# Patient Record
Sex: Male | Born: 1940
Health system: Western US, Academic
[De-identification: ages and names within clinical notes are randomized; demographics above are authoritative.]

## PROBLEM LIST (undated history)

## (undated) DIAGNOSIS — Z972 Presence of dental prosthetic device (complete) (partial): Secondary | ICD-10-CM

## (undated) DIAGNOSIS — R911 Solitary pulmonary nodule: Secondary | ICD-10-CM

## (undated) DIAGNOSIS — K7460 Unspecified cirrhosis of liver: Secondary | ICD-10-CM

## (undated) DIAGNOSIS — J449 Chronic obstructive pulmonary disease, unspecified: Secondary | ICD-10-CM

## (undated) DIAGNOSIS — F419 Anxiety disorder, unspecified: Secondary | ICD-10-CM

## (undated) DIAGNOSIS — F418 Other specified anxiety disorders: Secondary | ICD-10-CM

## (undated) DIAGNOSIS — M1990 Unspecified osteoarthritis, unspecified site: Secondary | ICD-10-CM

## (undated) DIAGNOSIS — C801 Malignant (primary) neoplasm, unspecified: Secondary | ICD-10-CM

## (undated) DIAGNOSIS — E785 Hyperlipidemia, unspecified: Secondary | ICD-10-CM

## (undated) DIAGNOSIS — Z973 Presence of spectacles and contact lenses: Secondary | ICD-10-CM

## (undated) DIAGNOSIS — F1010 Alcohol abuse, uncomplicated: Secondary | ICD-10-CM

## (undated) DIAGNOSIS — I10 Essential (primary) hypertension: Secondary | ICD-10-CM

## (undated) HISTORY — DX: Other specified anxiety disorders: F41.8

## (undated) HISTORY — PX: OTHER SURGICAL HISTORY: SHX169

## (undated) HISTORY — DX: Hyperlipidemia, unspecified: E78.5

## (undated) HISTORY — PX: KNEE SURGERY: SHX244

## (undated) HISTORY — DX: Unspecified cirrhosis of liver: K74.60

## (undated) HISTORY — PX: SKULL FRACTURE ELEVATION: SHX781

## (undated) HISTORY — PX: INGUINAL HERNIA REPAIR: SUR1180

## (undated) HISTORY — DX: Alcohol abuse, uncomplicated: F10.10

## (undated) HISTORY — PX: WISDOM TOOTH EXTRACTION: SHX21

## (undated) HISTORY — PX: EXTERNAL EAR SURGERY: SHX627

## (undated) HISTORY — DX: Chronic obstructive pulmonary disease, unspecified: J44.9

---

## 1994-02-27 HISTORY — PX: COLONOSCOPY: SHX174

## 2003-10-01 ENCOUNTER — Ambulatory Visit (HOSPITAL_COMMUNITY): Admission: RE | Admit: 2003-10-01 | Discharge: 2003-10-01 | Payer: Self-pay | Admitting: Family Medicine

## 2004-07-18 ENCOUNTER — Ambulatory Visit (HOSPITAL_COMMUNITY): Admission: RE | Admit: 2004-07-18 | Discharge: 2004-07-18 | Payer: Self-pay | Admitting: Family Medicine

## 2004-07-22 ENCOUNTER — Ambulatory Visit (HOSPITAL_COMMUNITY): Admission: RE | Admit: 2004-07-22 | Discharge: 2004-07-22 | Payer: Self-pay | Admitting: Family Medicine

## 2004-09-26 ENCOUNTER — Ambulatory Visit (HOSPITAL_COMMUNITY): Admission: RE | Admit: 2004-09-26 | Discharge: 2004-09-26 | Payer: Self-pay | Admitting: Family Medicine

## 2006-04-29 ENCOUNTER — Emergency Department (HOSPITAL_COMMUNITY): Admission: EM | Admit: 2006-04-29 | Discharge: 2006-04-29 | Payer: Self-pay | Admitting: Emergency Medicine

## 2006-05-02 ENCOUNTER — Ambulatory Visit: Payer: Self-pay | Admitting: Orthopedic Surgery

## 2006-05-04 ENCOUNTER — Ambulatory Visit (HOSPITAL_COMMUNITY): Admission: RE | Admit: 2006-05-04 | Discharge: 2006-05-04 | Payer: Self-pay | Admitting: Orthopedic Surgery

## 2006-05-04 ENCOUNTER — Ambulatory Visit: Payer: Self-pay | Admitting: Orthopedic Surgery

## 2006-05-08 ENCOUNTER — Ambulatory Visit: Payer: Self-pay | Admitting: Orthopedic Surgery

## 2006-05-29 ENCOUNTER — Ambulatory Visit: Payer: Self-pay | Admitting: Orthopedic Surgery

## 2006-06-19 ENCOUNTER — Ambulatory Visit: Payer: Self-pay | Admitting: Orthopedic Surgery

## 2006-07-02 ENCOUNTER — Ambulatory Visit: Payer: Self-pay | Admitting: Orthopedic Surgery

## 2006-08-02 ENCOUNTER — Ambulatory Visit: Payer: Self-pay | Admitting: Orthopedic Surgery

## 2006-09-28 ENCOUNTER — Ambulatory Visit (HOSPITAL_COMMUNITY): Admission: RE | Admit: 2006-09-28 | Discharge: 2006-09-28 | Payer: Self-pay | Admitting: Family Medicine

## 2006-11-23 ENCOUNTER — Ambulatory Visit (HOSPITAL_COMMUNITY): Admission: RE | Admit: 2006-11-23 | Discharge: 2006-11-23 | Payer: Self-pay | Admitting: Otolaryngology

## 2008-01-27 ENCOUNTER — Ambulatory Visit (HOSPITAL_COMMUNITY): Admission: RE | Admit: 2008-01-27 | Discharge: 2008-01-27 | Payer: Self-pay | Admitting: Family Medicine

## 2008-01-27 ENCOUNTER — Encounter: Payer: Self-pay | Admitting: Orthopedic Surgery

## 2008-02-10 ENCOUNTER — Ambulatory Visit: Payer: Self-pay | Admitting: Orthopedic Surgery

## 2008-02-10 DIAGNOSIS — M25469 Effusion, unspecified knee: Secondary | ICD-10-CM | POA: Insufficient documentation

## 2008-02-10 DIAGNOSIS — M25569 Pain in unspecified knee: Secondary | ICD-10-CM | POA: Insufficient documentation

## 2008-03-02 ENCOUNTER — Ambulatory Visit: Payer: Self-pay | Admitting: Orthopedic Surgery

## 2008-03-03 ENCOUNTER — Encounter (HOSPITAL_COMMUNITY): Admission: RE | Admit: 2008-03-03 | Discharge: 2008-03-13 | Payer: Self-pay | Admitting: Orthopedic Surgery

## 2008-03-03 ENCOUNTER — Encounter: Payer: Self-pay | Admitting: Orthopedic Surgery

## 2008-03-13 ENCOUNTER — Encounter: Payer: Self-pay | Admitting: Orthopedic Surgery

## 2008-03-18 ENCOUNTER — Ambulatory Visit: Payer: Self-pay | Admitting: Orthopedic Surgery

## 2009-02-22 ENCOUNTER — Ambulatory Visit (HOSPITAL_COMMUNITY): Admission: RE | Admit: 2009-02-22 | Discharge: 2009-02-22 | Payer: Self-pay | Admitting: Family Medicine

## 2009-03-04 ENCOUNTER — Ambulatory Visit (HOSPITAL_COMMUNITY): Admission: RE | Admit: 2009-03-04 | Discharge: 2009-03-04 | Payer: Self-pay | Admitting: Family Medicine

## 2009-03-09 ENCOUNTER — Ambulatory Visit (HOSPITAL_COMMUNITY): Admission: RE | Admit: 2009-03-09 | Discharge: 2009-03-09 | Payer: Self-pay | Admitting: Family Medicine

## 2009-03-12 ENCOUNTER — Ambulatory Visit (HOSPITAL_COMMUNITY): Admission: RE | Admit: 2009-03-12 | Discharge: 2009-03-12 | Payer: Self-pay | Admitting: Family Medicine

## 2009-03-24 ENCOUNTER — Observation Stay (HOSPITAL_COMMUNITY): Admission: RE | Admit: 2009-03-24 | Discharge: 2009-03-25 | Payer: Self-pay | Admitting: General Surgery

## 2009-03-24 ENCOUNTER — Encounter (INDEPENDENT_AMBULATORY_CARE_PROVIDER_SITE_OTHER): Payer: Self-pay | Admitting: General Surgery

## 2010-03-24 ENCOUNTER — Emergency Department (HOSPITAL_COMMUNITY)
Admission: EM | Admit: 2010-03-24 | Discharge: 2010-03-24 | Payer: Self-pay | Source: Home / Self Care | Admitting: Emergency Medicine

## 2010-03-30 NOTE — Miscellaneous (Signed)
Summary: Rehab Report  Rehab Report   Imported By: Elvera Maria 03/20/2008 12:17:00  _____________________________________________________________________  External Attachment:    Type:   Image     Comment:   progress

## 2010-03-30 NOTE — Letter (Signed)
Summary: Out of Work  [facility]  [address]  [place], [place] [address]   Phone: [phone]  Fax: [phone]    February 10, 2008   Employee:  DIRCK BUTCH    To Whom It May Concern:   For Medical reasons, please excuse the above named employee from work for the following dates:  Start:   01/24/2008  End:    03/03/2008  If you need additional information, please feel free to contact our office.         Sincerely,    Terrance Mass, MD

## 2010-03-30 NOTE — Op Note (Signed)
Summary: Op Rpt LTdistal radius fx,Closed reduction, perc pinning, app of  Op Rpt LTdistal radius fx,Closed reduction, perc pinning, app of short-arm splint   Imported By: Cammie Sickle 02/08/2008 21:21:18  _____________________________________________________________________  External Attachment:    Type:   Image     Comment:   External Document

## 2010-03-30 NOTE — Letter (Signed)
Summary: Out of Work  [facility]  [address]  [place], [place] [address]   Phone: [phone]  Fax: [phone]    March 18, 2008   Employee:  JESTON JUNKINS    To Whom It May Concern:   The above named patient may return to work - full duty 03/30/08  If you need further information,please feel free to call our office.      Sincerely,        Dr. [name]

## 2010-03-30 NOTE — Letter (Signed)
Summary: Out of Work  [facility]  [address]  [place], [place] [address]   Phone: [phone]  Fax: [phone]    March 02, 2008   Employee:  Ian Aguilar    To Whom It May Concern:   For Medical reasons, please excuse the above named employee from work for the following dates:  Start:   March 02, 2008   End:   March 18, 2008   Next Scheduled appointment:   March 18, 2008  If you need additional information, please feel free to contact our office.         Sincerely,    Terrance Mass, MD

## 2010-03-30 NOTE — Assessment & Plan Note (Signed)
Summary: 2 WK RECK RT KNEE FOL'G PT RE:RET WORK/MEDICARE,MAILH/CAF    History of Present Illness: I saw Ian Aguilar in the office today for a 2 week  followup visit.  He is a 70 years old man with the complaint of:  right knee.  Patient states his knee is much better.  History:  I saw Ian Aguilar in the office today for a followup visit.  He is a 70 years old man with the complaint of:  return to recheck on the right knee after injection and bracing, we took out of work until 03/03/08.  Fall on 01/24/08.  Contusion of patella.  Still has some tenderness, swelling has went down.  Does not feel like he is ready to return to work, has to work on concrete 10 hrs a day in the cold.  Xrays 01/27/08 of the rt knee [facility].  He apparently fell on  Thanksgiving night when he fell onto his RIGHT knee. He thinks he may have been sleepwalking. He initially had some swelling which has since subsided. He has medial knee pain with associated popping. He has no locking but has pain ascending and descending stairs. The pain is nonradiating and is relieved with some ibuprofen.  OOW since 11/27th        Current Allergies: No known allergies       Physical Exam   The general apperance was normal. The pulses and perfusion were normal with normal color, temperature  and no swelling The coordination and sensation were normal  The reflexes were normal  The knee looks great. Full extension excellent straight leg raise mild quad atrophy. Knee stable.    Impression & Recommendations:  Problem # 1:  JOINT EFFUSION, KNEE (ICD-719.06) Assessment: Improved recommend 2 weeks of rehabilitation and then return to work February 1 follow up as needed after that Orders: Est. Patient Level III ([address])   Problem # 2:  KNEE PAIN (UEA-540.98) Assessment: Improved  Orders: Est. Patient Level III (11914)    Patient Instructions: 1)  Go back to work 03/30/08. 2)  Come back as needed.

## 2010-03-30 NOTE — Progress Notes (Signed)
Summary: Initial Eval  Initial Eval   Imported By: Cammie Sickle 02/08/2008 21:19:38  _____________________________________________________________________  External Attachment:    Type:   Image     Comment:   External Document

## 2010-03-30 NOTE — Assessment & Plan Note (Signed)
Summary: rt knee pain/xrays [email or web address]    History of Present Illness: I saw Ian Aguilar in the office today for a followup visit.  He has a new problem.  He is a 70 years old man with the complaint of:  new problem rt knee pain, consult [facility].  Xrays 01/27/08 of the rt knee [facility].  He apparently fell on  Thanksgiving night when he fell onto his RIGHT knee. He thinks he may have been sleepwalking. He initially had some swelling which has since subsided. He has medial knee pain with associated popping. He has no locking but has pain ascending and descending stairs. The pain is nonradiating and is relieved with some ibuprofen.  OOW since 11/27th        Current Allergies: No known allergies   Past Medical History:    Reviewed history from 02/07/2008 and no changes required:       Unsteady walking       Vertigo       Seasonal allergies       Cough  Past Surgical History:    Reviewed history from 02/07/2008 and no changes required:       Broken cheek 1967       Ear surgery 1976       LEFT Wrist surgery: closed reduction percutaneous pinning   Social History:    JOB: prefinish hardwood flooring     Review of Systems  MS      fracture LEFT wrist treated with internal fixation K wires and closed reduction; doing well    Knee Exam  General:    Well-developed, well-nourished, normal body habitus; no deformities, normal grooming.  Gait:    Normal heel-toe gait pattern bilaterally.    Skin:    Intact, no scars, lesions, rashes, cafe au lait spots, or bruising.    Inspection:    swelling:RIGHT knee  Palpation:    tenderness over the medial joint line and the medial parapatellar region  Vascular:    There was no swelling or varicose veins. The pulses and temperature are normal. There was no edema or tenderness.  Sensory:    Gross coordination and sensation were normal.    Motor:    Motor strength 5/5 bilaterally for quadriceps,  hamstrings, ankle dorsiflexion, and ankle plantar flexion.    Reflexes:    Normal and symmetric patellar and Achilles reflexes bilaterally.    Knee Exam:    Right:    Inspection:  Abnormal    Palpation:  Abnormal    Stability:  stable     His range of motion was 5 - 125; the meniscal sign was equivocal    Impression & Recommendations:  Problem # 1:  KNEE PAIN (ICD-719.46) The x-rays were done at [facility]. The report and the films have been reviewed. there is a joint effusion there is mild degenerative arthritis especially on the medial side  Impression osteoarthritis with joint effusion   The RIGHT knee Verbal consent was obtained. The knee was prepped with alcohol and ethyl chloride. 1 cc of depomedrol 40mg /cc and 4 cc of lidocaine 1% was injected. there were no complications. aspiration of 20 cc of blood-tinged fluid prior to injection  I want him to apply a RIGHT knee sleeve for compression.  This was most likely a patellar contusion Orders: Est. Patient Level IV ([address]) Depo- Medrol 40mg  (J1030) Joint Aspirate / Injection, Large (20610)   Problem # 2:  JOINT EFFUSION, KNEE (ICD-719.06)  Orders: Est. Patient Level IV (60454) Depo-  Medrol 40mg  (J1030) Joint Aspirate / Injection, Large (20610)    Patient Instructions: 1)  You have received an injection of cortisone today. You may experience increased pain at the injection site. Apply ice pack to the area for 20 minutes every 2 hours and take 2 xtra strength tylenol every 8 hours. This increased pain will usually resolve in 24 hours. The injection will take effect in 3-10 days.  2)  Wear the knee brace for daily activity x 4 weeks  3)  DX: Knee Effusion and Patella contusion 4)  OOW 01/24/08 to Mar 02 2008 5)  RTW Jan 5 th 2010   ]

## 2010-03-30 NOTE — Assessment & Plan Note (Signed)
Summary: RE-CK RT KNEE RE: RET TO WORK/MEDICARE,MAILH/CAF    History of Present Illness: I saw Ian Aguilar in the office today for a followup visit.  He is a 70 years old man with the complaint of:  return to recheck on the right knee after injection and bracing, we took out of work until 03/03/08.  Fall on 01/24/08.  Contusion of patella.  Still has some tenderness, swelling has went down.  Does not feel like he is ready to return to work, has to work on concrete 10 hrs a day in the cold.  Xrays 01/27/08 of the rt knee [facility].  He apparently fell on  Thanksgiving night when he fell onto his RIGHT knee. He thinks he may have been sleepwalking. He initially had some swelling which has since subsided. He has medial knee pain with associated popping. He has no locking but has pain ascending and descending stairs. The pain is nonradiating and is relieved with some ibuprofen.  OOW since 11/27th        Current Allergies: No known allergies       Physical Exam  Msk:     normal  Pulses:     pulses normal in all 4 extremities Extremities:     the RIGHT knee is weak compared to the LEFT there is poor quadriceps extension and poor straight leg raise, stability normal, range of motion normal. No joint effusion. Neurologic:     normal sensation    Impression & Recommendations:  Problem # 1:  JOINT EFFUSION, KNEE (ICD-719.06) Assessment: Unchanged  Orders: Physical Therapy Referral (PT) Est. Patient Level III (57846)   Problem # 2:  KNEE PAIN (NGE-952.84) Assessment: Unchanged  Orders: Physical Therapy Referral (PT) Est. Patient Level III (13244)  she should have some physical therapy to strengthen his knee 4 days a week for 2 weeks and return to stay out of work for now.   Patient Instructions: 1)  OOW for 2 more weeks 2)  PT 4 times a week for 2 weeks. 3)  Come back in 2 weeks for recheck of the right knee.   ]

## 2010-03-30 NOTE — Miscellaneous (Signed)
Summary: PT Initial eval  PT Initial eval   Imported By: Cammie Sickle 03/13/2008 19:01:20  _____________________________________________________________________  External Attachment:    Type:   Image     Comment:   External Document

## 2010-04-01 NOTE — Progress Notes (Signed)
Summary: Office Visit 07/02/06  Office Visit 07/02/06   Imported By: Cammie Sickle 02/08/2008 21:21:59  _____________________________________________________________________  External Attachment:    Type:   Image     Comment:   External Document

## 2010-05-15 LAB — BASIC METABOLIC PANEL
BUN: 16 mg/dL (ref 6–23)
CO2: 30 mEq/L (ref 19–32)
Calcium: 8.8 mg/dL (ref 8.4–10.5)
Chloride: 98 mEq/L (ref 96–112)
Creatinine, Ser: 0.59 mg/dL (ref 0.4–1.5)
GFR calc Af Amer: >60 mL/min (ref >60)
GFR calc non Af Amer: >60 mL/min (ref >60)
Glucose, Bld: 98 mg/dL (ref 70–99)
Potassium: 4.1 mEq/L (ref 3.5–5.1)
Sodium: 131 mEq/L — ABNORMAL LOW (ref 135–145)

## 2010-05-15 LAB — HEPATIC FUNCTION PANEL
ALT: 60 U/L — ABNORMAL HIGH (ref 0–53)
AST: 81 U/L — ABNORMAL HIGH (ref 0–37)
Albumin: 3.1 g/dL — ABNORMAL LOW (ref 3.5–5.2)
Alkaline Phosphatase: 118 U/L — ABNORMAL HIGH (ref 39–117)
Bilirubin, Direct: 0.3 mg/dL (ref 0.0–0.3)
Indirect Bilirubin: 0.7 mg/dL (ref 0.3–0.9)
Total Bilirubin: 1 mg/dL (ref 0.3–1.2)
Total Protein: 5.9 g/dL — ABNORMAL LOW (ref 6.0–8.3)

## 2010-05-15 LAB — CBC
HCT: 40 % (ref 39.0–52.0)
Hemoglobin: 13.9 g/dL (ref 13.0–17.0)
MCHC: 34.7 g/dL (ref 30.0–36.0)
MCV: 102 fL — ABNORMAL HIGH (ref 78.0–100.0)
Platelets: 176 10*3/uL (ref 150–400)
RBC: 3.93 MIL/uL — ABNORMAL LOW (ref 4.22–5.81)
RDW: 15.1 % (ref 11.5–15.5)
WBC: 5.9 10*3/uL (ref 4.0–10.5)

## 2010-05-15 LAB — DIFFERENTIAL
Basophils Absolute: 0 10*3/uL (ref 0.0–0.1)
Basophils Relative: 0 % (ref 0–1)
Eosinophils Absolute: 0 10*3/uL (ref 0.0–0.7)
Eosinophils Relative: 1 % (ref 0–5)
Lymphocytes Relative: 22 % (ref 12–46)
Lymphs Abs: 1.3 10*3/uL (ref 0.7–4.0)
Monocytes Absolute: 0.4 10*3/uL (ref 0.1–1.0)
Monocytes Relative: 7 % (ref 3–12)
Neutro Abs: 4.1 10*3/uL (ref 1.7–7.7)
Neutrophils Relative %: 70 % (ref 43–77)

## 2010-05-15 LAB — APTT: aPTT: 28 seconds (ref 24–37)

## 2010-05-15 LAB — PROTIME-INR
INR: 1.07 (ref 0.00–1.49)
Prothrombin Time: 13.8 seconds (ref 11.6–15.2)

## 2010-05-15 LAB — BLEEDING TIME: Bleeding Time: 5 minutes (ref 2.5–9.5)

## 2010-07-15 NOTE — Op Note (Signed)
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  Radiographs revealed a distal radius fracture with dorsal angulation.  It was recommended that he undergo closed reduction percutaneous  pinning.  Informed consent process was completed.   PREOPERATIVE DIAGNOSIS:  Closed left distal radius fracture.   POSTOPERATIVE DIAGNOSIS:  Closed left distal radius fracture.   PROCEDURE:  Closed reduction, percutaneous pinning, application of short-  arm splint.   SURGEON:  Dr. Romeo Apple, no assistants.   ANESTHETIC:  General.   No blood loss or specimen.   COMPLICATIONS:  None.   Counts were correct.  The patient to PACU in good condition.   PROCEDURE DETAILS:  The patient was identified as [name].  Left  wrist was marked for surgery, countersigned by the surgeon.  History and  physical was updated.  The patient was taken to the operating room for  general anesthesia.  Antibiotics were started.  Left upper extremity was  prepped and draped using sterile technique.   Time-out procedure was completed.  Limb site was confirmed.  Antibiotics  confirmed to be within an hour of skin incision.   Closed manipulation was attempted unsuccessfully.  The patient was  placed in traction and then second manipulation reduced the fracture.  Four K-wires were placed, two 4.5 and two 6.2.  These were buried under  the skin to be removed at later time.  Radiographs confirmed an  excellent reduction.  The patient was placed in a volar splint,  extubated and taken to recovery  room in stable condition.      Vickki Hearing, M.D.  Electronically Signed     [facility]/MEDQ  D:  05/04/2006  T:  05/04/2006  Job:  [id number]

## 2011-06-07 ENCOUNTER — Telehealth: Payer: Self-pay

## 2011-06-07 ENCOUNTER — Other Ambulatory Visit: Payer: Self-pay

## 2011-06-07 DIAGNOSIS — Z139 Encounter for screening, unspecified: Secondary | ICD-10-CM

## 2011-06-07 NOTE — Telephone Encounter (Signed)
Needs ov to determine means of sedation.

## 2011-06-07 NOTE — Telephone Encounter (Signed)
Previous old report where Dr. Karilyn Cota did his colonoscopy 10/17/94 indicated he drank alcohol. i called pt and he said he does drink 6-8 beers daily.

## 2011-06-07 NOTE — Telephone Encounter (Signed)
LM for pt to returncall

## 2011-06-07 NOTE — Telephone Encounter (Signed)
Gastroenterology Pre-Procedure Form   Request Date: 06/07/2011      Requesting Physician: Dr. Phillips Odor     PATIENT INFORMATION:  Ian Aguilar is a 71 y.o., male (DOB=1940/07/08).  PROCEDURE: Procedure(s) requested: colonoscopy Procedure Reason: screening for colon cancer  PATIENT REVIEW QUESTIONS: The patient reports the following:   1. Diabetes Melitis: no 2. Joint replacements in the past 12 months: no 3. Major health problems in the past 3 months: no 4. Has an artificial valve or MVP:no 5. Has been advised in past to take antibiotics in advance of a procedure like teeth cleaning: no}    MEDICATIONS & ALLERGIES:    Patient reports the following regarding taking any blood thinners:   Plavix? no Aspirin?no Coumadin?  no  Patient confirms/reports the following medications:  Current Outpatient Prescriptions  Medication Sig Dispense Refill  . ALPRAZolam (XANAX) 0.5 MG tablet Take 0.5 mg by mouth at bedtime as needed. Pt takes 1/2 tablet most nights      . buPROPion (WELLBUTRIN XL) 150 MG 24 hr tablet Take 150 mg by mouth 1 day or 1 dose.      . ibuprofen (ADVIL,MOTRIN) 200 MG tablet Take 200 mg by mouth 1 day or 1 dose.      . tiotropium (SPIRIVA) 18 MCG inhalation capsule Place 18 mcg into inhaler and inhale daily.        Patient confirms/reports the following allergies:  No Known Allergies  Patient is appropriate to schedule for requested procedure(s): yes  AUTHORIZATION INFORMATION Primary Insurance:   ID #:  Group #:  Pre-Cert / Auth required:  Pre-Cert / Auth #:   Secondary Insurance:   ID #:   Group #:  Pre-Cert / Auth required: Pre-Cert / Auth #:   No orders of the defined types were placed in this encounter.    SCHEDULE INFORMATION: Procedure has been scheduled as follows:  Date: 06/28/2011        Time: 10:00 AM Location: [facility] Short Stay  This Gastroenterology Pre-Precedure Form is being routed to the following provider(s) for review: R.  Roetta Sessions, MD

## 2011-06-08 NOTE — Telephone Encounter (Signed)
LMOM for Ian Aguilar. Cancel appt on 06/28/2011 for the colonoscopy.

## 2011-06-08 NOTE — Telephone Encounter (Signed)
Called and spoke with wife. OV scheduled for 06/12/2011 @ 2:00 PM with Gerrit Halls, NP.

## 2011-06-12 ENCOUNTER — Ambulatory Visit (INDEPENDENT_AMBULATORY_CARE_PROVIDER_SITE_OTHER): Payer: Medicare Other | Admitting: Gastroenterology

## 2011-06-12 ENCOUNTER — Encounter: Payer: Self-pay | Admitting: Gastroenterology

## 2011-06-12 DIAGNOSIS — R160 Hepatomegaly, not elsewhere classified: Secondary | ICD-10-CM

## 2011-06-12 DIAGNOSIS — Z1211 Encounter for screening for malignant neoplasm of colon: Secondary | ICD-10-CM

## 2011-06-12 MED ORDER — PEG-KCL-NACL-NASULF-NA ASC-C 100 G PO SOLR: 1.0000 | Freq: Once | ORAL | Status: DC

## 2011-06-12 NOTE — Progress Notes (Signed)
 Primary Care Physician:  GOLDING,JOHN CABOT, MD, MD Primary Gastroenterologist:  Dr. Rourk   Chief Complaint  Patient presents with  . Colonoscopy    HPI:   71-year-old male who presents today at the request of Dr. Golding for a screening TCS. Apparently last one performed by Dr. Rehman in 1996, do not have reports at time of visit. Denies any abdominal pain, N/V, no upper GI symptoms. No blood in stool. No constipation, diarrhea. No wt loss or lack of appetite.   Upon review of office note from Dr. Golding, appears patient may have hx of cirrhosis. However, he states he is unaware of this. States was told his "liver was big".   Dec 2010 US with hepatomegaly.  Aug 2008 CT: enlarged lobular liver, nonspecific, can be seen with cirrhosis and other etiologies, no focal mass lesion identified. Appeared relatively stable from prior CT of 2005.  I do not have current labs at time of visit. Appears Dr. Golding ordered routine labs to include CBC, CMP. Will need to request these.   Last HFP on file from 2011 with AST 81, ALT 60, AP 118.    Past Medical History  Diagnosis Date  . COPD (chronic obstructive pulmonary disease)   . ETOH abuse   . Cirrhosis     pt unsure  . Hyperlipidemia   . Depression with anxiety     Past Surgical History  Procedure Date  . Knee surgery     left  . Inguinal hernia repair   . Skull fracture elevation   . Broken arm   . External ear surgery   . Wisdom tooth extraction   . Bilateral cataract surgery     Current Outpatient Prescriptions  Medication Sig Dispense Refill  . ALPRAZolam (XANAX) 0.5 MG tablet Take 0.5 mg by mouth at bedtime as needed. Pt takes 1/2 tablet most nights      . buPROPion (WELLBUTRIN XL) 150 MG 24 hr tablet Take 150 mg by mouth 1 day or 1 dose.      . ibuprofen (ADVIL,MOTRIN) 200 MG tablet Take 200 mg by mouth 1 day or 1 dose.      . tiotropium (SPIRIVA) 18 MCG inhalation capsule Place 18 mcg into inhaler and inhale daily.       . peg 3350 powder (MOVIPREP) SOLR Take 1 kit (100 g total) by mouth once. As directed Please purchase 1 Fleets enema to use with the prep  1 kit  0    Allergies as of 06/12/2011  . (No Known Allergies)    Family History  Problem Relation Age of Onset  . Colon cancer Neg Hx     History   Social History  . Marital Status: Married    Spouse Name: N/A    Number of Children: N/A  . Years of Education: N/A   Occupational History  . retired    Social History Main Topics  . Smoking status: Current Everyday Smoker -- 0.5 packs/day    Types: Cigarettes  . Smokeless tobacco: Not on file   Comment: 4-10 a day  . Alcohol Use: Yes      beer only. 6-8 bottles per day.   . Drug Use: No  . Sexually Active: Not on file   Other Topics Concern  . Not on file   Social History Narrative  . No narrative on file    Review of Systems: Gen: Denies any fever, chills, fatigue, weight loss, lack of appetite.  CV: Denies chest   pain, heart palpitations, peripheral edema, syncope.  Resp: Denies shortness of breath at rest or with exertion. Denies wheezing or cough.  GI: Denies dysphagia or odynophagia. Denies jaundice, hematemesis, fecal incontinence. GU : Denies urinary burning, urinary frequency, urinary hesitancy MS: Denies joint pain, muscle weakness, cramps, or limitation of movement.  Derm: Denies rash, itching, dry skin Psych: Denies depression, anxiety, memory loss, and confusion Heme: Denies bruising, bleeding, and enlarged lymph nodes.  Physical Exam: BP 164/77  Pulse 81  Temp(Src) 98.3 F (36.8 C) (Temporal)  Ht 5' 11" (1.803 m)  Wt 162 lb (73.483 kg)  BMI 22.59 kg/m2 General:   Alert and oriented. Pleasant and cooperative. Well-nourished and well-developed.  Head:  Normocephalic and atraumatic. Eyes:  Without icterus, sclera clear and conjunctiva pink.  Ears:  Normal auditory acuity. Nose:  No deformity, discharge,  or lesions. Mouth:  No deformity or lesions, oral  mucosa pink.  Neck:  Supple, without mass or thyromegaly. Lungs:  Clear to auscultation bilaterally. No wheezes, rales, or rhonchi. No distress.  Heart:  S1, S2 present without murmurs appreciated.  Abdomen:  +BS, soft, non-tender and non-distended. Liver border easily palpable. No guarding or rebound. No masses appreciated.  Rectal:  Deferred  Msk:  Symmetrical without gross deformities. Normal posture. Extremities:  Without clubbing or edema. Neurologic:  Alert and  oriented x4;  grossly normal neurologically. Skin:  Intact without significant lesions or rashes. Cervical Nodes:  No significant cervical adenopathy. Psych:  Alert and cooperative. Normal mood and affect.    

## 2011-06-12 NOTE — Patient Instructions (Signed)
We have set you up for an ultrasound of your belly due to your enlarged liver. It is very important that you cut back on drinking, but it is even better if you quit altogether :)  We will be calling you once we review the labs from Dr. Phillips Odor. We most likely will need more blood work.   We have set you up for a colonoscopy and possibly an upper endoscopy with Dr. Jena Gauss in the very near future.  Please call us if you have any questions in the meantime.

## 2011-06-13 ENCOUNTER — Ambulatory Visit (HOSPITAL_COMMUNITY)
Admission: RE | Admit: 2011-06-13 | Discharge: 2011-06-13 | Disposition: A | Discharge status: Home / Self Care | Payer: Medicare Other | Source: Ambulatory Visit | Attending: Gastroenterology | Admitting: Gastroenterology

## 2011-06-13 DIAGNOSIS — R160 Hepatomegaly, not elsewhere classified: Secondary | ICD-10-CM | POA: Insufficient documentation

## 2011-06-14 ENCOUNTER — Encounter: Payer: Self-pay | Admitting: Gastroenterology

## 2011-06-14 DIAGNOSIS — R160 Hepatomegaly, not elsewhere classified: Secondary | ICD-10-CM | POA: Insufficient documentation

## 2011-06-14 NOTE — Assessment & Plan Note (Addendum)
Palpable liver on exam. Pt with unclear history of cirrhosis, yet referring notes document this in office visit. Review of radiology procedures in past with [place] of abd 2010 with hepatomegaly, CT 2008 with enlarged lobular liver, question of cirrhosis. Last LFTs on file through epic in 2011 as mentioned above, AST, ALT, AP mildly elevated. Question evolving/early cirrhosis secondary to ETOH. Unable to exclude other factors at this time. Need labs from Dr. Phillips Odor, updated [place] of abdomen, consider EGD at time of TCS if findings of cirrhosis on [place]. I briefly discussed in detail with patient the need for further investigation. Highly anticipate further work-up in near future after review of blood work and [place]. Discussed possibly adding EGD at time of TCS to evaluate for esophageal varices. Pt states understanding.   ADDENDUM 4/30: CIRRHOSIS NOTED ON CT, UNCLEAR ETIOLOGY. ALSO NOTED 2 AREAS IN PANCREAS THAT NEED FURTHER EVALUATION WITH MRI. PT WILL BE SCHEDULED TO FOLLOW-UP WITH [place] NEXT WEEK TO FURTHER WORK-UP CIRRHOSIS, DECIDE MODALITY WITH WHICH TO EVALUATE PANCREAS.   ~Proceed with upper endoscopy at time of TCS with Dr. Jena Gauss to assess for esophageal varices. The risks, benefits, and alternatives have been discussed in detail with patient. They have stated understanding and desire to proceed.  Phenergan 12.5 mg on call.

## 2011-06-14 NOTE — Assessment & Plan Note (Addendum)
71 year old male with need for updated TCS. Last performed in the 1990s with Dr. Karilyn Cota, need op notes. No lower GI symptoms. No rectal bleeding or abdominal pain. Drinks 6-8 beers per day; therefore, we will facilitate sedation with modified dose of Phenergan 12.5 mg due to age at time of procedure.   Proceed with TCS with Dr. Jena Gauss in near future: the risks, benefits, and alternatives have been discussed with the patient in detail. The patient states understanding and desires to proceed. Phenergan 12.5 mg ON CALL Retrieve prior TCS for records (Received op notes from 1996, Dr. Karilyn Cota, external hemorrhoids otherwise normal. )

## 2011-06-15 NOTE — Progress Notes (Signed)
Faxed to PCP

## 2011-06-19 ENCOUNTER — Telehealth: Payer: Self-pay | Admitting: Gastroenterology

## 2011-06-19 ENCOUNTER — Encounter: Payer: Self-pay | Admitting: Gastroenterology

## 2011-06-19 ENCOUNTER — Other Ambulatory Visit: Payer: Self-pay | Admitting: Gastroenterology

## 2011-06-19 MED ORDER — PEG 3350-KCL-NA BICARB-NACL 420 G PO SOLR: ORAL | Status: AC

## 2011-06-19 NOTE — Telephone Encounter (Signed)
Received fax from [facility] requesting a less expensive prep for the pt- Prep changed to [facility]- Rx abd instructions faxed to [facility] [place]

## 2011-06-20 ENCOUNTER — Encounter: Payer: Self-pay | Admitting: Gastroenterology

## 2011-06-20 NOTE — Progress Notes (Signed)
Received op notes from 1996, Dr. Karilyn Cota, external hemorrhoids otherwise normal.

## 2011-06-23 NOTE — Progress Notes (Signed)
Quick Note:  Question chronic liver disease.  Let's get CT abd/pelvis Needs Creatinine prior. Let's try to get this several days prior to TCS (May 1st) if possible. May need EGD to assess for varices at time of TCS if +cirrhosis. ______

## 2011-06-26 ENCOUNTER — Other Ambulatory Visit: Payer: Self-pay | Admitting: Gastroenterology

## 2011-06-26 ENCOUNTER — Telehealth: Payer: Self-pay | Admitting: Gastroenterology

## 2011-06-26 ENCOUNTER — Encounter: Payer: Self-pay | Admitting: Internal Medicine

## 2011-06-26 DIAGNOSIS — K769 Liver disease, unspecified: Secondary | ICD-10-CM

## 2011-06-26 NOTE — Telephone Encounter (Signed)
Pt called back he is aware of the CT - to get blood work and pick up contrast this afternoon

## 2011-06-27 ENCOUNTER — Ambulatory Visit (HOSPITAL_COMMUNITY)
Admission: RE | Admit: 2011-06-27 | Discharge: 2011-06-27 | Disposition: A | Discharge status: Home / Self Care | Payer: Medicare Other | Source: Ambulatory Visit | Attending: Gastroenterology | Admitting: Gastroenterology

## 2011-06-27 ENCOUNTER — Encounter (HOSPITAL_COMMUNITY): Payer: Self-pay | Admitting: Pharmacy Technician

## 2011-06-27 DIAGNOSIS — R935 Abnormal findings on diagnostic imaging of other abdominal regions, including retroperitoneum: Secondary | ICD-10-CM | POA: Insufficient documentation

## 2011-06-27 DIAGNOSIS — K769 Liver disease, unspecified: Secondary | ICD-10-CM

## 2011-06-27 LAB — CREATININE, SERUM: Creat: 0.89 mg/dL (ref 0.50–1.35)

## 2011-06-27 MED ORDER — IOHEXOL 300 MG/ML  SOLN
100.0000 mL | Freq: Once | INTRAMUSCULAR | Status: AC | PRN
  Administered 2011-06-27: 100 mL via INTRAVENOUS

## 2011-06-27 NOTE — Progress Notes (Signed)
Quick Note:  He does have cirrhosis.  Unknown etiology at this point. No recent HFP to review.  Proceed with adding a definite EGD on to TCS on 5/1. (this was "possible" before, now definite).   Follow-up next week in our office to review labs needed and further evaluate cirrhosis.  Also, concern for 2 low density structures in head of pancreas. Will need to set up MRI likely. This will be discussed at his OV next week. Important he follows up for further evaluation. ______

## 2011-06-27 NOTE — Progress Notes (Signed)
Still awaiting recent labs from Dr. Phillips Odor. This will not interfere with his procedure 5/1.   However, we need to get these on file so we know what is left to order. Undergoing CT 4/30 to assess for cirrhosis. Notes from PCP elude to this. Will ultimately need to get on the 6 mos path for AFP, [place] if +cirrhosis. Needs HFP, CBC, BMP at minimum from Dr. Phillips Odor. If these are not available, will need to draw ourselves.

## 2011-06-27 NOTE — Progress Notes (Signed)
Faxed to PCP

## 2011-06-28 ENCOUNTER — Encounter (HOSPITAL_COMMUNITY): Payer: Self-pay | Admitting: *Deleted

## 2011-06-28 ENCOUNTER — Ambulatory Visit (HOSPITAL_COMMUNITY)
Admission: RE | Admit: 2011-06-28 | Discharge: 2011-06-28 | Disposition: A | Discharge status: Home / Self Care | Payer: Medicare Other | Source: Ambulatory Visit | Attending: Internal Medicine | Admitting: Internal Medicine

## 2011-06-28 ENCOUNTER — Encounter (HOSPITAL_COMMUNITY)
Admission: RE | Disposition: A | Discharge status: Home / Self Care | Payer: Self-pay | Source: Ambulatory Visit | Attending: Internal Medicine

## 2011-06-28 ENCOUNTER — Ambulatory Visit: Admit: 2011-06-28 | Payer: Self-pay | Admitting: Internal Medicine

## 2011-06-28 DIAGNOSIS — R160 Hepatomegaly, not elsewhere classified: Secondary | ICD-10-CM

## 2011-06-28 DIAGNOSIS — Z1211 Encounter for screening for malignant neoplasm of colon: Secondary | ICD-10-CM

## 2011-06-28 DIAGNOSIS — K2289 Other specified disease of esophagus: Secondary | ICD-10-CM

## 2011-06-28 DIAGNOSIS — D128 Benign neoplasm of rectum: Secondary | ICD-10-CM | POA: Insufficient documentation

## 2011-06-28 DIAGNOSIS — K228 Other specified diseases of esophagus: Secondary | ICD-10-CM

## 2011-06-28 DIAGNOSIS — J449 Chronic obstructive pulmonary disease, unspecified: Secondary | ICD-10-CM | POA: Insufficient documentation

## 2011-06-28 DIAGNOSIS — K5730 Diverticulosis of large intestine without perforation or abscess without bleeding: Secondary | ICD-10-CM

## 2011-06-28 DIAGNOSIS — E785 Hyperlipidemia, unspecified: Secondary | ICD-10-CM | POA: Insufficient documentation

## 2011-06-28 DIAGNOSIS — K449 Diaphragmatic hernia without obstruction or gangrene: Secondary | ICD-10-CM | POA: Insufficient documentation

## 2011-06-28 DIAGNOSIS — K769 Liver disease, unspecified: Secondary | ICD-10-CM | POA: Insufficient documentation

## 2011-06-28 DIAGNOSIS — D126 Benign neoplasm of colon, unspecified: Secondary | ICD-10-CM

## 2011-06-28 DIAGNOSIS — D129 Benign neoplasm of anus and anal canal: Secondary | ICD-10-CM

## 2011-06-28 DIAGNOSIS — K2940 Chronic atrophic gastritis without bleeding: Secondary | ICD-10-CM | POA: Insufficient documentation

## 2011-06-28 DIAGNOSIS — J4489 Other specified chronic obstructive pulmonary disease: Secondary | ICD-10-CM | POA: Insufficient documentation

## 2011-06-28 DIAGNOSIS — K2960 Other gastritis without bleeding: Secondary | ICD-10-CM

## 2011-06-28 HISTORY — PX: COLONOSCOPY: SHX5424

## 2011-06-28 HISTORY — PX: ESOPHAGOGASTRODUODENOSCOPY: SHX5428

## 2011-06-28 SURGERY — COLONOSCOPY
Anesthesia: Moderate Sedation

## 2011-06-28 MED ORDER — STERILE WATER FOR IRRIGATION IR SOLN
Status: DC | PRN
  Administered 2011-06-28: 13:00:00

## 2011-06-28 MED ORDER — MIDAZOLAM HCL 5 MG/5ML IJ SOLN
INTRAMUSCULAR | Status: DC | PRN
  Administered 2011-06-28: 2 mg via INTRAVENOUS
  Administered 2011-06-28: 1 mg via INTRAVENOUS

## 2011-06-28 MED ORDER — BUTAMBEN-TETRACAINE-BENZOCAINE 2-2-14 % EX AERO
INHALATION_SPRAY | CUTANEOUS | Status: DC | PRN
  Administered 2011-06-28: 1 via TOPICAL

## 2011-06-28 MED ORDER — MIDAZOLAM HCL 5 MG/5ML IJ SOLN
INTRAMUSCULAR | Status: AC
  Filled 2011-06-28: qty 10

## 2011-06-28 MED ORDER — PROMETHAZINE HCL 25 MG/ML IJ SOLN
12.5000 mg | Freq: Once | INTRAMUSCULAR | Status: AC
  Administered 2011-06-28: 12.5 mg via INTRAVENOUS

## 2011-06-28 MED ORDER — PROMETHAZINE HCL 25 MG/ML IJ SOLN
INTRAMUSCULAR | Status: AC
  Filled 2011-06-28: qty 1

## 2011-06-28 MED ORDER — SODIUM CHLORIDE 0.45 % IV SOLN
Freq: Once | INTRAVENOUS | Status: AC
  Administered 2011-06-28: 1000 mL via INTRAVENOUS

## 2011-06-28 MED ORDER — MEPERIDINE HCL 100 MG/ML IJ SOLN
INTRAMUSCULAR | Status: DC | PRN
  Administered 2011-06-28: 25 mg via INTRAVENOUS
  Administered 2011-06-28: 50 mg via INTRAVENOUS

## 2011-06-28 MED ORDER — MEPERIDINE HCL 100 MG/ML IJ SOLN
INTRAMUSCULAR | Status: AC
  Filled 2011-06-28: qty 2

## 2011-06-28 NOTE — H&P (View-Only) (Signed)
Primary Care Physician:  Colette Ribas, MD, MD Primary Gastroenterologist:  Dr. Jena Gauss   Chief Complaint  Patient presents with  . Colonoscopy    HPI:   71 year old male who presents today at the request of Dr. Phillips Odor for a screening TCS. Apparently last one performed by Dr. Karilyn Cota in 1996, do not have reports at time of visit. Denies any abdominal pain, N/V, no upper GI symptoms. No blood in stool. No constipation, diarrhea. No wt loss or lack of appetite.   Upon review of office note from Dr. Phillips Odor, appears patient may have hx of cirrhosis. However, he states he is unaware of this. States was told his "liver was big".   Dec 2010 [place] with hepatomegaly.  Aug 2008 CT: enlarged lobular liver, nonspecific, can be seen with cirrhosis and other etiologies, no focal mass lesion identified. Appeared relatively stable from prior CT of 2005.  I do not have current labs at time of visit. Appears Dr. Phillips Odor ordered routine labs to include CBC, CMP. Will need to request these.   Last HFP on file from 2011 with AST 81, ALT 60, AP 118.    Past Medical History  Diagnosis Date  . COPD (chronic obstructive pulmonary disease)   . ETOH abuse   . Cirrhosis     pt unsure  . Hyperlipidemia   . Depression with anxiety     Past Surgical History  Procedure Date  . Knee surgery     left  . Inguinal hernia repair   . Skull fracture elevation   . Broken arm   . External ear surgery   . Wisdom tooth extraction   . Bilateral cataract surgery     Current Outpatient Prescriptions  Medication Sig Dispense Refill  . ALPRAZolam (XANAX) 0.5 MG tablet Take 0.5 mg by mouth at bedtime as needed. Pt takes 1/2 tablet most nights      . buPROPion (WELLBUTRIN XL) 150 MG 24 hr tablet Take 150 mg by mouth 1 day or 1 dose.      . ibuprofen (ADVIL,MOTRIN) 200 MG tablet Take 200 mg by mouth 1 day or 1 dose.      . tiotropium (SPIRIVA) 18 MCG inhalation capsule Place 18 mcg into inhaler and inhale daily.       . peg 3350 powder (MOVIPREP) SOLR Take 1 kit (100 g total) by mouth once. As directed Please purchase 1 Fleets enema to use with the prep  1 kit  0    Allergies as of 06/12/2011  . (No Known Allergies)    Family History  Problem Relation Age of Onset  . Colon cancer Neg Hx     History   Social History  . Marital Status: Married    Spouse Name: N/A    Number of Children: N/A  . Years of Education: N/A   Occupational History  . retired    Social History Main Topics  . Smoking status: Current Everyday Smoker -- 0.5 packs/day    Types: Cigarettes  . Smokeless tobacco: Not on file   Comment: 4-10 a day  . Alcohol Use: Yes      beer only. 6-8 bottles per day.   . Drug Use: No  . Sexually Active: Not on file   Other Topics Concern  . Not on file   Social History Narrative  . No narrative on file    Review of Systems: Gen: Denies any fever, chills, fatigue, weight loss, lack of appetite.  CV: Denies chest  pain, heart palpitations, peripheral edema, syncope.  Resp: Denies shortness of breath at rest or with exertion. Denies wheezing or cough.  GI: Denies dysphagia or odynophagia. Denies jaundice, hematemesis, fecal incontinence. GU : Denies urinary burning, urinary frequency, urinary hesitancy MS: Denies joint pain, muscle weakness, cramps, or limitation of movement.  Derm: Denies rash, itching, dry skin Psych: Denies depression, anxiety, memory loss, and confusion Heme: Denies bruising, bleeding, and enlarged lymph nodes.  Physical Exam: BP 164/77  Pulse 81  Temp(Src) 98.3 F (36.8 C) (Temporal)  Ht 5\' 11"  (1.803 m)  Wt 162 lb (73.483 kg)  BMI 22.59 kg/m2 General:   Alert and oriented. Pleasant and cooperative. Well-nourished and well-developed.  Head:  Normocephalic and atraumatic. Eyes:  Without icterus, sclera clear and conjunctiva pink.  Ears:  Normal auditory acuity. Nose:  No deformity, discharge,  or lesions. Mouth:  No deformity or lesions, oral  mucosa pink.  Neck:  Supple, without mass or thyromegaly. Lungs:  Clear to auscultation bilaterally. No wheezes, rales, or rhonchi. No distress.  Heart:  S1, S2 present without murmurs appreciated.  Abdomen:  +BS, soft, non-tender and non-distended. Liver border easily palpable. No guarding or rebound. No masses appreciated.  Rectal:  Deferred  Msk:  Symmetrical without gross deformities. Normal posture. Extremities:  Without clubbing or edema. Neurologic:  Alert and  oriented x4;  grossly normal neurologically. Skin:  Intact without significant lesions or rashes. Cervical Nodes:  No significant cervical adenopathy. Psych:  Alert and cooperative. Normal mood and affect.

## 2011-06-28 NOTE — Op Note (Signed)
Ian Aguilar, Ian Aguilar               ACCOUNT NO.:  [id number]  MEDICAL RECORD NO.:  [id number]  LOCATION:  [facility]                          FACILITY:  [facility]  PHYSICIAN:  R. Roetta Sessions, MD FACP FACGDATE OF BIRTH:  Jan 03, 1941  DATE OF PROCEDURE:  06/28/2011 DATE OF DISCHARGE:                              OPERATIVE REPORT   PROCEDURE:  Colonoscopy with biopsy.  INDICATIONS FOR PROCEDURES:  This 71 year old gentleman comes for screening colonoscopy.  Last screening colonoscopy was 1996.  No family history of colon cancer, colon polyps.  Colonoscopy is now being done. Risks, benefits, limitations, alternatives, imponderables have been discussed, questions answered.  Please see the documentation in medical record.  PROCEDURE NOTE:  O2 saturation, blood pressure, pulses, and respirations monitored throughout the entire procedure.  CONSCIOUS SEDATION:  Versed 3 mg IV, Demerol 75 mg IV in divided doses. Phenergan 12.5 mg IV to augment conscious sedation.  INSTRUMENT:  Pentax video chip system.  FINDINGS:  Digital rectal exam revealed no abnormalities aside from external hemorrhoids.  Prep was adequate.  Examination of the colonic mucosa was undertaken from the rectosigmoid junction.  Scope was easily advanced through the left transverse right colon to the appendiceal orifice, ileocecal valve/cecum.  These structures were well seen, photographed for the record.  From this level, scope was slowly and cautiously withdrawn.  All previously mucosal surfaces were again seen. The patient had sigmoid diverticula.  The patient had single diminutive polyp in the base of the cecum.  The remainder of colonic mucosa appeared normal.  The scope was slowly and cautiously withdrawn.  All previous mucosal surfaces were again seen.  At the rectosigmoid junction, there was a second diminutive polyp.  The remainder of the more distal rectum was normal.  A retroflexion exam was performed.  On the way out,  the cecal and rectosigmoid polyps were cold biopsied/removed.  The patient tolerated the procedure well.  Cecal withdrawal time, 9 minutes.  IMPRESSION: 1. Rectosigmoid cecal polyp - removed as described above. 2. Sigmoid diverticulosis.  RECOMMENDATIONS:  Follow up on pathology.  See EGD report.     Jonathon Bellows, MD Caleen Essex     RMR/MEDQ  D:  06/28/2011  T:  06/28/2011  Job:  [id number]  cc:   Corrie Mckusick, M.D. Fax: [phone]

## 2011-06-28 NOTE — Discharge Instructions (Addendum)
EGD Discharge instructions Please read the instructions outlined below and refer to this sheet in the next few weeks. These discharge instructions provide you with general information on caring for yourself after you leave the hospital. Your doctor may also give you specific instructions. While your treatment has been planned according to the most current medical practices available, unavoidable complications occasionally occur. If you have any problems or questions after discharge, please call your doctor. ACTIVITY  You may resume your regular activity but move at a slower pace for the next 24 hours.   Take frequent rest periods for the next 24 hours.   Walking will help expel (get rid of) the air and reduce the bloated feeling in your abdomen.   No driving for 24 hours (because of the anesthesia (medicine) used during the test).   You may shower.   Do not sign any important legal documents or operate any machinery for 24 hours (because of the anesthesia used during the test).  NUTRITION  Drink plenty of fluids.   You may resume your normal diet.   Begin with a light meal and progress to your normal diet.   Avoid alcoholic beverages for 24 hours or as instructed by your caregiver.  MEDICATIONS  You may resume your normal medications unless your caregiver tells you otherwise.  WHAT YOU CAN EXPECT TODAY  You may experience abdominal discomfort such as a feeling of fullness or "gas" pains.  FOLLOW-UP  Your doctor will discuss the results of your test with you.  SEEK IMMEDIATE MEDICAL ATTENTION IF ANY OF THE FOLLOWING OCCUR:  Excessive nausea (feeling sick to your stomach) and/or vomiting.   Severe abdominal pain and distention (swelling).   Trouble swallowing.   Temperature over 101 F (37.8 C).   Rectal bleeding or vomiting of blood.   Colonoscopy Discharge Instructions  Read the instructions outlined below and refer to this sheet in the next few weeks. These  discharge instructions provide you with general information on caring for yourself after you leave the hospital. Your doctor may also give you specific instructions. While your treatment has been planned according to the most current medical practices available, unavoidable complications occasionally occur. If you have any problems or questions after discharge, call Dr. Gala Romney at [phone]. ACTIVITY  You may resume your regular activity, but move at a slower pace for the next 24 hours.   Take frequent rest periods for the next 24 hours.   Walking will help get rid of the air and reduce the bloated feeling in your belly (abdomen).   No driving for 24 hours (because of the medicine (anesthesia) used during the test).    Do not sign any important legal documents or operate any machinery for 24 hours (because of the anesthesia used during the test).  NUTRITION  Drink plenty of fluids.   You may resume your normal diet as instructed by your doctor.   Begin with a light meal and progress to your normal diet. Heavy or fried foods are harder to digest and may make you feel sick to your stomach (nauseated).   Avoid alcoholic beverages for 24 hours or as instructed.  MEDICATIONS  You may resume your normal medications unless your doctor tells you otherwise.  WHAT YOU CAN EXPECT TODAY  Some feelings of bloating in the abdomen.   Passage of more gas than usual.   Spotting of blood in your stool or on the toilet paper.  IF YOU HAD POLYPS REMOVED DURING THE COLONOSCOPY:  No aspirin products for 7 days or as instructed.   No alcohol for 7 days or as instructed.   Eat a soft diet for the next 24 hours.  FINDING OUT THE RESULTS OF YOUR TEST Not all test results are available during your visit. If your test results are not back during the visit, make an appointment with your caregiver to find out the results. Do not assume everything is normal if you have not heard from your caregiver or the  medical facility. It is important for you to follow up on all of your test results.  SEEK IMMEDIATE MEDICAL ATTENTION IF:  You have more than a spotting of blood in your stool.   Your belly is swollen (abdominal distention).   You are nauseated or vomiting.   You have a temperature over 101.   You have abdominal pain or discomfort that is severe or gets worse throughout the day.    Polyp and diverticulosis information provided.  GERD information provided.  Begin Protonix 40 mg orally daily for acid reflux.  Schedule MRI of the pancreas to further evaluate abnormal pancreas findings on recent CT scan  Chrystal at Dr Jeanella Flattery office will call with date and time of MRI         Gastroesophageal Reflux Disease, Adult Gastroesophageal reflux disease (GERD) happens when acid from your stomach flows up into the esophagus. When acid comes in contact with the esophagus, the acid causes soreness (inflammation) in the esophagus. Over time, GERD may create small holes (ulcers) in the lining of the esophagus. CAUSES   Increased body weight. This puts pressure on the stomach, making acid rise from the stomach into the esophagus.   Smoking. This increases acid production in the stomach.   Drinking alcohol. This causes decreased pressure in the lower esophageal sphincter (valve or ring of muscle between the esophagus and stomach), allowing acid from the stomach into the esophagus.   Late evening meals and a full stomach. This increases pressure and acid production in the stomach.   A malformed lower esophageal sphincter.  Sometimes, no cause is found. SYMPTOMS   Burning pain in the lower part of the mid-chest behind the breastbone and in the mid-stomach area. This may occur twice a week or more often.   Trouble swallowing.   Sore throat.   Dry cough.   Asthma-like symptoms including chest tightness, shortness of breath, or wheezing.  DIAGNOSIS  Your caregiver may be able to  diagnose GERD based on your symptoms. In some cases, X-rays and other tests may be done to check for complications or to check the condition of your stomach and esophagus. TREATMENT  Your caregiver may recommend over-the-counter or prescription medicines to help decrease acid production. Ask your caregiver before starting or adding any new medicines.  HOME CARE INSTRUCTIONS   Change the factors that you can control. Ask your caregiver for guidance concerning weight loss, quitting smoking, and alcohol consumption.   Avoid foods and drinks that make your symptoms worse, such as:   Caffeine or alcoholic drinks.   Chocolate.   Peppermint or mint flavorings.   Garlic and onions.   Spicy foods.   Citrus fruits, such as oranges, lemons, or limes.   Tomato-based foods such as sauce, chili, salsa, and pizza.   Fried and fatty foods.   Avoid lying down for the 3 hours prior to your bedtime or prior to taking a nap.   Eat small, frequent meals instead of large meals.  Wear loose-fitting clothing. Do not wear anything tight around your waist that causes pressure on your stomach.   Raise the head of your bed 6 to 8 inches with wood blocks to help you sleep. Extra pillows will not help.   Only take over-the-counter or prescription medicines for pain, discomfort, or fever as directed by your caregiver.   Do not take aspirin, ibuprofen, or other nonsteroidal anti-inflammatory drugs (NSAIDs).  SEEK IMMEDIATE MEDICAL CARE IF:   You have pain in your arms, neck, jaw, teeth, or back.   Your pain increases or changes in intensity or duration.   You develop nausea, vomiting, or sweating (diaphoresis).   You develop shortness of breath, or you faint.   Your vomit is green, yellow, black, or looks like coffee grounds or blood.   Your stool is red, bloody, or black.  These symptoms could be signs of other problems, such as heart disease, gastric bleeding, or esophageal bleeding. MAKE SURE  YOU:   Understand these instructions.   Will watch your condition.   Will get help right away if you are not doing well or get worse.  Document Released: 11/23/2004 Document Revised: 02/02/2011 Document Reviewed: 09/02/2010 [facility] Patient Information 2012 [place], [place].Colon Polyps A polyp is extra tissue that grows inside your body. Colon polyps grow in the large intestine. The large intestine, also called the colon, is part of your digestive system. It is a long, hollow tube at the end of your digestive tract where your body makes and stores stool. Most polyps are not dangerous. They are benign. This means they are not cancerous. But over time, some types of polyps can turn into cancer. Polyps that are smaller than a pea are usually not harmful. But larger polyps could someday become or may already be cancerous. To be safe, doctors remove all polyps and test them.  WHO GETS POLYPS? Anyone can get polyps, but certain people are more likely than others. You may have a greater chance of getting polyps if:  You are over 50.   You have had polyps before.   Someone in your family has had polyps.   Someone in your family has had cancer of the large intestine.   Find out if someone in your family has had polyps. You may also be more likely to get polyps if you:   Eat a lot of fatty foods.   Smoke.   Drink alcohol.   Do not exercise.   Eat too much.  SYMPTOMS  Most small polyps do not cause symptoms. People often do not know they have one until their caregiver finds it during a regular checkup or while testing them for something else. Some people do have symptoms like these:  Bleeding from the anus. You might notice blood on your underwear or on toilet paper after you have had a bowel movement.   Constipation or diarrhea that lasts more than a week.   Blood in the stool. Blood can make stool look black or it can show up as red streaks in the stool.  If you have any of these  symptoms, see your caregiver. HOW DOES THE DOCTOR TEST FOR POLYPS? The doctor can use four tests to check for polyps:  Digital rectal exam. The caregiver wears gloves and checks your rectum (the last part of the large intestine) to see if it feels normal. This test would find polyps only in the rectum. Your caregiver may need to do one of the other tests listed below  to find polyps higher up in the intestine.   Barium enema. The caregiver puts a liquid called barium into your rectum before taking x-rays of your large intestine. Barium makes your intestine look white in the pictures. Polyps are dark, so they are easy to see.   Sigmoidoscopy. With this test, the caregiver can see inside your large intestine. A thin flexible tube is placed into your rectum. The device is called a sigmoidoscope, which has a light and a tiny video camera in it. The caregiver uses the sigmoidoscope to look at the last third of your large intestine.   Colonoscopy. This test is like sigmoidoscopy, but the caregiver looks at all of the large intestine. It usually requires sedation. This is the most common method for finding and removing polyps.  TREATMENT   The caregiver will remove the polyp during sigmoidoscopy or colonoscopy. The polyp is then tested for cancer.   If you have had polyps, your caregiver may want you to get tested regularly in the future.  PREVENTION  There is not one sure way to prevent polyps. You might be able to lower your risk of getting them if you:  Eat more fruits and vegetables and less fatty food.   Do not smoke.   Avoid alcohol.   Exercise every day.   Lose weight if you are overweight.   Eating more calcium and folate can also lower your risk of getting polyps. Some foods that are rich in calcium are milk, cheese, and broccoli. Some foods that are rich in folate are chickpeas, kidney beans, and spinach.   Aspirin might help prevent polyps. Studies are under way.  Document  Released: 11/10/2003 Document Revised: 02/02/2011 Document Reviewed: 04/17/2007 [facility] Patient Information 2012 [place], [place].

## 2011-06-28 NOTE — Op Note (Signed)
NAMEANTHONYJAMES, BARGAR               ACCOUNT NO.:  [id number]  MEDICAL RECORD NO.:  [id number]  LOCATION:  [facility]                          FACILITY:  [facility]  PHYSICIAN:  R. Roetta Sessions, MD FACP FACGDATE OF BIRTH:  23-Sep-1940  DATE OF PROCEDURE: DATE OF DISCHARGE:                              OPERATIVE REPORT   PROCEDURE:  EGD with gastric biopsy.  INDICATIONS FOR PROCEDURE:  A 71 year old gentleman found to have an abnormal-appearing liver consistent with cirrhosis on the recent CT scan, also 2 hypodense lesions with head of pancreas were not present on prior imaging.  He is here today to undergo a screening colonoscopy (just performed, see separate report).  While he is here, we have discussed the approach of performing EGD screen for varices.  Risks, benefits, limitations, alternatives, and imponderables have been reviewed previously and again stated and he is agreeable.  Please see the documentation medical record.  PROCEDURE NOTE:  O2 saturation, blood pressure, pulse, respirations monitored throughout the entirety of the procedure.  CONSCIOUS SEDATION:  Versed 3 mg IV, Demerol 75 mg IV in divided doses, Phenergan 12.5 mg IV prior to the procedure to augment conscious sedation.  Cetacaine spray for topical pharyngeal anesthesia.  FINDINGS:  Examination of the tubular esophagus revealed mucosal breaks with erosions and 3 mm of the GE junction consistent with erosive reflux esophagitis.  There were no esophageal varices.  No Barrett esophagus. Tubular esophagus is widely patent.  EG junction is easily traversed.  Stomach:  Gastric cavity was emptied, insufflated well with air. Thorough examination of the gastric mucosa including retroflexion of proximal stomach, esophagogastric junction demonstrated a small hiatal hernia and scattered gastric erosions.  No evidence for gastropathy.  No ulcer or infiltrating process seen.  The pylorus was patent and easily traversed.  Examination  of bulb and second portion revealed no abnormalities.  Therapeutic/diagnostic maneuvers performed:  Biopsies of the gastric antrum and body were taken for histologic study.  The patient tolerated the procedure well, was reactive to endoscopy.  IMPRESSION: 1. 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 2. Hiatal hernia. 3. Gastric erosions of uncertain significance -- status post biopsy.  RECOMMENDATIONS: 1. Begin Protonix 40 mg orally daily. 2. GERD information provided. 3. See colonoscopy report. 4. Proceed with an MRI of the pancreas to further evaluate     abnormalities suggestive on recent CT scanning. 5. Further recommendations to follow.     Jonathon Bellows, MD FACP [facility]     RMR/MEDQ  D:  06/28/2011  T:  06/28/2011  Job:  [id number]  cc:   Dr. Assunta Found

## 2011-06-28 NOTE — Interval H&P Note (Signed)
History and Physical Interval Note:  06/28/2011 1:24 PM  [name]  has presented today for surgery, with the diagnosis of SCREENING TCS, cirrhosis  The various methods of treatment have been discussed with the patient and family. After consideration of risks, benefits and other options for treatment, the patient has consented to  Procedure(s) (LRB): COLONOSCOPY (N/A) ESOPHAGOGASTRODUODENOSCOPY (EGD) (N/A) as a surgical intervention .  The patients' history has been reviewed, patient examined, no change in status, stable for surgery.  I have reviewed the patients' chart and labs.  Questions were answered to the patient's satisfaction.     [name]

## 2011-07-03 ENCOUNTER — Encounter: Payer: Self-pay | Admitting: Internal Medicine

## 2011-07-03 ENCOUNTER — Encounter (HOSPITAL_COMMUNITY): Payer: Self-pay | Admitting: Internal Medicine

## 2011-07-04 ENCOUNTER — Encounter: Payer: Self-pay | Admitting: Gastroenterology

## 2011-07-04 ENCOUNTER — Other Ambulatory Visit: Payer: Self-pay | Admitting: Gastroenterology

## 2011-07-04 ENCOUNTER — Telehealth: Payer: Self-pay | Admitting: Gastroenterology

## 2011-07-04 DIAGNOSIS — R932 Abnormal findings on diagnostic imaging of liver and biliary tract: Secondary | ICD-10-CM

## 2011-07-04 NOTE — Telephone Encounter (Signed)
Pt is scheduled for MRI abdomen on 05/13- left message for pt to call me back- also mailed letter

## 2011-07-05 NOTE — Telephone Encounter (Signed)
Pt called back--he is aware 

## 2011-07-10 ENCOUNTER — Other Ambulatory Visit: Payer: Self-pay | Admitting: Internal Medicine

## 2011-07-10 ENCOUNTER — Ambulatory Visit (HOSPITAL_COMMUNITY)
Admission: RE | Admit: 2011-07-10 | Discharge: 2011-07-10 | Disposition: A | Discharge status: Home / Self Care | Payer: Medicare Other | Source: Ambulatory Visit | Attending: Internal Medicine | Admitting: Internal Medicine

## 2011-07-10 ENCOUNTER — Encounter: Payer: Self-pay | Admitting: Gastroenterology

## 2011-07-10 DIAGNOSIS — R932 Abnormal findings on diagnostic imaging of liver and biliary tract: Secondary | ICD-10-CM

## 2011-07-10 DIAGNOSIS — Z01818 Encounter for other preprocedural examination: Secondary | ICD-10-CM

## 2011-07-10 DIAGNOSIS — K869 Disease of pancreas, unspecified: Secondary | ICD-10-CM | POA: Insufficient documentation

## 2011-07-10 MED ORDER — GADOBENATE DIMEGLUMINE 529 MG/ML IV SOLN
15.0000 mL | Freq: Once | INTRAVENOUS | Status: AC | PRN
  Administered 2011-07-10: 15 mL via INTRAVENOUS

## 2011-07-10 NOTE — Progress Notes (Unsigned)
Pt needs f/u in our office for cirrhosis (found via CT, pt was unsure about hx but now this is documented that he does have cirrhosis). At visit: labs such as AFP, discuss vaccinations, etc.

## 2011-07-18 ENCOUNTER — Telehealth: Payer: Self-pay | Admitting: Gastroenterology

## 2011-07-18 NOTE — Telephone Encounter (Signed)
Pt aware of results 

## 2011-07-18 NOTE — Progress Notes (Signed)
Quick Note:  Tried to call pt- LM with wife to return call. ______ 

## 2011-07-18 NOTE — Progress Notes (Signed)
Pt is aware of OV on 7/12 @ 10 w/RMR

## 2011-07-18 NOTE — Telephone Encounter (Signed)
Pt returned call- please call back at [phone]

## 2011-07-20 NOTE — Progress Notes (Signed)
Pt is aware of OV on 7/12 at 10 with RMR

## 2011-08-14 ENCOUNTER — Other Ambulatory Visit (HOSPITAL_COMMUNITY): Payer: Self-pay | Admitting: Family Medicine

## 2011-08-14 ENCOUNTER — Ambulatory Visit (HOSPITAL_COMMUNITY)
Admission: RE | Admit: 2011-08-14 | Discharge: 2011-08-14 | Disposition: A | Discharge status: Home / Self Care | Payer: Medicare Other | Source: Ambulatory Visit | Attending: Family Medicine | Admitting: Family Medicine

## 2011-08-14 DIAGNOSIS — M25559 Pain in unspecified hip: Secondary | ICD-10-CM

## 2011-08-30 ENCOUNTER — Ambulatory Visit: Payer: Medicare Other | Admitting: Orthopedic Surgery

## 2011-08-30 ENCOUNTER — Ambulatory Visit (INDEPENDENT_AMBULATORY_CARE_PROVIDER_SITE_OTHER): Payer: Medicare Other | Admitting: Orthopedic Surgery

## 2011-08-30 ENCOUNTER — Encounter: Payer: Self-pay | Admitting: Orthopedic Surgery

## 2011-08-30 VITALS — BP 136/70 | Ht 71.0 in | Wt 162.0 lb

## 2011-08-30 DIAGNOSIS — M169 Osteoarthritis of hip, unspecified: Secondary | ICD-10-CM

## 2011-08-30 MED ORDER — HYDROCODONE-ACETAMINOPHEN 5-325 MG PO TABS: 1.0000 | ORAL_TABLET | Freq: Four times a day (QID) | ORAL | Status: AC | PRN

## 2011-08-30 NOTE — Patient Instructions (Addendum)
Neurology consult Balance problem   Total Hip Replacement In total hip replacement, the damaged hip is replaced with an artificial hip joint (prosthesis). The purpose of this surgery is to reduce pain and improve your range of motion. It is one of the most successful joint replacement surgeries. LET YOUR CAREGIVER KNOW ABOUT:    Allergies.   Medicines taken, including herbs, eyedrops, over-the-counter medicines, and creams.   Use of steroids (by mouth or creams).   Previous problems with anesthetics.   Family history of anesthetic problems.   Possibility of pregnancy, if this applies.   History of blood clots (thrombophlebitis).   History of bleeding or blood problems.   Previous surgery.   Other health problems.  BEFORE THE PROCEDURE    Do not eat or drink anything for as long as directed by your caregiver before surgery.   You should be present 60 minutes before your procedure or as directed by your caregiver.  PROCEDURE An intravenous (IV) line for giving fluids will be started. You will be given medicines and gas to make you sleep (anesthetic), or you will be given medicines through a needle in your back to make you numb from the waist down. Your surgeon will take out any damaged cartilage and bone. He or she will then put in new metal, plastic, or ceramic joint surfaces to restore alignment and function to your hip. AFTER THE PROCEDURE   After the procedure, you will be taken to the recovery area where a nurse will watch and check your progress. You may have a long, narrow tube (catheter) in your bladder after surgery. The catheter helps you empty your bladder (pass your urine). Once you are awake, stable, and taking fluids well, you will be returned to your room. You will receive physical therapy until you are doing well and your caregiver feels it is safe for you to go home. If you do not have help at home, you may need to go to an extended care facility for 5 to 14 days after  the procedure. Document Released: 05/22/2000 Document Revised: 02/02/2011 Document Reviewed: 12/16/2008 [facility] Patient Information 2012 [place], [place].

## 2011-09-01 ENCOUNTER — Encounter: Payer: Self-pay | Admitting: Orthopedic Surgery

## 2011-09-01 DIAGNOSIS — M169 Osteoarthritis of hip, unspecified: Secondary | ICD-10-CM | POA: Insufficient documentation

## 2011-09-01 NOTE — Progress Notes (Signed)
  Subjective:    Ian Aguilar is a 71 y.o. male who presents with left hip pain. Onset of the symptoms was several years ago. Inciting event: none. The patient reports the hip pain is worse with weight bearing, is aggravated by walking and is worse with trying to bend over, getting up from a seated position . Aggravating symptoms include: any weight bearing, going up and down stairs, pivoting, rising after sitting, standing and walking. Patient has had no prior hip problems. Previous visits for this problem: none. Evaluation to date: plain films, which were abnormal  suggesting OA poss AVN , will need new films for templating . Treatment to date: none.  The following portions of the patient's history were reviewed and updated as appropriate: allergies, current medications, past family history, past medical history, past social history, past surgical history and problem list.   Review of Systems Pertinent items are noted in HPI. and in patients self reported ROS    Objective:    BP 136/70  Ht 5\' 11"  (1.803 m)  Wt 162 lb (73.483 kg)  BMI 22.59 kg/m2 Vital signs are stable as recorded  General appearance is normal, very thin   The patient is alert and oriented x3  The patient's mood and affect are normal  Gait assessment: slightly altered The cardiovascular exam reveals normal pulses and temperature without edema swelling.  The lymphatic system is negative for palpable lymph nodes  The sensory exam is normal.  There are no pathologic reflexes.  Balance is normal.??  Exam of the left hip  Inspection leg lengths are equal  Range of motion abnormal flexion and IR  Stability normal  Strength normal muscle tone  Skin evidence of easy bruising   Right hip: normal ROM, Stability and muscle tone, no deformity   Left hip: positives: decreased abduction, decreased flexion, decreased ROM, pain with flexion, pain with movement of hip and pain with rotation and negatives: no pain with  heel impact pulses full   Imaging: X-ray the left hip: shows DJD changes, likely chronic and ? 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 Questions answered. Transport planner distributed. Neurology work up to assess reasons for falling to see if things can be corrected because the falling which may be related to the alcoholism will put him at risk for periprosthetic fracture, he is also at risk for excessive bleeding (seeing Dr Kendell Bane for Cirrhosis work up)     This has been fully explained to the patient, who indicates understanding.

## 2011-09-08 ENCOUNTER — Ambulatory Visit (INDEPENDENT_AMBULATORY_CARE_PROVIDER_SITE_OTHER): Payer: Medicare Other | Admitting: Internal Medicine

## 2011-09-08 ENCOUNTER — Encounter: Payer: Self-pay | Admitting: Internal Medicine

## 2011-09-08 VITALS — BP 145/80 | HR 80 | Temp 98.8°F | Ht 71.0 in | Wt 161.2 lb

## 2011-09-08 DIAGNOSIS — K7460 Unspecified cirrhosis of liver: Secondary | ICD-10-CM

## 2011-09-08 DIAGNOSIS — K429 Umbilical hernia without obstruction or gangrene: Secondary | ICD-10-CM

## 2011-09-08 LAB — FERRITIN: Ferritin: 38 ng/mL (ref 22–322)

## 2011-09-08 LAB — IRON AND TIBC
%SAT: 26 % (ref 20–55)
Iron: 111 ug/dL (ref 42–165)
TIBC: 426 ug/dL (ref 215–435)
UIBC: 315 ug/dL (ref 125–400)

## 2011-09-08 NOTE — Progress Notes (Signed)
Primary Care Physician:  Colette Ribas, MD Primary Gastroenterologist:  Dr. Jena Gauss  Pre-Procedure History & Physical: HPI:  Ian Aguilar is a 71 y.o. male here for followup. Long-standing alcohol abuse. Cirrhosis on recent abdominal MRI. MRI demonstrated 2 small likely cysts in the pancreas. 1 year followup recommended. No varices on recent EGD. Patient has cut down to 3 beers daily but continues to drink alcohol. He is seeing Dr. Romeo Apple because of a bad hip. Apparently, hip replacement is slated in the near future.  Recent colonoscopy demonstrated tubular adenoma. He'll be due for surveillance colonoscopy in 5 years.  Past Medical History  Diagnosis Date  . COPD (chronic obstructive pulmonary disease)   . ETOH abuse   . Cirrhosis     confirmed by MRI on 07/04/11.  no immunizations  . Hyperlipidemia   . Depression with anxiety     Past Surgical History  Procedure Date  . Knee surgery     left  . Inguinal hernia repair   . Skull fracture elevation   . Broken arm   . External ear surgery   . Wisdom tooth extraction   . Bilateral cataract surgery   . Colonoscopy 1996    Rehman: external hemorrhoids, no polyps  . Colonoscopy 06/28/2011    Procedure: COLONOSCOPY;  Surgeon: Corbin Ade, MD;  Location: [facility];  Service: Endoscopy;  Laterality: N/A;  12:30PM  . Esophagogastroduodenoscopy 06/28/2011    Procedure: ESOPHAGOGASTRODUODENOSCOPY (EGD);  Surgeon: Corbin Ade, MD;  Location: [facility];  Service: Endoscopy;  Laterality: N/A;    Prior to Admission medications   Medication Sig Start Date End Date Taking? Authorizing Provider  ALPRAZolam Prudy Feeler) 0.5 MG tablet Take 0.25-0.5 mg by mouth every 6 (six) hours as needed. For anxiety  Pt takes 1/2 tablet most nights   Yes Historical Provider, MD  buPROPion (WELLBUTRIN XL) 150 MG 24 hr tablet Take 150 mg by mouth every morning.    Yes Historical Provider, MD  HYDROcodone-acetaminophen (NORCO) 5-325 MG per tablet Take 1  tablet by mouth every 6 (six) hours as needed for pain. 08/30/11 09/09/11 Yes Vickki Hearing, MD  meloxicam (MOBIC) 15 MG tablet Take 15 mg by mouth daily.   Yes Historical Provider, MD  tiotropium (SPIRIVA) 18 MCG inhalation capsule Place 18 mcg into inhaler and inhale daily.   Yes Historical Provider, MD    Allergies as of 09/08/2011  . (No Known Allergies)    Family History  Problem Relation Age of Onset  . Colon cancer Neg Hx     History   Social History  . Marital Status: Married    Spouse Name: N/A    Number of Children: N/A  . Years of Education: N/A   Occupational History  . retired    Social History Main Topics  . Smoking status: Current Everyday Smoker -- 0.5 packs/day    Types: Cigarettes  . Smokeless tobacco: Not on file   Comment: 4-10 a day  . Alcohol Use: Yes      beer only. 6-8 bottles per day.   . Drug Use: No  . Sexually Active: Not on file   Other Topics Concern  . Not on file   Social History Narrative  . No narrative on file    Review of Systems: See HPI, otherwise negative ROS  Physical Exam: BP 145/80  Pulse 80  Temp 98.8 F (37.1 C) (Temporal)  Ht 5\' 11"  (1.803 m)  Wt 161 lb 3.2 oz (  73.12 kg)  BMI 22.48 kg/m2 General:  Thin somewhat chronically ill pleasant and cooperative in NAD Skin:  Intact without significant lesions or rashes. Eyes:  Sclera clear, no icterus.   Conjunctiva pink.  Facial telangiectasias Ears:  Normal auditory acuity. Nose:  No deformity, discharge,  or lesions. Mouth:  No deformity or lesions. Neck:  Supple; no masses or thyromegaly. No significant cervical adenopathy. Lungs:  Clear throughout to auscultation.   No wheezes, crackles, or rhonchi. No acute distress. Heart:  Regular rate and rhythm; no murmurs, clicks, rubs,  or gallops. Abdomen: Non-distended, normal bowel sounds.  Soft and nontender; left lobe of the liver easily palpable 5 fingerbreadths below the right costal margin. Small easily reducible  umbilical hernia. Do not appreciate a spleen. No obvious ascites. Pulses:  Normal pulses noted. Extremities:  Without clubbing or edema.  Impression/Plan:   Pleasant 71 year old gentleman with cirrhosis likely related to long-standing alcohol abuse. He has fairly well-preserved hepatic synthetic function. No varices on recent EGD. Small pancreatic cysts whichwarrant MRI follow up in one year. I recommended third-party help for complete alcohol cessation. He needs a limited workup for other potential causes of liver disease however, most likely his cirrhosis is secondary to alcohol.  Recommendations: Hepatitis B surface antigen and hepatitis C antibody. Iron studies. He'll likely need vaccination against hepatitis A and B. in the near future. Every six-month AFP's and ultrasounds. Consider repeat EGD to screen for varices in 2-3 years. MRI in one year. Surveillance colonoscopy in 5 years. Office visit here in 6 months

## 2011-09-08 NOTE — Patient Instructions (Signed)
afp and US liver every 6 mos  F/u mri pancreas 1 year from last to check on cysts  Hep b Surface antigen and heb c ab  If hepb ag negative , will need twin RX  To vaccinate against A and B  Serum iron , ibc and ferritin  Recommend 3rd party help for complet alcohol cessation  Office visit here in 6 months

## 2011-09-09 LAB — HEPATITIS B SURFACE ANTIGEN: Hepatitis B Surface Ag: NEGATIVE

## 2011-09-09 LAB — AFP TUMOR MARKER: AFP-Tumor Marker: 3.5 ng/mL (ref 0.0–8.0)

## 2011-09-09 LAB — HEPATITIS C ANTIBODY: HCV Ab: NEGATIVE

## 2011-09-10 ENCOUNTER — Encounter: Payer: Self-pay | Admitting: Internal Medicine

## 2011-09-11 ENCOUNTER — Other Ambulatory Visit: Payer: Self-pay | Admitting: Internal Medicine

## 2011-09-11 DIAGNOSIS — K7460 Unspecified cirrhosis of liver: Secondary | ICD-10-CM

## 2011-09-13 NOTE — Progress Notes (Signed)
Reminder in epic to follow up in 6 months with extender °

## 2011-10-02 ENCOUNTER — Other Ambulatory Visit: Payer: Self-pay | Admitting: Neurology

## 2011-10-02 DIAGNOSIS — R2681 Unsteadiness on feet: Secondary | ICD-10-CM

## 2011-10-13 ENCOUNTER — Ambulatory Visit (HOSPITAL_COMMUNITY)
Admission: RE | Admit: 2011-10-13 | Discharge: 2011-10-13 | Disposition: A | Discharge status: Home / Self Care | Payer: Medicare Other | Source: Ambulatory Visit | Attending: Neurology | Admitting: Neurology

## 2011-10-13 DIAGNOSIS — R269 Unspecified abnormalities of gait and mobility: Secondary | ICD-10-CM | POA: Insufficient documentation

## 2011-10-13 DIAGNOSIS — R42 Dizziness and giddiness: Secondary | ICD-10-CM | POA: Insufficient documentation

## 2011-10-13 DIAGNOSIS — R2681 Unsteadiness on feet: Secondary | ICD-10-CM

## 2011-12-04 ENCOUNTER — Other Ambulatory Visit: Payer: Self-pay | Admitting: Orthopedic Surgery

## 2011-12-04 DIAGNOSIS — M25559 Pain in unspecified hip: Secondary | ICD-10-CM

## 2011-12-04 MED ORDER — HYDROCODONE-ACETAMINOPHEN 5-325 MG PO TABS: 1.0000 | ORAL_TABLET | ORAL | Status: DC | PRN

## 2011-12-06 ENCOUNTER — Ambulatory Visit (INDEPENDENT_AMBULATORY_CARE_PROVIDER_SITE_OTHER): Payer: Medicare Other | Admitting: Orthopedic Surgery

## 2011-12-06 ENCOUNTER — Encounter (HOSPITAL_COMMUNITY): Payer: Self-pay

## 2011-12-06 ENCOUNTER — Encounter: Payer: Self-pay | Admitting: Orthopedic Surgery

## 2011-12-06 VITALS — BP 160/80 | Ht 71.0 in | Wt 161.0 lb

## 2011-12-06 DIAGNOSIS — M169 Osteoarthritis of hip, unspecified: Secondary | ICD-10-CM

## 2011-12-06 DIAGNOSIS — M25559 Pain in unspecified hip: Secondary | ICD-10-CM

## 2011-12-06 MED ORDER — HYDROCODONE-ACETAMINOPHEN 5-325 MG PO TABS: 1.0000 | ORAL_TABLET | ORAL | Status: DC | PRN

## 2011-12-06 NOTE — H&P (Signed)
TOTAL HIP ADMISSION H&P   Patient is admitted for left total hip arthroplasty.   Subjective:   Chief Complaint: left hip pain   HPI: Ian Aguilar, 71 y.o. male, has a history of pain and functional disability in the left hip(s) due to arthritis and patient has failed non-surgical conservative treatments for greater than 12 weeks to include NSAID's and/or analgesics, use of assistive devices and activity modification.  Onset of symptoms was gradual starting 5 years ago with gradually worsening course since that time.The patient noted no past surgery on the left hip(s).  Patient currently rates pain in the left hip at 8 out of 10 with activity. Patient has night pain, worsening of pain with activity and weight bearing, pain that interfers with activities of daily living, pain with passive range of motion and crepitus. Patient has evidence of subchondral cysts, subchondral sclerosis and joint space narrowing by imaging studies. This condition presents safety issues increasing the risk of falls. This patient has had no other treatment .  There is no current active infection.    Patient Active Problem List     Diagnosis  Date Noted   .  Cirrhosis  09/08/2011   .  Umbilical hernia  09/08/2011   .  OA (osteoarthritis) of hip  09/01/2011   .  Hepatomegaly  06/14/2011   .  Encounter for screening colonoscopy  06/14/2011   .  JOINT EFFUSION, KNEE  02/10/2008   .  KNEE PAIN  02/10/2008       Past Medical History   Diagnosis  Date   .  COPD (chronic obstructive pulmonary disease)     .  ETOH abuse     .  Cirrhosis         confirmed by MRI on 07/04/11.  no immunizations   .  Hyperlipidemia     .  Depression with anxiety         Past Surgical History   Procedure  Date   .  Knee surgery         left   .  Inguinal hernia repair     .  Skull fracture elevation     .  Broken arm     .  External ear surgery     .  Wisdom tooth extraction     .  Bilateral cataract surgery     .   Colonoscopy  1996       Rehman: external hemorrhoids, no polyps   .  Colonoscopy  06/28/2011       Dr. Rourk-sigmoid diverticulosis,tubular adenoma, hyperplastic polyp   .  Esophagogastroduodenoscopy  06/28/2011       Dr. Rourk-mild erosive reflux esophagitis, hiatal hernia-gastritis         (Not in a hospital admission) No Known Allergies   History   Substance Use Topics   .  Smoking status:  Current Every Day Smoker -- 0.5 packs/day       Types:  Cigarettes   .  Smokeless tobacco:  Not on file     Comment: 4-10 a day   .  Alcohol Use:  Yes          beer only. 6-8 bottles per day.        Family History   Problem  Relation  Age of Onset   .  Colon cancer  Neg Hx          Current outpatient prescriptions:ALPRAZolam (XANAX) 0.5 MG tablet, Take 0.25-0.5 mg   by mouth every 6 (six) hours as needed. For anxiety  Pt takes 1/2 tablet most nights, Disp: , Rfl: ;  buPROPion (WELLBUTRIN XL) 150 MG 24 hr tablet, Take 150 mg by mouth every morning. , Disp: , Rfl: ;  HYDROcodone-acetaminophen (NORCO) 5-325 MG per tablet, Take 1 tablet by mouth every 4 (four) hours as needed for pain., Disp: 60 tablet, Rfl: 1 meloxicam (MOBIC) 15 MG tablet, Take 15 mg by mouth daily., Disp: , Rfl: ;  tiotropium (SPIRIVA) 18 MCG inhalation capsule, Place 18 mcg into inhaler and inhale daily., Disp: , Rfl:    Review of Systems  Constitutional: Negative.   HENT: Negative.   Eyes: Negative.   Respiratory: Positive for cough.   Cardiovascular: Negative.   Gastrointestinal: Negative.   Genitourinary: Negative.   Musculoskeletal: Positive for myalgias, back pain, joint pain and falls.  Skin: Negative.   Neurological: Positive for dizziness.        Neurology evaluation has been completed prior to surgery and there is no contraindication to surgical treatment. The patient indicates that his dizziness and falls have resolved  Endo/Heme/Allergies: Bruises/bleeds easily.      Objective:   Physical Exam  Nursing  note and vitals reviewed. Constitutional: He is oriented to person, place, and time. He appears well-developed and well-nourished.         The patient has a thin ectomorphic body habitus  HENT:   Head: Normocephalic and atraumatic.  Eyes: Conjunctivae normal and EOM are normal. Right eye exhibits no discharge. Left eye exhibits no discharge. No scleral icterus.  Neck: Normal range of motion. No JVD present. No tracheal deviation present. No thyromegaly present.  Cardiovascular: Normal rate and intact distal pulses.   Respiratory: Effort normal and breath sounds normal. No stridor.  GI: Soft. Bowel sounds are normal. He exhibits no distension.  Lymphadenopathy:    He has no cervical adenopathy.  Neurological: He is alert and oriented to person, place, and time. He has normal reflexes. He displays normal reflexes. No cranial nerve deficit. He exhibits normal muscle tone. Coordination normal.  Skin: Skin is warm and dry. No rash noted. No erythema. No pallor.  Psychiatric: He has a normal mood and affect. His behavior is normal. Judgment and thought content normal.  Right Hip Exam    Tenderness  The patient is experiencing no tenderness.        Range of Motion  Extension: abnormal   Flexion: abnormal   Internal Rotation: abnormal   External Rotation: abnormal   Abduction: abnormal   Adduction: abnormal    Muscle Strength  Abduction: 5/5   Adduction: 5/5   Flexion: 5/5    Tests  FABER: negative   Other  Erythema: absent Scars: absent Sensation: normal Pulse: present     Left Hip Exam    Tenderness  The patient is experiencing no tenderness.        Range of Motion  Extension: abnormal   Flexion: abnormal   Internal Rotation: abnormal   External Rotation: abnormal   Abduction: abnormal   Adduction: abnormal    Muscle Strength  Abduction: 5/5   Adduction: 5/5   Flexion: 5/5    Tests  FABER: negative   Other  Erythema: absent Sensation:  normal Pulse: present   Comments:  The patient exhibited several tractors at finding of osteoarthritis of the hip with painful internal rotation and groin pain with hip flexion and internal rotation. The LEFT leg is slightly shorter than the RIGHT, but not   enough to warrant any frank leg lengthening.         Extremities are normal.   Vital signs in last 24 hours: @VSRANGES@   Labs:     Estimated Body mass index is 22.45 kg/(m^2) as calculated from the following:   Height as of this encounter: 5' 11"(1.803 m).   Weight as of this encounter: 161 lb(73.029 kg).     Imaging Review Plain radiographs demonstrate moderate degenerative joint disease of the left hip(s). The bone quality appears to be good for age and reported activity level.   Assessment/Plan:   End stage arthritis, left hip(s)   The patient history, physical examination, clinical judgement of the provider and imaging studies are consistent with end stage degenerative joint disease of the left hip(s) and left total hip arthroplasty is deemed medically necessary. The treatment options including medical management, injection therapy, arthroscopy and arthroplasty were discussed at length. The risks and benefits of total hip arthroplasty were presented and reviewed. The risks due to aseptic loosening, infection, stiffness, dislocation/subluxation,  thromboembolic complications and other imponderables were discussed.  The patient acknowledged the explanation, agreed to proceed with the plan and consent was signed. Patient is being admitted for inpatient treatment for surgery, pain control, PT, OT, prophylactic antibiotics, VTE prophylaxis, progressive ambulation and ADL's and discharge planning.The patient is planning to be discharged home with home health services      

## 2011-12-06 NOTE — Patient Instructions (Addendum)
You have been scheduled for left  total hip arthroplasty please stop all blood thinners one week prior to surgery  The risks and benefits of the procedure include but are not limited to wound infection, postoperative bleeding, operative the vein thrombosis or blood clot. Pulmonary embolus which can be fatal. Dislocation of the hip. Pain in the hip. Early hip failure due to excessive wear. The average time for recovery is 3-5 months.   You are also at risk for alcohol withdrawal syndrome   Plan for home with family support after hospitalization  Total Hip Replacement Total hip replacement is the replacement of your damaged hip with an artificial hip joint (prosthetic hip joint). The purpose of this surgery is to reduce pain and improve your hip function. LET YOUR CAREGIVER KNOW ABOUT:    Any allergies you have.   Any medicines you are taking, including vitamins, herbs, eyedrops, over-the-counter medicines, and creams.   Any problems you have had with the use of anesthetics.   Family history of problems with the use of anesthetics.   Any blood disorders you have, including bleeding problems or clotting problems.   Previous surgeries you have had.  RISKS AND COMPLICATIONS Generally, total hip replacement is a safe procedure. However, as with any surgical procedure, complications can occur. Complications associated with total hip replacement both during and after the procedure include:  Infection.   Dislocation (the ball of the hip-joint prosthesis comes out of contact with the socket).   Loosening of the stem connected to the ball or socket.   Fracture of the bone while inserting the prosthesis.   Formation of blood clots, which can break loose and travel to and injure your lungs (pulmonary embolus).  BEFORE THE PROCEDURE    Your caregiver will instruct you when you need to stop eating and drinking.   Ask your caregiver if you need to change or stop any regular medicines.    PROCEDURE Just before the procedure, you will receive medicine that will make you drowsy (sedative) or medicine to make you fall asleep (general anesthetic). This will be given through a tube that is inserted into one of your veins (intravenous [IV] tube). Then you will receive medicine to block pain from the waist down through your legs (spinal block). An incision is made in your hip. Your surgeon will take out any damaged cartilage and bone. Next, your surgeon will insert a prosthetic socket into your pelvic bone. This is usually secured with screws. Then, your surgeon will cut off the ball of your thigh bone (femur) and attach a prosthetic ball on a stem to your femur. The surgeon then places the ball into the socket and checks the range of motion of your new hip. AFTER THE PROCEDURE   You will be taken to the recovery area where a nurse will watch and check your progress. Once you are awake and stable, you will be taken to a hospital room. You will receive physical therapy until you are doing well and your caregiver feels it is safe for you to go home. Typically, you will stay in the hospital 1 4 days after your procedure. Document Released: 05/22/2000 Document Revised: 08/15/2011 Document Reviewed: 04/02/2011 [facility] Patient Information 2013 [place], [place].   Total Hip Replacement Care After Refer to this sheet in the next few weeks. These instructions provide you with information on caring for yourself after your procedure. Your caregiver also may give you specific instructions. Your treatment has been planned according to the  most current medical practices, but problems sometimes occur. Call your caregiver if you have any problems or questions after your procedure. HOME CARE INSTRUCTIONS   Your caregiver will give you specific precautions for certain types of movement. Additional instructions include:  Take over-the-counter or prescription medicines for pain, discomfort, or fever only as  directed by your caregiver.   Take quick showers (3 5 min) rather than bathe until your caregiver tells you that you can take baths again.   Avoid lifting until your caregiver instructs you otherwise.   Use a raised toilet seat and avoid sitting in low chairs as instructed by your caregiver.   Use crutches or a walker as instructed by your caregiver.  SEEK MEDICAL CARE IF:  You have difficulty breathing.   Your wound is red, swollen, or has become increasingly painful.   You have pus draining from your wound.   You have a bad smell coming from your wound.   You have persistent bleeding from your wound.   Your wound breaks open after sutures (stitches) or staples have been removed.  SEEK IMMEDIATE MEDICAL CARE IF:    You have a fever.   You have a rash.   You have pain or swelling in your calf or thigh.   You have shortness of breath or chest pain.  MAKE SURE YOU:  Understand these instructions.   Will watch your condition.   Will get help right away if you are not doing well or get worse.  Document Released: 09/02/2004 Document Revised: 08/15/2011 Document Reviewed: 04/02/2011 [facility] Patient Information 2013 [place], [place].

## 2011-12-06 NOTE — Progress Notes (Signed)
Patient ID: Ian Aguilar, male   DOB: April 09, 1940, 71 y.o.   MRN: [id number] Chief Complaint  Patient presents with  . Follow-up    recheck left hip  preop evaluation    The patient has been seen by Dr. Jena Gauss, and no further interventions are needed. He also saw Dr. Phillips Odor.  He has decreased his drinking and smoking.  He is scheduled for LEFT total hip arthroplasty. Details of the history and physical to be included by referencing done at a later date.

## 2011-12-07 ENCOUNTER — Telehealth: Payer: Self-pay | Admitting: Orthopedic Surgery

## 2011-12-07 NOTE — Telephone Encounter (Signed)
Contacted insurer, [employer], AV#[phone], re:  In-patient surgery scheduled at [facility] 12/18/11, CPT 27130, ICD9 code 715.15, 715.96.  Received voice message, with option to fax clinicals re: pre-authorization. Sent to fax# [phone], attention nurse reviewer. Fol/up to verify received, and to obtain further pre-cert.

## 2011-12-08 NOTE — Telephone Encounter (Signed)
Confirmed with [employer], that fax has been received, states is awaiting review, and we will be contacted.

## 2011-12-11 ENCOUNTER — Telehealth: Payer: Self-pay | Admitting: *Deleted

## 2011-12-11 NOTE — Telephone Encounter (Signed)
Referral sent 

## 2011-12-12 NOTE — Telephone Encounter (Addendum)
12/11/11 Received calll back from [facility], nurse reviewer Lynden Oxford.  Approved surgery for 12/18/11, CPT 27130, with the following: Authorization # [id number]; states medical certification only.  # of days to be determined upon medical necessity; hospital to notify accordingly.

## 2011-12-12 NOTE — Patient Instructions (Addendum)
21 Ian Aguilar  12/12/2011   Your procedure is scheduled on:  12/18/2011   Report to [facility] at  900  AM.  Call this number if you have problems the morning of surgery: [phone]   Remember:   Do not eat food:After Midnight.  May have clear liquids:until Midnight .    Take these medicines the morning of surgery with A SIP OF WATER:  Xanax,norco. Take spiriva before you come.   Do not wear jewelry, make-up or nail polish.  Do not wear lotions, powders, or perfumes. You may wear deodorant.  Do not shave 48 hours prior to surgery. Men may shave face and neck.  Do not bring valuables to the hospital.  Contacts, dentures or bridgework may not be worn into surgery.  Leave suitcase in the car. After surgery it may be brought to your room.  For patients admitted to the hospital, checkout time is 11:00 AM the day of discharge.   Patients discharged the day of surgery will not be allowed to drive home.  Name and phone number of your driver: family  Special Instructions: Shower using CHG 2 nights before surgery and the night before surgery.  If you shower the day of surgery use CHG.  Use special wash - you have one bottle of CHG for all showers.  You should use approximately 1/3 of the bottle for each shower.   Please read over the following fact sheets that you were given: Pain Booklet, Coughing and Deep Breathing, Blood Transfusion Information, Total Joint Packet, MRSA Information, Surgical Site Infection Prevention, Anesthesia Post-op Instructions and Care and Recovery After Surgery Total Hip Replacement Total hip replacement is the replacement of your damaged hip with an artificial hip joint (prosthetic hip joint). The purpose of this surgery is to reduce pain and improve your hip function. LET YOUR CAREGIVER KNOW ABOUT:   Any allergies you have.  Any medicines you are taking, including vitamins, herbs, eyedrops, over-the-counter medicines, and creams.  Any problems you have  had with the use of anesthetics.  Family history of problems with the use of anesthetics.  Any blood disorders you have, including bleeding problems or clotting problems.  Previous surgeries you have had. RISKS AND COMPLICATIONS Generally, total hip replacement is a safe procedure. However, as with any surgical procedure, complications can occur. Complications associated with total hip replacement both during and after the procedure include:  Infection.  Dislocation (the ball of the hip-joint prosthesis comes out of contact with the socket).  Loosening of the stem connected to the ball or socket.  Fracture of the bone while inserting the prosthesis.  Formation of blood clots, which can break loose and travel to and injure your lungs (pulmonary embolus). BEFORE THE PROCEDURE   Your caregiver will instruct you when you need to stop eating and drinking.  Ask your caregiver if you need to change or stop any regular medicines. PROCEDURE Just before the procedure, you will receive medicine that will make you drowsy (sedative) or medicine to make you fall asleep (general anesthetic). This will be given through a tube that is inserted into one of your veins (intravenous [IV] tube). Then you will receive medicine to block pain from the waist down through your legs (spinal block). An incision is made in your hip. Your surgeon will take out any damaged cartilage and bone. Next, your surgeon will insert a prosthetic socket into your pelvic bone. This is usually secured with screws. Then, your surgeon  will cut off the ball of your thigh bone (femur) and attach a prosthetic ball on a stem to your femur. The surgeon then places the ball into the socket and checks the range of motion of your new hip. AFTER THE PROCEDURE  You will be taken to the recovery area where a nurse will watch and check your progress. Once you are awake and stable, you will be taken to a hospital room. You will receive physical  therapy until you are doing well and your caregiver feels it is safe for you to go home. Typically, you will stay in the hospital 1 4 days after your procedure. Document Released: 05/22/2000 Document Revised: 08/15/2011 Document Reviewed: 04/02/2011 [facility] Patient Information 2013 [place], [place]. PATIENT INSTRUCTIONS POST-ANESTHESIA  IMMEDIATELY FOLLOWING SURGERY:  Do not drive or operate machinery for the first twenty four hours after surgery.  Do not make any important decisions for twenty four hours after surgery or while taking narcotic pain medications or sedatives.  If you develop intractable nausea and vomiting or a severe headache please notify your doctor immediately.  FOLLOW-UP:  Please make an appointment with your surgeon as instructed. You do not need to follow up with anesthesia unless specifically instructed to do so.  WOUND CARE INSTRUCTIONS (if applicable):  Keep a dry clean dressing on the anesthesia/puncture wound site if there is drainage.  Once the wound has quit draining you may leave it open to air.  Generally you should leave the bandage intact for twenty four hours unless there is drainage.  If the epidural site drains for more than 36-48 hours please call the anesthesia department.  QUESTIONS?:  Please feel free to call your physician or the hospital operator if you have any questions, and they will be happy to assist you.

## 2011-12-13 ENCOUNTER — Encounter (HOSPITAL_COMMUNITY)
Admission: RE | Admit: 2011-12-13 | Discharge: 2011-12-13 | Disposition: A | Discharge status: Home / Self Care | Payer: Medicare Other | Source: Ambulatory Visit | Attending: Orthopedic Surgery | Admitting: Orthopedic Surgery

## 2011-12-13 ENCOUNTER — Other Ambulatory Visit: Payer: Self-pay

## 2011-12-13 ENCOUNTER — Encounter (HOSPITAL_COMMUNITY): Payer: Self-pay

## 2011-12-13 HISTORY — DX: Anxiety disorder, unspecified: F41.9

## 2011-12-13 HISTORY — DX: Unspecified osteoarthritis, unspecified site: M19.90

## 2011-12-13 LAB — BASIC METABOLIC PANEL
BUN: 11 mg/dL (ref 6–23)
CO2: 29 mEq/L (ref 19–32)
Calcium: 10.4 mg/dL (ref 8.4–10.5)
Chloride: 90 mEq/L — ABNORMAL LOW (ref 96–112)
Creatinine, Ser: 1.4 mg/dL — ABNORMAL HIGH (ref 0.50–1.35)
GFR calc Af Amer: 57 mL/min — ABNORMAL LOW (ref >90)
GFR calc non Af Amer: 49 mL/min — ABNORMAL LOW (ref >90)
Glucose, Bld: 107 mg/dL — ABNORMAL HIGH (ref 70–99)
Potassium: 5.5 mEq/L — ABNORMAL HIGH (ref 3.5–5.1)
Sodium: 128 mEq/L — ABNORMAL LOW (ref 135–145)

## 2011-12-13 LAB — CBC WITH DIFFERENTIAL/PLATELET
Basophils Absolute: 0 10*3/uL (ref 0.0–0.1)
Basophils Relative: 0 % (ref 0–1)
Eosinophils Absolute: 0.1 10*3/uL (ref 0.0–0.7)
Eosinophils Relative: 3 % (ref 0–5)
HCT: 40.3 % (ref 39.0–52.0)
Hemoglobin: 14.1 g/dL (ref 13.0–17.0)
Lymphocytes Relative: 32 % (ref 12–46)
Lymphs Abs: 1.4 10*3/uL (ref 0.7–4.0)
MCH: 33.7 pg (ref 26.0–34.0)
MCHC: 35 g/dL (ref 30.0–36.0)
MCV: 96.2 fL (ref 78.0–100.0)
Monocytes Absolute: 0.4 10*3/uL (ref 0.1–1.0)
Monocytes Relative: 10 % (ref 3–12)
Neutro Abs: 2.5 10*3/uL (ref 1.7–7.7)
Neutrophils Relative %: 55 % (ref 43–77)
Platelets: 234 10*3/uL (ref 150–400)
RBC: 4.19 MIL/uL — ABNORMAL LOW (ref 4.22–5.81)
RDW: 15.9 % — ABNORMAL HIGH (ref 11.5–15.5)
WBC: 4.5 10*3/uL (ref 4.0–10.5)

## 2011-12-13 LAB — SURGICAL PCR SCREEN
MRSA, PCR: NEGATIVE
Staphylococcus aureus: NEGATIVE

## 2011-12-13 LAB — APTT: aPTT: 33 seconds (ref 24–37)

## 2011-12-13 LAB — PROTIME-INR
INR: 0.99 (ref 0.00–1.49)
Prothrombin Time: 13 seconds (ref 11.6–15.2)

## 2011-12-13 LAB — PREPARE RBC (CROSSMATCH)

## 2011-12-14 LAB — BASIC METABOLIC PANEL
BUN: 12 mg/dL (ref 6–23)
CO2: 28 mEq/L (ref 19–32)
Calcium: 10.1 mg/dL (ref 8.4–10.5)
Chloride: 91 mEq/L — ABNORMAL LOW (ref 96–112)
Creatinine, Ser: 1.23 mg/dL (ref 0.50–1.35)
GFR calc Af Amer: 67 mL/min — ABNORMAL LOW (ref >90)
GFR calc non Af Amer: 58 mL/min — ABNORMAL LOW (ref >90)
Glucose, Bld: 104 mg/dL — ABNORMAL HIGH (ref 70–99)
Potassium: 4.8 mEq/L (ref 3.5–5.1)
Sodium: 129 mEq/L — ABNORMAL LOW (ref 135–145)

## 2011-12-14 NOTE — Pre-Procedure Instructions (Signed)
Dr Jayme Cloud aware of bmet. Wants this repeated prior to surgery. Patient called so results can be repeated.

## 2011-12-14 NOTE — Pre-Procedure Instructions (Addendum)
Patient coming at 1400 on 12/14/2011 for redraw of Bmet. Dr Mort Sawyers office notified of bmet abnormality and that patient coming back for redraw of blood.Spoke with Haroldine Laws at Dr Harrison's office.

## 2011-12-15 NOTE — OR Nursing (Signed)
Dr. Romeo Apple notified of Na+ , No further orders.

## 2011-12-15 NOTE — OR Nursing (Signed)
Dr. Romeo Apple Beeped in regards to labs,  Na+ now 129

## 2011-12-15 NOTE — Pre-Procedure Instructions (Signed)
Repeat Bmet shown to Dr Reita Chard procede  with surgery. Spoke with Asher Muir at Dr Harrsion's office to let her know.

## 2011-12-17 LAB — ABO/RH: ABO/RH(D): A NEG

## 2011-12-18 ENCOUNTER — Inpatient Hospital Stay (HOSPITAL_COMMUNITY): Payer: Medicare Other | Admitting: Anesthesiology

## 2011-12-18 ENCOUNTER — Encounter (HOSPITAL_COMMUNITY): Payer: Self-pay | Admitting: *Deleted

## 2011-12-18 ENCOUNTER — Inpatient Hospital Stay (HOSPITAL_COMMUNITY): Payer: Medicare Other

## 2011-12-18 ENCOUNTER — Encounter (HOSPITAL_COMMUNITY): Payer: Self-pay | Admitting: Anesthesiology

## 2011-12-18 ENCOUNTER — Encounter (HOSPITAL_COMMUNITY): Payer: Self-pay | Admitting: General Practice

## 2011-12-18 ENCOUNTER — Inpatient Hospital Stay (HOSPITAL_COMMUNITY)
Admission: RE | Admit: 2011-12-18 | Discharge: 2011-12-21 | DRG: 470 | Disposition: A | Discharge status: Home / Self Care | Payer: Medicare Other | Source: Ambulatory Visit | Attending: Orthopedic Surgery | Admitting: Orthopedic Surgery

## 2011-12-18 ENCOUNTER — Encounter (HOSPITAL_COMMUNITY)
Admission: RE | Disposition: A | Discharge status: Home / Self Care | Payer: Self-pay | Source: Ambulatory Visit | Attending: Orthopedic Surgery

## 2011-12-18 DIAGNOSIS — F17200 Nicotine dependence, unspecified, uncomplicated: Secondary | ICD-10-CM | POA: Diagnosis present

## 2011-12-18 DIAGNOSIS — F1010 Alcohol abuse, uncomplicated: Secondary | ICD-10-CM | POA: Diagnosis present

## 2011-12-18 DIAGNOSIS — Z23 Encounter for immunization: Secondary | ICD-10-CM

## 2011-12-18 DIAGNOSIS — M169 Osteoarthritis of hip, unspecified: Secondary | ICD-10-CM

## 2011-12-18 DIAGNOSIS — E785 Hyperlipidemia, unspecified: Secondary | ICD-10-CM | POA: Diagnosis present

## 2011-12-18 DIAGNOSIS — K7030 Alcoholic cirrhosis of liver without ascites: Secondary | ICD-10-CM | POA: Diagnosis present

## 2011-12-18 DIAGNOSIS — F341 Dysthymic disorder: Secondary | ICD-10-CM | POA: Diagnosis present

## 2011-12-18 DIAGNOSIS — J449 Chronic obstructive pulmonary disease, unspecified: Secondary | ICD-10-CM | POA: Diagnosis present

## 2011-12-18 DIAGNOSIS — J4489 Other specified chronic obstructive pulmonary disease: Secondary | ICD-10-CM | POA: Diagnosis present

## 2011-12-18 DIAGNOSIS — D62 Acute posthemorrhagic anemia: Secondary | ICD-10-CM | POA: Diagnosis not present

## 2011-12-18 DIAGNOSIS — M1610 Unilateral primary osteoarthritis, unspecified hip: Principal | ICD-10-CM | POA: Diagnosis present

## 2011-12-18 DIAGNOSIS — Z79899 Other long term (current) drug therapy: Secondary | ICD-10-CM

## 2011-12-18 HISTORY — PX: TOTAL HIP ARTHROPLASTY: SHX124

## 2011-12-18 SURGERY — ARTHROPLASTY, HIP, TOTAL,POSTERIOR APPROACH
Anesthesia: Spinal | Site: Hip | Laterality: Left | Wound class: Clean

## 2011-12-18 MED ORDER — CEFAZOLIN SODIUM-DEXTROSE 2-3 GM-% IV SOLR: 2.0000 g | INTRAVENOUS | Status: DC

## 2011-12-18 MED ORDER — FENTANYL CITRATE 0.05 MG/ML IJ SOLN
INTRAMUSCULAR | Status: AC
  Filled 2011-12-18: qty 2

## 2011-12-18 MED ORDER — BISACODYL 10 MG RE SUPP: 10.0000 mg | Freq: Every day | RECTAL | Status: DC | PRN

## 2011-12-18 MED ORDER — FENTANYL CITRATE 0.05 MG/ML IJ SOLN
INTRAMUSCULAR | Status: DC | PRN
  Administered 2011-12-18: 50 ug via INTRAVENOUS
  Administered 2011-12-18: 30 ug via INTRAVENOUS
  Administered 2011-12-18: 20 ug via INTRATHECAL

## 2011-12-18 MED ORDER — ENOXAPARIN SODIUM 40 MG/0.4ML ~~LOC~~ SOLN: 40.0000 mg | SUBCUTANEOUS | Status: DC

## 2011-12-18 MED ORDER — CEFAZOLIN SODIUM-DEXTROSE 2-3 GM-% IV SOLR
INTRAVENOUS | Status: DC | PRN
  Administered 2011-12-18: 2 g via INTRAVENOUS

## 2011-12-18 MED ORDER — CEFAZOLIN SODIUM 1-5 GM-% IV SOLN
1.0000 g | Freq: Four times a day (QID) | INTRAVENOUS | Status: AC
  Administered 2011-12-18 (×2): 1 g via INTRAVENOUS
  Filled 2011-12-18 (×2): qty 50

## 2011-12-18 MED ORDER — PHENYLEPHRINE HCL 10 MG/ML IJ SOLN
INTRAMUSCULAR | Status: AC
  Filled 2011-12-18: qty 1

## 2011-12-18 MED ORDER — PROPOFOL 10 MG/ML IV EMUL
INTRAVENOUS | Status: AC
  Filled 2011-12-18: qty 20

## 2011-12-18 MED ORDER — INFLUENZA VIRUS VACC SPLIT PF IM SUSP
0.5000 mL | INTRAMUSCULAR | Status: AC
  Administered 2011-12-20: 0.5 mL via INTRAMUSCULAR
  Filled 2011-12-18: qty 0.5

## 2011-12-18 MED ORDER — MIDAZOLAM HCL 2 MG/2ML IJ SOLN
1.0000 mg | INTRAMUSCULAR | Status: DC | PRN
  Administered 2011-12-18: 2 mg via INTRAVENOUS

## 2011-12-18 MED ORDER — PREGABALIN 50 MG PO CAPS
50.0000 mg | ORAL_CAPSULE | Freq: Once | ORAL | Status: AC
  Administered 2011-12-18: 50 mg via ORAL

## 2011-12-18 MED ORDER — PHENOL 1.4 % MT LIQD: 1.0000 | OROMUCOSAL | Status: DC | PRN

## 2011-12-18 MED ORDER — VITAMIN B-1 100 MG PO TABS
100.0000 mg | ORAL_TABLET | Freq: Every day | ORAL | Status: DC
  Administered 2011-12-18 – 2011-12-21 (×4): 100 mg via ORAL
  Filled 2011-12-18 (×4): qty 1

## 2011-12-18 MED ORDER — ONDANSETRON HCL 4 MG/2ML IJ SOLN
4.0000 mg | Freq: Once | INTRAMUSCULAR | Status: AC
  Administered 2011-12-18: 4 mg via INTRAVENOUS

## 2011-12-18 MED ORDER — CHLORHEXIDINE GLUCONATE 4 % EX LIQD: 60.0000 mL | Freq: Once | CUTANEOUS | Status: DC

## 2011-12-18 MED ORDER — DOCUSATE SODIUM 100 MG PO CAPS
100.0000 mg | ORAL_CAPSULE | Freq: Two times a day (BID) | ORAL | Status: DC
  Administered 2011-12-18 – 2011-12-21 (×7): 100 mg via ORAL
  Filled 2011-12-18 (×7): qty 1

## 2011-12-18 MED ORDER — B COMPLEX-C PO TABS
1.0000 | ORAL_TABLET | Freq: Every day | ORAL | Status: DC
  Administered 2011-12-18 – 2011-12-20 (×3): 1 via ORAL
  Filled 2011-12-18 (×6): qty 1

## 2011-12-18 MED ORDER — SENNA 8.6 MG PO TABS
1.0000 | ORAL_TABLET | Freq: Two times a day (BID) | ORAL | Status: DC
  Administered 2011-12-18 – 2011-12-21 (×7): 8.6 mg via ORAL
  Filled 2011-12-18 (×7): qty 1

## 2011-12-18 MED ORDER — MIDAZOLAM HCL 5 MG/5ML IJ SOLN
INTRAMUSCULAR | Status: DC | PRN
  Administered 2011-12-18 (×2): 1 mg via INTRAVENOUS

## 2011-12-18 MED ORDER — MAGNESIUM CITRATE PO SOLN: 1.0000 | Freq: Once | ORAL | Status: AC | PRN

## 2011-12-18 MED ORDER — BUPIVACAINE-EPINEPHRINE PF 0.5-1:200000 % IJ SOLN
INTRAMUSCULAR | Status: DC | PRN
  Administered 2011-12-18: 90 mL

## 2011-12-18 MED ORDER — ACETAMINOPHEN 10 MG/ML IV SOLN
INTRAVENOUS | Status: AC
  Filled 2011-12-18: qty 100

## 2011-12-18 MED ORDER — ACETAMINOPHEN 10 MG/ML IV SOLN
1000.0000 mg | Freq: Once | INTRAVENOUS | Status: AC
  Administered 2011-12-18: 1000 mg via INTRAVENOUS

## 2011-12-18 MED ORDER — METOCLOPRAMIDE HCL 5 MG/ML IJ SOLN: 5.0000 mg | Freq: Three times a day (TID) | INTRAMUSCULAR | Status: DC | PRN

## 2011-12-18 MED ORDER — SENNOSIDES-DOCUSATE SODIUM 8.6-50 MG PO TABS: 1.0000 | ORAL_TABLET | Freq: Every evening | ORAL | Status: DC | PRN

## 2011-12-18 MED ORDER — METHOCARBAMOL 500 MG PO TABS: 500.0000 mg | ORAL_TABLET | Freq: Four times a day (QID) | ORAL | Status: DC | PRN

## 2011-12-18 MED ORDER — ACETAMINOPHEN 10 MG/ML IV SOLN
1000.0000 mg | Freq: Four times a day (QID) | INTRAVENOUS | Status: DC
  Administered 2011-12-18 – 2011-12-19 (×3): 1000 mg via INTRAVENOUS
  Filled 2011-12-18 (×4): qty 100

## 2011-12-18 MED ORDER — METHOCARBAMOL 100 MG/ML IJ SOLN
500.0000 mg | Freq: Once | INTRAVENOUS | Status: AC
  Administered 2011-12-18: 500 mg via INTRAVENOUS
  Filled 2011-12-18: qty 5

## 2011-12-18 MED ORDER — ALPRAZOLAM 0.5 MG PO TABS: 0.5000 mg | ORAL_TABLET | Freq: Four times a day (QID) | ORAL | Status: DC | PRN

## 2011-12-18 MED ORDER — METOCLOPRAMIDE HCL 10 MG PO TABS: 5.0000 mg | ORAL_TABLET | Freq: Three times a day (TID) | ORAL | Status: DC | PRN

## 2011-12-18 MED ORDER — EPHEDRINE SULFATE 50 MG/ML IJ SOLN
INTRAMUSCULAR | Status: DC | PRN
  Administered 2011-12-18 (×5): 10 mg via INTRAVENOUS

## 2011-12-18 MED ORDER — CELECOXIB 100 MG PO CAPS
ORAL_CAPSULE | ORAL | Status: AC
  Filled 2011-12-18: qty 4

## 2011-12-18 MED ORDER — OXYCODONE HCL 5 MG PO TABS
5.0000 mg | ORAL_TABLET | Freq: Once | ORAL | Status: AC
  Administered 2011-12-18: 5 mg via ORAL

## 2011-12-18 MED ORDER — DIPHENHYDRAMINE HCL 12.5 MG/5ML PO ELIX: 12.5000 mg | ORAL_SOLUTION | ORAL | Status: DC | PRN

## 2011-12-18 MED ORDER — PREGABALIN 50 MG PO CAPS
ORAL_CAPSULE | ORAL | Status: AC
  Filled 2011-12-18: qty 1

## 2011-12-18 MED ORDER — CEFAZOLIN SODIUM-DEXTROSE 2-3 GM-% IV SOLR
INTRAVENOUS | Status: AC
  Filled 2011-12-18: qty 50

## 2011-12-18 MED ORDER — MIDAZOLAM HCL 2 MG/2ML IJ SOLN
INTRAMUSCULAR | Status: AC
  Filled 2011-12-18: qty 2

## 2011-12-18 MED ORDER — METHOCARBAMOL 100 MG/ML IJ SOLN
500.0000 mg | Freq: Four times a day (QID) | INTRAVENOUS | Status: DC | PRN
  Filled 2011-12-18: qty 5

## 2011-12-18 MED ORDER — HYDROMORPHONE HCL PF 1 MG/ML IJ SOLN
0.5000 mg | INTRAMUSCULAR | Status: DC | PRN
  Administered 2011-12-19 – 2011-12-20 (×3): 0.5 mg via INTRAVENOUS
  Filled 2011-12-18 (×3): qty 1

## 2011-12-18 MED ORDER — FENTANYL CITRATE 0.05 MG/ML IJ SOLN: 25.0000 ug | INTRAMUSCULAR | Status: DC | PRN

## 2011-12-18 MED ORDER — SODIUM CHLORIDE 0.9 % IV SOLN
INTRAVENOUS | Status: DC
  Administered 2011-12-18 – 2011-12-19 (×2): via INTRAVENOUS

## 2011-12-18 MED ORDER — MENTHOL 3 MG MT LOZG: 1.0000 | LOZENGE | OROMUCOSAL | Status: DC | PRN

## 2011-12-18 MED ORDER — ACETAMINOPHEN 325 MG PO TABS: 650.0000 mg | ORAL_TABLET | Freq: Four times a day (QID) | ORAL | Status: DC | PRN

## 2011-12-18 MED ORDER — TIOTROPIUM BROMIDE MONOHYDRATE 18 MCG IN CAPS
18.0000 ug | ORAL_CAPSULE | Freq: Every day | RESPIRATORY_TRACT | Status: DC
  Administered 2011-12-20 – 2011-12-21 (×2): 18 ug via RESPIRATORY_TRACT
  Filled 2011-12-18 (×2): qty 5

## 2011-12-18 MED ORDER — OXYCODONE HCL 5 MG PO TABS
ORAL_TABLET | ORAL | Status: AC
  Filled 2011-12-18: qty 1

## 2011-12-18 MED ORDER — BUPIVACAINE IN DEXTROSE 0.75-8.25 % IT SOLN
INTRATHECAL | Status: AC
  Filled 2011-12-18: qty 2

## 2011-12-18 MED ORDER — ONDANSETRON HCL 4 MG PO TABS: 4.0000 mg | ORAL_TABLET | Freq: Four times a day (QID) | ORAL | Status: DC | PRN

## 2011-12-18 MED ORDER — PHENYLEPHRINE HCL 10 MG/ML IJ SOLN
INTRAMUSCULAR | Status: DC | PRN
  Administered 2011-12-18 (×2): 100 ug via INTRAVENOUS

## 2011-12-18 MED ORDER — EPHEDRINE SULFATE 50 MG/ML IJ SOLN
INTRAMUSCULAR | Status: AC
  Filled 2011-12-18: qty 1

## 2011-12-18 MED ORDER — LIDOCAINE HCL (CARDIAC) 10 MG/ML IV SOLN
INTRAVENOUS | Status: DC | PRN
  Administered 2011-12-18: 50 mg via INTRAVENOUS

## 2011-12-18 MED ORDER — ALUM & MAG HYDROXIDE-SIMETH 200-200-20 MG/5ML PO SUSP: 30.0000 mL | ORAL | Status: DC | PRN

## 2011-12-18 MED ORDER — PROPOFOL INFUSION 10 MG/ML OPTIME
INTRAVENOUS | Status: DC | PRN
  Administered 2011-12-18: 75 ug/kg/min via INTRAVENOUS

## 2011-12-18 MED ORDER — ONDANSETRON HCL 4 MG/2ML IJ SOLN: 4.0000 mg | Freq: Once | INTRAMUSCULAR | Status: DC | PRN

## 2011-12-18 MED ORDER — CELECOXIB 100 MG PO CAPS
200.0000 mg | ORAL_CAPSULE | Freq: Two times a day (BID) | ORAL | Status: DC
  Administered 2011-12-18 – 2011-12-21 (×7): 200 mg via ORAL
  Filled 2011-12-18 (×3): qty 2
  Filled 2011-12-18: qty 1
  Filled 2011-12-18 (×3): qty 2

## 2011-12-18 MED ORDER — ACETAMINOPHEN 650 MG RE SUPP: 650.0000 mg | Freq: Four times a day (QID) | RECTAL | Status: DC | PRN

## 2011-12-18 MED ORDER — CELECOXIB 100 MG PO CAPS
400.0000 mg | ORAL_CAPSULE | Freq: Once | ORAL | Status: AC
  Administered 2011-12-18: 400 mg via ORAL

## 2011-12-18 MED ORDER — OXYCODONE HCL 5 MG PO TABS
5.0000 mg | ORAL_TABLET | ORAL | Status: DC
  Administered 2011-12-18 – 2011-12-19 (×4): 5 mg via ORAL
  Filled 2011-12-18 (×4): qty 1

## 2011-12-18 MED ORDER — LACTATED RINGERS IV SOLN
INTRAVENOUS | Status: DC | PRN
  Administered 2011-12-18 (×2): via INTRAVENOUS

## 2011-12-18 MED ORDER — LIDOCAINE HCL (PF) 1 % IJ SOLN
INTRAMUSCULAR | Status: AC
  Filled 2011-12-18: qty 5

## 2011-12-18 MED ORDER — SODIUM CHLORIDE 0.9 % IR SOLN
Status: DC | PRN
  Administered 2011-12-18: 3000 mL
  Administered 2011-12-18: 1000 mL

## 2011-12-18 MED ORDER — LACTATED RINGERS IV SOLN
INTRAVENOUS | Status: DC
  Administered 2011-12-18: 10:00:00 via INTRAVENOUS

## 2011-12-18 MED ORDER — ONDANSETRON HCL 4 MG/2ML IJ SOLN
INTRAMUSCULAR | Status: AC
  Filled 2011-12-18: qty 2

## 2011-12-18 MED ORDER — ONDANSETRON HCL 4 MG/2ML IJ SOLN: 4.0000 mg | Freq: Four times a day (QID) | INTRAMUSCULAR | Status: DC | PRN

## 2011-12-18 SURGICAL SUPPLY — 56 items
BIT DRILL 2.8X128 (BIT) ×2 IMPLANT
BLADE HEX COATED 2.75 (ELECTRODE) ×2 IMPLANT
BLADE SAGITTAL 25.0X1.27X90 (BLADE) ×2 IMPLANT
BRUSH FEMORAL CANAL (MISCELLANEOUS) IMPLANT
CATH KIT ON Q 2.5IN SLV (PAIN MANAGEMENT) IMPLANT
CHLORAPREP W/TINT 26ML (MISCELLANEOUS) ×4 IMPLANT
CLOTH BEACON ORANGE TIMEOUT ST (SAFETY) ×2 IMPLANT
COVER LIGHT HANDLE STERIS (MISCELLANEOUS) ×4 IMPLANT
COVER PROBE W GEL 5X96 (DRAPES) ×2 IMPLANT
DECANTER SPIKE VIAL GLASS SM (MISCELLANEOUS) ×4 IMPLANT
DRAPE BACK TABLE (DRAPES) ×2 IMPLANT
DRAPE HIP W/POCKET STRL (DRAPE) ×2 IMPLANT
DRAPE INCISE IOBAN 44X35 STRL (DRAPES) ×2 IMPLANT
DRAPE U-SHAPE 47X51 STRL (DRAPES) ×2 IMPLANT
DRSG MEPILEX BORDER 4X12 (GAUZE/BANDAGES/DRESSINGS) ×2 IMPLANT
ELECT REM PT RETURN 9FT ADLT (ELECTROSURGICAL) ×2
ELECTRODE REM PT RTRN 9FT ADLT (ELECTROSURGICAL) ×1 IMPLANT
FACESHIELD OPICON STD (MASK) ×2 IMPLANT
GLOVE OPTIFIT SS 8.0 STRL (GLOVE) ×2 IMPLANT
GLOVE SKINSENSE NS SZ8.0 LF (GLOVE) ×2
GLOVE SKINSENSE STRL SZ8.0 LF (GLOVE) ×2 IMPLANT
GLOVE SS N UNI LF 8.5 STRL (GLOVE) ×2 IMPLANT
GOWN STRL REIN XL XLG (GOWN DISPOSABLE) ×6 IMPLANT
HANDPIECE INTERPULSE COAX TIP (DISPOSABLE) ×1
HOOD W/PEELAWAY (MISCELLANEOUS) ×8 IMPLANT
INST SET MAJOR BONE (KITS) ×2 IMPLANT
IV NS IRRIG 3000ML ARTHROMATIC (IV SOLUTION) ×2 IMPLANT
KIT BLADEGUARD II DBL (SET/KITS/TRAYS/PACK) ×2 IMPLANT
KIT ROOM TURNOVER APOR (KITS) ×2 IMPLANT
MANIFOLD NEPTUNE II (INSTRUMENTS) ×2 IMPLANT
MARKER SKIN DUAL TIP RULER LAB (MISCELLANEOUS) ×2 IMPLANT
NEEDLE HYPO 21X1.5 SAFETY (NEEDLE) ×2 IMPLANT
NS IRRIG 1000ML POUR BTL (IV SOLUTION) ×2 IMPLANT
PACK TOTAL JOINT (CUSTOM PROCEDURE TRAY) ×2 IMPLANT
PAD ARMBOARD 7.5X6 YLW CONV (MISCELLANEOUS) ×2 IMPLANT
PASSER SUT SWANSON 36MM LOOP (INSTRUMENTS) ×2 IMPLANT
PILLOW HIP ABDUCTION LRG (ORTHOPEDIC SUPPLIES) IMPLANT
PILLOW HIP ABDUCTION MED (ORTHOPEDIC SUPPLIES) ×2 IMPLANT
PIN STMN 9X.142 IN (PIN) ×4 IMPLANT
SET BASIN LINEN APH (SET/KITS/TRAYS/PACK) ×2 IMPLANT
SET HNDPC FAN SPRY TIP SCT (DISPOSABLE) ×1 IMPLANT
SPONGE LAP 18X18 X RAY DECT (DISPOSABLE) ×2 IMPLANT
STAPLER VISISTAT 35W (STAPLE) ×2 IMPLANT
SUT BRALON NAB BRD #1 30IN (SUTURE) ×4 IMPLANT
SUT ETHIBOND 5 LR DA (SUTURE) ×4 IMPLANT
SUT MNCRL 0 VIOLET CTX 36 (SUTURE) ×1 IMPLANT
SUT MON AB 2-0 CT1 36 (SUTURE) ×2 IMPLANT
SUT MONOCRYL 0 CTX 36 (SUTURE) ×1
SUT VIC AB 1 CT1 27 (SUTURE) ×2
SUT VIC AB 1 CT1 27XBRD ANTBC (SUTURE) ×2 IMPLANT
SYR 30ML LL (SYRINGE) ×2 IMPLANT
SYR BULB IRRIGATION 50ML (SYRINGE) ×2 IMPLANT
TOWEL OR 17X26 4PK STRL BLUE (TOWEL DISPOSABLE) ×2 IMPLANT
TOWER CARTRIDGE SMART MIX (DISPOSABLE) IMPLANT
TRAY FOLEY CATH 14FR (SET/KITS/TRAYS/PACK) ×2 IMPLANT
YANKAUER SUCT 12FT TUBE ARGYLE (SUCTIONS) ×2 IMPLANT

## 2011-12-18 NOTE — H&P (Signed)
TOTAL HIP ADMISSION H&P   Patient is admitted for left total hip arthroplasty.   Subjective:   Chief Complaint: left hip pain   HPI: Ian Aguilar, 71 y.o. male, has a history of pain and functional disability in the left hip(s) due to arthritis and patient has failed non-surgical conservative treatments for greater than 12 weeks to include NSAID's and/or analgesics, use of assistive devices and activity modification.  Onset of symptoms was gradual starting 5 years ago with gradually worsening course since that time.The patient noted no past surgery on the left hip(s).  Patient currently rates pain in the left hip at 8 out of 10 with activity. Patient has night pain, worsening of pain with activity and weight bearing, pain that interfers with activities of daily living, pain with passive range of motion and crepitus. Patient has evidence of subchondral cysts, subchondral sclerosis and joint space narrowing by imaging studies. This condition presents safety issues increasing the risk of falls. This patient has had no other treatment .  There is no current active infection.    Patient Active Problem List     Diagnosis  Date Noted   .  Cirrhosis  09/08/2011   .  Umbilical hernia  09/08/2011   .  OA (osteoarthritis) of hip  09/01/2011   .  Hepatomegaly  06/14/2011   .  Encounter for screening colonoscopy  06/14/2011   .  JOINT EFFUSION, KNEE  02/10/2008   .  KNEE PAIN  02/10/2008       Past Medical History   Diagnosis  Date   .  COPD (chronic obstructive pulmonary disease)     .  ETOH abuse     .  Cirrhosis         confirmed by MRI on 07/04/11.  no immunizations   .  Hyperlipidemia     .  Depression with anxiety         Past Surgical History   Procedure  Date   .  Knee surgery         left   .  Inguinal hernia repair     .  Skull fracture elevation     .  Broken arm     .  External ear surgery     .  Wisdom tooth extraction     .  Bilateral cataract surgery     .   Colonoscopy  1996       Rehman: external hemorrhoids, no polyps   .  Colonoscopy  06/28/2011       Dr. Claudius Sis diverticulosis,tubular adenoma, hyperplastic polyp   .  Esophagogastroduodenoscopy  06/28/2011       Dr. Dellie Catholic erosive reflux esophagitis, hiatal hernia-gastritis         (Not in a hospital admission) No Known Allergies   History   Substance Use Topics   .  Smoking status:  Current Every Day Smoker -- 0.5 packs/day       Types:  Cigarettes   .  Smokeless tobacco:  Not on file     Comment: 4-10 a day   .  Alcohol Use:  Yes          beer only. 6-8 bottles per day.        Family History   Problem  Relation  Age of Onset   .  Colon cancer  Neg Hx          Current outpatient prescriptions:ALPRAZolam (XANAX) 0.5 MG tablet, Take 0.25-0.5 mg  by mouth every 6 (six) hours as needed. For anxiety  Pt takes 1/2 tablet most nights, Disp: , Rfl: ;  buPROPion (WELLBUTRIN XL) 150 MG 24 hr tablet, Take 150 mg by mouth every morning. , Disp: , Rfl: ;  HYDROcodone-acetaminophen (NORCO) 5-325 MG per tablet, Take 1 tablet by mouth every 4 (four) hours as needed for pain., Disp: 60 tablet, Rfl: 1 meloxicam (MOBIC) 15 MG tablet, Take 15 mg by mouth daily., Disp: , Rfl: ;  tiotropium (SPIRIVA) 18 MCG inhalation capsule, Place 18 mcg into inhaler and inhale daily., Disp: , Rfl:    Review of Systems  Constitutional: Negative.   HENT: Negative.   Eyes: Negative.   Respiratory: Positive for cough.   Cardiovascular: Negative.   Gastrointestinal: Negative.   Genitourinary: Negative.   Musculoskeletal: Positive for myalgias, back pain, joint pain and falls.  Skin: Negative.   Neurological: Positive for dizziness.        Neurology evaluation has been completed prior to surgery and there is no contraindication to surgical treatment. The patient indicates that his dizziness and falls have resolved  Endo/Heme/Allergies: Bruises/bleeds easily.      Objective:   Physical Exam  Nursing  note and vitals reviewed. Constitutional: He is oriented to person, place, and time. He appears well-developed and well-nourished.         The patient has a thin ectomorphic body habitus  HENT:   Head: Normocephalic and atraumatic.  Eyes: Conjunctivae normal and EOM are normal. Right eye exhibits no discharge. Left eye exhibits no discharge. No scleral icterus.  Neck: Normal range of motion. No JVD present. No tracheal deviation present. No thyromegaly present.  Cardiovascular: Normal rate and intact distal pulses.   Respiratory: Effort normal and breath sounds normal. No stridor.  GI: Soft. Bowel sounds are normal. He exhibits no distension.  Lymphadenopathy:    He has no cervical adenopathy.  Neurological: He is alert and oriented to person, place, and time. He has normal reflexes. He displays normal reflexes. No cranial nerve deficit. He exhibits normal muscle tone. Coordination normal.  Skin: Skin is warm and dry. No rash noted. No erythema. No pallor.  Psychiatric: He has a normal mood and affect. His behavior is normal. Judgment and thought content normal.  Right Hip Exam    Tenderness  The patient is experiencing no tenderness.        Range of Motion  Extension: abnormal   Flexion: abnormal   Internal Rotation: abnormal   External Rotation: abnormal   Abduction: abnormal   Adduction: abnormal    Muscle Strength  Abduction: 5/5   Adduction: 5/5   Flexion: 5/5    Tests  FABER: negative   Other  Erythema: absent Scars: absent Sensation: normal Pulse: present     Left Hip Exam    Tenderness  The patient is experiencing no tenderness.        Range of Motion  Extension: abnormal   Flexion: abnormal   Internal Rotation: abnormal   External Rotation: abnormal   Abduction: abnormal   Adduction: abnormal    Muscle Strength  Abduction: 5/5   Adduction: 5/5   Flexion: 5/5    Tests  FABER: negative   Other  Erythema: absent Sensation:  normal Pulse: present   Comments:  The patient exhibited several tractors at finding of osteoarthritis of the hip with painful internal rotation and groin pain with hip flexion and internal rotation. The LEFT leg is slightly shorter than the RIGHT, but not  enough to warrant any frank leg lengthening.         Extremities are normal.   Vital signs in last 24 hours: @VSRANGES @   Labs:     Estimated Body mass index is 22.45 kg/(m^2) as calculated from the following:   Height as of this encounter: 5\' 11" (1.803 m).   Weight as of this encounter: 161 lb(73.029 kg).     Imaging Review Plain radiographs demonstrate moderate degenerative joint disease of the left hip(s). The bone quality appears to be good for age and reported activity level.   Assessment/Plan:   End stage arthritis, left hip(s)   The patient history, physical examination, clinical judgement of the provider and imaging studies are consistent with end stage degenerative joint disease of the left hip(s) and left total hip arthroplasty is deemed medically necessary. The treatment options including medical management, injection therapy, arthroscopy and arthroplasty were discussed at length. The risks and benefits of total hip arthroplasty were presented and reviewed. The risks due to aseptic loosening, infection, stiffness, dislocation/subluxation,  thromboembolic complications and other imponderables were discussed.  The patient acknowledged the explanation, agreed to proceed with the plan and consent was signed. Patient is being admitted for inpatient treatment for surgery, pain control, PT, OT, prophylactic antibiotics, VTE prophylaxis, progressive ambulation and ADL's and discharge planning.The patient is planning to be discharged home with home health services

## 2011-12-18 NOTE — Preoperative (Signed)
Beta Blockers   Reason not to administer Beta Blockers:Not Applicable 

## 2011-12-18 NOTE — Brief Op Note (Signed)
12/18/2011  1:32 PM  PATIENT:  Nigel Mormon  71 y.o. male  PRE-OPERATIVE DIAGNOSIS:  left hip osteoarthritis  POST-OPERATIVE DIAGNOSIS:  left hip osteoarthritis  PROCEDURE:  Procedure(s) (LRB) with comments: TOTAL HIP ARTHROPLASTY (Left)  DEPUY PINNACLE GRIPTION CUP 56, 36/1.5 HEAD AND 36 XL POLY AND SIZE 8 TRILOCK STEM   FINDINGS: SEVERE OA WITH LOOSE BODIES SEVERE HEAD DEFORMITY AND ACETABULAR CARTILAGE LOSS  SURGEON:  Surgeon(s) and Role:    * Vickki Hearing, MD - Primary  PHYSICIAN ASSISTANT:   ASSISTANTS: debbie dallas and betty ashley    ANESTHESIA:   spinal  EBL:  Total I/O In: 1000 [I.V.:1000] Out: 750 [Urine:550; Blood:200]  BLOOD ADMINISTERED:none  DRAINS: none   LOCAL MEDICATIONS USED:  MARCAINE   , Amount: 90 ml and OTHER epi  SPECIMEN:  No Specimen  DISPOSITION OF SPECIMEN:  N/A  COUNTS:  YES  TOURNIQUET:  * No tourniquets in log *  DICTATION: .Dragon Dictation  PLAN OF CARE: Admit to inpatient   PATIENT DISPOSITION:  PACU - hemodynamically stable.   Delay start of Pharmacological VTE agent (>24hrs) due to surgical blood loss or risk of bleeding: yes

## 2011-12-18 NOTE — Anesthesia Postprocedure Evaluation (Signed)
  Anesthesia Post-op Note  Patient: Ian Aguilar  Procedure(s) Performed: Procedure(s) (LRB) with comments: TOTAL HIP ARTHROPLASTY (Left)  Patient Location: PACU  Anesthesia Type: spinal  Level of Consciousness: awake, alert , oriented and patient cooperative  Airway and Oxygen Therapy: Patient Spontanous Breathing and Patient connected to face mask oxygen  Post-op Pain: mild  Post-op Assessment: Post-op Vital signs reviewed, Patient's Cardiovascular Status Stable, Respiratory Function Stable, Patent Airway and No signs of Nausea or vomiting  Post-op Vital Signs: Reviewed and stable  Complications: No apparent anesthesia complications

## 2011-12-18 NOTE — Transfer of Care (Signed)
  Anesthesia Post-op Note  Patient: Ian Aguilar  Procedure(s) Performed: Procedure(s) (LRB) with comments: TOTAL HIP ARTHROPLASTY (Left)  Patient Location: PACU  Anesthesia Type: spinal  Level of Consciousness: awake, alert , oriented and patient cooperative  Airway and Oxygen Therapy: Patient Spontanous Breathing and Patient connected to face mask oxygen  Post-op Pain: mild  Post-op Assessment: Post-op Vital signs reviewed, Patient's Cardiovascular Status Stable, Respiratory Function Stable, Patent Airway and No signs of Nausea or vomiting  Post-op Vital Signs: Reviewed and stable  Complications: No apparent anesthesia complications  

## 2011-12-18 NOTE — Interval H&P Note (Signed)
History and Physical Interval Note:  12/18/2011 10:18 AM  Ian Aguilar  has presented today for surgery, with the diagnosis of left hip osteoarthritis  The various methods of treatment have been discussed with the patient and family. After consideration of risks, benefits and other options for treatment, the patient has consented to  Procedure(s) (LRB) with comments: TOTAL HIP ARTHROPLASTY (Left) as a surgical intervention .  The patient's history has been reviewed, patient examined, no change in status, stable for surgery.  I have reviewed the patient's chart and labs.  Questions were answered to the patient's satisfaction.    LEFT [name]

## 2011-12-18 NOTE — Anesthesia Procedure Notes (Signed)
Spinal  Patient location during procedure: OR Start time: 12/18/2011 11:08 AM Staffing CRNA/Resident: ANDRAZA, Sinthia Karabin L Preanesthetic Checklist Completed: patient identified, site marked, surgical consent, pre-op evaluation, timeout performed, IV checked, risks and benefits discussed and monitors and equipment checked Spinal Block Patient position: left lateral decubitus Prep: Betadine Patient monitoring: heart rate, cardiac monitor, continuous pulse ox and blood pressure Approach: left paramedian Location: L3-4 Injection technique: single-shot Needle Needle type: Spinocan  Needle gauge: 22 G Needle length: 9 cm Assessment Sensory level: T8 Additional Notes  ATTEMPTS:1 TRAY ZO:[id number] TRAY EXPIRATION DATE:08/2012  Marcaine15 mg fentanyl 20 mcg epi.1 injected intrathecally at 1108

## 2011-12-18 NOTE — Anesthesia Preprocedure Evaluation (Signed)
Anesthesia Evaluation  Patient identified by MRN, date of birth, ID band Patient awake    Reviewed: Allergy & Precautions, H&P , NPO status , Patient's Chart, lab work & pertinent test results  Airway Mallampati: II      Dental  (+) Poor Dentition, Edentulous Upper and Partial Lower   Pulmonary COPD (hx benign pulm nodule)Current Smoker,  breath sounds clear to auscultation        Cardiovascular negative cardio ROS  Rhythm:Regular     Neuro/Psych PSYCHIATRIC DISORDERS Anxiety    GI/Hepatic (+) Cirrhosis -    substance abuse  alcohol use,   Endo/Other    Renal/GU      Musculoskeletal   Abdominal   Peds  Hematology   Anesthesia Other Findings   Reproductive/Obstetrics                           Anesthesia Physical Anesthesia Plan  ASA: III  Anesthesia Plan: Spinal   Post-op Pain Management:    Induction:   Airway Management Planned: Nasal Cannula  Additional Equipment:   Intra-op Plan:   Post-operative Plan:   Informed Consent: I have reviewed the patients History and Physical, chart, labs and discussed the procedure including the risks, benefits and alternatives for the proposed anesthesia with the patient or authorized representative who has indicated his/her understanding and acceptance.     Plan Discussed with:   Anesthesia Plan Comments:         Anesthesia Quick Evaluation

## 2011-12-18 NOTE — Op Note (Signed)
Preop diagnosis osteoarthritis LEFT  hip Postop diagnosis same Procedure LEFT  total hip arthroplasty Surgeon Romeo Apple Assisted by Valetta Close AND BETTY ASHLEY  Spinal anesthetic Implants: DEPUY SIZE 8 TRILOCK HIGH OFFSET STEM, 56 CUP AND 36/1.5 HEAD AND 36 XL LINER  Femur:8 ACETABULUM:56 POLY36 XL HOLE ELIMINATOR: YES HEAD: 26/1.5  Indications disabling pain LEFT  hip secondary to osteoarthritis.  The patient was identified in the preop holding area and the surgical site was marked and countersigned by the surgeon, the chart was updated. The consent was signed.  The patient was taken to the operating room for spinal anesthetic and then placed in the lateral decubitus position with appropriate padding and axillary roll. 2g of ANCEF were given because of the patient's weight of <80KG After sterile prep and drape the timeout was executed  A lateral incision was made centered over the LEFT greater trochanter to perform a direct lateral approach to the hip. After dividing the subcutaneous tissue down to the fascia, the fascia was split in line with the skin incision. Electrocautery was used to obtain hemostasis.   After deep retractors were placed, the gluteus medius was defined and the anterior half was peeled from the group trochanter in continuity with the vastus lateralis; the anterior branch to the femoral circumflex artery was cauterized. Subperiosteal dissection continued until the gluteus minimus along with the gluteus medius was retracted proximally.  2 Steinmann pins were placed in the pelvis to retract the Glutei. The hip was dislocated anteriorly, a provisional femoral neck cut was made using the cutting guide. The lesser trochanter was then identified with further soft tissue dissection and a second femoral neck cut was made using the same guide.   A box osteotome was used to lateralize entry point into the femoral canal, this was followed by a femoral canal finder and subsequent  broaching up to a size 8 femur.  The acetabulum was then cleared of all soft tissue and anterior and posterior retractors were placed. Direct medial reaming was performed with a 43, and 44 mm reamer. Once the medial wall was identified, positional reaming continued up to a size 55mm, with a goal of 40 degrees abduction and anatomic anteversion. A trial 54 mm SHELL was placed to confirm adequate depth. Once this was accomplished a 56 mm cup was placed in press-fit fashion and confirmed to be stable. (screws NO)  Trial reductions were then performed starting with 1.5. Leg length and stability were restored with 1.5, 36 HEAD . We confirmed knee flexion past 90. ROM: 5 hyperextension with 50 of external rotation. We had adequate shuck test. Sleeping position was stable. At 90 flexion the hip internally rotated 45 without dislocation.  The trial components were then removed, the polyethylene size 36 was secured; drill holes were placed in the trochanter and  #5 sutures were passed through the drill holes; the stem was placed followed by the femoral head.  The hip was reduced. The ROM tests were repeated and were satisfactory.The # 5 sutures were used to repair the abductors with the leg slightly internally rotated.  The wound was irrigated and 30 cc of Marcaine with epinephrine was injected into the sub-gluteus medius area. Fascia was closed with the leg abducted with #1 Bralon sutures; followed by subfascial injection of 30 cc of Marcaine with epinephrine.  Subcutaneous tissue was closed with 0  Monocryl  Skin staples were used to reapproximate the skin edges and a sterile dressing was applied.   The patient was taken  to the recovery room in stable condition.  Standard postop total hip arthroplasty protocol for direct lateral approach.

## 2011-12-19 LAB — BASIC METABOLIC PANEL
BUN: 13 mg/dL (ref 6–23)
CO2: 26 mEq/L (ref 19–32)
Calcium: 8.6 mg/dL (ref 8.4–10.5)
Chloride: 95 mEq/L — ABNORMAL LOW (ref 96–112)
Creatinine, Ser: 1.22 mg/dL (ref 0.50–1.35)
GFR calc Af Amer: 68 mL/min — ABNORMAL LOW (ref >90)
GFR calc non Af Amer: 58 mL/min — ABNORMAL LOW (ref >90)
Glucose, Bld: 109 mg/dL — ABNORMAL HIGH (ref 70–99)
Potassium: 5 mEq/L (ref 3.5–5.1)
Sodium: 129 mEq/L — ABNORMAL LOW (ref 135–145)

## 2011-12-19 LAB — CBC
HCT: 22.4 % — ABNORMAL LOW (ref 39.0–52.0)
Hemoglobin: 7.7 g/dL — ABNORMAL LOW (ref 13.0–17.0)
MCH: 33.5 pg (ref 26.0–34.0)
MCHC: 34.4 g/dL (ref 30.0–36.0)
MCV: 97.4 fL (ref 78.0–100.0)
Platelets: 138 10*3/uL — ABNORMAL LOW (ref 150–400)
RBC: 2.3 MIL/uL — ABNORMAL LOW (ref 4.22–5.81)
RDW: 16.4 % — ABNORMAL HIGH (ref 11.5–15.5)
WBC: 3 10*3/uL — ABNORMAL LOW (ref 4.0–10.5)

## 2011-12-19 MED ORDER — OXYCODONE-ACETAMINOPHEN 5-325 MG PO TABS
1.0000 | ORAL_TABLET | ORAL | Status: DC | PRN
  Administered 2011-12-19: 1 via ORAL
  Filled 2011-12-19 (×2): qty 1

## 2011-12-19 NOTE — Progress Notes (Signed)
UR chart review completed.  

## 2011-12-19 NOTE — Progress Notes (Deleted)
Pt's daughter provided with discharge instructions.  Pt's daughter verbalized understanding of discharge instructions and prescription.  Home Health to follow patient at home.

## 2011-12-19 NOTE — Progress Notes (Addendum)
Subjective: 1 Day Post-Op Procedure(s) (LRB): TOTAL HIP ARTHROPLASTY (Left) Patient reports pain as mild.  As long as medicated on oral medication   Objective: Vital signs in last 24 hours: Temp:  [96.8 F (36 C)-97.9 F (36.6 C)] 97.5 F (36.4 C) (10/22 0701) Pulse Rate:  [74-99] 74  (10/22 0701) Resp:  [15-38] 17  (10/22 0701) BP: (98-176)/(55-100) 111/65 mmHg (10/22 0701) SpO2:  [92 %-100 %] 97 % (10/22 0701) Weight:  [162 lb (73.483 kg)] 162 lb (73.483 kg) (10/21 1649)  Intake/Output from previous day: 10/21 0701 - 10/22 0700 In: 3350 [I.V.:2950; IV Piggyback:400] Out: 1450 [Urine:1250; Blood:200] Intake/Output this shift:     Basename 12/19/11 0609  HGB 7.7*    Basename 12/19/11 0609  WBC 3.0*  RBC 2.30*  HCT 22.4*  PLT 138*    Basename 12/19/11 0609  NA 129*  K 5.0  CL 95*  CO2 26  BUN 13  CREATININE 1.22  GLUCOSE 109*  CALCIUM 8.6   No results found for this basename: LABPT:2,INR:2 in the last 72 hours  Neurologically intact Neurovascular intact Sensation intact distally Intact pulses distally Dorsiflexion/Plantar flexion intact  Assessment/Plan: 1 Day Post-Op Procedure(s) (LRB): TOTAL HIP ARTHROPLASTY (Left) Advance diet Up with therapy D/C IV fluids Hg 7.7 seems very low  Blood loss was not that bad  Assess after PT  Hold lovenox  Place foot pumps   [name] 12/19/2011, 7:32 AM

## 2011-12-19 NOTE — Progress Notes (Signed)
Pt has received 4 g of Acetaminophen within the past 24 hours and that his PRN pain medication would put pt over this limit.  Dr. Romeo Apple notified.  Informed Dr. Romeo Apple that pt was due to receive Ofirmiv at 1200.  Doctor asked if patient had received 3 doses of the Ofirmiv.  Pt has received 3 doses already.  Order received to discontinue 1200 dose of Ofirmiv and proceed with PRN pain tablet.  Orders followed.

## 2011-12-19 NOTE — Clinical Social Work Note (Signed)
CSW received referral for SNF. Pt evaluated by PT and recommendation is for home health PT. CSW signing off but can be reconsulted if needed.  Derenda Fennel, [place] [phone]

## 2011-12-19 NOTE — Addendum Note (Signed)
Addendum  created 12/19/11 0754 by Marolyn Hammock, CRNA   Modules edited:Notes Section

## 2011-12-19 NOTE — Anesthesia Postprocedure Evaluation (Signed)
  Anesthesia Post-op Note  Patient: Ian Aguilar  Procedure(s) Performed: Procedure(s) (LRB) with comments: TOTAL HIP ARTHROPLASTY (Left)  Patient Location: Room 312  Anesthesia Type: spinal  Level of Consciousness: awake, alert , oriented and patient cooperative  Airway and Oxygen Therapy: Patient Spontanous Breathing and Patient connected to Nasal cannula  Post-op Pain: mild  Post-op Assessment: Post-op Vital signs reviewed, Patient's Cardiovascular Status Stable, Respiratory Function Stable, Patent Airway and No signs of Nausea or vomiting  Post-op Vital Signs: Reviewed and stable  Complications: No apparent anesthesia complications

## 2011-12-19 NOTE — Progress Notes (Signed)
Physical Therapy Treatment Patient Details Name: Ian Aguilar MRN: [id number] DOB: 15-Sep-1940 Today's Date: 12/19/2011 Time: 4098-1191 PT Time Calculation (min): 23 min  PT Assessment / Plan / Recommendation Comments on Treatment Session  Pt continues to do extremely well.  Has only mild soreness L hip from gait this AM.  He was again instructed in ant hip precautions.  Will attempt to instruct in steps tomorrow.    Follow Up Recommendations        Does the patient have the potential to tolerate intense rehabilitation     Barriers to Discharge        Equipment Recommendations       Recommendations for Other Services    Frequency     Plan Discharge plan remains appropriate;Frequency remains appropriate    Precautions / Restrictions     Pertinent Vitals/Pain     Mobility  Bed Mobility Supine to Sit: 5: Supervision;HOB flat Sit to Supine: 5: Supervision;HOB flat Transfers Sit to Stand: 5: Supervision;With upper extremity assist Stand to Sit: 5: Supervision;To bed Ambulation/Gait Ambulation Distance (Feet): 15 Feet Assistive device: Rolling walker Gait Pattern: Antalgic General Gait Details: gait now antalgic L Stairs: No Wheelchair Mobility Wheelchair Mobility: No    Exercises Total Joint Exercises Ankle Circles/Pumps: AROM;Both;10 reps;Supine Quad Sets: AROM;Both;10 reps;Supine Gluteal Sets: AROM;Both;10 reps;Supine Short Arc Quad: AROM;Left;10 reps;Supine Heel Slides: AROM;Left;10 reps;Supine Hip ABduction/ADduction:  (instructed to maintain neutral rotation)   PT Diagnosis:    PT Problem List:   PT Treatment Interventions:     PT Goals    Visit Information  Last PT Received On: 12/19/11    Subjective Data  Subjective: My hip is just a little sore   Cognition       Balance     End of Session PT - End of Session Equipment Utilized During Treatment: Gait belt Activity Tolerance: Patient tolerated treatment well;Patient limited by  fatigue Patient left: in bed;with call bell/phone within reach;with bed alarm set   GP     Konrad Penta 12/19/2011, 2:58 PM

## 2011-12-19 NOTE — Progress Notes (Deleted)
Pt's daughter requesting that foley catheter remain until patient gets home.  Nurse spoke with case management.  Case manager, Babette Relic, stated that home health nurse would be out to see patient tomorrow or Thursday.  Dr. Juanetta Gosling notified of pt's daughter request.  Dr. Juanetta Gosling stated that patient could return home with foley catheter and that home health nurse could remove catheter on Wednesday or Thursday.  Case manager and family aware.

## 2011-12-19 NOTE — Evaluation (Signed)
Physical Therapy Evaluation Patient Details Name: Ian Aguilar MRN: [id number] DOB: [birth year] Today's Date: 12/19/2011 Time: 8295-6213 PT Time Calculation (min): 40 min  PT Assessment / Plan / Recommendation Clinical Impression  Pt was seen for initial eval/tx.  He reports no hip pain currently.  His functional ability is amazingly good today. Almost independent in and out of bed, ambulated 9' with a near normal gait pattern (PWB L).  His biggest challenge  is with steps at 2 areas in his home : he has steps to bedroom and kitchen areas, rails available.  Based on his performance today, he will probably not have any difficulty with these.  He was instructed in anterior hip precautions.    PT Assessment  Patient needs continued PT services    Follow Up Recommendations  Home health PT    Does the patient have the potential to tolerate intense rehabilitation      Barriers to Discharge Inaccessible home environment split level home    Equipment Recommendations  Rolling walker with 5" wheels;3 in 1 bedside comode    Recommendations for Other Services OT consult   Frequency 7X/week    Precautions / Restrictions Precautions Precautions: Anterior Hip Precaution Booklet Issued: Yes (comment) Restrictions Weight Bearing Restrictions: Yes LLE Weight Bearing: Weight bearing as tolerated   Pertinent Vitals/Pain       Mobility  Bed Mobility Bed Mobility: Supine to Sit;Sit to Supine Supine to Sit: 4: Min guard;HOB elevated Sit to Supine: 4: Min guard;HOB flat Details for Bed Mobility Assistance: pt instructed in correct technique to protect L hip Transfers Transfers: Sit to Stand;Stand to Sit Sit to Stand: 6: Modified independent (Device/Increase time) Stand to Sit: 6: Modified independent (Device/Increase time) Details for Transfer Assistance: no antalgia with transfers Ambulation/Gait Ambulation Distance (Feet): 80 Feet Assistive device: Rolling walker Gait Pattern:  Within Functional Limits;Step-through pattern Gait velocity: WNL General Gait Details: pt's gait pattern is nearly normal! Stairs: No Wheelchair Mobility Wheelchair Mobility: No    Shoulder Instructions     Exercises Total Joint Exercises Ankle Circles/Pumps: AROM;Both;10 reps;Supine Quad Sets: AROM;Both;10 reps;Supine Gluteal Sets: AROM;Both;10 reps;Supine Short Arc Quad: AROM;Left;10 reps;Supine Heel Slides: AAROM;Left;10 reps;Supine   PT Diagnosis: Difficulty walking;Acute pain  PT Problem List: Decreased strength;Decreased range of motion;Decreased activity tolerance;Decreased mobility;Decreased knowledge of use of DME;Decreased safety awareness;Decreased knowledge of precautions;Pain PT Treatment Interventions: DME instruction;Gait training;Stair training;Functional mobility training;Therapeutic exercise;Therapeutic activities;Patient/family education   PT Goals Acute Rehab PT Goals PT Goal Formulation: With patient Time For Goal Achievement: 12/26/11 Potential to Achieve Goals: Good Pt will go Supine/Side to Sit: with modified independence;with HOB 0 degrees PT Goal: Supine/Side to Sit - Progress: Goal set today Pt will go Sit to Supine/Side: with modified independence;with HOB 0 degrees PT Goal: Sit to Supine/Side - Progress: Goal set today Pt will Ambulate: 51 - 150 feet;with modified independence;with rolling walker PT Goal: Ambulate - Progress: Goal set today Pt will Go Up / Down Stairs: 6-9 stairs;with supervision;with rail(s);with cane PT Goal: Up/Down Stairs - Progress: Goal set today Additional Goals Additional Goal #1: full understanding of anterior hip precautions PT Goal: Additional Goal #1 - Progress: Goal set today  Visit Information  Last PT Received On: 12/19/11    Subjective Data  Subjective: I have no pain right now Patient Stated Goal: return home to independence   Prior Functioning  Home Living Lives With: Spouse Available Help at Discharge:  Family;Available 24 hours/day Type of Home: House Home Access: Level entry Home Layout: Multi-level  Alternate Level Stairs-Number of Steps: 8 steps to bedroom with 2 rails available, 5 steps at kitchen with 1 rail Bathroom Shower/Tub: Engineer, manufacturing systems: Standard Home Adaptive Equipment: Wheelchair - manual Prior Function Level of Independence: Independent Able to Take Stairs?: Yes Driving: Yes Vocation: Retired Musician: No difficulties    Cognition  Overall Cognitive Status: Appears within functional limits for tasks assessed/performed Arousal/Alertness: Awake/alert Orientation Level: Appears intact for tasks assessed Behavior During Session: [facility] for tasks performed    Extremity/Trunk Assessment Right Lower Extremity Assessment RLE ROM/Strength/Tone: Within functional levels RLE Sensation: WFL - Light Touch RLE Coordination: WFL - gross motor Left Lower Extremity Assessment LLE ROM/Strength/Tone: Deficits LLE ROM/Strength/Tone Deficits: hip flex to 60 deg supine, strength at hip 3-/5 LLE Sensation: WFL - Light Touch   Balance Balance Balance Assessed: No  End of Session PT - End of Session Equipment Utilized During Treatment: Gait belt Activity Tolerance: Patient tolerated treatment well Patient left: in bed;with call bell/phone within reach;with nursing in room (chair not available..have requested nursing get one!) Nurse Communication: Mobility status  GP     Konrad Penta 12/19/2011, 9:52 AM

## 2011-12-20 ENCOUNTER — Encounter (HOSPITAL_COMMUNITY): Payer: Self-pay | Admitting: Orthopedic Surgery

## 2011-12-20 LAB — CBC
HCT: 19.6 % — ABNORMAL LOW (ref 39.0–52.0)
Hemoglobin: 6.8 g/dL — CL (ref 13.0–17.0)
MCH: 33.8 pg (ref 26.0–34.0)
MCHC: 34.7 g/dL (ref 30.0–36.0)
MCV: 97.5 fL (ref 78.0–100.0)
Platelets: 134 10*3/uL — ABNORMAL LOW (ref 150–400)
RBC: 2.01 MIL/uL — ABNORMAL LOW (ref 4.22–5.81)
RDW: 16.6 % — ABNORMAL HIGH (ref 11.5–15.5)
WBC: 3.5 10*3/uL — ABNORMAL LOW (ref 4.0–10.5)

## 2011-12-20 MED ORDER — OXYCODONE-ACETAMINOPHEN 5-325 MG PO TABS
1.0000 | ORAL_TABLET | ORAL | Status: DC
  Administered 2011-12-20 – 2011-12-21 (×6): 1 via ORAL
  Filled 2011-12-20 (×6): qty 1

## 2011-12-20 NOTE — Care Management Note (Signed)
    Page 1 of 2   12/21/2011     2:45:37 PM   CARE MANAGEMENT NOTE 12/21/2011  Patient:  KIRBY, CORTESE   Account Number:  [id number]  Date Initiated:  12/20/2011  Documentation initiated by:  Sharrie Rothman  Subjective/Objective Assessment:   Pt admitted from home s/p left hip surgery. Pt lives with his wife and will return home at discharge. Pt already has [facility] prearranged per Dr. Diamantina Providence office pre surgery.     Action/Plan:   Alroy Bailiff of [facility] is aware and will collect the pts information from the chart. Pt will need 3 N 1 and possibly rolling walker if unable to obtain one from family. CM will confirm arrangements prior to D/C.   Anticipated DC Date:  12/21/2011   Anticipated DC Plan:  HOME W HOME HEALTH SERVICES      DC Planning Services  CM consult      [facility] Choice  HOME HEALTH  DURABLE MEDICAL EQUIPMENT   Choice offered to / List presented to:  C-1 Patient   DME arranged  3-N-1      DME agency  [facility]     [facility] arranged  HH-1 RN  HH-2 PT  HH-3 OT  HH-4 NURSE'S AIDE      HH agency  [facility]   Status of service:  Completed, signed off Medicare Important Message given?  YES (If response is "NO", the following Medicare IM given date fields will be blank) Date Medicare IM given:  12/21/2011 Date Additional Medicare IM given:    Discharge Disposition:  HOME W HOME HEALTH SERVICES  Per UR Regulation:    If discussed at Long Length of Stay Meetings, dates discussed:    Comments:  12/21/11 1443 Arlyss Queen, RN BSN CM Pt discharged home today with [facility]. Alroy Bailiff of [facility] is aware and will collect the pts information from the chart. Pt will go by [facility] store to pick up 3 N 1. Pt was able to secure a rolling walker from a family member. HH services will start within 24 hours. No other HH or CM needs noted. Pt and pts nurse aware of discharge arrangements.  12/20/11 1420 Arlyss Queen, RN BSN CM

## 2011-12-20 NOTE — Progress Notes (Addendum)
Physical Therapy Treatment Patient Details Name: Ian Aguilar MRN: [id number] DOB: 23-Apr-1940 Today's Date: 12/20/2011 Time: 0920-0953 PT Time Calculation (min): 33 min  PT Assessment / Plan / Recommendation Comments on Treatment Session  Pt tolerated total treatment well with increased distance with gait training and improved gait mechanics following vc-ing for heel to toe, equal stride length and posture.  Began stair training with min assistance required and vc-ing for correct sequencing with LE with SPC and one hand rail.  Pt was reviewed and able to verbalize hip precautions for anterior hip .  Continue with stair training for proper technique prior d/c to home.    Follow Up Recommendations        Does the patient have the potential to tolerate intense rehabilitation     Barriers to Discharge        Equipment Recommendations       Recommendations for Other Services    Frequency     Plan      Precautions / Restrictions Precautions Precautions: Anterior Hip Restrictions Weight Bearing Restrictions: Yes LLE Weight Bearing: Weight bearing as tolerated       Mobility  Transfers Transfers: Sit to Stand;Stand to Sit Sit to Stand: 5: Supervision;With upper extremity assist Stand to Sit: 5: Supervision;With upper extremity assist Ambulation/Gait Ambulation/Gait Assistance: 4: Min guard Ambulation Distance (Feet): 180 Feet Assistive device: Rolling walker Gait Pattern: Ataxic Gait velocity: WNL General Gait Details: antalgic L LE Stairs: Yes Stairs Assistance: 4: Min assist Stairs Assistance Details (indicate cue type and reason): cueing for proper ascending/descending "up with strong leg, down with weak leg" Stair Management Technique: One rail Left;With cane Number of Stairs: 6  Wheelchair Mobility Wheelchair Mobility: No    Exercises     PT Diagnosis:    PT Problem List:   PT Treatment Interventions:     PT Goals Acute Rehab PT Goals PT Goal: Supine/Side  to Sit - Progress: Met PT Goal: Ambulate - Progress: Progressing toward goal (antalgic gait ) PT Goal: Up/Down Stairs - Progress: Progressing toward goal Additional Goals PT Goal: Additional Goal #1 - Progress: Progressing toward goal  Visit Information  Last PT Received On: 12/20/11    Subjective Data  Subjective: Pt reported pain increase this morning around 5, took pain meds and pain reduced to 3-4/10   Cognition  Overall Cognitive Status: Appears within functional limits for tasks assessed/performed Arousal/Alertness: Awake/alert Orientation Level: Appears intact for tasks assessed Behavior During Session: [facility] for tasks performed    Balance     End of Session PT - End of Session Equipment Utilized During Treatment: Gait belt Activity Tolerance: Patient tolerated treatment well;Patient limited by fatigue Patient left: in chair;with call bell/phone within reach Nurse Communication: Mobility status   GP     Juel Burrow 12/20/2011, 9:59 AM

## 2011-12-20 NOTE — Addendum Note (Signed)
Addendum  created 12/20/11 0950 by Franco Nones, CRNA   Modules edited:Charges VN

## 2011-12-20 NOTE — Evaluation (Signed)
Occupational Therapy Evaluation Patient Details Name: Ian Aguilar MRN: [id number] DOB: [birth year] Today's Date: 12/20/2011 Time: 7829-5621 OT Time Calculation (min): 49 min  OT Assessment / Plan / Recommendation Clinical Impression  Patient is a 71 y/o male s/p L total hip presenting to acute OT with all education complete. Patient practiced transfer into tub/shower combo with tub transfer bench at supervision level with rolling walker. Recommend pt purchase tub bench. Pt stated that his brother-in-law had one when he was living and he's going to see if the family still has it. Provided pt with resources for purchase. Recommend HH OT at D/C to assess safety in home and during ADL performance since wife is unable to help due to health issues.    OT Assessment  All further OT needs can be met in the next venue of care    Follow Up Recommendations  Home health OT       Equipment Recommendations  Tub/shower bench          Precautions / Restrictions Precautions Precautions: Anterior Hip Restrictions Weight Bearing Restrictions: Yes LLE Weight Bearing: Weight bearing as tolerated   Pertinent Vitals/Pain 5/10 pain level in left hip. Ice pack applied.    ADL  Grooming: Performed;Wash/dry hands;Supervision/safety Where Assessed - Grooming: Unsupported standing Toilet Transfer: Research scientist (life sciences) Method: Surveyor, minerals: Materials engineer and Hygiene: Performed;Supervision/safety Where Assessed - Engineer, mining and Hygiene: Standing Tub/Shower Transfer: Engineer, manufacturing Method: Passenger transport manager: Transfer tub bench;Grab bars Equipment Used: Wheelchair;Rolling walker;Gait belt Transfers/Ambulation Related to ADLs: patient transfers at Supervision level with RW. ADL Comments: Pt practiced tub/shower combo transfer with transfer  tub bench. Recommend patient purchase bench for home use.       Visit Information  Last OT Received On: 12/20/11 Assistance Needed: +1    Subjective Data  Subjective: "Lavenia Atlas been told that I'm doing very well." Patient Stated Goal: To go home.   Prior Functioning     Home Living Lives With: Spouse Available Help at Discharge: Other (Comment) (pt states that wife if in poor health. Not able to help much) Type of Home: House Home Access: Level entry Home Layout: Multi-level Alternate Level Stairs-Number of Steps: 8 steps to bedroom with 2 rails available, 5 steps at kitchen with 1 rail  Bathroom Shower/Tub: Tub/shower unit (patient states he has two tub/showers) Firefighter: Standard Bathroom Accessibility: Yes How Accessible: Accessible via walker Home Adaptive Equipment: Wheelchair - manual;Hand-held shower hose Prior Function Level of Independence: Independent Able to Take Stairs?: Yes Driving: Yes Vocation: Retired Musician: No difficulties Dominant Hand: Right            Cognition  Overall Cognitive Status: Appears within functional limits for tasks assessed/performed Arousal/Alertness: Awake/alert Orientation Level: Appears intact for tasks assessed Behavior During Session: [facility] for tasks performed    Extremity/Trunk Assessment Right Upper Extremity Assessment RUE ROM/Strength/Tone: Within functional levels (MMT: 5/5) RUE Sensation: WFL - Light Touch;WFL - Proprioception RUE Coordination: WFL - gross/fine motor Left Upper Extremity Assessment LUE ROM/Strength/Tone: Within functional levels (MMT: 5/5) LUE Sensation: WFL - Light Touch;WFL - Proprioception LUE Coordination: WFL - gross/fine motor     Mobility Transfers Transfers: Sit to Stand;Stand to Sit Sit to Stand: 5: Supervision;From chair/3-in-1;With armrests Stand to Sit: 5: Supervision;To chair/3-in-1;With upper extremity assist              End of Session OT - End of  Session Equipment Utilized During Treatment:  Gait belt;Other (comment) (rolling walker; tub transfer bench) Activity Tolerance: Patient tolerated treatment well Patient left: in bed;with call bell/phone within reach    [facility], OTR/L 12/20/2011, 3:24 PM

## 2011-12-20 NOTE — Progress Notes (Signed)
CRITICAL VALUE ALERT  Critical value received:  HGB 6.8  Date of notification:  12/20/11  Time of notification:  0637  Critical value read back: YES  Nurse who received alert:  Avel Sensor RN   MD notified (1st page):  Romeo Apple   Time of first page:  [phone]  MD notified (2nd page):  Time of second page:  Responding MD:  Romeo Apple   Time MD responded:  [phone]

## 2011-12-20 NOTE — Progress Notes (Addendum)
Physical Therapy Treatment Patient Details Name: Ian Aguilar MRN: [id number] DOB: 01-19-1941 Today's Date: 12/20/2011 Time: 4098-1191 PT Time Calculation (min): 25 min  PT Assessment / Plan / Recommendation Comments on Treatment Session  Pt with improved gait mechanics, less cueing required ambulating with RW this session.  Continued using SPC with step training requiring min assistance and vc-ing for proper foot to ascend and descend steps.  Pt request gait training with SPC going back to room, pt with multiple LOB able to regain balance with min assistance before advancing other LE.  Recommend continuing gait training with RW until stronger.    Follow Up Recommendations        Does the patient have the potential to tolerate intense rehabilitation     Barriers to Discharge        Equipment Recommendations  Tub/shower bench    Recommendations for Other Services    Frequency     Plan      Precautions / Restrictions Precautions Precautions: Anterior Hip Restrictions Weight Bearing Restrictions: Yes LLE Weight Bearing: Weight bearing as tolerated    Mobility  Transfers Transfers: Sit to Stand;Stand to Sit Sit to Stand: 5: Supervision;From chair/3-in-1;With armrests Stand to Sit: 5: Supervision;To chair/3-in-1;With upper extremity assist Ambulation/Gait Ambulation/Gait Assistance: 4: Min guard;4: Min assist Ambulation Distance (Feet): 180 Feet (90' RW min guard, 90' SPC with min assistance for LOB) Assistive device: Rolling walker;Straight cane Gait Pattern: Ataxic Gait velocity: WNL General Gait Details: antalgic L LE increase LOB with SPC Stairs: Yes Stairs Assistance: 4: Min assist Stairs Assistance Details (indicate cue type and reason): cueing for proper ascending/descending "up with strong leg, down with weak leg Stair Management Technique: One rail Left;With cane    Exercises General Exercises - Lower Extremity Ankle Circles/Pumps: AROM;Strengthening;Both;10  reps;Seated Long Arc Quad: AROM;Strengthening;Both;10 reps;Seated Hip ABduction/ADduction: AROM;Strengthening;Both;10 reps;Seated;Other (comment) (isometrics)   PT Diagnosis:    PT Problem List:   PT Treatment Interventions:     PT Goals Acute Rehab PT Goals PT Goal: Supine/Side to Sit - Progress: Met PT Goal: Ambulate - Progress: Progressing toward goal (antalgic gait, recommend continuing gait with RW) PT Goal: Up/Down Stairs - Progress: Progressing toward goal Additional Goals PT Goal: Additional Goal #1 - Progress: Progressing toward goal  Visit Information  Last PT Received On: 12/20/11 Assistance Needed: +1    Subjective Data  Subjective: Pt reported pain free while sitting, pt excited but a little nervous to return home.    Cognition  Overall Cognitive Status: Appears within functional limits for tasks assessed/performed Arousal/Alertness: Awake/alert Orientation Level: Appears intact for tasks assessed Behavior During Session: [facility] for tasks performed    Balance     End of Session PT - End of Session Equipment Utilized During Treatment: Gait belt Activity Tolerance: Patient tolerated treatment well;Patient limited by fatigue Patient left: in chair;with call bell/phone within reach Nurse Communication: Mobility status   GP     Juel Burrow 12/20/2011, 4:45 PM

## 2011-12-21 DIAGNOSIS — D62 Acute posthemorrhagic anemia: Secondary | ICD-10-CM | POA: Diagnosis not present

## 2011-12-21 LAB — CBC
HCT: 24.9 % — ABNORMAL LOW (ref 39.0–52.0)
Hemoglobin: 8.7 g/dL — ABNORMAL LOW (ref 13.0–17.0)
MCH: 32.6 pg (ref 26.0–34.0)
MCHC: 34.9 g/dL (ref 30.0–36.0)
MCV: 93.3 fL (ref 78.0–100.0)
Platelets: 146 10*3/uL — ABNORMAL LOW (ref 150–400)
RBC: 2.67 MIL/uL — ABNORMAL LOW (ref 4.22–5.81)
RDW: 18.3 % — ABNORMAL HIGH (ref 11.5–15.5)
WBC: 4.1 10*3/uL (ref 4.0–10.5)

## 2011-12-21 LAB — TYPE AND SCREEN
ABO/RH(D): A NEG
Antibody Screen: NEGATIVE
Unit division: 0
Unit division: 0

## 2011-12-21 MED ORDER — OXYCODONE-ACETAMINOPHEN 5-325 MG PO TABS: 1.0000 | ORAL_TABLET | ORAL | Status: DC

## 2011-12-21 MED ORDER — ASPIRIN 325 MG PO TBEC: 325.0000 mg | DELAYED_RELEASE_TABLET | Freq: Two times a day (BID) | ORAL | Status: DC

## 2011-12-21 NOTE — Discharge Summary (Signed)
Physician Discharge Summary  Patient ID: Ian Aguilar MRN: [id number] DOB/AGE: 70-Aug-1942 70 y.o.  Admit date: 12/18/2011 Discharge date: 12/21/2011  Admission Diagnoses:  OA LEFT HIP   Discharge Diagnoses: OA LEFT HIP  Active Problems:  Acute blood loss anemia   Discharged Condition: good  Hospital Course: ADMITTED FOR THA LEFT HIP TOLERATED WELL WITH EBL 200CC. DID VERY WELL IN PT , WALKED 180 FEET. HGB 6.8 POST OP NO OBVIOUS BLEEDING. TRANSFUSED 2 UNITS AND D/C WITH HG OF 8.7. NO SYMPTOMS  LOVENOX HELD DUE TO LOW HGB AND  LIVER DISEASE Consults: None  Significant Diagnostic Studies: labs:  CBC    Component Value Date/Time   WBC 4.1 12/21/2011 0451   RBC 2.67* 12/21/2011 0451   HGB 8.7* 12/21/2011 0451   HCT 24.9* 12/21/2011 0451   PLT 146* 12/21/2011 0451   MCV 93.3 12/21/2011 0451   MCH 32.6 12/21/2011 0451   MCHC 34.9 12/21/2011 0451   RDW 18.3* 12/21/2011 0451   LYMPHSABS 1.4 12/13/2011 1420   MONOABS 0.4 12/13/2011 1420   EOSABS 0.1 12/13/2011 1420   BASOSABS 0.0 12/13/2011 1420      Treatments: surgery: LEFT THA DEPUY TRILOCK AND PF CUP  and TRANSFUSION FOR ACUTE BLOOD LOSS ANEMIA   Discharge Exam: Blood pressure 119/63, pulse 77, temperature 98 F (36.7 C), temperature source Oral, resp. rate 18, height 5\' 11"  (1.803 m), weight 162 lb (73.483 kg), SpO2 94.00%. General appearance: alert, cooperative and appears stated age Neurologic: Grossly normal Incision/Wound:CLEAN   Disposition: 01-Home or Self Care  Discharge Orders    Future Appointments: Provider: Department: Dept Phone: Center:   01/01/2012 1:30 PM Vickki Hearing, MD [facility] [phone] [facility]     Future Orders Please Complete By Expires   Diet - low sodium heart healthy      Call MD / Call 911      Comments:   If you experience chest pain or shortness of breath, CALL 911 and be transported to the hospital emergency room.  If you develope a fever above 101 F, pus (white  drainage) or increased drainage or redness at the wound, or calf pain, call your surgeon's office.   Constipation Prevention      Comments:   Drink plenty of fluids.  Prune juice may be helpful.  You may use a stool softener, such as Colace (over the counter) 100 mg twice a day.  Use MiraLax (over the counter) for constipation as needed.   Increase activity slowly as tolerated      TED hose      Comments:   Use stockings (TED hose) for 6 weeks on both leg(s).  You may remove them at night for sleeping.   Change dressing      Comments:   You may change your dressing on friday, then change the dressing daily with sterile 4 x 4 inch gauze dressing and paper tape.  You may clean the incision with alcohol prior to redressing   Follow the hip precautions as taught in Physical Therapy      Driving restrictions      Comments:   No driving for 2 weeks   Lifting restrictions      Comments:   No lifting for 12 weeks       Medication List     As of 12/21/2011  7:45 AM    STOP taking these medications         HYDROcodone-acetaminophen 5-325 MG per tablet  Commonly known as: NORCO/VICODIN      TAKE these medications         acetaminophen 500 MG tablet   Commonly known as: TYLENOL   Take 500-1,000 mg by mouth every 6 (six) hours as needed. For pain      ALPRAZolam 0.5 MG tablet   Commonly known as: XANAX   Take by mouth every 6 (six) hours as needed. For anxiety      aspirin 325 MG EC tablet   Take 1 tablet (325 mg total) by mouth 2 (two) times daily.      B-2 PO   Place 1 tablet under the tongue daily.      oxyCODONE-acetaminophen 5-325 MG per tablet   Commonly known as: PERCOCET/ROXICET   Take 1 tablet by mouth every 4 (four) hours.      thiamine 100 MG tablet   Take 100 mg by mouth daily.      tiotropium 18 MCG inhalation capsule   Commonly known as: SPIRIVA   Place 18 mcg into inhaler and inhale daily.           Follow-up Information    Follow up with [name], MD. Schedule an appointment as soon as possible for a visit on 01/01/2012.   Contact information:   [address], [place] [address] [phone]          Signed: [name] 12/21/2011, 7:45 AM

## 2011-12-21 NOTE — Progress Notes (Signed)
Patient given discharge instructions with no questions unanswered. Prescriptions given. Patient understood cleaning incision, hip precautions, s/s of infection and complications related to surgery, and when to call the doctor. Patient left with family in stable condition. Taken out of facility by staff via wheelchair.

## 2011-12-21 NOTE — Progress Notes (Signed)
Physical Therapy Treatment Patient Details Name: JALONI DAVOLI MRN: [id number] DOB: 1940/05/31 Today's Date: 12/21/2011 Time: 1478-2956 PT Time Calculation (min): 27 min 2 gt  PT Assessment / Plan / Recommendation Comments on Treatment Session  Patient did very well with RW. No LOB and completed 10 steps for stair training using RW to the side with one rail as well as backwards;Min A;no LOB    Follow Up Recommendations        Does the patient have the potential to tolerate intense rehabilitation     Barriers to Discharge        Equipment Recommendations       Recommendations for Other Services    Frequency     Plan      Precautions / Restrictions Restrictions Weight Bearing Restrictions: No LLE Weight Bearing: Weight bearing as tolerated   Pertinent Vitals/Pain     Mobility  Bed Mobility Bed Mobility: Not assessed Transfers Sit to Stand: 6: Modified independent (Device/Increase time) Stand to Sit: 4: Min guard Details for Transfer Assistance: Patient needing verbal cueing to back completely t surface before sitting Ambulation/Gait Ambulation/Gait Assistance: 5: Supervision Ambulation Distance (Feet): 195 Feet (with stair training as well) Gait Pattern: Antalgic Gait velocity: WNL General Gait Details: no LOB with RW Stairs Assistance: 4: Min assist Stairs Assistance Details (indicate cue type and reason): instructed patient with use of RW to the side and backwards with one rail  Stair Management Technique: One rail Left;Other (comment) (walker) Number of Stairs: 10     Exercises Other Exercises Other Exercises: 6' of dynamic standing balance activities   PT Diagnosis:    PT Problem List:   PT Treatment Interventions:     PT Goals    Visit Information  Last PT Received On: 12/21/11    Subjective Data      Cognition       Balance     End of Session PT - End of Session Equipment Utilized During Treatment: Gait belt Activity Tolerance: Patient  tolerated treatment well Patient left: in chair;with call bell/phone within reach;with chair alarm set;with nursing in room (nursing student present)   GP     Lula Olszewski, Adea Geisel ATKINSO 12/21/2011, 9:57 AM

## 2011-12-26 ENCOUNTER — Telehealth: Payer: Self-pay | Admitting: Orthopedic Surgery

## 2011-12-26 DIAGNOSIS — Z96649 Presence of unspecified artificial hip joint: Secondary | ICD-10-CM

## 2011-12-26 NOTE — Telephone Encounter (Signed)
Order has been sent to [facility] nurse, no option to leave message Called patient, left message

## 2011-12-26 NOTE — Telephone Encounter (Signed)
Call received from [facility] nurse who is with patient now; states "hospital sent him home without Ted hose"; states has swelling; patient had total hip replacement surgery 12/18/11. Her direct ph# is [phone], and main office local ph# is [phone].  Please advise.

## 2011-12-26 NOTE — Telephone Encounter (Signed)
Yes

## 2011-12-26 NOTE — Telephone Encounter (Signed)
i do see an order for TED HOSE but patient was sent home without them. Do I need to send order to [facility]?

## 2012-01-01 ENCOUNTER — Ambulatory Visit (INDEPENDENT_AMBULATORY_CARE_PROVIDER_SITE_OTHER): Payer: Medicare Other | Admitting: Orthopedic Surgery

## 2012-01-01 VITALS — Ht 71.0 in | Wt 161.0 lb

## 2012-01-01 DIAGNOSIS — Z96649 Presence of unspecified artificial hip joint: Secondary | ICD-10-CM

## 2012-01-01 MED ORDER — OXYCODONE-ACETAMINOPHEN 5-325 MG PO TABS: 1.0000 | ORAL_TABLET | ORAL | Status: DC

## 2012-01-01 NOTE — Progress Notes (Signed)
Patient ID: Ian Aguilar, male   DOB: [birth year], 71 y.o.   MRN: [id number] Chief Complaint  Patient presents with  . Follow-up    Post op #1, left THA. DOS 12-18-11.    Status post LEFT total hip arthroplasty 2 weeks ago. Staples were removed today. He does have what appears to be a seroma in the proximal aspect of the LEFT hip, but no drainage.  Recommend observation. Return in 2 weeks for recheck.  Start outpatient physical therapy. Continue weightbearing as tolerated. Okay to drive

## 2012-01-04 ENCOUNTER — Telehealth: Payer: Self-pay | Admitting: Orthopedic Surgery

## 2012-01-04 NOTE — Telephone Encounter (Signed)
Patient called regarding physical therapy, asking if he can "go back to having home therapy" Vs. Out-patient therapy at [facility], due to insurance co-pay?  He said his local insurance agent mentioned that the home therapy "was much less".   He was discharged from home visits yesterday 01/03/12, per [facility], and per office visit here.  I spoke with Victorino Dike, nurse at [facility], who states that his insurance allows for 5 home visits only.  Patient's home ph# is [phone] (Home).  Continue to follow up with out-patient therapy?

## 2012-01-05 NOTE — Telephone Encounter (Signed)
If insurance says he can have 5 visits he can have 5 visits, and of discussion nothing I can do thanks

## 2012-01-05 NOTE — Telephone Encounter (Signed)
Called back to patient, relayed. 

## 2012-01-09 NOTE — Telephone Encounter (Signed)
Ok enough of this:) :( If therapy released him to outpatient he has to have outpatient Ps: i dont need or want another note i am happy to answer in person

## 2012-01-10 ENCOUNTER — Ambulatory Visit (HOSPITAL_COMMUNITY)
Admission: RE | Admit: 2012-01-10 | Discharge: 2012-01-10 | Disposition: A | Discharge status: Home / Self Care | Payer: Medicare Other | Source: Ambulatory Visit | Attending: Orthopedic Surgery | Admitting: Orthopedic Surgery

## 2012-01-10 DIAGNOSIS — IMO0001 Reserved for inherently not codable concepts without codable children: Secondary | ICD-10-CM | POA: Insufficient documentation

## 2012-01-10 DIAGNOSIS — M25552 Pain in left hip: Secondary | ICD-10-CM | POA: Insufficient documentation

## 2012-01-10 DIAGNOSIS — R29898 Other symptoms and signs involving the musculoskeletal system: Secondary | ICD-10-CM | POA: Insufficient documentation

## 2012-01-10 DIAGNOSIS — R262 Difficulty in walking, not elsewhere classified: Secondary | ICD-10-CM | POA: Insufficient documentation

## 2012-01-10 DIAGNOSIS — Z96649 Presence of unspecified artificial hip joint: Secondary | ICD-10-CM | POA: Insufficient documentation

## 2012-01-10 NOTE — Evaluation (Signed)
Physical Therapy Evaluation  Patient Details  Name: Ian Aguilar MRN: [id number] Date of Birth: 1940-10-07  Today's Date: 01/10/2012 Time: 1304-1400 PT Time Calculation (min): 56 min  Visit#: 1  of 12   Re-eval: 02/09/12 Assessment Diagnosis: L THR Surgical Date: 12/18/11 Next MD Visit: 01/15/2012 Prior Therapy: HH  Authorization: blue medicare   Past Medical History:  Past Medical History  Diagnosis Date  . COPD (chronic obstructive pulmonary disease)   . ETOH abuse   . Cirrhosis     confirmed by MRI on 07/04/11.  no immunizations  . Hyperlipidemia   . Depression with anxiety   . Anxiety   . Arthritis    Past Surgical History:  Past Surgical History  Procedure Date  . Knee surgery     left-arthroscopy-[place]  . Skull fracture elevation     fractured cheeck bone repaired  . Broken arm 6 yrs ago    left otif of wrist-[place]  . External ear surgery     right ear-cleaned out ear and created new eardrum  . Wisdom tooth extraction   . Bilateral cataract surgery     [place]  . Colonoscopy 1996    [facility]: external hemorrhoids, no polyps  . Colonoscopy 06/28/2011    Dr. Claudius Sis diverticulosis,tubular adenoma, hyperplastic polyp  . Esophagogastroduodenoscopy 06/28/2011    Dr. Dellie Catholic erosive reflux esophagitis, hiatal hernia-gastritis  . Inguinal hernia repair 2-3 yrs ago    left-[facility]  . 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 states that he had a L THR on 12/18/2011.  He was discharged to home with home health on 12/21/11.   The patient has now been referred to outpatient physcial therapy to maximize his functional level.  [name] states that prior to the operation he was not using any assistive device.  The patient currently is walking with a walker for  safety.  He states that prior to the operation he was falling.  How long can you sit comfortably?: The patient states he is able to sit for an hour without getting up.  How long can you stand comfortably?: The patient is able to stand for thirty minutes. How long can you walk comfortably?: The longest the patient has walked has been about 15 minutes. Patient Stated Goals: Walking without an assistive device.  To be off pain medication.   Pain Assessment Currently in Pain?: Yes Pain Score:   1 Pain Location: Hip Pain Orientation: Left Pain Type: Surgical pain   Prior Functioning  Home Living Type of Home: House Home Access: Stairs to enter Entrance Stairs-Number of Steps: 2 Entrance Stairs-Rails: Right Home Layout: Multi-level Alternate Level Stairs-Number of Steps:  (7) Alternate Level Stairs-Rails: Right Bathroom Shower/Tub: Tub/shower unit (Pt has not stepped into tub at this point. Pt is sponge bath) Prior Function Driving: Yes Vocation: Retired Leisure: Hobbies-no  Cognition/Observation Observation/Other Assessments Observations: Pt has noted atrophy of L quadricep.  Sensation/Coordination/Flexibility/Functional Tests Functional Tests Functional Tests: ABC 82%  Assessment LLE Strength Left Hip Flexion: 5/5 Left Hip Extension: 3/5 Left Knee Flexion: 5/5 Left Knee Extension: 4/5 Left Ankle Dorsiflexion: 4/5 Left Ankle Plantar Flexion: 4/5  Exercise/Treatments Mobility/Balance  Static Standing Balance Single Leg Stance - Right Leg: 0  Single Leg Stance - Left Leg: 2    Standing Gait Training: with cane/steps Seated Long Arc Quad: Left;5 reps  Supine Quad Sets: 10 reps Bridges: 10 reps Other Supine Knee Exercises: DF/PF x 10    Physical Therapy Assessment and Plan PT Assessment and Plan Clinical Impression Statement: Pt with decreased balance, decreased strength and pain after L THR on 12/18/2011.  No active abduction for three more weeks.  Pt with benefit  from skilled  PT to improve pt functioning level and safety. Pt will benefit from skilled therapeutic intervention in order to improve on the following deficits: Decreased activity tolerance;Decreased knowledge of use of DME;Decreased strength;Difficulty walking;Pain Rehab Potential: Excellent PT Frequency: Min 3X/week PT Duration: 4 weeks PT Treatment/Interventions: Gait training;Stair training;Therapeutic exercise;Balance training PT Plan: Pt to begin gt training with treadmill for proper stride length; gt training with cane for safety including stairs as pt  has a three level house.; heel raise, minisquats, lateral step up, SLS, progress to rockerboard R/L; terminal extension, wall squats.    Goals Home Exercise Program Pt will Perform Home Exercise Program: Independently PT Short Term Goals Time to Complete Short Term Goals: 2 weeks PT Short Term Goal 1: Pt to be using cane in and outside of house PT Short Term Goal 2: Pt SLS to improve to at lease 6 PT Short Term Goal 3: mm strength to be improved 1/2 grade. PT Short Term Goal 4: No falls PT Long Term Goals Time to Complete Long Term Goals: 4 weeks PT Long Term Goal 1: Pt to have no pain PT Long Term Goal 2: Pt to be comfortable ambulating without a cane inside the house and for short distances outside the house; ie to mailbox Long Term Goal 3: I in [facility] increasing LE strength one grade Long Term Goal 4: normalized gt PT Long Term Goal 5: SLS to be to 10 seconds to improve balance- Pt to have had no falls  Problem List Patient Active Problem List  Diagnosis  . JOINT EFFUSION, KNEE  . KNEE PAIN  . Hepatomegaly  . Encounter for screening colonoscopy  . OA (osteoarthritis) of hip  . Cirrhosis  . Umbilical hernia  . Acute blood loss anemia  . Weakness of left leg  . Difficulty in walking  . Pain in left hip    PT - End of Session Equipment Utilized During Treatment: Gait belt Activity Tolerance: Patient tolerated  treatment well General Behavior During Session: [facility] for tasks performed Cognition: [facility] for tasks performed PT Plan of Care PT Home Exercise Plan: given Consulted and Agree with Plan of Care: Patient  GP Functional Assessment Tool Used: ABC and clinical judgement Functional Limitation: Mobility: Walking and moving around Mobility: Walking and Moving Around Current Status ([id number]): At least 20 percent but less than 40 percent impaired, limited or restricted Mobility: Walking and Moving Around Goal Status [phone]): At least 1 percent but less than 20 percent impaired, limited or restricted  Gregoire Bennis,CINDY 01/10/2012, 2:19 PM  Physician Documentation Your signature is required to indicate approval of the treatment plan as stated above.  Please sign and either send electronically or make a copy of this report for your files and return this physician signed original.   Please mark one 1.__approve of plan  2. ___approve of plan with the following conditions.   ______________________________  _____________________ Physician Signature                                                                                                             Date

## 2012-01-10 NOTE — Telephone Encounter (Signed)
This was done and handled, unsure of why [facility] re-routed the note. Done, patient aware, on 01/05/12 as noted.

## 2012-01-12 ENCOUNTER — Ambulatory Visit (HOSPITAL_COMMUNITY)
Admission: RE | Admit: 2012-01-12 | Discharge: 2012-01-12 | Disposition: A | Discharge status: Home / Self Care | Payer: Medicare Other | Source: Ambulatory Visit | Attending: Orthopedic Surgery | Admitting: Orthopedic Surgery

## 2012-01-12 DIAGNOSIS — R29898 Other symptoms and signs involving the musculoskeletal system: Secondary | ICD-10-CM

## 2012-01-12 DIAGNOSIS — R262 Difficulty in walking, not elsewhere classified: Secondary | ICD-10-CM

## 2012-01-12 DIAGNOSIS — M25552 Pain in left hip: Secondary | ICD-10-CM

## 2012-01-12 NOTE — Progress Notes (Signed)
Physical Therapy Treatment Patient Details  Name: Ian Aguilar MRN: [id number] Date of Birth: 05-06-1940  Today's Date: 01/12/2012 Time: 1191-4782 PT Time Calculation (min): 45 min  Visit#: 2  of 12   Re-eval: 02/09/12  Charge: gait 23', therex 22'  Authorization: [employer]   Subjective: Symptoms/Limitations Symptoms: Pt stated L hip min pain maybe 2 and 1/2 out of 10.  Pt ambulated with RW into dept, brought his personal [facility] with him for session.   Pain Assessment Currently in Pain?: Yes Pain Score:   3 Pain Location: Hip Pain Orientation: Left  Objective:   Exercise/Treatments Standing Heel Raises: 15 reps;Limitations Heel Raises Limitations: toe raises 15x Lateral Step Up: Left;10 reps;Hand Hold: 1;Step Height: 4" Forward Step Up: Left;10 reps;Hand Hold: 1 Functional Squat: 15 reps;3 seconds;Limitations Functional Squat Limitations: minisquats Rocker Board: 2 minutes (R/L) SLS: 2x 30" with finger assistance; L 4" max of 3 Gait Training: gait training with SPC for sequencing in hospital hallway to main lobby; meet to pattern stair training with SPC and 1HR/ 1RT; 23 min total    Physical Therapy Assessment and Plan PT Assessment and Plan Clinical Impression Statement: Began PT treatment for L hip gait and strengthening.  Pt required mod cueing for proper sequencing with SPC initially but able to demonstration correct technique and self cueing with improved gait mechanics at end of session.  Began stair training with noted visible quad fatigue.  Pt able to perform proper form with therex following demonstration and verbal cueing but limited by fatigue with new activities. PT Plan: Next session begin gait training on TM with focus on proper stride length.  Add terminal extension and progress to wall squats when ready.    Goals    Problem List Patient Active Problem List  Diagnosis  . JOINT EFFUSION, KNEE  . KNEE PAIN  . Hepatomegaly  . Encounter for screening  colonoscopy  . OA (osteoarthritis) of hip  . Cirrhosis  . Umbilical hernia  . Acute blood loss anemia  . Weakness of left leg  . Difficulty in walking  . Pain in left hip    PT - End of Session Equipment Utilized During Treatment: Gait belt Activity Tolerance: Patient tolerated treatment well General Behavior During Session: [facility] for tasks performed Cognition: [facility] for tasks performed  GP    Juel Burrow 01/12/2012, 5:01 PM

## 2012-01-15 ENCOUNTER — Encounter: Payer: Self-pay | Admitting: Orthopedic Surgery

## 2012-01-15 ENCOUNTER — Ambulatory Visit (INDEPENDENT_AMBULATORY_CARE_PROVIDER_SITE_OTHER): Payer: Medicare Other | Admitting: Orthopedic Surgery

## 2012-01-15 ENCOUNTER — Ambulatory Visit (HOSPITAL_COMMUNITY)
Admission: RE | Admit: 2012-01-15 | Discharge: 2012-01-15 | Disposition: A | Discharge status: Home / Self Care | Payer: Medicare Other | Source: Ambulatory Visit | Attending: Orthopedic Surgery | Admitting: Orthopedic Surgery

## 2012-01-15 VITALS — Ht 71.0 in | Wt 161.0 lb

## 2012-01-15 DIAGNOSIS — M25552 Pain in left hip: Secondary | ICD-10-CM

## 2012-01-15 DIAGNOSIS — R29898 Other symptoms and signs involving the musculoskeletal system: Secondary | ICD-10-CM

## 2012-01-15 DIAGNOSIS — Z96649 Presence of unspecified artificial hip joint: Secondary | ICD-10-CM

## 2012-01-15 DIAGNOSIS — R262 Difficulty in walking, not elsewhere classified: Secondary | ICD-10-CM

## 2012-01-15 MED ORDER — OXYCODONE-ACETAMINOPHEN 5-325 MG PO TABS: 1.0000 | ORAL_TABLET | ORAL | Status: DC

## 2012-01-15 NOTE — Patient Instructions (Signed)
2 more weeks of therapy

## 2012-01-15 NOTE — Progress Notes (Signed)
Patient ID: Ian Aguilar, male   DOB: 23-Sep-1940, 71 y.o.   MRN: [id number] Chief Complaint  Patient presents with  . Follow-up    2 week recheck on left hip wound.; 10.21.13, LEFT total hip arthroplasty    4 week followup visit in 2 week followup visit from last visit to recheck the wound.  His wound has healed nicely. There is no surrounding, hematoma or fluid collection.  He like a refill on his pain medication, which is Percocet.  He will continue 2 weeks in therapy. Exam Ambulation with a cane. His gait pattern is normalizing.  He did stop his aspirin secondary to nosebleeding  Return in 2 weeks for his 6 week postop visit status post LEFT total hip arthroplasty

## 2012-01-15 NOTE — Progress Notes (Signed)
Physical Therapy Treatment Patient Details  Name: Ian Aguilar MRN: [id number] Date of Birth: 04/08/1940  Today's Date: 01/15/2012 Time: 1430-1516 PT Time Calculation (min): 46 min  Visit#: 3  of 12   Re-eval: 02/09/12  Charge: gait 8', therex 38'  Authorization: [employer]  Authorization Time Period:    Authorization Visit#: 3  of 10    Subjective: Symptoms/Limitations Symptoms: Pt stated he was sore following last session, min pain maybe 1& 02/29/08 today.  Pt expressed more confidence ambulating with SPC, stated he walked into dept, he went shopping and into dr office with [facility]. Pain Assessment Currently in Pain?: Yes Pain Score:   2 Pain Location: Hip Pain Orientation: Left  Objective:   Exercise/Treatments Aerobic Tread Mill: Gait training .50 cyc/sec 8' Standing Heel Raises: 20 reps;Limitations Heel Raises Limitations: toe raises 20x Terminal Knee Extension: Left;10 reps;Theraband Theraband Level (Terminal Knee Extension): Level 4 (Blue) Lateral Step Up: Left;15 reps;Hand Hold: 2;Step Height: 4" Forward Step Up: Left;15 reps;Hand Hold: 1;Step Height: 4" Functional Squat: 15 reps;3 seconds;Limitations Functional Squat Limitations: minisquats Rocker Board: 2 minutes SLS: L 9", R 15" Other Standing Knee Exercises: 5 STS with L back no HHA  Physical Therapy Assessment and Plan PT Assessment and Plan Clinical Impression Statement: Began gait training on TM for improved gait mechanics with cueing for posture and equal stride length.  Added step down for eccentric quad strengthening with noted visible fatigue.  Pt limited by fatigue with total session, no compliants of increased pain just decreased activity tolerance. PT Plan: Continue with current POC for L LE strengthening, balance and improving gait mechanics.    Goals    Problem List Patient Active Problem List  Diagnosis  . JOINT EFFUSION, KNEE  . KNEE PAIN  . Hepatomegaly  . Encounter for screening  colonoscopy  . OA (osteoarthritis) of hip  . Cirrhosis  . Umbilical hernia  . Acute blood loss anemia  . Weakness of left leg  . Difficulty in walking  . Pain in left hip  . S/P hip replacement    PT - End of Session Equipment Utilized During Treatment: Gait belt Activity Tolerance: Patient tolerated treatment well General Behavior During Session: [facility] for tasks performed Cognition: [facility] for tasks performed  GP    Juel Burrow 01/15/2012, 4:22 PM

## 2012-01-17 ENCOUNTER — Ambulatory Visit (HOSPITAL_COMMUNITY)
Admission: RE | Admit: 2012-01-17 | Discharge: 2012-01-17 | Disposition: A | Discharge status: Home / Self Care | Payer: Medicare Other | Source: Ambulatory Visit | Attending: Orthopedic Surgery | Admitting: Orthopedic Surgery

## 2012-01-17 DIAGNOSIS — M25552 Pain in left hip: Secondary | ICD-10-CM

## 2012-01-17 DIAGNOSIS — R262 Difficulty in walking, not elsewhere classified: Secondary | ICD-10-CM

## 2012-01-17 DIAGNOSIS — R29898 Other symptoms and signs involving the musculoskeletal system: Secondary | ICD-10-CM

## 2012-01-17 NOTE — Progress Notes (Signed)
Physical Therapy Treatment Patient Details  Name: Ian Aguilar MRN: [id number] Date of Birth: April 05, 1940  Today's Date: 01/17/2012 Time: 1300-1345 PT Time Calculation (min): 45 min  Visit#: 4  of 12   Re-eval: 02/09/12 Assessment Diagnosis: L THR Surgical Date: 12/18/11 Charge: Gait 12', therex 32'  Authorization: [employer]  Authorization Time Period:    Authorization Visit#: 4  of 10    Subjective: Symptoms/Limitations Symptoms: Pt stated increased soreness L hip today pain scale 2/10.  Pt reported improved confidence while ambulatng with SPC and stated he was able to SLS 14" at home yesterday. Pain Assessment Currently in Pain?: Yes Pain Score:   2 Pain Location: Hip Pain Orientation: Left  Objective:   Exercise/Treatments Aerobic Tread Mill: 1.5 mph x 8' Standing Lateral Step Up: Left;15 reps;Hand Hold: 2;Step Height: 4" Forward Step Up: Left;15 reps;Hand Hold: 1;Step Height: 4" Functional Squat: 15 reps;3 seconds;Limitations Functional Squat Limitations: minisquats Rocker Board: 2 minutes SLS: L 15", R 20" max of 3 Gait Training: gait training with SPC for sequencing to improve confidence 4 RT prior TM Other Standing Knee Exercises: 5 STS with L back no HHA Other Standing Knee Exercises: tandem stance 2x 30" each foot forward Seated Other Seated Knee Exercises: heel roll out 10x 10"  Physical Therapy Assessment and Plan PT Assessment and Plan Clinical Impression Statement: Added heel roll out to improve hip mobility with improved gait mechanics following. Pt demonstrated improved gait mechanics, able to increase speed on TM with good posture and equal stride lengths and stance phases. Improved technique with stair training though continues to be limited by fatigue PT Plan: Continue with current POC for L LE strengthening, balance and improving gait mechanics.    Goals    Problem List Patient Active Problem List  Diagnosis  . JOINT EFFUSION, KNEE  .  KNEE PAIN  . Hepatomegaly  . Encounter for screening colonoscopy  . OA (osteoarthritis) of hip  . Cirrhosis  . Umbilical hernia  . Acute blood loss anemia  . Weakness of left leg  . Difficulty in walking  . Pain in left hip  . S/P hip replacement    PT - End of Session Equipment Utilized During Treatment: Gait belt Activity Tolerance: Patient tolerated treatment well General Behavior During Session: [facility] for tasks performed Cognition: [facility] for tasks performed  GP    Ian Aguilar 01/17/2012, 6:30 PM

## 2012-01-19 ENCOUNTER — Ambulatory Visit (HOSPITAL_COMMUNITY)
Admission: RE | Admit: 2012-01-19 | Discharge: 2012-01-19 | Disposition: A | Discharge status: Home / Self Care | Payer: Medicare Other | Source: Ambulatory Visit | Attending: Orthopedic Surgery | Admitting: Orthopedic Surgery

## 2012-01-19 DIAGNOSIS — M25552 Pain in left hip: Secondary | ICD-10-CM

## 2012-01-19 DIAGNOSIS — R262 Difficulty in walking, not elsewhere classified: Secondary | ICD-10-CM

## 2012-01-19 DIAGNOSIS — R29898 Other symptoms and signs involving the musculoskeletal system: Secondary | ICD-10-CM

## 2012-01-19 NOTE — Progress Notes (Signed)
Physical Therapy Treatment Patient Details  Name: JIOVANNI SILMON MRN: [id number] Date of Birth: [birth year]  Today's Date: 01/19/2012 Time: 1300-1344 PT Time Calculation (min): 44 min  Visit#: 5  of 12   Re-eval: 02/09/12    Authorization: [employer]  Authorization Time Period:    Authorization Visit#: 5  of 10    Subjective: Symptoms/Limitations Symptoms: Pt states that he is doing his exercises at home. States stair climbing is still difficult for him   Pain Assessment Currently in Pain?: No/denies      Exercise/Treatments        Aerobic Tread Mill: 1.5 mph x 8'   Standing Heel Raises: 10 reps;Limitations Heel Raises Limitations: to squat  Lateral Step Up: Left;10 reps;Step Height: 6" Forward Step Up: Left;10 reps;Hand Hold: 1;Step Height: 6" Functional Squat: 15 reps;3 seconds;Limitations Functional Squat Limitations: minisquats Rocker Board: 2 minutes SLS: L 8", R 8" max of 3 Gait Training: steps  1 RT Other Standing Knee Exercises: 5 STS with L back no HHA (5 x 2 STS) Other Standing Knee Exercises: tandem stance 2x1' each foot forward Seated Other Seated Knee Exercises: heel roll out 10x 10"    Physical Therapy Assessment and Plan PT Assessment and Plan Clinical Impression Statement: Pt continues to have balance issues; Strength and gait are returning to normal PT Frequency: Min 3X/week PT Duration: 4 weeks PT Plan: begin wall squats     Goals Home Exercise Program PT Goal: Perform Home Exercise Program - Progress: Met PT Short Term Goals PT Short Term Goal 1 - Progress: Progressing toward goal (using cane inside 20% no AD 80%; outside using walker more) PT Short Term Goal 2 - Progress: Met PT Short Term Goal 3 - Progress: Progressing toward goal PT Short Term Goal 4 - Progress: Met PT Long Term Goals PT Long Term Goal 1 - Progress: Progressing toward goal PT Long Term Goal 2 - Progress: Progressing toward goal Long Term Goal 3 Progress:  Met Long Term Goal 4 Progress: Progressing toward goal Long Term Goal 5 Progress: Progressing toward goal  Problem List Patient Active Problem List  Diagnosis  . JOINT EFFUSION, KNEE  . KNEE PAIN  . Hepatomegaly  . Encounter for screening colonoscopy  . OA (osteoarthritis) of hip  . Cirrhosis  . Umbilical hernia  . Acute blood loss anemia  . Weakness of left leg  . Difficulty in walking  . Pain in left hip  . S/P hip replacement    PT - End of Session Equipment Utilized During Treatment: Gait belt Activity Tolerance: Patient tolerated treatment well General Behavior During Session: [facility] for tasks performed Cognition: [facility] for tasks performed PT Plan of Care PT Home Exercise Plan: given  GP    Theora Vankirk,CINDY 01/19/2012, 1:41 PM

## 2012-01-22 ENCOUNTER — Ambulatory Visit (HOSPITAL_COMMUNITY)
Admission: RE | Admit: 2012-01-22 | Discharge: 2012-01-22 | Disposition: A | Discharge status: Home / Self Care | Payer: Medicare Other | Source: Ambulatory Visit | Attending: Orthopedic Surgery | Admitting: Orthopedic Surgery

## 2012-01-22 NOTE — Progress Notes (Signed)
Physical Therapy Treatment Patient Details  Name: Ian Aguilar MRN: [id number] Date of Birth: 1941-02-20  Today's Date: 01/22/2012 Time: 4098-1191 PT Time Calculation (min): 41 min  Visit#: 6  of 12   Re-eval: 02/09/12 Charges: Gait x 8' NMR x 10' Therex x 20'  Authorization: [employer]  Authorization Visit#: 6  of 10    Subjective: Symptoms/Limitations Symptoms: Pt states that he is doing well. He has been practicing walking outside without an AD. Pain Assessment Currently in Pain?: Yes Pain Score:   1 Pain Location: Hip Pain Orientation: Left   Exercise/Treatments Aerobic Tread Mill: 1.5-1.8 mph x 8' w/ cueing for heel/toe and posture Standing Heel Raises: 10 reps;Limitations Heel Raises Limitations: to squat  Lateral Step Up: 15 reps;Step Height: 6";Left;Hand Hold: 2 Forward Step Up: 15 reps;Left;Hand Hold: 1;Step Height: 6" Wall Squat: 10 reps;5 seconds Rocker Board: 2 minutes SLS: L 9", R 5" max of 3 Gait Training: Tandem gait 1/2 RT Other Standing Knee Exercises: 5 STS with L back no HHA Other Standing Knee Exercises: tandem stance 1' each foot forward  Physical Therapy Assessment and Plan PT Assessment and Plan Clinical Impression Statement: Pt continues to have most difficulty with balance activities. Pt is unable to complete tandem gait with true tandem stand. Pt has multiple LOB requiring moderate assistance to recover. Pt reports increased hip mm soreness at end of session (2/10). PT Plan: Continue to progress strength and stability per PT POC.     Problem List Patient Active Problem List  Diagnosis  . JOINT EFFUSION, KNEE  . KNEE PAIN  . Hepatomegaly  . Encounter for screening colonoscopy  . OA (osteoarthritis) of hip  . Cirrhosis  . Umbilical hernia  . Acute blood loss anemia  . Weakness of left leg  . Difficulty in walking  . Pain in left hip  . S/P hip replacement    PT - End of Session Equipment Utilized During Treatment: Gait  belt Activity Tolerance: Patient tolerated treatment well General Behavior During Session: [facility] for tasks performed Cognition: [facility] for tasks performed  Seth Bake, PTA 01/22/2012, 2:11 PM

## 2012-01-24 ENCOUNTER — Ambulatory Visit (HOSPITAL_COMMUNITY): Payer: Medicare Other | Admitting: *Deleted

## 2012-01-30 ENCOUNTER — Encounter: Payer: Self-pay | Admitting: Orthopedic Surgery

## 2012-01-30 ENCOUNTER — Ambulatory Visit (INDEPENDENT_AMBULATORY_CARE_PROVIDER_SITE_OTHER): Payer: Medicare Other | Admitting: Orthopedic Surgery

## 2012-01-30 ENCOUNTER — Other Ambulatory Visit: Payer: Self-pay

## 2012-01-30 VITALS — Ht 71.0 in | Wt 161.0 lb

## 2012-01-30 DIAGNOSIS — K7460 Unspecified cirrhosis of liver: Secondary | ICD-10-CM

## 2012-01-30 DIAGNOSIS — Z96649 Presence of unspecified artificial hip joint: Secondary | ICD-10-CM

## 2012-01-30 MED ORDER — OXYCODONE-ACETAMINOPHEN 5-325 MG PO TABS: 1.0000 | ORAL_TABLET | ORAL | Status: DC

## 2012-01-30 NOTE — Progress Notes (Signed)
Patient ID: Ian Aguilar, male   DOB: [birth year], 71 y.o.   MRN: [id number] Chief Complaint  Patient presents with  . Follow-up    2 week recheck on left hip. DOS 12-18-11.    Current Outpatient Prescriptions on File Prior to Visit  Medication Sig Dispense Refill  . acetaminophen (TYLENOL) 500 MG tablet Take 500-1,000 mg by mouth every 6 (six) hours as needed. For pain      . ALPRAZolam (XANAX) 0.5 MG tablet Take by mouth every 6 (six) hours as needed. For anxiety      . aspirin 325 MG EC tablet Take 1 tablet (325 mg total) by mouth 2 (two) times daily.  90 tablet  0  . oxyCODONE-acetaminophen (PERCOCET/ROXICET) 5-325 MG per tablet Take 1 tablet by mouth every 4 (four) hours.  84 tablet  0  . Riboflavin (B-2 PO) Place 1 tablet under the tongue daily.      Marland Kitchen thiamine 100 MG tablet Take 100 mg by mouth daily.      Marland Kitchen tiotropium (SPIRIVA) 18 MCG inhalation capsule Place 18 mcg into inhaler and inhale daily.      Marland Kitchen HYDROcodone-acetaminophen (NORCO/VICODIN) 5-325 MG per tablet         Left total hip arthroplasty six-week followup visit patient reportedly doing well no issues at this time. He has fairly normal leg lengths measured in the supine position  He should continue his therapy exercises and followup with [place] for his 12 week visit in 6 weeks

## 2012-01-30 NOTE — Patient Instructions (Addendum)
Exercises as in therapy

## 2012-02-02 ENCOUNTER — Ambulatory Visit (HOSPITAL_COMMUNITY)
Admission: RE | Admit: 2012-02-02 | Discharge: 2012-02-02 | Disposition: A | Discharge status: Home / Self Care | Payer: Medicare Other | Source: Ambulatory Visit | Attending: Orthopedic Surgery | Admitting: Orthopedic Surgery

## 2012-02-02 DIAGNOSIS — M25552 Pain in left hip: Secondary | ICD-10-CM

## 2012-02-02 DIAGNOSIS — IMO0001 Reserved for inherently not codable concepts without codable children: Secondary | ICD-10-CM | POA: Insufficient documentation

## 2012-02-02 DIAGNOSIS — R262 Difficulty in walking, not elsewhere classified: Secondary | ICD-10-CM | POA: Insufficient documentation

## 2012-02-02 DIAGNOSIS — Z96649 Presence of unspecified artificial hip joint: Secondary | ICD-10-CM | POA: Insufficient documentation

## 2012-02-02 DIAGNOSIS — R29898 Other symptoms and signs involving the musculoskeletal system: Secondary | ICD-10-CM

## 2012-02-02 NOTE — Progress Notes (Signed)
Physical Therapy Treatment Patient Details  Name: Ian Aguilar MRN: [id number] Date of Birth: 1940/11/11  Today's Date: 02/02/2012 Time: 1306-1350 PT Time Calculation (min): 44 min  Visit#: 7  of 12   Re-eval: 02/09/12    Authorization: [employer]      Authorization Visit#: 7  of 10    Subjective: Symptoms/Limitations Symptoms: Pt states he feels therapy has helped immensley.  Pt    Exercise/Treatments   Aerobic Tread Mill: gt trainer x 8:00'   Standing Heel Raises: 10 reps;Limitations Heel Raises Limitations: L only Lateral Step Up: Left;15 reps;Hand Hold: 0;Step Height: 6" Forward Step Up: Left;15 reps;Hand Hold: 0;Step Height: 6" Wall Squat: 10 reps;5 seconds Rocker Board: 2 minutes SLS: L 20",  Gait Training: Tandem gait 1/2 RT Other Standing Knee Exercises: 10 STS with L back no HHA Other Standing Knee Exercises: tandem stance 1' each foot forward    Physical Therapy Assessment and Plan PT Assessment and Plan Clinical Impression Statement: Pt states with difficulty with balance as well as sit to stand but overall continues to improve.  Attempted tandem gt on balance beam but pt was unable to complete this task. PT Plan: continue with balance and strengthening. Work on heel toe gt in dept without AD    Goals Home Exercise Program PT Goal: Perform Home Exercise Program - Progress: Met PT Short Term Goals PT Short Term Goal 1 - Progress: Met PT Short Term Goal 2 - Progress: Met PT Short Term Goal 3 - Progress: Met PT Short Term Goal 4 - Progress: Met PT Long Term Goals PT Long Term Goal 1 - Progress: Progressing toward goal PT Long Term Goal 2 - Progress: Met Long Term Goal 3 Progress: Met Long Term Goal 4 Progress: Progressing toward goal Long Term Goal 5 Progress: Met  Problem List Patient Active Problem List  Diagnosis  . JOINT EFFUSION, KNEE  . KNEE PAIN  . Hepatomegaly  . Encounter for screening colonoscopy  . OA (osteoarthritis) of hip  .  Cirrhosis  . Umbilical hernia  . Acute blood loss anemia  . Weakness of left leg  . Difficulty in walking  . Pain in left hip  . S/P hip replacement       GP    Laurette Villescas,CINDY 02/02/2012, 2:01 PM

## 2012-02-07 ENCOUNTER — Ambulatory Visit (HOSPITAL_COMMUNITY)
Admission: RE | Admit: 2012-02-07 | Discharge: 2012-02-07 | Disposition: A | Discharge status: Home / Self Care | Payer: Medicare Other | Source: Ambulatory Visit | Attending: Orthopedic Surgery | Admitting: Orthopedic Surgery

## 2012-02-07 DIAGNOSIS — R29898 Other symptoms and signs involving the musculoskeletal system: Secondary | ICD-10-CM

## 2012-02-07 DIAGNOSIS — R262 Difficulty in walking, not elsewhere classified: Secondary | ICD-10-CM

## 2012-02-07 DIAGNOSIS — M25552 Pain in left hip: Secondary | ICD-10-CM

## 2012-02-07 NOTE — Progress Notes (Signed)
Physical Therapy Treatment Patient Details  Name: NOLTON DENIS MRN: [id number] Date of Birth: [birth year]  Today's Date: 02/07/2012 Time: 1300-1345 PT Time Calculation (min): 45 min  Visit#: 8  of 12   Re-eval: 02/09/12  Charge: Therex 30', Gait 15'  Authorization: [employer]  Authorization Time Period:    Authorization Visit#: 8  of 10    Subjective: Symptoms/Limitations Symptoms: Pt states he feels PT is helping alot, pain free with improved functional tasks.  Pt stated most difficult task to complete at home is sit to stand from desk chair. Pain Assessment Currently in Pain?: No/denies  Objective:   Exercise/Treatments Standing Lateral Step Up: Left;15 reps;Hand Hold: 0;Step Height: 6" Forward Step Up: Left;15 reps;Hand Hold: 0;Step Height: 6" Wall Squat: 10 reps;10 seconds Rocker Board: 3 minutes;Limitations Rocker Board Limitations: R/L no HHA SLS: L 25", R 35" max of 3 Gait Training: Gait training through hospital no AD x 1500'/ reciprocal stair training 2 RT with 1 HR assistance Other Standing Knee Exercises: 10 STS with L back no HHA Other Standing Knee Exercises: tandem stance 1' each foot forward 3 reps; tandem gait 1/2 RT Seated Other Seated Knee Exercises: heel roll out 10x 10"    Physical Therapy Assessment and Plan PT Assessment and Plan Clinical Impression Statement: Resumed heel out seated exercise for hip IR strengthening for improved gait mechanics.  Pt balance improving but still required assistance with SLS and tandem stance with cueing to improve weight distribution PT Plan: continue with balance and strengthening. Work on heel toe gt in dept without AD    Goals    Problem List Patient Active Problem List  Diagnosis  . JOINT EFFUSION, KNEE  . KNEE PAIN  . Hepatomegaly  . Encounter for screening colonoscopy  . OA (osteoarthritis) of hip  . Cirrhosis  . Umbilical hernia  . Acute blood loss anemia  . Weakness of left leg  . Difficulty  in walking  . Pain in left hip  . S/P hip replacement    PT - End of Session Equipment Utilized During Treatment: Gait belt Activity Tolerance: Patient tolerated treatment well General Behavior During Session: [facility] for tasks performed Cognition: [facility] for tasks performed  GP    Juel Burrow 02/07/2012, 2:30 PM

## 2012-02-09 ENCOUNTER — Ambulatory Visit (HOSPITAL_COMMUNITY)
Admission: RE | Admit: 2012-02-09 | Discharge: 2012-02-09 | Disposition: A | Discharge status: Home / Self Care | Payer: Medicare Other | Source: Ambulatory Visit | Attending: Orthopedic Surgery | Admitting: Orthopedic Surgery

## 2012-02-09 ENCOUNTER — Encounter: Payer: Self-pay | Admitting: *Deleted

## 2012-02-09 DIAGNOSIS — R262 Difficulty in walking, not elsewhere classified: Secondary | ICD-10-CM

## 2012-02-09 DIAGNOSIS — R29898 Other symptoms and signs involving the musculoskeletal system: Secondary | ICD-10-CM

## 2012-02-09 DIAGNOSIS — M25552 Pain in left hip: Secondary | ICD-10-CM

## 2012-02-09 NOTE — Progress Notes (Signed)
Physical Therapy Treatment Patient Details  Name: Ian Aguilar MRN: [id number] Date of Birth: March 22, 1940  Today's Date: 02/09/2012 Time: 6440-3474 PT Time Calculation (min): 45 min  Visit#: 9  of 12   Re-eval: 02/09/12  Charge: Gait 15', NMR 30'  Authorization: [employer]  Authorization Time Period:    Authorization Visit#: 9  of 10    Subjective: Symptoms/Limitations Symptoms: Pt stated he is really sore today.  Reported completeing more tasks around the house with increased ease.  Hardest task continues to be sit to stand and balance activities.  Pain scale minor 1/10. Pain Assessment Currently in Pain?: Yes Pain Score:   1 Pain Location: Hip Pain Orientation: Left  Objective:   Exercise/Treatments Standing Rocker Board: 3 minutes;Limitations Rocker Board Limitations: R/L no HHA Gait Training: Gait training through hospital no AD x 1500'/ reciprocal stair training 3 RT with 1 HR assistance x 15 min Other Standing Knee Exercises: 10 STS with L back no HHA Other Standing Knee Exercises: tandem stance 30" holds 1RT    Physical Therapy Assessment and Plan PT Assessment and Plan Clinical Impression Statement: Pt improving gait mechanics but does continue to require cueing to equalize stance phase for more normalized gait.  Session focus on functional strengthening and balance.  Pt with most difficulty with static tandem stance with weight on L LE, requires min assistance for LOB episodes. PT Plan: Continue with gait training, functional strengthening and balance.    Goals    Problem List Patient Active Problem List  Diagnosis  . JOINT EFFUSION, KNEE  . KNEE PAIN  . Hepatomegaly  . Encounter for screening colonoscopy  . OA (osteoarthritis) of hip  . Cirrhosis  . Umbilical hernia  . Acute blood loss anemia  . Weakness of left leg  . Difficulty in walking  . Pain in left hip  . S/P hip replacement    PT - End of Session Equipment Utilized During  Treatment: Gait belt Activity Tolerance: Patient tolerated treatment well General Behavior During Session: [facility] for tasks performed Cognition: [facility] for tasks performed  GP    Juel Burrow 02/09/2012, 4:03 PM

## 2012-02-12 ENCOUNTER — Telehealth: Payer: Self-pay | Admitting: Internal Medicine

## 2012-02-12 ENCOUNTER — Ambulatory Visit (HOSPITAL_COMMUNITY)
Admission: RE | Admit: 2012-02-12 | Discharge: 2012-02-12 | Disposition: A | Discharge status: Home / Self Care | Payer: Medicare Other | Source: Ambulatory Visit | Attending: Orthopedic Surgery | Admitting: Orthopedic Surgery

## 2012-02-12 ENCOUNTER — Encounter: Payer: Self-pay | Admitting: Internal Medicine

## 2012-02-12 DIAGNOSIS — M25552 Pain in left hip: Secondary | ICD-10-CM

## 2012-02-12 DIAGNOSIS — R262 Difficulty in walking, not elsewhere classified: Secondary | ICD-10-CM

## 2012-02-12 DIAGNOSIS — R29898 Other symptoms and signs involving the musculoskeletal system: Secondary | ICD-10-CM

## 2012-02-12 NOTE — Telephone Encounter (Signed)
Message copied by Glendora Score on Mon Feb 12, 2012  9:01 AM ------      Message from: Gwen Her      Created: Fri Feb 09, 2012  8:47 AM       This is on the Jan recall, thanks.            AFP AND US LIVER U/S EVERY 6 MONTHS/OV IN 6 MONTHS/with extender

## 2012-02-12 NOTE — Telephone Encounter (Signed)
I mailed patient a letter to contact me to schedule  

## 2012-02-12 NOTE — Evaluation (Signed)
Physical Therapy Reassessment Patient Details  Name: Ian Aguilar MRN: [id number] Date of Birth: September 16, 1940  Today's Date: 02/12/2012 Time: 4098-1191 PT Time Calculation (min): 43 min  Visit#: 10  of 12   Re-eval:   Assessment Diagnosis: L THR Surgical Date: 12/18/11  Authorization: [facility] Medicare  Authorization Time Period:    Authorization Visit#: 10  of 12    Past Medical History:  Past Medical History  Diagnosis Date  . COPD (chronic obstructive pulmonary disease)   . ETOH abuse   . Cirrhosis     confirmed by MRI on 07/04/11.  no immunizations  . Hyperlipidemia   . Depression with anxiety   . Anxiety   . Arthritis    Past Surgical History:  Past Surgical History  Procedure Date  . Knee surgery     left-arthroscopy-[place]  . Skull fracture elevation     fractured cheeck bone repaired  . Broken arm 6 yrs ago    left otif of wrist-[place]  . External ear surgery     right ear-cleaned out ear and created new eardrum  . Wisdom tooth extraction   . Bilateral cataract surgery     [place]  . Colonoscopy 1996    [place]: external hemorrhoids, no polyps  . Colonoscopy 06/28/2011    Dr. Claudius Sis diverticulosis,tubular adenoma, hyperplastic polyp  . Esophagogastroduodenoscopy 06/28/2011    Dr. Dellie Catholic erosive reflux esophagitis, hiatal hernia-gastritis  . Inguinal hernia repair 2-3 yrs ago    left-[place]-[facility]_  . 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 How long can you sit comfortably?: Able to sit for two hours now was one. How long can you stand comfortably?: Able to stand for 30 minutes. How long can you walk comfortably?: The patient has not walked for long period of time but this is due to not completing the task not the  inability Patient Stated Goals: Pt states that he has been able to do more now than he has been able to in 2-3 years. Pain Assessment Currently in Pain?: Yes Pain Score: 0-No pain (1-2 at the most a nagging dull ache) Pain Location: Hip Pain Orientation: Left Pain Type: Chronic pain  Sensation/Coordination/Flexibility/Functional Tests Functional Tests Functional Tests: ABC  Assessment LLE Strength Left Hip Flexion: 5/5 Left Hip Extension: 3+/5 (was 3/5) Left Hip ABduction: 3+/5 (was not tested at eval secondary to not being 6 weeks) Left Knee Flexion: 5/5 Left Knee Extension: 5/5 (was 4/5) Left Ankle Dorsiflexion: 4/5 (was 4/5 but Right is 4/5 as well) Left Ankle Plantar Flexion: 5/5 (was 4/5)  Exercise/Treatments Mobility/Balance  Static Standing Balance Single Leg Stance - Right Leg: 7  Single Leg Stance - Left Leg: 10      Standing Heel Raises: 10 reps Heel Raises Limitations: L only SLS: R 10; L 10 Other Standing Knee Exercises: tandem stance Physical Therapy Assessment and Plan PT Assessment and Plan Clinical Impression Statement: Pt continues to need strengthening for hip abduction and extension.  PT also has limitations in balance but feels after this week he will be able to do advance HEP at home and will eventually overcome these deficets. PT Plan: see two more treatments then D/C.  Work on hip extension/ abduction strength and balance.  Encourage pt to complete a walking program at home.  Goals Home Exercise  Program PT Goal: Perform Home Exercise Program - Progress: Met PT Short Term Goals PT Short Term Goal 1 - Progress: Met PT Short Term Goal 2 - Progress: Met PT Short Term Goal 3 - Progress: Met PT Short Term Goal 4 - Progress: Met PT Long Term Goals PT Long Term Goal 1 - Progress: Progressing toward goal PT Long Term Goal 2 - Progress: Met Long Term Goal 3 Progress: Met Long Term Goal 4 Progress: Met Long Term Goal 5 Progress: Met  Problem  List Patient Active Problem List  Diagnosis  . JOINT EFFUSION, KNEE  . KNEE PAIN  . Hepatomegaly  . Encounter for screening colonoscopy  . OA (osteoarthritis) of hip  . Cirrhosis  . Umbilical hernia  . Acute blood loss anemia  . Weakness of left leg  . Difficulty in walking  . Pain in left hip  . S/P hip replacement       GP Functional Assessment Tool Used: ABC and judgement Functional Limitation: Mobility: Walking and moving around Mobility: Walking and Moving Around Current Status ([id number]): At least 1 percent but less than 20 percent impaired, limited or restricted Mobility: Walking and Moving Around Goal Status [phone]): At least 1 percent but less than 20 percent impaired, limited or restricted  [name],CINDY 02/12/2012, 3:26 PM  Physician Documentation Your signature is required to indicate approval of the treatment plan as stated above.  Please sign and either send electronically or make a copy of this report for your files and return this physician signed original.   Please mark one 1.__approve of plan  2. ___approve of plan with the following conditions.   ______________________________                                                          _____________________ Physician Signature                                                                                                             Date

## 2012-02-14 ENCOUNTER — Ambulatory Visit (HOSPITAL_COMMUNITY)
Admission: RE | Admit: 2012-02-14 | Discharge: 2012-02-14 | Disposition: A | Discharge status: Home / Self Care | Payer: Medicare Other | Source: Ambulatory Visit | Attending: Orthopedic Surgery | Admitting: Orthopedic Surgery

## 2012-02-14 NOTE — Progress Notes (Signed)
Physical Therapy Treatment Patient Details  Name: Ian Aguilar MRN: [id number] Date of Birth: 1940/08/08  Today's Date: 02/14/2012 Time: 6962-9528 PT Time Calculation (min): 41 min  Visit#: 11  of 12   Charges: NMR x 25' Therex x 15'  Authorization: [employer]  Authorization Visit#: 11  of 12    Subjective: Symptoms/Limitations Symptoms:  Pt believes that he strained his L hip doing SL abduction last night.  Pain Assessment Currently in Pain?: Yes Pain Score:   2 Pain Location: Hip Pain Orientation: Left   Exercise/Treatments Standing Heel Raises: 10 reps Heel Raises Limitations: L only Rocker Board: 2 minutes Rocker Board Limitations: R/L no HHA SLS: R 13; L 16 Other Standing Knee Exercises: Side step 1 RT Other Standing Knee Exercises: Tandem gait x 1TR Supine Bridges: 15 reps Sidelying Hip ABduction: 10 reps Prone  Hip Extension: 10 reps  Physical Therapy Assessment and Plan PT Assessment and Plan Clinical Impression Statement: Pt continues to have most difficulty with balance activities. Pt displays improve control with rocker board but require moderate assistance to maintain balance with tandem gait. Pt educated on proper technique with SL abduction. Pt was initially attempted to L led too high and externally rotation. Pt reports decreased soreness at end of session. PT Plan: Continue x 1 more visist then D/C per PT.     Problem List Patient Active Problem List  Diagnosis  . JOINT EFFUSION, KNEE  . KNEE PAIN  . Hepatomegaly  . Encounter for screening colonoscopy  . OA (osteoarthritis) of hip  . Cirrhosis  . Umbilical hernia  . Acute blood loss anemia  . Weakness of left leg  . Difficulty in walking  . Pain in left hip  . S/P hip replacement    PT - End of Session Activity Tolerance: Patient tolerated treatment well General Behavior During Session: [facility] for tasks performed Cognition: [facility] for tasks performed  GP Functional Assessment Tool  Used: ABC and judgement  Seth Bake, PTA 02/14/2012, 1:44 PM

## 2012-02-16 ENCOUNTER — Ambulatory Visit (HOSPITAL_COMMUNITY)
Admission: RE | Admit: 2012-02-16 | Discharge: 2012-02-16 | Disposition: A | Discharge status: Home / Self Care | Payer: Medicare Other | Source: Ambulatory Visit | Attending: Orthopedic Surgery | Admitting: Orthopedic Surgery

## 2012-02-16 DIAGNOSIS — R262 Difficulty in walking, not elsewhere classified: Secondary | ICD-10-CM

## 2012-02-16 DIAGNOSIS — M25552 Pain in left hip: Secondary | ICD-10-CM

## 2012-02-16 DIAGNOSIS — R29898 Other symptoms and signs involving the musculoskeletal system: Secondary | ICD-10-CM

## 2012-02-16 NOTE — Progress Notes (Signed)
Physical Therapy Treatment Patient Details  Name: Ian Aguilar MRN: [id number] Date of Birth: September 22, 1940  Today's Date: 02/16/2012 Time: 1300-1349 PT Time Calculation (min): 49 min  Visit#: 12  of 12   Re-eval: 02/16/12    Authorization:    Authorization Time Period:    Authorization Visit#: 12  of 12    Subjective: Symptoms/Limitations Symptoms: Able to do all activities at home.  Most the time he is painfree.   Exercise/Treatments  Balance Exercises Standing SLS with Vectors: Solid surface;3 reps;10 secs Balance Beam: tandem gt x 2 RT  Retro Gait: 1 rep Sidestepping: 2 reps;Theraband Numbers 1-15: Foam;1 rep Other Standing Exercises: tandem stance x 30sec x 3  Other Standing Exercises: gt training x 8 min     Seated Other Seated Exercises: sit to stand x 10      Physical Therapy Assessment and Plan PT Assessment and Plan Clinical Impression Statement: Todays treatment focused on balance.  Pt encouraged to begin walking at least 5 times a week 20-30 minutes. PT Plan: Discharge as all goals are met    Goals Home Exercise Program PT Goal: Perform Home Exercise Program - Progress: Met PT Short Term Goals PT Short Term Goal 1 - Progress: Met PT Short Term Goal 2 - Progress: Met PT Short Term Goal 3 - Progress: Met PT Short Term Goal 4 - Progress: Met PT Long Term Goals PT Long Term Goal 1 - Progress: Partly met PT Long Term Goal 2 - Progress: Met Long Term Goal 3 Progress: Met Long Term Goal 4 Progress: Met Long Term Goal 5 Progress: Met  Problem List Patient Active Problem List  Diagnosis  . JOINT EFFUSION, KNEE  . KNEE PAIN  . Hepatomegaly  . Encounter for screening colonoscopy  . OA (osteoarthritis) of hip  . Cirrhosis  . Umbilical hernia  . Acute blood loss anemia  . Weakness of left leg  . Difficulty in walking  . Pain in left hip  . S/P hip replacement    PT - End of Session Equipment Utilized During Treatment: Gait belt Activity  Tolerance: Patient tolerated treatment well General Behavior During Session: [facility] for tasks performed Cognition: [facility] for tasks performed  GP Functional Limitation: Mobility: Walking and moving around Mobility: Walking and Moving Around Goal Status [phone]): At least 1 percent but less than 20 percent impaired, limited or restricted Mobility: Walking and Moving Around Discharge Status [phone]): At least 1 percent but less than 20 percent impaired, limited or restricted  Claus Silvestro,CINDY 02/16/2012, 1:46 PM

## 2012-03-12 ENCOUNTER — Ambulatory Visit (INDEPENDENT_AMBULATORY_CARE_PROVIDER_SITE_OTHER): Payer: Medicare Other | Admitting: Orthopedic Surgery

## 2012-03-12 ENCOUNTER — Encounter: Payer: Self-pay | Admitting: Orthopedic Surgery

## 2012-03-12 VITALS — BP 150/60 | Ht 71.0 in | Wt 161.0 lb

## 2012-03-12 DIAGNOSIS — Z96649 Presence of unspecified artificial hip joint: Secondary | ICD-10-CM

## 2012-03-12 NOTE — Progress Notes (Signed)
Patient ID: Ian Aguilar, male   DOB: 1940/07/14, 72 y.o.   MRN: [id number] Chief Complaint  Patient presents with  . Follow-up    12 weeek post op left THA     The patient has a left total hip is doing well he has a slight limp. Incision looks clean hip flexion is normal leg lengths are equal. Is not walking with any assistive devices  Advised to continue hip exercises followup in 3 months to check hip strength

## 2012-03-12 NOTE — Patient Instructions (Addendum)
Activities as tolerated Continue hip exercises

## 2012-06-18 ENCOUNTER — Encounter: Payer: Self-pay | Admitting: Orthopedic Surgery

## 2012-06-18 ENCOUNTER — Ambulatory Visit (INDEPENDENT_AMBULATORY_CARE_PROVIDER_SITE_OTHER): Payer: Medicare Other | Admitting: Orthopedic Surgery

## 2012-06-18 VITALS — BP 150/60 | Ht 71.0 in | Wt 161.0 lb

## 2012-06-18 DIAGNOSIS — Z96649 Presence of unspecified artificial hip joint: Secondary | ICD-10-CM

## 2012-06-18 NOTE — Patient Instructions (Signed)
activities as tolerated 

## 2012-06-18 NOTE — Progress Notes (Signed)
Patient ID: Ian Aguilar, male   DOB: 1940/04/13, 72 y.o.   MRN: [id number] Chief Complaint  Patient presents with  . Follow-up    3 month recheck left hip abduction/12/18/2011 THA     BP 150/60  Ht 5\' 11"  (1.803 m)  Wt 161 lb (73.029 kg)  BMI 22.46 kg/m2  Left total hip routine followup patient says he is not have any issues other than some soreness which goes away once he starts moving around  He is ambulatory with no assistive devices only requiring one ibuprofen in the morning  Review of systems negative  Examination.vs General appearance is normal, the patient is alert and oriented x3 with normal mood and affect. Ambulation is without assistive device no detectable limp  Leg lengths are equal. Excellent range of motion hip is stable motor exam is normal distal pulses are intact  Status post left total hip doing well  X-ray left hip 6 months

## 2012-07-23 ENCOUNTER — Telehealth: Payer: Self-pay | Admitting: General Practice

## 2012-07-23 ENCOUNTER — Encounter: Payer: Self-pay | Admitting: Internal Medicine

## 2012-07-23 NOTE — Telephone Encounter (Signed)
Letter has been mailed.

## 2012-07-23 NOTE — Telephone Encounter (Signed)
ONE YEAR REPEAT MRI

## 2012-10-02 ENCOUNTER — Other Ambulatory Visit: Payer: Self-pay

## 2012-12-19 ENCOUNTER — Ambulatory Visit (INDEPENDENT_AMBULATORY_CARE_PROVIDER_SITE_OTHER): Payer: Medicare Other | Admitting: Orthopedic Surgery

## 2012-12-19 ENCOUNTER — Ambulatory Visit (INDEPENDENT_AMBULATORY_CARE_PROVIDER_SITE_OTHER): Payer: Medicare Other

## 2012-12-19 ENCOUNTER — Encounter: Payer: Self-pay | Admitting: Orthopedic Surgery

## 2012-12-19 VITALS — BP 151/87 | Ht 71.0 in | Wt 158.0 lb

## 2012-12-19 DIAGNOSIS — Z96649 Presence of unspecified artificial hip joint: Secondary | ICD-10-CM

## 2012-12-19 DIAGNOSIS — Z96642 Presence of left artificial hip joint: Secondary | ICD-10-CM

## 2012-12-19 NOTE — Progress Notes (Signed)
Patient ID: Ian Aguilar, male   DOB: 1940-11-17, 72 y.o.   MRN: [id number]  Chief Complaint  Patient presents with  . Follow-up    6 month recheck on left hip replacement. DOS 12-18-11.    History the patient had a hip replacement is doing well he has no complaints  His review of systems reveals that he has balance issues unrelated to his hip replacement  Is functioning well otherwise and is happy with his hip  He had a [place] hip wedge stem press-fit cup  Blood pressure 151/87, height 5\' 11"  (1.803 m), weight 158 lb (71.668 kg). General appearance is normal, the patient is alert and oriented x3 with normal mood and affect.  Ambulation without limp range of motion is excellent as well hip is stable  X-ray show stable prosthesis without loosening  Impression status post left total hip plan return in a year for repeat x-ray

## 2013-01-02 ENCOUNTER — Other Ambulatory Visit: Payer: Self-pay

## 2013-02-11 ENCOUNTER — Ambulatory Visit (HOSPITAL_COMMUNITY)
Admission: RE | Admit: 2013-02-11 | Discharge: 2013-02-11 | Disposition: A | Discharge status: Home / Self Care | Payer: Medicare Other | Source: Ambulatory Visit | Attending: Otolaryngology | Admitting: Otolaryngology

## 2013-02-11 DIAGNOSIS — M6281 Muscle weakness (generalized): Secondary | ICD-10-CM | POA: Insufficient documentation

## 2013-02-11 DIAGNOSIS — R262 Difficulty in walking, not elsewhere classified: Secondary | ICD-10-CM | POA: Insufficient documentation

## 2013-02-11 DIAGNOSIS — IMO0001 Reserved for inherently not codable concepts without codable children: Secondary | ICD-10-CM | POA: Insufficient documentation

## 2013-02-11 DIAGNOSIS — M25569 Pain in unspecified knee: Secondary | ICD-10-CM | POA: Insufficient documentation

## 2013-02-11 DIAGNOSIS — M25559 Pain in unspecified hip: Secondary | ICD-10-CM | POA: Insufficient documentation

## 2013-02-11 NOTE — Evaluation (Signed)
Physical Therapy Evaluation  Patient Details  Name: Ian Aguilar MRN: [id number] Date of Birth: 17-Apr-1940  Today's Date: 02/11/2013 Time: 6213-0865 PT Time Calculation (min): 43 min Charges:  1 evaluation             Visit#: 1 of 10  Re-eval: 03/13/13 Assessment Diagnosis: abnormal gait Next MD Visit: Dr. Andrey Campanile - unscheduled Prior Therapy: hip replacement - last year  Past Medical History:  Past Medical History  Diagnosis Date  . COPD (chronic obstructive pulmonary disease)   . ETOH abuse   . Cirrhosis     confirmed by MRI on 07/04/11.  no immunizations  . Hyperlipidemia   . Depression with anxiety   . Anxiety   . Arthritis    Past Surgical History:  Past Surgical History  Procedure Laterality Date  . Knee surgery      left-arthroscopy-[place]  . Skull fracture elevation      fractured cheeck bone repaired  . Broken arm  6 yrs ago    left otif of wrist-[place]  . External ear surgery      right ear-cleaned out ear and created new eardrum  . Wisdom tooth extraction    . Bilateral cataract surgery      [place]  . Colonoscopy  1996    [place]: external hemorrhoids, no polyps  . Colonoscopy  06/28/2011    Dr. Claudius Sis diverticulosis,tubular adenoma, hyperplastic polyp  . Esophagogastroduodenoscopy  06/28/2011    Dr. Dellie Catholic erosive reflux esophagitis, hiatal hernia-gastritis  . Inguinal hernia repair  2-3 yrs ago    left-[place]-[facility]_  . Total hip arthroplasty  12/18/2011    Procedure: TOTAL HIP ARTHROPLASTY;  Surgeon: Vickki Hearing, MD;  Location: [facility];  Service: Orthopedics;  Laterality: Left;    Subjective Symptoms/Limitations Symptoms: Pt is a 72 year old male referred to PT for balance disorder which he reports that happened on Oct 28th when he fell down a slope in his yard while taking the dog out.  he states that his hands went numb immediatly, he crawled to the house (due to fear of not having the balance).  he reports that his right eye  has been going blurry for a few weeks before that.  He had had perfect vision after cataract surgery.  He plans on getting glasses to help with his vision.   Patient Stated Goals: walking independently Pain Assessment Currently in Pain?: No/denies  Balance Screening Balance Screen Has the patient fallen in the past 6 months: Yes How many times?: 3 Has the patient had a decrease in activity level because of a fear of falling? : Yes Is the patient reluctant to leave their home because of a fear of falling? 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   Sensation/Coordination/Flexibility/Functional Tests Functional Tests Functional Tests: FOTO: 66/34  RLE Strength RLE Overall Strength Comments: taken in seated position Right Hip Flexion: 5/5 Right Hip Extension: 4/5 Right Hip ABduction: 4/5 Right Hip ADduction: 3+/5 Right Knee Flexion: 5/5 Right Knee Extension: 5/5 Right Ankle Dorsiflexion: 3+/5 Right Ankle Plantar Flexion: 4/5 Right Ankle Inversion: 3+/5 Right Ankle Eversion: 3+/5 LLE Strength LLE Overall Strength Comments: taken in seated position Left Hip Flexion: 4/5 Left Hip Extension: 3+/5 Left Hip ABduction: 4/5 Left Hip ADduction: 3+/5 Left Knee Flexion: 5/5 Left  Knee Extension: 5/5 Left Ankle Dorsiflexion: 3+/5 Left Ankle Plantar Flexion: 4/5 Left Ankle Inversion: 3+/5 Left Ankle Eversion: 3+/5  Mobility/Balance  Ambulation/Gait Ambulation/Gait: Yes Assistive device: Straight cane Gait Pattern: Ataxic Gait velocity: 4.48 ft/sec Static Standing Balance Single Leg Stance - Right Leg: 0 Single Leg Stance - Left Leg: 0 Tandem Stance - Right Leg: 0 Tandem Stance - Left Leg: 0 Rhomberg - Eyes Opened: 30 Rhomberg - Eyes Closed:  30 Berg Balance Test Sit to Stand: Able to stand  independently using hands Standing Unsupported: Able to stand 2 minutes with supervision Sitting with Back Unsupported but Feet Supported on Floor or Stool: Able to sit safely and securely 2 minutes Stand to Sit: Controls descent by using hands Transfers: Able to transfer safely, definite need of hands Standing Unsupported with Eyes Closed: Able to stand 10 seconds with supervision Standing Ubsupported with Feet Together: Able to place feet together independently and stand for 1 minute with supervision From Standing, Reach Forward with Outstretched Arm: Can reach forward >5 cm safely (2") From Standing Position, Pick up Object from Floor: Unable to pick up shoe, but reaches 2-5 cm (1-2") from shoe and balances independently From Standing Position, Turn to Look Behind Over each Shoulder: Turn sideways only but maintains balance Turn 360 Degrees: Needs close supervision or verbal cueing Standing Unsupported, Alternately Place Feet on Step/Stool: Able to complete >2 steps/needs minimal assist Standing Unsupported, One Foot in Front: Needs help to step but can hold 15 seconds Standing on One Leg: Unable to try or needs assist to prevent fall Total Score: 31 Timed Up and Go Test TUG: Normal TUG Normal TUG (seconds): 14 (w/SPC)   Physical Therapy Assessment and Plan PT Assessment and Plan Clinical Impression Statement: Pt is a 72 year old male referred to PT for abnormal gait with impairments listed below.  At this time pt demonstrates central nervous system impairments (ataxic gait and movements) more than periphreal balance impairments. Explained difference between central vs. periphreal nervous system and balance disorders.  Pt will benefit from skilled therapeutic intervention in order to improve on the following deficits: Abnormal gait;Decreased coordination;Difficulty walking;Decreased strength Rehab Potential: Fair PT Frequency: Min  3X/week PT Duration: 8 weeks PT Treatment/Interventions: Gait training;Stair training;DME instruction;Functional mobility training;Therapeutic activities;Therapeutic exercise;Balance training;Neuromuscular re-education;Patient/family education PT Plan: [place] Balance program.  COntinue to progress low level balance activities with narrow BOS, on foam, with marching on foam, cone rotatition. gait exercises.     Goals Home Exercise Program Pt/caregiver will Perform Home Exercise Program: For improved balance PT Goal: Perform Home Exercise Program - Progress: Goal set today PT Short Term Goals Time to Complete Short Term Goals: 4 weeks PT Short Term Goal 1: Pt will improve his BLE ankle strength by 1 muscle grade for improve hip and ankle strategy to assist with dynamic balance.  PT Short Term Goal 2: Pt will improve proprioceptive awareness and demonstrate standing balane with wide BOS w/eyes closed x30 seconds with supervision.  PT Short Term Goal 3: Pt will improve his berg balance score to greater than 40 to decrease risk of falls.  PT Long Term Goals Time to Complete Long Term Goals: 8 weeks PT Long Term Goal 1: Pt will improve his BLE strength to [facility] in order to tolerate standing and walking outdoors with supervision.  PT Long Term Goal 2: Pt will improve his dynamic balance in order to ambulate independently indoors and outdoors with supervison.  Long Term Goal 3: Pt will improve his berg to  50/56 to decrease risk of falls. Long Term Goal 4: Pt will improve his TUG to 13 seconds without AD to improve gait speed in the community.  PT Long Term Goal 5: Pt will improve his FOTO to limitation less than 24% for improved QOL.   Problem List Patient Active Problem List   Diagnosis Date Noted  . 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 Weakness of left leg 01/10/2012  . Difficulty in walking(719.7) 01/10/2012  . Pain in left hip 01/10/2012  . Acute blood loss anemia 12/21/2011  . Cirrhosis  09/08/2011  . Umbilical hernia 09/08/2011  . 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  . Hepatomegaly 06/14/2011  . Encounter for screening colonoscopy 06/14/2011  . JOINT EFFUSION, KNEE 02/10/2008  . KNEE PAIN 02/10/2008    PT - End of Session Equipment Utilized During Treatment: Gait belt Activity Tolerance: Patient tolerated treatment well PT Plan of Care PT Home Exercise Plan: given PT Patient Instructions: central vs. periphreal balance disorders Consulted and Agree with Plan of Care: Patient  GP Functional Assessment Tool Used: FOTO: 66/34 Functional Limitation: Mobility: Walking and moving around Mobility: Walking and Moving Around Current Status ([id number]): At least 20 percent but less than 40 percent impaired, limited or restricted Mobility: Walking and Moving Around Goal Status [phone]): At least 20 percent but less than 40 percent impaired, limited or restricted  Vanesa Renier, MPT, ATC 02/11/2013, 4:06 PM  Physician Documentation Your signature is required to indicate approval of the treatment plan as stated above.  Please sign and either send electronically or make a copy of this report for your files and return this physician signed original.   Please mark one 1.__approve of plan  2. ___approve of plan with the following conditions.   ______________________________                                                          _____________________ Physician Signature                                                                                                             Date

## 2013-02-13 ENCOUNTER — Ambulatory Visit (HOSPITAL_COMMUNITY)
Admission: RE | Admit: 2013-02-13 | Discharge: 2013-02-13 | Disposition: A | Discharge status: Home / Self Care | Payer: Medicare Other | Source: Ambulatory Visit | Attending: Family Medicine | Admitting: Family Medicine

## 2013-02-13 NOTE — Progress Notes (Signed)
Physical Therapy Treatment Patient Details  Name: Ian Aguilar MRN: [id number] Date of Birth: [birth year]  Today's Date: 02/13/2013 Time: 1304-1350 PT Time Calculation (min): 46 min Charges: NMR: 7829-5621 TE: 1315-1350 Visit#: 2 of 10  Re-eval: 03/13/13 Assessment Diagnosis: abnormal gait Next MD Visit: Dr. Andrey Campanile - unscheduled  Authorization: Medicare  Authorization Time Period:    Authorization Visit#: 2 of 10   Subjective: Symptoms/Limitations Symptoms: Pt reports that he has tried some of the exercises at home, except for the step exercise.  States he is feeling a little ill today. Plans to pick up his new glasses today.  Pain Assessment Currently in Pain?: No/denies  Precautions/Restrictions     Exercise/Treatments Standing Heel Raises: 15 reps;Limitations Heel Raises Limitations: w/o HHA Knee Flexion: Both;15 reps Functional Squat: 15 reps;3 seconds (PT faiclation) Ankle Exercises - Seated Towel Crunch: 1 rep;Limitations Towel Crunch Limitations: BLE, 6 minutes Marble Pickup: 1 rep 10 marbles BLE (LLE: 5 minutes, RLE: ) Toe Raise: 15 reps;5 seconds Ankle Exercises - Supine T-Band: Red BLE x10 reps each  Balance Exercises Standing Standing Eyes Opened: Narrow base of support (BOS);Solid surface;1 rep;20 secs;Foam;30 secs Tandem Stance: 3 reps;30 secs;Eyes open;Limitations Tandem Stance Limitations: partial BLE w/min guard Standing, One Foot on a Step: 4 inch;1 rep;30 secs;Limitations Standing, One Foot on a Step Limitations: BLE w/min guard   Physical Therapy Assessment and Plan PT Assessment and Plan Clinical Impression Statement: Reviewed HEP to include LE strenghtening and balance activities.  Demonstrates most difficulty with coordinated movements and has difficulty with proprioception w/RLE with toe movements  and placing marble into the box.   Rehab Potential: Fair PT Frequency: Min 3X/week PT Duration: 8 weeks PT Treatment/Interventions:  Gait training;Stair training;DME instruction;Functional mobility training;Therapeutic activities;Therapeutic exercise;Balance training;Neuromuscular re-education;Patient/family education PT Plan: narrow BOS on foam, with head turns, cone rotation.  Advance HEP for ankle exercises to include therband and towel inversion and eversion    Goals    Problem List Patient Active Problem List   Diagnosis Date Noted  . 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 Weakness of left leg 01/10/2012  . Difficulty in walking(719.7) 01/10/2012  . Pain in left hip 01/10/2012  . Acute blood loss anemia 12/21/2011  . Cirrhosis 09/08/2011  . Umbilical hernia 09/08/2011  . 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  . Hepatomegaly 06/14/2011  . Encounter for screening colonoscopy 06/14/2011  . JOINT EFFUSION, KNEE 02/10/2008  . KNEE PAIN 02/10/2008    PT - End of Session Equipment Utilized During Treatment: Gait belt Activity Tolerance: Patient tolerated treatment well PT Plan of Care PT Home Exercise Plan: given PT Patient Instructions: central vs. periphreal balance disorders Consulted and Agree with Plan of Care: Patient  GP Functional Assessment Tool Used: FOTO: 66/34  Angelea Penny, MPT, ATC 02/13/2013, 1:52 PM

## 2013-02-18 ENCOUNTER — Ambulatory Visit (HOSPITAL_COMMUNITY)
Admission: RE | Admit: 2013-02-18 | Discharge: 2013-02-18 | Disposition: A | Discharge status: Home / Self Care | Payer: Medicare Other | Source: Ambulatory Visit | Attending: Family Medicine | Admitting: Family Medicine

## 2013-02-18 NOTE — Progress Notes (Signed)
Physical Therapy Treatment Patient Details  Name: Ian Aguilar MRN: [id number] Date of Birth: [birth year]  Today's Date: 02/18/2013 Time: 8295-6213 PT Time Calculation (min): 48 min Charge: TE 1340-1403, NMR 0865-7846  Visit#: 3 of 10  Re-eval: 03/13/13 Assessment Diagnosis: abnormal gait Next MD Visit: Dr. Andrey Campanile - unscheduled Prior Therapy: hip replacement - last year  Authorization: Medicare  Authorization Time Period:    Authorization Visit#: 3 of 10   Subjective: Symptoms/Limitations Symptoms: Pt reports he is feeling good today, no pain. Has been doing a lot of toe exercises Pain Assessment Currently in Pain?: No/denies  Objective:   Exercise/Treatments Standing Heel Raises: 15 reps;Limitations Heel Raises Limitations: w/o HHA Knee Flexion: Both;15 reps Functional Squat: 15 reps (therapist facilitation)  Ankle Exercises - Seated Towel Crunch: 1 rep;Limitations Towel Crunch Limitations: BLE, 6 minutes Towel Inversion/Eversion: 1 rep;Limitations Towel Inversion/Eversion Limitations: BLE Marble Pickup: 1 rep 10 marbles BLE Toe Raise: 15 reps;5 seconds Balance Exercises Standing Standing Eyes Opened: Narrow base of support (BOS);Foam;Limitations Standing Eyes Opened Limitations: Standing 60" woith NBOS on foam, 20 head rotations on foam with NBOS Standing, One Foot on a Step: 4 inch;2 reps;Limitations Standing, One Foot on a Step Limitations: BLE w/min guard 60 second holes no HHA tandem stance Cone Rotation: Foam;R/L;Limitations Cone Rotation Limitations: NBOS   Physical Therapy Assessment and Plan PT Assessment and Plan Clinical Impression Statement: Progressed to dynamic surface and NBOS to improve balance, min assistance and vc-ing to improve spatial awareness.  Added inversion/eversion for ankle strengthening, pt continues to demonstrate most difficulty with coordinatied movements and  difficulty with proprioception with toe movements and  foot placement.   No reports of pain through session, limited by fatigue.   PT Plan: Continue with current POC Advance HEP for ankle exercises to include therband.    Goals Home Exercise Program Pt/caregiver will Perform Home Exercise Program: For improved balance PT Short Term Goals Time to Complete Short Term Goals: 4 weeks PT Short Term Goal 1: Pt will improve his BLE ankle strength by 1 muscle grade for improve hip and ankle strategy to assist with dynamic balance.  PT Short Term Goal 1 - Progress: Progressing toward goal PT Short Term Goal 2: Pt will improve proprioceptive awareness and demonstrate standing balane with wide BOS w/eyes closed x30 seconds with supervision.  PT Short Term Goal 2 - Progress: Progressing toward goal PT Short Term Goal 3: Pt will improve his berg balance score to greater than 40 to decrease risk of falls.  PT Long Term Goals Time to Complete Long Term Goals: 8 weeks PT Long Term Goal 1: Pt will improve his BLE strength to [facility] in order to tolerate standing and walking outdoors with supervision.  PT Long Term Goal 2: Pt will improve his dynamic balance in order to ambulate independently indoors and outdoors with supervison.  Long Term Goal 3: Pt will improve his berg to 50/56 to decrease risk of falls. Long Term Goal 4: Pt will improve his TUG to 13 seconds without AD to improve gait speed in the community.  PT Long Term Goal 5: Pt will improve his FOTO to limitation less than 24% for improved QOL.   Problem List Patient Active Problem List   Diagnosis Date Noted  . 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 Weakness of left leg 01/10/2012  . Difficulty in walking(719.7) 01/10/2012  . Pain in left hip 01/10/2012  . Acute blood loss anemia 12/21/2011  . Cirrhosis 09/08/2011  . Umbilical hernia 09/08/2011  .  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  . Hepatomegaly 06/14/2011  . Encounter for screening colonoscopy 06/14/2011  . JOINT EFFUSION, KNEE 02/10/2008  . KNEE PAIN 02/10/2008     PT - End of Session Equipment Utilized During Treatment: Gait belt Activity Tolerance: Patient limited by fatigue;Patient tolerated treatment well General Behavior During Therapy: [facility] for tasks assessed/performed  GP    Juel Burrow 02/18/2013, 2:32 PM

## 2013-02-24 ENCOUNTER — Ambulatory Visit (HOSPITAL_COMMUNITY)
Admission: RE | Admit: 2013-02-24 | Discharge: 2013-02-24 | Disposition: A | Discharge status: Home / Self Care | Payer: Medicare Other | Source: Ambulatory Visit | Attending: Otolaryngology | Admitting: Otolaryngology

## 2013-02-24 NOTE — Progress Notes (Signed)
Physical Therapy Treatment Patient Details  Name: Ian Aguilar MRN: [id number] Date of Birth: [birth year]  Today's Date: 02/24/2013 Time: 4098-1191 PT Time Calculation (min): 40 min Charges: NMR x 38'  Visit#: 4 of 10  Re-eval: 03/13/13  Authorization: Medicare  Authorization Visit#: 4 of 10   Subjective: Symptoms/Limitations Symptoms: Pt reports continued HEP compliance. Pain Assessment Currently in Pain?: No/denies   Exercise/Treatments Ankle Exercises - Seated Towel Crunch Limitations: BLE 2' Towel Inversion/Eversion: 5 reps Towel Inversion/Eversion Limitations: BLE Marble Pickup: 1 rep 10 marbles BLE Balance Exercises Standing Standing Eyes Opened: Narrow base of support (BOS);Foam;Head turns;5 reps Numbers 1-15: 1 rep;Foam Heel Raises: 10 reps Toe Raise: 10 reps   Physical Therapy Assessment and Plan PT Assessment and Plan Clinical Impression Statement: Therapist facilitated activities to improve proprioceptive control and coordination. Pt displays improved coordination with marble pick up. Pt displays improve proprioceptive control on dynamic surfaces. Pt appears to be progression well toward all goals. PT Plan: Continue with current POC Advance HEP for ankle exercises to include therband.     Problem List Patient Active Problem List   Diagnosis Date Noted  . 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 Weakness of left leg 01/10/2012  . Difficulty in walking(719.7) 01/10/2012  . Pain in left hip 01/10/2012  . Acute blood loss anemia 12/21/2011  . Cirrhosis 09/08/2011  . Umbilical hernia 09/08/2011  . 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  . Hepatomegaly 06/14/2011  . Encounter for screening colonoscopy 06/14/2011  . JOINT EFFUSION, KNEE 02/10/2008  . KNEE PAIN 02/10/2008    PT - End of Session Equipment Utilized During Treatment: Gait belt Activity Tolerance: Patient limited by fatigue;Patient tolerated treatment well General Behavior During Therapy: [facility]  for tasks assessed/performed  Seth Bake, PTA  02/24/2013, 5:29 PM

## 2013-02-28 ENCOUNTER — Ambulatory Visit (HOSPITAL_COMMUNITY): Payer: Medicare Other | Admitting: Physical Therapy

## 2013-02-28 ENCOUNTER — Telehealth (HOSPITAL_COMMUNITY): Payer: Self-pay

## 2013-03-03 ENCOUNTER — Ambulatory Visit (HOSPITAL_COMMUNITY)
Admission: RE | Admit: 2013-03-03 | Discharge: 2013-03-03 | Disposition: A | Discharge status: Home / Self Care | Payer: Medicare Other | Source: Ambulatory Visit | Attending: Family Medicine | Admitting: Family Medicine

## 2013-03-03 DIAGNOSIS — M6281 Muscle weakness (generalized): Secondary | ICD-10-CM | POA: Insufficient documentation

## 2013-03-03 DIAGNOSIS — M25569 Pain in unspecified knee: Secondary | ICD-10-CM | POA: Insufficient documentation

## 2013-03-03 DIAGNOSIS — M25559 Pain in unspecified hip: Secondary | ICD-10-CM | POA: Insufficient documentation

## 2013-03-03 DIAGNOSIS — IMO0001 Reserved for inherently not codable concepts without codable children: Secondary | ICD-10-CM | POA: Insufficient documentation

## 2013-03-03 DIAGNOSIS — R262 Difficulty in walking, not elsewhere classified: Secondary | ICD-10-CM | POA: Insufficient documentation

## 2013-03-03 NOTE — Evaluation (Signed)
Physical Therapy Discharge Summary  Patient Details  Name: Ian Aguilar MRN: [id number] Date of Birth: 16-Nov-1940  Today's Date: 03/03/2013 Time: 3154-0086 PT Time Calculation (min): 44 min Charges: MMT x 1 [phone]) PPT x 15' [phone]) Self care x 12' (1332-1344)              Visit#: 5 of 10  Re-eval: 03/13/13 Assessment Diagnosis: abnormal gait Next MD Visit: Dr. Redmond Pulling - unscheduled Prior Therapy: hip replacement - last year  Authorization: Medicare    Authorization Visit#: 5 of 10   Past Medical History:  Past Medical History  Diagnosis Date  . COPD (chronic obstructive pulmonary disease)   . ETOH abuse   . Cirrhosis     confirmed by MRI on 07/04/11.  no immunizations  . Hyperlipidemia   . Depression with anxiety   . Anxiety   . Arthritis    Past Surgical History:  Past Surgical History  Procedure Laterality Date  . Knee surgery      left-arthroscopy-[facility]  . Skull fracture elevation      fractured cheeck bone repaired  . Broken arm  6 yrs ago    left otif of wrist-[facility]  . External ear surgery      right ear-cleaned out ear and created new eardrum  . Wisdom tooth extraction    . Bilateral cataract surgery      [place]  . Colonoscopy  1996    [facility]: external hemorrhoids, no polyps  . Colonoscopy  06/28/2011    Dr. Ala Bent diverticulosis,tubular adenoma, hyperplastic polyp  . Esophagogastroduodenoscopy  06/28/2011    Dr. Chelsea Aus erosive reflux esophagitis, hiatal hernia-gastritis  . Inguinal hernia repair  2-3 yrs ago    left-[facility]_  . Total hip arthroplasty  12/18/2011    Procedure: TOTAL HIP ARTHROPLASTY;  Surgeon: Carole Civil, MD;  Location: [facility];  Service: Orthopedics;  Laterality: Left;    Subjective Symptoms/Limitations Symptoms: Pt states that his wife is having heart surgery this week and he is planning on having eye surgery in the near future. He feels that he might need to hold therapy for now. Pain  Assessment Currently in Pain?: No/denies   Assessment RLE Strength RLE Overall Strength Comments: taken in seated position Right Hip Flexion: 5/5 (was 5/5 (02/11/13)) Right Hip Extension: 5/5 (was 4/5 (02/11/13)) Right Hip ABduction: 5/5 (was 4/5 (02/11/13)) Right Hip ADduction: 5/5 (was 3+/5 (02/11/13)) Right Knee Flexion: 5/5 (was 5/5 (02/11/13)) Right Knee Extension: 5/5 Right Ankle Dorsiflexion: 4/5 (was (02/11/13)) Right Ankle Plantar Flexion: 5/5 (was 4/5 (02/11/13)) Right Ankle Inversion:  (4+/5 was 3+/5 (02/11/13)) Right Ankle Eversion:  (4+/5 was 3+/5 (02/11/13)) LLE Strength LLE Overall Strength Comments: taken in seated position Left Hip Flexion: 5/5 (was 4/5 (02/11/13)) Left Hip Extension:  (4+/5 was 3+/5 (02/11/13)) Left Hip ABduction: 5/5 (was 4/5 (02/11/13)) Left Hip ADduction: 5/5 (was 3+/5 (02/11/13)) Left Knee Flexion: 5/5 (was 5/5 (02/11/13)) Left Knee Extension: 5/5 (was 5/5 (02/11/13)) Left Ankle Dorsiflexion: 4/5 (was 3+/5 (02/11/13)) Left Ankle Plantar Flexion: 5/5 (was 4/5 (02/11/13)) Left Ankle Inversion:  (4+/5 was 3+/5 (02/11/13)) Left Ankle Eversion:  (4+/5 was 3+/5 (02/11/13))  Exercise/Treatments Mobility/Balance  Static Standing Balance Single Leg Stance - Right Leg: 5 (was 0 (02/11/13)) Single Leg Stance - Left Leg: 5 (was 0 (02/11/13)) Berg Balance Test Sit to Stand: Able to stand without using hands and stabilize independently Standing Unsupported: Able to stand safely 2 minutes Sitting with Back Unsupported but Feet Supported on Floor or Stool: Able to  sit safely and securely 2 minutes Stand to Sit: Sits safely with minimal use of hands Transfers: Able to transfer safely, minor use of hands Standing Unsupported with Eyes Closed: Able to stand 10 seconds with supervision Standing Ubsupported with Feet Together: Able to place feet together independently and stand for 1 minute with supervision From Standing, Reach Forward with Outstretched  Arm: Can reach forward >12 cm safely (5") From Standing Position, Pick up Object from Floor: Able to pick up shoe, needs supervision From Standing Position, Turn to Look Behind Over each Shoulder: Looks behind one side only/other side shows less weight shift Turn 360 Degrees: Able to turn 360 degrees safely one side only in 4 seconds or less Standing Unsupported, Alternately Place Feet on Step/Stool: Able to stand independently and complete 8 steps >20 seconds Standing Unsupported, One Foot in Front: Able to take small step independently and hold 30 seconds Standing on One Leg: Able to lift leg independently and hold equal to or more than 3 seconds Total Score: 45 Timed Up and Go Test Normal TUG (seconds): 11 (w/ SPC (was 14 on 02/11/13))    Physical Therapy Assessment and Plan PT Assessment and Plan Clinical Impression Statement: Pt has progressed well with therapy. Strength and balance have significantly improved. FOTO has improved 9%. Pt is independent with HEP. Pt requests to D/C to HEP secondary multiple personal conflicts. PT Plan: Recommend D/C to HEP per pt request.    Goals Home Exercise Program Pt/caregiver will Perform Home Exercise Program: For improved balance PT Short Term Goals Time to Complete Short Term Goals: 4 weeks PT Short Term Goal 1: Pt will improve his BLE ankle strength by 1 muscle grade for improve hip and ankle strategy to assist with dynamic balance.  PT Short Term Goal 1 - Progress: Met PT Short Term Goal 2: Pt will improve proprioceptive awareness and demonstrate standing balane with wide BOS w/eyes closed x30 seconds with supervision.  PT Short Term Goal 2 - Progress: Met PT Short Term Goal 3: Pt will improve his berg balance score to greater than 40 to decrease risk of falls.  PT Short Term Goal 3 - Progress: Met PT Long Term Goals Time to Complete Long Term Goals: 8 weeks PT Long Term Goal 1: Pt will improve his BLE strength to [facility] in order to tolerate  standing and walking outdoors with supervision.  PT Long Term Goal 1 - Progress: Progressing toward goal PT Long Term Goal 2: Pt will improve his dynamic balance in order to ambulate independently indoors and outdoors with supervison.  PT Long Term Goal 2 - Progress: Progressing toward goal Long Term Goal 3: Pt will improve his berg to 50/56 to decrease risk of falls. Long Term Goal 3 Progress: Progressing toward goal Long Term Goal 4: Pt will improve his TUG to 13 seconds without AD to improve gait speed in the community.  Long Term Goal 4 Progress: Progressing toward goal PT Long Term Goal 5: Pt will improve his FOTO to limitation less than 24% for improved QOL.  (Currently 25%) Long Term Goal 5 Progress: Met  Problem List Patient Active Problem List   Diagnosis Date Noted  . 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 Weakness of left leg 01/10/2012  . Difficulty in walking(719.7) 01/10/2012  . Pain in left hip 01/10/2012  . Acute blood loss anemia 12/21/2011  . Cirrhosis 09/08/2011  . Umbilical hernia 48/02/6551  . 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  . Hepatomegaly 06/14/2011  . Encounter  for screening colonoscopy 06/14/2011  . JOINT EFFUSION, KNEE 02/10/2008  . KNEE PAIN 02/10/2008    PT - End of Session Equipment Utilized During Treatment: Gait belt Activity Tolerance: Patient limited by fatigue;Patient tolerated treatment well General Behavior During Therapy: [facility] for tasks assessed/performed  Rachelle Hora, PTA  03/03/2013, 4:59 PM  Physician Documentation Your signature is required to indicate approval of the treatment plan as stated above.  Please sign and either send electronically or make a copy of this report for your files and return this physician signed original.   Please mark one 1.__approve of plan  2. ___approve of plan with the following conditions.   ______________________________                                                           _____________________ Physician Signature                                                                                                             Date

## 2013-03-05 ENCOUNTER — Ambulatory Visit (HOSPITAL_COMMUNITY): Payer: Medicare Other

## 2013-03-07 ENCOUNTER — Ambulatory Visit (HOSPITAL_COMMUNITY): Payer: Medicare Other

## 2013-04-24 ENCOUNTER — Encounter (INDEPENDENT_AMBULATORY_CARE_PROVIDER_SITE_OTHER): Payer: Self-pay | Admitting: Ophthalmology

## 2013-04-25 ENCOUNTER — Encounter (INDEPENDENT_AMBULATORY_CARE_PROVIDER_SITE_OTHER): Payer: Medicare Other | Admitting: Ophthalmology

## 2013-04-25 DIAGNOSIS — H43819 Vitreous degeneration, unspecified eye: Secondary | ICD-10-CM

## 2013-04-25 DIAGNOSIS — H35379 Puckering of macula, unspecified eye: Secondary | ICD-10-CM

## 2013-04-25 DIAGNOSIS — H3530 Unspecified macular degeneration: Secondary | ICD-10-CM

## 2013-04-25 DIAGNOSIS — H26499 Other secondary cataract, unspecified eye: Secondary | ICD-10-CM

## 2013-05-01 ENCOUNTER — Other Ambulatory Visit (INDEPENDENT_AMBULATORY_CARE_PROVIDER_SITE_OTHER): Payer: Medicare Other | Admitting: Ophthalmology

## 2013-05-01 DIAGNOSIS — H26499 Other secondary cataract, unspecified eye: Secondary | ICD-10-CM

## 2013-05-22 ENCOUNTER — Ambulatory Visit (INDEPENDENT_AMBULATORY_CARE_PROVIDER_SITE_OTHER): Payer: Medicare Other | Admitting: Ophthalmology

## 2013-05-22 DIAGNOSIS — H2700 Aphakia, unspecified eye: Secondary | ICD-10-CM

## 2013-12-23 ENCOUNTER — Ambulatory Visit (INDEPENDENT_AMBULATORY_CARE_PROVIDER_SITE_OTHER): Payer: Medicare Other | Admitting: Orthopedic Surgery

## 2013-12-23 ENCOUNTER — Encounter: Payer: Self-pay | Admitting: Orthopedic Surgery

## 2013-12-23 ENCOUNTER — Ambulatory Visit (INDEPENDENT_AMBULATORY_CARE_PROVIDER_SITE_OTHER): Payer: Medicare Other

## 2013-12-23 VITALS — BP 170/83 | Ht 71.0 in | Wt 154.0 lb

## 2013-12-23 DIAGNOSIS — M1990 Unspecified osteoarthritis, unspecified site: Secondary | ICD-10-CM

## 2013-12-23 DIAGNOSIS — Z9660 Presence of unspecified orthopedic joint implant: Secondary | ICD-10-CM

## 2013-12-23 DIAGNOSIS — Z96649 Presence of unspecified artificial hip joint: Secondary | ICD-10-CM

## 2013-12-23 DIAGNOSIS — M1610 Unilateral primary osteoarthritis, unspecified hip: Secondary | ICD-10-CM

## 2013-12-23 DIAGNOSIS — Z96642 Presence of left artificial hip joint: Secondary | ICD-10-CM

## 2013-12-23 NOTE — Progress Notes (Signed)
Patient ID: Ian Aguilar, male   DOB: Jan 08, 1941, 73 y.o.   MRN: [id number] Chief Complaint  Patient presents with  . Follow-up    yearly recheck + xrays Left THA, DOS 12/18/11    The patient had a left hip replacement 2 years ago. His only complaint is that he has discomfort when he rolls onto his left hip. Review of systems he still having issues with balance but otherwise is happy with his hip  He had a [place] press fit wedge stem and a press-fit cup.  BP 170/83  Ht 5\' 11"  (1.803 m)  Wt 154 lb (69.854 kg)  BMI 21.49 kg/m2 The range of motion in both hips is nearly equivalent. His leg lengths are equal in his hip is stable. He has normal muscle tone in both lower extremities. His distal pulses are intact and sensation is normal.  My interpretation of the x-ray that was ordered today is that he has a stable hip prosthesis without complication  Follow-up one year.

## 2014-12-24 ENCOUNTER — Encounter: Payer: Self-pay | Admitting: Orthopedic Surgery

## 2014-12-24 ENCOUNTER — Ambulatory Visit (INDEPENDENT_AMBULATORY_CARE_PROVIDER_SITE_OTHER): Payer: Medicare Other

## 2014-12-24 ENCOUNTER — Ambulatory Visit (INDEPENDENT_AMBULATORY_CARE_PROVIDER_SITE_OTHER): Payer: Medicare Other | Admitting: Orthopedic Surgery

## 2014-12-24 VITALS — BP 142/79 | Ht 71.0 in | Wt 154.0 lb

## 2014-12-24 DIAGNOSIS — Z96642 Presence of left artificial hip joint: Secondary | ICD-10-CM

## 2014-12-24 NOTE — Progress Notes (Signed)
Patient ID: Ian Aguilar, male   DOB: 1941/01/16, 75 y.o.   MRN: [id number]  Chief Complaint  Patient presents with  . Follow-up    yearly follow up + xray left THA, DOS 12/18/11    HPI Ian Aguilar is a 74 y.o. male.   Status post left total hip arthroplasty. The patient is doing well the hip implant is functioning well.  Review of Systems Review of Systems  He does not complain of walking abnormalities or pain along his hip  Past Medical History  Diagnosis Date  . COPD (chronic obstructive pulmonary disease)   . ETOH abuse   . Cirrhosis     confirmed by MRI on 07/04/11.  no immunizations  . Hyperlipidemia   . Depression with anxiety   . Anxiety   . Arthritis     Past Surgical History  Procedure Laterality Date  . Knee surgery      left-arthroscopy-[place]  . Skull fracture elevation      fractured cheeck bone repaired  . Broken arm  6 yrs ago    left otif of wrist-[facility]  . External ear surgery      right ear-cleaned out ear and created new eardrum  . Wisdom tooth extraction    . Bilateral cataract surgery      [place]  . Colonoscopy  1996    [facility]: external hemorrhoids, no polyps  . Colonoscopy  06/28/2011    Dr. Ala Bent diverticulosis,tubular adenoma, hyperplastic polyp  . Esophagogastroduodenoscopy  06/28/2011    Dr. Chelsea Aus erosive reflux esophagitis, hiatal hernia-gastritis  . Inguinal hernia repair  2-3 yrs ago    left-[facility]  . Total hip arthroplasty  12/18/2011    Procedure: TOTAL HIP ARTHROPLASTY;  Surgeon: Carole Civil, MD;  Location: [facility];  Service: Orthopedics;  Laterality: Left;     No Known Allergies  Current Outpatient Prescriptions  Medication Sig Dispense Refill  . ALPRAZolam (XANAX) 0.5 MG tablet Take by mouth every 6 (six) hours as needed. For anxiety    . tiotropium (SPIRIVA) 18 MCG inhalation capsule Place 18 mcg into inhaler and inhale daily.     No current facility-administered medications for this  visit.      Physical Exam Blood pressure 142/79, height '5\' 11"'$  (1.803 m), weight 154 lb (69.854 kg).  Overall appearance normal grooming and hygiene. The patient is awake alert and oriented 3 with a pleasant mood and affect. The patient is ambulatory without assistive device and without limping.  Skin incision is clean dry and intact. Muscle tone is normal and the hip is stable. Flexion is 120   Data Reviewed Today's imaging shows stable total hip implant without loosening or complication  Assessment    Encounter Diagnosis  Name Primary?  . History of hip replacement, total, left Yes        Plan    1 yr xrays left hip         Arther Abbott 12/24/2014, 12:08 PM

## 2015-03-03 DIAGNOSIS — G44219 Episodic tension-type headache, not intractable: Secondary | ICD-10-CM | POA: Diagnosis not present

## 2015-05-28 DIAGNOSIS — J449 Chronic obstructive pulmonary disease, unspecified: Secondary | ICD-10-CM | POA: Diagnosis not present

## 2015-05-28 DIAGNOSIS — Z6822 Body mass index (BMI) 22.0-22.9, adult: Secondary | ICD-10-CM | POA: Diagnosis not present

## 2015-05-28 DIAGNOSIS — Z1389 Encounter for screening for other disorder: Secondary | ICD-10-CM | POA: Diagnosis not present

## 2015-05-28 DIAGNOSIS — Z719 Counseling, unspecified: Secondary | ICD-10-CM | POA: Diagnosis not present

## 2015-05-28 DIAGNOSIS — F419 Anxiety disorder, unspecified: Secondary | ICD-10-CM | POA: Diagnosis not present

## 2015-05-28 DIAGNOSIS — L570 Actinic keratosis: Secondary | ICD-10-CM | POA: Diagnosis not present

## 2015-06-11 ENCOUNTER — Emergency Department (HOSPITAL_COMMUNITY): Payer: PPO

## 2015-06-11 ENCOUNTER — Emergency Department (HOSPITAL_COMMUNITY)
Admission: EM | Admit: 2015-06-11 | Discharge: 2015-06-11 | Disposition: A | Discharge status: Home / Self Care | Payer: PPO | Attending: Emergency Medicine | Admitting: Emergency Medicine

## 2015-06-11 ENCOUNTER — Encounter (HOSPITAL_COMMUNITY): Payer: Self-pay | Admitting: *Deleted

## 2015-06-11 DIAGNOSIS — Y939 Activity, unspecified: Secondary | ICD-10-CM | POA: Diagnosis not present

## 2015-06-11 DIAGNOSIS — S72115A Nondisplaced fracture of greater trochanter of left femur, initial encounter for closed fracture: Secondary | ICD-10-CM | POA: Diagnosis not present

## 2015-06-11 DIAGNOSIS — E785 Hyperlipidemia, unspecified: Secondary | ICD-10-CM | POA: Insufficient documentation

## 2015-06-11 DIAGNOSIS — Y929 Unspecified place or not applicable: Secondary | ICD-10-CM | POA: Diagnosis not present

## 2015-06-11 DIAGNOSIS — F329 Major depressive disorder, single episode, unspecified: Secondary | ICD-10-CM | POA: Insufficient documentation

## 2015-06-11 DIAGNOSIS — S72112A Displaced fracture of greater trochanter of left femur, initial encounter for closed fracture: Secondary | ICD-10-CM | POA: Diagnosis not present

## 2015-06-11 DIAGNOSIS — W010XXA Fall on same level from slipping, tripping and stumbling without subsequent striking against object, initial encounter: Secondary | ICD-10-CM | POA: Diagnosis not present

## 2015-06-11 DIAGNOSIS — S79912A Unspecified injury of left hip, initial encounter: Secondary | ICD-10-CM | POA: Diagnosis not present

## 2015-06-11 DIAGNOSIS — J449 Chronic obstructive pulmonary disease, unspecified: Secondary | ICD-10-CM | POA: Diagnosis not present

## 2015-06-11 DIAGNOSIS — Y999 Unspecified external cause status: Secondary | ICD-10-CM | POA: Diagnosis not present

## 2015-06-11 DIAGNOSIS — F17210 Nicotine dependence, cigarettes, uncomplicated: Secondary | ICD-10-CM | POA: Insufficient documentation

## 2015-06-11 DIAGNOSIS — Z79899 Other long term (current) drug therapy: Secondary | ICD-10-CM | POA: Diagnosis not present

## 2015-06-11 MED ORDER — OXYCODONE-ACETAMINOPHEN 5-325 MG PO TABS: 2.0000 | ORAL_TABLET | ORAL | Status: DC | PRN

## 2015-06-11 MED ORDER — OXYCODONE-ACETAMINOPHEN 5-325 MG PO TABS
1.0000 | ORAL_TABLET | ORAL | Status: DC | PRN
Start: 2015-06-11 — End: 2015-07-08

## 2015-06-11 NOTE — ED Notes (Signed)
Pt states he was going to bed last night and his hands slipped off the mattress. Pt states he fell on his left hip. Pt c/o pain to left and not being able to walk with out holding on to something. Pt has had previous left hip surgery.

## 2015-06-11 NOTE — ED Provider Notes (Signed)
CSN: [id number]     Arrival date & time 06/11/15  1910 History   First MD Initiated Contact with Patient 06/11/15 1958     Chief Complaint  Patient presents with  . Fall     HPI Patient reports she was going to bed last night and his hand slipped off the mattress and fell and injured his left hip.  He underwent hip arthroplasty of his left hip 3 years ago by Dr. Aline Brochure.  He reports pain to the left hip and reports not being on a walk without holding on to something.  He has been able to bear some weight on the left leg.  His pain is moderate in severity and worse with ambulation.  No other injury from the fall.  He's tried ibuprofen without improvement in his symptoms. he is brought to the emergency department by son   Past Medical History  Diagnosis Date  . COPD (chronic obstructive pulmonary disease) ([place])   . ETOH abuse   . Cirrhosis ([place])     confirmed by MRI on 07/04/11.  no immunizations  . Hyperlipidemia   . Depression with anxiety   . Anxiety   . Arthritis    Past Surgical History  Procedure Laterality Date  . Knee surgery      left-arthroscopy-[facility]  . Skull fracture elevation      fractured cheeck bone repaired  . Broken arm  6 yrs ago    left otif of wrist-[facility]  . External ear surgery      right ear-cleaned out ear and created new eardrum  . Wisdom tooth extraction    . Bilateral cataract surgery      [place]  . Colonoscopy  1996    [facility]: external hemorrhoids, no polyps  . Colonoscopy  06/28/2011    Dr. Ala Bent diverticulosis,tubular adenoma, hyperplastic polyp  . Esophagogastroduodenoscopy  06/28/2011    Dr. Chelsea Aus erosive reflux esophagitis, hiatal hernia-gastritis  . Inguinal hernia repair  2-3 yrs ago    left-[facility]  . Total hip arthroplasty  12/18/2011    Procedure: TOTAL HIP ARTHROPLASTY;  Surgeon: Carole Civil, MD;  Location: [facility];  Service: Orthopedics;  Laterality: Left;   Family History  Problem Relation Age of Onset   . Colon cancer Neg Hx    Social History  Substance Use Topics  . Smoking status: Current Every Day Smoker -- 0.25 packs/day for 60 years    Types: Cigarettes  . Smokeless tobacco: Never Used     Comment: 4-10 a day  . Alcohol Use: 1.2 oz/week    2 Cans of beer per week     Comment:  beer only. 6-8 bottles per day.    Review of Systems  All other systems reviewed and are negative.     Allergies  Review of patient's allergies indicates no known allergies.  Home Medications   Prior to Admission medications   Medication Sig Start Date End Date Taking? Authorizing Provider  ALPRAZolam Duanne Moron) 0.5 MG tablet Take by mouth every 6 (six) hours as needed. For anxiety    Historical Provider, MD  tiotropium (SPIRIVA) 18 MCG inhalation capsule Place 18 mcg into inhaler and inhale daily.    Historical Provider, MD   BP 177/60 mmHg  Pulse 66  Temp(Src) 97.6 F (36.4 C) (Oral)  Resp 18  Ht '5\' 10"'$  (1.778 m)  Wt 155 lb (70.308 kg)  BMI 22.24 kg/m2  SpO2 96% Physical Exam  Constitutional: He is oriented to person, place, and  time. He appears well-developed and well-nourished.  HENT:  Head: Normocephalic.  Eyes: EOM are normal.  Neck: Normal range of motion.  Pulmonary/Chest: Effort normal.  Abdominal: He exhibits no distension.  Musculoskeletal: Normal range of motion.  Normal leg length of the left lower extremity.  Able to fully flex and extend at the left hip and no significant pain with internal or external rotation of left hip.  Normal pulses left foot.  Compartments of the left lower sugar soft.  Some tenderness to palpation of the left lateral hip and some to the left pubic rami  Neurological: He is alert and oriented to person, place, and time.  Psychiatric: He has a normal mood and affect.  Nursing note and vitals reviewed.   ED Course  Procedures (including critical care time) Labs Review Labs Reviewed - No data to display  Imaging Review Dg Hip Unilat With Pelvis  2-3 Views Left  06/11/2015  CLINICAL DATA:  Left hip pain after a fall. EXAM: DG HIP (WITH OR WITHOUT PELVIS) 2-3V LEFT COMPARISON:  12/24/2014. FINDINGS: Left hip arthroplasty. There is minimal displacement of the left greater trochanter, new. No additional evidence of an acute fracture. Superior joint space narrowing in the right hip. Osteopenia. IMPRESSION: 1. Nondisplaced fracture of the left greater trochanter, adjacent to a left total hip arthroplasty. 2. Right hip osteoarthritis. Electronically Signed   By: Lorin Picket M.D.   On: 06/11/2015 19:46   I have personally reviewed and evaluated these images and lab results as part of my medical decision-making.   EKG Interpretation None      MDM   Final diagnoses:  None    I personally reviewed the patient's x-rays.  There is a nondisplaced fracture of the left greater trochanter at the midportion of the femoral stem component of the left hip arthroplasty.  I spoke with Dr. Luna Glasgow, orthopedics, who states given this fracture the patient can be toe touch weightbearing without full weightbearing. This was explained to the pt and the pts son.  Patient will need orthopedic follow-up this coming week.  I was able to speak with the case manager at [facility] who will arrange to have a rolling walker delivered to the patient's house first thing in the morning with home health.  The patient's son will stay with him.  He's been given a urinal to help him urinate.  He understands return to the emergency department for new or worsening symptoms    Jola Schmidt, MD 06/11/15 2330

## 2015-06-11 NOTE — Care Management (Signed)
CM contacted by Dr. Venora Maples  EDP at [facility] regarding DME. A rolling walker was recommended for assistance with ambulation. CM spoke with patient via phone to discuss the recommendation patient is agreeable. Choices are limited due Medical Supplies being closed on the holiday weekend. [facility] is available for delivery to patient's home tomorrow 4/15, patient is agreeable. Verified patient contact information, Referral faxed to [facility] [phone], fax confirmation received. Updated patient teach back done, verbalized understanding. No further questions or concerns verbalized.

## 2015-06-15 MED FILL — Oxycodone w/ Acetaminophen Tab 5-325 MG: ORAL | Qty: 6 | Status: AC

## 2015-06-17 ENCOUNTER — Ambulatory Visit (INDEPENDENT_AMBULATORY_CARE_PROVIDER_SITE_OTHER): Payer: PPO | Admitting: Orthopedic Surgery

## 2015-06-17 VITALS — BP 187/93 | HR 78 | Ht 70.0 in | Wt 151.0 lb

## 2015-06-17 DIAGNOSIS — S72115A Nondisplaced fracture of greater trochanter of left femur, initial encounter for closed fracture: Secondary | ICD-10-CM

## 2015-06-17 DIAGNOSIS — M9702XA Periprosthetic fracture around internal prosthetic left hip joint, initial encounter: Secondary | ICD-10-CM | POA: Diagnosis not present

## 2015-06-17 MED ORDER — OXYCODONE-ACETAMINOPHEN 5-325 MG PO TABS: 1.0000 | ORAL_TABLET | ORAL | Status: DC | PRN

## 2015-06-17 NOTE — Progress Notes (Signed)
Patient ID: Ian Aguilar, male   DOB: Mar 13, 1940, 75 y.o.   MRN: [id number]  Chief Complaint  Patient presents with  . Follow-up    ER follow up left hip fracture s/p THA 12/18/11    HPI Ian Aguilar is a 75 y.o. male.  This 75 year old male presents with a history of a left total hip in 2013 did well as of his last visit in October but fell a week ago on April 11 and fracture the greater trochanter of his left total hip prosthesis. He complains of a catching sensation with sharp constant pain 9 out of 10 relieved by oxycodone and weightbearing with a walker.    Review of Systems Review of Systems He denies locking stiffness giving way burning stabbing or aching or radiating pain. Review of systems allergies seasonal allergies  Joint pain  Denies fever or chills or recent weight loss.  Past Medical History  Diagnosis Date  . COPD (chronic obstructive pulmonary disease) ([place])   . ETOH abuse   . Cirrhosis ([place])     confirmed by MRI on 07/04/11.  no immunizations  . Hyperlipidemia   . Depression with anxiety   . Anxiety   . Arthritis     Past Surgical History  Procedure Laterality Date  . Knee surgery      left-arthroscopy-[place]  . Skull fracture elevation      fractured cheeck bone repaired  . Broken arm  6 yrs ago    left otif of wrist-Ian Aguilar  . External ear surgery      right ear-cleaned out ear and created new eardrum  . Wisdom tooth extraction    . Bilateral cataract surgery      [place]  . Colonoscopy  1996    [facility]: external hemorrhoids, no polyps  . Colonoscopy  06/28/2011    Dr. Ala Bent diverticulosis,tubular adenoma, hyperplastic polyp  . Esophagogastroduodenoscopy  06/28/2011    Dr. Chelsea Aus erosive reflux esophagitis, hiatal hernia-gastritis  . Inguinal hernia repair  2-3 yrs ago    left-[facility]  . Total hip arthroplasty  12/18/2011    Procedure: TOTAL HIP ARTHROPLASTY;  Surgeon: Carole Civil, MD;  Location: [facility];  Service:  Orthopedics;  Laterality: Left;    Family History  Problem Relation Age of Onset  . Colon cancer Neg Hx     Social History Social History  Substance Use Topics  . Smoking status: Current Every Day Smoker -- 0.25 packs/day for 60 years    Types: Cigarettes  . Smokeless tobacco: Never Used     Comment: 4-10 a day  . Alcohol Use: 1.2 oz/week    2 Cans of beer per week     Comment:  beer only. 6-8 bottles per day.    No Known Allergies  Current Outpatient Prescriptions  Medication Sig Dispense Refill  . oxyCODONE-acetaminophen (PERCOCET/ROXICET) 5-325 MG tablet Take 1 tablet by mouth every 4 (four) hours as needed for severe pain. 25 tablet 0  . tiotropium (SPIRIVA) 18 MCG inhalation capsule Place 18 mcg into inhaler and inhale daily.    Marland Kitchen ALPRAZolam (XANAX) 0.5 MG tablet Take by mouth every 6 (six) hours as needed. Reported on 06/17/2015    . oxyCODONE-acetaminophen (PERCOCET/ROXICET) 5-325 MG tablet Take 1 tablet by mouth every 4 (four) hours as needed for severe pain. 84 tablet 0   No current facility-administered medications for this visit.       Physical Exam  BP 187/93 mmHg  Pulse 78  Ht '5\' 10"'$  (  1.778 m)  Wt 151 lb (68.493 kg)  BMI 21.67 kg/m2  Physical Exam This gentleman is awake alert and oriented 3 has a very thin body frame but grooming hygiene are normal mood is pleasant he's walking with a walker no limp  Tenderness left greater trochanter leg lengths are equal normal range of motion in the hip no pain in the groin hip is stable to push pull muscle tone normal skin normal except for his arm show a lot of ecchymosis from subcutaneous bleeding which is obviously chronic left leg pulse normal temperature normal no edema normal sensation Ortho Exam  Data Reviewed My interpretation of his x-ray is greater trochanteric fracture a type a periprosthetic fracture with a stable implant  Assessment  Greater trochanteric periprosthetic fracture left hip initial  encounter closed injury   Plan  Recommend weight-bear as tolerated x-ray in 3 weeks and 6 weeks continue walker until fracture healing  Continue Percocet

## 2015-07-01 DIAGNOSIS — X32XXXD Exposure to sunlight, subsequent encounter: Secondary | ICD-10-CM | POA: Diagnosis not present

## 2015-07-01 DIAGNOSIS — C44311 Basal cell carcinoma of skin of nose: Secondary | ICD-10-CM | POA: Diagnosis not present

## 2015-07-01 DIAGNOSIS — L821 Other seborrheic keratosis: Secondary | ICD-10-CM | POA: Diagnosis not present

## 2015-07-01 DIAGNOSIS — D225 Melanocytic nevi of trunk: Secondary | ICD-10-CM | POA: Diagnosis not present

## 2015-07-01 DIAGNOSIS — D485 Neoplasm of uncertain behavior of skin: Secondary | ICD-10-CM | POA: Diagnosis not present

## 2015-07-01 DIAGNOSIS — L570 Actinic keratosis: Secondary | ICD-10-CM | POA: Diagnosis not present

## 2015-07-08 ENCOUNTER — Ambulatory Visit (INDEPENDENT_AMBULATORY_CARE_PROVIDER_SITE_OTHER): Payer: PPO

## 2015-07-08 ENCOUNTER — Ambulatory Visit (INDEPENDENT_AMBULATORY_CARE_PROVIDER_SITE_OTHER): Payer: PPO | Admitting: Orthopedic Surgery

## 2015-07-08 VITALS — BP 204/81 | HR 61 | Ht 70.0 in | Wt 151.0 lb

## 2015-07-08 DIAGNOSIS — S72142D Displaced intertrochanteric fracture of left femur, subsequent encounter for closed fracture with routine healing: Secondary | ICD-10-CM | POA: Diagnosis not present

## 2015-07-08 DIAGNOSIS — Z96649 Presence of unspecified artificial hip joint: Secondary | ICD-10-CM

## 2015-07-08 DIAGNOSIS — Z9660 Presence of unspecified orthopedic joint implant: Secondary | ICD-10-CM

## 2015-07-08 NOTE — Progress Notes (Signed)
Chief Complaint  Patient presents with  . Follow-up    Left hip     Status post total hip in 2013 sustained periprosthetic fracture April 11 greater trochanter fracture thought to have stable prosthesis treated with protected weightbearing here for follow-up x-ray no complaints  Exam no tenderness or motion deficit  X-ray fracture healing without prosthetic loosening  Recommend x-ray in one month

## 2015-08-05 ENCOUNTER — Ambulatory Visit (INDEPENDENT_AMBULATORY_CARE_PROVIDER_SITE_OTHER): Payer: PPO | Admitting: Orthopedic Surgery

## 2015-08-05 ENCOUNTER — Ambulatory Visit (INDEPENDENT_AMBULATORY_CARE_PROVIDER_SITE_OTHER): Payer: PPO

## 2015-08-05 VITALS — BP 173/74 | HR 67 | Ht 70.0 in | Wt 149.8 lb

## 2015-08-05 DIAGNOSIS — Z85828 Personal history of other malignant neoplasm of skin: Secondary | ICD-10-CM | POA: Diagnosis not present

## 2015-08-05 DIAGNOSIS — S72142D Displaced intertrochanteric fracture of left femur, subsequent encounter for closed fracture with routine healing: Secondary | ICD-10-CM

## 2015-08-05 DIAGNOSIS — X32XXXD Exposure to sunlight, subsequent encounter: Secondary | ICD-10-CM | POA: Diagnosis not present

## 2015-08-05 DIAGNOSIS — Z08 Encounter for follow-up examination after completed treatment for malignant neoplasm: Secondary | ICD-10-CM | POA: Diagnosis not present

## 2015-08-05 DIAGNOSIS — L570 Actinic keratosis: Secondary | ICD-10-CM | POA: Diagnosis not present

## 2015-08-05 DIAGNOSIS — M9702XD Periprosthetic fracture around internal prosthetic left hip joint, subsequent encounter: Secondary | ICD-10-CM

## 2015-08-05 NOTE — Patient Instructions (Signed)
FOLLOW IN OCT

## 2015-08-05 NOTE — Progress Notes (Signed)
Chief Complaint  Patient presents with  . Follow-up    left hip fracture care   06-08-2015 FRACTURE  Encounter Diagnosis  Name Primary?  . Periprosthetic fracture around internal prosthetic left hip joint, subsequent encounter Yes    Mr. Signorelli has no complaints  Exam of the trachea with a cane  His hip flexion is 125 his x-ray shows a stable periprosthetic fracture  He'll follow-up in October for routine films

## 2015-09-01 ENCOUNTER — Encounter (HOSPITAL_COMMUNITY): Payer: Self-pay | Admitting: Emergency Medicine

## 2015-09-01 ENCOUNTER — Emergency Department (HOSPITAL_COMMUNITY): Payer: PPO

## 2015-09-01 ENCOUNTER — Inpatient Hospital Stay (HOSPITAL_COMMUNITY)
Admission: EM | Admit: 2015-09-01 | Discharge: 2015-09-06 | DRG: 481 | Disposition: A | Discharge status: Skilled Nursing Facility | Payer: PPO | Attending: Internal Medicine | Admitting: Internal Medicine

## 2015-09-01 DIAGNOSIS — K5900 Constipation, unspecified: Secondary | ICD-10-CM | POA: Diagnosis not present

## 2015-09-01 DIAGNOSIS — R55 Syncope and collapse: Secondary | ICD-10-CM | POA: Insufficient documentation

## 2015-09-01 DIAGNOSIS — D62 Acute posthemorrhagic anemia: Secondary | ICD-10-CM | POA: Diagnosis not present

## 2015-09-01 DIAGNOSIS — J438 Other emphysema: Secondary | ICD-10-CM | POA: Diagnosis not present

## 2015-09-01 DIAGNOSIS — S52501A Unspecified fracture of the lower end of right radius, initial encounter for closed fracture: Secondary | ICD-10-CM

## 2015-09-01 DIAGNOSIS — Z419 Encounter for procedure for purposes other than remedying health state, unspecified: Secondary | ICD-10-CM | POA: Insufficient documentation

## 2015-09-01 DIAGNOSIS — Z96642 Presence of left artificial hip joint: Secondary | ICD-10-CM | POA: Diagnosis not present

## 2015-09-01 DIAGNOSIS — S72001A Fracture of unspecified part of neck of right femur, initial encounter for closed fracture: Secondary | ICD-10-CM | POA: Diagnosis not present

## 2015-09-01 DIAGNOSIS — F418 Other specified anxiety disorders: Secondary | ICD-10-CM | POA: Diagnosis present

## 2015-09-01 DIAGNOSIS — K7460 Unspecified cirrhosis of liver: Secondary | ICD-10-CM | POA: Diagnosis present

## 2015-09-01 DIAGNOSIS — R404 Transient alteration of awareness: Secondary | ICD-10-CM | POA: Diagnosis not present

## 2015-09-01 DIAGNOSIS — I10 Essential (primary) hypertension: Secondary | ICD-10-CM | POA: Insufficient documentation

## 2015-09-01 DIAGNOSIS — F17210 Nicotine dependence, cigarettes, uncomplicated: Secondary | ICD-10-CM | POA: Diagnosis not present

## 2015-09-01 DIAGNOSIS — Z01818 Encounter for other preprocedural examination: Secondary | ICD-10-CM | POA: Diagnosis not present

## 2015-09-01 DIAGNOSIS — S52613A Displaced fracture of unspecified ulna styloid process, initial encounter for closed fracture: Secondary | ICD-10-CM | POA: Insufficient documentation

## 2015-09-01 DIAGNOSIS — E871 Hypo-osmolality and hyponatremia: Secondary | ICD-10-CM

## 2015-09-01 DIAGNOSIS — E869 Volume depletion, unspecified: Secondary | ICD-10-CM | POA: Diagnosis not present

## 2015-09-01 DIAGNOSIS — F1010 Alcohol abuse, uncomplicated: Secondary | ICD-10-CM | POA: Diagnosis present

## 2015-09-01 DIAGNOSIS — S52501G Unspecified fracture of the lower end of right radius, subsequent encounter for closed fracture with delayed healing: Secondary | ICD-10-CM | POA: Diagnosis not present

## 2015-09-01 DIAGNOSIS — M25551 Pain in right hip: Secondary | ICD-10-CM | POA: Diagnosis not present

## 2015-09-01 DIAGNOSIS — S52591A Other fractures of lower end of right radius, initial encounter for closed fracture: Secondary | ICD-10-CM | POA: Diagnosis not present

## 2015-09-01 DIAGNOSIS — J449 Chronic obstructive pulmonary disease, unspecified: Secondary | ICD-10-CM | POA: Diagnosis present

## 2015-09-01 DIAGNOSIS — Z96649 Presence of unspecified artificial hip joint: Secondary | ICD-10-CM

## 2015-09-01 DIAGNOSIS — W19XXXA Unspecified fall, initial encounter: Secondary | ICD-10-CM | POA: Diagnosis present

## 2015-09-01 DIAGNOSIS — S72141A Displaced intertrochanteric fracture of right femur, initial encounter for closed fracture: Secondary | ICD-10-CM | POA: Insufficient documentation

## 2015-09-01 DIAGNOSIS — R001 Bradycardia, unspecified: Secondary | ICD-10-CM | POA: Diagnosis not present

## 2015-09-01 DIAGNOSIS — E785 Hyperlipidemia, unspecified: Secondary | ICD-10-CM | POA: Diagnosis not present

## 2015-09-01 DIAGNOSIS — D696 Thrombocytopenia, unspecified: Secondary | ICD-10-CM | POA: Diagnosis not present

## 2015-09-01 DIAGNOSIS — F419 Anxiety disorder, unspecified: Secondary | ICD-10-CM | POA: Diagnosis not present

## 2015-09-01 DIAGNOSIS — K7030 Alcoholic cirrhosis of liver without ascites: Secondary | ICD-10-CM | POA: Diagnosis present

## 2015-09-01 DIAGNOSIS — Z720 Tobacco use: Secondary | ICD-10-CM | POA: Diagnosis not present

## 2015-09-01 DIAGNOSIS — S52611A Displaced fracture of right ulna styloid process, initial encounter for closed fracture: Secondary | ICD-10-CM

## 2015-09-01 DIAGNOSIS — S52614A Nondisplaced fracture of right ulna styloid process, initial encounter for closed fracture: Secondary | ICD-10-CM | POA: Diagnosis not present

## 2015-09-01 DIAGNOSIS — G8918 Other acute postprocedural pain: Secondary | ICD-10-CM | POA: Insufficient documentation

## 2015-09-01 DIAGNOSIS — M25531 Pain in right wrist: Secondary | ICD-10-CM | POA: Diagnosis not present

## 2015-09-01 DIAGNOSIS — R296 Repeated falls: Secondary | ICD-10-CM | POA: Diagnosis present

## 2015-09-01 DIAGNOSIS — S52501D Unspecified fracture of the lower end of right radius, subsequent encounter for closed fracture with routine healing: Secondary | ICD-10-CM | POA: Diagnosis not present

## 2015-09-01 DIAGNOSIS — S72001D Fracture of unspecified part of neck of right femur, subsequent encounter for closed fracture with routine healing: Secondary | ICD-10-CM | POA: Diagnosis not present

## 2015-09-01 DIAGNOSIS — S72141G Displaced intertrochanteric fracture of right femur, subsequent encounter for closed fracture with delayed healing: Secondary | ICD-10-CM | POA: Diagnosis not present

## 2015-09-01 DIAGNOSIS — R0600 Dyspnea, unspecified: Secondary | ICD-10-CM | POA: Diagnosis not present

## 2015-09-01 LAB — CBC WITH DIFFERENTIAL/PLATELET
Basophils Absolute: 0 10*3/uL (ref 0.0–0.1)
Basophils Relative: 0 %
Eosinophils Absolute: 0.1 10*3/uL (ref 0.0–0.7)
Eosinophils Relative: 1 %
HCT: 38.1 % — ABNORMAL LOW (ref 39.0–52.0)
Hemoglobin: 13.2 g/dL (ref 13.0–17.0)
Lymphocytes Relative: 13 %
Lymphs Abs: 0.9 10*3/uL (ref 0.7–4.0)
MCH: 33.8 pg (ref 26.0–34.0)
MCHC: 34.6 g/dL (ref 30.0–36.0)
MCV: 97.4 fL (ref 78.0–100.0)
Monocytes Absolute: 0.4 10*3/uL (ref 0.1–1.0)
Monocytes Relative: 6 %
Neutro Abs: 5.2 10*3/uL (ref 1.7–7.7)
Neutrophils Relative %: 80 %
Platelets: 154 10*3/uL (ref 150–400)
RBC: 3.91 MIL/uL — ABNORMAL LOW (ref 4.22–5.81)
RDW: 14.5 % (ref 11.5–15.5)
WBC: 6.6 10*3/uL (ref 4.0–10.5)

## 2015-09-01 LAB — BASIC METABOLIC PANEL
Anion gap: 7 (ref 5–15)
BUN: 11 mg/dL (ref 6–20)
CO2: 30 mmol/L (ref 22–32)
Calcium: 8.9 mg/dL (ref 8.9–10.3)
Chloride: 95 mmol/L — ABNORMAL LOW (ref 101–111)
Creatinine, Ser: 1.02 mg/dL (ref 0.61–1.24)
GFR calc Af Amer: >60 mL/min (ref >60)
GFR calc non Af Amer: >60 mL/min (ref >60)
Glucose, Bld: 101 mg/dL — ABNORMAL HIGH (ref 65–99)
Potassium: 4.2 mmol/L (ref 3.5–5.1)
Sodium: 132 mmol/L — ABNORMAL LOW (ref 135–145)

## 2015-09-01 LAB — HEPATIC FUNCTION PANEL
ALT: 22 U/L (ref 17–63)
AST: 31 U/L (ref 15–41)
Albumin: 4.1 g/dL (ref 3.5–5.0)
Alkaline Phosphatase: 68 U/L (ref 38–126)
Bilirubin, Direct: 0.3 mg/dL (ref 0.1–0.5)
Indirect Bilirubin: 1.2 mg/dL — ABNORMAL HIGH (ref 0.3–0.9)
Total Bilirubin: 1.5 mg/dL — ABNORMAL HIGH (ref 0.3–1.2)
Total Protein: 7.2 g/dL (ref 6.5–8.1)

## 2015-09-01 LAB — TYPE AND SCREEN
ABO/RH(D): A NEG
Antibody Screen: NEGATIVE

## 2015-09-01 LAB — PROTIME-INR
INR: 1.07 (ref 0.00–1.49)
Prothrombin Time: 14.1 seconds (ref 11.6–15.2)

## 2015-09-01 MED ORDER — OXYCODONE-ACETAMINOPHEN 5-325 MG PO TABS: 1.0000 | ORAL_TABLET | ORAL | Status: DC | PRN

## 2015-09-01 MED ORDER — HYDROCODONE-ACETAMINOPHEN 5-325 MG PO TABS
1.0000 | ORAL_TABLET | Freq: Four times a day (QID) | ORAL | Status: DC | PRN
  Administered 2015-09-02 – 2015-09-06 (×10): 2 via ORAL
  Filled 2015-09-01 (×10): qty 2

## 2015-09-01 MED ORDER — DOCUSATE SODIUM 100 MG PO CAPS
100.0000 mg | ORAL_CAPSULE | Freq: Two times a day (BID) | ORAL | Status: DC
  Administered 2015-09-02 – 2015-09-06 (×8): 100 mg via ORAL
  Filled 2015-09-01 (×8): qty 1

## 2015-09-01 MED ORDER — ADULT MULTIVITAMIN W/MINERALS CH
1.0000 | ORAL_TABLET | Freq: Every day | ORAL | Status: DC
  Administered 2015-09-03 – 2015-09-06 (×4): 1 via ORAL
  Filled 2015-09-01 (×4): qty 1

## 2015-09-01 MED ORDER — SODIUM CHLORIDE 0.9 % IV SOLN
INTRAVENOUS | Status: DC
  Administered 2015-09-01: 21:00:00 via INTRAVENOUS

## 2015-09-01 MED ORDER — MORPHINE SULFATE (PF) 2 MG/ML IV SOLN: 0.5000 mg | INTRAVENOUS | Status: DC | PRN

## 2015-09-01 MED ORDER — SODIUM CHLORIDE 0.45 % IV SOLN
INTRAVENOUS | Status: DC
  Administered 2015-09-02: 01:00:00 via INTRAVENOUS

## 2015-09-01 MED ORDER — FENTANYL CITRATE (PF) 100 MCG/2ML IJ SOLN
50.0000 ug | INTRAMUSCULAR | Status: AC | PRN
  Administered 2015-09-01 (×2): 50 ug via INTRAVENOUS
  Filled 2015-09-01 (×2): qty 2

## 2015-09-01 MED ORDER — FOLIC ACID 1 MG PO TABS
1.0000 mg | ORAL_TABLET | Freq: Every day | ORAL | Status: DC
  Administered 2015-09-03 – 2015-09-06 (×4): 1 mg via ORAL
  Filled 2015-09-01 (×4): qty 1

## 2015-09-01 MED ORDER — LORAZEPAM 2 MG/ML IJ SOLN: 0.0000 mg | Freq: Four times a day (QID) | INTRAMUSCULAR | Status: AC

## 2015-09-01 MED ORDER — TIOTROPIUM BROMIDE MONOHYDRATE 18 MCG IN CAPS
18.0000 ug | ORAL_CAPSULE | Freq: Every day | RESPIRATORY_TRACT | Status: DC
Start: 2015-09-02 — End: 2015-09-06
  Administered 2015-09-03 – 2015-09-06 (×4): 18 ug via RESPIRATORY_TRACT
  Filled 2015-09-01: qty 5

## 2015-09-01 MED ORDER — VITAMIN B-1 100 MG PO TABS
100.0000 mg | ORAL_TABLET | Freq: Every day | ORAL | Status: DC
  Administered 2015-09-03 – 2015-09-06 (×4): 100 mg via ORAL
  Filled 2015-09-01 (×4): qty 1

## 2015-09-01 MED ORDER — LORAZEPAM 2 MG/ML IJ SOLN: 0.0000 mg | Freq: Two times a day (BID) | INTRAMUSCULAR | Status: AC

## 2015-09-01 MED ORDER — THIAMINE HCL 100 MG/ML IJ SOLN: 100.0000 mg | Freq: Every day | INTRAMUSCULAR | Status: DC

## 2015-09-01 MED ORDER — LORAZEPAM 2 MG/ML IJ SOLN: 1.0000 mg | Freq: Four times a day (QID) | INTRAMUSCULAR | Status: AC | PRN

## 2015-09-01 MED ORDER — LORAZEPAM 1 MG PO TABS: 1.0000 mg | ORAL_TABLET | Freq: Four times a day (QID) | ORAL | Status: AC | PRN

## 2015-09-01 MED ORDER — POLYETHYLENE GLYCOL 3350 17 G PO PACK: 17.0000 g | PACK | Freq: Every day | ORAL | Status: DC | PRN

## 2015-09-01 NOTE — ED Notes (Signed)
Report to [place]- they will be here in 20-25 minutes

## 2015-09-01 NOTE — ED Notes (Signed)
Call to [facility] - unable to give report to RN as she is unavailable

## 2015-09-01 NOTE — ED Notes (Signed)
Per pt and spouse, he is to be transferred to [facility] per physician

## 2015-09-01 NOTE — ED Notes (Signed)
Per EMS, pt had a syncopal episode at home. States that he stood up to put something away, when he began "blacking out." Pt fell on floor on his RT side. Pt reports pain to RT wrist and RT hip. Deformity and edema noted to wrist. No deformity or rotation noted to RLE. Pt Aox4.

## 2015-09-01 NOTE — ED Provider Notes (Signed)
CSN: [id number]     Arrival date & time 09/01/15  0762 History   First MD Initiated Contact with Patient 09/01/15 1849     Chief Complaint  Patient presents with  . Fall  . Loss of Consciousness     Patient is a 75 y.o. male presenting with fall and syncope. The history is provided by the patient.  Fall This is a new problem. Pertinent negatives include no chest pain, no abdominal pain, no headaches and no shortness of breath.  Loss of Consciousness Associated symptoms: no chest pain, no headaches, no nausea, no shortness of breath, no vomiting and no weakness   Patient presents after a near syncopal episode. States he was sitting at the counter in his kitchen doing a crossword puzzle watching the news. States he stood up to get some food and became lightheaded. States he fell down. States he landed on his right wrist and right hip. States he turned so he wouldn't plan on his left hip which is had a replacement in the past. There is a deformity of the right wrist. States he gets pain in the right hip. States he did not hit his head. States he   did not even have his glasses fall off. No chest pain. Trouble breathing. His been doing well the last couple days. Good appetite. Past Medical History  Diagnosis Date  . COPD (chronic obstructive pulmonary disease) ([place])   . ETOH abuse   . Cirrhosis ([place])     confirmed by MRI on 07/04/11.  no immunizations  . Hyperlipidemia   . Depression with anxiety   . Anxiety   . Arthritis    Past Surgical History  Procedure Laterality Date  . Knee surgery      left-arthroscopy-[facility]  . Skull fracture elevation      fractured cheeck bone repaired  . Broken arm  6 yrs ago    left otif of wrist-[facility]  . External ear surgery      right ear-cleaned out ear and created new eardrum  . Wisdom tooth extraction    . Bilateral cataract surgery      [place]  . Colonoscopy  1996    [facility]: external hemorrhoids, no polyps  . Colonoscopy  06/28/2011    Dr.  Ala Bent diverticulosis,tubular adenoma, hyperplastic polyp  . Esophagogastroduodenoscopy  06/28/2011    Dr. Chelsea Aus erosive reflux esophagitis, hiatal hernia-gastritis  . Inguinal hernia repair  2-3 yrs ago    left-[facility]  . 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 Colon cancer Neg Hx    Social History  Substance Use Topics  . Smoking status: Current Every Day Smoker -- 0.50 packs/day for 60 years    Types: Cigarettes  . Smokeless tobacco: Never Used     Comment: 4-10 a day  . Alcohol Use: Yes     Comment:  3-4 beer/day    Review of Systems  Constitutional: Negative for activity change and appetite change.  Eyes: Negative for pain.  Respiratory: Negative for chest tightness and shortness of breath.   Cardiovascular: Positive for syncope. Negative for chest pain and leg swelling.  Gastrointestinal: Negative for nausea, vomiting, abdominal pain and diarrhea.  Genitourinary: Negative for flank pain.  Musculoskeletal: Positive for gait problem. Negative for back pain and neck stiffness.  Right hip pain and right wrist.   Skin: Negative for rash.  Neurological: Negative for weakness, numbness and headaches.  Psychiatric/Behavioral: Negative for behavioral problems.      Allergies  Review of patient's allergies indicates no known allergies.  Home Medications   Prior to Admission medications   Medication Sig Start Date End Date Taking? Authorizing Provider  ALPRAZolam Duanne Moron) 0.5 MG tablet Take 0.25 mg by mouth every 6 (six) hours as needed for anxiety or sleep. Reported on 08/05/2015   Yes Historical Provider, MD  Cholecalciferol (VITAMIN D PO) Take 1 capsule by mouth every morning.    Yes Historical Provider, MD  ibuprofen (ADVIL,MOTRIN) 200 MG tablet Take 200 mg by mouth every 6  (six) hours as needed for moderate pain.    Yes Historical Provider, MD  tiotropium (SPIRIVA) 18 MCG inhalation capsule Place 18 mcg into inhaler and inhale every morning.    Yes Historical Provider, MD  oxyCODONE-acetaminophen (PERCOCET/ROXICET) 5-325 MG tablet Take 1 tablet by mouth every 4 (four) hours as needed for severe pain. Patient not taking: Reported on 08/05/2015 06/17/15   Carole Civil, MD   BP 157/70 mmHg  Pulse 73  Temp(Src) 98.1 F (36.7 C) (Oral)  Resp 15  Ht '5\' 11"'$  (1.803 m)  Wt 153 lb (69.4 kg)  BMI 21.35 kg/m2  SpO2 93% Physical Exam  Constitutional: He appears well-developed.  HENT:  Head: Atraumatic.  Eyes: EOM are normal.  Neck: Neck supple.  Cardiovascular: Normal rate.   Pulmonary/Chest: Effort normal.  Abdominal: Soft.  Musculoskeletal: He exhibits tenderness.  Tenderness to right hip laterally. Some pain with rotation of the lower extremity. Neurovascular intact in the feet. Deformity to right forearm just proximal to the wrist. There is a skin tear on the dorsum of the wrist but does not appear to go all the way through the skin. Does not appear to be an open fracture. Neurovascular intact in hand.  Neurological: He is alert.  Skin: Skin is warm.  Nursing note and vitals reviewed.   ED Course  Procedures (including critical care time) Labs Review Labs Reviewed  BASIC METABOLIC PANEL - Abnormal; Notable for the following:    Sodium 132 (*)    Chloride 95 (*)    Glucose, Bld 101 (*)    All other components within normal limits  CBC WITH DIFFERENTIAL/PLATELET - Abnormal; Notable for the following:    RBC 3.91 (*)    HCT 38.1 (*)    All other components within normal limits  PROTIME-INR  TYPE AND SCREEN    Imaging Review Dg Wrist Complete Right  09/01/2015  CLINICAL DATA:  Fall in his kitchen after becoming dizzy after standing up. Right wrist pain and deformity. EXAM: RIGHT WRIST - COMPLETE 3+ VIEW COMPARISON:  None. FINDINGS: Impacted mildly  comminuted and angulated distal radius fracture with mild apex volar angulation. Fracture extends to the distal radial ulnar joint. There is likely radiocarpal extension. Nondisplaced ulna styloid fracture. Associated soft tissue edema about the wrist IMPRESSION: Comminuted angulated impacted distal radius fracture extending into the distal radial ulnar and radiocarpal joints. Nondisplaced ulna styloid fracture. Electronically Signed   By: Jeb Levering M.D.   On: 09/01/2015 19:41   Dg Chest Port 1 View  09/01/2015  CLINICAL DATA:  Right hip fracture.  Preoperative examination. EXAM: PORTABLE CHEST 1 VIEW COMPARISON:  PA and lateral chest 02/22/2009. FINDINGS: The chest is somewhat hyperexpanded but the lungs are clear. Heart size is normal. No pneumothorax or pleural  effusion. IMPRESSION: Pulmonary hyperexpansion suggestive of emphysema.  No acute disease. Electronically Signed   By: Inge Rise M.D.   On: 09/01/2015 20:27   Dg Hip Unilat With Pelvis 2-3 Views Right  09/01/2015  CLINICAL DATA:  Right hip pain after fall today. Fall after becoming dizzy and his kitchen. EXAM: DG HIP (WITH OR WITHOUT PELVIS) 2-3V RIGHT COMPARISON:  Left hip radiographs 06/11/2015 FINDINGS: Mildly displaced minimally comminuted right hip intertrochanteric fracture. Femoral head remains seated in the acetabulum. 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 Pubic symphysis and sacroiliac joints remain congruent. Left hip prosthesis partially included. Atherosclerotic calcifications are seen. IMPRESSION: Mildly displaced right hip intertrochanteric fracture. Electronically Signed   By: Jeb Levering M.D.   On: 09/01/2015 19:44   I have personally reviewed and evaluated these images and lab results as part of my medical decision-making.   EKG Interpretation   Date/Time:  Wednesday September 01 2015 19:42:09 EDT Ventricular Rate:  66 PR Interval:    QRS Duration: 121 QT Interval:  441 QTC Calculation:  463 R Axis:   90 Text Interpretation:  Sinus rhythm RBBB and LPFB Confirmed by Alvino Chapel   MD, Ovid Curd [phone]) on 09/01/2015 8:44:34 PM      MDM   Final diagnoses:  Fall, initial encounter  Distal radius fracture, right, closed, initial encounter  Fracture of ulnar styloid, right, closed, initial encounter  Closed displaced intertrochanteric fracture of right femur, initial encounter [facility])    Patient with lightheadedness and fall. Has distal radius fracture on the shower fracture and right hip fracture. Discussed with Dr. Percell Miller, will likely operate tomorrow in [place]. We'll transfer to [facility]. Discussed with Dr. Jonnie Finner for [facility] for admission.    Davonna Belling, MD 09/01/15 2138

## 2015-09-01 NOTE — ED Notes (Signed)
Call for report, RN will call right back

## 2015-09-01 NOTE — H&P (Signed)
[facility] History and Physical  NIKASH MORTENSEN IOM:[id number] DOB: 1940/03/20 DOA: 09/01/2015  Referring physician: Dr Alvino Chapel , ED PCP: Purvis Kilts, MD   Chief Complaint: Fall/ syncope, R hip and wrist fracture  HPI: Ian Aguilar is a 75 y.o. male with history of DJD and L THA in 2013, hx etoh and tobacco use daily for many years, hx cirrhosis dx'd in 2013 during w/u for enlarged liver.  Patient was standing up from bar at dinnertime today, got lightheaded and fell to the ground.  Tried to fall on his R side as he recently fell on the L side in April and fractured his left femur (periprosthetic).  Came to ED where xrays showed "Mildly displaced right hip intertrochanteric fracture." Asked to see for hosp admission.    Patient heavy etoh history, no hx of etoh withdrawal in the past.  Drinks about 4 beers per day.  No hx heart disease/ stents/ CHF or pacemaker.  No recent CP.  Had L THA in 2013.  In April this year fell and broke the L femur, healed up well w/ o surgery.    Patient denies any recent CP, SOB, cough.  He takes Spiriva for COPD and that has helped him stay away from "pneumonia".  No GI or GU complaints.       Past admit: 2011- L inguinal hernia repair April 2013 - liver enlarged, OP w/u showed +cirrhosis.   May '13 - EGD r/o varices > no varices seen, mild reflux esophagitis, gastric erosions ?sig. MRI of pancrease no mass, 2 small cysts.  No varices by EGD.  +ETOH active, cut back to 3 beers daily but hadn't stopped at this time Aug '13 - Dr Merlene Laughter ordered brain MRI for difficulty w balance, MRI showed chron ischemic changes, no CVA Oct '13 - left total hip replacement surgery April '17 - fell onto L hip, sustained L hip periprosthetic fracture; treated with WB as tolerated, f/u w ortho in 3 and 6 wks May '17 - f/u xray of fracture showed stability Jun '17 - f/u xray of fracture showed stability   ROS  denies CP  no joint pain   no HA  no blurry  vision  no rash  no diarrhea  no nausea/ vomiting  no dysuria  no difficulty voiding  no change in urine color    Past Medical History  Past Medical History  Diagnosis Date  . COPD (chronic obstructive pulmonary disease) ([place])   . ETOH abuse   . Cirrhosis ([place])     confirmed by MRI on 07/04/11.  no immunizations  . Hyperlipidemia   . Depression with anxiety   . Anxiety   . Arthritis    Past Surgical History  Past Surgical History  Procedure Laterality Date  . Knee surgery      left-arthroscopy-[place]  . Skull fracture elevation      fractured cheeck bone repaired  . Broken arm  6 yrs ago    left otif of wrist-[facility]  . External ear surgery      right ear-cleaned out ear and created new eardrum  . Wisdom tooth extraction    . Bilateral cataract surgery      [place]  . Colonoscopy  1996    [facility]: external hemorrhoids, no polyps  . Colonoscopy  06/28/2011    Dr. Ala Bent diverticulosis,tubular adenoma, hyperplastic polyp  . Esophagogastroduodenoscopy  06/28/2011    Dr. Chelsea Aus erosive reflux esophagitis, hiatal hernia-gastritis  . Inguinal hernia repair  2-3 yrs ago    left-[facility]  . 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 Colon cancer Neg Hx    Social History  reports that he has been smoking Cigarettes.  He has a 30 pack-year smoking history. He has never used smokeless tobacco. He reports that he drinks alcohol. He reports that he does not use illicit drugs. Allergies No Known Allergies Home medications Prior to Admission medications   Medication Sig Start Date End Date Taking? Authorizing Provider  ALPRAZolam Duanne Moron) 0.5 MG tablet Take 0.25 mg by mouth every 6 (six) hours as needed for anxiety or sleep. Reported on 08/05/2015   Yes Historical Provider, MD   Cholecalciferol (VITAMIN D PO) Take 1 capsule by mouth every morning.    Yes Historical Provider, MD  ibuprofen (ADVIL,MOTRIN) 200 MG tablet Take 200 mg by mouth every 6 (six) hours as needed for moderate pain.    Yes Historical Provider, MD  tiotropium (SPIRIVA) 18 MCG inhalation capsule Place 18 mcg into inhaler and inhale every morning.    Yes Historical Provider, MD  oxyCODONE-acetaminophen (PERCOCET/ROXICET) 5-325 MG tablet Take 1 tablet by mouth every 4 (four) hours as needed for severe pain. Patient not taking: Reported on 08/05/2015 06/17/15   Carole Civil, MD   Liver Function Tests No results for input(s): AST, ALT, ALKPHOS, BILITOT, PROT, ALBUMIN in the last 168 hours. No results for input(s): LIPASE, AMYLASE in the last 168 hours. CBC  Recent Labs Lab 09/01/15 2020  WBC 6.6  NEUTROABS 5.2  HGB 13.2  HCT 38.1*  MCV 97.4  PLT 856   Basic Metabolic Panel  Recent Labs Lab 09/01/15 2020  NA 132*  K 4.2  CL 95*  CO2 30  GLUCOSE 101*  BUN 11  CREATININE 1.02  CALCIUM 8.9     Filed Vitals:   09/01/15 1828 09/01/15 1830 09/01/15 1900 09/01/15 2100  BP: 148/70 156/66 162/69 157/70  Pulse: 57 56 59 73  Temp: 98.1 F (36.7 C)     TempSrc: Oral     Resp: 16   15  Height: '5\' 11"'$  (1.803 m)     Weight: 69.4 kg (153 lb)     SpO2: 92% 94% 92% 93%   Exam: Gen a bit frail and chronically ill-appearing, alert and Ox 3 No rash, cyanosis or gangrene Sclera anicteric, throat clear  No jvd or bruits Chest clear bilat, slight dec'd BS throughout, no wheezing RRR no MRG, distant HS Abd soft ntnd no mass or ascites +bs, +hepatomegaly liver down 8 cm GU normal male MS R wrist is swollen and tender w skin abrasions R arm Ext no LE edema / no LE wounds or ulcers/ dec'd ROM RLE due to hip pain Neuro is alert, Ox 3 , nf  Na 132  K 4.2  Creat 1.02  WBC 6k  Hb 13   EKG (independ reviewed) > NSR 66 bpm, RBBB + LPFB CXR (independ reviewed) > no acute  changes   Assessment: 1.  Fall/ R intertroch hip fracture/ wrist fracture - for hip repair tomorrow am per ortho at [facility]. Discussed with pt and family (son, wife), questions answered.  Not sure what plan will be for wrist fracture, defer to ortho at [facility] 2.  Etoh abuse/ cirrhosis - no hx of  cirrhosis complications (no ascites, plts normal).  Had EGD in 2013 , no varices noted 3.  COPD - takes Spiriva only, stable 4.  Anxiety on xanax prn 5.  Hx L THA, recent periprosthetic fracture 6.  Frequent falls -prob deconditioning/ etoh abuse issue   Plan - admit at [facility], watch for signs of etoh withdrawal postop. Check LFT's.  Low risk for cardiac complications, cleared for surgery medically.  CIWA protocol ordered. NPO after MN.  Start IVF's.      Sol Blazing [facility] Pager [phone]  Cell [phone]  If 7PM-7AM, please contact night-coverage [email or web address] Password [facility] 09/01/2015, 9:41 PM

## 2015-09-01 NOTE — ED Notes (Signed)
To X ray via stretcher

## 2015-09-02 ENCOUNTER — Inpatient Hospital Stay (HOSPITAL_COMMUNITY): Payer: PPO

## 2015-09-02 ENCOUNTER — Inpatient Hospital Stay (HOSPITAL_COMMUNITY): Payer: PPO | Admitting: Certified Registered"

## 2015-09-02 ENCOUNTER — Encounter (HOSPITAL_COMMUNITY)
Admission: EM | Disposition: A | Discharge status: Skilled Nursing Facility | Payer: Self-pay | Source: Home / Self Care | Attending: Internal Medicine

## 2015-09-02 DIAGNOSIS — K7030 Alcoholic cirrhosis of liver without ascites: Secondary | ICD-10-CM | POA: Diagnosis not present

## 2015-09-02 DIAGNOSIS — S52591A Other fractures of lower end of right radius, initial encounter for closed fracture: Secondary | ICD-10-CM | POA: Diagnosis not present

## 2015-09-02 DIAGNOSIS — D62 Acute posthemorrhagic anemia: Secondary | ICD-10-CM | POA: Diagnosis not present

## 2015-09-02 DIAGNOSIS — E871 Hypo-osmolality and hyponatremia: Secondary | ICD-10-CM | POA: Diagnosis not present

## 2015-09-02 DIAGNOSIS — S72001A Fracture of unspecified part of neck of right femur, initial encounter for closed fracture: Secondary | ICD-10-CM | POA: Diagnosis not present

## 2015-09-02 DIAGNOSIS — S72141A Displaced intertrochanteric fracture of right femur, initial encounter for closed fracture: Secondary | ICD-10-CM | POA: Diagnosis not present

## 2015-09-02 DIAGNOSIS — E869 Volume depletion, unspecified: Secondary | ICD-10-CM | POA: Diagnosis not present

## 2015-09-02 DIAGNOSIS — D696 Thrombocytopenia, unspecified: Secondary | ICD-10-CM | POA: Diagnosis not present

## 2015-09-02 DIAGNOSIS — F1010 Alcohol abuse, uncomplicated: Secondary | ICD-10-CM | POA: Diagnosis not present

## 2015-09-02 DIAGNOSIS — S52501A Unspecified fracture of the lower end of right radius, initial encounter for closed fracture: Secondary | ICD-10-CM

## 2015-09-02 DIAGNOSIS — S52613A Displaced fracture of unspecified ulna styloid process, initial encounter for closed fracture: Secondary | ICD-10-CM | POA: Diagnosis not present

## 2015-09-02 DIAGNOSIS — F419 Anxiety disorder, unspecified: Secondary | ICD-10-CM | POA: Diagnosis not present

## 2015-09-02 DIAGNOSIS — W19XXXA Unspecified fall, initial encounter: Secondary | ICD-10-CM | POA: Diagnosis not present

## 2015-09-02 DIAGNOSIS — F1020 Alcohol dependence, uncomplicated: Secondary | ICD-10-CM | POA: Diagnosis not present

## 2015-09-02 DIAGNOSIS — J449 Chronic obstructive pulmonary disease, unspecified: Secondary | ICD-10-CM | POA: Diagnosis not present

## 2015-09-02 HISTORY — PX: CLOSED REDUCTION WRIST FRACTURE: SHX1091

## 2015-09-02 HISTORY — PX: INTRAMEDULLARY (IM) NAIL INTERTROCHANTERIC: SHX5875

## 2015-09-02 LAB — CBC
HCT: 28.7 % — ABNORMAL LOW (ref 39.0–52.0)
Hemoglobin: 9.9 g/dL — ABNORMAL LOW (ref 13.0–17.0)
MCH: 33.9 pg (ref 26.0–34.0)
MCHC: 34.5 g/dL (ref 30.0–36.0)
MCV: 98.3 fL (ref 78.0–100.0)
Platelets: 104 10*3/uL — ABNORMAL LOW (ref 150–400)
RBC: 2.92 MIL/uL — ABNORMAL LOW (ref 4.22–5.81)
RDW: 14.5 % (ref 11.5–15.5)
WBC: 4.8 10*3/uL (ref 4.0–10.5)

## 2015-09-02 LAB — URINALYSIS, ROUTINE W REFLEX MICROSCOPIC
Bilirubin Urine: NEGATIVE
Glucose, UA: NEGATIVE mg/dL
Hgb urine dipstick: NEGATIVE
Ketones, ur: NEGATIVE mg/dL
Leukocytes, UA: NEGATIVE
Nitrite: NEGATIVE
Protein, ur: NEGATIVE mg/dL
Specific Gravity, Urine: 1.016 (ref 1.005–1.030)
pH: 6 (ref 5.0–8.0)

## 2015-09-02 LAB — CREATININE, SERUM
Creatinine, Ser: 0.83 mg/dL (ref 0.61–1.24)
GFR calc Af Amer: >60 mL/min (ref >60)
GFR calc non Af Amer: >60 mL/min (ref >60)

## 2015-09-02 LAB — MRSA PCR SCREENING: MRSA by PCR: NEGATIVE

## 2015-09-02 SURGERY — FIXATION, FRACTURE, INTERTROCHANTERIC, WITH INTRAMEDULLARY ROD
Anesthesia: Monitor Anesthesia Care | Site: Wrist | Laterality: Right

## 2015-09-02 MED ORDER — HYDROMORPHONE HCL 1 MG/ML IJ SOLN: 0.2500 mg | INTRAMUSCULAR | Status: DC | PRN

## 2015-09-02 MED ORDER — ROCURONIUM BROMIDE 50 MG/5ML IV SOLN
INTRAVENOUS | Status: AC
  Filled 2015-09-02: qty 1

## 2015-09-02 MED ORDER — HYDROMORPHONE HCL 1 MG/ML IJ SOLN
0.2500 mg | INTRAMUSCULAR | Status: DC | PRN
  Administered 2015-09-02: 0.5 mg via INTRAVENOUS

## 2015-09-02 MED ORDER — METHOCARBAMOL 500 MG PO TABS
500.0000 mg | ORAL_TABLET | Freq: Four times a day (QID) | ORAL | Status: DC | PRN
  Administered 2015-09-02 – 2015-09-06 (×5): 500 mg via ORAL
  Filled 2015-09-02 (×4): qty 1

## 2015-09-02 MED ORDER — MIDAZOLAM HCL 2 MG/2ML IJ SOLN
INTRAMUSCULAR | Status: AC
  Filled 2015-09-02: qty 2

## 2015-09-02 MED ORDER — MIDAZOLAM HCL 5 MG/5ML IJ SOLN
INTRAMUSCULAR | Status: DC | PRN
  Administered 2015-09-02: 2 mg via INTRAVENOUS

## 2015-09-02 MED ORDER — ENOXAPARIN SODIUM 40 MG/0.4ML ~~LOC~~ SOLN
40.0000 mg | SUBCUTANEOUS | Status: DC
  Administered 2015-09-03 – 2015-09-06 (×4): 40 mg via SUBCUTANEOUS
  Filled 2015-09-02 (×4): qty 0.4

## 2015-09-02 MED ORDER — OXYCODONE HCL 5 MG/5ML PO SOLN: 5.0000 mg | Freq: Once | ORAL | Status: DC | PRN

## 2015-09-02 MED ORDER — METOCLOPRAMIDE HCL 5 MG PO TABS: 5.0000 mg | ORAL_TABLET | Freq: Three times a day (TID) | ORAL | Status: DC | PRN

## 2015-09-02 MED ORDER — 0.9 % SODIUM CHLORIDE (POUR BTL) OPTIME
TOPICAL | Status: DC | PRN
  Administered 2015-09-02: 1000 mL

## 2015-09-02 MED ORDER — CEFAZOLIN IN D5W 1 GM/50ML IV SOLN
1.0000 g | Freq: Four times a day (QID) | INTRAVENOUS | Status: AC
  Administered 2015-09-02 – 2015-09-03 (×3): 1 g via INTRAVENOUS
  Filled 2015-09-02 (×3): qty 50

## 2015-09-02 MED ORDER — DEXTROSE-NACL 5-0.45 % IV SOLN: 100.0000 mL/h | INTRAVENOUS | Status: DC

## 2015-09-02 MED ORDER — FENTANYL CITRATE (PF) 250 MCG/5ML IJ SOLN
INTRAMUSCULAR | Status: AC
  Filled 2015-09-02: qty 5

## 2015-09-02 MED ORDER — ONDANSETRON HCL 4 MG PO TABS: 4.0000 mg | ORAL_TABLET | Freq: Four times a day (QID) | ORAL | Status: DC | PRN

## 2015-09-02 MED ORDER — METHOCARBAMOL 1000 MG/10ML IJ SOLN
500.0000 mg | Freq: Four times a day (QID) | INTRAVENOUS | Status: DC | PRN
  Filled 2015-09-02: qty 5

## 2015-09-02 MED ORDER — EPHEDRINE SULFATE-NACL 50-0.9 MG/10ML-% IV SOSY
PREFILLED_SYRINGE | INTRAVENOUS | Status: DC | PRN
  Administered 2015-09-02: 5 mg via INTRAVENOUS

## 2015-09-02 MED ORDER — PHENYLEPHRINE 40 MCG/ML (10ML) SYRINGE FOR IV PUSH (FOR BLOOD PRESSURE SUPPORT)
PREFILLED_SYRINGE | INTRAVENOUS | Status: AC
  Filled 2015-09-02: qty 10

## 2015-09-02 MED ORDER — CHLORHEXIDINE GLUCONATE 4 % EX LIQD: 60.0000 mL | Freq: Once | CUTANEOUS | Status: DC

## 2015-09-02 MED ORDER — ACETAMINOPHEN 325 MG PO TABS: 325.0000 mg | ORAL_TABLET | ORAL | Status: DC | PRN

## 2015-09-02 MED ORDER — PHENYLEPHRINE HCL 10 MG/ML IJ SOLN
10.0000 mg | INTRAVENOUS | Status: DC | PRN
  Administered 2015-09-02: 25 ug/min via INTRAVENOUS

## 2015-09-02 MED ORDER — BUPIVACAINE HCL (PF) 0.5 % IJ SOLN
INTRAMUSCULAR | Status: DC | PRN
  Administered 2015-09-02: 3 mL via INTRATHECAL

## 2015-09-02 MED ORDER — LACTATED RINGERS IV SOLN
INTRAVENOUS | Status: DC | PRN
  Administered 2015-09-02: 07:00:00 via INTRAVENOUS

## 2015-09-02 MED ORDER — ONDANSETRON HCL 4 MG/2ML IJ SOLN: 4.0000 mg | Freq: Four times a day (QID) | INTRAMUSCULAR | Status: DC | PRN

## 2015-09-02 MED ORDER — LIDOCAINE HCL (CARDIAC) 20 MG/ML IV SOLN
INTRAVENOUS | Status: DC | PRN
  Administered 2015-09-02: 50 mg via INTRAVENOUS

## 2015-09-02 MED ORDER — HYDROMORPHONE HCL 1 MG/ML IJ SOLN
INTRAMUSCULAR | Status: AC
  Filled 2015-09-02: qty 1

## 2015-09-02 MED ORDER — FENTANYL CITRATE (PF) 100 MCG/2ML IJ SOLN
INTRAMUSCULAR | Status: DC | PRN
  Administered 2015-09-02 (×2): 25 ug via INTRAVENOUS
  Administered 2015-09-02: 50 ug via INTRAVENOUS

## 2015-09-02 MED ORDER — ACETAMINOPHEN 160 MG/5ML PO SOLN
325.0000 mg | ORAL | Status: DC | PRN
  Filled 2015-09-02: qty 20.3

## 2015-09-02 MED ORDER — ASPIRIN EC 325 MG PO TBEC: 325.0000 mg | DELAYED_RELEASE_TABLET | Freq: Every day | ORAL | Status: DC

## 2015-09-02 MED ORDER — PROPOFOL 500 MG/50ML IV EMUL
INTRAVENOUS | Status: DC | PRN
  Administered 2015-09-02: 50 ug/kg/min via INTRAVENOUS

## 2015-09-02 MED ORDER — CEFAZOLIN SODIUM-DEXTROSE 2-4 GM/100ML-% IV SOLN
2.0000 g | INTRAVENOUS | Status: AC
  Administered 2015-09-02: 2 g via INTRAVENOUS
  Filled 2015-09-02: qty 100

## 2015-09-02 MED ORDER — PROPOFOL 10 MG/ML IV BOLUS
INTRAVENOUS | Status: DC | PRN
  Administered 2015-09-02: 50 mg via INTRAVENOUS
  Administered 2015-09-02: 30 mg via INTRAVENOUS
  Administered 2015-09-02: 20 mg via INTRAVENOUS

## 2015-09-02 MED ORDER — ACETAMINOPHEN 500 MG PO TABS: 1000.0000 mg | ORAL_TABLET | Freq: Once | ORAL | Status: DC

## 2015-09-02 MED ORDER — ACETAMINOPHEN 650 MG RE SUPP: 650.0000 mg | Freq: Four times a day (QID) | RECTAL | Status: DC | PRN

## 2015-09-02 MED ORDER — ACETAMINOPHEN 325 MG PO TABS
650.0000 mg | ORAL_TABLET | Freq: Four times a day (QID) | ORAL | Status: DC | PRN
  Administered 2015-09-03: 650 mg via ORAL
  Filled 2015-09-02: qty 2

## 2015-09-02 MED ORDER — METHOCARBAMOL 500 MG PO TABS
ORAL_TABLET | ORAL | Status: AC
  Filled 2015-09-02: qty 1

## 2015-09-02 MED ORDER — PROPOFOL 10 MG/ML IV BOLUS
INTRAVENOUS | Status: AC
  Filled 2015-09-02: qty 20

## 2015-09-02 MED ORDER — DIPHENHYDRAMINE HCL 12.5 MG/5ML PO ELIX: 12.5000 mg | ORAL_SOLUTION | ORAL | Status: DC | PRN

## 2015-09-02 MED ORDER — OXYCODONE HCL 5 MG PO TABS: 5.0000 mg | ORAL_TABLET | Freq: Once | ORAL | Status: DC | PRN

## 2015-09-02 MED ORDER — ONDANSETRON HCL 4 MG PO TABS: 4.0000 mg | ORAL_TABLET | Freq: Three times a day (TID) | ORAL | Status: DC | PRN

## 2015-09-02 MED ORDER — HYDROCODONE-ACETAMINOPHEN 5-325 MG PO TABS: 1.0000 | ORAL_TABLET | ORAL | Status: DC | PRN

## 2015-09-02 MED ORDER — METOCLOPRAMIDE HCL 5 MG/ML IJ SOLN: 5.0000 mg | Freq: Three times a day (TID) | INTRAMUSCULAR | Status: DC | PRN

## 2015-09-02 MED ORDER — SODIUM CHLORIDE 0.9 % IV SOLN
INTRAVENOUS | Status: DC
  Administered 2015-09-02 – 2015-09-06 (×5): via INTRAVENOUS

## 2015-09-02 MED ORDER — POVIDONE-IODINE 10 % EX SWAB: 2.0000 "application " | Freq: Once | CUTANEOUS | Status: DC

## 2015-09-02 SURGICAL SUPPLY — 50 items
BANDAGE ELASTIC 4 VELCRO ST LF (GAUZE/BANDAGES/DRESSINGS) ×6 IMPLANT
BNDG COHESIVE 4X5 TAN STRL (GAUZE/BANDAGES/DRESSINGS) ×3 IMPLANT
BNDG GAUZE ELAST 4 BULKY (GAUZE/BANDAGES/DRESSINGS) ×3 IMPLANT
BNDG PLASTER X FAST 4X5 WHT LF (CAST SUPPLIES) ×9 IMPLANT
CLSR STERI-STRIP ANTIMIC 1/2X4 (GAUZE/BANDAGES/DRESSINGS) ×3 IMPLANT
COVER PERINEAL POST (MISCELLANEOUS) ×3 IMPLANT
COVER SURGICAL LIGHT HANDLE (MISCELLANEOUS) ×3 IMPLANT
DRAPE STERI IOBAN 125X83 (DRAPES) ×3 IMPLANT
DRSG EMULSION OIL 3X3 NADH (GAUZE/BANDAGES/DRESSINGS) ×3 IMPLANT
DRSG MEPILEX BORDER 4X4 (GAUZE/BANDAGES/DRESSINGS) ×6 IMPLANT
DURAPREP 26ML APPLICATOR (WOUND CARE) ×3 IMPLANT
ELECT REM PT RETURN 9FT ADLT (ELECTROSURGICAL) ×3
ELECTRODE REM PT RTRN 9FT ADLT (ELECTROSURGICAL) ×2 IMPLANT
GLOVE BIO SURGEON STRL SZ7 (GLOVE) IMPLANT
GLOVE BIO SURGEON STRL SZ7.5 (GLOVE) ×6 IMPLANT
GLOVE BIOGEL PI IND STRL 7.0 (GLOVE) ×2 IMPLANT
GLOVE BIOGEL PI IND STRL 7.5 (GLOVE) ×2 IMPLANT
GLOVE BIOGEL PI IND STRL 8 (GLOVE) ×4 IMPLANT
GLOVE BIOGEL PI INDICATOR 7.0 (GLOVE) ×1
GLOVE BIOGEL PI INDICATOR 7.5 (GLOVE) ×1
GLOVE BIOGEL PI INDICATOR 8 (GLOVE) ×2
GLOVE SURG SS PI 6.5 STRL IVOR (GLOVE) ×3 IMPLANT
GLOVE SURG SS PI 7.5 STRL IVOR (GLOVE) ×3 IMPLANT
GOWN STRL REUS W/ TWL LRG LVL3 (GOWN DISPOSABLE) ×4 IMPLANT
GOWN STRL REUS W/ TWL XL LVL3 (GOWN DISPOSABLE) ×4 IMPLANT
GOWN STRL REUS W/TWL LRG LVL3 (GOWN DISPOSABLE) ×2
GOWN STRL REUS W/TWL XL LVL3 (GOWN DISPOSABLE) ×2
GUIDEROD T2 3X1000 (ROD) ×3 IMPLANT
K-WIRE  3.2X450M STR (WIRE) ×1
K-WIRE 3.2X450M STR (WIRE) ×2
KIT ROOM TURNOVER OR (KITS) ×3 IMPLANT
KWIRE 3.2X450M STR (WIRE) ×2 IMPLANT
MANIFOLD NEPTUNE II (INSTRUMENTS) ×3 IMPLANT
NAIL LONG GAMMA3 10X420X125 (Nail) ×3 IMPLANT
NS IRRIG 1000ML POUR BTL (IV SOLUTION) ×3 IMPLANT
PACK GENERAL/GYN (CUSTOM PROCEDURE TRAY) ×3 IMPLANT
PAD ARMBOARD 7.5X6 YLW CONV (MISCELLANEOUS) ×3 IMPLANT
PAD CAST 4YDX4 CTTN HI CHSV (CAST SUPPLIES) ×2 IMPLANT
PADDING CAST ABS 4INX4YD NS (CAST SUPPLIES) ×3
PADDING CAST ABS COTTON 4X4 ST (CAST SUPPLIES) ×6 IMPLANT
PADDING CAST COTTON 4X4 STRL (CAST SUPPLIES) ×1
SCREW LAG GAMMA 3 TI 10.5X100M (Screw) ×3 IMPLANT
SPONGE GAUZE 4X4 12PLY STER LF (GAUZE/BANDAGES/DRESSINGS) ×3 IMPLANT
SUT MNCRL AB 4-0 PS2 18 (SUTURE) ×3 IMPLANT
SUT MON AB 2-0 CT1 27 (SUTURE) ×3 IMPLANT
SUT VIC AB 0 CT1 27 (SUTURE) ×1
SUT VIC AB 0 CT1 27XBRD ANBCTR (SUTURE) ×2 IMPLANT
SYR BULB IRRIGATION 50ML (SYRINGE) ×3 IMPLANT
TOWEL OR 17X24 6PK STRL BLUE (TOWEL DISPOSABLE) ×3 IMPLANT
TOWEL OR 17X26 10 PK STRL BLUE (TOWEL DISPOSABLE) ×3 IMPLANT

## 2015-09-02 NOTE — Anesthesia Preprocedure Evaluation (Addendum)
Anesthesia Evaluation  Patient identified by MRN, date of birth, ID band Patient awake    Reviewed: Allergy & Precautions, NPO status , Patient's Chart, lab work & pertinent test results  History of Anesthesia Complications Negative for: history of anesthetic complications  Airway Mallampati: II  TM Distance: >3 FB Neck ROM: Full    Dental  (+) Dental Advisory Given   Pulmonary COPD,  COPD inhaler, Current Smoker,    breath sounds clear to auscultation       Cardiovascular negative cardio ROS   Rhythm:Regular     Neuro/Psych PSYCHIATRIC DISORDERS Anxiety    GI/Hepatic negative GI ROS, (+)     substance abuse  alcohol use,   Endo/Other  negative endocrine ROS  Renal/GU negative Renal ROS     Musculoskeletal  (+) Arthritis ,   Abdominal   Peds  Hematology negative hematology ROS (+) anemia ,   Anesthesia Other Findings   Reproductive/Obstetrics negative OB ROS                            Anesthesia Physical Anesthesia Plan  ASA: III  Anesthesia Plan: Spinal and MAC   Post-op Pain Management:    Induction:   Airway Management Planned: Natural Airway, Nasal Cannula and Simple Face Mask  Additional Equipment: None  Intra-op Plan:   Post-operative Plan:   Informed Consent: I have reviewed the patients History and Physical, chart, labs and discussed the procedure including the risks, benefits and alternatives for the proposed anesthesia with the patient or authorized representative who has indicated his/her understanding and acceptance.   Dental advisory given  Plan Discussed with: CRNA and Surgeon  Anesthesia Plan Comments:         Anesthesia Quick Evaluation

## 2015-09-02 NOTE — Transfer of Care (Signed)
Immediate Anesthesia Transfer of Care Note  Patient: Ian Aguilar  Procedure(s) Performed: Procedure(s) with comments: RIGHT  INTERTROCHANTRIC HIP (Right) - With MAC RIGHT WRIST REDUCTION (Right) - with MAC  Patient Location: PACU  Anesthesia Type:MAC  Level of Consciousness: sedated  Airway & Oxygen Therapy: Patient connected to face mask oxygen  Post-op Assessment: Report given to RN  Post vital signs: stable  Last Vitals:  Filed Vitals:   09/02/15 0005 09/02/15 0506  BP: 167/64 151/60  Pulse: 81 79  Temp: 36.8 C 37.4 C  Resp: 17 17    Last Pain:  Filed Vitals:   09/02/15 0925  PainSc: 2          Complications: No apparent anesthesia complications

## 2015-09-02 NOTE — Interval H&P Note (Signed)
History and Physical Interval Note:  09/02/2015 7:21 AM  Ian Aguilar  has presented today for surgery, with the diagnosis of right hip fracture  The various methods of treatment have been discussed with the patient and family. After consideration of risks, benefits and other options for treatment, the patient has consented to  Procedure(s): RIGHT  INTERTROCHANTRIC HIP (Right) as a surgical intervention .  The patient's history has been reviewed, patient examined, no change in status, stable for surgery.  I have reviewed the patient's chart and labs.  Questions were answered to the patient's satisfaction.     Clem Wisenbaker D

## 2015-09-02 NOTE — Progress Notes (Signed)
Care of pt assumed by MA Glora Hulgan RN 

## 2015-09-02 NOTE — Consult Note (Signed)
ORTHOPAEDIC CONSULTATION  REQUESTING PHYSICIAN: Roney Jaffe, MD  Chief Complaint: Right hip pain/PE fall   Assessment: Principal Problem:   Fracture of right hip requiring operative repair [facility]) Active Problems:   Cirrhosis- imaging diagnosis 2013   S/P hip replacement   Alcohol abuse   Tobacco abuse   COPD (chronic obstructive pulmonary disease)   Distal radius fracture, right  Mildly displaced right hip intertrochanteric fracture. Comminuted impacted right distal radius fracture.  Plan: Right intertrochanteric IM nail by Dr. [name] today.  The risks benefits and alternatives were discussed with the patient including but not limited to the risks of nonoperative treatment, versus surgical intervention including infection, bleeding, nerve injury, periprosthetic fracture, the need for revision surgery, dislocation, leg length discrepancy, blood clots, cardiopulmonary complications, morbidity, mortality, among others, and they were willing to proceed.   Weight Bearing Status: NWB, plan for WBAT post op. VTE px: per primary.  HPI: Ian Aguilar is a 75 y.o. male with a history of DJD and left THA in 2013. Subsequent fall and periprosthetic fracture in April managed nonoperatively. He also has history of COPD, tobacco abuse, and cirrhosis secondary to alcohol abuse.  He presents with right hip and wrist pain after fall onto right side while at home. He was standing at his bar and felt lightheaded. Denies hitting head. No loss of consciousness. No current chest pain or shortness of breath.  He had been eating, drinking, and voiding normally. Last meal lunchtime yesterday.  Past Medical History  Diagnosis Date  . COPD (chronic obstructive pulmonary disease) ([place])   . ETOH abuse   . Cirrhosis ([place])     confirmed by MRI on 07/04/11.  no immunizations  . Hyperlipidemia   . Depression with anxiety   . Anxiety   . Arthritis    Past Surgical History  Procedure  Laterality Date  . Knee surgery      left-arthroscopy-[place]  . Skull fracture elevation      fractured cheeck bone repaired  . Broken arm  6 yrs ago    left otif of wrist-[place]  . External ear surgery      right ear-cleaned out ear and created new eardrum  . Wisdom tooth extraction    . Bilateral cataract surgery      [place]  . Colonoscopy  1996    [place]: external hemorrhoids, no polyps  . Colonoscopy  06/28/2011    Dr. Ala Bent diverticulosis,tubular adenoma, hyperplastic polyp  . Esophagogastroduodenoscopy  06/28/2011    Dr. Chelsea Aus erosive reflux esophagitis, hiatal hernia-gastritis  . Inguinal hernia repair  2-3 yrs ago    left-[place]_  . Total hip arthroplasty  12/18/2011    Procedure: TOTAL HIP ARTHROPLASTY;  Surgeon: Carole Civil, MD;  Location: [facility];  Service: Orthopedics;  Laterality: Left;   Social History   Social History  . Marital Status: Married    Spouse Name: N/A  . Number of Children: N/A  . Years of Education: N/A   Occupational History  . retired    Social History Main Topics  . Smoking status: Current Every Day Smoker -- 0.50 packs/day for 60 years    Types: Cigarettes  . Smokeless tobacco: Never Used     Comment: 4-10 a day  . Alcohol Use: Yes     Comment:  3-4 beer/day  . Drug Use: No  . Sexual Activity: Yes    Birth Control/ Protection: None   Other Topics Concern  . None  Social History Narrative   Family History  Problem Relation Age of Onset  . Colon cancer Neg Hx    No Known Allergies Prior to Admission medications   Medication Sig Start Date End Date Taking? Authorizing Provider  ALPRAZolam Duanne Moron) 0.5 MG tablet Take 0.25 mg by mouth every 6 (six) hours as needed for anxiety or sleep. Reported on 08/05/2015   Yes Historical Provider, MD  Cholecalciferol (VITAMIN D PO) Take 1 capsule by mouth every morning.    Yes Historical Provider, MD  ibuprofen (ADVIL,MOTRIN) 200 MG tablet Take 200 mg by mouth every 6  (six) hours as needed for moderate pain.    Yes Historical Provider, MD  tiotropium (SPIRIVA) 18 MCG inhalation capsule Place 18 mcg into inhaler and inhale every morning.    Yes Historical Provider, MD  oxyCODONE-acetaminophen (PERCOCET/ROXICET) 5-325 MG tablet Take 1 tablet by mouth every 4 (four) hours as needed for severe pain. Patient not taking: Reported on 08/05/2015 06/17/15   Carole Civil, MD   Dg Wrist Complete Right  09/01/2015  CLINICAL DATA:  Fall in his kitchen after becoming dizzy after standing up. Right wrist pain and deformity. EXAM: RIGHT WRIST - COMPLETE 3+ VIEW COMPARISON:  None. FINDINGS: Impacted mildly comminuted and angulated distal radius fracture with mild apex volar angulation. Fracture extends to the distal radial ulnar joint. There is likely radiocarpal extension. Nondisplaced ulna styloid fracture. Associated soft tissue edema about the wrist IMPRESSION: Comminuted angulated impacted distal radius fracture extending into the distal radial ulnar and radiocarpal joints. Nondisplaced ulna styloid fracture. Electronically Signed   By: Jeb Levering M.D.   On: 09/01/2015 19:41   Dg Chest Port 1 View  09/01/2015  CLINICAL DATA:  Right hip fracture.  Preoperative examination. EXAM: PORTABLE CHEST 1 VIEW COMPARISON:  PA and lateral chest 02/22/2009. FINDINGS: The chest is somewhat hyperexpanded but the lungs are clear. Heart size is normal. No pneumothorax or pleural effusion. IMPRESSION: Pulmonary hyperexpansion suggestive of emphysema.  No acute disease. Electronically Signed   By: Inge Rise M.D.   On: 09/01/2015 20:27   Dg Hip Unilat With Pelvis 2-3 Views Right  09/01/2015  CLINICAL DATA:  Right hip pain after fall today. Fall after becoming dizzy and his kitchen. EXAM: DG HIP (WITH OR WITHOUT PELVIS) 2-3V RIGHT COMPARISON:  Left hip radiographs 06/11/2015 FINDINGS: Mildly displaced minimally comminuted right hip intertrochanteric fracture. Femoral head remains seated  in the acetabulum. 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 Pubic symphysis and sacroiliac joints remain congruent. Left hip prosthesis partially included. Atherosclerotic calcifications are seen. IMPRESSION: Mildly displaced right hip intertrochanteric fracture. Electronically Signed   By: Jeb Levering M.D.   On: 09/01/2015 19:44    ROS: All other systems have been reviewed and were otherwise negative with the exception of those mentioned in the HPI and as above.  Objective: Labs cbc  Recent Labs  09/01/15 2020  WBC 6.6  HGB 13.2  HCT 38.1*  PLT 154    Recent Labs  09/01/15 2020  INR 1.07     Recent Labs  09/01/15 2020  NA 132*  K 4.2  CL 95*  CO2 30  GLUCOSE 101*  BUN 11  CREATININE 1.02  CALCIUM 8.9    Physical Exam: Filed Vitals:   09/02/15 0005 09/02/15 0506  BP: 167/64 151/60  Pulse: 81 79  Temp: 98.3 F (36.8 C) 99.3 F (37.4 C)  Resp: 17 17   General: Alert, no acute distress. Supine in  bed on arrival. Cardiovascular: RRR. No pedal edema Respiratory: CTA without crackles or wheeze. No cyanosis, no use of accessory musculature GI: No organomegaly, abdomen is soft and non-tender Skin: No lesions over her hip. Open skin tear distal dorsal aspect of right arm. Tegaderm in place. Neurologic: Sensation intact distally. Psychiatric: Patient is competent for consent with normal mood and affect  MUSCULOSKELETAL:  Right lateral hip mildly tender to palpation. Pain with movement. Externally rotated. Neurovascularly intact. Distal sensation intact. Right wrist with skin abrasion noted above. Marked ecchymosis and moderate swelling of the right wrist. Strength and movement of an limited by pain. Distal sensation intact. Neurovascularly intact. Other extremities are atraumatic with painless ROM and NVI.  Prudencio Burly III PA-C 09/02/2015 6:03 AM

## 2015-09-02 NOTE — Evaluation (Signed)
Physical Therapy Evaluation Patient Details Name: Ian Aguilar MRN: [id number] DOB: February 15, 1941 Today's Date: 09/02/2015   History of Present Illness  75 y.o. male with a fall and subsequent Rt intertrochanteric hip fracture and Rt wrist fx which occured following a fall at home. Pt is now s/p RIGHT INTERTROCHANTRIC HIP, RIGHT WRIST REDUCTION. The pt had a previous fall with Lt periprosthetic fracture in April managed nonoperatively PMH: left THA 2013 , COPD, tobacco abuse, and cirrhosis secondary to alcohol abuse, anxiety, depression.   Clinical Impression  Pt able to sit EOB at this time, declining attempts to stand. Mod assist needed for bed mobility during initial session. Currently the patient states that he is unsure of where he is going following his stay in the hospital. He does have assistance at home (spouse) per his report but he has a 3 level home with multiple stairs. PT to progress mobility and independence in anticipation of D/C to home. Recommendations for D/C may need to be modified depending on how the patient progresses with ambulation/stairs.   Follow Up Recommendations Home health PT;Supervision for mobility/OOB    Equipment Recommendations  3in1 (PT)    Recommendations for Other Services OT consult     Precautions / Restrictions Precautions Precautions: Fall Restrictions Weight Bearing Restrictions: Yes RLE Weight Bearing: Weight bearing as tolerated      Mobility  Bed Mobility Overal bed mobility: Needs Assistance Bed Mobility: Supine to Sit;Sit to Supine     Supine to sit: Mod assist;HOB elevated (assist with LEs and trunk) Sit to supine: Mod assist (assist with trunk and Rt LE)   General bed mobility comments: Pt not using Rt UE for mobility. Pt sitting EOB approx. 5 minutes before requesting return to supine.  Transfers                    Ambulation/Gait                Stairs            Wheelchair Mobility    Modified  Rankin (Stroke Patients Only)       Balance Overall balance assessment: Needs assistance Sitting-balance support: No upper extremity supported Sitting balance-Leahy Scale: Fair Sitting balance - Comments: mild instability in sitting but no loss of balance.                                      Pertinent Vitals/Pain Pain Assessment: Faces Faces Pain Scale: Hurts even more Pain Location: Rt hip Pain Descriptors / Indicators: Guarding;Grimacing Pain Intervention(s): Limited activity within patient's tolerance;Monitored during session;Repositioned;Premedicated before session    Home Living Family/patient expects to be discharged to:: Unsure Living Arrangements: Spouse/significant other Available Help at Discharge: Available 24 hours/day;Family Type of Home: House Home Access: Stairs to enter Entrance Stairs-Rails: Left Entrance Stairs-Number of Steps: 2 Home Layout: Multi-level Home Equipment: Walker - 2 wheels;Wheelchair - manual;Cane - single point Additional Comments: Pt states that the is not sure yet if he will be able to go home of if he needs to go somewhere else for more therapy before going home.     Prior Function Level of Independence: Independent with assistive device(s)         Comments: used SPC with community ambulation     Hand Dominance        Extremity/Trunk Assessment   Upper Extremity Assessment: RUE deficits/detail RUE Deficits /  Details: in splint but pt able to raise arm without assist           RLE Deficits / Details: needs assist to move Rt LE for bed mobility       Communication   Communication: No difficulties  Cognition Arousal/Alertness: Lethargic Behavior During Therapy: WFL for tasks assessed/performed Overall Cognitive Status: Within Functional Limits for tasks assessed                      General Comments      Exercises        Assessment/Plan    PT Assessment Patient needs continued PT  services  PT Diagnosis Difficulty walking;Acute pain   PT Problem List Decreased strength;Decreased range of motion;Decreased activity tolerance;Decreased balance;Decreased mobility  PT Treatment Interventions DME instruction;Gait training;Stair training;Functional mobility training;Therapeutic activities;Therapeutic exercise;Patient/family education   PT Goals (Current goals can be found in the Care Plan section) Acute Rehab PT Goals Patient Stated Goal: move without pain PT Goal Formulation: With patient Time For Goal Achievement: 09/16/15 Potential to Achieve Goals: Good    Frequency Min 5X/week   Barriers to discharge        Co-evaluation               End of Session   Activity Tolerance: Patient tolerated treatment well Patient left: in bed;with call bell/phone within reach;with bed alarm set;with SCD's reapplied (pt asleep after session) Nurse Communication: Mobility status;Weight bearing status         Time: 5009-3818 PT Time Calculation (min) (ACUTE ONLY): 20 min   Charges:   PT Evaluation $PT Eval Moderate Complexity: 1 Procedure     PT G Codes:        Cassell Clement, PT, CSCS Pager [phone] Office [phone]  09/02/2015, 4:02 PM

## 2015-09-02 NOTE — Progress Notes (Signed)
PROGRESS NOTE                                                                                                                                                                                                             Patient Demographics:    Ian Aguilar, is a 75 y.o. male, DOB - 11-05-1940, HYQ:[id number]  Admit date - 09/01/2015   Admitting Physician Roney Jaffe, MD  Outpatient Primary MD for the patient is Purvis Kilts, MD  LOS - 1  Chief Complaint  Patient presents with  . Fall  . Loss of Consciousness       Brief Narrative   76 y.o. male with history of DJD and L THA in 2013, hx etoh and tobacco use daily for many years, hx cirrhosis dx'd in 2013 during w/u for enlarged liver, Presents with fall secondary to syncope, sustained right hip and wrist fracture, status post surgical repair of right hip fracture by Dr. Percell Miller on 7/6   Subjective:    Corrie Dandy today has, No headache, No chest pain, No abdominal pain .   Assessment  & Plan :    Principal Problem:   Fracture of right hip requiring operative repair [facility]) Active Problems:   Cirrhosis- imaging diagnosis 2013   S/P hip replacement   Alcohol abuse   Tobacco abuse   COPD (chronic obstructive pulmonary disease)   Distal radius fracture, right  Right hip/right wrist fracture - Management per orthopedic, status post RIGHT INTERTROCHANTRIC HIP, RIGHT WRIST REDUCTION by Dr. Percell Miller on 7/6 - On when necessary pain and nausea medication - PT to see in a.m. - DVT prophylaxis per orthopedic  Syncope - Agent reports lightheadedness upon sudden standing caused him to lose consciousness - Continue with IV fluids, continue to monitor on telemetry, will check urinalysis  Alcohol abuse - Cont with CIWA protocol  COPD - No active wheezing, continue with Spiriva, continue with when necessary nebs      Code Status : Full  Family Communication  : None at  bedside  Disposition Plan  : Pending PT evaluation  Consults  :  Ortho  Procedures  : RIGHT INTERTROCHANTRIC HIP, RIGHT WRIST REDUCTION by Dr. Percell Miller on 7/6  DVT Prophylaxis  : lovenox- SCDs   Lab Results  Component Value Date   PLT 154 09/01/2015  Antibiotics  :    Anti-infectives    Start     Dose/Rate Route Frequency Ordered Stop   09/02/15 1400  ceFAZolin (ANCEF) IVPB 1 g/50 mL premix     1 g 100 mL/hr over 30 Minutes Intravenous Every 6 hours 09/02/15 1244 09/03/15 0759   09/02/15 0630  ceFAZolin (ANCEF) IVPB 2g/100 mL premix     2 g 200 mL/hr over 30 Minutes Intravenous On call to O.R. 09/02/15 0625 09/02/15 0822        Objective:   Filed Vitals:   09/02/15 1137 09/02/15 1145 09/02/15 1207 09/02/15 1211  BP: 126/56  128/57   Pulse:  77  73  Temp:    98.1 F (36.7 C)  TempSrc:      Resp:  18  20  Height:      Weight:      SpO2:  98%  99%    Wt Readings from Last 3 Encounters:  09/01/15 69.4 kg (153 lb)  08/05/15 67.949 kg (149 lb 12.8 oz)  07/08/15 68.493 kg (151 lb)     Intake/Output Summary (Last 24 hours) at 09/02/15 1259 Last data filed at 09/02/15 1232  Gross per 24 hour  Intake    310 ml  Output    200 ml  Net    110 ml     Physical Exam  Awake Alert, Oriented X 3, [place].AT,PERRAL Supple Neck,No JVD, No cervical lymphadenopathy appriciated.  Symmetrical Chest wall movement, Good air movement bilaterally, CTAB RRR,No Gallops,Rubs or new Murmurs, No Parasternal Heave +ve B.Sounds, Abd Soft, No tenderness, No organomegaly appriciated, No rebound - guarding or rigidity. No Cyanosis, Clubbing or edema,Right arm bandaged    Data Review:    CBC  Recent Labs Lab 09/01/15 2020  WBC 6.6  HGB 13.2  HCT 38.1*  PLT 154  MCV 97.4  MCH 33.8  MCHC 34.6  RDW 14.5  LYMPHSABS 0.9  MONOABS 0.4  EOSABS 0.1  BASOSABS 0.0    Chemistries   Recent Labs Lab 09/01/15 2020  NA 132*  K 4.2  CL 95*  CO2 30  GLUCOSE 101*  BUN 11   CREATININE 1.02  CALCIUM 8.9  AST 31  ALT 22  ALKPHOS 68  BILITOT 1.5*   ------------------------------------------------------------------------------------------------------------------ No results for input(s): CHOL, HDL, LDLCALC, TRIG, CHOLHDL, LDLDIRECT in the last 72 hours.  No results found for: HGBA1C ------------------------------------------------------------------------------------------------------------------ No results for input(s): TSH, T4TOTAL, T3FREE, THYROIDAB in the last 72 hours.  Invalid input(s): FREET3 ------------------------------------------------------------------------------------------------------------------ No results for input(s): VITAMINB12, FOLATE, FERRITIN, TIBC, IRON, RETICCTPCT in the last 72 hours.  Coagulation profile  Recent Labs Lab 09/01/15 2020  INR 1.07    No results for input(s): DDIMER in the last 72 hours.  Cardiac Enzymes No results for input(s): CKMB, TROPONINI, MYOGLOBIN in the last 168 hours.  Invalid input(s): CK ------------------------------------------------------------------------------------------------------------------ No results found for: BNP  Inpatient Medications  Scheduled Meds: .  ceFAZolin (ANCEF) IV  1 g Intravenous Q6H  . docusate sodium  100 mg Oral BID  . [START ON 09/03/2015] enoxaparin (LOVENOX) injection  40 mg Subcutaneous Q24H  . folic acid  1 mg Oral Daily  . HYDROmorphone      . LORazepam  0-4 mg Intravenous Q6H   Followed by  . [START ON 09/04/2015] LORazepam  0-4 mg Intravenous Q12H  . methocarbamol      . multivitamin with minerals  1 tablet Oral Daily  . thiamine  100 mg Oral Daily   Or  .  thiamine  100 mg Intravenous Daily  . tiotropium  18 mcg Inhalation Daily   Continuous Infusions: . sodium chloride 75 mL/hr at 09/02/15 0044   PRN Meds:.acetaminophen **OR** acetaminophen, diphenhydrAMINE, HYDROcodone-acetaminophen, LORazepam **OR** LORazepam, methocarbamol **OR** methocarbamol  (ROBAXIN)  IV, metoCLOPramide **OR** metoCLOPramide (REGLAN) injection, morphine injection, ondansetron **OR** ondansetron (ZOFRAN) IV, oxyCODONE-acetaminophen, polyethylene glycol  Micro Results Recent Results (from the past 240 hour(s))  MRSA PCR Screening     Status: None   Collection Time: 09/02/15 12:49 AM  Result Value Ref Range Status   MRSA by PCR NEGATIVE NEGATIVE Final    Comment:        The GeneXpert MRSA Assay (FDA approved for NASAL specimens only), is one component of a comprehensive MRSA colonization surveillance program. It is not intended to diagnose MRSA infection nor to guide or monitor treatment for MRSA infections.     Radiology Reports Wrist 2 Views Right  09/02/2015  CLINICAL DATA:  Distal radial fracture, in plaster views EXAM: RIGHT WRIST - 2 VIEW COMPARISON:  Pre reduction images dated September 01, 2015 FINDINGS: The patient has undergone close reduction of the distal radial metaphyseal fracture. There remains mild angulation and impaction. The fracture line reaches the articular surface of the distal radius. The adjacent ulna is intact. IMPRESSION: Stable positioning of the comminuted angulated impacted fracture of the distal right radial metaphysis. The radiocarpal joint is involved. Electronically Signed   By: David  Martinique M.D.   On: 09/02/2015 10:52   Dg Wrist Complete Right  09/01/2015  CLINICAL DATA:  Fall in his kitchen after becoming dizzy after standing up. Right wrist pain and deformity. EXAM: RIGHT WRIST - COMPLETE 3+ VIEW COMPARISON:  None. FINDINGS: Impacted mildly comminuted and angulated distal radius fracture with mild apex volar angulation. Fracture extends to the distal radial ulnar joint. There is likely radiocarpal extension. Nondisplaced ulna styloid fracture. Associated soft tissue edema about the wrist IMPRESSION: Comminuted angulated impacted distal radius fracture extending into the distal radial ulnar and radiocarpal joints. Nondisplaced ulna  styloid fracture. Electronically Signed   By: Jeb Levering M.D.   On: 09/01/2015 19:41   Dg Chest Port 1 View  09/01/2015  CLINICAL DATA:  Right hip fracture.  Preoperative examination. EXAM: PORTABLE CHEST 1 VIEW COMPARISON:  PA and lateral chest 02/22/2009. FINDINGS: The chest is somewhat hyperexpanded but the lungs are clear. Heart size is normal. No pneumothorax or pleural effusion. IMPRESSION: Pulmonary hyperexpansion suggestive of emphysema.  No acute disease. Electronically Signed   By: Inge Rise M.D.   On: 09/01/2015 20:27   Dg Hip Operative Unilat W Or W/o Pelvis Right  09/02/2015  CLINICAL DATA:  ORIF. EXAM: OPERATIVE RIGHT HIP (WITH PELVIS IF PERFORMED) 3 VIEWS TECHNIQUE: Fluoroscopic spot image(s) were submitted for interpretation post-operatively. COMPARISON:  09/01/2015. FINDINGS: ORIF right intertrochanteric hip fracture. Good anatomic alignment. Hardware intact. Peripheral vascular calcification. IMPRESSION: 1.  ORIF right hip fracture good anatomic alignment. 2. Peripheral vascular disease . Electronically Signed   By: Marcello Moores  Register   On: 09/02/2015 08:51   Dg Hip Unilat With Pelvis 2-3 Views Left  08/05/2015  Radiology report Chief complaint x-ray evaluation of total hip prosthesis Postoperative x-rays status post LEFT  Total hip arthroplasty Findings:  A total hip prosthesis is noted. The femoral prosthesis does not show any loosening and is in acceptable alignment. The acetabular cup has appropriate abduction angle. There is no radiolucencies around the cup. There is no soft tissue calcification in the area of the abductor  musculature Impression: Normal appearance of this total hip prosthesis   Dg Hip Unilat With Pelvis 2-3 Views Right  09/01/2015  CLINICAL DATA:  Right hip pain after fall today. Fall after becoming dizzy and his kitchen. EXAM: DG HIP (WITH OR WITHOUT PELVIS) 2-3V RIGHT COMPARISON:  Left hip radiographs 06/11/2015 FINDINGS: Mildly displaced minimally  comminuted right hip intertrochanteric fracture. Femoral head remains seated in the acetabulum. 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 Pubic symphysis and sacroiliac joints remain congruent. Left hip prosthesis partially included. Atherosclerotic calcifications are seen. IMPRESSION: Mildly displaced right hip intertrochanteric fracture. Electronically Signed   By: Jeb Levering M.D.   On: 09/01/2015 19:44    Time Spent in minutes  25 minutes   Jailey Booton M.D on 09/02/2015 at 12:59 PM  Between 7am to 7pm - Pager - [phone]  After 7pm go to [email or web address] - password [facility]  [facility] -  Office  [phone]

## 2015-09-02 NOTE — Progress Notes (Signed)
Report given to maryann rn as caregiver 

## 2015-09-02 NOTE — Anesthesia Procedure Notes (Signed)
Spinal Patient location during procedure: OR Staffing Anesthesiologist: Madelon Welsch Preanesthetic Checklist Completed: patient identified, surgical consent, pre-op evaluation, timeout performed, IV checked, risks and benefits discussed and monitors and equipment checked Spinal Block Patient position: right lateral decubitus Prep: site prepped and draped and DuraPrep Patient monitoring: heart rate, cardiac monitor, continuous pulse ox and blood pressure Approach: midline Location: L3-4 Injection technique: single-shot Needle Needle type: Pencan  Needle gauge: 24 G Needle length: 10 cm Assessment Sensory level: T8

## 2015-09-02 NOTE — Progress Notes (Signed)
PT Cancellation Note  Patient Details Name: Ian Aguilar MRN: [id number] DOB: 11-08-1940   Cancelled Treatment:    Reason Eval/Treat Not Completed: Patient at surgery, will continue to follow as appropriate.    Cassell Clement, PT, CSCS Pager [phone] Office [phone]  09/02/2015, 11:35 AM

## 2015-09-02 NOTE — Progress Notes (Signed)
I spoke with Ian Aguilar wife over the phone and had a long talk with him about our plan.  He is undergoing spinal anesthesia for a right hip IM nail.  I discussed his wrist alignment currently it is borderline acceptable alignment. Given that he is safer with spinal anesthesia I would recommend a closed reduction and splinting of his wrist today as I feel we can get acceptable alignment. Both he and his wife think this is the best option as well. We will watch it closely on x-ray.    Ian Aguilar D

## 2015-09-02 NOTE — H&P (View-Only) (Signed)
ORTHOPAEDIC CONSULTATION  REQUESTING PHYSICIAN: Roney Jaffe, MD  Chief Complaint: Right hip pain/PE fall   Assessment: Principal Problem:   Fracture of right hip requiring operative repair [facility]) Active Problems:   Cirrhosis- imaging diagnosis 2013   S/P hip replacement   Alcohol abuse   Tobacco abuse   COPD (chronic obstructive pulmonary disease)   Distal radius fracture, right  Mildly displaced right hip intertrochanteric fracture. Comminuted impacted right distal radius fracture.  Plan: Right intertrochanteric IM nail by Dr. [name] today.  The risks benefits and alternatives were discussed with the patient including but not limited to the risks of nonoperative treatment, versus surgical intervention including infection, bleeding, nerve injury, periprosthetic fracture, the need for revision surgery, dislocation, leg length discrepancy, blood clots, cardiopulmonary complications, morbidity, mortality, among others, and they were willing to proceed.   Weight Bearing Status: NWB, plan for WBAT post op. VTE px: per primary.  HPI: Ian Aguilar is a 75 y.o. male with a history of DJD and left THA in 2013. Subsequent fall and periprosthetic fracture in April managed nonoperatively. He also has history of COPD, tobacco abuse, and cirrhosis secondary to alcohol abuse.  He presents with right hip and wrist pain after fall onto right side while at home. He was standing at his bar and felt lightheaded. Denies hitting head. No loss of consciousness. No current chest pain or shortness of breath.  He had been eating, drinking, and voiding normally. Last meal lunchtime yesterday.  Past Medical History  Diagnosis Date  . COPD (chronic obstructive pulmonary disease) ([place])   . ETOH abuse   . Cirrhosis ([place])     confirmed by MRI on 07/04/11.  no immunizations  . Hyperlipidemia   . Depression with anxiety   . Anxiety   . Arthritis    Past Surgical History  Procedure  Laterality Date  . Knee surgery      left-arthroscopy-[place]  . Skull fracture elevation      fractured cheeck bone repaired  . Broken arm  6 yrs ago    left otif of wrist-[place]  . External ear surgery      right ear-cleaned out ear and created new eardrum  . Wisdom tooth extraction    . Bilateral cataract surgery      [place]  . Colonoscopy  1996    [place]: external hemorrhoids, no polyps  . Colonoscopy  06/28/2011    Dr. Ala Bent diverticulosis,tubular adenoma, hyperplastic polyp  . Esophagogastroduodenoscopy  06/28/2011    Dr. Chelsea Aus erosive reflux esophagitis, hiatal hernia-gastritis  . Inguinal hernia repair  2-3 yrs ago    left-[place]_  . Total hip arthroplasty  12/18/2011    Procedure: TOTAL HIP ARTHROPLASTY;  Surgeon: Carole Civil, MD;  Location: [facility];  Service: Orthopedics;  Laterality: Left;   Social History   Social History  . Marital Status: Married    Spouse Name: N/A  . Number of Children: N/A  . Years of Education: N/A   Occupational History  . retired    Social History Main Topics  . Smoking status: Current Every Day Smoker -- 0.50 packs/day for 60 years    Types: Cigarettes  . Smokeless tobacco: Never Used     Comment: 4-10 a day  . Alcohol Use: Yes     Comment:  3-4 beer/day  . Drug Use: No  . Sexual Activity: Yes    Birth Control/ Protection: None   Other Topics Concern  . None  Social History Narrative   Family History  Problem Relation Age of Onset  . Colon cancer Neg Hx    No Known Allergies Prior to Admission medications   Medication Sig Start Date End Date Taking? Authorizing Provider  ALPRAZolam Duanne Moron) 0.5 MG tablet Take 0.25 mg by mouth every 6 (six) hours as needed for anxiety or sleep. Reported on 08/05/2015   Yes Historical Provider, MD  Cholecalciferol (VITAMIN D PO) Take 1 capsule by mouth every morning.    Yes Historical Provider, MD  ibuprofen (ADVIL,MOTRIN) 200 MG tablet Take 200 mg by mouth every 6  (six) hours as needed for moderate pain.    Yes Historical Provider, MD  tiotropium (SPIRIVA) 18 MCG inhalation capsule Place 18 mcg into inhaler and inhale every morning.    Yes Historical Provider, MD  oxyCODONE-acetaminophen (PERCOCET/ROXICET) 5-325 MG tablet Take 1 tablet by mouth every 4 (four) hours as needed for severe pain. Patient not taking: Reported on 08/05/2015 06/17/15   Carole Civil, MD   Dg Wrist Complete Right  09/01/2015  CLINICAL DATA:  Fall in his kitchen after becoming dizzy after standing up. Right wrist pain and deformity. EXAM: RIGHT WRIST - COMPLETE 3+ VIEW COMPARISON:  None. FINDINGS: Impacted mildly comminuted and angulated distal radius fracture with mild apex volar angulation. Fracture extends to the distal radial ulnar joint. There is likely radiocarpal extension. Nondisplaced ulna styloid fracture. Associated soft tissue edema about the wrist IMPRESSION: Comminuted angulated impacted distal radius fracture extending into the distal radial ulnar and radiocarpal joints. Nondisplaced ulna styloid fracture. Electronically Signed   By: Jeb Levering M.D.   On: 09/01/2015 19:41   Dg Chest Port 1 View  09/01/2015  CLINICAL DATA:  Right hip fracture.  Preoperative examination. EXAM: PORTABLE CHEST 1 VIEW COMPARISON:  PA and lateral chest 02/22/2009. FINDINGS: The chest is somewhat hyperexpanded but the lungs are clear. Heart size is normal. No pneumothorax or pleural effusion. IMPRESSION: Pulmonary hyperexpansion suggestive of emphysema.  No acute disease. Electronically Signed   By: Inge Rise M.D.   On: 09/01/2015 20:27   Dg Hip Unilat With Pelvis 2-3 Views Right  09/01/2015  CLINICAL DATA:  Right hip pain after fall today. Fall after becoming dizzy and his kitchen. EXAM: DG HIP (WITH OR WITHOUT PELVIS) 2-3V RIGHT COMPARISON:  Left hip radiographs 06/11/2015 FINDINGS: Mildly displaced minimally comminuted right hip intertrochanteric fracture. Femoral head remains seated  in the acetabulum. 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 Pubic symphysis and sacroiliac joints remain congruent. Left hip prosthesis partially included. Atherosclerotic calcifications are seen. IMPRESSION: Mildly displaced right hip intertrochanteric fracture. Electronically Signed   By: Jeb Levering M.D.   On: 09/01/2015 19:44    ROS: All other systems have been reviewed and were otherwise negative with the exception of those mentioned in the HPI and as above.  Objective: Labs cbc  Recent Labs  09/01/15 2020  WBC 6.6  HGB 13.2  HCT 38.1*  PLT 154    Recent Labs  09/01/15 2020  INR 1.07     Recent Labs  09/01/15 2020  NA 132*  K 4.2  CL 95*  CO2 30  GLUCOSE 101*  BUN 11  CREATININE 1.02  CALCIUM 8.9    Physical Exam: Filed Vitals:   09/02/15 0005 09/02/15 0506  BP: 167/64 151/60  Pulse: 81 79  Temp: 98.3 F (36.8 C) 99.3 F (37.4 C)  Resp: 17 17   General: Alert, no acute distress. Supine in  bed on arrival. Cardiovascular: RRR. No pedal edema Respiratory: CTA without crackles or wheeze. No cyanosis, no use of accessory musculature GI: No organomegaly, abdomen is soft and non-tender Skin: No lesions over her hip. Open skin tear distal dorsal aspect of right arm. Tegaderm in place. Neurologic: Sensation intact distally. Psychiatric: Patient is competent for consent with normal mood and affect  MUSCULOSKELETAL:  Right lateral hip mildly tender to palpation. Pain with movement. Externally rotated. Neurovascularly intact. Distal sensation intact. Right wrist with skin abrasion noted above. Marked ecchymosis and moderate swelling of the right wrist. Strength and movement of an limited by pain. Distal sensation intact. Neurovascularly intact. Other extremities are atraumatic with painless ROM and NVI.  Prudencio Burly III PA-C 09/02/2015 6:03 AM

## 2015-09-02 NOTE — Op Note (Signed)
DATE OF SURGERY:  09/02/2015  TIME: 8:39 AM  PATIENT NAME:  Ian Aguilar  AGE: 75 y.o.  PRE-OPERATIVE DIAGNOSIS:  right hip fracture  POST-OPERATIVE DIAGNOSIS:  SAME  PROCEDURE:  RIGHT  INTERTROCHANTRIC HIP, RIGHT WRIST REDUCTION  SURGEON:  Lyrik Dockstader D  ASSISTANT:  Roxan Hockey, PA-C, She was present and scrubbed throughout the case, critical for completion in a timely fashion, and for retraction, instrumentation, and closure.   OPERATIVE IMPLANTS: Stryker Gamma Nail  PREOPERATIVE INDICATIONS:  Ian Aguilar is a 75 y.o. year old who fell and suffered a hip fracture. He was brought into the ER and then admitted and optimized and then elected for surgical intervention.    The risks benefits and alternatives were discussed with the patient including but not limited to the risks of nonoperative treatment, versus surgical intervention including infection, bleeding, nerve injury, malunion, nonunion, hardware prominence, hardware failure, need for hardware removal, blood clots, cardiopulmonary complications, morbidity, mortality, among others, and they were willing to proceed.    OPERATIVE PROCEDURE:  The patient was brought to the operating room and placed in the supine position. General anesthesia was administered. He was placed on the fracture table.  Closed reduction was performed under C-arm guidance. Time out was then performed after sterile prep and drape. He received preoperative antibiotics.  Incision was made proximal to the greater trochanter. A guidewire was placed in the appropriate position. Confirmation was made on AP and lateral views. The above-named nail was opened. I opened the proximal femur with a reamer. I then placed the nail by hand easily down. I did not need to ream the femur.  Once the nail was completely seated, I placed a guidepin into the femoral head into the center center position. I measured the length, and then reamed the lateral cortex and up  into the head. I then placed the lag screw. Slight compression was applied. Anatomic fixation achieved. Bone quality was mediocre.  I then secured the proximal interlocking bolt, and took off a half a turn, and then removed the instruments, and took final C-arm pictures AP and lateral the entire length of the leg.   Anatomic reconstruction was achieved, and the wounds were irrigated copiously and closed with Vicryl followed by staples and sterile gauze for the skin. The patient was awakened and returned to PACU in stable and satisfactory condition. There no complications and the patient tolerated the procedure well.  He will be weightbearing as tolerated, and will be on chemical px  for a period of four weeks after discharge.   [name], M.D.    This note was generated using a template and dragon dictation system. In light of that, I have reviewed the note and all aspects of it are applicable to this case. Any dictation errors are due to the computerized dictation system.

## 2015-09-03 ENCOUNTER — Encounter (HOSPITAL_COMMUNITY): Payer: Self-pay | Admitting: Orthopedic Surgery

## 2015-09-03 DIAGNOSIS — Z720 Tobacco use: Secondary | ICD-10-CM | POA: Diagnosis not present

## 2015-09-03 DIAGNOSIS — S52501G Unspecified fracture of the lower end of right radius, subsequent encounter for closed fracture with delayed healing: Secondary | ICD-10-CM

## 2015-09-03 DIAGNOSIS — D696 Thrombocytopenia, unspecified: Secondary | ICD-10-CM

## 2015-09-03 DIAGNOSIS — J438 Other emphysema: Secondary | ICD-10-CM | POA: Diagnosis not present

## 2015-09-03 DIAGNOSIS — S72141A Displaced intertrochanteric fracture of right femur, initial encounter for closed fracture: Secondary | ICD-10-CM | POA: Insufficient documentation

## 2015-09-03 DIAGNOSIS — E871 Hypo-osmolality and hyponatremia: Secondary | ICD-10-CM

## 2015-09-03 DIAGNOSIS — S72141G Displaced intertrochanteric fracture of right femur, subsequent encounter for closed fracture with delayed healing: Secondary | ICD-10-CM | POA: Diagnosis not present

## 2015-09-03 DIAGNOSIS — K7460 Unspecified cirrhosis of liver: Secondary | ICD-10-CM | POA: Diagnosis not present

## 2015-09-03 DIAGNOSIS — F1010 Alcohol abuse, uncomplicated: Secondary | ICD-10-CM | POA: Diagnosis not present

## 2015-09-03 DIAGNOSIS — S52501D Unspecified fracture of the lower end of right radius, subsequent encounter for closed fracture with routine healing: Secondary | ICD-10-CM | POA: Diagnosis not present

## 2015-09-03 DIAGNOSIS — I10 Essential (primary) hypertension: Secondary | ICD-10-CM | POA: Diagnosis not present

## 2015-09-03 LAB — CBC
HCT: 28.6 % — ABNORMAL LOW (ref 39.0–52.0)
Hemoglobin: 9.8 g/dL — ABNORMAL LOW (ref 13.0–17.0)
MCH: 33.6 pg (ref 26.0–34.0)
MCHC: 34.3 g/dL (ref 30.0–36.0)
MCV: 97.9 fL (ref 78.0–100.0)
Platelets: 94 10*3/uL — ABNORMAL LOW (ref 150–400)
RBC: 2.92 MIL/uL — ABNORMAL LOW (ref 4.22–5.81)
RDW: 14.4 % (ref 11.5–15.5)
WBC: 4.6 10*3/uL (ref 4.0–10.5)

## 2015-09-03 LAB — BASIC METABOLIC PANEL
Anion gap: 6 (ref 5–15)
BUN: 10 mg/dL (ref 6–20)
CO2: 25 mmol/L (ref 22–32)
Calcium: 8.4 mg/dL — ABNORMAL LOW (ref 8.9–10.3)
Chloride: 100 mmol/L — ABNORMAL LOW (ref 101–111)
Creatinine, Ser: 0.72 mg/dL (ref 0.61–1.24)
GFR calc Af Amer: >60 mL/min (ref >60)
GFR calc non Af Amer: >60 mL/min (ref >60)
Glucose, Bld: 93 mg/dL (ref 65–99)
Potassium: 3.9 mmol/L (ref 3.5–5.1)
Sodium: 131 mmol/L — ABNORMAL LOW (ref 135–145)

## 2015-09-03 LAB — TYPE AND SCREEN
ABO/RH(D): A NEG
Antibody Screen: NEGATIVE

## 2015-09-03 LAB — ABO/RH: ABO/RH(D): A NEG

## 2015-09-03 LAB — PHOSPHORUS: Phosphorus: 2.9 mg/dL (ref 2.5–4.6)

## 2015-09-03 LAB — MAGNESIUM: Magnesium: 1.6 mg/dL — ABNORMAL LOW (ref 1.7–2.4)

## 2015-09-03 LAB — VITAMIN D 25 HYDROXY (VIT D DEFICIENCY, FRACTURES): Vit D, 25-Hydroxy: 42.9 ng/mL (ref 30.0–100.0)

## 2015-09-03 NOTE — Progress Notes (Signed)
Physical Therapy Treatment Patient Details Name: Ian Aguilar MRN: [id number] DOB: Aug 07, 1940 Today's Date: 09/03/2015    History of Present Illness 75 y.o. male with a fall and subsequent Rt intertrochanteric hip fracture and Rt wrist fx. Pt is now s/p RIGHT INTERTROCHANTRIC HIP nail, RIGHT WRIST REDUCTION. The pt had a previous fall with Lt periprosthetic fracture in April managed nonoperatively PMH: left THA 2013 , COPD, tobacco abuse, and cirrhosis secondary to alcohol abuse, anxiety, depression.     PT Comments    Pt mobilizing slowly, able to perform sit/stand transfers with +2 assist but pt unwilling to attempt steps due to pain. Pt had just recently performed transfer from bed with OT. After talking with patient about current mobility level, he reports that he feels like he will need to go somewhere for more rehabilitation following his hospital stay.   Follow Up Recommendations  Supervision for mobility/OOB;CIR     Equipment Recommendations  None recommended by PT (to be addressed at next venue)    Recommendations for Other Services Rehab consult     Precautions / Restrictions Precautions Precautions: Fall Restrictions Weight Bearing Restrictions: Yes RLE Weight Bearing: Weight bearing as tolerated    Mobility  Bed Mobility               General bed mobility comments: up in chair upon arrival   Transfers Overall transfer level: Needs assistance Equipment used: Right platform walker Transfers: Sit to/from Stand Sit to Stand: +2 physical assistance;Mod assist (from chair)         General transfer comment: cues for hand position from chair and with rw.   Ambulation/Gait             General Gait Details: pt refusing to attempt ambulation due to pain.    Stairs            Wheelchair Mobility    Modified Rankin (Stroke Patients Only)       Balance Overall balance assessment: Needs assistance Sitting-balance support: No upper extremity  supported Sitting balance-Leahy Scale: Fair     Standing balance support: Bilateral upper extremity supported Standing balance-Leahy Scale: Poor Standing balance comment: using rw and +2 min assist                    Cognition Arousal/Alertness: Awake/alert Behavior During Therapy: WFL for tasks assessed/performed Overall Cognitive Status: Within Functional Limits for tasks assessed                      Exercises      General Comments        Pertinent Vitals/Pain Pain Assessment: Faces Faces Pain Scale: Hurts even more Pain Location: Rt hip Pain Descriptors / Indicators: Grimacing;Guarding Pain Intervention(s): Limited activity within patient's tolerance;Monitored during session    Home Living                      Prior Function            PT Goals (current goals can now be found in the care plan section) Acute Rehab PT Goals Patient Stated Goal: move without pain PT Goal Formulation: With patient Time For Goal Achievement: 09/16/15 Potential to Achieve Goals: Good Progress towards PT goals: Progressing toward goals    Frequency  Min 5X/week    PT Plan Discharge plan needs to be updated    Co-evaluation             End of  Session Equipment Utilized During Treatment: Gait belt Activity Tolerance: Patient limited by pain;Patient limited by fatigue Patient left: in chair;with call bell/phone within reach     Time: 1139-1158 PT Time Calculation (min) (ACUTE ONLY): 19 min  Charges:  $Therapeutic Activity: 8-22 mins                    G Codes:      Cassell Clement, PT, CSCS Pager [phone] Office [phone]  09/03/2015, 12:13 PM

## 2015-09-03 NOTE — Evaluation (Signed)
Occupational Therapy Evaluation Patient Details Name: Ian Aguilar MRN: [id number] DOB: 03-Dec-1940 Today's Date: 09/03/2015    History of Present Illness 75 y.o. male with a fall and subsequent Rt intertrochanteric hip fracture and Rt wrist fx. Pt is now s/p RIGHT INTERTROCHANTRIC HIP nail, RIGHT WRIST REDUCTION. The pt had a previous fall with Lt periprosthetic fracture in April managed nonoperatively PMH: left THA 2013 , COPD, tobacco abuse, and cirrhosis secondary to alcohol abuse, anxiety, depression.    Clinical Impression   Pt with decline in function and safety with ADLs and ADL mobility with decreased strength, balance and endurance. Pt would benefit from acute OT services to address impairments to increase level of function and safety    Follow Up Recommendations  CIR;Supervision/Assistance - 24 hour    Equipment Recommendations  Other (comment) (TBD at next level of care)    Recommendations for Other Services       Precautions / Restrictions Precautions Precautions: Fall Restrictions Weight Bearing Restrictions: Yes RLE Weight Bearing: Weight bearing as tolerated      Mobility Bed Mobility Overal bed mobility: Needs Assistance Bed Mobility: Supine to Sit     Supine to sit: Mod assist;HOB elevated;+2 for physical assistance     General bed mobility comments: up in chair upon arrival   Transfers Overall transfer level: Needs assistance Equipment used: Right platform walker Transfers: Sit to/from Stand;Stand Pivot Transfers Sit to Stand: +2 physical assistance;Mod assist Stand pivot transfers: Max assist;+2 physical assistance       General transfer comment: cues for hand position from chair and with rw.     Balance Overall balance assessment: Needs assistance Sitting-balance support: No upper extremity supported;Feet supported Sitting balance-Leahy Scale: Fair     Standing balance support: Single extremity supported;During functional  activity Standing balance-Leahy Scale: Poor Standing balance comment: using rw and +2 min assist                            ADL Overall ADL's : Needs assistance/impaired     Grooming: Wash/dry hands;Wash/dry face;Sitting;Minimal assistance;Min guard   Upper Body Bathing: Minimal assitance;Sitting   Lower Body Bathing: Maximal assistance   Upper Body Dressing : Minimal assistance;Sitting   Lower Body Dressing: Total assistance   Toilet Transfer: BSC;+2 for physical assistance;Stand-pivot;Maximal assistance (simulated)   Toileting- Clothing Manipulation and Hygiene: Total assistance Toileting - Clothing Manipulation Details (indicate cue type and reason): pt able to use urinal seated EOB     Functional mobility during ADLs: Moderate assistance;Maximal assistance;+2 for physical assistance (platform walker)       Vision  wears reading glasses, no change from baseline              Pertinent Vitals/Pain Pain Assessment: Faces Pain Score: 6  Faces Pain Scale: Hurts even more Pain Location: R hip Pain Descriptors / Indicators: Aching;Sore;Grimacing;Guarding Pain Intervention(s): Limited activity within patient's tolerance;Monitored during session;Repositioned     Hand Dominance Right   Extremity/Trunk Assessment Upper Extremity Assessment Upper Extremity Assessment: LUE deficits/detail;Generalized weakness RUE Deficits / Details: in splint but pt able to raise arm without assist   Lower Extremity Assessment Lower Extremity Assessment: Defer to PT evaluation       Communication Communication Communication: No difficulties   Cognition Arousal/Alertness: Awake/alert Behavior During Therapy: WFL for tasks assessed/performed Overall Cognitive Status: Within Functional Limits for tasks assessed  General Comments   pt pleasant and cooperative                 Home Living Family/patient expects to be discharged to::  Inpatient rehab Living Arrangements: Spouse/significant other Available Help at Discharge: Available 24 hours/day;Family Type of Home: House Home Access: Stairs to enter [employer] of Steps: 2 Entrance Stairs-Rails: Left Home Layout: Multi-level Alternate Level Stairs-Number of Steps: 2-7 steps depending on the level   Bathroom Shower/Tub: Tub/shower unit   Bathroom Toilet: Handicapped height     Home Equipment: Environmental consultant - 2 wheels;Wheelchair - manual;Cane - single point   Additional Comments: Pt states that he feels he may need to go somewhere for rehab after acute stay      Prior Functioning/Environment Level of Independence: Independent with assistive device(s)        Comments: used SPC with community ambulation    OT Diagnosis: Generalized weakness;Acute pain   OT Problem List: Impaired UE functional use;Pain;Impaired balance (sitting and/or standing);Decreased activity tolerance;Decreased coordination;Decreased knowledge of use of DME or AE;Decreased strength   OT Treatment/Interventions: Self-care/ADL training;Patient/family education;Therapeutic activities;DME and/or AE instruction    OT Goals(Current goals can be found in the care plan section) Acute Rehab OT Goals Patient Stated Goal: move without pain OT Goal Formulation: With patient Time For Goal Achievement: 09/10/15 Potential to Achieve Goals: Good ADL Goals Pt Will Perform Grooming: with min guard assist;with supervision;with set-up;sitting Pt Will Perform Upper Body Bathing: with min guard assist;with supervision;with set-up;sitting Pt Will Perform Lower Body Bathing: with mod assist;sitting/lateral leans Pt Will Perform Upper Body Dressing: with min guard assist;with supervision;with set-up;sitting Pt Will Transfer to Toilet: with mod assist;bedside commode;with min assist  OT Frequency: Min 2X/week   Barriers to D/C: Decreased caregiver support                        End of  Session Equipment Utilized During Treatment: Gait belt;Rolling walker (platform walker) Nurse Communication: Mobility status  Activity Tolerance: Patient limited by pain Patient left: in chair;with call bell/phone within reach   Time: 1572-6203 OT Time Calculation (min): 31 min Charges:  OT General Charges $OT Visit: 1 Procedure OT Evaluation $OT Eval Moderate Complexity: 1 Procedure OT Treatments $Therapeutic Activity: 8-22 mins G-Codes:    Britt Bottom 09/03/2015, 12:30 PM

## 2015-09-03 NOTE — Progress Notes (Signed)
   Assessment: 1 Day Post-Op  S/P Procedure(s) (LRB): RIGHT  INTERTROCHANTRIC HIP (Right) RIGHT WRIST REDUCTION (Right) by Dr. Ernesta Amble. Percell Miller on 09/02/15  Principal Problem:   Fracture of right hip requiring operative repair [facility]) Active Problems:   Cirrhosis- imaging diagnosis 2013   S/P hip replacement   Alcohol abuse   Tobacco abuse   COPD (chronic obstructive pulmonary disease)   Distal radius fracture, right  ADDITIONAL DIAGNOSIS:  Expected Acute Blood Loss Anemia, likely w/ dilutional component.  Plan: Advance diet Up with therapy  Weight Bearing: Weight Bearing as Tolerated (WBAT) right leg.  NWB Right arm. Dressings: PRN VTE prophylaxis: Lovenox .  Likely ASA '325mg'$  for 30 days after d/c. Dispo: per primary.  PT eval pending.  Subjective: Patient reports pain as moderate.  Taking PO meds.  No flatus.  Not oob yet.  Tolerating liquids and some solids.  Objective:   VITALS:   Filed Vitals:   09/02/15 1511 09/02/15 1612 09/02/15 2038 09/03/15 0542  BP: 131/78 129/81 123/49 110/50  Pulse: 86 84 69 72  Temp: 98.6 F (37 C) 98.4 F (36.9 C) 98.4 F (36.9 C) 98 F (36.7 C)  TempSrc: Oral Oral Oral Oral  Resp: '16 16 16 16  '$ Height:      Weight:      SpO2: 98% 99% 94% 96%    Lab Results  Component Value Date   WBC 4.8 09/02/2015   HGB 9.9* 09/02/2015   HCT 28.7* 09/02/2015   MCV 98.3 09/02/2015   PLT 104* 09/02/2015   BMET    Component Value Date/Time   NA 132* 09/01/2015 2020   K 4.2 09/01/2015 2020   CL 95* 09/01/2015 2020   CO2 30 09/01/2015 2020   GLUCOSE 101* 09/01/2015 2020   BUN 11 09/01/2015 2020   CREATININE 0.83 09/02/2015 1620   CREATININE 0.89 06/26/2011 1506   CALCIUM 8.9 09/01/2015 2020   GFRNONAA >60 09/02/2015 1620   GFRAA >60 09/02/2015 1620     Physical Exam General: NAD.  Asleep on arrival.  Neurologically intact ABD soft Neurovascular intact Sensation intact distally Dorsiflexion/Plantar flexion intact Incision:  dressing C/D/I and scant drainage   Prudencio Burly III 09/03/2015, 6:54 AM

## 2015-09-03 NOTE — Care Management Important Message (Signed)
Important Message  Patient Details  Name: Ian Aguilar MRN: [id number] Date of Birth: Oct 25, 1940   Medicare Important Message Given:  Yes    Loann Quill 09/03/2015, 10:35 AM

## 2015-09-03 NOTE — Progress Notes (Addendum)
PROGRESS NOTE  Ian Aguilar OFB:[id number] DOB: [birth year] DOA: 09/01/2015 PCP: Purvis Kilts, MD  Brief History:  75 year old male with a history of liver cirrhosis, alcohol and tobacco dependence, COPD, hyperlipidemia presented with syncope resulting in a fall. Unfortunately, the fall resulted in a right hip intertrochanteric fracture as well as a comminuted impacted distal radial fracture extending into the distal radial ulnar and radial carpal joints on the right. There was also a nondisplaced styloid fracture. The patient was at home drinking beer when he felt dizzy after standing up resulting in syncopal episode. The patient states that he drinks 4-6 beers per day. He continues to smoke one half pack per day x 60 years.  Ortho was consulted and Dr. [name] for the patient to the operating room on 09/02/2015 for right intertrochanteric hip pain as well as right wrist reduction.  Assessment/Plan: Right intertrochanteric hip fracture/right wrist fracture -09/02/15--Status post IM nail and right wrist reduction -PT/OT-->CIR -DVT prophylaxis per orthopedics  -Pain control  Syncope -Likely vasovagal in the setting of volume depletion -Urinalysis negative for pyuria -Check orthostatics -Echocardiogram -Telemetry without concerning dysrhythmias  Alcohol abuse -continue CIWA -May '13 - EGD r/o varices > no varices seen, mild reflux esophagitis, gastric erosions   Thrombocytopenia -Secondary to liver cirrhosis/alcoholic liver disease -Check serum B12 -am CBC  COPD -Continue Spiriva -09/01/2015 chest x-ray negative for infiltrates -Ambulatory pulse oximetry on room air  Tobacco abuse -Tobacco cessation discussed -NicoDerm patch deferred   Hyponatremia -Secondary to liver cirrhosis -Stable -am BMP  Disposition Plan:   CIR or SNF when cleared by ortho Family Communication:   Wife updated at bedside 7/7--total time 35 min; >50% spent face to face  counseling and coordinating care  Consultants:  Nickolas Madrid  Code Status:  FULL  DVT Prophylaxis:  Central Lovenox   Procedures: As Listed in Progress Note Above  Antibiotics: None    Subjective:  patient complains of pain in his right hip and right wrist that is exacerbated by movement. Denies any fevers, chills, chest pain, shortness breath, nausea, vomiting, diarrhea, abdominal pain. No dysuria or hematuria. No rashes.  Objective: Filed Vitals:   09/02/15 2038 09/03/15 0542 09/03/15 0811 09/03/15 1500  BP: 123/49 110/50  117/51  Pulse: 69 72  68  Temp: 98.4 F (36.9 C) 98 F (36.7 C)  97.8 F (36.6 C)  TempSrc: Oral Oral  Oral  Resp: 16 16    Height:      Weight:      SpO2: 94% 96% 92% 94%    Intake/Output Summary (Last 24 hours) at 09/03/15 1726 Last data filed at 09/03/15 1500  Gross per 24 hour  Intake    480 ml  Output    550 ml  Net    -70 ml   Weight change:  Exam:   General:  Pt is alert, follows commands appropriately, not in acute distress  HEENT: No icterus, No thrush, No neck mass, [place]/AT  Cardiovascular: RRR, S1/S2, no rubs, no gallops  Respiratory: Bibasilar rales without wheezing. Good air movement.   Abdomen: Soft/+BS, non tender, non distended, no guarding  Extremities: No edema, No lymphangitis, No petechiae, No rashes, no synovitis   Data Reviewed: I have personally reviewed following labs and imaging studies Basic Metabolic Panel:  Recent Labs Lab 09/01/15 2020 09/02/15 1620 09/03/15 0621  NA 132*  --  131*  K 4.2  --  3.9  CL 95*  --  100*  CO2 30  --  25  GLUCOSE 101*  --  93  BUN 11  --  10  CREATININE 1.02 0.83 0.72  CALCIUM 8.9  --  8.4*  MG  --   --  1.6*  PHOS  --   --  2.9   Liver Function Tests:  Recent Labs Lab 09/01/15 2020  AST 31  ALT 22  ALKPHOS 68  BILITOT 1.5*  PROT 7.2  ALBUMIN 4.1   No results for input(s): LIPASE, AMYLASE in the last 168 hours. No results for input(s): AMMONIA in  the last 168 hours. Coagulation Profile:  Recent Labs Lab 09/01/15 2020  INR 1.07   CBC:  Recent Labs Lab 09/01/15 2020 09/02/15 1620 09/03/15 0621  WBC 6.6 4.8 4.6  NEUTROABS 5.2  --   --   HGB 13.2 9.9* 9.8*  HCT 38.1* 28.7* 28.6*  MCV 97.4 98.3 97.9  PLT 154 104* 94*   Cardiac Enzymes: No results for input(s): CKTOTAL, CKMB, CKMBINDEX, TROPONINI in the last 168 hours. BNP: Invalid input(s): POCBNP CBG: No results for input(s): GLUCAP in the last 168 hours. HbA1C: No results for input(s): HGBA1C in the last 72 hours. Urine analysis:    Component Value Date/Time   COLORURINE AMBER* 09/02/2015 2135   APPEARANCEUR CLEAR 09/02/2015 2135   LABSPEC 1.016 09/02/2015 2135   PHURINE 6.0 09/02/2015 2135   GLUCOSEU NEGATIVE 09/02/2015 2135   HGBUR NEGATIVE 09/02/2015 2135   BILIRUBINUR NEGATIVE 09/02/2015 2135   KETONESUR NEGATIVE 09/02/2015 2135   PROTEINUR NEGATIVE 09/02/2015 2135   NITRITE NEGATIVE 09/02/2015 2135   LEUKOCYTESUR NEGATIVE 09/02/2015 2135   Sepsis Labs: '@LABRCNTIP'$ (procalcitonin:4,lacticidven:4) ) Recent Results (from the past 240 hour(s))  MRSA PCR Screening     Status: None   Collection Time: 09/02/15 12:49 AM  Result Value Ref Range Status   MRSA by PCR NEGATIVE NEGATIVE Final    Comment:        The GeneXpert MRSA Assay (FDA approved for NASAL specimens only), is one component of a comprehensive MRSA colonization surveillance program. It is not intended to diagnose MRSA infection nor to guide or monitor treatment for MRSA infections.      Scheduled Meds: . docusate sodium  100 mg Oral BID  . enoxaparin (LOVENOX) injection  40 mg Subcutaneous Q24H  . folic acid  1 mg Oral Daily  . LORazepam  0-4 mg Intravenous Q6H   Followed by  . [START ON 09/04/2015] LORazepam  0-4 mg Intravenous Q12H  . multivitamin with minerals  1 tablet Oral Daily  . thiamine  100 mg Oral Daily  . tiotropium  18 mcg Inhalation Daily   Continuous Infusions: .  sodium chloride 75 mL/hr at 09/02/15 1320    Procedures/Studies: Wrist 2 Views Right  09/02/2015  CLINICAL DATA:  Distal radial fracture, in plaster views EXAM: RIGHT WRIST - 2 VIEW COMPARISON:  Pre reduction images dated September 01, 2015 FINDINGS: The patient has undergone close reduction of the distal radial metaphyseal fracture. There remains mild angulation and impaction. The fracture line reaches the articular surface of the distal radius. The adjacent ulna is intact. IMPRESSION: Stable positioning of the comminuted angulated impacted fracture of the distal right radial metaphysis. The radiocarpal joint is involved. Electronically Signed   By: Ilayda Toda  [place] M.D.   On: 09/02/2015 10:52   Dg Wrist Complete Right  09/01/2015  CLINICAL DATA:  Fall in his kitchen after becoming dizzy after standing up. Right wrist pain and deformity. EXAM:  RIGHT WRIST - COMPLETE 3+ VIEW COMPARISON:  None. FINDINGS: Impacted mildly comminuted and angulated distal radius fracture with mild apex volar angulation. Fracture extends to the distal radial ulnar joint. There is likely radiocarpal extension. Nondisplaced ulna styloid fracture. Associated soft tissue edema about the wrist IMPRESSION: Comminuted angulated impacted distal radius fracture extending into the distal radial ulnar and radiocarpal joints. Nondisplaced ulna styloid fracture. Electronically Signed   By: Jeb Levering M.D.   On: 09/01/2015 19:41   Dg Chest Port 1 View  09/01/2015  CLINICAL DATA:  Right hip fracture.  Preoperative examination. EXAM: PORTABLE CHEST 1 VIEW COMPARISON:  PA and lateral chest 02/22/2009. FINDINGS: The chest is somewhat hyperexpanded but the lungs are clear. Heart size is normal. No pneumothorax or pleural effusion. IMPRESSION: Pulmonary hyperexpansion suggestive of emphysema.  No acute disease. Electronically Signed   By: Inge Rise M.D.   On: 09/01/2015 20:27   Dg Hip Operative Unilat W Or W/o Pelvis Right  09/02/2015  CLINICAL  DATA:  ORIF. EXAM: OPERATIVE RIGHT HIP (WITH PELVIS IF PERFORMED) 3 VIEWS TECHNIQUE: Fluoroscopic spot image(s) were submitted for interpretation post-operatively. COMPARISON:  09/01/2015. FINDINGS: ORIF right intertrochanteric hip fracture. Good anatomic alignment. Hardware intact. Peripheral vascular calcification. IMPRESSION: 1.  ORIF right hip fracture good anatomic alignment. 2. Peripheral vascular disease . Electronically Signed   By: Marcello Moores  Register   On: 09/02/2015 08:51   Dg Hip Unilat With Pelvis 2-3 Views Left  08/05/2015  Radiology report Chief complaint x-ray evaluation of total hip prosthesis Postoperative x-rays status post LEFT  Total hip arthroplasty Findings:  A total hip prosthesis is noted. The femoral prosthesis does not show any loosening and is in acceptable alignment. The acetabular cup has appropriate abduction angle. There is no radiolucencies around the cup. There is no soft tissue calcification in the area of the abductor musculature Impression: Normal appearance of this total hip prosthesis   Dg Hip Unilat With Pelvis 2-3 Views Right  09/01/2015  CLINICAL DATA:  Right hip pain after fall today. Fall after becoming dizzy and his kitchen. EXAM: DG HIP (WITH OR WITHOUT PELVIS) 2-3V RIGHT COMPARISON:  Left hip radiographs 06/11/2015 FINDINGS: Mildly displaced minimally comminuted right hip intertrochanteric fracture. Femoral head remains seated in the acetabulum. 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 Pubic symphysis and sacroiliac joints remain congruent. Left hip prosthesis partially included. Atherosclerotic calcifications are seen. IMPRESSION: Mildly displaced right hip intertrochanteric fracture. Electronically Signed   By: Jeb Levering M.D.   On: 09/01/2015 19:44    Aviv Rota, DO  [facility] Pager [phone]  If 7PM-7AM, please contact night-coverage [email or web address] Password [email or web address] 09/03/2015, 5:26 PM   LOS: 2 days

## 2015-09-04 ENCOUNTER — Encounter (HOSPITAL_COMMUNITY): Payer: Self-pay | Admitting: Orthopedic Surgery

## 2015-09-04 DIAGNOSIS — Z720 Tobacco use: Secondary | ICD-10-CM | POA: Diagnosis not present

## 2015-09-04 DIAGNOSIS — S72141G Displaced intertrochanteric fracture of right femur, subsequent encounter for closed fracture with delayed healing: Secondary | ICD-10-CM | POA: Diagnosis not present

## 2015-09-04 DIAGNOSIS — K7460 Unspecified cirrhosis of liver: Secondary | ICD-10-CM | POA: Diagnosis not present

## 2015-09-04 DIAGNOSIS — S72141A Displaced intertrochanteric fracture of right femur, initial encounter for closed fracture: Secondary | ICD-10-CM | POA: Diagnosis not present

## 2015-09-04 DIAGNOSIS — F1010 Alcohol abuse, uncomplicated: Secondary | ICD-10-CM | POA: Diagnosis not present

## 2015-09-04 DIAGNOSIS — S52501G Unspecified fracture of the lower end of right radius, subsequent encounter for closed fracture with delayed healing: Secondary | ICD-10-CM | POA: Diagnosis not present

## 2015-09-04 DIAGNOSIS — J438 Other emphysema: Secondary | ICD-10-CM | POA: Diagnosis not present

## 2015-09-04 DIAGNOSIS — E871 Hypo-osmolality and hyponatremia: Secondary | ICD-10-CM | POA: Diagnosis not present

## 2015-09-04 DIAGNOSIS — S52501D Unspecified fracture of the lower end of right radius, subsequent encounter for closed fracture with routine healing: Secondary | ICD-10-CM | POA: Diagnosis not present

## 2015-09-04 DIAGNOSIS — I10 Essential (primary) hypertension: Secondary | ICD-10-CM | POA: Diagnosis not present

## 2015-09-04 LAB — VITAMIN B12: Vitamin B-12: 287 pg/mL (ref 180–914)

## 2015-09-04 MED ORDER — MAGNESIUM SULFATE 2 GM/50ML IV SOLN
2.0000 g | Freq: Once | INTRAVENOUS | Status: AC
  Administered 2015-09-04: 2 g via INTRAVENOUS
  Filled 2015-09-04: qty 50

## 2015-09-04 MED ORDER — VITAMIN B-12 1000 MCG PO TABS
500.0000 ug | ORAL_TABLET | Freq: Every day | ORAL | Status: DC
  Administered 2015-09-04 – 2015-09-06 (×3): 500 ug via ORAL
  Filled 2015-09-04 (×3): qty 1

## 2015-09-04 MED ORDER — SENNA 8.6 MG PO TABS
2.0000 | ORAL_TABLET | Freq: Every day | ORAL | Status: DC
  Administered 2015-09-04 – 2015-09-06 (×3): 17.2 mg via ORAL
  Filled 2015-09-04 (×3): qty 2

## 2015-09-04 NOTE — Progress Notes (Signed)
Subjective: 2 Days Post-Op Procedure(s) (LRB): RIGHT  INTERTROCHANTRIC HIP (Right) RIGHT WRIST REDUCTION (Right) Patient reports pain as mild.    Objective: Vital signs in last 24 hours: Temp:  [97.8 F (36.6 C)-98.7 F (37.1 C)] 98.7 F (37.1 C) (07/08 0605) Pulse Rate:  [68-77] 77 (07/08 0605) Resp:  [15] 15 (07/08 0605) BP: (117-140)/(51-58) 133/58 mmHg (07/08 0605) SpO2:  [92 %-98 %] 94 % (07/08 0818)  Intake/Output from previous day: 07/07 0701 - 07/08 0700 In: 480 [P.O.:480] Out: 625 [Urine:625] Intake/Output this shift:     Recent Labs  09/01/15 2020 09/02/15 1620 09/03/15 0621  HGB 13.2 9.9* 9.8*    Recent Labs  09/02/15 1620 09/03/15 0621  WBC 4.8 4.6  RBC 2.92* 2.92*  HCT 28.7* 28.6*  PLT 104* 94*    Recent Labs  09/01/15 2020 09/02/15 1620 09/03/15 0621  NA 132*  --  131*  K 4.2  --  3.9  CL 95*  --  100*  CO2 30  --  25  BUN 11  --  10  CREATININE 1.02 0.83 0.72  GLUCOSE 101*  --  93  CALCIUM 8.9  --  8.4*    Recent Labs  09/01/15 2020  INR 1.07    Neurologically intact Neurovascular intact Sensation intact distally Intact pulses distally Dorsiflexion/Plantar flexion intact Compartment soft  Assessment/Plan: 2 Days Post-Op Procedure(s) (LRB): RIGHT  INTERTROCHANTRIC HIP (Right) RIGHT WRIST REDUCTION (Right) Advance diet Up with therapy  WBAT RLE NWB RUE Dry dressing change prn   M. Tawanna Cooler, PA-C [facility] 09/04/2015, 10:31 AM    Jerilynn Mages Tawanna Cooler 09/04/2015, 10:30 AM

## 2015-09-04 NOTE — Progress Notes (Signed)
Physical Therapy Treatment Patient Details Name: Ian Aguilar MRN: [id number] DOB: May 25, 1940 Today's Date: 09/04/2015    History of Present Illness 75 y.o. male with a fall and subsequent Rt intertrochanteric hip fracture and Rt wrist fx. Pt is now s/p RIGHT INTERTROCHANTRIC HIP nail, RIGHT WRIST REDUCTION. The pt had a previous fall with Lt periprosthetic fracture in April managed nonoperatively PMH: left THA 2013 , COPD, tobacco abuse, and cirrhosis secondary to alcohol abuse, anxiety, depression.     PT Comments    Patient continues to be limited by pain. Worked on OOB transfers. Still unable to ambulate due to pain. Current plan remains appropriate.   Follow Up Recommendations  Supervision for mobility/OOB;CIR     Equipment Recommendations  None recommended by PT (to be addressed at next venue)    Recommendations for Other Services Rehab consult     Precautions / Restrictions Precautions Precautions: Fall Restrictions Weight Bearing Restrictions: Yes RLE Weight Bearing: Weight bearing as tolerated    Mobility  Bed Mobility Overal bed mobility: Needs Assistance Bed Mobility: Supine to Sit     Supine to sit: Mod assist;HOB elevated;+2 for physical assistance     General bed mobility comments: assist to bring R LE to EOB and elevate trunk into stiitin; cues for sequencing and technique with pt able to roll onto L shoulder and assist with pushing through UE  Transfers Overall transfer level: Needs assistance Equipment used: Right platform walker Transfers: Sit to/from Stand;Stand Pivot Transfers Sit to Stand: +2 physical assistance;Mod assist (from chair) Stand pivot transfers: Min assist;+2 physical assistance       General transfer comment: cues for hand placement and assist to power up into standing from EOB X1 and recliner X2; min A to pivot with assist to manage platform walker and weight shift to L   Ambulation/Gait                 Stairs            Wheelchair Mobility    Modified Rankin (Stroke Patients Only)       Balance     Sitting balance-Leahy Scale: Good       Standing balance-Leahy Scale: Poor                      Cognition Arousal/Alertness: Awake/alert Behavior During Therapy: WFL for tasks assessed/performed Overall Cognitive Status: Within Functional Limits for tasks assessed                      Exercises      General Comments        Pertinent Vitals/Pain Pain Assessment: 0-10 Pain Score: 6  Pain Location: R hip and R UE (hip > than UE) Pain Descriptors / Indicators: Guarding;Sore;Aching Pain Intervention(s): Limited activity within patient's tolerance;Monitored during session;Premedicated before session;Repositioned    Home Living                      Prior Function            PT Goals (current goals can now be found in the care plan section) Acute Rehab PT Goals Patient Stated Goal: move without pain PT Goal Formulation: With patient Time For Goal Achievement: 09/16/15 Potential to Achieve Goals: Good Progress towards PT goals: Progressing toward goals    Frequency  Min 5X/week    PT Plan Discharge plan needs to be updated    Co-evaluation  End of Session Equipment Utilized During Treatment: Gait belt Activity Tolerance: Patient limited by pain Patient left: in chair;with call bell/phone within reach     Time: 1143-1211 PT Time Calculation (min) (ACUTE ONLY): 28 min  Charges:  $Therapeutic Activity: 23-37 mins                    G Codes:      Salina April, PTA Pager: [phone]   09/04/2015, 1:07 PM

## 2015-09-04 NOTE — Anesthesia Postprocedure Evaluation (Signed)
Anesthesia Post Note  Patient: Ian Aguilar  Procedure(s) Performed: Procedure(s) (LRB): RIGHT  INTERTROCHANTRIC HIP (Right) RIGHT WRIST REDUCTION (Right)  Patient location during evaluation: PACU Anesthesia Type: Spinal Level of consciousness: awake Pain management: pain level controlled Vital Signs Assessment: post-procedure vital signs reviewed and stable Respiratory status: spontaneous breathing Cardiovascular status: stable Postop Assessment: patient able to bend at knees, no signs of nausea or vomiting and spinal receding Anesthetic complications: no     Last Vitals:  Filed Vitals:   09/04/15 1143 09/04/15 1536  BP: 139/63 121/43  Pulse: 84 78  Temp: 37.2 C 36.7 C  Resp:      Last Pain:  Filed Vitals:   09/04/15 1537  PainSc: Asleep   Pain Goal: Patients Stated Pain Goal: 3 (09/03/15 1230)               [name]

## 2015-09-04 NOTE — Progress Notes (Addendum)
PROGRESS NOTE  Ian Aguilar BUL:[id number] DOB: 02/11/1941 DOA: 09/01/2015 PCP: Purvis Kilts, MD  Brief History:  75 year old male with a history of liver cirrhosis, alcohol and tobacco dependence, COPD, hyperlipidemia presented with syncope resulting in a fall. Unfortunately, the fall resulted in a right hip intertrochanteric fracture as well as a comminuted impacted distal radial fracture extending into the distal radial ulnar and radial carpal joints on the right. There was also a nondisplaced styloid fracture. The patient was at home drinking beer when he felt dizzy after standing up resulting in syncopal episode. The patient states that he drinks 4-6 beers per day. He continues to smoke one half pack per day x 60 years. Ortho was consulted and Dr. [name] for the patient to the operating room on 09/02/2015 for right intertrochanteric hip pain as well as right wrist reduction.  Assessment/Plan: Right intertrochanteric hip fracture/right wrist fracture -09/02/15--Status post IM nail and right wrist reduction -PT/OT-->CIR -DVT prophylaxis per orthopedics  -Pain control  Syncope -Likely vasovagal in the setting of volume depletion -Urinalysis negative for pyuria -Check orthostatics--neg -Echocardiogram--pending -Telemetry without concerning dysrhythmias  Alcohol abuse -continue CIWA -May '13 - EGD r/o varices > no varices seen, mild reflux esophagitis, gastric erosions   Thrombocytopenia -Secondary to liver cirrhosis/alcoholic liver disease -Check serum B12--287--start supplementation due to low normal -am CBC  COPD -Continue Spiriva -09/01/2015 chest x-ray negative for infiltrates -Ambulatory pulse oximetry on room air -oxygen sat 88-90% on RA  Tobacco abuse -Tobacco cessation discussed -NicoDerm patch deferred   Hyponatremia -Secondary to liver cirrhosis -Stable -am BMP  Hypomagnesemia -replete -recheck in am  Constipation -Continue  Colace -Add Senokot  Disposition Plan: CIR or SNF when cleared by ortho Family Communication: Wife updated at bedside 7/7  Consultants: Nickolas Madrid  Code Status: FULL  DVT Prophylaxis: [place] Lovenox   Procedures: As Listed in Progress Note Above  Antibiotics: None    Subjective: Patient complains of pain in the right wrist and right hip with activity. States that the pain is sharp in nature but is improved with opioids. No fevers, chills, chest pain, shortness breath, nausea, vomiting, diarrhea, abdominal pain. Complains of constipation.  Objective: Filed Vitals:   09/03/15 2021 09/04/15 0605 09/04/15 0818 09/04/15 1143  BP: 140/55 133/58  139/63  Pulse: 71 77  84  Temp: 98.3 F (36.8 C) 98.7 F (37.1 C)  99 F (37.2 C)  TempSrc:    Oral  Resp:  15    Height:      Weight:      SpO2: 92% 98% 94% 95%    Intake/Output Summary (Last 24 hours) at 09/04/15 1516 Last data filed at 09/04/15 1100  Gross per 24 hour  Intake    480 ml  Output    475 ml  Net      5 ml   Weight change:  Exam:   General:  Pt is alert, follows commands appropriately, not in acute distress  HEENT: No icterus, No thrush, No neck mass, [place]/AT  Cardiovascular: RRR, S1/S2, no rubs, no gallops  Respiratory: Bibasilar rales without wheezing. Good air movement.  Abdomen: Soft/+BS, non tender, non distended, no guarding  Extremities: No edema, No lymphangitis, No petechiae, No rashes, no synovitis   Data Reviewed: I have personally reviewed following labs and imaging studies Basic Metabolic Panel:  Recent Labs Lab 09/01/15 2020 09/02/15 1620 09/03/15 0621  NA 132*  --  131*  K 4.2  --  3.9  CL 95*  --  100*  CO2 30  --  25  GLUCOSE 101*  --  93  BUN 11  --  10  CREATININE 1.02 0.83 0.72  CALCIUM 8.9  --  8.4*  MG  --   --  1.6*  PHOS  --   --  2.9   Liver Function Tests:  Recent Labs Lab 09/01/15 2020  AST 31  ALT 22  ALKPHOS 68  BILITOT 1.5*  PROT 7.2   ALBUMIN 4.1   No results for input(s): LIPASE, AMYLASE in the last 168 hours. No results for input(s): AMMONIA in the last 168 hours. Coagulation Profile:  Recent Labs Lab 09/01/15 2020  INR 1.07   CBC:  Recent Labs Lab 09/01/15 2020 09/02/15 1620 09/03/15 0621  WBC 6.6 4.8 4.6  NEUTROABS 5.2  --   --   HGB 13.2 9.9* 9.8*  HCT 38.1* 28.7* 28.6*  MCV 97.4 98.3 97.9  PLT 154 104* 94*   Cardiac Enzymes: No results for input(s): CKTOTAL, CKMB, CKMBINDEX, TROPONINI in the last 168 hours. BNP: Invalid input(s): POCBNP CBG: No results for input(s): GLUCAP in the last 168 hours. HbA1C: No results for input(s): HGBA1C in the last 72 hours. Urine analysis:    Component Value Date/Time   COLORURINE AMBER* 09/02/2015 2135   APPEARANCEUR CLEAR 09/02/2015 2135   LABSPEC 1.016 09/02/2015 2135   PHURINE 6.0 09/02/2015 2135   GLUCOSEU NEGATIVE 09/02/2015 2135   HGBUR NEGATIVE 09/02/2015 2135   BILIRUBINUR NEGATIVE 09/02/2015 2135   KETONESUR NEGATIVE 09/02/2015 2135   PROTEINUR NEGATIVE 09/02/2015 2135   NITRITE NEGATIVE 09/02/2015 2135   LEUKOCYTESUR NEGATIVE 09/02/2015 2135   Sepsis Labs: '@LABRCNTIP'$ (procalcitonin:4,lacticidven:4) ) Recent Results (from the past 240 hour(s))  MRSA PCR Screening     Status: None   Collection Time: 09/02/15 12:49 AM  Result Value Ref Range Status   MRSA by PCR NEGATIVE NEGATIVE Final    Comment:        The GeneXpert MRSA Assay (FDA approved for NASAL specimens only), is one component of a comprehensive MRSA colonization surveillance program. It is not intended to diagnose MRSA infection nor to guide or monitor treatment for MRSA infections.      Scheduled Meds: . docusate sodium  100 mg Oral BID  . enoxaparin (LOVENOX) injection  40 mg Subcutaneous Q24H  . folic acid  1 mg Oral Daily  . LORazepam  0-4 mg Intravenous Q12H  . multivitamin with minerals  1 tablet Oral Daily  . thiamine  100 mg Oral Daily  . tiotropium  18 mcg  Inhalation Daily   Continuous Infusions: . sodium chloride 75 mL/hr at 09/04/15 0900    Procedures/Studies: Wrist 2 Views Right  09/02/2015  CLINICAL DATA:  Distal radial fracture, in plaster views EXAM: RIGHT WRIST - 2 VIEW COMPARISON:  Pre reduction images dated September 01, 2015 FINDINGS: The patient has undergone close reduction of the distal radial metaphyseal fracture. There remains mild angulation and impaction. The fracture line reaches the articular surface of the distal radius. The adjacent ulna is intact. IMPRESSION: Stable positioning of the comminuted angulated impacted fracture of the distal right radial metaphysis. The radiocarpal joint is involved. Electronically Signed   By: Phila Shoaf  [place] M.D.   On: 09/02/2015 10:52   Dg Wrist Complete Right  09/01/2015  CLINICAL DATA:  Fall in his kitchen after becoming dizzy after standing up. Right wrist pain and deformity. EXAM: RIGHT WRIST - COMPLETE 3+ VIEW COMPARISON:  None. FINDINGS: Impacted mildly comminuted and angulated distal radius fracture with mild apex volar angulation. Fracture extends to the distal radial ulnar joint. There is likely radiocarpal extension. Nondisplaced ulna styloid fracture. Associated soft tissue edema about the wrist IMPRESSION: Comminuted angulated impacted distal radius fracture extending into the distal radial ulnar and radiocarpal joints. Nondisplaced ulna styloid fracture. Electronically Signed   By: Jeb Levering M.D.   On: 09/01/2015 19:41   Dg Chest Port 1 View  09/01/2015  CLINICAL DATA:  Right hip fracture.  Preoperative examination. EXAM: PORTABLE CHEST 1 VIEW COMPARISON:  PA and lateral chest 02/22/2009. FINDINGS: The chest is somewhat hyperexpanded but the lungs are clear. Heart size is normal. No pneumothorax or pleural effusion. IMPRESSION: Pulmonary hyperexpansion suggestive of emphysema.  No acute disease. Electronically Signed   By: Inge Rise M.D.   On: 09/01/2015 20:27   Dg Hip Operative  Unilat W Or W/o Pelvis Right  09/02/2015  CLINICAL DATA:  ORIF. EXAM: OPERATIVE RIGHT HIP (WITH PELVIS IF PERFORMED) 3 VIEWS TECHNIQUE: Fluoroscopic spot image(s) were submitted for interpretation post-operatively. COMPARISON:  09/01/2015. FINDINGS: ORIF right intertrochanteric hip fracture. Good anatomic alignment. Hardware intact. Peripheral vascular calcification. IMPRESSION: 1.  ORIF right hip fracture good anatomic alignment. 2. Peripheral vascular disease . Electronically Signed   By: Marcello Moores  Register   On: 09/02/2015 08:51   Dg Hip Unilat With Pelvis 2-3 Views Left  08/05/2015  Radiology report Chief complaint x-ray evaluation of total hip prosthesis Postoperative x-rays status post LEFT  Total hip arthroplasty Findings:  A total hip prosthesis is noted. The femoral prosthesis does not show any loosening and is in acceptable alignment. The acetabular cup has appropriate abduction angle. There is no radiolucencies around the cup. There is no soft tissue calcification in the area of the abductor musculature Impression: Normal appearance of this total hip prosthesis   Dg Hip Unilat With Pelvis 2-3 Views Right  09/01/2015  CLINICAL DATA:  Right hip pain after fall today. Fall after becoming dizzy and his kitchen. EXAM: DG HIP (WITH OR WITHOUT PELVIS) 2-3V RIGHT COMPARISON:  Left hip radiographs 06/11/2015 FINDINGS: Mildly displaced minimally comminuted right hip intertrochanteric fracture. Femoral head remains seated in the acetabulum. 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 Pubic symphysis and sacroiliac joints remain congruent. Left hip prosthesis partially included. Atherosclerotic calcifications are seen. IMPRESSION: Mildly displaced right hip intertrochanteric fracture. Electronically Signed   By: Jeb Levering M.D.   On: 09/01/2015 19:44    Tannisha Kennington, DO  [facility] Pager [phone]  If 7PM-7AM, please contact night-coverage [email or web address] Password [email or web address] 09/04/2015,  3:16 PM   LOS: 3 days

## 2015-09-05 ENCOUNTER — Other Ambulatory Visit (HOSPITAL_COMMUNITY): Payer: PPO

## 2015-09-05 DIAGNOSIS — I10 Essential (primary) hypertension: Secondary | ICD-10-CM | POA: Diagnosis not present

## 2015-09-05 DIAGNOSIS — F1010 Alcohol abuse, uncomplicated: Secondary | ICD-10-CM | POA: Diagnosis not present

## 2015-09-05 DIAGNOSIS — S72141G Displaced intertrochanteric fracture of right femur, subsequent encounter for closed fracture with delayed healing: Secondary | ICD-10-CM | POA: Diagnosis not present

## 2015-09-05 DIAGNOSIS — Z720 Tobacco use: Secondary | ICD-10-CM | POA: Diagnosis not present

## 2015-09-05 DIAGNOSIS — S52501G Unspecified fracture of the lower end of right radius, subsequent encounter for closed fracture with delayed healing: Secondary | ICD-10-CM | POA: Diagnosis not present

## 2015-09-05 DIAGNOSIS — S72141A Displaced intertrochanteric fracture of right femur, initial encounter for closed fracture: Secondary | ICD-10-CM | POA: Diagnosis not present

## 2015-09-05 DIAGNOSIS — E871 Hypo-osmolality and hyponatremia: Secondary | ICD-10-CM | POA: Diagnosis not present

## 2015-09-05 DIAGNOSIS — K7460 Unspecified cirrhosis of liver: Secondary | ICD-10-CM | POA: Diagnosis not present

## 2015-09-05 DIAGNOSIS — S52501D Unspecified fracture of the lower end of right radius, subsequent encounter for closed fracture with routine healing: Secondary | ICD-10-CM | POA: Diagnosis not present

## 2015-09-05 DIAGNOSIS — J438 Other emphysema: Secondary | ICD-10-CM | POA: Diagnosis not present

## 2015-09-05 LAB — CBC
HCT: 24.8 % — ABNORMAL LOW (ref 39.0–52.0)
Hemoglobin: 8.5 g/dL — ABNORMAL LOW (ref 13.0–17.0)
MCH: 33.6 pg (ref 26.0–34.0)
MCHC: 34.3 g/dL (ref 30.0–36.0)
MCV: 98 fL (ref 78.0–100.0)
Platelets: 121 10*3/uL — ABNORMAL LOW (ref 150–400)
RBC: 2.53 MIL/uL — ABNORMAL LOW (ref 4.22–5.81)
RDW: 14.3 % (ref 11.5–15.5)
WBC: 3.6 10*3/uL — ABNORMAL LOW (ref 4.0–10.5)

## 2015-09-05 LAB — BASIC METABOLIC PANEL
Anion gap: 7 (ref 5–15)
BUN: 10 mg/dL (ref 6–20)
CO2: 26 mmol/L (ref 22–32)
Calcium: 8.2 mg/dL — ABNORMAL LOW (ref 8.9–10.3)
Chloride: 100 mmol/L — ABNORMAL LOW (ref 101–111)
Creatinine, Ser: 0.71 mg/dL (ref 0.61–1.24)
GFR calc Af Amer: >60 mL/min (ref >60)
GFR calc non Af Amer: >60 mL/min (ref >60)
Glucose, Bld: 108 mg/dL — ABNORMAL HIGH (ref 65–99)
Potassium: 3.5 mmol/L (ref 3.5–5.1)
Sodium: 133 mmol/L — ABNORMAL LOW (ref 135–145)

## 2015-09-05 MED ORDER — DOCUSATE SODIUM 100 MG PO CAPS: 100.0000 mg | ORAL_CAPSULE | Freq: Two times a day (BID) | ORAL | Status: DC

## 2015-09-05 MED ORDER — CYANOCOBALAMIN 500 MCG PO TABS: 500.0000 ug | ORAL_TABLET | Freq: Every day | ORAL | Status: DC

## 2015-09-05 MED ORDER — SENNA 8.6 MG PO TABS: 2.0000 | ORAL_TABLET | Freq: Every day | ORAL | Status: DC

## 2015-09-05 MED ORDER — ALPRAZOLAM 0.5 MG PO TABS: 0.2500 mg | ORAL_TABLET | Freq: Four times a day (QID) | ORAL | Status: DC | PRN

## 2015-09-05 MED ORDER — BISACODYL 10 MG RE SUPP: 10.0000 mg | Freq: Once | RECTAL | Status: DC

## 2015-09-05 NOTE — Progress Notes (Signed)
Subjective: 3 Days Post-Op Procedure(s) (LRB): RIGHT  INTERTROCHANTRIC HIP (Right) RIGHT WRIST REDUCTION (Right) Patient reports pain as moderate, but overall doing much better today  Objective: Vital signs in last 24 hours: Temp:  [98 F (36.7 C)-99 F (37.2 C)] 98.7 F (37.1 C) (07/09 0527) Pulse Rate:  [76-91] 91 (07/09 0527) Resp:  [16] 16 (07/09 0527) BP: (121-157)/(43-65) 157/65 mmHg (07/09 0527) SpO2:  [90 %-100 %] 95 % (07/09 0527)  Intake/Output from previous day: 07/08 0701 - 07/09 0700 In: 4450 [I.V.:4400; IV Piggyback:50] Out: 800 [Urine:800] Intake/Output this shift: Total I/O In: 240 [P.O.:240] Out: -    Recent Labs  09/02/15 1620 09/03/15 0621 09/05/15 0602  HGB 9.9* 9.8* 8.5*    Recent Labs  09/03/15 0621 09/05/15 0602  WBC 4.6 3.6*  RBC 2.92* 2.53*  HCT 28.6* 24.8*  PLT 94* 121*    Recent Labs  09/03/15 0621 09/05/15 0602  NA 131* 133*  K 3.9 3.5  CL 100* 100*  CO2 25 26  BUN 10 10  CREATININE 0.72 0.71  GLUCOSE 93 108*  CALCIUM 8.4* 8.2*   No results for input(s): LABPT, INR in the last 72 hours.  Neurologically intact Neurovascular intact Sensation intact distally Intact pulses distally Dorsiflexion/Plantar flexion intact Incision: dressing C/D/I No cellulitis present Compartment soft  Right hand/fingers swollen RUE splint in place and well positioned  Assessment/Plan: 3 Days Post-Op Procedure(s) (LRB): RIGHT  INTERTROCHANTRIC HIP (Right) RIGHT WRIST REDUCTION (Right) Advance diet Up with therapy  WBAT RLE NWB RUE-ice and elevate ABLA-mild and stable Dry dressing change prn Continue plan per medicine  Ian Aguilar 09/05/2015, 10:45 AM

## 2015-09-05 NOTE — Progress Notes (Signed)
PROGRESS NOTE  Ian Aguilar VZC:[id number] DOB: 11-21-1940 DOA: 09/01/2015 PCP: Purvis Kilts, MD  Brief History:  75 year old male with a history of liver cirrhosis, alcohol and tobacco dependence, COPD, hyperlipidemia presented with syncope resulting in a fall. Unfortunately, the fall resulted in a right hip intertrochanteric fracture as well as a comminuted impacted distal radial fracture extending into the distal radial ulnar and radial carpal joints on the right. There was also a nondisplaced styloid fracture. The patient was at home drinking beer when he felt dizzy after standing up resulting in syncopal episode. The patient states that he drinks 4-6 beers per day. He continues to smoke one half pack per day x 60 years. Ortho was consulted and Dr. [name] for the patient to the operating room on 09/02/2015 for right intertrochanteric hip pain as well as right wrist reduction.  Assessment/Plan: Right intertrochanteric hip fracture/right wrist fracture -09/02/15--Status post IM nail and right wrist reduction -PT/OT-->CIR -DVT prophylaxis per orthopedics  -Pain control -WBAT RLE -NWB RUE-ice and elevate  Syncope -Likely vasovagal in the setting of volume depletion -Urinalysis negative for pyuria -Check orthostatics--neg -Echocardiogram--pending -Telemetry without concerning dysrhythmias  Alcohol abuse -continue CIWA--no withdrawl -May '13 - EGD r/o varices > no varices seen, mild reflux esophagitis, gastric erosions   Thrombocytopenia -Secondary to liver cirrhosis/alcoholic liver disease -Check serum B12--287--start supplementation due to low normal -am CBC  COPD -Continue Spiriva -09/01/2015 chest x-ray negative for infiltrates -Ambulatory pulse oximetry on room air -oxygen sat 88-90% on RA  Tobacco abuse -Tobacco cessation discussed -NicoDerm patch deferred   Hyponatremia -Secondary to liver  cirrhosis -Stable  Hypomagnesemia -repleted   Constipation -Continue Colace -Add Senokot  Disposition Plan: CIR or SNF  09/06/15 Family Communication: Wife updated at bedside 7/9  Consultants: Nickolas Madrid  Code Status: FULL  DVT Prophylaxis:  Lovenox   Procedures: As Listed in Progress Note Above  Antibiotics: None   Subjective: Patient had a bowel movement this afternoon. He states that his pain is a little bit better but still has some pain with any kind of activity. Denies fevers, chills, chest pain, shortness breath, nausea, vomiting, diarrhea. No abdominal pain.  Objective: Filed Vitals:   09/04/15 1536 09/04/15 2111 09/05/15 0527 09/05/15 1303  BP: 121/43 128/53 157/65 100/57  Pulse: 78 76 91 73  Temp: 98 F (36.7 C) 98.6 F (37 C) 98.7 F (37.1 C) 97.8 F (36.6 C)  TempSrc: Oral Oral Oral Oral  Resp:  '16 16 18  '$ Height:      Weight:      SpO2: 90% 100% 95% 97%    Intake/Output Summary (Last 24 hours) at 09/05/15 1723 Last data filed at 09/05/15 1219  Gross per 24 hour  Intake   1260 ml  Output    450 ml  Net    810 ml   Weight change:  Exam:   General:  Pt is alert, follows commands appropriately, not in acute distress  HEENT: No icterus, No thrush, No neck mass, [place]/AT  Cardiovascular: RRR, S1/S2, no rubs, no gallops  Respiratory: Diminished breath sounds at the bases but clear to auscultation.  Abdomen: Soft/+BS, non tender, non distended, no guarding  Extremities: trace LE edema, No lymphangitis, No petechiae, No rashes, no synovitis   Data Reviewed: I have personally reviewed following labs and imaging studies Basic Metabolic Panel:  Recent Labs Lab 09/01/15 2020 09/02/15 1620 09/03/15 0621 09/05/15 0602  NA 132*  --  131* 133*  K 4.2  --  3.9 3.5  CL 95*  --  100* 100*  CO2 30  --  25 26  GLUCOSE 101*  --  93 108*  BUN 11  --  10 10  CREATININE 1.02 0.83 0.72 0.71  CALCIUM 8.9  --  8.4* 8.2*  MG  --    --  1.6*  --   PHOS  --   --  2.9  --    Liver Function Tests:  Recent Labs Lab 09/01/15 2020  AST 31  ALT 22  ALKPHOS 68  BILITOT 1.5*  PROT 7.2  ALBUMIN 4.1   No results for input(s): LIPASE, AMYLASE in the last 168 hours. No results for input(s): AMMONIA in the last 168 hours. Coagulation Profile:  Recent Labs Lab 09/01/15 2020  INR 1.07   CBC:  Recent Labs Lab 09/01/15 2020 09/02/15 1620 09/03/15 0621 09/05/15 0602  WBC 6.6 4.8 4.6 3.6*  NEUTROABS 5.2  --   --   --   HGB 13.2 9.9* 9.8* 8.5*  HCT 38.1* 28.7* 28.6* 24.8*  MCV 97.4 98.3 97.9 98.0  PLT 154 104* 94* 121*   Cardiac Enzymes: No results for input(s): CKTOTAL, CKMB, CKMBINDEX, TROPONINI in the last 168 hours. BNP: Invalid input(s): POCBNP CBG: No results for input(s): GLUCAP in the last 168 hours. HbA1C: No results for input(s): HGBA1C in the last 72 hours. Urine analysis:    Component Value Date/Time   COLORURINE AMBER* 09/02/2015 2135   APPEARANCEUR CLEAR 09/02/2015 2135   LABSPEC 1.016 09/02/2015 2135   PHURINE 6.0 09/02/2015 2135   GLUCOSEU NEGATIVE 09/02/2015 2135   HGBUR NEGATIVE 09/02/2015 2135   BILIRUBINUR NEGATIVE 09/02/2015 2135   KETONESUR NEGATIVE 09/02/2015 2135   PROTEINUR NEGATIVE 09/02/2015 2135   NITRITE NEGATIVE 09/02/2015 2135   LEUKOCYTESUR NEGATIVE 09/02/2015 2135   Sepsis Labs: '@LABRCNTIP'$ (procalcitonin:4,lacticidven:4) ) Recent Results (from the past 240 hour(s))  MRSA PCR Screening     Status: None   Collection Time: 09/02/15 12:49 AM  Result Value Ref Range Status   MRSA by PCR NEGATIVE NEGATIVE Final    Comment:        The GeneXpert MRSA Assay (FDA approved for NASAL specimens only), is one component of a comprehensive MRSA colonization surveillance program. It is not intended to diagnose MRSA infection nor to guide or monitor treatment for MRSA infections.      Scheduled Meds: . bisacodyl  10 mg Rectal Once  . docusate sodium  100 mg Oral BID   . enoxaparin (LOVENOX) injection  40 mg Subcutaneous Q24H  . folic acid  1 mg Oral Daily  . LORazepam  0-4 mg Intravenous Q12H  . multivitamin with minerals  1 tablet Oral Daily  . senna  2 tablet Oral Daily  . thiamine  100 mg Oral Daily  . tiotropium  18 mcg Inhalation Daily  . vitamin B-12  500 mcg Oral Daily   Continuous Infusions: . sodium chloride 75 mL/hr at 09/05/15 1221    Procedures/Studies: Wrist 2 Views Right  09/02/2015  CLINICAL DATA:  Distal radial fracture, in plaster views EXAM: RIGHT WRIST - 2 VIEW COMPARISON:  Pre reduction images dated September 01, 2015 FINDINGS: The patient has undergone close reduction of the distal radial metaphyseal fracture. There remains mild angulation and impaction. The fracture line reaches the articular surface of the distal radius. The adjacent ulna is intact. IMPRESSION: Stable positioning of the comminuted angulated impacted fracture of the distal right radial  metaphysis. The radiocarpal joint is involved. Electronically Signed   By: Jerral Mccauley  [place] M.D.   On: 09/02/2015 10:52   Dg Wrist Complete Right  09/01/2015  CLINICAL DATA:  Fall in his kitchen after becoming dizzy after standing up. Right wrist pain and deformity. EXAM: RIGHT WRIST - COMPLETE 3+ VIEW COMPARISON:  None. FINDINGS: Impacted mildly comminuted and angulated distal radius fracture with mild apex volar angulation. Fracture extends to the distal radial ulnar joint. There is likely radiocarpal extension. Nondisplaced ulna styloid fracture. Associated soft tissue edema about the wrist IMPRESSION: Comminuted angulated impacted distal radius fracture extending into the distal radial ulnar and radiocarpal joints. Nondisplaced ulna styloid fracture. Electronically Signed   By: Jeb Levering M.D.   On: 09/01/2015 19:41   Dg Chest Port 1 View  09/01/2015  CLINICAL DATA:  Right hip fracture.  Preoperative examination. EXAM: PORTABLE CHEST 1 VIEW COMPARISON:  PA and lateral chest 02/22/2009.  FINDINGS: The chest is somewhat hyperexpanded but the lungs are clear. Heart size is normal. No pneumothorax or pleural effusion. IMPRESSION: Pulmonary hyperexpansion suggestive of emphysema.  No acute disease. Electronically Signed   By: Inge Rise M.D.   On: 09/01/2015 20:27   Dg Hip Operative Unilat W Or W/o Pelvis Right  09/02/2015  CLINICAL DATA:  ORIF. EXAM: OPERATIVE RIGHT HIP (WITH PELVIS IF PERFORMED) 3 VIEWS TECHNIQUE: Fluoroscopic spot image(s) were submitted for interpretation post-operatively. COMPARISON:  09/01/2015. FINDINGS: ORIF right intertrochanteric hip fracture. Good anatomic alignment. Hardware intact. Peripheral vascular calcification. IMPRESSION: 1.  ORIF right hip fracture good anatomic alignment. 2. Peripheral vascular disease . Electronically Signed   By: Marcello Moores  Register   On: 09/02/2015 08:51   Dg Hip Unilat With Pelvis 2-3 Views Right  09/01/2015  CLINICAL DATA:  Right hip pain after fall today. Fall after becoming dizzy and his kitchen. EXAM: DG HIP (WITH OR WITHOUT PELVIS) 2-3V RIGHT COMPARISON:  Left hip radiographs 06/11/2015 FINDINGS: Mildly displaced minimally comminuted right hip intertrochanteric fracture. Femoral head remains seated in the acetabulum. 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 Pubic symphysis and sacroiliac joints remain congruent. Left hip prosthesis partially included. Atherosclerotic calcifications are seen. IMPRESSION: Mildly displaced right hip intertrochanteric fracture. Electronically Signed   By: Jeb Levering M.D.   On: 09/01/2015 19:44    Fatin Bachicha, DO  [facility] Pager [phone]  If 7PM-7AM, please contact night-coverage [email or web address] Password [email or web address] 09/05/2015, 5:23 PM   LOS: 4 days

## 2015-09-05 NOTE — Discharge Summary (Addendum)
Physician Discharge Summary  Ian Aguilar LFY:[id number] DOB: 08-Jan-1941 DOA: 09/01/2015  PCP: Purvis Kilts, MD  Admit date: 09/01/2015 Discharge date: 09/06/15  Admitted From: Home Disposition:  SNF  Recommendations for Outpatient Follow-up:  1. Follow up with PCP in 1-2 weeks 2. Please obtain BMP/CBC in one week 3. Please maintain patient on 2L [place] and wean for oxygen saturation >92%    Discharge Condition: stable CODE STATUS: FULL Diet recommendation: Heart Healthy  Brief/Interim Summary: 75 year old male with a history of liver cirrhosis, alcohol and tobacco dependence, COPD, hyperlipidemia presented with syncope resulting in a fall. Unfortunately, the fall resulted in a right hip intertrochanteric fracture as well as a comminuted impacted distal radial fracture extending into the distal radial ulnar and radial carpal joints on the right. There was also a nondisplaced styloid fracture. The patient was at home drinking beer when he felt dizzy after standing up resulting in syncopal episode. The patient states that he drinks 4-6 beers per day. He continues to smoke one half pack per day x 60 years. Ortho was consulted and Dr. [name] for the patient to the operating room on 09/02/2015 for right intertrochanteric hip pain as well as right wrist reduction.  Discharge Diagnoses:  Right intertrochanteric hip fracture/right wrist fracture -09/02/15--Status post IM nail and right wrist reduction -PT/OT-->CIR -DVT prophylaxis per orthopedics-->ASA 325 mg daily but pt did not want to take ASA so switched to lovenox 40 mg [place] daily -will need to monitor platelets on lovenox -check cbc in one week -Pain control -WBAT RLE -NWB RUE-ice and elevate  Syncope -Likely vasovagal in the setting of volume depletion -Urinalysis negative for pyuria -Check orthostatics--neg -Echocardiogram--EF 60-65%, grade 2 DD, no WMA, no major valvular abnormalities -Telemetry without concerning  dysrhythmias  Alcohol abuse -continue CIWA--no withdrawl -May '13 - EGD r/o varices > no varices seen, mild reflux esophagitis, gastric erosions   Thrombocytopenia -Secondary to liver cirrhosis/alcoholic liver disease -Check serum B12--287--start supplementation due to low normal -remained stable  COPD -Continue Spiriva -09/01/2015 chest x-ray negative for infiltrates -Ambulatory pulse oximetry on room air -oxygen sat 88-90% on RA  Tobacco abuse -Tobacco cessation discussed -NicoDerm patch deferred   Hyponatremia -Secondary to liver cirrhosis -Stable  Hypomagnesemia -repleted  Constipation -Continue Colace -Add Senokot-->+BM   Discharge Instructions     Medication List    STOP taking these medications        ibuprofen 200 MG tablet  Commonly known as:  ADVIL,MOTRIN     oxyCODONE-acetaminophen 5-325 MG tablet  Commonly known as:  PERCOCET/ROXICET      TAKE these medications        ALPRAZolam 0.5 MG tablet  Commonly known as:  XANAX  Take 0.5 tablets (0.25 mg total) by mouth every 6 (six) hours as needed for anxiety or sleep. Reported on 08/05/2015     cyanocobalamin 500 MCG tablet  Take 1 tablet (500 mcg total) by mouth daily.     docusate sodium 100 MG capsule  Commonly known as:  COLACE  Take 1 capsule (100 mg total) by mouth 2 (two) times daily.     enoxaparin 40 MG/0.4ML injection  Commonly known as:  LOVENOX  Inject 0.4 mLs (40 mg total) into the skin daily.     HYDROcodone-acetaminophen 5-325 MG tablet  Commonly known as:  NORCO  Take 1-2 tablets by mouth every 4 (four) hours as needed for moderate pain.     ondansetron 4 MG tablet  Commonly known as:  ZOFRAN  Take 1 tablet (4 mg total) by mouth every 8 (eight) hours as needed for nausea or vomiting.     senna 8.6 MG Tabs tablet  Commonly known as:  SENOKOT  Take 2 tablets (17.2 mg total) by mouth daily.     tiotropium 18 MCG inhalation capsule  Commonly known as:  SPIRIVA  Place 18  mcg into inhaler and inhale every morning.     VITAMIN D PO  Take 1 capsule by mouth every morning.       Follow-up Information    Follow up with MURPHY, TIMOTHY D, MD In 10 days.   Specialty:  Orthopedic Surgery   Contact information:   [address] [place] [id number] [phone]      No Known Allergies  Consultations:  Ortho   Procedures/Studies: Wrist 2 Views Right  09/02/2015  CLINICAL DATA:  Distal radial fracture, in plaster views EXAM: RIGHT WRIST - 2 VIEW COMPARISON:  Pre reduction images dated September 01, 2015 FINDINGS: The patient has undergone close reduction of the distal radial metaphyseal fracture. There remains mild angulation and impaction. The fracture line reaches the articular surface of the distal radius. The adjacent ulna is intact. IMPRESSION: Stable positioning of the comminuted angulated impacted fracture of the distal right radial metaphysis. The radiocarpal joint is involved. Electronically Signed   By: Sobia Karger  [place] M.D.   On: 09/02/2015 10:52   Dg Wrist Complete Right  09/01/2015  CLINICAL DATA:  Fall in his kitchen after becoming dizzy after standing up. Right wrist pain and deformity. EXAM: RIGHT WRIST - COMPLETE 3+ VIEW COMPARISON:  None. FINDINGS: Impacted mildly comminuted and angulated distal radius fracture with mild apex volar angulation. Fracture extends to the distal radial ulnar joint. There is likely radiocarpal extension. Nondisplaced ulna styloid fracture. Associated soft tissue edema about the wrist IMPRESSION: Comminuted angulated impacted distal radius fracture extending into the distal radial ulnar and radiocarpal joints. Nondisplaced ulna styloid fracture. Electronically Signed   By: Jeb Levering M.D.   On: 09/01/2015 19:41   Dg Chest Port 1 View  09/01/2015  CLINICAL DATA:  Right hip fracture.  Preoperative examination. EXAM: PORTABLE CHEST 1 VIEW COMPARISON:  PA and lateral chest 02/22/2009. FINDINGS: The chest is  somewhat hyperexpanded but the lungs are clear. Heart size is normal. No pneumothorax or pleural effusion. IMPRESSION: Pulmonary hyperexpansion suggestive of emphysema.  No acute disease. Electronically Signed   By: Inge Rise M.D.   On: 09/01/2015 20:27   Dg Hip Operative Unilat W Or W/o Pelvis Right  09/02/2015  CLINICAL DATA:  ORIF. EXAM: OPERATIVE RIGHT HIP (WITH PELVIS IF PERFORMED) 3 VIEWS TECHNIQUE: Fluoroscopic spot image(s) were submitted for interpretation post-operatively. COMPARISON:  09/01/2015. FINDINGS: ORIF right intertrochanteric hip fracture. Good anatomic alignment. Hardware intact. Peripheral vascular calcification. IMPRESSION: 1.  ORIF right hip fracture good anatomic alignment. 2. Peripheral vascular disease . Electronically Signed   By: Marcello Moores  Register   On: 09/02/2015 08:51   Dg Hip Unilat With Pelvis 2-3 Views Right  09/01/2015  CLINICAL DATA:  Right hip pain after fall today. Fall after becoming dizzy and his kitchen. EXAM: DG HIP (WITH OR WITHOUT PELVIS) 2-3V RIGHT COMPARISON:  Left hip radiographs 06/11/2015 FINDINGS: Mildly displaced minimally comminuted right hip intertrochanteric fracture. Femoral head remains seated in the acetabulum. 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 Pubic symphysis and sacroiliac joints remain congruent. Left hip prosthesis partially included. Atherosclerotic calcifications are seen. IMPRESSION: Mildly displaced right hip intertrochanteric  fracture. Electronically Signed   By: Jeb Levering M.D.   On: 09/01/2015 19:44        Discharge Exam: Filed Vitals:   09/05/15 2105 09/06/15 0636  BP: 130/49 168/73  Pulse: 72 100  Temp: 98.5 F (36.9 C) 99.9 F (37.7 C)  Resp: 16 16   Filed Vitals:   09/05/15 1303 09/05/15 2105 09/06/15 0636 09/06/15 0933  BP: 100/57 130/49 168/73   Pulse: 73 72 100   Temp: 97.8 F (36.6 C) 98.5 F (36.9 C) 99.9 F (37.7 C)   TempSrc: Oral Oral Oral   Resp: '18 16 16   '$ Height:        Weight:      SpO2: 97% 99% 94% 95%    General: Pt is alert, awake, not in acute distress Cardiovascular: RRR, S1/S2 +, no rubs, no gallops Respiratory:diminished breath sounds at bases without wheeze. Abdominal: Soft, NT, ND, bowel sounds + Extremities: trace LE edema, no cyanosis   The results of significant diagnostics from this hospitalization (including imaging, microbiology, ancillary and laboratory) are listed below for reference.    Significant Diagnostic Studies: Wrist 2 Views Right  09/02/2015  CLINICAL DATA:  Distal radial fracture, in plaster views EXAM: RIGHT WRIST - 2 VIEW COMPARISON:  Pre reduction images dated September 01, 2015 FINDINGS: The patient has undergone close reduction of the distal radial metaphyseal fracture. There remains mild angulation and impaction. The fracture line reaches the articular surface of the distal radius. The adjacent ulna is intact. IMPRESSION: Stable positioning of the comminuted angulated impacted fracture of the distal right radial metaphysis. The radiocarpal joint is involved. Electronically Signed   By: Aliany Fiorenza  [place] M.D.   On: 09/02/2015 10:52   Dg Wrist Complete Right  09/01/2015  CLINICAL DATA:  Fall in his kitchen after becoming dizzy after standing up. Right wrist pain and deformity. EXAM: RIGHT WRIST - COMPLETE 3+ VIEW COMPARISON:  None. FINDINGS: Impacted mildly comminuted and angulated distal radius fracture with mild apex volar angulation. Fracture extends to the distal radial ulnar joint. There is likely radiocarpal extension. Nondisplaced ulna styloid fracture. Associated soft tissue edema about the wrist IMPRESSION: Comminuted angulated impacted distal radius fracture extending into the distal radial ulnar and radiocarpal joints. Nondisplaced ulna styloid fracture. Electronically Signed   By: Jeb Levering M.D.   On: 09/01/2015 19:41   Dg Chest Port 1 View  09/01/2015  CLINICAL DATA:  Right hip fracture.  Preoperative examination. EXAM:  PORTABLE CHEST 1 VIEW COMPARISON:  PA and lateral chest 02/22/2009. FINDINGS: The chest is somewhat hyperexpanded but the lungs are clear. Heart size is normal. No pneumothorax or pleural effusion. IMPRESSION: Pulmonary hyperexpansion suggestive of emphysema.  No acute disease. Electronically Signed   By: Inge Rise M.D.   On: 09/01/2015 20:27   Dg Hip Operative Unilat W Or W/o Pelvis Right  09/02/2015  CLINICAL DATA:  ORIF. EXAM: OPERATIVE RIGHT HIP (WITH PELVIS IF PERFORMED) 3 VIEWS TECHNIQUE: Fluoroscopic spot image(s) were submitted for interpretation post-operatively. COMPARISON:  09/01/2015. FINDINGS: ORIF right intertrochanteric hip fracture. Good anatomic alignment. Hardware intact. Peripheral vascular calcification. IMPRESSION: 1.  ORIF right hip fracture good anatomic alignment. 2. Peripheral vascular disease . Electronically Signed   By: Marcello Moores  Register   On: 09/02/2015 08:51   Dg Hip Unilat With Pelvis 2-3 Views Right  09/01/2015  CLINICAL DATA:  Right hip pain after fall today. Fall after becoming dizzy and his kitchen. EXAM: DG HIP (WITH OR WITHOUT PELVIS) 2-3V RIGHT COMPARISON:  Left hip radiographs 06/11/2015 FINDINGS: Mildly displaced minimally comminuted right hip intertrochanteric fracture. Femoral head remains seated in the acetabulum. 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 Pubic symphysis and sacroiliac joints remain congruent. Left hip prosthesis partially included. Atherosclerotic calcifications are seen. IMPRESSION: Mildly displaced right hip intertrochanteric fracture. Electronically Signed   By: Jeb Levering M.D.   On: 09/01/2015 19:44     Microbiology: Recent Results (from the past 240 hour(s))  MRSA PCR Screening     Status: None   Collection Time: 09/02/15 12:49 AM  Result Value Ref Range Status   MRSA by PCR NEGATIVE NEGATIVE Final    Comment:        The GeneXpert MRSA Assay (FDA approved for NASAL specimens only), is one component of  a comprehensive MRSA colonization surveillance program. It is not intended to diagnose MRSA infection nor to guide or monitor treatment for MRSA infections.      Labs: Basic Metabolic Panel:  Recent Labs Lab 09/01/15 2020 09/02/15 1620 09/03/15 0621 09/05/15 0602  NA 132*  --  131* 133*  K 4.2  --  3.9 3.5  CL 95*  --  100* 100*  CO2 30  --  25 26  GLUCOSE 101*  --  93 108*  BUN 11  --  10 10  CREATININE 1.02 0.83 0.72 0.71  CALCIUM 8.9  --  8.4* 8.2*  MG  --   --  1.6*  --   PHOS  --   --  2.9  --    Liver Function Tests:  Recent Labs Lab 09/01/15 2020  AST 31  ALT 22  ALKPHOS 68  BILITOT 1.5*  PROT 7.2  ALBUMIN 4.1   No results for input(s): LIPASE, AMYLASE in the last 168 hours. No results for input(s): AMMONIA in the last 168 hours. CBC:  Recent Labs Lab 09/01/15 2020 09/02/15 1620 09/03/15 0621 09/05/15 0602 09/06/15 0344  WBC 6.6 4.8 4.6 3.6* 4.8  NEUTROABS 5.2  --   --   --   --   HGB 13.2 9.9* 9.8* 8.5* 9.8*  HCT 38.1* 28.7* 28.6* 24.8* 28.7*  MCV 97.4 98.3 97.9 98.0 97.0  PLT 154 104* 94* 121* 89*   Cardiac Enzymes: No results for input(s): CKTOTAL, CKMB, CKMBINDEX, TROPONINI in the last 168 hours. BNP: Invalid input(s): POCBNP CBG: No results for input(s): GLUCAP in the last 168 hours.  Time coordinating discharge:  Greater than 30 minutes  Signed:  Jonalyn Sedlak, DO [facility] Pager: [phone] 09/06/2015, 3:04 PM

## 2015-09-05 NOTE — Progress Notes (Signed)
Occupational Therapy Treatment Patient Details Name: Ian Aguilar MRN: [id number] DOB: 1941-02-23 Today's Date: 09/05/2015    History of present illness 75 y.o. male with a fall and subsequent Rt intertrochanteric hip fracture and Rt wrist fx. Pt is now s/p RIGHT INTERTROCHANTRIC HIP nail, RIGHT WRIST REDUCTION. The pt had a previous fall with Lt periprosthetic fracture in April managed nonoperatively PMH: left THA 2013 , COPD, tobacco abuse, and cirrhosis secondary to alcohol abuse, anxiety, depression.    OT comments  Pt progressing towards acute OT goals. Focus of session was bed mobility and functional transfers completed as SPT +2 min-mod A. Pt tolerated session well. D/c plan remains appropriate.    Follow Up Recommendations  CIR;Supervision/Assistance - 24 hour    Equipment Recommendations  Other (comment) (TBD next venue)    Recommendations for Other Services      Precautions / Restrictions Precautions Precautions: Fall Restrictions Weight Bearing Restrictions: Yes RLE Weight Bearing: Weight bearing as tolerated Other Position/Activity Restrictions: recent ortho note states NWB RUE, not in order set       Mobility Bed Mobility Overal bed mobility: Needs Assistance Bed Mobility: Supine to Sit     Supine to sit: Mod assist;+2 for safety/equipment     General bed mobility comments: Assist to advance RLE, powerup trunkand to advance hips to EOB position. Cued to use LUE on bed rail and to not use RUE as support/assist during bed mobility.   Transfers Overall transfer level: Needs assistance Equipment used: Right platform walker Transfers: Stand Pivot Transfers;Sit to/from Stand Sit to Stand: +2 physical assistance;Mod assist Stand pivot transfers: Min assist;+2 physical assistance       General transfer comment: cues for hand placement and assist for powerup, EOB>recliner    Balance Overall balance assessment: Needs assistance Sitting-balance support: No  upper extremity supported;Feet supported Sitting balance-Leahy Scale: Good     Standing balance support: Bilateral upper extremity supported Standing balance-Leahy Scale: Poor                     ADL                                         General ADL Comments: Pt completed bed mobility and SPT to recliner, Cues needed to adhere to NWB RUE throughout session.      Vision                     Perception     Praxis      Cognition   Behavior During Therapy: WFL for tasks assessed/performed Overall Cognitive Status: Within Functional Limits for tasks assessed                       Extremity/Trunk Assessment               Exercises     Shoulder Instructions       General Comments      Pertinent Vitals/ Pain       Pain Assessment: Faces Faces Pain Scale: Hurts little more Pain Location: R hip>RUE Pain Descriptors / Indicators: Grimacing Pain Intervention(s): Limited activity within patient's tolerance;Monitored during session;Repositioned  Home Living  Prior Functioning/Environment              Frequency Min 2X/week     Progress Toward Goals  OT Goals(current goals can now be found in the care plan section)  Progress towards OT goals: Progressing toward goals  Acute Rehab OT Goals Patient Stated Goal: move without pain OT Goal Formulation: With patient Time For Goal Achievement: 09/10/15 Potential to Achieve Goals: Good ADL Goals Pt Will Perform Grooming: with min guard assist;with supervision;with set-up;sitting Pt Will Perform Upper Body Bathing: with min guard assist;with supervision;with set-up;sitting Pt Will Perform Lower Body Bathing: with mod assist;sitting/lateral leans Pt Will Perform Upper Body Dressing: with min guard assist;with supervision;with set-up;sitting Pt Will Transfer to Toilet: with mod assist;bedside commode;with min assist   Plan Discharge plan remains appropriate    Co-evaluation                 End of Session Equipment Utilized During Treatment: Gait belt;Other (comment) (platform rw )   Activity Tolerance Patient tolerated treatment well   Patient Left in chair;with call bell/phone within reach   Nurse Communication          Time: 4163-8453 OT Time Calculation (min): 21 min  Charges: OT General Charges $OT Visit: 1 Procedure OT Treatments $Self Care/Home Management : 8-22 mins  Hortencia Pilar 09/05/2015, 10:39 AM

## 2015-09-06 ENCOUNTER — Inpatient Hospital Stay
Admission: RE | Admit: 2015-09-06 | Discharge: 2015-10-19 | Disposition: A | Discharge status: Home / Self Care | Payer: PPO | Source: Ambulatory Visit | Attending: Internal Medicine | Admitting: Internal Medicine

## 2015-09-06 ENCOUNTER — Inpatient Hospital Stay (HOSPITAL_COMMUNITY): Payer: PPO

## 2015-09-06 DIAGNOSIS — E871 Hypo-osmolality and hyponatremia: Secondary | ICD-10-CM | POA: Diagnosis not present

## 2015-09-06 DIAGNOSIS — G8918 Other acute postprocedural pain: Secondary | ICD-10-CM | POA: Insufficient documentation

## 2015-09-06 DIAGNOSIS — R293 Abnormal posture: Secondary | ICD-10-CM | POA: Diagnosis not present

## 2015-09-06 DIAGNOSIS — M6281 Muscle weakness (generalized): Secondary | ICD-10-CM | POA: Diagnosis not present

## 2015-09-06 DIAGNOSIS — R0600 Dyspnea, unspecified: Secondary | ICD-10-CM

## 2015-09-06 DIAGNOSIS — S52501D Unspecified fracture of the lower end of right radius, subsequent encounter for closed fracture with routine healing: Secondary | ICD-10-CM | POA: Diagnosis not present

## 2015-09-06 DIAGNOSIS — J438 Other emphysema: Secondary | ICD-10-CM

## 2015-09-06 DIAGNOSIS — S72141G Displaced intertrochanteric fracture of right femur, subsequent encounter for closed fracture with delayed healing: Secondary | ICD-10-CM | POA: Diagnosis not present

## 2015-09-06 DIAGNOSIS — D62 Acute posthemorrhagic anemia: Secondary | ICD-10-CM

## 2015-09-06 DIAGNOSIS — K7030 Alcoholic cirrhosis of liver without ascites: Secondary | ICD-10-CM

## 2015-09-06 DIAGNOSIS — Z720 Tobacco use: Secondary | ICD-10-CM | POA: Diagnosis not present

## 2015-09-06 DIAGNOSIS — D696 Thrombocytopenia, unspecified: Secondary | ICD-10-CM

## 2015-09-06 DIAGNOSIS — R55 Syncope and collapse: Secondary | ICD-10-CM | POA: Insufficient documentation

## 2015-09-06 DIAGNOSIS — S52611D Displaced fracture of right ulna styloid process, subsequent encounter for closed fracture with routine healing: Secondary | ICD-10-CM | POA: Diagnosis not present

## 2015-09-06 DIAGNOSIS — S72141A Displaced intertrochanteric fracture of right femur, initial encounter for closed fracture: Secondary | ICD-10-CM | POA: Diagnosis not present

## 2015-09-06 DIAGNOSIS — F1010 Alcohol abuse, uncomplicated: Secondary | ICD-10-CM

## 2015-09-06 DIAGNOSIS — S5290XA Unspecified fracture of unspecified forearm, initial encounter for closed fracture: Secondary | ICD-10-CM | POA: Diagnosis not present

## 2015-09-06 DIAGNOSIS — S52501G Unspecified fracture of the lower end of right radius, subsequent encounter for closed fracture with delayed healing: Secondary | ICD-10-CM | POA: Diagnosis not present

## 2015-09-06 DIAGNOSIS — Z419 Encounter for procedure for purposes other than remedying health state, unspecified: Secondary | ICD-10-CM | POA: Insufficient documentation

## 2015-09-06 DIAGNOSIS — S52611A Displaced fracture of right ulna styloid process, initial encounter for closed fracture: Secondary | ICD-10-CM

## 2015-09-06 DIAGNOSIS — F10920 Alcohol use, unspecified with intoxication, uncomplicated: Secondary | ICD-10-CM | POA: Diagnosis not present

## 2015-09-06 DIAGNOSIS — I10 Essential (primary) hypertension: Secondary | ICD-10-CM | POA: Insufficient documentation

## 2015-09-06 DIAGNOSIS — J449 Chronic obstructive pulmonary disease, unspecified: Secondary | ICD-10-CM | POA: Diagnosis not present

## 2015-09-06 DIAGNOSIS — S72141D Displaced intertrochanteric fracture of right femur, subsequent encounter for closed fracture with routine healing: Secondary | ICD-10-CM | POA: Diagnosis not present

## 2015-09-06 DIAGNOSIS — Z9181 History of falling: Secondary | ICD-10-CM | POA: Diagnosis not present

## 2015-09-06 DIAGNOSIS — D500 Iron deficiency anemia secondary to blood loss (chronic): Secondary | ICD-10-CM | POA: Diagnosis not present

## 2015-09-06 DIAGNOSIS — S72009A Fracture of unspecified part of neck of unspecified femur, initial encounter for closed fracture: Secondary | ICD-10-CM | POA: Diagnosis not present

## 2015-09-06 DIAGNOSIS — Z9660 Presence of unspecified orthopedic joint implant: Secondary | ICD-10-CM

## 2015-09-06 DIAGNOSIS — S52613A Displaced fracture of unspecified ulna styloid process, initial encounter for closed fracture: Secondary | ICD-10-CM | POA: Insufficient documentation

## 2015-09-06 DIAGNOSIS — S72001D Fracture of unspecified part of neck of right femur, subsequent encounter for closed fracture with routine healing: Secondary | ICD-10-CM | POA: Diagnosis not present

## 2015-09-06 DIAGNOSIS — Z7901 Long term (current) use of anticoagulants: Secondary | ICD-10-CM | POA: Diagnosis not present

## 2015-09-06 DIAGNOSIS — Z471 Aftercare following joint replacement surgery: Secondary | ICD-10-CM | POA: Diagnosis not present

## 2015-09-06 DIAGNOSIS — K7460 Unspecified cirrhosis of liver: Secondary | ICD-10-CM | POA: Diagnosis not present

## 2015-09-06 LAB — CBC
HCT: 28.7 % — ABNORMAL LOW (ref 39.0–52.0)
Hemoglobin: 9.8 g/dL — ABNORMAL LOW (ref 13.0–17.0)
MCH: 33.1 pg (ref 26.0–34.0)
MCHC: 34.1 g/dL (ref 30.0–36.0)
MCV: 97 fL (ref 78.0–100.0)
Platelets: 89 10*3/uL — ABNORMAL LOW (ref 150–400)
RBC: 2.96 MIL/uL — ABNORMAL LOW (ref 4.22–5.81)
RDW: 14.5 % (ref 11.5–15.5)
WBC: 4.8 10*3/uL (ref 4.0–10.5)

## 2015-09-06 LAB — ECHOCARDIOGRAM COMPLETE
Height: 71 in
Weight: 2448 oz

## 2015-09-06 MED ORDER — ENOXAPARIN SODIUM 40 MG/0.4ML ~~LOC~~ SOLN: 40.0000 mg | SUBCUTANEOUS | Status: DC

## 2015-09-06 NOTE — Clinical Social Work Note (Signed)
Clinical Social Work Assessment  Patient Details  Name: Ian Aguilar MRN: [id number] Date of Birth: May 03, 1940  Date of referral:  09/06/15               Reason for consult:  Facility Placement, Discharge Planning                Permission sought to share information with:  Facility Sport and exercise psychologist, Family Supports Permission granted to share information::  Yes, Verbal Permission Granted  Name::      (wife, Ian Aguilar [phone])  Agency::   [facility] )  Relationship::     Contact Information:     Housing/Transportation Living arrangements for the past 2 months:  [place] of Information:  Patient, Spouse Patient Interpreter Needed:  None Criminal Activity/Legal Involvement Pertinent to Current Situation/Hospitalization:  No - Comment as needed Significant Relationships:  Spouse Lives with:  Spouse Do you feel safe going back to the place where you live?    Need for family participation in patient care:     Care giving concerns:  none   Social Worker assessment / plan:  CSW spoke with patient's wife regarding possible discharge today to SNF.  Patient and wife are familiar with [facility] and wishes for CSW to send referral.  Patient will be transferred by [facility].  Employment status:  Retired Nurse, adult PT Recommendations:  [place] / Referral to community resources:  [place]  Patient/Family's Response to care:  Patient and wife are both agreeable to SNF at this time.  Patient/Family's Understanding of and Emotional Response to Diagnosis, Current Treatment, and Prognosis:  Patient and wife were disappointed that insurance would not approve CIR in a timely manner for patient to be discharged to CIR, but grateful for a bed at [facility].  Emotional Assessment Appearance:  Appears stated age Attitude/Demeanor/Rapport:    Affect (typically observed):  Accepting Orientation:   Oriented to Self, Oriented to Place, Oriented to  Time, Oriented to Situation Alcohol / Substance use:  Not Applicable Psych involvement (Current and /or in the community):  No (Comment)  Discharge Needs  Concerns to be addressed:  No discharge needs identified Readmission within the last 30 days:  No Current discharge risk:  None Barriers to Discharge:  No Barriers Identified   Ian Fanny, LCSW 09/06/2015, 2:54 PM

## 2015-09-06 NOTE — Consult Note (Signed)
Physical Medicine and Rehabilitation Consult Reason for Consult: Right intertrochanteric hip fracture, right wrist reduction Referring Physician: [facility]   HPI: Ian Aguilar is a 75 y.o. right handed male with history of COPD/tobacco abuse, alcohol abuse with cirrhosis and left THA 2013. Lives with spouse. Independent with assistive device prior to admission. Multilevel home. Presented 09/01/2015 after a fall when he became lightheaded. No loss of consciousness. Sustained a right intertrochanteric hip fracture as well as comminuted angulated impacted distal radius fracture extending into the distal radial ulnar and radial carpal joints. Nondisplaced ulna styloid fracture. Underwent closed reduction of right intratrochanteric hip fracture and right wrist reduction 09/02/2015 per Dr. Percell Miller. Weightbearing as tolerated right lower extremity nonweightbearing right upper extremity hospital course pain management. Workup for possible syncope leading to fall with echocardiogram pending. Subcutaneous Lovenox for DVT prophylaxis. Acute blood loss anemia 9.8 and monitored. Physical and occupational therapy evaluations completed. M.D. has requested physical medicine rehabilitation consult   Review of Systems  Constitutional: Negative for fever and chills.  HENT: Negative for hearing loss.   Eyes: Negative for blurred vision and double vision.  Respiratory: Positive for cough.        Shortness of breath with exertion  Cardiovascular: Negative for chest pain.  Gastrointestinal: Positive for constipation. Negative for nausea and vomiting.  Genitourinary: Positive for urgency. Negative for dysuria and hematuria.  Musculoskeletal: Positive for myalgias, joint pain and falls.  Skin: Negative for rash.  Neurological: Positive for dizziness. Negative for seizures and headaches.  Psychiatric/Behavioral: Positive for depression.       Anxiety  All other systems reviewed and are negative.  Past Medical  History  Diagnosis Date  . COPD (chronic obstructive pulmonary disease) ([place])   . ETOH abuse   . Cirrhosis ([place])     confirmed by MRI on 07/04/11.  no immunizations  . Hyperlipidemia   . Depression with anxiety   . Anxiety   . Arthritis    Past Surgical History  Procedure Laterality Date  . Knee surgery      left-arthroscopy-[facility]  . Skull fracture elevation      fractured cheeck bone repaired  . Broken arm  6 yrs ago    left otif of wrist-[facility]  . External ear surgery      right ear-cleaned out ear and created new eardrum  . Wisdom tooth extraction    . Bilateral cataract surgery      [place]  . Colonoscopy  1996    [facility]: external hemorrhoids, no polyps  . Colonoscopy  06/28/2011    Dr. Ala Bent diverticulosis,tubular adenoma, hyperplastic polyp  . Esophagogastroduodenoscopy  06/28/2011    Dr. Chelsea Aus erosive reflux esophagitis, hiatal hernia-gastritis  . Inguinal hernia repair  2-3 yrs ago    left-[facility]_  . 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 Intramedullary (im) nail intertrochanteric Right 09/02/2015    Procedure: RIGHT  INTERTROCHANTRIC HIP;  Surgeon: Renette Butters, MD;  Location: [facility];  Service: Orthopedics;  Laterality: Right;  With MAC  . Closed reduction wrist fracture Right 09/02/2015    Procedure: RIGHT WRIST REDUCTION;  Surgeon: Renette Butters, MD;  Location: [facility];  Service: Orthopedics;  Laterality: Right;  with MAC   Family History  Problem Relation Age of Onset  . Colon cancer Neg Hx    Social History:  reports that  he has been smoking Cigarettes.  He has a 30 pack-year smoking history. He has never used smokeless tobacco. He reports that he drinks alcohol. He reports that he does not use illicit drugs. Allergies: No Known Allergies Medications Prior to Admission  Medication Sig Dispense Refill  .  Cholecalciferol (VITAMIN D PO) Take 1 capsule by mouth every morning.     Marland Kitchen ibuprofen (ADVIL,MOTRIN) 200 MG tablet Take 200 mg by mouth every 6 (six) hours as needed for moderate pain.     Marland Kitchen tiotropium (SPIRIVA) 18 MCG inhalation capsule Place 18 mcg into inhaler and inhale every morning.     . [DISCONTINUED] ALPRAZolam (XANAX) 0.5 MG tablet Take 0.25 mg by mouth every 6 (six) hours as needed for anxiety or sleep. Reported on 08/05/2015    . oxyCODONE-acetaminophen (PERCOCET/ROXICET) 5-325 MG tablet Take 1 tablet by mouth every 4 (four) hours as needed for severe pain. (Patient not taking: Reported on 08/05/2015) 84 tablet 0    Home: Home Living Family/patient expects to be discharged to:: Inpatient rehab Living Arrangements: Spouse/significant other Available Help at Discharge: Available 24 hours/day, Family Type of Home: House Home Access: Stairs to enter Technical brewer of Steps: 2 Entrance Stairs-Rails: Left Home Layout: Multi-level Alternate Level Stairs-Number of Steps: 2-7 steps depending on the level Alternate Level Stairs-Rails:  (has rails but side varies with different stairs) Bathroom Shower/Tub: Chiropodist: Handicapped height Home Equipment: Environmental consultant - 2 wheels, Wheelchair - manual, [employer] - single point Additional Comments: Pt states that he feels he may need to go somewhere for rehab after acute stay  Functional History: Prior Function Level of Independence: Independent with assistive device(s) Comments: used SPC with community ambulation Functional Status:  Mobility: Bed Mobility Overal bed mobility: Needs Assistance Bed Mobility: Supine to Sit Supine to sit: Mod assist, +2 for safety/equipment Sit to supine: Mod assist (assist with trunk and Rt LE) General bed mobility comments: Assist to advance RLE, powerup trunkand to advance hips to EOB position. Cued to use LUE on bed rail and to not use RUE as support/assist during bed mobility.   Transfers Overall transfer level: Needs assistance Equipment used: Right platform walker Transfers: Stand Pivot Transfers, Sit to/from Stand Sit to Stand: +2 physical assistance, Mod assist Stand pivot transfers: Min assist, +2 physical assistance General transfer comment: cues for hand placement and assist for powerup, EOB>recliner Ambulation/Gait General Gait Details: pt refusing to attempt ambulation due to pain.     ADL: ADL Overall ADL's : Needs assistance/impaired Grooming: Wash/dry hands, Wash/dry face, Sitting, Minimal assistance, Min guard Upper Body Bathing: Minimal assitance, Sitting Lower Body Bathing: Maximal assistance Upper Body Dressing : Minimal assistance, Sitting Lower Body Dressing: Total assistance Toilet Transfer: BSC, +2 for physical assistance, Stand-pivot, Maximal assistance (simulated) Toileting- Clothing Manipulation and Hygiene: Total assistance Toileting - Clothing Manipulation Details (indicate cue type and reason): pt able to use urinal seated EOB Functional mobility during ADLs: Moderate assistance, Maximal assistance, +2 for physical assistance (platform walker) General ADL Comments: Pt completed bed mobility and SPT to recliner, Cues needed to adhere to NWB RUE throughout session.  Cognition: Cognition Overall Cognitive Status: Within Functional Limits for tasks assessed Orientation Level: Oriented X4 Cognition Arousal/Alertness: Awake/alert Behavior During Therapy: WFL for tasks assessed/performed Overall Cognitive Status: Within Functional Limits for tasks assessed  Blood pressure 130/49, pulse 72, temperature 98.5 F (36.9 C), temperature source Oral, resp. rate 16, height '5\' 11"'$  (1.803 m), weight 69.4 kg (153 lb), SpO2 99 %. Physical Exam  Vitals reviewed. Constitutional: He is oriented to person, place, and time. He appears well-developed and well-nourished.  HENT:  Head: Normocephalic.  Right Ear: External ear normal.  Left Ear:  External ear normal.  Eyes: Conjunctivae and EOM are normal.  Neck: Normal range of motion. Neck supple. No thyromegaly present.  Cardiovascular: Normal rate and regular rhythm.   Respiratory: Effort normal and breath sounds normal. No respiratory distress.  +  GI: Soft. Bowel sounds are normal. He exhibits no distension.  Musculoskeletal: He exhibits edema and tenderness.  Neurological: He is alert and oriented to person, place, and time.  Motor: RUE: Wiggles fingers RLE: hip flexion, knee extension 3/5, ankle dorsi/plantar flexion 5/5 LUE/LLE: 5/5 proximal to distal Sensation intact to light touch  Skin: Skin is warm and dry.  Right upper extremity with soft splint in place.  Right hip dressing c/d/i  Psychiatric: He has a normal mood and affect. His behavior is normal.    Results for orders placed or performed during the hospital encounter of 09/01/15 (from the past 24 hour(s))  Basic metabolic panel     Status: Abnormal   Collection Time: 09/05/15  6:02 AM  Result Value Ref Range   Sodium 133 (L) 135 - 145 mmol/L   Potassium 3.5 3.5 - 5.1 mmol/L   Chloride 100 (L) 101 - 111 mmol/L   CO2 26 22 - 32 mmol/L   Glucose, Bld 108 (H) 65 - 99 mg/dL   BUN 10 6 - 20 mg/dL   Creatinine, Ser 0.71 0.61 - 1.24 mg/dL   Calcium 8.2 (L) 8.9 - 10.3 mg/dL   GFR calc non Af Amer >60 >60 mL/min   GFR calc Af Amer >60 >60 mL/min   Anion gap 7 5 - 15  CBC     Status: Abnormal   Collection Time: 09/05/15  6:02 AM  Result Value Ref Range   WBC 3.6 (L) 4.0 - 10.5 K/uL   RBC 2.53 (L) 4.22 - 5.81 MIL/uL   Hemoglobin 8.5 (L) 13.0 - 17.0 g/dL   HCT 24.8 (L) 39.0 - 52.0 %   MCV 98.0 78.0 - 100.0 fL   MCH 33.6 26.0 - 34.0 pg   MCHC 34.3 30.0 - 36.0 g/dL   RDW 14.3 11.5 - 15.5 %   Platelets 121 (L) 150 - 400 K/uL  CBC     Status: Abnormal   Collection Time: 09/06/15  3:44 AM  Result Value Ref Range   WBC 4.8 4.0 - 10.5 K/uL   RBC 2.96 (L) 4.22 - 5.81 MIL/uL   Hemoglobin 9.8 (L) 13.0 - 17.0  g/dL   HCT 28.7 (L) 39.0 - 52.0 %   MCV 97.0 78.0 - 100.0 fL   MCH 33.1 26.0 - 34.0 pg   MCHC 34.1 30.0 - 36.0 g/dL   RDW 14.5 11.5 - 15.5 %   Platelets 89 (L) 150 - 400 K/uL   No results found.  Assessment/Plan: Diagnosis: Right intertrochanteric hip fracture, right wrist reduction Labs and images independently reviewed.  Records reviewed and summated above.  1. Does the need for close, 24 hr/day medical supervision in concert with the patient's rehab needs make it unreasonable for this patient to be served in a less intensive setting? Yes  2. Co-Morbidities requiring supervision/potential complications: COPD/tobacco abuse (monitor RR with increased physical activity, counsel), alcohol abuse with cirrhosis (counsel, avoid hepatotoxic meds), left THA 2013, post-op pain management (Biofeedback training with therapies to help reduce reliance on opiate pain medications, monitor pain  control during therapies, and sedation at rest and titrate to maximum efficacy to ensure participation and gains in therapies), Acute blood loss anemia (transfuse if necessary to ensure appropriate perfusion for increased activity tolerance), HTN (monitor and provide prns in accordance with increased physical exertion and pain), Thrombocytopenia (< 60,000/mm3 no resistive exercise), hyponatremia (cont to monitor, treat if necessary), syncope (workup on going) 3. Due to safety, skin/wound care, disease management, pain management and patient education, does the patient require 24 hr/day rehab nursing? Yes 4. Does the patient require coordinated care of a physician, rehab nurse, PT (1-2 hrs/day, 5 days/week) and OT (1-2 hrs/day, 5 days/week) to address physical and functional deficits in the context of the above medical diagnosis(es)? Yes Addressing deficits in the following areas: balance, endurance, locomotion, strength, transferring, bathing, dressing, toileting and psychosocial support 5. Can the patient actively  participate in an intensive therapy program of at least 3 hrs of therapy per day at least 5 days per week? Yes 6. The potential for patient to make measurable gains while on inpatient rehab is excellent 7. Anticipated functional outcomes upon discharge from inpatient rehab are supervision and min assist  with PT, supervision and min assist with OT, n/a with SLP. 8. Estimated rehab length of stay to reach the above functional goals is: 15-19 days. 9. Does the patient have adequate social supports and living environment to accommodate these discharge functional goals? Potentially 10. Anticipated D/C setting: Home 11. Anticipated post D/C treatments: HH therapy and Home excercise program 12. Overall Rehab/Functional Prognosis: excellent  RECOMMENDATIONS: This patient's condition is appropriate for continued rehabilitative care in the following setting: CIR after completion of medical workup Patient has agreed to participate in recommended program. Yes Note that insurance prior authorization may be required for reimbursement for recommended care.  Comment: Rehab Admissions Coordinator to follow up.  Delice Lesch, MD 09/06/2015

## 2015-09-06 NOTE — Progress Notes (Signed)
   Assessment: 4 Days Post-Op  S/P Procedure(s) (LRB): RIGHT  INTERTROCHANTRIC HIP (Right) RIGHT WRIST REDUCTION (Right) by Dr. Ernesta Amble. Percell Miller on 09/02/15  Principal Problem:   Fracture of right hip requiring operative repair [facility]) Active Problems:   Cirrhosis- imaging diagnosis 2013   S/P hip replacement   Alcohol abuse   Tobacco abuse   COPD (chronic obstructive pulmonary disease)   Distal radius fracture, right   Thrombocytopenia ([facility])   Hyponatremia   Closed displaced intertrochanteric fracture of right femur ([facility])  ADDITIONAL DIAGNOSIS:  Expected Acute Blood Loss Anemia, likely w/ dilutional component.  H/H up today.  Plan: Up with therapy  Weight Bearing: Weight Bearing as Tolerated (WBAT) right leg.  NWB Right arm. Dressings: PRN VTE prophylaxis: Lovenox .  Lovenox for 30 days after d/c. Dispo: per primary.  To SNF or CIR Today.  Subjective: Patient reports pain as mild / controlled.  Taking PO meds.  +bm/flatus.  Has been up on feet, but not ambulating.  Tolerating diet.  Objective:   VITALS:   Filed Vitals:   09/05/15 0527 09/05/15 1303 09/05/15 2105 09/06/15 0636  BP: 157/65 100/57 130/49 168/73  Pulse: 91 73 72 100  Temp: 98.7 F (37.1 C) 97.8 F (36.6 C) 98.5 F (36.9 C) 99.9 F (37.7 C)  TempSrc: Oral Oral Oral Oral  Resp: '16 18 16 16  '$ Height:      Weight:      SpO2: 95% 97% 99% 94%    Lab Results  Component Value Date   WBC 4.8 09/06/2015   HGB 9.8* 09/06/2015   HCT 28.7* 09/06/2015   MCV 97.0 09/06/2015   PLT 89* 09/06/2015   BMET    Component Value Date/Time   NA 133* 09/05/2015 0602   K 3.5 09/05/2015 0602   CL 100* 09/05/2015 0602   CO2 26 09/05/2015 0602   GLUCOSE 108* 09/05/2015 0602   BUN 10 09/05/2015 0602   CREATININE 0.71 09/05/2015 0602   CREATININE 0.89 06/26/2011 1506   CALCIUM 8.2* 09/05/2015 0602   GFRNONAA >60 09/05/2015 0602   GFRAA >60 09/05/2015 0602     Physical Exam General: NAD.   Neurologically  intact ABD soft Neurovascular intact Sensation intact distally Dorsiflexion/Plantar flexion intact Incision: dressing C/D/I    Prudencio Burly III 09/06/2015, 6:56 AM

## 2015-09-06 NOTE — Progress Notes (Signed)
Physical Therapy Treatment Patient Details Name: Ian Aguilar MRN: [id number] DOB: [birth year] Today's Date: 09/06/2015    History of Present Illness 75 y.o. male with a fall and subsequent Rt intertrochanteric hip fracture and Rt wrist fx. Pt is now s/p RIGHT INTERTROCHANTRIC HIP nail, RIGHT WRIST REDUCTION. The pt had a previous fall with Lt periprosthetic fracture in April managed nonoperatively PMH: left THA 2013 , COPD, tobacco abuse, and cirrhosis secondary to alcohol abuse, anxiety, depression.     PT Comments    Pt able to ambulate to day with assistance, 7 ft with rw and mod assist. Pt with improved weightbearing tolerance through the Rt LE. Cues provided throughout session to not use Rt UE with mobility. Recommending CIR for further rehabilitation following acute stay. PT to continue to follow.   Follow Up Recommendations  Supervision for mobility/OOB;CIR     Equipment Recommendations  Other (comment) (to be assessed at next venue)    Recommendations for Other Services       Precautions / Restrictions Precautions Precautions: Fall Restrictions Weight Bearing Restrictions: Yes RUE Weight Bearing: Non weight bearing RLE Weight Bearing: Weight bearing as tolerated    Mobility  Bed Mobility Overal bed mobility: Needs Assistance Bed Mobility: Supine to Sit     Supine to sit: +2 for physical assistance;Mod assist;Min assist     General bed mobility comments: assist needed at trunk and at pelvis to turn to sitting EOB. Frequent reminders to not use Rt UE.   Transfers Overall transfer level: Needs assistance Equipment used: Right platform walker Transfers: Sit to/from Stand;Stand Pivot Transfers Sit to Stand: +2 physical assistance;Mod assist Stand pivot transfers: +2 physical assistance;Mod assist;Min assist       General transfer comment: transfers performed from bed>chair>BSC>chair. Cues provided as needed to avoid use of Rt UE.    Ambulation/Gait Ambulation/Gait assistance: Mod assist Ambulation Distance (Feet): 7 Feet Assistive device: Rolling walker (2 wheeled);Right platform walker Gait Pattern/deviations: Step-through pattern;Decreased stance time - right;Decreased weight shift to right Gait velocity: slow pattern   General Gait Details: assist provided for balance and weightshift as well as rw placement.   Stairs            Wheelchair Mobility    Modified Rankin (Stroke Patients Only)       Balance Overall balance assessment: Needs assistance Sitting-balance support: No upper extremity supported Sitting balance-Leahy Scale: Good     Standing balance support: Bilateral upper extremity supported Standing balance-Leahy Scale: Poor Standing balance comment: using rw for support                    Cognition Arousal/Alertness: Awake/alert Behavior During Therapy: WFL for tasks assessed/performed Overall Cognitive Status: Within Functional Limits for tasks assessed                      Exercises      General Comments        Pertinent Vitals/Pain Pain Assessment: Faces Faces Pain Scale: Hurts a little bit Pain Location: Rt hip Pain Descriptors / Indicators: Grimacing Pain Intervention(s): Limited activity within patient's tolerance;Monitored during session    Home Living                      Prior Function            PT Goals (current goals can now be found in the care plan section) Acute Rehab PT Goals Patient Stated Goal: do a little more  PT Goal Formulation: With patient Time For Goal Achievement: 09/16/15 Potential to Achieve Goals: Good Progress towards PT goals: Progressing toward goals    Frequency  Min 5X/week    PT Plan Current plan remains appropriate    Co-evaluation             End of Session Equipment Utilized During Treatment: Gait belt Activity Tolerance: Patient tolerated treatment well Patient left: in chair;with call  bell/phone within reach;with chair alarm set     Time: 1610-9604 PT Time Calculation (min) (ACUTE ONLY): 24 min  Charges:  $Gait Training: 8-22 mins $Therapeutic Activity: 8-22 mins                    G Codes:      Cassell Clement, PT, CSCS Pager [phone] Office [phone]  09/06/2015, 10:33 AM

## 2015-09-06 NOTE — Progress Notes (Signed)
Report was called to [place] at [facility].

## 2015-09-06 NOTE — NC FL2 (Signed)
[place] LEVEL OF CARE SCREENING TOOL     IDENTIFICATION  Patient Name: Ian Aguilar Birthdate: 12/14/1940 Sex: male Admission Date (Current Location): 09/01/2015  [facility] and [place] Number:  [employer] and Address:  [employer]. [facility], [place] [address] [place], [place] [address]      Provider Number: [id number]  Attending Physician Name and Address:  Orson Eva, MD  Relative Name and Phone Number:       Current Level of Care: Hospital Recommended Level of Care: [place] Prior Approval Number:    Date Approved/Denied:   PASRR Number:    Discharge Plan: SNF    Current Diagnoses: Patient Active Problem List   Diagnosis Date Noted  . Fracture of ulnar styloid   . Surgery, elective   . Post-operative pain   . Benign essential HTN   . Faintness   . Thrombocytopenia ([place]) 09/03/2015  . Hyponatremia 09/03/2015  . Closed displaced intertrochanteric fracture of right femur ([place])   . Distal radius fracture, right 09/02/2015  . Fracture of right hip requiring operative repair ([place]) 09/01/2015  . Alcohol abuse 09/01/2015  . Tobacco abuse 09/01/2015  . COPD (chronic obstructive pulmonary disease) 09/01/2015  . Hip arthritis 12/23/2013  . 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 Weakness of left leg 01/10/2012  . Difficulty in walking(719.7) 01/10/2012  . Pain in left hip 01/10/2012  . Acute blood loss anemia 12/21/2011  . Cirrhosis- imaging diagnosis 2013 09/08/2011  . Umbilical hernia 41/32/4401  . 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  . Hepatomegaly 06/14/2011  . Encounter for screening colonoscopy 06/14/2011  . JOINT EFFUSION, KNEE 02/10/2008  . KNEE PAIN 02/10/2008    Orientation RESPIRATION BLADDER Height & Weight     Self, Time, Situation, Place  O2 (2L) Continent Weight: 153 lb (69.4 kg) Height:  '5\' 11"'$  (180.3 cm)  BEHAVIORAL SYMPTOMS/MOOD NEUROLOGICAL BOWEL NUTRITION STATUS      Continent Diet  (regular)  AMBULATORY STATUS COMMUNICATION OF NEEDS Skin   Extensive Assist (2+ assist) Verbally Surgical wounds                       Personal Care Assistance Level of Assistance  Dressing, Bathing Bathing Assistance: Limited assistance   Dressing Assistance: Limited assistance     Functional Limitations Info             SPECIAL CARE FACTORS FREQUENCY  PT (By licensed PT), OT (By licensed OT)     PT Frequency: daily OT Frequency: daily            Contractures Contractures Info: Not present    Additional Factors Info                  Current Medications (09/06/2015):  This is the current hospital active medication list Current Facility-Administered Medications  Medication Dose Route Frequency Provider Last Rate Last Dose  . acetaminophen (TYLENOL) tablet 650 mg  650 mg Oral Q6H PRN Albertine Patricia, MD   650 mg at 09/03/15 0947   Or  . acetaminophen (TYLENOL) suppository 650 mg  650 mg Rectal Q6H PRN Albertine Patricia, MD      . bisacodyl (DULCOLAX) suppository 10 mg  10 mg Rectal Once Orson Eva, MD   10 mg at 09/05/15 1615  . diphenhydrAMINE (BENADRYL) 12.5 MG/5ML elixir 12.5-25 mg  12.5-25 mg Oral Q4H PRN Albertine Patricia, MD      . docusate sodium (COLACE)  capsule 100 mg  100 mg Oral BID Roney Jaffe, MD   100 mg at 09/06/15 1017  . enoxaparin (LOVENOX) injection 40 mg  40 mg Subcutaneous Q24H Albertine Patricia, MD   40 mg at 09/06/15 1017  . folic acid (FOLVITE) tablet 1 mg  1 mg Oral Daily Roney Jaffe, MD   1 mg at 09/06/15 1016  . HYDROcodone-acetaminophen (NORCO/VICODIN) 5-325 MG per tablet 1-2 tablet  1-2 tablet Oral Q6H PRN Roney Jaffe, MD   2 tablet at 09/06/15 1631  . methocarbamol (ROBAXIN) tablet 500 mg  500 mg Oral Q6H PRN Albertine Patricia, MD   500 mg at 09/06/15 1633   Or  . methocarbamol (ROBAXIN) 500 mg in dextrose 5 % 50 mL IVPB  500 mg Intravenous Q6H PRN Albertine Patricia, MD      . metoCLOPramide (REGLAN) tablet 5-10 mg   5-10 mg Oral Q8H PRN Albertine Patricia, MD       Or  . metoCLOPramide (REGLAN) injection 5-10 mg  5-10 mg Intravenous Q8H PRN Silver Huguenin Elgergawy, MD      . morphine 2 MG/ML injection 0.5-1 mg  0.5-1 mg Intravenous Q2H PRN Roney Jaffe, MD      . multivitamin with minerals tablet 1 tablet  1 tablet Oral Daily Roney Jaffe, MD   1 tablet at 09/06/15 1016  . ondansetron (ZOFRAN) tablet 4 mg  4 mg Oral Q6H PRN Albertine Patricia, MD       Or  . ondansetron (ZOFRAN) injection 4 mg  4 mg Intravenous Q6H PRN Albertine Patricia, MD      . oxyCODONE-acetaminophen (PERCOCET/ROXICET) 5-325 MG per tablet 1 tablet  1 tablet Oral Q4H PRN Roney Jaffe, MD      . polyethylene glycol (MIRALAX / GLYCOLAX) packet 17 g  17 g Oral Daily PRN Roney Jaffe, MD      . senna (SENOKOT) tablet 17.2 mg  2 tablet Oral Daily Shanon Brow Tat, MD   17.2 mg at 09/06/15 1016  . thiamine (VITAMIN B-1) tablet 100 mg  100 mg Oral Daily Roney Jaffe, MD   100 mg at 09/06/15 1017  . tiotropium (SPIRIVA) inhalation capsule 18 mcg  18 mcg Inhalation Daily Roney Jaffe, MD   18 mcg at 09/06/15 0932  . vitamin B-12 (CYANOCOBALAMIN) tablet 500 mcg  500 mcg Oral Daily Orson Eva, MD   500 mcg at 09/06/15 1017     Discharge Medications: Please see discharge summary for a list of discharge medications.  Relevant Imaging Results:  Relevant Lab Results:   Additional Information SSN [id number]  Roanna Raider, LCSW

## 2015-09-06 NOTE — Progress Notes (Signed)
I discussed case with Daphene Calamity, Director of SW and SW, [place]. I contacted Randi at [facility] who states that inpt rehab admission decision will not be made today by 5 pm. Pt is medically ready for d/c today therefore I have withdrawn my request for an inpt rehab admission. SW, Barnett Applebaum has been made aware and I contacted pt's wife and she is aware that SNF rehab is being pursued at this time. [phone]

## 2015-09-06 NOTE — Care Management Important Message (Signed)
Important Message  Patient Details  Name: Ian Aguilar MRN: [id number] Date of Birth: 1941/01/28   Medicare Important Message Given:  Yes    Loann Quill 09/06/2015, 9:43 AM

## 2015-09-06 NOTE — Clinical Social Work Note (Signed)
SNF has been initiated for a simultaneous plan for patient's discharge.  Patient and wife prefer CIR.  SNF offers available if this no longer becomes an option.  Nonnie Done, LCSW [phone]  [address] [place] Licensed Clinical Social Worker

## 2015-09-06 NOTE — Clinical Social Work Note (Addendum)
CIR states patient and wife request CIR and authorization will not be pulled until today at [facility] contacted [employer] to inquire if authorization could be received after 5pm. [employer] states there are no guarantees and requests that CIR pull authorization prior to 5pm in order to facilitate SNF authorization.  CIR notified.     PASARR received: [id number] Beverly Milch, LCSW [phone]  [address] [facility] [place] Licensed Clinical Social Worker

## 2015-09-06 NOTE — Clinical Social Work Placement (Signed)
   CLINICAL SOCIAL WORK PLACEMENT  NOTE  Date:  09/06/2015  Patient Details  Name: Ian Aguilar MRN: [id number] Date of Birth: Oct 28, 1940  Clinical Social Work is seeking post-discharge placement for this patient at the [place] level of care (*CSW will initial, date and re-position this form in  chart as items are completed):  Yes   Patient/family provided with [facility] Work Department's list of facilities offering this level of care within the geographic area requested by the patient (or if unable, by the patient's family).  Yes   Patient/family informed of their freedom to choose among providers that offer the needed level of care, that participate in Medicare, Medicaid or managed care program needed by the patient, have an available bed and are willing to accept the patient.  Yes   Patient/family informed of [facility]'s ownership interest in [facility] and [facility], as well as of the fact that they are under no obligation to receive care at these facilities.  PASRR submitted to EDS on 09/06/15     PASRR number received on 09/06/15     Existing PASRR number confirmed on       FL2 transmitted to all facilities in geographic area requested by pt/family on 09/06/15     FL2 transmitted to all facilities within larger geographic area on       Patient informed that his/her managed care company has contracts with or will negotiate with certain facilities, including the following:        Yes   Patient/family informed of bed offers received.  Patient chooses bed at [facility]     Physician recommends and patient chooses bed at      Patient to be transferred to [facility] on 09/06/15.  Patient to be transferred to facility by p[facility]AR     Patient family notified on 09/06/15 of transfer.  Name of family member notified:  wife, [name] at bedside     PHYSICIAN Please sign FL2     Additional Comment:     _______________________________________________ [name], LCSW 09/06/2015, 2:56 PM

## 2015-09-06 NOTE — Progress Notes (Signed)
*  PRELIMINARY RESULTS* Echocardiogram 2D Echocardiogram has been performed.  Ian Aguilar 09/06/2015, 4:22 PM

## 2015-09-06 NOTE — Progress Notes (Addendum)
I met with pt at bedside to discuss a possible inpt rehab admission pending his insurance approval and his wife's agreement. I have placed call to his wife to call me and I will initiaet insurance approval process. [phone] I spoke with his wife by phone and she prefers inpt rehab at [facility]. I will begin approval process.

## 2015-09-07 ENCOUNTER — Encounter: Payer: Self-pay | Admitting: Internal Medicine

## 2015-09-07 ENCOUNTER — Non-Acute Institutional Stay (SKILLED_NURSING_FACILITY): Payer: PPO | Admitting: Internal Medicine

## 2015-09-07 DIAGNOSIS — S72001D Fracture of unspecified part of neck of right femur, subsequent encounter for closed fracture with routine healing: Secondary | ICD-10-CM | POA: Diagnosis not present

## 2015-09-07 DIAGNOSIS — D62 Acute posthemorrhagic anemia: Secondary | ICD-10-CM | POA: Diagnosis not present

## 2015-09-07 DIAGNOSIS — S52501D Unspecified fracture of the lower end of right radius, subsequent encounter for closed fracture with routine healing: Secondary | ICD-10-CM

## 2015-09-07 DIAGNOSIS — F1010 Alcohol abuse, uncomplicated: Secondary | ICD-10-CM | POA: Diagnosis not present

## 2015-09-07 DIAGNOSIS — D696 Thrombocytopenia, unspecified: Secondary | ICD-10-CM | POA: Diagnosis not present

## 2015-09-07 DIAGNOSIS — Z8489 Family history of other specified conditions: Secondary | ICD-10-CM

## 2015-09-07 DIAGNOSIS — Z87898 Personal history of other specified conditions: Secondary | ICD-10-CM | POA: Insufficient documentation

## 2015-09-07 NOTE — Assessment & Plan Note (Signed)
Increased risk of falls discussed especially if alcohol and Xanax taken concurrently

## 2015-09-07 NOTE — Progress Notes (Signed)
[place] Room Number: 160   This is a comprehensive admission note to [facility] personally performed by Unice Cobble MD on this date less than 30 days from date of admission. Included are preadmission medical/surgical history;reconciled medication list; family history; social history and comprehensive review of systems.  Corrections and additions to the records were documented . Comprehensive physical exam was also performed. Additionally a clinical summary was entered for each active diagnosis pertinent to this admission in the Problem List to enhance continuity of care.  ZWC:HENIDPO , Betsy Coder, MD [id number] [place] [address]   HPI: The patient was hospitalized 7/5-7/10/17 for IM nailing of R hip and closed right wrist reduction 09/02/15 for fractures sustained in a fall. He experienced dizziness upon standing with possible near syncope. He does drink at least 4 beers per day; but he stated that that day he drank "a thimble full". There was no cardiac or neurologic prodrome prior to the fall. He states he fractured his left hip 3 months ago when he became dizzy getting out of bed. There was no neurologic or cardiac prodrome prior to that event either. He has Xanax every 6 hours as needed listed on his med list. He states he does take it as needed. He also continues to smoke one half pack per day as he has done for > 6 decades   Past medical and surgical history: Issues include hepatic cirrhosis, alcohol & tobacco dependence as noted, COPD, and dyslipidemia.  Social history: As noted above  Family history: Updated  Comprehensive review of systems is positive for pain in the right forearm and hip mainly with mobilization or range of motion.  He states he has recently been constipated. He states his wife describes significant snoring &  intermittent apnea. This has never been evaluated. He describes anxiety intermittently as  noted. He also has easy bruising. He has no other bleeding dyscrasias. Constitutional: No fever,significant weight change, fatigue  Eyes: No redness, discharge, pain, vision change ENT/mouth: No nasal congestion,  purulent discharge, earache,change in hearing ,sore throat  Cardiovascular: No chest pain, palpitations,paroxysmal nocturnal dyspnea, claudication, edema  Respiratory: No active cough, sputum production,hemoptysis,  Gastrointestinal: No heartburn,dysphagia,abdominal pain, nausea / vomiting,rectal bleeding, melena Genitourinary: No dysuria,hematuria, pyuria,  incontinence, nocturia Dermatologic: No rash, pruritus, change in appearance of skin Neurologic: No headache, seizures, numbness , tingling Psychiatric: No significant anxiety , depression, insomnia, anorexia Endocrine: No change in hair/skin/ nails, excessive thirst, excessive hunger, excessive urination  Hematologic/lymphatic: No significant lymphadenopathy,abnormal bleeding Allergy/immunology: No itchy/ watery eyes, significant sneezing, urticaria, angioedema  Physical exam:  Pertinent or positive findings: Pattern alopecia is present. He has a beard and mustache. He has an upper plate and lower partial. Breath sounds are decreased and distant as are heart sounds. He has fusiform changes of the knees He has bruising over the extremities especially the dorsum of the left hand. The right upper extremity is heavily wrapped in bandages. He has skin avulsion on the left forearm which is Steri-Stripped. He has well-healed scars over the shins bilaterally.  General appearance:Adequately nourished; no acute distress , increased work of breathing is present.   Lymphatic: No lymphadenopathy about the head, neck, axilla . Eyes: No conjunctival inflammation or lid edema is present. There is no scleral icterus. Ears:  External ear exam shows no significant lesions or deformities.   Nose:  External nasal examination shows no deformity  or inflammation. Nasal mucosa are pink and moist without lesions ,  exudates Oral exam: lips and gums are healthy appearing.There is no oropharyngeal erythema or exudate . Neck:  No thyromegaly, masses, tenderness noted.    Heart:  Normal rate and regular rhythm. S1 and S2 normal without gallop, murmur, click, rub .  Lungs:Chest clear to auscultation without wheezes, rhonchi,rales , rubs. Abdomen:Bowel sounds are normal. Abdomen is soft and nontender with no organomegaly, hernias,masses. GU: deferred . Extremities:  No cyanosis, clubbing,edema  Neurologic exam : Balance,Rhomberg,finger to nose testing could not be completed due to clinical state Skin: Warm & dry w/o tenting. No significant lesions or rash.  See clinical summary under each active problem in the Problem List with associated updated therapeutic plan

## 2015-09-07 NOTE — Assessment & Plan Note (Signed)
Sleep apnea evaluation as per Dr. Hilma Favors recommended following recovery from the hip and radial fractures

## 2015-09-07 NOTE — Assessment & Plan Note (Signed)
Because of low platelet count and Lovenox therapy, avoid all aspirin , ibuprofen, naproxen etc .Repeat platelet count if  any abnormal bruising or bleeding occur.

## 2015-09-07 NOTE — Assessment & Plan Note (Signed)
7/10  Hgb 9.8/hematocrit 28.7 Monitor for any active bleeding of Lovenox therapy and documented thrombocytopenia

## 2015-09-07 NOTE — Assessment & Plan Note (Signed)
As per Dr Aline Brochure

## 2015-09-07 NOTE — Assessment & Plan Note (Signed)
Isometric exercises 4- 5 times prior to standing if supine or seated for a period of time  discussed. Risk of rales with Xanax especially in context of alcohol intake discussed

## 2015-09-07 NOTE — Patient Instructions (Addendum)
Because of his history of falls with recurrent hip fractures , Xanax should be avoided as much as possible , especially with alcohol. Isometric exercises prior to standing are indicated. Sleep apnea workup is indicated after he recovers from his hip and wrist fractures and this was discussed with him

## 2015-09-08 ENCOUNTER — Other Ambulatory Visit (HOSPITAL_COMMUNITY)
Admission: RE | Admit: 2015-09-08 | Discharge: 2015-09-08 | Disposition: A | Discharge status: Home / Self Care | Payer: PPO | Source: Skilled Nursing Facility | Attending: Internal Medicine | Admitting: Internal Medicine

## 2015-09-08 ENCOUNTER — Other Ambulatory Visit: Payer: Self-pay

## 2015-09-08 DIAGNOSIS — D500 Iron deficiency anemia secondary to blood loss (chronic): Secondary | ICD-10-CM | POA: Diagnosis not present

## 2015-09-08 LAB — C DIFFICILE QUICK SCREEN W PCR REFLEX
C Diff antigen: NEGATIVE
C Diff interpretation: NOT DETECTED
C Diff toxin: NEGATIVE

## 2015-09-08 MED ORDER — ALPRAZOLAM 0.25 MG PO TABS: ORAL_TABLET | ORAL | Status: DC

## 2015-09-08 NOTE — Telephone Encounter (Signed)
Refill request from [facility]

## 2015-09-09 ENCOUNTER — Encounter (HOSPITAL_COMMUNITY)
Admission: RE | Admit: 2015-09-09 | Discharge: 2015-09-09 | Disposition: A | Discharge status: Home / Self Care | Payer: PPO | Source: Skilled Nursing Facility | Attending: Internal Medicine | Admitting: Internal Medicine

## 2015-09-09 DIAGNOSIS — Z471 Aftercare following joint replacement surgery: Secondary | ICD-10-CM | POA: Insufficient documentation

## 2015-09-09 DIAGNOSIS — R279 Unspecified lack of coordination: Secondary | ICD-10-CM | POA: Insufficient documentation

## 2015-09-09 DIAGNOSIS — Z7901 Long term (current) use of anticoagulants: Secondary | ICD-10-CM | POA: Insufficient documentation

## 2015-09-09 DIAGNOSIS — D500 Iron deficiency anemia secondary to blood loss (chronic): Secondary | ICD-10-CM | POA: Insufficient documentation

## 2015-09-09 DIAGNOSIS — Z720 Tobacco use: Secondary | ICD-10-CM | POA: Insufficient documentation

## 2015-09-09 LAB — CBC
HCT: 24.2 % — ABNORMAL LOW (ref 39.0–52.0)
Hemoglobin: 8.4 g/dL — ABNORMAL LOW (ref 13.0–17.0)
MCH: 34 pg (ref 26.0–34.0)
MCHC: 34.7 g/dL (ref 30.0–36.0)
MCV: 98 fL (ref 78.0–100.0)
Platelets: 200 10*3/uL (ref 150–400)
RBC: 2.47 MIL/uL — ABNORMAL LOW (ref 4.22–5.81)
RDW: 15 % (ref 11.5–15.5)
WBC: 3.3 10*3/uL — ABNORMAL LOW (ref 4.0–10.5)

## 2015-09-15 ENCOUNTER — Non-Acute Institutional Stay (SKILLED_NURSING_FACILITY): Payer: PPO | Admitting: Internal Medicine

## 2015-09-15 ENCOUNTER — Encounter: Payer: Self-pay | Admitting: Internal Medicine

## 2015-09-15 DIAGNOSIS — Z9660 Presence of unspecified orthopedic joint implant: Secondary | ICD-10-CM

## 2015-09-15 DIAGNOSIS — D509 Iron deficiency anemia, unspecified: Secondary | ICD-10-CM

## 2015-09-15 DIAGNOSIS — M6249 Contracture of muscle, multiple sites: Secondary | ICD-10-CM | POA: Diagnosis not present

## 2015-09-15 DIAGNOSIS — M62838 Other muscle spasm: Secondary | ICD-10-CM

## 2015-09-15 DIAGNOSIS — Z7901 Long term (current) use of anticoagulants: Secondary | ICD-10-CM | POA: Diagnosis not present

## 2015-09-15 DIAGNOSIS — Z96649 Presence of unspecified artificial hip joint: Secondary | ICD-10-CM

## 2015-09-15 NOTE — Progress Notes (Signed)
Location:   [facility] Room Number: 160/P Place of Service:  SNF (31) Provider:  Leana Roe, MD  Patient Care Team: Sharilyn Sites, MD as PCP - General (Family Medicine) Daneil Dolin, MD (Gastroenterology)  Extended Emergency Contact Information Primary Emergency Contact: Mcray,Carol Address: [address]          [place], [place] [address] Johnnette Litter of [place] Phone: [phone] Mobile Phone: [phone] Relation: Spouse  Code Status:  Full Code Goals of care: Advanced Directive information Advanced Directives 09/15/2015  Does patient have an advance directive? Yes  Type of Advance Directive (No Data)  Does patient want to make changes to advanced directive? No - Patient declined  Copy of advanced directive(s) in chart? Yes  Would patient like information on creating an advanced directive? -     Chief Complaint  Patient presents with  . Acute Visit    Anticoagulation, Muscle Spasms    HPI:  Pt is a 75 y.o. male seen today for an acute visit for Implants of muscle spasms also questions of anticoagulation management-patient is currently on Lovenox apparently preferred this to aspirin in the hospital for DVT prophylaxis with his history of recent right hip fracture and closed right wrist reduction and a fracture was surgically corrected with an IM nailing.  However patient tells me today he would prefer the aspirin and new will switch him to this.  Is also complaining of muscle spasms at times at gripping cramping type pain-apparently he is not asking for his Robaxin he has this ordered for muscle spasms and I have encouraged him to take this.  Other than that he has no complaints he is looking forward to going home and is motivated to try to get home.  He is not complaining of any shortness of breath numbness tingling he says the hip pain itself as well as his right wrist is controlled but he does feel he is having again  spasm type pain   Past Medical History  Diagnosis Date  . COPD (chronic obstructive pulmonary disease) ([facility])   . ETOH abuse   . Cirrhosis ([facility])     confirmed by MRI on 07/04/11.  no immunizations  . Hyperlipidemia   . Depression with anxiety   . Anxiety   . Arthritis    Past Surgical History  Procedure Laterality Date  . Knee surgery      left-arthroscopy-[facility]  . Skull fracture elevation      fractured cheeck bone repaired  . Broken arm  6 yrs ago    left otif of wrist-[facility]  . External ear surgery      right ear-cleaned out ear and created new eardrum  . Wisdom tooth extraction    . Bilateral cataract surgery      [facility]  . Colonoscopy  1996    [facility]: external hemorrhoids, no polyps  . Colonoscopy  06/28/2011    Dr. Ala Bent diverticulosis,tubular adenoma, hyperplastic polyp  . Esophagogastroduodenoscopy  06/28/2011    Dr. Chelsea Aus erosive reflux esophagitis, hiatal hernia-gastritis  . Inguinal hernia repair  2-3 yrs ago    left-[facility]_  . 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 Intramedullary (im) nail intertrochanteric Right 09/02/2015    Procedure: RIGHT  INTERTROCHANTRIC HIP;  Surgeon: Renette Butters, MD;  Location: [facility];  Service: Orthopedics;  Laterality:  Right;  With MAC  . Closed reduction wrist fracture Right 09/02/2015    Procedure: RIGHT WRIST REDUCTION;  Surgeon: Renette Butters, MD;  Location: [facility];  Service: Orthopedics;  Laterality: Right;  with MAC    No Known Allergies  Outpatient Encounter Prescriptions as of 09/15/2015  Medication Sig  . ALPRAZolam (XANAX) 0.25 MG tablet Take one tablet every 12 hours as needed for increased anxiety/nervousness, tearfulness.  . Cholecalciferol (VITAMIN D PO) Take 1 capsule by mouth every morning. 1000 units  . docusate sodium (COLACE) 100 MG capsule Take 1 capsule (100  mg total) by mouth 2 (two) times daily.  Marland Kitchen enoxaparin (LOVENOX) 40 MG/0.4ML injection Inject 0.4 mLs (40 mg total) into the skin daily.  . ferrous sulfate (QC FERROUS SULFATE) 325 (65 FE) MG tablet Take 325 mg by mouth 2 (two) times daily with a meal.  . HYDROcodone-acetaminophen (NORCO) 5-325 MG tablet Take 1-2 tablets by mouth every 4 (four) hours as needed for moderate pain.  . methocarbamol (ROBAXIN) 500 MG tablet Take 500 mg by mouth every 12 (twelve) hours as needed for muscle spasms. Do not take with Xanax  . ondansetron (ZOFRAN) 4 MG tablet Take 1 tablet (4 mg total) by mouth every 8 (eight) hours as needed for nausea or vomiting.  . OXYGEN Oxygen @@ 2 L/ mn via [place] and wean for oxygen saturation > 92 %  . senna (SENOKOT) 8.6 MG TABS tablet Take 2 tablets (17.2 mg total) by mouth daily.  Marland Kitchen tiotropium (SPIRIVA) 18 MCG inhalation capsule Place 18 mcg into inhaler and inhale every morning.   . vitamin B-12 500 MCG tablet Take 1 tablet (500 mcg total) by mouth daily.   No facility-administered encounter medications on file as of 09/15/2015.      Review of Systems   Gen. is no complaints of fever chills.  Skin is not complaining of rashes or itching has covering of his right hip surgical site.  Head ears eyes nose mouth and throat does not complain of visual changes or sore throat.  Respiration a complaint of shortness breath or cough.  Cardiac no chest pain has trace lower extremity edema.  GI does not complain of nausea vomiting diarrhea or constipation.  GU does not complain of dysuria.  Muscle skeletal is complaining of muscle spasm type pain lower right extremity but is not complaining of acute joint pain or hip pain at this time.  Neurologic is not complaining of dizziness headache or syncope or numbness.  Psych is not complaining of depression or overt anxiety does have a history of anxiety at times.   Immunization History  Administered Date(s) Administered  .  Influenza Split 12/20/2011   Pertinent  Health Maintenance Due  Topic Date Due  . PNA vac Low Risk Adult (1 of 2 - PCV13) 05/18/2006  . INFLUENZA VACCINE  09/28/2015  . COLONOSCOPY  06/27/2021   No flowsheet data found. Functional Status Survey:    Filed Vitals:   09/15/15 1326  BP: 159/71  Pulse: 69  Temp: 97.6 F (36.4 C)  TempSrc: Oral  Resp: 20   There is no weight on file to calculate BMI. Physical Exam   In general this is a pleasant elderly male in no distress sitting comfortably in his wheelchair.  His skin is warm and dry he does have some bruising of his upper extremities most no smoke this appears to be somewhat chronic and not acutely new.  Eyes pupils appear reactive light sclera and  conjunctiva are clear he has prescription lenses.  Oropharynx clear mucous membranes moist E has dentures.  Chest is clear to auscultation there is no labored breathing.  Heart is regular rate and rhythm-she has mild lower extremity edema more so the right foot pedal pulses intact mild edema is not erythematous or tender.  Abdomen is soft nontender positive bowel sounds.  Musculoskeletal he has bandaging over the right hip surgical site limited range of motion of his right leg secondary to the recent surgery otherwise strength appears intact other 3 extremities with arthritic changes-she does have a soft cast over his right lower arm area capillary refill is intact he is able to move his fingers they are warm.  Neurologic is grossly intact no lateralizing findings speech is clear.  Psyche is oriented to self is pleasant and appropriate was able to tell me Fleeta Emmer was president and the year was 2017  Labs reviewed:  Recent Labs  09/01/15 2020 09/02/15 1620 09/03/15 0621 09/05/15 0602  NA 132*  --  131* 133*  K 4.2  --  3.9 3.5  CL 95*  --  100* 100*  CO2 30  --  25 26  GLUCOSE 101*  --  93 108*  BUN 11  --  10 10  CREATININE 1.02 0.83 0.72 0.71  CALCIUM 8.9  --  8.4*  8.2*  MG  --   --  1.6*  --   PHOS  --   --  2.9  --     Recent Labs  09/01/15 2020  AST 31  ALT 22  ALKPHOS 68  BILITOT 1.5*  PROT 7.2  ALBUMIN 4.1    Recent Labs  09/01/15 2020  09/05/15 0602 09/06/15 0344 09/09/15 0745  WBC 6.6  < > 3.6* 4.8 3.3*  NEUTROABS 5.2  --   --   --   --   HGB 13.2  < > 8.5* 9.8* 8.4*  HCT 38.1*  < > 24.8* 28.7* 24.2*  MCV 97.4  < > 98.0 97.0 98.0  PLT 154  < > 121* 89* 200  < > = values in this interval not displayed. No results found for: TSH No results found for: HGBA1C No results found for: CHOL, HDL, LDLCALC, LDLDIRECT, TRIG, CHOLHDL  Significant Diagnostic Results in last 30 days:  Wrist 2 Views Right  09/02/2015  CLINICAL DATA:  Distal radial fracture, in plaster views EXAM: RIGHT WRIST - 2 VIEW COMPARISON:  Pre reduction images dated September 01, 2015 FINDINGS: The patient has undergone close reduction of the distal radial metaphyseal fracture. There remains mild angulation and impaction. The fracture line reaches the articular surface of the distal radius. The adjacent ulna is intact. IMPRESSION: Stable positioning of the comminuted angulated impacted fracture of the distal right radial metaphysis. The radiocarpal joint is involved. Electronically Signed   By: David  Martinique M.D.   On: 09/02/2015 10:52   Dg Wrist Complete Right  09/01/2015  CLINICAL DATA:  Fall in his kitchen after becoming dizzy after standing up. Right wrist pain and deformity. EXAM: RIGHT WRIST - COMPLETE 3+ VIEW COMPARISON:  None. FINDINGS: Impacted mildly comminuted and angulated distal radius fracture with mild apex volar angulation. Fracture extends to the distal radial ulnar joint. There is likely radiocarpal extension. Nondisplaced ulna styloid fracture. Associated soft tissue edema about the wrist IMPRESSION: Comminuted angulated impacted distal radius fracture extending into the distal radial ulnar and radiocarpal joints. Nondisplaced ulna styloid fracture. Electronically  Signed   By: Threasa Beards  Ehinger M.D.   On: 09/01/2015 19:41   Dg Chest Port 1 View  09/01/2015  CLINICAL DATA:  Right hip fracture.  Preoperative examination. EXAM: PORTABLE CHEST 1 VIEW COMPARISON:  PA and lateral chest 02/22/2009. FINDINGS: The chest is somewhat hyperexpanded but the lungs are clear. Heart size is normal. No pneumothorax or pleural effusion. IMPRESSION: Pulmonary hyperexpansion suggestive of emphysema.  No acute disease. Electronically Signed   By: Inge Rise M.D.   On: 09/01/2015 20:27   Dg Hip Operative Unilat W Or W/o Pelvis Right  09/02/2015  CLINICAL DATA:  ORIF. EXAM: OPERATIVE RIGHT HIP (WITH PELVIS IF PERFORMED) 3 VIEWS TECHNIQUE: Fluoroscopic spot image(s) were submitted for interpretation post-operatively. COMPARISON:  09/01/2015. FINDINGS: ORIF right intertrochanteric hip fracture. Good anatomic alignment. Hardware intact. Peripheral vascular calcification. IMPRESSION: 1.  ORIF right hip fracture good anatomic alignment. 2. Peripheral vascular disease . Electronically Signed   By: Marcello Moores  Register   On: 09/02/2015 08:51   Dg Hip Unilat With Pelvis 2-3 Views Right  09/01/2015  CLINICAL DATA:  Right hip pain after fall today. Fall after becoming dizzy and his kitchen. EXAM: DG HIP (WITH OR WITHOUT PELVIS) 2-3V RIGHT COMPARISON:  Left hip radiographs 06/11/2015 FINDINGS: Mildly displaced minimally comminuted right hip intertrochanteric fracture. Femoral head remains seated in the acetabulum. 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 Pubic symphysis and sacroiliac joints remain congruent. Left hip prosthesis partially included. Atherosclerotic calcifications are seen. IMPRESSION: Mildly displaced right hip intertrochanteric fracture. Electronically Signed   By: Jeb Levering M.D.   On: 09/01/2015 19:44    Assessment/Plan  #1 muscle spasms-she does have an order for Robaxin every 12 hours when necessary have encouraged him to ask for this-also will check his  electrolytes to make sure they are stable-he continues on Vicodin every 4 hours as needed for pain as well.  #2 anemia most recent hemoglobin of 8.4 appears to be at the lower end of his recent baseline which is ranged up to 9.8-he is now on iron-we will update this as well.  #3 history of DVT prophylaxis anticoagulation he is on Lovenox he would prefer to be on aspirin at this point and will start him on the aspirin 325 mg daily.  He does have some history thrombocytopenia but this appears to have responded nicely is at 200,000 at this point again will update this lab as well I do not see any increased bruising or bleeding that appears new.  [place], [place], [place]

## 2015-09-16 ENCOUNTER — Encounter (HOSPITAL_COMMUNITY)
Admission: RE | Admit: 2015-09-16 | Discharge: 2015-09-16 | Disposition: A | Discharge status: Home / Self Care | Payer: PPO | Source: Skilled Nursing Facility | Attending: Internal Medicine | Admitting: Internal Medicine

## 2015-09-16 LAB — CBC WITH DIFFERENTIAL/PLATELET
Basophils Absolute: 0 10*3/uL (ref 0.0–0.1)
Basophils Relative: 0 %
Eosinophils Absolute: 0.1 10*3/uL (ref 0.0–0.7)
Eosinophils Relative: 4 %
HCT: 29.3 % — ABNORMAL LOW (ref 39.0–52.0)
Hemoglobin: 9.7 g/dL — ABNORMAL LOW (ref 13.0–17.0)
Lymphocytes Relative: 21 %
Lymphs Abs: 0.7 10*3/uL (ref 0.7–4.0)
MCH: 33.2 pg (ref 26.0–34.0)
MCHC: 33.1 g/dL (ref 30.0–36.0)
MCV: 100.3 fL — ABNORMAL HIGH (ref 78.0–100.0)
Monocytes Absolute: 0.5 10*3/uL (ref 0.1–1.0)
Monocytes Relative: 14 %
Neutro Abs: 1.9 10*3/uL (ref 1.7–7.7)
Neutrophils Relative %: 61 %
Platelets: 253 10*3/uL (ref 150–400)
RBC: 2.92 MIL/uL — ABNORMAL LOW (ref 4.22–5.81)
RDW: 16.1 % — ABNORMAL HIGH (ref 11.5–15.5)
WBC: 3.2 10*3/uL — ABNORMAL LOW (ref 4.0–10.5)

## 2015-09-16 LAB — BASIC METABOLIC PANEL
Anion gap: 7 (ref 5–15)
BUN: 11 mg/dL (ref 6–20)
CO2: 32 mmol/L (ref 22–32)
Calcium: 8.9 mg/dL (ref 8.9–10.3)
Chloride: 96 mmol/L — ABNORMAL LOW (ref 101–111)
Creatinine, Ser: 0.67 mg/dL (ref 0.61–1.24)
GFR calc Af Amer: >60 mL/min (ref >60)
GFR calc non Af Amer: >60 mL/min (ref >60)
Glucose, Bld: 93 mg/dL (ref 65–99)
Potassium: 3.8 mmol/L (ref 3.5–5.1)
Sodium: 135 mmol/L (ref 135–145)

## 2015-09-17 ENCOUNTER — Other Ambulatory Visit: Payer: Self-pay | Admitting: *Deleted

## 2015-09-17 MED ORDER — HYDROCODONE-ACETAMINOPHEN 5-325 MG PO TABS: 1.0000 | ORAL_TABLET | ORAL | Status: DC | PRN

## 2015-09-17 NOTE — Telephone Encounter (Signed)
[facility] 

## 2015-09-28 DIAGNOSIS — J449 Chronic obstructive pulmonary disease, unspecified: Secondary | ICD-10-CM | POA: Diagnosis not present

## 2015-09-28 DIAGNOSIS — S52501D Unspecified fracture of the lower end of right radius, subsequent encounter for closed fracture with routine healing: Secondary | ICD-10-CM | POA: Diagnosis not present

## 2015-09-28 DIAGNOSIS — D696 Thrombocytopenia, unspecified: Secondary | ICD-10-CM | POA: Diagnosis not present

## 2015-09-28 DIAGNOSIS — Z7901 Long term (current) use of anticoagulants: Secondary | ICD-10-CM | POA: Diagnosis not present

## 2015-09-28 DIAGNOSIS — Z720 Tobacco use: Secondary | ICD-10-CM | POA: Diagnosis not present

## 2015-09-28 DIAGNOSIS — Z471 Aftercare following joint replacement surgery: Secondary | ICD-10-CM | POA: Diagnosis not present

## 2015-09-28 DIAGNOSIS — S72141D Displaced intertrochanteric fracture of right femur, subsequent encounter for closed fracture with routine healing: Secondary | ICD-10-CM | POA: Diagnosis not present

## 2015-09-28 DIAGNOSIS — K7030 Alcoholic cirrhosis of liver without ascites: Secondary | ICD-10-CM | POA: Diagnosis not present

## 2015-09-28 DIAGNOSIS — Z9181 History of falling: Secondary | ICD-10-CM | POA: Diagnosis not present

## 2015-09-28 DIAGNOSIS — I10 Essential (primary) hypertension: Secondary | ICD-10-CM | POA: Diagnosis not present

## 2015-09-28 DIAGNOSIS — D500 Iron deficiency anemia secondary to blood loss (chronic): Secondary | ICD-10-CM | POA: Diagnosis not present

## 2015-09-28 DIAGNOSIS — F10920 Alcohol use, unspecified with intoxication, uncomplicated: Secondary | ICD-10-CM | POA: Diagnosis not present

## 2015-09-28 DIAGNOSIS — R293 Abnormal posture: Secondary | ICD-10-CM | POA: Diagnosis not present

## 2015-09-28 DIAGNOSIS — M6281 Muscle weakness (generalized): Secondary | ICD-10-CM | POA: Diagnosis not present

## 2015-09-28 DIAGNOSIS — S52611D Displaced fracture of right ulna styloid process, subsequent encounter for closed fracture with routine healing: Secondary | ICD-10-CM | POA: Diagnosis not present

## 2015-09-30 DIAGNOSIS — S72141D Displaced intertrochanteric fracture of right femur, subsequent encounter for closed fracture with routine healing: Secondary | ICD-10-CM | POA: Diagnosis not present

## 2015-10-01 DIAGNOSIS — S72141D Displaced intertrochanteric fracture of right femur, subsequent encounter for closed fracture with routine healing: Secondary | ICD-10-CM | POA: Diagnosis not present

## 2015-10-01 DIAGNOSIS — M25531 Pain in right wrist: Secondary | ICD-10-CM | POA: Diagnosis not present

## 2015-10-04 ENCOUNTER — Telehealth: Payer: Self-pay | Admitting: Orthopedic Surgery

## 2015-10-04 NOTE — Telephone Encounter (Signed)
Dr. Aline Brochure, I was contacted by [facility] this morning for this patient. They were wanting to setup a two week followup appointment with you, I asked them to send [place] their notes and I have placed them in your box for review.  Dr. [name] did right hip surgery on [name] on 09/02/15 and he did right wrist reduction. [name] is at the [facility] and they have called to setup a two week followup for him.  Please review the notes and advise.

## 2015-10-05 NOTE — Telephone Encounter (Signed)
CALLED DR [facility]'S OFFICE   HE IS TO CALL ME WITH DIRECTIONS FOR THE VISIT

## 2015-10-06 ENCOUNTER — Other Ambulatory Visit: Payer: Self-pay | Admitting: *Deleted

## 2015-10-06 MED ORDER — HYDROCODONE-ACETAMINOPHEN 5-325 MG PO TABS: 1.0000 | ORAL_TABLET | ORAL | 0 refills | Status: DC | PRN

## 2015-10-06 NOTE — Telephone Encounter (Signed)
[facility] #[phone] Fax: [phone]   

## 2015-10-11 ENCOUNTER — Encounter: Payer: Self-pay | Admitting: Internal Medicine

## 2015-10-11 ENCOUNTER — Non-Acute Institutional Stay (SKILLED_NURSING_FACILITY): Payer: PPO | Admitting: Internal Medicine

## 2015-10-11 DIAGNOSIS — Z96649 Presence of unspecified artificial hip joint: Secondary | ICD-10-CM

## 2015-10-11 DIAGNOSIS — Z9660 Presence of unspecified orthopedic joint implant: Secondary | ICD-10-CM

## 2015-10-11 DIAGNOSIS — D696 Thrombocytopenia, unspecified: Secondary | ICD-10-CM | POA: Diagnosis not present

## 2015-10-11 DIAGNOSIS — D62 Acute posthemorrhagic anemia: Secondary | ICD-10-CM

## 2015-10-11 DIAGNOSIS — S52501D Unspecified fracture of the lower end of right radius, subsequent encounter for closed fracture with routine healing: Secondary | ICD-10-CM

## 2015-10-11 NOTE — Progress Notes (Signed)
Location:   [facility] Room Number: 160/P Place of Service:  SNF (31)  Provider: Granville Lewis  PCP: Purvis Kilts, MD Patient Care Team: Sharilyn Sites, MD as PCP - General (Family Medicine) Daneil Dolin, MD (Gastroenterology)  Extended Emergency Contact Information Primary Emergency Contact: Hornbaker,Carol Address: [address]          [place], [place] [address] Johnnette Litter of [place] Phone: [phone] Mobile Phone: [phone] Relation: Spouse  Code Status: Full Code Goals of care:  Advanced Directive information Advanced Directives 10/11/2015  Does patient have an advance directive? Yes  Type of Advance Directive (No Data)  Does patient want to make changes to advanced directive? No - Patient declined  Copy of advanced directive(s) in chart? Yes  Would patient like information on creating an advanced directive? -  Pre-existing out of facility DNR order (yellow form or pink MOST form) -     No Known Allergies  Chief Complaint  Patient presents with  . Discharge Note    HPI:  75 y.o. male  is being seen for possible discharge later this week-she is been here for rehabilitation after sustaining a right hip fracture after a fall- He sustained also a closed right wrist fracture as well in had this reduced as well in early July.  He was here for rehabilitation and has done well with this he is ambulating at times with a walker-she is slated to have a home health evaluation later this week NF this is satisfactory he will be discharged apparently.  He does not report any issues today---he continues on aspirin for DVT prophylaxis he had been on Lovenox at one point preferred this but decided he would prefer aspirin shortly after he arrived here and we did switch this.  He is receiving Vicodin as needed for pain this appears stable.  He did appear to have some postop anemia but this appears improved with a hemoglobin of 9.7 on lab done on  09/16/2015.  He is also on B12 supplementation.  He will be going home he does live with his wife and has a son who lives nearby as well as he will need continued PT and OT for strengthening as well as home health support.  Vital signs appear to be stable recent blood pressures 100/57-131/75-116/64. He did have a history of apparently transitory thrombocytopenia but on most recent lab his platelets 153,000 on 09/16/2015. Per their is been no increased bruising or bleeding noted.         Past Medical History:  Diagnosis Date  . Anxiety   . Arthritis   . Cirrhosis ([place])    confirmed by MRI on 07/04/11.  no immunizations  . COPD (chronic obstructive pulmonary disease) ([place])   . Depression with anxiety   . ETOH abuse   . Hyperlipidemia     Past Surgical History:  Procedure Laterality Date  . bilateral cataract surgery     [facility]  . broken arm  6 yrs ago   left otif of wrist-[facility]  . CLOSED REDUCTION WRIST FRACTURE Right 09/02/2015   Procedure: RIGHT WRIST REDUCTION;  Surgeon: Renette Butters, MD;  Location: [facility];  Service: Orthopedics;  Laterality: Right;  with MAC  . COLONOSCOPY  1996   [facility]: external hemorrhoids, no polyps  . COLONOSCOPY  06/28/2011   Dr. Ala Bent diverticulosis,tubular adenoma, hyperplastic polyp  . ESOPHAGOGASTRODUODENOSCOPY  06/28/2011   Dr. Chelsea Aus erosive reflux esophagitis, hiatal hernia-gastritis  . EXTERNAL EAR SURGERY  right ear-cleaned out ear and created new eardrum  . INGUINAL HERNIA REPAIR  2-3 yrs ago   left-[facility]_  . INTRAMEDULLARY (IM) NAIL INTERTROCHANTERIC Right 09/02/2015   Procedure: RIGHT  INTERTROCHANTRIC HIP;  Surgeon: Renette Butters, MD;  Location: [facility];  Service: Orthopedics;  Laterality: Right;  With MAC  . KNEE SURGERY     left-arthroscopy-[facility]  . SKULL FRACTURE ELEVATION     fractured cheeck bone repaired  . TOTAL HIP ARTHROPLASTY  12/18/2011   Procedure: TOTAL HIP ARTHROPLASTY;  Surgeon: Carole Civil, MD;  Location: [facility];  Service: Orthopedics;  Laterality: Left;  . WISDOM TOOTH EXTRACTION        reports that he has been smoking Cigarettes.  He has a 30.00 pack-year smoking history. He has never used smokeless tobacco. He reports that he drinks alcohol. He reports that he does not use drugs. Social History   Social History  . Marital status: Married    Spouse name: N/A  . Number of children: N/A  . Years of education: N/A   Occupational History  . retired Engineer, maintenance   Social History Main Topics  . Smoking status: Current Every Day Smoker    Packs/day: 0.50    Years: 60.00    Types: Cigarettes  . Smokeless tobacco: Never Used     Comment: 1/2 ppd > 60 years  . Alcohol use Yes     Comment:  4 beer/day  . Drug use: No  . Sexual activity: Yes    Birth control/ protection: None   Other Topics Concern  . Not on file   Social History Narrative  . No narrative on file   Functional Status Survey:    No Known Allergies  Pertinent  Health Maintenance Due  Topic Date Due  . PNA vac Low Risk Adult (1 of 2 - PCV13) 05/18/2006  . INFLUENZA VACCINE  09/28/2015  . COLONOSCOPY  06/27/2021    Medications: Current Outpatient Prescriptions on File Prior to Visit  Medication Sig Dispense Refill  . ALPRAZolam (XANAX) 0.25 MG tablet Take one tablet every 12 hours as needed for increased anxiety/nervousness, tearfulness. 60 tablet 0  . Cholecalciferol (VITAMIN D PO) Take 1 capsule by mouth every morning. 1000 units    . docusate sodium (COLACE) 100 MG capsule Take 1 capsule (100 mg total) by mouth 2 (two) times daily. 60 capsule 0  . ferrous sulfate (QC FERROUS SULFATE) 325 (65 FE) MG tablet Take 325 mg by mouth 2 (two) times daily with a meal.    . HYDROcodone-acetaminophen (NORCO) 5-325 MG tablet Take 1-2 tablets by mouth every 4 (four) hours as needed for moderate pain. Max APAP 3gm/24hours from all sources 180 tablet 0  . methocarbamol (ROBAXIN) 500 MG  tablet Take 500 mg by mouth every 12 (twelve) hours as needed for muscle spasms. Do not take with Xanax    . ondansetron (ZOFRAN) 4 MG tablet Take 1 tablet (4 mg total) by mouth every 8 (eight) hours as needed for nausea or vomiting. 40 tablet 0  . OXYGEN Oxygen @@ 2 L/ mn via [place] and wean for oxygen saturation > 92 %    . senna (SENOKOT) 8.6 MG TABS tablet Take 2 tablets (17.2 mg total) by mouth daily. 120 each 0  . tiotropium (SPIRIVA) 18 MCG inhalation capsule Place 18 mcg into inhaler and inhale every morning.     . vitamin B-12 500 MCG tablet Take 1 tablet (500 mcg total) by  mouth daily. 30 tablet 0   No current facility-administered medications on file prior to visit.   Of note is also on aspirin 325 mg enteric-coated daily   Review of Systems   In general no complaints of fever or chills says he feels well is looking forward to go home.  Skin does not complain of increased bruising from baseline or any rashes.  Head ears eyes nose mouth and throat does not complaining of sore throat or visual changes.  Respiratory history of COPD but does not complaining of shortness of breath or cough.  Cardiac does not complain of chest pain palpitations or edema.  GI is not complaining of abdominal discomfort nausea vomiting diarrhea or constipation.  GU does not complaining of dysuria.  Muscle skeletal has orthopedic issues as noted above her pain appears to be controlled with Vicodin.  He also has a muscle relaxer as needed for plates a muscle spasm discomfort in the past.  Neurologic is not complaining of dizziness headache or numbness in.  Psych does have a history of anxiety has when necessary Xanax although its use is trying to be minimized with his history of falls and apparently also history of alcohol use.      Vitals:   10/11/15 0959  BP: (!) 100/57  Pulse: 64  Resp: 19  Temp: 97.6 F (36.4 C)  TempSrc: Oral  Weight: 146 lb (66.2 kg)   Body mass index is 20.36  kg/m. Physical Exam  In general this is a pleasant elderly male in no distress sitting V in his wheelchair.  His skin is warm and dry surgical site per nursing is well-healed on the right hip this was not assessed secondary to patient positioning today but apparently has healed unremarkably also has a well-healed scar on the other shin with the previous history of left hip repair.  Eyes pupils appear reactive light sclera and conjunctiva are clear visual acuity appears grossly intact.  Oropharynx is clear mucous membranes moist he has dentures.  Chest is clear to auscultation with somewhat shallow air entry no labored breathing.  Heart is distant heart sounds regular rate and rhythm I could not appreciate a murmur L a rub he does not have significant lower extremity edema-pedal pulses intact.  Abdomen is soft nontender with positive bowel sounds.  Musculoskeletal is able to move all extremities 4 again he does have bandaging of his right lower arm fingers are exposed capillary re fill is intact on the right he is able to move his fingers they are warm to touch.  Does have arthritic changes of his knees bilaterally.  Left upper extremity strength appears to be preserved he does not appear to have significant pain when ambulating in the wheelchair  Neurologic is grossly intact his speech is clear.  Psych he is alert and oriented very pleasant and appropriate   Labs reviewed: Basic Metabolic Panel:  Recent Labs  09/03/15 0621 09/05/15 0602 09/16/15 0800  NA 131* 133* 135  K 3.9 3.5 3.8  CL 100* 100* 96*  CO2 25 26 32  GLUCOSE 93 108* 93  BUN '10 10 11  '$ CREATININE 0.72 0.71 0.67  CALCIUM 8.4* 8.2* 8.9  MG 1.6*  --   --   PHOS 2.9  --   --    Liver Function Tests:  Recent Labs  09/01/15 2020  AST 31  ALT 22  ALKPHOS 68  BILITOT 1.5*  PROT 7.2  ALBUMIN 4.1   No results for input(s): LIPASE,  AMYLASE in the last 8760 hours. No results for input(s): AMMONIA in the  last 8760 hours. CBC:  Recent Labs  09/01/15 2020  09/06/15 0344 09/09/15 0745 09/16/15 0800  WBC 6.6  < > 4.8 3.3* 3.2*  NEUTROABS 5.2  --   --   --  1.9  HGB 13.2  < > 9.8* 8.4* 9.7*  HCT 38.1*  < > 28.7* 24.2* 29.3*  MCV 97.4  < > 97.0 98.0 100.3*  PLT 154  < > 89* 200 253  < > = values in this interval not displayed. Cardiac Enzymes: No results for input(s): CKTOTAL, CKMB, CKMBINDEX, TROPONINI in the last 8760 hours. BNP: Invalid input(s): POCBNP CBG: No results for input(s): GLUCAP in the last 8760 hours.  Procedures and Imaging Studies During Stay: No results found.  Assessment/Plan:    History of right hip fracture and right wrist fracture-she appears to be recovering fairly well from this he is followed by orthopedics will need follow-up as indicated-will need PT and OT at home as well-again they are doing a home health evaluation later this week before discharge-pain at this point appears controlled with the Vicodin. Continues on aspirin for anticoagulation  #2 history of anemia thought to have an element of postoperative did dip down into the eights but now is up to 9.7 most recent lab will update this.  History of thrombocytopenia this appears to have been transitory as noted above will update this as well.  #4-history of anxiety continues on when necessary Xanax we'll try to minimize the use secondary to his history of falls as well as alcohol use in the past-I suspect this will continued be somewhat of a challenge e at home but clinically is been stable during his stay here.  #5-history of snoring-word I suspect benefit from a sleep apnea evaluation will defer to primary care provider since she most likely is about to be discharged.  Again patient will need PT and OT as well as nursing support-will need a wheelchair as well as rolling walker secondary to fall risk with history of recent fractures.  Also will need followed by orthopedics.  [id number] note  greater than 30 minutes spent on this discharge summary-including evaluating patient-discussing his status with nursing staff-reviewing his chart-his labs-and coordinating plan of care-of note greater than 50% of time spent coordinating plan of care     Future labs/tests needed:

## 2015-10-12 ENCOUNTER — Inpatient Hospital Stay (INDEPENDENT_AMBULATORY_CARE_PROVIDER_SITE_OTHER): Payer: PPO

## 2015-10-12 ENCOUNTER — Other Ambulatory Visit (HOSPITAL_COMMUNITY)
Admission: RE | Admit: 2015-10-12 | Discharge: 2015-10-12 | Disposition: A | Discharge status: Home / Self Care | Payer: PPO | Source: Skilled Nursing Facility | Attending: Internal Medicine | Admitting: Internal Medicine

## 2015-10-12 ENCOUNTER — Ambulatory Visit (INDEPENDENT_AMBULATORY_CARE_PROVIDER_SITE_OTHER): Payer: PPO | Admitting: Orthopedic Surgery

## 2015-10-12 ENCOUNTER — Encounter: Payer: Self-pay | Admitting: Orthopedic Surgery

## 2015-10-12 VITALS — BP 150/68 | HR 67 | Ht 70.0 in

## 2015-10-12 DIAGNOSIS — S52501D Unspecified fracture of the lower end of right radius, subsequent encounter for closed fracture with routine healing: Secondary | ICD-10-CM | POA: Diagnosis not present

## 2015-10-12 DIAGNOSIS — S72141D Displaced intertrochanteric fracture of right femur, subsequent encounter for closed fracture with routine healing: Secondary | ICD-10-CM

## 2015-10-12 DIAGNOSIS — Z4789 Encounter for other orthopedic aftercare: Secondary | ICD-10-CM

## 2015-10-12 LAB — BASIC METABOLIC PANEL
Anion gap: 9 (ref 5–15)
BUN: 28 mg/dL — ABNORMAL HIGH (ref 6–20)
CO2: 29 mmol/L (ref 22–32)
Calcium: 9.4 mg/dL (ref 8.9–10.3)
Chloride: 99 mmol/L — ABNORMAL LOW (ref 101–111)
Creatinine, Ser: 0.74 mg/dL (ref 0.61–1.24)
GFR calc Af Amer: >60 mL/min (ref >60)
GFR calc non Af Amer: >60 mL/min (ref >60)
Glucose, Bld: 89 mg/dL (ref 65–99)
Potassium: 4.2 mmol/L (ref 3.5–5.1)
Sodium: 137 mmol/L (ref 135–145)

## 2015-10-12 LAB — CBC
HCT: 34.2 % — ABNORMAL LOW (ref 39.0–52.0)
Hemoglobin: 11.4 g/dL — ABNORMAL LOW (ref 13.0–17.0)
MCH: 33.1 pg (ref 26.0–34.0)
MCHC: 33.3 g/dL (ref 30.0–36.0)
MCV: 99.4 fL (ref 78.0–100.0)
Platelets: 233 10*3/uL (ref 150–400)
RBC: 3.44 MIL/uL — ABNORMAL LOW (ref 4.22–5.81)
RDW: 14.9 % (ref 11.5–15.5)
WBC: 4.5 10*3/uL (ref 4.0–10.5)

## 2015-10-12 NOTE — Progress Notes (Signed)
Right hip surgery done in [facility] by Dr. Percell Miller  Also had closed reduction of distal radius fracture  Patient is at a local nursing home  Dr. Percell Miller has asked that we obtain x-rays and alert him of any problems related to the surgery or close reduction.  Right hip flexion is normal no pain. His limb alignment is normal leg lengths are equal.  His right wrist shows mild deformity but his hand flexion and finger motion is normal  He is placed in a removable wrist splint on the right  He can weight-bear as tolerated as well as on the hip and x-ray will be done of his hip and femur in 5 weeks  Fax forwarded to Dr. Percell Miller

## 2015-10-21 DIAGNOSIS — J449 Chronic obstructive pulmonary disease, unspecified: Secondary | ICD-10-CM | POA: Diagnosis not present

## 2015-10-21 DIAGNOSIS — Z6821 Body mass index (BMI) 21.0-21.9, adult: Secondary | ICD-10-CM | POA: Diagnosis not present

## 2015-10-21 DIAGNOSIS — Z1389 Encounter for screening for other disorder: Secondary | ICD-10-CM | POA: Diagnosis not present

## 2015-10-21 DIAGNOSIS — F1020 Alcohol dependence, uncomplicated: Secondary | ICD-10-CM | POA: Diagnosis not present

## 2015-10-21 DIAGNOSIS — S72001S Fracture of unspecified part of neck of right femur, sequela: Secondary | ICD-10-CM | POA: Diagnosis not present

## 2015-10-26 DIAGNOSIS — Z9181 History of falling: Secondary | ICD-10-CM | POA: Diagnosis not present

## 2015-10-26 DIAGNOSIS — D500 Iron deficiency anemia secondary to blood loss (chronic): Secondary | ICD-10-CM | POA: Diagnosis not present

## 2015-10-26 DIAGNOSIS — Z96642 Presence of left artificial hip joint: Secondary | ICD-10-CM | POA: Diagnosis not present

## 2015-10-26 DIAGNOSIS — S52501D Unspecified fracture of the lower end of right radius, subsequent encounter for closed fracture with routine healing: Secondary | ICD-10-CM | POA: Diagnosis not present

## 2015-10-26 DIAGNOSIS — F1010 Alcohol abuse, uncomplicated: Secondary | ICD-10-CM | POA: Diagnosis not present

## 2015-10-26 DIAGNOSIS — J449 Chronic obstructive pulmonary disease, unspecified: Secondary | ICD-10-CM | POA: Diagnosis not present

## 2015-10-26 DIAGNOSIS — S52611D Displaced fracture of right ulna styloid process, subsequent encounter for closed fracture with routine healing: Secondary | ICD-10-CM | POA: Diagnosis not present

## 2015-10-26 DIAGNOSIS — K7030 Alcoholic cirrhosis of liver without ascites: Secondary | ICD-10-CM | POA: Diagnosis not present

## 2015-10-26 DIAGNOSIS — Z87891 Personal history of nicotine dependence: Secondary | ICD-10-CM | POA: Diagnosis not present

## 2015-10-26 DIAGNOSIS — M1991 Primary osteoarthritis, unspecified site: Secondary | ICD-10-CM | POA: Diagnosis not present

## 2015-10-26 DIAGNOSIS — F418 Other specified anxiety disorders: Secondary | ICD-10-CM | POA: Diagnosis not present

## 2015-10-26 DIAGNOSIS — S72141D Displaced intertrochanteric fracture of right femur, subsequent encounter for closed fracture with routine healing: Secondary | ICD-10-CM | POA: Diagnosis not present

## 2015-10-27 ENCOUNTER — Telehealth: Payer: Self-pay | Admitting: *Deleted

## 2015-10-27 NOTE — Telephone Encounter (Signed)
OT WITH [facility] CALLED TO ASK ABOUT ANY PRECAUTIONS REGARDING THE WRIST AND HIP THERAPY

## 2015-10-27 NOTE — Telephone Encounter (Signed)
NONE  FULL WEIGHT BEARING FULL RANGE OF MOTION

## 2015-10-28 NOTE — Telephone Encounter (Signed)
Returned call, no answer, left detailed VM

## 2015-11-02 DIAGNOSIS — F418 Other specified anxiety disorders: Secondary | ICD-10-CM | POA: Diagnosis not present

## 2015-11-02 DIAGNOSIS — J449 Chronic obstructive pulmonary disease, unspecified: Secondary | ICD-10-CM | POA: Diagnosis not present

## 2015-11-02 DIAGNOSIS — F1010 Alcohol abuse, uncomplicated: Secondary | ICD-10-CM | POA: Diagnosis not present

## 2015-11-02 DIAGNOSIS — K7030 Alcoholic cirrhosis of liver without ascites: Secondary | ICD-10-CM | POA: Diagnosis not present

## 2015-11-02 DIAGNOSIS — S72141D Displaced intertrochanteric fracture of right femur, subsequent encounter for closed fracture with routine healing: Secondary | ICD-10-CM | POA: Diagnosis not present

## 2015-11-02 DIAGNOSIS — Z87891 Personal history of nicotine dependence: Secondary | ICD-10-CM | POA: Diagnosis not present

## 2015-11-02 DIAGNOSIS — D500 Iron deficiency anemia secondary to blood loss (chronic): Secondary | ICD-10-CM | POA: Diagnosis not present

## 2015-11-02 DIAGNOSIS — S52611D Displaced fracture of right ulna styloid process, subsequent encounter for closed fracture with routine healing: Secondary | ICD-10-CM | POA: Diagnosis not present

## 2015-11-02 DIAGNOSIS — Z9181 History of falling: Secondary | ICD-10-CM | POA: Diagnosis not present

## 2015-11-02 DIAGNOSIS — S52501D Unspecified fracture of the lower end of right radius, subsequent encounter for closed fracture with routine healing: Secondary | ICD-10-CM | POA: Diagnosis not present

## 2015-11-02 DIAGNOSIS — Z96642 Presence of left artificial hip joint: Secondary | ICD-10-CM | POA: Diagnosis not present

## 2015-11-02 DIAGNOSIS — M1991 Primary osteoarthritis, unspecified site: Secondary | ICD-10-CM | POA: Diagnosis not present

## 2015-11-09 DIAGNOSIS — S52611D Displaced fracture of right ulna styloid process, subsequent encounter for closed fracture with routine healing: Secondary | ICD-10-CM | POA: Diagnosis not present

## 2015-11-09 DIAGNOSIS — K7030 Alcoholic cirrhosis of liver without ascites: Secondary | ICD-10-CM | POA: Diagnosis not present

## 2015-11-09 DIAGNOSIS — J449 Chronic obstructive pulmonary disease, unspecified: Secondary | ICD-10-CM | POA: Diagnosis not present

## 2015-11-09 DIAGNOSIS — F1010 Alcohol abuse, uncomplicated: Secondary | ICD-10-CM | POA: Diagnosis not present

## 2015-11-09 DIAGNOSIS — D500 Iron deficiency anemia secondary to blood loss (chronic): Secondary | ICD-10-CM | POA: Diagnosis not present

## 2015-11-09 DIAGNOSIS — Z87891 Personal history of nicotine dependence: Secondary | ICD-10-CM | POA: Diagnosis not present

## 2015-11-09 DIAGNOSIS — S52501D Unspecified fracture of the lower end of right radius, subsequent encounter for closed fracture with routine healing: Secondary | ICD-10-CM | POA: Diagnosis not present

## 2015-11-09 DIAGNOSIS — Z96642 Presence of left artificial hip joint: Secondary | ICD-10-CM | POA: Diagnosis not present

## 2015-11-09 DIAGNOSIS — Z9181 History of falling: Secondary | ICD-10-CM | POA: Diagnosis not present

## 2015-11-09 DIAGNOSIS — M1991 Primary osteoarthritis, unspecified site: Secondary | ICD-10-CM | POA: Diagnosis not present

## 2015-11-09 DIAGNOSIS — S72141D Displaced intertrochanteric fracture of right femur, subsequent encounter for closed fracture with routine healing: Secondary | ICD-10-CM | POA: Diagnosis not present

## 2015-11-09 DIAGNOSIS — F418 Other specified anxiety disorders: Secondary | ICD-10-CM | POA: Diagnosis not present

## 2015-11-16 ENCOUNTER — Ambulatory Visit (INDEPENDENT_AMBULATORY_CARE_PROVIDER_SITE_OTHER): Payer: PPO | Admitting: Orthopedic Surgery

## 2015-11-16 ENCOUNTER — Other Ambulatory Visit: Payer: Self-pay | Admitting: Orthopedic Surgery

## 2015-11-16 ENCOUNTER — Ambulatory Visit (INDEPENDENT_AMBULATORY_CARE_PROVIDER_SITE_OTHER): Payer: PPO

## 2015-11-16 ENCOUNTER — Encounter: Payer: Self-pay | Admitting: Orthopedic Surgery

## 2015-11-16 VITALS — BP 143/77 | HR 59 | Ht 70.0 in

## 2015-11-16 DIAGNOSIS — S72142D Displaced intertrochanteric fracture of left femur, subsequent encounter for closed fracture with routine healing: Secondary | ICD-10-CM

## 2015-11-16 DIAGNOSIS — Z4789 Encounter for other orthopedic aftercare: Secondary | ICD-10-CM

## 2015-11-16 DIAGNOSIS — S7292XD Unspecified fracture of left femur, subsequent encounter for closed fracture with routine healing: Secondary | ICD-10-CM | POA: Diagnosis not present

## 2015-11-16 NOTE — Progress Notes (Signed)
Post op follow up   Chief Complaint  Patient presents with  . Follow-up    RIGHT FEMUR AND HIP FRACTURE CARE, DOS 09/02/15   BP (!) 143/77   Pulse (!) 59   Ht '5\' 10"'$  (1.778 m)   Right peritrochanteric femur fracture treated by Dr. Percell Miller. The patient wanted to stand [place] for follow-up to avoid the travel  He is at home now. He uses a cane in the house and a walker out of the house. He complains of pain in his right hip when he sitting for long periods of time but he says he can walk it off he is wondering about some pain medication for that  He still on aspirin for DVT prophylaxis  His x-ray today shows his fracture is stable he fixed with a full-length rod without a distal locking screw per protocol  Recommend some Tylenol  come back in a month  Also obtain total hip yearly film. (I did his total hip years ago) and then we will get the x-ray of his right femur as recorded today

## 2015-11-17 DIAGNOSIS — K7030 Alcoholic cirrhosis of liver without ascites: Secondary | ICD-10-CM | POA: Diagnosis not present

## 2015-11-17 DIAGNOSIS — F418 Other specified anxiety disorders: Secondary | ICD-10-CM | POA: Diagnosis not present

## 2015-11-17 DIAGNOSIS — S52611D Displaced fracture of right ulna styloid process, subsequent encounter for closed fracture with routine healing: Secondary | ICD-10-CM | POA: Diagnosis not present

## 2015-11-17 DIAGNOSIS — F1010 Alcohol abuse, uncomplicated: Secondary | ICD-10-CM | POA: Diagnosis not present

## 2015-11-17 DIAGNOSIS — S52501D Unspecified fracture of the lower end of right radius, subsequent encounter for closed fracture with routine healing: Secondary | ICD-10-CM | POA: Diagnosis not present

## 2015-11-17 DIAGNOSIS — Z9181 History of falling: Secondary | ICD-10-CM | POA: Diagnosis not present

## 2015-11-17 DIAGNOSIS — Z96642 Presence of left artificial hip joint: Secondary | ICD-10-CM | POA: Diagnosis not present

## 2015-11-17 DIAGNOSIS — M1991 Primary osteoarthritis, unspecified site: Secondary | ICD-10-CM | POA: Diagnosis not present

## 2015-11-17 DIAGNOSIS — D500 Iron deficiency anemia secondary to blood loss (chronic): Secondary | ICD-10-CM | POA: Diagnosis not present

## 2015-11-17 DIAGNOSIS — S72141D Displaced intertrochanteric fracture of right femur, subsequent encounter for closed fracture with routine healing: Secondary | ICD-10-CM | POA: Diagnosis not present

## 2015-11-17 DIAGNOSIS — Z87891 Personal history of nicotine dependence: Secondary | ICD-10-CM | POA: Diagnosis not present

## 2015-11-17 DIAGNOSIS — J449 Chronic obstructive pulmonary disease, unspecified: Secondary | ICD-10-CM | POA: Diagnosis not present

## 2015-11-30 ENCOUNTER — Ambulatory Visit: Payer: Self-pay | Admitting: Orthopedic Surgery

## 2015-12-14 ENCOUNTER — Ambulatory Visit (INDEPENDENT_AMBULATORY_CARE_PROVIDER_SITE_OTHER): Payer: PPO | Admitting: Orthopedic Surgery

## 2015-12-14 ENCOUNTER — Ambulatory Visit (INDEPENDENT_AMBULATORY_CARE_PROVIDER_SITE_OTHER): Payer: PPO

## 2015-12-14 ENCOUNTER — Ambulatory Visit: Payer: PPO

## 2015-12-14 ENCOUNTER — Encounter: Payer: Self-pay | Admitting: Orthopedic Surgery

## 2015-12-14 DIAGNOSIS — S52591D Other fractures of lower end of right radius, subsequent encounter for closed fracture with routine healing: Secondary | ICD-10-CM

## 2015-12-14 DIAGNOSIS — S72144D Nondisplaced intertrochanteric fracture of right femur, subsequent encounter for closed fracture with routine healing: Secondary | ICD-10-CM | POA: Diagnosis not present

## 2015-12-14 DIAGNOSIS — Z96642 Presence of left artificial hip joint: Secondary | ICD-10-CM

## 2015-12-14 NOTE — Progress Notes (Signed)
Patient ID: Ian Aguilar, male   DOB: 1940/07/01, 75 y.o.   MRN: [id number]  Chief Complaint  Patient presents with  . Follow-up    right femur fx, yearly xray left tka    HPI Ian Aguilar is a 75 y.o. male.   HPI History of left total hip 2013 annual x-ray. No problems with his hip Long right gamma nail right peritrochanteric fracture followed to alleviate patient's travel. Surgery was done in [place]. Review of Systems Review of Systems Normal neuro  Denies fever  Examination There were no vitals taken for this visit.  Gen. appearance the patient's appearance is normal with normal grooming and  hygiene The patient is oriented to person place and time Mood and affect are normal   Ortho Exam Gait is remarkable for use of a cane Left Hip flexion 120  Stability tests are normal  Motor exam 5/5 manual muscle testing , no atrophy  Skin is normal (no rash or erythema)  Right HIP restoration of hip flexion. Greater trochanteric bursa mild tenderness over the trochanter  Leg lengths restored with good rotation and alignment Right hip is stable motor exam is normal skin no rashes lesions or ulceration hip flexion normal strength. No instability in the right hip.  Distally no major peripheral edema right or left leg Medical decision-making Diagnosis, Data, Plan (risk)  Plain films hips stable  Annual film one year  Arther Abbott, MD 12/14/2015 2:24 PM

## 2015-12-31 DIAGNOSIS — Z0000 Encounter for general adult medical examination without abnormal findings: Secondary | ICD-10-CM | POA: Diagnosis not present

## 2015-12-31 DIAGNOSIS — J449 Chronic obstructive pulmonary disease, unspecified: Secondary | ICD-10-CM | POA: Diagnosis not present

## 2015-12-31 DIAGNOSIS — M169 Osteoarthritis of hip, unspecified: Secondary | ICD-10-CM | POA: Diagnosis not present

## 2015-12-31 DIAGNOSIS — Z6823 Body mass index (BMI) 23.0-23.9, adult: Secondary | ICD-10-CM | POA: Diagnosis not present

## 2015-12-31 DIAGNOSIS — Z23 Encounter for immunization: Secondary | ICD-10-CM | POA: Diagnosis not present

## 2016-01-03 DIAGNOSIS — Z1389 Encounter for screening for other disorder: Secondary | ICD-10-CM | POA: Diagnosis not present

## 2016-01-03 DIAGNOSIS — Z23 Encounter for immunization: Secondary | ICD-10-CM | POA: Diagnosis not present

## 2016-01-03 DIAGNOSIS — Z6823 Body mass index (BMI) 23.0-23.9, adult: Secondary | ICD-10-CM | POA: Diagnosis not present

## 2016-01-03 DIAGNOSIS — Z0000 Encounter for general adult medical examination without abnormal findings: Secondary | ICD-10-CM | POA: Diagnosis not present

## 2016-03-01 DIAGNOSIS — H40033 Anatomical narrow angle, bilateral: Secondary | ICD-10-CM | POA: Diagnosis not present

## 2016-03-01 DIAGNOSIS — G44219 Episodic tension-type headache, not intractable: Secondary | ICD-10-CM | POA: Diagnosis not present

## 2016-03-14 ENCOUNTER — Other Ambulatory Visit: Payer: Self-pay | Admitting: Orthopedic Surgery

## 2016-03-14 ENCOUNTER — Ambulatory Visit (INDEPENDENT_AMBULATORY_CARE_PROVIDER_SITE_OTHER): Payer: PPO

## 2016-03-14 ENCOUNTER — Ambulatory Visit (INDEPENDENT_AMBULATORY_CARE_PROVIDER_SITE_OTHER): Payer: PPO | Admitting: Orthopedic Surgery

## 2016-03-14 ENCOUNTER — Ambulatory Visit: Payer: PPO

## 2016-03-14 DIAGNOSIS — M545 Low back pain, unspecified: Secondary | ICD-10-CM

## 2016-03-14 DIAGNOSIS — T8484XA Pain due to internal orthopedic prosthetic devices, implants and grafts, initial encounter: Secondary | ICD-10-CM | POA: Diagnosis not present

## 2016-03-14 MED ORDER — METHOCARBAMOL 500 MG PO TABS: 500.0000 mg | ORAL_TABLET | Freq: Three times a day (TID) | ORAL | 2 refills | Status: DC

## 2016-03-14 MED ORDER — IBUPROFEN 800 MG PO TABS: 800.0000 mg | ORAL_TABLET | Freq: Three times a day (TID) | ORAL | 5 refills | Status: DC | PRN

## 2016-03-14 NOTE — Patient Instructions (Signed)
HEAT 3 X A DAY   IBUPROFEN PRESCRIPTION   MILD MUSCLE RELAXER

## 2016-03-14 NOTE — Progress Notes (Signed)
Patient ID: Ian Aguilar, male   DOB: 02/05/1941, 76 y.o.   MRN: [id number]  Follow up visit/  NEW PROBLEM   Chief Complaint  Patient presents with  . Follow-up    Right hip fracture, DOS 09/02/15    Status post long gamma nail for peritrochanteric fracture right hip done in [facility] patient comes in complaining of "a screw sticking out"  76 year old male 6 months status post gamma nail fixation for hip fracture. Was doing well then started noticing lateral leg pain when he was going up and down the steps. He had gotten to the point where he says he didn't even know he had had surgery. He noticed a mass over the side of his right leg near the not over the incision for the lag screw  He does complain of a history of prior back pain but does not report back pain at this time  Says his leg is not numb or tingling and he has no weakness in the right lower extremity  Review of Systems  Constitutional: Negative for chills and fever.  Gastrointestinal: Negative.   Genitourinary: Negative.     Physical Exam  Constitutional: He is oriented to person, place, and time. He appears well-developed and well-nourished. No distress.  Cardiovascular: Intact distal pulses.   Neurological: He is alert and oriented to person, place, and time.  Skin: Skin is warm and dry. He is not diaphoretic. No erythema.    He does have a small sebaceous cyst like structure anterior to his lag screw incision which is nontender both incisions healed well  Has tenderness in his lower back with normal strength in the muscle surrounding his right hip and lower leg No atrophy is noted in the area of the hip is stable as is the knee and ankle Strength in the right lower extremity remains normal No evidence of dislocation subluxation Normal range of motion of the right hip  Left lower extremity normal range of motion stability strength alignment skin is normal neurovascular exam remains intact    Encounter Diagnoses   Name Primary?  . Painful orthopaedic hardware ([place])   . Acute midline low back pain without sciatica Yes    X-raysPlain films of his femoral nail show no abnormalities in the healed fracture with no evidence of backing out of screw or nail  Impression back pain  Recommend use of a heating pad 3 times a day along with the following medications  Meds ordered this encounter  Medications  . ibuprofen (ADVIL,MOTRIN) 800 MG tablet    Sig: Take 1 tablet (800 mg total) by mouth every 8 (eight) hours as needed.    Dispense:  90 tablet    Refill:  5  . methocarbamol (ROBAXIN) 500 MG tablet    Sig: Take 1 tablet (500 mg total) by mouth 3 (three) times daily. Do not take with Xanax    Dispense:  60 tablet    Refill:  2     2:21 PM Arther Abbott, MD 03/14/2016

## 2016-03-23 ENCOUNTER — Telehealth: Payer: Self-pay | Admitting: Orthopedic Surgery

## 2016-03-23 NOTE — Telephone Encounter (Signed)
Patient left message on voicemail saying that [facility] is waiting on authorization for insurance to pay for the prescription for Methocarbamol. The patient went ahead and paid out of pocket this time, but would like for his insurance to pay for it when it is due again.  Please call and advise

## 2016-06-01 ENCOUNTER — Encounter: Payer: Self-pay | Admitting: Internal Medicine

## 2017-01-30 DIAGNOSIS — Z6826 Body mass index (BMI) 26.0-26.9, adult: Secondary | ICD-10-CM | POA: Diagnosis not present

## 2017-01-30 DIAGNOSIS — E663 Overweight: Secondary | ICD-10-CM | POA: Diagnosis not present

## 2017-01-30 DIAGNOSIS — Z1389 Encounter for screening for other disorder: Secondary | ICD-10-CM | POA: Diagnosis not present

## 2017-01-30 DIAGNOSIS — L570 Actinic keratosis: Secondary | ICD-10-CM | POA: Diagnosis not present

## 2017-01-30 DIAGNOSIS — S6291XD Unspecified fracture of right wrist and hand, subsequent encounter for fracture with routine healing: Secondary | ICD-10-CM | POA: Diagnosis not present

## 2017-01-30 DIAGNOSIS — F329 Major depressive disorder, single episode, unspecified: Secondary | ICD-10-CM | POA: Diagnosis not present

## 2017-01-30 DIAGNOSIS — S72001S Fracture of unspecified part of neck of right femur, sequela: Secondary | ICD-10-CM | POA: Diagnosis not present

## 2017-01-30 DIAGNOSIS — Z658 Other specified problems related to psychosocial circumstances: Secondary | ICD-10-CM | POA: Diagnosis not present

## 2017-01-30 DIAGNOSIS — Z23 Encounter for immunization: Secondary | ICD-10-CM | POA: Diagnosis not present

## 2017-01-30 DIAGNOSIS — F419 Anxiety disorder, unspecified: Secondary | ICD-10-CM | POA: Diagnosis not present

## 2017-01-30 DIAGNOSIS — F17200 Nicotine dependence, unspecified, uncomplicated: Secondary | ICD-10-CM | POA: Diagnosis not present

## 2017-01-30 DIAGNOSIS — Z0001 Encounter for general adult medical examination with abnormal findings: Secondary | ICD-10-CM | POA: Diagnosis not present

## 2017-02-01 ENCOUNTER — Ambulatory Visit: Payer: PRIVATE HEALTH INSURANCE

## 2017-02-01 ENCOUNTER — Inpatient Hospital Stay
Admit: 2017-02-01 | Discharge: 2017-02-22 | Disposition: A | Discharge status: Home / Self Care | Payer: Commercial Managed Care - HMO | Source: Home / Self Care | Attending: Emergency Medical Services

## 2017-02-01 DIAGNOSIS — J441 Chronic obstructive pulmonary disease with (acute) exacerbation: Secondary | ICD-10-CM

## 2017-02-01 DIAGNOSIS — M170 Bilateral primary osteoarthritis of knee: Secondary | ICD-10-CM

## 2017-02-01 DIAGNOSIS — I739 Peripheral vascular disease, unspecified: Secondary | ICD-10-CM

## 2017-02-01 DIAGNOSIS — I10 Essential (primary) hypertension: Secondary | ICD-10-CM

## 2017-02-01 DIAGNOSIS — Z79891 Long term (current) use of opiate analgesic: Secondary | ICD-10-CM

## 2017-02-01 DIAGNOSIS — K219 Gastro-esophageal reflux disease without esophagitis: Secondary | ICD-10-CM

## 2017-02-01 DIAGNOSIS — E7800 Pure hypercholesterolemia, unspecified: Secondary | ICD-10-CM

## 2017-02-01 DIAGNOSIS — M1712 Unilateral primary osteoarthritis, left knee: Secondary | ICD-10-CM

## 2017-02-01 DIAGNOSIS — E1142 Type 2 diabetes mellitus with diabetic polyneuropathy: Secondary | ICD-10-CM

## 2017-02-01 LAB — Glucose, Whole Blood: GLUCOSE, WHOLE BLOOD: 210 mg/dL — ABNORMAL HIGH (ref 65–99)

## 2017-02-01 LAB — CREATININE: CREATININE: 0.86 mg/dL (ref 0.60–1.30)

## 2017-02-01 LAB — Extra Light Blue Top

## 2017-02-01 LAB — Troponin I: TROPONIN I: <0.04 ng/mL (ref <0.1)

## 2017-02-01 LAB — CBC: NUCLEATED RBC%, AUTOMATED: 0 (ref 26.4–33.4)

## 2017-02-01 LAB — Electrolyte Panel: ANION GAP: 15 mmol/L (ref 8–19)

## 2017-02-01 LAB — Glucose,POC: GLUCOSE,POC: 125 mg/dL — ABNORMAL HIGH (ref 65–99)

## 2017-02-01 LAB — B-Type Natriuretic Peptide: BNP: 225 pg/mL — ABNORMAL HIGH (ref <100)

## 2017-02-01 LAB — Extra Red Top (Plastic)

## 2017-02-01 LAB — Urea Nitrogen: UREA NITROGEN: 22 mg/dL (ref 7–22)

## 2017-02-01 MED ADMIN — IPRATROPIUM BROMIDE 0.02 % IN SOLN: 500 ug | RESPIRATORY_TRACT | @ 18:00:00 | Stop: 2017-02-01 | NDC 00487980101

## 2017-02-01 MED ADMIN — LEVOFLOXACIN 750 MG PO TABS: 750 mg | ORAL | Stop: 2017-02-07 | NDC 68084048301

## 2017-02-01 MED ADMIN — CLOPIDOGREL BISULFATE 75 MG PO TABS: 75 mg | ORAL | Stop: 2017-02-13 | NDC 00904629461

## 2017-02-01 MED ADMIN — CEFTRIAXONE 1 GM/50 ML RTU: 1 g | INTRAVENOUS | @ 22:00:00 | Stop: 2017-02-01

## 2017-02-01 MED ADMIN — BUSPIRONE HCL 5 MG PO TABS: 15 mg | ORAL | Stop: 2017-02-02 | NDC 16729020001

## 2017-02-01 MED ADMIN — GABAPENTIN 400 MG PO CAPS: 800 mg | ORAL | Stop: 2017-02-22 | NDC 68084077401

## 2017-02-01 MED ADMIN — ALBUTEROL SULFATE (5 MG/ML) 0.5% IN NEBU: 15 mg | RESPIRATORY_TRACT | @ 18:00:00 | Stop: 2017-02-01 | NDC 50383074120

## 2017-02-01 MED ADMIN — APIXABAN 5 MG PO TABS: 5 mg | ORAL | Stop: 2017-02-08 | NDC 00003089431

## 2017-02-01 MED ADMIN — AMIODARONE HCL 200 MG PO TABS: 200 mg | ORAL | Stop: 2017-02-03 | NDC 00904655661

## 2017-02-01 MED ADMIN — AZITHROMYCIN 250 MG PO TABS: 500 mg | ORAL | @ 21:00:00 | Stop: 2017-02-01 | NDC 50268009815

## 2017-02-01 MED ADMIN — CARVEDILOL 25 MG PO TABS: 25 mg | ORAL | Stop: 2017-02-09 | NDC 00904630361

## 2017-02-01 MED ADMIN — PREDNISONE 20 MG PO TABS: 60 mg | ORAL | @ 21:00:00 | Stop: 2017-02-01 | NDC 00054001820

## 2017-02-01 MED ADMIN — DULOXETINE HCL 60 MG PO CPEP: 60 mg | ORAL | Stop: 2017-02-22 | NDC 68084067521

## 2017-02-01 NOTE — Consults
Discussed during IDR, new admit, pt with established home oxygen, pending work up.    Leslee Home CM, RN BSN  Medicine Team D  P: [phone]

## 2017-02-01 NOTE — H&P
Internal Medicine History and Physical      Patient: Ryan Shepard  MRN: [id number]  Date of Service: 02/01/2017  Primary Care Provider: Wilburn Mylar, MD  Attending Physician: Vertis Kelch., MD    Chief Complaint     Chief Complaint   Patient presents with   ??? Shortness of Breath     dx with pneumonia at OSH on antibiotics with clear withe phelgm Denies fever.        History of Present Illness   Ryan Shepard is a 76 y.o. male with COPD, hx of tobacco use disorder, PAD s/p femoral graft, HTN, DM2, HLD, A.fib, GERD, depression, anxiety, chronic pain, who presents with intermittent SOB for the past week. Patient states he noticed ''something felt funny'' and became concerned so he called the paramedics. He has been on home supplemental oxygen for the past 3 years (3L NC) and noticed his SpO2 was 80% yesterday. He states he has been sleeping sitting up for several years and endorses difficulty laying flat. Patient and wife also report increased swelling in his legs for the past 2 weeks in which he states diuretics as needed. He was recently admitted to an outside hospital in [place], [place] for what he presumes was pneumonia. Per patient, he was admitted to the ICU for a SpO2 of 67% but did not require intubation. Patient is a poor historian and unsure of many of the medications he is taking. Wife was consulted over the phone for collateral. Pharmacy was consulted for medication rec. Of note patient's 19 yo grandson recently had a URI.     Patient denies fever, chills, night sweats, NVCD, abdominal pain, or dysuria/hematuria.     Emergency Department Course     ED Triage Vitals [02/01/17 0842]   Temp Temp Source BP Heart Rate Resp SpO2 O2 Device Pain Score Weight   36.6 ???C (97.8 ???F) Oral 112/59 77 22 (!) 80 % None (Room air) Zero (!) 103.4 kg (228 lb)         Past Medical History     Past Medical History:   Diagnosis Date   ??? Cancer (HCC/RAF)    ??? Diabetes (HCC/RAF)    ??? GERD (gastroesophageal reflux disease) ??? Hypercholesteremia    ??? Hypertension    ??? 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 Prostate disease    ??? Scoliosis     adolesent   ??? Vascular disease        Past Surgical History     Past Surgical History:   Procedure Laterality Date   ??? BACK SURGERY     ??? BILATERAL FEMORAL ARTERY EXPLORATION  2009   ??? BLADDER SURGERY  1999   ??? PROSTATE SURGERY  2000        Allergies   No Known Allergies    Home Medications     Medications that the patient states to be currently taking   Medication Sig   ??? albuterol 90 mcg/act inhaler Inhale 2 puffs every four (4) hours as needed.   ??? allopurinol 100 mg tablet Take 100 mg by mouth daily.   ??? ALPRAZolam 2 mg tablet Take 2 mg by mouth three (3) times daily.   ??? amiodarone 200 mg tablet Take 200 mg by mouth daily.   ??? amoxicillin-clavulanate 875-125 mg tablet Take 1 tablet by mouth two (2) times daily For 10 days.   ??? apixaban 5 mg tablet Take by mouth  two (2) times daily.   ??? busPIRone 15 mg tablet Take 15 mg by mouth two (2) times daily .   ??? carvedilol (COREG) 25 mg tablet Take 25 mg by mouth two (2) times daily   ??? clopidogrel (PLAVIX) 75 mg tablet Take 75 mg by mouth daily   ??? digoxin 0.25 mg tablet Take 0.25 mcg by mouth daily.   ??? duloxetine 60 mg DR capsule Take 1 capsule (60 mg total) by mouth daily.   ??? hydrocodone-acetaminophen (NORCO) 10-325 mg tablet Take 1 tablet by mouth every four (4) to six (6) hours .   ??? linagliptin-metFORMIN (JENTADUETO) 2.06-998 MG TABS Take 1 tablet by mouth two (2) times daily .   ??? LORazepam 2 mg tablet Take 2 mg by mouth every four (4) hours as needed.   ??? losartan 100 mg tablet Take 100 mg by mouth daily.   ??? mometasone-formoterol (DULERA) 200-5 mcg/dose inhalation Inhale 2 puffs two (2) times daily.   ??? pantoprazole 40 mg DR tablet Take 40 mg by mouth at bedtime.   ??? pravastatin (PRAVACHOL) 40 mg tablet Take 40 mg by mouth at bedtime   ??? torsemide (DEMADEX) 20 mg tablet Take 20 mg by mouth daily . ??? [DISCONTINUED] ibuprofen 800 mg tablet TK 1 T PO TID PC   ??? [DISCONTINUED] metoprolol tartrate 50 mg tablet Take 50 mg by mouth two (2) times daily.   ??? [DISCONTINUED] torsemide (DEMADEX) 100 mg tablet    ??? [DISCONTINUED] TRUETEST test strip         Social History     Social History     Social History   ??? Marital status: Married     Spouse name: N/A   ??? Number of children: N/A   ??? Years of education: N/A     Social History Main Topics   ??? Smoking status: Never Smoker   ??? Smokeless tobacco: Never Used   ??? Alcohol use 0.0 oz/week   ??? Drug use: No   ??? Sexual activity: Not Asked     Other Topics Concern   ??? None     Social History Narrative   ??? None       Family History     Family History   Problem Relation Age of Onset   ??? Cancer Mother    ??? Cancer Father    ??? Malignant hyperthermia Neg Hx        Physical Exam   Temp:  [36.5 ???C (97.7 ???F)-36.6 ???C (97.8 ???F)] 36.5 ???C (97.7 ???F)  Heart Rate:  [74-83] 83  Resp:  [14-22] 22  BP: (112-170)/(51-69) 170/69  NBP Mean:  [75-94] 94  SpO2:  [80 %-95 %] 90 %   UOP: Urine:  [600 mL] 600 mL I/Os:     Intake/Output Summary (Last 24 hours) at 02/01/17 1942  Last data filed at 02/01/17 1937   Gross per 24 hour   Intake              500 ml   Output              600 ml   Net             -100 ml     Gen: 76 AA well-developed AA  male in no acute distress  Eyes: PERRL, EOMI  ENT: OP clear, MMM, NC in place, unable to appreciate JVD  CV: RRR, normal S1S2, no m/r/g/thrills  Pulm:  Diffuse crackles bilaterally, pursed breathing, mild  retractions  Abd: NABS, soft, flat, NT, no masses or organomegaly  Skin: Warm and dry, no rashes  Ext: 3+ pitting edema bilaterally, normal bulk/tone  Neuro: A&Ox3, CNII-XII intact, motor 5/5 throughout, sensation grossly intact  Psych: linear thought process, poor insight  Bedside U/S revealed 2.27 cm IVC, non-collapsable, RIJ around 6-7 cm    Laboratory Data   CBC:  Recent Labs      02/01/17   0925   WBC  10.26*   HGB  15.5   HCT  46.3   MCV  88.7   PLT  204 BMP:  Recent Labs      02/01/17   0925   NA  140   K  4.0   CL  100   CO2  25   BUN  22   CREAT  0.86   GLUCOSE  210*     VBG   PH 7.40, pCO2 51, pO2 24    GLC 146    Microbiology   Blood cx 02/01/17: pending  MRSA nares 02/01/17: pending  Sputum 02/01/17: pending     Imaging   XR CHEST AP 1V PORTABLE  02/01/2017 9:10 AM  Increasing diffuse confluent airspace opacities within the left suprahilar region and perihilar region with interstitial prominence throughout the left lung and to lesser extent right lung concerning for multifocal aspiration/pneumonia.  Aortic calcification tortuosity with cardiomegaly.  No large effusions or significant pneumothorax.  Osteopenia and multilevel spondylotic maturation changes.    Assessment/Plan   Ryan Shepard is a 76 y.o. male with COPD, hx of tobacco use disorder, PAD s/p femoral graft, HTN, DM2, HLD, A.fib, GERD, depression, anxiety, chronic pain, who presents with intermittent SOB and LE edema for the past week.    # acute on chronic respiratory failure   DDx: COPD exacerbation, community vs hospital acquired pneumonia, pulmonary congestion 2/2 CHF. Patient has an extensive tobacco use history with dx COPD and was recently admitted to OSH in [place] for PNA (undertreated augmentin not great for atypicals)  - CXR 02/01/17 revealed increasing diffuse confluent airspace opacities within the left suprahilar region and perihilar region with interstitial prominence throughout the left lung and to lesser extent right lung concerning for multifocal aspiration/pneumonia.  Aortic calcification tortuosity with cardiomegaly.  - PE: crackles bilaterally, 3+ LE edema, Bedside U/S revealed IVC of 2.7 cm, non-collapsable and RIJ ~ 6-7 cm, non-collapsable   - follow TTE to evaluate for undiagnosed CHF  - strict ins/outs   - daily weights  - give 1 dose of IV Lasix 20 mg   - goal net > -500 cc   - follow  procalcitonin   - daily CBC  - start PO levofloxacin 750 mg daily   - follow sputum cultures - follow MRSA nares  - follow urine legionella   - continue NC; SpO2 > 88 on NC   - scheduled duoneb q6h   - start Prednisone 40 mg PO daily x 5 days   - patient on home torsemide 20 mg , PO Lasix 40 mg (unclear if taking PRN or daily)     #hx of Afib. Chronic/stable  - continuous cardiac monitoring   - continue home amiodarone 200 mg daily  - continue home carvedilol 25 mg daily     #PAD: chronic/stable  Per pt hx of PAD s/p femoral graft  - continue home apixaban 5 mg BID  - continue clopidogrel 75 mg daily     #HTN: chronic/stable  - on home  losartan-HCTZ (non-formulary)  - restart losartan 100 mg daily in AM    #HLD: chronic/stable  - continue home pravastatin 40 mg nightly     #GERD: chronic/stable  - start pantoprazole 40 mg daily     #DM2: chronic/stable  - holding home metformin  - accuchecks   - start sliding scale  - continue home gabapentin 800 mg  TID     #depression   #anxiety  - continue buspirone 15 mg BID  - continue home duloxetine DR 60   - continue home lorazepam 2 mg PRN BID    #chornic pain  Per patient ongoing lower back and lower extremity pain for several years  - continue home Norco 5-325 mg q6h PRN   - obtain collateral from PCP/CURES    Inpatient Checklist:  FEN/GI:   Diet:       Diet carbohydrate controlled 2gm NA  Analgesia: Norco  Sedation: N/A  DVT Prophylaxis: on home apixaban and clopidogrel   GI Prophylaxis: on PPI  Glycemic control: insulin aspart algorithm #2  Bowel regimen: NA    #Disposition: inpatient for COPD exacerbation     Code Status: Full Code  Contact:  Primary Emergency Contact: Reggio,Johnnie, Home Phone: [phone]    Patient has been seen and discussed with attending, Dr. Foster Simpson     Author:  Bascom Levels, MD  [phone]  02/01/2017 7:42 PM     I saw and evaluated Ryan Shepard on 02/02/2017 (date of service).  I have reviewed the patient's labs, radiology, and notes. I discussed the case with the resident and agree with the findings and plan of care as documented in the resident's note with the following additions/modifications. PE notable for pursed lip breathing, mild tachypnea & retractions, NC in place, 2+ LE edema, scars in LE from prior vascular bypass procedures. Overall impression is that pt has acute on chronic hypoxemic resp failure 2/2 PNA + COPD exacerbation. He also had new leg swelling without h/o CHF which could be due to COPD exac, venous stasis, or undiagnosed CHF. Difficult as pt is a poor historian. JVP & IVC ultrasound discordant but overall impression is that pt is mildly intravascularly hypervolemic. Will work on treating all potential etiologies. Pt also with polypharmacy and multiple prescribers - will need collateral information from PCP and simplification of regimen.     Current Hospital Problems           POA    Chronic prescription opiate use Not Applicable    COPD (chronic obstructive pulmonary disease) (HCC/RAF) Yes    COPD exacerbation (HCC/RAF) Yes    Diabetes (HCC/RAF) Yes    GERD (gastroesophageal reflux disease) Yes    Hypercholesteremia Yes    Hypertension Yes    Lower back pain Yes    Lower extremity edema Unknown    Memory loss Unknown    OA (osteoarthritis) of knee Yes    PAD (peripheral artery disease) (HCC/RAF) Yes    Pneumonia Unknown    Primary osteoarthritis of knees, bilateral Yes          Signed: Thaddius Manes C. Foster Simpson, MD

## 2017-02-01 NOTE — Progress Notes
Pharmaceutical Services ??? Medication Reconciliation Note    Patient Name: Ryan Shepard  Medical Record Number: [id number]  Admit date: 02/01/2017 10:36 AM    Age: 76 y.o.  Sex: male  Allergies: Patient has no known allergies.  Height:   Most recent documented height   04/16/14 1.854 m (6' 1'')     Actual Weight:   Most recent documented weight   02/01/17 (!) 103.4 kg (228 lb)   04/16/14 (!) 109.8 kg (242 lb)       The patient is currently admitted with the following concerns/issues:   Patient Active Problem List    Diagnosis Date Noted   ??? COPD exacerbation (HCC/RAF) 02/01/2017   ??? COPD (chronic obstructive pulmonary disease) (HCC/RAF) 02/01/2017   ??? Primary osteoarthritis of knees, bilateral 04/30/2014   ??? Left shoulder pain 07/02/2013   ??? Left knee pain 06/04/2013   ??? OA (osteoarthritis) of knee 06/11/2012   ??? 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 Lower back pain 03/17/2012     The Pharmacy Medication Reconciliation Technician spoke with family pt's wife Ryan Shepard to reconcile the home medications for this hospital admission: spoke to pts wife over the phone and she read pts home medlist. She also mentioned she may also be taking additional meds but is unsure. Pt may have some meds at beside but declined to provide....    Prior to Admission medications as of 02/01/17 1547   Medication Sig Notes Last Dose   albuterol 90 mcg/act inhaler 2 puffs, Inhalation, Every 4 hours PRN   02/01/2017   allopurinol 100 mg tablet 100 mg, Oral, Daily   02/01/2017   ALPRAZolam 2 mg tablet 2 mg, Oral, 3 times daily   02/01/2017   amiodarone 200 mg tablet 200 mg, Oral, Daily   02/01/2017 at Unknown time   amoxicillin-clavulanate 875-125 mg tablet 1 tablet, Oral, 2 times daily   02/01/2017 at Unknown time   apixaban 5 mg tablet Oral, 2 times daily   02/01/2017 at Unknown time   busPIRone 15 mg tablet 15 mg, Oral, 2 times daily   01/31/2017 at Unknown time clopidogrel (PLAVIX) 75 mg tablet 75 mg, Daily   01/31/2017 at Unknown time   digoxin 0.25 mg tablet 0.25 mcg, Oral, Daily   02/01/2017   duloxetine 60 mg DR capsule 60 mg, Oral, Daily   01/31/2017 at Unknown time   hydrocodone-acetaminophen (NORCO) 10-325 mg tablet 1 tablet, Oral, Every 4 to 6 hours   01/31/2017 at Unknown time   linagliptin-metFORMIN (JENTADUETO) 2.06-998 MG TABS Oral   01/31/2017 at Unknown time   metoprolol tartrate 50 mg tablet 50 mg, Oral, 2 times daily   02/01/2017 at Unknown time   mometasone-formoterol (DULERA) 200-5 mcg/dose inhalation 2 puffs, Inhalation, 2 times daily   02/01/2017   pantoprazole 40 mg DR tablet 40 mg, Oral, Every night at bedtime   01/31/2017   pravastatin (PRAVACHOL) 40 mg tablet 40 mg, Every night at bedtime   01/31/2017 at Unknown time   torsemide (DEMADEX) 20 mg tablet 20 mg, Oral, Daily   01/31/2017 at Unknown time   TRUETEST test strip No dose, route, or frequency recorded.   Past Week at Unknown time   carisoprodol (SOMA) 350 mg tablet 350 mg, Oral, 2 times daily at 10 AM & 10 PM   Unknown at Unknown time   carvedilol (COREG) 25 mg tablet 25 mg, 2 times daily   Unknown at Unknown time   cilostazol (  PLETAL) 100 mg tablet 100 mg, 2 times daily   Unknown at Unknown time   furosemide (LASIX) 40 mg tablet No dose, route, or frequency recorded.   Unknown   gabapentin (NEURONTIN) 800 mg tablet 800 mg, 3 times daily   Unknown at Unknown time   lansoprazole (PREVACID) 30 mg capsule No dose, route, or frequency recorded.      LORazepam 2 mg tablet 2 mg, Oral, Every 4 hours PRN   Unknown   losartan-hydrochlorothiazide (HYZAAR) 100-25 mg tablet 1 tablet, Daily   Unknown at Unknown time   metformin (GLUCOPHAGE) 1000 mg tablet No dose, route, or frequency recorded.   Unknown at Unknown time   naproxen (NAPROSYN) 500 mg tablet No dose, route, or frequency recorded.   Unknown at Unknown time   omeprazole (PRILOSEC) 20 mg capsule 40 mg, Daily   Unknown at Unknown time sitagliptan-metformin (JANUMET) 50-1000 mg tablet 1 tablet, 2 times daily with meals      triamcinolone (KENALOG) 0.025% cream No dose, route, or frequency recorded.   Unknown   vitamin B-6 (PYRIDOXINE) 100 mg tablet 100 mg, Daily   Unknown         The patient's medications have been reviewed and reconciled. The reconciliation is complete.  There are no issues requiring follow-up at this time.Edward Qualia, 02/01/2017, 3:48 PM

## 2017-02-01 NOTE — ED Provider Notes
Ryan Shepard [facility]  Emergency Department Service Report    Triage     Ryan Shepard, a 76 y.o. male, presents with Shortness of Breath (dx with pneumonia at OSH on antibiotics with clear withe phelgm Denies fever. )    Arrived on 02/01/2017 at 8:38 AM   Arrived by Wheelchair [4]    ED Triage Vitals [02/01/17 0842]   Temp Temp Source BP Heart Rate Resp SpO2 O2 Device Pain Score Weight   36.6 ???C (97.8 ???F) Oral 112/59 77 22 (!) 80 % None (Room air) Zero (!) 103.4 kg (228 lb)       No Known Allergies     Initial Physician Contact       Comprehensive Exam Initiated  Contact Date: 02/01/17  Contact Time: 1610    History   The history is provided by the patient.     Ryan Shepard is a 76 y.o. male with history of diabetes mellitus, hypertension, hypercholesteremia, COPD, who presents with progressive shortness of breath for the past two weeks. Patient states that traveled to [facility] after thanksgiving (~11/23), when he started having worsening shortness of breath. Patient was seen at an outside hospital in [facility], when he was admitted for 4 days and discharged a week ago. Patient states that he was diagnosed with pneumonia and started on Augmentin treatment that he has been on for 10 days. Has not been prescribed home nebulizer. Patient on 3 lt O2 at baseline at home for COPD. States that his SpO2 is usually in the 80's at home. Denies fevers, chills, or any other symptoms. Denies history of blood clots. No other symptoms or complaints.     Past Medical History:   Diagnosis Date   ??? Cancer (HCC/RAF)    ??? Diabetes (HCC/RAF)    ??? GERD (gastroesophageal reflux disease)    ??? Hypercholesteremia    ??? Hypertension    ??? 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 Prostate disease    ??? Scoliosis     adolesent   ??? Vascular disease         Past Surgical History:   Procedure Laterality Date   ??? BACK SURGERY     ??? BILATERAL FEMORAL ARTERY EXPLORATION  2009   ??? BLADDER SURGERY  1999 ??? PROSTATE SURGERY  2000        Past Family History   family history includes Cancer in his father and mother.     Past Social History   he reports that he has never smoked. He has never used smokeless tobacco. He reports that he drinks alcohol. He reports that he does not use drugs. His sexual activity history is not on file.     Review of Systems   Constitutional: Negative for chills and fever.   Respiratory: Positive for shortness of breath.    Cardiovascular: Negative for chest pain.   All other systems reviewed and are negative.    Physical Exam   Physical Exam   Constitutional: He is oriented to person, place, and time. He appears well-developed and well-nourished. No distress.   Speaking in full sentences    HENT:   Head: Normocephalic and atraumatic.   Mouth/Throat: Oropharynx is clear and moist.   Eyes: Conjunctivae are normal.   Neck: Normal range of motion.   Cardiovascular: Normal rate, regular rhythm and normal heart sounds.    Pulmonary/Chest: Effort normal. No respiratory distress.   (+)  rhoncourous breath sounds throughout lung fields  (+) wheezes throughout   Abdominal: Soft. There is no tenderness.   Musculoskeletal: Normal range of motion.   2+pitting edema bilaterally   Neurological: He is alert and oriented to person, place, and time.   Skin: Skin is warm and dry.   Psychiatric: He has a normal mood and affect.   Nursing note and vitals reviewed.     Medical Decision Making   [name] is a 76 y.o. male with history of diabetes mellitus, hypertension, hypercholesteremia, COPD, who presents with progressive shortness of breath for the past two weeks. Concern for COPD exacerbation vs pneumonia vs CHF. Will obtain cardiac workup, give nebulizer treatment, and closely monitor.     Plan: basic labs, troponin, BNP, chest xray, nebulizer treatment, monitor, admit.     Chart Review   Previous medical records requested.  Pertinent items reviewed:     ED Course      Laboratory Results Labs Reviewed   CBC - Abnormal; Notable for the following:        Result Value    White Blood Cell Count 10.26 (*)     All other components within normal limits   GLUCOSE - Abnormal; Notable for the following:     Glucose 210 (*)     All other components within normal limits   ELECTROLYTE PANEL - Normal   UREA NITROGEN - Normal   CREATININE,WHOLE BLOOD - Normal   TROPONIN I   BNP   RAINBOW DRAW TO LABORATORY    Narrative:     The following orders were created for panel order [facility] Draw- Defaults.  Procedure                               Abnormality         Status                     ---------                               -----------         ------                     Extra Red Top (Plastic)[[id number]]                          In process                 Extra Light Blue [id number]                             In process                   Please view results for these tests on the individual orders.   EXTRA RED TOP (PLASTIC)   EXTRA LIGHT BLUE TOP       Imaging Results     XR chest ap portable (1 view)   Final Result by Liliane Shi., MD (12/06 1009)   IMPRESSION:   Increasing diffuse confluent airspace opacities within the left suprahilar region and perihilar region with interstitial prominence throughout the left lung and to lesser extent right lung concerning for multifocal aspiration/pneumonia.   Aortic calcification tortuosity with cardiomegaly.   No  large effusions or significant pneumothorax.   Osteopenia and multilevel spondylotic maturation changes.      Signed by: Aura Dials   02/01/2017 10:09 AM          Consults     Progress Notes / Reassessments          Time               Notes    9:17 AM:    CXR cw PNA. Abx differed to inpt team as pt already on abx x ~01 days. Bed request to medicine.      10:10 AM: Patient signed out to medicine team.     ED Procedure Notes   None  Any ED procedures performed are documented on separate ED procedure notes.    Clinical Impression 1. COPD exacerbation (HCC/RAF)        Disposition and Follow-up   Disposition: Admit    Orthoptist   I, [employer], have acted as a Stage manager for [employer] on behalf of Dr. Trudi Ida on 02/01/2017 at 8:55 AM.    Physician Signature     I have reviewed this note as recorded by SB, who acted as medical scribe, and I attest that it is an accurate representation of my H&P and other events of the ED visit except as otherwise noted.           Rosemarie Ax., MD  Resident  02/01/17 1558    Attestation     ATTENDING NOTE    I have performed a history and physical exam of this patient and discussed the management with the resident. I have reviewed the resident note and agree with the documented findings and plan of care.               Rory Percy., MD  02/02/17 [phone]

## 2017-02-01 NOTE — ED Notes
X-ray at bedside

## 2017-02-01 NOTE — ED Notes
EDIE?NOTIFICATION?02/01/2017 08:38?KEANAN, MELANDER M?MRN: [id number]    This patient has registered at the [facility] Emergency Department   For more information visit: [email or web address]   Security Events  No recent Security Events currently on file      ED Care Guidelines  There are currently no ED Care Guidelines in [place] for this patient. Please check your facility's medical records system.  [facility]'s patient encounter information:   QGB:[id number]  Account [id number]  Billing Account [id number]      Recent Emergency Department Visit Summary  Admit Date [place] Type Major Type Diagnoses or Chief Complaint   Feb 01, 2017 [place] [facility]  Emergency     Jan 13, 2017 [place]  Emergency         Recent Inpatient Visit Summary  No recorded inpatient visits.     E.D. Visit Count (12 mo.)  Facility Visits   [facility] Heft. [facility] 1   [address] 1   Total 2   Note: Visits indicate total known visits.      The above information is provided for the sole purpose of patient treatment. Use of this information beyond the terms of Data Sharing Memorandum of Understanding and License Agreement is prohibited. In certain cases not all visits may be represented. Consult the aforementioned facilities for additional information.   ? 2018 [place], [place] - [email or web address]

## 2017-02-02 LAB — Magnesium: MAGNESIUM: 1.6 meq/L (ref 1.4–1.9)

## 2017-02-02 LAB — Blood Gases,venous
PH,VENOUS: 7.4 mmHg
PO2,VENOUS: 41 mmHg

## 2017-02-02 LAB — Basic Metabolic Panel
ANION GAP: 11 mg/dL (ref 8–19)
CHLORIDE: 100 mmol/L (ref 96–106)

## 2017-02-02 LAB — Glucose,POC
GLUCOSE,POC: 146 mg/dL — ABNORMAL HIGH (ref 65–99)
GLUCOSE,POC: 188 mg/dL — ABNORMAL HIGH (ref 65–99)
GLUCOSE,POC: 139 mg/dL — ABNORMAL HIGH (ref 65–99)
GLUCOSE,POC: 240 mg/dL — ABNORMAL HIGH (ref 65–99)

## 2017-02-02 LAB — Procalcitonin: PROCALCITONIN: <0.1 ug/L (ref <0.10)

## 2017-02-02 LAB — Bacterial Culture Respiratory

## 2017-02-02 LAB — Differential Automated: ABSOLUTE IMMATURE GRAN COUNT: 0.05 10*3/uL — ABNORMAL HIGH (ref 0.00–0.04)

## 2017-02-02 LAB — Digoxin: DIGOXIN: 1 ng/mL (ref 0.5–2.2)

## 2017-02-02 LAB — Sputum Screen: SPUTUM SCREEN: <10

## 2017-02-02 LAB — CBC: MEAN PLATELET VOLUME: 9.7 fL (ref 9.3–13.0)

## 2017-02-02 MED ADMIN — AMIODARONE HCL 200 MG PO TABS: 200 mg | ORAL | @ 16:00:00 | Stop: 2017-02-03 | NDC 00904655661

## 2017-02-02 MED ADMIN — GABAPENTIN 400 MG PO CAPS: 800 mg | ORAL | @ 21:00:00 | Stop: 2017-02-22 | NDC 68084077401

## 2017-02-02 MED ADMIN — BUSPIRONE HCL 5 MG PO TABS: 15 mg | ORAL | @ 16:00:00 | Stop: 2017-02-21 | NDC 16729020001

## 2017-02-02 MED ADMIN — IPRATROPIUM BROMIDE 0.02 % IN SOLN: 500 ug | RESPIRATORY_TRACT | @ 01:00:00 | Stop: 2017-02-03 | NDC 00487980101

## 2017-02-02 MED ADMIN — GABAPENTIN 400 MG PO CAPS: 800 mg | ORAL | @ 06:00:00 | Stop: 2017-02-22 | NDC 68084077401

## 2017-02-02 MED ADMIN — HYDROCHLOROTHIAZIDE 25 MG PO TABS: 25 mg | ORAL | @ 20:00:00 | Stop: 2017-02-03 | NDC 63739012810

## 2017-02-02 MED ADMIN — NON FORMULARY: ORAL | @ 03:00:00 | Stop: 2017-02-03

## 2017-02-02 MED ADMIN — LOSARTAN POTASSIUM 50 MG PO TABS: 100 mg | ORAL | @ 20:00:00 | Stop: 2017-02-09 | NDC 68084034701

## 2017-02-02 MED ADMIN — APIXABAN 5 MG PO TABS: 5 mg | ORAL | @ 16:00:00 | Stop: 2017-02-08 | NDC 00003089431

## 2017-02-02 MED ADMIN — ALBUTEROL SULFATE (2.5 MG/3ML) 0.083% IN NEBU: 2.5 mg | RESPIRATORY_TRACT | @ 14:00:00 | Stop: 2017-02-03 | NDC 00487950101

## 2017-02-02 MED ADMIN — CLOPIDOGREL BISULFATE 75 MG PO TABS: 75 mg | ORAL | @ 16:00:00 | Stop: 2017-02-13 | NDC 00904629461

## 2017-02-02 MED ADMIN — HYDROCODONE-ACETAMINOPHEN 5-325 MG PO TABS: 2 | ORAL | @ 18:00:00 | Stop: 2017-02-22 | NDC 00406012362

## 2017-02-02 MED ADMIN — GABAPENTIN 400 MG PO CAPS: 800 mg | ORAL | @ 16:00:00 | Stop: 2017-02-22 | NDC 68084077401

## 2017-02-02 MED ADMIN — NON FORMULARY: ORAL | @ 16:00:00 | Stop: 2017-02-03

## 2017-02-02 MED ADMIN — CARVEDILOL 25 MG PO TABS: 25 mg | ORAL | @ 07:00:00 | Stop: 2017-02-09 | NDC 00904630361

## 2017-02-02 MED ADMIN — LORAZEPAM 1 MG PO TABS: 2 mg | ORAL | @ 06:00:00 | Stop: 2017-02-10 | NDC 69315090505

## 2017-02-02 MED ADMIN — FUROSEMIDE 40 MG PO TABS: 40 mg | ORAL | @ 20:00:00 | Stop: 2017-02-09 | NDC 51079007320

## 2017-02-02 MED ADMIN — BUSPIRONE HCL 5 MG PO TABS: 15 mg | ORAL | @ 05:00:00 | Stop: 2017-02-02

## 2017-02-02 MED ADMIN — LEVOFLOXACIN 750 MG PO TABS: 750 mg | ORAL | @ 16:00:00 | Stop: 2017-02-07 | NDC 68084048301

## 2017-02-02 MED ADMIN — ALBUTEROL SULFATE (2.5 MG/3ML) 0.083% IN NEBU: 2.5 mg | RESPIRATORY_TRACT | @ 01:00:00 | Stop: 2017-02-03

## 2017-02-02 MED ADMIN — ALBUTEROL SULFATE (2.5 MG/3ML) 0.083% IN NEBU: 2.5 mg | RESPIRATORY_TRACT | @ 20:00:00 | Stop: 2017-02-03 | NDC 00487950101

## 2017-02-02 MED ADMIN — IPRATROPIUM BROMIDE 0.02 % IN SOLN: 500 ug | RESPIRATORY_TRACT | @ 01:00:00 | Stop: 2017-02-03

## 2017-02-02 MED ADMIN — ALBUTEROL SULFATE (2.5 MG/3ML) 0.083% IN NEBU: 2.5 mg | RESPIRATORY_TRACT | @ 01:00:00 | Stop: 2017-02-03 | NDC 00487950101

## 2017-02-02 MED ADMIN — CARVEDILOL 25 MG PO TABS: 25 mg | ORAL | @ 16:00:00 | Stop: 2017-02-09 | NDC 00904630361

## 2017-02-02 MED ADMIN — APIXABAN 5 MG PO TABS: 5 mg | ORAL | @ 07:00:00 | Stop: 2017-02-08 | NDC 00003089431

## 2017-02-02 MED ADMIN — DULOXETINE HCL 60 MG PO CPEP: 60 mg | ORAL | @ 16:00:00 | Stop: 2017-02-22 | NDC 68084067521

## 2017-02-02 MED ADMIN — INSULIN ASPART 100 UNIT/ML SC SOLN: 10 mL | SUBCUTANEOUS | @ 06:00:00 | Stop: 2017-02-04 | NDC 00169750111

## 2017-02-02 MED ADMIN — IPRATROPIUM BROMIDE 0.02 % IN SOLN: 500 ug | RESPIRATORY_TRACT | @ 09:00:00 | Stop: 2017-02-03 | NDC 00487980101

## 2017-02-02 MED ADMIN — INSULIN ASPART 100 UNIT/ML SC SOLN: 10 mL | SUBCUTANEOUS | @ 03:00:00 | Stop: 2017-02-04 | NDC 00169750111

## 2017-02-02 MED ADMIN — IPRATROPIUM BROMIDE 0.02 % IN SOLN: 500 ug | RESPIRATORY_TRACT | @ 20:00:00 | Stop: 2017-02-03 | NDC 00487980101

## 2017-02-02 MED ADMIN — PREDNISONE 20 MG PO TABS: 40 mg | ORAL | @ 16:00:00 | Stop: 2017-02-06 | NDC 00054001820

## 2017-02-02 MED ADMIN — PANTOPRAZOLE SODIUM 40 MG PO TBEC: 40 mg | ORAL | @ 16:00:00 | Stop: 2017-02-22 | NDC 68084081309

## 2017-02-02 MED ADMIN — PYRIDOXINE HCL 50 MG PO TABS: 100 mg | ORAL | @ 16:00:00 | Stop: 2017-02-22 | NDC 00536440801

## 2017-02-02 MED ADMIN — INSULIN ASPART 100 UNIT/ML SC SOLN: 10 mL | SUBCUTANEOUS | @ 22:00:00 | Stop: 2017-02-04 | NDC 00169750111

## 2017-02-02 MED ADMIN — PRAVASTATIN SODIUM 40 MG PO TABS: 40 mg | ORAL | @ 06:00:00 | Stop: 2017-02-22 | NDC 51079078220

## 2017-02-02 MED ADMIN — HYDROCODONE-ACETAMINOPHEN 5-325 MG PO TABS: 2 | ORAL | @ 04:00:00 | Stop: 2017-02-02 | NDC 00406012362

## 2017-02-02 MED ADMIN — ALBUTEROL SULFATE (2.5 MG/3ML) 0.083% IN NEBU: 2.5 mg | RESPIRATORY_TRACT | @ 09:00:00 | Stop: 2017-02-03 | NDC 00487950101

## 2017-02-02 MED ADMIN — PYRIDOXINE HCL 50 MG PO TABS: 100 mg | ORAL | @ 03:00:00 | Stop: 2017-02-22 | NDC 00536440801

## 2017-02-02 MED ADMIN — IPRATROPIUM BROMIDE 0.02 % IN SOLN: 500 ug | RESPIRATORY_TRACT | @ 14:00:00 | Stop: 2017-02-03 | NDC 00487980101

## 2017-02-02 NOTE — Other
Patients Clinical Goal:   Clinical Goal(s) for the Shift: Stable VS, SPO2 >88%, No changes in neuro status, Blood sugar WNL, Maintain safety, Pain management  Identify possible barriers to advancing the care plan: None  Stability of the patient: Moderately Unstable - medium risk of patient condition declining or worsening    End of Shift Summary: Pt able to sleep well through the night.      Plan of Care   Monitor respiratory status, SPO2 >88%, Nebs q6hrs, Gentle diuresis, Control blood sugar, Pain management, Maintain safety, PO Antibiotic, Anxiety control    Notable/significant events and interventions  0115-Pt having apneic breathing per RT. BIPAP ordered. VBG drawn and sent.     Additional Notes   Neuro: Alert & oriented X 4, no changes in neuro status  Resp: Crackles auscultated, SPO2 82% on RA, SPO2 >92% on 3LNC.   Cardiac: NSR on cardiac monitor, HR 74-84, B/P 134/52-170/69. Edema +3 in BLE.   GI: Good appetite. Carb control diet, 2 gm Na. Accu-check ac & hs, last blood sugar 188.  GU: Voiding in urinal.   SKin: Intact.   IV: 20 gauge in LAC  Pain: Generalized pain relieved with Norco prn.       Review of Discharge Planning  Plan/goals for discharge: Discharge home once stable with home health  Barriers:None     Handoff Checklist - Completed at every change of shift  Central Line  no  Foley Catheter  no  High-risk drugs independently verified no  Sepsis screen reviewed and completed with 2nd RN?  yes, 2nd RN name: Herbie Saxon  Pressure injury  no  RN/MD Rounding? no     [name]

## 2017-02-02 NOTE — ED Notes
Patient alert and oriented x4, VSS, on 4L NC, did not void today but will attempt after dinner. Managing blood sugar with novolog. Pain med ordered at change of shift; to be given by claudina, rn. Report given.

## 2017-02-02 NOTE — Nursing Note
1:13 AM covering nurse for break relief, notified by RT who was giving a breathing treatment that patient looks to be apneic breathing, RT paged MD for possible order for BIPAP.

## 2017-02-02 NOTE — Nursing Note
1933-Received pt Alert & oriented X 4, VS taken, noted SPO2 82% on RA. Fixed nasal cannula and placed back in nose, currently on 4 LNC, SPO2 increased to 90%. Pt stating having 10/10 generalized pain.    1942-Norco 2 tabs PO q6hrs administered.    2042-Generalized pain decreased to 6/10.    2149-Current BS 188, pt wife arrived by bedside.    2455-SPO2 82% noted, nasal cannula out side of nose, placed back on, SPO2 increasing to 88%. RT by bedside to administer breathing treatment.     0205-Back from break, updated by covering RN. Pt resting SPO2 92%, wife by bedside.

## 2017-02-02 NOTE — Consults
CM attempted to complete CM assessment but pt noted drowsy during interview. CM will continue to follow and assist - per IDR discussion, plan for PT eval, no anticipated d/c over the weekend.     For any weekend CM needs please page weekend CM p [phone].      Leslee Home CM, RN BSN  Medicine Team D  P: [phone]

## 2017-02-02 NOTE — Progress Notes
Internal Medicine Progress Note      PMD: Wilburn Mylar, MD  DATE OF SERVICE: 02/02/2017  HOSPITAL DAY: 1  CHIEF COMPLAINT:   Chief Complaint   Patient presents with   ??? Shortness of Breath     dx with pneumonia at OSH on antibiotics with clear withe phelgm Denies fever.        Last 24 Hour/Overnight Events   - per NF, RT was concerned for ''abdominal breathing'' and had rec'd BIPAP, NF intern went to assess patient and was saturating well on 3L NC, patient and wife at bedside stated not on BIPAP at home   - PRN norco 20 overnight for chronic back + lower extremity pain   - patient continues to report SOB and LE edema  Bilaterally     Subjective/Review of Systems   14-point ROS negative except as outlined above.    Medications     Scheduled:    albuterol 2.5 mg Nebulization Q6H   amiodarone 200 mg Oral Daily   apixaban 5 mg Oral BID   busPIRone 15 mg Oral BID   carvedilol 25 mg Oral BID   clopidogrel 75 mg Oral Daily   digoxin 250 mcg Oral Daily   DULoxetine 60 mg Oral Daily   furosemide 40 mg Oral Daily   gabapentin 800 mg Oral TID   hydroCHLOROthiazide 25 mg Oral Daily   ipratropium 0.5 mg Nebulization Q6H   levoFLOXacin 750 mg Oral Daily   losartan 100 mg Oral Daily   non formulary  Oral Daily   pantoprazole 40 mg Oral Daily   pravastatin 40 mg Oral QHS   predniSONE 40 mg Oral Daily   pyridoxine 100 mg Oral Daily      Continuous:     PRN:  insulin aspart **AND** dextrose, HYDROcodone-acetaminophen, LORazepam      Physical Exam   Temp:  [36.4 ???C (97.5 ???F)-36.7 ???C (98.1 ???F)] 36.4 ???C (97.6 ???F)  Heart Rate:  [66-83] 66  Resp:  [16-22] 19  BP: (134-174)/(52-74) 156/65  NBP Mean:  [75-100] 89  SpO2:  [82 %-95 %] 92 %   UOP: Urine:  [600 mL-900 mL] 900 mL I/Os:     Intake/Output Summary (Last 24 hours) at 02/02/17 1514  Last data filed at 02/02/17 1500   Gross per 24 hour   Intake             1488 ml   Output             1500 ml   Net              -12 ml     Gen: 76 AA well-developed AA  male in no acute distress, sleepy but arousable   Eyes: PERRL, EOMI  HEENT: OP clear, MMM, NC in place, unable to appreciate JVD  CV: RRR, normal S1S2, no m/r/g  Pulm:  Diffuse crackles bilaterally, wheezing to anterior L chest   Abd: NABS, soft, flat, NT, no masses or organomegaly  Skin: Warm and dry, no rashes  Ext: 3+ pitting edema bilaterally   Neuro: A&Ox3, CNII-XII intact, motor 5/5 throughout, sensation grossly intact    Laboratory Data   CBC:  Recent Labs      02/02/17   0637  02/01/17   0925   WBC  8.78  10.26*   NEUTABS  6.77   --    HGB  13.8  15.5   HCT  40.1  46.3   MCV  86.1  88.7   PLT  152  204     BMP:  Recent Labs      02/02/17   0637  02/01/17   0925   NA  140  140   K  4.4  4.0   CL  100  100   CO2  29  25   BUN  19  22   CREAT  0.97  0.86   GLUCOSE  153*  210*   CALCIUM  9.0   --    MG  1.6   --      VBG pH 7.29, pCO2 41, pO2 41, HCO3 24.2   procalcitonin < 0.1    Microbiology   MRSA nares 02/01/17: pending  Sputum screen 02/01/17: <10 Epithelial cells per low power field   Resp bacterial Cx 02/01/17: in process   Legionella urine 02/01/17: in process     Imaging   TTE 02/01/17   1. Technically difficult study due to poor acoustic windows.   2. Small LV size, moderate concentric left ventricular hypertrophy, hyperdynamic systolic function, mild LV diastolic dysfunction (grade I, impaired relaxation).   3. Left ventricular ejection fraction is approximately 70-75%.   4. Normal right ventricular size and normal systolic function.   5. There are no prior echocardiograms available for comparison.    Assessment/Plan   Ryan Shepard is a 76 y.o. male Ryan Shepard is a 76 y.o. male with COPD, hx of tobacco use disorder, PAD s/p femoral graft, HTN,   ???  # acute on chronic respiratory failure   #grade 1 diastolic dysfunction   DDx: COPD exacerbation, community vs hospital acquired pneumonia, less likely pulmonary congestion 2/2 CHF. Patient has an extensive tobacco use history with dx COPD and was recently admitted to OSH in [place] for PNA (undertreated augmentin not great for atypicals)  CXR 02/01/17 revealed increasing diffuse confluent airspace opacities within the left suprahilar region and perihilar region with interstitial prominence throughout the left lung and to lesser extent right lung concerning for multifocal aspiration/pneumonia. Aortic calcification tortuosity with cardiomegaly.  procalcitonin <0.1. Weight downtrending.   PE: crackles bilaterally, 3+ LE edema, Bedside U/S revealed IVC of 2.7 cm, non-collapsable and RIJ ~ 6-7 cm, non-collapsable   - TTE 02/01/17 revealed grade 1 diastolic dysfunction, LVEF 70-75%  - strict ins/outs   - daily weights (103.4 kg on admission)  - start PO Lasix 40 mg daily   - goal net > -500 cc, ok to spot dose PO lasix 20 mg   - daily CBC  - continue PO levofloxacin 750 mg daily   - follow sputum cultures   - follow MRSA nares  - follow urine legionella   - keep SpO2 > 88 (on NC --> okay to use face mask)  - scheduled duoneb q6h   - continue Prednisone 40 mg PO daily x 5 days (12/6-)  ???  #hx of Afib  - continuous cardiac monitoring   - continue home amiodarone 200 mg daily  - digoxin level 1.0 (theraputic for hx of HF) --> start digoxin 250 mcg daily   - continue home carvedilol 25 mg daily  BID   ???  #PAD  PAD s/p femoral-femoral graft (2009)  - continue home apixaban 5 mg BID  - continue clopidogrel 75 mg daily   ???  #HTN  SBP 130s-170s, on home losartan-HCTZ (non-formulary), Cr stable  - start losartan 100 mg daily   - start HCTZ 25 mg daily   ???  #  HLD  - continue home pravastatin 40 mg nightly   ???  #GERD  - continue pantoprazole 40 mg daily   ???  #DM2  - holding home metformin  - accuchecks (GLC 125-240), patient on prednisone  - insulin sliding scale algorithm #2 --> re-evaluate in AM if need to increase to #3  - continue home gabapentin 800 mg  TID   ???  #depression   #anxiety  - continue buspirone 15 mg BID  - continue home duloxetine DR 60   - continue home lorazepam 2 mg PRN BID  ???  #chornic pain Per patient ongoing lower back and lower extremity pain for several years  - continue home Norco 10-325 mg q6h PRN   - obtain collateral from PCP/CURES  ???  Inpatient Checklist:  FEN/GI:   Diet:       Diet carbohydrate controlled 2gm NA  Analgesia: Norco  Sedation: N/A  DVT Prophylaxis: on home apixaban and clopidogrel   GI Prophylaxis: on PPI  Glycemic control: insulin aspart algorithm #2  Bowel regimen: NA  ???  #Disposition: inpatient for COPD exacerbation   ???  Code Status: Full Code  Contact:  Primary Emergency Contact: Proehl,Johnnie, Home Phone: [phone]  ???  DWA DR MILIN  ???  Author:  Bascom Levels, MD  [phone]  02/02/2017 3:14 PM

## 2017-02-03 LAB — Legionella Urinary Antigen: LEGIONELLA URINARY ANTIGEN: NEGATIVE

## 2017-02-03 LAB — Differential Automated: ABSOLUTE IMMATURE GRAN COUNT: 0.08 10*3/uL — ABNORMAL HIGH (ref 0.00–0.04)

## 2017-02-03 LAB — CBC: NEUTROPHILS ABS (PRELIM): 7.78 10*3/uL (ref 13.5–17.1)

## 2017-02-03 LAB — Glucose,POC
GLUCOSE,POC: 202 mg/dL — ABNORMAL HIGH (ref 65–99)
GLUCOSE,POC: 213 mg/dL — ABNORMAL HIGH (ref 65–99)
GLUCOSE,POC: 129 mg/dL — ABNORMAL HIGH (ref 65–99)

## 2017-02-03 LAB — Basic Metabolic Panel
POTASSIUM: 4.2 mmol/L (ref 3.6–5.3)
TOTAL CO2: 30 mmol/L (ref 20–30)

## 2017-02-03 LAB — MRSA Surveillance

## 2017-02-03 LAB — Bacterial Culture Respiratory

## 2017-02-03 LAB — Magnesium: MAGNESIUM: 1.5 meq/L (ref 1.4–1.9)

## 2017-02-03 MED ADMIN — APIXABAN 5 MG PO TABS: 5 mg | ORAL | @ 05:00:00 | Stop: 2017-02-08 | NDC 00003089431

## 2017-02-03 MED ADMIN — GABAPENTIN 400 MG PO CAPS: 800 mg | ORAL | @ 20:00:00 | Stop: 2017-02-22 | NDC 68084077401

## 2017-02-03 MED ADMIN — DIGOXIN 250 MCG PO TABS: 250 ug | ORAL | @ 01:00:00 | Stop: 2017-02-05 | NDC 00904592261

## 2017-02-03 MED ADMIN — ALBUTEROL SULFATE (2.5 MG/3ML) 0.083% IN NEBU: 2.5 mg | RESPIRATORY_TRACT | @ 13:00:00 | Stop: 2017-02-03 | NDC 00487950101

## 2017-02-03 MED ADMIN — FUROSEMIDE 40 MG PO TABS: 40 mg | ORAL | @ 16:00:00 | Stop: 2017-02-09 | NDC 51079007320

## 2017-02-03 MED ADMIN — DULOXETINE HCL 60 MG PO CPEP: 60 mg | ORAL | @ 16:00:00 | Stop: 2017-02-22 | NDC 68084067521

## 2017-02-03 MED ADMIN — BUSPIRONE HCL 5 MG PO TABS: 15 mg | ORAL | @ 16:00:00 | Stop: 2017-02-21 | NDC 16729020001

## 2017-02-03 MED ADMIN — CLOPIDOGREL BISULFATE 75 MG PO TABS: 75 mg | ORAL | @ 16:00:00 | Stop: 2017-02-13 | NDC 00904629461

## 2017-02-03 MED ADMIN — GABAPENTIN 400 MG PO CAPS: 800 mg | ORAL | @ 05:00:00 | Stop: 2017-02-22 | NDC 68084077401

## 2017-02-03 MED ADMIN — BUSPIRONE HCL 5 MG PO TABS: 15 mg | ORAL | @ 05:00:00 | Stop: 2017-02-21 | NDC 16729020001

## 2017-02-03 MED ADMIN — PREDNISONE 20 MG PO TABS: 40 mg | ORAL | @ 16:00:00 | Stop: 2017-02-06 | NDC 00054001820

## 2017-02-03 MED ADMIN — IPRATROPIUM BROMIDE 0.02 % IN SOLN: 500 ug | RESPIRATORY_TRACT | @ 07:00:00 | Stop: 2017-02-03 | NDC 00487980101

## 2017-02-03 MED ADMIN — PYRIDOXINE HCL 50 MG PO TABS: 100 mg | ORAL | @ 16:00:00 | Stop: 2017-02-22 | NDC 00536440801

## 2017-02-03 MED ADMIN — ALBUTEROL SULFATE (2.5 MG/3ML) 0.083% IN NEBU: 2.5 mg | RESPIRATORY_TRACT | @ 07:00:00 | Stop: 2017-02-03 | NDC 00487950101

## 2017-02-03 MED ADMIN — HYDROCODONE-ACETAMINOPHEN 5-325 MG PO TABS: 2 | ORAL | @ 08:00:00 | Stop: 2017-02-22 | NDC 00406012362

## 2017-02-03 MED ADMIN — CARVEDILOL 25 MG PO TABS: 25 mg | ORAL | @ 05:00:00 | Stop: 2017-02-09 | NDC 00904630361

## 2017-02-03 MED ADMIN — HYDROCODONE-ACETAMINOPHEN 5-325 MG PO TABS: 2 | ORAL | @ 01:00:00 | Stop: 2017-02-22 | NDC 00406012362

## 2017-02-03 MED ADMIN — HYDROCODONE-ACETAMINOPHEN 5-325 MG PO TABS: 2 | ORAL | @ 15:00:00 | Stop: 2017-02-22 | NDC 00406012362

## 2017-02-03 MED ADMIN — PRAVASTATIN SODIUM 40 MG PO TABS: 40 mg | ORAL | @ 05:00:00 | Stop: 2017-02-22 | NDC 51079078220

## 2017-02-03 MED ADMIN — INSULIN ASPART 100 UNIT/ML SC SOLN: 10 mL | SUBCUTANEOUS | @ 08:00:00 | Stop: 2017-02-04 | NDC 00169750111

## 2017-02-03 MED ADMIN — ALBUTEROL SULFATE (2.5 MG/3ML) 0.083% IN NEBU: 2.5 mg | RESPIRATORY_TRACT | @ 01:00:00 | Stop: 2017-02-03 | NDC 00487950101

## 2017-02-03 MED ADMIN — INSULIN ASPART 100 UNIT/ML SC SOLN: 10 mL | SUBCUTANEOUS | @ 02:00:00 | Stop: 2017-02-04 | NDC 00169750111

## 2017-02-03 MED ADMIN — IPRATROPIUM BROMIDE 0.02 % IN SOLN: 500 ug | RESPIRATORY_TRACT | @ 01:00:00 | Stop: 2017-02-03 | NDC 00487980101

## 2017-02-03 MED ADMIN — APIXABAN 5 MG PO TABS: 5 mg | ORAL | @ 16:00:00 | Stop: 2017-02-08 | NDC 00003089431

## 2017-02-03 MED ADMIN — CARVEDILOL 25 MG PO TABS: 25 mg | ORAL | @ 16:00:00 | Stop: 2017-02-09 | NDC 00904630361

## 2017-02-03 MED ADMIN — PANTOPRAZOLE SODIUM 40 MG PO TBEC: 40 mg | ORAL | @ 16:00:00 | Stop: 2017-02-22 | NDC 68084081309

## 2017-02-03 MED ADMIN — IPRATROPIUM BROMIDE 0.02 % IN SOLN: 500 ug | RESPIRATORY_TRACT | @ 13:00:00 | Stop: 2017-02-03 | NDC 00487980101

## 2017-02-03 MED ADMIN — INSULIN ASPART 100 UNIT/ML SC SOLN: 10 mL | SUBCUTANEOUS | @ 20:00:00 | Stop: 2017-02-04 | NDC 00169750111

## 2017-02-03 MED ADMIN — MAGNESIUM SULFATE 2 GM/50ML IV SOLN: 2 g | INTRAVENOUS | @ 20:00:00 | Stop: 2017-02-03 | NDC 44567042024

## 2017-02-03 MED ADMIN — DIGOXIN 250 MCG PO TABS: 250 ug | ORAL | @ 16:00:00 | Stop: 2017-02-05 | NDC 00904592261

## 2017-02-03 MED ADMIN — GABAPENTIN 400 MG PO CAPS: 800 mg | ORAL | @ 14:00:00 | Stop: 2017-02-22 | NDC 68084077401

## 2017-02-03 MED ADMIN — LEVOFLOXACIN 750 MG PO TABS: 750 mg | ORAL | @ 16:00:00 | Stop: 2017-02-07 | NDC 68084048301

## 2017-02-03 MED ADMIN — LOSARTAN POTASSIUM 50 MG PO TABS: 100 mg | ORAL | @ 16:00:00 | Stop: 2017-02-09 | NDC 68084034701

## 2017-02-03 NOTE — ED Notes
Walked patient around unit, about 250 feet.  Became slightly out of breath with exertion but otherwise very able to ambulate on own.

## 2017-02-03 NOTE — Other
Patients Clinical Goal:   Clinical Goal(s) for the Shift: Stable VS, safety, comfort  Identify possible barriers to advancing the care plan: none  Stability of the patient: Moderately Unstable - medium risk of patient condition declining or worsening    End of Shift Summary:    Patient's alert and oriented x4   NSR on the monitor, no complaints of chest pain   Noted dsypnea on exertion, +cough. No complaints of SOB noted at this time   VS taken and recorded   Needs met, will continue to monitor patient

## 2017-02-03 NOTE — Progress Notes
Report given to Andrew RN

## 2017-02-03 NOTE — ED Notes
Report given to Charya, RN

## 2017-02-03 NOTE — Progress Notes
Internal Medicine Progress Note      PMD: Wilburn Mylar, MD  DATE OF SERVICE: 02/03/2017  HOSPITAL DAY: 2  CHIEF COMPLAINT:   Chief Complaint   Patient presents with   ??? Shortness of Breath     dx with pneumonia at OSH on antibiotics with clear withe phelgm Denies fever.        Last 24 Hour/Overnight Events   - no acute events overnight, vss  - did not require spot dose lasix, net neg 250 cc overnight   - oxygen requirements down to 2-3 L 93%    Subjective/Review of Systems   Patient states SOB improving. Denies CP.    14-point ROS negative except as outlined above.    Medications     Scheduled:    apixaban 5 mg Oral BID   budesonide-formoterol 2 puff Inhalation BID   busPIRone 15 mg Oral BID   carvedilol 25 mg Oral BID   clopidogrel 75 mg Oral Daily   digoxin 250 mcg Oral Daily   DULoxetine 60 mg Oral Daily   furosemide 40 mg Oral Daily   gabapentin 800 mg Oral TID   levoFLOXacin 750 mg Oral Daily   losartan 100 mg Oral Daily   pantoprazole 40 mg Oral Daily   pravastatin 40 mg Oral QHS   predniSONE 40 mg Oral Daily   pyridoxine 100 mg Oral Daily      Continuous:     PRN:  insulin aspart **AND** dextrose, HYDROcodone-acetaminophen, LORazepam      Physical Exam   Temp:  [36.2 ???C (97.2 ???F)-36.8 ???C (98.2 ???F)] 36.4 ???C (97.6 ???F)  Heart Rate:  [60-79] 64  Resp:  [12-20] 14  BP: (134-175)/(52-74) 140/62  NBP Mean:  [78-101] 85  SpO2:  [88 %-99 %] 93 %   UOP: Urine:  [600 mL-900 mL] 900 mL I/Os:     Intake/Output Summary (Last 24 hours) at 02/03/17 1559  Last data filed at 02/03/17 1223   Gross per 24 hour   Intake             1036 ml   Output             1500 ml   Net             -464 ml     General: 76 AA well-developed???male???in no acute distress, alert and resting comfortably   Eyes: PERRL, EOMI  HEENT: OP clear, MMM, NC in place (3L), unable to appreciate JVD  CV: RRR, normal S1S2, no m/r/g  Pulm: mild crackles bilaterally, wheezing to anterior L chest, non-labored breathing Abd: NABS, soft, flat, NT, no masses or organomegaly  Skin: Warm and dry, no rashes  Ext: 3+ pitting edema bilaterally   Neuro: A&Ox3, CNII-XII intact, motor 5/5 throughout, sensation grossly intact    Laboratory Data   CBC:  Recent Labs      02/03/17   0634  02/02/17   0637  02/01/17   0925   WBC  10.43*  8.78  10.26*   NEUTABS  7.78*  6.77   --    HGB  13.7  13.8  15.5   HCT  43.3  40.1  46.3   MCV  90.6  86.1  88.7   PLT  144  152  204     BMP:  Recent Labs      02/03/17   0634  02/02/17   0637  02/01/17   0925   NA  140  140  140   K  4.2  4.4  4.0   CL  99  100  100   CO2  30  29  25    BUN  17  19  22    CREAT  1.00  0.97  0.86   GLUCOSE  120*  153*  210*   CALCIUM  9.4  9.0   --    MG  1.5  1.6   --        Microbiology   MRSA nares 02/01/17: No Methicillin-resistant Staphylococcus aureus isolated.   Sputum screen 02/01/17: <10 Epithelial cells per low power field   Resp bacterial Cx 02/01/17: <10 Epithelial cells per low power field   Legionella urine 02/01/17: negative    Imaging   No new imaging    Assessment/Plan   Ryan Shepard is a 76 y.o. male Ryan Shepard is a 76 y.o. male Ryan Shepard???is a 76 y.o.???male???with COPD, hx of tobacco use disorder, PAD s/p femoral graft, HTN,   ???  # acute on chronic respiratory failure   #grade 1 diastolic dysfunction   DDx:???COPD exacerbation, community vs hospital acquired pneumonia, less likely pulmonary congestion 2/2 CHF. Patient has an extensive tobacco use history with dx COPD and was recently admitted to OSH in [place] for PNA (undertreated augmentin not great for atypicals)  CXR 02/01/17 revealed increasing diffuse confluent airspace opacities within the left suprahilar region and perihilar region with interstitial prominence throughout the left lung and to lesser extent right lung concerning for multifocal aspiration/pneumonia. Aortic calcification tortuosity with cardiomegaly.  procalcitonin <0.1. Weight downtrending. PE: crackles bilaterally, 3+ LE edema, Bedside U/S revealed IVC of 2.7 cm, non-collapsable and RIJ ~ 6-7 cm, non-collapsable   - TTE 02/01/17 revealed grade 1 diastolic dysfunction, LVEF 70-75%  - strict ins/outs   - daily weights (103.4 kg on admission)  - start PO Lasix 40 mg daily   - goal net >???-500 cc, ok to spot dose PO lasix 20 mg   - daily CBC  - continue PO levofloxacin 750 mg daily   - keep SpO2 >???88 (on NC --> okay to use face mask)  - d/c duoneb q6h   - start symbicort 2 puffs BID with teaching   - continue Prednisone 40 mg PO daily x 5 days (12/6-)  - touch base with wife and case management regarding home oxygen (states he has 2 tanks and 1 portable, each tank lasts him about 3 days)   ???  #hx of Afib  - continuous cardiac monitoring   - continue home amiodarone 200 mg daily  - digoxin level 1.0 (theraputic for hx of HF) --> start digoxin 250 mcg daily   - continue home carvedilol 25 mg daily  BID   ???  #PAD: chronic/stable  PAD s/p femoral-femoral graft (2009)  - continue home apixaban 5 mg BID  - continue clopidogrel 75 mg daily   ???  #HTN chronic/stable  SBP 130s-170s, on home losartan-HCTZ (non-formulary), Cr stable  - start losartan 100 mg daily   - start HCTZ 25 mg daily   ???  #HLD chronic/stable  - continue home pravastatin 40 mg nightly   ???  #GERD  - continue pantoprazole 40 mg daily   ???  #DM2 chronic/stable  - holding home metformin  - accuchecks (GLC 125-240), patient on prednisone  - insulin sliding scale algorithm #2 --> re-evaluate in AM if need to increase to #3  - continue home gabapentin 800 mg ???TID   ???  #  depression   #anxiety  - continue buspirone 15 mg BID  - continue home duloxetine DR 60   - continue home lorazepam 2 mg PRN BID  ???  #chornic pain  Per patient ongoing lower back and lower extremity pain for several years  - continue home Norco 10-325 mg q6h PRN   - obtain collateral from PCP/CURES  ???CURES performed and notable for frequent norco 10/325mg  and xanax prescriptions from a total of 3 providers over the last year. Further collateral obtained from PMD with very different medication list than pt had.     Inpatient Checklist:  FEN/GI:???  Diet:   ????????????Diet carbohydrate controlled 2gm NA  Analgesia:???Norco  Sedation:???N/A  DVT Prophylaxis: on home apixaban and clopidogrel   GI Prophylaxis: on PPI  Glycemic control:???insulin aspart algorithm #2  Bowel regimen:???NA  ???  #Disposition: inpatient for COPD exacerbation, discharge tonight or tomorrow once home oxygen is confirmed, schedule PCP appt with [place] (patient would like to switch providers), schedule outpatient pain management appt  ???  Code Status: Full Code  Contact: ???Primary Emergency Contact: Abdulmohsen, Siefken (wife), Home Phone: [phone]  ???  Author:  Bascom Levels, MD  [phone]  02/03/17

## 2017-02-03 NOTE — Consults
.  Weekend Case Management Coverage referral received via page from [place], [place] [id number] [phone], anticipated dc 12/8, has home O2 established, need more.      1126  Returned call, informed to have patient/family member contact their DME provider.  If a new order is needed, MD to complete, provide to CM, CM to contact DME provider to extend home O2 services.    Rolanda Lundborg Raquel James, RN  Inpatient Clinical Case Manager (Weekend Coverage)  Pager [phone]

## 2017-02-03 NOTE — Progress Notes
02/03/17 1410   Time Calculation   Start Time 1402   Stop Time 1407   Doc Time (min) 5 min   Time Calculation (min) 5 min   Patient not seen due to Other (Comment)  (Pt ambulating with nursing, no PT needs)       Order received for PT evaluation. Chart review performed. Spoke with bedside RN. Patient is currently ambulating around unit with supervision without use of AD. Patient does not appear to have impaired mobility or skilled PT needs, therefore, will d/c PT evaluation at this time. Please re-order PT if pt experiences a change in functional status and PT will follow up accordingly. Thank you.     Bailey Mech, PT, DPT 02/03/2017  [phone]

## 2017-02-03 NOTE — Other
Patients Clinical Goal:   Clinical Goal(s) for the Shift: VSS  Identify possible barriers to advancing the care plan: none  Stability of the patient: Moderately Unstable - medium risk of patient condition declining or worsening    End of Shift Summary: Patient a/a/o x4, VSS on 4 L NC, sat at 93%. Echo done. Pain well control with PO meds. Pt was drowsy this am, more alert when family is here. Currently eating dinner, denies any distress. Continue to monitor

## 2017-02-04 ENCOUNTER — Ambulatory Visit: Payer: Commercial Managed Care - Pharmacy Benefit Manager

## 2017-02-04 LAB — Differential Automated: EOSINOPHIL PERCENT, AUTO: 0.6 (ref 0.20–0.80)

## 2017-02-04 LAB — Glucose,POC
GLUCOSE,POC: 225 mg/dL — ABNORMAL HIGH (ref 65–99)
GLUCOSE,POC: 243 mg/dL — ABNORMAL HIGH (ref 65–99)
GLUCOSE,POC: 331 mg/dL — ABNORMAL HIGH (ref 65–99)
GLUCOSE,POC: 177 mg/dL — ABNORMAL HIGH (ref 65–99)
GLUCOSE,POC: 157 mg/dL — ABNORMAL HIGH (ref 65–99)

## 2017-02-04 LAB — Basic Metabolic Panel: GLUCOSE: 216 mg/dL — ABNORMAL HIGH (ref 65–99)

## 2017-02-04 LAB — UA,Microscopic: RBCS: 0 {cells}/uL (ref 0–11)

## 2017-02-04 LAB — UA,Dipstick: KETONES: NEGATIVE (ref 1.005–1.030)

## 2017-02-04 LAB — Magnesium: MAGNESIUM: 1.6 meq/L (ref 1.4–1.9)

## 2017-02-04 LAB — CBC: PLATELET COUNT, AUTO: 141 10*3/uL — ABNORMAL LOW (ref 143–398)

## 2017-02-04 MED ORDER — GABAPENTIN 400 MG PO CAPS
800 mg | Freq: Three times a day (TID) | ORAL | Status: AC
Start: 2017-02-04 — End: 2017-02-22

## 2017-02-04 MED ORDER — BUDESONIDE-FORMOTEROL FUMARATE 160-4.5 MCG/ACT IN AERO
2 | Freq: Two times a day (BID) | RESPIRATORY_TRACT | 0 refills | 25.00 days | Status: AC
Start: 2017-02-04 — End: 2017-02-22
  Filled 2017-02-21: qty 10.2, 25d supply, fill #0

## 2017-02-04 MED ORDER — TIOTROPIUM BROMIDE MONOHYDRATE 18 MCG IN CAPS
18 ug | ORAL_CAPSULE | Freq: Every day | RESPIRATORY_TRACT | 0 refills | 30.00000 days | Status: AC
Start: 2017-02-04 — End: 2017-02-22
  Filled 2017-02-21: qty 30, 30d supply, fill #0

## 2017-02-04 MED ORDER — PREDNISONE 20 MG PO TABS
40 mg | ORAL_TABLET | Freq: Every day | ORAL | 0 refills | 1.00 days | Status: AC
Start: 2017-02-04 — End: 2017-02-06
  Filled 2017-02-21: qty 2, 1d supply, fill #0

## 2017-02-04 MED ORDER — FUROSEMIDE 40 MG PO TABS
40 mg | ORAL_TABLET | Freq: Every day | ORAL | 0 refills | 30.00 days | Status: AC
Start: 2017-02-04 — End: 2017-02-22
  Filled 2017-02-21: qty 30, 30d supply, fill #0

## 2017-02-04 MED ORDER — SENNOSIDES 8.6 MG PO TABS
1 | ORAL_TABLET | Freq: Every evening | ORAL | 0 refills | 30.00 days | Status: AC | PRN
Start: 2017-02-04 — End: ?
  Filled 2017-02-21: qty 30, 30d supply, fill #0

## 2017-02-04 MED ORDER — LEVOFLOXACIN 750 MG PO TABS
750 mg | ORAL_TABLET | Freq: Every day | ORAL | 0 refills | 4.00000 days | Status: AC
Start: 2017-02-04 — End: 2017-02-06
  Filled 2017-02-21: qty 4, 4d supply, fill #0

## 2017-02-04 MED ADMIN — INSULIN ASPART 100 UNIT/ML SC SOLN: 10 mL | SUBCUTANEOUS | @ 21:00:00 | Stop: 2017-02-05 | NDC 00169750111

## 2017-02-04 MED ADMIN — INSULIN ASPART 100 UNIT/ML SC SOLN: 10 mL | SUBCUTANEOUS | @ 06:00:00 | Stop: 2017-02-05 | NDC 00169750111

## 2017-02-04 MED ADMIN — APIXABAN 5 MG PO TABS: 5 mg | ORAL | @ 17:00:00 | Stop: 2017-02-08 | NDC 00003089431

## 2017-02-04 MED ADMIN — GABAPENTIN 400 MG PO CAPS: 800 mg | ORAL | Stop: 2017-02-22 | NDC 68084077401

## 2017-02-04 MED ADMIN — BUSPIRONE HCL 5 MG PO TABS: 15 mg | ORAL | @ 21:00:00 | Stop: 2017-02-21 | NDC 16729020001

## 2017-02-04 MED ADMIN — PREDNISONE 20 MG PO TABS: 40 mg | ORAL | @ 17:00:00 | Stop: 2017-02-06 | NDC 00054001820

## 2017-02-04 MED ADMIN — MAGNESIUM SULFATE 4 GM/100ML IV SOLN: 4 g | INTRAVENOUS | @ 17:00:00 | Stop: 2017-02-04 | NDC 63323010601

## 2017-02-04 MED ADMIN — INSULIN ASPART 100 UNIT/ML SC SOLN: 10 mL | SUBCUTANEOUS | @ 02:00:00 | Stop: 2017-02-04 | NDC 00169750111

## 2017-02-04 MED ADMIN — BUDESONIDE-FORMOTEROL FUMARATE 160-4.5 MCG/ACT IN AERO: 2 | RESPIRATORY_TRACT | @ 05:00:00 | Stop: 2017-02-22 | NDC 00186037028

## 2017-02-04 MED ADMIN — PYRIDOXINE HCL 50 MG PO TABS: 100 mg | ORAL | @ 18:00:00 | Stop: 2017-02-22 | NDC 00536440801

## 2017-02-04 MED ADMIN — LEVOFLOXACIN 750 MG PO TABS: 750 mg | ORAL | @ 17:00:00 | Stop: 2017-02-07 | NDC 68084048301

## 2017-02-04 MED ADMIN — BUDESONIDE-FORMOTEROL FUMARATE 160-4.5 MCG/ACT IN AERO: 2 | RESPIRATORY_TRACT | Stop: 2017-02-22 | NDC 00186037028

## 2017-02-04 MED ADMIN — INSULIN ASPART 100 UNIT/ML SC SOLN: 10 mL | SUBCUTANEOUS | @ 18:00:00 | Stop: 2017-02-05 | NDC 00169750111

## 2017-02-04 MED ADMIN — TIOTROPIUM BROMIDE MONOHYDRATE 18 MCG IN CAPS: 18 ug | RESPIRATORY_TRACT | @ 15:00:00 | Stop: 2017-02-09 | NDC 00597007575

## 2017-02-04 MED ADMIN — GABAPENTIN 400 MG PO CAPS: 800 mg | ORAL | @ 18:00:00 | Stop: 2017-02-22 | NDC 68084077401

## 2017-02-04 MED ADMIN — GABAPENTIN 400 MG PO CAPS: 800 mg | ORAL | @ 05:00:00 | Stop: 2017-02-22 | NDC 68084077401

## 2017-02-04 MED ADMIN — ALBUTEROL SULFATE (2.5 MG/3ML) 0.083% IN NEBU: 2.5 mg | RESPIRATORY_TRACT | @ 21:00:00 | Stop: 2017-02-06 | NDC 00487950101

## 2017-02-04 MED ADMIN — PRAVASTATIN SODIUM 40 MG PO TABS: 40 mg | ORAL | @ 05:00:00 | Stop: 2017-02-22 | NDC 51079078220

## 2017-02-04 MED ADMIN — DIGOXIN 250 MCG PO TABS: 250 ug | ORAL | @ 17:00:00 | Stop: 2017-02-05 | NDC 00904592261

## 2017-02-04 MED ADMIN — HYDROCODONE-ACETAMINOPHEN 5-325 MG PO TABS: 2 | ORAL | @ 01:00:00 | Stop: 2017-02-22 | NDC 00406012362

## 2017-02-04 MED ADMIN — CLOPIDOGREL BISULFATE 75 MG PO TABS: 75 mg | ORAL | @ 17:00:00 | Stop: 2017-02-13 | NDC 00904629461

## 2017-02-04 MED ADMIN — FUROSEMIDE 40 MG PO TABS: 40 mg | ORAL | @ 15:00:00 | Stop: 2017-02-09 | NDC 51079007320

## 2017-02-04 MED ADMIN — BUDESONIDE-FORMOTEROL FUMARATE 160-4.5 MCG/ACT IN AERO: 2 | RESPIRATORY_TRACT | @ 15:00:00 | Stop: 2017-02-22 | NDC 00186037028

## 2017-02-04 MED ADMIN — PANTOPRAZOLE SODIUM 40 MG PO TBEC: 40 mg | ORAL | @ 17:00:00 | Stop: 2017-02-22 | NDC 68084081309

## 2017-02-04 MED ADMIN — HYDROCODONE-ACETAMINOPHEN 5-325 MG PO TABS: 2 | ORAL | @ 17:00:00 | Stop: 2017-02-22 | NDC 00406012362

## 2017-02-04 MED ADMIN — CARVEDILOL 25 MG PO TABS: 25 mg | ORAL | @ 05:00:00 | Stop: 2017-02-09 | NDC 00904630361

## 2017-02-04 MED ADMIN — DULOXETINE HCL 60 MG PO CPEP: 60 mg | ORAL | @ 17:00:00 | Stop: 2017-02-22 | NDC 68084067521

## 2017-02-04 MED ADMIN — CARVEDILOL 25 MG PO TABS: 25 mg | ORAL | @ 17:00:00 | Stop: 2017-02-09 | NDC 00904630361

## 2017-02-04 MED ADMIN — BUSPIRONE HCL 5 MG PO TABS: 15 mg | ORAL | @ 05:00:00 | Stop: 2017-02-21 | NDC 16729020001

## 2017-02-04 MED ADMIN — HYDROCODONE-ACETAMINOPHEN 5-325 MG PO TABS: 2 | ORAL | Stop: 2017-02-22 | NDC 00406012362

## 2017-02-04 MED ADMIN — HYDROCODONE-ACETAMINOPHEN 5-325 MG PO TABS: 2 | ORAL | @ 10:00:00 | Stop: 2017-02-22 | NDC 00406012362

## 2017-02-04 MED ADMIN — LOSARTAN POTASSIUM 50 MG PO TABS: 100 mg | ORAL | @ 17:00:00 | Stop: 2017-02-09 | NDC 68084034701

## 2017-02-04 MED ADMIN — APIXABAN 5 MG PO TABS: 5 mg | ORAL | @ 05:00:00 | Stop: 2017-02-08 | NDC 00003089431

## 2017-02-04 NOTE — Nursing Note
@  5 SBAR received from night shift RN at bedside. Pt denies SOB currently.   @0800  Sitting upright in bed, poc discussed, vss. CXR done.   @0805  Paged Gwenyth Bouillon MD re: mg 1.6, and to clarify if patient needs to be placed in isolation, dx- pneumonia 11/23 at LV per patient completed antibiotics

## 2017-02-04 NOTE — Other
Patients Clinical Goal:   Clinical Goal(s) for the Shift: vss, safety, comfort, maintain 02 sat btween 88-92%   Identify possible barriers to advancing the care plan:   Stability of the patient: Moderately Stable - low risk of patient condition declining or worsening   End of Shift Summary:       1926 pt stable, pt bed ready upstairs, transport at bedside, per Dr. Sula Soda ''pt is to DC in the am, pt to stay in 10 bed, I will call and tell bed control''  0517 Handoff to [place] RN           Dx: Acute on chronic respiratory failure    AOX4. VSS. NSR on tele. Denies CP/SOB. 3L NC. 02/02/17 BM, Voids per urinal. AC/HS. 3+ BLE edema. BMAT 3 w/ walker. Skin intact.     Plan:   -Maintain O2 sat at 88-92%   -Pain control   -02/04/17 DC home, wife to bring in portable O2

## 2017-02-04 NOTE — Progress Notes
Internal Medicine Progress Note      PMD: Wilburn Mylar, MD  DATE OF SERVICE: 02/04/2017  HOSPITAL DAY: 3  CHIEF COMPLAINT:   Chief Complaint   Patient presents with   ??? Shortness of Breath     dx with pneumonia at OSH on antibiotics with clear withe phelgm Denies fever.        Last 24 Hour/Overnight Events   - early this AM paged by RN that patient was desaturating <80% on NC and was put on simple face mask for 5 minutes with improvement in SpO2 and was put back on 3L NC  - PO lasix 40 mg given early  - increased SOB and crackles on exam today   - repleted for Mg 1.6    Subjective/Review of Systems   Patient reports increased SOB but denies CP.     14-point ROS negative except as outlined above.    Medications     Scheduled:    apixaban 5 mg Oral BID   budesonide-formoterol 2 puff Inhalation BID   busPIRone 15 mg Oral BID   carvedilol 25 mg Oral BID   clopidogrel 75 mg Oral Daily   digoxin 250 mcg Oral Daily   DULoxetine 60 mg Oral Daily   furosemide 40 mg Oral Daily   gabapentin 800 mg Oral TID   levoFLOXacin 750 mg Oral Daily   losartan 100 mg Oral Daily   magnesium sulfate 4 g Intravenous Once   pantoprazole 40 mg Oral Daily   pravastatin 40 mg Oral QHS   predniSONE 40 mg Oral Daily   pyridoxine 100 mg Oral Daily   tiotropium 18 mcg Inhalation Daily      Continuous:     PRN:  insulin aspart **AND** dextrose, HYDROcodone-acetaminophen, LORazepam      Physical Exam   Temp:  [36.4 ???C (97.6 ???F)-36.7 ???C (98.1 ???F)] 36.7 ???C (98.1 ???F)  Heart Rate:  [63-77] 69  Resp:  [11-17] 14  BP: (128-175)/(58-83) 128/83  NBP Mean:  [85-101] 95  SpO2:  [90 %-94 %] 91 %   UOP: Urine:  [700 mL-900 mL] 700 mL I/Os:     Vitals:    02/04/17 0645   Weight: (!) 104 kg (229 lb 4.5 oz)       Intake/Output Summary (Last 24 hours) at 02/04/17 0807  Last data filed at 02/04/17 0300   Gross per 24 hour   Intake              330 ml   Output             1600 ml   Net            -1270 ml General: 76 AA well-developed???male???in no acute distress, appears concerned about his breathing    Eyes: PERRL, EOMI  HEENT: OP clear, MMM, NC in place (3L)  CV: RRR, normal S1S2, no m/r/g  Pulm: increased crackles bilaterally, no wheezingnon  Abd: NABS, soft, flat, NT, no masses or organomegaly  Skin: Warm and dry, no rashes  Ext: 3+ pitting edema bilaterally   Neuro: A&Ox3, CNII-XII intact, motor 5/5 throughout, sensation grossly intact    Laboratory Data   CBC:  Recent Labs      02/04/17   0417  02/03/17   0634  02/02/17   0637   WBC  11.23*  10.43*  8.78   NEUTABS  8.98*  7.78*  6.77   HGB  13.5  13.7  13.8   HCT  40.9  43.3  40.1   MCV  89.9  90.6  86.1   PLT  141*  144  152     BMP:  Recent Labs      02/04/17   0417  02/03/17   0634  02/02/17   0637   NA  135  140  140   K  4.3  4.2  4.4   CL  95*  99  100   CO2  31*  30  29   BUN  18  17  19    CREAT  0.87  1.00  0.97   GLUCOSE  216*  120*  153*   CALCIUM  8.8  9.4  9.0   MG  1.6  1.5  1.6       Microbiology   MRSA nares 02/01/17: No Methicillin-resistant Staphylococcus aureus isolated.   Sputum screen 02/01/17: <10 Epithelial cells per low power field   Resp bacterial Cx 02/01/17: <10 Epithelial cells per low power field   Legionella urine 02/01/17: negative    Imaging   Ordered CXR    Assessment/Plan   Ryan Shepard is a 76 y.o. male with COPD, hx of tobacco use disorder, PAD s/p femoral graft, HTN, DM2, HLD, A.fib, GERD, depression, anxiety, chronic pain, who presents with intermittent SOB and LE edema for the past week. Patient has polypharmacy and needs good outpatient med reconciliation. Patient would like to transfer his care to [facility] and be set up with a new PCP.     # acute on chronic respiratory failure   #grade 1 diastolic dysfunction   DDx:???COPD exacerbation, community vs hospital acquired pneumonia, less likely pulmonary congestion 2/2 CHF. Patient has an extensive tobacco use history with dx COPD and was recently admitted to OSH in [place] for PNA (undertreated augmentin not great for atypicals)  CXR 02/01/17 revealed increasing diffuse confluent airspace opacities within the left suprahilar region and perihilar region with interstitial prominence throughout the left lung and to lesser extent right lung concerning for multifocal aspiration/pneumonia. Aortic calcification tortuosity with cardiomegaly.  procalcitonin <0.1. Weight downtrending.   PE: crackles bilaterally, 3+ LE edema, Bedside U/S revealed IVC of 2.7 cm, non-collapsable and RIJ ~ 6-7 cm, non-collapsable   - TTE 02/01/17 revealed grade 1 diastolic dysfunction, LVEF 70-75%  - strict ins/outs   - daily weights (103.4 kg on admission)  - continue PO Lasix 40 mg daily   - goal net >???-500 cc, ok to spot dose PO lasix 20 mg   - daily CBC  - continue PO levofloxacin 750 mg daily   - keep SpO2 >???88 (on NC --> okay to use face mask)  - symbicort 2 puffs BID with teaching   - continue Prednisone 40 mg PO daily x 5 days (12/6-)  - touch base with wife and case management regarding home oxygen (states he has 2 tanks and 1 portable, each tank lasts him about 3 days)   - follow repeat CXR to assess for increasing pulmonary congestion     #hx of Afib  - continuous cardiac monitoring   - d/c home amiodarone 200 mg daily  - digoxin 250 mcg daily   - continue home carvedilol 25 mg daily ???BID   ???  #PAD: chronic/stable  PAD s/p femoral-femoral graft (2009)  - continue home apixaban 5 mg BID  - continue clopidogrel 75 mg daily   ???  #HTN chronic/stable  SBP 130s-170s, on home losartan-HCTZ (non-formulary), Cr stable  - continue losartan  100 mg daily   ???  #HLD chronic/stable  - continue home pravastatin 40 mg nightly   ???  #GERD  - continue pantoprazole 40 mg daily   ???  #DM2 chronic/stable  - holding home metformin  - accuchecks (GLC 125-240), patient on prednisone  - insulin sliding scale algorithm #3  - continue home gabapentin 800 mg ???TID   ???  #depression   #anxiety  - continue buspirone 15 mg BID - continue home duloxetine DR 60   - continue home lorazepam 2 mg PRN BID  ???  #chornic pain  Per patient ongoing lower back and lower extremity pain for several years  - continue home Norco 10-325 mg q6h PRN   - schedule with outpatient pain management at [facility]   ???  Inpatient Checklist:  FEN/GI:???  Diet:   ????????????Diet carbohydrate controlled 2gm NA  Analgesia:???Norco  Sedation:???N/A  DVT Prophylaxis: on home apixaban and clopidogrel   GI Prophylaxis: on PPI  Glycemic control:???insulin aspart algorithm #2  Bowel regimen:???NA  ???  #Disposition: inpatient for COPD exacerbation, discharge possibly today or tomorrow tomorrow once respiratory status stable, home oxygen is confirmed, schedule PCP appt with [place] (patient would like to switch providers), schedule outpatient pain management appt  ???  Code Status: Full Code  Contact: ???Primary Emergency Contact: Trayvon, Beno (wife), Home Phone: [phone]    DWA Dr Ramiro Harvest ???    Author:  Bascom Levels, MD  [phone]  02/04/17    Attending Addendum:  On 12/9, patient seen and examined, labs and imaging reviewed personally by me- agree with findings above. Formulated, discussed and agree with assessment and plan as documented above.  - On Sunday, had a slight desat, sounded more ronchorous.   - Episode of brady Sunday afternoon - possibly adjust coreg or digoxin  - Patient apparently told the nurses he may have STI, w/u initiated, wife currently unaware  - Unclear if he has adequate oxygen supplies at home. CM should probably get them a new vendor.    Patient Active Problem List   Diagnosis   ??? Lower back pain   ??? 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 OA (osteoarthritis) of knee   ??? Left knee pain   ??? Left shoulder pain   ??? Primary osteoarthritis of knees, bilateral   ??? COPD exacerbation (HCC/RAF)   ??? COPD (chronic obstructive pulmonary disease) (HCC/RAF)   ??? Chronic prescription opiate use   ??? Diabetes (HCC/RAF)   ??? PAD (peripheral artery disease) (HCC/RAF) ??? Hypertension   ??? Hypercholesteremia   ??? GERD (gastroesophageal reflux disease)   ??? Pneumonia   ??? Lower extremity edema   ??? Memory loss   ??? Genital herpes   ??? Acute and chronic respiratory failure with hypoxia (HCC/RAF)       Grayland Jack. Ramiro Harvest, MD  02/17/2017  9:57 AM

## 2017-02-05 ENCOUNTER — Telehealth: Payer: PRIVATE HEALTH INSURANCE

## 2017-02-05 ENCOUNTER — Ambulatory Visit: Payer: MEDICARE

## 2017-02-05 LAB — Glucose,POC
GLUCOSE,POC: 249 mg/dL — ABNORMAL HIGH (ref 65–99)
GLUCOSE,POC: 268 mg/dL — ABNORMAL HIGH (ref 65–99)
GLUCOSE,POC: 151 mg/dL — ABNORMAL HIGH (ref 65–99)
GLUCOSE,POC: 282 mg/dL — ABNORMAL HIGH (ref 65–99)

## 2017-02-05 LAB — Neisseria gonorrhoeae PCR

## 2017-02-05 LAB — Digoxin: DIGOXIN: 1.1 ng/mL (ref 0.5–2.2)

## 2017-02-05 LAB — Bacterial Culture-Gm Stain

## 2017-02-05 LAB — CBC: RED CELL DISTRIBUTION WIDTH-SD: 46.2 fL (ref 36.9–48.3)

## 2017-02-05 LAB — Differential Automated: NEUTROPHIL PERCENT, AUTO: 75.7 (ref 0.00–0.50)

## 2017-02-05 LAB — Magnesium: MAGNESIUM: 1.7 meq/L (ref 1.4–1.9)

## 2017-02-05 LAB — Chlamydia trachomatis PCR

## 2017-02-05 LAB — Basic Metabolic Panel
CALCIUM: 9.3 mg/dL (ref 8.6–10.4)
GFR ESTIMATE FOR AFRICAN AMERICAN: >89 mg/dL (ref 0.60–1.30)

## 2017-02-05 LAB — Bacterial Culture Respiratory

## 2017-02-05 MED ADMIN — HYDROCODONE-ACETAMINOPHEN 5-325 MG PO TABS: 2 | ORAL | @ 05:00:00 | Stop: 2017-02-22 | NDC 00406012362

## 2017-02-05 MED ADMIN — PANTOPRAZOLE SODIUM 40 MG PO TBEC: 40 mg | ORAL | @ 17:00:00 | Stop: 2017-02-22 | NDC 68084081309

## 2017-02-05 MED ADMIN — CLOPIDOGREL BISULFATE 75 MG PO TABS: 75 mg | ORAL | @ 17:00:00 | Stop: 2017-02-13 | NDC 00904629461

## 2017-02-05 MED ADMIN — HYDROCODONE-ACETAMINOPHEN 5-325 MG PO TABS: 2 | ORAL | @ 21:00:00 | Stop: 2017-02-22 | NDC 00406012362

## 2017-02-05 MED ADMIN — INSULIN ASPART 100 UNIT/ML SC SOPN: 3 mL | SUBCUTANEOUS | @ 20:00:00 | Stop: 2017-02-22 | NDC 00169633910

## 2017-02-05 MED ADMIN — DULOXETINE HCL 60 MG PO CPEP: 60 mg | ORAL | @ 17:00:00 | Stop: 2017-02-22 | NDC 68084067521

## 2017-02-05 MED ADMIN — PYRIDOXINE HCL 50 MG PO TABS: 100 mg | ORAL | @ 17:00:00 | Stop: 2017-02-22 | NDC 00536440801

## 2017-02-05 MED ADMIN — ACYCLOVIR 800 MG PO TABS: 800 mg | ORAL | @ 23:00:00 | Stop: 2017-02-05

## 2017-02-05 MED ADMIN — BUDESONIDE-FORMOTEROL FUMARATE 160-4.5 MCG/ACT IN AERO: 2 | RESPIRATORY_TRACT | @ 18:00:00 | Stop: 2017-02-22 | NDC 00186037028

## 2017-02-05 MED ADMIN — TIOTROPIUM BROMIDE MONOHYDRATE 18 MCG IN CAPS: 18 ug | RESPIRATORY_TRACT | @ 18:00:00 | Stop: 2017-02-09 | NDC 00597007575

## 2017-02-05 MED ADMIN — ALBUTEROL SULFATE (2.5 MG/3ML) 0.083% IN NEBU: 2.5 mg | RESPIRATORY_TRACT | @ 08:00:00 | Stop: 2017-02-06 | NDC 00487950101

## 2017-02-05 MED ADMIN — INSULIN ASPART 100 UNIT/ML SC SOPN: 3 mL | SUBCUTANEOUS | @ 17:00:00 | Stop: 2017-02-22

## 2017-02-05 MED ADMIN — INSULIN ASPART 100 UNIT/ML SC SOPN: 3 mL | SUBCUTANEOUS | @ 17:00:00 | Stop: 2017-02-22 | NDC 00169633910

## 2017-02-05 MED ADMIN — BUSPIRONE HCL 5 MG PO TABS: 15 mg | ORAL | @ 04:00:00 | Stop: 2017-02-21 | NDC 16729020001

## 2017-02-05 MED ADMIN — LOSARTAN POTASSIUM 50 MG PO TABS: 100 mg | ORAL | @ 17:00:00 | Stop: 2017-02-09 | NDC 68084034701

## 2017-02-05 MED ADMIN — ACYCLOVIR 800 MG PO TABS: 800 mg | ORAL | @ 23:00:00 | Stop: 2017-02-06 | NDC 68084010901

## 2017-02-05 MED ADMIN — GABAPENTIN 400 MG PO CAPS: 800 mg | ORAL | @ 20:00:00 | Stop: 2017-02-22 | NDC 68084077401

## 2017-02-05 MED ADMIN — INSULIN ASPART 100 UNIT/ML SC SOLN: 10 mL | SUBCUTANEOUS | @ 02:00:00 | Stop: 2017-02-05 | NDC 00169750111

## 2017-02-05 MED ADMIN — PRAVASTATIN SODIUM 40 MG PO TABS: 40 mg | ORAL | @ 04:00:00 | Stop: 2017-02-22 | NDC 51079078220

## 2017-02-05 MED ADMIN — APIXABAN 5 MG PO TABS: 5 mg | ORAL | @ 04:00:00 | Stop: 2017-02-08 | NDC 00003089431

## 2017-02-05 MED ADMIN — INSULIN ASPART 100 UNIT/ML SC SOPN: 3 mL | SUBCUTANEOUS | @ 07:00:00 | Stop: 2017-02-22 | NDC 00169633910

## 2017-02-05 MED ADMIN — ALBUTEROL SULFATE (2.5 MG/3ML) 0.083% IN NEBU: 2.5 mg | RESPIRATORY_TRACT | @ 21:00:00 | Stop: 2017-02-06 | NDC 00487950101

## 2017-02-05 MED ADMIN — BUSPIRONE HCL 5 MG PO TABS: 15 mg | ORAL | @ 17:00:00 | Stop: 2017-02-21 | NDC 16729020001

## 2017-02-05 MED ADMIN — HYDROCODONE-ACETAMINOPHEN 5-325 MG PO TABS: 2 | ORAL | @ 15:00:00 | Stop: 2017-02-22 | NDC 00406012362

## 2017-02-05 MED ADMIN — PREDNISONE 20 MG PO TABS: 40 mg | ORAL | @ 17:00:00 | Stop: 2017-02-06 | NDC 00054001820

## 2017-02-05 MED ADMIN — DOCUSATE SODIUM 100 MG PO CAPS: 100 mg | ORAL | @ 17:00:00 | Stop: 2017-02-22 | NDC 00904645561

## 2017-02-05 MED ADMIN — LEVOFLOXACIN 750 MG PO TABS: 750 mg | ORAL | @ 17:00:00 | Stop: 2017-02-07 | NDC 68084048301

## 2017-02-05 MED ADMIN — APIXABAN 5 MG PO TABS: 5 mg | ORAL | @ 17:00:00 | Stop: 2017-02-08 | NDC 00003089431

## 2017-02-05 MED ADMIN — GABAPENTIN 400 MG PO CAPS: 800 mg | ORAL | @ 04:00:00 | Stop: 2017-02-22 | NDC 68084077401

## 2017-02-05 MED ADMIN — ALBUTEROL SULFATE (2.5 MG/3ML) 0.083% IN NEBU: 2.5 mg | RESPIRATORY_TRACT | @ 01:00:00 | Stop: 2017-02-06 | NDC 00487950101

## 2017-02-05 MED ADMIN — CARVEDILOL 25 MG PO TABS: 25 mg | ORAL | @ 17:00:00 | Stop: 2017-02-09 | NDC 00904630361

## 2017-02-05 MED ADMIN — ALBUTEROL SULFATE (2.5 MG/3ML) 0.083% IN NEBU: 2.5 mg | RESPIRATORY_TRACT | @ 14:00:00 | Stop: 2017-02-06 | NDC 00487950101

## 2017-02-05 MED ADMIN — CARVEDILOL 25 MG PO TABS: 25 mg | ORAL | @ 04:00:00 | Stop: 2017-02-09 | NDC 00904630361

## 2017-02-05 MED ADMIN — FUROSEMIDE 40 MG PO TABS: 40 mg | ORAL | @ 17:00:00 | Stop: 2017-02-09 | NDC 51079007320

## 2017-02-05 MED ADMIN — BUDESONIDE-FORMOTEROL FUMARATE 160-4.5 MCG/ACT IN AERO: 2 | RESPIRATORY_TRACT | @ 08:00:00 | Stop: 2017-02-22 | NDC 00186037028

## 2017-02-05 MED ADMIN — GABAPENTIN 400 MG PO CAPS: 800 mg | ORAL | @ 15:00:00 | Stop: 2017-02-22 | NDC 68084077401

## 2017-02-05 MED ADMIN — POLYETHYLENE GLYCOL 3350 17 G PO PACK: 17 g | ORAL | @ 17:00:00 | Stop: 2017-02-22 | NDC 68084043098

## 2017-02-05 NOTE — Telephone Encounter
Good morning,    Patient Ryan Shepard [id number]    Is the patient being discharged to SNF? No  Clinic/Test/Procedure to be scheduled:[facility]  Doctor Preferences (If applicable): first available  Time Frame: 5 days  Reason for Appointment/Procedure: establish care, post dc follow-up    PCP  Dr. Torrie Mayers, 02/12/2017 Monday @ 4.20 pm   [address] [address]    Per navigator: confirmed PCP appointment with the patient in room    I will mail out confirmation letter as well.    [facility]  P [phone]  F [phone]

## 2017-02-05 NOTE — Consults
FINAL DISCHARGE MULTIDISCIPLINARY NOTE  Department of Care Coordination      Admit [id number]  Anticipated Date of Discharge: 02/06/2017    Following UU:VOZDG, [name], MD    Home Address:[address]  [place] [address]      DISCHARGE INFORMATION:     Discharge Address: [address] [place] [address]    Individual(s) notified of discharge plan:  Contact Name: Shepard, Ryan Relationship: Self   Contact Number(s): [phone]/ [phone]      Support Systems: Spouse               [facility] Choice List: Provided to Pt/Family Date Provided: 02/05/17   Freedom of Choice: Educated and Provided, [place]       Final Discharge Needs: [place] and/or [place] Coordination (if applicable):   Agency Name: [facility] Person: Admissions via [facility]  Phone Number(s): [phone]  Fax Number: [phone]  Start of Care Date: 02/06/17  Services: Nursing, Physical Therapy  Comments: freedom of choice presented to pt, agreed with [place] and/or Respiratory Equipment (if applicable):   Equipment Provided: N/A  Comments: Pt states he is already established with home oxygen - attempted to call wife to confirm vendor but wife was busy (works during day at pre[employer] she owns)     Pt with home oxygen concentrator and e-tanks at home.       Ryan Shepard,  02/05/2017

## 2017-02-05 NOTE — Other
Patients Clinical Goal:   Clinical Goal(s) for the Shift: vss, safety, respiratory stability, infection control   Identify possible barriers to advancing the care plan:   Stability of the patient: Moderately Unstable - medium risk of patient condition declining or worsening    End of Shift Summary: Neuro- AAOx3, Afebrile. Moves all extremities. Bed bath given by CP. Cardiac- NSR, Denies CP, non symptomatic. GI- Denies N/V, GU- voids. Penile discharge noted, urine specimen sent and reported to MD with orders provided. Denies SOB, NC 3 LPM. Pain controlled. VSS.

## 2017-02-05 NOTE — Progress Notes
Internal Medicine Progress Note      PMD: Wilburn Mylar, MD  DATE OF SERVICE: 02/05/2017  HOSPITAL DAY: 4  CHIEF COMPLAINT:   Chief Complaint   Patient presents with   ??? Shortness of Breath     dx with pneumonia at OSH on antibiotics with clear withe phelgm Denies fever.        Last 24 Hour/Overnight Events   - asymptomatic bradycardia to 40s  - patient noted penile ulcers and discharge, requested STI testing (wife not aware)    Subjective/Review of Systems   Patient states SOB improving. 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    Medications     Scheduled:    acyclovir 800 mg Oral BID   albuterol 2.5 mg Nebulization Q6H   apixaban 5 mg Oral BID   budesonide-formoterol 2 puff Inhalation BID   busPIRone 15 mg Oral BID   carvedilol 25 mg Oral BID   clopidogrel 75 mg Oral Daily   docusate 100 mg Oral BID   DULoxetine 60 mg Oral Daily   furosemide 40 mg Oral Daily   gabapentin 800 mg Oral TID   levoFLOXacin 750 mg Oral Daily   losartan 100 mg Oral Daily   pantoprazole 40 mg Oral Daily   polyethylene glycol 17 g Oral Daily   pravastatin 40 mg Oral QHS   predniSONE 40 mg Oral Daily   pyridoxine 100 mg Oral Daily   tiotropium 18 mcg Inhalation Daily      Continuous:     PRN:  insulin aspart **AND** dextrose, diphenhydrAMINE, HYDROcodone-acetaminophen, LORazepam, senna      Physical Exam   Temp:  [36.4 ???C (97.6 ???F)-37.1 ???C (98.8 ???F)] 36.7 ???C (98.1 ???F)  Heart Rate:  [63-79] 74  Resp:  [10-22] 22  BP: (116-143)/(59-99) 124/59  NBP Mean:  [78-109] 78  SpO2:  [88 %-94 %] 94 %   UOP: Urine:  [0 mL-750 mL] 0 mL I/Os:     Intake/Output Summary (Last 24 hours) at 02/05/17 1908  Last data filed at 02/05/17 1751   Gross per 24 hour   Intake             1510 ml   Output              850 ml   Net              660 ml General:  well-developed???male???in no acute distress, appears comfortabe   Eyes: PERRL, EOMI  HEENT: OP clear, MMM, NC in place (3L)  CV: RRR, normal S1S2, no m/r/g  Pulm: improved crackles bilaterally, mild audible wheezing   Abd: NABS, soft, flat, NT, no masses or organomegaly  GU: vesicles on penile meatus, tender, no significant exudate; appear to be unroofed vesicles   Skin: Warm and dry, no rashes  Ext: trace edema  Neuro: A&Ox3, CNII-XII intact, motor 5/5 throughout, sensation grossly intact    Laboratory Data   CBC:  Recent Labs      02/05/17   0648  02/04/17   0417  02/03/17   0634   WBC  10.13*  11.23*  10.43*   NEUTABS  7.67*  8.98*  7.78*   HGB  13.8  13.5  13.7   HCT  43.0  40.9  43.3   MCV  88.1  89.9  90.6   PLT  129*  141*  144     BMP:  Recent Labs      02/05/17   0648  02/04/17   0417  02/03/17   0634   NA  138  135  140   K  4.5  4.3  4.2   CL  96  95*  99   CO2  33*  31*  30   BUN  14  18  17    CREAT  0.80  0.87  1.00   GLUCOSE  145*  216*  120*   CALCIUM  9.3  8.8  9.4   MG  1.7  1.6  1.5     UA:  Recent Labs      02/04/17   1256   SPECGRAVUR  1.010   PHUR  6.0   BLDUR  Negative   KETONESUR  Negative   GLUCOSEUR  2+*   PROTCLUR  Negative   NITRITEUR  Negative   LEUKESTUR  Trace*   RBCSUR  0   WBCSUR  9     Results for ERVEY, FALLIN (MRN [id number]) as of 02/05/2017 07:05    02/04/2017 12:56  Urine Color: Straw  pH,Urine: 6.0  Specific Gravity: 1.010  Blood,Dipstick: Negative  Bili,Dipstick: Negative  Glucose,Random Urine: 2+ (A)  Ketones: Negative  Protein: Negative  Nitrite: Negative  Leukocyte Esterase: Trace (A)  RBC per uL: 0  WBC per uL: 9  Squamous Epi Cells: 1  Hyaline Casts: 0-2/LPF  RBC per HPF: 0  WBC per HPF: 2    Microbiology   Bacterial Culture Respiratory 02/01/17: Few Probable candida albicans , Aspergillus species    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   Sputum screen 02/01/17: <10 Epithelial cells per low power field   Resp bacterial Cx 02/01/17: <10 Epithelial cells per low power field   Legionella urine 02/01/17: negative    Imaging   CXR 02/04/17  Persistent airway thickening in the left lung and more confluent airspace opacity in the left perihilar region, more prominent in the upper lobe as well as airway thickening and patchy opacities in the right infrahilar/perihilar region, suspicious for   multifocal pneumonia without significant interval change, allowing for change in lung volumes. Follow-up to resolution suggested to exclude underlying mass lesion in the left perihilar region.Small left and probable trace right pleural effusion. No pneumothorax.  Cardiomediastinal silhouette unchanged.    Assessment/Plan   Ryan Shepard???is a 75 y.o.???male???with COPD, hx of tobacco use disorder, PAD s/p femoral graft, HTN, DM2, HLD, A.fib, GERD, depression, anxiety, chronic pain, who presents with intermittent SOB and LE edema???for the past week. Patient has polypharmacy and needs good outpatient med reconciliation. Patient would like to transfer his care to [facility] and be set up with a new PCP.     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 Patient has an extensive tobacco use history with dx COPD and was recently admitted to OSH in [place] for PNA (undertreated augmentin not great for atypicals)  CXR 02/01/17 revealed increasing diffuse confluent airspace opacities within the left suprahilar region and perihilar region with interstitial prominence throughout the left lung and to lesser extent right lung concerning for multifocal aspiration/pneumonia. Aortic calcification tortuosity with cardiomegaly.  procalcitonin <0.1. Weight downtrending.   PE: crackles bilaterally, 3+ LE edema, Bedside U/S revealed IVC of 2.7 cm, non-collapsable and RIJ ~ 6-7 cm, non-collapsable   - TTE 02/01/17 revealed grade 1 diastolic dysfunction, LVEF 70-75%  - strict ins/outs   - daily weights (103.4 kg on admission)  - continue PO Lasix 40 mg daily   - goal even to neg neg 500 cc, ok to spot dose PO lasix 20 mg   - daily CBC  - continue PO levofloxacin 750 mg daily, plan for 7-day course  - keep SpO2 >???88 (on NC --> okay to use face mask)  - symbicort 2 puffs BID with teaching   - continue Prednisone 40 mg PO daily x 5 days (12/6-) last day today  - touch base with wife and case management regarding home oxygen (states he has 2 tanks and 1 portable, each tank lasts him about 3 days)     #hx of Afib  - continuous cardiac monitoring   - d/c digoxin 250 mcg daily   - continue home carvedilol 25 mg daily ???BID   - continue home apixaban 5 mg BID  ???  #PAD: chronic/stable  PAD s/p femoral-femoral graft (2009)  - continue clopidogrel 75 mg daily     #HTN chronic/stable  SBP 130s-170s, on home losartan-HCTZ (non-formulary), Cr stable - continue losartan 100 mg daily   ???  #HLD chronic/stable  - continue home pravastatin 40 mg nightly   ???  #GERD  - continue pantoprazole 40 mg daily   ???  #DM2 chronic/stable  - holding home metformin  - accuchecks (GLC 125-240), patient on prednisone  - insulin sliding scale algorithm #3  - continue home gabapentin 800 mg ???TID   ???  #depression   #anxiety  - continue buspirone 15 mg BID  - continue home duloxetine DR 60   - continue home lorazepam 2 mg PRN BID  ???  #chornic pain  Per patient ongoing lower back and lower extremity pain for several years  - continue home Norco 10-325 mg q6h PRN   - schedule with outpatient pain management at [facility]   ???  Inpatient Checklist:  FEN/GI:???  Diet: Diet carbohydrate controlled 2gm NA  Analgesia:???Norco  Sedation:???N/A  DVT Prophylaxis: on home apixaban and clopidogrel   GI Prophylaxis: on PPI  Glycemic control:???insulin aspart algorithm #2  Bowel regimen:???NA  ???  #Disposition: inpatient for COPD exacerbation, discharge possibly tomorrow once aspergillus treatment and management is confirmed, schedule PCP appt with [place] (patient would like to switch providers), schedule outpatient pain management appt  ???  Code Status: Full Code  Contact: ???Primary Emergency Contact: [place], [place]itto (wife), Home Phone: [phone]  ???  DWA Dr Deatra James???  ???  Author:  Bascom Levels, MD  [phone]  02/05/17    Attending Attestation:  I saw and evaluated Ryan Shepard. I personally reviewed the interval physician notes, nursing notes, and allied health professional notes, telemetry data, imaging, labs and microbiologic information over the last 24 hours. I discussed the case with the resident and agree with the findings and plan of care as documented in the resident's note with the following addendums:     The following problems are being addressed in this hospitalization:  Current Hospital Problems           POA    Chronic prescription opiate use Not Applicable COPD (chronic obstructive pulmonary disease) (HCC/RAF) Yes    COPD exacerbation (HCC/RAF) Yes    Diabetes (HCC/RAF) Yes    Genital herpes Clinically Undetermined    GERD (gastroesophageal reflux disease) Yes    Hypercholesteremia Yes    Hypertension Yes    Lower back pain Yes    Lower extremity edema Unknown    Memory loss Unknown    OA (osteoarthritis) of knee Yes    PAD (peripheral artery disease) (HCC/RAF) Yes    Pneumonia Unknown    Primary osteoarthritis of knees, bilateral Yes        Patient has returned to baseline O2 requirement, stable symptoms. Last day of 5-day steroid course is today, continuing levofloxacin for total 7 days. 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 No cavitation or mass on CXR to suggest aspergilloma, but query fungal pneumonia. ABPA also possibly contributing to recurrent exacerbations. Will obtain CT chest, ABPA workup, and consider consultation with ID or pulm as inpatient pending this results. May need antifungal therapy. 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 Remained of STI workup (HIV, GC/CT, RPR) pending. Remaining problems and plan as outlined above in the resident's note.      Greater than 50% of a 45 minute encounter was spent on direct patient care activities, counseling of the patient and/or family, and coordination of care for the problems discussed in my note, as well as: discussion of medical plan and options for care, discussion with other treating/consulting physicians.    Otilio Carpen. Deatra James, MD

## 2017-02-05 NOTE — Progress Notes
NUTRITION IN-DEPTH SCREEN (Adult)    Admit Date: 02/01/2017     Date of Birth: [birth year] Gender: male MRN: [id number]     Date of Screening 02/05/2017   Subjective: Pt seen; Reports good appetite / eating well. No issues.   Problems: Active Problems:    Lower back pain POA: Yes    OA (osteoarthritis) of knee POA: Yes    Primary osteoarthritis of knees, bilateral POA: Yes    COPD exacerbation (HCC/RAF) POA: Yes    COPD (chronic obstructive pulmonary disease) (HCC/RAF) POA: Yes    Chronic prescription opiate use POA: Not Applicable    Diabetes (HCC/RAF) POA: Yes    PAD (peripheral artery disease) (HCC/RAF) POA: Yes    Hypertension POA: Yes    Hypercholesteremia POA: Yes    GERD (gastroesophageal reflux disease) POA: Yes    Pneumonia POA: Unknown    Lower extremity edema POA: Unknown    Memory loss POA: Unknown       Past Medical History:   Diagnosis Date   ??? Cancer (HCC/RAF)    ??? Diabetes (HCC/RAF)    ??? GERD (gastroesophageal reflux disease)    ??? Hypercholesteremia    ??? Hypertension    ??? PAD (peripheral artery disease) (HCC/RAF)    ??? 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 Prostate disease    ??? Scoliosis     adolesent   ??? Vascular disease     Past Surgical History:   Procedure Laterality Date   ??? BACK SURGERY     ??? BILATERAL FEMORAL ARTERY EXPLORATION  2009   ??? BLADDER SURGERY  1999   ??? PROSTATE SURGERY  2000         Anthropometrics     Admit  Height: 185.4 cm (6' 0.99'') (Stated)  Weight: (!) 103.4 kg (228 lb) Most Recent  Height: 185.4 cm (6' 0.99'') (Stated)  Weight: (!) 103.9 kg (229 lb)  IBW: 81 kg (178 lb 9.2 oz)  Usual Weight: 113.4 kg (250 lb)           BMI (Calculated): 30.3    % Ideal Body Weight: 128 %  % Usual Weight: 92 %        Allergies   Patient has no known allergies.     Cultural / Religious / Ethnic Food Preferences   None       Nutrition Prior to Admission   Per pt, follows a low carb diet at home.     Nutrition Risk Factors Moderate Nutrition Risk Factors:  (COPD exacerbation )     Acuity Level: 2-Moderate risk     Diet Orders     Diets/Supplements/Feeds   Diet    Diet carbohydrate controlled 2gm NA     Start Date/Time: 02/01/17 1020     Number of Occurrences:  Until Specified        Impression   PO % consumed: 76 to 100%  Impression: Diet tolerated well with good intake (Denies nausea / emesis. Pt is obese-class 1, BMI 30.3 .)     Diet Education   Pt declines, Pt previously educated      FDI Target Drugs: No          Nutrition Care Plan   Plan: Continue with diet as ordered, Monitor adequacy of intake, Trend weights       Next Follow-up by 02/12/17    Author:  Bernette Mayers, DTR, pager [phone]  02/05/2017 3:23 PM

## 2017-02-05 NOTE — Telephone Encounter
Confirmed 1: 12/17 @ 4:20 pm (PCP) appointment(s) and demographics with patient in-room and also provided appointment letters.

## 2017-02-05 NOTE — Other
Patients Clinical Goal:   Clinical Goal(s) for the Shift: VSS/safety/nsr on monitor/pain mgmt  Identify possible barriers to advancing the care plan: *none  Stability of the patient: Moderately Unstable - medium risk of patient condition declining or worsening    End of Shift Summary: *Recieved pt via bed from ER with cardiac monitor, nasal o2, RN and escort in attendance.  Pt stated he wanted to eat his dinner that had just arrived before his transfer to [facility].  Appetite excellent, no aspiration issues.  VSS with NSR on monitor.  Nasal o2 continues at 2 liters to keep sats >93%.  Lung fields are diminished bilaterally, with rhonchi and crackles to left side. Pt is alert, cooperative, who requires supervision with oob activity for safety. HS accucheck was 268 with NISS given.  Pt voiding, no stool tonight.  Hep lock secure to left forearm.  Pt medicated x1 with 2 tabs of norco for generalized pain 7/10 decreasing to 4/10.  Pt slept well after norco. Safety maintained.

## 2017-02-05 NOTE — Consults
Per Med team, plan for d/c home today.     CASE MANAGER ASSESSMENT      Admit [id number]    Date of Initial CM Assessment: 02/05/2017    Problems: Active Problems:    Lower back pain POA: Yes    OA (osteoarthritis) of knee POA: Yes    Primary osteoarthritis of knees, bilateral POA: Yes    COPD exacerbation (HCC/RAF) POA: Yes    COPD (chronic obstructive pulmonary disease) (HCC/RAF) POA: Yes    Chronic prescription opiate use POA: Not Applicable    Diabetes (HCC/RAF) POA: Yes    PAD (peripheral artery disease) (HCC/RAF) POA: Yes    Hypertension POA: Yes    Hypercholesteremia POA: Yes    GERD (gastroesophageal reflux disease) POA: Yes    Pneumonia POA: Unknown    Lower extremity edema POA: Unknown    Memory loss POA: Unknown       Past Medical History:   Diagnosis Date   ??? Cancer (HCC/RAF)    ??? Diabetes (HCC/RAF)    ??? GERD (gastroesophageal reflux disease)    ??? Hypercholesteremia    ??? Hypertension    ??? PAD (peripheral artery disease) (HCC/RAF)    ??? 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 Prostate disease    ??? Scoliosis     adolesent   ??? Vascular disease     Past Surgical History:   Procedure Laterality Date   ??? BACK SURGERY     ??? BILATERAL FEMORAL ARTERY EXPLORATION  2009   ??? BLADDER SURGERY  1999   ??? PROSTATE SURGERY  2000          Primary Care Physician:Azad, Ree Kida, MD  [phone]      NEEDS ASSESSMENT:     Level of Function Prior to Admit: Self Care/Indep. W ADLs    Current Living Situation: Lives w/Capable Spouse    Support Systems: Spouse/significant other, Children    Hours of Assistance/Day: as needed     Contact Name: Oryan, [place] Phone Number: [phone]/ [phone]       Living Accommodation: Home/Apartment, 1-Story   Bathroom on Main Floor: Yes  Stairs in Home: 0           Home Health Services: No                Other Therapies Prior to Admit: None             Rental DME/O2 in Home: Yes        Type of Rental Equipment: Oxygen          DISCHARGE ASSESSMENT: Projected Date of Discharge: 02/06/2017    Anticipated Complex D/C?: No     Projected Discharge to: Home    Projected Discharge Needs: Home Health, Agency Referral    Aidin Choice List: Provided to Pt/Family Date Provided: 02/05/17   Freedom of Choice: Educated and Provided, Home Health Agency       Support Identified at Discharge: Spouse  Name of Support Person: [place]holas, [place] Phone Number: [phone]     Who is available to transport you upon discharge?: Family Transportation        Met with pt at bedside, introduced self and role.   Informed pt CM will assist with [facility] coordination.          Herbie Baltimore,  02/05/2017

## 2017-02-05 NOTE — Nursing Note
Lying in bed in no apparent distress, able to make needs known; will monitor.

## 2017-02-06 ENCOUNTER — Telehealth: Payer: MEDICARE

## 2017-02-06 ENCOUNTER — Telehealth: Payer: PRIVATE HEALTH INSURANCE

## 2017-02-06 DIAGNOSIS — B449 Aspergillosis, unspecified: Secondary | ICD-10-CM

## 2017-02-06 DIAGNOSIS — A6001 Herpesviral infection of penis: Secondary | ICD-10-CM

## 2017-02-06 DIAGNOSIS — J189 Pneumonia, unspecified organism: Secondary | ICD-10-CM

## 2017-02-06 LAB — Immunoglobulin E: IGE: 77 k[IU]/L

## 2017-02-06 LAB — Glucose,POC
GLUCOSE,POC: 198 mg/dL — ABNORMAL HIGH (ref 65–99)
GLUCOSE,POC: 208 mg/dL — ABNORMAL HIGH (ref 65–99)
GLUCOSE,POC: 201 mg/dL — ABNORMAL HIGH (ref 65–99)

## 2017-02-06 LAB — CBC: NUCLEATED RBC%, AUTOMATED: 0 (ref 79.3–98.6)

## 2017-02-06 LAB — Basic Metabolic Panel
UREA NITROGEN: 23 mg/dL — ABNORMAL HIGH (ref 7–22)
UREA NITROGEN: 23 mg/dL — ABNORMAL HIGH (ref 7–22)

## 2017-02-06 LAB — HSV Type 1 and 2 by PCR: HSV TYPE 1 DNA: NOT DETECTED

## 2017-02-06 LAB — Bacterial Culture Respiratory

## 2017-02-06 LAB — Bacterial Culture-Gm Stain

## 2017-02-06 LAB — Hepatic Funct Panel: ALANINE AMINOTRANSFERASE: 26 U/L (ref 8–64)

## 2017-02-06 LAB — Magnesium: MAGNESIUM: 1.5 meq/L (ref 1.4–1.9)

## 2017-02-06 LAB — HIV-1/2 Ag/Ab 4th Generation with Reflex Confirmation: HIV-1/2 AG/AB 4TH GENERATION WITH REFLEX CONFIRMATION: NONREACTIVE

## 2017-02-06 LAB — MRSA Surveillance

## 2017-02-06 LAB — Differential Automated: IMMATURE GRANULOCYTES%: 1 (ref 0.00–0.04)

## 2017-02-06 MED ORDER — ACYCLOVIR 800 MG PO TABS
800 mg | ORAL_TABLET | Freq: Two times a day (BID) | ORAL | 0 refills | 4.00 days | Status: AC
Start: 2017-02-06 — End: 2017-02-17
  Filled 2017-02-22: qty 8, 4d supply, fill #0

## 2017-02-06 MED ORDER — LEVOFLOXACIN 750 MG PO TABS
750 mg | ORAL_TABLET | Freq: Every day | ORAL | 0 refills | 2.00 days | Status: AC
Start: 2017-02-06 — End: 2017-02-22

## 2017-02-06 MED ADMIN — TIOTROPIUM BROMIDE MONOHYDRATE 18 MCG IN CAPS: 18 ug | RESPIRATORY_TRACT | @ 18:00:00 | Stop: 2017-02-09 | NDC 00597007575

## 2017-02-06 MED ADMIN — ALBUTEROL SULFATE (2.5 MG/3ML) 0.083% IN NEBU: 2.5 mg | RESPIRATORY_TRACT | @ 02:00:00 | Stop: 2017-02-06 | NDC 00487950101

## 2017-02-06 MED ADMIN — INSULIN ASPART 100 UNIT/ML SC SOPN: 3 mL | SUBCUTANEOUS | @ 07:00:00 | Stop: 2017-02-22 | NDC 00169633910

## 2017-02-06 MED ADMIN — BUSPIRONE HCL 5 MG PO TABS: 15 mg | ORAL | @ 05:00:00 | Stop: 2017-02-21 | NDC 16729020001

## 2017-02-06 MED ADMIN — PANTOPRAZOLE SODIUM 40 MG PO TBEC: 40 mg | ORAL | @ 18:00:00 | Stop: 2017-02-22 | NDC 68084081309

## 2017-02-06 MED ADMIN — BUDESONIDE-FORMOTEROL FUMARATE 160-4.5 MCG/ACT IN AERO: 2 | RESPIRATORY_TRACT | @ 18:00:00 | Stop: 2017-02-22 | NDC 00186037028

## 2017-02-06 MED ADMIN — POLYETHYLENE GLYCOL 3350 17 G PO PACK: 17 g | ORAL | @ 18:00:00 | Stop: 2017-02-22 | NDC 68084043098

## 2017-02-06 MED ADMIN — HYDROCODONE-ACETAMINOPHEN 5-325 MG PO TABS: 2 | ORAL | @ 14:00:00 | Stop: 2017-02-22 | NDC 00406012362

## 2017-02-06 MED ADMIN — PRAVASTATIN SODIUM 40 MG PO TABS: 40 mg | ORAL | @ 05:00:00 | Stop: 2017-02-22 | NDC 51079078220

## 2017-02-06 MED ADMIN — INSULIN ASPART 100 UNIT/ML SC SOPN: 3 mL | SUBCUTANEOUS | @ 03:00:00 | Stop: 2017-02-22 | NDC 00169633910

## 2017-02-06 MED ADMIN — APIXABAN 5 MG PO TABS: 5 mg | ORAL | @ 05:00:00 | Stop: 2017-02-08 | NDC 00003089431

## 2017-02-06 MED ADMIN — BUSPIRONE HCL 5 MG PO TABS: 15 mg | ORAL | @ 18:00:00 | Stop: 2017-02-21 | NDC 16729020001

## 2017-02-06 MED ADMIN — CARVEDILOL 25 MG PO TABS: 25 mg | ORAL | @ 05:00:00 | Stop: 2017-02-09 | NDC 00904630361

## 2017-02-06 MED ADMIN — BUDESONIDE-FORMOTEROL FUMARATE 160-4.5 MCG/ACT IN AERO: 2 | RESPIRATORY_TRACT | @ 06:00:00 | Stop: 2017-02-22 | NDC 00186037028

## 2017-02-06 MED ADMIN — HYDROCODONE-ACETAMINOPHEN 5-325 MG PO TABS: 2 | ORAL | @ 03:00:00 | Stop: 2017-02-22 | NDC 00406012362

## 2017-02-06 MED ADMIN — LEVOFLOXACIN 750 MG PO TABS: 750 mg | ORAL | @ 18:00:00 | Stop: 2017-02-07 | NDC 68084048301

## 2017-02-06 MED ADMIN — INSULIN ASPART 100 UNIT/ML SC SOPN: 3 mL | SUBCUTANEOUS | @ 20:00:00 | Stop: 2017-02-22 | NDC 00169633910

## 2017-02-06 MED ADMIN — APIXABAN 5 MG PO TABS: 5 mg | ORAL | @ 18:00:00 | Stop: 2017-02-08 | NDC 00003089431

## 2017-02-06 MED ADMIN — CARVEDILOL 25 MG PO TABS: 25 mg | ORAL | @ 18:00:00 | Stop: 2017-02-09 | NDC 00904630361

## 2017-02-06 MED ADMIN — DOCUSATE SODIUM 100 MG PO CAPS: 100 mg | ORAL | @ 18:00:00 | Stop: 2017-02-22 | NDC 00904645561

## 2017-02-06 MED ADMIN — FUROSEMIDE 40 MG PO TABS: 40 mg | ORAL | @ 18:00:00 | Stop: 2017-02-09 | NDC 51079007320

## 2017-02-06 MED ADMIN — ACYCLOVIR 400 MG PO TABS: 400 mg | ORAL | @ 14:00:00 | Stop: 2017-02-10 | NDC 00904579061

## 2017-02-06 MED ADMIN — DULOXETINE HCL 60 MG PO CPEP: 60 mg | ORAL | @ 18:00:00 | Stop: 2017-02-22 | NDC 68084067521

## 2017-02-06 MED ADMIN — GABAPENTIN 400 MG PO CAPS: 800 mg | ORAL | @ 20:00:00 | Stop: 2017-02-22 | NDC 68084077401

## 2017-02-06 MED ADMIN — DOCUSATE SODIUM 100 MG PO CAPS: 100 mg | ORAL | @ 05:00:00 | Stop: 2017-02-22 | NDC 00904645561

## 2017-02-06 MED ADMIN — PYRIDOXINE HCL 50 MG PO TABS: 100 mg | ORAL | @ 18:00:00 | Stop: 2017-02-22 | NDC 00536440801

## 2017-02-06 MED ADMIN — ALBUTEROL SULFATE (2.5 MG/3ML) 0.083% IN NEBU: 2.5 mg | RESPIRATORY_TRACT | @ 14:00:00 | Stop: 2017-02-06 | NDC 00487950101

## 2017-02-06 MED ADMIN — LOSARTAN POTASSIUM 50 MG PO TABS: 100 mg | ORAL | @ 18:00:00 | Stop: 2017-02-09 | NDC 68084034701

## 2017-02-06 MED ADMIN — GABAPENTIN 400 MG PO CAPS: 800 mg | ORAL | @ 05:00:00 | Stop: 2017-02-22 | NDC 68084077401

## 2017-02-06 MED ADMIN — ALBUTEROL SULFATE (2.5 MG/3ML) 0.083% IN NEBU: 2.5 mg | RESPIRATORY_TRACT | @ 07:00:00 | Stop: 2017-02-06 | NDC 00487950101

## 2017-02-06 MED ADMIN — ACYCLOVIR 400 MG PO TABS: 400 mg | ORAL | @ 20:00:00 | Stop: 2017-02-10 | NDC 00904579061

## 2017-02-06 MED ADMIN — HYDROCODONE-ACETAMINOPHEN 5-325 MG PO TABS: 2 | ORAL | @ 20:00:00 | Stop: 2017-02-22 | NDC 00406012362

## 2017-02-06 MED ADMIN — GABAPENTIN 400 MG PO CAPS: 800 mg | ORAL | @ 14:00:00 | Stop: 2017-02-22 | NDC 68084077401

## 2017-02-06 MED ADMIN — HYDROCODONE-ACETAMINOPHEN 5-325 MG PO TABS: 2 | ORAL | @ 08:00:00 | Stop: 2017-02-22 | NDC 00406012362

## 2017-02-06 MED ADMIN — CLOPIDOGREL BISULFATE 75 MG PO TABS: 75 mg | ORAL | @ 18:00:00 | Stop: 2017-02-13 | NDC 00904629461

## 2017-02-06 NOTE — Other
Patients Clinical Goal:   Clinical Goal(s) for the Shift: Safety precautions and pain/nausea control.  Identify possible barriers to advancing the care plan: None  Stability of the patient: Moderately Stable - low risk of patient condition declining or worsening   End of Shift Summary: 76 yo m; vss; safety precautions observed; no c/o nausea; c/o pain treated with prescribed medications; wife in for visit today; Plan: discharge tomorrow if stable; will endorse to on coming nurse.

## 2017-02-06 NOTE — Telephone Encounter
Confirmed 1: 12/20 @ 9:20 am (PULM) appointment(s) and demographics with patient in-room and also provided appointment letters.

## 2017-02-06 NOTE — Telephone Encounter
Embry, Huss [id number] is scheduled for     Pulmonology   Dr. Desma Maxim on 12/20 @ 9:20 am (per [facility])  [address], [place]   [place], [place]     Navigated to patient in room 7159. Will also mail appt confirmation letter.

## 2017-02-06 NOTE — Consults
INPATIENT INFECTIOUS DISEASE CONSULTATION    PATIENT:  Ryan Shepard  MRN:  [id number]  DOB:  [birth year]  DATE OF ADMISSION: 02/01/2017    DATE OF SERVICE:  02/06/2017  REQUESTING PHYSICIAN: Internal Medicine  REASON FOR CONSULTATION: Aspergillus     Chief Complaint:  SOB    History of Present Illness:   Ryan Shepard is a 76 y/o M with COPD, tobacco use disorder, PAD s/p femoral graft, HTN, DM2, HLD, Afib, GERD, depression, anxiety, chronic pain presenting for intermittent SOB.     12/6: Admitted with a COPD exacerbation in the setting of diagnosed COPD and extensive tobacco use disorder. CXR on admission showing confluent airspace opacities c/f multifocal aspiration/pneumonia. Procal neg. Treated with levaquin (12/6- ) x 7 days and 5 day course of prednisone.   12/11: Patient was planned for discharge but then was found on bacterial resp culture from admission (12/6) to have aspergillus species, few probably candida. Other micro including legionella urine ag neg, MRSA nares neg, HIV neg. Had CT chest done showing airspace and nodular consolidation within the left upper lobe and lingula and lesser within the right upper and right lower lobes consistent with pneumonia, likely fungal. Also with bulky intrathoracic multicompartamental lymphadenopathy.     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 Started on acyclovir 800 mg BID x 5 days. Gonorrhea/chlamydia neg. RPR pending. Bacterial culture no growth to date. HIV neg.     Past Medical History:   Past Medical History:   Diagnosis Date   ??? Cancer (HCC/RAF)    ??? Diabetes (HCC/RAF)    ??? GERD (gastroesophageal reflux disease)    ??? Hypercholesteremia    ??? Hypertension    ??? PAD (peripheral artery disease) (HCC/RAF)    ??? 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 Prostate disease    ??? Scoliosis     adolesent   ??? Vascular disease Past Surgical History:  Past Surgical History:   Procedure Laterality Date   ??? BACK SURGERY     ??? BILATERAL FEMORAL ARTERY EXPLORATION  2009   ??? BLADDER SURGERY  1999   ??? PROSTATE SURGERY  2000       Allergies:   No Known Allergies    ID Medications:   Anti-infective Meds       Disp Refills Start End    acyclovir 800 mg tablet 8 tablet 0 02/05/2017     Sig - Route: Take 1 tablet (800 mg total) by mouth two (2) times daily. - Oral    Cosign for Ordering: Accepted by Pura Spice., MD on 02/05/2017  8:46 PM    levoFLOXacin 750 mg tablet 2 tablet 0 02/06/2017 02/07/2017    Sig - Route: Take 1 tablet (750 mg total) by mouth daily for 1 day. - Oral    Cosign for Ordering: Accepted by Pura Spice., MD on 02/05/2017  8:46 PM      Hospital Medications       Dose Frequency Start End    acyclovir tab 400 mg 400 mg 3 times daily 02/06/2017 02/13/2017    Sig - Route: Take 1 tablet (400 mg total) by mouth three (3) times daily. - Oral    levoFLOXacin tab 750 mg 750 mg Daily 02/01/2017 02/08/2017    Sig - Route: Take 1 tablet (750 mg total) by mouth  daily. - Oral        Family History:  Family History   Problem Relation Age of Onset   ??? Cancer Mother    ??? Cancer Father    ??? Malignant hyperthermia Neg Hx        Social History:  Social History     Social History   ??? Marital status: Married     Spouse name: N/A   ??? Number of children: N/A   ??? Years of education: N/A     Social History Main Topics   ??? Smoking status: Never Smoker   ??? Smokeless tobacco: Never Used   ??? Alcohol use 0.0 oz/week   ??? Drug use: No   ??? Sexual activity: Not Asked     Other Topics Concern   ??? None     Social History Narrative   ??? None       Review of Systems:  A complete 14-point review of systems was negative except for as noted above.     Objective:  Vital Signs summary (past 24 hours):  Temp:  [36.4 ???C (97.5 ???F)-36.9 ???C (98.4 ???F)] 36.4 ???C (97.5 ???F)  Heart Rate:  [64-84] 73  Resp:  [16-22] 18  BP: (124-162)/(56-74) 129/60  NBP Mean:  [78-96] 80 SpO2:  [87 %-94 %] 87 %  Temp (24hrs), Avg:36.6 ???C (97.8 ???F), Min:36.4 ???C (97.5 ???F), Max:36.9 ???C (98.4 ???F)    Intake and Output:   Last Two Completed Shifts:  I/O last 2 completed shifts:  In: 1490 [P.O.:1490]  Out: 2950 [Urine:2950]    Physical Exam:   Gen: no distress lying in bed on nasal O2, desaturates when O2 falls off  HEENT: PERRL, EOMI  CV: rrr, nl s1/s2, no murmurs  Pulmonary: crackles at right lower base, scattered wheezes and tubular breath sounds  GI: soft, BS+, not tender to light palpation, no masses/organomegaly  MSK: no c/c/e  Skin: no rashes, normal turgor  Uro: multiple tender vesicles on the penial foreskin, uncircumsized  Neuro: awake, alert, moving all extremities  Psych: appropriate mood/affect, appropriate judgment/insight    Lab Tests/ Studies:  Recent Labs      02/06/17   0531  02/05/17   0648  02/04/17   0417   WBC  9.77  10.13*  11.23*   HGB  14.0  13.8  13.5   HCT  44.1  43.0  40.9   MCV  88.2  88.1  89.9   PLT  122*  129*  141*       Recent Labs      02/06/17   0531  02/05/17   0648  02/04/17   0417   NA  138  138  135   K  5.2  4.5  4.3   CL  98  96  95*   CO2  31*  33*  31*   BUN  23*  14  18   CREAT  1.01  0.80  0.87   CALCIUM  9.2  9.3  8.8   MG  1.5  1.7  1.6       Recent Labs      02/06/17   0531   TOTPRO  6.1   ALBUMIN  3.2*   BILITOT  0.3   BILICON  <0.2   ALT  26   AST  27   ALKPHOS  51       No results found for: Paulo Fruit, GENTPEAK, GENTRAND, GENTTROUGH, AMIKPEAK, AMIKTROUGH, [place], TOBRARAND  Microbiology:   Aspergillus Ag EIA - IP   Aspergillus fumigatus (M3) IgE - IP   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+  Bacterial culture gram stain from vesicle - Coag neg staph like colonies, few enteroccocus like colonies  Cryptococcal Ag - pending  Cocci EIA/Ag - IP   12/6 Sputum few Candida and Aspergillus species, few normal flora  MRSA screen negative    HIV  - neg  Legionella Urinary Ag - neg  Chlamydia/gonorrhea PCR - neg     Imaging reviewed by me: CT chest w/o contrast 12/10 reviewed with staff radiologist  IMPRESSION:  Atherosclerotic calcification of the thoracic aorta, brachiocephalic, bilateral common carotid, bilateral subclavian and bilateral axillary, left anterior descending, circumflex and right coronary arteries. Enlargement of the central pulmonary arteries, the main pulmonary artery currently 39 mm in maximal axial diameter, with associated calcifications, consistent with pulmonary arterial hypertension. Small left pleural effusion. Small pericardial effusion.  Moderate smoking-related centrilobular emphysema.  Coalescent airspace and nodular consolidation within the left upper lobe and lingula, and to a lesser extent, within the right upper and right lower lobes, consistent with pneumonia, likely fungal. Associated bulky intrathoracic multicompartmental   lymphadenopathy; for example, AP window lymph node, currently 38 x 23 mm (3-40).    Assessment:  Ryan Santilli is a 76 y/o M with COPD, tobacco use d/o, PAD presenting with SOB and penile lesions.    #Aspergillus on resp cultures 12/6  #Vesciluar Penile lesions, HSV2+  #COPD Exacerbation   #Afib  #PAD  #HTN  #HLD  #GERD  #DM2    There is some data to suggest invasive aspergillosis that can occur in less immunosuppressed hosts in the setting of underlying COPD receiving steroid therapy. Pulmonary aspergillosis manifests as single or multiple nodules with or without cavitation, patchy or segmental consolidation or peribronchial infiltrates although more often consolidation and ground glass infiltrates may be more commonly seen. In the setting of these consolidations and intrathoracic lymphadenopathy, further workup is indicated to evaluate for other atypical organisms including fungal and mycobacterial causes. Malignancy is also a consideration given bulky lymphadenopathy and tobacco use history.     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    Author:  Dub Amis, MD 02/06/2017 12:14 PM  Internal Medicine, PGY-2    DWA Dr Charmian Muff   Patient seen and examined by me with Dr. Laveda Norman.  We reviewed the laboratory and radiology data and formulated the treatment plan together.  Lenon Curt, MD

## 2017-02-06 NOTE — Other
Patients Clinical Goal:   Clinical Goal(s) for the Shift: Safety precautions and pain/nausea control.  Identify possible barriers to advancing the care plan:   Stability of the patient: Moderately Unstable - medium risk of patient condition declining or worsening    End of Shift Summary: 76 yo pleasant male, calm and cooperative. Pt is jovial with nursing staffs. Pt c/o generalized pain;  relieved with long term home analgescis (Norco 2tabs q6h), given x2 on this shift. CT chest w/o done at bedtime. Sats 94% on 3L, and desats down to 77% off supplemental O2. Slept well overnight. Uses urinal to void clear/yellow urine noted. Good PO intake. Glucose level at bedtime = 208, covered according to sliding scale. No new complaint. D/C teaching initiated. Pt feels ready to be D/C'd home. Wife is at bedside. All needs attended to. Pt is in NAD. PLAN: D/C planing in place following review of CT chest result. Pt is now acylcovir which he will go home with, possible today. Will continue to monitor.

## 2017-02-06 NOTE — Consults
Per Dr. Encarnacion Slates, pt noted with aspergillus pending final treatment plan. Possible d/c home today, HH has already been secured.     FINAL DISCHARGE MULTIDISCIPLINARY NOTE  Department of Care Coordination      Admit [id number]  Anticipated Date of Discharge: 02/06/2017    Following GN:FAOZH, Carroll Kinds., MD    Home Address:[address]  [place] [address]      DISCHARGE INFORMATION:     Discharge Address: [address] [place] [address]    Individual(s) notified of discharge plan:  Contact Name: Ryan Shepard, Ryan Shepard Relationship: Self   Contact Number(s): [phone]/ [phone]      Support Systems: Spouse       Medicare Important Message Provided: Yes       Aidin Choice List: Provided to Pt/Family Date Provided: 02/05/17   Freedom of Choice: Educated and Provided, Home Health Agency       Final Discharge Needs: Home Health Coordination, Home Durable Medical and/or Respiratory Therapy Equipment    Home Health Coordination (if applicable):   Agency Name: [facility]  Contact Person: Admissions via Aidin  Phone Number(s): [phone]  Fax Number: [phone]  Start of Care Date: 02/06/17  Services: Nursing, Physical Therapy  Comments: freedom of choice presented to pt, agreed with [facility]    Sent updates to [place] via [facility]     Home Durable Medical and/or Respiratory Equipment (if applicable):   Equipment Provided: N/A  Comments: Pt states he is already established with home oxygen - attempted to call wife to confirm vendor but wife was busy (works during day at pre-school she owns)       Herbie Baltimore,  02/06/2017

## 2017-02-06 NOTE — Other
Patients Clinical Goal:   Clinical Goal(s) for the Shift: pain less than 5/10, pt will maintain spO2 >92%, pt will  be sen by ID   Identify possible barriers to advancing the care plan: ID recs for aspergillus   Stability of the patient: Moderately Stable - low risk of patient condition declining or worsening   End of Shift Summary: Pt awake and oriented, in NAD. VSS. Pt on 3 L nasal cannula. pts pain goal is 5, he has chronic pain. Norco given for pain prn. Lasix given Po, goal is -500 ccs. Pt pending ID recs for aspergillus. Home health already secured per CM. Pt ambulated in the hallway with 3 L nc and he desatted to 81%. MD fireman aware. Pt sat back down and came back up after resting. Pt NPO p MN tonight for possible bronch on 12/12

## 2017-02-07 ENCOUNTER — Ambulatory Visit: Payer: PRIVATE HEALTH INSURANCE

## 2017-02-07 DIAGNOSIS — R911 Solitary pulmonary nodule: Secondary | ICD-10-CM

## 2017-02-07 LAB — RPR
RPR: NONREACTIVE
RPR: NONREACTIVE

## 2017-02-07 LAB — Glucose,POC
GLUCOSE,POC: 104 mg/dL — ABNORMAL HIGH (ref 65–99)
GLUCOSE,POC: 113 mg/dL — ABNORMAL HIGH (ref 65–99)
GLUCOSE,POC: 177 mg/dL — ABNORMAL HIGH (ref 65–99)
GLUCOSE,POC: 179 mg/dL — ABNORMAL HIGH (ref 65–99)

## 2017-02-07 LAB — Bacterial Culture Respiratory

## 2017-02-07 LAB — Basic Metabolic Panel
CALCIUM: 8.5 mg/dL — ABNORMAL LOW (ref 8.6–10.4)
POTASSIUM: 4.2 mmol/L (ref 3.6–5.3)

## 2017-02-07 LAB — Magnesium: MAGNESIUM: 1.6 meq/L (ref 1.4–1.9)

## 2017-02-07 LAB — Cryptococcus Antigen Blood: CRYPTOCOCCAL ANTIGEN, BLOOD: NEGATIVE

## 2017-02-07 LAB — Bacterial Culture-Gm Stain

## 2017-02-07 MED ADMIN — BUSPIRONE HCL 5 MG PO TABS: 15 mg | ORAL | @ 17:00:00 | Stop: 2017-02-21 | NDC 16729020001

## 2017-02-07 MED ADMIN — DOCUSATE SODIUM 100 MG PO CAPS: 100 mg | ORAL | @ 05:00:00 | Stop: 2017-02-22 | NDC 00904645561

## 2017-02-07 MED ADMIN — METFORMIN HCL 500 MG PO TABS: 1000 mg | ORAL | @ 17:00:00 | Stop: 2017-02-09 | NDC 00904668961

## 2017-02-07 MED ADMIN — APIXABAN 5 MG PO TABS: 5 mg | ORAL | @ 05:00:00 | Stop: 2017-02-08 | NDC 00003089431

## 2017-02-07 MED ADMIN — METFORMIN HCL 500 MG PO TABS: 1000 mg | ORAL | @ 05:00:00 | Stop: 2017-02-09 | NDC 00904668961

## 2017-02-07 MED ADMIN — LEVOFLOXACIN 750 MG PO TABS: 750 mg | ORAL | @ 17:00:00 | Stop: 2017-02-07 | NDC 68084048301

## 2017-02-07 MED ADMIN — PRAVASTATIN SODIUM 40 MG PO TABS: 40 mg | ORAL | @ 05:00:00 | Stop: 2017-02-22 | NDC 51079078220

## 2017-02-07 MED ADMIN — DULOXETINE HCL 60 MG PO CPEP: 60 mg | ORAL | @ 17:00:00 | Stop: 2017-02-22 | NDC 68084067521

## 2017-02-07 MED ADMIN — INSULIN ASPART 100 UNIT/ML SC SOPN: 3 mL | SUBCUTANEOUS | @ 02:00:00 | Stop: 2017-02-22 | NDC 00169633910

## 2017-02-07 MED ADMIN — TIOTROPIUM BROMIDE MONOHYDRATE 18 MCG IN CAPS: 18 ug | RESPIRATORY_TRACT | @ 18:00:00 | Stop: 2017-02-09 | NDC 00597007575

## 2017-02-07 MED ADMIN — ACYCLOVIR 400 MG PO TABS: 400 mg | ORAL | @ 14:00:00 | Stop: 2017-02-10 | NDC 00904579061

## 2017-02-07 MED ADMIN — APIXABAN 5 MG PO TABS: 5 mg | ORAL | @ 17:00:00 | Stop: 2017-02-08 | NDC 00003089431

## 2017-02-07 MED ADMIN — GABAPENTIN 400 MG PO CAPS: 800 mg | ORAL | @ 05:00:00 | Stop: 2017-02-22 | NDC 68084077401

## 2017-02-07 MED ADMIN — CARVEDILOL 25 MG PO TABS: 25 mg | ORAL | @ 17:00:00 | Stop: 2017-02-09 | NDC 00904630361

## 2017-02-07 MED ADMIN — DIPHENHYDRAMINE-ZINC ACETATE 1-0.1 % EX CREA: TOPICAL | @ 05:00:00 | Stop: 2017-02-22

## 2017-02-07 MED ADMIN — BUSPIRONE HCL 5 MG PO TABS: 15 mg | ORAL | @ 05:00:00 | Stop: 2017-02-21 | NDC 16729020001

## 2017-02-07 MED ADMIN — GABAPENTIN 400 MG PO CAPS: 800 mg | ORAL | @ 21:00:00 | Stop: 2017-02-22 | NDC 68084077401

## 2017-02-07 MED ADMIN — FUROSEMIDE 40 MG PO TABS: 40 mg | ORAL | @ 17:00:00 | Stop: 2017-02-09 | NDC 51079007320

## 2017-02-07 MED ADMIN — LOSARTAN POTASSIUM 50 MG PO TABS: 100 mg | ORAL | @ 17:00:00 | Stop: 2017-02-09 | NDC 68084034701

## 2017-02-07 MED ADMIN — POLYETHYLENE GLYCOL 3350 17 G PO PACK: 17 g | ORAL | @ 17:00:00 | Stop: 2017-02-22 | NDC 68084043098

## 2017-02-07 MED ADMIN — LORAZEPAM 1 MG PO TABS: 2 mg | ORAL | @ 06:00:00 | Stop: 2017-02-10 | NDC 69315090505

## 2017-02-07 MED ADMIN — INSULIN ASPART 100 UNIT/ML SC SOPN: 3 mL | SUBCUTANEOUS | @ 06:00:00 | Stop: 2017-02-22 | NDC 00169633910

## 2017-02-07 MED ADMIN — GABAPENTIN 400 MG PO CAPS: 800 mg | ORAL | @ 14:00:00 | Stop: 2017-02-22 | NDC 68084077401

## 2017-02-07 MED ADMIN — HYDROCODONE-ACETAMINOPHEN 5-325 MG PO TABS: 2 | ORAL | @ 07:00:00 | Stop: 2017-02-22 | NDC 00406012362

## 2017-02-07 MED ADMIN — CLOPIDOGREL BISULFATE 75 MG PO TABS: 75 mg | ORAL | @ 17:00:00 | Stop: 2017-02-13 | NDC 00904629461

## 2017-02-07 MED ADMIN — PANTOPRAZOLE SODIUM 40 MG PO TBEC: 40 mg | ORAL | @ 17:00:00 | Stop: 2017-02-22 | NDC 68084081309

## 2017-02-07 MED ADMIN — HYDROCODONE-ACETAMINOPHEN 5-325 MG PO TABS: 2 | ORAL | @ 14:00:00 | Stop: 2017-02-22 | NDC 00406012362

## 2017-02-07 MED ADMIN — HYDROCODONE-ACETAMINOPHEN 5-325 MG PO TABS: 2 | ORAL | @ 02:00:00 | Stop: 2017-02-22 | NDC 00406012362

## 2017-02-07 MED ADMIN — BUDESONIDE-FORMOTEROL FUMARATE 160-4.5 MCG/ACT IN AERO: 2 | RESPIRATORY_TRACT | @ 18:00:00 | Stop: 2017-02-22 | NDC 00186037028

## 2017-02-07 MED ADMIN — INSULIN ASPART 100 UNIT/ML SC SOPN: 3 mL | SUBCUTANEOUS | @ 18:00:00 | Stop: 2017-02-22 | NDC 00169633910

## 2017-02-07 MED ADMIN — ACYCLOVIR 400 MG PO TABS: 400 mg | ORAL | @ 21:00:00 | Stop: 2017-02-10 | NDC 00904579061

## 2017-02-07 MED ADMIN — DOCUSATE SODIUM 100 MG PO CAPS: 100 mg | ORAL | @ 17:00:00 | Stop: 2017-02-22 | NDC 00904645561

## 2017-02-07 MED ADMIN — CARVEDILOL 25 MG PO TABS: 25 mg | ORAL | @ 05:00:00 | Stop: 2017-02-09 | NDC 00904630361

## 2017-02-07 MED ADMIN — HYDROCODONE-ACETAMINOPHEN 5-325 MG PO TABS: 2 | ORAL | @ 21:00:00 | Stop: 2017-02-22 | NDC 00406012362

## 2017-02-07 MED ADMIN — ACYCLOVIR 400 MG PO TABS: 400 mg | ORAL | @ 05:00:00 | Stop: 2017-02-10 | NDC 00904579061

## 2017-02-07 MED ADMIN — PYRIDOXINE HCL 50 MG PO TABS: 100 mg | ORAL | @ 17:00:00 | Stop: 2017-02-22 | NDC 00536440801

## 2017-02-07 MED ADMIN — BUDESONIDE-FORMOTEROL FUMARATE 160-4.5 MCG/ACT IN AERO: 2 | RESPIRATORY_TRACT | @ 06:00:00 | Stop: 2017-02-22 | NDC 00186037028

## 2017-02-07 NOTE — Progress Notes
Internal Medicine Progress Note      PMD: Wilburn Mylar, MD  DATE OF SERVICE: 02/06/2017  HOSPITAL DAY: 5  CHIEF COMPLAINT:   Chief Complaint   Patient presents with   ??? Shortness of Breath     dx with pneumonia at OSH on antibiotics with clear withe phelgm Denies fever.        Last 24 Hour/Overnight Events   - no acute events    Subjective/Review of Systems   - no acute shortness of breath, hemoptysis, or chest pain    Medications     Scheduled:    acyclovir 400 mg Oral TID   apixaban 5 mg Oral BID   budesonide-formoterol 2 puff Inhalation BID   busPIRone 15 mg Oral BID   carvedilol 25 mg Oral BID   clopidogrel 75 mg Oral Daily   docusate 100 mg Oral BID   DULoxetine 60 mg Oral Daily   furosemide 40 mg Oral Daily   gabapentin 800 mg Oral TID   levoFLOXacin 750 mg Oral Daily   losartan 100 mg Oral Daily   metFORMIN 1,000 mg Oral BID w/meals   pantoprazole 40 mg Oral Daily   polyethylene glycol 17 g Oral Daily   pravastatin 40 mg Oral QHS   pyridoxine 100 mg Oral Daily   tiotropium 18 mcg Inhalation Daily      Continuous:     PRN:  albuterol, insulin aspart **AND** dextrose, diphenhydrAMINE, HYDROcodone-acetaminophen, LORazepam, senna      Physical Exam   Temp:  [36.4 ???C (97.5 ???F)-36.9 ???C (98.4 ???F)] 36.4 ???C (97.5 ???F)  Heart Rate:  [60-84] 60  Resp:  [16-18] 18  BP: (129-162)/(56-74) 129/60  NBP Mean:  [80-96] 80  SpO2:  [81 %-93 %] 90 %   UOP: Urine:  [200 mL-950 mL] 350 mL I/Os:     Intake/Output Summary (Last 24 hours) at 02/06/17 1946  Last data filed at 02/06/17 1843   Gross per 24 hour   Intake             1130 ml   Output             2950 ml   Net            -1820 ml     General:  well-developed???male???in no acute distress, appears comfortabe   Eyes: PERRL, EOMI  HEENT: OP clear, MMM, NC in place (3L)  CV: RRR, normal S1S2, no m/r/g  Pulm: improved crackles bilaterally, mild audible wheezing. No rhonchi.   Abd: NABS, soft, flat, NT, no masses or organomegaly  Skin: Warm and dry, no rashes  Ext: trace edema Neuro: A&Ox3, CNII-XII intact, motor 5/5 throughout, sensation grossly intact    Laboratory Data   CBC:  Recent Labs      02/06/17   0531  02/05/17   0648  02/04/17   0417   WBC  9.77  10.13*  11.23*   NEUTABS  7.08*  7.67*  8.98*   HGB  14.0  13.8  13.5   HCT  44.1  43.0  40.9   MCV  88.2  88.1  89.9   PLT  122*  129*  141*     BMP:  Recent Labs      02/06/17   0531  02/05/17   0648  02/04/17   0417   NA  138  138  135   K  5.2  4.5  4.3   CL  98  96  95*   CO2  31*  33*  31*   BUN  23*  14  18   CREAT  1.01  0.80  0.87   GLUCOSE  146*  145*  216*   CALCIUM  9.2  9.3  8.8   MG  1.5  1.7  1.6     UA:  Recent Labs      02/04/17   1256   SPECGRAVUR  1.010   PHUR  6.0   BLDUR  Negative   KETONESUR  Negative   GLUCOSEUR  2+*   PROTCLUR  Negative   NITRITEUR  Negative   LEUKESTUR  Trace*   RBCSUR  0   WBCSUR  9     Results for Ryan Shepard (MRN [id number]) as of 02/05/2017 07:05    02/04/2017 12:56  Urine Color: Straw  pH,Urine: 6.0  Specific Gravity: 1.010  Blood,Dipstick: Negative  Bili,Dipstick: Negative  Glucose,Random Urine: 2+ (A)  Ketones: Negative  Protein: Negative  Nitrite: Negative  Leukocyte Esterase: Trace (A)  RBC per uL: 0  WBC per uL: 9  Squamous Epi Cells: 1  Hyaline Casts: 0-2/LPF  RBC per HPF: 0  WBC per HPF: 2    Microbiology   Bacterial Culture Respiratory 02/01/17: Few Probable candida albicans , Aspergillus species    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   Sputum screen 02/01/17: <10 Epithelial cells per low power field   Resp bacterial Cx 02/01/17: <10 Epithelial cells per low power field   Legionella urine 02/01/17: negative    Imaging   12/10 CT chest  Coalescent airspace and nodular consolidation within the left upper lobe and lingula, and to a lesser extent, within the right upper and right lower lobes, consistent with pneumonia, likely fungal. Associated bulky intrathoracic multicompartmental   lymphadenopathy; for example, AP window lymph node, currently 38 x 23 mm (3-40).    Assessment/Plan   Ryan Shepard???is a 76 y.o.???male???with COPD, hx of tobacco use disorder, PAD s/p femoral graft, HTN, DM2, HLD, A.fib, GERD, depression, anxiety, chronic pain, who presents with intermittent SOB and LE edema???for the past week. Patient has polypharmacy and needs good outpatient med reconciliation. Patient would like to transfer his care to [facility] and be set up with a new PCP.     #Pneumonia: positive aspirgillus on resp cx and characteristics of consilidation on CT raises concern for fungal pneumonia.   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 Patient has an extensive tobacco use history with dx COPD and was recently admitted to OSH in [place] for PNA (undertreated augmentin not great for atypicals)  CXR 02/01/17 revealed increasing diffuse confluent airspace opacities within the  left suprahilar region and perihilar region with interstitial prominence throughout the left lung and to lesser extent right lung concerning for multifocal aspiration/pneumonia. Aortic calcification tortuosity with cardiomegaly.  procalcitonin <0.1. Weight downtrending. PE: crackles bilaterally, 3+ LE edema, Bedside U/S revealed IVC of 2.7 cm, non-collapsable and RIJ ~ 6-7 cm, non-collapsable   - TTE 02/01/17 revealed grade 1 diastolic dysfunction, LVEF 70-75%  - strict ins/outs   - daily weights (103.4 kg on admission)  - continue PO Lasix 40 mg daily   - continue PO levofloxacin 750 mg daily, plan for 7-day course last day 12/12  - keep SpO2 >???88 (on NC --> okay to use face mask)  - symbicort 2 puffs BID with teaching   - s/p Prednisone 40 mg PO daily x 5 days (12/6-12/10) last day today    #hx of Afib  - continuous cardiac monitoring   - d/c digoxin 250 mcg daily   - continue home carvedilol 25 mg daily ???BID   - continue home apixaban 5 mg BID  ???  #PAD: chronic/stable  PAD s/p femoral-femoral graft (2009)  - continue clopidogrel 75 mg daily     #HTN chronic/stable  SBP 130s-170s, on home losartan-HCTZ (non-formulary), Cr stable  - continue losartan 100 mg daily   ???  #HLD chronic/stable  - continue home pravastatin 40 mg nightly   ???  #GERD  - continue pantoprazole 40 mg daily   ???  #DM2 chronic/stable  - resume home metformin  - accuchecks (GLC 125-240), patient on prednisone  - insulin sliding scale algorithm #3  - continue home gabapentin 800 mg ???TID   ???  #depression   #anxiety  - continue buspirone 15 mg BID  - continue home duloxetine DR 60   - continue home lorazepam 2 mg PRN BID  ???  #chornic pain  Per patient ongoing lower back and lower extremity pain for several years  - continue home Norco 10-325 mg q6h PRN   - schedule with outpatient [facility] management at [facility]   ???  Inpatient Checklist:  FEN/GI:???  Diet: Diet carbohydrate controlled 2gm NA  Analgesia:???Norco  Sedation:???N/A  DVT Prophylaxis: on home apixaban and clopidogrel   GI Prophylaxis: on PPI  Glycemic control:???insulin aspart algorithm #2  Bowel regimen:???NA  ???  ???  Code Status: Full Code  Contact: ???Primary Emergency Contact: Ryan Shepard, Ryan Shepard (wife), Home Phone: [phone]  ???  DWA Vertis Kelch., MD    ???  Author: Lyman Speller Encarnacion Slates

## 2017-02-07 NOTE — Consults
INPATIENT INFECTIOUS DISEASE CONSULTATION    PATIENT:  Ryan Shepard  MRN:  [id number]  DOB:  [birth year]  DATE OF ADMISSION: 02/01/2017    DATE OF SERVICE:  02/07/2017  REQUESTING PHYSICIAN: Internal Medicine  REASON FOR CONSULTATION: Aspergillus     Chief Complaint:  SOB    History of Present Illness:   Mr Ryan Shepard is a 76 y/o M with COPD, tobacco use disorder, PAD s/p femoral graft, HTN, DM2, HLD, Afib, GERD, depression, anxiety, chronic pain presenting for intermittent SOB.     12/6: Admitted with a COPD exacerbation in the setting of diagnosed COPD and extensive tobacco use disorder. CXR on admission showing confluent airspace opacities c/f multifocal aspiration/pneumonia. Procal neg. Treated with levaquin (12/6- ) x 7 days and 5 day course of prednisone.   12/11: Patient was planned for discharge but then was found on bacterial resp culture from admission (12/6) to have aspergillus species, few probably candida. Other micro including legionella urine ag neg, MRSA nares neg, HIV neg. Had CT chest done showing airspace and nodular consolidation within the left upper lobe and lingula and lesser within the right upper and right lower lobes consistent with pneumonia, likely fungal. Also with bulky intrathoracic multicompartamental lymphadenopathy.     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 Started on acyclovir 800 mg BID x 5 days. Gonorrhea/chlamydia neg. RPR pending. Bacterial culture no growth to date. HIV neg.     12/12: VSS, no other acute changes. Requiring 3 L NC. No changes in respiratory status.     Past Medical History:   Past Medical History:   Diagnosis Date   ??? Cancer (HCC/RAF)    ??? Diabetes (HCC/RAF)    ??? GERD (gastroesophageal reflux disease)    ??? Hypercholesteremia    ??? Hypertension    ??? PAD (peripheral artery disease) (HCC/RAF)    ??? 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 Prostate disease    ??? Scoliosis     adolesent   ??? Vascular disease        Past Surgical History:  Past Surgical History:   Procedure Laterality Date   ??? BACK SURGERY     ??? BILATERAL FEMORAL ARTERY EXPLORATION  2009   ??? BLADDER SURGERY  1999   ??? PROSTATE SURGERY  2000       Allergies:   No Known Allergies    ID Medications:   Anti-infective Meds       Disp Refills Start End    acyclovir 800 mg tablet 8 tablet 0 02/05/2017     Sig - Route: Take 1 tablet (800 mg total) by mouth two (2) times daily. - Oral    Cosign for Ordering: Accepted by Pura Spice., MD on 02/05/2017  8:46 PM    levoFLOXacin 750 mg tablet 2 tablet 0 02/06/2017 02/07/2017    Sig - Route: Take 1 tablet (750 mg total) by mouth daily for 1 day. - Oral    Cosign for Ordering: Accepted by Pura Spice., MD on 02/05/2017  8:46 PM      Hospital Medications       Dose Frequency Start End    acyclovir tab 400 mg 400 mg 3 times daily 02/06/2017 02/13/2017    Sig - Route: Take 1 tablet (400 mg total) by mouth three (3) times daily. - Oral    levoFLOXacin  tab 750 mg 750 mg Daily 02/01/2017 02/08/2017    Sig - Route: Take 1 tablet (750 mg total) by mouth daily. - Oral        Family History:  Family History   Problem Relation Age of Onset   ??? Cancer Mother    ??? Cancer Father    ??? Malignant hyperthermia Neg Hx        Social History:  Social History     Social History   ??? Marital status: Married     Spouse name: N/A   ??? Number of children: N/A   ??? Years of education: N/A     Social History Main Topics   ??? Smoking status: Never Smoker   ??? Smokeless tobacco: Never Used   ??? Alcohol use 0.0 oz/week   ??? Drug use: No   ??? Sexual activity: Not Asked     Other Topics Concern   ??? None     Social History Narrative   ??? None       Review of Systems:  A complete 14-point review of systems was negative except for as noted above.     Objective:  Vital Signs summary (past 24 hours):  Temp:  [36 ???C (96.8 ???F)-36.8 ???C (98.2 ???F)] 36.8 ???C (98.2 ???F) Heart Rate:  [60-75] 67  Resp:  [18-20] 18  BP: (112-156)/(57-69) 156/64  NBP Mean:  [75-92] 89  SpO2:  [81 %-96 %] 93 %  Temp (24hrs), Avg:36.4 ???C (97.5 ???F), Min:36 ???C (96.8 ???F), Max:36.8 ???C (98.2 ???F)    Intake and Output:   Last Two Completed Shifts:  I/O last 2 completed shifts:  In: 840 [P.O.:840]  Out: 1300 [Urine:1300]    Physical Exam:   Gen: no distress lying in bed on nasal O2, desaturates when O2 falls off  HEENT: PERRL, EOMI  CV: normal S1/S2, no murmurs   Pulmonary: crackles at right lower base, scattered wheezes and tubular breath sounds  GI: soft, BS+, not tender to light palpation, no masses/organomegaly  Skin: no rashes, normal turgor  Uro: multiple tender vesicles on the penial foreskin, uncircumsized  Neuro: awake, alert, moving all extremities  Psych: appropriate mood/affect, appropriate judgment/insight    Lab Tests/ Studies:  Recent Labs      02/06/17   0531  02/05/17   0648   WBC  9.77  10.13*   HGB  14.0  13.8   HCT  44.1  43.0   MCV  88.2  88.1   PLT  122*  129*       Recent Labs      02/07/17   0545  02/06/17   0531  02/05/17   0648   NA  137  138  138   K  4.2  5.2  4.5   CL  95*  98  96   CO2  28  31*  33*   BUN  24*  23*  14   CREAT  0.92  1.01  0.80   CALCIUM  8.5*  9.2  9.3   MG  1.6  1.5  1.7       Recent Labs      02/06/17   0531   TOTPRO  6.1   ALBUMIN  3.2*   BILITOT  0.3   BILICON  <0.2   ALT  26   AST  27   ALKPHOS  51       No results found for: Paulo Fruit, [place], GENTRAND, GENTTROUGH, AMIKPEAK,  Alric Seton, TOBRARAND    Microbiology:   Aspergillus Ag EIA - IP   Aspergillus fumigatus (M3) IgE - IP   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+  Bacterial culture gram stain from vesicle - Coag neg staph like colonies, few enteroccocus like colonies  Cryptococcal Ag - neg  Cocci EIA/Ag - IP   12/6 Sputum few Candida and Aspergillus species, few normal flora  MRSA screen negative    HIV  - neg  Legionella Urinary Ag - neg  Chlamydia/gonorrhea PCR - neg Imaging reviewed by me:  CT chest w/o contrast 12/10 reviewed with staff radiologist  IMPRESSION:  Atherosclerotic calcification of the thoracic aorta, brachiocephalic, bilateral common carotid, bilateral subclavian and bilateral axillary, left anterior descending, circumflex and right coronary arteries. Enlargement of the central pulmonary arteries, the main pulmonary artery currently 39 mm in maximal axial diameter, with associated calcifications, consistent with pulmonary arterial hypertension. Small left pleural effusion. Small pericardial effusion.  Moderate smoking-related centrilobular emphysema.  Coalescent airspace and nodular consolidation within the left upper lobe and lingula, and to a lesser extent, within the right upper and right lower lobes, consistent with pneumonia, likely fungal. Associated bulky intrathoracic multicompartmental   lymphadenopathy; for example, AP window lymph node, currently 38 x 23 mm (3-40).    Assessment:  Mr Boswell is a 76 y/o M with COPD, tobacco use d/o, PAD presenting with SOB and penile lesions.    #Aspergillus on resp cultures 12/6  #Vesciluar Penile lesions, HSV2+  #COPD Exacerbation   #Afib  #PAD  #HTN  #HLD  #GERD  #DM2    There is some data to suggest invasive aspergillosis that can occur in less immunosuppressed hosts in the setting of underlying COPD receiving steroid therapy. Pulmonary aspergillosis manifests as single or multiple nodules with or without cavitation, patchy or segmental consolidation or peribronchial infiltrates although more often consolidation and ground glass infiltrates may be more commonly seen. In the setting of these consolidations and intrathoracic lymphadenopathy, further workup is indicated to evaluate for other atypical organisms including fungal and mycobacterial causes. Malignancy is also a consideration given bulky lymphadenopathy and tobacco use history.     While the aspergillus is found in the sputum culture, it is still unclear if this is a colonizer or causing his pulmonary disease. Further information including the aspergillus EIA with biopsy results would more definitively determine this prior to starting therapy. Also given no clinical change, will hold on treatment until we can further evaluate the pulmonary lesions with more information.     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    Author:  Dub Amis, MD 02/07/2017 8:45 AM  Internal Medicine, PGY-2    DWA Dr Charmian Muff

## 2017-02-07 NOTE — Progress Notes
Internal Medicine Progress Note      PMD: Wilburn Mylar, MD  DATE OF SERVICE: 02/07/2017  HOSPITAL DAY: 6  CHIEF COMPLAINT:   Chief Complaint   Patient presents with   ??? Shortness of Breath     dx with pneumonia at OSH on antibiotics with clear withe phelgm Denies fever.      Last 24 Hour/Overnight Events   No active overnight  pulm consulted for bronch, will plan for BAL tomorrow   Penile esions improving    Subjective/Review of Systems   Pt frustrated still at the hospital but feeling better overall. Denies SOB    2-point ROS negative except as outlined above.    Medications     Scheduled:    acyclovir 400 mg Oral TID   budesonide-formoterol 2 puff Inhalation BID   busPIRone 15 mg Oral BID   carvedilol 25 mg Oral BID   clopidogrel 75 mg Oral Daily   docusate 100 mg Oral BID   DULoxetine 60 mg Oral Daily   furosemide 40 mg Oral Daily   gabapentin 800 mg Oral TID   losartan 100 mg Oral Daily   metFORMIN 1,000 mg Oral BID w/meals   pantoprazole 40 mg Oral Daily   polyethylene glycol 17 g Oral Daily   pravastatin 40 mg Oral QHS   pyridoxine 100 mg Oral Daily   senna 1 tablet Oral QHS   tiotropium 18 mcg Inhalation Daily      Continuous:  ??? heparin 25,000 units/250 mL drip RTU 1,400 Units/hr (02/07/17 1802)      PRN:  albuterol, insulin aspart **AND** dextrose, diphenhydrAMINE, HYDROcodone-acetaminophen, LORazepam      Physical Exam   Temp:  [36 ???C (96.8 ???F)-36.8 ???C (98.2 ???F)] 36.4 ???C (97.5 ???F)  Heart Rate:  [65-75] 66  Resp:  [18-20] 20  BP: (112-156)/(54-69) 114/54  NBP Mean:  [72-92] 72  SpO2:  [91 %-96 %] 91 %   UOP: Urine:  [450 mL-900 mL] 900 mL I/Os:     Intake/Output Summary (Last 24 hours) at 02/07/17 1954  Last data filed at 02/07/17 1800   Gross per 24 hour   Intake              614 ml   Output             1350 ml   Net             -736 ml     General:  76 yo AA well-developed???male???in no acute distress, appears comfortabe   Eyes: PERRL, EOMI  HEENT: OP clear, MMM, NC in place (2L) CV: RRR, normal S1S2, no m/r/g  Pulm: improved crackles bilaterally, mild audible wheezing. No rhonchi.   Abd: NABS, soft, flat, NT, no masses or organomegaly  Skin: Warm and dry, no rashes  Ext: trace edema  Neuro: A&Ox3, CNII-XII intact, motor 5/5 throughout, sensation grossly intact    Laboratory Data   CBC:  Recent Labs      02/06/17   0531  02/05/17   0648   WBC  9.77  10.13*   NEUTABS  7.08*  7.67*   HGB  14.0  13.8   HCT  44.1  43.0   MCV  88.2  88.1   PLT  122*  129*     BMP:  Recent Labs      02/07/17   0545  02/06/17   0531  02/05/17   0648   NA  137  138  138  K  4.2  5.2  4.5   CL  95*  98  96   CO2  28  31*  33*   BUN  24*  23*  14   CREAT  0.92  1.01  0.80   GLUCOSE  151*  146*  145*   CALCIUM  8.5*  9.2  9.3   MG  1.6  1.5  1.7     LFT:  Recent Labs      02/06/17   0531   TOTPRO  6.1   ALBUMIN  3.2*   BILITOT  0.3   BILICON  <0.2   ALT  26   AST  27   ALKPHOS  51   UA:  No results for input(s): SPECGRAVUR, PHUR, BLDUR, KETONESUR, GLUCOSEUR, PROTCLUR, NITRITEUR, LEUKESTUR, RBCSUR, WBCSUR in the last 72 hours.    Microbiology   Cryptococcal Ag blood 02/06/17: negative  Bacterial Culture Respiratory 02/01/17: Few Probable candida albicans , Aspergillus species    HSV1/2 penile 02/05/17: detected   Bacterial cx penile 02/05/17: few coagulase negative staphyloccocus like colonies, few enterococcus like colonies  Chlamydia and gonorrhoeea urine pending   MRSA nares 02/01/17: No Methicillin-resistant Staphylococcus aureus isolated.   Sputum screen 02/01/17: <10 Epithelial cells per low power field   Resp bacterial Cx 02/01/17: <10 Epithelial cells per low power field   Legionella urine 02/01/17: negative    Imaging   12/10 CT chest  Coalescent airspace and nodular consolidation within the left upper lobe and lingula, and to a lesser extent, within the right upper and right lower lobes, consistent with pneumonia, likely fungal. Associated bulky intrathoracic multicompartmental lymphadenopathy; for example, AP window lymph node, currently 38 x 23 mm (3-40).    Assessment/Plan   Ryan Shepard is a 76 y.o. male???with COPD, hx of tobacco use disorder, PAD s/p femoral graft, HTN, DM2, HLD, A.fib, GERD, depression, anxiety, chronic pain, who presents with intermittent SOB and LE edema???for the past week. Patient has polypharmacy and needs good outpatient med reconciliation. Patient would like to transfer his care to [facility] and be set up with a new PCP.   ???  #Pneumonia  positive aspirgillus on resp cx and characteristics of consilidation on CT raises concern for fungal pneumonia.   - ID following  - Pulm consulted, appreciate rec's:   - plan for BAL in AM (NPO at MD, hold apixiban and start heparin gtt given CHADVASC elevated (7.2% stroke risk) and hold starting 5 AM 12/13)  - ordered beta-D glucan and galctomannan assays   - fu fungal studies (histoplasma quant gold)  - pt will neef follow up with Dr. Larae Grooms He 1-2 wks post discharge with plans to repeat CT w/o contrast within 6 weeks post discharge and if evidence of ongoing LAD or consolidation pulm will plan for possible repeat bronchoscopy with biopsy to evaluate underlying malignancy   ???  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 Tender vesicles seen on penile meatus on exam, patient reports ongoing x 1 day; on re-disussion with patient, denies previous episodes.  - acyclovir 400mg  po TID x 7 days (first episode) (12/10-12/17)  - follow C/G urine PCR  - obtain HIV screen (verbal consent obtained from patient)   ???  # acute on chronic respiratory failure   #grade 1 diastolic dysfunction   DDx:???COPD exacerbation, community vs hospital acquired pneumonia, less likely pulmonary congestion 2/2 CHF. Patient has an extensive tobacco use history with dx COPD and was recently admitted to OSH  in [place] for PNA (undertreated augmentin not great for atypicals)  CXR 02/01/17 revealed increasing diffuse confluent airspace opacities within the left suprahilar region and perihilar region with interstitial prominence throughout the left lung and to lesser extent right lung concerning for multifocal aspiration/pneumonia. Aortic calcification tortuosity with cardiomegaly.  procalcitonin <0.1. Weight downtrending.   PE: crackles bilaterally, 3+ LE edema, Bedside U/S revealed IVC of 2.7 cm, non-collapsable and RIJ ~ 6-7 cm, non-collapsable   - TTE 02/01/17 revealed grade 1 diastolic dysfunction, LVEF 70-75%  - strict ins/outs   - daily weights (103.4 kg on admission)  - continue PO Lasix 40 mg daily   - continue PO levofloxacin 750 mg daily, plan for 7-day course last day 12/12  - keep SpO2 >???88 (on NC --> okay to use face mask)  - symbicort 2 puffs BID with teaching   - s/p Prednisone 40 mg PO daily x 5 days (12/6-12/10) last day today  ???  #hx of Afib  - continuous cardiac monitoring   - continue home carvedilol 25 mg daily ???BID   - HOLD home apixaban 5 mg BID for procedure tomorrow   ???  #PAD: chronic/stable  PAD s/p femoral-femoral graft (2009)  - continue clopidogrel 75 mg daily   ???  #HTN chronic/stable  SBP 130s-170s,, ( home losartan-HCTZ (non-formulary)), Cr stable  - continue losartan 100 mg daily   - Cont coreg 25mg  BID  ???  #HLD chronic/stable  - continue home pravastatin 40 mg nightly   ???  #GERD  - continue pantoprazole 40 mg daily   ???  #DM2 chronic/stable  - resume home metformin  - accuchecks (GLC 125-240), patient on prednisone  - insulin sliding scale algorithm #3  - continue home gabapentin 800 mg ???TID   ???  #depression   #anxiety  - continue buspirone 15 mg BID  - continue home duloxetine DR 60   - continue home lorazepam 2 mg PRN BID  ???  #chornic pain  Per patient ongoing lower back and lower extremity pain for several years  - continue home Norco 10-325 mg q6h PRN   - schedule with outpatient pain management at [facility]   ???  Inpatient Checklist:  FEN/GI:???  Diet: Diet carbohydrate controlled 2gm NA, NPO at MD  Analgesia:???Norco Sedation:???N/A  DVT Prophylaxis: on home apixaban and clopidogrel (HOLD apixaban and start heparin gtt until 5 AM 12/13)  GI Prophylaxis: on PPI  Glycemic control:???insulin aspart algorithm #2  Bowel regimen:???NA  ???  Code Status: Full Code  Contact: ???Primary Emergency Contact: Ryan Shepard, Ryan Shepard (wife), Home Phone: [phone]  ???  DWA Vertis Kelch., MD    Author:  Bascom Levels, MD  [phone]  02/07/2017 7:54 PM     I saw and evaluated [name].  I have reviewed 24hr interval labs, radiology, and notes. I discussed the case with the resident and agree with the findings and plan of care as documented in the resident's note.   DDx for pulm findings includes atypical infection or less likely neoplasm. Bronch tomorrow. No empiric treatment.    Signed: Nicco Reaume C. Foster Simpson, MD

## 2017-02-07 NOTE — Other
Patients Clinical Goal:   Clinical Goal(s) for the Shift: Pain mgmt, <3/10, BG 70-140mg /dl  Identify possible barriers to advancing the care plan:   Stability of the patient: Moderately Stable - low risk of patient condition declining or worsening   End of Shift Summary: Patient a&o x4, BG 113 prior to breakfast, on 3L nasal cannula, patient made npo after breakfast, patient complained of generalized 7/10 pain and po Norco was administered as requested, no family at bedside but wife called and spoke to patient, BG 104 during the day while npo, diet re-started in afternoon, BG 133 prior to eating, PTT drawn and Heparin gtt initiated at 1400units/hr, and instructions was given to the patient regarding npo status after midnight, no other complaints.

## 2017-02-07 NOTE — Other
Patients Clinical Goal:   Clinical Goal(s) for the Shift: VSS, afebrile, no SOB/dyspnea, O2 sat > 92%, pain < 3/10, maintain pt safety  Identify possible barriers to advancing the care plan:   Stability of the patient: Moderately Stable - low risk of patient condition declining or worsening   End of Shift Summary: VSS, afebrile. SR on cont cardiac monitor. Remains at 3L O2 via N/C sat > 92%. No SOB/dyspnea noted. Pt in NAD. Cont to c/o back pain managed with norco PRN. Pt NPO since MN for poss bronch. Otherwise, All needs attended to. Will cont to monitor

## 2017-02-07 NOTE — Progress Notes
INPATIENT INFECTIOUS DISEASE Progress note    PATIENT:  Ryan Shepard  MRN:  [id number]  DOB:  [birth year]  DATE OF ADMISSION: 02/01/2017    DATE OF SERVICE:  02/07/2017  REQUESTING PHYSICIAN: Internal Medicine  REASON FOR CONSULTATION: Aspergillus     Chief Complaint:  SOB    History of Present Illness:   Mr Ryan Shepard is a 76 y/o M with COPD, tobacco use disorder, PAD s/p femoral graft, HTN, DM2, HLD, Afib, GERD, depression, anxiety, chronic pain presenting for intermittent SOB.     12/6: Admitted with a COPD exacerbation in the setting of diagnosed COPD and extensive tobacco use disorder. CXR on admission showing confluent airspace opacities c/f multifocal aspiration/pneumonia. Procal neg. Treated with levaquin (12/6- ) x 7 days and 5 day course of prednisone.   12/11: Patient was planned for discharge but then was found on bacterial resp culture from admission (12/6) to have aspergillus species, few probably candida. Other micro including legionella urine ag neg, MRSA nares neg, HIV neg. Had CT chest done showing airspace and nodular consolidation within the left upper lobe and lingula and lesser within the right upper and right lower lobes consistent with pneumonia, likely fungal. Also with bulky intrathoracic multicompartamental lymphadenopathy.     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 Started on acyclovir 800 mg BID x 5 days. Gonorrhea/chlamydia neg. RPR pending. Bacterial culture no growth to date. HIV neg.     12/12: VSS, no other acute changes. Requiring 3 L NC. No changes in respiratory status.       Allergies:   No Known Allergies    ID Medications:   Anti-infective Meds       Disp Refills Start End    acyclovir 800 mg tablet 8 tablet 0 02/05/2017     Sig - Route: Take 1 tablet (800 mg total) by mouth two (2) times daily. - Oral Cosign for Ordering: Accepted by Pura Spice., MD on 02/05/2017  8:46 PM    levoFLOXacin 750 mg tablet 2 tablet 0 02/06/2017 02/07/2017    Sig - Route: Take 1 tablet (750 mg total) by mouth daily for 1 day. - Oral    Cosign for Ordering: Accepted by Pura Spice., MD on 02/05/2017  8:46 PM      Hospital Medications       Dose Frequency Start End    acyclovir tab 400 mg 400 mg 3 times daily 02/06/2017 02/13/2017    Sig - Route: Take 1 tablet (400 mg total) by mouth three (3) times daily. - Oral        Review of Systems:  A complete 14-point review of systems was negative except for as noted above.     Objective:  Vital Signs summary (past 24 hours):  Temp:  [36 ???C (96.8 ???F)-36.8 ???C (98.2 ???F)] 36.4 ???C (97.5 ???F)  Heart Rate:  [60-75] 75  Resp:  [18-20] 18  BP: (112-156)/(57-69) 140/58  NBP Mean:  [75-92] 81  SpO2:  [81 %-96 %] 96 %  Temp (24hrs), Avg:36.4 ???C (97.5 ???F), Min:36 ???C (96.8 ???F), Max:36.8 ???C (98.2 ???F)    Intake and Output:   Last Two Completed Shifts:  I/O last 2 completed shifts:  In: 840 [P.O.:840]  Out: 1300 [Urine:1300]    Physical Exam:   Gen: no distress lying in bed on nasal O2, desaturates when O2 falls off  HEENT: PERRL, EOMI  CV: normal S1/S2, no murmurs   Pulmonary: crackles at right lower base, scattered wheezes and tubular breath sounds  GI: soft, BS+, not tender to light palpation, no masses/organomegaly  Skin: no rashes, normal turgor  Uro: multiple tender vesicles on the penial foreskin, uncircumsized  Neuro: awake, alert, moving all extremities  Psych: appropriate mood/affect, appropriate judgment/insight    Lab Tests/ Studies:  Recent Labs      02/06/17   0531  02/05/17   0648   WBC  9.77  10.13*   HGB  14.0  13.8   HCT  44.1  43.0   MCV  88.2  88.1   PLT  122*  129*       Recent Labs      02/07/17   0545  02/06/17   0531  02/05/17   0648   NA  137  138  138   K  4.2  5.2  4.5   CL  95*  98  96   CO2  28  31*  33*   BUN  24*  23*  14   CREAT  0.92  1.01  0.80 CALCIUM  8.5*  9.2  9.3   MG  1.6  1.5  1.7       Recent Labs      02/06/17   0531   TOTPRO  6.1   ALBUMIN  3.2*   BILITOT  0.3   BILICON  <0.2   ALT  26   AST  27   ALKPHOS  51       No results found for: Paulo Fruit, [place], GENTRAND, GENTTROUGH, AMIKPEAK, Alric Seton, TOBRARAND    Microbiology:   Aspergillus Ag EIA - IP   Aspergillus fumigatus (M3) IgE - IP   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+  Bacterial culture gram stain from vesicle - Coag neg staph like colonies, few enteroccocus like colonies  Cryptococcal Ag - neg  Cocci EIA/Ag - IP   12/6 Sputum few Candida and Aspergillus species, few normal flora  MRSA screen negative    HIV  - neg  Legionella Urinary Ag - neg  Chlamydia/gonorrhea PCR - neg     Imaging reviewed by me:  CT chest w/o contrast 12/10 reviewed with staff radiologist  IMPRESSION:  Atherosclerotic calcification of the thoracic aorta, brachiocephalic, bilateral common carotid, bilateral subclavian and bilateral axillary, left anterior descending, circumflex and right coronary arteries. Enlargement of the central pulmonary arteries, the main pulmonary artery currently 39 mm in maximal axial diameter, with associated calcifications, consistent with pulmonary arterial hypertension. Small left pleural effusion. Small pericardial effusion.  Moderate smoking-related centrilobular emphysema.  Coalescent airspace and nodular consolidation within the left upper lobe and lingula, and to a lesser extent, within the right upper and right lower lobes, consistent with pneumonia, likely fungal. Associated bulky intrathoracic multicompartmental   lymphadenopathy; for example, AP window lymph node, currently 38 x 23 mm (3-40).    Assessment:  Mr Ryan Shepard is a 76 y/o M with COPD, tobacco use d/o, PAD presenting with SOB and penile lesions.    #Aspergillus on resp cultures 12/6  #Vesciluar Penile lesions, HSV2+  #COPD Exacerbation   #Afib  #PAD  #HTN  #HLD  #GERD  #DM2 There is some data to suggest invasive aspergillosis that can occur in less immunosuppressed hosts in the setting of underlying COPD receiving steroid therapy. Pulmonary aspergillosis manifests as single or multiple nodules with or without cavitation, patchy or segmental  consolidation or peribronchial infiltrates although more often consolidation and ground glass infiltrates may be more commonly seen. In the setting of these consolidations and intrathoracic lymphadenopathy, further workup is indicated to evaluate for other atypical organisms including fungal and mycobacterial causes. Malignancy is also a consideration given bulky lymphadenopathy and tobacco use history.     While the aspergillus is found in the sputum culture, it is still unclear if this is a colonizer or causing his pulmonary disease. Further information including the aspergillus EIA with biopsy results would more definitively determine this prior to starting therapy. Also given no clinical change, will hold on treatment until we can further evaluate the pulmonary lesions with more information.     Recommendations / Plan:  - Please send histoplasma Ag urine, histoplasma Ag serum, MTB-Quantiferon  - F/u aspergillus EIA, aspergillus fumigatus IgE, Cooci EIA  - Agree with pulmonary consult to further evaluate these CT chest findings, consider bronch for BAL/lymph node biopsy   - Continue acyclovir 400 mg TID x 7 days for genital herpes     Please page General ID pager [phone]) with any additional questions.    Author:  Sigmund Hazel. Charmian Muff, MD 02/07/2017 1:33 PM

## 2017-02-07 NOTE — Progress Notes
PULMONARY AND CRITICAL CARE CONSULT NOTE    Patient: Ryan Shepard  MRN: [id number]  Date of Service: 02/07/2017  Attending Physician: Vertis Kelch., MD12/01/2017  Length of Stay: 6 days    Reason for Consultation   Ryan Shepard was seen by the Pulmonary and Critical Care consult service  for evaluation of nodular opacities concerning for pulmonary fungal infection.    History of Present Illness   Ryan Shepard is a 76 y.o. year old male with COPD (no PFT available, on tiotropium, budesonide-formoterol), prior tobacco use (>40 pack years, adamant that he quit ~ 20-25 years ago), PAD s/p femoral graft (2009, on plavix qD), HTN, HLD, DM2, pAfib (on apixaban) GERD, depression / anxiety, chronic pain, who presented with worsened dyspnea and sputum production on 12/6, concerning for a COPD exacerbation.    Patient reportedly has had a 3L NC O2 requirement for the past 3 years, although only intermittently uses, but noticed saturations of~ 80% prior to admission.  Endorsed some recent lower extremity swelling, and orthopnea, and was recently started on diuretics.  Endorses two prior hospitalizations in the past year for presumed COPD exacerbations, including one admission to the ICU at an OSH in [place], but denies any history of intubation or NIPPV.  Admitted on 12/6 for presumed COPD exacerbation reported in the setting of ongoing tobacco use; CXR demonstrating confluent airspace opacities concerning for multifocal aspiration/pneumonia although procalcitonin was negative at that time..  Patient endorsed worsened dyspnea (exertional > rest), as well as increased productive cough with ''variable'' color sputum, including ''rust.''  Denies any frank hemoptysis, fevers, chills, night sweats, chest pain, palpitations; thinks he may have been losing weight although is uncertain.    Denies any history of homelessness, incarceration or recent travel outside of the country.  Went to [place] in the summer, and [place] in the fall.    Patient was treated with levofloxacin, pred 40 x 5 days, and duonebs.  On 12/11, patient was deemed ready for discharge with a stable 2-3L NC requirement (although per patient not back to his baseline); however, respiratory cultures from admission demonstrated candida and aspergillus species.  Patient underwent CT chest demonstrating: nodular consolidation within LUL and lingula, as well as RUL and RLL read as consistent with likely fungal infection.  In addition, patient found to have  bulky intrathoracic multicompartamental lymphadenopathy.   ID consultation recommended pulmonary evaluation for bronchoscopy with likely biopsy.  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    Hospital Course/Interval Events     PO lasix 40mg  qD  Abx: Levaquin (12/6 - 12/12)  S/p prednisone 40mg  qD (12/6 - 12/10)  Duonebs 12/6 - 12/11; now on home meds.  Last dose apixaban 12/12 AM @ 9 AM.  Last dose plavix 12/12 AM    Past Medical History     Past Medical History:   Diagnosis Date   ??? Cancer (HCC/RAF)    ??? Diabetes (HCC/RAF)    ??? GERD (gastroesophageal reflux disease)    ??? Hypercholesteremia    ??? Hypertension    ??? PAD (peripheral artery disease) (HCC/RAF)    ??? 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 Prostate disease    ??? Scoliosis     adolesent   ??? Vascular disease        Past Surgical History     Past Surgical History:   Procedure Laterality Date   ???  BACK SURGERY     ??? BILATERAL FEMORAL ARTERY EXPLORATION  2009   ??? BLADDER SURGERY  1999   ??? PROSTATE SURGERY  2000        Family History     Family History   Problem Relation Age of Onset   ??? Cancer Mother    ??? Cancer Father    ??? Malignant hyperthermia Neg Hx        Social History     States that he quit smoking in mid 90s; 2-3 packs per day for >20 years.  Denies marijuana use  Rare alcohol use Denies any cocaine / methamphetamine use.  Lives in [place] with wife      Medications     Scheduled:    acyclovir 400 mg Oral TID   apixaban 5 mg Oral BID   budesonide-formoterol 2 puff Inhalation BID   busPIRone 15 mg Oral BID   carvedilol 25 mg Oral BID   clopidogrel 75 mg Oral Daily   docusate 100 mg Oral BID   DULoxetine 60 mg Oral Daily   furosemide 40 mg Oral Daily   gabapentin 800 mg Oral TID   losartan 100 mg Oral Daily   metFORMIN 1,000 mg Oral BID w/meals   pantoprazole 40 mg Oral Daily   polyethylene glycol 17 g Oral Daily   pravastatin 40 mg Oral QHS   pyridoxine 100 mg Oral Daily   tiotropium 18 mcg Inhalation Daily      Continuous:     PRN:  albuterol, insulin aspart **AND** dextrose, diphenhydrAMINE, HYDROcodone-acetaminophen, LORazepam, senna       Allergies   No Known Allergies     Physical Exam   Temp:  [36 ???C (96.8 ???F)-36.8 ???C (98.2 ???F)] 36.8 ???C (98.2 ???F)  Heart Rate:  [60-75] 67  Resp:  [18-20] 18  BP: (112-156)/(57-69) 156/64  NBP Mean:  [75-92] 89  SpO2:  [81 %-96 %] 93 %     Gen: Well-developed male in no acute distress  Eyes: PERRL, EOMI  ENT: OP clear, MMM  Neck: Supple, trachea midline, no cervical or supraclavicular lymphadenopathy  CV: RRR, normal S1S2, no m/r/g  Pulm: Prolonged expiratory phase with scattered wheezes throughout.  Crackles at RLL, adventitial sounds in LUL.  Abd: NABS, soft, flat, NT, no masses or organomegaly  Skin: Warm and dry, no rashes  Ext: No cyanosis or edema. Distal pulses 2+ and symmetric  Neuro: A&Ox4, CNII-XII intact, motor 5/5 throughout, sensation grossly intact    Laboratory Data   CBC  Recent Labs      02/06/17   0531  02/05/17   0648   WBC  9.77  10.13*   HGB  14.0  13.8   HCT  44.1  43.0   MCV  88.2  88.1   PLT  122*  129*     BMP  Recent Labs      02/07/17   0545  02/06/17   0531  02/05/17   0648   NA  137  138  138   K  4.2  5.2  4.5   CL  95*  98  96   CO2  28  31*  33*   BUN  24*  23*  14   CREAT  0.92  1.01  0.80 CALCIUM  8.5*  9.2  9.3   MG  1.6  1.5  1.7     LFT  Recent Labs      02/06/17  0531   TOTPRO  6.1   ALBUMIN  3.2*   BILITOT  0.3   BILICON  <0.2   ALT  26   AST  27   ALKPHOS  51     Coags  No results for input(s): INR, PT, APTT in the last 72 hours.     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 Enlargement of the central pulmonary arteries, the main pulmonary artery currently 39 mm in maximal axial diameter, with associated calcifications, consistent with pulmonary arterial hypertension. Small left pleural effusion. Small pericardial effusion.  Moderate smoking-related centrilobular emphysema.  Coalescent airspace and nodular consolidation within the left upper lobe and lingula, and to a lesser extent, within the right upper and right lower lobes, consistent with pneumonia, likely fungal. Associated bulky intrathoracic multicompartmental lymphadenopathy; for example, AP window lymph node, currently 38 x 23 mm (3-40).    02/04/2017 CXR:  IMPRESSION:  Persistent airway thickening in the left lung and more confluent airspace opacity in the left perihilar region, more prominent in the upper lobe as well as airway thickening and patchy opacities in the right infrahilar/perihilar region, suspicious for multifocal pneumonia without significant interval change, allowing for change in lung volumes. Follow-up to resolution suggested to exclude underlying mass lesion in the left perihilar region.  Small left and probable trace right pleural effusion. No pneumothorax.  Cardiomediastinal silhouette unchanged.    02/01/2017 CXR:  Increasing diffuse confluent airspace opacities within the left suprahilar region and perihilar region with interstitial prominence throughout the left lung and to lesser extent right lung concerning for multifocal aspiration/pneumonia.  Aortic calcification tortuosity with cardiomegaly.  No large effusions or significant pneumothorax.  Osteopenia and multilevel spondylotic maturation changes.    02/01/2017 TTE:   1. Technically difficult study due to poor acoustic windows.   2. Small LV size, moderate concentric left ventricular hypertrophy, hyperdynamic systolic function, mild LV diastolic dysfunction (grade I, impaired relaxation).   3. Left ventricular ejection fraction is approximately 70-75%.   4. Normal right ventricular size and normal systolic function.   5. There are no prior echocardiograms available for comparison.    Assessment   SAMEUL SCHRICK is a 76 y.o. male OPD (no PFT available, on tiotropium, budesonide-formoterol), extensive smoking history (>40 pack years), PAD s/p femoral graft (2009, on plavix qD), HTN, HLD, DM2, pAfib (on apixaban) GERD, depression / anxiety, chronic pain, who presented with worsened dyspnea and sputum production on 12/6 now improved following treatment for COPD exacerbation with levaquin, duonebs and 5 day course of oral steroids.    The pulmonology team is consulted for evaluation of nodular consolidation and thoracic lymphadenopathy on CT w/o contrast in an HIV negative male, performed in the setting of recent isolation of aspergillus and candida in sputum samples.   The patient is without known immunosuppression and per  report has not been on chronic steroids outside of the hospital.  While the imaging could be consistent with pulmonary aspergillosis with reactive lymphadenopathy, the aspergillus isolated on sputum culture may also represent a benign colonizer.  Unfortunately, the patient is currently receiving apixaban and plavix, which would delay any attempts at biopsy (necessary for definitive histopathologic diagnosis of invasive aspergillosis) for > 5 days.  Nevertheless, a bronchoscopy with BAL would be of utility for identify other potential fungal or mycobacterial etiologies and surrogate markers of aspergillus infection.  The patient's radiographic findings may also represent malignancy, however, will first plan to evaluate for underlying infectious etiologies before biopsying the nodules / lymph nodes.    Recommendations   - Plan for BAL tomorrow (02/08/2017), we will defer biopsy at this time.  Please hold apixaban starting tonight and make NPO at midnight.  Patient's CHA2DS2-VASc at least 5, with annual risk of stroke estimated at 7.2%, for an estimated daily risk of ~ 0.02%.  Please hold any heparin products starting at 5AM on 02/08/2017.  - Please send serum beta-D glucan and galactomannan assays; we will also sent BAL galactommannan assay, which if positive, in the absence of other negative fungal work-up and definitive histopathology, may suggest invasive aspergillus infection.  - Follow-up histoplasma urine / serum Ag, Cocci EIA, Aspergillus Ag EIA, Aspergillus fumigatus IgE, MTB quantiferon studies.  - Please have patient follow-up with Dr. Gearldine Bienenstock He (scheduler [phone]) 1-2 weeks following discharge.  We will plan for a repeat CT chest without contrast within 6 weeks following discharge.  If there is evidence of ongoing lymphadenopathy or consolidation without definitive explanation, we will likely plan for repeat bronchoscopy with biopsy to evaluate for underlying malignancy.    DWA Dr. Gearldine Bienenstock He. Please page the on-call pulmonary consult fellow [phone]) with any questions or concerns.  We will continue to follow    Author:  Purvis Kilts. Daphane Shepherd, MD  [phone]  02/07/2017 10:51 AM

## 2017-02-08 ENCOUNTER — Ambulatory Visit: Payer: MEDICARE

## 2017-02-08 DIAGNOSIS — J9601 Acute respiratory failure with hypoxia: Secondary | ICD-10-CM

## 2017-02-08 DIAGNOSIS — R9389 Abnormal findings on diagnostic imaging of other specified body structures: Secondary | ICD-10-CM

## 2017-02-08 DIAGNOSIS — Z720 Tobacco use: Secondary | ICD-10-CM

## 2017-02-08 DIAGNOSIS — I959 Hypotension, unspecified: Secondary | ICD-10-CM

## 2017-02-08 LAB — APTT
APTT: 36.3 s — ABNORMAL HIGH (ref 24.4–36.2)
APTT: 70.7 s — ABNORMAL HIGH (ref 24.4–36.2)
APTT: 114.8 s — ABNORMAL HIGH (ref 24.4–36.2)

## 2017-02-08 LAB — Blood Gases,venous: O2 SATURATION/MEASURED: 30.8

## 2017-02-08 LAB — Acid-Fast Stain Modified: ACID-FAST STAIN MODIFIED: NEGATIVE

## 2017-02-08 LAB — Basic Metabolic Panel
CALCIUM: 8.5 mg/dL — ABNORMAL LOW (ref 8.6–10.4)
CREATININE: 1.16 mg/dL (ref 0.60–1.30)

## 2017-02-08 LAB — Gram Stain Respiratory: GRAM STAIN (GENERAL): NONE SEEN

## 2017-02-08 LAB — Aspergillus fumigatus (M3) IgE: ASPERGILLUS FUMIGATUS (M3) IGE BPC: <0.1 kU/L

## 2017-02-08 LAB — Glucose,POC
GLUCOSE,POC: 114 mg/dL — ABNORMAL HIGH (ref 65–99)
GLUCOSE,POC: 116 mg/dL — ABNORMAL HIGH (ref 65–99)
GLUCOSE,POC: 122 mg/dL — ABNORMAL HIGH (ref 65–99)
GLUCOSE,POC: 133 mg/dL — ABNORMAL HIGH (ref 65–99)
GLUCOSE,POC: 98 mg/dL (ref 65–99)

## 2017-02-08 LAB — Magnesium: MAGNESIUM: 1.3 meq/L — ABNORMAL LOW (ref 1.4–1.9)

## 2017-02-08 LAB — Respiratory Pathogen Panel PCR
ADENOVIRUS DNA PCR: NOT DETECTED
INFLUENZA A PCR: NOT DETECTED

## 2017-02-08 LAB — Coccidioides Antigen

## 2017-02-08 LAB — Fungal Stain

## 2017-02-08 LAB — Aspergillus Ag: ASPERGILLUS ANTIGEN EIA: <0.5 (ref <0.50)

## 2017-02-08 MED ADMIN — METFORMIN HCL 500 MG PO TABS: 1000 mg | ORAL | @ 01:00:00 | Stop: 2017-02-09 | NDC 00904668961

## 2017-02-08 MED ADMIN — CLOPIDOGREL BISULFATE 75 MG PO TABS: 75 mg | ORAL | @ 20:00:00 | Stop: 2017-02-13 | NDC 00904629461

## 2017-02-08 MED ADMIN — IPRATROPIUM BROMIDE 0.02 % IN SOLN: 500 ug | RESPIRATORY_TRACT | @ 20:00:00 | Stop: 2017-02-08 | NDC 00487980101

## 2017-02-08 MED ADMIN — LOSARTAN POTASSIUM 50 MG PO TABS: 100 mg | ORAL | @ 20:00:00 | Stop: 2017-02-09 | NDC 68084034701

## 2017-02-08 MED ADMIN — SENNOSIDES 8.6 MG PO TABS: 1 | ORAL | @ 04:00:00 | Stop: 2017-02-21 | NDC 00904652261

## 2017-02-08 MED ADMIN — PRAVASTATIN SODIUM 40 MG PO TABS: 40 mg | ORAL | @ 04:00:00 | Stop: 2017-02-22 | NDC 51079078220

## 2017-02-08 MED ADMIN — INSULIN ASPART 100 UNIT/ML SC SOPN: 3 mL | SUBCUTANEOUS | @ 07:00:00 | Stop: 2017-02-22 | NDC 00169633910

## 2017-02-08 MED ADMIN — HYDROCODONE-ACETAMINOPHEN 5-325 MG PO TABS: 2 | ORAL | @ 20:00:00 | Stop: 2017-02-22 | NDC 00406012362

## 2017-02-08 MED ADMIN — APIXABAN 5 MG PO TABS: 5 mg | ORAL | @ 20:00:00 | Stop: 2017-02-13 | NDC 00003089431

## 2017-02-08 MED ADMIN — PANTOPRAZOLE SODIUM 40 MG PO TBEC: 40 mg | ORAL | @ 20:00:00 | Stop: 2017-02-22 | NDC 68084081309

## 2017-02-08 MED ADMIN — METFORMIN HCL 500 MG PO TABS: 1000 mg | ORAL | @ 21:00:00 | Stop: 2017-02-09

## 2017-02-08 MED ADMIN — DOCUSATE SODIUM 100 MG PO CAPS: 100 mg | ORAL | @ 20:00:00 | Stop: 2017-02-22 | NDC 00904645561

## 2017-02-08 MED ADMIN — ACYCLOVIR 400 MG PO TABS: 400 mg | ORAL | @ 13:00:00 | Stop: 2017-02-10 | NDC 00904579061

## 2017-02-08 MED ADMIN — FUROSEMIDE 40 MG PO TABS: 40 mg | ORAL | @ 20:00:00 | Stop: 2017-02-09 | NDC 51079007320

## 2017-02-08 MED ADMIN — ALBUTEROL SULFATE (5 MG/ML) 0.5% IN NEBU: 2.5 mg | RESPIRATORY_TRACT | @ 20:00:00 | Stop: 2017-02-08 | NDC 00487990130

## 2017-02-08 MED ADMIN — GABAPENTIN 400 MG PO CAPS: 800 mg | ORAL | @ 13:00:00 | Stop: 2017-02-22 | NDC 68084077401

## 2017-02-08 MED ADMIN — GABAPENTIN 400 MG PO CAPS: 800 mg | ORAL | @ 20:00:00 | Stop: 2017-02-22 | NDC 68084077401

## 2017-02-08 MED ADMIN — HEPARIN 25,000 UNITS/250 ML 0.45% NACL RTU: 1400 [IU]/h | INTRAVENOUS | @ 02:00:00 | Stop: 2017-02-08 | NDC 00409765062

## 2017-02-08 MED ADMIN — PROPOFOL 200 MG/20ML IV EMUL: @ 18:00:00 | Stop: 2017-02-08 | NDC 63323026929

## 2017-02-08 MED ADMIN — MAGNESIUM SULFATE 5-6 GM IVPB: 6 g | INTRAVENOUS | @ 16:00:00 | Stop: 2017-02-08 | NDC 63323006411

## 2017-02-08 MED ADMIN — INSULIN ASPART 100 UNIT/ML SC SOPN: 3 mL | SUBCUTANEOUS | @ 01:00:00 | Stop: 2017-02-22 | NDC 00169633910

## 2017-02-08 MED ADMIN — ALBUTEROL SULFATE HFA 108 (90 BASE) MCG/ACT IN AERS: 2 | RESPIRATORY_TRACT | Stop: 2017-02-09 | NDC 00173068224

## 2017-02-08 MED ADMIN — POLYETHYLENE GLYCOL 3350 17 G PO PACK: 17 g | ORAL | @ 20:00:00 | Stop: 2017-02-22

## 2017-02-08 MED ADMIN — BUSPIRONE HCL 5 MG PO TABS: 15 mg | ORAL | @ 20:00:00 | Stop: 2017-02-21 | NDC 16729020001

## 2017-02-08 MED ADMIN — SODIUM CHLORIDE 0.9 % IV BOLUS: 250 mL | INTRAVENOUS | Stop: 2017-02-09 | NDC 00338630402

## 2017-02-08 MED ADMIN — FENTANYL CITRATE (PF) 100 MCG/2ML IJ SOLN: @ 17:00:00 | Stop: 2017-02-08 | NDC 00409909422

## 2017-02-08 MED ADMIN — ACYCLOVIR 400 MG PO TABS: 400 mg | ORAL | @ 20:00:00 | Stop: 2017-02-10 | NDC 00904579061

## 2017-02-08 MED ADMIN — ACYCLOVIR 400 MG PO TABS: 400 mg | ORAL | @ 04:00:00 | Stop: 2017-02-10 | NDC 00904579061

## 2017-02-08 MED ADMIN — HYDROCODONE-ACETAMINOPHEN 5-325 MG PO TABS: 2 | ORAL | @ 13:00:00 | Stop: 2017-02-22 | NDC 00406012362

## 2017-02-08 MED ADMIN — CARVEDILOL 25 MG PO TABS: 25 mg | ORAL | @ 04:00:00 | Stop: 2017-02-09 | NDC 00904630361

## 2017-02-08 MED ADMIN — SODIUM CHLORIDE 0.9 % IV SOLN: @ 18:00:00 | Stop: 2017-02-08 | NDC 00338961212

## 2017-02-08 MED ADMIN — DULOXETINE HCL 60 MG PO CPEP: 60 mg | ORAL | @ 20:00:00 | Stop: 2017-02-22 | NDC 68084067521

## 2017-02-08 MED ADMIN — POLYETHYLENE GLYCOL 3350 17 G PO PACK: 17 g | ORAL | @ 20:00:00 | Stop: 2017-02-22 | NDC 68084043098

## 2017-02-08 MED ADMIN — DIPHENHYDRAMINE-ZINC ACETATE 1-0.1 % EX CREA: TOPICAL | @ 22:00:00 | Stop: 2017-02-22

## 2017-02-08 MED ADMIN — CARVEDILOL 25 MG PO TABS: 25 mg | ORAL | @ 20:00:00 | Stop: 2017-02-09 | NDC 00904630361

## 2017-02-08 MED ADMIN — DOCUSATE SODIUM 100 MG PO CAPS: 100 mg | ORAL | @ 04:00:00 | Stop: 2017-02-22 | NDC 00904645561

## 2017-02-08 MED ADMIN — BUSPIRONE HCL 5 MG PO TABS: 15 mg | ORAL | @ 04:00:00 | Stop: 2017-02-21 | NDC 16729020001

## 2017-02-08 MED ADMIN — BUDESONIDE-FORMOTEROL FUMARATE 160-4.5 MCG/ACT IN AERO: 2 | RESPIRATORY_TRACT | @ 05:00:00 | Stop: 2017-02-22 | NDC 00186037028

## 2017-02-08 MED ADMIN — HYDROCODONE-ACETAMINOPHEN 5-325 MG PO TABS: 2 | ORAL | @ 04:00:00 | Stop: 2017-02-22 | NDC 00406012362

## 2017-02-08 MED ADMIN — PYRIDOXINE HCL 50 MG PO TABS: 100 mg | ORAL | @ 20:00:00 | Stop: 2017-02-22 | NDC 00536440801

## 2017-02-08 MED ADMIN — TIOTROPIUM BROMIDE MONOHYDRATE 18 MCG IN CAPS: 18 ug | RESPIRATORY_TRACT | @ 19:00:00 | Stop: 2017-02-09 | NDC 00597007575

## 2017-02-08 MED ADMIN — BUDESONIDE-FORMOTEROL FUMARATE 160-4.5 MCG/ACT IN AERO: 2 | RESPIRATORY_TRACT | @ 19:00:00 | Stop: 2017-02-22 | NDC 00186037028

## 2017-02-08 MED ADMIN — GABAPENTIN 400 MG PO CAPS: 800 mg | ORAL | @ 04:00:00 | Stop: 2017-02-22 | NDC 68084077401

## 2017-02-08 MED ADMIN — PROPOFOL 200 MG/20ML IV EMUL: @ 17:00:00 | Stop: 2017-02-08 | NDC 63323026929

## 2017-02-08 NOTE — H&P
UPDATED H&P REQUIREMENT    For [facility] and [facility]    WHAT IS THE STATUS OF THE PATIENT'S MOST CURRENT HISTORY AND PHYSICAL?   - The most current H&P is >24 hours and <30 days, and having examined the patient, I attest that there have been no changes. (This suffices as an update to the H&P).      REFER TO MEDICAL STAFF POLICIES REGARDING PRE-PROCEDURE HISTORY AND PHYSICAL EXAMINATION AND UPDATED H&P REQUIREMENTS BELOW:    [facility] and [facility] Medical Staff Policy 200 - For Patients Undergoing Procedures Requiring Moderate or Deep Sedation, General Anesthesia or Regional Anesthesia    Contents of a History and Physical Examination (H&P):    The H&P shall consist of chief complaint, history of present illness, allergies and medications, relevant social and family history, past medical history, review of systems and physical examination, and assessment and plan appropriate to the patient's age.    For Patients Undergoing Procedures Requiring Moderate or Deep Sedation, General Anesthesia or Regional Anesthesia:    1. An H&P shall be performed within 24 hours prior to the procedure by a qualified member of the medical staff or designee with appropriate privileges, except as noted in item 2 below.    2. If a complete history and physical was performed within thirty (30) calendar days prior to the patient's admission to the Medical Center for elective surgery, a member of the medical staff assumes the responsibility for the accuracy of the clinical information and will need to document in the medical record within twenty-four (24) hours of admission and prior to surgery or major invasive procedure, that they either attest that the history and physical has been reviewed and accepted, or document an update of the original history and physical relevant to the patient's current  clinical status.    3. Providing an H&P for patients undergoing surgery under local anesthesia is at the discretion of the Attending Physician.     4. When a procedure is performed by a dentist, podiatrist or other practitioner who is not privileged to perform an H&P, the anesthesiologist's assessment immediately prior to the procedure will constitute the 24 hour re-assessment.The dentist, podiatrist or other practitioner who is not privileged to perform an H&P will document the history and physical relevant to the procedure.    5. If the H&P and the written informed consent for the surgery or procedure are not recorded in the patient's medical record prior to surgery, the operation shall not be performed unless the attending physician states in writing that such a delay could lead to an adverse event or irreversible damage to the patient.    6. The above requirements shall not preclude the rendering of emergency medical or surgical care to a patient in dire circumstances.

## 2017-02-08 NOTE — Consults
Per [facility]DR bronch done today. Plan to monitor overnight and DC to home with [facility] tomorrow.    HH arranged with [facility]. Message left regarding DC plans.    Will continue to follow.    Michaela Corner, RN, MSN  Clinical Case Manager - Coverage for Medicine Team D  [phone]

## 2017-02-08 NOTE — Progress Notes
PULMONARY AND CRITICAL CARE CONSULT NOTE    Patient: Ryan Shepard  MRN: [id number]  Date of Service: 02/08/2017  Attending Physician: Vertis Kelch., MD12/13/2018  Length of Stay: 7 days    Reason for Consultation   Ryan Shepard was seen by the Pulmonary and Critical Care consult service  for evaluation of nodular opacities concerning for pulmonary fungal infection.    History of Present Illness   Ryan Shepard is a 76 y.o. year old male with COPD (no PFT available, on tiotropium, budesonide-formoterol), prior tobacco use (>40 pack years, adamant that Kaizlee Carlino quit ~ 20-25 years ago), PAD s/p femoral graft (2009, on plavix qD), HTN, HLD, DM2, pAfib (on apixaban) GERD, depression / anxiety, chronic pain, who presented with worsened dyspnea and sputum production on 12/6, treated for a COPD exacerbation with levofloxacin, oral steroids and duonebs.    Pulmonary medicine is consulted to evaluate nodular consolidation in LUL and lingula as well as bulky intrathoracic lymphadenompathy.    Hospital Course/Interval Events     PO lasix 40mg  qD  Abx: Levaquin (12/6 - 12/12)  S/p prednisone 40mg  qD (12/6 - 12/10)  Duonebs 12/6 - 12/11; now on home meds.  Last dose apixaban 12/12 AM @ 9 AM.  Last dose plavix 12/12 AM    02/08/2017 - Ongoing 2-3L NC requirement.  Underwent bronchoscopy with BAL - please see separate procedure report for full details -  but notable for hypervascularity in LUL and lingula concerning for malignancy vs infection.    Past Medical History     Past Medical History:   Diagnosis Date   ??? Cancer (HCC/RAF)    ??? Diabetes (HCC/RAF)    ??? GERD (gastroesophageal reflux disease)    ??? Hypercholesteremia    ??? Hypertension    ??? PAD (peripheral artery disease) (HCC/RAF)    ??? 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 Prostate disease    ??? Scoliosis     adolesent   ??? Vascular disease        Past Surgical History     Past Surgical History:   Procedure Laterality Date ??? BACK SURGERY     ??? BILATERAL FEMORAL ARTERY EXPLORATION  2009   ??? BLADDER SURGERY  1999   ??? PROSTATE SURGERY  2000        Family History     Family History   Problem Relation Age of Onset   ??? Cancer Mother    ??? Cancer Father    ??? Malignant hyperthermia Neg Hx        Social History     States that Malikiah Debarr quit smoking in mid 90s; 2-3 packs per day for >20 years.  Denies marijuana use  Rare alcohol use  Denies any cocaine / methamphetamine use.  Lives in [place] with wife      Medications     Scheduled:    acyclovir 400 mg Oral TID   apixaban 5 mg Oral BID   budesonide-formoterol 2 puff Inhalation BID   busPIRone 15 mg Oral BID   carvedilol 25 mg Oral BID   clopidogrel 75 mg Oral Daily   docusate 100 mg Oral BID   DULoxetine 60 mg Oral Daily   furosemide 40 mg Oral Daily   gabapentin 800 mg Oral TID   losartan 100 mg Oral Daily   metFORMIN 1,000 mg Oral BID w/meals   pantoprazole 40 mg Oral Daily   polyethylene glycol  17 g Oral Daily   pravastatin 40 mg Oral QHS   pyridoxine 100 mg Oral Daily   senna 1 tablet Oral QHS   tiotropium 18 mcg Inhalation Daily      Continuous:     PRN:  albuterol, insulin aspart **AND** dextrose, diphenhydrAMINE, HYDROcodone-acetaminophen, LORazepam       Allergies   No Known Allergies     Physical Exam   Temp:  [36 ???C (96.8 ???F)-37.2 ???C (99 ???F)] 36 ???C (96.8 ???F)  Heart Rate:  [66-82] 76  Resp:  [15-22] 16  BP: (93-139)/(54-73) 106/64  NBP Mean:  [70-83] 83  SpO2:  [88 %-96 %] 90 %     Gen: Well-developed male in no acute distress  Eyes: PERRL, EOMI  ENT: OP clear, MMM  Neck: Supple, trachea midline, no cervical or supraclavicular lymphadenopathy  CV: RRR, normal S1S2, no m/r/g  Pulm: Prolonged expiratory phase with scattered wheezes throughout.  Crackles at RLL, adventitial sounds in LUL.  Abd: NABS, soft, flat, NT, no masses or organomegaly  Skin: Warm and dry, no rashes  Ext: No cyanosis or edema. Distal pulses 2+ and symmetric Neuro: A&Ox4, CNII-XII intact, motor 5/5 throughout, sensation grossly intact    Laboratory Data   CBC  Recent Labs      02/06/17   0531   WBC  9.77   HGB  14.0   HCT  44.1   MCV  88.2   PLT  122*     BMP  Recent Labs      02/08/17   0512  02/07/17   0545  02/06/17   0531   NA  136  137  138   K  4.6  4.2  5.2   CL  97  95*  98   CO2  31*  28  31*   BUN  29*  24*  23*   CREAT  1.16  0.92  1.01   CALCIUM  8.5*  8.5*  9.2   MG  1.3*  1.6  1.5     LFT  Recent Labs      02/06/17   0531   TOTPRO  6.1   ALBUMIN  3.2*   BILITOT  0.3   BILICON  <0.2   ALT  26   AST  27   ALKPHOS  51     Coags  Recent Labs      02/08/17   0512  02/07/17   2320  02/07/17   1755   APTT  114.8*  70.7*  36.3*        HIV negative 01/2017    Microbiology   Aspergillus Ag EIA pending  Aspergillus fumigatus (M3) IgE negative  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    Imaging/Studies     02/05/2017 CT Chest w/o contrast:  IMPRESSION:  Atherosclerotic calcification of the thoracic aorta, brachiocephalic, bilateral common carotid, bilateral subclavian and bilateral axillary, left anterior descending, circumflex and right coronary arteries. Enlargement of the central pulmonary arteries, the main pulmonary artery currently 39 mm in maximal axial diameter, with associated calcifications, consistent with pulmonary arterial hypertension. Small left pleural effusion.  Small pericardial effusion.  Moderate smoking-related centrilobular emphysema.  Coalescent airspace and nodular consolidation within the left upper lobe and lingula, and to a lesser extent, within the right upper and right lower lobes, consistent with pneumonia, likely fungal. Associated bulky intrathoracic multicompartmental lymphadenopathy; for example, AP window lymph node, currently 38 x 23 mm (3-40).    02/04/2017 CXR:  IMPRESSION:  Persistent airway thickening in the left lung and more confluent airspace opacity in the left perihilar region, more prominent in the upper lobe as well as airway thickening and patchy opacities in the right infrahilar/perihilar region, suspicious for   multifocal pneumonia without significant interval change, allowing for change in lung volumes. Follow-up to resolution suggested to exclude underlying mass lesion in the left perihilar region.  Small left and probable trace right pleural effusion. No pneumothorax.  Cardiomediastinal silhouette unchanged.    02/01/2017 CXR:  Increasing diffuse confluent airspace opacities within the left suprahilar region and perihilar region with interstitial prominence throughout the left lung and to lesser extent right lung concerning for multifocal aspiration/pneumonia.  Aortic calcification tortuosity with cardiomegaly.  No large effusions or significant pneumothorax.  Osteopenia and multilevel spondylotic maturation changes.    02/01/2017 TTE:   1. Technically difficult study due to poor acoustic windows.   2. Small LV size, moderate concentric left ventricular hypertrophy, hyperdynamic systolic function, mild LV diastolic dysfunction (grade I, impaired relaxation).   3. Left ventricular ejection fraction is approximately 70-75%.   4. Normal right ventricular size and normal systolic function.   5. There are no prior echocardiograms available for comparison.    Assessment   Ryan Shepard is a 76 y.o. male OPD (no PFT available, on tiotropium, budesonide-formoterol), extensive smoking history (>40 pack years), PAD s/p femoral graft (2009, on plavix qD), HTN, HLD, DM2, pAfib (on apixaban) GERD, depression / anxiety, chronic pain, who presented with worsened dyspnea and sputum production on 12/6 now improved following treatment for COPD exacerbation with levaquin, duonebs and 5 day course of oral steroids.    The pulmonology team is consulted for evaluation of nodular consolidation and thoracic lymphadenopathy on CT w/o contrast in an HIV negative male with significant tobacco exposure, performed in the setting of recent isolation of aspergillus and candida in sputum samples.   The patient is without known immunosuppression and per report has not been on chronic steroids outside of the hospital.  While the imaging could be consistent with pulmonary aspergillosis with reactive lymphadenopathy, the aspergillus isolated on sputum culture may also represent a benign colonizer.  Unfortunately, the patient is currently receiving apixaban and plavix, which would delay any attempts at biopsy (necessary for definitive histopathologic diagnosis of invasive aspergillosis) for > 5 days.  The patient's bronchoscopy with BAL is most concerning for pulmonary infection vs underlying malignancy and future biopsy of endobroncheal lesions and lymph nodes would be warranted.    Recommendations   - Okay to resume diet and restart anticoagulation.  - Follow-up bronchoscopy infectious workup / cytology and remaining fungal serologic work-up.  We would not recommend empiric anti-fungal therapy at this time.  - Appreciate infectious disease input and recommendations.  - Please have patient follow-up with Dr. Gearldine Bienenstock Kimley Apsey (scheduler [phone]) 1-2 weeks following discharge.  We will plan for a repeat CT chest without contrast within 4-6 weeks following discharge.   - Please titrate supplemental oxygen for goal SpO2 88-92%, given hyperoxia may worsen hypercapnia in diseased lungs like his due to worse V/Q mismatch. Please reinforce aggressive pulmonary toilette and  bronchodilator.     DWA Dr. Gearldine Bienenstock Tanner Vigna. Please page the on-call pulmonary consult fellow [phone]) with any questions or concerns.  We will continue to follow    Author:  Purvis Kilts. Daphane Shepherd, MD  [phone]  02/08/2017 11:45 AM     ATTENDING ADDENDUM NOTE:  Thank you for involving [facility] in the care of [name].  Patient seen, chart reviewed and case discussed with [facility]. Agree with the plan.       Gearldine Bienenstock Teria Khachatryan, MD [facility]  Interventional Pulmonology  Critical Care Medicine  Clinical Instructor of Medicine  [employer] at [facility] Interventional Pulmonology [phone]

## 2017-02-08 NOTE — Procedures
[facility]  BRONCHOSCOPY NOTE    PATIENT:  Ryan Shepard  MRN:  [id number]  DOB:  [birth year]  DATE OF PROCEDURE:  02/08/2017     TYPE OF PROCEDURE:  1. Laryngoscopy, fiber optic, diagnostic  2. Flexible bronchoscopy  3. Therapeutic aspiration of secretions  4. BAL of LUL and lingula    PRE-OPERATIVE DIAGNOSES:   1. Hypoxia  2. Abnormal CT chest      POST-OPERATIVE DIAGNOSES:   Same, s/p flexible bronchoscopy    ATTENDING PHYSICIAN: Gearldine Bienenstock Margaret Cockerill, MD PHD    CONSENT: Informed consent was obtained from the patient for the anticipated procedures.      SEDATION:  Managed by anesthesia team    FINDINGS:   Vocal cords: normal  Trachea: normal  Carina: normal  Right bronchial tree: normal  Left bronchial tree: some irregularity of left sided bronchial wall, especially LUL/lingula  Secretion: copious, yellow, thin      Normal vocal cords      Copious secretions       Endobronchial wall irregularity seen especially in LUL    PROCEDURE NOTE:   The patient was taken to the [facility] Medical Procedure Unit in awake condition. A time out was completed. Blood pressure, heart rate, pulse oximetry and cardiac telemetry were continuously monitored and the patient remained stable for the duration of the procedure. 5cc of 1% nebulized lidocaine was administered, along with 6 cc 1% lidocaine for gargling. A pulm mask was used along with a bite block. After adequate sedation, laryngoscopy with fiber optic guidance was done and the bronchoscope was introduced into the airway which were inspected in detail. Notable findings are described above. Therapeutic aspiration of secretion was done. A BAL was obtained from the LUL and lingula. Hemostasis was confirmed, secretions were cleared and the bronchoscope was removed.     COMPLICATION(S):   None.    ESTIMATED BLOOD LOSS:   None.    SPECIMEN(S):  1. BAL of LUL and lingula    IMPRESSION:  1. Successful flexible bronchoscopy with BAL    PLAN: 1. Follow up with culture/cytology of the BAL  2. It is expected that patient's oxygenation is worse than that prior to the bronchoscopy. Please reinforce pulmonary toilette and bronchodilators.   3. Please schedule an outpatient clinic visit with me ([phone]) 1-2 weeks after discharge and a repeat CT chest 4-6 weeks from the previous one to monitor disease progression.     Gearldine Bienenstock Coree Brame, MD PhD  Interventional Pulmonology  Pager: [phone]

## 2017-02-08 NOTE — Consults
INPATIENT INFECTIOUS DISEASE CONSULTATION    PATIENT:  Ryan Shepard  MRN:  [id number]  DOB:  1940-09-30  DATE OF ADMISSION: 02/01/2017    DATE OF SERVICE:  02/08/2017  REQUESTING PHYSICIAN: Internal Medicine  REASON FOR CONSULTATION: Aspergillus     Chief Complaint:  SOB    History of Present Illness:   Mr Ybarra is a 76 y/o M with COPD, tobacco use disorder, PAD s/p femoral graft, HTN, DM2, HLD, Afib, GERD, depression, anxiety, chronic pain presenting for intermittent SOB.     12/6: Admitted with a COPD exacerbation in the setting of diagnosed COPD and extensive tobacco use disorder. CXR on admission showing confluent airspace opacities c/f multifocal aspiration/pneumonia. Procal neg. Treated with levaquin (12/6- ) x 7 days and 5 day course of prednisone.   12/11: Patient was planned for discharge but then was found on bacterial resp culture from admission (12/6) to have aspergillus species, few probably candida. Other micro including legionella urine ag neg, MRSA nares neg, HIV neg. Had CT chest done showing airspace and nodular consolidation within the left upper lobe and lingula and lesser within the right upper and right lower lobes consistent with pneumonia, likely fungal. Also with bulky intrathoracic multicompartamental lymphadenopathy.     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 Started on acyclovir 800 mg BID x 5 days. Gonorrhea/chlamydia neg. RPR pending. Bacterial culture no growth to date. HIV neg.     12/12: VSS, no other acute changes. Requiring 3 L NC. No changes in respiratory status.   12/13: VSS. No acute changes. Pulmonology consulted, recommended bronch, which will be performed today. Pt states his SOB has improved drastically. No fevers, chills, nausea, vomiting. No new culture data.    Past Medical History:   Past Medical History:   Diagnosis Date   ??? Cancer (HCC/RAF) ??? Diabetes (HCC/RAF)    ??? GERD (gastroesophageal reflux disease)    ??? Hypercholesteremia    ??? Hypertension    ??? PAD (peripheral artery disease) (HCC/RAF)    ??? 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 Prostate disease    ??? Scoliosis     adolesent   ??? Vascular disease        Past Surgical History:  Past Surgical History:   Procedure Laterality Date   ??? BACK SURGERY     ??? BILATERAL FEMORAL ARTERY EXPLORATION  2009   ??? BLADDER SURGERY  1999   ??? PROSTATE SURGERY  2000       Allergies:   No Known Allergies    ID Medications:   Anti-infective Meds       Disp Refills Start End    acyclovir 800 mg tablet 8 tablet 0 02/05/2017     Sig - Route: Take 1 tablet (800 mg total) by mouth two (2) times daily. - Oral    Cosign for Ordering: Accepted by Pura Spice., MD on 02/05/2017  8:46 PM      Hospital Medications       Dose Frequency Start End    acyclovir tab 400 mg 400 mg 3 times daily 02/06/2017 02/13/2017    Sig - Route: Take 1 tablet (400 mg total) by mouth three (3) times daily. - Oral          Objective:  Vital Signs summary (past 24 hours):  Temp:  [36 ???  C (96.8 ???F)-37.2 ???C (99 ???F)] 36.8 ???C (98.2 ???F)  Heart Rate:  [66-82] 76  Resp:  [15-22] 20  BP: (93-139)/(54-73) 106/64  NBP Mean:  [70-83] 83  SpO2:  [88 %-96 %] 90 %  Temp (24hrs), Avg:36.6 ???C (97.9 ???F), Min:36 ???C (96.8 ???F), Max:37.2 ???C (99 ???F)    Physical Exam:   Gen: no distress lying in bed on nasal O2 3L, saturating 89-92  HEENT: PERRL, EOMI  CV: normal S1/S2, no murmurs   Pulmonary: scattered crackles at bases, otherwise clear, no wheezes  GI: soft, BS+, not tender to light palpation,   Skin: no rashes, normal turgor  Uro: multiple deroofed, mildly erythematous lesions where vesicles on the meatus and the shaft of the penis.   Neuro: awake, alert, moving all extremities  Psych: appropriate mood/affect, appropriate judgment/insight    Lab Tests/ Studies:  Recent Labs      02/06/17   0531   WBC  9.77   HGB  14.0   HCT  44.1 MCV  88.2   PLT  122*       Recent Labs      02/08/17   0512  02/07/17   0545  02/06/17   0531   NA  136  137  138   K  4.6  4.2  5.2   CL  97  95*  98   CO2  31*  28  31*   BUN  29*  24*  23*   CREAT  1.16  0.92  1.01   CALCIUM  8.5*  8.5*  9.2   MG  1.3*  1.6  1.5       Recent Labs      02/06/17   0531   TOTPRO  6.1   ALBUMIN  3.2*   BILITOT  0.3   BILICON  <0.2   ALT  26   AST  27   ALKPHOS  51       No results found for: Paulo Fruit, [place], GENTRAND, GENTTROUGH, AMIKPEAK, Alric Seton, TOBRARAND    Microbiology:   Aspergillus Ag EIA -  <0.50  Aspergillus fumigatus (M3) IgE - NEG   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+  Bacterial culture gram stain from vesicle - Coag neg staph like colonies, few enteroccocus like colonies  Cryptococcal Ag - neg  Cocci EIA/Ag - Negative  12/6 Sputum few Candida and Aspergillus species, few normal flora  MRSA screen negative    HIstoAg urine: NOT SENT  Histo antigen serum: NOT SENT  Beta glucan: Pending  MTB PCR: NOT SENT    HIV  - neg  Legionella Urinary Ag - neg  Chlamydia/gonorrhea PCR - neg  RPR nonreactive    Imaging reviewed by me:  CT chest w/o contrast 12/10 reviewed with staff radiologist  IMPRESSION:  Atherosclerotic calcification of the thoracic aorta, brachiocephalic, bilateral common carotid, bilateral subclavian and bilateral axillary, left anterior descending, circumflex and right coronary arteries. Enlargement of the central pulmonary arteries, the main pulmonary artery currently 39 mm in maximal axial diameter, with associated calcifications, consistent with pulmonary arterial hypertension. Small left pleural effusion. Small pericardial effusion.  Moderate smoking-related centrilobular emphysema.  Coalescent airspace and nodular consolidation within the left upper lobe and lingula, and to a lesser extent, within the right upper and right lower lobes, consistent with pneumonia, likely fungal. Associated bulky intrathoracic multicompartmental lymphadenopathy; for example, AP window lymph node, currently 38 x 23 mm (3-40).    Assessment:  Mr Liz  is a 76 y/o M with COPD, tobacco use d/o, PAD presenting with SOB and penile lesions.    #Aspergillus on resp cultures 12/6  #Vesciluar Penile lesions, HSV2+  #COPD Exacerbation   #Afib  #PAD  #HTN  #HLD  #GERD  #DM2    Aspergillosis can occur in less immunosuppressed hosts in the setting of underlying COPD receiving steroid therapy. However, this pt has NOT been taking systemic steroids other than a short recent 5 day course. Thus, given his lack of more potent risk factors, it seems more likely that this positive culture reflects a colonizer.  However, in the setting of the pts consolidations and intrathoracic lymphadenopathy, further workup is indicated to evaluate for other atypical organisms including fungal and mycobacterial causes. Malignancy is also a consideration given bulky lymphadenopathy and tobacco use history.     The aspergillus EIA is negative, and while not definitive is consistent with colonization rather than invasive infection. A beta glucan also will be helpful to offer any hint towards the unlikely possibility of an actual invasive aspergillus infection.  A biopsy remains the best test to look for invasive disease.  The Coccidiodes EIA also was negative.    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    Author:  Chrissie Noa. Achilles Dunk, MD 02/08/2017 2:27 PM  Internal Medicine, PGY-2    DWA Dr Charmian Muff   Patient seen and examined by me with Dr. Achilles Dunk.  We reviewed the laboratory and radiology data and formulated the treatment plan together.  The Aspergillus and Coccodioides EIAs were both negative.  Still no clear evidence to suggest and invasive fungal infection.  Lenon Curt, MD

## 2017-02-08 NOTE — Other
Patients Clinical Goal:   Clinical Goal(s) for the Shift: 02 sats >88%, NPO after midnight, maintain safety  Identify possible barriers to advancing the care plan: None  Stability of the patient: Moderately Stable - low risk of patient condition declining or worsening   End of Shift Summary: Pt denies SOB, 02 sats >88% at 3L/NC, spontaneous productive cough noted with yellowish secretions, Heparin drip increased to 1600 units/hr for APTT 70.7 then stopped at 0500, kept NPO after midnight for bronchoscopy at 0900 today, will continue to monitor, needs attended.

## 2017-02-08 NOTE — Progress Notes
Internal Medicine Progress Note      PMD: Ryan Mylar, MD  DATE OF SERVICE: 02/08/2017  HOSPITAL DAY: 7  CHIEF COMPLAINT:   Chief Complaint   Patient presents with   ??? Shortness of Breath     dx with pneumonia at OSH on antibiotics with clear withe phelgm Denies fever.        Last 24 Hour/Overnight Events   - no acute events overnight, afebrile, vss  - SpO2 >88% on 3L NC  - NPO since MN, heparin gtt held at 5 for BAL today    Today  - bronchoscopy with BAL of LUL and lingula  - patient hypoxic after procedure and placed on 15L face mask despite RT  - soft BP given recent propofol, pt was given BP meds at noon after procedure   - s/p 250 cc NS     Subjective/Review of Systems   Patient feels SOB after procedure but not dizzy and mentation well.     2-point ROS negative except as outlined above.    Medications     Scheduled:    acyclovir 400 mg Oral TID   budesonide-formoterol 2 puff Inhalation BID   busPIRone 15 mg Oral BID   carvedilol 25 mg Oral BID   clopidogrel 75 mg Oral Daily   docusate 100 mg Oral BID   DULoxetine 60 mg Oral Daily   furosemide 40 mg Oral Daily   gabapentin 800 mg Oral TID   losartan 100 mg Oral Daily   magnesium sulfate 6 g Intravenous Once   metFORMIN 1,000 mg Oral BID w/meals   pantoprazole 40 mg Oral Daily   polyethylene glycol 17 g Oral Daily   pravastatin 40 mg Oral QHS   pyridoxine 100 mg Oral Daily   senna 1 tablet Oral QHS   tiotropium 18 mcg Inhalation Daily      Continuous:     PRN:  albuterol, insulin aspart **AND** dextrose, diphenhydrAMINE, HYDROcodone-acetaminophen, LORazepam      Physical Exam   Temp:  [36 ???C (96.8 ???F)-37.2 ???C (99 ???F)] 37 ???C (98.6 ???F)  Heart Rate:  [66-82] 73  Resp:  [18-22] 18  BP: (102-156)/(54-64) 109/62  NBP Mean:  [70-89] 76  SpO2:  [88 %-96 %] 91 %   UOP: Urine:  [900 mL] 900 mL I/Os:   Intake/Output Summary (Last 24 hours) at 02/08/17 0700  Last data filed at 02/08/17 0400   Gross per 24 hour   Intake              884 ml   Output              900 ml Net              -16 ml     General: ???76 yo AA well-developed???male???in no acute distress, appears comfortabe   Eyes: PERRL, EOMI  HEENT: OP clear, MMM, NC in place (2L)  CV: RRR, normal S1S2, no m/r/g  Pulm: improved crackles bilaterally, mild audible wheezing. No rhonchi.   Abd: NABS, soft, flat, NT, no masses or organomegaly  Skin: Warm and dry, no rashes  Ext: trace edema  Neuro: A&Ox3, CNII-XII intact, motor 5/5 throughout, sensation grossly intact    Laboratory Data   CBC:  Recent Labs      02/06/17   0531   WBC  9.77   NEUTABS  7.08*   HGB  14.0   HCT  44.1   MCV  88.2   PLT  122*     BMP:  Recent Labs      02/08/17   0512  02/07/17   0545  02/06/17   0531   NA  136  137  138   K  4.6  4.2  5.2   CL  97  95*  98   CO2  31*  28  31*   BUN  29*  24*  23*   CREAT  1.16  0.92  1.01   GLUCOSE  115*  151*  146*   CALCIUM  8.5*  8.5*  9.2   MG  1.3*  1.6  1.5     LFT:  Recent Labs      02/06/17   0531   TOTPRO  6.1   ALBUMIN  3.2*   BILITOT  0.3   BILICON  <0.2   ALT  26   AST  27   ALKPHOS  51     COAGS:  Recent Labs      02/08/17   0512  02/07/17   2320  02/07/17   1755   APTT  114.8*  70.7*  36.3*       Microbiology   Aspergillus Fumigatus 02/05/17: <0.10  Cryptococcal Ag blood 02/06/17: negative  Bacterial Culture Respiratory 02/01/17: Few Probable candida albicans , Aspergillus [place]???  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   Sputum screen 02/01/17: <10 Epithelial cells per low power field   Resp bacterial Cx 02/01/17: <10 Epithelial cells per low power field   Legionella urine 02/01/17: negative    Imaging   Bronchoscopy 02/08/17  FINDINGS:   Vocal cords: normal  Trachea: normal  Carina: normal  Right bronchial tree: normal Left bronchial tree: some irregularity of left sided bronchial wall, especially LUL/lingula  Secretion: copious, yellow, thin  ???  Assessment/Plan   Ryan Shepard is a 76 y.o. male???with COPD, hx of tobacco use disorder, PAD s/p femoral graft, HTN, DM2, HLD, A.fib, GERD, depression, anxiety, chronic pain, who presents with intermittent SOB and LE edema???for the past week. Patient has polypharmacy and needs good outpatient med reconciliation. Patient would like to transfer his care to [facility] and be set up with a new PCP.     #acute hypoxic respiratory failure  S/p bronchoscopy with BAL 02/08/17  - Scheduled duonebs q4h  - on facemask FiO2 40%, 15L  - aerobika, chest PT, deep suctioning  - Trend VBG  - low threshold for MICU transfer if oxygenation not improved    #hypotension  SBP 80s, asymptomatic, likely iatrogenic given recent sedation during bronchoscopy and patient was given BP meds upon arrival to floor shortly after procedure, NPO for prolonged time period  - 250 cc NS bolus x 2  - NS @50cc /hr  - holding losartan and carvedilol     #Pneumonia  positive aspirgillus on resp cx and characteristics of consilidation on CT raises concern for fungal pneumonia.   - ID following  - Pulm consulted, appreciate rec's:   - s/p bronchoscopy with BAL this AM, follow analysis   - fu beta-D glucan and galctomannan assays   - fu fungal studies (histoplasma quant gold)  - pt will neef follow up with Dr. Larae Shepard He 1-2 wks post discharge with plans to repeat CT w/o contrast within 6 weeks post discharge and if evidence  of ongoing LAD or consolidation pulm will plan for possible repeat bronchoscopy with biopsy to evaluate underlying malignancy   ???  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 Tender vesicles seen on penile meatus on exam, patient reports ongoing x 1 day; on re-disussion with patient, denies previous episodes.  - acyclovir 400mg  po TID x 7 days (first episode) (12/10-12/17)  - follow C/G urine PCR - obtain HIV screen (verbal consent obtained from patient)   ???  # acute on chronic respiratory failure   #grade 1 diastolic dysfunction   DDx:???COPD exacerbation, community vs hospital acquired pneumonia, less likely pulmonary congestion 2/2 CHF. Patient has an extensive tobacco use history with dx COPD and was recently admitted to OSH in [place] for PNA (undertreated augmentin not great for atypicals)  CXR 02/01/17 revealed increasing diffuse confluent airspace opacities within the left suprahilar region and perihilar region with interstitial prominence throughout the left lung and to lesser extent right lung concerning for multifocal aspiration/pneumonia. Aortic calcification tortuosity with cardiomegaly.  procalcitonin <0.1. Weight downtrending.   PE: crackles bilaterally, 3+ LE edema, Bedside U/S revealed IVC of 2.7 cm, non-collapsable and RIJ ~ 6-7 cm, non-collapsable   - TTE 02/01/17 revealed grade 1 diastolic dysfunction, LVEF 70-75%  - strict ins/outs   - daily weights (103.4 kg on admission)  - HOLD PO Lasix 40 mg daily   - s/p PO levofloxacin 750 mg daily, plan for 7-day course last day 12/12  - keep SpO2 >???88 (on NC --> okay to use face mask)  - symbicort 2 puffs BID with teaching   - s/p Prednisone 40 mg PO daily x 5 days (12/6-12/10) last day today  ???  #hx of Afib  - continuous cardiac monitoring   - HOLD home carvedilol 25 mg daily ???BID   - restart  apixaban 5 mg BID    ???  #PAD: chronic/stable  PAD s/p femoral-femoral graft (2009)  - continue clopidogrel 75 mg daily   ???  #HTN chronic/improved, now hypotensive as above  SBP 130s-170s, on home losartan-HCTZ (non-formulary), Cr stable  - HOLD losartan 100 mg daily  & coreg 25mg  BID  ???  #HLD chronic/stable  - continue home pravastatin 40 mg nightly   ???  #GERD chronic/stable  - continue pantoprazole 40 mg daily   ???  #DM2 chronic/stable  - hold home metformin   - accuchecks (GLC 125-240), patient on prednisone  - insulin sliding scale algorithm #3 - continue home gabapentin 800 mg ???TID   ???  #depression   #anxiety  - continue buspirone 15 mg BID  - continue home duloxetine DR 60   - continue home lorazepam 2 mg PRN BID  ???  #chornic pain  Per patient ongoing lower back and lower extremity pain for several years  - continue home Norco 10-325 mg q6h PRN   - schedule with outpatient [facility] management at [facility]   ???  Inpatient Checklist:  FEN/GI:???  Diet: NPO  Analgesia:???Norco  Sedation:???N/A  DVT Prophylaxis: on home apixaban and clopidogrel   GI Prophylaxis: on PPI  Glycemic control:???insulin aspart algorithm #2  Bowel regimen:???NA  ???  Code Status: Full Code  Contact: ???Primary Emergency Contact: Wilmot, Quevedo (wife), Home Phone: [phone]  ???  DWA Vertis Kelch., MD  ???  Author:  Bascom Levels, MD  [phone]  02/08/2017     I saw and evaluated Ryan Shepard.  I have reviewed 24hr interval labs, radiology, and notes. I discussed the case with the  resident and agree with the findings and plan of care as documented in the resident's note with the following additions/modifications.  I evaluated the patient at bedside numerous times throughout the day. Post bronch, he desatted as high as low 60s. On my exam at that time, he had increased rhonchi throughout his R thorax. I called a rapid response. RT at bedside and gave stat albuterol & atrovent nebs.  Pt was placed on 15L NRB. With nebs, he was able to saturate ~90% on 10L NRB. Stat CXR obtained and largely unchanged per my read. Stat VBG obtained and notable for worsening respiratory acidosis.  I again evaluated the patient with the pulmonary consult team at bedside.  They recommended aerobika, chest PT, RTC nebs, deep suctioning which were all ordered. Around 5pm the patient was on facemask at 15L again (although likely getting closer to 10L since not on NRB). His rhonchi had improved but it was concerning that his O2 saturation had not significantly improved with multiple therapies. I consulted the ICU for consideration of admission for closer monitoring and we saw the patient together at bedside. The patient and his wife were updated at bedside. Awaiting recs. In terms of hypotension, most likely 2/2 BP agents, dehydration from NPO status, and propofol during bronch. Giving small boluses. D/c'ed all BP agents. D/c metformin and keep NPO except meds with aspiration precautions due to tenuous respiratory status. Throughout this time, pt has been AAOx4, without lightheadedness or chest pain. Does c/o SOB. Has no retractions or signs of resp distress on exam.  Monitoring blood gas, f/u ICU, HFNC on standby.     Greater than 80 minutes was spent caring for this critically ill patient including time spent at the patients bedside, reviewing old records, treatment of their vital organ failure, and preventing further life threatening deterioration in the patients condition.     Signed: Edwina Grossberg C. Foster Simpson, MD

## 2017-02-08 NOTE — Consults
Per IDR, awaiting BAL- poss d/c tomorrow vs Friday.   HH has been secured and pt has home oxygen.     Msg sent to [facility] updating them pt is still inpatient.    Leslee Home CM, RN BSN  Medicine Team D  P: [phone]

## 2017-02-09 LAB — Differential Automated
ABSOLUTE MONO COUNT: 0.93 10*3/uL — ABNORMAL HIGH (ref 0.20–0.80)
BASOPHIL PERCENT, AUTO: 0.3 (ref 1.30–3.40)

## 2017-02-09 LAB — Hepatic Funct Panel: TOTAL PROTEIN: 5.8 g/dL — ABNORMAL LOW (ref 6.1–8.2)

## 2017-02-09 LAB — Glucose,POC
GLUCOSE,POC: 97 mg/dL (ref 65–99)
GLUCOSE,POC: 87 mg/dL (ref 65–99)
GLUCOSE,POC: 116 mg/dL — ABNORMAL HIGH (ref 65–99)

## 2017-02-09 LAB — Bacterial Culture Respiratory

## 2017-02-09 LAB — B-Type Natriuretic Peptide: BNP: 61 pg/mL (ref <100)

## 2017-02-09 LAB — Blood Gases,venous
BASE EXCESS: 4 mmol/L
O2 SATURATION/MEASURED: 66.1
O2 SATURATION/MEASURED: 63.8
PO2,VENOUS: 43 mmHg
VBG INSPIRED O2: 3 mmol/L

## 2017-02-09 LAB — Magnesium
MAGNESIUM: 2.1 meq/L — ABNORMAL HIGH (ref 1.4–1.9)
MAGNESIUM: 1.8 meq/L (ref 1.4–1.9)

## 2017-02-09 LAB — Fungal Culture Respiratory

## 2017-02-09 LAB — CBC
MCH CONCENTRATION: 32 g/dL (ref 31.5–35.5)
PLATELET COUNT, AUTO: 118 10*3/uL — ABNORMAL LOW (ref 143–398)

## 2017-02-09 LAB — Basic Metabolic Panel
CALCIUM: 8.9 mg/dL (ref 8.6–10.4)
GFR ESTIMATE FOR AFRICAN AMERICAN: 72 mmol/L (ref 135–146)
GFR ESTIMATE FOR AFRICAN AMERICAN: 85 mmol/L (ref 20–30)
GFR ESTIMATE FOR NON-AFRICAN AMERICAN: 73 mg/dL (ref 7–22)

## 2017-02-09 LAB — Blood Lactate: BLOOD LACTATE: 19 mg/dL (ref 5–25)

## 2017-02-09 LAB — Acid-Fast Culture and Stain, Respiratory
ACID-FAST STAIN (FLUOROCHROME): NONE SEEN
ACID-FAST STAIN (FLUOROCHROME): NONE SEEN

## 2017-02-09 LAB — Procalcitonin: PROCALCITONIN: <0.1 ug/L (ref <0.10)

## 2017-02-09 LAB — Phosphorus: PHOSPHORUS: 3.4 mg/dL (ref 2.3–4.4)

## 2017-02-09 MED ADMIN — IPRATROPIUM BROMIDE 0.02 % IN SOLN: 500 ug | RESPIRATORY_TRACT | @ 08:00:00 | Stop: 2017-02-19 | NDC 00487980101

## 2017-02-09 MED ADMIN — APIXABAN 5 MG PO TABS: 5 mg | ORAL | @ 04:00:00 | Stop: 2017-02-13 | NDC 00003089431

## 2017-02-09 MED ADMIN — METFORMIN HCL 500 MG PO TABS: 1000 mg | ORAL | @ 02:00:00 | Stop: 2017-02-09

## 2017-02-09 MED ADMIN — CLOPIDOGREL BISULFATE 75 MG PO TABS: 75 mg | ORAL | @ 16:00:00 | Stop: 2017-02-13 | NDC 00904629461

## 2017-02-09 MED ADMIN — VORICONAZOLE 250 ML IVPB (>500 MG): 530 mg | INTRAVENOUS | @ 19:00:00 | Stop: 2017-02-09

## 2017-02-09 MED ADMIN — PIPERACILLIN-TAZOBACTAM 3.375 G/50 ML RTU: 3.375 g | INTRAVENOUS | @ 06:00:00 | Stop: 2017-02-09 | NDC 00206886102

## 2017-02-09 MED ADMIN — HYDROCODONE-ACETAMINOPHEN 5-325 MG PO TABS: 2 | ORAL | @ 17:00:00 | Stop: 2017-02-22 | NDC 00406012362

## 2017-02-09 MED ADMIN — GABAPENTIN 400 MG PO CAPS: 800 mg | ORAL | @ 20:00:00 | Stop: 2017-02-22 | NDC 68084077401

## 2017-02-09 MED ADMIN — ALBUTEROL SULFATE (2.5 MG/3ML) 0.083% IN NEBU: 2.5 mg | RESPIRATORY_TRACT | @ 04:00:00 | Stop: 2017-02-14 | NDC 00487950101

## 2017-02-09 MED ADMIN — VORICONAZOLE 250 ML IVPB (>500 MG): 530 mg | INTRAVENOUS | @ 19:00:00 | Stop: 2017-02-09 | NDC 47781046671

## 2017-02-09 MED ADMIN — ALBUTEROL SULFATE (2.5 MG/3ML) 0.083% IN NEBU: 2.5 mg | RESPIRATORY_TRACT | @ 12:00:00 | Stop: 2017-02-14 | NDC 00487950101

## 2017-02-09 MED ADMIN — GABAPENTIN 400 MG PO CAPS: 800 mg | ORAL | @ 15:00:00 | Stop: 2017-02-22 | NDC 68084077401

## 2017-02-09 MED ADMIN — GABAPENTIN 400 MG PO CAPS: 800 mg | ORAL | @ 03:00:00 | Stop: 2017-02-22 | NDC 68084077401

## 2017-02-09 MED ADMIN — BUSPIRONE HCL 5 MG PO TABS: 15 mg | ORAL | @ 16:00:00 | Stop: 2017-02-21 | NDC 16729020001

## 2017-02-09 MED ADMIN — IPRATROPIUM BROMIDE 0.02 % IN SOLN: 500 ug | RESPIRATORY_TRACT | @ 21:00:00 | Stop: 2017-02-19 | NDC 00487980101

## 2017-02-09 MED ADMIN — DULOXETINE HCL 60 MG PO CPEP: 60 mg | ORAL | @ 16:00:00 | Stop: 2017-02-22 | NDC 68084067521

## 2017-02-09 MED ADMIN — PIPERACILLIN-TAZOBACTAM 3.375 GM/50 ML RTU (EXT 4HR): 3.375 g | INTRAVENOUS | @ 19:00:00 | Stop: 2017-02-11 | NDC 00206886102

## 2017-02-09 MED ADMIN — IPRATROPIUM BROMIDE 0.02 % IN SOLN: 500 ug | RESPIRATORY_TRACT | @ 16:00:00 | Stop: 2017-02-19 | NDC 00487980101

## 2017-02-09 MED ADMIN — SODIUM CHLORIDE 0.9 % IV BOLUS: 250 mL | INTRAVENOUS | @ 02:00:00 | Stop: 2017-02-09 | NDC 00338630402

## 2017-02-09 MED ADMIN — IPRATROPIUM BROMIDE 0.02 % IN SOLN: 500 ug | RESPIRATORY_TRACT | @ 04:00:00 | Stop: 2017-02-19 | NDC 00487980101

## 2017-02-09 MED ADMIN — PANTOPRAZOLE SODIUM 40 MG PO TBEC: 40 mg | ORAL | @ 16:00:00 | Stop: 2017-02-22 | NDC 68084081309

## 2017-02-09 MED ADMIN — ALBUTEROL SULFATE (2.5 MG/3ML) 0.083% IN NEBU: 2.5 mg | RESPIRATORY_TRACT | @ 21:00:00 | Stop: 2017-02-14 | NDC 00487950101

## 2017-02-09 MED ADMIN — DOCUSATE SODIUM 100 MG PO CAPS: 100 mg | ORAL | @ 03:00:00 | Stop: 2017-02-22 | NDC 00904645561

## 2017-02-09 MED ADMIN — ALBUTEROL SULFATE (2.5 MG/3ML) 0.083% IN NEBU: 2.5 mg | RESPIRATORY_TRACT | @ 16:00:00 | Stop: 2017-02-14 | NDC 00487950101

## 2017-02-09 MED ADMIN — IPRATROPIUM BROMIDE 0.02 % IN SOLN: 500 ug | RESPIRATORY_TRACT | @ 12:00:00 | Stop: 2017-02-19 | NDC 00487980101

## 2017-02-09 MED ADMIN — ACYCLOVIR 400 MG PO TABS: 400 mg | ORAL | @ 20:00:00 | Stop: 2017-02-10 | NDC 00904579061

## 2017-02-09 MED ADMIN — APIXABAN 5 MG PO TABS: 5 mg | ORAL | @ 16:00:00 | Stop: 2017-02-13 | NDC 00003089431

## 2017-02-09 MED ADMIN — BUSPIRONE HCL 5 MG PO TABS: 15 mg | ORAL | @ 05:00:00 | Stop: 2017-02-21 | NDC 16729020001

## 2017-02-09 MED ADMIN — PYRIDOXINE HCL 50 MG PO TABS: 100 mg | ORAL | @ 16:00:00 | Stop: 2017-02-22 | NDC 00536440801

## 2017-02-09 MED ADMIN — SENNOSIDES 8.6 MG PO TABS: 1 | ORAL | @ 03:00:00 | Stop: 2017-02-21 | NDC 00904652261

## 2017-02-09 MED ADMIN — VANCOMYCIN 250 ML IVPB: 1.25 g | INTRAVENOUS | @ 16:00:00 | Stop: 2017-02-10 | NDC 47781059791

## 2017-02-09 MED ADMIN — HYDROCODONE-ACETAMINOPHEN 5-325 MG PO TABS: 2 | ORAL | @ 03:00:00 | Stop: 2017-02-22 | NDC 00406012362

## 2017-02-09 MED ADMIN — SODIUM CHLORIDE 0.9 % IV SOLN: 50 mL/h | INTRAVENOUS | @ 03:00:00 | Stop: 2017-02-10 | NDC 00338961212

## 2017-02-09 MED ADMIN — ACYCLOVIR 400 MG PO TABS: 400 mg | ORAL | @ 04:00:00 | Stop: 2017-02-10 | NDC 00904579061

## 2017-02-09 MED ADMIN — ALBUTEROL SULFATE (2.5 MG/3ML) 0.083% IN NEBU: 2.5 mg | RESPIRATORY_TRACT | @ 08:00:00 | Stop: 2017-02-14 | NDC 00487950101

## 2017-02-09 MED ADMIN — PIPERACILLIN-TAZOBACTAM 3.375 GM/50 ML RTU (EXT 4HR): 3.375 g | INTRAVENOUS | @ 10:00:00 | Stop: 2017-02-11 | NDC 00206886102

## 2017-02-09 MED ADMIN — VANCOMYCIN 250 ML IVPB: 1.25 g | INTRAVENOUS | @ 06:00:00 | Stop: 2017-02-10 | NDC 47781059791

## 2017-02-09 MED ADMIN — VORICONAZOLE 250 ML IVPB (>500 MG): 530 mg | INTRAVENOUS | @ 05:00:00 | Stop: 2017-02-09 | NDC 47781046671

## 2017-02-09 MED ADMIN — BUDESONIDE-FORMOTEROL FUMARATE 160-4.5 MCG/ACT IN AERO: 2 | RESPIRATORY_TRACT | @ 16:00:00 | Stop: 2017-02-22 | NDC 00186037028

## 2017-02-09 MED ADMIN — BUDESONIDE-FORMOTEROL FUMARATE 160-4.5 MCG/ACT IN AERO: 2 | RESPIRATORY_TRACT | @ 04:00:00 | Stop: 2017-02-22 | NDC 00186037028

## 2017-02-09 MED ADMIN — IPRATROPIUM BROMIDE 0.02 % IN SOLN: 500 ug | RESPIRATORY_TRACT | Stop: 2017-02-19 | NDC 00487980101

## 2017-02-09 MED ADMIN — DOCUSATE SODIUM 100 MG PO CAPS: 100 mg | ORAL | @ 16:00:00 | Stop: 2017-02-22 | NDC 00904645561

## 2017-02-09 MED ADMIN — HYDROCODONE-ACETAMINOPHEN 5-325 MG PO TABS: 2 | ORAL | @ 10:00:00 | Stop: 2017-02-22 | NDC 00406012362

## 2017-02-09 MED ADMIN — PRAVASTATIN SODIUM 40 MG PO TABS: 40 mg | ORAL | @ 03:00:00 | Stop: 2017-02-22 | NDC 51079078220

## 2017-02-09 MED ADMIN — ALBUTEROL SULFATE (2.5 MG/3ML) 0.083% IN NEBU: 2.5 mg | RESPIRATORY_TRACT | Stop: 2017-02-14 | NDC 00487950101

## 2017-02-09 MED ADMIN — POLYETHYLENE GLYCOL 3350 17 G PO PACK: 17 g | ORAL | @ 16:00:00 | Stop: 2017-02-22

## 2017-02-09 MED ADMIN — ACYCLOVIR 400 MG PO TABS: 400 mg | ORAL | @ 15:00:00 | Stop: 2017-02-10 | NDC 00904579061

## 2017-02-09 NOTE — Progress Notes
INPATIENT INFECTIOUS DISEASE CONSULTATION    PATIENT:  Ryan Shepard  MRN:  [id number]  DOB:  [birth year]  DATE OF ADMISSION: 02/01/2017    DATE OF SERVICE:  02/09/2017  REQUESTING PHYSICIAN: Internal Medicine  REASON FOR CONSULTATION: Aspergillus     Chief Complaint:  SOB    History of Present Illness:   Ryan Shepard is a 76 y/o M with COPD, tobacco use disorder, PAD s/p femoral graft, HTN, DM2, HLD, Afib, GERD, depression, anxiety, chronic pain presenting for intermittent SOB.     12/6: Admitted with a COPD exacerbation in the setting of diagnosed COPD and extensive tobacco use disorder. CXR on admission showing confluent airspace opacities c/f multifocal aspiration/pneumonia. Procal neg. Treated with levaquin (12/6- ) x 7 days and 5 day course of prednisone.   12/11: Patient was planned for discharge but then was found on bacterial resp culture from admission (12/6) to have aspergillus species, few probably candida. Other micro including legionella urine ag neg, MRSA nares neg, HIV neg. Had CT chest done showing airspace and nodular consolidation within the left upper lobe and lingula and lesser within the right upper and right lower lobes consistent with pneumonia, likely fungal. Also with bulky intrathoracic multicompartamental lymphadenopathy.     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 Started on acyclovir 800 mg BID x 5 days. Gonorrhea/chlamydia neg. RPR pending. Bacterial culture no growth to date. HIV neg.     12/12: VSS, no other acute changes. Requiring 3 L NC. No changes in respiratory status.   12/13: VSS. No acute changes. Pulmonology consulted, recommended bronch, which will be performed today. Ryan states his SOB has improved drastically. No fevers, chills, nausea, vomiting. No new culture data.  12/14 Events noted; the patient became increasingly hypoxic after the bronchoscopy despite using a 15 liter face mask.  The blood also noted hypercapnia.  According to the notes the patient reported worsening cough associated blood tinged sputum, but today he claims there was no change in his breath or cough.  He was transferred to ICU for further monitoring and start on voriconazole, piperacillin-tazobactam and vancomycin.  He denies fever, chills chest pain or changes in his breathing.      Allergies:   No Known Allergies    ID Medications:   Anti-infective Meds       Disp Refills Start End    acyclovir 800 mg tablet 8 tablet 0 02/05/2017     Sig - Route: Take 1 tablet (800 mg total) by mouth two (2) times daily. - Oral    Cosign for Ordering: Accepted by Pura Spice., MD on 02/05/2017  8:46 PM      Hospital Medications       Dose Frequency Start End    acyclovir tab 400 mg 400 mg 3 times daily 02/06/2017 02/13/2017    Sig - Route: Take 1 tablet (400 mg total) by mouth three (3) times daily. - Oral    piperacillin/tazobactam 3.375 g in dextrose 5% 50 mL IVPB RTU 3.375 g Every 8 hours provider specified 02/08/2017 02/16/2017    Sig - Route: Inject 50 mLs (3.375 g total) into the vein every eight (8) hours. - Intravenous    Linked Group 1:  ''And'' Linked Group Details        vancomycin 1.25 g in dextrose 5% 250 mL IVPB 1.25 g Every 12 hours 02/08/2017 02/15/2017  Sig - Route: Inject 1.25 g into the vein every twelve (12) hours. - Intravenous    voriconazole 530 mg in dextrose 5% 250 mL IVPB 6 mg/kg ??? 88.9 kg (Adjusted) Every 12 hours 02/08/2017 02/09/2017    Sig - Route: Inject 530 mg into the vein every twelve (12) hours. - Intravenous          Objective:  Vital Signs summary (past 24 hours):  Temp:  [36.2 ???C (97.2 ???F)-36.8 ???C (98.2 ???F)] 36.7 ???C (98.1 ???F)  Heart Rate:  [67-82] 71  Resp:  [12-24] 20  BP: (80-128)/(56-81) 113/70  NBP Mean:  [67-90] 85  FiO2 (%):  [40 %-45 %] 45 %  SpO2:  [87 %-95 %] 91 %  Temp (24hrs), Avg:36.6 ???C (97.8 ???F), Min:36.2 ???C (97.2 ???F), Max:36.8 ???C (98.2 ???F)    Physical Exam:   Gen: lying in bed on high-flow nasal O2, saturating 87-95%  HEENT: PERRL, EOMI  CV: normal S1/S2, no murmurs   Pulmonary: decreased breath sounds on left with rhonchi, clearer on right  GI: soft, BS+, not tender to light palpation,   Skin: no rashes, normal turgor  Neuro: awake, alert, moving all extremities  Psych: appropriate mood/affect, appropriate judgment/insight    Lab Tests/ Studies:  Recent Labs      02/09/17   0559  02/08/17   1855   WBC  6.92  8.58   HGB  13.1*  13.3*   HCT  40.9  40.8   MCV  88.9  90.1   PLT  109*  118*       Recent Labs      02/09/17   0309  02/08/17   1855  02/08/17   0512   NA  138  137  136   K  4.4  4.8  4.6   CL  98  96  97   CO2  29  31*  31*   BUN  21  27*  29*   CREAT  1.00  1.15  1.16   CALCIUM  8.7  8.9  8.5*   MG  1.8  2.1*  1.3*   PHOS   --   3.4   --        Recent Labs      02/08/17   1855   TOTPRO  5.8*   ALBUMIN  2.8*   BILITOT  0.4   BILICON  <0.2   ALT  18   AST  20   ALKPHOS  50       No results found for: Antoine Primas  12/14 Lactate 19    Microbiology:   12/13 respiratory culture: yeast, otherwise negative so far; AFB smear negative  12/13 legionella culutre pending  Aspergillus Ag EIA -  <0.50  RVP panel negative  Aspergillus fumigatus (M3) IgE - NEG   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+  Bacterial culture gram stain from vesicle - Coag neg staph like colonies, few enteroccocus like colonies  Cryptococcal Ag - neg  Cocci EIA/Ag - Negative  12/6 Sputum few Candida and Aspergillus species, few normal flora  MRSA screen negative    HIstoAg urine: NOT SENT  Histo antigen serum: NOT SENT  Beta glucan: Pending  MTB PCR: NOT SENT    HIV  - neg  Legionella Urinary Ag - neg  Chlamydia/gonorrhea PCR - neg  RPR nonreactive    Imaging reviewed:  12/13 Chest x-ray: There are large masslike  areas of confluent consolidation in the left superhilar region and extensive mediastinal adenopathy. There are less well-defined areas of airway thickening consolidation throughout the remainder of the left lung and at the right   lung base    CT chest w/o contrast 12/10 reviewed with staff radiologist  IMPRESSION:  Atherosclerotic calcification of the thoracic aorta, brachiocephalic, bilateral common carotid, bilateral subclavian and bilateral axillary, left anterior descending, circumflex and right coronary arteries. Enlargement of the central pulmonary arteries, the main pulmonary artery currently 39 mm in maximal axial diameter, with associated calcifications, consistent with pulmonary arterial hypertension. Small left pleural effusion. Small pericardial effusion.  Moderate smoking-related centrilobular emphysema.  Coalescent airspace and nodular consolidation within the left upper lobe and lingula, and to a lesser extent, within the right upper and right lower lobes, consistent with pneumonia, likely fungal. Associated bulky intrathoracic multicompartmental   lymphadenopathy; for example, AP window lymph node, currently 38 x 23 mm (3-40).    Assessment:  Ryan Shepard is a 75 y/o M with COPD, tobacco use d/o, PAD presenting with SOB and penile lesions.    #Hypoxia and respiratory failure  #Aspergillus on resp cultures 12/6  #Vesciluar Penile lesions, HSV2+  #COPD Exacerbation   #Afib  #PAD  #HTN  #HLD  #GERD  #DM2    No clear evidence for an infectious cause for his respiratory decompensation post-procedure.  The chest x-ray is relatively unchanged for December 9th and he is afebrile without a leukocytosis.  Reasonable to consider a brief period of broad spectrum therapy while awaiting the latest culture results.  The combination of piperacillin-tazobactam and vancomycin has been associate with an increased risk of renal toxicity.  As the patient's MRSA nasal screen is negative, it is unlikely that he has MRSA pneumonia. Recommendations / Plan:  - Consider stopping vancomycin--negative MRSA nasal screen and associated real toxicity when combined with piperacillin-tazobactam  Continue piperacillin-tazobactam and voriconazole pending repeat cultures, but suspect can stop soon  - F/u aspergillus EIA, aspergillus fumigatus IgE  Follow up BAL cultures  - Continue acyclovir 400 mg TID x 2 more days for genital herpes (end 12/16)    Please page General ID pager [phone]) with any additional questions.  Dr. Cato Mulligan is covering the ID service starting 12/15.    Author:  Sigmund Hazel. Charmian Muff, MD 02/09/2017 11:09 AM

## 2017-02-09 NOTE — Consults
Patient transferred to the MICU for a higher level of care, currently on HFNC 40%/40L oxygen. No weekend discharege is anticipated.

## 2017-02-09 NOTE — Other
Patients Clinical Goal:   Clinical Goal(s) for the Shift: Admit to 4ICU. SpO2 >88%. Maintain hemodynamic stability. Maintain pt comfort and safety.  Identify possible barriers to advancing the care plan: respiratory failure  Stability of the patient: Moderately Unstable - medium risk of patient condition declining or worsening    End of Shift Summary:   1) Pt admitted last night at 1955. Pt remained on HFNC 40% 45L to keep Sats >88%. Unable to wean FiO2 overnight as pt was saturating 88-92%. Latest VBG 7.32/66/38/32.08/31/61.8. MDs aware.   2) Pt hemodynamically stable overnight. SBP 80s-100s, maintained MAP >60.   3) Comfort and safety maintained. PRN norco given x1 for generalized pain per pt request. Pt A/O x4 throughout shift.

## 2017-02-09 NOTE — Other
Patients Clinical Goal:   Clinical Goal(s) for the Shift: VSS, no desaturation, Spo2 >88%, bronch today   Identify possible barriers to advancing the care plan:   Stability of the patient: Moderately Unstable - medium risk of patient condition declining or worsening    End of Shift Summary: Patient awake, alert and oriented x4, ambulatory with minimal assist and walker. Patient to bronchoscopy this AM, left the floor at 0830.     Patient returned to the floor at 1030. Awake, alert and oriented x4 on NC 3-4L, stated that he wanted to eat; MD notified for diet order and to release MAR. Patient started to desaturate at 1230, MD notified and at bedside. RT at bedside, patient able to recover with non-rebreather. VBG done, CXR done. Patient switched to face mask. Patient having PVCs, MD aware, no new orders written.     Afternoon vitals BP low, patient denied feeling dizzy, denied any changes since bronch in the AM. MD notified. 250 NS bolus ordered and given. BP remained low. MD notified and at bedside to assess. Patient ordered and given a second 250 NS bolus. MICU notified and MD at bedside to assess. Patient put NPO and on high flow NC, to be transferred to MICU, report given.

## 2017-02-09 NOTE — Consults
PULMONARY AND CRITICAL CARE CONSULT NOTE    Patient: Ryan Shepard  MRN: [id number]  Date of Service: 02/08/2017  Attending Physician: Vertis Kelch., MD12/13/2018  Length of Stay: 7 days    Reason for Consultation   Ryan Shepard was seen by the Pulmonary and Critical Care consult service  for evaluation of nodular opacities concerning for pulmonary fungal infection.    History of Present Illness   Ryan Shepard is a 76 y.o. year old male with COPD (no PFT available, on tiotropium, budesonide-formoterol), prior tobacco use (>40 pack years, adamant that Ryan Shepard quit ~ 20-25 years ago), PAD s/p femoral graft (2009, on plavix qD), HTN, HLD, DM2, pAfib (on apixaban) GERD, depression / anxiety, chronic pain, who presented with worsened dyspnea and sputum production on 12/6, concerning for a COPD exacerbation.    Patient reportedly has had a 3L NC O2 requirement for the past 3 years, although only intermittently uses, but noticed saturations of~ 80% prior to admission.  Endorsed some recent lower extremity swelling, and orthopnea, and was recently started on diuretics.  Endorses two prior hospitalizations in the past year for presumed COPD exacerbations, including one admission to the ICU at an OSH in [place], but denies any history of intubation or NIPPV.  Admitted on 12/6 for presumed COPD exacerbation reported in the setting of ongoing tobacco use; CXR demonstrating confluent airspace opacities concerning for multifocal aspiration/pneumonia although procalcitonin was negative at that time..  Patient endorsed worsened dyspnea (exertional > rest), as well as increased productive cough with ''variable'' color sputum, including ''rust.''  Denies any frank hemoptysis, fevers, chills, night sweats, chest pain, palpitations; thinks Ryan Shepard may have been losing weight although is uncertain.    Denies any history of homelessness, incarceration or recent travel outside of the country.  Went to [place] in the summer, and [place] in the fall.    Patient was treated with levofloxacin, pred 40 x 5 days, and duonebs.  On 12/11, patient was deemed ready for discharge with a stable 2-3L NC requirement (although per patient not back to his baseline); however, respiratory cultures from admission demonstrated candida and aspergillus species.  Patient underwent CT chest demonstrating: nodular consolidation within LUL and lingula, as well as RUL and RLL read as consistent with likely fungal infection.  In addition, patient found to have  bulky intrathoracic multicompartamental lymphadenopathy.   ID consultation recommended pulmonary evaluation for bronchoscopy with likely biopsy.  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    Hospital Course/Interval Events     PO lasix 40mg  qD  Abx: Levaquin (12/6 - 12/12)  S/p prednisone 40mg  qD (12/6 - 12/10)  Duonebs 12/6 - 12/11; now on home meds.  Last dose apixaban 12/12 AM @ 9 AM.  Last dose plavix 12/12 AM    Past Medical History     Past Medical History:   Diagnosis Date   ??? Cancer (HCC/RAF)    ??? Diabetes (HCC/RAF)    ??? GERD (gastroesophageal reflux disease)    ??? Hypercholesteremia    ??? Hypertension    ??? PAD (peripheral artery disease) (HCC/RAF)    ??? 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 Prostate disease    ??? Scoliosis     adolesent   ??? Vascular disease        Past Surgical History     Past Surgical History:   Procedure Laterality Date   ???  BACK SURGERY     ??? BILATERAL FEMORAL ARTERY EXPLORATION  2009   ??? BLADDER SURGERY  1999   ??? PROSTATE SURGERY  2000        Family History     Family History   Problem Relation Age of Onset   ??? Cancer Mother    ??? Cancer Father    ??? Malignant hyperthermia Neg Hx        Social History     States that Ryan Shepard quit smoking in mid 90s; 2-3 packs per day for >20 years.  Denies marijuana use  Rare alcohol use Denies any cocaine / methamphetamine use.  Lives in [place] with wife      Medications     Scheduled:    acyclovir 400 mg Oral TID   albuterol 2.5 mg Nebulization Q4H   apixaban 5 mg Oral BID   budesonide-formoterol 2 puff Inhalation BID   busPIRone 15 mg Oral BID   clopidogrel 75 mg Oral Daily   docusate 100 mg Oral BID   DULoxetine 60 mg Oral Daily   furosemide 40 mg Oral Daily   gabapentin 800 mg Oral TID   ipratropium 0.5 mg Nebulization Q4H   metFORMIN 1,000 mg Oral BID w/meals   pantoprazole 40 mg Oral Daily   polyethylene glycol 17 g Oral Daily   pravastatin 40 mg Oral QHS   pyridoxine 100 mg Oral Daily   senna 1 tablet Oral QHS      Continuous:     PRN:  insulin aspart **AND** dextrose, diphenhydrAMINE, HYDROcodone-acetaminophen, LORazepam       Allergies   No Known Allergies     Physical Exam   Temp:  [36 ???C (96.8 ???F)-37.2 ???C (99 ???F)] 36.5 ???C (97.7 ???F)  Heart Rate:  [73-82] 75  Resp:  [15-22] 22  BP: (80-139)/(55-75) 80/59  NBP Mean:  [67-90] 67  FiO2 (%):  [40 %] 40 %  SpO2:  [88 %-96 %] 93 %     Gen: Well-developed male in no acute distress  Eyes: PERRL, EOMI  ENT: OP clear, MMM  Neck: Supple, trachea midline, no cervical or supraclavicular lymphadenopathy  CV: RRR, normal S1S2, no m/r/g  Pulm: Prolonged expiratory phase with scattered wheezes throughout.  Crackles at RLL, adventitial sounds in LUL.  Abd: NABS, soft, flat, NT, no masses or organomegaly  Skin: Warm and dry, no rashes  Ext: No cyanosis or edema. Distal pulses 2+ and symmetric  Neuro: A&Ox4, CNII-XII intact, motor 5/5 throughout, sensation grossly intact    Laboratory Data   CBC  Recent Labs      02/06/17   0531   WBC  9.77   HGB  14.0   HCT  44.1   MCV  88.2   PLT  122*     BMP  Recent Labs      02/08/17   0512  02/07/17   0545  02/06/17   0531   NA  136  137  138   K  4.6  4.2  5.2   CL  97  95*  98   CO2  31*  28  31*   BUN  29*  24*  23*   CREAT  1.16  0.92  1.01   CALCIUM  8.5*  8.5*  9.2   MG  1.3*  1.6  1.5     LFT Recent Labs      02/06/17   0531   TOTPRO  6.1  ALBUMIN  3.2*   BILITOT  0.3   BILICON  <0.2   ALT  26   AST  27   ALKPHOS  51     Coags  Recent Labs      02/08/17   0512  02/07/17   2320  02/07/17   1755   APTT  114.8*  70.7*  36.3*        HIV negative 01/2017    Microbiology   Aspergillus Ag EIA pending  Aspergillus fumigatus (M3) IgE pending  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+  Bacterial culture gram stain from vesicle - Coag neg staph like colonies, few enteroccocus like colonies  Cryptococcal Ag - neg  Cocci EIA/Ag - IP   12/6 Sputum few Candida and Aspergillus species, few normal flora  MRSA screen negative    Histoplasma urinary / serum Ag pending  MTB quantiferon pending    Imaging/Studies     02/05/2017 CT Chest w/o contrast:  IMPRESSION:  Atherosclerotic calcification of the thoracic aorta, brachiocephalic, bilateral common carotid, bilateral subclavian and bilateral axillary, left anterior descending, circumflex and right coronary arteries. Enlargement of the central pulmonary arteries, the main pulmonary artery currently 39 mm in maximal axial diameter, with associated calcifications, consistent with pulmonary arterial hypertension. Small left pleural effusion. Small pericardial effusion.  Moderate smoking-related centrilobular emphysema.  Coalescent airspace and nodular consolidation within the left upper lobe and lingula, and to a lesser extent, within the right upper and right lower lobes, consistent with pneumonia, likely fungal. Associated bulky intrathoracic multicompartmental lymphadenopathy; for example, AP window lymph node, currently 38 x 23 mm (3-40).    02/04/2017 CXR:  IMPRESSION:  Persistent airway thickening in the left lung and more confluent airspace opacity in the left perihilar region, more prominent in the upper lobe as well as airway thickening and patchy opacities in the right infrahilar/perihilar region, suspicious for multifocal pneumonia without significant interval change, allowing for change in lung volumes. Follow-up to resolution suggested to exclude underlying mass lesion in the left perihilar region.  Small left and probable trace right pleural effusion. No pneumothorax.  Cardiomediastinal silhouette unchanged.    02/01/2017 CXR:  Increasing diffuse confluent airspace opacities within the left suprahilar region and perihilar region with interstitial prominence throughout the left lung and to lesser extent right lung concerning for multifocal aspiration/pneumonia.  Aortic calcification tortuosity with cardiomegaly.  No large effusions or significant pneumothorax.  Osteopenia and multilevel spondylotic maturation changes.    02/01/2017 TTE:   1. Technically difficult study due to poor acoustic windows.   2. Small LV size, moderate concentric left ventricular hypertrophy, hyperdynamic systolic function, mild LV diastolic dysfunction (grade I, impaired relaxation).   3. Left ventricular ejection fraction is approximately 70-75%.   4. Normal right ventricular size and normal systolic function.   5. There are no prior echocardiograms available for comparison.    Assessment   Ryan Shepard is a 76 y.o. male OPD (no PFT available, on tiotropium, budesonide-formoterol), extensive smoking history (>40 pack years), PAD s/p femoral graft (2009, on plavix qD), HTN, HLD, DM2, pAfib (on apixaban) GERD, depression / anxiety, chronic pain, who presented with worsened dyspnea and sputum production on 12/6 now improved following treatment for COPD exacerbation with levaquin, duonebs and 5 day course of oral steroids.    The pulmonology team is consulted for evaluation of nodular consolidation and thoracic lymphadenopathy on CT w/o contrast in an HIV negative male, performed in the setting of recent isolation of aspergillus and candida  in sputum samples.   The patient is without known immunosuppression and per report has not been on chronic steroids outside of the hospital.  While the imaging could be consistent with pulmonary aspergillosis with reactive lymphadenopathy, the aspergillus isolated on sputum culture may also represent a benign colonizer.  Unfortunately, the patient is currently receiving apixaban and plavix, which would delay any attempts at biopsy (necessary for definitive histopathologic diagnosis of invasive aspergillosis) for > 5 days.  Nevertheless, a bronchoscopy with BAL would be of utility for identify other potential fungal or mycobacterial etiologies and surrogate markers of aspergillus infection.  The patient's radiographic findings may also represent malignancy, however, will first plan to evaluate for underlying infectious etiologies before biopsying the nodules / lymph nodes.    Recommendations   - Plan for BAL tomorrow (02/08/2017), we will defer biopsy at this time.  Please hold apixaban starting tonight and make NPO at midnight.  Patient's CHA2DS2-VASc at least 5, with annual risk of stroke estimated at 7.2%, for an estimated daily risk of ~ 0.02%, considered to be low.  Please hold any heparin products starting at 5AM on 02/08/2017.  - Please send serum beta-D glucan and galactomannan assays.  - Follow-up histoplasma urine / serum Ag, Cocci EIA, Aspergillus Ag EIA, Aspergillus fumigatus IgE, MTB quantiferon studies.  - Please have patient follow-up with Dr. Gearldine Bienenstock Bradly Shepard (scheduler [phone]) 1-2 weeks following discharge.  We will plan for a repeat CT chest without contrast within 6 weeks following discharge.  If there is evidence of ongoing lymphadenopathy or consolidation without definitive explanation, we will likely plan for repeat bronchoscopy with biopsy to evaluate for underlying malignancy.    DWA Dr. Gearldine Bienenstock Ryan Shepard.    Please page the on-call pulmonary consult fellow [phone]) with any questions or concerns.  We will continue to follow    Author:  Gearldine Bienenstock Keyton Bhat, MD, PhD  [phone]  02/08/2017 5:13 PM ATTENDING ADDENDUM NOTE:  Thank you for involving [facility] in the care of Ryan Shepard.  Patient seen, chart reviewed and case discussed with Dr. Daphane Shepard. Agree with impression and plan as noted.       Ryan Bienenstock Ryan Duchemin, MD [facility]  Interventional Pulmonology  Critical Care Medicine  Clinical Instructor of Medicine  [employer] at [facility] Interventional Pulmonology [phone]

## 2017-02-09 NOTE — Progress Notes
MICU TRANSFER ACCEPT NOTE      PMD: Wilburn Mylar, MD  DATE OF SERVICE: 02/08/2017  HOSPITAL DAY: 7    Ryan Shepard is a 76 y.o. male???with history of COPD (on baseline 3L), grade I diastolic dysfunction, atrial fibrillation (on apixaban), HTN, DM2, PAD s/p femoral graft, GERD admitted to [facility]  12/6 for dyspnea with LUL consolidation; transferred to MICU 12/13 for worsening mixed hypoxemic/hypercapneic respiratory failure.    Patient presented to [facility] ED 12/6 with one week dyspnea; initial exam notable for labored breathing, wheezing, lower extremity edema; CXR notable for LUL consolidation. Treated multifactorially for CAP (levofloxacin 12/6-12), COPD exacerbation (prednisone 12/6-10), volume overload (daily lasix, total net -5L). Admission sputum cultures subsequently returned positive for aspergillus, with CT chest demonstrating biapical L>R consolidations with bulky intrathoracic lymphadenopathy. ID and Pulmonology consulted for further workup, and bronchoscopy with BAL completed 12/13. Biopsy deferred given anticoagulation. Procedural sedation with propofol.    Patient following biopsy developed increasing oxygen requirements from 3L to 15L facemask over ~4h period. Also with relative hypotension from baseline SBP 130s to 80s despite 250cc fluids boluses. Blood gas notable for worsened hypercapnia (50 -> 70). Patient endorsed worsening dyspnea with cough productive of think yellow sputum, rarely blood-streaked. Denied fevers, chills, chest pain, palpipations, dizziness, orthopnea, swelling.  Transferred to MICU for close monitoring and respiratory support.     Medications     Scheduled:    acyclovir 400 mg Oral TID   albuterol 2.5 mg Nebulization Q4H   apixaban 5 mg Oral BID   budesonide-formoterol 2 puff Inhalation BID   busPIRone 15 mg Oral BID   clopidogrel 75 mg Oral Daily   docusate 100 mg Oral BID   DULoxetine 60 mg Oral Daily   gabapentin 800 mg Oral TID   ipratropium 0.5 mg Nebulization Q4H pantoprazole 40 mg Oral Daily   piperacillin/tazobactam 3.375 g Intravenous Q8H   polyethylene glycol 17 g Oral Daily   pravastatin 40 mg Oral QHS   pyridoxine 100 mg Oral Daily   senna 1 tablet Oral QHS   vancomycin 1.25 g Intravenous Q12H   voriconazole 6 mg/kg (Adjusted) Intravenous Q12H      Continuous:  ??? sodium chloride 50 mL/hr (02/08/17 1906)      PRN:  insulin aspart **AND** dextrose, diphenhydrAMINE, HYDROcodone-acetaminophen, LORazepam      Physical Exam   Temp:  [36 ???C (96.8 ???F)-37 ???C (98.6 ???F)] 36.7 ???C (98.1 ???F)  Heart Rate:  [67-82] 70  Resp:  [12-22] 12  BP: (80-139)/(56-81) 101/62  NBP Mean:  [67-90] 74  FiO2 (%):  [40 %] 40 %  SpO2:  [88 %-96 %] 89 %   UOP: Urine:  [700 mL-820 mL] 700 mL I/Os:     Intake/Output Summary (Last 24 hours) at 02/09/17 0655  Last data filed at 02/09/17 0600   Gross per 24 hour   Intake             1935 ml   Output             2970 ml   Net            -1035 ml     General: ???elderly [name] with mildly labored breathing on 15L facemask, speaking in full sentences, not acutely distressed  HEENT: PERRL, EOMI, OP clear  CV: RRR, normal S1S2, no m/r/g, JVP nondistended  Pulm: diffusely diminished in left-sided lung fields, no crackles/wheeze  Abd: NABS, soft, flat, NT, no masses or organomegaly  Skin: Warm and dry, no rashes, no LEE  Neuro: A&Ox3, no focal deficits    Laboratory Data   CBC:  Recent Labs      02/08/17   1855  02/06/17   0531   WBC  8.58  9.77   NEUTABS  6.25  7.08*   HGB  13.3*  14.0   HCT  40.8  44.1   MCV  90.1  88.2   PLT  118*  122*     BMP:  Recent Labs      02/08/17   1855  02/08/17   0512  02/07/17   0545   NA  137  136  137   K  4.8  4.6  4.2   CL  96  97  95*   CO2  31*  31*  28   BUN  27*  29*  24*   CREAT  1.15  1.16  0.92   GLUCOSE  111*  115*  151*   CALCIUM  8.9  8.5*  8.5*   MG  2.1*  1.3*  1.6   PHOS  3.4   --    --      LFT:  Recent Labs      02/08/17   1855  02/06/17   0531   TOTPRO  5.8*  6.1   ALBUMIN  2.8*  3.2*   BILITOT  0.4  0.3 BILICON  <0.2  <0.2   ALT  18  26   AST  20  27   ALKPHOS  50  51     COAGS:  Recent Labs      02/08/17   0512  02/07/17   2320  02/07/17   1755   APTT  114.8*  70.7*  36.3*       Microbiology   BAL gram stain +rare GPC, yeast with pseudohyphae  Aspergillus Fumigatus 02/05/17: <0.10  Cryptococcal Ag blood 02/06/17: negative  Bacterial Culture Respiratory 02/01/17: Few Probable candida albicans , Aspergillus [place]???   MRSA nares 02/01/17: No Methicillin-resistant Staphylococcus aureus isolated.   Sputum screen 02/01/17: <10 Epithelial cells per low power field   Resp bacterial Cx 02/01/17: <10 Epithelial cells per low power field   Legionella urine 02/01/17: negative     Imaging   CXR 12/13  There are large masslike areas of confluent consolidation in the left superhilar region and extensive mediastinal adenopathy. There are less well-defined areas of airway thickening consolidation throughout the remainder of the left lung and at the right   lung base. There is a small left pleural effusion.    Chest CT 12/10  Atherosclerotic calcification of the thoracic aorta, brachiocephalic, bilateral common carotid, bilateral subclavian and bilateral axillary, left anterior descending, circumflex and right coronary arteries. Enlargement of the central pulmonary arteries,   the main pulmonary artery currently 39 mm in maximal axial diameter, with associated calcifications, consistent with pulmonary arterial hypertension.   Small left pleural effusion. Small pericardial effusion.  Moderate smoking-related centrilobular emphysema.  Coalescent airspace and nodular consolidation within the left upper lobe and lingula, and to a lesser extent, within the right upper and right lower lobes, consistent with pneumonia, likely fungal. Associated bulky intrathoracic multicompartmental   lymphadenopathy; for example, AP window lymph node, currently 38 x 23 mm (3-40).  ???  Assessment/Plan Ryan Shepard is a 76 y.o. male???with history of COPD (on baseline 3L), grade I diastolic dysfunction, atrial fibrillation (on apixaban), HTN, DM2, PAD s/p femoral graft, GERD  admitted to [facility]  12/6 for dyspnea with LUL consolidation; transferred to MICU 12/13 for worsening mixed hypoxemic/hypercapneic respiratory failure.    #Mixed hypoxemic/hypercapneic respiratory failure. Acute hypoxemia with increasing O2 requirement and hypercapnia following bronchoscopy. Highest concern for post-procedural inflammation/bronchoconstriction, however differential also includes pulmonary aspergillosis given prominent LUL opacity with associated lymphadenopathy and  positive fungal/aspergillus cultures. Also considered acute bacterial pneumonia, though lower suspicion in absence of SIRS, negative lactate/procal.  - HFNC 40L/40% for goal FiO2>88%  - q4h VBG, transition to bipap if worsening hypercapnia  - albuterol/ipratropium nebs q4h  - empiric vanc/zosyn (12/14-) with plan for d/c if BAL cultures remain negative x 48h  - empiric voriconazole (12/14-) with plan for ID consultation regarding continued course pending BAL cultures  ??????  #HFpEF. Grade I diastolic dysfunction per 01/2017 TTE, EF 70%. Euvolemic on transfer to MICU.   - s/p 500cc LR; repeat as tolerated for goal SBP>90    #Afib. Rate-controlled, anticoagulated.   - hold carvedilol 25 mg daily ???BID   - apixaban 5 mg BID  ???    #HTN. Relatively hypotensive on transfer, though without signs of end-organ hypoperfusion, MAP maintained >70.   - hold losartan 100 mg daily     #DM2. BG at goal.   - insulin sliding scale algorithm #3  - continue home gabapentin 800 mg ???TID     #PAD. S/p femoral-femoral graft 2009.  - clopidogrel 75 mg daily     #HLD chronic/stable  - pravastatin 40 mg nightly     #genital HSV. With active flare, newly diagnosed.   - acyclovir 400mg  po TID (12/10-12/17)  ???  #GERD chronic/stable  - pantoprazole 40 mg daily   ???  #depression/ anxiety - buspirone 15 mg BID  - duloxetine DR 60   - lorazepam 2 mg PRN BID  ???  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   - Norco 10-325 mg q6h PRN   ???  Inpatient Checklist:  FEN/GI:???  Diet: NPO  Analgesia:???Norco  Sedation:???N/A  DVT Prophylaxis: on home apixaban and clopidogrel   GI Prophylaxis: on PPI  Glycemic control:???insulin aspart algorithm #2  Bowel regimen:???miralax, senna  ???  Code Status: Full Code  Contact: ???Primary Emergency Contact: Collen, Vincent (wife), Home Phone: [phone]  ???  To be discussed with attending Dr Kirke Shaggy  ???  Author:  Celestia Khat, MD  [phone]  02/08/2017

## 2017-02-09 NOTE — Consults
Noted last night transferred to MICU team. Sent e-mail to [facility] MICU CM for hand off.     Pt has [place] with [place]- sent msg via [place] re: transfer to MICU.    Leslee Home CM, RN BSN  Medicine Team D  P: [phone]

## 2017-02-09 NOTE — Progress Notes
Pharmaceutical Services ??? Vancomycin Dosing (Initial)    Patient Name: Ryan Shepard  MRN: [id number]  Age: 76 y.o.  Sex: male    Vancomycin per Pharmacy Consult Order     Start     Ordered    02/08/17 1853  vancomycin per pharmacy  Per Protocol     Question Answer Comment   Indication Pneumonia    Goal Trough Serum Level 15-20 mcg/mL    Has patient received a dose of this drug within the past 72 hours? No        02/08/17 1857        Vital Signs/Other Objective Data  Allergies: Patient has no known allergies.    BP 91/58  ~ Pulse 74  ~ Temp 36.4 ???C (97.5 ???F) (Oral)  ~ Resp 18  ~ Ht 1.854 m (6' 0.99'')  ~ Wt (!) 102.5 kg (226 lb)  ~ SpO2 91%  ~ BMI 29.82 kg/m???     Intake/Output Summary (Last 24 hours) at 02/08/17 1912  Last data filed at 02/08/17 1500   Gross per 24 hour   Intake             1166 ml   Output             1520 ml   Net             -354 ml       Medications  Med Administrations and Associated Flowsheet Values (last 72 hours)  Vancomycin administration    None        Labs (most recent)  White Blood Cell Count   Date Value Ref Range Status   02/08/2017 8.58 4.16 - 9.95 x10E3/uL Final   01/25/2008 7.82 3.28 - 9.29 x10E3/uL      Urea Nitrogen   Date Value Ref Range Status   02/08/2017 29 (H) 7 - 22 mg/dL Final   69/62/9528 33 (H) 7 - 23 mg/dL Final     Creatinine   Date Value Ref Range Status   02/08/2017 1.16 0.60 - 1.30 mg/dL Final   41/32/4401 0.9 0.5 - 1.3 mg/dL      No results found for: Paulo Fruit  Assessment  Indication Pneumonia  Goal trough level 15-20 mcg/mL  Estimated Creatinine Clearance 46ml/min  This patient is receiving dialysis: No    Plan  Ryan Shepard is a 76 y.o. male who has been referred to pharmacy for vancomycin dosing. The indication for vancomycin is Pneumonia. Start vancomycin 1250 mg IV q12h. Pharmacy will continue to monitor the patient's clinical progress. The next vancomycin trough is scheduled for 02/10/17 at 0800.  Low threshold to adjust or move up trough if SCr continues to increase.    Evelia Waskey [facility] [name], PharmD, 02/08/2017, 7:12 PM

## 2017-02-09 NOTE — Progress Notes
Internal Medicine Transfer Summary    Patient ID: Ryan, Shepard [id number], 09/06/1940  Admit date: 02/01/2017    Discharge date: 02/09/2017  Transferring Team: [facility] W[facility] Team D  Transferring Attending: Marykay Lex, MD  Transferring Resident: Paulla Dolly, MD  Transferring Intern: Bascom Levels, MD  Accepting Team: MICU   Accepting Resident: Celestia Khat, MD    Reason for Transfer: hypoxia, hypotension, worsening hypercapnia    HPI:   Please see H&P for full HPI. In brief, Ryan Shepard is a 76 y.o. male  with history of COPD (on baseline 3L), grade I diastolic dysfunction, atrial fibrillation (on apixaban), HTN, DM2, PAD s/p femoral graft, GERD admitted 12/6 for dyspnea with LUL consolidation; transferred to MICU 12/13 for worsening mixed hypoxemic/hypercapneic respiratory failure.    Medications:   ??? acyclovir  400 mg Oral TID   ??? albuterol  2.5 mg Nebulization Q4H   ??? apixaban  5 mg Oral BID   ??? budesonide-formoterol  2 puff Inhalation BID   ??? busPIRone  15 mg Oral BID   ??? clopidogrel  75 mg Oral Daily   ??? docusate  100 mg Oral BID   ??? DULoxetine  60 mg Oral Daily   ??? gabapentin  800 mg Oral TID   ??? ipratropium  0.5 mg Nebulization Q4H   ??? pantoprazole  40 mg Oral Daily   ??? piperacillin/tazobactam  3.375 g Intravenous Q8H   ??? polyethylene glycol  17 g Oral Daily   ??? pravastatin  40 mg Oral QHS   ??? pyridoxine  100 mg Oral Daily   ??? senna  1 tablet Oral QHS   ??? vancomycin  1.25 g Intravenous Q12H     ??? sodium chloride 50 mL/hr (02/08/17 1906)     insulin aspart **AND** dextrose, diphenhydrAMINE, HYDROcodone-acetaminophen, LORazepam    Physical Exam:   BP 107/62  ~ Pulse 71  ~ Temp 37 ???C (98.6 ???F) (Axillary)  ~ Resp 20  ~ Ht 1.854 m (6' 0.99'')  ~ Wt (!) 104.5 kg (230 lb 6.1 oz)  ~ SpO2 93%  ~ BMI 30.40 kg/m???   - deferred, patient transferred overnight     Hospital Course:    Ryan Shepard???is a 76 y.o.???male???with COPD, hx of tobacco use disorder, PAD s/p femoral graft, HTN, DM2, HLD, A.fib, GERD, depression, anxiety, chronic pain, who initially presented with intermittent SOB and LE edema???x1 week. Patient was treated for COPD exacerbation and PNA s/p levofloxacin. Resp cx revealed aspergillus [place] and pulm and ID was consulted for further management. Pt underwent bronchoscopy on 02/08/17. Upon returning to the medicine floor post bronch, patient desatted as high as low 60s and had increased rhonchi,  rapid response was called. RT at bedside =gave stat albuterol & atrovent nebs.  Pt was placed on 15L NRB and persistently hypoxic. Stat VBG obtained and notable for worsening respiratory acidosis. Pt received chest PT, RTC nebs, deep suctioning which were all ordered. Around 5pm the patient was on facemask at 15L again (although likely getting closer to 10L since not on NRB). His rhonchi had improved but it was concerning that his O2 saturation had not significantly improved with multiple therapies. Also, patient was found to also be hypotensive most likely 2/2 BP agents given post bronch, dehydration from NPO status, and propofol during bronch. Patient was asymptomatic, A&Ox3. Patient did not respond to 250 cc boluses.    #acute hypoxic respiratory failure (worsening)   S/p bronchoscopy with BAL 02/08/17  - Scheduled  duonebs q4h  - on facemask FiO2 40%, 15L  - aerobika, chest PT, deep suctioning  - Trend VBG  ???  #hypotension  SBP 80s, asymptomatic, likely iatrogenic given recent sedation during bronchoscopy and patient was given BP meds upon arrival to floor shortly after procedure, NPO for prolonged time period  - s/p 250 cc NS bolus x 2  - NS @50cc /hr  - holding losartan and carvedilol   ???  #Pneumonia  positive aspirgillus on resp cx and characteristics of consilidation on CT raises concern for fungal pneumonia.   - ID following  - Pulm consulted, appreciate rec's:   - s/p bronchoscopy with BAL this AM, follow analysis - fu beta-D glucan and galctomannan assays???  - fu fungal studies (histoplasma quant gold)  - pt will neef follow up with Dr. Larae Grooms He 1-2 wks post discharge with plans to repeat CT w/o contrast within 6 weeks post discharge and if evidence of ongoing LAD or consolidation pulm will plan for possible repeat bronchoscopy with biopsy to evaluate underlying malignancy   ???  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 Tender vesicles seen on penile meatus on exam, patient reports ongoing x 1 day; on re-disussion with patient, denies previous episodes.  - acyclovir 400mg  po TID x 7???days (first episode) (12/10-12/17)  - follow C/G urine PCR  - obtain HIV screen (verbal consent obtained from patient)   ???  # acute on chronic respiratory failure   #grade 1 diastolic dysfunction   DDx:???COPD exacerbation, community vs hospital acquired pneumonia, less likely pulmonary congestion 2/2 CHF. Patient has an extensive tobacco use history with dx COPD and was recently admitted to OSH in [place] for PNA (undertreated augmentin not great for atypicals)  CXR 02/01/17 revealed increasing diffuse confluent airspace opacities within the left suprahilar region and perihilar region with interstitial prominence throughout the left lung and to lesser extent right lung concerning for multifocal aspiration/pneumonia. Aortic calcification tortuosity with cardiomegaly.  procalcitonin <0.1. Weight downtrending.   PE: crackles bilaterally, 3+ LE edema, Bedside U/S revealed IVC of 2.7 cm, non-collapsable and RIJ ~ 6-7 cm, non-collapsable   - TTE 02/01/17 revealed grade 1 diastolic dysfunction, LVEF 70-75%  - strict ins/outs   - daily weights (103.4 kg on admission)  - HOLD PO Lasix 40 mg daily   - s/p PO levofloxacin 750 mg daily, plan for 7-day course last day 12/12  - keep SpO2 >???88 (on NC --> okay to use face mask)  - symbicort 2 puffs BID with teaching   - s/p Prednisone 40 mg PO daily x 5 days (12/6-12/10) last day today  ???  #hx of Afib - continuous cardiac monitoring   - HOLD home carvedilol 25 mg daily ???BID   - restart  apixaban 5 mg BID    ???  #PAD: chronic/stable  PAD s/p femoral-femoral graft (2009)  - continue clopidogrel 75 mg daily   ???  #HTN chronic/improved, now hypotensive as above  SBP 130s-170s, on home losartan-HCTZ (non-formulary), Cr stable  - HOLD losartan 100 mg daily  & coreg 25mg  BID  ???  #HLD chronic/stable  - continue home pravastatin 40 mg nightly   ???  #GERD chronic/stable  - continue pantoprazole 40 mg daily   ???  #DM2 chronic/stable  - hold home metformin   - accuchecks (GLC 125-240), patient on prednisone  - insulin sliding scale algorithm #3  - continue home gabapentin 800 mg ???TID   ???  #depression   #anxiety  - continue  buspirone 15 mg BID  - continue home duloxetine DR 60   - continue home lorazepam 2 mg PRN BID  ???  #chornic pain  Per patient ongoing lower back and lower extremity pain for several years  - continue home Norco 10-325 mg q6h PRN   - schedule with outpatient [facility] management at [facility]   ???  Inpatient Checklist:  FEN/GI:???  Diet: NPO  Analgesia:???Norco  Sedation:???N/A  DVT Prophylaxis: on home apixaban and clopidogrel   GI Prophylaxis: on PPI  Glycemic control:???insulin aspart algorithm #2  Bowel regimen:???NA  ???  Code Status: Full Code  Contact: ???Primary Emergency Contact: Klay, Sobotka (wife), Home Phone: [phone]  ???  DWA Vertis Kelch., MD  DWA Dr Marlyn Corporal Ziyeh  02/09/2017 1:11 PM  [id number]    I saw and evaluated Ryan Shepard on 02/08/17 prior to his ICU transfer. Please see attending addendum for that date.  I discussed the case with the resident and agree with the findings and plan of care as documented in the resident's note.       Signed: Oberon Hehir C. Foster Simpson, MD

## 2017-02-10 ENCOUNTER — Ambulatory Visit: Payer: PRIVATE HEALTH INSURANCE

## 2017-02-10 LAB — Fungal Culture Respiratory

## 2017-02-10 LAB — Nocardia Culture: NOCARDIA CULTURE: NEGATIVE

## 2017-02-10 LAB — Blood Gases,venous
BASE EXCESS: 5 mmol/L
PCO2,VENOUS: 69 mmHg
PH,VENOUS: 7.25 mmHg
PH,VENOUS: 7.32 mmol/L
PH,VENOUS: 7.31 mmHg
VBG INSPIRED O2: 40 mmol/L

## 2017-02-10 LAB — Glucose,POC
GLUCOSE,POC: 152 mg/dL — ABNORMAL HIGH (ref 65–99)
GLUCOSE,POC: 97 mg/dL (ref 65–99)
GLUCOSE,POC: 116 mg/dL — ABNORMAL HIGH (ref 65–99)
GLUCOSE,POC: 169 mg/dL — ABNORMAL HIGH (ref 65–99)

## 2017-02-10 LAB — Legionella Culture
LEGIONELLA CULTURE: NEGATIVE
LEGIONELLA CULTURE: NEGATIVE

## 2017-02-10 LAB — (1,3)-Beta-D-Glucan (Fungitell): (1,3)-BETA-D-GLUCAN: 140 pg/mL

## 2017-02-10 LAB — Bacterial Culture Respiratory

## 2017-02-10 LAB — Magnesium: MAGNESIUM: 1.7 meq/L (ref 1.4–1.9)

## 2017-02-10 LAB — Basic Metabolic Panel
CHLORIDE: 98 mmol/L (ref 96–106)
POTASSIUM: 4.9 mmol/L (ref 3.6–5.3)

## 2017-02-10 LAB — MRSA Surveillance

## 2017-02-10 MED ADMIN — BUDESONIDE-FORMOTEROL FUMARATE 160-4.5 MCG/ACT IN AERO: 2 | RESPIRATORY_TRACT | @ 18:00:00 | Stop: 2017-02-22 | NDC 00186037028

## 2017-02-10 MED ADMIN — ACYCLOVIR 400 MG PO TABS: 400 mg | ORAL | @ 22:00:00 | Stop: 2017-02-11 | NDC 00904579061

## 2017-02-10 MED ADMIN — ALBUTEROL SULFATE (2.5 MG/3ML) 0.083% IN NEBU: 2.5 mg | RESPIRATORY_TRACT | @ 20:00:00 | Stop: 2017-02-14 | NDC 00487950101

## 2017-02-10 MED ADMIN — DIPHENHYDRAMINE-ZINC ACETATE 1-0.1 % EX CREA: TOPICAL | @ 06:00:00 | Stop: 2017-02-22

## 2017-02-10 MED ADMIN — IPRATROPIUM BROMIDE 0.02 % IN SOLN: 500 ug | RESPIRATORY_TRACT | @ 08:00:00 | Stop: 2017-02-19 | NDC 00487980101

## 2017-02-10 MED ADMIN — ACYCLOVIR 400 MG PO TABS: 400 mg | ORAL | @ 14:00:00 | Stop: 2017-02-10 | NDC 00904579061

## 2017-02-10 MED ADMIN — DOCUSATE SODIUM 100 MG PO CAPS: 100 mg | ORAL | @ 05:00:00 | Stop: 2017-02-22 | NDC 00904645561

## 2017-02-10 MED ADMIN — PRAVASTATIN SODIUM 40 MG PO TABS: 40 mg | ORAL | @ 05:00:00 | Stop: 2017-02-22 | NDC 51079078220

## 2017-02-10 MED ADMIN — DOCUSATE SODIUM 100 MG PO CAPS: 100 mg | ORAL | @ 18:00:00 | Stop: 2017-02-22 | NDC 00904645561

## 2017-02-10 MED ADMIN — FUROSEMIDE 10 MG/ML IJ SOLN: 20 mg | INTRAVENOUS | @ 01:00:00 | Stop: 2017-02-10 | NDC 36000028225

## 2017-02-10 MED ADMIN — IPRATROPIUM BROMIDE 0.02 % IN SOLN: 500 ug | RESPIRATORY_TRACT | @ 05:00:00 | Stop: 2017-02-19 | NDC 00487980101

## 2017-02-10 MED ADMIN — SENNOSIDES 8.6 MG PO TABS: 1 | ORAL | @ 05:00:00 | Stop: 2017-02-21 | NDC 00904652261

## 2017-02-10 MED ADMIN — PIPERACILLIN-TAZOBACTAM 3.375 GM/50 ML RTU (EXT 4HR): 3.375 g | INTRAVENOUS | @ 02:00:00 | Stop: 2017-02-11 | NDC 00206886102

## 2017-02-10 MED ADMIN — ALBUTEROL SULFATE (2.5 MG/3ML) 0.083% IN NEBU: 2.5 mg | RESPIRATORY_TRACT | @ 12:00:00 | Stop: 2017-02-14 | NDC 00487950101

## 2017-02-10 MED ADMIN — ALBUTEROL SULFATE (2.5 MG/3ML) 0.083% IN NEBU: 2.5 mg | RESPIRATORY_TRACT | @ 01:00:00 | Stop: 2017-02-14 | NDC 00487950101

## 2017-02-10 MED ADMIN — HYDROCODONE-ACETAMINOPHEN 5-325 MG PO TABS: 2 | ORAL | @ 22:00:00 | Stop: 2017-02-22 | NDC 00406012362

## 2017-02-10 MED ADMIN — GABAPENTIN 400 MG PO CAPS: 800 mg | ORAL | @ 05:00:00 | Stop: 2017-02-22 | NDC 68084077401

## 2017-02-10 MED ADMIN — ALBUTEROL SULFATE (2.5 MG/3ML) 0.083% IN NEBU: 2.5 mg | RESPIRATORY_TRACT | @ 08:00:00 | Stop: 2017-02-14 | NDC 00487950101

## 2017-02-10 MED ADMIN — HYDROCODONE-ACETAMINOPHEN 5-325 MG PO TABS: 2 | ORAL | @ 05:00:00 | Stop: 2017-02-22 | NDC 00406012362

## 2017-02-10 MED ADMIN — FUROSEMIDE 10 MG/ML IJ SOLN: 40 mg | INTRAVENOUS | @ 18:00:00 | Stop: 2017-02-10 | NDC 36000028325

## 2017-02-10 MED ADMIN — HYDROCODONE-ACETAMINOPHEN 5-325 MG PO TABS: 2 | ORAL | @ 16:00:00 | Stop: 2017-02-22 | NDC 00406012362

## 2017-02-10 MED ADMIN — CLOPIDOGREL BISULFATE 75 MG PO TABS: 75 mg | ORAL | @ 18:00:00 | Stop: 2017-02-13 | NDC 00904629461

## 2017-02-10 MED ADMIN — INSULIN ASPART 100 UNIT/ML SC SOPN: 3 mL | SUBCUTANEOUS | @ 20:00:00 | Stop: 2017-02-22 | NDC 00169633910

## 2017-02-10 MED ADMIN — BUSPIRONE HCL 5 MG PO TABS: 15 mg | ORAL | @ 18:00:00 | Stop: 2017-02-21 | NDC 16729020001

## 2017-02-10 MED ADMIN — APIXABAN 5 MG PO TABS: 5 mg | ORAL | @ 05:00:00 | Stop: 2017-02-13 | NDC 00003089431

## 2017-02-10 MED ADMIN — IPRATROPIUM BROMIDE 0.02 % IN SOLN: 500 ug | RESPIRATORY_TRACT | @ 20:00:00 | Stop: 2017-02-19 | NDC 00487980101

## 2017-02-10 MED ADMIN — IPRATROPIUM BROMIDE 0.02 % IN SOLN: 500 ug | RESPIRATORY_TRACT | @ 18:00:00 | Stop: 2017-02-19 | NDC 00487980101

## 2017-02-10 MED ADMIN — MAGNESIUM SULFATE 2 GM/50ML IV SOLN: 2 g | INTRAVENOUS | @ 20:00:00 | Stop: 2017-02-10 | NDC 44567042024

## 2017-02-10 MED ADMIN — ALBUTEROL SULFATE (2.5 MG/3ML) 0.083% IN NEBU: 2.5 mg | RESPIRATORY_TRACT | @ 05:00:00 | Stop: 2017-02-14 | NDC 00487950101

## 2017-02-10 MED ADMIN — PANTOPRAZOLE SODIUM 40 MG PO TBEC: 40 mg | ORAL | @ 16:00:00 | Stop: 2017-02-22 | NDC 68084081309

## 2017-02-10 MED ADMIN — PYRIDOXINE HCL 50 MG PO TABS: 100 mg | ORAL | @ 18:00:00 | Stop: 2017-02-22 | NDC 00536440801

## 2017-02-10 MED ADMIN — ACYCLOVIR 400 MG PO TABS: 400 mg | ORAL | @ 05:00:00 | Stop: 2017-02-10 | NDC 00904579061

## 2017-02-10 MED ADMIN — PIPERACILLIN-TAZOBACTAM 3.375 GM/50 ML RTU (EXT 4HR): 3.375 g | INTRAVENOUS | @ 09:00:00 | Stop: 2017-02-11 | NDC 00206886102

## 2017-02-10 MED ADMIN — IPRATROPIUM BROMIDE 0.02 % IN SOLN: 500 ug | RESPIRATORY_TRACT | @ 12:00:00 | Stop: 2017-02-19 | NDC 00487980101

## 2017-02-10 MED ADMIN — GABAPENTIN 400 MG PO CAPS: 800 mg | ORAL | @ 14:00:00 | Stop: 2017-02-22 | NDC 68084077401

## 2017-02-10 MED ADMIN — POLYETHYLENE GLYCOL 3350 17 G PO PACK: 17 g | ORAL | @ 19:00:00 | Stop: 2017-02-22

## 2017-02-10 MED ADMIN — BUDESONIDE-FORMOTEROL FUMARATE 160-4.5 MCG/ACT IN AERO: 2 | RESPIRATORY_TRACT | @ 05:00:00 | Stop: 2017-02-22 | NDC 00186037028

## 2017-02-10 MED ADMIN — DULOXETINE HCL 60 MG PO CPEP: 60 mg | ORAL | @ 18:00:00 | Stop: 2017-02-22 | NDC 68084067521

## 2017-02-10 MED ADMIN — IPRATROPIUM BROMIDE 0.02 % IN SOLN: 500 ug | RESPIRATORY_TRACT | @ 01:00:00 | Stop: 2017-02-19 | NDC 00487980101

## 2017-02-10 MED ADMIN — GABAPENTIN 400 MG PO CAPS: 800 mg | ORAL | @ 22:00:00 | Stop: 2017-02-22 | NDC 68084077401

## 2017-02-10 MED ADMIN — APIXABAN 5 MG PO TABS: 5 mg | ORAL | @ 18:00:00 | Stop: 2017-02-13 | NDC 00003089431

## 2017-02-10 MED ADMIN — PIPERACILLIN-TAZOBACTAM 3.375 GM/50 ML RTU (EXT 4HR): 3.375 g | INTRAVENOUS | @ 18:00:00 | Stop: 2017-02-11 | NDC 00206886102

## 2017-02-10 MED ADMIN — ALBUTEROL SULFATE (2.5 MG/3ML) 0.083% IN NEBU: 2.5 mg | RESPIRATORY_TRACT | @ 18:00:00 | Stop: 2017-02-14 | NDC 00487950101

## 2017-02-10 MED ADMIN — BUSPIRONE HCL 5 MG PO TABS: 15 mg | ORAL | @ 05:00:00 | Stop: 2017-02-21 | NDC 16729020001

## 2017-02-10 MED ADMIN — POLYETHYLENE GLYCOL 3350 17 G PO PACK: 17 g | ORAL | @ 18:00:00 | Stop: 2017-02-22 | NDC 68084043098

## 2017-02-10 NOTE — Other
Patients Clinical Goal:   Clinical Goal(s) for the Shift: 1. Monitor resp distress spo2 >88%. 2. Monitor hemodynamics. 3. encourage diet. 4.encourage ambulation   Identify possible barriers to advancing the care plan: none  Stability of the patient: Moderately Unstable - medium risk of patient condition declining or worsening    End of Shift Summary:     High Flow throughout the day 45%/50L. Patient appears comfortable RR 20's denies shortness of breathe.  Afternoon VBG worsening CO2 increased to 82 pH 7.25, A&O x3. Placed on Bipap at 1800 40%.   Up to Chair for 3 hours today.   Standing weight collected 103.6 kgs.   Advanced diet to carb controlled ate 100% of lunch.   20mg  IV lasix given. Current I&o -140ml/12hours.

## 2017-02-10 NOTE — Other
Patients Clinical Goal:   Clinical Goal(s) for the Shift: Monitor hemodynamic stability. Keep safe and comfortable.  Identify possible barriers to advancing the care plan:   Stability of the patient: Moderately Unstable - medium risk of patient condition declining or worsening    End of Shift Summary: Alert and oriented x 4, afebrile, normotensive, patient was on BIPAP for 3.0 hrs last night and maintained on HFNC 50% with 40 LPM oxygen to keep O2 sat >88% with latest VBG ph 7.32 pCO2 66 HCO3 32.5 Patient given norco two tabs once overnight for back pain which offered relief.  Intake and output -482 cc. CHG bath done.

## 2017-02-11 DIAGNOSIS — M25562 Pain in left knee: Secondary | ICD-10-CM

## 2017-02-11 DIAGNOSIS — A6000 Herpesviral infection of urogenital system, unspecified: Secondary | ICD-10-CM

## 2017-02-11 DIAGNOSIS — J9601 Acute respiratory failure with hypoxia: Secondary | ICD-10-CM

## 2017-02-11 DIAGNOSIS — J9602 Acute respiratory failure with hypercapnia: Secondary | ICD-10-CM

## 2017-02-11 LAB — Blood Gases,venous
BICARBONATE: 33.4 mmol/L
PCO2,VENOUS: 69 mmHg
VBG INSPIRED O2: 40

## 2017-02-11 LAB — Glucose,POC
GLUCOSE,POC: 187 mg/dL — ABNORMAL HIGH (ref 65–99)
GLUCOSE,POC: 121 mg/dL — ABNORMAL HIGH (ref 65–99)
GLUCOSE,POC: 134 mg/dL — ABNORMAL HIGH (ref 65–99)

## 2017-02-11 LAB — Basic Metabolic Panel
GFR ESTIMATE FOR AFRICAN AMERICAN: 89 mg/dL (ref 65–99)
TOTAL CO2: 31 mmol/L — ABNORMAL HIGH (ref 20–30)

## 2017-02-11 LAB — Magnesium: MAGNESIUM: 1.5 meq/L (ref 1.4–1.9)

## 2017-02-11 MED ADMIN — DOCUSATE SODIUM 100 MG PO CAPS: 100 mg | ORAL | @ 04:00:00 | Stop: 2017-02-22 | NDC 00904645561

## 2017-02-11 MED ADMIN — BUSPIRONE HCL 5 MG PO TABS: 15 mg | ORAL | @ 04:00:00 | Stop: 2017-02-21 | NDC 16729020001

## 2017-02-11 MED ADMIN — INSULIN ASPART 100 UNIT/ML SC SOPN: 3 mL | SUBCUTANEOUS | @ 02:00:00 | Stop: 2017-02-22 | NDC 00169633910

## 2017-02-11 MED ADMIN — PIPERACILLIN-TAZOBACTAM 3.375 GM/50 ML RTU (EXT 4HR): 3.375 g | INTRAVENOUS | @ 02:00:00 | Stop: 2017-02-11 | NDC 00206886102

## 2017-02-11 MED ADMIN — IPRATROPIUM BROMIDE 0.02 % IN SOLN: 500 ug | RESPIRATORY_TRACT | @ 09:00:00 | Stop: 2017-02-19 | NDC 00487980101

## 2017-02-11 MED ADMIN — VORICONAZOLE 200 MG PO TABS: 200 mg | ORAL | @ 15:00:00 | Stop: 2017-02-11 | NDC 00904647104

## 2017-02-11 MED ADMIN — ALBUTEROL SULFATE (2.5 MG/3ML) 0.083% IN NEBU: 2.5 mg | RESPIRATORY_TRACT | @ 09:00:00 | Stop: 2017-02-14 | NDC 00487950101

## 2017-02-11 MED ADMIN — APIXABAN 5 MG PO TABS: 5 mg | ORAL | @ 17:00:00 | Stop: 2017-02-13 | NDC 00003089431

## 2017-02-11 MED ADMIN — BUSPIRONE HCL 5 MG PO TABS: 15 mg | ORAL | @ 17:00:00 | Stop: 2017-02-21 | NDC 16729020001

## 2017-02-11 MED ADMIN — IPRATROPIUM BROMIDE 0.02 % IN SOLN: 500 ug | RESPIRATORY_TRACT | @ 04:00:00 | Stop: 2017-02-19 | NDC 00487980101

## 2017-02-11 MED ADMIN — CARVEDILOL 12.5 MG PO TABS: 12.5 mg | ORAL | @ 22:00:00 | Stop: 2017-02-15 | NDC 68084086501

## 2017-02-11 MED ADMIN — ACYCLOVIR 400 MG PO TABS: 400 mg | ORAL | @ 22:00:00 | Stop: 2017-02-12 | NDC 00904579061

## 2017-02-11 MED ADMIN — POLYETHYLENE GLYCOL 3350 17 G PO PACK: 17 g | ORAL | @ 17:00:00 | Stop: 2017-02-22 | NDC 68084043098

## 2017-02-11 MED ADMIN — PANTOPRAZOLE SODIUM 40 MG PO TBEC: 40 mg | ORAL | @ 17:00:00 | Stop: 2017-02-22 | NDC 68084081309

## 2017-02-11 MED ADMIN — IPRATROPIUM BROMIDE 0.02 % IN SOLN: 500 ug | RESPIRATORY_TRACT | @ 04:00:00 | Stop: 2017-02-19

## 2017-02-11 MED ADMIN — ALBUTEROL SULFATE (2.5 MG/3ML) 0.083% IN NEBU: 2.5 mg | RESPIRATORY_TRACT | @ 17:00:00 | Stop: 2017-02-14 | NDC 00487950101

## 2017-02-11 MED ADMIN — CLOPIDOGREL BISULFATE 75 MG PO TABS: 75 mg | ORAL | @ 17:00:00 | Stop: 2017-02-13 | NDC 00904629461

## 2017-02-11 MED ADMIN — APIXABAN 5 MG PO TABS: 5 mg | ORAL | @ 04:00:00 | Stop: 2017-02-13 | NDC 00003089431

## 2017-02-11 MED ADMIN — POLYETHYLENE GLYCOL 3350 17 G PO PACK: 17 g | ORAL | @ 17:00:00 | Stop: 2017-02-22

## 2017-02-11 MED ADMIN — DULOXETINE HCL 60 MG PO CPEP: 60 mg | ORAL | @ 17:00:00 | Stop: 2017-02-22 | NDC 00904645461

## 2017-02-11 MED ADMIN — IPRATROPIUM BROMIDE 0.02 % IN SOLN: 500 ug | RESPIRATORY_TRACT | @ 02:00:00 | Stop: 2017-02-19 | NDC 00487980101

## 2017-02-11 MED ADMIN — METRONIDAZOLE 250 MG PO TABS: 500 mg | ORAL | @ 22:00:00 | Stop: 2017-02-13 | NDC 00904145361

## 2017-02-11 MED ADMIN — FUROSEMIDE 10 MG/ML IJ SOLN: 40 mg | INTRAVENOUS | @ 08:00:00 | Stop: 2017-02-11 | NDC 36000028325

## 2017-02-11 MED ADMIN — SENNOSIDES 8.6 MG PO TABS: 1 | ORAL | @ 04:00:00 | Stop: 2017-02-21 | NDC 00904652261

## 2017-02-11 MED ADMIN — IPRATROPIUM BROMIDE 0.02 % IN SOLN: 500 ug | RESPIRATORY_TRACT | @ 17:00:00 | Stop: 2017-02-19 | NDC 00487980101

## 2017-02-11 MED ADMIN — ALBUTEROL SULFATE (2.5 MG/3ML) 0.083% IN NEBU: 2.5 mg | RESPIRATORY_TRACT | @ 02:00:00 | Stop: 2017-02-14 | NDC 00487950101

## 2017-02-11 MED ADMIN — CEFTRIAXONE 1 GM/50 ML RTU: 1 g | INTRAVENOUS | @ 04:00:00 | Stop: 2017-02-13 | NDC 00338500241

## 2017-02-11 MED ADMIN — PYRIDOXINE HCL 50 MG PO TABS: 100 mg | ORAL | @ 17:00:00 | Stop: 2017-02-22 | NDC 00536440801

## 2017-02-11 MED ADMIN — HYDROCODONE-ACETAMINOPHEN 5-325 MG PO TABS: 2 | ORAL | @ 20:00:00 | Stop: 2017-02-22 | NDC 00406012362

## 2017-02-11 MED ADMIN — ACYCLOVIR 400 MG PO TABS: 400 mg | ORAL | @ 17:00:00 | Stop: 2017-02-12 | NDC 00904579061

## 2017-02-11 MED ADMIN — ACYCLOVIR 400 MG PO TABS: 400 mg | ORAL | @ 04:00:00 | Stop: 2017-02-12 | NDC 00904579061

## 2017-02-11 MED ADMIN — GABAPENTIN 400 MG PO CAPS: 800 mg | ORAL | @ 20:00:00 | Stop: 2017-02-22 | NDC 68084077401

## 2017-02-11 MED ADMIN — GABAPENTIN 400 MG PO CAPS: 800 mg | ORAL | @ 04:00:00 | Stop: 2017-02-22 | NDC 68084077401

## 2017-02-11 MED ADMIN — INSULIN ASPART 100 UNIT/ML SC SOPN: 3 mL | SUBCUTANEOUS | @ 20:00:00 | Stop: 2017-02-22 | NDC 00169633910

## 2017-02-11 MED ADMIN — HYDROCODONE-ACETAMINOPHEN 5-325 MG PO TABS: 2 | ORAL | @ 07:00:00 | Stop: 2017-02-22 | NDC 00406012362

## 2017-02-11 MED ADMIN — BUDESONIDE-FORMOTEROL FUMARATE 160-4.5 MCG/ACT IN AERO: 2 | RESPIRATORY_TRACT | @ 04:00:00 | Stop: 2017-02-22 | NDC 00186037028

## 2017-02-11 MED ADMIN — BUDESONIDE-FORMOTEROL FUMARATE 160-4.5 MCG/ACT IN AERO: 2 | RESPIRATORY_TRACT | @ 17:00:00 | Stop: 2017-02-22

## 2017-02-11 MED ADMIN — ALBUTEROL SULFATE (2.5 MG/3ML) 0.083% IN NEBU: 2.5 mg | RESPIRATORY_TRACT | @ 13:00:00 | Stop: 2017-02-14 | NDC 00487950101

## 2017-02-11 MED ADMIN — PRAVASTATIN SODIUM 40 MG PO TABS: 40 mg | ORAL | @ 04:00:00 | Stop: 2017-02-22 | NDC 51079078220

## 2017-02-11 MED ADMIN — HYDROCODONE-ACETAMINOPHEN 5-325 MG PO TABS: 2 | ORAL | @ 14:00:00 | Stop: 2017-02-22 | NDC 00406012362

## 2017-02-11 MED ADMIN — METRONIDAZOLE 250 MG PO TABS: 500 mg | ORAL | @ 04:00:00 | Stop: 2017-02-13 | NDC 00904145361

## 2017-02-11 MED ADMIN — GABAPENTIN 400 MG PO CAPS: 800 mg | ORAL | @ 14:00:00 | Stop: 2017-02-22 | NDC 68084077401

## 2017-02-11 MED ADMIN — ALBUTEROL SULFATE (2.5 MG/3ML) 0.083% IN NEBU: 2.5 mg | RESPIRATORY_TRACT | @ 20:00:00 | Stop: 2017-02-14 | NDC 00487950101

## 2017-02-11 MED ADMIN — ALBUTEROL SULFATE (2.5 MG/3ML) 0.083% IN NEBU: 2.5 mg | RESPIRATORY_TRACT | @ 04:00:00 | Stop: 2017-02-14

## 2017-02-11 MED ADMIN — DIPHENHYDRAMINE-ZINC ACETATE 1-0.1 % EX CREA: TOPICAL | @ 08:00:00 | Stop: 2017-02-22

## 2017-02-11 MED ADMIN — VORICONAZOLE 200 MG PO TABS: 200 mg | ORAL | @ 04:00:00 | Stop: 2017-02-11 | NDC 00904647104

## 2017-02-11 MED ADMIN — DOCUSATE SODIUM 100 MG PO CAPS: 100 mg | ORAL | @ 17:00:00 | Stop: 2017-02-22 | NDC 00904645561

## 2017-02-11 MED ADMIN — METRONIDAZOLE 250 MG PO TABS: 500 mg | ORAL | @ 17:00:00 | Stop: 2017-02-13 | NDC 00904145361

## 2017-02-11 MED ADMIN — IPRATROPIUM BROMIDE 0.02 % IN SOLN: 500 ug | RESPIRATORY_TRACT | @ 13:00:00 | Stop: 2017-02-19 | NDC 00487980101

## 2017-02-11 MED ADMIN — IPRATROPIUM BROMIDE 0.02 % IN SOLN: 500 ug | RESPIRATORY_TRACT | @ 20:00:00 | Stop: 2017-02-19 | NDC 00487980101

## 2017-02-11 MED ADMIN — FUROSEMIDE 10 MG/ML IJ SOLN: 40 mg | INTRAVENOUS | @ 04:00:00 | Stop: 2017-02-11 | NDC 36000028325

## 2017-02-11 NOTE — Progress Notes
MICU Transfer Note    Patient Name: Ryan Shepard  MRN: [id number]  DOB: 1940/12/26  PMD: Wilburn Mylar, MD  Date of Admission: 02/01/2017    Date of Service: 02/11/2017  Hospital Day: 10     Reason for Admission: Mixed hypoxemic/hypercapnic respiratory failure    Subjective:   Ryan Shepard???is a 76 y.o.???male???with history of COPD (on baseline 3L), grade I diastolic dysfunction, atrial fibrillation (on apixaban), HTN, DM2, PAD s/p femoral graft, GERD admitted to [facility]  12/6 for dyspnea with LUL consolidation; transferred to MICU 12/13 for worsening mixed hypoxemic/hypercapneic respiratory failure, now returning to Medicine for continued management.    Patient presented to [facility] ED 12/6 with one week dyspnea; initial exam notable for labored breathing, wheezing, lower extremity edema; CXR notable for LUL consolidation. Treated multifactorially for CAP (levofloxacin 12/6-12), COPD exacerbation (prednisone 12/6-10), volume overload (daily lasix, total net -5L). Admission sputum cultures subsequently returned positive for aspergillus, with CT chest demonstrating biapical L>R consolidations with bulky intrathoracic lymphadenopathy. ID and Pulmonology consulted for further workup, and bronchoscopy with BAL completed 12/13. Patient following biopsy developed increasing oxygen requirements with hypercapnia (50 -> 70) and relative hypotension prompting MICU transfer.    Patient upon transfer was placed on HFNC 40L/40% with improvement in work of breathing. Treated empirically with vanc/zosyn/voriconazole (12/13-14) as well as scheduled nebulizers and gentle diuresis. Maintained stable O2 requirement and well-compensated hypercapnia over 48h monitoring. Given clinical stability and negative cultures, antibiotics narrowed to ceftriaxone/flagyl (12/15); voriconazole discontinued 12/16 per ID recommendations. Patient moved to floor 12/15 on HFNC 35L/40%, transferred to Medicine 12/16 for continued O2 weaning and IV antibiotics for suspected bacterial pneumonia as well as consideration of further workup for underlying lung pathology (fungal vs malignant).     Objective:   Vital Signs:  Temp:  [36 ???C (96.8 ???F)-37.1 ???C (98.8 ???F)] 36.3 ???C (97.3 ???F)  Heart Rate:  [76-93] 78  Resp:  [10-24] 19  BP: (107-165)/(58-88) 165/68  NBP Mean:  [74-96] 96  FiO2 (%):  [40 %] 40 %  SpO2:  [89 %-100 %] 95 %      Intake/Output Summary (Last 24 hours) at 02/11/17 1408  Last data filed at 02/11/17 1478   Gross per 24 hour   Intake            762.5 ml   Output             2500 ml   Net          -1737.5 ml       FiO2 (%):  [40 %] 40 %    Physical Exam:  General: Elderly male, frail but in NAD, appears comfortable  CV: Regular rate/rhythm, normal s1/s2, no JVD  Pulm: Left lung sounds diminished, right side coarse, with faint bibasilar crackles, nonlabored on HFNC  Abd: Soft, nontender, nondistended  LE: No LE edema, warm/well-perfused    Medications:  Scheduled:   ??? acyclovir  400 mg Oral TID   ??? albuterol  2.5 mg Nebulization Q4H   ??? apixaban  5 mg Oral BID   ??? budesonide-formoterol  2 puff Inhalation BID   ??? busPIRone  15 mg Oral BID   ??? carvedilol  12.5 mg Oral BID   ??? cefTRIAXone  1 g Intravenous Q24H   ??? clopidogrel  75 mg Oral Daily   ??? docusate  100 mg Oral BID   ??? DULoxetine  60 mg Oral Daily   ??? gabapentin  800 mg Oral TID   ???  ipratropium  0.5 mg Nebulization Q4H   ??? metroNIDAZOLE  500 mg Oral TID   ??? pantoprazole  40 mg Oral Daily   ??? polyethylene glycol  17 g Oral Daily   ??? pravastatin  40 mg Oral QHS   ??? pyridoxine  100 mg Oral Daily   ??? senna  1 tablet Oral QHS     Continuous Transfusions:   ??? sodium chloride       PRN: insulin aspart **AND** dextrose, diphenhydrAMINE, HYDROcodone-acetaminophen, LORazepam    Labs:  CBC:    Chemistry:Na/K/Cl/CO2/BUN/Cr/glu:  141/4.1/98/31/17/0.96/109 (12/16 0539)  Coags:    LFTS:    ABG:      POC Glucose:   Recent Labs      02/11/17   1216  02/10/17   2243  02/10/17   1805  02/10/17   1133 GLUCOSEPOC  134*  121*  187*  169*       Microbiology:   12/13 BAL:    AFB NTD    Bact NTD    Fungal: Candida    GS: negative    Biofire: negative  Aspergillus Fumigatus 02/05/17: <0.10  Cryptococcal Ag blood 02/06/17: negative  Bacterial Culture Respiratory 02/01/17: Few Probable candida albicans , Aspergillus [place]???   MRSA nares 02/01/17: No Methicillin-resistant Staphylococcus aureus isolated.   Sputum screen 02/01/17: <10 Epithelial cells per low power field   Resp bacterial Cx 02/01/17: <10 Epithelial cells per low power field   Legionella urine 02/01/17: negative     Imaging:  CXR 12/15   Severe bilateral airspace consolidation greatest in the left perihilar region and bilateral lower lobes with diffuse increase in interstitial markings, likely a combination of multifocal pneumonia and edema.  Moderate left and small right pleural effusions.  Increased soft tissue density in the right paratracheal region, consistent with adenopathy.  Normal heart size.  No significant osseous abnormalities.    Problem Based Assessment and Plan     Ryan Shepard???is a 76 y.o.???male???with history of COPD (on baseline 3L), grade I diastolic dysfunction, atrial fibrillation (on apixaban), HTN, DM2, PAD s/p femoral graft, GERD admitted to [facility]  12/6 for dyspnea with LUL consolidation; transferred to MICU 12/13 for worsening mixed hypoxemic/hypercapneic respiratory failure.  ???  #Mixed hypoxemic/hypercapneic respiratory failure. Acute hypoxemia with increasing O2 requirement and hypercapnia following bronchoscopy. Highest concern for post-procedural inflammation/bronchoconstriction, however differential also includes acute bacterial pneumonia including aspiration in setting of sedation. Initial concern for  pulmonary aspergillosis given positive sputum cultures on admission, however BAL cultures remain negative (+candida only, likely colonizer) with negative galactomanan less suggestive of pathogenic infection. Fungal infection and malignancy remain on differential for chronic respiratory symptoms with prominent LUL opacity and lymphadenopathy.  - HFNC 40L/40% for goal FiO2>88%; wean to NC as tolerated  - q24h VBG for monitoring of ventilatory status  - s/p vanc (12/14), zosyn (12/14-12/15); continue ctx/flagyl (12/15-20) planned 7-day total abx course  - s/p voriconazole (12/14-16); held per ID recs given BAL cultures NTD  - albuterol/ipratropium nebs q4h  - symbicort BID  - Pulmonology consulted for further workup of lung mass/LAD, likely as outpatient pending clinical course  ??????  #HFpEF. Grade I diastolic dysfunction per 01/2017 TTE, EF 70%. Evidence of pulmonary edema on CXR following MICU transfer.  - IV lasix 40mg  PRN for goal net -500cc to -1L  ???  #Afib. Rate-controlled, anticoagulated.   - carvedilol 25 mg???BID (restarted 12/16 given stable to high BP)  - apixaban 5 mg BID  ???  ???  #  HTN. Relatively hypotensive on transfer, though without signs of end-organ hypoperfusion, MAP maintained >70.   - hold losartan 100 mg daily; consider restart 12/17 if BP stable on home carvedilol  ???  #DM2. BG at goal.   - insulin sliding scale algorithm #3  - gabapentin 800 mg ???TID   ???  #PAD. S/p femoral-femoral graft 2009.  - clopidogrel 75 mg daily   ???  #HLD chronic/stable  - pravastatin 40 mg nightly   ???  #genital HSV. With active flare, newly diagnosed.   - acyclovir 400mg  po TID (12/10-12/16)  ???  #GERD chronic/stable  - pantoprazole 40 mg daily   ???  #depression/ anxiety  - buspirone 15 mg BID  - duloxetine DR 60   - lorazepam 2 mg PRN BID  ???  #chornic pain. Localized to lower back and lower extremities  - norco 5-325 q6h PRN    Inpatient Checklist  Diet: Diet 2 gram sodium  Diet carbohydrate controlled  DVT Prophylaxis: apixaban  GI Prophylaxis: PPI  Central Lines: None   Tubes/Drains: None     CODE STATUS: Full Code confirmed 12/13  CONTACT: Extended Emergency Contact Information Primary Emergency Contact: Katayama,Johnnie  Address: [address]           [place], [place] [address] [place]  Home Phone: [phone]  Mobile Phone: [phone]  Relation: Spouse  Secondary Emergency Contact: Marcina Millard  Address: [address]                          [place] [address] [place]  Home Phone: [phone]  Work Phone: [phone]  Relation: Friend    DWA Dr. Kirke Shaggy, Ewing Schlein., MD    Celestia Khat, MD  PGY2, Internal Medicine

## 2017-02-11 NOTE — Other
Patients Clinical Goal:   Clinical Goal(s) for the Shift: Admit to unit, pain control, SPO2>88%, Afebrile  Identify possible barriers to advancing the care plan:   Stability of the patient: Moderately Stable - low risk of patient condition declining or worsening   End of Shift Summary: Received patient from 4ICU. Patient alert and oriented x4, accompanied by wife. Placed on 40%FIO2, 40L, patient tolerating well. Had a Tmax of 37.1. At 2236 patient complained of chest pain, MD notified with stable vital signs. EKG ordered and done. Lasix 40mg  IVP ordered given. Generalized pain managed with Norco PO x2 with relief. VBGs ordered and collected. Wife at bedside all night.  Call bell within reach, will continue to monitor.

## 2017-02-11 NOTE — Nursing Note
Report was given to [facility] RN, patient is transfered to [phone] with escort. Wife is at bedside.

## 2017-02-11 NOTE — Progress Notes
MICU Progress Note    Patient Name: Ryan Shepard  MRN: [id number]  DOB: 28-Apr-1940  PMD: Wilburn Mylar, MD  Date of Admission: 02/01/2017    Date of Service: 02/10/2017  Hospital Day: 9     Reason for Admission: <principal problem not specified>      Subjective:   24-hour Course/Interval Events:   - Weaning down on HFNC  - Tolerating POs and OOBTC    Subjective:  - Pt reports breathing is stable  - Denies pain    Review of Systems:  14 point review of systems was unremarkable except for pertinent findings as stated above.    Objective:   Vital Signs:  Temp:  [36 ???C (96.8 ???F)-37.1 ???C (98.8 ???F)] 36 ???C (96.8 ???F)  Heart Rate:  [64-93] 93  Resp:  [12-25] 24  BP: (81-117)/(49-88) 117/72  NBP Mean:  [60-96] 86  FiO2 (%):  [40 %-50 %] 40 %  SpO2:  [88 %-95 %] 91 %      Intake/Output Summary (Last 24 hours) at 02/10/17 2001  Last data filed at 02/10/17 1900   Gross per 24 hour   Intake             1815 ml   Output             1775 ml   Net               40 ml       FiO2 (%):  [40 %-50 %] 40 %    Physical Exam:  General: Appears comfortable, normal work of breathing  CV: Regular rate, normal s1/s2  Pulm: Course breath sounds bilaterally, faint bibasilar crackles  Abd: Soft, nontender  LE: No LE edema, wwp    Medications:  Scheduled:   ??? acyclovir  400 mg Oral TID   ??? albuterol  2.5 mg Nebulization Q4H   ??? apixaban  5 mg Oral BID   ??? budesonide-formoterol  2 puff Inhalation BID   ??? busPIRone  15 mg Oral BID   ??? cefTRIAXone  1 g Intravenous Q24H   ??? clopidogrel  75 mg Oral Daily   ??? docusate  100 mg Oral BID   ??? DULoxetine  60 mg Oral Daily   ??? furosemide  40 mg Intravenous Once   ??? gabapentin  800 mg Oral TID   ??? ipratropium  0.5 mg Nebulization Q4H   ??? metroNIDAZOLE  500 mg Oral TID   ??? pantoprazole  40 mg Oral Daily   ??? polyethylene glycol  17 g Oral Daily   ??? pravastatin  40 mg Oral QHS   ??? pyridoxine  100 mg Oral Daily   ??? senna  1 tablet Oral QHS   ??? voriconazole  200 mg Oral Q12H     Continuous Transfusions: ??? sodium chloride       PRN: insulin aspart **AND** dextrose, diphenhydrAMINE, HYDROcodone-acetaminophen, LORazepam    Labs:  CBC:    Chemistry:Na/K/Cl/CO2/BUN/Cr/glu:  137/4.9/98/29/21/0.96/158 (12/15 0345)  Coags:    LFTS:    ABG:      POC Glucose:   Recent Labs      02/10/17   1805  02/10/17   1133  02/10/17   0634  02/09/17   2059   GLUCOSEPOC  187*  169*  116*  97       Microbiology:   12/13 BAL:    AFB NTD    Bact NTD    Fungal: Yeast, not speciated  GS: negative    Biofire: negative  Aspergillus Fumigatus 02/05/17: <0.10  Cryptococcal Ag blood 02/06/17: negative  Bacterial Culture Respiratory 02/01/17: Few Probable candida albicans , Aspergillus [place]???   MRSA nares 02/01/17: No Methicillin-resistant Staphylococcus aureus isolated.   Sputum screen 02/01/17: <10 Epithelial cells per low power field   Resp bacterial Cx 02/01/17: <10 Epithelial cells per low power field   Legionella urine 02/01/17: negative     Imaging:  CXR with worsening diffuse alveolar opacification compared to day prior     Problem Based Assessment and Plan     Ryan Shepard???is a 76 y.o.???male???with history of COPD (on baseline 3L), grade I diastolic dysfunction, atrial fibrillation (on apixaban), HTN, DM2, PAD s/p femoral graft, GERD admitted to [facility]  12/6 for dyspnea with LUL consolidation; transferred to MICU 12/13 for worsening mixed hypoxemic/hypercapneic respiratory failure.  ???  #Mixed hypoxemic/hypercapneic respiratory failure. Acute hypoxemia with increasing O2 requirement and hypercapnia following bronchoscopy. Highest concern for post-procedural inflammation/bronchoconstriction, however differential also includes pulmonary aspergillosis given prominent LUL opacity with associated lymphadenopathy and  positive fungal/aspergillus cultures. Also considered acute bacterial pneumonia, though lower suspicion in absence of SIRS, negative lactate/procal.  - HFNC 40L/40% for goal FiO2>88% - q4h VBG, transition to bipap if worsening hypercapnia  - albuterol/ipratropium nebs q4h  - vanc (12/14), zosyn (12/14-12/15), ctx (12/15- ), flagyl (12/15- )  - empiric voriconazole (12/14-) with plan for ID consultation regarding continued course pending BAL cultures  ??????  #HFpEF. Grade I diastolic dysfunction per 01/2017 TTE, EF 70%. Euvolemic on transfer to MICU.   - s/p 500cc LR; repeat as tolerated for goal SBP>90  ???  #Afib. Rate-controlled, anticoagulated.   - hold carvedilol 25 mg daily ???BID   - apixaban 5 mg BID  ???  ???  #HTN. Relatively hypotensive on transfer, though without signs of end-organ hypoperfusion, MAP maintained >70.   - hold losartan 100 mg daily   ???  #DM2. BG at goal.   - insulin sliding scale algorithm #3  - continue home gabapentin 800 mg ???TID   ???  #PAD. S/p femoral-femoral graft 2009.  - clopidogrel 75 mg daily   ???  #HLD chronic/stable  - pravastatin 40 mg nightly   ???  #genital HSV. With active flare, newly diagnosed.   - acyclovir 400mg  po TID (12/10-12/17)  ???  #GERD chronic/stable  - pantoprazole 40 mg daily   ???  #depression/ anxiety  - buspirone 15 mg BID  - duloxetine DR 60   - lorazepam 2 mg PRN BID  ???  #chornic pain. Localized to lower back and lower extremities      Active Problems:    Lower back pain POA: Yes    OA (osteoarthritis) of knee POA: Yes    Primary osteoarthritis of knees, bilateral POA: Yes    COPD exacerbation (HCC/RAF) POA: Yes    COPD (chronic obstructive pulmonary disease) (HCC/RAF) POA: Yes    Chronic prescription opiate use POA: Not Applicable    Diabetes (HCC/RAF) POA: Yes    PAD (peripheral artery disease) (HCC/RAF) POA: Yes    Hypertension POA: Yes    Hypercholesteremia POA: Yes    GERD (gastroesophageal reflux disease) POA: Yes    Pneumonia POA: Unknown    Lower extremity edema POA: Unknown    Memory loss POA: Unknown    Genital herpes POA: Clinically Undetermined      Inpatient Checklist  Diet: Diet 2 gram sodium  Diet carbohydrate controlled DVT Prophylaxis: heparin SQ  GI  Prophylaxis: PPI  Central Lines: None   Tubes/Drains: None     CODE STATUS: Full Code  CONTACT: Extended Emergency Contact Information  Primary Emergency Contact: Mills,Johnnie  Address: [address]           [place], [place] [address] [place]  Home Phone: [phone]  Mobile Phone: [phone]  Relation: Spouse  Secondary Emergency Contact: Marcina Millard  Address: [address]                          [place] [address] [place]  Home Phone: [phone]  Work Phone: [phone]  Relation: Friend    DWA Dr. Kirke Shaggy, Ewing Schlein., MD    Sampson Goon, MD  PGY-1, Internal Medicine    I saw and evaluated  Ryan Shepard.  I discussed the case with the resident and agree with the findings and plan of care as documented in the resident's note.       Tolerating HFNC well.  Continue antibiotics for full course.        Quanna Wittke N. Dalya Maselli

## 2017-02-11 NOTE — Progress Notes
INPATIENT INFECTIOUS DISEASE CONSULTATION    PATIENT:  Ryan Shepard  MRN:  [id number]  DOB:  [birth year]  DATE OF ADMISSION: 02/01/2017    DATE OF SERVICE:  02/11/2017  REQUESTING PHYSICIAN: Internal Medicine  REASON FOR CONSULTATION: Aspergillus     Chief Complaint:  SOB    History of Present Illness:   Ryan Shepard is a 76 y/o M with COPD, tobacco use disorder, PAD s/p femoral graft, HTN, DM2, HLD, Afib, GERD, depression, anxiety, chronic pain presenting for intermittent SOB.     12/6: Admitted with a COPD exacerbation in the setting of diagnosed COPD and extensive tobacco use disorder. CXR on admission showing confluent airspace opacities c/f multifocal aspiration/pneumonia. Procal neg. Treated with levaquin (12/6- ) x 7 days and 5 day course of prednisone.   12/11: Patient was planned for discharge but then was found on bacterial resp culture from admission (12/6) to have aspergillus species, few probably candida. Other micro including legionella urine ag neg, MRSA nares neg, HIV neg. Had CT chest done showing airspace and nodular consolidation within the left upper lobe and lingula and lesser within the right upper and right lower lobes consistent with pneumonia, likely fungal. Also with bulky intrathoracic multicompartamental lymphadenopathy.     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 Started on acyclovir 800 mg BID x 5 days. Gonorrhea/chlamydia neg. RPR pending. Bacterial culture no growth to date. HIV neg.     12/12: VSS, no other acute changes. Requiring 3 L NC. No changes in respiratory status.   12/13: VSS. No acute changes. Pulmonology consulted, recommended bronch, which will be performed today. Pt states his SOB has improved drastically. No fevers, chills, nausea, vomiting. No new culture data.  12/14 Events noted; the patient became increasingly hypoxic after the bronchoscopy despite using a 15 liter face mask.  The blood also noted hypercapnia.  According to the notes the patient reported worsening cough associated blood tinged sputum, but today he claims there was no change in his breath or cough.  He was transferred to ICU for further monitoring and start on voriconazole, piperacillin-tazobactam and vancomycin.  He denies fever, chills chest pain or changes in his breathing.  12/15: no acute events. Remains afebrile with normal WBC. Reports he is coughing more today but breathing feels better.   12/16: Transferred to the floor, afebrile    Allergies:   No Known Allergies    ID Medications:   Anti-infective Meds       Disp Refills Start End    acyclovir 800 mg tablet 8 tablet 0 02/05/2017     Sig - Route: Take 1 tablet (800 mg total) by mouth two (2) times daily. - Oral    Cosign for Ordering: Accepted by Pura Spice., MD on 02/05/2017  8:46 PM      Hospital Medications       Dose Frequency Start End    acyclovir tab 400 mg 400 mg 3 times daily (Peds) 02/10/2017 02/12/2017    Sig - Route: Take 1 tablet (400 mg total) by mouth three (3) times a day. - Oral    cefTRIAXone 1 g in dextrose 5% 50 mL IVPB RTU 1 g Every 24 hours 02/10/2017 02/17/2017    Sig - Route: Inject 50 mLs (1 g total) into the vein daily. - Intravenous    metroNIDAZOLE tab 500 mg 500 mg 3 times  daily (Peds) 02/10/2017 02/17/2017    Sig - Route: Take 2 tablets (500 mg total) by mouth three (3) times a day. - Oral    voriconazole tab 200 mg 200 mg Every 12 hours 02/10/2017 02/17/2017    Sig - Route: Take 1 tablet (200 mg total) by mouth every twelve (12) hours. - Oral          Objective:  Vital Signs summary (past 24 hours):  Temp:  [36 ???C (96.8 ???F)-37.1 ???C (98.8 ???F)] 36.3 ???C (97.3 ???F)  Heart Rate:  [76-93] 83  Resp:  [10-24] 19  BP: (99-148)/(58-88) 137/72  NBP Mean:  [74-96] 88  FiO2 (%):  [40 %-50 %] 40 %  SpO2:  [88 %-100 %] 100 % Temp (24hrs), Avg:36.5 ???C (97.7 ???F), Min:36 ???C (96.8 ???F), Max:37.1 ???C (98.8 ???F)    Physical Exam:   Gen: lying in bed on high-flow nasal O2, saturating 87-95%  HEENT: PERRL, EOMI  CV: normal S1/S2, no murmurs   Pulmonary: decreased breath sounds on left with rhonchi, clearer on right  GI: soft, BS+, not tender to light palpation,   Skin: no rashes, normal turgor  Neuro: awake, alert, moving all extremities  Psych: appropriate mood/affect, appropriate judgment/insight    Lab Tests/ Studies:  Recent Labs      02/09/17   0559  02/08/17   1855   WBC  6.92  8.58   HGB  13.1*  13.3*   HCT  40.9  40.8   MCV  88.9  90.1   PLT  109*  118*       Recent Labs      02/11/17   0539  02/10/17   0345  02/09/17   0309  02/08/17   1855   NA  141  137  138  137   K  4.1  4.9  4.4  4.8   CL  98  98  98  96   CO2  31*  29  29  31*   BUN  17  21  21   27*   CREAT  0.96  0.96  1.00  1.15   CALCIUM  8.8  8.5*  8.7  8.9   MG  1.5  1.7  1.8  2.1*   PHOS   --    --    --   3.4       Recent Labs      02/08/17   1855   TOTPRO  5.8*   ALBUMIN  2.8*   BILITOT  0.4   BILICON  <0.2   ALT  18   AST  20   ALKPHOS  50       No results found for: Antoine Primas  12/14 Lactate 19    Microbiology:   12/13 respiratory culture: candida, therwise negative so far; AFB smear negative  12/13 legionella culutre pending  Aspergillus Ag EIA -  <0.50  RVP panel negative  Aspergillus fumigatus (M3) IgE - NEG   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+  Bacterial culture gram stain from vesicle - Coag neg staph like colonies, few enteroccocus like colonies  Cryptococcal Ag - neg  Cocci EIA/Ag - Negative  12/6 Sputum few Candida and Aspergillus species, few normal flora  MRSA screen negative    HIstoAg urine: NOT SENT  Histo antigen serum: NOT SENT  Beta glucan: Pending  MTB PCR: NOT SENT    HIV  - neg  Legionella Urinary Ag - neg  Chlamydia/gonorrhea PCR - neg  RPR nonreactive Imaging reviewed:  12/13 Chest x-ray: There are large masslike areas of confluent consolidation in the left superhilar region and extensive mediastinal adenopathy. There are less well-defined areas of airway thickening consolidation throughout the remainder of the left lung and at the right   lung base    CT chest w/o contrast 12/10 reviewed with staff radiologist  IMPRESSION:  Atherosclerotic calcification of the thoracic aorta, brachiocephalic, bilateral common carotid, bilateral subclavian and bilateral axillary, left anterior descending, circumflex and right coronary arteries. Enlargement of the central pulmonary arteries, the main pulmonary artery currently 39 mm in maximal axial diameter, with associated calcifications, consistent with pulmonary arterial hypertension. Small left pleural effusion. Small pericardial effusion.  Moderate smoking-related centrilobular emphysema.  Coalescent airspace and nodular consolidation within the left upper lobe and lingula, and to a lesser extent, within the right upper and right lower lobes, consistent with pneumonia, likely fungal. Associated bulky intrathoracic multicompartmental   lymphadenopathy; for example, AP window lymph node, currently 38 x 23 mm (3-40).    Assessment:  Ryan Shepard is a 76 y/o M with COPD, tobacco use d/o, PAD presenting with SOB and penile lesions.    #Hypoxia and respiratory failure  #Aspergillus on resp cultures 12/6  #Vesciluar Penile lesions, HSV2+  #COPD Exacerbation   #Afib  #PAD  #HTN  #HLD  #GERD  #DM2    No clear evidence for an infectious cause for his respiratory decompensation post-procedure.  The chest x-ray is relatively unchanged for December 9th and he is afebrile without a leukocytosis. However given extent of disease seen on imaging, would favor empirically treating for now for common bacterial pathogens as well as aspergillus, pending finalization of recent BAL cultures. MRSA nares negative, and thus unlikely he has MRSA pneumonia.     Recommendations / Plan:  - Continue Ceftriaxone 1g q24hrs and Flagyl 500mg  TID. Plan for total 7-day course, dated from initiation of pip-tazo on 12/14.  - continue Voriconazole(change dose to 200mg  BID) pending fungal cultures. If aspergillus does not grow from BAL after 72 hours DC the voriconazole as the aspergillus likely represents colonization of the upper airway.  - Follow up BAL cultures  - Discontinue acyclovir 400 mg TID for genital herpes    We will sign off at this time. Please page [phone] with any questions.       Author:  Susy Frizzle. Cato Mulligan, MD, PhD 02/11/2017 11:44 AM

## 2017-02-11 NOTE — Other
Patients Clinical Goal:   Clinical Goal(s) for the Shift: 1. Wean HFNC, maintaining SpO2>88%, 2. VSS at baseline, 3. PO intake>75%, 4. BG management, 5. Maintain comfort/safety.   Identify possible barriers to advancing the care plan: desaturation  Stability of the patient: Moderately Unstable - medium risk of patient condition declining or worsening    End of Shift Summary: HFNC was weaned from 50%/40L to 40%/40L during this shift, maintaining SpO2>88%. VBGs were at baseline value. VSS. Pt had good appetite completing the meals. BG was requiring insulin coverage, but not significantly high today. Pain was controlled with Norco Q6 and Pt remained pleasant, comfortable, and safe, throughout the shift. No complication noted.

## 2017-02-11 NOTE — Progress Notes
INPATIENT INFECTIOUS DISEASE CONSULTATION    PATIENT:  Ryan Shepard  MRN:  [id number]  DOB:  1940-11-01  DATE OF ADMISSION: 02/01/2017    DATE OF SERVICE:  02/10/2017  REQUESTING PHYSICIAN: Internal Medicine  REASON FOR CONSULTATION: Aspergillus     Chief Complaint:  SOB    History of Present Illness:   Ryan Shepard is a 76 y/o M with COPD, tobacco use disorder, PAD s/p femoral graft, HTN, DM2, HLD, Afib, GERD, depression, anxiety, chronic pain presenting for intermittent SOB.     12/6: Admitted with a COPD exacerbation in the setting of diagnosed COPD and extensive tobacco use disorder. CXR on admission showing confluent airspace opacities c/f multifocal aspiration/pneumonia. Procal neg. Treated with levaquin (12/6- ) x 7 days and 5 day course of prednisone.   12/11: Patient was planned for discharge but then was found on bacterial resp culture from admission (12/6) to have aspergillus species, few probably candida. Other micro including legionella urine ag neg, MRSA nares neg, HIV neg. Had CT chest done showing airspace and nodular consolidation within the left upper lobe and lingula and lesser within the right upper and right lower lobes consistent with pneumonia, likely fungal. Also with bulky intrathoracic multicompartamental lymphadenopathy.     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 Started on acyclovir 800 mg BID x 5 days. Gonorrhea/chlamydia neg. RPR pending. Bacterial culture no growth to date. HIV neg.     12/12: VSS, no other acute changes. Requiring 3 L NC. No changes in respiratory status.   12/13: VSS. No acute changes. Pulmonology consulted, recommended bronch, which will be performed today. Pt states his SOB has improved drastically. No fevers, chills, nausea, vomiting. No new culture data.  12/14 Events noted; the patient became increasingly hypoxic after the bronchoscopy despite using a 15 liter face mask.  The blood also noted hypercapnia.  According to the notes the patient reported worsening cough associated blood tinged sputum, but today he claims there was no change in his breath or cough.  He was transferred to ICU for further monitoring and start on voriconazole, piperacillin-tazobactam and vancomycin.  He denies fever, chills chest pain or changes in his breathing.  12/15: no acute events. Remains afebrile with normal WBC. Reports he is coughing more today but breathing feels better.     Allergies:   No Known Allergies    ID Medications:   Anti-infective Meds       Disp Refills Start End    acyclovir 800 mg tablet 8 tablet 0 02/05/2017     Sig - Route: Take 1 tablet (800 mg total) by mouth two (2) times daily. - Oral    Cosign for Ordering: Accepted by Pura Spice., MD on 02/05/2017  8:46 PM      Hospital Medications       Dose Frequency Start End    acyclovir tab 400 mg 400 mg 3 times daily 02/10/2017 02/18/2017    Sig - Route: Take 1 tablet (400 mg total) by mouth three (3) times daily. - Oral    piperacillin/tazobactam 3.375 g in dextrose 5% 50 mL IVPB RTU 3.375 g Every 8 hours provider specified 02/08/2017 02/16/2017    Sig - Route: Inject 50 mLs (3.375 g total) into the vein every eight (8) hours. - Intravenous    Linked Group 1:  ''And'' Linked Group Details  Objective:  Vital Signs summary (past 24 hours):  Temp:  [36 ???C (96.8 ???F)-37.1 ???C (98.8 ???F)] 36 ???C (96.8 ???F)  Heart Rate:  [64-89] 87  Resp:  [12-25] 18  BP: (81-116)/(49-88) 110/88  NBP Mean:  [60-96] 96  FiO2 (%):  [40 %-50 %] 40 %  SpO2:  [88 %-95 %] 89 %  Temp (24hrs), Avg:36.6 ???C (97.8 ???F), Min:36 ???C (96.8 ???F), Max:37.1 ???C (98.8 ???F)    Physical Exam:   Gen: lying in bed on high-flow nasal O2, saturating 87-95%  HEENT: PERRL, EOMI  CV: normal S1/S2, no murmurs   Pulmonary: decreased breath sounds on left with rhonchi, clearer on right  GI: soft, BS+, not tender to light palpation, Skin: no rashes, normal turgor  Neuro: awake, alert, moving all extremities  Psych: appropriate mood/affect, appropriate judgment/insight    Lab Tests/ Studies:  Recent Labs      02/09/17   0559  02/08/17   1855   WBC  6.92  8.58   HGB  13.1*  13.3*   HCT  40.9  40.8   MCV  88.9  90.1   PLT  109*  118*       Recent Labs      02/10/17   0345  02/09/17   0309  02/08/17   1855   NA  137  138  137   K  4.9  4.4  4.8   CL  98  98  96   CO2  29  29  31*   BUN  21  21  27*   CREAT  0.96  1.00  1.15   CALCIUM  8.5*  8.7  8.9   MG  1.7  1.8  2.1*   PHOS   --    --   3.4       Recent Labs      02/08/17   1855   TOTPRO  5.8*   ALBUMIN  2.8*   BILITOT  0.4   BILICON  <0.2   ALT  18   AST  20   ALKPHOS  50       No results found for: Antoine Primas  12/14 Lactate 19    Microbiology:   12/13 respiratory culture: candida, therwise negative so far; AFB smear negative  12/13 legionella culutre pending  Aspergillus Ag EIA -  <0.50  RVP panel negative  Aspergillus fumigatus (M3) IgE - NEG   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+  Bacterial culture gram stain from vesicle - Coag neg staph like colonies, few enteroccocus like colonies  Cryptococcal Ag - neg  Cocci EIA/Ag - Negative  12/6 Sputum few Candida and Aspergillus species, few normal flora  MRSA screen negative    HIstoAg urine: NOT SENT  Histo antigen serum: NOT SENT  Beta glucan: Pending  MTB PCR: NOT SENT    HIV  - neg  Legionella Urinary Ag - neg  Chlamydia/gonorrhea PCR - neg  RPR nonreactive    Imaging reviewed:  12/13 Chest x-ray: There are large masslike areas of confluent consolidation in the left superhilar region and extensive mediastinal adenopathy. There are less well-defined areas of airway thickening consolidation throughout the remainder of the left lung and at the right   lung base    CT chest w/o contrast 12/10 reviewed with staff radiologist  IMPRESSION: Atherosclerotic calcification of the thoracic aorta, brachiocephalic, bilateral common carotid, bilateral subclavian and bilateral axillary, left  anterior descending, circumflex and right coronary arteries. Enlargement of the central pulmonary arteries, the main pulmonary artery currently 39 mm in maximal axial diameter, with associated calcifications, consistent with pulmonary arterial hypertension. Small left pleural effusion. Small pericardial effusion.  Moderate smoking-related centrilobular emphysema.  Coalescent airspace and nodular consolidation within the left upper lobe and lingula, and to a lesser extent, within the right upper and right lower lobes, consistent with pneumonia, likely fungal. Associated bulky intrathoracic multicompartmental   lymphadenopathy; for example, AP window lymph node, currently 38 x 23 mm (3-40).    Assessment:  Ryan Domangue is a 76 y/o M with COPD, tobacco use d/o, PAD presenting with SOB and penile lesions.    #Hypoxia and respiratory failure  #Aspergillus on resp cultures 12/6  #Vesciluar Penile lesions, HSV2+  #COPD Exacerbation   #Afib  #PAD  #HTN  #HLD  #GERD  #DM2    No clear evidence for an infectious cause for his respiratory decompensation post-procedure.  The chest x-ray is relatively unchanged for December 9th and he is afebrile without a leukocytosis. However given extent of disease seen on imaging, would favor empirically treating for now for common bacterial pathogens as well as aspergillus, pending finalization of recent BAL cultures. MRSA nares negative, and thus unlikely he has MRSA pneumonia.     Recommendations / Plan:  - d/c Zosyn  - start Ceftriaxone 1g q24hrs and Flagyl 500mg  TID  - continue Voriconazole(change dose to 200mg  BID) pending fungal cultures. If aspergillus does not grow on recent BAL, would likely d/c.   - Follow up BAL cultures  - Continue acyclovir 400 mg TID for genital herpes (end 12/16) Please page General ID pager [phone]) with any additional questions.  Discussed with attending Dr. Cato Mulligan.     Author:  Robyne Askew, MD 02/10/2017 4:26 PM

## 2017-02-12 ENCOUNTER — Ambulatory Visit: Payer: PRIVATE HEALTH INSURANCE

## 2017-02-12 LAB — Glucose,POC
GLUCOSE,POC: 153 mg/dL — ABNORMAL HIGH (ref 65–99)
GLUCOSE,POC: 183 mg/dL — ABNORMAL HIGH (ref 65–99)
GLUCOSE,POC: 90 mg/dL (ref 65–99)
GLUCOSE,POC: 140 mg/dL — ABNORMAL HIGH (ref 65–99)

## 2017-02-12 LAB — Aspergillus Ag: ASPERGILLUS ANTIGEN EIA: <0.5 (ref <0.50)

## 2017-02-12 LAB — Legionella Culture
LEGIONELLA CULTURE: NEGATIVE
LEGIONELLA CULTURE: NEGATIVE

## 2017-02-12 LAB — Differential Automated: LYMPHOCYTE PERCENT, AUTO: 25.1 (ref 0.20–0.80)

## 2017-02-12 LAB — CBC: NEUTROPHILS ABS (PRELIM): 2.64 10*3/uL (ref 0.00–0.00)

## 2017-02-12 LAB — Blood Gases,venous
BICARBONATE: 36 mmol/L
PO2,VENOUS: 25 mmHg

## 2017-02-12 LAB — Basic Metabolic Panel
ANION GAP: 9 mmol/L (ref 8–19)
SODIUM: 136 mmol/L (ref 135–146)

## 2017-02-12 LAB — Magnesium: MAGNESIUM: 1.5 meq/L (ref 1.4–1.9)

## 2017-02-12 MED ADMIN — IPRATROPIUM BROMIDE 0.02 % IN SOLN: 500 ug | RESPIRATORY_TRACT | @ 07:00:00 | Stop: 2017-02-19 | NDC 00487980101

## 2017-02-12 MED ADMIN — ALBUTEROL SULFATE (2.5 MG/3ML) 0.083% IN NEBU: 2.5 mg | RESPIRATORY_TRACT | @ 04:00:00 | Stop: 2017-02-14 | NDC 00487950101

## 2017-02-12 MED ADMIN — METRONIDAZOLE 250 MG PO TABS: 500 mg | ORAL | @ 21:00:00 | Stop: 2017-02-13 | NDC 00904145361

## 2017-02-12 MED ADMIN — ALBUTEROL SULFATE (2.5 MG/3ML) 0.083% IN NEBU: 2.5 mg | RESPIRATORY_TRACT | @ 12:00:00 | Stop: 2017-02-14 | NDC 00487950101

## 2017-02-12 MED ADMIN — GABAPENTIN 400 MG PO CAPS: 800 mg | ORAL | @ 05:00:00 | Stop: 2017-02-22 | NDC 68084077401

## 2017-02-12 MED ADMIN — APIXABAN 5 MG PO TABS: 5 mg | ORAL | @ 18:00:00 | Stop: 2017-02-13 | NDC 00003089431

## 2017-02-12 MED ADMIN — CARVEDILOL 12.5 MG PO TABS: 12.5 mg | ORAL | @ 06:00:00 | Stop: 2017-02-15 | NDC 68084086501

## 2017-02-12 MED ADMIN — INSULIN ASPART 100 UNIT/ML SC SOPN: 3 mL | SUBCUTANEOUS | @ 08:00:00 | Stop: 2017-02-22 | NDC 00169633910

## 2017-02-12 MED ADMIN — FUROSEMIDE 10 MG/ML IJ SOLN: 40 mg | INTRAVENOUS | @ 01:00:00 | Stop: 2017-02-13 | NDC 36000028325

## 2017-02-12 MED ADMIN — INSULIN ASPART 100 UNIT/ML SC SOPN: 3 mL | SUBCUTANEOUS | @ 02:00:00 | Stop: 2017-02-22 | NDC 00169633910

## 2017-02-12 MED ADMIN — IPRATROPIUM BROMIDE 0.02 % IN SOLN: 500 ug | RESPIRATORY_TRACT | @ 17:00:00 | Stop: 2017-02-19 | NDC 00487980101

## 2017-02-12 MED ADMIN — BUSPIRONE HCL 5 MG PO TABS: 15 mg | ORAL | @ 07:00:00 | Stop: 2017-02-21 | NDC 16729020001

## 2017-02-12 MED ADMIN — METRONIDAZOLE 250 MG PO TABS: 500 mg | ORAL | @ 18:00:00 | Stop: 2017-02-13 | NDC 00904145361

## 2017-02-12 MED ADMIN — GABAPENTIN 400 MG PO CAPS: 800 mg | ORAL | @ 14:00:00 | Stop: 2017-02-22 | NDC 68084077401

## 2017-02-12 MED ADMIN — HYDROCODONE-ACETAMINOPHEN 5-325 MG PO TABS: 2 | ORAL | @ 22:00:00 | Stop: 2017-02-22 | NDC 00406012362

## 2017-02-12 MED ADMIN — BUDESONIDE-FORMOTEROL FUMARATE 160-4.5 MCG/ACT IN AERO: 2 | RESPIRATORY_TRACT | @ 05:00:00 | Stop: 2017-02-22

## 2017-02-12 MED ADMIN — IPRATROPIUM BROMIDE 0.02 % IN SOLN: 500 ug | RESPIRATORY_TRACT | @ 01:00:00 | Stop: 2017-02-19 | NDC 00487980101

## 2017-02-12 MED ADMIN — ALBUTEROL SULFATE (2.5 MG/3ML) 0.083% IN NEBU: 2.5 mg | RESPIRATORY_TRACT | @ 07:00:00 | Stop: 2017-02-14 | NDC 00487950101

## 2017-02-12 MED ADMIN — IPRATROPIUM BROMIDE 0.02 % IN SOLN: 500 ug | RESPIRATORY_TRACT | @ 04:00:00 | Stop: 2017-02-19 | NDC 00487980101

## 2017-02-12 MED ADMIN — METRONIDAZOLE 250 MG PO TABS: 500 mg | ORAL | @ 05:00:00 | Stop: 2017-02-13 | NDC 00904145361

## 2017-02-12 MED ADMIN — ALBUTEROL SULFATE (2.5 MG/3ML) 0.083% IN NEBU: 2.5 mg | RESPIRATORY_TRACT | @ 17:00:00 | Stop: 2017-02-14 | NDC 00487950101

## 2017-02-12 MED ADMIN — PANTOPRAZOLE SODIUM 40 MG PO TBEC: 40 mg | ORAL | @ 18:00:00 | Stop: 2017-02-22 | NDC 68084081309

## 2017-02-12 MED ADMIN — PYRIDOXINE HCL 50 MG PO TABS: 100 mg | ORAL | @ 21:00:00 | Stop: 2017-02-22 | NDC 00536440801

## 2017-02-12 MED ADMIN — CARVEDILOL 12.5 MG PO TABS: 12.5 mg | ORAL | @ 18:00:00 | Stop: 2017-02-15 | NDC 68084086501

## 2017-02-12 MED ADMIN — PRAVASTATIN SODIUM 40 MG PO TABS: 40 mg | ORAL | @ 05:00:00 | Stop: 2017-02-22 | NDC 51079078220

## 2017-02-12 MED ADMIN — SENNOSIDES 8.6 MG PO TABS: 1 | ORAL | @ 06:00:00 | Stop: 2017-02-21 | NDC 00904652261

## 2017-02-12 MED ADMIN — FUROSEMIDE 10 MG/ML IJ SOLN: 40 mg | INTRAVENOUS | @ 18:00:00 | Stop: 2017-02-13 | NDC 36000028325

## 2017-02-12 MED ADMIN — ACYCLOVIR 400 MG PO TABS: 400 mg | ORAL | @ 06:00:00 | Stop: 2017-02-12 | NDC 00904579061

## 2017-02-12 MED ADMIN — HYDROCODONE-ACETAMINOPHEN 5-325 MG PO TABS: 2 | ORAL | @ 12:00:00 | Stop: 2017-02-22 | NDC 00406012362

## 2017-02-12 MED ADMIN — IPRATROPIUM BROMIDE 0.02 % IN SOLN: 500 ug | RESPIRATORY_TRACT | @ 12:00:00 | Stop: 2017-02-19 | NDC 00487980101

## 2017-02-12 MED ADMIN — CEFTRIAXONE 1 GM/50 ML RTU: 1 g | INTRAVENOUS | @ 04:00:00 | Stop: 2017-02-13 | NDC 00338500241

## 2017-02-12 MED ADMIN — BUDESONIDE-FORMOTEROL FUMARATE 160-4.5 MCG/ACT IN AERO: 2 | RESPIRATORY_TRACT | @ 17:00:00 | Stop: 2017-02-22 | NDC 00186037028

## 2017-02-12 MED ADMIN — INSULIN ASPART 100 UNIT/ML SC SOPN: 3 mL | SUBCUTANEOUS | @ 21:00:00 | Stop: 2017-02-22 | NDC 00169633910

## 2017-02-12 MED ADMIN — IPRATROPIUM BROMIDE 0.02 % IN SOLN: 500 ug | RESPIRATORY_TRACT | @ 19:00:00 | Stop: 2017-02-19 | NDC 00487980101

## 2017-02-12 MED ADMIN — CLOPIDOGREL BISULFATE 75 MG PO TABS: 75 mg | ORAL | @ 18:00:00 | Stop: 2017-02-13 | NDC 00904629461

## 2017-02-12 MED ADMIN — MAGNESIUM SULFATE 4 GM/100ML IV SOLN: 4 g | INTRAVENOUS | @ 23:00:00 | Stop: 2017-02-13 | NDC 63323010601

## 2017-02-12 MED ADMIN — DULOXETINE HCL 60 MG PO CPEP: 60 mg | ORAL | @ 18:00:00 | Stop: 2017-02-22 | NDC 00904645461

## 2017-02-12 MED ADMIN — ALBUTEROL SULFATE (2.5 MG/3ML) 0.083% IN NEBU: 2.5 mg | RESPIRATORY_TRACT | @ 01:00:00 | Stop: 2017-02-14 | NDC 00487950101

## 2017-02-12 MED ADMIN — DOCUSATE SODIUM 100 MG PO CAPS: 100 mg | ORAL | @ 05:00:00 | Stop: 2017-02-22 | NDC 00904645561

## 2017-02-12 MED ADMIN — LOSARTAN POTASSIUM 50 MG PO TABS: 100 mg | ORAL | @ 18:00:00 | Stop: 2017-02-22 | NDC 68084034701

## 2017-02-12 MED ADMIN — APIXABAN 5 MG PO TABS: 5 mg | ORAL | @ 06:00:00 | Stop: 2017-02-13 | NDC 00003089431

## 2017-02-12 MED ADMIN — ALBUTEROL SULFATE (2.5 MG/3ML) 0.083% IN NEBU: 2.5 mg | RESPIRATORY_TRACT | @ 19:00:00 | Stop: 2017-02-14 | NDC 00487950101

## 2017-02-12 MED ADMIN — HYDROCODONE-ACETAMINOPHEN 5-325 MG PO TABS: 2 | ORAL | @ 18:00:00 | Stop: 2017-02-22 | NDC 00406012362

## 2017-02-12 MED ADMIN — POLYETHYLENE GLYCOL 3350 17 G PO PACK: 17 g | ORAL | @ 18:00:00 | Stop: 2017-02-22 | NDC 68084043098

## 2017-02-12 MED ADMIN — BUSPIRONE HCL 5 MG PO TABS: 15 mg | ORAL | @ 18:00:00 | Stop: 2017-02-21 | NDC 16729020001

## 2017-02-12 MED ADMIN — DOCUSATE SODIUM 100 MG PO CAPS: 100 mg | ORAL | @ 21:00:00 | Stop: 2017-02-22 | NDC 00904645561

## 2017-02-12 MED ADMIN — HYDROCODONE-ACETAMINOPHEN 5-325 MG PO TABS: 2 | ORAL | @ 03:00:00 | Stop: 2017-02-22 | NDC 00406012362

## 2017-02-12 MED ADMIN — GABAPENTIN 400 MG PO CAPS: 800 mg | ORAL | @ 21:00:00 | Stop: 2017-02-22 | NDC 68084077401

## 2017-02-12 NOTE — Other
Patients Clinical Goal:   Clinical Goal(s) for the Shift: Afebrile, SPO2>88%, BS <140  Identify possible barriers to advancing the care plan:   Stability of the patient: Moderately Stable - low risk of patient condition declining or worsening   End of Shift Summary: Pt AAOX4. Afebrile with a Tmax of 37. Was placed on Bipap for a few hours. After bipap patient was placed on High flo 40%FIO2 and 40L, patient tolerating well, sating > 90%. . IV abx given as ordered. Had a bedtime BS of 183, insulin given as ordered per sliding scale. VBG collected and sent. PT and OT evaluation pending. [place] of lower left extremity still pending. Call bell within reach, will continue to monitor.

## 2017-02-12 NOTE — Progress Notes
NUTRITION IN-DEPTH SCREEN (Adult)    Admit Date: 02/01/2017     Date of Birth: April 22, 1940 Gender: male MRN: [id number]     Date of Screening 02/12/2017   Subjective: Nutrition f/u; Excellent PO intake. No issues.   Problems: Active Problems:    Lower back pain POA: Yes    OA (osteoarthritis) of knee POA: Yes    Primary osteoarthritis of knees, bilateral POA: Yes    COPD exacerbation (HCC/RAF) POA: Yes    COPD (chronic obstructive pulmonary disease) (HCC/RAF) POA: Yes    Chronic prescription opiate use POA: Not Applicable    Diabetes (HCC/RAF) POA: Yes    PAD (peripheral artery disease) (HCC/RAF) POA: Yes    Hypertension POA: Yes    Hypercholesteremia POA: Yes    GERD (gastroesophageal reflux disease) POA: Yes    Pneumonia POA: Unknown    Lower extremity edema POA: Unknown    Memory loss POA: Unknown    Genital herpes POA: Clinically Undetermined       Past Medical History:   Diagnosis Date   ??? Cancer (HCC/RAF)    ??? Diabetes (HCC/RAF)    ??? GERD (gastroesophageal reflux disease)    ??? Hypercholesteremia    ??? Hypertension    ??? PAD (peripheral artery disease) (HCC/RAF)    ??? 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 Prostate disease    ??? Scoliosis     adolesent   ??? Vascular disease     Past Surgical History:   Procedure Laterality Date   ??? BACK SURGERY     ??? BILATERAL FEMORAL ARTERY EXPLORATION  2009   ??? BLADDER SURGERY  1999   ??? PROSTATE SURGERY  2000         Anthropometrics     Admit  Height: 185.4 cm (6' 0.99'') (Stated)  Weight: (!) 103.4 kg (228 lb) Most Recent  Height: 185.4 cm (6' 0.99'')  Weight: (!) 102.9 kg (226 lb 13.7 oz)  IBW: 81 kg (178 lb 9.2 oz)  Usual Weight: 113.4 kg (250 lb)           BMI (Calculated): 30    % Ideal Body Weight: 128 %  % Usual Weight: 92 %        Allergies   Patient has no known allergies.     Cultural / Religious / Ethnic Food Preferences   None       Nutrition Prior to Admission   Per pt, follows a low carb diet at home.     Nutrition Risk Factors Moderate Nutrition Risk Factors:  (COPD exacerbation )     Acuity Level: 2-Moderate risk     Diet Orders     Diets/Supplements/Feeds   Diet    Diet carbohydrate controlled     Start Date/Time: 02/09/17 1330     Number of Occurrences:  Until Specified        Impression   PO % consumed: 76 to 100%  Impression: Diet tolerated well with good intake. Pt is obese-class 1, BMI 30.     Diet Education   Pt declines, Pt previously educated      FDI Target Drugs: No          Nutrition Care Plan   Plan: Continue with diet as ordered, Monitor adequacy of intake, Trend weights  Comments: Continue to follow.    Next Follow-up by 02/19/17    Author:  Bernette Mayers, DTR, pager [phone]  02/12/2017 12:59 PM

## 2017-02-12 NOTE — Progress Notes
MEDICINE ACCEPTANCE NOTE       DATE OF ADMISSION: 02/01/17  TRANSFER TO ICU: 02/08/17  TRANSFER TO MEDICINE: 02/11/2017   HOSPITAL DAY: 10     CHIEF COMPLAINT:   Chief Complaint   Patient presents with   ??? Shortness of Breath     dx with pneumonia at OSH on antibiotics with clear withe phelgm Denies fever.      Hospital Course   Per MICU transfer note:   ''Ryan Shepard???is a 76 y.o.???male???with history of COPD (on baseline 3L), grade I diastolic dysfunction, atrial fibrillation (on apixaban),???HTN, DM2, PAD s/p femoral graft, GERD admitted to [facility] ???12/6 for dyspnea with LUL consolidation; transferred to MICU 12/13 for worsening mixed hypoxemic/hypercapneic respiratory failure, now returning to Medicine for continued management.  ???  Patient presented to [facility] ED 12/6 with one week dyspnea; initial exam notable for labored breathing, wheezing, lower extremity edema; CXR notable for LUL consolidation. Treated multifactorially for CAP (levofloxacin 12/6-12), COPD exacerbation (prednisone 12/6-10), volume overload (daily lasix, total net -5L). Admission sputum cultures subsequently returned positive for aspergillus, with CT chest demonstrating biapical L>R consolidations with bulky intrathoracic lymphadenopathy. ID and Pulmonology consulted for further workup, and bronchoscopy with BAL completed 12/13. Patient following biopsy developed increasing oxygen requirements with hypercapnia (50 -> 70) and relative hypotension prompting MICU transfer.  ???  Patient upon transfer was placed on HFNC 40L/40% with improvement in work of breathing. Treated empirically with vanc/zosyn/voriconazole (12/13-14) as well as scheduled nebulizers and gentle diuresis. Maintained stable O2 requirement and well-compensated hypercapnia over 48h monitoring. Given clinical stability and negative cultures, antibiotics narrowed to ceftriaxone/flagyl (12/15); voriconazole discontinued 12/16 per ID recommendations. Patient moved to floor 12/15 on HFNC 35L/40%, transferred to Medicine 12/16 for continued O2 weaning and IV antibiotics for suspected bacterial pneumonia as well as consideration of further workup for underlying lung pathology (fungal vs malignant).''    Last 24 hours/overnight events    - patient on HFNC, oxygen saturation is adequate   - latest VBG continues to show hypercpania  - patient alert and oriented, no confusion, appears comfortable  - states SOB is improving but cough is ongoing with brown tinged sputum     14-point ROS negative except as outlined above.    Medications     Scheduled:    acyclovir 400 mg Oral TID   albuterol 2.5 mg Nebulization Q4H   apixaban 5 mg Oral BID   budesonide-formoterol 2 puff Inhalation BID   busPIRone 15 mg Oral BID   carvedilol 12.5 mg Oral BID   cefTRIAXone 1 g Intravenous Q24H   clopidogrel 75 mg Oral Daily   docusate 100 mg Oral BID   DULoxetine 60 mg Oral Daily   furosemide 40 mg Intravenous Daily   gabapentin 800 mg Oral TID   ipratropium 0.5 mg Nebulization Q4H   metroNIDAZOLE 500 mg Oral TID   pantoprazole 40 mg Oral Daily   polyethylene glycol 17 g Oral Daily   pravastatin 40 mg Oral QHS   pyridoxine 100 mg Oral Daily   senna 1 tablet Oral QHS      Continuous:  ??? sodium chloride        PRN:  insulin aspart **AND** dextrose, diphenhydrAMINE, HYDROcodone-acetaminophen, LORazepam      Physical Exam   Temp:  [36 ???C (96.8 ???F)-37.1 ???C (98.8 ???F)] 36.3 ???C (97.3 ???F)  Heart Rate:  [76-93] 78  Resp:  [10-24] 19  BP: (107-165)/(58-88) 165/68  NBP Mean:  [74-96] 96  FiO2 (%):  [  40 %] 40 %  SpO2:  [89 %-100 %] 95 %   UOP: Urine:  [425 mL-975 mL] 975 mL I/Os:   Intake/Output Summary (Last 24 hours) at 02/11/17 1642  Last data filed at 02/11/17 1610   Gross per 24 hour   Intake            452.5 ml   Output             2500 ml   Net          -2047.5 ml     Gen: 76 yo [name], pleasant, in no acute distress, appears comfortable, sitting up in chair, HFNC in place Eyes: PERRL, EOMI  ENT: OP clear, MMM  Neck: Supple, trachea midline, no cervical or supraclavicular lymphadenopathy  CV: poor air movement, crackles bilaterally, intermittent expiratory wheezing   Pulm: CTAB, no wheezes/rales/rhonchi, normal work of breathing  Abd: NABS, soft, flat, NT, no masses or organomegaly  Skin: Warm and dry, no rashes  Ext: no lower extremity pitting edema, swelling around L ankle   Neuro: A&Ox4, CNII-XII intact, motor 5/5 throughout, sensation grossly intact    Laboratory Data   CBC:  Recent Labs      02/09/17   0559  02/08/17   1855   WBC  6.92  8.58   NEUTABS  5.02  6.25   HGB  13.1*  13.3*   HCT  40.9  40.8   MCV  88.9  90.1   PLT  109*  118*     BMP:  Recent Labs      02/11/17   0539  02/10/17   0345  02/09/17   0309  02/08/17   1855   NA  141  137  138  137   K  4.1  4.9  4.4  4.8   CL  98  98  98  96   CO2  31*  29  29  31*   BUN  17  21  21   27*   CREAT  0.96  0.96  1.00  1.15   GLUCOSE  109*  158*  98  111*   CALCIUM  8.8  8.5*  8.7  8.9   MG  1.5  1.7  1.8  2.1*   PHOS   --    --    --   3.4     LFT:  Recent Labs      02/08/17   1855   TOTPRO  5.8*   ALBUMIN  2.8*   BILITOT  0.4   BILICON  <0.2   ALT  18   AST  20   ALKPHOS  50     Microbiology   12/13 BAL:    AFB NTD    Bact NTD    Fungal: Candida    GS: negative    Biofire: negative  Aspergillus Fumigatus 02/05/17: <0.10  Cryptococcal Ag blood 02/06/17: negative  Bacterial Culture Respiratory 02/01/17: Few Probable candida albicans , Aspergillus [place]???   MRSA nares 02/01/17: No Methicillin-resistant Staphylococcus aureus isolated.   Sputum screen 02/01/17: <10 Epithelial cells per low power field   Resp bacterial Cx 02/01/17: <10 Epithelial cells per low power field   Legionella urine 02/01/17: negative     Imaging   CXR 02/10/17  Severe bilateral airspace consolidation greatest in the left perihilar region and bilateral lower lobes with diffuse increase in interstitial markings, likely a combination of multifocal pneumonia and edema.Moderate left and small  right pleural effusions.Increased soft tissue density in the right paratracheal region, consistent with adenopathy.Normal heart size.No significant osseous abnormalities.    Assessment/Plan   Ryan Shepard???is a 76 y.o.???male???with history of COPD (on baseline 3L), grade I diastolic dysfunction, atrial fibrillation (on apixaban),???HTN, DM2, PAD s/p femoral graft, GERD admitted to [facility] ???12/6 for dyspnea with LUL consolidation; transferred to MICU 12/13 for worsening mixed hypoxemic/hypercapneic respiratory failure.  ???  #asymetric leg swelling (L)  Likely 2/2 past L ankle injury, also suspicion for DVT given hospitalization and immobility   - ordered US LE to r/o DVT  - patient already on apixaban and clopidogrel     #Mixed hypoxemic/hypercapneic respiratory failure (improving)    Acute hypoxemia with increasing O2 requirement and hypercapnia following bronchoscopy. Highest concern for post-procedural inflammation/bronchoconstriction, however differential also includes acute bacterial pneumonia including aspiration in setting of sedation. Initial concern for  pulmonary aspergillosis given positive sputum cultures on admission, however BAL cultures remain negative (+candida only, likely colonizer) with negative galactomanan less suggestive of pathogenic infection. Fungal infection and malignancy remain on differential for chronic respiratory symptoms with prominent LUL opacity and lymphadenopathy.  - Patient alert and oriented, no confusion, poor air movement, crackles and wheezing cough with brown tinged sputum, VBG stable but continues to hypercapnic compared on VBG on admission   - currently on HFNC 35L/40% for goal FiO2 >88%; wean to NC as tolerated (goal is to discharge on home 3L NC)   -  VBG for monitoring of ventilatory status  - s/p vanc (12/14), zosyn (12/14-12/15), s/p voriconazole (12/14-16); held per ID recs given BAL cultures NTD  - continue ctx/metronidazole (12/15-20) for asp PNA, plan for 7d course  - scheduled albuterol/ipratropium nebs???q4h  - budesonide-formoterol 160-4.5 mcg 2 puffs BID   - re-consult pulmonary, consider prednisone course for COPD exacerbation   - Pulmonology consulted for further workup of lung mass/LAD, likely as outpatient pending clinical course  - encouraged incentive spirometry multiple times daily   - start BIPAP at night   ??????  #HFpEF  Grade I diastolic dysfunction per 01/2017 TTE, EF 70%. Evidence of pulmonary edema on CXR following MICU transfer. Appears on more euvolemic on exam today.   - continue diuresis   - IV lasix 40mg  daily   - goal net -500cc to -1L  - daily weights  - strict ins/outs   ???  #Afib  Rate-controlled, anticoagulated.   - on continuous cardiac monitoring   - carvedilol 25 mg???BID (restarted 12/16 by MICU  - apixaban 5 mg BID ??????  ???  #HTN  relatively hypotensive on transfer, though without signs of end-organ hypoperfusion, MAP maintained >70. Losartan and carvedilol held in MICU for hypotensive.   - restart losartan 100 mg daily in AM   ???  #DM2   Blood glucose at goal   - insulin sliding scale algorithm #3  - gabapentin 800 mg ???TID   - consider restarting home oral metformin upon discharge   ???  #PAD  s/p femoral-femoral graft 2009.  - clopidogrel 75 mg daily   ???  #HLD chronic/stable  - pravastatin 40 mg nightly   ???  #genital HSV  Throughout admission patient was noted to have active flare. 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  #GERD chronic/stable  - pantoprazole 40 mg daily   ???  #depression/ anxiety  Mood stable, no SI, no anxiety  - buspirone 15 mg BID  - duloxetine DR  60   - lorazepam 2 mg PRN BID  ???  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  Inpatient Checklist  Diet: Diet 2 gram sodium Diet carbohydrate controlled  DVT Prophylaxis: apixaban  GI Prophylaxis: PPI  Central Lines: None   Tubes/Drains: None     Code Status:   Full Code    Contact:  Primary Emergency Contact: Tomaszewski,Johnnie, Home Phone: [phone]    DWA Dr. Jackelyn Hoehn     Author:  Bascom Levels, MD  [phone]  02/11/2017 4:42 PM    Attending Addendum:  Date of Service: 02/11/2017    I saw and evaluated Ryan Shepard. I discussed the case with the resident and agree with the findings and plan of care as documented in the resident's note above. I personally reviewed the interval physician notes, nursing notes, and allied health professional notes, imaging, labs and microbiologic information over the last 24 hours.    Greater than 50% of a 35 minute encounter was spent on direct patient care activities, counseling of the patient and/or family, and coordination of care for the problems discussed in my note, as well as: discussion of care plan and management options, discussion with other treating/consulting physicians.    Shirell Struthers L. Jackelyn Hoehn, MD  02/11/2017 10:46 PM  Clinical Instructor, [facility]

## 2017-02-12 NOTE — Progress Notes
02/12/17 0947   Time Calculation   Start Time 0947  (PT eval attempt)   Patient not seen due to Other (Comment)  (pt being r/o for LE DVT. Pt already on coagulation. This Probation officer paged MD for clearance to participate in assessment, no responce to page. Will HOLD eval for now, follow-up tomorrow)

## 2017-02-12 NOTE — Other
Patients Clinical Goal:   Clinical Goal(s) for the Shift: Admit to unit, pain control, SPO2>88%, Afebrile  Identify possible barriers to advancing the care plan: None  Stability of the patient: Moderately Stable - low risk of patient condition declining or worsening   End of Shift Summary: AxOx4, VSS, monitored. Pt on HiFlow 40% FiO2, 35L. VBG test decreased from q6 to qAM. Pt sat in chair for 5 hours today. PT/OT pending. Ulx duplex LE pending. Lasix x1 given. Pt to be on  Bipap tonight. All needs and concerns addressed, will cont to monitor, family at bedside.

## 2017-02-12 NOTE — Consults
Per IDR re: Patient with acute respiratory failure. Not stable for discharge however family amenable to SNF placement. PT eval pending.    SNF referral sent via [facility].    Will continue to follow.    Michaela Corner, RN, MSN  Clinical Case Manager - Coverage for Medicine Team D  [phone]

## 2017-02-13 ENCOUNTER — Ambulatory Visit: Payer: MEDICARE

## 2017-02-13 LAB — Basic Metabolic Panel
CHLORIDE: 96 mmol/L (ref 96–106)
TOTAL CO2: 34 mmol/L — ABNORMAL HIGH (ref 20–30)

## 2017-02-13 LAB — Legionella Culture
LEGIONELLA CULTURE: NEGATIVE
LEGIONELLA CULTURE: NEGATIVE

## 2017-02-13 LAB — Magnesium: MAGNESIUM: 1.9 meq/L (ref 1.4–1.9)

## 2017-02-13 LAB — CBC: RED CELL DISTRIBUTION WIDTH-CV: 14.2 (ref 11.1–15.5)

## 2017-02-13 LAB — Glucose,POC
GLUCOSE,POC: 146 mg/dL — ABNORMAL HIGH (ref 65–99)
GLUCOSE,POC: 187 mg/dL — ABNORMAL HIGH (ref 65–99)
GLUCOSE,POC: 111 mg/dL — ABNORMAL HIGH (ref 65–99)
GLUCOSE,POC: 152 mg/dL — ABNORMAL HIGH (ref 65–99)

## 2017-02-13 LAB — Troponin I: TROPONIN I: <0.04 ng/mL (ref <0.1)

## 2017-02-13 LAB — Differential Automated: MONOCYTE PERCENT, AUTO: 15.4 (ref 0.00–0.50)

## 2017-02-13 LAB — APTT
APTT: 39.1 s — ABNORMAL HIGH (ref 24.4–36.2)
APTT: 135.9 s — ABNORMAL HIGH (ref 24.4–36.2)

## 2017-02-13 LAB — Blood Gases,venous: BASE EXCESS: 6 mmol/L

## 2017-02-13 LAB — Nocardia Culture: NOCARDIA CULTURE: NEGATIVE

## 2017-02-13 LAB — Cytology, Respiratory

## 2017-02-13 MED ADMIN — SODIUM CHLORIDE 0.9 % IV SOLN: 5 mL/h | INTRAVENOUS | @ 20:00:00 | Stop: 2017-02-22 | NDC 00338961212

## 2017-02-13 MED ADMIN — ALBUTEROL SULFATE (2.5 MG/3ML) 0.083% IN NEBU: 2.5 mg | RESPIRATORY_TRACT | @ 08:00:00 | Stop: 2017-02-14 | NDC 00487950101

## 2017-02-13 MED ADMIN — CEFTRIAXONE 1 GM/50 ML RTU: 1 g | INTRAVENOUS | @ 04:00:00 | Stop: 2017-02-13 | NDC 00338500241

## 2017-02-13 MED ADMIN — HYDROMORPHONE HCL 2 MG PO TABS: 1 mg | ORAL | @ 09:00:00 | Stop: 2017-02-13 | NDC 42858030125

## 2017-02-13 MED ADMIN — IPRATROPIUM BROMIDE 0.02 % IN SOLN: 500 ug | RESPIRATORY_TRACT | @ 12:00:00 | Stop: 2017-02-19 | NDC 00487980101

## 2017-02-13 MED ADMIN — IPRATROPIUM BROMIDE 0.02 % IN SOLN: 500 ug | RESPIRATORY_TRACT | @ 02:00:00 | Stop: 2017-02-19 | NDC 00487980101

## 2017-02-13 MED ADMIN — GABAPENTIN 400 MG PO CAPS: 800 mg | ORAL | @ 19:00:00 | Stop: 2017-02-22 | NDC 68084077401

## 2017-02-13 MED ADMIN — ALBUTEROL SULFATE (2.5 MG/3ML) 0.083% IN NEBU: 2.5 mg | RESPIRATORY_TRACT | @ 16:00:00 | Stop: 2017-02-14 | NDC 00487950101

## 2017-02-13 MED ADMIN — DOCUSATE SODIUM 100 MG PO CAPS: 100 mg | ORAL | @ 06:00:00 | Stop: 2017-02-22 | NDC 00904645561

## 2017-02-13 MED ADMIN — SENNOSIDES 8.6 MG PO TABS: 1 | ORAL | @ 06:00:00 | Stop: 2017-02-21 | NDC 00904652261

## 2017-02-13 MED ADMIN — BUSPIRONE HCL 5 MG PO TABS: 15 mg | ORAL | @ 17:00:00 | Stop: 2017-02-21 | NDC 16729020001

## 2017-02-13 MED ADMIN — METHYLPREDNISOLONE SODIUM SUCC 40 MG IJ SOLR: 40 mg | INTRAVENOUS | @ 19:00:00 | Stop: 2017-02-18 | NDC 00009003928

## 2017-02-13 MED ADMIN — HEPARIN 25,000 UNITS/250 ML 0.45% NACL RTU: 1400 [IU]/h | INTRAVENOUS | @ 06:00:00 | Stop: 2017-02-16 | NDC 00409765062

## 2017-02-13 MED ADMIN — ALBUTEROL SULFATE (2.5 MG/3ML) 0.083% IN NEBU: 2.5 mg | RESPIRATORY_TRACT | @ 05:00:00 | Stop: 2017-02-14 | NDC 00487950101

## 2017-02-13 MED ADMIN — IPRATROPIUM BROMIDE 0.02 % IN SOLN: 500 ug | RESPIRATORY_TRACT | @ 19:00:00 | Stop: 2017-02-19 | NDC 00487980101

## 2017-02-13 MED ADMIN — BUDESONIDE-FORMOTEROL FUMARATE 160-4.5 MCG/ACT IN AERO: 2 | RESPIRATORY_TRACT | @ 16:00:00 | Stop: 2017-02-22 | NDC 00186037028

## 2017-02-13 MED ADMIN — IPRATROPIUM BROMIDE 0.02 % IN SOLN: 500 ug | RESPIRATORY_TRACT | @ 08:00:00 | Stop: 2017-02-19 | NDC 00487980101

## 2017-02-13 MED ADMIN — IPRATROPIUM BROMIDE 0.02 % IN SOLN: 500 ug | RESPIRATORY_TRACT | @ 16:00:00 | Stop: 2017-02-19 | NDC 00487980101

## 2017-02-13 MED ADMIN — PRAVASTATIN SODIUM 40 MG PO TABS: 40 mg | ORAL | @ 06:00:00 | Stop: 2017-02-22 | NDC 51079078220

## 2017-02-13 MED ADMIN — DOCUSATE SODIUM 100 MG PO CAPS: 100 mg | ORAL | @ 17:00:00 | Stop: 2017-02-22 | NDC 00904645561

## 2017-02-13 MED ADMIN — PIPERACILLIN-TAZOBACTAM 3.375 G/50 ML RTU: 3.375 g | INTRAVENOUS | @ 20:00:00 | Stop: 2017-02-13 | NDC 00206886102

## 2017-02-13 MED ADMIN — LOSARTAN POTASSIUM 50 MG PO TABS: 100 mg | ORAL | @ 17:00:00 | Stop: 2017-02-22 | NDC 68084034701

## 2017-02-13 MED ADMIN — HEPARIN SODIUM (PORCINE) 1000 UNIT/ML IJ SOLN: 7250 [IU] | INTRAVENOUS | @ 06:00:00 | Stop: 2017-02-13 | NDC 63323054011

## 2017-02-13 MED ADMIN — INSULIN ASPART 100 UNIT/ML SC SOPN: 3 mL | SUBCUTANEOUS | @ 18:00:00 | Stop: 2017-02-22 | NDC 00169633910

## 2017-02-13 MED ADMIN — IPRATROPIUM BROMIDE 0.02 % IN SOLN: 500 ug | RESPIRATORY_TRACT | @ 05:00:00 | Stop: 2017-02-19 | NDC 00487980101

## 2017-02-13 MED ADMIN — PIPERACILLIN-TAZOBACTAM 3.375 GM/50 ML RTU (EXT 4HR): 3.375 g | INTRAVENOUS | @ 22:00:00 | Stop: 2017-02-18 | NDC 00206886102

## 2017-02-13 MED ADMIN — POLYETHYLENE GLYCOL 3350 17 G PO PACK: 17 g | ORAL | @ 17:00:00 | Stop: 2017-02-22 | NDC 68084043098

## 2017-02-13 MED ADMIN — ALBUTEROL SULFATE (2.5 MG/3ML) 0.083% IN NEBU: 2.5 mg | RESPIRATORY_TRACT | @ 19:00:00 | Stop: 2017-02-14 | NDC 00487950101

## 2017-02-13 MED ADMIN — PANTOPRAZOLE SODIUM 40 MG PO TBEC: 40 mg | ORAL | @ 17:00:00 | Stop: 2017-02-22 | NDC 68084081309

## 2017-02-13 MED ADMIN — VANCOMYCIN 250 ML IVPB: 1.25 g | INTRAVENOUS | @ 21:00:00 | Stop: 2017-02-15 | NDC 47781059791

## 2017-02-13 MED ADMIN — HYDROCODONE-ACETAMINOPHEN 5-325 MG PO TABS: 2 | ORAL | @ 17:00:00 | Stop: 2017-02-22 | NDC 00406012362

## 2017-02-13 MED ADMIN — DULOXETINE HCL 60 MG PO CPEP: 60 mg | ORAL | @ 17:00:00 | Stop: 2017-02-22 | NDC 00904645461

## 2017-02-13 MED ADMIN — GABAPENTIN 400 MG PO CAPS: 800 mg | ORAL | @ 06:00:00 | Stop: 2017-02-22 | NDC 68084077401

## 2017-02-13 MED ADMIN — PYRIDOXINE HCL 50 MG PO TABS: 100 mg | ORAL | @ 17:00:00 | Stop: 2017-02-22 | NDC 00536440801

## 2017-02-13 MED ADMIN — INSULIN ASPART 100 UNIT/ML SC SOPN: 3 mL | SUBCUTANEOUS | @ 21:00:00 | Stop: 2017-02-22 | NDC 00169633910

## 2017-02-13 MED ADMIN — ALBUTEROL SULFATE (2.5 MG/3ML) 0.083% IN NEBU: 2.5 mg | RESPIRATORY_TRACT | @ 02:00:00 | Stop: 2017-02-14 | NDC 00487950101

## 2017-02-13 MED ADMIN — BUSPIRONE HCL 5 MG PO TABS: 15 mg | ORAL | @ 06:00:00 | Stop: 2017-02-21 | NDC 16729020001

## 2017-02-13 MED ADMIN — CARVEDILOL 12.5 MG PO TABS: 12.5 mg | ORAL | @ 06:00:00 | Stop: 2017-02-15 | NDC 68084086501

## 2017-02-13 MED ADMIN — HEPARIN 25,000 UNITS/250 ML 0.45% NACL RTU: 1400 [IU]/h | INTRAVENOUS | @ 17:00:00 | Stop: 2017-02-16

## 2017-02-13 MED ADMIN — GABAPENTIN 400 MG PO CAPS: 800 mg | ORAL | @ 14:00:00 | Stop: 2017-02-22 | NDC 68084077401

## 2017-02-13 MED ADMIN — INSULIN ASPART 100 UNIT/ML SC SOPN: 3 mL | SUBCUTANEOUS | @ 07:00:00 | Stop: 2017-02-22 | NDC 00169633910

## 2017-02-13 MED ADMIN — HYDROCODONE-ACETAMINOPHEN 5-325 MG PO TABS: 2 | ORAL | @ 03:00:00 | Stop: 2017-02-22 | NDC 00406012362

## 2017-02-13 MED ADMIN — METRONIDAZOLE 250 MG PO TABS: 500 mg | ORAL | @ 17:00:00 | Stop: 2017-02-13 | NDC 00904145361

## 2017-02-13 MED ADMIN — BUDESONIDE-FORMOTEROL FUMARATE 160-4.5 MCG/ACT IN AERO: 2 | RESPIRATORY_TRACT | @ 05:00:00 | Stop: 2017-02-22 | NDC 00186037028

## 2017-02-13 MED ADMIN — FUROSEMIDE 10 MG/ML IJ SOLN: 40 mg | INTRAVENOUS | @ 17:00:00 | Stop: 2017-02-13 | NDC 36000028325

## 2017-02-13 MED ADMIN — HYDRALAZINE HCL 25 MG PO TABS: 25 mg | ORAL | @ 20:00:00 | Stop: 2017-02-14 | NDC 00904644161

## 2017-02-13 MED ADMIN — CARVEDILOL 12.5 MG PO TABS: 12.5 mg | ORAL | @ 17:00:00 | Stop: 2017-02-15 | NDC 68084086501

## 2017-02-13 MED ADMIN — INSULIN ASPART 100 UNIT/ML SC SOPN: 3 mL | SUBCUTANEOUS | @ 03:00:00 | Stop: 2017-02-22 | NDC 00169633910

## 2017-02-13 MED ADMIN — ALBUTEROL SULFATE (2.5 MG/3ML) 0.083% IN NEBU: 2.5 mg | RESPIRATORY_TRACT | @ 12:00:00 | Stop: 2017-02-14 | NDC 00487950101

## 2017-02-13 MED ADMIN — METRONIDAZOLE 250 MG PO TABS: 500 mg | ORAL | @ 06:00:00 | Stop: 2017-02-13 | NDC 00904145361

## 2017-02-13 NOTE — Progress Notes
02/13/17 0926   Time Calculation   Start Time [id number]   Patient not seen due to Medically not appropriate;Team/RN requesting to hold treatment  (Pt c/o chest pain-now resolved. Awaiting chest xray and troponin draw. Will check back in PM if able.RN aware)   Chart accessed for Treatment scheduling or assignment

## 2017-02-13 NOTE — Progress Notes
02/13/17 0808   Time Calculation   Start Time 0808  (2nd PT eval attempt)   Patient not seen due to Medically not appropriate  (pt c/o chest pain this morning. A troponin has been ordered. Will HOLD eval, await results of lab)

## 2017-02-13 NOTE — Consults
Physical Therapy Evaluation      PATIENT: Ryan Shepard  MRN: [id number]  DOB: [birth year]    ADMIT DATE: 02/01/2017       Date of Evaluation: 02/13/2017    Problems: Active Problems:    Lower back pain POA: Yes    OA (osteoarthritis) of knee POA: Yes    Primary osteoarthritis of knees, bilateral POA: Yes    COPD exacerbation (HCC/RAF) POA: Yes    COPD (chronic obstructive pulmonary disease) (HCC/RAF) POA: Yes    Chronic prescription opiate use POA: Not Applicable    Diabetes (HCC/RAF) POA: Yes    PAD (peripheral artery disease) (HCC/RAF) POA: Yes    Hypertension POA: Yes    Hypercholesteremia POA: Yes    GERD (gastroesophageal reflux disease) POA: Yes    Pneumonia POA: Unknown    Lower extremity edema POA: Unknown    Memory loss POA: Unknown    Genital herpes POA: Clinically Undetermined       Past Medical History:   Diagnosis Date   ??? Cancer (HCC/RAF)    ??? Diabetes (HCC/RAF)    ??? GERD (gastroesophageal reflux disease)    ??? Hypercholesteremia    ??? Hypertension    ??? PAD (peripheral artery disease) (HCC/RAF)    ??? 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 Prostate disease    ??? Scoliosis     adolesent   ??? Vascular disease     Past Surgical History:   Procedure Laterality Date   ??? BACK SURGERY     ??? BILATERAL FEMORAL ARTERY EXPLORATION  2009   ??? BLADDER SURGERY  1999   ??? PROSTATE SURGERY  2000        Relevant Hospital Course: h/o COPD, tobacco use d/o, PAD s/p femoral graft, HTN, HLD, afib, GERD, depression, anxiety, chronic pain who p/w intermittent SOB. Dx with COPD exacerbation VS PNA. Transferred to MICU for worsening status on 12/13, now back on floor(12/15). Was R/O for LE DVT. Pt c/o chest pain this morning, trops and EKG (-)     Patient Stated Goal: agreeable to get up with PT, wants to go home     Living Arrangements   Type of Home: House  Home Layout: Two level, Able to live on main level with bedroom/bathroom  # Stairs in home:  (NA-will stay downstairs)  Bathroom Shower/Tub: Tub Bathroom Equipment: Grab bars in Teacher, English as a foreign language: Front wheeled walker, Medical laboratory scientific officer, Other (Comment) (electric bed)    Prior Level of Function   Level of Independence: Independent, Community ambulation  Lives With: Spouse  Support Available: Family  # of hours available: 24/7 available from capable spouse  ADL Assistance: Independent  Homemaking Assistance: Independent  Vision: Within Functional Limits  Hearing: Within Education administrator: Drives Self    Precautions   Precautions: Museum/gallery exhibitions officer;Check Labs;Fall risk  Orthotic: None  Current Activity Order: Order implies OOB  Weight Bearing Status: Not Applicable      GENERAL EVALUATION   Patient Presentation: Up in chair;IV;Oxygen;Cardiac monitor;Pulse ox    Bed Mobility   Rolling: Supervised  Sit to Supine: Supervised    Functional Mobility   Sit to Stand: Stand by Assist;Assistive Device (Comment) (2X's from low recliner and 1X from [facility])  Ambulation: Contact Guard Assist;Stand by Assist (CGA progressing to SBA)  Ambulation Distance (Feet): 150 ft with a turn  Gait Pattern: Unsteady (mildly unsteady but able to self correct)  Assistive Device: Front wheeled walker  Balance   Sitting - Dynamic: Good;without UE support  Standing - Dynamic: Good;with UE support     UE Assessment         LE Assessment   RLE Assessment: Within Functional Limits                 LLE Assessment: Within Functional Limits                 Sensation   Sensation: Grossly intact    Cognition   Cognition: Within Defined Limits  Safety Awareness: Good awareness of safety precautions  Barriers to Learning: None    Neurological Evaluation (if indicated)   Neuro Deficits: No    Pain Assessment   Patient complains of pain: No         Patient Status   Activity Tolerance: Good  Oxygen Needs: Face mask;With ambulation  Flow (& FiO2): NRFM at 25 L(per RT recs)  (at rest: on high flow 40%/40L)  Response to Treatment: Tolerated treatment well;Vital signs stable Compliance with Precautions: Not Applicable  Call light in reach: Yes  Presentation post treatment: In bed;On oxygen;On cardiac monitor;Lines/drains intact    Interdisciplinary Communication   Interdisciplinary Communication: Nurse      ASSESSMENT   Inpatient Recommendation: PT evaluate & discharge;No acute care PT needs;AMB with nursing        PT Recommendations   Discharge Recommendation: No skilled PT needs upon discharge  Discharge concerns: None noted  Discharge Equipment Recommended: Owns recommended DME  Equipment ordered: Not applicable    AM-PAC   AM-PAC Basic Mobility t-Scale Score: 50.88  AM-PAC Basic Mobility CMS 'G Code' Modifier: CI    G-codes       Evaluation Completed by: Andy Gauss, PT,  02/13/2017

## 2017-02-13 NOTE — Progress Notes
Medicine Progress Note     PMD: Wilburn Mylar, MD  DATE OF SERVICE: 02/12/2017  HOSPITAL DAY: 11  CHIEF COMPLAINT: Shortness of Breath (dx with pneumonia at OSH on antibiotics with clear withe phelgm Denies fever. )    Last 24 Hour/Overnight Events/Subjective:   Desat to mid 80s on 40/40 with movement in bed, took a few minutes to recover. Asx.   Wore bipap 4 hrs overnight, no improvement in VBG    Medications:   Scheduled:    albuterol 2.5 mg Nebulization Q4H   apixaban 5 mg Oral BID   budesonide-formoterol 2 puff Inhalation BID   busPIRone 15 mg Oral BID   carvedilol 12.5 mg Oral BID   cefTRIAXone 1 g Intravenous Q24H   clopidogrel 75 mg Oral Daily   docusate 100 mg Oral BID   DULoxetine 60 mg Oral Daily   furosemide 40 mg Intravenous Daily   gabapentin 800 mg Oral TID   ipratropium 0.5 mg Nebulization Q4H   losartan 100 mg Oral Daily   metroNIDAZOLE 500 mg Oral TID   pantoprazole 40 mg Oral Daily   polyethylene glycol 17 g Oral Daily   pravastatin 40 mg Oral QHS   pyridoxine 100 mg Oral Daily   senna 1 tablet Oral QHS     PRN:  insulin aspart **AND** dextrose, diphenhydrAMINE, HYDROcodone-acetaminophen, LORazepam  Infusions:  ??? sodium chloride         Physical Exam:   Temp:  [36.8 ???C (98.2 ???F)-37.1 ???C (98.8 ???F)] 37.1 ???C (98.8 ???F)  Heart Rate:  [73-90] 74  Resp:  [12-17] 16  BP: (98-143)/(63-78) 98/63  NBP Mean:  [74-87] 74  FiO2 (%):  [40 %-50 %] 50 %  SpO2:  [90 %-91 %] 90 %  I/O: I/O last 2 completed shifts:  In: 1480 [P.O.:1320; Other:60; IV Piggyback:100]  Out: 2075 [Urine:2075]    Gen: NAD, AOx3  CV: RRR, no m/r/g  Pulm: very poor air movement, diffuse rhonchi and mild crackles but no wheezes. No increased WOB.   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  ???  #Mixed hypoxemic/hypercapneic respiratory failure (improving)    Acute hypoxemia with increasing O2 requirement and hypercapnia following bronchoscopy. Highest concern for post-procedural inflammation/bronchoconstriction, however differential also includes acute bacterial pneumonia including aspiration in setting of sedation. Initial concern for ???pulmonary aspergillosis given???positive sputum???cultures on admission, however BAL cultures remain negative (+candida only, likely colonizer) with negative galactomanan less suggestive of pathogenic infection. Fungal infection and malignancy remain on differential for chronic respiratory symptoms with???prominent LUL opacity and???lymphadenopathy.  - continue???ctx/metronidazole (12/15-20) for asp PNA, plan for 7d course but may extend if no improvement  - Pulm consulted, awaiting recs  - consider steroids for COPD exacerbation/airway inflammation post bronch  - diurese for euvolemia or -500   - ???bipap at night, daily VBG   - s/p vanc (12/14), zosyn (12/14-12/15), s/p voriconazole (12/14-16); held per ID recs given BAL cultures NTD  - scheduled albuterol/ipratropium nebs???q4h  - budesonide-formoterol 160-4.5 mcg 2 puffs BID   ??????  #HFpEF  Grade  I diastolic dysfunction per 01/2017 TTE, EF 70%. Evidence of pulmonary edema on CXR following MICU transfer. Appears on more euvolemic on exam today.   - continue diuresis   - goal net -500cc   - daily weights  - strict ins/outs   ???  #asymetric leg swelling (L)  Likely 2/2 past L ankle injury, also suspicion for DVT given hospitalization and immobility   - ordered US LE to r/o DVT - patient already on apixaban and clopidogrel     #Afib  Rate-controlled, anticoagulated.   - on continuous cardiac monitoring   - carvedilol 25 mg???BID (restarted 12/16 by MICU  - apixaban 5 mg BID ??????  ???  #HTN  relatively hypotensive on transfer, though without signs of end-organ hypoperfusion, MAP maintained >70. Losartan and carvedilol held in MICU for hypotensive.   - restart losartan 100 mg daily in AM   ???  #DM2   Blood glucose at goal   - insulin sliding scale algorithm #3  - gabapentin 800 mg ???TID   - consider restarting home oral metformin upon discharge   ???  #PAD  s/p femoral-femoral graft 2009.  - clopidogrel 75 mg daily   ???  #HLD chronic/stable  - pravastatin 40 mg nightly   ???  #genital HSV  Throughout admission patient was noted to have active flare. 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  #GERD chronic/stable  - pantoprazole 40 mg daily   ???  #depression/ anxiety  Mood stable, no SI, no anxiety  - buspirone 15 mg BID  - duloxetine DR 60   - lorazepam 2 mg PRN BID  ???  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  Inpatient Checklist  Diet: Diet 2 gram sodium  Diet carbohydrate controlled  DVT Prophylaxis: apixaban  GI Prophylaxis: PPI  Central Lines: None???  Tubes/Drains: None???  ???  Code Status:   Full Code  ???  Contact:  Primary Emergency Contact: Barfoot,Johnnie, Home Phone: [phone]  ???  DWA Dr. Jackelyn Hoehn   ???  Author  Irving Burton A. Encarnacion Slates, MD  02/12/2017 at 7:38 PM

## 2017-02-13 NOTE — Other
Patients Clinical Goal:   Clinical Goal(s) for the Shift: Afebrile, Start Heparin gtt, maintain safety, pain<2  Identify possible barriers to advancing the care plan:   Stability of the patient: Moderately Stable - low risk of patient condition declining or worsening   End of Shift Summary: Pt AAOX4. Afebrile with a Tmax of 36.7. Sating >92% on high flow 50%FIO2, 40L.Patient started on Heparin drip. Current PTT is 135.9, heparin drip stopped  Generalized pain managed with Norco PRN and a 1 time dose of dilaudid (1mg ) PO x1 with relief. Had a bedtime BS of 187, insulin given as ordered per sliding scaled. IV abx given as ordered. Call bell within reach, will continue to monitor.

## 2017-02-13 NOTE — Other
Patients Clinical Goal:   Clinical Goal(s) for the Shift: pain <2/10, OOB free of falls, bs 80-140,   Identify possible barriers to advancing the care plan:   Stability of the patient: Moderately Stable - low risk of patient condition declining or worsening   End of Shift Summary: Weight not obtained due to ruling out DVT in LLE. Pt remained in bed, no PT. Pain managed with prn norco 2 tabs. O2 requirements increased to 50/40 on HF. Lasiv IV continued to be given

## 2017-02-13 NOTE — Progress Notes
MEDICINE ACCEPTANCE NOTE       DATE OF ADMISSION: 02/01/17  TRANSFER TO ICU: 02/08/17  TRANSFER TO MEDICINE: 02/13/2017   HOSPITAL DAY: 12     CHIEF COMPLAINT:   Chief Complaint   Patient presents with   ??? Shortness of Breath     dx with pneumonia at OSH on antibiotics with clear withe phelgm Denies fever.      Last 24 hours/overnight events    - no acute events overnight, continues to be on HFNC  - on heparin gtt   - chest pain around 745, troponin and EKG unremarkable    Medications     Scheduled:    albuterol 2.5 mg Nebulization Q4H   budesonide-formoterol 2 puff Inhalation BID   busPIRone 15 mg Oral BID   carvedilol 12.5 mg Oral BID   cefTRIAXone 1 g Intravenous Q24H   clopidogrel 75 mg Oral Daily   docusate 100 mg Oral BID   DULoxetine 60 mg Oral Daily   furosemide 40 mg Intravenous Daily   gabapentin 800 mg Oral TID   ipratropium 0.5 mg Nebulization Q4H   losartan 100 mg Oral Daily   metroNIDAZOLE 500 mg Oral TID   pantoprazole 40 mg Oral Daily   polyethylene glycol 17 g Oral Daily   pravastatin 40 mg Oral QHS   pyridoxine 100 mg Oral Daily   senna 1 tablet Oral QHS      Continuous:  ??? heparin 25,000 units/250 mL drip RTU Stopped (02/13/17 1610)   ??? sodium chloride        PRN:  insulin aspart **AND** dextrose, diphenhydrAMINE, HYDROcodone-acetaminophen, LORazepam      Physical Exam   Temp:  [35.9 ???C (96.6 ???F)-37.1 ???C (98.8 ???F)] 36.9 ???C (98.4 ???F)  Heart Rate:  [72-90] 74  Resp:  [16-22] 20  BP: (94-170)/(54-80) 170/69  NBP Mean:  [67-107] 98  FiO2 (%):  [40 %-50 %] 45 %  SpO2:  [90 %-94 %] 92 %   UOP: Urine:  [0 mL-700 mL] 450 mL I/Os:     Intake/Output Summary (Last 24 hours) at 02/13/17 9604  Last data filed at 02/13/17 0700   Gross per 24 hour   Intake             1614 ml   Output             1825 ml   Net             -211 ml     Gen: 76 yo [name], pleasant, in no acute distress, appears comfortable, sitting up in chair, HFNC in place  Eyes: PERRL, EOMI  ENT: OP clear, MMM Neck: Supple, trachea midline, no cervical or supraclavicular lymphadenopathy  CV: improved air movement, crackles bilaterally, intermittent expiratory wheezing   Pulm: CTAB, no wheezes/rales/rhonchi, normal work of breathing  Abd: NABS, soft, flat, NT, no masses or organomegaly  Skin: Warm and dry, no rashes  Ext: pedal edema to R leg,  swelling around L ankle   Neuro: A&Ox4, CNII-XII intact, motor 5/5 throughout, sensation grossly intact    Laboratory Data   CBC:  Recent Labs      02/13/17   0503  02/12/17   0652   WBC  5.90  4.99   NEUTABS  3.38  2.64   HGB  12.5*  12.4*   HCT  39.0  39.8   MCV  90.3  90.5   PLT  197  168     BMP:  Recent Labs      02/13/17   0503  02/12/17   0652  02/11/17   0539   NA  138  136  141   K  4.6  4.0  4.1   CL  96  95*  98   CO2  34*  32*  31*   BUN  17  15  17    CREAT  0.85  0.83  0.96   GLUCOSE  135*  121*  109*   CALCIUM  8.9  8.7  8.8   MG  1.9  1.5  1.5     LFT:  No results for input(s): TOTPRO, ALBUMIN, BILITOT, BILICON, ALT, AST, ALKPHOS, GGT, AMYLASE, LIPASE in the last 72 hours.  Microbiology   12/13 BAL:    AFB NTD    Bact NTD    Fungal: Candida    GS: negative    Biofire: negative  Aspergillus Fumigatus 02/05/17: <0.10  Cryptococcal Ag blood 02/06/17: negative  Bacterial Culture Respiratory 02/01/17: Few Probable candida albicans , Aspergillus [place]???   MRSA nares 02/01/17: No Methicillin-resistant Staphylococcus aureus isolated.   Sputum screen 02/01/17: <10 Epithelial cells per low power field   Resp bacterial Cx 02/01/17: <10 Epithelial cells per low power field   Legionella urine 02/01/17: negative     Pathology   Respiratory cytology 02/08/17  FINAL DIAGNOSIS  Negative for malignant cells  .  SMEAR CHARACTERISTICS  Cytospin slide(s)  Histiocytes present  Columnar Cells  Inflammation, acute  Obscuring inflammation  GMS stain is positive for fungal organisms  Scant cellularity  MGG prepared and concurs  Cell block prepared and concurs  .  ANCILLARY TESTING CAVEAT: Cytologic examinations are screening tests. There is a known false negative rate of 5-10 percent and a small false positive rate of 1-2  percent.  Alveolar Macrophages 17 17%  Lymphocytes 4 4%  Neutrophils 79 79%  Eosinophils  <Epithelial Cells> GROUPS    Imaging   EKG 02/13/17   reviewed by me, no significant changes, no ST changes     CXR 02/10/17  Interval stability of left greater than right asymmetric interstitial alveolar opacities in favor of multifocal pneumonia superimposed on edema.  Stable appearance of small layering left effusion and small right effusion. Stable heart and mediastinum. The thoracic aorta is mildly calcified, tortuous and ectatic. No acute findings or significant changes elsewhere.    [place] LE Venous 02/12/17  Common femoral vein: Patent and compressible  Superior femoral vein: Patent and compressible  Mid femoral vein: Patent and compressible  Inferior femoral vein: Patent and compressible  Popliteal vein: Patent and compressible  Calf veins: Patent and compressible, where visualized  ???  IMPRESSION:    No evidence of deep venous thrombosis of the left lower extremity veins.    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  ???  #Mixed hypoxemic/hypercapneic respiratory failure (improving)   ???Acute hypoxemia with increasing O2 requirement and hypercapnia following bronchoscopy. Highest concern for post-procedural inflammation/bronchoconstriction, however differential also includes acute bacterial pneumonia including aspiration in setting of sedation. Initial concern for ???pulmonary aspergillosis given???positive sputum???cultures on admission, however BAL cultures remain negative (+candida only, likely colonizer) with negative galactomanan less suggestive of pathogenic infection. Fungal infection and malignancy remain on differential for  chronic respiratory symptoms with multifocal consolidations and intrathoracic lymphadenopathy. s/p vanc (12/14), pip-tazo (12/14-12/15), s/p voriconazole (12/14-16); held per ID recs given BAL cultures NTD  - d/c IV Ceftriaxone and metronidazole (12/15-20)  - Pulm consulted, appreciate rec's:  - broaden antibiotics to cover HAP: start IV Vancomycin (per pharmacy) and IV piperacillin/tazobactam 3.375 g q8h   - start IV methylprednisolone 40 mg daily x 5 days for  COPD exacerbation/airway inflammation post bronch  - obtain repeat sputum cultures   - respiratory cytology 12/13 negative for malignant cells   - diurese for euvolemia or -500   - ???bipap at night, daily VBG   -???scheduled albuterol/ipratropium nebs???q4h  - budesonide-formoterol 160-4.5 mcg 2 puffs BID   ??????  #HFpEF  Grade I diastolic dysfunction per 01/2017 TTE, EF 70%. Evidence of pulmonary edema on CXR following MICU transfer. Appears on more euvolemic on exam today.   - increase diuresis to IV furosemide 40 mg BID   - start hydralazine 25 mg PO TID   -???goal net > -500cc   - daily weights  - strict ins/outs   ???  #asymetric leg swelling (L)  Likely 2/2 past L ankle injury, also suspicion for DVT given hospitalization and immobility. [place] LE venous 12/17 - no evidence of DVT   - patient was previously on apixaban and clopidogrel   - current on heparin gtt   ???  #Afib  Rate-controlled  - on continuous cardiac monitoring   - carvedilol 25 mg???BID    - holding apixaban 5 mg BID for upcoming EBUS, on heparin gtt   ???  #Intrathoracic LAD  On clarification, there is no well defined pulmonary mass, rather, ''Coalescent airspace and nodular consolidation within the???left upper lobe and lingula,???and to a lesser extent, within the right upper and right lower lobes, consistent with pneumonia, likely fungal. Associated bulky intrathoracic multicompartmental lymphadenopathy'' on CT scan. There is some c/f malignancy given the LAD, am pulm is considering EBUS.  ???  #HTN  relatively hypotensive on transfer, though without signs of end-organ hypoperfusion, MAP maintained >70. Losartan and carvedilol held in MICU for hypotensive.   - restart losartan 100 mg daily in AM   ???  #DM2   Blood glucose at goal   - insulin sliding scale algorithm #3  - gabapentin 800 mg ???TID   - consider restarting home oral metformin upon discharge   ???  #PAD  s/p femoral-femoral graft 2009.  - holding clopidogrel 75 mg daily for EBUS, on heparin gtt   ???  #HLD chronic/stable  - pravastatin 40 mg nightly   ???  #GERD chronic/stable  - pantoprazole 40 mg daily   ???  #depression/ anxiety  Mood stable, no SI, no anxiety  - buspirone 15 mg BID  - duloxetine DR 60   - lorazepam 2 mg PRN BID  ???  #chronic pain. Localized to lower back and lower extremities  - norco 5-325 q6h PRN  - schedule outpatient pain clinic follow up   ???  #genital HSV (resolved)   Throughout admission patient was noted to have active flare. 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   - s/p acyclovir 400mg  po TID (12/10-12/16)    Inpatient Checklist  Diet: Diet 2 gram sodium  Diet carbohydrate controlled  DVT Prophylaxis: apixaban  GI Prophylaxis: PPI  Central Lines: None???  Tubes/Drains: None???  ???  Code Status:???  Full Code    Contact: ???Primary Emergency Contact: Dipaola,Johnnie, Home Phone: [phone]  ???  DWA Dr. Jackelyn Hoehn  Author:  Bascom Levels, MD  [phone]  02/13/2017 8:33 AM

## 2017-02-13 NOTE — Consults
Pharmaceutical Services ??? Vancomycin Dosing (Initial)    Patient Name: Ryan Shepard  MRN: [id number]  Age: 76 y.o.  Sex: male    Vancomycin per Pharmacy Consult Order     Start     Ordered    02/13/17 1021  vancomycin per pharmacy  Per Protocol     Question Answer Comment   Indication Pneumonia    Goal Trough Serum Level 15-20 mcg/mL    Has patient received a dose of this drug within the past 72 hours? No        02/13/17 1022        Vital Signs/Other Objective Data  Allergies: Patient has no known allergies.    BP 156/69  ~ Pulse 74  ~ Temp 36.9 ???C (98.4 ???F) (Oral)  ~ Resp 20  ~ Ht 1.854 m (6' 0.99'')  ~ Wt (!) 103.2 kg (227 lb 8.2 oz)  ~ SpO2 92%  ~ BMI 30.02 kg/m???     Intake/Output Summary (Last 24 hours) at 02/13/17 1041  Last data filed at 02/13/17 0830   Gross per 24 hour   Intake             1319 ml   Output             1125 ml   Net              194 ml       Medications  Med Administrations and Associated Flowsheet Values (last 72 hours)  Vancomycin administration    None        Labs (most recent)  White Blood Cell Count   Date Value Ref Range Status   02/13/2017 5.90 4.16 - 9.95 x10E3/uL Final   01/25/2008 7.82 3.28 - 9.29 x10E3/uL      Urea Nitrogen   Date Value Ref Range Status   02/13/2017 17 7 - 22 mg/dL Final   45/40/9811 33 (H) 7 - 23 mg/dL Final     Creatinine   Date Value Ref Range Status   02/13/2017 0.85 0.60 - 1.30 mg/dL Final   91/47/8295 0.9 0.5 - 1.3 mg/dL      No results found for: Paulo Fruit  Assessment  Indication Pneumonia  Goal trough level 15-20 mcg/mL  Estimated Creatinine Clearance 80.31ml/min  This patient is receiving dialysis: No    Plan  CYNCERE SONTAG is a 76 y.o. male who has been referred to pharmacy for vancomycin dosing. The indication for vancomycin is Pneumonia. Start vancomycin 1250 mg IV q12h. Pharmacy will continue to monitor the patient's clinical progress. The next vancomycin trough is scheduled for 12/19 at 2300. Betti Cruz Waldo Laine, PharmD, 02/13/2017, 10:41 AM

## 2017-02-13 NOTE — Progress Notes
PULMONARY AND CRITICAL CARE CONSULT NOTE    Patient Name: Ryan Shepard  MRN: [id number]  DOB: 12-14-1940  PMD: Wilburn Mylar, MD  Date of Admission: 02/01/2017    Date of Service: 02/12/2017  Hospital Day: 11     Reason for Consultation: hypoxemic respiratory failure    Subjective:   Ryan Shepard is a 76 y.o. year old male with COPD (no PFT available, on tiotropium, budesonide-formoterol), prior tobacco use (>40 pack years, adamant that he quit ~ 20-25 years ago), PAD s/p femoral graft (2009, on plavix qD), HTN, HLD, DM2, pAfib (on apixaban) GERD, depression / anxiety, chronic pain, who presented with worsened dyspnea and sputum production on 12/6, concerning for a COPD exacerbation.  ???  Patient reportedly has had a 3L NC O2 requirement for the past 3 years, although only intermittently uses, but noticed saturations of~ 80% prior to admission.  Endorsed some recent lower extremity swelling, and orthopnea, and was recently started on diuretics.  Endorses two prior hospitalizations in the past year for presumed COPD exacerbations, including one admission to the ICU at an OSH in [place], but denies any history of intubation or NIPPV.  Admitted on 12/6 for presumed COPD exacerbation reported in the setting of ongoing tobacco use; CXR demonstrating confluent airspace opacities concerning for multifocal aspiration/pneumonia although procalcitonin was negative at that time..  Patient endorsed worsened dyspnea (exertional > rest), as well as increased productive cough with ''variable'' color sputum, including ''rust.''  Denies any frank hemoptysis, fevers, chills, night sweats, chest pain, palpitations; thinks he may have been losing weight although is uncertain.  ???  Denies any history of homelessness, incarceration or recent travel outside of the country.  Went to [place] in the summer, and [place] in the fall.  ???  Patient was treated with levofloxacin, pred 40 x 5 days, and duonebs.  On 12/11, patient was deemed ready for discharge with a stable 2-3L NC requirement (although per patient not back to his baseline); however, respiratory cultures from admission demonstrated candida and aspergillus species.  Patient underwent CT chest demonstrating: nodular consolidation within LUL and lingula, as well as RUL and RLL read as consistent with likely fungal infection.  In addition, patient found to have  bulky intrathoracic multicompartamental lymphadenopathy.   ID consultation recommended pulmonary evaluation for bronchoscopy with likely biopsy.  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  INTERVAL:  12/17: feels he is breathing easier, still on HFNC 40L/40% FIO2, BAL cx NGTD, cytology pending    Objective:   Vital Signs:  Temp:  [36.8 ???C (98.2 ???F)-37.1 ???C (98.8 ???F)] 37.1 ???C (98.8 ???F)  Heart Rate:  [73-90] 74  Resp:  [12-17] 16  BP: (98-143)/(63-78) 98/63  NBP Mean:  [74-87] 74  FiO2 (%):  [40 %-50 %] 50 %  SpO2:  [90 %-91 %] 90 %      Intake/Output Summary (Last 24 hours) at 02/12/17 1931  Last data filed at 02/12/17 1851   Gross per 24 hour   Intake             1480 ml   Output             2075 ml   Net             -595 ml       FiO2 (%):  [40 %-50 %] 50 %  Rate:  [12] 12    Physical Exam:  Gen: Well-developed  male in no acute distress  Eyes: PERRL, EOMI  ENT: OP clear, MMM  Neck: Supple, trachea midline, no cervical or supraclavicular lymphadenopathy  CV: RRR, normal S1S2, no m/r/g  Pulm: No wheezing, poor air movement L side Crackles at RLL, adventitial sounds in LUL.  Abd: NABS, soft, flat, NTND  Skin: Warm and dry, no rashes  Ext: No cyanosis or edema. Distal pulses 2+ and symmetric  Neuro: A&Ox4, CNII-XII intact    Medications:  Scheduled:   ??? albuterol  2.5 mg Nebulization Q4H   ??? apixaban  5 mg Oral BID   ??? budesonide-formoterol  2 puff Inhalation BID   ??? busPIRone  15 mg Oral BID   ??? carvedilol  12.5 mg Oral BID ??? cefTRIAXone  1 g Intravenous Q24H   ??? clopidogrel  75 mg Oral Daily   ??? docusate  100 mg Oral BID   ??? DULoxetine  60 mg Oral Daily   ??? furosemide  40 mg Intravenous Daily   ??? gabapentin  800 mg Oral TID   ??? ipratropium  0.5 mg Nebulization Q4H   ??? losartan  100 mg Oral Daily   ??? metroNIDAZOLE  500 mg Oral TID   ??? pantoprazole  40 mg Oral Daily   ??? polyethylene glycol  17 g Oral Daily   ??? pravastatin  40 mg Oral QHS   ??? pyridoxine  100 mg Oral Daily   ??? senna  1 tablet Oral QHS     Continuous Transfusions:   ??? sodium chloride       PRN: insulin aspart **AND** dextrose, diphenhydrAMINE, HYDROcodone-acetaminophen, LORazepam    Labs:  CBC: WBC/Hgb/Hct/Plts:  4.99/12.4/39.8/168 (12/17 4782)  Chemistry:Na/K/Cl/CO2/BUN/Cr/glu:  136/4.0/95/32/15/0.83/121 (12/17 0652)  Coags:    LFTS:    ABG:      POC Glucose:   Recent Labs      02/12/17   1826  02/12/17   1252  02/12/17   0903  02/11/17   2254   GLUCOSEPOC  146*  140*  90  183*       Microbiology:   12/13 BAL:    AFB NTD    Bact NTD    Fungal: Candida    GS: negative    Biofire: negative  Aspergillus Fumigatus 02/05/17: <0.10  Cryptococcal Ag blood 02/06/17: negative  Bacterial Culture Respiratory 02/01/17: Few Probable candida albicans , Aspergillus [place]???   MRSA nares 02/01/17: No Methicillin-resistant Staphylococcus aureus isolated.   Sputum screen 02/01/17: <10 Epithelial cells per low power field   Resp bacterial Cx 02/01/17: <10 Epithelial cells per low power field   Legionella urine 02/01/17: negative     Imaging:  02/05/2017 CT Chest w/o contrast:  IMPRESSION:  Atherosclerotic calcification of the thoracic aorta, brachiocephalic, bilateral common carotid, bilateral subclavian and bilateral axillary, left anterior descending, circumflex and right coronary arteries. Enlargement of the central pulmonary arteries, the main pulmonary artery currently 39 mm in maximal axial diameter, with associated calcifications, consistent with pulmonary arterial hypertension. Small left pleural effusion. Small pericardial effusion.??????Moderate smoking-related centrilobular emphysema.??????Coalescent airspace and nodular consolidation within the left upper lobe and lingula, and to a lesser extent, within the right upper and right lower lobes, consistent with pneumonia, likely fungal. Associated bulky intrathoracic multicompartmental lymphadenopathy; for example, AP window lymph node, currently 38 x 23 mm (3-40).    02/01/2017 TTE:  ???1. Technically difficult study due to poor acoustic windows.  ???2. Small LV size, moderate concentric left ventricular hypertrophy, hyperdynamic systolic function, mild LV diastolic dysfunction (  grade I, impaired relaxation).  ???3. Left ventricular ejection fraction is approximately 70-75%.  ???4. Normal right ventricular size and normal systolic function.  ???5. There are no prior echocardiograms available for comparison.    Pathology:   BAL cytology: pending    Problem Based Assessment and Plan     Ryan Shepard???is a 76 y.o.???male???with history of COPD (on baseline 3L), grade I diastolic dysfunction, atrial fibrillation (on apixaban), HTN, DM2, PAD s/p femoral graft, GERD admitted to [facility]  12/6 for dyspnea with LUL consolidation; transferred to MICU 12/13 for worsening mixed hypoxemic/hypercapneic respiratory failure.  ???  # Mixed hypoxemic/hypercapneic respiratory failure- ddx includes V/Q mismatch vs shunt (has L effusion) vs diffusion impairment. Rec continued support with HFNC and pulm hygiene.  # Bulky Mediastinal lymphadenopathy- concerning for malignancy vs 2/2 infectious process.   # Multifocal bilateral pneumonia with nodular changes- concerning for malignancy vs infection vs combined both. Recommend F/U BAL cytology, if negative will likely consider inpatient EBUS.    Summary of Recommendations:  - Please stop apixaban, transition to heparin gtt in case patient needs an EBUS for diagnosis - Continue support with HFNC, titrate to Spo2 >88%  - Aggressive pulmonary hygiene: IS and aerobika q4h by RT  - Agree with gentle diuresis in case of HFpEF component to hypoxia  - F/U bronch studies including BAL cytology, if negative will consider inpatient EBUS    Patient was discussed with attending, Dr. Hanley Hays.    Newt Lukes, MD  Pulmonary and Critical Care Medicine Fellow  Pager [phone]    I have seen and examined this patient, reviewed pertinent labs and studies, and I agree with Dr. Michaelene Song note, assessment, and plan, unless noted otherwise and documented below.  Positive review of systems as stated in the fellow???s note, otherwise all other systems were reviewed and are negative. PMH, FH and, SH have been reviewed and are stated when pertinent, otherwise they are non-contributory to the medical care of this patient. The fellow's note was edited to reflect my findings and recommendations.  Please see fellow???s note for additional details.    Will likely need diagnostic EBUS with biopsy, if cytology from BAL not revealing

## 2017-02-14 ENCOUNTER — Encounter: Payer: Self-pay | Admitting: Internal Medicine

## 2017-02-14 DIAGNOSIS — R590 Localized enlarged lymph nodes: Secondary | ICD-10-CM

## 2017-02-14 DIAGNOSIS — R918 Other nonspecific abnormal finding of lung field: Secondary | ICD-10-CM

## 2017-02-14 DIAGNOSIS — J9621 Acute and chronic respiratory failure with hypoxia: Secondary | ICD-10-CM

## 2017-02-14 LAB — Nocardia Culture: NOCARDIA CULTURE: NEGATIVE

## 2017-02-14 LAB — Sputum Screen: SPUTUM SCREEN: <10

## 2017-02-14 LAB — Bacterial Culture Respiratory: BACTERIAL CULTURE RESPIRATORY: NORMAL

## 2017-02-14 LAB — Legionella Culture
LEGIONELLA CULTURE: NEGATIVE
LEGIONELLA CULTURE: NEGATIVE

## 2017-02-14 LAB — CBC: HEMATOCRIT: 37.5 — ABNORMAL LOW (ref 38.5–52.0)

## 2017-02-14 LAB — Blood Gases,venous: O2 SATURATION/MEASURED: 72

## 2017-02-14 LAB — Differential Automated: IMMATURE GRANULOCYTES%: 2.1 (ref 1.80–6.90)

## 2017-02-14 LAB — Magnesium: MAGNESIUM: 1.7 meq/L (ref 1.4–1.9)

## 2017-02-14 LAB — Glucose,POC
GLUCOSE,POC: 234 mg/dL — ABNORMAL HIGH (ref 65–99)
GLUCOSE,POC: 271 mg/dL — ABNORMAL HIGH (ref 65–99)
GLUCOSE,POC: 150 mg/dL — ABNORMAL HIGH (ref 65–99)

## 2017-02-14 LAB — APTT
APTT: 124.8 s — ABNORMAL HIGH (ref 24.4–36.2)
APTT: 53.5 s — ABNORMAL HIGH (ref 24.4–36.2)
APTT: 63.5 s — ABNORMAL HIGH (ref 24.4–36.2)
APTT: 66.1 s — ABNORMAL HIGH (ref 24.4–36.2)

## 2017-02-14 LAB — Basic Metabolic Panel
ANION GAP: 12 mmol/L (ref 8–19)
GFR ESTIMATE FOR NON-AFRICAN AMERICAN: 73 mg/dL (ref 65–99)

## 2017-02-14 LAB — Fungal Culture Respiratory

## 2017-02-14 MED ADMIN — HEPARIN 25,000 UNITS/250 ML 0.45% NACL RTU: 1400 [IU]/h | INTRAVENOUS | @ 02:00:00 | Stop: 2017-02-16 | NDC 00409765062

## 2017-02-14 MED ADMIN — IPRATROPIUM BROMIDE 0.02 % IN SOLN: 500 ug | RESPIRATORY_TRACT | @ 04:00:00 | Stop: 2017-02-19 | NDC 00487980101

## 2017-02-14 MED ADMIN — FUROSEMIDE 10 MG/ML IJ SOLN: 40 mg | INTRAVENOUS | @ 18:00:00 | Stop: 2017-02-15 | NDC 36000028325

## 2017-02-14 MED ADMIN — DOCUSATE SODIUM 100 MG PO CAPS: 100 mg | ORAL | @ 18:00:00 | Stop: 2017-02-22 | NDC 00904645561

## 2017-02-14 MED ADMIN — HEPARIN 25,000 UNITS/250 ML 0.45% NACL RTU: 1400 [IU]/h | INTRAVENOUS | @ 19:00:00 | Stop: 2017-02-16 | NDC 00409765062

## 2017-02-14 MED ADMIN — ALBUTEROL SULFATE (5 MG/ML) 0.5% IN NEBU: 2.5 mg | RESPIRATORY_TRACT | @ 12:00:00 | Stop: 2017-02-19 | NDC 00487990130

## 2017-02-14 MED ADMIN — VANCOMYCIN 250 ML IVPB: 1.25 g | INTRAVENOUS | @ 19:00:00 | Stop: 2017-02-15 | NDC 47781059791

## 2017-02-14 MED ADMIN — FUROSEMIDE 10 MG/ML IJ SOLN: 40 mg | INTRAVENOUS | Stop: 2017-02-15 | NDC 36000028325

## 2017-02-14 MED ADMIN — LOSARTAN POTASSIUM 50 MG PO TABS: 100 mg | ORAL | @ 18:00:00 | Stop: 2017-02-22 | NDC 68084034701

## 2017-02-14 MED ADMIN — HEPARIN 25,000 UNITS/250 ML 0.45% NACL RTU: 1400 [IU]/h | INTRAVENOUS | @ 23:00:00 | Stop: 2017-02-16 | NDC 00409765062

## 2017-02-14 MED ADMIN — INSULIN ASPART 100 UNIT/ML SC SOPN: 3 mL | SUBCUTANEOUS | @ 18:00:00 | Stop: 2017-02-22 | NDC 00169633910

## 2017-02-14 MED ADMIN — FUROSEMIDE 10 MG/ML IJ SOLN: 40 mg | INTRAVENOUS | @ 01:00:00 | Stop: 2017-02-15 | NDC 36000028325

## 2017-02-14 MED ADMIN — DOCUSATE SODIUM 100 MG PO CAPS: 100 mg | ORAL | @ 04:00:00 | Stop: 2017-02-22 | NDC 00904645561

## 2017-02-14 MED ADMIN — HYDRALAZINE HCL 25 MG PO TABS: 25 mg | ORAL | @ 21:00:00 | Stop: 2017-02-14 | NDC 00904644161

## 2017-02-14 MED ADMIN — PANTOPRAZOLE SODIUM 40 MG PO TBEC: 40 mg | ORAL | @ 18:00:00 | Stop: 2017-02-22 | NDC 68084081309

## 2017-02-14 MED ADMIN — INSULIN ASPART 100 UNIT/ML SC SOPN: 3 mL | SUBCUTANEOUS | @ 02:00:00 | Stop: 2017-02-22 | NDC 00169633910

## 2017-02-14 MED ADMIN — HYDROCODONE-ACETAMINOPHEN 5-325 MG PO TABS: 2 | ORAL | @ 13:00:00 | Stop: 2017-02-22 | NDC 00406012362

## 2017-02-14 MED ADMIN — METHYLPREDNISOLONE SODIUM SUCC 40 MG IJ SOLR: 40 mg | INTRAVENOUS | @ 18:00:00 | Stop: 2017-02-18 | NDC 00009003928

## 2017-02-14 MED ADMIN — IPRATROPIUM BROMIDE 0.02 % IN SOLN: 500 ug | RESPIRATORY_TRACT | @ 22:00:00 | Stop: 2017-02-19 | NDC 00487980101

## 2017-02-14 MED ADMIN — GABAPENTIN 400 MG PO CAPS: 800 mg | ORAL | @ 13:00:00 | Stop: 2017-02-22 | NDC 68084077401

## 2017-02-14 MED ADMIN — SENNOSIDES 8.6 MG PO TABS: 1 | ORAL | @ 04:00:00 | Stop: 2017-02-21 | NDC 00904652261

## 2017-02-14 MED ADMIN — BUSPIRONE HCL 5 MG PO TABS: 15 mg | ORAL | @ 18:00:00 | Stop: 2017-02-21 | NDC 16729020001

## 2017-02-14 MED ADMIN — ALBUTEROL SULFATE (2.5 MG/3ML) 0.083% IN NEBU: 2.5 mg | RESPIRATORY_TRACT | @ 04:00:00 | Stop: 2017-02-14 | NDC 00487950101

## 2017-02-14 MED ADMIN — PIPERACILLIN-TAZOBACTAM 3.375 GM/50 ML RTU (EXT 4HR): 3.375 g | INTRAVENOUS | @ 08:00:00 | Stop: 2017-02-18 | NDC 00206886102

## 2017-02-14 MED ADMIN — PIPERACILLIN-TAZOBACTAM 3.375 GM/50 ML RTU (EXT 4HR): 3.375 g | INTRAVENOUS | @ 23:00:00 | Stop: 2017-02-18 | NDC 00206886102

## 2017-02-14 MED ADMIN — GABAPENTIN 400 MG PO CAPS: 800 mg | ORAL | @ 04:00:00 | Stop: 2017-02-22 | NDC 68084077401

## 2017-02-14 MED ADMIN — BUDESONIDE-FORMOTEROL FUMARATE 160-4.5 MCG/ACT IN AERO: 2 | RESPIRATORY_TRACT | @ 04:00:00 | Stop: 2017-02-22 | NDC 00186037028

## 2017-02-14 MED ADMIN — ALBUTEROL SULFATE (5 MG/ML) 0.5% IN NEBU: 2.5 mg | RESPIRATORY_TRACT | @ 08:00:00 | Stop: 2017-02-19 | NDC 00487990130

## 2017-02-14 MED ADMIN — IPRATROPIUM BROMIDE 0.02 % IN SOLN: 500 ug | RESPIRATORY_TRACT | Stop: 2017-02-19 | NDC 00487980101

## 2017-02-14 MED ADMIN — INSULIN ASPART 100 UNIT/ML SC SOPN: 3 mL | SUBCUTANEOUS | @ 06:00:00 | Stop: 2017-02-22 | NDC 00169633910

## 2017-02-14 MED ADMIN — BUSPIRONE HCL 5 MG PO TABS: 15 mg | ORAL | @ 04:00:00 | Stop: 2017-02-21 | NDC 16729020001

## 2017-02-14 MED ADMIN — GABAPENTIN 400 MG PO CAPS: 800 mg | ORAL | @ 21:00:00 | Stop: 2017-02-22 | NDC 68084077401

## 2017-02-14 MED ADMIN — INSULIN ASPART 100 UNIT/ML SC SOPN: 3 mL | SUBCUTANEOUS | @ 21:00:00 | Stop: 2017-02-22 | NDC 00169633910

## 2017-02-14 MED ADMIN — CARVEDILOL 12.5 MG PO TABS: 12.5 mg | ORAL | @ 18:00:00 | Stop: 2017-02-15 | NDC 68084086501

## 2017-02-14 MED ADMIN — HEPARIN 25,000 UNITS/250 ML 0.45% NACL RTU: 1400 [IU]/h | INTRAVENOUS | @ 16:00:00 | Stop: 2017-02-16

## 2017-02-14 MED ADMIN — HYDRALAZINE HCL 25 MG PO TABS: 25 mg | ORAL | @ 04:00:00 | Stop: 2017-02-14 | NDC 00904644161

## 2017-02-14 MED ADMIN — IPRATROPIUM BROMIDE 0.02 % IN SOLN: 500 ug | RESPIRATORY_TRACT | @ 18:00:00 | Stop: 2017-02-19 | NDC 00487980101

## 2017-02-14 MED ADMIN — PYRIDOXINE HCL 50 MG PO TABS: 100 mg | ORAL | @ 18:00:00 | Stop: 2017-02-22 | NDC 00536440801

## 2017-02-14 MED ADMIN — PIPERACILLIN-TAZOBACTAM 3.375 GM/50 ML RTU (EXT 4HR): 3.375 g | INTRAVENOUS | @ 14:00:00 | Stop: 2017-02-18 | NDC 00206886102

## 2017-02-14 MED ADMIN — IPRATROPIUM BROMIDE 0.02 % IN SOLN: 500 ug | RESPIRATORY_TRACT | @ 12:00:00 | Stop: 2017-02-19 | NDC 00487980101

## 2017-02-14 MED ADMIN — PRAVASTATIN SODIUM 40 MG PO TABS: 40 mg | ORAL | @ 04:00:00 | Stop: 2017-02-22 | NDC 51079078220

## 2017-02-14 MED ADMIN — HYDROCODONE-ACETAMINOPHEN 5-325 MG PO TABS: 2 | ORAL | @ 06:00:00 | Stop: 2017-02-22 | NDC 00406012362

## 2017-02-14 MED ADMIN — CARVEDILOL 12.5 MG PO TABS: 12.5 mg | ORAL | @ 04:00:00 | Stop: 2017-02-15 | NDC 68084086501

## 2017-02-14 MED ADMIN — HEPARIN 25,000 UNITS/250 ML 0.45% NACL RTU: 1400 [IU]/h | INTRAVENOUS | Stop: 2017-02-16 | NDC 00409765062

## 2017-02-14 MED ADMIN — ALBUTEROL SULFATE (5 MG/ML) 0.5% IN NEBU: 2.5 mg | RESPIRATORY_TRACT | @ 18:00:00 | Stop: 2017-02-19 | NDC 00487990130

## 2017-02-14 MED ADMIN — HYDROCODONE-ACETAMINOPHEN 5-325 MG PO TABS: 2 | ORAL | @ 17:00:00 | Stop: 2017-02-22 | NDC 00406012362

## 2017-02-14 MED ADMIN — HYDROCODONE-ACETAMINOPHEN 5-325 MG PO TABS: 2 | ORAL | Stop: 2017-02-22 | NDC 00406012362

## 2017-02-14 MED ADMIN — POLYETHYLENE GLYCOL 3350 17 G PO PACK: 17 g | ORAL | @ 18:00:00 | Stop: 2017-02-22

## 2017-02-14 MED ADMIN — ALBUTEROL SULFATE (2.5 MG/3ML) 0.083% IN NEBU: 2.5 mg | RESPIRATORY_TRACT | Stop: 2017-02-14 | NDC 00487950101

## 2017-02-14 MED ADMIN — ALBUTEROL SULFATE (5 MG/ML) 0.5% IN NEBU: 2.5 mg | RESPIRATORY_TRACT | @ 22:00:00 | Stop: 2017-02-19 | NDC 00487990130

## 2017-02-14 MED ADMIN — VANCOMYCIN 250 ML IVPB: 1.25 g | INTRAVENOUS | @ 06:00:00 | Stop: 2017-02-15 | NDC 47781059791

## 2017-02-14 MED ADMIN — HEPARIN 25,000 UNITS/250 ML 0.45% NACL RTU: 1400 [IU]/h | INTRAVENOUS | @ 07:00:00 | Stop: 2017-02-16

## 2017-02-14 MED ADMIN — IPRATROPIUM BROMIDE 0.02 % IN SOLN: 500 ug | RESPIRATORY_TRACT | @ 08:00:00 | Stop: 2017-02-19 | NDC 00487980101

## 2017-02-14 MED ADMIN — HYDRALAZINE HCL 25 MG PO TABS: 25 mg | ORAL | @ 13:00:00 | Stop: 2017-02-14 | NDC 00904644161

## 2017-02-14 MED ADMIN — HYDROCODONE-ACETAMINOPHEN 5-325 MG PO TABS: 2 | ORAL | @ 23:00:00 | Stop: 2017-02-22 | NDC 00406012362

## 2017-02-14 MED ADMIN — BUDESONIDE-FORMOTEROL FUMARATE 160-4.5 MCG/ACT IN AERO: 2 | RESPIRATORY_TRACT | @ 18:00:00 | Stop: 2017-02-22 | NDC 00186037028

## 2017-02-14 MED ADMIN — DULOXETINE HCL 60 MG PO CPEP: 60 mg | ORAL | @ 18:00:00 | Stop: 2017-02-22 | NDC 00904645461

## 2017-02-14 NOTE — Consults
Occupational Therapy Evaluation      PATIENT: Ryan Shepard  MRN: [id number]  DOB: 08/22/1940    ADMIT DATE: 02/01/2017       Date of Evaluation: 02/14/2017    Problems: Active Problems:    Lower back pain POA: Yes    OA (osteoarthritis) of knee POA: Yes    Primary osteoarthritis of knees, bilateral POA: Yes    COPD exacerbation (HCC/RAF) POA: Yes    COPD (chronic obstructive pulmonary disease) (HCC/RAF) POA: Yes    Chronic prescription opiate use POA: Not Applicable    Diabetes (HCC/RAF) POA: Yes    PAD (peripheral artery disease) (HCC/RAF) POA: Yes    Hypertension POA: Yes    Hypercholesteremia POA: Yes    GERD (gastroesophageal reflux disease) POA: Yes    Pneumonia POA: Unknown    Lower extremity edema POA: Unknown    Memory loss POA: Unknown    Genital herpes POA: Clinically Undetermined       Past Medical History:   Diagnosis Date   ??? Cancer (HCC/RAF)    ??? Diabetes (HCC/RAF)    ??? GERD (gastroesophageal reflux disease)    ??? Hypercholesteremia    ??? Hypertension    ??? PAD (peripheral artery disease) (HCC/RAF)    ??? 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 Prostate disease    ??? Scoliosis     adolesent   ??? Vascular disease     Past Surgical History:   Procedure Laterality Date   ??? BACK SURGERY     ??? BILATERAL FEMORAL ARTERY EXPLORATION  2009   ??? BLADDER SURGERY  1999   ??? PROSTATE SURGERY  2000        Relevant Hospital Course: Pt is a 76 yo male history of COPD (on baseline 3L), grade I diastolic dysfunction, atrial fibrillation (on apixaban),???HTN, DM2, PAD s/p femoral graft, GERD admitted to [facility] ???12/6 for dyspnea with LUL consolidation; transferred to MICU 12/13 for worsening mixed hypoxemic/hypercapneic respiratory failure, now back to floor. [place] L LE negative for DVT. Pt w/ chest pain 12/18. trops and EKG negative.    Patient Stated Goal:  Return home w/ wife     Living Arrangements   Type of Home: House  Home Layout: Two level, Able to live on main level with bedroom/bathroom # Stairs in home:  (NA-will stay downstairs)  Bathroom Shower/Tub: Tub  Bathroom Toilet: Midwife: Paediatric nurse, Grab bars around toilet  Home Equipment: Front wheeled walker, Medical laboratory scientific officer, Other (Comment) (electric bed)    Prior Level of Function   Level of Independence: Independent, Community ambulation  Lives With: Spouse  Support Available: Family  # of hours available: 24/7 available from capable spouse  ADL Assistance: Independent  Homemaking Assistance: Independent  Vision: Within Functional Limits  Hearing: Within Education administrator: Drives Self    Precautions   Precautions: Museum/gallery exhibitions officer;Check Labs;Fall risk  Orthotic: None  Current Activity Order: Order implies OOB  Weight Bearing Status: Not Applicable      GENERAL EVALUATION   Patient Presentation: Up in chair;IV;Oxygen;Cardiac monitor;Pulse ox (RN present)    Bed Mobility   Supine Scooting: Not Performed  Rolling: Not Performed  Supine to Sit: Not Performed (Pt up in chair)  Sit to Supine: Not Performed (Pt left up in chair at end of eval)    Functional Transfers   Sit to Stand: Stand by Assist  Transfer: From;Chair;To;Toilet  Level of Assist: Stand by  Assist  Type of Transfer: Front wheeled Barista Transfers: Stand by Assist  Functional Mobility: Stand by Assist;Front wheeled walker    Activities of Daily Living (ADLs)   Eating: Not performed  Grooming: Performed  Grooming Assistance: Supervised  Grooming Deficit: Supervision/safety  Grooming Where Assessed: Standing sinkside  Bathing: Not performed  UB Dressing: Not performed  LB Dressing: Performed  LB Dressing Assistance: Supervised  LB Dressing Deficit: Don/doff R sock;Don/doff L sock  LB Dressing Adaptive Equipment: None  LB Dressing Where Assessed: Chair  Toileting: Not performed    Balance   Sitting - Static: Good;without UE support  Sitting - Dynamic: Good;without UE support     RUE Assessment   RUE Assessment: Within Functional Limits LUE Assessment   LUE Assessment: Within Functional Limits              Hand Function   Gross Grasp: Functional  Coordination: Functional    Edema   Edema: None noted    Sensation   Sensation: Grossly intact    Cognition   Cognition: Within Defined Limits  Safety Awareness: Good awareness of safety precautions  Barriers to Learning: None    Vision   Current Vision: Wears glasses only for reading  Visual History: No reported problems  Complex Visual Assessment: Not performed            Pain Assessment   Patient complains of pain: No                        Patient Status   Activity Tolerance: Good  Oxygen Needs: Nasal cannula;Hi flow  Flow (& FiO2): 40% FiO2 40 L/Min. Face Mask 25L used for functional mobility w/ FWW  Response to Treatment: Tolerated treatment well;Vital signs stable  Compliance with Precautions: Not Applicable  Call light in reach: Yes  Presentation post treatment: Up in chair;On cardiac monitor;On oxygen;Lines/drains intact;Pulse Ox (HFNC)  Comments: Pt received sitting up in chair. Just finished bathing tasks. Pt SBA/Sup for ADLs, transfers, and functional mobility w/ FWW. Educated pt on energy conservation/pacing self. Pt verbalized understanding. No further OT needs indicated at this time.     Interdisciplinary Communication   Interdisciplinary Communication: Nurse      ASSESSMENT   Rehab Potential: Good  Inpatient Recommendation: OT evaluate & discharge                     OT Recommendations   Discharge Recommendation: No skilled OT needs upon discharge  Discharge concerns: Requires supervision for self care;Requires supervision for mobility  Discharge Equipment Recommended: Owns recommended DME  Equipment ordered: Not applicable    AM-PAC   AM-PAC Daily Activity t-Scale Score: 40.22  AM-PAC Daily Activity CMS 'G Code' Modifier: CK          G-codes   Functional Limitation Reporting: Patient's primary goal for occupational therapy is to be able to complete ADLs.  The patient's current impairment level is    At least 1 percent but less than 20 percent impaired, limited or restricted   (   Self Care Current Status (X9147): CI  ) based on clinical assessment.  The patient is expected to improve to    At least 1 percent but less than 20 percent impaired, limited or restricted   (   Self Care Goal Status (W2956): CI  ) after 30 days of occupational therapy.      Evaluation Completed by: Juanita Laster, OT,  02/14/2017

## 2017-02-14 NOTE — Progress Notes
MEDICINE ACCEPTANCE NOTE       DATE OF ADMISSION: 02/01/17  TRANSFER TO ICU: 02/08/17  TRANSFER TO MEDICINE: 02/14/2017   HOSPITAL DAY: 13     CHIEF COMPLAINT:   Chief Complaint   Patient presents with   ??? Shortness of Breath     dx with pneumonia at OSH on antibiotics with clear withe phelgm Denies fever.      Last 24 hours/overnight events    - no acute events overnight, continues to be on HFNC 40L/min FiO2 40%  - on heparin gtt  - denies SOB, CP, continues to have productive cough     Medications     Scheduled:    albuterol 2.5 mg Nebulization Q4H   budesonide-formoterol 2 puff Inhalation BID   busPIRone 15 mg Oral BID   carvedilol 12.5 mg Oral BID   docusate 100 mg Oral BID   DULoxetine 60 mg Oral Daily   furosemide 40 mg Intravenous BID (diuretic)   gabapentin 800 mg Oral TID   hydrALAZINE 25 mg Oral TID   ipratropium 0.5 mg Nebulization Q4H   losartan 100 mg Oral Daily   methylPREDNISolone IV 40 mg Intravenous Daily   pantoprazole 40 mg Oral Daily   piperacillin/tazobactam 3.375 g Intravenous Q8H   polyethylene glycol 17 g Oral Daily   pravastatin 40 mg Oral QHS   pyridoxine 100 mg Oral Daily   senna 1 tablet Oral QHS   vancomycin 1.25 g Intravenous Q12H      Continuous:  ??? heparin 25,000 units/250 mL drip RTU 1,300 Units/hr (02/14/17 1038)   ??? sodium chloride 5 mL/hr (02/13/17 1203)      PRN:  insulin aspart **AND** dextrose, diphenhydrAMINE, HYDROcodone-acetaminophen, LORazepam      Physical Exam   Temp:  [36.3 ???C (97.3 ???F)-37 ???C (98.6 ???F)] 36.6 ???C (97.9 ???F)  Heart Rate:  [76-93] 86  Resp:  [16-22] 20  BP: (119-162)/(55-76) 162/76  NBP Mean:  [75-101] 101  FiO2 (%):  [40 %-50 %] 40 %  SpO2:  [89 %-94 %] 91 %   UOP: Urine:  [250 mL-800 mL] 650 mL I/Os:     Weights:   103.2 kg (02/13/17)  102.1 kg (02/12/17)  102.9 kg (02/11/17)  103.4 kg (02/01/17)      Intake/Output Summary (Last 24 hours) at 02/14/17 1250  Last data filed at 02/14/17 1000   Gross per 24 hour   Intake             1034 ml Output             2150 ml   Net            -1116 ml     Gen: 76 yo [name], pleasant, in no acute distress, appears comfortable, sitting up in chair, HFNC in place  Eyes: PERRL, EOMI  ENT: OP clear, MMM  Neck: Supple, trachea midline, no cervical or supraclavicular lymphadenopathy  CV: improved air movement, crackles bilaterally, intermittent expiratory wheezing   Pulm: CTAB, no wheezes/rales/rhonchi, normal work of breathing  Abd: NABS, soft, flat, NT, no masses or organomegaly  Skin: Warm and dry, no rashes  Ext: 1+ edema bilaterally,  swelling around L ankle   Neuro: A&Ox4, CNII-XII intact, motor 5/5 throughout, sensation grossly intact    Laboratory Data   CBC:  Recent Labs      02/14/17   0528  02/13/17   0503  02/12/17   0652   WBC  6.33  5.90  4.99   NEUTABS  4.93  3.38  2.64   HGB  12.1*  12.5*  12.4*   HCT  37.5*  39.0  39.8   MCV  88.2  90.3  90.5   PLT  240  197  168     BMP:  Recent Labs      02/14/17   0528  02/13/17   0503  02/12/17   0652   NA  134*  138  136   K  5.0  4.6  4.0   CL  93*  96  95*   CO2  29  34*  32*   BUN  19  17  15    CREAT  1.00  0.85  0.83   GLUCOSE  170*  135*  121*   CALCIUM  8.4*  8.9  8.7   MG  1.7  1.9  1.5     LFT:  No results for input(s): TOTPRO, ALBUMIN, BILITOT, BILICON, ALT, AST, ALKPHOS, GGT, AMYLASE, LIPASE in the last 72 hours.  Microbiology   Resp cx (bacterial) 02/13/17: prelim - normal respiratory tract flora isolated   Resp cx (fungal) 02/13/17: NGTD   12/13 BAL:    AFB NTD    Bact NTD    Fungal: Candida    GS: negative    Biofire: negative  Aspergillus Fumigatus 02/05/17: <0.10  Cryptococcal Ag blood 02/06/17: negative  Bacterial Culture Respiratory 02/01/17: Few Probable candida albicans , Aspergillus [place]???   MRSA nares 02/01/17: No Methicillin-resistant Staphylococcus aureus isolated.   Sputum screen 02/01/17: <10 Epithelial cells per low power field   Resp bacterial Cx 02/01/17: <10 Epithelial cells per low power field   Legionella urine 02/01/17: negative Pathology   Respiratory cytology 02/08/17  FINAL DIAGNOSIS  Negative for malignant cells    Imaging   EKG 02/13/17   Report pending     Assessment/Plan   Ryan Shepard???is a 76 y.o.???male???with history of COPD (on baseline 3L), grade I diastolic dysfunction, atrial fibrillation (on apixaban),???HTN, DM2, PAD s/p femoral graft, GERD admitted to [facility] ???12/6 for dyspnea with LUL consolidation; transferred to MICU 12/13 for worsening mixed hypoxemic/hypercapneic respiratory failure.  ???  #Mixed hypoxemic/hypercapneic respiratory failure (improving)   ???Acute hypoxemia with increasing O2 requirement and hypercapnia following bronchoscopy. Highest concern for post-procedural inflammation/bronchoconstriction, however differential also includes acute bacterial pneumonia including aspiration in setting of sedation. Initial concern for ???pulmonary aspergillosis given???positive sputum???cultures on admission, however BAL cultures remain negative (+candida only, likely colonizer) with negative galactomanan less suggestive of pathogenic infection. Fungal infection and malignancy remain on differential for chronic respiratory symptoms with multifocal consolidations and intrathoracic lymphadenopathy. s/p vanc (12/14), pip-tazo (12/14-12/15), s/p voriconazole (12/14-16); held per ID recs given BAL cultures NTD, s/p IV Ceftriaxone and metronidazole (12/15-20)  - Pulm consulted, appreciate rec's:  - on HFNC 40L/min, FiO2 40%; wean as tolerated for SpO2 >88%  - continue IV Vancomycin (per pharmacy) and IV piperacillin/tazobactam 3.375 g q8h (12/18-  - continue IV methylprednisolone 40 mg daily x 5 days for  COPD exacerbation/airway inflammation post bronch (12/18-  - follow repeat sputum cultures from 02/13/18, NGTD  - respiratory cytology 12/13 negative for malignant cells   - pulm planning for EBUS once patient off clopidogrel for at least 5 days   - diurese for euvolemia or -500   - ???bipap at night, daily VBG -???scheduled albuterol/ipratropium nebs???q4h  - budesonide-formoterol 160-4.5 mcg 2 puffs BID   - encourage incentive spirometry   ??????  #HFpEF  Grade I diastolic dysfunction per 01/2017 TTE, EF 70%. Evidence of pulmonary edema on CXR following MICU transfer. Appears on more euvolemic on exam today.   - IV furosemide 40 mg BID   - increase to hydralazine 50 mg PO TID   -???goal net > -500 cc (net neg -300 yesterday)  - daily weights (1 kg up yesterday)   - strict ins/outs   ???  #asymetric leg swelling (L)  Likely 2/2 past L ankle injury, also suspicion for DVT given hospitalization and immobility. [place] LE venous 12/17 - no evidence of DVT   - patient was previously on apixaban and clopidogrel   - current on heparin gtt   ???  #Afib  Rate-controlled  - on continuous cardiac monitoring   - carvedilol 25 mg???BID    - holding apixaban 5 mg BID for upcoming EBUS, on heparin gtt   ???  #Intrathoracic LAD  On clarification, there is no well defined pulmonary mass, rather, ''Coalescent airspace and nodular consolidation within the???left upper lobe and lingula,???and to a lesser extent, within the right upper and right lower lobes, consistent with pneumonia, likely fungal. Associated bulky intrathoracic multicompartmental lymphadenopathy'' on CT scan. There is some c/f malignancy given the LAD, am pulm is considering EBUS.  ???  #HTN  relatively hypotensive on transfer, though without signs of end-organ hypoperfusion, MAP maintained >70. Losartan and carvedilol held in MICU for hypotensive.   - restart losartan 100 mg daily in AM   ???  #DM2   Blood glucose at goal   - insulin sliding scale algorithm #3  - gabapentin 800 mg ???TID   - consider restarting home oral metformin upon discharge   ???  #PAD  s/p femoral-femoral graft 2009.  - holding clopidogrel 75 mg daily for EBUS, on heparin gtt (needs to be held for at least 5 days)  ???  #HLD chronic/stable  - pravastatin 40 mg nightly   ???  #GERD chronic/stable  - pantoprazole 40 mg daily   ??? #depression/ anxiety  Mood stable, no SI, no anxiety  - buspirone 15 mg BID  - duloxetine DR 60   - lorazepam 2 mg PRN BID  ???  #chronic pain. Localized to lower back and lower extremities  - norco 5-325 q6h PRN  - schedule outpatient pain clinic follow up   ???  #genital HSV (resolved)   Throughout admission patient was noted to have active flare. 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   - s/p acyclovir 400mg  po TID (12/10-12/16)    Inpatient Checklist  Diet: Diet 2 gram sodium  Diet carbohydrate controlled  DVT Prophylaxis: apixaban  GI Prophylaxis: PPI  Central Lines: None???  Tubes/Drains: None???  ???  Code Status:???  Full Code    Contact: ???Primary Emergency Contact: Chard,Johnnie, Home Phone: [phone]  ???  DWA Dr. Jackelyn Hoehn     Author:  Bascom Levels, MD  [phone]  02/14/2017 12:50 PM

## 2017-02-14 NOTE — Progress Notes
PULMONARY AND CRITICAL CARE CONSULT NOTE    Patient Name: Ryan Shepard  MRN: [id number]  DOB: 1940/12/06  PMD: Wilburn Mylar, MD  Date of Admission: 02/01/2017    Date of Service: 02/13/2017  Hospital Day: 40     Reason for Consultation: hypoxemic respiratory failure    Subjective:   Ryan Shepard is a 76 y.o. year old male with COPD (no PFT available, on tiotropium, budesonide-formoterol), prior tobacco use (>40 pack years, adamant that he quit ~ 20-25 years ago), PAD s/p femoral graft (2009, on plavix qD), HTN, HLD, DM2, pAfib (on apixaban) GERD, depression / anxiety, chronic pain, who presented with worsened dyspnea and sputum production on 12/6, concerning for a COPD exacerbation.  ???  Patient reportedly has had a 3L NC O2 requirement for the past 3 years, although only intermittently uses, but noticed saturations of~ 80% prior to admission.  Endorsed some recent lower extremity swelling, and orthopnea, and was recently started on diuretics.  Endorses two prior hospitalizations in the past year for presumed COPD exacerbations, including one admission to the ICU at an OSH in [place], but denies any history of intubation or NIPPV.  Admitted on 12/6 for presumed COPD exacerbation reported in the setting of ongoing tobacco use; CXR demonstrating confluent airspace opacities concerning for multifocal aspiration/pneumonia although procalcitonin was negative at that time..  Patient endorsed worsened dyspnea (exertional > rest), as well as increased productive cough with ''variable'' color sputum, including ''rust.''  Denies any frank hemoptysis, fevers, chills, night sweats, chest pain, palpitations; thinks he may have been losing weight although is uncertain.  ???  Denies any history of homelessness, incarceration or recent travel outside of the country.  Went to [place] in the summer, and [place] in the fall.  ???  Patient was treated with levofloxacin, pred 40 x 5 days, and duonebs.  On 12/11, patient was deemed ready for discharge with a stable 2-3L NC requirement (although per patient not back to his baseline); however, respiratory cultures from admission demonstrated candida and aspergillus species.  Patient underwent CT chest demonstrating: nodular consolidation within LUL and lingula, as well as RUL and RLL read as consistent with likely fungal infection.  In addition, patient found to have  bulky intrathoracic multicompartamental lymphadenopathy.   ID consultation recommended pulmonary evaluation for bronchoscopy with likely biopsy.  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  INTERVAL:  12/17: feels he is breathing easier, still on HFNC 40L/40% FIO2, BAL cx NGTD, cytology pending  12/18: BAL cytology negative, transitioned to hep gtt and plavix held. Remains on HFNC 40L 45%. CXR with improved right side, worsened left side.     Objective:   Vital Signs:  Temp:  [35.9 ???C (96.6 ???F)-37 ???C (98.6 ???F)] 36.4 ???C (97.5 ???F)  Heart Rate:  [72-93] 93  Resp:  [20-22] 20  BP: (123-170)/(55-80) 144/68  NBP Mean:  [75-107] 88  FiO2 (%):  [40 %-50 %] 40 %  SpO2:  [89 %-94 %] 91 %      Intake/Output Summary (Last 24 hours) at 02/13/17 2202  Last data filed at 02/13/17 2000   Gross per 24 hour   Intake             1225 ml   Output             1700 ml   Net             -475 ml  FiO2 (%):  [40 %-50 %] 40 %    Physical Exam:  Gen: Well-developed male in no acute distress  Eyes: PERRL, EOMI  ENT: OP clear, MMM  Neck: Supple, trachea midline, no cervical or supraclavicular lymphadenopathy  CV: RRR, normal S1S2, no m/r/g  Pulm: No wheezing, diffuse rhoncherous breath sounds bibasilar crackles  Abd: NABS, soft, flat, NTND  Skin: Warm and dry, no rashes  Ext: No cyanosis or edema. Distal pulses 2+ and symmetric  Neuro: A&Ox4, CNII-XII intact    Medications:  Scheduled:   ??? [START ON 02/14/2017] albuterol  2.5 mg Nebulization Q4H ??? budesonide-formoterol  2 puff Inhalation BID   ??? busPIRone  15 mg Oral BID   ??? carvedilol  12.5 mg Oral BID   ??? docusate  100 mg Oral BID   ??? DULoxetine  60 mg Oral Daily   ??? furosemide  40 mg Intravenous BID (diuretic)   ??? gabapentin  800 mg Oral TID   ??? hydrALAZINE  25 mg Oral TID   ??? ipratropium  0.5 mg Nebulization Q4H   ??? losartan  100 mg Oral Daily   ??? methylPREDNISolone IV  40 mg Intravenous Daily   ??? pantoprazole  40 mg Oral Daily   ??? piperacillin/tazobactam  3.375 g Intravenous Q8H   ??? polyethylene glycol  17 g Oral Daily   ??? pravastatin  40 mg Oral QHS   ??? pyridoxine  100 mg Oral Daily   ??? senna  1 tablet Oral QHS   ??? vancomycin  1.25 g Intravenous Q12H     Continuous Transfusions:   ??? heparin 25,000 units/250 mL drip RTU 1,300 Units/hr (02/13/17 1818)   ??? sodium chloride 5 mL/hr (02/13/17 1203)     PRN: insulin aspart **AND** dextrose, diphenhydrAMINE, HYDROcodone-acetaminophen, LORazepam    Labs:  CBC: WBC/Hgb/Hct/Plts:  5.90/12.5/39.0/197 (12/18 0503)  Chemistry:Na/K/Cl/CO2/BUN/Cr/glu:  138/4.6/96/34/17/0.85/135 (12/18 0503)  Coags: PT/INR/APTT/Fib:  --/--/53.5/-- (12/18 1459)  LFTS:    ABG:      POC Glucose:   Recent Labs      02/13/17   1707  02/13/17   1235  02/13/17   0919  02/12/17   2223   GLUCOSEPOC  234*  152*  111*  187*       Microbiology:   12/13 BAL:    AFB NTD    Bact NTD    Fungal: Candida    GS: negative    Biofire: negative  Aspergillus Fumigatus 02/05/17: <0.10  Cryptococcal Ag blood 02/06/17: negative  Bacterial Culture Respiratory 02/01/17: Few Probable candida albicans , Aspergillus [place]???   MRSA nares 02/01/17: No Methicillin-resistant Staphylococcus aureus isolated.   Sputum screen 02/01/17: <10 Epithelial cells per low power field   Resp bacterial Cx 02/01/17: <10 Epithelial cells per low power field   Legionella urine 02/01/17: negative     Imaging:  CXR 12/18:  Interval stability of left greater than right asymmetric interstitial alveolar opacities in favor of multifocal pneumonia superimposed on edema.  Stable appearance of small layering left effusion and small right effusion.  Stable heart and mediastinum. The thoracic aorta is mildly calcified, tortuous and ectatic.  No acute findings or significant changes elsewhere.    02/05/2017 CT Chest w/o contrast:  IMPRESSION:  Atherosclerotic calcification of the thoracic aorta, brachiocephalic, bilateral common carotid, bilateral subclavian and bilateral axillary, left anterior descending, circumflex and right coronary arteries. Enlargement of the central pulmonary arteries, the main pulmonary artery currently 39 mm in maximal axial diameter, with associated calcifications, consistent  with pulmonary arterial hypertension. Small left pleural effusion. Small pericardial effusion.??????Moderate smoking-related centrilobular emphysema.??????Coalescent airspace and nodular consolidation within the left upper lobe and lingula, and to a lesser extent, within the right upper and right lower lobes, consistent with pneumonia, likely fungal. Associated bulky intrathoracic multicompartmental lymphadenopathy; for example, AP window lymph node, currently 38 x 23 mm (3-40).    02/01/2017 TTE:  ???1. Technically difficult study due to poor acoustic windows.  ???2. Small LV size, moderate concentric left ventricular hypertrophy, hyperdynamic systolic function, mild LV diastolic dysfunction (grade I, impaired relaxation).  ???3. Left ventricular ejection fraction is approximately 70-75%.  ???4. Normal right ventricular size and normal systolic function.  ???5. There are no prior echocardiograms available for comparison.    Pathology:   BAL cytology: negative    Problem Based Assessment and Plan     Ryan Shepard???is a 76 y.o.???male???with history of COPD (on baseline 3L), grade I diastolic dysfunction, atrial fibrillation (on apixaban), HTN, DM2, PAD s/p femoral graft, GERD admitted to [facility]  12/6 for dyspnea with LUL consolidation; transferred to MICU 12/13 for worsening mixed hypoxemic/hypercapneic respiratory failure, now back to floor.  ???  # Mixed hypoxemic/hypercapneic respiratory failure- ddx includes V/Q mismatch vs shunt (has L effusion) vs diffusion impairment. Rec continued support with HFNC and pulm hygiene.  # Bulky Mediastinal lymphadenopathy- concerning for malignancy vs 2/2 infectious process. Given that cytology is negative, we recommend sampling with EBUS after plavix has been held for 5 days.  # Multifocal bilateral pneumonia with nodular changes- concerning for malignancy vs infection vs combined both. As above, EBUS after plavix is held for 5 days.    Summary of Recommendations:  - Please hold plavix (last dose on 02/12/17)  - recommend broadening abx to vanc+zosyn given persistent findings and continued sputum production  - recommend repeat sputum sample to be sent for bacterial pathogens  - Would not be unreasonable to try a short course of 40mg  solumedrol if primary team is concerning for COPD exacerbation  - Continue support with HFNC, titrate to Spo2 >88%  - Aggressive pulmonary hygiene: IS and aerobika q4h by RT  - Agree with gentle diuresis in case of HFpEF component to hypoxia  - F/U bronch studies  - Continue heparin gtt until EBUS procedure    Patient was discussed with attending, Dr. Ames Dura.    Newt Lukes, MD  Pulmonary and Critical Care Medicine Fellow  Pager [phone]

## 2017-02-14 NOTE — Other
Patients Clinical Goal:   Clinical Goal(s) for the Shift: vss, safety, heparin drip therapeutic  Identify possible barriers to advancing the care plan:   Stability of the patient: Moderately Unstable - medium risk of patient condition declining or worsening    End of Shift Summary: Pt alert and oriented x4. Norco given for pain with relief. No complaints of chest pain. Spo2>90% on 40% FiO2 40L HFNC. 1 BM overnight. Aptt 66.1 at 2216. Heparin drip increased to 1500 units/hr per protocol. Latest aptt 124.8. Hold for one hr per protocol. To be restarted at 0730 at 1300 units/hr.

## 2017-02-14 NOTE — Other
Patients Clinical Goal:   Clinical Goal(s) for the Shift: Afebrile, Start Heparin gtt, maintain safety, pain<2  Identify possible barriers to advancing the care plan: Weaning off highflow  Stability of the patient: Moderately Stable - low risk of patient condition declining or worsening   End of Shift Summary: BP 154/68  ~ Pulse 90  ~ Temp 36.7 C (98.1 F) (Oral)  ~ Resp 20  ~ Ht 1.854 m (6' 0.99'')  ~ Wt (!) 103.2 kg (227 lb 8.2 oz)  ~ SpO2 92%  ~ BMI 30.02 kg/m     Weaned high flow to 40L 40% FiO2 but did not tolerate and increased back to 50% 40L. Pt had BM. Walked with PT and OOB for meals. Had recurrent chest heaviness, workup neg. Lasix frequency increased to BID. Pain controlled with Norco

## 2017-02-14 NOTE — Progress Notes
PULMONARY AND CRITICAL CARE CONSULT NOTE    Patient Name: Ryan Shepard  MRN: [id number]  DOB: 04/21/1940  PMD: Wilburn Mylar, MD  Date of Admission: 02/01/2017    Date of Service: 02/14/2017  Hospital Day: 7     Reason for Consultation: hypoxemic respiratory failure    Subjective:   Ryan Shepard is a 76 y.o. year old male with COPD (no PFT available, on tiotropium, budesonide-formoterol), prior tobacco use (>40 pack years, adamant that he quit ~ 20-25 years ago), PAD s/p femoral graft (2009, on plavix qD), Hx of bladder/prostate malignancy (per patient, had surgery years ago), HTN, HLD, DM2, pAfib (on apixaban) GERD, depression / anxiety, chronic pain, who presented with worsened dyspnea and sputum production on 12/6, concerning for a COPD exacerbation.  ???  Patient reportedly has had a 3L NC O2 requirement for the past 3 years, although only intermittently uses, but noticed saturations of~ 80% prior to admission.  Endorsed some recent lower extremity swelling, and orthopnea, and was recently started on diuretics.  Endorses two prior hospitalizations in the past year for presumed COPD exacerbations, including one admission to the ICU at an OSH in [place], but denies any history of intubation or NIPPV.  Admitted on 12/6 for presumed COPD exacerbation reported in the setting of ongoing tobacco use; CXR demonstrating confluent airspace opacities concerning for multifocal aspiration/pneumonia although procalcitonin was negative at that time..  Patient endorsed worsened dyspnea (exertional > rest), as well as increased productive cough with ''variable'' color sputum, including ''rust.''  Denies any frank hemoptysis, fevers, chills, night sweats, chest pain, palpitations; thinks he may have been losing weight although is uncertain.  ???  Denies any history of homelessness, incarceration or recent travel outside of the country.  Went to [place] in the summer, and [place] in the fall.  ??? Patient was treated with levofloxacin, pred 40 x 5 days, and duonebs.  On 12/11, patient was deemed ready for discharge with a stable 2-3L NC requirement (although per patient not back to his baseline); however, respiratory cultures from admission demonstrated candida and aspergillus species.  Patient underwent CT chest demonstrating: nodular consolidation within LUL and lingula, as well as RUL and RLL read as consistent with likely fungal infection.  In addition, patient found to have  bulky intrathoracic multicompartamental lymphadenopathy.   ID consultation recommended pulmonary evaluation for bronchoscopy with likely biopsy.  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    INTERVAL:  12/17: feels he is breathing easier, still on HFNC 40L/40% FIO2, BAL cx NGTD, cytology pending  12/18: BAL cytology negative, transitioned to hep gtt and plavix held. Remains on HFNC 40L 45%. CXR with improved right side, worsened left side.   12/19: feels the same, remains on HFNC 40L/40%    Objective:   Vital Signs:  Temp:  [36.3 ???C (97.3 ???F)-37 ???C (98.6 ???F)] 36.3 ???C (97.3 ???F)  Heart Rate:  [74-93] 77  Resp:  [16-20] 16  BP: (119-170)/(55-70) 153/70  NBP Mean:  [75-98] 95  FiO2 (%):  [40 %-50 %] 40 %  SpO2:  [89 %-94 %] 93 %      Intake/Output Summary (Last 24 hours) at 02/14/17 0751  Last data filed at 02/13/17 2359   Gross per 24 hour   Intake             1191 ml   Output  1500 ml   Net             -309 ml       FiO2 (%):  [40 %-50 %] 40 %    Physical Exam:  Gen: Well-developed male in no acute distress  Eyes: PERRL, EOMI  ENT: OP clear, MMM  Neck: Supple, trachea midline, no cervical or supraclavicular lymphadenopathy  CV: RRR, normal S1S2, no m/r/g  Pulm: No wheezing, bibasilar crackles no further rhonchi  Abd: NABS, soft, flat, NTND  Skin: Warm and dry, no rashes  Ext: No cyanosis or edema. Distal pulses 2+ and symmetric  Neuro: A&Ox4, CNII-XII intact    Medications: Scheduled:   ??? albuterol  2.5 mg Nebulization Q4H   ??? budesonide-formoterol  2 puff Inhalation BID   ??? busPIRone  15 mg Oral BID   ??? carvedilol  12.5 mg Oral BID   ??? docusate  100 mg Oral BID   ??? DULoxetine  60 mg Oral Daily   ??? furosemide  40 mg Intravenous BID (diuretic)   ??? gabapentin  800 mg Oral TID   ??? hydrALAZINE  25 mg Oral TID   ??? ipratropium  0.5 mg Nebulization Q4H   ??? losartan  100 mg Oral Daily   ??? methylPREDNISolone IV  40 mg Intravenous Daily   ??? pantoprazole  40 mg Oral Daily   ??? piperacillin/tazobactam  3.375 g Intravenous Q8H   ??? polyethylene glycol  17 g Oral Daily   ??? pravastatin  40 mg Oral QHS   ??? pyridoxine  100 mg Oral Daily   ??? senna  1 tablet Oral QHS   ??? sodium chloride       ??? vancomycin  1.25 g Intravenous Q12H     Continuous Transfusions:   ??? heparin 25,000 units/250 mL drip RTU 1,300 Units/hr (02/14/17 0735)   ??? sodium chloride 5 mL/hr (02/13/17 1203)     PRN: insulin aspart **AND** dextrose, diphenhydrAMINE, HYDROcodone-acetaminophen, LORazepam    Labs:  CBC: WBC/Hgb/Hct/Plts:  6.33/12.1/37.5/240 (12/19 0528)  Chemistry:Na/K/Cl/CO2/BUN/Cr/glu:  134/5.0/93/29/19/1.00/170 (12/19 0528)  Coags: PT/INR/APTT/Fib:  --/--/124.8/-- (12/19 0528)  LFTS:    ABG:      POC Glucose:   Recent Labs      02/13/17   2209  02/13/17   1707  02/13/17   1235  02/13/17   0919   GLUCOSEPOC  271*  234*  152*  111*       Microbiology:   12/13 BAL:    AFB NTD    Bact NTD    Fungal: Candida    GS: negative    Biofire: negative  Aspergillus Fumigatus 02/05/17: <0.10  Cryptococcal Ag blood 02/06/17: negative  Bacterial Culture Respiratory 02/01/17: Few Probable candida albicans , Aspergillus [place]???   MRSA nares 02/01/17: No Methicillin-resistant Staphylococcus aureus isolated.   Sputum screen 02/01/17: <10 Epithelial cells per low power field   Resp bacterial Cx 02/01/17: <10 Epithelial cells per low power field   Legionella urine 02/01/17: negative     Imaging:  CXR 12/18: Interval stability of left greater than right asymmetric interstitial alveolar opacities in favor of multifocal pneumonia superimposed on edema.  Stable appearance of small layering left effusion and small right effusion.  Stable heart and mediastinum. The thoracic aorta is mildly calcified, tortuous and ectatic.  No acute findings or significant changes elsewhere.    02/05/2017 CT Chest w/o contrast:  IMPRESSION:  Atherosclerotic calcification of the thoracic aorta, brachiocephalic, bilateral common carotid, bilateral subclavian and bilateral  axillary, left anterior descending, circumflex and right coronary arteries. Enlargement of the central pulmonary arteries, the main pulmonary artery currently 39 mm in maximal axial diameter, with associated calcifications, consistent with pulmonary arterial hypertension. Small left pleural effusion. Small pericardial effusion.??????Moderate smoking-related centrilobular emphysema.??????Coalescent airspace and nodular consolidation within the left upper lobe and lingula, and to a lesser extent, within the right upper and right lower lobes, consistent with pneumonia, likely fungal. Associated bulky intrathoracic multicompartmental lymphadenopathy; for example, AP window lymph node, currently 38 x 23 mm (3-40).    02/01/2017 TTE:  ???1. Technically difficult study due to poor acoustic windows.  ???2. Small LV size, moderate concentric left ventricular hypertrophy, hyperdynamic systolic function, mild LV diastolic dysfunction (grade I, impaired relaxation).  ???3. Left ventricular ejection fraction is approximately 70-75%.  ???4. Normal right ventricular size and normal systolic function.  ???5. There are no prior echocardiograms available for comparison.    Pathology:   BAL cytology: negative    Problem Based Assessment and Plan     Ryan Shepard???is a 76 y.o.???male???with history of COPD (on baseline 3L), grade I diastolic dysfunction, atrial fibrillation (on apixaban), HTN, DM2, PAD s/p femoral graft, GERD admitted to [facility]  12/6 for dyspnea with LUL consolidation; transferred to MICU 12/13 for worsening mixed hypoxemic/hypercapneic respiratory failure, now back to floor.  ???  # Mixed hypoxemic/hypercapneic respiratory failure- ddx includes V/Q mismatch vs shunt (has L effusion) vs diffusion impairment. Rec continued support with HFNC and pulm hygiene.  # Bulky Mediastinal lymphadenopathy- concerning for malignancy vs 2/2 infectious process. Given that cytology is negative, we recommend sampling with EBUS after plavix has been held for 5 days.  # Multifocal bilateral pneumonia with nodular changes- concerning for malignancy vs infection vs combined both. As above, EBUS after plavix is held for 5 days.    Summary of Recommendations:  - Please hold plavix (last dose on 02/12/17)  - agree with broadening abx to vanc+zosyn given persistent findings and continued sputum production  - recommend repeat sputum sample to be sent for bacterial pathogens  - Would not be unreasonable to try a short course of 40mg  solumedrol 5d total  - Continue support with HFNC, titrate to Spo2 >88%  - Aggressive pulmonary hygiene: IS and aerobika q4h by RT  - Agree with gentle diuresis in case of HFpEF component to hypoxia  - F/U bronch studies  - Continue heparin gtt until possible EBUS procedure  - Consider obtaining staging CT A/P to ascertain if patient has other areas of LAD that could be biopsied    Patient was discussed with attending, Dr. Hale Drone.    Newt Lukes, MD  Pulmonary and Critical Care Medicine Fellow  Pager [phone]

## 2017-02-14 NOTE — Consults
Per med team, still pending biopsy of lymph node, on HFNC. Pending clearance.    1 accepting SNF- [facility].   Will discuss with pt.    Leslee Home CM, RN BSN  Medicine Team D  P: [phone]

## 2017-02-15 ENCOUNTER — Ambulatory Visit: Payer: PRIVATE HEALTH INSURANCE

## 2017-02-15 ENCOUNTER — Ambulatory Visit: Payer: MEDICARE

## 2017-02-15 ENCOUNTER — Ambulatory Visit: Payer: PRIVATE HEALTH INSURANCE | Attending: Pulmonary Disease

## 2017-02-15 LAB — Vancomycin,trough: VANCOMYCIN,TROUGH: 12.9 ug/mL (ref 10.0–20.0)

## 2017-02-15 LAB — Glucose,POC
GLUCOSE,POC: 195 mg/dL — ABNORMAL HIGH (ref 65–99)
GLUCOSE,POC: 274 mg/dL — ABNORMAL HIGH (ref 65–99)
GLUCOSE,POC: 159 mg/dL — ABNORMAL HIGH (ref 65–99)
GLUCOSE,POC: 286 mg/dL — ABNORMAL HIGH (ref 65–99)

## 2017-02-15 LAB — Basic Metabolic Panel
ANION GAP: 11 mg/dL (ref 8–19)
CREATININE: 0.91 mg/dL (ref 0.60–1.30)

## 2017-02-15 LAB — Fungal Culture Respiratory

## 2017-02-15 LAB — Differential Automated: LYMPHOCYTE PERCENT, AUTO: 11 (ref 0.00–0.10)

## 2017-02-15 LAB — Magnesium: MAGNESIUM: 1.7 meq/L (ref 1.4–1.9)

## 2017-02-15 LAB — Bacterial Culture Respiratory: BACTERIAL CULTURE RESPIRATORY: NORMAL

## 2017-02-15 LAB — APTT
APTT: 52.9 s — ABNORMAL HIGH (ref 24.4–36.2)
APTT: 95.1 s — ABNORMAL HIGH (ref 24.4–36.2)

## 2017-02-15 LAB — Blood Gases,venous: PCO2,VENOUS: 55 mmHg

## 2017-02-15 LAB — CBC: NEUTROPHILS ABS (PRELIM): 7.36 10*3/uL (ref 38.5–52.0)

## 2017-02-15 MED ADMIN — PIPERACILLIN-TAZOBACTAM 3.375 GM/50 ML RTU (EXT 4HR): 3.375 g | INTRAVENOUS | @ 06:00:00 | Stop: 2017-02-18 | NDC 00206886102

## 2017-02-15 MED ADMIN — INSULIN ASPART 100 UNIT/ML SC SOPN: 3 mL | SUBCUTANEOUS | @ 17:00:00 | Stop: 2017-02-22 | NDC 00169633910

## 2017-02-15 MED ADMIN — ALBUTEROL SULFATE (5 MG/ML) 0.5% IN NEBU: 2.5 mg | RESPIRATORY_TRACT | @ 13:00:00 | Stop: 2017-02-19 | NDC 00487990130

## 2017-02-15 MED ADMIN — IPRATROPIUM BROMIDE 0.02 % IN SOLN: 500 ug | RESPIRATORY_TRACT | @ 13:00:00 | Stop: 2017-02-19 | NDC 00487980101

## 2017-02-15 MED ADMIN — HYDRALAZINE HCL 50 MG PO TABS: 50 mg | ORAL | @ 04:00:00 | Stop: 2017-02-15 | NDC 00904644261

## 2017-02-15 MED ADMIN — SENNOSIDES 8.6 MG PO TABS: 1 | ORAL | @ 04:00:00 | Stop: 2017-02-21

## 2017-02-15 MED ADMIN — HYDROCODONE-ACETAMINOPHEN 5-325 MG PO TABS: 2 | ORAL | @ 23:00:00 | Stop: 2017-02-22 | NDC 00406012362

## 2017-02-15 MED ADMIN — LOSARTAN POTASSIUM 50 MG PO TABS: 100 mg | ORAL | @ 17:00:00 | Stop: 2017-02-22 | NDC 68084034701

## 2017-02-15 MED ADMIN — PIPERACILLIN-TAZOBACTAM 3.375 GM/50 ML RTU (EXT 4HR): 3.375 g | INTRAVENOUS | @ 14:00:00 | Stop: 2017-02-18 | NDC 00206886102

## 2017-02-15 MED ADMIN — DIPHENHYDRAMINE-ZINC ACETATE 1-0.1 % EX CREA: TOPICAL | @ 03:00:00 | Stop: 2017-02-22

## 2017-02-15 MED ADMIN — PANTOPRAZOLE SODIUM 40 MG PO TBEC: 40 mg | ORAL | @ 17:00:00 | Stop: 2017-02-22 | NDC 68084081309

## 2017-02-15 MED ADMIN — VANCOMYCIN 500 ML IVPB: 1.5 g | INTRAVENOUS | @ 17:00:00 | Stop: 2017-02-17 | NDC 47781059791

## 2017-02-15 MED ADMIN — BUSPIRONE HCL 5 MG PO TABS: 15 mg | ORAL | @ 04:00:00 | Stop: 2017-02-21 | NDC 16729020001

## 2017-02-15 MED ADMIN — IPRATROPIUM BROMIDE 0.02 % IN SOLN: 500 ug | RESPIRATORY_TRACT | @ 03:00:00 | Stop: 2017-02-19

## 2017-02-15 MED ADMIN — FUROSEMIDE 10 MG/ML IJ SOLN: 40 mg | INTRAVENOUS | @ 17:00:00 | Stop: 2017-02-15 | NDC 36000028325

## 2017-02-15 MED ADMIN — INSULIN ASPART 100 UNIT/ML SC SOPN: 3 mL | SUBCUTANEOUS | @ 20:00:00 | Stop: 2017-02-22 | NDC 00169633910

## 2017-02-15 MED ADMIN — ALBUTEROL SULFATE (5 MG/ML) 0.5% IN NEBU: 2.5 mg | RESPIRATORY_TRACT | @ 08:00:00 | Stop: 2017-02-19 | NDC 00487990130

## 2017-02-15 MED ADMIN — BUSPIRONE HCL 5 MG PO TABS: 15 mg | ORAL | @ 17:00:00 | Stop: 2017-02-21 | NDC 16729020001

## 2017-02-15 MED ADMIN — IPRATROPIUM BROMIDE 0.02 % IN SOLN: 500 ug | RESPIRATORY_TRACT | @ 22:00:00 | Stop: 2017-02-19 | NDC 00487980101

## 2017-02-15 MED ADMIN — BUDESONIDE-FORMOTEROL FUMARATE 160-4.5 MCG/ACT IN AERO: 2 | RESPIRATORY_TRACT | @ 04:00:00 | Stop: 2017-02-22 | NDC 00186037028

## 2017-02-15 MED ADMIN — DULOXETINE HCL 60 MG PO CPEP: 60 mg | ORAL | @ 17:00:00 | Stop: 2017-02-22 | NDC 00904645461

## 2017-02-15 MED ADMIN — GABAPENTIN 400 MG PO CAPS: 800 mg | ORAL | @ 14:00:00 | Stop: 2017-02-22 | NDC 68084077401

## 2017-02-15 MED ADMIN — PRAVASTATIN SODIUM 40 MG PO TABS: 40 mg | ORAL | @ 04:00:00 | Stop: 2017-02-22 | NDC 51079078220

## 2017-02-15 MED ADMIN — GABAPENTIN 400 MG PO CAPS: 800 mg | ORAL | @ 04:00:00 | Stop: 2017-02-22 | NDC 68084077401

## 2017-02-15 MED ADMIN — VANCOMYCIN 250 ML IVPB: 1.25 g | INTRAVENOUS | @ 06:00:00 | Stop: 2017-02-15 | NDC 47781059791

## 2017-02-15 MED ADMIN — ALBUTEROL SULFATE (5 MG/ML) 0.5% IN NEBU: 2.5 mg | RESPIRATORY_TRACT | @ 03:00:00 | Stop: 2017-02-19

## 2017-02-15 MED ADMIN — CARVEDILOL 12.5 MG PO TABS: 12.5 mg | ORAL | @ 04:00:00 | Stop: 2017-02-15 | NDC 68084086501

## 2017-02-15 MED ADMIN — HYDROCODONE-ACETAMINOPHEN 5-325 MG PO TABS: 2 | ORAL | @ 17:00:00 | Stop: 2017-02-22 | NDC 00406012362

## 2017-02-15 MED ADMIN — DOCUSATE SODIUM 100 MG PO CAPS: 100 mg | ORAL | @ 04:00:00 | Stop: 2017-02-22 | NDC 00904645561

## 2017-02-15 MED ADMIN — PIPERACILLIN-TAZOBACTAM 3.375 GM/50 ML RTU (EXT 4HR): 3.375 g | INTRAVENOUS | @ 22:00:00 | Stop: 2017-02-18 | NDC 00206886102

## 2017-02-15 MED ADMIN — IPRATROPIUM BROMIDE 0.02 % IN SOLN: 500 ug | RESPIRATORY_TRACT | @ 08:00:00 | Stop: 2017-02-19 | NDC 00487980101

## 2017-02-15 MED ADMIN — ALBUTEROL SULFATE (5 MG/ML) 0.5% IN NEBU: 2.5 mg | RESPIRATORY_TRACT | @ 18:00:00 | Stop: 2017-02-19 | NDC 00487990130

## 2017-02-15 MED ADMIN — CARVEDILOL 12.5 MG PO TABS: 12.5 mg | ORAL | @ 17:00:00 | Stop: 2017-02-15 | NDC 68084086501

## 2017-02-15 MED ADMIN — IPRATROPIUM BROMIDE 0.02 % IN SOLN: 500 ug | RESPIRATORY_TRACT | @ 18:00:00 | Stop: 2017-02-19 | NDC 00487980101

## 2017-02-15 MED ADMIN — HYDROCODONE-ACETAMINOPHEN 5-325 MG PO TABS: 2 | ORAL | @ 11:00:00 | Stop: 2017-02-22 | NDC 00406012362

## 2017-02-15 MED ADMIN — GABAPENTIN 400 MG PO CAPS: 800 mg | ORAL | @ 20:00:00 | Stop: 2017-02-22 | NDC 68084077401

## 2017-02-15 MED ADMIN — POLYETHYLENE GLYCOL 3350 17 G PO PACK: 17 g | ORAL | @ 17:00:00 | Stop: 2017-02-22 | NDC 68084043098

## 2017-02-15 MED ADMIN — BUDESONIDE-FORMOTEROL FUMARATE 160-4.5 MCG/ACT IN AERO: 2 | RESPIRATORY_TRACT | @ 18:00:00 | Stop: 2017-02-22 | NDC 00186037028

## 2017-02-15 MED ADMIN — PYRIDOXINE HCL 50 MG PO TABS: 100 mg | ORAL | @ 17:00:00 | Stop: 2017-02-22 | NDC 00536440801

## 2017-02-15 MED ADMIN — INSULIN ASPART 100 UNIT/ML SC SOPN: 3 mL | SUBCUTANEOUS | @ 04:00:00 | Stop: 2017-02-22 | NDC 00169633910

## 2017-02-15 MED ADMIN — DOCUSATE SODIUM 100 MG PO CAPS: 100 mg | ORAL | @ 17:00:00 | Stop: 2017-02-22 | NDC 00904645561

## 2017-02-15 MED ADMIN — ALBUTEROL SULFATE (5 MG/ML) 0.5% IN NEBU: 2.5 mg | RESPIRATORY_TRACT | @ 22:00:00 | Stop: 2017-02-19 | NDC 00487990130

## 2017-02-15 MED ADMIN — INSULIN ASPART 100 UNIT/ML SC SOPN: 3 mL | SUBCUTANEOUS | @ 06:00:00 | Stop: 2017-02-22 | NDC 00169633910

## 2017-02-15 MED ADMIN — HEPARIN 25,000 UNITS/250 ML 0.45% NACL RTU: 1400 [IU]/h | INTRAVENOUS | @ 06:00:00 | Stop: 2017-02-16 | NDC 00409765062

## 2017-02-15 MED ADMIN — HYDROCODONE-ACETAMINOPHEN 5-325 MG PO TABS: 2 | ORAL | @ 05:00:00 | Stop: 2017-02-22 | NDC 00406012362

## 2017-02-15 MED ADMIN — METHYLPREDNISOLONE SODIUM SUCC 40 MG IJ SOLR: 40 mg | INTRAVENOUS | @ 17:00:00 | Stop: 2017-02-18 | NDC 00009003928

## 2017-02-15 MED ADMIN — IPRATROPIUM BROMIDE 0.02 % IN SOLN: 500 ug | RESPIRATORY_TRACT | @ 04:00:00 | Stop: 2017-02-19 | NDC 00487980101

## 2017-02-15 MED ADMIN — HEPARIN 25,000 UNITS/250 ML 0.45% NACL RTU: 1400 [IU]/h | INTRAVENOUS | @ 22:00:00 | Stop: 2017-02-16 | NDC 00409765062

## 2017-02-15 MED ADMIN — ALBUTEROL SULFATE (5 MG/ML) 0.5% IN NEBU: 2.5 mg | RESPIRATORY_TRACT | @ 04:00:00 | Stop: 2017-02-19 | NDC 00487990130

## 2017-02-15 MED ADMIN — HYDRALAZINE HCL 50 MG PO TABS: 50 mg | ORAL | @ 14:00:00 | Stop: 2017-02-15 | NDC 00904644261

## 2017-02-15 NOTE — Other
Patients Clinical Goal:   Clinical Goal(s) for the Shift: vss, safety, heparin drip therapeutic  Identify possible barriers to advancing the care plan:   Stability of the patient: Moderately Stable - low risk of patient condition declining or worsening   End of Shift Summary: BP (!) 113/92  ~ Pulse 92  ~ Temp 36.5 ???C (97.7 ???F) (Oral)  ~ Resp 20  ~ Ht 1.854 m (6' 0.99'')  ~ Wt 100.6 kg (221 lb 12.8 oz)  ~ SpO2 92%  ~ BMI 29.27 kg/m???     - a/o x4 with vss, c/o generalized pain, relieved with prn pain med x2  - no sob, productive cough. HFNC weaned to 30%FiO2 30L with SpO2 >90%. Trialed regular nasal cannula, toleration 4L NC with no sob and SpO2 >94%  - oob to chair with meals, ambulate in hallway, treatment with OT  - last ptt 63.5, heparin gtt at 1500 units/hr, next ptt at 2100

## 2017-02-15 NOTE — Other
Patients Clinical Goal:   Clinical Goal(s) for the Shift: vss, safety, heparin therapeutic  Identify possible barriers to advancing the care plan:   Stability of the patient: Moderately Stable - low risk of patient condition declining or worsening   End of Shift Summary: Pt alert and oriented x4. Generalized pain managed with prn norco. No complaints of SOB on 3L NC. Heparin drip increased to 1700 units/hr at 2340 per protocol. AM aptt therapeutic. Rested without distress.

## 2017-02-15 NOTE — Interdisciplinary
Pharmaceutical Services ??? Vancomycin Dosing (Ongoing)    Patient Name: Ryan Shepard  MRN: [id number]  Age: 76 y.o.  Sex: male    Vancomycin per Pharmacy Consult Order     Start     Ordered    02/13/17 1021  vancomycin per pharmacy  Per Protocol     Question Answer Comment   Indication Pneumonia    Goal Trough Serum Level 15-20 mcg/mL    Has patient received a dose of this drug within the past 72 hours? No        02/13/17 1022        Vital Signs/Other Objective Data  Allergies: Patient has no known allergies.    BP 156/78  ~ Pulse 87  ~ Temp 36.8 ???C (98.2 ???F) (Axillary)  ~ Resp 20  ~ Ht 1.854 m (6' 0.99'')  ~ Wt 100.6 kg (221 lb 12.8 oz)  ~ SpO2 92%  ~ BMI 29.27 kg/m???     Intake/Output Summary (Last 24 hours) at 02/15/17 4540  Last data filed at 02/15/17 0400   Gross per 24 hour   Intake             1486 ml   Output             2775 ml   Net            -1289 ml       Medications  Med Administrations and Associated Flowsheet Values (last 72 hours)  Vancomycin administration    Date/Time Action Medication Dose Rate    02/14/17 2216 New Bag/ Syringe/ Cartridge    vancomycin 1.25 g in dextrose 5% 250 mL IVPB 1.25 g 250 mL/hr    02/14/17 1038 New Bag/ Syringe/ Cartridge    vancomycin 1.25 g in dextrose 5% 250 mL IVPB 1.25 g 250 mL/hr    02/13/17 2210 New Bag/ Syringe/ Cartridge    vancomycin 1.25 g in dextrose 5% 250 mL IVPB 1.25 g 250 mL/hr    02/13/17 1231 New Bag/ Syringe/ Cartridge    vancomycin 1.25 g in dextrose 5% 250 mL IVPB 1.25 g 250 mL/hr        Labs (most recent)  White Blood Cell Count   Date Value Ref Range Status   02/15/2017 9.54 4.16 - 9.95 x10E3/uL Final   01/25/2008 7.82 3.28 - 9.29 x10E3/uL      Urea Nitrogen   Date Value Ref Range Status   02/15/2017 23 (H) 7 - 22 mg/dL Final   98/12/9145 33 (H) 7 - 23 mg/dL Final     Creatinine   Date Value Ref Range Status   02/15/2017 0.91 0.60 - 1.30 mg/dL Final   82/95/6213 0.9 0.5 - 1.3 mg/dL      Vancomycin,trough (mcg/mL)   Date/Time Value 02/14/2017 2151 12.9       Assessment  Indication Pneumonia  Goal trough level 15-20 mcg/mL  This patient is receiving dialysis: No    Plan  Ryan Shepard is a 76 y.o. male who has been referred to pharmacy for vancomycin dosing. The indication for vancomycin is Pneumonia. Based on the measured vancomycin level, the dose will be slightly increased to:   Change vancomycin to 1500 mg IV q12h     Pharmacy will continue to monitor the patient's clinical progress. The next vancomycin trough is scheduled for 12/21 at 2100, before giving pm dose.    Baxter Flattery, PharmD, 02/15/2017, 7:18 AM

## 2017-02-15 NOTE — Consults
Discussed during IDR- pt remains on HFNC, updated [facility], d/c date TBD.     Leslee Home CM, RN BSN  Medicine Team D  P: [phone]

## 2017-02-15 NOTE — Progress Notes
MEDICINE ACCEPTANCE NOTE       DATE OF ADMISSION: 02/01/17  TRANSFER TO ICU: 02/08/17  TRANSFER TO MEDICINE: 02/15/2017   HOSPITAL DAY: 14     CHIEF COMPLAINT:   Chief Complaint   Patient presents with   ??? Shortness of Breath     dx with pneumonia at OSH on antibiotics with clear withe phelgm Denies fever.      Last 24 hours/overnight events    - no acute events overnight, vss  - weaned down to 4L NC around 1845 --> 3L NC at [place]  - patient feeling well overall, continues to cough, SOB improving, in good spirits   - patient mentioned he has a remote history of ''prostate and bladder cancer in 2000 s/p surgery and chemotherapy at [facility]''    Medications     Scheduled:    albuterol 2.5 mg Nebulization Q4H   budesonide-formoterol 2 puff Inhalation BID   busPIRone 15 mg Oral BID   carvedilol 12.5 mg Oral BID   docusate 100 mg Oral BID   DULoxetine 60 mg Oral Daily   furosemide 40 mg Intravenous BID (diuretic)   gabapentin 800 mg Oral TID   hydrALAZINE 50 mg Oral TID   ipratropium 0.5 mg Nebulization Q4H   losartan 100 mg Oral Daily   methylPREDNISolone IV 40 mg Intravenous Daily   pantoprazole 40 mg Oral Daily   piperacillin/tazobactam 3.375 g Intravenous Q8H   polyethylene glycol 17 g Oral Daily   pravastatin 40 mg Oral QHS   pyridoxine 100 mg Oral Daily   senna 1 tablet Oral QHS   sodium chloride      vancomycin 1.5 g Intravenous Q12H      Continuous:  ??? heparin 25,000 units/250 mL drip RTU 1,700 Units/hr (02/14/17 2339)   ??? sodium chloride 5 mL/hr (02/13/17 1203)      PRN:  insulin aspart **AND** dextrose, diphenhydrAMINE, HYDROcodone-acetaminophen, LORazepam      Physical Exam   Temp:  [36.5 ???C (97.7 ???F)-37 ???C (98.6 ???F)] 36.8 ???C (98.2 ???F)  Heart Rate:  [86-96] 87  Resp:  [20] 20  BP: (113-162)/(76-92) 156/78  NBP Mean:  [98-103] 103  FiO2 (%):  [30 %-40 %] 30 %  SpO2:  [90 %-95 %] 92 %   UOP: Urine:  [300 mL-800 mL] 300 mL I/Os:     Weights:   100.6 kg (02/14/17)  103.2 kg (02/13/17)  102.1 kg (02/12/17) 102.9 kg (02/11/17)  103.4 kg (02/01/17)      Intake/Output Summary (Last 24 hours) at 02/15/17 0745  Last data filed at 02/15/17 0535   Gross per 24 hour   Intake             1536 ml   Output             2775 ml   Net            -1239 ml     Gen: 76 yo [name], pleasant, in no acute distress, appears comfortable, sitting up in bed, NC in plac  CV: RRR, no murmurs   CV: improved air movement, crackles bilaterally,normal work of breathing  Abd: NABS, soft, flat, NT, no masses or organomegaly  Skin: Warm and dry, no rashes  Ext: improvement in lower extremity edema bilaterally   Neuro: A&Ox4, CNII-XII intact, motor 5/5 throughout, sensation grossly intact    Laboratory Data   CBC:  Recent Labs      02/15/17   0522  02/14/17  4540  02/13/17   0503   WBC  9.54  6.33  5.90   NEUTABS  7.36*  4.93  3.38   HGB  12.5*  12.1*  12.5*   HCT  38.4*  37.5*  39.0   MCV  89.3  88.2  90.3   PLT  283  240  197     BMP:  Recent Labs      02/15/17   0522  02/14/17   0528  02/13/17   0503   NA  134*  134*  138   K  4.8  5.0  4.6   CL  94*  93*  96   CO2  29  29  34*   BUN  23*  19  17   CREAT  0.91  1.00  0.85   GLUCOSE  215*  170*  135*   CALCIUM  8.6  8.4*  8.9   MG  1.7  1.7  1.9     VBG: PH 7.39, pH 55, HCO3 32.6    Microbiology   Resp cx (bacterial) 02/13/17: prelim - normal respiratory tract flora isolated   Resp cx (fungal) 02/13/17: candida   12/13 BAL:    AFB NTD    Bact NTD    Fungal: Candida    GS: negative    Biofire: negative  Aspergillus Fumigatus 02/05/17: <0.10  Cryptococcal Ag blood 02/06/17: negative  Bacterial Culture Respiratory 02/01/17: Few Probable candida albicans , Aspergillus [place]???   MRSA nares 02/01/17: No Methicillin-resistant Staphylococcus aureus isolated.   Sputum screen 02/01/17: <10 Epithelial cells per low power field   Resp bacterial Cx 02/01/17: <10 Epithelial cells per low power field   Legionella urine 02/01/17: negative     Pathology   Respiratory cytology 02/08/17  FINAL DIAGNOSIS Negative for malignant cells    Imaging   No new     Assessment/Plan   Ryan Shepard???is a 76 y.o.???male???with history of COPD (on baseline 3L), grade I diastolic dysfunction, atrial fibrillation (on apixaban),???HTN, DM2, PAD s/p femoral graft, GERD admitted to [facility] ???12/6 for dyspnea with LUL consolidation; transferred to MICU 12/13 for worsening mixed hypoxemic/hypercapneic respiratory failure.  ???  #Mixed hypoxemic/hypercapneic respiratory failure (improving)   ???Acute hypoxemia with increasing O2 requirement and hypercapnia following bronchoscopy. Highest concern for post-procedural inflammation/bronchoconstriction, however differential also includes acute bacterial pneumonia including aspiration in setting of sedation. Initial concern for ???pulmonary aspergillosis given???positive sputum???cultures on admission, however BAL cultures remain negative (+candida only, likely colonizer) with negative galactomanan less suggestive of pathogenic infection. Fungal infection and malignancy remain on differential for chronic respiratory symptoms with multifocal consolidations and intrathoracic lymphadenopathy. s/p vanc (12/14), pip-tazo (12/14-12/15), s/p voriconazole (12/14-16); held per ID recs given BAL cultures NTD, s/p IV Ceftriaxone and metronidazole (12/15-20)  - Pulm consulted, appreciate rec's:  - on 3L NC,  for SpO2 >88% (on home NC 3L)  - continue IV Vancomycin (per pharmacy) and IV piperacillin/tazobactam 3.375 g q8h (12/18-  - continue IV methylprednisolone 40 mg daily x 5 days for  COPD exacerbation/airway inflammation post bronch (12/18-  - follow repeat sputum cultures from 02/13/18, NGTD  - respiratory cytology 12/13 negative for malignant cells  - pulm planning for EBUS once patient off clopidogrel for at least 5 days --> however if CT A/P shows LAD that is easier to biopsy will pursue that route  - diurese for euvolemia or -500   - ???bipap at night, daily VBG   -???scheduled albuterol/ipratropium nebs???q4h - budesonide-formoterol 160-4.5  mcg 2 puffs BID   - encourage incentive spirometry   ??????  #HFpEF  Grade I diastolic dysfunction per 01/2017 TTE, EF 70%. Evidence of pulmonary edema on CXR following MICU transfer. Appears on more euvolemic on exam today.   - IV furosemide 40 mg BID (consider holding for AKI)   - d/c hydralazine 50 mg PO TID   -???goal net > -500 cc (net neg -300 yesterday)  - daily weights (1 kg up yesterday)   - strict ins/outs     #AKI (resolving)   Cr down to 0.91 today, likely 2/2 increase in diuresis  ???  #Afib  Rate-controlled  - on continuous cardiac monitoring   - carvedilol 12.5 mg mg???BID    - holding apixaban 5 mg BID for upcoming EBUS, on heparin gtt   ???  #Intrathoracic LAD  On clarification, there is no well defined pulmonary mass, rather, ''Coalescent airspace and nodular consolidation within the???left upper lobe and lingula,???and to a lesser extent, within the right upper and right lower lobes, consistent with pneumonia, likely fungal. Associated bulky intrathoracic multicompartmental lymphadenopathy'' on CT scan. There is some c/f malignancy given the LAD, am pulm is considering EBUS, patient reports remote history of prostate cancer s/p surgery and chemotherapy around 2000.   - requested records from [facility]  - ordered CT A/P with contrast to evaluate for malignancy and LAD for biopsy to avoid EBUS if possible     #HTN  SBP 150s, on methylprednisolone and scheduled duonebs that may be contributing to HTN, d/c hydralazine 50 mg TID which showed no improvement in BP  - losartan 100 mg daily   - if BP elevated tonight consider increase carvedilol to 25 mg BID  ???  #DM2   Blood glucose at goal   - insulin sliding scale algorithm #3  - gabapentin 800 mg ???TID   - consider restarting home oral metformin upon discharge   ???  #PAD  s/p femoral-femoral graft 2009.  - holding clopidogrel 75 mg daily for EBUS, on heparin gtt (needs to be held for at least 5 days)  ???  #HLD chronic/stable - pravastatin 40 mg nightly   ???  #GERD chronic/stable  - pantoprazole 40 mg daily   ???  #depression/ anxiety  Mood stable, no SI, no anxiety  - buspirone 15 mg BID  - duloxetine DR 60   - lorazepam 2 mg PRN BID  ???  #chronic pain. Localized to lower back and lower extremities  - norco 5-325 q6h PRN  - schedule outpatient pain clinic follow up   ???  #genital HSV (resolved)   Throughout admission patient was noted to have active flare. 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   - s/p acyclovir 400mg  po TID (12/10-12/16)    Inpatient Checklist  Diet: Diet 2 gram sodium  Diet carbohydrate controlled  DVT Prophylaxis: apixaban  GI Prophylaxis: PPI  Central Lines: None???  Tubes/Drains: None???  ???  Code Status:???  Full Code    Contact: ???Primary Emergency Contact: Shepard,Ryan, Home Phone: [phone]  ???  DWA Dr. Jackelyn Hoehn     Author:  Bascom Levels, MD  [phone]  02/15/2017 7:45 AM    Attending Addendum:  Date of Service: 02/15/2017    I saw and evaluated Ryan Shepard. I discussed the case with the resident and agree with the findings and plan of care as documented in the resident's note above. I personally reviewed the interval physician notes, nursing notes, and allied health professional notes,  imaging, labs and microbiologic information over the last 24 hours.    Greater than 50% of a 35 minute encounter was spent on direct patient care activities, counseling of the patient and/or family, and coordination of care for the problems discussed in my note, as well as: discussion of care plan and management options, discussion with other treating/consulting physicians.    Talyia Allende L. Jackelyn Hoehn, MD  02/15/2017 4:59 PM  Clinical Instructor, [facility] Internal Medicine

## 2017-02-16 ENCOUNTER — Telehealth: Payer: MEDICARE

## 2017-02-16 ENCOUNTER — Telehealth: Payer: Commercial Managed Care - HMO

## 2017-02-16 DIAGNOSIS — J90 Pleural effusion, not elsewhere classified: Secondary | ICD-10-CM

## 2017-02-16 LAB — Basic Metabolic Panel: POTASSIUM: 4.5 mmol/L (ref 3.6–5.3)

## 2017-02-16 LAB — Lactate Dehydrogenase: LACTATE DEHYDROGENASE: 376 U/L — ABNORMAL HIGH (ref 125–256)

## 2017-02-16 LAB — APTT: APTT: 86.7 s — ABNORMAL HIGH (ref 24.4–36.2)

## 2017-02-16 LAB — SPECIFIC IgE AND IgG ANTIBODIES TO ASPERGILLUS FUMIGATUS

## 2017-02-16 LAB — Total Protein: TOTAL PROTEIN: 5.9 g/dL — ABNORMAL LOW (ref 6.1–8.2)

## 2017-02-16 LAB — Differential Automated: ABSOLUTE EOS COUNT: 0.06 10*3/uL (ref 0.00–0.50)

## 2017-02-16 LAB — Glucose,POC
GLUCOSE,POC: 216 mg/dL — ABNORMAL HIGH (ref 65–99)
GLUCOSE,POC: 310 mg/dL — ABNORMAL HIGH (ref 65–99)
GLUCOSE,POC: 370 mg/dL — ABNORMAL HIGH (ref 65–99)

## 2017-02-16 LAB — Blood Gases,venous

## 2017-02-16 LAB — CBC: PLATELET COUNT, AUTO: 297 10*3/uL (ref 143–398)

## 2017-02-16 LAB — Magnesium: MAGNESIUM: 1.7 meq/L (ref 1.4–1.9)

## 2017-02-16 MED ORDER — FLUTICASONE-UMECLIDIN-VILANT 100-62.5-25 MCG/INH IN AEPB
1 | Freq: Every day | RESPIRATORY_TRACT | 0 refills | 56.00 days | Status: AC
Start: 2017-02-16 — End: ?

## 2017-02-16 MED ADMIN — BUSPIRONE HCL 5 MG PO TABS: 15 mg | ORAL | @ 05:00:00 | Stop: 2017-02-21 | NDC 16729020001

## 2017-02-16 MED ADMIN — SENNOSIDES 8.6 MG PO TABS: 1 | ORAL | @ 05:00:00 | Stop: 2017-02-21

## 2017-02-16 MED ADMIN — IPRATROPIUM BROMIDE 0.02 % IN SOLN: 500 ug | RESPIRATORY_TRACT | @ 08:00:00 | Stop: 2017-02-19

## 2017-02-16 MED ADMIN — BUDESONIDE-FORMOTEROL FUMARATE 160-4.5 MCG/ACT IN AERO: 2 | RESPIRATORY_TRACT | @ 16:00:00 | Stop: 2017-02-22 | NDC 00186037028

## 2017-02-16 MED ADMIN — PIPERACILLIN-TAZOBACTAM 3.375 GM/50 ML RTU (EXT 4HR): 3.375 g | INTRAVENOUS | @ 06:00:00 | Stop: 2017-02-18 | NDC 00206886102

## 2017-02-16 MED ADMIN — ALBUTEROL SULFATE (5 MG/ML) 0.5% IN NEBU: 2.5 mg | RESPIRATORY_TRACT | @ 05:00:00 | Stop: 2017-02-19 | NDC 00487990130

## 2017-02-16 MED ADMIN — IPRATROPIUM BROMIDE 0.02 % IN SOLN: 500 ug | RESPIRATORY_TRACT | @ 05:00:00 | Stop: 2017-02-19 | NDC 00487980101

## 2017-02-16 MED ADMIN — IPRATROPIUM BROMIDE 0.02 % IN SOLN: 500 ug | RESPIRATORY_TRACT | @ 20:00:00 | Stop: 2017-02-19 | NDC 00487980101

## 2017-02-16 MED ADMIN — ALBUTEROL SULFATE (5 MG/ML) 0.5% IN NEBU: 2.5 mg | RESPIRATORY_TRACT | @ 16:00:00 | Stop: 2017-02-19 | NDC 00487990130

## 2017-02-16 MED ADMIN — GABAPENTIN 400 MG PO CAPS: 800 mg | ORAL | @ 19:00:00 | Stop: 2017-02-22 | NDC 68084077401

## 2017-02-16 MED ADMIN — INSULIN ASPART 100 UNIT/ML SC SOPN: 3 mL | SUBCUTANEOUS | @ 17:00:00 | Stop: 2017-02-22 | NDC 00169633910

## 2017-02-16 MED ADMIN — CARVEDILOL 25 MG PO TABS: 25 mg | ORAL | @ 05:00:00 | Stop: 2017-02-22 | NDC 00904630361

## 2017-02-16 MED ADMIN — VANCOMYCIN 500 ML IVPB: 1.5 g | INTRAVENOUS | @ 17:00:00 | Stop: 2017-02-17 | NDC 47781059791

## 2017-02-16 MED ADMIN — IPRATROPIUM BROMIDE 0.02 % IN SOLN: 500 ug | RESPIRATORY_TRACT | @ 01:00:00 | Stop: 2017-02-19 | NDC 00487980101

## 2017-02-16 MED ADMIN — GABAPENTIN 400 MG PO CAPS: 800 mg | ORAL | @ 05:00:00 | Stop: 2017-02-22 | NDC 68084077401

## 2017-02-16 MED ADMIN — PRAVASTATIN SODIUM 40 MG PO TABS: 40 mg | ORAL | @ 05:00:00 | Stop: 2017-02-22 | NDC 51079078220

## 2017-02-16 MED ADMIN — ALBUTEROL SULFATE (5 MG/ML) 0.5% IN NEBU: 2.5 mg | RESPIRATORY_TRACT | @ 20:00:00 | Stop: 2017-02-19 | NDC 00487990130

## 2017-02-16 MED ADMIN — DOCUSATE SODIUM 100 MG PO CAPS: 100 mg | ORAL | @ 05:00:00 | Stop: 2017-02-22 | NDC 00904645561

## 2017-02-16 MED ADMIN — PIPERACILLIN-TAZOBACTAM 3.375 GM/50 ML RTU (EXT 4HR): 3.375 g | INTRAVENOUS | @ 22:00:00 | Stop: 2017-02-18 | NDC 00206886102

## 2017-02-16 MED ADMIN — ALBUTEROL SULFATE (5 MG/ML) 0.5% IN NEBU: 2.5 mg | RESPIRATORY_TRACT | @ 08:00:00 | Stop: 2017-02-19

## 2017-02-16 MED ADMIN — BUDESONIDE-FORMOTEROL FUMARATE 160-4.5 MCG/ACT IN AERO: 2 | RESPIRATORY_TRACT | @ 05:00:00 | Stop: 2017-02-22 | NDC 00186037028

## 2017-02-16 MED ADMIN — BUSPIRONE HCL 5 MG PO TABS: 15 mg | ORAL | @ 17:00:00 | Stop: 2017-02-21 | NDC 16729020001

## 2017-02-16 MED ADMIN — HYDROCODONE-ACETAMINOPHEN 5-325 MG PO TABS: 2 | ORAL | @ 11:00:00 | Stop: 2017-02-22 | NDC 00406012362

## 2017-02-16 MED ADMIN — ALBUTEROL SULFATE (5 MG/ML) 0.5% IN NEBU: 2.5 mg | RESPIRATORY_TRACT | @ 01:00:00 | Stop: 2017-02-19 | NDC 00487990130

## 2017-02-16 MED ADMIN — CARVEDILOL 25 MG PO TABS: 25 mg | ORAL | @ 17:00:00 | Stop: 2017-02-22 | NDC 00904630361

## 2017-02-16 MED ADMIN — DULOXETINE HCL 60 MG PO CPEP: 60 mg | ORAL | @ 17:00:00 | Stop: 2017-02-22 | NDC 00904645461

## 2017-02-16 MED ADMIN — HEPARIN 25,000 UNITS/250 ML 0.45% NACL RTU: 1400 [IU]/h | INTRAVENOUS | @ 11:00:00 | Stop: 2017-02-16 | NDC 00409765062

## 2017-02-16 MED ADMIN — ALBUTEROL SULFATE (5 MG/ML) 0.5% IN NEBU: 2.5 mg | RESPIRATORY_TRACT | @ 12:00:00 | Stop: 2017-02-19 | NDC 00487990130

## 2017-02-16 MED ADMIN — INSULIN ASPART 100 UNIT/ML SC SOPN: 3 mL | SUBCUTANEOUS | @ 06:00:00 | Stop: 2017-02-22 | NDC 00169633910

## 2017-02-16 MED ADMIN — HYDROCODONE-ACETAMINOPHEN 5-325 MG PO TABS: 2 | ORAL | @ 19:00:00 | Stop: 2017-02-22 | NDC 00406012362

## 2017-02-16 MED ADMIN — HYDROCODONE-ACETAMINOPHEN 5-325 MG PO TABS: 2 | ORAL | @ 05:00:00 | Stop: 2017-02-22 | NDC 00406012362

## 2017-02-16 MED ADMIN — INSULIN ASPART 100 UNIT/ML SC SOPN: 3 mL | SUBCUTANEOUS | @ 20:00:00 | Stop: 2017-02-22 | NDC 00169633910

## 2017-02-16 MED ADMIN — INSULIN ASPART 100 UNIT/ML SC SOPN: 3 mL | SUBCUTANEOUS | @ 02:00:00 | Stop: 2017-02-22 | NDC 00169633910

## 2017-02-16 MED ADMIN — LORAZEPAM 1 MG PO TABS: 2 mg | ORAL | @ 11:00:00 | Stop: 2017-02-22 | NDC 69315090505

## 2017-02-16 MED ADMIN — DOCUSATE SODIUM 100 MG PO CAPS: 100 mg | ORAL | @ 17:00:00 | Stop: 2017-02-22

## 2017-02-16 MED ADMIN — PANTOPRAZOLE SODIUM 40 MG PO TBEC: 40 mg | ORAL | @ 17:00:00 | Stop: 2017-02-22 | NDC 68084081309

## 2017-02-16 MED ADMIN — VANCOMYCIN 500 ML IVPB: 1.5 g | INTRAVENOUS | @ 05:00:00 | Stop: 2017-02-17 | NDC 47781059791

## 2017-02-16 MED ADMIN — PYRIDOXINE HCL 50 MG PO TABS: 100 mg | ORAL | @ 17:00:00 | Stop: 2017-02-22 | NDC 00536440801

## 2017-02-16 MED ADMIN — IPRATROPIUM BROMIDE 0.02 % IN SOLN: 500 ug | RESPIRATORY_TRACT | @ 12:00:00 | Stop: 2017-02-19 | NDC 00487980101

## 2017-02-16 MED ADMIN — IPRATROPIUM BROMIDE 0.02 % IN SOLN: 500 ug | RESPIRATORY_TRACT | @ 16:00:00 | Stop: 2017-02-19 | NDC 00487980101

## 2017-02-16 MED ADMIN — LOSARTAN POTASSIUM 50 MG PO TABS: 100 mg | ORAL | @ 17:00:00 | Stop: 2017-02-22 | NDC 68084034701

## 2017-02-16 MED ADMIN — GABAPENTIN 400 MG PO CAPS: 800 mg | ORAL | @ 14:00:00 | Stop: 2017-02-22 | NDC 68084077401

## 2017-02-16 MED ADMIN — IOHEXOL 350 MG/ML IV SOLN: 125 mL | INTRAVENOUS | @ 02:00:00 | Stop: 2017-02-16 | NDC 00407141476

## 2017-02-16 MED ADMIN — METHYLPREDNISOLONE SODIUM SUCC 40 MG IJ SOLR: 40 mg | INTRAVENOUS | @ 17:00:00 | Stop: 2017-02-18 | NDC 00009003928

## 2017-02-16 MED ADMIN — PIPERACILLIN-TAZOBACTAM 3.375 GM/50 ML RTU (EXT 4HR): 3.375 g | INTRAVENOUS | @ 14:00:00 | Stop: 2017-02-18 | NDC 00206886102

## 2017-02-16 MED ADMIN — POLYETHYLENE GLYCOL 3350 17 G PO PACK: 17 g | ORAL | @ 17:00:00 | Stop: 2017-02-22

## 2017-02-16 NOTE — Procedures
PATIENT:  Ryan Shepard  MRN:  [id number]  DOB:  June 01, 1940  DATE OF PROCEDURE:  02/16/2017    TIME OF PROCEDURE:  2:39 PM    Procedure: Limited Bedside Ultrasound    Procedure:     INDICATIONS:   thoracentesis    PROCEDURE PHYSICIAN: Sinda Du, MD   ATTENDING PHYSICIAN:   Titus Dubin., MD    Ultrasound(s) performed and associated findings:         Image 1: Left pleural space      Insufficient pocket of pleural fluid for thoracentesis to be safely performed    Electronically signed:  Sinda Du, MD  Clinical Instructor of Medicine  Division of Pulmonary & [place] of Medicine at [facility]  Email: [email or web address]  Pager: [phone], [place]  Office: [phone]  Fax:  [phone]  02/16/2017 2:40 PM

## 2017-02-16 NOTE — Consults
SPRITUAL CARE CONSULTATION NOTE    PATIENT:  Ryan Shepard  MRN:  [id number]     Patient Info        Religious/Spiritual Identity:        Ryan Shepard       Last Anointed Date:                 Baptised:                 Spiritual Care Visit Details              Date of Visit:  02/15/17  Time of Visit:  1623  Visited with Patient   Visit length 5 Minutes   Referral source Self-initiated   Reason for visit Initial visit/assessment      Spiritual Assessment     Spiritual practices & resources Prayer   Areas of spiritual/emotional distress Adjustment to illness/hospitalization, Concerns for health and healing, Feelings of ...   Distressful feelings Feelings of doubt/uncertainty   Indicators of spiritual wellbeing Able to receive love and support, Takes initiative in own spiritual life, Expresses...   Expressions of spiritual wellbeing Expresses desire to get well, Expresses hope      Plan     Spiritual care intervention Introduction to chaplain services, Offered words of comfort/encouragement   Outcomes (per patient/family) Appreciated visit   Spiritual care plans None at this time (Specify reason) (None at this time)   Additional comments N/A      Recommendation           Author:  [facility] 02/15/2017 4:30 PM  Contact info: [place] pager: [place] ext: [phone]

## 2017-02-16 NOTE — Progress Notes
MEDICINE ACCEPTANCE NOTE       DATE OF ADMISSION: 02/01/17  TRANSFER TO ICU: 02/08/17  TRANSFER TO MEDICINE: 02/16/2017   HOSPITAL DAY: 15     CHIEF COMPLAINT:   Chief Complaint   Patient presents with   ??? Shortness of Breath     dx with pneumonia at OSH on antibiotics with clear withe phelgm Denies fever.      Last 24 hours/overnight events    - no acute events overnight, vss  - oxygen saturation adequate on 3L NC   - BP continues to be elevated in 150-160s     Medications     Scheduled:    albuterol 2.5 mg Nebulization Q4H   budesonide-formoterol 2 puff Inhalation BID   busPIRone 15 mg Oral BID   carvedilol 25 mg Oral BID   docusate 100 mg Oral BID   DULoxetine 60 mg Oral Daily   gabapentin 800 mg Oral TID   ipratropium 0.5 mg Nebulization Q4H   losartan 100 mg Oral Daily   methylPREDNISolone IV 40 mg Intravenous Daily   pantoprazole 40 mg Oral Daily   piperacillin/tazobactam 3.375 g Intravenous Q8H   polyethylene glycol 17 g Oral Daily   pravastatin 40 mg Oral QHS   pyridoxine 100 mg Oral Daily   senna 1 tablet Oral QHS   vancomycin 1.5 g Intravenous Q12H      Continuous:  ??? heparin 25,000 units/250 mL drip RTU 1,700 Units/hr (02/16/17 0233)   ??? sodium chloride 5 mL/hr (02/13/17 1203)      PRN:  insulin aspart **AND** dextrose, diphenhydrAMINE, HYDROcodone-acetaminophen, LORazepam      Physical Exam   Temp:  [36.1 ???C (97 ???F)-36.6 ???C (97.9 ???F)] 36.1 ???C (97 ???F)  Heart Rate:  [80-105] 80  Resp:  [16-22] 16  BP: (157-167)/(69-86) 167/82  NBP Mean:  [94-110] 105  SpO2:  [90 %-96 %] 96 %   UOP: Urine:  [500 mL-800 mL] 675 mL I/Os:     Weights:   100.6 kg (02/14/17)  103.2 kg (02/13/17)  102.1 kg (02/12/17)  102.9 kg (02/11/17)  103.4 kg (02/01/17)      Intake/Output Summary (Last 24 hours) at 02/16/17 1610  Last data filed at 02/16/17 0605   Gross per 24 hour   Intake             2345 ml   Output             3925 ml   Net            -1580 ml     Gen: 76 yo [name], pleasant, in no acute distress, appears comfortable, sitting up in bed, NC in plac  CV: RRR, no murmurs   CV: improved air movement, crackles bilaterally,normal work of breathing  Abd: NABS, soft, flat, NT, no masses or organomegaly  Skin: Warm and dry, no rashes  Ext: improvement in lower extremity edema bilaterally   Neuro: A&Ox4, CNII-XII intact, motor 5/5 throughout, sensation grossly intact    Laboratory Data   CBC:  Recent Labs      02/16/17   0650  02/15/17   0522  02/14/17   0528   WBC  10.78*  9.54  6.33   NEUTABS   --   7.36*  4.93   HGB  12.8*  12.5*  12.1*   HCT  40.2  38.4*  37.5*   MCV  88.2  89.3  88.2   PLT  297  283  240     BMP:  Recent Labs      02/15/17   0522  02/14/17   0528   NA  134*  134*   K  4.8  5.0   CL  94*  93*   CO2  29  29   BUN  23*  19   CREAT  0.91  1.00   GLUCOSE  215*  170*   CALCIUM  8.6  8.4*   MG  1.7  1.7       02/16/2017 06:51  pH: 7.39  pCO2: 51  pO2: 47  Bicarbonate: 30.3  Base Excess: 5  O2 Sat/Measured: 79.6  Inspired O2: 3L      Microbiology   Resp cx (bacterial) 02/13/17: prelim - normal respiratory tract flora isolated   Resp cx (fungal) 02/13/17: candida   12/13 BAL:    AFB NTD    Bact NTD    Fungal: Candida    GS: negative    Biofire: negative  Aspergillus Fumigatus 02/05/17: <0.10  Cryptococcal Ag blood 02/06/17: negative  Bacterial Culture Respiratory 02/01/17: Few Probable candida albicans , Aspergillus [place]???   MRSA nares 02/01/17: No Methicillin-resistant Staphylococcus aureus isolated.   Sputum screen 02/01/17: <10 Epithelial cells per low power field   Resp bacterial Cx 02/01/17: <10 Epithelial cells per low power field   Legionella urine 02/01/17: negative     Pathology   Respiratory cytology 02/08/17  FINAL DIAGNOSIS  Negative for malignant cells    Imaging   CT AP w/contrast 02/15/17 report pending    Assessment/Plan   Ryan Shepard???is a 76 y.o.???male???with history of COPD (on baseline 3L), grade I diastolic dysfunction, atrial fibrillation (on apixaban),???HTN, DM2, PAD s/p femoral graft, GERD admitted to [facility] ???12/6 for dyspnea with LUL consolidation; transferred to MICU 12/13 for worsening mixed hypoxemic/hypercapneic respiratory failure.  ???  #Mixed hypoxemic/hypercapneic respiratory failure (improving)   ???Acute hypoxemia with increasing O2 requirement and hypercapnia following bronchoscopy. Highest concern for post-procedural inflammation/bronchoconstriction, however differential also includes acute bacterial pneumonia including aspiration in setting of sedation. Initial concern for ???pulmonary aspergillosis given???positive sputum???cultures on admission, however BAL cultures remain negative (+candida only, likely colonizer) with negative galactomanan less suggestive of pathogenic infection. Fungal infection and malignancy remain on differential for chronic respiratory symptoms with multifocal consolidations and intrathoracic lymphadenopathy. s/p vanc (12/14), pip-tazo (12/14-12/15), s/p voriconazole (12/14-16); held per ID recs given BAL cultures NTD, s/p IV Ceftriaxone and metronidazole (12/15-20)  - Pulm consulted, appreciate rec's:  - on 3L NC,  for SpO2 >88% (on home NC 3L)  - continue IV Vancomycin (per pharmacy) and IV piperacillin/tazobactam 3.375 g q8h (12/18-  - continue IV methylprednisolone 40 mg daily x 5 days for  COPD exacerbation/airway inflammation post bronch (12/18-  - follow repeat sputum cultures from 02/13/18, NGTD  - respiratory cytology 12/13 negative for malignant cells  - pulm planning for EBUS once patient off clopidogrel for at least 5 days --> however if CT A/P shows LAD that is easier to biopsy will pursue that route  - diurese for euvolemia or -500   - ???bipap at night, daily VBG   -???scheduled albuterol/ipratropium nebs???q4h  - budesonide-formoterol 160-4.5 mcg 2 puffs BID   - encourage incentive spirometry   - plan for discharge on triple therapy (LABA/LAMA/ICS) --> submit PA for Trelegy Ellipta 1 puff daily - pleural effusions on CT chest visualized, pulm following for bedside U/S with possible thoracentesis today   ??????  #HFpEF  Grade I diastolic  dysfunction per 01/2017 TTE, EF 70%. Evidence of pulmonary edema on CXR following MICU transfer. Appears on more euvolemic on exam today.   - HOLD IV furosemide 40 mg BID    -???goal net > -500 cc (net neg -300 yesterday)  - daily weights (1 kg up yesterday)   - strict ins/outs     #AKI (resolving)   Cr down to 0.91 today, likely 2/2 increase in diuresis  ???  #Afib  Rate-controlled  - on continuous cardiac monitoring   - carvedilol 25 mg mg???BID    - holding apixaban 5 mg BID for upcoming EBUS, on heparin gtt   ???  #Intrathoracic LAD  On clarification, there is no well defined pulmonary mass, rather, ''Coalescent airspace and nodular consolidation within the???left upper lobe and lingula,???and to a lesser extent, within the right upper and right lower lobes, consistent with pneumonia, likely fungal. Associated bulky intrathoracic multicompartmental lymphadenopathy'' on CT scan. There is some c/f malignancy given the LAD, am pulm is considering EBUS, patient reports remote history of prostate cancer s/p surgery and chemotherapy around 2000.   - requested records from [facility]  - ordered CT A/P with contrast to evaluate for malignancy and LAD for biopsy to avoid EBUS if possible     #HTN  SBP 150s-160s, on methylprednisolone and scheduled duonebs that may be contributing to HTN, d/c hydralazine 50 mg TID which showed no improvement in BP  - losartan 100 mg daily   - carvedilol increased to 25 mg BID 02/15/17  ???  #DM2   Blood glucose at goal   - insulin sliding scale algorithm #3  - gabapentin 800 mg ???TID   - consider restarting home oral metformin upon discharge   ???  #PAD  s/p femoral-femoral graft 2009.  - holding clopidogrel 75 mg daily for EBUS vs thoracentesis vs biopsy   - holding gtt for possible thoracentesis  ???  #HLD chronic/stable  - pravastatin 40 mg nightly   ??? #GERD chronic/stable  - pantoprazole 40 mg daily   ???  #depression/ anxiety  Mood stable, no SI, no anxiety  - buspirone 15 mg BID  - duloxetine DR 60   - lorazepam 2 mg PRN BID  ???  #chronic pain. Localized to lower back and lower extremities  - norco 5-325 q6h PRN  - schedule outpatient pain clinic follow up   ???  #genital HSV (resolved)   Throughout admission patient was noted to have active flare. 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   - s/p acyclovir 400mg  po TID (12/10-12/16)    Inpatient Checklist  Diet: Diet 2 gram sodium  Diet carbohydrate controlled  DVT Prophylaxis: apixaban  GI Prophylaxis: PPI  Central Lines: None???  Tubes/Drains: None???  ???  Code Status:???  Full Code    Contact: ???Primary Emergency Contact: Checketts,Johnnie, Home Phone: [phone]  ???  DWA Dr. Jackelyn Hoehn     Author:  Bascom Levels, MD  [phone]  02/16/2017 7:12 AM    Attending Addendum:  Date of Service: 02/16/2017    I saw and evaluated Ryan Shepard. I discussed the case with the resident and agree with the findings and plan of care as documented in the resident's note above. I personally reviewed the interval physician notes, nursing notes, and allied health professional notes, imaging, labs and microbiologic information over the last 24 hours.    A/P:  CT chest w area of consolidation in L lobe c/f malignancy, CT abd w 4 hepatic masses c/f mets  in s/o h/o bladder and prostate CA. Pulm did bedside US but pleural effusion not large enough for tap, plan for liver bx as outpt. Transition to enoxaparin. Resume IV lasix today.     Greater than 50% of a 35 minute encounter was spent on direct patient care activities, counseling of the patient and/or family, and coordination of care for the problems discussed in my note, as well as: discussion of care plan and management options, discussion with other treating/consulting physicians.    Ruhani Umland L. Jackelyn Hoehn, MD  02/16/2017 4:38 PM  Clinical Instructor, [facility] Internal Medicine

## 2017-02-16 NOTE — Other
Patients Clinical Goal:   Clinical Goal(s) for the Shift: SpO2>92%, SBP <160, pain <5/10  Identify possible barriers to advancing the care plan:   Stability of the patient: Moderately Stable - low risk of patient condition declining or worsening   End of Shift Summary: AOX4, pleasant. SpO2 >90% on 3L NC O2, no SOB noted. SBP 157-166. Generalized pain managed w/ PRN Norco.  Heparin gtt infusing at 1700 units/hr, next APTT draw tomorrow AM. IV abx given as ordered. Chest physio done. CT abd/chest done. Good appetite noted. OOB to & from chair w/ minimal assist. Rested w/o distress. WCTM.

## 2017-02-16 NOTE — Progress Notes
PULMONARY AND CRITICAL CARE CONSULT NOTE    Patient Name: Ryan Shepard  MRN: [id number]  DOB: 1940/03/30  PMD: Wilburn Mylar, MD  Date of Admission: 02/01/2017    Date of Service: 02/16/2017  Hospital Day: 55     Reason for Consultation: hypoxemic respiratory failure    Subjective:   Ryan Shepard is a 76 y.o. year old male with COPD (no PFT available, on tiotropium, budesonide-formoterol), prior tobacco use (>40 pack years, adamant that he quit ~ 20-25 years ago), PAD s/p femoral graft (2009, on plavix qD), Hx of bladder/prostate malignancy (per patient, had surgery years ago), HTN, HLD, DM2, pAfib (on apixaban) GERD, depression / anxiety, chronic pain, who presented with worsened dyspnea and sputum production on 12/6, concerning for a COPD exacerbation.  ???  Patient reportedly has had a 3L NC O2 requirement for the past 3 years, although only intermittently uses, but noticed saturations of~ 80% prior to admission.  Endorsed some recent lower extremity swelling, and orthopnea, and was recently started on diuretics.  Endorses two prior hospitalizations in the past year for presumed COPD exacerbations, including one admission to the ICU at an OSH in [place], but denies any history of intubation or NIPPV.  Admitted on 12/6 for presumed COPD exacerbation reported in the setting of ongoing tobacco use; CXR demonstrating confluent airspace opacities concerning for multifocal aspiration/pneumonia although procalcitonin was negative at that time..  Patient endorsed worsened dyspnea (exertional > rest), as well as increased productive cough with ''variable'' color sputum, including ''rust.''  Denies any frank hemoptysis, fevers, chills, night sweats, chest pain, palpitations; thinks he may have been losing weight although is uncertain.  ???  Denies any history of homelessness, incarceration or recent travel outside of the country.  Went to [place] in the summer, and [place] in the fall.  ??? Patient was treated with levofloxacin, pred 40 x 5 days, and duonebs.  On 12/11, patient was deemed ready for discharge with a stable 2-3L NC requirement (although per patient not back to his baseline); however, respiratory cultures from admission demonstrated candida and aspergillus species.  Patient underwent CT chest demonstrating: nodular consolidation within LUL and lingula, as well as RUL and RLL read as consistent with likely fungal infection.  In addition, patient found to have  bulky intrathoracic multicompartamental lymphadenopathy.   ID consultation recommended pulmonary evaluation for bronchoscopy with likely biopsy.  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    INTERVAL:  12/17: feels he is breathing easier, still on HFNC 40L/40% FIO2, BAL cx NGTD, cytology pending  12/18: BAL cytology negative, transitioned to hep gtt and plavix held. Remains on HFNC 40L 45%. CXR with improved right side, worsened left side.   12/19: feels the same, remains on HFNC 40L/40%  12/20: doing well, off HFNC and on his home 3L NC this AM. CT staging pending. EBUS scheduled for next Wednesday 12/26 as outpatient in [facility]   12/21: doing well and remains on 3L NC, CT done, showing liver mets and slight progression of LUL findings compared to prior. Discussed next diagnostic steps with pt and wife. He wanted to proceed with thora, however pocket too small so plan for IR guided done next week.       Active hospital problems include:  Active Problems:    Lower back pain POA: Yes    OA (osteoarthritis) of knee POA: Yes    Primary osteoarthritis of knees, bilateral POA: Yes  COPD exacerbation (HCC/RAF) POA: Yes    COPD (chronic obstructive pulmonary disease) (HCC/RAF) POA: Yes    Chronic prescription opiate use POA: Not Applicable    Diabetes (HCC/RAF) POA: Yes    PAD (peripheral artery disease) (HCC/RAF) POA: Yes    Hypertension POA: Yes    Hypercholesteremia POA: Yes GERD (gastroesophageal reflux disease) POA: Yes    Pneumonia POA: Unknown    Lower extremity edema POA: Unknown    Memory loss POA: Unknown    Genital herpes POA: Clinically Undetermined    Acute and chronic respiratory failure with hypoxia (HCC/RAF) POA: Yes  Resolved Problems:    * No resolved hospital problems. *        Objective:   Vital Signs:  Temp:  [36 ???C (96.8 ???F)-36.6 ???C (97.9 ???F)] 36 ???C (96.8 ???F)  Heart Rate:  [80-105] 94  Resp:  [16-22] 17  BP: (157-167)/(69-83) 163/70  NBP Mean:  [92-105] 92  SpO2:  [91 %-96 %] 96 %      Intake/Output Summary (Last 24 hours) at 02/16/17 4782  Last data filed at 02/16/17 9562   Gross per 24 hour   Intake             2056 ml   Output             3125 ml   Net            -1069 ml       Physical Exam:  Gen: Well-developed male in no acute distress  Eyes: PERRL, EOMI  ENT: OP clear, MMM  Neck: Supple, trachea midline, no cervical or supraclavicular lymphadenopathy  CV: RRR, normal S1S2, no m/r/g  Pulm: No wheezing, bibasilar crackles no further rhonchi  Abd: NABS, soft, flat, NTND  Skin: Warm and dry, no rashes  Ext: No cyanosis or edema. Distal pulses 2+ and symmetric  Neuro: A&Ox4, CNII-XII intact    Medications:  Scheduled:   ??? albuterol  2.5 mg Nebulization Q4H   ??? budesonide-formoterol  2 puff Inhalation BID   ??? busPIRone  15 mg Oral BID   ??? carvedilol  25 mg Oral BID   ??? docusate  100 mg Oral BID   ??? DULoxetine  60 mg Oral Daily   ??? gabapentin  800 mg Oral TID   ??? ipratropium  0.5 mg Nebulization Q4H   ??? losartan  100 mg Oral Daily   ??? methylPREDNISolone IV  40 mg Intravenous Daily   ??? pantoprazole  40 mg Oral Daily   ??? piperacillin/tazobactam  3.375 g Intravenous Q8H   ??? polyethylene glycol  17 g Oral Daily   ??? pravastatin  40 mg Oral QHS   ??? pyridoxine  100 mg Oral Daily   ??? senna  1 tablet Oral QHS   ??? vancomycin  1.5 g Intravenous Q12H     Continuous Transfusions:   ??? heparin 25,000 units/250 mL drip RTU 1,700 Units/hr (02/16/17 0233) ??? sodium chloride 5 mL/hr (02/13/17 1203)     PRN: insulin aspart **AND** dextrose, diphenhydrAMINE, HYDROcodone-acetaminophen, LORazepam    Labs:  CBC: WBC/Hgb/Hct/Plts:  10.78/12.8/40.2/297 (12/21 0650)  Chemistry:Na/K/Cl/CO2/BUN/Cr/glu:  137/4.5/96/30/17/0.89/158 (12/21 0650)  Coags: PT/INR/APTT/Fib:  --/--/86.7/-- (12/21 0650)  LFTS:    ABG:      POC Glucose:   Recent Labs      02/15/17   2159  02/15/17   1826  02/15/17   1223  02/15/17   0850   GLUCOSEPOC  310*  216*  286*  159*       Microbiology:   12/13 BAL:    AFB NTD    Bact NTD    Fungal: Candida    GS: negative    Biofire: negative  Aspergillus Fumigatus 02/05/17: <0.10  Cryptococcal Ag blood 02/06/17: negative  Bacterial Culture Respiratory 02/01/17: Few Probable candida albicans , Aspergillus [place]???   MRSA nares 02/01/17: No Methicillin-resistant Staphylococcus aureus isolated.   Sputum screen 02/01/17: <10 Epithelial cells per low power field   Resp bacterial Cx 02/01/17: <10 Epithelial cells per low power field   Legionella urine 02/01/17: negative     Imaging:  Pleural [place] 12/21:      Left pleural space with diaphragm and small pocket of pleural effusion    CT Chest 12/20:   1.   Redemonstration of coalescent airspace and nodular consolidation, most prominent in the left upper lobe and lingula with peripheral groundglass associated with Unchanged to slightly more prominent mediastinal and bilateral hilar lymphadenopathy.   This finding is nonspecific but concerning for malignancy versus pneumonia with etiologies including fungal infection.   2.  Increase in size of pleural and pericardial effusions and anasarca consistent with volume overload.  3.   Dilated main pulmonary artery measuring 39 mm reflecting pulmonary arterial hypertension.  4.  Severe coronary artery calcifications.     CT A/P 12/20:   History of prostate cancer treated with total prostatectomy with at least 4 hypervascular hepatic masses measuring up to 5 cm in the right hepatic lobe, suspicious for metastatic disease. No abdominal or pelvic lymphadenopathy.  ???  Severe atherosclerotic disease with thrombosed left common, external, and femoral artery, and patent femorofemoral bypass.    CXR 12/18:  Interval stability of left greater than right asymmetric interstitial alveolar opacities in favor of multifocal pneumonia superimposed on edema.  Stable appearance of small layering left effusion and small right effusion.  Stable heart and mediastinum. The thoracic aorta is mildly calcified, tortuous and ectatic.  No acute findings or significant changes elsewhere.    02/05/2017 CT Chest w/o contrast:  IMPRESSION:  Atherosclerotic calcification of the thoracic aorta, brachiocephalic, bilateral common carotid, bilateral subclavian and bilateral axillary, left anterior descending, circumflex and right coronary arteries. Enlargement of the central pulmonary arteries, the main pulmonary artery currently 39 mm in maximal axial diameter, with associated calcifications, consistent with pulmonary arterial hypertension. Small left pleural effusion. Small pericardial effusion.??????Moderate smoking-related centrilobular emphysema.??????Coalescent airspace and nodular consolidation within the left upper lobe and lingula, and to a lesser extent, within the right upper and right lower lobes, consistent with pneumonia, likely fungal. Associated bulky intrathoracic multicompartmental lymphadenopathy; for example, AP window lymph node, currently 38 x 23 mm (3-40).    02/01/2017 TTE:  ???1. Technically difficult study due to poor acoustic windows.  ???2. Small LV size, moderate concentric left ventricular hypertrophy, hyperdynamic systolic function, mild LV diastolic dysfunction (grade I, impaired relaxation).  ???3. Left ventricular ejection fraction is approximately 70-75%.  ???4. Normal right ventricular size and normal systolic function.  ???5. There are no prior echocardiograms available for comparison.    Pathology: BAL cytology: negative    Problem Based Assessment and Plan     [name]???is a 76 y.o.???male???with history of COPD (on baseline 3L), grade I diastolic dysfunction, atrial fibrillation (on apixaban), HTN, DM2, PAD s/p femoral graft, GERD admitted to [facility]  12/6 for dyspnea with LUL consolidation; transferred to MICU 12/13 for worsening mixed hypoxemic/hypercapneic respiratory failure, now back to floor.  ???  # Mixed  hypoxemic/hypercapneic respiratory failure- improved since starting lasix and prednisone, now back on home 3L NC. Rec continued pulm hygiene.  # Bulky Mediastinal lymphadenopathy, hepatic lesions- concerning for malignancy. Given that BAL cytology is negative, we recommend further testing. F/U staging CT revealed the hepatic masses and slightly worsened Mediastinal LAD and worsened L pleural effusion. Given these findings, we recommend obtaining IR-guided biopsy of hepatic lesions (on [place] of left pleural space, the fluid is quite small and would be high risk for complication. Needs to continue to hold plavix and apixaban until that time, can be dc'd home vs SNF with lovenox, which will be held the night before procedure.  # Multifocal bilateral pneumonia with nodular changes- concerning for malignancy vs infection vs combined both. Complete 14d abx today.  # COPD on home O2: recommend discharging patient on trelegy inhaler (please submit PA)    Summary of Recommendations:  - Please continue to hold plavix (last dose on 02/12/17), will need to be off at least 5 days to consider a diagnostic procedure.  - EBUS cancelled  - recommend IR consult for percutaneous CT guided biopsy of liver lesions  - DC abx, as today is day 14  - Agree with short course of 40mg  solumedrol vs. oral prednisone 5d total  - Goal Spo2 >88%  - Aggressive pulmonary hygiene: IS and aerobika q4h by RT  - Agree with gentle diuresis in case of HFpEF component to hypoxia - Can transition from heparin gtt to lovenox therapeutic dose on discharge in anticipation of biopsy procedure next week, will need to hold the dose the evening before procedure  - Will plan for discharge on triple therapy (LABA/LAMA/ICS), consider submitting PA for Trelegy Ellipta, 1 inhalation daily.  - patient needs good outpt follow-up to ensure that this likely malignancy work up is followed-through    Patient was discussed with attending, Dr. Hale Drone. We will sign off. Please page [phone] with questions    Newt Lukes, MD  Pulmonary and Critical Care Medicine Fellow

## 2017-02-16 NOTE — Other
Patients Clinical Goal:   Clinical Goal(s) for the Shift: SpO2 >92%, SBP<160, pain <3/10  Identify possible barriers to advancing the care plan:   Stability of the patient: Moderately Stable - low risk of patient condition declining or worsening   End of Shift Summary: AOX4, pleasant. SpO2 >91% on 3L NC O2, no complaints of SOB. SBP 163-180. Generalized pain controlled w/ PRN Norco 2 tabs. Heparin drip discontinued by MD. IV antibiotics given as ordered. OOB to & from [facility] w/ min assist. BS 158-370, SSI given per protocol. BP 180/83 in the afternoon, pt denies headache and/or dizziness. Patient stated ''feeling good.'' MD notified. PO PRN Hydralazine given as ordered. Frequent rounding done. Will continue to monitor. Will continue to monitor.

## 2017-02-16 NOTE — Telephone Encounter
Good morning,    Patient Ryan Shepard [id number]    Is the patient being discharged to SNF? No  Clinic/Test/Procedure to be scheduled:[facility]  Doctor Preferences (If applicable): first available  Time Frame: 5 days  Reason for Appointment/Procedure: establish care, post dc follow-up    PCP  Dr. Cleophus Molt, 02/20/2017 Friday @ 9.30 am  [address], [place] [address]    Clinic/Test/Procedure to be scheduled: pulmonology  Doctor Preference (If applicable): any  Time Frame: 2 weeks  Reason for Appointment/Procedure:post-hospitalization follow-up for COPD, growing aspergillus    PULMONOLOGY  Dr. Victorio Palm, 03/01/2017 Thursday @ 8.00 am      [address] [address]    Clinic/Test/Procedure to be scheduled: chronic pain  Doctor Preference (If applicable): any  Time Frame: 2-4 wks  Reason for Appointment/Procedure: chronic pain, long term opiate use    PAIN [facility]  Dr. Dickie La, 03/09/2017 Friday @ 1.00 pm    [address], [place] [address]    Per navigator: Confirmed 3: 12/28 @ 9:30 am ((PCP), 01/03 @ 8:00 am (PULM), 01/11 @ 1:00 pm (PAIN [facility]ANG) appointment(s) and demographics with patient in-room and also provided appointment letters.    I will mail out confirmation letters as well.    [facility]  P [phone]  F [phone]

## 2017-02-16 NOTE — Progress Notes
PULMONARY AND CRITICAL CARE CONSULT NOTE    Patient Name: Ryan Shepard  MRN: [id number]  DOB: [birth year]  PMD: Wilburn Mylar, MD  Date of Admission: 02/01/2017    Date of Service: 02/15/2017  Hospital Day: 48     Reason for Consultation: hypoxemic respiratory failure    Subjective:   Ryan Shepard is a 76 y.o. year old male with COPD (no PFT available, on tiotropium, budesonide-formoterol), prior tobacco use (>40 pack years, adamant that he quit ~ 20-25 years ago), PAD s/p femoral graft (2009, on plavix qD), Hx of bladder/prostate malignancy (per patient, had surgery years ago), HTN, HLD, DM2, pAfib (on apixaban) GERD, depression / anxiety, chronic pain, who presented with worsened dyspnea and sputum production on 12/6, concerning for a COPD exacerbation.  ???  Patient reportedly has had a 3L NC O2 requirement for the past 3 years, although only intermittently uses, but noticed saturations of~ 80% prior to admission.  Endorsed some recent lower extremity swelling, and orthopnea, and was recently started on diuretics.  Endorses two prior hospitalizations in the past year for presumed COPD exacerbations, including one admission to the ICU at an OSH in [place], but denies any history of intubation or NIPPV.  Admitted on 12/6 for presumed COPD exacerbation reported in the setting of ongoing tobacco use; CXR demonstrating confluent airspace opacities concerning for multifocal aspiration/pneumonia although procalcitonin was negative at that time..  Patient endorsed worsened dyspnea (exertional > rest), as well as increased productive cough with ''variable'' color sputum, including ''rust.''  Denies any frank hemoptysis, fevers, chills, night sweats, chest pain, palpitations; thinks he may have been losing weight although is uncertain.  ???  Denies any history of homelessness, incarceration or recent travel outside of the country.  Went to [place] in the summer, and [place] in the fall.  ??? Patient was treated with levofloxacin, pred 40 x 5 days, and duonebs.  On 12/11, patient was deemed ready for discharge with a stable 2-3L NC requirement (although per patient not back to his baseline); however, respiratory cultures from admission demonstrated candida and aspergillus species.  Patient underwent CT chest demonstrating: nodular consolidation within LUL and lingula, as well as RUL and RLL read as consistent with likely fungal infection.  In addition, patient found to have  bulky intrathoracic multicompartamental lymphadenopathy.   ID consultation recommended pulmonary evaluation for bronchoscopy with likely biopsy.  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    INTERVAL:  12/17: feels he is breathing easier, still on HFNC 40L/40% FIO2, BAL cx NGTD, cytology pending  12/18: BAL cytology negative, transitioned to hep gtt and plavix held. Remains on HFNC 40L 45%. CXR with improved right side, worsened left side.   12/19: feels the same, remains on HFNC 40L/40%  12/20: doing well, off HFNC and on his home 3L NC this AM. CT staging pending. EBUS scheduled for next Wednesday 12/26 as outpatient in [facility]     Active hospital problems include:  Active Problems:    Lower back pain POA: Yes    OA (osteoarthritis) of knee POA: Yes    Primary osteoarthritis of knees, bilateral POA: Yes    COPD exacerbation (HCC/RAF) POA: Yes    COPD (chronic obstructive pulmonary disease) (HCC/RAF) POA: Yes    Chronic prescription opiate use POA: Not Applicable    Diabetes (HCC/RAF) POA: Yes    PAD (peripheral artery disease) (HCC/RAF) POA: Yes    Hypertension POA: Yes  Hypercholesteremia POA: Yes    GERD (gastroesophageal reflux disease) POA: Yes    Pneumonia POA: Unknown    Lower extremity edema POA: Unknown    Memory loss POA: Unknown    Genital herpes POA: Clinically Undetermined    Acute and chronic respiratory failure with hypoxia (HCC/RAF) POA: Yes  Resolved Problems: * No resolved hospital problems. *    Objective:   Vital Signs:  Temp:  [36.5 ???C (97.7 ???F)-37 ???C (98.6 ???F)] 36.6 ???C (97.9 ???F)  Heart Rate:  [80-105] 105  Resp:  [20-22] 20  BP: (151-166)/(69-86) 159/74  NBP Mean:  [94-110] 99  SpO2:  [90 %-94 %] 93 %      Intake/Output Summary (Last 24 hours) at 02/15/17 2157  Last data filed at 02/15/17 2000   Gross per 24 hour   Intake             1732 ml   Output             3450 ml   Net            -1718 ml       Physical Exam:  Gen: Well-developed male in no acute distress  Eyes: PERRL, EOMI  ENT: OP clear, MMM  Neck: Supple, trachea midline, no cervical or supraclavicular lymphadenopathy  CV: RRR, normal S1S2, no m/r/g  Pulm: No wheezing, bibasilar crackles no further rhonchi  Abd: NABS, soft, flat, NTND  Skin: Warm and dry, no rashes  Ext: No cyanosis or edema. Distal pulses 2+ and symmetric  Neuro: A&Ox4, CNII-XII intact    Medications:  Scheduled:   ??? albuterol  2.5 mg Nebulization Q4H   ??? budesonide-formoterol  2 puff Inhalation BID   ??? busPIRone  15 mg Oral BID   ??? carvedilol  25 mg Oral BID   ??? docusate  100 mg Oral BID   ??? DULoxetine  60 mg Oral Daily   ??? gabapentin  800 mg Oral TID   ??? ipratropium  0.5 mg Nebulization Q4H   ??? losartan  100 mg Oral Daily   ??? methylPREDNISolone IV  40 mg Intravenous Daily   ??? pantoprazole  40 mg Oral Daily   ??? piperacillin/tazobactam  3.375 g Intravenous Q8H   ??? polyethylene glycol  17 g Oral Daily   ??? pravastatin  40 mg Oral QHS   ??? pyridoxine  100 mg Oral Daily   ??? senna  1 tablet Oral QHS   ??? vancomycin  1.5 g Intravenous Q12H     Continuous Transfusions:   ??? heparin 25,000 units/250 mL drip RTU 1,700 Units/hr (02/15/17 1415)   ??? sodium chloride 5 mL/hr (02/13/17 1203)     PRN: insulin aspart **AND** dextrose, diphenhydrAMINE, HYDROcodone-acetaminophen, LORazepam    Labs:  CBC: WBC/Hgb/Hct/Plts:  9.54/12.5/38.4/283 (12/20 0522)  Chemistry:Na/K/Cl/CO2/BUN/Cr/glu:  134/4.8/94/29/23/0.91/215 (12/20 0522) Coags: PT/INR/APTT/Fib:  --/--/95.1/-- (12/20 0522)  LFTS:    ABG:      POC Glucose:   Recent Labs      02/15/17   1826  02/15/17   1223  02/15/17   0850  02/14/17   2213   GLUCOSEPOC  216*  286*  159*  274*       Microbiology:   12/13 BAL:    AFB NTD    Bact NTD    Fungal: Candida    GS: negative    Biofire: negative  Aspergillus Fumigatus 02/05/17: <0.10  Cryptococcal Ag blood 02/06/17: negative  Bacterial Culture Respiratory 02/01/17: Few Probable  candida albicans , Aspergillus [place]???   MRSA nares 02/01/17: No Methicillin-resistant Staphylococcus aureus isolated.   Sputum screen 02/01/17: <10 Epithelial cells per low power field   Resp bacterial Cx 02/01/17: <10 Epithelial cells per low power field   Legionella urine 02/01/17: negative     Imaging:  CXR 12/18:  Interval stability of left greater than right asymmetric interstitial alveolar opacities in favor of multifocal pneumonia superimposed on edema.  Stable appearance of small layering left effusion and small right effusion.  Stable heart and mediastinum. The thoracic aorta is mildly calcified, tortuous and ectatic.  No acute findings or significant changes elsewhere.    02/05/2017 CT Chest w/o contrast:  IMPRESSION:  Atherosclerotic calcification of the thoracic aorta, brachiocephalic, bilateral common carotid, bilateral subclavian and bilateral axillary, left anterior descending, circumflex and right coronary arteries. Enlargement of the central pulmonary arteries, the main pulmonary artery currently 39 mm in maximal axial diameter, with associated calcifications, consistent with pulmonary arterial hypertension. Small left pleural effusion. Small pericardial effusion.??????Moderate smoking-related centrilobular emphysema.??????Coalescent airspace and nodular consolidation within the left upper lobe and lingula, and to a lesser extent, within the right upper and right lower lobes, consistent with pneumonia, likely fungal. Associated bulky intrathoracic multicompartmental lymphadenopathy; for example, AP window lymph node, currently 38 x 23 mm (3-40).    02/01/2017 TTE:  ???1. Technically difficult study due to poor acoustic windows.  ???2. Small LV size, moderate concentric left ventricular hypertrophy, hyperdynamic systolic function, mild LV diastolic dysfunction (grade I, impaired relaxation).  ???3. Left ventricular ejection fraction is approximately 70-75%.  ???4. Normal right ventricular size and normal systolic function.  ???5. There are no prior echocardiograms available for comparison.    Pathology:   BAL cytology: negative    Problem Based Assessment and Plan     Ryan Shepard???is a 76 y.o.???male???with history of COPD (on baseline 3L), grade I diastolic dysfunction, atrial fibrillation (on apixaban), HTN, DM2, PAD s/p femoral graft, GERD admitted to [facility]  12/6 for dyspnea with LUL consolidation; transferred to MICU 12/13 for worsening mixed hypoxemic/hypercapneic respiratory failure, now back to floor.  ???  # Mixed hypoxemic/hypercapneic respiratory failure- improved since starting lasix and prednisone, now back on home 3L NC. Rec continued pulm hygiene.  # Bulky Mediastinal lymphadenopathy- concerning for malignancy vs 2/2 infectious process. Given that BAL cytology is negative, we recommend further testing. F/U staging CT to look for another area of LAD that could be biopsied. If mediastinal nodes are deemed safest point to biopsy, then plan for EBUS, which has been scheduled for Wednesday 12/26 in [facility]. Pt needs to hold plavix and apixaban until that time, can be dc'd home vs SNF with lovenox, which will be held the night before procedure. .  # Multifocal bilateral pneumonia with nodular changes- concerning for malignancy vs infection vs combined both. See above    Summary of Recommendations:  - Please continue to hold plavix (last dose on 02/12/17), will need to be off at least 5 days to consider a diagnostic bronchoscopy with EBUS TBNA for workup of lymphadenopathy. Given scheduling limitations with the holiday, would favor discharge and plan for outpatient procedure next week.  - Agree with vanc/zosyn, recommend total 10-14d course  - Agree with short course of 40mg  solumedrol vs. oral prednisone 5d total  - Goal Spo2 >88%  - Aggressive pulmonary hygiene: IS and aerobika q4h by RT  - Agree with gentle diuresis in case of HFpEF component to hypoxia  - Can transition from heparin gtt  to lovenox therapeutic dose on discharge in anticipation of biopsy procedure next week, will need to hold the dose the evening before procedure  - Agree with obtaining staging CT C/A/P with IV contrast to ascertain if patient has other areas of LAD that could be biopsied  - Will plan for discharge on triple therapy (LABA/LAMA/ICS), consider submitting PA for Trelegy Ellipta, 1 inhalation daily.    Patient was discussed with attending, Dr. Hale Drone.    Newt Lukes, MD  Pulmonary and Critical Care Medicine Fellow  Pager [phone]    PULMONARY & CRITICAL CARE ATTENDING ADDENDUM:  I have seen and independently examined Ryan Shepard on the original date of service above. I personally reviewed all pertinent data including vitals, labs, studies, and imaging as described above. I have discussed the diagnoses and treatment recommendations including risks, benefits and alternatives of all medications, treatments, and procedures with the patient and/or surrogate and the rest of the care team.     I agree with the findings, assessment, and recommendations as detailed by Dr. Lynnell Chad, which we formulated jointly. Any corrections have been made above and any additions and clarifications follow below.    All questions were answered to the satisfaction of the patient/family. Time spent in education,counseling, and coordination of care for the topics listed above exceeded 35 minutes and was >50% of the total visit time.      Electronically signed:  Jerry Caras, MD  Clinical Instructor of Medicine  Division of Pulmonary & Critical Care  [employer] at [facility]  Email: [email or web address]  Pager: [phone], ID [phone]  Office: [phone]  Fax:  [phone]  02/16/2017 8:49 AM

## 2017-02-16 NOTE — Consults
Per IDR, pending CT scan. Dispo TBD     4:17 PM Met with pt at bedside and provided info re: accepting SNF [facility], pt states he would prefer to go home with [facility].   Pt states he has a lot of family support and would feel better if he were home. Discussed with pt difference with amount of PT/OT he would get with [facility] vs SNF.   Pt states he will keep an open mind re: SNF and will consider if MD is saying he needs SNF prior to going home.

## 2017-02-16 NOTE — Telephone Encounter
Confirmed 3: 12/28 @ 9:30 am ((PCP), 01/03 @ 8:00 am (PULM), 01/11 @ 1:00 pm (PAIN MANG) appointment(s) and demographics with patient in-room and also provided appointment letters.

## 2017-02-16 NOTE — Other
Patients Clinical Goal:   Clinical Goal(s) for the Shift: vss, safety  Identify possible barriers to advancing the care plan:   Stability of the patient: Moderately Stable - low risk of patient condition declining or worsening   End of Shift Summary: Pt alert and oriented x4. Pain managed with prn norco. Ativan given once for anxiety. No complaints of SOB on 3L NC. Heparin drip continued at 1700 units/hr. Am aptt pending.

## 2017-02-17 DIAGNOSIS — R001 Bradycardia, unspecified: Secondary | ICD-10-CM

## 2017-02-17 DIAGNOSIS — A64 Unspecified sexually transmitted disease: Secondary | ICD-10-CM

## 2017-02-17 LAB — Glucose,POC
GLUCOSE,POC: 248 mg/dL — ABNORMAL HIGH (ref 65–99)
GLUCOSE,POC: 213 mg/dL — ABNORMAL HIGH (ref 65–99)
GLUCOSE,POC: 135 mg/dL — ABNORMAL HIGH (ref 65–99)
GLUCOSE,POC: 236 mg/dL — ABNORMAL HIGH (ref 65–99)

## 2017-02-17 LAB — Magnesium: MAGNESIUM: 1.7 meq/L (ref 1.4–1.9)

## 2017-02-17 LAB — Vancomycin,trough: VANCOMYCIN,TROUGH: 20.4 ug/mL — ABNORMAL HIGH (ref 10.0–20.0)

## 2017-02-17 LAB — CBC: RED CELL DISTRIBUTION WIDTH-SD: 46.4 fL (ref 36.9–48.3)

## 2017-02-17 LAB — Differential Automated: LYMPHOCYTE PERCENT, AUTO: 13.3 (ref 0.00–0.04)

## 2017-02-17 LAB — Basic Metabolic Panel
SODIUM: 138 mmol/L (ref 135–146)
UREA NITROGEN: 16 mg/dL (ref 7–22)

## 2017-02-17 LAB — Blood Gases,venous: O2 SATURATION/MEASURED: 86

## 2017-02-17 MED ORDER — ENOXAPARIN SODIUM 100 MG/ML SC SOLN
1 mg/kg | INJECTION | Freq: Two times a day (BID) | SUBCUTANEOUS | 3 refills | 60.00 days | Status: AC
Start: 2017-02-17 — End: 2017-02-21
  Filled 2017-02-22: qty 60, 60d supply, fill #0

## 2017-02-17 MED ADMIN — IPRATROPIUM BROMIDE 0.02 % IN SOLN: 500 ug | RESPIRATORY_TRACT | @ 09:00:00 | Stop: 2017-02-19

## 2017-02-17 MED ADMIN — BUSPIRONE HCL 5 MG PO TABS: 15 mg | ORAL | @ 06:00:00 | Stop: 2017-02-21 | NDC 16729020001

## 2017-02-17 MED ADMIN — PRAVASTATIN SODIUM 40 MG PO TABS: 40 mg | ORAL | @ 06:00:00 | Stop: 2017-02-22 | NDC 51079078220

## 2017-02-17 MED ADMIN — IPRATROPIUM BROMIDE 0.02 % IN SOLN: 500 ug | RESPIRATORY_TRACT | @ 01:00:00 | Stop: 2017-02-19 | NDC 00487980101

## 2017-02-17 MED ADMIN — POLYETHYLENE GLYCOL 3350 17 G PO PACK: 17 g | ORAL | @ 18:00:00 | Stop: 2017-02-22

## 2017-02-17 MED ADMIN — ALBUTEROL SULFATE (5 MG/ML) 0.5% IN NEBU: 2.5 mg | RESPIRATORY_TRACT | @ 13:00:00 | Stop: 2017-02-19

## 2017-02-17 MED ADMIN — IPRATROPIUM BROMIDE 0.02 % IN SOLN: 500 ug | RESPIRATORY_TRACT | @ 04:00:00 | Stop: 2017-02-19 | NDC 00487980101

## 2017-02-17 MED ADMIN — NIFEDIPINE ER OSMOTIC RELEASE 30 MG PO TB24: 30 mg | ORAL | @ 20:00:00 | Stop: 2017-02-22 | NDC 50268059715

## 2017-02-17 MED ADMIN — HYDROCODONE-ACETAMINOPHEN 5-325 MG PO TABS: 2 | ORAL | @ 20:00:00 | Stop: 2017-02-22 | NDC 00406012362

## 2017-02-17 MED ADMIN — HYDRALAZINE HCL 25 MG PO TABS: 25 mg | ORAL | @ 02:00:00 | Stop: 2017-02-22 | NDC 00904644161

## 2017-02-17 MED ADMIN — ALBUTEROL SULFATE (5 MG/ML) 0.5% IN NEBU: 2.5 mg | RESPIRATORY_TRACT | @ 09:00:00 | Stop: 2017-02-19

## 2017-02-17 MED ADMIN — CARVEDILOL 25 MG PO TABS: 25 mg | ORAL | @ 18:00:00 | Stop: 2017-02-22 | NDC 00904630361

## 2017-02-17 MED ADMIN — DULOXETINE HCL 60 MG PO CPEP: 60 mg | ORAL | @ 18:00:00 | Stop: 2017-02-22 | NDC 00904645461

## 2017-02-17 MED ADMIN — ALBUTEROL SULFATE (5 MG/ML) 0.5% IN NEBU: 2.5 mg | RESPIRATORY_TRACT | @ 16:00:00 | Stop: 2017-02-19 | NDC 00487990130

## 2017-02-17 MED ADMIN — ALBUTEROL SULFATE (5 MG/ML) 0.5% IN NEBU: 2.5 mg | RESPIRATORY_TRACT | @ 19:00:00 | Stop: 2017-02-19 | NDC 00487990130

## 2017-02-17 MED ADMIN — BUSPIRONE HCL 5 MG PO TABS: 15 mg | ORAL | @ 18:00:00 | Stop: 2017-02-21 | NDC 16729020001

## 2017-02-17 MED ADMIN — METHYLPREDNISOLONE SODIUM SUCC 40 MG IJ SOLR: 40 mg | INTRAVENOUS | @ 18:00:00 | Stop: 2017-02-18 | NDC 00009003928

## 2017-02-17 MED ADMIN — INSULIN ASPART 100 UNIT/ML SC SOPN: 3 mL | SUBCUTANEOUS | @ 06:00:00 | Stop: 2017-02-22 | NDC 00169633910

## 2017-02-17 MED ADMIN — DOCUSATE SODIUM 100 MG PO CAPS: 100 mg | ORAL | @ 06:00:00 | Stop: 2017-02-22 | NDC 00904645561

## 2017-02-17 MED ADMIN — PIPERACILLIN-TAZOBACTAM 3.375 GM/50 ML RTU (EXT 4HR): 3.375 g | INTRAVENOUS | @ 23:00:00 | Stop: 2017-02-18 | NDC 00206886102

## 2017-02-17 MED ADMIN — ALBUTEROL SULFATE (5 MG/ML) 0.5% IN NEBU: 2.5 mg | RESPIRATORY_TRACT | @ 01:00:00 | Stop: 2017-02-19 | NDC 00487990130

## 2017-02-17 MED ADMIN — GABAPENTIN 400 MG PO CAPS: 800 mg | ORAL | @ 20:00:00 | Stop: 2017-02-22 | NDC 68084077401

## 2017-02-17 MED ADMIN — GABAPENTIN 400 MG PO CAPS: 800 mg | ORAL | @ 14:00:00 | Stop: 2017-02-22 | NDC 68084077401

## 2017-02-17 MED ADMIN — ENOXAPARIN SODIUM 100 MG/ML SC SOLN: 100 mg | SUBCUTANEOUS | @ 18:00:00 | Stop: 2017-02-19 | NDC 63323056884

## 2017-02-17 MED ADMIN — DOCUSATE SODIUM 100 MG PO CAPS: 100 mg | ORAL | @ 18:00:00 | Stop: 2017-02-22 | NDC 00904645561

## 2017-02-17 MED ADMIN — IPRATROPIUM BROMIDE 0.02 % IN SOLN: 500 ug | RESPIRATORY_TRACT | @ 16:00:00 | Stop: 2017-02-19 | NDC 00487980101

## 2017-02-17 MED ADMIN — INSULIN ASPART 100 UNIT/ML SC SOPN: 3 mL | SUBCUTANEOUS | @ 21:00:00 | Stop: 2017-02-22 | NDC 00169633910

## 2017-02-17 MED ADMIN — IPRATROPIUM BROMIDE 0.02 % IN SOLN: 500 ug | RESPIRATORY_TRACT | @ 19:00:00 | Stop: 2017-02-19 | NDC 00487980101

## 2017-02-17 MED ADMIN — INSULIN ASPART 100 UNIT/ML SC SOPN: 3 mL | SUBCUTANEOUS | @ 01:00:00 | Stop: 2017-02-22 | NDC 00169633910

## 2017-02-17 MED ADMIN — ENOXAPARIN SODIUM 100 MG/ML SC SOLN: 100 mg | SUBCUTANEOUS | @ 06:00:00 | Stop: 2017-02-19 | NDC 63323056884

## 2017-02-17 MED ADMIN — HYDROCODONE-ACETAMINOPHEN 5-325 MG PO TABS: 2 | ORAL | @ 01:00:00 | Stop: 2017-02-22 | NDC 00406012362

## 2017-02-17 MED ADMIN — PYRIDOXINE HCL 50 MG PO TABS: 100 mg | ORAL | @ 18:00:00 | Stop: 2017-02-22 | NDC 00536440801

## 2017-02-17 MED ADMIN — SENNOSIDES 8.6 MG PO TABS: 1 | ORAL | @ 06:00:00 | Stop: 2017-02-21 | NDC 00904652261

## 2017-02-17 MED ADMIN — IPRATROPIUM BROMIDE 0.02 % IN SOLN: 500 ug | RESPIRATORY_TRACT | @ 13:00:00 | Stop: 2017-02-19

## 2017-02-17 MED ADMIN — ALBUTEROL SULFATE (5 MG/ML) 0.5% IN NEBU: 2.5 mg | RESPIRATORY_TRACT | @ 04:00:00 | Stop: 2017-02-19 | NDC 00487990130

## 2017-02-17 MED ADMIN — FUROSEMIDE 10 MG/ML IJ SOLN: 40 mg | INTRAVENOUS | @ 21:00:00 | Stop: 2017-02-17 | NDC 36000028325

## 2017-02-17 MED ADMIN — PANTOPRAZOLE SODIUM 40 MG PO TBEC: 40 mg | ORAL | @ 18:00:00 | Stop: 2017-02-22 | NDC 68084081309

## 2017-02-17 MED ADMIN — VANCOMYCIN 250 ML IVPB: 1.25 g | INTRAVENOUS | @ 18:00:00 | Stop: 2017-02-18 | NDC 47781059791

## 2017-02-17 MED ADMIN — VANCOMYCIN 500 ML IVPB: 1.5 g | INTRAVENOUS | @ 06:00:00 | Stop: 2017-02-17 | NDC 47781059791

## 2017-02-17 MED ADMIN — HYDROCODONE-ACETAMINOPHEN 5-325 MG PO TABS: 2 | ORAL | @ 08:00:00 | Stop: 2017-02-22 | NDC 00406012362

## 2017-02-17 MED ADMIN — GABAPENTIN 400 MG PO CAPS: 800 mg | ORAL | @ 06:00:00 | Stop: 2017-02-22 | NDC 68084077401

## 2017-02-17 MED ADMIN — BUDESONIDE-FORMOTEROL FUMARATE 160-4.5 MCG/ACT IN AERO: 2 | RESPIRATORY_TRACT | @ 18:00:00 | Stop: 2017-02-22 | NDC 00186037028

## 2017-02-17 MED ADMIN — CARVEDILOL 25 MG PO TABS: 25 mg | ORAL | @ 06:00:00 | Stop: 2017-02-22 | NDC 00904630361

## 2017-02-17 MED ADMIN — HYDROCODONE-ACETAMINOPHEN 5-325 MG PO TABS: 2 | ORAL | @ 14:00:00 | Stop: 2017-02-22 | NDC 00406012362

## 2017-02-17 MED ADMIN — BUDESONIDE-FORMOTEROL FUMARATE 160-4.5 MCG/ACT IN AERO: 2 | RESPIRATORY_TRACT | @ 04:00:00 | Stop: 2017-02-22 | NDC 00186037028

## 2017-02-17 MED ADMIN — LOSARTAN POTASSIUM 50 MG PO TABS: 100 mg | ORAL | @ 18:00:00 | Stop: 2017-02-22 | NDC 68084034701

## 2017-02-17 MED ADMIN — PIPERACILLIN-TAZOBACTAM 3.375 GM/50 ML RTU (EXT 4HR): 3.375 g | INTRAVENOUS | @ 14:00:00 | Stop: 2017-02-18 | NDC 00206886102

## 2017-02-17 MED ADMIN — PIPERACILLIN-TAZOBACTAM 3.375 GM/50 ML RTU (EXT 4HR): 3.375 g | INTRAVENOUS | @ 08:00:00 | Stop: 2017-02-18 | NDC 00206886102

## 2017-02-17 NOTE — Consults
Discussed during IDR- plan for d/c to home w/ HH vs SNF (only one accepting [facility]).   Possible d/c over the weekend.    4:23 PM Spoke with pt via phone, states he still does not remember vendor info for oxygen but his wife would know.     Endorsed to weekend CM for ff. up.   For any weekend CM needs please page weekend CM p [phone].      Summary of d/c needs:     HH order- PENDING  Oxygen- has established O2 at home but need to verify with pt and spouse that all equipment available and ready upon d/c.       Leslee Home CM, RN BSN  Medicine Team D  P: [phone]

## 2017-02-17 NOTE — Progress Notes
MEDICINE ACCEPTANCE NOTE       DATE OF ADMISSION: 02/01/17  TRANSFER TO ICU: 02/08/17  TRANSFER TO MEDICINE: 02/17/2017   HOSPITAL DAY: 16     CHIEF COMPLAINT:   Chief Complaint   Patient presents with   ??? Shortness of Breath     dx with pneumonia at OSH on antibiotics with clear withe phelgm Denies fever.      Last 24 hours/overnight events    - no acute events overnight, vss, afebrile   - oxygen saturation adequate on 3L NC   - BP continues to be elevated in 160s-180s  - net -1.5L    Today  - patient had episode of desaturation to SpO2 80%on NC placed on simple mask 10L  - RT at bedside  - restarted IV furosemide 40 BID     Medications     Scheduled:    albuterol 2.5 mg Nebulization Q4H   budesonide-formoterol 2 puff Inhalation BID   busPIRone 15 mg Oral BID   carvedilol 25 mg Oral BID   docusate 100 mg Oral BID   DULoxetine 60 mg Oral Daily   enoxaparin 1 mg/kg Subcutaneous BID   furosemide 40 mg Intravenous BID (diuretic)   gabapentin 800 mg Oral TID   ipratropium 0.5 mg Nebulization Q4H   losartan 100 mg Oral Daily   methylPREDNISolone IV 40 mg Intravenous Daily   NIFEdipine 30 mg Oral Daily   pantoprazole 40 mg Oral Daily   piperacillin/tazobactam 3.375 g Intravenous Q8H   polyethylene glycol 17 g Oral Daily   pravastatin 40 mg Oral QHS   pyridoxine 100 mg Oral Daily   senna 1 tablet Oral QHS   vancomycin 1.25 g Intravenous Q12H      Continuous:  ??? sodium chloride 5 mL/hr (02/13/17 1203)      PRN:  insulin aspart **AND** dextrose, diphenhydrAMINE, hydrALAZINE, HYDROcodone-acetaminophen, LORazepam      Physical Exam   Temp:  [36 ???C (96.8 ???F)-36.8 ???C (98.2 ???F)] 36 ???C (96.8 ???F)  Heart Rate:  [72-85] 73  Resp:  [12-26] 12  BP: (156-185)/(74-89) 173/74  NBP Mean:  [99-115] 99  SpO2:  [80 %-100 %] 93 %   UOP: Urine:  [300 mL-950 mL] 300 mL I/Os:     Weights:   104,1 kg (02/16/17)  104.9 kg (02/15/17)  100.6 kg (02/14/17)  103.2 kg (02/13/17)  102.1 kg (02/12/17)  102.9 kg (02/11/17)  103.4 kg (02/01/17) Intake/Output Summary (Last 24 hours) at 02/17/17 1325  Last data filed at 02/17/17 1100   Gross per 24 hour   Intake             1250 ml   Output             3925 ml   Net            -2675 ml     Gen: 76 yo [name], pleasant, in no acute distress, appears comfortable, sitting up in bed, NC in plac  CV: RRR, no murmurs   CV: improved air movement, crackles bilaterally,normal work of breathing  Abd: NABS, soft, flat, NT, no masses or organomegaly  Skin: Warm and dry, no rashes  Ext: improvement in lower extremity edema bilaterally   Neuro: A&Ox4, CNII-XII intact, motor 5/5 throughout, sensation grossly intact    Laboratory Data   CBC:  Recent Labs      02/17/17   0534  02/16/17   0650  02/15/17   0522   WBC  10.98*  10.78*  9.54   NEUTABS  7.52*  7.30*  7.36*   HGB  13.2*  12.8*  12.5*   HCT  40.1  40.2  38.4*   MCV  89.3  88.2  89.3   PLT  276  297  283     BMP:  Recent Labs      02/17/17   0534  02/16/17   0650  02/15/17   0522   NA  138  137  134*   K  4.9  4.5  4.8   CL  98  96  94*   CO2  28  30  29    BUN  16  17  23*   CREAT  0.85  0.89  0.91   GLUCOSE  159*  158*  215*   CALCIUM  9.2  9.0  8.6   MG  1.7  1.7  1.7     02/16/2017 06:51  pH: 7.39  pCO2: 51  pO2: 47  Bicarbonate: 30.3  Base Excess: 5  O2 Sat/Measured: 79.6  Inspired O2: 3L    Microbiology   Resp cx (bacterial) 02/13/17: prelim - normal respiratory tract flora isolated   Resp cx (fungal) 02/13/17: candida   12/13 BAL:    AFB NTD    Bact NTD    Fungal: Candida    GS: negative    Biofire: negative  Aspergillus Fumigatus 02/05/17: <0.10  Cryptococcal Ag blood 02/06/17: negative  Bacterial Culture Respiratory 02/01/17: Few Probable candida albicans , Aspergillus [place]???   MRSA nares 02/01/17: No Methicillin-resistant Staphylococcus aureus isolated.   Sputum screen 02/01/17: <10 Epithelial cells per low power field   Resp bacterial Cx 02/01/17: <10 Epithelial cells per low power field   Legionella urine 02/01/17: negative     Pathology Respiratory cytology 02/08/17  FINAL DIAGNOSIS  Negative for malignant cells    Imaging   CT Chest w/ contrast 02/15/17  1.   Redemonstration of coalescent airspace and nodular consolidation, most prominent in the left upper lobe and lingula with peripheral groundglass associated with Unchanged to slightly more prominent mediastinal and bilateral hilar lymphadenopathy.   This finding is nonspecific but concerning for malignancy versus pneumonia with etiologies including fungal infection.   2.  Increase in size of pleural and pericardial effusions and anasarca consistent with volume overload.  3.   Dilated main pulmonary artery measuring 39 mm reflecting pulmonary arterial hypertension.  4.  Severe coronary artery calcifications.    CT Abd/Pelvis w/contrast 02/15/17  History of prostate cancer treated with total prostatectomy with at least 4 hypervascular hepatic masses measuring up to 5 cm in the right hepatic lobe, suspicious for metastatic disease. No abdominal or pelvic lymphadenopathy. Severe atherosclerotic disease with thrombosed left common, external, and femoral artery, and patent femorofemoral bypass.  ???  Assessment/Plan   Ryan Shepard???is a 76 y.o.???male???with history of COPD (on baseline 3L), grade I diastolic dysfunction, atrial fibrillation (on apixaban),???HTN, DM2, PAD s/p femoral graft, GERD admitted to [facility] ???12/6 for dyspnea with LUL consolidation; transferred to MICU 12/13 for worsening mixed hypoxemic/hypercapneic respiratory failure.  ???  #liver lesions on imaging  CT A/P revealed hx prostate cancer treated with total prostatectomy with at least 4 hypervascular hepatic masses measuring up to 5 cm in the right hepatic lobe, suspicious for metastatic disease. No abdominal or pelvic lymphadenopathy. Hx of prostate cancer.  - plan for outpatient IR biopsy for liver lesions     #Mixed hypoxemic/hypercapneic respiratory failure  ???Acute  hypoxemia with increasing O2 requirement and hypercapnia following bronchoscopy. Highest concern for post-procedural inflammation/bronchoconstriction, however differential also includes acute bacterial pneumonia including aspiration in setting of sedation. Initial concern for ???pulmonary aspergillosis given???positive sputum???cultures on admission, however BAL cultures remain negative (+candida only, likely colonizer) with negative galactomanan less suggestive of pathogenic infection. Fungal infection and malignancy remain on differential for chronic respiratory symptoms with multifocal consolidations and intrathoracic lymphadenopathy. s/p vanc (12/14), pip-tazo (12/14-12/15), s/p voriconazole (12/14-16); held per ID recs given BAL cultures NTD, s/p IV Ceftriaxone and metronidazole (12/15-20)  - Pulm consulted, appreciate rec's:  - 12/22 - episode of desaturation and placed on 10L simple mask   - continue IV Vancomycin (per pharmacy) and IV piperacillin/tazobactam 3.375 g q8h (12/18-  - continue IV methylprednisolone 40 mg daily x 5 days for  COPD exacerbation/airway inflammation post bronch (12/18-  - follow repeat sputum cultures from 02/13/18, NGTD  - respiratory cytology 12/13 negative for malignant cells  - regarding concern for malignancy, no longer planning on EBUS --> plan for outpatient biopsy of liver lesions   - diurese for euvolemia or -500   - ???bipap at night, daily VBG   -???scheduled albuterol/ipratropium nebs???q4h  - budesonide-formoterol 160-4.5 mcg 2 puffs BID   - encourage incentive spirometry   - plan for discharge on triple therapy (LABA/LAMA/ICS) --> submit PA for Trelegy Ellipta 1 puff daily   - pleural effusions on CT chest visualized, pulm following for bedside U/S 02/16/17 not enough pleural fluid for thoracentesis   ??????  #HFpEF  Grade I diastolic dysfunction per 01/2017 TTE, EF 70%. Evidence of pulmonary edema on CXR following MICU transfer. Appears on more euvolemic on exam today.   - restarted IV furosemide 40 mg BID -???goal net > -500 cc (net neg -300 yesterday)  - daily weights   - strict ins/outs     #AKI    Cr down to 0.85 today, likely 2/2 increase in diuresis  ???  #Afib  Rate-controlled  - on continuous cardiac monitoring   - carvedilol 25 mg mg???BID    - enoxaparan 100 mg BID   ???  #Intrathoracic LAD  On clarification, there is no well defined pulmonary mass, rather, ''Coalescent airspace and nodular consolidation within the???left upper lobe and lingula,???and to a lesser extent, within the right upper and right lower lobes, consistent with pneumonia, likely fungal. Associated bulky intrathoracic multicompartmental lymphadenopathy'' on CT scan. There is some c/f malignancy given the LAD, am pulm is considering EBUS, patient reports remote history of prostate cancer s/p surgery and chemotherapy around 2000.   - requested records from [facility]    #HTN  SBP 160s-180s, on methylprednisolone and scheduled duonebs that may be contributing to HTN, d/c hydralazine 50 mg TID which showed no improvement in BP  - losartan 100 mg daily   - carvedilol25 mg BID    - start nifedipine ER 30 mg daily (can uptitrate)   ???  #DM2   Blood glucose at goal   - insulin sliding scale algorithm #3  - gabapentin 800 mg ???TID   - consider restarting home oral metformin upon discharge   ???  #PAD  s/p femoral-femoral graft 2009.  - holding clopidogrel 75 mg daily for EBUS vs thoracentesis vs biopsy   ???  #HLD chronic/stable  - pravastatin 40 mg nightly   ???  #GERD chronic/stable  - pantoprazole 40 mg daily   ???  #depression/ anxiety  Mood stable, no SI, no anxiety  - buspirone 15 mg  BID  - duloxetine DR 60   - lorazepam 2 mg PRN BID  ???  #chronic pain. Localized to lower back and lower extremities  - norco 5-325 q6h PRN  - schedule outpatient pain clinic follow up   ???  #genital HSV (resolved)   Throughout admission patient was noted to have active flare. 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   - s/p acyclovir 400mg  po TID (12/10-12/16)    Inpatient Checklist Diet: Diet 2 gram sodium  Diet carbohydrate controlled  DVT Prophylaxis: apixaban  GI Prophylaxis: PPI  Central Lines: None???  Tubes/Drains: None???  ???  Code Status:???  Full Code    Contact: ???Primary Emergency Contact: Potempa,Johnnie, Home Phone: [phone]  ???  DWA Dr. Jackelyn Hoehn     Author:  Bascom Levels, MD  [phone]  02/17/2017 1:25 PM    Attending Addendum:  Date of Service: 02/17/2017    I saw and evaluated Ryan Shepard. I discussed the case with the resident and agree with the findings and plan of care as documented in the resident's note above. I personally reviewed the interval physician notes, nursing notes, and allied health professional notes, imaging, labs and microbiologic information over the last 24 hours.    A/P:  Larey Seat behind on diuresis yesterday, cont. w IV lasix today.     Greater than 50% of a 35 minute encounter was spent on direct patient care activities, counseling of the patient and/or family, and coordination of care for the problems discussed in my note, as well as: discussion of care plan and management options, discussion with other treating/consulting physicians.    Gurfateh Mcclain L. Jackelyn Hoehn, MD  02/18/2017 1:32 PM  Clinical Instructor, [facility] Internal Medicine

## 2017-02-17 NOTE — Other
Patients Clinical Goal:   Clinical Goal(s) for the Shift: VS will remain stable  Identify possible barriers to advancing the care plan:    Stability of the patient: Moderately Unstable - medium risk of patient condition declining or worsening    End of Shift Summary:  Pt alert and oriented x 4. BP in the 180's, 170's and 160's, asymptomatic and BP medications given; SR on the monitor with HR in the 80's and occasional PVC's. Saturating >93% on 3 liters via NC with no complains of SOB. Pain managed with prn Norco administered twice during the shift. Tolerating IV ABX without any issues. Slept well, all needs attended to and will continue to monitor.

## 2017-02-17 NOTE — Consults
Pharmaceutical Services ??? Vancomycin Dosing (Ongoing)    Patient Name: Ryan Shepard  MRN: [id number]  Age: 76 y.o.  Sex: male    Vancomycin per Pharmacy Consult Order     Start     Ordered    02/13/17 1021  vancomycin per pharmacy  Per Protocol     Question Answer Comment   Indication Pneumonia    Goal Trough Serum Level 15-20 mcg/mL    Has patient received a dose of this drug within the past 72 hours? No        02/13/17 1022        Vital Signs/Other Objective Data  Allergies: Patient has no known allergies.    BP 185/83  ~ Pulse 84  ~ Temp 36.4 ???C (97.5 ???F) (Oral)  ~ Resp 16  ~ Ht 1.854 m (6' 0.99'')  ~ Wt (!) 104.1 kg (229 lb 9.6 oz)  ~ SpO2 100%  ~ BMI 30.30 kg/m???     Intake/Output Summary (Last 24 hours) at 02/16/17 2224  Last data filed at 02/16/17 2100   Gross per 24 hour   Intake             1220 ml   Output             2850 ml   Net            -1630 ml       Medications  Med Administrations and Associated Flowsheet Values (last 72 hours)  Vancomycin administration    Date/Time Action Medication Dose Rate    02/16/17 2141 New Bag/ Syringe/ Cartridge    vancomycin 1.5 g in dextrose 5% 500 mL IVPB 1.5 g 333.33 mL/hr    02/16/17 4540 New Bag/ Syringe/ Cartridge    vancomycin 1.5 g in dextrose 5% 500 mL IVPB 1.5 g 333.33 mL/hr    02/15/17 2104 New Bag/ Syringe/ Cartridge    vancomycin 1.5 g in dextrose 5% 500 mL IVPB 1.5 g 333.33 mL/hr    02/15/17 9811 New Bag/ Syringe/ Cartridge    vancomycin 1.5 g in dextrose 5% 500 mL IVPB 1.5 g 333.33 mL/hr    02/14/17 2216 New Bag/ Syringe/ Cartridge    vancomycin 1.25 g in dextrose 5% 250 mL IVPB 1.25 g 250 mL/hr    02/14/17 1038 New Bag/ Syringe/ Cartridge    vancomycin 1.25 g in dextrose 5% 250 mL IVPB 1.25 g 250 mL/hr        Labs (most recent)  White Blood Cell Count   Date Value Ref Range Status   02/16/2017 10.78 (H) 4.16 - 9.95 x10E3/uL Final   01/25/2008 7.82 3.28 - 9.29 x10E3/uL      Urea Nitrogen   Date Value Ref Range Status   02/16/2017 17 7 - 22 mg/dL Final 91/47/8295 33 (H) 7 - 23 mg/dL Final     Creatinine   Date Value Ref Range Status   02/16/2017 0.89 0.60 - 1.30 mg/dL Final   62/13/0865 0.9 0.5 - 1.3 mg/dL      Vancomycin,trough (mcg/mL)   Date/Time Value   02/16/2017 2129 20.4 (H)       Assessment  Indication Pneumonia  Goal trough level 15-20 mcg/mL  Revised Vd 62 L  Revised k 0.065 hr-1  This patient is receiving dialysis: No    Plan  Ryan Shepard is a 76 y.o. male who has been referred to pharmacy for vancomycin dosing. The indication for vancomycin is Pneumonia. Based on the measured  vancomycin level, the dose should be changed.      Change vancomycin to 1250 mg IV q12h     Pharmacy will continue to monitor the patient's clinical progress. The next vancomycin trough is scheduled for 02/18/17 at 22:00.    Collene Mares, PharmD, 02/16/2017, 10:24 PM

## 2017-02-18 LAB — Basic Metabolic Panel: GFR ESTIMATE FOR NON-AFRICAN AMERICAN: 84 mmol/L (ref 135–146)

## 2017-02-18 LAB — Differential Automated: ABSOLUTE EOS COUNT: 0.07 10*3/uL (ref 0.00–0.50)

## 2017-02-18 LAB — Blood Gases,venous: BICARBONATE: 31.2 mmol/L

## 2017-02-18 LAB — CBC: MEAN CORPUSCULAR VOLUME: 87.1 fL (ref 79.3–98.6)

## 2017-02-18 LAB — Magnesium: MAGNESIUM: 1.5 meq/L (ref 1.4–1.9)

## 2017-02-18 LAB — Glucose,POC
GLUCOSE,POC: 317 mg/dL — ABNORMAL HIGH (ref 65–99)
GLUCOSE,POC: 281 mg/dL — ABNORMAL HIGH (ref 65–99)
GLUCOSE,POC: 194 mg/dL — ABNORMAL HIGH (ref 65–99)
GLUCOSE,POC: 272 mg/dL — ABNORMAL HIGH (ref 65–99)

## 2017-02-18 MED ADMIN — IPRATROPIUM BROMIDE 0.02 % IN SOLN: 500 ug | RESPIRATORY_TRACT | @ 09:00:00 | Stop: 2017-02-19

## 2017-02-18 MED ADMIN — METHYLPREDNISOLONE SODIUM SUCC 40 MG IJ SOLR: 40 mg | INTRAVENOUS | @ 17:00:00 | Stop: 2017-02-18 | NDC 00009003928

## 2017-02-18 MED ADMIN — INSULIN ASPART 100 UNIT/ML SC SOPN: 3 mL | SUBCUTANEOUS | @ 17:00:00 | Stop: 2017-02-22 | NDC 00169633910

## 2017-02-18 MED ADMIN — SENNOSIDES 8.6 MG PO TABS: 1 | ORAL | @ 06:00:00 | Stop: 2017-02-21 | NDC 00904652261

## 2017-02-18 MED ADMIN — IPRATROPIUM BROMIDE 0.02 % IN SOLN: 500 ug | RESPIRATORY_TRACT | @ 06:00:00 | Stop: 2017-02-19 | NDC 00487980101

## 2017-02-18 MED ADMIN — HYDROCODONE-ACETAMINOPHEN 5-325 MG PO TABS: 2 | ORAL | @ 20:00:00 | Stop: 2017-02-22 | NDC 00406012362

## 2017-02-18 MED ADMIN — POLYETHYLENE GLYCOL 3350 17 G PO PACK: 17 g | ORAL | @ 17:00:00 | Stop: 2017-02-22

## 2017-02-18 MED ADMIN — FUROSEMIDE 10 MG/ML IJ SOLN: 40 mg | INTRAVENOUS | @ 17:00:00 | Stop: 2017-02-18 | NDC 36000028325

## 2017-02-18 MED ADMIN — CARVEDILOL 25 MG PO TABS: 25 mg | ORAL | @ 17:00:00 | Stop: 2017-02-22 | NDC 00904630361

## 2017-02-18 MED ADMIN — PYRIDOXINE HCL 50 MG PO TABS: 100 mg | ORAL | @ 17:00:00 | Stop: 2017-02-22 | NDC 00536440801

## 2017-02-18 MED ADMIN — FUROSEMIDE 10 MG/ML IJ SOLN: 40 mg | INTRAVENOUS | @ 02:00:00 | Stop: 2017-02-18 | NDC 36000028325

## 2017-02-18 MED ADMIN — ALBUTEROL SULFATE (5 MG/ML) 0.5% IN NEBU: 2.5 mg | RESPIRATORY_TRACT | @ 09:00:00 | Stop: 2017-02-19

## 2017-02-18 MED ADMIN — LOSARTAN POTASSIUM 50 MG PO TABS: 100 mg | ORAL | @ 17:00:00 | Stop: 2017-02-22 | NDC 68084034701

## 2017-02-18 MED ADMIN — IPRATROPIUM BROMIDE 0.02 % IN SOLN: 500 ug | RESPIRATORY_TRACT | @ 01:00:00 | Stop: 2017-02-19 | NDC 00487980101

## 2017-02-18 MED ADMIN — PIPERACILLIN-TAZOBACTAM 3.375 GM/50 ML RTU (EXT 4HR): 3.375 g | INTRAVENOUS | @ 08:00:00 | Stop: 2017-02-18 | NDC 00206886102

## 2017-02-18 MED ADMIN — ALBUTEROL SULFATE (5 MG/ML) 0.5% IN NEBU: 2.5 mg | RESPIRATORY_TRACT | @ 17:00:00 | Stop: 2017-02-19 | NDC 00487990130

## 2017-02-18 MED ADMIN — GABAPENTIN 400 MG PO CAPS: 800 mg | ORAL | @ 14:00:00 | Stop: 2017-02-22 | NDC 68084077401

## 2017-02-18 MED ADMIN — HYDROCODONE-ACETAMINOPHEN 5-325 MG PO TABS: 2 | ORAL | @ 03:00:00 | Stop: 2017-02-22 | NDC 00406012362

## 2017-02-18 MED ADMIN — ALBUTEROL SULFATE (5 MG/ML) 0.5% IN NEBU: 2.5 mg | RESPIRATORY_TRACT | @ 19:00:00 | Stop: 2017-02-19 | NDC 00487990130

## 2017-02-18 MED ADMIN — IPRATROPIUM BROMIDE 0.02 % IN SOLN: 500 ug | RESPIRATORY_TRACT | @ 19:00:00 | Stop: 2017-02-19

## 2017-02-18 MED ADMIN — HYDROCODONE-ACETAMINOPHEN 5-325 MG PO TABS: 2 | ORAL | @ 09:00:00 | Stop: 2017-02-22 | NDC 00406012362

## 2017-02-18 MED ADMIN — DOCUSATE SODIUM 100 MG PO CAPS: 100 mg | ORAL | @ 06:00:00 | Stop: 2017-02-22 | NDC 00904645561

## 2017-02-18 MED ADMIN — DOCUSATE SODIUM 100 MG PO CAPS: 100 mg | ORAL | @ 17:00:00 | Stop: 2017-02-22 | NDC 00904645561

## 2017-02-18 MED ADMIN — NIFEDIPINE ER OSMOTIC RELEASE 30 MG PO TB24: 30 mg | ORAL | @ 17:00:00 | Stop: 2017-02-22 | NDC 50268059715

## 2017-02-18 MED ADMIN — GABAPENTIN 400 MG PO CAPS: 800 mg | ORAL | @ 20:00:00 | Stop: 2017-02-22 | NDC 68084077401

## 2017-02-18 MED ADMIN — CARVEDILOL 25 MG PO TABS: 25 mg | ORAL | @ 06:00:00 | Stop: 2017-02-22 | NDC 00904630361

## 2017-02-18 MED ADMIN — BUDESONIDE-FORMOTEROL FUMARATE 160-4.5 MCG/ACT IN AERO: 2 | RESPIRATORY_TRACT | @ 17:00:00 | Stop: 2017-02-22 | NDC 00186037028

## 2017-02-18 MED ADMIN — ENOXAPARIN SODIUM 100 MG/ML SC SOLN: 100 mg | SUBCUTANEOUS | @ 06:00:00 | Stop: 2017-02-19 | NDC 63323056884

## 2017-02-18 MED ADMIN — DULOXETINE HCL 60 MG PO CPEP: 60 mg | ORAL | @ 17:00:00 | Stop: 2017-02-22 | NDC 00904645461

## 2017-02-18 MED ADMIN — GABAPENTIN 400 MG PO CAPS: 800 mg | ORAL | @ 06:00:00 | Stop: 2017-02-22 | NDC 68084077401

## 2017-02-18 MED ADMIN — PIPERACILLIN-TAZOBACTAM 3.375 GM/50 ML RTU (EXT 4HR): 3.375 g | INTRAVENOUS | @ 14:00:00 | Stop: 2017-02-18 | NDC 00206886102

## 2017-02-18 MED ADMIN — IPRATROPIUM BROMIDE 0.02 % IN SOLN: 500 ug | RESPIRATORY_TRACT | @ 17:00:00 | Stop: 2017-02-19

## 2017-02-18 MED ADMIN — INSULIN ASPART 100 UNIT/ML SC SOPN: 3 mL | SUBCUTANEOUS | @ 01:00:00 | Stop: 2017-02-22 | NDC 00169633910

## 2017-02-18 MED ADMIN — PANTOPRAZOLE SODIUM 40 MG PO TBEC: 40 mg | ORAL | @ 17:00:00 | Stop: 2017-02-22 | NDC 68084081309

## 2017-02-18 MED ADMIN — ENOXAPARIN SODIUM 100 MG/ML SC SOLN: 100 mg | SUBCUTANEOUS | @ 17:00:00 | Stop: 2017-02-19 | NDC 63323056884

## 2017-02-18 MED ADMIN — BUDESONIDE-FORMOTEROL FUMARATE 160-4.5 MCG/ACT IN AERO: 2 | RESPIRATORY_TRACT | @ 06:00:00 | Stop: 2017-02-22 | NDC 00186037028

## 2017-02-18 MED ADMIN — HYDROCODONE-ACETAMINOPHEN 5-325 MG PO TABS: 2 | ORAL | @ 15:00:00 | Stop: 2017-02-22 | NDC 00406012362

## 2017-02-18 MED ADMIN — IPRATROPIUM BROMIDE 0.02 % IN SOLN: 500 ug | RESPIRATORY_TRACT | @ 14:00:00 | Stop: 2017-02-19

## 2017-02-18 MED ADMIN — INSULIN ASPART 100 UNIT/ML SC SOPN: 3 mL | SUBCUTANEOUS | @ 21:00:00 | Stop: 2017-02-22 | NDC 00169633910

## 2017-02-18 MED ADMIN — PRAVASTATIN SODIUM 40 MG PO TABS: 40 mg | ORAL | @ 06:00:00 | Stop: 2017-02-22 | NDC 51079078220

## 2017-02-18 MED ADMIN — VANCOMYCIN 250 ML IVPB: 1.25 g | INTRAVENOUS | @ 06:00:00 | Stop: 2017-02-18 | NDC 47781059791

## 2017-02-18 MED ADMIN — BUSPIRONE HCL 5 MG PO TABS: 15 mg | ORAL | @ 06:00:00 | Stop: 2017-02-21 | NDC 16729020001

## 2017-02-18 MED ADMIN — ALBUTEROL SULFATE (5 MG/ML) 0.5% IN NEBU: 2.5 mg | RESPIRATORY_TRACT | @ 01:00:00 | Stop: 2017-02-19 | NDC 00487990130

## 2017-02-18 MED ADMIN — INSULIN ASPART 100 UNIT/ML SC SOPN: 3 mL | SUBCUTANEOUS | @ 06:00:00 | Stop: 2017-02-22 | NDC 00169633910

## 2017-02-18 MED ADMIN — ALBUTEROL SULFATE (5 MG/ML) 0.5% IN NEBU: 2.5 mg | RESPIRATORY_TRACT | @ 14:00:00 | Stop: 2017-02-19

## 2017-02-18 MED ADMIN — ALBUTEROL SULFATE (5 MG/ML) 0.5% IN NEBU: 2.5 mg | RESPIRATORY_TRACT | @ 06:00:00 | Stop: 2017-02-19 | NDC 00487990130

## 2017-02-18 MED ADMIN — BUSPIRONE HCL 5 MG PO TABS: 15 mg | ORAL | @ 17:00:00 | Stop: 2017-02-21 | NDC 16729020001

## 2017-02-18 NOTE — Progress Notes
NUTRITION IN-DEPTH SCREEN (Adult)    Admit Date: 02/01/2017     Date of Birth: 1940-03-15 Gender: male MRN: [id number]     Date of Screening 02/18/2017   Subjective: Chart reviewed,pt visited. He said he is eating ok but he gets tired.  Suggested snack in pm and he agreed.   Problems: Active Problems:    Lower back pain POA: Yes    OA (osteoarthritis) of knee POA: Yes    Primary osteoarthritis of knees, bilateral POA: Yes    COPD exacerbation (HCC/RAF) POA: Yes    COPD (chronic obstructive pulmonary disease) (HCC/RAF) POA: Yes    Chronic prescription opiate use POA: Not Applicable    Diabetes (HCC/RAF) POA: Yes    PAD (peripheral artery disease) (HCC/RAF) POA: Yes    Hypertension POA: Yes    Hypercholesteremia POA: Yes    GERD (gastroesophageal reflux disease) POA: Yes    Pneumonia POA: Unknown    Lower extremity edema POA: Unknown    Memory loss POA: Unknown    Genital herpes POA: Clinically Undetermined    Acute and chronic respiratory failure with hypoxia (HCC/RAF) POA: Yes       Past Medical History:   Diagnosis Date   ??? Cancer (HCC/RAF)    ??? Diabetes (HCC/RAF)    ??? GERD (gastroesophageal reflux disease)    ??? Hypercholesteremia    ??? Hypertension    ??? PAD (peripheral artery disease) (HCC/RAF)    ??? 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 Prostate disease    ??? Scoliosis     adolesent   ??? Vascular disease     Past Surgical History:   Procedure Laterality Date   ??? BACK SURGERY     ??? BILATERAL FEMORAL ARTERY EXPLORATION  2009   ??? BLADDER SURGERY  1999   ??? PROSTATE SURGERY  2000         Anthropometrics     Admit  Height: 185.4 cm (6' 0.99'') (Stated)  Weight: (!) 103.4 kg (228 lb) Most Recent  Height: 185.4 cm (6' 0.99'')  Weight: (!) 103 kg (227 lb)  IBW: 81 kg (178 lb 9.2 oz)  Usual Weight: 113.4 kg (250 lb)           BMI (Calculated): 30    % Ideal Body Weight: 128 %  % Usual Weight: 92 %        Allergies   Patient has no known allergies.     Cultural / Religious / Ethnic Food Preferences None       Nutrition Prior to Admission   Per pt, follows a low carb diet at home.     Nutrition Risk Factors   Moderate Nutrition Risk Factors:  (COPD exacerbation )     Acuity Level: 2-Moderate risk     Diet Orders     Diets/Supplements/Feeds   Diet    Diet carbohydrate controlled     Start Date/Time: 02/09/17 1330     Number of Occurrences:  Until Specified        Impression   PO % consumed: 51 to 75%  Impression:  (He said his appetite is OK but sometimes he gets tired )     Diet Education   Pt declines, Pt previously educated      FDI Target Drugs: No          Nutrition Care Plan   Plan: Continue with diet as ordered, Trend weights, Monitor adequacy of intake (  added 1/2 s/wich in pm for snack)  Comments: Continue to follow.    Next Follow-up by 02/25/17    Author:  Barth Kirks A. Zayda Angell, DTR, pager [phone]  02/18/2017 11:18 AM

## 2017-02-18 NOTE — Progress Notes
General Medicine Progress Note    Patient Name: Ryan Shepard  MRN: [id number]  DOB: April 03, 1940  PMD: Ryan Mylar, MD  Date of Admission: 02/01/2017    Date of Service: 02/18/2017  Hospital Day: 17     Chief Complaint: SOB      Subjective:   24-hour Course/Interval Events:   - Increased O2 requirement w some increased WOB in afternoon, restarted Diuresis, weaned down from 10L facemask --> 5L NC, gave bronchodilator therapy.    Subjective:  Feeling better today, breathing is improved. No new GI, cardiac, or pulmonary complaints.      Objective:   Vital Signs:  Temp:  [36.5 ???C (97.7 ???F)-36.9 ???C (98.4 ???F)] 36.8 ???C (98.2 ???F)  Heart Rate:  [71-110] 83  Resp:  [15-25] 25  BP: (132-162)/(73-88) 158/74  NBP Mean:  [95-106] 96  SpO2:  [86 %-94 %] 93 %      Intake/Output Summary (Last 24 hours) at 02/18/17 1407  Last data filed at 02/18/17 1327   Gross per 24 hour   Intake             1790 ml   Output             4900 ml   Net            -3110 ml       Physical Exam:  Gen: no acute distress, appears comfortable, sitting up in bed, NC in plac  HEENT: mmm, anicteric sclerae  CV: RRR, no murmurs   Pulm: improved air movement, crackles bilaterally, normal work of breathing  Abd: NABS, soft, flat, NT, no masses or organomegaly  Skin: Warm and dry, no rashes  Ext: improvement in lower extremity edema bilaterally, now 1+  Neuro: A&Ox4, CNII-XII intact, motor 5/5 throughout, sensation grossly intact    Medications:  Scheduled:   ??? albuterol  2.5 mg Nebulization Q4H   ??? budesonide-formoterol  2 puff Inhalation BID   ??? busPIRone  15 mg Oral BID   ??? carvedilol  25 mg Oral BID   ??? docusate  100 mg Oral BID   ??? DULoxetine  60 mg Oral Daily   ??? enoxaparin  1 mg/kg Subcutaneous BID   ??? furosemide  40 mg Intravenous BID (diuretic)   ??? gabapentin  800 mg Oral TID   ??? ipratropium  0.5 mg Nebulization Q4H   ??? losartan  100 mg Oral Daily   ??? NIFEdipine  30 mg Oral Daily   ??? pantoprazole  40 mg Oral Daily   ??? polyethylene glycol  17 g Oral Daily ??? pravastatin  40 mg Oral QHS   ??? pyridoxine  100 mg Oral Daily   ??? senna  1 tablet Oral QHS     Continuous Transfusions:   ??? sodium chloride 5 mL/hr (02/13/17 1203)     PRN: insulin aspart **AND** dextrose, diphenhydrAMINE, hydrALAZINE, HYDROcodone-acetaminophen, LORazepam    Labs:  CBC: WBC/Hgb/Hct/Plts:  12.42/12.7/39.3/273 (12/23 0556)  Chemistry:Na/K/Cl/CO2/BUN/Cr/glu:  134/4.6/93/28/19/0.89/201 (12/23 0556)    POC Glucose:   Recent Labs      02/18/17   1200  02/18/17   0757  02/17/17   2208  02/17/17   1707   GLUCOSEPOC  272*  194*  281*  317*       Microbiology: Reviewed. Pertinent data:  Resp cx (fungal) 02/13/17: candida   12/13 BAL:  ??????AFB NTD  ??????Bact NTD  ??????Fungal: Candida  ??????GS: negative  ??????Biofire: negative  Aspergillus Fumigatus 02/05/17: <0.10  Cryptococcal Ag blood 02/06/17: negative  Bacterial Culture Respiratory 02/01/17: Few Probable candida albicans , Aspergillus [place]???     Imaging: Reviewed.  CT Chest w/ contrast 02/15/17  1. ??????Redemonstration of coalescent airspace and nodular consolidation, most prominent in the left upper lobe and lingula with peripheral groundglass associated with Unchanged to slightly more prominent mediastinal and bilateral hilar lymphadenopathy.   This finding is nonspecific but concerning for malignancy versus pneumonia with etiologies including fungal infection.   2. ???Increase in size of pleural and pericardial effusions and anasarca consistent with volume overload.  3. ??????Dilated main pulmonary artery measuring 39 mm reflecting pulmonary arterial hypertension.  4. ???Severe coronary artery calcifications.  ???  CT Abd/Pelvis w/contrast 02/15/17  History of prostate cancer treated with total prostatectomy with at least 4 hypervascular hepatic masses measuring up to 5 cm in the right hepatic lobe, suspicious for metastatic disease. No abdominal or pelvic lymphadenopathy. Severe atherosclerotic disease with thrombosed left common, external, and femoral artery, and patent femorofemoral bypass.     Assessment and Plan:   Ryan Shepard is a 76 y.o. 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    #Acute on Chronic Mixed hypoxemic/hypercapneic respiratory failure - Acute hypoxemia with increasing O2 requirement and hypercapnia, worsened after bronchoscopy. Infectious workup thus far has not revealed culprit pathogen. 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 Has consolidation w mediastinal LAD, malignancy is on DDx (see above). Diastolic HF also contributing.  - Pulm consulted, appreciate rec's:  - s/p vanc (12/14), pip-tazo???(12/14-12/15), s/p voriconazole (12/14-16); held per ID recs given BAL cultures NTD, s/p IV Ceftriaxone and metronidazole (12/15-20)  - s/p IV Vancomycin (per pharmacy) and IV piperacillin/tazobactam 3.375 g q8h (12/18-12/23), completed 14 day course of abx  - continue IV methylprednisolone 40 mg daily x 5 days for  COPD exacerbation/airway inflammation post bronch (12/18-12/23)  - ???bipap at night, daily VBG   -???scheduled albuterol/ipratropium nebs???q4h  - budesonide-formoterol 160-4.5 mcg 2 puffs BID   - encourage incentive spirometry   - plan for discharge on triple therapy (LABA/LAMA/ICS) --> submit PA for Trelegy Ellipta 1 puff daily   - pleural effusions on CT chest visualized, not amenable for thoracentesis  - diuresis below  ??????  #Chronic Diastolic Heart Failure, POA  Grade I diastolic dysfunction per 01/2017 TTE, EF 70%. Episode of increasing O2 requirement on 12/22, suspicious for continued fluid overload based on review of recent CT scan.  - furosemide 40mg  IV BID  - cont diuresis, goal -1.5L  - daily weights   - strict ins/outs   ??? #Hypervascular Liver Lesions/Mediastinal LAD- c/f malignancy, hx prostate cancer treated with total prostatectomy.  - plan for outpatient IR biopsy for liver lesions, as these are likely the most amenable for biopsy  - EBUS considered, but ultimately liver bx favored given poor post-op course from last bronchoscopy given underlying pulmonary disease     #AKI, resolved.  - monitor GFR w diuresis  - avoid nephrotoxins  ???  #Afib  Rate-controlled  - carvedilol 25 mg mg???BID    - enoxaparin 100 mg BID   ???  #HTN  SBP 160s-180s, on methylprednisolone and scheduled duonebs that may be contributing to HTN, d/c hydralazine 50 mg TID which showed no improvement in BP  - losartan 100 mg daily   -  carvedilol25 mg BID    - cont nifedipine ER 30 mg daily  - next agent would be thiazide diuretic  ???  #DM2 with associated diabetic peripheral neuropathy  Blood glucose at goal   - insulin sliding scale algorithm #3  - gabapentin 800 mg ???TID   ???  #PAD  s/p femoral-femoral graft 2009.  - holding clopidogrel 75 mg daily in anticipation of biopsy to work up LAD/Liver lesions  ???  #HLD chronic/stable  - pravastatin 40 mg nightly   ???  #GERD chronic/stable  - pantoprazole 40 mg daily   ???  #depression/ anxiety  Mood stable, no SI, no anxiety  - buspirone 15 mg BID  - duloxetine DR 60   - lorazepam 2 mg PRN BID  ???  #chronic???pain. Localized to lower back and lower extremities  - norco 5-325 q6h PRN  - schedule outpatient pain clinic follow up   ???  #genital HSV (resolved)   Throughout admission patient was noted to have active flare. 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   - s/p acyclovir 400mg  po TID (12/10-12/16)    Inpatient Checklist  Diet: Diet 2 gram sodium  Diet carbohydrate controlled  DVT Prophylaxis: therapeutic enoxaparin  GI Prophylaxis: not indicated  Central Lines: None Day:0  Tubes/Drains: None Day:0    CODE STATUS: Full Code  CONTACT: Extended Emergency Contact Information  Primary Emergency Contact: Engelmann,Johnnie Address: [address]           [place], [place] [address] [place]  Home Phone: [phone]  Mobile Phone: [phone]  Relation: Spouse  Secondary Emergency Contact: Marcina Millard  Address: [address]                          [place] [address] [place]  Home Phone: [phone]  Work Phone: [phone]  Relation: Friend    DWA Dr. Jackelyn Hoehn, Devota Pace., MD    Lincoln Brigham. Lorin Picket, M.D.  [facility] Internal Medicine PGY-3  [phone]    Attending Addendum:  Date of Service: 02/18/2017    I saw and evaluated Ryan Shepard. I discussed the case with the resident and agree with the findings and plan of care as documented in the resident's note above. I personally reviewed the interval physician notes, nursing notes, and allied health professional notes, imaging, labs and microbiologic information over the last 24 hours.    Greater than 50% of a 35 minute encounter was spent on direct patient care activities, counseling of the patient and/or family, and coordination of care for the problems discussed in my note, as well as: discussion of care plan and management options, discussion with other treating/consulting physicians.    Nikelle Malatesta L. Jackelyn Hoehn, MD  02/18/2017 2:50 PM  Clinical Instructor, [facility] Internal Medicine

## 2017-02-18 NOTE — Other
Patients Clinical Goal:   Clinical Goal(s) for the Shift: VS will remain stable  Identify possible barriers to advancing the care plan: none  Stability of the patient: Moderately Stable - low risk of patient condition declining or worsening   End of Shift Summary: A/OX4,afebrile, HR 78-84/min,BP this morning was 156/34mmHG,at noon BP was up to 173/32mmHG, MD ordered Procardia 30mg  given, rechecked BP was 155/6mmHG,patient desat to 80% on 4L N/C,nurses put patien ton FM 10L.;patien twas wheezing and crakles,,MD notified RT treatment given Lasix 40mg  X2 given with good urine output,sat OOB X2, had BM X1,constant C/O 10/10 generalized pain,Norco 2 tabs Q6H PRN X2 given with relief.

## 2017-02-18 NOTE — Other
Patients Clinical Goal:   Clinical Goal(s) for the Shift: pain management, stable vital signs  Identify possible barriers to advancing the care plan:   None noted.  Stability of the patient:   Moderately Stable - low risk of patient condition declining or worsening   End of Shift Summary:    Ryan Shepard was in no acute distress all night. He was medicated with Norco and neurontin for the management of his general body ache. He has been able to use the incentive spirometer. He has moved 1000 cc of air without any difficulties.

## 2017-02-19 DIAGNOSIS — I480 Paroxysmal atrial fibrillation: Secondary | ICD-10-CM

## 2017-02-19 DIAGNOSIS — E1140 Type 2 diabetes mellitus with diabetic neuropathy, unspecified: Secondary | ICD-10-CM

## 2017-02-19 DIAGNOSIS — R160 Hepatomegaly, not elsewhere classified: Secondary | ICD-10-CM

## 2017-02-19 DIAGNOSIS — I5032 Chronic diastolic (congestive) heart failure: Secondary | ICD-10-CM

## 2017-02-19 DIAGNOSIS — J449 Chronic obstructive pulmonary disease, unspecified: Secondary | ICD-10-CM

## 2017-02-19 LAB — Glucose,POC
GLUCOSE,POC: 348 mg/dL — ABNORMAL HIGH (ref 65–99)
GLUCOSE,POC: 144 mg/dL — ABNORMAL HIGH (ref 65–99)
GLUCOSE,POC: 148 mg/dL — ABNORMAL HIGH (ref 65–99)
GLUCOSE,POC: 214 mg/dL — ABNORMAL HIGH (ref 65–99)

## 2017-02-19 LAB — Basic Metabolic Panel
CALCIUM: 9.3 mg/dL (ref 8.6–10.4)
SODIUM: 137 mmol/L (ref 135–146)

## 2017-02-19 LAB — Magnesium: MAGNESIUM: 1.5 meq/L (ref 1.4–1.9)

## 2017-02-19 LAB — Blood Gases,venous: O2 SATURATION/MEASURED: 88.2

## 2017-02-19 LAB — Differential Automated: EOSINOPHIL PERCENT, AUTO: 0.8 (ref 1.30–3.40)

## 2017-02-19 LAB — CBC: HEMOGLOBIN: 12.6 g/dL — ABNORMAL LOW (ref 13.5–17.1)

## 2017-02-19 MED ADMIN — INSULIN ASPART 100 UNIT/ML SC SOPN: 3 mL | SUBCUTANEOUS | @ 20:00:00 | Stop: 2017-02-22 | NDC 00169633910

## 2017-02-19 MED ADMIN — HYDROCODONE-ACETAMINOPHEN 5-325 MG PO TABS: 2 | ORAL | @ 09:00:00 | Stop: 2017-02-22 | NDC 00406012362

## 2017-02-19 MED ADMIN — ENOXAPARIN SODIUM 100 MG/ML SC SOLN: 100 mg | SUBCUTANEOUS | @ 17:00:00 | Stop: 2017-02-19 | NDC 63323056884

## 2017-02-19 MED ADMIN — ENOXAPARIN SODIUM 100 MG/ML SC SOLN: 100 mg | SUBCUTANEOUS | @ 05:00:00 | Stop: 2017-02-19 | NDC 63323056884

## 2017-02-19 MED ADMIN — INSULIN ASPART 100 UNIT/ML SC SOPN: 3 mL | SUBCUTANEOUS | @ 17:00:00 | Stop: 2017-02-22 | NDC 00169633910

## 2017-02-19 MED ADMIN — BUDESONIDE-FORMOTEROL FUMARATE 160-4.5 MCG/ACT IN AERO: 2 | RESPIRATORY_TRACT | @ 06:00:00 | Stop: 2017-02-22 | NDC 00186037028

## 2017-02-19 MED ADMIN — CARVEDILOL 25 MG PO TABS: 25 mg | ORAL | @ 17:00:00 | Stop: 2017-02-22 | NDC 00904630361

## 2017-02-19 MED ADMIN — INSULIN ASPART 100 UNIT/ML SC SOPN: 3 mL | SUBCUTANEOUS | @ 06:00:00 | Stop: 2017-02-22 | NDC 00169633910

## 2017-02-19 MED ADMIN — IPRATROPIUM BROMIDE 0.02 % IN SOLN: 500 ug | RESPIRATORY_TRACT | @ 20:00:00 | Stop: 2017-02-22 | NDC 00487980101

## 2017-02-19 MED ADMIN — FUROSEMIDE 80 MG PO TABS: 80 mg | ORAL | @ 01:00:00 | Stop: 2017-02-21 | NDC 51079052720

## 2017-02-19 MED ADMIN — PANTOPRAZOLE SODIUM 40 MG PO TBEC: 40 mg | ORAL | @ 17:00:00 | Stop: 2017-02-22 | NDC 68084081309

## 2017-02-19 MED ADMIN — HYDROCODONE-ACETAMINOPHEN 5-325 MG PO TABS: 2 | ORAL | @ 02:00:00 | Stop: 2017-02-22 | NDC 00406012362

## 2017-02-19 MED ADMIN — BUSPIRONE HCL 5 MG PO TABS: 15 mg | ORAL | @ 05:00:00 | Stop: 2017-02-21 | NDC 16729020001

## 2017-02-19 MED ADMIN — ALBUTEROL SULFATE (5 MG/ML) 0.5% IN NEBU: 2.5 mg | RESPIRATORY_TRACT | @ 20:00:00 | Stop: 2017-02-22 | NDC 00487990130

## 2017-02-19 MED ADMIN — IPRATROPIUM BROMIDE 0.02 % IN SOLN: 500 ug | RESPIRATORY_TRACT | @ 14:00:00 | Stop: 2017-02-19

## 2017-02-19 MED ADMIN — FUROSEMIDE 80 MG PO TABS: 80 mg | ORAL | @ 17:00:00 | Stop: 2017-02-21 | NDC 51079052720

## 2017-02-19 MED ADMIN — GABAPENTIN 400 MG PO CAPS: 800 mg | ORAL | @ 20:00:00 | Stop: 2017-02-22 | NDC 68084077401

## 2017-02-19 MED ADMIN — DOCUSATE SODIUM 100 MG PO CAPS: 100 mg | ORAL | @ 05:00:00 | Stop: 2017-02-22 | NDC 00904645561

## 2017-02-19 MED ADMIN — PRAVASTATIN SODIUM 40 MG PO TABS: 40 mg | ORAL | @ 05:00:00 | Stop: 2017-02-22 | NDC 51079078220

## 2017-02-19 MED ADMIN — FUROSEMIDE 80 MG PO TABS: 80 mg | ORAL | Stop: 2017-02-21 | NDC 51079052720

## 2017-02-19 MED ADMIN — MAGNESIUM SULFATE 4 GM/100ML IV SOLN: 4 g | INTRAVENOUS | @ 16:00:00 | Stop: 2017-02-19 | NDC 63323010601

## 2017-02-19 MED ADMIN — DULOXETINE HCL 60 MG PO CPEP: 60 mg | ORAL | @ 17:00:00 | Stop: 2017-02-22 | NDC 00904645461

## 2017-02-19 MED ADMIN — IPRATROPIUM BROMIDE 0.02 % IN SOLN: 500 ug | RESPIRATORY_TRACT | @ 05:00:00 | Stop: 2017-02-19 | NDC 00487980101

## 2017-02-19 MED ADMIN — INSULIN ASPART 100 UNIT/ML SC SOPN: 3 mL | SUBCUTANEOUS | @ 02:00:00 | Stop: 2017-02-22 | NDC 00169633910

## 2017-02-19 MED ADMIN — BUDESONIDE-FORMOTEROL FUMARATE 160-4.5 MCG/ACT IN AERO: 2 | RESPIRATORY_TRACT | @ 16:00:00 | Stop: 2017-02-22 | NDC 00186037028

## 2017-02-19 MED ADMIN — ALBUTEROL SULFATE (5 MG/ML) 0.5% IN NEBU: 2.5 mg | RESPIRATORY_TRACT | @ 09:00:00 | Stop: 2017-02-19

## 2017-02-19 MED ADMIN — BUSPIRONE HCL 5 MG PO TABS: 15 mg | ORAL | @ 17:00:00 | Stop: 2017-02-21 | NDC 16729020001

## 2017-02-19 MED ADMIN — GABAPENTIN 400 MG PO CAPS: 800 mg | ORAL | @ 05:00:00 | Stop: 2017-02-22 | NDC 68084077401

## 2017-02-19 MED ADMIN — CARVEDILOL 25 MG PO TABS: 25 mg | ORAL | @ 05:00:00 | Stop: 2017-02-22 | NDC 00904630361

## 2017-02-19 MED ADMIN — IPRATROPIUM BROMIDE 0.02 % IN SOLN: 500 ug | RESPIRATORY_TRACT | @ 01:00:00 | Stop: 2017-02-19 | NDC 00487980101

## 2017-02-19 MED ADMIN — DOCUSATE SODIUM 100 MG PO CAPS: 100 mg | ORAL | @ 17:00:00 | Stop: 2017-02-22 | NDC 00904645561

## 2017-02-19 MED ADMIN — SENNOSIDES 8.6 MG PO TABS: 1 | ORAL | @ 05:00:00 | Stop: 2017-02-21 | NDC 00904652261

## 2017-02-19 MED ADMIN — PYRIDOXINE HCL 50 MG PO TABS: 100 mg | ORAL | @ 17:00:00 | Stop: 2017-02-22 | NDC 00536440801

## 2017-02-19 MED ADMIN — NIFEDIPINE ER OSMOTIC RELEASE 30 MG PO TB24: 30 mg | ORAL | @ 17:00:00 | Stop: 2017-02-22 | NDC 50268059715

## 2017-02-19 MED ADMIN — POLYETHYLENE GLYCOL 3350 17 G PO PACK: 17 g | ORAL | @ 17:00:00 | Stop: 2017-02-22

## 2017-02-19 MED ADMIN — ALBUTEROL SULFATE (5 MG/ML) 0.5% IN NEBU: 2.5 mg | RESPIRATORY_TRACT | @ 16:00:00 | Stop: 2017-02-19 | NDC 00487990130

## 2017-02-19 MED ADMIN — LOSARTAN POTASSIUM 50 MG PO TABS: 100 mg | ORAL | @ 17:00:00 | Stop: 2017-02-22 | NDC 68084034701

## 2017-02-19 MED ADMIN — HYDROCODONE-ACETAMINOPHEN 5-325 MG PO TABS: 2 | ORAL | @ 14:00:00 | Stop: 2017-02-22 | NDC 00406012362

## 2017-02-19 MED ADMIN — ALBUTEROL SULFATE (5 MG/ML) 0.5% IN NEBU: 2.5 mg | RESPIRATORY_TRACT | @ 01:00:00 | Stop: 2017-02-19 | NDC 00487990130

## 2017-02-19 MED ADMIN — GABAPENTIN 400 MG PO CAPS: 800 mg | ORAL | @ 14:00:00 | Stop: 2017-02-22 | NDC 68084077401

## 2017-02-19 MED ADMIN — ALBUTEROL SULFATE (5 MG/ML) 0.5% IN NEBU: 2.5 mg | RESPIRATORY_TRACT | @ 14:00:00 | Stop: 2017-02-19

## 2017-02-19 MED ADMIN — ALBUTEROL SULFATE (5 MG/ML) 0.5% IN NEBU: 2.5 mg | RESPIRATORY_TRACT | @ 05:00:00 | Stop: 2017-02-19 | NDC 00487990130

## 2017-02-19 MED ADMIN — HYDROCODONE-ACETAMINOPHEN 5-325 MG PO TABS: 2 | ORAL | @ 20:00:00 | Stop: 2017-02-22 | NDC 00406012362

## 2017-02-19 MED ADMIN — IPRATROPIUM BROMIDE 0.02 % IN SOLN: 500 ug | RESPIRATORY_TRACT | @ 09:00:00 | Stop: 2017-02-19

## 2017-02-19 MED ADMIN — IPRATROPIUM BROMIDE 0.02 % IN SOLN: 500 ug | RESPIRATORY_TRACT | @ 16:00:00 | Stop: 2017-02-19 | NDC 00487980101

## 2017-02-19 NOTE — Other
Patients Clinical Goal:   Clinical Goal(s) for the Shift: SaO2 >88% BS: 80-140, pain mgt, free from falls   Identify possible barriers to advancing the care plan: None  Stability of the patient: Moderately Stable - low risk of patient condition declining or worsening   End of Shift Summary: Pt alert and oriented x4. SaO2 91%-95% on NC 4L. RT every 4 hours. C/o generalized pain and headache. Given norco PO PRN x2 with some relief. VBG collected. Voids clear, yellow urine. Refuses to be weighed this am, will endorse to day RN. Will continue to monitor.

## 2017-02-19 NOTE — Progress Notes
General Medicine Progress Note    Patient Name: Ryan Shepard  MRN: [id number]  DOB: [birth year]  PMD: Wilburn Mylar, MD  Date of Admission: 02/01/2017    Date of Service: 02/19/2017  Hospital Day: 38     Chief Complaint: SOB      Subjective:   24-hour Course/Interval Events:   No acute events overnight, afebrile  On 4L NC  repleted Mg this AM    Subjective:  Patient still feeling SOB but overall improving, denies CP     Objective:   Vital Signs:  Temp:  [36.5 ???C (97.7 ???F)-36.9 ???C (98.4 ???F)] 36.9 ???C (98.4 ???F)  Heart Rate:  [75-89] 79  Resp:  [14-25] 14  BP: (133-161)/(67-77) 161/72  NBP Mean:  [80-97] 91  SpO2:  [86 %-94 %] 90 %      Intake/Output Summary (Last 24 hours) at 02/19/17 0830  Last data filed at 02/19/17 4540   Gross per 24 hour   Intake           1352.5 ml   Output             4400 ml   Net          -3047.5 ml     Physical Exam:  Gen: no acute distress, appears comfortable, sitting up in bed, NC in plac  HEENT: mmm, anicteric sclerae  CV: RRR, no murmurs   Pulm: improved air movement, crackles bilaterally, normal work of breathing  Abd: NABS, soft, flat, NT, no masses or organomegaly  Skin: Warm and dry, no rashes  Ext: improvement in lower extremity edema bilaterally, now 1+  Neuro: A&Ox4, CNII-XII intact, motor 5/5 throughout, sensation grossly intact  Psych: depressed mood, appropriate affect    Medications:  Scheduled:   ??? albuterol  2.5 mg Nebulization Q4H while awake   ??? budesonide-formoterol  2 puff Inhalation BID   ??? busPIRone  15 mg Oral BID   ??? carvedilol  25 mg Oral BID   ??? docusate  100 mg Oral BID   ??? DULoxetine  60 mg Oral Daily   ??? enoxaparin  1 mg/kg Subcutaneous BID   ??? furosemide  80 mg Oral BID (diuretic)   ??? gabapentin  800 mg Oral TID   ??? ipratropium  0.5 mg Nebulization Q4H while awake   ??? losartan  100 mg Oral Daily   ??? magnesium sulfate  4 g Intravenous Once   ??? NIFEdipine  30 mg Oral Daily   ??? pantoprazole  40 mg Oral Daily   ??? polyethylene glycol  17 g Oral Daily ??? pravastatin  40 mg Oral QHS   ??? pyridoxine  100 mg Oral Daily   ??? senna  1 tablet Oral QHS     Continuous Transfusions:   ??? sodium chloride 5 mL/hr (02/13/17 1203)     PRN: insulin aspart **AND** dextrose, diphenhydrAMINE, hydrALAZINE, HYDROcodone-acetaminophen, LORazepam    Labs:  CBC: WBC/Hgb/Hct/Plts:  13.40/12.6/38.4/242 (12/24 0600)  Chemistry:Na/K/Cl/CO2/BUN/Cr/glu:  137/4.5/93/30/22/0.92/203 (12/24 0600)    POC Glucose:   Recent Labs      02/18/17   2216  02/18/17   1725  02/18/17   1200  02/18/17   0757   GLUCOSEPOC  144*  348*  272*  194*     PH 7.41, pCO2 52, HCO3 31.7    Microbiology: Reviewed. Pertinent data:  Resp cx (fungal) 02/13/17: candida   12/13 BAL:  ??????AFB NTD  ??????Bact NTD  ??????Fungal: Candida  ??????GS: negative  ??????Biofire:  negative  Aspergillus Fumigatus 02/05/17: <0.10  Cryptococcal Ag blood 02/06/17: negative  Bacterial Culture Respiratory 02/01/17: Few Probable candida albicans , Aspergillus [place]???     Assessment and Plan:   IZEN PETZ is a 76 y.o. 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    #Acute on Chronic Mixed hypoxemic/hypercapneic respiratory failure - Acute hypoxemia with increasing O2 requirement and hypercapnia, worsened after bronchoscopy. Infectious workup thus far has not revealed culprit pathogen. 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 Has consolidation w mediastinal LAD, malignancy is on DDx (see above). Diastolic HF also contributing.  - Pulm consulted, appreciate rec's:  - s/p vanc (12/14), pip-tazo???(12/14-12/15), s/p voriconazole (12/14-16); held per ID recs given BAL cultures NTD, s/p IV Ceftriaxone and metronidazole (12/15-20)  - s/p IV Vancomycin (per pharmacy) and IV piperacillin/tazobactam 3.375 g q8h (12/18-12/23), completed 14 day course of abx  - s/p IV methylprednisolone 40 mg daily x 5 days for COPD exacerbation/airway inflammation post bronch (12/18-12/23)  - bipap at night, daily VBG   -???scheduled albuterol/ipratropium nebs???q4h while awake   - budesonide-formoterol 160-4.5 mcg 2 puffs BID   - encourage incentive spirometry   - plan for discharge on triple therapy (LABA/LAMA/ICS) --> submit PA for Trelegy Ellipta 1 puff daily   - pleural effusions on CT chest visualized, not amenable for thoracentesis  - diuresis below  ??????  #Chronic Diastolic Heart Failure, POA  Grade I diastolic dysfunction per 01/2017 TTE, EF 70%. Episode of increasing O2 requirement on 12/22, suspicious for continued fluid overload based on review of recent CT scan.  - furosemide 80 mg BID   - cont diuresis, goal -1.5L  - daily weights   - strict ins/outs   ???  #Hypervascular Liver Lesions/Mediastinal LAD- c/f malignancy, hx prostate cancer treated with total prostatectomy.  - plan for outpatient IR biopsy for liver lesions, as these are likely the most amenable for biopsy  - EBUS considered, but ultimately liver bx favored given poor post-op course from last bronchoscopy given underlying pulmonary disease     #Afib  Rate-controlled  - carvedilol 25 mg mg???BID    - enoxaparin 100 mg BID   ???  #HTN  SBP 130s-150s, on methylprednisolone and scheduled duonebs that may be contributing to HTN, d/c hydralazine 50 mg TID which showed no improvement in BP  - losartan 100 mg daily   - carvedilol25 mg BID    - cont nifedipine ER 30 mg daily (consider BID tomorrow if evening BP elevated)   - next agent would be thiazide diuretic  ???  #DM2 with associated diabetic peripheral neuropathy  Blood glucose at goal   - insulin sliding scale algorithm #3  - gabapentin 800 mg ???TID   ???  #PAD  s/p femoral-femoral graft 2009.  - holding clopidogrel 75 mg daily in anticipation of biopsy to work up LAD/Liver lesions  ???  #HLD chronic/stable - pravastatin 40 mg nightly   ???  #GERD chronic/stable  - pantoprazole 40 mg daily   ???  #depression/ anxiety  Mood stable, no SI, no anxiety  - buspirone 15 mg BID  - duloxetine DR 60   - lorazepam 2 mg PRN BID  ???  #chronic???pain   Localized to lower back and lower extremities  - norco 5-325 q6h PRN  - schedule  outpatient pain clinic follow up     #AKI (resolved)  - monitor GFR w diuresis  - avoid nephrotoxins    #genital HSV (resolved)   Throughout admission patient was noted to have active flare. 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   - s/p acyclovir 400mg  po TID (12/10-12/16)    Inpatient Checklist  Diet: Diet 2 gram sodium  Diet carbohydrate controlled  DVT Prophylaxis: therapeutic enoxaparin  GI Prophylaxis: not indicated  Central Lines: None Day:0  Tubes/Drains: None Day:0    CODE STATUS: Full Code  CONTACT: Extended Emergency Contact Information  Primary Emergency Contact: Hosmer,Johnnie  Address: [address]           [place], [place] [address] [place]  Home Phone: [phone]  Mobile Phone: [phone]  Relation: Spouse  Secondary Emergency Contact: Marcina Millard  Address: [address]                          [place] [address] [place]  Home Phone: [phone]  Work Phone: [phone]  Relation: Friend    Will discuss with attending, Dr. Johnnye Sima, MD  [phone]  Current Hospital Problems           POA    * (Principal)Acute and chronic respiratory failure with hypoxia (HCC/RAF) Yes    AF (paroxysmal atrial fibrillation) (HCC/RAF) Yes    Chronic diastolic heart failure of unknown etiology (HCC/RAF) Yes    Chronic prescription opiate use Not Applicable    Controlled type 2 diabetes mellitus with diabetic neuropathy (HCC/RAF) Yes    COPD (chronic obstructive pulmonary disease) (HCC/RAF) Yes    COPD exacerbation (HCC/RAF) Yes    GERD (gastroesophageal reflux disease) Yes    Hypercholesteremia Yes Hypertension, essential with worsening due to steroid use Yes    Liver masses, hypervascular with history of prostate cancer Yes    Lower back pain Yes    Lower extremity edema Yes    Memory loss Unknown    OA (osteoarthritis) of knee Yes    PAD (peripheral artery disease) (HCC/RAF) Yes    Primary osteoarthritis of knees, bilateral Yes    RESOLVED: AKI (acute kidney injury) (HCC/RAF) Unknown    RESOLVED: Genital herpes Clinically Undetermined    RESOLVED: Hospital-acquired bacterial pneumonia, presumed Clinically Undetermined        I have interviewed and examined the patient with Dr. Rolla Etienne on the date of service. All labs, studies, and medications were independently reviewed by me. I have reviewed the note above and agree with the history, physical, assessment and plan which we formulated together. The patient was informed of the above plan, demonstrated understanding and was agreeable to the plan as written above.  Now on 4L at rest, likely will need portable home O2 as well require when ambulating, encouraged IS and ambulation.  Currently on lovenox, am concerned re: compliance as twice daily injection, vs apixaban which is oral and easier to take.  Unlikely to get an expedited liver biopsy in the setting of holiday schedule.  Would recommend continuing apixaban as outpatient, schedule liver biopsy and have the patient see preop 1 week prior to the procedure to determine plan for bridging (not likely a bridging candidate based on ACC 2015 risk stratification which still uses CHADS2) and may simply need to hold apixiban (usually 2-3 days  prior to procedure).  Will discuss with pulmonary to further elucidate why they recommend lovenox. Would benefit from a preop visit also to help with medication compliance and education.  Will follow up with Dr. Lorin Picket as an outpatient.   Arsenio Loader

## 2017-02-19 NOTE — Consults
9:19 AM  Ff up with medicine team - not for DC today, however pt refused SNF. HH orders placed for RN, team to add PT.  Referral sent via [facility].    Discharge needs:  [place], PT  02/19/17 - Referral sent via [facility].    Margaretmary Eddy, RN, BSN  Clinical Case Manager - Holiday Coverage

## 2017-02-20 LAB — CBC: RED CELL DISTRIBUTION WIDTH-CV: 14.4 (ref 11.1–15.5)

## 2017-02-20 LAB — Glucose,POC
GLUCOSE,POC: 212 mg/dL — ABNORMAL HIGH (ref 65–99)
GLUCOSE,POC: 297 mg/dL — ABNORMAL HIGH (ref 65–99)
GLUCOSE,POC: 136 mg/dL — ABNORMAL HIGH (ref 65–99)
GLUCOSE,POC: 227 mg/dL — ABNORMAL HIGH (ref 65–99)

## 2017-02-20 LAB — Basic Metabolic Panel
CALCIUM: 9 mg/dL (ref 8.6–10.4)
CALCIUM: 9 mg/dL (ref 8.6–10.4)

## 2017-02-20 LAB — Magnesium: MAGNESIUM: 1.6 meq/L (ref 1.4–1.9)

## 2017-02-20 LAB — Blood Gases,venous: O2 SATURATION/MEASURED: 86.2

## 2017-02-20 LAB — Differential Automated: LYMPHOCYTE PERCENT, AUTO: 12.2 (ref 0.20–0.80)

## 2017-02-20 MED ADMIN — INSULIN ASPART 100 UNIT/ML SC SOPN: 3 mL | SUBCUTANEOUS | @ 20:00:00 | Stop: 2017-02-22 | NDC 00169633910

## 2017-02-20 MED ADMIN — HYDROCODONE-ACETAMINOPHEN 5-325 MG PO TABS: 2 | ORAL | @ 20:00:00 | Stop: 2017-02-22 | NDC 00406012362

## 2017-02-20 MED ADMIN — BUDESONIDE-FORMOTEROL FUMARATE 160-4.5 MCG/ACT IN AERO: 2 | RESPIRATORY_TRACT | @ 16:00:00 | Stop: 2017-02-22 | NDC 00186037028

## 2017-02-20 MED ADMIN — ALBUTEROL SULFATE (5 MG/ML) 0.5% IN NEBU: 2.5 mg | RESPIRATORY_TRACT | @ 20:00:00 | Stop: 2017-02-22 | NDC 00487990130

## 2017-02-20 MED ADMIN — GABAPENTIN 400 MG PO CAPS: 800 mg | ORAL | @ 05:00:00 | Stop: 2017-02-22 | NDC 68084077401

## 2017-02-20 MED ADMIN — DOCUSATE SODIUM 100 MG PO CAPS: 100 mg | ORAL | @ 05:00:00 | Stop: 2017-02-22 | NDC 00904645561

## 2017-02-20 MED ADMIN — PANTOPRAZOLE SODIUM 40 MG PO TBEC: 40 mg | ORAL | @ 17:00:00 | Stop: 2017-02-22 | NDC 68084081309

## 2017-02-20 MED ADMIN — FUROSEMIDE 80 MG PO TABS: 80 mg | ORAL | @ 17:00:00 | Stop: 2017-02-21 | NDC 51079052720

## 2017-02-20 MED ADMIN — IPRATROPIUM BROMIDE 0.02 % IN SOLN: 500 ug | RESPIRATORY_TRACT | @ 16:00:00 | Stop: 2017-02-22 | NDC 00487980101

## 2017-02-20 MED ADMIN — ALBUTEROL SULFATE (5 MG/ML) 0.5% IN NEBU: 2.5 mg | RESPIRATORY_TRACT | @ 04:00:00 | Stop: 2017-02-22 | NDC 00487990130

## 2017-02-20 MED ADMIN — NIFEDIPINE ER OSMOTIC RELEASE 30 MG PO TB24: 30 mg | ORAL | @ 17:00:00 | Stop: 2017-02-22 | NDC 50268059715

## 2017-02-20 MED ADMIN — APIXABAN 5 MG PO TABS: 5 mg | ORAL | @ 17:00:00 | Stop: 2017-02-22 | NDC 00003089431

## 2017-02-20 MED ADMIN — CARVEDILOL 25 MG PO TABS: 25 mg | ORAL | @ 17:00:00 | Stop: 2017-02-22 | NDC 00904630361

## 2017-02-20 MED ADMIN — SENNOSIDES 8.6 MG PO TABS: 1 | ORAL | @ 05:00:00 | Stop: 2017-02-21 | NDC 00904652261

## 2017-02-20 MED ADMIN — IPRATROPIUM BROMIDE 0.02 % IN SOLN: 500 ug | RESPIRATORY_TRACT | @ 01:00:00 | Stop: 2017-02-22

## 2017-02-20 MED ADMIN — IPRATROPIUM BROMIDE 0.02 % IN SOLN: 500 ug | RESPIRATORY_TRACT | @ 04:00:00 | Stop: 2017-02-22 | NDC 00487980101

## 2017-02-20 MED ADMIN — HYDROCODONE-ACETAMINOPHEN 5-325 MG PO TABS: 2 | ORAL | @ 02:00:00 | Stop: 2017-02-22 | NDC 00406012362

## 2017-02-20 MED ADMIN — IPRATROPIUM BROMIDE 0.02 % IN SOLN: 500 ug | RESPIRATORY_TRACT | @ 20:00:00 | Stop: 2017-02-22 | NDC 00487980101

## 2017-02-20 MED ADMIN — IPRATROPIUM BROMIDE 0.02 % IN SOLN: 500 ug | RESPIRATORY_TRACT | @ 14:00:00 | Stop: 2017-02-22

## 2017-02-20 MED ADMIN — CARVEDILOL 25 MG PO TABS: 25 mg | ORAL | @ 05:00:00 | Stop: 2017-02-22 | NDC 00904630361

## 2017-02-20 MED ADMIN — ALBUTEROL SULFATE (5 MG/ML) 0.5% IN NEBU: 2.5 mg | RESPIRATORY_TRACT | @ 16:00:00 | Stop: 2017-02-22 | NDC 00487990130

## 2017-02-20 MED ADMIN — ALBUTEROL SULFATE (5 MG/ML) 0.5% IN NEBU: 2.5 mg | RESPIRATORY_TRACT | @ 14:00:00 | Stop: 2017-02-22

## 2017-02-20 MED ADMIN — APIXABAN 5 MG PO TABS: 5 mg | ORAL | @ 05:00:00 | Stop: 2017-02-22 | NDC 00003089431

## 2017-02-20 MED ADMIN — PRAVASTATIN SODIUM 40 MG PO TABS: 40 mg | ORAL | @ 05:00:00 | Stop: 2017-02-22 | NDC 51079078220

## 2017-02-20 MED ADMIN — BUSPIRONE HCL 5 MG PO TABS: 15 mg | ORAL | @ 17:00:00 | Stop: 2017-02-21 | NDC 16729020001

## 2017-02-20 MED ADMIN — LOSARTAN POTASSIUM 50 MG PO TABS: 100 mg | ORAL | @ 17:00:00 | Stop: 2017-02-22 | NDC 68084034701

## 2017-02-20 MED ADMIN — GABAPENTIN 400 MG PO CAPS: 800 mg | ORAL | @ 14:00:00 | Stop: 2017-02-22 | NDC 68084077401

## 2017-02-20 MED ADMIN — DOCUSATE SODIUM 100 MG PO CAPS: 100 mg | ORAL | @ 17:00:00 | Stop: 2017-02-22 | NDC 00904645561

## 2017-02-20 MED ADMIN — INSULIN ASPART 100 UNIT/ML SC SOPN: 3 mL | SUBCUTANEOUS | @ 02:00:00 | Stop: 2017-02-22 | NDC 00169633910

## 2017-02-20 MED ADMIN — BUSPIRONE HCL 5 MG PO TABS: 15 mg | ORAL | @ 05:00:00 | Stop: 2017-02-21 | NDC 16729020001

## 2017-02-20 MED ADMIN — BUDESONIDE-FORMOTEROL FUMARATE 160-4.5 MCG/ACT IN AERO: 2 | RESPIRATORY_TRACT | @ 05:00:00 | Stop: 2017-02-22 | NDC 00186037028

## 2017-02-20 MED ADMIN — INSULIN ASPART 100 UNIT/ML SC SOPN: 3 mL | SUBCUTANEOUS | @ 06:00:00 | Stop: 2017-02-22 | NDC 00169633910

## 2017-02-20 MED ADMIN — DULOXETINE HCL 60 MG PO CPEP: 60 mg | ORAL | @ 17:00:00 | Stop: 2017-02-22 | NDC 00904645461

## 2017-02-20 MED ADMIN — HYDROCODONE-ACETAMINOPHEN 5-325 MG PO TABS: 2 | ORAL | @ 14:00:00 | Stop: 2017-02-22 | NDC 00406012362

## 2017-02-20 MED ADMIN — POLYETHYLENE GLYCOL 3350 17 G PO PACK: 17 g | ORAL | @ 17:00:00 | Stop: 2017-02-22

## 2017-02-20 MED ADMIN — GABAPENTIN 400 MG PO CAPS: 800 mg | ORAL | @ 20:00:00 | Stop: 2017-02-22 | NDC 68084077401

## 2017-02-20 MED ADMIN — PYRIDOXINE HCL 50 MG PO TABS: 100 mg | ORAL | @ 17:00:00 | Stop: 2017-02-22 | NDC 00536440801

## 2017-02-20 MED ADMIN — ALBUTEROL SULFATE (5 MG/ML) 0.5% IN NEBU: 2.5 mg | RESPIRATORY_TRACT | @ 01:00:00 | Stop: 2017-02-22

## 2017-02-20 NOTE — Other
Patients Clinical Goal:   Clinical Goal(s) for the Shift: SpO2 >92%, pain <5/10, safety  Identify possible barriers to advancing the care plan:   Stability of the patient: Moderately Stable - low risk of patient condition declining or worsening   End of Shift Summary: AOX4, mildly anxious at times. SpO2 >86% on 4 L NC O2. Patient desats to 85% but gradually goes up to 87-88%, MD aware. Pt says his baseline at home is from 85% to 88% on a 3L NC O2, Dr. Kallie Edward aware. RT treatment done every 4 hours. Complained of generalized pain, PRN Norco given w/ adequate relief. Sat on the chair most of the day. Repleted magnesium. Good appetite noted. Rested w/o distress. Will continue to monitor.

## 2017-02-20 NOTE — Other
Patients Clinical Goal:   Clinical Goal(s) for the Shift: SaO2 >92%, pain mgt, BS: 80-140  Identify possible barriers to advancing the care plan: None  Stability of the patient: Moderately Stable - low risk of patient condition declining or worsening   End of Shift Summary: Alert and oriented x4. SaO2 88%-94% on 4L NC. RT treatment done in the beginning of shift. Pt refused RT treatment this am. C/o generalized pain, given norco PO PRN x1. BS: 297, coverage insulin given. Slept in intervals throughout the night. Will continue to monitor.

## 2017-02-20 NOTE — Consults
CM Note - anticipated DC date 12/26    Janet Berlin, Christoper Allegra Rep for oxygen and neb needs at home prior to discharge from hospital.  Await response.      Su Grand RN  Clinical Case Manager  [phone]  ____________________    12/25  1315  inhaler med requires prior auth -deferred. Emailed & s/w Ivor Reining re: MD note verbage per Acie Fredrickson

## 2017-02-20 NOTE — Progress Notes
General Medicine Progress Note    Patient Name: Ryan Shepard  MRN: [id number]  DOB: 02-15-1941  PMD: Wilburn Mylar, MD  Date of Admission: 02/01/2017    Date of Service: 02/20/2017  Hospital Day: 53     Chief Complaint: SOB      Subjective:   24-hour Course/Interval Events:   No acute events overnight, afebrile  On 4L NC  repleted Mg this AM    Subjective:  Patient continues to have SOB and cough, denies N/V/C/D or dysuria or abdominal pain    Objective:   Vital Signs:  Temp:  [36.9 ???C (98.4 ???F)-37 ???C (98.6 ???F)] 37 ???C (98.6 ???F)  Heart Rate:  [79-89] 89  Resp:  [14-23] 20  BP: (117-161)/(59-72) 139/65  NBP Mean:  [74-91] 86  SpO2:  [86 %-92 %] 92 %      Intake/Output Summary (Last 24 hours) at 02/20/17 0725  Last data filed at 02/20/17 0540   Gross per 24 hour   Intake             1065 ml   Output             2875 ml   Net            -1810 ml     Physical Exam:  Gen: no acute distress, appears comfortable, sitting up in bed, NC in place, + diaphoretic   HEENT: mmm, anicteric sclerae  CV: RRR, no murmurs   Pulm: improved air movement, crackles bilaterally, normal work of breathing  Abd: NABS, soft, flat, NT, no masses or organomegaly  Skin: Warm and dry, no rashes  Ext: improvement in lower extremity edema bilaterally, now 1+  Neuro: A&Ox4, CNII-XII intact, motor 5/5 throughout, sensation grossly intact  Psych: depressed mood, appropriate affect    Medications:  Scheduled:   ??? albuterol  2.5 mg Nebulization Q4H while awake   ??? apixaban  5 mg Oral BID   ??? budesonide-formoterol  2 puff Inhalation BID   ??? busPIRone  15 mg Oral BID   ??? carvedilol  25 mg Oral BID   ??? docusate  100 mg Oral BID   ??? DULoxetine  60 mg Oral Daily   ??? furosemide  80 mg Oral BID (diuretic)   ??? gabapentin  800 mg Oral TID   ??? ipratropium  0.5 mg Nebulization Q4H while awake   ??? losartan  100 mg Oral Daily   ??? NIFEdipine  30 mg Oral Daily   ??? pantoprazole  40 mg Oral Daily   ??? polyethylene glycol  17 g Oral Daily   ??? pravastatin  40 mg Oral QHS ??? pyridoxine  100 mg Oral Daily   ??? senna  1 tablet Oral QHS     Continuous Transfusions:   ??? sodium chloride 5 mL/hr (02/13/17 1203)     PRN: albuterol, insulin aspart **AND** dextrose, diphenhydrAMINE, hydrALAZINE, HYDROcodone-acetaminophen, LORazepam    Labs:  CBC: WBC/Hgb/Hct/Plts:  15.07/13.7/41.2/213 (12/25 0510)  Chemistry:Na/K/Cl/CO2/BUN/Cr/glu:  133/4.4/91/29/20/0.91/155 (12/25 0510)    POC Glucose:   Recent Labs      02/19/17   2139  02/19/17   1758  02/19/17   1206  02/19/17   0830   GLUCOSEPOC  297*  212*  214*  148*     PH 7.41, pCO2 52, HCO3 31.7    Microbiology: Reviewed. Pertinent data:  Resp cx (fungal) 02/13/17: candida   12/13 BAL:  ??????AFB NTD  ??????Bact NTD  ??????Fungal: Candida  ??????GS: negative  ??????  Biofire: negative  Aspergillus Fumigatus 02/05/17: <0.10  Cryptococcal Ag blood 02/06/17: negative  Bacterial Culture Respiratory 02/01/17: Few Probable candida albicans , Aspergillus [place]???     Assessment and Plan:   AMEAR STROJNY is a 76 y.o. 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    #leukocytosis   WBC uptrending since 12/21 (10.78), currently at 15.07 today. S/p 5 day course of methylprednisolone. 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 Infectious workup thus far has not revealed culprit pathogen. 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 Has consolidation w mediastinal LAD, malignancy is on DDx (see above). Diastolic HF also contributing.  - Pulm consulted, appreciate rec's: - s/p vanc (12/14), pip-tazo???(12/14-12/15), s/p voriconazole (12/14-16); held per ID recs given BAL cultures NTD, s/p IV Ceftriaxone and metronidazole (12/15-20)  - s/p IV Vancomycin (per pharmacy) and IV piperacillin/tazobactam 3.375 g q8h (12/18-12/23), completed 14 day course of abx  - s/p IV methylprednisolone 40 mg daily x 5 days for COPD exacerbation/airway inflammation post bronch (12/18-12/23)  - bipap at night, daily VBG   - 4L NC, goal SpO2 >88%  -???scheduled albuterol/ipratropium nebs???q4h while awake   - budesonide-formoterol 160-4.5 mcg 2 puffs BID   - encourage incentive spirometry   - plan for discharge on triple therapy (LABA/LAMA/ICS) --> submit PA for Trelegy Ellipta 1 puff daily   - pleural effusions on CT chest visualized, not amenable for thoracentesis  - diuresis below  - I have seen this patient face to face on 02/20/17  Patient is short of breath due to COPD.  Current desat of 80% on room air at rest  They have been tried on albuterol treatments, but the treatments are not enough. Furosemide has been tried for fluid overload, with little success.  Patient will require home oxygen for continuous use, which will decrease the chances of being readmitted to the hospital.  Patient and family are aware of the order.  ??????  #Chronic Diastolic Heart Failure, POA  Grade I diastolic dysfunction per 01/2017 TTE, EF 70%. Episode of increasing O2 requirement on 12/22, suspicious for continued fluid overload based on review of recent CT scan.  - continue furosemide 80 mg BID   - cont diuresis, goal -1.5L  - daily weights   - strict ins/outs   ???  #Hypervascular Liver Lesions/Mediastinal LAD- c/f malignancy, hx prostate cancer treated with total prostatectomy.  - plan for outpatient IR biopsy for liver lesions, as these are likely the most amenable for biopsy  - EBUS considered, but ultimately liver bx favored given poor post-op course from last bronchoscopy given underlying pulmonary disease     #Afib Rate-controlled  - carvedilol 25 mg mg???BID    - apixaban 5 mg BID   ???  #HTN  SBP wnl, completed methylprednisolone. Continues to be on scheduled duonebs that may be contributing to HTN, d/c hydralazine 50 mg TID which showed no improvement in BP  - losartan 100 mg daily   - carvedilol25 mg BID    - nifedipine ER 30 mg daily  ???  #DM2 with associated diabetic peripheral neuropathy  Blood glucose at  goal   - insulin sliding scale algorithm #3  - gabapentin 800 mg ???TID   ???  #PAD  s/p femoral-femoral graft 2009.  - holding clopidogrel 75 mg daily in anticipation of biopsy to work up LAD/Liver lesions  ???  #HLD chronic/stable  - pravastatin 40 mg nightly   ???  #GERD chronic/stable  - pantoprazole 40 mg daily   ???  #depression/ anxiety  Mood stable, no SI, no anxiety  - buspirone 15 mg BID  - duloxetine DR 60   - lorazepam 2 mg PRN BID  ???  #chronic???pain   Localized to lower back and lower extremities  - norco 5-325 q6h PRN  - schedule outpatient pain clinic follow up     #AKI (resolved)  - monitor GFR w diuresis  - avoid nephrotoxins    #genital HSV (resolved)   Throughout admission patient was noted to have active flare. 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   - s/p acyclovir 400mg  po TID (12/10-12/16)    Inpatient Checklist  Diet: Diet 2 gram sodium  Diet carbohydrate controlled  DVT Prophylaxis: therapeutic enoxaparin  GI Prophylaxis: not indicated  Central Lines: None Day:0  Tubes/Drains: None Day:0    CODE STATUS: Full Code  CONTACT: Extended Emergency Contact Information  Primary Emergency Contact: Hafford,Johnnie  Address: [address]           [place], [place] [address] [place]  Home Phone: [phone]  Mobile Phone: [phone]  Relation: Spouse  Secondary Emergency Contact: Marcina Millard  Address: [address]                          [place] [address] [place]  Home Phone: [phone]  Work Phone: [phone]  Relation: Friend Will discuss with attending, Dr. Johnnye Sima, MD  [phone]

## 2017-02-21 ENCOUNTER — Telehealth: Payer: PRIVATE HEALTH INSURANCE

## 2017-02-21 ENCOUNTER — Telehealth: Payer: MEDICARE

## 2017-02-21 LAB — Blood Gases,venous: PO2,VENOUS: 28 mmHg

## 2017-02-21 LAB — Differential Automated: NEUTROPHIL PERCENT, AUTO: 63.6 (ref 0.00–0.10)

## 2017-02-21 LAB — Glucose,POC
GLUCOSE,POC: 262 mg/dL — ABNORMAL HIGH (ref 65–99)
GLUCOSE,POC: 137 mg/dL — ABNORMAL HIGH (ref 65–99)
GLUCOSE,POC: 144 mg/dL — ABNORMAL HIGH (ref 65–99)
GLUCOSE,POC: 213 mg/dL — ABNORMAL HIGH (ref 65–99)

## 2017-02-21 LAB — Basic Metabolic Panel
CHLORIDE: 91 mmol/L — ABNORMAL LOW (ref 96–106)
SODIUM: 134 mmol/L — ABNORMAL LOW (ref 135–146)

## 2017-02-21 LAB — CBC: NUCLEATED RBC%, AUTOMATED: 0.1 (ref 31.5–35.5)

## 2017-02-21 LAB — Magnesium: MAGNESIUM: 1.5 meq/L (ref 1.4–1.9)

## 2017-02-21 MED ORDER — DULOXETINE HCL 60 MG PO CPEP
60 mg | ORAL_CAPSULE | Freq: Every day | ORAL | 0 refills | 30 days | Status: AC
Start: 2017-02-21 — End: ?
  Filled 2017-02-22: qty 30, 30d supply, fill #0

## 2017-02-21 MED ORDER — PANTOPRAZOLE SODIUM 40 MG PO TBEC
40 mg | ORAL_TABLET | Freq: Every evening | ORAL | 0 refills | 30.00 days | Status: AC
Start: 2017-02-21 — End: ?
  Filled 2017-02-22: qty 30, 30d supply, fill #0

## 2017-02-21 MED ORDER — FUROSEMIDE 40 MG PO TABS
40 mg | ORAL_TABLET | Freq: Two times a day (BID) | ORAL | 0 refills | 30.00000 days | Status: AC
Start: 2017-02-21 — End: ?
  Filled 2017-02-22: qty 60, 30d supply, fill #0

## 2017-02-21 MED ORDER — LINAGLIPTIN-METFORMIN HCL 2.5-1000 MG PO TABS
1 | ORAL_TABLET | Freq: Two times a day (BID) | ORAL | 0 refills | 30.00 days | Status: AC
Start: 2017-02-21 — End: 2017-02-22

## 2017-02-21 MED ORDER — NIFEDIPINE ER 30 MG PO TB24
30 mg | ORAL_TABLET | Freq: Every day | ORAL | 0 refills | 30.00000 days | Status: AC
Start: 2017-02-21 — End: ?
  Filled 2017-02-22: qty 30, 30d supply, fill #0

## 2017-02-21 MED ORDER — GABAPENTIN 400 MG PO CAPS
800 mg | ORAL_CAPSULE | Freq: Three times a day (TID) | ORAL | 0 refills | 30.00 days | Status: AC
Start: 2017-02-21 — End: ?
  Filled 2017-02-22: qty 180, 30d supply, fill #0

## 2017-02-21 MED ORDER — APIXABAN 5 MG PO TABS
5 mg | ORAL_TABLET | Freq: Two times a day (BID) | ORAL | 0 refills | 30.00 days | Status: AC
Start: 2017-02-21 — End: ?
  Filled 2017-02-22: qty 60, 30d supply, fill #0

## 2017-02-21 MED ORDER — ALLOPURINOL 100 MG PO TABS
100 mg | ORAL_TABLET | Freq: Every day | ORAL | 0 refills | 30.00 days | Status: AC
Start: 2017-02-21 — End: ?
  Filled 2017-02-22: qty 30, 30d supply, fill #0

## 2017-02-21 MED ORDER — AEROCHAMBER PLUS FLO-VU MISC
1 | Freq: Four times a day (QID) | 0 refills | 30.00000 days | Status: AC | PRN
Start: 2017-02-21 — End: ?
  Filled 2017-02-22: qty 1, 30d supply, fill #0

## 2017-02-21 MED ORDER — PRAVASTATIN SODIUM 40 MG PO TABS
40 mg | ORAL_TABLET | Freq: Every evening | ORAL | 0 refills | 30.00 days | Status: AC
Start: 2017-02-21 — End: ?
  Filled 2017-02-22: qty 30, 30d supply, fill #0

## 2017-02-21 MED ORDER — LOSARTAN POTASSIUM 100 MG PO TABS
100 mg | ORAL_TABLET | Freq: Every day | ORAL | 0 refills | 30.00000 days | Status: AC
Start: 2017-02-21 — End: ?
  Filled 2017-02-22: qty 30, 30d supply, fill #0

## 2017-02-21 MED ORDER — BUSPIRONE HCL 15 MG PO TABS
15 mg | ORAL_TABLET | Freq: Two times a day (BID) | ORAL | 0 refills | 30.00 days | Status: AC
Start: 2017-02-21 — End: ?

## 2017-02-21 MED ORDER — CARVEDILOL 25 MG PO TABS
25 mg | ORAL_TABLET | Freq: Two times a day (BID) | ORAL | 0 refills | 30.00 days | Status: AC
Start: 2017-02-21 — End: ?
  Filled 2017-02-22: qty 60, 30d supply, fill #0

## 2017-02-21 MED ORDER — ALBUTEROL SULFATE HFA 108 (90 BASE) MCG/ACT IN AERS
2 | Freq: Four times a day (QID) | RESPIRATORY_TRACT | 0 refills | 30.00000 days | Status: AC | PRN
Start: 2017-02-21 — End: ?
  Filled 2017-02-22: qty 75, 5d supply, fill #0

## 2017-02-21 MED ORDER — ALBUTEROL SULFATE (2.5 MG/3ML) 0.083% IN NEBU
2.5 mg | RESPIRATORY_TRACT | 3 refills | 5.00000 days | Status: AC | PRN
Start: 2017-02-21 — End: ?

## 2017-02-21 MED ORDER — LINAGLIPTIN-METFORMIN HCL 2.5-1000 MG PO TABS
1 | ORAL_TABLET | Freq: Two times a day (BID) | ORAL | 0 refills | Status: AC
Start: 2017-02-21 — End: ?

## 2017-02-21 MED ADMIN — DOCUSATE SODIUM 100 MG PO CAPS: 100 mg | ORAL | @ 18:00:00 | Stop: 2017-02-22 | NDC 00904645561

## 2017-02-21 MED ADMIN — DULOXETINE HCL 60 MG PO CPEP: 60 mg | ORAL | @ 18:00:00 | Stop: 2017-02-22 | NDC 00904645461

## 2017-02-21 MED ADMIN — ALBUTEROL SULFATE (5 MG/ML) 0.5% IN NEBU: 2.5 mg | RESPIRATORY_TRACT | @ 05:00:00 | Stop: 2017-02-22 | NDC 00487990130

## 2017-02-21 MED ADMIN — LOSARTAN POTASSIUM 50 MG PO TABS: 100 mg | ORAL | @ 18:00:00 | Stop: 2017-02-22 | NDC 68084034701

## 2017-02-21 MED ADMIN — SENNOSIDES 8.6 MG PO TABS: 1 | ORAL | @ 05:00:00 | Stop: 2017-02-21 | NDC 00904652261

## 2017-02-21 MED ADMIN — HYDROCODONE-ACETAMINOPHEN 5-325 MG PO TABS: 2 | ORAL | @ 02:00:00 | Stop: 2017-02-22 | NDC 00406012362

## 2017-02-21 MED ADMIN — IPRATROPIUM BROMIDE 0.02 % IN SOLN: 500 ug | RESPIRATORY_TRACT | @ 20:00:00 | Stop: 2017-02-22 | NDC 00487980101

## 2017-02-21 MED ADMIN — BUSPIRONE HCL 5 MG PO TABS: 15 mg | ORAL | @ 18:00:00 | Stop: 2017-02-22 | NDC 16729020001

## 2017-02-21 MED ADMIN — MAGNESIUM SULFATE 2 GM/50ML IV SOLN: 2 g | INTRAVENOUS | @ 17:00:00 | Stop: 2017-02-21 | NDC 44567042024

## 2017-02-21 MED ADMIN — CARVEDILOL 25 MG PO TABS: 25 mg | ORAL | @ 05:00:00 | Stop: 2017-02-22 | NDC 00904630361

## 2017-02-21 MED ADMIN — HYDROCODONE-ACETAMINOPHEN 5-325 MG PO TABS: 2 | ORAL | @ 08:00:00 | Stop: 2017-02-22 | NDC 00406012362

## 2017-02-21 MED ADMIN — HYDROCODONE-ACETAMINOPHEN 5-325 MG PO TABS: 2 | ORAL | @ 14:00:00 | Stop: 2017-02-22 | NDC 00406012362

## 2017-02-21 MED ADMIN — POLYETHYLENE GLYCOL 3350 17 G PO PACK: 17 g | ORAL | @ 18:00:00 | Stop: 2017-02-22

## 2017-02-21 MED ADMIN — BUSPIRONE HCL 5 MG PO TABS: 15 mg | ORAL | @ 05:00:00 | Stop: 2017-02-21 | NDC 16729020001

## 2017-02-21 MED ADMIN — PRAVASTATIN SODIUM 40 MG PO TABS: 40 mg | ORAL | @ 05:00:00 | Stop: 2017-02-22 | NDC 51079078220

## 2017-02-21 MED ADMIN — ALBUTEROL SULFATE (5 MG/ML) 0.5% IN NEBU: 2.5 mg | RESPIRATORY_TRACT | @ 20:00:00 | Stop: 2017-02-22 | NDC 00487990130

## 2017-02-21 MED ADMIN — INSULIN ASPART 100 UNIT/ML SC SOPN: 3 mL | SUBCUTANEOUS | @ 21:00:00 | Stop: 2017-02-22 | NDC 00169633910

## 2017-02-21 MED ADMIN — CARVEDILOL 25 MG PO TABS: 25 mg | ORAL | @ 18:00:00 | Stop: 2017-02-22 | NDC 00904630361

## 2017-02-21 MED ADMIN — NIFEDIPINE ER OSMOTIC RELEASE 30 MG PO TB24: 30 mg | ORAL | @ 18:00:00 | Stop: 2017-02-22 | NDC 50268059715

## 2017-02-21 MED ADMIN — APIXABAN 5 MG PO TABS: 5 mg | ORAL | @ 18:00:00 | Stop: 2017-02-22 | NDC 00003089431

## 2017-02-21 MED ADMIN — GABAPENTIN 400 MG PO CAPS: 800 mg | ORAL | @ 05:00:00 | Stop: 2017-02-22 | NDC 68084077401

## 2017-02-21 MED ADMIN — ALBUTEROL SULFATE (5 MG/ML) 0.5% IN NEBU: 2.5 mg | RESPIRATORY_TRACT | @ 18:00:00 | Stop: 2017-02-22

## 2017-02-21 MED ADMIN — APIXABAN 5 MG PO TABS: 5 mg | ORAL | @ 05:00:00 | Stop: 2017-02-22 | NDC 00003089431

## 2017-02-21 MED ADMIN — INSULIN ASPART 100 UNIT/ML SC SOPN: 3 mL | SUBCUTANEOUS | @ 01:00:00 | Stop: 2017-02-22 | NDC 00169633910

## 2017-02-21 MED ADMIN — INSULIN ASPART 100 UNIT/ML SC SOPN: 3 mL | SUBCUTANEOUS | @ 18:00:00 | Stop: 2017-02-22 | NDC 00169633910

## 2017-02-21 MED ADMIN — FUROSEMIDE 40 MG PO TABS: 40 mg | ORAL | @ 18:00:00 | Stop: 2017-02-21 | NDC 51079007320

## 2017-02-21 MED ADMIN — INSULIN ASPART 100 UNIT/ML SC SOPN: 3 mL | SUBCUTANEOUS | @ 06:00:00 | Stop: 2017-02-22 | NDC 00169633910

## 2017-02-21 MED ADMIN — IPRATROPIUM BROMIDE 0.02 % IN SOLN: 500 ug | RESPIRATORY_TRACT | @ 13:00:00 | Stop: 2017-02-22

## 2017-02-21 MED ADMIN — PANTOPRAZOLE SODIUM 40 MG PO TBEC: 40 mg | ORAL | @ 18:00:00 | Stop: 2017-02-22 | NDC 68084081309

## 2017-02-21 MED ADMIN — HYDROCODONE-ACETAMINOPHEN 5-325 MG PO TABS: 2 | ORAL | @ 20:00:00 | Stop: 2017-02-22 | NDC 00406012362

## 2017-02-21 MED ADMIN — IPRATROPIUM BROMIDE 0.02 % IN SOLN: 500 ug | RESPIRATORY_TRACT | @ 05:00:00 | Stop: 2017-02-22 | NDC 00487980101

## 2017-02-21 MED ADMIN — IPRATROPIUM BROMIDE 0.02 % IN SOLN: 500 ug | RESPIRATORY_TRACT | @ 18:00:00 | Stop: 2017-02-22

## 2017-02-21 MED ADMIN — ALBUTEROL SULFATE (5 MG/ML) 0.5% IN NEBU: 2.5 mg | RESPIRATORY_TRACT | @ 13:00:00 | Stop: 2017-02-22

## 2017-02-21 MED ADMIN — DOCUSATE SODIUM 100 MG PO CAPS: 100 mg | ORAL | @ 05:00:00 | Stop: 2017-02-22 | NDC 00904645561

## 2017-02-21 MED ADMIN — GABAPENTIN 400 MG PO CAPS: 800 mg | ORAL | @ 14:00:00 | Stop: 2017-02-22 | NDC 68084077401

## 2017-02-21 MED ADMIN — IPRATROPIUM BROMIDE 0.02 % IN SOLN: 500 ug | RESPIRATORY_TRACT | @ 01:00:00 | Stop: 2017-02-22 | NDC 00487980101

## 2017-02-21 MED ADMIN — GABAPENTIN 400 MG PO CAPS: 800 mg | ORAL | @ 20:00:00 | Stop: 2017-02-22 | NDC 68084077401

## 2017-02-21 MED ADMIN — BUDESONIDE-FORMOTEROL FUMARATE 160-4.5 MCG/ACT IN AERO: 2 | RESPIRATORY_TRACT | @ 05:00:00 | Stop: 2017-02-22 | NDC 00186037028

## 2017-02-21 MED ADMIN — BUDESONIDE-FORMOTEROL FUMARATE 160-4.5 MCG/ACT IN AERO: 2 | RESPIRATORY_TRACT | @ 20:00:00 | Stop: 2017-02-22 | NDC 00186037028

## 2017-02-21 MED ADMIN — ALBUTEROL SULFATE (5 MG/ML) 0.5% IN NEBU: 2.5 mg | RESPIRATORY_TRACT | @ 01:00:00 | Stop: 2017-02-22 | NDC 00487990130

## 2017-02-21 MED ADMIN — MAGNESIUM SULFATE 2 GM/50ML IV SOLN: 2 g | INTRAVENOUS | @ 19:00:00 | Stop: 2017-02-21

## 2017-02-21 MED ADMIN — PYRIDOXINE HCL 50 MG PO TABS: 100 mg | ORAL | @ 18:00:00 | Stop: 2017-02-22 | NDC 00536440801

## 2017-02-21 MED ADMIN — FUROSEMIDE 80 MG PO TABS: 80 mg | ORAL | @ 01:00:00 | Stop: 2017-02-21 | NDC 51079052720

## 2017-02-21 MED ADMIN — MAGNESIUM OXIDE 400 (241.3 MG) MG PO TABS: 400 mg | ORAL | @ 20:00:00 | Stop: 2017-02-21

## 2017-02-21 NOTE — Other
Patients Clinical Goal:   Clinical Goal(s) for the Shift: SpO2>92%, SBP 90-160, pain <5/10  Identify possible barriers to advancing the care plan:   Stability of the patient: Moderately Stable - low risk of patient condition declining or worsening   End of Shift Summary: Patient is alert & conversant. SpO2 >86% on 4L NC O2. Denies SOB or any distress. SBP 111-141. BS [id number], SSI given per protocol. Complained of generalized pain, PRN Norco given w/ good relief. Patient cont to have a productive cough, small thick tan sputum noted. Family at bedside. Rested w/o distress. Will continue to monitor.

## 2017-02-21 NOTE — Progress Notes
DME UPDATE:    Item(s) requested: oxygen     Status: pending medicare approval via [facility] DME coordinator     [phone]

## 2017-02-21 NOTE — Telephone Encounter
Confirmed 3: 12/28 @ 9:30 am (PCP), 01/03 @ 8:00 am (PULM), 01/10 @ 8:00 am (HEP/PRE-OP) with patient in room.

## 2017-02-21 NOTE — Consults
8:13 AM Called pt's wife Madyx [phone] unable to leave a VM to clarify home oxygen situation.     Called pt at bedside - states he will have his wife call this CM back re: home oxygen. Per pt, he only has oxygen concentrator and not portable oxygen.   Also updated     8:26 AM Jeannine Kitten to assist with portable oxygen and DME Nebulizer order.     9:05 AM Attempted to call pt's wife again- no answer and unable to leave VM.     Golden Hurter CM, RN BSN  Medicine Team D  P: [phone]

## 2017-02-21 NOTE — Progress Notes
General Medicine Progress Note    Patient Name: Ryan Shepard  MRN: [id number]  DOB: 1940-09-21  PMD: Wilburn Mylar, MD  Date of Admission: 02/01/2017    Date of Service: 02/21/2017  Hospital Day: 20     Chief Complaint: SOB      Subjective:   24-hour Course/Interval Events:   No acute events overnight, afebrile, vss  On 4L NC    Subjective:  Patient continues to have SOB and cough that is unchanged, denies N/V/C/D or dysuria or abdominal pain    Objective:   Vital Signs:  Temp:  [36.3 ???C (97.3 ???F)-37.4 ???C (99.3 ???F)] 36.4 ???C (97.5 ???F)  Heart Rate:  [82-99] 84  Resp:  [16-20] 16  BP: (111-141)/(58-69) 133/69  NBP Mean:  [73-90] 89  SpO2:  [86 %-91 %] 90 %      Intake/Output Summary (Last 24 hours) at 02/21/17 4403  Last data filed at 02/21/17 4742   Gross per 24 hour   Intake              605 ml   Output             2900 ml   Net            -2295 ml     Physical Exam:  Gen: no acute distress, appears comfortable, sitting up in bed, NC in place  HEENT: mmm, anicteric sclerae  CV: RRR, no murmurs   Pulm: course breath sounds bilaterally, crackles bilaterally, no wheezing, normal work of breathing  Abd: NABS, soft, flat, NT, no masses or organomegaly  Skin: Warm and dry, no rashes  Ext: no lower extremity edema   Neuro: A&Ox4, CNII-XII intact, motor 5/5 throughout, sensation grossly intact  Psych: normal mood, appropriate affect     Medications:  Scheduled:   ??? albuterol  2.5 mg Nebulization Q4H while awake   ??? apixaban  5 mg Oral BID   ??? budesonide-formoterol  2 puff Inhalation BID   ??? busPIRone  15 mg Oral BID   ??? carvedilol  25 mg Oral BID   ??? docusate  100 mg Oral BID   ??? DULoxetine  60 mg Oral Daily   ??? furosemide  40 mg Oral BID (diuretic)   ??? furosemide  40 mg Oral Once   ??? gabapentin  800 mg Oral TID   ??? ipratropium  0.5 mg Nebulization Q4H while awake   ??? losartan  100 mg Oral Daily   ??? magnesium sulfate  2 g Intravenous Once   ??? NIFEdipine  30 mg Oral Daily   ??? pantoprazole  40 mg Oral Daily ??? polyethylene glycol  17 g Oral Daily   ??? pravastatin  40 mg Oral QHS   ??? pyridoxine  100 mg Oral Daily   ??? senna  1 tablet Oral QHS     Continuous Transfusions:   ??? sodium chloride 5 mL/hr (02/13/17 1203)     PRN: albuterol, insulin aspart **AND** dextrose, diphenhydrAMINE, hydrALAZINE, HYDROcodone-acetaminophen, LORazepam    Labs:  CBC: WBC/Hgb/Hct/Plts:  12.04/14.4/45.5/183 (12/26 0516)  Chemistry:Na/K/Cl/CO2/BUN/Cr/glu:  134/4.5/91/28/19/0.99/165 (12/26 0516)    POC Glucose:   Recent Labs      02/21/17   0902  02/20/17   2208  02/20/17   1711  02/20/17   1204   GLUCOSEPOC  144*  137*  262*  227*     PH 7.33, pCO2 63, HCO3 32.4     Mg 1.5    Microbiology: Reviewed. Pertinent  data:  Resp cx (fungal) 02/13/17: candida   12/13 BAL:  ??????AFB NTD  ??????Bact NTD  ??????Fungal: Candida  ??????GS: negative  ??????Biofire: negative  Aspergillus Fumigatus 02/05/17: <0.10  Cryptococcal Ag blood 02/06/17: negative  Bacterial Culture Respiratory 02/01/17: Few Probable candida albicans , Aspergillus [place]???     Assessment and Plan:   Ryan Shepard is a 76 y.o. 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    #leukocytosis (resolving)  WBC 12.04, was initially uptrending since 12/21 (10.78), currently at 15.07 today. S/p 5 day course of methylprednisolone. Afebrile, denies diarrhea, dysuria, worsening cough or emesis. 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 Infectious workup thus far has not revealed culprit pathogen. 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 Has consolidation w mediastinal LAD, malignancy is on DDx (see above). Diastolic HF also contributing.  - Pulm consulted, appreciate rec's:  - s/p vanc (12/14), pip-tazo???(12/14-12/15), s/p voriconazole (12/14-16); held per ID recs given BAL cultures NTD, s/p IV Ceftriaxone and metronidazole (12/15-20)  - s/p IV Vancomycin (per pharmacy) and IV piperacillin/tazobactam 3.375 g q8h (12/18-12/23), completed 14 day course of abx  - s/p IV methylprednisolone 40 mg daily x 5 days for COPD exacerbation/airway inflammation post bronch (12/18-12/23)  - bipap at night   - 4L NC, goal SpO2 >88% will discharge with home oxygen and carrier  -???scheduled albuterol/ipratropium nebs???q4h while awake   - budesonide-formoterol 160-4.5 mcg 2 puffs BID   - encourage incentive spirometry   - plan for discharge on triple therapy (LABA/LAMA/ICS) --> pending PA for Trelegy Ellipta 1 puff daily   - pleural effusions on CT chest visualized, not amenable for thoracentesis  - diuresis below  - I have seen this patient face to face on 02/21/17.  Patient is diagnosed with COPD.  He requires nebulizer treatments due to the above Diagnosis.  Albuterol (2.5mg /0.5 ml) 0.5% solution is prescribed to help with his condition, Q4hrs.  This will minimize the chances of being admitted to the hospital.  Patient is short of breath due to COPD.  Current desat of 80% on room air at rest  They have been tried on albuterol treatments, but the treatments are not enough. Furosemide has been tried for fluid overload, with little success.  Patient will require home oxygen for continuous use, which will decrease the chances of being readmitted to the hospital.  Patient and family are aware of the order.  ??????  #Chronic Diastolic Heart Failure, POA  Grade I diastolic dysfunction per 01/2017 TTE, EF 70%. Episode of increasing O2 requirement on 12/22, suspicious for continued fluid overload based on review of recent CT scan.  - switch to furosemide 40 mg BID - daily weights   - strict ins/outs   ???  #Hypervascular Liver Lesions/Mediastinal LAD- c/f malignancy, hx prostate cancer treated with total prostatectomy.  - plan for outpatient IR biopsy for liver lesions, as these are likely the most amenable for biopsy  - EBUS considered, but ultimately liver bx favored given poor post-op course from last bronchoscopy given underlying pulmonary disease     #Afib  Rate-controlled  - carvedilol 25 mg mg???BID    - apixaban 5  mg BID   ???  #HTN  SBP wnl, completed methylprednisolone. Continues to be on scheduled duonebs that may be contributing to HTN, d/c hydralazine 50 mg TID which showed no improvement in BP  - losartan 100 mg daily   - carvedilol 25 mg BID    - nifedipine ER 30 mg daily  ???  #DM2 with associated diabetic peripheral neuropathy  Blood glucose at goal   - insulin sliding scale algorithm #3  - gabapentin 800 mg ???TID   ???  #PAD  s/p femoral-femoral graft 2009.  - holding clopidogrel 75 mg daily in anticipation of biopsy to work up LAD/Liver lesions  ???  #HLD chronic/stable  - pravastatin 40 mg nightly   ???  #GERD chronic/stable  - pantoprazole 40 mg daily   ???  #depression/ anxiety  Mood stable, no SI, no anxiety  - buspirone 15 mg BID  - duloxetine DR 60   - lorazepam 2 mg PRN BID  ???  #chronic???pain   Localized to lower back and lower extremities  - norco 5-325 q6h PRN  - schedule outpatient pain clinic follow up     #AKI (resolved)  - monitor GFR w diuresis  - avoid nephrotoxins    #genital HSV (resolved)   Throughout admission patient was noted to have active flare. 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   - s/p acyclovir 400mg  po TID (12/10-12/16)    Inpatient Checklist  Diet: Diet 2 gram sodium  Diet carbohydrate controlled Outside food is ok  DVT Prophylaxis: therapeutic enoxaparin  GI Prophylaxis: not indicated  Central Lines: None Day:0  Tubes/Drains: None Day:0  Dispo: discharge home today     CODE STATUS: Full Code CONTACT: Extended Emergency Contact Information  Primary Emergency Contact: Stefano,Johnnie  Address: [address]           [place], [place] [address] [place]  Home Phone: [phone]  Mobile Phone: [phone]  Relation: Spouse  Secondary Emergency Contact: Marcina Millard  Address: [address]                          [place] [address] [place]  Home Phone: [phone]  Work Phone: [phone]  Relation: Friend    Will discuss with attending, Dr. Johnnye Sima, MD  [phone]

## 2017-02-21 NOTE — Other
Patients Clinical Goal:   Clinical Goal(s) for the Shift: SaO2 >92%, pain mgt, BS: 80-140, free from falls   Identify possible barriers to advancing the care plan: None  Stability of the patient: Moderately Stable - low risk of patient condition declining or worsening   End of Shift Summary: Pt alert and oriented x4. SaO2 88%-93% on NC 4L. Continues to c/o generalized pain, given norco PO PRN with some relief. BS: 137, coverage insulin given as ordered. Tolerated all PO meds. Slept in intervals throughout the night. Refused to be weighed this am, will endorse to day RN. Will continue to monitor.

## 2017-02-21 NOTE — Telephone Encounter
Patient Ryan Shepard [id number] is scheduled for     Is the patient being discharged to SNF? No  Clinic/Test/Procedure to be scheduled:Pulmonary  Doctor Preferences (If applicable): Dr. Jeronimo Greaves  Time Frame: 1 week    Dr. Desma Maxim on 03/01/2017 @ 8:00 am   [address]    Clinic/Test/Procedure to be scheduled:Pre-operative Medicine  Doctor Preference (If applicable):n/a  Time Frame:1 week    Dr. Janan Halter on 03/08/2017 @ 8:00 am   [place]     After patient is seen by hep they will be scheduled for preop     Clinic/Test/Procedure to be scheduled:[facility]  Doctor Preference (If applicable):resident  Time Frame:1 week    Dr. Cleophus Molt on 02/10/2017 @ 9:30 am   [place] confirmed with patient room [phone]    Mailed out confirmation letters     Thanks

## 2017-02-21 NOTE — Discharge Summary
INTERNAL MEDICINE INPATIENT DISCHARGE SUMMARY:     Patient: Ryan Shepard  MRN: [id number]  DOB: 25-Mar-1940  Date of Service: 02/21/2017    Primary Care Provider: Wilburn Mylar, MD      ADMIT DATE: 02/01/2017    DISCHARGE DATE:  02/21/2017    HOSPITAL TEAM: Medicine Team D  Attending Physician: Arsenio Loader., MD  Resident: Dione Housekeeper, MD      ADMITTING DIAGNOSES:     Past Medical History:   Diagnosis Date   ??? Cancer (HCC/RAF)    ??? Diabetes (HCC/RAF)    ??? GERD (gastroesophageal reflux disease)    ??? Hypercholesteremia    ??? Hypertension    ??? PAD (peripheral artery disease) (HCC/RAF)    ??? 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 Prostate disease    ??? Scoliosis     adolesent   ??? Vascular disease        DISCHARGE DIAGNOSES:   Chronic Hypoxemic and Hypercapnic Respiratory Failure  Chronic Diastolic Heart Failure  Atrial Fibrillation  Liver lesions  Mediastinal Lymphadenopathy  Anxiety  Peripheral Artery Disease  Hypertension  Hyperlipidemia  Chronic Obstructive Pulmonary Disease  Type II Diabetes Mellitus    HISTORY OF PRESENT ILLNESS:   From H&P dated, 02/01/17  Ryan Shepard is a 76 y.o. male with COPD, hx of tobacco use disorder, PAD s/p femoral graft, HTN, DM2, HLD, A.fib, GERD, depression, anxiety, chronic pain, who presents with intermittent SOB for the past week prior to admission. Patient stated he noticed ''something felt funny'' and became concerned so he called the paramedics. He had been on home supplemental oxygen for the past 3 years (3L NC) and noticed his SpO2 was 80% the day prior. He states he has been sleeping sitting up for several years and endorses difficulty laying flat. Patient and wife also report increased swelling in his legs for the past 2 weeks PTA in which he states diuretics as needed. He was recently admitted to an outside hospital in [place], [place] for what he presumed was pneumonia. Per patient, he was admitted to the ICU for a SpO2 of 67% but did not require intubation. Patient is unsure of many of the medications he is taking. Of note patient's 45 yo grandson recently had a URI.   ???  Patient denied fever, chills, night sweats, NVCD, abdominal pain, or dysuria/hematuria.     HOSPITAL COURSE:   Patient presented to [facility] ED 12/6 with one week dyspnea; initial exam notable for labored breathing, wheezing, lower extremity edema; CXR notable for LUL consolidation. Treated multifactorially for CAP (levofloxacin 12/6-12), COPD exacerbation (prednisone 12/6-10), volume overload (daily lasix, total net -5L). Admission sputum cultures subsequently returned positive for aspergillus, with CT chest demonstrating biapical L>R consolidations with bulky intrathoracic lymphadenopathy. ID and Pulmonology consulted for further workup, and bronchoscopy with BAL completed 12/13. Patient following biopsy developed increasing oxygen requirements with hypercapnia (50 -> 70) and relative hypotension prompting MICU transfer.  ???  Patient upon transfer was placed on HFNC 40L/40% with improvement in work of breathing. Treated empirically with vanc/zosyn/voriconazole (12/13-14) as well as scheduled nebulizers and gentle diuresis. Maintained stable O2 requirement and well-compensated hypercapnia over 48h monitoring. Given clinical stability and negative cultures, antibiotics narrowed to ceftriaxone/flagyl (12/15); voriconazole discontinued 12/16 per ID recommendations. Patient moved to floor 12/15 on HFNC 35L/40%, and was weaned down on the general medicine service. He was continued on IV methylprednisolone and broad spectrum abx and  eventually transitioned to 4L Nasal Cannula. He completed a 14-day course of antibiotics and was diuresed initially with IV and then transitioned to PO 40mg  twice daily upon discharge.    Pt was further worked up for the LAD, pathology from BAL was negative as was further aspergillus testing. There was concern for the LAD to be malignant given prior hx of prostate CA, CT of the abd revealed hyperdense liver masses concerning for metastasis. Given pts poor recovery from previous bronchoscopy, further bronch w EBUS deferred for outpt liver biopsy. His plavix--for which he was on for PAD--was held in anticipation of biopsy.     Towards the end of his hospital course, pt had fluctuating leukocytosis, peaked at 15, without other signs of infection. His WBC count downtrended to 12.04 on discharge. This was of uncertain significance, however he remained HDS and afebrile. Pt was discharged on Trelegy elipta combo inhaler (LAMA/LABA/ICS) and home oxygen/nebulizers as needed.     Course was notable for persistent hypertension that required initiation of nifedipine daily for better anti-HTN.  ???  The patient was tolerating an oral diet, respirating well on 4L Nasal Cannula, and ambulating without difficulty at the time of discharge. No questions were left unanswered. The patient's discharge plan was explained and the patient displayed understanding.   CONSULTATIONS:   Pulmonology    SIGNIFICANT DIAGNOSTIC STUDIES/PROCEDURES:   Resp cx (fungal) 02/13/17: candida   12/13 BAL:  ??????AFB NTD  ??????Bact NTD  ??????Fungal: Candida  ??????GS: negative  ??????Biofire: negative  Aspergillus Fumigatus 02/05/17: <0.10  Cryptococcal Ag blood 02/06/17: negative  Bacterial Culture Respiratory 02/01/17: Few Probable candida albicans , Aspergillus [place]???     CT Chest w/ contrast 02/15/17  1. ??????Redemonstration of coalescent airspace and nodular consolidation, most prominent in the left upper lobe and lingula with peripheral groundglass associated with Unchanged to slightly more prominent mediastinal and bilateral hilar lymphadenopathy.   This finding is nonspecific but concerning for malignancy versus pneumonia with etiologies including fungal infection.   2. ???Increase in size of pleural and pericardial effusions and anasarca consistent with volume overload. 3. ??????Dilated main pulmonary artery measuring 39 mm reflecting pulmonary arterial hypertension.  4. ???Severe coronary artery calcifications.  ???  CT Abd/Pelvis w/contrast 02/15/17  History of prostate cancer treated with total prostatectomy with at least 4 hypervascular hepatic masses measuring up to 5 cm in the right hepatic lobe, suspicious for metastatic disease. No abdominal or pelvic lymphadenopathy. Severe atherosclerotic disease with thrombosed left common, external, and femoral artery, and patent femorofemoral bypass.     DISCHARGE PHYSICAL EXAM:   Vital Signs: BP 136/68  ~ Pulse 92  ~ Temp 36.4 ???C (97.5 ???F) (Axillary)  ~ Resp 17  ~ Ht 1.854 m (6' 0.99'')  ~ Wt (!) 103 kg (227 lb)  ~ SpO2 (!) 89%  ~ BMI 29.96 kg/m???   Constitutional: No acute distress  Gen: no acute distress, appears comfortable, sitting up in bed, NC in place  HEENT: mmm, anicteric sclerae  CV: RRR, no murmurs   Pulm: course breath sounds bilaterally, crackles bilaterally, no wheezing, normal work of breathing  Abd: NABS, soft, flat, NT, no masses or organomegaly  Skin: Warm and dry, no rashes  Ext: no lower extremity edema   Neuro: A&Ox4, CNII-XII intact, motor 5/5 throughout, sensation grossly intact  Psych: normal mood, appropriate affect     DISPOSITION:   Home with home health    DISCHARGE CONDITION:   Stable    PATIENT INSTRUCTIONS:  Current Discharge Medication List      START taking these medications    Details   albuterol (2.5 mg/97mL) 0.083% nebulizer solution Inhale contents of 1 ampulr (62mL=2.5 mg total) by nebulization every four (4) hours as needed.  Qty: 75 mL, Refills: 3      Fluticasone-Umeclidin-Vilant (TRELEGY ELLIPTA) 100-62.5-25 MCG/INH AEPB Inhale 1 puff daily.  Qty: 60 each, Refills: 0      furosemide 40 mg tablet Take 1 tablet (40 mg total) by mouth two (2) times daily.  Qty: 60 tablet, Refills: 0      gabapentin 400 mg capsule Take 2 capsules (800 mg total) by mouth three (3) times daily. Qty: 180 capsule, Refills: 0      NIFEdipine (ADALAT CC) 30 mg 24 hr tablet Take 1 tablet (30 mg total) by mouth daily.  Qty: 30 tablet, Refills: 0      senna 8.6 mg tablet Take 1 tablet by mouth at bedtime as needed for Constipation.  Qty: 30 tablet, Refills: 0         CONTINUE these medications which have CHANGED    Details   allopurinol 100 mg tablet Take 1 tablet (100 mg total) by mouth daily.  Qty: 30 tablet, Refills: 0      apixaban 5 mg tablet Take 1 tablet (5 mg total) by mouth two (2) times daily.  Qty: 60 tablet, Refills: 0      busPIRone 15 mg tablet Take 1 tablet (15 mg total) by mouth two (2) times daily.  Qty: 60 tablet, Refills: 0      carvedilol 25 mg tablet Take 1 tablet (25 mg total) by mouth two (2) times daily.  Qty: 60 tablet, Refills: 0      DULoxetine 60 mg DR capsule Take 1 capsule (60 mg total) by mouth daily.  Qty: 30 capsule, Refills: 0      linagliptin-metFORMIN (JENTADUETO) 2.06-998 MG TABS Take 1 tablet by mouth two (2) times daily.  Qty: 60 tablet, Refills: 0      losartan 100 mg tablet Take 1 tablet (100 mg total) by mouth daily.  Qty: 30 tablet, Refills: 0      pantoprazole 40 mg DR tablet Take 1 tablet (40 mg total) by mouth at bedtime.  Qty: 30 tablet, Refills: 0      pravastatin 40 mg tablet Take 1 tablet (40 mg total) by mouth at bedtime.  Qty: 30 tablet, Refills: 0         CONTINUE these medications which have NOT CHANGED    Details   albuterol 90 mcg/act inhaler Inhale 2 puffs every four (4) hours as needed.      hydrocodone-acetaminophen (NORCO) 10-325 mg tablet Take 1 tablet by mouth every four (4) to six (6) hours .  Refills: 0      LORazepam 2 mg tablet Take 2 mg by mouth every four (4) hours as needed.         STOP taking these medications       ALPRAZolam 2 mg tablet        amiodarone 200 mg tablet        amoxicillin-clavulanate 875-125 mg tablet        clopidogrel (PLAVIX) 75 mg tablet        digoxin 0.25 mg tablet mometasone-formoterol (DULERA) 200-5 mcg/dose inhalation        torsemide (DEMADEX) 20 mg tablet        gabapentin (NEURONTIN) 800 mg tablet  vitamin B-6 (PYRIDOXINE) 100 mg tablet            Activity: activity as tolerated  Diet: cardiac diet  Wound Care: none needed    Follow Up Appointments:  Future Appointments  Date Time Provider Department Center   02/13/2017 9:30 AM Sheppard Coil, MD [facility] [phone] MEDICINE   03/01/2017 8:00 AM Brayton El., MD PULM [facility] MEDICINE   03/08/2017 8:00 AM Jearld Pies., MD SUR HEPA 214 SURGERY   03/09/2017 1:00 PM Dagoberto Reef., MD ANS SM PAIN Anesthesia       LACE+ Risk Stratification    Total Score Risk Stratification   0-28 Minimal Risk   29-58 Moderate Risk   59-78 High Risk   79-90 Highest Risk     Readmission Score            69       Total Score        3 Male Patient    15 Urgent Admission    9 Length of Stay    42 Charlson Score (by age & number of urgent admissions)        Criteria that do not apply:    Discharge Institution    Alternative Level of Care Status    ED Visits in Previous 6 Months    Elective Admission in Previous Year            PENDING LABS:   none    OUTPATIENT WORKUP NEEDED:   - Interventional Radiology for arrangement of CT Guided biopsy of liver lesions  - needs preoperative evaluation prior to biopsy  - pulmonary follow up for COPD  - establish care with [facility] for continued management of multiple medical comorbidities    Signed: Dione Housekeeper, M.D., 02/21/2017 1:28 PM  Internal Medicine PGY-3, Pager: [phone]    Discussed with attending, Dr. Chancy Hurter

## 2017-02-22 ENCOUNTER — Telehealth: Payer: MEDICARE

## 2017-02-22 ENCOUNTER — Other Ambulatory Visit (HOSPITAL_COMMUNITY): Payer: Self-pay | Admitting: Family Medicine

## 2017-02-22 DIAGNOSIS — F17210 Nicotine dependence, cigarettes, uncomplicated: Secondary | ICD-10-CM

## 2017-02-22 LAB — Glucose,POC: GLUCOSE,POC: 197 mg/dL — ABNORMAL HIGH (ref 65–99)

## 2017-02-22 MED ADMIN — INSULIN ASPART 100 UNIT/ML SC SOPN: 3 mL | SUBCUTANEOUS | @ 01:00:00 | Stop: 2017-02-22 | NDC 00169633910

## 2017-02-22 MED ADMIN — IPRATROPIUM BROMIDE 0.02 % IN SOLN: 500 ug | RESPIRATORY_TRACT | @ 01:00:00 | Stop: 2017-02-22 | NDC 00487980101

## 2017-02-22 MED ADMIN — HYDROCODONE-ACETAMINOPHEN 5-325 MG PO TABS: 2 | ORAL | @ 02:00:00 | Stop: 2017-02-22 | NDC 00406012362

## 2017-02-22 MED ADMIN — ALBUTEROL SULFATE (5 MG/ML) 0.5% IN NEBU: 2.5 mg | RESPIRATORY_TRACT | @ 01:00:00 | Stop: 2017-02-22 | NDC 00487990130

## 2017-02-22 MED ADMIN — FUROSEMIDE 40 MG PO TABS: 40 mg | ORAL | Stop: 2017-02-22 | NDC 51079007320

## 2017-02-22 MED FILL — TRELEGY ELLIPTA 100-62.5-25 MCG/INH IN AEPB: RESPIRATORY_TRACT | 56 days supply | Qty: 56 | Fill #0

## 2017-02-22 MED FILL — VENTOLIN HFA 108 (90 BASE) MCG/ACT IN AERS: 90 mcg/act | RESPIRATORY_TRACT | 30 days supply | Qty: 18 | Fill #0

## 2017-02-22 MED FILL — GNP SENNA LAX 8.6 MG PO TABS: 8.6 mg | ORAL | 30 days supply | Qty: 30 | Fill #0

## 2017-02-22 NOTE — Progress Notes
Pharmaceutical Services ??? Discharge Medication Counseling     Patient Name: Ryan Shepard  Medical Record Number: [id number]  Admit date: 02/01/2017 10:36 AM      Age: 76 y.o.  Sex: male  Allergies: Patient has no known allergies.    Preferred Pharmacy:   [facility] - [place], [place] - [address]  [address]  [address]  [place] [address]        I counseled the patient and spouse on the relevant discharge medications. Discharge prescriptions were delivered to bedside. The purpose, potential side effects, storage, missed doses and any special instructions related to each medication was discussed.    Discharge Medication List from AVS:     Changes To My Medications      START taking these medications      Dose Instructions   aerochamber plus flo-vu Misc   1 device   Use as directed for inhalation     * albuterol (2.5 mg/61mL) 0.083% nebulizer solution  Commonly known as:  Proventil, Ventolin  Replaces:  albuterol 90 mcg/act inhaler   2.5 mg   Inhale contents of 1 ampulr (76mL=2.5 mg total) by nebulization every four (4) hours as needed.     * albuterol 90 mcg/act inhaler  Commonly known as:  Proventil HFA, Ventolin HFA   2 puff   Inhale 2 puffs every six (6) hours as needed.     Fluticasone-Umeclidin-Vilant 100-62.5-25 MCG/INH Aepb  Commonly known as:  TRELEGY ELLIPTA   1 puff   Inhale 1 puff daily.     furosemide 40 mg tablet  Commonly known as:  Lasix   40 mg   Take 1 tablet (40 mg total) by mouth two (2) times daily.     gabapentin 400 mg capsule  Commonly known as:  Neurontin   800 mg   Take 2 capsules (800 mg total) by mouth three (3) times daily.     NIFEdipine 30 mg 24 hr tablet  Commonly known as:  Adalat CC   30 mg   Take 1 tablet (30 mg total) by mouth daily.     senna 8.6 mg tablet   1 tablet   Take 1 tablet by mouth at bedtime as needed for Constipation.        * This list has 2 medication(s) that are the same as other medications prescribed for you. Read the directions carefully, and ask your doctor or other care provider to review them with you.            CHANGE how you take these medications      Dose Instructions   apixaban 5 mg tablet  Commonly known as:  Eliquis  What changed:  how much to take   5 mg   Take 1 tablet (5 mg total) by mouth two (2) times daily.        CONTINUE taking these medications      Dose Instructions   allopurinol 100 mg tablet  Commonly known as:  Zyloprim   100 mg   Take 1 tablet (100 mg total) by mouth daily.     busPIRone 15 mg tablet  Commonly known as:  Buspar   15 mg   Take 1 tablet (15 mg total) by mouth two (2) times daily.     carvedilol 25 mg tablet  Commonly known as:  Coreg   25 mg   Take 1 tablet (25 mg total) by mouth  two (2) times daily.     DULoxetine 60 mg DR capsule  Commonly known as:  Cymbalta   60 mg   Take 1 capsule (60 mg total) by mouth daily.     HYDROcodone-acetaminophen 10-325 mg tablet  Commonly known as:  Norco   1 tablet   Take 1 tablet by mouth every four (4) to six (6) hours .     linagliptin-metFORMIN 2.06-998 MG Tabs  Commonly known as:  JENTADUETO   1 tablet   Take 1 tablet by mouth two (2) times daily.     LORazepam 2 mg tablet  Commonly known as:  Ativan   2 mg   Take 2 mg by mouth every four (4) hours as needed.     losartan 100 mg tablet  Commonly known as:  Cozaar   100 mg   Take 1 tablet (100 mg total) by mouth daily.     pantoprazole 40 mg DR tablet  Commonly known as:  Protonix   40 mg   Take 1 tablet (40 mg total) by mouth at bedtime.     pravastatin 40 mg tablet  Commonly known as:  Pravachol   40 mg   Take 1 tablet (40 mg total) by mouth at bedtime.                 Prescriptions      These medications were sent to [facility] [phone])  [address], [place] [address]    Hours:  Mon-Fri 8AM-9PM, Sat 8AM-7PM, Sun/Holidays 8AM-5PM (Closed 1PM-2PM for lunch) Phone:  [phone]   ??? aerochamber plus flo-vu Misc ??? albuterol (2.5 mg/53mL) 0.083% nebulizer solution  ??? albuterol 90 mcg/act inhaler  ??? allopurinol 100 mg tablet  ??? apixaban 5 mg tablet  ??? busPIRone 15 mg tablet - refill too soon per insurance rejection claim. Patient & wife notified.    ??? carvedilol 25 mg tablet  ??? DULoxetine 60 mg DR capsule  ??? Fluticasone-Umeclidin-Vilant 100-62.5-25 MCG/INH Aepb  ??? furosemide 40 mg tablet  ??? gabapentin 400 mg capsule  ??? losartan 100 mg tablet  ??? NIFEdipine 30 mg 24 hr tablet  ??? pantoprazole 40 mg DR tablet  ??? pravastatin 40 mg tablet  ??? senna 8.6 mg tablet     These medications were sent to [id number] [place], [place] - [address] AT [facility] [address]  [address] [place] [address]    Phone:  [phone]   ??? linagliptin-metFORMIN 2.06-998 MG Tabs - unavailable at this time at [facility]. Sent to [facility]     All meds reviewed and dispensed except for controls. MD claims to seek PCP for refills. All meds reviewed. LACE+ 69.    Royston Cowper, 02/21/2017, 4:53 PM   X 15/67.45 CC  X

## 2017-02-22 NOTE — Consults
FINAL DISCHARGE MULTIDISCIPLINARY NOTE  Department of Care Coordination      Admit UVOZ:[id number]  Anticipated Date of Discharge: 02/22/2017    Following HK:[id number], Selmer Dominion., MD    Home Address:[address]  [place] [address]      DISCHARGE INFORMATION:     Discharge Address: [address] [place] [address]    Individual(s) notified of discharge plan:  Contact Name: Hulet, Ehrmann Relationship: Self   Contact Number(s): [phone]/ [phone]      Support Systems: Spouse       Medicare Important Message Provided: Yes       Aidin Choice List: Provided to Pt/Family Date Provided: 02/05/17   Freedom of Choice: Educated and Provided, Home Health Agency       Final Discharge Needs: Home Durable Medical and/or Respiratory Therapy Equipment      Home Health Coordination (if applicable):   Agency Name: [facility]  Contact Person: Admissions via Aidin  Phone Number(s): [phone]  Fax Number: [phone]  Start of Care Date: 02/22/17  Services: Nursing, Physical Therapy  Comments: confirmed via AIDIN w/ [facility]      Home Durable Medical and/or Respiratory Equipment (if applicable):   Vendor:  ([facility] )  Equipment Provided: portable oxygen (concentrator @ home prior to hospitalization) and nebulizer  Delivery Arrangements: delivery to bedside by 1700  Comments: Confirmed with [facility] via phone [phone] fax [phone]      Herbie Baltimore,  02/21/2017

## 2017-02-22 NOTE — Other
Patients Clinical Goal:   Clinical Goal(s) for the Shift: SaO2 >92%, pain mgt, BS: 80-140, free from falls   Identify possible barriers to advancing the care plan: None  Stability of the patient: Moderately Stable - low risk of patient condition declining or worsening   End of Shift Summary: AxOx4, VSS. Pt is on 4-6L NC, or 4 L on Facemask because pt breaths through mouth.  Off oxygen, pt desats to 84 %, NC placed back on and pt cont to desat to 79% before moving back up to 88%. PIV accidentally removed by pt this morning when MgSul was infusing for 5 mins. Per MD order, ok to take out PIV, pt being discharge today.     1630: Vikki, Outpt Pharm went over all of pt's discharge medications. Attending MD went over discharge instructions and f/u appointments. RN reviewed info with pt. Pt and pt's wife had no questions or concerns regarding discharge.    1800: pt finished dinner, wanted oral pain medication before going home because he has a long ride home. Educated pt about influence of pain medication, pt understood. Per wife, she will be driving pt home. Pt and pt's wife are reliable. Portable oxygen for ride home arrived. Ok for nebulizer and oxygen concentrator to be delivered to home tomorrow per Systems analyst.     1830: pt is dressed, and ready to be discharge. House escort requested. All belonging, meds and equipment with pt.

## 2017-02-22 NOTE — Telephone Encounter
Confirmed 01/07 @ 1:00 pm (PULM) with patient in room and informed him that appt with Dr. Desma Maxim has been cancelled.

## 2017-02-22 NOTE — Telephone Encounter
Patient Ryan Shepard [id number] is re-scheduled for     Is the patient being discharged to SNF? No  Clinic/Test/Procedure to be scheduled:[facility]  Doctor Preferences (If applicable): Dr. Nicki Reaper  Time Frame: 3 weeks  Reason for Appointment/Procedure: establish care    Dr. Nicki Reaper on 03/20/2017 @ 10:30 am   [place]    Is the patient being discharged to SNF? No  Clinic/Test/Procedure to be scheduled:Pulmonary  Doctor Preferences (If applicable): Dr. Jeronimo Greaves  Time Frame: 1 week    Dr. Jeronimo Greaves on 03/05/2017 @ 1:00 pm   [place] confirmed with patient room [phone]  Resident confirmed pmd appointment     Mailed out confirmation letters     Thanks

## 2017-02-23 ENCOUNTER — Ambulatory Visit: Payer: MEDICARE

## 2017-02-23 ENCOUNTER — Ambulatory Visit: Payer: PRIVATE HEALTH INSURANCE

## 2017-02-23 ENCOUNTER — Inpatient Hospital Stay: Admit: 2017-02-23 | Discharge: 2017-03-30 | Disposition: E | Payer: PRIVATE HEALTH INSURANCE | Source: Home / Self Care

## 2017-02-23 ENCOUNTER — Telehealth: Payer: PRIVATE HEALTH INSURANCE

## 2017-02-23 DIAGNOSIS — Z7689 Persons encountering health services in other specified circumstances: Secondary | ICD-10-CM

## 2017-02-23 DIAGNOSIS — R0602 Shortness of breath: Secondary | ICD-10-CM

## 2017-02-23 DIAGNOSIS — E119 Type 2 diabetes mellitus without complications: Secondary | ICD-10-CM

## 2017-02-23 LAB — Sputum Screen: SPUTUM SCREEN: >10

## 2017-02-23 LAB — Sepsis Lactate Repeat: BLOOD LACTATE: 22 mg/dL (ref 5–25)

## 2017-02-23 LAB — Blood Culture Detection
BLOOD CULTURE FINAL STATUS: NEGATIVE
BLOOD CULTURE FINAL STATUS: NEGATIVE
BLOOD CULTURE FINAL STATUS: NEGATIVE
BLOOD CULTURE FINAL STATUS: NEGATIVE

## 2017-02-23 LAB — Blood Gases,venous: PCO2,VENOUS: 63 mmHg

## 2017-02-23 LAB — Extra Gold Top

## 2017-02-23 LAB — CBC: MEAN PLATELET VOLUME: 9.5 fL (ref 9.3–13.0)

## 2017-02-23 LAB — Prothrombin Time Panel: PROTHROMBIN TIME: 16.2 s — ABNORMAL HIGH (ref 11.5–14.4)

## 2017-02-23 LAB — Electrolyte Panel: ANION GAP: 11 mmol/L (ref 8–19)

## 2017-02-23 LAB — Sepsis Lactate Protocol: BLOOD LACTATE: 26 mg/dL — ABNORMAL HIGH (ref 5–25)

## 2017-02-23 LAB — CREATININE: CREATININE: 1.3 mg/dL (ref 0.60–1.30)

## 2017-02-23 LAB — Glucose,POC
GLUCOSE,POC: 242 mg/dL — ABNORMAL HIGH (ref 65–99)
GLUCOSE,POC: 134 mg/dL — ABNORMAL HIGH (ref 65–99)

## 2017-02-23 LAB — Blood Gases, arterial: PO2: 78 mmHg — ABNORMAL LOW (ref 98–118)

## 2017-02-23 LAB — Glucose, Whole Blood: GLUCOSE, WHOLE BLOOD: 239 mg/dL — ABNORMAL HIGH (ref 65–99)

## 2017-02-23 LAB — B-Type Natriuretic Peptide: BNP: 56 pg/mL (ref <100)

## 2017-02-23 LAB — Troponin I: TROPONIN I: <0.04 ng/mL (ref <0.1)

## 2017-02-23 LAB — BLOOD BANK HOLD SPECIMEN

## 2017-02-23 LAB — Extra Red Top (Plastic)

## 2017-02-23 MED ADMIN — IPRATROPIUM BROMIDE 0.02 % IN SOLN: 500 ug | RESPIRATORY_TRACT | @ 20:00:00 | Stop: 2017-02-23 | NDC 00487980101

## 2017-02-23 MED ADMIN — SODIUM CHLORIDE 0.9 % IV BOLUS: 250 mL | INTRAVENOUS | @ 20:00:00 | Stop: 2017-02-23

## 2017-02-23 MED ADMIN — HEPARIN 25,000 UNITS/250 ML 0.45% NACL RTU: 1164 [IU]/h | INTRAVENOUS | Stop: 2017-02-23

## 2017-02-23 MED ADMIN — VANCOMYCIN 500 ML IVPB: 1500 mg | INTRAVENOUS | @ 21:00:00 | Stop: 2017-02-23 | NDC 47781059791

## 2017-02-23 MED ADMIN — LACTATED RINGERS IV BOLUS (SEPSIS): 1000 mL | INTRAVENOUS | @ 21:00:00 | Stop: 2017-02-23

## 2017-02-23 MED ADMIN — IPRATROPIUM BROMIDE 0.02 % IN SOLN: 500 ug | RESPIRATORY_TRACT | @ 23:00:00 | Stop: 2017-03-07

## 2017-02-23 MED ADMIN — ALBUTEROL SULFATE (5 MG/ML) 0.5% IN NEBU: 2.5 mg | RESPIRATORY_TRACT | @ 23:00:00 | Stop: 2017-03-07

## 2017-02-23 MED ADMIN — FUROSEMIDE 10 MG/ML IJ SOLN: 20 mg | INTRAVENOUS | Stop: 2017-02-23 | NDC 36000028225

## 2017-02-23 MED ADMIN — LEVOFLOXACIN 750 MG/150 ML RTU: 750 mg | INTRAVENOUS | @ 20:00:00 | Stop: 2017-02-23 | NDC 00143972024

## 2017-02-23 MED ADMIN — IPRATROPIUM BROMIDE 0.02 % IN SOLN: 500 ug | RESPIRATORY_TRACT | @ 23:00:00 | Stop: 2017-02-23 | NDC 00487980101

## 2017-02-23 MED ADMIN — ALBUTEROL SULFATE (5 MG/ML) 0.5% IN NEBU: 5 mg | RESPIRATORY_TRACT | @ 20:00:00 | Stop: 2017-02-23 | NDC 00487990130

## 2017-02-23 MED ADMIN — ALBUTEROL SULFATE (5 MG/ML) 0.5% IN NEBU: 5 mg | RESPIRATORY_TRACT | @ 23:00:00 | Stop: 2017-02-23 | NDC 00487990130

## 2017-02-23 MED ADMIN — PIPERACILLIN-TAZOBACTAM 3.375 G/50 ML RTU: 3.375 g | INTRAVENOUS | @ 21:00:00 | Stop: 2017-02-23 | NDC 00206886102

## 2017-02-23 NOTE — ED Notes
Pt appears to be asleep, on Bipap. O2 sat 85%-88%. NAD, will continue to monitor.

## 2017-02-23 NOTE — Telephone Encounter
NURSING CLINICAL NOTE FOR EMERGENCY DEPARTMENT TRANSFER    PATIENT: Ryan Shepard MRN: [id number]; DOB: 1940-10-14  AGE: 76 y.o.     VISIT DATE: 01/31/2017  PATIENT LOCATION: [place] [address] - Internal Medicine         DIAGNOSIS: COPD/ Hypoxia   REFERRING MD: No att. providers found     TRANSFER LOCATION  [x]  [facility] (offer [facility] ED or [facility] ETC transfer to MD, if patient is stable)   []  [place]  []  911  []  Escort - Wheelchair  []  Escort - [place]  []  Escorted by MD and/or Nurse  []  (BLS) Non-Monitored  [x]  (CCT) Monitored  []  Walk-over  []  Drive-over  []  Paramedic: Outsourced  []  BLS []  CCT          SUBMITTED BY: Gary Fleet, LVN on 01/29/2017 at 10:24 AM

## 2017-02-23 NOTE — Progress Notes
Internal Medicine Outpatient Progress Note    Date of Service: 03/06/2017  History:   CC: 76 y.o. male    History of Present Illness/Interval History:  76 y.o. 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 At Discharge, was documented at 89% O2. Since discharge, patient feels tired, mildly confused (worse in AM), and coughing (but says he doesn't have a baseline cough. He also reports decreased UOP, but says he hasn't been limiting his drinking of fluids. Denies any fevers, chills, non-compliance w/ meds, sick contacts.    Chronic conditions:  [] Problem list reviewed and updated.  Patient Active Problem List    Diagnosis Date Noted   ??? Chronic diastolic heart failure of unknown etiology (HCC/RAF) 02/19/2017   ??? Liver masses, hypervascular with history of prostate cancer 02/19/2017   ??? AF (paroxysmal atrial fibrillation) (HCC/RAF) 02/19/2017   ??? Acute and chronic respiratory failure with hypoxia (HCC/RAF) 02/14/2017   ??? COPD exacerbation (HCC/RAF) 02/01/2017   ??? COPD (chronic obstructive pulmonary disease) (HCC/RAF) 02/01/2017   ??? Chronic prescription opiate use 02/01/2017   ??? Lower extremity edema 02/01/2017   ??? Memory loss 02/01/2017   ??? Controlled type 2 diabetes mellitus with diabetic neuropathy (HCC/RAF)    ??? PAD (peripheral artery disease) (HCC/RAF)    ??? Hypertension, essential with worsening due to steroid use    ??? Hypercholesteremia    ??? GERD (gastroesophageal reflux disease)    ??? Primary osteoarthritis of knees, bilateral 04/30/2014   ??? Left shoulder pain 07/02/2013   ??? Left knee pain 06/04/2013   ??? OA (osteoarthritis) of knee 06/11/2012   ??? 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 Lower back pain 03/17/2012       Reconciled medication list:  Current Outpatient Prescriptions   Medication ??? albuterol (2.5 mg/62mL) 0.083% nebulizer solution   ??? albuterol 90 mcg/act inhaler   ??? allopurinol 100 mg tablet   ??? apixaban 5 mg tablet   ??? busPIRone 15 mg tablet   ??? carvedilol 25 mg tablet   ??? DULoxetine 60 mg DR capsule   ??? Fluticasone-Umeclidin-Vilant (TRELEGY ELLIPTA) 100-62.5-25 MCG/INH AEPB   ??? furosemide 40 mg tablet   ??? gabapentin 400 mg capsule   ??? hydrocodone-acetaminophen (NORCO) 10-325 mg tablet   ??? linagliptin-metFORMIN (JENTADUETO) 2.06-998 MG TABS   ??? LORazepam 2 mg tablet   ??? losartan 100 mg tablet   ??? NIFEdipine (ADALAT CC) 30 mg 24 hr tablet   ??? pantoprazole 40 mg DR tablet   ??? pravastatin 40 mg tablet   ??? senna 8.6 mg tablet   ??? Spacer/Aero-Holding Chambers (AEROCHAMBER PLUS FLO-VU) MISC       Allergies:  No Known Allergies    Additional PMH/FH/SH:  Tobacco:  reports that he has never smoked. He has never used smokeless tobacco.  family history includes Cancer in his father and mother.   reports that he has never smoked. He has never used smokeless tobacco. He reports that he drinks alcohol. He reports that he does not use drugs.    Review of Systems:   (2-9 ROS for level 3-4 visit)  Review of Systems   Constitutional: Negative.    Eyes: Negative.     (right click to edit)    Health Maintenance:  Health Maintenance   Topic Date Due   ???  Diabetes: FOOT EXAM  [birth year]18/1958   ??? Diabetes: EYE EXAM  05/18/1959   ??? Diabetes: HGB A1C  05/18/1959   ??? Diabetes: NEPHROPATHY MONITORING  05/18/1959   ??? Tdap/Td Vaccine (1 - Tdap) 05/17/1960   ??? Shingles (Shingrix) Vaccine (1 of 2) 05/18/1991   ??? Annual Preventive Wellness Visit  05/18/1991   ??? Colon Ca Screening: COLONOSCOPY  05/18/1991   ??? Pneumococcal Vaccine (1 of 2 - PCV13) 05/18/2006   ??? Influenza Vaccine (1) 10/28/2016         There is no immunization history on file for this patient.  Physical Exam:   Vitals:   BP 98/50 (BP Location: Right arm, Patient Position: Sitting, Cuff Size: Regular)  ~ Pulse 77  ~ Temp 36.4 ???C (97.5 ???F) (Oral)  ~ Resp 18  ~ Ht 6' 1'' (1.854 m)  ~ Wt 214 lb (97.1 kg)  ~ SpO2 (!) 73% Comment: 73% to 77, Pt. on 4 L O2 ~ BMI 28.23 kg/m???   Wt Readings from Last 3 Encounters:   02/06/2017 214 lb (97.1 kg)   02/18/17 (!) 227 lb (103 kg)   04/16/14 (!) 242 lb (109.8 kg)     General: WDWN male, chronically ill appearing, NAD, appears mildly fatigued  HEENT: normocephalic, atraumatic, MMM, oropharynx w/o any tonsillar erythema or exudates  Cardiac: RRR, normal S1/S2, no additional sounds  Pulmunary: distant sounds RLL crackles, otherwise CTAB, no additional sounds  Abdominal: Soft, NTND, no abdominal masses, no rebound or guarding, +BS  Extremities: WWP, 2+ radial pulses bilaterally, no LEE      System Check if Normal Positive or additional negative findings   Eyes  [x]  Conj/Lids []  Pupils  []  Fundi     ENMT  [x]  External ears/nose []  Otoscopy   []  Hearing []  Nasal mucosa   []  Lips/teeth/gums []  Oropharynx     Neck  [x]  Inspection/palpation []  Thyroid     Breast  []  Inspection []  Palpation     GU  M: []  Scrotum []  Penis []  Prostate   F:  []  External []  Bladder []  Cervix         []  Uterus    []  Adnexa      Lymph  []  Neck []  Axillae []  Groin     MS Specify site examined:    []  Inspect/palp []  ROM   []  Stability []  Strength/tone         Skin  [x]  Inspection []  Palpation     Psych  [x]  Insight/judgement     [x]  Mood/affect        Data:   Lab Review:   No results found for: TSH   Lab Results   Component Value Date    WBC 12.04 (H) 02/21/2017    HGB 14.4 02/21/2017    HCT 45.5 02/21/2017    MCV 91.5 02/21/2017    PLT 183 02/21/2017     Lab Results   Component Value Date    CREAT 0.99 02/21/2017    BUN 19 02/21/2017    NA 134 (L) 02/21/2017    K 4.5 02/21/2017     No results found for: CHOL, CHOLHDL, CHOLDLQ, CHOLDLCAL, TRIGLY   Studies:  CT Chest 02/15/2017:  1.   Redemonstration of coalescent airspace and nodular consolidation, most prominent in the left upper lobe and lingula with peripheral groundglass associated with Unchanged to slightly more prominent mediastinal and bilateral hilar lymphadenopathy.   This finding is nonspecific but concerning for malignancy versus pneumonia with etiologies  including fungal infection.   2.  Increase in size of pleural and pericardial effusions and anasarca consistent with volume overload.  3.   Dilated main pulmonary artery measuring 39 mm reflecting pulmonary arterial hypertension.  4.  Severe coronary artery calcifications.   Assessment/Plan:   76 y.o. male who presents for management of the following medical conditions:    #Hypoxemic Respiratory Failure: Feels tires, mildly confused since discharge 2 days ago. Satting 73% on 4L at presentation. Appears mildly tired. Likely component of hypercarbia as well  - Admit to [facility] ED, transport by ambulance    Diagnoses and all orders for this visit:    Encounter to establish care with new doctor         Follow-up: Return After discharge from hospital.    The above plan of care, diagnosis, orders, and follow-up were discussed with the patient.??? Questions related to this recommended plan of care were answered. See AVS for additional information and counseling materials provided to the patient.    Discussed with Attending: Dr. Jimmey Ralph    Signed, Sheppard Coil, MD 02/22/2017 10:31 AM

## 2017-02-23 NOTE — ED Notes
RT at bedside for BiPap set up.

## 2017-02-23 NOTE — Patient Instructions
Please go to Emergency room by ambulance, we are arranging transportation

## 2017-02-23 NOTE — ED Notes
Report given to Dee, RN 

## 2017-02-23 NOTE — ED Notes
ED resident Shawnie Pons and Attending Burt Knack aware of blood pressure difference between right and left arm.

## 2017-02-23 NOTE — ED Notes
CXR at bedside

## 2017-02-23 NOTE — H&P
MICU History and Physical     PMD: Sheppard Coil, MD  DATE OF SERVICE: 02/24/2017  HOSPITAL DAY: 1  CHIEF COMPLAINT: Shortness of Breath (at clinic sating 77% on 4L +Cough yellow sputum +wheezing )    History of Present Illness   Ryan Shepard is a 76 y.o. male with a history of COPD (on 3LNC), HFpEF, afib, HTN, DM2, PAD s/p femoral graft, prior prostate CA who was admitted to the ICU for hypoxemic respiratory failure requiring BiPAP.    Patient was recently hospitalized and discharged 12/26 for similar issue requiring aggressive bronchodilation and bronchoscopy and was also treated for COPD exacerbation.    Patient was admitted to ED this AM directly from clinic d/t incr respiratory distress and sats in low 70's on home 4L required. On admission to ED vitals were notable for sats in low 80s on home O2 requirement. Patient's BP was noted to be lower in R arm (SBP 90) compared to left arm (SBPs 110-120s), however patient has no CP. CXR was stable from prior and showed unchanged mediastinal adenopathy and left hilar mass extending into upper lobe and lower lobe with left lymphangitic carcinomatosis, unchanged moderate left pleural effusion. Patient was placed on BiPAP titrated up to FiO2 60%, 12/5 and ABG was 7.4/45/72/27. Patient was given duonebs x 1, empiric doses of vanc and zosyn, 20mg  IV lasix and transferred to ICU for further management.    In ICU, patient was weaned to HFNC 60/60 from BiPAP.    On further history, patient reports feeling mildly SOB but otherwise feels well. Reports increased sputum x 2 months occasionally scant amounts of brown, worsening over past month. Patient states that he felt back to his baseline when discharged home from hospital on 12/26 (home O2 requirement of 4L). He went to clinic 12/28 AM for routine follow-up where he was noted to have desats but he reports feeling ''fine.'' To his knowledge has taken all of his home medications including lasix, however he was only home for 1 day from discharge and his wife helps him with medications. Former smoker and but has not smoked in over 25 years.  Does endorse mild right lower leg pain but denies swelling. No h/o clots, is on [facility] for afib. Denies chest pain, F/chills, weight loss/gain, N/V, abd pain, recent travel, orthopnea, hemoptysis.    Interval Events:  -Put back on BiPAP since 4am from HFNC ABG 7.4/43/65, bicarb 25.  -On BiPAP 60%/70, 10/5 (set RR 8 but breathing 13-18). AM ABG 7.42/42/69, bicarb 26.5 on   -AM CXR more fluid overload, given lasix  -Afebrile (tmax 36.9), HR 80s, BP Maps 70-80 with SBP 100/60s, sats 88-92%  -I/O, negative 300    Subjective:  -Appears uncomfortable, retractions    Past Medical History     Past Medical History:   Diagnosis Date   ??? Cancer (HCC/RAF)    ??? Diabetes (HCC/RAF)    ??? GERD (gastroesophageal reflux disease)    ??? Hypercholesteremia    ??? Hypertension    ??? PAD (peripheral artery disease) (HCC/RAF)    ??? 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 Prostate disease    ??? Scoliosis     adolesent   ??? Vascular disease        Past Surgical History     Past Surgical History:   Procedure Laterality Date   ??? BACK SURGERY     ??? BILATERAL FEMORAL ARTERY EXPLORATION  2009   ???  BLADDER SURGERY  1999   ??? PROSTATE SURGERY  2000       Family History     Family History   Problem Relation Age of Onset   ??? Cancer Mother    ??? Cancer Father    ??? Malignant hyperthermia Neg Hx        Social History     Social History     Social History   ??? Marital status: Married     Spouse name: N/A   ??? Number of children: N/A   ??? Years of education: N/A     Social History Main Topics   ??? Smoking status: Never Smoker   ??? Smokeless tobacco: Never Used   ??? Alcohol use 0.0 oz/week   ??? Drug use: No   ??? Sexual activity: Not Asked     Other Topics Concern   ??? None     Social History Narrative   ??? None       Allergies   No Known Allergies    Home Medications Medications that the patient states to be currently taking   Medication Sig   ??? albuterol (2.5 mg/86mL) 0.083% nebulizer solution Inhale contents of 1 ampulr (19mL=2.5 mg total) by nebulization every four (4) hours as needed.   ??? albuterol 90 mcg/act inhaler Inhale 2 puffs every six (6) hours as needed.   ??? allopurinol 100 mg tablet Take 1 tablet (100 mg total) by mouth daily.   ??? apixaban 5 mg tablet Take 1 tablet (5 mg total) by mouth two (2) times daily.   ??? busPIRone 15 mg tablet Take 1 tablet (15 mg total) by mouth two (2) times daily.   ??? carvedilol 25 mg tablet Take 1 tablet (25 mg total) by mouth two (2) times daily.   ??? DULoxetine 60 mg DR capsule Take 1 capsule (60 mg total) by mouth daily.   ??? Fluticasone-Umeclidin-Vilant (TRELEGY ELLIPTA) 100-62.5-25 MCG/INH AEPB Inhale 1 puff daily.   ??? furosemide 40 mg tablet Take 1 tablet (40 mg total) by mouth two (2) times daily.   ??? gabapentin 400 mg capsule Take 2 capsules (800 mg total) by mouth three (3) times daily.   ??? hydrocodone-acetaminophen (NORCO) 10-325 mg tablet Take 1 tablet by mouth every four (4) to six (6) hours .   ??? linagliptin-metFORMIN (JENTADUETO) 2.06-998 MG TABS Take 1 tablet by mouth two (2) times daily.   ??? LORazepam 2 mg tablet Take 2 mg by mouth every four (4) hours as needed.   ??? losartan 100 mg tablet Take 1 tablet (100 mg total) by mouth daily.   ??? NIFEdipine (ADALAT CC) 30 mg 24 hr tablet Take 1 tablet (30 mg total) by mouth daily.   ??? pantoprazole 40 mg DR tablet Take 1 tablet (40 mg total) by mouth at bedtime.   ??? pravastatin 40 mg tablet Take 1 tablet (40 mg total) by mouth at bedtime.   ??? senna 8.6 mg tablet Take 1 tablet by mouth at bedtime as needed for Constipation.   ??? Spacer/Aero-Holding Chambers (AEROCHAMBER PLUS FLO-VU) MISC Use as directed for inhalation       Physical Exam   Temp:  [36.2 ???C (97.2 ???F)-36.9 ???C (98.4 ???F)] 36.8 ???C (98.2 ???F)  Heart Rate:  [69-84] 82  Resp:  [10-22] 17  BP: (63-121)/(48-77) 105/66 NBP Mean:  [56-90] 79  FiO2 (%):  [50 %-80 %] 60 %  SpO2:  [73 %-94 %] 92 %  I/O: I/O last 2 completed  shifts:  In: 1631.3 [I.V.:131.3; IV Piggyback:1500]  Out: 1950 [Urine:1950]    General - Mild distress, sitting up in bed  Neuro - A&Ox4, no FND  HEENT - PERRL, EOMI, MMM  Cardiovascular - RRR no m/r/g, unable to assess JVP, 2+ radial pulses b/l  Lungs - Increased WOB, tachypnic. Decreased breath sounds LLL, mild anterior expiratory wheezing.  Skin - No visible rashes or lesions  Abdomen - Soft, NT/ND  Extremeties - Warm and well-perfused. Trace to no pre-tibial edema. 1++ pitting edema over ankles b/l    Labs   CBC  Recent Labs      02/24/17   0309  02/01/2017   1139   WBC  7.74  10.72*   HGB  10.8*  13.6   HCT  33.2*  42.2   MCV  87.4  90.6   PLT  169  207     BMP  Recent Labs      02/24/17   0304  02/22/2017   1139   NA  137  129*   K  4.0  4.7   CL  97  93*   CO2  25  25   BUN  22   --    CREAT  0.92  1.30   CALCIUM  8.1*   --    MG  1.5   --      Coags  Recent Labs      01/28/2017   1139   INR  1.4   PT  16.2*       Microbiology   Sputum culture (12/29): pending  RPAN (12/29): pending    Imaging     CXR (12/29):  IMPRESSION:  Unchanged enlarged cardiomediastinal silhouette from combination of mediastinal adenopathy and left hilar mass extending into upper lobe, and lower lobe with left lymphangitic carcinomatosis. Unchanged moderate left pleural effusion.  ???  CT Chest W/o Contrast (12/20):  IMPRESSION:  1.   Redemonstration of coalescent airspace and nodular consolidation, most prominent in the left upper lobe and lingula with peripheral groundglass associated with Unchanged to slightly more prominent mediastinal and bilateral hilar lymphadenopathy.   This finding is nonspecific but concerning for malignancy versus pneumonia with etiologies including fungal infection.   2.  Increase in size of pleural and pericardial effusions and anasarca consistent with volume overload. 3.   Dilated main pulmonary artery measuring 39 mm reflecting pulmonary arterial hypertension.  4.  Severe coronary artery calcifications.     Assessment and Plan   Ryan Shepard is a 76 y.o. male with a history of COPD (on 3LNC), HFpEF, afib, HTN, DM2, PAD s/p femoral graft, prior prostate CA who was admitted for hypoxemic respiratory failure requiring BiPAP.    #Acute on chronic hypoxemic respiratory failure  Admitted from clinic requiring BiPAP in setting of decreased arterial PaO2 and desats to low 80s. Recently admitted w/ bronch showing chronic colonization with aspergillus. Has features c/f malignancy on imaging.  - BiPAP wean to HFNC as tolerated, goal SpO2 88-92%  - Serial VBGs  - Malignancy work-up: BALs as below  - Empiric IV Vanc, Zosyn, as below. BAL, holding heparin  - Azithromycin  - CTM BPs, asymmetric in arms but low c/f aortic pathology given absence of CP    #Leukocytosis  Afebrile. Increased sputum production x 2 months otherwise no localizing signs of infection. Did receive course of methylprednisolone 12/18-12/23 on recent hospitalization.  - F/u sputum culture, blood cultures  - Empiric IV vanc/Zosyn    #  COPD  Increased sputum production x 2 months, slight wheezing and recently treated with steroids while hospitalized. Current presentation less c/w COPD exacerbation.  - Duonebs q4h  - Consider steroid course  - Empiric abx, as above    #HFpEF  Home dose lasix PO 40mg  BID  - S/p 20mg  IV lasix  - IV lasix PRN to maintain goal slightly net negative  - F/u BNP  - BID lytes, daily weights, strict I&O's  - TTE ordered    - inhaled steroids nebulizer      #Hypervascular Liver Lesions/Mediastinal LAD- c/f malignancy, hx prostate cancer treated with total prostatectomy.  - Consider inpatient work-up for malignancy. Current presentation more amenable to IR biopsy of liver lesions, EBUS less given location of lung lesions and prior complications s/p bronchoscopy d/t poor respiratory status    #Afib Rate-controlled. CHADS2VaSC 3-4  - Hold home carvedilol 25 mg BID  - Hold home apixaban 5 mg BID    #HTN  SBP soft in 90-100s.   - Hold home BP meds: losartan 100 mg daily, carvedilol 25 mg BID, nifedipine ER 30 mg daily    CHRONIC  #Chronic???pain   Localized to lower back and lower extremities  - norco 5-325 q6h PRN  - schedule outpatient pain clinic follow up     #GERD chronic/stable  - pantoprazole 40 mg daily   - IV pantoprazole  ???  #Depression/Anxiety  Mood stable, no SI, no anxiety  - will restart home meds pending confirmation from patient's wife of doses of home meds and adherence. Per documentation at last hospitalization; buspirone 15 mg BID, duloxetine DR 60    #PAD  s/p femoral-femoral graft 2009.  - holding clopidogrel 75 mg daily in anticipation of biopsy to work up LAD/Liver lesions  ???  #HLD chronic/stable  - pravastatin 40 mg nightly     #DM2 with associated diabetic peripheral neuropathy  - NPO while on BiPAP  - will restart gabapentin 800 mg TID     F - NPO while on Bipap  A - Norco for chronic pain  S - None  T - SQ heparin (in setting of AKI), lovenox H - 30 degrees  U - PPI  G - NPO    Disposition: ICU level care required for hypoxemic respiratory failure requiring BiPAP    Code Status: Full Code  Contact:  Primary Emergency Contact: Monty Spicher,Johnnie, Home Phone: [phone]    To be DWA Dr. Oletta Lamas, MD, PGY-1  [phone]

## 2017-02-23 NOTE — ED Notes
MICU at bedside. MD changed FiO2 to 60%. Pt O2 sat at 93%. Denies pain at this time. Pt states was never on BiPAP prior to this admission. 1L LR and 250 NS bolus given d/t hypotension and hx CHF.

## 2017-02-23 NOTE — ED Provider Notes
[facility]  Emergency Department Service Report    Triage     Ryan Shepard, a 76 y.o. male, presents with Shortness of Breath (at clinic sating 77% on 4L +Cough yellow sputum +wheezing )    Arrived on 01/27/2017 at 11:23 AM   Arrived by Hospital Transport [1]    ED Triage Vitals   Temp Temp src BP Heart Rate Resp SpO2 O2 Device Pain Score Weight   02/17/2017 1149 -- 02/24/2017 1133 02/20/2017 1133 02/07/2017 1133 02/13/2017 1133 02/20/2017 1133 01/27/2017 1136 02/21/2017 1134   36.9 ???C (98.4 ???F)  (!) 76/52 74 17 (!) 79 % Nasal cannula Zero 97.1 kg (214 lb)     No Known Allergies     Initial Physician Contact     Comprehensive Exam Initiated  Contact Date: 01/29/2017  Contact Time: 1132    History   HPI   Ryan Shepard is a 76 y.o. male with a history of COPD (on [facility]) with recent admission on 12/6, chronic diastolic heart failure, atrial fibrillation, HTN, DM2, PAD s/p femoral graft, prior prostate CA presenting with shortness of breath since this morning. Patient was at a post-discharge follow up this morning when it was noted he was satting at 70% on 4L of oxygen. Patient states he has shortness of breath at rest, but does not feel in distress in the ED. Endorses cough with yellow sputum and left leg swelling. No chest or abdominal pain. No fever, chills, nausea, vomiting or diarrhea.    Past Medical History:   Diagnosis Date   ??? Cancer (HCC/RAF)    ??? Diabetes (HCC/RAF)    ??? GERD (gastroesophageal reflux disease)    ??? Hypercholesteremia    ??? Hypertension    ??? PAD (peripheral artery disease) (HCC/RAF)    ??? 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 Prostate disease    ??? Scoliosis     adolesent   ??? Vascular disease         Past Surgical History:   Procedure Laterality Date   ??? BACK SURGERY     ??? BILATERAL FEMORAL ARTERY EXPLORATION  2009   ??? BLADDER SURGERY  1999   ??? PROSTATE SURGERY  2000        Past Family History   family history includes Cancer in his father and mother. Past Social History   he reports that he has never smoked. He has never used smokeless tobacco. He reports that he drinks alcohol. He reports that he does not use drugs. His sexual activity history is not on file.     Review of Systems   Constitutional: Negative for chills and fever.   Respiratory: Positive for cough, shortness of breath and wheezing.    Cardiovascular: Positive for leg swelling. Negative for chest pain.   Gastrointestinal: Negative for abdominal pain, diarrhea, nausea and vomiting.   All other systems reviewed and are negative.    Physical Exam   Physical Exam   Constitutional: He is oriented to person, place, and time. He appears well-developed and well-nourished.   HENT:   Head: Normocephalic and atraumatic.   Eyes: Conjunctivae are normal.   Neck: Neck supple.   Cardiovascular: Normal rate and regular rhythm.    Pulmonary/Chest: Effort normal. He has wheezes.   Decreased air movement bilaterally   Abdominal: Soft. He exhibits distension. There is no tenderness. There is no rebound and no guarding.   (+) well healed  incision scar   Musculoskeletal: Normal range of motion.   (+) 1+ pitting edema to left greater than right ankles   Neurological: He is alert and oriented to person, place, and time.   Skin: Skin is warm and dry.   Psychiatric: His behavior is normal.   Nursing note and vitals reviewed.    Medical Decision Making   Ryan Shepard is a 76 y.o. male with a history of COPD (on [facility]) with recent admission on 12/6, chronic diastolic heart failure, atrial fibrillation, HTN, DM2, PAD s/p femoral graft, prior prostate CA presenting with shortness of breath since this morning. Patient presenting with hypoxia. Noted to be hypotensive with BP however higher on the left arm than right arm. With recent CT, doubt aortic dissection as cause of BP. Bedside US showed plump IVC. No evidence of right heart strain. Will give broad spectrum IV antibiotics, fluids, and pursue infectious ischemic workup. Considering COPD as source of symptoms.    Chart Review   Previous medical records requested.  Pertinent items reviewed: PMH    ED Course      Laboratory Results     Labs Reviewed   ELECTROLYTE PANEL - Abnormal; Notable for the following:        Result Value    Sodium 129 (*)     Chloride 93 (*)     All other components within normal limits   GLUCOSE - Abnormal; Notable for the following:     Glucose 239 (*)     All other components within normal limits   CBC - Abnormal; Notable for the following:     White Blood Cell Count 10.72 (*)     All other components within normal limits   PROTHROMBIN TIME PANEL - Abnormal; Notable for the following:     Prothrombin Time 16.2 (*)     All other components within normal limits   SEPSIS LACTATE - Abnormal; Notable for the following:     Blood Lactate 26 (*)     All other components within normal limits   BLOOD GAS, ARTERIAL - Abnormal; Notable for the following:     pCO2 46 (*)     pO2 78 (*)     O2 Sat/Measured 94.7 (*)     All other components within normal limits   BACTERIAL CULTURE BLOOD. - Normal    Narrative:     TO OPTIMIZE ORGANISM RECOVERY (90-99%), SUBMIT 2-3 BLOOD CULTURE SETS IN 24 HOURS.   BACTERIAL CULTURE BLOOD. - Normal    Narrative:     TO OPTIMIZE ORGANISM RECOVERY (90-99%), SUBMIT 2-3 BLOOD CULTURE SETS IN 24 HOURS.   CREATININE,WHOLE BLOOD - Normal   BNP - Normal   SEPSIS LACTATE REPEAT - Normal   BACTERIAL CULTURE BLOOD    Narrative:     The following orders were created for panel order Blood culture #1.  Procedure                               Abnormality         Status                     ---------                               -----------         ------  Bacterial Culture WRUEA[[id number]]      Normal              Preliminary result           Please view results for these tests on the individual orders.   BACTERIAL CULTURE BLOOD    Narrative:     The following orders were created for panel order Blood culture #2. Procedure                               Abnormality         Status                     ---------                               -----------         ------                     Bacterial Culture YNWGN[[id number]]      Normal              Preliminary result           Please view results for these tests on the individual orders.   RESPIRATORY PATHOGEN PANEL PCR, RESPIRATORY LOWER   TROPONIN I   BLOOD GASES,VENOUS   RAINBOW DRAW TO LABORATORY    Narrative:     The following orders were created for panel order Rainbow Draw- No Defaults.  Procedure                               Abnormality         Status                     ---------                               -----------         ------                     Extra Red Top (Plastic)[[id number]]                          Final result               Pink Top-Blood Bank Hold.Marland KitchenMarland Kitchen[[id number]]                      Final result               Extra Gold MWU[[id number]]                                   Final result                 Please view results for these tests on the individual orders.   EXTRA RED TOP (PLASTIC)   PINK TOP-BLOOD BANK HOLD SPECIMEN   EXTRA GOLD TOP   URINALYSIS W/REFLEX TO CULTURE    Narrative:     The following orders were created for panel order Urinalysis w/Reflex to Culture.  Procedure  Abnormality         Status                     ---------                               -----------         ------                     UA,Dipstick[[id number]]                                                                 UA,Microscopic[[id number]]                                                                Please view results for these tests on the individual orders.   UA,DIPSTICK   UA,MICROSCOPIC   PROCALCITONIN       Imaging Results     XR chest ap portable (1 view)   Final Result by Cherlynn Perches, MD (12/28 1257)   IMPRESSION:   Unchanged enlarged cardiomediastinal silhouette from combination of mediastinal adenopathy and left hilar mass extending into upper lobe, and lower lobe with left lymphangitic carcinomatosis. Unchanged moderate left pleural effusion.                Signed by: Cherlynn Perches   03/03/2017 12:57 PM        Progress Notes / Reassessments     ED Course as of Feb 23 1509   Fri 03-03-2017   1246 ECG Interpretation  Time of ECG: 1142  Interpreted by Dr. Elspeth Cho  Rhythm: NSR   Rate: 73  Other: normal axis and intervals. No ST-elevations or depressions.    [AK]   1249 Left arm blood pressure in 90s systolic compared to right which is in the 70s. He is not complaining of chest pain. EF is 70%. Food bolus started. Preparing for pressers if he does not respond.   [AK]   1339 Paged ICU. patient is on BIPAP and cannot be admitted to floor.  [AK]   1443 patient will be admitted MICU.  [AK]      ED Course User Index  [AK] Lovenia Shuck Lenell Antu     ED Procedure Notes     ED BUS- Dr Elspeth Cho    Any ED procedures performed are documented on separate ED procedure notes.    Clinical Impression     1. Acute and chronic respiratory failure with hypoxia (HCC/RAF)    2. Shortness of breath    3. Acute hypoxemic respiratory failure (HCC/RAF)    4. Chronic diastolic heart failure of unknown etiology (HCC/RAF)    5. Chronic obstructive pulmonary disease with acute lower respiratory infection (HCC/RAF)    6. 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    Return precautions are specified on After Visit Summary.    New Prescriptions    No medications on file     Scribe Signature I, Daisy Floro, have acted as a Stage manager for [name] on behalf of Dr. Elspeth Cho on 24-Feb-2017 at 11:32 AM.    Physician Signature     I have reviewed this note as recorded by AK, who acted as medical scribe, and I attest that it is an accurate representation of my H&P and other events of the ED visit except as otherwise noted.               Donzetta Kohut, MD  Resident  02/28/17 1250      ATTENDING NOTE    I have performed a history and physical exam of this patient and discussed the management with the resident. I have reviewed the resident note and agree with the documented findings and plan of care, except as noted in clarifications and additions below.    Ryan Shepard is a 76 y.o. man who presents from clinic for hypoxia. The patient has a h/o COPD for which he was recently admitted December 6-26, 2018.  He is on 3L NC at baseline and presented to clinic with oxygen saturation in the high 70s. The patient reports he has continued cough and yellow sputum.  He has not noticed fever. He does not feel SOB at rest in the ED.  He feels generally weak from his hospital stay and has not recovered completely.   He reports he feels unwell and generally weak.  No complaints of any pain.    Past medical history is also notable for chronic diastolic heart failure, atrial fibrillation (on apixaban) DM, HTN, high cholesterol, PAD (s/p partial amputation of right foot and femoral graft), GERD, liver masses, hypervascular with history of prostate cancer.       The patient was placed on oxygen face mask, goal oxygen saturation 88-90% given concern for CO2 retention.  RT called for bipap and inline nebs.  Patient hypotensive on evaluation and on recheck.  Echo reviewed with EF 70% last month, thus fluid challenge given for sepsis with cultures drawn and antibiotics ordered to cover for hospital acquired infection.  Patient checked and BP right arm is less than BP left arm.  Recent CT scan with no signs of aortic aneurysm and patient without any chest pain.      ED BUS- no signs of right heart failure or strain to suggest PE.      Rx for COPD exacerbation with hypox respiratory failure.    MD Critical Care Excluding Separately Billable Procedures  Time: 38 minutes  Time spent on: examination of patient, development of treatment plan, re-evaluations, ordering, performing treatment, interventions, obtaining history from patient, review of old records, ordering/reviewing labs, radiology, medications, bipap, discussion of treatment plan with patient  Reason: High probability of respiratory and circulatory failure that required my full attention while the patient was critical.          Melchor Amour., MD  02/28/17 1735

## 2017-02-23 NOTE — Consults
Pharmaceutical Services ??? Vancomycin Dosing (Initial)    Patient Name: Ryan Shepard  MRN: [id number]  Age: 76 y.o.  Sex: male    Vancomycin per Pharmacy Consult Order     Start     Ordered    02/22/2017 1441  vancomycin per pharmacy  Per Protocol     Question Answer Comment   Indication Empiric    Goal Trough Serum Level 15-20 mcg/mL    Has patient received a dose of this drug within the past 72 hours? Yes        01/29/2017 1443        Vital Signs/Other Objective Data  Allergies: Patient has no known allergies.    BP 111/55  ~ Pulse 72  ~ Temp 36.9 ???C (98.4 ???F)  ~ Resp 12  ~ Wt 97.1 kg (214 lb)  ~ SpO2 94%  ~ BMI 28.23 kg/m???     Intake/Output Summary (Last 24 hours) at 02/07/2017 1451  Last data filed at 02/16/2017 1215   Gross per 24 hour   Intake              250 ml   Output                0 ml   Net              250 ml       Medications  Med Administrations and Associated Flowsheet Values (last 72 hours)  Vancomycin administration    Date/Time Action Medication Dose Rate    02/22/2017 1300 New Bag/ Syringe/ Cartridge    vancomycin 1,500 mg in sodium chloride 0.9% 500 mL IVPB 1,500 mg 333.33 mL/hr        Labs (most recent)  White Blood Cell Count   Date Value Ref Range Status   02/03/2017 10.72 (H) 4.16 - 9.95 x10E3/uL Final   01/25/2008 7.82 3.28 - 9.29 x10E3/uL      Urea Nitrogen   Date Value Ref Range Status   02/21/2017 19 7 - 22 mg/dL Final   45/40/9811 33 (H) 7 - 23 mg/dL Final     Creatinine   Date Value Ref Range Status   02/07/2017 1.30 0.60 - 1.30 mg/dL Final   91/47/8295 0.9 0.5 - 1.3 mg/dL      Assessment  Indication Empiric  Goal trough level 15-20 mcg/mL  Estimated Creatinine Clearance 60 mL/min  This patient is receiving dialysis: No    Plan  Ryan Shepard is a 76 y.o. male who has been referred to pharmacy for vancomycin dosing. The indication for vancomycin is Empiric. An initial dose of 1,500 mg x1 (15 mg/kg) was administered earlier today in the ED at 1300 pm. Recommend to continue a maintenance regimen of vancomycin 1,250 mg IV q12h. Pharmacy will continue to monitor the patient's clinical progress. The first vancomycin trough is scheduled for 02/25/17 at 1230 pm.      Aileen Fass, PharmD, BCCCP  01/30/2017, 2:51 PM

## 2017-02-24 ENCOUNTER — Ambulatory Visit: Payer: PRIVATE HEALTH INSURANCE

## 2017-02-24 DIAGNOSIS — I5032 Chronic diastolic (congestive) heart failure: Secondary | ICD-10-CM

## 2017-02-24 DIAGNOSIS — J440 Chronic obstructive pulmonary disease with acute lower respiratory infection: Secondary | ICD-10-CM

## 2017-02-24 DIAGNOSIS — J9621 Acute and chronic respiratory failure with hypoxia: Secondary | ICD-10-CM

## 2017-02-24 DIAGNOSIS — B4489 Other forms of aspergillosis: Secondary | ICD-10-CM

## 2017-02-24 DIAGNOSIS — J9601 Acute respiratory failure with hypoxia: Secondary | ICD-10-CM

## 2017-02-24 LAB — UA,Microscopic: RBCS: 0 {cells}/uL (ref 0–11)

## 2017-02-24 LAB — Differential Automated: ABSOLUTE IMMATURE GRAN COUNT: 0.18 10*3/uL — ABNORMAL HIGH (ref 0.00–0.04)

## 2017-02-24 LAB — Blood Gases,venous
PH,VENOUS: 7.35 mmol/L
PH,VENOUS: 7.37
PO2,VENOUS: 28 mmHg
VBG INSPIRED O2: 60 mmol/L

## 2017-02-24 LAB — Bacterial Culture Respiratory

## 2017-02-24 LAB — CBC: PLATELET COUNT, AUTO: 169 10*3/uL (ref 143–398)

## 2017-02-24 LAB — Troponin I
TROPONIN INTERPRETATION: NEGATIVE ng/mL (ref <0.1)
TROPONIN INTERPRETATION: NEGATIVE ng/mL (ref <0.1)

## 2017-02-24 LAB — Blood Gases, arterial
ABG INSPIRED O2: 60 (ref >=95.0)
BICARBONATE: 25.8 mmol/L (ref 22.0–26.0)
BICARBONATE: 26.7 mmol/L — ABNORMAL HIGH (ref 22.0–26.0)
PO2: 69 mmHg — ABNORMAL LOW (ref 98–118)

## 2017-02-24 LAB — Glucose,POC
GLUCOSE,POC: 114 mg/dL — ABNORMAL HIGH (ref 65–99)
GLUCOSE,POC: 154 mg/dL — ABNORMAL HIGH (ref 65–99)
GLUCOSE,POC: 150 mg/dL — ABNORMAL HIGH (ref 65–99)

## 2017-02-24 LAB — Procalcitonin: PROCALCITONIN: 0.69 ug/L — ABNORMAL HIGH (ref <0.10)

## 2017-02-24 LAB — UA,Dipstick: KETONES: NEGATIVE (ref 5.0–8.0)

## 2017-02-24 LAB — Basic Metabolic Panel
POTASSIUM: 4 mmol/L (ref 3.6–5.3)
TOTAL CO2: 25 mmol/L (ref 20–30)

## 2017-02-24 LAB — Magnesium: MAGNESIUM: 1.5 meq/L (ref 1.4–1.9)

## 2017-02-24 LAB — Blood Lactate: BLOOD LACTATE: 21 mg/dL (ref 5–25)

## 2017-02-24 LAB — Respiratory Pathogen Panel PCR: HUMAN METAPNEUMOVIRUS PCR: NOT DETECTED

## 2017-02-24 LAB — Sputum Screen: SPUTUM SCREEN: <10

## 2017-02-24 MED ADMIN — PIPERACILLIN-TAZOBACTAM 3.375 GM/50 ML RTU (EXT 4HR): 3.375 g | INTRAVENOUS | @ 16:00:00 | Stop: 2017-03-02 | NDC 00206886102

## 2017-02-24 MED ADMIN — ALBUTEROL SULFATE (5 MG/ML) 0.5% IN NEBU: 2.5 mg | RESPIRATORY_TRACT | @ 20:00:00 | Stop: 2017-03-07 | NDC 00487990130

## 2017-02-24 MED ADMIN — SODIUM CHLORIDE 0.9 % IV SOLN: 5 mL/h | INTRAVENOUS | @ 02:00:00 | Stop: 2017-02-24

## 2017-02-24 MED ADMIN — ALBUTEROL SULFATE (5 MG/ML) 0.5% IN NEBU: 2.5 mg | RESPIRATORY_TRACT | @ 14:00:00 | Stop: 2017-03-07 | NDC 00487990130

## 2017-02-24 MED ADMIN — MAGNESIUM SULFATE 2 GM/50ML IV SOLN: 2 g | INTRAVENOUS | @ 15:00:00 | Stop: 2017-02-24 | NDC 44567042024

## 2017-02-24 MED ADMIN — FLUTICASONE PROPIONATE HFA 220 MCG/ACT IN AERO: 1 | RESPIRATORY_TRACT | @ 20:00:00 | Stop: 2017-03-02 | NDC 00173072020

## 2017-02-24 MED ADMIN — PIPERACILLIN-TAZOBACTAM 3.375 GM/50 ML RTU (EXT 4HR): 3.375 g | INTRAVENOUS | @ 09:00:00 | Stop: 2017-03-02 | NDC 00206886102

## 2017-02-24 MED ADMIN — ENOXAPARIN SODIUM 40 MG/0.4ML SC SOLN: 40 mg | SUBCUTANEOUS | @ 22:00:00 | Stop: 2017-02-26 | NDC 00703854023

## 2017-02-24 MED ADMIN — LIDOCAINE 5 % EX PTCH: 2 | TRANSDERMAL | @ 07:00:00 | Stop: 2017-02-28

## 2017-02-24 MED ADMIN — IPRATROPIUM BROMIDE 0.02 % IN SOLN: 500 ug | RESPIRATORY_TRACT | @ 08:00:00 | Stop: 2017-03-07 | NDC 00487980101

## 2017-02-24 MED ADMIN — VANCOMYCIN 250 ML IVPB: 1.25 g | INTRAVENOUS | @ 09:00:00 | Stop: 2017-02-26 | NDC 47781059791

## 2017-02-24 MED ADMIN — HEPARIN SODIUM (PORCINE) 5000 UNIT/ML IJ SOLN: 5000 [IU] | SUBCUTANEOUS | @ 05:00:00 | Stop: 2017-02-24 | NDC 25021040201

## 2017-02-24 MED ADMIN — ALBUTEROL SULFATE (5 MG/ML) 0.5% IN NEBU: 2.5 mg | RESPIRATORY_TRACT | @ 08:00:00 | Stop: 2017-03-07 | NDC 00487990130

## 2017-02-24 MED ADMIN — IPRATROPIUM BROMIDE 0.02 % IN SOLN: 500 ug | RESPIRATORY_TRACT | @ 14:00:00 | Stop: 2017-03-07 | NDC 00487980101

## 2017-02-24 MED ADMIN — AZITHROMYCIN IVPB 250 ML: 500 mg | INTRAVENOUS | @ 20:00:00 | Stop: 2017-02-28 | NDC 63323039810

## 2017-02-24 MED ADMIN — FUROSEMIDE 10 MG/ML IJ SOLN: 40 mg | INTRAVENOUS | @ 15:00:00 | Stop: 2017-02-24 | NDC 36000028325

## 2017-02-24 MED ADMIN — VANCOMYCIN 250 ML IVPB: 1.25 g | INTRAVENOUS | @ 22:00:00 | Stop: 2017-02-26 | NDC 47781059791

## 2017-02-24 MED ADMIN — PIPERACILLIN-TAZOBACTAM 3.375 GM/50 ML RTU (EXT 4HR): 3.375 g | INTRAVENOUS | @ 02:00:00 | Stop: 2017-03-02 | NDC 00206886102

## 2017-02-24 MED ADMIN — IPRATROPIUM BROMIDE 0.02 % IN SOLN: 500 ug | RESPIRATORY_TRACT | @ 20:00:00 | Stop: 2017-03-07 | NDC 00487980101

## 2017-02-24 MED ADMIN — FUROSEMIDE 10 MG/ML IJ SOLN: 20 mg | INTRAVENOUS | @ 11:00:00 | Stop: 2017-02-24 | NDC 36000028225

## 2017-02-24 MED ADMIN — SODIUM CHLORIDE 0.9 % IV SOLN: 5 mL/h | INTRAVENOUS | @ 02:00:00 | Stop: 2017-02-24 | NDC 00338961212

## 2017-02-24 NOTE — Consults
INFECTIOUS DISEASES CONSULTATION    Patient: Ryan Shepard  MRN: [id number]  DOB: Nov 29, 1940  Date of Service: 02/24/2017  Requesting Physician: Christiane Ha., MD  Reason for Consultation: R/O Aspergillus Pneumonia    Chief Complaint: Hypoxia    History of Present Illness:   Ryan Shepard is a 76 y/o M with COPD, tobacco use disorder, PAD s/p femoral graft, HTN, DM2, HLD, Afib, GERD, depression, anxiety, chronic pain readmitted on 12/28 following discharge to home on 12/26 with recurrence of dyspnea.     Prior hospital course as follows:  12/6: Admitted with a COPD exacerbation in the setting of diagnosed COPD and extensive tobacco use disorder. CXR on admission showing confluent airspace opacities c/f multifocal aspiration/pneumonia. Procal neg. Treated with levaquin (12/6- ) x 7 days and 5 day course of prednisone.   12/11: Patient was planned for discharge but then was found on bacterial resp culture from admission (12/6) to have aspergillus species, few probably candida. Other micro including legionella urine ag neg, MRSA nares neg, HIV neg. Had CT chest done showing airspace and nodular consolidation within the left upper lobe and lingula and lesser within the right upper and right lower lobes consistent with pneumonia, likely fungal. Also with bulky intrathoracic multicompartamental lymphadenopathy. 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 Started on acyclovir 800 mg BID x 5 days. Gonorrhea/chlamydia neg. RPR pending. Bacterial culture no growth to date. HIV neg.   12/12: VSS, no other acute changes. Requiring 3 L NC. No changes in respiratory status.   12/13: VSS. No acute changes. Pulmonology consulted, recommended bronch,  performed same day. Pt states his SOB has improved drastically. No fevers, chills, nausea, vomiting. No new culture data. 12/14 Events noted; the patient became increasingly hypoxic after the bronchoscopy despite using a 15 liter face mask.  The blood also noted hypercapnia.  According to the notes the patient reported worsening cough associated blood tinged sputum, but today he claims there was no change in his breath or cough.  He was transferred to ICU for further monitoring and started on voriconazole, piperacillin-tazobactam and vancomycin.  He denied fever, chills chest pain or changes in his breathing.  12/15: no acute events. Remains afebrile with normal WBC. Reports he is coughing more today but breathing feels better.   12/16: Transferred to the floor, afebrile  12/23: Completed 14 day course of antibiotic treatment (Vanco/Zosyn/Vori x 3 days; Ceftriaxone/Flagyl x 6 days; Vanco/Zosyn x 5 days)  12/26 Discharged to home  12/28 Seen in Medicine clinic for routine follow-up, noted to have O2Sat in low 70's on 4L NC, readmitted to hospital with BiPap treatment, Vanco/Zosyn/Azithromycin restarted    Pt currently states that he feels well with no change from baseline dyspnea (relieved with O2 treatment), persistent cough with yellow-brown sputum (no hemoptysis) without change from past 2 weeks, no fever/HA/N/V/D, no CP, no abd pain, no other complaints. ID consult requested to evaluate for possible need for antifungal treatment of Aspergillus [place] grown on prior resp cx.      Hospital Course (Key Events): Date of Admission 01/30/2017    Pertinent antimicrobial courses:  As above  Vancomycin (12/28-  Zosyn (12/28-  Azithromycin 12/28-    Review of Systems:  A 14-point review of systems was performed and is negative except for as noted above.     Past Medical History:  Past Medical History:   Diagnosis  Date   ??? Cancer (HCC/RAF)    ??? Diabetes (HCC/RAF)    ??? GERD (gastroesophageal reflux disease)    ??? Hypercholesteremia    ??? Hypertension    ??? PAD (peripheral artery disease) (HCC/RAF) ??? 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 Prostate disease    ??? Scoliosis     adolesent   ??? Vascular disease         Past Surgical History:  Past Surgical History:   Procedure Laterality Date   ??? BACK SURGERY     ??? BILATERAL FEMORAL ARTERY EXPLORATION  2009   ??? BLADDER SURGERY  1999   ??? PROSTATE SURGERY  2000       Allergies:   No Known Allergies    Prior to Admission Medications:  Prescriptions Prior to Admission   Medication Sig Dispense Refill Last Dose   ??? albuterol (2.5 mg/81mL) 0.083% nebulizer solution Inhale contents of 1 ampulr (35mL=2.5 mg total) by nebulization every four (4) hours as needed. 75 mL 3 Taking at Unknown time   ??? albuterol 90 mcg/act inhaler Inhale 2 puffs every six (6) hours as needed. 18 g 0 Taking at Unknown time   ??? allopurinol 100 mg tablet Take 1 tablet (100 mg total) by mouth daily. 30 tablet 0 Taking at Unknown time   ??? apixaban 5 mg tablet Take 1 tablet (5 mg total) by mouth two (2) times daily. 60 tablet 0 Taking at Unknown time   ??? busPIRone 15 mg tablet Take 1 tablet (15 mg total) by mouth two (2) times daily. 60 tablet 0 Taking at Unknown time   ??? carvedilol 25 mg tablet Take 1 tablet (25 mg total) by mouth two (2) times daily. 60 tablet 0 Taking at Unknown time   ??? DULoxetine 60 mg DR capsule Take 1 capsule (60 mg total) by mouth daily. 30 capsule 0 Taking at Unknown time   ??? Fluticasone-Umeclidin-Vilant (TRELEGY ELLIPTA) 100-62.5-25 MCG/INH AEPB Inhale 1 puff daily. 60 each 0 Taking at Unknown time   ??? furosemide 40 mg tablet Take 1 tablet (40 mg total) by mouth two (2) times daily. 60 tablet 0 Taking at Unknown time   ??? gabapentin 400 mg capsule Take 2 capsules (800 mg total) by mouth three (3) times daily. 180 capsule 0 Taking at Unknown time   ??? hydrocodone-acetaminophen (NORCO) 10-325 mg tablet Take 1 tablet by mouth every four (4) to six (6) hours .  0 Taking at Unknown time ??? linagliptin-metFORMIN (JENTADUETO) 2.06-998 MG TABS Take 1 tablet by mouth two (2) times daily. 60 tablet 0 Taking at Unknown time   ??? LORazepam 2 mg tablet Take 2 mg by mouth every four (4) hours as needed.   Taking at Unknown time   ??? losartan 100 mg tablet Take 1 tablet (100 mg total) by mouth daily. 30 tablet 0 Taking at Unknown time   ??? NIFEdipine (ADALAT CC) 30 mg 24 hr tablet Take 1 tablet (30 mg total) by mouth daily. 30 tablet 0 Taking at Unknown time   ??? pantoprazole 40 mg DR tablet Take 1 tablet (40 mg total) by mouth at bedtime. 30 tablet 0 Taking at Unknown time   ??? pravastatin 40 mg tablet Take 1 tablet (40 mg total) by mouth at bedtime. 30 tablet 0 Taking at Unknown time   ??? senna 8.6 mg tablet Take 1 tablet by mouth at bedtime as needed for Constipation. 30 tablet 0 Taking  at Unknown time   ??? Spacer/Aero-Holding Chambers (AEROCHAMBER PLUS FLO-VU) MISC Use as directed for inhalation 1 device 0 Taking at Unknown time       Medications:  Scheduled Meds:  ??? albuterol  2.5 mg Nebulization Q6H   ??? azithromycin  500 mg Intravenous Q24H   ??? enoxaparin  40 mg Subcutaneous Daily   ??? fluticasone  1 puff Inhalation BID   ??? ipratropium  0.5 mg Nebulization Q6H   ??? lidocaine  2 patch Transdermal Q24H   ??? piperacillin/tazobactam  3.375 g Intravenous Q8H   ??? vancomycin  1.25 g Intravenous Q12H     Continuous Infusions:  ??? sodium chloride 5 mL/hr (02/14/2017 1749)     PRN Meds:.acetaminophen    Family History:  Family History   Problem Relation Age of Onset   ??? Cancer Mother    ??? Cancer Father    ??? Malignant hyperthermia Neg Hx      No relevant family history of infectious diseases.    Social History:  Social History     Social History   ??? Marital status: Married     Spouse name: N/A   ??? Number of children: N/A   ??? Years of education: N/A     Social History Main Topics   ??? Smoking status: Never Smoker   ??? Smokeless tobacco: Never Used   ??? Alcohol use 0.0 oz/week   ??? Drug use: No   ??? Sexual activity: Not Asked Other Topics Concern   ??? None     Social History Narrative   ??? None     Lives in [place], originally from [place] (moved to [place] >40 years ago)    Physical Exam:  Temp:  [36 ???C (96.8 ???F)-36.8 ???C (98.2 ???F)] 36 ???C (96.8 ???F)  Heart Rate:  [69-95] 84  Resp:  [10-22] 19  BP: (98-134)/(53-77) 120/66  NBP Mean:  [69-90] 82  FiO2 (%):  [50 %-60 %] 60 %  SpO2:  [85 %-96 %] 95 %  Temp (24hrs), Avg:36.4 ???C (97.6 ???F), Min:36 ???C (96.8 ???F), Max:36.8 ???C (98.2 ???F)    Intake and Output:   Last Two Completed Shifts:  I/O last 2 completed shifts:  In: 1691.3 [I.V.:131.3; IV Piggyback:1560]  Out: 1950 [Urine:1950]  Vitals:    02/24/17 0600   Weight: (!) 105.2 kg (232 lb)       General: no acute distress, lying in bed on supplemental O2, speaking in complete sentences without difficulty  Eyes: anicteric sclerae, no injection, extraocular movements are intact  ENT: no oropharyngeal lesions, mmm, normal dentition  Neck: supple, no masses  Lymph: no cervical, supraclavicular, or axillary adenopathy   CV: Regular rate and rhythm. Normal S1 and S2. No murmurs, rubs, or gallops. 2+ distal pulses.   Pulm: Normal effort. Diffuse rhonchi and crackles B/L, R>L  GI: Soft, nondistended, and nontender. Normoactive bowel sounds. No guarding or rebound.  Musculoskeletal: No clubbing, cyanosis, or edema.   Skin: no visible rash, normal turgor  Neuro: Awake and alert, moving all 4 extremities. Grossly nonfocal exam.   Psych: Appropriate mood and affect. Normal judgment and insight.    Labs:  Lab Results   Component Value Date    WBC 7.74 02/24/2017    HGB 10.8 (L) 02/24/2017    HCT 33.2 (L) 02/24/2017    MCV 87.4 02/24/2017    PLT 169 02/24/2017     Lab Results   Component Value Date    CREAT 0.92 02/24/2017  BUN 22 02/24/2017    NA 137 02/24/2017    K 4.0 02/24/2017    CL 97 02/24/2017    CO2 25 02/24/2017     Lab Results   Component Value Date    ALT 18 02/08/2017    AST 20 02/08/2017    ALKPHOS 50 02/08/2017    BILITOT 0.4 02/08/2017 Microbiology:   12/28 Resp Cx NTD  12/28 Blood Cx NTD  12/28 RVP Neg  12/18 Resp Cx Neg (C albicans)  12/13 BAL Cx Neg  12/13 respiratory culture: candida, therwise negative so far; AFB smear negative x 2 (AFB cultures NTD)  12/13 legionella culture neg  Aspergillus Ag EIA -  <0.50  RVP panel negative  Aspergillus fumigatus (M3) IgE - NEG   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  HIV  - neg  Legionella Urinary Ag - neg  Chlamydia/gonorrhea PCR - neg  RPR nonreactive    Imaging Reviewed by Me:   12/28 CXR  Unchanged enlarged cardiomediastinal silhouette from combination of mediastinal adenopathy and left hilar mass extending into upper lobe, and lower lobe with left lymphangitic carcinomatosis. Unchanged moderate left pleural effusion.    12/20 CT Chest  1.   Redemonstration of coalescent airspace and nodular consolidation, most prominent in the left upper lobe and lingula with peripheral groundglass associated with Unchanged to slightly more prominent mediastinal and bilateral hilar lymphadenopathy.   This finding is nonspecific but concerning for malignancy versus pneumonia with etiologies including fungal infection.   2.  Increase in size of pleural and pericardial effusions and anasarca consistent with volume overload.  3.   Dilated main pulmonary artery measuring 39 mm reflecting pulmonary arterial hypertension.  4.  Severe coronary artery calcifications.    12/20 CT A/P  ???  History of prostate cancer treated with total prostatectomy with at least 4 hypervascular hepatic masses measuring up to 5 cm in the right hepatic lobe, suspicious for metastatic disease. No abdominal or pelvic lymphadenopathy.  Severe atherosclerotic disease with thrombosed left common, external, and femoral artery, and patent femorofemoral bypass.    Assessment:   76 yo M with history of COPD, Prostate CA with possible metastatic disease, recent admission for dyspnea treated with 14 days antibiotics, now readmitted with hypoxia but pt comfortable on exam. Hypoxia on supplemental O2, diffuse nodular opacities in lungs, continue production of purulent sputum. Agree with empiric antibiotic treatment while awaiting results of cultures. Although concern for possible aspergillus pneumonia is reasonable in this patient with persistent lung disease and imaging consistent with fungal infection, only A [place] (not a common cause of pulmonary disease) has been isolated on a single expectorated sputum sample, with no growth on multiple other respiratory cultures, including a BAL culture, and negative galactomannan assay (while on zosyn). Recurrent hypoxia likely secondary to a persistent or recurrent bacterial infection secondary to obstructive pulmonary process (COPD +/- lung metastases).      Problem List:   1) COPD  2) Prostate Cancer with possible metastatic disease  3) Hypoxia  4) Bacterial pneumonia (unspecified)    - Precautions: No infectious isolation    Recommendations/Plan:   1) Continue Vancomycin/Zosyn/Azithromycin  2) Follow up resp cx (including AFB cx from 12/13)  3) Send urine histoplasma Ag, hold antifungal treatment at  this time  4) Bipap and supplemental oxygenation per ICU team    Thank you for this consultation. We will continue to follow with you. Please page [phone] (General ID) with any questions.    Recommendations communicated to ICU team.    Author:  Riccardo Dubin. Chestine Spore, MD 02/24/2017 12:50 PM

## 2017-02-24 NOTE — Other
Patients Clinical Goal:   Clinical Goal(s) for the Shift: 1. Maintain pt safety and comfort 2. Hemodynamic stability: map>65, NSR 3. Oxygenation: 02 sat>88%, absence of dyspnea 4. admit to MICU  Identify possible barriers to advancing the care plan: infection  Stability of the patient: Moderately Unstable - medium risk of patient condition declining or worsening    End of Shift Summary: 1. No safety issues. Uses call light for needs. C/o 8/10 chronic pain in back, neck and legs but is asleep if undisturbed. No pain medications ordered. Pending confirmation of home meds when spouse arrives per MD. 2. Map>65. NSR with PVCs per monitor 3. 02 sats maintained >88% with HFNC at 60%/60L. No c/o dyspnea. 4. Admission in progress: pt wishes to discuss vaccines with spouse, wallet at [facility] to go home with spouse, page MD when spouse arrives to confirm home meds.   Urine sample pending.

## 2017-02-24 NOTE — Nursing Note
1900- report received from [place], [place]- pending urine sample. Asked pt to notify nurse when needing to urinate in order to collect sample.  -urine sample sent  0230- MD paged for report of pressure on chest. Patient expressed difficulty breathing while SpO2 sat remained 92%. Troponin ordered, ECG, ABG drawn and sent. Pt transitioned to BiPAP. Patient also reported improvement in chest pain pressure. 20mg  Lasix IVP x1 ordered.  8938- Lasix 40mg  given per MD order.  0700- report given to [place], [place]

## 2017-02-24 NOTE — Other
Patients Clinical Goal:   Clinical Goal(s) for the Shift: 1. Monitor hemodynamics, MAP >65 2. Monitor respiratory status, SpO2 >88% 3, CHG 4. Promote patient comfort and safety  Identify possible barriers to advancing the care plan: respiratory status, infection  Stability of the patient: Moderately Unstable - medium risk of patient condition declining or worsening    End of Shift Summary:   1. MAP >65. Afebrile. PVCs noted occasionally. ECG completed when patient complained of shortness of breath/chest tightness. Troponin sent, but was negative. Pt placed on bipap and expressed relief. ABGs sent as well. Patient expressed better ability to breathe.  2. SpO2 >88%. Pt was received on HFNC 60% 60L, but was transitioned to BiPAP around 0300 due to increased work of breathing. MD discussing the possibility of intubation, but no further details at the moment. CXR completed  3. CHG completed.  4. Patient comfort and safety maintained. Patient has call light and is able to use it when needed. He is able to call and expresses needs adequately. Wife at bedside.      Ethics Protocol   Ethics Risk Conflict Score: MEDIUM risk for ethical conflict - has a moderate possibility of developing in these conditions (4-6 risk factors checked)   Reason/Rationale (Why?): no AD on file

## 2017-02-25 ENCOUNTER — Ambulatory Visit: Payer: MEDICARE

## 2017-02-25 ENCOUNTER — Ambulatory Visit: Payer: PRIVATE HEALTH INSURANCE

## 2017-02-25 LAB — Blood Gases,venous
O2 SATURATION/MEASURED: 60.2
PCO2,VENOUS: 50 mmHg
PH,VENOUS: 7.39

## 2017-02-25 LAB — Basic Metabolic Panel
CALCIUM: 8.7 mg/dL (ref 8.6–10.4)
CALCIUM: 8.7 mg/dL (ref 8.6–10.4)
GFR ESTIMATE FOR AFRICAN AMERICAN: >89 mg/dL (ref 0.60–1.30)
POTASSIUM: 4 mmol/L (ref 3.6–5.3)
POTASSIUM: 4 mmol/L (ref 3.6–5.3)
UREA NITROGEN: 13 mg/dL (ref 7–22)

## 2017-02-25 LAB — Glucose,POC
GLUCOSE,POC: 105 mg/dL — ABNORMAL HIGH (ref 65–99)
GLUCOSE,POC: 152 mg/dL — ABNORMAL HIGH (ref 65–99)
GLUCOSE,POC: 153 mg/dL — ABNORMAL HIGH (ref 65–99)

## 2017-02-25 LAB — B-Type Natriuretic Peptide: BNP: 46 pg/mL (ref <100)

## 2017-02-25 LAB — MRSA Surveillance

## 2017-02-25 LAB — Magnesium
MAGNESIUM: 1.6 meq/L (ref 1.4–1.9)
MAGNESIUM: 1.8 meq/L (ref 1.4–1.9)
MAGNESIUM: 1.8 meq/L (ref 1.4–1.9)

## 2017-02-25 LAB — CBC: ABSOLUTE NUCLEATED RBC COUNT: 0 10*3/uL (ref 0.00–0.00)

## 2017-02-25 LAB — Vancomycin,trough: VANCOMYCIN,TROUGH: 17.3 ug/mL (ref 10.0–20.0)

## 2017-02-25 LAB — Bacterial Culture Respiratory

## 2017-02-25 MED ADMIN — PIPERACILLIN-TAZOBACTAM 3.375 GM/50 ML RTU (EXT 4HR): 3.375 g | INTRAVENOUS | Stop: 2017-03-02 | NDC 00206886102

## 2017-02-25 MED ADMIN — VANCOMYCIN 250 ML IVPB: 1.25 g | INTRAVENOUS | @ 10:00:00 | Stop: 2017-02-26 | NDC 47781059791

## 2017-02-25 MED ADMIN — OXYCODONE HCL 5 MG PO TABS: 10 mg | ORAL | @ 06:00:00 | Stop: 2017-03-06 | NDC 00406055262

## 2017-02-25 MED ADMIN — PIPERACILLIN-TAZOBACTAM 3.375 GM/50 ML RTU (EXT 4HR): 3.375 g | INTRAVENOUS | @ 08:00:00 | Stop: 2017-03-02 | NDC 00206886102

## 2017-02-25 MED ADMIN — FUROSEMIDE 10 MG/ML IJ SOLN: 40 mg | INTRAVENOUS | @ 21:00:00 | Stop: 2017-02-25 | NDC 36000028325

## 2017-02-25 MED ADMIN — GABAPENTIN 400 MG PO CAPS: 800 mg | ORAL | @ 04:00:00 | Stop: 2017-02-28 | NDC 68084077401

## 2017-02-25 MED ADMIN — DULOXETINE HCL 60 MG PO CPEP: 60 mg | ORAL | @ 16:00:00 | Stop: 2017-02-28 | NDC 00904645461

## 2017-02-25 MED ADMIN — BUSPIRONE HCL 5 MG PO TABS: 15 mg | ORAL | @ 16:00:00 | Stop: 2017-02-28 | NDC 16729020001

## 2017-02-25 MED ADMIN — FLUTICASONE PROPIONATE HFA 220 MCG/ACT IN AERO: 1 | RESPIRATORY_TRACT | @ 07:00:00 | Stop: 2017-03-02 | NDC 00173072020

## 2017-02-25 MED ADMIN — GABAPENTIN 400 MG PO CAPS: 800 mg | ORAL | @ 20:00:00 | Stop: 2017-02-28 | NDC 68084077401

## 2017-02-25 MED ADMIN — GABAPENTIN 400 MG PO CAPS: 800 mg | ORAL | @ 13:00:00 | Stop: 2017-02-28 | NDC 68084077401

## 2017-02-25 MED ADMIN — MAGNESIUM SULFATE 2 GM/50ML IV SOLN: 2 g | INTRAVENOUS | @ 02:00:00 | Stop: 2017-02-25 | NDC 44567042024

## 2017-02-25 MED ADMIN — FLUTICASONE PROPIONATE HFA 220 MCG/ACT IN AERO: 1 | RESPIRATORY_TRACT | @ 20:00:00 | Stop: 2017-03-02 | NDC 00173072020

## 2017-02-25 MED ADMIN — VANCOMYCIN 250 ML IVPB: 1.25 g | INTRAVENOUS | @ 21:00:00 | Stop: 2017-02-26 | NDC 47781059791

## 2017-02-25 MED ADMIN — OXYCODONE HCL 5 MG PO TABS: 10 mg | ORAL | Stop: 2017-03-06 | NDC 00406055262

## 2017-02-25 MED ADMIN — IPRATROPIUM BROMIDE 0.02 % IN SOLN: 500 ug | RESPIRATORY_TRACT | @ 01:00:00 | Stop: 2017-03-07 | NDC 00487980101

## 2017-02-25 MED ADMIN — PIPERACILLIN-TAZOBACTAM 3.375 GM/50 ML RTU (EXT 4HR): 3.375 g | INTRAVENOUS | @ 16:00:00 | Stop: 2017-03-02 | NDC 00206886102

## 2017-02-25 MED ADMIN — ENOXAPARIN SODIUM 40 MG/0.4ML SC SOLN: 40 mg | SUBCUTANEOUS | @ 16:00:00 | Stop: 2017-02-26 | NDC 00703854023

## 2017-02-25 MED ADMIN — AZITHROMYCIN IVPB 250 ML: 500 mg | INTRAVENOUS | @ 19:00:00 | Stop: 2017-02-28 | NDC 63323039810

## 2017-02-25 MED ADMIN — LIDOCAINE 5 % EX PTCH: 2 | TRANSDERMAL | @ 06:00:00 | Stop: 2017-02-28 | NDC 00591352530

## 2017-02-25 MED ADMIN — IPRATROPIUM BROMIDE 0.02 % IN SOLN: 500 ug | RESPIRATORY_TRACT | @ 14:00:00 | Stop: 2017-03-07 | NDC 00487980101

## 2017-02-25 MED ADMIN — POTASSIUM CHLORIDE CRYS ER 20 MEQ PO TBCR: 20 meq | ORAL | @ 13:00:00 | Stop: 2017-02-25 | NDC 66758019013

## 2017-02-25 MED ADMIN — OXYCODONE HCL 5 MG PO TABS: 10 mg | ORAL | @ 13:00:00 | Stop: 2017-03-06 | NDC 00406055262

## 2017-02-25 MED ADMIN — PRAVASTATIN SODIUM 20 MG PO TABS: 40 mg | ORAL | @ 04:00:00 | Stop: 2017-02-28 | NDC 51079045820

## 2017-02-25 MED ADMIN — LIDOCAINE 5 % EX PTCH: 2 | TRANSDERMAL | @ 21:00:00 | Stop: 2017-02-28

## 2017-02-25 MED ADMIN — ALBUTEROL SULFATE (5 MG/ML) 0.5% IN NEBU: 2.5 mg | RESPIRATORY_TRACT | @ 01:00:00 | Stop: 2017-03-07 | NDC 00487990130

## 2017-02-25 MED ADMIN — IPRATROPIUM BROMIDE 0.02 % IN SOLN: 500 ug | RESPIRATORY_TRACT | @ 07:00:00 | Stop: 2017-03-07 | NDC 00487980101

## 2017-02-25 MED ADMIN — ALBUTEROL SULFATE (5 MG/ML) 0.5% IN NEBU: 2.5 mg | RESPIRATORY_TRACT | @ 20:00:00 | Stop: 2017-03-07 | NDC 00487990130

## 2017-02-25 MED ADMIN — MAGNESIUM SULFATE 2 GM/50ML IV SOLN: 2 g | INTRAVENOUS | @ 13:00:00 | Stop: 2017-02-25 | NDC 44567042024

## 2017-02-25 MED ADMIN — OXYCODONE HCL 5 MG PO TABS: 10 mg | ORAL | @ 20:00:00 | Stop: 2017-03-06 | NDC 00406055262

## 2017-02-25 MED ADMIN — ALBUTEROL SULFATE (5 MG/ML) 0.5% IN NEBU: 2.5 mg | RESPIRATORY_TRACT | @ 14:00:00 | Stop: 2017-03-07 | NDC 00487990130

## 2017-02-25 MED ADMIN — ALBUTEROL SULFATE (5 MG/ML) 0.5% IN NEBU: 2.5 mg | RESPIRATORY_TRACT | @ 07:00:00 | Stop: 2017-03-07 | NDC 00487990130

## 2017-02-25 MED ADMIN — FUROSEMIDE 10 MG/ML IJ SOLN: 20 mg | INTRAVENOUS | @ 17:00:00 | Stop: 2017-02-25 | NDC 36000028225

## 2017-02-25 MED ADMIN — IPRATROPIUM BROMIDE 0.02 % IN SOLN: 500 ug | RESPIRATORY_TRACT | @ 20:00:00 | Stop: 2017-03-07 | NDC 00487980101

## 2017-02-25 MED ADMIN — BUSPIRONE HCL 5 MG PO TABS: 15 mg | ORAL | @ 04:00:00 | Stop: 2017-02-28 | NDC 16729020001

## 2017-02-25 NOTE — Progress Notes
INFECTIOUS DISEASES CONSULTATION    Patient: Ryan Shepard  MRN: [id number]  DOB: 1940/11/07  Date of Service: 02/25/2017  Requesting Physician: Christiane Ha., MD  Reason for Consultation: R/O Aspergillus Pneumonia    Chief Complaint: Hypoxia    History of Present Illness:   Mr Seda is a 76 y/o M with COPD, tobacco use disorder, PAD s/p femoral graft, HTN, DM2, HLD, Afib, GERD, depression, anxiety, chronic pain readmitted on 12/28 following discharge to home on 12/26 with recurrence of dyspnea.     Prior hospital course as follows:  12/6: Admitted with a COPD exacerbation in the setting of diagnosed COPD and extensive tobacco use disorder. CXR on admission showing confluent airspace opacities c/f multifocal aspiration/pneumonia. Procal neg. Treated with levaquin (12/6- ) x 7 days and 5 day course of prednisone.   12/11: Patient was planned for discharge but then was found on bacterial resp culture from admission (12/6) to have aspergillus species, few probably candida. Other micro including legionella urine ag neg, MRSA nares neg, HIV neg. Had CT chest done showing airspace and nodular consolidation within the left upper lobe and lingula and lesser within the right upper and right lower lobes consistent with pneumonia, likely fungal. Also with bulky intrathoracic multicompartamental lymphadenopathy. 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 Started on acyclovir 800 mg BID x 5 days. Gonorrhea/chlamydia neg. RPR pending. Bacterial culture no growth to date. HIV neg.   12/12: VSS, no other acute changes. Requiring 3 L NC. No changes in respiratory status.   12/13: VSS. No acute changes. Pulmonology consulted, recommended bronch,  performed same day. Pt states his SOB has improved drastically. No fevers, chills, nausea, vomiting. No new culture data. 12/14 Events noted; the patient became increasingly hypoxic after the bronchoscopy despite using a 15 liter face mask.  The blood also noted hypercapnia.  According to the notes the patient reported worsening cough associated blood tinged sputum, but today he claims there was no change in his breath or cough.  He was transferred to ICU for further monitoring and started on voriconazole, piperacillin-tazobactam and vancomycin.  He denied fever, chills chest pain or changes in his breathing.  12/15: no acute events. Remains afebrile with normal WBC. Reports he is coughing more today but breathing feels better.   12/16: Transferred to the floor, afebrile  12/23: Completed 14 day course of antibiotic treatment (Vanco/Zosyn/Vori x 3 days; Ceftriaxone/Flagyl x 6 days; Vanco/Zosyn x 5 days)  12/26 Discharged to home  12/28 Seen in Medicine clinic for routine follow-up, noted to have O2Sat in low 70's on 4L NC, readmitted to hospital with BiPap treatment, Vanco/Zosyn/Azithromycin restarted    Interval Events:  12/30: No acute events overnight, no change in respiratory status, continued productive cough, no growth to date on cultures from this admission      Hospital Course (Key Events): Date of Admission 02/15/2017    Pertinent antimicrobial courses:  As above  Vancomycin (12/28-  Zosyn (12/28-  Azithromycin 12/28-    Review of Systems:  A 14-point review of systems was performed and is negative except for as noted above.     Past Medical History:  Past Medical History:   Diagnosis Date   ??? Cancer (HCC/RAF)    ??? Diabetes (HCC/RAF)    ??? GERD (gastroesophageal reflux disease)    ??? Hypercholesteremia    ??? Hypertension    ???  PAD (peripheral artery disease) (HCC/RAF)    ??? 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 Prostate disease    ??? Scoliosis     adolesent   ??? Vascular disease         Past Surgical History:  Past Surgical History:   Procedure Laterality Date   ??? BACK SURGERY ??? BILATERAL FEMORAL ARTERY EXPLORATION  2009   ??? BLADDER SURGERY  1999   ??? PROSTATE SURGERY  2000       Allergies:   No Known Allergies    Prior to Admission Medications:  Prescriptions Prior to Admission   Medication Sig Dispense Refill Last Dose   ??? albuterol (2.5 mg/21mL) 0.083% nebulizer solution Inhale contents of 1 ampulr (21mL=2.5 mg total) by nebulization every four (4) hours as needed. 75 mL 3 Taking at Unknown time   ??? albuterol 90 mcg/act inhaler Inhale 2 puffs every six (6) hours as needed. 18 g 0 Taking at Unknown time   ??? allopurinol 100 mg tablet Take 1 tablet (100 mg total) by mouth daily. 30 tablet 0 Taking at Unknown time   ??? apixaban 5 mg tablet Take 1 tablet (5 mg total) by mouth two (2) times daily. 60 tablet 0 Taking at Unknown time   ??? busPIRone 15 mg tablet Take 1 tablet (15 mg total) by mouth two (2) times daily. 60 tablet 0 Taking at Unknown time   ??? carvedilol 25 mg tablet Take 1 tablet (25 mg total) by mouth two (2) times daily. 60 tablet 0 Taking at Unknown time   ??? DULoxetine 60 mg DR capsule Take 1 capsule (60 mg total) by mouth daily. 30 capsule 0 Taking at Unknown time   ??? Fluticasone-Umeclidin-Vilant (TRELEGY ELLIPTA) 100-62.5-25 MCG/INH AEPB Inhale 1 puff daily. 60 each 0 Taking at Unknown time   ??? furosemide 40 mg tablet Take 1 tablet (40 mg total) by mouth two (2) times daily. 60 tablet 0 Taking at Unknown time   ??? gabapentin 400 mg capsule Take 2 capsules (800 mg total) by mouth three (3) times daily. 180 capsule 0 Taking at Unknown time   ??? hydrocodone-acetaminophen (NORCO) 10-325 mg tablet Take 1 tablet by mouth every four (4) to six (6) hours .  0 Taking at Unknown time   ??? linagliptin-metFORMIN (JENTADUETO) 2.06-998 MG TABS Take 1 tablet by mouth two (2) times daily. 60 tablet 0 Taking at Unknown time   ??? LORazepam 2 mg tablet Take 2 mg by mouth every four (4) hours as needed.   Taking at Unknown time   ??? losartan 100 mg tablet Take 1 tablet (100 mg total) by mouth daily. 30 tablet 0 Taking at Unknown time   ??? NIFEdipine (ADALAT CC) 30 mg 24 hr tablet Take 1 tablet (30 mg total) by mouth daily. 30 tablet 0 Taking at Unknown time   ??? pantoprazole 40 mg DR tablet Take 1 tablet (40 mg total) by mouth at bedtime. 30 tablet 0 Taking at Unknown time   ??? pravastatin 40 mg tablet Take 1 tablet (40 mg total) by mouth at bedtime. 30 tablet 0 Taking at Unknown time   ??? senna 8.6 mg tablet Take 1 tablet by mouth at bedtime as needed for Constipation. 30 tablet 0 Taking at Unknown time   ??? Spacer/Aero-Holding Chambers (AEROCHAMBER PLUS FLO-VU) MISC Use as directed for inhalation 1 device 0 Taking at Unknown time       Medications:  Scheduled  Meds:  ??? albuterol  2.5 mg Nebulization Q6H   ??? azithromycin  500 mg Intravenous Q24H   ??? busPIRone  15 mg Oral BID   ??? DULoxetine  60 mg Oral Daily   ??? enoxaparin  40 mg Subcutaneous Daily   ??? fluticasone  1 puff Inhalation BID   ??? gabapentin  800 mg Oral TID   ??? ipratropium  0.5 mg Nebulization Q6H   ??? lidocaine  2 patch Transdermal Q24H   ??? piperacillin/tazobactam  3.375 g Intravenous Q8H   ??? pravastatin  40 mg Oral QHS   ??? vancomycin  1.25 g Intravenous Q12H     Continuous Infusions:    PRN Meds:.acetaminophen, oxyCODONE, senna    Family History:  Family History   Problem Relation Age of Onset   ??? Cancer Mother    ??? Cancer Father    ??? Malignant hyperthermia Neg Hx      No relevant family history of infectious diseases.    Social History:  Social History     Social History   ??? Marital status: Married     Spouse name: N/A   ??? Number of children: N/A   ??? Years of education: N/A     Social History Main Topics   ??? Smoking status: Never Smoker   ??? Smokeless tobacco: Never Used   ??? Alcohol use 0.0 oz/week   ??? Drug use: No   ??? Sexual activity: Not Asked     Other Topics Concern   ??? None     Social History Narrative   ??? None     Lives in [place], originally from [place] (moved to [place] >40 years ago)    Physical Exam: Temp:  [36.3 ???C (97.3 ???F)-36.8 ???C (98.2 ???F)] 36.8 ???C (98.2 ???F)  Heart Rate:  [80-118] 102  Resp:  [13-21] 13  BP: (96-142)/(62-92) 118/75  NBP Mean:  [74-103] 90  FiO2 (%):  [60 %-65 %] 60 %  SpO2:  [89 %-94 %] 90 %  Temp (24hrs), Avg:36.6 ???C (97.8 ???F), Min:36.3 ???C (97.3 ???F), Max:36.8 ???C (98.2 ???F)    Intake and Output:   Last Two Completed Shifts:  I/O last 2 completed shifts:  In: 1380 [P.O.:630; I.V.:150; IV Piggyback:600]  Out: 2650 [Urine:2650]  Vitals:    02/25/17 0345   Weight: (!) 103.1 kg (227 lb 4.7 oz)       General: no acute distress, lying in bed on supplemental O2, speaking in complete sentences without difficulty  Eyes: anicteric sclerae, no injection, extraocular movements are intact  ENT: no oropharyngeal lesions, mmm, normal dentition  Neck: supple, no masses  Lymph: no cervical, supraclavicular, or axillary adenopathy   CV: Regular rate and rhythm. Normal S1 and S2. No murmurs, rubs, or gallops. 2+ distal pulses.   Pulm: Normal effort. Diffuse rhonchi and crackles B/L, R>L  GI: Soft, nondistended, and nontender. Normoactive bowel sounds. No guarding or rebound.  Musculoskeletal: No clubbing, cyanosis, or edema.   Skin: no visible rash, normal turgor  Neuro: Awake and alert, moving all 4 extremities. Grossly nonfocal exam.   Psych: Appropriate mood and affect. Normal judgment and insight.    Labs:  Lab Results   Component Value Date    WBC 7.74 02/24/2017    HGB 10.8 (L) 02/24/2017    HCT 33.2 (L) 02/24/2017    MCV 87.4 02/24/2017    PLT 169 02/24/2017     Lab Results   Component Value Date    CREAT  0.88 02/25/2017    BUN 13 02/25/2017    NA 139 02/25/2017    K 3.8 02/25/2017    CL 96 02/25/2017    CO2 27 02/25/2017     Lab Results   Component Value Date    ALT 18 02/08/2017    AST 20 02/08/2017    ALKPHOS 50 02/08/2017    BILITOT 0.4 02/08/2017       Microbiology:   12/28 Resp Cx NTD  12/28 Blood Cx NTD  12/28 RVP Neg  12/18 Resp Cx Neg (C albicans)  12/13 BAL Cx Neg 12/13 respiratory culture: candida, therwise negative so far; AFB smear negative x 2 (AFB cultures NTD)  12/13 legionella culture neg  Aspergillus Ag EIA -  <0.50  RVP panel negative  Aspergillus fumigatus (M3) IgE - NEG   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  HIV  - neg  Legionella Urinary Ag - neg  Chlamydia/gonorrhea PCR - neg  RPR nonreactive    Imaging Reviewed by Me:   12/28 CXR  Unchanged enlarged cardiomediastinal silhouette from combination of mediastinal adenopathy and left hilar mass extending into upper lobe, and lower lobe with left lymphangitic carcinomatosis. Unchanged moderate left pleural effusion.    12/20 CT Chest  1.   Redemonstration of coalescent airspace and nodular consolidation, most prominent in the left upper lobe and lingula with peripheral groundglass associated with Unchanged to slightly more prominent mediastinal and bilateral hilar lymphadenopathy.   This finding is nonspecific but concerning for malignancy versus pneumonia with etiologies including fungal infection.   2.  Increase in size of pleural and pericardial effusions and anasarca consistent with volume overload.  3.   Dilated main pulmonary artery measuring 39 mm reflecting pulmonary arterial hypertension.  4.  Severe coronary artery calcifications.    12/20 CT A/P  ???  History of prostate cancer treated with total prostatectomy with at least 4 hypervascular hepatic masses measuring up to 5 cm in the right hepatic lobe, suspicious for metastatic disease. No abdominal or pelvic lymphadenopathy.  Severe atherosclerotic disease with thrombosed left common, external, and femoral artery, and patent femorofemoral bypass.    Assessment:   76 yo M with history of COPD, Prostate CA with possible metastatic disease, recent admission for dyspnea treated with 14 days antibiotics, now readmitted with hypoxia but pt comfortable on exam. Hypoxia on supplemental O2, diffuse nodular opacities in lungs, continue production of purulent sputum. Agree with empiric antibiotic treatment while awaiting results of cultures. Although concern for possible aspergillus pneumonia is reasonable in this patient with persistent lung disease and imaging consistent with fungal infection, only A [place] (not a common cause of pulmonary disease) has been isolated on a single expectorated sputum sample, with no growth on multiple other respiratory cultures, including a BAL culture, and negative galactomannan assay (while on zosyn). Recurrent hypoxia likely secondary to a persistent or recurrent bacterial infection secondary to obstructive pulmonary process (COPD +/- lung metastases).      Problem List:   1) COPD  2) Prostate Cancer with possible metastatic disease  3) Hypoxia  4) Bacterial pneumonia (unspecified)    - Precautions: No infectious isolation    Recommendations/Plan:   1) Continue Vancomycin/Zosyn/Azithromycin  2) Follow up resp cx (including AFB cx from 12/13)  3) Follow up urine histoplasma Ag, continue to hold antifungal treatment  4) Bipap and supplemental oxygenation per ICU team    Thank you for this consultation. We will continue to follow with you. Please page [id number] (General ID) with any questions.    Recommendations communicated to ICU team.    Author:  Riccardo Dubin. Chestine Spore, MD 02/25/2017 1:56 PM

## 2017-02-25 NOTE — H&P
MICU History and Physical     PMD: Sheppard Coil, MD  DATE OF SERVICE: 02/24/2017  HOSPITAL DAY: 1  CHIEF COMPLAINT: Shortness of Breath (at clinic sating 77% on 4L +Cough yellow sputum +wheezing )    History of Present Illness   Ryan Shepard is a 76 y.o. 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    Patient was recently hospitalized and discharged 12/26 for similar issue requiring aggressive bronchodilation and bronchoscopy and was also treated for COPD exacerbation.    Patient was admitted to ED this AM directly from clinic d/t incr respiratory distress and sats in low 70's on home 4L required. On admission to ED vitals were notable for sats in low 80s on home O2 requirement. Patient's BP was noted to be lower in R arm (SBP 90) compared to left arm (SBPs 110-120s), however patient has no CP. CXR was stable from prior and showed unchanged mediastinal adenopathy and left hilar mass extending into upper lobe and lower lobe with left lymphangitic carcinomatosis, unchanged moderate left pleural effusion. Patient was placed on BiPAP titrated up to FiO2 60%, 12/5 and ABG was 7.4/45/72/27. Patient was given duonebs x 1, empiric doses of vanc and zosyn, 20mg  IV lasix and transferred to ICU for further management.    In ICU, patient was weaned to HFNC 60/60 from BiPAP.    On further history, patient reports feeling mildly SOB but otherwise feels well. Reports increased sputum x 2 months occasionally scant amounts of brown, worsening over past month. Patient states that he felt back to his baseline when discharged home from hospital on 12/26 (home O2 requirement of 4L). He went to clinic 12/28 AM for routine follow-up where he was noted to have desats but he reports feeling ''fine.'' To his knowledge has taken all of his home medications including lasix, however he was only home for 1 day from discharge and his wife helps him with medications. Former smoker and but has not smoked in over 25 years.  Does endorse mild right lower leg pain but denies swelling. No h/o clots, is on [facility] for afib. Denies chest pain, F/chills, weight loss/gain, N/V, abd pain, recent travel, orthopnea, hemoptysis.    Interval Events:  -Put back on BiPAP since 4am from HFNC ABG 7.4/43/65, bicarb 25.  -On BiPAP 60%/70, 10/5 (set RR 8 but breathing 13-18). AM ABG 7.42/42/69, bicarb 26.5 on   -AM CXR more fluid overload, given lasix  -Afebrile (tmax 36.9), HR 80s, BP Maps 70-80 with SBP 100/60s, sats 88-92%  -I/O: net negative 300    Subjective:  -Reports increased difficulty with breathing, improving with lasix. Denies CP, F/chills, N/V, dizziness, orthopnea, dysuria.    Past Medical History     Past Medical History:   Diagnosis Date   ??? Cancer (HCC/RAF)    ??? Diabetes (HCC/RAF)    ??? GERD (gastroesophageal reflux disease)    ??? Hypercholesteremia    ??? Hypertension    ??? PAD (peripheral artery disease) (HCC/RAF)    ??? 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 Prostate disease    ??? Scoliosis     adolesent   ??? Vascular disease        Past Surgical History     Past Surgical History:   Procedure Laterality Date   ??? BACK SURGERY     ???  BILATERAL FEMORAL ARTERY EXPLORATION  2009   ??? BLADDER SURGERY  1999   ??? PROSTATE SURGERY  2000       Family History     Family History   Problem Relation Age of Onset   ??? Cancer Mother    ??? Cancer Father    ??? Malignant hyperthermia Neg Hx        Social History     Social History     Social History   ??? Marital status: Married     Spouse name: N/A   ??? Number of children: N/A   ??? Years of education: N/A     Social History Main Topics   ??? Smoking status: Never Smoker   ??? Smokeless tobacco: Never Used   ??? Alcohol use 0.0 oz/week   ??? Drug use: No   ??? Sexual activity: Not Asked     Other Topics Concern   ??? None     Social History Narrative   ??? None       Allergies No Known Allergies    Home Medications     Medications that the patient states to be currently taking   Medication Sig   ??? albuterol (2.5 mg/50mL) 0.083% nebulizer solution Inhale contents of 1 ampulr (55mL=2.5 mg total) by nebulization every four (4) hours as needed.   ??? albuterol 90 mcg/act inhaler Inhale 2 puffs every six (6) hours as needed.   ??? allopurinol 100 mg tablet Take 1 tablet (100 mg total) by mouth daily.   ??? apixaban 5 mg tablet Take 1 tablet (5 mg total) by mouth two (2) times daily.   ??? busPIRone 15 mg tablet Take 1 tablet (15 mg total) by mouth two (2) times daily.   ??? carvedilol 25 mg tablet Take 1 tablet (25 mg total) by mouth two (2) times daily.   ??? DULoxetine 60 mg DR capsule Take 1 capsule (60 mg total) by mouth daily.   ??? Fluticasone-Umeclidin-Vilant (TRELEGY ELLIPTA) 100-62.5-25 MCG/INH AEPB Inhale 1 puff daily.   ??? furosemide 40 mg tablet Take 1 tablet (40 mg total) by mouth two (2) times daily.   ??? gabapentin 400 mg capsule Take 2 capsules (800 mg total) by mouth three (3) times daily.   ??? hydrocodone-acetaminophen (NORCO) 10-325 mg tablet Take 1 tablet by mouth every four (4) to six (6) hours .   ??? linagliptin-metFORMIN (JENTADUETO) 2.06-998 MG TABS Take 1 tablet by mouth two (2) times daily.   ??? LORazepam 2 mg tablet Take 2 mg by mouth every four (4) hours as needed.   ??? losartan 100 mg tablet Take 1 tablet (100 mg total) by mouth daily.   ??? NIFEdipine (ADALAT CC) 30 mg 24 hr tablet Take 1 tablet (30 mg total) by mouth daily.   ??? pantoprazole 40 mg DR tablet Take 1 tablet (40 mg total) by mouth at bedtime.   ??? pravastatin 40 mg tablet Take 1 tablet (40 mg total) by mouth at bedtime.   ??? senna 8.6 mg tablet Take 1 tablet by mouth at bedtime as needed for Constipation.   ??? Spacer/Aero-Holding Chambers (AEROCHAMBER PLUS FLO-VU) MISC Use as directed for inhalation       Physical Exam   Temp:  [36 ???C (96.8 ???F)-36.8 ???C (98.2 ???F)] 36.5 ???C (97.7 ???F)  Heart Rate:  [78-118] 89  Resp:  [10-22] 16 BP: (98-134)/(62-97) 106/71  NBP Mean:  [75-107] 80  FiO2 (%):  [60 %] 60 %  SpO2:  [88 %-  96 %] 90 %  I/O: I/O last 2 completed shifts:  In: 1225 [P.O.:390; I.V.:225; IV Piggyback:610]  Out: 3750 [Urine:3750]    General - Mild distress, sitting up in bed with BiPAP on  Neuro - A&Ox4, no FND  HEENT - PERRL, EOMI, MMM  Cardiovascular - RRR no m/r/g, unable to assess JVP, 2+ radial pulses b/l  Lungs - Increased WOB, BiPAP on, intermittently tachypneic. Decreased breath sounds LLL, mild anterior expiratory wheezing.  Skin - No visible rashes or lesions  Abdomen - Soft, NT/ND  Extremeties - Warm and well-perfused. Trace to no pre-tibial edema. 1++ pitting edema over ankles b/l    Labs   CBC  Recent Labs      02/24/17   0309  02/26/2017   1139   WBC  7.74  10.72*   HGB  10.8*  13.6   HCT  33.2*  42.2   MCV  87.4  90.6   PLT  169  207     BMP  Recent Labs      02/24/17   1606  02/24/17   0304  02/13/2017   1139   NA  138  137  129*   K  3.8  4.0  4.7   CL  94*  97  93*   CO2  28  25  25    BUN  18  22   --    CREAT  0.92  0.92  1.30   CALCIUM  8.7  8.1*   --    MG  1.6  1.5   --      Coags  Recent Labs      01/30/2017   1139   INR  1.4   PT  16.2*       Microbiology   Sputum culture (12/29): pending  RPAN (12/29): pending    Imaging     CXR (12/29):  IMPRESSION:  Unchanged enlarged cardiomediastinal silhouette from combination of mediastinal adenopathy and left hilar mass extending into upper lobe, and lower lobe with left lymphangitic carcinomatosis. Unchanged moderate left pleural effusion.  ???  CT Chest W/o Contrast (12/20):  IMPRESSION:  1.   Redemonstration of coalescent airspace and nodular consolidation, most prominent in the left upper lobe and lingula with peripheral groundglass associated with Unchanged to slightly more prominent mediastinal and bilateral hilar lymphadenopathy.   This finding is nonspecific but concerning for malignancy versus pneumonia with etiologies including fungal infection. 2.  Increase in size of pleural and pericardial effusions and anasarca consistent with volume overload.  3.   Dilated main pulmonary artery measuring 39 mm reflecting pulmonary arterial hypertension.  4.  Severe coronary artery calcifications.     Assessment and Plan   HALFORD GOETZKE is a 76 y.o. male with a history of COPD (on 3LNC), HFpEF, afib, HTN, DM2, PAD s/p femoral graft, prior prostate CA who was admitted for hypoxemic respiratory failure requiring BiPAP.    RESP:  #Acute on chronic hypoxemic respiratory failure  Admitted from clinic requiring BiPAP in setting of decreased arterial PaO2 and desats to low 80s. Recently admitted w/ bronch showing chronic colonization with aspergillus. Has features c/f malignancy on imaging.  - Wean off BiPAP to HFNC as tolerated, goal SpO2 88-92%  - Serial VBGs  - Malignancy work-up as outpatient, as below  - Empiric IV Vanc/Zosyn, azithromycin, as below  - CTM BPs, asymmetric in arms but low c/f aortic pathology given absence of CP    #Chronic COPD  Increased sputum production x 2 months, slight wheezing and recently treated with steroids while hospitalized. Current presentation less c/w COPD exacerbation.  - Duonebs q4h  - Inhaled Flovent BID  - Empiric abx, as below    ID:  #Leukocytosis  Afebrile. Increased sputum production x 2 months otherwise no localizing signs of infection. Did receive course of methylprednisolone 12/18-12/23 on recent hospitalization. 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  - ID consulted, appreciate recs  - RPAN negative  - F/u final sputum cultures, AFB culture from 12/13, blood cultures  - Obtain urine histoplasma Ag, hold antifungal tx at this time  - Empiric IV vanc/Zosyn, azithromycin    CV:  #HFpEF  Home dose lasix PO 40mg  BID  - IV lasix PRN to maintain goal net -1L  - BID lytes, daily weights, strict I&O's  - F/u TTE     #Afib  Rate-controlled. CHADS2VaSC 3-4  - Hold home carvedilol 25 mg BID  - Hold home apixaban 5 mg BID HEME:  #Hypervascular Liver Lesions/Mediastinal LAD- c/f malignancy, hx prostate cancer treated with total prostatectomy.  - Outpatient work-up for malignancy. Current presentation more amenable to IR biopsy of liver lesions, EBUS less given location of lung lesions and prior complications s/p bronchoscopy d/t poor respiratory status    CHRONIC  #HTN  SBP soft in 90-100s.   - Hold home BP meds: losartan 100 mg daily, carvedilol 25 mg BID, nifedipine ER 30 mg daily    #Chronic???pain   Localized to lower back and lower extremities  - Hold home Norco 5-325 q6h PRN  - Tylenol for mild/mod pain PRN, oxycodone for severe pain PRN    #GERD chronic/stable  - Pantoprazole IV, switch to PO 40 mg daily 12/30  ???  #Depression/Anxiety  Mood stable, no SI, no anxiety  - Restart home buspirone 15 mg BID, duloxetine DR 60    #PAD  s/p femoral-femoral graft 2009.  - Holding clopidogrel 75 mg daily in anticipation of biopsy to work up LAD/Liver lesions  ???  #HLD chronic/stable  - Pravastatin 40 mg nightly     #DM2 with associated diabetic peripheral neuropathy  - Restart diet now that off BiPAP  - Gabapentin 800 mg TID     F - Diabetic/low sodium diet  A - PRN Oxy/Tylenol for chronic pain (on Norco PRN at home)  S - None  T - SQ lovenox (in setting of malignancy),  H - 30 degrees  U - PPI  G - Accuchecks q6hrs, plan to re-start insulin 12/30  B - Senna qhs PRN  I - PIV 22g R hand (12/28- ), PIV 20g L antecub (12/28- )    Disposition: ICU level care required for hypoxemic respiratory failure requiring BiPAP    Code Status: Full Code  Contact:  Primary Emergency Contact: [place], Home Phone: [phone]    DWA Dr. Oletta Lamas, MD, PGY-1  [phone]

## 2017-02-25 NOTE — Other
Patients Clinical Goal:   Clinical Goal(s) for the Shift: 1- maintain hemodynamic stability with map >32mmhg 2- maintain adequate oxygenation with spo2 >88% while titrating fiO2 as tolerted 3- maintain patient safety and comfort 4- pain control  Identify possible barriers to advancing the care plan: none evident  Stability of the patient: Moderately Unstable - medium risk of patient condition declining or worsening    End of Shift Summary:   1- patient remains hemodynamically stable with map >52mmhg. Nsr. occassional PVCs and PACs rare. Aferbile.   2- patient remained adequately oxygenated with spo2 >88%. Remains on HFNC 60%40L. Most recent VBG: Results for STOY, FENN (MRN [id number]) as of 02/25/2017 06:04   Ref. Range 02/25/2017 03:44   pH Latest Ref Range: No Reference Range  7.39   pCO2 Latest Ref Range: No Reference Range mm Hg 50   pO2 Latest Ref Range: No Reference Range mm Hg 46   Bicarbonate Latest Ref Range: No Reference Range mmol/L 29.9   Base Excess Latest Ref Range: No Reference Range mmol/L 5   O2 Sat/Measured Latest Ref Range: No Reference Range % 79.8   Inspired O2 Unknown 60% HFNC   No sob experienced or reported by patient. Intermittent productive cough with thick tan moderate to large secretions that patient able to clear on own  3- patient remained safe and comfortable thru shift with frequent rn rounds made.   4- patient with continuous back and overall generalized pain. Chronic pain patient. Received home dose of 10mg  oxycodone prn x2 last night for pain control.     Patient remains a/ox4   tolerating PO no problem.  Voids adequte uop to urinal  No BM on night shift   Lytes repleted this am for K 3.8 and Mg 1.8- see mar for further details.

## 2017-02-25 NOTE — Progress Notes
Pharmaceutical Services ??? Vancomycin Dosing (Ongoing)    Patient Name: Ryan Shepard  MRN: [id number]  Age: 76 y.o.  Sex: male    Vancomycin per Pharmacy Consult Order     Start     Ordered    02-26-2017 1441  vancomycin per pharmacy  Per Protocol     Question Answer Comment   Indication Empiric    Goal Trough Serum Level 15-20 mcg/mL    Has patient received a dose of this drug within the past 72 hours? Yes        Feb 26, 2017 1443        Vital Signs/Other Objective Data  Allergies: Patient has no known allergies.    BP 131/81  ~ Pulse 95  ~ Temp 36.7 ???C (98.1 ???F) (Oral)  ~ Resp 17  ~ Wt (!) 103.1 kg (227 lb 4.7 oz)  ~ SpO2 90%  ~ BMI 29.99 kg/m???     Intake/Output Summary (Last 24 hours) at 02/25/17 1311  Last data filed at 02/25/17 1100   Gross per 24 hour   Intake             1280 ml   Output             2350 ml   Net            -1070 ml       Medications  Med Administrations and Associated Flowsheet Values (last 72 hours)  Vancomycin administration    Date/Time Action Medication Dose Rate    02/25/17 0145 New Bag/ Syringe/ Cartridge    vancomycin 1.25 g in sodium chloride 0.9% 250 mL IVPB 1.25 g 250 mL/hr    02/24/17 1410 New Bag/ Syringe/ Cartridge    vancomycin 1.25 g in sodium chloride 0.9% 250 mL IVPB 1.25 g 250 mL/hr    02/24/17 0106 New Bag/ Syringe/ Cartridge    vancomycin 1.25 g in sodium chloride 0.9% 250 mL IVPB 1.25 g 250 mL/hr    02/26/17 1300 New Bag/ Syringe/ Cartridge    vancomycin 1,500 mg in sodium chloride 0.9% 500 mL IVPB 1,500 mg 333.33 mL/hr        Labs (most recent)  White Blood Cell Count   Date Value Ref Range Status   02/24/2017 7.74 4.16 - 9.95 x10E3/uL Final   01/25/2008 7.82 3.28 - 9.29 x10E3/uL      Urea Nitrogen   Date Value Ref Range Status   02/25/2017 13 7 - 22 mg/dL Final   45/40/9811 33 (H) 7 - 23 mg/dL Final     Creatinine   Date Value Ref Range Status   02/25/2017 0.88 0.60 - 1.30 mg/dL Final   91/47/8295 0.9 0.5 - 1.3 mg/dL      Vancomycin,trough (mcg/mL)   Date/Time Value 02/25/2017 1207 17.3       Assessment  Indication Empiric  Goal trough level 15-20 mcg/mL  Revised Vd 72.2L  Revised k 0.07/hr  This patient is receiving dialysis: No    Plan  CORIN TILLY is a 76 y.o. male who has been referred to pharmacy for vancomycin dosing. The indication for vancomycin is Empiric. Based on the measured vancomycin level, the dose should be maintained.      Continue vancomycin 1250 mg IV q12h     Pharmacy will continue to monitor the patient's clinical progress. The next vancomycin trough is scheduled in 2-5 days depending on renal function and clinical status.    Keziah Avis TZU [name], PharmD, 02/25/2017, 1:11  PM

## 2017-02-25 NOTE — Other
Patients Clinical Goal:   Clinical Goal(s) for the Shift: 1)Maintain hemodynamic stability  MAP>65 2)Maintain SPO2>88% wean as tolerated 3) Maintian the comfort and safety of the pt  Identify possible barriers to advancing the care plan:Respiratory status  Stability of the patient: Moderately Unstable - medium risk of patient condition declining or worsening    End of Shift Summary:   1) Pt continues in NSR with occassional PVCs and PACs.  MAP>65 today.    2)Pt on Bipap at the start of the shift weaned to High Flow at 1330 Pt continues to sat 93-96%. Pt utilizing incentive spirometer  3)Pt restarted on home medications this afternoon for chronic back and neck pain. Pt up to bedside commode and chair today. Diet well tolerated

## 2017-02-26 ENCOUNTER — Ambulatory Visit: Payer: MEDICARE

## 2017-02-26 ENCOUNTER — Ambulatory Visit: Payer: PRIVATE HEALTH INSURANCE

## 2017-02-26 LAB — Glucose,POC
GLUCOSE,POC: 141 mg/dL — ABNORMAL HIGH (ref 65–99)
GLUCOSE,POC: 130 mg/dL — ABNORMAL HIGH (ref 65–99)
GLUCOSE,POC: 187 mg/dL — ABNORMAL HIGH (ref 65–99)

## 2017-02-26 LAB — Basic Metabolic Panel
CHLORIDE: 90 mmol/L — ABNORMAL LOW (ref 96–106)
CREATININE: 0.94 mg/dL (ref 0.60–1.30)
POTASSIUM: 4 mmol/L (ref 3.6–5.3)
SODIUM: 134 mmol/L — ABNORMAL LOW (ref 135–146)
UREA NITROGEN: 10 mg/dL (ref 7–22)

## 2017-02-26 LAB — Blood Gases,venous
O2 SATURATION/MEASURED: 33.4
PO2,VENOUS: 48 mmHg

## 2017-02-26 LAB — CBC
HEMOGLOBIN: 12.7 g/dL — ABNORMAL LOW (ref 13.5–17.1)
HEMOGLOBIN: 12.9 g/dL — ABNORMAL LOW (ref 13.5–17.1)

## 2017-02-26 LAB — Phosphorus: PHOSPHORUS: 3.4 mg/dL (ref 2.3–4.4)

## 2017-02-26 LAB — Magnesium
MAGNESIUM: 1.7 meq/L (ref 1.4–1.9)
MAGNESIUM: 1.8 meq/L (ref 1.4–1.9)
MAGNESIUM: 2.1 meq/L — ABNORMAL HIGH (ref 1.4–1.9)

## 2017-02-26 MED ADMIN — AZITHROMYCIN IVPB 250 ML: 500 mg | INTRAVENOUS | @ 20:00:00 | Stop: 2017-02-28 | NDC 63323039810

## 2017-02-26 MED ADMIN — PIPERACILLIN-TAZOBACTAM 3.375 GM/50 ML RTU (EXT 4HR): 3.375 g | INTRAVENOUS | @ 09:00:00 | Stop: 2017-03-02 | NDC 00206886102

## 2017-02-26 MED ADMIN — FUROSEMIDE 10 MG/ML IJ SOLN: 80 mg | INTRAVENOUS | @ 17:00:00 | Stop: 2017-02-26 | NDC 36000028425

## 2017-02-26 MED ADMIN — VANCOMYCIN 250 ML IVPB: 1.25 g | INTRAVENOUS | @ 09:00:00 | Stop: 2017-02-26 | NDC 47781059791

## 2017-02-26 MED ADMIN — FUROSEMIDE 10 MG/ML IJ SOLN: 80 mg | INTRAVENOUS | @ 23:00:00 | Stop: 2017-02-26 | NDC 36000028425

## 2017-02-26 MED ADMIN — PROPOFOL 200 MG/20ML IV EMUL: 50 mg | INTRAVENOUS | @ 21:00:00 | Stop: 2017-02-26 | NDC 63323026929

## 2017-02-26 MED ADMIN — BUSPIRONE HCL 5 MG PO TABS: 15 mg | ORAL | @ 05:00:00 | Stop: 2017-02-28 | NDC 16729020001

## 2017-02-26 MED ADMIN — OXYCODONE HCL 5 MG PO TABS: 10 mg | ORAL | @ 13:00:00 | Stop: 2017-03-06 | NDC 00406055262

## 2017-02-26 MED ADMIN — MAGNESIUM SULFATE 2 GM/50ML IV SOLN: 2 g | INTRAVENOUS | @ 11:00:00 | Stop: 2017-02-26 | NDC 44567042024

## 2017-02-26 MED ADMIN — BUSPIRONE HCL 5 MG PO TABS: 15 mg | ORAL | @ 17:00:00 | Stop: 2017-02-28 | NDC 16729020001

## 2017-02-26 MED ADMIN — ALBUTEROL SULFATE (5 MG/ML) 0.5% IN NEBU: 2.5 mg | RESPIRATORY_TRACT | @ 08:00:00 | Stop: 2017-03-07 | NDC 00487990130

## 2017-02-26 MED ADMIN — ACETAMINOPHEN 500 MG PO TABS: 500 mg | ORAL | @ 05:00:00 | Stop: 2017-03-01 | NDC 00904673061

## 2017-02-26 MED ADMIN — PIPERACILLIN-TAZOBACTAM 3.375 GM/50 ML RTU (EXT 4HR): 3.375 g | INTRAVENOUS | Stop: 2017-03-02 | NDC 00206886102

## 2017-02-26 MED ADMIN — FENTANYL CITRATE (PF) 100 MCG/2ML IJ SOLN: 25 ug | INTRAVENOUS | @ 23:00:00 | Stop: 2017-02-26 | NDC 00409909422

## 2017-02-26 MED ADMIN — PROPOFOL INFUSION 10 MG/ML: 7755 ug/min | INTRAVENOUS | @ 23:00:00 | Stop: 2017-03-14 | NDC 63323026965

## 2017-02-26 MED ADMIN — GABAPENTIN 400 MG PO CAPS: 800 mg | ORAL | @ 05:00:00 | Stop: 2017-02-28 | NDC 68084077401

## 2017-02-26 MED ADMIN — IPRATROPIUM BROMIDE 0.02 % IN SOLN: 500 ug | RESPIRATORY_TRACT | @ 19:00:00 | Stop: 2017-03-07 | NDC 00487980101

## 2017-02-26 MED ADMIN — LIDOCAINE 5 % EX PTCH: 2 | TRANSDERMAL | @ 06:00:00 | Stop: 2017-02-28 | NDC 00591352530

## 2017-02-26 MED ADMIN — PIPERACILLIN-TAZOBACTAM 3.375 GM/50 ML RTU (EXT 4HR): 3.375 g | INTRAVENOUS | @ 17:00:00 | Stop: 2017-03-02 | NDC 00206886102

## 2017-02-26 MED ADMIN — PRAVASTATIN SODIUM 20 MG PO TABS: 40 mg | ORAL | @ 05:00:00 | Stop: 2017-02-28 | NDC 51079045820

## 2017-02-26 MED ADMIN — GABAPENTIN 400 MG PO CAPS: 800 mg | ORAL | @ 14:00:00 | Stop: 2017-02-28 | NDC 68084077401

## 2017-02-26 MED ADMIN — VANCOMYCIN 250 ML IVPB: 1.25 g | INTRAVENOUS | @ 23:00:00 | Stop: 2017-02-26

## 2017-02-26 MED ADMIN — LIDOCAINE 5 % EX PTCH: 2 | TRANSDERMAL | @ 21:00:00 | Stop: 2017-02-28

## 2017-02-26 MED ADMIN — ALBUTEROL SULFATE (5 MG/ML) 0.5% IN NEBU: 2.5 mg | RESPIRATORY_TRACT | @ 13:00:00 | Stop: 2017-03-07 | NDC 00487990130

## 2017-02-26 MED ADMIN — FLUTICASONE PROPIONATE HFA 220 MCG/ACT IN AERO: 1 | RESPIRATORY_TRACT | @ 05:00:00 | Stop: 2017-03-02 | NDC 00173072020

## 2017-02-26 MED ADMIN — FENTANYL 2.5 MG/100 ML NS DRIP: 50 ug/h | INTRAVENOUS | @ 23:00:00 | Stop: 2017-03-13 | NDC 00409909461

## 2017-02-26 MED ADMIN — DULOXETINE HCL 60 MG PO CPEP: 60 mg | ORAL | @ 17:00:00 | Stop: 2017-02-28 | NDC 00904645461

## 2017-02-26 MED ADMIN — FENTANYL 2.5 MG/100 ML NS DRIP: 50 ug/h | INTRAVENOUS | @ 23:00:00 | Stop: 2017-03-13

## 2017-02-26 MED ADMIN — ALBUTEROL SULFATE (5 MG/ML) 0.5% IN NEBU: 2.5 mg | RESPIRATORY_TRACT | @ 01:00:00 | Stop: 2017-03-07 | NDC 00487990130

## 2017-02-26 MED ADMIN — NIFEDIPINE ER OSMOTIC RELEASE 30 MG PO TB24: 30 mg | ORAL | @ 06:00:00 | Stop: 2017-02-27 | NDC 50268059715

## 2017-02-26 MED ADMIN — PHENYLEPHRINE 10 MG/250 ML DRIP: 40200 ug/min | INTRAVENOUS | @ 21:00:00 | Stop: 2017-03-01 | NDC 76014000410

## 2017-02-26 MED ADMIN — MIDAZOLAM HCL 2 MG/2ML IJ SOLN: .5 mg | INTRAVENOUS | @ 23:00:00 | Stop: 2017-02-26 | NDC 00409230517

## 2017-02-26 MED ADMIN — MAGNESIUM SULFATE 2 GM/50ML IV SOLN: 2 g | INTRAVENOUS | @ 05:00:00 | Stop: 2017-02-26 | NDC 44567042024

## 2017-02-26 MED ADMIN — FLUTICASONE PROPIONATE HFA 220 MCG/ACT IN AERO: 1 | RESPIRATORY_TRACT | @ 19:00:00 | Stop: 2017-03-02 | NDC 00173072020

## 2017-02-26 MED ADMIN — SODIUM CHLORIDE 0.9% IV SOLN (250 ML): 510 mL/h | INTRAVENOUS | @ 09:00:00 | Stop: 2017-03-14 | NDC 00338004902

## 2017-02-26 MED ADMIN — PANTOPRAZOLE SODIUM 40 MG PO TBEC: 40 mg | ORAL | @ 17:00:00 | Stop: 2017-02-27 | NDC 68084081309

## 2017-02-26 MED ADMIN — ALBUTEROL SULFATE (5 MG/ML) 0.5% IN NEBU: 2.5 mg | RESPIRATORY_TRACT | @ 19:00:00 | Stop: 2017-03-07 | NDC 00487990130

## 2017-02-26 MED ADMIN — GABAPENTIN 400 MG PO CAPS: 800 mg | ORAL | @ 21:00:00 | Stop: 2017-02-28

## 2017-02-26 MED ADMIN — LIDOCAINE HCL (PF) 2 % IJ SOLN: 20 mL | ENDOTRACHEOPULMONARY | @ 21:00:00 | Stop: 2017-02-27

## 2017-02-26 MED ADMIN — IPRATROPIUM BROMIDE 0.02 % IN SOLN: 500 ug | RESPIRATORY_TRACT | @ 08:00:00 | Stop: 2017-03-07 | NDC 00487980101

## 2017-02-26 MED ADMIN — IPRATROPIUM BROMIDE 0.02 % IN SOLN: 500 ug | RESPIRATORY_TRACT | @ 01:00:00 | Stop: 2017-03-07 | NDC 00487980101

## 2017-02-26 MED ADMIN — ROCURONIUM BROMIDE 50 MG/5ML IV SOLN: 100 mg | INTRAVENOUS | @ 21:00:00 | Stop: 2017-02-26 | NDC 39822420002

## 2017-02-26 MED ADMIN — FUROSEMIDE 10 MG/ML IJ SOLN: 80 mg | INTRAVENOUS | @ 06:00:00 | Stop: 2017-02-26 | NDC 36000028425

## 2017-02-26 MED ADMIN — SODIUM CHLORIDE 0.9 % IV BOLUS: 1000 mL | INTRAVENOUS | @ 23:00:00 | Stop: 2017-02-27

## 2017-02-26 MED ADMIN — IPRATROPIUM BROMIDE 0.02 % IN SOLN: 500 ug | RESPIRATORY_TRACT | @ 13:00:00 | Stop: 2017-03-07 | NDC 00487980101

## 2017-02-26 MED ADMIN — PHENYLEPHRINE 10 MG/250 ML DRIP: 40200 ug/min | INTRAVENOUS | Stop: 2017-03-01

## 2017-02-26 MED ADMIN — LIDOCAINE HCL (PF) 2 % IJ SOLN: 20 mL | ENDOTRACHEOPULMONARY | @ 21:00:00 | Stop: 2017-02-27 | NDC 63323049507

## 2017-02-26 MED ADMIN — OXYCODONE HCL 5 MG PO TABS: 10 mg | ORAL | @ 07:00:00 | Stop: 2017-03-06 | NDC 00406055262

## 2017-02-26 MED ADMIN — INSULIN ASPART 100 UNIT/ML SC SOPN: 3 mL | SUBCUTANEOUS | @ 07:00:00 | Stop: 2017-03-03 | NDC 00169633910

## 2017-02-26 MED ADMIN — OXYCODONE HCL 5 MG PO TABS: 10 mg | ORAL | @ 01:00:00 | Stop: 2017-03-06 | NDC 00406055262

## 2017-02-26 MED ADMIN — ETOMIDATE 2 MG/ML IV SOLN: 31 mg | INTRAVENOUS | @ 21:00:00 | Stop: 2017-02-26 | NDC 00143950710

## 2017-02-26 NOTE — Progress Notes
.  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  IE/SUBJECTIVE:   IE:  - oxygen increased to FiO2 70% with 40L (HFNC), CXR continues to worsen    Subjective:  - patient feels more short of breath, wants to know what is causing his shortness of breath    OBJECTIVE:   VITALS:  Temp:  [36.1 ???C (97 ???F)-37.1 ???C (98.8 ???F)] 36.4 ???C (97.5 ???F)  Heart Rate:  [86-102] 93  Resp:  [11-22] 17  BP: (118-176)/(72-154) 120/87  NBP Mean:  [86-161] 98  FiO2 (%):  [60 %-70 %] 70 %  SpO2:  [86 %-92 %] 89 %  12/30 0701 - 12/31 0700  In: 2001.5 [P.O.:1215; I.V.:186.5]  Out: 2650 [Urine:2650]     Wt Readings from Last 1 Encounters:   02/26/17 99.4 kg (219 lb 2.2 oz)     Recent Labs      02/26/17   0829  02/25/17   2325  02/25/17   1208  02/25/17   0751   GLUCOSEPOC  130*  141*  153*  152*   }  Oxygen Therapy  SpO2: (!) 89 %  O2 Device: High flow nasal cannula  FiO2 (%): 70 %  O2 Flow Rate (L/min): 49 L/min     FiO2 (%):  [60 %-70 %] 70 %  Rate:  [16] 16    PHYSICAL EXAM:  General - Well-appearing, NAD  Neuro - A&Ox4, no FND  HEENT - EOMI  Cardiovascular - RRR no m/r/g, unable to assess JVP d/t girth, 2+ radial pulses b/l  Lungs - normal wob, decreased/absent breath sounds on posterior left lung  Skin - No visible rashes or lesions  Abdomen - Soft, NT/ND  Extremeties - Warm and well-perfused. Trace to no pre-tibial edema. 1+ pitting edema over ankles b/l, improved    LABORATORY DATA:     Results for orders placed or performed during the hospital encounter of 18-Mar-2017   CBC   Result Value Ref Range    White Blood Cell Count 7.74 4.16 - 9.95 x10E3/uL Red Blood Cell Count 3.80 (L) 4.41 - 5.95 x10E6/uL    Hemoglobin 10.8 (L) 13.5 - 17.1 g/dL    Hematocrit 45.4 (L) 38.5 - 52.0 %    Mean Corpuscular Volume 87.4 79.3 - 98.6 fL    Mean Corpuscular Hemoglobin 28.4 26.4 - 33.4 pg    MCH Concentration 32.5 31.5 - 35.5 g/dL    Red Cell Distribution Width-SD 43.8 36.9 - 48.3 fL    Red Cell Distribution Width-CV 13.8 11.1 - 15.5 %    Platelet Count, Auto 169 143 - 398 x10E3/uL    Mean Platelet Volume 9.8 9.3 - 13.0 fL    Nucleated RBC%, automated 0.0 No Ref. Range %    Absolute Nucleated RBC Count 0.00 0.00 - 0.00 x10E3/uL    Neutrophil Abs (Prelim) 5.09 See Absolute Neut Ct. x10E3/uL   Differential, Automated   Result Value Ref Range    Neutrophil Percent, Auto 65.8 No Ref. Range %  Lymphocyte Percent, Auto 15.8 No Ref. Range %    Monocyte Percent, Auto 14.7 No Ref. Range %    Eosinophil Percent, Auto 0.8 No Ref. Range %    Basophil Percent, Auto 0.6 No Ref. Range %    Immature Granulocytes% 2.3 No Reference Range %    Absolute Neut Count 5.09 1.80 - 6.90 x10E3/uL    Absolute Lymphocyte Count 1.22 (L) 1.30 - 3.40 x10E3/uL    Absolute Mono Count 1.14 (H) 0.20 - 0.80 x10E3/uL    Absolute Eos Count 0.06 0.00 - 0.50 x10E3/uL    Absolute Baso Count 0.05 0.00 - 0.10 x10E3/uL    Absolute Immature Gran Count 0.18 (H) 0.00 - 0.04 x10E3/uL       ABG:   Recent Labs      02/24/17   0816   PHART  7.40   PCO2ART  44*   PO2ART  70*   BICARBART  26.7*   BEART  2     VBG: VBG  Recent Labs      02/26/17   0555  02/25/17   1449  02/25/17   1044  02/25/17   0344  02/24/17   2154  02/24/17   1606  02/24/17   1312  02/24/17   0303   PHVEN  7.33  7.34  7.37  7.39  7.39  7.41  7.38  7.37   PCO2VEN  64  58  54  50  53  47  49  50   PO2VEN  24  39  35  46  24  34  28  36   BICARBVEN  32.8  30.4  30.6  29.9  31.4  29.0  28.4  27.8       Micro:    Blood Cx x 2 (12/28): NGTD   Respiratory cx (12/28): Few probable candida albicans  RVP (12/28): WNL    IMAGING: CXR (12/30: Per primary team read, unchanged from 12/29 CXR (see below)    CXR (12/29):  IMPRESSION:  Unchanged enlarged cardiomediastinal silhouette from combination of mediastinal adenopathy and left hilar mass extending into upper lobe, and lower lobe with left lymphangitic carcinomatosis. Unchanged moderate left pleural effusion.      ASSESSMENT & PLAN:   Ryan Shepard???is a 76 Shepard a history of COPD (on 3LNC), HFpEF, afib, HTN, DM2, PAD s/p femoral graft, prior prostate CA who was admitted for hypoxemic respiratory failure requiring BiPAP.  ???  RESP:  #Acute on chronic hypoxemic respiratory failure  Admitted from clinic requiring BiPAP in setting of decreased arterial PaO2 and desats to low 80s. Recently admitted w/ bronch showing chronic colonization with aspergillus. Has features c/f malignancy on imaging. Respiratory status continues to worsen with worsened xray.   - intubate patient today with bronch  - Serial VBGs   - Malignancy work-up as outpatient, as below  - Empiric IV Vanc/Zosyn, azithromycin, as below  ???  #Chronic COPD  Increased sputum production x 2 months, slight wheezing and recently treated with steroids while hospitalized. Current presentation less c/w COPD exacerbation.  - Space duonebs from q4 to q6h  - Inhaled Flovent BID  - Empiric abx, as below  ???  ID:  #Leukocytosis  Afebrile. Increased sputum production x 2 months otherwise no localizing signs of infection. Did receive course of methylprednisolone 12/18-12/23 on recent hospitalization. 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  - ID consulted, appreciate recs  - RVP negative  - F/u final sputum cultures, AFB culture from  12/13, blood cultures  - F/u urine histoplasma Ag (sendout lab), hold antifungal tx at this time  - Continue IV Vanc/Zosyn, azithromycin  ???  CV:  #HFpEF  Home dose lasix PO 40mg  BID  - IV lasix PRN to maintain goal net -1L  - BID lytes, daily weights, strict I&O's  - F/u TTE (ordered)  ???  #Afib Rate-controlled. CHADS2VaSC 3-4  - Hold home carvedilol 25 mg BID  - Hold home apixaban 5 mg BID  ???  HEME:  #Hypervascular Liver Lesions/Mediastinal LAD- c/f malignancy, hx prostate cancer treated with total prostatectomy.  - Outpatient work-up for malignancy. Current presentation more amenable to IR biopsy of liver lesions, EBUS less given location of lung lesions and prior complications s/p bronchoscopy d/t poor respiratory status  ???  CHRONIC  #HTN  - Restart home nifedipine 30mg  daily  - Hold other home BP meds: losartan 100 mg daily, carvedilol 25 mg BID  ???  #Chronic???pain   Localized to lower back and lower extremities  - Hold home Norco 5-325 q6h PRN  - Tylenol for mild/mod pain PRN, oxycodone for severe pain PRN, lidocaine patch  ???  #GERD chronic/stable  - Start pantoprazole PO 40 mg daily 12/31  ???  #Depression/Anxiety  Mood stable, no SI, no anxiety  - Home buspirone 15 mg BID, duloxetine DR 60  ???  #PAD  s/p femoral-femoral graft 2009.  - Holding clopidogrel 75 mg daily  ???  #HLD chronic/stable  - Pravastatin 40 mg qhs  ???  #DM2 with associated diabetic peripheral neuropathy  - SSI#2  - Gabapentin 800 mg TID   ???  F - npo  A - PRN Oxy/Tylenol for chronic pain (on Norco PRN at home)  S - None  T - holding  H - 30 degrees  U - PPI  G - Accuchecks q6hrs, SSI#2  B - Senna qhs PRN  I - PIV 22g R hand (12/28- ), PIV 20g L antecub (12/28- )  ???  Disposition: ICU level care required for hypoxemic respiratory failure requiring intubation  ???  Code Status: Full Code  Contact:  Primary Emergency Contact: Nollan Muldrow,Johnnie, Home Phone: [phone]    Discussed with attending: Christiane Ryan Shepard., MD    Lulu Riding, MD, PGY2

## 2017-02-26 NOTE — Other
Patients Clinical Goal:   Clinical Goal(s) for the Shift: 1. Maintain MAP >65 2. Oxygenation between 88-92% 3. I/O goal of -1L 4. Maintain comfort and safety  Identify possible barriers to advancing the care plan:   Stability of the patient: Unstable - high likelihood or risk of patient condition declining or worsening; patient condition declining or worsening    End of Shift Summary:   1. Pt was able to maintain MAP >65. Home med Nifedipine ER 30mg  was readded to scheduled medications d/t high blood pressure (max 162/90).   2. Patient sustained saturations of 87% this morning therefore increased O2 requirements to 70%/40L.   3. Pt unable to meet I/O goal. Overnight total I/O was -663.93mL. One time dose of 80mg  lasix was given d/t worsening chest x-ray.   4. Pt has chronic pain. Pt was able to rest in between pain med administration, however pt requests medication 30 minutes before due time. Will address with day RN. Pt safety, emotional support, and comfort maintained as much as possible throughout shift.

## 2017-02-26 NOTE — Procedures
PULMONARY PROCEDURE NOTE    DATE OF PROCEDURE: 02/26/2017    TYPE OF PROCEDURE:  1. Flexible video bronchoscopy.  2. Bronchoalveolar lavage.    ATTENDING PHYSICIAN:  Emeline Gins, MD    ASSISTING PHYSICIAN: Newt Lukes, MD    PRE-PROCEDURE DIAGNOSIS / INDICATION  1. Pneumonia with mucous plugging  2. Confirm ETT placement  3. Therapeutic suctioning of thick secretions  4. Extrinsic compression of LUL    BRONCHOSCOPIC FINDINGS:  1. Right sided airways with thin, clear secretions throughout, therapeutically suctioned  2. LUL sub-segments both apicoposterior and anterior segments with evidence of extrinsic compression, as well as intrinsic airway narrowing 2/2 endobronchial abnormal mucosa and nodularity  3. LLL with thick, copious purulent secretions, s/p therapeutic suctioning     MEDICATIONS / SEDATION:  Propofol 60mg  IV total (30mg  IV x2)    DESCRIPTION OF PROCEDURE:  After discussion of risks, benefits, and alternatives of the procedure, informed consent was obtained from the patient who agreed to proceed. This was a flexible bronchoscopy for evaluation of left sided pneumonia and concern for extrinsic compression of airways with LUL collapse.   A time out was performed prior to initiation of the procedure. The patient received sedation with the aforementioned medications, and the bronchoscope was inserted into the ETT and into the trachea.     All lobar segments and sub-segments in the right and left bilateral lungs were visualized. 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 There were thin, clear secretions throughout the right side, which were therapeutically suctioned. There were no endobronchial masses, lesions, or bleeding on the right side. The left upper lobe and lingula were then examined. The left upper lobe apicoposterior and anterior sub segments showed evidence of both extrinsic compression as well as intrinsic narrowing from abnormal mucosa (see photo below). The lingula was not compressed. The LLL had copious, thick purulent secretions which were therapeutically suctioned.  The bronchoscope was wedged in the lingula and bronchoalveolar lavage was collected, with 90cc sterile saline instilled and approximately 35cc of cloudy bronchoalveolar lavage fluid returned.  The patient tolerated the procedure well without complications.        The pictures above shows the left upper lobe apicoposterior and anterior segments are narrowed, with endobronchial lesions. In the picture on the left, the lingula is seen in the corner of the picture.        Left lower lobe with copious purulent secretions.    COMPLICATIONS:  None    EBL < 5 CC    SPECIMENS:  1. Bronchoalveolar lavage 35 cc was sent for cytology and micro studies    Dr. Madaline Guthrie was present for and supervised the entire procedure.    Newt Lukes, MD  02/26/2017, 2:13 PM  [facility]        Attending attestation:  I agree with the procedure note above. I was present during the entire procedure. I was supervising the fellow.      Emeline Gins, MD  Interventional Pulmonology  Critical Care Medicine  Pager: [phone]

## 2017-02-26 NOTE — Procedures
PULMONARY PROCEDURE NOTE    DATE OF PROCEDURE: 02/26/2017    TYPE OF PROCEDURE:  1. Flexible video bronchoscopy.    ATTENDING PHYSICIAN:  Emeline Gins, MD    ASSISTING PHYSICIAN: Newt Lukes, MD    PRE-PROCEDURE DIAGNOSIS / INDICATION  1. Worsened hypoxemic respiratory failure, acute after replacement of ETT  2. Persistent ETT leak  3. Placement of ETT  4. Concern for mucous plugging    BRONCHOSCOPIC FINDINGS:  1. ETT was in the appropriate position  2. Right side was open and clear of mucous plugs  3. LLL with persistent mucous plugging, s/p therapeutic suctioning    MEDICATIONS / SEDATION:  2mg  versed IV    DESCRIPTION OF PROCEDURE:  After discussion of risks, benefits, and alternatives of the procedure, informed consent was obtained from the patient who agreed to proceed. This was a flexible bronchoscopy for evaluation of acute desaturation after ETT with cuff leak s/p exchange of ETT and persistent hypoxemia, concerning for mucous plugging vs tube malposition.   A time out was performed prior to initiation of the procedure. The patient received sedation with the aforementioned medications, and the bronchoscope was inserted into the ETT and into the trachea.      All lobar segments and sub-segments in bilateral right and left lungs were visualized. There were persistent copious purulent secretions in the left lower lobe s/p therapeutic suctioning. The ETT was in appropriate position. The patient tolerated the procedure well without complications.    COMPLICATIONS:  None    EBL < 5 CC    SPECIMENS:  N/A    Dr. Madaline Guthrie was present for and supervised the entire procedure.    Newt Lukes, MD  02/26/2017, 2:27 PM  [facility] Fellow          Attending attestation:  I agree with the procedure note above. I was present during the entire procedure. I was supervising the fellow.      Emeline Gins, MD  Interventional Pulmonology  Critical Care Medicine  Pager: [phone]

## 2017-02-26 NOTE — Procedures
PROCEDURE:   Endotracheal Intubation.     INDICATIONS:   Hypoxemic, hypercapneic respiratory failure    PROCEDURE ATTENDING:  Emeline Gins, MD    MEDS:  Etomidate 30 mg   Rocuronium 100mg     PROCEDURE SUMMARY:   Permit was implied secondary to emergent situation. A MAC 4 blade was inserted into the oropharynx at which time the vocal cords were visualized. A size 8.0 endotracheal tube was inserted and visualized going through the vocal cords. The stylette was removed. Colorimetric change was visualized on the CO2 meter. Breath sounds were heard in both lung fields equally. The endotracheal tube was placed at 26 cm, measured at the upper lip (no teeth in place, edentulous).   A post-intubation chest x-ray was ordered to verify endotracheal tube position and patient also underwent immediate bronchoscopy (see separate note).      Notable findings:  There was a large leak from ETT balloon despite inflating cuff to pressures of 60cm H20 so the plan was to exchange the ETT.    COMPLICATIONS: None apparent  EBL <5 cc    Dr. Madaline Guthrie was present for and supervised the entire procedure.    Newt Lukes, MD  Pulmonary and Critical Care Medicine Fellow  Pager [phone]          Attending attestation:  I agree with the procedure note above. I was present during the entire procedure. I was supervising the fellow.      Emeline Gins, MD  Interventional Pulmonology  Critical Care Medicine  Pager: [phone]

## 2017-02-26 NOTE — Other
Patients Clinical Goal:   Clinical Goal(s) for the Shift: 1) Maintain hemodynamic stability MAP>65 2) Maintain adequate oxygenation SPO2>88% wean FIO2 if possible 3)  Up to chair today  4)maintain the comfort and safety of the pt  Identify possible barriers to advancing the care plan: Respiratory status  Stability of the patient: Moderately Unstable - medium risk of patient condition declining or worsening    End of Shift Summary:   1)Pt continues in NSR ith occassional PVC an PACs  SBP >65 today pt afebrile today\  2)Pt on High flow 60% 40L  Pt began the shift with sats 92-94% . Pt received lasix and encouraged to utilize Incentie spirometer. SPO2 sats decreased to 87-89% this afternoon.  Pt  Given lasix sats improved  3)Pt remained on bedrest due to resp status  4) pt medicated x 2 for back pain.

## 2017-02-26 NOTE — Progress Notes
.  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  IE/SUBJECTIVE:   IE:  -NAEO    Subjective:  -Feels well with improved WOB. Eating well. Stable cough with occasional sputum production (stable over past 2 months). Denies SOB, CP, F/chills, N/V, diarrhea, dysuria.    OBJECTIVE:   VITALS:  Temp:  [36.3 ???C (97.3 ???F)-36.8 ???C (98.2 ???F)] 36.8 ???C (98.2 ???F)  Heart Rate:  [80-102] 89  Resp:  [13-22] 13  BP: (96-176)/(63-154) 145/84  NBP Mean:  [75-161] 102  FiO2 (%):  [60 %-65 %] 60 %  SpO2:  [86 %-93 %] 92 %  12/29 0701 - 12/30 0700  In: 1380 [P.O.:630; I.V.:150]  Out: 2650 [Urine:2650]     Wt Readings from Last 1 Encounters:   02/25/17 (!) 103.1 kg (227 lb 4.7 oz)     Recent Labs      02/25/17   2325  02/25/17   1208  02/25/17   0751  02/24/17   2153   GLUCOSEPOC  141*  153*  152*  105*   }  Oxygen Therapy  SpO2: 92 %  O2 Device: High flow nasal cannula  FiO2 (%): 60 %  O2 Flow Rate (L/min): 40 L/min     FiO2 (%):  [60 %-65 %] 60 %    PHYSICAL EXAM:  General - Well-appearing, NAD  Neuro - A&Ox4, no FND  HEENT - EOMI  Cardiovascular - RRR no m/r/g, unable to assess JVP d/t girth, 2+ radial pulses b/l  Lungs - Normal WOB, HFNC on, no wheezing. Much improved from prior.  Skin - No visible rashes or lesions  Abdomen - Soft, NT/ND  Extremeties - Warm and well-perfused. Trace to no pre-tibial edema. 1+ pitting edema over ankles b/l, improved    LABORATORY DATA:     Results for orders placed or performed during the hospital encounter of 2017/03/23   CBC   Result Value Ref Range    White Blood Cell Count 7.74 4.16 - 9.95 x10E3/uL Red Blood Cell Count 3.80 (L) 4.41 - 5.95 x10E6/uL    Hemoglobin 10.8 (L) 13.5 - 17.1 g/dL    Hematocrit 78.4 (L) 38.5 - 52.0 %    Mean Corpuscular Volume 87.4 79.3 - 98.6 fL    Mean Corpuscular Hemoglobin 28.4 26.4 - 33.4 pg    MCH Concentration 32.5 31.5 - 35.5 g/dL    Red Cell Distribution Width-SD 43.8 36.9 - 48.3 fL    Red Cell Distribution Width-CV 13.8 11.1 - 15.5 %    Platelet Count, Auto 169 143 - 398 x10E3/uL    Mean Platelet Volume 9.8 9.3 - 13.0 fL    Nucleated RBC%, automated 0.0 No Ref. Range %    Absolute Nucleated RBC Count 0.00 0.00 - 0.00 x10E3/uL    Neutrophil Abs (Prelim) 5.09 See Absolute Neut Ct. x10E3/uL   Differential, Automated   Result Value Ref Range    Neutrophil Percent, Auto 65.8 No Ref. Range %    Lymphocyte Percent, Auto 15.8 No Ref.  Range %    Monocyte Percent, Auto 14.7 No Ref. Range %    Eosinophil Percent, Auto 0.8 No Ref. Range %    Basophil Percent, Auto 0.6 No Ref. Range %    Immature Granulocytes% 2.3 No Reference Range %    Absolute Neut Count 5.09 1.80 - 6.90 x10E3/uL    Absolute Lymphocyte Count 1.22 (L) 1.30 - 3.40 x10E3/uL    Absolute Mono Count 1.14 (H) 0.20 - 0.80 x10E3/uL    Absolute Eos Count 0.06 0.00 - 0.50 x10E3/uL    Absolute Baso Count 0.05 0.00 - 0.10 x10E3/uL    Absolute Immature Gran Count 0.18 (H) 0.00 - 0.04 x10E3/uL       ABG:   Recent Labs      02/24/17   0816   PHART  7.40   PCO2ART  44*   PO2ART  70*   BICARBART  26.7*   BEART  2     VBG: VBG  Recent Labs      02/25/17   1449  02/25/17   1044  02/25/17   0344  02/24/17   2154  02/24/17   1606  02/24/17   1312  02/24/17   0303  03/11/2017   2239   PHVEN  7.34  7.37  7.39  7.39  7.41  7.38  7.37  7.35   PCO2VEN  58  54  50  53  47  49  50  56   PO2VEN  39  35  46  24  34  28  36  30   BICARBVEN  30.4  30.6  29.9  31.4  29.0  28.4  27.8  30.5       Micro:    Blood Cx x 2 (12/28): NGTD   Respiratory cx (12/28): Few probable candida albicans  RVP (12/28): WNL    IMAGING: CXR (12/30: Per primary team read, unchanged from 12/29 CXR (see below)    CXR (12/29):  IMPRESSION:  Unchanged enlarged cardiomediastinal silhouette from combination of mediastinal adenopathy and left hilar mass extending into upper lobe, and lower lobe with left lymphangitic carcinomatosis. Unchanged moderate left pleural effusion.      ASSESSMENT & PLAN:   Ryan Shepard???is a 76 y.o.???male???with a history of COPD (on 3LNC), HFpEF, afib, HTN, DM2, PAD s/p femoral graft, prior prostate CA who was admitted for hypoxemic respiratory failure requiring BiPAP.  ???  RESP:  #Acute on chronic hypoxemic respiratory failure  Admitted from clinic requiring BiPAP in setting of decreased arterial PaO2 and desats to low 80s. Recently admitted w/ bronch showing chronic colonization with aspergillus. Has features c/f malignancy on imaging.  - Wean off BiPAP to HFNC as tolerated, goal SpO2 88-92%  - Serial VBGs  - Malignancy work-up as outpatient, as below  - Empiric IV Vanc/Zosyn, azithromycin, as below  - CTM BPs, asymmetric in arms but low c/f aortic pathology given absence of CP  ???  #Chronic COPD  Increased sputum production x 2 months, slight wheezing and recently treated with steroids while hospitalized. Current presentation less c/w COPD exacerbation.  - Space duonebs from q4 to q6h  - Inhaled Flovent BID  - Empiric abx, as below  ???  ID:  #Leukocytosis  Afebrile. Increased sputum production x 2 months otherwise no localizing signs of infection. Did receive course of methylprednisolone 12/18-12/23 on recent hospitalization. 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  - ID consulted, appreciate recs  - RVP negative  - F/u final  sputum cultures, AFB culture from 12/13, blood cultures  - F/u urine histoplasma Ag (sendout lab), hold antifungal tx at this time  - Continue IV Vanc/Zosyn, azithromycin  ???  CV:  #HFpEF  Home dose lasix PO 40mg  BID  - IV lasix PRN to maintain goal net -1L - BID lytes, daily weights, strict I&O's  - F/u TTE (ordered)  ???  #Afib  Rate-controlled. CHADS2VaSC 3-4  - Hold home carvedilol 25 mg BID  - Hold home apixaban 5 mg BID  ???  HEME:  #Hypervascular Liver Lesions/Mediastinal LAD- c/f malignancy, hx prostate cancer treated with total prostatectomy.  - Outpatient work-up for malignancy. Current presentation more amenable to IR biopsy of liver lesions, EBUS less given location of lung lesions and prior complications s/p bronchoscopy d/t poor respiratory status  ???  CHRONIC  #HTN  - Restart home nifedipine 30mg  daily  - Hold other home BP meds: losartan 100 mg daily, carvedilol 25 mg BID  ???  #Chronic???pain   Localized to lower back and lower extremities  - Hold home Norco 5-325 q6h PRN  - Tylenol for mild/mod pain PRN, oxycodone for severe pain PRN, lidocaine patch  ???  #GERD chronic/stable  - Start pantoprazole PO 40 mg daily 12/31  ???  #Depression/Anxiety  Mood stable, no SI, no anxiety  - Home buspirone 15 mg BID, duloxetine DR 60  ???  #PAD  s/p femoral-femoral graft 2009.  - Holding clopidogrel 75 mg daily in anticipation of biopsy to work up LAD/Liver lesions, however may occur as outpatient so plan to restart 12/31  ???  #HLD chronic/stable  - Pravastatin 40 mg qhs  ???  #DM2 with associated diabetic peripheral neuropathy  - SSI#2  - Gabapentin 800 mg TID   ???  F - Diabetic/low sodium diet  A - PRN Oxy/Tylenol for chronic pain (on Norco PRN at home)  S - None  T - SQ lovenox (in setting of malignancy),  H - 30 degrees  U - PPI  G - Accuchecks q6hrs, SSI#2  B - Senna qhs PRN  I - PIV 22g R hand (12/28- ), PIV 20g L antecub (12/28- )  ???  Disposition: ICU level care required for hypoxemic respiratory failure requiring BiPAP  ???  Code Status: Full Code  Contact:  Primary Emergency ContactTarrance, Januszewski, Home Phone: [phone]    Discussed with attending: Christiane Ha., MD    Jess Barters, PGY-1  [phone]

## 2017-02-26 NOTE — Progress Notes
INFECTIOUS DISEASES CONSULTATION    Patient: Ryan Shepard  MRN: [id number]  DOB: 1940-10-11  Date of Service: 02/26/2017  Requesting Physician: Christiane Ha., MD  Reason for Consultation: R/O Aspergillus Pneumonia    Chief Complaint: Hypoxia    History of Present Illness:   Ryan Shepard is a 76 y/o M with COPD, tobacco use disorder, PAD s/p femoral graft, HTN, DM2, HLD, Afib, GERD, depression, anxiety, chronic pain readmitted on 12/28 following discharge to home on 12/26 with recurrence of dyspnea.     Prior hospital course as follows:  12/6: Admitted with a COPD exacerbation in the setting of diagnosed COPD and extensive tobacco use disorder. CXR on admission showing confluent airspace opacities c/f multifocal aspiration/pneumonia. Procal neg. Treated with levaquin (12/6- ) x 7 days and 5 day course of prednisone.   12/11: Patient was planned for discharge but then was found on bacterial resp culture from admission (12/6) to have aspergillus species, few probably candida. Other micro including legionella urine ag neg, MRSA nares neg, HIV neg. Had CT chest done showing airspace and nodular consolidation within the left upper lobe and lingula and lesser within the right upper and right lower lobes consistent with pneumonia, likely fungal. Also with bulky intrathoracic multicompartamental lymphadenopathy. 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 Started on acyclovir 800 mg BID x 5 days. Gonorrhea/chlamydia neg. RPR pending. Bacterial culture no growth to date. HIV neg.   12/12: VSS, no other acute changes. Requiring 3 L NC. No changes in respiratory status.   12/13: VSS. No acute changes. Pulmonology consulted, recommended bronch,  performed same day. Pt states his SOB has improved drastically. No fevers, chills, nausea, vomiting. No new culture data. 12/14 Events noted; the patient became increasingly hypoxic after the bronchoscopy despite using a 15 liter face mask.  The blood also noted hypercapnia.  According to the notes the patient reported worsening cough associated blood tinged sputum, but today he claims there was no change in his breath or cough.  He was transferred to ICU for further monitoring and started on voriconazole, piperacillin-tazobactam and vancomycin.  He denied fever, chills chest pain or changes in his breathing.  12/15: no acute events. Remains afebrile with normal WBC. Reports he is coughing more today but breathing feels better.   12/16: Transferred to the floor, afebrile  12/23: Completed 14 day course of antibiotic treatment (Vanco/Zosyn/Vori x 3 days; Ceftriaxone/Flagyl x 6 days; Vanco/Zosyn x 5 days)  12/26 Discharged to home  12/28 Seen in Medicine clinic for routine follow-up, noted to have O2Sat in low 70's on 4L NC, readmitted to hospital with BiPap treatment, Vanco/Zosyn/Azithromycin restarted    Interval Events:  12/31: No acute events overnight, on Bipap this AM, continued productive cough, no growth to date on cultures from this admission      Hospital Course (Key Events): Date of Admission 2017/03/19    Pertinent antimicrobial courses:  As above  Vancomycin (12/28-  Zosyn (12/28-  Azithromycin 12/28-    Review of Systems:  A 14-point review of systems was performed and is negative except for as noted above.     Past Medical History:  Past Medical History:   Diagnosis Date   ??? Cancer (HCC/RAF)    ??? Diabetes (HCC/RAF)    ??? GERD (gastroesophageal reflux disease)    ??? Hypercholesteremia    ??? Hypertension    ??? PAD (  peripheral artery disease) (HCC/RAF)    ??? 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 Prostate disease    ??? Scoliosis     adolesent   ??? Vascular disease         Past Surgical History:  Past Surgical History:   Procedure Laterality Date   ??? BACK SURGERY ??? BILATERAL FEMORAL ARTERY EXPLORATION  2009   ??? BLADDER SURGERY  1999   ??? PROSTATE SURGERY  2000       Allergies:   No Known Allergies    Prior to Admission Medications:  Prescriptions Prior to Admission   Medication Sig Dispense Refill Last Dose   ??? albuterol (2.5 mg/57mL) 0.083% nebulizer solution Inhale contents of 1 ampulr (48mL=2.5 mg total) by nebulization every four (4) hours as needed. 75 mL 3 Taking at Unknown time   ??? albuterol 90 mcg/act inhaler Inhale 2 puffs every six (6) hours as needed. 18 g 0 Taking at Unknown time   ??? allopurinol 100 mg tablet Take 1 tablet (100 mg total) by mouth daily. 30 tablet 0 Taking at Unknown time   ??? apixaban 5 mg tablet Take 1 tablet (5 mg total) by mouth two (2) times daily. 60 tablet 0 Taking at Unknown time   ??? busPIRone 15 mg tablet Take 1 tablet (15 mg total) by mouth two (2) times daily. 60 tablet 0 Taking at Unknown time   ??? carvedilol 25 mg tablet Take 1 tablet (25 mg total) by mouth two (2) times daily. 60 tablet 0 Taking at Unknown time   ??? DULoxetine 60 mg DR capsule Take 1 capsule (60 mg total) by mouth daily. 30 capsule 0 Taking at Unknown time   ??? Fluticasone-Umeclidin-Vilant (TRELEGY ELLIPTA) 100-62.5-25 MCG/INH AEPB Inhale 1 puff daily. 60 each 0 Taking at Unknown time   ??? furosemide 40 mg tablet Take 1 tablet (40 mg total) by mouth two (2) times daily. 60 tablet 0 Taking at Unknown time   ??? gabapentin 400 mg capsule Take 2 capsules (800 mg total) by mouth three (3) times daily. 180 capsule 0 Taking at Unknown time   ??? hydrocodone-acetaminophen (NORCO) 10-325 mg tablet Take 1 tablet by mouth every four (4) to six (6) hours .  0 Taking at Unknown time   ??? linagliptin-metFORMIN (JENTADUETO) 2.06-998 MG TABS Take 1 tablet by mouth two (2) times daily. 60 tablet 0 Taking at Unknown time   ??? LORazepam 2 mg tablet Take 2 mg by mouth every four (4) hours as needed.   Taking at Unknown time   ??? losartan 100 mg tablet Take 1 tablet (100 mg total) by mouth daily. 30 tablet 0 Taking at Unknown time   ??? NIFEdipine (ADALAT CC) 30 mg 24 hr tablet Take 1 tablet (30 mg total) by mouth daily. 30 tablet 0 Taking at Unknown time   ??? pantoprazole 40 mg DR tablet Take 1 tablet (40 mg total) by mouth at bedtime. 30 tablet 0 Taking at Unknown time   ??? pravastatin 40 mg tablet Take 1 tablet (40 mg total) by mouth at bedtime. 30 tablet 0 Taking at Unknown time   ??? senna 8.6 mg tablet Take 1 tablet by mouth at bedtime as needed for Constipation. 30 tablet 0 Taking at Unknown time   ??? Spacer/Aero-Holding Chambers (AEROCHAMBER PLUS FLO-VU) MISC Use as directed for inhalation 1 device 0 Taking at Unknown time       Medications:  Scheduled Meds:  ???  albuterol  2.5 mg Nebulization Q6H   ??? azithromycin  500 mg Intravenous Q24H   ??? busPIRone  15 mg Oral BID   ??? chlorhexidine  10 mL Mouth/Throat BID   ??? DULoxetine  60 mg Oral Daily   ??? etomidate  0.3 mg/kg (Dosing Weight) IV Push Once   ??? fentaNYL (PF)  25 mcg IV Push Once   ??? fluticasone  1 puff Inhalation BID   ??? gabapentin  800 mg Oral TID   ??? ipratropium  0.5 mg Nebulization Q6H   ??? lidocaine  2 patch Transdermal Q24H   ??? lidocaine PF  20 mL Endotracheal Once   ??? midazolam  0.5 mg IV Push Once   ??? NIFEdipine  30 mg Oral Q24H   ??? pantoprazole  40 mg Oral Daily   ??? piperacillin/tazobactam  3.375 g Intravenous Q8H   ??? pravastatin  40 mg Oral QHS   ??? propofol  50 mg IV Push Once   ??? rocuronium  100 mg IV Push Once   ??? sodium chloride  1,000 mL Intravenous Once   ??? vancomycin  1.25 g Intravenous Q12H     Continuous Infusions:  ??? dexmedetomidine 4 mcg/mL drip     ??? fentaNYL 2.5 mg/100 mL drip     ??? phenylephrine drip 10 mg/250 mL (peripheral)     ??? sodium chloride 5 mL/hr (02/26/17 0041)   ??? sodium chloride 5 mL/hr (02/26/17 0216)   ??? sodium chloride       PRN Meds:.acetaminophen, insulin aspart **AND** dextrose, oxyCODONE, senna    Family History:  Family History   Problem Relation Age of Onset   ??? Cancer Mother    ??? Cancer Father ??? Malignant hyperthermia Neg Hx      No relevant family history of infectious diseases.    Social History:  Social History     Social History   ??? Marital status: Married     Spouse name: N/A   ??? Number of children: N/A   ??? Years of education: N/A     Social History Main Topics   ??? Smoking status: Never Smoker   ??? Smokeless tobacco: Never Used   ??? Alcohol use 0.0 oz/week   ??? Drug use: No   ??? Sexual activity: Not Asked     Other Topics Concern   ??? None     Social History Narrative   ??? None     Lives in [place], originally from [place] (moved to [place] >40 years ago)    Physical Exam:  Temp:  [36.1 ???C (97 ???F)-37.1 ???C (98.8 ???F)] 36.4 ???C (97.5 ???F)  Heart Rate:  [86-102] 93  Resp:  [11-22] 17  BP: (118-176)/(72-154) 120/87  NBP Mean:  [86-161] 98  FiO2 (%):  [60 %-70 %] 70 %  SpO2:  [86 %-92 %] 89 %  Temp (24hrs), Avg:36.7 ???C (98 ???F), Min:36.1 ???C (97 ???F), Max:37.1 ???C (98.8 ???F)    Intake and Output:   Last Two Completed Shifts:  I/O last 2 completed shifts:  In: 2001.5 [P.O.:1215; I.V.:186.5; IV Piggyback:600]  Out: 2650 [Urine:2650]  Vitals:    02/26/17 0600   Weight: 99.4 kg (219 lb 2.2 oz)       General: no acute distress, on Bipap  Eyes: anicteric sclerae, no injection, extraocular movements are intact  ENT: no oropharyngeal lesions, mmm, normal dentition  Neck: supple, no masses  Lymph: no cervical, supraclavicular, or axillary adenopathy   CV: Regular rate and  rhythm. Normal S1 and S2. No murmurs, rubs, or gallops. 2+ distal pulses.   Pulm: Normal effort. Diffuse rhonchi and crackles B/L, R>L  GI: Soft, nondistended, and nontender. Normoactive bowel sounds. No guarding or rebound.  Musculoskeletal: No clubbing, cyanosis, or edema.   Skin: no visible rash, normal turgor  Neuro: Awake and alert, moving all 4 extremities. Grossly nonfocal exam.   Psych: Appropriate mood and affect. Normal judgment and insight.    Labs:  Lab Results   Component Value Date    WBC 7.34 02/26/2017    HGB 12.7 (L) 02/26/2017 HCT 38.6 02/26/2017    MCV 89.6 02/26/2017    PLT 195 02/26/2017     Lab Results   Component Value Date    CREAT 0.98 02/26/2017    BUN 10 02/26/2017    NA 135 02/26/2017    K 4.0 02/26/2017    CL 92 (L) 02/26/2017    CO2 28 02/26/2017     Lab Results   Component Value Date    ALT 18 02/08/2017    AST 20 02/08/2017    ALKPHOS 50 02/08/2017    BILITOT 0.4 02/08/2017       Microbiology:   12/28 Resp Cx NTD  12/28 Blood Cx NTD  12/28 RVP Neg  12/18 Resp Cx Neg (C albicans)  12/13 BAL Cx Neg  12/13 respiratory culture: candida, therwise negative so far; AFB smear negative x 2 (AFB cultures NTD)  12/13 legionella culture neg  Aspergillus Ag EIA -  <0.50  RVP panel negative  Aspergillus fumigatus (M3) IgE - NEG   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  HIV  - neg  Legionella Urinary Ag - neg  Chlamydia/gonorrhea PCR - neg  RPR nonreactive    Imaging Reviewed by Me:   12/28 CXR  Unchanged enlarged cardiomediastinal silhouette from combination of mediastinal adenopathy and left hilar mass extending into upper lobe, and lower lobe with left lymphangitic carcinomatosis. Unchanged moderate left pleural effusion.    12/20 CT Chest  1.   Redemonstration of coalescent airspace and nodular consolidation, most prominent in the left upper lobe and lingula with peripheral groundglass associated with Unchanged to slightly more prominent mediastinal and bilateral hilar lymphadenopathy.   This finding is nonspecific but concerning for malignancy versus pneumonia with etiologies including fungal infection.   2.  Increase in size of pleural and pericardial effusions and anasarca consistent with volume overload.  3.   Dilated main pulmonary artery measuring 39 mm reflecting pulmonary arterial hypertension. 4.  Severe coronary artery calcifications.    12/20 CT A/P  ???  History of prostate cancer treated with total prostatectomy with at least 4 hypervascular hepatic masses measuring up to 5 cm in the right hepatic lobe, suspicious for metastatic disease. No abdominal or pelvic lymphadenopathy.  Severe atherosclerotic disease with thrombosed left common, external, and femoral artery, and patent femorofemoral bypass.    Assessment:   76 yo M with history of COPD, Prostate CA with possible metastatic disease, recent admission for dyspnea treated with 14 days antibiotics, now readmitted with hypoxia but pt comfortable on exam. Hypoxia on supplemental O2, diffuse nodular opacities in lungs, continue production of purulent sputum. Agree with empiric antibiotic treatment while awaiting results of cultures. Although concern for  possible aspergillus pneumonia is reasonable in this patient with persistent lung disease and imaging consistent with fungal infection, only A [place] (not a common cause of pulmonary disease) has been isolated on a single expectorated sputum sample, with no growth on multiple other respiratory cultures, including a BAL culture, and negative galactomannan assay (while on zosyn). Recurrent hypoxia likely secondary to a persistent or recurrent bacterial infection secondary to obstructive pulmonary process (COPD +/- lung metastases).      Problem List:   1) COPD  2) Prostate Cancer with possible metastatic disease  3) Hypoxia  4) Bacterial pneumonia (unspecified)    - Precautions: No infectious isolation    Recommendations/Plan:   1) Continue Zosyn/Azithromycin, D/C Vancomycin as no evidence of resistant gram positive organisms  2) Follow up resp cx (including AFB cx from 12/13)  3) Follow up urine histoplasma Ag, continue to hold antifungal treatment  4) Bipap and supplemental oxygenation per ICU team    Thank you for this consultation. We will continue to follow with you. Please page [id number] (General ID) with any questions.    Recommendations communicated to ICU team.    Author:  Riccardo Dubin. Chestine Spore, MD 02/26/2017 12:56 PM

## 2017-02-26 NOTE — Procedures
PROCEDURE:   Endotracheal tube exchange over a bougie.     INDICATIONS:   Persistent ETT cuff leak, concern for torn ETT balloon    PROCEDURE ATTENDING:  Francesco Runner Shathri    MEDS:  Propofol 30mg     PROCEDURE SUMMARY:   Permit was implied secondary to emergent situation. A bougie was inserted into the existing ETT and the ETT was removed, after which a new 8.0 French ETT was advanced over the bougie and the cuff inflated. The new ETT still had a persistent cuff leak, and there was difficulty bagging the patient and saturations dropped, therefore an emergent repeat bronchoscopy was performed (see separate note). The bougie was removed. Colorimetric change was visualized on the CO2 meter. Breath sounds were heard in both lung fields. The endotracheal tube was placed at 25 cm, measured at the upper lip.  A post-intubation chest x-ray was ordered to verify endotracheal tube position.    Notable findings:  Continued ETT leak despite new tube placement    COMPLICATIONS: None apparent  EBL <5 cc    Dr. Madaline Guthrie was present for and supervised the entire procedure    Newt Lukes, MD  Pulmonary and Critical Care Medicine Fellow  Pager [phone]

## 2017-02-27 ENCOUNTER — Ambulatory Visit: Payer: PRIVATE HEALTH INSURANCE

## 2017-02-27 ENCOUNTER — Ambulatory Visit: Payer: MEDICARE

## 2017-02-27 ENCOUNTER — Ambulatory Visit: Payer: Commercial Managed Care - HMO

## 2017-02-27 DIAGNOSIS — J9611 Chronic respiratory failure with hypoxia: Secondary | ICD-10-CM

## 2017-02-27 LAB — Creatinine,Random Urine: CREATININE,RANDOM URINE: 245.6 mg/dL

## 2017-02-27 LAB — Basic Metabolic Panel
CALCIUM: 8.9 mg/dL (ref 8.6–10.4)
CALCIUM: 8.6 mg/dL (ref 8.6–10.4)
CALCIUM: 8.6 mg/dL (ref 8.6–10.4)
CHLORIDE: 92 mmol/L — ABNORMAL LOW (ref 96–106)
SODIUM: 132 mmol/L — ABNORMAL LOW (ref 135–146)

## 2017-02-27 LAB — B-Type Natriuretic Peptide: BNP: 36 pg/mL (ref <100)

## 2017-02-27 LAB — Blood Gases, arterial
ABG INSPIRED O2: 80 mmol/L (ref ?–2)
BICARBONATE: 26.5 mmol/L — ABNORMAL HIGH (ref 22.0–26.0)
PH: 7.39 mmHg (ref 7.37–7.41)
PO2: 92 mmHg — ABNORMAL LOW (ref 98–118)

## 2017-02-27 LAB — Glucose,POC
GLUCOSE,POC: 117 mg/dL — ABNORMAL HIGH (ref 65–99)
GLUCOSE,POC: 140 mg/dL — ABNORMAL HIGH (ref 65–99)
GLUCOSE,POC: 164 mg/dL — ABNORMAL HIGH (ref 65–99)

## 2017-02-27 LAB — Pneumocystis Direct Detection: PNEUMOCYSTIS DIRECT DETECTION: NEGATIVE

## 2017-02-27 LAB — Fungal Stain: FUNGAL STAIN_FIRST: NONE SEEN

## 2017-02-27 LAB — Bacterial Culture Respiratory: BACTERIAL CULTURE RESPIRATORY: NEGATIVE

## 2017-02-27 LAB — Acid-Fast Culture and Stain, Respiratory: ACID FAST CULTURE: NEGATIVE

## 2017-02-27 LAB — Sodium,Random,Ur: SODIUM,RANDOM URINE: 24 mmol/L

## 2017-02-27 LAB — Mycobacterium tuberculosis PCR: MYCOBACTERIUM TUBERCULOSIS PCR: NOT DETECTED

## 2017-02-27 LAB — Troponin I: TROPONIN INTERPRETATION: NEGATIVE ng/mL (ref <0.1)

## 2017-02-27 LAB — Fungal Culture Respiratory: FUNGAL CULTURE RESPIRATORY: NEGATIVE

## 2017-02-27 LAB — Blood Lactate: BLOOD LACTATE: 31 mg/dL — ABNORMAL HIGH (ref 5–25)

## 2017-02-27 LAB — HSV Type 1 and 2 by PCR: HSV TYPE 2 DNA: NOT DETECTED

## 2017-02-27 LAB — Urea Nitrogen,Random Urine: UREA NITROGEN,RANDOM URINE: 198 mg/dL

## 2017-02-27 LAB — APTT: APTT: 39.7 s — ABNORMAL HIGH (ref 24.4–36.2)

## 2017-02-27 LAB — CBC: NUCLEATED RBC%, AUTOMATED: 0 (ref 13.5–17.1)

## 2017-02-27 LAB — Gram Stain Respiratory

## 2017-02-27 LAB — Magnesium: MAGNESIUM: 2.4 meq/L — ABNORMAL HIGH (ref 1.4–1.9)

## 2017-02-27 MED ADMIN — PROPOFOL INFUSION 10 MG/ML: 7755 ug/min | INTRAVENOUS | @ 23:00:00 | Stop: 2017-03-14

## 2017-02-27 MED ADMIN — LIDOCAINE HCL (PF) 1 % IJ SOLN: 5 mL | INTRADERMAL | @ 03:00:00 | Stop: 2017-02-27

## 2017-02-27 MED ADMIN — DEXMEDETOMIDINE 4 MCG/ML DRIP: 20.68 ug/h | INTRAVENOUS | @ 05:00:00 | Stop: 2017-02-27

## 2017-02-27 MED ADMIN — FENTANYL 2.5 MG/100 ML NS DRIP: 50 ug/h | INTRAVENOUS | @ 10:00:00 | Stop: 2017-03-13 | NDC 00409909461

## 2017-02-27 MED ADMIN — LACTATED RINGERS IV BOLUS: 500 mL | INTRAVENOUS | @ 18:00:00 | Stop: 2017-02-27 | NDC 00338011704

## 2017-02-27 MED ADMIN — CHLORHEXIDINE GLUCONATE 0.12 % MT SOLN: 10 mL | OROMUCOSAL | @ 03:00:00 | Stop: 2017-03-13 | NDC 52376002110

## 2017-02-27 MED ADMIN — GABAPENTIN 400 MG PO CAPS: 800 mg | ORAL | @ 14:00:00 | Stop: 2017-02-28

## 2017-02-27 MED ADMIN — PHENYLEPHRINE 10 MG/250 ML DRIP: 40200 ug/min | INTRAVENOUS | @ 20:00:00 | Stop: 2017-03-01

## 2017-02-27 MED ADMIN — PHENYLEPHRINE 10 MG/250 ML DRIP: 40200 ug/min | INTRAVENOUS | Stop: 2017-03-01

## 2017-02-27 MED ADMIN — FENTANYL 2.5 MG/100 ML NS DRIP: 50 ug/h | INTRAVENOUS | @ 23:00:00 | Stop: 2017-03-13

## 2017-02-27 MED ADMIN — BUSPIRONE HCL 5 MG PO TABS: 15 mg | ORAL | @ 05:00:00 | Stop: 2017-02-28

## 2017-02-27 MED ADMIN — DEXMEDETOMIDINE 4 MCG/ML DRIP: 20.68 ug/h | INTRAVENOUS | @ 07:00:00 | Stop: 2017-02-27 | NDC 55150020902

## 2017-02-27 MED ADMIN — MAGNESIUM SULFATE 4 GM/100ML IV SOLN: 4 g | INTRAVENOUS | @ 03:00:00 | Stop: 2017-02-27 | NDC 63323010601

## 2017-02-27 MED ADMIN — AZITHROMYCIN IVPB 250 ML: 500 mg | INTRAVENOUS | @ 22:00:00 | Stop: 2017-02-28 | NDC 63323039810

## 2017-02-27 MED ADMIN — IPRATROPIUM BROMIDE 0.02 % IN SOLN: 500 ug | RESPIRATORY_TRACT | @ 03:00:00 | Stop: 2017-03-07 | NDC 00487980101

## 2017-02-27 MED ADMIN — POTASSIUM CHLORIDE 20 MEQ/15ML (10%) PO SOLN: 20 meq | OROGASTRIC | @ 02:00:00 | Stop: 2017-02-27 | NDC 66689004750

## 2017-02-27 MED ADMIN — PIPERACILLIN-TAZOBACTAM 3.375 GM/50 ML RTU (EXT 4HR): 3.375 g | INTRAVENOUS | @ 18:00:00 | Stop: 2017-03-02 | NDC 00206886102

## 2017-02-27 MED ADMIN — DULOXETINE HCL 60 MG PO CPEP: 60 mg | ORAL | @ 18:00:00 | Stop: 2017-02-28

## 2017-02-27 MED ADMIN — PHENYLEPHRINE 10 MG/250 ML DRIP: 40200 ug/min | INTRAVENOUS | @ 02:00:00 | Stop: 2017-03-01

## 2017-02-27 MED ADMIN — GABAPENTIN 400 MG PO CAPS: 800 mg | ORAL | @ 20:00:00 | Stop: 2017-02-28

## 2017-02-27 MED ADMIN — GABAPENTIN 400 MG PO CAPS: 800 mg | ORAL | @ 05:00:00 | Stop: 2017-02-28 | NDC 68084077401

## 2017-02-27 MED ADMIN — FLUTICASONE PROPIONATE HFA 220 MCG/ACT IN AERO: 1 | RESPIRATORY_TRACT | @ 08:00:00 | Stop: 2017-03-02

## 2017-02-27 MED ADMIN — LIDOCAINE HCL (PF) 1 % IJ SOLN: 5 mL | INTRADERMAL | @ 19:00:00 | Stop: 2017-02-27 | NDC 00143959525

## 2017-02-27 MED ADMIN — ALBUTEROL SULFATE (5 MG/ML) 0.5% IN NEBU: 2.5 mg | RESPIRATORY_TRACT | @ 20:00:00 | Stop: 2017-03-07 | NDC 00487990130

## 2017-02-27 MED ADMIN — DEXMEDETOMIDINE 4 MCG/ML DRIP: 20.68 ug/h | INTRAVENOUS | @ 01:00:00 | Stop: 2017-02-27 | NDC 55150020902

## 2017-02-27 MED ADMIN — IPRATROPIUM BROMIDE 0.02 % IN SOLN: 500 ug | RESPIRATORY_TRACT | @ 20:00:00 | Stop: 2017-03-07 | NDC 00487980101

## 2017-02-27 MED ADMIN — LIDOCAINE 5 % EX PTCH: 2 | TRANSDERMAL | @ 08:00:00 | Stop: 2017-02-28 | NDC 00591352530

## 2017-02-27 MED ADMIN — LIDOCAINE 5 % EX PTCH: 2 | TRANSDERMAL | @ 18:00:00 | Stop: 2017-02-28

## 2017-02-27 MED ADMIN — PHENYLEPHRINE 10 MG/250 ML DRIP: 40200 ug/min | INTRAVENOUS | @ 03:00:00 | Stop: 2017-03-01 | NDC 76014000410

## 2017-02-27 MED ADMIN — DEXMEDETOMIDINE 4 MCG/ML DRIP: 20.68 ug/h | INTRAVENOUS | @ 13:00:00 | Stop: 2017-02-27 | NDC 55150020902

## 2017-02-27 MED ADMIN — DEXMEDETOMIDINE 4 MCG/ML DRIP: 20.68 ug/h | INTRAVENOUS | @ 20:00:00 | Stop: 2017-02-27 | NDC 55150020902

## 2017-02-27 MED ADMIN — ALBUTEROL SULFATE (5 MG/ML) 0.5% IN NEBU: 2.5 mg | RESPIRATORY_TRACT | @ 08:00:00 | Stop: 2017-03-07 | NDC 00487990130

## 2017-02-27 MED ADMIN — BUSPIRONE HCL 5 MG PO TABS: 15 mg | ORAL | @ 18:00:00 | Stop: 2017-02-28

## 2017-02-27 MED ADMIN — PHENYLEPHRINE 10 MG/250 ML DRIP: 40200 ug/min | INTRAVENOUS | @ 14:00:00 | Stop: 2017-03-01 | NDC 76014000410

## 2017-02-27 MED ADMIN — BUSPIRONE HCL 5 MG PO TABS: 15 mg | ORAL | @ 05:00:00 | Stop: 2017-02-28 | NDC 16729020001

## 2017-02-27 MED ADMIN — HEPARIN SODIUM (PORCINE) 1000 UNIT/ML IJ SOLN: 7340 [IU] | INTRAVENOUS | @ 20:00:00 | Stop: 2017-02-27 | NDC 63323054011

## 2017-02-27 MED ADMIN — PRAVASTATIN SODIUM 20 MG PO TABS: 40 mg | ORAL | @ 05:00:00 | Stop: 2017-02-28

## 2017-02-27 MED ADMIN — CHLORHEXIDINE GLUCONATE 0.12 % MT SOLN: 10 mL | OROMUCOSAL | @ 18:00:00 | Stop: 2017-03-13 | NDC 52376002110

## 2017-02-27 MED ADMIN — PANTOPRAZOLE SODIUM 40 MG PO TBEC: 40 mg | ORAL | @ 18:00:00 | Stop: 2017-02-27

## 2017-02-27 MED ADMIN — PHENYLEPHRINE 10 MG/250 ML DRIP: 40200 ug/min | INTRAVENOUS | @ 10:00:00 | Stop: 2017-03-01 | NDC 76014000410

## 2017-02-27 MED ADMIN — PHENYLEPHRINE 10 MG/250 ML DRIP: 40200 ug/min | INTRAVENOUS | @ 02:00:00 | Stop: 2017-03-01 | NDC 76014000410

## 2017-02-27 MED ADMIN — IPRATROPIUM BROMIDE 0.02 % IN SOLN: 500 ug | RESPIRATORY_TRACT | @ 08:00:00 | Stop: 2017-03-07 | NDC 00487980101

## 2017-02-27 MED ADMIN — NOREPINEPHRINE 8, 16, 32 MG/250 ML DRIP: 10.34103.4 ug/min | INTRAVENOUS | @ 20:00:00 | Stop: 2017-03-02 | NDC 36000016210

## 2017-02-27 MED ADMIN — HEPARIN SODIUM (PORCINE) 5000 UNIT/ML IJ SOLN: 5000 [IU] | SUBCUTANEOUS | @ 19:00:00 | Stop: 2017-02-27

## 2017-02-27 MED ADMIN — SODIUM CHLORIDE 0.9 % IV SOLN: 50 mL/h | INTRAVENOUS | @ 03:00:00 | Stop: 2017-02-27

## 2017-02-27 MED ADMIN — PIPERACILLIN-TAZOBACTAM 3.375 GM/50 ML RTU (EXT 4HR): 3.375 g | INTRAVENOUS | @ 03:00:00 | Stop: 2017-03-02 | NDC 00206886102

## 2017-02-27 MED ADMIN — FLUTICASONE PROPIONATE HFA 220 MCG/ACT IN AERO: 1 | RESPIRATORY_TRACT | @ 20:00:00 | Stop: 2017-03-02

## 2017-02-27 MED ADMIN — PROPOFOL INFUSION 10 MG/ML: 7755 ug/min | INTRAVENOUS | @ 14:00:00 | Stop: 2017-03-14 | NDC 63323026965

## 2017-02-27 MED ADMIN — IPRATROPIUM BROMIDE 0.02 % IN SOLN: 500 ug | RESPIRATORY_TRACT | @ 14:00:00 | Stop: 2017-03-07 | NDC 00487980101

## 2017-02-27 MED ADMIN — PHENYLEPHRINE 10 MG/250 ML DRIP: 40200 ug/min | INTRAVENOUS | @ 07:00:00 | Stop: 2017-03-01 | NDC 76014000410

## 2017-02-27 MED ADMIN — HEPARIN 25,000 UNITS/250 ML 0.45% NACL RTU: 1800 [IU]/h | INTRAVENOUS | @ 20:00:00 | Stop: 2017-02-28 | NDC 00409765062

## 2017-02-27 MED ADMIN — ALBUTEROL SULFATE (5 MG/ML) 0.5% IN NEBU: 2.5 mg | RESPIRATORY_TRACT | @ 03:00:00 | Stop: 2017-03-07 | NDC 00487990130

## 2017-02-27 MED ADMIN — PHENYLEPHRINE 10 MG/250 ML DRIP: 40200 ug/min | INTRAVENOUS | @ 05:00:00 | Stop: 2017-03-01 | NDC 76014000410

## 2017-02-27 MED ADMIN — PRAVASTATIN SODIUM 20 MG PO TABS: 40 mg | ORAL | @ 05:00:00 | Stop: 2017-02-28 | NDC 51079045820

## 2017-02-27 MED ADMIN — CHLORHEXIDINE GLUCONATE 0.12 % MT SOLN: 10 mL | OROMUCOSAL | @ 05:00:00 | Stop: 2017-03-13 | NDC 52376002110

## 2017-02-27 MED ADMIN — IOHEXOL 350 MG/ML IV SOLN: 100 mL | INTRAVENOUS | @ 22:00:00 | Stop: 2017-02-27 | NDC 00407141491

## 2017-02-27 MED ADMIN — PIPERACILLIN-TAZOBACTAM 3.375 GM/50 ML RTU (EXT 4HR): 3.375 g | INTRAVENOUS | @ 09:00:00 | Stop: 2017-03-02 | NDC 00206886102

## 2017-02-27 MED ADMIN — GABAPENTIN 400 MG PO CAPS: 800 mg | ORAL | @ 05:00:00 | Stop: 2017-02-28

## 2017-02-27 MED ADMIN — ALBUTEROL SULFATE (5 MG/ML) 0.5% IN NEBU: 2.5 mg | RESPIRATORY_TRACT | @ 14:00:00 | Stop: 2017-03-07 | NDC 00487990130

## 2017-02-27 NOTE — Nursing Note
0700: Received report from [place], Charity fundraiser. Initial assessments performed. On assessment, air leak sound noted from ETT, RT at bedside to assess, SpO2 maintained at >90% on 80% FiO2.   0830: MD rounds. Per Attending, plan to place central line, possible ETT exchange. RT at bedside to advance ETT tube per MD order. CXR at bedside, during CXR patient awake, agitated to RASS +2, per MD increase Propofol to max dose and titrate Phenylephrine to 150 mcg/min pending central line placement. Notified MD re: a-line positional, waveform not appropriate, leveled zeroed calirate. On assessment a-line catheter kinked. Arterial line re-dressed and sutures removed with Kathalene Frames, RN. MD Jasuja at bedside to suture arterial catheter in place. Waveform leveled, zeroed, calibrated, appropriate. NBP measurement taken, MD Jasuja aware that a-line and NBP measurement do not correlate. Per MD, continue to monitor.  0900: MD Lynnell Chad, MD [place]am at bedside to perform [place] for central line placement. During [place], C/f BLE DVT, bedside TTE performed by MD Lynnell Chad, per MD noted hyperdynamic RVF, MD Al-Shathri notified and aware. Patient desaturating and SpO2 90-91% on 100% FiO2, c/f PE with RHS per MD. Plan to initiate Heparin gtt and administer heparin bolus, CT-abdomen+chest ordered. See MAR.   1914: MD Jamb[place]am notified re: accucheck 164. Per MD, OK to hold corrective insulin d/t pt strict NPO status. Verified with MD Lynnell Chad. Accucheck changed to Q4H.   1130: MD Jasuja at bedside to update wife of current POC. All questions and concerns answered appropriately.  1200: MD Jasuja at bedside for central line placement. Time out performed, VS WNL. Tolerated well with no acute events.  1300: Transport for [employer]. MD Lynnell Chad present for transport. CT abdomen completed wo incident. Noted improved SpO2 on increased vT, RT to notify MD. See orders.  1345: Return to ICU wo incident.  1400: MD Al-Shathri at bedside to update wife of current POC. 1500: MD Al-Shathri, MD Lynnell Chad, and MD Jamb[place]am, and RT at bedside for ETT exchange. Plan to upsize ETT to 8.5. Time out performed prior to procedure. ETT exchange successful, however during exchange noted moderate amount green secretions to ETT, c/f aspiration. Patient open bag suction x2 by MD Al-Shathri and RT, SpO2 80-85%. CXR+KUB completed, plan for bronchoscopy + lavage.  1540: Time out for bedside bronchoscopy and lavage, tolerated well wo incident.  1637: MD Jamb[place]am notified re: accucheck 175. Per MD, OK to hold corrective insulin d/t pt NPO. Accucheck continued at [facility].   1745: MD Jambulingam notified re: aPTT >180sec. Heparin gtt stopped per protocol. Plan to d/c Heparin gtt.  1900: Bedside report and care endorsed to [place], RN.

## 2017-02-27 NOTE — Procedures
PATIENT:  Ryan Shepard  MRN:  [id number]  DOB:  May 05, 1940  DATE OF PROCEDURE:  02/27/2017   TIME OF PROCEDURE:    2:25 PM    Procedure: Central Line Insertion     Informed Consent Discussion:     Informed consent for this procedure is included in a general consent for treatment.    Procedure:     INDICATIONS:  administration of intravenous medications (pressors)  ATTENDING PHYSICIAN: Laretta Bolster, MD     A time-out was performed, verifying the patient, site and procedure. The patient was placed in the supine position. The overlying skin was prepped using chlorhexidine scrubs and full wide barrier precaution drapes were applied in sterile fashion. Local anesthesia was obtained with 1% lidocaine.     Using ultrasound guidance, the  Left Femoral central vein was visualized and accessed with a 16-guauge introducer needle, with return of dark, non-pulsatile blood. A guidewire was advanced through the catheter needle and its position in the vein was confirmed by ultrasound visualization.  A small incision was made with an 11-blade scalpel and the dilator was advanced over the guidewire until appropriate dilation was obtained. The dilator was removed and a 7 [place] central venous triple lumen catheter was advanced over the guidewire. The guidewire and needle were removed and the catheter was secured into place with 2.0 silk sutures at 20 cm.  At the time of procedure completion, all ports returned dark blood and were subsequently flushed with normal saline.       Post procedure:      COMPLICATION(S): None apparent  CONDITION: Patient tolerated the procedure well    Line was okay'd for use given femoral location.    Laretta Bolster, MD  Pulmonary and Critical Care Medicine Fellow  Pager [phone]          Attending attestation:  I agree with the procedure note above. I was present during the entire procedure. I was supervising the fellow.      Gershon Cull, MD  Interventional Pulmonology  Critical Care Medicine  Pager: [phone]

## 2017-02-27 NOTE — Other
Patients Clinical Goal:   Clinical Goal(s) for the Shift: 1. Monitor hemodynamic stability,2. Monitor respiratory status, 3. Maintain comfrot and safety.  Identify possible barriers to advancing the care plan: respiratory distress, hypotension  Stability of the patient: Moderately Unstable - medium risk of patient condition declining or worsening    End of Shift Summary:   1. Phenylephrine went up to 100, titrated to 51mcg/min. Arterial MAP keeps greater than 65.  2. ETT cuff leak severely after reposition, SpO2 down to 75-76%. MD and RT at bedside, FiO2 went up to 100%, adjusted to 80% after got ABG result.   3. Patient remains supine as MD requested for temporarily then change position with wedge every 2 hours and prn. Comfort and safety maintained.

## 2017-02-27 NOTE — Procedures
PROCEDURE:   Endotracheal Tube Exchange over a bougie.     INDICATIONS:   Persistent ETT cuff leak with desaturation    PROCEDURE ATTENDING:  Emeline Gins, MD    MEDS:  Propofol 40mg  IV  Fentanyl IV    PROCEDURE SUMMARY:   Permit was implied secondary to emergent situation. A bougie was inserted into the existing 8.0 ETT to approximately 30cm at which time the old ETT was removed over the bougie and a new 8.5 French ETT was inserted over the bougie. The bougie was removed, the cuff inflated but patient still had a cuff leak, therefore cuff was filled to a pressure of 50cm H20. There were bilateral breath sounds, color change, fogging of the tube and end tidal CO2 was detected.     A post-intubation chest x-ray was ordered to verify endotracheal tube position.    COMPLICATIONS: None apparent    Dr Madaline Guthrie was present for and supervised the entire procedure.    Newt Lukes, MD  Pulmonary and Critical Care Medicine Fellow  Pager [phone]              Attending attestation:  I agree with the procedure note above. I was present during the entire procedure. I was supervising the fellow.      Emeline Gins, MD  Interventional Pulmonology  Critical Care Medicine  Pager: [phone]

## 2017-02-27 NOTE — Other
Patients Clinical Goal:   Clinical Goal(s) for the Shift: 1) maintain hemodynamic stability MAP>65 2) Maintain oxygenation SPO28892% 3) Monoitor I/O 4) maintain comfort and safety of thee pt  Identify possible barriers to advancing the care plan: Reparatory status  Stability of the patient: Unstable - high likelihood or risk of patient condition declining or worsening; patient condition declining or worsening    End of Shift Summary:   1) Pt continues in NSR with PVCs and PACs  Pt placed on phenylephrine during intubation for maintance of SBP currently is at 80 mcg  2) Pt intubated today with size 8 tube and exchanged out for another 8 ET due to leak.   Pt bronched x2 as well Pt intubated at this [place] 450;25;10; 60%  3)Pt received lasix x 2 today . Foley placed  40 Pt sedated currently on precedex 0.3 mcg; fentanyl 15mcg;propofol 16mcg as well he appears comfortable

## 2017-02-27 NOTE — Nursing Note
1600: Covering for primary [employer]. MD at bedside place A-line right radial, time out, a-line zero, blood draw back, ABG sent.    1630: ABG sent with BMP    Ref. Range 02/26/2017 15:19   pH Latest Ref Range: No Reference Range  7.31   pCO2 Latest Ref Range: No Reference Range mm Hg 60   pO2 Latest Ref Range: No Reference Range mm Hg 48   Bicarbonate Latest Ref Range: No Reference Range mmol/L 29.5   Base Excess Latest Ref Range: No Reference Range mmol/L 2   O2 Sat/Measured Latest Ref Range: No Reference Range % 78.0   Inspired O2 Unknown 100     1654: Phenylephrine drip turn off and pt continue to be on propofol drip, fentanyl drip and started on precedex drip

## 2017-02-27 NOTE — Progress Notes
NUTRITION IN-DEPTH SCREEN (Adult)    Admit Date: 02/21/2017     Date of Birth: 18-May-1940 Gender: male MRN: [id number]     Date of Screening 02/27/2017   Subjective: Nutrition Screening: Attempted to visit 02/26/17- Staff in room, patient being intubated. Chart reviewed   Problems: Active Problems:    Dyspnea POA: Yes    Acute hypoxemic respiratory failure (HCC/RAF) POA: Yes       Past Medical History:   Diagnosis Date   ??? Cancer (HCC/RAF)    ??? Diabetes (HCC/RAF)    ??? GERD (gastroesophageal reflux disease)    ??? Hypercholesteremia    ??? Hypertension    ??? PAD (peripheral artery disease) (HCC/RAF)    ??? 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 Prostate disease    ??? Scoliosis     adolesent   ??? Vascular disease     Past Surgical History:   Procedure Laterality Date   ??? BACK SURGERY     ??? BILATERAL FEMORAL ARTERY EXPLORATION  2009   ??? BLADDER SURGERY  1999   ??? PROSTATE SURGERY  2000         Anthropometrics     Admit  Height: 185.4 cm (6' 0.99'')  Weight: 97.1 kg (214 lb) Most Recent  Height: 185.4 cm (6' 0.99'')  Weight: (!) 100.9 kg (222 lb 7.1 oz)  IBW: 83.5 kg (184 lb)  Usual Weight:  (n/a-patient intubated)           BMI (Calculated): 29.4    % Ideal Body Weight: 121 %           Allergies   Patient has no known allergies.     Cultural / Religious / Ethnic Food Preferences   Unable to Assess       Nutrition Prior to Admission   patient intubated, unable to interview.      Nutrition Risk Factors   Moderate Nutrition Risk Factors: Chronic Obstructive Pulmonary Disease (COPD), Diabetes- stable  High Nutrition Risk Factors: Respiratory failure on ventilator  Acuity Level: 3-High risk     Diet Orders     Diets/Supplements/Feeds   Diet    Diet NPO     Start Date/Time: 02/26/17 1430     Number of Occurrences:  Until Specified     Order Comments: Upcoming Surgeries and Procedures  BRONCHOSCOPY FLEXIBLE Date:02/26/2017 Time:1255        Additional data from Physician note: Patient is a 77 y.o.???male???with a history of COPD (on 4LNC), HFpEF, afib, HTN, DM2, PAD s/p femoral graft, prior prostate CA who was admitted to the ICU for hypoxemic respiratory failure requiring BiPAP.     Impression   PO % consumed: NPO/patient intubated. Weight > IBW.   Impression: Pt currently NPO     Diet Education   No diet education needs at this time      FDI Target Drugs: No          Nutrition Care Plan   Plan: Refer to RD 2/2 respiratory failure on ventilator.       Next Follow-up by 02/28/17 (screened 1/1-refer to RD)    Author:  Cyndi Bender, DTR, pager [phone]  02/27/2017 8:50 AM

## 2017-02-27 NOTE — Progress Notes
.  MICU PROGRESS NOTE    PATIENT: Ryan Shepard       MRN: [id number]      DATE OF SERVICE: 02/27/2017         HOSPITAL DAY: 4  PRIMARY CARE PROVIDER: Sheppard Coil, MD  ATTENDING: Christiane Ha., MD    ID: Ryan Shepard???is a 77 y.o.???male???with a history of COPD (on 4LNC), HFpEF, afib, HTN, DM2, PAD s/p femoral graft, prior prostate CA who was admitted to the ICU for hypoxemic respiratory failure requiring BiPAP.  IE/SUBJECTIVE:   IE:  - A-line placed overnight  - persistent cuff leak with desats and worsening VBGs  - low MAPS <65 on phenylephrine, central line placed and norepi started this AM    Subjective:   - intubated and sedated. Unable to assess    OBJECTIVE:   VITALS:  Temp:  [35.8 ???C (96.4 ???F)-36.4 ???C (97.5 ???F)] 35.8 ???C (96.4 ???F)  Heart Rate:  [72-118] 75  Resp:  [0-25] 25  BP: (73-154)/(58-94) 78/65  NBP Mean:  [64-110] 71  Arterial Line BP (mmHg): (73-115)/(52-90) 81/57  MAP:  [63 mmHg-96 mmHg] 67 mmHg  FiO2 (%):  [60 %-100 %] 80 %  SpO2:  [81 %-99 %] 91 %  12/31 0701 - 01/01 0700  In: 2462.7 [I.V.:2112.7]  Out: 2055 [Urine:2005]     Wt Readings from Last 1 Encounters:   02/27/17 (!) 100.9 kg (222 lb 7.1 oz)     Recent Labs      02/27/17   0410  02/26/17   2021  02/26/17   1525  02/26/17   0829   GLUCOSEPOC  140*  117*  187*  130*   }  Oxygen Therapy  SpO2: 91 %  O2 Device: ETT, Mechanical Ventilator  FiO2 (%): 80 %  O2 Flow Rate (L/min): 40 L/min     Vent Mode: VC  FiO2 (%):  [60 %-100 %] 80 %  Rate:  [16-25] 25  VT (mL):  [450 mL] 450 mL  PEEP (setting):  [8 cmH2O-10 cmH2O] 10 cmH2O    PHYSICAL EXAM:  General - Intubated/Sedated  Neuro - Sedated, +gag  HEENT - PERRLA  Cardiovascular - RRR no m/r/g, unable to assess JVP d/t girth, 2+ radial pulses b/l  Lungs - Audible cuff leak while intubated, midl wheezing anteriorly  Skin - No visible rashes or lesions  Abdomen - Central obesity, soft, NT/ND  Extremeties - Warm and well-perfused. Trace to no pre-tibial edema. 1+ pitting edema over ankles b/l, improved    LABORATORY DATA:     Results for orders placed or performed during the hospital encounter of 2017-03-15   CBC   Result Value Ref Range    White Blood Cell Count 7.74 4.16 - 9.95 x10E3/uL    Red Blood Cell Count 3.80 (L) 4.41 - 5.95 x10E6/uL    Hemoglobin 10.8 (L) 13.5 - 17.1 g/dL    Hematocrit 45.4 (L) 38.5 - 52.0 %    Mean Corpuscular Volume 87.4 79.3 - 98.6 fL    Mean Corpuscular Hemoglobin 28.4 26.4 - 33.4 pg    MCH Concentration 32.5 31.5 - 35.5 g/dL    Red Cell Distribution Width-SD 43.8 36.9 - 48.3 fL    Red Cell Distribution Width-CV 13.8 11.1 - 15.5 %    Platelet Count, Auto 169 143 - 398 x10E3/uL    Mean Platelet Volume 9.8 9.3 - 13.0 fL    Nucleated RBC%, automated 0.0 No Ref. Range %  Absolute Nucleated RBC Count 0.00 0.00 - 0.00 x10E3/uL    Neutrophil Abs (Prelim) 5.09 See Absolute Neut Ct. x10E3/uL   Differential, Automated   Result Value Ref Range    Neutrophil Percent, Auto 65.8 No Ref. Range %    Lymphocyte Percent, Auto 15.8 No Ref. Range %    Monocyte Percent, Auto 14.7 No Ref. Range %    Eosinophil Percent, Auto 0.8 No Ref. Range %    Basophil Percent, Auto 0.6 No Ref. Range %    Immature Granulocytes% 2.3 No Reference Range %    Absolute Neut Count 5.09 1.80 - 6.90 x10E3/uL    Absolute Lymphocyte Count 1.22 (L) 1.30 - 3.40 x10E3/uL    Absolute Mono Count 1.14 (H) 0.20 - 0.80 x10E3/uL    Absolute Eos Count 0.06 0.00 - 0.50 x10E3/uL    Absolute Baso Count 0.05 0.00 - 0.10 x10E3/uL    Absolute Immature Gran Count 0.18 (H) 0.00 - 0.04 x10E3/uL       ABG:   Recent Labs      02/27/17   0407   PHART  7.41   PCO2ART  43*   PO2ART  66*   BICARBART  26.8*   BEART  2     VBG: VBG  Recent Labs      02/26/17   1519  02/26/17   0555  02/25/17   1449  02/25/17   1044  02/25/17   0344  02/24/17   2154  02/24/17   1606  02/24/17   1312   PHVEN  7.31  7.33  7.34  7.37  7.39  7.39  7.41  7.38   PCO2VEN  60  64  58  54  50  53  47  49 PO2VEN  48  24  39  35  46  24  34  28   BICARBVEN  29.5  32.8  30.4  30.6  29.9  31.4  29.0  28.4       Micro:    Blood Cx x 2 (12/28): NGTD   Respiratory cx (12/28): Few probable candida albicans  RVP (12/28): WNL    IMAGING:     CTA chest (1/1):   Prelim read by on-call radiology resident, no obvious     TTE (12/28):  CONCLUSIONS:   1. Small LV size, moderate septal hypertrophy, normal wall motion and hyperdynamic systolic function.   2. Left ventricular ejection fraction is approximately >75%.   3. Mild LV diastolic dysfunction (grade I, impaired relaxation).   4. Normal right ventricular size and normal systolic function. (TAPSE 2.6 cm, DTI 27.0 cm/s).   5. Mild tricuspid valve regurgitation with estimated pulmonary artery systolic pressure of 51 mm Hg + CVP/JVP (IVC assessment inaccurate in setting of mechanical ventilation).   6. Small anterior pericardial effusion, measuring up to 8 mm. No echocardiographic evidence of tamponade.   7. Compared to TTE performed 02/01/2017, no significant changes noted with LV and RV function. Pericardial effusion is new.    CXR (12/30: Per primary team read, unchanged from 12/29 CXR (see below)    CXR (12/29):  IMPRESSION:  Unchanged enlarged cardiomediastinal silhouette from combination of mediastinal adenopathy and left hilar mass extending into upper lobe, and lower lobe with left lymphangitic carcinomatosis. Unchanged moderate left pleural effusion.      ASSESSMENT & PLAN:   Ryan Shepard???is a 77 y.o.???male???with a history of COPD (on 3LNC), HFpEF, afib, HTN, DM2, PAD s/p femoral graft, prior prostate CA  who was admitted for hypoxemic respiratory failure requiring BiPAP.  ???  CV:  #Shock in setting of sedation  Patient requires significant sedation d/t air leak on cuff of ETT, patient changing position can increase air leak. Hypotension requiring pressors in setting of sedation but elevated lactate and signs of end-organ damage including rising Cr. Patient has leukocytosis and on empiric abx, no other signs of infection.  - A-line placed 12/31 overnight for accurate BP monitoring but poor wave form. Rely on MAPS on cuff  - R fem 3-lumen CVC placed 1/1  - D/c phenylephrine  - Start levophed   - Goal MAPS>90 on cuff  - Continue to trend lactate    #HFpEF  Home dose lasix PO 40mg  BID  - Given that patient appears volume down, hold IV lasix  - BID lytes, daily weights, strict I&O's  - TTE shows hyperdynamic EF >75% and elevated PASP to  ???  #Afib  Rate-controlled. CHADS2VaSC 3-4  - Hold home carvedilol 25 mg BID  - Hold home apixaban 5 mg BID    NEURO:  #Intubated and sedated 2/2 hypoxemic respiratory failure  - Cont sedation with fentanyl/dex    RESP:  #Acute on chronic hypoxemic respiratory failure  Admitted from clinic requiring BiPAP in setting of decreased arterial PaO2 and desats to low 80s. Recently admitted w/ bronch showing chronic colonization with aspergillus. Has features c/f malignancy on imaging. Respiratory status continues to worsen with worsened xray.   - d/t persistent cuff leak, upsized ETT to 8.5 today  - Serial ABGs  - Malignancy work-up as outpatient, as below  - Empiric IV Vanc/Zosyn, azithromycin, as below  - Initial c/f PE, CTA prelim read no PE. Trops negative and TTE without RV strain. For now, continue heparin gtt and f/u final CTA read. LE dopplers negative for DVT   - Trend trops to peak  - Plan for thora of L pleural effusion on 02/28/17  ???  #Chronic COPD  Increased sputum production x 2 months, slight wheezing and recently treated with steroids while hospitalized. Current presentation less c/w COPD exacerbation.  - Duonebs q6h  - Inhaled Flovent BID  - Empiric abx, as below  ???  ID:  #Leukocytosis  Afebrile. Increased sputum production x 2 months otherwise no localizing signs of infection. Did receive course of methylprednisolone 12/18-12/23 on recent hospitalization. 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  - ID consulted, appreciate recs  - RVP negative  - F/u final sputum cultures, AFB culture from 12/13, blood cultures  - F/u urine histoplasma Ag (sendout lab), hold antifungal tx at this time  - Continue IV Vanc/Zosyn, azithromycin  ???  HEME:  #Hypervascular Liver Lesions/Mediastinal LAD- c/f malignancy, hx prostate cancer treated with total prostatectomy.  - Outpatient work-up for malignancy. Current presentation more amenable to IR biopsy of liver lesions, EBUS less given location of lung lesions and prior complications s/p bronchoscopy d/t poor respiratory status  ???  CHRONIC  #HTN  - Hold home BP meds in setting of shock: nifedipine 30mg  daily, losartan 100 mg daily, carvedilol 25 mg BID  ???  #Chronic???pain   Localized to lower back and lower extremities  - Hold home Norco 5-325 q6h PRN  - Tylenol for mild/mod pain PRN, oxycodone for severe pain PRN, lidocaine patch  ???  #GERD chronic/stable  - Pantoprazole PO 40 mg daily 12/31  ???  #Depression/Anxiety  Mood stable, no SI, no anxiety  - Home buspirone 15 mg BID, duloxetine DR 60  ???  #  PAD  s/p femoral-femoral graft 2009.  - Holding clopidogrel 75 mg daily  ???  #HLD chronic/stable  - Pravastatin 40 mg qhs  ???  #DM2 with associated diabetic peripheral neuropathy  - Hold insulin while NPO  - Gabapentin 800 mg TID   ???  F - npo  A - Sedated  S - Fent/dex  T - heparin gtt  H - 30 degrees  U - PPI  G - NPO  B - Senna qhs PRN  I - PIV 22g R hand (12/28- ), PIV 20g L antecub (12/28- ), CVC 3-Lumen R fem (1/1- ), A-line 12/31 (12/31- )  ???  Disposition: ICU level care required for hypoxemic respiratory failure requiring intubation  ???  Code Status: Full Code  Contact:  Primary Emergency Contact: Edell,Johnnie, Home Phone: [phone]    Discussed with attending: Christiane Ha., MD    Jess Barters, PGY-1  [phone]          ATTENDING ADDENDUM: I have seen and examined patient and discussed the plan of care with the residents/fellow. I have personally reviewed all studies and labs listed above. The assessment and plan were formulated together with the housestaff, and I am in agreement with exam, assessment and treatment plan as stated in this document.   Critical care time: 70 minutes, excluding procedures      Emeline Gins, MD  Interventional Pulmonology  Critical Care Medicine  Pager: [phone]

## 2017-02-27 DEATH — deceased

## 2017-02-28 ENCOUNTER — Ambulatory Visit: Payer: PRIVATE HEALTH INSURANCE

## 2017-02-28 DIAGNOSIS — A419 Sepsis, unspecified organism: Secondary | ICD-10-CM

## 2017-02-28 DIAGNOSIS — J80 Acute respiratory distress syndrome: Secondary | ICD-10-CM

## 2017-02-28 DIAGNOSIS — J189 Pneumonia, unspecified organism: Secondary | ICD-10-CM

## 2017-02-28 DIAGNOSIS — J9601 Acute respiratory failure with hypoxia: Secondary | ICD-10-CM

## 2017-02-28 DIAGNOSIS — R6521 Severe sepsis with septic shock: Secondary | ICD-10-CM

## 2017-02-28 DIAGNOSIS — C61 Malignant neoplasm of prostate: Secondary | ICD-10-CM

## 2017-02-28 LAB — Blood Gases, arterial
ABG INSPIRED O2: 75 mmol/L (ref 22.0–26.0)
ABG INSPIRED O2: 75 mmol/L (ref 7.37–7.41)
BASE EXCESS: 0 mmol/L (ref ?–2)
BASE EXCESS: -2 mmol/L (ref ?–2)
BICARBONATE: 23.8 mmol/L (ref 22.0–26.0)
BICARBONATE: 23.3 mmol/L (ref 22.0–26.0)
PCO2: 41 mmHg (ref 38–42)
PO2: 69 mmHg — ABNORMAL LOW (ref 98–118)

## 2017-02-28 LAB — Glucose,POC
GLUCOSE,POC: 175 mg/dL — ABNORMAL HIGH (ref 65–99)
GLUCOSE,POC: 147 mg/dL — ABNORMAL HIGH (ref 65–99)
GLUCOSE,POC: 149 mg/dL — ABNORMAL HIGH (ref 65–99)
GLUCOSE,POC: 148 mg/dL — ABNORMAL HIGH (ref 65–99)
GLUCOSE,POC: 146 mg/dL — ABNORMAL HIGH (ref 65–99)
GLUCOSE,POC: 212 mg/dL — ABNORMAL HIGH (ref 65–99)

## 2017-02-28 LAB — Magnesium
MAGNESIUM: 2.1 meq/L — ABNORMAL HIGH (ref 1.4–1.9)
MAGNESIUM: 2.1 meq/L — ABNORMAL HIGH (ref 1.4–1.9)

## 2017-02-28 LAB — APTT
APTT: >180 s — CR (ref 24.4–36.2)
APTT: 41.8 s — ABNORMAL HIGH (ref 24.4–36.2)
APTT: 42.5 s — ABNORMAL HIGH (ref 24.4–36.2)

## 2017-02-28 LAB — CBC
MEAN PLATELET VOLUME: 9.1 fL — ABNORMAL LOW (ref 9.3–13.0)
RED CELL DISTRIBUTION WIDTH-SD: 42.8 fL (ref 36.9–48.3)

## 2017-02-28 LAB — Blood Lactate
BLOOD LACTATE: 31 mg/dL — ABNORMAL HIGH (ref 5–25)
BLOOD LACTATE: 30 mg/dL — ABNORMAL HIGH (ref 5–25)
BLOOD LACTATE: 31 mg/dL — ABNORMAL HIGH (ref 5–25)
BLOOD LACTATE: 30 mg/dL — ABNORMAL HIGH (ref 5–25)
BLOOD LACTATE: 28 mg/dL — ABNORMAL HIGH (ref 5–25)
BLOOD LACTATE: 28 mg/dL — ABNORMAL HIGH (ref 5–25)

## 2017-02-28 LAB — Basic Metabolic Panel
CALCIUM: 8.3 mg/dL — ABNORMAL LOW (ref 8.6–10.4)
CHLORIDE: 90 mmol/L — ABNORMAL LOW (ref 96–106)
GFR ESTIMATE FOR NON-AFRICAN AMERICAN: 48 mmol/L (ref 135–146)
UREA NITROGEN: 18 mg/dL (ref 7–22)

## 2017-02-28 LAB — Mycoplasma pneumoniae DNA PCR: MYCOPLASMA PNEUMONIAE DNA PCR: NOT DETECTED

## 2017-02-28 LAB — Troponin I: TROPONIN I: <0.04 ng/mL (ref <0.1)

## 2017-02-28 LAB — Blood Culture Detection
BLOOD CULTURE FINAL STATUS: NEGATIVE
BLOOD CULTURE FINAL STATUS: NEGATIVE

## 2017-02-28 LAB — Legionella Culture: LEGIONELLA CULTURE: NEGATIVE

## 2017-02-28 LAB — Bacterial Culture Respiratory: BACTERIAL CULTURE RESPIRATORY: NEGATIVE

## 2017-02-28 MED ADMIN — IPRATROPIUM BROMIDE 0.02 % IN SOLN: 500 ug | RESPIRATORY_TRACT | @ 14:00:00 | Stop: 2017-03-07 | NDC 00487980101

## 2017-02-28 MED ADMIN — GABAPENTIN 400 MG PO CAPS: 800 mg | ORAL | @ 05:00:00 | Stop: 2017-02-28

## 2017-02-28 MED ADMIN — GABAPENTIN 400 MG PO CAPS: 800 mg | ORAL | @ 14:00:00 | Stop: 2017-02-28

## 2017-02-28 MED ADMIN — CHLORHEXIDINE GLUCONATE 0.12 % MT SOLN: 10 mL | OROMUCOSAL | @ 17:00:00 | Stop: 2017-03-13 | NDC 52376002110

## 2017-02-28 MED ADMIN — DULOXETINE HCL 60 MG PO CPEP: 60 mg | ORAL | @ 21:00:00 | Stop: 2017-02-28

## 2017-02-28 MED ADMIN — ALBUTEROL SULFATE (5 MG/ML) 0.5% IN NEBU: 2.5 mg | RESPIRATORY_TRACT | @ 03:00:00 | Stop: 2017-03-07 | NDC 00487990130

## 2017-02-28 MED ADMIN — INSULIN ASPART 100 UNIT/ML SC SOPN: 3 mL | SUBCUTANEOUS | @ 20:00:00 | Stop: 2017-03-03 | NDC 00169633910

## 2017-02-28 MED ADMIN — PROPOFOL INFUSION 10 MG/ML: 7755 ug/min | INTRAVENOUS | @ 20:00:00 | Stop: 2017-03-14 | NDC 63323026965

## 2017-02-28 MED ADMIN — ALBUTEROL SULFATE (5 MG/ML) 0.5% IN NEBU: 2.5 mg | RESPIRATORY_TRACT | @ 20:00:00 | Stop: 2017-03-07 | NDC 00487990130

## 2017-02-28 MED ADMIN — NOREPINEPHRINE 8, 16, 32 MG/250 ML DRIP: 10.34103.4 ug/min | INTRAVENOUS | @ 06:00:00 | Stop: 2017-03-02 | NDC 36000016210

## 2017-02-28 MED ADMIN — AZITHROMYCIN IVPB 250 ML: 500 mg | INTRAVENOUS | @ 19:00:00 | Stop: 2017-02-28 | NDC 63323039810

## 2017-02-28 MED ADMIN — CHLORHEXIDINE GLUCONATE 0.12 % MT SOLN: 10 mL | OROMUCOSAL | @ 05:00:00 | Stop: 2017-03-13 | NDC 52376002110

## 2017-02-28 MED ADMIN — HEPARIN SODIUM (PORCINE) 5000 UNIT/ML IJ SOLN: 5000 [IU] | SUBCUTANEOUS | @ 05:00:00 | Stop: 2017-03-01 | NDC 25021040201

## 2017-02-28 MED ADMIN — IPRATROPIUM BROMIDE 0.02 % IN SOLN: 500 ug | RESPIRATORY_TRACT | @ 20:00:00 | Stop: 2017-03-07 | NDC 00487980101

## 2017-02-28 MED ADMIN — ALBUTEROL SULFATE (5 MG/ML) 0.5% IN NEBU: 2.5 mg | RESPIRATORY_TRACT | @ 09:00:00 | Stop: 2017-03-07 | NDC 00487990130

## 2017-02-28 MED ADMIN — HEPARIN SODIUM (PORCINE) 5000 UNIT/ML IJ SOLN: 5000 [IU] | SUBCUTANEOUS | @ 17:00:00 | Stop: 2017-03-01 | NDC 25021040201

## 2017-02-28 MED ADMIN — SODIUM CHLORIDE 0.9 % IV BOLUS: 500 mL | INTRAVENOUS | @ 02:00:00 | Stop: 2017-02-28 | NDC 00338004903

## 2017-02-28 MED ADMIN — BUSPIRONE HCL 5 MG PO TABS: 15 mg | ORAL | @ 05:00:00 | Stop: 2017-02-28

## 2017-02-28 MED ADMIN — BUSPIRONE HCL 5 MG PO TABS: 15 mg | ORAL | @ 21:00:00 | Stop: 2017-02-28

## 2017-02-28 MED ADMIN — ALBUTEROL SULFATE (5 MG/ML) 0.5% IN NEBU: 2.5 mg | RESPIRATORY_TRACT | @ 14:00:00 | Stop: 2017-03-07 | NDC 00487990130

## 2017-02-28 MED ADMIN — FLUTICASONE PROPIONATE HFA 220 MCG/ACT IN AERO: 1 | RESPIRATORY_TRACT | @ 20:00:00 | Stop: 2017-03-02 | NDC 00173072020

## 2017-02-28 MED ADMIN — DEXMEDETOMIDINE 4 MCG/ML DRIP: 20.68 ug/h | INTRAVENOUS | @ 02:00:00 | Stop: 2017-03-04 | NDC 55150020902

## 2017-02-28 MED ADMIN — PIPERACILLIN-TAZOBACTAM 3.375 GM/50 ML RTU (EXT 4HR): 3.375 g | INTRAVENOUS | @ 17:00:00 | Stop: 2017-03-02 | NDC 00206886102

## 2017-02-28 MED ADMIN — FENTANYL 2.5 MG/100 ML NS DRIP: 50 ug/h | INTRAVENOUS | @ 18:00:00 | Stop: 2017-03-13 | NDC 00409909461

## 2017-02-28 MED ADMIN — PROPOFOL INFUSION 10 MG/ML: 7755 ug/min | INTRAVENOUS | @ 14:00:00 | Stop: 2017-03-14 | NDC 63323026965

## 2017-02-28 MED ADMIN — NOREPINEPHRINE 8, 16, 32 MG/250 ML DRIP: 10.34103.4 ug/min | INTRAVENOUS | @ 17:00:00 | Stop: 2017-03-02

## 2017-02-28 MED ADMIN — DEXMEDETOMIDINE 4 MCG/ML DRIP: 20.68 ug/h | INTRAVENOUS | @ 14:00:00 | Stop: 2017-03-04 | NDC 55150020902

## 2017-02-28 MED ADMIN — LIDOCAINE 5 % EX PTCH: 2 | TRANSDERMAL | @ 20:00:00 | Stop: 2017-02-28

## 2017-02-28 MED ADMIN — ALBUMIN HUMAN 25 % IV SOLN: 25 g | INTRAVENOUS | @ 21:00:00 | Stop: 2017-02-28 | NDC 68516521601

## 2017-02-28 MED ADMIN — SODIUM CHLORIDE 0.9 % IV BOLUS: 400 mL | INTRAVENOUS | @ 21:00:00 | Stop: 2017-02-28 | NDC 00338004903

## 2017-02-28 MED ADMIN — IPRATROPIUM BROMIDE 0.02 % IN SOLN: 500 ug | RESPIRATORY_TRACT | @ 03:00:00 | Stop: 2017-03-07 | NDC 00487980101

## 2017-02-28 MED ADMIN — FENTANYL 2.5 MG/100 ML NS DRIP: 50 ug/h | INTRAVENOUS | @ 14:00:00 | Stop: 2017-03-13 | NDC 00409909461

## 2017-02-28 MED ADMIN — VASOPRESSIN 20 UNITS, 40 UNITS/100 ML DRIP: 1.4 [IU]/h | INTRAVENOUS | @ 21:00:00 | Stop: 2017-03-01 | NDC 42023016425

## 2017-02-28 MED ADMIN — PANTOPRAZOLE SODIUM 40 MG IV SOLR: 40 mg | INTRAVENOUS | @ 08:00:00 | Stop: 2017-03-01 | NDC 55150020210

## 2017-02-28 MED ADMIN — LIDOCAINE 5 % EX PTCH: 2 | TRANSDERMAL | @ 08:00:00 | Stop: 2017-02-28 | NDC 00591352530

## 2017-02-28 MED ADMIN — FLUDROCORTISONE ACETATE 0.1 MG PO TABS: .1 mg | ORAL | @ 22:00:00 | Stop: 2017-03-01 | NDC 68084028801

## 2017-02-28 MED ADMIN — CHLORHEXIDINE GLUCONATE 0.12 % MT SOLN: 10 mL | OROMUCOSAL | @ 05:00:00 | Stop: 2017-03-13

## 2017-02-28 MED ADMIN — LIDOCAINE HCL (PF) 1 % IJ SOLN: 30 mL | ENDOTRACHEOPULMONARY | @ 01:00:00 | Stop: 2017-03-01

## 2017-02-28 MED ADMIN — PIPERACILLIN-TAZOBACTAM 3.375 GM/50 ML RTU (EXT 4HR): 3.375 g | INTRAVENOUS | Stop: 2017-03-02 | NDC 00206886102

## 2017-02-28 MED ADMIN — PROPOFOL INFUSION 10 MG/ML: 7755 ug/min | INTRAVENOUS | @ 02:00:00 | Stop: 2017-03-14

## 2017-02-28 MED ADMIN — PROPOFOL INFUSION 10 MG/ML: 7755 ug/min | INTRAVENOUS | @ 08:00:00 | Stop: 2017-03-14 | NDC 63323026965

## 2017-02-28 MED ADMIN — SODIUM CHLORIDE 0.9% IV SOLN (250 ML): 510 mL/h | INTRAVENOUS | Stop: 2017-03-14 | NDC 00338004902

## 2017-02-28 MED ADMIN — FENTANYL 2.5 MG/100 ML NS DRIP: 50 ug/h | INTRAVENOUS | @ 15:00:00 | Stop: 2017-03-13 | NDC 00409909461

## 2017-02-28 MED ADMIN — PRAVASTATIN SODIUM 20 MG PO TABS: 40 mg | ORAL | @ 05:00:00 | Stop: 2017-02-28

## 2017-02-28 MED ADMIN — PROPOFOL INFUSION 10 MG/ML: 7755 ug/min | INTRAVENOUS | @ 21:00:00 | Stop: 2017-03-14

## 2017-02-28 MED ADMIN — NOREPINEPHRINE 8, 16, 32 MG/250 ML DRIP: 10.34103.4 ug/min | INTRAVENOUS | @ 19:00:00 | Stop: 2017-03-02 | NDC 36000016210

## 2017-02-28 MED ADMIN — PIPERACILLIN-TAZOBACTAM 3.375 GM/50 ML RTU (EXT 4HR): 3.375 g | INTRAVENOUS | @ 08:00:00 | Stop: 2017-03-02 | NDC 00206886102

## 2017-02-28 MED ADMIN — IPRATROPIUM BROMIDE 0.02 % IN SOLN: 500 ug | RESPIRATORY_TRACT | @ 09:00:00 | Stop: 2017-03-07 | NDC 00487980101

## 2017-02-28 MED ADMIN — LEVOFLOXACIN 750 MG/150 ML RTU: 750 mg | INTRAVENOUS | @ 22:00:00 | Stop: 2017-03-03 | NDC 00143972024

## 2017-02-28 MED ADMIN — HYDROCORTISONE NA SUCCINATE PF 100 MG IJ SOLR: 100 mg | INTRAVENOUS | @ 22:00:00 | Stop: 2017-03-01 | NDC 00009001104

## 2017-02-28 MED ADMIN — HEPARIN SODIUM (PORCINE) 1000 UNIT/ML IJ SOLN: 7340 [IU] | INTRAVENOUS | @ 04:00:00 | Stop: 2017-02-27 | NDC 63323054011

## 2017-02-28 MED ADMIN — FLUTICASONE PROPIONATE HFA 220 MCG/ACT IN AERO: 1 | RESPIRATORY_TRACT | @ 09:00:00 | Stop: 2017-03-02

## 2017-02-28 MED ADMIN — GABAPENTIN 400 MG PO CAPS: 800 mg | ORAL | @ 21:00:00 | Stop: 2017-02-28

## 2017-02-28 NOTE — Other
Patients Clinical Goal:   Clinical Goal(s) for the Shift: 1) Maintain hemodynamic stability, MAP >65. 2) Monitor respiratory status, SpO2 >88%. ETT exchange. 3) CT chest + abd. 4) US kidney, BLE. 5) Monitor I/O and electrolytes. 6) Maintain pt safety and comfort.   Identify possible barriers to advancing the care plan: hemodynamic instability, pulmonary instability  Stability of the patient: Moderately Unstable - medium risk of patient condition declining or worsening    End of Shift Summary:     1) Phenylephrine weaned off, Levophed gtt initiated and titrated to 0.1mcg/kg/min, now at 0.15mcg/kg/min. HR 75-98 NSR, occasional multifocal PVC. Patient received 527ml LR bolus x1, 560ml NS bolus x1 for Na 127. K 4.3. Afebrile. Bedside TTE done, Left ventricular ejection fraction is approximately >75%.  2) Notable audible cuff leak observed at the beginning of shift. Patient placed on Volume Control; 480; 25; 10; 75% to maintain SpO2 >88%. ETT exchange completed and upsize to 8.5, followed by bedside bronchoscopy and lavage.  3) CT chest and abdomen completed, C/f PE with RHS  4) US kidney, BLE extremity completed at bedside, negative   5) Net I/O postive 2.6L.

## 2017-02-28 NOTE — Other
Patients Clinical Goal:   Clinical Goal(s) for the Shift: 1) Maintain hemodynamic stability, MAP >65. 2) Monitor respiratory status, SpO2 >88%. ETT exchange. 3) CT chest + abd. 4) US kidney, BLE. 5) Monitor I/O and electrolytes. 6) Maintain pt safety and comfort.   Identify possible barriers to advancing the care plan: hemodynamic instability, pulmonary instability  Stability of the patient: Moderately Unstable - medium risk of patient condition declining or worsening    End of Shift Summary:     1) Phenylephrine weaned off, Levophed gtt initiated and titrated to 0.24mcg/kg/min, now at 0.55mcg/kg/min. HR 75-98 NSR, occasional multifocal PVC. Patient received LR bolus x1, NS bolus x1 for Na 127. K 4.3. Afebrile. Bedside TTE done, LVEF approximately >75%. Heparin gtt initiated and bolus given c/f PE with RHS, now off. CT chest and abdomen completed, no obvious concern for PE. US kidney c/f worsening renal function, [place] BLE extremity completed at bedside, negative for DVT.   2) Notable audible cuff leak observed at the beginning of shift. Patient placed on Volume Control; 480; 25; 10; 75% to maintain SpO2 >88%. ETT exchange completed and upsize to 8.5, followed by bedside bronchoscopy and lavage. Cuff leak no longer audible. Suction Q2H moderate, tan secretions with trace blood. Lung sounds diminished, diffuse crackles and wheezing throughout.   Results for JERRIN, RECORE (MRN [id number]) as of 02/27/2017 19:30   02/27/2017 09:52 02/27/2017 11:19 02/27/2017 14:09 02/27/2017 16:14 02/27/2017 17:40   pH 7.35 (L) 7.33 (L) 7.34 (L) 7.37 7.42 (H)   pCO2 50 (H) 52 (H) 48 (H) 43 (H) 38   pO2 101 73 (L) 92 (L) 110 81 (L)   Bicarbonate 26.7 (H) 26.5 (H) 25.3 23.8 24.0   Base Excess 1 0 0 -1 0   O2 Sat/Measured 97.3 93.2 (L) 96.0 97.5 95.9   Inspired O2 VC 100% FiO2 100% FiO2 VC PEEP 10 80% 100% PEEP 12 75% FiO2 PEEP 10     3) Net I/O postive 2.6L. Diuretics held in the setting of worsening renal function. Last BM 12/29, MD aware. OGT continued at LIS, output 50mL for 12H.   4) Precedex gtt continued at 0.46mcg/kg/hr, Fentanyl gtt titrated to 162mcg/hr, Propofol titrated to 44mcg/kg/min to maintain comfort and RASS -2. Bilateral soft wrist restraints place to promote patient safety. Family at bedside, updates provided to POC. Safety and comfort maintained throughout shift.

## 2017-02-28 NOTE — Consults
CASE MANAGER ASSESSMENT      Admit ZOXW:[id number]    Date of Initial CM Assessment: 02/28/2017    Problems: Active Problems:    Dyspnea POA: Yes    Acute hypoxemic respiratory failure (HCC/RAF) POA: Yes       Past Medical History:   Diagnosis Date   ??? Cancer (HCC/RAF)    ??? Diabetes (HCC/RAF)    ??? GERD (gastroesophageal reflux disease)    ??? Hypercholesteremia    ??? Hypertension    ??? PAD (peripheral artery disease) (HCC/RAF)    ??? 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 Prostate disease    ??? Scoliosis     adolesent   ??? Vascular disease     Past Surgical History:   Procedure Laterality Date   ??? BACK SURGERY     ??? BILATERAL FEMORAL ARTERY EXPLORATION  2009   ??? BLADDER SURGERY  1999   ??? PROSTATE SURGERY  2000      Primary Care Physician:Noor, Sharlet Salina, MD  [phone]    NEEDS ASSESSMENT:     Level of Function Prior to Admit: Minimal Assist    Current Living Situation: Lives w/Capable Spouse    Support Systems: Spouse/significant other, Children    Hours of Assistance/Day: as needed      Phone Number: [phone]/ [phone]     Living Accommodation: Home/Apartment          Home Health Services: Yes   Type of Service(s) Being Provided: Physical Therapy         Rental DME/O2 in Home: Yes        Type of Rental Equipment: Oxygen          DISCHARGE ASSESSMENT:     Projected Date of Discharge: 03/06/2017  Projected Discharge to: Home (TBD)     Name of Support Person: Lorn, Butcher Phone Number: [phone]     Who is available to transport you upon discharge?: Family Transportation    Additional Questions for Readmitted Patients (include Follow Up Appt Questions and Risk Stratification)  Is this a planned readmission?: No  Interview is with: Other (Comment)  Did you receive your DC instructions from your previous DC?: Yes  Did you speak with your Doctor before coming to the hospital?: No  Are you able to take ALL your medications the way they have been prescribed?: Yes Do you feel isolated/lonely?: No     Risk Stratification  Multiple co-morbidities?: Yes  Patient was recently discharged so case manager will follow through discharge for any specific needs of the patient    Mateo Flow,  02/28/2017

## 2017-02-28 NOTE — Progress Notes
.  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    -- Lines/Tubes --   ETT (size 8.62mm), CVC 3-lumen L fem, A-line, NGT  -- Drips --  Fent, Precedex, propofol, levophed, vasopressin  -- Antimicrobials --  Zosyn, Levofloxacin  ???    IE/SUBJECTIVE:   IE:  - NAEO   - Cuff leak now resolve s/p upsize of ETT to 8.73mm  - Heparin gtt stopped d/t no e/o PE on CTA    Subjective:   - intubated and sedated. Unable to assess    OBJECTIVE:   VITALS:  Temp:  [36.6 ???C (97.9 ???F)-37.4 ???C (99.3 ???F)] 36.6 ???C (97.9 ???F)  Heart Rate:  [78-104] 92  Resp:  [20-29] 22  BP: (88-164)/(58-86) 115/59  NBP Mean:  [69-109] 75  Arterial Line BP (mmHg): (72-155)/(48-151) 89/54  MAP:  [57 mmHg-151 mmHg] 67 mmHg  FiO2 (%):  [60 %-100 %] 80 %  SpO2:  [84 %-97 %] 90 %  01/01 0701 - 01/02 0700  In: 4096 [I.V.:2786]  Out: 561 [Urine:491]     Wt Readings from Last 1 Encounters:   02/28/17 (!) 102.2 kg (225 lb 5 oz)     Recent Labs      02/28/17   0349  02/27/17   2352  02/27/17   1929  02/27/17   1637   GLUCOSEPOC  148*  149*  147*  175*   }  Oxygen Therapy  SpO2: 90 %  O2 Device: ETT, Mechanical Ventilator  FiO2 (%): 80 %  O2 Flow Rate (L/min): 40 L/min     Vent Mode: VC  FiO2 (%):  [60 %-100 %] 80 %  Rate:  [22-25] 22  VT (mL):  [450 mL-480 mL] 480 mL  PEEP (setting):  [7 cmH2O-12 cmH2O] 10 cmH2O    PHYSICAL EXAM:  General - Intubated/Sedated  Neuro - Sedated, +gag, pupils reactive  Cardiovascular - RRR no m/r/g, unable to assess JVP d/t girth, 2+ radial pulses b/l  Lungs - No audible cuff leak, no wheezing anteriorly, mechanical breath sounds  Skin - No visible rashes or lesions Abdomen - Central obesity, soft, NT/ND  Extremeties - Warm and well-perfused. No LE edema.    LABORATORY DATA:   BMP  Recent Labs      02/28/17   0350  02/27/17   1614  02/27/17   0407   02/26/17   0125   NA  131*  127*  132*   < >  135   K  4.2  4.3  4.4   < >  4.2   CL  91*  90*  92*   < >  93*   CO2  21  21  24    < >  25   BUN  18  16  13    < >  11   CREAT  1.43*  1.41*  1.37*   < >  0.94   CALCIUM  8.3*  8.5*  8.6   < >  8.6   MG  2.1*  2.1*  2.4*   < >  1.8   PHOS   --    --    --    --   3.4    < > = values in this interval not displayed.     CBC  Recent Labs      02/28/17   0350  02/27/17   1614  02/27/17   0407   WBC  11.33*  13.75*  9.95   HGB  11.8*  12.9*  12.5*   HCT  34.1*  39.0  37.8*   MCV  86.8  89.4  88.1   PLT  221  256  240     Coags  Recent Labs      02/28/17   0351  02/27/17   2353  02/27/17   1614   APTT  42.5*  41.8*  >180.0*     Blood gas  Recent Labs      02/28/17   1312   PHART  7.36*   PCO2ART  41   PO2ART  99   BICARBART  22.6   BEART  -2       Micro:    Microbiology:   12/31 BAL cx - neg or NGTD  12/28 Resp Cx Probable C.albicans  12/28 Blood Cx NTD  12/28 RVP neg  12/18 Resp Cx Neg (C albicans)  12/13 BAL Cx Neg  12/13 respiratory culture: candida, therwise negative so far; AFB smear negative x 2 (AFB cultures NTD)  12/13 legionella culture neg  Aspergillus Ag EIA - ???<0.50  RVP panel negative  Aspergillus fumigatus (M3) IgE - NEG   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  HIV - neg  Legionella Urinary Ag - neg  Chlamydia/gonorrhea PCR - neg  RPR nonreactive    IMAGING:     CTA chest (1/1):   IMPRESSION:  1. No pulmonary embolus to the segmental level excluding the right lower lobe segmental arteries, where images are motion degraded. 2. Unchanged appearance of coalescent airspace and nodular consolidation, most prominent in the left upper lobe and lingula, as well as confluent mediastinal lymphadenopathy.  3. Reflux of contrast into the hepatic veins, suggesting elevated right heart pressures.  4. Interval increase in size of moderate left pleural effusion.  5. Enlarged main pulmonary artery, which may be seen in pulmonary arterial hypertension.    TTE (12/28):  CONCLUSIONS:   1. Small LV size, moderate septal hypertrophy, normal wall motion and hyperdynamic systolic function.   2. Left ventricular ejection fraction is approximately >75%.   3. Mild LV diastolic dysfunction (grade I, impaired relaxation).   4. Normal right ventricular size and normal systolic function. (TAPSE 2.6 cm, DTI 27.0 cm/s).   5. Mild tricuspid valve regurgitation with estimated pulmonary artery systolic pressure of 51 mm Hg + CVP/JVP (IVC assessment inaccurate in setting of mechanical ventilation).   6. Small anterior pericardial effusion, measuring up to 8 mm. No echocardiographic evidence of tamponade.   7. Compared to TTE performed 02/01/2017, no significant changes noted with LV and RV function. Pericardial effusion is new.      ASSESSMENT & PLAN:   Ryan Shepard???is a 77 y.o.???male???with a history of COPD (on 3LNC), HFpEF, afib,  HTN, DM2, PAD s/p femoral graft, prior prostate CA who was admitted for hypoxemic respiratory failure requiring BiPAP.    RESP:  #Acute on chronic hypoxemic respiratory failure  Admitted from clinic requiring BiPAP in setting of decreased arterial PaO2 and desats to low 80s. Recently admitted w/ bronch showing chronic colonization with aspergillus. Has features c/f malignancy on imaging. Respiratory status continues to worsen with worsened xray. S/p upsized ETT on 1/1 w/ resolution of cuff leak. Initial c/f PE, CTA read no PE so d/c'd heparin gtt, LE dopplers negative for DVT.   - ARDS NET protocol  - Serial ABGs - Malignancy work-up, as below  - Antibiotics, as below  ???  #Chronic COPD  Increased sputum production x 2 months, slight wheezing and recently treated with steroids while hospitalized. Current presentation less c/w COPD exacerbation.  - Duonebs q6h  - Inhaled Flovent BID  - Empiric abx, as below  ???  CV:  #Hypovolemic shock in setting of sedation  Patient requires significant sedation d/t air leak on cuff of ETT, patient changing position can increase air leak. Hypotension requiring pressors in setting of sedation but elevated lactate and signs of end-organ damage including rising Cr. Patient has leukocytosis and on empiric abx, no other signs of infection.  - A-line for accurate BP monitoring  - Pressors: levophed, vasopressin  - Start hydrocortisone and fludrocortisone (1/2- )  - Trend lactate  - Goal MAPs > 60 on A-line    #HFpEF  Home dose lasix PO 40mg  BID. TTE shows hyperdynamic EF >75% and elevated PASP to  - Given that patient appears volume down, hold IV lasix  - Albumin x 1 on 1/2  - BID lytes, daily weights, strict I&O's     #Afib  Rate-controlled. CHADS2VaSC 3-4  - Hold home carvedilol 25 mg BID  - Hold home apixaban 5 mg BID    NEURO:  #Intubated and sedated 2/2 hypoxemic respiratory failure  - Cont sedation with fentanyl/precedex/propofol    ID:  #Leukocytosis  Afebrile. Increased sputum production x 2 months otherwise no localizing signs of infection. Did receive course of methylprednisolone 12/18-12/23 on recent hospitalization. 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  - ID consulted, appreciate recs  - S/p IV Vanc  - D/c azithromycin (1/2)  - F/u urine histoplasma Ag (sendout lab), hold antifungal tx at this time  - Continue IV Zosyn  - Per primary team discussion, start levofloxacin d/t atypical coverage and double GN coverage (1/2- )  ???  HEME:  #Hypervascular Liver Lesions/Mediastinal LAD- c/f malignancy, hx prostate cancer treated with total prostatectomy. Current presentation more amenable to IR biopsy of liver lesions, EBUS less given accessibility of lung lesions.  - IR liver biopsy ordered 1/2  ???  CHRONIC  #HTN  - Hold home BP meds in setting of shock: nifedipine 30mg  daily, losartan 100 mg daily, carvedilol 25 mg BID  ???  #Chronic???pain   Localized to lower back and lower extremities  - Hold home Norco 5-325 q6h PRN while sedated  ???  #GERD chronic/stable  - Pantoprazole PO 40 mg daily 12/31  ???  #Depression/Anxiety  Mood stable, no SI, no anxiety  - Hold home buspirone 15 mg BID, duloxetine DR 60 while sedated  ???  #PAD  s/p femoral-femoral graft 2009.  - Hole home clopidogrel 75 mg daily  ???  #HLD chronic/stable  - Hold home Pravastatin 40 mg qhs  ???  #DM2 with associated diabetic peripheral neuropathy  - Hold  insulin while NPO  - Hole home gabapentin 800 mg TID   ???  F - Plan to initiate TPN 1/3   A - Sedated  S - Fent/precedex/propofol  T - SQ heparin  H - 30 degrees  U - PPI BID  G - NPO  B - Senna qhs PRN  I - ETT 8.71mm (upsided 1/1), PIV 22g R hand (12/28), CVC 3-Lumen L fem (1/1), A-line (12/31), OGT (12/31), PIV x 1 L hand    Disposition: ICU level care required for hypoxemic respiratory failure requiring intubation  ???  Code Status: Full Code  Contact:  Primary Emergency ContactThorn, Dem[place], Home Phone: [phone]    Discussed with attending: Eliane Decree., MD, PhD    Jess Barters, PGY-1  [phone]      Pt seen, evaluated and examined with housestaff.  Please refer to my note for my impression and plan.    Eliane Decree MD PhD  Associate Clinical Professor of Medicine  Pulmonary & Critical Care Medicine  [employer] at [facility]

## 2017-02-28 NOTE — Progress Notes
INFECTIOUS DISEASES PROGRESS NOTE    Patient: Ryan Shepard  MRN: [id number]  DOB: 05-02-1940  Date of Service: 02/28/2017  Requesting Physician: Eliane Decree., MD, PhD  Reason for Consultation: R/o Aspergillus Pneumonia    Chief Complaint: Hypoxia    History of Present Illness:   Mr Balestrieri is a 77 y/o M with COPD, tobacco use disorder, PAD s/p femoral graft, HTN, DM2, HLD, Afib, GERD, depression, anxiety, chronic pain readmitted on 12/28 following discharge to home on 12/26 with recurrence of dyspnea.     Recent hospital course as follows:  12/6: Admitted with a COPD exacerbation in the setting of diagnosed COPD and extensive tobacco use disorder. CXR on admission showing confluent airspace opacities c/f multifocal aspiration/pneumonia. Procal neg. Treated with levaquin (12/6- ) x 7 days and 5 day course of prednisone.   12/11: Patient was planned for discharge but then was found on bacterial resp culture from admission (12/6) to have aspergillus species, few probably candida. Other micro including legionella urine ag neg, MRSA nares neg, HIV neg. Had CT chest done showing airspace and nodular consolidation within the left upper lobe and lingula and lesser within the right upper and right lower lobes consistent with pneumonia, likely fungal. Also with bulky intrathoracic multicompartamental lymphadenopathy. 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 Started on acyclovir 800 mg BID x 5 days. Gonorrhea/chlamydia neg. RPR pending. Bacterial culture no growth to date. HIV neg.   12/12: VSS, no other acute changes. Requiring 3 L NC. No changes in respiratory status.   12/13: VSS. No acute changes. Pulmonology consulted, recommended bronch,  performed same day. Pt states his SOB has improved drastically. No fevers, chills, nausea, vomiting. No new culture data. 12/14: Events noted; the patient became increasingly hypoxic after the bronchoscopy despite using a 15 liter face mask. The blood also noted hypercapnia.  According to the notes the patient reported worsening cough associated blood tinged sputum, but today he claims there was no change in his breath or cough. He was transferred to ICU for further monitoring and started on voriconazole, piperacillin-tazobactam and vancomycin. He denied fever, chills chest pain or changes in his breathing.  12/15: Remains AF with normal WBC. Reports he is coughing more today but breathing feels better.   12/16: Transferred to the floor, AF  12/23: Completed 14 day course of antibiotic treatment (Vanco/Zosyn/Vori x 3 days; Ceftriaxone/Flagyl x 6 days; Vanco/Zosyn x 5 days)  12/26: Discharged home  12/28: Seen in Medicine clinic for routine follow-up, noted to have O2Sat in low 70's on 4L NC, readmitted to hospital with BiPap treatment, Vanco/Zosyn/Azithromycin restarted      Hospital Course (Key Events): Date of Admission 01/28/2017   12/28: Admitted  12/31: On Bipap this AM, continued productive cough. Intubated.  1/1: BAL w/ thick mucous, therapeutic suctioning    1/2: AF, intubated, FiO2 80%, PEEP 10, levo at 0.18. WBC 11 from 13s, lactate still elevated 28. Pt sedated and non-responsive.    Pertinent antimicrobial courses:  Zosyn 12/28-  Azithromycin 12/28-    Vancomycin 12/28-30    Review of Systems:  Unable to perform as pt intubated    Past Medical History:  Past Medical History:   Diagnosis Date   ??? Cancer (HCC/RAF)    ??? Diabetes (HCC/RAF)    ??? GERD (gastroesophageal reflux disease)    ??? Hypercholesteremia    ??? Hypertension    ???  PAD (peripheral artery disease) (HCC/RAF)    ??? 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 Prostate disease    ??? Scoliosis     adolesent   ??? Vascular disease       Allergies:   No Known Allergies    Medications:  Scheduled Meds:  ??? albuterol  2.5 mg Nebulization Q6H ??? azithromycin  500 mg Intravenous Q24H   ??? busPIRone  15 mg Oral BID   ??? chlorhexidine  10 mL Mouth/Throat BID   ??? DULoxetine  60 mg Oral Daily   ??? fluticasone  1 puff Inhalation BID   ??? gabapentin  800 mg Oral TID   ??? heparin  5,000 Units Subcutaneous BID   ??? ipratropium  0.5 mg Nebulization Q6H   ??? lidocaine  2 patch Transdermal Q24H   ??? lidocaine PF  30 mL Endotracheal Once   ??? pantoprazole  40 mg IV Push Q24H   ??? piperacillin/tazobactam  3.375 g Intravenous Q8H   ??? pravastatin  40 mg Oral QHS     Continuous Infusions:  ??? dexmedetomidine 4 mcg/mL drip 0.7 mcg/kg/hr (02/28/17 6295)   ??? fentaNYL 2.5 mg/100 mL drip 150 mcg/hr (02/27/17 1900)   ??? norepinephrine drip 0.08 mcg/kg/min (02/28/17 0030)   ??? phenylephrine drip 10 mg/250 mL (peripheral) Stopped (02/27/17 1220)   ??? propofol drip 75 mcg/kg/min (02/28/17 2841)   ??? sodium chloride 5 mL/hr (02/26/17 0041)   ??? sodium chloride 5 mL/hr (02/27/17 1610)     PRN Meds:.acetaminophen, insulin aspart **AND** dextrose, oxyCODONE, senna      Physical Exam:  Temp:  [36.6 ???C (97.9 ???F)-37.4 ???C (99.3 ???F)] 36.6 ???C (97.9 ???F)  Heart Rate:  [82-104] 100  Resp:  [15-29] 21  BP: (66-164)/(46-86) 134/69  NBP Mean:  [54-109] 89  Arterial Line BP (mmHg): (54-155)/(38-147) 100/56  MAP:  [45 mmHg-148 mmHg] 72 mmHg  FiO2 (%):  [60 %-100 %] 80 %  SpO2:  [84 %-97 %] 91 %  Temp (24hrs), Avg:36.9 ???C (98.5 ???F), Min:36.6 ???C (97.9 ???F), Max:37.4 ???C (99.3 ???F)    Intake and Output:   Last Two Completed Shifts:  I/O last 2 completed shifts:  In: 4096 [I.V.:2786; NG/GT:60; IV Piggyback:1250]  Out: 561 [Urine:491; Emesis/GI output:70]  Vitals:    02/28/17 0500   Weight: (!) 102.2 kg (225 lb 5 oz)   Height:      Gen: Older AA man, intubated, sedated  HEENT: Sclerae anicteric, ETT in place  Neck: Soft, supple without lymphadenopathy.  CV: Regular rate, normal S1/S2. No murmurs appreciated.   Chest: Slightly rhonchorous BL anteriorly w/ vent sounds Abdomen: Soft, non-distended, non-tender, normoactive bowel sounds  Musculoskeletal: No clubbing, cyanosis, or edema.   Neuro: Sedated    Labs:  Lab Results   Component Value Date    WBC 11.33 (H) 02/28/2017    HGB 11.8 (L) 02/28/2017    HCT 34.1 (L) 02/28/2017    MCV 86.8 02/28/2017    PLT 221 02/28/2017     Lab Results   Component Value Date    CREAT 1.43 (H) 02/28/2017    BUN 18 02/28/2017    NA 131 (L) 02/28/2017    K 4.2 02/28/2017    CL 91 (L) 02/28/2017    CO2 21 02/28/2017     Lab Results   Component Value Date    ALT 18 02/08/2017    AST 20 02/08/2017    ALKPHOS 50 02/08/2017  BILITOT 0.4 02/08/2017       Microbiology:   12/31 BAL cx - neg or NGTD  12/28 Resp Cx Probable C.albicans  12/28 Blood Cx NTD  12/28 RVP neg  12/18 Resp Cx Neg (C albicans)  12/13 BAL Cx Neg  12/13 respiratory culture: candida, therwise negative so far; AFB smear negative x 2 (AFB cultures NTD)  12/13 legionella culture neg  Aspergillus Ag EIA -  <0.50  RVP panel negative  Aspergillus fumigatus (M3) IgE - NEG   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  HIV - neg  Legionella Urinary Ag - neg  Chlamydia/gonorrhea PCR - neg  RPR nonreactive    Imaging Reviewed by Me:   1/2 CXR: My read - ETT in place, BL airspace disease, unchanged from prior    12/28 CXR  Unchanged enlarged cardiomediastinal silhouette from combination of mediastinal adenopathy and left hilar mass extending into upper lobe, and lower lobe with left lymphangitic carcinomatosis. Unchanged moderate left pleural effusion.    12/20 CT Chest:  1.   Redemonstration of coalescent airspace and nodular consolidation, most prominent in the left upper lobe and lingula with peripheral groundglass associated with Unchanged to slightly more prominent mediastinal and bilateral hilar lymphadenopathy.   This finding is nonspecific but concerning for malignancy versus pneumonia with etiologies including fungal infection.   2.  Increase in size of pleural and pericardial effusions and anasarca consistent with volume overload.  3.   Dilated main pulmonary artery measuring 39 mm reflecting pulmonary arterial hypertension.  4.  Severe coronary artery calcifications.    12/20 CT A/P:  History of prostate cancer treated with total prostatectomy with at least 4 hypervascular hepatic masses measuring up to 5 cm in the right hepatic lobe, suspicious for metastatic disease. No abdominal or pelvic lymphadenopathy.  Severe atherosclerotic disease with thrombosed left common, external, and femoral artery, and patent femorofemoral bypass.      Assessment:   77yo M w/ h/o COPD, prostate CA with possible metastatic disease, recent admission for dyspnea treated with 14 days antibiotics, now readmitted with hypoxia but pt comfortable on exam. ID consulted for further w/u and management.    Pt initially w/ severe hypoxia on supplemental O2, diffuse nodular opacities in lungs, and continued production of purulent sputum. Recurrent hypoxia likely secondary to a persistent or recurrent bacterial infection secondary to obstructive pulmonary process (COPD +/- lung metastases). Treating empirically for recurrent PNA. S/p 5 days of azithromycin. Continuing on empiric pip-tazo.     Although concern for possible aspergillus pneumonia is reasonable in this patient with persistent lung disease and imaging consistent with fungal infection, only A [place] (not a common cause of pulmonary disease) has been isolated on a single expectorated sputum sample, with no growth on multiple other respiratory cultures, including a BAL culture, and negative galactomannan assay (while on Zosyn).     Problem List:  - COPD  - Prostate cancer with possible metastatic disease  - Hypoxia - Bacterial pneumonia (unspecified)    - Precautions: No infectious isolation    Recommendations/Plan:   - Cont pip-tazo for pneumonia  - Today is last day of azithromycin, then stop  - F/u resp cx, including AFB cx from 12/13  - F/u urine histo Ag  - Cont  to hold off on antifungal coverage    Thank you for this consultation. We will continue to follow with you. Please page [phone] (General ID) with any questions.  DWA Dr. Chestine Spore  Recommendations d/w ICU team.    Author:  Jeanne Ivan, MD  Infectious Disease Fellow, PGY-4  02/28/2017  1:46 PM    Attending Addendum:  I have seen and examined the patient on 02/28/2017 and independently reviewed all lab results, imaging, and microbiology culture results and agree with the findings as described by the Fellow. Their note accurately reflects the plan that we developed together.    Mesiah Manzo L. Chestine Spore, MD  02/28/2017  3:39 PM

## 2017-02-28 NOTE — Other
Patients Clinical Goal:   Clinical Goal(s) for the Shift: 1. Monitor hemodynamic stability, 2. Monitor respiratory status, 3. Maintain comfort and safety.  Identify possible barriers to advancing the care plan: hemodynamic and pulmonary instability  Stability of the patient: Moderately Unstable - medium risk of patient condition declining or worsening    End of Shift Summary:   1. Levophed titrated to 0.38mcg/kg/min, able to keep MAP goal 65. NSR with occasional PVCs, HR: 75-92.  2. ETT 8.5Fr, 27cm at lip. Cuff inflated with no audible cuff leak. VC mode FiO2: 75-80% keeps SpO2 greater than 88%.   3. Assisted patient as needed, comfort and safety maintained.

## 2017-02-28 NOTE — Procedures
.  PULMONARY PROCEDURE NOTE    DATE OF PROCEDURE: 02/27/2017    TYPE OF PROCEDURE:  1. Flexible video bronchoscopy.  2. Therapeutic suctioning of secretions    ATTENDING PHYSICIAN:  Al Shathri    ASSISTING PHYSICIAN: Newt Lukes, MD    PRE-PROCEDURE DIAGNOSIS / INDICATION  1. Concern for mucous plugging  2. Hypoxemic respiratory failure    BRONCHOSCOPIC FINDINGS:  1. Diffuse thick purulent sputum arising from LUL, Lingula and LLL, s/p therapeutic suctioning  2. ETT position appropriate    MEDICATIONS / SEDATION:  40mg  propofol    DESCRIPTION OF PROCEDURE:  After discussion of risks, benefits, and alternatives of the procedure, informed consent was obtained from the patient with ICU bundle consent. This was a flexible bronchoscopy for mucous plugging, hypoxemia.   A time out was performed prior to initiation of the procedure. The patient received sedation with the aforementioned medications, and the bronchoscope was inserted into the ETT and into the trachea.      All lobar segments and sub-segments in bilateral lungs were visualized, and findings were notable for diffuse, thick purulent mucous from the LLL, LUL, Lingula, which were therapeutically suctioning. There remains evidence of endobronchial lesions with intrinsic and extrinsic compression of LUL, and with extrinsic compression of LLL. R lung segments and sub segments were unremarkable with minimal thin yellow secretions. The patient tolerated the procedure well without complications.    COMPLICATIONS:  None    EBL none    SPECIMENS:  N/A    Dr. Madaline Guthrie was present for and supervised the entire procedure.    Newt Lukes, MD  02/27/2017, 7:10 PM  [facility]          Attending attestation:  I agree with the procedure note above. I was present during the entire procedure. I was supervising the fellow.      Emeline Gins, MD  Interventional Pulmonology  Critical Care Medicine  Pager: [phone]

## 2017-02-28 NOTE — Consults
Patient is currently intubated o the ventilator VC in the ICU also on Fentanyl, Propofol, Precedex , Levo gtts and Heparin gtt; no DCP yet.

## 2017-02-28 NOTE — Consults
NUTRITION ASSESSMENT (Adult)    Admit Date: 02/28/17     Date of Birth: 03/18/1940 Gender: male MRN: [id number]     Date of Assessment: 02/28/2017   Status: Assessment   Indication: Respiratory failure on ventilator   Subjective: Discussed on morning lightening rounds and with RN.    Problems: Active Problems:    Dyspnea POA: Yes    Acute hypoxemic respiratory failure (HCC/RAF) POA: Yes       Past Medical History:   Diagnosis Date   ??? Cancer (HCC/RAF)    ??? Diabetes (HCC/RAF)    ??? GERD (gastroesophageal reflux disease)    ??? Hypercholesteremia    ??? Hypertension    ??? PAD (peripheral artery disease) (HCC/RAF)    ??? 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 Prostate disease    ??? Scoliosis     adolesent   ??? Vascular disease     Past Surgical History:   Procedure Laterality Date   ??? BACK SURGERY     ??? BILATERAL FEMORAL ARTERY EXPLORATION  2009   ??? BLADDER SURGERY  1999   ??? PROSTATE SURGERY  2000           Data   Intake/Outputs: I/O last 2 completed shifts:  In: 4096 [I.V.:2786; NG/GT:60; IV Piggyback:1250]  Out: 576 [Urine:506; Emesis/GI output:70]    Pertinent Medications:  Scheduled Meds:  ??? albumin  25 g Intravenous Once   ??? albuterol  2.5 mg Nebulization Q6H   ??? busPIRone  15 mg Oral BID   ??? chlorhexidine  10 mL Mouth/Throat BID   ??? DULoxetine  60 mg Oral Daily   ??? fludrocortisone  0.1 mg Oral Daily   ??? fluticasone  1 puff Inhalation BID   ??? gabapentin  800 mg Oral TID   ??? heparin  5,000 Units Subcutaneous BID   ??? hydrocortisone IV  100 mg Intravenous Q6H   ??? ipratropium  0.5 mg Nebulization Q6H   ??? levoFLOXacin  750 mg Intravenous Q48H   ??? lidocaine  2 patch Transdermal Q24H   ??? lidocaine PF  30 mL Endotracheal Once   ??? pantoprazole  40 mg IV Push Q24H   ??? piperacillin/tazobactam  3.375 g Intravenous Q8H   ??? pravastatin  40 mg Oral QHS   ??? sodium chloride  400 mL Intravenous Once     Continuous Infusions:  ??? dexmedetomidine 4 mcg/mL drip 0.7 mcg/kg/hr (02/28/17 1100) ??? fentaNYL 2.5 mg/100 mL drip 150 mcg/hr (02/28/17 1100)   ??? norepinephrine drip 0.15 mcg/kg/min (02/28/17 1125)   ??? phenylephrine drip 10 mg/250 mL (peripheral) Stopped (02/27/17 1220)   ??? propofol drip 75 mcg/kg/min (02/28/17 1100)   ??? sodium chloride 5 mL/hr (02/26/17 0041)   ??? sodium chloride 5 mL/hr (02/27/17 1610)   ??? vasopressin drip       PRN Meds:.acetaminophen, insulin aspart **AND** dextrose, oxyCODONE, senna    FDI Target Drugs: No      Pertinent Labs:   Na 131  K 4.2  Phos 3.4 (12/31)  BUN/creat 18/1.43    Accu-Chek:   Recent Labs      02/27/17   1637  02/27/17   1929  02/27/17   2352  02/28/17   0349  02/28/17   0827  02/28/17   1150   GLUCOSEPOC  175*  147*  149*  148*  146*  212*       Respiratory Status / O2 Device: ETT, Mechanical Ventilator  Pressure Injury: none    Additional data:    77yo male admitted with resp failure  Hx: COPD (on 4LNC), HFpEF, afib, HTN, DM2, PAD s/p femoral graft, prior prostate CA    12/31 - intubated  1/2 - concern for malignancy per MD     Diet Info   ??? Allergies:   Patient has no known allergies.  ??? Cultural/Ethnic/Religious/Other Food Preferences:  Unable to Assess     ??? Nutrition prior to admit:  Unable to assess.  ??? Current diet order:     Diets/Supplements/Feeds   Diet    Diet NPO Except for: Medications     Start Date/Time: 02/28/17 1210     Number of Occurrences:  Until Specified     Order Comments: Upcoming Surgeries and Procedures  BRONCHOSCOPY FLEXIBLE Date:02/26/2017 Time:1255       ??? PO % consumed: NPO  ??? Parenteral Nutrition: none  ??? Enteral Nutrition: none  ??? Other caloric sources: receiving variable amounts of propofol; provides 1.1cal/ml    Anthropometrics:    Height: 185.4 cm (6' 0.99'')  Admit Weight: 97.1 kg (214 lb) (02/08/2017 1134) Last 5 recorded weights:  Weights 02/25/2017 02/26/2017 02/27/2017 02/27/2017 02/28/2017   Weight 103.1 kg 99.4 kg 100.9 kg 100.9 kg 102.2 kg            IBW: 83.5 kg (184 lb)  % Ideal Body Weight: 121 % BMI (Calculated): 29.8    Usual Weight:  (n/a-patient intubated)           Estimated Needs   Using admit wt: 97kg  2400-2900cal (25-30cal/kg)  125-145gm pro (1.3-1.5gm/kg)     Diet Education   No diet education needs at this time            Malnutrition Assessment   Inflammation: Mild-Moderate/Chronic   Energy Intake: Unable to assess   Weight Loss: > 5% in 1 month    Nutrition-Focused Physical Exam: 02/28/2017  Not performed: Deferred to next visit.     Patient meets criteria for: Unable to complete Malnutrition assessment at this time    Nutrition Assessment   1. Nutritional Status-      Reviewed old records for weight history.      Loss of ~16kg over ~ 15yrs (gradual)       Loss of ~ 6kg (6%) in less than one month pta noted.       Admit wt remains > ideal (114%).            2. Nutrition-       NPO.      Receiving propfol (provides 1.1cal/ml). No tg.    3. Gastrointestinal-      OGT to suction. out.       Last stool 12/29-12/30.    4. Glucose Control-       Hx DM. No A1c. Variable glucose control. Meds include ssi.     5. Micronutrients-      No levels checked.       No supplementation.     Recommendations / Care Plan      1. When ready to feed recommend Glucerna 1.2 28ml/hr.       Advance 25ml every 8 hours to goal of 19ml/hr.      Prov: 2448cal (25cal/kg)                122gm pro (1.3gm/kg)  2. Check Tg, 2/2 propofol.  3. Obtain daily wt.   4. Paged MD with recs.  Author:  Valene Bors, MS, RD pg. [phone]  02/28/2017 12:17 PM

## 2017-02-28 NOTE — Progress Notes
I saw and evaluated Ryan Shepard.  I discussed the case with the Cleophus Molt, MD and agree with the history, physical, impression and plan. Comes in post hospital discharge, however does not feel well, it's very tired, and saturation is below 80% on 4 L of oxygen.  Will send to our ED for evaluationand treatment.    Annabelle Harman, MD

## 2017-03-01 ENCOUNTER — Ambulatory Visit: Payer: MEDICARE | Attending: Pulmonary Disease

## 2017-03-01 ENCOUNTER — Ambulatory Visit: Payer: PRIVATE HEALTH INSURANCE

## 2017-03-01 ENCOUNTER — Ambulatory Visit: Payer: Commercial Managed Care - HMO

## 2017-03-01 DIAGNOSIS — K769 Liver disease, unspecified: Secondary | ICD-10-CM

## 2017-03-01 DIAGNOSIS — D72823 Leukemoid reaction: Secondary | ICD-10-CM

## 2017-03-01 LAB — UA,Dipstick: GLUCOSE: NEGATIVE (ref 1.005–1.030)

## 2017-03-01 LAB — Blood Culture Detection
BLOOD CULTURE FINAL STATUS: NEGATIVE
BLOOD CULTURE FINAL STATUS: NEGATIVE
BLOOD CULTURE FINAL STATUS: NEGATIVE
BLOOD CULTURE FINAL STATUS: NEGATIVE

## 2017-03-01 LAB — Basic Metabolic Panel
CALCIUM: 8.3 mg/dL — ABNORMAL LOW (ref 8.6–10.4)
GFR ESTIMATE FOR AFRICAN AMERICAN: 63 mg/dL (ref 8–19)
TOTAL CO2: 21 mmol/L (ref 20–30)

## 2017-03-01 LAB — Legionella Culture: LEGIONELLA CULTURE: NEGATIVE

## 2017-03-01 LAB — Glucose,POC
GLUCOSE,POC: 165 mg/dL — ABNORMAL HIGH (ref 65–99)
GLUCOSE,POC: 195 mg/dL — ABNORMAL HIGH (ref 65–99)
GLUCOSE,POC: 186 mg/dL — ABNORMAL HIGH (ref 65–99)
GLUCOSE,POC: 199 mg/dL — ABNORMAL HIGH (ref 65–99)
GLUCOSE,POC: 196 mg/dL — ABNORMAL HIGH (ref 65–99)
GLUCOSE,POC: 177 mg/dL — ABNORMAL HIGH (ref 65–99)

## 2017-03-01 LAB — Blood Lactate
BLOOD LACTATE: 24 mg/dL (ref 5–25)
BLOOD LACTATE: 25 mg/dL (ref 5–25)
BLOOD LACTATE: 25 mg/dL (ref 5–25)
BLOOD LACTATE: 25 mg/dL (ref 5–25)
BLOOD LACTATE: 26 mg/dL — ABNORMAL HIGH (ref 5–25)

## 2017-03-01 LAB — Fungal Culture Respiratory

## 2017-03-01 LAB — Blood Gases, arterial
ABG INSPIRED O2: 70 mmol/L (ref 7.37–7.41)
BASE EXCESS: -2 mmol/L (ref ?–2)
BASE EXCESS: -2 mmol/L (ref ?–2)
BASE EXCESS: -3 mmol/L — ABNORMAL LOW (ref ?–2)
BICARBONATE: 22.5 mmol/L (ref 22.0–26.0)
PH: 7.37 (ref 7.37–7.41)

## 2017-03-01 LAB — Nocardia Culture: NOCARDIA CULTURE: NEGATIVE

## 2017-03-01 LAB — CBC
MEAN CORPUSCULAR HEMOGLOBIN: 28.6 pg (ref 26.4–33.4)
PLATELET COUNT, AUTO: 195 10*3/uL (ref 143–398)

## 2017-03-01 LAB — UA,Microscopic

## 2017-03-01 LAB — Flow Cytometry

## 2017-03-01 LAB — Phosphorus: PHOSPHORUS: 5 mg/dL — ABNORMAL HIGH (ref 2.3–4.4)

## 2017-03-01 LAB — Magnesium
MAGNESIUM: 2.1 meq/L — ABNORMAL HIGH (ref 1.4–1.9)
MAGNESIUM: 2 meq/L — ABNORMAL HIGH (ref 1.4–1.9)

## 2017-03-01 MED ADMIN — HYDROCORTISONE NA SUCCINATE PF 100 MG IJ SOLR: 50 mg | INTRAVENOUS | @ 21:00:00 | Stop: 2017-03-05 | NDC 00009001104

## 2017-03-01 MED ADMIN — FENTANYL 2.5 MG/100 ML NS DRIP: 50 ug/h | INTRAVENOUS | @ 20:00:00 | Stop: 2017-03-13 | NDC 00409909461

## 2017-03-01 MED ADMIN — PROPOFOL INFUSION 10 MG/ML: 7755 ug/min | INTRAVENOUS | @ 20:00:00 | Stop: 2017-03-14 | NDC 63323026965

## 2017-03-01 MED ADMIN — HYDROCORTISONE NA SUCCINATE PF 100 MG IJ SOLR: 50 mg | INTRAVENOUS | @ 14:00:00 | Stop: 2017-03-05 | NDC 00009001104

## 2017-03-01 MED ADMIN — ALBUTEROL SULFATE (5 MG/ML) 0.5% IN NEBU: 2.5 mg | RESPIRATORY_TRACT | @ 21:00:00 | Stop: 2017-03-07 | NDC 00487990130

## 2017-03-01 MED ADMIN — IPRATROPIUM BROMIDE 0.02 % IN SOLN: 500 ug | RESPIRATORY_TRACT | @ 13:00:00 | Stop: 2017-03-07 | NDC 00487980101

## 2017-03-01 MED ADMIN — FENTANYL 2.5 MG/100 ML NS DRIP: 50 ug/h | INTRAVENOUS | @ 09:00:00 | Stop: 2017-03-13 | NDC 00409909461

## 2017-03-01 MED ADMIN — PANTOPRAZOLE SODIUM 40 MG IV SOLR: 40 mg | INTRAVENOUS | @ 09:00:00 | Stop: 2017-03-13 | NDC 55150020210

## 2017-03-01 MED ADMIN — INSULIN ASPART 100 UNIT/ML SC SOPN: 3 mL | SUBCUTANEOUS | Stop: 2017-03-03 | NDC 00169633910

## 2017-03-01 MED ADMIN — HYDROCORTISONE NA SUCCINATE PF 100 MG IJ SOLR: 100 mg | INTRAVENOUS | @ 02:00:00 | Stop: 2017-03-01 | NDC 00009001104

## 2017-03-01 MED ADMIN — ALBUTEROL SULFATE (5 MG/ML) 0.5% IN NEBU: 2.5 mg | RESPIRATORY_TRACT | @ 08:00:00 | Stop: 2017-03-07 | NDC 00487990130

## 2017-03-01 MED ADMIN — FLUDROCORTISONE ACETATE 0.1 MG PO TABS: .05 mg | ORAL | @ 17:00:00 | Stop: 2017-03-05 | NDC 68084028801

## 2017-03-01 MED ADMIN — INSULIN ASPART 100 UNIT/ML SC SOPN: 3 mL | SUBCUTANEOUS | @ 09:00:00 | Stop: 2017-03-03 | NDC 00169633910

## 2017-03-01 MED ADMIN — HEPARIN SODIUM (PORCINE) 5000 UNIT/ML IJ SOLN: 5000 [IU] | SUBCUTANEOUS | @ 17:00:00 | Stop: 2017-03-01 | NDC 25021040201

## 2017-03-01 MED ADMIN — IPRATROPIUM BROMIDE 0.02 % IN SOLN: 500 ug | RESPIRATORY_TRACT | @ 01:00:00 | Stop: 2017-03-07 | NDC 00487980101

## 2017-03-01 MED ADMIN — PIPERACILLIN-TAZOBACTAM 3.375 GM/50 ML RTU (EXT 4HR): 3.375 g | INTRAVENOUS | @ 17:00:00 | Stop: 2017-03-02 | NDC 00206886102

## 2017-03-01 MED ADMIN — DEXMEDETOMIDINE 4 MCG/ML DRIP: 20.68 ug/h | INTRAVENOUS | @ 20:00:00 | Stop: 2017-03-04 | NDC 55150020902

## 2017-03-01 MED ADMIN — FUROSEMIDE 10 MG/ML IJ SOLN: 40 mg | INTRAVENOUS | @ 19:00:00 | Stop: 2017-03-01 | NDC 36000028325

## 2017-03-01 MED ADMIN — VASOPRESSIN 20 UNITS, 40 UNITS/100 ML DRIP: 1.4 [IU]/h | INTRAVENOUS | @ 17:00:00 | Stop: 2017-03-01

## 2017-03-01 MED ADMIN — VASOPRESSIN 20 UNITS, 40 UNITS/100 ML DRIP: 1.4 [IU]/h | INTRAVENOUS | @ 13:00:00 | Stop: 2017-03-01 | NDC 42023016425

## 2017-03-01 MED ADMIN — HYDROCORTISONE NA SUCCINATE PF 100 MG IJ SOLR: 50 mg | INTRAVENOUS | @ 09:00:00 | Stop: 2017-03-05 | NDC 00009001104

## 2017-03-01 MED ADMIN — VASOPRESSIN 20 UNITS, 40 UNITS/100 ML DRIP: 1.4 [IU]/h | INTRAVENOUS | @ 04:00:00 | Stop: 2017-03-01 | NDC 42023016425

## 2017-03-01 MED ADMIN — FENTANYL 2.5 MG/100 ML NS DRIP: 50 ug/h | INTRAVENOUS | @ 03:00:00 | Stop: 2017-03-13

## 2017-03-01 MED ADMIN — IPRATROPIUM BROMIDE 0.02 % IN SOLN: 500 ug | RESPIRATORY_TRACT | @ 21:00:00 | Stop: 2017-03-07 | NDC 00487980101

## 2017-03-01 MED ADMIN — PROPOFOL INFUSION 10 MG/ML: 7755 ug/min | INTRAVENOUS | @ 17:00:00 | Stop: 2017-03-14 | NDC 63323026965

## 2017-03-01 MED ADMIN — INSULIN ASPART 100 UNIT/ML SC SOPN: 3 mL | SUBCUTANEOUS | @ 12:00:00 | Stop: 2017-03-03 | NDC 00169633910

## 2017-03-01 MED ADMIN — INSULIN ASPART 100 UNIT/ML SC SOPN: 3 mL | SUBCUTANEOUS | @ 04:00:00 | Stop: 2017-03-03 | NDC 00169633910

## 2017-03-01 MED ADMIN — IPRATROPIUM BROMIDE 0.02 % IN SOLN: 500 ug | RESPIRATORY_TRACT | @ 08:00:00 | Stop: 2017-03-07 | NDC 00487980101

## 2017-03-01 MED ADMIN — FUROSEMIDE 10 MG/ML IJ SOLN: 40 mg | INTRAVENOUS | @ 06:00:00 | Stop: 2017-03-01 | NDC 36000028325

## 2017-03-01 MED ADMIN — CHLORHEXIDINE GLUCONATE 0.12 % MT SOLN: 10 mL | OROMUCOSAL | @ 04:00:00 | Stop: 2017-03-13 | NDC 52376002110

## 2017-03-01 MED ADMIN — PIPERACILLIN-TAZOBACTAM 3.375 GM/50 ML RTU (EXT 4HR): 3.375 g | INTRAVENOUS | Stop: 2017-03-02 | NDC 00206886102

## 2017-03-01 MED ADMIN — LIDOCAINE HCL (PF) 1 % IJ SOLN: @ 20:00:00 | Stop: 2017-03-01 | NDC 00143959525

## 2017-03-01 MED ADMIN — PROPOFOL INFUSION 10 MG/ML: 7755 ug/min | INTRAVENOUS | @ 02:00:00 | Stop: 2017-03-14 | NDC 63323026965

## 2017-03-01 MED ADMIN — NOREPINEPHRINE 8, 16, 32 MG/250 ML DRIP: 10.34103.4 ug/min | INTRAVENOUS | @ 07:00:00 | Stop: 2017-03-02

## 2017-03-01 MED ADMIN — NOREPINEPHRINE 8, 16, 32 MG/250 ML DRIP: 10.34103.4 ug/min | INTRAVENOUS | @ 19:00:00 | Stop: 2017-03-02

## 2017-03-01 MED ADMIN — DEXMEDETOMIDINE 4 MCG/ML DRIP: 20.68 ug/h | INTRAVENOUS | @ 06:00:00 | Stop: 2017-03-04 | NDC 55150020902

## 2017-03-01 MED ADMIN — PROPOFOL INFUSION 10 MG/ML: 7755 ug/min | INTRAVENOUS | @ 09:00:00 | Stop: 2017-03-14 | NDC 63323026965

## 2017-03-01 MED ADMIN — CHLORHEXIDINE GLUCONATE 0.12 % MT SOLN: 10 mL | OROMUCOSAL | @ 17:00:00 | Stop: 2017-03-13 | NDC 52376002110

## 2017-03-01 MED ADMIN — FLUTICASONE PROPIONATE HFA 220 MCG/ACT IN AERO: 1 | RESPIRATORY_TRACT | @ 22:00:00 | Stop: 2017-03-02

## 2017-03-01 MED ADMIN — ALBUTEROL SULFATE (5 MG/ML) 0.5% IN NEBU: 2.5 mg | RESPIRATORY_TRACT | @ 13:00:00 | Stop: 2017-03-07 | NDC 00487990130

## 2017-03-01 MED ADMIN — PIPERACILLIN-TAZOBACTAM 3.375 GM/50 ML RTU (EXT 4HR): 3.375 g | INTRAVENOUS | @ 09:00:00 | Stop: 2017-03-02 | NDC 00206886102

## 2017-03-01 MED ADMIN — FLUTICASONE PROPIONATE HFA 220 MCG/ACT IN AERO: 1 | RESPIRATORY_TRACT | @ 08:00:00 | Stop: 2017-03-02

## 2017-03-01 MED ADMIN — INSULIN ASPART 100 UNIT/ML SC SOPN: 3 mL | SUBCUTANEOUS | @ 17:00:00 | Stop: 2017-03-03 | NDC 00169633910

## 2017-03-01 MED ADMIN — PROPOFOL INFUSION 10 MG/ML: 7755 ug/min | INTRAVENOUS | @ 11:00:00 | Stop: 2017-03-14 | NDC 63323026965

## 2017-03-01 MED ADMIN — ALBUTEROL SULFATE (5 MG/ML) 0.5% IN NEBU: 2.5 mg | RESPIRATORY_TRACT | @ 01:00:00 | Stop: 2017-03-07 | NDC 00487990130

## 2017-03-01 MED ADMIN — INSULIN ASPART 100 UNIT/ML SC SOPN: 3 mL | SUBCUTANEOUS | @ 21:00:00 | Stop: 2017-03-03 | NDC 00169633910

## 2017-03-01 MED ADMIN — HEPARIN SODIUM (PORCINE) 5000 UNIT/ML IJ SOLN: 5000 [IU] | SUBCUTANEOUS | @ 04:00:00 | Stop: 2017-03-01 | NDC 25021040201

## 2017-03-01 NOTE — Other
Patients Clinical Goal:   Clinical Goal(s) for the Shift: 1) MAP>65 2) SpO2>88%, ARDSnet 3) manage agitation 4) I/O goal net even to slightly positive 5) Pt comfort/safety  Identify possible barriers to advancing the care plan: hypotension, ARDS, respiratory failure, COPD  Stability of the patient: Unstable - high likelihood or risk of patient condition declining or worsening; patient condition declining or worsening    End of Shift Summary: 1) MAP>65 throughout shift on vasopressin at 2.4u/hr and levophed turned off at 0615. Otherwise hemodynamically stable. 2) SpO2>88% on ARDSnet. Minimal secretions. Current settings VC 480TV, peep 10, RR 22, FiO2 60%. 3) Pt required increase in propofol to 67mcg/kg/min in order to manage severe agitation and danger to himself. Now calm and comfortable. 4) Pt +1.2L despite 40mg  lasix. 5) Pt comfort and safety maximized at all times.   Pt febrile to 38.3, MD's made aware and pt fully pan-cultured. No change in antibiotics.   No calls or visitors overnight.

## 2017-03-01 NOTE — Brief Op Note
INTERVENTIONAL RADIOLOGY BRIEF OPERATIVE NOTE    03/01/2017 1:44 PM    PATIENT: Ryan Shepard  MRN: [id number]  DOB: [birth year]  DATE OF PROCEDURE: 03/01/2017    OPERATORS:  Gerome Apley, MD (IR Attending)  Nigel Berthold, MD ([facility] Fellow)    PROCEDURE(S):   1. Liver lesion biopsy    PRE PROCEDURE DIAGNOSIS: Liver lesions.    POST PROCEDURE DIAGNOSIS: Same.    MEDICATIONS: See MAR.    CONTRAST: None    ESTIMATED BLOOD LOSS: <71 cc    COMPLICATIONS: None.    SPECIMENS: 5 core biopsy    FINDINGS:   1. Multiple hypoechoic lesions are seen in the right lobe of the liver.    PLAN:  1. Transfer back to floor for observation and at least 3 hours of bed rest.    FULL REPORT TO FOLLOW UNDER IMAGING TAB.    Nigel Berthold, MD [facility] Fellow)  [phone]  03/01/2017 1:44 PM

## 2017-03-01 NOTE — Consults
Pharmaceutical Services ??? Parenteral Nutrition Initial Note    TPN Consults     Start     Ordered    02/28/17 1220  Consult to Pharmacy for TPN/PPN per protocol  Once     Provider:  (Not yet assigned)   Question Answer Comment   Indication for use critical illness prolonged malnutrition    Is this request for TPN or PPN? TPN        02/28/17 1211           Other Objective Data   Allergies: Patient has no known allergies.  Height:   Most recent documented height   02/27/17 1.854 m (6' 0.99'')     Actual Weight:   Most recent documented weight   02/28/17 (!) 102.2 kg (225 lb 5 oz)     TPN Weight: 97 kg (admission weight)    I/O:   Intake/Output Summary (Last 24 hours) at 03/01/17 1139  Last data filed at 03/01/17 1000   Gross per 24 hour   Intake          2363.75 ml   Output             1519 ml   Net           844.75 ml     Respiratory Status / O2 Device: ETT    Current Labs  Accu-Chek:   Recent Labs      03/01/17   0845  03/01/17   0412  03/01/17   0058  02/28/17   2008  02/28/17   1611  02/28/17   1150   GLUCOSEPOC  196*  199*  186*  195*  165*  212*       Other Labs:  Most recent result within past 30 days  Result Component Current Result Ref Range   Sodium 134 (L) (03/01/2017) 135 - 146 mmol/L   Potassium 3.9 (03/01/2017) 3.6 - 5.3 mmol/L   Chloride 94 (L) (03/01/2017) 96 - 106 mmol/L   Total CO2 21 (03/01/2017) 20 - 30 mmol/L   Urea Nitrogen 21 (03/01/2017) 7 - 22 mg/dL   Creatinine 6.04 (07/01/979) 0.60 - 1.30 mg/dL   Glucose 191 (H) (06/03/8293) 65 - 99 mg/dL   Ionized Ca++,Corrected Not in Time Range   Magnesium 2.0 (H) (03/01/2017) 1.4 - 1.9 mEq/L   Phosphorus 5.0 (H) (03/01/2017) 2.3 - 4.4 mg/dL   Albumin 2.8 (L) (62/13/0865) 3.9 - 5.0 g/dL   Prealbumin Not in Time Range   Total Protein 5.9 (L) (02/16/2017) 6.1 - 8.2 g/dL   Triglycerides Not in Time Range   Alkaline Phosphatase 50 (02/08/2017) 37 - 113 U/L   Alanine Aminotransferase 18 (02/08/2017) 8 - 64 U/L   Aspartate Aminotransferase 20 (02/08/2017) 13 - 47 U/L Bilirubin,Conjugated <0.2 (02/08/2017) <=0.3 mg/dL   Bilirubin,Total 0.4 (02/08/2017) 0.1 - 1.2 mg/dL       Assessment:  Clinical Considerations Impacting TPN Formulation: none    Nutritional Goals and Estimated Nutritional Requirements: see full RD assessment from 1/2 and TPN recs from 1/3.    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  TPN was requested given no nutrition since intubation and concern for aspiration.  -Will start TPN as per nutrition recommendations.  -Starting with conservative electrolytes in TPN given AKI.  No phos in TPN to start since serum phosphorous 5 today.  -Patient has been slightly hyperglycemic in the last 24 hours, likely due  to stress dose steroid initiation.  Required 10 units from sliding scale yesterday.  Will start with 20 units in TPN since pt has baseline DM.  If unable to control sugars well may need to start an insulin drip.  -No lipids with TPN to start since pt is on propofol, received 500 ml = 500 kcal yesterday.  Once propofol is stopped, can start lipids at 350 ml daily as per RD recs.  -Ordered baseline prealbumin, triglycerides (on propofol) for 1/4 AM labs.  Also ordered daily ionized calcium and phosphorous to be added to AM labs.    -Recommend transition to TF when pt more stable.  Patient appears to have a functional GI tract so indication for TPN is less clear.    Current TPN Order:  TPN Medication Recent History (Show up to 2 orders; newest on the left.)     Start date and time   03/01/2017 2200      TPN Adult [[id number]]    Order Status  Active       Macro Ingredients    amino acids (Travasol)  6 %    dextrose  18 %       QS Base    sterile water for inj  206.58 mL       Electrolytes PER DAY    sodium chloride  50 mEq    sodium acetate  50 mEq    potassium chloride  30 mEq    calcium gluconate  5 mEq    magnesium sulfate  10 mEq       Medications/Additives PER DAY trace elements (MTE-4 Conc)  1 mL    selenium  60 mcg    multiple vitamin (adult)  10 mL    insulin regular  20 Units       RX Fee    ADULT TPN FEE (RX USE ONLY)  1,992 mL       Energy Contribution    Proteins  478 kcal    Dextrose  1,218.56 kcal    Lipids  --    Total  1,696.56 kcal       Electrolyte Ion Calculated Amount    Sodium  100 mEq    Potassium  30 mEq    Calcium  5 mEq    Magnesium  10 mEq    Aluminum  --    Phosphate  --    Chloride  127.8 mEq    Acetate  155.16 mEq       Other    Total Protein  119.5 g    Total Protein/kg  1.23 g/kg    Glucose Infusion Rate  2.57 mg/kg/min    Osmolarity  --    Volume  1,992 mL    Rate  83 mL/hr    Dosing Weight  97 kg (Order-Specific)    Infusion Site  [facility]               Initiate TPN as ordered above.  TPN will provide 1700 kcal/day (18kcal/kg), 120 gm Protein/day (1.2gm/kg).  Pharmacy will monitor the patient's clinical progress and adjust parenteral nutrition electrolytes and additives as needed.       Antoine Primas, PharmD, BCCCP, BCPS 03/01/2017, 11:39 AM

## 2017-03-01 NOTE — Other
Patients Clinical Goal:   Clinical Goal(s) for the Shift: 1.) Maintain hemodynamic stability., keep MAP > 65. 2.) Monitor respiratory status, 3.) Safety and comfort,  Identify possible barriers to advancing the care plan: Agitation  Stability of the patient: Moderately Unstable - medium risk of patient condition declining or worsening    End of Shift Summary:     1.) Blood pressure was unstable at the beginning of the shift requiring uptitration of Levophed from 0.08 to as high as 0.25 mcg/kg/min. Discussed during rounds and Vasopressin was ordered to start per order. Albumin challenge also administered as ordered as patient appeared intravascularly depleted per MICU. Levophed gtt down titrated thereafter keeping MAP > 65, currently at 0.04 mcg/kg/min.    2.) Respiratory status improved after initiating ARDS net protocol. SpO2 on monitor > 95% and FiO2 decreased to 70% by RT per protocol. Minimal secretions and weak to moderate cough. Notable for cuff leak in AM which improved after RT inflated cuff.     3.) Fentanyl, Precedex and Propofol for comfort and analgesia. Bilateral wrist restraints for safety. Family updated on plan of care.

## 2017-03-01 NOTE — Progress Notes
.  MICU PROGRESS NOTE    PATIENT: Ryan Shepard       MRN: [id number]      DATE OF SERVICE: 03/01/2017         HOSPITAL DAY: 6  PRIMARY CARE PROVIDER: Sheppard Coil, MD  ATTENDING: Eliane Decree., MD, PhD    ID: Ryan Shepard???is a 77 y.o.???male???with a history of COPD (on 4LNC), HFpEF, afib, HTN, DM2, PAD s/p femoral graft, prior prostate CA who was admitted to the ICU for hypoxemic respiratory failure requiring BiPAP.    -- Lines/Tubes --   ETT (size 8.17mm), CVC 3-lumen L fem, A-line, NGT  -- Drips --  Fent, Precedex, propofol, levophed, d/c'd vasopressin  -- Antimicrobials --  Zosyn, Levofloxacin  ???    IE/SUBJECTIVE:   IE:  - Febrile overnight Tmax 38.3, blood cx obtained  - Received IV lasix x 40 ON with improved UOP  - Plan for liver bx with IR today   - No additional cuff leak, stable vent settings, ARDSnet protocol  - BAL Cx from 12/13 +nocardia    Subjective:   - intubated and sedated. Unable to assess    OBJECTIVE:   VITALS:  Temp:  [35.8 ???C (96.4 ???F)-38.3 ???C (100.9 ???F)] 37.8 ???C (100 ???F)  Heart Rate:  [72-108] 75  Resp:  [13-23] 22  BP: (66-144)/(46-73) 128/70  NBP Mean:  [54-91] 86  Arterial Line BP (mmHg): (54-138)/(38-73) 116/55  MAP:  [45 mmHg-90 mmHg] 76 mmHg  FiO2 (%):  [60 %-80 %] 60 %  SpO2:  [87 %-97 %] 92 %  01/02 0701 - 01/03 0700  In: 2587.9 [I.V.:1707.9]  Out: 1369 [Urine:1369]     Wt Readings from Last 1 Encounters:   02/28/17 (!) 102.2 kg (225 lb 5 oz)     Recent Labs      03/01/17   0412  03/01/17   0058  02/28/17   2008  02/28/17   1611   GLUCOSEPOC  199*  186*  195*  165*   }  Oxygen Therapy  SpO2: 92 %  O2 Device: ETT, Mechanical Ventilator  FiO2 (%): 60 %  O2 Flow Rate (L/min): 40 L/min     Vent Mode: VC  FiO2 (%):  [60 %-80 %] 60 %  Rate:  [22] 22  VT (mL):  [480 mL] 480 mL  PEEP (setting):  [10 cmH2O-14 cmH2O] 10 cmH2O    PHYSICAL EXAM:  Unchanged from yest  General - Intubated/Sedated  Neuro - Sedated, +gag, pupils reactive Cardiovascular - RRR no m/r/g, unable to assess JVP d/t girth, 2+ radial pulses b/l  Lungs - No audible cuff leak, no wheezing anteriorly, mechanical breath sounds  Skin - No visible rashes or lesions  Abdomen - Central obesity, soft, NT/ND  Extremeties - Warm and well-perfused. No LE edema.    LABORATORY DATA:   BMP  Recent Labs      03/01/17   0409  02/28/17   1614  02/28/17   0350   NA  134*  131*  131*   K  3.9  4.3  4.2   CL  94*  92*  91*   CO2  21  22  21    BUN  21  19  18    CREAT  1.28  1.43*  1.43*   CALCIUM  8.6  8.3*  8.3*   MG  2.0*  2.1*  2.1*     CBC  Recent Labs  03/01/17   0409  02/28/17   1614  02/28/17   0350   WBC  7.93  10.30*  11.33*   HGB  10.7*  10.9*  11.8*   HCT  32.1*  32.7*  34.1*   MCV  85.8  87.9  86.8   PLT  186  195  221     Coags  Recent Labs      02/28/17   0351  02/27/17   2353  02/27/17   1614   APTT  42.5*  41.8*  >180.0*     Blood gas  Recent Labs      03/01/17   0409   PHART  7.38   PCO2ART  39   PO2ART  92*   BICARBART  22.5   BEART  -2       Micro:    Microbiology:   12/31 BAL cx - neg or NGTD  12/28 Resp Cx Probable C.albicans  12/28 Blood Cx NTD  12/28 RVP neg  12/18 Resp Cx Neg (C albicans)  12/13 BAL Cx Neg  12/13 respiratory culture: candida, therwise negative so far; AFB smear negative x 2 (AFB cultures NTD)  12/13 legionella culture neg  Aspergillus Ag EIA - ???<0.50  RVP panel negative  Aspergillus fumigatus (M3) IgE - NEG   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  HIV - neg  Legionella Urinary Ag - neg  Chlamydia/gonorrhea PCR - neg  RPR nonreactive    IMAGING:     CTA chest (1/1):   IMPRESSION:  1. No pulmonary embolus to the segmental level excluding the right lower lobe segmental arteries, where images are motion degraded. 2. Unchanged appearance of coalescent airspace and nodular consolidation, most prominent in the left upper lobe and lingula, as well as confluent mediastinal lymphadenopathy.  3. Reflux of contrast into the hepatic veins, suggesting elevated right heart pressures.  4. Interval increase in size of moderate left pleural effusion.  5. Enlarged main pulmonary artery, which may be seen in pulmonary arterial hypertension.    TTE (12/28):  CONCLUSIONS:   1. Small LV size, moderate septal hypertrophy, normal wall motion and hyperdynamic systolic function.   2. Left ventricular ejection fraction is approximately >75%.   3. Mild LV diastolic dysfunction (grade I, impaired relaxation).   4. Normal right ventricular size and normal systolic function. (TAPSE 2.6 cm, DTI 27.0 cm/s).   5. Mild tricuspid valve regurgitation with estimated pulmonary artery systolic pressure of 51 mm Hg + CVP/JVP (IVC assessment inaccurate in setting of mechanical ventilation).   6. Small anterior pericardial effusion, measuring up to 8 mm. No echocardiographic evidence of tamponade.   7. Compared to TTE performed 02/01/2017, no significant changes noted with LV and RV function. Pericardial effusion is new.      ASSESSMENT & PLAN:   Ryan Shepard???is a 77 y.o.???male???with a history of COPD (on 3LNC), HFpEF, afib, HTN, DM2, PAD s/p femoral graft, prior prostate CA who was admitted for hypoxemic respiratory failure requiring BiPAP.    RESP:  #Acute on chronic hypoxemic respiratory failure  Admitted from clinic requiring BiPAP in setting of decreased arterial PaO2 and desats to low 80s. Recently admitted w/ bronch showing chronic colonization with aspergillus. Has features c/f malignancy on imaging. Respiratory  status continues to worsen with worsened xray. S/p upsized ETT on 1/1 w/ resolution of cuff leak. Initial c/f PE, CTA read no PE so d/c'd heparin gtt, LE dopplers negative for DVT.   - ARDS NET protocol  - Serial ABGs - Malignancy work-up, as below  - Antibiotics, as below  ???  #Chronic COPD  Increased sputum production x 2 months, slight wheezing and recently treated with steroids while hospitalized. Current presentation less c/w COPD exacerbation.  - Duonebs q6h  - Inhaled Flovent BID  - Empiric abx, as below  ???  CV:  #Hypovolemic shock in setting of sedation  Patient requires significant sedation d/t air leak on cuff of ETT, patient changing position can increase air leak. Hypotension requiring pressors in setting of sedation but elevated lactate and signs of end-organ damage including rising Cr. Patient has leukocytosis and on empiric abx, no other signs of infection.  - A-line for accurate BP monitoring  - Pressors: levophed, vasopressin  - Cont hydrocortisone and fludrocortisone (1/2- )  - Trend lactate  - Goal MAPs > 60 on A-line    #HFpEF  Home dose lasix PO 40mg  BID. TTE shows hyperdynamic EF >75% and elevated PASP to  - S/p albumin x 1 on 1/2  - Spot dose IV lasix to maintain goal net -500 to -1L over 24h  - BID lytes, daily weights, strict I&O's     #Afib  Rate-controlled. CHADS2VaSC 3-4  - Hold home carvedilol 25 mg BID  - Hold home apixaban 5 mg BID    NEURO:  #Intubated and sedated 2/2 hypoxemic respiratory failure  - Cont sedation with fentanyl/precedex/propofol    ID:  #Leukocytosis, improving  Afebrile. Increased sputum production x 2 months otherwise no localizing signs of infection. Did receive course of methylprednisolone 12/18-12/23 on recent hospitalization. 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  HEME:  #Hypervascular Liver Lesions/Mediastinal LAD- c/f malignancy, hx prostate cancer treated with total prostatectomy. Current presentation more amenable to IR biopsy of liver lesions, EBUS less given accessibility of lung lesions.  - IR liver biopsy done 1/3, f/u on path  ???  CHRONIC  #HTN  - Hold home BP meds in setting of shock: nifedipine 30mg  daily, losartan 100 mg daily, carvedilol 25 mg BID  ???  #Chronic???pain   Localized to lower back and lower extremities  - Hold home Norco 5-325 q6h PRN while sedated  ???  #GERD chronic/stable  - Pantoprazole PO 40 mg daily 12/31  ???  #Depression/Anxiety  Mood stable, no SI, no anxiety  - Hold home buspirone 15 mg BID, duloxetine DR 60 while sedated  ???  #PAD  s/p femoral-femoral graft 2009.  - Hole home clopidogrel 75 mg daily  ???  #HLD chronic/stable  - Hold home Pravastatin 40 mg qhs  ???  #DM2 with associated diabetic peripheral neuropathy  - Hold insulin while NPO  - Hole home gabapentin 800 mg TID   ???  F - TPN started 1/3, consult to pharmacy to double concentrate all  drips  A - Sedated  S - Fent/precedex/propofol  T - SQ heparin  H - 30 degrees  U - PPI BID  G - NPO  B - Senna qhs PRN  I - ETT 8.78mm (upsided 1/1), PIV 22g R hand (12/28), CVC 3-Lumen L fem (1/1), A-line (12/31), OGT (12/31), PIV x 1 L hand    Disposition: ICU level care required for hypoxemic respiratory failure requiring intubation  ???  Code Status: Full Code  Contact:  Primary Emergency ContactKendrik, [place], Home Phone: [phone]    Discussed with attending: Eliane Decree., MD, PhD    Jess Barters, PGY-1  [phone]    Pt seen, evaluated and examined with housestaff.  Please refer to my note for my impression and plan.    Eliane Decree MD PhD  Associate Clinical Professor of Medicine  Pulmonary & Critical Care Medicine  [employer] at [facility]

## 2017-03-01 NOTE — Nursing Note
Dr Collie Siad performing pre procedure [place] at bedside

## 2017-03-01 NOTE — Consults
Nutrition     Notified by Mitzi Hansen, PharmD, TPN to start today, per MD request 2/2 high aspiration risk.   Discussed on morning lightening rounds and with RN.     NPO with OGT to suction with no output noted.   Labs reviewed.  Glucose 155-212.    Using admit wt: 97kg  2400-2900cal (25-30cal/kg)  125-145gm pro (1.3-1.5gm/kg)    Recommend start with D18 6%AA 35ml/hr + 359ml lipid daily.  Prov: 120gm pro (1.2gm/kg)            2404cal (25cal/kg)  Needs insulin in TPN - per PharmD.  Check Tg after first lipid infusion.  Start TF as soon as possible with Glucerna 1.2 goal 73ml/hr.    Gerre Couch, MS, RD pg. [phone]

## 2017-03-01 NOTE — Progress Notes
INFECTIOUS DISEASES PROGRESS NOTE    Patient: Ryan Shepard  MRN: [id number]  DOB: 03/29/1940  Date of Service: 03/01/2017  Requesting Physician: Eliane Decree., MD, PhD  Reason for Consultation: R/o Aspergillus Pneumonia    Chief Complaint: Hypoxia    History of Present Illness:   Mr Ryan Shepard is a 77 y/o M with COPD, tobacco use disorder, PAD s/p femoral graft, HTN, DM2, HLD, Afib, GERD, depression, anxiety, chronic pain readmitted on 12/28 following discharge to home on 12/26 with recurrence of dyspnea.     Recent hospital course as follows:  12/6: Admitted with a COPD exacerbation in the setting of diagnosed COPD and extensive tobacco use disorder. CXR on admission showing confluent airspace opacities c/f multifocal aspiration/pneumonia. Procal neg. Treated with levaquin (12/6- ) x 7 days and 5 day course of prednisone.   12/11: Patient was planned for discharge but then was found on bacterial resp culture from admission (12/6) to have aspergillus species, few probably candida. Other micro including legionella urine ag neg, MRSA nares neg, HIV neg. Had CT chest done showing airspace and nodular consolidation within the left upper lobe and lingula and lesser within the right upper and right lower lobes consistent with pneumonia, likely fungal. Also with bulky intrathoracic multicompartamental lymphadenopathy. 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 Started on acyclovir 800 mg BID x 5 days. Gonorrhea/chlamydia neg. RPR pending. Bacterial culture no growth to date. HIV neg.   12/12: VSS, no other acute changes. Requiring 3 L NC. No changes in respiratory status.   12/13: VSS. No acute changes. Pulmonology consulted, recommended bronch,  performed same day. Pt states his SOB has improved drastically. No fevers, chills, nausea, vomiting. No new culture data. 12/14: Events noted; the patient became increasingly hypoxic after the bronchoscopy despite using a 15 liter face mask. The blood also noted hypercapnia.  According to the notes the patient reported worsening cough associated blood tinged sputum, but today he claims there was no change in his breath or cough. He was transferred to ICU for further monitoring and started on voriconazole, piperacillin-tazobactam and vancomycin. He denied fever, chills chest pain or changes in his breathing.  12/15: Remains AF with normal WBC. Reports he is coughing more today but breathing feels better.   12/16: Transferred to the floor, AF  12/23: Completed 14 day course of antibiotic treatment (Vanco/Zosyn/Vori x 3 days; Ceftriaxone/Flagyl x 6 days; Vanco/Zosyn x 5 days)  12/26: Discharged home  12/28: Seen in Medicine clinic for routine follow-up, noted to have O2Sat in low 70's on 4L NC, readmitted to hospital with BiPap treatment, Vanco/Zosyn/Azithromycin restarted      Hospital Course (Key Events): Date of Admission 2017-03-23   12/28: Admitted  12/31: On Bipap this AM, continued productive cough. Intubated.  1/1: BAL w/ thick mucous, therapeutic suctioning  1/2: AF, intubated, FiO2 80%, PEEP 10, levo at 0.18. WBC 11 from 13s, lactate still elevated 28. Pt sedated and non-responsive. Transitioned to ARDSnet protocol. Started levofloxacin. Started hydrocort and fludrocort.    1/3: Tmax 38.3 yesterday, FiO2 improved to 60%, PEEP still 10, WBC 7 from 10, Cr 1.2 from 1.4, levophed off and on vaso 2.4. Intubated, sedated.    Pertinent antimicrobial courses:  Zosyn 12/28-  Levofloxacin 1/2-    Azithromycin 12/28-1/2  Vancomycin 12/28-30    Review of Systems:  Unable to perform as pt intubated  Past Medical History:  Past Medical History:   Diagnosis Date   ??? Cancer (HCC/RAF)    ??? Diabetes (HCC/RAF)    ??? GERD (gastroesophageal reflux disease)    ??? Hypercholesteremia    ??? Hypertension    ??? PAD (peripheral artery disease) (HCC/RAF) ??? 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 Prostate disease    ??? Scoliosis     adolesent   ??? Vascular disease       Allergies:   No Known Allergies    Medications:  Scheduled Meds:  ??? albuterol  2.5 mg Nebulization Q6H   ??? chlorhexidine  10 mL Mouth/Throat BID   ??? fludrocortisone  0.05 mg Oral Daily   ??? fluticasone  1 puff Inhalation BID   ??? heparin  5,000 Units Subcutaneous BID   ??? hydrocortisone IV  50 mg Intravenous Q6H   ??? ipratropium  0.5 mg Nebulization Q6H   ??? levoFLOXacin  750 mg Intravenous Q48H   ??? lidocaine PF  30 mL Endotracheal Once   ??? pantoprazole  40 mg IV Push Q24H   ??? piperacillin/tazobactam  3.375 g Intravenous Q8H     Continuous Infusions:  ??? dexmedetomidine 4 mcg/mL drip 0.7 mcg/kg/hr (03/01/17 0700)   ??? fentaNYL 2.5 mg/100 mL drip 200 mcg/hr (03/01/17 0700)   ??? norepinephrine drip Stopped (03/01/17 1610)   ??? phenylephrine drip 10 mg/250 mL (peripheral) Stopped (02/27/17 1220)   ??? propofol drip 32 mcg/kg/min (03/01/17 0700)   ??? sodium chloride 5 mL/hr (02/26/17 0041)   ??? sodium chloride 5 mL/hr (02/27/17 1610)   ??? vasopressin drip 2.4 Units/hr (03/01/17 0700)     PRN Meds:.acetaminophen, insulin aspart **AND** dextrose, oxyCODONE, senna      Physical Exam:  Temp:  [35.8 ???C (96.4 ???F)-38.3 ???C (100.9 ???F)] 37.8 ???C (100 ???F)  Heart Rate:  [72-108] 74  Resp:  [13-23] 22  BP: (78-144)/(59-73) 128/70  NBP Mean:  [66-91] 86  Arterial Line BP (mmHg): (63-138)/(43-73) 129/58  MAP:  [52 mmHg-90 mmHg] 83 mmHg  FiO2 (%):  [60 %-80 %] 60 %  SpO2:  [89 %-97 %] 92 %  Temp (24hrs), Avg:37.6 ???C (99.6 ???F), Min:35.8 ???C (96.4 ???F), Max:38.3 ???C (100.9 ???F)    Intake and Output:   Last Two Completed Shifts:  I/O last 2 completed shifts:  In: 2646.1 [I.V.:1766.1; Other:80; IV Piggyback:800]  Out: 1409 [Urine:1409]  Vitals:    02/28/17 0500   Weight: (!) 102.2 kg (225 lb 5 oz)   Height:      Gen: Older AA man, intubated, sedated  HEENT: Sclerae anicteric, ETT in place Neck: Soft, supple without lymphadenopathy.  CV: Regular rate, normal S1/S2. No murmurs appreciated.   Chest: Slightly rhonchorous BL anteriorly w/ vent sounds  Abdomen: Soft, non-distended, non-tender, normoactive bowel sounds  Musculoskeletal: No clubbing, cyanosis, or edema.   Neuro: Sedated    Labs:  Lab Results   Component Value Date    WBC 7.93 03/01/2017    HGB 10.7 (L) 03/01/2017    HCT 32.1 (L) 03/01/2017    MCV 85.8 03/01/2017    PLT 186 03/01/2017     Lab Results   Component Value Date    CREAT 1.28 03/01/2017    BUN 21 03/01/2017    NA 134 (L) 03/01/2017    K 3.9 03/01/2017    CL 94 (L) 03/01/2017    CO2 21 03/01/2017     Lab Results   Component Value  Date    ALT 18 02/08/2017    AST 20 02/08/2017    ALKPHOS 50 02/08/2017    BILITOT 0.4 02/08/2017       Microbiology:   1/3 Resp cx pending  1/2 BCx x2 pending  1/2 UA neg  12/31 BAL cx - neg or NGTD  12/28 Resp Cx Probable C.albicans  12/28 Blood Cx NTD  12/28 RVP neg  12/18 Resp Cx Neg (C albicans)  12/13 BAL Cx Neg  12/13 respiratory culture: candida, therwise negative so far; AFB smear negative x 2 (AFB cultures NTD)  12/13 legionella culture neg  Aspergillus Ag EIA -  <0.50  RVP panel negative  Aspergillus fumigatus (M3) IgE - NEG   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  HIV - neg  Legionella Urinary Ag - neg  Chlamydia/gonorrhea PCR - neg  RPR nonreactive    Imaging Reviewed by Me:   1/3 CXR: My read - ETT in place, BL airspace disease, perhaps slightly worse from prior    12/28 CXR  Unchanged enlarged cardiomediastinal silhouette from combination of mediastinal adenopathy and left hilar mass extending into upper lobe, and lower lobe with left lymphangitic carcinomatosis. Unchanged moderate left pleural effusion.    12/20 CT Chest: 1.   Redemonstration of coalescent airspace and nodular consolidation, most prominent in the left upper lobe and lingula with peripheral groundglass associated with Unchanged to slightly more prominent mediastinal and bilateral hilar lymphadenopathy.   This finding is nonspecific but concerning for malignancy versus pneumonia with etiologies including fungal infection.   2.  Increase in size of pleural and pericardial effusions and anasarca consistent with volume overload.  3.   Dilated main pulmonary artery measuring 39 mm reflecting pulmonary arterial hypertension.  4.  Severe coronary artery calcifications.    12/20 CT A/P:  History of prostate cancer treated with total prostatectomy with at least 4 hypervascular hepatic masses measuring up to 5 cm in the right hepatic lobe, suspicious for metastatic disease. No abdominal or pelvic lymphadenopathy.  Severe atherosclerotic disease with thrombosed left common, external, and femoral artery, and patent femorofemoral bypass.      Assessment:   77yo M w/ h/o COPD, prostate CA with possible metastatic disease, recent admission for dyspnea treated with 14 days antibiotics, now readmitted with hypoxia but pt comfortable on exam. ID consulted for further w/u and management.    Pt initially w/ severe hypoxia on supplemental O2, diffuse nodular opacities in lungs, and continued production of purulent sputum, requiring intubation and ARDSnet protocol. Recurrent hypoxia likely secondary to a persistent or recurrent bacterial infection secondary to obstructive pulmonary process (COPD +/- lung metastases). Treating empirically for recurrent PNA. S/p 5 days of azithromycin. Continuing on empiric pip-tazo. Started on levofloxacin 1/2.    Although concern for possible aspergillus pneumonia is reasonable in this patient with persistent lung disease and imaging consistent with fungal infection, only A [place] (not a common cause of pulmonary disease) has been isolated on a single expectorated sputum sample, with no growth on multiple other respiratory cultures, including a BAL culture, and negative galactomannan assay (while on Zosyn).     Problem List:  - COPD  - Prostate cancer with possible metastatic disease  - Hypoxia  - Bacterial pneumonia (unspecified)    -  Precautions: No infectious isolation    Recommendations/Plan:   - Cont pip-tazo for pneumonia (12/28- )  - Cont empiric levofloxacin 750mg  IV daily (1/2- )  - F/u resp cx, including AFB cx from 12/13  - F/u urine histo Ag  - Cont to hold off on antifungal coverage    Thank you for this consultation. We will continue to follow with you. Please page [phone] (General ID) with any questions.  DWA Dr. Chestine Spore  Recommendations d/w ICU team.    Author:  Jeanne Ivan, MD  Infectious Disease Fellow, PGY-4  03/01/2017  8:16 AM

## 2017-03-01 NOTE — Progress Notes
MICU Attending Note    Pt Name:  Ryan Shepard  MRN: [id number]  Date: 02/28/2017  Time of initial visit: 1155      Remains intubated.  Requiring vasoactive support.      Temp:  [35.8 ???C (96.4 ???F)-36.9 ???C (98.4 ???F)] 36.4 ???C (97.5 ???F)  Heart Rate:  [89-108] 94  Resp:  [15-25] 22  BP: (66-144)/(46-73) 128/70  NBP Mean:  [54-91] 86  Arterial Line BP (mmHg): (54-150)/(38-147) 102/59  MAP:  [45 mmHg-148 mmHg] 75 mmHg  FiO2 (%):  [70 %-80 %] 70 %  SpO2:  [87 %-97 %] 96 %     Intake/Output Summary (Last 24 hours) at 02/28/17 1753  Last data filed at 02/28/17 1700   Gross per 24 hour   Intake          3383.02 ml   Output              610 ml   Net          2773.02 ml       Vent Mode: VC  FiO2 (%):  [70 %-80 %] 70 %  Rate:  [22-25] 22  VT (mL):  [480 mL] 480 mL  PEEP (setting):  [10 cmH2O-14 cmH2O] 14 cmH2O     PHYSICAL EXAM  General: Intubtad  HEENT: NC/AT, +ETT  Neck: No TM, No LAN  Lungs: Coarse breath sounds bilaterally  CV: Tachycardic  Abd: Hypoactive bowel sounds, nontender  Ext: No C/C/E.  1+ pulses with good capillary refill.  Normal skin coloration  Neuro: Sedated      Chemistries  Recent Labs      02/28/17   1614  02/28/17   0350  02/27/17   1614   02/26/17   0125   NA  131*  131*  127*   < >  135   K  4.3  4.2  4.3   < >  4.2   CL  92*  91*  90*   < >  93*   CO2  22  21  21    < >  25   BUN  19  18  16    < >  11   CREAT  1.43*  1.43*  1.41*   < >  0.94   GLUCOSE  155*  147*  200*   < >  124*   CALCIUM  8.3*  8.3*  8.5*   < >  8.6   MG  2.1*  2.1*  2.1*   < >  1.8   PHOS   --    --    --    --   3.4    < > = values in this interval not displayed.     LFTs  No results for input(s): AST, ALT, ALKPHOS, GGT, BILITOT, BILICON, TOTPRO, ALBUMIN in the last 72 hours.  Hematology  Recent Labs      02/28/17   1614  02/28/17   0350  02/27/17   1614   WBC  10.30*  11.33*  13.75*   HGB  10.9*  11.8*  12.9*   PLT  195  221  256     Coagulation profile  Recent Labs      02/28/17   0351  02/27/17   2353  02/27/17   1614 APTT  42.5*  41.8*  >180.0*       Lab Results   Component Value Date    PHART 7.37 02/28/2017  PHART 7.36 (@) 01/14/2008    PO2ART 138 (H) 02/28/2017    PO2ART 77 (@) 01/14/2008    PCO2ART 41 02/28/2017    PCO2ART 50 (@) 01/14/2008    BICARBART 22.8 02/28/2017    BICARBART 27.6 (@) 01/14/2008    BEART -2 02/28/2017    BEART 2 01/14/2008       CXR - Left sided opacity    CTPA -  1. No pulmonary embolus to the segmental level excluding the right lower lobe segmental arteries, where images are motion degraded.  2. Unchanged appearance of bilateral coalescent airspace and nodular consolidation, most prominent in the left upper lobe and lingula, as well as confluent mediastinal lymphadenopathy.  3. Reflux of contrast into the hepatic veins, suggesting elevated right heart pressures.  4. Interval increase in size of moderate left pleural effusion.  5. Enlarged main pulmonary artery, which may be seen in pulmonary arterial hypertension.      A/P -     1) Bilateral L>R pneumonia - antibiotics  2) Septic shock - due to #1     - vasoactive support     - hydrocortisone 50 mg IV q6 + fludrocortisone 50 mcg via feeding tube qDay  3) ARDS - due to #1 and #2  4) Acute hypoxic respiratory failure - due to #1, #2, #3     - lung protective ventiilation     - HOB 30-45 and oral care     - SATs and SBTs  5) Prostate ca  6) Mediastinal LAN and liver masses - biopsy of liver masses (due to #5?)  7) DVT/GI prophylaxis  8) TPN as per NUTRIREA-2  9) Physical therapy and mobilization when appropriate      Pt discussed, seen and examined with housestaff.  Please see their notes for further details.  Critical Care Time provided = 45 minutes,  exclusive of procedures.      Eliane Decree MD PhD  Associate Clinical Professor of Medicine  Pulmonary & Critical Care Medicine  [employer] at [facility]

## 2017-03-01 NOTE — Consults
INPATIENT CONSULTATION/ INTERVENTIONAL RADIOLOGY     PATIENT:  Ryan Shepard  MRN:  [id number]  DOB:  Nov 06, 1940  DATE OF SERVICE:  03/01/2017    REFERRING PHYSICIAN/ SERVICE:  Eliane Decree., MD, PhD    INTERVENTIONAL RADIOLOGY ATTENDING: Masamed, Rinat, MD    3 Days Post-Op   LOS: 6 days     ADMISSION DIAGNOSIS: Shortness of breath [R06.02]  Acute hypoxemic respiratory failure (HCC/RAF) [J96.01]    REASON FOR CONSULTATION: Right liver lobe lesion    HPI:  Ryan Shepard is a  77 y.o. male with past medical history of COPD, HFpEF, atrial fibrillation, hypertension, diabetes mellitus type 2, PAD, prostate cancer who was admitted on 12/29 for hypoxemic respiratory failure requiring BiPAP.   On 12/21, CT A/P showed  4 hypervascular hepatic masses in the right hepatic lobe suspicious for metastatic disease.  He is currently requiring ICU monitoring with sedation and a ventilator.  IR was consulted for image guided liver lesion biopsy.    ALLERGIES:   No Known Allergies    MEDICATIONS:  Current Facility-Administered Medications   Medication Dose Route Frequency   ??? acetaminophen tab 650 mg  650 mg Oral Q4H PRN   ??? [COMPLETED] albumin 25% inj 25 g  25 g Intravenous Once   ??? albuterol (2.5 mg/0.5 mL) 0.5% nebu soln 2.5 mg  2.5 mg Nebulization Q6H   ??? chlorhexidine 0.12% soln 10 mL  10 mL Mouth/Throat BID   ??? dexmedetomidine 1,000 mcg in sodium chloride 0.9% 250 mL drip  0.2 mcg/kg/hr (Dosing Weight) Intravenous Continuous   ??? insulin aspart (NovoLOG) 100 units/mL inj pen   Subcutaneous QAC & QHS PRN    And   ??? dextrose 50% inj 25 g  25 g IV Push PRN   ??? fentaNYL (PF) 2,500 mcg in sodium chloride 0.9% 100 mL drip  50 mcg/hr Intravenous Continuous   ??? fludrocortisone tab 0.05 mg  0.05 mg Oral Daily   ??? fluticasone 220 mcg/act inh 1 puff  1 puff Inhalation BID   ??? [COMPLETED] furosemide 10 mg/mL inj 40 mg  40 mg IV Push Once   ??? [COMPLETED] furosemide 10 mg/mL inj 40 mg  40 mg Intravenous Once ??? [START ON 03/02/2017] heparin 5000 unit/mL inj 5,000 Units  5,000 Units Subcutaneous BID   ??? hydrocortisone inj 50 mg  50 mg Intravenous Q6H   ??? ipratropium 0.02% nebu soln 500 mcg  0.5 mg Nebulization Q6H   ??? levoFLOXacin 750 mg in dextrose 5% 150 mL IVPB RTU  750 mg Intravenous Q48H   ??? lidocaine PF 1% inj 30 mL  30 mL Endotracheal Once   ??? norepinephrine 8 mg in sodium chloride 0.9% 250 mL drip  0.1-1 mcg/kg/min (Dosing Weight) Intravenous Continuous   ??? oxyCODONE tab 10 mg  10 mg Oral Q6H PRN   ??? pantoprazole inj 40 mg  40 mg IV Push Q24H   ??? phenylephrine 10 mg in dextrose 5% 250 mL drip  40-200 mcg/min Intravenous Continuous   ??? piperacillin/tazobactam 3.375 g in dextrose 5% 50 mL IVPB RTU  3.375 g Intravenous Q8H   ??? propofol 10 mg/mL drip  75 mcg/kg/min (Dosing Weight) Intravenous Continuous   ??? senna tab 1 tablet  1 tablet Oral QHS PRN   ??? [COMPLETED] sodium chloride 0.9% IV soln bolus 400 mL  400 mL Intravenous Once   ??? sodium chloride 0.9% IV soln  5-10 mL/hr Intravenous Continuous   ??? sodium chloride 0.9% IV soln  5-10 mL/hr Intravenous Continuous   ??? [DISCONTINUED] acetaminophen tab 500 mg  500 mg Oral Q4H PRN   ??? [DISCONTINUED] azithromycin 500 mg in dextrose 5% 250 mL IVPB  500 mg Intravenous Q24H   ??? [DISCONTINUED] busPIRone tab 15 mg  15 mg Oral BID   ??? [DISCONTINUED] DULoxetine DR cap 60 mg  60 mg Oral Daily   ??? [DISCONTINUED] fludrocortisone tab 0.1 mg  0.1 mg Oral Daily   ??? [DISCONTINUED] gabapentin cap 800 mg  800 mg Oral TID   ??? [DISCONTINUED] heparin 5000 unit/mL inj 5,000 Units  5,000 Units Subcutaneous BID   ??? [DISCONTINUED] hydrocortisone inj 100 mg  100 mg Intravenous Q6H   ??? [DISCONTINUED] levoFLOXacin 250 mg in dextrose 5% 50 mL IVPB RTU  250 mg Intravenous Q24H   ??? [DISCONTINUED] levoFLOXacin 500 mg in dextrose 5% 100 mL IVPB RTU  500 mg Intravenous Once   ??? [DISCONTINUED] lidocaine 5% patch 2 patch  2 patch Transdermal Q24H ??? [DISCONTINUED] pantoprazole inj 40 mg  40 mg IV Push Q24H   ??? [DISCONTINUED] pravastatin tab 40 mg  40 mg Oral QHS   ??? [DISCONTINUED] vasopressin 20 Units in sodium chloride 0.9% 100 mL drip  1.4 Units/hr Intravenous Continuous       PAST MEDICAL HISTORY:   Past Medical History:   Diagnosis Date   ??? Cancer (HCC/RAF)    ??? Diabetes (HCC/RAF)    ??? GERD (gastroesophageal reflux disease)    ??? Hypercholesteremia    ??? Hypertension    ??? PAD (peripheral artery disease) (HCC/RAF)    ??? 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 Prostate disease    ??? Scoliosis     adolesent   ??? Vascular disease        PAST SURGICAL HISTORY:  Past Surgical History:   Procedure Laterality Date   ??? BACK SURGERY     ??? BILATERAL FEMORAL ARTERY EXPLORATION  2009   ??? BLADDER SURGERY  1999   ??? PROSTATE SURGERY  2000       FAMILY HISTORY:  Family History   Problem Relation Age of Onset   ??? Cancer Mother    ??? Cancer Father    ??? Malignant hyperthermia Neg Hx        SOCIAL HISTORY:  Social History   Substance Use Topics   ??? Smoking status: Never Smoker   ??? Smokeless tobacco: Never Used   ??? Alcohol use 0.0 oz/week       REVIEW OF SYSTEMS:   ROS limited due to patient's sedation.     PHYSICAL EXAM:  Vital Signs:  Temp:  [35.8 ???C (96.4 ???F)-38.3 ???C (100.9 ???F)] 38 ???C (100.4 ???F)  Heart Rate:  [71-104] 82  Resp:  [13-23] 19  BP: (78-149)/(59-70) 149/65  NBP Mean:  [66-86] 86  Arterial Line BP (mmHg): (63-146)/(44-73) 114/54  MAP:  [53 mmHg-93 mmHg] 76 mmHg  FiO2 (%):  [60 %-80 %] 70 %  SpO2:  [86 %-97 %] 90 %  Vitals:    02/28/17 0500   Weight: (!) 102.2 kg (225 lb 5 oz)   Height:       Vent Mode: VC  FiO2 (%):  [60 %-80 %] 70 %  Rate:  [18-22] 18  VT (mL):  [480 mL] 480 mL  PEEP (setting):  [9 cmH2O-14 cmH2O] 9 cmH2O  GENERAL: Comfortable, NAD.  HEENT: Face symmetric. ETT/OG tube in place.    CARDIOVASCULAR: Regular rate  and rhythm, no ectopy on monitor.  PUMONARY: - Normal respiratory excursions, not tachypneic, no labored breathing.  - Breath Sounds: Coarse breath sounds bilaterally  ABD: Non-distended, soft.  GU: Foley  EXT: WWP, no c/c.  Trace BLE edema.   NEURO: Sedated.  CENTRAL LINE:  Left groin CVC.  DRAINS: None    DETAILED I&O:  I/O last 3 completed shifts:  In: 3748.9 [I.V.:2863.9; Other:85; IV Piggyback:800]  Out: 1610 [RUEAV:4098; Emesis/GI output:20]    Intake/Output Summary (Last 24 hours) at 03/01/17 1115  Last data filed at 03/01/17 1000   Gross per 24 hour   Intake          2363.75 ml   Output             1519 ml   Net           844.75 ml      LAB REVIEW:    WBC/Hgb/Hct/Plts:  7.93/10.7/32.1/186 (01/03 0409)   Na/K/Cl/CO2/BUN/Cr/glu:  134/3.9/94/21/21/1.28/174 (01/03 0409)       Art pH/pCO2/pO2/HCO3:  7.35/42/62/22.5 (01/03 1191)        IMAGING:   02/15/2017 CT abd+pelvis w contrast  IMPRESSION:  History of prostate cancer treated with total prostatectomy with at least 4 hypervascular hepatic masses measuring up to 5 cm in the right hepatic lobe, suspicious for metastatic disease. No abdominal or pelvic lymphadenopathy.  Severe atherosclerotic disease with thrombosed left common, external, and femoral artery, and patent femorofemoral bypass.    ASSESSMENT:  Ryan Shepard is a 77 y.o. male with past medical history of COPD, HFpEF, atrial fibrillation, hypertension, diabetes mellitus type 2, PAD, prostate cancer who was admitted on 12/29 for hypoxemic respiratory failure requiring BiPAP.  CT A/P showed 4 hypervascular hepatic masses in the right hepatic lobe suspicious for metastatic disease given his history of prostate cancer.  ROS limited due to patient requiring sedation and ventilator.  IR was consulted for image guided liver lesion biopsy.  INR/PLT resulted prior to procedure.  This procedure has been fully reviewed with the patient's spouse and written informed consent has been obtained. The benefits and risks of the procedure were explained to the patient's spouse and all question were answered prior to consent being obtained.     The patient was examined and discussed with the Interventional Radiology Attending who agrees with assessment and plans listed above.    Author:  Irving Copas  10:40 AM 03/01/2017  Interventional Radiology  [facility]

## 2017-03-01 NOTE — Progress Notes
MICU Attending Note    Pt Name:  Ryan Shepard  MRN: [id number]  Date: 03/01/2017  Time of initial visit: 1005      Still intubated and vasoactives.      Temp:  [36.4 ???C (97.5 ???F)-38.3 ???C (100.9 ???F)] 37.6 ???C (99.7 ???F)  Heart Rate:  [71-96] 80  Resp:  [13-23] 17  BP: (149)/(65) 149/65  Arterial Line BP (mmHg): (82-146)/(44-73) 93/53  MAP:  [59 mmHg-93 mmHg] 69 mmHg  FiO2 (%):  [60 %-100 %] 70 %  SpO2:  [86 %-96 %] 94 %     Intake/Output Summary (Last 24 hours) at 03/01/17 1543  Last data filed at 03/01/17 1400   Gross per 24 hour   Intake          1694.29 ml   Output             1444 ml   Net           250.29 ml       Vent Mode: VC  FiO2 (%):  [60 %-100 %] 70 %  Rate:  [18-22] 18  VT (mL):  [480 mL] 480 mL  PEEP (setting):  [9 cmH2O-14 cmH2O] 10 cmH2O     PHYSICAL EXAM  General: Intubated  HEENT: NC/AT, +ETT  Neck: No TM, No LAN  Lungs: Coarse breath sounds bilaterally  CV: Regular rhythm  Abd: Hypoactive BS, nontender  Ext: No C/C/E.  1+ pulses.  Good capillary refill.  Normal skin color.  Neuro: Sedated      Chemistries  Recent Labs      03/01/17   0409  02/28/17   1614  02/28/17   0350   NA  134*  131*  131*   K  3.9  4.3  4.2   CL  94*  92*  91*   CO2  21  22  21    BUN  21  19  18    CREAT  1.28  1.43*  1.43*   GLUCOSE  174*  155*  147*   CALCIUM  8.6  8.3*  8.3*   MG  2.0*  2.1*  2.1*   PHOS  5.0*   --    --      LFTs  No results for input(s): AST, ALT, ALKPHOS, GGT, BILITOT, BILICON, TOTPRO, ALBUMIN in the last 72 hours.  Hematology  Recent Labs      03/01/17   0409  02/28/17   1614  02/28/17   0350   WBC  7.93  10.30*  11.33*   HGB  10.7*  10.9*  11.8*   PLT  186  195  221     Coagulation profile  Recent Labs      02/28/17   0351  02/27/17   2353  02/27/17   1614   APTT  42.5*  41.8*  >180.0*       Lab Results   Component Value Date    PHART 7.35 (L) 03/01/2017    PHART 7.36 (@) 01/14/2008    PO2ART 81 (L) 03/01/2017    PO2ART 77 (@) 01/14/2008    PCO2ART 42 03/01/2017    PCO2ART 50 (@) 01/14/2008 BICARBART 22.2 03/01/2017    BICARBART 27.6 (@) 01/14/2008    BEART -3 (L) 03/01/2017    BEART 2 01/14/2008       CXR - L>R opacities; PVC    A/P -     1) Bilateral L>R pneumonia - antibiotics  2) Septic  shock - due to #1     - vasoactive support     - hydrocortisone + fludrocortisone  3) ARDS - due to #1 and #2  4) Acute hypoxic respiratory failure - due to #1, #2, #3     - lung protective ventilation     - HOB 30-45 and oral care     - SATs and SBTs  5) Prostate ca  6) Mediastinal LAN & liver masses - awaiting biopsy of liver masses   7) DVT/GI prophylaxis  8) TPN as per NUTRIREA-2  9) Physical therapy and mobilization when appropriate      Pt discussed, seen and examined with housestaff.  Please see their notes for further details.  Critical Care Time provided = 35 minutes,  exclusive of procedures.      Eliane Decree MD PhD  Associate Clinical Professor of Medicine  Pulmonary & Critical Care Medicine  [employer] at [facility]

## 2017-03-02 ENCOUNTER — Ambulatory Visit: Payer: PRIVATE HEALTH INSURANCE

## 2017-03-02 ENCOUNTER — Ambulatory Visit: Payer: MEDICARE

## 2017-03-02 DIAGNOSIS — A439 Nocardiosis, unspecified: Secondary | ICD-10-CM

## 2017-03-02 LAB — Blood Gases, arterial
ABG INSPIRED O2: 60 mmol/L (ref 7.37–7.41)
ABG INSPIRED O2: 70 mmHg (ref 38–42)
BASE EXCESS: -1 mmol/L (ref ?–2)
PH: 7.32 mmol/L — ABNORMAL LOW (ref 7.37–7.41)
PH: 7.31 mmol/L — ABNORMAL LOW (ref 7.37–7.41)
PO2: 103 mmHg (ref 98–118)

## 2017-03-02 LAB — Basic Metabolic Panel
CHLORIDE: 93 mmol/L — ABNORMAL LOW (ref 96–106)
CREATININE: 1.36 mg/dL — ABNORMAL HIGH (ref 0.60–1.30)
GFR ESTIMATE FOR NON-AFRICAN AMERICAN: 52 mg/dL (ref 0.60–1.30)
POTASSIUM: 3 mmol/L — ABNORMAL LOW (ref 3.6–5.3)

## 2017-03-02 LAB — Legionella Culture: LEGIONELLA CULTURE: NEGATIVE

## 2017-03-02 LAB — Magnesium
MAGNESIUM: 2 meq/L — ABNORMAL HIGH (ref 1.4–1.9)
MAGNESIUM: 1.8 meq/L (ref 1.4–1.9)

## 2017-03-02 LAB — Blood Lactate: BLOOD LACTATE: 46 mg/dL — ABNORMAL HIGH (ref 5–25)

## 2017-03-02 LAB — Phosphorus: PHOSPHORUS: 3.4 mg/dL (ref 2.3–4.4)

## 2017-03-02 LAB — Cytology, Respiratory

## 2017-03-02 LAB — Flow Cytometry

## 2017-03-02 LAB — Glucose,POC
GLUCOSE,POC: 187 mg/dL — ABNORMAL HIGH (ref 65–99)
GLUCOSE,POC: 192 mg/dL — ABNORMAL HIGH (ref 65–99)
GLUCOSE,POC: 236 mg/dL — ABNORMAL HIGH (ref 65–99)
GLUCOSE,POC: 246 mg/dL — ABNORMAL HIGH (ref 65–99)
GLUCOSE,POC: 264 mg/dL — ABNORMAL HIGH (ref 65–99)
GLUCOSE,POC: 266 mg/dL — ABNORMAL HIGH (ref 65–99)

## 2017-03-02 LAB — Calcium,Ionized: IONIZED CA++,UNCORRECTED: 1.2 mmol/L (ref 1.09–1.29)

## 2017-03-02 LAB — Prealbumin: PREALBUMIN: 6.7 mg/dL — ABNORMAL LOW (ref 14.0–43.0)

## 2017-03-02 LAB — Bacterial Culture Respiratory

## 2017-03-02 LAB — CBC: RED BLOOD CELL COUNT: 3.56 x10E6/uL — ABNORMAL LOW (ref 4.41–5.95)

## 2017-03-02 LAB — Triglycerides: TRIGLYCERIDES: 156 mg/dL — ABNORMAL HIGH (ref <150)

## 2017-03-02 MED ADMIN — MAGNESIUM SULFATE 4 GM/100ML IV SOLN: 4 g | INTRAVENOUS | @ 17:00:00 | Stop: 2017-03-02 | NDC 63323010601

## 2017-03-02 MED ADMIN — ALBUTEROL SULFATE (5 MG/ML) 0.5% IN NEBU: 2.5 mg | RESPIRATORY_TRACT | @ 02:00:00 | Stop: 2017-03-07 | NDC 00487990130

## 2017-03-02 MED ADMIN — PANTOPRAZOLE SODIUM 40 MG IV SOLR: 40 mg | INTRAVENOUS | @ 08:00:00 | Stop: 2017-03-13 | NDC 55150020210

## 2017-03-02 MED ADMIN — FLUTICASONE PROPIONATE HFA 220 MCG/ACT IN AERO: 1 | RESPIRATORY_TRACT | @ 09:00:00 | Stop: 2017-03-02

## 2017-03-02 MED ADMIN — PROPOFOL INFUSION 10 MG/ML: 7755 ug/min | INTRAVENOUS | @ 22:00:00 | Stop: 2017-03-14 | NDC 63323026965

## 2017-03-02 MED ADMIN — IPRATROPIUM BROMIDE 0.02 % IN SOLN: 500 ug | RESPIRATORY_TRACT | @ 02:00:00 | Stop: 2017-03-07 | NDC 00487980101

## 2017-03-02 MED ADMIN — PROPOFOL INFUSION 10 MG/ML: 7755 ug/min | INTRAVENOUS | @ 01:00:00 | Stop: 2017-03-14 | NDC 63323026965

## 2017-03-02 MED ADMIN — HYDROCORTISONE NA SUCCINATE PF 100 MG IJ SOLR: 50 mg | INTRAVENOUS | @ 13:00:00 | Stop: 2017-03-05 | NDC 00009001104

## 2017-03-02 MED ADMIN — POTASSIUM CHLORIDE 20 MEQ/50ML RTU: 20 meq | INTRAVENOUS | @ 22:00:00 | Stop: 2017-03-02 | NDC 00338070341

## 2017-03-02 MED ADMIN — TPN (ADULT): 1992 mL | INTRAVENOUS | @ 07:00:00 | Stop: 2017-03-03 | NDC 63323036059

## 2017-03-02 MED ADMIN — COTRIMOXAZOLE 500 ML IVPB (<= 320 MG): 500 mg | INTRAVENOUS | @ 01:00:00 | Stop: 2017-03-03 | NDC 00703951403

## 2017-03-02 MED ADMIN — LEVOFLOXACIN 750 MG/150 ML RTU: 750 mg | INTRAVENOUS | @ 22:00:00 | Stop: 2017-03-03 | NDC 00143972024

## 2017-03-02 MED ADMIN — FUROSEMIDE 10 MG/ML IJ SOLN: 60 mg | INTRAVENOUS | @ 20:00:00 | Stop: 2017-03-03 | NDC 36000028425

## 2017-03-02 MED ADMIN — COTRIMOXAZOLE 500 ML IVPB (<= 320 MG): 500 mg | INTRAVENOUS | @ 08:00:00 | Stop: 2017-03-03 | NDC 00703951403

## 2017-03-02 MED ADMIN — CHLORHEXIDINE GLUCONATE 0.12 % MT SOLN: 10 mL | OROMUCOSAL | @ 05:00:00 | Stop: 2017-03-13 | NDC 52376002110

## 2017-03-02 MED ADMIN — FUROSEMIDE 10 MG/ML IJ SOLN: 40 mg | INTRAVENOUS | @ 07:00:00 | Stop: 2017-03-02 | NDC 36000028325

## 2017-03-02 MED ADMIN — ALBUTEROL SULFATE (5 MG/ML) 0.5% IN NEBU: 2.5 mg | RESPIRATORY_TRACT | @ 13:00:00 | Stop: 2017-03-07 | NDC 00487990130

## 2017-03-02 MED ADMIN — COTRIMOXAZOLE 500 ML IVPB (<= 320 MG): 500 mg | INTRAVENOUS | @ 20:00:00 | Stop: 2017-03-03 | NDC 00703951403

## 2017-03-02 MED ADMIN — HEPARIN SODIUM (PORCINE) 5000 UNIT/ML IJ SOLN: 5000 [IU] | SUBCUTANEOUS | @ 16:00:00 | Stop: 2017-03-06 | NDC 25021040201

## 2017-03-02 MED ADMIN — INSULIN ASPART 100 UNIT/ML SC SOPN: 3 mL | SUBCUTANEOUS | Stop: 2017-03-03 | NDC 00169633910

## 2017-03-02 MED ADMIN — IODIXANOL 320 MG/ML IV SOLN: 100 mL | INTRAVENOUS | @ 06:00:00 | Stop: 2017-03-02 | NDC 00407222317

## 2017-03-02 MED ADMIN — INSULIN ASPART 100 UNIT/ML SC SOPN: 3 mL | SUBCUTANEOUS | @ 04:00:00 | Stop: 2017-03-03 | NDC 00169633910

## 2017-03-02 MED ADMIN — POTASSIUM CHLORIDE 20 MEQ/15ML (10%) PO SOLN: 40 meq | OROGASTRIC | @ 17:00:00 | Stop: 2017-03-02 | NDC 66689004850

## 2017-03-02 MED ADMIN — POLYETHYLENE GLYCOL 3350 17 G PO PACK: 17 g | ORAL | Stop: 2017-03-06 | NDC 68084043098

## 2017-03-02 MED ADMIN — FENTANYL 2.5 MG/100 ML NS DRIP: 50 ug/h | INTRAVENOUS | @ 09:00:00 | Stop: 2017-03-13 | NDC 00409909461

## 2017-03-02 MED ADMIN — ALBUTEROL SULFATE (5 MG/ML) 0.5% IN NEBU: 2.5 mg | RESPIRATORY_TRACT | @ 20:00:00 | Stop: 2017-03-07 | NDC 00487990130

## 2017-03-02 MED ADMIN — MAGNESIUM SULFATE 2 GM/50ML IV SOLN: 2 g | INTRAVENOUS | @ 19:00:00 | Stop: 2017-03-02 | NDC 63323010605

## 2017-03-02 MED ADMIN — NOREPINEPHRINE 8, 16, 32 MG/250 ML DRIP: 10.34103.4 ug/min | INTRAVENOUS | @ 10:00:00 | Stop: 2017-03-06

## 2017-03-02 MED ADMIN — HYDROCORTISONE NA SUCCINATE PF 100 MG IJ SOLR: 50 mg | INTRAVENOUS | @ 08:00:00 | Stop: 2017-03-05 | NDC 00009001104

## 2017-03-02 MED ADMIN — PIPERACILLIN-TAZOBACTAM 3.375 GM/50 ML RTU (EXT 4HR): 3.375 g | INTRAVENOUS | Stop: 2017-03-02 | NDC 00206886102

## 2017-03-02 MED ADMIN — POTASSIUM CHLORIDE 20 MEQ/50ML RTU: 20 meq | INTRAVENOUS | @ 19:00:00 | Stop: 2017-03-02 | NDC 00338070341

## 2017-03-02 MED ADMIN — ALBUTEROL SULFATE (5 MG/ML) 0.5% IN NEBU: 2.5 mg | RESPIRATORY_TRACT | @ 09:00:00 | Stop: 2017-03-07 | NDC 00487990130

## 2017-03-02 MED ADMIN — INSULIN ASPART 100 UNIT/ML SC SOPN: 3 mL | SUBCUTANEOUS | @ 16:00:00 | Stop: 2017-03-03 | NDC 00169633910

## 2017-03-02 MED ADMIN — PROPOFOL INFUSION 10 MG/ML: 7755 ug/min | INTRAVENOUS | @ 09:00:00 | Stop: 2017-03-14 | NDC 63323026965

## 2017-03-02 MED ADMIN — DEXMEDETOMIDINE 4 MCG/ML DRIP: 20.68 ug/h | INTRAVENOUS | @ 12:00:00 | Stop: 2017-03-04 | NDC 55150020902

## 2017-03-02 MED ADMIN — NOREPINEPHRINE 8, 16, 32 MG/250 ML DRIP: 10.34103.4 ug/min | INTRAVENOUS | @ 02:00:00 | Stop: 2017-03-02

## 2017-03-02 MED ADMIN — PROPOFOL INFUSION 10 MG/ML: 7755 ug/min | INTRAVENOUS | @ 17:00:00 | Stop: 2017-03-14 | NDC 63323026965

## 2017-03-02 MED ADMIN — SENNOSIDES 8.6 MG PO TABS: 1 | ORAL | @ 05:00:00 | Stop: 2017-03-06 | NDC 00904652261

## 2017-03-02 MED ADMIN — PROPOFOL INFUSION 10 MG/ML: 7755 ug/min | INTRAVENOUS | @ 08:00:00 | Stop: 2017-03-14 | NDC 63323026965

## 2017-03-02 MED ADMIN — MAGNESIUM SULFATE 5-6 GM IVPB: 6 g | INTRAVENOUS | @ 17:00:00 | Stop: 2017-03-02

## 2017-03-02 MED ADMIN — INSULIN ASPART 100 UNIT/ML SC SOPN: 3 mL | SUBCUTANEOUS | @ 12:00:00 | Stop: 2017-03-03 | NDC 00169633910

## 2017-03-02 MED ADMIN — FENTANYL 2.5 MG/100 ML NS DRIP: 50 ug/h | INTRAVENOUS | @ 22:00:00 | Stop: 2017-03-13 | NDC 00409909461

## 2017-03-02 MED ADMIN — NOREPINEPHRINE 8, 16, 32 MG/250 ML DRIP: 10.34103.4 ug/min | INTRAVENOUS | Stop: 2017-03-02

## 2017-03-02 MED ADMIN — POLYETHYLENE GLYCOL 3350 17 G PO PACK: 17 g | ORAL | @ 16:00:00 | Stop: 2017-03-06

## 2017-03-02 MED ADMIN — IPRATROPIUM BROMIDE 0.02 % IN SOLN: 500 ug | RESPIRATORY_TRACT | @ 13:00:00 | Stop: 2017-03-07 | NDC 00487980101

## 2017-03-02 MED ADMIN — DEXMEDETOMIDINE 4 MCG/ML DRIP: 20.68 ug/h | INTRAVENOUS | @ 12:00:00 | Stop: 2017-03-04

## 2017-03-02 MED ADMIN — IPRATROPIUM BROMIDE 0.02 % IN SOLN: 500 ug | RESPIRATORY_TRACT | @ 09:00:00 | Stop: 2017-03-07 | NDC 00487980101

## 2017-03-02 MED ADMIN — INSULIN ASPART 100 UNIT/ML SC SOPN: 3 mL | SUBCUTANEOUS | @ 21:00:00 | Stop: 2017-03-03 | NDC 00169633910

## 2017-03-02 MED ADMIN — INSULIN ASPART 100 UNIT/ML SC SOPN: 3 mL | SUBCUTANEOUS | @ 08:00:00 | Stop: 2017-03-03 | NDC 00169633910

## 2017-03-02 MED ADMIN — HYDROCORTISONE NA SUCCINATE PF 100 MG IJ SOLR: 50 mg | INTRAVENOUS | @ 02:00:00 | Stop: 2017-03-05 | NDC 00009001104

## 2017-03-02 MED ADMIN — NOREPINEPHRINE 8, 16, 32 MG/250 ML DRIP: 10.34103.4 ug/min | INTRAVENOUS | @ 02:00:00 | Stop: 2017-03-06 | NDC 36000016210

## 2017-03-02 MED ADMIN — HYDROCORTISONE NA SUCCINATE PF 100 MG IJ SOLR: 50 mg | INTRAVENOUS | @ 20:00:00 | Stop: 2017-03-05 | NDC 00009001104

## 2017-03-02 MED ADMIN — CHLORHEXIDINE GLUCONATE 0.12 % MT SOLN: 10 mL | OROMUCOSAL | @ 16:00:00 | Stop: 2017-03-13 | NDC 52376002110

## 2017-03-02 MED ADMIN — FLUDROCORTISONE ACETATE 0.1 MG PO TABS: .05 mg | ORAL | @ 16:00:00 | Stop: 2017-03-05 | NDC 68084028801

## 2017-03-02 MED ADMIN — NOREPINEPHRINE 8, 16, 32 MG/250 ML DRIP: 10.34103.4 ug/min | INTRAVENOUS | @ 09:00:00 | Stop: 2017-03-06

## 2017-03-02 MED ADMIN — PIPERACILLIN-TAZOBACTAM 3.375 GM/50 ML RTU (EXT 4HR): 3.375 g | INTRAVENOUS | @ 16:00:00 | Stop: 2017-03-02 | NDC 00206886102

## 2017-03-02 MED ADMIN — IPRATROPIUM BROMIDE 0.02 % IN SOLN: 500 ug | RESPIRATORY_TRACT | @ 20:00:00 | Stop: 2017-03-07 | NDC 00487980101

## 2017-03-02 MED ADMIN — FUROSEMIDE 10 MG/ML IJ SOLN: 60 mg | INTRAVENOUS | Stop: 2017-03-02 | NDC 36000028425

## 2017-03-02 MED ADMIN — PIPERACILLIN-TAZOBACTAM 3.375 GM/50 ML RTU (EXT 4HR): 3.375 g | INTRAVENOUS | @ 08:00:00 | Stop: 2017-03-02 | NDC 00206886102

## 2017-03-02 NOTE — Other
Patients Clinical Goal:   Clinical Goal(s) for the Shift: keep MAP>65, SpO2>88<92% ARDS NET, keep pt sedated RAS-1-2, maintain comfort, sedation holiday with SBT in am, CT scan, labs, CHG, update and teaching of family member, strict I&O goal -500.  Identify possible barriers to advancing the care plan:   Stability of the patient: Moderately Unstable - medium risk of patient condition declining or worsening    End of Shift Summary: MAP>65 on NE 0.02 mcg/kg/min unable to titrate it to off, SpO2>88<92% ARDS NET, pt is sedated to RAS-2 and comfortable, CT scan is done, labs sent reviewed results and paged Md the results, CHG is done, family updated teaching is done, I&O within goal.

## 2017-03-02 NOTE — Consults
Department of Care Coordination and Clinical Social Work  Brief Psychosocial Assessment    Date seen: 03/02/2017    Date referred: 03/02/17    Referral Source: MD and RN     Reason for Referral: Patient/Family Emotional Distress, Assessment of Support System and Meal Vouchers       Primary Contact/Identified Alternative Decision Maker(s): Seyed Zientek spouse     Problems: Active Problems:    Dyspnea POA: Yes    Acute hypoxemic respiratory failure (HCC/RAF) POA: Yes       Past Medical History:   Diagnosis Date   ??? Cancer (HCC/RAF)    ??? Diabetes (HCC/RAF)    ??? GERD (gastroesophageal reflux disease)    ??? Hypercholesteremia    ??? Hypertension    ??? PAD (peripheral artery disease) (HCC/RAF)    ??? 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 Prostate disease    ??? Scoliosis     adolesent   ??? Vascular disease     Past Surgical History:   Procedure Laterality Date   ??? BACK SURGERY     ??? BILATERAL FEMORAL ARTERY EXPLORATION  2009   ??? BLADDER SURGERY  1999   ??? PROSTATE SURGERY  2000        Language/Religion/Culture: Patient identifies as Baptist and does participate regularly.  Primary language spoken is [place].  Pt is African American.     Current Living Situation/Support System and Primary Caregiver: Pt lives with his wife and daughter in the [place] area of [place].  The Pt's wife has her own business, a day care center. She stated today that she has family members out of stated who would travel to [place] ''anytime she needed'' them.  The Pt and his wife are involved in their church group and there are ''many people Praying'' for the Pt per her report.     Patient/Family Concerns: The cost of supporting the Pt at bedside (parking and meals).  Also the stress of the pending diagnostics.    Assessment: Pt is a 77 y.o. African American male with a history of cancer and PAD/partial amputation of foot. SEe medical reports for details.  CSW responded to a request from MICU Resident Dr. Krista Blue to meet with the Pt's wife and to assess for SW needs.  Pt is intubated and sedated.  CSW met with the Pt's wife at MICU bedside and offered SW support and explained role/provided business card.  Mrs. Hayter was cooperative and engaged while providing history.   CSW provided meal vouchers and explained EPAF program.   CSW provided a parking voucher and he Pt's wife expressed her appreciation.      Intervention/Plan/Recommendations: CSW assessed for SW needs and provided hospital resources.  CSW consulted with MICU RN Hoy Register and Dr. Krista Blue.  CSW plans to provide referral to [facility] as needed.       Assunta Gambles Lorece Keach, LCSW,  03/02/2017

## 2017-03-02 NOTE — Other
Patients Clinical Goal:   Clinical Goal(s) for the Shift: 1. Maintain hemodynamic stability, MAP>65 2) Maintain respiratory status, ARDSNET protocol Sp02>88% 3) 1/0 goal -500 4)Maintain comfort and safety   Identify possible barriers to advancing the care plan: ARDSnet, Hemodynamic instability   Stability of the patient: Unstable - high likelihood or risk of patient condition declining or worsening; patient condition declining or worsening    End of Shift Summary:     1) Patient maintained hemodynamic stability. MAP>29mmHg with use of levophed gtt. Levophed currently at 0.70mcg/kg/min. Patient received 6g mag for a mag of 1.8 and 161meq of K for a K of 3.0 (pending another 75meq IV ordered). Patient goals are mag of 2.5 and K of 4.5.     2) Patient remains on ARDS NET with ABG sent down q4 with vent changes done by RT. Patient has copious oral secretions suctioned with yankaur and moderate-small secretions of tan/white cloudy consistency noted. CXR performed per MD order. Last PF ratio 110.     3) Patient received 60mg  of lasix IVP x1. Patient net +.5L for shift.     4) Patient maintained safe, comfortable, and free of harm throughout shift. Patient maintained RASS -2 with propofol gtt at 17mcg/kg/min, fentanyl gtt at 150mcg/hr, and precedex at 0.70mcg/kg/hr. Patient remains on TPN with BS checked and covered per MD order.     Family at bedside throughout shift, updated on plan of care.     Will endorse MICU plan of care to night shift RN

## 2017-03-02 NOTE — Nursing Note
0700: Report received from night shift RN. Patient remains on ARDS net protocol. Patient restrained bilaterally at wrists. Patient had safety checks performed. Gtts double checked with off going RN. Education provided. Foley care provided. Patient had assessment completed. Patient family at bedside updated on plan of care  0800: MD/RT notified of patient desaturating. ABG sent down with results relayed to MD/RT  0815: Chest X ray performed and completed

## 2017-03-02 NOTE — Progress Notes
INFECTIOUS DISEASES PROGRESS NOTE    Patient: Ryan Shepard  MRN: [id number]  DOB: 1941-02-20  Date of Service: 03/02/2017  Requesting Physician: Eliane Decree., MD, PhD  Reason for Consultation: R/o Aspergillus Pneumonia    Chief Complaint: Hypoxia    History of Present Illness:   Ryan Shepard is a 77 y/o M with COPD, tobacco use disorder, PAD s/p femoral graft, HTN, DM2, HLD, Afib, GERD, depression, anxiety, chronic pain readmitted on 12/28 following discharge to home on 12/26 with recurrence of dyspnea.     Recent hospital course as follows:  12/6: Admitted with a COPD exacerbation in the setting of diagnosed COPD and extensive tobacco use disorder. CXR on admission showing confluent airspace opacities c/f multifocal aspiration/pneumonia. Procal neg. Treated with levaquin (12/6- ) x 7 days and 5 day course of prednisone.   12/11: Patient was planned for discharge but then was found on bacterial resp culture from admission (12/6) to have aspergillus species, few probably candida. Other micro including legionella urine ag neg, MRSA nares neg, HIV neg. Had CT chest done showing airspace and nodular consolidation within the left upper lobe and lingula and lesser within the right upper and right lower lobes consistent with pneumonia, likely fungal. Also with bulky intrathoracic multicompartamental lymphadenopathy. 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 Started on acyclovir 800 mg BID x 5 days. Gonorrhea/chlamydia neg. RPR pending. Bacterial culture no growth to date. HIV neg.   12/12: VSS, no other acute changes. Requiring 3 L NC. No changes in respiratory status.   12/13: VSS. No acute changes. Pulmonology consulted, recommended bronch,  performed same day. Pt states his SOB has improved drastically. No fevers, chills, nausea, vomiting. No new culture data. 12/14: Events noted; the patient became increasingly hypoxic after the bronchoscopy despite using a 15 liter face mask. The blood also noted hypercapnia.  According to the notes the patient reported worsening cough associated blood tinged sputum, but today he claims there was no change in his breath or cough. He was transferred to ICU for further monitoring and started on voriconazole, piperacillin-tazobactam and vancomycin. He denied fever, chills chest pain or changes in his breathing.  12/15: Remains AF with normal WBC. Reports he is coughing more today but breathing feels better.   12/16: Transferred to the floor, AF  12/23: Completed 14 day course of antibiotic treatment (Vanco/Zosyn/Vori x 3 days; Ceftriaxone/Flagyl x 6 days; Vanco/Zosyn x 5 days)  12/26: Discharged home  12/28: Seen in Medicine clinic for routine follow-up, noted to have O2Sat in low 70's on 4L NC, readmitted to hospital with BiPap treatment, Vanco/Zosyn/Azithromycin restarted      Hospital Course (Key Events): Date of Admission 02/08/2017   12/28: Admitted  12/31: On Bipap this AM, continued productive cough. Intubated.  1/1: BAL w/ thick mucous, therapeutic suctioning  1/2: AF, intubated, FiO2 80%, PEEP 10, levo at 0.18. WBC 11 from 13s, lactate still elevated 28. Pt sedated and non-responsive. Transitioned to ARDSnet protocol. Started levofloxacin. Started hydrocort and fludrocort.  1/3: Tmax 38.3 yesterday, FiO2 improved to 60%, PEEP still 10, WBC 7 from 10, Cr 1.2 from 1.4, levophed off and on vaso 2.4. Intubated, sedated. 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 Head CT wo abscess.    1/4: Tmax 38, levo at 0.02, 70% FiO2, PEEP 10 on vent. Pt intubated and sedated on vent. Discussed pt  w/ wife at bedside.     Pertinent antimicrobial courses:  Bactrim IV 1/3-  Pip-tazo 12/28-  Levofloxacin 1/2-    Azithromycin 12/28-1/2  Vancomycin 12/28-30    Review of Systems:  Unable to perform as pt intubated and sedated Past Medical History:  Past Medical History:   Diagnosis Date   ??? Cancer (HCC/RAF)    ??? Diabetes (HCC/RAF)    ??? GERD (gastroesophageal reflux disease)    ??? Hypercholesteremia    ??? Hypertension    ??? PAD (peripheral artery disease) (HCC/RAF)    ??? 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 Prostate disease    ??? Scoliosis     adolesent   ??? Vascular disease       Allergies:   No Known Allergies    Medications:  Scheduled Meds:  ??? albuterol  2.5 mg Nebulization Q6H   ??? chlorhexidine  10 mL Mouth/Throat BID   ??? cotrimoxazole IV  500 mg tmp Intravenous Q8H   ??? fludrocortisone  0.05 mg Oral Daily   ??? fluticasone  1 puff Inhalation BID   ??? heparin  5,000 Units Subcutaneous BID   ??? hydrocortisone IV  50 mg Intravenous Q6H   ??? ipratropium  0.5 mg Nebulization Q6H   ??? levoFLOXacin  750 mg Intravenous Q48H   ??? pantoprazole  40 mg IV Push Q24H   ??? piperacillin/tazobactam  3.375 g Intravenous Q8H   ??? polyethylene glycol  17 g Oral Daily   ??? senna  1 tablet Oral QHS     Continuous Infusions:  ??? dexmedetomidine 4 mcg/mL drip 0.7 mcg/kg/hr (03/02/17 0600)   ??? fentaNYL 2.5 mg/100 mL drip 200 mcg/hr (03/02/17 0600)   ??? norepinephrine drip 0.02 mcg/kg/min (03/02/17 0600)   ??? propofol drip 35 mcg/kg/min (03/02/17 0600)   ??? sodium chloride 5 mL/hr (02/26/17 0041)   ??? sodium chloride 5 mL/hr (02/27/17 1610)   ??? TPN Adult 83 mL/hr at 03/01/17 2230     PRN Meds:.acetaminophen, insulin aspart **AND** dextrose, oxyCODONE      Physical Exam:  Temp:  [36.4 ???C (97.5 ???F)-38 ???C (100.4 ???F)] 36.4 ???C (97.5 ???F)  Heart Rate:  [63-92] 66  Resp:  [14-23] 17  BP: (99-149)/(48-65) 99/48  Arterial Line BP (mmHg): (62-151)/(35-78) 123/54  MAP:  [46 mmHg-105 mmHg] 78 mmHg  FiO2 (%):  [60 %-100 %] 70 %  SpO2:  [86 %-95 %] 90 %  Temp (24hrs), Avg:37.6 ???C (99.6 ???F), Min:36.4 ???C (97.5 ???F), Max:38 ???C (100.4 ???F)    Intake and Output:   Last Two Completed Shifts:  I/O last 2 completed shifts: In: 2869.7 [I.V.:1467.2; Other:20; NG/GT:210; IV Piggyback:550]  Out: 2980 [Urine:2670; Emesis/GI output:310]  Vitals:    02/28/17 0500   Weight: (!) 102.2 kg (225 lb 5 oz)   Height:      Gen: Older AA man, intubated, sedated  HEENT: Sclerae anicteric, ETT in place  Neck: Soft, supple without lymphadenopathy.  CV: Regular rate, normal S1/S2. No murmurs appreciated.   Chest: CTAB w/ vent sounds anteriorly  Abdomen: Soft, non-distended, non-tender, normoactive bowel sounds  Musculoskeletal: No clubbing, cyanosis, or edema.   Neuro: Sedated    Labs:  Lab Results   Component Value Date    WBC 7.38 03/02/2017    HGB 10.1 (L) 03/02/2017    HCT 31.0 (L) 03/02/2017    MCV 87.1 03/02/2017    PLT 188 03/02/2017     Lab Results  Component Value Date    CREAT 1.32 (H) 03/02/2017    BUN 25 (H) 03/02/2017    NA 136 03/02/2017    K 3.0 (L) 03/02/2017    CL 94 (L) 03/02/2017    CO2 23 03/02/2017     Lab Results   Component Value Date    ALT 18 02/08/2017    AST 20 02/08/2017    ALKPHOS 50 02/08/2017    BILITOT 0.4 02/08/2017       Microbiology:   1/3 Resp cx Probable C.albicans  1/2 BCx x2 NGTD  1/2 UA neg  12/31 BAL cx - neg or NGTD  12/28 Resp Cx Probable C.albicans  12/28 Blood Cx NTD  12/28 RVP neg  12/18 Resp cx Neg (C albicans)  12/13 BAL Nocardia cx: Rare beaded Gram positive rods  12/13 BAL cx Neg  12/13 Respiratory culture: Candida, otherwise negative so far; AFB smear negative x 2 (AFB cultures NTD)  12/13 Legionella culture neg  Aspergillus Ag EIA -  <0.50  RVP panel negative  Aspergillus fumigatus (M3) IgE - NEG   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  HIV - neg  Legionella Urinary Ag - neg  Chlamydia/gonorrhea PCR - neg  RPR nonreactive    Imaging Reviewed by Me: 1/3 CTH w con: No acute intracranial hemorrhage or mass effect. No abscess is seen.    1/3 US liver bx: Technically successful ultrasound-guided core needle biopsy of segment 8 hypodense lesion.     1/3 CXR: My read - ETT in place, BL airspace disease, perhaps slightly worse from prior    12/28 CXR  Unchanged enlarged cardiomediastinal silhouette from combination of mediastinal adenopathy and left hilar mass extending into upper lobe, and lower lobe with left lymphangitic carcinomatosis. Unchanged moderate left pleural effusion.    12/20 CT Chest:  1.   Redemonstration of coalescent airspace and nodular consolidation, most prominent in the left upper lobe and lingula with peripheral groundglass associated with Unchanged to slightly more prominent mediastinal and bilateral hilar lymphadenopathy.   This finding is nonspecific but concerning for malignancy versus pneumonia with etiologies including fungal infection.   2.  Increase in size of pleural and pericardial effusions and anasarca consistent with volume overload.  3.   Dilated main pulmonary artery measuring 39 mm reflecting pulmonary arterial hypertension.  4.  Severe coronary artery calcifications.    12/20 CT A/P:  History of prostate cancer treated with total prostatectomy with at least 4 hypervascular hepatic masses measuring up to 5 cm in the right hepatic lobe, suspicious for metastatic disease. No abdominal or pelvic lymphadenopathy.  Severe atherosclerotic disease with thrombosed left common, external, and femoral artery, and patent femorofemoral bypass.      Assessment:   77yo M w/ h/o COPD, prostate CA with possible metastatic disease, recent admission for dyspnea treated with 14 days antibiotics, now readmitted with hypoxia but pt comfortable on exam. ID consulted for further w/u and management.    Pt initially w/ severe hypoxia on supplemental O2, diffuse nodular opacities in lungs, and continued production of purulent sputum, requiring intubation and ARDSnet protocol. Recurrent hypoxia likely secondary to a persistent or recurrent bacterial infection secondary to obstructive pulmonary process (COPD +/- lung metastases). Treating empirically for recurrent PNA. S/p 5  days of azithromycin. Continuing on empiric pip-tazo. Started on levofloxacin 1/2. 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    Although concern for possible aspergillus pneumonia is reasonable in this patient with persistent lung disease and imaging consistent with fungal infection, only A [place] (not a common cause of pulmonary disease) has been isolated on a single expectorated sputum sample, with no growth on multiple other respiratory cultures, including a BAL culture, and negative galactomannan assay (while on Zosyn).     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  - Cont Bactrim 500mg  IV q8hrs for Nocardia pneumonia (1/3- )  - Cont empiric levofloxacin 750mg  IV daily (1/2- )  - F/u resp cx, including AFB cx from 12/13  - F/u urine and serum histo Ag    Thank you for this consultation. We will continue to follow with you. Please page [phone] (General ID) with any questions.  DWA Dr. Chestine Spore  Recommendations relayed to primary ICU team.    Author:  Jeanne Ivan, MD  Infectious Disease Fellow, PGY-4  03/02/2017  7:52 AM

## 2017-03-02 NOTE — Nursing Note
1900 Received report.   2000 Assessments done. Teaching is done. Labs sent. Medications given.   2100 lab results reviewed and paged to Md.   2200 head CT w/contrast is done. Pt tolerated well, except episode of desating.  2300 CHG is done.   0000 Assessments done. Teaching is done. Medications given. Labs sent.   0100 lab results reviewed and paged to Md and RT.  0400 Assessments done. Teaching is done. Labs sent.   0500 lab results reviewed and paged to Md.  0600 meds given. Md was notified twice regarding K level.

## 2017-03-02 NOTE — Other
Patients Clinical Goal:   Clinical Goal(s) for the Shift: 1.) Maintain hemodynamic stability, MAP > 65. 2.) SpO2 > 88% on ARDS net protocol. 3.) Safety and comfort. 4.) I/O goal even to slightly postitive. 5.) TPN per pharmacy. 6.) Liver biopsy in IR  Identify possible barriers to advancing the care plan:   Stability of the patient: Moderately Unstable - medium risk of patient condition declining or worsening    End of Shift Summary:     1.) Tolerated being off Levo gtt while on Vaso. After MICU rounds, decision was to d/c vaso and go back to Levophed gtt to keep MAP > 65. Labile blood pressure but Levophed gtt kept at a low dose between 0.03 mcg/kg/min to 0.1 mcg/kg/min to maintain MAP > 65. NSR on monitor, rare PVCs. No evidence of active bleeding.    2.) Maintained on ARDS net protocol with ABGs within acceptable range.     3.) Remained on Propofol, Fentanyl, and Precedex for moderate sedation and analgesia, appears comfortable on current dose. Intermittent episodes of agitation while weaning down Propofol. Patient occasionally responds with non-verbal cues but inconsistent. Bilateral soft wrist restraints for safety with restraint order updated for this calendar day. Maximized comfort and safety at all times.    4.) I/O goal changed to even to -500. Spot doses of lasix administered to meet goal. Pharmacy also consulted to double concentrate meds to help meet I/O goal.     5.) TPN to start tonight.    6.) Tolerated US guided liver biopsy. Required increase in sedation prior to procedure to keep patient comfortable. Puncture site covered with bandaid, clean and intact with no evidence of hematoma and bleeding.     - pending CT of the head with contrast.      Ethics Protocol   Ethics Risk Conflict Score: HIGH risk for ethical confict - is likely or very likely to develop under these conditions (more than 6 risk factors checked or ANY *critical risk factor checked) Reason/Rationale (Why?): Potential escalation of non-beneficial treatment, poor/unclear prognosis, moral distress in family/clinicians   Additional Info (specify):    (Examples: Review the Goals of Care/Conference Note on (?), Ethics/Palliative Consult on (?); Family Conference set up with Attending/Team & primary decision makers on (?); important to be consistent and set limits with (?) about (?); Nurse, adult in place; and etc.)

## 2017-03-02 NOTE — Consults
Patient remains in the MICu on the vent and on ARDS NET, also on Fentanyl. Propofol, Levo and Precedex  Gtts;  Not stable for any discharge planning as yet.

## 2017-03-03 ENCOUNTER — Ambulatory Visit: Payer: MEDICARE

## 2017-03-03 ENCOUNTER — Ambulatory Visit: Payer: PRIVATE HEALTH INSURANCE

## 2017-03-03 DIAGNOSIS — J441 Chronic obstructive pulmonary disease with (acute) exacerbation: Secondary | ICD-10-CM

## 2017-03-03 DIAGNOSIS — A430 Pulmonary nocardiosis: Secondary | ICD-10-CM

## 2017-03-03 DIAGNOSIS — R6520 Severe sepsis without septic shock: Secondary | ICD-10-CM

## 2017-03-03 DIAGNOSIS — J90 Pleural effusion, not elsewhere classified: Secondary | ICD-10-CM

## 2017-03-03 DIAGNOSIS — A419 Sepsis, unspecified organism: Secondary | ICD-10-CM

## 2017-03-03 LAB — Magnesium
MAGNESIUM: 2.5 meq/L — ABNORMAL HIGH (ref 1.4–1.9)
MAGNESIUM: 2.2 meq/L — ABNORMAL HIGH (ref 1.4–1.9)

## 2017-03-03 LAB — Glucose,POC
GLUCOSE,POC: 215 mg/dL — ABNORMAL HIGH (ref 65–99)
GLUCOSE,POC: 220 mg/dL — ABNORMAL HIGH (ref 65–99)
GLUCOSE,POC: 277 mg/dL — ABNORMAL HIGH (ref 65–99)
GLUCOSE,POC: 284 mg/dL — ABNORMAL HIGH (ref 65–99)
GLUCOSE,POC: 284 mg/dL — ABNORMAL HIGH (ref 65–99)
GLUCOSE,POC: 322 mg/dL — ABNORMAL HIGH (ref 65–99)
GLUCOSE,POC: 329 mg/dL — ABNORMAL HIGH (ref 65–99)

## 2017-03-03 LAB — Blood Gases, arterial
BICARBONATE: 25.2 mmol/L (ref 22.0–26.0)
PCO2: 47 mmHg — ABNORMAL HIGH (ref 38–42)
PH: 7.3 mmol/L — ABNORMAL LOW (ref 7.37–7.41)
PO2: 75 mmHg — ABNORMAL LOW (ref 98–118)
PO2: 79 mmHg — ABNORMAL LOW (ref 98–118)
PO2: 59 mmHg — ABNORMAL LOW (ref 98–118)

## 2017-03-03 LAB — Acid-Fast Culture and Stain, Respiratory: ACID-FAST STAIN (FLUOROCHROME): NONE SEEN

## 2017-03-03 LAB — Phosphorus: PHOSPHORUS: 1.2 mg/dL — ABNORMAL LOW (ref 2.3–4.4)

## 2017-03-03 LAB — Calcium,Ionized: IONIZED CA++,UNCORRECTED: 1.14 mmol/L (ref 1.09–1.29)

## 2017-03-03 LAB — Blood Lactate: BLOOD LACTATE: 43 mg/dL — ABNORMAL HIGH (ref 5–25)

## 2017-03-03 LAB — Differential Automated: ABSOLUTE EOS COUNT: 0.02 10*3/uL (ref 0.00–0.50)

## 2017-03-03 LAB — MVista Histoplasma Quantitative Antigen EIA
HISTOPLASMA ANTIGEN RESULT: NOT DETECTED ng/mL
HISTOPLASMA ANTIGEN RESULT: NOT DETECTED ng/mL

## 2017-03-03 LAB — Basic Metabolic Panel
GLUCOSE: 294 mg/dL — ABNORMAL HIGH (ref 65–99)
TOTAL CO2: 21 mmol/L (ref 20–30)
TOTAL CO2: 22 mmol/L (ref 20–30)
UREA NITROGEN: 32 mg/dL — ABNORMAL HIGH (ref 7–22)

## 2017-03-03 LAB — Bacterial Culture Respiratory

## 2017-03-03 LAB — Legionella Culture: LEGIONELLA CULTURE: NEGATIVE

## 2017-03-03 LAB — CBC: RED BLOOD CELL COUNT: 3.51 x10E6/uL — ABNORMAL LOW (ref 4.41–5.95)

## 2017-03-03 LAB — Hepatic Funct Panel
ALANINE AMINOTRANSFERASE: 14 U/L (ref 8–64)
ASPARTATE AMINOTRANSFERASE: 16 U/L (ref 13–47)

## 2017-03-03 LAB — Nocardia Culture

## 2017-03-03 MED ADMIN — DEXMEDETOMIDINE 4 MCG/ML DRIP: 20.68 ug/h | INTRAVENOUS | @ 03:00:00 | Stop: 2017-03-04 | NDC 55150020902

## 2017-03-03 MED ADMIN — FUROSEMIDE 10 MG/ML IJ SOLN: 60 mg | INTRAVENOUS | @ 05:00:00 | Stop: 2017-03-03 | NDC 36000028425

## 2017-03-03 MED ADMIN — INSULIN REGULAR HUMAN BOLUS FROM DRIP: 10 [IU] | INTRAVENOUS | @ 20:00:00 | Stop: 2017-03-03

## 2017-03-03 MED ADMIN — FUROSEMIDE 10 MG/ML IJ SOLN: 20 mg | INTRAVENOUS | Stop: 2017-03-03 | NDC 36000028225

## 2017-03-03 MED ADMIN — POTASSIUM CHLORIDE 20 MEQ/50ML RTU: 20 meq | INTRAVENOUS | @ 04:00:00 | Stop: 2017-03-03 | NDC 00338070341

## 2017-03-03 MED ADMIN — FUROSEMIDE 10 MG/ML DRIP (STRAIGHT DRUG): 30 mg/h | INTRAVENOUS | @ 23:00:00 | Stop: 2017-03-09 | NDC 36000028425

## 2017-03-03 MED ADMIN — ALBUTEROL SULFATE (5 MG/ML) 0.5% IN NEBU: 2.5 mg | RESPIRATORY_TRACT | @ 01:00:00 | Stop: 2017-03-07 | NDC 00487990130

## 2017-03-03 MED ADMIN — HYDROCORTISONE NA SUCCINATE PF 100 MG IJ SOLR: 50 mg | INTRAVENOUS | @ 08:00:00 | Stop: 2017-03-05 | NDC 00009001104

## 2017-03-03 MED ADMIN — HEPARIN SODIUM (PORCINE) 5000 UNIT/ML IJ SOLN: 5000 [IU] | SUBCUTANEOUS | @ 16:00:00 | Stop: 2017-03-06 | NDC 25021040201

## 2017-03-03 MED ADMIN — FLUDROCORTISONE ACETATE 0.1 MG PO TABS: .05 mg | ORAL | @ 16:00:00 | Stop: 2017-03-05 | NDC 68084028801

## 2017-03-03 MED ADMIN — IPRATROPIUM BROMIDE 0.02 % IN SOLN: 500 ug | RESPIRATORY_TRACT | @ 06:00:00 | Stop: 2017-03-07 | NDC 00487980101

## 2017-03-03 MED ADMIN — PROPOFOL INFUSION 10 MG/ML: 7755 ug/min | INTRAVENOUS | Stop: 2017-03-14 | NDC 63323026965

## 2017-03-03 MED ADMIN — SENNOSIDES 8.6 MG PO TABS: 1 | ORAL | @ 05:00:00 | Stop: 2017-03-06 | NDC 00904652261

## 2017-03-03 MED ADMIN — CHLORHEXIDINE GLUCONATE 0.12 % MT SOLN: 10 mL | OROMUCOSAL | @ 05:00:00 | Stop: 2017-03-13 | NDC 52376002110

## 2017-03-03 MED ADMIN — DEXMEDETOMIDINE 4 MCG/ML DRIP: 20.68 ug/h | INTRAVENOUS | @ 18:00:00 | Stop: 2017-03-04 | NDC 55150020902

## 2017-03-03 MED ADMIN — INSULIN REGULAR HUMAN 100 UNITS/100 ML DRIP FOR ICU DRIP: .540 [IU]/h | INTRAVENOUS | @ 20:00:00 | Stop: 2017-03-03

## 2017-03-03 MED ADMIN — POTASSIUM PHOSPHATE IVPB 250ML (OVER 6 HR): 18 mmol | INTRAVENOUS | @ 16:00:00 | Stop: 2017-03-03 | NDC 00409729501

## 2017-03-03 MED ADMIN — HYDROCORTISONE NA SUCCINATE PF 100 MG IJ SOLR: 50 mg | INTRAVENOUS | @ 19:00:00 | Stop: 2017-03-05 | NDC 00009001104

## 2017-03-03 MED ADMIN — INSULIN ASPART 100 UNIT/ML SC SOPN: 3 mL | SUBCUTANEOUS | @ 12:00:00 | Stop: 2017-03-03 | NDC 00169633910

## 2017-03-03 MED ADMIN — ALBUTEROL SULFATE (5 MG/ML) 0.5% IN NEBU: 2.5 mg | RESPIRATORY_TRACT | @ 06:00:00 | Stop: 2017-03-07 | NDC 00487990130

## 2017-03-03 MED ADMIN — PROPOFOL INFUSION 10 MG/ML: 7755 ug/min | INTRAVENOUS | @ 17:00:00 | Stop: 2017-03-14 | NDC 63323026965

## 2017-03-03 MED ADMIN — ALBUTEROL SULFATE (5 MG/ML) 0.5% IN NEBU: 2.5 mg | RESPIRATORY_TRACT | @ 13:00:00 | Stop: 2017-03-07 | NDC 00487990130

## 2017-03-03 MED ADMIN — POTASSIUM CHLORIDE 20 MEQ/50ML RTU: 20 meq | INTRAVENOUS | @ 08:00:00 | Stop: 2017-03-03 | NDC 00338070341

## 2017-03-03 MED ADMIN — FUROSEMIDE 10 MG/ML IJ SOLN: 60 mg | INTRAVENOUS | @ 12:00:00 | Stop: 2017-03-03 | NDC 36000028425

## 2017-03-03 MED ADMIN — COTRIMOXAZOLE 500 ML IVPB (<= 320 MG): 500 mg | INTRAVENOUS | @ 12:00:00 | Stop: 2017-03-03 | NDC 00703951403

## 2017-03-03 MED ADMIN — INSULIN ASPART 100 UNIT/ML SC SOPN: 3 mL | SUBCUTANEOUS | @ 04:00:00 | Stop: 2017-03-03 | NDC 00169633910

## 2017-03-03 MED ADMIN — PROPOFOL INFUSION 10 MG/ML: 7755 ug/min | INTRAVENOUS | @ 02:00:00 | Stop: 2017-03-14 | NDC 63323026965

## 2017-03-03 MED ADMIN — CHLORHEXIDINE GLUCONATE 0.12 % MT SOLN: 10 mL | OROMUCOSAL | @ 16:00:00 | Stop: 2017-03-13 | NDC 52376002110

## 2017-03-03 MED ADMIN — NOREPINEPHRINE 8, 16, 32 MG/250 ML DRIP: 10.34103.4 ug/min | INTRAVENOUS | @ 07:00:00 | Stop: 2017-03-06 | NDC 36000016210

## 2017-03-03 MED ADMIN — HYDROCORTISONE NA SUCCINATE PF 100 MG IJ SOLR: 50 mg | INTRAVENOUS | @ 13:00:00 | Stop: 2017-03-05 | NDC 00009001104

## 2017-03-03 MED ADMIN — INSULIN ASPART 100 UNIT/ML SC SOPN: 3 mL | SUBCUTANEOUS | @ 20:00:00 | Stop: 2017-03-03

## 2017-03-03 MED ADMIN — HYDROCORTISONE NA SUCCINATE PF 100 MG IJ SOLR: 50 mg | INTRAVENOUS | @ 01:00:00 | Stop: 2017-03-05 | NDC 00009001104

## 2017-03-03 MED ADMIN — LEVOFLOXACIN 750 MG/150 ML RTU: 750 mg | INTRAVENOUS | @ 22:00:00 | Stop: 2017-03-08 | NDC 00143972024

## 2017-03-03 MED ADMIN — COTRIMOXAZOLE 500 ML IVPB (<= 320 MG): 500 mg | INTRAVENOUS | @ 04:00:00 | Stop: 2017-03-03 | NDC 00703951403

## 2017-03-03 MED ADMIN — PROPOFOL INFUSION 10 MG/ML: 7755 ug/min | INTRAVENOUS | @ 16:00:00 | Stop: 2017-03-14 | NDC 63323026965

## 2017-03-03 MED ADMIN — IPRATROPIUM BROMIDE 0.02 % IN SOLN: 500 ug | RESPIRATORY_TRACT | @ 01:00:00 | Stop: 2017-03-07 | NDC 00487980101

## 2017-03-03 MED ADMIN — COTRIMOXAZOLE 500 ML IVPB (<= 320 MG): 500 mg | INTRAVENOUS | @ 01:00:00 | Stop: 2017-03-03

## 2017-03-03 MED ADMIN — INSULIN ASPART 100 UNIT/ML SC SOPN: 3 mL | SUBCUTANEOUS | @ 08:00:00 | Stop: 2017-03-03 | NDC 00169633910

## 2017-03-03 MED ADMIN — POLYETHYLENE GLYCOL 3350 17 G PO PACK: 17 g | ORAL | @ 16:00:00 | Stop: 2017-03-06 | NDC 68084043098

## 2017-03-03 MED ADMIN — POTASSIUM CHLORIDE 20 MEQ/50ML RTU: 20 meq | INTRAVENOUS | @ 01:00:00 | Stop: 2017-03-02

## 2017-03-03 MED ADMIN — POTASSIUM CHLORIDE 20 MEQ/50ML RTU: 20 meq | INTRAVENOUS | @ 03:00:00 | Stop: 2017-03-03 | NDC 00338070341

## 2017-03-03 MED ADMIN — TPN (ADULT): 1992 mL | INTRAVENOUS | @ 05:00:00 | Stop: 2017-03-04 | NDC 63323036059

## 2017-03-03 MED ADMIN — ALBUMIN HUMAN 25 % IV SOLN: 25 g | INTRAVENOUS | Stop: 2017-03-06 | NDC 68516521601

## 2017-03-03 MED ADMIN — PROPOFOL INFUSION 10 MG/ML: 7755 ug/min | INTRAVENOUS | @ 07:00:00 | Stop: 2017-03-14 | NDC 63323026965

## 2017-03-03 MED ADMIN — PROPOFOL INFUSION 10 MG/ML: 7755 ug/min | INTRAVENOUS | @ 19:00:00 | Stop: 2017-03-14 | NDC 63323026965

## 2017-03-03 MED ADMIN — INSULIN ASPART 100 UNIT/ML SC SOPN: 3 mL | SUBCUTANEOUS | @ 20:00:00 | Stop: 2017-03-10 | NDC 00169633910

## 2017-03-03 MED ADMIN — COTRIMOXAZOLE 200-40 MG/5ML PO SUSP: 450 mg | ORAL | @ 23:00:00 | Stop: 2017-03-04 | NDC 65862049647

## 2017-03-03 MED ADMIN — COTRIMOXAZOLE 500 ML IVPB (<= 320 MG): 500 mg | INTRAVENOUS | @ 19:00:00 | Stop: 2017-03-03

## 2017-03-03 MED ADMIN — PANTOPRAZOLE SODIUM 40 MG IV SOLR: 40 mg | INTRAVENOUS | @ 08:00:00 | Stop: 2017-03-13 | NDC 55150020210

## 2017-03-03 MED ADMIN — IPRATROPIUM BROMIDE 0.02 % IN SOLN: 500 ug | RESPIRATORY_TRACT | @ 19:00:00 | Stop: 2017-03-07 | NDC 00487980101

## 2017-03-03 MED ADMIN — IPRATROPIUM BROMIDE 0.02 % IN SOLN: 500 ug | RESPIRATORY_TRACT | @ 13:00:00 | Stop: 2017-03-07 | NDC 00487980101

## 2017-03-03 MED ADMIN — ALBUTEROL SULFATE (5 MG/ML) 0.5% IN NEBU: 2.5 mg | RESPIRATORY_TRACT | @ 19:00:00 | Stop: 2017-03-07 | NDC 00487990130

## 2017-03-03 MED ADMIN — HEPARIN SODIUM (PORCINE) 5000 UNIT/ML IJ SOLN: 5000 [IU] | SUBCUTANEOUS | @ 05:00:00 | Stop: 2017-03-06 | NDC 25021040201

## 2017-03-03 MED ADMIN — CHLOROTHIAZIDE SODIUM 500 MG IV SOLR: 500 mg | INTRAVENOUS | @ 11:00:00 | Stop: 2017-03-03 | NDC 25021030520

## 2017-03-03 MED ADMIN — INSULIN ASPART 100 UNIT/ML SC SOPN: 3 mL | SUBCUTANEOUS | @ 16:00:00 | Stop: 2017-03-03 | NDC 00169633910

## 2017-03-03 MED ADMIN — FENTANYL 2.5 MG/100 ML NS DRIP: 50 ug/h | INTRAVENOUS | @ 12:00:00 | Stop: 2017-03-13 | NDC 00409909461

## 2017-03-03 MED ADMIN — POTASSIUM CHLORIDE 20 MEQ/50ML RTU: 20 meq | INTRAVENOUS | @ 10:00:00 | Stop: 2017-03-03 | NDC 00338070341

## 2017-03-03 MED ADMIN — PROPOFOL INFUSION 10 MG/ML: 7755 ug/min | INTRAVENOUS | @ 11:00:00 | Stop: 2017-03-14 | NDC 63323026965

## 2017-03-03 NOTE — Progress Notes
MICU Attending Note    Pt Name:  Ryan Shepard  MRN: [id number]  Date: 03/02/2017  Time of initial visit: 1130      Remains intubated, on vasoactive support        Temp:  [36 ???C (96.8 ???F)-37.9 ???C (100.2 ???F)] 37.3 ???C (99.1 ???F)  Heart Rate:  [62-92] 74  Resp:  [14-21] 18  Arterial Line BP (mmHg): (62-151)/(35-78) 78/52  MAP:  [46 mmHg-105 mmHg] 64 mmHg  FiO2 (%):  [65 %-70 %] 70 %  SpO2:  [83 %-93 %] 86 %     Intake/Output Summary (Last 24 hours) at 03/02/17 1734  Last data filed at 03/02/17 1700   Gross per 24 hour   Intake          4335.14 ml   Output             3920 ml   Net           415.14 ml       Vent Mode: VC  FiO2 (%):  [65 %-70 %] 70 %  Rate:  [18] 18  VT (mL):  [480 mL] 480 mL  PEEP (setting):  [10 cmH2O-12 cmH2O] 12 cmH2O     PHYSICAL EXAM  General: Intubated  HEENT: NC/AT, +ETT  Neck: No TM, No LAN  Lungs: Coarse breath sounds bilaterally  CV: Regular rhythm  Abd: +BS, nontender  Ext: No C/C/E.  1+ pulses with good capillary refill.  Normal skin coloration  Neuro: Sedated      Chemistries  Recent Labs      03/02/17   1545  03/02/17   0400  03/01/17   1600  03/01/17   0409   NA  140  136  134*  134*   K  3.6  3.0*  3.8  3.9   CL  97  94*  93*  94*   CO2  21  23  22  21    BUN  29*  25*  25*  21   CREAT  1.29  1.32*  1.36*  1.28   GLUCOSE  294*  260*  182*  174*   CALCIUM  8.6  8.6  8.8  8.6   MG   --   1.8  2.0*  2.0*   PHOS   --   3.4   --   5.0*     LFTs  No results for input(s): AST, ALT, ALKPHOS, GGT, BILITOT, BILICON, TOTPRO, ALBUMIN in the last 72 hours.  Hematology  Recent Labs      03/02/17   0400  03/01/17   0409  02/28/17   1614   WBC  7.38  7.93  10.30*   HGB  10.1*  10.7*  10.9*   PLT  188  186  195     Coagulation profile  Recent Labs      02/28/17   0351  02/27/17   2353   APTT  42.5*  41.8*       Lab Results   Component Value Date    PHART 7.30 (L) 03/02/2017    PHART 7.36 (@) 01/14/2008    PO2ART 77 (L) 03/02/2017    PO2ART 77 (@) 01/14/2008    PCO2ART 51 (H) 03/02/2017 PCO2ART 50 (@) 01/14/2008    BICARBART 24.1 03/02/2017    BICARBART 27.6 (@) 01/14/2008    BEART -2 03/02/2017    BEART 2 01/14/2008       CXR - L>R opacities, PVC  A/P -     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  Please see their notes for further details.  Critical Care Time provided = 50 minutes,  exclusive of procedures.      Eliane Decree MD PhD  Associate Clinical Professor of Medicine  Pulmonary & Critical Care Medicine  [employer] at [facility]

## 2017-03-03 NOTE — Progress Notes
.  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    -- Lines/Tubes --   ETT (size 8.19mm), CVC 3-lumen L fem, A-line, NGT  -- Drips --  Fent, Precedex, propofol, levophed, d/c'd vasopressin  -- Antimicrobials --  Zosyn, Levofloxacin  ???    IE/SUBJECTIVE:   IE:  - Nocardia cx prelim positive for beaded gram-positive rods, Bactrim started  - Lasix spotted  - Short runs of NSVT    Subjective:   - intubated and sedated. Unable to assess    OBJECTIVE:   VITALS:  Temp:  [36 ???C (96.8 ???F)-37.9 ???C (100.2 ???F)] 37.3 ???C (99.1 ???F)  Heart Rate:  [62-92] 74  Resp:  [14-21] 18  Arterial Line BP (mmHg): (62-151)/(35-78) 78/52  MAP:  [46 mmHg-105 mmHg] 64 mmHg  FiO2 (%):  [65 %-70 %] 70 %  SpO2:  [83 %-93 %] 86 %  01/03 0701 - 01/04 0700  In: 2957.7 [I.V.:1467.2]  Out: 3100 [Urine:2790]     Wt Readings from Last 1 Encounters:   02/28/17 (!) 102.2 kg (225 lb 5 oz)     Recent Labs      03/02/17   1545  03/02/17   1235  03/02/17   0751  03/02/17   0400   GLUCOSEPOC  284*  266*  246*  236*   }  Oxygen Therapy  SpO2: (!) 86 %  O2 Device: ETT, Mechanical Ventilator  FiO2 (%): 70 %  O2 Flow Rate (L/min): 40 L/min     Vent Mode: VC  FiO2 (%):  [65 %-70 %] 70 %  Rate:  [18] 18  VT (mL):  [480 mL] 480 mL  PEEP (setting):  [10 cmH2O-12 cmH2O] 12 cmH2O    PHYSICAL EXAM:  Unchanged from yest  General - Intubated/Sedated  Neuro - Sedated, +gag, pupils reactive  Cardiovascular - RRR no m/r/g, unable to assess JVP d/t girth, 2+ radial pulses b/l  Lungs - No audible cuff leak, no wheezing anteriorly, mechanical breath sounds  Skin - No visible rashes or lesions Abdomen - Central obesity, soft, NT/ND  Extremeties - Warm and well-perfused. 1+ LE edema    LABORATORY DATA:   BMP  Recent Labs      03/02/17   1545  03/02/17   0400  03/01/17   1600  03/01/17   0409   NA  140  136  134*  134*   K  3.6  3.0*  3.8  3.9   CL  97  94*  93*  94*   CO2  21  23  22  21    BUN  29*  25*  25*  21   CREAT  1.29  1.32*  1.36*  1.28   CALCIUM  8.6  8.6  8.8  8.6   MG   --   1.8  2.0*  2.0*   PHOS   --   3.4   --   5.0*     CBC  Recent Labs  03/02/17   0400  03/01/17   0409  02/28/17   1614   WBC  7.38  7.93  10.30*   HGB  10.1*  10.7*  10.9*   HCT  31.0*  32.1*  32.7*   MCV  87.1  85.8  87.9   PLT  188  186  195     Coags  Recent Labs      02/28/17   0351  02/27/17   2353   APTT  42.5*  41.8*     Blood gas  Recent Labs      03/02/17   1545   PHART  7.30*   PCO2ART  51*   PO2ART  77*   BICARBART  24.1   BEART  -2       Micro:    Microbiology:   12/31 BAL cx - neg or NGTD  12/28 Resp Cx Probable C.albicans  12/28 Blood Cx NTD  12/28 RVP neg  12/18 Resp Cx Neg (C albicans)  12/13 BAL Cx Neg  12/13 respiratory culture: candida, therwise negative so far; AFB smear negative x 2 (AFB cultures NTD)  12/13 legionella culture neg  Aspergillus Ag EIA - ???<0.50  RVP panel negative  Aspergillus fumigatus (M3) IgE - NEG   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  HIV - neg  Legionella Urinary Ag - neg  Chlamydia/gonorrhea PCR - neg  RPR nonreactive    IMAGING:     CTA chest (1/1):   IMPRESSION:  1. No pulmonary embolus to the segmental level excluding the right lower lobe segmental arteries, where images are motion degraded.  2. Unchanged appearance of coalescent airspace and nodular consolidation, most prominent in the left upper lobe and lingula, as well as confluent mediastinal lymphadenopathy.  3. Reflux of contrast into the hepatic veins, suggesting elevated right heart pressures.  4. Interval increase in size of moderate left pleural effusion.  5. Enlarged main pulmonary artery, which may be seen in pulmonary arterial hypertension.    TTE (12/28):  CONCLUSIONS:   1. Small LV size, moderate septal hypertrophy, normal wall motion and hyperdynamic systolic function.   2. Left ventricular ejection fraction is approximately >75%.   3. Mild LV diastolic dysfunction (grade I, impaired relaxation).   4. Normal right ventricular size and normal systolic function. (TAPSE 2.6 cm, DTI 27.0 cm/s).   5. Mild tricuspid valve regurgitation with estimated pulmonary artery systolic pressure of 51 mm Hg + CVP/JVP (IVC assessment inaccurate in setting of mechanical ventilation).   6. Small anterior pericardial effusion, measuring up to 8 mm. No echocardiographic evidence of tamponade.   7. Compared to TTE performed 02/01/2017, no significant changes noted with LV and RV function. Pericardial effusion is new.      ASSESSMENT & PLAN:   Ryan Shepard???is a 77 y.o.???male???with a history of COPD (on 3LNC), HFpEF, afib, HTN, DM2, PAD s/p femoral graft, prior prostate CA who was admitted for hypoxemic respiratory failure requiring BiPAP.    RESP:  #Acute on chronic hypoxemic respiratory failure  Admitted from clinic requiring BiPAP in setting of decreased arterial PaO2 and desats to low 80s. Recently admitted w/ bronch showing chronic colonization with aspergillus. Has features c/f malignancy on imaging. Respiratory status continues to worsen with worsened  xray. S/p upsized ETT on 1/1 w/ resolution of cuff leak. Initial c/f PE, CTA read no PE so d/c'd heparin gtt, LE dopplers negative for DVT.   - ARDS NET protocol  - Serial ABGs   - Malignancy work-up, as below  - Antibiotics, as below  ???  #Chronic COPD  Increased sputum production x 2 months, slight wheezing and recently treated with steroids while hospitalized. Current presentation less c/w COPD exacerbation.  - Duonebs q6h  - Inhaled Flovent BID  - Empiric abx, as below  ???  CV:  #Hypovolemic shock in setting of sedation  Patient requires significant sedation d/t air leak on cuff of ETT, patient changing position can increase air leak. Hypotension requiring pressors in setting of sedation but elevated lactate and signs of end-organ damage including rising Cr. Patient has leukocytosis and on empiric abx, no other signs of infection.  - A-line for accurate BP monitoring  - Pressors: levophed, vasopressin  - Cont hydrocortisone and fludrocortisone (1/2- )  - Trend lactate  - Goal MAPs > 60 on A-line    #HFpEF  Home dose lasix PO 40mg  BID. TTE shows hyperdynamic EF >75% and elevated PASP to  - S/p albumin x 1 on 1/2  - Spot dose IV lasix to maintain goal net -500 to -1L over 24h  - BID lytes, daily weights, strict I&O's     #Afib  Rate-controlled. CHADS2VaSC 3-4  - Hold home carvedilol 25 mg BID  - Hold home apixaban 5 mg BID    NEURO:  #Intubated and sedated 2/2 hypoxemic respiratory failure  - Cont sedation with fentanyl/precedex/propofol    ID:  #Leukocytosis, improving  Afebrile. Increased sputum production x 2 months otherwise no localizing signs of infection. Did receive course of methylprednisolone 12/18-12/23 on recent hospitalization. 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  HEME:  #Hypervascular Liver Lesions/Mediastinal LAD- c/f malignancy, hx prostate cancer treated with total prostatectomy. Current presentation more amenable to IR biopsy of liver lesions, EBUS less given accessibility of lung lesions.  - IR liver biopsy done 1/3, f/u on path  ???  CHRONIC  #HTN  - Hold home BP meds in setting of shock: nifedipine 30mg  daily, losartan 100 mg daily, carvedilol 25 mg BID  ???  #Chronic???pain   Localized to lower back and lower extremities  - Hold home Norco 5-325 q6h PRN while sedated  ???  #GERD chronic/stable  - Pantoprazole PO 40 mg daily 12/31  ???  #Depression/Anxiety  Mood stable, no SI, no anxiety  - Hold home buspirone 15 mg BID, duloxetine DR 60 while sedated  ???  #PAD  s/p femoral-femoral graft 2009.  - Hole home clopidogrel 75 mg daily  ???  #HLD chronic/stable  - Hold home Pravastatin 40 mg qhs  ???  #DM2 with associated diabetic peripheral neuropathy  - Hold insulin while NPO  - Hole home gabapentin 800 mg TID   ???  F - TPN started 1/3, consult to pharmacy to double concentrate all drips  A -  Sedated  S - Fent/precedex/propofol  T - SQ heparin  H - 30 degrees  U - PPI BID  G - NPO  B - Senna qhs PRN  I - ETT 8.41mm (upsided 1/1), PIV 22g R hand (12/28), CVC 3-Lumen L fem (1/1), A-line (12/31), OGT (12/31), PIV x 1 L hand    Disposition: ICU level care required for hypoxemic respiratory failure requiring intubation  ???  Code Status: Full Code  Contact:  Primary Emergency ContactEgan, [place], Home Phone: [phone]    Discussed with attending: Eliane Decree., MD, PhD    Ilene Qua, MD

## 2017-03-03 NOTE — Progress Notes
.  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    -- Lines/Tubes --   ETT (size 8.68mm), CVC 3-lumen L fem, A-line, NGT  -- Drips --  Fent, Precedex, propofol, levophed, d/c'd vasopressin  -- Antimicrobials --  Zosyn, Levofloxacin  ???    IE/SUBJECTIVE:   IE:  - Bactrim d/c'd. Growing mycobacteria in sputum, patient does not have Nocardia.  - Continue levaquin  - Spotted with lasix and thiazide overnight, net +277  - P/F ratio worsening, PaO2 on ABGs worsening. Increased PEEP and RR requirements  - Preliminary liver bx negative for malignancy but sample inadequate    Subjective:   - intubated and sedated. Unable to assess    OBJECTIVE:   VITALS:  Temp:  [36 ???C (96.8 ???F)-37.6 ???C (99.7 ???F)] 36.6 ???C (97.9 ???F)  Heart Rate:  [62-82] 64  Resp:  [14-26] 26  Arterial Line BP (mmHg): (77-143)/(42-67) 115/59  MAP:  [58 mmHg-92 mmHg] 78 mmHg  FiO2 (%):  [70 %] 70 %  SpO2:  [83 %-95 %] 93 %  01/04 0701 - 01/05 0700  In: 5106.6 [I.V.:1065.6]  Out: 3860 [Urine:3660]     Wt Readings from Last 1 Encounters:   02/28/17 (!) 102.2 kg (225 lb 5 oz)     Recent Labs      03/03/17   0416  03/02/17   2358  03/02/17   1952  03/02/17   1545   GLUCOSEPOC  277*  220*  215*  284*   }  Oxygen Therapy  SpO2: 93 %  O2 Device: ETT, Mechanical Ventilator  FiO2 (%): 70 %  O2 Flow Rate (L/min): 40 L/min     Vent Mode: VC  FiO2 (%):  [70 %] 70 %  Rate:  [18-26] 26  VT (mL):  [480 mL] 480 mL  PEEP (setting):  [10 cmH2O-12 cmH2O] 12 cmH2O    PHYSICAL EXAM:  Unchanged from yest  General - Intubated/Sedated  Neuro - Sedated, +gag, pupils reactive Cardiovascular - RRR no m/r/g, unable to assess JVP d/t girth, 2+ radial pulses b/l  Lungs - No audible cuff leak, no wheezing anteriorly, mechanical breath sounds  Skin - No visible rashes or lesions  Abdomen - Central obesity, soft, NT/ND  Extremeties - Warm and well-perfused. 1+ LE edema    LABORATORY DATA:   BMP  Recent Labs      03/03/17   0423  03/02/17   1545  03/02/17   0400   03/01/17   0409   NA  137  140  136   < >  134*   K  4.1  3.6  3.0*   < >  3.9   CL  98  97  94*   < >  94*   CO2  22  21  23    < >  21   BUN  32*  29*  25*   < >  21   CREAT  1.22  1.29  1.32*   < >  1.28   CALCIUM  8.5*  8.6  8.6   < >  8.6   MG  2.2*  2.5*  1.8   < >  2.0*   PHOS  1.2*   --   3.4   --   5.0*    < > = values in this interval not displayed.     CBC  Recent Labs      03/03/17   0423  03/02/17   0400  03/01/17   0409   WBC  8.47  7.38  7.93   HGB  10.0*  10.1*  10.7*   HCT  30.8*  31.0*  32.1*   MCV  87.7  87.1  85.8   PLT  170  188  186     Coags  No results for input(s): INR, PT, APTT in the last 72 hours.  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  HIV - neg  Legionella Urinary Ag - neg  Chlamydia/gonorrhea PCR - neg  RPR nonreactive    IMAGING:     XR portable AP (1/5) S/p thora: -Unchanged bilateral mediastinal and bilateral hilar fullness, consistent with reactive lymphadenopathy.  -Unchanged patchy and nodular airspace consolidation involving the central left lung, consistent with atypical pneumonia.  -Decreased small left pleural effusion. No pneumothorax postthoracentesis.    XR portable AP (1/5) Prior to Thora:  -Unchanged bilateral mediastinal and bilateral hilar fullness, consistent with reactive lymphadenopathy.  -Unchanged patchy and nodular airspace consolidation involving the central left lung, consistent with atypical pneumonia.  -Unchanged small left pleural effusion.    ASSESSMENT & PLAN:   Ryan Shepard???is a 77 y.o.???male???with a history of COPD (on 3LNC), HFpEF, afib, HTN, DM2, PAD s/p femoral graft, prior prostate CA who was admitted for hypoxemic respiratory failure requiring intubation.    RESP:  #Acute on chronic hypoxemic respiratory failure  Admitted from clinic requiring BiPAP in setting of decreased arterial PaO2 and desats to low 80s. Recently admitted w/ bronch showing chronic colonization with aspergillus. Has features c/f malignancy on imaging. Respiratory status continues to worsen with worsened xray. S/p upsized ETT on 1/1 w/ resolution of cuff leak. Initial c/f PE, CTA read no PE so d/c'd heparin gtt, LE dopplers negative for DVT.   - ARDS NET protocol  - Serial ABGs   - Malignancy work-up, as below  - Antibiotics, as below  ???  #Chronic COPD  Increased sputum production x 2 months, slight wheezing and recently treated with steroids while hospitalized. Current presentation less c/w  COPD exacerbation.  - Duonebs q6h  - Inhaled Flovent BID  - Empiric abx, as below  ???  CV:  #Hypovolemic shock in setting of sedation  Patient requires significant sedation d/t air leak on cuff of ETT, patient changing position can increase air leak. Hypotension requiring pressors in setting of sedation but elevated lactate and signs of end-organ damage including rising Cr. Patient has leukocytosis and on empiric abx, no other signs of infection.  - A-line for accurate BP monitoring  - Pressors: off levophed  - Cont hydrocortisone and fludrocortisone (1/2- )  - Trend lactate  - Goal MAPs > 60 on A-line    #HFpEF  Home dose lasix PO 40mg  BID. TTE shows hyperdynamic EF >75% and elevated PASP to  - S/p albumin x 1 on 1/2  - Start Martin lasix protocol: albumin 25g/25% q8h x 72hr, lasix 20mg  IV x 1, lasix gtt x 72hr (started at 5mg /hr and titrate to loss of 1kg/day).  - Goal net -1L over 24 hrs  - BID lytes, daily weights, strict I&O's     #Afib  Rate-controlled. CHADS2VaSC 3-4  - Hold home carvedilol 25 mg BID  - Hold home apixaban 5 mg BID    NEURO:  #Intubated and sedated 2/2 hypoxemic respiratory failure  - Cont sedation with fentanyl/precedex/propofol  - TGs WNL    ID:  #Leukocytosis, improving  Afebrile. Increased sputum production x 2 months otherwise no localizing signs of infection. Did receive course of methylprednisolone 12/18-12/23 on recent hospitalization. 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 +Mycobacteria 12/13 BAL resulted on 03/01/17, not nocardia as previously thought.  - ID consulted, appreciate recs  - S/p IV Vanc  - S/p  Azithromycin  - F/u urine histoplasma Ag (sendout lab), hold antifungal tx at this time  - S/p IV Zosyn  - Cont levofloxacin for atypical coverage GN coverage (1/2- )  - D/c IV Bactrim 500mg  q8hr given that patient does not have pulmonary nocardia  ???  HEME:  #Hypervascular Liver Lesions/Mediastinal LAD- c/f malignancy, hx prostate cancer treated with total prostatectomy. Current presentation more amenable to IR biopsy of liver lesions, EBUS less given accessibility of lung lesions.  - IR liver biopsy done 1/3, f/u on final path  ???  CHRONIC  #HTN  - Hold home BP meds in setting of shock: nifedipine 30mg  daily, losartan 100 mg daily, carvedilol 25 mg BID  ???  #Chronic???pain Localized to lower back and lower extremities  - Hold home Norco 5-325 q6h PRN while sedated  ???  #GERD chronic/stable  - Pantoprazole PO 40 mg daily 12/31  ???  #Depression/Anxiety  Mood stable, no SI, no anxiety  - Hold home buspirone 15 mg BID, duloxetine DR 60 while sedated  ???  #PAD  s/p femoral-femoral graft 2009.  - Hole home clopidogrel 75 mg daily  ???  #HLD chronic/stable  - Hold home Pravastatin 40 mg qhs  ???  #DM2 with associated diabetic peripheral neuropathy  - ISS#3 now that TPN started  - Hole home gabapentin 800 mg TID   ???  F - TPN started 1/3, consult to pharmacy to double concentrate all drips. q6h refeeding labs, supp K/Mg/Phos PRN  A - Sedated  S - Fent/precedex/propofol  T - SQ heparin  H - 30 degrees  U - PPI BID  G - Accuchecks q4h while on TPN  B - Senna qhs PRN  I - ETT 8.58mm (upsided 1/1), PIV 22g R hand (12/28), CVC 3-Lumen  L fem (1/1), A-line (12/31), OGT (12/31), PIV x 1 L hand    Disposition: ICU level care required for hypoxemic respiratory failure requiring intubation  ???  Code Status: Full Code  Contact:  Primary Emergency ContactAstin, [place], Home Phone: [phone]    Discussed with attending: Eliane Decree., MD, PhD    Jess Barters, PGY-1  [phone]      Pt seen, evaluated and examined with housestaff.  Please refer to my note for my impression and plan.    Eliane Decree MD PhD  Associate Clinical Professor of Medicine  Pulmonary & Critical Care Medicine  [employer] at [facility]

## 2017-03-03 NOTE — Other
Patients Clinical Goal:   Clinical Goal(s) for the Shift: maintain MAP>65, SpO2>88<92% ARDS NET, maintain safety, keep pt sedated RAS-2, labs, teaching, CHG bath, replace electrolytes as needed, strict I&O goal -500  Identify possible barriers to advancing the care plan:  Stability of the patient: Moderately Unstable - medium risk of patient condition declining or worsening    End of Shift Summary: MAP>65, SpO2>88<92% ARDS NET, pt is safe and sedated, RAS-2, labs sent, teaching is done, CHG is done, electrolytes were replaced most of the night, pt was diuresed but I&O goal wasn't reached.

## 2017-03-03 NOTE — Progress Notes
Pharmaceutical Services ??? Parenteral Nutrition    - Yesterday and this morning, has received about 160 mEq of K+ and phos low this morning. Increase KPhos from 10 mmol to 30 mmol and KCl from 40 mEq to 60 mEq, total K+ increase from 55 to 104 mEq.  - BGs poorly controlled, was on Novolog TID w/ meals PRN, start Novolog SQ every 4 hrs PRN algo #3. Has 35 units of insulin in TPN, prefer to start SQ regimen now.       TPN Medication Recent History (Show up to 2 orders; newest on the left. Changes between the two most recent orders are indicated.)     Start date and time   03/03/2017 2200 03/02/2017 2200      TPN Adult [[id number]] TPN Adult [[id number]]    Order Status  Active Active    Last Given   03/02/2017 2200       Macro Ingredients    amino acids (Travasol)  6 % 6 %    dextrose  18 % 18 %       QS Base    sterile water for inj  163.97 mL 180.64 mL       Electrolytes PER DAY    sodium chloride  70 mEq 70 mEq    sodium acetate  70 mEq 70 mEq    potassium chloride  60 mEq 40 mEq    potassium phosphate  30 mmol 10 mmol    calcium gluconate  5 mEq 5 mEq    magnesium sulfate  20 mEq 20 mEq       Medications/Additives PER DAY    trace elements (MTE-4 Conc)  1 mL 1 mL    selenium  60 mcg 60 mcg    multiple vitamin (adult)  10 mL 10 mL    insulin regular  35 Units 35 Units       RX Fee    ADULT TPN FEE (RX USE ONLY)  1,992 mL 1,992 mL       Energy Contribution    Proteins  478 kcal 478 kcal    Dextrose  1,218.56 kcal 1,218.56 kcal    Lipids  -- --    Total  1,696.56 kcal 1,696.56 kcal       Electrolyte Ion Calculated Amount    Sodium  140 mEq 140 mEq    Potassium  104 mEq 54.67 mEq    Calcium  5 mEq 5 mEq    Magnesium  20 mEq 20 mEq    Aluminum  -- --    Phosphate  30 mmol 10 mmol    Chloride  177.8 mEq 157.8 mEq    Acetate  175.16 mEq 175.16 mEq       Other    Total Protein  119.5 g 119.5 g    Total Protein/kg  1.23 g/kg 1.23 g/kg    Glucose Infusion Rate  2.57 mg/kg/min 2.57 mg/kg/min    Osmolarity  -- -- Volume  1,992 mL 1,992 mL    Rate  83 mL/hr 83 mL/hr    Dosing Weight  97 kg (Order-Specific) 97 kg (Order-Specific)    Infusion Site  [facility] [employer] C. Corrinne Eagle, PharmD  Inpatient Staff Pharmacist

## 2017-03-03 NOTE — Consults
Nutrition Note    Consult for: C/f refeeding syndrome, insulin needs to be adjusted in TPN    Completed nutrition assessment see note from 1/3.  TPN regimen: D18%AA6% @ 83 ml/hr (1992 ml/day) and 350 ml IL per day, insulin 35 units per day of TPN at 1/4.  TPN provides 2404 kcals (25 kcals/kg), 120 g protein (1.2 g/kg), 358 g dextrose (GIR 2.56 mg/kg/min) per day.     Labs reviewed, K level 4.1, Phos 1.2 today.   Blood sugar level:   Lab Results     Result   Result      281 (!) 03/03/17 0423 Negative 02/28/17 2118    294 (!) 03/02/17 1545 Negative February 25, 2017 2122    260 (!) 03/02/17 0400 2+ (!) 02/04/17 1256        Plan:  Discussed with pharmacist Molly Maduro), agree with increasing KPhos from 10 mmol to 30 mmol and KCL from 40 mEq to 60 mEq (total K increase from 55 mEq to 104 mEq) in TPN regimen today,   No insulin change for 1/5 TPN, but will increase Novolog SQ every 4 hrs PRN algo #3 (was on Novolog TID PRN only) to monitor blood sugar level trend and insulin coverage need.     Pharmacist will monitor K, Mag, Phos level and blood sugar level trend daily for further electrolytes and insulin dose adjustment need in TPN.    Continue current macronutrient regimen in TPN today. If hyperglycemia and low electrolytes level continues by tomorrow, may benefit from lower dextrose in TPN to re-titrate from 12.5% Dextrose -> 15% Dextrose -> goal 18% Dextrose to promote euglycemia and electrolytes normalization need.     Unit RD follow up with nutrition support tolerance and further adjustment need.       Author: Gertie Baron, MPH RD CNSC; pgr [phone]  Weekend Coverage RD; weekend pager [phone]  03/03/2017 12:49 PM        ------------------------------------------------------------------------    Nutrition     Notified by Greig Castilla, PharmD, TPN to start today, per MD request 2/2 high aspiration risk.   Discussed on morning lightening rounds and with RN.     NPO with OGT to suction with no output noted.   Labs reviewed. Glucose 155-212.    Using admit wt: 97kg  2400-2900cal (25-30cal/kg)  125-145gm pro (1.3-1.5gm/kg)    Recommend start with D18 6%AA 12ml/hr + lipid daily.  Prov: 120gm pro (1.2gm/kg)            2404cal (25cal/kg)  Needs insulin in TPN - per PharmD.  Check Tg after first lipid infusion.  Start TF as soon as possible with Glucerna 1.2 goal 63ml/hr.    Valene Bors, MS, RD pg. [phone]

## 2017-03-03 NOTE — Nursing Note
1420: Time out taken, consent in the patient's chart.    1440: Start of thoracentesis w/ Dr. Chauncey Cruel.

## 2017-03-03 NOTE — Nursing Note
1900 Received report.   2000 Assessments done. Teaching is done. Labs sent. Medications given. Paged Md regarding blood glucose level.  2100 lab results reviewed.   2200 CHG is done.   0000 Assessments done. Teaching is done. Medications given. Labs sent.  0100 lab results reviewed and paged to Md and RT.  0400 Assessments done. Teaching is done. Labs sent. Medications is given.   0500 lab results reviewed and paged to Md.   0600 medications given.   0630 Electrolytes ordered to replace.

## 2017-03-03 NOTE — Progress Notes
INFECTIOUS DISEASES PROGRESS NOTE    Patient: Ryan Shepard  MRN: [id number]  DOB: [birth year]  Date of Service: 03/03/2017  Requesting Physician: Ryan Shepard., MD, PhD  Reason for Consultation: R/o Aspergillus Pneumonia    Chief Complaint: Hypoxia    History of Present Illness:   Ryan Shepard is a 77 y/o M with COPD, tobacco use disorder, PAD s/p femoral graft, HTN, DM2, HLD, Afib, GERD, depression, anxiety, chronic pain readmitted on 12/28 following discharge to home on 12/26 with recurrence of dyspnea.     Recent hospital course as follows:  12/6: Admitted with a COPD exacerbation in the setting of diagnosed COPD and extensive tobacco use disorder. CXR on admission showing confluent airspace opacities c/f multifocal aspiration/pneumonia. Procal neg. Treated with levaquin (12/6- ) x 7 days and 5 day course of prednisone.   12/11: Patient was planned for discharge but then was found on bacterial resp culture from admission (12/6) to have aspergillus species, few probably candida. Other micro including legionella urine ag neg, MRSA nares neg, HIV neg. Had CT chest done showing airspace and nodular consolidation within the left upper lobe and lingula and lesser within the right upper and right lower lobes consistent with pneumonia, likely fungal. Also with bulky intrathoracic multicompartamental lymphadenopathy. 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 Started on acyclovir 800 mg BID x 5 days. Gonorrhea/chlamydia neg. RPR pending. Bacterial culture no growth to date. HIV neg.   12/12: VSS, no other acute changes. Requiring 3 L NC. No changes in respiratory status.   12/13: VSS. No acute changes. Pulmonology consulted, recommended bronch,  performed same day. Pt states his SOB has improved drastically. No fevers, chills, nausea, vomiting. No new culture data. 12/14: Events noted; the patient became increasingly hypoxic after the bronchoscopy despite using a 15 liter face mask. The blood also noted hypercapnia.  According to the notes the patient reported worsening cough associated blood tinged sputum, but today he claims there was no change in his breath or cough. He was transferred to ICU for further monitoring and started on voriconazole, piperacillin-tazobactam and vancomycin. He denied fever, chills chest pain or changes in his breathing.  12/15: Remains AF with normal WBC. Reports he is coughing more today but breathing feels better.   12/16: Transferred to the floor, AF  12/23: Completed 14 day course of antibiotic treatment (Vanco/Zosyn/Vori x 3 days; Ceftriaxone/Flagyl x 6 days; Vanco/Zosyn x 5 days)  12/26: Discharged home  12/28: Seen in Medicine clinic for routine follow-up, noted to have O2Sat in low 70's on 4L NC, readmitted to hospital with BiPap treatment, Vanco/Zosyn/Azithromycin restarted      Hospital Course (Key Events): Date of Admission 02/18/2017   12/28: Admitted  12/31: On Bipap this AM, continued productive cough. Intubated.  1/1: BAL w/ thick mucous, therapeutic suctioning  1/2: AF, intubated, FiO2 80%, PEEP 10, levo at 0.18. WBC 11 from 13s, lactate still elevated 28. Pt sedated and non-responsive. Transitioned to ARDSnet protocol. Started levofloxacin. Started hydrocort and fludrocort.  1/3: Tmax 38.3 yesterday, FiO2 improved to 60%, PEEP still 10, WBC 7 from 10, Cr 1.2 from 1.4, levophed off and on vaso 2.4. Intubated, sedated. 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 Head CT wo abscess.  1/4: Tmax 38, levo at 0.02, 70% FiO2, PEEP 10 on vent. Pt intubated and sedated on vent. Discussed pt w/ wife  at bedside.     1/5: AF, pressors off, FiO2 still 70%, PEEP up to 12. Intubated/sedated on vent. Planning to prone this afternoon per RN.    Pertinent antimicrobial courses:  Bactrim IV 1/3-  Levofloxacin 1/2- Pip-tazo 12/28-1/4  Azithromycin 12/28-1/2  Vancomycin 12/28-30    Review of Systems:  Unable to perform as pt intubated and sedated    Past Medical History:  Past Medical History:   Diagnosis Date   ??? Cancer (HCC/RAF)    ??? Diabetes (HCC/RAF)    ??? GERD (gastroesophageal reflux disease)    ??? Hypercholesteremia    ??? Hypertension    ??? PAD (peripheral artery disease) (HCC/RAF)    ??? 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 Prostate disease    ??? Scoliosis     adolesent   ??? Vascular disease       Allergies:   No Known Allergies    Medications:  Scheduled Meds:  ??? albuterol  2.5 mg Nebulization Q6H   ??? chlorhexidine  10 mL Mouth/Throat BID   ??? cotrimoxazole IV  500 mg tmp Intravenous Q8H   ??? fludrocortisone  0.05 mg Oral Daily   ??? furosemide  60 mg IV Push BID (diuretic)   ??? heparin  5,000 Units Subcutaneous BID   ??? hydrocortisone IV  50 mg Intravenous Q6H   ??? ipratropium  0.5 mg Nebulization Q6H   ??? levoFLOXacin  750 mg Intravenous Q48H   ??? pantoprazole  40 mg IV Push Q24H   ??? polyethylene glycol  17 g Oral Daily   ??? potassium phosphate IV  18 mmol Intravenous Once   ??? senna  1 tablet Oral QHS     Continuous Infusions:  ??? dexmedetomidine 4 mcg/mL drip 0.7 mcg/kg/hr (03/03/17 0600)   ??? fentaNYL 2.5 mg/100 mL drip 175 mcg/hr (03/03/17 0600)   ??? norepinephrine drip Stopped (03/03/17 0400)   ??? propofol drip 35 mcg/kg/min (03/03/17 0600)   ??? sodium chloride 5 mL/hr (02/26/17 0041)   ??? sodium chloride 5 mL/hr (02/27/17 1610)   ??? TPN Adult 83 mL/hr at 03/02/17 2200     PRN Meds:.acetaminophen, insulin aspart **AND** dextrose, oxyCODONE      Physical Exam:  Temp:  [36.1 ???C (97 ???F)-37.6 ???C (99.7 ???F)] 36.4 ???C (97.5 ???F)  Heart Rate:  [62-82] 64  Resp:  [14-26] 26  Arterial Line BP (mmHg): (77-143)/(42-67) 117/59  MAP:  [58 mmHg-92 mmHg] 78 mmHg  FiO2 (%):  [70 %] 70 %  SpO2:  [85 %-95 %] 92 %  Temp (24hrs), Avg:36.9 ???C (98.4 ???F), Min:36.1 ???C (97 ???F), Max:37.6 ???C (99.7 ???F)    Intake and Output: Last Two Completed Shifts:  I/O last 2 completed shifts:  In: 5366.2 [I.V.:1159.2; Other:30; NG/GT:30; IV Piggyback:2100]  Out: 4490 [Urine:4290; Emesis/GI output:200]  Vitals:    02/28/17 0500   Weight: (!) 102.2 kg (225 lb 5 oz)   Height:      Gen: Older AA man, intubated, sedated  HEENT: Sclerae anicteric, ETT in place  Neck: Soft, supple without lymphadenopathy.  CV: Regular rate, normal S1/S2. No murmurs appreciated.   Chest: Rhonchorous BL anteriorly  Abdomen: Soft, non-distended, non-tender, normoactive bowel sounds  Musculoskeletal: No clubbing, cyanosis, or edema.   Neuro: Sedated    Labs:  Lab Results   Component Value Date    WBC 8.47 03/03/2017    HGB 10.0 (L) 03/03/2017    HCT 30.8 (L) 03/03/2017  MCV 87.7 03/03/2017    PLT 170 03/03/2017     Lab Results   Component Value Date    CREAT 1.22 03/03/2017    BUN 32 (H) 03/03/2017    NA 137 03/03/2017    K 4.1 03/03/2017    CL 98 03/03/2017    CO2 22 03/03/2017     Lab Results   Component Value Date    ALT 18 02/08/2017    AST 20 02/08/2017    ALKPHOS 50 02/08/2017    BILITOT 0.4 02/08/2017       Microbiology:   1/3 Resp cx Probable C.albicans  1/2 BCx x2 NGTD  1/2 UA neg  12/31 BAL cx - neg or NGTD  12/28 Resp Cx Probable C.albicans  12/28 Blood Cx NTD  12/28 RVP neg  12/18 Resp cx Neg (C albicans)  12/13 BAL Nocardia cx: Rare beaded Gram positive rods  12/13 BAL cx Neg  12/13 Respiratory culture: Candida, otherwise negative so far; AFB smear negative x 2 (AFB cultures NTD)  12/13 Legionella culture neg  Aspergillus Ag EIA -  <0.50  RVP panel negative  Aspergillus fumigatus (M3) IgE - NEG   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  HIV - neg  Legionella Urinary Ag - neg  Chlamydia/gonorrhea PCR - neg  RPR nonreactive Imaging Reviewed by Me:   1/3 CTH w con: No acute intracranial hemorrhage or mass effect. No abscess is seen.    1/3 US liver bx: Technically successful ultrasound-guided core needle biopsy of segment 8 hypodense lesion.     1/3 CXR: My read - ETT in place, BL airspace disease, perhaps slightly worse from prior    12/28 CXR  Unchanged enlarged cardiomediastinal silhouette from combination of mediastinal adenopathy and left hilar mass extending into upper lobe, and lower lobe with left lymphangitic carcinomatosis. Unchanged moderate left pleural effusion.    12/20 CT Chest:  1.   Redemonstration of coalescent airspace and nodular consolidation, most prominent in the left upper lobe and lingula with peripheral groundglass associated with Unchanged to slightly more prominent mediastinal and bilateral hilar lymphadenopathy.   This finding is nonspecific but concerning for malignancy versus pneumonia with etiologies including fungal infection.   2.  Increase in size of pleural and pericardial effusions and anasarca consistent with volume overload.  3.   Dilated main pulmonary artery measuring 39 mm reflecting pulmonary arterial hypertension.  4.  Severe coronary artery calcifications.    12/20 CT A/P:  History of prostate cancer treated with total prostatectomy with at least 4 hypervascular hepatic masses measuring up to 5 cm in the right hepatic lobe, suspicious for metastatic disease. No abdominal or pelvic lymphadenopathy.  Severe atherosclerotic disease with thrombosed left common, external, and femoral artery, and patent femorofemoral bypass.      Assessment:   77yo M w/ h/o COPD, prostate CA with possible metastatic disease, recent admission for dyspnea treated with 14 days antibiotics, now readmitted with hypoxia but pt comfortable on exam. ID consulted for further w/u and management.    Pt initially w/ severe hypoxia on supplemental O2, diffuse nodular opacities in lungs, and continued production of purulent sputum, requiring intubation and ARDSnet protocol. Recurrent hypoxia likely secondary to a persistent or recurrent bacterial infection secondary to obstructive  pulmonary process (COPD +/- lung metastases). Treating empirically for recurrent PNA. S/p 5 days of azithromycin and several days of empiric pip-tazo. Started on levofloxacin 1/2. 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    Although concern for possible aspergillus pneumonia is reasonable in this patient with persistent lung disease and imaging consistent with fungal infection, only A [place] (not a common cause of pulmonary disease) has been isolated on a single expectorated sputum sample, with no growth on multiple other respiratory cultures, including a BAL culture, and negative galactomannan assay (while on Zosyn) so we are considering this colonization/contamination.     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 Will need at least 3-50mo of treatment.  - Cont empiric levofloxacin 750mg  IV daily (1/2- ) for copd exacerbation  - F/u urine and serum histo Ag     Thank you for this consultation. We will continue to follow with you. Please page [phone] (General ID) with any questions.  DWA Dr. Manson Passey  Recommendations d/w primary ICU team.    Author:  Jeanne Ivan, MD  Infectious Disease Fellow, PGY-4  03/03/2017  8:00 AM     --------  ID Attending: Pt seen and examned, chart and data personally reviewed, plan formulated together with the ID fellow Dr. Cherly Hensen.  I agree w the note above.    Arlyn Dunning, MD  p [phone]

## 2017-03-04 ENCOUNTER — Ambulatory Visit: Payer: PRIVATE HEALTH INSURANCE

## 2017-03-04 LAB — Albumin,Fluid: ALBUMIN,FLUID: 1.4 g/dL

## 2017-03-04 LAB — Blood Lactate: BLOOD LACTATE: 34 mg/dL — ABNORMAL HIGH (ref 5–25)

## 2017-03-04 LAB — Glucose,POC
GLUCOSE,POC: 237 mg/dL — ABNORMAL HIGH (ref 65–99)
GLUCOSE,POC: 213 mg/dL — ABNORMAL HIGH (ref 65–99)
GLUCOSE,POC: 219 mg/dL — ABNORMAL HIGH (ref 65–99)
GLUCOSE,POC: 244 mg/dL — ABNORMAL HIGH (ref 65–99)
GLUCOSE,POC: 204 mg/dL — ABNORMAL HIGH (ref 65–99)
GLUCOSE,POC: 211 mg/dL — ABNORMAL HIGH (ref 65–99)
GLUCOSE,POC: 163 mg/dL — ABNORMAL HIGH (ref 65–99)
GLUCOSE,POC: 157 mg/dL — ABNORMAL HIGH (ref 65–99)

## 2017-03-04 LAB — Basic Metabolic Panel
CALCIUM: 8.6 mg/dL (ref 8.6–10.4)
CHLORIDE: 94 mmol/L — ABNORMAL LOW (ref 96–106)

## 2017-03-04 LAB — Potassium
POTASSIUM: 3.6 mmol/L (ref 3.6–5.3)
POTASSIUM: 4.2 mmol/L (ref 3.6–5.3)

## 2017-03-04 LAB — Bacterial Culture-Gm Stain
BACTERIAL CULTURE-GM STAIN: NEGATIVE
GRAM STAIN (GENERAL): NONE SEEN

## 2017-03-04 LAB — Cholesterol,Fluid: CHOLESTEROL,FLUID: 46 mg/dL

## 2017-03-04 LAB — Calcium,Ionized: IONIZED CA++,CORRECTED: 1.09 mmol/L (ref 1.09–1.29)

## 2017-03-04 LAB — Magnesium
MAGNESIUM: 2.1 meq/L — ABNORMAL HIGH (ref 1.4–1.9)
MAGNESIUM: 1.9 meq/L (ref 1.4–1.9)
MAGNESIUM: 2.2 meq/L — ABNORMAL HIGH (ref 1.4–1.9)
MAGNESIUM: 2.1 meq/L — ABNORMAL HIGH (ref 1.4–1.9)

## 2017-03-04 LAB — Comprehensive Metabolic Panel
ALBUMIN: 3.2 g/dL — ABNORMAL LOW (ref 3.9–5.0)
CREATININE: 1.22 mg/dL (ref 0.60–1.30)
GLUCOSE: 247 mg/dL — ABNORMAL HIGH (ref 65–99)
SODIUM: 139 mmol/L (ref 135–146)

## 2017-03-04 LAB — pH, Fluid: PH, FLUID: 7.44

## 2017-03-04 LAB — Blood Gases, arterial
BICARBONATE: 28.2 mmol/L — ABNORMAL HIGH (ref 22.0–26.0)
PCO2: 48 mmHg — ABNORMAL HIGH (ref 38–42)
PCO2: 52 mmHg — ABNORMAL HIGH (ref 38–42)
PCO2: 48 mmHg — ABNORMAL HIGH (ref 38–42)
PH: 7.37 mmHg (ref 7.37–7.41)
PO2: 76 mmHg — ABNORMAL LOW (ref 98–118)

## 2017-03-04 LAB — Fungal Culture: FUNGAL CULTURE: NEGATIVE

## 2017-03-04 LAB — Triglycerides: TRIGLYCERIDES: 83 mg/dL (ref <150)

## 2017-03-04 LAB — Fungal Stain: FUNGAL STAIN_FIRST: NONE SEEN

## 2017-03-04 LAB — Phosphorus
PHOSPHORUS: 2.3 mg/dL (ref 2.3–4.4)
PHOSPHORUS: 1.8 mg/dL — ABNORMAL LOW (ref 2.3–4.4)
PHOSPHORUS: 2.7 mg/dL (ref 2.3–4.4)
PHOSPHORUS: 2.6 mg/dL (ref 2.3–4.4)

## 2017-03-04 LAB — LD, Fluid: LD, FLUID: 410 U/L

## 2017-03-04 LAB — Glucose,Fluid: GLUCOSE, FLUID: 302 mg/dL

## 2017-03-04 LAB — Acid-Fast Culture and Stain: ACID-FAST STAIN (FLUOROCHROME): NONE SEEN

## 2017-03-04 LAB — Lactate Dehydrogenase: LACTATE DEHYDROGENASE: 432 U/L — ABNORMAL HIGH (ref 125–256)

## 2017-03-04 LAB — Bilirubin,Conjugated: BILIRUBIN,CONJUGATED: <0.2 mg/dL (ref <=0.3)

## 2017-03-04 LAB — Body Fluid Differential: TOTAL CELL RECOVERED: 100

## 2017-03-04 LAB — Protein,Total,Fluid: PROTEIN,TOTAL,FLUID: 2.6 g/dL

## 2017-03-04 LAB — CBC: PLATELET COUNT, AUTO: 149 10*3/uL (ref 143–398)

## 2017-03-04 LAB — Differential Automated: LYMPHOCYTE PERCENT, AUTO: 6.3 (ref 0.00–0.04)

## 2017-03-04 LAB — Body Fluid Cell Count: WBC COUNT,FLUID: 435 /mm3

## 2017-03-04 MED ADMIN — PROPOFOL INFUSION 10 MG/ML: 7755 ug/min | INTRAVENOUS | @ 22:00:00 | Stop: 2017-03-14

## 2017-03-04 MED ADMIN — INSULIN ASPART 100 UNIT/ML SC SOPN: 3 mL | SUBCUTANEOUS | @ 20:00:00 | Stop: 2017-03-10 | NDC 00169633910

## 2017-03-04 MED ADMIN — ALBUMIN HUMAN 25 % IV SOLN: 25 g | INTRAVENOUS | @ 08:00:00 | Stop: 2017-03-06 | NDC 68516521601

## 2017-03-04 MED ADMIN — CHLORHEXIDINE GLUCONATE 0.12 % MT SOLN: 10 mL | OROMUCOSAL | @ 05:00:00 | Stop: 2017-03-13 | NDC 52376002110

## 2017-03-04 MED ADMIN — HYDROCORTISONE NA SUCCINATE PF 100 MG IJ SOLR: 50 mg | INTRAVENOUS | @ 01:00:00 | Stop: 2017-03-05 | NDC 00009001104

## 2017-03-04 MED ADMIN — SENNOSIDES 8.6 MG PO TABS: 1 | ORAL | @ 06:00:00 | Stop: 2017-03-06 | NDC 00904652261

## 2017-03-04 MED ADMIN — CEFEPIME 1 GM/50 ML RTU: 1 g | INTRAVENOUS | @ 19:00:00 | Stop: 2017-03-08 | NDC 00338130141

## 2017-03-04 MED ADMIN — DEXMEDETOMIDINE 4 MCG/ML DRIP: 20.68 ug/h | INTRAVENOUS | @ 07:00:00 | Stop: 2017-03-04 | NDC 55150020902

## 2017-03-04 MED ADMIN — PANTOPRAZOLE SODIUM 40 MG IV SOLR: 40 mg | INTRAVENOUS | @ 08:00:00 | Stop: 2017-03-13 | NDC 55150020210

## 2017-03-04 MED ADMIN — POTASSIUM CHLORIDE 20 MEQ/50ML RTU: 20 meq | INTRAVENOUS | @ 12:00:00 | Stop: 2017-03-04 | NDC 00338070341

## 2017-03-04 MED ADMIN — LEVOFLOXACIN 750 MG/150 ML RTU: 750 mg | INTRAVENOUS | @ 22:00:00 | Stop: 2017-03-08 | NDC 00143972024

## 2017-03-04 MED ADMIN — POLYETHYLENE GLYCOL 3350 17 G PO PACK: 17 g | ORAL | @ 16:00:00 | Stop: 2017-03-06 | NDC 68084043098

## 2017-03-04 MED ADMIN — INSULIN ASPART 100 UNIT/ML SC SOPN: 3 mL | SUBCUTANEOUS | @ 01:00:00 | Stop: 2017-03-10 | NDC 00169633910

## 2017-03-04 MED ADMIN — IPRATROPIUM BROMIDE 0.02 % IN SOLN: 500 ug | RESPIRATORY_TRACT | @ 01:00:00 | Stop: 2017-03-07 | NDC 00487980101

## 2017-03-04 MED ADMIN — ALBUTEROL SULFATE (5 MG/ML) 0.5% IN NEBU: 2.5 mg | RESPIRATORY_TRACT | @ 01:00:00 | Stop: 2017-03-07 | NDC 00487990130

## 2017-03-04 MED ADMIN — FUROSEMIDE 10 MG/ML DRIP (STRAIGHT DRUG): 30 mg/h | INTRAVENOUS | @ 12:00:00 | Stop: 2017-03-09

## 2017-03-04 MED ADMIN — PROPOFOL INFUSION 10 MG/ML: 7755 ug/min | INTRAVENOUS | @ 13:00:00 | Stop: 2017-03-14 | NDC 63323026965

## 2017-03-04 MED ADMIN — INSULIN ASPART 100 UNIT/ML SC SOPN: 3 mL | SUBCUTANEOUS | @ 08:00:00 | Stop: 2017-03-10 | NDC 00169633910

## 2017-03-04 MED ADMIN — PROPOFOL INFUSION 10 MG/ML: 7755 ug/min | INTRAVENOUS | @ 04:00:00 | Stop: 2017-03-14 | NDC 63323026965

## 2017-03-04 MED ADMIN — MAGNESIUM SULFATE 2 GM/50ML IV SOLN: 2 g | INTRAVENOUS | @ 08:00:00 | Stop: 2017-03-04 | NDC 63323010605

## 2017-03-04 MED ADMIN — ALBUTEROL SULFATE (5 MG/ML) 0.5% IN NEBU: 2.5 mg | RESPIRATORY_TRACT | @ 07:00:00 | Stop: 2017-03-07 | NDC 00487990130

## 2017-03-04 MED ADMIN — HYDROCORTISONE NA SUCCINATE PF 100 MG IJ SOLR: 50 mg | INTRAVENOUS | @ 20:00:00 | Stop: 2017-03-05 | NDC 00009001104

## 2017-03-04 MED ADMIN — IPRATROPIUM BROMIDE 0.02 % IN SOLN: 500 ug | RESPIRATORY_TRACT | @ 14:00:00 | Stop: 2017-03-07 | NDC 00487980101

## 2017-03-04 MED ADMIN — PROPOFOL INFUSION 10 MG/ML: 7755 ug/min | INTRAVENOUS | @ 10:00:00 | Stop: 2017-03-14 | NDC 63323026965

## 2017-03-04 MED ADMIN — DEXMEDETOMIDINE 4 MCG/ML DRIP: 20.68 ug/h | INTRAVENOUS | @ 07:00:00 | Stop: 2017-03-04

## 2017-03-04 MED ADMIN — FUROSEMIDE 10 MG/ML DRIP (STRAIGHT DRUG): 30 mg/h | INTRAVENOUS | @ 23:00:00 | Stop: 2017-03-09

## 2017-03-04 MED ADMIN — POTASSIUM CHLORIDE 20 MEQ/50ML RTU: 20 meq | INTRAVENOUS | @ 08:00:00 | Stop: 2017-03-04 | NDC 00338070341

## 2017-03-04 MED ADMIN — FENTANYL 2.5 MG/100 ML NS DRIP: 50 ug/h | INTRAVENOUS | @ 22:00:00 | Stop: 2017-03-13

## 2017-03-04 MED ADMIN — HEPARIN SODIUM (PORCINE) 5000 UNIT/ML IJ SOLN: 5000 [IU] | SUBCUTANEOUS | @ 17:00:00 | Stop: 2017-03-06 | NDC 25021040201

## 2017-03-04 MED ADMIN — DEXMEDETOMIDINE 4 MCG/ML DRIP: 20.68 ug/h | INTRAVENOUS | @ 20:00:00 | Stop: 2017-03-06 | NDC 55150020902

## 2017-03-04 MED ADMIN — CHLORHEXIDINE GLUCONATE 0.12 % MT SOLN: 10 mL | OROMUCOSAL | @ 16:00:00 | Stop: 2017-03-13 | NDC 52376002110

## 2017-03-04 MED ADMIN — INSULIN ASPART 100 UNIT/ML SC SOPN: 3 mL | SUBCUTANEOUS | @ 13:00:00 | Stop: 2017-03-10 | NDC 00169633910

## 2017-03-04 MED ADMIN — PROPOFOL INFUSION 10 MG/ML: 7755 ug/min | INTRAVENOUS | @ 07:00:00 | Stop: 2017-03-14 | NDC 63323026965

## 2017-03-04 MED ADMIN — POTASSIUM PHOSPHATE IVPB 100ML (OVER 3 HR): 9 mmol | INTRAVENOUS | @ 10:00:00 | Stop: 2017-03-04 | NDC 00409729501

## 2017-03-04 MED ADMIN — POTASSIUM CHLORIDE 20 MEQ/50ML RTU: 20 meq | INTRAVENOUS | @ 02:00:00 | Stop: 2017-03-04 | NDC 00338070341

## 2017-03-04 MED ADMIN — PROPOFOL INFUSION 10 MG/ML: 7755 ug/min | INTRAVENOUS | @ 19:00:00 | Stop: 2017-03-14 | NDC 63323026965

## 2017-03-04 MED ADMIN — HEPARIN SODIUM (PORCINE) 5000 UNIT/ML IJ SOLN: 5000 [IU] | SUBCUTANEOUS | @ 06:00:00 | Stop: 2017-03-06 | NDC 25021040201

## 2017-03-04 MED ADMIN — ALBUMIN HUMAN 25 % IV SOLN: 25 g | INTRAVENOUS | @ 16:00:00 | Stop: 2017-03-06 | NDC 68516521601

## 2017-03-04 MED ADMIN — IPRATROPIUM BROMIDE 0.02 % IN SOLN: 500 ug | RESPIRATORY_TRACT | @ 20:00:00 | Stop: 2017-03-07 | NDC 00487980101

## 2017-03-04 MED ADMIN — DEXMEDETOMIDINE 4 MCG/ML DRIP: 20.68 ug/h | INTRAVENOUS | @ 07:00:00 | Stop: 2017-03-06 | NDC 55150020902

## 2017-03-04 MED ADMIN — IPRATROPIUM BROMIDE 0.02 % IN SOLN: 500 ug | RESPIRATORY_TRACT | @ 07:00:00 | Stop: 2017-03-07 | NDC 00487980101

## 2017-03-04 MED ADMIN — FENTANYL 2.5 MG/100 ML NS DRIP: 50 ug/h | INTRAVENOUS | @ 21:00:00 | Stop: 2017-03-13 | NDC 00409909461

## 2017-03-04 MED ADMIN — HYDROCORTISONE NA SUCCINATE PF 100 MG IJ SOLR: 50 mg | INTRAVENOUS | @ 08:00:00 | Stop: 2017-03-05 | NDC 00009001104

## 2017-03-04 MED ADMIN — FLUDROCORTISONE ACETATE 0.1 MG PO TABS: .05 mg | ORAL | @ 18:00:00 | Stop: 2017-03-05 | NDC 68084028801

## 2017-03-04 MED ADMIN — FUROSEMIDE 10 MG/ML DRIP (STRAIGHT DRUG): 30 mg/h | INTRAVENOUS | @ 13:00:00 | Stop: 2017-03-09

## 2017-03-04 MED ADMIN — FENTANYL 2.5 MG/100 ML NS DRIP: 50 ug/h | INTRAVENOUS | @ 07:00:00 | Stop: 2017-03-13 | NDC 00409909461

## 2017-03-04 MED ADMIN — ALBUTEROL SULFATE (5 MG/ML) 0.5% IN NEBU: 2.5 mg | RESPIRATORY_TRACT | @ 14:00:00 | Stop: 2017-03-07 | NDC 00487990130

## 2017-03-04 MED ADMIN — HYDROCORTISONE NA SUCCINATE PF 100 MG IJ SOLR: 50 mg | INTRAVENOUS | @ 13:00:00 | Stop: 2017-03-05 | NDC 00009001104

## 2017-03-04 MED ADMIN — TPN (ADULT): 1992 mL | INTRAVENOUS | @ 06:00:00 | Stop: 2017-03-05 | NDC 63323036059

## 2017-03-04 MED ADMIN — PROPOFOL INFUSION 10 MG/ML: 7755 ug/min | INTRAVENOUS | @ 17:00:00 | Stop: 2017-03-14 | NDC 63323026965

## 2017-03-04 MED ADMIN — INSULIN ASPART 100 UNIT/ML SC SOPN: 3 mL | SUBCUTANEOUS | @ 04:00:00 | Stop: 2017-03-10 | NDC 00169633910

## 2017-03-04 MED ADMIN — FUROSEMIDE 10 MG/ML DRIP (STRAIGHT DRUG): 30 mg/h | INTRAVENOUS | @ 18:00:00 | Stop: 2017-03-09

## 2017-03-04 MED ADMIN — ALBUTEROL SULFATE (5 MG/ML) 0.5% IN NEBU: 2.5 mg | RESPIRATORY_TRACT | @ 20:00:00 | Stop: 2017-03-07 | NDC 00487990130

## 2017-03-04 NOTE — Procedures
PATIENT:  Ryan Shepard  MRN:  [id number]  DOB:  [birth year]  DATE OF PROCEDURE:  03/03/2017   TIME OF PROCEDURE:    5:08 PM     Procedure:  Thoracentesis     Informed Consent Discussion:     A discussion regarding the potential benefits, risks, rationale and reasonable alternatives for the above named procedure was conducted with spouse      Procedure:     INDICATIONS:  Pleural effusion    PROCEDURE PHYSICIAN: Newt Lukes, MD  ATTENDING PHYSICIAN:  Eliane Decree., MD, PhD    A time-out was performed, verifying the patient, site and procedure. The patient was positioned appropriately. Ultrasound was used to select an appropriate entry site on the left side above the diaphragm. The overlying skin was prepped using chlorhexidine and draped in sterile fashion. Lidocaine 1% was used to obtain local anesthesia.     The skin was punctured with a Safe-T-Centesis catheter and pleural fluid was aspirated. The catheter was advanced into the pleural space.  1400 mL of clear yellow pleural fluid was aspirated and sent for laboratory analysis.    The procedure was stopped due to obtaining adequate amount of fluid without issues and assessing with real time ultrasound, showing that the lung had almost completely re-expanded.    The catheter was removed. Chest X-ray was ordered. EBL < 5cc.     COMPLICATION(S): None apparent.    CONDITION: The patient tolerated the procedure well.     Dr. Cherly Hensen was present for the procedure.    Newt Lukes, MD  [facility] Pulmonary and Critical Care Fellow  Pgr [phone]

## 2017-03-04 NOTE — Other
Patients Clinical Goal:   Clinical Goal(s) for the Shift: Keep MAP>65, SpO2>88<92% ARDS NET, keep pt sedated RAS-2-3, teaching, labs, CHG.  Identify possible barriers to advancing the care plan:  Stability of the patient: Moderately Stable - low risk of patient condition declining or worsening   End of Shift Summary: MAP>65 pt still off pressors, SpO2>88<92% ARDS NET, pt is sedated and comfortable RAS-2-3, teaching is done, labs sent, CHG provided, electrolytes replaced as needed.

## 2017-03-04 NOTE — Other
Patients Clinical Goal:   Clinical Goal(s) for the Shift: 1. Maintain hemodynamic stability, MAP>65 2) Maintain respiratory status, ARDSNET protocol Sp02>88% 3) 1/0 goal -500 4)Maintain comfort and safety   Identify possible barriers to advancing the care plan: ETT, ARDS  Stability of the patient: Unstable - high likelihood or risk of patient condition declining or worsening; patient condition declining or worsening    End of Shift Summary:   1) Patient maintained hemodynamic stability. MAP>75mHg with no need for pressors. Patient received 18 mmol of K phos for repletion throughout shift. Patient had NSR with no ectopy noted. Tmax 37.4*c.    2) Patient remains on ARDS NET with ABG sent down q4 with vent changes done by RT. Patient has copious oral secretions suctioned with yankaur and moderate-small secretions of tan/white cloudy consistency noted. CXR performed per MD order. KUB ordered and read.  Patient had thoracentesis done at 1400 with 1.4L amount out. Consent obtained by MD. MD aware of patients +MAC results, however TB PCR neg x2.    3) Patient started onlasix gtt at 551mhr. Patient net -800L for shift.     4) Patient maintained safe, comfortable, and free of harm throughout shift. Patient maintained RASS -2 with propofol gtt at 5081mkg/min, fentanyl gtt at 175m102mr, and precedex at 0.7mcg36m/hr. Patient remains on TPN with BS checked and covered per MD order     Family at bedside throughout shift, updated on plan of care.     Will endorse MICU plan of care to night shift RN

## 2017-03-04 NOTE — Nursing Note
1900 received report.  2000 Assessments done. Teaching is done. Medications given. Lab sent.  2100 lab results reviewed.   2200 labs sent. meds given.   2300 lab results reviewed. Electrolytes ordered.   0000 Assessments done. Teaching is done. Labs sent. Electrolyte replacement started. Medications given.   0100 results reviewed and paged RT and Md. Vent settings adjusted per ARDS NET protocol.  0400 Assessments done. Teaching is done. Medications given. Labs sent.   0500 lab results reviewed, and paged to RT and MD.   0600 medications given.

## 2017-03-04 NOTE — Progress Notes
Pharmaceutical Services ??? Parenteral Nutrition    1/6  - Has received additional KPhos, KCl, and Mag outside of TPN, but will not change any of these electrolytes since these were changed in TPN yesterday and labs reflect only 25% of new TPN.   - Increase CaGlu from 5 mEq to 10 mEq (= 2.5 g) due to down-trending Ca++  - BGs still poorly controlled despite changing novolog PRN. Discussed w/ RD, will decrease volume of TPN from 1992 mL to 1500 mL, which decreases the dextrose content by ~90 g. In addition, increase insulin in TPN from 35 units to 45 units. This change also decreases AA content as well, but going with this for now.  - Patient on propofol 50 mcg/kg/min, no lipids needed    1/5  - Yesterday and this morning, has received about 160 mEq of K+ and phos low this morning. Increase KPhos from 10 mmol to 30 mmol and KCl from 40 mEq to 60 mEq, total K+ increase from 55 to 104 mEq.  - BGs poorly controlled, was on Novolog TID w/ meals PRN, start Novolog SQ every 4 hrs PRN algo #3. Has 35 units of insulin in TPN, prefer to start SQ regimen now.       TPN Medication Recent History (Show up to 2 orders; newest on the left. Changes between the two most recent orders are indicated.)     Start date and time   03/04/2017 2200 03/03/2017 2200      TPN Adult [[id number]] TPN Adult [[id number]]    Order Status  Active Active    Last Given   03/03/2017 2200       Macro Ingredients    amino acids (Travasol)  6 % 6 %    dextrose  18 % 18 %       QS Base    sterile water for inj  82.12 mL 163.97 mL       Electrolytes PER DAY    sodium chloride  70 mEq 70 mEq    sodium acetate  70 mEq 70 mEq    potassium chloride  60 mEq 60 mEq    potassium phosphate  30 mmol 30 mmol    calcium gluconate  10 mEq 5 mEq    magnesium sulfate  20 mEq 20 mEq       Medications/Additives PER DAY    trace elements (MTE-4 Conc)  1 mL 1 mL    selenium  60 mcg 60 mcg    multiple vitamin (adult)  10 mL 10 mL    insulin regular  45 Units 35 Units RX Fee    ADULT TPN FEE (RX USE ONLY)  1,500 mL 1,992 mL       Energy Contribution    Proteins  360 kcal 478 kcal    Dextrose  918.68 kcal 1,218.56 kcal    Lipids  -- --    Total  1,278.68 kcal 1,696.56 kcal       Electrolyte Ion Calculated Amount    Sodium  140 mEq 140 mEq    Potassium  104 mEq 104 mEq    Calcium  10 mEq 5 mEq    Magnesium  20 mEq 20 mEq    Aluminum  -- --    Phosphate  30 mmol 30 mmol    Chloride  166 mEq 177.8 mEq    Acetate  149.2 mEq 175.16 mEq       Other    Total Protein  90  g 119.5 g    Total Protein/kg  0.93 g/kg 1.23 g/kg    Glucose Infusion Rate  1.93 mg/kg/min 2.57 mg/kg/min    Osmolarity  -- --    Volume  1,500 mL 1,992 mL    Rate  62.5 mL/hr 83 mL/hr    Dosing Weight  97 kg (Order-Specific) 97 kg (Order-Specific)    Infusion Site  [facility] C. Corrinne Eagle, PharmD  Inpatient Staff Pharmacist

## 2017-03-04 NOTE — Consults
Nutrition    Called by Herbie Baltimore, PharmD.   Pt receiving TPN. Pt hyperglycemic.   Discussed. Decided to decrease TPN volume from 2L to 1.5L (temporarily) and increase insulin in TPN.      Recommend TF when indicated with Glucerna 1.2. Goal is 19ml/hr.     Gerre Couch, MS, RD pg. [phone]

## 2017-03-04 NOTE — Progress Notes
.  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    -- Lines/Tubes --   ETT (size 8.36mm), CVC 3-lumen L fem, A-line, NGT  -- Drips --  Fent, Precedex, propofol, off pressors  -- Antimicrobials --  Levofloxacin  ???    IE/SUBJECTIVE:   IE:  - Bactrim d/c'd, per ID. Growing mycobacteria in sputum, patient does not have Nocardia.  - Continue levaquin  - Thora of L pleural effusion yest   - Continues on Martin lasix protocol with albumin/lasix gtt. Lasix gtt maxed to rate of 10. Net -1.3L  - P/F ratio improving now 108. Decreased PEEP and RR requirements  - Preliminary liver bx negative for malignancy but sample inadequate    Subjective:   - Intubated and sedated. Unable to assess    OBJECTIVE:   VITALS:  Temp:  [36.4 ???C (97.5 ???F)-37.6 ???C (99.7 ???F)] 37.4 ???C (99.3 ???F)  Heart Rate:  [63-79] 68  Resp:  [10-27] 26  Arterial Line BP (mmHg): (70-123)/(46-113) 112/54  MAP:  [64 mmHg-121 mmHg] 73 mmHg  FiO2 (%):  [60 %-100 %] 60 %  SpO2:  [84 %-99 %] 93 %  01/05 0701 - 01/06 0700  In: 3965 [I.V.:1396]  Out: 4875 [Urine:3175]     Wt Readings from Last 1 Encounters:   02/28/17 (!) 102.2 kg (225 lb 5 oz)     Recent Labs      03/04/17   0500  03/04/17   0002  03/03/17   2000  03/03/17   1700   GLUCOSEPOC  244*  219*  213*  237*   }  Oxygen Therapy  SpO2: 93 %  O2 Device: ETT, Mechanical Ventilator  FiO2 (%): 60 %  O2 Flow Rate (L/min): 40 L/min     Vent Mode: VC  FiO2 (%):  [60 %-100 %] 60 %  Rate:  [26] 26  VT (mL):  [480 mL] 480 mL  PEEP (setting):  [10 cmH2O-14 cmH2O] 10 cmH2O    PHYSICAL EXAM:  Unchanged from yest  General - Intubated/Sedated Neuro - Sedated, +gag, pupils reactive  Cardiovascular - RRR no m/r/g, unable to assess JVP d/t girth, 2+ radial pulses b/l  Lungs - No audible cuff leak, no wheezing anteriorly, mechanical breath sounds  Skin - No visible rashes or lesions  Abdomen - Central obesity, soft, NT/ND  Extremeties - Warm and well-perfused. 1+ LE edema    LABORATORY DATA:   BMP  Recent Labs      03/04/17   0500  03/03/17   2200  03/03/17   1546  03/03/17   0423   NA  139   --   133*  137   K  4.7  3.6  3.8  4.1   CL  99   --   94*  98   CO2  25   --   24  22   BUN  37*   --   38*  32*   CREAT  1.22   --  1.24  1.22   CALCIUM  8.6   --   8.6  8.5*   MG  2.2*  1.9  2.1*  2.2*   PHOS  2.7  1.8*  2.3  1.2*     CBC  Recent Labs      03/04/17   0500  03/03/17   0423  03/02/17   0400   WBC  7.29  8.47  7.38   HGB  9.3*  10.0*  10.1*   HCT  28.8*  30.8*  31.0*   MCV  88.1  87.7  87.1   PLT  149  170  188     Coags  No results for input(s): INR, PT, APTT in the last 72 hours.  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  HIV - neg  Legionella Urinary Ag - neg  Chlamydia/gonorrhea PCR - neg  RPR nonreactive    IMAGING:     No new imaging    ASSESSMENT & PLAN: Ryan Shepard???is a 77 y.o.???male???with a history of COPD (on 3LNC), HFpEF, afib, HTN, DM2, PAD s/p femoral graft, prior prostate CA who was admitted for hypoxemic respiratory failure requiring intubation.    RESP:  #Acute on chronic hypoxemic respiratory failure  Admitted from clinic requiring BiPAP in setting of decreased arterial PaO2 and desats to low 80s. Recently admitted w/ bronch showing chronic colonization with aspergillus. Has features c/f malignancy on imaging. Respiratory status continues to worsen with worsened xray. S/p upsized ETT on 1/1 w/ resolution of cuff leak. Initial c/f PE, CTA read no PE so d/c'd heparin gtt, LE dopplers negative for DVT.   - ARDS NET protocol  - Serial ABGs   - Malignancy work-up, as below  - Antibiotics, as below  ???  #Chronic COPD  Increased sputum production x 2 months, slight wheezing and recently treated with steroids while hospitalized. Current presentation less c/w COPD exacerbation.  - Duonebs q6h  - Inhaled Flovent BID  - Empiric abx, as below  ???  CV:  #Hypovolemic shock in setting of sedation  Patient requires significant sedation d/t air leak on cuff of ETT, patient changing position can increase air leak. Hypotension requiring pressors in setting of sedation but elevated lactate and signs of end-organ damage including rising Cr. Patient has leukocytosis and on empiric abx, no other signs of infection.  - A-line for accurate BP monitoring  - Pressors: off levophed/vasopressin  - Cont hydrocortisone and fludrocortisone (1/2- )  - Trend lactate  - Goal MAPs > 60  on A-line    #HFpEF  Home dose lasix PO 40mg  BID. TTE shows hyperdynamic EF >75% and elevated PASP to  - S/p albumin x 1 on 1/2  - Continue Martin lasix protocol: albumin 25g/25% q8h x 72hr, lasix 20mg  IV x 1, lasix gtt x 72hr (start at 5mg /hr and titrate to loss of 1kg/day)   - Goal net -1-1.5L over 24 hrs  - BID lytes, daily weights, strict I&O's     #Afib  Rate-controlled. CHADS2VaSC 3-4 - Hold home carvedilol 25 mg BID  - Hold home apixaban 5 mg BID    NEURO:  #Intubated and sedated 2/2 hypoxemic respiratory failure  - Cont sedation with fentanyl/precedex/propofol  - Attempt sedation holiday 1/6AM  - TGs WNL    ID:  #Leukocytosis, improving  Afebrile. Increased sputum production x 2 months otherwise no localizing signs of infection. Did receive course of methylprednisolone 12/18-12/23 on recent hospitalization. 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 +Mycobacteria 12/13 BAL resulted on 03/01/17, not nocardia as previously thought.  - ID consulted, appreciate recs  - S/p IV Vanc  - S/p  Azithromycin  - F/u urine histoplasma Ag (sendout lab), hold antifungal tx at this time  - S/p IV Zosyn  - S/p IV Bactrim 500mg  q8hr given that patient does not have pulmonary nocardia  - Cont levofloxacin for atypical coverage/GN coverage (1/2- )  - Start empiric IV cefepime for GP coverage (1/6- )  ???  HEME:  #Hypervascular Liver Lesions/Mediastinal LAD- c/f malignancy, hx prostate cancer treated with total prostatectomy. Current presentation more amenable to IR biopsy of liver lesions, EBUS less given accessibility of lung lesions.  - IR liver biopsy done 1/3, f/u on final path  ???  CHRONIC  #HTN  - Hold home BP meds in setting of shock: nifedipine 30mg  daily, losartan 100 mg daily, carvedilol 25 mg BID  ???  #Chronic???pain   Localized to lower back and lower extremities  - Hold home Norco 5-325 q6h PRN while sedated  ???  #GERD chronic/stable  - Pantoprazole PO 40 mg daily 12/31  ???  #Depression/Anxiety  Mood stable, no SI, no anxiety  - Hold home buspirone 15 mg BID, duloxetine DR 60 while sedated  ???  #PAD  s/p femoral-femoral graft 2009.  - Hole home clopidogrel 75 mg daily  ???  #HLD chronic/stable  - Hold home Pravastatin 40 mg qhs  ???  #DM2 with associated diabetic peripheral neuropathy  - ISS#3 now that TPN started  - Hole home gabapentin 800 mg TID   ??? F - D/c TPN and start TFs via NGT. q6h refeeding labs, supp K/Mg/Phos PRN  A - Sedated  S - Fent/precedex/propofol  T - SQ heparin  H - 30 degrees  U - PPI BID  G - ISS#3, Accuchecks q4h  B - Senna qhs PRN  I - ETT 8.41mm (upsided 1/1), PIV 22g R hand (12/28), CVC 3-Lumen L fem (1/1), A-line (12/31), OGT (12/31), PIV x 1 L hand    Disposition: ICU level care required for hypoxemic respiratory failure requiring intubation  ???  Code Status: Full Code  Contact:  Primary Emergency ContactAdarian, [place], Home Phone: [phone]    Discussed with attending: Eliane Decree., MD, PhD    Jess Barters, PGY-1  [phone]

## 2017-03-04 NOTE — Progress Notes
MICU Attending Note    Pt Name:  Ryan Shepard  MRN: [id number]  Date: 03/03/2017  Time of initial visit: 0955      Requires fully assisted MV support.  Off vasoactives      Temp:  [36.4 ???C (97.5 ???F)-37.6 ???C (99.7 ???F)] 37.4 ???C (99.3 ???F)  Heart Rate:  [63-75] 66  Resp:  [14-26] 25  Arterial Line BP (mmHg): (70-138)/(48-113) 104/52  MAP:  [62 mmHg-121 mmHg] 70 mmHg  FiO2 (%):  [70 %] 70 %  SpO2:  [84 %-95 %] 94 %     Intake/Output Summary (Last 24 hours) at 03/03/17 1708  Last data filed at 03/03/17 1700   Gross per 24 hour   Intake           4682.4 ml   Output             5685 ml   Net          -1002.6 ml       Vent Mode: VC  FiO2 (%):  [70 %] 70 %  Rate:  [18-26] 26  VT (mL):  [480 mL] 480 mL  PEEP (setting):  [12 cmH2O-14 cmH2O] 14 cmH2O     PHYSICAL EXAM  General: Intubated  HEENT: NC/AT, +ETT  Neck: No TM, No LAN  Lungs: Coarse breath sounds  CV: Regular rhythm  Abd: +BS, nontender  Ext: No C/C/E (incl skin color, peripheral pulses, capillary refill)  Neuro: Sedated      Chemistries  Recent Labs      03/03/17   1546  03/03/17   0423  03/02/17   1545  03/02/17   0400   NA  133*  137  140  136   K  3.8  4.1  3.6  3.0*   CL  94*  98  97  94*   CO2  24  22  21  23    BUN  38*  32*  29*  25*   CREAT  1.24  1.22  1.29  1.32*   GLUCOSE  284*  281*  294*  260*   CALCIUM  8.6  8.5*  8.6  8.6   MG  2.1*  2.2*  2.5*  1.8   PHOS  2.3  1.2*   --   3.4     LFTs  Recent Labs      03/03/17   0423   AST  16   ALT  14   ALKPHOS  52   BILITOT  <0.2   BILICON  <0.2   TOTPRO  5.9*   ALBUMIN  2.9*     Hematology  Recent Labs      03/03/17   0423  03/02/17   0400  03/01/17   0409   WBC  8.47  7.38  7.93   HGB  10.0*  10.1*  10.7*   PLT  170  188  186     Coagulation profile  No results for input(s): PT, INR, APTT in the last 72 hours.    Lab Results   Component Value Date    PHART 7.34 (L) 03/03/2017    PHART 7.36 (@) 01/14/2008    PO2ART 98 03/03/2017    PO2ART 77 (@) 01/14/2008    PCO2ART 47 (H) 03/03/2017 PCO2ART 50 (@) 01/14/2008    BICARBART 24.8 03/03/2017    BICARBART 27.6 (@) 01/14/2008    BEART -1 03/03/2017    BEART 2 01/14/2008  CXR - L>R opacity and PVC    A/P -     ???  1) Nocardia pneumonia - antimicrobials as per ID  2) AFB - likely NTM as TB PCR has been negative x2  3) Severe sepsis - due to #1  ?????????- FS 140-180  ?????????- d/c hydrocortisone and fludrocortisone.  May resume if pt becomes hemodynamically unstable.  4) ARDS - due to #1 and #3     - lasix gtt + albumin q8 as per Erling Conte al, CCM 2005  5) Acute hypoxic respiratory failure - due to #1, #2, #4  ?????????- lung protective ventilation  ?????????- HOB 30-45 and oral care  ?????????- SATs and SBTs  6) Left pleural effusion - thoracentesis  7) Prostate ca  8) Mediastinal LAN and???liver masses - f/u liver biopsy results  9) DVT/GI prophylaxis  10) Can resume enteral feeds as now off vasoactive support  11) Physical therapy and mobilization when appropriate    Pt discussed, seen and examined with housestaff.  Please see their notes for further details.  Critical Care Time provided = 90 minutes,  exclusive of procedures.      Eliane Decree MD PhD  Associate Clinical Professor of Medicine  Pulmonary & Critical Care Medicine  [employer] at [facility]

## 2017-03-04 NOTE — Nursing Note
[place] Report from [employer].   Pt's wife is at bedside--updated her with pt's status and plan of care.   Urania.Dibbles MD Intern at bedside--notified her of Temp 37.8, and Lactate 35. No new orders at this time  0830 Labs reviewed    0945 MD Rounds: Add Cefepime ABO. Increase Lasix gtt, goal 10mg /hr. I/O Goal-remove as much fluid as he can tolerate.   Sedation holiday.

## 2017-03-05 ENCOUNTER — Ambulatory Visit: Payer: PRIVATE HEALTH INSURANCE

## 2017-03-05 DIAGNOSIS — C3490 Malignant neoplasm of unspecified part of unspecified bronchus or lung: Secondary | ICD-10-CM

## 2017-03-05 DIAGNOSIS — C787 Secondary malignant neoplasm of liver and intrahepatic bile duct: Secondary | ICD-10-CM

## 2017-03-05 LAB — Glucose,POC
GLUCOSE,POC: 182 mg/dL — ABNORMAL HIGH (ref 65–99)
GLUCOSE,POC: 162 mg/dL — ABNORMAL HIGH (ref 65–99)
GLUCOSE,POC: 157 mg/dL — ABNORMAL HIGH (ref 65–99)
GLUCOSE,POC: 185 mg/dL — ABNORMAL HIGH (ref 65–99)
GLUCOSE,POC: 148 mg/dL — ABNORMAL HIGH (ref 65–99)
GLUCOSE,POC: 174 mg/dL — ABNORMAL HIGH (ref 65–99)
GLUCOSE,POC: 130 mg/dL — ABNORMAL HIGH (ref 65–99)

## 2017-03-05 LAB — CBC: ABSOLUTE NUCLEATED RBC COUNT: 0.02 10*3/uL — ABNORMAL HIGH (ref 0.00–0.00)

## 2017-03-05 LAB — Blood Gases, arterial
ABG INSPIRED O2: 60 mmol/L (ref ?–2)
O2 SATURATION/MEASURED: 89.1 — ABNORMAL LOW (ref >=95.0)
O2 SATURATION/MEASURED: 92.8 — ABNORMAL LOW (ref >=95.0)
PCO2: 42 mmHg (ref 38–42)
PH: 7.4 mmHg (ref 7.37–7.41)
PO2: 62 mmHg — ABNORMAL LOW (ref 98–118)

## 2017-03-05 LAB — Comprehensive Metabolic Panel
ALKALINE PHOSPHATASE: 46 U/L (ref 37–113)
CREATININE: 1.16 mg/dL (ref 0.60–1.30)
UREA NITROGEN: 43 mg/dL — ABNORMAL HIGH (ref 7–22)

## 2017-03-05 LAB — Tissue Exam

## 2017-03-05 LAB — Basic Metabolic Panel
CHLORIDE: 99 mmol/L (ref 96–106)
POTASSIUM: 3.9 mmol/L (ref 3.6–5.3)

## 2017-03-05 LAB — Potassium
POTASSIUM: 3.4 mmol/L — ABNORMAL LOW (ref 3.6–5.3)
POTASSIUM: 3.9 mmol/L (ref 3.6–5.3)

## 2017-03-05 LAB — Bacterial Culture-Gm Stain

## 2017-03-05 LAB — Blood Lactate: BLOOD LACTATE: 44 mg/dL — ABNORMAL HIGH (ref 5–25)

## 2017-03-05 LAB — Calcium,Ionized: IONIZED CA++,CORRECTED: 1.14 mmol/L (ref 1.09–1.29)

## 2017-03-05 LAB — Legionella Culture: LEGIONELLA CULTURE: NEGATIVE

## 2017-03-05 LAB — Magnesium
MAGNESIUM: 1.9 meq/L (ref 1.4–1.9)
MAGNESIUM: 1.8 meq/L (ref 1.4–1.9)
MAGNESIUM: 1.8 meq/L (ref 1.4–1.9)
MAGNESIUM: 1.7 meq/L (ref 1.4–1.9)

## 2017-03-05 LAB — Phosphorus
PHOSPHORUS: 2.5 mg/dL (ref 2.3–4.4)
PHOSPHORUS: 2.7 mg/dL (ref 2.3–4.4)
PHOSPHORUS: 3 mg/dL (ref 2.3–4.4)
PHOSPHORUS: 3.1 mg/dL (ref 2.3–4.4)

## 2017-03-05 LAB — Nocardia Culture

## 2017-03-05 LAB — Anaerobic Culture: ANAEROBIC CULT-GM ST: NEGATIVE

## 2017-03-05 LAB — Bilirubin,Conjugated: BILIRUBIN,CONJUGATED: 0.2 mg/dL (ref <=0.3)

## 2017-03-05 LAB — Differential Automated: ABSOLUTE IMMATURE GRAN COUNT: 0.13 10*3/uL — ABNORMAL HIGH (ref 0.00–0.04)

## 2017-03-05 MED ADMIN — HEPARIN SODIUM (PORCINE) 5000 UNIT/ML IJ SOLN: 5000 [IU] | SUBCUTANEOUS | @ 17:00:00 | Stop: 2017-03-06 | NDC 25021040201

## 2017-03-05 MED ADMIN — INSULIN ASPART 100 UNIT/ML SC SOPN: 3 mL | SUBCUTANEOUS | @ 23:00:00 | Stop: 2017-03-10 | NDC 00169633910

## 2017-03-05 MED ADMIN — INSULIN ASPART 100 UNIT/ML SC SOPN: 3 mL | SUBCUTANEOUS | @ 08:00:00 | Stop: 2017-03-10 | NDC 00169633910

## 2017-03-05 MED ADMIN — INSULIN ASPART 100 UNIT/ML SC SOPN: 3 mL | SUBCUTANEOUS | @ 05:00:00 | Stop: 2017-03-10 | NDC 00169633910

## 2017-03-05 MED ADMIN — INSULIN ASPART 100 UNIT/ML SC SOPN: 3 mL | SUBCUTANEOUS | @ 12:00:00 | Stop: 2017-03-10 | NDC 00169633910

## 2017-03-05 MED ADMIN — LEVOFLOXACIN 750 MG/150 ML RTU: 750 mg | INTRAVENOUS | @ 21:00:00 | Stop: 2017-03-08 | NDC 00143972024

## 2017-03-05 MED ADMIN — IPRATROPIUM BROMIDE 0.02 % IN SOLN: 500 ug | RESPIRATORY_TRACT | @ 02:00:00 | Stop: 2017-03-07 | NDC 00487980101

## 2017-03-05 MED ADMIN — PROPOFOL INFUSION 10 MG/ML: 7755 ug/min | INTRAVENOUS | @ 10:00:00 | Stop: 2017-03-14 | NDC 63323026965

## 2017-03-05 MED ADMIN — INSULIN ASPART 100 UNIT/ML SC SOPN: 3 mL | SUBCUTANEOUS | @ 20:00:00 | Stop: 2017-03-10 | NDC 00169633910

## 2017-03-05 MED ADMIN — PANTOPRAZOLE SODIUM 40 MG IV SOLR: 40 mg | INTRAVENOUS | @ 08:00:00 | Stop: 2017-03-13 | NDC 55150020210

## 2017-03-05 MED ADMIN — POTASSIUM CHLORIDE 20 MEQ/50ML RTU: 20 meq | INTRAVENOUS | @ 16:00:00 | Stop: 2017-03-05 | NDC 00338070341

## 2017-03-05 MED ADMIN — CHLORHEXIDINE GLUCONATE 0.12 % MT SOLN: 10 mL | OROMUCOSAL | @ 17:00:00 | Stop: 2017-03-13 | NDC 52376002110

## 2017-03-05 MED ADMIN — PROPOFOL INFUSION 10 MG/ML: 7755 ug/min | INTRAVENOUS | @ 15:00:00 | Stop: 2017-03-14 | NDC 63323026965

## 2017-03-05 MED ADMIN — IPRATROPIUM BROMIDE 0.02 % IN SOLN: 500 ug | RESPIRATORY_TRACT | @ 07:00:00 | Stop: 2017-03-07 | NDC 00487980101

## 2017-03-05 MED ADMIN — PROPOFOL INFUSION 10 MG/ML: 7755 ug/min | INTRAVENOUS | @ 19:00:00 | Stop: 2017-03-14 | NDC 63323026965

## 2017-03-05 MED ADMIN — DEXMEDETOMIDINE 4 MCG/ML DRIP: 20.68 ug/h | INTRAVENOUS | @ 10:00:00 | Stop: 2017-03-06 | NDC 55150020902

## 2017-03-05 MED ADMIN — ALBUTEROL SULFATE (5 MG/ML) 0.5% IN NEBU: 2.5 mg | RESPIRATORY_TRACT | @ 21:00:00 | Stop: 2017-03-07 | NDC 00487990130

## 2017-03-05 MED ADMIN — FLUDROCORTISONE ACETATE 0.1 MG PO TABS: .05 mg | ORAL | @ 17:00:00 | Stop: 2017-03-05 | NDC 68084028801

## 2017-03-05 MED ADMIN — ALBUTEROL SULFATE (5 MG/ML) 0.5% IN NEBU: 2.5 mg | RESPIRATORY_TRACT | @ 14:00:00 | Stop: 2017-03-07 | NDC 00487990130

## 2017-03-05 MED ADMIN — ALBUTEROL SULFATE (5 MG/ML) 0.5% IN NEBU: 2.5 mg | RESPIRATORY_TRACT | @ 07:00:00 | Stop: 2017-03-07 | NDC 00487990130

## 2017-03-05 MED ADMIN — PROPOFOL INFUSION 10 MG/ML: 7755 ug/min | INTRAVENOUS | @ 11:00:00 | Stop: 2017-03-14 | NDC 63323026965

## 2017-03-05 MED ADMIN — INSULIN ASPART 100 UNIT/ML SC SOPN: 3 mL | SUBCUTANEOUS | @ 16:00:00 | Stop: 2017-03-10 | NDC 00169633910

## 2017-03-05 MED ADMIN — CEFEPIME 1 GM/50 ML RTU: 1 g | INTRAVENOUS | @ 16:00:00 | Stop: 2017-03-08 | NDC 00338130141

## 2017-03-05 MED ADMIN — INSULIN ASPART 100 UNIT/ML SC SOPN: 3 mL | SUBCUTANEOUS | Stop: 2017-03-10 | NDC 00169633910

## 2017-03-05 MED ADMIN — POLYETHYLENE GLYCOL 3350 17 G PO PACK: 17 g | ORAL | @ 17:00:00 | Stop: 2017-03-06 | NDC 68084043098

## 2017-03-05 MED ADMIN — SENNOSIDES 8.6 MG PO TABS: 1 | ORAL | @ 05:00:00 | Stop: 2017-03-06 | NDC 00904652261

## 2017-03-05 MED ADMIN — ALBUMIN HUMAN 25 % IV SOLN: 25 g | INTRAVENOUS | Stop: 2017-03-06 | NDC 68516521601

## 2017-03-05 MED ADMIN — HYDROCORTISONE NA SUCCINATE PF 100 MG IJ SOLR: 50 mg | INTRAVENOUS | @ 02:00:00 | Stop: 2017-03-05 | NDC 00009001104

## 2017-03-05 MED ADMIN — PROPOFOL INFUSION 10 MG/ML: 7755 ug/min | INTRAVENOUS | @ 23:00:00 | Stop: 2017-03-14 | NDC 63323026965

## 2017-03-05 MED ADMIN — IPRATROPIUM BROMIDE 0.02 % IN SOLN: 500 ug | RESPIRATORY_TRACT | @ 21:00:00 | Stop: 2017-03-07 | NDC 00487980101

## 2017-03-05 MED ADMIN — THIAMINE HCL 100 MG PO TABS: 100 mg | ORAL | @ 17:00:00 | Stop: 2017-03-13

## 2017-03-05 MED ADMIN — CEFEPIME 1 GM/50 ML RTU: 1 g | INTRAVENOUS | @ 23:00:00 | Stop: 2017-03-08 | NDC 00338130141

## 2017-03-05 MED ADMIN — CHLORHEXIDINE GLUCONATE 0.12 % MT SOLN: 10 mL | OROMUCOSAL | @ 05:00:00 | Stop: 2017-03-13 | NDC 52376002110

## 2017-03-05 MED ADMIN — HYDROCORTISONE NA SUCCINATE PF 100 MG IJ SOLR: 50 mg | INTRAVENOUS | @ 14:00:00 | Stop: 2017-03-05 | NDC 00009001104

## 2017-03-05 MED ADMIN — FENTANYL 2.5 MG/100 ML NS DRIP: 50 ug/h | INTRAVENOUS | @ 12:00:00 | Stop: 2017-03-13 | NDC 00409909461

## 2017-03-05 MED ADMIN — ALBUMIN HUMAN 25 % IV SOLN: 25 g | INTRAVENOUS | @ 16:00:00 | Stop: 2017-03-06 | NDC 68516521601

## 2017-03-05 MED ADMIN — CEFEPIME 1 GM/50 ML RTU: 1 g | INTRAVENOUS | @ 08:00:00 | Stop: 2017-03-08 | NDC 00338130141

## 2017-03-05 MED ADMIN — ALBUTEROL SULFATE (5 MG/ML) 0.5% IN NEBU: 2.5 mg | RESPIRATORY_TRACT | @ 02:00:00 | Stop: 2017-03-07 | NDC 00487990130

## 2017-03-05 MED ADMIN — ALBUMIN HUMAN 25 % IV SOLN: 25 g | INTRAVENOUS | @ 08:00:00 | Stop: 2017-03-06 | NDC 68516521601

## 2017-03-05 MED ADMIN — POTASSIUM CHLORIDE 20 MEQ/50ML RTU: 20 meq | INTRAVENOUS | @ 06:00:00 | Stop: 2017-03-05 | NDC 00338070341

## 2017-03-05 MED ADMIN — IPRATROPIUM BROMIDE 0.02 % IN SOLN: 500 ug | RESPIRATORY_TRACT | @ 14:00:00 | Stop: 2017-03-07 | NDC 00487980101

## 2017-03-05 MED ADMIN — PROPOFOL INFUSION 10 MG/ML: 7755 ug/min | INTRAVENOUS | @ 02:00:00 | Stop: 2017-03-14 | NDC 63323026965

## 2017-03-05 MED ADMIN — PROPOFOL INFUSION 10 MG/ML: 7755 ug/min | INTRAVENOUS | @ 23:00:00 | Stop: 2017-03-14

## 2017-03-05 MED ADMIN — HEPARIN SODIUM (PORCINE) 5000 UNIT/ML IJ SOLN: 5000 [IU] | SUBCUTANEOUS | @ 05:00:00 | Stop: 2017-03-06 | NDC 25021040201

## 2017-03-05 MED ADMIN — FENTANYL 2.5 MG/100 ML NS DRIP: 50 ug/h | INTRAVENOUS | @ 02:00:00 | Stop: 2017-03-13

## 2017-03-05 MED ADMIN — CEFEPIME 1 GM/50 ML RTU: 1 g | INTRAVENOUS | @ 01:00:00 | Stop: 2017-03-08 | NDC 00338130141

## 2017-03-05 MED ADMIN — POTASSIUM CHLORIDE 20 MEQ/50ML RTU: 20 meq | INTRAVENOUS | @ 14:00:00 | Stop: 2017-03-05 | NDC 00338070341

## 2017-03-05 MED ADMIN — POTASSIUM CHLORIDE 20 MEQ/50ML RTU: 20 meq | INTRAVENOUS | @ 08:00:00 | Stop: 2017-03-05 | NDC 00338070341

## 2017-03-05 MED ADMIN — HYDROCORTISONE NA SUCCINATE PF 100 MG IJ SOLR: 50 mg | INTRAVENOUS | @ 08:00:00 | Stop: 2017-03-05 | NDC 00009001104

## 2017-03-05 MED ADMIN — FUROSEMIDE 10 MG/ML DRIP (STRAIGHT DRUG): 30 mg/h | INTRAVENOUS | @ 10:00:00 | Stop: 2017-03-09 | NDC 36000028425

## 2017-03-05 NOTE — Consults
Patient remains intubated on the vent on ARDs Net protocol and gtts. Pathology from liver bx. pending.

## 2017-03-05 NOTE — Progress Notes
INFECTIOUS DISEASES PROGRESS NOTE    Patient: Ryan Shepard  MRN: [id number]  DOB: 1941-02-16  Date of Service: 03/05/2017  Requesting Physician: Eliane Decree., MD, PhD  Reason for Consultation: R/o Aspergillus Pneumonia    Chief Complaint: Hypoxia    History of Present Illness:   Mr Dantonio is a 77 y/o M with COPD, tobacco use disorder, PAD s/p femoral graft, HTN, DM2, HLD, Afib, GERD, depression, anxiety, chronic pain readmitted on 12/28 following discharge to home on 12/26 with recurrence of dyspnea.     Recent hospital course as follows:  12/6: Admitted with a COPD exacerbation in the setting of diagnosed COPD and extensive tobacco use disorder. CXR on admission showing confluent airspace opacities c/f multifocal aspiration/pneumonia. Procal neg. Treated with levaquin (12/6- ) x 7 days and 5 day course of prednisone.   12/11: Patient was planned for discharge but then was found on bacterial resp culture from admission (12/6) to have aspergillus species, few probably candida. Other micro including legionella urine ag neg, MRSA nares neg, HIV neg. Had CT chest done showing airspace and nodular consolidation within the left upper lobe and lingula and lesser within the right upper and right lower lobes consistent with pneumonia, likely fungal. Also with bulky intrathoracic multicompartamental lymphadenopathy. 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 Started on acyclovir 800 mg BID x 5 days. Gonorrhea/chlamydia neg. RPR pending. Bacterial culture no growth to date. HIV neg.   12/12: VSS, no other acute changes. Requiring 3 L NC. No changes in respiratory status.   12/13: VSS. No acute changes. Pulmonology consulted, recommended bronch,  performed same day. Pt states his SOB has improved drastically. No fevers, chills, nausea, vomiting. No new culture data. 12/14: Events noted; the patient became increasingly hypoxic after the bronchoscopy despite using a 15 liter face mask. The blood also noted hypercapnia.  According to the notes the patient reported worsening cough associated blood tinged sputum, but today he claims there was no change in his breath or cough. He was transferred to ICU for further monitoring and started on voriconazole, piperacillin-tazobactam and vancomycin. He denied fever, chills chest pain or changes in his breathing.  12/15: Remains AF with normal WBC. Reports he is coughing more today but breathing feels better.   12/16: Transferred to the floor, AF  12/23: Completed 14 day course of antibiotic treatment (Vanco/Zosyn/Vori x 3 days; Ceftriaxone/Flagyl x 6 days; Vanco/Zosyn x 5 days)  12/26: Discharged home  12/28: Seen in Medicine clinic for routine follow-up, noted to have O2Sat in low 70's on 4L NC, readmitted to hospital with BiPap treatment, Vanco/Zosyn/Azithromycin restarted    Hospital Course (Key Events): Date of Admission 03/12/2017   12/28: Admitted  12/31: On Bipap this AM, continued productive cough. Intubated.  1/1: BAL w/ thick mucous, therapeutic suctioning  1/2: AF, intubated, FiO2 80%, PEEP 10, levo at 0.18. WBC 11 from 13s, lactate still elevated 28. Pt sedated and non-responsive. Transitioned to ARDSnet protocol. Started levofloxacin. Started hydrocort and fludrocort.  1/3: Tmax 38.3 yesterday, FiO2 improved to 60%, PEEP still 10, WBC 7 from 10, Cr 1.2 from 1.4, levophed off and on vaso 2.4. Intubated, sedated. 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 Head CT wo abscess.  1/4: Tmax 38, levo at 0.02, 70% FiO2, PEEP 10 on vent. Pt intubated and sedated on vent. Discussed pt w/ wife at bedside.  1/5: AF, pressors off, FiO2 still 70%, PEEP up to 12. Intubated/sedated on vent. Planning to prone this afternoon per RN.    1/7: Tmax 37.8 yesterday, FiO2 60%, PEEP 10. Intubated/sedated. Pertinent antimicrobial courses:  Levofloxacin 1/2-  Cefepime 1/6-    Bactrim IV 1/3-5  Pip-tazo 12/28-1/4  Azithromycin 12/28-1/2  Vancomycin 12/28-30    Review of Systems:  Unable to perform as pt intubated and sedated    Past Medical History:  Past Medical History:   Diagnosis Date   ??? Cancer (HCC/RAF)    ??? Diabetes (HCC/RAF)    ??? GERD (gastroesophageal reflux disease)    ??? Hypercholesteremia    ??? Hypertension    ??? PAD (peripheral artery disease) (HCC/RAF)    ??? 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 Prostate disease    ??? Scoliosis     adolesent   ??? Vascular disease       Allergies:   No Known Allergies    Medications:  Scheduled Meds:  ??? albumin  25 g Intravenous Q8H   ??? albuterol  2.5 mg Nebulization Q6H   ??? cefepime IV  1 g Intravenous Q8H   ??? chlorhexidine  10 mL Mouth/Throat BID   ??? fludrocortisone  0.05 mg Oral Daily   ??? heparin  5,000 Units Subcutaneous BID   ??? hydrocortisone IV  50 mg Intravenous Q6H   ??? ipratropium  0.5 mg Nebulization Q6H   ??? levoFLOXacin  750 mg Intravenous Q24H   ??? pantoprazole  40 mg IV Push Q24H   ??? polyethylene glycol  17 g Oral Daily   ??? potassium chloride IV (central)  20 mEq Intravenous Q2H   ??? senna  1 tablet Oral QHS   ??? thiamine  100 mg Oral Daily     Continuous Infusions:  ??? dexmedetomidine 4 mcg/mL drip 0.7 mcg/kg/hr (03/05/17 0211)   ??? fentaNYL 2.5 mg/100 mL drip 150 mcg/hr (03/05/17 0419)   ??? furosemide drip 10 mg/mL 10 mg/hr (03/05/17 0144)   ??? norepinephrine drip Stopped (03/03/17 0400)   ??? propofol drip 40 mcg/kg/min (03/05/17 0724)   ??? sodium chloride 5 mL/hr (02/26/17 0041)   ??? sodium chloride 5 mL/hr (02/27/17 1610)     PRN Meds:.acetaminophen, insulin aspart **AND** dextrose, oxyCODONE      Physical Exam:  Temp:  [37.3 ???C (99.1 ???F)-37.8 ???C (100 ???F)] 37.7 ???C (99.9 ???F)  Heart Rate:  [69-126] 72  Resp:  [12-28] 26  Arterial Line BP (mmHg): (90-148)/(53-79) 131/61  MAP:  [68 mmHg-106 mmHg] 84 mmHg  FiO2 (%):  [50 %-60 %] 60 % SpO2:  [89 %-95 %] 91 %  Temp (24hrs), Avg:37.6 ???C (99.7 ???F), Min:37.3 ???C (99.1 ???F), Max:37.8 ???C (100 ???F)    Intake and Output:   Last Two Completed Shifts:  I/O last 2 completed shifts:  In: 2372.9 [I.V.:1334.9; NG/GT:280; IV Piggyback:400]  Out: 4820 [Urine:4770; Emesis/GI output:50]  Vitals:    03/05/17 0557   Weight: (!) 104.7 kg (230 lb 13.2 oz)   Height:      Gen: Older AA man, intubated, sedated  HEENT: Sclerae anicteric, ETT in place  Neck: Soft, supple without lymphadenopathy.  CV: Regular rate, normal S1/S2. No murmurs appreciated.   Chest: CTAB anteriorly w/ vent sounds  Abdomen: Soft, non-distended, non-tender, normoactive bowel sounds  Musculoskeletal: No clubbing, cyanosis, or edema.   Neuro: Sedated    Labs:  Lab Results   Component Value Date  WBC 7.18 03/05/2017    HGB 9.6 (L) 03/05/2017    HCT 29.7 (L) 03/05/2017    MCV 91.4 03/05/2017    PLT 156 03/05/2017     Lab Results   Component Value Date    CREAT 1.16 03/05/2017    BUN 43 (H) 03/05/2017    NA 143 03/05/2017    K 3.8 03/05/2017    CL 99 03/05/2017    CO2 26 03/05/2017     Lab Results   Component Value Date    ALT 12 03/05/2017    AST 16 03/05/2017    ALKPHOS 46 03/05/2017    BILITOT 0.2 03/05/2017     CrAg neg  Ur histo neg  Cocci Ag EIA neg  Aspergillus EIA neg    Microbiology:   1/5 L pleural fluid cx NGTD (exudative fluid)  1/3 Resp cx Probable C.albicans  1/2 BCx x2 NGTD  1/2 UA neg  12/31 BAL cx - neg or NGTD  12/28 Resp Cx Probable C.albicans  12/28 Blood Cx NTD  12/28 RVP neg  12/18 Resp cx Neg (C albicans)  12/13 BAL Nocardia cx: Rare beaded Gram positive rods --> fast-growing mycobacterium  12/13 AFB cx Mycobacterium species  12/13 BAL cx Neg  12/13 Respiratory culture: Candida, otherwise negative so far; AFB smear negative x 2  12/13 Legionella culture neg  Aspergillus Ag EIA -  <0.50  RVP panel negative  Aspergillus fumigatus (M3) IgE - NEG   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+ Bacterial culture gram stain from vesicle - Coag neg staph like colonies, few enteroccocus like colonies  Cryptococcal Ag - neg  Cocci EIA/Ag - Negative  12/6 SputumCandida albicans and Aspergillus [place]  MRSA screen negative  ???  HIV - neg  Legionella Urinary Ag - neg  Chlamydia/gonorrhea PCR - neg  RPR nonreactive    Imaging Reviewed by Me:   1/5 CXR:  ET, NG tube terminating within the gastric antrum.  Unchanged bilateral mediastinal and bilateral hilar fullness, consistent with reactive lymphadenopathy.  Unchanged patchy and nodular airspace consolidation involving the central left lung, consistent with atypical pneumonia.  Decreased small left pleural effusion. No pneumothorax postthoracentesis.    1/3 CTH w con: No acute intracranial hemorrhage or mass effect. No abscess is seen.    1/3 US liver bx: Technically successful ultrasound-guided core needle biopsy of segment 8 hypodense lesion.     12/20 CT Chest:  1.   Redemonstration of coalescent airspace and nodular consolidation, most prominent in the left upper lobe and lingula with peripheral groundglass associated with Unchanged to slightly more prominent mediastinal and bilateral hilar lymphadenopathy.   This finding is nonspecific but concerning for malignancy versus pneumonia with etiologies including fungal infection.   2.  Increase in size of pleural and pericardial effusions and anasarca consistent with volume overload.  3.   Dilated main pulmonary artery measuring 39 mm reflecting pulmonary arterial hypertension.  4.  Severe coronary artery calcifications.    12/20 CT A/P:  History of prostate cancer treated with total prostatectomy with at least 4 hypervascular hepatic masses measuring up to 5 cm in the right hepatic lobe, suspicious for metastatic disease. No abdominal or pelvic lymphadenopathy.  Severe atherosclerotic disease with thrombosed left common, external, and femoral artery, and patent femorofemoral bypass.    Pathology: 1/5 L pleural fluid cytology pending    1/3 Liver bx pending      Assessment:   77yo M w/ h/o COPD, prostate CA with possible metastatic  disease, recent admission for dyspnea treated with 14 days antibiotics, now readmitted with hypoxia but pt comfortable on exam. ID consulted for further w/u and management.    Pt initially w/ severe hypoxia on supplemental O2, diffuse nodular opacities in lungs, and continued production of purulent sputum, requiring intubation and ARDSnet protocol. Recurrent hypoxia likely secondary to a persistent or recurrent bacterial infection secondary to obstructive pulmonary process (COPD +/- lung metastases). Treated empirically for recurrent PNA. S/p 5 days of azithromycin and several days of empiric pip-tazo. Started on levofloxacin 1/2. Nocardia cx from BAL on 12/13 turned positive on 1/3, and pt started on bactrim, but further evaluation it is mycobactrium so Bactrim stopped.     Although concern for possible aspergillus pneumonia is reasonable in this patient with persistent lung disease and imaging consistent with fungal infection, only A [place] (not a common cause of pulmonary disease) has been isolated on a single expectorated sputum sample, with no growth on multiple other respiratory cultures, including a BAL culture, and negative galactomannan assay (while on Zosyn) so we are considering this colonization/contamination.     Problem List:  - Mycobacterial growth on resp cx from BAL 12/13, speci pending, unclear if true pathogen yet  - COPD, exacerbation, with acute hypoxic respiratory failure and bacterial infection  - Prostate cancer with possible metastatic disease (liver and LN, s/p  Biopsy 03/01/16 results pending)  - Bacterial pneumonia B/L (unspecified)  - Shock, septic and hypovolemic    - Precautions: No infectious isolation      Recommendations:   - Can send Cocci EIA to round out infectious w/u of respiratory failure - Recommend discontinuing levofloxacin as no indication for double gram negative coverage in the setting of negative cultures  - Recommend continuing empiric cefepime for 10-14 day course for pneumonia in setting of severe COPD w/ respiratory failure, course to be 12/28 - 1/10 (14 days).    Thank you for this consultation. We will sign-off now. Please page [phone] (General ID) with any questions.  DWA Dr. Manson Passey  Recommendations relayed to primary ICU team.    Author:  Jeanne Ivan, MD  Infectious Disease Fellow, PGY-4  03/05/2017  8:15 AM

## 2017-03-05 NOTE — Progress Notes
MICU Attending Note    Pt Name:  Ryan Shepard  MRN: [id number]  Date: 03/04/2017  Time of initial visit: 0940      Remains intubated.      Temp:  [37.1 ???C (98.8 ???F)-37.8 ???C (100 ???F)] 37.6 ???C (99.7 ???F)  Heart Rate:  [66-83] 82  Resp:  [10-27] 26  Arterial Line BP (mmHg): (87-123)/(46-82) 119/58  MAP:  [64 mmHg-86 mmHg] 79 mmHg  FiO2 (%):  [50 %-100 %] 50 %  SpO2:  [91 %-99 %] 92 %     Intake/Output Summary (Last 24 hours) at 03/04/17 1717  Last data filed at 03/04/17 1700   Gross per 24 hour   Intake          3688.39 ml   Output             4610 ml   Net          -921.61 ml       Vent Mode: VC  FiO2 (%):  [50 %-100 %] 50 %  Rate:  [26] 26  VT (mL):  [480 mL] 480 mL  PEEP (setting):  [10 cmH2O-12 cmH2O] 10 cmH2O     PHYSICAL EXAM  General: Intubated  HEENT: NC/AT, +ETT  Neck: No TM, No LAN  Lungs: Coarse bilateral breath sounds  CV: Regular rhythm  Abd: +BS, nontender  Ext: No C/C/E.  1+ pulses, good capillary refill, normal skin coloration  Neuro: Sedated      Chemistries  Recent Labs      03/04/17   1607  03/04/17   1202  03/04/17   0500   03/03/17   1546   NA  142   --   139   --   133*   K  3.9  4.2  4.7   < >  3.8   CL  99   --   99   --   94*   CO2  26   --   25   --   24   BUN  40*   --   37*   --   38*   CREAT  1.13   --   1.22   --   1.24   GLUCOSE  189*   --   247*   --   284*   CALCIUM  8.9   --   8.6   --   8.6   MG  1.9  2.1*  2.2*   < >  2.1*   PHOS  2.5  2.6  2.7   < >  2.3    < > = values in this interval not displayed.     LFTs  Recent Labs      03/04/17   0500  03/03/17   0423   AST  10*  16   ALT  11  14   ALKPHOS  43  52   BILITOT  <0.2  <0.2   BILICON  <0.2  <0.2   TOTPRO  5.8*  5.9*   ALBUMIN  3.2*  2.9*     Hematology  Recent Labs      03/04/17   0500  03/03/17   0423  03/02/17   0400   WBC  7.29  8.47  7.38   HGB  9.3*  10.0*  10.1*   PLT  149  170  188     Coagulation profile  No results for input(s): PT, INR, APTT  in the last 72 hours.    Lab Results   Component Value Date PHART 7.40 03/04/2017    PHART 7.36 (@) 01/14/2008    PO2ART 60 (L) 03/04/2017    PO2ART 77 (@) 01/14/2008    PCO2ART 47 (H) 03/04/2017    PCO2ART 50 (@) 01/14/2008    BICARBART 28.4 (H) 03/04/2017    BICARBART 27.6 (@) 01/14/2008    BEART 4 (H) 03/04/2017    BEART 2 01/14/2008       CXR - L>R consolidatoins    A/P -     ???  1) L>R???pneumonia - empiric antibiotic coverage  2) Severe sepsis - due to #1     - FS 140-180  3) ARDS - due to #1 and #2     - lasix gtt + albumin q8  4) Acute hypoxic respiratory failure - due to #1, #2, #3  ?????????- lung protective ventilation  ?????????- HOB 30-45 and oral care  ?????????- SATs and SBTs  5) Exudative left effusion - f/u cytology and cultures  6) Prostate ca  7) Mediastinal LAN and???liver masses - awaiting liver biopsy results  8) DVT/GI prophylaxis  9) Nutrition  10) Physical therapy and mobilization when appropriate    Pt discussed, seen and examined with housestaff.  Please see their notes for further details.  Critical Care Time provided = 45 minutes,  exclusive of procedures.      Eliane Decree MD PhD  Associate Clinical Professor of Medicine  Pulmonary & Critical Care Medicine  [employer] at [facility]

## 2017-03-05 NOTE — Nursing Note
0820: On initial assessment and with oral care, pt began moving head slowly side to side. New loud cuff leak noted with decrease in tidal vol. Notified MD and RT. RT came to [facility] and increased cuff pressure to 50. Increased propofol drip to 72mcg/kg/m.  0930:noted aflutter with rate in 60s per monitor. MD notified and EKG ordered but pt converted to SR before done. VO from MD to perform 12lead EKG if aflutter returns.    1200: PO2=56, 02 sat87%. Pt suctioned and RT notified. Repositioned supine. Pt also noted to be less responsive, propofol drip decreased to 41mcg/kg/m. Notified MD of k=3.9 and mag=1.7 with increased UO.   1700: TF changed to Peptamen 1.5. KCl 29mEq PerOGT and mag 2g IV given. Add'l dose miralax given.

## 2017-03-05 NOTE — Other
Patients Clinical Goal:   Clinical Goal(s) for the Shift: Maintain hemodynamics. MAP >65. Oxygen saturation goal ARDS Net. I/O goal negative. Maintain comfort and safety with restraints.   Identify possible barriers to advancing the care plan:   Stability of the patient: Moderately Unstable - medium risk of patient condition declining or worsening    End of Shift Summary:   VSS. MAP within goal.   Continue ARDS Net. Oxygen saturation is within ARDS net goal.   Pt desaturates when turned.   Continue lasix gtt. Maintain comfort and safety with restraints.

## 2017-03-05 NOTE — Other
Patients Clinical Goal:   Clinical Goal(s) for the Shift: 1. Monitor hemodynamic stability, 2. Monitor respiratory status, 3. Maintain comfrot and safety  Identify possible barriers to advancing the care plan: ARDS  Stability of the patient: Moderately Unstable - medium risk of patient condition declining or worsening    End of Shift Summary:   1. Patient was sedated, HR went up to 120's when pt waking up. Propofol titrated up to 60 then down to 40 mcg/kg/min. RASS:+1 to -2.  2. VC FiO2:50%, RT adjusted to 60% for ABG pO2:56. SpO2:88-95.  3. Assisted patient as needed, comfort and safety maintained.

## 2017-03-05 NOTE — Consults
NUTRITION ASSESSMENT (Adult)    Admit Date: 02/22/2017     Date of Birth: 12-10-1940 Gender: male MRN: [id number]     Date of Assessment: 03/05/2017   Status: Reassessment   Indication: Respiratory failure on ventilator   Subjective: Discussed with RN and MD. Pt is on trickle feeds only due to high aspiration risk secondary to cuff leak, but MD is OK with increasing TF. Pt with soft abdomen per RN.    Problems: Active Problems:    Dyspnea POA: Yes    Acute hypoxemic respiratory failure (HCC/RAF) POA: Yes       Past Medical History:   Diagnosis Date   ??? Cancer (HCC/RAF)    ??? Diabetes (HCC/RAF)    ??? GERD (gastroesophageal reflux disease)    ??? Hypercholesteremia    ??? Hypertension    ??? PAD (peripheral artery disease) (HCC/RAF)    ??? 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 Prostate disease    ??? Scoliosis     adolesent   ??? Vascular disease     Past Surgical History:   Procedure Laterality Date   ??? BACK SURGERY     ??? BILATERAL FEMORAL ARTERY EXPLORATION  2009   ??? BLADDER SURGERY  1999   ??? PROSTATE SURGERY  2000           Data   Intake/Outputs: I/O last 2 completed shifts:  In: 2397.9 [I.V.:1334.9; Other:5; NG/GT:300; IV Piggyback:400]  Out: 4920 [Urine:4870; Emesis/GI output:50]  Last BM 12/29    Pertinent Medications:  Scheduled Meds:  ??? albumin  25 g Intravenous Q8H   ??? albuterol  2.5 mg Nebulization Q6H   ??? cefepime IV  1 g Intravenous Q8H   ??? chlorhexidine  10 mL Mouth/Throat BID   ??? heparin  5,000 Units Subcutaneous BID   ??? ipratropium  0.5 mg Nebulization Q6H   ??? levoFLOXacin  750 mg Intravenous Q24H   ??? pantoprazole  40 mg IV Push Q24H   ??? [START ON 03/06/2017] polyethylene glycol  34 g Oral Daily   ??? senna  2 tablet Oral QHS   ??? thiamine  100 mg Oral Daily     Continuous Infusions:  ??? dexmedetomidine 4 mcg/mL drip 0.7 mcg/kg/hr (03/05/17 0700)   ??? fentaNYL 2.5 mg/100 mL drip 150 mcg/hr (03/05/17 0700)   ??? furosemide drip 10 mg/mL 10 mg/hr (03/05/17 0700) ??? norepinephrine drip Stopped (03/03/17 0400)   ??? propofol drip 45 mcg/kg/min (03/05/17 1500)   ??? sodium chloride 5 mL/hr (02/26/17 0041)   ??? sodium chloride 5 mL/hr (02/27/17 1610)     PRN Meds:.acetaminophen, insulin aspart **AND** dextrose    FDI Target Drugs: No      Pertinent Labs:   Na 143  K 3.9  Phos 3.1   BUN/creat 43/1.16    TG: 83 (1/5)    Vit B1 pending.     Accu-Chek:   Recent Labs      03/04/17   2102  03/04/17   2348  03/05/17   0338  03/05/17   0810  03/05/17   1145  03/05/17   1522   GLUCOSEPOC  162*  157*  185*  148*  174*  130*       Respiratory Status / O2 Device: ETT, Mechanical Ventilator    Pressure Injury: none    Additional data:    77yo male admitted with resp failure  Hx: COPD (on 4LNC), HFpEF, afib, HTN, DM2, PAD  s/p femoral graft, prior prostate CA    12/31 - intubated  1/2 - concern for malignancy per MD     Diet Info   ??? Allergies:   Patient has no known allergies.  ??? Cultural/Ethnic/Religious/Other Food Preferences:  Unable to Assess     ??? Nutrition prior to admit:  Unable to assess.  ??? Current diet order:     Diets/Supplements/Feeds   Diet    Diet NPO Except for: Medications     Start Date/Time: 02/28/17 1400     Number of Occurrences:  Until Specified   Nourishments    Diet tube feeding non bolus Orogastric (OG); Glucerna 1.2 Initiate full strength at 10 ml/hr goal, advance by 57ml/hr every 8hrs, . Do not increase rate     Start Date/Time: 03/05/17 1600     Number of Occurrences:  Until Specified     Order Comments: Initiate full strength at 10 ml/hr goal, advance by 43ml/hr every 8hrs, . Do not increase rate       ??? PO % consumed: NPO  ??? Parenteral Nutrition: none  ??? Enteral Nutrition: Glucerna 1.2 at 8ml/hr.   ??? Other caloric sources: receiving variable amounts of propofol; provides 1.1cal/ml    Anthropometrics:    Height: 185.4 cm (6' 0.99'')  Admit Weight: 97.1 kg (214 lb) (2017-03-21 1134) Last 5 recorded weights:  Weights 02/26/2017 02/27/2017 02/27/2017 02/28/2017 03/05/2017 Weight 99.4 kg 100.9 kg 100.9 kg 102.2 kg 104.7 kg     Adjusted Weight (kg): 89      IBW: 83.5 kg (184 lb)  % Ideal Body Weight: 125 %  BMI (Calculated): 30.5    Usual Weight:  (n/a-patient intubated)           Estimated Needs   Using admit wt: 97kg  2400-2900cal (25-30cal/kg)  125-145gm pro (1.3-1.5gm/kg)     Diet Education   No diet education needs at this time            Malnutrition Assessment   Inflammation: Mild-Moderate/Chronic   Energy Intake: Unable to assess   Weight Loss: > 5% in 1 month    Nutrition-Focused Physical Exam: 02/28/2017  Not performed: Deferred to next visit.     Nutrition-Focused Physical Exam: 03/05/2017  Orbital: Moderate, Upper Arm: Moderate, Thoracic/Lumbar: Unable to assess  Subcutaneous Fat Loss: Moderate    Temple: Severe, Clavicle: Severe, Deltoid: Severe  Scapula: Unable to assess, Interosseous: None, Anterior Thigh: Severe, Patellar Region: Severe, Posterior Calf: Severe  Muscle Loss: Severe    Patient meets criteria for: Severe Malnutrition    Nutrition Assessment   1. Nutritional Status-      Reviewed old records for weight history.      Loss of ~16kg over ~ 38yrs (gradual)       Loss of ~ 6kg (6%) in less than one month pta noted.       Admit wt remains > ideal (114%).      Fluctuated wt since admission.      NFPE shows severe muscle loss and moderate fat loss.        2. Nutrition-       NPO.      On trickle TF only, inadequate.       Was on TPN 1/3-1/6.      Receiving propfol (provides 1.1cal/ml). Tg wnl.    3. Gastrointestinal-      OGT to suction. 50ml out.       Last stool 12/29-12/30.      On bowel  regimen.    4. Glucose Control-       Hx DM. No A1c. Variable glucose control. Hyperglycemia while on TPN, improved today. Meds include ssi.     5. Micronutrients-      Vit B1 pending.       On 100mg  vit B1 daily.      Recommendations / Care Plan      1. Recommend Peptamen 1.5, start at 80ml/hr, advance 12ml/hr q 8 hrs as tolerated to goal rate 67ml/hr. Prov: 2520cal (26cal/kg)                114gm pro (1.2gm/kg)  2. Spoke with MD to increase bowel regimen.  3. Obtain daily wt.   4. Paged MD with recs.      Author:  York Ram, MS, RD, CNSC   Covering for Valene Bors, MS, RD pg. [phone]  03/05/2017 4:42 PM

## 2017-03-06 ENCOUNTER — Ambulatory Visit: Payer: PRIVATE HEALTH INSURANCE

## 2017-03-06 ENCOUNTER — Ambulatory Visit: Payer: MEDICARE

## 2017-03-06 ENCOUNTER — Ambulatory Visit (HOSPITAL_COMMUNITY)
Admission: RE | Admit: 2017-03-06 | Discharge: 2017-03-06 | Disposition: A | Discharge status: Home / Self Care | Payer: PPO | Source: Ambulatory Visit | Attending: Family Medicine | Admitting: Family Medicine

## 2017-03-06 DIAGNOSIS — J432 Centrilobular emphysema: Secondary | ICD-10-CM | POA: Diagnosis not present

## 2017-03-06 DIAGNOSIS — J984 Other disorders of lung: Secondary | ICD-10-CM | POA: Insufficient documentation

## 2017-03-06 DIAGNOSIS — Z122 Encounter for screening for malignant neoplasm of respiratory organs: Secondary | ICD-10-CM | POA: Insufficient documentation

## 2017-03-06 DIAGNOSIS — I2510 Atherosclerotic heart disease of native coronary artery without angina pectoris: Secondary | ICD-10-CM | POA: Insufficient documentation

## 2017-03-06 DIAGNOSIS — R911 Solitary pulmonary nodule: Secondary | ICD-10-CM | POA: Insufficient documentation

## 2017-03-06 DIAGNOSIS — I700 Atherosclerosis of aorta: Secondary | ICD-10-CM | POA: Diagnosis not present

## 2017-03-06 DIAGNOSIS — F17210 Nicotine dependence, cigarettes, uncomplicated: Secondary | ICD-10-CM | POA: Diagnosis not present

## 2017-03-06 LAB — Phosphorus
PHOSPHORUS: 3 mg/dL (ref 2.3–4.4)
PHOSPHORUS: 3.1 mg/dL (ref 2.3–4.4)
PHOSPHORUS: 3.5 mg/dL (ref 2.3–4.4)
PHOSPHORUS: 4.4 mg/dL (ref 2.3–4.4)

## 2017-03-06 LAB — Blood Gases, arterial
BASE EXCESS: 2 mmol/L (ref ?–2)
BICARBONATE: 25.7 mmol/L (ref 22.0–26.0)
O2 SATURATION/MEASURED: 85.7 — ABNORMAL LOW (ref >=95.0)
PH: 7.37 mmHg (ref 7.37–7.41)
PH: 7.39 mmHg (ref 7.37–7.41)
PO2: 82 mmHg — ABNORMAL LOW (ref 98–118)

## 2017-03-06 LAB — Hepatic Funct Panel: TOTAL PROTEIN: 6.1 g/dL (ref 6.1–8.2)

## 2017-03-06 LAB — Uric Acid
URIC ACID: 4.2 mg/dL (ref 3.4–8.8)
URIC ACID: 3.8 mg/dL (ref 3.4–8.8)
URIC ACID: 4.2 mg/dL (ref 3.4–8.8)

## 2017-03-06 LAB — Lactate Dehydrogenase
LACTATE DEHYDROGENASE: 387 U/L — ABNORMAL HIGH (ref 125–256)
LACTATE DEHYDROGENASE: 302 U/L — ABNORMAL HIGH (ref 125–256)
LACTATE DEHYDROGENASE: 331 U/L — ABNORMAL HIGH (ref 125–256)

## 2017-03-06 LAB — Flow Cytometry

## 2017-03-06 LAB — Blood Lactate: BLOOD LACTATE: 43 mg/dL — ABNORMAL HIGH (ref 5–25)

## 2017-03-06 LAB — Differential Automated: ABSOLUTE BASO COUNT: 0.07 10*3/uL (ref 0.00–0.10)

## 2017-03-06 LAB — Comprehensive Metabolic Panel: CHLORIDE: 100 mmol/L (ref 96–106)

## 2017-03-06 LAB — Urea Nitrogen,Random Urine: UREA NITROGEN,RANDOM URINE: 243 mg/dL

## 2017-03-06 LAB — Basic Metabolic Panel
CALCIUM: 8.9 mg/dL (ref 8.6–10.4)
GFR ESTIMATE FOR NON-AFRICAN AMERICAN: 51 mg/dL (ref 65–99)
GFR ESTIMATE FOR NON-AFRICAN AMERICAN: 55 mmol/L (ref 135–146)
TOTAL CO2: 25 mmol/L (ref 20–30)

## 2017-03-06 LAB — Glucose,POC
GLUCOSE,POC: 108 mg/dL — ABNORMAL HIGH (ref 65–99)
GLUCOSE,POC: 197 mg/dL — ABNORMAL HIGH (ref 65–99)
GLUCOSE,POC: 254 mg/dL — ABNORMAL HIGH (ref 65–99)
GLUCOSE,POC: 171 mg/dL — ABNORMAL HIGH (ref 65–99)
GLUCOSE,POC: 134 mg/dL — ABNORMAL HIGH (ref 65–99)

## 2017-03-06 LAB — Magnesium
MAGNESIUM: 1.7 meq/L (ref 1.4–1.9)
MAGNESIUM: 2.1 meq/L — ABNORMAL HIGH (ref 1.4–1.9)

## 2017-03-06 LAB — Calcium: CALCIUM: 9 mg/dL (ref 8.6–10.4)

## 2017-03-06 LAB — CBC: MCH CONCENTRATION: 31.9 g/dL (ref 31.5–35.5)

## 2017-03-06 LAB — Acid-Fast Culture and Stain, Respiratory

## 2017-03-06 LAB — Fungal Culture Respiratory

## 2017-03-06 LAB — Vitamin B1 (Thiamine),Wh Blood: VITAMIN B1 (THIAMINE),WH BLOOD: 89 nmol/L (ref 70–180)

## 2017-03-06 LAB — Bacterial Culture-Gm Stain: BACTERIAL CULTURE-GM STAIN: NEGATIVE

## 2017-03-06 LAB — Blood Culture Detection
BLOOD CULTURE FINAL STATUS: NEGATIVE
BLOOD CULTURE FINAL STATUS: NEGATIVE

## 2017-03-06 LAB — Cocci IgG/IgM EIA: COCCIDIOIDES IGM EIA: <0.15 (ref <0.150)

## 2017-03-06 LAB — Fine Needle Aspiration

## 2017-03-06 LAB — Creatinine,Random Urine: CREATININE,RANDOM URINE: 21.7 mg/dL

## 2017-03-06 LAB — Cytology, Respiratory

## 2017-03-06 LAB — Sodium,Random,Ur: SODIUM,RANDOM URINE: 88 mmol/L

## 2017-03-06 MED ADMIN — INSULIN ASPART 100 UNIT/ML SC SOPN: 3 mL | SUBCUTANEOUS | @ 20:00:00 | Stop: 2017-03-10 | NDC 00169633910

## 2017-03-06 MED ADMIN — CARBOPLATIN CHEMO INFUSION (AUC): 296.6 mg | INTRAVENOUS | @ 08:00:00 | Stop: 2017-03-06 | NDC 00703424401

## 2017-03-06 MED ADMIN — THIAMINE HCL 100 MG PO TABS: 100 mg | ORAL | @ 17:00:00 | Stop: 2017-03-13

## 2017-03-06 MED ADMIN — DEXMEDETOMIDINE 4 MCG/ML DRIP: 20.68 ug/h | INTRAVENOUS | @ 07:00:00 | Stop: 2017-03-11

## 2017-03-06 MED ADMIN — IPRATROPIUM BROMIDE 0.02 % IN SOLN: 500 ug | RESPIRATORY_TRACT | @ 14:00:00 | Stop: 2017-03-07 | NDC 00487980101

## 2017-03-06 MED ADMIN — DEXAMETHASONE SODIUM PHOSPHATE 20 MG/5ML IJ SOLN: 10 mg | INTRAVENOUS | @ 06:00:00 | Stop: 2017-03-06 | NDC 67457042254

## 2017-03-06 MED ADMIN — MAGNESIUM SULFATE 2 GM/50ML IV SOLN: 2 g | INTRAVENOUS | @ 02:00:00 | Stop: 2017-03-06 | NDC 63323010605

## 2017-03-06 MED ADMIN — INSULIN ASPART 100 UNIT/ML SC SOPN: 3 mL | SUBCUTANEOUS | @ 12:00:00 | Stop: 2017-03-10 | NDC 00169633910

## 2017-03-06 MED ADMIN — ALBUMIN HUMAN 25 % IV SOLN: 25 g | INTRAVENOUS | @ 17:00:00 | Stop: 2017-03-06 | NDC 68516521601

## 2017-03-06 MED ADMIN — IPRATROPIUM BROMIDE 0.02 % IN SOLN: 500 ug | RESPIRATORY_TRACT | @ 19:00:00 | Stop: 2017-03-07 | NDC 00487980101

## 2017-03-06 MED ADMIN — ALBUMIN HUMAN 25 % IV SOLN: 25 g | INTRAVENOUS | @ 07:00:00 | Stop: 2017-03-06 | NDC 68516521601

## 2017-03-06 MED ADMIN — CHLORHEXIDINE GLUCONATE 0.12 % MT SOLN: 10 mL | OROMUCOSAL | @ 04:00:00 | Stop: 2017-03-13 | NDC 52376002110

## 2017-03-06 MED ADMIN — METOLAZONE 2.5 MG PO TABS: 2.5 mg | OROGASTRIC | @ 11:00:00 | Stop: 2017-03-06 | NDC 00185505001

## 2017-03-06 MED ADMIN — ONDANSETRON HCL 4 MG/2ML IJ SOLN: 8 mg | INTRAVENOUS | @ 17:00:00 | Stop: 2017-03-09 | NDC 00641607825

## 2017-03-06 MED ADMIN — ETOPOSIDE CHEMO INFUSION: 172 mg | INTRAVENOUS | @ 07:00:00 | Stop: 2017-03-08 | NDC 16729011431

## 2017-03-06 MED ADMIN — POLYETHYLENE GLYCOL 3350 17 G PO PACK: 34 g | ORAL | @ 17:00:00 | Stop: 2017-03-07 | NDC 68084043098

## 2017-03-06 MED ADMIN — IPRATROPIUM BROMIDE 0.02 % IN SOLN: 500 ug | RESPIRATORY_TRACT | @ 02:00:00 | Stop: 2017-03-07 | NDC 00487980101

## 2017-03-06 MED ADMIN — PROPOFOL INFUSION 10 MG/ML: 7755 ug/min | INTRAVENOUS | @ 18:00:00 | Stop: 2017-03-14 | NDC 63323026965

## 2017-03-06 MED ADMIN — PROPOFOL INFUSION 10 MG/ML: 7755 ug/min | INTRAVENOUS | @ 14:00:00 | Stop: 2017-03-14 | NDC 63323026965

## 2017-03-06 MED ADMIN — SENNOSIDES 8.6 MG PO TABS: 2 | ORAL | @ 04:00:00 | Stop: 2017-03-07 | NDC 00904652261

## 2017-03-06 MED ADMIN — CEFEPIME 1 GM/50 ML RTU: 1 g | INTRAVENOUS | @ 17:00:00 | Stop: 2017-03-08 | NDC 00338130141

## 2017-03-06 MED ADMIN — POLYETHYLENE GLYCOL 3350 17 G PO PACK: 17 g | ORAL | Stop: 2017-03-06 | NDC 68084043098

## 2017-03-06 MED ADMIN — FUROSEMIDE 10 MG/ML DRIP (STRAIGHT DRUG): 30 mg/h | INTRAVENOUS | @ 07:00:00 | Stop: 2017-03-09 | NDC 36000028425

## 2017-03-06 MED ADMIN — CHLORHEXIDINE GLUCONATE 0.12 % MT SOLN: 10 mL | OROMUCOSAL | @ 17:00:00 | Stop: 2017-03-13 | NDC 52376002110

## 2017-03-06 MED ADMIN — ALBUTEROL SULFATE (5 MG/ML) 0.5% IN NEBU: 2.5 mg | RESPIRATORY_TRACT | @ 14:00:00 | Stop: 2017-03-07 | NDC 00487990130

## 2017-03-06 MED ADMIN — ALLOPURINOL 300 MG PO TABS: 300 mg | ORAL | @ 05:00:00 | Stop: 2017-03-10 | NDC 51079020620

## 2017-03-06 MED ADMIN — INSULIN ASPART 100 UNIT/ML SC SOPN: 3 mL | SUBCUTANEOUS | @ 16:00:00 | Stop: 2017-03-10 | NDC 00169633910

## 2017-03-06 MED ADMIN — POTASSIUM CHLORIDE 20 MEQ/15ML (10%) PO SOLN: 60 meq | ORAL | @ 01:00:00 | Stop: 2017-03-06 | NDC 66689004850

## 2017-03-06 MED ADMIN — LEVOFLOXACIN 750 MG/150 ML RTU: 750 mg | INTRAVENOUS | @ 21:00:00 | Stop: 2017-03-08 | NDC 00143972024

## 2017-03-06 MED ADMIN — IPRATROPIUM BROMIDE 0.02 % IN SOLN: 500 ug | RESPIRATORY_TRACT | @ 08:00:00 | Stop: 2017-03-07 | NDC 00487980101

## 2017-03-06 MED ADMIN — CEFEPIME 1 GM/50 ML RTU: 1 g | INTRAVENOUS | @ 08:00:00 | Stop: 2017-03-08 | NDC 00338130141

## 2017-03-06 MED ADMIN — ALBUMIN HUMAN 25 % IV SOLN: 25 g | INTRAVENOUS | Stop: 2017-03-06 | NDC 68516521601

## 2017-03-06 MED ADMIN — PROPOFOL INFUSION 10 MG/ML: 7755 ug/min | INTRAVENOUS | @ 06:00:00 | Stop: 2017-03-14 | NDC 63323026965

## 2017-03-06 MED ADMIN — FENTANYL 2.5 MG/100 ML NS DRIP: 50 ug/h | INTRAVENOUS | @ 09:00:00 | Stop: 2017-03-13 | NDC 00409909461

## 2017-03-06 MED ADMIN — PROPOFOL INFUSION 10 MG/ML: 7755 ug/min | INTRAVENOUS | @ 23:00:00 | Stop: 2017-03-14 | NDC 63323026965

## 2017-03-06 MED ADMIN — LACTATED RINGERS IV SOLN: 75 mL/h | INTRAVENOUS | @ 21:00:00 | Stop: 2017-03-06 | NDC 00338011704

## 2017-03-06 MED ADMIN — ALBUTEROL SULFATE (5 MG/ML) 0.5% IN NEBU: 2.5 mg | RESPIRATORY_TRACT | @ 02:00:00 | Stop: 2017-03-07 | NDC 00487990130

## 2017-03-06 MED ADMIN — HEPARIN SODIUM (PORCINE) 5000 UNIT/ML IJ SOLN: 5000 [IU] | SUBCUTANEOUS | @ 17:00:00 | Stop: 2017-03-06 | NDC 25021040201

## 2017-03-06 MED ADMIN — FUROSEMIDE 10 MG/ML DRIP (STRAIGHT DRUG): 30 mg/h | INTRAVENOUS | @ 17:00:00 | Stop: 2017-03-09

## 2017-03-06 MED ADMIN — ALBUTEROL SULFATE (5 MG/ML) 0.5% IN NEBU: 2.5 mg | RESPIRATORY_TRACT | @ 08:00:00 | Stop: 2017-03-07 | NDC 00487990130

## 2017-03-06 MED ADMIN — ONDANSETRON HCL 4 MG/2ML IJ SOLN: 8 mg | INTRAVENOUS | @ 06:00:00 | Stop: 2017-03-09 | NDC 00641607825

## 2017-03-06 MED ADMIN — INSULIN ASPART 100 UNIT/ML SC SOPN: 3 mL | SUBCUTANEOUS | @ 08:00:00 | Stop: 2017-03-10 | NDC 00169633910

## 2017-03-06 MED ADMIN — LACTATED RINGERS IV SOLN: 75 mL/h | INTRAVENOUS | @ 07:00:00 | Stop: 2017-03-06 | NDC 00338011704

## 2017-03-06 MED ADMIN — DEXMEDETOMIDINE 4 MCG/ML DRIP: 20.68 ug/h | INTRAVENOUS | @ 16:00:00 | Stop: 2017-03-11 | NDC 55150020902

## 2017-03-06 MED ADMIN — ALBUTEROL SULFATE (5 MG/ML) 0.5% IN NEBU: 2.5 mg | RESPIRATORY_TRACT | @ 19:00:00 | Stop: 2017-03-07 | NDC 00487990130

## 2017-03-06 MED ADMIN — DEXMEDETOMIDINE 4 MCG/ML DRIP: 20.68 ug/h | INTRAVENOUS | @ 01:00:00 | Stop: 2017-03-06 | NDC 55150020902

## 2017-03-06 MED ADMIN — FUROSEMIDE 10 MG/ML DRIP (STRAIGHT DRUG): 30 mg/h | INTRAVENOUS | @ 21:00:00 | Stop: 2017-03-09

## 2017-03-06 MED ADMIN — ALLOPURINOL 300 MG PO TABS: 300 mg | ORAL | @ 17:00:00 | Stop: 2017-03-10 | NDC 51079020620

## 2017-03-06 MED ADMIN — PANTOPRAZOLE SODIUM 40 MG IV SOLR: 40 mg | INTRAVENOUS | @ 07:00:00 | Stop: 2017-03-13 | NDC 55150020210

## 2017-03-06 MED ADMIN — HEPARIN SODIUM (PORCINE) 5000 UNIT/ML IJ SOLN: 5000 [IU] | SUBCUTANEOUS | @ 04:00:00 | Stop: 2017-03-06 | NDC 25021040201

## 2017-03-06 NOTE — Nursing Note
1230 Patient saturation went down to 83-84%. RT notified.  RT increased PEEP to 12, Fio2 100%. BP MAP < 65. Head of bed flat.  Team notified.    1231 Team at bedside with dr. Cherly Hensen. New orders noted.    1240 Chest X-ray completed.    1315 Lasix gtt increased to 20mg /hr. ABG sent to the lab. Patient's MAP now 69. Levophed gtt still on standby.

## 2017-03-06 NOTE — Progress Notes
MICU Attending Note    Pt Name:  Ryan Shepard  MRN: [id number]  Date: 03/05/2017  Time of initial visit: 0940      Still requiring MV support.  +Cuff leak when patient moves      Temp:  [37.3 ???C (99.1 ???F)-37.9 ???C (100.2 ???F)] 37.9 ???C (100.2 ???F)  Heart Rate:  [69-126] 75  Resp:  [19-28] 26  Arterial Line BP (mmHg): (88-148)/(46-79) 112/54  MAP:  [60 mmHg-106 mmHg] 71 mmHg  FiO2 (%):  [50 %-70 %] 70 %  SpO2:  [88 %-95 %] 93 %     Intake/Output Summary (Last 24 hours) at 03/05/17 1726  Last data filed at 03/05/17 1600   Gross per 24 hour   Intake          2282.84 ml   Output             4845 ml   Net         -2562.16 ml       Vent Mode: VC  FiO2 (%):  [50 %-70 %] 70 %  Rate:  [26] 26  VT (mL):  [480 mL] 480 mL  PEEP (setting):  [10 cmH2O] 10 cmH2O     PHYSICAL EXAM  General: Intubated  HEENT: NC/AT, +ETT  Neck: No TM, No LAN  Lungs: Coarse breath sounds bilaterally  CV: Regular rhythm  Abd: +BS, NT/ND  Ext: No C/C/E  Neuro: Sedated      Chemistries  Recent Labs      03/05/17   1522  03/05/17   1003  03/05/17   0340   03/04/17   1607   NA  145   --   143   --   142   K  3.3*  3.9  3.8   < >  3.9   CL  100   --   99   --   99   CO2  25   --   26   --   26   BUN  43*   --   43*   --   40*   CREAT  1.23   --   1.16   --   1.13   GLUCOSE  136*   --   191*   --   189*   CALCIUM  9.0   --   9.2   --   8.9   MG  1.7  1.7  1.8   < >  1.9   PHOS  3.1  3.1  3.0   < >  2.5    < > = values in this interval not displayed.     LFTs  Recent Labs      03/05/17   0340  03/04/17   0500  03/03/17   0423   AST  16  10*  16   ALT  12  11  14    ALKPHOS  46  43  52   BILITOT  0.2  <0.2  <0.2   BILICON  0.2  <0.2  <0.2   TOTPRO  6.1  5.8*  5.9*   ALBUMIN  3.8*  3.2*  2.9*     Hematology  Recent Labs      03/05/17   0340  03/04/17   0500  03/03/17   0423   WBC  7.18  7.29  8.47   HGB  9.6*  9.3*  10.0*   PLT  156  149  170     Coagulation profile  No results for input(s): PT, INR, APTT in the last 72 hours.    Lab Results Component Value Date    PHART 7.41 03/05/2017    PHART 7.36 (@) 01/14/2008    PO2ART 76 (L) 03/05/2017    PO2ART 77 (@) 01/14/2008    PCO2ART 42 03/05/2017    PCO2ART 50 (@) 01/14/2008    BICARBART 26.1 (H) 03/05/2017    BICARBART 27.6 (@) 01/14/2008    BEART 2 03/05/2017    BEART 2 01/14/2008         A/P -     1) L>R???pneumonia - empiric antibiotic coverage  2) Severe sepsis???- due to #1     - FS 140-180  3) ARDS - due to #1 and #2  ?????????- lasix gtt + albumin q8  4) Acute hypoxic respiratory failure - due to #1, #2, #3  ?????????- lung protective ventilation  ?????????- HOB 30-45 and oral care  ?????????- SATs and SBTs  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  Please see their notes for further details.  Critical Care Time provided = 50 minutes,  exclusive of procedures.      Eliane Decree MD PhD  Associate Clinical Professor of Medicine  Pulmonary & Critical Care Medicine  [employer] at [facility]

## 2017-03-06 NOTE — Progress Notes
.  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    -- Lines/Tubes --   ETT (size 8.80mm), CVC 3-lumen L fem, A-line, NGT  -- Drips --  Fent, Precedex, propofol, off pressors  -- Antimicrobials --  Levofloxacin  ???    IE/SUBJECTIVE:   IE:  - 2 x trach cuff leak ON and this AM, inflated cuff  - Unable to do full awakening trial yest, dyssynchronous with vent when waned of sedation  - TPN stopped, now on TFs  - Continue levaquin  - Thora studies c/w exudate, cytolog pending  - Continues on Martin lasix protocol with albumin/lasix gtt. Lasix gtt maxed to rate of 10. Net -1.3L  - P/F ratio improving now 120, but increased FiO2 requirements.  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 Unable to assess    OBJECTIVE:   VITALS:  Temp:  [37.3 ???C (99.1 ???F)-38.1 ???C (100.6 ???F)] 38.1 ???C (100.6 ???F)  Heart Rate:  [67-126] 75  Resp:  [19-26] 26  Arterial Line BP (mmHg): (88-148)/(46-79) 111/51  MAP:  [60 mmHg-106 mmHg] 70 mmHg  FiO2 (%):  [50 %-70 %] 60 %  SpO2:  [88 %-95 %] 90 %  01/06 0701 - 01/07 0700  In: 2397.9 [I.V.:1334.9]  Out: 4920 [Urine:4870]     Wt Readings from Last 1 Encounters:   03/05/17 (!) 104.7 kg (230 lb 13.2 oz)     Recent Labs      03/05/17   1958  03/05/17   1522  03/05/17   1145  03/05/17   0810   GLUCOSEPOC  108*  130*  174*  148*   }  Oxygen Therapy  SpO2: 90 %  O2 Device: ETT, Mechanical Ventilator  FiO2 (%): 60 %  O2 Flow Rate (L/min): 40 L/min     Vent Mode: VC  FiO2 (%):  [50 %-70 %] 60 %  Rate:  [26] 26  VT (mL):  [480 mL] 480 mL  PEEP (setting):  [10 cmH2O] 10 cmH2O    PHYSICAL EXAM: Unchanged from yest  General - Intubated/Sedated  Neuro - Sedated, +gag, pupils reactive  Cardiovascular - RRR no m/r/g, unable to assess JVP d/t girth, 2+ radial pulses b/l  Lungs - No audible cuff leak, no wheezing anteriorly, mechanical breath sounds  Skin - No visible rashes or lesions  Abdomen - Central obesity, soft, NT/ND  Extremeties - Warm and well-perfused. 1+ LE edema    LABORATORY DATA:   BMP  Recent Labs      03/05/17   1522  03/05/17   1003  03/05/17   0340   03/04/17   1607   NA  145 ~ 145   --   143   --   142   K  3.3* ~ 3.3*  3.9  3.8   < >  3.9   CL  100 ~ 100   --   99   --  99   CO2  25 ~ 25   --   26   --   26   BUN  43* ~ 43*   --   43*   --   40*   CREAT  1.23 ~ 1.23   --   1.16   --   1.13   CALCIUM  9.0 ~ 9.0   --   9.0 ~ 9.2   --   8.9   MG  1.7  1.7  1.8   < >  1.9   PHOS  3.1  3.1  3.0 ~ 3.0   < >  2.5    < > = values in this interval not displayed.     CBC  Recent Labs      03/05/17   0340  03/04/17   0500  03/03/17   0423   WBC  7.18  7.29  8.47   HGB  9.6*  9.3*  10.0*   HCT  29.7*  28.8*  30.8*   MCV  91.4  88.1  87.7   PLT  156  149  170     Coags  No results for input(s): INR, PT, APTT in the last 72 hours.  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  HIV - neg  Legionella Urinary Ag - neg  Chlamydia/gonorrhea PCR - neg  RPR nonreactive    IMAGING:     No new imaging    ASSESSMENT & PLAN:   Ryan Shepard???is a 77 y.o.???male???with a history of COPD (on 3LNC), HFpEF, afib, HTN, DM2, PAD s/p femoral graft, prior prostate [place] who was admitted for hypoxemic respiratory failure requiring intubation.    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 CT head negative for brain mets.  - Heme-onc consulted, appreciate recs.   - Family meeting with wife (DPOA), daughter on phone, primary ICU team and Heme-Onc on 1/7 PM. Discussed benefits/adverse effects of chemotherapy and possibility that this may help decr tumor burden in lung as possible bridge to extubation. Family agreed to initiation.  - Chemo (caroplatin/etoposide) initiated 1/7 PM  - Allopurinol 300mg  BID  - G6PD testing in case rasburicase is needed  - TLS labs q8 (BMP, Phos, uric acid, LDH)  - Switch SQ heparin to LMWH for DVT prophylaxis    RESP:  #Acute on chronic hypoxemic  respiratory failure  Admitted from clinic requiring BiPAP in setting of decreased arterial PaO2 and desats to low 80s. Recently admitted w/ bronch showing chronic colonization with aspergillus. Has features c/f malignancy on imaging. Respiratory status continues to worsen with worsened xray. S/p upsized ETT on 1/1 w/ resolution of cuff leak. Initial c/f PE, CTA read no PE so d/c'd heparin gtt, LE dopplers negative for DVT.   - ARDS NET protocol  - Serial ABGs   - Malignancy tx, as below  - Antibiotics, as below    #Chronic COPD  Increased sputum production x 2 months, slight wheezing and recently treated with steroids while hospitalized. Current presentation less c/w COPD exacerbation.  - Duonebs q6h  - Inhaled Flovent BID  - Empiric abx, as below  ??? CV:  #Hypovolemic shock in setting of sedation, improving  Patient requires significant sedation d/t air leak on cuff of ETT, patient changing position can increase air leak. Hypotension requiring pressors in setting of sedation but elevated lactate and signs of end-organ damage including rising Cr. Patient has leukocytosis and on empiric abx, no other signs of infection.  - A-line for accurate BP monitoring  - Pressors: off levophed/vasopressin  - D/c hydrocortisone and fludrocortisone (1/2-1/7)  - Trend lactate  - Goal MAPs > 65 on A-line    #HFpEF  Home dose lasix PO 40mg  BID. TTE shows hyperdynamic EF >75% and elevated PASP to .  - Continue Martin lasix protocol: albumin 25g/25% q8h x 72hr, lasix 20mg  IV x 1, lasix gtt x 72hr (start at 5mg /hr and titrate to loss of 1kg/day)   - Goal net -1-1.5L over 24 hrs  - BID lytes, daily weights, strict I&O's     #Afib  Rate-controlled. CHADS2VaSC 3-4  - Hold home carvedilol 25 mg BID  - Hold home apixaban 5 mg BID    NEURO:  #Intubated and sedated 2/2 hypoxemic respiratory failure  - Cont sedation with fentanyl/precedex/propofol  - Attempt sedation holiday as tolerated  - TGs WNL    ID:  #Leukocytosis, improving  Afebrile. Increased sputum production x 2 months otherwise no localizing signs of infection. Did receive course of methylprednisolone 12/18-12/23 on recent hospitalization. 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 +Mycobacteria 12/13 BAL resulted on 03/01/17, not nocardia as previously thought.  - ID consulted, appreciate recs. Now signed off  - S/p IV Vanc  - S/p  Azithromycin  - F/u urine histoplasma Ag (sendout lab), hold antifungal tx at this time  - S/p IV Zosyn  - S/p IV Bactrim 500mg  q8hr given that patient does not have pulmonary nocardia  - Cont levofloxacin for atypical coverage/GN coverage (1/2- )  - Cont empiric IV cefepime for GP coverage (1/6- )  ???  ???  CHRONIC  #HTN - Hold home BP meds in setting of shock: nifedipine 30mg  daily, losartan 100 mg daily, carvedilol 25 mg BID  ???  #Chronic???pain   Localized to lower back and lower extremities  - Hold home Norco 5-325 q6h PRN while sedated  ???  #GERD chronic/stable  - Pantoprazole PO 40 mg daily 12/31  ???  #Depression/Anxiety  Mood stable, no SI, no anxiety  - Hold home buspirone 15 mg BID, duloxetine DR 60 while sedated  ???  #PAD  s/p femoral-femoral graft 2009.  - Hole home clopidogrel 75 mg daily  ???  #HLD chronic/stable  - Hold home Pravastatin 40 mg qhs  ???  #DM2 with associated diabetic peripheral neuropathy  - ISS#3 now that  TPN started  - Hole home gabapentin 800 mg TID   ???  F - TFs via NGT. q6h refeeding labs, supp K/Mg/Phos PRN  A - Sedated  S - Fent/precedex/propofol  T - Switch to SQ lovenox  H - 30 degrees  U - PPI BID  G - ISS#3, Accuchecks q4h  B - Senna qhs PRN  I - ETT 8.53mm (upsided 1/1), PIV 22g R hand (12/28), CVC 3-Lumen L fem (1/1), A-line (12/31), OGT (12/31), PIV x 1 L hand    Disposition: ICU level care required for hypoxemic respiratory failure requiring intubation  ???  Code Status: Full Code  Contact:  Primary Emergency ContactTirrell, [place], Home Phone: [phone]    Discussed with attending: Eliane Decree., MD, PhD    Jess Barters, PGY-1  [phone]      Pt seen, evaluated and examined with housestaff.  Please refer to my note for my impression and plan.    Eliane Decree MD PhD  Associate Clinical Professor of Medicine  Pulmonary & Critical Care Medicine  [employer] at [facility]

## 2017-03-06 NOTE — Progress Notes
.  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    -- Lines/Tubes --   ETT (size 8.69mm), CVC 3-lumen L fem, A-line, NGT  -- Drips --  Fent, Precedex, propofol, off pressors  -- Antimicrobials --  Levofloxacin, cefepime  ???    IE/SUBJECTIVE:   IE:  - Liver bx c/w small cell CA (likely lung primary)  - Heme-Onc consulted, family meeting yesterday. Chemo started  - Continues on Martin lasix protocol with albumin/lasix gtt. Lasix gtt maxed to rate of 10. Net -800L  - P/F ratio improving now 120, but increased FiO2 requirements.      Subjective:   - Intubated and sedated. Unable to assess    OBJECTIVE:   VITALS:  Temp:  [37.2 ???C (99 ???F)-38.2 ???C (100.8 ???F)] 37.2 ???C (99 ???F)  Heart Rate:  [67-121] 74  Resp:  [19-26] 26  Arterial Line BP (mmHg): (88-140)/(46-64) 109/52  MAP:  [60 mmHg-89 mmHg] 70 mmHg  FiO2 (%):  [60 %-70 %] 70 %  SpO2:  [85 %-94 %] 91 %  01/07 0701 - 01/08 0700  In: 3651.9 [I.V.:1857.9]  Out: 4355 [Urine:4355]     Wt Readings from Last 1 Encounters:   03/06/17 (!) 103.8 kg (228 lb 13.4 oz)     Recent Labs      03/06/17   0403  03/05/17   2356  03/05/17   1958  03/05/17   1522   GLUCOSEPOC  254*  197*  108*  130*   }  Oxygen Therapy  SpO2: 91 %  O2 Device: ETT, Mechanical Ventilator  FiO2 (%): 70 %  O2 Flow Rate (L/min): 40 L/min     Vent Mode: VC  FiO2 (%):  [60 %-70 %] 70 %  Rate:  [26] 26  VT (mL):  [480 mL] 480 mL  PEEP (setting):  [10 cmH2O] 10 cmH2O    PHYSICAL EXAM:  Unchanged from yest  General - Intubated/Sedated  Neuro - Sedated, +gag, pupils reactive Cardiovascular - RRR no m/r/g, unable to assess JVP d/t girth, 2+ radial pulses b/l  Lungs - No audible cuff leak, no wheezing anteriorly, mechanical breath sounds  Skin - No visible rashes or lesions  Abdomen - Central obesity, soft, NT/ND  Extremeties - Warm and well-perfused. 1+ LE edema    LABORATORY DATA:   BMP  Recent Labs      03/06/17   0358  03/05/17   1522  03/05/17   1003  03/05/17   0340   NA  144  145 ~ 145   --   143   K  4.0  3.3* ~ 3.3*  3.9  3.8   CL  101  100 ~ 100   --   99   CO2  25  25 ~ 25   --   26   BUN  49*  43* ~ 43*   --   43*   CREAT  1.36*  1.23 ~  1.23   --   1.16   CALCIUM  8.9  9.0 ~ 9.0   --   9.0 ~ 9.2   MG  2.1*  1.7  1.7  1.8   PHOS  3.5  3.1  3.1  3.0 ~ 3.0     CBC  Recent Labs      03/06/17   0358  03/05/17   0340  03/04/17   0500   WBC  8.21  7.18  7.29   HGB  9.9*  9.6*  9.3*   HCT  31.0*  29.7*  28.8*   MCV  93.1  91.4  88.1   PLT  152  156  149     Coags  No results for input(s): INR, PT, APTT in the last 72 hours.  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  HIV - neg  Legionella Urinary Ag - neg  Chlamydia/gonorrhea PCR - neg  RPR nonreactive    IMAGING:     No new imaging    ASSESSMENT & PLAN: Ryan Shepard???is a 77 y.o.???male???with a history of COPD (on 3LNC), HFpEF, afib, HTN, DM2, PAD s/p femoral graft, prior prostate [place] who was admitted for hypoxemic respiratory failure requiring intubation.    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 CT head negative for brain mets.  - Heme-onc consulted, appreciate recs.   - Family meeting with wife (DPOA), daughter on phone, primary ICU team and Heme-Onc on 1/7 PM. Discussed benefits/adverse effects of chemotherapy and possibility that this may help decr tumor burden in lung as possible bridge to extubation. Family agreed to initiation.  - Cont chemo (caroplatin/etoposide) initiated 1/7 PM  - Cont Allopurinol 300mg  BID  - F/u G6PD testing in case rasburicase is needed  - TLS labs q8 (BMP, Phos, uric acid, LDH)  - Switch to SQ therapeutic lovenox in setting of malignancy    RESP:  #Acute on chronic hypoxemic respiratory failure  Admitted from clinic requiring BiPAP in setting of decreased arterial PaO2 and desats to low 80s. Recently admitted w/ bronch showing chronic colonization with aspergillus. Has features c/f malignancy on imaging. Respiratory status continues to worsen with worsened xray. S/p upsized ETT on 1/1 w/ resolution of cuff leak. Initial c/f PE, CTA read no PE so d/c'd heparin gtt, LE dopplers negative for DVT.   - ARDS  NET protocol  - Serial ABGs   - Malignancy tx, as below  - Antibiotics, as below    #Chronic COPD  Increased sputum production x 2 months, slight wheezing and recently treated with steroids while hospitalized. Current presentation less c/w COPD exacerbation.  - Duonebs q6h  - Inhaled Flovent BID  - Empiric abx, as below  ???  CV:  #Hypovolemic shock in setting of sedation, improving  Patient requires significant sedation d/t air leak on cuff of ETT, patient changing position can increase air leak. Hypotension requiring pressors in setting of sedation but elevated lactate and signs of end-organ damage including rising Cr. Patient has leukocytosis and on empiric abx, no other signs of infection.  - A-line for accurate BP monitoring  - Pressors: off levophed/vasopressin  - S/p hydrocortisone and fludrocortisone (1/2-1/7)  - Trend lactate  - Goal MAPs > 65 on A-line    #HFpEF  Home dose lasix PO 40mg  BID. TTE shows hyperdynamic EF >75% and elevated PASP to .  - Continue Martin lasix protocol: albumin 25g/25% q8h x 72hr, lasix 20mg  IV x 1, lasix gtt x 72hr (start at 5mg /hr and titrate to loss of 1kg/day)   - Goal net -1L over 24 hrs  - BID lytes, daily weights, strict I&O's     #Afib  Rate-controlled. CHADS2VaSC 3-4  - Hold home carvedilol 25 mg BID  - Hold home apixaban 5 mg BID  - Initiate therapeutic SQ lovenox in setting of malignancy    NEURO:  #Intubated and sedated 2/2 hypoxemic respiratory failure  - Cont sedation with fentanyl/precedex/propofol  - Attempt sedation holiday as tolerated  - TGs WNL    ID:  #Leukocytosis, improving  Afebrile. Increased sputum production x 2 months otherwise no localizing signs of infection. Did receive course of methylprednisolone 12/18-12/23 on recent hospitalization. 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 +Mycobacteria 12/13 BAL resulted on 03/01/17, not nocardia as previously thought.  - ID consulted, appreciate recs. Now signed off  - S/p IV Vanc  - S/p  Azithromycin  - F/u urine histoplasma Ag (sendout lab), hold antifungal tx at this time  - S/p IV Zosyn  - S/p IV Bactrim 500mg  q8hr given that patient does not have pulmonary nocardia  - Cont levofloxacin for atypical coverage/GN coverage (1/2- )  - Cont empiric IV cefepime for GP coverage (1/6- )  ??????  CHRONIC  #HTN  - Hold home BP meds in setting of shock: nifedipine 30mg  daily, losartan 100 mg daily, carvedilol 25 mg BID  ???  #Chronic???pain Localized to lower back and lower extremities  - Hold home Norco 5-325 q6h PRN while sedated  ???  #GERD chronic/stable  - Pantoprazole PO 40 mg daily 12/31  ???  #Depression/Anxiety  Mood stable, no SI, no anxiety  - Hold home buspirone 15 mg BID, duloxetine DR 60 while sedated  ???  #PAD  s/p femoral-femoral graft 2009.  - Hole home clopidogrel 75 mg daily  ???  #HLD chronic/stable  - Hold home Pravastatin 40 mg qhs  ???  #DM2 with associated diabetic peripheral neuropathy  - ISS#3 now that TPN started  - Hole home gabapentin 800 mg TID   ???  F - TFs via NGT. q6h refeeding labs, supp K/Mg/Phos PRN  A - Sedated  S - Fent/precedex/propofol  T - Switch to SQ lovenox  H - 30 degrees  U - PPI BID  G - ISS#3, Accuchecks q4h  B - Senna qhs, suppository, miralax  I - ETT 8.60mm (upsided 1/1), PIV 22g R hand (12/28), CVC 3-Lumen L fem (1/1), A-line (12/31), OGT (12/31), PIV x 1 L hand    Disposition: ICU level care required for hypoxemic respiratory failure requiring intubation  ???  Code Status: Full Code  Contact:  Primary Emergency ContactJacolby, [place], Home Phone: [phone]    DPOA is daughter Joquan Lotz daughter [phone]    Discussed with attending: Eliane Decree., MD, PhD    Jess Barters, PGY-1  [phone]

## 2017-03-06 NOTE — Nursing Note
03/05/17 0557   Height and Weight   Height 1.854 m (6' 0.99'')   Weight (!) 104.7 kg (230 lb 13.2 oz)   IBW/kg (calc)  79.88   BSA (Calculated - sq m) 2.32 sq meters   BMI (Calculated) 30.5   Weight in (lb) to have BMI = 25 189.1   Scale Type Used  Bed   BSA Note    Height: Height: 185.4 cm (6' 0.99'')  Weight: Weight: (!) 104.7 kg (230 lb 13.2 oz)    Manually calculated BSA = 2.32, is =  to the chemotherapy orders.  Using adjusted weight of 89.8 kg BSA of 2.15  Kathalene Frames RN

## 2017-03-06 NOTE — Other
Patients Clinical Goal:   Clinical Goal(s) for the Shift: 1- maintain adequate oxygenation with spo2 >88% while following ARDSnet with RT 2- maintain hemodynamic stability with map >43mmhg 3- maintain patient safey and comfort 4- Chemo initiation 5- TF tolerace with increase per order set   Identify possible barriers to advancing the care plan: acuity of illness, sedated on vent  Stability of the patient: Unstable - high likelihood or risk of patient condition declining or worsening; patient condition declining or worsening    End of Shift Summary:   1- patient maintained spo2 >88% for majority of night. Periods of desaturation to 85-87% occasional with turning, bathing and periods of wakefulness. ARDSnet followed per RT. Patient with moderate amount of thick white/yellow mild blood tinged secretions from ETT and oral cavity. Patient was weaned down to Fio2 60% unsuccessfully r/t decreasing PaO2 on serial ABGs. Following ARDSnet went back to 70% Fio2. PEEP remains 10. Peak pressures remain mid 20's. Most recent VBG:  Results for Ryan Shepard, Ryan Shepard (MRN [id number]) as of 03/06/2017 06:14   Ref. Range 03/06/2017 03:58   pH Latest Ref Range: 7.37 - 7.41  7.36 (L)   pCO2 Latest Ref Range: 38 - 42 mm Hg 46 (H)   pO2 Latest Ref Range: 98 - 118 mm Hg 61 (L)   Bicarbonate Latest Ref Range: 22.0 - 26.0 mmol/L 25.7   Base Excess Latest Ref Range: -2 - 2 mmol/L 1   O2 Sat/Measured Latest Ref Range: >=95.0 % 90.5 (L)   Inspired O2 Unknown 70% MV     2- patientt maintained adequate hemodynamics with maps >21mmhg. No vasopressor support required thru night. NSR 70-80s while sedated, ST 120-130's with wakeful periods. occassional PVCs and PACs noted intermittently. Tmax 38.2 controlled with ice packs.   3- patient remains in BLSWR r/t pulling at devices and risk for harm to self s/t AMS/sedation. Frequent RN rounds made to ensure safety  4- Chemo cycle 1 was initiated at 2240 per 6E chemo nurse. Chemo precautions initiated at that time of initiation. Patient tolerated infusion well. No N/V. Hydration with LR at 75cc/h initiated with chemo.   5- patient remains on peptamen 1.5 with prebio TF to OGT. Currently at 20cc/hr with scant residuals. Due to be increased to 30cc/h at 0900 per order comments. Goal remains 70cc/h    Patient remains sedated on precedex 0.7 mcg/kg/h, propofol 56mcg/kg/min, fentanyl 150 mcg/hr to. Opens eyes to pain.   Patient remains on q4h CBG requiring coverage  Left femoral CVC intact with all ports infusing no complication. Bilateral hand PIV. Lasix 10mg /hr infusing to right hand. Right radial Aline intact, good blood return, flushes easily, appropriate waveform noted.   CHG done.

## 2017-03-06 NOTE — Consults
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 As part of his workup, he was noted to have multiple hypervascular liver lesions, and diffuse intrathoracic lymphadenopathy, pleural nodule, and imaging findings concerning for lymphangitic spread and pathology from biopsy of liver lesion on 03/01/17 returned today as poorly differentiated neuroendocrine carcinoma. Hematology/Oncology was consulted with regards to these findings and for initiation of therapy.    To briefly review his recent/oncologic history:    12/6-12/26/18: Patient was admitted with worsening shortness of breath, likely multifactorial with COPD exacerbation in setting of Aspergillus pneumonia and volume overload. Imaging showed multiple liver lesions and diffuse intrathoracic lymphadenopathy concerning for metastatic cancer. Workup was deferred to outpatient setting, as patient was recovering from a respiratory status, was deemed to high risk for bronchoscopy/EBUS, and was on anticoagulation for atrial fibrillation.    02/16/17 CT chest with contrast showed prominent mediastinal and hilar lymphadenopathy, with encasement of left CCA and partial encasement of right innominate and left subclavian. Airspace and nodular consolidation prominent in LUL and lingula. Moderate left pleural effusion, with multiple pleural nodules. Small pericardial effusion.    02/16/17 CT A/P with contrast showed at least 4 hypervascular liver lesions within right hepatic lobe, largest measuring 5.0 x 3.6 cm.  Moderate atherosclerotic calcifications throughout abdominal vessels and renovascular calcifications. Patent fem-fem bypass. Multifocal arterial stenosis in femoral arteries.    March 08, 2017: Patient presented with worsening shortness of breath and acute hypoxemic respiratory failure requiring BiPAP initation, after desatting on home oxygen and was admitted to the MICU.  CXR showed moderate pleural effusion, left hilar mass with extension into upper lobe and enlarged silhouette. He was initiated on broad spectrum antibiotics, without improvement.    02/26/17: Intubated with bronchoscopy which showed extrinsic compression of LUL and intrinsic narrowing secondary to endobronchial abnormal mucosa and nodularity. LLL with thick, copious purulent secretion s/p therapeutic suctioning. BAL cytology was negative for malignant cells. Flow cytometry did not show evidence of lymphoproliferative disease.     02/27/17 CT chest, angiogram did not show evidence of PE. Redemonstration of airspace and nodular consolidation, most prominent in LUL and lingula, bilateral mediastinal and lymphadenopathy. Interval increase in moderate left pleural effusion. Multiple pleural nodules. Interval increase in small pericardial effusion.     03/01/17 Biopsy of left liver lesion showed metastatic poorly differentiated neuroendocrine carcinoma. Synaptophysin/chromogranin positive. TTF-1 patchy positive. PSA negative. Ki67 > 90%. Flow cytometry was limited due to low quantity of recoverable cells, but no discrete monotypic B-cell population seen.     PAST MEDICAL HISTORY     Past Medical History:   Diagnosis Date   ??? Cancer (HCC/RAF)    ??? Diabetes (HCC/RAF)    ??? GERD (gastroesophageal reflux disease)    ??? Hypercholesteremia    ??? Hypertension ??? PAD (peripheral artery disease) (HCC/RAF)    ??? 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 Prostate disease    ??? Scoliosis     adolesent   ??? Vascular disease      PAST SURGICAL HISTORY     Past Surgical History:   Procedure Laterality Date   ??? BACK SURGERY     ??? BILATERAL FEMORAL ARTERY EXPLORATION  2009   ??? BLADDER SURGERY  1999   ??? PROSTATE SURGERY  2000     HOME MEDICATIONS     Medications that the patient states to be currently taking   Medication Sig   ??? albuterol (2.5 mg/25mL) 0.083% nebulizer solution Inhale contents of 1 ampulr (34mL=2.5 mg total) by nebulization every four (4) hours as needed.   ??? albuterol 90 mcg/act inhaler Inhale 2 puffs every six (6) hours as needed.   ??? allopurinol 100 mg tablet Take 1 tablet (100 mg total) by mouth daily.   ??? apixaban 5 mg tablet Take 1 tablet (5 mg total) by mouth two (2) times daily.   ??? busPIRone 15 mg tablet Take 1 tablet (15 mg total) by mouth two (2) times daily.   ??? carvedilol 25 mg tablet Take 1 tablet (25 mg total) by mouth two (2) times daily.   ??? DULoxetine 60 mg DR capsule Take 1 capsule (60 mg total) by mouth daily.   ??? Fluticasone-Umeclidin-Vilant (TRELEGY ELLIPTA) 100-62.5-25 MCG/INH AEPB Inhale 1 puff daily.   ??? furosemide 40 mg tablet Take 1 tablet (40 mg total) by mouth two (2) times daily.   ??? gabapentin 400 mg capsule Take 2 capsules (800 mg total) by mouth three (3) times daily.   ??? hydrocodone-acetaminophen (NORCO) 10-325 mg tablet Take 1 tablet by mouth every four (4) to six (6) hours .   ??? linagliptin-metFORMIN (JENTADUETO) 2.06-998 MG TABS Take 1 tablet by mouth two (2) times daily.   ??? LORazepam 2 mg tablet Take 2 mg by mouth every four (4) hours as needed.   ??? losartan 100 mg tablet Take 1 tablet (100 mg total) by mouth daily.   ??? NIFEdipine (ADALAT CC) 30 mg 24 hr tablet Take 1 tablet (30 mg total) by mouth daily.   ??? pantoprazole 40 mg DR tablet Take 1 tablet (40 mg total) by mouth at bedtime. ??? pravastatin 40 mg tablet Take 1 tablet (40 mg total) by mouth at bedtime.   ??? senna 8.6 mg tablet Take 1 tablet by mouth at bedtime as needed for Constipation.   ??? Spacer/Aero-Holding Chambers (AEROCHAMBER PLUS FLO-VU) MISC Use as directed for inhalation     ALLERGIES   No Known Allergies  FAMILY HISTORY     Family History   Problem Relation Age of Onset   ??? Cancer Mother    ??? Cancer Father    ??? Malignant hyperthermia Neg Hx       SOCIAL HISTORY   Lives in [place] with his wife. He frequently travels to [place]. He is a former smoker, > 40 pack years.     REVIEW OF SYSTEMS   Review of systems is limited. Patient is currently intubated and sedated.    PHYSICAL EXAM     Last Recorded Vital Signs:    03/05/17 1630   BP:    Pulse: 75   Resp: 26   Temp:    SpO2: 93%       Intake/Output Summary (Last 24 hours) at 03/05/17 1734  Last data filed at 03/05/17 1731   Gross per 24 hour   Intake  2402.84 ml   Output             4845 ml   Net         -2442.16 ml      General: Intubated and sedated. Diffuse anasarca.  HEENT: Sclera anciteric. Conjunctiva pale. MMM.  CV: RRR. No frank m/r/g appreciated.  Pulm: Mechanically ventilated with symmetric chest rise. Coarse breath sounds.  Abd: +BS. Soft, mildly distended.  Ext: Trace lower extremity edema. No asymmetry noted.     OBJECTIVE DATA   Labs:  Lab Results   Component Value Date    WBC 7.18 03/05/2017    NEUTABS 5.84 03/05/2017    HGB 9.6 (L) 03/05/2017    HCT 29.7 (L) 03/05/2017    PLT 156 03/05/2017     Lab Results   Component Value Date    PT 16.2 (H) 2017-03-03    INR 1.4 03-Mar-2017    APTT 42.5 (H) 02/28/2017     Lab Results   Component Value Date    NA 145 03/05/2017    K 3.3 (L) 03/05/2017    CL 100 03/05/2017    CO2 25 03/05/2017    BUN 43 (H) 03/05/2017    CREAT 1.23 03/05/2017    GLUCOSE 136 (H) 03/05/2017    CALCIUM 9.0 03/05/2017    MG 1.7 03/05/2017    PHOS 3.1 03/05/2017    LDH 432 (H) 03/03/2017     Lab Results   Component Value Date APTT 42.5 (H) 02/28/2017    PT 16.2 (H) 03/03/2017    INR 1.4 03-Mar-2017     Lab Results   Component Value Date    ALT 12 03/05/2017    AST 16 03/05/2017    BILITOT 0.2 03/05/2017    ALKPHOS 46 03/05/2017    ALBUMIN 3.8 (L) 03/05/2017    TOTPRO 6.1 03/05/2017     Pertinent Microbiology:   1/5 L pleural fluid cx NGTD (exudative fluid)  1/3 Resp cx Probable C.albicans  1/2 BCx x2 NGTD  1/2 UA neg  12/31 BAL cx - neg or NGTD  12/28 Resp Cx Probable C.albicans  12/28 Blood Cx NTD  12/28 RVP neg  12/18 Resp cx Neg (C albicans)  12/13 BAL Nocardia cx: Rare beaded Gram positive rods --> fast-growing mycobacterium  12/13 AFB cx Mycobacterium species  12/13 BAL cx Neg  12/13 Respiratory culture: Candida, otherwise negative so far; AFB smear negative x 2  12/13 Legionella culture neg  Aspergillus Ag EIA - ???<0.50  RVP panel negative  Aspergillus fumigatus (M3) IgE - NEG   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+  Bacterial culture gram stain from vesicle - Coag neg staph like colonies, few enteroccocus like colonies  Cryptococcal Ag - neg  Cocci EIA/Ag - Negative  12/6 SputumCandida albicans and Aspergillus [place]  MRSA screen negative    Pertinent Imaging:   02/16/17 CT chest with contrast   - Showed prominent mediastinal and hilar lymphadenopathy, with encasement of left CCA and partial encasement of right innominate and left subclavian.   - Airspace and nodular consolidation prominent in LUL and lingula. Moderate left pleural effusion, with multiple pleural nodules. Small pericardial effusion.    02/16/17 CT A/P with contrast   - Showed at least 4 hypervascular liver lesions within right hepatic lobe, largest measuring 5.0 x 3.6 cm.    - Moderate atherosclerotic calcifications throughout abdominal vessels and renovascular calcifications. Patent fem-fem bypass. Multifocal arterial stenosis in femoral arteries.     02/27/17  CT chest, angiogram   - Did not show evidence of PE. - Redemonstration of airspace and nodular consolidation, most prominent in LUL and lingula, bilateral mediastinal and lymphadenopathy.   - Interval increase in moderate left pleural effusion. Multiple pleural nodules. Interval increase in small pericardial effusion.     Pertinent Pathology:   02/26/17 BAL    - Cytology was negative for malignant cells. Flow cytometry did not show evidence of lymphoproliferative disease.     03/01/17 Biopsy of left liver lesion   - Metastatic poorly differentiated neuroendocrine carcinoma. Synaptophysin/chromogranin positive. TTF-1 patchy positive. PSA negative. Ki67 > 90%. Flow cytometry was limited due to low quantity of recoverable cells, but no discrete monotypic B-cell population seen.     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 We will plan to start initiation of chemotherapy with palliative intent, as patient continues to decline from a respiratory status, currently intubated on ARDSnet protocol and this type of cancer can be very chemosensitive.    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 The goal of therapy is palliative. We will see how he does with induction and hope that his respiratory status will improve such that he can be extubated. Further treatment will be discussed pending possible response and clinical course. I discussed that there are significant risks associated with chemotherapy, but that given his dire clinical situation, the potential benefit outweighs the risk. Risks include but are not limited to tumor lysis syndrome, leading to renal failure with potential need for initiation of dialysis, worsening of fluid overload that may exacerbate respiratory status, and neutropenic sepsis.     Plan to start carboplatin/etoposide:  - Carboplatin IV AUC of 4 once D1 (dose reduced due to critical illness)  - Etoposide IV 80 mg/m2 D1-D3 (dose reduced due to critical illness and AKI)  - G-CSF following completion of chemotherapy    #Risk for tumor lysis syndrome  - At high-risk due to burden of disease, chemosensitivity of tumor and high Ki67.  - Start allopurinol 300 mg BID.  - Please send baseline TLS labs as well as G6PD testing in case rasburicase is needed.  - Would recommend initiating hydration with IVFs at 75 cc/hr; can adjust or diurese as needed based on volume status.  - Monitor TLS labs q8H (basic metabolic panel, [place], Phos, uric acid, LDH).    #Atrial fibrillation, on chronic anticoagulation  #DVT prophylaxis, high-risk in setting of immobilization and malignancy  - Expressed to primary team that home anticoagulation can be restarted if no additional concerns or procedures planned. Would at least switch from UFH to LMWH, due to higher incidence of HIT with UFH.     Patient discussed with attending Dr. Clifton James. Please feel free to page [phone] with questions/concerns.    Nydia Bouton  Hematology/Oncology Fellow  [id number]        The findings, assessment, and recommendations are as noted. MR.

## 2017-03-06 NOTE — Progress Notes
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 Started on palliative chemotherapy with carbo/etoposide 03/05/17.     Interval History/Subjective:  - 03/05/17: Started on carbo/etoposide last night.   - AM labs do not show evidence of TLS.    PHYSICAL EXAM     Last Recorded Vital Signs:    03/06/17 0930   BP:    Pulse: 67   Resp: 26   Temp: 36.9 ???C (98.4 ???F)   SpO2: 90%       Intake/Output Summary (Last 24 hours) at 03/06/17 0958  Last data filed at 03/06/17 0900   Gross per 24 hour   Intake          3892.76 ml   Output             4300 ml   Net          -407.24 ml      General: Intubated and sedated. Diffuse anasarca.  HEENT: Sclera anciteric. Conjunctiva pale. MMM.  CV: RRR. No frank m/r/g appreciated.  Pulm: Mechanically ventilated with symmetric chest rise. Coarse breath sounds.  Abd: +BS. Soft, mildly distended.  Ext: Trace lower extremity edema. No asymmetry noted.     OBJECTIVE DATA   LABS  Lab Results   Component Value Date    WBC 8.21 03/06/2017    NEUTABS 7.05 (H) 03/06/2017    HGB 9.9 (L) 03/06/2017    HCT 31.0 (L) 03/06/2017    PLT 152 03/06/2017     Lab Results   Component Value Date    PT 16.2 (H) 02/11/2017    INR 1.4 02/18/2017    APTT 42.5 (H) 02/28/2017     Lab Results Component Value Date    NA 144 03/06/2017    K 4.0 03/06/2017    CL 101 03/06/2017    CO2 25 03/06/2017    BUN 49 (H) 03/06/2017    CREAT 1.36 (H) 03/06/2017    GLUCOSE 248 (H) 03/06/2017    CALCIUM 8.9 03/06/2017    MG 2.1 (H) 03/06/2017    PHOS 3.5 03/06/2017    LDH 302 (H) 03/06/2017    URICACID 3.8 03/06/2017     Lab Results   Component Value Date    APTT 42.5 (H) 02/28/2017    PT 16.2 (H) 02/20/2017    INR 1.4 02/17/2017     Lab Results   Component Value Date    ALT 13 03/06/2017    AST 20 03/06/2017    BILITOT 0.3 03/06/2017    ALKPHOS 45 03/06/2017    ALBUMIN 4.0 03/06/2017    TOTPRO 6.1 03/06/2017     Pertinent Microbiology:   1/5 L pleural fluid cx NGTD (exudative fluid)  1/3 Resp cx Probable C.albicans  1/2 BCx x2 NGTD  1/2 UA neg  12/31 BAL cx - neg or NGTD  12/28 Resp Cx Probable C.albicans  12/28 Blood Cx NTD  12/28 RVP neg  12/18 Resp cx Neg (C albicans)  12/13 BAL Nocardia cx: Rare beaded Gram positive rods --> fast-growing mycobacterium  12/13 AFB cx Mycobacterium species  12/13 BAL cx Neg  12/13 Respiratory culture: Candida, otherwise negative so far; AFB smear negative x 2  12/13 Legionella culture neg  Aspergillus Ag EIA - ???<0.50  RVP panel negative  Aspergillus fumigatus (M3) IgE - NEG   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  Pertinent Imaging:   02/16/17 CT chest with contrast   - Showed prominent mediastinal and hilar lymphadenopathy, with encasement of left CCA and partial encasement of right innominate and left subclavian.   - Airspace and nodular consolidation prominent in LUL and lingula. Moderate left pleural effusion, with multiple pleural nodules. Small pericardial effusion.  ???  02/16/17 CT A/P with contrast - Showed at least 4 hypervascular liver lesions within right hepatic lobe, largest measuring 5.0 x 3.6 cm.    - Moderate atherosclerotic calcifications throughout abdominal vessels and renovascular calcifications. Patent fem-fem bypass. Multifocal arterial stenosis in femoral arteries.     02/27/17 CT chest, angiogram   - Did not show evidence of PE.   - Redemonstration of airspace and nodular consolidation, most prominent in LUL and lingula, bilateral mediastinal and lymphadenopathy.   - Interval increase in moderate left pleural effusion. Multiple pleural nodules. Interval increase in small pericardial effusion.   ???  Pertinent Pathology:   02/26/17 BAL    - Cytology was negative for malignant cells. Flow cytometry did not show evidence of lymphoproliferative disease.   ???  03/01/17 Biopsy of left liver lesion   - Metastatic poorly differentiated neuroendocrine carcinoma. Synaptophysin/chromogranin positive. TTF-1 patchy positive. PSA negative. Ki67 > 90%. Flow cytometry was limited due to low quantity of recoverable cells, but no discrete monotypic B-cell population seen.       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 Palliative chemotherapy with carbo/etoposide was started on 03/05/17.  ???  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 The goal of therapy is palliative. We will see how he does with induction and hope that his respiratory status will improve such that he can be extubated. Further treatment will be discussed pending possible response and clinical course. I discussed that there are significant risks associated with chemotherapy, but that given his dire clinical situation, the potential benefit outweighs the risk. Risks include but are not limited to tumor lysis syndrome, leading  to renal failure with potential need for initiation of dialysis, worsening of fluid overload that may exacerbate respiratory status, and neutropenic sepsis.   ???  - Carboplatin IV AUC of 4 once D1 (dose reduced due to critical illness) --> done  - Etoposide IV 80 mg/m2 D1-D3 (dose reduced due to critical illness and AKI) --> anticipate finishing on 1/9  - G-CSF following completion of chemotherapy  ???  #Risk for tumor lysis syndrome  - At high-risk due to burden of disease, chemosensitivity of tumor and high Ki67.  - Continue allopurinol 300 mg BID.  - Monitor TLS labs q8H (basic metabolic panel, [place], Phos, uric acid, LDH).  - Deferring management of IVFs and diuresis to primary team, but would encourage some form of fluids and close monitoring of renal function.  ???  #Atrial fibrillation, on chronic anticoagulation  #DVT prophylaxis, high-risk in setting of immobilization and malignancy  - Expressed to primary team that home anticoagulation can be restarted if no additional concerns or procedures planned, but would recommend watching platelet count as at it may drop below < 50 at the chemotherapeutic nadir.   - Currently on LMWH 40 BID.  ???  Patient seen and discussed with attending Dr. Clifton James. Please feel free to page [phone] with questions/concerns.  ???  Nydia Bouton  Hematology/Oncology Fellow  [phone]  ???

## 2017-03-06 NOTE — Other
Patients Clinical Goal:   Clinical Goal(s) for the Shift: 1. Maintain pt safety and comfort 2. hemodynamic stability; Map>65,NSR 3. Oxygenation: ARDsnet protocol, 02 sat>88%  Identify possible barriers to advancing the care plan: progressive disease process  Stability of the patient: Unstable - high likelihood or risk of patient condition declining or worsening; patient condition declining or worsening    End of Shift Summary: 1. Continues on propofol, fentanyl, and precedex for pt comfort. Tolerated turning and cares. BSW continue for safety.  2. Map maintained>65 throughout shift. Short episode of aflutter self resolved. Increased ectopy noted this afternoon, improved with electrolyte replacement  3. Continue on ARDsnet. Noted increased 02 requirements while pt was positioned on right side/02 increased to 70% but now back down to 60%. Continues on peep of 10. Suctioned q2 hrs for thick pale yellow/white.  Spouse at [facility] updated re: path results and need for chemo. Plan to start treatment tonight per oncology if she agrees.  TF changed to peptamen 1.5. No stool this shift.

## 2017-03-07 ENCOUNTER — Ambulatory Visit: Payer: PRIVATE HEALTH INSURANCE

## 2017-03-07 DIAGNOSIS — I480 Paroxysmal atrial fibrillation: Secondary | ICD-10-CM

## 2017-03-07 DIAGNOSIS — N179 Acute kidney failure, unspecified: Secondary | ICD-10-CM

## 2017-03-07 DIAGNOSIS — J910 Malignant pleural effusion: Secondary | ICD-10-CM

## 2017-03-07 LAB — Acid-Fast Culture and Stain, Respiratory

## 2017-03-07 LAB — Uric Acid
URIC ACID: 4.4 mg/dL (ref 3.4–8.8)
URIC ACID: 4.5 mg/dL (ref 3.4–8.8)
URIC ACID: 5.1 mg/dL (ref 3.4–8.8)

## 2017-03-07 LAB — Bacterial Culture-Gm Stain

## 2017-03-07 LAB — Hepatic Funct Panel: TOTAL PROTEIN: 5.9 g/dL — ABNORMAL LOW (ref 6.1–8.2)

## 2017-03-07 LAB — Sodium,Random,Ur: SODIUM,RANDOM URINE: 88 mmol/L

## 2017-03-07 LAB — Basic Metabolic Panel
CALCIUM: 9.5 mg/dL (ref 8.6–10.4)
CHLORIDE: 101 mmol/L (ref 96–106)
GLUCOSE: 124 mg/dL — ABNORMAL HIGH (ref 65–99)
GLUCOSE: 124 mg/dL — ABNORMAL HIGH (ref 65–99)
SODIUM: 145 mmol/L (ref 135–146)
TOTAL CO2: 22 mmol/L (ref 20–30)

## 2017-03-07 LAB — Creatinine,Random Urine: CREATININE,RANDOM URINE: 21.8 mg/dL

## 2017-03-07 LAB — Blood Gases, arterial
ABG INSPIRED O2: 70 mmol/L (ref 22.0–26.0)
BASE EXCESS: 1 mmol/L (ref ?–2)
BICARBONATE: 24.5 mmol/L (ref 22.0–26.0)
BICARBONATE: 24 mmol/L (ref 22.0–26.0)
PH: 7.32 mmol/L — ABNORMAL LOW (ref 7.37–7.41)

## 2017-03-07 LAB — Glucose,POC
GLUCOSE,POC: 109 mg/dL — ABNORMAL HIGH (ref 65–99)
GLUCOSE,POC: 119 mg/dL — ABNORMAL HIGH (ref 65–99)
GLUCOSE,POC: 149 mg/dL — ABNORMAL HIGH (ref 65–99)
GLUCOSE,POC: 155 mg/dL — ABNORMAL HIGH (ref 65–99)
GLUCOSE,POC: 154 mg/dL — ABNORMAL HIGH (ref 65–99)
GLUCOSE,POC: 122 mg/dL — ABNORMAL HIGH (ref 65–99)

## 2017-03-07 LAB — Triglycerides: TRIGLYCERIDES: 138 mg/dL (ref <150)

## 2017-03-07 LAB — Phosphorus
PHOSPHORUS: 4.6 mg/dL — ABNORMAL HIGH (ref 2.3–4.4)
PHOSPHORUS: 4.5 mg/dL — ABNORMAL HIGH (ref 2.3–4.4)
PHOSPHORUS: 4.7 mg/dL — ABNORMAL HIGH (ref 2.3–4.4)

## 2017-03-07 LAB — Urea Nitrogen,Random Urine: UREA NITROGEN,RANDOM URINE: 221 mg/dL

## 2017-03-07 LAB — CYTOLOGY, BODY CAVITY FLUID

## 2017-03-07 LAB — Blood Lactate: BLOOD LACTATE: 40 mg/dL — ABNORMAL HIGH (ref 5–25)

## 2017-03-07 LAB — Magnesium
MAGNESIUM: 2 meq/L — ABNORMAL HIGH (ref 1.4–1.9)
MAGNESIUM: 1.9 meq/L (ref 1.4–1.9)

## 2017-03-07 LAB — Lactate Dehydrogenase
LACTATE DEHYDROGENASE: 342 U/L — ABNORMAL HIGH (ref 125–256)
LACTATE DEHYDROGENASE: 340 U/L — ABNORMAL HIGH (ref 125–256)
LACTATE DEHYDROGENASE: 394 U/L — ABNORMAL HIGH (ref 125–256)

## 2017-03-07 LAB — Differential Automated: NEUTROPHIL PERCENT, AUTO: 78.5 (ref 1.30–3.40)

## 2017-03-07 LAB — CBC: NUCLEATED RBC%, AUTOMATED: 0.1 (ref 9.3–13.0)

## 2017-03-07 MED ADMIN — PROPOFOL INFUSION 10 MG/ML: 7755 ug/min | INTRAVENOUS | @ 19:00:00 | Stop: 2017-03-14 | NDC 63323026965

## 2017-03-07 MED ADMIN — POTASSIUM CHLORIDE 20 MEQ/50ML RTU: 20 meq | INTRAVENOUS | @ 16:00:00 | Stop: 2017-03-07 | NDC 00338070341

## 2017-03-07 MED ADMIN — ENOXAPARIN SODIUM 100 MG/ML SC SOLN: 100 mg | SUBCUTANEOUS | @ 06:00:00 | Stop: 2017-03-07 | NDC 63323056884

## 2017-03-07 MED ADMIN — PROPOFOL INFUSION 10 MG/ML: 7755 ug/min | INTRAVENOUS | @ 11:00:00 | Stop: 2017-03-14 | NDC 63323026965

## 2017-03-07 MED ADMIN — IPRATROPIUM BROMIDE 0.02 % IN SOLN: 500 ug | RESPIRATORY_TRACT | @ 01:00:00 | Stop: 2017-03-07 | NDC 00487980101

## 2017-03-07 MED ADMIN — ALBUTEROL SULFATE (5 MG/ML) 0.5% IN NEBU: 2.5 mg | RESPIRATORY_TRACT | @ 13:00:00 | Stop: 2017-03-07 | NDC 00487990130

## 2017-03-07 MED ADMIN — ALLOPURINOL 300 MG PO TABS: 300 mg | ORAL | @ 17:00:00 | Stop: 2017-03-10 | NDC 51079020620

## 2017-03-07 MED ADMIN — ALBUTEROL SULFATE (5 MG/ML) 0.5% IN NEBU: 2.5 mg | RESPIRATORY_TRACT | @ 01:00:00 | Stop: 2017-03-07 | NDC 00487990130

## 2017-03-07 MED ADMIN — ETOPOSIDE CHEMO INFUSION: 172 mg | INTRAVENOUS | @ 06:00:00 | Stop: 2017-03-08 | NDC 16729011431

## 2017-03-07 MED ADMIN — BISACODYL 10 MG RE SUPP: 10 mg | RECTAL | @ 05:00:00 | Stop: 2017-03-07 | NDC 00904505860

## 2017-03-07 MED ADMIN — POTASSIUM CHLORIDE 20 MEQ/50ML RTU: 20 meq | INTRAVENOUS | @ 17:00:00 | Stop: 2017-03-07 | NDC 00338070341

## 2017-03-07 MED ADMIN — FUROSEMIDE 10 MG/ML DRIP (STRAIGHT DRUG): 30 mg/h | INTRAVENOUS | @ 08:00:00 | Stop: 2017-03-09

## 2017-03-07 MED ADMIN — ALLOPURINOL 300 MG PO TABS: 300 mg | ORAL | @ 05:00:00 | Stop: 2017-03-10 | NDC 51079020620

## 2017-03-07 MED ADMIN — PROPOFOL INFUSION 10 MG/ML: 7755 ug/min | INTRAVENOUS | @ 08:00:00 | Stop: 2017-03-14 | NDC 63323026965

## 2017-03-07 MED ADMIN — ALBUTEROL SULFATE (5 MG/ML) 0.5% IN NEBU: 2.5 mg | RESPIRATORY_TRACT | @ 08:00:00 | Stop: 2017-03-07 | NDC 00487990130

## 2017-03-07 MED ADMIN — INSULIN ASPART 100 UNIT/ML SC SOPN: 3 mL | SUBCUTANEOUS | @ 17:00:00 | Stop: 2017-03-10 | NDC 00169633910

## 2017-03-07 MED ADMIN — POTASSIUM CHLORIDE 20 MEQ/50ML RTU: 20 meq | INTRAVENOUS | @ 18:00:00 | Stop: 2017-03-07 | NDC 00338070341

## 2017-03-07 MED ADMIN — PANTOPRAZOLE SODIUM 40 MG IV SOLR: 40 mg | INTRAVENOUS | @ 07:00:00 | Stop: 2017-03-13 | NDC 55150020210

## 2017-03-07 MED ADMIN — POTASSIUM CHLORIDE 20 MEQ/15ML (10%) PO SOLN: 40 meq | NASOGASTRIC | @ 15:00:00 | Stop: 2017-03-07

## 2017-03-07 MED ADMIN — CEFEPIME 1 GM/50 ML RTU: 1 g | INTRAVENOUS | @ 07:00:00 | Stop: 2017-03-08 | NDC 00338130141

## 2017-03-07 MED ADMIN — INSULIN ASPART 100 UNIT/ML SC SOPN: 3 mL | SUBCUTANEOUS | @ 08:00:00 | Stop: 2017-03-10 | NDC 00169633910

## 2017-03-07 MED ADMIN — IPRATROPIUM BROMIDE 0.02 % IN SOLN: 500 ug | RESPIRATORY_TRACT | @ 13:00:00 | Stop: 2017-03-07 | NDC 00487980101

## 2017-03-07 MED ADMIN — POTASSIUM CHLORIDE 20 MEQ/15ML (10%) PO SOLN: 40 meq | NASOGASTRIC | @ 02:00:00 | Stop: 2017-03-07 | NDC 66689004850

## 2017-03-07 MED ADMIN — BISACODYL 10 MG RE SUPP: 10 mg | RECTAL | @ 22:00:00 | Stop: 2017-03-07 | NDC 00904505860

## 2017-03-07 MED ADMIN — PROPOFOL INFUSION 10 MG/ML: 7755 ug/min | INTRAVENOUS | @ 16:00:00 | Stop: 2017-03-14 | NDC 63323026965

## 2017-03-07 MED ADMIN — MAGNESIUM SULFATE 4 GM/100ML IV SOLN: 4 g | INTRAVENOUS | @ 14:00:00 | Stop: 2017-03-07 | NDC 63323010601

## 2017-03-07 MED ADMIN — ALBUTEROL SULFATE (5 MG/ML) 0.5% IN NEBU: 2.5 mg | RESPIRATORY_TRACT | @ 20:00:00 | Stop: 2017-03-07 | NDC 00487990130

## 2017-03-07 MED ADMIN — SENNOSIDES 8.6 MG PO TABS: 2 | ORAL | @ 17:00:00 | Stop: 2017-03-11 | NDC 00904652261

## 2017-03-07 MED ADMIN — METOLAZONE 5 MG PO TABS: 5 mg | OROGASTRIC | @ 05:00:00 | Stop: 2017-03-07 | NDC 51079002420

## 2017-03-07 MED ADMIN — ONDANSETRON HCL 4 MG/2ML IJ SOLN: 8 mg | INTRAVENOUS | @ 17:00:00 | Stop: 2017-03-09 | NDC 00641607825

## 2017-03-07 MED ADMIN — ENOXAPARIN SODIUM 100 MG/ML SC SOLN: 100 mg | SUBCUTANEOUS | @ 19:00:00 | Stop: 2017-03-07 | NDC 63323056884

## 2017-03-07 MED ADMIN — POLYETHYLENE GLYCOL 3350 17 G PO PACK: 34 g | ORAL | @ 05:00:00 | Stop: 2017-03-13 | NDC 68084043098

## 2017-03-07 MED ADMIN — POLYETHYLENE GLYCOL 3350 17 G PO PACK: 34 g | ORAL | @ 17:00:00 | Stop: 2017-03-13 | NDC 68084043098

## 2017-03-07 MED ADMIN — FENTANYL 2.5 MG/100 ML NS DRIP: 50 ug/h | INTRAVENOUS | @ 01:00:00 | Stop: 2017-03-13 | NDC 00409909461

## 2017-03-07 MED ADMIN — IPRATROPIUM BROMIDE 0.02 % IN SOLN: 500 ug | RESPIRATORY_TRACT | @ 20:00:00 | Stop: 2017-03-07 | NDC 00487980101

## 2017-03-07 MED ADMIN — DEXMEDETOMIDINE 4 MCG/ML DRIP: 20.68 ug/h | INTRAVENOUS | @ 05:00:00 | Stop: 2017-03-11 | NDC 55150020902

## 2017-03-07 MED ADMIN — SENNOSIDES 8.6 MG PO TABS: 2 | ORAL | @ 05:00:00 | Stop: 2017-03-11 | NDC 00904652261

## 2017-03-07 MED ADMIN — NOREPINEPHRINE 8, 16, 32 MG/250 ML DRIP: 10.34103.4 ug/min | INTRAVENOUS | @ 02:00:00 | Stop: 2017-03-10 | NDC 36000016210

## 2017-03-07 MED ADMIN — POTASSIUM CHLORIDE 20 MEQ/50ML RTU: 20 meq | INTRAVENOUS | @ 14:00:00 | Stop: 2017-03-07 | NDC 00338070341

## 2017-03-07 MED ADMIN — LEVOFLOXACIN 750 MG/150 ML RTU: 750 mg | INTRAVENOUS | @ 22:00:00 | Stop: 2017-03-08 | NDC 00143972024

## 2017-03-07 MED ADMIN — DEXMEDETOMIDINE 4 MCG/ML DRIP: 20.68 ug/h | INTRAVENOUS | @ 17:00:00 | Stop: 2017-03-11

## 2017-03-07 MED ADMIN — DEXMEDETOMIDINE 4 MCG/ML DRIP: 20.68 ug/h | INTRAVENOUS | @ 19:00:00 | Stop: 2017-03-11 | NDC 55150020902

## 2017-03-07 MED ADMIN — FUROSEMIDE 10 MG/ML DRIP (STRAIGHT DRUG): 30 mg/h | INTRAVENOUS | @ 13:00:00 | Stop: 2017-03-09 | NDC 36000028425

## 2017-03-07 MED ADMIN — CHLORHEXIDINE GLUCONATE 0.12 % MT SOLN: 10 mL | OROMUCOSAL | @ 17:00:00 | Stop: 2017-03-13 | NDC 52376002110

## 2017-03-07 MED ADMIN — INSULIN ASPART 100 UNIT/ML SC SOPN: 3 mL | SUBCUTANEOUS | @ 12:00:00 | Stop: 2017-03-10 | NDC 00169633910

## 2017-03-07 MED ADMIN — FENTANYL 2.5 MG/100 ML NS DRIP: 50 ug/h | INTRAVENOUS | @ 17:00:00 | Stop: 2017-03-13 | NDC 00409909461

## 2017-03-07 MED ADMIN — IPRATROPIUM BROMIDE 0.02 % IN SOLN: 500 ug | RESPIRATORY_TRACT | @ 08:00:00 | Stop: 2017-03-07 | NDC 00487980101

## 2017-03-07 MED ADMIN — THIAMINE HCL 100 MG PO TABS: 100 mg | ORAL | @ 18:00:00 | Stop: 2017-03-13

## 2017-03-07 MED ADMIN — CEFEPIME 1 GM/50 ML RTU: 1 g | INTRAVENOUS | @ 17:00:00 | Stop: 2017-03-08 | NDC 00338130141

## 2017-03-07 MED ADMIN — ONDANSETRON HCL 4 MG/2ML IJ SOLN: 8 mg | INTRAVENOUS | @ 08:00:00 | Stop: 2017-03-09 | NDC 00641607825

## 2017-03-07 MED ADMIN — ONDANSETRON HCL 4 MG/2ML IJ SOLN: 8 mg | INTRAVENOUS | @ 02:00:00 | Stop: 2017-03-09 | NDC 00641607825

## 2017-03-07 MED ADMIN — CEFEPIME 1 GM/50 ML RTU: 1 g | INTRAVENOUS | @ 01:00:00 | Stop: 2017-03-08 | NDC 00338130141

## 2017-03-07 MED ADMIN — PROPOFOL INFUSION 10 MG/ML: 7755 ug/min | INTRAVENOUS | @ 05:00:00 | Stop: 2017-03-14 | NDC 63323026965

## 2017-03-07 MED ADMIN — PROPOFOL INFUSION 10 MG/ML: 7755 ug/min | INTRAVENOUS | Stop: 2017-03-14 | NDC 63323026965

## 2017-03-07 MED ADMIN — CHLORHEXIDINE GLUCONATE 0.12 % MT SOLN: 10 mL | OROMUCOSAL | @ 05:00:00 | Stop: 2017-03-13 | NDC 52376002110

## 2017-03-07 NOTE — Nursing Note
1900 Received report.   2000 Assessments done. Teaching is done. Medications given. Labs sent. TF residuals around 200cc TF stopped Md notified.  2100 lab results reviewed. Fem CVC removed per Md order, pt tolerated well no SS of bleeding.   2200 CHG is done. Chemo given.   2300 lasix gtt titrated per Md order.   0000 Assessments done. Teaching is done. Medications given. Labs sent. TF residuals still >200.  0100 lab results reviewed and paged to RT, vent setting per ARDS NET.  0400 Assessments done. Teaching is done. Medications given. Labs sent. TF residuals still>200 and its on hold.   0500 lab results reviewed and paged to RT and MD.   0600 electrolytes ordered and started to replace the deficiency.

## 2017-03-07 NOTE — Other
Patients Clinical Goal:   Clinical Goal(s) for the Shift: 1. Maintain MAP > 65. 2. ARDS Net Protocol Spo2 > 88%. 3. Picc line Insertion. 4. TF. 5. I & O Monitor. 6. Maintain Patient's Safety and Comfort.  Identify possible barriers to advancing the care plan:   Stability of the patient: Moderately Unstable - medium risk of patient condition declining or worsening    End of Shift Summary:  Levophed started much later in the shift to Maintain MAP > 65.  Patient remained on ARDS Net Protocol with spo2 > 88%. Patient desaturated x1 episode < 88%. MICU Team made aware and new orders noted.    Results for SAVIAN, MAZON (MRN [id number]) as of 03/06/2017 18:38   Ref. Range 03/06/2017 16:48   pH Latest Ref Range: 7.37 - 7.41  7.40   pCO2 Latest Ref Range: 38 - 42 mm Hg 43 (H)   pO2 Latest Ref Range: 98 - 118 mm Hg 69 (L)   Bicarbonate Latest Ref Range: 22.0 - 26.0 mmol/L 25.8   Base Excess Latest Ref Range: -2 - 2 mmol/L 1   O2 Sat/Measured Latest Ref Range: >=95.0 % 93.5 (L)   Inspired O2 Unknown 70%   Patient continued on lasisx gtt, currently infusing at 20 mg/hr.  Potassium result of 3.6 replaced per order.  Right upper arm double lumen cath completely inserted at bedside without complications.  Order okay to use.  Patient remained on precedex, propofol and fentanyl gtt for comfort.  No distress noted this shift.  Family of patient visiting and were updated about plan of care.  Patient's safety and comfort maximized.

## 2017-03-07 NOTE — Progress Notes
MICU Attending Note    Pt Name:  Ryan Shepard  MRN: [id number]  Date: 03/06/2017  Time of initial visit: 1200      Remains intubated and mechanically ventilated  S/p initiation of induction chemotherapy      Temp:  [36.8 ???C (98.2 ???F)-38.2 ???C (100.8 ???F)] 37.4 ???C (99.3 ???F)  Heart Rate:  [66-121] 68  Resp:  [19-26] 26  Arterial Line BP (mmHg): (90-128)/(46-64) 106/50  MAP:  [60 mmHg-85 mmHg] 67 mmHg  FiO2 (%):  [50 %-100 %] 70 %  SpO2:  [85 %-97 %] 88 %     Intake/Output Summary (Last 24 hours) at 03/06/17 1748  Last data filed at 03/06/17 1700   Gross per 24 hour   Intake           4011.4 ml   Output             3980 ml   Net             31.4 ml       Vent Mode: VC  FiO2 (%):  [50 %-100 %] 70 %  Rate:  [26] 26  VT (mL):  [480 mL] 480 mL  PEEP (setting):  [10 cmH2O-12 cmH2O] 12 cmH2O     PHYSICAL EXAM  General: Intubated  HEENT: NC/AT, +ETT  Neck: No TM, No LAN  Lungs: Coarse breath sounds bilaterally  CV: Regular rhythm  Abd: +BS, nontender, nondistended  Ext: No C/C/E  Neuro: Sedated      Chemistries  Recent Labs      03/06/17   1648  03/06/17   1153  03/06/17   0358  03/05/17   1522   NA   --   145  144  145 ~ 145   K   --   3.6  4.0  3.3* ~ 3.3*   CL   --   99  101  100 ~ 100   CO2   --   25  25  25  ~ 25   BUN   --   53*  49*  43* ~ 43*   CREAT   --   1.27  1.36*  1.23 ~ 1.23   GLUCOSE   --   137*  248*  136* ~ 136*   CALCIUM   --   9.8  8.9  9.0 ~ 9.0   MG  2.0*   --   2.1*  1.7   PHOS   --   4.4  3.5  3.1     LFTs  Recent Labs      03/06/17   0358  03/05/17   1522  03/05/17   0340  03/04/17   0500   AST  20  30  16   10*   ALT  13  16  12  11    ALKPHOS  45  48  46  43   BILITOT  0.3  0.2  0.2  <0.2   BILICON  0.2   --   0.2  <0.2   TOTPRO  6.1  6.1  6.1  5.8*   ALBUMIN  4.0  3.7*  3.8*  3.2*     Hematology  Recent Labs      03/06/17   0358  03/05/17   0340  03/04/17   0500   WBC  8.21  7.18  7.29   HGB  9.9*  9.6*  9.3*   PLT  152  156  149  Coagulation profile No results for input(s): PT, INR, APTT in the last 72 hours.    Lab Results   Component Value Date    PHART 7.40 03/06/2017    PHART 7.36 (@) 01/14/2008    PO2ART 69 (L) 03/06/2017    PO2ART 77 (@) 01/14/2008    PCO2ART 43 (H) 03/06/2017    PCO2ART 50 (@) 01/14/2008    BICARBART 25.8 03/06/2017    BICARBART 27.6 (@) 01/14/2008    BEART 1 03/06/2017    BEART 2 01/14/2008       CXR - L>R opacities, PVC    A/P -       1) Small cell lung cancer - chemotherapy in hopes of reducing tumor burden so that pt can perhaps undergo a trial of extubation  2) Acute hypoxic respiratory failure - due to #1, #2, #3  ?????????- lung protective ventilation  ?????????- HOB 30-45 and oral care  ?????????- SATs and SBTs     - empiric antibiotics  3) Exudative left effusion - likely due to #1     - f/u cytology  4) Prostate ca  5) DVT/GI prophylaxis  6) Nutrition  7) Physical therapy and mobilization when appropriate    Pt discussed, seen and examined with housestaff.  Please see their notes for further details.  Critical Care Time provided = 40 minutes,  exclusive of procedures.      Eliane Decree MD PhD  Associate Clinical Professor of Medicine  Pulmonary & Critical Care Medicine  [employer] at [facility]

## 2017-03-07 NOTE — Progress Notes
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 Started on palliative chemotherapy with carbo/etoposide 03/05/17.     Interval History/Subjective:  - C1D3 of carbo/etoposide  - Remains on high vent settings and low-dose norepi.   - Labs do not show clear evidence of TLS, but Cr is up to 1.72 today.    PHYSICAL EXAM     Last Recorded Vital Signs:    03/07/17 1115   BP:    Pulse: 81   Resp: 26   Temp: 37.4 ???C (99.3 ???F)   SpO2: 92%       Intake/Output Summary (Last 24 hours) at 03/07/17 1200  Last data filed at 03/07/17 1100   Gross per 24 hour   Intake          2748.99 ml   Output             2810 ml   Net           -61.01 ml      General: Intubated and sedated. Diffuse anasarca.  HEENT: Sclera anciteric. Conjunctiva pale. MMM.  CV: RRR. No frank m/r/g appreciated.  Pulm: Mechanically ventilated with symmetric chest rise. Coarse breath sounds.  Abd: +BS. Soft, mildly distended.  Ext: Trace lower extremity edema. No asymmetry noted.     OBJECTIVE DATA   LABS  Lab Results   Component Value Date    WBC 8.75 03/07/2017    NEUTABS 6.87 03/07/2017    HGB 10.5 (L) 03/07/2017    HCT 33.4 (L) 03/07/2017    PLT 184 03/07/2017     Lab Results   Component Value Date    PT 16.2 (H) 2017/03/15    INR 1.4 03-15-2017 APTT 42.5 (H) 02/28/2017     Lab Results   Component Value Date    NA 145 03/07/2017    K 3.4 (L) 03/07/2017    CL 100 03/07/2017    CO2 24 03/07/2017    BUN 67 (H) 03/07/2017    CREAT 1.72 (H) 03/07/2017    GLUCOSE 158 (H) 03/07/2017    CALCIUM 9.5 03/07/2017    MG 1.9 03/07/2017    PHOS 4.5 (H) 03/07/2017    LDH 340 (H) 03/07/2017    URICACID 4.5 03/07/2017     Lab Results   Component Value Date    APTT 42.5 (H) 02/28/2017    PT 16.2 (H) 03/15/17    INR 1.4 2017/03/15     Lab Results   Component Value Date    ALT 16 03/07/2017    AST 20 03/07/2017    BILITOT 0.3 03/07/2017    ALKPHOS 49 03/07/2017    ALBUMIN 3.8 (L) 03/07/2017    TOTPRO 5.9 (L) 03/07/2017     Pertinent Microbiology:   1/5 L pleural  fluid cx NGTD (exudative fluid)  1/3 Resp cx Probable C.albicans  1/2 BCx x2 NGTD  1/2 UA neg  12/31 BAL cx - neg or NGTD  12/28 Resp Cx Probable C.albicans  12/28 Blood Cx NTD  12/28 RVP neg  12/18 Resp cx Neg (C albicans)  12/13 BAL Nocardia cx: Rare beaded Gram positive rods --> fast-growing mycobacterium  12/13 AFB cx Mycobacterium species  12/13 BAL cx Neg  12/13 Respiratory culture: Candida, otherwise negative so far; AFB smear negative x 2  12/13 Legionella culture neg  Aspergillus Ag EIA - ???<0.50  RVP panel negative  Aspergillus fumigatus (M3) IgE - NEG   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  Pertinent Imaging:   02/16/17 CT chest with contrast   - Showed prominent mediastinal and hilar lymphadenopathy, with encasement of left CCA and partial encasement of right innominate and left subclavian.   - Airspace and nodular consolidation prominent in LUL and lingula. Moderate left pleural effusion, with multiple pleural nodules. Small pericardial effusion.  ???  02/16/17 CT A/P with contrast - Showed at least 4 hypervascular liver lesions within right hepatic lobe, largest measuring 5.0 x 3.6 cm.    - Moderate atherosclerotic calcifications throughout abdominal vessels and renovascular calcifications. Patent fem-fem bypass. Multifocal arterial stenosis in femoral arteries.     02/27/17 CT chest, angiogram   - Did not show evidence of PE.   - Redemonstration of airspace and nodular consolidation, most prominent in LUL and lingula, bilateral mediastinal and lymphadenopathy.   - Interval increase in moderate left pleural effusion. Multiple pleural nodules. Interval increase in small pericardial effusion.   ???  Pertinent Pathology:   02/26/17 BAL    - Cytology was negative for malignant cells. Flow cytometry did not show evidence of lymphoproliferative disease.   ???  03/01/17 Biopsy of left liver lesion   - Metastatic poorly differentiated neuroendocrine carcinoma. Synaptophysin/chromogranin positive. TTF-1 patchy positive. PSA negative. Ki67 > 90%. Flow cytometry was limited due to low quantity of recoverable cells, but no discrete monotypic B-cell population seen.       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 Palliative chemotherapy with carbo/etoposide was started on 03/05/17.  ???  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 The goal of therapy is palliative. We will see how he does with induction and hope that his respiratory status will improve such that he can be extubated. Further treatment will be discussed pending possible response and clinical course. I discussed that there are significant risks associated with chemotherapy, but that given his dire clinical situation,  the potential benefit outweighs the risk. Risks include but are not limited to tumor lysis syndrome, leading to renal failure with potential need for initiation of dialysis, worsening of fluid overload that may exacerbate respiratory status, and neutropenic sepsis.   ???  - Carboplatin IV AUC of 4 once D1 (dose reduced due to critical illness) --> done  - Etoposide IV 80 mg/m2 D1-D3 (dose reduced due to critical illness and AKI) --> anticipate finishing on 1/9  - G-CSF following completion of chemotherapy  ???  #Risk for tumor lysis syndrome  - At high-risk due to burden of disease, chemosensitivity of tumor and high Ki67.  - Continue allopurinol, but if renal function continues to worsen would decrease to 300 mg daily  - Can decrease frequency of TLS labs to q12H, but if want to monitor electrolytes for other reasons would certainly defer to primary team.  - Deferring management of IVFs and diuresis to primary team, but would recommend supporting sufficient urine output and close monitoring of renal function.  ???  #Atrial fibrillation, on chronic anticoagulation  #DVT prophylaxis, high-risk in setting of immobilization and malignancy  - Would stop LMWH due to low GFR/AKI and switch to alternative anticoagulant.     Patient discussed with attending Dr. Clifton James. Please feel free to page [phone] with questions/concerns.  ???  Nydia Bouton  Hematology/Oncology Fellow  [phone]  ???

## 2017-03-07 NOTE — Progress Notes
.  MICU PROGRESS NOTE    PATIENT: Ryan Shepard       MRN: [id number]      DATE OF SERVICE: 03/07/2017         HOSPITAL DAY: 12  PRIMARY CARE PROVIDER: Sheppard Coil, MD  ATTENDING: Eliane Decree., MD, PhD    DPOA: [place] daughter [phone]    ID: Ryan Shepard???is a 77 y.o.???male???with a history of COPD (on 4LNC), HFpEF, afib, HTN, DM2, PAD s/p femoral graft, prior prostate CA who was admitted to the ICU for hypoxemic respiratory failure requiring BiPAP.    -- Lines/Tubes --   ETT (size 8.86mm), CVC 3-lumen L fem, A-line, NGT  -- Drips --  Fent, Precedex, propofol, levophed  -- Antimicrobials --  Levofloxacin, cefepime  ???    IE/SUBJECTIVE:   IE:  - Had episode of desats yest to low 80s, hypotension to MAPs low 50s and tachycardia. CXR stable from prior. Increase lasix gtt to 20 then 30 overnight given c/f fluid overload  - Continues on chemo regimen day 3, course shortened d/t AKI  - Continues on albumin/lasix gtt. Lasix gtt maxed to rate of 30. Net -250  - P/F ratio worsening 84 from 120 yest  .  Subjective:   - Intubated and sedated. Unable to assess    OBJECTIVE:   VITALS:  Temp:  [36.8 ???C (98.2 ???F)-37.5 ???C (99.5 ???F)] 37.4 ???C (99.3 ???F)  Heart Rate:  [65-112] 74  Resp:  [21-34] 26  Arterial Line BP (mmHg): (89-128)/(44-60) 121/54  MAP:  [59 mmHg-81 mmHg] 73 mmHg  FiO2 (%):  [50 %-100 %] 70 %  SpO2:  [85 %-97 %] 88 %  01/08 0701 - 01/09 0700  In: 3311.2 [I.V.:1726.2]  Out: 3320 [Urine:3320]     Wt Readings from Last 1 Encounters:   03/06/17 (!) 103.8 kg (228 lb 13.4 oz)     Recent Labs      03/07/17   0407  03/06/17   2355  03/06/17   2002  03/06/17   1647   GLUCOSEPOC  155*  149*  119*  109*   }  Oxygen Therapy  SpO2: (!) 88 %  O2 Device: ETT, Mechanical Ventilator  FiO2 (%): 70 %  O2 Flow Rate (L/min): 40 L/min     Vent Mode: VC  FiO2 (%):  [50 %-100 %] 70 %  Rate:  [26] 26  VT (mL):  [480 mL] 480 mL  PEEP (setting):  [10 cmH2O-12 cmH2O] 12 cmH2O    PHYSICAL EXAM:  Unchanged from yest General - Intubated/Sedated  Neuro - Sedated, +gag, pupils reactive  Cardiovascular - RRR no m/r/g, unable to assess JVP d/t girth, 2+ radial pulses b/l  Lungs - No audible cuff leak, no wheezing anteriorly, mechanical breath sounds  Skin - No visible rashes or lesions  Abdomen - Central obesity, soft, NT/ND  Extremeties - Warm and well-perfused. 1+ LE edema    LABORATORY DATA:   BMP  Recent Labs      03/07/17   0408  03/06/17   2000  03/06/17   1648  03/06/17   1153  03/06/17   0358   NA  145  145   --   145  144   K  3.4*  4.1   --   3.6  4.0   CL  100  101   --   99  101   CO2  24  24   --  25  25   BUN  67*  60*   --   53*  49*   CREAT  1.72*  1.42*   --   1.27  1.36*   CALCIUM  9.5  10.0   --   9.8  8.9   MG  1.9   --   2.0*   --   2.1*   PHOS  4.5*  4.6*   --   4.4  3.5     CBC  Recent Labs      03/07/17   0408  03/06/17   0358  03/05/17   0340   WBC  8.75  8.21  7.18   HGB  10.5*  9.9*  9.6*   HCT  33.4*  31.0*  29.7*   MCV  92.0  93.1  91.4   PLT  184  152  156     Coags  No results for input(s): INR, PT, APTT in the last 72 hours.  Blood gas  Recent Labs      03/07/17   0408   PHART  7.36*   PCO2ART  45*   PO2ART  59*   BICARBART  25.2   BEART  0       Micro:    Microbiology:   12/31 BAL cx - neg or NGTD  12/28 Resp Cx Probable C.albicans  12/28 Blood Cx NTD  12/28 RVP neg  12/18 Resp Cx Neg (C albicans)  12/13 BAL Cx +mycobacteria  12/13 respiratory culture: candida, therwise negative so far; AFB smear negative x 2 (AFB cultures NTD)  12/13 legionella culture neg  Aspergillus Ag EIA - ???<0.50  RVP panel negative  Aspergillus fumigatus (M3) IgE - NEG   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  HIV - neg  Legionella Urinary Ag - neg  Chlamydia/gonorrhea PCR - neg  RPR nonreactive IMAGING:   CXR (1/10):    ASSESSMENT & PLAN:   Ryan Shepard???is a 77 y.o.???male???with a history of COPD (on 3LNC), HFpEF, afib, HTN, DM2, PAD s/p femoral graft, prior prostate [place] who was admitted for hypoxemic respiratory failure requiring intubation.    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 CT head negative for brain mets.  - Heme-onc consulted, appreciate recs.   - Family meeting with wife (DPOA), daughter on phone, primary ICU team and Heme-Onc on 1/7 PM. Discussed benefits/adverse effects of chemotherapy and possibility that this may help decr tumor burden in lung as possible bridge to extubation. Family agreed to initiation.  - Cont chemo (caroplatin/etoposide) initiated 1/7 PM  - Cont Allopurinol 300mg  BID, can decr dose if renal function deteriorates  - F/u G6PD testing in case rasburicase is needed  - TLS labs q12 (BMP, Phos, uric acid, LDH)  - SQ therapeutic lovenox in setting of malignancy, consider switching to SQ heparin in setting of worsening AKI    RESP:  #Acute on chronic hypoxemic respiratory failure  Admitted from clinic requiring BiPAP in setting of decreased arterial PaO2 and desats to low 80s. Recently admitted w/ bronch showing chronic colonization with aspergillus. Has  features c/f malignancy on imaging. Respiratory status continues to worsen with worsened xray. S/p upsized ETT on 1/1 w/ resolution of cuff leak. Initial c/f PE, CTA read no PE so d/c'd heparin gtt, LE dopplers negative for DVT.   - ARDS NET protocol  - Serial ABGs   - Malignancy tx, as below  - Antibiotics, as below    #Chronic COPD  Increased sputum production x 2 months, slight wheezing and recently treated with steroids while hospitalized. Current presentation less c/w COPD exacerbation.  - Duonebs q6h  - Inhaled Flovent BID - Empiric abx, as below  ???  CV:  #Hypovolemic shock in setting of sedation, improving  Patient requires significant sedation d/t air leak on cuff of ETT, patient changing position can increase air leak. Hypotension requiring pressors in setting of sedation but elevated lactate and signs of end-organ damage including rising Cr. Patient has leukocytosis and on empiric abx, no other signs of infection.  - A-line for accurate BP monitoring  - Pressors: restarted on levophed  - S/p hydrocortisone and fludrocortisone (1/2-1/7)  - Trend lactate  - Goal MAPs > 65 on A-line    #HFpEF  Home dose lasix PO 40mg  BID. TTE shows hyperdynamic EF >75% and elevated PASP to .  - D/c albumin  - Continue lasix gtt and titrate to goal net -1L over 24 hrs  - BID lytes, daily weights, strict I&O's     #Afib  Rate-controlled. CHADS2VaSC 3-4  - Hold home carvedilol 25 mg BID  - Hold home apixaban 5 mg BID  - SQ lovenox in setting of malignancy, consider switching to SQ heparin in setting of worsening renal function    NEURO:  #Intubated and sedated 2/2 hypoxemic respiratory failure  - Cont sedation with fentanyl/precedex/propofol  - Attempt sedation holiday as tolerated  - Repeat triglycerides q72h while on propofol    ID:  #Leukocytosis, improving  Afebrile. Increased sputum production x 2 months otherwise no localizing signs of infection. Did receive course of methylprednisolone 12/18-12/23 on recent hospitalization. 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 +Mycobacteria 12/13 BAL resulted on 03/01/17, not nocardia as previously thought.  - ID signed off, appreciate recs  - S/p IV Vanc  - S/p Azithromycin  - F/u urine histoplasma Ag (sendout lab), hold antifungal tx at this time  - S/p IV Zosyn  - S/p IV Bactrim 500mg  q8hr given that patient does not have pulmonary nocardia  - Cont levofloxacin for atypical coverage/GN coverage (1/2- )  - Cont empiric IV cefepime for GP coverage (1/6- )  ??????  CHRONIC  #HTN - Hold home BP meds in setting of shock: nifedipine 30mg  daily, losartan 100 mg daily, carvedilol 25 mg BID  ???  #Chronic???pain   Localized to lower back and lower extremities  - Hold home Norco 5-325 q6h PRN while sedated  ???  #GERD chronic/stable  - Pantoprazole PO 40 mg daily 12/31  ???  #Depression/Anxiety  Mood stable, no SI, no anxiety  - Hold home buspirone 15 mg BID, duloxetine DR 60 while sedated  ???  #PAD  s/p femoral-femoral graft 2009.  - Hole home clopidogrel 75 mg daily  ???  #HLD chronic/stable  - Hold home Pravastatin 40 mg qhs  ???  #DM2 with associated diabetic peripheral neuropathy  - ISS#3 now that TPN started  - Hole home gabapentin 800 mg TID   ???  F - Hold TFs in setting of high residuals. q6h refeeding labs, supp K/Mg/Phos PRN  A - Sedated  S - Fent/precedex/propofol  T -  SQ lovenox (therapeutic)  H - 30 degrees  U - PPI BID  G - ISS#3, Accuchecks q4h  B - Senna qhs, suppository, miralax  I - ETT 8.7mm (upsided 1/1), PIV 22g R hand (12/28), CVC 3-Lumen L fem (1/1), A-line (12/31), OGT (12/31), PIV x 1 L hand    Disposition: ICU level care required for hypoxemic respiratory failure requiring intubation  ???  Code Status: Full Code  Contact:  Primary Emergency ContactPurvis, [place], Home Phone: [phone]    DPOA is daughter Jakaden Ouzts daughter [phone]      DWA Dr. Micah Noel, PGY-1  [phone]

## 2017-03-07 NOTE — Other
Patients Clinical Goal:   Clinical Goal(s) for the Shift: MAP>65, Spo2>88<92% ARDS NET, maintain comfort, keep pt sedated RAS-2-3, moitor TF titrate per protocol, labs, CHG. chemo therapy.   Identify possible barriers to advancing the care plan:   Stability of the patient: Moderately Unstable - medium risk of patient condition declining or worsening    End of Shift Summary: MAP>65 on NE 0.03 mcg/kg/min, SpO2>88<92% ARDS NET, pt is comfortable and sedated RAS-2-3, TF was stopped due to high residuals in q4 check during the all shift and still remain off at 6am, labs sent results reviewed and notified the MD, electrolytes replaced, Chemo was administered with chemo RN, CHG provided.

## 2017-03-07 NOTE — Progress Notes
Department of Care Coordination and Clinical Social Work  Brief Psychosocial Assessment    Date seen: 03/06/2017    Date referred: 03/06/17    Referral Source: RN     Reason for Referral: Forms/Letters       Advance Directive? Yes  Temporary or Enduring? 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 Cancer (HCC/RAF)    ??? Diabetes (HCC/RAF)    ??? GERD (gastroesophageal reflux disease)    ??? Hypercholesteremia    ??? Hypertension    ??? PAD (peripheral artery disease) (HCC/RAF)    ??? 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 Prostate disease    ??? Scoliosis     adolesent   ??? Vascular disease     Past Surgical History:   Procedure Laterality Date   ??? BACK SURGERY     ??? BILATERAL FEMORAL ARTERY EXPLORATION  2009   ??? BLADDER SURGERY  1999   ??? PROSTATE SURGERY  2000          Patient/Family Concerns: Pt's daughter has an Proofreader (AD) which was executed in 2014 and would like to make a record of it.     Assessment: Pt is  A 77 year old [place] American male with a history of CAncer, PAD, partial amputation of the foot, bladder surgery and other medical problems.   See previous Sw note.  CSW responded to a request from RN Ryan Shepard to meet with the Pt's daughter at MICU bedside.  She has a copy of an Enduring AD that the family is requesting be scanned to the electronic record.  CSW reviewed the document and consulted with the Advance Care Planning SW.  The document appears to be valid and will take precedence over previous temporary ADs on file that indicate that the Pt's spouse is the agent.  The Enduring AD indicates that Ryan Shepard the daughter is the agent. According to MICU Resident Dr. Krista Shepard and Intern Dr. Patrecia Shepard, the Pt's wife and daughter are in agreement regarding the Pt's Goals of care.  They both agreed to the chemotherapy treatment now being administered per MD report.     CSW brought the document to the Admissions office for scanning and returned the document to the family members.  CSW confirmed that Ryan Shepard had received her meal voucher for the day. No other SW needs were presented.     Intervention/Plan/Recommendations: CSW reviewed the Pt's existing Enduring AD and consulted with ACP SW.  CSW consulted with the RN and MICU MDs/  CSW met with the Pt's daughter, introduced self/explained role and provided business card.   CSW facilitated scanning of the document and returned the original to the family.  CSW will continue to follow.  CSW plans to provide information regarding counseling from [facility] in [address].      Ryan Gambles Danai Gotto, LCSW,  03/06/2017

## 2017-03-07 NOTE — Nursing Note
0330: SPO2 fluctuating between 87-88%. Suctioned patient, but had no secretions and did not improve SPO2. RT paged.    8185Marland Kitchen Estill Bamberg at bedside. He wants to wait for ABG at 0400 before he makes ventilator changes since extremities are cool. Applied warm blanket and heat pack around pulse oximeter/finger.

## 2017-03-08 ENCOUNTER — Ambulatory Visit: Payer: MEDICARE

## 2017-03-08 LAB — Bacterial Culture-Gm Stain

## 2017-03-08 LAB — Uric Acid
URIC ACID: 4.6 mg/dL (ref 3.4–8.8)
URIC ACID: 4.6 mg/dL (ref 3.4–8.8)

## 2017-03-08 LAB — Blood Culture Detection
BLOOD CULTURE FINAL STATUS: NEGATIVE
BLOOD CULTURE FINAL STATUS: NEGATIVE
BLOOD CULTURE FINAL STATUS: NEGATIVE
BLOOD CULTURE FINAL STATUS: NEGATIVE

## 2017-03-08 LAB — Blood Gases, arterial
ABG INSPIRED O2: 70 mmHg (ref 38–42)
ABG INSPIRED O2: 80 mmol/L (ref 22.0–26.0)
BASE EXCESS: -3 mmol/L — ABNORMAL LOW (ref ?–2)
BASE EXCESS: -4 mmol/L — ABNORMAL LOW (ref ?–2)
PH: 7.34 — ABNORMAL LOW (ref 7.37–7.41)
PO2: 69 mmHg — ABNORMAL LOW (ref 98–118)

## 2017-03-08 LAB — Blood Lactate: BLOOD LACTATE: 30 mg/dL — ABNORMAL HIGH (ref 5–25)

## 2017-03-08 LAB — CBC: MEAN PLATELET VOLUME: 8.8 fL — ABNORMAL LOW (ref 9.3–13.0)

## 2017-03-08 LAB — Magnesium
MAGNESIUM: 2.4 meq/L — ABNORMAL HIGH (ref 1.4–1.9)
MAGNESIUM: 2.4 meq/L — ABNORMAL HIGH (ref 1.4–1.9)

## 2017-03-08 LAB — Basic Metabolic Panel: CREATININE: 2.08 mg/dL — ABNORMAL HIGH (ref 0.60–1.30)

## 2017-03-08 LAB — Differential Automated: ABSOLUTE EOS COUNT: 0.11 10*3/uL (ref 0.00–0.50)

## 2017-03-08 LAB — Phosphorus
PHOSPHORUS: 5.2 mg/dL — ABNORMAL HIGH (ref 2.3–4.4)
PHOSPHORUS: 5.2 mg/dL — ABNORMAL HIGH (ref 2.3–4.4)

## 2017-03-08 LAB — UA,Microscopic: WBCS: 2 {cells}/uL (ref 0–22)

## 2017-03-08 LAB — Glucose,POC
GLUCOSE,POC: 128 mg/dL — ABNORMAL HIGH (ref 65–99)
GLUCOSE,POC: 119 mg/dL — ABNORMAL HIGH (ref 65–99)
GLUCOSE,POC: 149 mg/dL — ABNORMAL HIGH (ref 65–99)
GLUCOSE,POC: 148 mg/dL — ABNORMAL HIGH (ref 65–99)
GLUCOSE,POC: 147 mg/dL — ABNORMAL HIGH (ref 65–99)
GLUCOSE,POC: 133 mg/dL — ABNORMAL HIGH (ref 65–99)

## 2017-03-08 LAB — Sepsis Lactate Protocol: BLOOD LACTATE: 35 mg/dL — ABNORMAL HIGH (ref 5–25)

## 2017-03-08 LAB — Sodium,Random,Ur: SODIUM,RANDOM URINE: 84 mmol/L

## 2017-03-08 LAB — Sepsis Lactate Repeat: BLOOD LACTATE: 35 mg/dL — ABNORMAL HIGH (ref 5–25)

## 2017-03-08 LAB — Adenosine Deaminase, Pleural Fluid: ADENOSINE DEAMINASE, PLEURAL FLUID: <1.6 U/L (ref 0.0–9.4)

## 2017-03-08 LAB — Creatinine,Random Urine: CREATININE,RANDOM URINE: 19.4 mg/dL

## 2017-03-08 LAB — UA,Dipstick: LEUKOCYTE ESTERASE: NEGATIVE (ref 1.005–1.030)

## 2017-03-08 LAB — Urea Nitrogen,Random Urine: UREA NITROGEN,RANDOM URINE: 210 mg/dL

## 2017-03-08 LAB — Lactate Dehydrogenase
LACTATE DEHYDROGENASE: 410 U/L — ABNORMAL HIGH (ref 125–256)
LACTATE DEHYDROGENASE: 404 U/L — ABNORMAL HIGH (ref 125–256)

## 2017-03-08 LAB — Glucose-6-Phosphate Dehydrogenase: GLUCOSE-6-PHOSPHATE DEHYDROGENASE: 11.9 U/g{Hb} (ref 9.9–16.6)

## 2017-03-08 LAB — Hepatic Funct Panel: ASPARTATE AMINOTRANSFERASE: 22 U/L (ref 13–47)

## 2017-03-08 MED ADMIN — METOCLOPRAMIDE HCL 10 MG/10ML PO SOLN: 5 mg | ORAL | @ 01:00:00 | Stop: 2017-03-07

## 2017-03-08 MED ADMIN — PROPOFOL INFUSION 10 MG/ML: 7755 ug/min | INTRAVENOUS | @ 14:00:00 | Stop: 2017-03-14 | NDC 63323026965

## 2017-03-08 MED ADMIN — PANTOPRAZOLE SODIUM 40 MG IV SOLR: 40 mg | INTRAVENOUS | @ 08:00:00 | Stop: 2017-03-13 | NDC 55150020210

## 2017-03-08 MED ADMIN — PROPOFOL INFUSION 10 MG/ML: 7755 ug/min | INTRAVENOUS | @ 17:00:00 | Stop: 2017-03-14 | NDC 63323026965

## 2017-03-08 MED ADMIN — ONDANSETRON HCL 4 MG/2ML IJ SOLN: 8 mg | INTRAVENOUS | @ 16:00:00 | Stop: 2017-03-09 | NDC 00641607825

## 2017-03-08 MED ADMIN — DEXMEDETOMIDINE 4 MCG/ML DRIP: 20.68 ug/h | INTRAVENOUS | @ 06:00:00 | Stop: 2017-03-11 | NDC 55150020902

## 2017-03-08 MED ADMIN — ONDANSETRON HCL 4 MG/2ML IJ SOLN: 8 mg | INTRAVENOUS | @ 08:00:00 | Stop: 2017-03-09 | NDC 00641607825

## 2017-03-08 MED ADMIN — PROPOFOL INFUSION 10 MG/ML: 7755 ug/min | INTRAVENOUS | @ 23:00:00 | Stop: 2017-03-14 | NDC 63323026965

## 2017-03-08 MED ADMIN — CEFEPIME 1 GM/50 ML RTU: 1 g | INTRAVENOUS | @ 16:00:00 | Stop: 2017-03-08 | NDC 00338130141

## 2017-03-08 MED ADMIN — PROPOFOL INFUSION 10 MG/ML: 7755 ug/min | INTRAVENOUS | @ 11:00:00 | Stop: 2017-03-14

## 2017-03-08 MED ADMIN — INSULIN ASPART 100 UNIT/ML SC SOPN: 3 mL | SUBCUTANEOUS | @ 08:00:00 | Stop: 2017-03-10 | NDC 00169633910

## 2017-03-08 MED ADMIN — FUROSEMIDE 10 MG/ML DRIP (STRAIGHT DRUG): 30 mg/h | INTRAVENOUS | @ 05:00:00 | Stop: 2017-03-09 | NDC 36000028425

## 2017-03-08 MED ADMIN — POTASSIUM CHLORIDE 20 MEQ/50ML RTU: 20 meq | INTRAVENOUS | @ 13:00:00 | Stop: 2017-03-08 | NDC 00338070341

## 2017-03-08 MED ADMIN — CHLORHEXIDINE GLUCONATE 0.12 % MT SOLN: 10 mL | OROMUCOSAL | @ 17:00:00 | Stop: 2017-03-13 | NDC 52376002110

## 2017-03-08 MED ADMIN — NOREPINEPHRINE 8, 16, 32 MG/250 ML DRIP: 10.34103.4 ug/min | INTRAVENOUS | @ 22:00:00 | Stop: 2017-03-10

## 2017-03-08 MED ADMIN — THIAMINE HCL 100 MG PO TABS: 100 mg | ORAL | @ 16:00:00 | Stop: 2017-03-13

## 2017-03-08 MED ADMIN — PROPOFOL INFUSION 10 MG/ML: 7755 ug/min | INTRAVENOUS | @ 20:00:00 | Stop: 2017-03-14 | NDC 63323026965

## 2017-03-08 MED ADMIN — ALLOPURINOL 300 MG PO TABS: 300 mg | ORAL | @ 16:00:00 | Stop: 2017-03-10 | NDC 51079020620

## 2017-03-08 MED ADMIN — INSULIN ASPART 100 UNIT/ML SC SOPN: 3 mL | SUBCUTANEOUS | @ 16:00:00 | Stop: 2017-03-10 | NDC 00169633910

## 2017-03-08 MED ADMIN — METOCLOPRAMIDE HCL 10 MG/10ML PO SOLN: 5 mg | ORAL | @ 01:00:00 | Stop: 2017-03-08

## 2017-03-08 MED ADMIN — POLYETHYLENE GLYCOL 3350 17 G PO PACK: 34 g | ORAL | @ 05:00:00 | Stop: 2017-03-13 | NDC 68084043098

## 2017-03-08 MED ADMIN — PROPOFOL INFUSION 10 MG/ML: 7755 ug/min | INTRAVENOUS | @ 21:00:00 | Stop: 2017-03-14 | NDC 63323026965

## 2017-03-08 MED ADMIN — FENTANYL 2.5 MG/100 ML NS DRIP: 50 ug/h | INTRAVENOUS | @ 08:00:00 | Stop: 2017-03-13 | NDC 00409909461

## 2017-03-08 MED ADMIN — NOREPINEPHRINE 8, 16, 32 MG/250 ML DRIP: 10.34103.4 ug/min | INTRAVENOUS | @ 05:00:00 | Stop: 2017-03-10 | NDC 36000016210

## 2017-03-08 MED ADMIN — LIDOCAINE HCL (PF) 1 % IJ SOLN: 30 mL | ENDOTRACHEOPULMONARY | @ 21:00:00 | Stop: 2017-03-08 | NDC 00409427902

## 2017-03-08 MED ADMIN — ONDANSETRON HCL 4 MG/2ML IJ SOLN: 8 mg | INTRAVENOUS | @ 01:00:00 | Stop: 2017-03-09 | NDC 00641607825

## 2017-03-08 MED ADMIN — CEFEPIME 1 GM/50 ML RTU: 1 g | INTRAVENOUS | @ 01:00:00 | Stop: 2017-03-08 | NDC 00338130141

## 2017-03-08 MED ADMIN — FENTANYL 2.5 MG/100 ML NS DRIP: 50 ug/h | INTRAVENOUS | @ 20:00:00 | Stop: 2017-03-13 | NDC 00409909461

## 2017-03-08 MED ADMIN — DEXMEDETOMIDINE 4 MCG/ML DRIP: 20.68 ug/h | INTRAVENOUS | @ 04:00:00 | Stop: 2017-03-11

## 2017-03-08 MED ADMIN — NOREPINEPHRINE 8, 16, 32 MG/250 ML DRIP: 10.34103.4 ug/min | INTRAVENOUS | @ 01:00:00 | Stop: 2017-03-10

## 2017-03-08 MED ADMIN — CEFEPIME 1 GM/50 ML RTU: 1 g | INTRAVENOUS | @ 08:00:00 | Stop: 2017-03-08 | NDC 00338130141

## 2017-03-08 MED ADMIN — ALBUMIN HUMAN 25 % IV SOLN: 25 g | INTRAVENOUS | @ 13:00:00 | Stop: 2017-03-12 | NDC 68516521601

## 2017-03-08 MED ADMIN — METOCLOPRAMIDE HCL 10 MG/10ML PO SOLN: 5 mg | ORAL | @ 05:00:00 | Stop: 2017-03-08

## 2017-03-08 MED ADMIN — VANCOMYCIN 250 ML IVPB: 1.25 g | INTRAVENOUS | @ 05:00:00 | Stop: 2017-03-08 | NDC 47781059791

## 2017-03-08 MED ADMIN — ALBUMIN HUMAN 25 % IV SOLN: 25 g | INTRAVENOUS | @ 20:00:00 | Stop: 2017-03-12 | NDC 68516521601

## 2017-03-08 MED ADMIN — INSULIN ASPART 100 UNIT/ML SC SOPN: 3 mL | SUBCUTANEOUS | @ 12:00:00 | Stop: 2017-03-10 | NDC 00169633910

## 2017-03-08 MED ADMIN — ALBUMIN HUMAN 25 % IV SOLN: 25 g | INTRAVENOUS | @ 08:00:00 | Stop: 2017-03-12 | NDC 68516521601

## 2017-03-08 MED ADMIN — FUROSEMIDE 10 MG/ML DRIP (STRAIGHT DRUG): 30 mg/h | INTRAVENOUS | @ 11:00:00 | Stop: 2017-03-09

## 2017-03-08 MED ADMIN — SENNOSIDES 8.6 MG PO TABS: 2 | ORAL | @ 16:00:00 | Stop: 2017-03-11 | NDC 00904652261

## 2017-03-08 MED ADMIN — DEXMEDETOMIDINE 4 MCG/ML DRIP: 20.68 ug/h | INTRAVENOUS | @ 16:00:00 | Stop: 2017-03-11 | NDC 55150020902

## 2017-03-08 MED ADMIN — ACETAMINOPHEN 325 MG PO TABS: 650 mg | ORAL | @ 03:00:00 | Stop: 2017-03-13 | NDC 00904198261

## 2017-03-08 MED ADMIN — POLYETHYLENE GLYCOL 3350 17 G PO PACK: 34 g | ORAL | @ 16:00:00 | Stop: 2017-03-13 | NDC 68084043098

## 2017-03-08 MED ADMIN — ETOPOSIDE CHEMO INFUSION: 172 mg | INTRAVENOUS | @ 07:00:00 | Stop: 2017-03-08 | NDC 16729011431

## 2017-03-08 MED ADMIN — CHLORHEXIDINE GLUCONATE 0.12 % MT SOLN: 10 mL | OROMUCOSAL | @ 05:00:00 | Stop: 2017-03-13 | NDC 52376002110

## 2017-03-08 MED ADMIN — METOCLOPRAMIDE HCL 10 MG/10ML PO SOLN: 5 mg | ORAL | @ 13:00:00 | Stop: 2017-03-08

## 2017-03-08 MED ADMIN — FUROSEMIDE 10 MG/ML DRIP (STRAIGHT DRUG): 30 mg/h | INTRAVENOUS | @ 20:00:00 | Stop: 2017-03-09 | NDC 36000028425

## 2017-03-08 MED ADMIN — ALLOPURINOL 300 MG PO TABS: 300 mg | ORAL | @ 05:00:00 | Stop: 2017-03-10 | NDC 51079020620

## 2017-03-08 MED ADMIN — METOCLOPRAMIDE HCL 5 MG/ML IJ SOLN: 5 mg | INTRAVENOUS | @ 20:00:00 | Stop: 2017-03-13 | NDC 00409341401

## 2017-03-08 MED ADMIN — ALBUMIN HUMAN 25 % IV SOLN: 25 g | INTRAVENOUS | @ 05:00:00 | Stop: 2017-03-12 | NDC 68516521601

## 2017-03-08 MED ADMIN — SENNOSIDES 8.6 MG PO TABS: 2 | ORAL | @ 05:00:00 | Stop: 2017-03-11 | NDC 00904652261

## 2017-03-08 NOTE — Progress Notes
Pharmaceutical Services ??? Vancomycin Dosing (Initial)    Patient Name: Ryan Shepard  MRN: [id number]  Age: 77 y.o.  Sex: male    Vancomycin per Pharmacy Consult Order     Start     Ordered    03/07/17 1927  vancomycin per pharmacy  Per Protocol     Question Answer Comment   Indication Pneumonia    Goal Trough Serum Level 15-20 mcg/mL    Has patient received a dose of this drug within the past 72 hours? No        03/07/17 1927        Vital Signs/Other Objective Data  Allergies: Patient has no known allergies.    BP 99/48  ~ Pulse 90  ~ Temp 37.9 ???C (100.2 ???F)  ~ Resp 19  ~ Ht 1.854 m (6' 0.99'')  ~ Wt (!) 103.8 kg (228 lb 13.4 oz)  ~ SpO2 (!) 89%  ~ BMI 30.20 kg/m???     Intake/Output Summary (Last 24 hours) at 03/07/17 2014  Last data filed at 03/07/17 1900   Gross per 24 hour   Intake          2572.56 ml   Output             2960 ml   Net          -387.44 ml       Medications  Med Administrations and Associated Flowsheet Values (last 72 hours)  Vancomycin administration    None        Labs (most recent)  White Blood Cell Count   Date Value Ref Range Status   03/07/2017 8.75 4.16 - 9.95 x10E3/uL Final   01/25/2008 7.82 3.28 - 9.29 x10E3/uL      Urea Nitrogen   Date Value Ref Range Status   03/07/2017 73 (H) 7 - 22 mg/dL Final   47/82/9562 33 (H) 7 - 23 mg/dL Final     Creatinine   Date Value Ref Range Status   03/07/2017 1.97 (H) 0.60 - 1.30 mg/dL Final   13/09/6576 0.9 0.5 - 1.3 mg/dL      No results found for: Paulo Fruit  Assessment  Indication Pneumonia  Goal trough level 15-20 mcg/mL  Estimated Creatinine Clearance ~63ml/min  This patient is receiving dialysis: No    Plan  Ryan Shepard is a 77 y.o. male who has been referred to pharmacy for vancomycin dosing. The indication for vancomycin is Pneumonia.  Start vancomycin 1.25gm mg IV q24h. Pharmacy will continue to monitor the patient's clinical progress. The next vancomycin trough is scheduled for 1/11 at 2000. Tia Alert, PharmD, 03/07/2017, 8:14 PM

## 2017-03-08 NOTE — Nursing Note
1900 Received report.   2000 Assessments done. Teaching is done. Medications given. Labs sent.   2100 lab results reviewed.   2200 CHG is done. Chemo given.   2300 lasix gtt titrated per Md order.   0000 Assessments done. Teaching is done. Medications given. Labs sent. TF residuals still >200.  0100 lab results reviewed and paged to RT, vent setting per ARDS NET.  0400 Assessments done. Teaching is done. Medications given. Labs sent.  0500 lab results reviewed and paged to RT and MD. ECG is done  0600 electrolytes ordered and started to replace the deficiency.

## 2017-03-08 NOTE — Consults
Patient remains in the MICU on the ventilator with gtts.  S/P chemotherapy tx.  Plan for bronchoscopy today.

## 2017-03-08 NOTE — Progress Notes
Critical Care Attending Note:    I have seen and examined and personally managed this patient who remains critically ill and high risk for clinical deterioration related to acute hypoxic resp failure in setting of underlying COPD, multifocal PNA, and new dx of small cell lung CA with concurrent malignant effusion.    Remains quite hypoxic and s/p recent chemo in hopes of lessening tumor burden. Broadening abxs and will consider bronch for mucus plugging if hypoxia persists. Bedside US with only small reaccumulation of effusion. On anticoagulation for afib and diuresing as tolerated though limited by worsening renal function. Continuing protective ventilation with ARDS net protocol. Overall prognosis quite guarded and discussed with wife at bedside today who voiced understanding. Plan for aggressive therapy in meantime in hopes of getting some improvement with chemo.     Critical Care Time Personally Spent in Patient Care: 82 minutes.    Jonnette Nuon S. [name]

## 2017-03-08 NOTE — Other
Patients Clinical Goal:   Clinical Goal(s) for the Shift: Maintain MAP > 65. 2. Spo2 > 88% on ARDS NET Protocol. 3. Bowel Regimen. 4. Maintain Patient's Safety and Comfort.  Identify possible barriers to advancing the care plan:   Stability of the patient: Moderately Unstable - medium risk of patient condition declining or worsening    End of Shift Summary:   Levophed titrated to keep MAP > 65. Levo gtt currently infusing at 0.06 mcg/kg/min.  Patient tachycardic in the 120,s, low grade fever at 38.2. Cool measures applied, blood culture, lactate and urine culture obtained .  MICU Team notified and were at bedside and assessed patient. Tylenol also given.  Patient remains on ARDS NET protocol. Patient easily desaturates with any movement.  Currently on PEEP 14, Fio2 80%, Sats. In the mid 80's. Team and Respiratory Therapist made aware with ABG result.  Results for OMARION, MINNEHAN (MRN [id number]) as of 03/07/2017 19:18   Ref. Range 03/07/2017 18:16   pH Latest Ref Range: 7.37 - 7.41  7.32 (L)   pCO2 Latest Ref Range: 38 - 42 mm Hg 49 (H)   pO2 Latest Ref Range: 98 - 118 mm Hg 62 (L)   Bicarbonate Latest Ref Range: 22.0 - 26.0 mmol/L 24.2   Base Excess Latest Ref Range: -2 - 2 mmol/L -2   O2 Sat/Measured Latest Ref Range: >=95.0 % 88.0 (L)   Inspired O2 Unknown 80%     Plan for Bronchoscopy Tomorrow per. Dr. [name].  Bowel Regimen completed with effective result. Patient had large brown hard, soft loose stool.  Fentanyl, propofol and precedex gtt all infusing to maximize comfort.  Family of patient visiting and were updated by Dr. [name] about plan of care.  Patient's safety and comfort maximized.

## 2017-03-08 NOTE — Other
Patients Clinical Goal:   Clinical Goal(s) for the Shift: Keep SpO2>88<92% ARDS NET, MAP>65, miantain comfort keep pt sedated RAS-2-3, CHG, chemo, labs, teaching, update pt family.   Identify possible barriers to advancing the care plan:   Stability of the patient: Moderately Unstable - medium risk of patient condition declining or worsening    End of Shift Summary: SpO2>88<92% ARDS NET, MAP>65 on NE, pt is comfortable sedated RAS -2-3, CHG is done, chemo is done, labs sent, teaching is done. Family updated.

## 2017-03-09 ENCOUNTER — Ambulatory Visit: Payer: PRIVATE HEALTH INSURANCE

## 2017-03-09 ENCOUNTER — Ambulatory Visit: Payer: MEDICARE

## 2017-03-09 ENCOUNTER — Ambulatory Visit: Payer: MEDICAID

## 2017-03-09 LAB — UA,Dipstick: NITRITE: NEGATIVE (ref 5.0–8.0)

## 2017-03-09 LAB — Glucose,POC
GLUCOSE,POC: 113 mg/dL — ABNORMAL HIGH (ref 65–99)
GLUCOSE,POC: 109 mg/dL — ABNORMAL HIGH (ref 65–99)
GLUCOSE,POC: 142 mg/dL — ABNORMAL HIGH (ref 65–99)
GLUCOSE,POC: 157 mg/dL — ABNORMAL HIGH (ref 65–99)
GLUCOSE,POC: 139 mg/dL — ABNORMAL HIGH (ref 65–99)
GLUCOSE,POC: 136 mg/dL — ABNORMAL HIGH (ref 65–99)

## 2017-03-09 LAB — UA,Microscopic: WBCS HPF: 0 {cells}/[HPF] (ref 0–4)

## 2017-03-09 LAB — Basic Metabolic Panel
CHLORIDE: 100 mmol/L (ref 96–106)
SODIUM: 143 mmol/L (ref 135–146)
SODIUM: 144 mmol/L (ref 135–146)
TOTAL CO2: 22 mmol/L (ref 20–30)
TOTAL CO2: 17 mmol/L — ABNORMAL LOW (ref 20–30)
UREA NITROGEN: 86 mg/dL — ABNORMAL HIGH (ref 7–22)

## 2017-03-09 LAB — Fungal Stain

## 2017-03-09 LAB — Uric Acid
URIC ACID: 5.1 mg/dL (ref 3.4–8.8)
URIC ACID: 5.2 mg/dL (ref 3.4–8.8)

## 2017-03-09 LAB — Procalcitonin: PROCALCITONIN: 2.05 ug/L — ABNORMAL HIGH (ref <0.10)

## 2017-03-09 LAB — Blood Culture Detection
BLOOD CULTURE FINAL STATUS: NEGATIVE
BLOOD CULTURE FINAL STATUS: NEGATIVE
BLOOD CULTURE FINAL STATUS: NEGATIVE
BLOOD CULTURE FINAL STATUS: NEGATIVE

## 2017-03-09 LAB — Sodium,Random,Ur: SODIUM,RANDOM URINE: 37 mmol/L

## 2017-03-09 LAB — Differential Automated: MONOCYTE PERCENT, AUTO: 5.5 (ref 0.00–0.04)

## 2017-03-09 LAB — Phosphorus
PHOSPHORUS: 6.6 mg/dL — ABNORMAL HIGH (ref 2.3–4.4)
PHOSPHORUS: 6.8 mg/dL — ABNORMAL HIGH (ref 2.3–4.4)

## 2017-03-09 LAB — Troponin I: TROPONIN I: 0.04 ng/mL (ref <0.1)

## 2017-03-09 LAB — APTT
APTT: 40.3 s — ABNORMAL HIGH (ref 24.4–36.2)
APTT: 122.9 s — ABNORMAL HIGH (ref 24.4–36.2)
APTT: 65.1 s — ABNORMAL HIGH (ref 24.4–36.2)
APTT: 80.2 s — ABNORMAL HIGH (ref 24.4–36.2)

## 2017-03-09 LAB — Blood Gases, arterial
BASE EXCESS: -6 mmol/L — ABNORMAL LOW (ref ?–2)
O2 SATURATION/MEASURED: 93.2 — ABNORMAL LOW (ref >=95.0)
O2 SATURATION/MEASURED: 93.7 — ABNORMAL LOW (ref >=95.0)
PH: 7.3 — ABNORMAL LOW (ref 7.37–7.41)
PO2: 65 mmHg — ABNORMAL LOW (ref 98–118)
PO2: 100 mmHg (ref 98–118)
PO2: 54 mmHg — ABNORMAL LOW (ref 98–118)

## 2017-03-09 LAB — Vancomycin,random: VANCOMYCIN,RANDOM: 11.8 ug/mL

## 2017-03-09 LAB — Magnesium
MAGNESIUM: 2.4 meq/L — ABNORMAL HIGH (ref 1.4–1.9)
MAGNESIUM: 2.2 meq/L — ABNORMAL HIGH (ref 1.4–1.9)
MAGNESIUM: 2.2 meq/L — ABNORMAL HIGH (ref 1.4–1.9)

## 2017-03-09 LAB — Lactate Dehydrogenase
LACTATE DEHYDROGENASE: 463 U/L — ABNORMAL HIGH (ref 125–256)
LACTATE DEHYDROGENASE: 420 U/L — ABNORMAL HIGH (ref 125–256)

## 2017-03-09 LAB — Mycoplasma pneumoniae DNA PCR: MYCOPLASMA PNEUMONIAE DNA PCR: NOT DETECTED

## 2017-03-09 LAB — Pneumocystis Direct Detection: PNEUMOCYSTIS DIRECT DETECTION: NEGATIVE

## 2017-03-09 LAB — Calcium: CALCIUM: 9.4 mg/dL (ref 8.6–10.4)

## 2017-03-09 LAB — Hepatic Funct Panel: ALKALINE PHOSPHATASE: 49 U/L (ref 37–113)

## 2017-03-09 LAB — CBC: ABSOLUTE NUCLEATED RBC COUNT: 0 10*3/uL (ref 0.00–0.00)

## 2017-03-09 LAB — Urea Nitrogen,Random Urine: UREA NITROGEN,RANDOM URINE: 378 mg/dL

## 2017-03-09 LAB — Gram Stain Respiratory: GRAM STAIN (GENERAL): NONE SEEN

## 2017-03-09 LAB — FINE NEEDLE ASPIRATE

## 2017-03-09 LAB — B-Type Natriuretic Peptide: BNP: 213 pg/mL — ABNORMAL HIGH (ref <100)

## 2017-03-09 LAB — Fungal Culture Respiratory

## 2017-03-09 LAB — Bacterial Culture Respiratory

## 2017-03-09 LAB — Blood Lactate: BLOOD LACTATE: 20 mg/dL (ref 5–25)

## 2017-03-09 MED ADMIN — INSULIN ASPART 100 UNIT/ML SC SOPN: 3 mL | SUBCUTANEOUS | @ 20:00:00 | Stop: 2017-03-10 | NDC 00169633910

## 2017-03-09 MED ADMIN — NOREPINEPHRINE 8, 16, 32 MG/250 ML DRIP: 10.34103.4 ug/min | INTRAVENOUS | @ 04:00:00 | Stop: 2017-03-10 | NDC 36000016210

## 2017-03-09 MED ADMIN — ONCOLOGY MOUTHWASH: 30 mL | ORAL | @ 21:00:00 | Stop: 2017-03-13 | NDC 00067628212

## 2017-03-09 MED ADMIN — FUROSEMIDE 10 MG/ML DRIP (STRAIGHT DRUG): 50 mg/h | INTRAVENOUS | @ 20:00:00 | Stop: 2017-03-11 | NDC 36000028425

## 2017-03-09 MED ADMIN — HEPARIN 25,000 UNITS/250 ML 0.45% NACL RTU: 1250 [IU]/h | INTRAVENOUS | @ 01:00:00 | Stop: 2017-03-13 | NDC 00409765062

## 2017-03-09 MED ADMIN — ONDANSETRON HCL 4 MG/2ML IJ SOLN: 8 mg | INTRAVENOUS | @ 01:00:00 | Stop: 2017-03-09 | NDC 00641607825

## 2017-03-09 MED ADMIN — FENTANYL 2.5 MG/100 ML NS DRIP: 50 ug/h | INTRAVENOUS | @ 21:00:00 | Stop: 2017-03-13 | NDC 00409909461

## 2017-03-09 MED ADMIN — METOCLOPRAMIDE HCL 5 MG/ML IJ SOLN: 5 mg | INTRAVENOUS | @ 14:00:00 | Stop: 2017-03-13 | NDC 00409341401

## 2017-03-09 MED ADMIN — INSULIN ASPART 100 UNIT/ML SC SOPN: 3 mL | SUBCUTANEOUS | @ 12:00:00 | Stop: 2017-03-10 | NDC 00169633910

## 2017-03-09 MED ADMIN — ALLOPURINOL 300 MG PO TABS: 300 mg | ORAL | @ 04:00:00 | Stop: 2017-03-10 | NDC 51079020620

## 2017-03-09 MED ADMIN — CHLORHEXIDINE GLUCONATE 0.12 % MT SOLN: 10 mL | OROMUCOSAL | @ 04:00:00 | Stop: 2017-03-13 | NDC 52376002110

## 2017-03-09 MED ADMIN — HEPARIN 25,000 UNITS/250 ML 0.45% NACL RTU: 1250 [IU]/h | INTRAVENOUS | @ 07:00:00 | Stop: 2017-03-13

## 2017-03-09 MED ADMIN — POLYETHYLENE GLYCOL 3350 17 G PO PACK: 34 g | ORAL | @ 04:00:00 | Stop: 2017-03-13 | NDC 68084043098

## 2017-03-09 MED ADMIN — SENNOSIDES 8.6 MG PO TABS: 2 | ORAL | @ 04:00:00 | Stop: 2017-03-11 | NDC 00904652261

## 2017-03-09 MED ADMIN — DEXMEDETOMIDINE 4 MCG/ML DRIP: 20.68 ug/h | INTRAVENOUS | @ 14:00:00 | Stop: 2017-03-11

## 2017-03-09 MED ADMIN — VANCOMYCIN 750 MG/150 ML RTU: 750 mg | INTRAVENOUS | @ 04:00:00 | Stop: 2017-03-09 | NDC 00338358048

## 2017-03-09 MED ADMIN — HEPARIN SODIUM (PORCINE) 1000 UNIT/ML IJ SOLN: 7340 [IU] | INTRAVENOUS | @ 01:00:00 | Stop: 2017-03-09 | NDC 63323054011

## 2017-03-09 MED ADMIN — FILGRASTIM-SNDZ 480 MCG/0.8ML IJ SOSY: 480 ug | SUBCUTANEOUS | @ 22:00:00 | Stop: 2017-03-14 | NDC 61314032610

## 2017-03-09 MED ADMIN — METOCLOPRAMIDE HCL 5 MG/ML IJ SOLN: 5 mg | INTRAVENOUS | @ 04:00:00 | Stop: 2017-03-13 | NDC 00409341401

## 2017-03-09 MED ADMIN — ALBUMIN HUMAN 25 % IV SOLN: 25 g | INTRAVENOUS | @ 08:00:00 | Stop: 2017-03-12 | NDC 68516521601

## 2017-03-09 MED ADMIN — SENNOSIDES 8.6 MG PO TABS: 2 | ORAL | @ 17:00:00 | Stop: 2017-03-11 | NDC 00904652261

## 2017-03-09 MED ADMIN — PROPOFOL INFUSION 10 MG/ML: 7755 ug/min | INTRAVENOUS | @ 03:00:00 | Stop: 2017-03-14

## 2017-03-09 MED ADMIN — AZITHROMYCIN IVPB 250 ML: 500 mg | INTRAVENOUS | @ 14:00:00 | Stop: 2017-03-09 | NDC 63323039810

## 2017-03-09 MED ADMIN — FLUCONAZOLE 200 MG PO TABS: 400 mg | OROGASTRIC | @ 21:00:00 | Stop: 2017-03-11 | NDC 68084073501

## 2017-03-09 MED ADMIN — DEXMEDETOMIDINE 4 MCG/ML DRIP: 20.68 ug/h | INTRAVENOUS | @ 03:00:00 | Stop: 2017-03-11 | NDC 55150020902

## 2017-03-09 MED ADMIN — DEXMEDETOMIDINE 4 MCG/ML DRIP: 20.68 ug/h | INTRAVENOUS | @ 21:00:00 | Stop: 2017-03-11 | NDC 55150020902

## 2017-03-09 MED ADMIN — FENTANYL 2.5 MG/100 ML NS DRIP: 50 ug/h | INTRAVENOUS | @ 09:00:00 | Stop: 2017-03-13 | NDC 00409909461

## 2017-03-09 MED ADMIN — HEPARIN 25,000 UNITS/250 ML 0.45% NACL RTU: 1250 [IU]/h | INTRAVENOUS | @ 23:00:00 | Stop: 2017-03-13 | NDC 00409765062

## 2017-03-09 MED ADMIN — MEROPENEM 100 ML NS IVPB: 1 g | INTRAVENOUS | @ 13:00:00 | Stop: 2017-03-11 | NDC 63323050830

## 2017-03-09 MED ADMIN — ALBUMIN HUMAN 25 % IV SOLN: 25 g | INTRAVENOUS | @ 02:00:00 | Stop: 2017-03-12 | NDC 68516521601

## 2017-03-09 MED ADMIN — PROPOFOL INFUSION 10 MG/ML: 7755 ug/min | INTRAVENOUS | @ 14:00:00 | Stop: 2017-03-14 | NDC 63323026965

## 2017-03-09 MED ADMIN — CHLORHEXIDINE GLUCONATE 0.12 % MT SOLN: 10 mL | OROMUCOSAL | @ 17:00:00 | Stop: 2017-03-13 | NDC 52376002110

## 2017-03-09 MED ADMIN — ALBUMIN HUMAN 25 % IV SOLN: 25 g | INTRAVENOUS | @ 20:00:00 | Stop: 2017-03-12 | NDC 68516521601

## 2017-03-09 MED ADMIN — PROPOFOL INFUSION 10 MG/ML: 7755 ug/min | INTRAVENOUS | @ 09:00:00 | Stop: 2017-03-14 | NDC 63323026965

## 2017-03-09 MED ADMIN — CEFEPIME 1 GM/50 ML RTU: 1 g | INTRAVENOUS | @ 06:00:00 | Stop: 2017-03-09 | NDC 00338130141

## 2017-03-09 MED ADMIN — ALLOPURINOL 300 MG PO TABS: 300 mg | ORAL | @ 17:00:00 | Stop: 2017-03-10 | NDC 51079020620

## 2017-03-09 MED ADMIN — INSULIN ASPART 100 UNIT/ML SC SOPN: 3 mL | SUBCUTANEOUS | @ 08:00:00 | Stop: 2017-03-10 | NDC 00169633910

## 2017-03-09 MED ADMIN — PROPOFOL INFUSION 10 MG/ML: 7755 ug/min | INTRAVENOUS | @ 02:00:00 | Stop: 2017-03-14 | NDC 63323026965

## 2017-03-09 MED ADMIN — PROPOFOL INFUSION 10 MG/ML: 7755 ug/min | INTRAVENOUS | @ 08:00:00 | Stop: 2017-03-14 | NDC 63323026965

## 2017-03-09 MED ADMIN — POLYETHYLENE GLYCOL 3350 17 G PO PACK: 34 g | ORAL | @ 17:00:00 | Stop: 2017-03-13 | NDC 68084043098

## 2017-03-09 MED ADMIN — PROPOFOL INFUSION 10 MG/ML: 7755 ug/min | INTRAVENOUS | @ 21:00:00 | Stop: 2017-03-14 | NDC 63323026965

## 2017-03-09 MED ADMIN — PROPOFOL INFUSION 10 MG/ML: 7755 ug/min | INTRAVENOUS | @ 20:00:00 | Stop: 2017-03-14 | NDC 63323026965

## 2017-03-09 MED ADMIN — THIAMINE HCL 100 MG PO TABS: 100 mg | ORAL | @ 17:00:00 | Stop: 2017-03-13

## 2017-03-09 MED ADMIN — ALBUMIN HUMAN 25 % IV SOLN: 25 g | INTRAVENOUS | @ 13:00:00 | Stop: 2017-03-12 | NDC 68516521601

## 2017-03-09 MED ADMIN — PANTOPRAZOLE SODIUM 40 MG IV SOLR: 40 mg | INTRAVENOUS | @ 08:00:00 | Stop: 2017-03-13 | NDC 55150020210

## 2017-03-09 MED ADMIN — METOCLOPRAMIDE HCL 5 MG/ML IJ SOLN: 5 mg | INTRAVENOUS | @ 21:00:00 | Stop: 2017-03-13 | NDC 00409341401

## 2017-03-09 MED ADMIN — NOREPINEPHRINE 8, 16, 32 MG/250 ML DRIP: 10.34103.4 ug/min | INTRAVENOUS | @ 16:00:00 | Stop: 2017-03-10 | NDC 36000016210

## 2017-03-09 MED ADMIN — POTASSIUM CHLORIDE 20 MEQ/50ML RTU: 20 meq | INTRAVENOUS | @ 15:00:00 | Stop: 2017-03-09 | NDC 00338070341

## 2017-03-09 MED ADMIN — INSULIN ASPART 100 UNIT/ML SC SOPN: 3 mL | SUBCUTANEOUS | @ 16:00:00 | Stop: 2017-03-10 | NDC 00169633910

## 2017-03-09 MED ADMIN — DEXMEDETOMIDINE 4 MCG/ML DRIP: 20.68 ug/h | INTRAVENOUS | @ 13:00:00 | Stop: 2017-03-11 | NDC 55150020902

## 2017-03-09 MED ADMIN — DEXMEDETOMIDINE 4 MCG/ML DRIP: 20.68 ug/h | INTRAVENOUS | @ 02:00:00 | Stop: 2017-03-11 | NDC 55150020902

## 2017-03-09 MED ADMIN — PROPOFOL INFUSION 10 MG/ML: 7755 ug/min | INTRAVENOUS | @ 22:00:00 | Stop: 2017-03-14 | NDC 63323026965

## 2017-03-09 NOTE — Nursing Note
1900: Received report from Butch Penny, Therapist, sports. Initial assessments performed.  2015: MD Andria Frames notified re: febrile to 38.0, cooling measures implemented. HR ST to 110's. RT notified and to bedside, ABG 7.27; 48; 100; 21.4. Vent changes made per RT. MD aware, no new orders at this time.  2200: MD Andria Frames notified and to bedside. Patient remains tachycardic to 110-120's, afebrile. Per MD, continue to monitor. No new orders at this time.  0005: RT notified re: ABG 7.28; 45; 76; 20.2. No changes to vent settings at this time.  0400: MD Andria Frames notified re: patient remains tachycardic, Levophed titrated to 0.29mcg/kg/min, current net positive 1L. See orders.   0700: Bedside handoff and care endorsed to Butch Penny, [place].

## 2017-03-09 NOTE — Other
Patients Clinical Goal:   Clinical Goal(s) for the Shift: 1.) Monitor hemodynamic status, keep MAP > 65 on Levo gtt. 2.) Monitor respiratory status, on ARDSnet protocol. 3.) Bronchoscopy by MICU team. 4.) Safety and comfort.   Identify possible barriers to advancing the care plan:   Stability of the patient: Moderately Unstable - medium risk of patient condition declining or worsening    End of Shift Summary:     1.) Remains on Levophed gtt, requiring uptitration of dose to maintain MAP > 65. NSR to ST with PACs and PVCs. Also fevered in the afternoon, cooling measures done.     2.) On ARDS net protocol, no vent changes, ABG within acceptable range for ARDS. Tolerating turning and repositioning    3.) S/P Bronchoscopy done at bedside, BAL sent for studies. Per MICU fellow, don't send cytology. Patient tolerated procedure well.    4.) Remains on multiple gtts for comfort and analgesia in the setting of high vent settings for ARDS protocol. Patient appears comfortable, tolerating repositioning with not much changes on sats and BP.       Ethics Protocol   Ethics Risk Conflict Score: HIGH risk for ethical confict - is likely or very likely to develop under these conditions (more than 6 risk factors checked or ANY *critical risk factor checked)   Reason/Rationale (Why?): Potential escalation of non-beneficial treatment, poor/unclear prognosis, moral distress in family/clinicians   Additional Info (specify):    (Examples: Review the Goals of Care/Conference Note on (?), Ethics/Palliative Consult on (?); Family Conference set up with Attending/Team & primary decision makers on (?); important to be consistent and set limits with (?) about (?); Scientific laboratory technician in place; and etc.)

## 2017-03-09 NOTE — Consults
Remains intubated on the ventilator on gtts.Chemo daily.  Plan for meeting with the family to update the Goals of Care.

## 2017-03-09 NOTE — Progress Notes
MICU PROGRESS NOTE    PATIENT: Ryan Shepard       MRN: [id number]      DATE OF SERVICE: 03/08/2017         HOSPITAL DAY: 13  PRIMARY CARE PROVIDER: Sheppard Coil, MD  ATTENDING: Suzzette Righter., MD    DPOA: Anduin Kopitzke daughter [phone]    ID: Ryan Shepard???is a 77 y.o.???male???with a history of COPD (on 4LNC), HFpEF, afib, HTN, DM2, PAD s/p femoral graft, prior prostate CA who was admitted to the ICU for hypoxemic respiratory failure requiring BiPAP.    -- Lines/Tubes --   ETT (size 8.70mm), PICC (RUE), A-line (right radial), NGT  -- Drips --  Fent, Precedex, propofol, levophed  -- Antimicrobials --  Levofloxacin, cefepime, Vanc  ???    IE/SUBJECTIVE:   IE:  - No acute events overnight  .  Subjective:   - Intubated and sedated. Unable to assess    OBJECTIVE:   VITALS:  Temp:  [36 ???C (96.8 ???F)-37.9 ???C (100.2 ???F)] 37.4 ???C (99.3 ???F)  Heart Rate:  [70-116] 115  Resp:  [16-26] 26  Arterial Line BP (mmHg): (74-152)/(47-73) 95/56  MAP:  [59 mmHg-99 mmHg] 71 mmHg  FiO2 (%):  [70 %-100 %] 70 %  SpO2:  [85 %-95 %] 87 %  01/09 0701 - 01/10 0700  In: 3241.4 [I.V.:1901.4]  Out: 2865 [Urine:2715]     Wt Readings from Last 1 Encounters:   03/06/17 (!) 103.8 kg (228 lb 13.4 oz)     Recent Labs      03/08/17   1617  03/08/17   1147  03/08/17   0800  03/08/17   0400   GLUCOSEPOC  113*  133*  147*  148*   }  Oxygen Therapy  SpO2: (!) 87 %  O2 Device: ETT, Mechanical Ventilator  FiO2 (%): 70 %  O2 Flow Rate (L/min): 40 L/min     Vent Mode: VC  FiO2 (%):  [70 %-100 %] 70 %  Rate:  [26] 26  VT (mL):  [480 mL] 480 mL  PEEP (setting):  [14 cmH2O] 14 cmH2O    PHYSICAL EXAM:  Unchanged from yest  General - Intubated/Sedated  Neuro - Sedated, +gag, pupils reactive  Cardiovascular - RRR no m/r/g, unable to assess JVP d/t girth, 2+ radial pulses b/l  Lungs - No audible cuff leak, no wheezing anteriorly, mechanical breath sounds  Skin - No visible rashes or lesions  Abdomen - Central obesity, soft, NT/ND Extremeties - Warm and well-perfused. 1+ LE edema    LABORATORY DATA:   BMP  Recent Labs      03/08/17   1618  03/08/17   0400  03/07/17   1649   NA  143  143  144   K  4.1  3.9  4.7   CL  100  100  101   CO2  22  21  23    BUN  88*  79*  73*   CREAT  2.19*  2.08*  1.97*   CALCIUM  9.4 ~ 9.4  9.6  9.6   MG  2.4*  2.4*  2.4*   PHOS  6.6*  5.2*  5.2*     CBC  Recent Labs      03/08/17   0400  03/07/17   0408  03/06/17   0358   WBC  6.75  8.75  8.21   HGB  10.2*  10.5*  9.9*   HCT  30.9*  33.4*  31.0*   MCV  92.0  92.0  93.1   PLT  187  184  152     Coags  Recent Labs      03/08/17   1618   APTT  40.3*     Blood gas  Recent Labs      03/08/17   1618   PHART  7.30*   PCO2ART  47*   PO2ART  65*   BICARBART  22.3   BEART  -4*       Micro:    Microbiology:   12/31 BAL cx - neg or NGTD  12/28 Resp Cx Probable C.albicans  12/28 Blood Cx NTD  12/28 RVP neg  12/18 Resp Cx Neg (C albicans)  12/13 BAL Cx +mycobacteria  12/13 respiratory culture: candida, therwise negative so far; AFB smear negative x 2 (AFB cultures NTD)  12/13 legionella culture neg  Aspergillus Ag EIA - ???<0.50  RVP panel negative  Aspergillus fumigatus (M3) IgE - NEG   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  HIV - neg  Legionella Urinary Ag - neg  Chlamydia/gonorrhea PCR - neg  RPR nonreactive    IMAGING:   CXR (1/10):    ASSESSMENT & PLAN:   Ryan Shepard???is a 77 y.o.???male???with a history of COPD (on 3LNC), HFpEF, afib, HTN, DM2, PAD s/p femoral graft, prior prostate [place] who was admitted for hypoxemic respiratory failure requiring intubation.    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 CT head negative for brain mets.  - Heme-onc consulted, appreciate recs.   - Family meeting with wife (DPOA), daughter on phone, primary ICU team and Heme-Onc on 1/7 PM. Discussed benefits/adverse effects of chemotherapy and possibility that this may help decr tumor burden in lung as possible bridge to extubation. Family agreed to initiation.  - Cont chemo (caroplatin/etoposide) initiated 1/7 PM  - Cont Allopurinol 300mg  BID, can decr dose to 300mg  qday if renal function deteriorates  - F/u G6PD testing in case rasburicase is needed  - TLS labs q12 (BMP, Phos, uric acid, LDH)  - Given worsening AKI, will DC lovenox and start hep gtt for [facility]    RESP:  #Acute on chronic hypoxemic respiratory failure  Admitted from clinic requiring BiPAP in setting of decreased arterial PaO2 and desats to low 80s. Recently admitted w/ bronch showing chronic colonization with aspergillus. Has features c/f malignancy on imaging. Respiratory status continues to worsen with worsened xray. S/p upsized ETT on 1/1 w/ resolution of cuff leak. Initial c/f PE, CTA read no PE so d/c'd heparin gtt, LE dopplers negative for DVT.   - Bronch today showing thin clear secretions throughout  - ARDS NET protocol  - Serial ABGs   - Malignancy tx, as below  - Antibiotics, as below    #Chronic COPD  Increased sputum production x 2 months, slight wheezing and recently treated with steroids while hospitalized. Current presentation less c/w COPD exacerbation.  - Duonebs q6h  - Inhaled Flovent BID  - Empiric abx, as below  ???  CV:  #Hypovolemic shock in setting of sedation, improving  Patient requires significant sedation d/t air leak on cuff of ETT, patient changing position can increase air leak. Hypotension requiring pressors in setting of sedation but elevated lactate and signs of end-organ damage including rising Cr. Patient has leukocytosis and on empiric abx, no other signs of infection. - A-line for accurate BP monitoring  - Pressors: cont levophed gtt  - S/p hydrocortisone and fludrocortisone (1/2-1/7)  - Trend lactate  - Goal MAPs > 65 on A-line    #HFpEF  Home dose lasix PO 40mg  BID. TTE shows hyperdynamic EF >75% and elevated PASP to .  - D/c albumin  -DC lasix gtt in setting of worsening AKI  - BID lytes, daily weights, strict I&O's     #Afib  Rate-controlled. CHADS2VaSC 3-4  - Hold home carvedilol 25 mg BID  - Hold home apixaban 5 mg BID  - start hep gtt as above    NEURO:  #Intubated and sedated 2/2 hypoxemic respiratory failure  - Cont sedation with fentanyl/precedex/propofol  - Attempt sedation holiday as tolerated  - Repeat triglycerides q72h while on propofol    ID:  #Leukocytosis, improving  Afebrile. Increased sputum production x 2 months otherwise no localizing signs of infection. Did receive course of methylprednisolone 12/18-12/23 on recent hospitalization. 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 +Mycobacteria 12/13 BAL resulted on 03/01/17, not nocardia as previously thought.  - ID signed off, appreciate recs  - S/p IV Vanc  - S/p Azithromycin  - F/u urine histoplasma Ag (sendout lab), hold antifungal tx at this time  - S/p IV Zosyn  - S/p IV Bactrim 500mg  q8hr given that patient does not have pulmonary nocardia  - Cont levofloxacin renally dosed (q48h) for atypical coverage/GN coverage (1/2- )  - Cont empiric IV cefepime q12h for GP coverage (1/6- ) and  - cont vancomycin (1/9-)  ??????  CHRONIC  #HTN  - Hold home BP meds in setting of shock: nifedipine 30mg  daily, losartan 100 mg daily, carvedilol 25 mg BID  ???  #Chronic???pain   Localized to lower back and lower extremities  - Hold home Norco 5-325 q6h PRN while sedated  ???  #GERD chronic/stable  - Pantoprazole PO 40 mg daily 12/31  ???  #Depression/Anxiety  Mood stable, no SI, no anxiety  - Hold home buspirone 15 mg BID, duloxetine DR 60 while sedated  ???  #PAD  s/p femoral-femoral graft 2009. - [place] home clopidogrel 75 mg daily  ???  #HLD chronic/stable  - Hold home Pravastatin 40 mg qhs  ???  #DM2 with associated diabetic peripheral neuropathy  - ISS#3 now that TPN started  - [place] home gabapentin 800 mg TID   ???  F - restart TFs. q6h refeeding labs, supp K/Mg/Phos PRN  A - Sedated  S - Fent/precedex/propofol  T -  Hep gtt  H - 30 degrees  U - PPI BID  G - ISS#3, Accuchecks q4h  B - Senna qhs, suppository, miralax  I - ETT 8.93mm (upsided 1/1), PIV 22g R hand (12/28), CVC 3-Lumen L fem (1/1), A-line (12/31), OGT (12/31), PIV x 1 L hand    Disposition: ICU level care required for hypoxemic respiratory failure requiring intubation  ???  Code Status: Full Code  Contact:  Primary Emergency ContactCamrin, [place], Home Phone: [phone]    DPOA is daughter Dorrance Deitch daughter [phone]    DWA Dr. Rayfield Citizen, PGY-1  [phone]    Critical Care Attending Note:  ???  I  have seen and examined and personally managed this patient who remains critically ill and high risk for clinical deterioration related to acute hypoxic resp failure in setting of underlying COPD, multifocal PNA, and new dx of small cell lung CA with concurrent malignant effusion and pericardial effusion.  ???  Remains quite hypoxic despite recent chemo. Continuing abxs and repeated bronch today for diagnostic and therapeutic reasons. Bedside US with sig pericardial effusion and getting formal echo with cards consult. Worsening renal function and holding off on further diuresis. Also on pressors for shock likely related to sedation. Remains on anticoag for afib but switching to heparin drip with worsening renal function. Continuing protective ventilation with ARDS net protocol and other supportive ICU care. Overall prognosis quite guarded and plan for family meeting soon esp if pt does not improve with recent chemo.   ???  Critical Care Time Personally Spent in Patient Care: 79 minutes, exclusive of procedures.  ???  Rosabelle Jupin S. Regino Schultze

## 2017-03-09 NOTE — Procedures
PULMONARY PROCEDURE NOTE    DATE OF PROCEDURE: 03/08/2017    TYPE OF PROCEDURE:  1. Flexible video bronchoscopy.  2. Bronchoalveolar lavage.  3. Therapeutic clearance of secretions    ATTENDING PHYSICIAN:  Dreama Saa, MD    ASSISTING PHYSICIAN: Newt Lukes, MD    PRE-PROCEDURE DIAGNOSIS / INDICATION  1. Hypoxemic respiratory failure  2. Pneumonia    BRONCHOSCOPIC FINDINGS:  1. Thin clear secretions throughout  2. Extrinsic compression as well as endobronchial obstruction of LUL, narrowed lingula    MEDICATIONS / SEDATION:  Fentanyl gtt  Propofol gtt  Precedex gtt  Lidocaine 1%, 1 cc topically to the lower airways via bronchoscope    DESCRIPTION OF PROCEDURE:  After discussion of risks, benefits, and alternatives of the procedure, informed consent was obtained from the patient who agreed to proceed. This was a flexible bronchoscopy for evaluation of pneumonia and possible mucous plugging.   A time out was performed prior to initiation of the procedure. The patient received topical anesthesia and sedation with the aforementioned medications, and the bronchoscope was inserted into the ETT and into the trachea.      All lobar segments and sub-segments in the bilateral lungs were visualized. The right lung was examined and all segments were normal with thin secretions throughout. The left upper lobe was obstructed, with evidence of endobronchial lesions as well as extrinsic compression. The entrance to the lingula was edematous, but scope was able to enter the lingula. The bronchoscope was wedged in the lingula and bronchoalveolar lavage was collected, with 50cc sterile saline instilled and approximately 15cc of yellow, cloudy bronchoalveolar lavage fluid returned. The patient tolerated the procedure well without complications.    COMPLICATIONS:  none    EBL < 5 CC    SPECIMENS:  1. Bronchoalveolar lavage 15cc for micro    Dr. Regino Schultze was present for and supervised the entire procedure.    Newt Lukes, MD 03/08/2017, 7:23 PM  [facility]    Attending Addendum: I agree with the above procedure note. I was present for the entire procedure which was performed without complications.     Lorance Pickeral S. Regino Schultze

## 2017-03-09 NOTE — Progress Notes
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 Started on palliative chemotherapy with carbo/etoposide 03/05/17.     Interval History/Subjective:  - C1D4 of carbo/etoposide (has received all chemotherapy doses).  - Underwent bronchoscopy yesterday for therapeutic clearance of secretions. Remains on high vent settings (FiO2 80%, PEEP 15) and low-dose norepi while sedated.   - Labs do not show clear evidence of TLS, more consistent with AKI. Cr is up to 1.72 today.  - I spoke with the patient's wife and daughter today to review the diagnosis, treatment and prognosis.    PHYSICAL EXAM     Last Recorded Vital Signs:    03/09/17 0945   BP:    Pulse: 92   Resp: 30   Temp:    SpO2: (!) 87%       Intake/Output Summary (Last 24 hours) at 03/09/17 1106  Last data filed at 03/09/17 1000   Gross per 24 hour   Intake          3468.41 ml   Output             1455 ml   Net          2013.41 ml      General: Intubated and sedated. Diffuse anasarca.  HEENT: Sclera anciteric. Conjunctiva pale. MMM.  CV: RRR. No frank m/r/g appreciated.  Pulm: Mechanically ventilated with symmetric chest rise. Coarse breath sounds.  Abd: +BS. Soft, mildly distended.  Ext: Trace lower extremity edema. No asymmetry noted. OBJECTIVE DATA   LABS  Lab Results   Component Value Date    WBC 6.31 03/09/2017    NEUTABS 5.25 03/09/2017    HGB 9.8 (L) 03/09/2017    HCT 31.7 (L) 03/09/2017    PLT 184 03/09/2017     Lab Results   Component Value Date    PT 16.2 (H) 03-03-2017    INR 1.4 March 03, 2017    APTT 65.1 (H) 03/09/2017     Lab Results   Component Value Date    NA 144 03/09/2017    K 3.9 03/09/2017    CL 100 03/09/2017    CO2 19 (L) 03/09/2017    BUN 93 (H) 03/09/2017    CREAT 2.55 (H) 03/09/2017    GLUCOSE 155 (H) 03/09/2017    CALCIUM 9.3 03/09/2017    MG 2.2 (H) 03/09/2017    PHOS 6.8 (H) 03/09/2017    LDH 420 (H) 03/09/2017    URICACID 5.2 03/09/2017     Lab Results   Component Value Date    APTT 65.1 (H) 03/09/2017    PT 16.2 (H) 03-Mar-2017    INR 1.4 Mar 03, 2017     Lab Results   Component Value Date    ALT 11 03/09/2017    AST 22 03/09/2017  BILITOT 0.5 03/09/2017    ALKPHOS 49 03/09/2017    ALBUMIN 4.3 03/09/2017    TOTPRO 6.6 03/09/2017     Pertinent Microbiology:   1/5 L pleural fluid cx NGTD (exudative fluid)  1/3 Resp cx Probable C.albicans  1/2 BCx x2 NGTD  1/2 UA neg  12/31 BAL cx - neg or NGTD  12/28 Resp Cx Probable C.albicans  12/28 Blood Cx NTD  12/28 RVP neg  12/18 Resp cx Neg (C albicans)  12/13 BAL Nocardia cx: Rare beaded Gram positive rods --> fast-growing mycobacterium  12/13 AFB cx Mycobacterium species  12/13 BAL cx Neg  12/13 Respiratory culture: Candida, otherwise negative so far; AFB smear negative x 2  12/13 Legionella culture neg  Aspergillus Ag EIA - ???<0.50  RVP panel negative  Aspergillus fumigatus (M3) IgE - NEG   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  Pertinent Imaging:   02/16/17 CT chest with contrast   - Showed prominent mediastinal and hilar lymphadenopathy, with encasement of left CCA and partial encasement of right innominate and left subclavian.   - Airspace and nodular consolidation prominent in LUL and lingula. Moderate left pleural effusion, with multiple pleural nodules. Small pericardial effusion.  ???  02/16/17 CT A/P with contrast   - Showed at least 4 hypervascular liver lesions within right hepatic lobe, largest measuring 5.0 x 3.6 cm.    - Moderate atherosclerotic calcifications throughout abdominal vessels and renovascular calcifications. Patent fem-fem bypass. Multifocal arterial stenosis in femoral arteries.     02/27/17 CT chest, angiogram   - Did not show evidence of PE.   - Redemonstration of airspace and nodular consolidation, most prominent in LUL and lingula, bilateral mediastinal and lymphadenopathy.   - Interval increase in moderate left pleural effusion. Multiple pleural nodules. Interval increase in small pericardial effusion.   ???  Pertinent Pathology:   02/26/17 BAL    - Cytology was negative for malignant cells. Flow cytometry did not show evidence of lymphoproliferative disease.   ???  03/01/17 Biopsy of left liver lesion   - Metastatic poorly differentiated neuroendocrine carcinoma. Synaptophysin/chromogranin positive. TTF-1 patchy positive. PSA negative. Ki67 > 90%. Flow cytometry was limited due to low quantity of recoverable cells, but no discrete monotypic B-cell population seen.       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 Palliative chemotherapy with carbo/etoposide was started on 03/05/17.  ???  #Extensive-stage small cell carcinoma, likely lung primary, with metastases to pleura and liver (CT head negative for brain mets)  #Risk for tumor lysis syndrome  I discussed with the patient's wife and daughter the diagnosis and the rationale for initiation of therapy, as well as the potential associated risks. The goal of therapy is palliative. We will see how he does with induction and hope  that his respiratory status will improve such that he can be extubated. Further treatment will be discussed pending possible response and clinical course. I discussed that there are significant risks associated with chemotherapy, but that given his dire clinical situation, the potential benefit outweighs the risk. Risks include but are not limited to tumor lysis syndrome, leading to renal failure with potential need for initiation of dialysis, worsening of fluid overload that may exacerbate respiratory status, and neutropenic sepsis.   ???  Carboplatin IV AUC of 4 once D1 (dose reduced due to critical illness) --> done  Etoposide IV 80 mg/m2 D1-D3 (dose reduced due to critical illness and AKI) --> anticipate finishing on 1/9  G-CSF following completion of chemotherapy   ???  - Continue allopurinol.  - Will start G-CSF today, for prophylaxis (Plan ~ 7 days).  - Reviewed diagnosis, treatment and prognosis with family. Discussed that it is too early to know if chemotherapy was helpful, but that watching his clinical status in the next days to weeks would give [place] information regarding patient's prognosis.     Patient discussed with attending Dr. Clifton James. Please feel free to page [phone] with questions/concerns.  ???  Nydia Bouton  Hematology/Oncology Fellow  [phone]  ???      The findings, assessment, and recommendations are as noted. MR.

## 2017-03-09 NOTE — Other
Patients Clinical Goal:   Clinical Goal(s) for the Shift: 1) Maintain hemodynamic stability, MAP >65. 2) ARDS Net Protocol. 3) Monitor I/O. 4) Maintain pt safety and comfort.  Identify possible barriers to advancing the care plan: hemodynamic instability, pulmonary instability  Stability of the patient: Moderately Unstable - medium risk of patient condition declining or worsening    End of Shift Summary:     1) Levophed titrated to 0.92mcg/kg/min, currently at 0.35mcg/kg/min to maintain MAP >65. Tachycardic to 110-120 overnight, frequent PVC/PAC.Trop 0.04, BNP 213, Procalcitonin 2.05. 61mEq KCl administered for K 3.9. Mg 2.2, [place] 9.3. Tmax 38.0C (bladder), cooling measures implemented, current temp 37.1. Sepsis workup completed, blood cultures x2 and UA sent, received meropenem 1g IVPB and azithromycin 500mg  IVPB x1.  2) ARDS Net Protocol; Volume Control vT 480; Rate 30; PEEP 10; FiO2 60%. Lung sounds CTA, scant secretions suctioned from ETT, tan thick. CXR pending.   Results for Ryan Shepard, Ryan Shepard (MRN [id number]) as of 03/09/2017 06:30   03/08/2017 20:01 03/09/2017 00:01 03/09/2017 04:01 03/09/2017 05:35   pH 7.27 (L) 7.28 (L) 7.26 (L) 7.30 (L)   pCO2 48 (H) 45 (H) 47 (H) 42   pO2 100 76 (L) 78 (L) 72 (L)   Bicarbonate 21.4 (L) 20.2 (L) 20.3 (L) 20.1 (L)   Base Excess -5 (L) -6 (L) -6 (L) -5 (L)   O2 Sat/Measured 97.1 93.2 (L) 93.7 (L) 93.7 (L)   Inspired O2 70% FiO2 70% FiO2 PEEP 12 ... 70% PEEP 12 Rate 28 70% FiO2 PEEP 10 ...   3) Net I/O positive 1.7L. UOP 748mL for 12H. BM x1, brown, smear. Trickle feeds continued at 35ml/hr, residuals 0-46mL overnight. Heparin gtt continued at 1050 units/hr.   4) Precedex continued at 49mcg/kg/hr, Fentanyl continued at 215mcg/hr, Propofol continued at 42mcg/kg/min for comfort and analgesia. CHG bath given. Safety and comfort maintained throughout shift.

## 2017-03-09 NOTE — Nursing Note
0730: Episode of desat to the high 70s, patient sedated and appeared comfortable. High peak pressure alarm on vent, suctioned small amount of thin frothy white secretions. RT called to bedside, vent settings adjusted. MD Refugio called to bedside, no new orders. ABG sent, SpO2 responded to vent setting change, Will continue to monitor.

## 2017-03-09 NOTE — Progress Notes
Pharmaceutical Services ??? Vancomycin Dosing (Ongoing)    Patient Name: Ryan Shepard  MRN: [id number]  Age: 77 y.o.  Sex: male    Vancomycin per Pharmacy Consult Order     Start     Ordered    03/07/17 1927  vancomycin per pharmacy  Per Protocol     Question Answer Comment   Indication Pneumonia    Goal Trough Serum Level 15-20 mcg/mL    Has patient received a dose of this drug within the past 72 hours? No        03/07/17 1927        Vital Signs/Other Objective Data  Allergies: Patient has no known allergies.    BP 99/48  ~ Pulse 99  ~ Temp 37.4 ???C (99.3 ???F)  ~ Resp 21  ~ Ht 1.854 m (6' 0.99'')  ~ Wt (!) 103.8 kg (228 lb 13.4 oz)  ~ SpO2 (!) 86%  ~ BMI 30.20 kg/m???     Intake/Output Summary (Last 24 hours) at 03/08/17 1910  Last data filed at 03/08/17 1600   Gross per 24 hour   Intake          3353.29 ml   Output             2495 ml   Net           858.29 ml       Medications  Med Administrations and Associated Flowsheet Values (last 72 hours)  Vancomycin administration    Date/Time Action Medication Dose Rate    03/07/17 2043 New Bag/ Syringe/ Cartridge    vancomycin 1.25 g in dextrose 5% 250 mL IVPB 1.25 g 250 mL/hr        Labs (most recent)  White Blood Cell Count   Date Value Ref Range Status   03/08/2017 6.75 4.16 - 9.95 x10E3/uL Final   01/25/2008 7.82 3.28 - 9.29 x10E3/uL      Urea Nitrogen   Date Value Ref Range Status   03/08/2017 88 (H) 7 - 22 mg/dL Final   19/14/7829 33 (H) 7 - 23 mg/dL Final     Creatinine   Date Value Ref Range Status   03/08/2017 2.19 (H) 0.60 - 1.30 mg/dL Final   56/21/3086 0.9 0.5 - 1.3 mg/dL      Vancomycin,random (mcg/mL)   Date/Time Value   03/08/2017 1807 11.8       Assessment  Indication Pneumonia  Goal trough level 15-20 mcg/mL  This patient is receiving dialysis: No    Plan  Ryan Shepard is a 77 y.o. male who has been referred to pharmacy for vancomycin dosing. The indication for vancomycin is Pneumonia. Based on the measured vancomycin level, will re-dose with 750 mg x 1 dose.    Pharmacy will continue to monitor the patient's clinical progress. The next vancomycin trough is scheduled for 1/11 at 1600.    Abby Potash, PharmD, 03/08/2017, 7:10 PM

## 2017-03-10 ENCOUNTER — Ambulatory Visit: Payer: PRIVATE HEALTH INSURANCE

## 2017-03-10 LAB — Blood Gases, arterial
ABG INSPIRED O2: 80 mmol/L (ref ?–2)
O2 SATURATION/MEASURED: 95.2 (ref >=95.0)
O2 SATURATION/MEASURED: 91.9 — ABNORMAL LOW (ref >=95.0)
O2 SATURATION/MEASURED: 97 (ref >=95.0)
PH: 7.28 mmol/L — ABNORMAL LOW (ref 7.37–7.41)
PO2: 78 mmHg — ABNORMAL LOW (ref 98–118)

## 2017-03-10 LAB — Bacterial Culture Respiratory

## 2017-03-10 LAB — APTT
APTT: 84.9 s — ABNORMAL HIGH (ref 24.4–36.2)
APTT: 98.6 s — ABNORMAL HIGH (ref 24.4–36.2)
APTT: 106.3 s — ABNORMAL HIGH (ref 24.4–36.2)

## 2017-03-10 LAB — Uric Acid: URIC ACID: 5.2 mg/dL (ref 3.4–8.8)

## 2017-03-10 LAB — Lactate Dehydrogenase
LACTATE DEHYDROGENASE: 428 U/L — ABNORMAL HIGH (ref 125–256)
LACTATE DEHYDROGENASE: 395 U/L — ABNORMAL HIGH (ref 125–256)

## 2017-03-10 LAB — Urea Nitrogen,Random Urine: UREA NITROGEN,RANDOM URINE: 330 mg/dL

## 2017-03-10 LAB — Glucose,POC
GLUCOSE,POC: 162 mg/dL — ABNORMAL HIGH (ref 65–99)
GLUCOSE,POC: 165 mg/dL — ABNORMAL HIGH (ref 65–99)
GLUCOSE,POC: 178 mg/dL — ABNORMAL HIGH (ref 65–99)
GLUCOSE,POC: 186 mg/dL — ABNORMAL HIGH (ref 65–99)
GLUCOSE,POC: 191 mg/dL — ABNORMAL HIGH (ref 65–99)
GLUCOSE,POC: 210 mg/dL — ABNORMAL HIGH (ref 65–99)

## 2017-03-10 LAB — Basic Metabolic Panel
ANION GAP: 25 mmol/L — ABNORMAL HIGH (ref 8–19)
GFR ESTIMATE FOR AFRICAN AMERICAN: 24 mmol/L (ref 3.6–5.3)
SODIUM: 140 mmol/L (ref 135–146)
TOTAL CO2: 19 mmol/L — ABNORMAL LOW (ref 20–30)

## 2017-03-10 LAB — Vancomycin,random: VANCOMYCIN,RANDOM: 14.4 ug/mL

## 2017-03-10 LAB — CBC: RED CELL DISTRIBUTION WIDTH-CV: 15.8 — ABNORMAL HIGH (ref 11.1–15.5)

## 2017-03-10 LAB — Magnesium
MAGNESIUM: 2.3 meq/L — ABNORMAL HIGH (ref 1.4–1.9)
MAGNESIUM: 2.1 meq/L — ABNORMAL HIGH (ref 1.4–1.9)

## 2017-03-10 LAB — Legionella Culture: LEGIONELLA CULTURE: NEGATIVE

## 2017-03-10 LAB — Blood Lactate: BLOOD LACTATE: 17 mg/dL (ref 5–25)

## 2017-03-10 LAB — Hepatic Funct Panel: ALANINE AMINOTRANSFERASE: 10 U/L (ref 8–64)

## 2017-03-10 LAB — Creatinine,Random Urine: CREATININE,RANDOM URINE: 44.1 mg/dL

## 2017-03-10 LAB — Anaerobic Culture: ANAEROBIC CULT-GM ST: NEGATIVE

## 2017-03-10 LAB — Phosphorus
PHOSPHORUS: 6.9 mg/dL — ABNORMAL HIGH (ref 2.3–4.4)
PHOSPHORUS: 7.3 mg/dL — ABNORMAL HIGH (ref 2.3–4.4)

## 2017-03-10 LAB — Sodium,Random,Ur: SODIUM,RANDOM URINE: 42 mmol/L

## 2017-03-10 LAB — Differential, Manual: ABSOLUTE MONO CT,MANUAL: 0.2 10*3/uL (ref 0.2–0.8)

## 2017-03-10 MED ADMIN — NOREPINEPHRINE 8, 16, 32 MG/250 ML DRIP: 10.34103.4 ug/min | INTRAVENOUS | @ 05:00:00 | Stop: 2017-03-10

## 2017-03-10 MED ADMIN — SENNOSIDES 8.6 MG PO TABS: 2 | ORAL | @ 04:00:00 | Stop: 2017-03-11 | NDC 00904652261

## 2017-03-10 MED ADMIN — NOREPINEPHRINE 8, 16, 32 MG/250 ML DRIP: 10.34103.4 ug/min | INTRAVENOUS | @ 16:00:00 | Stop: 2017-03-10 | NDC 36000016210

## 2017-03-10 MED ADMIN — PROPOFOL INFUSION 10 MG/ML: 7755 ug/min | INTRAVENOUS | @ 02:00:00 | Stop: 2017-03-14 | NDC 63323026965

## 2017-03-10 MED ADMIN — POTASSIUM CHLORIDE 20 MEQ/15ML (10%) PO SOLN: 20 meq | OROGASTRIC | @ 04:00:00 | Stop: 2017-03-10 | NDC 66689004750

## 2017-03-10 MED ADMIN — CHLORHEXIDINE GLUCONATE 0.12 % MT SOLN: 10 mL | OROMUCOSAL | @ 16:00:00 | Stop: 2017-03-13 | NDC 52376002110

## 2017-03-10 MED ADMIN — ALLOPURINOL 300 MG PO TABS: 300 mg | ORAL | @ 16:00:00 | Stop: 2017-03-13 | NDC 51079020620

## 2017-03-10 MED ADMIN — FUROSEMIDE 10 MG/ML DRIP (STRAIGHT DRUG): 50 mg/h | INTRAVENOUS | @ 08:00:00 | Stop: 2017-03-11 | NDC 36000028425

## 2017-03-10 MED ADMIN — HEPARIN 25,000 UNITS/250 ML 0.45% NACL RTU: 1250 [IU]/h | INTRAVENOUS | @ 16:00:00 | Stop: 2017-03-13 | NDC 00409765062

## 2017-03-10 MED ADMIN — METOCLOPRAMIDE HCL 5 MG/ML IJ SOLN: 5 mg | INTRAVENOUS | @ 04:00:00 | Stop: 2017-03-13 | NDC 00409341401

## 2017-03-10 MED ADMIN — DEXMEDETOMIDINE 4 MCG/ML DRIP: 20.68 ug/h | INTRAVENOUS | @ 03:00:00 | Stop: 2017-03-11

## 2017-03-10 MED ADMIN — ONCOLOGY MOUTHWASH: 30 mL | ORAL | @ 04:00:00 | Stop: 2017-03-13 | NDC 00067628212

## 2017-03-10 MED ADMIN — HEPARIN 25,000 UNITS/250 ML 0.45% NACL RTU: 1250 [IU]/h | INTRAVENOUS | @ 03:00:00 | Stop: 2017-03-13

## 2017-03-10 MED ADMIN — PROPOFOL INFUSION 10 MG/ML: 7755 ug/min | INTRAVENOUS | @ 10:00:00 | Stop: 2017-03-14 | NDC 63323026965

## 2017-03-10 MED ADMIN — INSULIN ASPART 100 UNIT/ML SC SOPN: 3 mL | SUBCUTANEOUS | @ 21:00:00 | Stop: 2017-03-11 | NDC 00169633910

## 2017-03-10 MED ADMIN — FENTANYL 2.5 MG/100 ML NS DRIP: 50 ug/h | INTRAVENOUS | @ 14:00:00 | Stop: 2017-03-13 | NDC 00409909461

## 2017-03-10 MED ADMIN — METOCLOPRAMIDE HCL 5 MG/ML IJ SOLN: 5 mg | INTRAVENOUS | @ 13:00:00 | Stop: 2017-03-13 | NDC 00409341401

## 2017-03-10 MED ADMIN — HEPARIN 25,000 UNITS/250 ML 0.45% NACL RTU: 1250 [IU]/h | INTRAVENOUS | @ 16:00:00 | Stop: 2017-03-13

## 2017-03-10 MED ADMIN — DEXMEDETOMIDINE 4 MCG/ML DRIP: 20.68 ug/h | INTRAVENOUS | @ 16:00:00 | Stop: 2017-03-11 | NDC 55150020902

## 2017-03-10 MED ADMIN — PROPOFOL INFUSION 10 MG/ML: 7755 ug/min | INTRAVENOUS | Stop: 2017-03-14 | NDC 63323026965

## 2017-03-10 MED ADMIN — FENTANYL 2.5 MG/100 ML NS DRIP: 50 ug/h | INTRAVENOUS | @ 17:00:00 | Stop: 2017-03-13 | NDC 00409909461

## 2017-03-10 MED ADMIN — FENTANYL 2.5 MG/100 ML NS DRIP: 50 ug/h | INTRAVENOUS | @ 03:00:00 | Stop: 2017-03-13

## 2017-03-10 MED ADMIN — FUROSEMIDE 10 MG/ML DRIP (STRAIGHT DRUG): 50 mg/h | INTRAVENOUS | @ 03:00:00 | Stop: 2017-03-11

## 2017-03-10 MED ADMIN — NOREPINEPHRINE 8, 16, 32 MG/250 ML DRIP: 10.34103.4 ug/min | INTRAVENOUS | @ 21:00:00 | Stop: 2017-03-13 | NDC 36000016210

## 2017-03-10 MED ADMIN — METOLAZONE 5 MG PO TABS: 5 mg | OROGASTRIC | @ 13:00:00 | Stop: 2017-03-10 | NDC 51079002420

## 2017-03-10 MED ADMIN — METOCLOPRAMIDE HCL 5 MG/ML IJ SOLN: 5 mg | INTRAVENOUS | @ 19:00:00 | Stop: 2017-03-13 | NDC 00409341401

## 2017-03-10 MED ADMIN — CHLORHEXIDINE GLUCONATE 0.12 % MT SOLN: 10 mL | OROMUCOSAL | @ 06:00:00 | Stop: 2017-03-13 | NDC 52376002110

## 2017-03-10 MED ADMIN — ONCOLOGY MOUTHWASH: 30 mL | ORAL | @ 19:00:00 | Stop: 2017-03-13 | NDC 00067628212

## 2017-03-10 MED ADMIN — PROPOFOL INFUSION 10 MG/ML: 7755 ug/min | INTRAVENOUS | @ 16:00:00 | Stop: 2017-03-14

## 2017-03-10 MED ADMIN — POLYETHYLENE GLYCOL 3350 17 G PO PACK: 34 g | ORAL | @ 04:00:00 | Stop: 2017-03-13 | NDC 68084043098

## 2017-03-10 MED ADMIN — INSULIN ASPART 100 UNIT/ML SC SOPN: 3 mL | SUBCUTANEOUS | @ 04:00:00 | Stop: 2017-03-10 | NDC 00169633910

## 2017-03-10 MED ADMIN — POLYETHYLENE GLYCOL 3350 17 G PO PACK: 34 g | ORAL | @ 16:00:00 | Stop: 2017-03-13 | NDC 68084043098

## 2017-03-10 MED ADMIN — VANCOMYCIN 750 MG/150 ML RTU: 750 mg | INTRAVENOUS | @ 04:00:00 | Stop: 2017-03-10 | NDC 00338358048

## 2017-03-10 MED ADMIN — ALBUMIN HUMAN 25 % IV SOLN: 25 g | INTRAVENOUS | @ 14:00:00 | Stop: 2017-03-12 | NDC 68516521601

## 2017-03-10 MED ADMIN — ONCOLOGY MOUTHWASH: 30 mL | ORAL | @ 14:00:00 | Stop: 2017-03-13 | NDC 00067628212

## 2017-03-10 MED ADMIN — ALBUMIN HUMAN 25 % IV SOLN: 25 g | INTRAVENOUS | @ 02:00:00 | Stop: 2017-03-12 | NDC 68516521601

## 2017-03-10 MED ADMIN — NOREPINEPHRINE 8, 16, 32 MG/250 ML DRIP: 10.34103.4 ug/min | INTRAVENOUS | @ 10:00:00 | Stop: 2017-03-10 | NDC 36000016210

## 2017-03-10 MED ADMIN — PANTOPRAZOLE SODIUM 40 MG IV SOLR: 40 mg | INTRAVENOUS | @ 08:00:00 | Stop: 2017-03-13 | NDC 55150020210

## 2017-03-10 MED ADMIN — INSULIN ASPART 100 UNIT/ML SC SOPN: 3 mL | SUBCUTANEOUS | Stop: 2017-03-10 | NDC 00169633910

## 2017-03-10 MED ADMIN — THIAMINE HCL 100 MG PO TABS: 100 mg | ORAL | @ 16:00:00 | Stop: 2017-03-13

## 2017-03-10 MED ADMIN — INSULIN ASPART 100 UNIT/ML SC SOPN: 3 mL | SUBCUTANEOUS | @ 09:00:00 | Stop: 2017-03-10 | NDC 00169633910

## 2017-03-10 MED ADMIN — FENTANYL 2.5 MG/100 ML NS DRIP: 50 ug/h | INTRAVENOUS | @ 17:00:00 | Stop: 2017-03-13

## 2017-03-10 MED ADMIN — FUROSEMIDE 10 MG/ML DRIP (STRAIGHT DRUG): 50 mg/h | INTRAVENOUS | @ 17:00:00 | Stop: 2017-03-11

## 2017-03-10 MED ADMIN — ALBUMIN HUMAN 25 % IV SOLN: 25 g | INTRAVENOUS | @ 08:00:00 | Stop: 2017-03-12 | NDC 68516521601

## 2017-03-10 MED ADMIN — NOREPINEPHRINE 8, 16, 32 MG/250 ML DRIP: 10.34103.4 ug/min | INTRAVENOUS | @ 02:00:00 | Stop: 2017-03-10 | NDC 36000016210

## 2017-03-10 MED ADMIN — INSULIN ASPART 100 UNIT/ML SC SOPN: 3 mL | SUBCUTANEOUS | @ 17:00:00 | Stop: 2017-03-10 | NDC 00169633910

## 2017-03-10 MED ADMIN — DEXMEDETOMIDINE 4 MCG/ML DRIP: 20.68 ug/h | INTRAVENOUS | @ 08:00:00 | Stop: 2017-03-11 | NDC 55150020902

## 2017-03-10 MED ADMIN — MEROPENEM 100 ML NS IVPB: 1 g | INTRAVENOUS | @ 01:00:00 | Stop: 2017-03-11 | NDC 63323050830

## 2017-03-10 MED ADMIN — PROPOFOL INFUSION 10 MG/ML: 7755 ug/min | INTRAVENOUS | @ 04:00:00 | Stop: 2017-03-14

## 2017-03-10 MED ADMIN — NOREPINEPHRINE 8, 16, 32 MG/250 ML DRIP: 10.34103.4 ug/min | INTRAVENOUS | @ 04:00:00 | Stop: 2017-03-10

## 2017-03-10 MED ADMIN — INSULIN ASPART 100 UNIT/ML SC SOPN: 3 mL | SUBCUTANEOUS | @ 12:00:00 | Stop: 2017-03-10 | NDC 00169633910

## 2017-03-10 MED ADMIN — SENNOSIDES 8.6 MG PO TABS: 2 | ORAL | @ 16:00:00 | Stop: 2017-03-11 | NDC 00904652261

## 2017-03-10 MED ADMIN — FILGRASTIM-SNDZ 480 MCG/0.8ML IJ SOSY: 480 ug | SUBCUTANEOUS | @ 16:00:00 | Stop: 2017-03-14 | NDC 61314032610

## 2017-03-10 MED ADMIN — ALBUMIN HUMAN 25 % IV SOLN: 25 g | INTRAVENOUS | @ 19:00:00 | Stop: 2017-03-12 | NDC 68516521601

## 2017-03-10 MED ADMIN — PROPOFOL INFUSION 10 MG/ML: 7755 ug/min | INTRAVENOUS | @ 15:00:00 | Stop: 2017-03-14 | NDC 63323026965

## 2017-03-10 MED ADMIN — FLUCONAZOLE 200 MG PO TABS: 400 mg | OROGASTRIC | @ 16:00:00 | Stop: 2017-03-11 | NDC 68084073501

## 2017-03-10 MED ADMIN — PROPOFOL INFUSION 10 MG/ML: 7755 ug/min | INTRAVENOUS | @ 06:00:00 | Stop: 2017-03-14 | NDC 63323026965

## 2017-03-10 MED ADMIN — MEROPENEM 100 ML NS IVPB: 1 g | INTRAVENOUS | @ 13:00:00 | Stop: 2017-03-11 | NDC 63323050830

## 2017-03-10 NOTE — Progress Notes
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    -- Lines/Tubes --   ETT (size 8.60mm), PICC (RUE), A-line (right radial), NGT  -- Drips --  Fent, Precedex, propofol, levophed  -- Antimicrobials --  Levofloxacin, cefepime, Vanc  ???    IE/SUBJECTIVE:   IE:  - Overnight, pt febrile and tachycardic, FFWU, started Mero, Azithro, and DC'd cefepime, left on Levo.  .  Subjective:   - Intubated and sedated. Unable to assess  OBJECTIVE:   VITALS:  Temp:  [37.1 ???C (98.8 ???F)-38 ???C (100.4 ???F)] 37.1 ???C (98.8 ???F)  Heart Rate:  [89-119] 117  Resp:  [21-30] 30  Arterial Line BP (mmHg): (80-126)/(46-62) 89/49  MAP:  [60 mmHg-83 mmHg] 63 mmHg  FiO2 (%):  [60 %-100 %] 80 %  SpO2:  [82 %-92 %] 85 %  01/10 0701 - 01/11 0700  In: 3526.1 [I.V.:2086.1]  Out: 1880 [Urine:1780]     Wt Readings from Last 1 Encounters:   03/09/17 (!) 103.9 kg (229 lb 0.9 oz)     Recent Labs      03/09/17   1612  03/09/17   1142  03/09/17   0756  03/09/17   0401   GLUCOSEPOC  162*  136*  139*  157*   }  Oxygen Therapy  SpO2: (!) 85 %  O2 Device: ETT, Mechanical Ventilator  FiO2 (%): 80 %  O2 Flow Rate (L/min): 40 L/min     Vent Mode: VC  FiO2 (%):  [60 %-100 %] 80 %  Rate:  [28-30] 30  VT (mL):  [480 mL] 480 mL  PEEP (setting):  [10 cmH2O-14 cmH2O] 14 cmH2O    PHYSICAL EXAM:  General - Intubated/Sedated  Neuro - Sedated, +gag, pupils reactive  HEENT- oral ulcers on tongue w/minimal bleeding  Cardiovascular - RRR no m/r/g, unable to assess JVP d/t girth, 2+ radial pulses b/l  Lungs - No audible cuff leak, no wheezing anteriorly, mechanical breath sounds Skin - No visible rashes or lesions  Abdomen - Central obesity, soft, NT/ND  Extremeties - Warm and well-perfused. 1+ LE edema    LABORATORY DATA:   BMP  Recent Labs      03/09/17   1615  03/09/17   0401  03/09/17   0001  03/08/17   1618   NA  141  144  141  143   K  3.9  3.9  4.0  4.1   CL  99  100  100  100   CO2  19*  19*  17*  22   BUN  95*  93*  86*  88*   CREAT  2.80*  2.55*  2.65*  2.19*   CALCIUM  9.4  9.3  9.5  9.4 ~ 9.4   MG  2.3*  2.2*  2.2*  2.4*   PHOS  6.9*  6.8*   --   6.6*     CBC  Recent Labs  03/09/17   0401  03/08/17   0400  03/07/17   0408   WBC  6.31  6.75  8.75   HGB  9.8*  10.2*  10.5*   HCT  31.7*  30.9*  33.4*   MCV  94.6  92.0  92.0   PLT  184  187  184     Coags  Recent Labs      03/09/17   1144  03/09/17   0535  03/08/17   2145   APTT  80.2*  65.1*  122.9*     Blood gas  Recent Labs      03/09/17   1615   PHART  7.28*   PCO2ART  43*   PO2ART  67*   BICARBART  19.2*   BEART  -7*       Micro:    Microbiology:   12/31 BAL cx - neg or NGTD  12/28 Resp Cx Probable C.albicans  12/28 Blood Cx NTD  12/28 RVP neg  12/18 Resp Cx Neg (C albicans)  12/13 BAL Cx +mycobacteria  12/13 respiratory culture: candida, therwise negative so far; AFB smear negative x 2 (AFB cultures NTD)  12/13 legionella culture neg  Aspergillus Ag EIA - ???<0.50  RVP panel negative  Aspergillus fumigatus (M3) IgE - NEG   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  HIV - neg  Legionella Urinary Ag - neg  Chlamydia/gonorrhea PCR - neg  RPR nonreactive    IMAGING:   CXR (1/10):    ASSESSMENT & PLAN:   Ryan Shepard???is a 77 y.o.???male???with a history of COPD (on 3LNC), HFpEF, afib, HTN, DM2, PAD s/p femoral graft, prior prostate [place] who was admitted for hypoxemic respiratory failure requiring intubation. 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 CT head negative for brain mets.  - Heme-onc consulted, appreciate recs.   - Family meeting with wife (DPOA), daughter on phone, primary ICU team and Heme-Onc on 1/7 PM. Discussed benefits/adverse effects of chemotherapy and possibility that this may help decr tumor burden in lung as possible bridge to extubation. Family agreed to initiation.  - Cont chemo (caroplatin/etoposide) initiated 1/7 PM  - Decreased Allopurinol to 300mg  qday for renal dosing   - F/u G6PD testing in case rasburicase is needed  - TLS labs q12 (BMP, Phos, uric acid, LDH)  - Cont hep gtt  - per conversation w/heme-onc, don't expect full effect of chemo until 1 week, prognosis dependent on response to chemo, will talk about GOC at that point    RESP:  #Acute on chronic hypoxemic respiratory failure  Admitted from clinic requiring BiPAP in setting of decreased arterial PaO2 and desats to low 80s. Recently admitted w/ bronch showing chronic colonization with aspergillus. Has features c/f malignancy on imaging. Respiratory status continues to worsen with worsened xray. S/p upsized ETT on 1/1 w/ resolution of cuff leak. Initial c/f PE, CTA read no PE so d/c'd heparin gtt, LE dopplers negative for DVT. S/p Bronch on 1/10- demonstrated thin clear secretions.  - ARDS NET protocol  - Serial ABGs   - Malignancy tx, as below  -  Antibiotics, as below    #Chronic COPD  Increased sputum production x 2 months, slight wheezing and recently treated with steroids while hospitalized. Current presentation less c/w COPD exacerbation.  - Duonebs q6h  - Inhaled Flovent BID  - Empiric abx, as below  ???  CV:  #Hypovolemic shock in setting of sedation, improving  Patient requires significant sedation d/t air leak on cuff of ETT, patient changing position can increase air leak. Hypotension requiring pressors in setting of sedation but elevated lactate and signs of end-organ damage including rising Cr. Patient has leukocytosis and on empiric abx, no other signs of infection.  - A-line for accurate BP monitoring  - Pressors: cont levophed gtt  - S/p hydrocortisone and fludrocortisone (1/2-1/7)  - Trend lactate  - Goal MAPs > 65 on A-line    #?Pericardial effusion  Seen on bedside US on 1/10. Cards consulted today, formal TTE shows minimal pericardial effusion w/no tamponade physiology.    #HFpEF  Home dose lasix PO 40mg  BID. TTE shows hyperdynamic EF >75% and elevated PASP to .  - D/c albumin  - Start lasix gtt for goal negative fluid balance  - BID lytes, daily weights, strict I&O's     #Afib  Rate-controlled. CHADS2VaSC 3-4  - Hold home carvedilol 25 mg BID  - Hold home apixaban 5 mg BID  - hep gtt as above    RENAL:  #AKI - worsening  FEUrea this morning suggests intrinsic likely ATN.  - diuresis as above     NEURO:  #Intubated and sedated 2/2 hypoxemic respiratory failure  - Cont sedation with fentanyl/precedex/propofol  - Attempt sedation holiday as tolerated  - Repeat triglycerides q72h while on propofol    ID:  #Sepsis  Afebrile. Increased sputum production x 2 months otherwise no localizing signs of infection. Did receive course of methylprednisolone 12/18-12/23 on recent hospitalization. 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 +Mycobacteria 12/13 BAL resulted on 03/01/17, not nocardia as previously thought. On night of 1/10, febrile and tachycardic, and elevated procal 2.01.   - ID signed off, appreciate recs  - S/p IV Vanc  - S/p Azithromycin  - F/u urine histoplasma Ag (sendout lab), hold antifungal tx at this time  - S/p IV Zosyn  - S/p IV Bactrim 500mg  q8hr given that patient does not have pulmonary nocardia  - cont vancomycin (1/9-) for MRSA coverage  - cont mero (1/11-) for PsA coverage - start flucon 400mg  qday (1/11-) for empiric fungemic coverage  - DC levofloxacin (1/2- 1/11)  - DC IV cefepime q12h for GP coverage (1/6-1/10)  - DC azithro (1/10)  - F/u BCx and fungal Cx from 1/10    CHRONIC  #HTN  - Hold home BP meds in setting of shock: nifedipine 30mg  daily, losartan 100 mg daily, carvedilol 25 mg BID  ???  #Chronic???pain   Localized to lower back and lower extremities  - Hold home Norco 5-325 q6h PRN while sedated  ???  #GERD chronic/stable  - Pantoprazole PO 40 mg daily 12/31  ???  #Depression/Anxiety  Mood stable, no SI, no anxiety  - Hold home buspirone 15 mg BID, duloxetine DR 60 while sedated  ???  #PAD  s/p femoral-femoral graft 2009.  - [place] home clopidogrel 75 mg daily  ???  #HLD chronic/stable  - Hold home Pravastatin 40 mg qhs  ???  #DM2 with associated diabetic peripheral neuropathy  - ISS#3 now that TPN started  [facility] home gabapentin 800 mg TID   ???  F - cont TFs. q6h refeeding labs, supp K/Mg/Phos PRN  A - Sedated  S - Fent/precedex/propofol  T -  Hep gtt  H - 30 degrees  U - PPI BID  G - ISS#3, Accuchecks q4h  B - Senna qhs, suppository, miralax  I - ETT 8.7mm (upsided 1/1), PIV 22g R hand (12/28), CVC 3-Lumen L fem (1/1), A-line (12/31), OGT (12/31), PIV x 1 L hand    Disposition: ICU level care required for hypoxemic respiratory failure requiring intubation  ???  Code Status: Full Code  Contact:  Primary Emergency Contact: Trigo,Johnnie, Home Phone: [phone]    DPOA is daughter Rahmon Heigl daughter [phone]    DWA Dr. Rayfield Citizen, PGY-1  [phone]  Critical Care Attending Note:  ???  I have seen and examined and personally managed this patient who remains critically ill and high risk for clinical deterioration related to acute hypoxic resp failure in setting of underlying COPD, multifocal PNA, septic shock, acute renal failure, and new dx of small cell lung CA with concurrent malignant effusion and pericardial effusion.  ??? Remains very hypoxic on near max vent settings. Febrile and on pressors and abxs escalated today as above pending repeat cultures including BAL. Worsening renal function but also with volume overload so restarted lasix drip. Remains on anticoagulation for afib and continuing protective ventilation with ARDS net protocol. Prognosis guarded and may need HD soon - spoke with pt's daughter and wife at bedside at length. They are unsure of his wishes in this scenario but did state that he does not have a DNR and they assume that he would want everything done. We will also discuss his prognosis with oncology. Wife and daughter want to wake him up to talk to him about this but I did let them know that it would be difficult to do in the setting of his really high vent requirements. Formal echo obtained today and no evid of tamponade. Continuing all supportive ICU care and will continue to update family and discuss GOC.     Critical Care Time Personally Spent in Patient Care: 90 minutes.  ???  Pamlea Finder S. Regino Schultze

## 2017-03-10 NOTE — Progress Notes
Pharmaceutical Services  Vancomycin Dosing (Ongoing)    Patient Name: Ryan Shepard  MRN: [id number]  Age: 77 y.o.  Sex: male      Assessment  Indication Pneumonia  Goal trough level 15-20 mcg/mL  This patient is receiving dialysis: No    Plan  Ryan Shepard is a 77 y.o. male who has been referred to pharmacy for vancomycin dosing. The indication for vancomycin is Pneumonia. Based on the measured vancomycin level, recommend vancomycin 750mg  IVPB x 1 dose.  Repeat Scr is 2.8, recommend to continue to dose by level.  Pharmacy will continue to monitor the patient's clinical progress. The next vancomycin trough is scheduled for 03/10/17 at 20:00.    Laretta Bolster, PharmD, BCCCP 03/09/2017, 7:08 PM

## 2017-03-10 NOTE — Nursing Note
0730 Report from [place] MD Rounds: HD Line placement for CRRT.      1615 Dialysis CVC line placed by Dr. Tobias Alexander and Dr. [place]  Okay to use HD line.   Pt's wife arrived--updated status and plan of care. HD line placed and is waiting for consent for CRRT.   Dr. [name] arrived and spoke to the wife about his status and the need for dialysis. The wife wanted to review the ICU bundle, so they found that the patient had signed the ICU bundle on 02/26/17. The wife had requested to speak to the Renal Fellow in person in order to get consent for CRRT. Dr. Ardelle Park arrived and explained the purpose and side effects of CRRT. The wife consented and signed.     1715 CRRT start.     1845 I spoke to Dr. Tobias Alexander about pt's elevated HR--Continue to monitor, 12-Lead, Vasopressin

## 2017-03-10 NOTE — Other
Patients Clinical Goal:   Clinical Goal(s) for the Shift: 1. Monitor hemodynamic status. Keep MAP > 65 with titration of levophed.  2. Monitor for s/s of bleeding. Monitor APTT and titrate heparin gtt per protocol. 3. Monitor respiratory status. ARDS net protocol. ABGs q4 hours. 4. Increase TF rate by 10 mL q 8 hours for goal of 25mL/hour. Monitor resituals. 5. I/O goal net even. Lasix gtt at 57mL/hour as orderred. 6. Maintain comfort and safety. CHG bath.   Identify possible barriers to advancing the care plan: Hemodynamic instability, pulmonary instability  Stability of the patient: Unstable - high likelihood or risk of patient condition declining or worsening; patient condition declining or worsening    End of Shift Summary:   1. Hemodynamically labile. MAP maintained > 65 with titration of levophed. Current rate: 0.18 mcg/kg/min. Tmax 38.2C cooling measures applied. Tylenol not given, order parameters not met. Tmin 37.6C.  2. Trace bleeding noted from mouth. Oral care provided. Oncology mouthwash given per order. Last APTT within goal at 106.3. Next APTT due with AM labs on 1/13 per protocol.   3. ARDS net protocol maintained. Rate increased from 30 to 35. Patient now on 100% FiO2 and PEEP of 18 to maintain SpO2 goal of 88-92%.   ABGs q4 hours as ordered:     03/09/2017 22:42 03/10/2017 00:27 03/10/2017 03:34   pH 7.21 (LL) 7.25 (L) 7.30 (L)   pCO2 50 (H) 42 39   pO2 78 (L) 83 (L) 81 (L)   Bicarbonate 19.3 (L) 17.8 (L) 18.5 (L)   Base Excess -8 (L) -8 (L) -7 (L)   O2 Sat/Measured 92.7 (L) 95.2 95.0   Inspired O2 100% PEEP 18 90% FiO2 PEEP 14 90% PEEP 14     Patient dose not tolerate turning. Desaturated to 70's during CHG treatment.     4. Tolerating tube feed titration. Maximum residual 55mL. Current rate: 41mL/hour. Next increase to 46mL/hour due at 1200. Patient had one medium soft, formed BM during shift.     5. Net I/O: + 2.9L. One time dose of 5mg  PO metolazone given. Lasix gtt remains at 30 mg/hr. Creatinine continues to increase. 3.30 with AM labs.     6. Comfort and safety maintained throughout shift. CHG treatment provided.

## 2017-03-10 NOTE — Progress Notes
Hematology/Oncology Consult Progress Note   Patient name:  Ryan Shepard  MRN:  [id number]  DOB:  11-07-1940  Location: 4435/A  Date of service:  03/10/2017    Reason for consult: Newly-diagnosed metastatic small cell carcinoma  Primary service: MICU  PCP: Ryan Coil, MD    ID: 77 year-old former smoker (> 40 pk/yrs, quit ~20 years ago), COPD with chronic hypoxemic respiratory failure on 4L home oxygen, atrial fibrillation on chronic anticoagulation, HFpEF, HTN, T2DM, PAD and remote history of prostate cancer (s/p prostatectomy in 2000) who was admitted to the MICU on 12/28 with acute on chronic hypoxemic respiratory failure and as part of workup has been found to have metastatic small cell carcinoma, likely lung primary based on imaging, smoking history, and IHC. Started on palliative chemotherapy with carbo/etoposide 03/05/17.     Interval History/Subjective:  - Increased ventilator requirements overnight with FiO2 100%, PEEP 20.  - AKI continues to worsen. Cr 3.30 today.       PHYSICAL EXAM     Last Recorded Vital Signs:    03/10/17 0830   BP:    Pulse: 123   Resp: (!) 35   Temp: 37.5 ???C (99.5 ???F)   SpO2: (!) 89%       Intake/Output Summary (Last 24 hours) at 03/10/17 0924  Last data filed at 03/10/17 0800   Gross per 24 hour   Intake          3875.04 ml   Output             1020 ml   Net          2855.04 ml      General: Intubated and sedated. Diffuse anasarca.  HEENT: Sclera anciteric. Conjunctiva pale. MMM.  CV: RRR. No frank m/r/g appreciated.  Pulm: Mechanically ventilated with symmetric chest rise. Coarse breath sounds.  Abd: +BS. Soft, mildly distended.  Ext: Trace lower extremity edema. No asymmetry noted.     OBJECTIVE DATA   LABS  Lab Results   Component Value Date    WBC 11.37 (H) 03/10/2017    NEUTABS 5.25 03/09/2017    HGB 9.2 (L) 03/10/2017    HCT 28.4 (L) 03/10/2017    PLT 180 03/10/2017     Lab Results   Component Value Date    PT 16.2 (H) 01/27/2017    INR 1.4 02/07/2017 APTT 106.3 (H) 03/10/2017     Lab Results   Component Value Date    NA 140 03/10/2017    K 4.1 03/10/2017    CL 99 03/10/2017    CO2 16 (L) 03/10/2017    BUN 95 (H) 03/10/2017    CREAT 3.30 (H) 03/10/2017    GLUCOSE 179 (H) 03/10/2017    CALCIUM 9.4 03/10/2017    MG 2.1 (H) 03/10/2017    PHOS 7.3 (H) 03/10/2017    LDH 395 (H) 03/10/2017    URICACID 5.2 03/10/2017     Lab Results   Component Value Date    APTT 106.3 (H) 03/10/2017    PT 16.2 (H) 02/05/2017    INR 1.4 02/21/2017     Lab Results   Component Value Date    ALT 10 03/10/2017    AST 23 03/10/2017    BILITOT 0.6 03/10/2017    ALKPHOS 58 03/10/2017    ALBUMIN 4.4 03/10/2017    TOTPRO 6.7 03/10/2017     Pertinent Microbiology:   1/5 L pleural fluid cx NGF  1/3 Resp cx Probable C.albicans  1/2 BCx x2 NGF  1/2 UA neg  12/31 BAL cx - neg  12/28 Resp Cx Probable C.albicans  12/28 Blood Cx NTD  12/28 RVP neg  12/18 Resp cx Neg (C albicans)  12/13 BAL Nocardia cx: Rare beaded Gram positive rods --> fast-growing mycobacterium  12/13 AFB cx Mycobacterium species  12/13 BAL cx Neg  12/13 Respiratory culture: Candida, otherwise negative so far; AFB smear negative x 2  12/13 Legionella culture neg  Aspergillus Ag EIA - ???<0.50  RVP panel negative  Aspergillus fumigatus (M3) IgE - NEG   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  Pertinent Imaging:   02/16/17 CT chest with contrast   - Showed prominent mediastinal and hilar lymphadenopathy, with encasement of left CCA and partial encasement of right innominate and left subclavian.   - Airspace and nodular consolidation prominent in LUL and lingula. Moderate left pleural effusion, with multiple pleural nodules. Small pericardial effusion.  ???  02/16/17 CT A/P with contrast - Showed at least 4 hypervascular liver lesions within right hepatic lobe, largest measuring 5.0 x 3.6 cm.    - Moderate atherosclerotic calcifications throughout abdominal vessels and renovascular calcifications. Patent fem-fem bypass. Multifocal arterial stenosis in femoral arteries.     02/27/17 CT chest, angiogram   - Did not show evidence of PE.   - Redemonstration of airspace and nodular consolidation, most prominent in LUL and lingula, bilateral mediastinal and lymphadenopathy.   - Interval increase in moderate left pleural effusion. Multiple pleural nodules. Interval increase in small pericardial effusion.   ???  Pertinent Pathology:   02/26/17 BAL    - Cytology was negative for malignant cells. Flow cytometry did not show evidence of lymphoproliferative disease.   ???  03/01/17 Biopsy of left liver lesion   - Metastatic poorly differentiated neuroendocrine carcinoma. Synaptophysin/chromogranin positive. TTF-1 patchy positive. PSA negative. Ki67 > 90%. Flow cytometry was limited due to low quantity of recoverable cells, but no discrete monotypic B-cell population seen.       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 Palliative chemotherapy with carbo/etoposide was started on 03/05/17.  ???  #Extensive-stage small cell carcinoma, likely lung primary, with metastases to pleura and liver (CT head negative for brain mets)  #Risk for tumor lysis syndrome  I discussed with the patient's wife and daughter the diagnosis and the rationale for initiation of therapy, as well as the potential associated risks. The goal of therapy is palliative. We will see how he does with induction and hope that his respiratory status will improve such that he can be extubated. Further treatment will be discussed pending possible response and clinical course. I discussed that there are significant risks associated with chemotherapy, but that given his dire clinical situation, the potential benefit outweighs the risk. Risks include  but are not limited to tumor lysis syndrome, leading to renal failure with potential need for initiation of dialysis, worsening of fluid overload that may exacerbate respiratory status, and neutropenic sepsis.   ???  Carboplatin IV AUC of 4 once D1 (dose reduced due to critical illness) --> done  Etoposide IV 80 mg/m2 D1-D3 (dose reduced due to critical illness and AKI) --> done  G-CSF following completion of chemotherapy   ???  - Continue allopurinol.  - Contine G-CSF 480 mcg daily for prophylaxis.  - Reviewed diagnosis, treatment and prognosis with family. Discussed that we would be watching his clinical status in the next days to give [place] information regarding patient's prognosis.     Patient discussed with attending Dr. Clifton James. Please feel free to page [phone] with questions/concerns.  ???  Nydia Bouton  Hematology/Oncology Fellow  [phone]  ???

## 2017-03-10 NOTE — Other
Patients Clinical Goal:   Clinical Goal(s) for the Shift: 1.) Monitor hemodynamic stability. 2.) Monitor respiratory status on ARDS net. 3.)  Tolerate increase in TF rate. 4.) I/O goal   5.) Safety and comfort.   Identify possible barriers to advancing the care plan:   Stability of the patient: Unstable - high likelihood or risk of patient condition declining or worsening; patient condition declining or worsening    End of Shift Summary:     1.) Continues to be on Levophed gtt to maintain MAP > 65 requiring increase in dose, currently at 0.15 mcg/kg/min. NSR to ST on monitor with frequent PVCs, no evidence of active bleed. Low grade temp, cooling measures done.    2.) Episode of desaturation as low as 78-79 early on the shift requiring adjustment of vent settings per ARDS protocol. Peak pressures were also elevated to the 40s, MICU team aware. ABGs monitored with improvement in post vent setting changes. Minimal secretions from ET but moderate amount of thick white with trace blood oral secretions. Also noted for mouth sores and Oncology cocktail ordered by MICU intern.     3.) Tolerated increase in TF rate to 20 cc/hr with residuals > 100. No vomiting. No BM for this shift despite bowel regimen, will continue to monitor.     4.) Restarted on Lasix gtt per MD order to maintain I/O goal negative. Creatinine steadily increasing, MICU team aware.     5.) Fentanyl. Propofol gtt titrated to patient's comfort. Maximized safety, no need for restraints at this time.     - Dr. Regino Schultze updated patient's wife and daughter at bedside.       Ethics Protocol   Ethics Risk Conflict Score: (P) HIGH risk for ethical confict - is likely or very likely to develop under these conditions (more than 6 risk factors checked or ANY *critical risk factor checked)   Reason/Rationale (Why?): (P) Potential escalation of non-beneficial treatment, poor/unclear prognosis, moral distress in family/clinicians   Additional Info (specify): (Examples: Review the Goals of Care/Conference Note on (?), Ethics/Palliative Consult on (?); Family Conference set up with Attending/Team & primary decision makers on (?); important to be consistent and set limits with (?) about (?); Nurse, adult in place; and etc.)

## 2017-03-10 NOTE — Nursing Note
0630 Wasted 28mL of fentanyl gtt with Pia Mau, RN. Tubing expired, had to change bag with tubing.

## 2017-03-11 LAB — Basic Metabolic Panel
GFR ESTIMATE FOR NON-AFRICAN AMERICAN: 17 mg/dL (ref 0.60–1.30)
GFR ESTIMATE FOR NON-AFRICAN AMERICAN: 17 mg/dL (ref 0.60–1.30)
GLUCOSE: 225 mg/dL — ABNORMAL HIGH (ref 65–99)
POTASSIUM: 4.2 mmol/L (ref 3.6–5.3)
SODIUM: 135 mmol/L (ref 135–146)
SODIUM: 135 mmol/L (ref 135–146)
TOTAL CO2: 19 mmol/L — ABNORMAL LOW (ref 20–30)

## 2017-03-11 LAB — Calcium,Ionized
IONIZED CA++,CORRECTED: 1.04 mmol/L — ABNORMAL LOW (ref 1.09–1.29)
IONIZED CA++,CORRECTED: 1.05 mmol/L — ABNORMAL LOW (ref 1.09–1.29)
IONIZED CA++,CORRECTED: 1.03 mmol/L — ABNORMAL LOW (ref 1.09–1.29)
IONIZED CA++,CORRECTED: 1.12 mmol/L (ref 1.09–1.29)

## 2017-03-11 LAB — Blood Culture Fungal Detection
BLOOD CULTURE FUNGAL - FINAL: NEGATIVE
BLOOD CULTURE FUNGAL - FINAL: NEGATIVE

## 2017-03-11 LAB — Uric Acid
URIC ACID: 5 mg/dL (ref 3.4–8.8)
URIC ACID: 2.7 mg/dL — ABNORMAL LOW (ref 3.4–8.8)

## 2017-03-11 LAB — Glucose,POC
GLUCOSE,POC: 210 mg/dL — ABNORMAL HIGH (ref 65–99)
GLUCOSE,POC: 231 mg/dL — ABNORMAL HIGH (ref 65–99)
GLUCOSE,POC: 209 mg/dL — ABNORMAL HIGH (ref 65–99)
GLUCOSE,POC: 228 mg/dL — ABNORMAL HIGH (ref 65–99)
GLUCOSE,POC: 290 mg/dL — ABNORMAL HIGH (ref 65–99)
GLUCOSE,POC: 251 mg/dL — ABNORMAL HIGH (ref 65–99)
GLUCOSE,POC: 248 mg/dL — ABNORMAL HIGH (ref 65–99)
GLUCOSE,POC: 262 mg/dL — ABNORMAL HIGH (ref 65–99)
GLUCOSE,POC: 242 mg/dL — ABNORMAL HIGH (ref 65–99)

## 2017-03-11 LAB — Phosphorus
PHOSPHORUS: 7.3 mg/dL — ABNORMAL HIGH (ref 2.3–4.4)
PHOSPHORUS: 5.4 mg/dL — ABNORMAL HIGH (ref 2.3–4.4)
PHOSPHORUS: 4.1 mg/dL (ref 2.3–4.4)
PHOSPHORUS: 3.2 mg/dL (ref 2.3–4.4)

## 2017-03-11 LAB — Blood Gases, arterial
BICARBONATE: 17.7 mmol/L — ABNORMAL LOW (ref 22.0–26.0)
O2 SATURATION/MEASURED: 86.1 — ABNORMAL LOW (ref >=95.0)
PCO2: 45 mmHg — ABNORMAL HIGH (ref 38–42)
PCO2: 48 mmHg — ABNORMAL HIGH (ref 38–42)
PH: 7.28 — ABNORMAL LOW (ref 7.37–7.41)
PO2: 69 mmHg — ABNORMAL LOW (ref 98–118)

## 2017-03-11 LAB — APTT
APTT: 83.2 s — ABNORMAL HIGH (ref 24.4–36.2)
APTT: 89.7 s — ABNORMAL HIGH (ref 24.4–36.2)

## 2017-03-11 LAB — Magnesium
MAGNESIUM: 2.2 meq/L — ABNORMAL HIGH (ref 1.4–1.9)
MAGNESIUM: 2.1 meq/L — ABNORMAL HIGH (ref 1.4–1.9)
MAGNESIUM: 2.1 meq/L — ABNORMAL HIGH (ref 1.4–1.9)

## 2017-03-11 LAB — Lactate Dehydrogenase
LACTATE DEHYDROGENASE: 470 U/L — ABNORMAL HIGH (ref 125–256)
LACTATE DEHYDROGENASE: 369 U/L — ABNORMAL HIGH (ref 125–256)

## 2017-03-11 LAB — Blood Lactate: BLOOD LACTATE: 14 mg/dL (ref 5–25)

## 2017-03-11 LAB — HBs Ab Quant: HEP B SURFACE AB QUANT: <10 IU/L (ref <10)

## 2017-03-11 LAB — HBc Ab, Total: HEPATITIS B CORE AB,TOTAL: NONREACTIVE

## 2017-03-11 LAB — Blood Culture Detection
BLOOD CULTURE FINAL STATUS: NEGATIVE
BLOOD CULTURE FINAL STATUS: NEGATIVE
BLOOD CULTURE FINAL STATUS: NEGATIVE
BLOOD CULTURE FINAL STATUS: NEGATIVE

## 2017-03-11 LAB — CBC: MEAN CORPUSCULAR HEMOGLOBIN: 30.1 pg (ref 26.4–33.4)

## 2017-03-11 LAB — Cytology, Body Cavity Fluid

## 2017-03-11 LAB — Vancomycin,random
VANCOMYCIN,RANDOM: 12.8 ug/mL
VANCOMYCIN,RANDOM: 14 ug/mL

## 2017-03-11 LAB — Differential Automated: BASOPHIL PERCENT, AUTO: 0.9 (ref 1.80–6.90)

## 2017-03-11 LAB — UA,Dipstick: PH,URINE: <=5 (ref 5.0–8.0)

## 2017-03-11 LAB — TSH: TSH: 0.27 u[IU]/mL — ABNORMAL LOW (ref 0.3–4.7)

## 2017-03-11 LAB — HBs Ag: HEPATITIS B SURFACE ANTIGEN: NONREACTIVE

## 2017-03-11 LAB — Cortisol: CORTISOL: 10 ug/dL

## 2017-03-11 LAB — Hepatic Funct Panel: ASPARTATE AMINOTRANSFERASE: 36 U/L (ref 13–47)

## 2017-03-11 LAB — UA,Microscopic: WBCS HPF: 15 {cells}/[HPF] — ABNORMAL HIGH (ref 0–4)

## 2017-03-11 MED ADMIN — PROPOFOL INFUSION 10 MG/ML: 7755 ug/min | INTRAVENOUS | @ 17:00:00 | Stop: 2017-03-14 | NDC 63323026965

## 2017-03-11 MED ADMIN — INSULIN ASPART 100 UNIT/ML SC SOPN: 3 mL | SUBCUTANEOUS | @ 02:00:00 | Stop: 2017-03-11 | NDC 00169633910

## 2017-03-11 MED ADMIN — PRISMASATE BGK 4/2.5 DIALYSATE SOLUTION: 12000 mL/h | INTRAVENOUS_CENTRAL | @ 12:00:00 | Stop: 2017-03-13 | NDC 04444000018

## 2017-03-11 MED ADMIN — ALLOPURINOL 300 MG PO TABS: 300 mg | ORAL | @ 17:00:00 | Stop: 2017-03-13 | NDC 51079020620

## 2017-03-11 MED ADMIN — MEROPENEM 100 ML NS IVPB: 1 g | INTRAVENOUS | @ 10:00:00 | Stop: 2017-03-13 | NDC 63323050830

## 2017-03-11 MED ADMIN — VASOPRESSIN 20 UNITS, 40 UNITS/100 ML DRIP: 2.4 [IU]/h | INTRAVENOUS | @ 04:00:00 | Stop: 2017-03-13 | NDC 42023016425

## 2017-03-11 MED ADMIN — VASOPRESSIN 20 UNITS, 40 UNITS/100 ML DRIP: 2.4 [IU]/h | INTRAVENOUS | @ 22:00:00 | Stop: 2017-03-13

## 2017-03-11 MED ADMIN — NOREPINEPHRINE 8, 16, 32 MG/250 ML DRIP: 10.34103.4 ug/min | INTRAVENOUS | @ 13:00:00 | Stop: 2017-03-13 | NDC 36000016210

## 2017-03-11 MED ADMIN — INSULIN ASPART 100 UNIT/ML SC SOPN: 3 mL | SUBCUTANEOUS | @ 15:00:00 | Stop: 2017-03-11 | NDC 00169633910

## 2017-03-11 MED ADMIN — CALCIUM CHLORIDE 2 GM IN 250 ML CRRT: 270 mL | INTRAVENOUS | @ 02:00:00 | Stop: 2017-03-13 | NDC 76329330401

## 2017-03-11 MED ADMIN — MEROPENEM 100 ML NS IVPB: 1 g | INTRAVENOUS | @ 03:00:00 | Stop: 2017-03-13 | NDC 63323050830

## 2017-03-11 MED ADMIN — SODIUM CHLORIDE 0.9% IV SOLN (250 ML): 510 mL/h | INTRAVENOUS | @ 18:00:00 | Stop: 2017-03-14 | NDC 00338004902

## 2017-03-11 MED ADMIN — ALBUMIN HUMAN 25 % IV SOLN: 25 g | INTRAVENOUS | @ 13:00:00 | Stop: 2017-03-12 | NDC 68516521601

## 2017-03-11 MED ADMIN — NOREPINEPHRINE 8, 16, 32 MG/250 ML DRIP: 10.34103.4 ug/min | INTRAVENOUS | @ 10:00:00 | Stop: 2017-03-13

## 2017-03-11 MED ADMIN — SENNOSIDES 8.6 MG PO TABS: 2 | ORAL | @ 17:00:00 | Stop: 2017-03-13

## 2017-03-11 MED ADMIN — MEROPENEM 100 ML NS IVPB: 1 g | INTRAVENOUS | @ 18:00:00 | Stop: 2017-03-13 | NDC 63323050830

## 2017-03-11 MED ADMIN — PRISMASATE BGK 4/2.5 DIALYSATE SOLUTION: 12000 mL/h | INTRAVENOUS_CENTRAL | @ 16:00:00 | Stop: 2017-03-13 | NDC 04444000018

## 2017-03-11 MED ADMIN — PROPOFOL INFUSION 10 MG/ML: 7755 ug/min | INTRAVENOUS | @ 08:00:00 | Stop: 2017-03-14 | NDC 63323026965

## 2017-03-11 MED ADMIN — CALCIUM CHLORIDE 2 GM IN 250 ML CRRT: 270 mL | INTRAVENOUS | @ 15:00:00 | Stop: 2017-03-13

## 2017-03-11 MED ADMIN — INSULIN ASPART 100 UNIT/ML SC SOPN: 3 mL | SUBCUTANEOUS | @ 18:00:00 | Stop: 2017-03-11 | NDC 00169633910

## 2017-03-11 MED ADMIN — DEXMEDETOMIDINE 4 MCG/ML DRIP: 20.68 ug/h | INTRAVENOUS | @ 14:00:00 | Stop: 2017-03-11 | NDC 55150020902

## 2017-03-11 MED ADMIN — FLUDROCORTISONE ACETATE 0.1 MG PO TABS: .05 mg | OROGASTRIC | @ 23:00:00 | Stop: 2017-03-13 | NDC 00115703301

## 2017-03-11 MED ADMIN — CASPOFUNGIN 70 MG IVPB (LOADING DOSE): 70 mg | INTRAVENOUS | @ 23:00:00 | Stop: 2017-03-12 | NDC 00338004902

## 2017-03-11 MED ADMIN — THIAMINE HCL 100 MG PO TABS: 100 mg | ORAL | @ 17:00:00 | Stop: 2017-03-13

## 2017-03-11 MED ADMIN — DEXMEDETOMIDINE 4 MCG/ML DRIP: 20.68 ug/h | INTRAVENOUS | @ 05:00:00 | Stop: 2017-03-11 | NDC 55150020902

## 2017-03-11 MED ADMIN — NOREPINEPHRINE 8, 16, 32 MG/250 ML DRIP: 10.34103.4 ug/min | INTRAVENOUS | @ 04:00:00 | Stop: 2017-03-13

## 2017-03-11 MED ADMIN — PROPOFOL INFUSION 10 MG/ML: 7755 ug/min | INTRAVENOUS | @ 04:00:00 | Stop: 2017-03-14

## 2017-03-11 MED ADMIN — POLYETHYLENE GLYCOL 3350 17 G PO PACK: 34 g | ORAL | @ 17:00:00 | Stop: 2017-03-13

## 2017-03-11 MED ADMIN — NOREPINEPHRINE 8, 16, 32 MG/250 ML DRIP: 10.34103.4 ug/min | INTRAVENOUS | @ 09:00:00 | Stop: 2017-03-13

## 2017-03-11 MED ADMIN — HEPARIN 25,000 UNITS/250 ML 0.45% NACL RTU: 1250 [IU]/h | INTRAVENOUS | @ 01:00:00 | Stop: 2017-03-13

## 2017-03-11 MED ADMIN — PRISMASATE BGK 4/2.5 DIALYSATE SOLUTION: 12000 mL/h | INTRAVENOUS_CENTRAL | @ 08:00:00 | Stop: 2017-03-13

## 2017-03-11 MED ADMIN — PROPOFOL INFUSION 10 MG/ML: 7755 ug/min | INTRAVENOUS | @ 05:00:00 | Stop: 2017-03-14 | NDC 63323026965

## 2017-03-11 MED ADMIN — PRISMASATE BGK 4/2.5 DIALYSATE SOLUTION: 12000 mL/h | INTRAVENOUS_CENTRAL | @ 02:00:00 | Stop: 2017-03-13 | NDC 04444000018

## 2017-03-11 MED ADMIN — CHLORHEXIDINE GLUCONATE 0.12 % MT SOLN: 10 mL | OROMUCOSAL | @ 17:00:00 | Stop: 2017-03-13 | NDC 52376002110

## 2017-03-11 MED ADMIN — METOCLOPRAMIDE HCL 5 MG/ML IJ SOLN: 5 mg | INTRAVENOUS | @ 20:00:00 | Stop: 2017-03-13 | NDC 00409341401

## 2017-03-11 MED ADMIN — PRISMASATE BGK 4/2.5 DIALYSATE SOLUTION: 12000 mL/h | INTRAVENOUS_CENTRAL | @ 07:00:00 | Stop: 2017-03-13 | NDC 04444000018

## 2017-03-11 MED ADMIN — VASOPRESSIN 20 UNITS, 40 UNITS/100 ML DRIP: 2.4 [IU]/h | INTRAVENOUS | Stop: 2017-03-13 | NDC 42023016425

## 2017-03-11 MED ADMIN — ONCOLOGY MOUTHWASH: 30 mL | ORAL | @ 20:00:00 | Stop: 2017-03-13 | NDC 00067628212

## 2017-03-11 MED ADMIN — HEPARIN 25,000 UNITS/250 ML 0.45% NACL RTU: 1250 [IU]/h | INTRAVENOUS | @ 14:00:00 | Stop: 2017-03-13 | NDC 00409765062

## 2017-03-11 MED ADMIN — ALBUMIN HUMAN 25 % IV SOLN: 25 g | INTRAVENOUS | @ 20:00:00 | Stop: 2017-03-12 | NDC 68516521601

## 2017-03-11 MED ADMIN — FLUCONAZOLE 200 MG PO TABS: 400 mg | OROGASTRIC | @ 17:00:00 | Stop: 2017-03-11 | NDC 68084073501

## 2017-03-11 MED ADMIN — PROPOFOL INFUSION 10 MG/ML: 7755 ug/min | INTRAVENOUS | @ 09:00:00 | Stop: 2017-03-14 | NDC 63323026965

## 2017-03-11 MED ADMIN — ONCOLOGY MOUTHWASH: 30 mL | ORAL | @ 14:00:00 | Stop: 2017-03-13 | NDC 00067628212

## 2017-03-11 MED ADMIN — INSULIN ASPART 100 UNIT/ML SC SOPN: 3 mL | SUBCUTANEOUS | @ 08:00:00 | Stop: 2017-03-11 | NDC 00169633910

## 2017-03-11 MED ADMIN — ALBUMIN HUMAN 25 % IV SOLN: 25 g | INTRAVENOUS | @ 09:00:00 | Stop: 2017-03-12 | NDC 68516521601

## 2017-03-11 MED ADMIN — CALCIUM CHLORIDE 2 GM IN 250 ML CRRT: 270 mL | INTRAVENOUS | @ 04:00:00 | Stop: 2017-03-13

## 2017-03-11 MED ADMIN — SENNOSIDES 8.6 MG PO TABS: 2 | ORAL | @ 05:00:00 | Stop: 2017-03-11 | NDC 00904652261

## 2017-03-11 MED ADMIN — VANCOMYCIN 1 GM/200 ML RTU: 1 g | INTRAVENOUS | @ 06:00:00 | Stop: 2017-03-11 | NDC 00338355248

## 2017-03-11 MED ADMIN — DEXMEDETOMIDINE 4 MCG/ML DRIP: 20.68 ug/h | INTRAVENOUS | @ 04:00:00 | Stop: 2017-03-11

## 2017-03-11 MED ADMIN — ALBUMIN HUMAN 25 % IV SOLN: 25 g | INTRAVENOUS | @ 03:00:00 | Stop: 2017-03-12 | NDC 68516521601

## 2017-03-11 MED ADMIN — PRISMASATE BGK 4/2.5 DIALYSATE SOLUTION: 12000 mL/h | INTRAVENOUS_CENTRAL | @ 17:00:00 | Stop: 2017-03-13

## 2017-03-11 MED ADMIN — FENTANYL 2.5 MG/100 ML NS DRIP: 50 ug/h | INTRAVENOUS | @ 05:00:00 | Stop: 2017-03-13 | NDC 00409909461

## 2017-03-11 MED ADMIN — METOCLOPRAMIDE HCL 5 MG/ML IJ SOLN: 5 mg | INTRAVENOUS | @ 04:00:00 | Stop: 2017-03-13 | NDC 00409341401

## 2017-03-11 MED ADMIN — CHLORHEXIDINE GLUCONATE 0.12 % MT SOLN: 10 mL | OROMUCOSAL | @ 05:00:00 | Stop: 2017-03-13 | NDC 52376002110

## 2017-03-11 MED ADMIN — FENTANYL 2.5 MG/100 ML NS DRIP: 50 ug/h | INTRAVENOUS | @ 04:00:00 | Stop: 2017-03-13

## 2017-03-11 MED ADMIN — MEROPENEM 100 ML NS IVPB: 1 g | INTRAVENOUS | @ 02:00:00 | Stop: 2017-03-11

## 2017-03-11 MED ADMIN — PANTOPRAZOLE SODIUM 40 MG IV SOLR: 40 mg | INTRAVENOUS | @ 09:00:00 | Stop: 2017-03-13 | NDC 55150020210

## 2017-03-11 MED ADMIN — VASOPRESSIN 20 UNITS, 40 UNITS/100 ML DRIP: 2.4 [IU]/h | INTRAVENOUS | @ 14:00:00 | Stop: 2017-03-13 | NDC 42023016425

## 2017-03-11 MED ADMIN — FENTANYL 2.5 MG/100 ML NS DRIP: 50 ug/h | INTRAVENOUS | @ 22:00:00 | Stop: 2017-03-13 | NDC 00409909461

## 2017-03-11 MED ADMIN — HEPARIN 25,000 UNITS/250 ML 0.45% NACL RTU: 1250 [IU]/h | INTRAVENOUS | @ 04:00:00 | Stop: 2017-03-13

## 2017-03-11 MED ADMIN — METOCLOPRAMIDE HCL 5 MG/ML IJ SOLN: 5 mg | INTRAVENOUS | @ 14:00:00 | Stop: 2017-03-13

## 2017-03-11 MED ADMIN — NOREPINEPHRINE 8, 16, 32 MG/250 ML DRIP: 10.34103.4 ug/min | INTRAVENOUS | @ 17:00:00 | Stop: 2017-03-13

## 2017-03-11 MED ADMIN — PROPOFOL INFUSION 10 MG/ML: 7755 ug/min | INTRAVENOUS | @ 12:00:00 | Stop: 2017-03-14 | NDC 63323026965

## 2017-03-11 MED ADMIN — HYDROCORTISONE NA SUCCINATE PF 100 MG IJ SOLR: 50 mg | INTRAVENOUS | @ 23:00:00 | Stop: 2017-03-13 | NDC 00009001104

## 2017-03-11 MED ADMIN — POLYETHYLENE GLYCOL 3350 17 G PO PACK: 34 g | ORAL | @ 05:00:00 | Stop: 2017-03-13 | NDC 68084043098

## 2017-03-11 MED ADMIN — ONCOLOGY MOUTHWASH: 30 mL | ORAL | @ 04:00:00 | Stop: 2017-03-13 | NDC 00067628212

## 2017-03-11 MED ADMIN — VASOPRESSIN 20 UNITS, 40 UNITS/100 ML DRIP: 2.4 [IU]/h | INTRAVENOUS | @ 07:00:00 | Stop: 2017-03-13

## 2017-03-11 MED ADMIN — FILGRASTIM-SNDZ 480 MCG/0.8ML IJ SOSY: 480 ug | SUBCUTANEOUS | @ 17:00:00 | Stop: 2017-03-14 | NDC 61314032610

## 2017-03-11 MED ADMIN — HEPARIN SODIUM (PORCINE) 1000 UNIT/ML IJ SOLN: 3000 [IU] | @ 02:00:00 | Stop: 2017-03-11 | NDC 63323054011

## 2017-03-11 MED ADMIN — VANCOMYCIN 750 MG/150 ML RTU: 750 mg | INTRAVENOUS | @ 19:00:00 | Stop: 2017-03-13 | NDC 00338358048

## 2017-03-11 NOTE — Other
Patients Clinical Goal:   Clinical Goal(s) for the Shift: 1. Monitor hemodynamic status. Keep MAP > 65. 2. ARDS net protocol. Maintain SpO2 88-92%. 3. CRRT. Net I/O goal -3L. Labs q6 as ordered. 4. Monitor for s/s of bleeding. Heparin gtt per protocol. 5. Maintain comfort and safety. CHG bath.    Identify possible barriers to advancing the care plan: Heomodynamic instability, pulmonary instability  Stability of the patient: Unstable - high likelihood or risk of patient condition declining or worsening; patient condition declining or worsening    End of Shift Summary:   1. Patient hemodynamically labile overnight with increasing pressor requirements. Current drips: Vasopressin at 2 units/hour. Levophed at 0.48 mcg/kg/min.  2. Patient remains on ARDS net protocol. SpO2 noted not to correlate with ABG results.   ABG trend overnight:    pH 7.24 (LL) 7.21 (LL) 7.25 (L)   pCO2 45 (H) 48 (H) 48 (H)   pO2 79 (L) 73 (L) 69 (L)   Bicarbonate 18.3 (L) 18.4 (L) 20.2 (L)   Base Excess -8 (L) -9 (L) -6 (L)   O2 Sat/Measured 93.9 (L) 92.0 (L) 91.5 (L)   Inspired O2 80% FiO2 PEEP 14 80% FiO2 PEEP 14 80% PEEP 14     Current vent settings:    O2 Device: ETT;Mechanical Ventilator  Vent Mode: VC  FiO2 (%):  [80 %-100 %] 80 %  Rate:  [35] 35  PIP (cmH2O):  [39 cmH2O-46 cmH2O] 40 cmH2O  PEEP (setting):  [14 cmH2O-18 cmH2O] 14 cmH2O  VT (mL):  [400 mL-480 mL] 480 mL  Mean PAW (cmH2O):  [25 cmH2O-28 cmH2O] 25 cmH2O     3. I/O goal not met. Unable to pull fluid with CRRT due to hypotension. Rate increased from 3L/hour to 4L/hour for metabolic acidosis. Labs sent q6 hours as ordered. q6 hour VBGs not sent due to q4 ABG order.     4. Last APTT not within goal. Heparin gtt increased from 1650 units/hour to 1850 units/hour. Next APTT due at 1130. No s/s of bleeding noted.    5. Comfort and safety maintained throughout shift. CHG bath given with three person assist. All linens changed. Peri, foley and skin care provided. Wife at bedside for duration of shift - updated on plan of care.     Patient had one large loose BM. Flexiseal inserted.

## 2017-03-11 NOTE — Progress Notes
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    -- Lines/Tubes --   ETT (size 8.64mm), PICC (RUE), A-line (right radial), NGT  -- Drips --  Fent, Precedex, propofol, levophed  -- Antimicrobials --  Levofloxacin, cefepime, Vanc  ???    IE/SUBJECTIVE:   IE:  - MAPs dipped to 40s overnight, increased levo and fluid pulling from CRRT, added vaso and repeated FFWU  .  Subjective:   - Intubated and sedated. Unable to assess  OBJECTIVE:   VITALS:  Temp:  [35.9 ???C (96.6 ???F)-37.7 ???C (99.9 ???F)] 36 ???C (96.8 ???F)  Heart Rate:  [99-136] 126  Resp:  [31-35] 35  BP: (84-143)/(51-74) 119/68  NBP Mean:  [63-92] 83  Arterial Line BP (mmHg): (62-161)/(42-77) 85/52  MAP:  [51 mmHg-104 mmHg] 64 mmHg  FiO2 (%):  [80 %-100 %] 80 %  SpO2:  [85 %-97 %] 85 %  01/12 0701 - 01/13 0700  In: 3625.6 [I.V.:2045.6]  Out: 1286 [Urine:353]     Wt Readings from Last 1 Encounters:   03/11/17 (!) 115.6 kg (254 lb 13.6 oz)     Recent Labs      03/11/17   0342  03/10/17   2335  03/10/17   2209  03/10/17   1942   GLUCOSEPOC  251*  290*  228*  209*   }  Oxygen Therapy  SpO2: (!) 85 %  O2 Device: ETT, Mechanical Ventilator  FiO2 (%): 80 %  O2 Flow Rate (L/min): 40 L/min     Vent Mode: VC  FiO2 (%):  [80 %-100 %] 80 %  Rate:  [35] 35  VT (mL):  [400 mL-480 mL] 480 mL  PEEP (setting):  [14 cmH2O-18 cmH2O] 14 cmH2O    PHYSICAL EXAM:  General - Intubated/Sedated  Neuro - Sedated, +gag, pupils reactive  HEENT- oral ulcers on tongue w/minimal bleeding  Cardiovascular - RRR no m/r/g, unable to assess JVP d/t girth, 2+ radial pulses b/l Lungs - No audible cuff leak, no wheezing anteriorly, mechanical breath sounds  Skin - No visible rashes or lesions  Abdomen - Central obesity, soft, NT/ND  Extremeties - Warm and well-perfused. 1+ LE edema    LABORATORY DATA:   BMP  Recent Labs      03/11/17   0336  03/10/17   2156  03/10/17   1736  03/10/17   0334   NA  139  138  140  140   K  4.2  4.0  4.2  4.1   CL  100  98  100  99   CO2  19*  19*  17*  16*   BUN  60*  83*  104*  95*   CREAT  2.09*  2.60*  3.27*  3.30*   CALCIUM  8.8  9.2  8.9  9.4   MG  2.1*  2.2*   --   2.1*   PHOS  4.1  5.4*  7.3*  7.3*  CBC  Recent Labs      03/11/17   0336  03/10/17   0334  03/09/17   0401   WBC  9.06  11.37*  6.31   HGB  8.9*  9.2*  9.8*   HCT  27.0*  28.4*  31.7*   MCV  91.2  91.9  94.6   PLT  136*  180  184     Coags  Recent Labs      03/11/17   0336  03/10/17   0334  03/10/17   0027   APTT  83.2*  106.3*  98.6*     Blood gas  Recent Labs      03/11/17   0336   PHART  7.25*   PCO2ART  48*   PO2ART  69*   BICARBART  20.2*   BEART  -6*       Micro:    Microbiology:   12/31 BAL cx - neg or NGTD  12/28 Resp Cx Probable C.albicans  12/28 Blood Cx NTD  12/28 RVP neg  12/18 Resp Cx Neg (C albicans)  12/13 BAL Cx +mycobacteria  12/13 respiratory culture: candida, therwise negative so far; AFB smear negative x 2 (AFB cultures NTD)  12/13 legionella culture neg  Aspergillus Ag EIA - ???<0.50  RVP panel negative  Aspergillus fumigatus (M3) IgE - NEG   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  HIV - neg  Legionella Urinary Ag - neg  Chlamydia/gonorrhea PCR - neg  RPR nonreactive    IMAGING:   CXR (1/10):    ASSESSMENT & PLAN:   Ryan Shepard???is a 77 y.o.???male???with a history of COPD (on 3LNC), HFpEF, afib, HTN, DM2, PAD s/p femoral graft, prior prostate [place] who was admitted for hypoxemic respiratory failure requiring intubation.    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 CT head negative for brain mets.  - Heme-onc consulted, appreciate recs.   - Family meeting with wife (DPOA), daughter on phone, primary ICU team and Heme-Onc on 1/7 PM. Discussed benefits/adverse effects of chemotherapy and possibility that this may help decr tumor burden in lung as possible bridge to extubation. Family agreed to initiation.  - Cont chemo (caroplatin/etoposide) initiated 1/7 PM  - Cont Allopurinol 300mg  qday (renal dosing)   - F/u G6PD testing in case rasburicase is needed  - TLS labs q12 (BMP, Phos, uric acid, LDH)  - Cont hep gtt  - per conversation w/heme-onc, don't expect full effect of chemo until 1 week, prognosis dependent on response to chemo, will talk about GOC at that point    RESP:  #Acute on chronic hypoxemic respiratory failure  Admitted from clinic requiring BiPAP in setting of decreased arterial PaO2 and desats to low 80s. Recently admitted w/ bronch showing chronic colonization with aspergillus. Has features c/f malignancy on imaging. Respiratory status continues to worsen with worsened xray. S/p upsized ETT on 1/1 w/ resolution of cuff leak. Initial c/f PE, CTA read no PE so d/c'd heparin gtt, LE dopplers negative for DVT. S/p Bronch on 1/10- demonstrated thin clear secretions.  - ARDS NET protocol  -  Serial ABGs   - Malignancy tx, as below  - Antibiotics, as below    #Chronic COPD  Increased sputum production x 2 months, slight wheezing and recently treated with steroids while hospitalized. Current presentation less c/w COPD exacerbation.  - Duonebs q6h  - Inhaled Flovent BID  - Empiric abx, as below  ???  CV: #Hypovolemic shock in setting of sedation, improving  Patient requires significant sedation d/t air leak on cuff of ETT, patient changing position can increase air leak. Hypotension requiring pressors in setting of sedation but elevated lactate and signs of end-organ damage including rising Cr. Patient has leukocytosis and on empiric abx, no other signs of infection.  - A-line for accurate BP monitoring  - Pressors: cont levophed gtt and vaso  - start hydrocortisone 50mg  q6h  - start florinef 0.05mg  qday  - S/p hydrocortisone and fludrocortisone (1/2-1/7)  - Trend lactate  - Goal MAPs > 65 on A-line    #Pericardial effusion: Small, stable    #HFpEF  Home dose lasix PO 40mg  BID. TTE shows hyperdynamic EF >75% and elevated PASP to .  - D/c albumin  - Start CRRT as below, goal -3L  - BID lytes, daily weights, strict I&O's     #Afib  Rate-controlled. CHADS2VaSC 3-4  - Hold home carvedilol 25 mg BID  - Hold home apixaban 5 mg BID  - hep gtt as above    RENAL:  #Acute renal failure:  - Start CRRT, goal -3L    NEURO:  #Intubated and sedated 2/2 hypoxemic respiratory failure  - Cont sedation with fentanyl/precedex/propofol  - Attempt sedation holiday as tolerated  - Repeat triglycerides q72h while on propofol    ID:  #Sepsis  Afebrile. Increased sputum production x 2 months otherwise no localizing signs of infection. Did receive course of methylprednisolone 12/18-12/23 on recent hospitalization. 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 +Mycobacteria 12/13 BAL resulted on 03/01/17, not nocardia as previously thought. On night of 1/10, febrile and tachycardic, and elevated procal 2.01.   - ID signed off, appreciate recs  - S/p IV Vanc  - S/p Azithromycin  - F/u urine histoplasma Ag (sendout lab), hold antifungal tx at this time  - S/p IV Zosyn  - S/p IV Bactrim 500mg  q8hr given that patient does not have pulmonary nocardia  - cont vancomycin (1/9-) for MRSA coverage - cont mero (1/11-) for PsA coverage  - start amikacin 10mg /kg x1 dose  - start caspofungin 70mg  x1 today, then 50mg  qday   - DC flucon 400mg  qday (1/11-) for empiric fungemic coverage  - S/p levofloxacin (1/2- 1/11)  - S/p IV cefepime q12h for GP coverage (1/6-1/10)  - S/p azithro (1/10)  - F/u BCx and fungal Cx from 1/10    CHRONIC  #HTN  - Hold home BP meds in setting of shock: nifedipine 30mg  daily, losartan 100 mg daily, carvedilol 25 mg BID  ???  #Chronic???pain   Localized to lower back and lower extremities  - Hold home Norco 5-325 q6h PRN while sedated  ???  #GERD chronic/stable  - Pantoprazole PO 40 mg daily 12/31  ???  #Depression/Anxiety  Mood stable, no SI, no anxiety  - Hold home buspirone 15 mg BID, duloxetine DR 60 while sedated  ???  #PAD  s/p femoral-femoral graft 2009.  - Hole home clopidogrel 75 mg daily  ???  #HLD chronic/stable  - Hold home Pravastatin 40 mg qhs  ???  #DM2 with associated diabetic peripheral neuropathy  -  start insulin gtt today  - Hole home gabapentin 800 mg TID   ???  F - cont TFs. q6h refeeding labs, supp K/Mg/Phos PRN  A - Sedated  S - Fent/precedex/propofol  T -  Hep gtt  H - 30 degrees  U - PPI BID  G - ISS#3, Accuchecks q4h  B - Senna qhs, suppository, miralax  I - ETT 8.58mm (upsided 1/1), PIV 22g R hand (12/28), CVC 3-Lumen L fem (1/1), A-line (12/31), OGT (12/31), PIV x 1 L hand    Disposition: ICU level care required for hypoxemic respiratory failure requiring intubation  ???  Code Status: Full Code  Contact:  Primary Emergency Contact: Ryan Shepard,Ryan Shepard, Home Phone: [phone]    DPOA is daughter Ryan Shepard daughter [phone]    DWA Dr. Dot Been Refugio PGY1    Critical Care Attending Addendum:    I have seen and examined and personally managed this patient who remains critically ill and high risk for clinical deterioration related to acute hypoxic resp failure in setting of underlying COPD, multifocal PNA, septic shock, acute renal failure with volume overload, and new dx of small cell lung CA with concurrent malignant effusion and pericardial effusion.  ???  Remains very hypoxic on near max vent settings and worsening shock now on 2 pressors with difficulty pulling fluid on CRRT because of labile BP. Broadening antimicrobial coverage and adding stress dose steroids. Remains on anticoagulation for afib and continuing???protective ventilation with ARDS net protocol. Prognosis guarded and discussed with family today as well as oncology who feel that chemo should start working around now (~1 wk after administration) - will plan on a more formal family meeting tomorrow to discuss GOC.    Critical Care Time Personally Spent in Patient Care: 78 minutes exclusive of procedures.   ???  Kyrianna Barletta S. Regino Schultze

## 2017-03-11 NOTE — Progress Notes
Pharmaceutical Services  Vancomycin Dosing (Ongoing)    Patient Name: Ryan Shepard  MRN: [id number]  Age: 77 y.o.  Sex: male      Pharmacokinetic Parameters:  Measured Vanco (random) Level = 12.8 mg/L on 1/12 at 1943    Assessment:  Current Vancomycin Regimen:  vancomycin 750mg  IV x1 received on 1/11 @2023   Vancomycin Indication:  PNA  Goal Trough Level:  15-20 mcg/mL  Vanco Level Assessment: subtherapeutic  Dialysis:  Yes, CRRT @3L /hr    Plan:   Give vancomycin 1gm IV x1 now.   Next vancomycin level (12-hr) ordered for 1/13 at 0900.   Pharmacy will continue to monitor the patient's clinical progress daily.      Alyson Reedy, PharmD, 03/10/2017, 9:00 PM

## 2017-03-11 NOTE — Consults
NUTRITION ASSESSMENT (Adult)    Admit Date: 03/25/17     Date of Birth: 1940-10-01 Gender: male MRN: [id number]     Date of Assessment: 03/11/2017   Status: Reassessment   Indication: Respiratory failure on ventilator, Dialysis, Enteral nutrition   Subjective: Chart reviewed. Per RN, pt is tolerating TF. Pt has +2 edema.   CRRT started on 03/10/17.    Problems: Active Problems:    Dyspnea POA: Yes    Acute hypoxemic respiratory failure (HCC/RAF) POA: Yes    Small cell carcinoma of lung (HCC/RAF) POA: Unknown       Past Medical History:   Diagnosis Date   ??? Cancer (HCC/RAF)    ??? Diabetes (HCC/RAF)    ??? GERD (gastroesophageal reflux disease)    ??? Hypercholesteremia    ??? Hypertension    ??? PAD (peripheral artery disease) (HCC/RAF)    ??? 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 Prostate disease    ??? Scoliosis     adolesent   ??? Vascular disease     Past Surgical History:   Procedure Laterality Date   ??? BACK SURGERY     ??? BILATERAL FEMORAL ARTERY EXPLORATION  2009   ??? BLADDER SURGERY  1999   ??? PROSTATE SURGERY  2000           Data   Intake/Outputs: I/O last 2 completed shifts:  In: 4263.2 [I.V.:2373.2; Other:100; NG/GT:1620; IV Piggyback:170]  Out: 1331 [Urine:398; Other:933]   UF:  Last BM 1/13 x 1    Pertinent Medications:  Scheduled Meds:  ??? albumin  25 g Intravenous Q6H   ??? allopurinol  300 mg Oral Daily   ??? chlorhexidine  10 mL Mouth/Throat BID   ??? filgrastim / filgrastim-sndz SC  480 mcg Subcutaneous Daily   ??? fluconazole  400 mg Per OG Tube Daily   ??? meropenem IV  1 g Intravenous Q8H   ??? metoclopramide  5 mg IV Push TID   ??? oncology mouthwash  30 mL Swish & Spit TID   ??? pantoprazole  40 mg IV Push Q24H   ??? polyethylene glycol  34 g Oral BID   ??? senna  2 tablet Oral BID   ??? thiamine  100 mg Oral Daily     Continuous Infusions:  ??? CRRT calcium chloride 2 g in 250 mL drip 10 mL/hr at 03/11/17 0459   ??? dexmedetomidine 4 mcg/mL drip 1 mcg/kg/hr (03/11/17 0900) ??? fentaNYL 2.5 mg/100 mL drip 150 mcg/hr (03/11/17 0900)   ??? heparin 25,000 units/250 mL drip RTU 1,850 Units/hr (03/11/17 0900)   ??? norepinephrine drip 0.5 mcg/kg/min (03/11/17 0900)   ??? PrismaSate BGK 4/2.5 5,000 mL/hr (03/11/17 0840)   ??? propofol drip 40 mcg/kg/min (03/11/17 0900)   ??? sodium chloride 5 mL/hr (02/26/17 0041)   ??? sodium chloride 5 mL/hr (02/27/17 1610)   ??? sodium chloride     ??? vasopressin drip 2 Units/hr (03/11/17 0900)     PRN Meds:.acetaminophen, albuterol, bisacodyl, insulin aspart **AND** dextrose, heparin, ipratropium, sodium chloride    FDI Target Drugs: No      Pertinent Labs:   Recent Labs      03/11/17   0336   NA  139   K  4.2   BUN  60*   CREAT  2.09*   GLUCOSE  254*   MG  2.1*   CALCIUM  8.8   ICALCOR  1.03*   PHOS  4.1   ALBUMIN  4.6   HGB  8.9*   HCT  27.0*   ALKPHOS  78          Accu-Chek:   Recent Labs      03/10/17   1942  03/10/17   2209  03/10/17   2335  03/11/17   0342  03/11/17   0634  03/11/17   0839   GLUCOSEPOC  209*  228*  290*  251*  248*  262*       Respiratory Status / O2 Device: ETT, Mechanical Ventilator    Pressure Injury: none    Additional data:    77yo male admitted with resp failure  Hx: COPD (on 4LNC), HFpEF, afib, HTN, DM2, PAD s/p femoral graft, prior prostate CA    12/31 - intubated  1/2 - concern for malignancy per MD  1/12- CRRT initiated     Diet Info   ??? Allergies:   Patient has no known allergies.  ??? Cultural/Ethnic/Religious/Other Food Preferences:  Unable to Assess     ??? Nutrition prior to admit:  Unable to assess.  ??? Current diet order:     Diets/Supplements/Feeds   Diet    Diet NPO Except for: Medications     Start Date/Time: 02/28/17 1400     Number of Occurrences:  Until Specified   Nourishments    Diet tube feeding non bolus Oral/G-Tube; Peptamen 1.5 w/Prebio Initiate at 10 ml/hr and advance 33ml/hr q 8 hrs as tolerated to goal rate 63ml/hr.     Start Date/Time: 03/09/17 1130     Number of Occurrences:  Until Specified Order Comments: Initiate at 10 ml/hr and advance 33ml/hr q 8 hrs as tolerated to goal rate 105ml/hr.       ??? PO % consumed: NPO  ??? Parenteral Nutrition: none  ??? Enteral Nutrition: Peptamen 1.5 @ 50ml/h providing 1635ml/day, 2520kcals/day, 114.3g pro/kg.   ??? Other caloric sources: propofol at 24.77ml/h providing 665kcals/day.    Anthropometrics:    Height: 185.4 cm (6' 0.99'')  Admit Weight: 97.1 kg (214 lb) (2017-03-02 1134) Last 5 recorded weights:  Weights 03/05/2017 03/06/2017 03/09/2017 03/10/2017 03/11/2017   Weight 104.7 kg 103.8 kg 103.9 kg 111.3 kg 115.6 kg            IBW: 83.5 kg (184 lb)  % Ideal Body Weight: 125 %  BMI (Calculated): 33.7    Usual Weight:  (n/a-patient intubated)           Estimated Needs   Using admit wt: 97.1kg  2427-2913 kcal (25-30cal/kg)  126-145g pro (1.3-1.5g/kg)     Diet Education   No diet education needs at this time            Malnutrition Assessment   Inflammation: Mild-Moderate/Chronic   Energy Intake: Unable to assess   Weight Loss: > 5% in 1 month    Nutrition-Focused Physical Exam: 02/28/2017  Not performed: Deferred to next visit.     Nutrition-Focused Physical Exam: 03/05/2017  Orbital: Moderate, Upper Arm: Moderate, Thoracic/Lumbar: Unable to assess  Subcutaneous Fat Loss: Moderate    Temple: Severe, Clavicle: Severe, Deltoid: Severe  Scapula: Unable to assess, Interosseous: None, Anterior Thigh: Severe, Patellar Region: Severe, Posterior Calf: Severe  Muscle Loss: Severe    Patient meets criteria for: Severe Malnutrition    Nutrition Assessment   1. Nutritional Status-      Reviewed old records for weight history.      Loss of ~16kg over ~ 85yrs (gradual)  Loss of ~ 6kg (6%) in less than one month pta noted.       Currently w/ pt gain of 14.7kg over the past 13 days 2/2 fluid status.      NFPE shows severe muscle loss and moderate fat loss (per previous RD assessment); currently edematous.         2. Nutrition-       NPO. TF initiated on 1/6, receiving low volumes (~10-18% of goal) until it was advanced on 03/10/17.       TF currently at goal volume, but exceeding needs w/ propofol.      Receiving propofol, providing ~26% of kcal needs currently. TG WNL.       Now on CRRT w/ TF at 72ml/h.     3. Gastrointestinal-      Last stool this AM; on bowel regimen. Tolerating TF.     4. Glucose Control-       Hx DM. No A1c. Elevated BG; on novolog q6h.     5. Labs-      Recent Vit B1 WNL; on 100mg  vit B1 daily. K and phos WNL.      Recommendations / Care Plan      1) For now, while on CRRT and K and phos WNL, continue TF w/ Peptamen 1.5 w/ Prebio.   ??? Decrease TF w/ Peptamen 1.5 to 65ml/h while propofol at 13-73ml/h.   ??? If propofol decreased to less than 37ml/h, then increase Peptamen 1.5 w/ Prebio to a goal rate of 43ml/h ( , 2520kcals, 114g pro).  ??? If K or phos elevated, then change formula to Nepro. While propofol at 13-26ml/h, goal rate is 45ml/h. With propofol at <5ml/h, goal rate is 56ml/h ( , 2592kcals, 117g pro).   2)  Monitor daily weights.    3) Consider adding long-acting insulin while on continuous tube feedings.   4) Check HbA1c.   5) Continue thiamine supplementation while on CRRT.   6) If MAP <60, or pt becomes distended and showing signs of TF intolerance d/t decreased perfusion to the GI tract, then hold TF until resolved.       Author:  Marcella Dubs, RD covering for Valene Bors, MS, RD pg. [phone]  03/11/2017 9:17 AM

## 2017-03-11 NOTE — Nursing Note
1730 Verified consent and orders with Primary RN. CVVHD tx started. Pressures within normal limits.

## 2017-03-11 NOTE — Progress Notes
INPATIENT RENAL CONSULTATION    Date of Service: 03/11/2017  Patient Name: Ryan Shepard  MRN: [id number]    Referring Physician: Suzzette Righter., MD    Consult Attending Physician:  Bobbye Charleston    Consult Fellow: Vincente Liberty, MD    Reason for Consultation:  Management of AKI and hemodialysis.    History of Present Illness:  77 y.o. 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 Patient Started on palliative chemotherapy with carbo/etoposide 03/05/17. However, pt developed AKI. Baseline Creat is around 1.0 but it was peaked to 3.3 on the day of consult in 1/12/209.     Interval event:   1/13: clotted filter once. I/O 4202/428 ml     Past Medical History:   Diagnosis Date   ??? Cancer (HCC/RAF)    ??? Diabetes (HCC/RAF)    ??? GERD (gastroesophageal reflux disease)    ??? Hypercholesteremia    ??? Hypertension    ??? PAD (peripheral artery disease) (HCC/RAF)    ??? 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 Prostate disease    ??? Scoliosis     adolesent   ??? Vascular disease        Past Surgical History:   Procedure Laterality Date   ??? BACK SURGERY     ??? BILATERAL FEMORAL ARTERY EXPLORATION  2009   ??? BLADDER SURGERY  1999   ??? PROSTATE SURGERY  2000       No Known Allergies    Current Facility-Administered Medications   Medication Dose Route Frequency   ??? acetaminophen tab 650 mg  650 mg Oral Q4H PRN   ??? albumin 25% inj 25 g  25 g Intravenous Q6H   ??? albuterol (2.5 mg/0.5 mL) 0.5% nebu soln 2.5 mg  2.5 mg Nebulization Q6H PRN   ??? allopurinol tab 300 mg  300 mg Oral Daily   ??? amikacin 950 mg in dextrose 5% 250 mL IVPB  10 mg/kg (Adjusted) Intravenous Once   ??? bisacodyl supp 10 mg  10 mg Rectal Daily PRN ??? calcium chloride 2 g in sodium chloride 0.9% 270 mL CRRT drip   Intravenous Continuous   ??? [START ON 03/12/2017] caspofungin 50 mg in sodium chloride 0.9% 250 mL IVPB  50 mg Intravenous Q24H   ??? caspofungin 70 mg in sodium chloride 0.9% 250 mL IVPB  70 mg Intravenous Once   ??? chlorhexidine 0.12% soln 10 mL  10 mL Mouth/Throat BID   ??? insulin aspart (NovoLOG) 100 units/mL inj pen   Subcutaneous Q6H PRN    And   ??? dextrose 50% inj 25 g  25 g IV Push PRN   ??? fentaNYL (PF) 2,500 mcg in sodium chloride 0.9% 100 mL drip  50 mcg/hr Intravenous Continuous   ??? filgrastim-sndz 480 mcg/0.8 mL inj 480 mcg  480 mcg Subcutaneous Daily   ??? fludrocortisone tab 0.05 mg  0.05 mg Per OG Tube Daily   ??? heparin 1000 unit/mL inj 1,000 Units  1,000 Units Intracatheter PRN   ??? heparin 25,000 units in 0.45% NaCl 250 mL drip RTU  1,250 Units/hr Intravenous Continuous   ??? hydrocortisone inj 50 mg  50 mg IV Push Q6H   ???  ipratropium 0.02% nebu soln 500 mcg  0.5 mg Nebulization Q6H PRN   ??? meropenem 1 g in sodium chloride 0.9% 120 mL IVPB  1 g Intravenous Q8H   ??? metoclopramide 5 mg/mL inj 5 mg  5 mg IV Push TID   ??? norepinephrine 32 mg in sodium chloride 0.9% 250 mL drip  0.1-1 mcg/kg/min (Dosing Weight) Intravenous Continuous   ??? oncology mouthwash soln 30 mL  30 mL Swish & Spit TID   ??? pantoprazole inj 40 mg  40 mg IV Push Q24H   ??? polyethylene glycol pwd pkt 34 g  34 g Oral BID   ??? PrismaSate BGK 4/2.5 dialysate solution  5,000 mL/hr Dialysis Continuous   ??? propofol 10 mg/mL drip  75 mcg/kg/min (Dosing Weight) Intravenous Continuous   ??? senna tab 2 tablet  2 tablet Oral BID   ??? sodium chloride 0.9% IV soln bolus 1,000 mL  1,000 mL Intravenous PRN   ??? sodium chloride 0.9% IV soln  5-10 mL/hr Intravenous Continuous   ??? sodium chloride 0.9% IV soln  5-10 mL/hr Intravenous Continuous   ??? sodium chloride 0.9% IV soln  5-10 mL/hr Intravenous Continuous   ??? thiamine tab 100 mg  100 mg Oral Daily ??? vancomycin 750 mg in dextrose 5% 150 mL IVPB RTU  750 mg Intravenous Q12H   ??? vancomycin per pharmacy   Does not apply Per Protocol   ??? vasopressin 20 Units in sodium chloride 0.9% 100 mL drip  2.4 Units/hr Intravenous Continuous       Social History     Social History   ??? Marital status: Married     Spouse name: N/A   ??? Number of children: N/A   ??? Years of education: N/A     Occupational History   ??? Not on file.     Social History Main Topics   ??? Smoking status: Never Smoker   ??? Smokeless tobacco: Never Used   ??? Alcohol use 0.0 oz/week   ??? Drug use: No   ??? Sexual activity: Not on file     Other Topics Concern   ??? Not on file     Social History Narrative   ??? No narrative on file       Family History   Problem Relation Age of Onset   ??? Cancer Mother    ??? Cancer Father    ??? Malignant hyperthermia Neg Hx        Physical Examination:  BP 108/66 (BP Location: Left arm, Patient Position: Lying)  ~ Pulse 134  ~ Temp 36.3 ???C (97.3 ???F) (Axillary)  ~ Resp (!) 31  ~ Ht 1.854 m (6' 0.99'')  ~ Wt (!) 115.6 kg (254 lb 13.6 oz)  ~ SpO2 90%  ~ BMI 33.63 kg/m???   I/O last 3 completed shifts:  In: 6831.5 [I.V.:3689; Other:152.5; NG/GT:2350; IV Piggyback:640]  Out: 9147 [WGNFA:213; Other:1115]    General intubated and sedated lying on the bed   Chest; CTAB  Ext; 1+ edema   Dialysis Access: right femoral HD line     Labs:    Lab Results   Component Value Date    WBC 9.06 03/11/2017    HGB 8.9 (L) 03/11/2017    HCT 27.0 (L) 03/11/2017    MCV 91.2 03/11/2017    PLT 136 (L) 03/11/2017       Lab Results   Component Value Date    NA 135 03/11/2017    K 4.4 03/11/2017  CL 99 03/11/2017    CO2 18 (L) 03/11/2017    BUN 49 (H) 03/11/2017    CREAT 1.54 (H) 03/11/2017    GLUCOSE 239 (H) 03/11/2017       Lab Results   Component Value Date    CALCIUM 8.8 03/11/2017    ICALCOR 1.12 03/11/2017    PHOS 3.2 03/11/2017    MG 2.1 (H) 03/11/2017    ALBUMIN 4.6 03/11/2017       Lab Results   Component Value Date    PHART 7.28 (L) 03/11/2017 PCO2ART 46 (H) 03/11/2017    PO2ART 60 (L) 03/11/2017    BICARBART 20.7 (L) 03/11/2017    BEART -5 (L) 03/11/2017    O2SATART 87.7 (L) 03/11/2017    FIO2ART 90% 03/11/2017       Lab Results   Component Value Date    PHVEN 7.31 02/26/2017    PCO2VEN 60 02/26/2017    PO2VEN 48 02/26/2017    BICARBVEN 29.5 02/26/2017    BEVEN 2 02/26/2017       Imaging:  02/27/17 renal u/s  FINDINGS:  ???  Right kidney:  Length = 11.9 cm  Cortical thickness: Normal  Echogenicity: Normal  Collecting system: No hydronephrosis  Other findings: None  ???  Left kidney:  Length = 9.7 cm  Cortical thickness: Normal  Echogenicity: Normal  Collecting system: No hydronephrosis  Other findings: None  ???  Urinary bladder: Decompressed with a Foley catheter  ???  IMPRESSION:  ???  Normal renal ultrasound. No hydronephrosis.    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 Patient Started on palliative chemotherapy with carbo/etoposide 03/05/17. However, pt developed AKI. Baseline Creat is around 1.0 but it was peaked to 3.3 on the day of consult in 1/12/209.     # AKI could be due to multifactorial carboplatin induced or Tumor lysis syndrome Harmon Pier Bishop Score: positive Phos, urina acid increase and AKI  ) or sepsis. Given patient requires vasopressor and becoming anuric, will start CRRT.    # Hypoxemic respiratory failure now intubated, on ARDS net protocol with permissive hypercapnia.   # Metastatic small cell carcinoma   # COPD   # HFpEF  # Afib  # HTN  # DM2  # PAD s/p femoral graft  # Prior prostate CA    Recommendations:  1. Increase Nxstage 25cc/kg/hr to 5 liters/hr in setting of ARDS net protocol to correct pH to 7.4. 2. Check BMP, mag, and phos daily  3. Low Na, Low K, Low phos, 1L fluid restricted diet  4. Strict I&O and daily weights    -renally dose meds for GFR 20-30 while on CRRT  -avoid gadolinium exposure with MRI.      Discussed with attending physician.  Please call [phone] with any questions.  Will continue to follow.    Vincente Liberty, MD 03/11/2017 3:10 PM  Nephrology Fellow      Nephrology attending     I saw and evaluated  Ryan Shepard.  I discussed the case with the fellow and agree with the findings and plan of care as documented in the fellow's note. Patient has high probability of imminent or life-threatening deterioration due to acidosis,  fluid overload, electrolytes abnormality and hemodynamic instability   Total critical care time spent during last 24 hours including time spent reviewing lab and imaging results, adjusting CRRT, and examining the patient was more than 30 minutes          [employer] M.D.

## 2017-03-11 NOTE — Consults
INPATIENT RENAL CONSULTATION    Date of Service: 03/10/2017  Patient Name: Ryan Shepard  MRN: [id number]    Referring Physician: Suzzette Righter., MD    Consult Attending Physician:  Bobbye Charleston    Consult Fellow: Vincente Liberty, MD    Reason for Consultation:  Management of AKI and hemodialysis.    History of Present Illness:  77 y.o. 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 Patient Started on palliative chemotherapy with carbo/etoposide 03/05/17. However, pt developed AKI. Baseline Creat is around 1.0 but it was peaked to 3.3 on the day of consult in 1/12/209.     Review of Systems:  Besides what was mentioned above a 14-pt review of systems was obtained and found to be negative or normal.    Past Medical History:   Diagnosis Date   ??? Cancer (HCC/RAF)    ??? Diabetes (HCC/RAF)    ??? GERD (gastroesophageal reflux disease)    ??? Hypercholesteremia    ??? Hypertension    ??? PAD (peripheral artery disease) (HCC/RAF)    ??? 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 Prostate disease    ??? Scoliosis     adolesent   ??? Vascular disease        Past Surgical History:   Procedure Laterality Date   ??? BACK SURGERY     ??? BILATERAL FEMORAL ARTERY EXPLORATION  2009   ??? BLADDER SURGERY  1999   ??? PROSTATE SURGERY  2000       No Known Allergies    Current Facility-Administered Medications   Medication Dose Route Frequency   ??? acetaminophen tab 650 mg  650 mg Oral Q4H PRN   ??? albumin 25% inj 25 g  25 g Intravenous Q6H   ??? albuterol (2.5 mg/0.5 mL) 0.5% nebu soln 2.5 mg  2.5 mg Nebulization Q6H PRN   ??? allopurinol tab 300 mg  300 mg Oral Daily   ??? bisacodyl supp 10 mg  10 mg Rectal Daily PRN ??? calcium chloride 2 g in sodium chloride 0.9% 270 mL CRRT drip   Intravenous Continuous   ??? chlorhexidine 0.12% soln 10 mL  10 mL Mouth/Throat BID   ??? dexmedetomidine 1,000 mcg in sodium chloride 0.9% 250 mL drip  0.2 mcg/kg/hr (Dosing Weight) Intravenous Continuous   ??? insulin aspart (NovoLOG) 100 units/mL inj pen   Subcutaneous Q6H PRN    And   ??? dextrose 50% inj 25 g  25 g IV Push PRN   ??? fentaNYL (PF) 2,500 mcg in sodium chloride 0.9% 100 mL drip  50 mcg/hr Intravenous Continuous   ??? filgrastim-sndz 480 mcg/0.8 mL inj 480 mcg  480 mcg Subcutaneous Daily   ??? fluconazole tab 400 mg  400 mg Per OG Tube Daily   ??? heparin 1000 unit/mL inj 1,000 Units  1,000 Units Intracatheter PRN   ??? heparin 25,000 units in 0.45% NaCl 250 mL drip RTU  1,250 Units/hr Intravenous Continuous   ??? ipratropium 0.02% nebu soln 500 mcg  0.5 mg Nebulization Q6H PRN   ??? meropenem 1 g in sodium chloride 0.9% 120 mL IVPB  1 g Intravenous Q8H   ??? metoclopramide 5 mg/mL  inj 5 mg  5 mg IV Push TID   ??? norepinephrine 32 mg in sodium chloride 0.9% 250 mL drip  0.1-1 mcg/kg/min (Dosing Weight) Intravenous Continuous   ??? oncology mouthwash soln 30 mL  30 mL Swish & Spit TID   ??? pantoprazole inj 40 mg  40 mg IV Push Q24H   ??? polyethylene glycol pwd pkt 34 g  34 g Oral BID   ??? PrismaSate BGK 4/2.5 dialysate solution  3,000 mL/hr Dialysis Continuous   ??? propofol 10 mg/mL drip  75 mcg/kg/min (Dosing Weight) Intravenous Continuous   ??? senna tab 2 tablet  2 tablet Oral BID   ??? sodium chloride 0.9% IV soln bolus 1,000 mL  1,000 mL Intravenous PRN   ??? sodium chloride 0.9% IV soln  5-10 mL/hr Intravenous Continuous   ??? sodium chloride 0.9% IV soln  5-10 mL/hr Intravenous Continuous   ??? thiamine tab 100 mg  100 mg Oral Daily   ??? vancomycin per pharmacy   Does not apply Per Protocol       Social History     Social History   ??? Marital status: Married     Spouse name: N/A   ??? Number of children: N/A   ??? Years of education: N/A     Occupational History ??? Not on file.     Social History Main Topics   ??? Smoking status: Never Smoker   ??? Smokeless tobacco: Never Used   ??? Alcohol use 0.0 oz/week   ??? Drug use: No   ??? Sexual activity: Not on file     Other Topics Concern   ??? Not on file     Social History Narrative   ??? No narrative on file       Family History   Problem Relation Age of Onset   ??? Cancer Mother    ??? Cancer Father    ??? Malignant hyperthermia Neg Hx        Physical Examination:  BP 99/48  ~ Pulse 121  ~ Temp 37.4 ???C (99.3 ???F)  ~ Resp (!) 35  ~ Ht 1.854 m (6' 0.99'')  ~ Wt (!) 111.3 kg (245 lb 6 oz)  ~ SpO2 93%  ~ BMI 32.38 kg/m???   I/O last 3 completed shifts:  In: 6378.8 [I.V.:3541.3; Other:162.5; ZH/YQ:6578; IV Piggyback:1140]  Out: 1945 [Urine:1945]    General intubated and sedated lying on the bed   Chest; CTAB  Ext; 1+ edema   Dialysis Access: right femoral HD line     Labs:    Lab Results   Component Value Date    WBC 11.37 (H) 03/10/2017    HGB 9.2 (L) 03/10/2017    HCT 28.4 (L) 03/10/2017    MCV 91.9 03/10/2017    PLT 180 03/10/2017       Lab Results   Component Value Date    NA 140 03/10/2017    K 4.1 03/10/2017    CL 99 03/10/2017    CO2 16 (L) 03/10/2017    BUN 95 (H) 03/10/2017    CREAT 3.30 (H) 03/10/2017    GLUCOSE 179 (H) 03/10/2017       Lab Results   Component Value Date    CALCIUM 9.4 03/10/2017    ICALCOR 1.04 (L) 03/10/2017    PHOS 7.3 (H) 03/10/2017    MG 2.1 (H) 03/10/2017    ALBUMIN 4.4 03/10/2017       Lab Results   Component Value Date  PHART 7.20 (LL) 03/10/2017    PCO2ART 47 (H) 03/10/2017    PO2ART 109 03/10/2017    BICARBART 17.7 (L) 03/10/2017    BEART -9 (L) 03/10/2017    O2SATART 97.4 03/10/2017    FIO2ART 80% 03/10/2017       Lab Results   Component Value Date    PHVEN 7.31 02/26/2017    PCO2VEN 60 02/26/2017    PO2VEN 48 02/26/2017    BICARBVEN 29.5 02/26/2017    BEVEN 2 02/26/2017       Imaging:  02/27/17 renal u/s  FINDINGS:  ???  Right kidney:  Length = 11.9 cm  Cortical thickness: Normal  Echogenicity: Normal Collecting system: No hydronephrosis  Other findings: None  ???  Left kidney:  Length = 9.7 cm  Cortical thickness: Normal  Echogenicity: Normal  Collecting system: No hydronephrosis  Other findings: None  ???  Urinary bladder: Decompressed with a Foley catheter  ???  IMPRESSION:  ???  Normal renal ultrasound. No hydronephrosis.    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 Patient Started on palliative chemotherapy with carbo/etoposide 03/05/17. However, pt developed AKI. Baseline Creat is around 1.0 but it was peaked to 3.3 on the day of consult in 1/12/209.     # AKI could be due to multifactorial carboplatin induced or Tumor lysis syndrome Harmon Pier Bishop Score: positive Phos, urina acid increase and AKI  ) or sepsis. Given patient requires vasopressor and becoming anuric, will start CRRT.    # hypoxemic respiratory failure now intubated   # metastatic small cell carcinoma   # COPD   # HFpEF  # afib  #  HTN  # DM2  # PAD s/p femoral graft  #  prior prostate CA    Recommendations:  1. Continue Nxstage 25cc/kg/jr for now, adjust the date as needed.    2. Check BMP, mag, and phos daily  3. Low Na, Low K, Low phos, 1L fluid restricted diet  4. Strict I&O and daily weights    -renally dose meds for GFR 20-30 while on CRRT  -avoid gadolinium exposure with MRI.      Discussed with attending physician.  Please call [phone] with any questions.  Will continue to follow.    Vincente Liberty, MD 03/10/2017 6:19 PM  Nephrology Fellow      Nephrology attending     I saw and evaluated  [name].  I discussed the case with the fellow and agree with the findings and plan of care as documented in the fellow's note. [facility] M.D.

## 2017-03-11 NOTE — Progress Notes
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    -- Lines/Tubes --   ETT (size 8.63mm), PICC (RUE), A-line (right radial), NGT  -- Drips --  Fent, Precedex, propofol, levophed  -- Antimicrobials --  Levofloxacin, cefepime, Vanc  ???    IE/SUBJECTIVE:   IE:  - Overnight was repositioned, hypoxic, vent settings increased  - Metolazone 5mg  x1 spot  .  Subjective:   - Intubated and sedated. Unable to assess  OBJECTIVE:   VITALS:  Temp:  [37.4 ???C (99.3 ???F)-38.2 ???C (100.8 ???F)] 37.4 ???C (99.3 ???F)  Heart Rate:  [99-135] 121  Resp:  [27-35] 35  Arterial Line BP (mmHg): (63-133)/(42-67) 121/57  MAP:  [51 mmHg-89 mmHg] 79 mmHg  FiO2 (%):  [80 %-100 %] 80 %  SpO2:  [84 %-97 %] 93 %  01/11 0701 - 01/12 0700  In: 4212.5 [I.V.:2470]  Out: 1140 [Urine:1140]     Wt Readings from Last 1 Encounters:   03/10/17 (!) 111.3 kg (245 lb 6 oz)     Recent Labs      03/10/17   1734  03/10/17   1242  03/10/17   0839  03/10/17   0338   GLUCOSEPOC  210*  191*  210*  186*   }  Oxygen Therapy  SpO2: 93 %  O2 Device: ETT, Mechanical Ventilator  FiO2 (%): 80 %  O2 Flow Rate (L/min): 40 L/min     Vent Mode: VC  FiO2 (%):  [80 %-100 %] 80 %  Rate:  [30-35] 35  VT (mL):  [400 mL-480 mL] 480 mL  PEEP (setting):  [14 cmH2O-18 cmH2O] 14 cmH2O    PHYSICAL EXAM:  General - Intubated/Sedated  Neuro - Sedated, +gag, pupils reactive  HEENT- oral ulcers on tongue w/minimal bleeding  Cardiovascular - RRR no m/r/g, unable to assess JVP d/t girth, 2+ radial pulses b/l  Lungs - No audible cuff leak, no wheezing anteriorly, mechanical breath sounds Skin - No visible rashes or lesions  Abdomen - Central obesity, soft, NT/ND  Extremeties - Warm and well-perfused. 1+ LE edema    LABORATORY DATA:   BMP  Recent Labs      03/10/17   0334  03/09/17   1615  03/09/17   0401   NA  140  141  144   K  4.1  3.9  3.9   CL  99  99  100   CO2  16*  19*  19*   BUN  95*  95*  93*   CREAT  3.30*  2.80*  2.55*   CALCIUM  9.4  9.4  9.3   MG  2.1*  2.3*  2.2*   PHOS  7.3*  6.9*  6.8*     CBC  Recent Labs      03/10/17   0334  03/09/17   0401  03/08/17   0400   WBC  11.37*  6.31  6.75   HGB  9.2*  9.8*  10.2*   HCT  28.4*  31.7*  30.9*   MCV  91.9  94.6  92.0   PLT  180  184  187     Coags  Recent Labs      03/10/17   0334  03/10/17   0027  03/09/17   1805   APTT  106.3*  98.6*  84.9*     Blood gas  Recent Labs      03/10/17   1736   PHART  7.20*   PCO2ART  47*   PO2ART  109   BICARBART  17.7*   BEART  -9*       Micro:    Microbiology:   12/31 BAL cx - neg or NGTD  12/28 Resp Cx Probable C.albicans  12/28 Blood Cx NTD  12/28 RVP neg  12/18 Resp Cx Neg (C albicans)  12/13 BAL Cx +mycobacteria  12/13 respiratory culture: candida, therwise negative so far; AFB smear negative x 2 (AFB cultures NTD)  12/13 legionella culture neg  Aspergillus Ag EIA - ???<0.50  RVP panel negative  Aspergillus fumigatus (M3) IgE - NEG   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  HIV - neg  Legionella Urinary Ag - neg  Chlamydia/gonorrhea PCR - neg  RPR nonreactive    IMAGING:   CXR (1/10):    ASSESSMENT & PLAN:   Ryan Shepard???is a 77 y.o.???male???with a history of COPD (on 3LNC), HFpEF, afib, HTN, DM2, PAD s/p femoral graft, prior prostate [place] who was admitted for hypoxemic respiratory failure requiring intubation.    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 CT head negative for brain mets.  - Heme-onc consulted, appreciate recs.   - Family meeting with wife (DPOA), daughter on phone, primary ICU team and Heme-Onc on 1/7 PM. Discussed benefits/adverse effects of chemotherapy and possibility that this may help decr tumor burden in lung as possible bridge to extubation. Family agreed to initiation.  - Cont chemo (caroplatin/etoposide) initiated 1/7 PM  - Cont Allopurinol 300mg  qday (renal dosing)   - F/u G6PD testing in case rasburicase is needed  - TLS labs q12 (BMP, Phos, uric acid, LDH)  - Cont hep gtt  - per conversation w/heme-onc, don't expect full effect of chemo until 1 week, prognosis dependent on response to chemo, will talk about GOC at that point    RESP:  #Acute on chronic hypoxemic respiratory failure  Admitted from clinic requiring BiPAP in setting of decreased arterial PaO2 and desats to low 80s. Recently admitted w/ bronch showing chronic colonization with aspergillus. Has features c/f malignancy on imaging. Respiratory status continues to worsen with worsened xray. S/p upsized ETT on 1/1 w/ resolution of cuff leak. Initial c/f PE, CTA read no PE so d/c'd heparin gtt, LE dopplers negative for DVT. S/p Bronch on 1/10- demonstrated thin clear secretions.  - ARDS NET protocol  - Serial ABGs   - Malignancy tx, as below  - Antibiotics, as below    #Chronic COPD  Increased sputum production x 2 months, slight wheezing and recently treated with steroids while hospitalized. Current  presentation less c/w COPD exacerbation.  - Duonebs q6h  - Inhaled Flovent BID  - Empiric abx, as below  ???  CV:  #Hypovolemic shock in setting of sedation, improving  Patient requires significant sedation d/t air leak on cuff of ETT, patient changing position can increase air leak. Hypotension requiring pressors in setting of sedation but elevated lactate and signs of end-organ damage including rising Cr. Patient has leukocytosis and on empiric abx, no other signs of infection.  - A-line for accurate BP monitoring  - Pressors: cont levophed gtt  - S/p hydrocortisone and fludrocortisone (1/2-1/7)  - Trend lactate  - Goal MAPs > 65 on A-line    #Pericardial effusion: Small, stable    #HFpEF  Home dose lasix PO 40mg  BID. TTE shows hyperdynamic EF >75% and elevated PASP to .  - D/c albumin  - Start CRRT as below, goal -3L  - BID lytes, daily weights, strict I&O's     #Afib  Rate-controlled. CHADS2VaSC 3-4  - Hold home carvedilol 25 mg BID  - Hold home apixaban 5 mg BID  - hep gtt as above    RENAL:  #Acute renal failure:  - Start CRRT, goal -3L    NEURO:  #Intubated and sedated 2/2 hypoxemic respiratory failure  - Cont sedation with fentanyl/precedex/propofol  - Attempt sedation holiday as tolerated  - Repeat triglycerides q72h while on propofol    ID:  #Sepsis  Afebrile. Increased sputum production x 2 months otherwise no localizing signs of infection. Did receive course of methylprednisolone 12/18-12/23 on recent hospitalization. 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 +Mycobacteria 12/13 BAL resulted on 03/01/17, not nocardia as previously thought. On night of 1/10, febrile and tachycardic, and elevated procal 2.01.   - ID signed off, appreciate recs  - S/p IV Vanc  - S/p Azithromycin  - F/u urine histoplasma Ag (sendout lab), hold antifungal tx at this time  - S/p IV Zosyn  - S/p IV Bactrim 500mg  q8hr given that patient does not have pulmonary nocardia  - cont vancomycin (1/9-) for MRSA coverage  - cont mero (1/11-) for PsA coverage  - start flucon 400mg  qday (1/11-) for empiric fungemic coverage  - S/p levofloxacin (1/2- 1/11)  - S/p IV cefepime q12h for GP coverage (1/6-1/10)  - S/p azithro (1/10) - F/u BCx and fungal Cx from 1/10    CHRONIC  #HTN  - Hold home BP meds in setting of shock: nifedipine 30mg  daily, losartan 100 mg daily, carvedilol 25 mg BID  ???  #Chronic???pain   Localized to lower back and lower extremities  - Hold home Norco 5-325 q6h PRN while sedated  ???  #GERD chronic/stable  - Pantoprazole PO 40 mg daily 12/31  ???  #Depression/Anxiety  Mood stable, no SI, no anxiety  - Hold home buspirone 15 mg BID, duloxetine DR 60 while sedated  ???  #PAD  s/p femoral-femoral graft 2009.  - [place] home clopidogrel 75 mg daily  ???  #HLD chronic/stable  - Hold home Pravastatin 40 mg qhs  ???  #DM2 with associated diabetic peripheral neuropathy  - ISS#3 now that TPN started  - [place] home gabapentin 800 mg TID   ???  F - cont TFs. q6h refeeding labs, supp K/Mg/Phos PRN  A - Sedated  S - Fent/precedex/propofol  T -  Hep gtt  H - 30 degrees  U - PPI BID  G - ISS#3, Accuchecks q4h  B - Senna qhs, suppository, miralax  I -  ETT 8.22mm (upsided 1/1), PIV 22g R hand (12/28), CVC 3-Lumen L fem (1/1), A-line (12/31), OGT (12/31), PIV x 1 L hand    Disposition: ICU level care required for hypoxemic respiratory failure requiring intubation  ???  Code Status: Full Code  Contact:  Primary Emergency Contact: Brien,Johnnie, Home Phone: [phone]    DPOA is daughter Aboubacar Matsuo daughter [phone]    DWA Dr. Greta Doom, MD    I have seen and examined and personally managed this patient who remains critically ill and high risk for clinical deterioration related to acute hypoxic resp failure in setting of underlying COPD, multifocal PNA, septic shock, acute renal failure with volume overload, and new dx of small cell lung CA with concurrent malignant effusion and pericardial effusion.  ???  Remains very hypoxic on near max vent settings - labile with any movement. Febrile and on pressors and abxs escalated yesterday pending repeat cultures including BAL. Worsening oliguric renal function and consulted renal who are proceeding with CRRT for aggressive volume removal in hopes of improving resp status. Remains on anticoagulation for afib and continuing protective ventilation with ARDS net protocol. Prognosis guarded and discussed with family yesterday - again updated them today re: the need for HD for his acute renal failure. They did talk to oncology yesterday who felt that it was too early to tell if the chemo was effective for the component of SCLC contributing to his acute resp failure.   ???  Critical Care Time Personally Spent in Patient Care: 80 minutes exclusive of procedures.   ???  Ramzy Cappelletti S. Regino Schultze

## 2017-03-11 NOTE — Nursing Note
0430: CVVHD filter alarms, filter clotted and was not able to return blood. New filter priming.    0500: CVVHD tx continued with existing orders, all lines secure, no trending alarms, Y-line connected to venous port. MD's orders reviewed with primary RN. Will continue to monitor.

## 2017-03-11 NOTE — Nursing Note
0730 Report from [place] RN  0800 RT ARvin notified of Pulse ox readings. Pt's oxygen saturation on the monitor is <87%. Dr Krista Blue is aware the monitor does not reflect the ABGs.   0900 Pt's daughter has arrived--updated status and plan of care.     1210 MD Rounds. Continue ARDS Net. Insulin gtt for high glucose.   It is okay to wean down propofol gtt and turn off precedex.     1715 CRRT Nxstage access filter clotting. HD RN notified. CRRT was stopped to change filter and circuit.   TPA was ordered to TPA both HD ports.   New circuit has been primed.     1850 Bladder scanned 23ml.

## 2017-03-11 NOTE — Progress Notes
Pharmaceutical Services ??? Vancomycin Dosing (Ongoing)    Patient Name: Ryan Shepard  MRN: [id number]  Age: 77 y.o.  Sex: male    Vancomycin per Pharmacy Consult Order     Start     Ordered    03/07/17 1927  vancomycin per pharmacy  Per Protocol     Question Answer Comment   Indication Pneumonia    Goal Trough Serum Level 15-20 mcg/mL    Has patient received a dose of this drug within the past 72 hours? No        03/07/17 1927        Vital Signs/Other Objective Data  Allergies: Patient has no known allergies.    BP 108/66 (BP Location: Left arm, Patient Position: Lying)  ~ Pulse 132  ~ Temp 36.4 ???C (97.5 ???F) (Bladder)  ~ Resp (!) 35  ~ Ht 1.854 m (6' 0.99'')  ~ Wt (!) 115.6 kg (254 lb 13.6 oz)  ~ SpO2 (!) 87%  ~ BMI 33.63 kg/m???     Intake/Output Summary (Last 24 hours) at 03/11/17 1039  Last data filed at 03/11/17 1000   Gross per 24 hour   Intake          4424.51 ml   Output             1884 ml   Net          2540.51 ml       Medications  Med Administrations and Associated Flowsheet Values (last 72 hours)  Vancomycin administration    Date/Time Action Medication Dose Rate    03/10/17 2140 New Bag/ Syringe/ Cartridge    vancomycin 1 g in dextrose 5% 200 mL IVPB RTU 1 g 200 mL/hr    03/09/17 2023 New Bag/ Syringe/ Cartridge    vancomycin 750 mg in dextrose 5% 150 mL IVPB RTU 750 mg 150 mL/hr    03/08/17 1955 New Bag/ Syringe/ Cartridge    vancomycin 750 mg in dextrose 5% 150 mL IVPB RTU 750 mg 150 mL/hr        Labs (most recent)  White Blood Cell Count   Date Value Ref Range Status   03/11/2017 9.06 4.16 - 9.95 x10E3/uL Final   01/25/2008 7.82 3.28 - 9.29 x10E3/uL      Urea Nitrogen   Date Value Ref Range Status   03/11/2017 60 (H) 7 - 22 mg/dL Final   45/40/9811 33 (H) 7 - 23 mg/dL Final     Creatinine   Date Value Ref Range Status   03/11/2017 2.09 (H) 0.60 - 1.30 mg/dL Final   91/47/8295 0.9 0.5 - 1.3 mg/dL      Vancomycin,random (mcg/mL)   Date/Time Value   03/11/2017 0841 14.0       Assessment Indication Pneumonia  Goal trough level 15-20 mcg/mL  This patient is receiving dialysis: Yes, CRRT at 5L/hr    Plan  VIC ESCO is a 77 y.o. male who has been referred to pharmacy for vancomycin dosing. The indication for vancomycin is Pneumonia. Based on the measured vancomycin level, the dose should be changed.      Change vancomycin to 750 mg IV q12h     Pharmacy will continue to monitor the patient's clinical progress. The next vancomycin trough is scheduled for 03/13/17 at 1045.    Corinna Burkman [facility] [name], PharmD, 03/11/2017, 10:39 AM

## 2017-03-11 NOTE — Progress Notes
Hematology/Oncology Consult Progress Note   Patient name:  Ryan Shepard  MRN:  [id number]  DOB:  [birth year]  Location: 4435/A  Date of service:  03/11/2017    Reason for consult: Newly-diagnosed metastatic small cell carcinoma  Primary service: MICU  PCP: Sheppard Coil, MD    ID: 77 year-old former smoker (> 40 pk/yrs, quit ~20 years ago), COPD with chronic hypoxemic respiratory failure on 4L home oxygen, atrial fibrillation on chronic anticoagulation, HFpEF, HTN, T2DM, PAD and remote history of prostate cancer (s/p prostatectomy in 2000) who was admitted to the MICU on 12/28 with acute on chronic hypoxemic respiratory failure and as part of workup has been found to have metastatic small cell carcinoma, likely lung primary based on imaging, smoking history, and IHC. Started on palliative chemotherapy with carbo/etoposide 03/05/17.     Interval History/Subjective:  - Initiated on CRRT. Current vent setting FiO2 90%, PEEP 14.    PHYSICAL EXAM     Last Recorded Vital Signs:    03/11/17 0830   BP:    Pulse: 127   Resp: (!) 31   Temp:    SpO2: (!) 84%       Intake/Output Summary (Last 24 hours) at 03/11/17 0911  Last data filed at 03/11/17 0900   Gross per 24 hour   Intake          3968.59 ml   Output             1428 ml   Net          2540.59 ml      General: Intubated and sedated. On CRRT.   HEENT: Sclera anciteric. Conjunctiva pale. MMM.  CV: RRR. No frank m/r/g appreciated.  Pulm: Mechanically ventilated with symmetric chest rise. Coarse breath sounds.  Abd: +BS. Soft, mildly distended.  Ext: Trace lower extremity edema. No asymmetry noted.     OBJECTIVE DATA   LABS  Lab Results   Component Value Date    WBC 9.06 03/11/2017    NEUTABS 6.44 03/11/2017    HGB 8.9 (L) 03/11/2017    HCT 27.0 (L) 03/11/2017    PLT 136 (L) 03/11/2017     Lab Results   Component Value Date    PT 16.2 (H) 02/24/2017    INR 1.4 02/05/2017    APTT 83.2 (H) 03/11/2017     Lab Results   Component Value Date    NA 139 03/11/2017 K 4.2 03/11/2017    CL 100 03/11/2017    CO2 19 (L) 03/11/2017    BUN 60 (H) 03/11/2017    CREAT 2.09 (H) 03/11/2017    GLUCOSE 254 (H) 03/11/2017    CALCIUM 8.8 03/11/2017    MG 2.1 (H) 03/11/2017    PHOS 4.1 03/11/2017    LDH 369 (H) 03/11/2017    URICACID 2.7 (L) 03/11/2017     Lab Results   Component Value Date    APTT 83.2 (H) 03/11/2017    PT 16.2 (H) 02/07/2017    INR 1.4 02/05/2017     Lab Results   Component Value Date    ALT 14 03/11/2017    AST 36 03/11/2017    BILITOT 0.6 03/11/2017    ALKPHOS 78 03/11/2017    ALBUMIN 4.6 03/11/2017    TOTPRO 6.6 03/11/2017     Pertinent Microbiology:   1/5 L pleural fluid cx NGF  1/3 Resp cx Probable C.albicans  1/2 BCx x2 NGF  1/2 UA neg  12/31 BAL cx -  neg  12/28 Resp Cx Probable C.albicans  12/28 Blood Cx NTD  12/28 RVP neg  12/18 Resp cx Neg (C albicans)  12/13 BAL Nocardia cx: Rare beaded Gram positive rods --> fast-growing mycobacterium  12/13 AFB cx Mycobacterium species  12/13 BAL cx Neg  12/13 Respiratory culture: Candida, otherwise negative so far; AFB smear negative x 2  12/13 Legionella culture neg  Aspergillus Ag EIA - ???<0.50  RVP panel negative  Aspergillus fumigatus (M3) IgE - NEG   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  Pertinent Imaging:   02/16/17 CT chest with contrast   - Showed prominent mediastinal and hilar lymphadenopathy, with encasement of left CCA and partial encasement of right innominate and left subclavian.   - Airspace and nodular consolidation prominent in LUL and lingula. Moderate left pleural effusion, with multiple pleural nodules. Small pericardial effusion.  ???  02/16/17 CT A/P with contrast   - Showed at least 4 hypervascular liver lesions within right hepatic lobe, largest measuring 5.0 x 3.6 cm. - Moderate atherosclerotic calcifications throughout abdominal vessels and renovascular calcifications. Patent fem-fem bypass. Multifocal arterial stenosis in femoral arteries.     02/27/17 CT chest, angiogram   - Did not show evidence of PE.   - Redemonstration of airspace and nodular consolidation, most prominent in LUL and lingula, bilateral mediastinal and lymphadenopathy.   - Interval increase in moderate left pleural effusion. Multiple pleural nodules. Interval increase in small pericardial effusion.   ???  Pertinent Pathology:   02/26/17 BAL    - Cytology was negative for malignant cells. Flow cytometry did not show evidence of lymphoproliferative disease.   ???  03/01/17 Biopsy of left liver lesion   - Metastatic poorly differentiated neuroendocrine carcinoma. Synaptophysin/chromogranin positive. TTF-1 patchy positive. PSA negative. Ki67 > 90%. Flow cytometry was limited due to low quantity of recoverable cells, but no discrete monotypic B-cell population seen.       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 Palliative chemotherapy with carbo/etoposide was started on 03/05/17.  ???  #Extensive-stage small cell carcinoma, likely lung primary, with metastases to pleura and liver (CT head negative for brain mets)  #AKI, likely multifactorial (preceded initiation of chemotherapy, but likely to be exacerbated by chemotherapy, possible tumor lysis, ongoing hypoxemic respiratory failure, and sepsis) I discussed with the patient's wife and daughter the diagnosis and the rationale for initiation of therapy, as well as the potential associated risks. The goal of therapy is palliative. We will see how he does with induction and hope that his respiratory status will improve such that he can be extubated. Further treatment will be discussed pending possible response and clinical course. I discussed that there are significant risks associated with chemotherapy, but that given his dire clinical situation, the potential benefit outweighs the  risk. Risks include but are not limited to tumor lysis syndrome, leading to renal failure with potential need for initiation of dialysis, worsening of fluid overload that may exacerbate respiratory status, and neutropenic sepsis.   ???  Carboplatin IV AUC of 4 once D1 (dose reduced due to critical illness) --> done  Etoposide IV 80 mg/m2 D1-D3 (dose reduced due to critical illness and AKI) --> done  G-CSF following completion of chemotherapy   ???  The patient remains in critical condition, intubated/sedated with high ventilator requirements and was initiated on CRRT yesterday, with limited ability to pull fluid due to hypotension. There has not been clear signs of response to chemotherapy, as we would hope to see an improvement in his respiratory status. Other factors such as fluid overload and infection are contributing but are minor in comparison to cancer burden. We appreciate the primary team caring for the patient and trying to optimize his respiratory status as best as possible, while we wait to see if there is any benefit from chemotherapy, but we anticipate needing to address his goals of care in the next few days if he does not improve.     I talked with his daughter Jerline Pain today to give our impression that he is not clinically improving and that even though he ''wanted everything done'' to think about what that means if interventions are unlikely to be helpful. Patient discussed with attending Dr. Clifton James. Please feel free to page [phone] with questions/concerns.  ???  Nydia Bouton  Hematology/Oncology Fellow  [phone]  ???

## 2017-03-12 ENCOUNTER — Other Ambulatory Visit (HOSPITAL_COMMUNITY): Payer: Self-pay | Admitting: Family Medicine

## 2017-03-12 DIAGNOSIS — Z7189 Other specified counseling: Secondary | ICD-10-CM

## 2017-03-12 DIAGNOSIS — R911 Solitary pulmonary nodule: Secondary | ICD-10-CM

## 2017-03-12 LAB — Glucose,POC
GLUCOSE,POC: 242 mg/dL — ABNORMAL HIGH (ref 65–99)
GLUCOSE,POC: 247 mg/dL — ABNORMAL HIGH (ref 65–99)
GLUCOSE,POC: 237 mg/dL — ABNORMAL HIGH (ref 65–99)
GLUCOSE,POC: 252 mg/dL — ABNORMAL HIGH (ref 65–99)
GLUCOSE,POC: 213 mg/dL — ABNORMAL HIGH (ref 65–99)
GLUCOSE,POC: 218 mg/dL — ABNORMAL HIGH (ref 65–99)
GLUCOSE,POC: 231 mg/dL — ABNORMAL HIGH (ref 65–99)
GLUCOSE,POC: 234 mg/dL — ABNORMAL HIGH (ref 65–99)
GLUCOSE,POC: 184 mg/dL — ABNORMAL HIGH (ref 65–99)
GLUCOSE,POC: 192 mg/dL — ABNORMAL HIGH (ref 65–99)
GLUCOSE,POC: 177 mg/dL — ABNORMAL HIGH (ref 65–99)
GLUCOSE,POC: 168 mg/dL — ABNORMAL HIGH (ref 65–99)
GLUCOSE,POC: 153 mg/dL — ABNORMAL HIGH (ref 65–99)
GLUCOSE,POC: 168 mg/dL — ABNORMAL HIGH (ref 65–99)
GLUCOSE,POC: 169 mg/dL — ABNORMAL HIGH (ref 65–99)
GLUCOSE,POC: 191 mg/dL — ABNORMAL HIGH (ref 65–99)
GLUCOSE,POC: 198 mg/dL — ABNORMAL HIGH (ref 65–99)
GLUCOSE,POC: 208 mg/dL — ABNORMAL HIGH (ref 65–99)
GLUCOSE,POC: 188 mg/dL — ABNORMAL HIGH (ref 65–99)
GLUCOSE,POC: 185 mg/dL — ABNORMAL HIGH (ref 65–99)

## 2017-03-12 LAB — Blood Gases, arterial
ABG INSPIRED O2: 90 mmol/L (ref ?–2)
BICARBONATE: 21.2 mmol/L — ABNORMAL LOW (ref 22.0–26.0)
BICARBONATE: 22.3 mmol/L (ref 22.0–26.0)
O2 SATURATION/MEASURED: 86.9 — ABNORMAL LOW (ref >=95.0)
PCO2: 47 mmHg — ABNORMAL HIGH (ref 38–42)
PH: 7.28 mmol/L — ABNORMAL LOW (ref 7.37–7.41)
PO2: 70 mmHg — ABNORMAL LOW (ref 98–118)

## 2017-03-12 LAB — Legionella Culture: LEGIONELLA CULTURE: NEGATIVE

## 2017-03-12 LAB — Phosphorus
PHOSPHORUS: 2.8 mg/dL (ref 2.3–4.4)
PHOSPHORUS: 3.4 mg/dL (ref 2.3–4.4)
PHOSPHORUS: 2.6 mg/dL (ref 2.3–4.4)
PHOSPHORUS: 2.5 mg/dL (ref 2.3–4.4)

## 2017-03-12 LAB — APTT
APTT: 61.8 s — ABNORMAL HIGH (ref 24.4–36.2)
APTT: 66.7 s — ABNORMAL HIGH (ref 24.4–36.2)

## 2017-03-12 LAB — Magnesium
MAGNESIUM: 2.1 meq/L — ABNORMAL HIGH (ref 1.4–1.9)
MAGNESIUM: 2.1 meq/L — ABNORMAL HIGH (ref 1.4–1.9)
MAGNESIUM: 2.2 meq/L — ABNORMAL HIGH (ref 1.4–1.9)
MAGNESIUM: 2.2 meq/L — ABNORMAL HIGH (ref 1.4–1.9)

## 2017-03-12 LAB — C difficile PCR: C DIFFICILE PCR: NEGATIVE

## 2017-03-12 LAB — Calcium,Ionized
IONIZED CA++,CORRECTED: 1.1 mmol/L (ref 1.09–1.29)
IONIZED CA++,UNCORRECTED: 1.11 mmol/L (ref 1.09–1.29)
IONIZED CA++,UNCORRECTED: 1.18 mmol/L (ref 1.09–1.29)
IONIZED CA++,UNCORRECTED: 1.22 mmol/L (ref 1.09–1.29)

## 2017-03-12 LAB — Basic Metabolic Panel
CALCIUM: 9.1 mg/dL (ref 8.6–10.4)
CHLORIDE: 102 mmol/L (ref 96–106)
CREATININE: 1.12 mg/dL (ref 0.60–1.30)
CREATININE: 1.12 mg/dL (ref 0.60–1.30)
GFR ESTIMATE FOR NON-AFRICAN AMERICAN: 53 mmol/L (ref 135–146)
POTASSIUM: 4.7 mmol/L (ref 3.6–5.3)
TOTAL CO2: 19 mmol/L — ABNORMAL LOW (ref 20–30)
TOTAL CO2: 20 mmol/L (ref 20–30)

## 2017-03-12 LAB — Blood Lactate: BLOOD LACTATE: 17 mg/dL (ref 5–25)

## 2017-03-12 LAB — CBC: HEMOGLOBIN: 7.7 g/dL — ABNORMAL LOW (ref 13.5–17.1)

## 2017-03-12 LAB — Hepatic Funct Panel: TOTAL PROTEIN: 7 g/dL (ref 6.1–8.2)

## 2017-03-12 LAB — Lactate Dehydrogenase
LACTATE DEHYDROGENASE: 360 U/L — ABNORMAL HIGH (ref 125–256)
LACTATE DEHYDROGENASE: 334 U/L — ABNORMAL HIGH (ref 125–256)

## 2017-03-12 LAB — Bacterial Culture Urine: BACTERIAL CULTURE URINE: NO GROWTH {titer}

## 2017-03-12 LAB — Differential, Manual: PLATELET ESTIMATION: DECREASED — AB (ref 1.8–6.9)

## 2017-03-12 LAB — Uric Acid
URIC ACID: 1.6 mg/dL — ABNORMAL LOW (ref 3.4–8.8)
URIC ACID: 1.3 mg/dL — ABNORMAL LOW (ref 3.4–8.8)

## 2017-03-12 LAB — Bacterial Culture Respiratory

## 2017-03-12 MED ADMIN — ONCOLOGY MOUTHWASH: 30 mL | ORAL | @ 04:00:00 | Stop: 2017-03-13 | NDC 00067628212

## 2017-03-12 MED ADMIN — PANTOPRAZOLE SODIUM 40 MG IV SOLR: 40 mg | INTRAVENOUS | @ 08:00:00 | Stop: 2017-03-13 | NDC 55150020210

## 2017-03-12 MED ADMIN — ALTEPLASE 2 MG IJ SOLR FOR 2 MG DOSE LINE DECLOTTING: 2 mg | @ 02:00:00 | Stop: 2017-03-13 | NDC 50242004164

## 2017-03-12 MED ADMIN — PHENYLEPHRINE 50, 100 MG/250 ML DRIP: 40200 ug/min | INTRAVENOUS | @ 23:00:00 | Stop: 2017-03-13 | NDC 76014000410

## 2017-03-12 MED ADMIN — CHLORHEXIDINE GLUCONATE 0.12 % MT SOLN: 10 mL | OROMUCOSAL | @ 06:00:00 | Stop: 2017-03-13 | NDC 52376002110

## 2017-03-12 MED ADMIN — VANCOMYCIN 750 MG/150 ML RTU: 750 mg | INTRAVENOUS | @ 07:00:00 | Stop: 2017-03-13 | NDC 00338358048

## 2017-03-12 MED ADMIN — AMIKACIN >750 MG IVPB (EXTENDED): 950 mg | INTRAVENOUS | @ 01:00:00 | Stop: 2017-03-12 | NDC 00703904003

## 2017-03-12 MED ADMIN — SENNOSIDES 8.6 MG PO TABS: 2 | ORAL | @ 04:00:00 | Stop: 2017-03-13

## 2017-03-12 MED ADMIN — FENTANYL 2.5 MG/100 ML NS DRIP: 50 ug/h | INTRAVENOUS | @ 10:00:00 | Stop: 2017-03-13

## 2017-03-12 MED ADMIN — ALBUMIN HUMAN 25 % IV SOLN: 25 g | INTRAVENOUS | @ 08:00:00 | Stop: 2017-03-12 | NDC 68516521601

## 2017-03-12 MED ADMIN — MEROPENEM 100 ML NS IVPB: 1 g | INTRAVENOUS | @ 18:00:00 | Stop: 2017-03-13 | NDC 63323050830

## 2017-03-12 MED ADMIN — PHENYLEPHRINE 50, 100 MG/250 ML DRIP: 40200 ug/min | INTRAVENOUS | @ 14:00:00 | Stop: 2017-03-12 | NDC 76014000410

## 2017-03-12 MED ADMIN — HEPARIN 25,000 UNITS/250 ML 0.45% NACL RTU: 1250 [IU]/h | INTRAVENOUS | @ 16:00:00 | Stop: 2017-03-13 | NDC 00409765062

## 2017-03-12 MED ADMIN — PROPOFOL INFUSION 10 MG/ML: 7755 ug/min | INTRAVENOUS | @ 19:00:00 | Stop: 2017-03-14 | NDC 63323026965

## 2017-03-12 MED ADMIN — INSULIN REGULAR HUMAN 100 UNITS/100 ML DRIP FOR ICU DRIP: .540 [IU]/h | INTRAVENOUS | @ 01:00:00 | Stop: 2017-03-13 | NDC 00338004918

## 2017-03-12 MED ADMIN — ALBUMIN HUMAN 25 % IV SOLN: 25 g | INTRAVENOUS | @ 13:00:00 | Stop: 2017-03-12 | NDC 68516521601

## 2017-03-12 MED ADMIN — PROPOFOL INFUSION 10 MG/ML: 7755 ug/min | INTRAVENOUS | @ 23:00:00 | Stop: 2017-03-14 | NDC 63323026965

## 2017-03-12 MED ADMIN — PRISMASATE BGK 4/2.5 DIALYSATE SOLUTION: 12000 mL/h | INTRAVENOUS_CENTRAL | @ 03:00:00 | Stop: 2017-03-13 | NDC 04444000018

## 2017-03-12 MED ADMIN — PRISMASATE BGK 4/2.5 DIALYSATE SOLUTION: 12000 mL/h | INTRAVENOUS_CENTRAL | @ 08:00:00 | Stop: 2017-03-13 | NDC 04444000018

## 2017-03-12 MED ADMIN — PROPOFOL INFUSION 10 MG/ML: 7755 ug/min | INTRAVENOUS | @ 10:00:00 | Stop: 2017-03-14

## 2017-03-12 MED ADMIN — MEROPENEM 100 ML NS IVPB: 1 g | INTRAVENOUS | @ 02:00:00 | Stop: 2017-03-13 | NDC 63323050830

## 2017-03-12 MED ADMIN — FLUDROCORTISONE ACETATE 0.1 MG PO TABS: .05 mg | OROGASTRIC | @ 16:00:00 | Stop: 2017-03-13 | NDC 00115703301

## 2017-03-12 MED ADMIN — CALCIUM CHLORIDE 2 GM IN 250 ML CRRT: 270 mL | INTRAVENOUS | @ 05:00:00 | Stop: 2017-03-13

## 2017-03-12 MED ADMIN — METOCLOPRAMIDE HCL 5 MG/ML IJ SOLN: 5 mg | INTRAVENOUS | @ 13:00:00 | Stop: 2017-03-13 | NDC 00409341401

## 2017-03-12 MED ADMIN — INSULIN REGULAR HUMAN 100 UNITS/100 ML DRIP FOR ICU DRIP: .540 [IU]/h | INTRAVENOUS | @ 01:00:00 | Stop: 2017-03-13

## 2017-03-12 MED ADMIN — POLYETHYLENE GLYCOL 3350 17 G PO PACK: 34 g | ORAL | @ 04:00:00 | Stop: 2017-03-13

## 2017-03-12 MED ADMIN — PHENYLEPHRINE 50, 100 MG/250 ML DRIP: 40200 ug/min | INTRAVENOUS | @ 09:00:00 | Stop: 2017-03-12 | NDC 76014000410

## 2017-03-12 MED ADMIN — HYDROCORTISONE NA SUCCINATE PF 100 MG IJ SOLR: 50 mg | INTRAVENOUS | @ 06:00:00 | Stop: 2017-03-13 | NDC 00009001104

## 2017-03-12 MED ADMIN — HYDROCORTISONE NA SUCCINATE PF 100 MG IJ SOLR: 50 mg | INTRAVENOUS | @ 18:00:00 | Stop: 2017-03-13 | NDC 00009001104

## 2017-03-12 MED ADMIN — ALLOPURINOL 300 MG PO TABS: 300 mg | ORAL | @ 16:00:00 | Stop: 2017-03-13 | NDC 51079020620

## 2017-03-12 MED ADMIN — NOREPINEPHRINE 8, 16, 32 MG/250 ML DRIP: 10.34103.4 ug/min | INTRAVENOUS | @ 13:00:00 | Stop: 2017-03-13

## 2017-03-12 MED ADMIN — METOCLOPRAMIDE HCL 5 MG/ML IJ SOLN: 5 mg | INTRAVENOUS | @ 19:00:00 | Stop: 2017-03-13 | NDC 00409341401

## 2017-03-12 MED ADMIN — ALBUMIN HUMAN 25 % IV SOLN: 25 g | INTRAVENOUS | @ 22:00:00 | Stop: 2017-03-12

## 2017-03-12 MED ADMIN — ONCOLOGY MOUTHWASH: 30 mL | ORAL | @ 13:00:00 | Stop: 2017-03-13 | NDC 00067628212

## 2017-03-12 MED ADMIN — PRISMASATE BGK 4/2.5 DIALYSATE SOLUTION: 12000 mL/h | INTRAVENOUS_CENTRAL | @ 16:00:00 | Stop: 2017-03-13 | NDC 04444000018

## 2017-03-12 MED ADMIN — NOREPINEPHRINE 8, 16, 32 MG/250 ML DRIP: 10.34103.4 ug/min | INTRAVENOUS | @ 04:00:00 | Stop: 2017-03-13

## 2017-03-12 MED ADMIN — PROPOFOL INFUSION 10 MG/ML: 7755 ug/min | INTRAVENOUS | @ 16:00:00 | Stop: 2017-03-14 | NDC 63323026965

## 2017-03-12 MED ADMIN — METOCLOPRAMIDE HCL 5 MG/ML IJ SOLN: 5 mg | INTRAVENOUS | @ 04:00:00 | Stop: 2017-03-13 | NDC 00409341401

## 2017-03-12 MED ADMIN — PROPOFOL INFUSION 10 MG/ML: 7755 ug/min | INTRAVENOUS | @ 11:00:00 | Stop: 2017-03-14 | NDC 63323026965

## 2017-03-12 MED ADMIN — PRISMASATE BGK 4/2.5 DIALYSATE SOLUTION: 12000 mL/h | INTRAVENOUS_CENTRAL | @ 12:00:00 | Stop: 2017-03-13 | NDC 04444000018

## 2017-03-12 MED ADMIN — POLYETHYLENE GLYCOL 3350 17 G PO PACK: 34 g | ORAL | @ 16:00:00 | Stop: 2017-03-13

## 2017-03-12 MED ADMIN — PROPOFOL INFUSION 10 MG/ML: 7755 ug/min | INTRAVENOUS | @ 22:00:00 | Stop: 2017-03-14 | NDC 63323026965

## 2017-03-12 MED ADMIN — PROPOFOL INFUSION 10 MG/ML: 7755 ug/min | INTRAVENOUS | @ 02:00:00 | Stop: 2017-03-14 | NDC 63323026965

## 2017-03-12 MED ADMIN — CASPOFUNGIN 150 ML IVPB: 50 mg | INTRAVENOUS | @ 23:00:00 | Stop: 2017-03-13 | NDC 00338004902

## 2017-03-12 MED ADMIN — PHENYLEPHRINE 50, 100 MG/250 ML DRIP: 40200 ug/min | INTRAVENOUS | @ 20:00:00 | Stop: 2017-03-12 | NDC 76014000410

## 2017-03-12 MED ADMIN — PRISMASATE BGK 4/2.5 DIALYSATE SOLUTION: 12000 mL/h | INTRAVENOUS_CENTRAL | @ 18:00:00 | Stop: 2017-03-13 | NDC 04444000018

## 2017-03-12 MED ADMIN — INSULIN REGULAR HUMAN BOLUS FROM DRIP: 5 [IU] | INTRAVENOUS | Stop: 2017-03-12 | NDC 00338004911

## 2017-03-12 MED ADMIN — VASOPRESSIN 20 UNITS, 40 UNITS/100 ML DRIP: 2.4 [IU]/h | INTRAVENOUS | @ 09:00:00 | Stop: 2017-03-13 | NDC 42023016425

## 2017-03-12 MED ADMIN — THIAMINE HCL 100 MG PO TABS: 100 mg | ORAL | @ 16:00:00 | Stop: 2017-03-13

## 2017-03-12 MED ADMIN — VANCOMYCIN 750 MG/150 ML RTU: 750 mg | INTRAVENOUS | @ 19:00:00 | Stop: 2017-03-13 | NDC 00338358048

## 2017-03-12 MED ADMIN — FENTANYL 2.5 MG/100 ML NS DRIP: 50 ug/h | INTRAVENOUS | @ 17:00:00 | Stop: 2017-03-13 | NDC 00409909461

## 2017-03-12 MED ADMIN — VASOPRESSIN 20 UNITS, 40 UNITS/100 ML DRIP: 2.4 [IU]/h | INTRAVENOUS | @ 23:00:00 | Stop: 2017-03-13 | NDC 42023016425

## 2017-03-12 MED ADMIN — SENNOSIDES 8.6 MG PO TABS: 2 | ORAL | @ 16:00:00 | Stop: 2017-03-13

## 2017-03-12 MED ADMIN — MEROPENEM 100 ML NS IVPB: 1 g | INTRAVENOUS | @ 10:00:00 | Stop: 2017-03-13 | NDC 63323050830

## 2017-03-12 MED ADMIN — ONCOLOGY MOUTHWASH: 30 mL | ORAL | @ 19:00:00 | Stop: 2017-03-13 | NDC 00067628212

## 2017-03-12 MED ADMIN — NOREPINEPHRINE 8, 16, 32 MG/250 ML DRIP: 10.34103.4 ug/min | INTRAVENOUS | @ 23:00:00 | Stop: 2017-03-13

## 2017-03-12 MED ADMIN — ALBUMIN HUMAN 25 % IV SOLN: 25 g | INTRAVENOUS | @ 02:00:00 | Stop: 2017-03-12 | NDC 68516521601

## 2017-03-12 MED ADMIN — CALCIUM CHLORIDE 2 GM IN 250 ML CRRT: 270 mL | INTRAVENOUS | @ 10:00:00 | Stop: 2017-03-13 | NDC 76329330401

## 2017-03-12 MED ADMIN — INSULIN REGULAR HUMAN 100 UNITS/100 ML DRIP FOR ICU DRIP: .540 [IU]/h | INTRAVENOUS | Stop: 2017-03-13 | NDC 00338004918

## 2017-03-12 MED ADMIN — CHLORHEXIDINE GLUCONATE 0.12 % MT SOLN: 10 mL | OROMUCOSAL | @ 16:00:00 | Stop: 2017-03-13 | NDC 52376002110

## 2017-03-12 MED ADMIN — FILGRASTIM-SNDZ 480 MCG/0.8ML IJ SOSY: 480 ug | SUBCUTANEOUS | @ 16:00:00 | Stop: 2017-03-14 | NDC 61314032610

## 2017-03-12 MED ADMIN — PRISMASATE BGK 4/2.5 DIALYSATE SOLUTION: 12000 mL/h | INTRAVENOUS_CENTRAL | @ 22:00:00 | Stop: 2017-03-13 | NDC 04444000018

## 2017-03-12 MED ADMIN — INSULIN REGULAR HUMAN 100 UNITS/100 ML DRIP FOR ICU DRIP: .540 [IU]/h | INTRAVENOUS | @ 14:00:00 | Stop: 2017-03-13 | NDC 00338004918

## 2017-03-12 MED ADMIN — HYDROCORTISONE NA SUCCINATE PF 100 MG IJ SOLR: 50 mg | INTRAVENOUS | @ 12:00:00 | Stop: 2017-03-13 | NDC 00009001104

## 2017-03-12 NOTE — Nursing Note
2030: Assessed HD catheter post TPA, venous port still sluggish. CRRT restarted with current orders with BFR of 200 d/t high venous pressure. MD's order reviewed with Primary RN. Tegos cap changed. All lines secured. Will continue to monitor.

## 2017-03-12 NOTE — Other
Patients Clinical Goal:   Clinical Goal(s) for the Shift: Maintain hemodynamics. MAP goal >65. O2 SAt goal 88-92%. ARDS Net Protocol. CRRT I/O goal -3L. Heparin gtt per protocol. Maintain comfort and safety.   Identify possible barriers to advancing the care plan: Cancer  Stability of the patient: Moderately Unstable - medium risk of patient condition declining or worsening    End of Shift Summary:   BP has been labile. MAP within goal.   Unable to wean down Levophed at this time.   Oxygen saturation does not correlate to monitor readings. MD and RT is aware. Continue ABG draws.   Continue ARDS Net protocol.   No signs of bleeding. Continue heparin gtt per protocol.   Insulin gtt has been started.   Possible family meeting tomorrow.

## 2017-03-12 NOTE — Other
Patients Clinical Goal:   Clinical Goal(s) for the Shift: MAP>65, SpO2>88<92% ARDS NET ABG Q4/as needed, CRRT Q4 labs, CHG, monitor TF, maintain comfort keep pt sedated to RAS -2-3, replace electrolytes, update pt family.   Identify possible barriers to advancing the care plan:  Stability of the patient: Unstable - high likelihood or risk of patient condition declining or worsening; patient condition declining or worsening    End of Shift Summary: MAP>65 on neo off levo, SpO2>88<92% ARDS NET ABG was sent Q4+, CRRT labs sent, pt is tolerating TF, pt is comfortable sedated RAS-2-3, CRRT within goal, CHG is done as tolerated, pt family updated and teaching was provided.

## 2017-03-12 NOTE — Nursing Note
CRRT rounds completed. Orders verified with primary RN at bedside. Machine pressures within normal limits.

## 2017-03-12 NOTE — Progress Notes
INPATIENT RENAL CONSULTATION    Date of Service: 03/12/2017  Patient Name: Ryan Shepard  MRN: [id number]    Referring Physician: Suzzette Righter., MD    Consult Attending Physician:  Bobbye Charleston    Consult Fellow: Emiliano Dyer, MD    Reason for Consultation:  Management of AKI and hemodialysis.    History of Present Illness:  77 y.o. 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 Patient Started on palliative chemotherapy with carbo/etoposide 03/05/17. However, pt developed AKI. Baseline Creat is around 1.0 but it was peaked to 3.3 on the day of consult in 1/12/209.     Interval event:   1/13: clotted filter once. I/O 4202/428 ml   1/14: Tolerating CRRT. GOC discussion today with primary team.     Past Medical History:   Diagnosis Date   ??? Cancer (HCC/RAF)    ??? Diabetes (HCC/RAF)    ??? GERD (gastroesophageal reflux disease)    ??? Hypercholesteremia    ??? Hypertension    ??? PAD (peripheral artery disease) (HCC/RAF)    ??? 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 Prostate disease    ??? Scoliosis     adolesent   ??? Vascular disease        Past Surgical History:   Procedure Laterality Date   ??? BACK SURGERY     ??? BILATERAL FEMORAL ARTERY EXPLORATION  2009   ??? BLADDER SURGERY  1999   ??? PROSTATE SURGERY  2000       No Known Allergies    Current Facility-Administered Medications   Medication Dose Route Frequency   ??? acetaminophen tab 650 mg  650 mg Oral Q4H PRN   ??? albuterol (2.5 mg/0.5 mL) 0.5% nebu soln 2.5 mg  2.5 mg Nebulization Q6H PRN   ??? allopurinol tab 300 mg  300 mg Oral Daily   ??? alteplase inj 2 mg  2 mg Intracatheter PRN   ??? bisacodyl supp 10 mg  10 mg Rectal Daily PRN ??? calcium chloride 2 g in sodium chloride 0.9% 270 mL CRRT drip   Intravenous Continuous   ??? caspofungin 50 mg in sodium chloride 0.9% 250 mL IVPB  50 mg Intravenous Q24H   ??? chlorhexidine 0.12% soln 10 mL  10 mL Mouth/Throat BID   ??? insulin regular 100 Units in sodium chloride 0.9% 100 mL drip (ICU ONLY)  0.5-40 Units/hr Intravenous Continuous    And   ??? dextrose 10% IV soln  40 mL/hr Intravenous Continuous PRN    And   ??? dextrose 50% inj 25 g  25 g IV Push PRN   ??? fentaNYL (PF) 2,500 mcg in sodium chloride 0.9% 100 mL drip  50 mcg/hr Intravenous Continuous   ??? filgrastim-sndz 480 mcg/0.8 mL inj 480 mcg  480 mcg Subcutaneous Daily   ??? fludrocortisone tab 0.05 mg  0.05 mg Per OG Tube Daily   ??? heparin 1000 unit/mL inj 1,000 Units  1,000 Units Intracatheter PRN   ??? heparin 25,000 units in 0.45% NaCl 250 mL drip RTU  1,250 Units/hr Intravenous Continuous   ??? hydrocortisone inj 50 mg  50 mg IV Push Q6H   ??? ipratropium 0.02%  nebu soln 500 mcg  0.5 mg Nebulization Q6H PRN   ??? meropenem 1 g in sodium chloride 0.9% 120 mL IVPB  1 g Intravenous Q8H   ??? metoclopramide 5 mg/mL inj 5 mg  5 mg IV Push TID   ??? norepinephrine 32 mg in sodium chloride 0.9% 250 mL drip  0.1-1 mcg/kg/min (Dosing Weight) Intravenous Continuous   ??? oncology mouthwash soln 30 mL  30 mL Swish & Spit TID   ??? pantoprazole inj 40 mg  40 mg IV Push Q24H   ??? phenylephrine 100 mg in dextrose 5% 250 mL drip  40-200 mcg/min Intravenous Continuous   ??? polyethylene glycol pwd pkt 34 g  34 g Oral BID   ??? PrismaSate BGK 4/2.5 dialysate solution  5,000 mL/hr Dialysis Continuous   ??? propofol 10 mg/mL drip  75 mcg/kg/min (Dosing Weight) Intravenous Continuous   ??? senna tab 2 tablet  2 tablet Oral BID   ??? sodium chloride 0.9% IV soln bolus 1,000 mL  1,000 mL Intravenous PRN   ??? sodium chloride 0.9% IV soln  5-10 mL/hr Intravenous Continuous   ??? sodium chloride 0.9% IV soln  5-10 mL/hr Intravenous Continuous ??? sodium chloride 0.9% IV soln  5-10 mL/hr Intravenous Continuous   ??? thiamine tab 100 mg  100 mg Oral Daily   ??? vancomycin 750 mg in dextrose 5% 150 mL IVPB RTU  750 mg Intravenous Q12H   ??? vancomycin per pharmacy   Does not apply Per Protocol   ??? vasopressin 20 Units in sodium chloride 0.9% 100 mL drip  2.4 Units/hr Intravenous Continuous       Social History     Social History   ??? Marital status: Married     Spouse name: N/A   ??? Number of children: N/A   ??? Years of education: N/A     Occupational History   ??? Not on file.     Social History Main Topics   ??? Smoking status: Never Smoker   ??? Smokeless tobacco: Never Used   ??? Alcohol use 0.0 oz/week   ??? Drug use: No   ??? Sexual activity: Not on file     Other Topics Concern   ??? Not on file     Social History Narrative   ??? No narrative on file       Family History   Problem Relation Age of Onset   ??? Cancer Mother    ??? Cancer Father    ??? Malignant hyperthermia Neg Hx        Physical Examination:  BP 108/66 (BP Location: Left arm, Patient Position: Lying)  ~ Pulse 106  ~ Temp 36.9 ???C (98.4 ???F) (Axillary)  ~ Resp (!) 35  ~ Ht 6' 0.99'' (1.854 m)  ~ Wt (!) 254 lb 13.6 oz (115.6 kg)  ~ SpO2 (!) 87%  ~ BMI 33.63 kg/m???   I/O last 3 completed shifts:  In: 7971.2 [I.V.:3866.2; Other:190; NG/GT:2400; IV Piggyback:1515]  Out: 6936 [Urine:138; Other:6798]    General intubated and sedated lying on the bed   Chest; CTAB  Ext; 1+ edema   Dialysis Access: right femoral HD line   Family at bedside    Labs:    Lab Results   Component Value Date    WBC 5.20 03/12/2017    HGB 7.7 (L) 03/12/2017    HCT 23.1 (L) 03/12/2017    MCV 89.9 03/12/2017    PLT 77 (L) 03/12/2017  Lab Results   Component Value Date    NA 138 03/12/2017    K 4.7 03/12/2017    CL 100 03/12/2017    CO2 21 03/12/2017    BUN 25 (H) 03/12/2017    CREAT 1.12 03/12/2017    GLUCOSE 191 (H) 03/12/2017       Lab Results   Component Value Date    CALCIUM 9.1 03/12/2017    ICALCOR 1.15 03/12/2017    PHOS 2.5 03/12/2017 MG 2.2 (H) 03/12/2017    ALBUMIN 4.9 03/12/2017       Lab Results   Component Value Date    PHART 7.29 (L) 03/12/2017    PCO2ART 48 (H) 03/12/2017    PO2ART 70 (L) 03/12/2017    BICARBART 22.3 03/12/2017    BEART -4 (L) 03/12/2017    O2SATART 92.4 (L) 03/12/2017    FIO2ART 70 03/12/2017       Lab Results   Component Value Date    PHVEN 7.31 02/26/2017    PCO2VEN 60 02/26/2017    PO2VEN 48 02/26/2017    BICARBVEN 29.5 02/26/2017    BEVEN 2 02/26/2017       Imaging:  02/27/17 renal u/s  FINDINGS:  ???  Right kidney:  Length = 11.9 cm  Cortical thickness: Normal  Echogenicity: Normal  Collecting system: No hydronephrosis  Other findings: None  ???  Left kidney:  Length = 9.7 cm  Cortical thickness: Normal  Echogenicity: Normal  Collecting system: No hydronephrosis  Other findings: None  ???  Urinary bladder: Decompressed with a Foley catheter  ???  IMPRESSION:  ???  Normal renal ultrasound. No hydronephrosis.    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 Patient Started on palliative chemotherapy with carbo/etoposide 03/05/17. However, pt developed AKI. Baseline Creat is around 1.0 but it was peaked to 3.3 on the day of consult in 1/12/209.     # AKI could be due to multifactorial carboplatin induced or Tumor lysis syndrome Harmon Pier Bishop Score: positive Phos, urina acid increase and AKI  ) or sepsis. Given patient requires vasopressor and becoming anuric, will start CRRT.    # Hypoxemic respiratory failure now intubated, on ARDS net protocol with permissive hypercapnia.   # Metastatic small cell carcinoma   # COPD   # HFpEF  # Afib  # HTN  # DM2  # PAD s/p femoral graft  # Prior prostate CA    Recommendations: 1. Continue Nxstage at 5 liters/hr in setting of ARDS net protocol to correct pH to 7.4.     2. Check BMP, mag, and phos daily  3. Low Na, Low K, Low phos, 1L fluid restricted diet  4. Strict I&O and daily weights    -renally dose meds for GFR 20-30 while on CRRT  -avoid gadolinium exposure with MRI.      Discussed with attending physician.  Please call [phone] with any questions.  Will continue to follow.    Emiliano Dyer, MD 03/12/2017 2:34 PM  Nephrology Fellow

## 2017-03-12 NOTE — Consults
Patient remans in the MICU on the vent, sedated on gtts.  Plan for a family meeting today to discuss GOC.  Patient was started on CRRT.

## 2017-03-12 NOTE — Goals of Care
Goals of Care   A Goals of Care note documents discussions and decisions about future medical treatments including conversations to enhance prognostic awareness, discussions leading to or failing to yield a completed advance directive, patient preferences concerning future health states and treatments, conversations leading to a POLST, and nuanced discussions about future care goals or health states.    Met with pt's wife, daughter (DPOA), and son-in-law today. Also present was the ICU fellow Dr. Craig Guess and the oncology fellow Dr. Juel Burrow.     Went over pt's multiorgan failure - resp failure on near max vent settings, renal failure, and shock - all in setting of aggressive extensive SCLC s/p chemo a week ago without meaningful response. Overall prognosis extremely guarded and tried to explore goals of care - the family would like to speak about this amongst themselves and did voice that the patient did want everything done in the past but that they would discuss it in this new context of multiorgan failure and diagnosis of SCLC. All questions answered in detail - we also gave them options of changing resuscitation status vs continuing current care vs palliative care and can involve the palliative care team as well. They will get back to [place] soon with their decisions.    Ryan Shepard

## 2017-03-12 NOTE — Progress Notes
Hematology/Oncology Consult Progress Note   Patient name:  Ryan Shepard  MRN:  [id number]  DOB:  Apr 17, 1940  Location: 4435/A  Date of service:  03/12/2017    Reason for consult: Newly-diagnosed metastatic small cell carcinoma  Primary service: MICU  PCP: Sheppard Coil, MD    ID: 77 year-old former smoker (> 40 pk/yrs, quit ~20 years ago), COPD with chronic hypoxemic respiratory failure on 4L home oxygen, atrial fibrillation on chronic anticoagulation, HFpEF, HTN, T2DM, PAD and remote history of prostate cancer (s/p prostatectomy in 2000) who was admitted to the MICU on 12/28 with acute on chronic hypoxemic respiratory failure and as part of workup has been found to have metastatic small cell carcinoma, likely lung primary based on imaging, smoking history, and IHC. Started on palliative chemotherapy with carbo/etoposide 03/05/17.     Interval History/Subjective:  - On CRRT and phenylephrine 130 mcg/min and vasopressin at 2.4 units/hr. Current vent setting FiO2 90%, PEEP 12.    PHYSICAL EXAM     Last Recorded Vital Signs:    03/12/17 1200   BP:    Pulse: 121   Resp: 22   Temp: 36.9 ???C (98.4 ???F)   SpO2: (!) 88%       Intake/Output Summary (Last 24 hours) at 03/12/17 1238  Last data filed at 03/12/17 1200   Gross per 24 hour   Intake          5341.23 ml   Output             6686 ml   Net         -1344.77 ml      General: Intubated and sedated. On CRRT.   HEENT: Sclera anciteric. Conjunctiva pale. MMM.  CV: RRR. No frank m/r/g appreciated.  Pulm: Mechanically ventilated with symmetric chest rise. Coarse breath sounds.  Abd: +BS. Soft, mildly distended.  Ext: Trace lower extremity edema. No asymmetry noted.     OBJECTIVE DATA   LABS  Lab Results   Component Value Date    WBC 5.20 03/12/2017    NEUTABS 6.44 03/11/2017    HGB 7.7 (L) 03/12/2017    HCT 23.1 (L) 03/12/2017    PLT 77 (L) 03/12/2017     Lab Results   Component Value Date    PT 16.2 (H) 02/14/2017    INR 1.4 02/04/2017    APTT 66.7 (H) 03/12/2017 Lab Results   Component Value Date    NA 138 03/12/2017    K 4.7 03/12/2017    CL 100 03/12/2017    CO2 21 03/12/2017    BUN 25 (H) 03/12/2017    CREAT 1.12 03/12/2017    GLUCOSE 191 (H) 03/12/2017    CALCIUM 9.1 03/12/2017    MG 2.2 (H) 03/12/2017    PHOS 2.5 03/12/2017    LDH 334 (H) 03/12/2017    URICACID 1.3 (L) 03/12/2017     Lab Results   Component Value Date    APTT 66.7 (H) 03/12/2017    PT 16.2 (H) 02/20/2017    INR 1.4 01/27/2017     Lab Results   Component Value Date    ALT 21 03/12/2017    AST 52 (H) 03/12/2017    BILITOT 0.8 03/12/2017    ALKPHOS 85 03/12/2017    ALBUMIN 4.9 03/12/2017    TOTPRO 7.0 03/12/2017     Pertinent Microbiology:   1/5 L pleural fluid cx NGF  1/3 Resp cx Probable C.albicans  1/2 BCx x2 NGF  1/2 UA neg  12/31 BAL cx - neg  12/28 Resp Cx Probable C.albicans  12/28 Blood Cx NTD  12/28 RVP neg  12/18 Resp cx Neg (C albicans)  12/13 BAL Nocardia cx: Rare beaded Gram positive rods --> fast-growing mycobacterium  12/13 AFB cx Mycobacterium species  12/13 BAL cx Neg  12/13 Respiratory culture: Candida, otherwise negative so far; AFB smear negative x 2  12/13 Legionella culture neg  Aspergillus Ag EIA - ???<0.50  RVP panel negative  Aspergillus fumigatus (M3) IgE - NEG   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  Pertinent Imaging:   02/16/17 CT chest with contrast   - Showed prominent mediastinal and hilar lymphadenopathy, with encasement of left CCA and partial encasement of right innominate and left subclavian.   - Airspace and nodular consolidation prominent in LUL and lingula. Moderate left pleural effusion, with multiple pleural nodules. Small pericardial effusion.  ???  02/16/17 CT A/P with contrast - Showed at least 4 hypervascular liver lesions within right hepatic lobe, largest measuring 5.0 x 3.6 cm.    - Moderate atherosclerotic calcifications throughout abdominal vessels and renovascular calcifications. Patent fem-fem bypass. Multifocal arterial stenosis in femoral arteries.     02/27/17 CT chest, angiogram   - Did not show evidence of PE.   - Redemonstration of airspace and nodular consolidation, most prominent in LUL and lingula, bilateral mediastinal and lymphadenopathy.   - Interval increase in moderate left pleural effusion. Multiple pleural nodules. Interval increase in small pericardial effusion.   ???  Pertinent Pathology:   02/26/17 BAL    - Cytology was negative for malignant cells. Flow cytometry did not show evidence of lymphoproliferative disease.   ???  03/01/17 Biopsy of left liver lesion   - Metastatic poorly differentiated neuroendocrine carcinoma. Synaptophysin/chromogranin positive. TTF-1 patchy positive. PSA negative. Ki67 > 90%. Flow cytometry was limited due to low quantity of recoverable cells, but no discrete monotypic B-cell population seen.       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 Palliative chemotherapy with carbo/etoposide was started on 03/05/17.  ???  #Extensive-stage small cell carcinoma, likely lung primary, with metastases to pleura and liver (CT head negative for brain mets)  #AKI, likely multifactorial (preceded initiation of chemotherapy, but likely to be exacerbated by chemotherapy, possible tumor lysis, ongoing hypoxemic respiratory failure, and sepsis)  I discussed with the patient's wife and daughter the diagnosis and the rationale for initiation of therapy, as well as the potential associated risks. The goal of therapy is palliative. We will see how he does with induction and hope that his respiratory status will improve such that he can be extubated. Further treatment will be discussed pending possible response and clinical course. I discussed that there are significant risks associated with chemotherapy, but that  given his dire clinical situation, the potential benefit outweighs the risk. Risks include but are not limited to tumor lysis syndrome, leading to renal failure with potential need for initiation of dialysis, worsening of fluid overload that may exacerbate respiratory status, and neutropenic sepsis.   ???  Carboplatin IV AUC of 4 once D1 (dose reduced due to critical illness) --> done  Etoposide IV 80 mg/m2 D1-D3 (dose reduced due to critical illness and AKI) --> done  G-CSF following completion of chemotherapy   ???  The patient remains in critical condition, intubated/sedated with high ventilator requirements, on CRRT yesterday, and two vasopressors. There has not been clear signs of response to chemotherapy, as we would hope to see an improvement in his respiratory status. Other factors such as fluid overload and infection are contributing but are minor in comparison to cancer burden.     Family meeting was had today 1/14 with primary team, patient's wife, daughter, and son-in-law that further aggressive interventions are unlikely to be helpful, as he continues to deteriorate despite maximal support and trial of induction chemotherapy. In his current condition, we would not recommend additional chemotherapy, and recommend that his family reconsider his prior wishes to ''do everything'' as these are unlikely to be beneficial and may cause additional suffering.    - Recommend consulting Palliative Care.  - We will certainly be available for questions and discussions as needed.     Patient discussed with attending Dr. Clifton James. Please feel free to page [phone] with questions/concerns.  ???  Nydia Bouton  Hematology/Oncology Fellow  [phone]  ???

## 2017-03-12 NOTE — Nursing Note
5427 CRRT in progress per MD orders. Pressures within limits. No trending alarms noted. Will monitor with ICU RN.    [phone] Primary RN called, noted increasing venous, effluent and balance chamber pressures, filter clotting. Unable to return blood. Primary RN informed. Dialysis access checked, venous port sluggish when flushing, no blood return. Primary RN informed, requested TPA for access.    1815 TPA instilled in dialysis catheter. Machine primed with new filter. To be put back on CRRT after dwell time of 2 hours or more.

## 2017-03-12 NOTE — Nursing Note
1900 Report received   2000 Assessments done. Teaching is done. Medications given. Labs sent. Paged Md regarding insulin gtt and possible pressor change to improve hemodynamics. CRRT restarted.  2100 lab results reviewed paged to [place] and RT.  2200 labs sent.  2300 lab results reviewed and paged to Md and RT. Moved up on insulin gtt algorithm  0000 Assessments done. Teaching is done. meds given. Labs sent.   0100 lab results reviewed paged to Md and RT.  0200 Moved up on insuline gtt algorithm per protocol.  0400 Assessments done. Teaching is done. Labs sent. Medications given.  0500 lab results reviewed and paged to RT and Md.  0545 change the vent settings per ARDS NET.  0600 Updated Md. meds given.   2620 abg sent.

## 2017-03-12 NOTE — Progress Notes
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    -- Lines/Tubes --   ETT (size 8.75mm), PICC (RUE), A-line (right radial), NGT  -- Drips --  Fent, propofol, levophed, phenylephrine, vaso  -- Antimicrobials --  Levofloxacin, cefepime, Vanc  ???    IE/SUBJECTIVE:   IE:  - Added phenylephrine and increased vaso gtt overnight  .  Subjective:   - Intubated and sedated. Unable to assess  OBJECTIVE:   VITALS:  Temp:  [36.3 ???C (97.3 ???F)-36.7 ???C (98.1 ???F)] 36.7 ???C (98.1 ???F)  Heart Rate:  [125-135] 128  Resp:  [23-35] 35  Arterial Line BP (mmHg): (85-135)/(53-70) 93/56  MAP:  [51 mmHg-91 mmHg] 69 mmHg  FiO2 (%):  [70 %-90 %] 90 %  SpO2:  [83 %-94 %] 92 %  01/13 0701 - 01/14 0700  In: 5501.5 [I.V.:2506.5]  Out: 6184      Wt Readings from Last 1 Encounters:   03/11/17 (!) 115.6 kg (254 lb 13.6 oz)     Recent Labs      03/12/17   0755  03/12/17   0600  03/12/17   0506  03/12/17   0357   GLUCOSEPOC  169*  168*  153*  168*   }  Oxygen Therapy  SpO2: 92 %  O2 Device: ETT, Mechanical Ventilator  FiO2 (%): 90 %  O2 Flow Rate (L/min): 40 L/min     Vent Mode: PC  FiO2 (%):  [70 %-90 %] 90 %  Rate:  [35] 35  VT (mL):  [520 mL] 520 mL  PEEP (setting):  [12 cmH2O-16 cmH2O] 14 cmH2O  Pressure Control Level (cmH2O):  [26 cmH2O-30 cmH2O] 28 cmH2O    PHYSICAL EXAM:  General - Intubated/Sedated  Neuro - Sedated, +gag, pupils reactive  HEENT- oral ulcers on tongue w/minimal bleeding  Cardiovascular - RRR no m/r/g, unable to assess JVP d/t girth, 2+ radial pulses b/l  Lungs - No audible cuff leak, no wheezing anteriorly, mechanical breath sounds  Skin - No visible rashes or lesions  Abdomen - Central obesity, soft, NT/ND  Extremeties - Warm and well-perfused. 1+ LE edema    LABORATORY DATA:   BMP  Recent Labs      03/12/17   0400  03/11/17   2200  03/11/17   1620   NA  137  137  136   K  4.9  5.2  4.7   CL  100  102  99   CO2  20  19*  20   BUN  29*  36*  38*   CREAT  1.26  1.52*  1.30   CALCIUM  9.1  9.2  8.8   MG  2.2*  2.1*  2.1*   PHOS  2.6  3.4  2.8     CBC  Recent Labs      03/12/17   0400  03/11/17   0336  03/10/17   0334   WBC  5.20  9.06  11.37*   HGB  7.7*  8.9*  9.2*   HCT  23.1*  27.0*  28.4*   MCV  89.9  91.2  91.9   PLT  77*  136*  180     Coags  Recent Labs      03/12/17   0400  03/11/17   1134  03/11/17   0336   APTT  61.8*  89.7*  83.2*     Blood gas  Recent Labs      03/12/17   0638   PHART  7.33*   PCO2ART  43*   PO2ART  54*   BICARBART  22.3   BEART  -3*       Micro:    Microbiology:   12/31 BAL cx - neg or NGTD  12/28 Resp Cx Probable C.albicans  12/28 Blood Cx NTD  12/28 RVP neg  12/18 Resp Cx Neg (C albicans)  12/13 BAL Cx +mycobacteria  12/13 respiratory culture: candida, therwise negative so far; AFB smear negative x 2 (AFB cultures NTD)  12/13 legionella culture neg  Aspergillus Ag EIA - ???<0.50  RVP panel negative  Aspergillus fumigatus (M3) IgE - NEG   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  HIV - neg  Legionella Urinary Ag - neg  Chlamydia/gonorrhea PCR - neg  RPR nonreactive    IMAGING:   CXR (1/10):    ASSESSMENT & PLAN:   [name]???is a 77 y.o.???male???with a history of COPD (on 3LNC), HFpEF, afib, HTN, DM2, PAD s/p femoral graft, prior prostate [place] who was admitted for hypoxemic respiratory failure requiring intubation.    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 CT head negative for brain mets.  - Heme-onc consulted, appreciate recs.   - Family meeting with wife (DPOA), daughter on phone, primary ICU team and Heme-Onc on 1/7 PM. Discussed benefits/adverse effects of chemotherapy and possibility that this may help decr tumor burden in lung as possible bridge to extubation. Family agreed to initiation.  - Cont chemo (caroplatin/etoposide) initiated 1/7 PM  - Cont Allopurinol 300mg  qday (renal dosing)   - F/u G6PD testing in case rasburicase is needed  - TLS labs q12 (BMP, Phos, uric acid, LDH)  - Cont hep gtt  - per conversation w/heme-onc, don't expect full effect of chemo until 1 week, prognosis dependent on response to chemo, will talk about GOC at that point  - Family meeting today    RESP:  #Acute on chronic hypoxemic respiratory failure  Admitted from clinic requiring BiPAP in setting of decreased arterial PaO2 and desats to low 80s. Recently admitted w/ bronch showing chronic colonization with aspergillus. Has features c/f malignancy on imaging. Respiratory status continues to worsen with worsened xray. S/p upsized ETT on 1/1 w/ resolution of cuff leak. Initial c/f PE, CTA read no PE so d/c'd heparin gtt, LE dopplers negative for DVT. S/p Bronch on 1/10- demonstrated thin clear secretions.  - ARDS NET protocol  - Serial ABGs   - Malignancy tx, as below  - Antibiotics, as below    #Chronic COPD  Increased sputum production x 2 months,  slight wheezing and recently treated with steroids while hospitalized. Current presentation less c/w COPD exacerbation.  - Duonebs q6h  - Inhaled Flovent BID  - Empiric abx, as below  ???  CV:  #Hypovolemic shock in setting of sedation, improving  Patient requires significant sedation d/t air leak on cuff of ETT, patient changing position can increase air leak. Hypotension requiring pressors in setting of sedation but elevated lactate and signs of end-organ damage including rising Cr. Patient has leukocytosis and on empiric abx, no other signs of infection.  - A-line for accurate BP monitoring  - Pressors: cont phenylephrine, levo, and vaso gtts to allow room for CRRT clearance  - cont hydrocortisone 50mg  q6h  - cont florinef 0.05mg  qday  - S/p hydrocortisone and fludrocortisone (1/2-1/7)  - Trend lactate  - Goal MAPs > 65 on A-line    #Pericardial effusion: Small, stable    #HFpEF  Home dose lasix PO 40mg  BID. TTE shows hyperdynamic EF >75% and elevated PASP to .  - D/c albumin  - cont CRRT as below, goal neg 2-3L  - BID lytes, daily weights, strict I&O's     #Afib  Rate-controlled. CHADS2VaSC 3-4  - Hold home carvedilol 25 mg BID  - Hold home apixaban 5 mg BID  - hep gtt as above    RENAL:  #Acute renal failure:  - cont CRRT, goal neg 2-3L    NEURO:  #Intubated and sedated 2/2 hypoxemic respiratory failure  - Cont sedation with fentanyl/precedex/propofol  - Attempt sedation holiday as tolerated  - Repeat triglycerides q72h while on propofol    ID:  #Sepsis  Afebrile. Increased sputum production x 2 months otherwise no localizing signs of infection. Did receive course of methylprednisolone 12/18-12/23 on recent hospitalization. 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 +Mycobacteria 12/13 BAL resulted on 03/01/17, not nocardia as previously thought. On night of 1/10, febrile and tachycardic, and elevated procal 2.01.   - ID signed off, appreciate recs  - S/p IV Vanc  - S/p Azithromycin  - F/u urine histoplasma Ag (sendout lab), hold antifungal tx at this time  - S/p IV Zosyn  - S/p IV Bactrim 500mg  q8hr given that patient does not have pulmonary nocardia  - cont vancomycin (1/9-) for MRSA coverage  - cont mero (1/11-) for PsA coverage  - cont Caspofungin 50mg  qday, s/p 70mg  loading dose - s/p amikacin 10mg /kg x1 dose    - S/p flucon 400mg  qday (1/11-1/13) for empiric fungemic coverage  - S/p levofloxacin (1/2- 1/11)  - S/p IV cefepime q12h for GP coverage (1/6-1/10)  - S/p azithro (1/10)  - F/u BCx and fungal Cx from 1/10    CHRONIC  #HTN  - Hold home BP meds in setting of shock: nifedipine 30mg  daily, losartan 100 mg daily, carvedilol 25 mg BID  ???  #Chronic???pain   Localized to lower back and lower extremities  - Hold home Norco 5-325 q6h PRN while sedated  ???  #GERD chronic/stable  - Pantoprazole PO 40 mg daily 12/31  ???  #Depression/Anxiety  Mood stable, no SI, no anxiety  - Hold home buspirone 15 mg BID, duloxetine DR 60 while sedated  ???  #PAD  s/p femoral-femoral graft 2009.  - [place] home clopidogrel 75 mg daily  ???  #HLD chronic/stable  - Hold home Pravastatin 40 mg qhs  ???  #DM2 with associated diabetic peripheral neuropathy  - cont insulin gtt today  - Hold home gabapentin 800 mg TID   ???  F - cont TFs. q6h refeeding labs, supp K/Mg/Phos PRN  A - Sedated  S - Fent/precedex/propofol  T -  Hep gtt  H - 30 degrees  U - PPI BID  G - ISS#3, Accuchecks q4h  B - Senna qhs, suppository, miralax  I - ETT 8.24mm (upsided 1/1), PIV 22g R hand (12/28), CVC 3-Lumen L fem (1/1), A-line (12/31), OGT (12/31), PIV x 1 L hand    Disposition: ICU level care required for hypoxemic respiratory failure requiring intubation  ???  Code Status: Now DNR  Contact:  Primary Emergency Contact: Guastella,Johnnie, Home Phone: [phone]    DPOA is daughter Abel Hageman daughter [phone]    DWA Dr. Dot Been Refugio PGY1    Critical Care Attending Addendum:  ???  I have seen and examined and personally managed this patient who remains critically ill and high risk for clinical deterioration related to acute hypoxic resp failure in setting of underlying COPD, multifocal PNA, septic shock, acute renal failure with volume overload, and new dx of small cell lung CA with concurrent malignant effusion and pericardial effusion. ???  Remains very hypoxic on near max vent settings and ongoing shock on 2 pressors - switched to phenylephrine given tachycardia and pulling fluid with CRRT as tolerated. Continuing broad antimicrobial coverage and stress dose steroids and anticoagulation for afib. Prognosis guarded and family meeting held today (see separate note) with decision by family to make pt DNR. Continuing other ICU care for now and will continue to have conversations with family re: GOC pending his clinical course.   ???  Critical Care Time Personally Spent in Patient Care: 80 minutes exclusive of procedures.   ???  Celso Granja S. Regino Schultze

## 2017-03-13 LAB — Glucose,POC
GLUCOSE,POC: 177 mg/dL — ABNORMAL HIGH (ref 65–99)
GLUCOSE,POC: 204 mg/dL — ABNORMAL HIGH (ref 65–99)
GLUCOSE,POC: 146 mg/dL — ABNORMAL HIGH (ref 65–99)
GLUCOSE,POC: 137 mg/dL — ABNORMAL HIGH (ref 65–99)
GLUCOSE,POC: 247 mg/dL — ABNORMAL HIGH (ref 65–99)
GLUCOSE,POC: 249 mg/dL — ABNORMAL HIGH (ref 65–99)
GLUCOSE,POC: 251 mg/dL — ABNORMAL HIGH (ref 65–99)
GLUCOSE,POC: 220 mg/dL — ABNORMAL HIGH (ref 65–99)
GLUCOSE,POC: 186 mg/dL — ABNORMAL HIGH (ref 65–99)
GLUCOSE,POC: 121 mg/dL — ABNORMAL HIGH (ref 65–99)
GLUCOSE,POC: 132 mg/dL — ABNORMAL HIGH (ref 65–99)
GLUCOSE,POC: 137 mg/dL — ABNORMAL HIGH (ref 65–99)
GLUCOSE,POC: 151 mg/dL — ABNORMAL HIGH (ref 65–99)
GLUCOSE,POC: 137 mg/dL — ABNORMAL HIGH (ref 65–99)
GLUCOSE,POC: 161 mg/dL — ABNORMAL HIGH (ref 65–99)
GLUCOSE,POC: 137 mg/dL — ABNORMAL HIGH (ref 65–99)
GLUCOSE,POC: 147 mg/dL — ABNORMAL HIGH (ref 65–99)
GLUCOSE,POC: 141 mg/dL — ABNORMAL HIGH (ref 65–99)

## 2017-03-13 LAB — Sepsis Lactate Protocol: BLOOD LACTATE: 21 mg/dL (ref 5–25)

## 2017-03-13 LAB — Basic Metabolic Panel
CALCIUM: 8.2 mg/dL — ABNORMAL LOW (ref 8.6–10.4)
GFR ESTIMATE FOR AFRICAN AMERICAN: >89 mmol/L (ref 96–106)
POTASSIUM: 5.1 mmol/L (ref 3.6–5.3)
SODIUM: 137 mmol/L (ref 135–146)
UREA NITROGEN: 25 mg/dL — ABNORMAL HIGH (ref 7–22)
UREA NITROGEN: 19 mg/dL (ref 7–22)

## 2017-03-13 LAB — Lactate Dehydrogenase
LACTATE DEHYDROGENASE: 400 U/L — ABNORMAL HIGH (ref 125–256)
LACTATE DEHYDROGENASE: 565 U/L — ABNORMAL HIGH (ref 125–256)

## 2017-03-13 LAB — Blood Culture Detection
BLOOD CULTURE FINAL STATUS: NEGATIVE
BLOOD CULTURE FINAL STATUS: NEGATIVE
BLOOD CULTURE FINAL STATUS: NEGATIVE
BLOOD CULTURE FINAL STATUS: NEGATIVE
BLOOD CULTURE FINAL STATUS: NEGATIVE
BLOOD CULTURE FINAL STATUS: NEGATIVE

## 2017-03-13 LAB — Hepatic Funct Panel: TOTAL PROTEIN: 6.3 g/dL (ref 6.1–8.2)

## 2017-03-13 LAB — Calcium,Ionized
IONIZED CA++,CORRECTED: 1.22 mmol/L (ref 1.09–1.29)
IONIZED CA++,CORRECTED: 1.16 mmol/L (ref 1.09–1.29)
IONIZED CA++,CORRECTED: 1.2 mmol/L (ref 1.09–1.29)
IONIZED CA++,UNCORRECTED: 1.23 mmol/L (ref 1.09–1.29)

## 2017-03-13 LAB — APTT
APTT: 96.4 s — ABNORMAL HIGH (ref 24.4–36.2)
APTT: >180 s — CR (ref 24.4–36.2)

## 2017-03-13 LAB — Phosphorus
PHOSPHORUS: 2.3 mg/dL (ref 2.3–4.4)
PHOSPHORUS: 3.3 mg/dL (ref 2.3–4.4)
PHOSPHORUS: 2.4 mg/dL (ref 2.3–4.4)
PHOSPHORUS: 2.4 mg/dL (ref 2.3–4.4)

## 2017-03-13 LAB — Sepsis Lactate Repeat: BLOOD LACTATE: 20 mg/dL (ref 5–25)

## 2017-03-13 LAB — Blood Gases, arterial
BICARBONATE: 22.4 mmol/L (ref 22.0–26.0)
PH: 7.12 mmHg — CL (ref 7.37–7.41)
PH: 7.18 — CL (ref 7.37–7.41)
PH: 7.26 mmHg — ABNORMAL LOW (ref 7.37–7.41)
PO2: 67 mmHg — ABNORMAL LOW (ref 98–118)
PO2: 69 mmHg — ABNORMAL LOW (ref 98–118)
PO2: 87 mmHg — ABNORMAL LOW (ref 98–118)

## 2017-03-13 LAB — CBC: RED CELL DISTRIBUTION WIDTH-SD: 54.4 fL — ABNORMAL HIGH (ref 36.9–48.3)

## 2017-03-13 LAB — Blood Lactate: BLOOD LACTATE: 28 mg/dL — ABNORMAL HIGH (ref 5–25)

## 2017-03-13 LAB — Magnesium
MAGNESIUM: 2.2 meq/L — ABNORMAL HIGH (ref 1.4–1.9)
MAGNESIUM: 2.3 meq/L — ABNORMAL HIGH (ref 1.4–1.9)
MAGNESIUM: 2.2 meq/L — ABNORMAL HIGH (ref 1.4–1.9)
MAGNESIUM: 2.1 meq/L — ABNORMAL HIGH (ref 1.4–1.9)

## 2017-03-13 LAB — Bacterial Culture Respiratory

## 2017-03-13 LAB — Uric Acid
URIC ACID: 1 mg/dL — ABNORMAL LOW (ref 3.4–8.8)
URIC ACID: 1.1 mg/dL — ABNORMAL LOW (ref 3.4–8.8)

## 2017-03-13 LAB — Heparin Associated Plt Ab: HEPARIN ASSOCIATED PLT. AB: NEGATIVE

## 2017-03-13 LAB — Differential Automated: ABSOLUTE MONO COUNT: 0.32 10*3/uL (ref 0.20–0.80)

## 2017-03-13 LAB — Legionella Culture: LEGIONELLA CULTURE: NEGATIVE

## 2017-03-13 LAB — Vancomycin,trough: VANCOMYCIN,TROUGH: 14.2 ug/mL (ref 10.0–20.0)

## 2017-03-13 MED ADMIN — ONCOLOGY MOUTHWASH: 30 mL | ORAL | @ 20:00:00 | Stop: 2017-03-13 | NDC 00067628212

## 2017-03-13 MED ADMIN — PRISMASATE BGK 4/2.5 DIALYSATE SOLUTION: 12000 mL/h | INTRAVENOUS_CENTRAL | @ 10:00:00 | Stop: 2017-03-13 | NDC 04444000018

## 2017-03-13 MED ADMIN — PROPOFOL INFUSION 10 MG/ML: 7755 ug/min | INTRAVENOUS | Stop: 2017-03-14 | NDC 63323026965

## 2017-03-13 MED ADMIN — PANTOPRAZOLE SODIUM 40 MG IV SOLR: 40 mg | INTRAVENOUS | @ 08:00:00 | Stop: 2017-03-13 | NDC 55150020210

## 2017-03-13 MED ADMIN — HEPARIN 25,000 UNITS/250 ML 0.45% NACL RTU: 1250 [IU]/h | INTRAVENOUS | @ 02:00:00 | Stop: 2017-03-13 | NDC 00409765062

## 2017-03-13 MED ADMIN — POLYETHYLENE GLYCOL 3350 17 G PO PACK: 34 g | ORAL | @ 06:00:00 | Stop: 2017-03-13

## 2017-03-13 MED ADMIN — HYDROCORTISONE NA SUCCINATE PF 100 MG IJ SOLR: 50 mg | INTRAVENOUS | @ 18:00:00 | Stop: 2017-03-13 | NDC 00009001104

## 2017-03-13 MED ADMIN — PROPOFOL INFUSION 10 MG/ML: 7755 ug/min | INTRAVENOUS | @ 18:00:00 | Stop: 2017-03-14 | NDC 63323026965

## 2017-03-13 MED ADMIN — INSULIN REGULAR HUMAN 100 UNITS/100 ML DRIP FOR ICU DRIP: .540 [IU]/h | INTRAVENOUS | @ 03:00:00 | Stop: 2017-03-13

## 2017-03-13 MED ADMIN — PRISMASATE BGK 4/2.5 DIALYSATE SOLUTION: 12000 mL/h | INTRAVENOUS_CENTRAL | @ 20:00:00 | Stop: 2017-03-13 | NDC 04444000018

## 2017-03-13 MED ADMIN — CALCIUM CHLORIDE 2 GM IN 250 ML CRRT: 270 mL | INTRAVENOUS | @ 18:00:00 | Stop: 2017-03-13 | NDC 76329330401

## 2017-03-13 MED ADMIN — CHLORHEXIDINE GLUCONATE 0.12 % MT SOLN: 10 mL | OROMUCOSAL | @ 04:00:00 | Stop: 2017-03-13 | NDC 52376002110

## 2017-03-13 MED ADMIN — FENTANYL 2.5 MG/100 ML NS DRIP: 50 ug/h | INTRAVENOUS | @ 16:00:00 | Stop: 2017-03-13

## 2017-03-13 MED ADMIN — THIAMINE HCL 100 MG PO TABS: 100 mg | ORAL | @ 17:00:00 | Stop: 2017-03-13

## 2017-03-13 MED ADMIN — PRISMASATE BGK 4/2.5 DIALYSATE SOLUTION: 12000 mL/h | INTRAVENOUS_CENTRAL | @ 12:00:00 | Stop: 2017-03-13 | NDC 04444000018

## 2017-03-13 MED ADMIN — METOCLOPRAMIDE HCL 5 MG/ML IJ SOLN: 5 mg | INTRAVENOUS | @ 04:00:00 | Stop: 2017-03-13 | NDC 00409341401

## 2017-03-13 MED ADMIN — ALLOPURINOL 300 MG PO TABS: 300 mg | ORAL | @ 17:00:00 | Stop: 2017-03-13 | NDC 51079020620

## 2017-03-13 MED ADMIN — SENNOSIDES 8.6 MG PO TABS: 2 | ORAL | @ 06:00:00 | Stop: 2017-03-13

## 2017-03-13 MED ADMIN — METOCLOPRAMIDE HCL 5 MG/ML IJ SOLN: 5 mg | INTRAVENOUS | @ 20:00:00 | Stop: 2017-03-13 | NDC 00409341401

## 2017-03-13 MED ADMIN — PRISMASATE BGK 4/2.5 DIALYSATE SOLUTION: 12000 mL/h | INTRAVENOUS_CENTRAL | @ 06:00:00 | Stop: 2017-03-13 | NDC 04444000018

## 2017-03-13 MED ADMIN — MEROPENEM 100 ML NS IVPB: 1 g | INTRAVENOUS | @ 09:00:00 | Stop: 2017-03-13 | NDC 63323050830

## 2017-03-13 MED ADMIN — VASOPRESSIN 20 UNITS, 40 UNITS/100 ML DRIP: 2.4 [IU]/h | INTRAVENOUS | @ 08:00:00 | Stop: 2017-03-13 | NDC 42023016425

## 2017-03-13 MED ADMIN — PRISMASATE BGK 4/2.5 DIALYSATE SOLUTION: 12000 mL/h | INTRAVENOUS_CENTRAL | @ 10:00:00 | Stop: 2017-03-13

## 2017-03-13 MED ADMIN — PRISMASATE BGK 4/2.5 DIALYSATE SOLUTION: 12000 mL/h | INTRAVENOUS_CENTRAL | @ 14:00:00 | Stop: 2017-03-13 | NDC 04444000018

## 2017-03-13 MED ADMIN — ONCOLOGY MOUTHWASH: 30 mL | ORAL | @ 04:00:00 | Stop: 2017-03-13 | NDC 00067628212

## 2017-03-13 MED ADMIN — HYDROCORTISONE NA SUCCINATE PF 100 MG IJ SOLR: 50 mg | INTRAVENOUS | @ 06:00:00 | Stop: 2017-03-13 | NDC 00009001104

## 2017-03-13 MED ADMIN — PROPOFOL INFUSION 10 MG/ML: 7755 ug/min | INTRAVENOUS | @ 13:00:00 | Stop: 2017-03-14 | NDC 63323026965

## 2017-03-13 MED ADMIN — HEPARIN 25,000 UNITS/250 ML 0.45% NACL RTU: 1250 [IU]/h | INTRAVENOUS | @ 14:00:00 | Stop: 2017-03-13 | NDC 00409765062

## 2017-03-13 MED ADMIN — PHENYLEPHRINE 50, 100 MG/250 ML DRIP: 40200 ug/min | INTRAVENOUS | @ 08:00:00 | Stop: 2017-03-13 | NDC 76014000410

## 2017-03-13 MED ADMIN — PROPOFOL INFUSION 10 MG/ML: 7755 ug/min | INTRAVENOUS | @ 08:00:00 | Stop: 2017-03-14 | NDC 63323026965

## 2017-03-13 MED ADMIN — ONCOLOGY MOUTHWASH: 30 mL | ORAL | @ 13:00:00 | Stop: 2017-03-13 | NDC 00067628212

## 2017-03-13 MED ADMIN — FENTANYL 2.5 MG/100 ML NS DRIP: 50 ug/h | INTRAVENOUS | @ 14:00:00 | Stop: 2017-03-13

## 2017-03-13 MED ADMIN — PRISMASATE BGK 4/2.5 DIALYSATE SOLUTION: 12000 mL/h | INTRAVENOUS_CENTRAL | @ 16:00:00 | Stop: 2017-03-13 | NDC 04444000018

## 2017-03-13 MED ADMIN — METOCLOPRAMIDE HCL 5 MG/ML IJ SOLN: 5 mg | INTRAVENOUS | @ 13:00:00 | Stop: 2017-03-13 | NDC 00409341401

## 2017-03-13 MED ADMIN — INSULIN REGULAR HUMAN 100 UNITS/100 ML DRIP FOR ICU DRIP: .540 [IU]/h | INTRAVENOUS | @ 18:00:00 | Stop: 2017-03-13

## 2017-03-13 MED ADMIN — MAGNESIUM SULFATE 2 GM/50ML IV SOLN: 2 g | INTRAVENOUS | @ 20:00:00 | Stop: 2017-03-13 | NDC 63323010605

## 2017-03-13 MED ADMIN — PRISMASATE BGK 4/2.5 DIALYSATE SOLUTION: 12000 mL/h | INTRAVENOUS_CENTRAL | @ 08:00:00 | Stop: 2017-03-13

## 2017-03-13 MED ADMIN — VASOPRESSIN 20 UNITS, 40 UNITS/100 ML DRIP: 2.4 [IU]/h | INTRAVENOUS | @ 16:00:00 | Stop: 2017-03-13 | NDC 42023016425

## 2017-03-13 MED ADMIN — FENTANYL 2.5 MG/100 ML NS DRIP: 50 ug/h | INTRAVENOUS | @ 18:00:00 | Stop: 2017-03-14 | NDC 00409909461

## 2017-03-13 MED ADMIN — PRISMASATE BGK 4/2.5 DIALYSATE SOLUTION: 12000 mL/h | INTRAVENOUS_CENTRAL | @ 02:00:00 | Stop: 2017-03-13 | NDC 04444000018

## 2017-03-13 MED ADMIN — PHENYLEPHRINE 50, 100 MG/250 ML DRIP: 40200 ug/min | INTRAVENOUS | @ 16:00:00 | Stop: 2017-03-13 | NDC 76014000410

## 2017-03-13 MED ADMIN — PRISMASATE BGK 4/2.5 DIALYSATE SOLUTION: 12000 mL/h | INTRAVENOUS_CENTRAL | @ 09:00:00 | Stop: 2017-03-13 | NDC 04444000018

## 2017-03-13 MED ADMIN — SENNOSIDES 8.6 MG PO TABS: 2 | ORAL | @ 15:00:00 | Stop: 2017-03-13

## 2017-03-13 MED ADMIN — VANCOMYCIN 750 MG/150 ML RTU: 750 mg | INTRAVENOUS | @ 07:00:00 | Stop: 2017-03-13 | NDC 00338358048

## 2017-03-13 MED ADMIN — HEPARIN 25,000 UNITS/250 ML 0.45% NACL RTU: 1250 [IU]/h | INTRAVENOUS | @ 16:00:00 | Stop: 2017-03-13 | NDC 00409765062

## 2017-03-13 MED ADMIN — CALCIUM CHLORIDE 2 GM IN 250 ML CRRT: 270 mL | INTRAVENOUS | Stop: 2017-03-13 | NDC 76329330401

## 2017-03-13 MED ADMIN — HYDROCORTISONE NA SUCCINATE PF 100 MG IJ SOLR: 50 mg | INTRAVENOUS | Stop: 2017-03-13 | NDC 00009001104

## 2017-03-13 MED ADMIN — HYDROCORTISONE NA SUCCINATE PF 100 MG IJ SOLR: 50 mg | INTRAVENOUS | @ 12:00:00 | Stop: 2017-03-13 | NDC 00009001104

## 2017-03-13 MED ADMIN — CHLORHEXIDINE GLUCONATE 0.12 % MT SOLN: 10 mL | OROMUCOSAL | @ 17:00:00 | Stop: 2017-03-13 | NDC 52376002110

## 2017-03-13 MED ADMIN — NOREPINEPHRINE 8, 16, 32 MG/250 ML DRIP: 10.34103.4 ug/min | INTRAVENOUS | @ 10:00:00 | Stop: 2017-03-13 | NDC 36000016210

## 2017-03-13 MED ADMIN — FLUDROCORTISONE ACETATE 0.1 MG PO TABS: .05 mg | OROGASTRIC | @ 17:00:00 | Stop: 2017-03-13 | NDC 00115703301

## 2017-03-13 MED ADMIN — FILGRASTIM-SNDZ 480 MCG/0.8ML IJ SOSY: 480 ug | SUBCUTANEOUS | @ 17:00:00 | Stop: 2017-03-14 | NDC 61314032610

## 2017-03-13 MED ADMIN — MEROPENEM 100 ML NS IVPB: 1 g | INTRAVENOUS | @ 02:00:00 | Stop: 2017-03-13 | NDC 63323050830

## 2017-03-13 MED ADMIN — VANCOMYCIN 750 MG/150 ML RTU: 750 mg | INTRAVENOUS | @ 21:00:00 | Stop: 2017-03-13 | NDC 00338358048

## 2017-03-13 MED ADMIN — PROPOFOL INFUSION 10 MG/ML: 7755 ug/min | INTRAVENOUS | @ 23:00:00 | Stop: 2017-03-14 | NDC 63323026965

## 2017-03-13 MED ADMIN — SODIUM CHLORIDE 0.9% IV SOLN (250 ML): 510 mL/h | INTRAVENOUS | Stop: 2017-03-14 | NDC 00338630402

## 2017-03-13 MED ADMIN — MEROPENEM 100 ML NS IVPB: 1 g | INTRAVENOUS | @ 18:00:00 | Stop: 2017-03-13 | NDC 63323050830

## 2017-03-13 MED ADMIN — POLYETHYLENE GLYCOL 3350 17 G PO PACK: 34 g | ORAL | @ 15:00:00 | Stop: 2017-03-13

## 2017-03-13 MED ADMIN — INSULIN REGULAR HUMAN 100 UNITS/100 ML DRIP FOR ICU DRIP: .540 [IU]/h | INTRAVENOUS | @ 07:00:00 | Stop: 2017-03-13 | NDC 00338004918

## 2017-03-13 NOTE — Progress Notes
PALLIATIVE CARE (PC) NOTE    Date PC Consult: 03/13/2017  Reason for Consult: Goals of Care    Identifying Information: Ryan Shepard is a 77 y.o. 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    Description:  PC Team met with Pt.'s daughter and wife today for initial consult. Pt is currently intubated and unable to participate in discussion. Please see Goals of Care note dated 03/13/2017 for further details of discussion. Of note, Pt.'s daughter is DPOA; however Pt.'s wife and daughter make decisions together. We provided extensive emotional support to Pt.'s family, as Pt has had a rapid and sudden decline. They shared how shocking this has been for them, and that Pt had previously told them he would want as much done as possible to give him the best shot at recovery. Pt.'s poor prognosis of hours to days was discussed, and Pt.'s family was shocked and emotional regarding this news. We talked extensively about the different options of continuing current level of care or transitioning to a comfort oriented approach. They shared how Pt is a very personable person, and it is difficult for them to see him this way. They shared he loved Blues music, and PC Team played Blues music in the room. Also discussed initiating 3 wishes with team. Initially, Pt's family wanted to discuss amongst themselves before making a decision. Our team was later called to speak with family, and they had decided to proceed with palliative extubation and comfort measures only. PC Team met with them again and provided emotional support. Also discussed 3 wishes with Pt.'s family and they were happy to receive fingerprints and for Pt.'s favorite music to be playing. Support was provided throughout both discussions utilizing a strengths based perspective and normalizing feelings.      PC CSW will continue to follow and assist as needed in coordination with the primary team.   ???    Ronette Deter, LCSW  Palliative Care Social Worker  [phone]  [email or web address]

## 2017-03-13 NOTE — Nursing Note
CRRT rounds completed. Orders verified with primary RN at bedside. Machine pressures within normal limits.     Dialysis can only run at 11.4L/hr due to 200 mL/min max BFR. MD notified.

## 2017-03-13 NOTE — Nursing Note
0700: Patient desatting to the 60s, manual ambubagging improved sats to the 90s. MICU team notified, at bedside.     0900: MICU team rounds, family updated.     1130: Palliative Care at bedside to speak with family.     1300: Three wishes introduced to family and was very receptive. See Significant event note for details. Family requests to meet with Palliative Care team again. PC team paged and notified, will come by again later this PM. Unit chaplain also came by to visit.    1400: Desat to the 70s after vent changes, RT at bedside and MICU resident. Family spoke with Dr. Tobias Alexander indicating their request to focus on comfort based care.     1445: EOL orders in, but family requests to hold off on initiating it and wait for the rest of the family to come in. MICU team notified.     1810Romie Levee, patient's daughter and DPOA at bedside, process for terminal extubation explained, verbalized understanding and indicated consent and readiness. Dr. Marianna Payment came to bedside to also talk to patient's family. All needs and questions addressed. Vasopressors all off    1830: CRRT discontinued.

## 2017-03-13 NOTE — Significant Event
[place] 3 Wishes Project        Ryan Shepard is participating in the 3 Wishes Project where the 3 wishes team attempts to fulfill the family/patient's last wishes.  For this patient, 3 Wishes was initiated by the Bedside RN and Palliative Care.      The wishes were:    1. Blues music  Wished by: Family  Wish implemented by: ICU Nurse, Palliative Care and Family  Wish was fulfilled: [place]     2. Finger print stamps  Wished by: Family  Wish implemented by: ICU Nurse  Wish was fulfilled: Ante Mort[place]       Fulfillment Status of Wishes (if wish not fulfilled, please state why):  Family only indicated 2 wishes, both of which were fulfilled. Last wish was to wait for more family members to come in and say their last words to [name].       Name, phone #, and address of family member (for 3 Wishes Team to send sympathy card and possible follow-up):  Dayton Scrape (wife) [phone]  Liz Beach (daughter) [phone]    Electronically signed by:    Pete Pelt, RN  3 Wishes Team  03/13/2017  3 Wishes Pager [phone]

## 2017-03-13 NOTE — Consults
PALLIATIVE CARE INITIAL CONSULT  MRN: [id number] CSN: [id number] Date of Admission: 02/21/2017  Date of Service:  03/13/2017 Hospital Day: 72  Primary Team Attending: Suzzette Righter., MD  Referring Provider: Dr. Regino Schultze    Reason for Consult: Goals of care discussion/ACP  Primary Diagnosis leading to Consult: Cancer (solid tumor)     HISTORY OF PRESENT ILLNESS  Ryan Shepard is a 77 y.o. 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    The patient was admitted on 02/24/17 for respiratory failure requiring BiPAP. The patient was treated for infectious pneumonia with antibiotics, although additional work-up revealed metastatic small cell carcinoma with suspected lung primary, for which the patient received palliative chemotherapy with carbo/etoposide on 03/05/17 as an attempt to improve the patient's lung function. During this time, the patient's respiratory failure worsened requiring intubation, the patient developed an AKI, and the patient eventually required CRRT for volume removal.     Currently the patient is sedated, intubated, on three pressors, and CRRT. Per our discussion with the ICU team, the patient's prognosis can likely be measured in days even with continued care, given extent of the patient's multiorgan failure.     Our team with with the patient's family (daughter, wife) at bedside today. They shared with [place] that this has been a rapid and sudden decline for the patient and that they were aware that he is very ill. They shared with [place] that the patient had previously stated that he would want as much done as possible to give him the best shot at recovery and that they have been using this prior discussion they've had to guide their decisions. Our team shared with the patient that given the patient's decline and extent of disease and life support requirements, and based on our team's own discussion with the ICU team, that the patient's prognosis could be measured in hours to days. The patient's family was understandably surprised and emotional at being given this news.   We talked about the different options for the patient and discussed that with continued aggressive care the patient could have a few more days. We also discussed what care given with a priority on comfort would look like (e.g. Giving medications for comfort, discontinuing IV medications for comfort purposes, palliative extubation, etc.).   The family initially stated that they wanted to think about this decision. Our team was later called to speak to the family. On arrival, the family stated that they wished to proceed with palliative extubation and comfort care. Emotional support was provided to the family.    A family meeting was held today:  yes  Symptom Asssesment  Pain: assessed but patient was not able to report  Anxiety: assessed but patient was not able to report  Nausea: assessed but patient was not able to report  Dyspnea:assessed but patient was not able to report  Stool Occurrence: 1 within past 24 hours    REVIEW OF SYSTEMS: 14 point ROS unable to be obtained     Current Facility-Administered Medications   Medication Dose Route Frequency   ??? acetaminophen tab 650 mg  650 mg Oral Q6H PRN    Or   ??? acetaminophen 650 mg supp 650 mg  650 mg Rectal Q6H PRN   ??? diphenhydrAMINE cap 25 mg  25 mg  Oral Q4H PRN    Or   ??? diphenhydrAMINE 50 mg/mL inj 25 mg  25 mg IV Push Q4H PRN    Or   ??? diphenhydrAMINE 50 mg/mL inj 25 mg  25 mg Subcutaneous Q4H PRN   ??? fentaNYL (PF) 100 mcg/2 mL inj 25 mcg  25 mcg Intravenous Q5 min PRN   ??? fentaNYL (PF) 2,500 mcg in sodium chloride 0.9% 100 mL drip  50 mcg/hr Intravenous Continuous ??? filgrastim-sndz 480 mcg/0.8 mL inj 480 mcg  480 mcg Subcutaneous Daily   ??? glycopyrrolate 0.2 mg/mL inj 0.4 mg  0.4 mg IV Push Q4H PRN    Or   ??? glycopyrrolate 0.2 mg/mL inj 0.4 mg  0.4 mg Subcutaneous Q4H PRN   ??? LORazepam tab 1 mg  1 mg Oral Q1H PRN    Or   ??? LORazepam 2 mg/mL inj 1 mg  1 mg IV Push Q1H PRN    Or   ??? LORazepam 2 mg/mL inj 1 mg  1 mg Subcutaneous Q1H PRN   ??? LORazepam 2 mg/mL inj 4 mg  4 mg IV Push Q5 min PRN    Or   ??? LORazepam 2 mg/mL inj 4 mg  4 mg Subcutaneous Q5 min PRN   ??? polyvinyl alcohol 1.4% oph soln 2 drop  2 drop Both Eyes Q2H PRN   ??? propofol 10 mg/mL drip  75 mcg/kg/min (Dosing Weight) Intravenous Continuous   ??? sodium chloride 0.9% IV soln  5-10 mL/hr Intravenous Continuous   ??? sodium chloride 0.9% IV soln  5-10 mL/hr Intravenous Continuous   ??? sodium chloride 0.9% IV soln  5-10 mL/hr Intravenous Continuous       PAST MEDICAL HISTORY  Ryan Shepard has a past medical history of Cancer (HCC/RAF); Diabetes (HCC/RAF); GERD (gastroesophageal reflux disease); Hypercholesteremia; Hypertension; PAD (peripheral artery disease) (HCC/RAF); Partial nontraumatic amputation of foot (HCC/RAF); Prostate disease; Scoliosis; and Vascular disease.  PAST SURGICAL HISTORY  Ryan Shepard has a past surgical history that includes BILATERAL FEMORAL ARTERY EXPLORATION (2009); Bladder surgery (1999); Prostate surgery (2000); and Back surgery.  ALLERGIES  Patient has no known allergies.  FAMILY HISTORY  family history includes Cancer in his father and mother.  SOCIAL HISTORY  Ryan Shepard  reports that he has never smoked. He has never used smokeless tobacco. He reports that he drinks alcohol. He reports that he does not use drugs.    PHYSICAL EXAM  BP 108/66 (BP Location: Left arm, Patient Position: Lying)  ~ Pulse 101  ~ Temp 34.8 ???C (94.6 ???F) (Axillary)  ~ Resp 14  ~ Ht 1.854 m (6' 0.99'')  ~ Wt (!) 115.6 kg (254 lb 13.6 oz)  ~ SpO2 (!) 71%  ~ BMI 33.63 kg/m??? General: [name], chronically ill-appearing, intubated, sedated   Head/Ears/Eyes/Nose: normocephalic, atraumatic   Oropharynx: ETT in place   Lungs: ventilated  Cardiac: tachycardic   Extremities: warm, perfused   Neuro: sedated     LABS AND STUDIES  I reviewed current labs and studies.  WBC/Hgb/Hct/Plts:  3.55/7.8/25.4/69 (01/15 0413)  Na/K/Cl/CO2/BUN/Cr/glu:  137/4.7/101/21/19/0.89/136 (01/15 1012)  Ca/Mg/PO4:  8.4/2.1/2.4 (01/15 1012)    Results in Past 90 Days  Result Component Current Result   QTC Calculation (Bezet) 456 (03/05/2017)     ADVANCE CARE PLANNING  On assessment today, the patient does not have decision making capacity regarding complex medical decisions.    DPOA/surrogate:  Primary Emergency Contact: Ghan,Johnnie, Home Phone: [phone]  Advance Directive in chart:  yes  POLST in chart:  no  Goals of care and treatment preferences: Refer to GOC Note     PERFORMANCE STATUS AND PROGNOSIS  Performance Status at time of consult: Palliative Performance Scale: 10%  Prognosis: hours    IMPRESSION/RECOMMENDATIONS  Ryan Shepard is a 77 y.o. 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    # GOC: See GOC note from 03/13/17. The patient has multiorgan failure in the setting of metastatic small cell carcinoma. The patient's multiorgan failure has progressed despite extensive treatment (mechanical venitlation, CRRT, three pressors). Given the patient's poor and worsening prognosis, the family has opted for comfort care and palliative extubation.   - Plan for palliative extubation, discontinuing pressors/CRRT, and initiating comfort care  - Continue only those medications/treatments intended for comfort; would discontinue medications/interventions that do not provide comfort (e.g. Antibiotics, IV draws, etc.)   - DPOA: 2014, Glynda (daughter). Daughter and wife make decisions together.   - Three wishes initiated by ICU team  - DNR    # Palliative extubation  - Continue fentanyl 256mcg/hour   - Would pre-medicate 20-30 minutes prior to extubation with fentanyl, 2mg  ativan, 0.4mg  glycopyrrolate. Would deep suction prior to extubation.     # Dyspnea/Pain:   - Continue fentanyl 241mcg/hour  - Recommend fentanyl q69minutes prn pain or dyspnea  - Recommend ativan 1mg  q1 hour prn agitation/anxiety/dyspnea (may increase to 2mg )     # Secretions  - Recommend glycopyrrolate 0.4mg  q4h prn secretions/deathrattle    Thank you for this consult, we'll continue to follow. This was a shared visit with Dr. Ancil Linsey.     -----  I provided palliative care for the primary diagnosis of multiorgan failure, metastatic small cell carcinoma and the symptoms resulting from this condition    We discussed the patient at interdisciplinary rounds today with the Palliative Team (which can include CNS, SW, and/or Chaplain).      Electronically signed by:    Wilhelmenia Blase, MD  Palliative Care  03/13/2017  Palliative Service Pager RR [phone]    Attending Addendum  Date of Service: 03/13/2017    I performed a history and physical exam and discussed the patient's management with the fellow.  I reviewed the fellow's note and agree with the documented findings and the plan of care we developed.    Electronically Signed by:  Hilda Lias, MD  Palliative Service Pager RR [phone], [facility] [phone]  Personal Pager [phone]

## 2017-03-13 NOTE — Other
Patients Clinical Goal:   Clinical Goal(s) for the Shift: keep MAP>65, SpO2>88<92% ARDS NET, CRRT goal 2L-3L negative or as tolerated, monitor insuline gtt per protocol,Labs, CHG, teaching and update of family members.  Identify possible barriers to advancing the care plan  Stability of the patient: Unstable - high likelihood or risk of patient condition declining or worsening; patient condition declining or worsening    End of Shift Summary: MAP>65 on multiple pressors, SpO2>88<92% ARDS NET, CRRT was titrated per renal team at 12L/hr now removal at the goal, insulin gtt titrated per protocol pt is on algorithm #5, labs sent per Md order, CHG is done, pt family updated and teaching provided.

## 2017-03-13 NOTE — Progress Notes
MICU PROGRESS NOTE    PATIENT: Ryan Shepard       MRN: [id number]      DATE OF SERVICE: 03/13/2017         HOSPITAL DAY: 18  PRIMARY CARE PROVIDER: Sheppard Coil, MD  ATTENDING: Suzzette Righter., MD    DPOA: Lenorris Karger daughter [phone]    ID: [name], HFpEF, afib, HTN, DM2, PAD s/p femoral graft, prior prostate CA who was admitted to the ICU for hypoxemic respiratory failure requiring BiPAP.    -- Lines/Tubes --   ETT (size 8.86mm), PICC (RUE), A-line (right radial), NGT  -- Drips --  Fent, propofol, levophed, phenylephrine, vaso  -- Antimicrobials --  Levofloxacin, cefepime, Vanc  ???    IE/SUBJECTIVE:   IE:  - Overnight VBGs worsening, switched to VC but peak pressures in 50s so transitioned back to PC, but VBGs not much improved  .  Subjective:   - Intubated and sedated. Unable to assess  OBJECTIVE:   VITALS:  Temp:  [34.8 ???C (94.6 ???F)-36.1 ???C (97 ???F)] 34.8 ???C (94.6 ???F)  Heart Rate:  [98-135] 101  Resp:  [14-40] 14  Arterial Line BP (mmHg): (86-129)/(43-57) 94/47  MAP:  [59 mmHg-79 mmHg] 64 mmHg  FiO2 (%):  [80 %-100 %] 100 %  SpO2:  [71 %-98 %] 71 %  01/14 0701 - 01/15 0700  In: 5117.3 [I.V.:2892.3]  Out: 8271      Wt Readings from Last 1 Encounters:   03/11/17 (!) 115.6 kg (254 lb 13.6 oz)     Recent Labs      03/13/17   1255  03/13/17   1153  03/13/17   1013  03/13/17   0756   GLUCOSEPOC  141*  147*  137*  161*   }  Oxygen Therapy  SpO2: (!) 71 %  O2 Device: ETT, Mechanical Ventilator  FiO2 (%): 100 %  O2 Flow Rate (L/min): 40 L/min     Vent Mode: PC  FiO2 (%):  [80 %-100 %] 100 %  Rate:  [35-40] 40  PEEP (setting):  [8 cmH2O-14 cmH2O] 12 cmH2O  Pressure Control Level (cmH2O):  [28 cmH2O-34 cmH2O] 34 cmH2O    PHYSICAL EXAM:  General - Intubated/Sedated  Neuro - Sedated, +gag, pupils reactive  HEENT- oral ulcers on tongue w/minimal bleeding  Cardiovascular - RRR no m/r/g, unable to assess JVP d/t girth, 2+ radial pulses b/l Lungs - No audible cuff leak, no wheezing anteriorly, mechanical breath sounds  Skin - No visible rashes or lesions  Abdomen - Central obesity, soft, NT/ND  Extremeties - Warm and well-perfused. 1+ LE edema    LABORATORY DATA:   BMP  Recent Labs      03/13/17   1012  03/13/17   0413  03/12/17   2200   NA  137  138  137   K  4.7  4.8  5.1   CL  101  101  98   CO2  21  20  20    BUN  19  20  24*   CREAT  0.89  0.86  1.03   CALCIUM  8.4*  8.2*  9.1   MG  2.1*  2.2*  2.3*   PHOS  2.4  2.4  3.3     CBC  Recent Labs      03/13/17   0413  03/12/17   0400  03/11/17   0336  WBC  3.55*  5.20  9.06   HGB  7.8*  7.7*  8.9*   HCT  25.4*  23.1*  27.0*   MCV  92.4  89.9  91.2   PLT  69*  77*  136*     Coags  Recent Labs      03/13/17   0422  03/12/17   1751  03/12/17   1058   APTT  >180.0*  96.4*  66.7*     Blood gas  Recent Labs      03/13/17   1209   PHART  7.19*   PCO2ART  64*   PO2ART  87*   BICARBART  23.7   BEART  -4*       Micro:    Microbiology:   12/31 BAL cx - neg or NGTD  12/28 Resp Cx Probable C.albicans  12/28 Blood Cx NTD  12/28 RVP neg  12/18 Resp Cx Neg (C albicans)  12/13 BAL Cx +mycobacteria  12/13 respiratory culture: candida, therwise negative so far; AFB smear negative x 2 (AFB cultures NTD)  12/13 legionella culture neg  Aspergillus Ag EIA - ???<0.50  RVP panel negative  Aspergillus fumigatus (M3) IgE - NEG   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  HIV - neg  Legionella Urinary Ag - neg  Chlamydia/gonorrhea PCR - neg  RPR nonreactive    IMAGING:   CXR (1/10):    ASSESSMENT & PLAN:   [name]???is a 77 y.o.???male???with a history of COPD (on 3LNC), HFpEF, afib, HTN, DM2, PAD s/p femoral graft, prior prostate [place] who was admitted for hypoxemic respiratory failure requiring intubation. 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 CT head negative for brain mets.  - Heme-onc consulted, appreciate recs.   - Family meeting with wife (DPOA), daughter on phone, primary ICU team and Heme-Onc on 1/7 PM. Discussed benefits/adverse effects of chemotherapy and possibility that this may help decr tumor burden in lung as possible bridge to extubation. Family agreed to initiation.  - Cont chemo (caroplatin/etoposide) initiated 1/7 PM  - Cont Allopurinol 300mg  qday (renal dosing)   - F/u G6PD testing in case rasburicase is needed  - TLS labs q12 (BMP, Phos, uric acid, LDH)  - Cont hep gtt  - per conversation w/heme-onc, don't expect full effect of chemo until 1 week, prognosis dependent on response to chemo, will talk about GOC at that point  - consult Palliative care today    #Thrombocytopenia  Query HIT given pt on heparin gtt. No clinical signs of DIC.  - HIT studies sent  - DC heparin gtt    RESP:  #Acute on chronic hypoxemic respiratory failure - worsening  Admitted from clinic requiring BiPAP in setting of decreased arterial PaO2 and desats to low 80s. Recently admitted w/ bronch showing chronic colonization with aspergillus. Has features c/f malignancy on imaging. Respiratory status continues to worsen with worsened xray. S/p upsized ETT on 1/1 w/ resolution of cuff leak. Initial c/f PE, CTA read no PE so d/c'd heparin gtt, LE dopplers negative for DVT. S/p Bronch on 1/10- demonstrated thin clear  secretions. VBGs acutely worsening overnight w/elevated RRs.  - ARDS NET protocol  - Serial ABGs   - Malignancy tx, as below  - Antibiotics, as below    #Chronic COPD  Increased sputum production x 2 months, slight wheezing and recently treated with steroids while hospitalized. Current presentation less c/w COPD exacerbation.  - Duonebs q6h - Inhaled Flovent BID  - Empiric abx, as below  ???  CV:  #Hypovolemic shock in setting of sedation, improving  Patient requires significant sedation d/t air leak on cuff of ETT, patient changing position can increase air leak. Hypotension requiring pressors in setting of sedation but elevated lactate and signs of end-organ damage including rising Cr. Patient has leukocytosis and on empiric abx, no other signs of infection.  - A-line for accurate BP monitoring  - Pressors: cont phenylephrine, levo, and vaso gtts to allow room for CRRT clearance  - cont hydrocortisone 50mg  q6h  - cont florinef 0.05mg  qday  - S/p hydrocortisone and fludrocortisone (1/2-1/7)  - Trend lactate  - Goal MAPs > 65 on A-line    #Pericardial effusion: Small, stable    #HFpEF  Home dose lasix PO 40mg  BID. TTE shows hyperdynamic EF >75% and elevated PASP to .  - D/c albumin  - cont CRRT as below, goal neg 2-3L  - BID lytes, daily weights, strict I&O's     #Afib  Rate-controlled. CHADS2VaSC 3-4  - Hold home carvedilol 25 mg BID  - Hold home apixaban 5 mg BID  - hep gtt as above    RENAL:  #Acute renal failure:  - cont CRRT, goal neg 2-3L    NEURO:  #Intubated and sedated 2/2 hypoxemic respiratory failure  - Cont sedation with fentanyl/precedex/propofol  - Attempt sedation holiday as tolerated  - Repeat triglycerides q72h while on propofol    ID:  #Sepsis  Afebrile. Increased sputum production x 2 months otherwise no localizing signs of infection. Did receive course of methylprednisolone 12/18-12/23 on recent hospitalization. 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 +Mycobacteria 12/13 BAL resulted on 03/01/17, not nocardia as previously thought. On night of 1/10, febrile and tachycardic, and elevated procal 2.01.   - ID signed off, appreciate recs  - S/p IV Vanc  - S/p Azithromycin  - F/u urine histoplasma Ag (sendout lab), hold antifungal tx at this time  - S/p IV Zosyn - S/p IV Bactrim 500mg  q8hr given that patient does not have pulmonary nocardia  - cont vancomycin (1/9-) for MRSA coverage  - cont mero (1/11-) for PsA coverage  - cont Caspofungin 50mg  qday, s/p 70mg  loading dose   - s/p amikacin 10mg /kg x1 dose  - S/p flucon 400mg  qday (1/11-1/13) for empiric fungemic coverage  - S/p levofloxacin (1/2- 1/11)  - S/p IV cefepime q12h for GP coverage (1/6-1/10)  - S/p azithro (1/10)  - F/u BCx and fungal Cx from 1/10    CHRONIC  #HTN  - Hold home BP meds in setting of shock: nifedipine 30mg  daily, losartan 100 mg daily, carvedilol 25 mg BID  ???  #Chronic???pain   Localized to lower back and lower extremities  - Hold home Norco 5-325 q6h PRN while sedated  ???  #GERD chronic/stable  - Pantoprazole PO 40 mg daily 12/31  ???  #Depression/Anxiety  Mood stable, no SI, no anxiety  - Hold home buspirone 15 mg BID, duloxetine DR 60 while sedated  ???  #PAD  s/p femoral-femoral graft 2009.  - [place] home clopidogrel 75 mg daily  ???  #  HLD chronic/stable  - Hold home Pravastatin 40 mg qhs  ???  #DM2 with associated diabetic peripheral neuropathy  - cont insulin gtt today  - Hold home gabapentin 800 mg TID   ???  F - cont TFs for severe malnutrition. q6h refeeding labs, supp K/Mg/Phos PRN  A - Sedated  S - Fent/precedex/propofol  T -  Hep gtt  H - 30 degrees  U - PPI BID  G - ISS#3, Accuchecks q4h  B - Senna qhs, suppository, miralax  I - ETT 8.45mm (upsided 1/1), PIV 22g R hand (12/28), CVC 3-Lumen L fem (1/1), A-line (12/31), OGT (12/31), PIV x 1 L hand    Disposition: ICU level care required for hypoxemic respiratory failure requiring intubation  ???  Code Status: Now DNR  Contact:  Primary Emergency Contact: Rhinesmith,Johnnie, Home Phone: [phone]    DPOA is daughter Sherwin Hollingshed daughter [phone]    DWA Dr. Dot Been Refugio PGY1    Critical Care Attending Addendum:  ???  I have seen and examined and personally managed this patient who remains critically ill and high risk for clinical deterioration related to acute hypoxic/hypercapneic resp failure in setting of underlying COPD, multifocal PNA, septic shock (not present on admission), acute renal failure with volume overload, and new dx of small cell lung CA with concurrent malignant effusion and pericardial effusion.  ???  Remains very hypoxic on near max vent settings with worsening hypercapnia despite vent changes. Also with worsening shock and now on 3 pressors. Continuing broad antimicrobial coverage and stress dose steroids and anticoagulation for afib. Prognosis guarded and updated family again today after family meeting yesterday. Also got palliative care on board and family ultimately decided to transition to comfort measures only with terminal extubation. Proceeding with this today and also provided family with our 3 wishes program.   ???  Critical Care Time Personally Spent in Patient Care: 67???minutes.  ???  Derica Leiber S. Regino Schultze

## 2017-03-13 NOTE — Other
Patients Clinical Goal:   Clinical Goal(s) for the Shift: 1.) Maintain hemodynamic stability, keep MAP > 65. 2.) Maintain respiratory stability, SpO2 > 88 on ARDS net. 3.) Heparin gtt per protocol. 4.) Insulin gtt per protocol, tolerate TF. 5.) CRRT I/O goal -2 to -3 liter. 6.) Safet and comfort.   Identify possible barriers to advancing the care plan:   Stability of the patient: Unstable - high likelihood or risk of patient condition declining or worsening; patient condition declining or worsening        1.) Pressor requirements increasing to keep MAP > 65. Neo maxed out at 200 mcg/min and Levo gtt restarted, now at 0.2 mvg/kg/min. Vasopressin gtt remains at 2.4. BP unstable especially with repositioning and movement.     2.) ARDS net protocol, remains on very high vent settings to keep SpO2 > 88.    3.) Heparin gtt per protocol needing increase in dose to maintain aPTT within goal. Platelet count trending down, will continue to monitor.     4.) Hyperglycemic, insulin gtt per protocol, now at algorithm # 4    5.) Currently net I/O -1.8L, close to goal but UF decreased in the setting of hypotension with increased pressor requirements.     6.) Remains on multiple sedating and analgesic agents to keep comfortable and synching with MV. Maximized safety and comfort at all times.    - family meeting was held today with hem-onc, MICU, patient's wife Dayton Scrape and daughter Liz Beach who is the DPOA. Decision was made to make patient DNR. All concerns addressed, emotional support provided to family as appropriate.       Ethics Protocol   Ethics Risk Conflict Score: HIGH risk for ethical confict - is likely or very likely to develop under these conditions (more than 6 risk factors checked or ANY *critical risk factor checked)   Reason/Rationale (Why?): Potential escalation of non-beneficial treatment, poor/unclear prognosis, moral distress in family/clinicians   Additional Info (specify) (Examples: Review the Goals of Care/Conference Note on (?), Ethics/Palliative Consult on (?); Family Conference set up with Attending/Team & primary decision makers on (?); important to be consistent and set limits with (?) about (?); Nurse, adult in place; and etc.)          t Summary:

## 2017-03-13 NOTE — Progress Notes
Pharmaceutical Services ??? Vancomycin Dosing (Ongoing)    Patient Name: Ryan Shepard  MRN: [id number]  Age: 77 y.o.  Sex: male    Vancomycin per Pharmacy Consult Order     Start     Ordered    03/07/17 1927  vancomycin per pharmacy  Per Protocol     Question Answer Comment   Indication Pneumonia    Goal Trough Serum Level 15-20 mcg/mL    Has patient received a dose of this drug within the past 72 hours? No        03/07/17 1927        Vital Signs/Other Objective Data  Allergies: Patient has no known allergies.    BP 108/66 (BP Location: Left arm, Patient Position: Lying)  ~ Pulse 108  ~ Temp 35.5 ???C (95.9 ???F)  ~ Resp (!) 32  ~ Ht 1.854 m (6' 0.99'')  ~ Wt (!) 115.6 kg (254 lb 13.6 oz)  ~ SpO2 95%  ~ BMI 33.63 kg/m???     Intake/Output Summary (Last 24 hours) at 03/13/17 1235  Last data filed at 03/13/17 1100   Gross per 24 hour   Intake          4433.52 ml   Output             6062 ml   Net         -1628.48 ml       Medications  Med Administrations and Associated Flowsheet Values (last 72 hours)  Vancomycin administration    Date/Time Action Medication Dose Rate    03/12/17 2252 New Bag/ Syringe/ Cartridge    vancomycin 750 mg in dextrose 5% 150 mL IVPB RTU 750 mg 150 mL/hr    03/12/17 1103 New Bag/ Syringe/ Cartridge    vancomycin 750 mg in dextrose 5% 150 mL IVPB RTU 750 mg 150 mL/hr    03/11/17 2314 New Bag/ Syringe/ Cartridge    vancomycin 750 mg in dextrose 5% 150 mL IVPB RTU 750 mg 150 mL/hr    03/11/17 1110 New Bag/ Syringe/ Cartridge    vancomycin 750 mg in dextrose 5% 150 mL IVPB RTU 750 mg 150 mL/hr    03/10/17 2140 New Bag/ Syringe/ Cartridge    vancomycin 1 g in dextrose 5% 200 mL IVPB RTU 1 g 200 mL/hr        Labs (most recent)  White Blood Cell Count   Date Value Ref Range Status   03/13/2017 3.55 (L) 4.16 - 9.95 x10E3/uL Final   01/25/2008 7.82 3.28 - 9.29 x10E3/uL      Urea Nitrogen   Date Value Ref Range Status   03/13/2017 19 7 - 22 mg/dL Final   53/66/4403 33 (H) 7 - 23 mg/dL Final     Creatinine Date Value Ref Range Status   03/13/2017 0.89 0.60 - 1.30 mg/dL Final   47/42/5956 0.9 0.5 - 1.3 mg/dL      Vancomycin,trough (mcg/mL)   Date/Time Value   03/13/2017 1052 14.2     Vancomycin,random (mcg/mL)   Date/Time Value   03/11/2017 0841 14.0       Assessment  Indication Pneumonia  Goal trough level 15-20 mcg/mL   Measured trough level: 14.2 mcg/ml  This patient is receiving dialysis: Yes, CRRT at 12 L/hr (max rate)    Plan  Ryan Shepard is a 77 y.o. male who has been referred to pharmacy for vancomycin dosing. The indication for vancomycin is Pneumonia. Based on the measured vancomycin level,  the dose should be maintained.      Continue vancomycin 750 mg IV q12h    Pharmacy will continue to monitor the patient's clinical progress. Patient is currently receiving CRRT at a max rate; however, the rate may change based on the patient's clinical status. The next vancomycin trough is scheduled for 1/16 at 1045.    Ellamae Sia, PharmD, 03/13/2017, 12:35 PM

## 2017-03-13 NOTE — Consults
Family meeting was held yesterday and the patient is now DNR but there is no withdrawing of care.

## 2017-03-13 NOTE — Progress Notes
INPATIENT RENAL CONSULTATION    Date of Service: 03/13/2017  Patient Name: Ryan Shepard  MRN: [id number]    Referring Physician: Suzzette Righter., MD    Consult Attending Physician:  Bobbye Charleston    Consult Fellow: Renie Ora. Lonna Cobb, MD    Reason for Consultation:  Management of AKI and hemodialysis.    History of Present Illness:  77 y.o. 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 Patient Started on palliative chemotherapy with carbo/etoposide 03/05/17. However, pt developed AKI. Baseline Creat is around 1.0 but it was peaked to 3.3 on the day of consult in 1/12/209.     Interval event:   1/13: clotted filter once. I/O 4202/428 ml   1/14: Tolerating CRRT. GOC discussion today with primary team.   1/15 on max rate of Nexstge, and bicarb drip     Past Medical History:   Diagnosis Date   ??? Cancer (HCC/RAF)    ??? Diabetes (HCC/RAF)    ??? GERD (gastroesophageal reflux disease)    ??? Hypercholesteremia    ??? Hypertension    ??? PAD (peripheral artery disease) (HCC/RAF)    ??? 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 Prostate disease    ??? Scoliosis     adolesent   ??? Vascular disease        Past Surgical History:   Procedure Laterality Date   ??? BACK SURGERY     ??? BILATERAL FEMORAL ARTERY EXPLORATION  2009   ??? BLADDER SURGERY  1999   ??? PROSTATE SURGERY  2000       No Known Allergies    Current Facility-Administered Medications   Medication Dose Route Frequency   ??? acetaminophen tab 650 mg  650 mg Oral Q6H PRN    Or   ??? acetaminophen 650 mg supp 650 mg  650 mg Rectal Q6H PRN   ??? diphenhydrAMINE cap 25 mg  25 mg Oral Q4H PRN    Or   ??? diphenhydrAMINE 50 mg/mL inj 25 mg  25 mg IV Push Q4H PRN    Or ??? diphenhydrAMINE 50 mg/mL inj 25 mg  25 mg Subcutaneous Q4H PRN   ??? fentaNYL (PF) 2,500 mcg in sodium chloride 0.9% 100 mL drip  50 mcg/hr Intravenous Continuous   ??? filgrastim-sndz 480 mcg/0.8 mL inj 480 mcg  480 mcg Subcutaneous Daily   ??? glycopyrrolate 0.2 mg/mL inj 0.4 mg  0.4 mg IV Push Q4H PRN    Or   ??? glycopyrrolate 0.2 mg/mL inj 0.4 mg  0.4 mg Subcutaneous Q4H PRN   ??? LORazepam tab 1 mg  1 mg Oral Q1H PRN    Or   ??? LORazepam 2 mg/mL inj 1 mg  1 mg IV Push Q1H PRN    Or   ??? LORazepam 2 mg/mL inj 1 mg  1 mg Subcutaneous Q1H PRN   ??? LORazepam 2 mg/mL inj 4 mg  4 mg IV Push Q5 min PRN    Or   ??? LORazepam 2 mg/mL inj 4 mg  4 mg Subcutaneous Q5 min PRN   ??? polyvinyl alcohol 1.4% oph soln 2 drop  2 drop Both Eyes Q2H PRN   ???  propofol 10 mg/mL drip  75 mcg/kg/min (Dosing Weight) Intravenous Continuous   ??? sodium chloride 0.9% IV soln  5-10 mL/hr Intravenous Continuous   ??? sodium chloride 0.9% IV soln  5-10 mL/hr Intravenous Continuous   ??? sodium chloride 0.9% IV soln  5-10 mL/hr Intravenous Continuous       Social History     Social History   ??? Marital status: Married     Spouse name: N/A   ??? Number of children: N/A   ??? Years of education: N/A     Occupational History   ??? Not on file.     Social History Main Topics   ??? Smoking status: Never Smoker   ??? Smokeless tobacco: Never Used   ??? Alcohol use 0.0 oz/week   ??? Drug use: No   ??? Sexual activity: Not on file     Other Topics Concern   ??? Not on file     Social History Narrative   ??? No narrative on file       Family History   Problem Relation Age of Onset   ??? Cancer Mother    ??? Cancer Father    ??? Malignant hyperthermia Neg Hx        Physical Examination:  BP 108/66 (BP Location: Left arm, Patient Position: Lying)  ~ Pulse 101  ~ Temp 34.8 ???C (94.6 ???F) (Axillary)  ~ Resp 14  ~ Ht 1.854 m (6' 0.99'')  ~ Wt (!) 115.6 kg (254 lb 13.6 oz)  ~ SpO2 (!) 71%  ~ BMI 33.63 kg/m???   I/O last 3 completed shifts:  In: 7686.4 [I.V.:4311.4; AVWUJ:811; BJ/YN:8295; IV Piggyback:1390] Out: 62130 [Other:12645; Stool:250]    General orally intubated and sedated lying on the bed   Chest; CTAB  Ext; 1+ edema   Dialysis Access: right femoral HD line       Labs:    Lab Results   Component Value Date    WBC 3.55 (L) 03/13/2017    HGB 7.8 (L) 03/13/2017    HCT 25.4 (L) 03/13/2017    MCV 92.4 03/13/2017    PLT 69 (L) 03/13/2017       Lab Results   Component Value Date    NA 137 03/13/2017    K 4.7 03/13/2017    CL 101 03/13/2017    CO2 21 03/13/2017    BUN 19 03/13/2017    CREAT 0.89 03/13/2017    GLUCOSE 136 (H) 03/13/2017       Lab Results   Component Value Date    CALCIUM 8.4 (L) 03/13/2017    ICALCOR  03/13/2017      Comment:        Test not performed  Unable to calculate, pH is 7.16    PHOS 2.4 03/13/2017    MG 2.1 (H) 03/13/2017    ALBUMIN 4.4 03/13/2017       Lab Results   Component Value Date    PHART 7.19 (LL) 03/13/2017    PCO2ART 64 (HH) 03/13/2017    PO2ART 87 (L) 03/13/2017    BICARBART 23.7 03/13/2017    BEART -4 (L) 03/13/2017    O2SATART 95.0 03/13/2017    FIO2ART 90 03/13/2017       Lab Results   Component Value Date    PHVEN 7.31 02/26/2017    PCO2VEN 60 02/26/2017    PO2VEN 48 02/26/2017    BICARBVEN 29.5 02/26/2017    BEVEN 2 02/26/2017       Imaging:  02/27/17 renal u/s  FINDINGS:  ???  Right kidney:  Length = 11.9 cm  Cortical thickness: Normal  Echogenicity: Normal  Collecting system: No hydronephrosis  Other findings: None  ???  Left kidney:  Length = 9.7 cm  Cortical thickness: Normal  Echogenicity: Normal  Collecting system: No hydronephrosis  Other findings: None  ???  Urinary bladder: Decompressed with a Foley catheter  ???  IMPRESSION:  ???  Normal renal ultrasound. No hydronephrosis.    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 Patient Started on palliative chemotherapy with carbo/etoposide 03/05/17. However, pt developed AKI. Baseline Creat is around 1.0 but it was peaked to 3.3 on the day of consult in 1/12/209.     # AKI could be due to multifactorial carboplatin induced or Tumor lysis syndrome Harmon Pier Bishop Score: positive Phos, urina acid increase and AKI  ) or sepsis. Given patient requires vasopressor and becoming anuric, will start CRRT.    # Hypoxemic respiratory failure now intubated, on ARDS net protocol with permissive hypercapnia.   # Metastatic small cell carcinoma   # COPD   # HFpEF  # Afib  # HTN  # DM2  # PAD s/p femoral graft  # Prior prostate CA    Recommendations:    1. Continue Nxstage at 12 liters/hr in setting of severe acidosis and ARDS     2. Check BMP, mag, and phos daily  3. Low Na, Low K, Low phos, 1L fluid restricted diet  4. Strict I&O and daily weights    -renally dose meds for GFR 20-30 while on CRRT  -avoid gadolinium exposure with MRI.      Poor prognosis, d/w family at bed side     Discussed with attending physician.  Please call [phone] with any questions.  Will continue to follow.    Shakeeta Godette C. Lonna Cobb, MD 03/13/2017 2:48 PM  Nephrology Fellow

## 2017-03-13 NOTE — Nursing Note
1900 Report received   2000 Assessments done. Teaching is done. Medications given. Labs sent.   2100 lab results reviewed paged to [place] and RT.  2200 labs sent.  2230 lab results reviewed and paged to Md and RT  2300 Renal paged increased rate of CRRT to 10L/hr, vent adjustments of settings are done.   0000 Assessments done. Teaching is done. meds given. Labs sent. CRRT rate increased to 12L/hr. Family notified.  0100 lab results reviewed paged to Md and RT.  0200 Moved up on insuline gtt algorithm per protocol. Family arrived and updated.  0400 Assessments done. Teaching is done. Labs sent.  Medications given.  0500 lab results reviewed and paged to RT and Md.  0545 change the vent settings per ARDS NET. Heparin gtt paused per protocol.  0600 Updated Md. meds given.

## 2017-03-13 NOTE — Goals of Care
Goals of Care   A Goals of Care note documents discussions and decisions about future medical treatments including conversations to enhance prognostic awareness, discussions leading to or failing to yield a completed advance directive, patient preferences concerning future health states and treatments, conversations leading to a POLST, and nuanced discussions about future care goals or health states.    Our team with with the patient's family (daughter, wife) at bedside today. They shared with [place] that this has been a rapid and sudden decline for the patient and that they were aware that he is very ill. They shared with [place] that the patient had previously stated that he would want as much done as possible to give him the best shot at recovery and that they have been using this prior discussion they've had to guide their decisions.   Our team shared with the patient that given the patient's decline and extent of disease and life support requirements, and based on our team's own discussion with the ICU team, that the patient's prognosis could be measured in hours to days. The patient's family was understandably surprised and emotional at being given this news.   We talked about the different options for the patient and discussed that with continued aggressive care the patient could have a few more days. We also discussed what care given with a priority on comfort would look like (e.g. Giving medications for comfort, discontinuing IV medications for comfort purposes, palliative extubation, etc.).   The family initially stated that they wanted to think about this decision. Our team was later called to speak to the family. On arrival, the family stated that they wished to proceed with palliative extubation and comfort care. Emotional support was provided to the family.    Plan:  - Plan for palliative extubation, discontinuation of CRRT/IV medications.   - Our team will continue to follow.

## 2017-03-14 DIAGNOSIS — Z515 Encounter for palliative care: Secondary | ICD-10-CM

## 2017-03-14 DIAGNOSIS — R0600 Dyspnea, unspecified: Secondary | ICD-10-CM

## 2017-03-14 LAB — Blood Culture Detection
BLOOD CULTURE FINAL STATUS: NEGATIVE
BLOOD CULTURE FINAL STATUS: NEGATIVE

## 2017-03-14 LAB — Legionella Culture: LEGIONELLA CULTURE: NEGATIVE

## 2017-03-14 MED ADMIN — VASOPRESSIN 20 UNITS, 40 UNITS/100 ML DRIP: 2.4 [IU]/h | INTRAVENOUS | @ 01:00:00 | Stop: 2017-03-14 | NDC 42023016425

## 2017-03-14 MED ADMIN — GLYCOPYRROLATE 0.2 MG/ML IJ SOLN: .4 mg | INTRAVENOUS | @ 03:00:00 | Stop: 2017-03-14 | NDC 70121139405

## 2017-03-14 MED ADMIN — PROPOFOL INFUSION 10 MG/ML: 7755 ug/min | INTRAVENOUS | @ 01:00:00 | Stop: 2017-03-14 | NDC 63323026965

## 2017-03-14 MED ADMIN — PROPOFOL INFUSION 10 MG/ML: 7755 ug/min | INTRAVENOUS | @ 03:00:00 | Stop: 2017-03-14 | NDC 63323026965

## 2017-03-14 MED ADMIN — LORAZEPAM 2 MG/ML IJ SOLN: 2 mg | INTRAVENOUS | @ 03:00:00 | Stop: 2017-03-14 | NDC 00641604425

## 2017-03-14 MED ADMIN — LORAZEPAM 2 MG/ML IJ SOLN: 4 mg | INTRAVENOUS | @ 04:00:00 | Stop: 2017-03-14 | NDC 00641604425

## 2017-03-14 NOTE — Other
Patients Clinical Goal:   Clinical Goal(s) for the Shift: comfort care, three wishes, provide information to pt family.   Identify possible barriers to advancing the care plan:   Stability of the patient: Unstable - high likelihood or risk of patient condition declining or worsening; patient condition declining or worsening    End of Shift Summary: pt was extubated at [place], pt expired at 20:14 comfortably, pt family were provided with all information, one legacy notified ref [id number] for possible harvesting per rep [place].

## 2017-03-14 NOTE — Consults
SPRITUAL CARE CONSULTATION NOTE    PATIENT:  Ryan Shepard  MRN:  [id number]     Patient Info        Religious/Spiritual Identity:        [name]       Last Anointed Date:                 Baptised:                 Spiritual Care Visit Details              Date of Visit:  03/13/17  Time of Visit:  1615  Visited with Patient, Family Theme park manager) (Pt's wife.)   Visit length 30 Minutes   Referral source Interdisciplinary Rounds   Reason for visit Initial visit/assessment      Spiritual Assessment     Spiritual practices & resources Unable to assess (Specify reason) (Pt unable to communicate.)   Areas of spiritual/emotional distress Unable to assess (Specify reason) (Pt unable to communicate.)   Distressful feelings     Indicators of spiritual wellbeing Unable to assess (Specify reason) (Pt unable to communicate.)   Expressions of spiritual wellbeing        Plan     Spiritual care intervention Active Listening, Addressed emotional concerns/distress, Building trust, Explored feelings related to present illness, Explored feelings about God/the Transcendent, Pastoral Conversation, Prayer, Sacramentals provided (Interventions given to pt's spouse.)   Outcomes (per patient/family) Appreciated visit (Per pt's wife.)   Spiritual care plans Continue to visit as needed   Additional comments None      Recommendation            Author:  Luvenia Redden [facility] 03/13/2017 5:00 PM  Contact info: RR pager: [phone] ext: [phone]

## 2017-03-14 NOTE — Discharge Summary
MICU Discharge Summary    Patient: Ryan Shepard  MRN: [id number]  DOB: [birth year]  Date of Service: 03/11/2017    Primary Care Provider: Sheppard Coil, MD            [address] / [place] [address]          T: [phone]          F: [phone]     ADMIT DATE: 2017-03-09    DISCHARGE DATE: 03/13/2017    HOSPITAL TEAM: MICU  Attending Physician: Suzzette Righter., MD  Resident Physician: Ilene Qua, MD  Intern Physician: Gevena Cotton, MD    ADMITTING DIAGNOSES (CHRONIC PROBLEMS):     Past Medical History:   Diagnosis Date   ??? Cancer (HCC/RAF)    ??? Diabetes (HCC/RAF)    ??? GERD (gastroesophageal reflux disease)    ??? Hypercholesteremia    ??? Hypertension    ??? PAD (peripheral artery disease) (HCC/RAF)    ??? 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 Prostate disease    ??? Scoliosis     adolesent   ??? Vascular disease        DISCHARGE DIAGNOSES:     Patient Active Problem List   Diagnosis   ??? Lower back pain   ??? 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 OA (osteoarthritis) of knee   ??? Left knee pain   ??? Left shoulder pain   ??? Primary osteoarthritis of knees, bilateral   ??? COPD exacerbation (HCC/RAF)   ??? COPD (chronic obstructive pulmonary disease) (HCC/RAF)   ??? Chronic prescription opiate use   ??? 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 PAD (peripheral artery disease) (HCC/RAF)   ??? Hypertension, essential with worsening due to steroid use   ??? Hypercholesteremia   ??? GERD (gastroesophageal reflux disease)   ??? Lower extremity edema   ??? Memory loss   ??? Acute and chronic respiratory failure with hypoxia (HCC/RAF)   ??? Chronic diastolic heart failure of unknown etiology (HCC/RAF)   ??? Liver masses, hypervascular with history of prostate cancer   ??? AF (paroxysmal atrial fibrillation) (HCC/RAF)   ??? Dyspnea   ??? Acute hypoxemic respiratory failure (HCC/RAF)   ??? Small cell carcinoma of lung (HCC/RAF)       HISTORY OF PRESENT ILLNESS: Ryan Shepard???is a 77 y.o.???male???with a history of COPD (on 3LNC), HFpEF, afib, HTN, DM2, PAD s/p femoral graft, prior prostate CA who was admitted to the ICU for hypoxemic respiratory failure requiring BiPAP.  ???  Patient was recently hospitalized and discharged 12/26 for similar issue requiring aggressive bronchodilation and bronchoscopy and was also treated for COPD exacerbation.  ???  Patient was admitted to ED this AM directly from clinic d/t incr respiratory distress and sats in low 70's on home 4L required. On admission to ED vitals were notable for sats in low 80s on home O2 requirement. Patient's BP was noted to be lower in R arm (SBP 90) compared to left arm (SBPs 110-120s), however patient has no CP. CXR was stable from prior and showed unchanged mediastinal adenopathy and left hilar mass extending into upper lobe and lower lobe with left lymphangitic carcinomatosis, unchanged moderate left pleural effusion. Patient was placed on BiPAP titrated up to FiO2 60%, 12/5 and ABG was 7.4/45/72/27. Patient was given duonebs x 1, empiric doses of vanc and zosyn, 20mg  IV lasix and  transferred to ICU for further management.  ???  In ICU, patient was weaned to HFNC 60/60 from BiPAP.  ???  On further history, patient reports feeling mildly SOB but otherwise feels well. Reports increased sputum x 2 months occasionally scant amounts of brown, worsening over past month. Patient states that he felt back to his baseline when discharged home from hospital on 12/26 (home O2 requirement of 4L). He went to clinic 12/28 AM for routine follow-up where he was noted to have desats but he reports feeling ''fine.'' To his knowledge has taken all of his home medications including lasix, however he was only home for 1 day from discharge and his wife helps him with medications. Former smoker and but has not smoked in over 25 years.  Does endorse mild right lower leg pain but denies swelling. No h/o clots, is on [facility] for afib. Denies chest pain, F/chills, weight loss/gain, N/V, abd pain, recent travel, orthopnea, hemoptysis.  ???  Interval Events:  -Put back on BiPAP since 4am from HFNC ABG 7.4/43/65, bicarb 25.  -On BiPAP 60%/70, 10/5 (set RR 8 but breathing 13-18). AM ABG 7.42/42/69, bicarb 26.5 on   -AM CXR more fluid overload, given lasix  -Afebrile (tmax 36.9), HR 80s, BP Maps 70-80 with SBP 100/60s, sats 88-92%  -I/O, negative 300  ???  Subjective:  -Appears uncomfortable, retractions    HOSPITAL COURSE:   Patient was admitted from clinic in acute hypoxic respiratory failure requiring BiPAP. He had recently been admitted at [facility] for pneumonia. Liver lesions suspicious for metastatic disease were incidentally found on CT imaging during that admission. Patient was intubated several days into admission for worsening respiratory failure. He required pressors for shock in the setting of high-dose propofol. He was treated with various broad-spectrum antibiotic regimens for sepsis and presumed pneumonia in the setting of respiratory failure. He underwent IR-guided biopsy of one of his liver lesions, which was consistent with small cell carcinoma, thought to be of lung origin given lung findings on imaging and significant smoking history. Heme/onc was consulted and recommended palliative chemotherapy. Patient began cycle 1 of carboplatin/etoposide without improvement in respiratory status. He went into renal failure and CRRT was initiated. Palliative care was consulted. Both oxygenation and ventilation became increasingly difficult, and the family decided to withdraw care. The patient was terminally extubated and passed away peacefully shortly thereafter.    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                                 BP:               113/67  Diagnosis:  ICD-10-CM: J96.21 Indication: acute resp failure Referring Provider: Jess Barters Sonographer 1:      Nena Jordan  Report prepared/completed by [id number] Gilmer Mor MD on 02/27/2017 at 2:28:09 PM Other Study Information: Good image quality. Technically challenging study                          related to the patients body habitus. Patient is                          intubated and on ventilator.  --------------------------------------------------------------------------  2D AND M-MODE MEASUREMENTS (normal ranges within parentheses): Left Ventricle:         Aorta/Left Atrium: IVSd (2D):      1.5 cm  Aortic Root, d (2D): 3.4 cm LVPWd (2D):     1.3 cm  Right Ventricle: LVIDd (2D):     3.8 cm  RV basal minor, A4C 3.5 cm LV SYSTOLIC FUNCTION BY 2D PLANIMETRY (MOD): WMSI 1.0  LV DIASTOLIC FUNCTION: MV Peak E: 39 cm/s MV Peak A: 74 cm/s e'          6.0 cm/s E/A Ratio: 0.5     E/e' Ratio: 6.4 Right Ventricle: TAPSE:           2.6 cm LA Volumes: LA Vol A4C: 45 ml LA Vol A4C index: 20 ml/m??? LA area: 16.6 cm??? Aortic Valve: AoV Max Vel: 1.29 m/s AoV Peak PG: 7 mmHg AoV Mean PG: Tricuspid Valve and PA/RV Systolic Pressure: TR Max Velocity: 3.6 m/s RA Pressure: 8 mmHg RVSP/PASP: 59 mmHg Pulmonic Valve: PV AT: 85 msec Systemic Veins: IVC Diameter: 2.17 cm -------------------------------------------------------------------------- PHYSICIAN INTERPRETATION: LEFT VENTRICLE: Small left ventricular size. Moderate septal hypertrophy. Hyperdynamic systolic function. Visually estimated left ventricular ejection fraction >75%. Mild LV diastolic dysfunction (grade I, impaired relaxation). LV Wall Motion: All segments are normal. RIGHT VENTRICLE: Normal right ventricular size and normal systolic function TAPSE 2.6 cm, (DTI 27.0 cm/s). LEFT ATRIUM: Normal left atrial size. RIGHT ATRIUM: Right atrial pressure is estimated at 8 mmHg. MITRAL VALVE: Thickened mitral valve leaflets. No mitral annular calcification. Trace mitral valve regurgitation. TRICUSPID VALVE: Mild tricuspid valve regurgitation. The tricuspid regurgitant velocity is 3.58 m/s, and with an assumed right atrial pressure of 8 mmHg, the estimated right ventricular systolic pressure is severely elevated at 59 mmHg. AORTIC VALVE: No significant aortic stenosis. No evidence of aortic regurgitation. PULMONIC VALVE: Trace pulmonic valve regurgitation. SYSTEMIC VEINS: The inferior vena cava is not well visualized. PERICARDIUM: Small localized pericardial effusion near the right ventricle. The pericardial effusion appears to contain mixed echogenic material. There is no significant echocardiographic evidence of cardiac tamponade. Small anterior pericardial effusion, measuring up to 8 mm. No echocardiographic evidence of tamponade.  CONCLUSIONS:  1. Small LV size, moderate septal hypertrophy, normal wall motion and hyperdynamic systolic function.  2. Left ventricular ejection fraction is approximately >75%.  3. Mild LV diastolic dysfunction (grade I, impaired relaxation).  4. Normal right ventricular size and normal systolic function. (TAPSE 2.6 cm, DTI 27.0 cm/s).  5. Mild tricuspid valve regurgitation with estimated pulmonary artery systolic pressure of 51 mm Hg + CVP/JVP (IVC  assessment inaccurate in setting of mechanical ventilation).  6. Small anterior pericardial effusion, measuring up to 8 mm. No echocardiographic evidence of tamponade.  7. Compared to TTE performed 02/01/2017, no significant changes noted with LV and RV function. Pericardial effusion is new. 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  80ml iodixanol (Visipaque) 320 mg/mL inj 100 mL was injected intravenously without complications. The patient received the following exposure event(s) during this study, and the dose reference values for each are as shown (CTDIvol in mGy, DLP in mGy-cm). Note that the values are not patient dose but numbers generated from scan acquisition factors based on 32 cm (body, ''a'') and/or 16 cm (head, ''b'') phantoms and may substantially under-estimate or over-estimate actual patient dose based on patient size and other factors. Brain (58b, 1357);  (); FINDINGS: There is no evidence of acute intracranial mass effect, hemorrhage, hydrocephalus, or shift of the septum pellucidum. No abnormal enhancement is present. The ventricles and sulci are normal in size. No acute displaced fractures are seen. The paranasal sinuses and mastoid air cells are clear.     IMPRESSION: No acute intracranial hemorrhage or mass effect. No abscess is seen. Signed by: Angelique Holm   03/01/2017 10:26 PM    Ct Chest W Contrast    Result Date: 02/16/2017  CT CHEST W CONTRAST CLINICAL HISTORY: Follow-up intrathoracic LAD, history of prostate cancer. COMPARISON: CT dated February 05, 2017. TECHNIQUE: On a SIEM[employer] Sensation 64 CT scanner, a volumetric CT of the chest was performed following the uneventful administration of contrast. Multiplanar reformations were performed. CONTRAST: iohexol (Omnipaque) 350 mg/mL inj 125 mL. DOSE ESTIMATE: The patient received the following exposure event(s) during this study, and the dose reference values for each are as shown (CTDIvol in mGy, DLP in mGy-cm). Note that the values are not patient dose but numbers generated from scan acquisition factors based on 32 cm (body, ''a'') and/or 16 cm (head, ''b'') phantoms and may substantially under-estimate or over-estimate actual patient dose based on patient size and other factors. Chest wc (21a, 795); Abd/Pel (19a, 1062);  ();  (); The following accession numbers are related to this dose report [id number]: [id number]; FINDINGS: Lungs: Secretions visualized along the tracheal air columns. Moderate centrilobular emphysema. Redemonstration of coalescent airspace and nodular consolidation, most prominent in the left upper lobe and lingula. Lymph nodes: Unchanged to slightly more prominent mediastinal and hilar lymphadenopathy, more on the left side of the mediastinum and hilum. Lymph node completely encasing left common carotid and partially encasing right innominate and left subclavian arteries. Pleura: Interval increased size of moderate left pleural effusion. Multiple pleural nodules measuring 6 mm in posterior pleura (4-27), 8 mm attached to aortic arch (4-41), and 5 mm within posterior space (4-74). Cardiovascular: Normal heart size. Interval increased size of small pericardial effusion. Dilated main pulmonary artery measuring 39 mm. Severe coronary artery atherosclerotic calcifications. Moderate aortic valve/leaflet atherosclerotic calcifications. Moderate atherosclerotic calcifications of aorta. Chest wall: Diffuse anasarca. Osseus: Heterogeneity of osseous structures including cervical spine and sternum, nonspecific.  Mild multilevel degenerative disc disease of the thoracic and lumbar lumbar spine.  Upper abdomen: See separate report of CT abdomen/pelvis.. IMPRESSION: 1.   Redemonstration of coalescent airspace and nodular consolidation, most prominent in the left upper lobe and lingula with peripheral groundglass associated with Unchanged to slightly more prominent mediastinal and bilateral hilar lymphadenopathy. This finding is nonspecific but concerning for malignancy versus pneumonia with etiologies including fungal infection. 2.  Increase in size of pleural and pericardial effusions and anasarca consistent with volume overload. 3.   Dilated main pulmonary artery measuring 39 mm reflecting pulmonary arterial hypertension. 4.  Severe coronary artery calcifications. Dictated by: Carolina Cellar   02/16/2017 12:07 PM Signed by: Floy Sabina   02/16/2017 12:30 PM    Ct Abd+pelvis W Contrast    Result Date: 02/16/2017  CT ABD+PELVIS W CONTRAST CLINICAL HISTORY: eval for intraabdominal processes and LAD in s/o new mediastinal LAD of uncertain etiology, hx of prostate CA. COMPARISON: None. TECHNIQUE: On a multirow-detector CT scanner, a volumetric contrast enhanced scan was performed through the abdomen and pelvis. CONTRAST: iohexol (Omnipaque) 350 mg/mL inj 125 mL. EXAM DOSAGE: The patient received the following exposure event(s) during this study, and the dose reference values for each are as shown (CTDIvol in mGy, DLP in mGy-cm). Note that the values are not patient dose but numbers generated from scan acquisition factors based on 32 cm (body, ''a'') and/or 16 cm (head, ''b'') phantoms and may substantially under-estimate or over-estimate actual patient dose based on patient size and other factors. Chest wc (21a, 795); Abd/Pel (19a, 1062);  ();  (); The following accession numbers are related to this dose report [id number]: [id number]; FINDINGS: Lung bases: See separate chest CT report of the same day. Liver: At least 4 hypervascular masses within the right hepatic lobe, the largest in segment 8 (10-25) measuring 5.0 x 3.6 cm. A hepatic segment 6/7 mass (10-42) is approximately 3.0 x 2.9 cm.. Gallbladder and bile ducts: Unremarkable. Spleen: Unremarkable. Pancreas: Unremarkable. Adrenals: Unremarkable. Kidneys and ureters: Bilateral renal cortical scarring. No hydronephrosis. Bowel: Colonic diverticulosis without associated colonic wall thickening or surrounding stranding.. Bladder: Unremarkable. Reproductive organs: Postsurgical changes of prostatectomy and bilateral iliac nodal dissections.. Lymph nodes: No lymphadenopathy. Peritoneum: Unremarkable. Vessels: Moderate atherosclerotic calcifications and renal vascular calcifications. Thrombosis of the left common, external, and femoral artery, with patent femoral artery to femoral artery bypass. Multifocal stenosis in the visualized bilateral femoral arteries. Abdominal wall: Mild body wall edema.. Bones: Multilevel degenerative changes of the lumbar spine.     IMPRESSION: History of prostate cancer treated with total prostatectomy with at least 4 hypervascular hepatic masses measuring up to 5 cm in the right hepatic lobe, suspicious for metastatic disease. No abdominal or pelvic lymphadenopathy. Severe atherosclerotic disease with thrombosed left common, external, and femoral artery, and patent femorofemoral bypass. I, Clista Bernhardt, M.D., have reviewed this radiological study personally and I am in full agreement with the findings of the report presented here. Dictated by: Manfred Shirts   02/16/2017 10:49 AM Signed by: Clista Bernhardt   02/16/2017 11:22 AM    Xr Chest Ap Portable (1 View)    Result Date: 03/10/2017  EXAM: XR CHEST AP 1V PORTABLE on   03/10/2017 COMPARISON DATE: 03/09/2017 INDICATION: SOB     IMPRESSION: Endotracheal tube is satisfactory in position. Feeding tube is seen extending beyond GE junction. Right IJ terminates at atriocaval junction. Heart size is normal. Pulmonary vascular congestion, unchanged. Left upper lobe and left lower lobe pulmonary infiltrates and mass, unchanged. Mediastinal widening unchanged. Small right pleural effusion remain stable.  Signed by: Clarene Reamer  Abtin   03/10/2017 12:33 PM    Xr Chest Ap Portable (1 View)    Result Date: 03/09/2017  XR CHEST AP 1V PORTABLE 03/09/2017 4:10 AM COMPARISON:  03/06/2017. CLINICAL INDICATION:  c/f worsening sepsis (underlying COPD, multifocal pneumonia with recent diagnosis of small cell lung carcinoma and malignant effusion     IMPRESSION: Endotracheal enteric tube and right PICC are unchanged. Cardiac silhouette partly obscured, grossly unchanged. There likely is bilateral hilar fullness/adenopathy. Right PICC unchanged.  Persistent masslike central consolidation in the left lung, contiguous with the left hilum, possibly known malignancy with or without superimposed atypical infection. Mixed interstitial, granular and groundglass opacities throughout the rest of the lungs, likely edema. More focal opacity in the right base may represent atelectasis or additional focus of pneumonia. Small bilateral pleural effusions, partly layering on the right. Signed by: Albin Fischer   03/09/2017 10:26 AM    Xr Chest Ap Portable (1 View)    Result Date: 03/06/2017  Single View Chest 03/06/2017 at 1633 hours Clinical History: Chronic obstructive pulmonary and peripheral artery disease, hypertension, atrial fibrillation, hyperlipidemia, diabetes mellitus, gastroesophageal reflux disease, anxiety and depression and chronic pain. Intermittent dyspnea and bilateral lower extremity edema. Sputum with aspergillus. Left thoracentesis. PICC placement. Compare: 03/06/2017 at 1248 hours.     IMPRESSION: ET, NG tube. Right PICC terminating at the atrial caval transition. Unchanged bilateral mediastinal and bilateral hilar fullness, consistent with reactive lymphadenopathy. Unchanged patchy and nodular airspace consolidation involving the central left lung, consistent with atypical pneumonia. Unchanged small bilateral pleural effusions. Signed by: Kae Heller   03/06/2017 7:40 PM    Xr Chest Ap Portable (1 View)    Result Date: 03/06/2017  XR CHEST AP 1V PORTABLE INDICATION: As provided,'' desating unstable patient, c/f ptx'' COMPARISON: Chest radiograph from 04 March 2017     IMPRESSION: Large area of nodular consolidation in the left lung is unchanged. Prominence of the mediastinal/hilar region/adenopathy is unchanged. The cardiac silhouette is normal. There is diffuse interstitial prominence/edema. There is no significant pneumothorax. There is a small right pleural effusion. The left costophrenic angle is incompletely imaged. Signed by: Celso Amy   03/06/2017 1:37 PM    Xr Chest Ap Portable (1 View)    Result Date: 03/04/2017  Single View Chest 03/04/2017 at 0915 hours Clinical History: Chronic obstructive pulmonary and peripheral artery disease, hypertension, atrial fibrillation, hyperlipidemia, diabetes mellitus, gastroesophageal reflux disease, anxiety and depression and chronic pain. Intermittent dyspnea and bilateral lower extremity edema. Sputum with aspergillus. Left thoracentesis. Dyspnea. Compare: 03/03/2017 at 1550 hours.     IMPRESSION: ET, NG tube terminating within the gastric antrum. Unchanged bilateral mediastinal and bilateral hilar fullness, consistent with reactive lymphadenopathy. Unchanged patchy and nodular airspace consolidation involving the central left lung, consistent with atypical pneumonia. Unchanged small left pleural effusion. No pneumothorax. Signed by: Kae Heller   03/04/2017 9:29 AM    Xr Chest Ap Portable (1 View)    Result Date: 03/03/2017  Single View Chest 03/03/2017 at 1550 hours Clinical History: Chronic obstructive pulmonary and peripheral artery disease, hypertension, atrial fibrillation, hyperlipidemia, diabetes mellitus, gastroesophageal reflux disease, anxiety and depression and chronic pain. Intermittent dyspnea and bilateral lower extremity edema. Sputum with aspergillus. Left thoracentesis. Compare: 03/03/2017 at 1209 hours.     IMPRESSION: ET, NG tube terminating within the gastric antrum. Unchanged bilateral mediastinal and bilateral hilar fullness, consistent with reactive lymphadenopathy. Unchanged patchy and nodular airspace consolidation involving the central left lung, consistent with atypical pneumonia. Decreased small left pleural effusion. No  pneumothorax postthoracentesis. Signed by: Kae Heller   03/03/2017 4:16 PM    Xr Chest Ap Portable (1 View)    Result Date: 03/03/2017  Single View Chest 03/03/2017 at 0831 hours Clinical History: Chronic obstructive pulmonary and peripheral artery disease, hypertension, atrial fibrillation, hyperlipidemia, diabetes mellitus, gastroesophageal reflux disease, anxiety and depression and chronic pain. Intermittent dyspnea and bilateral lower extremity edema. Sputum with aspergillus. Compare: 03/02/2017 at 0814 hours.     IMPRESSION: ET, NG tube. Unchanged bilateral mediastinal and bilateral hilar fullness, consistent with reactive lymphadenopathy. Unchanged patchy and nodular airspace consolidation involving the central left lung, consistent with atypical pneumonia. Unchanged small left pleural effusion. Signed by: Kae Heller   03/03/2017 9:57 AM    Xr Chest Ap Portable (1 View)    Result Date: 03/02/2017  EXAM:  XR CHEST AP 1V PORTABLE  03/02/2017 COMPARISON: 03/01/2017 INDICATION:   hypoxia     IMPRESSION: Endotracheal tube terminates approximately 4.5 cm above the carina. Enteric tube coursing the diaphragm out of field of view. Partially obscured cardiomediastinal silhouette. Left lung masslike consolidation is contiguous with hilar and mediastinal lymphadenopathy. Moderate left pleural effusion. Interstitial pulmonary edema and vascular congestion. Unchanged left lung reticulonodular opacity. Right basilar atelectasis. Signed by: Benjamine Sprague   03/02/2017 3:13 PM    Xr Chest Ap Portable (1 View)    Result Date: 03/01/2017 XR CHEST AP 1V PORTABLE, XR CHEST AP 1V PORTABLE, XR CHEST AP 1V PORTABLE INDICATION: As provided,'' hypoxia'' COMPARISON: Chest radiograph from 27 February 2017 at 1547, CT scan of the chest from 27 February 2017     IMPRESSION: 3 January: Endotracheal tube terminates just below the thoracic inlet, enteric tube travels below the diaphragm. Large area of masslike consolidation in the left heart perihilar region is unchanged. There are small bilateral pleural effusions with adjacent basilar atelectasis/consolidation. Cardiac silhouette is unchanged. The mediastinal adenopathy is better imaged on the CT scan. There is pulmonary edema. 2 January: Endotracheal tube terminates just below the thoracic inlet, enteric tube travels below the diaphragm. Large area of masslike consolidation in the left heart perihilar region is unchanged. There are small bilateral pleural effusions with adjacent basilar atelectasis/consolidation. Cardiac silhouette is unchanged. The mediastinal adenopathy is better imaged on the CT scan. There is pulmonary edema. 1 January: Endotracheal tube terminates just below the thoracic inlet, enteric tube travels below the diaphragm. Large area of masslike consolidation in the left heart perihilar region is unchanged. There are small bilateral pleural effusions with adjacent basilar atelectasis/consolidation. Cardiac silhouette is unchanged. The mediastinal adenopathy is better imaged on the CT scan. There is pulmonary edema. Signed by: Celso Amy   03/01/2017 8:59 AM    Xr Chest Ap Portable (1 View)    Result Date: 03/01/2017  XR CHEST AP 1V PORTABLE, XR CHEST AP 1V PORTABLE, XR CHEST AP 1V PORTABLE INDICATION: As provided,'' hypoxia'' COMPARISON: Chest radiograph from 27 February 2017 at 1547, CT scan of the chest from 27 February 2017     IMPRESSION: 3 January: Endotracheal tube terminates just below the thoracic inlet, enteric tube travels below the diaphragm. Large area of masslike consolidation in the left heart perihilar region is unchanged. There are small bilateral pleural effusions with adjacent basilar atelectasis/consolidation. Cardiac silhouette is unchanged. The mediastinal adenopathy is better imaged on the CT scan. There is pulmonary edema. 2 January: Endotracheal tube terminates just below the thoracic inlet, enteric tube travels below the diaphragm. Large area of masslike consolidation in the left heart perihilar region is unchanged. There  are small bilateral pleural effusions with adjacent basilar atelectasis/consolidation. Cardiac silhouette is unchanged. The mediastinal adenopathy is better imaged on the CT scan. There is pulmonary edema. 1 January: Endotracheal tube terminates just below the thoracic inlet, enteric tube travels below the diaphragm. Large area of masslike consolidation in the left heart perihilar region is unchanged. There are small bilateral pleural effusions with adjacent basilar atelectasis/consolidation. Cardiac silhouette is unchanged. The mediastinal adenopathy is better imaged on the CT scan. There is pulmonary edema. Signed by: Celso Amy   03/01/2017 8:59 AM    Xr Chest Ap Portable (1 View)    Result Date: 03/01/2017  XR CHEST AP 1V PORTABLE, XR CHEST AP 1V PORTABLE, XR CHEST AP 1V PORTABLE INDICATION: As provided,'' hypoxia'' COMPARISON: Chest radiograph from 27 February 2017 at 1547, CT scan of the chest from 27 February 2017     IMPRESSION: 3 January: Endotracheal tube terminates just below the thoracic inlet, enteric tube travels below the diaphragm. Large area of masslike consolidation in the left heart perihilar region is unchanged. There are small bilateral pleural effusions with adjacent basilar atelectasis/consolidation. Cardiac silhouette is unchanged. The mediastinal adenopathy is better imaged on the CT scan. There is pulmonary edema. 2 January: Endotracheal tube terminates just below the thoracic inlet, enteric tube travels below the diaphragm. Large area of masslike consolidation in the left heart perihilar region is unchanged. There are small bilateral pleural effusions with adjacent basilar atelectasis/consolidation. Cardiac silhouette is unchanged. The mediastinal adenopathy is better imaged on the CT scan. There is pulmonary edema. 1 January: Endotracheal tube terminates just below the thoracic inlet, enteric tube travels below the diaphragm. Large area of masslike consolidation in the left heart perihilar region is unchanged. There are small bilateral pleural effusions with adjacent basilar atelectasis/consolidation. Cardiac silhouette is unchanged. The mediastinal adenopathy is better imaged on the CT scan. There is pulmonary edema. Signed by: Celso Amy   03/01/2017 8:59 AM    Xr Chest Ap Portable (1 View)    Result Date: 02/28/2017  XR CHEST AP 1V PORTABLE  CLINICAL HISTORY: Fever workup. COMPARISON: Chest radiograph February 27, 2017 at 8:43 AM, CT chest February 27, 2017.     IMPRESSION:  Stable position of endotracheal tube and enteric tube. No significant change in left mediastinal/left upper lobe mass with multi compartmental adenopathy, better visualized on CT chest February 27, 2017. Persistent lingular, left lower lobe and right lung base opacities, likely pneumonia possibly with component of atelectasis. Mass lesion not excluded. The left costophrenic angle is not included in the field-of-view, however left pleural effusion appears slightly decreased. I, Albin Fischer, MD, have reviewed this study personally and I am in full agreement with the findings of the report presented here. Dictated by: Theressa Millard   02/28/2017 9:14 AM Signed by: Albin Fischer   02/28/2017 10:52 AM    Xr Chest Ap Portable (1 View)    Result Date: 02/27/2017  PORTABLE  CHEST RADIOGRAPH COMPARISON: 02/26/2017 CLINICAL INDICATION:  intubated, hypoxia IMPRESSION: Stable endotracheal tube and enteric tube. 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 Grossly unchanged small to moderate left pleural effusion.   Persistent consolidation in the lingula and left lower lobe, likely reflecting combination of atelectasis and concurrent pneumonia. Persistent right basilar atelectasis. No clinically significant pneumothorax. Signed by: Gretta Arab   02/27/2017 11:47 AM    Xr Chest Ap Portable (1 View)    Result Date: 02/27/2017  PORTABLE  CHEST  RADIOGRAPH COMPARISON: 02/26/2017 CLINICAL INDICATION:  intubated with worsening hypoxia     IMPRESSION: Stable endotracheal tube and enteric tube. 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 Persistent small to moderate left pleural effusion.  Increased consolidation in the lingula and left lower lobe, likely reflecting increasing atelectasis with possible concurrent pneumonia; given interval decreased lung volumes. Right basilar atelectasis, increased from prior. No clinically significant pneumothorax. Signed by: Gretta Arab   02/27/2017 9:05 AM    Xr Chest Ap Portable (1 View)    Result Date: 02/26/2017  XR CHEST AP 1V PORTABLE   02/26/2017 1:40 PM COMPARISON: 02/26/2017 at 0824 hours HISTORY: hypoxia     IMPRESSION: Interval intubation, the endotracheal tube tip approximately 5 cm above the carina. Enteric tube courses to the stomach. 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 Moderate left pleural effusion. Limited evaluation of the left lung with consolidation in the lingula and left lower lobe compared to 02/08/2017, concerning for pneumonia versus atelectasis. Increased interstitial markings within the right lung, likely edema. Bands of atelectasis right lower lobe.  Signed by: Huel Coventry   02/26/2017 3:05 PM    Xr Chest Ap Portable (1 View)    Result Date: 02/26/2017  PORTABLE  CHEST RADIOGRAPH COMPARISON: 02/25/2017 CLINICAL INDICATION:  hypoxia     IMPRESSION: Nodular airspace opacity throughout the left lung, grossly unchanged. Associated moderate left pleural effusion, also likely grossly stable. Increased right basilar opacity, query atelectasis. Stable partially obscured cardiomediastinal silhouette with mild pulmonary vascular congestion. Signed by: Gretta Arab   02/26/2017 11:34 AM    Xr Chest Ap Portable (1 View)    Result Date: 02/26/2017  XR CHEST AP 1V PORTABLE   02/25/2017 2:40 PM COMPARISON: 02/25/2017 at 0914 hours, CXR 02/08/2017, 02/25/2017 HISTORY: eval for volume     IMPRESSION: Left mediastinal mass with multi compartmental adenopathy demonstrated on CT chest 02/15/2017. Moderate left pleural effusion. Limited evaluation of the left lung with increased consolidation in the lingula and left lower lobe compared to 02/08/2017, concerning for pneumonia. Trace interstitial markings within the right lung, query mild edema.  Signed by: Huel Coventry   02/26/2017 9:23 AM    Xr Chest Ap Portable (1 View)    Result Date: 02/25/2017  EXAM: XR CHEST AP 1V PORTABLE on   02/25/2017 COMPARISON DATE: February 24, 2017 INDICATION: R/O PNEUMONIA     IMPRESSION: Opacified tumor burden left lung and small left pleural effusion and mediastinal adenopathy with enlarged cardiac mediastinal silhouette remain unchanged. Mild reticulation right lung, unchanged.  Signed by: Cherlynn Perches   02/25/2017 2:01 PM    Xr Chest Ap Portable (1 View)    Result Date: 02/24/2017  XR CHEST AP 1V PORTABLE  CLINICAL HISTORY: shortness of breath. COMPARISON: 03-12-2017 1152 hours     IMPRESSION: EKG wires and leads overlie the chest and limits evaluation of the underlying thoracic structures. The right lateral costophrenic sulcus was not entirely included on the field-of-views. When allowing for differences in patient positioning the partially visualized enlarged cardiomediastinal silhouette and pulmonary vascular congestion appears grossly stable. Diffuse opacification involving the left lung appears stable to slightly increased when compared to the prior radiograph. Slight increase in right infrahilar opacities. Moderate appearing left pleural effusion. No radiographic evidence of a pneumothorax. Signed by: Antonio Gutierrez   02/24/2017 11:40 AM    Xr Chest Ap Portable (1 View)    Result Date: March 12, 2017  EXAM:  XR CHEST AP 1V PORTABLE on   Mar 08, 2017 COMPARISON DATE: February 13, 2017 INDICATION: dyspnea     IMPRESSION: Unchanged enlarged cardiomediastinal silhouette from combination of mediastinal adenopathy and left hilar mass extending into upper lobe, and lower lobe with left lymphangitic carcinomatosis. Unchanged moderate left pleural effusion.  Signed by: Cherlynn Perches   03/08/17 12:57 PM    Xr Chest Ap Portable (1 View)    Result Date: 02/13/2017  XR CHEST AP 1V PORTABLE COMPARISON:  February 10, 2017 HISTORY: SOB, CP     IMPRESSION: Interval stability of left greater than right asymmetric interstitial alveolar opacities in favor of multifocal pneumonia superimposed on edema. Stable appearance of small layering left effusion and small right effusion. Stable heart and mediastinum. The thoracic aorta is mildly calcified, tortuous and ectatic. No acute findings or significant changes elsewhere. Signed by: Carie Caddy   02/13/2017 10:33 AM    US Duplex Lower Extremity Veins Bilat    Result Date: 02/27/2017  US DUPLEX LOWER EXTREMITY VEINS BILAT CLINICAL HISTORY: eval for lower ext clot. COMPARISON: 02/12/2017. TECHNIQUE: Real time grayscale, color, and spectral Doppler imaging of the bilateral common femoral, femoral, and popliteal veins was performed. Visualized portions of the peroneal, gastrocnemius, and posterior tibial veins were also assessed. FINDINGS: Right lower extremity: Common femoral vein: Patent and compressible Superior femoral vein: Patent and compressible Mid femoral vein: Patent and compressible Inferior femoral vein: Patent and compressible Popliteal vein: Patent and compressible. Sluggish flow noted. Calf veins: Patent and compressible, where visualized Left lower extremity: Common femoral vein: Patent and compressible Superior femoral vein: Patent and compressible Mid femoral vein: Patent and compressible Inferior femoral vein: Patent and compressible Popliteal vein: Patent and compressible Calf veins: Patent and compressible, where visualized     IMPRESSION:  No evidence of deep venous thrombosis of the bilateral lower extremity veins. Signed by: Frances Maywood   02/27/2017 1:04 PM    Echo, Adult, Transthoracic, Limited    Result Date: 03/09/2017  LIMITED TRANSTHORACIC ECHOCARDIOGRAM  Patient Name:   Ryan Shepard Date of Exam:     03/09/2017 Medical Rec #:  [id number]         Patient Type:     IP Accession #:    [id number]        Patient Location: 4ICU Date of Birth:  13-Mar-1940       Height:           73.0 in Patient Age:    76 years        Weight:           229.1 lb Patient Gender: M               BSA:              2.28 m???                                 BP:               101/48  Diagnosis:  I31.3-Pericardial effusion Indication: Evaluation pericardial effusion and for evidence of tamponade Referring Provider: Newt Lukes Sonographer 1:      Birjees Kingsley Spittle RDCS  Report prepared/completed by Lady Deutscher Rizwi RDCS on 03/09/2017 at 10:49:18 AM Other Study Information: Good image quality. Technically challenging study  related to the patients body habitus. Patient is                          intubated and on ventilator.  -------------------------------------------------------------------------- Tricuspid Valve and PA/RV Systolic Pressure: TR Max Velocity: 4.1 m/s RA Pressure: 15 mmHg RVSP/PASP: 82 mmHg Systemic Veins: IVC Diameter: 2.50 cm -------------------------------------------------------------------------- PHYSICIAN INTERPRETATION: LEFT VENTRICLE: Normal left ventricular size. Moderate septal hypertrophy. Hyperdynamic systolic function. Visually estimated left ventricular ejection fraction >75%. Mild LV diastolic dysfunction (grade I, impaired relaxation). RIGHT VENTRICLE: Normal right ventricular size and normal systolic function. RIGHT ATRIUM: IVC size and respiratory variation are not reliable for RA pressure estimation during mechanical ventilation. Right atrial pressure is estimated at 15 mmHg. MITRAL VALVE: Thickened mitral valve leaflets. No mitral annular calcification. Trace mitral valve regurgitation. TRICUSPID VALVE: Mild tricuspid valve regurgitation. PASP is 67 mmH + CVP. AORTIC VALVE: The aortic valve is trileaflet and structurally normal, with normal leaflet excursion. Trileaflet aortic valve morphology. Aortic valve area was not quantified by continuity equation on this study. No evidence of aortic regurgitation. PULMONIC VALVE: Structurally normal pulmonic valve, with normal leaflet excursion. Trace pulmonic valve regurgitation. SYSTEMIC VEINS: The inferior vena cava is dilated and exhibits less than 50% respiratory change. PERICARDIUM: Small localized pericardial effusion near the right ventricle. The pericardial effusion appears to contain mixed echogenic material. There is no significant echocardiographic evidence of cardiac tamponade. There is a significant pericardial fat pad present. Small anterior pericardial effusion, measuring up to 8-9 mm. No echocardiographic evidence of tamponade. No mitral valve respiratory-related inflow variation.  CONCLUSIONS:  1. Small anterior pericardial effusion, measuring up to 8-9 mm. No echocardiographic evidence of tamponade. No mitral valve respiratory-related inflow variation.  2. Left ventricular ejection fraction is approximately >75%.  3. PA systolic pressure is 67 mmH + CVP.  4. A prior echo performed on 02/27/2017 was reviewed for comparison. No significant changes noted since the previous study. [id number] Wyn Quaker MD Electronically signed by [id number] Wyn Quaker MD on 03/09/2017 at 11:13:22 AM  cc: Newt Lukes   Final     Ct Chest Angiogram W Contrast    Result Date: 02/27/2017  CT CHEST ANGIOGRAM W CONTRAST CLINICAL HISTORY: None provided. COMPARISON: CT chest 02/15/2017. TECHNIQUE: On a multirow-detector CT scanner, a CT angiogram of the chest was performed following the uneventful administration of contrast. Multiplanar reformations and MIP images in the coronal, sagittal and sagittal oblique projections were performed. CONTRAST: iohexol (Omnipaque) 350 mg/mL inj 100 mL. DOSE ESTIMATE: The patient received the following exposure event(s) during this study, and the dose reference values for each are as shown (CTDIvol in mGy, DLP in mGy-cm). Note that the values are not patient dose but numbers generated from scan acquisition factors based on 32 cm (body, ''a'') and/or 16 cm (head, ''b'') phantoms and may substantially under-estimate or over-estimate actual patient dose based on patient size and other factors. PreMonitoring (3a, 3); Monitoring (51a, 49); CTA Chest (18a, 663);  (); FINDINGS: Some images are motion degraded. Pulmonary vasculature: No pulmonary embolus to the segmental level excluding the right lower lobe as the images are degraded by motion artifact. The main pulmonary artery is dilated to 39 mm. Lungs: Interval placement of an endotracheal tube which terminates 3.7 cm above the carina. There are secretions in the upper airway. Moderate centrilobular emphysema. Bronchial and bronchiolar wall thickening. Redemonstration of coalescent airspace and nodular consolidation, most prominent in the left upper lobe and lingula, with some air bronchograms.  Lymph nodes: Stable enlarged and somewhat confluent, left greater than right mediastinal and hilar lymphadenopathy. Stable encasement and narrowing of the left common carotid artery. Adenopathy surrounds the brachiocephalic artery as well as the proximal  left subclavian artery without significant narrowing. Pleura: Interval increased size of moderate left pleural effusion. Multiple pleural nodules are unchanged. Cardiovascular: Normal heart size. Interval increased size of small pericardial effusion. Severe coronary artery atherosclerotic calcifications. Moderate aortic valve/leaflet atherosclerotic calcifications. Moderate atherosclerotic calcifications of aorta. Chest wall: Diffuse anasarca. Osseus: Heterogeneity of osseous structures including cervical spine and sternum, nonspecific.  Mild multilevel degenerative disc disease of the thoracic and lumbar lumbar spine. Upper abdomen: Enteric tube terminates in the stomach. 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     IMPRESSION: 1. No pulmonary embolus to the segmental level excluding the right lower lobe segmental arteries, where images are motion degraded. 2. Unchanged appearance of coalescent airspace and nodular consolidation, most prominent in the left upper lobe and lingula, as well as confluent mediastinal lymphadenopathy. 3. Reflux of contrast into the hepatic veins, suggesting elevated right heart pressures. 4. Interval increase in size of moderate left pleural effusion. 5. Enlarged main pulmonary artery, which may be seen in pulmonary arterial hypertension. Ryan Shepard, M.D., have reviewed this study at 02/27/2017 6:41 PM and I am in full agreement with the findings of the report. Dictated by: Everlean Patterson   02/27/2017 6:41 PM Signed by: Ryan Shepard   02/27/2017 6:47 PM    Ir Picc Cath Placement Age 5y Or More Portable    Result Date: 03/06/2017 Procedure:  Power Injectable PICC Placement Performing Provider:  Lemar Lofty, NP History:  The patient is a 77 year old  Male requiring a PICC for  IV access. Vascular Access History:  A central line is noted in the femoral vein. The patient has not had previous PICC placements in the upper extremity. This patient has no known history of a DVT noted on either upper extremity. Consent:  Informed written consent was obtained from the patient's wife following discussion of the risks, benefits and alternatives of the procedure.  Upper extremity evaluation:  Ultrasound was used and the right basilic vein was assessed as patent. One ultrasound image saved. Technique:  A timeout was performed.  To determine the catheter length, the patient's arm was measured from the insertion site to the SVC.  Hand hygiene was performed, the skin was prepped using chlorhexidine, and maximal sterile barriers were utilized. An injection of 1% Lidocaine was given subcutaneously for local anesthesia. The arm was interrogated with a 13.6 MHz transducer and the veins analyzed. Using ultrasound guidance the right basilic vein was punctured with a 21 gauge micropuncture needle. An 018 wire was advanced through the needle into the vein. A 5 French peel-away sheath was placed over the wire and advanced into the vein. The wire was taken out and a polyurethane catheter was inserted 48 cm leaving 0 cm external. The sheath was peeled  away and the catheter was secured in place with a statlock device, biopatch, and a transparent securement dressing.  Hemostasis was achieved with manual compression. The catheter was flushed with normal saline. The patient tolerated the procedure without apparent complications.  Chest x-ray ordered. Please refer to radiologist report for PICC tip location. Education:  The patient was given manufacturer education materials regarding the care of the catheter placed. Verbal education included CLABSI prevention (hand washing, cleaning the hub 15 seconds prior to use) and assessment for pain, edema, redness,  drainage and bleeding at the PICC site.     Impression:  Successful ultrasound guided placement of a 5 French double lumen non-valved power injectable PICC in the right upper extremity. One ultrasound image saved. Signed by: Lemar Lofty   03/06/2017 5:22 PM    US Kidney Non-vascular Bilat Portable    Result Date: 02/27/2017  US KIDNEY BILAT PORTABLE CLINICAL HISTORY: rule out renal obstruction COMPARISON: CT 02/15/2017. TECHNIQUE: Real time grayscale and color Doppler imaging of the kidneys and urinary bladder was performed. FINDINGS: Right kidney: Length = 11.9 cm Cortical thickness: Normal Echogenicity: Normal Collecting system: No hydronephrosis Other findings: None Left kidney: Length = 9.7 cm Cortical thickness: Normal Echogenicity: Normal Collecting system: No hydronephrosis Other findings: None Urinary bladder: Decompressed with a Foley catheter     IMPRESSION: Normal renal ultrasound. No hydronephrosis. I, Frances Maywood, M.D., have reviewed this radiological study personally and I am in full agreement with the findings of the report presented here. Dictated by: Danton Sewer   02/27/2017 2:22 PM Signed by: Frances Maywood   02/27/2017 3:07 PM    US Duplex Lower Extremity Veins Left Portable    Result Date: 02/12/2017  US DUPLEX LOWER EXTREMITY VEINS LEFT PORTABLE CLINICAL HISTORY: left foot swelling. COMPARISON: None. TECHNIQUE: Real time grayscale, color, and spectral Doppler imaging of the left common femoral, femoral, and popliteal veins was performed. Visualized portions of the peroneal, gastrocnemius, and posterior tibial veins were also assessed. FINDINGS: Common femoral vein: Patent and compressible Superior femoral vein: Patent and compressible Mid femoral vein: Patent and compressible Inferior femoral vein: Patent and compressible Popliteal vein: Patent and compressible Calf veins: Patent and compressible, where visualized     IMPRESSION:  No evidence of deep venous thrombosis of the left lower extremity veins. I, Ryan Shepard, M.D., have reviewed this radiological study personally and I am in full agreement with the findings of the report presented here. Dictated by: Jeanie Cooks   02/12/2017 1:12 PM Signed by: Theadore Nan Shepard   02/12/2017 1:44 PM    Xr Kub Portable (1 View)    Result Date: 03/07/2017  XR KUB 1V PORTABLE CLINICAL HISTORY: c/f ileus. COMPARISON: Abdominal radiograph 02/27/2017.     IMPRESSION: The inferior pelvis is excluded from the examination. The tip of the NG/OG tube is in the stomach body.  The bowel gas pattern is within normal limits. Dictated by: Rosalita Chessman   03/07/2017 9:47 AM Signed by: Clista Bernhardt   03/07/2017 10:16 AM    Xr Kub Portable (1 View)    Result Date: 02/28/2017  XR KUB 1V PORTABLE CLINICAL HISTORY: eval OG tube. COMPARISON: None.     IMPRESSION: A nasogastric tube is coiled in the stomach stomach with the tip in the region of the gastric fundus. There is moderate distention of the sigmoid colon. Vascular stents are present in the bilateral common iliac vessels. 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 Intermittent dyspnea and bilateral lower extremity edema. Sputum with aspergillus. NG tube placement. Compare: 03/03/2017 at 0831 hours.     IMPRESSION: ET, NG tube terminating within the gastric antrum. Unchanged bilateral mediastinal and bilateral hilar fullness, consistent with reactive lymphadenopathy. Unchanged patchy and nodular airspace consolidation involving the  central left lung, consistent with atypical pneumonia. Unchanged small left pleural effusion. Signed by: Kae Heller   03/03/2017 12:35 PM    US Liver Biopsy Mass Or Tumor    Result Date: 03/01/2017  ULTRASOUND-GUIDED PERCUTANEOUS CORE NEEDLE BIOPSY Procedure Date: 03/01/2017 Patient care facility: [facility] Procedural Personnel Attending(s): Ryan Masamed, MD Fellow(s): Ryan Banas, MD Resident(s): None Clinical History: Liver mass biopsy Prior imaging or comparison: CT of the abdomen and pelvis on 02/15/2017 PROCEDURES PERFORMED: 1. Ultrasound-guided core needle biopsy FINDINGS: Prior to intervention: 1. Target lesion location:  Hepatic segment 8 2. Target lesion size:  5.3 x 3.4 cm 3. Target lesion description:  Hypodense rounded lesion 4. Intraprocedural images:  Confirmed appropriate advancement of needle into the target lesion. Other observations: None Following intervention: 1. Post-biopsy ultrasound: No evidence of hematoma or other immediate complication. Other observations: None Intraprocedural or immediate post-procedural complications, occurrences or unexpected events and management: None.     IMPRESSION: Technically successful ultrasound-guided core needle biopsy of segment 8 hypodense lesion. PLAN: 1.  Disposition to inpatient ward for postprocedural monitored recovery 2.  Please refer to pathology department for results of biopsy. ______________________________________________________________________ PROCEDURE DETAILS Procedural consent obtained from: Patient Detailed explanation of the procedure, treatment options, and risks including but not limited to infection, bleeding, damage to surrounding structures, sedation reaction, benefits, and likelihood of success were explained prior to obtaining consent. A time-out was performed prior to the procedure: Yes Anesthesia/Sedation Level of anesthesia: Moderate sedation Administered by: Interventional radiology nurse under supervision of the attending physician Duration of anesthesia: 30 minutes Anesthesia/Sedation Medications See MAR. Local anesthesia: Lidocaine 1% 10 cc subcutaneously Additional Medications: Other Medications: None Contrast Agent: None Technical Details of the Procedure Patient position: Supine Access approach: subcostal Access technique:  17 gauge coaxial introducer needle Needle position: proximal margin of lesion Biopsy instrument type: Bard mission  18 gauge core biopsy system Biopsy tract embolization: Gel foam Additional equipment utilized: None Closure: Manual compression and sterile dressing Additional description of procedure or adjunctive procedures: None Specimens Removed:  Core needle biopsy x5 Estimated blood loss:  Minimal (<10 cc) Radiation dose: None ATTESTATION: I, Ryan Masamed, MD, attest that I was present for the entire procedure.  All elements of maximal sterile barrier technique, including hand hygiene and cutaneous antisepsis with 2% chlorhexidine and 70% isopropyl alcohol were used.  I reviewed the stored images and agree with the report as written. SUPPLEMENTAL INFORMATION None Department of Radiology is fully accredited for Obstetrics (1st, 2nd and 3rd Trimester), Gynecological, General and Vascular (Abdominal, Peripheral, cerebral and deep abdomen) Ultrasound by the [employer] of [employer]. Dictated by: Ryan Shepard   03/01/2017 2:11 PM Signed by: Theadore Nan Shepard   03/01/2017 2:14 PM        DISCHARGE PHYSICAL EXAM:   Spontaneous respirations absent.  Unresponsive to verbal or tactile stimuli.  Pupils pupillary light reflex absent.  Heart sounds absent, carotid pulse absent.    DISPOSITION:   Expired    Signed: Jacklynn Barnacle, MD [phone]) 03/26/2017 10:00 AM  [facility], PGY-2    Attending Addendum: I have seen and examined the patient and discussed the case with Dr. Krista Blue. I agree with the death summary - pt passed away on comfort care protocol based on family wishes given multiorgan failure and newly diagnosed SCLC.    Ryan Shepard S. Ryan Shepard

## 2017-03-14 NOTE — Significant Event
Called to pronounce Ryan Shepard.    Examination:   Spontaneous respirations absent.    Unresponsive to verbal or tactile stimuli.    Pupils pupillary light reflex absent.    Heart sounds absent, carotid pulse absent.    Patient pronounced at: 18    Family present.  Attending physician notified.  Coroner notified.    Autopsy Requested? No.    Claudette Laws, MD 03/13/2017 8:33 PM     Attending Addendum: Agree with above. Pt passed away on comfort measures only.    Bryona Foxworthy S. Regino Schultze

## 2017-03-14 NOTE — Other
Patients Clinical Goal:   Clinical Goal(s) for the Shift: 1.) MAP > 65, maintian hemodynamic stability. 2.) Monitor ventilation and oxygenation, on ARDS net. 3.) Tolerate TF. 4.) Insulin gtt per protocol. 5.) Heparin gtt per protocol. 6.) Safety and comfort.   Identify possible barriers to advancing the care plan:   Stability of the patient: Unstable - high likelihood or risk of patient condition declining or worsening; patient condition declining or worsening    End of Shift Summary:     Patient was placed on comfort care/EOL around 1400. Daughter Romie Levee (DPOA) requested to wait for more family members to come in before initiating EOL. Vasopressors turned off and CRRT off at 1800. 3 wishes offered and fulfilled, see note for more details. Terminal extubation after Glycopyrolate and Lorazepam administered. Emotional support provided to family.

## 2017-03-14 NOTE — Nursing Note
1900 Received report  1945 Pt is extubated, PRN medications were given per Md order and per protocol.   May 27, 2008 asystoly is noted Md was paged.   27-May-2012 is the time of death.   May 27, 2028 [facility] [employer] was contacted to report death, detailed information was provided, reference # [id number] Pt might qualify for corneal harvesting per representative [place]. Pt family was notified.   05/28/43 information provided to pt family members, requested copy of Md death certificate form were given.   05-27-2108 pt family left the unit. Condolences were given.

## 2017-03-16 DIAGNOSIS — R0989 Other specified symptoms and signs involving the circulatory and respiratory systems: Secondary | ICD-10-CM

## 2017-03-16 DIAGNOSIS — I10 Essential (primary) hypertension: Secondary | ICD-10-CM

## 2017-03-16 DIAGNOSIS — N179 Acute kidney failure, unspecified: Secondary | ICD-10-CM

## 2017-03-16 LAB — Blood Culture Detection
BLOOD CULTURE FINAL STATUS: NEGATIVE
BLOOD CULTURE FINAL STATUS: NEGATIVE

## 2017-03-17 LAB — Fungal Culture Respiratory: FUNGAL CULTURE RESPIRATORY: NEGATIVE

## 2017-03-18 LAB — Blood Culture Detection
BLOOD CULTURE FINAL STATUS: NEGATIVE
BLOOD CULTURE FINAL STATUS: NEGATIVE

## 2017-03-20 ENCOUNTER — Ambulatory Visit: Payer: MEDICARE

## 2017-03-24 LAB — Fungal Culture: FUNGAL CULTURE: NEGATIVE

## 2017-03-26 LAB — Blood Culture Fungal Detection
BLOOD CULTURE FUNGAL - FINAL: NEGATIVE
BLOOD CULTURE FUNGAL - FINAL: NEGATIVE
BLOOD CULTURE FUNGAL - FINAL: NEGATIVE
BLOOD CULTURE FUNGAL - FINAL: NEGATIVE

## 2017-03-29 ENCOUNTER — Encounter (HOSPITAL_COMMUNITY)
Admission: RE | Admit: 2017-03-29 | Discharge: 2017-03-29 | Disposition: A | Discharge status: Home / Self Care | Payer: PPO | Source: Ambulatory Visit | Attending: Family Medicine | Admitting: Family Medicine

## 2017-03-29 DIAGNOSIS — R911 Solitary pulmonary nodule: Secondary | ICD-10-CM | POA: Diagnosis not present

## 2017-03-29 DIAGNOSIS — C3490 Malignant neoplasm of unspecified part of unspecified bronchus or lung: Secondary | ICD-10-CM | POA: Diagnosis not present

## 2017-03-29 LAB — GLUCOSE, CAPILLARY: Glucose-Capillary: 89 mg/dL (ref 65–99)

## 2017-03-29 LAB — Fungal Culture Respiratory

## 2017-03-29 MED ORDER — FLUDEOXYGLUCOSE F - 18 (FDG) INJECTION: 9.2000 | Freq: Once | INTRAVENOUS | Status: DC | PRN

## 2017-03-30 DEATH — deceased

## 2017-04-02 ENCOUNTER — Ambulatory Visit: Payer: PRIVATE HEALTH INSURANCE

## 2017-04-02 DIAGNOSIS — Z1389 Encounter for screening for other disorder: Secondary | ICD-10-CM | POA: Diagnosis not present

## 2017-04-02 DIAGNOSIS — R918 Other nonspecific abnormal finding of lung field: Secondary | ICD-10-CM | POA: Diagnosis not present

## 2017-04-02 DIAGNOSIS — Z6826 Body mass index (BMI) 26.0-26.9, adult: Secondary | ICD-10-CM | POA: Diagnosis not present

## 2017-04-02 DIAGNOSIS — E663 Overweight: Secondary | ICD-10-CM | POA: Diagnosis not present

## 2017-04-06 ENCOUNTER — Encounter: Payer: Self-pay | Admitting: Gastroenterology

## 2017-04-06 ENCOUNTER — Ambulatory Visit (INDEPENDENT_AMBULATORY_CARE_PROVIDER_SITE_OTHER): Payer: PPO | Admitting: Gastroenterology

## 2017-04-06 ENCOUNTER — Telehealth: Payer: Self-pay | Admitting: *Deleted

## 2017-04-06 ENCOUNTER — Encounter: Payer: Self-pay | Admitting: *Deleted

## 2017-04-06 ENCOUNTER — Other Ambulatory Visit: Payer: Self-pay | Admitting: *Deleted

## 2017-04-06 ENCOUNTER — Ambulatory Visit (HOSPITAL_COMMUNITY): Payer: PPO | Admitting: Internal Medicine

## 2017-04-06 VITALS — BP 172/88 | HR 96 | Temp 97.7°F | Ht 70.0 in | Wt 178.2 lb

## 2017-04-06 DIAGNOSIS — Z860100 Personal history of colon polyps, unspecified: Secondary | ICD-10-CM | POA: Insufficient documentation

## 2017-04-06 DIAGNOSIS — K7030 Alcoholic cirrhosis of liver without ascites: Secondary | ICD-10-CM | POA: Diagnosis not present

## 2017-04-06 DIAGNOSIS — K7460 Unspecified cirrhosis of liver: Secondary | ICD-10-CM | POA: Insufficient documentation

## 2017-04-06 LAB — Acid-Fast Culture and Stain, Respiratory
ACID-FAST STAIN (FLUOROCHROME): NONE SEEN
ACID-FAST STAIN (FLUOROCHROME): NONE SEEN

## 2017-04-06 MED ORDER — PEG 3350-KCL-NA BICARB-NACL 420 G PO SOLR: 4000.0000 mL | Freq: Once | ORAL | 0 refills | Status: AC

## 2017-04-06 NOTE — Patient Instructions (Signed)
Please take the order for the Hepatitis A and B vaccinations to Dr. Hilma Favors. If you have never had these, it's good to have due to your history.  I am so proud of you! Keep up the great work!  We have scheduled you for a routine surveillance colonoscopy and a screening upper endoscopy to assess for esophageal varices. We will see you in 6 months!  I am getting any recent blood work from your primary care. You may need additional blood work: we will review this first.  It was a pleasure to see you today. I strive to create trusting relationships with patients to provide genuine, compassionate, and quality care. I value your feedback. If you receive a survey regarding your visit,  I greatly appreciate you the taking time to fill this out.   Annitta Needs, PhD, ANP-BC [facility]

## 2017-04-06 NOTE — Assessment & Plan Note (Signed)
77 year old male with history of adenomas and due for surveillance now, with last colonoscopy in 2013. No concerning lower or upper GI symptoms.  Proceed with TCS with Dr. Gala Romney in near future: the risks, benefits, and alternatives have been discussed with the patient in detail. The patient states understanding and desires to proceed. Due to history of long-standing ETOH abuse (he has been sober for over a year now actually), will utilize Propofol

## 2017-04-06 NOTE — Assessment & Plan Note (Addendum)
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 Applauded on cessation, with last heavy use 1.5 years ago. Admits that he had one drink on the past 2 Super[employer] Sundays. He has been overdue for follow-up and surveillance. No signs or symptoms of decompensation currently. Unfortunately, appears he may have a primary bronchogenic carcinoma that was noted on CT chest and followed by PET scan. Will be seeing Oncology April 09, 2017. Concern for metastatic involvement as noted by hypermetabolic right paratracheal lymph node. No uptake in liver or pancreas on PET scan. History of pancreatic cysts felt to be benign noted in 2013, not obvious on recent PET.   Will plan on EGD for variceal screening at time of colonoscopy (last in 2013 without evidence of varices). Risks, benefits, alternatives discussed with patient. Will use Propofol due to history of ETOH abuse. He was applauded on overall cessation Obtain labs from PCP: may need additional labs (received 04/10/17. Dated Dec 2018. Hgb 14.8, platelets 216, alk phos 90, AST 23, ALT 15, bilirubin 0.7. No INR).  Needs Hep A/B vaccination as he has never had this completed per his report Return in 6 months for cirrhosis care Keep Oncology appt 04/09/17.

## 2017-04-06 NOTE — H&P (View-Only) (Signed)
Primary Care Physician:  Sharilyn Sites, MD Primary Gastroenterologist:  Dr. Gala Romney   Chief Complaint  Patient presents with  . Cirrhosis    f/u. Doing okay  . Colonoscopy    consult    HPI:   Ian Aguilar is a 77 y.o. male presenting today at the request of Dr. Hilma Favors to arrange surveillance colonoscopy. He has a history of adenomas, with last colonoscopy in 2013. Last EGD in 2013 with erosive esophagitis. He has been lost to follow-up for cirrhosis care. Cirrhosis presumably felt to be related to alcohol. I note that he also had concern for pancreatic lesions in the past, with MRI in 2013 noting two tiny cystic lesions in the head and proximal body of the pancreas, the largest measuring up to 8 mm in diameter. Felt to be benign but recommended 1 year surveillance. He did not complete surveillance MRI. However, he did have a PET scan done Mar 29, 2017, after suspicious nodule found on CT chest lung cancer screening. PET scan worrisome for primary bronchogenic carcinoma and a hypermetabolic right paratracheal lymph node worrisome for metastatic involvement. No uptake in liver or pancreas. Liver was cirrhotic. He will be seeing Oncology April 09, 2017.   States he stopped drinking alcohol 1.5 years ago except for [facility] Sunday, when he had one shot of bourbon with 7 up. BM every 2-3 days. Has stool softener at home but forgets to take it. Very rare straining. Doesn't feel like he needs prescriptive agent. No abdominal pain, distension, N/V, dysphagia, rectal bleeding, melena. No confusion or mental status changes. Applauded on ETOH cessation (except for once at time of Superbowl). He states he is proud of himself as well. Has not completed Hep A and B vaccination. Hep B surface antigen and Hep C antibody negative in past. Normal iron studies in remote past.   Past Medical History:  Diagnosis Date  . Anxiety   . Arthritis   . Cirrhosis ([place])    confirmed by MRI on 07/04/11.  no  immunizations  . COPD (chronic obstructive pulmonary disease) ([place])   . Depression with anxiety   . ETOH abuse   . Hyperlipidemia     Past Surgical History:  Procedure Laterality Date  . bilateral cataract surgery     [place]  . broken arm  6 yrs ago   left otif of wrist-[place]  . CLOSED REDUCTION WRIST FRACTURE Right 09/02/2015   Procedure: RIGHT WRIST REDUCTION;  Surgeon: Renette Butters, MD;  Location: [place];  Service: Orthopedics;  Laterality: Right;  with MAC  . COLONOSCOPY  1996   [place]: external hemorrhoids, no polyps  . COLONOSCOPY  06/28/2011   Dr. Ala Bent diverticulosis,tubular adenoma, hyperplastic polyp  . ESOPHAGOGASTRODUODENOSCOPY  06/28/2011   Dr. Chelsea Aus erosive reflux esophagitis, hiatal hernia-gastritis  . EXTERNAL EAR SURGERY     right ear-cleaned out ear and created new eardrum  . INGUINAL HERNIA REPAIR  2-3 yrs ago   left-[place]  . INTRAMEDULLARY (IM) NAIL INTERTROCHANTERIC Right 09/02/2015   Procedure: RIGHT  INTERTROCHANTRIC HIP;  Surgeon: Renette Butters, MD;  Location: [place];  Service: Orthopedics;  Laterality: Right;  With MAC  . KNEE SURGERY     left-arthroscopy-[place]  . SKULL FRACTURE ELEVATION     fractured cheeck bone repaired  . TOTAL HIP ARTHROPLASTY  12/18/2011   Procedure: TOTAL HIP ARTHROPLASTY;  Surgeon: Carole Civil, MD;  Location: [facility];  Service: Orthopedics;  Laterality: Left;  . WISDOM TOOTH  EXTRACTION      Current Outpatient Medications  Medication Sig Dispense Refill  . Cholecalciferol (VITAMIN D PO) Take 1 capsule by mouth every morning. 1000 units    . docusate sodium (COLACE) 100 MG capsule Take 1 capsule (100 mg total) by mouth 2 (two) times daily. 60 capsule 0  . Nutritional Supplements (ENSURE ENLIVE PO) Ensure Enlive give twice a day due to poor intake and increased nutrient needs. AS NEEDED    . PROAIR HFA 108 (90 Base) MCG/ACT inhaler as needed.  2  . tiotropium (SPIRIVA) 18 MCG inhalation capsule  Place 18 mcg into inhaler and inhale every morning.     . polyethylene glycol-electrolytes (NULYTELY/GOLYTELY) 420 g solution Take 4,000 mLs by mouth once for 1 dose. 4000 mL 0   No current facility-administered medications for this visit.     Allergies as of 04/06/2017  . (No Known Allergies)    Family History  Problem Relation Age of Onset  . Stroke Mother 21  . Heart disease Father 81       MI  . Diabetes Maternal Uncle   . Colon cancer Neg Hx   . Cancer Neg Hx     Social History   Socioeconomic History  . Marital status: Married    Spouse name: Not on file  . Number of children: Not on file  . Years of education: Not on file  . Highest education level: Not on file  Social Needs  . Financial resource strain: Not on file  . Food insecurity - worry: Not on file  . Food insecurity - inability: Not on file  . Transportation needs - medical: Not on file  . Transportation needs - non-medical: Not on file  Occupational History  . Occupation: retired    Fish farm manager: [employer]  Tobacco Use  . Smoking status: Current Every Day Smoker    Packs/day: 0.50    Years: 60.00    Pack years: 30.00    Types: Cigarettes  . Smokeless tobacco: Never Used  . Tobacco comment: 1/2 ppd > 60 years  Substance and Sexual Activity  . Alcohol use: Yes    Comment: history of ETOH abuse, stopped 1.5 years ago (had one drink at the superbowl).   . Drug use: No  . Sexual activity: Yes    Birth control/protection: None  Other Topics Concern  . Not on file  Social History Narrative  . Not on file    Review of Systems: Gen: Denies any fever, chills, fatigue, weight loss, lack of appetite.  CV: Denies chest pain, heart palpitations, peripheral edema, syncope.  Resp: Denies shortness of breath at rest or with exertion. Denies wheezing or cough.  GI: see HPI  GU : Denies urinary burning, urinary frequency, urinary hesitancy MS: Denies joint pain, muscle weakness, cramps, or  limitation of movement.  Derm: Denies rash, itching, dry skin Psych: Denies depression, anxiety, memory loss, and confusion Heme: Denies bruising, bleeding, and enlarged lymph nodes.  Physical Exam: BP (!) 172/88   Pulse 96   Temp 97.7 F (36.5 C) (Oral)   Ht _0  (1.778 m)   Wt 178 lb 3.2 oz (80.8 kg)   BMI 25.57 kg/m  General:   Alert and oriented. Pleasant and cooperative. Well-nourished and well-developed.  Head:  Normocephalic and atraumatic. Eyes:  Without icterus, sclera clear and conjunctiva pink.  Ears:  Normal auditory acuity. Nose:  No deformity, discharge,  or lesions. Mouth:  No deformity or lesions, oral  mucosa pink.  Lungs:  Clear to auscultation bilaterally. No wheezes, rales, or rhonchi. No distress.  Heart:  S1, S2 present without murmurs appreciated.  Abdomen:  +BS, soft, non-tender and non-distended. No HSM noted. No guarding or rebound. No masses appreciated.  Rectal:  Deferred  Msk:  Symmetrical without gross deformities. Normal posture. Extremities:  Without  edema. Neurologic:  Alert and  oriented x4 Psych:  Alert and cooperative. Normal mood and affect.

## 2017-04-06 NOTE — Telephone Encounter (Signed)
Spoke with pt. TCS/EGD W/ propofol is scheduled for 04/26/17 at 2:100pm. Prep sent to the pharmacy. Discussed/mailed instructions to pt.

## 2017-04-06 NOTE — Progress Notes (Signed)
Primary Care Physician:  Sharilyn Sites, MD Primary Gastroenterologist:  Dr. Gala Romney   Chief Complaint  Patient presents with  . Cirrhosis    f/u. Doing okay  . Colonoscopy    consult    HPI:   Ian Aguilar is a 77 y.o. male presenting today at the request of Dr. Hilma Favors to arrange surveillance colonoscopy. He has a history of adenomas, with last colonoscopy in 2013. Last EGD in 2013 with erosive esophagitis. He has been lost to follow-up for cirrhosis care. Cirrhosis presumably felt to be related to alcohol. I note that he also had concern for pancreatic lesions in the past, with MRI in 2013 noting two tiny cystic lesions in the head and proximal body of the pancreas, the largest measuring up to 8 mm in diameter. Felt to be benign but recommended 1 year surveillance. He did not complete surveillance MRI. However, he did have a PET scan done Mar 29, 2017, after suspicious nodule found on CT chest lung cancer screening. PET scan worrisome for primary bronchogenic carcinoma and a hypermetabolic right paratracheal lymph node worrisome for metastatic involvement. No uptake in liver or pancreas. Liver was cirrhotic. He will be seeing Oncology April 09, 2017.   States he stopped drinking alcohol 1.5 years ago except for [facility] Sunday, when he had one shot of bourbon with 7 up. BM every 2-3 days. Has stool softener at home but forgets to take it. Very rare straining. Doesn't feel like he needs prescriptive agent. No abdominal pain, distension, N/V, dysphagia, rectal bleeding, melena. No confusion or mental status changes. Applauded on ETOH cessation (except for once at time of Superbowl). He states he is proud of himself as well. Has not completed Hep A and B vaccination. Hep B surface antigen and Hep C antibody negative in past. Normal iron studies in remote past.   Past Medical History:  Diagnosis Date  . Anxiety   . Arthritis   . Cirrhosis ([place])    confirmed by MRI on 07/04/11.  no  immunizations  . COPD (chronic obstructive pulmonary disease) ([place])   . Depression with anxiety   . ETOH abuse   . Hyperlipidemia     Past Surgical History:  Procedure Laterality Date  . bilateral cataract surgery     [place]  . broken arm  6 yrs ago   left otif of wrist-[place]  . CLOSED REDUCTION WRIST FRACTURE Right 09/02/2015   Procedure: RIGHT WRIST REDUCTION;  Surgeon: Renette Butters, MD;  Location: [place];  Service: Orthopedics;  Laterality: Right;  with MAC  . COLONOSCOPY  1996   [place]: external hemorrhoids, no polyps  . COLONOSCOPY  06/28/2011   Dr. Ala Bent diverticulosis,tubular adenoma, hyperplastic polyp  . ESOPHAGOGASTRODUODENOSCOPY  06/28/2011   Dr. Chelsea Aus erosive reflux esophagitis, hiatal hernia-gastritis  . EXTERNAL EAR SURGERY     right ear-cleaned out ear and created new eardrum  . INGUINAL HERNIA REPAIR  2-3 yrs ago   left-[place]  . INTRAMEDULLARY (IM) NAIL INTERTROCHANTERIC Right 09/02/2015   Procedure: RIGHT  INTERTROCHANTRIC HIP;  Surgeon: Renette Butters, MD;  Location: [place];  Service: Orthopedics;  Laterality: Right;  With MAC  . KNEE SURGERY     left-arthroscopy-[place]  . SKULL FRACTURE ELEVATION     fractured cheeck bone repaired  . TOTAL HIP ARTHROPLASTY  12/18/2011   Procedure: TOTAL HIP ARTHROPLASTY;  Surgeon: Carole Civil, MD;  Location: [facility];  Service: Orthopedics;  Laterality: Left;  . WISDOM TOOTH  EXTRACTION      Current Outpatient Medications  Medication Sig Dispense Refill  . Cholecalciferol (VITAMIN D PO) Take 1 capsule by mouth every morning. 1000 units    . docusate sodium (COLACE) 100 MG capsule Take 1 capsule (100 mg total) by mouth 2 (two) times daily. 60 capsule 0  . Nutritional Supplements (ENSURE ENLIVE PO) Ensure Enlive give twice a day due to poor intake and increased nutrient needs. AS NEEDED    . PROAIR HFA 108 (90 Base) MCG/ACT inhaler as needed.  2  . tiotropium (SPIRIVA) 18 MCG inhalation capsule  Place 18 mcg into inhaler and inhale every morning.     . polyethylene glycol-electrolytes (NULYTELY/GOLYTELY) 420 g solution Take 4,000 mLs by mouth once for 1 dose. 4000 mL 0   No current facility-administered medications for this visit.     Allergies as of 04/06/2017  . (No Known Allergies)    Family History  Problem Relation Age of Onset  . Stroke Mother 73  . Heart disease Father 92       MI  . Diabetes Maternal Uncle   . Colon cancer Neg Hx   . Cancer Neg Hx     Social History   Socioeconomic History  . Marital status: Married    Spouse name: Not on file  . Number of children: Not on file  . Years of education: Not on file  . Highest education level: Not on file  Social Needs  . Financial resource strain: Not on file  . Food insecurity - worry: Not on file  . Food insecurity - inability: Not on file  . Transportation needs - medical: Not on file  . Transportation needs - non-medical: Not on file  Occupational History  . Occupation: retired    Employer: [employer]  Tobacco Use  . Smoking status: Current Every Day Smoker    Packs/day: 0.50    Years: 60.00    Pack years: 30.00    Types: Cigarettes  . Smokeless tobacco: Never Used  . Tobacco comment: 1/2 ppd > 60 years  Substance and Sexual Activity  . Alcohol use: Yes    Comment: history of ETOH abuse, stopped 1.5 years ago (had one drink at the superbowl).   . Drug use: No  . Sexual activity: Yes    Birth control/protection: None  Other Topics Concern  . Not on file  Social History Narrative  . Not on file    Review of Systems: Gen: Denies any fever, chills, fatigue, weight loss, lack of appetite.  CV: Denies chest pain, heart palpitations, peripheral edema, syncope.  Resp: Denies shortness of breath at rest or with exertion. Denies wheezing or cough.  GI: see HPI  GU : Denies urinary burning, urinary frequency, urinary hesitancy MS: Denies joint pain, muscle weakness, cramps, or  limitation of movement.  Derm: Denies rash, itching, dry skin Psych: Denies depression, anxiety, memory loss, and confusion Heme: Denies bruising, bleeding, and enlarged lymph nodes.  Physical Exam: BP (!) 172/88   Pulse 96   Temp 97.7 F (36.5 C) (Oral)   Ht 5' 10" (1.778 m)   Wt 178 lb 3.2 oz (80.8 kg)   BMI 25.57 kg/m  General:   Alert and oriented. Pleasant and cooperative. Well-nourished and well-developed.  Head:  Normocephalic and atraumatic. Eyes:  Without icterus, sclera clear and conjunctiva pink.  Ears:  Normal auditory acuity. Nose:  No deformity, discharge,  or lesions. Mouth:  No deformity or lesions, oral   mucosa pink.  Lungs:  Clear to auscultation bilaterally. No wheezes, rales, or rhonchi. No distress.  Heart:  S1, S2 present without murmurs appreciated.  Abdomen:  +BS, soft, non-tender and non-distended. No HSM noted. No guarding or rebound. No masses appreciated.  Rectal:  Deferred  Msk:  Symmetrical without gross deformities. Normal posture. Extremities:  Without  edema. Neurologic:  Alert and  oriented x4 Psych:  Alert and cooperative. Normal mood and affect.

## 2017-04-09 ENCOUNTER — Other Ambulatory Visit: Payer: Self-pay

## 2017-04-09 ENCOUNTER — Inpatient Hospital Stay (HOSPITAL_COMMUNITY): Payer: PPO | Attending: Internal Medicine | Admitting: Internal Medicine

## 2017-04-09 ENCOUNTER — Encounter (HOSPITAL_COMMUNITY): Payer: Self-pay | Admitting: Internal Medicine

## 2017-04-09 ENCOUNTER — Telehealth: Payer: Self-pay | Admitting: *Deleted

## 2017-04-09 ENCOUNTER — Encounter: Payer: Self-pay | Admitting: *Deleted

## 2017-04-09 VITALS — BP 158/77 | HR 76 | Temp 98.6°F | Resp 18 | Ht 69.0 in | Wt 178.4 lb

## 2017-04-09 DIAGNOSIS — R918 Other nonspecific abnormal finding of lung field: Secondary | ICD-10-CM | POA: Diagnosis not present

## 2017-04-09 DIAGNOSIS — I10 Essential (primary) hypertension: Secondary | ICD-10-CM | POA: Diagnosis not present

## 2017-04-09 DIAGNOSIS — K7460 Unspecified cirrhosis of liver: Secondary | ICD-10-CM

## 2017-04-09 DIAGNOSIS — F17210 Nicotine dependence, cigarettes, uncomplicated: Secondary | ICD-10-CM | POA: Diagnosis not present

## 2017-04-09 DIAGNOSIS — M25559 Pain in unspecified hip: Secondary | ICD-10-CM

## 2017-04-09 NOTE — Patient Instructions (Signed)
[place] at [facility] Discharge Instructions  RECOMMENDATIONS MADE BY THE CONSULTANT AND ANY TEST RESULTS WILL BE SENT TO YOUR REFERRING PHYSICIAN.  We are going to refer you to Dr. Roxan Hockey - a cardiothoracic surgeon in [place]. He will review your scans and probably get a biopsy from the area that showed up on the CT scan. A biopsy would remove a sample of tissue from the area that looks suspicious on the CT scan.   Please try to stop smoking.   Keep your colonoscopy appointment that you have scheduled.   Try some Over-the-Counter Zyrtec (generic kind is fine) to see if that will help the dry cough.   You will return to [place] after Dr. Roxan Hockey has completed his part.   Tentatively plan on returning to [place] in 2-3 weeks     Thank you for choosing [place] at [facility] to provide your oncology and hematology care.  To afford each patient quality time with our provider, please arrive at least 15 minutes before your scheduled appointment time.    If you have a lab appointment with the [place] please come in thru the  Main Entrance and check in at the main information desk  You need to re-schedule your appointment should you arrive 10 or more minutes late.  We strive to give you quality time with our providers, and arriving late affects you and other patients whose appointments are after yours.  Also, if you no show three or more times for appointments you may be dismissed from the clinic at the providers discretion.     Again, thank you for choosing [facility].  Our hope is that these requests will decrease the amount of time that you wait before being seen by our physicians.       _____________________________________________________________  Should you have questions after your visit to [facility], please contact our office at [phone] between the hours of 8:30 a.m. and 4:30 p.m.   Voicemails left after 4:30 p.m. will not be returned until the following business day.  For prescription refill requests, have your pharmacy contact our office.       Resources For Cancer Patients and their Caregivers ? American Cancer Society: Can assist with transportation, wigs, general needs, runs Look Good Feel Better.        ([phone] [phone] ? Cancer Care: Provides financial assistance, online support groups, medication/co-pay assistance.  1-800-813-HOPE 315-248-8472) ? [place] Assists [place] cancer patients and their families through emotional , educational and financial support.  517-678-5441 ? [place] DSS Where to apply for food stamps, Medicaid and utility assistance. (812)260-3434 ? [facility]: Transportation to medical appointments. 660-702-0404 ? Social Security Administration: May apply for disability if have a Stage IV cancer. 518-007-8392 (618)545-4407 ? [employer], Disability and Transit Services: Assists with nutrition, care and transit needs. [place] Support Programs: @10RELATIVEDAYS @ > Cancer Support Group  2nd Tuesday of the month 1pm-2pm, Journey Room  > Creative Journey  3rd Tuesday of the month 1130am-1pm, Journey Room  > Look Good Feel Better  1st Wednesday of the month 10am-12 noon, Journey Room (Call [place] to register [phone])

## 2017-04-09 NOTE — Telephone Encounter (Signed)
Pre-op scheduled for 04/23/17 at 1:45pm. Letter mailed to pt

## 2017-04-09 NOTE — Telephone Encounter (Signed)
LMOVM to make aware

## 2017-04-09 NOTE — Progress Notes (Signed)
CC'D TO PCP °

## 2017-04-18 ENCOUNTER — Institutional Professional Consult (permissible substitution): Payer: PPO | Admitting: Thoracic Surgery (Cardiothoracic Vascular Surgery)

## 2017-04-18 VITALS — BP 180/83 | HR 72 | Resp 20 | Ht 69.0 in | Wt 178.0 lb

## 2017-04-18 DIAGNOSIS — R911 Solitary pulmonary nodule: Secondary | ICD-10-CM | POA: Diagnosis not present

## 2017-04-18 NOTE — H&P (View-Only) (Signed)
PCP is Sharilyn Sites, MD Referring Provider is Higgs, Mathis Dad, MD  Chief Complaint  Patient presents with  . Lung Lesion    Surgical eval, Chest CT 03/06/17, Pet Scan 03/29/17    HPI: Mr. Verga is sent for consultation regarding a right upper lobe lung nodule with mediastinal adenopathy.  [name] is a 77 year old man with a long-standing history of tobacco abuse, COPD, ethanol abuse (quit 2 years ago), cirrhosis, hyperlipidemia, arthritis, and depression with anxiety.  He recently had a low-dose CT for screening for lung cancer.  It showed a suspicious right upper lobe nodule and also an enlarged right paratracheal lymph node.  A PET/CT was done which showed both of these lesions were hypermetabolic.  There was also some uptake in the inferior aspect of the L4 spinous process with an SUV of 4.36.  Mr. [name] says he has been feeling well.  He does get short of breath with exertion.  That is a long-standing problem and has not changed recently.  He has a chronic cough.  He sometimes coughs up mucus but swallows it.  He is not aware of any hemoptysis.  He denies any chest pain, pressure, or tightness with exertion.  He is aware that he has cirrhosis but is not aware of any problems related to it.  He denies any hoarseness, change in appetite, weight loss, unusual headaches or visual changes.  He has chronic right hip pain.  Zubrod Score: At the time of surgery this patient's most appropriate activity status/level should be described as: _0     0    Normal activity, no symptoms _1     1    Restricted in physical strenuous activity but ambulatory, able to do out light work _2     2    Ambulatory and capable of self care, unable to do work activities, up and about >50 % of waking hours                              _3     3    Only limited self care, in bed greater than 50% of waking hours _4     4    Completely disabled, no self care, confined to bed or chair _5     5    Moribund  Past Medical  History:  Diagnosis Date  . Anxiety   . Arthritis   . Cirrhosis ([place])    confirmed by MRI on 07/04/11.  no immunizations  . COPD (chronic obstructive pulmonary disease) ([place])   . Depression with anxiety   . ETOH abuse   . Hyperlipidemia     Past Surgical History:  Procedure Laterality Date  . bilateral cataract surgery     [place]  . broken arm  6 yrs ago   left otif of wrist-[place]  . CLOSED REDUCTION WRIST FRACTURE Right 09/02/2015   Procedure: RIGHT WRIST REDUCTION;  Surgeon: Renette Butters, MD;  Location: [place];  Service: Orthopedics;  Laterality: Right;  with MAC  . COLONOSCOPY  1996   [place]: external hemorrhoids, no polyps  . COLONOSCOPY  06/28/2011   Dr. Ala Bent diverticulosis,tubular adenoma, hyperplastic polyp  . ESOPHAGOGASTRODUODENOSCOPY  06/28/2011   Dr. Chelsea Aus erosive reflux esophagitis, hiatal hernia-gastritis  . EXTERNAL EAR SURGERY     right ear-cleaned out ear and created new eardrum  . INGUINAL HERNIA REPAIR  2-3 yrs ago   left-[place]_  . INTRAMEDULLARY (IM) NAIL INTERTROCHANTERIC Right 09/02/2015  Procedure: RIGHT  INTERTROCHANTRIC HIP;  Surgeon: Renette Butters, MD;  Location: [place];  Service: Orthopedics;  Laterality: Right;  With MAC  . KNEE SURGERY     left-arthroscopy-[place]  . SKULL FRACTURE ELEVATION     fractured cheeck bone repaired  . TOTAL HIP ARTHROPLASTY  12/18/2011   Procedure: TOTAL HIP ARTHROPLASTY;  Surgeon: Carole Civil, MD;  Location: [facility];  Service: Orthopedics;  Laterality: Left;  . WISDOM TOOTH EXTRACTION      Family History  Problem Relation Age of Onset  . Stroke Mother 87  . Heart disease Father 39       MI  . Diabetes Maternal Uncle   . Colon cancer Neg Hx   . Cancer Neg Hx     Social History Social History   Tobacco Use  . Smoking status: Current Every Day Smoker    Packs/day: 0.25    Years: 62.00    Pack years: 15.50    Types: Cigarettes  . Smokeless tobacco: Never Used  . Tobacco  comment: 1/2 ppd > 60 years  Substance Use Topics  . Alcohol use: Yes    Comment: history of ETOH abuse, stopped 1.5 years ago (had one drink at [place] [employer]).   . Drug use: No    Current Outpatient Medications  Medication Sig Dispense Refill  . cholecalciferol (VITAMIN D) 1000 units tablet Take 1,000 Units by mouth daily.    Marland Kitchen docusate sodium (COLACE) 100 MG capsule Take 1 capsule (100 mg total) by mouth 2 (two) times daily. (Patient taking differently: Take 100 mg by mouth 2 (two) times daily as needed (for constipation (no bowel movement in 72 hours)). ) 60 capsule 0  . Nutritional Supplements (ENSURE ENLIVE PO) Take 237 mLs by mouth daily as needed (for poor intake).     Marland Kitchen PROAIR HFA 108 (90 Base) MCG/ACT inhaler Inhale 2 puffs into the lungs every 6 (six) hours as needed (for shortness of breath/wheezing/cough).   2  . Propylene Glycol-Glycerin (SOOTHE) 0.6-0.6 % SOLN Place 1-2 drops into both eyes 3 (three) times daily as needed (for dry/irritated eyes.).    Marland Kitchen Skin Protectants, Misc. (EUCERIN) cream Apply 1 application topically 4 (four) times daily as needed for dry skin (for itchy or dry skin).    . Tiotropium Bromide Monohydrate (SPIRIVA RESPIMAT) 2.5 MCG/ACT AERS Inhale 1 puff into the lungs every evening.     No current facility-administered medications for this visit.     No Known Allergies  Review of Systems  Constitutional: Negative for activity change, chills, fever and unexpected weight change.  HENT: Negative for trouble swallowing and voice change.   Eyes: Negative for visual disturbance.  Respiratory: Positive for cough and shortness of breath. Negative for wheezing.   Cardiovascular: Negative for chest pain.  Gastrointestinal: Negative for abdominal pain and blood in stool.  Genitourinary: Negative for difficulty urinating and dysuria.  Musculoskeletal: Positive for arthralgias and gait problem (Uses cane when in unfamiliar area). Negative for myalgias.   Neurological: Negative for headaches.       Balance problem with history of falls, none recently  Hematological: Negative for adenopathy. Does not bruise/bleed easily.  Psychiatric/Behavioral: Positive for dysphoric mood. The patient is nervous/anxious.   All other systems reviewed and are negative.   BP (!) 180/83   Pulse 72   Resp 20   Ht _0  (1.753 m)   Wt 178 lb (80.7 kg)   SpO2 95% Comment: RA  BMI 26.29 kg/m  Physical Exam  Constitutional: He is oriented to person, place, and time. He appears well-developed and well-nourished. No distress.  HENT:  Head: Normocephalic and atraumatic.  Mouth/Throat: No oropharyngeal exudate.  Eyes: Conjunctivae and EOM are normal. No scleral icterus.  Neck: No thyromegaly present.  Cardiovascular: Normal rate, regular rhythm and normal heart sounds.  No murmur heard. Pulmonary/Chest: Effort normal. No respiratory distress. He has no wheezes. He has no rales.  Diminished breath sounds bilaterally  Abdominal: Soft. He exhibits no distension. There is no tenderness.  Musculoskeletal: He exhibits deformity (Right leg shorter,).  Lymphadenopathy:    He has no cervical adenopathy.  Neurological: He is alert and oriented to person, place, and time. No cranial nerve deficit.  Good strength in all 4 extremities  Skin: Skin is warm and dry.  Vitals reviewed.    Diagnostic Tests: CT CHEST WITHOUT CONTRAST LOW-DOSE FOR LUNG CANCER SCREENING  TECHNIQUE: Multidetector CT imaging of the chest was performed following the standard protocol without IV contrast.  COMPARISON:  None.  FINDINGS: Cardiovascular: The heart is normal in size. No pericardial effusion.  No evidence of thoracic aortic aneurysm. Atherosclerotic calcifications of the aortic arch.  Three vessel coronary atherosclerosis.  Mediastinum/Nodes: Right paratracheal nodes measuring 12-13 mm short axis (series 2/images 20 and 22).  Visualized thyroid is  unremarkable.  Lungs/Pleura: 12.4 mm spiculated nodule in the medial right lung apex, suspicious.  Mild centrilobular emphysematous changes, upper lobe predominant.  Linear scarring in the lingula.  Mild biapical pleural-parenchymal scarring.  No focal consolidation.  No pleural effusion or pneumothorax.  Upper Abdomen: Visualized upper abdomen is grossly unremarkable.  Musculoskeletal: Mild anterior wedging at L1.  IMPRESSION: Lung-RADS 4A, suspicious. Follow up low-dose chest CT without contrast in 3 months (please use the following order, "CT CHEST LCS NODULE FOLLOW-UP W/O CM") is recommended. Alternatively, PET may be considered.  12.4 mm spiculated nodule in the medial right lung apex.  Aortic Atherosclerosis (ICD10-I70.0) and Emphysema (ICD10-J43.9).  These results will be called to the ordering clinician or representative by the Radiologist Assistant, and communication documented in the PACS or zVision Dashboard.   Electronically Signed   By: Julian Hy M.D.   On: 03/07/2017 09:33 NUCLEAR MEDICINE PET SKULL BASE TO THIGH  TECHNIQUE: 9.2 mCi F-18 FDG was injected intravenously. Full-ring PET imaging was performed from the skull base to thigh after the radiotracer. CT data was obtained and used for attenuation correction and anatomic localization.  FASTING BLOOD GLUCOSE:  Value: 89 mg/dl  COMPARISON:  CT chest 03/06/2017  FINDINGS: NECK: No hypermetabolic lymph nodes in the neck.  CHEST: Right paratracheal lymph node measures 1.2 cm and has an SUV max equal to 12.8. No additional hypermetabolic lymph nodes within the chest. The pulmonary nodule within the posteromedial right upper lobe measures 1.2 cm and has an SUV max equal to no additional hypermetabolic pulmonary nodules.  Normal heart size. Aortic atherosclerosis. Calcifications involving the RCA, LAD and left circumflex coronary artery.  ABDOMEN/PELVIS: The liver  appears cirrhotic. The contour the liver is irregular and there is hypertrophy of the caudate and lateral segment of left lobe of liver. Low-attenuation structure within the left lobe of liver measures 11 mm. No corresponding increased uptake identified on the PET images. No focal hypermetabolic lesions identified within the liver. No abnormal uptake within the pancreas or spleen. The adrenal glands are unremarkable. A small volume of ascites is identified within the pelvis.  SKELETON: Focal area of moderate increased radiotracer uptake localizes to the  inferior aspect of the L4 spinous process within SUV max equal to 4.36. Age indeterminate L1 compression deformity is identified.  IMPRESSION: 1. Intense uptake localizes to the right upper lobe pulmonary nodule worrisome for primary bronchogenic carcinoma. There is a hypermetabolic right paratracheal lymph node worrisome for metastatic adenopathy. 2. Small focus of moderate increased uptake localizes to the L4 transverse process. Cannot rule out metastatic disease. 3. Aortic Atherosclerosis (ICD10-I70.0). Three vessel coronary artery calcifications noted. 4. Cirrhosis of the liver.   Electronically Signed   By: Kerby Moors M.D.   On: 03/29/2017 13:59 I personally reviewed the CT and PET/CT images and concur with the findings noted above  Impression: [name] is a 77 year old man with a long history of tobacco abuse and known COPD who had a CT for lung cancer screening done about a month ago.  It showed a spiculated right upper lobe nodule and right paratracheal adenopathy.  On PET CT both of these areas were hypermetabolic.  There also is some hypermetabolic activity in the area of the L4 spinous process but no discrete lesion seen on CT images.  Given his age, smoking history, and appearance of the lesions this is highly suspicious for a new primary bronchogenic carcinoma, at least stage IIIA and possibly stage IV.  He  needs a definitive diagnosis to guide therapy.  I recommended that we proceed with bronchoscopy, endobronchial ultrasound, and possible mediastinoscopy.  I think the paratracheal lymph node will be easier to approach, more likely to yield a diagnosis, and less likely to have complications than trying to biopsy the peripheral apical lung nodule.  I described the proposed procedure to him.  He understands that we would start with bronchoscopy and endobronchial ultrasound.  If that yields a diagnosis mediastinoscopy will not be necessary.  If nondiagnostic we will proceed with mediastinoscopy to get better biopsies of the lymph node.  He understands this would be done in the operating room under general anesthesia.  I described the proposed procedure to him.  He understands the endoscopic portion as well as the possible surgical portion with the incision to be used.  We will plan to do this as an outpatient.  He understands that this is diagnostic and not therapeutic.  I informed him of the indications, risks, benefits, and alternatives.  He understands the risks of bronchoscopy and endobronchial ultrasound include those associated with general anesthesia including very small risk of MI, blood clots, stroke or death.  There is also risk of bleeding or pneumothorax with lymph node sampling.  If mediastinoscopy is necessary the risks include, but not limited to stroke, bleeding, possible need for thoracotomy, recurrent nerve injury with hoarseness, pneumothorax, as well as possibility of other unforeseeable complications.  He accepts the risks and wishes to proceed.  He says that he cannot do it next week because of other things he needs to attend to.  The first day that we can do with the following week is Friday, March 8.  Cirrhosis-history of heavy ethanol abuse.  He says he has had none for 2 years.  No active issues in that regard that he is aware of.  Plan: Bronchoscopy, endobronchial ultrasound,  possible mediastinoscopy on Friday, 05/04/2017  Melrose Nakayama, MD [facility] [phone]

## 2017-04-18 NOTE — Progress Notes (Signed)
PCP is Sharilyn Sites, MD Referring Provider is Higgs, Mathis Dad, MD  Chief Complaint  Patient presents with  . Lung Lesion    Surgical eval, Chest CT 03/06/17, Pet Scan 03/29/17    HPI: [name] is sent for consultation regarding a right upper lobe lung nodule with mediastinal adenopathy.  Ian Aguilar is a 77 year old man with a long-standing history of tobacco abuse, COPD, ethanol abuse (quit 2 years ago), cirrhosis, hyperlipidemia, arthritis, and depression with anxiety.  He recently had a low-dose CT for screening for lung cancer.  It showed a suspicious right upper lobe nodule and also an enlarged right paratracheal lymph node.  A PET/CT was done which showed both of these lesions were hypermetabolic.  There was also some uptake in the inferior aspect of the L4 spinous process with an SUV of 4.36.  [name] says he has been feeling well.  He does get short of breath with exertion.  That is a long-standing problem and has not changed recently.  He has a chronic cough.  He sometimes coughs up mucus but swallows it.  He is not aware of any hemoptysis.  He denies any chest pain, pressure, or tightness with exertion.  He is aware that he has cirrhosis but is not aware of any problems related to it.  He denies any hoarseness, change in appetite, weight loss, unusual headaches or visual changes.  He has chronic right hip pain.  Zubrod Score: At the time of surgery this patient's most appropriate activity status/level should be described as: _0     0    Normal activity, no symptoms _1     1    Restricted in physical strenuous activity but ambulatory, able to do out light work _2     2    Ambulatory and capable of self care, unable to do work activities, up and about >50 % of waking hours                              _3     3    Only limited self care, in bed greater than 50% of waking hours _4     4    Completely disabled, no self care, confined to bed or chair _5     5    Moribund  Past Medical  History:  Diagnosis Date  . Anxiety   . Arthritis   . Cirrhosis ([place])    confirmed by MRI on 07/04/11.  no immunizations  . COPD (chronic obstructive pulmonary disease) ([place])   . Depression with anxiety   . ETOH abuse   . Hyperlipidemia     Past Surgical History:  Procedure Laterality Date  . bilateral cataract surgery     [place]  . broken arm  6 yrs ago   left otif of wrist-[place]  . CLOSED REDUCTION WRIST FRACTURE Right 09/02/2015   Procedure: RIGHT WRIST REDUCTION;  Surgeon: Renette Butters, MD;  Location: [place];  Service: Orthopedics;  Laterality: Right;  with MAC  . COLONOSCOPY  1996   [facility]: external hemorrhoids, no polyps  . COLONOSCOPY  06/28/2011   Dr. Ala Bent diverticulosis,tubular adenoma, hyperplastic polyp  . ESOPHAGOGASTRODUODENOSCOPY  06/28/2011   Dr. Chelsea Aus erosive reflux esophagitis, hiatal hernia-gastritis  . EXTERNAL EAR SURGERY     right ear-cleaned out ear and created new eardrum  . INGUINAL HERNIA REPAIR  2-3 yrs ago   left-[place]_  . INTRAMEDULLARY (IM) NAIL INTERTROCHANTERIC Right 09/02/2015  Procedure: RIGHT  INTERTROCHANTRIC HIP;  Surgeon: Renette Butters, MD;  Location: [place];  Service: Orthopedics;  Laterality: Right;  With MAC  . KNEE SURGERY     left-arthroscopy-[place]  . SKULL FRACTURE ELEVATION     fractured cheeck bone repaired  . TOTAL HIP ARTHROPLASTY  12/18/2011   Procedure: TOTAL HIP ARTHROPLASTY;  Surgeon: Carole Civil, MD;  Location: [facility];  Service: Orthopedics;  Laterality: Left;  . WISDOM TOOTH EXTRACTION      Family History  Problem Relation Age of Onset  . Stroke Mother 87  . Heart disease Father 39       MI  . Diabetes Maternal Uncle   . Colon cancer Neg Hx   . Cancer Neg Hx     Social History Social History   Tobacco Use  . Smoking status: Current Every Day Smoker    Packs/day: 0.25    Years: 62.00    Pack years: 15.50    Types: Cigarettes  . Smokeless tobacco: Never Used  . Tobacco  comment: 1/2 ppd > 60 years  Substance Use Topics  . Alcohol use: Yes    Comment: history of ETOH abuse, stopped 1.5 years ago (had one drink at [place] [employer]).   . Drug use: No    Current Outpatient Medications  Medication Sig Dispense Refill  . cholecalciferol (VITAMIN D) 1000 units tablet Take 1,000 Units by mouth daily.    Marland Kitchen docusate sodium (COLACE) 100 MG capsule Take 1 capsule (100 mg total) by mouth 2 (two) times daily. (Patient taking differently: Take 100 mg by mouth 2 (two) times daily as needed (for constipation (no bowel movement in 72 hours)). ) 60 capsule 0  . Nutritional Supplements (ENSURE ENLIVE PO) Take 237 mLs by mouth daily as needed (for poor intake).     Marland Kitchen PROAIR HFA 108 (90 Base) MCG/ACT inhaler Inhale 2 puffs into the lungs every 6 (six) hours as needed (for shortness of breath/wheezing/cough).   2  . Propylene Glycol-Glycerin (SOOTHE) 0.6-0.6 % SOLN Place 1-2 drops into both eyes 3 (three) times daily as needed (for dry/irritated eyes.).    Marland Kitchen Skin Protectants, Misc. (EUCERIN) cream Apply 1 application topically 4 (four) times daily as needed for dry skin (for itchy or dry skin).    . Tiotropium Bromide Monohydrate (SPIRIVA RESPIMAT) 2.5 MCG/ACT AERS Inhale 1 puff into the lungs every evening.     No current facility-administered medications for this visit.     No Known Allergies  Review of Systems  Constitutional: Negative for activity change, chills, fever and unexpected weight change.  HENT: Negative for trouble swallowing and voice change.   Eyes: Negative for visual disturbance.  Respiratory: Positive for cough and shortness of breath. Negative for wheezing.   Cardiovascular: Negative for chest pain.  Gastrointestinal: Negative for abdominal pain and blood in stool.  Genitourinary: Negative for difficulty urinating and dysuria.  Musculoskeletal: Positive for arthralgias and gait problem (Uses cane when in unfamiliar area). Negative for myalgias.   Neurological: Negative for headaches.       Balance problem with history of falls, none recently  Hematological: Negative for adenopathy. Does not bruise/bleed easily.  Psychiatric/Behavioral: Positive for dysphoric mood. The patient is nervous/anxious.   All other systems reviewed and are negative.   BP (!) 180/83   Pulse 72   Resp 20   Ht _0  (1.753 m)   Wt 178 lb (80.7 kg)   SpO2 95% Comment: RA  BMI 26.29 kg/m  Physical Exam  Constitutional: He is oriented to person, place, and time. He appears well-developed and well-nourished. No distress.  HENT:  Head: Normocephalic and atraumatic.  Mouth/Throat: No oropharyngeal exudate.  Eyes: Conjunctivae and EOM are normal. No scleral icterus.  Neck: No thyromegaly present.  Cardiovascular: Normal rate, regular rhythm and normal heart sounds.  No murmur heard. Pulmonary/Chest: Effort normal. No respiratory distress. He has no wheezes. He has no rales.  Diminished breath sounds bilaterally  Abdominal: Soft. He exhibits no distension. There is no tenderness.  Musculoskeletal: He exhibits deformity (Right leg shorter,).  Lymphadenopathy:    He has no cervical adenopathy.  Neurological: He is alert and oriented to person, place, and time. No cranial nerve deficit.  Good strength in all 4 extremities  Skin: Skin is warm and dry.  Vitals reviewed.    Diagnostic Tests: CT CHEST WITHOUT CONTRAST LOW-DOSE FOR LUNG CANCER SCREENING  TECHNIQUE: Multidetector CT imaging of the chest was performed following the standard protocol without IV contrast.  COMPARISON:  None.  FINDINGS: Cardiovascular: The heart is normal in size. No pericardial effusion.  No evidence of thoracic aortic aneurysm. Atherosclerotic calcifications of the aortic arch.  Three vessel coronary atherosclerosis.  Mediastinum/Nodes: Right paratracheal nodes measuring 12-13 mm short axis (series 2/images 20 and 22).  Visualized thyroid is  unremarkable.  Lungs/Pleura: 12.4 mm spiculated nodule in the medial right lung apex, suspicious.  Mild centrilobular emphysematous changes, upper lobe predominant.  Linear scarring in the lingula.  Mild biapical pleural-parenchymal scarring.  No focal consolidation.  No pleural effusion or pneumothorax.  Upper Abdomen: Visualized upper abdomen is grossly unremarkable.  Musculoskeletal: Mild anterior wedging at L1.  IMPRESSION: Lung-RADS 4A, suspicious. Follow up low-dose chest CT without contrast in 3 months (please use the following order, "CT CHEST LCS NODULE FOLLOW-UP W/O CM") is recommended. Alternatively, PET may be considered.  12.4 mm spiculated nodule in the medial right lung apex.  Aortic Atherosclerosis (ICD10-I70.0) and Emphysema (ICD10-J43.9).  These results will be called to the ordering clinician or representative by the Radiologist Assistant, and communication documented in the PACS or zVision Dashboard.   Electronically Signed   By: Julian Hy M.D.   On: 03/07/2017 09:33 NUCLEAR MEDICINE PET SKULL BASE TO THIGH  TECHNIQUE: 9.2 mCi F-18 FDG was injected intravenously. Full-ring PET imaging was performed from the skull base to thigh after the radiotracer. CT data was obtained and used for attenuation correction and anatomic localization.  FASTING BLOOD GLUCOSE:  Value: 89 mg/dl  COMPARISON:  CT chest 03/06/2017  FINDINGS: NECK: No hypermetabolic lymph nodes in the neck.  CHEST: Right paratracheal lymph node measures 1.2 cm and has an SUV max equal to 12.8. No additional hypermetabolic lymph nodes within the chest. The pulmonary nodule within the posteromedial right upper lobe measures 1.2 cm and has an SUV max equal to no additional hypermetabolic pulmonary nodules.  Normal heart size. Aortic atherosclerosis. Calcifications involving the RCA, LAD and left circumflex coronary artery.  ABDOMEN/PELVIS: The liver  appears cirrhotic. The contour the liver is irregular and there is hypertrophy of the caudate and lateral segment of left lobe of liver. Low-attenuation structure within the left lobe of liver measures 11 mm. No corresponding increased uptake identified on the PET images. No focal hypermetabolic lesions identified within the liver. No abnormal uptake within the pancreas or spleen. The adrenal glands are unremarkable. A small volume of ascites is identified within the pelvis.  SKELETON: Focal area of moderate increased radiotracer uptake localizes to the  inferior aspect of the L4 spinous process within SUV max equal to 4.36. Age indeterminate L1 compression deformity is identified.  IMPRESSION: 1. Intense uptake localizes to the right upper lobe pulmonary nodule worrisome for primary bronchogenic carcinoma. There is a hypermetabolic right paratracheal lymph node worrisome for metastatic adenopathy. 2. Small focus of moderate increased uptake localizes to the L4 transverse process. Cannot rule out metastatic disease. 3. Aortic Atherosclerosis (ICD10-I70.0). Three vessel coronary artery calcifications noted. 4. Cirrhosis of the liver.   Electronically Signed   By: Kerby Moors M.D.   On: 03/29/2017 13:59 I personally reviewed the CT and PET/CT images and concur with the findings noted above  Impression: [name] is a 77 year old man with a long history of tobacco abuse and known COPD who had a CT for lung cancer screening done about a month ago.  It showed a spiculated right upper lobe nodule and right paratracheal adenopathy.  On PET CT both of these areas were hypermetabolic.  There also is some hypermetabolic activity in the area of the L4 spinous process but no discrete lesion seen on CT images.  Given his age, smoking history, and appearance of the lesions this is highly suspicious for a new primary bronchogenic carcinoma, at least stage IIIA and possibly stage IV.  He  needs a definitive diagnosis to guide therapy.  I recommended that we proceed with bronchoscopy, endobronchial ultrasound, and possible mediastinoscopy.  I think the paratracheal lymph node will be easier to approach, more likely to yield a diagnosis, and less likely to have complications than trying to biopsy the peripheral apical lung nodule.  I described the proposed procedure to him.  He understands that we would start with bronchoscopy and endobronchial ultrasound.  If that yields a diagnosis mediastinoscopy will not be necessary.  If nondiagnostic we will proceed with mediastinoscopy to get better biopsies of the lymph node.  He understands this would be done in the operating room under general anesthesia.  I described the proposed procedure to him.  He understands the endoscopic portion as well as the possible surgical portion with the incision to be used.  We will plan to do this as an outpatient.  He understands that this is diagnostic and not therapeutic.  I informed him of the indications, risks, benefits, and alternatives.  He understands the risks of bronchoscopy and endobronchial ultrasound include those associated with general anesthesia including very small risk of MI, blood clots, stroke or death.  There is also risk of bleeding or pneumothorax with lymph node sampling.  If mediastinoscopy is necessary the risks include, but not limited to stroke, bleeding, possible need for thoracotomy, recurrent nerve injury with hoarseness, pneumothorax, as well as possibility of other unforeseeable complications.  He accepts the risks and wishes to proceed.  He says that he cannot do it next week because of other things he needs to attend to.  The first day that we can do with the following week is Friday, March 8.  Cirrhosis-history of heavy ethanol abuse.  He says he has had none for 2 years.  No active issues in that regard that he is aware of.  Plan: Bronchoscopy, endobronchial ultrasound,  possible mediastinoscopy on Friday, 05/04/2017  Melrose Nakayama, MD [facility] [phone]

## 2017-04-19 ENCOUNTER — Other Ambulatory Visit: Payer: Self-pay | Admitting: *Deleted

## 2017-04-19 DIAGNOSIS — R911 Solitary pulmonary nodule: Secondary | ICD-10-CM

## 2017-04-19 NOTE — Patient Instructions (Signed)
Ian Aguilar  04/19/2017     @PREFPERIOPPHARMACY @   Your procedure is scheduled on  04/26/2017   Report to [facility] at  1215  P.M.  Call this number if you have problems the morning of surgery:  [phone]   Remember:  Do not eat food or drink liquids after midnight.  Take these medicines the morning of surgery with A SIP OF WATER  None. Use your inhalers before you come.   Do not wear jewelry, make-up or nail polish.  Do not wear lotions, powders, or perfumes, or deodorant.  Do not shave 48 hours prior to surgery.  Men may shave face and neck.  Do not bring valuables to the hospital.  [facility] is not responsible for any belongings or valuables.  Contacts, dentures or bridgework may not be worn into surgery.  Leave your suitcase in the car.  After surgery it may be brought to your room.  For patients admitted to the hospital, discharge time will be determined by your treatment team.  Patients discharged the day of surgery will not be allowed to drive home.   Name and phone number of your driver:   family Special instructions:  Follow the diet and prep instructions given to you by Dr Roseanne Kaufman office.  Please read over the following fact sheets that you were given. Anesthesia Post-op Instructions and Care and Recovery After Surgery       Colonoscopy, Adult A colonoscopy is an exam to look at the large intestine. It is done to check for problems, such as:  Lumps (tumors).  Growths (polyps).  Swelling (inflammation).  Bleeding.  What happens before the procedure? Eating and drinking Follow instructions from your doctor about eating and drinking. These instructions may include:  A few days before the procedure - follow a low-fiber diet. ? Avoid nuts. ? Avoid seeds. ? Avoid dried fruit. ? Avoid raw fruits. ? Avoid vegetables.  1-3 days before the procedure - follow a clear liquid diet. Avoid liquids that have red or purple dye. Drink only  clear liquids, such as: ? Clear broth or bouillon. ? Black coffee or tea. ? Clear juice. ? Clear soft drinks or sports drinks. ? Gelatin dessert. ? Popsicles.  On the day of the procedure - do not eat or drink anything during the 2 hours before the procedure.  Bowel prep If you were prescribed an oral bowel prep:  Take it as told by your doctor. Starting the day before your procedure, you will need to drink a lot of liquid. The liquid will cause you to poop (have bowel movements) until your poop is almost clear or light green.  If your skin or butt gets irritated from diarrhea, you may: ? Wipe the area with wipes that have medicine in them, such as adult wet wipes with aloe and vitamin E. ? Put something on your skin that soothes the area, such as petroleum jelly.  If you throw up (vomit) while drinking the bowel prep, take a break for up to 60 minutes. Then begin the bowel prep again. If you keep throwing up and you cannot take the bowel prep without throwing up, call your doctor.  General instructions  Ask your doctor about changing or stopping your normal medicines. This is important if you take diabetes medicines or blood thinners.  Plan to have someone take you home from the hospital or clinic. What happens during the procedure?  An IV  tube may be put into one of your veins.  You will be given medicine to help you relax (sedative).  To reduce your risk of infection: ? Your doctors will wash their hands. ? Your anal area will be washed with soap.  You will be asked to lie on your side with your knees bent.  Your doctor will get a long, thin, flexible tube ready. The tube will have a camera and a light on the end.  The tube will be put into your anus.  The tube will be gently put into your large intestine.  Air will be delivered into your large intestine to keep it open. You may feel some pressure or cramping.  The camera will be used to take photos.  A small tissue  sample may be removed from your body to be looked at under a microscope (biopsy). If any possible problems are found, the tissue will be sent to a lab for testing.  If small growths are found, your doctor may remove them and have them checked for cancer.  The tube that was put into your anus will be slowly removed. The procedure may vary among doctors and hospitals. What happens after the procedure?  Your doctor will check on you often until the medicines you were given have worn off.  Do not drive for 24 hours after the procedure.  You may have a small amount of blood in your poop.  You may pass gas.  You may have mild cramps or bloating in your belly (abdomen).  It is up to you to get the results of your procedure. Ask your doctor, or the department performing the procedure, when your results will be ready. This information is not intended to replace advice given to you by your health care provider. Make sure you discuss any questions you have with your health care provider. Document Released: 03/18/2010 Document Revised: 12/15/2015 Document Reviewed: 04/27/2015 Elsevier Interactive Patient Education  2017 [employer]vier Inc[employer]  Colonoscopy, Adult, Care After This sheet gives you information about how to care for yourself after your procedure. Your health care provider may also give you more specific instructions. If you have problems or questions, contact your health care provider. What can I expect after the procedure? After the procedure, it is common to have:  A small amount of blood in your stool for 24 hours after the procedure.  Some gas.  Mild abdominal cramping or bloating.  Follow these instructions at home: General instructions   For the first 24 hours after the procedure: ? Do not drive or use machinery. ? Do not sign important documents. ? Do not drink alcohol. ? Do your regular daily activities at a slower pace than normal. ? Eat soft, easy-to-digest foods. ? Rest  often.  Take over-the-counter or prescription medicines only as told by your health care provider.  It is up to you to get the results of your procedure. Ask your health care provider, or the department performing the procedure, when your results will be ready. Relieving cramping and bloating  Try walking around when you have cramps or feel bloated.  Apply heat to your abdomen as told by your health care provider. Use a heat source that your health care provider recommends, such as a moist heat pack or a heating pad. ? Place a towel between your skin and the heat source. ? Leave the heat on for 20-30 minutes. ? Remove the heat if your skin turns bright red. This is especially important  if you are unable to feel pain, heat, or cold. You may have a greater risk of getting burned. Eating and drinking  Drink enough fluid to keep your urine clear or pale yellow.  Resume your normal diet as instructed by your health care provider. Avoid heavy or fried foods that are hard to digest.  Avoid drinking alcohol for as long as instructed by your health care provider. Contact a health care provider if:  You have blood in your stool 2-3 days after the procedure. Get help right away if:  You have more than a small spotting of blood in your stool.  You pass large blood clots in your stool.  Your abdomen is swollen.  You have nausea or vomiting.  You have a fever.  You have increasing abdominal pain that is not relieved with medicine. This information is not intended to replace advice given to you by your health care provider. Make sure you discuss any questions you have with your health care provider. Document Released: 09/28/2003 Document Revised: 11/08/2015 Document Reviewed: 04/27/2015 Elsevier Interactive Patient Education  2018 [place] Anesthesia is a term that refers to techniques, procedures, and medicines that help a person stay safe and comfortable  during a medical procedure. Monitored anesthesia care, or sedation, is one type of anesthesia. Your anesthesia specialist may recommend sedation if you will be having a procedure that does not require you to be unconscious, such as:  Cataract surgery.  A dental procedure.  A biopsy.  A colonoscopy.  During the procedure, you may receive a medicine to help you relax (sedative). There are three levels of sedation:  Mild sedation. At this level, you may feel awake and relaxed. You will be able to follow directions.  Moderate sedation. At this level, you will be sleepy. You may not remember the procedure.  Deep sedation. At this level, you will be asleep. You will not remember the procedure.  The more medicine you are given, the deeper your level of sedation will be. Depending on how you respond to the procedure, the anesthesia specialist may change your level of sedation or the type of anesthesia to fit your needs. An anesthesia specialist will monitor you closely during the procedure. Let your health care provider know about:  Any allergies you have.  All medicines you are taking, including vitamins, herbs, eye drops, creams, and over-the-counter medicines.  Any use of steroids (by mouth or as a cream).  Any problems you or family members have had with sedatives and anesthetic medicines.  Any blood disorders you have.  Any surgeries you have had.  Any medical conditions you have, such as sleep apnea.  Whether you are pregnant or may be pregnant.  Any use of cigarettes, alcohol, or street drugs. What are the risks? Generally, this is a safe procedure. However, problems may occur, including:  Getting too much medicine (oversedation).  Nausea.  Allergic reaction to medicines.  Trouble breathing. If this happens, a breathing tube may be used to help with breathing. It will be removed when you are awake and breathing on your own.  Heart trouble.  Lung trouble.  Before  the procedure Staying hydrated Follow instructions from your health care provider about hydration, which may include:  Up to 2 hours before the procedure - you may continue to drink clear liquids, such as water, clear fruit juice, black coffee, and plain tea.  Eating and drinking restrictions Follow instructions from your health care provider about eating  and drinking, which may include:  8 hours before the procedure - stop eating heavy meals or foods such as meat, fried foods, or fatty foods.  6 hours before the procedure - stop eating light meals or foods, such as toast or cereal.  6 hours before the procedure - stop drinking milk or drinks that contain milk.  2 hours before the procedure - stop drinking clear liquids.  Medicines Ask your health care provider about:  Changing or stopping your regular medicines. This is especially important if you are taking diabetes medicines or blood thinners.  Taking medicines such as aspirin and ibuprofen. These medicines can thin your blood. Do not take these medicines before your procedure if your health care provider instructs you not to.  Tests and exams  You will have a physical exam.  You may have blood tests done to show: ? How well your kidneys and liver are working. ? How well your blood can clot.  General instructions  Plan to have someone take you home from the hospital or clinic.  If you will be going home right after the procedure, plan to have someone with you for 24 hours.  What happens during the procedure?  Your blood pressure, heart rate, breathing, level of pain and overall condition will be monitored.  An IV tube will be inserted into one of your veins.  Your anesthesia specialist will give you medicines as needed to keep you comfortable during the procedure. This may mean changing the level of sedation.  The procedure will be performed. After the procedure  Your blood pressure, heart rate, breathing rate, and  blood oxygen level will be monitored until the medicines you were given have worn off.  Do not drive for 24 hours if you received a sedative.  You may: ? Feel sleepy, clumsy, or nauseous. ? Feel forgetful about what happened after the procedure. ? Have a sore throat if you had a breathing tube during the procedure. ? Vomit. This information is not intended to replace advice given to you by your health care provider. Make sure you discuss any questions you have with your health care provider. Document Released: 11/09/2004 Document Revised: 07/23/2015 Document Reviewed: 06/06/2015 Elsevier Interactive Patient Education  2018 [place], Care After These instructions provide you with information about caring for yourself after your procedure. Your health care provider may also give you more specific instructions. Your treatment has been planned according to current medical practices, but problems sometimes occur. Call your health care provider if you have any problems or questions after your procedure. What can I expect after the procedure? After your procedure, it is common to:  Feel sleepy for several hours.  Feel clumsy and have poor balance for several hours.  Feel forgetful about what happened after the procedure.  Have poor judgment for several hours.  Feel nauseous or vomit.  Have a sore throat if you had a breathing tube during the procedure.  Follow these instructions at home: For at least 24 hours after the procedure:   Do not: ? Participate in activities in which you could fall or become injured. ? Drive. ? Use heavy machinery. ? Drink alcohol. ? Take sleeping pills or medicines that cause drowsiness. ? Make important decisions or sign legal documents. ? Take care of children on your own.  Rest. Eating and drinking  Follow the diet that is recommended by your health care provider.  If you vomit, drink water, juice, or soup when  you  can drink without vomiting.  Make sure you have little or no nausea before eating solid foods. General instructions  Have a responsible adult stay with you until you are awake and alert.  Take over-the-counter and prescription medicines only as told by your health care provider.  If you smoke, do not smoke without supervision.  Keep all follow-up visits as told by your health care provider. This is important. Contact a health care provider if:  You keep feeling nauseous or you keep vomiting.  You feel light-headed.  You develop a rash.  You have a fever. Get help right away if:  You have trouble breathing. This information is not intended to replace advice given to you by your health care provider. Make sure you discuss any questions you have with your health care provider. Document Released: 06/06/2015 Document Revised: 10/06/2015 Document Reviewed: 06/06/2015 Elsevier Interactive Patient Education  [employer].

## 2017-04-23 ENCOUNTER — Other Ambulatory Visit: Payer: Self-pay

## 2017-04-23 ENCOUNTER — Encounter (HOSPITAL_COMMUNITY): Payer: Self-pay

## 2017-04-23 ENCOUNTER — Encounter (HOSPITAL_COMMUNITY)
Admission: RE | Admit: 2017-04-23 | Discharge: 2017-04-23 | Disposition: A | Discharge status: Home / Self Care | Payer: PPO | Source: Ambulatory Visit | Attending: Internal Medicine | Admitting: Internal Medicine

## 2017-04-23 DIAGNOSIS — K7460 Unspecified cirrhosis of liver: Secondary | ICD-10-CM | POA: Diagnosis not present

## 2017-04-23 DIAGNOSIS — Z8249 Family history of ischemic heart disease and other diseases of the circulatory system: Secondary | ICD-10-CM | POA: Diagnosis not present

## 2017-04-23 DIAGNOSIS — Z96642 Presence of left artificial hip joint: Secondary | ICD-10-CM | POA: Diagnosis not present

## 2017-04-23 DIAGNOSIS — M1990 Unspecified osteoarthritis, unspecified site: Secondary | ICD-10-CM | POA: Diagnosis not present

## 2017-04-23 DIAGNOSIS — R911 Solitary pulmonary nodule: Secondary | ICD-10-CM | POA: Diagnosis not present

## 2017-04-23 DIAGNOSIS — E785 Hyperlipidemia, unspecified: Secondary | ICD-10-CM | POA: Diagnosis not present

## 2017-04-23 DIAGNOSIS — K766 Portal hypertension: Secondary | ICD-10-CM | POA: Diagnosis not present

## 2017-04-23 DIAGNOSIS — J449 Chronic obstructive pulmonary disease, unspecified: Secondary | ICD-10-CM | POA: Diagnosis not present

## 2017-04-23 DIAGNOSIS — Z79899 Other long term (current) drug therapy: Secondary | ICD-10-CM | POA: Diagnosis not present

## 2017-04-23 DIAGNOSIS — K31819 Angiodysplasia of stomach and duodenum without bleeding: Secondary | ICD-10-CM | POA: Diagnosis not present

## 2017-04-23 DIAGNOSIS — F17210 Nicotine dependence, cigarettes, uncomplicated: Secondary | ICD-10-CM | POA: Diagnosis not present

## 2017-04-23 DIAGNOSIS — Z1211 Encounter for screening for malignant neoplasm of colon: Secondary | ICD-10-CM | POA: Diagnosis not present

## 2017-04-23 DIAGNOSIS — I10 Essential (primary) hypertension: Secondary | ICD-10-CM | POA: Diagnosis not present

## 2017-04-23 DIAGNOSIS — K449 Diaphragmatic hernia without obstruction or gangrene: Secondary | ICD-10-CM | POA: Diagnosis not present

## 2017-04-23 DIAGNOSIS — Z86010 Personal history of colonic polyps: Secondary | ICD-10-CM | POA: Diagnosis not present

## 2017-04-23 DIAGNOSIS — F418 Other specified anxiety disorders: Secondary | ICD-10-CM | POA: Diagnosis not present

## 2017-04-23 LAB — BASIC METABOLIC PANEL
Anion gap: 11 (ref 5–15)
BUN: 17 mg/dL (ref 6–20)
CO2: 27 mmol/L (ref 22–32)
Calcium: 9.3 mg/dL (ref 8.9–10.3)
Chloride: 101 mmol/L (ref 101–111)
Creatinine, Ser: 0.87 mg/dL (ref 0.61–1.24)
GFR calc Af Amer: >60 mL/min (ref >60)
GFR calc non Af Amer: >60 mL/min (ref >60)
Glucose, Bld: 81 mg/dL (ref 65–99)
Potassium: 3.8 mmol/L (ref 3.5–5.1)
Sodium: 139 mmol/L (ref 135–145)

## 2017-04-23 LAB — CBC WITH DIFFERENTIAL/PLATELET
Basophils Absolute: 0 10*3/uL (ref 0.0–0.1)
Basophils Relative: 0 %
Eosinophils Absolute: 0.1 10*3/uL (ref 0.0–0.7)
Eosinophils Relative: 2 %
HCT: 46.3 % (ref 39.0–52.0)
Hemoglobin: 14.7 g/dL (ref 13.0–17.0)
Lymphocytes Relative: 21 %
Lymphs Abs: 1.2 10*3/uL (ref 0.7–4.0)
MCH: 31.2 pg (ref 26.0–34.0)
MCHC: 31.7 g/dL (ref 30.0–36.0)
MCV: 98.3 fL (ref 78.0–100.0)
Monocytes Absolute: 0.3 10*3/uL (ref 0.1–1.0)
Monocytes Relative: 6 %
Neutro Abs: 3.9 10*3/uL (ref 1.7–7.7)
Neutrophils Relative %: 71 %
Platelets: 219 10*3/uL (ref 150–400)
RBC: 4.71 MIL/uL (ref 4.22–5.81)
RDW: 15 % (ref 11.5–15.5)
WBC: 5.4 10*3/uL (ref 4.0–10.5)

## 2017-04-23 NOTE — Progress Notes (Signed)
Diagnosis No diagnosis found.  Staging Cancer Staging No matching staging information was found for the patient.  Assessment and Plan:  1.  Pulmonary lesion:  77 year old man with a long-standing history of tobacco abuse, COPD, ethanol abuse (quit 2 years ago), cirrhosis, hyperlipidemia, arthritis, and depression with anxiety.  He recently had a low-dose CT for screening for lung cancer.  It showed a suspicious right upper lobe nodule and also an enlarged right paratracheal lymph node.  A PET/CT was done which showed both of these lesions were hypermetabolic.  There was also some uptake in the inferior aspect of the L4 spinous process with an SUV of 4.36.  Ian Aguilar says he has been feeling well.  He reports occasional SOB and chronic cough.  He denies any hoarseness, change in appetite, weight loss, unusual headaches or visual changes.  He has chronic right hip pain.  Long talk held with pt.  He will be referred to Dr. Roxan Hockey for evaluation for biopsy for definitive diagnosis.  He will RTC for treatment options once diagnosis has been confirmed.    2.  Smoking.  Cessation is recommended.    3.  Cirrhosis.  Followup with GI as recommended.    4.  Hip pain.  He has undergone Hip surgery in the past.  L4 lesion noted on PET scan but no hip lesions noted.    5.  Health maintenance.  He has undergone colonoscopy in the past.    6.  Hypertension. BP is 180/83.  Follow-up with PCP.     HPI:  77 year old man with a long-standing history of tobacco abuse, COPD, ethanol abuse (quit 2 years ago), cirrhosis, hyperlipidemia, arthritis, and depression with anxiety.  He recently had a low-dose CT for screening for lung cancer.  It showed a suspicious right upper lobe nodule and also an enlarged right paratracheal lymph node.  A PET/CT was done which showed both of these lesions were hypermetabolic.  There was also some uptake in the inferior aspect of the L4 spinous process with an SUV of  4.36.  Ian Aguilar says he has been feeling well.  He reports occasional SOB and chronic cough.  He denies any hoarseness, change in appetite, weight loss, unusual headaches or visual changes.  He has chronic right hip pain.  He has not undergone biopsy for diagnosis.  Pt is seen today for consultation due to abnormal PET scan.     Problem List Patient Active Problem List   Diagnosis Date Noted  . Hepatic cirrhosis ([place]) [K74.60] 04/06/2017  . History of colonic polyps [Z86.010] 04/06/2017  . History of snoring [Z87.898] 09/07/2015  . Fracture of ulnar styloid [S52.613A]   . Benign essential HTN [I10]   . Faintness [R55]   . Thrombocytopenia ([place]) [D69.6] 09/03/2015  . Hyponatremia [E87.1] 09/03/2015  . Closed displaced intertrochanteric fracture of right femur ([place]) [S72.141A]   . Distal radius fracture, right [S52.501A] 09/02/2015  . Fracture of right hip requiring operative repair ([place]) [S72.001A] 09/01/2015  . Alcohol abuse [F10.10] 09/01/2015  . Tobacco abuse [Z72.0] 09/01/2015  . COPD (chronic obstructive pulmonary disease) [J44.9] 09/01/2015  . S/P hip replacement [Z96.649] 01/15/2012  . Weakness of left leg [R29.898] 01/10/2012  . Difficulty in walking(719.7) [R26.2] 01/10/2012  . Pain in left hip [M25.552] 01/10/2012  . Acute blood loss anemia [D62] 12/21/2011  . Cirrhosis- imaging diagnosis 2013 [K74.60] 09/08/2011  . Umbilical hernia [U93.2] 09/08/2011  . OA (osteoarthritis) of hip [M16.9] 09/01/2011  . Hepatomegaly [R16.0] 06/14/2011  .  Encounter for screening colonoscopy 06/14/2011  . JOINT EFFUSION, KNEE [M25.469] 02/10/2008  . KNEE PAIN [M25.569] 02/10/2008    Past Medical History Past Medical History:  Diagnosis Date  . Anxiety   . Arthritis   . Cirrhosis ([place])    confirmed by MRI on 07/04/11.  no immunizations  . COPD (chronic obstructive pulmonary disease) ([place])   . Depression with anxiety   . ETOH abuse   . Hyperlipidemia     Past Surgical  History Past Surgical History:  Procedure Laterality Date  . bilateral cataract surgery     [place]  . broken arm  6 yrs ago   left otif of wrist-[place]  . CLOSED REDUCTION WRIST FRACTURE Right 09/02/2015   Procedure: RIGHT WRIST REDUCTION;  Surgeon: Renette Butters, MD;  Location: [facility];  Service: Orthopedics;  Laterality: Right;  with MAC  . COLONOSCOPY  1996   [place]: external hemorrhoids, no polyps  . COLONOSCOPY  06/28/2011   Dr. Ala Bent diverticulosis,tubular adenoma, hyperplastic polyp  . ESOPHAGOGASTRODUODENOSCOPY  06/28/2011   Dr. Chelsea Aus erosive reflux esophagitis, hiatal hernia-gastritis  . EXTERNAL EAR SURGERY     right ear-cleaned out ear and created new eardrum  . INGUINAL HERNIA REPAIR  2-3 yrs ago   left-[place]  . INTRAMEDULLARY (IM) NAIL INTERTROCHANTERIC Right 09/02/2015   Procedure: RIGHT  INTERTROCHANTRIC HIP;  Surgeon: Renette Butters, MD;  Location: [facility];  Service: Orthopedics;  Laterality: Right;  With MAC  . KNEE SURGERY     left-arthroscopy-[place]  . SKULL FRACTURE ELEVATION     fractured cheeck bone repaired  . TOTAL HIP ARTHROPLASTY  12/18/2011   Procedure: TOTAL HIP ARTHROPLASTY;  Surgeon: Carole Civil, MD;  Location: [facility];  Service: Orthopedics;  Laterality: Left;  . WISDOM TOOTH EXTRACTION      Family History Family History  Problem Relation Age of Onset  . Stroke Mother 44  . Heart disease Father 48       MI  . Diabetes Maternal Uncle   . Colon cancer Neg Hx   . Cancer Neg Hx      Social History  reports that he has been smoking cigarettes.  He has a 15.50 pack-year smoking history. he has never used smokeless tobacco. He reports that he drinks alcohol. He reports that he does not use drugs.  Medications  Current Outpatient Medications:  .  docusate sodium (COLACE) 100 MG capsule, Take 1 capsule (100 mg total) by mouth 2 (two) times daily. (Patient taking differently: Take 100 mg by mouth 2 (two) times daily as  needed (for constipation (no bowel movement in 72 hours)). ), Disp: 60 capsule, Rfl: 0 .  Nutritional Supplements (ENSURE ENLIVE PO), Take 237 mLs by mouth daily as needed (for poor intake). , Disp: , Rfl:  .  PROAIR HFA 108 (90 Base) MCG/ACT inhaler, Inhale 2 puffs into the lungs every 6 (six) hours as needed (for shortness of breath/wheezing/cough). , Disp: , Rfl: 2 .  cholecalciferol (VITAMIN D) 1000 units tablet, Take 1,000 Units by mouth daily., Disp: , Rfl:  .  Propylene Glycol-Glycerin (SOOTHE) 0.6-0.6 % SOLN, Place 1-2 drops into both eyes 3 (three) times daily as needed (for dry/irritated eyes.)., Disp: , Rfl:  .  Skin Protectants, Misc. (EUCERIN) cream, Apply 1 application topically 4 (four) times daily as needed for dry skin (for itchy or dry skin)., Disp: , Rfl:  .  Tiotropium Bromide Monohydrate (SPIRIVA RESPIMAT) 2.5 MCG/ACT AERS, Inhale 1 puff into the lungs every  evening., Disp: , Rfl:   Allergies Patient has no known allergies.  Review of Systems Review of Systems - Oncology ROS as per HPI otherwise 12 point ROS is negative other than cough and hip pain.     Physical Exam  Vitals Wt Readings from Last 3 Encounters:  04/18/17 178 lb (80.7 kg)  04/09/17 178 lb 6.4 oz (80.9 kg)  04/06/17 178 lb 3.2 oz (80.8 kg)   Temp Readings from Last 3 Encounters:  04/09/17 98.6 F (37 C) (Oral)  04/06/17 97.7 F (36.5 C) (Oral)  10/11/15 97.6 F (36.4 C) (Oral)   BP Readings from Last 3 Encounters:  04/18/17 (!) 180/83  04/09/17 (!) 158/77  04/06/17 (!) 172/88   Pulse Readings from Last 3 Encounters:  04/18/17 72  04/09/17 76  04/06/17 96   Constitutional: Well-developed, well-nourished, and in no distress.   HENT: Head: Normocephalic and atraumatic.  Mouth/Throat: No oropharyngeal exudate. Mucosa moist. Eyes: Pupils are equal, round, and reactive to light. Conjunctivae are normal. No scleral icterus.  Neck: Normal range of motion. Neck supple. No JVD present.   Cardiovascular: Normal rate, regular rhythm and normal heart sounds.  Exam reveals no gallop and no friction rub.   No murmur heard. Pulmonary/Chest: Effort normal and breath sounds normal. No respiratory distress. No wheezes.No rales.  Abdominal: Soft. Bowel sounds are normal. No distension. There is no tenderness. There is no guarding.  Musculoskeletal: No edema or tenderness.  Lymphadenopathy: No cervical, axillary or supraclavicular adenopathy.  Neurological: Alert and oriented to person, place, and time. No cranial nerve deficit.  Skin: Skin is warm and dry. No rash noted. No erythema. No pallor.  Psychiatric: Affect and judgment normal.   Labs No visits with results within 3 Day(s) from this visit.  Latest known visit with results is:  Hospital Outpatient Visit on 03/29/2017  Component Date Value Ref Range Status  . Glucose-Capillary 03/29/2017 89  65 - 99 mg/dL Final    Pet scan done 03/29/2017:  IMPRESSION: 1. Intense uptake localizes to the right upper lobe pulmonary nodule worrisome for primary bronchogenic carcinoma. There is a hypermetabolic right paratracheal lymph node worrisome for metastatic adenopathy. 2. Small focus of moderate increased uptake localizes to the L4 transverse process. Cannot rule out metastatic disease. 3. Aortic Atherosclerosis (ICD10-I70.0). Three vessel coronary artery calcifications noted. 4. Cirrhosis of the liver.   Zoila Shutter MD

## 2017-04-24 LAB — Acid-Fast Culture and Stain, Respiratory: ACID FAST CULTURE: NEGATIVE

## 2017-04-26 ENCOUNTER — Encounter (HOSPITAL_COMMUNITY)
Admission: RE | Disposition: A | Discharge status: Home / Self Care | Payer: Self-pay | Source: Ambulatory Visit | Attending: Internal Medicine

## 2017-04-26 ENCOUNTER — Other Ambulatory Visit: Payer: Self-pay

## 2017-04-26 ENCOUNTER — Ambulatory Visit (HOSPITAL_COMMUNITY): Payer: PPO | Admitting: Anesthesiology

## 2017-04-26 ENCOUNTER — Ambulatory Visit (HOSPITAL_COMMUNITY)
Admission: RE | Admit: 2017-04-26 | Discharge: 2017-04-26 | Disposition: A | Discharge status: Home / Self Care | Payer: PPO | Source: Ambulatory Visit | Attending: Internal Medicine | Admitting: Internal Medicine

## 2017-04-26 ENCOUNTER — Encounter: Payer: Self-pay | Admitting: Internal Medicine

## 2017-04-26 ENCOUNTER — Encounter (HOSPITAL_COMMUNITY): Payer: Self-pay | Admitting: *Deleted

## 2017-04-26 DIAGNOSIS — E785 Hyperlipidemia, unspecified: Secondary | ICD-10-CM | POA: Insufficient documentation

## 2017-04-26 DIAGNOSIS — K7030 Alcoholic cirrhosis of liver without ascites: Secondary | ICD-10-CM

## 2017-04-26 DIAGNOSIS — F17210 Nicotine dependence, cigarettes, uncomplicated: Secondary | ICD-10-CM | POA: Diagnosis not present

## 2017-04-26 DIAGNOSIS — Z13810 Encounter for screening for upper gastrointestinal disorder: Secondary | ICD-10-CM | POA: Diagnosis not present

## 2017-04-26 DIAGNOSIS — K449 Diaphragmatic hernia without obstruction or gangrene: Secondary | ICD-10-CM | POA: Insufficient documentation

## 2017-04-26 DIAGNOSIS — Z8249 Family history of ischemic heart disease and other diseases of the circulatory system: Secondary | ICD-10-CM | POA: Insufficient documentation

## 2017-04-26 DIAGNOSIS — R911 Solitary pulmonary nodule: Secondary | ICD-10-CM | POA: Insufficient documentation

## 2017-04-26 DIAGNOSIS — K766 Portal hypertension: Secondary | ICD-10-CM | POA: Diagnosis not present

## 2017-04-26 DIAGNOSIS — Z79899 Other long term (current) drug therapy: Secondary | ICD-10-CM | POA: Insufficient documentation

## 2017-04-26 DIAGNOSIS — M1990 Unspecified osteoarthritis, unspecified site: Secondary | ICD-10-CM | POA: Diagnosis not present

## 2017-04-26 DIAGNOSIS — J449 Chronic obstructive pulmonary disease, unspecified: Secondary | ICD-10-CM | POA: Diagnosis not present

## 2017-04-26 DIAGNOSIS — Z86010 Personal history of colonic polyps: Secondary | ICD-10-CM | POA: Diagnosis not present

## 2017-04-26 DIAGNOSIS — I10 Essential (primary) hypertension: Secondary | ICD-10-CM | POA: Diagnosis not present

## 2017-04-26 DIAGNOSIS — K31819 Angiodysplasia of stomach and duodenum without bleeding: Secondary | ICD-10-CM | POA: Diagnosis not present

## 2017-04-26 DIAGNOSIS — F418 Other specified anxiety disorders: Secondary | ICD-10-CM | POA: Diagnosis not present

## 2017-04-26 DIAGNOSIS — K3189 Other diseases of stomach and duodenum: Secondary | ICD-10-CM

## 2017-04-26 DIAGNOSIS — K7460 Unspecified cirrhosis of liver: Secondary | ICD-10-CM | POA: Diagnosis not present

## 2017-04-26 DIAGNOSIS — Z1211 Encounter for screening for malignant neoplasm of colon: Secondary | ICD-10-CM | POA: Insufficient documentation

## 2017-04-26 DIAGNOSIS — Z96642 Presence of left artificial hip joint: Secondary | ICD-10-CM | POA: Insufficient documentation

## 2017-04-26 HISTORY — PX: COLONOSCOPY WITH PROPOFOL: SHX5780

## 2017-04-26 HISTORY — PX: ESOPHAGOGASTRODUODENOSCOPY (EGD) WITH PROPOFOL: SHX5813

## 2017-04-26 SURGERY — COLONOSCOPY WITH PROPOFOL
Anesthesia: Monitor Anesthesia Care

## 2017-04-26 MED ORDER — MIDAZOLAM HCL 2 MG/2ML IJ SOLN
INTRAMUSCULAR | Status: AC
  Filled 2017-04-26: qty 2

## 2017-04-26 MED ORDER — LIDOCAINE VISCOUS 2 % MT SOLN
15.0000 mL | Freq: Once | OROMUCOSAL | Status: AC
  Administered 2017-04-26: 15 mL via OROMUCOSAL

## 2017-04-26 MED ORDER — LIDOCAINE HCL (PF) 1 % IJ SOLN
INTRAMUSCULAR | Status: AC
  Filled 2017-04-26: qty 15

## 2017-04-26 MED ORDER — IPRATROPIUM-ALBUTEROL 0.5-2.5 (3) MG/3ML IN SOLN
RESPIRATORY_TRACT | Status: AC
  Filled 2017-04-26: qty 3

## 2017-04-26 MED ORDER — CHLORHEXIDINE GLUCONATE CLOTH 2 % EX PADS: 6.0000 | MEDICATED_PAD | Freq: Once | CUTANEOUS | Status: DC

## 2017-04-26 MED ORDER — LACTATED RINGERS IV SOLN
INTRAVENOUS | Status: DC
  Administered 2017-04-26: 13:00:00 via INTRAVENOUS

## 2017-04-26 MED ORDER — PROPOFOL 10 MG/ML IV BOLUS
INTRAVENOUS | Status: AC
  Filled 2017-04-26: qty 20

## 2017-04-26 MED ORDER — IPRATROPIUM-ALBUTEROL 0.5-2.5 (3) MG/3ML IN SOLN
3.0000 mL | Freq: Once | RESPIRATORY_TRACT | Status: AC
  Administered 2017-04-26: 3 mL via RESPIRATORY_TRACT

## 2017-04-26 MED ORDER — LIDOCAINE VISCOUS 2 % MT SOLN
OROMUCOSAL | Status: AC
  Filled 2017-04-26: qty 15

## 2017-04-26 MED ORDER — PROPOFOL 500 MG/50ML IV EMUL
INTRAVENOUS | Status: DC | PRN
  Administered 2017-04-26: 100 ug/kg/min via INTRAVENOUS

## 2017-04-26 MED ORDER — FENTANYL CITRATE (PF) 100 MCG/2ML IJ SOLN
INTRAMUSCULAR | Status: AC
  Filled 2017-04-26: qty 2

## 2017-04-26 MED ORDER — MIDAZOLAM HCL 2 MG/2ML IJ SOLN
1.0000 mg | INTRAMUSCULAR | Status: AC
  Administered 2017-04-26: 2 mg via INTRAVENOUS

## 2017-04-26 MED ORDER — FENTANYL CITRATE (PF) 100 MCG/2ML IJ SOLN
25.0000 ug | Freq: Once | INTRAMUSCULAR | Status: AC
  Administered 2017-04-26: 25 ug via INTRAVENOUS

## 2017-04-26 MED ORDER — ONDANSETRON HCL 4 MG/2ML IJ SOLN
INTRAMUSCULAR | Status: AC
  Filled 2017-04-26: qty 2

## 2017-04-26 MED ORDER — ETOMIDATE 2 MG/ML IV SOLN
INTRAVENOUS | Status: AC
  Filled 2017-04-26: qty 10

## 2017-04-26 MED ORDER — ALBUTEROL SULFATE HFA 108 (90 BASE) MCG/ACT IN AERS
INHALATION_SPRAY | RESPIRATORY_TRACT | Status: AC
  Filled 2017-04-26: qty 6.7

## 2017-04-26 MED ORDER — SIMETHICONE 40 MG/0.6ML PO SUSP
ORAL | Status: AC
  Filled 2017-04-26: qty 1.2

## 2017-04-26 NOTE — Anesthesia Procedure Notes (Signed)
Procedure Name: MAC Date/Time: 04/26/2017 1:57 PM Performed by: Andree Elk Shadman Tozzi A, CRNA Pre-anesthesia Checklist: Patient identified, Emergency Drugs available, Suction available, Patient being monitored and Timeout performed Oxygen Delivery Method: Simple face mask

## 2017-04-26 NOTE — Interval H&P Note (Signed)
History and Physical Interval Note:  04/26/2017 1:48 PM  SEBERT STOLLINGS  has presented today for surgery, with the diagnosis of history of colon polyps, cirrhosis, variceal screening  The various methods of treatment have been discussed with the patient and family. After consideration of risks, benefits and other options for treatment, the patient has consented to  Procedure(s) with comments: COLONOSCOPY WITH PROPOFOL (N/A) - 2:00PM - spoke with pt's wife, knows to arrive at 11:45 ESOPHAGOGASTRODUODENOSCOPY (EGD) WITH PROPOFOL (N/A) as a surgical intervention .  The patient's history has been reviewed, patient examined, no change in status, stable for surgery.  I have reviewed the patient's chart and labs.  Questions were answered to the patient's satisfaction.     [name]   Patient seen and examined. Here for screening EGD and surveillance colonoscopy for plan.  The risks, benefits, limitations, imponderables and alternatives regarding both EGD and colonoscopy have been reviewed with the patient. Questions have been answered. All parties agreeable.

## 2017-04-26 NOTE — Op Note (Signed)
[facility] Patient Name: Ian Aguilar Procedure Date: 04/26/2017 2:09 PM MRN: [id number] Date of Birth: 06/26/1940 Attending MD: Norvel Richards , MD CSN: [id number] Age: 77 Admit Type: Outpatient Procedure:                Colonoscopy Indications:              High risk colon cancer surveillance: Personal                            history of colonic polyps Providers:                Norvel Richards, MD, Janeece Riggers, RN, Nelma Rothman, Technician Referring MD:              Medicines:                Propofol per Anesthesia Complications:            No immediate complications. Estimated Blood Loss:     Estimated blood loss: none. Procedure:                Pre-Anesthesia Assessment:                           - Prior to the procedure, a History and Physical                            was performed, and patient medications and                            allergies were reviewed. The patient's tolerance of                            previous anesthesia was also reviewed. The risks                            and benefits of the procedure and the sedation                            options and risks were discussed with the patient.                            All questions were answered, and informed consent                            was obtained. Prior Anticoagulants: The patient has                            taken no previous anticoagulant or antiplatelet                            agents. ASA Grade Assessment: III - A patient with  severe systemic disease. After reviewing the risks                            and benefits, the patient was deemed in                            satisfactory condition to undergo the procedure.                           After obtaining informed consent, the colonoscope                            was passed under direct vision. Throughout the                            procedure, the patient's  blood pressure, pulse, and                            oxygen saturations were monitored continuously. The                            EC-[id number] ([id number]) scope was introduced through                            the and advanced to the the cecum, identified by                            appendiceal orifice and ileocecal valve. The                            colonoscopy was performed without difficulty. The                            patient tolerated the procedure well. The quality                            of the bowel preparation was adequate. The                            ileocecal valve, appendiceal orifice, and rectum                            were photographed. Scope In: 2:15:35 PM Scope Out: 2:25:31 PM Scope Withdrawal Time: 0 hours 6 minutes 47 seconds  Total Procedure Duration: 0 hours 9 minutes 56 seconds  Findings:      The perianal and digital rectal examinations were normal.      The colonic mucosa was diffusely congested but otherwise appear normal. Impression:               - The entire examined colon is normal (aside from                            congestion secondary to cirrhosis).                           -  No specimens collected. Moderate Sedation:      Moderate (conscious) sedation was personally administered by an       anesthesia professional. The following parameters were monitored: oxygen       saturation, heart rate, blood pressure, respiratory rate, EKG, adequacy       of pulmonary ventilation, and response to care. Total physician       intraservice time was 20 minutes. Recommendation:           - Patient has a contact number available for                            emergencies. The signs and symptoms of potential                            delayed complications were discussed with the                            patient. Return to normal activities tomorrow.                            Written discharge instructions were provided to the                             patient.                           - Resume previous diet.                           - Continue present medications.                           - No repeat colonoscopy due to age.                           - Return to GI clinic in 3 months. See EGD report. Procedure Code(s):        --- Professional ---                           [phone], Colonoscopy, flexible; diagnostic, including                            collection of specimen(s) by brushing or washing,                            when performed (separate procedure) Diagnosis Code(s):        --- Professional ---                           Z86.010, Personal history of colonic polyps CPT copyright 2016 [employer]. All rights reserved. The codes documented in this report are preliminary and upon coder review may  be revised to meet current compliance requirements. Cristopher Estimable. Sallyann Kinnaird, MD Norvel Richards, MD 04/26/2017 2:33:59 PM This report has been signed electronically. Number of Addenda: 0

## 2017-04-26 NOTE — Op Note (Signed)
[facility] Patient Name: Ian Aguilar Procedure Date: 04/26/2017 1:41 PM MRN: [id number] Date of Birth: 07/09/1940 Attending MD: Norvel Richards , MD CSN: [id number] Age: 77 Admit Type: Outpatient Procedure:                Upper GI endoscopy Indications:              Screening procedure Providers:                Norvel Richards, MD, Janeece Riggers, RN, Nelma Rothman, Technician Referring MD:              Medicines:                Propofol per Anesthesia Complications:            No immediate complications. Estimated Blood Loss:     Estimated blood loss: none. Procedure:                Pre-Anesthesia Assessment:                           - Prior to the procedure, a History and Physical                            was performed, and patient medications and                            allergies were reviewed. The patient's tolerance of                            previous anesthesia was also reviewed. The risks                            and benefits of the procedure and the sedation                            options and risks were discussed with the patient.                            All questions were answered, and informed consent                            was obtained. Prior Anticoagulants: The patient has                            taken no previous anticoagulant or antiplatelet                            agents. ASA Grade Assessment: III - A patient with                            severe systemic disease. After reviewing the risks  and benefits, the patient was deemed in                            satisfactory condition to undergo the procedure.                           After obtaining informed consent, the endoscope was                            passed under direct vision. Throughout the                            procedure, the patient's blood pressure, pulse, and                            oxygen saturations were  monitored continuously. The                            [id number] ([id number]) scope was introduced through the                            and advanced to the second part of duodenum. The                            upper GI endoscopy was accomplished without                            difficulty. The patient tolerated the procedure                            well. Scope In: 2:05:24 PM Scope Out: 2:07:55 PM Total Procedure Duration: 0 hours 2 minutes 31 seconds  Findings:      The examined esophagus was normal.      A small hiatal hernia was present.      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      Mild portal hypertensive gastropathy was found in the entire examined       stomach.      The duodenal bulb and second portion of the duodenum were normal. Impression:               - Normal esophagus.                           - Small hiatal hernia.                           - Gastric antral vascular ectasia.                           - Portal hypertensive gastropathy.                           - Normal duodenal bulb and second portion of the  duodenum.                           - No specimens collected. Moderate Sedation:      Moderate (conscious) sedation was personally administered by an       anesthesia professional. The following parameters were monitored: oxygen       saturation, heart rate, blood pressure, respiratory rate, EKG, adequacy       of pulmonary ventilation, and response to care. Total physician       intraservice time was 6 minutes. Recommendation:           - Patient has a contact number available for                            emergencies. The signs and symptoms of potential                            delayed complications were discussed with the                            patient. Return to normal activities tomorrow.                            Written discharge instructions were provided to the                             patient.                           - Resume previous diet.                           - Continue present medications.                           - Repeat upper endoscopy in 2 years for screening                            purposes.                           - Return to GI clinic in 3 months. See colonoscopy                            report. Procedure Code(s):        --- Professional ---                           [phone], Esophagogastroduodenoscopy, flexible,                            transoral; diagnostic, including collection of                            specimen(s) by brushing or washing, when performed                            (  separate procedure) Diagnosis Code(s):        --- Professional ---                           K44.9, Diaphragmatic hernia without obstruction or                            gangrene                           K31.819, Angiodysplasia of stomach and duodenum                            without bleeding                           K76.6, Portal hypertension                           K31.89, Other diseases of stomach and duodenum                           Z13.810, Encounter for screening for upper                            gastrointestinal disorder CPT copyright 2016 American [employer] Association. All rights reserved. The codes documented in this report are preliminary and upon coder review may  be revised to meet current compliance requirements. Cristopher Estimable. Taeko Schaffer, MD Norvel Richards, MD 04/26/2017 2:12:44 PM This report has been signed electronically. Number of Addenda: 0

## 2017-04-26 NOTE — Interval H&P Note (Signed)
History and Physical Interval Note:  04/26/2017 12:47 PM  ISSAAC Aguilar  has presented today for surgery, with the diagnosis of history of colon polyps, cirrhosis, variceal screening  The various methods of treatment have been discussed with the patient and family. After consideration of risks, benefits and other options for treatment, the patient has consented to  Procedure(s) with comments: COLONOSCOPY WITH PROPOFOL (N/A) - 2:00PM - spoke with pt's wife, knows to arrive at 11:45 ESOPHAGOGASTRODUODENOSCOPY (EGD) WITH PROPOFOL (N/A) as a surgical intervention .  The patient's history has been reviewed, patient examined, no change in status, stable for surgery.  I have reviewed the patient's chart and labs.  Questions were answered to the patient's satisfaction.     [name]  Patient seen and examined. No change. Screening EGD and surveillance colonoscopy per plan.  The risks, benefits, limitations, imponderables and alternatives regarding both EGD and colonoscopy have been reviewed with the patient. Questions have been answered. All parties agreeable.

## 2017-04-26 NOTE — Anesthesia Postprocedure Evaluation (Signed)
Anesthesia Post Note  Patient: Ian Aguilar  Procedure(s) Performed: COLONOSCOPY WITH PROPOFOL (N/A ) ESOPHAGOGASTRODUODENOSCOPY (EGD) WITH PROPOFOL (N/A )  Patient location during evaluation: PACU Anesthesia Type: MAC Level of consciousness: awake and alert, oriented and patient cooperative Pain management: pain level controlled Vital Signs Assessment: post-procedure vital signs reviewed and stable Respiratory status: spontaneous breathing and respiratory function stable Cardiovascular status: stable Postop Assessment: no apparent nausea or vomiting Anesthetic complications: no     Last Vitals:  Vitals:   04/26/17 1355 04/26/17 1435  BP: (!) 143/75 (!) 128/56  Pulse:  82  Resp: (!) 22 19  Temp:  (P) 36.6 C  SpO2: 95% 99%    Last Pain:  Vitals:   04/26/17 1226  TempSrc: Oral                 [name] A

## 2017-04-26 NOTE — Discharge Instructions (Signed)
°Colonoscopy °Discharge Instructions ° °Read the instructions outlined below and refer to this sheet in the next few weeks. These discharge instructions provide you with general information on caring for yourself after you leave the hospital. Your doctor may also give you specific instructions. While your treatment has been planned according to the most current medical practices available, unavoidable complications occasionally occur. If you have any problems or questions after discharge, call Dr. Rourk at [phone]. °ACTIVITY °· You may resume your regular activity, but move at a slower pace for the next 24 hours.  °· Take frequent rest periods for the next 24 hours.  °· Walking will help get rid of the air and reduce the bloated feeling in your belly (abdomen).  °· No driving for 24 hours (because of the medicine (anesthesia) used during the test).   °· Do not sign any important legal documents or operate any machinery for 24 hours (because of the anesthesia used during the test).  °NUTRITION °· Drink plenty of fluids.  °· You may resume your normal diet as instructed by your doctor.  °· Begin with a light meal and progress to your normal diet. Heavy or fried foods are harder to digest and may make you feel sick to your stomach (nauseated).  °· Avoid alcoholic beverages for 24 hours or as instructed.  °MEDICATIONS °· You may resume your normal medications unless your doctor tells you otherwise.  °WHAT YOU CAN EXPECT TODAY °· Some feelings of bloating in the abdomen.  °· Passage of more gas than usual.  °· Spotting of blood in your stool or on the toilet paper.  °IF YOU HAD POLYPS REMOVED DURING THE COLONOSCOPY: °· No aspirin products for 7 days or as instructed.  °· No alcohol for 7 days or as instructed.  °· Eat a soft diet for the next 24 hours.  °FINDING OUT THE RESULTS OF YOUR TEST °Not all test results are available during your visit. If your test results are not back during the visit, make an appointment  with your caregiver to find out the results. Do not assume everything is normal if you have not heard from your caregiver or the medical facility. It is important for you to follow up on all of your test results.  °SEEK IMMEDIATE MEDICAL ATTENTION IF: °· You have more than a spotting of blood in your stool.  °· Your belly is swollen (abdominal distention).  °· You are nauseated or vomiting.  °· You have a temperature over 101.  °· You have abdominal pain or discomfort that is severe or gets worse throughout the day.  °EGD °Discharge instructions °Please read the instructions outlined below and refer to this sheet in the next few weeks. These discharge instructions provide you with general information on caring for yourself after you leave the hospital. Your doctor may also give you specific instructions. While your treatment has been planned according to the most current medical practices available, unavoidable complications occasionally occur. If you have any problems or questions after discharge, please call your doctor. °ACTIVITY °· You may resume your regular activity but move at a slower pace for the next 24 hours.  °· Take frequent rest periods for the next 24 hours.  °· Walking will help expel (get rid of) the air and reduce the bloated feeling in your abdomen.  °· No driving for 24 hours (because of the anesthesia (medicine) used during the test).  °· You may shower.  °· Do not sign any important   legal documents or operate any machinery for 24 hours (because of the anesthesia used during the test).  NUTRITION  Drink plenty of fluids.   You may resume your normal diet.   Begin with a light meal and progress to your normal diet.   Avoid alcoholic beverages for 24 hours or as instructed by your caregiver.  MEDICATIONS  You may resume your normal medications unless your caregiver tells you otherwise.  WHAT YOU CAN EXPECT TODAY  You may experience abdominal discomfort such as a feeling of fullness  or gas pains.  FOLLOW-UP  Your doctor will discuss the results of your test with you.  SEEK IMMEDIATE MEDICAL ATTENTION IF ANY OF THE FOLLOWING OCCUR:  Excessive nausea (feeling sick to your stomach) and/or vomiting.   Severe abdominal pain and distention (swelling).   Trouble swallowing.   Temperature over 101 F (37.8 C).   Rectal bleeding or vomiting of blood.    Repeat EGD in 2 years  I do not recommend a future colonoscopy unless new symptoms develop.  Office visit in 3 months.     PATIENT INSTRUCTIONS POST-ANESTHESIA  IMMEDIATELY FOLLOWING SURGERY:  Do not drive or operate machinery for the first twenty four hours after surgery.  Do not make any important decisions for twenty four hours after surgery or while taking narcotic pain medications or sedatives.  If you develop intractable nausea and vomiting or a severe headache please notify your doctor immediately.  FOLLOW-UP:  Please make an appointment with your surgeon as instructed. You do not need to follow up with anesthesia unless specifically instructed to do so.  WOUND CARE INSTRUCTIONS (if applicable):  Keep a dry clean dressing on the anesthesia/puncture wound site if there is drainage.  Once the wound has quit draining you may leave it open to air.  Generally you should leave the bandage intact for twenty four hours unless there is drainage.  If the epidural site drains for more than 36-48 hours please call the anesthesia department.  QUESTIONS?:  Please feel free to call your physician or the hospital operator if you have any questions, and they will be happy to assist you.

## 2017-04-26 NOTE — Anesthesia Preprocedure Evaluation (Signed)
Anesthesia Evaluation  Patient identified by MRN, date of birth, ID band Patient awake    Reviewed: Allergy & Precautions, H&P , NPO status , Patient's Chart, lab work & pertinent test results  Airway Mallampati: II       Dental  (+) Poor Dentition, Edentulous Upper, Partial Lower   Pulmonary COPD (hx benign pulm nodule), Current Smoker,    breath sounds clear to auscultation       Cardiovascular hypertension, negative cardio ROS   Rhythm:Regular     Neuro/Psych PSYCHIATRIC DISORDERS Anxiety    GI/Hepatic neg GERD  ,(+) Cirrhosis     substance abuse  alcohol use,   Endo/Other    Renal/GU      Musculoskeletal   Abdominal   Peds  Hematology   Anesthesia Other Findings   Reproductive/Obstetrics                             Anesthesia Physical Anesthesia Plan  ASA: III  Anesthesia Plan: MAC   Post-op Pain Management:    Induction: Intravenous  PONV Risk Score and Plan:   Airway Management Planned: Simple Face Mask  Additional Equipment:   Intra-op Plan:   Post-operative Plan:   Informed Consent: I have reviewed the patients History and Physical, chart, labs and discussed the procedure including the risks, benefits and alternatives for the proposed anesthesia with the patient or authorized representative who has indicated his/her understanding and acceptance.     Plan Discussed with:   Anesthesia Plan Comments:         Anesthesia Quick Evaluation

## 2017-04-26 NOTE — Transfer of Care (Signed)
Immediate Anesthesia Transfer of Care Note  Patient: Ian Aguilar  Procedure(s) Performed: COLONOSCOPY WITH PROPOFOL (N/A ) ESOPHAGOGASTRODUODENOSCOPY (EGD) WITH PROPOFOL (N/A )  Patient Location: PACU  Anesthesia Type:MAC  Level of Consciousness: awake, alert , oriented and patient cooperative  Airway & Oxygen Therapy: Patient Spontanous Breathing and Patient connected to nasal cannula oxygen  Post-op Assessment: Report given to RN and Post -op Vital signs reviewed and stable  Post vital signs: Reviewed and stable  Last Vitals:  Vitals:   04/26/17 1350 04/26/17 1355  BP: (!) 147/80 (!) 143/75  Pulse:    Resp: (!) 24 (!) 22  Temp:    SpO2: 96% 95%    Last Pain:  Vitals:   04/26/17 1226  TempSrc: Oral         Complications: No apparent anesthesia complications

## 2017-04-30 ENCOUNTER — Encounter (HOSPITAL_COMMUNITY): Payer: Self-pay | Admitting: Internal Medicine

## 2017-04-30 LAB — Acid-Fast Culture and Stain: ACID FAST CULTURE: NEGATIVE

## 2017-05-01 ENCOUNTER — Ambulatory Visit (HOSPITAL_COMMUNITY): Payer: PPO | Admitting: Internal Medicine

## 2017-05-03 ENCOUNTER — Encounter (HOSPITAL_COMMUNITY): Payer: Self-pay | Admitting: *Deleted

## 2017-05-03 ENCOUNTER — Other Ambulatory Visit: Payer: Self-pay

## 2017-05-03 NOTE — Anesthesia Preprocedure Evaluation (Signed)
Anesthesia Evaluation    Reviewed: Allergy & Precautions, Patient's Chart, lab work & pertinent test results  History of Anesthesia Complications Negative for: history of anesthetic complications  Airway Mallampati: II  TM Distance: >3 FB Neck ROM: Full    Dental  (+) Dental Advisory Given   Pulmonary COPD,  COPD inhaler, Current Smoker,    breath sounds clear to auscultation       Cardiovascular negative cardio ROS   Rhythm:Regular  Study Conclusions  - Left ventricle: The cavity size was normal. Wall thickness was   normal. Systolic function was normal. The estimated ejection   fraction was in the range of 60% to 65%. Wall motion was normal;   there were no regional wall motion abnormalities   Neuro/Psych PSYCHIATRIC DISORDERS Anxiety    GI/Hepatic negative GI ROS, (+) Cirrhosis     substance abuse  alcohol use,   Endo/Other  negative endocrine ROS  Renal/GU negative Renal ROS     Musculoskeletal  (+) Arthritis ,   Abdominal   Peds  Hematology negative hematology ROS (+) anemia ,   Anesthesia Other Findings   Reproductive/Obstetrics negative OB ROS                             Anesthesia Physical  Anesthesia Plan  ASA: III  Anesthesia Plan: General   Post-op Pain Management:    Induction: Intravenous  PONV Risk Score and Plan: 2 and Ondansetron and Dexamethasone  Airway Management Planned: Oral ETT  Additional Equipment: None  Intra-op Plan:   Post-operative Plan: Extubation in OR  Informed Consent:   Plan Discussed with:   Anesthesia Plan Comments:         Anesthesia Quick Evaluation

## 2017-05-03 NOTE — Progress Notes (Signed)
Pt denies any acute cardiopulmonary issues. Pt denies having a cardiac cath but stated that a stress test was performed 30 years ago. Pt made aware to stop taking vitamins, fish oil and herbal medications. Do not take any NSAIDs ie: Ibuprofen, Advil, Naproxen (Aleve), Motrin, BC and Goody Powder. Pt verbalized understanding of all pre-op instructions.

## 2017-05-04 ENCOUNTER — Ambulatory Visit (HOSPITAL_COMMUNITY)
Admission: RE | Admit: 2017-05-04 | Discharge: 2017-05-04 | Disposition: A | Discharge status: Home / Self Care | Payer: PPO | Source: Ambulatory Visit | Attending: Thoracic Surgery (Cardiothoracic Vascular Surgery) | Admitting: Thoracic Surgery (Cardiothoracic Vascular Surgery)

## 2017-05-04 ENCOUNTER — Encounter (HOSPITAL_COMMUNITY): Payer: Self-pay | Admitting: Surgery

## 2017-05-04 ENCOUNTER — Encounter (HOSPITAL_COMMUNITY)
Admission: RE | Disposition: A | Discharge status: Home / Self Care | Payer: Self-pay | Source: Ambulatory Visit | Attending: Thoracic Surgery (Cardiothoracic Vascular Surgery)

## 2017-05-04 ENCOUNTER — Ambulatory Visit (HOSPITAL_COMMUNITY): Payer: PPO

## 2017-05-04 ENCOUNTER — Ambulatory Visit (HOSPITAL_COMMUNITY): Payer: PPO | Admitting: Anesthesiology

## 2017-05-04 DIAGNOSIS — Z79899 Other long term (current) drug therapy: Secondary | ICD-10-CM | POA: Diagnosis not present

## 2017-05-04 DIAGNOSIS — C3411 Malignant neoplasm of upper lobe, right bronchus or lung: Secondary | ICD-10-CM | POA: Insufficient documentation

## 2017-05-04 DIAGNOSIS — C771 Secondary and unspecified malignant neoplasm of intrathoracic lymph nodes: Secondary | ICD-10-CM | POA: Diagnosis not present

## 2017-05-04 DIAGNOSIS — F418 Other specified anxiety disorders: Secondary | ICD-10-CM | POA: Insufficient documentation

## 2017-05-04 DIAGNOSIS — I700 Atherosclerosis of aorta: Secondary | ICD-10-CM | POA: Diagnosis not present

## 2017-05-04 DIAGNOSIS — R590 Localized enlarged lymph nodes: Secondary | ICD-10-CM | POA: Diagnosis not present

## 2017-05-04 DIAGNOSIS — K219 Gastro-esophageal reflux disease without esophagitis: Secondary | ICD-10-CM | POA: Insufficient documentation

## 2017-05-04 DIAGNOSIS — I2510 Atherosclerotic heart disease of native coronary artery without angina pectoris: Secondary | ICD-10-CM | POA: Insufficient documentation

## 2017-05-04 DIAGNOSIS — R911 Solitary pulmonary nodule: Secondary | ICD-10-CM

## 2017-05-04 DIAGNOSIS — F17210 Nicotine dependence, cigarettes, uncomplicated: Secondary | ICD-10-CM | POA: Insufficient documentation

## 2017-05-04 DIAGNOSIS — R918 Other nonspecific abnormal finding of lung field: Secondary | ICD-10-CM | POA: Diagnosis not present

## 2017-05-04 DIAGNOSIS — C801 Malignant (primary) neoplasm, unspecified: Secondary | ICD-10-CM | POA: Diagnosis not present

## 2017-05-04 DIAGNOSIS — J449 Chronic obstructive pulmonary disease, unspecified: Secondary | ICD-10-CM | POA: Diagnosis not present

## 2017-05-04 DIAGNOSIS — E785 Hyperlipidemia, unspecified: Secondary | ICD-10-CM | POA: Diagnosis not present

## 2017-05-04 DIAGNOSIS — K7460 Unspecified cirrhosis of liver: Secondary | ICD-10-CM | POA: Diagnosis not present

## 2017-05-04 HISTORY — DX: Presence of dental prosthetic device (complete) (partial): Z97.2

## 2017-05-04 HISTORY — PX: VIDEO BRONCHOSCOPY WITH ENDOBRONCHIAL ULTRASOUND: SHX6177

## 2017-05-04 HISTORY — DX: Solitary pulmonary nodule: R91.1

## 2017-05-04 HISTORY — DX: Presence of spectacles and contact lenses: Z97.3

## 2017-05-04 LAB — CBC
HCT: 46.3 % (ref 39.0–52.0)
Hemoglobin: 15.2 g/dL (ref 13.0–17.0)
MCH: 31.6 pg (ref 26.0–34.0)
MCHC: 32.8 g/dL (ref 30.0–36.0)
MCV: 96.3 fL (ref 78.0–100.0)
Platelets: 214 10*3/uL (ref 150–400)
RBC: 4.81 MIL/uL (ref 4.22–5.81)
RDW: 14.9 % (ref 11.5–15.5)
WBC: 5.4 10*3/uL (ref 4.0–10.5)

## 2017-05-04 LAB — COMPREHENSIVE METABOLIC PANEL
ALT: 17 U/L (ref 17–63)
AST: 29 U/L (ref 15–41)
Albumin: 4 g/dL (ref 3.5–5.0)
Alkaline Phosphatase: 72 U/L (ref 38–126)
Anion gap: 10 (ref 5–15)
BUN: 13 mg/dL (ref 6–20)
CO2: 28 mmol/L (ref 22–32)
Calcium: 9.4 mg/dL (ref 8.9–10.3)
Chloride: 102 mmol/L (ref 101–111)
Creatinine, Ser: 0.88 mg/dL (ref 0.61–1.24)
GFR calc Af Amer: >60 mL/min (ref >60)
GFR calc non Af Amer: >60 mL/min (ref >60)
Glucose, Bld: 91 mg/dL (ref 65–99)
Potassium: 4 mmol/L (ref 3.5–5.1)
Sodium: 140 mmol/L (ref 135–145)
Total Bilirubin: 0.9 mg/dL (ref 0.3–1.2)
Total Protein: 7.6 g/dL (ref 6.5–8.1)

## 2017-05-04 LAB — TYPE AND SCREEN
ABO/RH(D): A NEG
Antibody Screen: NEGATIVE

## 2017-05-04 LAB — PROTIME-INR
INR: 1.07
Prothrombin Time: 13.8 seconds (ref 11.4–15.2)

## 2017-05-04 LAB — APTT: aPTT: 33 seconds (ref 24–36)

## 2017-05-04 SURGERY — BRONCHOSCOPY, WITH EBUS
Anesthesia: General | Site: Chest

## 2017-05-04 MED ORDER — FENTANYL CITRATE (PF) 250 MCG/5ML IJ SOLN
INTRAMUSCULAR | Status: AC
  Filled 2017-05-04: qty 5

## 2017-05-04 MED ORDER — EPHEDRINE 5 MG/ML INJ
INTRAVENOUS | Status: AC
  Filled 2017-05-04: qty 10

## 2017-05-04 MED ORDER — LACTATED RINGERS IV SOLN
INTRAVENOUS | Status: DC | PRN
  Administered 2017-05-04 (×2): via INTRAVENOUS

## 2017-05-04 MED ORDER — HYDROMORPHONE HCL 1 MG/ML IJ SOLN: 0.2500 mg | INTRAMUSCULAR | Status: DC | PRN

## 2017-05-04 MED ORDER — CEFAZOLIN SODIUM-DEXTROSE 2-3 GM-%(50ML) IV SOLR
INTRAVENOUS | Status: DC | PRN
  Administered 2017-05-04: 2 g via INTRAVENOUS

## 2017-05-04 MED ORDER — LIDOCAINE 2% (20 MG/ML) 5 ML SYRINGE
INTRAMUSCULAR | Status: DC | PRN
  Administered 2017-05-04: 100 mg via INTRAVENOUS

## 2017-05-04 MED ORDER — SODIUM CHLORIDE 0.9 % IV SOLN: 1.5000 g | INTRAVENOUS | Status: DC

## 2017-05-04 MED ORDER — PHENYLEPHRINE 40 MCG/ML (10ML) SYRINGE FOR IV PUSH (FOR BLOOD PRESSURE SUPPORT)
PREFILLED_SYRINGE | INTRAVENOUS | Status: DC | PRN
  Administered 2017-05-04 (×2): 80 ug via INTRAVENOUS
  Administered 2017-05-04: 40 ug via INTRAVENOUS
  Administered 2017-05-04: 120 ug via INTRAVENOUS

## 2017-05-04 MED ORDER — ONDANSETRON HCL 4 MG/2ML IJ SOLN
INTRAMUSCULAR | Status: DC | PRN
  Administered 2017-05-04: 4 mg via INTRAVENOUS

## 2017-05-04 MED ORDER — ROCURONIUM BROMIDE 10 MG/ML (PF) SYRINGE
PREFILLED_SYRINGE | INTRAVENOUS | Status: DC | PRN
  Administered 2017-05-04: 50 mg via INTRAVENOUS

## 2017-05-04 MED ORDER — DEXAMETHASONE SODIUM PHOSPHATE 10 MG/ML IJ SOLN
INTRAMUSCULAR | Status: DC | PRN
  Administered 2017-05-04: 10 mg via INTRAVENOUS

## 2017-05-04 MED ORDER — IPRATROPIUM-ALBUTEROL 0.5-2.5 (3) MG/3ML IN SOLN
RESPIRATORY_TRACT | Status: AC
  Filled 2017-05-04: qty 3

## 2017-05-04 MED ORDER — IPRATROPIUM-ALBUTEROL 0.5-2.5 (3) MG/3ML IN SOLN
3.0000 mL | RESPIRATORY_TRACT | Status: DC
  Administered 2017-05-04: 3 mL via RESPIRATORY_TRACT

## 2017-05-04 MED ORDER — MIDAZOLAM HCL 2 MG/2ML IJ SOLN
INTRAMUSCULAR | Status: AC
  Filled 2017-05-04: qty 2

## 2017-05-04 MED ORDER — 0.9 % SODIUM CHLORIDE (POUR BTL) OPTIME
TOPICAL | Status: DC | PRN
  Administered 2017-05-04: 1000 mL

## 2017-05-04 MED ORDER — PROPOFOL 10 MG/ML IV BOLUS
INTRAVENOUS | Status: DC | PRN
  Administered 2017-05-04: 100 mg via INTRAVENOUS

## 2017-05-04 MED ORDER — MIDAZOLAM HCL 2 MG/2ML IJ SOLN
INTRAMUSCULAR | Status: DC | PRN
  Administered 2017-05-04 (×2): 1 mg via INTRAVENOUS

## 2017-05-04 MED ORDER — PROPOFOL 10 MG/ML IV BOLUS
INTRAVENOUS | Status: AC
  Filled 2017-05-04: qty 40

## 2017-05-04 MED ORDER — FENTANYL CITRATE (PF) 250 MCG/5ML IJ SOLN
INTRAMUSCULAR | Status: DC | PRN
  Administered 2017-05-04 (×2): 50 ug via INTRAVENOUS

## 2017-05-04 MED ORDER — EPHEDRINE SULFATE-NACL 50-0.9 MG/10ML-% IV SOSY
PREFILLED_SYRINGE | INTRAVENOUS | Status: DC | PRN
  Administered 2017-05-04: 5 mg via INTRAVENOUS

## 2017-05-04 MED ORDER — SUGAMMADEX SODIUM 200 MG/2ML IV SOLN
INTRAVENOUS | Status: DC | PRN
  Administered 2017-05-04: 200 mg via INTRAVENOUS

## 2017-05-04 MED ORDER — PROMETHAZINE HCL 25 MG/ML IJ SOLN: 6.2500 mg | INTRAMUSCULAR | Status: DC | PRN

## 2017-05-04 MED ORDER — EPINEPHRINE PF 1 MG/ML IJ SOLN
INTRAMUSCULAR | Status: AC
  Filled 2017-05-04: qty 1

## 2017-05-04 SURGICAL SUPPLY — 54 items
APPLIER CLIP LOGIC TI 5 (MISCELLANEOUS) IMPLANT
BRUSH CYTOL CELLEBRITY 1.5X140 (MISCELLANEOUS) IMPLANT
CANISTER SUCT 3000ML PPV (MISCELLANEOUS) ×6 IMPLANT
CLIP VESOCCLUDE MED 6/CT (CLIP) ×3 IMPLANT
CONT SPEC 4OZ CLIKSEAL STRL BL (MISCELLANEOUS) ×9 IMPLANT
COVER BACK TABLE 60X90IN (DRAPES) ×3 IMPLANT
COVER DOME SNAP 22 D (MISCELLANEOUS) ×3 IMPLANT
COVER SURGICAL LIGHT HANDLE (MISCELLANEOUS) ×6 IMPLANT
DERMABOND ADVANCED (GAUZE/BANDAGES/DRESSINGS) ×1
DERMABOND ADVANCED .7 DNX12 (GAUZE/BANDAGES/DRESSINGS) ×2 IMPLANT
DRAPE CHEST BREAST 15X10 FENES (DRAPES) ×3 IMPLANT
ELECT REM PT RETURN 9FT ADLT (ELECTROSURGICAL) ×3
ELECTRODE REM PT RTRN 9FT ADLT (ELECTROSURGICAL) ×2 IMPLANT
FILTER STRAW FLUID ASPIR (MISCELLANEOUS) IMPLANT
FORCEPS BIOP RJ4 1.8 (CUTTING FORCEPS) IMPLANT
FORCEPS RADIAL JAW LRG 4 PULM (INSTRUMENTS) IMPLANT
GAUZE SPONGE 4X4 12PLY STRL (GAUZE/BANDAGES/DRESSINGS) IMPLANT
GAUZE SPONGE 4X4 16PLY XRAY LF (GAUZE/BANDAGES/DRESSINGS) ×3 IMPLANT
GLOVE SURG SIGNA 7.5 PF LTX (GLOVE) ×6 IMPLANT
GOWN STRL REUS W/ TWL LRG LVL3 (GOWN DISPOSABLE) ×2 IMPLANT
GOWN STRL REUS W/ TWL XL LVL3 (GOWN DISPOSABLE) ×4 IMPLANT
GOWN STRL REUS W/TWL LRG LVL3 (GOWN DISPOSABLE) ×1
GOWN STRL REUS W/TWL XL LVL3 (GOWN DISPOSABLE) ×2
HEMOSTAT SURGICEL 2X14 (HEMOSTASIS) IMPLANT
KIT BASIN OR (CUSTOM PROCEDURE TRAY) ×3 IMPLANT
KIT CLEAN ENDO COMPLIANCE (KITS) ×6 IMPLANT
KIT ROOM TURNOVER OR (KITS) ×6 IMPLANT
MARKER SKIN DUAL TIP RULER LAB (MISCELLANEOUS) ×3 IMPLANT
NEEDLE BLUNT 18X1 FOR OR ONLY (NEEDLE) IMPLANT
NEEDLE ECHOTIP HI DEF 22GA (NEEDLE) ×9 IMPLANT
NS IRRIG 1000ML POUR BTL (IV SOLUTION) ×6 IMPLANT
OIL SILICONE PENTAX (PARTS (SERVICE/REPAIRS)) ×3 IMPLANT
PACK GENERAL/GYN (CUSTOM PROCEDURE TRAY) ×3 IMPLANT
PAD ARMBOARD 7.5X6 YLW CONV (MISCELLANEOUS) ×12 IMPLANT
RADIAL JAW LRG 4 PULMONARY (INSTRUMENTS)
SPONGE INTESTINAL PEANUT (DISPOSABLE) IMPLANT
SUT SILK 2 0 (SUTURE)
SUT SILK 2-0 18XBRD TIE 12 (SUTURE) IMPLANT
SUT VIC AB 2-0 CT1 27 (SUTURE)
SUT VIC AB 2-0 CT1 TAPERPNT 27 (SUTURE) IMPLANT
SUT VIC AB 3-0 SH 18 (SUTURE) ×3 IMPLANT
SUT VICRYL 4-0 PS2 18IN ABS (SUTURE) ×3 IMPLANT
SYR 10ML LL (SYRINGE) ×3 IMPLANT
SYR 20CC LL (SYRINGE) ×3 IMPLANT
SYR 20ML ECCENTRIC (SYRINGE) ×3 IMPLANT
SYR 3ML LL SCALE MARK (SYRINGE) IMPLANT
SYR 5ML LL (SYRINGE) ×3 IMPLANT
SYR 5ML LUER SLIP (SYRINGE) ×3 IMPLANT
TOWEL GREEN STERILE (TOWEL DISPOSABLE) ×6 IMPLANT
TOWEL GREEN STERILE FF (TOWEL DISPOSABLE) ×6 IMPLANT
TRAP SPECIMEN MUCOUS 40CC (MISCELLANEOUS) ×3 IMPLANT
TUBE CONNECTING 20X1/4 (TUBING) ×3 IMPLANT
VALVE DISPOSABLE (MISCELLANEOUS) ×3 IMPLANT
WATER STERILE IRR 1000ML POUR (IV SOLUTION) ×6 IMPLANT

## 2017-05-04 NOTE — Transfer of Care (Signed)
Immediate Anesthesia Transfer of Care Note  Patient: Ian Aguilar  Procedure(s) Performed: VIDEO BRONCHOSCOPY WITH ENDOBRONCHIAL ULTRASOUND (N/A Chest)  Patient Location: PACU  Anesthesia Type:General  Level of Consciousness: awake, alert  and oriented  Airway & Oxygen Therapy: Patient Spontanous Breathing and Patient connected to nasal cannula oxygen  Post-op Assessment: Report given to RN and Post -op Vital signs reviewed and stable  Post vital signs: Reviewed and stable  Last Vitals:  Vitals:   05/04/17 0559 05/04/17 0849  BP: (!) 167/69   Pulse:  91  Resp:    Temp:  (!) 36.4 C  SpO2:  92%    Last Pain:  Vitals:   05/04/17 0558  TempSrc: Oral      Patients Stated Pain Goal: 8 (79/15/05 6979)  Complications: No apparent anesthesia complications

## 2017-05-04 NOTE — Anesthesia Procedure Notes (Signed)
Procedure Name: Intubation Date/Time: 05/04/2017 7:37 AM Performed by: Valda Favia, CRNA Pre-anesthesia Checklist: Patient identified, Emergency Drugs available, Suction available and Patient being monitored Patient Re-evaluated:Patient Re-evaluated prior to induction Oxygen Delivery Method: Circle system utilized Preoxygenation: Pre-oxygenation with 100% oxygen Induction Type: IV induction Ventilation: Mask ventilation without difficulty and Oral airway inserted - appropriate to patient size Laryngoscope Size: Mac and 4 Grade View: Grade I Tube type: Oral Tube size: 8.5 mm Number of attempts: 1 Airway Equipment and Method: Stylet Placement Confirmation: ETT inserted through vocal cords under direct vision,  positive ETCO2 and breath sounds checked- equal and bilateral Secured at: 21 cm Tube secured with: Tape Dental Injury: Teeth and Oropharynx as per pre-operative assessment

## 2017-05-04 NOTE — Progress Notes (Signed)
Spoke with Dr Ola Spurr regarding low sats, pt OOB to Bathroom , using [employer] , will give breathing tx

## 2017-05-04 NOTE — Brief Op Note (Signed)
05/04/2017  8:51 AM  PATIENT:  Ian Aguilar  77 y.o. male  PRE-OPERATIVE DIAGNOSIS:  RUL NODULE MEDIASTINAL ADENOPATHY  POST-OPERATIVE DIAGNOSIS: Non-small cell carcinoma, Stage IIIA (T1,N2)  PROCEDURE:  Procedure(s): VIDEO BRONCHOSCOPY WITH ENDOBRONCHIAL ULTRASOUND (N/A)- needle aspirations   SURGEON:  Surgeon(s) and Role:    * Melrose Nakayama, MD - Primary  PHYSICIAN ASSISTANT:   ASSISTANTS: none   ANESTHESIA:   general  EBL: minimal  BLOOD ADMINISTERED:none  DRAINS: none   LOCAL MEDICATIONS USED:  NONE  SPECIMEN:  Source of Specimen:  4R and 7 nodes  DISPOSITION OF SPECIMEN:  PATHOLOGY  COUNTS:  NO Endoscopic  TOURNIQUET:  * No tourniquets in log *  DICTATION: .Other Dictation: Dictation Number -  PLAN OF CARE: Discharge to home after PACU  PATIENT DISPOSITION:  PACU - hemodynamically stable.   Delay start of Pharmacological VTE agent (>24hrs) due to surgical blood loss or risk of bleeding: not applicable

## 2017-05-04 NOTE — Interval H&P Note (Signed)
History and Physical Interval Note:  05/04/2017 7:21 AM  Ian Aguilar  has presented today for surgery, with the diagnosis of RUL NODULE MEDIASTINAL ADENOPATHY  The various methods of treatment have been discussed with the patient and family. After consideration of risks, benefits and other options for treatment, the patient has consented to  Procedure(s): VIDEO BRONCHOSCOPY WITH ENDOBRONCHIAL ULTRASOUND (N/A) possible MEDIASTINOSCOPY (N/A) as a surgical intervention .  The patient's history has been reviewed, patient examined, no change in status, stable for surgery.  I have reviewed the patient's chart and labs.  Questions were answered to the patient's satisfaction.     Melrose Nakayama

## 2017-05-04 NOTE — Discharge Instructions (Addendum)
Do not drive or engage in heavy physical activity today  You may resume normal activities tomorrow  You may use acetaminophen (Tylenol) if needed for discomfort   You may use over the counter cough medication if needed  You may cough up small amounts of blood over the next few days. Call if you cough up more than a tablespoon of blood  Call [phone] if you develop chest pain, shortness of breath or fever > 101F  Follow up as scheduled with the cancer center at [facility]

## 2017-05-04 NOTE — Anesthesia Postprocedure Evaluation (Signed)
Anesthesia Post Note  Patient: Ian Aguilar  Procedure(s) Performed: VIDEO BRONCHOSCOPY WITH ENDOBRONCHIAL ULTRASOUND (N/A Chest)     Patient location during evaluation: PACU Anesthesia Type: General Level of consciousness: sedated Pain management: pain level controlled Vital Signs Assessment: post-procedure vital signs reviewed and stable Respiratory status: spontaneous breathing and respiratory function stable Cardiovascular status: stable Postop Assessment: no apparent nausea or vomiting Anesthetic complications: no    Last Vitals:  Vitals:   05/04/17 0922 05/04/17 0945  BP: (!) 165/77   Pulse: 87 79  Resp: 19 19  Temp:    SpO2: (!) 89% 91%    Last Pain:  Vitals:   05/04/17 0558  TempSrc: Oral                 [name]

## 2017-05-05 ENCOUNTER — Encounter (HOSPITAL_COMMUNITY): Payer: Self-pay | Admitting: Thoracic Surgery (Cardiothoracic Vascular Surgery)

## 2017-05-05 NOTE — Op Note (Signed)
NAMETREMOND, SHIMABUKURO NO.:  [id number]  MEDICAL RECORD NO.:  [id number]  LOCATION:                                 FACILITY:  PHYSICIAN:  Revonda Standard. Roxan Hockey, M.D. DATE OF BIRTH:  DATE OF PROCEDURE:  05/04/2017 DATE OF DISCHARGE:                              OPERATIVE REPORT   PREOPERATIVE DIAGNOSIS:  Right upper lobe nodule with mediastinal adenopathy(T1,N2).  POSTOPERATIVE DIAGNOSIS:  Stage IIIA non-small cell carcinoma (T1, N2).  PROCEDURE:  Bronchoscopy and endobronchial ultrasound with mediastinal lymph node aspirations.  SURGEON:  Revonda Standard. Roxan Hockey, MD.  ASSISTANT:  None.  ANESTHESIA:  General.  FINDINGS:  Quick prep on aspirations from 4R node showed malignant cells consistent with non-small cell carcinoma.  CLINICAL NOTE:  Mr. Fortin is a 77 year old man with a history of tobacco abuse as well as multiple other medical problems.  He had a low- dose screening CT for lung cancer, which showed a suspicious right upper lobe nodule and an enlarged right paratracheal lymph node.  On PET-CT, these lesions were both hypermetabolic.  There also was a question of involvement of the L4 spinous process.  He was advised to undergo bronchoscopy and endobronchial ultrasound for diagnostic and staging purposes.  The indications, risks, benefits, and alternatives were discussed in detail with the patient.  He understood and accepted the risks and agreed to proceed.  OPERATIVE NOTE:  Mr. Downum was brought to the operating room on May 04, 2017.  He had induction of general anesthesia and was intubated. After performing a time-out, flexible fiberoptic bronchoscopy was performed via the endotracheal tube.  It revealed normal endobronchial anatomy with no endobronchial lesions to the level of the subsegmental bronchi.  The endobronchial ultrasound probe was advanced.  A large 4R node anterior to the trachea was identified.  Multiple aspirations  were performed using ultrasound visualization.  Specimens were obtained both with and without suction applied.  Three specimens were taken and sent for quick prep.  While awaiting those results, aspirations were taken from the level 7 node.  This node appeared more normal in size.  These were sent for permanent examination only.  The quick prep returned showing malignant cells consistent with non-small cell carcinoma.  Three additional aspirations were performed of the 4R node with all of the specimens being placed into the cytologic preparation fluid for cell block.  A final inspection was made.  There was no ongoing bleeding.  The scope was removed.  The patient was extubated in the operating room and taken to the postanesthetic care unit in good condition.     Revonda Standard Roxan Hockey, M.D.     SCH/MEDQ  D:  05/04/2017  T:  05/05/2017  Job:  [id number]

## 2017-05-10 ENCOUNTER — Other Ambulatory Visit: Payer: Self-pay | Admitting: *Deleted

## 2017-05-11 ENCOUNTER — Encounter (HOSPITAL_COMMUNITY): Payer: Self-pay | Admitting: Internal Medicine

## 2017-05-11 ENCOUNTER — Other Ambulatory Visit: Payer: Self-pay

## 2017-05-11 ENCOUNTER — Inpatient Hospital Stay (HOSPITAL_COMMUNITY): Payer: PPO | Attending: Internal Medicine | Admitting: Internal Medicine

## 2017-05-11 VITALS — BP 183/72 | HR 74 | Temp 97.9°F | Resp 20 | Wt 179.2 lb

## 2017-05-11 DIAGNOSIS — M25559 Pain in unspecified hip: Secondary | ICD-10-CM | POA: Diagnosis not present

## 2017-05-11 DIAGNOSIS — I10 Essential (primary) hypertension: Secondary | ICD-10-CM

## 2017-05-11 DIAGNOSIS — M1990 Unspecified osteoarthritis, unspecified site: Secondary | ICD-10-CM | POA: Diagnosis not present

## 2017-05-11 DIAGNOSIS — F17210 Nicotine dependence, cigarettes, uncomplicated: Secondary | ICD-10-CM

## 2017-05-11 DIAGNOSIS — F1021 Alcohol dependence, in remission: Secondary | ICD-10-CM | POA: Insufficient documentation

## 2017-05-11 DIAGNOSIS — K7460 Unspecified cirrhosis of liver: Secondary | ICD-10-CM

## 2017-05-11 DIAGNOSIS — M899 Disorder of bone, unspecified: Secondary | ICD-10-CM | POA: Diagnosis not present

## 2017-05-11 DIAGNOSIS — C3481 Malignant neoplasm of overlapping sites of right bronchus and lung: Secondary | ICD-10-CM | POA: Insufficient documentation

## 2017-05-11 NOTE — Patient Instructions (Signed)
[place] at [facility] Discharge Instructions   You were seen today by Dr. Zoila Shutter She went over your biopsy results and your PET scan.  She discussed sending you to a radiation doctor to discuss radiation therapy. She also discussed Chemotherapy as well. We will get a bone scan scheduled for you as well as refer you to radiation for evaluation. We will see you back in    Thank you for choosing [place] at [facility] to provide your oncology and hematology care.  To afford each patient quality time with our provider, please arrive at least 15 minutes before your scheduled appointment time.    If you have a lab appointment with the [facility] please come in thru the  Main Entrance and check in at the main information desk  You need to re-schedule your appointment should you arrive 10 or more minutes late.  We strive to give you quality time with our providers, and arriving late affects you and other patients whose appointments are after yours.  Also, if you no show three or more times for appointments you may be dismissed from the clinic at the providers discretion.     Again, thank you for choosing [facility].  Our hope is that these requests will decrease the amount of time that you wait before being seen by our physicians.       _____________________________________________________________  Should you have questions after your visit to [facility], please contact our office at [phone] between the hours of 8:30 a.m. and 4:30 p.m.  Voicemails left after 4:30 p.m. will not be returned until the following business day.  For prescription refill requests, have your pharmacy contact our office.       Resources For Cancer Patients and their Caregivers ? American Cancer Society: Can assist with transportation, wigs, general needs, runs Look Good Feel Better.        671-012-7403 ? Cancer  Care: Provides financial assistance, online support groups, medication/co-pay assistance.  1-800-813-HOPE (437)402-6540) ? [place] Assists [place] cancer patients and their families through emotional , educational and financial support.  615-034-8091 ? [place] DSS Where to apply for food stamps, Medicaid and utility assistance. 5618359145 ? [facility]: Transportation to medical appointments. 902-065-8803 ? Social Security Administration: May apply for disability if have a Stage IV cancer. (865)721-7845 830-480-3357 ? [employer], Disability and Transit Services: Assists with nutrition, care and transit needs. [place] Support Programs:   > Cancer Support Group  2nd Tuesday of the month 1pm-2pm, Journey Room   > Creative Journey  3rd Tuesday of the month 1130am-1pm, Journey Room

## 2017-05-11 NOTE — Progress Notes (Signed)
Faxed referral and patient records to [facility] for radiation.

## 2017-05-15 DIAGNOSIS — C3411 Malignant neoplasm of upper lobe, right bronchus or lung: Secondary | ICD-10-CM | POA: Insufficient documentation

## 2017-05-16 ENCOUNTER — Other Ambulatory Visit (HOSPITAL_COMMUNITY): Payer: Self-pay | Admitting: Internal Medicine

## 2017-05-16 ENCOUNTER — Ambulatory Visit (HOSPITAL_COMMUNITY)
Admission: RE | Admit: 2017-05-16 | Discharge: 2017-05-16 | Disposition: A | Discharge status: Home / Self Care | Payer: PPO | Source: Ambulatory Visit | Attending: Internal Medicine | Admitting: Internal Medicine

## 2017-05-16 ENCOUNTER — Encounter: Payer: Self-pay | Admitting: *Deleted

## 2017-05-16 ENCOUNTER — Telehealth (HOSPITAL_COMMUNITY): Payer: Self-pay | Admitting: Adult Health

## 2017-05-16 DIAGNOSIS — C3481 Malignant neoplasm of overlapping sites of right bronchus and lung: Secondary | ICD-10-CM

## 2017-05-16 DIAGNOSIS — C3411 Malignant neoplasm of upper lobe, right bronchus or lung: Secondary | ICD-10-CM | POA: Diagnosis not present

## 2017-05-16 DIAGNOSIS — M5136 Other intervertebral disc degeneration, lumbar region: Secondary | ICD-10-CM | POA: Diagnosis not present

## 2017-05-16 DIAGNOSIS — C3490 Malignant neoplasm of unspecified part of unspecified bronchus or lung: Secondary | ICD-10-CM | POA: Diagnosis not present

## 2017-05-16 MED ORDER — GADOBENATE DIMEGLUMINE 529 MG/ML IV SOLN
17.0000 mL | Freq: Once | INTRAVENOUS | Status: AC | PRN
  Administered 2017-05-16: 17 mL via INTRAVENOUS

## 2017-05-16 NOTE — Telephone Encounter (Signed)
Received call from Dr. Quitman Livings with radiation oncology in [place].  Per Dr. Quitman Livings, he would like patient to have MRI spine to further evaluate hypermetabolic L4 lesion, recently noted on PET instead of bone scan.  This will help him with pt's radiation planning.    I spoke directly with Dr. Walden Field about these recommendations from rad onc.  Patient is scheduled for MRI brain later this afternoon.  Dr. Walden Field will place orders for appropriate MRI spine. Perhaps our imaging team could add on the MRI spine to his already scheduled MRI brain today, rather than patient having to come for an additional appointment.  Will cancel bone scan per Dr. Walden Field and Dr. Quitman Livings.   Our scheduling team made aware.   Mike Craze, NP [place] [phone]

## 2017-05-16 NOTE — Progress Notes (Signed)
Oncology Nurse Navigator Documentation  Oncology Nurse Navigator Flowsheets 05/16/2017  Navigator Location [facility]  Navigator Encounter Type Other/I received a call from [facility] in [place].  They wanted update on treatment plan per TB discussion. I faxed TB info.    Barriers/Navigation Needs Coordination of Care  Interventions Coordination of Care  Coordination of Care Other  Acuity Level 2  Time Spent with Patient 30

## 2017-05-16 NOTE — Progress Notes (Signed)
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  Assessment and Plan:  1.  NSCLC.  77 year old man with a long-standing history of tobacco abuse, COPD, ethanol abuse (quit 2 years ago), cirrhosis, hyperlipidemia, arthritis, and depression with anxiety.  He recently had a low-dose CT for screening for lung cancer.  It showed a suspicious right upper lobe nodule and also an enlarged right paratracheal lymph node.  A PET/CT was done which showed both of these lesions were hypermetabolic.  There was also some uptake in the inferior aspect of the L4 spinous process with an SUV of 4.36.  He was referred to Dr. Roxan Hockey for evaluation for biopsy for definitive diagnosis.  He underwent EBUS on 05/04/2017 that showed 4R LN + adenocarcinoma.  Level 7 node showed no malignant cells.  He will be set up for MRI of brain as well as MRI of lumbar spine to complete staging.  He is referred to RT for evaluation. He will RTC to go over imaging studies and will discuss case with RT.    2.  Smoking.  Cessation is recommended.    3.  Cirrhosis.  Followup with GI as recommended.    4.  Hip pain.  He has undergone Hip surgery in the past.  L4 lesion noted on PET scan but no hip lesions noted.  He is set up for MRI of lumbar spine for further evaluation.    5.  Hypertension. BP is 183/72.  Follow-up with PCP.     Interval History:  77 year old man with a long-standing history of tobacco abuse, COPD, ethanol abuse (quit 2 years ago), cirrhosis, hyperlipidemia, arthritis, and depression with anxiety.  He recently had a low-dose CT for screening for lung cancer.  It showed a suspicious right upper  lobe nodule and also an enlarged right paratracheal lymph node.  A PET/CT was done which showed both of these lesions were hypermetabolic.  There was also some uptake in the inferior aspect of the L4 spinous process with an SUV of 4.36.   Current Status:  Pt is seen today for follow-up.  He is here to go over pathology from recent biopsy.    Problem List Patient Active Problem List   Diagnosis Date Noted  . Hepatic cirrhosis ([place]) [K74.60] 04/06/2017  . History of colonic polyps [Z86.010] 04/06/2017  . History of snoring [Z87.898] 09/07/2015  . Fracture of ulnar styloid [S52.613A]   . Benign essential HTN [I10]   . Faintness [R55]   . Thrombocytopenia (Normal) [D69.6] 09/03/2015  . Hyponatremia [E87.1] 09/03/2015  . Closed displaced intertrochanteric fracture of right femur ([place]) [S72.141A]   . Distal radius fracture, right [S52.501A] 09/02/2015  . Fracture of right hip requiring operative repair ([place]) [S72.001A] 09/01/2015  . Alcohol abuse [F10.10] 09/01/2015  . Tobacco abuse [Z72.0] 09/01/2015  . COPD (chronic obstructive pulmonary disease) [J44.9] 09/01/2015  . S/P hip replacement [Z96.649] 01/15/2012  . Weakness of left leg [R29.898] 01/10/2012  . Difficulty in walking(719.7) [R26.2] 01/10/2012  . Pain in left hip [M25.552] 01/10/2012  . Acute blood loss anemia [D62] 12/21/2011  . Cirrhosis- imaging diagnosis 2013 [K74.60] 09/08/2011  . Umbilical hernia [X64.6] 09/08/2011  . OA (osteoarthritis) of hip [M16.9] 09/01/2011  .  Hepatomegaly [R16.0] 06/14/2011  . Encounter for screening colonoscopy 06/14/2011  . JOINT EFFUSION, KNEE [M25.469] 02/10/2008  . KNEE PAIN [M25.569] 02/10/2008    Past Medical History Past Medical History:  Diagnosis Date  . Anxiety   . Arthritis   . Cirrhosis ([place])    confirmed by MRI on 07/04/11.  no immunizations  . COPD (chronic obstructive pulmonary disease) ([place])   . Depression with anxiety   . ETOH abuse   . Hyperlipidemia   . Nodule of upper  lobe of right lung    with mediastinal adenopathy  . Wears dentures   . Wears glasses     Past Surgical History Past Surgical History:  Procedure Laterality Date  . bilateral cataract surgery     [place]  . broken arm  6 yrs ago   left otif of wrist-[place]  . CLOSED REDUCTION WRIST FRACTURE Right 09/02/2015   Procedure: RIGHT WRIST REDUCTION;  Surgeon: Renette Butters, MD;  Location: [facility];  Service: Orthopedics;  Laterality: Right;  with MAC  . COLONOSCOPY  1996   [place]: external hemorrhoids, no polyps  . COLONOSCOPY  06/28/2011   Dr. Ala Bent diverticulosis,tubular adenoma, hyperplastic polyp  . COLONOSCOPY WITH PROPOFOL N/A 04/26/2017   Procedure: COLONOSCOPY WITH PROPOFOL;  Surgeon: Daneil Dolin, MD;  [facility];  Service: Endoscopy;  Laterality: N/A;  2:00PM - spoke with pt's wife, knows to arrive at 11:45  . ESOPHAGOGASTRODUODENOSCOPY  06/28/2011   Dr. Chelsea Aus erosive reflux esophagitis, hiatal hernia-gastritis  . ESOPHAGOGASTRODUODENOSCOPY (EGD) WITH PROPOFOL N/A 04/26/2017   Procedure: ESOPHAGOGASTRODUODENOSCOPY (EGD) WITH PROPOFOL;  Surgeon: Daneil Dolin, MD;  [facility];  Service: Endoscopy;  Laterality: N/A;  . EXTERNAL EAR SURGERY     right ear-cleaned out ear and created new eardrum  . INGUINAL HERNIA REPAIR  2-3 yrs ago   left-[place]  . INTRAMEDULLARY (IM) NAIL INTERTROCHANTERIC Right 09/02/2015   Procedure: RIGHT  INTERTROCHANTRIC HIP;  Surgeon: Renette Butters, MD;  Location: [facility];  Service: Orthopedics;  Laterality: Right;  With MAC  . KNEE SURGERY     left-arthroscopy-[place]  . SKULL FRACTURE ELEVATION     fractured cheeck bone repaired  . TOTAL HIP ARTHROPLASTY  12/18/2011   Procedure: TOTAL HIP ARTHROPLASTY;  Surgeon: Carole Civil, MD;  [facility];  Service: Orthopedics;  Laterality: Left;  [place] VIDEO BRONCHOSCOPY WITH ENDOBRONCHIAL ULTRASOUND N/A 05/04/2017   Procedure: VIDEO BRONCHOSCOPY WITH  ENDOBRONCHIAL ULTRASOUND;  Surgeon: Melrose Nakayama, MD;  Location: [facility] OR;  Service: Thoracic;  Laterality: N/A;  . WISDOM TOOTH EXTRACTION      Family History Family History  Problem Relation Age of Onset  . Stroke Mother 6  . Heart disease Father 62       MI  . Diabetes Maternal Uncle   . Colon cancer Neg Hx   . Cancer Neg Hx      Social History  reports that he has been smoking cigarettes.  He has a 31.00 pack-year smoking history. he has never used smokeless tobacco. He reports that he drinks alcohol. He reports that he does not use drugs.  Medications  Current Outpatient Medications:  .  cholecalciferol (VITAMIN D) 1000 units tablet, Take 1,000 Units by mouth daily., Disp: , Rfl:  .  Nutritional Supplements (ENSURE ENLIVE PO), Take 237 mLs by mouth daily as needed (for poor intake). , Disp: , Rfl:  .  PROAIR HFA 108 (90 Base) MCG/ACT inhaler, Inhale 2 puffs into the  lungs every 6 (six) hours as needed (for shortness of breath/wheezing/cough). , Disp: , Rfl: 2 .  Propylene Glycol-Glycerin (SOOTHE) 0.6-0.6 % SOLN, Place 1-2 drops into both eyes 3 (three) times daily as needed (for dry/irritated eyes.)., Disp: , Rfl:  .  Skin Protectants, Misc. (EUCERIN) cream, Apply 1 application topically 4 (four) times daily as needed for dry skin (for itchy or dry skin)., Disp: , Rfl:  .  Tiotropium Bromide Monohydrate (SPIRIVA RESPIMAT) 2.5 MCG/ACT AERS, Inhale 1 puff into the lungs every evening., Disp: , Rfl:   Allergies Patient has no known allergies.  Review of Systems Review of Systems - Oncology ROS as per HPI otherwise 12 point ROS is negative.   Physical Exam  Vitals Wt Readings from Last 3 Encounters:  05/11/17 179 lb 3.2 oz (81.3 kg)  05/04/17 175 lb (79.4 kg)  04/23/17 176 lb (79.8 kg)   Temp Readings from Last 3 Encounters:  05/11/17 97.9 F (36.6 C) (Oral)  05/04/17 (!) 97 F (36.1 C)  04/26/17 97.8 F (36.6 C) (Oral)   BP Readings from Last 3 Encounters:   05/11/17 (!) 183/72  05/04/17 134/73  04/26/17 (!) 159/67   Pulse Readings from Last 3 Encounters:  05/11/17 74  05/04/17 74  04/26/17 84   Constitutional: Well-developed, well-nourished, and in no distress.   HENT: Head: Normocephalic and atraumatic.  Mouth/Throat: No oropharyngeal exudate. Mucosa moist. Eyes: Pupils are equal, round, and reactive to light. Conjunctivae are normal. No scleral icterus.  Neck: Normal range of motion. Neck supple. No JVD present.  Cardiovascular: Normal rate, regular rhythm and normal heart sounds.  Exam reveals no gallop and no friction rub.   No murmur heard. Pulmonary/Chest: Effort normal and breath sounds normal. No respiratory distress. No wheezes.No rales.  Abdominal: Soft. Bowel sounds are normal. No distension. There is no tenderness. There is no guarding.  Musculoskeletal: No edema or tenderness.  Lymphadenopathy: No cervical, axillary or supraclavicular adenopathy.  Neurological: Alert and oriented to person, place, and time. No cranial nerve deficit.  Skin: Skin is warm and dry. No rash noted. No erythema. No pallor.  Psychiatric: Affect and judgment normal.   Labs No visits with results within 3 Day(s) from this visit.  Latest known visit with results is:  Admission on 05/04/2017, Discharged on 05/04/2017  Component Date Value Ref Range Status  . aPTT 05/04/2017 33  24 - 36 seconds Final   Performed at [facility], [place] [address] [place], [place] [address]  . WBC 05/04/2017 5.4  4.0 - 10.5 K/uL Final  . RBC 05/04/2017 4.81  4.22 - 5.81 MIL/uL Final  . Hemoglobin 05/04/2017 15.2  13.0 - 17.0 g/dL Final  . HCT 05/04/2017 46.3  39.0 - 52.0 % Final  . MCV 05/04/2017 96.3  78.0 - 100.0 fL Final  . MCH 05/04/2017 31.6  26.0 - 34.0 pg Final  . MCHC 05/04/2017 32.8  30.0 - 36.0 g/dL Final  . RDW 05/04/2017 14.9  11.5 - 15.5 % Final  . Platelets 05/04/2017 214  150 - 400 K/uL Final   Performed at [facility], [place] [address] [place], [place] [address]  . Sodium 05/04/2017 140  135 - 145 mmol/L Final  . Potassium 05/04/2017 4.0  3.5 - 5.1 mmol/L Final  . Chloride 05/04/2017 102  101 - 111 mmol/L Final  . CO2 05/04/2017 28  22 - 32 mmol/L Final  . Glucose, Bld 05/04/2017 91  65 - 99 mg/dL Final  .  BUN 05/04/2017 13  6 - 20 mg/dL Final  . Creatinine, Ser 05/04/2017 0.88  0.61 - 1.24 mg/dL Final  . Calcium 05/04/2017 9.4  8.9 - 10.3 mg/dL Final  . Total Protein 05/04/2017 7.6  6.5 - 8.1 g/dL Final  . Albumin 05/04/2017 4.0  3.5 - 5.0 g/dL Final  . AST 05/04/2017 29  15 - 41 U/L Final  . ALT 05/04/2017 17  17 - 63 U/L Final  . Alkaline Phosphatase 05/04/2017 72  38 - 126 U/L Final  . Total Bilirubin 05/04/2017 0.9  0.3 - 1.2 mg/dL Final  . GFR calc non Af Amer 05/04/2017 >60  >60 mL/min Final  . GFR calc Af Amer 05/04/2017 >60  >60 mL/min Final   Comment: (NOTE) The eGFR has been calculated using the CKD EPI equation. This calculation has not been validated in all clinical situations. eGFR's persistently <60 mL/min signify possible Chronic Kidney Disease.   . Anion gap 05/04/2017 10  5 - 15 Final   Performed at [facility], [place] [address], [place], [place] [address]  . Prothrombin Time 05/04/2017 13.8  11.4 - 15.2 seconds Final  . INR 05/04/2017 1.07   Final   Performed at [facility] [address], [place], [place] [address]  . ABO/RH(D) 05/04/2017 A NEG   Final  . Antibody Screen 05/04/2017 NEG   Final  . Sample Expiration 05/04/2017    Final                   Value:05/07/2017 Performed at [facility], [place] [address], [place], [place] [address]     Pet scan done 03/29/2017:  CHEST: Right paratracheal lymph node measures 1.2 cm and has an SUV max equal to 12.8. No additional hypermetabolic lymph nodes within the chest. The pulmonary nodule within the posteromedial right upper lobe measures 1.2 cm and has an SUV max equal to no additional hypermetabolic pulmonary  nodules.  SKELETON: Focal area of moderate increased radiotracer uptake localizes to the inferior aspect of the L4 spinous process within SUV max equal to 4.36. Age indeterminate L1 compression deformity is identified.   Orders Placed This Encounter  Procedures  . MR Brain W Wo Contrast    Standing Status:   Future    Standing Expiration Date:   05/11/2018    Order Specific Question:   If indicated for the ordered procedure, I authorize the administration of contrast media per Radiology protocol    Answer:   Yes    Order Specific Question:   What is the patient's sedation requirement?    Answer:   No Sedation    Order Specific Question:   Does the patient have a pacemaker or implanted devices?    Answer:   No    Order Specific Question:   Radiology Contrast Protocol - do NOT remove file path    Answer:   \\[email or web address]    Order Specific Question:   Reason for Exam additional comments    Answer:   lung cancer, staging    Order Specific Question:   Preferred imaging location?    Answer:   [facility] (table limit-350lbs)  . NM Bone Scan Whole Body    Standing Status:   Future    Standing Expiration Date:   05/11/2018    Order Specific Question:   If indicated for the ordered procedure, I authorize the administration of a radiopharmaceutical per Radiology protocol    Answer:  Yes    Order Specific Question:   Preferred imaging location?    Answer:   [facility]    Order Specific Question:   Radiology Contrast Protocol - do NOT remove file path    Answer:   \\[email or web address]  . MR Lumbar Spine W Contrast    Standing Status:   Future    Standing Expiration Date:   05/16/2018    Scheduling Instructions:     L4 lesion noted on recent PET scan    Order Specific Question:   If indicated for the ordered procedure, I authorize the administration of contrast media per Radiology protocol    Answer:   Yes    Order  Specific Question:   What is the patient's sedation requirement?    Answer:   No Sedation    Order Specific Question:   Does the patient have a pacemaker or implanted devices?    Answer:   No    Order Specific Question:   Radiology Contrast Protocol - do NOT remove file path    Answer:   \\[email or web address]    Order Specific Question:   Reason for Exam additional comments    Answer:   lung cancer, bone lesion L4 noted on Pet scan    Order Specific Question:   Preferred imaging location?    Answer:   [facility] (table limit-350lbs)  . CBC with Differential/Platelet    Standing Status:   Future    Standing Expiration Date:   05/12/2018  . Comprehensive metabolic panel    Standing Status:   Future    Standing Expiration Date:   05/12/2018  . Lactate dehydrogenase    Standing Status:   Future    Standing Expiration Date:   05/12/2018       Zoila Shutter MD

## 2017-05-17 ENCOUNTER — Encounter (HOSPITAL_COMMUNITY): Payer: PPO

## 2017-05-22 ENCOUNTER — Inpatient Hospital Stay (HOSPITAL_COMMUNITY): Payer: PPO | Admitting: Internal Medicine

## 2017-05-22 ENCOUNTER — Other Ambulatory Visit: Payer: Self-pay

## 2017-05-22 ENCOUNTER — Inpatient Hospital Stay (HOSPITAL_COMMUNITY): Payer: PPO

## 2017-05-22 ENCOUNTER — Encounter (HOSPITAL_COMMUNITY): Payer: Self-pay | Admitting: Internal Medicine

## 2017-05-22 VITALS — BP 167/58 | HR 58 | Temp 97.6°F | Resp 16 | Wt 180.0 lb

## 2017-05-22 DIAGNOSIS — C3481 Malignant neoplasm of overlapping sites of right bronchus and lung: Secondary | ICD-10-CM | POA: Diagnosis not present

## 2017-05-22 DIAGNOSIS — K7460 Unspecified cirrhosis of liver: Secondary | ICD-10-CM | POA: Diagnosis not present

## 2017-05-22 DIAGNOSIS — I10 Essential (primary) hypertension: Secondary | ICD-10-CM

## 2017-05-22 DIAGNOSIS — M25559 Pain in unspecified hip: Secondary | ICD-10-CM

## 2017-05-22 DIAGNOSIS — M899 Disorder of bone, unspecified: Secondary | ICD-10-CM

## 2017-05-22 DIAGNOSIS — M1990 Unspecified osteoarthritis, unspecified site: Secondary | ICD-10-CM | POA: Diagnosis not present

## 2017-05-22 DIAGNOSIS — F1021 Alcohol dependence, in remission: Secondary | ICD-10-CM

## 2017-05-22 DIAGNOSIS — F17210 Nicotine dependence, cigarettes, uncomplicated: Secondary | ICD-10-CM

## 2017-05-22 DIAGNOSIS — C3491 Malignant neoplasm of unspecified part of right bronchus or lung: Secondary | ICD-10-CM

## 2017-05-22 LAB — CBC WITH DIFFERENTIAL/PLATELET
Basophils Absolute: 0 10*3/uL (ref 0.0–0.1)
Basophils Relative: 0 %
Eosinophils Absolute: 0.1 10*3/uL (ref 0.0–0.7)
Eosinophils Relative: 2 %
HCT: 44.9 % (ref 39.0–52.0)
Hemoglobin: 14.4 g/dL (ref 13.0–17.0)
Lymphocytes Relative: 20 %
Lymphs Abs: 1 10*3/uL (ref 0.7–4.0)
MCH: 31.2 pg (ref 26.0–34.0)
MCHC: 32.1 g/dL (ref 30.0–36.0)
MCV: 97.4 fL (ref 78.0–100.0)
Monocytes Absolute: 0.5 10*3/uL (ref 0.1–1.0)
Monocytes Relative: 10 %
Neutro Abs: 3.6 10*3/uL (ref 1.7–7.7)
Neutrophils Relative %: 68 %
Platelets: 210 10*3/uL (ref 150–400)
RBC: 4.61 MIL/uL (ref 4.22–5.81)
RDW: 15.1 % (ref 11.5–15.5)
WBC: 5.3 10*3/uL (ref 4.0–10.5)

## 2017-05-22 LAB — COMPREHENSIVE METABOLIC PANEL
ALT: 18 U/L (ref 17–63)
AST: 27 U/L (ref 15–41)
Albumin: 4 g/dL (ref 3.5–5.0)
Alkaline Phosphatase: 83 U/L (ref 38–126)
Anion gap: 10 (ref 5–15)
BUN: 19 mg/dL (ref 6–20)
CO2: 30 mmol/L (ref 22–32)
Calcium: 9.3 mg/dL (ref 8.9–10.3)
Chloride: 102 mmol/L (ref 101–111)
Creatinine, Ser: 0.91 mg/dL (ref 0.61–1.24)
GFR calc Af Amer: >60 mL/min (ref >60)
GFR calc non Af Amer: >60 mL/min (ref >60)
Glucose, Bld: 76 mg/dL (ref 65–99)
Potassium: 4.2 mmol/L (ref 3.5–5.1)
Sodium: 142 mmol/L (ref 135–145)
Total Bilirubin: 0.8 mg/dL (ref 0.3–1.2)
Total Protein: 7.5 g/dL (ref 6.5–8.1)

## 2017-05-22 LAB — LACTATE DEHYDROGENASE: LDH: 152 U/L (ref 98–192)

## 2017-05-22 NOTE — Patient Instructions (Signed)
[place] at [facility] Discharge Instructions   You were seen today by Dr. Zoila Shutter. She went over your recent scans and plan. She discussed Carboplatin, Taxol, Deratulumab chemotherapies. She discussed port placement and getting that scheduled. She discussed chemo teaching with the Nurse Navigator. She discussed starting radiation and chemotherapy together. We will schedule you for chemo teaching and refer you for your port placement. We will see you back in 3 week for treatment. We will see you back in 4 weeks for follow up.    Thank you for choosing [place] at [facility] to provide your oncology and hematology care.  To afford each patient quality time with our provider, please arrive at least 15 minutes before your scheduled appointment time.    If you have a lab appointment with the [facility] please come in thru the  Main Entrance and check in at the main information desk  You need to re-schedule your appointment should you arrive 10 or more minutes late.  We strive to give you quality time with our providers, and arriving late affects you and other patients whose appointments are after yours.  Also, if you no show three or more times for appointments you may be dismissed from the clinic at the providers discretion.     Again, thank you for choosing [facility].  Our hope is that these requests will decrease the amount of time that you wait before being seen by our physicians.       _____________________________________________________________  Should you have questions after your visit to [facility], please contact our office at [phone] between the hours of 8:30 a.m. and 4:30 p.m.  Voicemails left after 4:30 p.m. will not be returned until the following business day.  For prescription refill requests, have your pharmacy contact our office.       Resources For Cancer Patients and their  Caregivers ? American Cancer Society: Can assist with transportation, wigs, general needs, runs Look Good Feel Better.        9141613737 ? Cancer Care: Provides financial assistance, online support groups, medication/co-pay assistance.  1-800-813-HOPE 854 023 2087) ? [facility] Assists [place] cancer patients and their families through emotional , educational and financial support.  262 333 6616 ? [place] DSS Where to apply for food stamps, Medicaid and utility assistance. 360-796-4249 ? [facility]: Transportation to medical appointments. 503 386 2544 ? Social Security Administration: May apply for disability if have a Stage IV cancer. (832) 457-0448 (931)128-3168 ? [employer], Disability and Transit Services: Assists with nutrition, care and transit needs. [place] Support Programs:   > Cancer Support Group  2nd Tuesday of the month 1pm-2pm, Journey Room   > Creative Journey  3rd Tuesday of the month 1130am-1pm, Journey Room

## 2017-05-22 NOTE — Progress Notes (Signed)
Diagnosis Non-small cell cancer of right lung ([place]) - Plan: CBC with Differential/Platelet, Comprehensive metabolic panel, Lactate dehydrogenase  Staging Cancer Staging No matching staging information was found for the patient.  Assessment and Plan: 1.  NSCLC stage III.  77 year old man with a long-standing history of tobacco abuse, COPD, ethanol abuse (quit 2 years ago), cirrhosis, hyperlipidemia, arthritis, and depression with anxiety.  He recently had a low-dose CT for screening for lung cancer done 03/06/2017  That showed a suspicious right upper lobe nodule and also an enlarged right paratracheal lymph node.  A PET/CT was done which showed both of these lesions were hypermetabolic.  There was also some uptake in the inferior aspect of the L4 spinous process with an SUV of 4.36.  He was referred to Dr. Roxan Hockey for evaluation for biopsy for definitive diagnosis.  He underwent EBUS on 05/04/2017 that showed 4R LN + adenocarcinoma.  Level 7 node showed no malignant cells.  He will be set up for MRI of brain done 05/16/2017  that showed no brain mets and MRI of lumbar spine done 05/16/2017 that showed degenerative changes at L4 but no metastatic findings.   He has been seen by RT for evaluation.  I have spoken with Dr. Gillermina Hu and pt is recommended for concurrent chemotherapy with RT.  I have discussed with him options of therapy with Carboplatin AUC 2 with Taxol 45 mg/m2 weekly with RT.  He will also have the option of durvalumab once therapy is completed.  Side effects of Carboplatin and Taxol were discussed with the patient and he was provided written information.  He will be referred to surgery to discuss PAC placement.  Will discuss with RTand coordinate therapy.  All question answered and pt expressed understanding of the information presented.    2.  Smoking.  Cessation is recommended.    3.  Cirrhosis.  Followup with GI as recommended.  Recent LFTs WNL.     4.  Hip pain.  He has undergone  Hip surgery in the past.  L4 lesion noted on PET scan but no hip lesions noted.  MRI of lumbar spine for further evaluation done 04/2017 showed degenerative changes.    5.  Hypertension. BP is 167/58.   Follow-up with PCP.     Interval History:  77 year old man with a long-standing history of tobacco abuse, COPD, ethanol abuse (quit 2 years ago), cirrhosis, hyperlipidemia, arthritis, and depression with anxiety.  He recently had a low-dose CT for screening for lung cancer.  It showed a suspicious right upper lobe nodule and also an enlarged right paratracheal lymph node.  A PET/CT was done which showed both of these lesions were hypermetabolic.  There was also some uptake in the inferior aspect of the L4 spinous process with an SUV of 4.36.   Current Status:  Pt is seen today for follow-up.  He is here to go over recent MRI scans and to discuss treatment options.    Problem List Patient Active Problem List   Diagnosis Date Noted  . Hepatic cirrhosis ([place]) [K74.60] 04/06/2017  . History of colonic polyps [Z86.010] 04/06/2017  . History of snoring [Z87.898] 09/07/2015  . Fracture of ulnar styloid [S52.613A]   . Benign essential HTN [I10]   . Faintness [R55]   . Thrombocytopenia ([place]) [D69.6] 09/03/2015  . Hyponatremia [E87.1] 09/03/2015  . Closed displaced intertrochanteric fracture of right femur ([place]) [S72.141A]   . Distal radius fracture, right [S52.501A] 09/02/2015  . Fracture of right hip requiring operative  repair ([place]) [S72.001A] 09/01/2015  . Alcohol abuse [F10.10] 09/01/2015  . Tobacco abuse [Z72.0] 09/01/2015  . COPD (chronic obstructive pulmonary disease) [J44.9] 09/01/2015  . S/P hip replacement [Z96.649] 01/15/2012  . Weakness of left leg [R29.898] 01/10/2012  . Difficulty in walking(719.7) [R26.2] 01/10/2012  . Pain in left hip [M25.552] 01/10/2012  . Acute blood loss anemia [D62] 12/21/2011  . Cirrhosis- imaging diagnosis 2013 [K74.60] 09/08/2011  . Umbilical hernia  [H82.9] 09/08/2011  . OA (osteoarthritis) of hip [M16.9] 09/01/2011  . Hepatomegaly [R16.0] 06/14/2011  . Encounter for screening colonoscopy 06/14/2011  . JOINT EFFUSION, KNEE [M25.469] 02/10/2008  . KNEE PAIN [M25.569] 02/10/2008    Past Medical History Past Medical History:  Diagnosis Date  . Anxiety   . Arthritis   . Cirrhosis ([place])    confirmed by MRI on 07/04/11.  no immunizations  . COPD (chronic obstructive pulmonary disease) ([place])   . Depression with anxiety   . ETOH abuse   . Hyperlipidemia   . Nodule of upper lobe of right lung    with mediastinal adenopathy  . Wears dentures   . Wears glasses     Past Surgical History Past Surgical History:  Procedure Laterality Date  . bilateral cataract surgery     [place]  . broken arm  6 yrs ago   left otif of wrist-[place]  . CLOSED REDUCTION WRIST FRACTURE Right 09/02/2015   Procedure: RIGHT WRIST REDUCTION;  Surgeon: Renette Butters, MD;  Location: [facility];  Service: Orthopedics;  Laterality: Right;  with MAC  . COLONOSCOPY  1996   [place]: external hemorrhoids, no polyps  . COLONOSCOPY  06/28/2011   Dr. Ala Bent diverticulosis,tubular adenoma, hyperplastic polyp  . COLONOSCOPY WITH PROPOFOL N/A 04/26/2017   Procedure: COLONOSCOPY WITH PROPOFOL;  Surgeon: Daneil Dolin, MD;  [facility];  Service: Endoscopy;  Laterality: N/A;  2:00PM - spoke with pt's wife, knows to arrive at 11:45  . ESOPHAGOGASTRODUODENOSCOPY  06/28/2011   Dr. Chelsea Aus erosive reflux esophagitis, hiatal hernia-gastritis  . ESOPHAGOGASTRODUODENOSCOPY (EGD) WITH PROPOFOL N/A 04/26/2017   Procedure: ESOPHAGOGASTRODUODENOSCOPY (EGD) WITH PROPOFOL;  Surgeon: Daneil Dolin, MD;  [facility];  Service: Endoscopy;  Laterality: N/A;  . EXTERNAL EAR SURGERY     right ear-cleaned out ear and created new eardrum  . INGUINAL HERNIA REPAIR  2-3 yrs ago   left-[place]  . INTRAMEDULLARY (IM) NAIL INTERTROCHANTERIC Right 09/02/2015    Procedure: RIGHT  INTERTROCHANTRIC HIP;  Surgeon: Renette Butters, MD;  Location: [facility];  Service: Orthopedics;  Laterality: Right;  With MAC  . KNEE SURGERY     left-arthroscopy-[place]  . SKULL FRACTURE ELEVATION     fractured cheeck bone repaired  . TOTAL HIP ARTHROPLASTY  12/18/2011   Procedure: TOTAL HIP ARTHROPLASTY;  Surgeon: Carole Civil, MD;  Location: [facility];  Service: Orthopedics;  Laterality: Left;  Marland Kitchen VIDEO BRONCHOSCOPY WITH ENDOBRONCHIAL ULTRASOUND N/A 05/04/2017   Procedure: VIDEO BRONCHOSCOPY WITH ENDOBRONCHIAL ULTRASOUND;  Surgeon: Melrose Nakayama, MD;  Location: [facility];  Service: Thoracic;  Laterality: N/A;  . WISDOM TOOTH EXTRACTION      Family History Family History  Problem Relation Age of Onset  . Stroke Mother 7  . Heart disease Father 11       MI  . Diabetes Maternal Uncle   . Colon cancer Neg Hx   . Cancer Neg Hx      Social History  reports that he has been smoking cigarettes.  He has  a 31.00 pack-year smoking history. He has never used smokeless tobacco. He reports that he drinks alcohol. He reports that he does not use drugs.  Medications  Current Outpatient Medications:  .  cholecalciferol (VITAMIN D) 1000 units tablet, Take 1,000 Units by mouth daily., Disp: , Rfl:  .  Nutritional Supplements (ENSURE ENLIVE PO), Take 237 mLs by mouth daily as needed (for poor intake). , Disp: , Rfl:  .  PROAIR HFA 108 (90 Base) MCG/ACT inhaler, Inhale 2 puffs into the lungs every 6 (six) hours as needed (for shortness of breath/wheezing/cough). , Disp: , Rfl: 2 .  Propylene Glycol-Glycerin (SOOTHE) 0.6-0.6 % SOLN, Place 1-2 drops into both eyes 3 (three) times daily as needed (for dry/irritated eyes.)., Disp: , Rfl:  .  Skin Protectants, Misc. (EUCERIN) cream, Apply 1 application topically 4 (four) times daily as needed for dry skin (for itchy or dry skin)., Disp: , Rfl:  .  Tiotropium Bromide Monohydrate (SPIRIVA RESPIMAT) 2.5 MCG/ACT AERS, Inhale 1 puff into  the lungs every evening., Disp: , Rfl:   Allergies Patient has no known allergies.  Review of Systems Review of Systems - Oncology ROS as per HPI otherwise 12 point ROS is negative.   Physical Exam  Vitals Wt Readings from Last 3 Encounters:  05/22/17 180 lb (81.6 kg)  05/11/17 179 lb 3.2 oz (81.3 kg)  05/04/17 175 lb (79.4 kg)   Temp Readings from Last 3 Encounters:  05/22/17 97.6 F (36.4 C) (Oral)  05/11/17 97.9 F (36.6 C) (Oral)  05/04/17 (!) 97 F (36.1 C)   BP Readings from Last 3 Encounters:  05/22/17 (!) 167/58  05/11/17 (!) 183/72  05/04/17 134/73   Pulse Readings from Last 3 Encounters:  05/22/17 (!) 58  05/11/17 74  05/04/17 74   Constitutional: Well-developed, well-nourished, and in no distress.   HENT: Head: Normocephalic and atraumatic.  Mouth/Throat: No oropharyngeal exudate. Mucosa moist. Eyes: Pupils are equal, round, and reactive to light. Conjunctivae are normal. No scleral icterus.  Neck: Normal range of motion. Neck supple. No JVD present.  Cardiovascular: Normal rate, regular rhythm and normal heart sounds.  Exam reveals no gallop and no friction rub.   No murmur heard. Pulmonary/Chest: Effort normal and breath sounds normal. No respiratory distress. No wheezes.No rales.  Abdominal: Soft. Bowel sounds are normal. No distension. There is no tenderness. There is no guarding.  Musculoskeletal: No edema or tenderness.  Lymphadenopathy: No cervical, axillary or supraclavicular adenopathy.  Neurological: Alert and oriented to person, place, and time. No cranial nerve deficit.  Skin: Skin is warm and dry. No rash noted. No erythema. No pallor.  Psychiatric: Affect and judgment normal.   Labs Appointment on 05/22/2017  Component Date Value Ref Range Status  . WBC 05/22/2017 5.3  4.0 - 10.5 K/uL Final  . RBC 05/22/2017 4.61  4.22 - 5.81 MIL/uL Final  . Hemoglobin 05/22/2017 14.4  13.0 - 17.0 g/dL Final  . HCT 05/22/2017 44.9  39.0 - 52.0 % Final   . MCV 05/22/2017 97.4  78.0 - 100.0 fL Final  . MCH 05/22/2017 31.2  26.0 - 34.0 pg Final  . MCHC 05/22/2017 32.1  30.0 - 36.0 g/dL Final  . RDW 05/22/2017 15.1  11.5 - 15.5 % Final  . Platelets 05/22/2017 210  150 - 400 K/uL Final  . Neutrophils Relative % 05/22/2017 68  % Final  . Neutro Abs 05/22/2017 3.6  1.7 - 7.7 K/uL Final  . Lymphocytes Relative 05/22/2017 20  %  Final  . Lymphs Abs 05/22/2017 1.0  0.7 - 4.0 K/uL Final  . Monocytes Relative 05/22/2017 10  % Final  . Monocytes Absolute 05/22/2017 0.5  0.1 - 1.0 K/uL Final  . Eosinophils Relative 05/22/2017 2  % Final  . Eosinophils Absolute 05/22/2017 0.1  0.0 - 0.7 K/uL Final  . Basophils Relative 05/22/2017 0  % Final  . Basophils Absolute 05/22/2017 0.0  0.0 - 0.1 K/uL Final   Performed at [facility], [address], [place], [place] [address]  . Sodium 05/22/2017 142  135 - 145 mmol/L Final  . Potassium 05/22/2017 4.2  3.5 - 5.1 mmol/L Final  . Chloride 05/22/2017 102  101 - 111 mmol/L Final  . CO2 05/22/2017 30  22 - 32 mmol/L Final  . Glucose, Bld 05/22/2017 76  65 - 99 mg/dL Final  . BUN 05/22/2017 19  6 - 20 mg/dL Final  . Creatinine, Ser 05/22/2017 0.91  0.61 - 1.24 mg/dL Final  . Calcium 05/22/2017 9.3  8.9 - 10.3 mg/dL Final  . Total Protein 05/22/2017 7.5  6.5 - 8.1 g/dL Final  . Albumin 05/22/2017 4.0  3.5 - 5.0 g/dL Final  . AST 05/22/2017 27  15 - 41 U/L Final  . ALT 05/22/2017 18  17 - 63 U/L Final  . Alkaline Phosphatase 05/22/2017 83  38 - 126 U/L Final  . Total Bilirubin 05/22/2017 0.8  0.3 - 1.2 mg/dL Final  . GFR calc non Af Amer 05/22/2017 >60  >60 mL/min Final  . GFR calc Af Amer 05/22/2017 >60  >60 mL/min Final   Comment: (NOTE) The eGFR has been calculated using the CKD EPI equation. This calculation has not been validated in all clinical situations. eGFR's persistently <60 mL/min signify possible Chronic Kidney Disease.   Georgiann Hahn gap 05/22/2017 10  5 - 15 Final   Performed at [facility], [address] [place], [place] [address]  . LDH 05/22/2017 152  98 - 192 U/L Final   Performed at [facility], [address] [place], [place] [address]     Pathology Orders Placed This Encounter  Procedures  . CBC with Differential/Platelet    Standing Status:   Future    Standing Expiration Date:   05/23/2018  . Comprehensive metabolic panel    Standing Status:   Future    Standing Expiration Date:   05/23/2018  . Lactate dehydrogenase    Standing Status:   Future    Standing Expiration Date:   05/23/2018       Zoila Shutter MD

## 2017-05-23 ENCOUNTER — Encounter (HOSPITAL_COMMUNITY): Payer: Self-pay | Admitting: Emergency Medicine

## 2017-05-23 MED ORDER — PROCHLORPERAZINE MALEATE 10 MG PO TABS: 10.0000 mg | ORAL_TABLET | Freq: Four times a day (QID) | ORAL | 2 refills | Status: DC | PRN

## 2017-05-23 MED ORDER — ONDANSETRON HCL 8 MG PO TABS: 8.0000 mg | ORAL_TABLET | Freq: Three times a day (TID) | ORAL | 2 refills | Status: DC | PRN

## 2017-05-23 MED ORDER — LIDOCAINE-PRILOCAINE 2.5-2.5 % EX CREA: TOPICAL_CREAM | CUTANEOUS | 2 refills | Status: DC

## 2017-05-23 NOTE — Progress Notes (Signed)
Chemotherapy teaching pulled together.  Appts made.  Chemo schedule faxed to [facility].  Pt is going to come for teaching prior to going to see Dr Arnoldo Morale for consult on 4/4 at 9 am.

## 2017-05-23 NOTE — Patient Instructions (Signed)
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  We are going to treat you with carboplatin and taxol weekly concurrently with radiation.  This treatment is with curative intent.  You will see the doctor regularly throughout treatment.  We monitor your lab work prior to every treatment.  The doctor monitors your response to treatment by the way you are feeling, your blood work, and scans periodically.  There will be wait times while you are here for treatment.  It will take about 30 minutes to 1 hour for your lab work to result.  Then the pharmacy has to mix your medications and bring them to the clinic.    You will have the following premedications prior to receiving chemotherapy:  Premeds: Benadryl:  Help prevent a reaction to the chemotherapy.  Pepcid: antihistamine premed Aloxi - high powered nausea/vomiting prevention medication used for chemotherapy patients.  Dexamethasone - steroid - given to reduce the risk of you having an allergic type reaction to the chemotherapy. Dex can cause you to feel energized, nervous/anxious/jittery, make you have trouble sleeping, and/or make you feel hot/flushed in the face/neck and/or look pink/red in the face/neck. These side effects will pass as the Dex wears off. (takes 20 minutes to infuse)   POTENTIAL SIDE EFFECTS OF TREATMENT: Carboplatin (Generic Name) Other Names: Paraplatin, CBDCA  About This Drug Carboplatin is a drug used to treat cancer. This drug is given in the vein (IV). This drug takes about 30 minutes to infuse.    Possible Side Effects (More Common) . Nausea and throwing up (vomiting). These symptoms may happen within a few hours after your treatment and may last up to 24 hours. Medicines are available to stop or lessen these side effects. . Bone marrow depression. This is a decrease in the number of white blood cells, red blood cells, and  platelets. This may raise your risk of infection, make you tired and weak (fatigue), and raise your risk of bleeding. . Soreness of the mouth and throat. You may have red areas, white patches, or sores that hurt. . This drug may affect how your kidneys work. Your kidney function will be checked as needed. . Electrolyte changes. Your blood will be checked for electrolyte changes as needed.  Side Effects (Less Common) . Hair loss. Some patients lose their hair on the scalp and body. You may notice your hair thinning seven to 14 days after getting this drug. . Effects on the nerves are called peripheral neuropathy. You may feel numbness, tingling, or pain in your hands and feet. It may be hard for you to button your clothes, open jars, or walk as usual. The effect on the nerves may get worse with more doses of the drug. These effects get better in some people after the drug is stopped but it does not get better in all people. . Loose bowel movements (diarrhea) that may last for several days . Decreased hearing or ringing in the ears . Changes in the way food and drinks taste . Changes in liver function. Your liver function will be checked as needed.  Allergic Reactions Serious allergic reactions including anaphylaxis are rare. While you are getting this drug in your vein (IV), tell your nurse right away if you have any of these symptoms of an allergic reaction: . Trouble catching your breath . Feeling like your tongue or throat are swelling . Feeling your heart  beat quickly or in a not normal way (palpitations) . Feeling dizzy or lightheaded . Flushing, itching, rash, and/or hives  Treating Side Effects . Drink 6-8 cups of fluids each day unless your doctor has told you to limit your fluid intake due to some other health problem. A cup is 8 ounces of fluid. If you throw up or have loose bowel movements, you should drink more fluids so that you do not become dehydrated (lack water in the body from  losing too much fluid). . Mouth care is very important. Your mouth care should consist of routine, gentle cleaning of your teeth or dentures and rinsing your mouth with a mixture of 1/2 teaspoon of salt in 8 ounces of water or  teaspoon of baking soda in 8 ounces of water. This should be done at least after each meal and at bedtime. . If you have mouth sores, avoid mouthwash that has alcohol. Avoid alcohol and smoking because they can bother your mouth and throat. . If you have numbness and tingling in your hands and feet, be careful when cooking, walking, and handling sharp objects and hot liquids. . Talk with your nurse about getting a wig before you lose your hair. Also, call the [facility] at 800-ACS-2345 to find out information about the "Look Good, Feel Better" program close to where you live. It is a free program where women getting chemotherapy can learn about wigs, turbans and scarves as well as makeup techniques and skin and nail care.  Food and Drug Interactions There are no known interactions of carboplatin with food. This drug may interact with other medicines. Tell your doctor and pharmacist about all the medicines and dietary supplements (vitamins, minerals, herbs and others) that you are taking at this time. The safety and use of dietary supplements and alternative diets are often not known. Using these might affect your cancer or interfere with your treatment. Until more is known, you should not use dietary supplements or alternative diets without your doctor's help.  When to Call the Doctor Call your doctor or nurse right away if you have any of these symptoms: . Fever of 100.5 F (38 C) or above; chills . Bleeding or bruising that is not normal . Wheezing or trouble breathing . Nausea that stops you from eating or drinking . Throwing up more than once a day . Rash or itching . Loose bowel movements (diarrhea) more than four times a day or diarrhea with weakness or  feeling lightheaded . Call your doctor or nurse as soon as possible if any of these symptoms happen: . Numbness, tingling, decreased feeling or weakness in fingers, toes, arms, or legs . Change in hearing, ringing in the ears . Blurred vision or other changes in eyesight . Decreased urine . Yellowing of skin or eyes  Problems and Reproductive Concerns Sexual problems and reproduction concerns may happen. In both men and women, this drug may affect your ability to have children. This cannot be determined before your treatment. Talk with your doctor or nurse if you plan to have children. Ask for information on sperm or egg banking. In men, this drug may interfere with your ability to make sperm, but it should not change your ability to have sexual relations. In women, menstrual bleeding may become irregular or stop while you are getting this drug. Do not assume that you cannot become pregnant if you do not have a menstrual period. Women may go through signs of menopause (change of life)  like vaginal dryness or itching. Vaginal lubricants can be used to lessen vaginal dryness, itching, and pain during sexual relations. Genetic counseling is available for you to talk about the effects of this drug therapy on future pregnancies. Also, a genetic counselor can look at the possible risk of problems in the unborn baby due to this medicine if an exposure happens during pregnancy. . Pregnancy warning: This drug may have harmful effects on the unborn child, so effective methods of birth control should be used during your cancer treatment. . Breast feeding warning: It is not known if this drug passes into breast milk. For this reason, women should talk to their doctor about the risks and benefits of breast feeding during treatment with this drug because this drug may enter the breast milk and badly harm a breast feeding baby.   Paclitaxel (Taxol)  Paclitaxel is a drug used to treat cancer. It is given in the  vein (IV).  This will take 1 hour to infuse.  The first time this drug is given it will take longer to infuse because it is increased slowly to monitor for reactions.  The nurse will be in the room with you for the first 15 minutes.   Possible Side Effects . Hair loss. Hair loss is often temporary, although with certain medicine, hair loss can sometimes be permanent. Hair loss may happen suddenly or gradually. If you lose hair, you may lose it from your head, face, armpits, pubic area, chest, and/or legs. You may also notice your hair getting thin. . Swelling of your legs, ankles and/or feet (edema) . Flushing . Nausea and throwing up (vomiting) . Loose bowel movements (diarrhea) . Bone marrow depression. This is a decrease in the number of white blood cells, red blood cells, and platelets. This may raise your risk of infection, make you tired and weak (fatigue), and raise your risk of bleeding. . Effects on the nerves are called peripheral neuropathy. You may feel numbness, tingling, or pain in your hands and feet. It may be hard for you to button your clothes, open jars, or walk as usual. The effect on the nerves may get worse with more doses of the drug. These effects get better in some people after the drug is stopped but it does not get better in all people. . Changes in your liver function . Bone, joint and muscle pain . Abnormal EKG . Allergic reaction: Allergic reactions, including anaphylaxis are rare but may happen in some patients. Signs of allergic reaction to this drug may be swelling of the face, feeling like your tongue or throat are swelling, trouble breathing, rash, itching, fever, chills, feeling dizzy, and/or feeling that your heart is beating in a fast or not normal way. If this happens, do not take another dose of this drug. You should get urgent medical treatment. . Infection . Changes in your kidney function. Note: Each of the side effects above was reported in 20% or greater  of patients treated with paclitaxel. Not all possible side effects are included above.  Warnings and Precautions . Severe bone marrow depression  Side Effects . To help with hair loss, wash with a mild shampoo and avoid washing your hair every day. . Avoid rubbing your scalp, instead, pat your hair or scalp dry . Avoid coloring your hair . Limit your use of hair spray, electric curlers, blow dryers, and curling irons. . If you are interested in getting a wig, talk to your nurse. You can also  call the [facility] at 800-ACS-2345 to find out information about the "Look Good, Feel Better" program close to where you live. It is a free program where women getting chemotherapy can learn about wigs, turbans and scarves as well as makeup techniques and skin and nail care. . Ask your doctor or nurse about medicines that are available to help stop or lessen diarrhea and/or nausea. . To help with nausea and vomiting, eat small, frequent meals instead of three large meals a day. Choose foods and drinks that are at room temperature. Ask your nurse or doctor about other helpful tips and medicine that is available to help or stop lessen these symptoms. . If you get diarrhea, eat low-fiber foods that are high in protein and calories and avoid foods that can irritate your digestive tracts or lead to cramping. Ask your nurse or doctor about medicine that can lessen or stop your diarrhea. . Mouth care is very important. Your mouth care should consist of routine, gentle cleaning of your teeth or dentures and rinsing your mouth with a mixture of 1/2 teaspoon of salt in 8 ounces of water or  teaspoon of baking soda in 8 ounces of water. This should be done at least after each meal and at bedtime. . If you have mouth sores, avoid mouthwash that has alcohol. Also avoid alcohol and smoking because they can bother your mouth and throat. . Drink plenty of fluids (a minimum of eight glasses per day is  recommended). . Take your temperature as your doctor or nurse tells you, and whenever you feel like you may have a fever. . Talk to your doctor or nurse about precautions you can take to avoid infections and bleeding. . Be careful when cooking, walking, and handling sharp objects and hot liquids.  Food and Drug Interactions . There are no known interactions of paclitaxel with food. . This drug may interact with other medicines. Tell your doctor and pharmacist about all the medicines and dietary supplements (vitamins, minerals, herbs and others) that you are taking at this time. . The safety and use of dietary supplements and alternative diets are often not known. Using these might affect your cancer or interfere with your treatment. Until more is known, you should not use dietary supplements or alternative diets without your cancer doctor's help.  When to Call the Doctor Call your doctor or nurse if you have any of the following symptoms and/or any new or unusual symptoms: . Fever of 100.5 F (38 C) or above . Chills . Redness, pain, warmth, or swelling at the IV site during the infusion . Signs of allergic reaction: swelling of the face, feeling like your tongue or throat are swelling, trouble breathing, rash, itching, fever, chills, feeling dizzy, and/or feeling that your heart is beating in a fast or not normal way . Feeling that your heart is beating in a fast or not normal way (palpitations) . Weight gain of 5 pounds in one week (fluid retention) . Decreased urine or very dark urine . Signs of liver problems: dark urine, pale bowel movements, bad stomach pain, feeling very tired and weak, unusual itching, or yellowing of the eyes or skin . Heavy menstrual period that lasts longer than normal . Easy bruising or bleeding . Nausea that stops you from eating or drinking, and/or that is not relieved by prescribed medicines. . Loose bowel movements (diarrhea) more than 4 times a day or diarrhea  with weakness or lightheadedness . Pain in your  mouth or throat that makes it hard to eat or drink . Lasting loss of appetite or rapid weight loss of five pounds in a week . Signs of peripheral neuropathy: numbness, tingling, or decreased feeling in fingers or toes; trouble walking or changes in the way you walk; or feeling clumsy when buttoning clothes, opening jars, or other routine activities . Joint and muscle pain that is not relieved by prescribed medicines . Extreme fatigue that interferes with normal activities . While you are getting this drug, please tell your nurse right away if you have any pain, redness, or swelling at the site of the IV infusion. . If you think you are pregnant.  Reproduction Warnings . Pregnancy warning: This drug may have harmful effects on the unborn child, it is recommended that effective methods of birth control should be used during your cancer treatment. Let your doctor know right away if you think you may be pregnant. . Breast feeding warning: Women should not breast feed during treatment because this drug could enter the breast milk and cause harm to a breast feeding baby.    SELF CARE ACTIVITIES WHILE ON CHEMOTHERAPY: Hydration Increase your fluid intake 48 hours prior to treatment and drink at least 8 to 12 cups (64 ounces) of water/decaff beverages per day after treatment. You can still have your cup of coffee or soda but these beverages do not count as part of your 8 to 12 cups that you need to drink daily. No alcohol intake.  Medications Continue taking your normal prescription medication as prescribed.  If you start any new herbal or new supplements please let us know first to make sure it is safe.  Mouth Care Have teeth cleaned professionally before starting treatment. Keep dentures and partial plates clean. Use soft toothbrush and do not use mouthwashes that contain alcohol. Biotene is a good mouthwash that is available at most pharmacies or may  be ordered by calling [phone]. Use warm salt water gargles (1 teaspoon salt per 1 quart warm water) before and after meals and at bedtime. Or you may rinse with 2 tablespoons of three-percent hydrogen peroxide mixed in eight ounces of water. If you are still having problems with your mouth or sores in your mouth please call the clinic.  If you need dental work, please let the doctor know before you go for your appointment so that we can coordinate the best possible time for you in regards to your chemo regimen. You need to also let your dentist know that you are actively taking chemo. We may need to do labs prior to your dental appointment.  Skin Care Always use sunscreen that has not expired and with SPF (Sun Protection Factor) of 50 or higher. Wear hats to protect your head from the sun. Remember to use sunscreen on your hands, ears, face, & feet.  Use good moisturizing lotions such as udder cream, eucerin, or even Vaseline. Some chemotherapies can cause dry skin, color changes in your skin and nails.    . Avoid long, hot showers or baths. . Use gentle, fragrance-free soaps and laundry detergent. . Use moisturizers, preferably creams or ointments rather than lotions because the thicker consistency is better at preventing skin dehydration. Apply the cream or ointment within 15 minutes of showering. Reapply moisturizer at night, and moisturize your hands every time after you wash them.  Hair Loss (if your doctor says your hair will fall out)  . If your doctor says that your hair is likely  to fall out, decide before you begin chemo whether you want to wear a wig. You may want to shop before treatment to match your hair color. . Hats, turbans, and scarves can also camouflage hair loss, although some people prefer to leave their heads uncovered. If you go bare-headed outdoors, be sure to use sunscreen on your scalp. . Cut your hair short. It eases the inconvenience of shedding lots of hair, but it  also can reduce the emotional impact of watching your hair fall out. . Don't perm or color your hair during chemotherapy. Those chemical treatments are already damaging to hair and can enhance hair loss. Once your chemo treatments are done and your hair has grown back, it's OK to resume dyeing or perming hair. With chemotherapy, hair loss is almost always temporary. But when it grows back, it may be a different color or texture. In older adults who still had hair color before chemotherapy, the new growth may be completely gray.  Often, new hair is very fine and soft.  Infection Prevention Please wash your hands for at least 30 seconds using warm soapy water. Handwashing is the #1 way to prevent the spread of germs. Stay away from sick people or people who are getting over a cold. If you develop respiratory systems such as green/yellow mucus production or productive cough or persistent cough let us know and we will see if you need an antibiotic. It is a good idea to keep a pair of gloves on when going into grocery stores/[employer] to decrease your risk of coming into contact with germs on the carts, etc. Carry alcohol hand gel with you at all times and use it frequently if out in public. If your temperature reaches 100.5 or higher please call the clinic and let us know.  If it is after hours or on the weekend please go to the ER if your temperature is over 100.5.  Please have your own personal thermometer at home to use.    Sex and bodily fluids If you are going to have sex, a condom must be used to protect the person that isn't taking chemotherapy. Chemo can decrease your libido (sex drive). For a few days after chemotherapy, chemotherapy can be excreted through your bodily fluids.  When using the toilet please close the lid and flush the toilet twice.  Do this for a few day after you have had chemotherapy.   Effects of chemotherapy on your sex life Some changes are simple and won't last long. They won't  affect your sex life permanently. Sometimes you may feel: . too tired . not strong enough to be very active . sick or sore  . not in the mood . anxious or low Your anxiety might not seem related to sex. For example, you may be worried about the cancer and how your treatment is going. Or you may be worried about money, or about how you family are coping with your illness. These things can cause stress, which can affect your interest in sex. It's important to talk to your partner about how you feel. Remember - the changes to your sex life don't usually last long. There's usually no medical reason to stop having sex during chemo. The drugs won't have any long term physical effects on your performance or enjoyment of sex. Cancer can't be passed on to your partner during sex  Contraception It's important to use reliable contraception during treatment. Avoid getting pregnant while you or your partner are having  chemotherapy. This is because the drugs may harm the baby. Sometimes chemotherapy drugs can leave a man or woman infertile.  This means you would not be able to have children in the future. You might want to talk to someone about permanent infertility. It can be very difficult to learn that you may no longer be able to have children. Some people find counselling helpful. There might be ways to preserve your fertility, although this is easier for men than for women. You may want to speak to a fertility expert. You can talk about sperm banking or harvesting your eggs. You can also ask about other fertility options, such as donor eggs. If you have or have had breast cancer, your doctor might advise you not to take the contraceptive pill. This is because the hormones in it might affect the cancer.  It is not known for sure whether or not chemotherapy drugs can be passed on through semen or secretions from the vagina. Because of this some doctors advise people to use a barrier method if you have sex  during treatment. This applies to vaginal, anal or oral sex. Generally, doctors advise a barrier method only for the time you are actually having the treatment and for about a week after your treatment. Advice like this can be worrying, but this does not mean that you have to avoid being intimate with your partner. You can still have close contact with your partner and continue to enjoy sex. Animals If you have cats or birds we just ask that you not change the litter or change the cage.  Please have someone else do this for you while you are on chemotherapy.   Food Safety During and After Cancer Treatment Food safety is important for people both during and after cancer treatment. Cancer and cancer treatments, such as chemotherapy, radiation therapy, and stem cell/bone marrow transplantation, often weaken the immune system. This makes it harder for your body to protect itself from foodborne illness, also called food poisoning. Foodborne illness is caused by eating food that contains harmful bacteria, parasites, or viruses.  Foods to avoid Some foods have a higher risk of becoming tainted with bacteria. These include: [place] Kitchen Unwashed fresh fruit and vegetables, especially leafy vegetables that can hide dirt and other contaminants . Raw sprouts, such as alfalfa sprouts . Raw or undercooked beef, especially ground beef, or other raw or undercooked meat and poultry . Fatty, fried, or spicy foods immediately before or after treatment.  These can sit heavy on your stomach and make you feel nauseous. . Raw or undercooked shellfish, such as oysters. . Sushi and sashimi, which often contain raw fish.  . Unpasteurized beverages, such as unpasteurized fruit juices, raw milk, raw yogurt, or cider . Undercooked eggs, such as soft boiled, over easy, and poached; raw, unpasteurized eggs; or foods made with raw egg, such as homemade raw cookie dough and homemade mayonnaise Simple steps for food safety Shop  smart. . Do not buy food stored or displayed in an unclean area. . Do not buy bruised or damaged fruits or vegetables. . Do not buy cans that have cracks, dents, or bulges. . Pick up foods that can spoil at the end of your shopping trip and store them in a cooler on the way home. Prepare and clean up foods carefully. . Rinse all fresh fruits and vegetables under running water, and dry them with a clean towel or paper towel. . Clean the top of cans before opening them. [place] Kitchen  After preparing food, wash your hands for 20 seconds with hot water and soap. Pay special attention to areas between fingers and under nails. . Clean your utensils and dishes with hot water and soap. [place] Kitchen Disinfect your kitchen and cutting boards using 1 teaspoon of liquid, unscented bleach mixed into 1 quart of water.   Dispose of old food. . Eat canned and packaged food before its expiration date (the "use by" or "best before" date). . Consume refrigerated leftovers within 3 to 4 days. After that time, throw out the food. Even if the food does not smell or look spoiled, it still may be unsafe. Some bacteria, such as Listeria, can grow even on foods stored in the refrigerator if they are kept for too long. Take precautions when eating out. . At restaurants, avoid buffets and salad bars where food sits out for a long time and comes in contact with many people. Food can become contaminated when someone with a virus, often a norovirus, or another "bug" handles it. . Put any leftover food in a "to-go" container yourself, rather than having the server do it. And, refrigerate leftovers as soon as you get home. . Choose restaurants that are clean and that are willing to prepare your food as you order it cooked.    MEDICATIONS:                                                                                                                    Zofran/Ondansetron 8mg  tablet. Take 1 tablet every 8 hours as needed for nausea/vomiting. (#1  nausea med to take, this can constipate)  Compazine/Prochlorperazine 10mg  tablet. Take 1 tablet every 6 hours as needed for nausea/vomiting. (#2 nausea med to take, this can make you sleepy) EMLA cream. Apply a quarter size amount to port site 1 hour prior to chemo. Do not rub in. Cover with plastic wrap.   Over-the-Counter Meds: Miralax 1 capful in 8 oz of fluid daily. May increase to two times a day if needed. This is a stool softener. If this doesn't work proceed you can add:  Senokot S-start with 1 tablet two times a day and increase to 4 tablets two times a day if needed. (total of 8 tablets in a 24 hour period). This is a stimulant laxative.   Call us if this does not help your bowels move.   Imodium 2mg  capsule. Take 2 capsules after the 1st loose stool and then 1 capsule every 2 hours until you go a total of 12 hours without having a loose stool. Call the [facility] if loose stools continue. If diarrhea occurs @ bedtime, take 2 capsules @ bedtime. Then take 2 capsules every 4 hours until morning. Call [facility].   Diarrhea Sheet  If you are having loose stools/diarrhea, please purchase Imodium and begin taking as outlined:  At the first sign of poorly formed or loose stools you should begin taking Imodium(loperamide) 2 mg capsules.  Take two caplets (4mg ) followed by one caplet (  2mg ) every 2 hours until you have had no diarrhea for 12 hours.  During the night take two caplets (4mg ) at bedtime and continue every 4 hours during the night until the morning.  Stop taking Imodium only after there is no sign of diarrhea for 12 hours.    Always call the [facility] if you are having loose stools/diarrhea that you can't get under control.  Loose stools/diarrhea leads to dehydration (loss of water) in your body.  We have other options of trying to get the loose stools/diarrhea to stopped but you must let us know!    Constipation Sheet *Miralax in 8 oz of fluid daily.  May increase to  two times a day if needed.  This is a stool softener.  If this not enough to keep your bowel regular:  You can add:  *Senokot S, start with one tablet twice a day and can increase to 4 tablets twice a day if needed.  This is a stimulant laxative.   Sometimes when you take pain medication you need BOTH a medicine to keep your stool soft and a medicine to help your bowel push it out!  Please call if the above does not work for you.   Do not go more than 2 days without a bowel movement.  It is very important that you do not become constipated.  It will make you feel sick to your stomach (nausea) and can cause abdominal pain and vomiting.   Nausea Sheet  Zofran/Ondansetron 8mg  tablet. Take 1 tablet every 8 hours as needed for nausea/vomiting. (#1 nausea med to take, this can constipate)  Compazine/Prochlorperazine 10mg  tablet. Take 1 tablet every 6 hours as needed for nausea/vomiting. (#2 nausea med to take, this can make you sleepy)  You can take these medications together or separately.  We would first like for you to try the Ondansetron by itself and then take the Prochloperizine if needed. But you are allowed to take both medications at the same time if your nausea is that severe.  If you are having persistent nausea (nausea that does not stop) please take these medications on a staggered schedule so that the nausea medication stays in your body.  Please call the [facility] and let us know the amount of nausea that you are experiencing.  If you begin to vomit, you need to call the [facility] and if it is the weekend and you have vomited more than one time and cant get it to stop-go to the Emergency Room.  Persistent nausea/vomiting can lead to dehydration (loss of fluid in your body) and will make you feel terrible.   Ice chips, sips of clear liquids, foods that are @ room temperature, crackers, and toast tend to be better tolerated.    SYMPTOMS TO REPORT AS SOON AS POSSIBLE AFTER  TREATMENT:  FEVER GREATER THAN 100.5 F  CHILLS WITH OR WITHOUT FEVER  NAUSEA AND VOMITING THAT IS NOT CONTROLLED WITH YOUR NAUSEA MEDICATION  UNUSUAL SHORTNESS OF BREATH  UNUSUAL BRUISING OR BLEEDING  TENDERNESS IN MOUTH AND THROAT WITH OR WITHOUT PRESENCE OF ULCERS  URINARY PROBLEMS  BOWEL PROBLEMS  UNUSUAL RASH    Wear comfortable clothing and clothing appropriate for easy access to any Portacath or PICC line. Let us know if there is anything that we can do to make your therapy better!    What to do if you need assistance after hours or on the weekends:CALL [phone].  HOLD on the line, do not  hang up.  You will hear multiple messages but at the end you will be connected with a nurse triage line.  They will contact the doctor if necessary.  Most of the time they will be able to assist you.  Do not call the hospital operator.    I have been informed and understand all of the instructions given to me and have received a copy. I have been instructed to call the clinic [phone] or my family physician as soon as possible for continued medical care, if indicated. I do not have any more questions at this time but understand that I may call the [facility] or the Patient Navigator at [phone] during office hours should I have questions or need assistance in obtaining follow-up care.

## 2017-05-23 NOTE — Patient Instructions (Signed)
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  We are going to treat you with carboplatin and taxol weekly concurrently with radiation.  This treatment is with curative intent.  You will see the doctor regularly throughout treatment.  We monitor your lab work prior to every treatment.  The doctor monitors your response to treatment by the way you are feeling, your blood work, and scans periodically.  There will be wait times while you are here for treatment.  It will take about 30 minutes to 1 hour for your lab work to result.  Then the pharmacy has to mix your medications and bring them to the clinic.    You will have the following premedications prior to receiving chemotherapy:  Premeds: Benadryl:  Help prevent a reaction to the chemotherapy.  Pepcid: antihistamine premed Aloxi - high powered nausea/vomiting prevention medication used for chemotherapy patients.  Dexamethasone - steroid - given to reduce the risk of you having an allergic type reaction to the chemotherapy. Dex can cause you to feel energized, nervous/anxious/jittery, make you have trouble sleeping, and/or make you feel hot/flushed in the face/neck and/or look pink/red in the face/neck. These side effects will pass as the Dex wears off. (takes 20 minutes to infuse)   POTENTIAL SIDE EFFECTS OF TREATMENT: Carboplatin (Generic Name) Other Names: Paraplatin, CBDCA  About This Drug Carboplatin is a drug used to treat cancer. This drug is given in the vein (IV). This drug takes about 30 minutes to infuse.    Possible Side Effects (More Common) . Nausea and throwing up (vomiting). These symptoms may happen within a few hours after your treatment and may last up to 24 hours. Medicines are available to stop or lessen these side effects. . Bone marrow depression. This is a decrease in the number of white blood cells, red blood cells, and  platelets. This may raise your risk of infection, make you tired and weak (fatigue), and raise your risk of bleeding. . Soreness of the mouth and throat. You may have red areas, white patches, or sores that hurt. . This drug may affect how your kidneys work. Your kidney function will be checked as needed. . Electrolyte changes. Your blood will be checked for electrolyte changes as needed.  Side Effects (Less Common) . Hair loss. Some patients lose their hair on the scalp and body. You may notice your hair thinning seven to 14 days after getting this drug. . Effects on the nerves are called peripheral neuropathy. You may feel numbness, tingling, or pain in your hands and feet. It may be hard for you to button your clothes, open jars, or walk as usual. The effect on the nerves may get worse with more doses of the drug. These effects get better in some people after the drug is stopped but it does not get better in all people. . Loose bowel movements (diarrhea) that may last for several days . Decreased hearing or ringing in the ears . Changes in the way food and drinks taste . Changes in liver function. Your liver function will be checked as needed.  Allergic Reactions Serious allergic reactions including anaphylaxis are rare. While you are getting this drug in your vein (IV), tell your nurse right away if you have any of these symptoms of an allergic reaction: . Trouble catching your breath . Feeling like your tongue or throat are swelling . Feeling your heart  beat quickly or in a not normal way (palpitations) . Feeling dizzy or lightheaded . Flushing, itching, rash, and/or hives  Treating Side Effects . Drink 6-8 cups of fluids each day unless your doctor has told you to limit your fluid intake due to some other health problem. A cup is 8 ounces of fluid. If you throw up or have loose bowel movements, you should drink more fluids so that you do not become dehydrated (lack water in the body from  losing too much fluid). . Mouth care is very important. Your mouth care should consist of routine, gentle cleaning of your teeth or dentures and rinsing your mouth with a mixture of 1/2 teaspoon of salt in 8 ounces of water or  teaspoon of baking soda in 8 ounces of water. This should be done at least after each meal and at bedtime. . If you have mouth sores, avoid mouthwash that has alcohol. Avoid alcohol and smoking because they can bother your mouth and throat. . If you have numbness and tingling in your hands and feet, be careful when cooking, walking, and handling sharp objects and hot liquids. . Talk with your nurse about getting a wig before you lose your hair. Also, call the [facility] at 800-ACS-2345 to find out information about the "Look Good, Feel Better" program close to where you live. It is a free program where women getting chemotherapy can learn about wigs, turbans and scarves as well as makeup techniques and skin and nail care.  Food and Drug Interactions There are no known interactions of carboplatin with food. This drug may interact with other medicines. Tell your doctor and pharmacist about all the medicines and dietary supplements (vitamins, minerals, herbs and others) that you are taking at this time. The safety and use of dietary supplements and alternative diets are often not known. Using these might affect your cancer or interfere with your treatment. Until more is known, you should not use dietary supplements or alternative diets without your doctor's help.  When to Call the Doctor Call your doctor or nurse right away if you have any of these symptoms: . Fever of 100.5 F (38 C) or above; chills . Bleeding or bruising that is not normal . Wheezing or trouble breathing . Nausea that stops you from eating or drinking . Throwing up more than once a day . Rash or itching . Loose bowel movements (diarrhea) more than four times a day or diarrhea with weakness or  feeling lightheaded . Call your doctor or nurse as soon as possible if any of these symptoms happen: . Numbness, tingling, decreased feeling or weakness in fingers, toes, arms, or legs . Change in hearing, ringing in the ears . Blurred vision or other changes in eyesight . Decreased urine . Yellowing of skin or eyes  Problems and Reproductive Concerns Sexual problems and reproduction concerns may happen. In both men and women, this drug may affect your ability to have children. This cannot be determined before your treatment. Talk with your doctor or nurse if you plan to have children. Ask for information on sperm or egg banking. In men, this drug may interfere with your ability to make sperm, but it should not change your ability to have sexual relations. In women, menstrual bleeding may become irregular or stop while you are getting this drug. Do not assume that you cannot become pregnant if you do not have a menstrual period. Women may go through signs of menopause (change of life)  like vaginal dryness or itching. Vaginal lubricants can be used to lessen vaginal dryness, itching, and pain during sexual relations. Genetic counseling is available for you to talk about the effects of this drug therapy on future pregnancies. Also, a genetic counselor can look at the possible risk of problems in the unborn baby due to this medicine if an exposure happens during pregnancy. . Pregnancy warning: This drug may have harmful effects on the unborn child, so effective methods of birth control should be used during your cancer treatment. . Breast feeding warning: It is not known if this drug passes into breast milk. For this reason, women should talk to their doctor about the risks and benefits of breast feeding during treatment with this drug because this drug may enter the breast milk and badly harm a breast feeding baby.   Paclitaxel (Taxol)  Paclitaxel is a drug used to treat cancer. It is given in the  vein (IV).  This will take 1 hour to infuse.  The first time this drug is given it will take longer to infuse because it is increased slowly to monitor for reactions.  The nurse will be in the room with you for the first 15 minutes.   Possible Side Effects . Hair loss. Hair loss is often temporary, although with certain medicine, hair loss can sometimes be permanent. Hair loss may happen suddenly or gradually. If you lose hair, you may lose it from your head, face, armpits, pubic area, chest, and/or legs. You may also notice your hair getting thin. . Swelling of your legs, ankles and/or feet (edema) . Flushing . Nausea and throwing up (vomiting) . Loose bowel movements (diarrhea) . Bone marrow depression. This is a decrease in the number of white blood cells, red blood cells, and platelets. This may raise your risk of infection, make you tired and weak (fatigue), and raise your risk of bleeding. . Effects on the nerves are called peripheral neuropathy. You may feel numbness, tingling, or pain in your hands and feet. It may be hard for you to button your clothes, open jars, or walk as usual. The effect on the nerves may get worse with more doses of the drug. These effects get better in some people after the drug is stopped but it does not get better in all people. . Changes in your liver function . Bone, joint and muscle pain . Abnormal EKG . Allergic reaction: Allergic reactions, including anaphylaxis are rare but may happen in some patients. Signs of allergic reaction to this drug may be swelling of the face, feeling like your tongue or throat are swelling, trouble breathing, rash, itching, fever, chills, feeling dizzy, and/or feeling that your heart is beating in a fast or not normal way. If this happens, do not take another dose of this drug. You should get urgent medical treatment. . Infection . Changes in your kidney function. Note: Each of the side effects above was reported in 20% or greater  of patients treated with paclitaxel. Not all possible side effects are included above.  Warnings and Precautions . Severe bone marrow depression  Side Effects . To help with hair loss, wash with a mild shampoo and avoid washing your hair every day. . Avoid rubbing your scalp, instead, pat your hair or scalp dry . Avoid coloring your hair . Limit your use of hair spray, electric curlers, blow dryers, and curling irons. . If you are interested in getting a wig, talk to your nurse. You can also  call the [facility] at 800-ACS-2345 to find out information about the "Look Good, Feel Better" program close to where you live. It is a free program where women getting chemotherapy can learn about wigs, turbans and scarves as well as makeup techniques and skin and nail care. . Ask your doctor or nurse about medicines that are available to help stop or lessen diarrhea and/or nausea. . To help with nausea and vomiting, eat small, frequent meals instead of three large meals a day. Choose foods and drinks that are at room temperature. Ask your nurse or doctor about other helpful tips and medicine that is available to help or stop lessen these symptoms. . If you get diarrhea, eat low-fiber foods that are high in protein and calories and avoid foods that can irritate your digestive tracts or lead to cramping. Ask your nurse or doctor about medicine that can lessen or stop your diarrhea. . Mouth care is very important. Your mouth care should consist of routine, gentle cleaning of your teeth or dentures and rinsing your mouth with a mixture of 1/2 teaspoon of salt in 8 ounces of water or  teaspoon of baking soda in 8 ounces of water. This should be done at least after each meal and at bedtime. . If you have mouth sores, avoid mouthwash that has alcohol. Also avoid alcohol and smoking because they can bother your mouth and throat. . Drink plenty of fluids (a minimum of eight glasses per day is  recommended). . Take your temperature as your doctor or nurse tells you, and whenever you feel like you may have a fever. . Talk to your doctor or nurse about precautions you can take to avoid infections and bleeding. . Be careful when cooking, walking, and handling sharp objects and hot liquids.  Food and Drug Interactions . There are no known interactions of paclitaxel with food. . This drug may interact with other medicines. Tell your doctor and pharmacist about all the medicines and dietary supplements (vitamins, minerals, herbs and others) that you are taking at this time. . The safety and use of dietary supplements and alternative diets are often not known. Using these might affect your cancer or interfere with your treatment. Until more is known, you should not use dietary supplements or alternative diets without your cancer doctor's help.  When to Call the Doctor Call your doctor or nurse if you have any of the following symptoms and/or any new or unusual symptoms: . Fever of 100.5 F (38 C) or above . Chills . Redness, pain, warmth, or swelling at the IV site during the infusion . Signs of allergic reaction: swelling of the face, feeling like your tongue or throat are swelling, trouble breathing, rash, itching, fever, chills, feeling dizzy, and/or feeling that your heart is beating in a fast or not normal way . Feeling that your heart is beating in a fast or not normal way (palpitations) . Weight gain of 5 pounds in one week (fluid retention) . Decreased urine or very dark urine . Signs of liver problems: dark urine, pale bowel movements, bad stomach pain, feeling very tired and weak, unusual itching, or yellowing of the eyes or skin . Heavy menstrual period that lasts longer than normal . Easy bruising or bleeding . Nausea that stops you from eating or drinking, and/or that is not relieved by prescribed medicines. . Loose bowel movements (diarrhea) more than 4 times a day or diarrhea  with weakness or lightheadedness . Pain in your  mouth or throat that makes it hard to eat or drink . Lasting loss of appetite or rapid weight loss of five pounds in a week . Signs of peripheral neuropathy: numbness, tingling, or decreased feeling in fingers or toes; trouble walking or changes in the way you walk; or feeling clumsy when buttoning clothes, opening jars, or other routine activities . Joint and muscle pain that is not relieved by prescribed medicines . Extreme fatigue that interferes with normal activities . While you are getting this drug, please tell your nurse right away if you have any pain, redness, or swelling at the site of the IV infusion. . If you think you are pregnant.  Reproduction Warnings . Pregnancy warning: This drug may have harmful effects on the unborn child, it is recommended that effective methods of birth control should be used during your cancer treatment. Let your doctor know right away if you think you may be pregnant. . Breast feeding warning: Women should not breast feed during treatment because this drug could enter the breast milk and cause harm to a breast feeding baby.    SELF CARE ACTIVITIES WHILE ON CHEMOTHERAPY: Hydration Increase your fluid intake 48 hours prior to treatment and drink at least 8 to 12 cups (64 ounces) of water/decaff beverages per day after treatment. You can still have your cup of coffee or soda but these beverages do not count as part of your 8 to 12 cups that you need to drink daily. No alcohol intake.  Medications Continue taking your normal prescription medication as prescribed.  If you start any new herbal or new supplements please let us know first to make sure it is safe.  Mouth Care Have teeth cleaned professionally before starting treatment. Keep dentures and partial plates clean. Use soft toothbrush and do not use mouthwashes that contain alcohol. Biotene is a good mouthwash that is available at most pharmacies or may  be ordered by calling [phone]. Use warm salt water gargles (1 teaspoon salt per 1 quart warm water) before and after meals and at bedtime. Or you may rinse with 2 tablespoons of three-percent hydrogen peroxide mixed in eight ounces of water. If you are still having problems with your mouth or sores in your mouth please call the clinic.  If you need dental work, please let the doctor know before you go for your appointment so that we can coordinate the best possible time for you in regards to your chemo regimen. You need to also let your dentist know that you are actively taking chemo. We may need to do labs prior to your dental appointment.  Skin Care Always use sunscreen that has not expired and with SPF (Sun Protection Factor) of 50 or higher. Wear hats to protect your head from the sun. Remember to use sunscreen on your hands, ears, face, & feet.  Use good moisturizing lotions such as udder cream, eucerin, or even Vaseline. Some chemotherapies can cause dry skin, color changes in your skin and nails.    . Avoid long, hot showers or baths. . Use gentle, fragrance-free soaps and laundry detergent. . Use moisturizers, preferably creams or ointments rather than lotions because the thicker consistency is better at preventing skin dehydration. Apply the cream or ointment within 15 minutes of showering. Reapply moisturizer at night, and moisturize your hands every time after you wash them.  Hair Loss (if your doctor says your hair will fall out)  . If your doctor says that your hair is likely  to fall out, decide before you begin chemo whether you want to wear a wig. You may want to shop before treatment to match your hair color. . Hats, turbans, and scarves can also camouflage hair loss, although some people prefer to leave their heads uncovered. If you go bare-headed outdoors, be sure to use sunscreen on your scalp. . Cut your hair short. It eases the inconvenience of shedding lots of hair, but it  also can reduce the emotional impact of watching your hair fall out. . Don't perm or color your hair during chemotherapy. Those chemical treatments are already damaging to hair and can enhance hair loss. Once your chemo treatments are done and your hair has grown back, it's OK to resume dyeing or perming hair. With chemotherapy, hair loss is almost always temporary. But when it grows back, it may be a different color or texture. In older adults who still had hair color before chemotherapy, the new growth may be completely gray.  Often, new hair is very fine and soft.  Infection Prevention Please wash your hands for at least 30 seconds using warm soapy water. Handwashing is the #1 way to prevent the spread of germs. Stay away from sick people or people who are getting over a cold. If you develop respiratory systems such as green/yellow mucus production or productive cough or persistent cough let us know and we will see if you need an antibiotic. It is a good idea to keep a pair of gloves on when going into grocery stores/[employer] to decrease your risk of coming into contact with germs on the carts, etc. Carry alcohol hand gel with you at all times and use it frequently if out in public. If your temperature reaches 100.5 or higher please call the clinic and let us know.  If it is after hours or on the weekend please go to the ER if your temperature is over 100.5.  Please have your own personal thermometer at home to use.    Sex and bodily fluids If you are going to have sex, a condom must be used to protect the person that isn't taking chemotherapy. Chemo can decrease your libido (sex drive). For a few days after chemotherapy, chemotherapy can be excreted through your bodily fluids.  When using the toilet please close the lid and flush the toilet twice.  Do this for a few day after you have had chemotherapy.   Effects of chemotherapy on your sex life Some changes are simple and won't last long. They won't  affect your sex life permanently. Sometimes you may feel: . too tired . not strong enough to be very active . sick or sore  . not in the mood . anxious or low Your anxiety might not seem related to sex. For example, you may be worried about the cancer and how your treatment is going. Or you may be worried about money, or about how you family are coping with your illness. These things can cause stress, which can affect your interest in sex. It's important to talk to your partner about how you feel. Remember - the changes to your sex life don't usually last long. There's usually no medical reason to stop having sex during chemo. The drugs won't have any long term physical effects on your performance or enjoyment of sex. Cancer can't be passed on to your partner during sex  Contraception It's important to use reliable contraception during treatment. Avoid getting pregnant while you or your partner are having  chemotherapy. This is because the drugs may harm the baby. Sometimes chemotherapy drugs can leave a man or woman infertile.  This means you would not be able to have children in the future. You might want to talk to someone about permanent infertility. It can be very difficult to learn that you may no longer be able to have children. Some people find counselling helpful. There might be ways to preserve your fertility, although this is easier for men than for women. You may want to speak to a fertility expert. You can talk about sperm banking or harvesting your eggs. You can also ask about other fertility options, such as donor eggs. If you have or have had breast cancer, your doctor might advise you not to take the contraceptive pill. This is because the hormones in it might affect the cancer.  It is not known for sure whether or not chemotherapy drugs can be passed on through semen or secretions from the vagina. Because of this some doctors advise people to use a barrier method if you have sex  during treatment. This applies to vaginal, anal or oral sex. Generally, doctors advise a barrier method only for the time you are actually having the treatment and for about a week after your treatment. Advice like this can be worrying, but this does not mean that you have to avoid being intimate with your partner. You can still have close contact with your partner and continue to enjoy sex. Animals If you have cats or birds we just ask that you not change the litter or change the cage.  Please have someone else do this for you while you are on chemotherapy.   Food Safety During and After Cancer Treatment Food safety is important for people both during and after cancer treatment. Cancer and cancer treatments, such as chemotherapy, radiation therapy, and stem cell/bone marrow transplantation, often weaken the immune system. This makes it harder for your body to protect itself from foodborne illness, also called food poisoning. Foodborne illness is caused by eating food that contains harmful bacteria, parasites, or viruses.  Foods to avoid Some foods have a higher risk of becoming tainted with bacteria. These include: Marland Kitchen Unwashed fresh fruit and vegetables, especially leafy vegetables that can hide dirt and other contaminants . Raw sprouts, such as alfalfa sprouts . Raw or undercooked beef, especially ground beef, or other raw or undercooked meat and poultry . Fatty, fried, or spicy foods immediately before or after treatment.  These can sit heavy on your stomach and make you feel nauseous. . Raw or undercooked shellfish, such as oysters. . Sushi and sashimi, which often contain raw fish.  . Unpasteurized beverages, such as unpasteurized fruit juices, raw milk, raw yogurt, or cider . Undercooked eggs, such as soft boiled, over easy, and poached; raw, unpasteurized eggs; or foods made with raw egg, such as homemade raw cookie dough and homemade mayonnaise Simple steps for food safety Shop  smart. . Do not buy food stored or displayed in an unclean area. . Do not buy bruised or damaged fruits or vegetables. . Do not buy cans that have cracks, dents, or bulges. . Pick up foods that can spoil at the end of your shopping trip and store them in a cooler on the way home. Prepare and clean up foods carefully. . Rinse all fresh fruits and vegetables under running water, and dry them with a clean towel or paper towel. . Clean the top of cans before opening them. Marland Kitchen  After preparing food, wash your hands for 20 seconds with hot water and soap. Pay special attention to areas between fingers and under nails. . Clean your utensils and dishes with hot water and soap. Marland Kitchen Disinfect your kitchen and cutting boards using 1 teaspoon of liquid, unscented bleach mixed into 1 quart of water.   Dispose of old food. . Eat canned and packaged food before its expiration date (the "use by" or "best before" date). . Consume refrigerated leftovers within 3 to 4 days. After that time, throw out the food. Even if the food does not smell or look spoiled, it still may be unsafe. Some bacteria, such as Listeria, can grow even on foods stored in the refrigerator if they are kept for too long. Take precautions when eating out. . At restaurants, avoid buffets and salad bars where food sits out for a long time and comes in contact with many people. Food can become contaminated when someone with a virus, often a norovirus, or another "bug" handles it. . Put any leftover food in a "to-go" container yourself, rather than having the server do it. And, refrigerate leftovers as soon as you get home. . Choose restaurants that are clean and that are willing to prepare your food as you order it cooked.    MEDICATIONS:                                                                                                                    Zofran/Ondansetron 8mg  tablet. Take 1 tablet every 8 hours as needed for nausea/vomiting. (#1  nausea med to take, this can constipate)  Compazine/Prochlorperazine 10mg  tablet. Take 1 tablet every 6 hours as needed for nausea/vomiting. (#2 nausea med to take, this can make you sleepy) EMLA cream. Apply a quarter size amount to port site 1 hour prior to chemo. Do not rub in. Cover with plastic wrap.   Over-the-Counter Meds: Miralax 1 capful in 8 oz of fluid daily. May increase to two times a day if needed. This is a stool softener. If this doesn't work proceed you can add:  Senokot S-start with 1 tablet two times a day and increase to 4 tablets two times a day if needed. (total of 8 tablets in a 24 hour period). This is a stimulant laxative.   Call us if this does not help your bowels move.   Imodium 2mg  capsule. Take 2 capsules after the 1st loose stool and then 1 capsule every 2 hours until you go a total of 12 hours without having a loose stool. Call the [facility] if loose stools continue. If diarrhea occurs @ bedtime, take 2 capsules @ bedtime. Then take 2 capsules every 4 hours until morning. Call [facility].   Diarrhea Sheet  If you are having loose stools/diarrhea, please purchase Imodium and begin taking as outlined:  At the first sign of poorly formed or loose stools you should begin taking Imodium(loperamide) 2 mg capsules.  Take two caplets (4mg ) followed by one caplet (  2mg ) every 2 hours until you have had no diarrhea for 12 hours.  During the night take two caplets (4mg ) at bedtime and continue every 4 hours during the night until the morning.  Stop taking Imodium only after there is no sign of diarrhea for 12 hours.    Always call the [facility] if you are having loose stools/diarrhea that you can't get under control.  Loose stools/diarrhea leads to dehydration (loss of water) in your body.  We have other options of trying to get the loose stools/diarrhea to stopped but you must let us know!    Constipation Sheet *Miralax in 8 oz of fluid daily.  May increase to  two times a day if needed.  This is a stool softener.  If this not enough to keep your bowel regular:  You can add:  *Senokot S, start with one tablet twice a day and can increase to 4 tablets twice a day if needed.  This is a stimulant laxative.   Sometimes when you take pain medication you need BOTH a medicine to keep your stool soft and a medicine to help your bowel push it out!  Please call if the above does not work for you.   Do not go more than 2 days without a bowel movement.  It is very important that you do not become constipated.  It will make you feel sick to your stomach (nausea) and can cause abdominal pain and vomiting.   Nausea Sheet  Zofran/Ondansetron 8mg  tablet. Take 1 tablet every 8 hours as needed for nausea/vomiting. (#1 nausea med to take, this can constipate)  Compazine/Prochlorperazine 10mg  tablet. Take 1 tablet every 6 hours as needed for nausea/vomiting. (#2 nausea med to take, this can make you sleepy)  You can take these medications together or separately.  We would first like for you to try the Ondansetron by itself and then take the Prochloperizine if needed. But you are allowed to take both medications at the same time if your nausea is that severe.  If you are having persistent nausea (nausea that does not stop) please take these medications on a staggered schedule so that the nausea medication stays in your body.  Please call the [facility] and let us know the amount of nausea that you are experiencing.  If you begin to vomit, you need to call the [facility] and if it is the weekend and you have vomited more than one time and cant get it to stop-go to the Emergency Room.  Persistent nausea/vomiting can lead to dehydration (loss of fluid in your body) and will make you feel terrible.   Ice chips, sips of clear liquids, foods that are @ room temperature, crackers, and toast tend to be better tolerated.    SYMPTOMS TO REPORT AS SOON AS POSSIBLE AFTER  TREATMENT:  FEVER GREATER THAN 100.5 F  CHILLS WITH OR WITHOUT FEVER  NAUSEA AND VOMITING THAT IS NOT CONTROLLED WITH YOUR NAUSEA MEDICATION  UNUSUAL SHORTNESS OF BREATH  UNUSUAL BRUISING OR BLEEDING  TENDERNESS IN MOUTH AND THROAT WITH OR WITHOUT PRESENCE OF ULCERS  URINARY PROBLEMS  BOWEL PROBLEMS  UNUSUAL RASH    Wear comfortable clothing and clothing appropriate for easy access to any Portacath or PICC line. Let us know if there is anything that we can do to make your therapy better!    What to do if you need assistance after hours or on the weekends:CALL [phone].  HOLD on the line, do not  hang up.  You will hear multiple messages but at the end you will be connected with a nurse triage line.  They will contact the doctor if necessary.  Most of the time they will be able to assist you.  Do not call the hospital operator.    I have been informed and understand all of the instructions given to me and have received a copy. I have been instructed to call the clinic [phone] or my family physician as soon as possible for continued medical care, if indicated. I do not have any more questions at this time but understand that I may call the [facility] or the Patient Navigator at [phone] during office hours should I have questions or need assistance in obtaining follow-up care.

## 2017-05-24 DIAGNOSIS — C3492 Malignant neoplasm of unspecified part of left bronchus or lung: Secondary | ICD-10-CM | POA: Diagnosis not present

## 2017-05-24 DIAGNOSIS — C3411 Malignant neoplasm of upper lobe, right bronchus or lung: Secondary | ICD-10-CM | POA: Diagnosis not present

## 2017-05-25 ENCOUNTER — Other Ambulatory Visit (HOSPITAL_COMMUNITY): Payer: PPO

## 2017-05-25 ENCOUNTER — Ambulatory Visit (HOSPITAL_COMMUNITY): Payer: PPO | Admitting: Internal Medicine

## 2017-05-29 ENCOUNTER — Ambulatory Visit (HOSPITAL_COMMUNITY): Payer: PPO

## 2017-05-30 DIAGNOSIS — C3490 Malignant neoplasm of unspecified part of unspecified bronchus or lung: Secondary | ICD-10-CM | POA: Diagnosis not present

## 2017-05-30 DIAGNOSIS — C3411 Malignant neoplasm of upper lobe, right bronchus or lung: Secondary | ICD-10-CM | POA: Diagnosis not present

## 2017-05-31 ENCOUNTER — Ambulatory Visit (INDEPENDENT_AMBULATORY_CARE_PROVIDER_SITE_OTHER): Payer: PPO | Admitting: General Surgery

## 2017-05-31 ENCOUNTER — Inpatient Hospital Stay (HOSPITAL_COMMUNITY): Payer: PPO | Attending: Internal Medicine

## 2017-05-31 ENCOUNTER — Encounter: Payer: Self-pay | Admitting: General Surgery

## 2017-05-31 VITALS — BP 176/86 | HR 87 | Temp 99.1°F | Ht 69.0 in | Wt 177.0 lb

## 2017-05-31 DIAGNOSIS — Z5111 Encounter for antineoplastic chemotherapy: Secondary | ICD-10-CM | POA: Insufficient documentation

## 2017-05-31 DIAGNOSIS — C3490 Malignant neoplasm of unspecified part of unspecified bronchus or lung: Secondary | ICD-10-CM | POA: Diagnosis not present

## 2017-05-31 DIAGNOSIS — C3411 Malignant neoplasm of upper lobe, right bronchus or lung: Secondary | ICD-10-CM | POA: Insufficient documentation

## 2017-05-31 NOTE — H&P (View-Only) (Signed)
Ian Aguilar; [id number]; 12/16/1940   HPI Patient is a 77-year-old white male who was referred to my care by Oncology/[facility]olding for placement of a Port-A-Cath.  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  He is to start treatment next week.  Oncology needs central venous access.  Patient currently has no pain. Past Medical History:  Diagnosis Date  . Anxiety   . Arthritis   . Cirrhosis (HCC)    confirmed by MRI on 07/04/11.  no immunizations  . COPD (chronic obstructive pulmonary disease) (HCC)   . Depression with anxiety   . ETOH abuse   . Hyperlipidemia   . Nodule of upper lobe of right lung    with mediastinal adenopathy  . Wears dentures   . Wears glasses     Past Surgical History:  Procedure Laterality Date  . bilateral cataract surgery     [place]  . broken arm  6 yrs ago   left otif of wrist-[facility]  . CLOSED REDUCTION WRIST FRACTURE Right 09/02/2015   Procedure: RIGHT WRIST REDUCTION;  Surgeon: Timothy D Murphy, MD;  [facility]: [facility];  Service: Orthopedics;  Laterality: Right;  with MAC  . COLONOSCOPY  1996   [facility]: external hemorrhoids, no polyps  . COLONOSCOPY  06/28/2011   Dr. Rourk-sigmoid diverticulosis,tubular adenoma, hyperplastic polyp  . COLONOSCOPY WITH PROPOFOL N/A 04/26/2017   Procedure: COLONOSCOPY WITH PROPOFOL;  Surgeon: Rourk, Robert M, MD;  [facility];  Service: Endoscopy;  Laterality: N/A;  2:00PM - spoke with pt's wife, knows to arrive at 11:45  . ESOPHAGOGASTRODUODENOSCOPY  06/28/2011   Dr. Rourk-mild erosive reflux esophagitis, hiatal hernia-gastritis  . ESOPHAGOGASTRODUODENOSCOPY (EGD) WITH PROPOFOL N/A 04/26/2017   Procedure: ESOPHAGOGASTRODUODENOSCOPY (EGD) WITH PROPOFOL;  Surgeon: Rourk, Robert M, MD;  [facility];  Service: Endoscopy;  Laterality: N/A;  . EXTERNAL EAR SURGERY     right ear-cleaned out ear and created new eardrum  . INGUINAL HERNIA REPAIR  2-3 yrs ago   left-[facility]  .  INTRAMEDULLARY (IM) NAIL INTERTROCHANTERIC Right 09/02/2015   Procedure: RIGHT  INTERTROCHANTRIC HIP;  Surgeon: Timothy D Murphy, MD;  Location: [facility];  Service: Orthopedics;  Laterality: Right;  With MAC  . KNEE SURGERY     left-arthroscopy-[place]  . SKULL FRACTURE ELEVATION     fractured cheeck bone repaired  . TOTAL HIP ARTHROPLASTY  12/18/2011   Procedure: TOTAL HIP ARTHROPLASTY;  Surgeon: Stanley E Harrison, MD;  Location: [facility];  Service: Orthopedics;  Laterality: Left;  . VIDEO BRONCHOSCOPY WITH ENDOBRONCHIAL ULTRASOUND N/A 05/04/2017   Procedure: VIDEO BRONCHOSCOPY WITH ENDOBRONCHIAL ULTRASOUND;  Surgeon: Hendrickson, Steven C, MD;  Location: [facility];  Service: Thoracic;  Laterality: N/A;  . WISDOM TOOTH EXTRACTION      Family History  Problem Relation Age of Onset  . Stroke Mother 73  . Heart disease Father 92       MI  . Diabetes Maternal Uncle   . Colon cancer Neg Hx   . Cancer Neg Hx     Current Outpatient Medications on File Prior to Visit  Medication Sig Dispense Refill  . CARBOPLATIN IV Inject into the vein. Weekly during radiation    . cholecalciferol (VITAMIN D) 1000 units tablet Take 1,000 Units by mouth daily.    . lidocaine-prilocaine (EMLA) cream Apply a quarter size amount to affected area 1 hour prior to coming to chemotherapy.  Do not rub in.  Cover with plastic wrap. 30 g 2  .   Nutritional Supplements (ENSURE ENLIVE PO) Take 237 mLs by mouth daily at 2 PM.     . ondansetron (ZOFRAN) 8 MG tablet Take 1 tablet (8 mg total) by mouth every 8 (eight) hours as needed for nausea or vomiting. 30 tablet 2  . PACLitaxel (TAXOL IV) Inject into the vein. Weekly during radiation    . PROAIR HFA 108 (90 Base) MCG/ACT inhaler Inhale 2 puffs into the lungs every 6 (six) hours as needed (for shortness of breath/wheezing/cough).   2  . prochlorperazine (COMPAZINE) 10 MG tablet Take 1 tablet (10 mg total) by mouth every 6 (six) hours as needed for nausea or vomiting. 30 tablet 2  .  Propylene Glycol-Glycerin (SOOTHE) 0.6-0.6 % SOLN Place 1-2 drops into both eyes 3 (three) times daily as needed (for dry/irritated eyes.).    . Skin Protectants, Misc. (EUCERIN) cream Apply 1 application topically 4 (four) times daily as needed for dry skin (for itchy or dry skin).    . Tiotropium Bromide Monohydrate (SPIRIVA RESPIMAT) 2.5 MCG/ACT AERS Inhale 2 puffs into the lungs every evening.      No current facility-administered medications on file prior to visit.     No Known Allergies  Social History   Substance and Sexual Activity  Alcohol Use Yes   Comment: history of ETOH abuse, stopped 1.5 years ago (had one drink at [place] [employer]).     Social History   Tobacco Use  Smoking Status Current Every Day Smoker  . Packs/day: 0.50  . Years: 62.00  . Pack years: 31.00  . Types: Cigarettes  Smokeless Tobacco Never Used  Tobacco Comment   1/2 ppd > 60 years    Review of Systems  Constitutional: Negative.   HENT: Negative.   Eyes: Negative.   Respiratory: Positive for shortness of breath.   Cardiovascular: Negative.   Gastrointestinal: Negative.   Genitourinary: Negative.   Musculoskeletal: Negative.   Skin: Negative.   Neurological: Negative.   Endo/Heme/Allergies: Negative.   Psychiatric/Behavioral: Negative.     Objective   Vitals:   05/31/17 1103  BP: (!) 176/86  Pulse: 87  Temp: 99.1 F (37.3 C)    Physical Exam  Constitutional: He is oriented to person, place, and time and well-developed, well-nourished, and in no distress.  HENT:  Head: Normocephalic and atraumatic.  Cardiovascular: Normal rate, regular rhythm and normal heart sounds. Exam reveals no gallop and no friction rub.  No murmur heard. Pulmonary/Chest: Effort normal and breath sounds normal. No respiratory distress. He has no wheezes. He has no rales.  Neurological: He is alert and oriented to person, place, and time.  Skin: Skin is warm and dry.  Vitals reviewed. Oncology notes  reviewed.  Assessment  Small cell carcinoma of right lung, need for central venous access  Plan   Patient is scheduled for Port-A-Cath insertion on 06/06/2017.  The risks and benefits of the procedure including bleeding, infection, and pneumothorax were fully explained to the patient, who gave informed consent. 

## 2017-05-31 NOTE — Patient Instructions (Signed)
Implanted Port Insertion  Implanted port insertion is a procedure to put in a port and catheter. The port is a device with an injectable disk that can be accessed by your health care provider. The port is connected to a vein in the chest or neck by a small flexible tube (catheter). There are different types of ports. The implanted port may be used as a long-term IV access for:  · Medicines, such as chemotherapy.  · Fluids.  · Liquid nutrition, such as total parenteral nutrition (TPN).  · Blood samples.    Having a port means that your health care provider will not need to use the veins in your arms for these procedures.  Tell a health care provider about:  · Any allergies you have.  · All medicines you are taking, especially blood thinners, as well as any vitamins, herbs, eye drops, creams, over-the-counter medicines, and steroids.  · Any problems you or family members have had with anesthetic medicines.  · Any blood disorders you have.  · Any surgeries you have had.  · Any medical conditions you have, including diabetes or kidney problems.  · Whether you are pregnant or may be pregnant.  What are the risks?  Generally, this is a safe procedure. However, problems may occur, including:  · Allergic reactions to medicines or dyes.  · Damage to other structures or organs.  · Infection.  · Damage to the blood vessel, bruising, or bleeding at the puncture site.  · Blood clot.  · Breakdown of the skin over the port.  · A collection of air in the chest that can cause one of the lungs to collapse (pneumothorax). This is rare.    What happens before the procedure?  Staying hydrated  Follow instructions from your health care provider about hydration, which may include:  · Up to 2 hours before the procedure - you may continue to drink clear liquids, such as water, clear fruit juice, black coffee, and plain tea.    Eating and drinking restrictions  · Follow instructions from your health care provider about eating and drinking,  which may include:  ? 8 hours before the procedure - stop eating heavy meals or foods such as meat, fried foods, or fatty foods.  ? 6 hours before the procedure - stop eating light meals or foods, such as toast or cereal.  ? 6 hours before the procedure - stop drinking milk or drinks that contain milk.  ? 2 hours before the procedure - stop drinking clear liquids.  Medicines  · Ask your health care provider about:  ? Changing or stopping your regular medicines. This is especially important if you are taking diabetes medicines or blood thinners.  ? Taking medicines such as aspirin and ibuprofen. These medicines can thin your blood. Do not take these medicines before your procedure if your health care provider instructs you not to.  · You may be given antibiotic medicine to help prevent infection.  General instructions  · Plan to have someone take you home from the hospital or clinic.  · If you will be going home right after the procedure, plan to have someone with you for 24 hours.  · You may have blood tests.  · You may be asked to shower with a germ-killing soap.  What happens during the procedure?  · To lower your risk of infection:  ? Your health care team will wash or sanitize their hands.  ? Your skin will be washed with   soap.  ? Hair may be removed from the surgical area.  · An IV tube will be inserted into one of your veins.  · You will be given one or more of the following:  ? A medicine to help you relax (sedative).  ? A medicine to numb the area (local anesthetic).  · Two small cuts (incisions) will be made to insert the port.  ? One incision will be made in your neck to get access to the vein where the catheter will lie.  ? The other incision will be made in the upper chest. This is where the port will lie.  · The procedure may be done using continuous X-ray (fluoroscopy) or other imaging tools for guidance.  · The port and catheter will be placed. There may be a small, raised area where the port  is.  · The port will be flushed with a salt solution (saline), and blood will be drawn to make sure that it is working correctly.  · The incisions will be closed.  · Bandages (dressings) may be placed over the incisions.  The procedure may vary among health care providers and hospitals.  What happens after the procedure?  · Your blood pressure, heart rate, breathing rate, and blood oxygen level will be monitored until the medicines you were given have worn off.  · Do not drive for 24 hours if you were given a sedative.  · You will be given a manufacturer's information card for the type of port that you have. Keep this with you.  · Your port will need to be flushed and checked as told by your health care provider, usually every few weeks.  · A chest X-ray will be done to:  ? Check the placement of the port.  ? Make sure there is no injury to your lung.  Summary  · Implanted port insertion is a procedure to put in a port and catheter.  · The implanted port is used as a long-term IV access.  · The port will need to be flushed and checked as told by your health care provider, usually every few weeks.  · Keep your manufacturer's information card with you at all times.  This information is not intended to replace advice given to you by your health care provider. Make sure you discuss any questions you have with your health care provider.  Document Released: 12/04/2012 Document Revised: 01/05/2016 Document Reviewed: 01/05/2016  Elsevier Interactive Patient Education © 2017 [employer]

## 2017-05-31 NOTE — H&P (Signed)
Ian Aguilar; [id number]; 12/12/1940   HPI Patient is a 77-year-old white male who was referred to my care by Oncology/[facility] for placement of a Port-A-Cath.  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  He is to start treatment next week.  Oncology needs central venous access.  Patient currently has no pain. Past Medical History:  Diagnosis Date  . Anxiety   . Arthritis   . Cirrhosis (HCC)    confirmed by MRI on 07/04/11.  no immunizations  . COPD (chronic obstructive pulmonary disease) (HCC)   . Depression with anxiety   . ETOH abuse   . Hyperlipidemia   . Nodule of upper lobe of right lung    with mediastinal adenopathy  . Wears dentures   . Wears glasses     Past Surgical History:  Procedure Laterality Date  . bilateral cataract surgery     [place]  . broken arm  6 yrs ago   left otif of wrist-[facility]  . CLOSED REDUCTION WRIST FRACTURE Right 09/02/2015   Procedure: RIGHT WRIST REDUCTION;  Surgeon: Timothy D Murphy, MD;  [facility];  Service: Orthopedics;  Laterality: Right;  with MAC  . COLONOSCOPY  1996   [facility]: external hemorrhoids, no polyps  . COLONOSCOPY  06/28/2011   Dr. Rourk-sigmoid diverticulosis,tubular adenoma, hyperplastic polyp  . COLONOSCOPY WITH PROPOFOL N/A 04/26/2017   Procedure: COLONOSCOPY WITH PROPOFOL;  Surgeon: Rourk, Robert M, MD;  [facility];  Service: Endoscopy;  Laterality: N/A;  2:00PM - spoke with pt's wife, knows to arrive at 11:45  . ESOPHAGOGASTRODUODENOSCOPY  06/28/2011   Dr. Rourk-mild erosive reflux esophagitis, hiatal hernia-gastritis  . ESOPHAGOGASTRODUODENOSCOPY (EGD) WITH PROPOFOL N/A 04/26/2017   Procedure: ESOPHAGOGASTRODUODENOSCOPY (EGD) WITH PROPOFOL;  Surgeon: Rourk, Robert M, MD;  [facility];  Service: Endoscopy;  Laterality: N/A;  . EXTERNAL EAR SURGERY     right ear-cleaned out ear and created new eardrum  . INGUINAL HERNIA REPAIR  2-3 yrs ago   left-[facility]  .  INTRAMEDULLARY (IM) NAIL INTERTROCHANTERIC Right 09/02/2015   Procedure: RIGHT  INTERTROCHANTRIC HIP;  Surgeon: Timothy D Murphy, MD;  [facility];  Service: Orthopedics;  Laterality: Right;  With MAC  . KNEE SURGERY     left-arthroscopy-[facility]  . SKULL FRACTURE ELEVATION     fractured cheeck bone repaired  . TOTAL HIP ARTHROPLASTY  12/18/2011   Procedure: TOTAL HIP ARTHROPLASTY;  Surgeon: Stanley E Harrison, MD;  [facility];  Service: Orthopedics;  Laterality: Left;  . VIDEO BRONCHOSCOPY WITH ENDOBRONCHIAL ULTRASOUND N/A 05/04/2017   Procedure: VIDEO BRONCHOSCOPY WITH ENDOBRONCHIAL ULTRASOUND;  Surgeon: Hendrickson, Steven C, MD;  [facility];  Service: Thoracic;  Laterality: N/A;  . WISDOM TOOTH EXTRACTION      Family History  Problem Relation Age of Onset  . Stroke Mother 73  . Heart disease Father 92       MI  . Diabetes Maternal Uncle   . Colon cancer Neg Hx   . Cancer Neg Hx     Current Outpatient Medications on File Prior to Visit  Medication Sig Dispense Refill  . CARBOPLATIN IV Inject into the vein. Weekly during radiation    . cholecalciferol (VITAMIN D) 1000 units tablet Take 1,000 Units by mouth daily.    . lidocaine-prilocaine (EMLA) cream Apply a quarter size amount to affected area 1 hour prior to coming to chemotherapy.  Do not rub in.  Cover with plastic wrap. 30 g 2  .   Nutritional Supplements (ENSURE ENLIVE PO) Take 237 mLs by mouth daily at 2 PM.     . ondansetron (ZOFRAN) 8 MG tablet Take 1 tablet (8 mg total) by mouth every 8 (eight) hours as needed for nausea or vomiting. 30 tablet 2  . PACLitaxel (TAXOL IV) Inject into the vein. Weekly during radiation    . PROAIR HFA 108 (90 Base) MCG/ACT inhaler Inhale 2 puffs into the lungs every 6 (six) hours as needed (for shortness of breath/wheezing/cough).   2  . prochlorperazine (COMPAZINE) 10 MG tablet Take 1 tablet (10 mg total) by mouth every 6 (six) hours as needed for nausea or vomiting. 30 tablet 2  .  Propylene Glycol-Glycerin (SOOTHE) 0.6-0.6 % SOLN Place 1-2 drops into both eyes 3 (three) times daily as needed (for dry/irritated eyes.).    . Skin Protectants, Misc. (EUCERIN) cream Apply 1 application topically 4 (four) times daily as needed for dry skin (for itchy or dry skin).    . Tiotropium Bromide Monohydrate (SPIRIVA RESPIMAT) 2.5 MCG/ACT AERS Inhale 2 puffs into the lungs every evening.      No current facility-administered medications on file prior to visit.     No Known Allergies  Social History   Substance and Sexual Activity  Alcohol Use Yes   Comment: history of ETOH abuse, stopped 1.5 years ago ([place] one drink [place] [employer]).     Social History   Tobacco Use  Smoking Status Current Every Day Smoker  . Packs/day: 0.50  . Years: 62.00  . Pack years: 31.00  . Types: Cigarettes  Smokeless Tobacco Never Used  Tobacco Comment   1/2 ppd > 60 years    Review of Systems  Constitutional: Negative.   HENT: Negative.   Eyes: Negative.   Respiratory: Positive for shortness of breath.   Cardiovascular: Negative.   Gastrointestinal: Negative.   Genitourinary: Negative.   Musculoskeletal: Negative.   Skin: Negative.   Neurological: Negative.   Endo/Heme/Allergies: Negative.   Psychiatric/Behavioral: Negative.     Objective   Vitals:   05/31/17 1103  BP: (!) 176/86  Pulse: 87  Temp: 99.1 F (37.3 C)    Physical Exam  Constitutional: He is oriented to person, place, and time and well-developed, well-nourished, and in no distress.  HENT:  Head: Normocephalic and atraumatic.  Cardiovascular: Normal rate, regular rhythm and normal heart sounds. Exam reveals no gallop and no friction rub.  No murmur heard. Pulmonary/Chest: Effort normal and breath sounds normal. No respiratory distress. He has no wheezes. He has no rales.  Neurological: He is alert and oriented to person, place, and time.  Skin: Skin is warm and dry.  Vitals reviewed. Oncology notes  reviewed.  Assessment  Small cell carcinoma of right lung, need for central venous access  Plan   Patient is scheduled for Port-A-Cath insertion on 06/06/2017.  The risks and benefits of the procedure including bleeding, infection, and pneumothorax were fully explained to the patient, who gave informed consent. 

## 2017-05-31 NOTE — Progress Notes (Signed)
Chemotherapy teaching completed.  Consent signed.  Extensive teaching packet given.   

## 2017-05-31 NOTE — Progress Notes (Signed)
Ian Aguilar; [id number]; 01-05-1941   HPI Patient is a 77 year old white male who was referred to my care by Oncology/[place] for placement of a Port-A-Cath.  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  He is to start treatment next week.  Oncology needs central venous access.  Patient currently has no pain. Past Medical History:  Diagnosis Date  . Anxiety   . Arthritis   . Cirrhosis ([place])    confirmed by MRI on 07/04/11.  no immunizations  . COPD (chronic obstructive pulmonary disease) ([place])   . Depression with anxiety   . ETOH abuse   . Hyperlipidemia   . Nodule of upper lobe of right lung    with mediastinal adenopathy  . Wears dentures   . Wears glasses     Past Surgical History:  Procedure Laterality Date  . bilateral cataract surgery     [place]  . broken arm  6 yrs ago   left otif of wrist-[place]  . CLOSED REDUCTION WRIST FRACTURE Right 09/02/2015   Procedure: RIGHT WRIST REDUCTION;  Surgeon: Renette Butters, MD;  Location: [facility];  Service: Orthopedics;  Laterality: Right;  with MAC  . COLONOSCOPY  1996   [place]: external hemorrhoids, no polyps  . COLONOSCOPY  06/28/2011   Dr. Ala Bent diverticulosis,tubular adenoma, hyperplastic polyp  . COLONOSCOPY WITH PROPOFOL N/A 04/26/2017   Procedure: COLONOSCOPY WITH PROPOFOL;  Surgeon: Daneil Dolin, MD;  [facility];  Service: Endoscopy;  Laterality: N/A;  2:00PM - spoke with pt's wife, knows to arrive at 11:45  . ESOPHAGOGASTRODUODENOSCOPY  06/28/2011   Dr. Chelsea Aus erosive reflux esophagitis, hiatal hernia-gastritis  . ESOPHAGOGASTRODUODENOSCOPY (EGD) WITH PROPOFOL N/A 04/26/2017   Procedure: ESOPHAGOGASTRODUODENOSCOPY (EGD) WITH PROPOFOL;  Surgeon: Daneil Dolin, MD;  [facility];  Service: Endoscopy;  Laterality: N/A;  . EXTERNAL EAR SURGERY     right ear-cleaned out ear and created new eardrum  . INGUINAL HERNIA REPAIR  2-3 yrs ago   left-[place]  .  INTRAMEDULLARY (IM) NAIL INTERTROCHANTERIC Right 09/02/2015   Procedure: RIGHT  INTERTROCHANTRIC HIP;  Surgeon: Renette Butters, MD;  Location: [facility];  Service: Orthopedics;  Laterality: Right;  With MAC  . KNEE SURGERY     left-arthroscopy-[place]  . SKULL FRACTURE ELEVATION     fractured cheeck bone repaired  . TOTAL HIP ARTHROPLASTY  12/18/2011   Procedure: TOTAL HIP ARTHROPLASTY;  Surgeon: Carole Civil, MD;  Location: [facility];  Service: Orthopedics;  Laterality: Left;  Marland Kitchen VIDEO BRONCHOSCOPY WITH ENDOBRONCHIAL ULTRASOUND N/A 05/04/2017   Procedure: VIDEO BRONCHOSCOPY WITH ENDOBRONCHIAL ULTRASOUND;  Surgeon: Melrose Nakayama, MD;  Location: [facility] OR;  Service: Thoracic;  Laterality: N/A;  . WISDOM TOOTH EXTRACTION      Family History  Problem Relation Age of Onset  . Stroke Mother 67  . Heart disease Father 26       MI  . Diabetes Maternal Uncle   . Colon cancer Neg Hx   . Cancer Neg Hx     Current Outpatient Medications on File Prior to Visit  Medication Sig Dispense Refill  . CARBOPLATIN IV Inject into the vein. Weekly during radiation    . cholecalciferol (VITAMIN D) 1000 units tablet Take 1,000 Units by mouth daily.    Marland Kitchen lidocaine-prilocaine (EMLA) cream Apply a quarter size amount to affected area 1 hour prior to coming to chemotherapy.  Do not rub in.  Cover with plastic wrap. 30 g 2  .  Nutritional Supplements (ENSURE ENLIVE PO) Take 237 mLs by mouth daily at 2 PM.     . ondansetron (ZOFRAN) 8 MG tablet Take 1 tablet (8 mg total) by mouth every 8 (eight) hours as needed for nausea or vomiting. 30 tablet 2  . PACLitaxel (TAXOL IV) Inject into the vein. Weekly during radiation    . PROAIR HFA 108 (90 Base) MCG/ACT inhaler Inhale 2 puffs into the lungs every 6 (six) hours as needed (for shortness of breath/wheezing/cough).   2  . prochlorperazine (COMPAZINE) 10 MG tablet Take 1 tablet (10 mg total) by mouth every 6 (six) hours as needed for nausea or vomiting. 30 tablet 2  .  Propylene Glycol-Glycerin (SOOTHE) 0.6-0.6 % SOLN Place 1-2 drops into both eyes 3 (three) times daily as needed (for dry/irritated eyes.).    Marland Kitchen Skin Protectants, Misc. (EUCERIN) cream Apply 1 application topically 4 (four) times daily as needed for dry skin (for itchy or dry skin).    . Tiotropium Bromide Monohydrate (SPIRIVA RESPIMAT) 2.5 MCG/ACT AERS Inhale 2 puffs into the lungs every evening.      No current facility-administered medications on file prior to visit.     No Known Allergies  Social History   Substance and Sexual Activity  Alcohol Use Yes   Comment: history of ETOH abuse, stopped 1.5 years ago ([place] one drink [place] [employer]).     Social History   Tobacco Use  Smoking Status Current Every Day Smoker  . Packs/day: 0.50  . Years: 62.00  . Pack years: 31.00  . Types: Cigarettes  Smokeless Tobacco Never Used  Tobacco Comment   1/2 ppd > 60 years    Review of Systems  Constitutional: Negative.   HENT: Negative.   Eyes: Negative.   Respiratory: Positive for shortness of breath.   Cardiovascular: Negative.   Gastrointestinal: Negative.   Genitourinary: Negative.   Musculoskeletal: Negative.   Skin: Negative.   Neurological: Negative.   Endo/Heme/Allergies: Negative.   Psychiatric/Behavioral: Negative.     Objective   Vitals:   05/31/17 1103  BP: (!) 176/86  Pulse: 87  Temp: 99.1 F (37.3 C)    Physical Exam  Constitutional: He is oriented to person, place, and time and well-developed, well-nourished, and in no distress.  HENT:  Head: Normocephalic and atraumatic.  Cardiovascular: Normal rate, regular rhythm and normal heart sounds. Exam reveals no gallop and no friction rub.  No murmur heard. Pulmonary/Chest: Effort normal and breath sounds normal. No respiratory distress. He has no wheezes. He has no rales.  Neurological: He is alert and oriented to person, place, and time.  Skin: Skin is warm and dry.  Vitals reviewed. Oncology notes  reviewed.  Assessment  Small cell carcinoma of right lung, need for central venous access  Plan   Patient is scheduled for Port-A-Cath insertion on 06/06/2017.  The risks and benefits of the procedure including bleeding, infection, and pneumothorax were fully explained to the patient, who gave informed consent.

## 2017-06-01 ENCOUNTER — Encounter (HOSPITAL_COMMUNITY): Payer: Self-pay | Admitting: Internal Medicine

## 2017-06-04 ENCOUNTER — Encounter (HOSPITAL_COMMUNITY): Payer: Self-pay

## 2017-06-04 ENCOUNTER — Encounter (HOSPITAL_COMMUNITY)
Admission: RE | Admit: 2017-06-04 | Discharge: 2017-06-04 | Disposition: A | Discharge status: Home / Self Care | Payer: PPO | Source: Ambulatory Visit | Attending: General Surgery | Admitting: General Surgery

## 2017-06-05 ENCOUNTER — Other Ambulatory Visit (HOSPITAL_COMMUNITY): Payer: Self-pay | Admitting: Pharmacist

## 2017-06-05 ENCOUNTER — Inpatient Hospital Stay (HOSPITAL_COMMUNITY): Payer: PPO | Admitting: Dietician

## 2017-06-05 DIAGNOSIS — C3490 Malignant neoplasm of unspecified part of unspecified bronchus or lung: Secondary | ICD-10-CM | POA: Insufficient documentation

## 2017-06-05 NOTE — Progress Notes (Signed)
Nutrition Assessment  Reason for Assessment: Consult- New lung cancer patient  ASSESSMENT: 77 y/o male PMHx COPD, tobacco abuse, alcohol abuse (stopped 2 years ago), cirrhosis, htn. Anxiety/Depression. Underwent low-dose CT to screen for lung cancer 1/8 that showed suspicious lymph node and nodule. PET scan 1/31 confirms hypermetabolic behavior s/p biopsy showing NSCLC. Staging is III. Pt seen prior to chemotherapy, which is to start 4/11  Pt's cancer was found on screening, not due to symptomatic workup. Denies any n/v/c/d or any changes in appetite or weight. He says he has a good appetite and was "hoping to lose some weight", because he has gained some over the years. He gives recent UBW of 180 lbs.   Per review of charted weight history, pt has not had any weight loss. He has maintained weight at 178-180 for the last 2 months. The next most recent weight history is from 2017, when patient was weighing in upper 140s, lower 150s, indicating weight gain over past few years.  Went through current diet recall.  Breakfast: 1 Honeybun, 1 waffle.  Lunch: Entrust (off-brand of ensure, [employer] Version) as meal replacement Dinner: Balanced meal/plate method. Has vegetable, starch, meat HS snack: Butter Pecan Ice cream Beverages: cup of coffee w/ breakfast, maybe a single diet soda,  Reports minimal fluid intake during day  NUTRITION - FOCUSED PHYSICAL EXAM: WDL.   MEDICATIONS:  Chemotherapy: Taxol and carboplatin  Vit d3 + Supportive medications: zofran, compazine  LABS: None x2 weeks  Wt Readings from Last 10 Encounters:  05/31/17 177 lb (80.3 kg)  05/22/17 180 lb (81.6 kg)  05/11/17 179 lb 3.2 oz (81.3 kg)  05/04/17 175 lb (79.4 kg)  04/23/17 176 lb (79.8 kg)  04/18/17 178 lb (80.7 kg)  04/09/17 178 lb 6.4 oz (80.9 kg)  04/06/17 178 lb 3.2 oz (80.8 kg)  10/11/15 146 lb (66.2 kg)  09/01/15 153 lb (69.4 kg)   ANTHROPOMETRICS: Most recent anthros  from 4/4 Height: 5\' 9"  (175.26  cm) Weight: 177 lbs (80.3 kg) BMI: 26.13 (overweight)  UBW:  IBW:  160 lbs (72.73 kg)  ESTIMATED ENERGY NEEDS: (during tx) Kcal: 2150-2400 (27-30 kcal/kg bw) Protein: 96-112g Pro (1.2-1.4 g/kg bw) Fluid: 2.1-2.4 L fluid ( 39ml/kcal)     NUTRITION DIAGNOSIS:  Increased Protein/kcal needs related to cancer and cancer related treatments as evidenced by the nutritional recommendations for this condition and therapies.   DOCUMENTATION CODES:  Not applicable  INTERVENTION:  Discussed with pt the basics of nutrition therapy during treatment: minimize side effects w/ diet, prioritize eating high kcal/protein foods as able and eating frequently. All of this is done in attempt to maintain weight and, by association, improve outcomes.    We reviewed his diet recall. His breakfast is very low quality. We reviewed more appropriate breakfast options, specifically eggs, cottage cheese, greek yogurt, PB and fruit etc  Because pt's appetite is poorer, patient needs to prioritize calorie/protein dense foods. RD reccomended "Big Three" of cheese, Peanut butter and eggs which are some of the most common foods that are high in both kcals and protein.   RD explained concept of never eating a food by itself. He should add toppings, condiments, dressings, creams, syrups etc to meals. Examples given are adding whipped cream/choc syrup/fruit to pancakes, adding cheese/crackers to soup and adding heaving dressings/nuts/ cheese/ to salads. These items are very calorie dense and take of very up very little space in the stomach.   RD reviewed kcal, pro and fluid goals (listed above).  He is drinking minimal fluids. We discussed optimal beverage choices such as milk/buttermilk or low sugar sports drinks.   We briefly reviewed diet tips when dealing with nausea.   Reviewed Ensure Assistance Program. He is requesting his first case of Ensure today. He is eligible for two remaining cases.   RD gave/reviewed handout  titled "Increasing calories and Protein".   GOAL:  Oral intake to meet >90% needs, wt stability  MONITOR:  Oral intake, weight, Chemotherapy tolerance  Next Visit: As needed.   Burtis Junes RD, LDN, CNSC Clinical Nutrition Available Tues-Sat via Pager: [phone] 06/05/2017 10:53 AM

## 2017-06-06 ENCOUNTER — Ambulatory Visit (HOSPITAL_COMMUNITY)
Admission: RE | Admit: 2017-06-06 | Discharge: 2017-06-06 | Disposition: A | Discharge status: Home Health | Payer: PPO | Source: Ambulatory Visit | Attending: General Surgery | Admitting: General Surgery

## 2017-06-06 ENCOUNTER — Encounter (HOSPITAL_COMMUNITY)
Admission: RE | Disposition: A | Discharge status: Home Health | Payer: Self-pay | Source: Ambulatory Visit | Attending: General Surgery

## 2017-06-06 ENCOUNTER — Encounter (HOSPITAL_COMMUNITY): Payer: Self-pay | Admitting: *Deleted

## 2017-06-06 ENCOUNTER — Telehealth (HOSPITAL_COMMUNITY): Payer: Self-pay | Admitting: Emergency Medicine

## 2017-06-06 DIAGNOSIS — C3491 Malignant neoplasm of unspecified part of right bronchus or lung: Secondary | ICD-10-CM | POA: Insufficient documentation

## 2017-06-06 DIAGNOSIS — Z96642 Presence of left artificial hip joint: Secondary | ICD-10-CM | POA: Insufficient documentation

## 2017-06-06 DIAGNOSIS — F17210 Nicotine dependence, cigarettes, uncomplicated: Secondary | ICD-10-CM | POA: Insufficient documentation

## 2017-06-06 DIAGNOSIS — Z5309 Procedure and treatment not carried out because of other contraindication: Secondary | ICD-10-CM | POA: Insufficient documentation

## 2017-06-06 DIAGNOSIS — J449 Chronic obstructive pulmonary disease, unspecified: Secondary | ICD-10-CM | POA: Diagnosis not present

## 2017-06-06 DIAGNOSIS — C3411 Malignant neoplasm of upper lobe, right bronchus or lung: Secondary | ICD-10-CM | POA: Diagnosis not present

## 2017-06-06 SURGERY — INSERTION, TUNNELED CENTRAL VENOUS DEVICE, WITH PORT
Anesthesia: Monitor Anesthesia Care | Laterality: Left

## 2017-06-06 NOTE — Progress Notes (Signed)
PT. Arrived for surgery.  Pt. Stated that he ate a honey bun & had an 8oz cup of coffee with half & half @ 0515.  Spoke with Dr. Currie Paris & Dr. Arnoldo Morale & made them aware of situation. Pt. Procedure cancelled & to be moved to another day. Pt. understanding of circumstances & instructed on NPO status prior to procedure.

## 2017-06-06 NOTE — Telephone Encounter (Signed)
Called pt to let him know that we moved his chemo start date to Monday 4/15 at 9:15.  Reminded pt not to eat or drink on Friday before he had his port placed.  He verbalized understanding.

## 2017-06-07 ENCOUNTER — Ambulatory Visit (HOSPITAL_COMMUNITY): Payer: PPO

## 2017-06-08 ENCOUNTER — Ambulatory Visit (HOSPITAL_COMMUNITY): Payer: PPO

## 2017-06-08 ENCOUNTER — Ambulatory Visit (HOSPITAL_COMMUNITY): Payer: PPO | Admitting: Anesthesiology

## 2017-06-08 ENCOUNTER — Encounter (HOSPITAL_COMMUNITY): Payer: Self-pay | Admitting: Anesthesiology

## 2017-06-08 ENCOUNTER — Encounter (HOSPITAL_COMMUNITY)
Admission: RE | Disposition: A | Discharge status: Home / Self Care | Payer: Self-pay | Source: Ambulatory Visit | Attending: General Surgery

## 2017-06-08 ENCOUNTER — Ambulatory Visit (HOSPITAL_COMMUNITY)
Admission: RE | Admit: 2017-06-08 | Discharge: 2017-06-08 | Disposition: A | Discharge status: Home / Self Care | Payer: PPO | Source: Ambulatory Visit | Attending: General Surgery | Admitting: General Surgery

## 2017-06-08 DIAGNOSIS — Z96642 Presence of left artificial hip joint: Secondary | ICD-10-CM | POA: Insufficient documentation

## 2017-06-08 DIAGNOSIS — J449 Chronic obstructive pulmonary disease, unspecified: Secondary | ICD-10-CM | POA: Insufficient documentation

## 2017-06-08 DIAGNOSIS — C3491 Malignant neoplasm of unspecified part of right bronchus or lung: Secondary | ICD-10-CM | POA: Insufficient documentation

## 2017-06-08 DIAGNOSIS — Z452 Encounter for adjustment and management of vascular access device: Secondary | ICD-10-CM | POA: Diagnosis not present

## 2017-06-08 DIAGNOSIS — C3490 Malignant neoplasm of unspecified part of unspecified bronchus or lung: Secondary | ICD-10-CM

## 2017-06-08 DIAGNOSIS — D649 Anemia, unspecified: Secondary | ICD-10-CM | POA: Diagnosis not present

## 2017-06-08 DIAGNOSIS — F419 Anxiety disorder, unspecified: Secondary | ICD-10-CM | POA: Diagnosis not present

## 2017-06-08 DIAGNOSIS — F17210 Nicotine dependence, cigarettes, uncomplicated: Secondary | ICD-10-CM | POA: Diagnosis not present

## 2017-06-08 DIAGNOSIS — I10 Essential (primary) hypertension: Secondary | ICD-10-CM | POA: Diagnosis not present

## 2017-06-08 DIAGNOSIS — Z95828 Presence of other vascular implants and grafts: Secondary | ICD-10-CM

## 2017-06-08 HISTORY — PX: PORTACATH PLACEMENT: SHX2246

## 2017-06-08 SURGERY — INSERTION, TUNNELED CENTRAL VENOUS DEVICE, WITH PORT
Anesthesia: Monitor Anesthesia Care | Site: Chest | Laterality: Left

## 2017-06-08 MED ORDER — PROPOFOL 10 MG/ML IV BOLUS
INTRAVENOUS | Status: DC | PRN
  Administered 2017-06-08 (×4): 8 mg via INTRAVENOUS

## 2017-06-08 MED ORDER — CHLORHEXIDINE GLUCONATE CLOTH 2 % EX PADS: 6.0000 | MEDICATED_PAD | Freq: Once | CUTANEOUS | Status: DC

## 2017-06-08 MED ORDER — CEFAZOLIN SODIUM-DEXTROSE 2-4 GM/100ML-% IV SOLN
2.0000 g | INTRAVENOUS | Status: AC
  Administered 2017-06-08: 2 g via INTRAVENOUS
  Filled 2017-06-08: qty 100

## 2017-06-08 MED ORDER — PROPOFOL 10 MG/ML IV BOLUS
INTRAVENOUS | Status: AC
  Filled 2017-06-08: qty 40

## 2017-06-08 MED ORDER — KETOROLAC TROMETHAMINE 30 MG/ML IJ SOLN
30.0000 mg | Freq: Once | INTRAMUSCULAR | Status: AC
Start: 2017-06-08 — End: 2017-06-08
  Administered 2017-06-08: 30 mg via INTRAVENOUS
  Filled 2017-06-08: qty 1

## 2017-06-08 MED ORDER — EPHEDRINE SULFATE 50 MG/ML IJ SOLN
INTRAMUSCULAR | Status: AC
  Filled 2017-06-08: qty 1

## 2017-06-08 MED ORDER — LIDOCAINE HCL (PF) 1 % IJ SOLN
INTRAMUSCULAR | Status: AC
  Filled 2017-06-08: qty 30

## 2017-06-08 MED ORDER — FENTANYL CITRATE (PF) 100 MCG/2ML IJ SOLN
INTRAMUSCULAR | Status: AC
  Filled 2017-06-08: qty 2

## 2017-06-08 MED ORDER — MIDAZOLAM HCL 2 MG/2ML IJ SOLN
INTRAMUSCULAR | Status: DC | PRN
  Administered 2017-06-08: 1 mg via INTRAVENOUS

## 2017-06-08 MED ORDER — PROPOFOL 500 MG/50ML IV EMUL
INTRAVENOUS | Status: DC | PRN
  Administered 2017-06-08: 25 ug/kg/min via INTRAVENOUS

## 2017-06-08 MED ORDER — FENTANYL CITRATE (PF) 100 MCG/2ML IJ SOLN
INTRAMUSCULAR | Status: DC | PRN
  Administered 2017-06-08: 25 ug via INTRAVENOUS

## 2017-06-08 MED ORDER — SODIUM CHLORIDE 0.9 % IJ SOLN
INTRAMUSCULAR | Status: AC
  Filled 2017-06-08: qty 10

## 2017-06-08 MED ORDER — LACTATED RINGERS IV SOLN
INTRAVENOUS | Status: DC | PRN
  Administered 2017-06-08: 07:00:00 via INTRAVENOUS

## 2017-06-08 MED ORDER — SODIUM CHLORIDE 0.9 % IV SOLN
INTRAVENOUS | Status: AC | PRN
  Administered 2017-06-08: 500 mL via INTRAMUSCULAR

## 2017-06-08 MED ORDER — LIDOCAINE HCL (PF) 1 % IJ SOLN
INTRAMUSCULAR | Status: DC | PRN
  Administered 2017-06-08: 10 mL

## 2017-06-08 MED ORDER — MIDAZOLAM HCL 2 MG/2ML IJ SOLN
INTRAMUSCULAR | Status: AC
  Filled 2017-06-08: qty 2

## 2017-06-08 MED ORDER — HEPARIN SOD (PORK) LOCK FLUSH 100 UNIT/ML IV SOLN
INTRAVENOUS | Status: DC | PRN
  Administered 2017-06-08: 500 [IU]

## 2017-06-08 MED ORDER — HEPARIN SOD (PORK) LOCK FLUSH 100 UNIT/ML IV SOLN
INTRAVENOUS | Status: AC
  Filled 2017-06-08: qty 5

## 2017-06-08 MED ORDER — LACTATED RINGERS IV SOLN: INTRAVENOUS | Status: DC

## 2017-06-08 MED ORDER — HYDROCODONE-ACETAMINOPHEN 5-325 MG PO TABS: 1.0000 | ORAL_TABLET | Freq: Four times a day (QID) | ORAL | 0 refills | Status: DC | PRN

## 2017-06-08 MED ORDER — LIDOCAINE HCL (PF) 1 % IJ SOLN
INTRAMUSCULAR | Status: AC
  Filled 2017-06-08: qty 5

## 2017-06-08 SURGICAL SUPPLY — 30 items
BAG DECANTER FOR FLEXI CONT (MISCELLANEOUS) ×2 IMPLANT
BAG HAMPER (MISCELLANEOUS) ×2 IMPLANT
CHLORAPREP W/TINT 10.5 ML (MISCELLANEOUS) ×2 IMPLANT
CLOTH BEACON ORANGE TIMEOUT ST (SAFETY) ×2 IMPLANT
COVER LIGHT HANDLE STERIS (MISCELLANEOUS) ×4 IMPLANT
DECANTER SPIKE VIAL GLASS SM (MISCELLANEOUS) ×2 IMPLANT
DERMABOND ADVANCED (GAUZE/BANDAGES/DRESSINGS) ×1
DERMABOND ADVANCED .7 DNX12 (GAUZE/BANDAGES/DRESSINGS) ×1 IMPLANT
DRAPE C-ARM FOLDED MOBILE STRL (DRAPES) ×2 IMPLANT
ELECT REM PT RETURN 9FT ADLT (ELECTROSURGICAL) ×2
ELECTRODE REM PT RTRN 9FT ADLT (ELECTROSURGICAL) ×1 IMPLANT
GLOVE BIOGEL PI IND STRL 7.0 (GLOVE) ×1 IMPLANT
GLOVE BIOGEL PI INDICATOR 7.0 (GLOVE) ×1
GLOVE ECLIPSE 6.5 STRL STRAW (GLOVE) ×2 IMPLANT
GLOVE SURG SS PI 7.5 STRL IVOR (GLOVE) ×2 IMPLANT
GOWN STRL REUS W/TWL LRG LVL3 (GOWN DISPOSABLE) ×4 IMPLANT
IV NS 500ML (IV SOLUTION) ×1
IV NS 500ML BAXH (IV SOLUTION) ×1 IMPLANT
KIT PORT POWER 8FR ISP MRI (Port) ×2 IMPLANT
KIT TURNOVER KIT A (KITS) ×2 IMPLANT
MANIFOLD NEPTUNE II (INSTRUMENTS) ×2 IMPLANT
NEEDLE HYPO 25X1 1.5 SAFETY (NEEDLE) ×2 IMPLANT
PACK MINOR (CUSTOM PROCEDURE TRAY) ×2 IMPLANT
PAD ARMBOARD 7.5X6 YLW CONV (MISCELLANEOUS) ×2 IMPLANT
SET BASIN LINEN APH (SET/KITS/TRAYS/PACK) ×2 IMPLANT
SUT MNCRL AB 4-0 PS2 18 (SUTURE) ×2 IMPLANT
SUT VIC AB 3-0 SH 27 (SUTURE) ×1
SUT VIC AB 3-0 SH 27X BRD (SUTURE) ×1 IMPLANT
SYR 20CC LL (SYRINGE) ×2 IMPLANT
SYR CONTROL 10ML LL (SYRINGE) ×2 IMPLANT

## 2017-06-08 NOTE — Anesthesia Preprocedure Evaluation (Signed)
Anesthesia Evaluation  Patient identified by MRN, date of birth, ID band Patient awake    Reviewed: Allergy & Precautions, H&P , NPO status , Patient's Chart, lab work & pertinent test results  Airway Mallampati: II  TM Distance: >3 FB Neck ROM: full    Dental no notable dental hx. (+) Chipped, Missing, Poor Dentition   Pulmonary neg pulmonary ROS, COPD,  COPD inhaler, Current Smoker,    Pulmonary exam normal breath sounds clear to auscultation + decreased breath sounds+ wheezing      Cardiovascular Exercise Tolerance: Good hypertension, On Medications negative cardio ROS   Rhythm:regular Rate:Normal     Neuro/Psych PSYCHIATRIC DISORDERS Anxiety negative neurological ROS  negative psych ROS   GI/Hepatic negative GI ROS, Neg liver ROS,   Endo/Other  negative endocrine ROS  Renal/GU negative Renal ROS  negative genitourinary   Musculoskeletal   Abdominal   Peds  Hematology negative hematology ROS (+) anemia ,   Anesthesia Other Findings H/o extensive tobacco abuse, COPD with dx of Lung Cancer.  Forgot to take inhaler this am.  On O2 in holding.    Reproductive/Obstetrics negative OB ROS                             Anesthesia Physical Anesthesia Plan  ASA: IV  Anesthesia Plan: MAC   Post-op Pain Management:    Induction:   PONV Risk Score and Plan:   Airway Management Planned:   Additional Equipment:   Intra-op Plan:   Post-operative Plan:   Informed Consent: I have reviewed the patients History and Physical, chart, labs and discussed the procedure including the risks, benefits and alternatives for the proposed anesthesia with the patient or authorized representative who has indicated his/her understanding and acceptance.   Dental Advisory Given  Plan Discussed with: CRNA  Anesthesia Plan Comments:         Anesthesia Quick Evaluation

## 2017-06-08 NOTE — Transfer of Care (Signed)
Immediate Anesthesia Transfer of Care Note  Patient: Ian Aguilar  Procedure(s) Performed: INSERTION POWER PORT WITH  ATTACHED CATHETER LEFT SUBCLAVIAN (Left Chest)  Patient Location: PACU  Anesthesia Type:MAC  Level of Consciousness: awake, alert  and oriented  Airway & Oxygen Therapy: Patient Spontanous Breathing and Patient connected to nasal cannula oxygen  Post-op Assessment: Report given to RN  Post vital signs: Reviewed and stable  Last Vitals:  Vitals Value Taken Time  BP 156/67 06/08/2017  8:08 AM  Temp    Pulse 57 06/08/2017  8:09 AM  Resp 19 06/08/2017  8:09 AM  SpO2 99 % 06/08/2017  8:09 AM  Vitals shown include unvalidated device data.  Last Pain:  Vitals:   06/08/17 0648  TempSrc: Oral  PainSc:       Patients Stated Pain Goal: 8 (57/01/77 9390)  Complications: No apparent anesthesia complications

## 2017-06-08 NOTE — Anesthesia Postprocedure Evaluation (Signed)
Anesthesia Post Note  Patient: Ian Aguilar  Procedure(s) Performed: INSERTION POWER PORT WITH  ATTACHED CATHETER LEFT SUBCLAVIAN (Left Chest)  Patient location during evaluation: Short Stay Anesthesia Type: MAC Level of consciousness: awake and alert Pain management: satisfactory to patient Vital Signs Assessment: post-procedure vital signs reviewed and stable Respiratory status: spontaneous breathing Cardiovascular status: stable Postop Assessment: no apparent nausea or vomiting Anesthetic complications: no     Last Vitals:  Vitals:   06/08/17 0845 06/08/17 0909  BP: (!) 167/75 (!) 171/60  Pulse: (!) 51 (!) 55  Resp: 19 16  Temp:  36.7 C  SpO2: 100% 91%    Last Pain:  Vitals:   06/08/17 0909  TempSrc: Oral  PainSc: 0-No pain                 Tmya Wigington

## 2017-06-08 NOTE — Interval H&P Note (Signed)
History and Physical Interval Note:  06/08/2017 6:50 AM  Ian Aguilar  has presented today for surgery, with the diagnosis of right lung cancer  The various methods of treatment have been discussed with the patient and family. After consideration of risks, benefits and other options for treatment, the patient has consented to  Procedure(s): INSERTION PORT-A-CATH (Left) as a surgical intervention .  The patient's history has been reviewed, patient examined, no change in status, stable for surgery.  I have reviewed the patient's chart and labs.  Questions were answered to the patient's satisfaction.     Aviva Signs

## 2017-06-08 NOTE — Discharge Instructions (Signed)
Implanted Port Home Guide °An implanted port is a type of central line that is placed under the skin. Central lines are used to provide IV access when treatment or nutrition needs to be given through a person’s veins. Implanted ports are used for long-term IV access. An implanted port may be placed because: °· You need IV medicine that would be irritating to the small veins in your hands or arms. °· You need long-term IV medicines, such as antibiotics. °· You need IV nutrition for a long period. °· You need frequent blood draws for lab tests. °· You need dialysis. ° °Implanted ports are usually placed in the chest area, but they can also be placed in the upper arm, the abdomen, or the leg. An implanted port has two main parts: °· Reservoir. The reservoir is round and will appear as a small, raised area under your skin. The reservoir is the part where a needle is inserted to give medicines or draw blood. °· Catheter. The catheter is a thin, flexible tube that extends from the reservoir. The catheter is placed into a large vein. Medicine that is inserted into the reservoir goes into the catheter and then into the vein. ° °How will I care for my incision site? °Do not get the incision site wet. Bathe or shower as directed by your health care provider. °How is my port accessed? °Special steps must be taken to access the port: °· Before the port is accessed, a numbing cream can be placed on the skin. This helps numb the skin over the port site. °· Your health care provider uses a sterile technique to access the port. °? Your health care provider must put on a mask and sterile gloves. °? The skin over your port is cleaned carefully with an antiseptic and allowed to dry. °? The port is gently pinched between sterile gloves, and a needle is inserted into the port. °· Only "non-coring" port needles should be used to access the port. Once the port is accessed, a blood return should be checked. This helps ensure that the port  is in the vein and is not clogged. °· If your port needs to remain accessed for a constant infusion, a clear (transparent) bandage will be placed over the needle site. The bandage and needle will need to be changed every week, or as directed by your health care provider. °· Keep the bandage covering the needle clean and dry. Do not get it wet. Follow your health care provider’s instructions on how to take a shower or bath while the port is accessed. °· If your port does not need to stay accessed, no bandage is needed over the port. ° °What is flushing? °Flushing helps keep the port from getting clogged. Follow your health care provider’s instructions on how and when to flush the port. Ports are usually flushed with saline solution or a medicine called heparin. The need for flushing will depend on how the port is used. °· If the port is used for intermittent medicines or blood draws, the port will need to be flushed: °? After medicines have been given. °? After blood has been drawn. °? As part of routine maintenance. °· If a constant infusion is running, the port may not need to be flushed. ° °How long will my port stay implanted? °The port can stay in for as long as your health care provider thinks it is needed. When it is time for the port to come out, surgery will be   done to remove it. The procedure is similar to the one performed when the port was put in. °When should I seek immediate medical care? °When you have an implanted port, you should seek immediate medical care if: °· You notice a bad smell coming from the incision site. °· You have swelling, redness, or drainage at the incision site. °· You have more swelling or pain at the port site or the surrounding area. °· You have a fever that is not controlled with medicine. ° °This information is not intended to replace advice given to you by your health care provider. Make sure you discuss any questions you have with your health care provider. °Document  Released: 02/13/2005 Document Revised: 07/22/2015 Document Reviewed: 10/21/2012 °Elsevier Interactive Patient Education © 2017 [employer] °Implanted Port Insertion, Care After °This sheet gives you information about how to care for yourself after your procedure. Your health care provider may also give you more specific instructions. If you have problems or questions, contact your health care provider. °What can I expect after the procedure? °After your procedure, it is common to have: °· Discomfort at the port insertion site. °· Bruising on the skin over the port. This should improve over 3-4 days. ° °Follow these instructions at home: °Port care °· After your port is placed, you will get a manufacturer's information card. The card has information about your port. Keep this card with you at all times. °· Take care of the port as told by your health care provider. Ask your health care provider if you or a family member can get training for taking care of the port at home. A home health care nurse may also take care of the port. °· Make sure to remember what type of port you have. °Incision care °· Follow instructions from your health care provider about how to take care of your port insertion site. Make sure you: °? Wash your hands with soap and water before you change your bandage (dressing). If soap and water are not available, use hand sanitizer. °? Change your dressing as told by your health care provider. °? Leave stitches (sutures), skin glue, or adhesive strips in place. These skin closures may need to stay in place for 2 weeks or longer. If adhesive strip edges start to loosen and curl up, you may trim the loose edges. Do not remove adhesive strips completely unless your health care provider tells you to do that. °· Check your port insertion site every day for signs of infection. Check for: °? More redness, swelling, or pain. °? More fluid or blood. °? Warmth. °? Pus or a bad smell. °General  instructions °· Do not take baths, swim, or use a hot tub until your health care provider approves. °· Do not lift anything that is heavier than 10 lb (4.5 kg) for a week, or as told by your health care provider. °· Ask your health care provider when it is okay to: °? Return to work or school. °? Resume usual physical activities or sports. °· Do not drive for 24 hours if you were given a medicine to help you relax (sedative). °· Take over-the-counter and prescription medicines only as told by your health care provider. °· Wear a medical alert bracelet in case of an emergency. This will tell any health care providers that you have a port. °· Keep all follow-up visits as told by your health care provider. This is important. °Contact a health care provider if: °· You cannot   flush your port with saline as directed, or you cannot draw blood from the port. °· You have a fever or chills. °· You have more redness, swelling, or pain around your port insertion site. °· You have more fluid or blood coming from your port insertion site. °· Your port insertion site feels warm to the touch. °· You have pus or a bad smell coming from the port insertion site. °Get help right away if: °· You have chest pain or shortness of breath. °· You have bleeding from your port that you cannot control. °Summary °· Take care of the port as told by your health care provider. °· Change your dressing as told by your health care provider. °· Keep all follow-up visits as told by your health care provider. °This information is not intended to replace advice given to you by your health care provider. Make sure you discuss any questions you have with your health care provider. °Document Released: 12/04/2012 Document Revised: 01/05/2016 Document Reviewed: 01/05/2016 °Elsevier Interactive Patient Education © 2017 [employer] ° °

## 2017-06-08 NOTE — Op Note (Signed)
Patient:  VIDAL LAMPKINS  DOB:  05-Jun-1940  MRN:  [id number]   Preop Diagnosis: Lung carcinoma, need for central venous access  Postop Diagnosis: Same  Procedure: Port-A-Cath insertion  Surgeon: Aviva Signs, MD  Anes: MAC  Indications: Patient is a 77 year old white male who presents for Port-A-Cath insertion.  He is about to undergo chemotherapy for lung cancer.  The risks and benefits of the procedure including bleeding, infection, and pneumothorax were fully explained to the patient, who gave informed consent.  Procedure note: The patient was placed in the Trendelenburg position after the left upper chest was prepped and draped using the usual sterile technique with DuraPrep.  Surgical site confirmation was performed.  1% Xylocaine was used for local anesthesia.  An incision was made below the left clavicle.  Subcutaneous pocket was formed.  The needle was advanced into the left subclavian vein using the Seldinger technique without difficulty.  A guidewire was then advanced into the right atrium under fluoroscopic guidance.  An introducer and peel-away sheath were placed over the guidewire.  The catheter was then inserted through the peel-away sheath and the peel-away sheath was removed.  The catheter was then attached to the port and the port placed in subcutaneous pocket.  Adequate positioning was confirmed by fluoroscopy.  Good backflow of blood was noted on aspiration of the port.  The port was flushed with heparin flush.  The subcutaneous layer was reapproximated using a 3-0 Vicryl interrupted suture.  The skin was closed using a 4-0 Monocryl subcuticular suture.  Dermabond was applied.  All tape and needle counts were correct at the end of the procedure.  Patient was transferred to PACU in stable condition.  A chest x-ray will be performed at that time.    Complications: None  EBL: Minimal  Specimen: None

## 2017-06-11 ENCOUNTER — Inpatient Hospital Stay (HOSPITAL_COMMUNITY): Payer: PPO

## 2017-06-11 ENCOUNTER — Encounter (HOSPITAL_COMMUNITY): Payer: Self-pay

## 2017-06-11 VITALS — BP 152/81 | HR 69 | Temp 97.9°F | Resp 20 | Wt 177.9 lb

## 2017-06-11 DIAGNOSIS — C3491 Malignant neoplasm of unspecified part of right bronchus or lung: Secondary | ICD-10-CM

## 2017-06-11 DIAGNOSIS — Z5111 Encounter for antineoplastic chemotherapy: Secondary | ICD-10-CM | POA: Diagnosis not present

## 2017-06-11 DIAGNOSIS — C3490 Malignant neoplasm of unspecified part of unspecified bronchus or lung: Secondary | ICD-10-CM

## 2017-06-11 DIAGNOSIS — C3411 Malignant neoplasm of upper lobe, right bronchus or lung: Secondary | ICD-10-CM | POA: Diagnosis not present

## 2017-06-11 LAB — COMPREHENSIVE METABOLIC PANEL
ALT: 14 U/L — ABNORMAL LOW (ref 17–63)
AST: 27 U/L (ref 15–41)
Albumin: 3.9 g/dL (ref 3.5–5.0)
Alkaline Phosphatase: 65 U/L (ref 38–126)
Anion gap: 11 (ref 5–15)
BUN: 16 mg/dL (ref 6–20)
CO2: 27 mmol/L (ref 22–32)
Calcium: 9.1 mg/dL (ref 8.9–10.3)
Chloride: 101 mmol/L (ref 101–111)
Creatinine, Ser: 0.82 mg/dL (ref 0.61–1.24)
GFR calc Af Amer: >60 mL/min (ref >60)
GFR calc non Af Amer: >60 mL/min (ref >60)
Glucose, Bld: 88 mg/dL (ref 65–99)
Potassium: 4.1 mmol/L (ref 3.5–5.1)
Sodium: 139 mmol/L (ref 135–145)
Total Bilirubin: 0.7 mg/dL (ref 0.3–1.2)
Total Protein: 7.2 g/dL (ref 6.5–8.1)

## 2017-06-11 LAB — CBC WITH DIFFERENTIAL/PLATELET
Basophils Absolute: 0 10*3/uL (ref 0.0–0.1)
Basophils Relative: 0 %
Eosinophils Absolute: 0.1 10*3/uL (ref 0.0–0.7)
Eosinophils Relative: 2 %
HCT: 41.2 % (ref 39.0–52.0)
Hemoglobin: 13.4 g/dL (ref 13.0–17.0)
Lymphocytes Relative: 15 %
Lymphs Abs: 0.9 10*3/uL (ref 0.7–4.0)
MCH: 31.5 pg (ref 26.0–34.0)
MCHC: 32.5 g/dL (ref 30.0–36.0)
MCV: 96.9 fL (ref 78.0–100.0)
Monocytes Absolute: 0.5 10*3/uL (ref 0.1–1.0)
Monocytes Relative: 8 %
Neutro Abs: 4.3 10*3/uL (ref 1.7–7.7)
Neutrophils Relative %: 75 %
Platelets: 174 10*3/uL (ref 150–400)
RBC: 4.25 MIL/uL (ref 4.22–5.81)
RDW: 14.7 % (ref 11.5–15.5)
WBC: 5.8 10*3/uL (ref 4.0–10.5)

## 2017-06-11 LAB — LACTATE DEHYDROGENASE: LDH: 136 U/L (ref 98–192)

## 2017-06-11 MED ORDER — SODIUM CHLORIDE 0.9% FLUSH
10.0000 mL | INTRAVENOUS | Status: DC | PRN
  Administered 2017-06-11: 10 mL
  Filled 2017-06-11: qty 10

## 2017-06-11 MED ORDER — SODIUM CHLORIDE 0.9 % IV SOLN
Freq: Once | INTRAVENOUS | Status: AC
  Administered 2017-06-11: 11:00:00 via INTRAVENOUS

## 2017-06-11 MED ORDER — HEPARIN SOD (PORK) LOCK FLUSH 100 UNIT/ML IV SOLN
500.0000 [IU] | Freq: Once | INTRAVENOUS | Status: AC | PRN
  Administered 2017-06-11: 500 [IU]

## 2017-06-11 MED ORDER — FAMOTIDINE IN NACL 20-0.9 MG/50ML-% IV SOLN
20.0000 mg | Freq: Once | INTRAVENOUS | Status: AC
Start: 2017-06-11 — End: 2017-06-11
  Administered 2017-06-11: 20 mg via INTRAVENOUS

## 2017-06-11 MED ORDER — DEXAMETHASONE SODIUM PHOSPHATE 100 MG/10ML IJ SOLN
20.0000 mg | Freq: Once | INTRAMUSCULAR | Status: AC
  Administered 2017-06-11: 20 mg via INTRAVENOUS
  Filled 2017-06-11: qty 2

## 2017-06-11 MED ORDER — DEXTROSE 5 % IV SOLN
45.0000 mg/m2 | Freq: Once | INTRAVENOUS | Status: AC
  Administered 2017-06-11: 90 mg via INTRAVENOUS
  Filled 2017-06-11: qty 15

## 2017-06-11 MED ORDER — DIPHENHYDRAMINE HCL 50 MG/ML IJ SOLN
50.0000 mg | Freq: Once | INTRAMUSCULAR | Status: AC
  Administered 2017-06-11: 50 mg via INTRAVENOUS

## 2017-06-11 MED ORDER — PALONOSETRON HCL INJECTION 0.25 MG/5ML
INTRAVENOUS | Status: AC
  Filled 2017-06-11: qty 5

## 2017-06-11 MED ORDER — COLD PACK MISC ONCOLOGY: 1.0000 | Freq: Once | Status: DC | PRN

## 2017-06-11 MED ORDER — DIPHENHYDRAMINE HCL 50 MG/ML IJ SOLN
INTRAMUSCULAR | Status: AC
  Filled 2017-06-11: qty 1

## 2017-06-11 MED ORDER — SODIUM CHLORIDE 0.9 % IV SOLN
192.8000 mg | Freq: Once | INTRAVENOUS | Status: AC
  Administered 2017-06-11: 190 mg via INTRAVENOUS
  Filled 2017-06-11: qty 19

## 2017-06-11 MED ORDER — FAMOTIDINE IN NACL 20-0.9 MG/50ML-% IV SOLN
INTRAVENOUS | Status: AC
  Filled 2017-06-11: qty 50

## 2017-06-11 MED ORDER — PALONOSETRON HCL INJECTION 0.25 MG/5ML
0.2500 mg | Freq: Once | INTRAVENOUS | Status: AC
  Administered 2017-06-11: 0.25 mg via INTRAVENOUS

## 2017-06-11 NOTE — Patient Instructions (Signed)
[facility] Discharge Instructions for Patients Receiving Chemotherapy   Beginning January 23rd 2017 lab work for the Cancer Center will be done in the  Main lab at [facility] on 1st floor. If you have a lab appointment with the Cancer Center please come in thru the  Main Entrance and check in at the main information desk   Today you received the following chemotherapy agents Taxol and Carboplatin. Follow-up as scheduled. Call clinic for any questions or concerns  To help prevent nausea and vomiting after your treatment, we encourage you to take your nausea medication.   If you develop nausea and vomiting, or diarrhea that is not controlled by your medication, call the clinic.  The clinic phone number is [phone]. Office hours are Monday-Friday 8:30am-5:00pm.  BELOW ARE SYMPTOMS THAT SHOULD BE REPORTED IMMEDIATELY:  *FEVER GREATER THAN 101.0 F  *CHILLS WITH OR WITHOUT FEVER  NAUSEA AND VOMITING THAT IS NOT CONTROLLED WITH YOUR NAUSEA MEDICATION  *UNUSUAL SHORTNESS OF BREATH  *UNUSUAL BRUISING OR BLEEDING  TENDERNESS IN MOUTH AND THROAT WITH OR WITHOUT PRESENCE OF ULCERS  *URINARY PROBLEMS  *BOWEL PROBLEMS  UNUSUAL RASH Items with * indicate a potential emergency and should be followed up as soon as possible. If you have an emergency after office hours please contact your primary care physician or go to the nearest emergency department.  Please call the clinic during office hours if you have any questions or concerns.   You may also contact the Patient Navigator at [phone] should you have any questions or need assistance in obtaining follow up care.      Resources For Cancer Patients and their Caregivers ? American Cancer Society: Can assist with transportation, wigs, general needs, runs Look Good Feel Better.        1-888-227-6333 ? Cancer Care: Provides financial assistance, online support groups, medication/co-pay assistance.   1-800-813-HOPE (4673) ? [facility] Assists [place] cancer patients and their families through emotional , educational and financial support.  336-427-4357 ? [place] DSS Where to apply for food stamps, Medicaid and utility assistance. 336-342-1394 ? RCATS: Transportation to medical appointments. 336-347-2287 ? Social Security Administration: May apply for disability if have a Stage IV cancer. 336-342-7796 1-800-772-1213 ? [place] Aging, Disability and Transit Services: Assists with nutrition, care and transit needs. 336-349-2343         

## 2017-06-11 NOTE — Progress Notes (Signed)
Ian Aguilar reviewed with Dr. Delton Coombes and pt approved for chemo tx today per MD.                            Ian Aguilar tolerated chemo tx well without complaints or incident. Reviewed medications with pt as they were given today with understanding verbalized. VSS upon discharge. Pt discharged via wheelchair in satisfactory condition accompanied by his wife

## 2017-06-12 ENCOUNTER — Encounter (HOSPITAL_COMMUNITY): Payer: Self-pay | Admitting: General Surgery

## 2017-06-12 ENCOUNTER — Telehealth (HOSPITAL_COMMUNITY): Payer: Self-pay

## 2017-06-12 NOTE — Telephone Encounter (Signed)
24 hr Chemo F/U call. Pt reports he is doing well. He denies any issues at this time. Pt instructed to call for any questions or problems and verbalized understanding

## 2017-06-14 ENCOUNTER — Other Ambulatory Visit (HOSPITAL_COMMUNITY): Payer: PPO

## 2017-06-14 ENCOUNTER — Ambulatory Visit (HOSPITAL_COMMUNITY): Payer: PPO

## 2017-06-14 ENCOUNTER — Ambulatory Visit (HOSPITAL_COMMUNITY): Payer: PPO | Admitting: Hematology

## 2017-06-18 ENCOUNTER — Other Ambulatory Visit: Payer: Self-pay

## 2017-06-18 ENCOUNTER — Encounter (HOSPITAL_COMMUNITY): Payer: Self-pay | Admitting: Hematology

## 2017-06-18 ENCOUNTER — Encounter (HOSPITAL_COMMUNITY): Payer: Self-pay

## 2017-06-18 ENCOUNTER — Inpatient Hospital Stay (HOSPITAL_BASED_OUTPATIENT_CLINIC_OR_DEPARTMENT_OTHER): Payer: PPO | Admitting: Hematology

## 2017-06-18 ENCOUNTER — Inpatient Hospital Stay (HOSPITAL_COMMUNITY): Payer: PPO

## 2017-06-18 VITALS — BP 134/50 | HR 72 | Temp 97.5°F | Resp 20 | Wt 177.3 lb

## 2017-06-18 DIAGNOSIS — C3490 Malignant neoplasm of unspecified part of unspecified bronchus or lung: Secondary | ICD-10-CM

## 2017-06-18 DIAGNOSIS — Z5111 Encounter for antineoplastic chemotherapy: Secondary | ICD-10-CM | POA: Diagnosis not present

## 2017-06-18 DIAGNOSIS — G620 Drug-induced polyneuropathy: Secondary | ICD-10-CM | POA: Diagnosis not present

## 2017-06-18 DIAGNOSIS — F17210 Nicotine dependence, cigarettes, uncomplicated: Secondary | ICD-10-CM

## 2017-06-18 DIAGNOSIS — T451X5A Adverse effect of antineoplastic and immunosuppressive drugs, initial encounter: Secondary | ICD-10-CM | POA: Diagnosis not present

## 2017-06-18 DIAGNOSIS — C771 Secondary and unspecified malignant neoplasm of intrathoracic lymph nodes: Secondary | ICD-10-CM | POA: Diagnosis not present

## 2017-06-18 DIAGNOSIS — C3411 Malignant neoplasm of upper lobe, right bronchus or lung: Secondary | ICD-10-CM | POA: Diagnosis not present

## 2017-06-18 LAB — COMPREHENSIVE METABOLIC PANEL
ALT: 19 U/L (ref 17–63)
AST: 28 U/L (ref 15–41)
Albumin: 3.9 g/dL (ref 3.5–5.0)
Alkaline Phosphatase: 62 U/L (ref 38–126)
Anion gap: 10 (ref 5–15)
BUN: 20 mg/dL (ref 6–20)
CO2: 29 mmol/L (ref 22–32)
Calcium: 9.2 mg/dL (ref 8.9–10.3)
Chloride: 99 mmol/L — ABNORMAL LOW (ref 101–111)
Creatinine, Ser: 0.79 mg/dL (ref 0.61–1.24)
GFR calc Af Amer: >60 mL/min (ref >60)
GFR calc non Af Amer: >60 mL/min (ref >60)
Glucose, Bld: 96 mg/dL (ref 65–99)
Potassium: 4.7 mmol/L (ref 3.5–5.1)
Sodium: 138 mmol/L (ref 135–145)
Total Bilirubin: 1.2 mg/dL (ref 0.3–1.2)
Total Protein: 7.1 g/dL (ref 6.5–8.1)

## 2017-06-18 LAB — CBC WITH DIFFERENTIAL/PLATELET
Basophils Absolute: 0 10*3/uL (ref 0.0–0.1)
Basophils Relative: 0 %
Eosinophils Absolute: 0.1 10*3/uL (ref 0.0–0.7)
Eosinophils Relative: 1 %
HCT: 39.2 % (ref 39.0–52.0)
Hemoglobin: 12.8 g/dL — ABNORMAL LOW (ref 13.0–17.0)
Lymphocytes Relative: 15 %
Lymphs Abs: 0.7 10*3/uL (ref 0.7–4.0)
MCH: 31.6 pg (ref 26.0–34.0)
MCHC: 32.7 g/dL (ref 30.0–36.0)
MCV: 96.8 fL (ref 78.0–100.0)
Monocytes Absolute: 0.2 10*3/uL (ref 0.1–1.0)
Monocytes Relative: 4 %
Neutro Abs: 3.8 10*3/uL (ref 1.7–7.7)
Neutrophils Relative %: 80 %
Platelets: 171 10*3/uL (ref 150–400)
RBC: 4.05 MIL/uL — ABNORMAL LOW (ref 4.22–5.81)
RDW: 14.4 % (ref 11.5–15.5)
WBC: 4.7 10*3/uL (ref 4.0–10.5)

## 2017-06-18 MED ORDER — PALONOSETRON HCL INJECTION 0.25 MG/5ML
INTRAVENOUS | Status: AC
  Filled 2017-06-18: qty 5

## 2017-06-18 MED ORDER — CARBOPLATIN CHEMO INJECTION 450 MG/45ML
192.8000 mg | Freq: Once | INTRAVENOUS | Status: AC
  Administered 2017-06-18: 190 mg via INTRAVENOUS
  Filled 2017-06-18: qty 19

## 2017-06-18 MED ORDER — DIPHENHYDRAMINE HCL 50 MG/ML IJ SOLN
INTRAMUSCULAR | Status: AC
  Filled 2017-06-18: qty 1

## 2017-06-18 MED ORDER — FAMOTIDINE IN NACL 20-0.9 MG/50ML-% IV SOLN
INTRAVENOUS | Status: AC
  Filled 2017-06-18: qty 50

## 2017-06-18 MED ORDER — DIPHENHYDRAMINE HCL 50 MG/ML IJ SOLN
50.0000 mg | Freq: Once | INTRAMUSCULAR | Status: AC
  Administered 2017-06-18: 50 mg via INTRAVENOUS

## 2017-06-18 MED ORDER — HEPARIN SOD (PORK) LOCK FLUSH 100 UNIT/ML IV SOLN
500.0000 [IU] | Freq: Once | INTRAVENOUS | Status: AC | PRN
  Administered 2017-06-18: 500 [IU]
  Filled 2017-06-18: qty 5

## 2017-06-18 MED ORDER — SODIUM CHLORIDE 0.9 % IV SOLN
20.0000 mg | Freq: Once | INTRAVENOUS | Status: AC
  Administered 2017-06-18: 20 mg via INTRAVENOUS
  Filled 2017-06-18: qty 2

## 2017-06-18 MED ORDER — SODIUM CHLORIDE 0.9% FLUSH
10.0000 mL | INTRAVENOUS | Status: DC | PRN
  Administered 2017-06-18: 10 mL
  Filled 2017-06-18: qty 10

## 2017-06-18 MED ORDER — PACLITAXEL CHEMO INJECTION 300 MG/50ML
45.0000 mg/m2 | Freq: Once | INTRAVENOUS | Status: AC
  Administered 2017-06-18: 90 mg via INTRAVENOUS
  Filled 2017-06-18: qty 15

## 2017-06-18 MED ORDER — FAMOTIDINE IN NACL 20-0.9 MG/50ML-% IV SOLN
20.0000 mg | Freq: Once | INTRAVENOUS | Status: AC
  Administered 2017-06-18: 20 mg via INTRAVENOUS

## 2017-06-18 MED ORDER — PALONOSETRON HCL INJECTION 0.25 MG/5ML
0.2500 mg | Freq: Once | INTRAVENOUS | Status: AC
  Administered 2017-06-18: 0.25 mg via INTRAVENOUS

## 2017-06-18 MED ORDER — SODIUM CHLORIDE 0.9 % IV SOLN
Freq: Once | INTRAVENOUS | Status: AC
  Administered 2017-06-18: 500 mL via INTRAVENOUS

## 2017-06-18 NOTE — Patient Instructions (Signed)
[facility] Discharge Instructions for Patients Receiving Chemotherapy  Today you received the following chemotherapy agents carboplatin and taxol.    If you develop nausea and vomiting that is not controlled by your nausea medication, call the clinic.   BELOW ARE SYMPTOMS THAT SHOULD BE REPORTED IMMEDIATELY:  *FEVER GREATER THAN 100.5 F  *CHILLS WITH OR WITHOUT FEVER  NAUSEA AND VOMITING THAT IS NOT CONTROLLED WITH YOUR NAUSEA MEDICATION  *UNUSUAL SHORTNESS OF BREATH  *UNUSUAL BRUISING OR BLEEDING  TENDERNESS IN MOUTH AND THROAT WITH OR WITHOUT PRESENCE OF ULCERS  *URINARY PROBLEMS  *BOWEL PROBLEMS  UNUSUAL RASH Items with * indicate a potential emergency and should be followed up as soon as possible.  Feel free to call the clinic should you have any questions or concerns. The clinic phone number is [phone].  Please show the [facility] at check-in to the Emergency Department and triage nurse.

## 2017-06-18 NOTE — Progress Notes (Signed)
To treatment area for oncology follow up and chemotherapy.  Patient stated constipation and using OTC medications for relief.  No changes in SOB.  History of COPD.  Bruising noted below port from port placement with incision site clean and dry with no drainage at site.  No complaints of pain with port or flush.    Patient tolerated treatment with no complaints voiced.  Port site clean and dry with no bruising or swelling noted at site.  No complaints of pain with flush.  Good blood return noted before and after treatment.  Band aid applied.  VSS with discharge and left via wheelchair.  No s/s of distress noted.

## 2017-06-18 NOTE — Patient Instructions (Signed)
[facility] at [facility] Discharge Instructions  Today you saw Dr. K.   Thank you for choosing [facility] at [facility] to provide your oncology and hematology care.  To afford each patient quality time with our provider, please arrive at least 15 minutes before your scheduled appointment time.   If you have a lab appointment with the Cancer Center please come in thru the  Main Entrance and check in at the main information desk  You need to re-schedule your appointment should you arrive 10 or more minutes late.  We strive to give you quality time with our providers, and arriving late affects you and other patients whose appointments are after yours.  Also, if you no show three or more times for appointments you may be dismissed from the clinic at the providers discretion.     Again, thank you for choosing [facility].  Our hope is that these requests will decrease the amount of time that you wait before being seen by our physicians.       _____________________________________________________________  Should you have questions after your visit to [facility], please contact our office at [phone] between the hours of 8:30 a.m. and 4:30 p.m.  Voicemails left after 4:30 p.m. will not be returned until the following business day.  For prescription refill requests, have your pharmacy contact our office.       Resources For Cancer Patients and their Caregivers ? American Cancer Society: Can assist with transportation, wigs, general needs, runs Look Good Feel Better.        1-888-227-6333 ? Cancer Care: Provides financial assistance, online support groups, medication/co-pay assistance.  1-800-813-HOPE (4673) ? [facility] Assists [place] cancer patients and their families through emotional , educational and financial support.  336-427-4357 ? [place] DSS Where to apply for food  stamps, Medicaid and utility assistance. 336-342-1394 ? RCATS: Transportation to medical appointments. 336-347-2287 ? Social Security Administration: May apply for disability if have a Stage IV cancer. 336-342-7796 1-800-772-1213 ? [place] Aging, Disability and Transit Services: Assists with nutrition, care and transit needs. 336-349-2343  Cancer Center Support Programs:   > Cancer Support Group  2nd Tuesday of the month 1pm-2pm, Journey Room   > Creative Journey  3rd Tuesday of the month 1130am-1pm, Journey Room    

## 2017-06-18 NOTE — Assessment & Plan Note (Addendum)
1.  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 -Started on chemo radiation therapy on 06/11/2017 with weekly carboplatin and paclitaxel. -Tolerated first cycle very well except mild fatigue.  His blood counts are adequate to proceed with week #2 today.  No dose modifications necessary.  I will reevaluate him in 1 week.  2.  Neuropathy: He developed tingling in feet one day after chemotherapy.  This tingling lasted few hours.  We will keep a close eye on it.

## 2017-06-18 NOTE — Progress Notes (Signed)
[place], [place] [address]   CLINIC:  Medical Oncology/Hematology  PCP:  Ian Aguilar, [place] [address] [phone]   REASON FOR VISIT:  Follow-up for stage III lung cancer.  CURRENT THERAPY: Weekly carboplatin and paclitaxel.   BRIEF ONCOLOGIC HISTORY:    Carcinoma, lung ([facility])   06/05/2017 Initial Diagnosis    Carcinoma, lung ([facility])      06/05/2017 -  Chemotherapy    The patient had palonosetron (ALOXI) injection 0.25 mg, 0.25 mg, Intravenous,  Once, 2 of 6 cycles Administration: 0.25 mg (06/11/2017), 0.25 mg (06/18/2017) CARBOplatin (PARAPLATIN) 190 mg in sodium chloride 0.9 % 250 mL chemo infusion, 190 mg (100 % of original dose 192.8 mg), Intravenous,  Once, 2 of 6 cycles Dose modification:   (original dose 192.8 mg, Cycle 1),   (original dose 192.8 mg, Cycle 2) Administration: 190 mg (06/11/2017), 190 mg (06/18/2017) PACLitaxel (TAXOL) 90 mg in dextrose 5 % 250 mL chemo infusion (</= 65m/m2), 45 mg/m2 = 90 mg, Intravenous,  Once, 2 of 6 cycles Administration: 90 mg (06/11/2017), 90 mg (06/18/2017)  for chemotherapy treatment.         CANCER STAGING: Cancer Staging No matching staging information was found for the patient.   INTERVAL HISTORY:  Mr. [age]Seidner89y.o. male returns for routine follow-up and consideration for next cycle of chemotherapy.  He received his first weekly dose of carboplatin and paclitaxel on 06/11/2017.  His radiation was started on the same day.  He noticed slight fatigue.  He denied any nausea, vomiting, diarrhea or constipation.  One day after chemotherapy, he noticed tingling in his feet which lasted few hours.  He denies any tingling or numbness today.  His appetite has been stable.  He is drinking 1 can of Ensure daily.  He denies any headaches or vision changes.    REVIEW OF SYSTEMS:  Review of Systems  Constitutional: Positive for fatigue.  Cardiovascular: Negative.     Gastrointestinal: Negative.   Genitourinary: Negative.    Musculoskeletal: Negative.   Skin: Negative.   Neurological: Positive for numbness.  All other systems reviewed and are negative.    PAST MEDICAL/SURGICAL HISTORY:  Past Medical History:  Diagnosis Date  . Anxiety   . Arthritis   . Cirrhosis ([facility]    confirmed by MRI on 07/04/11.  no immunizations  . COPD (chronic obstructive pulmonary disease) ([facility]   . Depression with anxiety   . ETOH abuse   . Hyperlipidemia   . Nodule of upper lobe of right lung    with mediastinal adenopathy  . Wears dentures   . Wears glasses    Past Surgical History:  Procedure Laterality Date  . bilateral cataract surgery     [place]  . broken arm  6 yrs ago   left otif of wrist-[place]  . CLOSED REDUCTION WRIST FRACTURE Right 09/02/2015   Procedure: RIGHT WRIST REDUCTION;  Surgeon: TRenette Butters MD;  [facility]  Service: Orthopedics;  Laterality: Right;  with MAC  . COLONOSCOPY  1996   [facility]: external hemorrhoids, no polyps  . COLONOSCOPY  06/28/2011   Dr. RAla Bentdiverticulosis,tubular adenoma, hyperplastic polyp  . COLONOSCOPY WITH PROPOFOL N/A 04/26/2017   Procedure: COLONOSCOPY WITH PROPOFOL;  Surgeon: RDaneil Dolin MD;  [facility];  Service: Endoscopy;  Laterality: N/A;  2:00PM - spoke with pt's wife, knows to arrive at 11:45  . ESOPHAGOGASTRODUODENOSCOPY  06/28/2011   Dr.  Rourk-mild erosive reflux esophagitis, hiatal hernia-gastritis  . ESOPHAGOGASTRODUODENOSCOPY (EGD) WITH PROPOFOL N/A 04/26/2017   Procedure: ESOPHAGOGASTRODUODENOSCOPY (EGD) WITH PROPOFOL;  Surgeon: Daneil Dolin, MD;  [facility];  Service: Endoscopy;  Laterality: N/A;  . EXTERNAL EAR SURGERY     right ear-cleaned out ear and created new eardrum  . INGUINAL HERNIA REPAIR  2-3 yrs ago   left-[place]-[facility]_  . INTRAMEDULLARY (IM) NAIL INTERTROCHANTERIC Right 09/02/2015   Procedure: RIGHT  INTERTROCHANTRIC HIP;  Surgeon:  Renette Butters, MD;  Location: [facility];  Service: Orthopedics;  Laterality: Right;  With MAC  . KNEE SURGERY     left-arthroscopy-[place]  . PORTACATH PLACEMENT Left 06/08/2017   Procedure: INSERTION POWER PORT WITH  ATTACHED CATHETER LEFT SUBCLAVIAN;  Surgeon: Aviva Signs, MD;  Location: [facility];  Service: General;  Laterality: Left;  . SKULL FRACTURE ELEVATION     fractured cheeck bone repaired  . TOTAL HIP ARTHROPLASTY  12/18/2011   Procedure: TOTAL HIP ARTHROPLASTY;  Surgeon: Carole Civil, MD;  Location: [facility];  Service: Orthopedics;  Laterality: Left;  Marland Kitchen VIDEO BRONCHOSCOPY WITH ENDOBRONCHIAL ULTRASOUND N/A 05/04/2017   Procedure: VIDEO BRONCHOSCOPY WITH ENDOBRONCHIAL ULTRASOUND;  Surgeon: Melrose Nakayama, MD;  Location: [facility];  Service: Thoracic;  Laterality: N/A;  . WISDOM TOOTH EXTRACTION       SOCIAL HISTORY:  Social History   Socioeconomic History  . Marital status: Married    Spouse name: Not on file  . Number of children: Not on file  . Years of education: Not on file  . Highest education level: Not on file  Occupational History  . Occupation: retired    Fish farm manager: [employer]  Social Needs  . Financial resource strain: Not on file  . Food insecurity:    Worry: Not on file    Inability: Not on file  . Transportation needs:    Medical: Not on file    Non-medical: Not on file  Tobacco Use  . Smoking status: Current Every Day Smoker    Packs/day: 0.50    Years: 62.00    Pack years: 31.00    Types: Cigarettes  . Smokeless tobacco: Never Used  . Tobacco comment: 1/2 ppd > 60 years  Substance and Sexual Activity  . Alcohol use: Yes    Comment: history of ETOH abuse, stopped 1.5 years ago (had one drink at [place] [employer]).   . Drug use: No  . Sexual activity: Yes    Birth control/protection: None  Lifestyle  . Physical activity:    Days per week: Not on file    Minutes per session: Not on file  . Stress: Not on file  Relationships  .  Social connections:    Talks on phone: Not on file    Gets together: Not on file    Attends religious service: Not on file    Active member of club or organization: Not on file    Attends meetings of clubs or organizations: Not on file    Relationship status: Not on file  . Intimate partner violence:    Fear of current or ex partner: Not on file    Emotionally abused: Not on file    Physically abused: Not on file    Forced sexual activity: Not on file  Other Topics Concern  . Not on file  Social History Narrative  . Not on file    FAMILY HISTORY:  Family History  Problem Relation Age of Onset  .  Stroke Mother 29  . Heart disease Father 87       MI  . Diabetes Maternal Uncle   . Colon cancer Neg Hx   . Cancer Neg Hx     CURRENT MEDICATIONS:  Outpatient Encounter Medications as of 06/18/2017  Medication Sig Note  . CARBOPLATIN IV Inject into the vein. Weekly during radiation 05/31/2017: Not started therapy course yet   . cholecalciferol (VITAMIN D) 1000 units tablet Take 1,000 Units by mouth daily.   Marland Kitchen HYDROcodone-acetaminophen (NORCO) 5-325 MG tablet Take 1 tablet by mouth every 6 (six) hours as needed for moderate pain.   Marland Kitchen lidocaine-prilocaine (EMLA) cream Apply a quarter size amount to affected area 1 hour prior to coming to chemotherapy.  Do not rub in.  Cover with plastic wrap. 05/31/2017: Not started therapy course yet  . Nutritional Supplements (ENSURE ENLIVE PO) Take 237 mLs by mouth daily at 2 PM.    . ondansetron (ZOFRAN) 8 MG tablet Take 1 tablet (8 mg total) by mouth every 8 (eight) hours as needed for nausea or vomiting. 05/31/2017: Not started therapy course yet   . PACLitaxel (TAXOL IV) Inject into the vein. Weekly during radiation 05/31/2017: Not started therapy course yet   . PROAIR HFA 108 (90 Base) MCG/ACT inhaler Inhale 2 puffs into the lungs every 6 (six) hours as needed (for shortness of breath/wheezing/cough).    . prochlorperazine (COMPAZINE) 10 MG tablet Take 1  tablet (10 mg total) by mouth every 6 (six) hours as needed for nausea or vomiting. 05/31/2017: Not started therapy course yet   . Propylene Glycol-Glycerin (SOOTHE) 0.6-0.6 % SOLN Place 1-2 drops into both eyes 3 (three) times daily as needed (for dry/irritated eyes.).   Marland Kitchen Skin Protectants, Misc. (EUCERIN) cream Apply 1 application topically 4 (four) times daily as needed for dry skin (for itchy or dry skin).   . Tiotropium Bromide Monohydrate (SPIRIVA RESPIMAT) 2.5 MCG/ACT AERS Inhale 2 puffs into the lungs every evening.     No facility-administered encounter medications on file as of 06/18/2017.     ALLERGIES:  No Known Allergies   PHYSICAL EXAM:  ECOG Performance status: 1  I have reviewed vital signs. Physical Exam   LABORATORY DATA:  I have reviewed the labs as listed.  CBC    Component Value Date/Time   WBC 4.7 06/18/2017 1011   RBC 4.05 (L) 06/18/2017 1011   HGB 12.8 (L) 06/18/2017 1011   HCT 39.2 06/18/2017 1011   PLT 171 06/18/2017 1011   MCV 96.8 06/18/2017 1011   MCH 31.6 06/18/2017 1011   MCHC 32.7 06/18/2017 1011   RDW 14.4 06/18/2017 1011   LYMPHSABS 0.7 06/18/2017 1011   MONOABS 0.2 06/18/2017 1011   EOSABS 0.1 06/18/2017 1011   BASOSABS 0.0 06/18/2017 1011   CMP Latest Ref Rng & Units 06/18/2017 06/11/2017 05/22/2017  Glucose 65 - 99 mg/dL 96 88 76  BUN 6 - 20 mg/dL _0 Creatinine 0.61 - 1.24 mg/dL 0.79 0.82 0.91  Sodium 135 - 145 mmol/L 138 139 142  Potassium 3.5 - 5.1 mmol/L 4.7 4.1 4.2  Chloride 101 - 111 mmol/L 99(L) 101 102  CO2 22 - 32 mmol/L _1 Calcium 8.9 - 10.3 mg/dL 9.2 9.1 9.3  Total Protein 6.5 - 8.1 g/dL 7.1 7.2 7.5  Total Bilirubin 0.3 - 1.2 mg/dL 1.2 0.7 0.8  Alkaline Phos 38 - 126 U/L 62 65 83  AST 15 - 41 U/L 28 27 27  ALT 17 - 63 U/L 19 14(L) 18       DIAGNOSTIC IMAGING:  I have reviewed his PET CT scan.    ASSESSMENT & PLAN:   Carcinoma, lung ([facility]) 1.  Stage IIIa (T1BN2) right upper lobe  adenocarcinoma: -PET/CT scan with 1.2 cm right upper lobe lung nodule with 1.2 cm biopsy-proven right paratracheal (4R) node by EBUS on 05/04/2017 -Small focus of moderate increased uptake in the L4 transverse process, cannot rule out metastasis. -Started on chemo radiation therapy on 06/11/2017 with weekly carboplatin and paclitaxel. -Tolerated first cycle very well except mild fatigue.  His blood counts are adequate to proceed with week #2 today.  No dose modifications necessary.  I will reevaluate him in 1 week.  2.  Neuropathy: He developed tingling in feet one day after chemotherapy.  This tingling lasted few hours.  We will keep a close eye on it.      Orders placed this encounter:  Orders Placed This Encounter  Procedures  . CBC with Differential  . Comprehensive metabolic panel  . Magnesium      Derek Jack, MD [place] [phone]

## 2017-06-20 DIAGNOSIS — C3411 Malignant neoplasm of upper lobe, right bronchus or lung: Secondary | ICD-10-CM | POA: Diagnosis not present

## 2017-06-21 ENCOUNTER — Ambulatory Visit (HOSPITAL_COMMUNITY): Payer: PPO

## 2017-06-21 ENCOUNTER — Ambulatory Visit (HOSPITAL_COMMUNITY): Payer: PPO | Admitting: Hematology

## 2017-06-25 ENCOUNTER — Other Ambulatory Visit: Payer: Self-pay

## 2017-06-25 ENCOUNTER — Inpatient Hospital Stay (HOSPITAL_COMMUNITY): Payer: PPO

## 2017-06-25 ENCOUNTER — Inpatient Hospital Stay (HOSPITAL_BASED_OUTPATIENT_CLINIC_OR_DEPARTMENT_OTHER): Payer: PPO | Admitting: Hematology

## 2017-06-25 ENCOUNTER — Encounter (HOSPITAL_COMMUNITY): Payer: Self-pay | Admitting: Hematology

## 2017-06-25 VITALS — BP 152/60 | HR 74 | Temp 98.0°F | Resp 18 | Wt 177.0 lb

## 2017-06-25 VITALS — BP 151/65 | HR 70 | Temp 97.7°F | Resp 18

## 2017-06-25 DIAGNOSIS — Z5111 Encounter for antineoplastic chemotherapy: Secondary | ICD-10-CM | POA: Diagnosis not present

## 2017-06-25 DIAGNOSIS — G620 Drug-induced polyneuropathy: Secondary | ICD-10-CM

## 2017-06-25 DIAGNOSIS — F17210 Nicotine dependence, cigarettes, uncomplicated: Secondary | ICD-10-CM

## 2017-06-25 DIAGNOSIS — C3490 Malignant neoplasm of unspecified part of unspecified bronchus or lung: Secondary | ICD-10-CM

## 2017-06-25 DIAGNOSIS — T451X5A Adverse effect of antineoplastic and immunosuppressive drugs, initial encounter: Secondary | ICD-10-CM | POA: Diagnosis not present

## 2017-06-25 DIAGNOSIS — C3411 Malignant neoplasm of upper lobe, right bronchus or lung: Secondary | ICD-10-CM

## 2017-06-25 DIAGNOSIS — K5900 Constipation, unspecified: Secondary | ICD-10-CM

## 2017-06-25 DIAGNOSIS — C3491 Malignant neoplasm of unspecified part of right bronchus or lung: Secondary | ICD-10-CM

## 2017-06-25 LAB — CBC WITH DIFFERENTIAL/PLATELET
Basophils Absolute: 0 10*3/uL (ref 0.0–0.1)
Basophils Relative: 0 %
Eosinophils Absolute: 0 10*3/uL (ref 0.0–0.7)
Eosinophils Relative: 1 %
HCT: 39 % (ref 39.0–52.0)
Hemoglobin: 12.7 g/dL — ABNORMAL LOW (ref 13.0–17.0)
Lymphocytes Relative: 15 %
Lymphs Abs: 0.6 10*3/uL — ABNORMAL LOW (ref 0.7–4.0)
MCH: 31.6 pg (ref 26.0–34.0)
MCHC: 32.6 g/dL (ref 30.0–36.0)
MCV: 97 fL (ref 78.0–100.0)
Monocytes Absolute: 0.3 10*3/uL (ref 0.1–1.0)
Monocytes Relative: 8 %
Neutro Abs: 2.9 10*3/uL (ref 1.7–7.7)
Neutrophils Relative %: 76 %
Platelets: 184 10*3/uL (ref 150–400)
RBC: 4.02 MIL/uL — ABNORMAL LOW (ref 4.22–5.81)
RDW: 14.5 % (ref 11.5–15.5)
WBC: 3.8 10*3/uL — ABNORMAL LOW (ref 4.0–10.5)

## 2017-06-25 LAB — COMPREHENSIVE METABOLIC PANEL
ALT: 20 U/L (ref 17–63)
AST: 26 U/L (ref 15–41)
Albumin: 3.8 g/dL (ref 3.5–5.0)
Alkaline Phosphatase: 58 U/L (ref 38–126)
Anion gap: 10 (ref 5–15)
BUN: 19 mg/dL (ref 6–20)
CO2: 29 mmol/L (ref 22–32)
Calcium: 9.2 mg/dL (ref 8.9–10.3)
Chloride: 99 mmol/L — ABNORMAL LOW (ref 101–111)
Creatinine, Ser: 0.89 mg/dL (ref 0.61–1.24)
GFR calc Af Amer: >60 mL/min (ref >60)
GFR calc non Af Amer: >60 mL/min (ref >60)
Glucose, Bld: 94 mg/dL (ref 65–99)
Potassium: 4.7 mmol/L (ref 3.5–5.1)
Sodium: 138 mmol/L (ref 135–145)
Total Bilirubin: 0.9 mg/dL (ref 0.3–1.2)
Total Protein: 7.2 g/dL (ref 6.5–8.1)

## 2017-06-25 LAB — MAGNESIUM: Magnesium: 2.1 mg/dL (ref 1.7–2.4)

## 2017-06-25 MED ORDER — SODIUM CHLORIDE 0.9 % IV SOLN
Freq: Once | INTRAVENOUS | Status: AC
  Administered 2017-06-25: 11:00:00 via INTRAVENOUS

## 2017-06-25 MED ORDER — DEXTROSE 5 % IV SOLN
40.5000 mg/m2 | Freq: Once | INTRAVENOUS | Status: AC
  Administered 2017-06-25: 78 mg via INTRAVENOUS
  Filled 2017-06-25: qty 13

## 2017-06-25 MED ORDER — FAMOTIDINE IN NACL 20-0.9 MG/50ML-% IV SOLN
INTRAVENOUS | Status: AC
  Filled 2017-06-25: qty 50

## 2017-06-25 MED ORDER — SODIUM CHLORIDE 0.9 % IV SOLN
192.8000 mg | Freq: Once | INTRAVENOUS | Status: AC
  Administered 2017-06-25: 190 mg via INTRAVENOUS
  Filled 2017-06-25: qty 19

## 2017-06-25 MED ORDER — SODIUM CHLORIDE 0.9% FLUSH
10.0000 mL | INTRAVENOUS | Status: DC | PRN
  Administered 2017-06-25: 10 mL
  Filled 2017-06-25: qty 10

## 2017-06-25 MED ORDER — PALONOSETRON HCL INJECTION 0.25 MG/5ML
INTRAVENOUS | Status: AC
  Filled 2017-06-25: qty 5

## 2017-06-25 MED ORDER — PALONOSETRON HCL INJECTION 0.25 MG/5ML
0.2500 mg | Freq: Once | INTRAVENOUS | Status: AC
  Administered 2017-06-25: 0.25 mg via INTRAVENOUS

## 2017-06-25 MED ORDER — DIPHENHYDRAMINE HCL 50 MG/ML IJ SOLN
50.0000 mg | Freq: Once | INTRAMUSCULAR | Status: AC
  Administered 2017-06-25: 50 mg via INTRAVENOUS

## 2017-06-25 MED ORDER — FAMOTIDINE IN NACL 20-0.9 MG/50ML-% IV SOLN
20.0000 mg | Freq: Once | INTRAVENOUS | Status: AC
  Administered 2017-06-25: 20 mg via INTRAVENOUS

## 2017-06-25 MED ORDER — HEPARIN SOD (PORK) LOCK FLUSH 100 UNIT/ML IV SOLN
500.0000 [IU] | Freq: Once | INTRAVENOUS | Status: AC | PRN
  Administered 2017-06-25: 500 [IU]

## 2017-06-25 MED ORDER — SODIUM CHLORIDE 0.9 % IV SOLN
20.0000 mg | Freq: Once | INTRAVENOUS | Status: AC
  Administered 2017-06-25: 20 mg via INTRAVENOUS
  Filled 2017-06-25: qty 2

## 2017-06-25 MED ORDER — DIPHENHYDRAMINE HCL 50 MG/ML IJ SOLN
INTRAMUSCULAR | Status: AC
  Filled 2017-06-25: qty 1

## 2017-06-25 NOTE — Progress Notes (Signed)
Tolerated infusions w/o adverse reaction.  Alert, in no distress.  VSS.  Discharged via wheelchair in c/o spouse.

## 2017-06-25 NOTE — Assessment & Plan Note (Signed)
1.  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 -Started on chemo radiation therapy on 06/11/2017 with weekly carboplatin and paclitaxel. -Tolerated last cycle reasonably well.  Did not develop any dysphagia yet.  Appetite is good.  He had some uneasy feeling in the stomach for the last 3 days.  His constipation is controlled with stool softener.  He will proceed with week 3 today.  I have reviewed his labs.  We will reevaluate him in 1 week.  2.  Neuropathy: He developed tingling in feet one day after week 1 of chemotherapy.  This tingling lasted few hours.  He did not have any tingling after week 2.  However his legs felt heavy.  This has gotten better over the last 5 days but he still feels his legs are heavy.  It is starting to affect his balance when he gets up in the morning.  He had balance issues for many years.  It has gotten slightly worse since last 1 week.  Hence I will cut back Taxol dose by 10%.

## 2017-06-25 NOTE — Progress Notes (Signed)
[address], [place] [address]   CLINIC:  Medical Oncology/Hematology  PCP:  Sharilyn Sites, [place] [id number] [phone]   REASON FOR VISIT:  Follow-up for lung cancer.  CURRENT THERAPY: Weekly carboplatin and paclitaxel.  BRIEF ONCOLOGIC HISTORY:    Carcinoma, lung ([place])   06/05/2017 Initial Diagnosis    Carcinoma, lung ([place])      06/05/2017 -  Chemotherapy    The patient had palonosetron (ALOXI) injection 0.25 mg, 0.25 mg, Intravenous,  Once, 2 of 6 cycles Administration: 0.25 mg (06/11/2017), 0.25 mg (06/18/2017) CARBOplatin (PARAPLATIN) 190 mg in sodium chloride 0.9 % 250 mL chemo infusion, 190 mg (100 % of original dose 192.8 mg), Intravenous,  Once, 2 of 6 cycles Dose modification:   (original dose 192.8 mg, Cycle 1),   (original dose 192.8 mg, Cycle 2),   (Cycle 3) Administration: 190 mg (06/11/2017), 190 mg (06/18/2017) PACLitaxel (TAXOL) 90 mg in dextrose 5 % 250 mL chemo infusion (</= 27m/m2), 45 mg/m2 = 90 mg, Intravenous,  Once, 2 of 6 cycles Dose modification: 40.5 mg/m2 (90 % of original dose 45 mg/m2, Cycle 5, Reason: Other (see comments), Comment: early neuropathy) Administration: 90 mg (06/11/2017), 90 mg (06/18/2017)  for chemotherapy treatment.         CANCER STAGING: Cancer Staging No matching staging information was found for the patient.   INTERVAL HISTORY:  Mr. JPollan89y.o. male returns for routine follow-up and consideration for next cycle of chemotherapy.   He is here for week 3 of chemotherapy.  After last week treatment, he noticed that his legs felt heavy.  This is mildly affecting his balance when he gets up in the morning.  Denies any nausea or vomiting.  He has constipation which is managed with stool softener.  No diarrhea was reported.  Appetite was good.  He gained few pounds.  He denies any difficulty or painful swallowing.  No fevers or infections was reported.  REVIEW OF  SYSTEMS:  Review of Systems  Constitutional: Negative.   HENT:  Negative.   Musculoskeletal: Positive for gait problem.  Neurological: Positive for gait problem and numbness.  All other systems reviewed and are negative.    PAST MEDICAL/SURGICAL HISTORY:  Past Medical History:  Diagnosis Date  . Anxiety   . Arthritis   . Cirrhosis ([place]    confirmed by MRI on 07/04/11.  no immunizations  . COPD [place]ch[place] obstructive pulmonary disease) ([place]   . Depression with anxiety   . ETOH abuse   . Hyperlipidemia   . Nodule of upper lobe of right lung    with mediastinal adenopathy  . Wears dentures   . Wears glasses    Past Surgical History:  Procedure Laterality Date  . bilateral cataract surgery     [place]  . broken arm  6 yrs ago   left otif of wrist-[facility]  . CLOSED REDUCTION WRIST FRACTURE Right 09/02/2015   Procedure: RIGHT WRIST REDUCTION;  Surgeon: TRenette Butters MD;  [place]  Service: Orthopedics;  Laterality: Right;  with MAC  . COLONOSCOPY  1996   [facility]: external hemorrhoids, no polyps  . COLONOSCOPY  06/28/2011   Dr. RAla Bentdiverticulosis,tubular adenoma, hyperplastic polyp  . COLONOSCOPY WITH PROPOFOL N/A 04/26/2017   Procedure: COLONOSCOPY WITH PROPOFOL;  Surgeon: RDaneil Dolin MD;  [facility];  Service: Endoscopy;  Laterality: N/A;  2:00PM - spoke with pt's wife, knows to arrive at  11:56  . ESOPHAGOGASTRODUODENOSCOPY  06/28/2011   Dr. Chelsea Aus erosive reflux esophagitis, hiatal hernia-gastritis  . ESOPHAGOGASTRODUODENOSCOPY (EGD) WITH PROPOFOL N/A 04/26/2017   Procedure: ESOPHAGOGASTRODUODENOSCOPY (EGD) WITH PROPOFOL;  Surgeon: Daneil Dolin, MD;  [facility];  Service: Endoscopy;  Laterality: N/A;  . EXTERNAL EAR SURGERY     right ear-cleaned out ear and created new eardrum  . INGUINAL HERNIA REPAIR  2-3 yrs ago   left-[facility]  . INTRAMEDULLARY (IM) NAIL INTERTROCHANTERIC Right 09/02/2015   Procedure: RIGHT   INTERTROCHANTRIC HIP;  Surgeon: Renette Butters, MD;  Location: [place];  Service: Orthopedics;  Laterality: Right;  With MAC  . KNEE SURGERY     left-arthroscopy-[place]  . PORTACATH PLACEMENT Left 06/08/2017   Procedure: INSERTION POWER PORT WITH  ATTACHED CATHETER LEFT SUBCLAVIAN;  Surgeon: Aviva Signs, MD;  [facility];  Service: General;  Laterality: Left;  . SKULL FRACTURE ELEVATION     fractured cheeck bone repaired  . TOTAL HIP ARTHROPLASTY  12/18/2011   Procedure: TOTAL HIP ARTHROPLASTY;  Surgeon: Carole Civil, MD;  [facility];  Service: Orthopedics;  Laterality: Left;  Marland Kitchen VIDEO BRONCHOSCOPY WITH ENDOBRONCHIAL ULTRASOUND N/A 05/04/2017   Procedure: VIDEO BRONCHOSCOPY WITH ENDOBRONCHIAL ULTRASOUND;  Surgeon: Melrose Nakayama, MD;  Location: [facility];  Service: Thoracic;  Laterality: N/A;  . WISDOM TOOTH EXTRACTION       SOCIAL HISTORY:  Social History   Socioeconomic History  . Marital status: Married    Spouse name: Not on file  . Number of children: Not on file  . Years of education: Not on file  . Highest education level: Not on file  Occupational History  . Occupation: retired    Fish farm manager: [employer]  Social Needs  . Financial resource strain: Not on file  . Food insecurity:    Worry: Not on file    Inability: Not on file  . Transportation needs:    Medical: Not on file    Non-medical: Not on file  Tobacco Use  . Smoking status: Current Every Day Smoker    Packs/day: 0.50    Years: 62.00    Pack years: 31.00    Types: Cigarettes  . Smokeless tobacco: Never Used  . Tobacco comment: 1/2 ppd > 60 years  Substance and Sexual Activity  . Alcohol use: Yes    Comment: history of ETOH abuse, stopped 1.5 years ago (had one drink at the superbowl).   . Drug use: No  . Sexual activity: Yes    Birth control/protection: None  Lifestyle  . Physical activity:    Days per week: Not on file    Minutes per session: Not on file  . Stress:  Not on file  Relationships  . Social connections:    Talks on phone: Not on file    Gets together: Not on file    Attends religious service: Not on file    Active member of club or organization: Not on file    Attends meetings of clubs or organizations: Not on file    Relationship status: Not on file  . Intimate partner violence:    Fear of current or ex partner: Not on file    Emotionally abused: Not on file    Physically abused: Not on file    Forced sexual activity: Not on file  Other Topics Concern  . Not on file  Social History Narrative  . Not on file    FAMILY HISTORY:  Family History  Problem Relation Age of Onset  . Stroke Mother 63  . Heart disease Father 82       MI  . Diabetes Maternal Uncle   . Colon cancer Neg Hx   . Cancer Neg Hx     CURRENT MEDICATIONS:  Outpatient Encounter Medications as of 06/25/2017  Medication Sig Note  . CARBOPLATIN IV Inject into the vein. Weekly during radiation 05/31/2017: Not started therapy course yet   . cholecalciferol (VITAMIN D) 1000 units tablet Take 1,000 Units by mouth daily.   Marland Kitchen lidocaine-prilocaine (EMLA) cream Apply a quarter size amount to affected area 1 hour prior to coming to chemotherapy.  Do not rub in.  Cover with plastic wrap. 05/31/2017: Not started therapy course yet  . Nutritional Supplements (ENSURE ENLIVE PO) Take 237 mLs by mouth daily at 2 PM.    . ondansetron (ZOFRAN) 8 MG tablet Take 1 tablet (8 mg total) by mouth every 8 (eight) hours as needed for nausea or vomiting. 05/31/2017: Not started therapy course yet   . PACLitaxel (TAXOL IV) Inject into the vein. Weekly during radiation 05/31/2017: Not started therapy course yet   . PROAIR HFA 108 (90 Base) MCG/ACT inhaler Inhale 2 puffs into the lungs every 6 (six) hours as needed (for shortness of breath/wheezing/cough).    . prochlorperazine (COMPAZINE) 10 MG tablet Take 1 tablet (10 mg total) by mouth every 6 (six) hours as needed for nausea or vomiting. 05/31/2017:  Not started therapy course yet   . Propylene Glycol-Glycerin (SOOTHE) 0.6-0.6 % SOLN Place 1-2 drops into both eyes 3 (three) times daily as needed (for dry/irritated eyes.).   Marland Kitchen Skin Protectants, Misc. (EUCERIN) cream Apply 1 application topically 4 (four) times daily as needed for dry skin (for itchy or dry skin).   . Tiotropium Bromide Monohydrate (SPIRIVA RESPIMAT) 2.5 MCG/ACT AERS Inhale 2 puffs into the lungs every evening.    . [DISCONTINUED] HYDROcodone-acetaminophen (NORCO) 5-325 MG tablet Take 1 tablet by mouth every 6 (six) hours as needed for moderate pain.    No facility-administered encounter medications on file as of 06/25/2017.     ALLERGIES:  No Known Allergies   PHYSICAL EXAM:  ECOG Performance status: 1  Vitals:   06/25/17 1035  BP: (!) 152/60  Pulse: 74  Resp: 18  Temp: 98 F (36.7 C)  SpO2: 96%   Filed Weights   06/25/17 1035  Weight: 177 lb (80.3 kg)    Physical Exam   LABORATORY DATA:  I have reviewed the labs as listed.  CBC    Component Value Date/Time   WBC 3.8 (L) 06/25/2017 0942   RBC 4.02 (L) 06/25/2017 0942   HGB 12.7 (L) 06/25/2017 0942   HCT 39.0 06/25/2017 0942   PLT 184 06/25/2017 0942   MCV 97.0 06/25/2017 0942   MCH 31.6 06/25/2017 0942   MCHC 32.6 06/25/2017 0942   RDW 14.5 06/25/2017 0942   LYMPHSABS 0.6 (L) 06/25/2017 0942   MONOABS 0.3 06/25/2017 0942   EOSABS 0.0 06/25/2017 0942   BASOSABS 0.0 06/25/2017 0942   CMP Latest Ref Rng & Units 06/25/2017 06/18/2017 06/11/2017  Glucose 65 - 99 mg/dL 94 96 88  BUN 6 - 20 mg/dL _0 Creatinine 0.61 - 1.24 mg/dL 0.89 0.79 0.82  Sodium 135 - 145 mmol/L 138 138 139  Potassium 3.5 - 5.1 mmol/L 4.7 4.7 4.1  Chloride 101 - 111 mmol/L 99(L) 99(L) 101  CO2 22 - 32 mmol/L 29 29  27  Calcium 8.9 - 10.3 mg/dL 9.2 9.2 9.1  Total Protein 6.5 - 8.1 g/dL 7.2 7.1 7.2  Total Bilirubin 0.3 - 1.2 mg/dL 0.9 1.2 0.7  Alkaline Phos 38 - 126 U/L 58 62 65  AST 15 - 41 U/L _0 ALT 17 - 63  U/L 20 19 14(L)          ASSESSMENT & PLAN:   Carcinoma, lung (HCC) 1.  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 -Started on chemo radiation therapy on 06/11/2017 with weekly carboplatin and paclitaxel. -Tolerated last cycle reasonably well.  Did not develop any dysphagia yet.  Appetite is good.  He had some uneasy feeling in the stomach for the last 3 days.  His constipation is controlled with stool softener.  He will proceed with week 3 today.  I have reviewed his labs.  We will reevaluate him in 1 week.  2.  Neuropathy: He developed tingling in feet one day after week 1 of chemotherapy.  This tingling lasted few hours.  He did not have any tingling after week 2.  However his legs felt heavy.  This has gotten better over the last 5 days but he still feels his legs are heavy.  It is starting to affect his balance when he gets up in the morning.  He had balance issues for many years.  It has gotten slightly worse since last 1 week.  Hence I will cut back Taxol dose by 10%.      Orders placed this encounter:  Orders Placed This Encounter  Procedures  . CBC with Differential  . Comprehensive metabolic panel      Derek Jack, MD [place] [phone]

## 2017-06-25 NOTE — Patient Instructions (Signed)
[facility] at [facility] Discharge Instructions  Today you saw Dr. K.   Thank you for choosing [facility] at [facility] to provide your oncology and hematology care.  To afford each patient quality time with our provider, please arrive at least 15 minutes before your scheduled appointment time.   If you have a lab appointment with the Cancer Center please come in thru the  Main Entrance and check in at the main information desk  You need to re-schedule your appointment should you arrive 10 or more minutes late.  We strive to give you quality time with our providers, and arriving late affects you and other patients whose appointments are after yours.  Also, if you no show three or more times for appointments you may be dismissed from the clinic at the providers discretion.     Again, thank you for choosing [facility].  Our hope is that these requests will decrease the amount of time that you wait before being seen by our physicians.       _____________________________________________________________  Should you have questions after your visit to [facility], please contact our office at [phone] between the hours of 8:30 a.m. and 4:30 p.m.  Voicemails left after 4:30 p.m. will not be returned until the following business day.  For prescription refill requests, have your pharmacy contact our office.       Resources For Cancer Patients and their Caregivers ? American Cancer Society: Can assist with transportation, wigs, general needs, runs Look Good Feel Better.        1-888-227-6333 ? Cancer Care: Provides financial assistance, online support groups, medication/co-pay assistance.  1-800-813-HOPE (4673) ? [facility] Assists [place] cancer patients and their families through emotional , educational and financial support.  336-427-4357 ? [place] DSS Where to apply for food  stamps, Medicaid and utility assistance. 336-342-1394 ? [facility]: Transportation to medical appointments. 336-347-2287 ? Social Security Administration: May apply for disability if have a Stage IV cancer. 336-342-7796 1-800-772-1213 ? [place] Aging, Disability and Transit Services: Assists with nutrition, care and transit needs. 336-349-2343  Cancer Center Support Programs:   > Cancer Support Group  2nd Tuesday of the month 1pm-2pm, Journey Room   > Creative Journey  3rd Tuesday of the month 1130am-1pm, Journey Room    

## 2017-06-27 ENCOUNTER — Ambulatory Visit: Payer: PPO | Admitting: Gastroenterology

## 2017-06-27 ENCOUNTER — Encounter: Payer: Self-pay | Admitting: Gastroenterology

## 2017-06-27 VITALS — BP 152/70 | HR 86 | Temp 96.7°F | Ht 69.0 in | Wt 179.0 lb

## 2017-06-27 DIAGNOSIS — C3411 Malignant neoplasm of upper lobe, right bronchus or lung: Secondary | ICD-10-CM | POA: Diagnosis not present

## 2017-06-27 DIAGNOSIS — K7030 Alcoholic cirrhosis of liver without ascites: Secondary | ICD-10-CM

## 2017-06-27 DIAGNOSIS — C3490 Malignant neoplasm of unspecified part of unspecified bronchus or lung: Secondary | ICD-10-CM | POA: Diagnosis not present

## 2017-06-27 NOTE — Progress Notes (Signed)
cc'ed to pcp °

## 2017-06-27 NOTE — Patient Instructions (Signed)
I will talk to the oncologist regarding the interval for next imaging.  We will see you in 6 months!  It was a pleasure to see you today. I strive to create trusting relationships with patients to provide genuine, compassionate, and quality care. I value your feedback. If you receive a survey regarding your visit,  I greatly appreciate you taking time to fill this out.   Annitta Needs, PhD, ANP-BC [facility]

## 2017-06-27 NOTE — Progress Notes (Signed)
Primary Care Physician:  Sharilyn Sites, MD  Primary GI: Dr. Gala Romney   Chief Complaint  Patient presents with  . pp f/u    doing ok    HPI:   Ian Aguilar is a 77 y.o. 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 EGD due again in 2021. Colonoscopy recently completed. Needs Hep A/B vaccination as he has never had this completed per his report. Undergoing chemoradiation for newly diagnosed right upper lobe lung carcinoma. Stage IIIa. May 2013 MRI abdomen with/without contrast with two tiny cystic lesions in head and proximal body of pancreas, appearing benign but needs follow-up. Due for cirrhosis surveillance as well now.   Some "unsettled" feeling in stomach with chemo. Radiation 5 days a week, chemo once weekly. No dysphagia. Good appetite. If gets constipated, has some discomfort in abdomen. Takes OTC stool softener prn. Doesn't feel like he needs something daily. No rectal bleeding. Has had only 2 drinks in last 2 years.    Past Medical History:  Diagnosis Date  . Anxiety   . Arthritis   . Cirrhosis ([place])    confirmed by MRI on 07/04/11.  no immunizations  . COPD (chronic obstructive pulmonary disease) ([place])   . Depression with anxiety   . ETOH abuse   . Hyperlipidemia   . Nodule of upper lobe of right lung    with mediastinal adenopathy  . Wears dentures   . Wears glasses     Past Surgical History:  Procedure Laterality Date  . bilateral cataract surgery     [place]  . broken arm  6 yrs ago   left otif of wrist-[facility]  . CLOSED REDUCTION WRIST FRACTURE Right 09/02/2015   Procedure: RIGHT WRIST REDUCTION;  Surgeon: Renette Butters, MD;  Location: [place];  Service: Orthopedics;  Laterality: Right;  with MAC  . COLONOSCOPY  1996   [facility]: external hemorrhoids, no polyps  . COLONOSCOPY  06/28/2011   Dr. Ala Bent diverticulosis,tubular adenoma, hyperplastic polyp  . COLONOSCOPY WITH PROPOFOL N/A 04/26/2017   Dr. Gala Romney:  perianal and digital rectal examinations normal, diffusely congested colonic mucosa but otherwise normal  . ESOPHAGOGASTRODUODENOSCOPY  06/28/2011   Dr. Chelsea Aus erosive reflux esophagitis, hiatal hernia-gastritis  . ESOPHAGOGASTRODUODENOSCOPY (EGD) WITH PROPOFOL N/A 04/26/2017   Dr. Gala Romney: normal esophagus, small hiatal hernia, GAVE, portal hypertensive gastropathy, normal duodenal bulb and second portion, no specimens collected. 2 years screening   . EXTERNAL EAR SURGERY     right ear-cleaned out ear and created new eardrum  . INGUINAL HERNIA REPAIR  2-3 yrs ago   left-[facility]  . INTRAMEDULLARY (IM) NAIL INTERTROCHANTERIC Right 09/02/2015   Procedure: RIGHT  INTERTROCHANTRIC HIP;  Surgeon: Renette Butters, MD;  Location: [place];  Service: Orthopedics;  Laterality: Right;  With MAC  . KNEE SURGERY     left-arthroscopy-[place]  . PORTACATH PLACEMENT Left 06/08/2017   Procedure: INSERTION POWER PORT WITH  ATTACHED CATHETER LEFT SUBCLAVIAN;  Surgeon: Aviva Signs, MD;  [facility];  Service: General;  Laterality: Left;  . SKULL FRACTURE ELEVATION     fractured cheeck bone repaired  . TOTAL HIP ARTHROPLASTY  12/18/2011   Procedure: TOTAL HIP ARTHROPLASTY;  Surgeon: Carole Civil, MD;  [facility];  Service: Orthopedics;  Laterality: Left;  Marland Kitchen VIDEO BRONCHOSCOPY WITH ENDOBRONCHIAL ULTRASOUND N/A 05/04/2017   Procedure: VIDEO BRONCHOSCOPY WITH ENDOBRONCHIAL ULTRASOUND;  Surgeon: Melrose Nakayama, MD;  Location: [place];  Service: Thoracic;  Laterality: N/A;  . WISDOM TOOTH EXTRACTION      Current Outpatient Medications  Medication Sig Dispense Refill  . CARBOPLATIN IV Inject into the vein. Weekly during radiation    . cholecalciferol (VITAMIN D) 1000 units tablet Take 1,000 Units by mouth daily.    Marland Kitchen lidocaine-prilocaine (EMLA) cream Apply a quarter size amount to affected area 1 hour prior to coming to chemotherapy.  Do not rub in.  Cover with plastic wrap. 30 g 2  .  Nutritional Supplements (ENSURE ENLIVE PO) Take 237 mLs by mouth. 2-3/day    . ondansetron (ZOFRAN) 8 MG tablet Take 1 tablet (8 mg total) by mouth every 8 (eight) hours as needed for nausea or vomiting. 30 tablet 2  . PACLitaxel (TAXOL IV) Inject into the vein. Weekly during radiation    . PROAIR HFA 108 (90 Base) MCG/ACT inhaler Inhale 2 puffs into the lungs every 6 (six) hours as needed (for shortness of breath/wheezing/cough).   2  . Propylene Glycol-Glycerin (SOOTHE) 0.6-0.6 % SOLN Place 1-2 drops into both eyes 3 (three) times daily as needed (for dry/irritated eyes.).    Marland Kitchen Skin Protectants, Misc. (EUCERIN) cream Apply 1 application topically 4 (four) times daily as needed for dry skin (for itchy or dry skin).    . Tiotropium Bromide Monohydrate (SPIRIVA RESPIMAT) 2.5 MCG/ACT AERS Inhale 2 puffs into the lungs every evening.      No current facility-administered medications for this visit.     Allergies as of 06/27/2017  . (No Known Allergies)    Family History  Problem Relation Age of Onset  . Stroke Mother 44  . Heart disease Father 61       MI  . Diabetes Maternal Uncle   . Colon cancer Neg Hx   . Cancer Neg Hx     Social History   Socioeconomic History  . Marital status: Married    Spouse name: Not on file  . Number of children: Not on file  . Years of education: Not on file  . Highest education level: Not on file  Occupational History  . Occupation: retired    Fish farm manager: [employer]  Social Needs  . Financial resource strain: Not on file  . Food insecurity:    Worry: Not on file    Inability: Not on file  . Transportation needs:    Medical: Not on file    Non-medical: Not on file  Tobacco Use  . Smoking status: Current Every Day Smoker    Packs/day: 0.50    Years: 62.00    Pack years: 31.00    Types: Cigarettes  . Smokeless tobacco: Never Used  . Tobacco comment: 1/2 ppd > 60 years as of 06/27/17: 7 cigarettes or less a day  Substance and  Sexual Activity  . Alcohol use: Not Currently    Comment: history of ETOH abuse, stopped 1.5 years ago (had one drink at the superbowl).   . Drug use: No  . Sexual activity: Yes    Birth control/protection: None  Lifestyle  . Physical activity:    Days per week: Not on file    Minutes per session: Not on file  . Stress: Not on file  Relationships  . Social connections:    Talks on phone: Not on file    Gets together: Not on file    Attends religious service: Not on file    Active member of club or organization: Not on file    Attends  meetings of clubs or organizations: Not on file    Relationship status: Not on file  Other Topics Concern  . Not on file  Social History Narrative  . Not on file    Review of Systems: Gen: Denies fever, chills, anorexia. Denies fatigue, weakness, weight loss.  CV: Denies chest pain, palpitations, syncope, peripheral edema, and claudication. Resp: Denies dyspnea at rest, cough, wheezing, coughing up blood, and pleurisy. GI: see HPI  Derm: Denies rash, itching, dry skin Psych: Denies depression, anxiety, memory loss, confusion. No homicidal or suicidal ideation.  Heme: Denies bruising, bleeding, and enlarged lymph nodes.  Physical Exam: BP (!) 152/70   Pulse 86   Temp (!) 96.7 F (35.9 C) (Oral)   Ht 5' 9"  (1.753 m)   Wt 179 lb (81.2 kg)   BMI 26.43 kg/m  General:   Alert and oriented. No distress noted. Pleasant and cooperative.  Head:  Normocephalic and atraumatic. Eyes:  Conjuctiva clear without scleral icterus. Mouth:  Oral mucosa pink and moist.  Abdomen:  +BS, distended but soft. Umbilical hernia. Query obvious palpable hepatomegaly vs mass.  Msk:  Symmetrical without gross deformities. Normal posture. Extremities:  Without edema. Neurologic:  Alert and  oriented x4 Psych:  Alert and cooperative. Normal mood and affect.

## 2017-06-27 NOTE — Assessment & Plan Note (Addendum)
77 year old male with history of cirrhosis presumably related to ETOH abuse in past.  2 drinks in past 2 years and applauded on this. Currently followed by Oncology and undergoing chemotherapy and radiation due to newly diagnosed right upper lung carcinoma. History of pancreatic cysts felt to be benign noted in 2013 and not noted on recent PET. Due for routine cirrhosis imaging as well. On exam, palpable mass vs hepatomegaly: new from prior exam. He will be due for restaging soon. I am reaching out to Oncology regarding next timing of imaging and modality. May need to go ahead and pursue imaging now (ultrasound) vs more extensive imaging as appropriate due to exam findings today. Will plan on returning in 6 months. Next EGD in 2021.   Reviewed with Oncology: PET/CT Jan 2019 with hypertrophy of the caudate and lateral  segment of left lobe of liver. Upcoming scans in near future with Oncology: will keep with that schedule.

## 2017-06-28 ENCOUNTER — Ambulatory Visit (HOSPITAL_COMMUNITY): Payer: PPO

## 2017-06-28 ENCOUNTER — Ambulatory Visit (HOSPITAL_COMMUNITY): Payer: PPO | Admitting: Hematology

## 2017-07-02 ENCOUNTER — Inpatient Hospital Stay (HOSPITAL_COMMUNITY): Payer: PPO | Attending: Hematology

## 2017-07-02 ENCOUNTER — Other Ambulatory Visit: Payer: Self-pay

## 2017-07-02 ENCOUNTER — Inpatient Hospital Stay (HOSPITAL_COMMUNITY): Payer: PPO

## 2017-07-02 ENCOUNTER — Encounter (HOSPITAL_COMMUNITY): Payer: Self-pay

## 2017-07-02 ENCOUNTER — Telehealth: Payer: Self-pay | Admitting: Gastroenterology

## 2017-07-02 ENCOUNTER — Inpatient Hospital Stay (HOSPITAL_BASED_OUTPATIENT_CLINIC_OR_DEPARTMENT_OTHER): Payer: PPO | Admitting: Hematology

## 2017-07-02 ENCOUNTER — Encounter (HOSPITAL_COMMUNITY): Payer: Self-pay | Admitting: Hematology

## 2017-07-02 VITALS — BP 126/62 | HR 65 | Temp 97.6°F | Resp 18 | Wt 178.0 lb

## 2017-07-02 DIAGNOSIS — T451X5A Adverse effect of antineoplastic and immunosuppressive drugs, initial encounter: Secondary | ICD-10-CM | POA: Diagnosis not present

## 2017-07-02 DIAGNOSIS — R5383 Other fatigue: Secondary | ICD-10-CM | POA: Insufficient documentation

## 2017-07-02 DIAGNOSIS — C3490 Malignant neoplasm of unspecified part of unspecified bronchus or lung: Secondary | ICD-10-CM

## 2017-07-02 DIAGNOSIS — G620 Drug-induced polyneuropathy: Secondary | ICD-10-CM | POA: Insufficient documentation

## 2017-07-02 DIAGNOSIS — C3411 Malignant neoplasm of upper lobe, right bronchus or lung: Secondary | ICD-10-CM

## 2017-07-02 DIAGNOSIS — C3491 Malignant neoplasm of unspecified part of right bronchus or lung: Secondary | ICD-10-CM

## 2017-07-02 DIAGNOSIS — Z5111 Encounter for antineoplastic chemotherapy: Secondary | ICD-10-CM | POA: Insufficient documentation

## 2017-07-02 LAB — COMPREHENSIVE METABOLIC PANEL
ALT: 19 U/L (ref 17–63)
AST: 29 U/L (ref 15–41)
Albumin: 4.1 g/dL (ref 3.5–5.0)
Alkaline Phosphatase: 61 U/L (ref 38–126)
Anion gap: 6 (ref 5–15)
BUN: 16 mg/dL (ref 6–20)
CO2: 31 mmol/L (ref 22–32)
Calcium: 9 mg/dL (ref 8.9–10.3)
Chloride: 99 mmol/L — ABNORMAL LOW (ref 101–111)
Creatinine, Ser: 0.79 mg/dL (ref 0.61–1.24)
GFR calc Af Amer: >60 mL/min (ref >60)
GFR calc non Af Amer: >60 mL/min (ref >60)
Glucose, Bld: 94 mg/dL (ref 65–99)
Potassium: 4.5 mmol/L (ref 3.5–5.1)
Sodium: 136 mmol/L (ref 135–145)
Total Bilirubin: 0.8 mg/dL (ref 0.3–1.2)
Total Protein: 7.3 g/dL (ref 6.5–8.1)

## 2017-07-02 LAB — CBC WITH DIFFERENTIAL/PLATELET
Basophils Absolute: 0 10*3/uL (ref 0.0–0.1)
Basophils Relative: 0 %
Eosinophils Absolute: 0 10*3/uL (ref 0.0–0.7)
Eosinophils Relative: 0 %
HCT: 38.5 % — ABNORMAL LOW (ref 39.0–52.0)
Hemoglobin: 12.8 g/dL — ABNORMAL LOW (ref 13.0–17.0)
Lymphocytes Relative: 11 %
Lymphs Abs: 0.4 10*3/uL — ABNORMAL LOW (ref 0.7–4.0)
MCH: 32 pg (ref 26.0–34.0)
MCHC: 33.2 g/dL (ref 30.0–36.0)
MCV: 96.3 fL (ref 78.0–100.0)
Monocytes Absolute: 0.3 10*3/uL (ref 0.1–1.0)
Monocytes Relative: 8 %
Neutro Abs: 2.7 10*3/uL (ref 1.7–7.7)
Neutrophils Relative %: 81 %
Platelets: 171 10*3/uL (ref 150–400)
RBC: 4 MIL/uL — ABNORMAL LOW (ref 4.22–5.81)
RDW: 15 % (ref 11.5–15.5)
WBC: 3.3 10*3/uL — ABNORMAL LOW (ref 4.0–10.5)

## 2017-07-02 MED ORDER — DIPHENHYDRAMINE HCL 50 MG/ML IJ SOLN
50.0000 mg | Freq: Once | INTRAMUSCULAR | Status: AC
  Administered 2017-07-02: 50 mg via INTRAVENOUS

## 2017-07-02 MED ORDER — FAMOTIDINE IN NACL 20-0.9 MG/50ML-% IV SOLN
INTRAVENOUS | Status: AC
  Filled 2017-07-02: qty 50

## 2017-07-02 MED ORDER — SODIUM CHLORIDE 0.9 % IV SOLN
192.8000 mg | Freq: Once | INTRAVENOUS | Status: AC
  Administered 2017-07-02: 190 mg via INTRAVENOUS
  Filled 2017-07-02: qty 19

## 2017-07-02 MED ORDER — SODIUM CHLORIDE 0.9 % IV SOLN
20.0000 mg | Freq: Once | INTRAVENOUS | Status: AC
  Administered 2017-07-02: 20 mg via INTRAVENOUS
  Filled 2017-07-02: qty 2

## 2017-07-02 MED ORDER — DIPHENHYDRAMINE HCL 50 MG/ML IJ SOLN
INTRAMUSCULAR | Status: AC
  Filled 2017-07-02: qty 1

## 2017-07-02 MED ORDER — SODIUM CHLORIDE 0.9% FLUSH
10.0000 mL | INTRAVENOUS | Status: DC | PRN
  Administered 2017-07-02: 10 mL
  Filled 2017-07-02: qty 10

## 2017-07-02 MED ORDER — SODIUM CHLORIDE 0.9 % IV SOLN
Freq: Once | INTRAVENOUS | Status: AC
  Administered 2017-07-02: 11:00:00 via INTRAVENOUS

## 2017-07-02 MED ORDER — PALONOSETRON HCL INJECTION 0.25 MG/5ML
0.2500 mg | Freq: Once | INTRAVENOUS | Status: AC
  Administered 2017-07-02: 0.25 mg via INTRAVENOUS

## 2017-07-02 MED ORDER — FAMOTIDINE IN NACL 20-0.9 MG/50ML-% IV SOLN
20.0000 mg | Freq: Once | INTRAVENOUS | Status: AC
  Administered 2017-07-02: 20 mg via INTRAVENOUS

## 2017-07-02 MED ORDER — PALONOSETRON HCL INJECTION 0.25 MG/5ML
INTRAVENOUS | Status: AC
  Filled 2017-07-02: qty 5

## 2017-07-02 MED ORDER — HEPARIN SOD (PORK) LOCK FLUSH 100 UNIT/ML IV SOLN
500.0000 [IU] | Freq: Once | INTRAVENOUS | Status: AC | PRN
  Administered 2017-07-02: 500 [IU]

## 2017-07-02 MED ORDER — SODIUM CHLORIDE 0.9 % IV SOLN
40.5000 mg/m2 | Freq: Once | INTRAVENOUS | Status: AC
  Administered 2017-07-02: 78 mg via INTRAVENOUS
  Filled 2017-07-02: qty 13

## 2017-07-02 NOTE — Progress Notes (Signed)
Tolerated infusions w/o adverse reactions. Alert, in no distress.  VSS.  Discharged via wheelchair in c/o daughter.

## 2017-07-02 NOTE — Progress Notes (Signed)
[place] Agu[place], [place] [address]   CLINIC:  Medical Oncology/Hematology  PCP:  [place], [place] [id number] [phone]   REASON FOR VISIT:  Follow-up for lung cancer.  CURRENT THERAPY: Weekly carboplatin and paclitaxel along with radiation.  BRIEF ONCOLOGIC HISTORY:    Carcinoma, lung ([facility])   06/05/2017 Initial Diagnosis    Carcinoma, lung ([facility])      06/05/2017 -  Chemotherapy    The patient had palonosetron (ALOXI) injection 0.25 mg, 0.25 mg, Intravenous,  Once, 4 of 6 cycles Administration: 0.25 mg (06/11/2017), 0.25 mg (06/18/2017), 0.25 mg (06/25/2017) CARBOplatin (PARAPLATIN) 190 mg in sodium chloride 0.9 % 250 mL chemo infusion, 190 mg (100 % of original dose 192.8 mg), Intravenous,  Once, 4 of 6 cycles Dose modification:   (original dose 192.8 mg, Cycle 1),   (original dose 192.8 mg, Cycle 2),   (original dose 192.8 mg, Cycle 3),   (original dose 192.8 mg, Cycle 4) Administration: 190 mg (06/11/2017), 190 mg (06/18/2017), 190 mg (06/25/2017) PACLitaxel (TAXOL) 90 mg in dextrose 5 % 250 mL chemo infusion (</= 42m/m2), 45 mg/m2 = 90 mg, Intravenous,  Once, 4 of 6 cycles Dose modification: 40.5 mg/m2 (90 % of original dose 45 mg/m2, Cycle 5, Reason: Other (see comments), Comment: early neuropathy) Administration: 90 mg (06/11/2017), 90 mg (06/18/2017), 78 mg (06/25/2017)  for chemotherapy treatment.         CANCER STAGING: Cancer Staging No matching staging information was found for the patient.   INTERVAL HISTORY:  Mr. JUselman89y.o. male returns for routine follow-up and consideration for next cycle of chemotherapy.  He is due for week 4 of chemotherapy.  After his last cycle, he denied any problems including nausea, vomiting, diarrhea.  He is constipation is stable on stool softeners.  After we cut back on paclitaxel dose last time, his heaviness in the feet has improved.  He has occasional numbness.  He  denies any new onset pains.  Energy levels are stable.  Appetite has been good.  He is accompanied by his son and daughter from [place].  REVIEW OF SYSTEMS:  Review of Systems  Constitutional: Positive for fatigue.  Neurological: Positive for numbness.  All other systems reviewed and are negative.    PAST MEDICAL/SURGICAL HISTORY:  Past Medical History:  Diagnosis Date  . Anxiety   . Arthritis   . Cir[facility]osis ([facility]    confirmed by MRI on 07/04/11.  no immunizations  . COPD (chronic obstructive pulmonary disease) ([facility]   . Depression with anxiety   . ETOH abuse   . Hyperlipidemia   . Nodule of upper lobe of right lung    with mediastinal adenopathy  . Wears dentures   . Wears glasses    Past Surgical History:  Procedure Laterality Date  . bilateral cataract surgery     [place]  . broken arm  6 yrs ago   left otif of wrist-[place]  . CLOSED REDUCTION WRIST FRACTURE Right 09/02/2015   Procedure: RIGHT WRIST REDUCTION;  Surgeon: TRenette Butters MD;  [facility]  Service: Orthopedics;  Laterality: Right;  with MAC  . COLONOSCOPY  1996   [place]: external hemorrhoids, no polyps  . COLONOSCOPY  06/28/2011   Dr. RAla Bentdiverticulosis,tubular adenoma, hyperplastic polyp  . COLONOSCOPY WITH PROPOFOL N/A 04/26/2017   Dr. RGala Romney perianal and digital rectal examinations normal, diffusely congested colonic mucosa but otherwise normal  . ESOPHAGOGASTRODUODENOSCOPY  06/28/2011  Dr. Chelsea Aus erosive reflux esophagitis, hiatal hernia-gastritis  . ESOPHAGOGASTRODUODENOSCOPY (EGD) WITH PROPOFOL N/A 04/26/2017   Dr. Gala Romney: normal esophagus, small hiatal hernia, GAVE, portal hypertensive gastropathy, normal duodenal bulb and second portion, no specimens collected. 2 years screening   . EXTERNAL EAR SURGERY     right ear-cleaned out ear and created new eardrum  . INGUINAL HERNIA REPAIR  2-3 yrs ago   left-[facility]  . INTRAMEDULLARY (IM) NAIL INTERTROCHANTERIC Right 09/02/2015     Procedure: RIGHT  INTERTROCHANTRIC HIP;  Surgeon: Renette Butters, MD;  Location: [facility];  Service: Orthopedics;  Laterality: Right;  With MAC  . KNEE SURGERY     left-arthroscopy-[place]  . PORTACATH PLACEMENT Left 06/08/2017   Procedure: INSERTION POWER PORT WITH  ATTACHED CATHETER LEFT SUBCLAVIAN;  Surgeon: Aviva Signs, MD;  Location: [facility];  Service: General;  Laterality: Left;  . SKULL FRACTURE ELEVATION     fractured cheeck bone repaired  . TOTAL HIP ARTHROPLASTY  12/18/2011   Procedure: TOTAL HIP ARTHROPLASTY;  Surgeon: Carole Civil, MD;  Location: [facility];  Service: Orthopedics;  Laterality: Left;  Marland Kitchen VIDEO BRONCHOSCOPY WITH ENDOBRONCHIAL ULTRASOUND N/A 05/04/2017   Procedure: VIDEO BRONCHOSCOPY WITH ENDOBRONCHIAL ULTRASOUND;  Surgeon: Melrose Nakayama, MD;  Location: [facility];  Service: Thoracic;  Laterality: N/A;  . WISDOM TOOTH EXTRACTION       SOCIAL HISTORY:  Social History   Socioeconomic History  . Marital status: Married    Spouse name: Not on file  . Number of children: Not on file  . Years of education: Not on file  . Highest education level: Not on file  Occupational History  . Occupation: retired    Fish farm manager: [employer]  Social Needs  . Financial resource strain: Not on file  . Food insecurity:    Worry: Not on file    Inability: Not on file  . Transportation needs:    Medical: Not on file    Non-medical: Not on file  Tobacco Use  . Smoking status: Current Every Day Smoker    Packs/day: 0.50    Years: 62.00    Pack years: 31.00    Types: Cigarettes  . Smokeless tobacco: Never Used  . Tobacco comment: 1/2 ppd > 60 years as of 06/27/17: 7 cigarettes or less a day  Substance and Sexual Activity  . Alcohol use: Not Currently    Comment: history of ETOH abuse, stopped 1.5 years ago (had one drink [place] [employer]).   . Drug use: No  . Sexual activity: Yes    Birth control/protection: None  Lifestyle  . Physical activity:    Days  per week: Not on file    Minutes per session: Not on file  . Stress: Not on file  Relationships  . Social connections:    Talks on phone: Not on file    Gets together: Not on file    Attends religious service: Not on file    Active member of club or organization: Not on file    Attends meetings of clubs or organizations: Not on file    Relationship status: Not on file  . Intimate partner violence:    Fear of current or ex partner: Not on file    Emotionally abused: Not on file    Physically abused: Not on file    Forced sexual activity: Not on file  Other Topics Concern  . Not on file  Social History Narrative  . Not on file  FAMILY HISTORY:  Family History  Problem Relation Age of Onset  . Stroke Mother 4  . Heart disease Father 76       MI  . Diabetes Maternal Uncle   . Colon cancer Neg Hx   . Cancer Neg Hx     CURRENT MEDICATIONS:  Outpatient Encounter Medications as of 07/02/2017  Medication Sig Note  . CARBOPLATIN IV Inject into the vein. Weekly during radiation 05/31/2017: Not started therapy course yet   . cholecalciferol (VITAMIN D) 1000 units tablet Take 1,000 Units by mouth daily.   Marland Kitchen lidocaine-prilocaine (EMLA) cream Apply a quarter size amount to affected area 1 hour prior to coming to chemotherapy.  Do not rub in.  Cover with plastic wrap. 05/31/2017: Not started therapy course yet  . Nutritional Supplements (ENSURE ENLIVE PO) Take 237 mLs by mouth. 2-3/day   . ondansetron (ZOFRAN) 8 MG tablet Take 1 tablet (8 mg total) by mouth every 8 (eight) hours as needed for nausea or vomiting. 05/31/2017: Not started therapy course yet   . PACLitaxel (TAXOL IV) Inject into the vein. Weekly during radiation 05/31/2017: Not started therapy course yet   . PROAIR HFA 108 (90 Base) MCG/ACT inhaler Inhale 2 puffs into the lungs every 6 (six) hours as needed (for shortness of breath/wheezing/cough).    . Propylene Glycol-Glycerin (SOOTHE) 0.6-0.6 % SOLN Place 1-2 drops into both eyes  3 (three) times daily as needed (for dry/irritated eyes.).   Marland Kitchen Skin Protectants, Misc. (EUCERIN) cream Apply 1 application topically 4 (four) times daily as needed for dry skin (for itchy or dry skin).   . Tiotropium Bromide Monohydrate (SPIRIVA RESPIMAT) 2.5 MCG/ACT AERS Inhale 2 puffs into the lungs every evening.     No facility-administered encounter medications on file as of 07/02/2017.     ALLERGIES:  No Known Allergies   PHYSICAL EXAM:  ECOG Performance status: 1 I have reviewed his vital signs today. Physical Exam Deferred.  LABORATORY DATA:  I have reviewed the labs as listed.  CBC    Component Value Date/Time   WBC 3.3 (L) 07/02/2017 0949   RBC 4.00 (L) 07/02/2017 0949   HGB 12.8 (L) 07/02/2017 0949   HCT 38.5 (L) 07/02/2017 0949   PLT 171 07/02/2017 0949   MCV 96.3 07/02/2017 0949   MCH 32.0 07/02/2017 0949   MCHC 33.2 07/02/2017 0949   RDW 15.0 07/02/2017 0949   LYMPHSABS 0.4 (L) 07/02/2017 0949   MONOABS 0.3 07/02/2017 0949   EOSABS 0.0 07/02/2017 0949   BASOSABS 0.0 07/02/2017 0949   CMP Latest Ref Rng & Units 07/02/2017 06/25/2017 06/18/2017  Glucose 65 - 99 mg/dL 94 94 96  BUN 6 - 20 mg/dL _0 Creatinine 0.61 - 1.24 mg/dL 0.79 0.89 0.79  Sodium 135 - 145 mmol/L 136 138 138  Potassium 3.5 - 5.1 mmol/L 4.5 4.7 4.7  Chloride 101 - 111 mmol/L 99(L) 99(L) 99(L)  CO2 22 - 32 mmol/L _1 Calcium 8.9 - 10.3 mg/dL 9.0 9.2 9.2  Total Protein 6.5 - 8.1 g/dL 7.3 7.2 7.1  Total Bilirubin 0.3 - 1.2 mg/dL 0.8 0.9 1.2  Alkaline Phos 38 - 126 U/L 61 58 62  AST 15 - 41 U/L _2 ALT 17 - 63 U/L _3 ASSESSMENT & PLAN:   Malignant neoplasm of bronchus and lung (H[facility]) 1.  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 -Started on chemo radiation therapy on 06/11/2017 with weekly carboplatin and paclitaxel. -He has tolerated week 3 of treatment very well.  Denied any difficulty or painful swallowing.  Has occasional numbness in the feet.  Energy levels are stable.  Constipation is well controlled with stool softener.  Today he is accompanied by his son and daughter.  His heaviness in the foot has improved since we cut back on the paclitaxel.  He may proceed with week 4 of treatment today.  We will plan to repeat CT scans 4 weeks after completion of chemoradiation therapy.  We plan to initiate Durvalumab within 6 weeks of completion of combination therapy.  2.  Neuropathy: I have cut back on paclitaxel dose by 10% during week 3.  His heaviness in the legs have improved.  He has occasional numbness in the feet when he lies down a long time.      Orders placed this encounter:  Orders Placed This Encounter  Procedures  . CBC with Differential  . Comprehensive metabolic panel      Derek Jack, MD [place] [phone]

## 2017-07-02 NOTE — Telephone Encounter (Signed)
Lmom, waiting on a return call.  

## 2017-07-02 NOTE — Telephone Encounter (Signed)
Please let patient know I reviewed with Oncology about timing of next scan in light of known cirrhosis, likely hepatomegaly on exam: PET/CT Jan 2019 with hypertrophy of the caudate and lateral segment of left lobe of liver. Upcoming scans in near future with Oncology: will keep with that schedule.

## 2017-07-02 NOTE — Assessment & Plan Note (Signed)
1.  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 -Started on chemo radiation therapy on 06/11/2017 with weekly carboplatin and paclitaxel. -He has tolerated week 3 of treatment very well.  Denied any difficulty or painful swallowing.  Has occasional numbness in the feet.  Energy levels are stable.  Constipation is well controlled with stool softener.  Today he is accompanied by his son and daughter.  His heaviness in the foot has improved since we cut back on the paclitaxel.  He may proceed with week 4 of treatment today.  We will plan to repeat CT scans 4 weeks after completion of chemoradiation therapy.  We plan to initiate Durvalumab within 6 weeks of completion of combination therapy.  2.  Neuropathy: I have cut back on paclitaxel dose by 10% during week 3.  His heaviness in the legs have improved.  He has occasional numbness in the feet when he lies down a long time.

## 2017-07-04 DIAGNOSIS — C3411 Malignant neoplasm of upper lobe, right bronchus or lung: Secondary | ICD-10-CM | POA: Diagnosis not present

## 2017-07-04 NOTE — Telephone Encounter (Signed)
Pt notified that he will keep with schedule of oncology.

## 2017-07-05 ENCOUNTER — Ambulatory Visit (HOSPITAL_COMMUNITY): Payer: PPO

## 2017-07-05 ENCOUNTER — Ambulatory Visit (HOSPITAL_COMMUNITY): Payer: PPO | Admitting: Hematology

## 2017-07-09 ENCOUNTER — Inpatient Hospital Stay (HOSPITAL_COMMUNITY): Payer: PPO

## 2017-07-09 ENCOUNTER — Encounter (HOSPITAL_COMMUNITY): Payer: Self-pay | Admitting: Hematology

## 2017-07-09 ENCOUNTER — Other Ambulatory Visit: Payer: Self-pay

## 2017-07-09 ENCOUNTER — Inpatient Hospital Stay (HOSPITAL_COMMUNITY): Payer: PPO | Attending: Hematology

## 2017-07-09 ENCOUNTER — Encounter (HOSPITAL_COMMUNITY): Payer: Self-pay

## 2017-07-09 ENCOUNTER — Inpatient Hospital Stay (HOSPITAL_BASED_OUTPATIENT_CLINIC_OR_DEPARTMENT_OTHER): Payer: PPO | Admitting: Hematology

## 2017-07-09 VITALS — BP 146/55 | HR 77 | Temp 97.5°F | Resp 18 | Wt 177.6 lb

## 2017-07-09 DIAGNOSIS — T451X5A Adverse effect of antineoplastic and immunosuppressive drugs, initial encounter: Secondary | ICD-10-CM

## 2017-07-09 DIAGNOSIS — Z5111 Encounter for antineoplastic chemotherapy: Secondary | ICD-10-CM | POA: Diagnosis not present

## 2017-07-09 DIAGNOSIS — C3491 Malignant neoplasm of unspecified part of right bronchus or lung: Secondary | ICD-10-CM

## 2017-07-09 DIAGNOSIS — C3411 Malignant neoplasm of upper lobe, right bronchus or lung: Secondary | ICD-10-CM | POA: Insufficient documentation

## 2017-07-09 DIAGNOSIS — R5383 Other fatigue: Secondary | ICD-10-CM | POA: Diagnosis not present

## 2017-07-09 DIAGNOSIS — C3490 Malignant neoplasm of unspecified part of unspecified bronchus or lung: Secondary | ICD-10-CM

## 2017-07-09 DIAGNOSIS — G620 Drug-induced polyneuropathy: Secondary | ICD-10-CM | POA: Diagnosis not present

## 2017-07-09 LAB — COMPREHENSIVE METABOLIC PANEL
ALT: 19 U/L (ref 17–63)
AST: 25 U/L (ref 15–41)
Albumin: 3.9 g/dL (ref 3.5–5.0)
Alkaline Phosphatase: 56 U/L (ref 38–126)
Anion gap: 8 (ref 5–15)
BUN: 20 mg/dL (ref 6–20)
CO2: 31 mmol/L (ref 22–32)
Calcium: 9.1 mg/dL (ref 8.9–10.3)
Chloride: 100 mmol/L — ABNORMAL LOW (ref 101–111)
Creatinine, Ser: 0.79 mg/dL (ref 0.61–1.24)
GFR calc Af Amer: >60 mL/min (ref >60)
GFR calc non Af Amer: >60 mL/min (ref >60)
Glucose, Bld: 96 mg/dL (ref 65–99)
Potassium: 4.5 mmol/L (ref 3.5–5.1)
Sodium: 139 mmol/L (ref 135–145)
Total Bilirubin: 0.9 mg/dL (ref 0.3–1.2)
Total Protein: 7 g/dL (ref 6.5–8.1)

## 2017-07-09 LAB — CBC WITH DIFFERENTIAL/PLATELET
Basophils Absolute: 0 10*3/uL (ref 0.0–0.1)
Basophils Relative: 0 %
Eosinophils Absolute: 0 10*3/uL (ref 0.0–0.7)
Eosinophils Relative: 0 %
HCT: 36.8 % — ABNORMAL LOW (ref 39.0–52.0)
Hemoglobin: 12 g/dL — ABNORMAL LOW (ref 13.0–17.0)
Lymphocytes Relative: 11 %
Lymphs Abs: 0.3 10*3/uL — ABNORMAL LOW (ref 0.7–4.0)
MCH: 31.7 pg (ref 26.0–34.0)
MCHC: 32.6 g/dL (ref 30.0–36.0)
MCV: 97.1 fL (ref 78.0–100.0)
Monocytes Absolute: 0.2 10*3/uL (ref 0.1–1.0)
Monocytes Relative: 8 %
Neutro Abs: 2.4 10*3/uL (ref 1.7–7.7)
Neutrophils Relative %: 81 %
Platelets: 107 10*3/uL — ABNORMAL LOW (ref 150–400)
RBC: 3.79 MIL/uL — ABNORMAL LOW (ref 4.22–5.81)
RDW: 15.8 % — ABNORMAL HIGH (ref 11.5–15.5)
WBC: 3 10*3/uL — ABNORMAL LOW (ref 4.0–10.5)

## 2017-07-09 MED ORDER — DIPHENHYDRAMINE HCL 50 MG/ML IJ SOLN
50.0000 mg | Freq: Once | INTRAMUSCULAR | Status: AC
  Administered 2017-07-09: 50 mg via INTRAVENOUS

## 2017-07-09 MED ORDER — PALONOSETRON HCL INJECTION 0.25 MG/5ML
0.2500 mg | Freq: Once | INTRAVENOUS | Status: AC
  Administered 2017-07-09: 0.25 mg via INTRAVENOUS

## 2017-07-09 MED ORDER — PALONOSETRON HCL INJECTION 0.25 MG/5ML
INTRAVENOUS | Status: AC
Start: 2017-07-09 — End: ?
  Filled 2017-07-09: qty 5

## 2017-07-09 MED ORDER — DIPHENHYDRAMINE HCL 50 MG/ML IJ SOLN
INTRAMUSCULAR | Status: AC
  Filled 2017-07-09: qty 1

## 2017-07-09 MED ORDER — SODIUM CHLORIDE 0.9 % IV SOLN
20.0000 mg | Freq: Once | INTRAVENOUS | Status: AC
  Administered 2017-07-09: 20 mg via INTRAVENOUS
  Filled 2017-07-09: qty 2

## 2017-07-09 MED ORDER — SODIUM CHLORIDE 0.9 % IV SOLN
Freq: Once | INTRAVENOUS | Status: AC
  Administered 2017-07-09: 11:00:00 via INTRAVENOUS

## 2017-07-09 MED ORDER — FAMOTIDINE IN NACL 20-0.9 MG/50ML-% IV SOLN
20.0000 mg | Freq: Once | INTRAVENOUS | Status: AC
  Administered 2017-07-09: 20 mg via INTRAVENOUS

## 2017-07-09 MED ORDER — SODIUM CHLORIDE 0.9 % IV SOLN
40.5000 mg/m2 | Freq: Once | INTRAVENOUS | Status: AC
  Administered 2017-07-09: 78 mg via INTRAVENOUS
  Filled 2017-07-09: qty 13

## 2017-07-09 MED ORDER — FAMOTIDINE IN NACL 20-0.9 MG/50ML-% IV SOLN
INTRAVENOUS | Status: AC
  Filled 2017-07-09: qty 50

## 2017-07-09 MED ORDER — CARBOPLATIN CHEMO INJECTION 450 MG/45ML
192.8000 mg | Freq: Once | INTRAVENOUS | Status: AC
  Administered 2017-07-09: 190 mg via INTRAVENOUS
  Filled 2017-07-09: qty 19

## 2017-07-09 MED ORDER — HEPARIN SOD (PORK) LOCK FLUSH 100 UNIT/ML IV SOLN
500.0000 [IU] | Freq: Once | INTRAVENOUS | Status: AC | PRN
  Administered 2017-07-09: 500 [IU]
  Filled 2017-07-09: qty 5

## 2017-07-09 NOTE — Patient Instructions (Signed)
[facility] Discharge Instructions for Patients Receiving Chemotherapy   Beginning January 23rd 2017 lab work for the Cancer Center will be done in the  Main lab at [facility] on 1st floor. If you have a lab appointment with the Cancer Center please come in thru the  Main Entrance and check in at the main information desk   Today you received the following chemotherapy agents   To help prevent nausea and vomiting after your treatment, we encourage you to take your nausea medication     If you develop nausea and vomiting, or diarrhea that is not controlled by your medication, call the clinic.  The clinic phone number is [phone]. Office hours are Monday-Friday 8:30am-5:00pm.  BELOW ARE SYMPTOMS THAT SHOULD BE REPORTED IMMEDIATELY:  *FEVER GREATER THAN 101.0 F  *CHILLS WITH OR WITHOUT FEVER  NAUSEA AND VOMITING THAT IS NOT CONTROLLED WITH YOUR NAUSEA MEDICATION  *UNUSUAL SHORTNESS OF BREATH  *UNUSUAL BRUISING OR BLEEDING  TENDERNESS IN MOUTH AND THROAT WITH OR WITHOUT PRESENCE OF ULCERS  *URINARY PROBLEMS  *BOWEL PROBLEMS  UNUSUAL RASH Items with * indicate a potential emergency and should be followed up as soon as possible. If you have an emergency after office hours please contact your primary care physician or go to the nearest emergency department.  Please call the clinic during office hours if you have any questions or concerns.   You may also contact the Patient Navigator at [phone] should you have any questions or need assistance in obtaining follow up care.      Resources For Cancer Patients and their Caregivers ? American Cancer Society: Can assist with transportation, wigs, general needs, runs Look Good Feel Better.        1-888-227-6333 ? Cancer Care: Provides financial assistance, online support groups, medication/co-pay assistance.  1-800-813-HOPE (4673) ? [facility] Assists [place] cancer  patients and their families through emotional , educational and financial support.  336-427-4357 ? [place] DSS Where to apply for food stamps, Medicaid and utility assistance. 336-342-1394 ? [facility]: Transportation to medical appointments. 336-347-2287 ? Social Security Administration: May apply for disability if have a Stage IV cancer. 336-342-7796 1-800-772-1213 ? [place] Aging, Disability and Transit Services: Assists with nutrition, care and transit needs. 336-349-2343         

## 2017-07-09 NOTE — Progress Notes (Signed)
Treatment given per orders. Patient tolerated it well without problems. Vitals stable and discharged home from clinic ambulatory. Follow up as scheduled.  

## 2017-07-09 NOTE — Assessment & Plan Note (Addendum)
1.  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 -Started on chemo radiation therapy on 06/11/2017 with weekly carboplatin and paclitaxel. -He has tolerated week 4 of therapy very well.  His only complaint is mild fatigue.  His appetite has been great.  He will proceed with week 5 without any dose modifications.  He will come back in 1 week with labs and possible treatment.  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  We will plan to repeat CT scans 4 weeks after completion of chemoradiation therapy.  We plan to initiate Durvalumab within 6 weeks of completion of combination therapy.  2.  Neuropathy: I have cut back on paclitaxel dose by 10% during week 3.  His heaviness in the legs have improved.  He has occasional numbness in the feet when he lies down a long time.

## 2017-07-09 NOTE — Progress Notes (Signed)
Ian Aguilar, [place] [id number]   CLINIC:  Medical Oncology/Hematology  PCP:  Sharilyn Sites, [place] [address] [phone]   REASON FOR VISIT:  Follow-up for stage III lung cancer.  CURRENT THERAPY: Chemoradiation therapy.  BRIEF ONCOLOGIC HISTORY:    Carcinoma, lung ([place])   06/05/2017 Initial Diagnosis    Carcinoma, lung ([facility])      06/05/2017 -  Chemotherapy    The patient had palonosetron (ALOXI) injection 0.25 mg, 0.25 mg, Intravenous,  Once, 5 of 6 cycles Administration: 0.25 mg (06/11/2017), 0.25 mg (06/18/2017), 0.25 mg (06/25/2017), 0.25 mg (07/02/2017) CARBOplatin (PARAPLATIN) 190 mg in sodium chloride 0.9 % 250 mL chemo infusion, 190 mg (100 % of original dose 192.8 mg), Intravenous,  Once, 5 of 6 cycles Dose modification:   (original dose 192.8 mg, Cycle 1),   (original dose 192.8 mg, Cycle 5),   (original dose 192.8 mg, Cycle 2),   (original dose 192.8 mg, Cycle 3),   (original dose 192.8 mg, Cycle 4) Administration: 190 mg (06/11/2017), 190 mg (06/18/2017), 190 mg (06/25/2017), 190 mg (07/02/2017) PACLitaxel (TAXOL) 90 mg in dextrose 5 % 250 mL chemo infusion (</= 37m/m2), 45 mg/m2 = 90 mg, Intravenous,  Once, 5 of 6 cycles Dose modification: 40.5 mg/m2 (90 % of original dose 45 mg/m2, Cycle 5, Reason: Other (see comments), Comment: early neuropathy) Administration: 90 mg (06/11/2017), 90 mg (06/18/2017), 78 mg (06/25/2017), 78 mg (07/02/2017)  for chemotherapy treatment.         CANCER STAGING: Cancer Staging No matching staging information was found for the patient.   INTERVAL HISTORY:  Mr. [age]Kessner89y.o. male returns for routine follow-up and consideration for next cycle of chemotherapy.   He is seen prior to cycle 5 of weekly chemotherapy.  He denied any nausea, vomiting, diarrhea or constipation.  He has mild fatigue which is stable.  He is continuing daily radiation therapies.  He has 12 more treatments  left.  He continues to have some heaviness in his feet.  Denies any outright numbness.  Appetite has been good.  He lost about 1 pound.  No fevers or infections.  No ER visits.  No new pains.    REVIEW OF SYSTEMS:  Review of Systems  Constitutional: Positive for fatigue.  Neurological: Positive for numbness.  All other systems reviewed and are negative.    PAST MEDICAL/SURGICAL HISTORY:  Past Medical History:  Diagnosis Date  . Anxiety   . Arthritis   . Cirrhosis ([facility]    confirmed by MRI on 07/04/11.  no immunizations  . COPD (chronic obstructive pulmonary disease) ([facility]   . Depression with anxiety   . ETOH abuse   . Hyperlipidemia   . Nodule of upper lobe of right lung    with mediastinal adenopathy  . Wears dentures   . Wears glasses    Past Surgical History:  Procedure Laterality Date  . bilateral cataract surgery     [place]  . broken arm  6 yrs ago   left otif of wrist-[facility]  . CLOSED REDUCTION WRIST FRACTURE Right 09/02/2015   Procedure: RIGHT WRIST REDUCTION;  Surgeon: TRenette Butters MD;  [facility]  Service: Orthopedics;  Laterality: Right;  with MAC  . COLONOSCOPY  1996   [facility]: external hemorrhoids, no polyps  . COLONOSCOPY  06/28/2011   Dr. RAla Bentdiverticulosis,tubular adenoma, hyperplastic polyp  . COLONOSCOPY WITH PROPOFOL N/A 04/26/2017   Dr. RGala Romney perianal and  digital rectal examinations normal, diffusely congested colonic mucosa but otherwise normal  . ESOPHAGOGASTRODUODENOSCOPY  06/28/2011   Dr. Chelsea Aus erosive reflux esophagitis, hiatal hernia-gastritis  . ESOPHAGOGASTRODUODENOSCOPY (EGD) WITH PROPOFOL N/A 04/26/2017   Dr. Gala Romney: normal esophagus, small hiatal hernia, GAVE, portal hypertensive gastropathy, normal duodenal bulb and second portion, no specimens collected. 2 years screening   . EXTERNAL EAR SURGERY     right ear-cleaned out ear and created new eardrum  . INGUINAL HERNIA REPAIR  2-3 yrs ago   left-[facility]_  .  INTRAMEDULLARY (IM) NAIL INTERTROCHANTERIC Right 09/02/2015   Procedure: RIGHT  INTERTROCHANTRIC HIP;  Surgeon: Renette Butters, MD;  Location: [facility];  Service: Orthopedics;  Laterality: Right;  With MAC  . KNEE SURGERY     left-arthroscopy-[facility]  . PORTACATH PLACEMENT Left 06/08/2017   Procedure: INSERTION POWER PORT WITH  ATTACHED CATHETER LEFT SUBCLAVIAN;  Surgeon: Aviva Signs, MD;  [facility]: [facility];  Service: General;  Laterality: Left;  . SKULL FRACTURE ELEVATION     fractured cheeck bone repaired  . TOTAL HIP ARTHROPLASTY  12/18/2011   Procedure: TOTAL HIP ARTHROPLASTY;  Surgeon: Carole Civil, MD;  Location: [facility];  Service: Orthopedics;  Laterality: Left;  Marland Kitchen VIDEO BRONCHOSCOPY WITH ENDOBRONCHIAL ULTRASOUND N/A 05/04/2017   Procedure: VIDEO BRONCHOSCOPY WITH ENDOBRONCHIAL ULTRASOUND;  Surgeon: Melrose Nakayama, MD;  Location: [facility];  Service: Thoracic;  Laterality: N/A;  . WISDOM TOOTH EXTRACTION       SOCIAL HISTORY:  Social History   Socioeconomic History  . Marital status: Married    Spouse name: Not on file  . Number of children: Not on file  . Years of education: Not on file  . Highest education level: Not on file  Occupational History  . Occupation: retired    Fish farm manager: [employer]  Social Needs  . Financial resource strain: Not on file  . Food insecurity:    Worry: Not on file    Inability: Not on file  . Transportation needs:    Medical: Not on file    Non-medical: Not on file  Tobacco Use  . Smoking status: Current Every Day Smoker    Packs/day: 0.50    Years: 62.00    Pack years: 31.00    Types: Cigarettes  . Smokeless tobacco: Never Used  . Tobacco comment: 1/2 ppd > 60 years as of 06/27/17: 7 cigarettes or less a day  Substance and Sexual Activity  . Alcohol use: Not Currently    Comment: history of ETOH abuse, stopped 1.5 years ago (had one drink at the [employer]).   . Drug use: No  . Sexual activity: Yes    Birth  control/protection: None  Lifestyle  . Physical activity:    Days per week: Not on file    Minutes per session: Not on file  . Stress: Not on file  Relationships  . Social connections:    Talks on phone: Not on file    Gets together: Not on file    Attends religious service: Not on file    Active member of club or organization: Not on file    Attends meetings of clubs or organizations: Not on file    Relationship status: Not on file  . Intimate partner violence:    Fear of current or ex partner: Not on file    Emotionally abused: Not on file    Physically abused: Not on file    Forced sexual activity: Not on file  Other  Topics Concern  . Not on file  Social History Narrative  . Not on file    FAMILY HISTORY:  Family History  Problem Relation Age of Onset  . Stroke Mother 74  . Heart disease Father 17       MI  . Diabetes Maternal Uncle   . Colon cancer Neg Hx   . Cancer Neg Hx     CURRENT MEDICATIONS:  Outpatient Encounter Medications as of 07/09/2017  Medication Sig Note  . CARBOPLATIN IV Inject into the vein. Weekly during radiation 05/31/2017: Not started therapy course yet   . cholecalciferol (VITAMIN D) 1000 units tablet Take 1,000 Units by mouth daily.   Marland Kitchen lidocaine-prilocaine (EMLA) cream Apply a quarter size amount to affected area 1 hour prior to coming to chemotherapy.  Do not rub in.  Cover with plastic wrap. 05/31/2017: Not started therapy course yet  . Nutritional Supplements (ENSURE ENLIVE PO) Take 237 mLs by mouth. 2-3/day   . ondansetron (ZOFRAN) 8 MG tablet Take 1 tablet (8 mg total) by mouth every 8 (eight) hours as needed for nausea or vomiting. 05/31/2017: Not started therapy course yet   . PACLitaxel (TAXOL IV) Inject into the vein. Weekly during radiation 05/31/2017: Not started therapy course yet   . PROAIR HFA 108 (90 Base) MCG/ACT inhaler Inhale 2 puffs into the lungs every 6 (six) hours as needed (for shortness of breath/wheezing/cough).    . Propylene  Glycol-Glycerin (SOOTHE) 0.6-0.6 % SOLN Place 1-2 drops into both eyes 3 (three) times daily as needed (for dry/irritated eyes.).   Marland Kitchen Skin Protectants, Misc. (EUCERIN) cream Apply 1 application topically 4 (four) times daily as needed for dry skin (for itchy or dry skin).   . Tiotropium Bromide Monohydrate (SPIRIVA RESPIMAT) 2.5 MCG/ACT AERS Inhale 2 puffs into the lungs every evening.     No facility-administered encounter medications on file as of 07/09/2017.     ALLERGIES:  No Known Allergies   PHYSICAL EXAM:  ECOG Performance status: 1  I have reviewed his vitals. Physical Exam   LABORATORY DATA:  I have reviewed the labs as listed.  CBC    Component Value Date/Time   WBC 3.0 (L) 07/09/2017 0947   RBC 3.79 (L) 07/09/2017 0947   HGB 12.0 (L) 07/09/2017 0947   HCT 36.8 (L) 07/09/2017 0947   PLT 107 (L) 07/09/2017 0947   MCV 97.1 07/09/2017 0947   MCH 31.7 07/09/2017 0947   MCHC 32.6 07/09/2017 0947   RDW 15.8 (H) 07/09/2017 0947   LYMPHSABS 0.3 (L) 07/09/2017 0947   MONOABS 0.2 07/09/2017 0947   EOSABS 0.0 07/09/2017 0947   BASOSABS 0.0 07/09/2017 0947   CMP Latest Ref Rng & Units 07/09/2017 07/02/2017 06/25/2017  Glucose 65 - 99 mg/dL 96 94 94  BUN 6 - 20 mg/dL _0 Creatinine 0.61 - 1.24 mg/dL 0.79 0.79 0.89  Sodium 135 - 145 mmol/L 139 136 138  Potassium 3.5 - 5.1 mmol/L 4.5 4.5 4.7  Chloride 101 - 111 mmol/L 100(L) 99(L) 99(L)  CO2 22 - 32 mmol/L _1 Calcium 8.9 - 10.3 mg/dL 9.1 9.0 9.2  Total Protein 6.5 - 8.1 g/dL 7.0 7.3 7.2  Total Bilirubin 0.3 - 1.2 mg/dL 0.9 0.8 0.9  Alkaline Phos 38 - 126 U/L 56 61 58  AST 15 - 41 U/L _2 ALT 17 - 63 U/L _3 ASSESSMENT & PLAN:  Carcinoma, lung ([place]) 1.  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 -Started on chemo radiation therapy on 06/11/2017 with weekly carboplatin and paclitaxel. -He has tolerated week 4 of therapy very well.  His only complaint is mild fatigue.  His appetite has been great.  He will proceed with week 5 without any dose modifications.  He will come back in 1 week with labs and possible treatment.  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  We will plan to repeat CT scans 4 weeks after completion of chemoradiation therapy.  We plan to initiate Durvalumab within 6 weeks of completion of combination therapy.  2.  Neuropathy: I have cut back on paclitaxel dose by 10% during week 3.  His heaviness in the legs have improved.  He has occasional numbness in the feet when he lies down a long time.      Orders placed this encounter:  Orders Placed This Encounter  Procedures  . CBC with Differential  . Comprehensive metabolic panel      Derek Jack, MD [place] [phone]

## 2017-07-11 DIAGNOSIS — C3411 Malignant neoplasm of upper lobe, right bronchus or lung: Secondary | ICD-10-CM | POA: Diagnosis not present

## 2017-07-12 ENCOUNTER — Ambulatory Visit (HOSPITAL_COMMUNITY): Payer: PPO | Admitting: Hematology

## 2017-07-12 ENCOUNTER — Ambulatory Visit (HOSPITAL_COMMUNITY): Payer: PPO

## 2017-07-16 ENCOUNTER — Inpatient Hospital Stay (HOSPITAL_COMMUNITY): Payer: PPO

## 2017-07-16 ENCOUNTER — Inpatient Hospital Stay (HOSPITAL_BASED_OUTPATIENT_CLINIC_OR_DEPARTMENT_OTHER): Payer: PPO | Admitting: Hematology

## 2017-07-16 ENCOUNTER — Other Ambulatory Visit: Payer: Self-pay

## 2017-07-16 ENCOUNTER — Encounter (HOSPITAL_COMMUNITY): Payer: Self-pay | Admitting: Hematology

## 2017-07-16 VITALS — BP 154/65 | HR 81 | Resp 18 | Wt 177.0 lb

## 2017-07-16 VITALS — BP 144/55 | HR 75 | Temp 97.9°F | Resp 16

## 2017-07-16 DIAGNOSIS — T451X5A Adverse effect of antineoplastic and immunosuppressive drugs, initial encounter: Secondary | ICD-10-CM

## 2017-07-16 DIAGNOSIS — Z5111 Encounter for antineoplastic chemotherapy: Secondary | ICD-10-CM | POA: Diagnosis not present

## 2017-07-16 DIAGNOSIS — C3491 Malignant neoplasm of unspecified part of right bronchus or lung: Secondary | ICD-10-CM

## 2017-07-16 DIAGNOSIS — C3411 Malignant neoplasm of upper lobe, right bronchus or lung: Secondary | ICD-10-CM | POA: Diagnosis not present

## 2017-07-16 DIAGNOSIS — C3490 Malignant neoplasm of unspecified part of unspecified bronchus or lung: Secondary | ICD-10-CM

## 2017-07-16 DIAGNOSIS — G620 Drug-induced polyneuropathy: Secondary | ICD-10-CM

## 2017-07-16 LAB — CBC WITH DIFFERENTIAL/PLATELET
Basophils Absolute: 0 10*3/uL (ref 0.0–0.1)
Basophils Relative: 0 %
Eosinophils Absolute: 0 10*3/uL (ref 0.0–0.7)
Eosinophils Relative: 0 %
HCT: 34.8 % — ABNORMAL LOW (ref 39.0–52.0)
Hemoglobin: 11.5 g/dL — ABNORMAL LOW (ref 13.0–17.0)
Lymphocytes Relative: 14 %
Lymphs Abs: 0.4 10*3/uL — ABNORMAL LOW (ref 0.7–4.0)
MCH: 31.9 pg (ref 26.0–34.0)
MCHC: 33 g/dL (ref 30.0–36.0)
MCV: 96.7 fL (ref 78.0–100.0)
Monocytes Absolute: 0.2 10*3/uL (ref 0.1–1.0)
Monocytes Relative: 9 %
Neutro Abs: 2.2 10*3/uL (ref 1.7–7.7)
Neutrophils Relative %: 77 %
Platelets: 95 10*3/uL — ABNORMAL LOW (ref 150–400)
RBC: 3.6 MIL/uL — ABNORMAL LOW (ref 4.22–5.81)
RDW: 16.4 % — ABNORMAL HIGH (ref 11.5–15.5)
WBC: 2.8 10*3/uL — ABNORMAL LOW (ref 4.0–10.5)

## 2017-07-16 LAB — COMPREHENSIVE METABOLIC PANEL
ALT: 22 U/L (ref 17–63)
AST: 28 U/L (ref 15–41)
Albumin: 4.1 g/dL (ref 3.5–5.0)
Alkaline Phosphatase: 55 U/L (ref 38–126)
Anion gap: 7 (ref 5–15)
BUN: 19 mg/dL (ref 6–20)
CO2: 30 mmol/L (ref 22–32)
Calcium: 9.1 mg/dL (ref 8.9–10.3)
Chloride: 101 mmol/L (ref 101–111)
Creatinine, Ser: 0.84 mg/dL (ref 0.61–1.24)
GFR calc Af Amer: >60 mL/min (ref >60)
GFR calc non Af Amer: >60 mL/min (ref >60)
Glucose, Bld: 94 mg/dL (ref 65–99)
Potassium: 4.3 mmol/L (ref 3.5–5.1)
Sodium: 138 mmol/L (ref 135–145)
Total Bilirubin: 1.1 mg/dL (ref 0.3–1.2)
Total Protein: 7 g/dL (ref 6.5–8.1)

## 2017-07-16 MED ORDER — FAMOTIDINE IN NACL 20-0.9 MG/50ML-% IV SOLN
20.0000 mg | Freq: Once | INTRAVENOUS | Status: AC
  Administered 2017-07-16: 20 mg via INTRAVENOUS
  Filled 2017-07-16: qty 50

## 2017-07-16 MED ORDER — SODIUM CHLORIDE 0.9 % IV SOLN
144.6000 mg | Freq: Once | INTRAVENOUS | Status: AC
  Administered 2017-07-16: 140 mg via INTRAVENOUS
  Filled 2017-07-16: qty 14

## 2017-07-16 MED ORDER — SODIUM CHLORIDE 0.9 % IV SOLN
36.0000 mg/m2 | Freq: Once | INTRAVENOUS | Status: AC
  Administered 2017-07-16: 72 mg via INTRAVENOUS
  Filled 2017-07-16: qty 12

## 2017-07-16 MED ORDER — SODIUM CHLORIDE 0.9 % IV SOLN
20.0000 mg | Freq: Once | INTRAVENOUS | Status: AC
  Administered 2017-07-16: 20 mg via INTRAVENOUS
  Filled 2017-07-16: qty 2

## 2017-07-16 MED ORDER — DIPHENHYDRAMINE HCL 50 MG/ML IJ SOLN
50.0000 mg | Freq: Once | INTRAMUSCULAR | Status: AC
  Administered 2017-07-16: 50 mg via INTRAVENOUS
  Filled 2017-07-16: qty 1

## 2017-07-16 MED ORDER — PALONOSETRON HCL INJECTION 0.25 MG/5ML
0.2500 mg | Freq: Once | INTRAVENOUS | Status: AC
  Administered 2017-07-16: 0.25 mg via INTRAVENOUS
  Filled 2017-07-16: qty 5

## 2017-07-16 MED ORDER — HEPARIN SOD (PORK) LOCK FLUSH 100 UNIT/ML IV SOLN
500.0000 [IU] | Freq: Once | INTRAVENOUS | Status: AC | PRN
  Administered 2017-07-16: 500 [IU]
  Filled 2017-07-16: qty 5

## 2017-07-16 MED ORDER — SODIUM CHLORIDE 0.9 % IV SOLN
Freq: Once | INTRAVENOUS | Status: AC
  Administered 2017-07-16: 12:00:00 via INTRAVENOUS

## 2017-07-16 NOTE — Assessment & Plan Note (Signed)
1.  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 -Started on chemo radiation therapy on 06/11/2017 with weekly carboplatin and paclitaxel. -He has tolerated week 5 of chemotherapy very well last week.  He has thrombocytopenia with platelet count around 80-90,000.  I will slightly dose reduce his chemotherapy today.  This will be his last weekly dose of chemo.  He will finish radiation therapy next Wednesday.  I plan to repeat CT scan in 5 weeks from today.  We will see him back after the scan and discuss the results.  If he gets a partial response or complete response, he will be a candidate for Durvalumab maintenance for a year.  2.  Neuropathy: I have cut back on paclitaxel dose by 10% during week 3.  His heaviness in the legs have improved.  He has occasional numbness in the feet when he lies down a long time.

## 2017-07-16 NOTE — Progress Notes (Signed)
[place], [place] [address]   CLINIC:  Medical Oncology/Hematology  PCP:  Sharilyn Sites, [place] [address] [phone]   REASON FOR VISIT:  Follow-up for lung cancer.  CURRENT THERAPY: Chemoradiation therapy.  BRIEF ONCOLOGIC HISTORY:    Carcinoma, lung ([facility])   06/05/2017 Initial Diagnosis    Carcinoma, lung ([facility])      06/05/2017 -  Chemotherapy    The patient had palonosetron (ALOXI) injection 0.25 mg, 0.25 mg, Intravenous,  Once, 6 of 6 cycles Administration: 0.25 mg (06/11/2017), 0.25 mg (07/09/2017), 0.25 mg (07/16/2017), 0.25 mg (06/18/2017), 0.25 mg (06/25/2017), 0.25 mg (07/02/2017) CARBOplatin (PARAPLATIN) 190 mg in sodium chloride 0.9 % 250 mL chemo infusion, 190 mg (100 % of original dose 192.8 mg), Intravenous,  Once, 6 of 6 cycles Dose modification:   (original dose 192.8 mg, Cycle 1),   (original dose 192.8 mg, Cycle 5), 144.6 mg (original dose 144.6 mg, Cycle 6),   (original dose 192.8 mg, Cycle 2),   (original dose 192.8 mg, Cycle 3),   (original dose 192.8 mg, Cycle 4) Administration: 190 mg (06/11/2017), 190 mg (07/09/2017), 140 mg (07/16/2017), 190 mg (06/18/2017), 190 mg (06/25/2017), 190 mg (07/02/2017) PACLitaxel (TAXOL) 90 mg in dextrose 5 % 250 mL chemo infusion (</= 64m/m2), 45 mg/m2 = 90 mg, Intravenous,  Once, 6 of 6 cycles Dose modification: 40.5 mg/m2 (90 % of original dose 45 mg/m2, Cycle 5, Reason: Other (see comments), Comment: early neuropathy), 36 mg/m2 (80 % of original dose 45 mg/m2, Cycle 6, Reason: Provider Judgment) Administration: 90 mg (06/11/2017), 78 mg (07/09/2017), 72 mg (07/16/2017), 90 mg (06/18/2017), 78 mg (06/25/2017), 78 mg (07/02/2017)  for chemotherapy treatment.         INTERVAL HISTORY:  Mr. [age]Burkhalter89y.o. male returns for routine follow-up and consideration for next cycle of chemotherapy.   He completed 5 weeks of chemotherapy.  He will have radiation until next Wednesday.   He denied any major problems after his last chemotherapy.  He does have occasional nausea and took Compazine which helped.  Denied any vomiting or diarrhea.  His numbness in the feet has been stable.  His energy levels are about 75%.  Appetite has been great.  No fevers or infections noted.  REVIEW OF SYSTEMS:  Review of Systems  Constitutional: Positive for fatigue.  Gastrointestinal: Positive for nausea.  Neurological: Positive for numbness.  All other systems reviewed and are negative.    PAST MEDICAL/SURGICAL HISTORY:  Past Medical History:  Diagnosis Date  . Anxiety   . Arthritis   . Cirrhosis ([facility]    confirmed by MRI on 07/04/11.  no immunizations  . COPD (chronic obstructive pulmonary disease) ([facility]   . Depression with anxiety   . ETOH abuse   . Hyperlipidemia   . Nodule of upper lobe of right lung    with mediastinal adenopathy  . Wears dentures   . Wears glasses    Past Surgical History:  Procedure Laterality Date  . bilateral cataract surgery     [place]  . broken arm  6 yrs ago   left otif of wrist-[facility]  . CLOSED REDUCTION WRIST FRACTURE Right 09/02/2015   Procedure: RIGHT WRIST REDUCTION;  Surgeon: TRenette Butters MD;  [facility]  Service: Orthopedics;  Laterality: Right;  with MAC  . COLONOSCOPY  1996   [facility]: external hemorrhoids, no polyps  . COLONOSCOPY  06/28/2011   Dr. RAla Bentdiverticulosis,tubular adenoma, hyperplastic  polyp  . COLONOSCOPY WITH PROPOFOL N/A 04/26/2017   Dr. Gala Romney: perianal and digital rectal examinations normal, diffusely congested colonic mucosa but otherwise normal  . ESOPHAGOGASTRODUODENOSCOPY  06/28/2011   Dr. Chelsea Aus erosive reflux esophagitis, hiatal hernia-gastritis  . ESOPHAGOGASTRODUODENOSCOPY (EGD) WITH PROPOFOL N/A 04/26/2017   Dr. Gala Romney: normal esophagus, small hiatal hernia, GAVE, portal hypertensive gastropathy, normal duodenal bulb and second portion, no specimens collected. 2 years screening   . EXTERNAL  EAR SURGERY     right ear-cleaned out ear and created new eardrum  . INGUINAL HERNIA REPAIR  2-3 yrs ago   left-[facility]  . INTRAMEDULLARY (IM) NAIL INTERTROCHANTERIC Right 09/02/2015   Procedure: RIGHT  INTERTROCHANTRIC HIP;  Surgeon: Renette Butters, MD;  Location: [facility];  Service: Orthopedics;  Laterality: Right;  With MAC  . KNEE SURGERY     left-arthroscopy-[facility]  . PORTACATH PLACEMENT Left 06/08/2017   Procedure: INSERTION POWER PORT WITH  ATTACHED CATHETER LEFT SUBCLAVIAN;  Surgeon: Aviva Signs, MD;  [facility];  Service: General;  Laterality: Left;  . SKULL FRACTURE ELEVATION     fractured cheeck bone repaired  . TOTAL HIP ARTHROPLASTY  12/18/2011   Procedure: TOTAL HIP ARTHROPLASTY;  Surgeon: Carole Civil, MD;  [facility];  Service: Orthopedics;  Laterality: Left;  Marland Kitchen VIDEO BRONCHOSCOPY WITH ENDOBRONCHIAL ULTRASOUND N/A 05/04/2017   Procedure: VIDEO BRONCHOSCOPY WITH ENDOBRONCHIAL ULTRASOUND;  Surgeon: Melrose Nakayama, MD;  Location: [facility];  Service: Thoracic;  Laterality: N/A;  . WISDOM TOOTH EXTRACTION       SOCIAL HISTORY:  Social History   Socioeconomic History  . Marital status: Married    Spouse name: Not on file  . Number of children: Not on file  . Years of education: Not on file  . Highest education level: Not on file  Occupational History  . Occupation: retired    Fish farm manager: [employer]  Social Needs  . Financial resource strain: Not on file  . Food insecurity:    Worry: Not on file    Inability: Not on file  . Transportation needs:    Medical: Not on file    Non-medical: Not on file  Tobacco Use  . Smoking status: Current Every Day Smoker    Packs/day: 0.50    Years: 62.00    Pack years: 31.00    Types: Cigarettes  . Smokeless tobacco: Never Used  . Tobacco comment: 1/2 ppd > 60 years as of 06/27/17: 7 cigarettes or less a day  Substance and Sexual Activity  . Alcohol use: Not Currently    Comment: history of  ETOH abuse, stopped 1.5 years ago (had one drink at the [employer]).   . Drug use: No  . Sexual activity: Yes    Birth control/protection: None  Lifestyle  . Physical activity:    Days per week: Not on file    Minutes per session: Not on file  . Stress: Not on file  Relationships  . Social connections:    Talks on phone: Not on file    Gets together: Not on file    Attends religious service: Not on file    Active member of club or organization: Not on file    Attends meetings of clubs or organizations: Not on file    Relationship status: Not on file  . Intimate partner violence:    Fear of current or ex partner: Not on file    Emotionally abused: Not on file    Physically abused:  Not on file    Forced sexual activity: Not on file  Other Topics Concern  . Not on file  Social History Narrative  . Not on file    FAMILY HISTORY:  Family History  Problem Relation Age of Onset  . Stroke Mother 76  . Heart disease Father 81       MI  . Diabetes Maternal Uncle   . Colon cancer Neg Hx   . Cancer Neg Hx     CURRENT MEDICATIONS:  Outpatient Encounter Medications as of 07/16/2017  Medication Sig Note  . CARBOPLATIN IV Inject into the vein. Weekly during radiation 05/31/2017: Not started therapy course yet   . cholecalciferol (VITAMIN D) 1000 units tablet Take 1,000 Units by mouth daily.   Marland Kitchen lidocaine-prilocaine (EMLA) cream Apply a quarter size amount to affected area 1 hour prior to coming to chemotherapy.  Do not rub in.  Cover with plastic wrap. 05/31/2017: Not started therapy course yet  . Nutritional Supplements (ENSURE ENLIVE PO) Take 237 mLs by mouth. 2-3/day   . ondansetron (ZOFRAN) 8 MG tablet Take 1 tablet (8 mg total) by mouth every 8 (eight) hours as needed for nausea or vomiting. 05/31/2017: Not started therapy course yet   . PACLitaxel (TAXOL IV) Inject into the vein. Weekly during radiation 05/31/2017: Not started therapy course yet   . PROAIR HFA 108 (90 Base) MCG/ACT  inhaler Inhale 2 puffs into the lungs every 6 (six) hours as needed (for shortness of breath/wheezing/cough).    . Propylene Glycol-Glycerin (SOOTHE) 0.6-0.6 % SOLN Place 1-2 drops into both eyes 3 (three) times daily as needed (for dry/irritated eyes.).   Marland Kitchen Skin Protectants, Misc. (EUCERIN) cream Apply 1 application topically 4 (four) times daily as needed for dry skin (for itchy or dry skin).   . Tiotropium Bromide Monohydrate (SPIRIVA RESPIMAT) 2.5 MCG/ACT AERS Inhale 2 puffs into the lungs every evening.     No facility-administered encounter medications on file as of 07/16/2017.     ALLERGIES:  No Known Allergies   PHYSICAL EXAM:  ECOG Performance status: 1  Vitals:   07/16/17 1040  BP: (!) 154/65  Pulse: 81  Resp: 18  SpO2: 98%   Filed Weights   07/16/17 1040  Weight: 177 lb (80.3 kg)    Physical Exam Deferred.  LABORATORY DATA:  I have reviewed the labs as listed.  CBC    Component Value Date/Time   WBC 2.8 (L) 07/16/2017 1012   RBC 3.60 (L) 07/16/2017 1012   HGB 11.5 (L) 07/16/2017 1012   HCT 34.8 (L) 07/16/2017 1012   PLT 95 (L) 07/16/2017 1012   MCV 96.7 07/16/2017 1012   MCH 31.9 07/16/2017 1012   MCHC 33.0 07/16/2017 1012   RDW 16.4 (H) 07/16/2017 1012   LYMPHSABS 0.4 (L) 07/16/2017 1012   MONOABS 0.2 07/16/2017 1012   EOSABS 0.0 07/16/2017 1012   BASOSABS 0.0 07/16/2017 1012   CMP Latest Ref Rng & Units 07/16/2017 07/09/2017 07/02/2017  Glucose 65 - 99 mg/dL 94 96 94  BUN 6 - 20 mg/dL 19 20 16   Creatinine 0.61 - 1.24 mg/dL 0.84 0.79 0.79  Sodium 135 - 145 mmol/L 138 139 136  Potassium 3.5 - 5.1 mmol/L 4.3 4.5 4.5  Chloride 101 - 111 mmol/L 101 100(L) 99(L)  CO2 22 - 32 mmol/L 30 31 31   Calcium 8.9 - 10.3 mg/dL 9.1 9.1 9.0  Total Protein 6.5 - 8.1 g/dL 7.0 7.0 7.3  Total Bilirubin 0.3 - 1.2  mg/dL 1.1 0.9 0.8  Alkaline Phos 38 - 126 U/L 55 56 61  AST 15 - 41 U/L 28 25 29   ALT 17 - 63 U/L 22 19 19         ASSESSMENT & PLAN:   Malignant  neoplasm of bronchus and lung ([facility]) 1.  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 -Started on chemo radiation therapy on 06/11/2017 with weekly carboplatin and paclitaxel. -He has tolerated week 5 of chemotherapy very well last week.  He has thrombocytopenia with platelet count around 80-90,000.  I will slightly dose reduce his chemotherapy today.  This will be his last weekly dose of chemo.  He will finish radiation therapy next Wednesday.  I plan to repeat CT scan in 5 weeks from today.  We will see him back after the scan and discuss the results.  If he gets a partial response or complete response, he will be a candidate for Durvalumab maintenance for a year.  2.  Neuropathy: I have cut back on paclitaxel dose by 10% during week 3.  His heaviness in the legs have improved.  He has occasional numbness in the feet when he lies down a long time.      Orders placed this encounter:  Orders Placed This Encounter  Procedures  . CT Chest W Contrast  . Comprehensive metabolic panel  . CBC with Differential  . CBC with Differential  . Comprehensive metabolic panel      Derek Jack, MD [place] [phone]

## 2017-07-16 NOTE — Progress Notes (Signed)
Dr. Delton Coombes aware of lab results and platelet count of 95,000.  Okay to proceed with tx today per MD.   Tolerated infusions w/o adverse reaction.  Alert, in no distress.  VSS.  Discharged via wheelchair in c/o spouse.

## 2017-07-18 DIAGNOSIS — C3411 Malignant neoplasm of upper lobe, right bronchus or lung: Secondary | ICD-10-CM | POA: Diagnosis not present

## 2017-08-20 ENCOUNTER — Other Ambulatory Visit (HOSPITAL_COMMUNITY): Payer: PPO

## 2017-08-20 ENCOUNTER — Ambulatory Visit (HOSPITAL_COMMUNITY): Payer: PPO

## 2017-08-23 ENCOUNTER — Ambulatory Visit (HOSPITAL_COMMUNITY): Payer: PPO | Admitting: Hematology

## 2017-08-24 DIAGNOSIS — C3490 Malignant neoplasm of unspecified part of unspecified bronchus or lung: Secondary | ICD-10-CM | POA: Diagnosis not present

## 2017-08-24 DIAGNOSIS — C3411 Malignant neoplasm of upper lobe, right bronchus or lung: Secondary | ICD-10-CM | POA: Diagnosis not present

## 2017-09-07 ENCOUNTER — Inpatient Hospital Stay (HOSPITAL_COMMUNITY): Payer: PPO | Attending: Hematology

## 2017-09-07 ENCOUNTER — Ambulatory Visit (HOSPITAL_COMMUNITY)
Admission: RE | Admit: 2017-09-07 | Discharge: 2017-09-07 | Disposition: A | Discharge status: Home / Self Care | Payer: PPO | Source: Ambulatory Visit | Attending: Hematology | Admitting: Hematology

## 2017-09-07 DIAGNOSIS — R911 Solitary pulmonary nodule: Secondary | ICD-10-CM | POA: Insufficient documentation

## 2017-09-07 DIAGNOSIS — I700 Atherosclerosis of aorta: Secondary | ICD-10-CM | POA: Insufficient documentation

## 2017-09-07 DIAGNOSIS — K7460 Unspecified cirrhosis of liver: Secondary | ICD-10-CM | POA: Diagnosis not present

## 2017-09-07 DIAGNOSIS — C3411 Malignant neoplasm of upper lobe, right bronchus or lung: Secondary | ICD-10-CM | POA: Insufficient documentation

## 2017-09-07 DIAGNOSIS — Z79899 Other long term (current) drug therapy: Secondary | ICD-10-CM | POA: Diagnosis not present

## 2017-09-07 DIAGNOSIS — T451X5A Adverse effect of antineoplastic and immunosuppressive drugs, initial encounter: Secondary | ICD-10-CM | POA: Insufficient documentation

## 2017-09-07 DIAGNOSIS — C3490 Malignant neoplasm of unspecified part of unspecified bronchus or lung: Secondary | ICD-10-CM | POA: Diagnosis not present

## 2017-09-07 DIAGNOSIS — G620 Drug-induced polyneuropathy: Secondary | ICD-10-CM | POA: Insufficient documentation

## 2017-09-07 DIAGNOSIS — C3491 Malignant neoplasm of unspecified part of right bronchus or lung: Secondary | ICD-10-CM | POA: Insufficient documentation

## 2017-09-07 DIAGNOSIS — F17210 Nicotine dependence, cigarettes, uncomplicated: Secondary | ICD-10-CM | POA: Diagnosis not present

## 2017-09-07 DIAGNOSIS — J439 Emphysema, unspecified: Secondary | ICD-10-CM | POA: Insufficient documentation

## 2017-09-07 LAB — CBC WITH DIFFERENTIAL/PLATELET
Basophils Absolute: 0 10*3/uL (ref 0.0–0.1)
Basophils Relative: 0 %
Eosinophils Absolute: 0.1 10*3/uL (ref 0.0–0.7)
Eosinophils Relative: 3 %
HCT: 42.6 % (ref 39.0–52.0)
Hemoglobin: 14.1 g/dL (ref 13.0–17.0)
Lymphocytes Relative: 13 %
Lymphs Abs: 0.6 10*3/uL — ABNORMAL LOW (ref 0.7–4.0)
MCH: 33.6 pg (ref 26.0–34.0)
MCHC: 33.1 g/dL (ref 30.0–36.0)
MCV: 101.4 fL — ABNORMAL HIGH (ref 78.0–100.0)
Monocytes Absolute: 0.5 10*3/uL (ref 0.1–1.0)
Monocytes Relative: 11 %
Neutro Abs: 3.4 10*3/uL (ref 1.7–7.7)
Neutrophils Relative %: 73 %
Platelets: 151 10*3/uL (ref 150–400)
RBC: 4.2 MIL/uL — ABNORMAL LOW (ref 4.22–5.81)
RDW: 15.7 % — ABNORMAL HIGH (ref 11.5–15.5)
WBC: 4.6 10*3/uL (ref 4.0–10.5)

## 2017-09-07 LAB — COMPREHENSIVE METABOLIC PANEL
ALT: 15 U/L (ref 0–44)
AST: 26 U/L (ref 15–41)
Albumin: 4.1 g/dL (ref 3.5–5.0)
Alkaline Phosphatase: 72 U/L (ref 38–126)
Anion gap: 8 (ref 5–15)
BUN: 18 mg/dL (ref 8–23)
CO2: 30 mmol/L (ref 22–32)
Calcium: 9.1 mg/dL (ref 8.9–10.3)
Chloride: 101 mmol/L (ref 98–111)
Creatinine, Ser: 0.92 mg/dL (ref 0.61–1.24)
GFR calc Af Amer: >60 mL/min (ref >60)
GFR calc non Af Amer: >60 mL/min (ref >60)
Glucose, Bld: 100 mg/dL — ABNORMAL HIGH (ref 70–99)
Potassium: 4 mmol/L (ref 3.5–5.1)
Sodium: 139 mmol/L (ref 135–145)
Total Bilirubin: 0.9 mg/dL (ref 0.3–1.2)
Total Protein: 7.5 g/dL (ref 6.5–8.1)

## 2017-09-07 MED ORDER — IOHEXOL 300 MG/ML  SOLN
75.0000 mL | Freq: Once | INTRAMUSCULAR | Status: AC | PRN
  Administered 2017-09-07: 75 mL via INTRAVENOUS

## 2017-09-18 ENCOUNTER — Encounter (HOSPITAL_COMMUNITY): Payer: Self-pay | Admitting: Hematology

## 2017-09-18 ENCOUNTER — Inpatient Hospital Stay (HOSPITAL_BASED_OUTPATIENT_CLINIC_OR_DEPARTMENT_OTHER): Payer: PPO | Admitting: Hematology

## 2017-09-18 VITALS — BP 167/70 | HR 67 | Temp 98.0°F | Resp 16 | Wt 177.8 lb

## 2017-09-18 DIAGNOSIS — G620 Drug-induced polyneuropathy: Secondary | ICD-10-CM

## 2017-09-18 DIAGNOSIS — T451X5A Adverse effect of antineoplastic and immunosuppressive drugs, initial encounter: Secondary | ICD-10-CM | POA: Diagnosis not present

## 2017-09-18 DIAGNOSIS — C3491 Malignant neoplasm of unspecified part of right bronchus or lung: Secondary | ICD-10-CM

## 2017-09-18 DIAGNOSIS — F17210 Nicotine dependence, cigarettes, uncomplicated: Secondary | ICD-10-CM

## 2017-09-18 DIAGNOSIS — C3411 Malignant neoplasm of upper lobe, right bronchus or lung: Secondary | ICD-10-CM

## 2017-09-18 DIAGNOSIS — C3490 Malignant neoplasm of unspecified part of unspecified bronchus or lung: Secondary | ICD-10-CM

## 2017-09-18 NOTE — Progress Notes (Signed)
[place], [place] [address]   CLINIC:  Medical Oncology/Hematology  PCP:  Sharilyn Sites, MD [address] 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  CURRENT THERAPY: starting imfinzi   BRIEF ONCOLOGIC HISTORY:    Carcinoma, lung ([place])   06/05/2017 Initial Diagnosis    Carcinoma, lung ([place])      06/05/2017 -  Chemotherapy    The patient had palonosetron (ALOXI) injection 0.25 mg, 0.25 mg, Intravenous,  Once, 6 of 6 cycles Administration: 0.25 mg (06/11/2017), 0.25 mg (07/09/2017), 0.25 mg (07/16/2017), 0.25 mg (06/18/2017), 0.25 mg (06/25/2017), 0.25 mg (07/02/2017) CARBOplatin (PARAPLATIN) 190 mg in sodium chloride 0.9 % 250 mL chemo infusion, 190 mg (100 % of original dose 192.8 mg), Intravenous,  Once, 6 of 6 cycles Dose modification:   (original dose 192.8 mg, Cycle 1),   (original dose 192.8 mg, Cycle 5), 144.6 mg (original dose 144.6 mg, Cycle 6),   (original dose 192.8 mg, Cycle 2),   (original dose 192.8 mg, Cycle 3),   (original dose 192.8 mg, Cycle 4) Administration: 190 mg (06/11/2017), 190 mg (07/09/2017), 140 mg (07/16/2017), 190 mg (06/18/2017), 190 mg (06/25/2017), 190 mg (07/02/2017) PACLitaxel (TAXOL) 90 mg in dextrose 5 % 250 mL chemo infusion (</= 80m/m2), 45 mg/m2 = 90 mg, Intravenous,  Once, 6 of 6 cycles Dose modification: 40.5 mg/m2 (90 % of original dose 45 mg/m2, Cycle 5, Reason: Other (see comments), Comment: early neuropathy), 36 mg/m2 (80 % of original dose 45 mg/m2, Cycle 6, Reason: Provider Judgment) Administration: 90 mg (06/11/2017), 78 mg (07/09/2017), 72 mg (07/16/2017), 90 mg (06/18/2017), 78 mg (06/25/2017), 78 mg (07/02/2017)  for chemotherapy treatment.        Malignant neoplasm of bronchus and lung ([facility]   06/08/2017 Initial Diagnosis    Malignant ne[facility] of [facility]us and lung ([facility]      09/19/2017 -  Chemotherapy    The patient had  durvalumab (IMFINZI) 800 mg in sodium chloride 0.9 % 100 mL chemo infusion, 10 mg/kg, Intravenous,  Once, 0 of 6 cycles  for chemotherapy treatment.          INTERVAL HISTORY:  Ian Aguilar returns for routine follow-up for adenocarcinoma of the lung. Patient is here today with his son. Patient is feeling good. No lasting side effects from chemo and radiation. Patient is still drinking ensure daily to maintain his weight. His appetite is back up to 100%. He performs all his own ADLs and is not limited with his activity. Denies any new pains. Denies SOB or chest pain. Denies nausea, vomiting, diarrhea.     REVIEW OF SYSTEMS:  Review of Systems  All other systems reviewed and are negative.    PAST MEDICAL/SURGICAL HISTORY:  Past Medical History:  Diagnosis Date  . Anxiety   . Arthritis   . Cirrhosis ([facility]    confirmed by MRI on 07/04/11.  no immunizations  . COPD (chronic obstructive pulmonary disease) ([facility]   . Depression with anxiety   . ETOH abuse   . Hyperlipidemia   . Nodule of upper lobe of right lung    with mediastinal adenopathy  . Wears dentures   . Wears glasses    Past Surgical History:  Procedure Laterality Date  . bilateral cataract surgery     [place]  . broken arm  6 yrs ago   left otif of wrist-[place]  .  CLOSED REDUCTION WRIST FRACTURE Right 09/02/2015   Procedure: RIGHT WRIST REDUCTION;  Surgeon: Renette Butters, MD;  Location: [facility];  Service: Orthopedics;  Laterality: Right;  with MAC  . COLONOSCOPY  1996   [place]: external hemorrhoids, no polyps  . COLONOSCOPY  06/28/2011   Dr. Ala Bent diverticulosis,tubular adenoma, hyperplastic polyp  . COLONOSCOPY WITH PROPOFOL N/A 04/26/2017   Dr. Gala Romney: perianal and digital rectal examinations normal, diffusely congested colonic mucosa but otherwise normal  . ESOPHAGOGASTRODUODENOSCOPY  06/28/2011   Dr. Chelsea Aus erosive reflux esophagitis, hiatal hernia-gastritis  . ESOPHAGOGASTRODUODENOSCOPY  (EGD) WITH PROPOFOL N/A 04/26/2017   Dr. Gala Romney: normal esophagus, small hiatal hernia, GAVE, portal hypertensive gastropathy, normal duodenal bulb and second portion, no specimens collected. 2 years screening   . EXTERNAL EAR SURGERY     right ear-cleaned out ear and created new eardrum  . INGUINAL HERNIA REPAIR  2-3 yrs ago   left-[facility]_  . INTRAMEDULLARY (IM) NAIL INTERTROCHANTERIC Right 09/02/2015   Procedure: RIGHT  INTERTROCHANTRIC HIP;  Surgeon: Renette Butters, MD;  Location: [facility];  Service: Orthopedics;  Laterality: Right;  With MAC  . KNEE SURGERY     left-arthroscopy-[place]  . PORTACATH PLACEMENT Left 06/08/2017   Procedure: INSERTION POWER PORT WITH  ATTACHED CATHETER LEFT SUBCLAVIAN;  Surgeon: Aviva Signs, MD;  Location: [facility];  Service: General;  Laterality: Left;  . SKULL FRACTURE ELEVATION     fractured cheeck bone repaired  . TOTAL HIP ARTHROPLASTY  12/18/2011   Procedure: TOTAL HIP ARTHROPLASTY;  Surgeon: Carole Civil, MD;  Location: [facility];  Service: Orthopedics;  Laterality: Left;  Marland Kitchen VIDEO BRONCHOSCOPY WITH ENDOBRONCHIAL ULTRASOUND N/A 05/04/2017   Procedure: VIDEO BRONCHOSCOPY WITH ENDOBRONCHIAL ULTRASOUND;  Surgeon: Melrose Nakayama, MD;  Location: [facility];  Service: Thoracic;  Laterality: N/A;  . WISDOM TOOTH EXTRACTION       SOCIAL HISTORY:  Social History   Socioeconomic History  . Marital status: Married    Spouse name: Not on file  . Number of children: Not on file  . Years of education: Not on file  . Highest education level: Not on file  Occupational History  . Occupation: retired    Fish farm manager: [employer]  Social Needs  . Financial resource strain: Not on file  . Food insecurity:    Worry: Not on file    Inability: Not on file  . Transportation needs:    Medical: Not on file    Non-medical: Not on file  Tobacco Use  . Smoking status: Current Every Day Smoker    Packs/day: 0.50    Years: 62.00    Pack years: 31.00      Types: Cigarettes  . Smokeless tobacco: Never Used  . Tobacco comment: 1/2 ppd > 60 years as of 06/27/17: 7 cigarettes or less a day  Substance and Sexual Activity  . Alcohol use: Not Currently    Comment: history of ETOH abuse, stopped 1.5 years ago (had one drink at [place] [employer]).   . Drug use: No  . Sexual activity: Yes    Birth control/protection: None  Lifestyle  . Physical activity:    Days per week: Not on file    Minutes per session: Not on file  . Stress: Not on file  Relationships  . Social connections:    Talks on phone: Not on file    Gets together: Not on file    Attends religious service: Not on file    Active member of  club or organization: Not on file    Attends meetings of clubs or organizations: Not on file    Relationship status: Not on file  . Intimate partner violence:    Fear of current or ex partner: Not on file    Emotionally abused: Not on file    Physically abused: Not on file    Forced sexual activity: Not on file  Other Topics Concern  . Not on file  Social History Narrative  . Not on file    FAMILY HISTORY:  Family History  Problem Relation Age of Onset  . Stroke Mother 46  . Heart disease Father 12       MI  . Diabetes Maternal Uncle   . Colon cancer Neg Hx   . Cancer Neg Hx     CURRENT MEDICATIONS:  Outpatient Encounter Medications as of 09/18/2017  Medication Sig Note  . cholecalciferol (VITAMIN D) 1000 units tablet Take 1,000 Units by mouth daily.   . Nutritional Supplements (ENSURE ENLIVE PO) Take 237 mLs by mouth. 2-3/day   . PROAIR HFA 108 (90 Base) MCG/ACT inhaler Inhale 2 puffs into the lungs every 6 (six) hours as needed (for shortness of breath/wheezing/cough).    . Propylene Glycol-Glycerin (SOOTHE) 0.6-0.6 % SOLN Place 1-2 drops into both eyes 3 (three) times daily as needed (for dry/irritated eyes.).   Marland Kitchen Skin Protectants, Misc. (EUCERIN) cream Apply 1 application topically 4 (four) times daily as needed for dry skin (for  itchy or dry skin).   . Tiotropium Bromide Monohydrate (SPIRIVA RESPIMAT) 2.5 MCG/ACT AERS Inhale 2 puffs into the lungs every evening.    Marland Kitchen CARBOPLATIN IV Inject into the vein. Weekly during radiation 05/31/2017: Not started therapy course yet   . lidocaine-prilocaine (EMLA) cream Apply a quarter size amount to affected area 1 hour prior to coming to chemotherapy.  Do not rub in.  Cover with plastic wrap. (Patient not taking: Reported on 09/18/2017) 05/31/2017: Not started therapy course yet  . ondansetron (ZOFRAN) 8 MG tablet Take 1 tablet (8 mg total) by mouth every 8 (eight) hours as needed for nausea or vomiting. (Patient not taking: Reported on 09/18/2017) 05/31/2017: Not started therapy course yet   . PACLitaxel (TAXOL IV) Inject into the vein. Weekly during radiation 05/31/2017: Not started therapy course yet    No facility-administered encounter medications on file as of 09/18/2017.     ALLERGIES:  No Known Allergies   PHYSICAL EXAM:  ECOG Performance status: 1  Vitals:   09/18/17 1545  BP: (!) 167/70  Pulse: 67  Resp: 16  Temp: 98 F (36.7 C)  SpO2: 94%   Filed Weights   09/18/17 1545  Weight: 177 lb 12.8 oz (80.6 kg)    Physical Exam   LABORATORY DATA:  I have reviewed the labs as listed.  CBC    Component Value Date/Time   WBC 4.6 09/07/2017 0921   RBC 4.20 (L) 09/07/2017 0921   HGB 14.1 09/07/2017 0921   HCT 42.6 09/07/2017 0921   PLT 151 09/07/2017 0921   MCV 101.4 (H) 09/07/2017 0921   MCH 33.6 09/07/2017 0921   MCHC 33.1 09/07/2017 0921   RDW 15.7 (H) 09/07/2017 0921   LYMPHSABS 0.6 (L) 09/07/2017 0921   MONOABS 0.5 09/07/2017 0921   EOSABS 0.1 09/07/2017 0921   BASOSABS 0.0 09/07/2017 0921   CMP Latest Ref Rng & Units 09/07/2017 07/16/2017 07/09/2017  Glucose 70 - 99 mg/dL 100(H) 94 96  BUN 8 - 23 mg/dL  _0 Creatinine 0.61 - 1.24 mg/dL 0.92 0.84 0.79  Sodium 135 - 145 mmol/L 139 138 139  Potassium 3.5 - 5.1 mmol/L 4.0 4.3 4.5  Chloride 98 - 111  mmol/L 101 101 100(L)  CO2 22 - 32 mmol/L _1 Calcium 8.9 - 10.3 mg/dL 9.1 9.1 9.1  Total Protein 6.5 - 8.1 g/dL 7.5 7.0 7.0  Total Bilirubin 0.3 - 1.2 mg/dL 0.9 1.1 0.9  Alkaline Phos 38 - 126 U/L 72 55 56  AST 15 - 41 U/L _2 ALT 0 - 44 U/L _3 DIAGNOSTIC IMAGING:  I have independently reviewed CT scan report dated 09/07/2017 and discussed with the patient.     ASSESSMENT & PLAN:   Malignant neoplasm of bronchus and lung (H[facility]) 1.  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 -Chemoradiation therapy with carboplatin and paclitaxel weekly from 06/11/2017 through 07/16/2017. -We reviewed the results of the CT scan of the chest dated 09/07/2017 which showed 10 x 7 mm right upper lobe nodule which has not substantially changed in the interval (previously measured 10 x 8 mm).  However there was no right paratracheal adenopathy visible in the scan.  He can be considered as having partial response.  Hence I have recommended consolidation therapy with durvalumab every 2 weeks for 26 treatments based on excellent results from [place] trial.  We talked about the immunotherapy related side effects including dermatitis, colitis, pneumonitis, hypophysitis, thyroid abnormalities among others.  He understands and gives his permission to proceed with the treatment.  We will get prior authorization from his insurance and start his treatment as soon as possible.  I will see him back 2 weeks after his first treatment.  We will continue to monitor his disease status by doing scans every 3 months.  2.  Neuropathy: I have cut back on paclitaxel dose by 10% during week 3.  His heaviness in the legs have improved.  He has occasional numbness in the feet when he  lies down a long time.      Orders placed this encounter:  Orders Placed This Encounter  Procedures  . CBC with Differential/Platelet  . Comprehensive metabolic panel  . TSH      Derek Jack, [place] [phone]

## 2017-09-18 NOTE — Progress Notes (Signed)
START ON PATHWAY REGIMEN - Non-Small Cell Lung     A cycle is every 14 days:     Durvalumab   **Always confirm dose/schedule in your pharmacy ordering system**  Patient Characteristics: Stage III - Unresectable, PS = 0, 1 AJCC T Category: T1b Current Disease Status: No Distant Mets or Local Recurrence AJCC N Category: N2 AJCC M Category: M0 AJCC 8 Stage Grouping: IIIA Performance Status: PS = 0, 1 Intent of Therapy: Non-Curative / Palliative Intent, Discussed with Patient

## 2017-09-18 NOTE — Patient Instructions (Signed)
[facility] Discharge Instructions  You saw Dr. Katragadda today.   Thank you for choosing [facility] at [facility] to provide your oncology and hematology care.  To afford each patient quality time with our provider, please arrive at least 15 minutes before your scheduled appointment time.   If you have a lab appointment with the Cancer Center please come in thru the  Main Entrance and check in at the main information desk  You need to re-schedule your appointment should you arrive 10 or more minutes late.  We strive to give you quality time with our providers, and arriving late affects you and other patients whose appointments are after yours.  Also, if you no show three or more times for appointments you may be dismissed from the clinic at the providers discretion.     Again, thank you for choosing [facility].  Our hope is that these requests will decrease the amount of time that you wait before being seen by our physicians.       _____________________________________________________________  Should you have questions after your visit to [facility], please contact our office at [phone] between the hours of 8:30 a.m. and 4:30 p.m.  Voicemails left after 4:30 p.m. will not be returned until the following business day.  For prescription refill requests, have your pharmacy contact our office.       Resources For Cancer Patients and their Caregivers ? American Cancer Society: Can assist with transportation, wigs, general needs, runs Look Good Feel Better.        1-888-227-6333 ? Cancer Care: Provides financial assistance, online support groups, medication/co-pay assistance.  1-800-813-HOPE (4673) ? [facility] Assists [place] cancer patients and their families through emotional , educational and financial support.  336-427-4357 ? [place] DSS Where to apply for  food stamps, Medicaid and utility assistance. 336-342-1394 ? [facility]: Transportation to medical appointments. 336-347-2287 ? Social Security Administration: May apply for disability if have a Stage IV cancer. 336-342-7796 1-800-772-1213 ? [place] Aging, Disability and Transit Services: Assists with nutrition, care and transit needs. 336-349-2343  Cancer Center Support Programs:   > Cancer Support Group  2nd Tuesday of the month 1pm-2pm, Journey Room   > Creative Journey  3rd Tuesday of the month 1130am-1pm, Journey Room     

## 2017-09-18 NOTE — Assessment & Plan Note (Signed)
1.  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 -Chemoradiation therapy with carboplatin and paclitaxel weekly from 06/11/2017 through 07/16/2017. -We reviewed the results of the CT scan of the chest dated 09/07/2017 which showed 10 x 7 mm right upper lobe nodule which has not substantially changed in the interval (previously measured 10 x 8 mm).  However there was no right paratracheal adenopathy visible in the scan.  He can be considered as having partial response.  Hence I have recommended consolidation therapy with durvalumab every 2 weeks for 26 treatments based on excellent results from [place] trial.  We talked about the immunotherapy related side effects including dermatitis, colitis, pneumonitis, hypophysitis, thyroid abnormalities among others.  He understands and gives his permission to proceed with the treatment.  We will get prior authorization from his insurance and start his treatment as soon as possible.  I will see him back 2 weeks after his first treatment.  We will continue to monitor his disease status by doing scans every 3 months.  2.  Neuropathy: I have cut back on paclitaxel dose by 10% during week 3.  His heaviness in the legs have improved.  He has occasional numbness in the feet when he lies down a long time.

## 2017-09-19 NOTE — Patient Instructions (Signed)
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  You are going to be treated with the intent of controlling your disease. This means that it is not curable but treatable.  You will be getting the drug Imfinzi (Durvalumab) every 14 days or every 2 weeks.  You will see the doctor regularly throughout treatment.  We monitor your lab work prior to every treatment. The doctor monitors your response to treatment by the way you are feeling, your blood work, and scans periodically.  There will be wait times while you are here for treatment.  It will take about 30 minutes to 1 hour for your lab work to result.  Then there will be wait times while pharmacy mixes your medications.     POTENTIAL SIDE EFFECTS OF TREATMENT: Durvalumab (Imfinzi)  About This Drug Durvalumab is used to treat cancer. This drug is given in the vein (IV).  Possible Side Effects . Tiredness . Muscle and bone pain . Constipation (not able to move bowels) . Decreased appetite (decreased hunger) . Nausea and throwing up (vomiting). . Swelling of your legs, ankles and/or feet . Urinary tract infection. Symptoms may include: . pain or burning when you pass urine . feeling like you have to pass urine often, but not much comes out when you do. . tender or heavy feeling in your lower abdomen . cloudy urine and/or urine that smells bad . pain on one side of your back under your ribs. This is where your kidneys are. . fever, chills, nausea and/or throwing up Note: Each of the side effects above was reported in 15% or greater of patients treated with durvalumab. Not all possible side effects are included above.  Warnings and Precautions . Inflammation (swelling) of the lungs. You may have a dry cough or trouble breathing. . Colitis which is swelling in the colon. The symptoms are loose bowel movements (diarrhea) stomach cramping, and sometimes blood in the bowel  movements. . Changes in your liver function. . This drug may affect how your kidneys work . This drug may affect some of your hormone glands (especially the thyroid, adrenals, pituitary and pancreas). Your doctor may check your hormone levels as needed. . Increased risk of infection. You may get a fever and chills. . While you are getting this drug in your vein (IV), you may have a reaction to the drug. Sometimes you may be given medication to stop or lessen these side effects. Your nurse will check you closely for these signs: fever or shaking chills, flushing, facial swelling, feeling dizzy, headache, trouble breathing, rash, itching, chest tightness, or chest pain. These reactions may happen for 24 hours after your infusion. If this happens, call 911 for emergency care.  Treating Side Effects . Manage tiredness by pacing your activities for the day. Be sure to include periods of rest between energy-draining activities . Keeping your pain under control is important to your well-being. Please tell your doctor or nurse if you are experiencing pain. . Ask your doctor or nurse about medicines that are available to help stop or lessen constipation. . If you are not able to move your bowels, check with your doctor or nurse before you use enemas, laxatives, or suppositories . Drink plenty of fluids (a minimum of eight glasses per day is recommended). . If you throw up or have loose bowel movements, you should drink more fluids so that you do not become dehydrated (lack water in the  body from losing too much fluid). . To help with nausea and vomiting, eat small, frequent meals instead of three large meals a day. Choose foods and drinks that are at room temperature. Ask your nurse or doctor about other helpful tips and medicine that is available to help or stop lessen these symptoms. . To help with decreased appetite, eat small, frequent meals . Eat high caloric food such as pudding, ice cream, yogurt and  milkshakes. . Infusion reactions may happen for 24 hours after your infusion. If this happens, call 911 for emergency care.  Food and Drug Interactions . There are no known interactions of durvalumab with food. This drug may interact with other medicines. Tell your doctor and pharmacist about all the medicines and dietary supplements (vitamins, minerals, herbs and others) that you are taking at this time. The safety and use of dietary supplements and alternative diets are often not known. Using these might affect your cancer or interfere with your treatment. Until more is known, you should not use dietary supplements or alternative diets without your cancer doctor's help.  When to Call the Doctor Call your doctor or nurse if you have any of these symptoms and/or any new or unusual symptoms: . Fever of 100.5 F (38 C) or higher . Chills, flushing . Wheezing, chest pain or trouble breathing . Facial swelling . Rash, itching or skin blistering . Rash that is not relieved by prescribed medicines . Pain when passing urine . Feeling dizzy or lightheaded . Confusion . Nausea that stops you from eating or drinking or is not relieved by prescribed medicine . No bowel movement for 3 days or you feel uncomfortable . Severe pain in the stomach area . Pain or burning when you pass urine. . Difficulty urinating . Feeling like you have to pass urine often, but not much comes out when you do. . Tender or heavy feeling in your lower abdomen. . Cloudy urine and/or urine that smells bad. . Pain on one side of your back under your ribs. This is where your kidneys are. . Decreased urine . Unusual thirst or passing urine often . Fatigue that interferes with your daily activities . Signs of liver problems: dark urine, pale bowel movements, bad stomach pain, feeling very tired and weak, unusual itching, or yellowing of the eyes or skin . Signs of infusion reaction: fever or shaking chills, flushing, facial  swelling, feeling dizzy, headache, trouble breathing, rash, itching, chest tightness, or chest pain. . If you think you are pregnant  Reproduction Warnings . Pregnancy warning: This drug can have harmful effects on the unborn baby. It is recommended that effective methods of birth control should be used by women of child-bearing age during cancer treatment and for at least 3 months after treatment. . Breast feeding warning: Women should not breast feed during treatment and for 3 month after treatment because this drug could enter the breast milk and cause harm to a breast feeding baby.  SELF CARE ACTIVITIES WHILE ON CHEMOTHERAPY:  Hydration Increase your fluid intake 48 hours prior to treatment and drink at least 8 to 12 cups (64 ounces) of water/decaffeinated beverages per day after treatment. You can still have your cup of coffee or soda but these beverages do not count as part of your 8 to 12 cups that you need to drink daily. No alcohol intake.  Medications Continue taking your normal prescription medication as prescribed.  If you start any new herbal or new supplements please let us  know first to make sure it is safe.  Mouth Care Have teeth cleaned professionally before starting treatment. Keep dentures and partial plates clean. Use soft toothbrush and do not use mouthwashes that contain alcohol. Biotene is a good mouthwash that is available at most pharmacies or may be ordered by calling [phone]. Use warm salt water gargles (1 teaspoon salt per 1 quart warm water) before and after meals and at bedtime. Or you may rinse with 2 tablespoons of three-percent hydrogen peroxide mixed in eight ounces of water. If you are still having problems with your mouth or sores in your mouth please call the clinic. If you need dental work, please let the doctor know before you go for your appointment so that we can coordinate the best possible time for you in regards to your chemo regimen. You need to  also let your dentist know that you are actively taking chemo. We may need to do labs prior to your dental appointment.  Skin Care Always use sunscreen that has not expired and with SPF (Sun Protection Factor) of 50 or higher. Wear hats to protect your head from the sun. Remember to use sunscreen on your hands, ears, face, & feet.  Use good moisturizing lotions such as udder cream, eucerin, or even Vaseline. Some chemotherapies can cause dry skin, color changes in your skin and nails.    . Avoid long, hot showers or baths. . Use gentle, fragrance-free soaps and laundry detergent. . Use moisturizers, preferably creams or ointments rather than lotions because the thicker consistency is better at preventing skin dehydration. Apply the cream or ointment within 15 minutes of showering. Reapply moisturizer at night, and moisturize your hands every time after you wash them.  Hair Loss (if your doctor says your hair will fall out)  . If your doctor says that your hair is likely to fall out, decide before you begin chemo whether you want to wear a wig. You may want to shop before treatment to match your hair color. . Hats, turbans, and scarves can also camouflage hair loss, although some people prefer to leave their heads uncovered. If you go bare-headed outdoors, be sure to use sunscreen on your scalp. . Cut your hair short. It eases the inconvenience of shedding lots of hair, but it also can reduce the emotional impact of watching your hair fall out. . Don't perm or color your hair during chemotherapy. Those chemical treatments are already damaging to hair and can enhance hair loss. Once your chemo treatments are done and your hair has grown back, it's OK to resume dyeing or perming hair. With chemotherapy, hair loss is almost always temporary. But when it grows back, it may be a different color or texture. In older adults who still had hair color before chemotherapy, the new growth may be completely gray.   Often, new hair is very fine and soft.  Infection Prevention Please wash your hands for at least 30 seconds using warm soapy water. Handwashing is the #1 way to prevent the spread of germs. Stay away from sick people or people who are getting over a cold. If you develop respiratory systems such as green/yellow mucus production or productive cough or persistent cough let us know and we will see if you need an antibiotic. It is a good idea to keep a pair of gloves on when going into grocery stores/[employer] to decrease your risk of coming into contact with germs on the carts, etc. Carry alcohol hand gel with  you at all times and use it frequently if out in public. If your temperature reaches 100.5 or higher please call the clinic and let us know.  If it is after hours or on the weekend please go to the ER if your temperature is over 100.5.  Please have your own personal thermometer at home to use.    Sex and bodily fluids If you are going to have sex, a condom must be used to protect the person that isn't taking chemotherapy. Chemo can decrease your libido (sex drive). For a few days after chemotherapy, chemotherapy can be excreted through your bodily fluids.  When using the toilet please close the lid and flush the toilet twice.  Do this for a few day after you have had chemotherapy.   Effects of chemotherapy on your sex life Some changes are simple and won't last long. They won't affect your sex life permanently. Sometimes you may feel: . too tired . not strong enough to be very active . sick or sore  . not in the mood . anxious or low Your anxiety might not seem related to sex. For example, you may be worried about the cancer and how your treatment is going. Or you may be worried about money, or about how you family are coping with your illness. These things can cause stress, which can affect your interest in sex. It's important to talk to your partner about how you feel. Remember - the changes to  your sex life don't usually last long. There's usually no medical reason to stop having sex during chemo. The drugs won't have any long term physical effects on your performance or enjoyment of sex. Cancer can't be passed on to your partner during sex  Contraception It's important to use reliable contraception during treatment. Avoid getting pregnant while you or your partner are having chemotherapy. This is because the drugs may harm the baby. Sometimes chemotherapy drugs can leave a man or woman infertile.  This means you would not be able to have children in the future. You might want to talk to someone about permanent infertility. It can be very difficult to learn that you may no longer be able to have children. Some people find counselling helpful. There might be ways to preserve your fertility, although this is easier for men than for women. You may want to speak to a fertility expert. You can talk about sperm banking or harvesting your eggs. You can also ask about other fertility options, such as donor eggs. If you have or have had breast cancer, your doctor might advise you not to take the contraceptive pill. This is because the hormones in it might affect the cancer.  It is not known for sure whether or not chemotherapy drugs can be passed on through semen or secretions from the vagina. Because of this some doctors advise people to use a barrier method if you have sex during treatment. This applies to vaginal, anal or oral sex. Generally, doctors advise a barrier method only for the time you are actually having the treatment and for about a week after your treatment. Advice like this can be worrying, but this does not mean that you have to avoid being intimate with your partner. You can still have close contact with your partner and continue to enjoy sex.  Animals If you have cats or birds we just ask that you not change the litter or change the cage.  Please have someone else do this for  you  while you are on chemotherapy.   Food Safety During and After Cancer Treatment Food safety is important for people both during and after cancer treatment. Cancer and cancer treatments, such as chemotherapy, radiation therapy, and stem cell/bone marrow transplantation, often weaken the immune system. This makes it harder for your body to protect itself from foodborne illness, also called food poisoning. Foodborne illness is caused by eating food that contains harmful bacteria, parasites, or viruses.  Foods to avoid Some foods have a higher risk of becoming tainted with bacteria. These include: Marland Kitchen Unwashed fresh fruit and vegetables, especially leafy vegetables that can hide dirt and other contaminants . Raw sprouts, such as alfalfa sprouts . Raw or undercooked beef, especially ground beef, or other raw or undercooked meat and poultry . Fatty, fried, or spicy foods immediately before or after treatment.  These can sit heavy on your stomach and make you feel nauseous. . Raw or undercooked shellfish, such as oysters. . Sushi and sashimi, which often contain raw fish.  . Unpasteurized beverages, such as unpasteurized fruit juices, raw milk, raw yogurt, or cider . Undercooked eggs, such as soft boiled, over easy, and poached; raw, unpasteurized eggs; or foods made with raw egg, such as homemade raw cookie dough and homemade mayonnaise Simple steps for food safety Shop smart. . Do not buy food stored or displayed in an unclean area. . Do not buy bruised or damaged fruits or vegetables. . Do not buy cans that have cracks, dents, or bulges. . Pick up foods that can spoil at the end of your shopping trip and store them in a cooler on the way home. Prepare and clean up foods carefully. . Rinse all fresh fruits and vegetables under running water, and dry them with a clean towel or paper towel. . Clean the top of cans before opening them. . After preparing food, wash your hands for 20 seconds with hot  water and soap. Pay special attention to areas between fingers and under nails. . Clean your utensils and dishes with hot water and soap. Marland Kitchen Disinfect your kitchen and cutting boards using 1 teaspoon of liquid, unscented bleach mixed into 1 quart of water.   Dispose of old food. . Eat canned and packaged food before its expiration date (the "use by" or "best before" date). . Consume refrigerated leftovers within 3 to 4 days. After that time, throw out the food. Even if the food does not smell or look spoiled, it still may be unsafe. Some bacteria, such as Listeria, can grow even on foods stored in the refrigerator if they are kept for too long. Take precautions when eating out. . At restaurants, avoid buffets and salad bars where food sits out for a long time and comes in contact with many people. Food can become contaminated when someone with a virus, often a norovirus, or another "bug" handles it. . Put any leftover food in a "to-go" container yourself, rather than having the server do it. And, refrigerate leftovers as soon as you get home. . Choose restaurants that are clean and that are willing to prepare your food as you order it cooked.   MEDICATIONS:  Compazine/Prochlorperazine 10mg  tablet. Take 1 tablet every 6 hours as needed for nausea/vomiting. (This can make you sleepy)   EMLA cream. Apply a quarter size amount to port site 1 hour prior to chemo. Do not rub in. Cover with plastic wrap.   Over-the-Counter Meds:  Miralax 1 capful in 8 oz of fluid daily. May increase to two times a day if needed. This is a stool softener. If this doesn't work proceed you can add:  Senokot S-start with 1 tablet two times a day and increase to 4 tablets two times a day if needed. (Total of 8 tablets in a 24 hour period). This is a stimulant  laxative.   Call us if this does not help your bowels move.   Imodium 2mg  capsule. Take 2 capsules after the 1st loose stool and then 1 capsule every 2 hours until you go a total of 12 hours without having a loose stool. Call the [facility] if loose stools continue. If diarrhea occurs @ bedtime, take 2 capsules @ bedtime. Then take 2 capsules every 4 hours until morning. Call [facility].    Diarrhea Sheet   If you are having loose stools/diarrhea, please purchase Imodium and begin taking as outlined:  At the first sign of poorly formed or loose stools you should begin taking Imodium (loperamide) 2 mg capsules.  Take two caplets (4mg ) followed by one caplet (2mg ) every 2 hours until you have had no diarrhea for 12 hours.  During the night take two caplets (4mg ) at bedtime and continue every 4 hours during the night until the morning.  Stop taking Imodium only after there is no sign of diarrhea for 12 hours.    Always call the [facility] if you are having loose stools/diarrhea that you can't get under control.  Loose stools/diarrhea leads to dehydration (loss of water) in your body.  We have other options of trying to get the loose stools/diarrhea to stop but you must let us know!   Constipation Sheet *Miralax in 8 oz. of fluid daily.  May increase to two times a day if needed.  This is a stool softener.  If this not enough to keep your bowel regular:  You can add:  *Senokot S, start with one tablet twice a day and can increase to 4 tablets twice a day if needed.  This is a stimulant laxative.   Sometimes when you take pain medication you need BOTH a medicine to keep your stool soft and a medicine to help your bowel push it out!  Please call if the above does not work for you.   Do not go more than 2 days without a bowel movement.  It is very important that you do not become constipated.  It will make you feel sick to your stomach (nausea) and can cause abdominal pain and  vomiting.   Nausea Sheet   Compazine/Prochlorperazine 10mg  tablet. Take 1 tablet every 6 hours as needed for nausea/vomiting. (This can make you sleepy)  If you are having persistent nausea (nausea that does not stop) please call the [facility] and let us know the amount of nausea that you are experiencing.  If you begin to vomit, you need to call the [facility] and if it is the weekend and you have vomited more than one time and can't get it to stop-go to the Emergency Room.  Persistent nausea/vomiting can lead to dehydration (loss of fluid in your body) and will make you feel terrible.  Ice chips, sips of clear liquids, foods that are @ room temperature, crackers, and toast tend to be better tolerated.  SYMPTOMS TO REPORT AS SOON AS POSSIBLE AFTER TREATMENT:  FEVER GREATER THAN 100.5 F  CHILLS WITH OR WITHOUT FEVER  NAUSEA AND VOMITING THAT IS NOT CONTROLLED WITH YOUR NAUSEA MEDICATION  UNUSUAL SHORTNESS OF BREATH  UNUSUAL BRUISING OR BLEEDING  TENDERNESS IN MOUTH AND THROAT WITH OR WITHOUT PRESENCE OF ULCERS  URINARY PROBLEMS  BOWEL PROBLEMS  UNUSUAL RASH    Wear comfortable clothing and clothing appropriate for easy access to any Portacath or PICC line. Let us know if there is anything that we can do to make your therapy better!    What to do if you need assistance after hours or on the weekends: CALL [phone].  HOLD on the line, do not hang up.  You will hear multiple messages but at the end you will be connected with a nurse triage line.  They will contact the doctor if necessary.  Most of the time they will be able to assist you.  Do not call the hospital operator.      I have been informed and understand all of the instructions given to me and have received a copy. I have been instructed to call the clinic [phone] or my family physician as soon as possible for continued medical care, if indicated. I do not have any more questions at this time but  understand that I may call the [facility] or the Patient Navigator at [phone] during office hours should I have questions or need assistance in obtaining follow-up care.

## 2017-09-19 NOTE — Progress Notes (Signed)
Immunotherapy teaching pulled together.

## 2017-09-20 ENCOUNTER — Inpatient Hospital Stay (HOSPITAL_COMMUNITY): Payer: PPO

## 2017-09-20 ENCOUNTER — Encounter (HOSPITAL_COMMUNITY): Payer: Self-pay

## 2017-09-20 VITALS — BP 147/56 | HR 59 | Temp 97.3°F | Resp 18 | Wt 177.2 lb

## 2017-09-20 DIAGNOSIS — C3490 Malignant neoplasm of unspecified part of unspecified bronchus or lung: Secondary | ICD-10-CM

## 2017-09-20 DIAGNOSIS — C3411 Malignant neoplasm of upper lobe, right bronchus or lung: Secondary | ICD-10-CM | POA: Diagnosis not present

## 2017-09-20 DIAGNOSIS — C3491 Malignant neoplasm of unspecified part of right bronchus or lung: Secondary | ICD-10-CM

## 2017-09-20 LAB — CBC WITH DIFFERENTIAL/PLATELET
Basophils Absolute: 0 10*3/uL (ref 0.0–0.1)
Basophils Relative: 0 %
Eosinophils Absolute: 0.2 10*3/uL (ref 0.0–0.7)
Eosinophils Relative: 5 %
HCT: 43.3 % (ref 39.0–52.0)
Hemoglobin: 14.4 g/dL (ref 13.0–17.0)
Lymphocytes Relative: 15 %
Lymphs Abs: 0.7 10*3/uL (ref 0.7–4.0)
MCH: 34 pg (ref 26.0–34.0)
MCHC: 33.3 g/dL (ref 30.0–36.0)
MCV: 102.1 fL — ABNORMAL HIGH (ref 78.0–100.0)
Monocytes Absolute: 0.5 10*3/uL (ref 0.1–1.0)
Monocytes Relative: 11 %
Neutro Abs: 3.3 10*3/uL (ref 1.7–7.7)
Neutrophils Relative %: 69 %
Platelets: 145 10*3/uL — ABNORMAL LOW (ref 150–400)
RBC: 4.24 MIL/uL (ref 4.22–5.81)
RDW: 14.8 % (ref 11.5–15.5)
WBC: 4.7 10*3/uL (ref 4.0–10.5)

## 2017-09-20 LAB — COMPREHENSIVE METABOLIC PANEL
ALT: 16 U/L (ref 0–44)
AST: 23 U/L (ref 15–41)
Albumin: 4.1 g/dL (ref 3.5–5.0)
Alkaline Phosphatase: 78 U/L (ref 38–126)
Anion gap: 7 (ref 5–15)
BUN: 19 mg/dL (ref 8–23)
CO2: 32 mmol/L (ref 22–32)
Calcium: 9.2 mg/dL (ref 8.9–10.3)
Chloride: 101 mmol/L (ref 98–111)
Creatinine, Ser: 0.93 mg/dL (ref 0.61–1.24)
GFR calc Af Amer: >60 mL/min (ref >60)
GFR calc non Af Amer: >60 mL/min (ref >60)
Glucose, Bld: 85 mg/dL (ref 70–99)
Potassium: 3.7 mmol/L (ref 3.5–5.1)
Sodium: 140 mmol/L (ref 135–145)
Total Bilirubin: 1 mg/dL (ref 0.3–1.2)
Total Protein: 7.4 g/dL (ref 6.5–8.1)

## 2017-09-20 LAB — TSH: TSH: 2.545 u[IU]/mL (ref 0.350–4.500)

## 2017-09-20 MED ORDER — SODIUM CHLORIDE 0.9 % IV SOLN
860.0000 mg | Freq: Once | INTRAVENOUS | Status: AC
  Administered 2017-09-20: 860 mg via INTRAVENOUS
  Filled 2017-09-20: qty 7.2

## 2017-09-20 MED ORDER — HEPARIN SOD (PORK) LOCK FLUSH 100 UNIT/ML IV SOLN
500.0000 [IU] | Freq: Once | INTRAVENOUS | Status: AC | PRN
  Administered 2017-09-20: 500 [IU]

## 2017-09-20 MED ORDER — SODIUM CHLORIDE 0.9 % IV SOLN
Freq: Once | INTRAVENOUS | Status: AC
  Administered 2017-09-20: 500 mL via INTRAVENOUS

## 2017-09-20 MED ORDER — SODIUM CHLORIDE 0.9% FLUSH
10.0000 mL | INTRAVENOUS | Status: DC | PRN
  Administered 2017-09-20: 10 mL
  Filled 2017-09-20: qty 10

## 2017-09-20 NOTE — Progress Notes (Signed)
Patient for Imfinzi today.  Consent signed with all questions asked and answered.  Reviewed side effects and management and reminded of the telephone number to call for any questions or concerns.  Patient and friend verbalized understanding.   Patient tolerated therapy with no complaints voiced.  Port site clean and dry with no bruising or swelling noted.  Good blood return noted before and after administration of therapy.  Band aid applied.  VSS with discharge and left ambulatory with friend with no s/s of distress noted.

## 2017-09-20 NOTE — Patient Instructions (Signed)
[facility] Discharge Instructions for Patients Receiving Chemotherapy  Today you received the following chemotherapy agents imfinzi.    If you develop nausea and vomiting that is not controlled by your nausea medication, call the clinic.   BELOW ARE SYMPTOMS THAT SHOULD BE REPORTED IMMEDIATELY:  *FEVER GREATER THAN 100.5 F  *CHILLS WITH OR WITHOUT FEVER  NAUSEA AND VOMITING THAT IS NOT CONTROLLED WITH YOUR NAUSEA MEDICATION  *UNUSUAL SHORTNESS OF BREATH  *UNUSUAL BRUISING OR BLEEDING  TENDERNESS IN MOUTH AND THROAT WITH OR WITHOUT PRESENCE OF ULCERS  *URINARY PROBLEMS  *BOWEL PROBLEMS  UNUSUAL RASH Items with * indicate a potential emergency and should be followed up as soon as possible.  Feel free to call the clinic should you have any questions or concerns. The clinic phone number is [phone].  Please show the [facility] at check-in to the Emergency Department and triage nurse.

## 2017-10-04 ENCOUNTER — Inpatient Hospital Stay (HOSPITAL_COMMUNITY): Payer: PPO | Attending: Hematology | Admitting: Hematology

## 2017-10-04 ENCOUNTER — Inpatient Hospital Stay (HOSPITAL_COMMUNITY): Payer: PPO

## 2017-10-04 ENCOUNTER — Other Ambulatory Visit: Payer: Self-pay

## 2017-10-04 ENCOUNTER — Encounter (HOSPITAL_COMMUNITY): Payer: Self-pay | Admitting: Hematology

## 2017-10-04 ENCOUNTER — Encounter (HOSPITAL_COMMUNITY): Payer: Self-pay

## 2017-10-04 VITALS — BP 138/64 | HR 60 | Temp 97.4°F | Resp 18

## 2017-10-04 DIAGNOSIS — Z5112 Encounter for antineoplastic immunotherapy: Secondary | ICD-10-CM | POA: Insufficient documentation

## 2017-10-04 DIAGNOSIS — C3491 Malignant neoplasm of unspecified part of right bronchus or lung: Secondary | ICD-10-CM

## 2017-10-04 DIAGNOSIS — T451X5A Adverse effect of antineoplastic and immunosuppressive drugs, initial encounter: Secondary | ICD-10-CM

## 2017-10-04 DIAGNOSIS — G620 Drug-induced polyneuropathy: Secondary | ICD-10-CM | POA: Diagnosis not present

## 2017-10-04 DIAGNOSIS — C3490 Malignant neoplasm of unspecified part of unspecified bronchus or lung: Secondary | ICD-10-CM

## 2017-10-04 DIAGNOSIS — C3411 Malignant neoplasm of upper lobe, right bronchus or lung: Secondary | ICD-10-CM | POA: Insufficient documentation

## 2017-10-04 DIAGNOSIS — F17210 Nicotine dependence, cigarettes, uncomplicated: Secondary | ICD-10-CM

## 2017-10-04 LAB — COMPREHENSIVE METABOLIC PANEL
ALT: 16 U/L (ref 0–44)
AST: 26 U/L (ref 15–41)
Albumin: 3.9 g/dL (ref 3.5–5.0)
Alkaline Phosphatase: 75 U/L (ref 38–126)
Anion gap: 7 (ref 5–15)
BUN: 20 mg/dL (ref 8–23)
CO2: 30 mmol/L (ref 22–32)
Calcium: 9.1 mg/dL (ref 8.9–10.3)
Chloride: 102 mmol/L (ref 98–111)
Creatinine, Ser: 0.87 mg/dL (ref 0.61–1.24)
GFR calc Af Amer: >60 mL/min (ref >60)
GFR calc non Af Amer: >60 mL/min (ref >60)
Glucose, Bld: 102 mg/dL — ABNORMAL HIGH (ref 70–99)
Potassium: 3.9 mmol/L (ref 3.5–5.1)
Sodium: 139 mmol/L (ref 135–145)
Total Bilirubin: 0.9 mg/dL (ref 0.3–1.2)
Total Protein: 7.2 g/dL (ref 6.5–8.1)

## 2017-10-04 LAB — CBC WITH DIFFERENTIAL/PLATELET
Basophils Absolute: 0 10*3/uL (ref 0.0–0.1)
Basophils Relative: 0 %
Eosinophils Absolute: 0.2 10*3/uL (ref 0.0–0.7)
Eosinophils Relative: 4 %
HCT: 42.6 % (ref 39.0–52.0)
Hemoglobin: 14.1 g/dL (ref 13.0–17.0)
Lymphocytes Relative: 17 %
Lymphs Abs: 0.9 10*3/uL (ref 0.7–4.0)
MCH: 33.7 pg (ref 26.0–34.0)
MCHC: 33.1 g/dL (ref 30.0–36.0)
MCV: 101.7 fL — ABNORMAL HIGH (ref 78.0–100.0)
Monocytes Absolute: 0.5 10*3/uL (ref 0.1–1.0)
Monocytes Relative: 9 %
Neutro Abs: 3.7 10*3/uL (ref 1.7–7.7)
Neutrophils Relative %: 70 %
Platelets: 146 10*3/uL — ABNORMAL LOW (ref 150–400)
RBC: 4.19 MIL/uL — ABNORMAL LOW (ref 4.22–5.81)
RDW: 14 % (ref 11.5–15.5)
WBC: 5.3 10*3/uL (ref 4.0–10.5)

## 2017-10-04 MED ORDER — SODIUM CHLORIDE 0.9 % IV SOLN
10.6000 mg/kg | Freq: Once | INTRAVENOUS | Status: AC
  Administered 2017-10-04: 860 mg via INTRAVENOUS
  Filled 2017-10-04: qty 7.2

## 2017-10-04 MED ORDER — SODIUM CHLORIDE 0.9% FLUSH
10.0000 mL | INTRAVENOUS | Status: DC | PRN
  Administered 2017-10-04: 10 mL
  Filled 2017-10-04: qty 10

## 2017-10-04 MED ORDER — SODIUM CHLORIDE 0.9 % IV SOLN
Freq: Once | INTRAVENOUS | Status: AC
  Administered 2017-10-04: 10:00:00 via INTRAVENOUS

## 2017-10-04 MED ORDER — HEPARIN SOD (PORK) LOCK FLUSH 100 UNIT/ML IV SOLN
500.0000 [IU] | Freq: Once | INTRAVENOUS | Status: AC | PRN
  Administered 2017-10-04: 500 [IU]

## 2017-10-04 NOTE — Progress Notes (Signed)
Patient seen today by Dr. Delton Coombes. Patient stated no new issues since cycle 1. Labs reviewed by MD. Proceed with treatment today per MD.  Treatment given per orders. Patient tolerated it well without problems. Vitals stable and discharged home from clinic ambulatory. Follow up as scheduled.

## 2017-10-04 NOTE — Patient Instructions (Signed)
[place] at [facility] Discharge Instructions  Imfinizi today. Follow up as scheduled.   Thank you for choosing [place] at [facility] to provide your oncology and hematology care.  To afford each patient quality time with our provider, please arrive at least 15 minutes before your scheduled appointment time.   If you have a lab appointment with the [place] please come in thru the  Main Entrance and check in at the main information desk  You need to re-schedule your appointment should you arrive 10 or more minutes late.  We strive to give you quality time with our providers, and arriving late affects you and other patients whose appointments are after yours.  Also, if you no show three or more times for appointments you may be dismissed from the clinic at the providers discretion.     Again, thank you for choosing [facility].  Our hope is that these requests will decrease the amount of time that you wait before being seen by our physicians.       _____________________________________________________________  Should you have questions after your visit to [facility], please contact our office at [phone] between the hours of 8:00 a.m. and 4:30 p.m.  Voicemails left after 4:00 p.m. will not be returned until the following business day.  For prescription refill requests, have your pharmacy contact our office and allow 72 hours.    Cancer Center Support Programs:   > Cancer Support Group  2nd Tuesday of the month 1pm-2pm, Journey Room

## 2017-10-04 NOTE — Progress Notes (Signed)
Ian Aguilar,  [id number]   CLINIC:  Medical Oncology/Hematology  PCP:  Sharilyn Sites, MD [address] [place] [address] [phone]   REASON FOR VISIT:  Follow-up for stage III lung cancer  CURRENT THERAPY: Imfinzi  BRIEF ONCOLOGIC HISTORY:    Carcinoma, lung ([place])   06/05/2017 Initial Diagnosis    Carcinoma, lung ([place])    06/05/2017 -  Chemotherapy    The patient had palonosetron (ALOXI) injection 0.25 mg, 0.25 mg, Intravenous,  Once, 6 of 6 cycles Administration: 0.25 mg (06/11/2017), 0.25 mg (07/09/2017), 0.25 mg (07/16/2017), 0.25 mg (06/18/2017), 0.25 mg (06/25/2017), 0.25 mg (07/02/2017) CARBOplatin (PARAPLATIN) 190 mg in sodium chloride 0.9 % 250 mL chemo infusion, 190 mg (100 % of original dose 192.8 mg), Intravenous,  Once, 6 of 6 cycles Dose modification:   (original dose 192.8 mg, Cycle 1),   (original dose 192.8 mg, Cycle 5), 144.6 mg (original dose 144.6 mg, Cycle 6),   (original dose 192.8 mg, Cycle 2),   (original dose 192.8 mg, Cycle 3),   (original dose 192.8 mg, Cycle 4) Administration: 190 mg (06/11/2017), 190 mg (07/09/2017), 140 mg (07/16/2017), 190 mg (06/18/2017), 190 mg (06/25/2017), 190 mg (07/02/2017) PACLitaxel (TAXOL) 90 mg in dextrose 5 % 250 mL chemo infusion (</= 88m/m2), 45 mg/m2 = 90 mg, Intravenous,  Once, 6 of 6 cycles Dose modification: 40.5 mg/m2 (90 % of original dose 45 mg/m2, Cycle 5, Reason: Other (see comments), Comment: early neuropathy), 36 mg/m2 (80 % of original dose 45 mg/m2, Cycle 6, Reason: Provider Judgment) Administration: 90 mg (06/11/2017), 78 mg (07/09/2017), 72 mg (07/16/2017), 90 mg (06/18/2017), 78 mg (06/25/2017), 78 mg (07/02/2017)  for chemotherapy treatment.      Malignant neoplasm of bronchus and lung ([place]   06/08/2017 Initial Diagnosis    Malignant neoplasm of bronchus and lung ([place]    09/19/2017 -  Chemotherapy    The patient had durvalumab (IMFINZI) 860 mg in sodium chloride 0.9 %  100 mL chemo infusion, 800 mg, Intravenous,  Once, 2 of 14 cycles Administration: 860 mg (09/20/2017), 860 mg (10/04/2017)  for chemotherapy treatment.       CANCER STAGING: Cancer Staging No matching staging information was found for the patient.   INTERVAL HISTORY:  [name] male returns for routine follow-up for stage III lung cancer and consideration for next dose of Imfinzi. Patient is here today with his wife. He is doing well with treatment. He has occasional numbness in his feet. He has a cough and slight congestion in his throat  However he is able to cough it up and clear his throat with ease. Overall he feels good about this treatment and wishes to drive himself here. Patient lives at home with his wife and performs all his own ADLs and activities. He denies any nausea, vomiting, or diarrhea at this time. Denies any new pain. Denies any SOB.Patient appetite is at 100% and his energy level is at 75%. Overall he is ready for his next dose.     REVIEW OF SYSTEMS:  Review of Systems  All other systems reviewed and are negative.    PAST MEDICAL/SURGICAL HISTORY:  Past Medical History:  Diagnosis Date  . Anxiety   . Arthritis   . Cirrhosis ([place]    confirmed by MRI on 07/04/11.  no immunizations  . COPD (chronic obstructive pulmonary disease) ([place]   . Depression with anxiety   . ETOH abuse   .  Hyperlipidemia   . Nodule of upper lobe of right lung    with mediastinal adenopathy  . Wears dentures   . Wears glasses    Past Surgical History:  Procedure Laterality Date  . bilateral cataract surgery     [place]  . broken arm  6 yrs ago   left otif of wrist-[place]  . CLOSED REDUCTION WRIST FRACTURE Right 09/02/2015   Procedure: RIGHT WRIST REDUCTION;  Surgeon: Renette Butters, MD;  Location: [facility];  Service: Orthopedics;  Laterality: Right;  with MAC  . COLONOSCOPY  1996   [place]: external hemorrhoids, no polyps  . COLONOSCOPY  06/28/2011   Dr. Ala Bent  diverticulosis,tubular adenoma, hyperplastic polyp  . COLONOSCOPY WITH PROPOFOL N/A 04/26/2017   Dr. Gala Romney: perianal and digital rectal examinations normal, diffusely congested colonic mucosa but otherwise normal  . ESOPHAGOGASTRODUODENOSCOPY  06/28/2011   Dr. Chelsea Aus erosive reflux esophagitis, hiatal hernia-gastritis  . ESOPHAGOGASTRODUODENOSCOPY (EGD) WITH PROPOFOL N/A 04/26/2017   Dr. Gala Romney: normal esophagus, small hiatal hernia, GAVE, portal hypertensive gastropathy, normal duodenal bulb and second portion, no specimens collected. 2 years screening   . EXTERNAL EAR SURGERY     right ear-cleaned out ear and created new eardrum  . INGUINAL HERNIA REPAIR  2-3 yrs ago   left-[place]_  . INTRAMEDULLARY (IM) NAIL INTERTROCHANTERIC Right 09/02/2015   Procedure: RIGHT  INTERTROCHANTRIC HIP;  Surgeon: Renette Butters, MD;  Location: [facility];  Service: Orthopedics;  Laterality: Right;  With MAC  . KNEE SURGERY     left-arthroscopy-[place]  . PORTACATH PLACEMENT Left 06/08/2017   Procedure: INSERTION POWER PORT WITH  ATTACHED CATHETER LEFT SUBCLAVIAN;  Surgeon: Aviva Signs, MD;  Location: [facility];  Service: General;  Laterality: Left;  . SKULL FRACTURE ELEVATION     fractured cheeck bone repaired  . TOTAL HIP ARTHROPLASTY  12/18/2011   Procedure: TOTAL HIP ARTHROPLASTY;  Surgeon: Carole Civil, MD;  Location: [facility];  Service: Orthopedics;  Laterality: Left;  Marland Kitchen VIDEO BRONCHOSCOPY WITH ENDOBRONCHIAL ULTRASOUND N/A 05/04/2017   Procedure: VIDEO BRONCHOSCOPY WITH ENDOBRONCHIAL ULTRASOUND;  Surgeon: Melrose Nakayama, MD;  Location: [facility];  Service: Thoracic;  Laterality: N/A;  . WISDOM TOOTH EXTRACTION       SOCIAL HISTORY:  Social History   Socioeconomic History  . Marital status: Married    Spouse name: Not on file  . Number of children: Not on file  . Years of education: Not on file  . Highest education level: Not on file  Occupational History  . Occupation: retired     Fish farm manager: [employer]  Social Needs  . Financial resource strain: Not on file  . Food insecurity:    Worry: Not on file    Inability: Not on file  . Transportation needs:    Medical: Not on file    Non-medical: Not on file  Tobacco Use  . Smoking status: Current Every Day Smoker    Packs/day: 0.50    Years: 62.00    Pack years: 31.00    Types: Cigarettes  . Smokeless tobacco: Never Used  . Tobacco comment: 1/2 ppd > 60 years as of 06/27/17: 7 cigarettes or less a day  Substance and Sexual Activity  . Alcohol use: Not Currently    Comment: history of ETOH abuse, stopped 1.5 years ago (had one drink at [place] [employer]).   . Drug use: No  . Sexual activity: Yes    Birth control/protection: None  Lifestyle  . Physical activity:  Days per week: Not on file    Minutes per session: Not on file  . Stress: Not on file  Relationships  . Social connections:    Talks on phone: Not on file    Gets together: Not on file    Attends religious service: Not on file    Active member of club or organization: Not on file    Attends meetings of clubs or organizations: Not on file    Relationship status: Not on file  . Intimate partner violence:    Fear of current or ex partner: Not on file    Emotionally abused: Not on file    Physically abused: Not on file    Forced sexual activity: Not on file  Other Topics Concern  . Not on file  Social History Narrative  . Not on file    FAMILY HISTORY:  Family History  Problem Relation Age of Onset  . Stroke Mother 27  . Heart disease Father 68       MI  . Diabetes Maternal Uncle   . Colon cancer Neg Hx   . Cancer Neg Hx     CURRENT MEDICATIONS:  Outpatient Encounter Medications as of 10/04/2017  Medication Sig Note  . cholecalciferol (VITAMIN D) 1000 units tablet Take 1,000 Units by mouth daily.   [place] Oris (IMFINZI IV) Inject into the vein.   Marland Kitchen lidocaine-prilocaine (EMLA) cream Apply a quarter size amount to affected area 1  hour prior to coming to chemotherapy.  Do not rub in.  Cover with plastic wrap. (Patient not taking: Reported on 09/18/2017) 05/31/2017: Not started therapy course yet  . Nutritional Supplements (ENSURE ENLIVE PO) Take 237 mLs by mouth. 2-3/day   . ondansetron (ZOFRAN) 8 MG tablet Take 1 tablet (8 mg total) by mouth every 8 (eight) hours as needed for nausea or vomiting. (Patient not taking: Reported on 09/18/2017) 05/31/2017: Not started therapy course yet   . PROAIR HFA 108 (90 Base) MCG/ACT inhaler Inhale 2 puffs into the lungs every 6 (six) hours as needed (for shortness of breath/wheezing/cough).    . Propylene Glycol-Glycerin (SOOTHE) 0.6-0.6 % SOLN Place 1-2 drops into both eyes 3 (three) times daily as needed (for dry/irritated eyes.).   Marland Kitchen Skin Protectants, Misc. (EUCERIN) cream Apply 1 application topically 4 (four) times daily as needed for dry skin (for itchy or dry skin).   . Tiotropium Bromide Monohydrate (SPIRIVA RESPIMAT) 2.5 MCG/ACT AERS Inhale 2 puffs into the lungs every evening.    . [EXPIRED] 0.9 %  sodium chloride infusion    . [EXPIRED] heparin lock flush 100 unit/mL    . [DISCONTINUED] sodium chloride flush (NS) 0.9 % injection 10 mL     No facility-administered encounter medications on file as of 10/04/2017.     ALLERGIES:  No Known Allergies   PHYSICAL EXAM:  ECOG Performance status: 1  Vitals:   10/04/17 0850  BP: (!) 137/59  Pulse: 89  Resp: 16  Temp: 97.9 F (36.6 C)  SpO2: 94%   Filed Weights   10/04/17 0850  Weight: 177 lb (80.3 kg)    Physical Exam  Constitutional: He is oriented to person, place, and time. He appears well-developed and well-nourished.  Cardiovascular: Normal rate, regular rhythm and normal heart sounds.  Pulmonary/Chest: Effort normal and breath sounds normal.  Neurological: He is alert and oriented to person, place, and time.  Skin: Skin is warm and dry.     LABORATORY DATA:  I have reviewed the  labs as listed.  CBC      Component Value Date/Time   WBC 5.3 10/04/2017 0803   RBC 4.19 (L) 10/04/2017 0803   HGB 14.1 10/04/2017 0803   HCT 42.6 10/04/2017 0803   PLT 146 (L) 10/04/2017 0803   MCV 101.7 (H) 10/04/2017 0803   MCH 33.7 10/04/2017 0803   MCHC 33.1 10/04/2017 0803   RDW 14.0 10/04/2017 0803   LYMPHSABS 0.9 10/04/2017 0803   MONOABS 0.5 10/04/2017 0803   EOSABS 0.2 10/04/2017 0803   BASOSABS 0.0 10/04/2017 0803   CMP Latest Ref Rng & Units 10/04/2017 09/20/2017 09/07/2017  Glucose 70 - 99 mg/dL 102(H) 85 100(H)  BUN 8 - 23 mg/dL _0 Creatinine 0.61 - 1.24 mg/dL 0.87 0.93 0.92  Sodium 135 - 145 mmol/L 139 140 139  Potassium 3.5 - 5.1 mmol/L 3.9 3.7 4.0  Chloride 98 - 111 mmol/L 102 101 101  CO2 22 - 32 mmol/L 30 32 30  Calcium 8.9 - 10.3 mg/dL 9.1 9.2 9.1  Total Protein 6.5 - 8.1 g/dL 7.2 7.4 7.5  Total Bilirubin 0.3 - 1.2 mg/dL 0.9 1.0 0.9  Alkaline Phos 38 - 126 U/L 75 78 72  AST 15 - 41 U/L _1 ALT 0 - 44 U/L _2 ASSESSMENT & PLAN:   Malignant neoplasm of bronchus and lung (HCC) 1.  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 -Chemoradiation therapy with carboplatin and paclitaxel weekly from 06/11/2017 through 07/16/2017. -CT scan of the chest dated 09/07/2017 showed 10 x 7 mm right upper lobe nodule which has not substantially changed in the interval (previously measured 10 x 8 mm).  Right paratracheal adenopathy is no longer visible.  He got partial response and was recommended immunotherapy with Durvalumab every 2 weeks for 26 treatments based on excellent results from PACIFIC trial. -He started on Durvalumab on 09/20/2017.  He tolerated his first dose very well.  We have again talked about immunotherapy related side effects  including pneumonitis, colitis. -I will see him back in 8 weeks for follow-up.  We plan to repeat CT scans of the chest with contrast, 3 months from the last scan.  2.  Neuropathy: He has occasional numbness in the feet.  His paclitaxel dose was reduced by 10% during week 3.      Orders placed this encounter:  Orders Placed This Encounter  Procedures  . CT Chest W Contrast  . CT Abdomen Pelvis W Contrast  . CBC with Differential/Platelet  . Comprehensive metabolic panel  . CBC with Differential/Platelet  . Comprehensive metabolic panel  . TSH      Derek Jack, [place] [phone]

## 2017-10-04 NOTE — Assessment & Plan Note (Signed)
1.  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 -Chemoradiation therapy with carboplatin and paclitaxel weekly from 06/11/2017 through 07/16/2017. -CT scan of the chest dated 09/07/2017 showed 10 x 7 mm right upper lobe nodule which has not substantially changed in the interval (previously measured 10 x 8 mm).  Right paratracheal adenopathy is no longer visible.  He got partial response and was recommended immunotherapy with Durvalumab every 2 weeks for 26 treatments based on excellent results from PACIFIC trial. -He started on Durvalumab on 09/20/2017.  He tolerated his first dose very well.  We have again talked about immunotherapy related side effects including pneumonitis, colitis. -I will see him back in 8 weeks for follow-up.  We plan to repeat CT scans of the chest with contrast, 3 months from the last scan.  2.  Neuropathy: He has occasional numbness in the feet.  His paclitaxel dose was reduced by 10% during week 3.

## 2017-10-04 NOTE — Patient Instructions (Signed)
[place] at [facility] Discharge Instructions  Follow up in 8 weeks to see the doctor with labs prior. Continue your treatment every 2 weeks with labs.    Thank you for choosing [place] at [facility] to provide your oncology and hematology care.  To afford each patient quality time with our provider, please arrive at least 15 minutes before your scheduled appointment time.   If you have a lab appointment with the [facility] please come in thru the  Main Entrance and check in at the main information desk  You need to re-schedule your appointment should you arrive 10 or more minutes late.  We strive to give you quality time with our providers, and arriving late affects you and other patients whose appointments are after yours.  Also, if you no show three or more times for appointments you may be dismissed from the clinic at the providers discretion.     Again, thank you for choosing [facility].  Our hope is that these requests will decrease the amount of time that you wait before being seen by our physicians.       _____________________________________________________________  Should you have questions after your visit to [facility], please contact our office at [phone] between the hours of 8:00 a.m. and 4:30 p.m.  Voicemails left after 4:00 p.m. will not be returned until the following business day.  For prescription refill requests, have your pharmacy contact our office and allow 72 hours.    Cancer Center Support Programs:   > Cancer Support Group  2nd Tuesday of the month 1pm-2pm, Journey Room

## 2017-10-17 ENCOUNTER — Inpatient Hospital Stay (HOSPITAL_COMMUNITY): Payer: PPO

## 2017-10-17 ENCOUNTER — Other Ambulatory Visit (HOSPITAL_COMMUNITY)
Admission: RE | Admit: 2017-10-17 | Discharge: 2017-10-17 | Disposition: A | Discharge status: Home / Self Care | Payer: PPO | Source: Ambulatory Visit | Attending: Hematology | Admitting: Hematology

## 2017-10-17 DIAGNOSIS — C3490 Malignant neoplasm of unspecified part of unspecified bronchus or lung: Secondary | ICD-10-CM

## 2017-10-17 DIAGNOSIS — C3491 Malignant neoplasm of unspecified part of right bronchus or lung: Secondary | ICD-10-CM | POA: Insufficient documentation

## 2017-10-17 DIAGNOSIS — Z5112 Encounter for antineoplastic immunotherapy: Secondary | ICD-10-CM | POA: Diagnosis not present

## 2017-10-17 LAB — CBC WITH DIFFERENTIAL/PLATELET
Basophils Absolute: 0 10*3/uL (ref 0.0–0.1)
Basophils Relative: 0 %
Eosinophils Absolute: 0.1 10*3/uL (ref 0.0–0.7)
Eosinophils Relative: 2 %
HCT: 40.1 % (ref 39.0–52.0)
Hemoglobin: 13.4 g/dL (ref 13.0–17.0)
Lymphocytes Relative: 14 %
Lymphs Abs: 0.8 10*3/uL (ref 0.7–4.0)
MCH: 32.7 pg (ref 26.0–34.0)
MCHC: 33.4 g/dL (ref 30.0–36.0)
MCV: 97.8 fL (ref 78.0–100.0)
Monocytes Absolute: 0.6 10*3/uL (ref 0.1–1.0)
Monocytes Relative: 10 %
Neutro Abs: 4 10*3/uL (ref 1.7–7.7)
Neutrophils Relative %: 74 %
Platelets: 144 10*3/uL — ABNORMAL LOW (ref 150–400)
RBC: 4.1 MIL/uL — ABNORMAL LOW (ref 4.22–5.81)
RDW: 13.3 % (ref 11.5–15.5)
WBC: 5.5 10*3/uL (ref 4.0–10.5)

## 2017-10-17 LAB — COMPREHENSIVE METABOLIC PANEL
ALT: 17 U/L (ref 0–44)
AST: 22 U/L (ref 15–41)
Albumin: 3.8 g/dL (ref 3.5–5.0)
Alkaline Phosphatase: 81 U/L (ref 38–126)
Anion gap: 7 (ref 5–15)
BUN: 23 mg/dL (ref 8–23)
CO2: 29 mmol/L (ref 22–32)
Calcium: 9.2 mg/dL (ref 8.9–10.3)
Chloride: 106 mmol/L (ref 98–111)
Creatinine, Ser: 0.81 mg/dL (ref 0.61–1.24)
GFR calc Af Amer: >60 mL/min (ref >60)
GFR calc non Af Amer: >60 mL/min (ref >60)
Glucose, Bld: 115 mg/dL — ABNORMAL HIGH (ref 70–99)
Potassium: 3.8 mmol/L (ref 3.5–5.1)
Sodium: 142 mmol/L (ref 135–145)
Total Bilirubin: 0.7 mg/dL (ref 0.3–1.2)
Total Protein: 7.3 g/dL (ref 6.5–8.1)

## 2017-10-18 ENCOUNTER — Ambulatory Visit (HOSPITAL_COMMUNITY): Payer: PPO

## 2017-10-18 ENCOUNTER — Other Ambulatory Visit (HOSPITAL_COMMUNITY): Payer: PPO

## 2017-10-18 ENCOUNTER — Inpatient Hospital Stay (HOSPITAL_COMMUNITY): Payer: PPO

## 2017-10-18 ENCOUNTER — Other Ambulatory Visit: Payer: Self-pay

## 2017-10-18 ENCOUNTER — Encounter (HOSPITAL_COMMUNITY): Payer: Self-pay

## 2017-10-18 VITALS — BP 153/59 | HR 64 | Temp 97.7°F | Resp 18 | Wt 173.6 lb

## 2017-10-18 DIAGNOSIS — Z5112 Encounter for antineoplastic immunotherapy: Secondary | ICD-10-CM | POA: Diagnosis not present

## 2017-10-18 DIAGNOSIS — C3490 Malignant neoplasm of unspecified part of unspecified bronchus or lung: Secondary | ICD-10-CM

## 2017-10-18 MED ORDER — HEPARIN SOD (PORK) LOCK FLUSH 100 UNIT/ML IV SOLN
500.0000 [IU] | Freq: Once | INTRAVENOUS | Status: AC | PRN
  Administered 2017-10-18: 500 [IU]

## 2017-10-18 MED ORDER — SODIUM CHLORIDE 0.9 % IV SOLN
860.0000 mg | Freq: Once | INTRAVENOUS | Status: AC
  Administered 2017-10-18: 860 mg via INTRAVENOUS
  Filled 2017-10-18: qty 7.2

## 2017-10-18 MED ORDER — SODIUM CHLORIDE 0.9 % IV SOLN
Freq: Once | INTRAVENOUS | Status: AC
  Administered 2017-10-18: 10:00:00 via INTRAVENOUS

## 2017-10-18 NOTE — Progress Notes (Signed)
Tolerated infusion w/o adverse reaction.  Alert, in no distress.  VSS.  Discharged ambulatory in c/o spouse.  

## 2017-10-24 DIAGNOSIS — C3490 Malignant neoplasm of unspecified part of unspecified bronchus or lung: Secondary | ICD-10-CM | POA: Diagnosis not present

## 2017-10-24 DIAGNOSIS — C3411 Malignant neoplasm of upper lobe, right bronchus or lung: Secondary | ICD-10-CM | POA: Diagnosis not present

## 2017-10-31 ENCOUNTER — Inpatient Hospital Stay (HOSPITAL_COMMUNITY): Payer: PPO | Attending: Hematology

## 2017-10-31 DIAGNOSIS — C3411 Malignant neoplasm of upper lobe, right bronchus or lung: Secondary | ICD-10-CM | POA: Diagnosis not present

## 2017-10-31 DIAGNOSIS — C3490 Malignant neoplasm of unspecified part of unspecified bronchus or lung: Secondary | ICD-10-CM

## 2017-10-31 DIAGNOSIS — Z5112 Encounter for antineoplastic immunotherapy: Secondary | ICD-10-CM | POA: Diagnosis not present

## 2017-10-31 LAB — CBC WITH DIFFERENTIAL/PLATELET
Basophils Absolute: 0 10*3/uL (ref 0.0–0.1)
Basophils Relative: 0 %
Eosinophils Absolute: 0.2 10*3/uL (ref 0.0–0.7)
Eosinophils Relative: 3 %
HCT: 45.3 % (ref 39.0–52.0)
Hemoglobin: 15.2 g/dL (ref 13.0–17.0)
Lymphocytes Relative: 19 %
Lymphs Abs: 1.1 10*3/uL (ref 0.7–4.0)
MCH: 32.5 pg (ref 26.0–34.0)
MCHC: 33.6 g/dL (ref 30.0–36.0)
MCV: 96.8 fL (ref 78.0–100.0)
Monocytes Absolute: 0.4 10*3/uL (ref 0.1–1.0)
Monocytes Relative: 7 %
Neutro Abs: 4.2 10*3/uL (ref 1.7–7.7)
Neutrophils Relative %: 71 %
Platelets: 180 10*3/uL (ref 150–400)
RBC: 4.68 MIL/uL (ref 4.22–5.81)
RDW: 13.7 % (ref 11.5–15.5)
WBC: 5.9 10*3/uL (ref 4.0–10.5)

## 2017-10-31 LAB — COMPREHENSIVE METABOLIC PANEL
ALT: 17 U/L (ref 0–44)
AST: 26 U/L (ref 15–41)
Albumin: 4.1 g/dL (ref 3.5–5.0)
Alkaline Phosphatase: 78 U/L (ref 38–126)
Anion gap: 8 (ref 5–15)
BUN: 13 mg/dL (ref 8–23)
CO2: 31 mmol/L (ref 22–32)
Calcium: 9.3 mg/dL (ref 8.9–10.3)
Chloride: 102 mmol/L (ref 98–111)
Creatinine, Ser: 0.95 mg/dL (ref 0.61–1.24)
GFR calc Af Amer: >60 mL/min (ref >60)
GFR calc non Af Amer: >60 mL/min (ref >60)
Glucose, Bld: 94 mg/dL (ref 70–99)
Potassium: 4.2 mmol/L (ref 3.5–5.1)
Sodium: 141 mmol/L (ref 135–145)
Total Bilirubin: 0.7 mg/dL (ref 0.3–1.2)
Total Protein: 7.4 g/dL (ref 6.5–8.1)

## 2017-11-01 ENCOUNTER — Encounter (HOSPITAL_COMMUNITY): Payer: Self-pay

## 2017-11-01 ENCOUNTER — Ambulatory Visit (HOSPITAL_COMMUNITY): Payer: PPO

## 2017-11-01 ENCOUNTER — Inpatient Hospital Stay (HOSPITAL_COMMUNITY): Payer: PPO

## 2017-11-01 ENCOUNTER — Other Ambulatory Visit (HOSPITAL_COMMUNITY): Payer: PPO

## 2017-11-01 VITALS — BP 143/64 | HR 63 | Temp 98.0°F | Resp 18 | Wt 174.2 lb

## 2017-11-01 DIAGNOSIS — C3490 Malignant neoplasm of unspecified part of unspecified bronchus or lung: Secondary | ICD-10-CM

## 2017-11-01 DIAGNOSIS — Z5112 Encounter for antineoplastic immunotherapy: Secondary | ICD-10-CM | POA: Diagnosis not present

## 2017-11-01 MED ORDER — SODIUM CHLORIDE 0.9 % IV SOLN
Freq: Once | INTRAVENOUS | Status: AC
  Administered 2017-11-01: 13:00:00 via INTRAVENOUS

## 2017-11-01 MED ORDER — SODIUM CHLORIDE 0.9% FLUSH
10.0000 mL | INTRAVENOUS | Status: DC | PRN
  Administered 2017-11-01: 10 mL
  Filled 2017-11-01: qty 10

## 2017-11-01 MED ORDER — HEPARIN SOD (PORK) LOCK FLUSH 100 UNIT/ML IV SOLN
500.0000 [IU] | Freq: Once | INTRAVENOUS | Status: AC | PRN
  Administered 2017-11-01: 500 [IU]

## 2017-11-01 MED ORDER — SODIUM CHLORIDE 0.9 % IV SOLN
10.6000 mg/kg | Freq: Once | INTRAVENOUS | Status: AC
  Administered 2017-11-01: 860 mg via INTRAVENOUS
  Filled 2017-11-01: qty 7.2

## 2017-11-01 NOTE — Patient Instructions (Signed)
[place] Discharge Instructions for Patients Receiving Chemotherapy  Today you received the following chemotherapy agents imfinzi.    If you develop nausea and vomiting that is not controlled by your nausea medication, call the clinic.   BELOW ARE SYMPTOMS THAT SHOULD BE REPORTED IMMEDIATELY:  *FEVER GREATER THAN 100.5 F  *CHILLS WITH OR WITHOUT FEVER  NAUSEA AND VOMITING THAT IS NOT CONTROLLED WITH YOUR NAUSEA MEDICATION  *UNUSUAL SHORTNESS OF BREATH  *UNUSUAL BRUISING OR BLEEDING  TENDERNESS IN MOUTH AND THROAT WITH OR WITHOUT PRESENCE OF ULCERS  *URINARY PROBLEMS  *BOWEL PROBLEMS  UNUSUAL RASH Items with * indicate a potential emergency and should be followed up as soon as possible.  Feel free to call the clinic should you have any questions or concerns. The clinic phone number is [phone].  Please show the [place] at check-in to the Emergency Department and triage nurse.

## 2017-11-01 NOTE — Progress Notes (Signed)
Patient tolerated chemotherapy with no complaints voiced.  Port site clean and dry with no bruising or swelling noted at site.  Good blood return noted before and after administration of therapy.  Site clean and dry.  Band aid applied.  VSS with discharge and left ambulatory with no s/s of distress noted.

## 2017-11-14 ENCOUNTER — Inpatient Hospital Stay (HOSPITAL_COMMUNITY): Payer: PPO

## 2017-11-14 DIAGNOSIS — C3490 Malignant neoplasm of unspecified part of unspecified bronchus or lung: Secondary | ICD-10-CM

## 2017-11-14 DIAGNOSIS — Z5112 Encounter for antineoplastic immunotherapy: Secondary | ICD-10-CM | POA: Diagnosis not present

## 2017-11-14 LAB — CBC WITH DIFFERENTIAL/PLATELET
Basophils Absolute: 0 10*3/uL (ref 0.0–0.1)
Basophils Relative: 0 %
Eosinophils Absolute: 0.2 10*3/uL (ref 0.0–0.7)
Eosinophils Relative: 3 %
HCT: 46.7 % (ref 39.0–52.0)
Hemoglobin: 15.4 g/dL (ref 13.0–17.0)
Lymphocytes Relative: 17 %
Lymphs Abs: 0.8 10*3/uL (ref 0.7–4.0)
MCH: 32.2 pg (ref 26.0–34.0)
MCHC: 33 g/dL (ref 30.0–36.0)
MCV: 97.5 fL (ref 78.0–100.0)
Monocytes Absolute: 0.4 10*3/uL (ref 0.1–1.0)
Monocytes Relative: 7 %
Neutro Abs: 3.5 10*3/uL (ref 1.7–7.7)
Neutrophils Relative %: 73 %
Platelets: 129 10*3/uL — ABNORMAL LOW (ref 150–400)
RBC: 4.79 MIL/uL (ref 4.22–5.81)
RDW: 14.1 % (ref 11.5–15.5)
WBC: 4.9 10*3/uL (ref 4.0–10.5)

## 2017-11-14 LAB — COMPREHENSIVE METABOLIC PANEL
ALT: 17 U/L (ref 0–44)
AST: 29 U/L (ref 15–41)
Albumin: 4.1 g/dL (ref 3.5–5.0)
Alkaline Phosphatase: 78 U/L (ref 38–126)
Anion gap: 9 (ref 5–15)
BUN: 17 mg/dL (ref 8–23)
CO2: 32 mmol/L (ref 22–32)
Calcium: 9.3 mg/dL (ref 8.9–10.3)
Chloride: 98 mmol/L (ref 98–111)
Creatinine, Ser: 1.01 mg/dL (ref 0.61–1.24)
GFR calc Af Amer: >60 mL/min (ref >60)
GFR calc non Af Amer: >60 mL/min (ref >60)
Glucose, Bld: 133 mg/dL — ABNORMAL HIGH (ref 70–99)
Potassium: 4.2 mmol/L (ref 3.5–5.1)
Sodium: 139 mmol/L (ref 135–145)
Total Bilirubin: 0.6 mg/dL (ref 0.3–1.2)
Total Protein: 7.5 g/dL (ref 6.5–8.1)

## 2017-11-15 ENCOUNTER — Ambulatory Visit (HOSPITAL_COMMUNITY): Payer: PPO

## 2017-11-15 ENCOUNTER — Other Ambulatory Visit (HOSPITAL_COMMUNITY): Payer: PPO

## 2017-11-15 ENCOUNTER — Inpatient Hospital Stay (HOSPITAL_COMMUNITY): Payer: PPO

## 2017-11-15 VITALS — BP 169/75 | HR 54 | Temp 97.5°F | Resp 18 | Wt 175.8 lb

## 2017-11-15 DIAGNOSIS — Z5112 Encounter for antineoplastic immunotherapy: Secondary | ICD-10-CM | POA: Diagnosis not present

## 2017-11-15 DIAGNOSIS — C3490 Malignant neoplasm of unspecified part of unspecified bronchus or lung: Secondary | ICD-10-CM

## 2017-11-15 MED ORDER — SODIUM CHLORIDE 0.9 % IV SOLN
860.0000 mg | Freq: Once | INTRAVENOUS | Status: AC
  Administered 2017-11-15: 860 mg via INTRAVENOUS
  Filled 2017-11-15: qty 7.2

## 2017-11-15 MED ORDER — SODIUM CHLORIDE 0.9 % IV SOLN
Freq: Once | INTRAVENOUS | Status: AC
  Administered 2017-11-15: 12:00:00 via INTRAVENOUS

## 2017-11-15 MED ORDER — HEPARIN SOD (PORK) LOCK FLUSH 100 UNIT/ML IV SOLN
500.0000 [IU] | Freq: Once | INTRAVENOUS | Status: AC | PRN
  Administered 2017-11-15: 500 [IU]

## 2017-11-15 NOTE — Progress Notes (Signed)
Tolerated infusion w/o adverse reaction.  Alert, in no distress.  VSS.  Discharged ambulatory.  

## 2017-11-28 ENCOUNTER — Inpatient Hospital Stay (HOSPITAL_COMMUNITY): Payer: PPO | Attending: Hematology

## 2017-11-28 DIAGNOSIS — C3490 Malignant neoplasm of unspecified part of unspecified bronchus or lung: Secondary | ICD-10-CM

## 2017-11-28 DIAGNOSIS — C3411 Malignant neoplasm of upper lobe, right bronchus or lung: Secondary | ICD-10-CM | POA: Insufficient documentation

## 2017-11-28 DIAGNOSIS — Z79899 Other long term (current) drug therapy: Secondary | ICD-10-CM | POA: Diagnosis not present

## 2017-11-28 DIAGNOSIS — Z5112 Encounter for antineoplastic immunotherapy: Secondary | ICD-10-CM | POA: Insufficient documentation

## 2017-11-28 LAB — COMPREHENSIVE METABOLIC PANEL
ALT: 18 U/L (ref 0–44)
AST: 35 U/L (ref 15–41)
Albumin: 4.1 g/dL (ref 3.5–5.0)
Alkaline Phosphatase: 74 U/L (ref 38–126)
Anion gap: 12 (ref 5–15)
BUN: 18 mg/dL (ref 8–23)
CO2: 30 mmol/L (ref 22–32)
Calcium: 9.2 mg/dL (ref 8.9–10.3)
Chloride: 96 mmol/L — ABNORMAL LOW (ref 98–111)
Creatinine, Ser: 1.11 mg/dL (ref 0.61–1.24)
GFR calc Af Amer: >60 mL/min (ref >60)
GFR calc non Af Amer: >60 mL/min (ref >60)
Glucose, Bld: 121 mg/dL — ABNORMAL HIGH (ref 70–99)
Potassium: 3.9 mmol/L (ref 3.5–5.1)
Sodium: 138 mmol/L (ref 135–145)
Total Bilirubin: 0.5 mg/dL (ref 0.3–1.2)
Total Protein: 7.6 g/dL (ref 6.5–8.1)

## 2017-11-28 LAB — CBC WITH DIFFERENTIAL/PLATELET
Basophils Absolute: 0 10*3/uL (ref 0.0–0.1)
Basophils Relative: 0 %
Eosinophils Absolute: 0.2 10*3/uL (ref 0.0–0.7)
Eosinophils Relative: 4 %
HCT: 43.1 % (ref 39.0–52.0)
Hemoglobin: 14.7 g/dL (ref 13.0–17.0)
Lymphocytes Relative: 17 %
Lymphs Abs: 0.7 10*3/uL (ref 0.7–4.0)
MCH: 32 pg (ref 26.0–34.0)
MCHC: 34.1 g/dL (ref 30.0–36.0)
MCV: 93.9 fL (ref 78.0–100.0)
Monocytes Absolute: 0.4 10*3/uL (ref 0.1–1.0)
Monocytes Relative: 10 %
Neutro Abs: 3 10*3/uL (ref 1.7–7.7)
Neutrophils Relative %: 69 %
Platelets: 151 10*3/uL (ref 150–400)
RBC: 4.59 MIL/uL (ref 4.22–5.81)
RDW: 15.2 % (ref 11.5–15.5)
WBC: 4.3 10*3/uL (ref 4.0–10.5)

## 2017-11-28 LAB — TSH: TSH: 190.28 u[IU]/mL — ABNORMAL HIGH (ref 0.350–4.500)

## 2017-11-29 ENCOUNTER — Other Ambulatory Visit (HOSPITAL_COMMUNITY): Payer: PPO

## 2017-11-29 ENCOUNTER — Other Ambulatory Visit: Payer: Self-pay

## 2017-11-29 ENCOUNTER — Inpatient Hospital Stay (HOSPITAL_COMMUNITY): Payer: PPO

## 2017-11-29 ENCOUNTER — Inpatient Hospital Stay (HOSPITAL_BASED_OUTPATIENT_CLINIC_OR_DEPARTMENT_OTHER): Payer: PPO | Admitting: Hematology

## 2017-11-29 ENCOUNTER — Encounter (HOSPITAL_COMMUNITY): Payer: Self-pay | Admitting: Hematology

## 2017-11-29 ENCOUNTER — Ambulatory Visit (HOSPITAL_COMMUNITY): Payer: PPO

## 2017-11-29 ENCOUNTER — Ambulatory Visit (HOSPITAL_COMMUNITY): Payer: PPO | Admitting: Hematology

## 2017-11-29 ENCOUNTER — Other Ambulatory Visit (HOSPITAL_COMMUNITY): Payer: Self-pay | Admitting: Nurse Practitioner

## 2017-11-29 VITALS — BP 170/88 | HR 66 | Temp 98.0°F | Resp 20 | Wt 180.0 lb

## 2017-11-29 VITALS — BP 151/67 | HR 52 | Temp 97.7°F | Resp 18

## 2017-11-29 DIAGNOSIS — Z923 Personal history of irradiation: Secondary | ICD-10-CM

## 2017-11-29 DIAGNOSIS — C3490 Malignant neoplasm of unspecified part of unspecified bronchus or lung: Secondary | ICD-10-CM

## 2017-11-29 DIAGNOSIS — Z9221 Personal history of antineoplastic chemotherapy: Secondary | ICD-10-CM | POA: Diagnosis not present

## 2017-11-29 DIAGNOSIS — C3491 Malignant neoplasm of unspecified part of right bronchus or lung: Secondary | ICD-10-CM

## 2017-11-29 DIAGNOSIS — Z5112 Encounter for antineoplastic immunotherapy: Secondary | ICD-10-CM | POA: Diagnosis not present

## 2017-11-29 DIAGNOSIS — C3411 Malignant neoplasm of upper lobe, right bronchus or lung: Secondary | ICD-10-CM

## 2017-11-29 MED ORDER — SODIUM CHLORIDE 0.9% FLUSH
10.0000 mL | INTRAVENOUS | Status: DC | PRN
  Administered 2017-11-29: 10 mL
  Filled 2017-11-29: qty 10

## 2017-11-29 MED ORDER — SODIUM CHLORIDE 0.9 % IV SOLN
860.0000 mg | Freq: Once | INTRAVENOUS | Status: AC
  Administered 2017-11-29: 860 mg via INTRAVENOUS
  Filled 2017-11-29: qty 7.2

## 2017-11-29 MED ORDER — SODIUM CHLORIDE 0.9 % IV SOLN
Freq: Once | INTRAVENOUS | Status: AC
  Administered 2017-11-29: 13:00:00 via INTRAVENOUS

## 2017-11-29 MED ORDER — LEVOTHYROXINE SODIUM 125 MCG PO TABS: 125.0000 ug | ORAL_TABLET | Freq: Every day | ORAL | 1 refills | Status: DC

## 2017-11-29 MED ORDER — HEPARIN SOD (PORK) LOCK FLUSH 100 UNIT/ML IV SOLN
500.0000 [IU] | Freq: Once | INTRAVENOUS | Status: AC | PRN
  Administered 2017-11-29: 500 [IU]

## 2017-11-29 NOTE — Patient Instructions (Signed)
[facility] at [facility] Discharge Instructions  Follow up in 4 weeks with labs    Thank you for choosing [facility] at [facility] to provide your oncology and hematology care.  To afford each patient quality time with our provider, please arrive at least 15 minutes before your scheduled appointment time.   If you have a lab appointment with the Cancer Center please come in thru the  Main Entrance and check in at the main information desk  You need to re-schedule your appointment should you arrive 10 or more minutes late.  We strive to give you quality time with our providers, and arriving late affects you and other patients whose appointments are after yours.  Also, if you no show three or more times for appointments you may be dismissed from the clinic at the providers discretion.     Again, thank you for choosing [facility].  Our hope is that these requests will decrease the amount of time that you wait before being seen by our physicians.       _____________________________________________________________  Should you have questions after your visit to [facility], please contact our office at [phone] between the hours of 8:00 a.m. and 4:30 p.m.  Voicemails left after 4:00 p.m. will not be returned until the following business day.  For prescription refill requests, have your pharmacy contact our office and allow 72 hours.    Cancer Center Support Programs:   > Cancer Support Group  2nd Tuesday of the month 1pm-2pm, Journey Room    

## 2017-11-29 NOTE — Progress Notes (Signed)
Ian Aguilar tolerated Imfinzi infusion well without complaints or incident. Labs reviewed with and pt seen by Dr. Delton Coombes prior to administering this medication. VSS upon discharge. Pt discharged self ambulatory using cane in satisfactory condition

## 2017-11-29 NOTE — Assessment & Plan Note (Signed)
1.  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 -Chemoradiation therapy with carboplatin and paclitaxel weekly from 06/11/2017 through 07/16/2017. -CT scan of the chest dated 09/07/2017 showed 10 x 7 mm right upper lobe nodule which has not substantially changed in the interval (previously measured 10 x 8 mm).  Right paratracheal adenopathy is no longer visible.  He got partial response and was recommended immunotherapy with Durvalumab every 2 weeks for 26 treatments based on excellent results from PACIFIC trial. -Started on Durvalumab on 09/20/2017 and is tolerating very well. - Denies any severe tiredness.  Today his TSH was found to be elevated at 190.  Prior to start of therapy, it was normal.  We will start him on Synthroid 125 mcg daily and recheck his TSH.  He will continue durvalumab and will see me in 4 weeks.  2.  Neuropathy: He reports that he has numbness in both of his legs started couple of weeks ago.  This numbness is on and off.  He had some numbness on and off when he was getting paclitaxel also.

## 2017-11-29 NOTE — Progress Notes (Signed)
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  CURRENT THERAPY: Imfinzi  BRIEF ONCOLOGIC HISTORY:    Carcinoma, lung ([place])   06/05/2017 Initial Diagnosis    Carcinoma, lung ([place])    06/05/2017 - 07/18/2017 Chemotherapy    The patient had palonosetron (ALOXI) injection 0.25 mg, 0.25 mg, Intravenous,  Once, 6 of 6 cycles Administration: 0.25 mg (06/11/2017), 0.25 mg (07/09/2017), 0.25 mg (07/16/2017), 0.25 mg (06/18/2017), 0.25 mg (06/25/2017), 0.25 mg (07/02/2017) CARBOplatin (PARAPLATIN) 190 mg in sodium chloride 0.9 % 250 mL chemo infusion, 190 mg (100 % of original dose 192.8 mg), Intravenous,  Once, 6 of 6 cycles Dose modification:   (original dose 192.8 mg, Cycle 1),   (original dose 192.8 mg, Cycle 5), 144.6 mg (original dose 144.6 mg, Cycle 6),   (original dose 192.8 mg, Cycle 2),   (original dose 192.8 mg, Cycle 3),   (original dose 192.8 mg, Cycle 4) Administration: 190 mg (06/11/2017), 190 mg (07/09/2017), 140 mg (07/16/2017), 190 mg (06/18/2017), 190 mg (06/25/2017), 190 mg (07/02/2017) PACLitaxel (TAXOL) 90 mg in dextrose 5 % 250 mL chemo infusion (</= 41m/m2), 45 mg/m2 = 90 mg, Intravenous,  Once, 6 of 6 cycles Dose modification: 40.5 mg/m2 (90 % of original dose 45 mg/m2, Cycle 5, Reason: Other (see comments), Comment: early neuropathy), 36 mg/m2 (80 % of original dose 45 mg/m2, Cycle 6, Reason: Provider Judgment) Administration: 90 mg (06/11/2017), 78 mg (07/09/2017), 72 mg (07/16/2017), 90 mg (06/18/2017), 78 mg (06/25/2017), 78 mg (07/02/2017)  for chemotherapy treatment.      Malignant [place] of [place]us and lung ([place]   06/08/2017 Initial Diagnosis    Malignant neoplasm of bronchus and lung ([facility]    09/19/2017 -  Chemotherapy    The patient had durvalumab (IMFINZI) 860 mg in  sodium chloride 0.9 % 100 mL chemo infusion, 800 mg, Intravenous,  Once, 6 of 14 cycles Administration: 860 mg (09/20/2017), 860 mg (10/04/2017), 860 mg (10/18/2017), 860 mg (11/01/2017), 860 mg (11/15/2017)  for chemotherapy treatment.       INTERVAL HISTORY:  [name]. male returns for routine follow-up for stage III non small cell carcinoma. Patient is here today and has increase fatigue. His appetite is 100% and he drinks ensure daily to help maintain his weight. His energy level is 75%. He lives at home alone and performs all his own ADLs. He denies any new pains. Denies any nausea, vomiting, or diarrhea. Denies any headaches or vision changes. Denies any SOB or cough. Denies any fevers or recent infections.     REVIEW OF SYSTEMS:  Review of Systems  Constitutional: Positive for fatigue.  All other systems reviewed and are negative.    PAST MEDICAL/SURGICAL HISTORY:  Past Medical History:  Diagnosis Date  . Anxiety   . Arthritis   . Cirrhosis ([facility]    confirmed by MRI on 07/04/11.  no immunizations  . COPD (chronic obstructive pulmonary disease) ([facility]   . Depression with anxiety   . ETOH abuse   . Hyperlipidemia   . Nodule of upper lobe of right lung    with mediastinal adenopathy  . Wears dentures   . Wears glasses    Past Surgical History:  Procedure Laterality Date  . bilateral cataract surgery     [place]  .  broken arm  6 yrs ago   left otif of wrist-[place]  . CLOSED REDUCTION WRIST FRACTURE Right 09/02/2015   Procedure: RIGHT WRIST REDUCTION;  Surgeon: Ian Butters, MD;  Location: [facility];  Service: Orthopedics;  Laterality: Right;  with MAC  . COLONOSCOPY  1996   Ian Aguilar: external hemorrhoids, no polyps  . COLONOSCOPY  06/28/2011   Dr. Ala Aguilar diverticulosis,tubular adenoma, hyperplastic polyp  . COLONOSCOPY WITH PROPOFOL N/A 04/26/2017   Dr. Gala Aguilar: perianal and digital rectal examinations normal, diffusely congested colonic mucosa but otherwise  normal  . ESOPHAGOGASTRODUODENOSCOPY  06/28/2011   Dr. Chelsea Aguilar erosive reflux esophagitis, hiatal hernia-gastritis  . ESOPHAGOGASTRODUODENOSCOPY (EGD) WITH PROPOFOL N/A 04/26/2017   Dr. Gala Aguilar: normal esophagus, small hiatal hernia, GAVE, portal hypertensive gastropathy, normal duodenal bulb and second portion, no specimens collected. 2 years screening   . EXTERNAL EAR SURGERY     right ear-cleaned out ear and created new eardrum  . INGUINAL HERNIA REPAIR  2-3 yrs ago   left-[facility]  . INTRAMEDULLARY (IM) NAIL INTERTROCHANTERIC Right 09/02/2015   Procedure: RIGHT  INTERTROCHANTRIC HIP;  Surgeon: Ian Butters, MD;  Location: [facility];  Service: Orthopedics;  Laterality: Right;  With MAC  . KNEE SURGERY     left-arthroscopy-[place]  . PORTACATH PLACEMENT Left 06/08/2017   Procedure: INSERTION POWER PORT WITH  ATTACHED CATHETER LEFT SUBCLAVIAN;  Surgeon: Ian Signs, MD;  Location: [facility];  Service: General;  Laterality: Left;  . SKULL FRACTURE ELEVATION     fractured cheeck bone repaired  . TOTAL HIP ARTHROPLASTY  12/18/2011   Procedure: TOTAL HIP ARTHROPLASTY;  Surgeon: Ian Civil, MD;  Location: [facility];  Service: Orthopedics;  Laterality: Left;  Marland Kitchen VIDEO BRONCHOSCOPY WITH ENDOBRONCHIAL ULTRASOUND N/A 05/04/2017   Procedure: VIDEO BRONCHOSCOPY WITH ENDOBRONCHIAL ULTRASOUND;  Surgeon: Ian Nakayama, MD;  Location: [facility];  Service: Thoracic;  Laterality: N/A;  . WISDOM TOOTH EXTRACTION       SOCIAL HISTORY:  Social History   Socioeconomic History  . Marital status: Married    Spouse name: Not on file  . Number of children: Not on file  . Years of education: Not on file  . Highest education level: Not on file  Occupational History  . Occupation: retired    Fish farm manager: [employer]  Social Needs  . Financial resource strain: Not on file  . Food insecurity:    Worry: Not on file    Inability: Not on file  . Transportation needs:    Medical: Not on file     Non-medical: Not on file  Tobacco Use  . Smoking status: Current Every Day Smoker    Packs/day: 0.50    Years: 62.00    Pack years: 31.00    Types: Cigarettes  . Smokeless tobacco: Never Used  . Tobacco comment: 1/2 ppd > 60 years as of 06/27/17: 7 cigarettes or less a day  Substance and Sexual Activity  . Alcohol use: Not Currently    Comment: history of ETOH abuse, stopped 1.5 years ago (had one drink [place] [employer]).   . Drug use: No  . Sexual activity: Yes    Birth control/protection: None  Lifestyle  . Physical activity:    Days per week: Not on file    Minutes per session: Not on file  . Stress: Not on file  Relationships  . Social connections:    Talks on phone: Not on file    Gets together: Not on file  Attends religious service: Not on file    Active member of club or organization: Not on file    Attends meetings of clubs or organizations: Not on file    Relationship status: Not on file  . Intimate partner violence:    Fear of current or ex partner: Not on file    Emotionally abused: Not on file    Physically abused: Not on file    Forced sexual activity: Not on file  Other Topics Concern  . Not on file  Social History Narrative  . Not on file    FAMILY HISTORY:  Family History  Problem Relation Age of Onset  . Stroke Mother 86  . Heart disease Father 49       MI  . Diabetes Maternal Uncle   . Colon cancer Neg Hx   . Cancer Neg Hx     CURRENT MEDICATIONS:  Outpatient Encounter Medications as of 11/29/2017  Medication Sig  . cholecalciferol (VITAMIN D) 1000 units tablet Take 1,000 Units by mouth daily.  [place] Oris (IMFINZI IV) Inject into the vein.  Marland Kitchen lidocaine-prilocaine (EMLA) cream Apply a quarter size amount to affected area 1 hour prior to coming to chemotherapy.  Do not rub in.  Cover with plastic wrap.  . Nutritional Supplements (ENSURE ENLIVE PO) Take 237 mLs by mouth. 2-3/day  . ondansetron (ZOFRAN) 8 MG tablet Take 1 tablet (8 mg  total) by mouth every 8 (eight) hours as needed for nausea or vomiting.  Marland Kitchen PROAIR HFA 108 (90 Base) MCG/ACT inhaler Inhale 2 puffs into the lungs every 6 (six) hours as needed (for shortness of breath/wheezing/cough).   . Propylene Glycol-Glycerin (SOOTHE) 0.6-0.6 % SOLN Place 1-2 drops into both eyes 3 (three) times daily as needed (for dry/irritated eyes.).  Marland Kitchen Skin Protectants, Misc. (EUCERIN) cream Apply 1 application topically 4 (four) times daily as needed for dry skin (for itchy or dry skin).  . [DISCONTINUED] Tiotropium Bromide Monohydrate (SPIRIVA RESPIMAT) 2.5 MCG/ACT AERS Inhale 2 puffs into the lungs every evening.   Marland Kitchen levothyroxine (SYNTHROID) 125 MCG tablet Take 1 tablet (125 mcg total) by mouth daily before breakfast.  . SPIRIVA HANDIHALER 18 MCG inhalation capsule INHALE CONTENTS OF 1 CAPSULE VIA HANDHALER QD   No facility-administered encounter medications on file as of 11/29/2017.     ALLERGIES:  No Known Allergies   PHYSICAL EXAM:  ECOG Performance status: 1  Vitals:   11/29/17 1208  BP: (!) 170/88  Pulse: 66  Resp: 20  Temp: 98 F (36.7 C)  SpO2: 91%   Filed Weights   11/29/17 1208  Weight: 180 lb (81.6 kg)    Physical Exam  Constitutional: He is oriented to person, place, and time. He appears well-developed and well-nourished.  Cardiovascular: Normal rate, regular rhythm and normal heart sounds.  Pulmonary/Chest: Effort normal and breath sounds normal.  Musculoskeletal: Normal range of motion.  Neurological: He is alert and oriented to person, place, and time.  Skin: Skin is warm and dry.  Psychiatric: He has a normal mood and affect. His behavior is normal. Judgment and thought content normal.     LABORATORY DATA:  I have reviewed the labs as listed.  CBC    Component Value Date/Time   WBC 4.3 11/28/2017 1110   RBC 4.59 11/28/2017 1110   HGB 14.7 11/28/2017 1110   HCT 43.1 11/28/2017 1110   PLT 151 11/28/2017 1110   MCV 93.9 11/28/2017 1110    MCH 32.0 11/28/2017 1110  MCHC 34.1 11/28/2017 1110   RDW 15.2 11/28/2017 1110   LYMPHSABS 0.7 11/28/2017 1110   MONOABS 0.4 11/28/2017 1110   EOSABS 0.2 11/28/2017 1110   BASOSABS 0.0 11/28/2017 1110   CMP Latest Ref Rng & Units 11/28/2017 11/14/2017 10/31/2017  Glucose 70 - 99 mg/dL 121(H) 133(H) 94  BUN 8 - 23 mg/dL _0 Creatinine 0.61 - 1.24 mg/dL 1.11 1.01 0.95  Sodium 135 - 145 mmol/L 138 139 141  Potassium 3.5 - 5.1 mmol/L 3.9 4.2 4.2  Chloride 98 - 111 mmol/L 96(L) 98 102  CO2 22 - 32 mmol/L 30 32 31  Calcium 8.9 - 10.3 mg/dL 9.2 9.3 9.3  Total Protein 6.5 - 8.1 g/dL 7.6 7.5 7.4  Total Bilirubin 0.3 - 1.2 mg/dL 0.5 0.6 0.7  Alkaline Phos 38 - 126 U/L 74 78 78  AST 15 - 41 U/L 35 29 26  ALT 0 - 44 U/L _1 ASSESSMENT & PLAN:   Malignant neoplasm of bronchus and lung ([facility]) 1.  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 -Chemoradiation therapy with carboplatin and paclitaxel weekly from 06/11/2017 through 07/16/2017. -CT scan of the chest dated 09/07/2017 showed 10 x 7 mm right upper lobe nodule which has not substantially changed in the interval (previously measured 10 x 8 mm).  Right paratracheal adenopathy is no longer visible.  He got partial response and was recommended immunotherapy with Durvalumab every 2 weeks for 26 treatments based on excellent results from PACIFIC trial. -Started on Durvalumab on 09/20/2017 and is tolerating very well. - Denies any severe tiredness.  Today his TSH was found to be elevated at 190.  Prior to start of therapy, it was normal.  We will start him on Synthroid 125 mcg daily and recheck his TSH.  He will continue durvalumab and will see me in 4 weeks.  2.  Neuropathy: He reports that he has  numbness in both of his legs started couple of weeks ago.  This numbness is on and off.  He had some numbness on and off when he was getting paclitaxel also.      Orders placed this encounter:  Orders Placed This Encounter  Procedures  . CBC with Differential/Platelet  . Comprehensive metabolic panel  . TSH      Derek Jack, [place] [phone]

## 2017-11-29 NOTE — Patient Instructions (Signed)
[facility] Discharge Instructions for Patients Receiving Chemotherapy   Beginning January 23rd 2017 lab work for the [facility] will be done in the  Main lab at [facility] on 1st floor. If you have a lab appointment with the [facility] please come in thru the  Main Entrance and check in at the main information desk   Today you received the following chemotherapy agents Imfinzi. Follow-up as scheduled. Call clinic for any questions or concerns  To help prevent nausea and vomiting after your treatment, we encourage you to take your nausea medication   If you develop nausea and vomiting, or diarrhea that is not controlled by your medication, call the clinic.  The clinic phone number is [phone]. Office hours are Monday-Friday 8:30am-5:00pm.  BELOW ARE SYMPTOMS THAT SHOULD BE REPORTED IMMEDIATELY:  *FEVER GREATER THAN 101.0 F  *CHILLS WITH OR WITHOUT FEVER  NAUSEA AND VOMITING THAT IS NOT CONTROLLED WITH YOUR NAUSEA MEDICATION  *UNUSUAL SHORTNESS OF BREATH  *UNUSUAL BRUISING OR BLEEDING  TENDERNESS IN MOUTH AND THROAT WITH OR WITHOUT PRESENCE OF ULCERS  *URINARY PROBLEMS  *BOWEL PROBLEMS  UNUSUAL RASH Items with * indicate a potential emergency and should be followed up as soon as possible. If you have an emergency after office hours please contact your primary care physician or go to the nearest emergency department.  Please call the clinic during office hours if you have any questions or concerns.   You may also contact the Patient Navigator at [phone] should you have any questions or need assistance in obtaining follow up care.      Resources For Cancer Patients and their Caregivers ? American Cancer Society: Can assist with transportation, wigs, general needs, runs Look Good Feel Better.        769 235 8261 ? Cancer Care: Provides financial assistance, online support groups, medication/co-pay assistance.  1-800-813-HOPE  305-562-7817) ? [facility] Assists [place] cancer patients and their families through emotional , educational and financial support.  6146714500 ? [place] DSS Where to apply for food stamps, Medicaid and utility assistance. 651-561-3409 ? [facility]: Transportation to medical appointments. (647) 596-9177 ? Social Security Administration: May apply for disability if have a Stage IV cancer. 408-012-7571 231-377-2845 ? [employer], Disability and Transit Services: Assists with nutrition, care and transit needs. [phone]

## 2017-12-07 DIAGNOSIS — Z0001 Encounter for general adult medical examination with abnormal findings: Secondary | ICD-10-CM | POA: Diagnosis not present

## 2017-12-07 DIAGNOSIS — E663 Overweight: Secondary | ICD-10-CM | POA: Diagnosis not present

## 2017-12-07 DIAGNOSIS — R42 Dizziness and giddiness: Secondary | ICD-10-CM | POA: Diagnosis not present

## 2017-12-07 DIAGNOSIS — C3490 Malignant neoplasm of unspecified part of unspecified bronchus or lung: Secondary | ICD-10-CM | POA: Diagnosis not present

## 2017-12-07 DIAGNOSIS — Z1389 Encounter for screening for other disorder: Secondary | ICD-10-CM | POA: Diagnosis not present

## 2017-12-07 DIAGNOSIS — Z23 Encounter for immunization: Secondary | ICD-10-CM | POA: Diagnosis not present

## 2017-12-07 DIAGNOSIS — Z6826 Body mass index (BMI) 26.0-26.9, adult: Secondary | ICD-10-CM | POA: Diagnosis not present

## 2017-12-07 DIAGNOSIS — H6521 Chronic serous otitis media, right ear: Secondary | ICD-10-CM | POA: Diagnosis not present

## 2017-12-12 ENCOUNTER — Inpatient Hospital Stay (HOSPITAL_COMMUNITY): Payer: PPO

## 2017-12-12 DIAGNOSIS — Z5112 Encounter for antineoplastic immunotherapy: Secondary | ICD-10-CM | POA: Diagnosis not present

## 2017-12-12 DIAGNOSIS — C3490 Malignant neoplasm of unspecified part of unspecified bronchus or lung: Secondary | ICD-10-CM

## 2017-12-12 LAB — CBC WITH DIFFERENTIAL/PLATELET
Abs Immature Granulocytes: 0.02 10*3/uL (ref 0.00–0.07)
Basophils Absolute: 0 10*3/uL (ref 0.0–0.1)
Basophils Relative: 1 %
Eosinophils Absolute: 0.2 10*3/uL (ref 0.0–0.5)
Eosinophils Relative: 3 %
HCT: 47.2 % (ref 39.0–52.0)
Hemoglobin: 14.8 g/dL (ref 13.0–17.0)
Immature Granulocytes: 0 %
Lymphocytes Relative: 19 %
Lymphs Abs: 1 10*3/uL (ref 0.7–4.0)
MCH: 30.5 pg (ref 26.0–34.0)
MCHC: 31.4 g/dL (ref 30.0–36.0)
MCV: 97.1 fL (ref 80.0–100.0)
Monocytes Absolute: 0.5 10*3/uL (ref 0.1–1.0)
Monocytes Relative: 9 %
Neutro Abs: 3.6 10*3/uL (ref 1.7–7.7)
Neutrophils Relative %: 68 %
Platelets: 177 10*3/uL (ref 150–400)
RBC: 4.86 MIL/uL (ref 4.22–5.81)
RDW: 15.9 % — ABNORMAL HIGH (ref 11.5–15.5)
WBC: 5.3 10*3/uL (ref 4.0–10.5)
nRBC: 0 % (ref 0.0–0.2)

## 2017-12-12 LAB — COMPREHENSIVE METABOLIC PANEL
ALT: 26 U/L (ref 0–44)
AST: 41 U/L (ref 15–41)
Albumin: 4.6 g/dL (ref 3.5–5.0)
Alkaline Phosphatase: 87 U/L (ref 38–126)
Anion gap: 8 (ref 5–15)
BUN: 19 mg/dL (ref 8–23)
CO2: 31 mmol/L (ref 22–32)
Calcium: 9.5 mg/dL (ref 8.9–10.3)
Chloride: 100 mmol/L (ref 98–111)
Creatinine, Ser: 1.03 mg/dL (ref 0.61–1.24)
GFR calc Af Amer: >60 mL/min (ref >60)
GFR calc non Af Amer: >60 mL/min (ref >60)
Glucose, Bld: 81 mg/dL (ref 70–99)
Potassium: 4 mmol/L (ref 3.5–5.1)
Sodium: 139 mmol/L (ref 135–145)
Total Bilirubin: 0.8 mg/dL (ref 0.3–1.2)
Total Protein: 8.3 g/dL — ABNORMAL HIGH (ref 6.5–8.1)

## 2017-12-13 ENCOUNTER — Encounter (HOSPITAL_COMMUNITY): Payer: Self-pay

## 2017-12-13 ENCOUNTER — Other Ambulatory Visit: Payer: Self-pay

## 2017-12-13 ENCOUNTER — Inpatient Hospital Stay (HOSPITAL_COMMUNITY): Payer: PPO

## 2017-12-13 VITALS — BP 143/87 | HR 60 | Temp 97.7°F | Resp 18 | Wt 180.2 lb

## 2017-12-13 DIAGNOSIS — C3490 Malignant neoplasm of unspecified part of unspecified bronchus or lung: Secondary | ICD-10-CM

## 2017-12-13 DIAGNOSIS — Z5112 Encounter for antineoplastic immunotherapy: Secondary | ICD-10-CM | POA: Diagnosis not present

## 2017-12-13 MED ORDER — HEPARIN SOD (PORK) LOCK FLUSH 100 UNIT/ML IV SOLN
500.0000 [IU] | Freq: Once | INTRAVENOUS | Status: AC | PRN
  Administered 2017-12-13: 500 [IU]

## 2017-12-13 MED ORDER — SODIUM CHLORIDE 0.9 % IV SOLN
Freq: Once | INTRAVENOUS | Status: AC
  Administered 2017-12-13: 14:00:00 via INTRAVENOUS

## 2017-12-13 MED ORDER — SODIUM CHLORIDE 0.9 % IV SOLN
10.6000 mg/kg | Freq: Once | INTRAVENOUS | Status: AC
  Administered 2017-12-13: 860 mg via INTRAVENOUS
  Filled 2017-12-13: qty 7.2

## 2017-12-13 NOTE — Progress Notes (Signed)
Tolerated infusion w/o adverse reaction.  Alert, in no distress.  VSS.  Discharged ambulatory.  

## 2017-12-19 ENCOUNTER — Ambulatory Visit (HOSPITAL_COMMUNITY)
Admission: RE | Admit: 2017-12-19 | Discharge: 2017-12-19 | Disposition: A | Discharge status: Home / Self Care | Payer: PPO | Source: Ambulatory Visit | Attending: Nurse Practitioner | Admitting: Nurse Practitioner

## 2017-12-19 DIAGNOSIS — C3490 Malignant neoplasm of unspecified part of unspecified bronchus or lung: Secondary | ICD-10-CM | POA: Diagnosis not present

## 2017-12-19 DIAGNOSIS — K7460 Unspecified cirrhosis of liver: Secondary | ICD-10-CM | POA: Insufficient documentation

## 2017-12-19 DIAGNOSIS — J439 Emphysema, unspecified: Secondary | ICD-10-CM | POA: Diagnosis not present

## 2017-12-19 DIAGNOSIS — I700 Atherosclerosis of aorta: Secondary | ICD-10-CM | POA: Diagnosis not present

## 2017-12-19 DIAGNOSIS — R188 Other ascites: Secondary | ICD-10-CM | POA: Insufficient documentation

## 2017-12-19 MED ORDER — IOPAMIDOL (ISOVUE-300) INJECTION 61%
100.0000 mL | Freq: Once | INTRAVENOUS | Status: AC | PRN
  Administered 2017-12-19: 100 mL via INTRAVENOUS

## 2017-12-26 ENCOUNTER — Inpatient Hospital Stay (HOSPITAL_COMMUNITY): Payer: PPO

## 2017-12-26 DIAGNOSIS — C3491 Malignant neoplasm of unspecified part of right bronchus or lung: Secondary | ICD-10-CM

## 2017-12-26 DIAGNOSIS — Z5112 Encounter for antineoplastic immunotherapy: Secondary | ICD-10-CM | POA: Diagnosis not present

## 2017-12-26 LAB — COMPREHENSIVE METABOLIC PANEL
ALT: 19 U/L (ref 0–44)
AST: 27 U/L (ref 15–41)
Albumin: 4.5 g/dL (ref 3.5–5.0)
Alkaline Phosphatase: 80 U/L (ref 38–126)
Anion gap: 9 (ref 5–15)
BUN: 17 mg/dL (ref 8–23)
CO2: 33 mmol/L — ABNORMAL HIGH (ref 22–32)
Calcium: 9.5 mg/dL (ref 8.9–10.3)
Chloride: 99 mmol/L (ref 98–111)
Creatinine, Ser: 0.84 mg/dL (ref 0.61–1.24)
GFR calc Af Amer: >60 mL/min (ref >60)
GFR calc non Af Amer: >60 mL/min (ref >60)
Glucose, Bld: 77 mg/dL (ref 70–99)
Potassium: 3.9 mmol/L (ref 3.5–5.1)
Sodium: 141 mmol/L (ref 135–145)
Total Bilirubin: 0.9 mg/dL (ref 0.3–1.2)
Total Protein: 8 g/dL (ref 6.5–8.1)

## 2017-12-26 LAB — CBC WITH DIFFERENTIAL/PLATELET
Abs Immature Granulocytes: 0.02 10*3/uL (ref 0.00–0.07)
Basophils Absolute: 0 10*3/uL (ref 0.0–0.1)
Basophils Relative: 1 %
Eosinophils Absolute: 0.2 10*3/uL (ref 0.0–0.5)
Eosinophils Relative: 3 %
HCT: 45.6 % (ref 39.0–52.0)
Hemoglobin: 14.4 g/dL (ref 13.0–17.0)
Immature Granulocytes: 0 %
Lymphocytes Relative: 16 %
Lymphs Abs: 0.9 10*3/uL (ref 0.7–4.0)
MCH: 30.6 pg (ref 26.0–34.0)
MCHC: 31.6 g/dL (ref 30.0–36.0)
MCV: 96.8 fL (ref 80.0–100.0)
Monocytes Absolute: 0.5 10*3/uL (ref 0.1–1.0)
Monocytes Relative: 9 %
Neutro Abs: 4.1 10*3/uL (ref 1.7–7.7)
Neutrophils Relative %: 71 %
Platelets: 173 10*3/uL (ref 150–400)
RBC: 4.71 MIL/uL (ref 4.22–5.81)
RDW: 15.9 % — ABNORMAL HIGH (ref 11.5–15.5)
WBC: 5.7 10*3/uL (ref 4.0–10.5)
nRBC: 0 % (ref 0.0–0.2)

## 2017-12-26 LAB — TSH: TSH: 1.312 u[IU]/mL (ref 0.350–4.500)

## 2017-12-27 ENCOUNTER — Ambulatory Visit (HOSPITAL_COMMUNITY): Payer: PPO

## 2017-12-27 ENCOUNTER — Encounter (HOSPITAL_COMMUNITY): Payer: Self-pay | Admitting: Hematology

## 2017-12-27 ENCOUNTER — Encounter (HOSPITAL_COMMUNITY): Payer: Self-pay

## 2017-12-27 ENCOUNTER — Inpatient Hospital Stay (HOSPITAL_BASED_OUTPATIENT_CLINIC_OR_DEPARTMENT_OTHER): Payer: PPO | Admitting: Hematology

## 2017-12-27 ENCOUNTER — Other Ambulatory Visit: Payer: Self-pay

## 2017-12-27 ENCOUNTER — Inpatient Hospital Stay (HOSPITAL_COMMUNITY): Payer: PPO

## 2017-12-27 VITALS — BP 139/61 | HR 61 | Temp 97.9°F | Resp 18 | Wt 176.0 lb

## 2017-12-27 DIAGNOSIS — T451X5A Adverse effect of antineoplastic and immunosuppressive drugs, initial encounter: Secondary | ICD-10-CM

## 2017-12-27 DIAGNOSIS — G620 Drug-induced polyneuropathy: Secondary | ICD-10-CM | POA: Diagnosis not present

## 2017-12-27 DIAGNOSIS — F17210 Nicotine dependence, cigarettes, uncomplicated: Secondary | ICD-10-CM | POA: Diagnosis not present

## 2017-12-27 DIAGNOSIS — Z5112 Encounter for antineoplastic immunotherapy: Secondary | ICD-10-CM | POA: Diagnosis not present

## 2017-12-27 DIAGNOSIS — K7460 Unspecified cirrhosis of liver: Secondary | ICD-10-CM | POA: Diagnosis not present

## 2017-12-27 DIAGNOSIS — K766 Portal hypertension: Secondary | ICD-10-CM | POA: Diagnosis not present

## 2017-12-27 DIAGNOSIS — C3411 Malignant neoplasm of upper lobe, right bronchus or lung: Secondary | ICD-10-CM

## 2017-12-27 DIAGNOSIS — C3490 Malignant neoplasm of unspecified part of unspecified bronchus or lung: Secondary | ICD-10-CM

## 2017-12-27 MED ORDER — HEPARIN SOD (PORK) LOCK FLUSH 100 UNIT/ML IV SOLN
500.0000 [IU] | Freq: Once | INTRAVENOUS | Status: AC | PRN
  Administered 2017-12-27: 500 [IU]

## 2017-12-27 MED ORDER — SODIUM CHLORIDE 0.9 % IV SOLN
10.7000 mg/kg | Freq: Once | INTRAVENOUS | Status: AC
  Administered 2017-12-27: 860 mg via INTRAVENOUS
  Filled 2017-12-27: qty 7.2

## 2017-12-27 MED ORDER — SODIUM CHLORIDE 0.9 % IV SOLN
Freq: Once | INTRAVENOUS | Status: AC
  Administered 2017-12-27: 11:00:00 via INTRAVENOUS

## 2017-12-27 NOTE — Patient Instructions (Signed)
[facility] Discharge Instructions     Thank you for choosing [facility] at [facility] to provide your oncology and hematology care.  To afford each patient quality time with our provider, please arrive at least 15 minutes before your scheduled appointment time.   If you have a lab appointment with the Cancer Center please come in thru the  Main Entrance and check in at the main information desk  You need to re-schedule your appointment should you arrive 10 or more minutes late.  We strive to give you quality time with our providers, and arriving late affects you and other patients whose appointments are after yours.  Also, if you no show three or more times for appointments you may be dismissed from the clinic at the providers discretion.     Again, thank you for choosing [facility].  Our hope is that these requests will decrease the amount of time that you wait before being seen by our physicians.       _____________________________________________________________  Should you have questions after your visit to [facility], please contact our office at [phone] between the hours of 8:00 a.m. and 4:30 p.m.  Voicemails left after 4:00 p.m. will not be returned until the following business day.  For prescription refill requests, have your pharmacy contact our office and allow 72 hours.    Cancer Center Support Programs:   > Cancer Support Group  2nd Tuesday of the month 1pm-2pm, Journey Room    

## 2017-12-27 NOTE — Assessment & Plan Note (Signed)
1.  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 -Chemoradiation therapy with carboplatin and paclitaxel weekly from 06/11/2017 through 07/16/2017. -CT scan of the chest dated 09/07/2017 showed 10 x 7 mm right upper lobe nodule which has not substantially changed in the interval (previously measured 10 x 8 mm).  Right paratracheal adenopathy is no longer visible.  He got partial response and was recommended immunotherapy with Durvalumab every 2 weeks for 26 treatments based on excellent results from PACIFIC trial. -Started on Durvalumab on 09/20/2017 and is tolerating very well. - We have reviewed CT scan of the chest, abdomen and pelvis dated 12/19/2017 which showed small spiculated right upper lobe nodule unchanged from the most recent prior scan.  No new lung lesions.  No evidence of metastatic disease in the chest. 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  He has a follow-up with GI.  He reports he quit drinking alcohol about 3 years ago. -His lab counts today are within normal limits.  TSH was normal.  He may proceed with his durvalumab today.  He does not have any immunotherapy related side effects.  We will reevaluate him in 6 weeks.  2.  Neuropathy: He reports that he has numbness in both of his legs started couple of weeks ago.  This numbness is on and off.  He had some numbness on and off when he was getting paclitaxel also.

## 2017-12-27 NOTE — Progress Notes (Signed)
Tolerated infusion w/o adverse reaction.  Alert, in no distress.  VSS.  Discharged ambulatory in c/o spouse.  

## 2017-12-27 NOTE — Progress Notes (Signed)
[place], [place] [address]   CLINIC:  Medical Oncology/Hematology  PCP:  Sharilyn Sites, MD [address] 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  CURRENT THERAPY: Imfiniz   BRIEF ONCOLOGIC HISTORY:    Carcinoma, lung ([place])   06/05/2017 Initial Diagnosis    Carcinoma, lung ([place])    06/05/2017 - 07/18/2017 Chemotherapy    The patient had palonosetron (ALOXI) injection 0.25 mg, 0.25 mg, Intravenous,  Once, 6 of 6 cycles Administration: 0.25 mg (06/11/2017), 0.25 mg (07/09/2017), 0.25 mg (07/16/2017), 0.25 mg (06/18/2017), 0.25 mg (06/25/2017), 0.25 mg (07/02/2017) CARBOplatin (PARAPLATIN) 190 mg in sodium chloride 0.9 % 250 mL chemo infusion, 190 mg (100 % of original dose 192.8 mg), Intravenous,  Once, 6 of 6 cycles Dose modification:   (original dose 192.8 mg, Cycle 1),   (original dose 192.8 mg, Cycle 5), 144.6 mg (original dose 144.6 mg, Cycle 6),   (original dose 192.8 mg, Cycle 2),   (original dose 192.8 mg, Cycle 3),   (original dose 192.8 mg, Cycle 4) Administration: 190 mg (06/11/2017), 190 mg (07/09/2017), 140 mg (07/16/2017), 190 mg (06/18/2017), 190 mg (06/25/2017), 190 mg (07/02/2017) PACLitaxel (TAXOL) 90 mg in dextrose 5 % 250 mL chemo infusion (</= 75m/m2), 45 mg/m2 = 90 mg, Intravenous,  Once, 6 of 6 cycles Dose modification: 40.5 mg/m2 (90 % of original dose 45 mg/m2, Cycle 5, Reason: Other (see comments), Comment: early neuropathy), 36 mg/m2 (80 % of original dose 45 mg/m2, Cycle 6, Reason: Provider Judgment) Administration: 90 mg (06/11/2017), 78 mg (07/09/2017), 72 mg (07/16/2017), 90 mg (06/18/2017), 78 mg (06/25/2017), 78 mg (07/02/2017)  for chemotherapy treatment.      Malignant neoplasm of bronchus and lung ([facility]   06/08/2017 Initial Diagnosis    Malignant neoplasm of bronchus and lung ([facility]    09/19/2017 -  Chemotherapy    The patient had durvalumab (IMFINZI) 860 mg in  sodium chloride 0.9 % 100 mL chemo infusion, 800 mg, Intravenous,  Once, 8 of 14 cycles Administration: 860 mg (09/20/2017), 860 mg (10/04/2017), 860 mg (10/18/2017), 860 mg (11/01/2017), 860 mg (11/15/2017), 860 mg (11/29/2017), 860 mg (12/13/2017), 860 mg (12/27/2017)  for chemotherapy treatment.       INTERVAL HISTORY:  Mr. [id number].o. male returns for routine follow-up for non small cell lung cancer. Patient is here today with his wife. He is doing well with very few side effects. He is experiencing fatigue and weakness throughout the day. He also has numbness in his legs from the knees down. This is stable and has not progressed any more. He is also having a cough that has progressively gotten better. He denies hemoptysis. Denies any bleeding. Denies any nausea, vomiting, diarrhea. Denies any muscle or join pain. Denies any new pains. Patient reports his appetite at 100% and he is maintaining his weight well. His energy level is 75 %.     REVIEW OF SYSTEMS:  Review of Systems  Respiratory: Positive for shortness of breath.   Neurological: Positive for numbness.  All other systems reviewed and are negative.    PAST MEDICAL/SURGICAL HISTORY:  Past Medical History:  Diagnosis Date  . Anxiety   . Arthritis   . Cirrhosis ([facility]    confirmed by MRI on 07/04/11.  no immunizations  . COPD (chronic obstructive pulmonary disease) ([facility]   . Depression with anxiety   . ETOH abuse   .  Hyperlipidemia   . Nodule of upper lobe of right lung    with mediastinal adenopathy  . Wears dentures   . Wears glasses    Past Surgical History:  Procedure Laterality Date  . bilateral cataract surgery     [place]  . broken arm  6 yrs ago   left otif of wrist-[facility]  . CLOSED REDUCTION WRIST FRACTURE Right 09/02/2015   Procedure: RIGHT WRIST REDUCTION;  Surgeon: Renette Butters, MD;  Location: [facility];  Service: Orthopedics;  Laterality: Right;  with MAC  . COLONOSCOPY  1996   [facility]: external  hemorrhoids, no polyps  . COLONOSCOPY  06/28/2011   Dr. Ala Bent diverticulosis,tubular adenoma, hyperplastic polyp  . COLONOSCOPY WITH PROPOFOL N/A 04/26/2017   Dr. Gala Romney: perianal and digital rectal examinations normal, diffusely congested colonic mucosa but otherwise normal  . ESOPHAGOGASTRODUODENOSCOPY  06/28/2011   Dr. Chelsea Aus erosive reflux esophagitis, hiatal hernia-gastritis  . ESOPHAGOGASTRODUODENOSCOPY (EGD) WITH PROPOFOL N/A 04/26/2017   Dr. Gala Romney: normal esophagus, small hiatal hernia, GAVE, portal hypertensive gastropathy, normal duodenal bulb and second portion, no specimens collected. 2 years screening   . EXTERNAL EAR SURGERY     right ear-cleaned out ear and created new eardrum  . INGUINAL HERNIA REPAIR  2-3 yrs ago   left-[facility]  . INTRAMEDULLARY (IM) NAIL INTERTROCHANTERIC Right 09/02/2015   Procedure: RIGHT  INTERTROCHANTRIC HIP;  Surgeon: Renette Butters, MD;  Location: [facility];  Service: Orthopedics;  Laterality: Right;  With MAC  . KNEE SURGERY     left-arthroscopy-[place]  . PORTACATH PLACEMENT Left 06/08/2017   Procedure: INSERTION POWER PORT WITH  ATTACHED CATHETER LEFT SUBCLAVIAN;  Surgeon: Aviva Signs, MD;  [facility];  Service: General;  Laterality: Left;  . SKULL FRACTURE ELEVATION     fractured cheeck bone repaired  . TOTAL HIP ARTHROPLASTY  12/18/2011   Procedure: TOTAL HIP ARTHROPLASTY;  Surgeon: Carole Civil, MD;  [facility];  Service: Orthopedics;  Laterality: Left;  Marland Kitchen VIDEO BRONCHOSCOPY WITH ENDOBRONCHIAL ULTRASOUND N/A 05/04/2017   Procedure: VIDEO BRONCHOSCOPY WITH ENDOBRONCHIAL ULTRASOUND;  Surgeon: Melrose Nakayama, MD;  Location: [facility];  Service: Thoracic;  Laterality: N/A;  . WISDOM TOOTH EXTRACTION       SOCIAL HISTORY:  Social History   Socioeconomic History  . Marital status: Married    Spouse name: Not on file  . Number of children: Not on file  . Years of education: Not on file  . Highest education level:  Not on file  Occupational History  . Occupation: retired    Fish farm manager: [employer]  Social Needs  . Financial resource strain: Not on file  . Food insecurity:    Worry: Not on file    Inability: Not on file  . Transportation needs:    Medical: Not on file    Non-medical: Not on file  Tobacco Use  . Smoking status: Current Every Day Smoker    Packs/day: 0.50    Years: 62.00    Pack years: 31.00    Types: Cigarettes  . Smokeless tobacco: Never Used  . Tobacco comment: 1/2 ppd > 60 years as of 06/27/17: 7 cigarettes or less a day  Substance and Sexual Activity  . Alcohol use: Not Currently    Comment: history of ETOH abuse, stopped 1.5 years ago (had one drink at the [employer]).   . Drug use: No  . Sexual activity: Yes    Birth control/protection: None  Lifestyle  . Physical activity:  Days per week: Not on file    Minutes per session: Not on file  . Stress: Not on file  Relationships  . Social connections:    Talks on phone: Not on file    Gets together: Not on file    Attends religious service: Not on file    Active member of club or organization: Not on file    Attends meetings of clubs or organizations: Not on file    Relationship status: Not on file  . Intimate partner violence:    Fear of current or ex partner: Not on file    Emotionally abused: Not on file    Physically abused: Not on file    Forced sexual activity: Not on file  Other Topics Concern  . Not on file  Social History Narrative  . Not on file    FAMILY HISTORY:  Family History  Problem Relation Age of Onset  . Stroke Mother 69  . Heart disease Father 60       MI  . Diabetes Maternal Uncle   . Colon cancer Neg Hx   . Cancer Neg Hx     CURRENT MEDICATIONS:  Outpatient Encounter Medications as of 12/27/2017  Medication Sig  . cholecalciferol (VITAMIN D) 1000 units tablet Take 1,000 Units by mouth daily.  Hunt Oris (IMFINZI IV) Inject into the vein.  . fluticasone (FLONASE)  50 MCG/ACT nasal spray PLACE 2 SPRAYS IEN QD  . levothyroxine (SYNTHROID, LEVOTHROID) 125 MCG tablet TAKE 1 TABLET(125 MCG) BY MOUTH DAILY BEFORE BREAKFAST  . meclizine (ANTIVERT) 25 MG tablet TK 1 T PO  Q 6 H PRN  . Nutritional Supplements (ENSURE ENLIVE PO) Take 237 mLs by mouth. 2-3/day  . PROAIR HFA 108 (90 Base) MCG/ACT inhaler Inhale 2 puffs into the lungs every 6 (six) hours as needed (for shortness of breath/wheezing/cough).   . Propylene Glycol-Glycerin (SOOTHE) 0.6-0.6 % SOLN Place 1-2 drops into both eyes 3 (three) times daily as needed (for dry/irritated eyes.).  Marland Kitchen Skin Protectants, Misc. (EUCERIN) cream Apply 1 application topically 4 (four) times daily as needed for dry skin (for itchy or dry skin).  . SPIRIVA HANDIHALER 18 MCG inhalation capsule INHALE CONTENTS OF 1 CAPSULE VIA HANDHALER QD  . [EXPIRED] 0.9 %  sodium chloride infusion   . [EXPIRED] durvalumab (IMFINZI) 860 mg in sodium chloride 0.9 % 100 mL chemo infusion   . [EXPIRED] heparin lock flush 100 unit/mL    No facility-administered encounter medications on file as of 12/27/2017.     ALLERGIES:  No Known Allergies   PHYSICAL EXAM:  ECOG Performance status: 1  VITAL SIGNS: BP:151/63, P:87, R:20, T:97.6, SATS:93% Weight: 176  Physical Exam  Constitutional: He is oriented to person, place, and time. He appears well-developed and well-nourished.  Cardiovascular: Normal rate, regular rhythm and normal heart sounds.  Pulmonary/Chest: Effort normal and breath sounds normal.  Musculoskeletal: Normal range of motion.  Neurological: He is alert and oriented to person, place, and time.  Skin: Skin is warm and dry.  Psychiatric: He has a normal mood and affect. His behavior is normal. Judgment and thought content normal.     LABORATORY DATA:  I have reviewed the labs as listed.  CBC    Component Value Date/Time   WBC 5.7 12/26/2017 1158   RBC 4.71 12/26/2017 1158   HGB 14.4 12/26/2017 1158   HCT 45.6  12/26/2017 1158   PLT 173 12/26/2017 1158   MCV 96.8 12/26/2017 1158   MCH  30.6 12/26/2017 1158   MCHC 31.6 12/26/2017 1158   RDW 15.9 (H) 12/26/2017 1158   LYMPHSABS 0.9 12/26/2017 1158   MONOABS 0.5 12/26/2017 1158   EOSABS 0.2 12/26/2017 1158   BASOSABS 0.0 12/26/2017 1158   CMP Latest Ref Rng & Units 12/26/2017 12/12/2017 11/28/2017  Glucose 70 - 99 mg/dL 77 81 121(H)  BUN 8 - 23 mg/dL _0 Creatinine 0.61 - 1.24 mg/dL 0.84 1.03 1.11  Sodium 135 - 145 mmol/L 141 139 138  Potassium 3.5 - 5.1 mmol/L 3.9 4.0 3.9  Chloride 98 - 111 mmol/L 99 100 96(L)  CO2 22 - 32 mmol/L 33(H) 31 30  Calcium 8.9 - 10.3 mg/dL 9.5 9.5 9.2  Total Protein 6.5 - 8.1 g/dL 8.0 8.3(H) 7.6  Total Bilirubin 0.3 - 1.2 mg/dL 0.9 0.8 0.5  Alkaline Phos 38 - 126 U/L 80 87 74  AST 15 - 41 U/L 27 41 35  ALT 0 - 44 U/L _1 DIAGNOSTIC IMAGING:  I have independently reviewed images of his scans.     ASSESSMENT & PLAN:   Malignant neoplasm of bronchus and lung (H[facility]) 1.  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 -Chemoradiation therapy with carboplatin and paclitaxel weekly from 06/11/2017 through 07/16/2017. -CT scan of the chest dated 09/07/2017 showed 10 x 7 mm right upper lobe nodule which has not substantially changed in the interval (previously measured 10 x 8 mm).  Right paratracheal adenopathy is no longer visible.  He got partial response and was recommended immunotherapy with Durvalumab every 2 weeks for 26 treatments based on excellent results from [facility] trial. -Started on Durvalumab on 09/20/2017 and is tolerating very well. - We have reviewed CT scan of the chest, abdomen and pelvis dated 12/19/2017 which showed small spiculated right upper lobe  nodule unchanged from the most recent prior scan.  No new lung lesions.  No evidence of metastatic disease in the chest. 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  He has a follow-up with GI.  He reports he quit drinking alcohol about 3 years ago. -His lab counts today are within normal limits.  TSH was normal.  He may proceed with his durvalumab today.  He does not have any immunotherapy related side effects.  We will reevaluate him in 6 weeks.  2.  Neuropathy: He reports that he has numbness in both of his legs started couple of weeks ago.  This numbness is on and off.  He had some numbness on and off when he was getting paclitaxel also.      Orders placed this encounter:  Orders Placed This Encounter  Procedures  . CBC with Differential/Platelet  . Comprehensive metabolic panel  . TSH      Derek Jack, [place] [phone]

## 2017-12-31 ENCOUNTER — Ambulatory Visit (INDEPENDENT_AMBULATORY_CARE_PROVIDER_SITE_OTHER): Payer: PPO | Admitting: Gastroenterology

## 2017-12-31 ENCOUNTER — Encounter: Payer: Self-pay | Admitting: Gastroenterology

## 2017-12-31 VITALS — BP 173/80 | HR 97 | Temp 96.6°F | Ht 69.0 in | Wt 177.0 lb

## 2017-12-31 DIAGNOSIS — K7030 Alcoholic cirrhosis of liver without ascites: Secondary | ICD-10-CM | POA: Diagnosis not present

## 2017-12-31 NOTE — Progress Notes (Signed)
Referring Provider: Sharilyn Sites, MD Primary Care Physician:  Sharilyn Sites, MD  Primary GI: Dr. Gala Romney   Chief Complaint  Patient presents with  . Cirrhosis    HPI:   Ian Aguilar is a 77 y.o. male  with a history of cirrhosis presumably related to ETOH abuse in past presenting for 37-monthfollow-up. 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 History of pancreatic cysts in past felt to be benign and not noted on recent PET. Next EGD due in 2021. Imaging on file from Oct 2019 with cirrhosis but no HCC, mild portal hypertension suggested with prominent spleen.   Only complaint is of feeling off balance. States he had a lot of "goo" in his ear. PCP aware and managing. No abdominal pain, N/V. No distension. No lower extremity edema. Denies confusion, mental status changes. Continues to abstain from alcohol.   Past Medical History:  Diagnosis Date  . Anxiety   . Arthritis   . Cirrhosis ([facility]    confirmed by MRI on 07/04/11.  no immunizations  . COPD (chronic obstructive pulmonary disease) ([facility]   . Depression with anxiety   . ETOH abuse   . Hyperlipidemia   . Nodule of upper lobe of right lung    with mediastinal adenopathy  . Wears dentures   . Wears glasses     Past Surgical History:  Procedure Laterality Date  . bilateral cataract surgery     [place]  . broken arm  6 yrs ago   left otif of wrist-[facility]  . CLOSED REDUCTION WRIST FRACTURE Right 09/02/2015   Procedure: RIGHT WRIST REDUCTION;  Surgeon: TRenette Butters MD;  [place] [facility]  Service: Orthopedics;  Laterality: Right;  with MAC  . COLONOSCOPY  1996   [facility]: external hemorrhoids, no polyps  . COLONOSCOPY  06/28/2011   Dr. RAla Bentdiverticulosis,tubular adenoma, hyperplastic polyp  . COLONOSCOPY WITH PROPOFOL N/A 04/26/2017   Dr. RGala Romney perianal and digital rectal examinations normal, diffusely congested colonic mucosa but otherwise normal  . ESOPHAGOGASTRODUODENOSCOPY  06/28/2011   Dr. RChelsea Auserosive reflux esophagitis, hiatal hernia-gastritis  . ESOPHAGOGASTRODUODENOSCOPY (EGD) WITH PROPOFOL N/A 04/26/2017   Dr. RGala Romney normal esophagus, small hiatal hernia, GAVE, portal hypertensive gastropathy, normal duodenal bulb and second portion, no specimens collected. 2 years screening   . EXTERNAL EAR SURGERY     right ear-cleaned out ear and created new eardrum  . INGUINAL HERNIA REPAIR  2-3 yrs ago   left-[facility]  . INTRAMEDULLARY (IM) NAIL INTERTROCHANTERIC Right 09/02/2015   Procedure: RIGHT  INTERTROCHANTRIC HIP;  Surgeon: TRenette Butters MD;  [place]  Service: Orthopedics;  Laterality: Right;  With MAC  . KNEE SURGERY     left-arthroscopy-[place]  . PORTACATH PLACEMENT Left 06/08/2017   Procedure: INSERTION POWER PORT WITH  ATTACHED CATHETER LEFT SUBCLAVIAN;  Surgeon: JAviva Signs MD;  [facility];  Service: General;  Laterality: Left;  . SKULL FRACTURE ELEVATION     fractured cheeck bone repaired  . TOTAL HIP ARTHROPLASTY  12/18/2011   Procedure: TOTAL HIP ARTHROPLASTY;  Surgeon: SCarole Civil MD;  [facility];  Service: Orthopedics;  Laterality: Left;  .Marland KitchenVIDEO BRONCHOSCOPY WITH ENDOBRONCHIAL ULTRASOUND N/A 05/04/2017   Procedure: VIDEO BRONCHOSCOPY WITH ENDOBRONCHIAL ULTRASOUND;  Surgeon: HMelrose Nakayama MD;  [facility];  Service: Thoracic;  Laterality: N/A;  . WISDOM TOOTH EXTRACTION      Current Outpatient Medications  Medication Sig Dispense Refill  . cholecalciferol (VITAMIN D)  1000 units tablet Take 1,000 Units by mouth daily.    Hunt Oris (IMFINZI IV) Inject into the vein.    . fluticasone (FLONASE) 50 MCG/ACT nasal spray PLACE 2 SPRAYS IEN QD  11  . levothyroxine (SYNTHROID, LEVOTHROID) 125 MCG tablet TAKE 1 TABLET(125 MCG) BY MOUTH DAILY BEFORE BREAKFAST 90 tablet 1  . meclizine (ANTIVERT) 25 MG tablet TK 1 T PO  Q 6 H PRN  0  . Nutritional Supplements (ENSURE ENLIVE PO) Take 237 mLs by mouth. 2-3/day    .  PROAIR HFA 108 (90 Base) MCG/ACT inhaler Inhale 2 puffs into the lungs every 6 (six) hours as needed (for shortness of breath/wheezing/cough).   2  . Propylene Glycol-Glycerin (SOOTHE) 0.6-0.6 % SOLN Place 1-2 drops into both eyes 3 (three) times daily as needed (for dry/irritated eyes.).    Marland Kitchen Skin Protectants, Misc. (EUCERIN) cream Apply 1 application topically 4 (four) times daily as needed for dry skin (for itchy or dry skin).    . SPIRIVA HANDIHALER 18 MCG inhalation capsule INHALE CONTENTS OF 1 CAPSULE VIA HANDHALER QD  10   No current facility-administered medications for this visit.     Allergies as of 12/31/2017  . (No Known Allergies)    Family History  Problem Relation Age of Onset  . Stroke Mother 33  . Heart disease Father 5       MI  . Diabetes Maternal Uncle   . Colon cancer Neg Hx   . Cancer Neg Hx     Social History   Socioeconomic History  . Marital status: Married    Spouse name: Not on file  . Number of children: Not on file  . Years of education: Not on file  . Highest education level: Not on file  Occupational History  . Occupation: retired    Fish farm manager: [employer]  Social Needs  . Financial resource strain: Not on file  . Food insecurity:    Worry: Not on file    Inability: Not on file  . Transportation needs:    Medical: Not on file    Non-medical: Not on file  Tobacco Use  . Smoking status: Current Every Day Smoker    Packs/day: 0.50    Years: 62.00    Pack years: 31.00    Types: Cigarettes  . Smokeless tobacco: Never Used  . Tobacco comment: 1/2 ppd > 60 years as of 06/27/17: 7 cigarettes or less a day  Substance and Sexual Activity  . Alcohol use: Not Currently    Comment: history of ETOH abuse, stopped 1.5 years ago (had one drink at the [employer]).   . Drug use: No  . Sexual activity: Yes    Birth control/protection: None  Lifestyle  . Physical activity:    Days per week: Not on file    Minutes per session: Not on file    . Stress: Not on file  Relationships  . Social connections:    Talks on phone: Not on file    Gets together: Not on file    Attends religious service: Not on file    Active member of club or organization: Not on file    Attends meetings of clubs or organizations: Not on file    Relationship status: Not on file  Other Topics Concern  . Not on file  Social History Narrative  . Not on file    Review of Systems: Gen: Denies fever, chills, anorexia. Denies fatigue, weakness, weight loss.  CV: Denies chest pain, palpitations, syncope, peripheral edema, and claudication. Resp: Denies dyspnea at rest, cough, wheezing, coughing up blood, and pleurisy. GI:see HPI  Derm: Denies rash, itching, dry skin Psych: Denies depression, anxiety, memory loss, confusion. No homicidal or suicidal ideation.  Heme: Denies bruising, bleeding, and enlarged lymph nodes.  Physical Exam: BP (!) 173/80   Pulse 97   Temp (!) 96.6 F (35.9 C) (Oral)   Ht 5' 9"  (1.753 m)   Wt 177 lb (80.3 kg)   BMI 26.14 kg/m  General:   Alert and oriented. No distress noted. Pleasant and cooperative.  Head:  Normocephalic and atraumatic. Eyes:  Conjuctiva clear without scleral icterus. Mouth:  Oral mucosa pink and moist. Good dentition. No lesions. Abdomen:  +BS, soft, non-tender and non-distended. Prominent liver, easily palpable on exam Msk:  Symmetrical without gross deformities. Normal posture. Extremities:  Without edema. Neurologic:  Alert and  oriented x4 Psych:  Alert and cooperative. Normal mood and affect.

## 2017-12-31 NOTE — Assessment & Plan Note (Signed)
77 year old male continues to be well-compensated. EGD due in 2021. Imaging on file, due again in April 2019. He is followed closely by Oncology due to history of lung cancer, with serial imaging. Will see him in 6 months and order imaging at that time as appropriate. I have also asked staff to reach out regarding Hep A/B status from PCP, as patient is not sure if completed this or not.

## 2017-12-31 NOTE — Patient Instructions (Signed)
Keep up the good work!  We will see you again in 6 months.  I am checking to see if you have had the Hepatitis A and B vaccinations.  It was a pleasure to see you today. I strive to create trusting relationships with patients to provide genuine, compassionate, and quality care. I value your feedback. If you receive a survey regarding your visit,  I greatly appreciate you taking time to fill this out.   Annitta Needs, PhD, ANP-BC [facility]

## 2018-01-01 NOTE — Progress Notes (Signed)
cc'ed to pcp °

## 2018-01-09 ENCOUNTER — Inpatient Hospital Stay (HOSPITAL_COMMUNITY): Payer: PPO | Attending: Hematology

## 2018-01-09 DIAGNOSIS — Z5112 Encounter for antineoplastic immunotherapy: Secondary | ICD-10-CM | POA: Insufficient documentation

## 2018-01-09 DIAGNOSIS — C3411 Malignant neoplasm of upper lobe, right bronchus or lung: Secondary | ICD-10-CM | POA: Diagnosis not present

## 2018-01-09 DIAGNOSIS — C3490 Malignant neoplasm of unspecified part of unspecified bronchus or lung: Secondary | ICD-10-CM

## 2018-01-09 LAB — CBC WITH DIFFERENTIAL/PLATELET
Abs Immature Granulocytes: 0.01 10*3/uL (ref 0.00–0.07)
Basophils Absolute: 0 10*3/uL (ref 0.0–0.1)
Basophils Relative: 1 %
Eosinophils Absolute: 0.1 10*3/uL (ref 0.0–0.5)
Eosinophils Relative: 2 %
HCT: 46.9 % (ref 39.0–52.0)
Hemoglobin: 14.8 g/dL (ref 13.0–17.0)
Immature Granulocytes: 0 %
Lymphocytes Relative: 13 %
Lymphs Abs: 0.9 10*3/uL (ref 0.7–4.0)
MCH: 30.8 pg (ref 26.0–34.0)
MCHC: 31.6 g/dL (ref 30.0–36.0)
MCV: 97.7 fL (ref 80.0–100.0)
Monocytes Absolute: 0.6 10*3/uL (ref 0.1–1.0)
Monocytes Relative: 9 %
Neutro Abs: 4.9 10*3/uL (ref 1.7–7.7)
Neutrophils Relative %: 75 %
Platelets: 163 10*3/uL (ref 150–400)
RBC: 4.8 MIL/uL (ref 4.22–5.81)
RDW: 16.2 % — ABNORMAL HIGH (ref 11.5–15.5)
WBC: 6.5 10*3/uL (ref 4.0–10.5)
nRBC: 0 % (ref 0.0–0.2)

## 2018-01-09 LAB — COMPREHENSIVE METABOLIC PANEL
ALT: 18 U/L (ref 0–44)
AST: 25 U/L (ref 15–41)
Albumin: 4.4 g/dL (ref 3.5–5.0)
Alkaline Phosphatase: 80 U/L (ref 38–126)
Anion gap: 7 (ref 5–15)
BUN: 21 mg/dL (ref 8–23)
CO2: 30 mmol/L (ref 22–32)
Calcium: 9.3 mg/dL (ref 8.9–10.3)
Chloride: 101 mmol/L (ref 98–111)
Creatinine, Ser: 0.87 mg/dL (ref 0.61–1.24)
GFR calc Af Amer: >60 mL/min (ref >60)
GFR calc non Af Amer: >60 mL/min (ref >60)
Glucose, Bld: 87 mg/dL (ref 70–99)
Potassium: 3.9 mmol/L (ref 3.5–5.1)
Sodium: 138 mmol/L (ref 135–145)
Total Bilirubin: 1 mg/dL (ref 0.3–1.2)
Total Protein: 8.2 g/dL — ABNORMAL HIGH (ref 6.5–8.1)

## 2018-01-10 ENCOUNTER — Inpatient Hospital Stay (HOSPITAL_COMMUNITY): Payer: PPO

## 2018-01-10 VITALS — BP 148/59 | HR 57 | Temp 97.7°F | Resp 18 | Wt 175.6 lb

## 2018-01-10 DIAGNOSIS — Z5112 Encounter for antineoplastic immunotherapy: Secondary | ICD-10-CM | POA: Diagnosis not present

## 2018-01-10 DIAGNOSIS — C3490 Malignant neoplasm of unspecified part of unspecified bronchus or lung: Secondary | ICD-10-CM

## 2018-01-10 MED ORDER — SODIUM CHLORIDE 0.9 % IV SOLN
Freq: Once | INTRAVENOUS | Status: AC
  Administered 2018-01-10: 13:00:00 via INTRAVENOUS

## 2018-01-10 MED ORDER — HEPARIN SOD (PORK) LOCK FLUSH 100 UNIT/ML IV SOLN
500.0000 [IU] | Freq: Once | INTRAVENOUS | Status: AC | PRN
  Administered 2018-01-10: 500 [IU]

## 2018-01-10 MED ORDER — SODIUM CHLORIDE 0.9% FLUSH
10.0000 mL | INTRAVENOUS | Status: DC | PRN
  Administered 2018-01-10: 10 mL
  Filled 2018-01-10: qty 10

## 2018-01-10 MED ORDER — SODIUM CHLORIDE 0.9 % IV SOLN
860.0000 mg | Freq: Once | INTRAVENOUS | Status: AC
  Administered 2018-01-10: 860 mg via INTRAVENOUS
  Filled 2018-01-10: qty 7.2

## 2018-01-10 NOTE — Patient Instructions (Signed)
[facility] Discharge Instructions for Patients Receiving Chemotherapy   Beginning January 23rd 2017 lab work for the Cancer Center will be done in the  Main lab at [place] on 1st floor. If you have a lab appointment with the Cancer Center please come in thru the  Main Entrance and check in at the main information desk   Today you received the following chemotherapy agents Imfinzi  To help prevent nausea and vomiting after your treatment, we encourage you to take your nausea medication   If you develop nausea and vomiting, or diarrhea that is not controlled by your medication, call the clinic.  The clinic phone number is [phone]. Office hours are Monday-Friday 8:30am-5:00pm.  BELOW ARE SYMPTOMS THAT SHOULD BE REPORTED IMMEDIATELY:  *FEVER GREATER THAN 101.0 F  *CHILLS WITH OR WITHOUT FEVER  NAUSEA AND VOMITING THAT IS NOT CONTROLLED WITH YOUR NAUSEA MEDICATION  *UNUSUAL SHORTNESS OF BREATH  *UNUSUAL BRUISING OR BLEEDING  TENDERNESS IN MOUTH AND THROAT WITH OR WITHOUT PRESENCE OF ULCERS  *URINARY PROBLEMS  *BOWEL PROBLEMS  UNUSUAL RASH Items with * indicate a potential emergency and should be followed up as soon as possible. If you have an emergency after office hours please contact your primary care physician or go to the nearest emergency department.  Please call the clinic during office hours if you have any questions or concerns.   You may also contact the Patient Navigator at [phone] should you have any questions or need assistance in obtaining follow up care.      Resources For Cancer Patients and their Caregivers ? American Cancer Society: Can assist with transportation, wigs, general needs, runs Look Good Feel Better.        1-888-227-6333 ? Cancer Care: Provides financial assistance, online support groups, medication/co-pay assistance.  1-800-813-HOPE (4673) ? [facility] Assists [place] cancer  patients and their families through emotional , educational and financial support.  336-427-4357 ? [place] DSS Where to apply for food stamps, Medicaid and utility assistance. 336-342-1394 ? [facility]: Transportation to medical appointments. 336-347-2287 ? Social Security Administration: May apply for disability if have a Stage IV cancer. 336-342-7796 1-800-772-1213 ? [place] Aging, Disability and Transit Services: Assists with nutrition, care and transit needs. 336-349-2343          

## 2018-01-10 NOTE — Progress Notes (Signed)
Ian Aguilar tolerated Imfinzi infusion without incident or complaint. VSS upon completion of treatment. Discharged self ambulatory using cane in satisfactory condition.

## 2018-01-23 DIAGNOSIS — F17210 Nicotine dependence, cigarettes, uncomplicated: Secondary | ICD-10-CM | POA: Diagnosis not present

## 2018-01-23 DIAGNOSIS — R5383 Other fatigue: Secondary | ICD-10-CM | POA: Diagnosis not present

## 2018-01-23 DIAGNOSIS — I700 Atherosclerosis of aorta: Secondary | ICD-10-CM | POA: Diagnosis not present

## 2018-01-23 DIAGNOSIS — I2510 Atherosclerotic heart disease of native coronary artery without angina pectoris: Secondary | ICD-10-CM | POA: Diagnosis not present

## 2018-01-23 DIAGNOSIS — K7460 Unspecified cirrhosis of liver: Secondary | ICD-10-CM | POA: Diagnosis not present

## 2018-01-23 DIAGNOSIS — Z79899 Other long term (current) drug therapy: Secondary | ICD-10-CM | POA: Diagnosis not present

## 2018-01-23 DIAGNOSIS — C3411 Malignant neoplasm of upper lobe, right bronchus or lung: Secondary | ICD-10-CM | POA: Diagnosis not present

## 2018-01-28 ENCOUNTER — Encounter (HOSPITAL_COMMUNITY): Payer: Self-pay

## 2018-01-28 ENCOUNTER — Inpatient Hospital Stay (HOSPITAL_COMMUNITY): Payer: PPO | Attending: Hematology

## 2018-01-28 ENCOUNTER — Inpatient Hospital Stay (HOSPITAL_COMMUNITY): Payer: PPO

## 2018-01-28 ENCOUNTER — Other Ambulatory Visit: Payer: Self-pay

## 2018-01-28 VITALS — BP 134/74 | HR 66 | Temp 97.8°F | Resp 18 | Wt 179.0 lb

## 2018-01-28 DIAGNOSIS — Z5112 Encounter for antineoplastic immunotherapy: Secondary | ICD-10-CM | POA: Diagnosis not present

## 2018-01-28 DIAGNOSIS — C3490 Malignant neoplasm of unspecified part of unspecified bronchus or lung: Secondary | ICD-10-CM

## 2018-01-28 DIAGNOSIS — E039 Hypothyroidism, unspecified: Secondary | ICD-10-CM | POA: Insufficient documentation

## 2018-01-28 DIAGNOSIS — C3411 Malignant neoplasm of upper lobe, right bronchus or lung: Secondary | ICD-10-CM | POA: Insufficient documentation

## 2018-01-28 LAB — CBC WITH DIFFERENTIAL/PLATELET
Abs Immature Granulocytes: 0.01 10*3/uL (ref 0.00–0.07)
Basophils Absolute: 0 10*3/uL (ref 0.0–0.1)
Basophils Relative: 1 %
Eosinophils Absolute: 0.1 10*3/uL (ref 0.0–0.5)
Eosinophils Relative: 2 %
HCT: 47.7 % (ref 39.0–52.0)
Hemoglobin: 15.2 g/dL (ref 13.0–17.0)
Immature Granulocytes: 0 %
Lymphocytes Relative: 15 %
Lymphs Abs: 0.9 10*3/uL (ref 0.7–4.0)
MCH: 31.3 pg (ref 26.0–34.0)
MCHC: 31.9 g/dL (ref 30.0–36.0)
MCV: 98.4 fL (ref 80.0–100.0)
Monocytes Absolute: 0.4 10*3/uL (ref 0.1–1.0)
Monocytes Relative: 7 %
Neutro Abs: 4.7 10*3/uL (ref 1.7–7.7)
Neutrophils Relative %: 75 %
Platelets: 170 10*3/uL (ref 150–400)
RBC: 4.85 MIL/uL (ref 4.22–5.81)
RDW: 15.9 % — ABNORMAL HIGH (ref 11.5–15.5)
WBC: 6.2 10*3/uL (ref 4.0–10.5)
nRBC: 0 % (ref 0.0–0.2)

## 2018-01-28 LAB — COMPREHENSIVE METABOLIC PANEL
ALT: 20 U/L (ref 0–44)
AST: 28 U/L (ref 15–41)
Albumin: 4.5 g/dL (ref 3.5–5.0)
Alkaline Phosphatase: 84 U/L (ref 38–126)
Anion gap: 10 (ref 5–15)
BUN: 12 mg/dL (ref 8–23)
CO2: 29 mmol/L (ref 22–32)
Calcium: 9.3 mg/dL (ref 8.9–10.3)
Chloride: 100 mmol/L (ref 98–111)
Creatinine, Ser: 0.87 mg/dL (ref 0.61–1.24)
GFR calc Af Amer: >60 mL/min (ref >60)
GFR calc non Af Amer: >60 mL/min (ref >60)
Glucose, Bld: 94 mg/dL (ref 70–99)
Potassium: 3.5 mmol/L (ref 3.5–5.1)
Sodium: 139 mmol/L (ref 135–145)
Total Bilirubin: 0.9 mg/dL (ref 0.3–1.2)
Total Protein: 8.4 g/dL — ABNORMAL HIGH (ref 6.5–8.1)

## 2018-01-28 MED ORDER — SODIUM CHLORIDE 0.9 % IV SOLN
Freq: Once | INTRAVENOUS | Status: AC
  Administered 2018-01-28: 14:00:00 via INTRAVENOUS

## 2018-01-28 MED ORDER — HEPARIN SOD (PORK) LOCK FLUSH 100 UNIT/ML IV SOLN
500.0000 [IU] | Freq: Once | INTRAVENOUS | Status: AC | PRN
  Administered 2018-01-28: 500 [IU]

## 2018-01-28 MED ORDER — SODIUM CHLORIDE 0.9 % IV SOLN
860.0000 mg | Freq: Once | INTRAVENOUS | Status: AC
  Administered 2018-01-28: 860 mg via INTRAVENOUS
  Filled 2018-01-28: qty 7.2

## 2018-01-28 NOTE — Progress Notes (Signed)
Tolerated infusion w/o adverse reaction.  Alert, in no distress.  VSS.  Discharged ambulatory.  

## 2018-02-08 ENCOUNTER — Inpatient Hospital Stay (HOSPITAL_COMMUNITY): Payer: PPO

## 2018-02-08 DIAGNOSIS — Z5112 Encounter for antineoplastic immunotherapy: Secondary | ICD-10-CM | POA: Diagnosis not present

## 2018-02-08 DIAGNOSIS — C3490 Malignant neoplasm of unspecified part of unspecified bronchus or lung: Secondary | ICD-10-CM

## 2018-02-08 LAB — CBC WITH DIFFERENTIAL/PLATELET
Abs Immature Granulocytes: 0.01 10*3/uL (ref 0.00–0.07)
Basophils Absolute: 0 10*3/uL (ref 0.0–0.1)
Basophils Relative: 0 %
Eosinophils Absolute: 0.2 10*3/uL (ref 0.0–0.5)
Eosinophils Relative: 3 %
HCT: 47.8 % (ref 39.0–52.0)
Hemoglobin: 15.3 g/dL (ref 13.0–17.0)
Immature Granulocytes: 0 %
Lymphocytes Relative: 17 %
Lymphs Abs: 0.9 10*3/uL (ref 0.7–4.0)
MCH: 31.6 pg (ref 26.0–34.0)
MCHC: 32 g/dL (ref 30.0–36.0)
MCV: 98.8 fL (ref 80.0–100.0)
Monocytes Absolute: 0.4 10*3/uL (ref 0.1–1.0)
Monocytes Relative: 7 %
Neutro Abs: 3.9 10*3/uL (ref 1.7–7.7)
Neutrophils Relative %: 73 %
Platelets: 186 10*3/uL (ref 150–400)
RBC: 4.84 MIL/uL (ref 4.22–5.81)
RDW: 15.6 % — ABNORMAL HIGH (ref 11.5–15.5)
WBC: 5.4 10*3/uL (ref 4.0–10.5)
nRBC: 0 % (ref 0.0–0.2)

## 2018-02-08 LAB — COMPREHENSIVE METABOLIC PANEL
ALT: 17 U/L (ref 0–44)
AST: 29 U/L (ref 15–41)
Albumin: 4.3 g/dL (ref 3.5–5.0)
Alkaline Phosphatase: 77 U/L (ref 38–126)
Anion gap: 8 (ref 5–15)
BUN: 16 mg/dL (ref 8–23)
CO2: 31 mmol/L (ref 22–32)
Calcium: 9.4 mg/dL (ref 8.9–10.3)
Chloride: 100 mmol/L (ref 98–111)
Creatinine, Ser: 0.88 mg/dL (ref 0.61–1.24)
GFR calc Af Amer: >60 mL/min (ref >60)
GFR calc non Af Amer: >60 mL/min (ref >60)
Glucose, Bld: 86 mg/dL (ref 70–99)
Potassium: 4 mmol/L (ref 3.5–5.1)
Sodium: 139 mmol/L (ref 135–145)
Total Bilirubin: 1 mg/dL (ref 0.3–1.2)
Total Protein: 7.8 g/dL (ref 6.5–8.1)

## 2018-02-08 LAB — TSH: TSH: 34.319 u[IU]/mL — ABNORMAL HIGH (ref 0.350–4.500)

## 2018-02-11 ENCOUNTER — Inpatient Hospital Stay (HOSPITAL_COMMUNITY): Payer: PPO

## 2018-02-11 ENCOUNTER — Inpatient Hospital Stay (HOSPITAL_BASED_OUTPATIENT_CLINIC_OR_DEPARTMENT_OTHER): Payer: PPO | Admitting: Hematology

## 2018-02-11 ENCOUNTER — Encounter (HOSPITAL_COMMUNITY): Payer: Self-pay

## 2018-02-11 ENCOUNTER — Encounter (HOSPITAL_COMMUNITY): Payer: Self-pay | Admitting: Hematology

## 2018-02-11 VITALS — BP 150/65 | HR 78 | Temp 97.8°F | Resp 18 | Wt 180.4 lb

## 2018-02-11 DIAGNOSIS — E039 Hypothyroidism, unspecified: Secondary | ICD-10-CM | POA: Diagnosis not present

## 2018-02-11 DIAGNOSIS — C3490 Malignant neoplasm of unspecified part of unspecified bronchus or lung: Secondary | ICD-10-CM

## 2018-02-11 DIAGNOSIS — F17210 Nicotine dependence, cigarettes, uncomplicated: Secondary | ICD-10-CM | POA: Diagnosis not present

## 2018-02-11 DIAGNOSIS — C3411 Malignant neoplasm of upper lobe, right bronchus or lung: Secondary | ICD-10-CM | POA: Diagnosis not present

## 2018-02-11 DIAGNOSIS — G629 Polyneuropathy, unspecified: Secondary | ICD-10-CM | POA: Diagnosis not present

## 2018-02-11 DIAGNOSIS — Z5112 Encounter for antineoplastic immunotherapy: Secondary | ICD-10-CM | POA: Diagnosis not present

## 2018-02-11 DIAGNOSIS — C3491 Malignant neoplasm of unspecified part of right bronchus or lung: Secondary | ICD-10-CM

## 2018-02-11 MED ORDER — SODIUM CHLORIDE 0.9 % IV SOLN
Freq: Once | INTRAVENOUS | Status: AC
  Administered 2018-02-11: 14:00:00 via INTRAVENOUS

## 2018-02-11 MED ORDER — LEVOTHYROXINE SODIUM 125 MCG PO TABS: 125.0000 ug | ORAL_TABLET | Freq: Every day | ORAL | 1 refills | Status: DC

## 2018-02-11 MED ORDER — SODIUM CHLORIDE 0.9% FLUSH
10.0000 mL | INTRAVENOUS | Status: DC | PRN
  Administered 2018-02-11: 10 mL
  Filled 2018-02-11: qty 10

## 2018-02-11 MED ORDER — HEPARIN SOD (PORK) LOCK FLUSH 100 UNIT/ML IV SOLN
500.0000 [IU] | Freq: Once | INTRAVENOUS | Status: AC | PRN
  Administered 2018-02-11: 500 [IU]
  Filled 2018-02-11: qty 5

## 2018-02-11 MED ORDER — SODIUM CHLORIDE 0.9 % IV SOLN
860.0000 mg | Freq: Once | INTRAVENOUS | Status: AC
  Administered 2018-02-11: 860 mg via INTRAVENOUS
  Filled 2018-02-11: qty 7.2

## 2018-02-11 NOTE — Progress Notes (Signed)
Labs reviewed with Dr. Delton Coombes and ok to treat.   Patient tolerated treatment with no complaints voiced. Port site clean and dry with no bruising or swelling noted at site.  Band aid applied.  VSs with discharge and left ambulatory with no s/s of distress noted.

## 2018-02-11 NOTE — Assessment & Plan Note (Signed)
1.  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 -Chemoradiation therapy with carboplatin and paclitaxel weekly from 06/11/2017 through 07/16/2017. -CT scan of the chest dated 09/07/2017 showed 10 x 7 mm right upper lobe nodule which has not substantially changed in the interval (previously measured 10 x 8 mm).  Right paratracheal adenopathy is no longer visible.  He got partial response and was recommended immunotherapy with Durvalumab every 2 weeks for 26 treatments based on excellent results from [facility] trial. -Started on Durvalumab on 09/20/2017 and is tolerating very well. - CT CAP on 12/19/2017 showed small spiculated right upper lobe lung nodule, unchanged from most recent prior scan.  No new lung lesions.  No evidence of metastatic disease.  There is evidence of advanced cirrhosis with mild portal hypertension, prominent spleen and trace ascites. -We reviewed the blood work today.  He does not report any immunotherapy related side effects at this time.  He may proceed with his treatment today and continue it every 2 weeks. -I will see him back in 6 weeks for follow-up.  I plan to repeat another CT scan at that time.  2.  Neuropathy: -He has numbness on and off in both legs likely from paclitaxel.   3.  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  He was started on Synthroid 125 mcg 2 hours the end of August. - He ran out of Synthroid pills few days ago.  His TSH today is 34. -We have sent another refill for Synthroid.

## 2018-02-11 NOTE — Progress Notes (Signed)
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  CURRENT THERAPY: Imfinizi every 2 weeks  BRIEF ONCOLOGIC HISTORY:    Carcinoma, lung ([facility])   06/05/2017 Initial Diagnosis    Carcinoma, lung ([place] [facility])    06/05/2017 - 07/18/2017 Chemotherapy    The patient had palonosetron (ALOXI) injection 0.25 mg, 0.25 mg, Intravenous,  Once, 6 of 6 cycles Administration: 0.25 mg (06/11/2017), 0.25 mg (07/09/2017), 0.25 mg (07/16/2017), 0.25 mg (06/18/2017), 0.25 mg (06/25/2017), 0.25 mg (07/02/2017) CARBOplatin (PARAPLATIN) 190 mg in sodium chloride 0.9 % 250 mL chemo infusion, 190 mg (100 % of original dose 192.8 mg), Intravenous,  Once, 6 of 6 cycles Dose modification:   (original dose 192.8 mg, Cycle 1),   (original dose 192.8 mg, Cycle 5), 144.6 mg (original dose 144.6 mg, Cycle 6),   (original dose 192.8 mg, Cycle 2),   (original dose 192.8 mg, Cycle 3),   (original dose 192.8 mg, Cycle 4) Administration: 190 mg (06/11/2017), 190 mg (07/09/2017), 140 mg (07/16/2017), 190 mg (06/18/2017), 190 mg (06/25/2017), 190 mg (07/02/2017) PACLitaxel (TAXOL) 90 mg in dextrose 5 % 250 mL chemo infusion (</= 35m/m2), 45 mg/m2 = 90 mg, Intravenous,  Once, 6 of 6 cycles Dose modification: 40.5 mg/m2 (90 % of original dose 45 mg/m2, Cycle 5, Reason: Other (see comments), Comment: early neuropathy), 36 mg/m2 (80 % of original dose 45 mg/m2, Cycle 6, Reason: Provider Judgment) Administration: 90 mg (06/11/2017), 78 mg (07/09/2017), 72 mg (07/16/2017), 90 mg (06/18/2017), 78 mg (06/25/2017), 78 mg (07/02/2017)  for chemotherapy treatment.      Malign[place] ne[place] and lung ([facility]   06/08/2017 Initial Diagnosis    Malignant ne[place] of bronchus and lung ([facility]    09/20/2017 -  Chemotherapy    The patient had durvalumab  (IMFINZI) 860 mg in sodium chloride 0.9 % 100 mL chemo infusion, 800 mg, Intravenous,  Once, 11 of 14 cycles Administration: 860 mg (09/20/2017), 860 mg (10/04/2017), 860 mg (10/18/2017), 860 mg (11/01/2017), 860 mg (11/15/2017), 860 mg (11/29/2017), 860 mg (12/13/2017), 860 mg (12/27/2017), 860 mg (01/10/2018), 860 mg (01/28/2018)  for chemotherapy treatment.       INTERVAL HISTORY:  Mr. [id number].[id number]. 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 He is here today and tolerating treatment well. He reports the occasional fatigue and has no other complaints at this time. He remains active at home and performs all his own ADLs and activities. He denies any new pains. Denies any nausea,vomiting, or diarrhea. Denies any fevers or recent infections. Denies any bleeding or easy bruising. He denies any new cough or hemoptysis. He reports his appetite and energy level at 75%. He has no problem maintaining his weight.    REVIEW OF SYSTEMS:  Review of Systems  Constitutional: Positive for fatigue.  All other systems reviewed and are negative.    PAST MEDICAL/SURGICAL HISTORY:  Past Medical History:  Diagnosis Date  . Anxiety   . Arthritis   . Cirrhosis ([facility]    confirmed by MRI on 07/04/11.    .Marland KitchenCOPD (chronic obstructive pulmonary disease) ([facility]   . Depression with anxiety   . ETOH abuse   . Hyperlipidemia   . Nodule of upper lobe of right lung    with mediastinal adenopathy  .  Wears dentures   . Wears glasses    Past Surgical History:  Procedure Laterality Date  . bilateral cataract surgery     [place]  . broken arm  6 yrs ago   left otif of wrist-[facility]  . CLOSED REDUCTION WRIST FRACTURE Right 09/02/2015   Procedure: RIGHT WRIST REDUCTION;  Surgeon: Renette Butters, MD;  Location: [facility];  Service: Orthopedics;  Laterality: Right;  with MAC  . COLONOSCOPY  1996   [facility]: external hemorrhoids, no polyps  . COLONOSCOPY  06/28/2011   Dr. Ala Bent  diverticulosis,tubular adenoma, hyperplastic polyp  . COLONOSCOPY WITH PROPOFOL N/A 04/26/2017   Dr. Gala Romney: perianal and digital rectal examinations normal, diffusely congested colonic mucosa but otherwise normal  . ESOPHAGOGASTRODUODENOSCOPY  06/28/2011   Dr. Chelsea Aus erosive reflux esophagitis, hiatal hernia-gastritis  . ESOPHAGOGASTRODUODENOSCOPY (EGD) WITH PROPOFOL N/A 04/26/2017   Dr. Gala Romney: normal esophagus, small hiatal hernia, GAVE, portal hypertensive gastropathy, normal duodenal bulb and second portion, no specimens collected. 2 years screening   . EXTERNAL EAR SURGERY     right ear-cleaned out ear and created new eardrum  . INGUINAL HERNIA REPAIR  2-3 yrs ago   left-[facility]  . INTRAMEDULLARY (IM) NAIL INTERTROCHANTERIC Right 09/02/2015   Procedure: RIGHT  INTERTROCHANTRIC HIP;  Surgeon: Renette Butters, MD;  Location: [facility];  Service: Orthopedics;  Laterality: Right;  With MAC  . KNEE SURGERY     left-arthroscopy-[facility]  . PORTACATH PLACEMENT Left 06/08/2017   Procedure: INSERTION POWER PORT WITH  ATTACHED CATHETER LEFT SUBCLAVIAN;  Surgeon: Aviva Signs, MD;  Location: [facility];  Service: General;  Laterality: Left;  . SKULL FRACTURE ELEVATION     fractured cheeck bone repaired  . TOTAL HIP ARTHROPLASTY  12/18/2011   Procedure: TOTAL HIP ARTHROPLASTY;  Surgeon: Carole Civil, MD;  Location: [facility];  Service: Orthopedics;  Laterality: Left;  Marland Kitchen VIDEO BRONCHOSCOPY WITH ENDOBRONCHIAL ULTRASOUND N/A 05/04/2017   Procedure: VIDEO BRONCHOSCOPY WITH ENDOBRONCHIAL ULTRASOUND;  Surgeon: Melrose Nakayama, MD;  Location: [facility];  Service: Thoracic;  Laterality: N/A;  . WISDOM TOOTH EXTRACTION       SOCIAL HISTORY:  Social History   Socioeconomic History  . Marital status: Married    Spouse name: Not on file  . Number of children: Not on file  . Years of education: Not on file  . Highest education level: Not on file  Occupational History  . Occupation: retired     Fish farm manager: [employer]  Social Needs  . Financial resource strain: Not on file  . Food insecurity:    Worry: Not on file    Inability: Not on file  . Transportation needs:    Medical: Not on file    Non-medical: Not on file  Tobacco Use  . Smoking status: Current Every Day Smoker    Packs/day: 0.50    Years: 62.00    Pack years: 31.00    Types: Cigarettes  . Smokeless tobacco: Never Used  . Tobacco comment: 1/2 ppd > 60 years as of 06/27/17: 7 cigarettes or less a day  Substance and Sexual Activity  . Alcohol use: Not Currently    Comment: history of ETOH abuse, stopped 1.5 years ago (had one drink at [place] [employer]).   . Drug use: No  . Sexual activity: Yes    Birth control/protection: None  Lifestyle  . Physical activity:    Days per week: Not on file    Minutes per session: Not on file  .  Stress: Not on file  Relationships  . Social connections:    Talks on phone: Not on file    Gets together: Not on file    Attends religious service: Not on file    Active member of club or organization: Not on file    Attends meetings of clubs or organizations: Not on file    Relationship status: Not on file  . Intimate partner violence:    Fear of current or ex partner: Not on file    Emotionally abused: Not on file    Physically abused: Not on file    Forced sexual activity: Not on file  Other Topics Concern  . Not on file  Social History Narrative  . Not on file    FAMILY HISTORY:  Family History  Problem Relation Age of Onset  . Stroke Mother 84  . Heart disease Father 73       MI  . Diabetes Maternal Uncle   . Colon cancer Neg Hx   . Cancer Neg Hx     CURRENT MEDICATIONS:  Outpatient Encounter Medications as of 02/11/2018  Medication Sig  . cholecalciferol (VITAMIN D) 1000 units tablet Take 1,000 Units by mouth daily.  [place] Oris (IMFINZI IV) Inject into the vein.  . fluticasone (FLONASE) 50 MCG/ACT nasal spray PLACE 2 SPRAYS IEN QD  . levothyroxine  (SYNTHROID, LEVOTHROID) 125 MCG tablet Take 1 tablet (125 mcg total) by mouth daily before breakfast.  . meclizine (ANTIVERT) 25 MG tablet TK 1 T PO  Q 6 H PRN  . Nutritional Supplements (ENSURE ENLIVE PO) Take 237 mLs by mouth. 2-3/day  . PROAIR HFA 108 (90 Base) MCG/ACT inhaler Inhale 2 puffs into the lungs every 6 (six) hours as needed (for shortness of breath/wheezing/cough).   . Propylene Glycol-Glycerin (SOOTHE) 0.6-0.6 % SOLN Place 1-2 drops into both eyes 3 (three) times daily as needed (for dry/irritated eyes.).  Marland Kitchen Skin Protectants, Misc. (EUCERIN) cream Apply 1 application topically 4 (four) times daily as needed for dry skin (for itchy or dry skin).  . SPIRIVA HANDIHALER 18 MCG inhalation capsule INHALE CONTENTS OF 1 CAPSULE VIA HANDHALER QD  . [DISCONTINUED] levothyroxine (SYNTHROID, LEVOTHROID) 125 MCG tablet TAKE 1 TABLET(125 MCG) BY MOUTH DAILY BEFORE BREAKFAST   No facility-administered encounter medications on file as of 02/11/2018.     ALLERGIES:  No Known Allergies   PHYSICAL EXAM:  ECOG Performance status: 1  VITAL SIGNS:BP: 124/96, P:77,R:18, T:97.8, O2:95% WEIGHT: 180.4  Physical Exam Constitutional:      Appearance: Normal appearance. He is normal weight.  Musculoskeletal: Normal range of motion.  Skin:    General: Skin is warm and dry.  Neurological:     Mental Status: He is alert and oriented to person, place, and time. Mental status is at baseline.  Psychiatric:        Mood and Affect: Mood normal.        Behavior: Behavior normal.        Thought Content: Thought content normal.        Judgment: Judgment normal.      LABORATORY DATA:  I have reviewed the labs as listed.  CBC    Component Value Date/Time   WBC 5.4 02/08/2018 1047   RBC 4.84 02/08/2018 1047   HGB 15.3 02/08/2018 1047   HCT 47.8 02/08/2018 1047   PLT 186 02/08/2018 1047   MCV 98.8 02/08/2018 1047   MCH 31.6 02/08/2018 1047   MCHC 32.0 02/08/2018 1047  RDW 15.6 (H) 02/08/2018  1047   LYMPHSABS 0.9 02/08/2018 1047   MONOABS 0.4 02/08/2018 1047   EOSABS 0.2 02/08/2018 1047   BASOSABS 0.0 02/08/2018 1047   CMP Latest Ref Rng & Units 02/08/2018 01/28/2018 01/09/2018  Glucose 70 - 99 mg/dL 86 94 87  BUN 8 - 23 mg/dL _0 Creatinine 0.61 - 1.24 mg/dL 0.88 0.87 0.87  Sodium 135 - 145 mmol/L 139 139 138  Potassium 3.5 - 5.1 mmol/L 4.0 3.5 3.9  Chloride 98 - 111 mmol/L 100 100 101  CO2 22 - 32 mmol/L _1 Calcium 8.9 - 10.3 mg/dL 9.4 9.3 9.3  Total Protein 6.5 - 8.1 g/dL 7.8 8.4(H) 8.2(H)  Total Bilirubin 0.3 - 1.2 mg/dL 1.0 0.9 1.0  Alkaline Phos 38 - 126 U/L 77 84 80  AST 15 - 41 U/L _2 ALT 0 - 44 U/L _3 DIAGNOSTIC IMAGING:  I have independently reviewed the scans and discussed with the patient.   I have reviewed Francene Finders, NP's note and agree with the documentation.  I personally performed a face-to-face visit, made revisions and my assessment and plan is as follows.    ASSESSMENT & PLAN:   Malignant neoplasm of bronchus and lung ([facility]) 1.  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 -Chemoradiation therapy with carboplatin and paclitaxel weekly from 06/11/2017 through 07/16/2017. -CT scan of the chest dated 09/07/2017 showed 10 x 7 mm right upper lobe nodule which has not substantially changed in the interval (previously measured 10 x 8 mm).  Right paratracheal adenopathy is no longer visible.  He got partial response and was recommended immunotherapy with Durvalumab every 2 weeks for 26 treatments based on excellent results from [facility] trial. -Started on Durvalumab on 09/20/2017 and is tolerating very well. - CT CAP on 12/19/2017 showed small spiculated right upper lobe lung nodule,  unchanged from most recent prior scan.  No new lung lesions.  No evidence of metastatic disease.  There is evidence of advanced cirrhosis with mild portal hypertension, prominent spleen and trace ascites. -We reviewed the blood work today.  He does not report any immunotherapy related side effects at this time.  He may proceed with his treatment today and continue it every 2 weeks. -I will see him back in 6 weeks for follow-up.  I plan to repeat another CT scan at that time.  2.  Neuropathy: -He has numbness on and off in both legs likely from paclitaxel.   3.  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  He was started on Synthroid 125 mcg 2 hours the end of August. - He ran out of Synthroid pills few days ago.  His TSH today is 34. -We have sent another refill for Synthroid.      Orders placed this encounter:  Orders Placed This Encounter  Procedures  . CT Chest W Contrast  . CT Abdomen Pelvis W Contrast  . TSH  . CBC with Differential/Platelet  . Comprehensive metabolic panel      Derek Jack, MD [place] [phone]

## 2018-02-11 NOTE — Patient Instructions (Signed)
[facility] Discharge Instructions for Patients Receiving Chemotherapy  Today you received the following chemotherapy agents imfinzi, If you develop nausea and vomiting that is not controlled by your nausea medication, call the clinic.   BELOW ARE SYMPTOMS THAT SHOULD BE REPORTED IMMEDIATELY:  *FEVER GREATER THAN 100.5 F  *CHILLS WITH OR WITHOUT FEVER  NAUSEA AND VOMITING THAT IS NOT CONTROLLED WITH YOUR NAUSEA MEDICATION  *UNUSUAL SHORTNESS OF BREATH  *UNUSUAL BRUISING OR BLEEDING  TENDERNESS IN MOUTH AND THROAT WITH OR WITHOUT PRESENCE OF ULCERS  *URINARY PROBLEMS  *BOWEL PROBLEMS  UNUSUAL RASH Items with * indicate a potential emergency and should be followed up as soon as possible.  Feel free to call the clinic should you have any questions or concerns. The clinic phone number is [phone].  Please show the [facility] at check-in to the Emergency Department and triage nurse.

## 2018-02-11 NOTE — Patient Instructions (Signed)
[facility] at [facility] Discharge Instructions  Follow up in 6 weeks with labs and scans.    Thank you for choosing [facility] at [facility] to provide your oncology and hematology care.  To afford each patient quality time with our provider, please arrive at least 15 minutes before your scheduled appointment time.   If you have a lab appointment with the [facility] please come in thru the  Main Entrance and check in at the main information desk  You need to re-schedule your appointment should you arrive 10 or more minutes late.  We strive to give you quality time with our providers, and arriving late affects you and other patients whose appointments are after yours.  Also, if you no show three or more times for appointments you may be dismissed from the clinic at the providers discretion.     Again, thank you for choosing [facility].  Our hope is that these requests will decrease the amount of time that you wait before being seen by our physicians.       _____________________________________________________________  Should you have questions after your visit to [facility], please contact our office at [phone] between the hours of 8:00 a.m. and 4:30 p.m.  Voicemails left after 4:00 p.m. will not be returned until the following business day.  For prescription refill requests, have your pharmacy contact our office and allow 72 hours.    Cancer Center Support Programs:   > Cancer Support Group  2nd Tuesday of the month 1pm-2pm, Journey Room

## 2018-02-22 ENCOUNTER — Inpatient Hospital Stay (HOSPITAL_COMMUNITY): Payer: PPO

## 2018-02-22 DIAGNOSIS — Z5112 Encounter for antineoplastic immunotherapy: Secondary | ICD-10-CM | POA: Diagnosis not present

## 2018-02-22 DIAGNOSIS — C3490 Malignant neoplasm of unspecified part of unspecified bronchus or lung: Secondary | ICD-10-CM

## 2018-02-22 LAB — CBC WITH DIFFERENTIAL/PLATELET
Abs Immature Granulocytes: 0.01 10*3/uL (ref 0.00–0.07)
Basophils Absolute: 0 10*3/uL (ref 0.0–0.1)
Basophils Relative: 1 %
Eosinophils Absolute: 0.2 10*3/uL (ref 0.0–0.5)
Eosinophils Relative: 4 %
HCT: 44.7 % (ref 39.0–52.0)
Hemoglobin: 14.2 g/dL (ref 13.0–17.0)
Immature Granulocytes: 0 %
Lymphocytes Relative: 16 %
Lymphs Abs: 0.9 10*3/uL (ref 0.7–4.0)
MCH: 32.2 pg (ref 26.0–34.0)
MCHC: 31.8 g/dL (ref 30.0–36.0)
MCV: 101.4 fL — ABNORMAL HIGH (ref 80.0–100.0)
Monocytes Absolute: 0.5 10*3/uL (ref 0.1–1.0)
Monocytes Relative: 9 %
Neutro Abs: 4 10*3/uL (ref 1.7–7.7)
Neutrophils Relative %: 70 %
Platelets: 183 10*3/uL (ref 150–400)
RBC: 4.41 MIL/uL (ref 4.22–5.81)
RDW: 15.2 % (ref 11.5–15.5)
WBC: 5.6 10*3/uL (ref 4.0–10.5)
nRBC: 0 % (ref 0.0–0.2)

## 2018-02-22 LAB — COMPREHENSIVE METABOLIC PANEL WITH GFR
ALT: 19 U/L (ref 0–44)
AST: 30 U/L (ref 15–41)
Albumin: 4.1 g/dL (ref 3.5–5.0)
Alkaline Phosphatase: 68 U/L (ref 38–126)
Anion gap: 7 (ref 5–15)
BUN: 16 mg/dL (ref 8–23)
CO2: 31 mmol/L (ref 22–32)
Calcium: 8.9 mg/dL (ref 8.9–10.3)
Chloride: 101 mmol/L (ref 98–111)
Creatinine, Ser: 0.95 mg/dL (ref 0.61–1.24)
GFR calc Af Amer: >60 mL/min
GFR calc non Af Amer: >60 mL/min
Glucose, Bld: 121 mg/dL — ABNORMAL HIGH (ref 70–99)
Potassium: 4 mmol/L (ref 3.5–5.1)
Sodium: 139 mmol/L (ref 135–145)
Total Bilirubin: 0.6 mg/dL (ref 0.3–1.2)
Total Protein: 7.2 g/dL (ref 6.5–8.1)

## 2018-02-25 ENCOUNTER — Telehealth: Payer: Self-pay | Admitting: Gastroenterology

## 2018-02-25 ENCOUNTER — Inpatient Hospital Stay (HOSPITAL_COMMUNITY): Payer: PPO

## 2018-02-25 ENCOUNTER — Encounter (HOSPITAL_COMMUNITY): Payer: Self-pay

## 2018-02-25 ENCOUNTER — Other Ambulatory Visit: Payer: Self-pay

## 2018-02-25 VITALS — BP 157/75 | HR 66 | Temp 97.6°F | Resp 19 | Wt 181.2 lb

## 2018-02-25 DIAGNOSIS — Z5112 Encounter for antineoplastic immunotherapy: Secondary | ICD-10-CM | POA: Diagnosis not present

## 2018-02-25 DIAGNOSIS — C3490 Malignant neoplasm of unspecified part of unspecified bronchus or lung: Secondary | ICD-10-CM

## 2018-02-25 MED ORDER — SODIUM CHLORIDE 0.9 % IV SOLN
860.0000 mg | Freq: Once | INTRAVENOUS | Status: AC
  Administered 2018-02-25: 860 mg via INTRAVENOUS
  Filled 2018-02-25: qty 17.2

## 2018-02-25 MED ORDER — HEPARIN SOD (PORK) LOCK FLUSH 100 UNIT/ML IV SOLN: 500.0000 [IU] | Freq: Once | INTRAVENOUS | Status: DC | PRN

## 2018-02-25 MED ORDER — SODIUM CHLORIDE 0.9 % IV SOLN
Freq: Once | INTRAVENOUS | Status: AC
  Administered 2018-02-25: 14:00:00 via INTRAVENOUS

## 2018-02-25 MED ORDER — SODIUM CHLORIDE 0.9% FLUSH
10.0000 mL | INTRAVENOUS | Status: DC | PRN
  Administered 2018-02-25: 10 mL
  Filled 2018-02-25: qty 10

## 2018-02-25 NOTE — Progress Notes (Signed)
Pt presents today for Imfinzi. Labs drawn Friday. VSS. Pt has no complaints of pain or significant changes since last visit.   Treatment given today per MD orders. Tolerated infusion without adverse affects. Vital signs stable. No complaints at this time. Discharged from clinic ambulatory. F/U with [facility] as scheduled.

## 2018-02-25 NOTE — Patient Instructions (Signed)
[facility] Discharge Instructions for Patients Receiving Chemotherapy  Today you received the following chemotherapy agents   To help prevent nausea and vomiting after your treatment, we encourage you to take your nausea medication   If you develop nausea and vomiting that is not controlled by your nausea medication, call the clinic.   BELOW ARE SYMPTOMS THAT SHOULD BE REPORTED IMMEDIATELY:  *FEVER GREATER THAN 100.5 F  *CHILLS WITH OR WITHOUT FEVER  NAUSEA AND VOMITING THAT IS NOT CONTROLLED WITH YOUR NAUSEA MEDICATION  *UNUSUAL SHORTNESS OF BREATH  *UNUSUAL BRUISING OR BLEEDING  TENDERNESS IN MOUTH AND THROAT WITH OR WITHOUT PRESENCE OF ULCERS  *URINARY PROBLEMS  *BOWEL PROBLEMS  UNUSUAL RASH Items with * indicate a potential emergency and should be followed up as soon as possible.  Feel free to call the clinic should you have any questions or concerns. The clinic phone number is [phone].  Please show the CHEMO ALERT CARD at check-in to the Emergency Department and triage nurse.   

## 2018-02-25 NOTE — Telephone Encounter (Signed)
Ian Aguilar: PCP does not have documentation of Hep A/B vaccinations. Patient should have completed due to history of cirrhosis.

## 2018-02-26 NOTE — Telephone Encounter (Signed)
Lmom, waiting on a return call.  

## 2018-03-05 DIAGNOSIS — H40033 Anatomical narrow angle, bilateral: Secondary | ICD-10-CM | POA: Diagnosis not present

## 2018-03-05 DIAGNOSIS — H04123 Dry eye syndrome of bilateral lacrimal glands: Secondary | ICD-10-CM | POA: Diagnosis not present

## 2018-03-08 ENCOUNTER — Inpatient Hospital Stay (HOSPITAL_COMMUNITY): Payer: PPO | Attending: Hematology

## 2018-03-08 DIAGNOSIS — Z79899 Other long term (current) drug therapy: Secondary | ICD-10-CM | POA: Insufficient documentation

## 2018-03-08 DIAGNOSIS — G620 Drug-induced polyneuropathy: Secondary | ICD-10-CM | POA: Insufficient documentation

## 2018-03-08 DIAGNOSIS — Z9221 Personal history of antineoplastic chemotherapy: Secondary | ICD-10-CM | POA: Insufficient documentation

## 2018-03-08 DIAGNOSIS — C3490 Malignant neoplasm of unspecified part of unspecified bronchus or lung: Secondary | ICD-10-CM

## 2018-03-08 DIAGNOSIS — E039 Hypothyroidism, unspecified: Secondary | ICD-10-CM | POA: Diagnosis not present

## 2018-03-08 DIAGNOSIS — R42 Dizziness and giddiness: Secondary | ICD-10-CM | POA: Diagnosis not present

## 2018-03-08 DIAGNOSIS — Z5112 Encounter for antineoplastic immunotherapy: Secondary | ICD-10-CM | POA: Diagnosis not present

## 2018-03-08 DIAGNOSIS — C3411 Malignant neoplasm of upper lobe, right bronchus or lung: Secondary | ICD-10-CM | POA: Diagnosis not present

## 2018-03-08 DIAGNOSIS — F17210 Nicotine dependence, cigarettes, uncomplicated: Secondary | ICD-10-CM | POA: Diagnosis not present

## 2018-03-08 LAB — CBC WITH DIFFERENTIAL/PLATELET
Abs Immature Granulocytes: 0.01 10*3/uL (ref 0.00–0.07)
Basophils Absolute: 0 10*3/uL (ref 0.0–0.1)
Basophils Relative: 1 %
Eosinophils Absolute: 0.2 10*3/uL (ref 0.0–0.5)
Eosinophils Relative: 4 %
HCT: 46.6 % (ref 39.0–52.0)
Hemoglobin: 14.8 g/dL (ref 13.0–17.0)
Immature Granulocytes: 0 %
Lymphocytes Relative: 13 %
Lymphs Abs: 0.8 10*3/uL (ref 0.7–4.0)
MCH: 32.5 pg (ref 26.0–34.0)
MCHC: 31.8 g/dL (ref 30.0–36.0)
MCV: 102.2 fL — ABNORMAL HIGH (ref 80.0–100.0)
Monocytes Absolute: 0.6 10*3/uL (ref 0.1–1.0)
Monocytes Relative: 10 %
Neutro Abs: 4.3 10*3/uL (ref 1.7–7.7)
Neutrophils Relative %: 72 %
Platelets: 178 10*3/uL (ref 150–400)
RBC: 4.56 MIL/uL (ref 4.22–5.81)
RDW: 14.6 % (ref 11.5–15.5)
WBC: 5.8 10*3/uL (ref 4.0–10.5)
nRBC: 0 % (ref 0.0–0.2)

## 2018-03-08 LAB — COMPREHENSIVE METABOLIC PANEL
ALT: 21 U/L (ref 0–44)
AST: 28 U/L (ref 15–41)
Albumin: 4.1 g/dL (ref 3.5–5.0)
Alkaline Phosphatase: 75 U/L (ref 38–126)
Anion gap: 8 (ref 5–15)
BUN: 19 mg/dL (ref 8–23)
CO2: 32 mmol/L (ref 22–32)
Calcium: 9.2 mg/dL (ref 8.9–10.3)
Chloride: 101 mmol/L (ref 98–111)
Creatinine, Ser: 0.84 mg/dL (ref 0.61–1.24)
GFR calc Af Amer: >60 mL/min (ref >60)
GFR calc non Af Amer: >60 mL/min (ref >60)
Glucose, Bld: 106 mg/dL — ABNORMAL HIGH (ref 70–99)
Potassium: 4.1 mmol/L (ref 3.5–5.1)
Sodium: 141 mmol/L (ref 135–145)
Total Bilirubin: 1 mg/dL (ref 0.3–1.2)
Total Protein: 7.6 g/dL (ref 6.5–8.1)

## 2018-03-11 ENCOUNTER — Encounter (HOSPITAL_COMMUNITY): Payer: Self-pay

## 2018-03-11 ENCOUNTER — Ambulatory Visit (HOSPITAL_COMMUNITY)
Admission: RE | Admit: 2018-03-11 | Discharge: 2018-03-11 | Disposition: A | Discharge status: Home / Self Care | Payer: PPO | Source: Ambulatory Visit | Attending: Hematology | Admitting: Hematology

## 2018-03-11 ENCOUNTER — Other Ambulatory Visit: Payer: Self-pay

## 2018-03-11 ENCOUNTER — Inpatient Hospital Stay (HOSPITAL_COMMUNITY): Payer: PPO

## 2018-03-11 VITALS — BP 154/64 | HR 98 | Temp 97.8°F | Resp 19 | Wt 178.4 lb

## 2018-03-11 DIAGNOSIS — C3490 Malignant neoplasm of unspecified part of unspecified bronchus or lung: Secondary | ICD-10-CM

## 2018-03-11 DIAGNOSIS — J189 Pneumonia, unspecified organism: Secondary | ICD-10-CM | POA: Diagnosis not present

## 2018-03-11 DIAGNOSIS — R0602 Shortness of breath: Secondary | ICD-10-CM | POA: Diagnosis not present

## 2018-03-11 DIAGNOSIS — Z5112 Encounter for antineoplastic immunotherapy: Secondary | ICD-10-CM | POA: Diagnosis not present

## 2018-03-11 MED ORDER — SODIUM CHLORIDE 0.9 % IV SOLN
860.0000 mg | Freq: Once | INTRAVENOUS | Status: AC
  Administered 2018-03-11: 860 mg via INTRAVENOUS
  Filled 2018-03-11: qty 7.2

## 2018-03-11 MED ORDER — SODIUM CHLORIDE 0.9 % IV SOLN
Freq: Once | INTRAVENOUS | Status: AC
  Administered 2018-03-11: 15:00:00 via INTRAVENOUS

## 2018-03-11 MED ORDER — SODIUM CHLORIDE 0.9% FLUSH
10.0000 mL | INTRAVENOUS | Status: DC | PRN
  Administered 2018-03-11: 10 mL
  Filled 2018-03-11: qty 10

## 2018-03-11 MED ORDER — HEPARIN SOD (PORK) LOCK FLUSH 100 UNIT/ML IV SOLN
500.0000 [IU] | Freq: Once | INTRAVENOUS | Status: AC | PRN
  Administered 2018-03-11: 500 [IU]

## 2018-03-11 NOTE — Patient Instructions (Signed)
[facility] Discharge Instructions for Patients Receiving Chemotherapy  Today you received the following chemotherapy agents   To help prevent nausea and vomiting after your treatment, we encourage you to take your nausea medication   If you develop nausea and vomiting that is not controlled by your nausea medication, call the clinic.   BELOW ARE SYMPTOMS THAT SHOULD BE REPORTED IMMEDIATELY:  *FEVER GREATER THAN 100.5 F  *CHILLS WITH OR WITHOUT FEVER  NAUSEA AND VOMITING THAT IS NOT CONTROLLED WITH YOUR NAUSEA MEDICATION  *UNUSUAL SHORTNESS OF BREATH  *UNUSUAL BRUISING OR BLEEDING  TENDERNESS IN MOUTH AND THROAT WITH OR WITHOUT PRESENCE OF ULCERS  *URINARY PROBLEMS  *BOWEL PROBLEMS  UNUSUAL RASH Items with * indicate a potential emergency and should be followed up as soon as possible.  Feel free to call the clinic should you have any questions or concerns. The clinic phone number is [phone].  Please show the CHEMO ALERT CARD at check-in to the Emergency Department and triage nurse.   

## 2018-03-11 NOTE — Progress Notes (Signed)
Pt presents today for Imfinzi Day 1 Cycle 13. Sat 91%. Pt complains of SOB and using home inhaler every 4 hours at home. Albuterol Sulfate/ Proair. Labs reviewed. MAR reviewed and updated. Informed Dr. Delton Coombes of pt's complaints and sat of 91%. Stat CxR ordered. VO received to hold chemo until cxr results are back. BP elevated, pt afebrile.  Cxr reviewed with Dr. Delton Coombes. VO received may proceed with treatment at this time.   Treatment given today per MD orders. Tolerated infusion without adverse affects. Vital signs stable. No complaints at this time. Discharged from clinic ambulatory. F/U with [facility] as scheduled.

## 2018-03-19 NOTE — Telephone Encounter (Signed)
Spoke with pt. He doesn't think he had that test done. I put orders in the mail and he's going to have the hep A & B injections done.

## 2018-03-21 ENCOUNTER — Other Ambulatory Visit (HOSPITAL_COMMUNITY): Payer: Self-pay | Admitting: Nurse Practitioner

## 2018-03-21 ENCOUNTER — Inpatient Hospital Stay (HOSPITAL_COMMUNITY): Payer: PPO

## 2018-03-21 DIAGNOSIS — C3491 Malignant neoplasm of unspecified part of right bronchus or lung: Secondary | ICD-10-CM

## 2018-03-21 DIAGNOSIS — Z5112 Encounter for antineoplastic immunotherapy: Secondary | ICD-10-CM | POA: Diagnosis not present

## 2018-03-21 LAB — COMPREHENSIVE METABOLIC PANEL
ALT: 17 U/L (ref 0–44)
AST: 24 U/L (ref 15–41)
Albumin: 3.8 g/dL (ref 3.5–5.0)
Alkaline Phosphatase: 81 U/L (ref 38–126)
Anion gap: 9 (ref 5–15)
BUN: 19 mg/dL (ref 8–23)
CO2: 32 mmol/L (ref 22–32)
Calcium: 8.9 mg/dL (ref 8.9–10.3)
Chloride: 99 mmol/L (ref 98–111)
Creatinine, Ser: 0.92 mg/dL (ref 0.61–1.24)
GFR calc Af Amer: >60 mL/min (ref >60)
GFR calc non Af Amer: >60 mL/min (ref >60)
Glucose, Bld: 100 mg/dL — ABNORMAL HIGH (ref 70–99)
Potassium: 3.9 mmol/L (ref 3.5–5.1)
Sodium: 140 mmol/L (ref 135–145)
Total Bilirubin: 1 mg/dL (ref 0.3–1.2)
Total Protein: 7.5 g/dL (ref 6.5–8.1)

## 2018-03-21 LAB — CBC WITH DIFFERENTIAL/PLATELET
Abs Immature Granulocytes: 0.02 10*3/uL (ref 0.00–0.07)
Basophils Absolute: 0 10*3/uL (ref 0.0–0.1)
Basophils Relative: 1 %
Eosinophils Absolute: 0.3 10*3/uL (ref 0.0–0.5)
Eosinophils Relative: 5 %
HCT: 47.3 % (ref 39.0–52.0)
Hemoglobin: 14.5 g/dL (ref 13.0–17.0)
Immature Granulocytes: 0 %
Lymphocytes Relative: 12 %
Lymphs Abs: 0.7 10*3/uL (ref 0.7–4.0)
MCH: 30.8 pg (ref 26.0–34.0)
MCHC: 30.7 g/dL (ref 30.0–36.0)
MCV: 100.4 fL — ABNORMAL HIGH (ref 80.0–100.0)
Monocytes Absolute: 0.6 10*3/uL (ref 0.1–1.0)
Monocytes Relative: 11 %
Neutro Abs: 3.8 10*3/uL (ref 1.7–7.7)
Neutrophils Relative %: 71 %
Platelets: 187 10*3/uL (ref 150–400)
RBC: 4.71 MIL/uL (ref 4.22–5.81)
RDW: 14.1 % (ref 11.5–15.5)
WBC: 5.4 10*3/uL (ref 4.0–10.5)
nRBC: 0 % (ref 0.0–0.2)

## 2018-03-21 LAB — TSH: TSH: 0.095 u[IU]/mL — ABNORMAL LOW (ref 0.350–4.500)

## 2018-03-22 ENCOUNTER — Other Ambulatory Visit (HOSPITAL_COMMUNITY): Payer: PPO

## 2018-03-22 ENCOUNTER — Encounter (HOSPITAL_COMMUNITY): Payer: Self-pay

## 2018-03-22 ENCOUNTER — Ambulatory Visit (HOSPITAL_COMMUNITY)
Admission: RE | Admit: 2018-03-22 | Discharge: 2018-03-22 | Disposition: A | Discharge status: Home / Self Care | Payer: PPO | Source: Ambulatory Visit | Attending: Nurse Practitioner | Admitting: Nurse Practitioner

## 2018-03-22 DIAGNOSIS — C3491 Malignant neoplasm of unspecified part of right bronchus or lung: Secondary | ICD-10-CM | POA: Diagnosis not present

## 2018-03-22 DIAGNOSIS — C3411 Malignant neoplasm of upper lobe, right bronchus or lung: Secondary | ICD-10-CM | POA: Diagnosis not present

## 2018-03-22 MED ORDER — IOPAMIDOL (ISOVUE-300) INJECTION 61%
100.0000 mL | Freq: Once | INTRAVENOUS | Status: AC | PRN
  Administered 2018-03-22: 100 mL via INTRAVENOUS

## 2018-03-25 ENCOUNTER — Encounter (HOSPITAL_COMMUNITY): Payer: Self-pay | Admitting: Hematology

## 2018-03-25 ENCOUNTER — Inpatient Hospital Stay (HOSPITAL_COMMUNITY): Payer: PPO

## 2018-03-25 ENCOUNTER — Inpatient Hospital Stay (HOSPITAL_BASED_OUTPATIENT_CLINIC_OR_DEPARTMENT_OTHER): Payer: PPO | Admitting: Hematology

## 2018-03-25 VITALS — BP 155/64 | HR 76 | Temp 98.3°F | Resp 16 | Wt 180.0 lb

## 2018-03-25 VITALS — BP 151/61 | HR 77 | Temp 97.7°F | Resp 16

## 2018-03-25 DIAGNOSIS — Z9221 Personal history of antineoplastic chemotherapy: Secondary | ICD-10-CM | POA: Diagnosis not present

## 2018-03-25 DIAGNOSIS — C3490 Malignant neoplasm of unspecified part of unspecified bronchus or lung: Secondary | ICD-10-CM

## 2018-03-25 DIAGNOSIS — F17210 Nicotine dependence, cigarettes, uncomplicated: Secondary | ICD-10-CM

## 2018-03-25 DIAGNOSIS — C3411 Malignant neoplasm of upper lobe, right bronchus or lung: Secondary | ICD-10-CM | POA: Diagnosis not present

## 2018-03-25 DIAGNOSIS — G620 Drug-induced polyneuropathy: Secondary | ICD-10-CM | POA: Diagnosis not present

## 2018-03-25 DIAGNOSIS — Z79899 Other long term (current) drug therapy: Secondary | ICD-10-CM

## 2018-03-25 DIAGNOSIS — R42 Dizziness and giddiness: Secondary | ICD-10-CM | POA: Diagnosis not present

## 2018-03-25 DIAGNOSIS — Z5112 Encounter for antineoplastic immunotherapy: Secondary | ICD-10-CM | POA: Diagnosis not present

## 2018-03-25 DIAGNOSIS — C3491 Malignant neoplasm of unspecified part of right bronchus or lung: Secondary | ICD-10-CM

## 2018-03-25 DIAGNOSIS — E039 Hypothyroidism, unspecified: Secondary | ICD-10-CM | POA: Diagnosis not present

## 2018-03-25 MED ORDER — LEVOTHYROXINE SODIUM 50 MCG PO TABS: 50.0000 ug | ORAL_TABLET | Freq: Every day | ORAL | 2 refills | Status: DC

## 2018-03-25 MED ORDER — SODIUM CHLORIDE 0.9% FLUSH
10.0000 mL | INTRAVENOUS | Status: DC | PRN
  Administered 2018-03-25: 10 mL
  Filled 2018-03-25: qty 10

## 2018-03-25 MED ORDER — SODIUM CHLORIDE 0.9 % IV SOLN
Freq: Once | INTRAVENOUS | Status: AC
  Administered 2018-03-25: 11:00:00 via INTRAVENOUS

## 2018-03-25 MED ORDER — HEPARIN SOD (PORK) LOCK FLUSH 100 UNIT/ML IV SOLN
500.0000 [IU] | Freq: Once | INTRAVENOUS | Status: AC | PRN
  Administered 2018-03-25: 500 [IU]

## 2018-03-25 MED ORDER — SODIUM CHLORIDE 0.9 % IV SOLN
860.0000 mg | Freq: Once | INTRAVENOUS | Status: AC
  Administered 2018-03-25: 860 mg via INTRAVENOUS
  Filled 2018-03-25: qty 7.2

## 2018-03-25 NOTE — Assessment & Plan Note (Signed)
1.  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 -Chemoradiation therapy with carboplatin and paclitaxel weekly from 06/11/2017 through 07/16/2017. -CT scan of the chest dated 09/07/2017 showed 10 x 7 mm right upper lobe nodule which has not substantially changed in the interval (previously measured 10 x 8 mm).  Right paratracheal adenopathy is no longer visible.  Ian Aguilar got partial response and was recommended immunotherapy with Durvalumab every 2 weeks for 26 treatments based on excellent results from [facility] trial. -Durvalumab consolidation started on 09/20/2017. - CT CAP on 12/19/2017 showed small spiculated right upper lobe lung nodule, unchanged from prior scan.  No new lung lesions.  No evidence of metastatic disease.  There is evidence of advanced cirrhosis with mild portal hypertension, prominent spleen and trace ascites. - We reviewed results of the CT CAP on 03/22/2018 which showed stable appearance of the spiculated right apical nodule, although some new hazy interstitial accentuation in the right upper lobe in the vicinity of this nodule, could be from interval radiation therapy.  3 mm nodule in the right lower lobe is new, could be inflammatory.  Hepatic cirrhosis with ascites.  Old wedge compression fracture at L1. - We reviewed Ian Aguilar labs.  Ian Aguilar may proceed with next dose of durvalumab. - Ian Aguilar complains of some problems with balance and some dizziness.  I think it is most likely related to Ian Aguilar numbness.  However we plan to repeat an MRI of the brain with and without contrast. -I will see him back in 6 weeks for follow-up.  2.  Neuropathy: -Ian Aguilar has numbness on and off in both legs from paclitaxel.  3.  Hypothyroidism: -Ian Aguilar was started on Synthroid 125 mcg. -Ian Aguilar TSH was  low at 0.09.  We will decrease the Synthroid to 50 mcg.

## 2018-03-25 NOTE — Patient Instructions (Signed)
[facility] at [facility] Discharge Instructions     Thank you for choosing [facility] at [facility] to provide your oncology and hematology care.  To afford each patient quality time with our provider, please arrive at least 15 minutes before your scheduled appointment time.   If you have a lab appointment with the Cancer Center please come in thru the  Main Entrance and check in at the main information desk  You need to re-schedule your appointment should you arrive 10 or more minutes late.  We strive to give you quality time with our providers, and arriving late affects you and other patients whose appointments are after yours.  Also, if you no show three or more times for appointments you may be dismissed from the clinic at the providers discretion.     Again, thank you for choosing [facility].  Our hope is that these requests will decrease the amount of time that you wait before being seen by our physicians.       _____________________________________________________________  Should you have questions after your visit to  Cancer Center, please contact our office at [phone] between the hours of 8:00 a.m. and 4:30 p.m.  Voicemails left after 4:00 p.m. will not be returned until the following business day.  For prescription refill requests, have your pharmacy contact our office and allow 72 hours.    Cancer Center Support Programs:   > Cancer Support Group  2nd Tuesday of the month 1pm-2pm, Journey Room    

## 2018-03-25 NOTE — Patient Instructions (Signed)
[facility] Discharge Instructions for Patients Receiving Chemotherapy  Today you received the following chemotherapy agents  If you develop nausea and vomiting that is not controlled by your nausea medication, call the clinic.   BELOW ARE SYMPTOMS THAT SHOULD BE REPORTED IMMEDIATELY:  *FEVER GREATER THAN 100.5 F  *CHILLS WITH OR WITHOUT FEVER  NAUSEA AND VOMITING THAT IS NOT CONTROLLED WITH YOUR NAUSEA MEDICATION  *UNUSUAL SHORTNESS OF BREATH  *UNUSUAL BRUISING OR BLEEDING  TENDERNESS IN MOUTH AND THROAT WITH OR WITHOUT PRESENCE OF ULCERS  *URINARY PROBLEMS  *BOWEL PROBLEMS  UNUSUAL RASH Items with * indicate a potential emergency and should be followed up as soon as possible.  Feel free to call the clinic should you have any questions or concerns. The clinic phone number is [phone].  Please show the CHEMO ALERT CARD at check-in to the Emergency Department and triage nurse.   

## 2018-03-25 NOTE — Progress Notes (Signed)
Ian Aguilar, [name] [id number]   CLINIC:  Medical Oncology/Hematology  PCP:  Sharilyn Sites, MD [address] 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  CURRENT THERAPY: Imfinizi every 2 weeks  BRIEF ONCOLOGIC HISTORY:    Carcinoma, lung ([place])   06/05/2017 Initial Diagnosis    Carcinoma, lung ([place])    06/05/2017 - 07/18/2017 Chemotherapy    The patient had palonosetron (ALOXI) injection 0.25 mg, 0.25 mg, Intravenous,  Once, 6 of 6 cycles Administration: 0.25 mg (06/11/2017), 0.25 mg (07/09/2017), 0.25 mg (07/16/2017), 0.25 mg (06/18/2017), 0.25 mg (06/25/2017), 0.25 mg (07/02/2017) CARBOplatin (PARAPLATIN) 190 mg in sodium chloride 0.9 % 250 mL chemo infusion, 190 mg (100 % of original dose 192.8 mg), Intravenous,  Once, 6 of 6 cycles Dose modification:   (original dose 192.8 mg, Cycle 1),   (original dose 192.8 mg, Cycle 5), 144.6 mg (original dose 144.6 mg, Cycle 6),   (original dose 192.8 mg, Cycle 2),   (original dose 192.8 mg, Cycle 3),   (original dose 192.8 mg, Cycle 4) Administration: 190 mg (06/11/2017), 190 mg (07/09/2017), 140 mg (07/16/2017), 190 mg (06/18/2017), 190 mg (06/25/2017), 190 mg (07/02/2017) PACLitaxel (TAXOL) 90 mg in dextrose 5 % 250 mL chemo infusion (</= 60m/m2), 45 mg/m2 = 90 mg, Intravenous,  Once, 6 of 6 cycles Dose modification: 40.5 mg/m2 (90 % of original dose 45 mg/m2, Cycle 5, Reason: Other (see comments), Comment: early neuropathy), 36 mg/m2 (80 % of original dose 45 mg/m2, Cycle 6, Reason: Provider Judgment) Administration: 90 mg (06/11/2017), 78 mg (07/09/2017), 72 mg (07/16/2017), 90 mg (06/18/2017), 78 mg (06/25/2017), 78 mg (07/02/2017)  for chemotherapy treatment.      [place]   06/08/2017 Initial Diagnosis    Malignant [facility] of [facility]us and lung ([facility]    09/20/2017 -  Chemotherapy    The patient had durvalumab  (IMFINZI) 860 mg in sodium chloride 0.9 % 100 mL chemo infusion, 800 mg, Intravenous,  Once, 14 of 24 cycles Administration: 860 mg (09/20/2017), 860 mg (10/04/2017), 860 mg (10/18/2017), 860 mg (11/01/2017), 860 mg (11/15/2017), 860 mg (11/29/2017), 860 mg (12/13/2017), 860 mg (12/27/2017), 860 mg (01/10/2018), 860 mg (01/28/2018), 860 mg (02/11/2018), 860 mg (02/25/2018), 860 mg (03/11/2018)  for chemotherapy treatment.        INTERVAL HISTORY:  [name]89y.o. 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 He does report some SOB with exertion. He is having fatigue during the day and is unable ot perform many activities. He does have balance problems. He feels it is part due to the numbness in his feet. Sometime he has dizziness. He walks with a cane and that helps some. He uses Meclizine and it helps some as well. Denies any nausea, vomiting, or diarrhea. Denies any new pains. Had not noticed any recent bleeding such as epistaxis, hematuria or hematochezia. Denies recent chest pain on exertion, shortness of breath on minimal exertion, pre-syncopal episodes, or palpitations. Denies any numbness or tingling in hands or feet. Denies any recent fevers, infections, or recent hospitalizations. Patient reports appetite at 100% and energy level at 75%.   REVIEW OF SYSTEMS:  Review of Systems  Constitutional: Positive for fatigue.  Respiratory: Positive for cough and shortness of breath.   Musculoskeletal: Positive for arthralgias and gait problem.  Neurological: Positive for gait problem.  All other  systems reviewed and are negative.    PAST MEDICAL/SURGICAL HISTORY:  Past Medical History:  Diagnosis Date  . Anxiety   . Arthritis   . Cirrhosis ([place])    confirmed by MRI on 07/04/11.    Marland Kitchen COPD (chronic obstructive pulmonary disease) ([place])   . Depression with anxiety   . ETOH abuse   . Hyperlipidemia   . Nodule of upper lobe of right lung    with mediastinal  adenopathy  . Wears dentures   . Wears glasses    Past Surgical History:  Procedure Laterality Date  . bilateral cataract surgery     [place]  . broken arm  6 yrs ago   left otif of wrist-[place]  . CLOSED REDUCTION WRIST FRACTURE Right 09/02/2015   Procedure: RIGHT WRIST REDUCTION;  Surgeon: Renette Butters, MD;  Location: [facility];  Service: Orthopedics;  Laterality: Right;  with MAC  . COLONOSCOPY  1996   [place]: external hemorrhoids, no polyps  . COLONOSCOPY  06/28/2011   Dr. Ala Bent diverticulosis,tubular adenoma, hyperplastic polyp  . COLONOSCOPY WITH PROPOFOL N/A 04/26/2017   Dr. Gala Romney: perianal and digital rectal examinations normal, diffusely congested colonic mucosa but otherwise normal  . ESOPHAGOGASTRODUODENOSCOPY  06/28/2011   Dr. Chelsea Aus erosive reflux esophagitis, hiatal hernia-gastritis  . ESOPHAGOGASTRODUODENOSCOPY (EGD) WITH PROPOFOL N/A 04/26/2017   Dr. Gala Romney: normal esophagus, small hiatal hernia, GAVE, portal hypertensive gastropathy, normal duodenal bulb and second portion, no specimens collected. 2 years screening   . EXTERNAL EAR SURGERY     right ear-cleaned out ear and created new eardrum  . INGUINAL HERNIA REPAIR  2-3 yrs ago   left-[place]_  . INTRAMEDULLARY (IM) NAIL INTERTROCHANTERIC Right 09/02/2015   Procedure: RIGHT  INTERTROCHANTRIC HIP;  Surgeon: Renette Butters, MD;  Location: [facility];  Service: Orthopedics;  Laterality: Right;  With MAC  . KNEE SURGERY     left-arthroscopy-[place]  . PORTACATH PLACEMENT Left 06/08/2017   Procedure: INSERTION POWER PORT WITH  ATTACHED CATHETER LEFT SUBCLAVIAN;  Surgeon: Aviva Signs, MD;  Location: [facility];  Service: General;  Laterality: Left;  . SKULL FRACTURE ELEVATION     fractured cheeck bone repaired  . TOTAL HIP ARTHROPLASTY  12/18/2011   Procedure: TOTAL HIP ARTHROPLASTY;  Surgeon: Carole Civil, MD;  Location: [facility];  Service: Orthopedics;  Laterality: Left;  Marland Kitchen VIDEO BRONCHOSCOPY WITH  ENDOBRONCHIAL ULTRASOUND N/A 05/04/2017   Procedure: VIDEO BRONCHOSCOPY WITH ENDOBRONCHIAL ULTRASOUND;  Surgeon: Melrose Nakayama, MD;  Location: [facility];  Service: Thoracic;  Laterality: N/A;  . WISDOM TOOTH EXTRACTION       SOCIAL HISTORY:  Social History   Socioeconomic History  . Marital status: Married    Spouse name: Not on file  . Number of children: Not on file  . Years of education: Not on file  . Highest education level: Not on file  Occupational History  . Occupation: retired    Fish farm manager: [employer]  Social Needs  . Financial resource strain: Not on file  . Food insecurity:    Worry: Not on file    Inability: Not on file  . Transportation needs:    Medical: Not on file    Non-medical: Not on file  Tobacco Use  . Smoking status: Current Every Day Smoker    Packs/day: 0.50    Years: 62.00    Pack years: 31.00    Types: Cigarettes  . Smokeless tobacco: Never Used  . Tobacco comment: 1/2 ppd > 60 years  as of 06/27/17: 7 cigarettes or less a day  Substance and Sexual Activity  . Alcohol use: Not Currently    Comment: history of ETOH abuse, stopped 1.5 years ago (had one drink at [place] [employer]).   . Drug use: No  . Sexual activity: Yes    Birth control/protection: None  Lifestyle  . Physical activity:    Days per week: Not on file    Minutes per session: Not on file  . Stress: Not on file  Relationships  . Social connections:    Talks on phone: Not on file    Gets together: Not on file    Attends religious service: Not on file    Active member of club or organization: Not on file    Attends meetings of clubs or organizations: Not on file    Relationship status: Not on file  . Intimate partner violence:    Fear of current or ex partner: Not on file    Emotionally abused: Not on file    Physically abused: Not on file    Forced sexual activity: Not on file  Other Topics Concern  . Not on file  Social History Narrative  . Not on file     FAMILY HISTORY:  Family History  Problem Relation Age of Onset  . Stroke Mother 75  . Heart disease Father 15       MI  . Diabetes Maternal Uncle   . Colon cancer Neg Hx   . Cancer Neg Hx     CURRENT MEDICATIONS:  Outpatient Encounter Medications as of 03/25/2018  Medication Sig  . cholecalciferol (VITAMIN D) 1000 units tablet Take 1,000 Units by mouth daily.  [place] (IMFINZI IV) Inject into the vein.  . fluticasone (FLONASE) 50 MCG/ACT nasal spray PLACE 2 SPRAYS IEN QD  . levothyroxine (SYNTHROID, LEVOTHROID) 125 MCG tablet Take 1 tablet (125 mcg total) by mouth daily before breakfast.  . meclizine (ANTIVERT) 25 MG tablet TK 1 T PO  Q 6 H PRN  . Nutritional Supplements (ENSURE ENLIVE PO) Take 237 mLs by mouth. 2-3/day  . PROAIR HFA 108 (90 Base) MCG/ACT inhaler Inhale 2 puffs into the lungs every 6 (six) hours as needed (for shortness of breath/wheezing/cough).   . Propylene Glycol-Glycerin (SOOTHE) 0.6-0.6 % SOLN Place 1-2 drops into both eyes 3 (three) times daily as needed (for dry/irritated eyes.).  Marland Kitchen SPIRIVA HANDIHALER 18 MCG inhalation capsule INHALE CONTENTS OF 1 CAPSULE VIA HANDHALER QD  . Skin Protectants, Misc. (EUCERIN) cream Apply 1 application topically 4 (four) times daily as needed for dry skin (for itchy or dry skin).   No facility-administered encounter medications on file as of 03/25/2018.     ALLERGIES:  No Known Allergies   PHYSICAL EXAM:  ECOG Performance status: 1  Vitals:   03/25/18 1046  BP: (!) 155/64  Pulse: 76  Resp: 16  Temp: 98.3 F (36.8 C)  SpO2: 91%   Filed Weights   03/25/18 1046  Weight: 180 lb (81.6 kg)    Physical Exam Constitutional:      Appearance: Normal appearance. He is normal weight.  Cardiovascular:     Rate and Rhythm: Normal rate and regular rhythm.     Heart sounds: Normal heart sounds.  Pulmonary:     Effort: Pulmonary effort is normal.     Breath sounds: Normal breath sounds.  Musculoskeletal: Normal  range of motion.  Skin:    General: Skin is warm and dry.  Neurological:  Mental Status: He is alert and oriented to person, place, and time. Mental status is at baseline.  Psychiatric:        Mood and Affect: Mood normal.        Behavior: Behavior normal.        Thought Content: Thought content normal.        Judgment: Judgment normal.   Bilateral clear to auscultation of the lungs.   LABORATORY DATA:  I have reviewed the labs as listed.  CBC    Component Value Date/Time   WBC 5.4 03/21/2018 1217   RBC 4.71 03/21/2018 1217   HGB 14.5 03/21/2018 1217   HCT 47.3 03/21/2018 1217   PLT 187 03/21/2018 1217   MCV 100.4 (H) 03/21/2018 1217   MCH 30.8 03/21/2018 1217   MCHC 30.7 03/21/2018 1217   RDW 14.1 03/21/2018 1217   LYMPHSABS 0.7 03/21/2018 1217   MONOABS 0.6 03/21/2018 1217   EOSABS 0.3 03/21/2018 1217   BASOSABS 0.0 03/21/2018 1217   CMP Latest Ref Rng & Units 03/21/2018 03/08/2018 02/22/2018  Glucose 70 - 99 mg/dL 100(H) 106(H) 121(H)  BUN 8 - 23 mg/dL 19 19 16   Creatinine 0.61 - 1.24 mg/dL 0.92 0.84 0.95  Sodium 135 - 145 mmol/L 140 141 139  Potassium 3.5 - 5.1 mmol/L 3.9 4.1 4.0  Chloride 98 - 111 mmol/L 99 101 101  CO2 22 - 32 mmol/L 32 32 31  Calcium 8.9 - 10.3 mg/dL 8.9 9.2 8.9  Total Protein 6.5 - 8.1 g/dL 7.5 7.6 7.2  Total Bilirubin 0.3 - 1.2 mg/dL 1.0 1.0 0.6  Alkaline Phos 38 - 126 U/L 81 75 68  AST 15 - 41 U/L 24 28 30   ALT 0 - 44 U/L 17 21 19        DIAGNOSTIC IMAGING:  I have independently reviewed the scans and discussed with the patient.   I have reviewed Francene Finders, NP's note and agree with the documentation.  I personally performed a face-to-face visit, made revisions and my assessment and plan is as follows.    ASSESSMENT & PLAN:   Malignant neoplasm of bronchus and lung (HCC) 1.  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 -Chemoradiation therapy with carboplatin and paclitaxel weekly from 06/11/2017 through 07/16/2017. -CT scan of the chest dated 09/07/2017 showed 10 x 7 mm right upper lobe nodule which has not substantially changed in the interval (previously measured 10 x 8 mm).  Right paratracheal adenopathy is no longer visible.  He got partial response and was recommended immunotherapy with Durvalumab every 2 weeks for 26 treatments based on excellent results from [facility] trial. -Durvalumab consolidation started on 09/20/2017. - CT CAP on 12/19/2017 showed small spiculated right upper lobe lung nodule, unchanged from prior scan.  No new lung lesions.  No evidence of metastatic disease.  There is evidence of advanced cirrhosis with mild portal hypertension, prominent spleen and trace ascites. - We reviewed results of the CT CAP on 03/22/2018 which showed stable appearance of the spiculated right apical nodule, although some new hazy interstitial accentuation in the right upper lobe in the vicinity of this nodule, could be from interval radiation therapy.  3 mm nodule in the right lower lobe is new, could be inflammatory.  Hepatic cirrhosis with ascites.  Old wedge compression fracture at L1. - We reviewed his labs.  He may proceed with next dose of durvalumab. - He complains of some problems with balance and some dizziness.  I think it is most likely related to his numbness.  However we plan to repeat an MRI of the brain with and without contrast. -I will see him back in 6 weeks for follow-up.  2.  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  3.  Hypothyroidism: -He was started on Synthroid 125 mcg. -His TSH was low at 0.09.  We will decrease the Synthroid to 50 mcg.       Orders placed this encounter:  Orders Placed This Encounter  Procedures  . MR Brain W  Wo Contrast  . TSH  . CBC with Differential/Platelet  . Comprehensive metabolic panel      Derek Jack, MD [place] [phone]

## 2018-03-25 NOTE — Progress Notes (Signed)
Patient seen by the oncologist and ok to treat today.  Labs reviewed.   Patient tolerated chemotherapy with no complaints voiced.  Port site clean and dry with no bruising or swelling noted at site.  Good blood return noted before and after administration of chemotherapy.  Band aid applied.  Patient left ambulatory with VSS and no s/s of distress noted.

## 2018-04-05 ENCOUNTER — Inpatient Hospital Stay (HOSPITAL_COMMUNITY): Payer: PPO | Attending: Hematology

## 2018-04-05 DIAGNOSIS — C3411 Malignant neoplasm of upper lobe, right bronchus or lung: Secondary | ICD-10-CM | POA: Insufficient documentation

## 2018-04-05 DIAGNOSIS — Z5112 Encounter for antineoplastic immunotherapy: Secondary | ICD-10-CM | POA: Diagnosis not present

## 2018-04-05 DIAGNOSIS — C3491 Malignant neoplasm of unspecified part of right bronchus or lung: Secondary | ICD-10-CM

## 2018-04-05 DIAGNOSIS — Z79899 Other long term (current) drug therapy: Secondary | ICD-10-CM | POA: Diagnosis not present

## 2018-04-05 LAB — CBC WITH DIFFERENTIAL/PLATELET
Abs Immature Granulocytes: 0.02 10*3/uL (ref 0.00–0.07)
Basophils Absolute: 0 10*3/uL (ref 0.0–0.1)
Basophils Relative: 0 %
Eosinophils Absolute: 0.3 10*3/uL (ref 0.0–0.5)
Eosinophils Relative: 6 %
HCT: 45.9 % (ref 39.0–52.0)
Hemoglobin: 14.4 g/dL (ref 13.0–17.0)
Immature Granulocytes: 0 %
Lymphocytes Relative: 13 %
Lymphs Abs: 0.6 10*3/uL — ABNORMAL LOW (ref 0.7–4.0)
MCH: 30.5 pg (ref 26.0–34.0)
MCHC: 31.4 g/dL (ref 30.0–36.0)
MCV: 97.2 fL (ref 80.0–100.0)
Monocytes Absolute: 0.6 10*3/uL (ref 0.1–1.0)
Monocytes Relative: 11 %
Neutro Abs: 3.5 10*3/uL (ref 1.7–7.7)
Neutrophils Relative %: 70 %
Platelets: 231 10*3/uL (ref 150–400)
RBC: 4.72 MIL/uL (ref 4.22–5.81)
RDW: 13.7 % (ref 11.5–15.5)
WBC: 5 10*3/uL (ref 4.0–10.5)
nRBC: 0 % (ref 0.0–0.2)

## 2018-04-05 LAB — COMPREHENSIVE METABOLIC PANEL
ALT: 12 U/L (ref 0–44)
AST: 21 U/L (ref 15–41)
Albumin: 3.7 g/dL (ref 3.5–5.0)
Alkaline Phosphatase: 71 U/L (ref 38–126)
Anion gap: 9 (ref 5–15)
BUN: 17 mg/dL (ref 8–23)
CO2: 30 mmol/L (ref 22–32)
Calcium: 8.8 mg/dL — ABNORMAL LOW (ref 8.9–10.3)
Chloride: 98 mmol/L (ref 98–111)
Creatinine, Ser: 0.98 mg/dL (ref 0.61–1.24)
GFR calc Af Amer: >60 mL/min (ref >60)
GFR calc non Af Amer: >60 mL/min (ref >60)
Glucose, Bld: 92 mg/dL (ref 70–99)
Potassium: 4.1 mmol/L (ref 3.5–5.1)
Sodium: 137 mmol/L (ref 135–145)
Total Bilirubin: 0.8 mg/dL (ref 0.3–1.2)
Total Protein: 7.3 g/dL (ref 6.5–8.1)

## 2018-04-05 LAB — TSH: TSH: 5.226 u[IU]/mL — ABNORMAL HIGH (ref 0.350–4.500)

## 2018-04-08 ENCOUNTER — Encounter (HOSPITAL_COMMUNITY): Payer: Self-pay

## 2018-04-08 ENCOUNTER — Inpatient Hospital Stay (HOSPITAL_COMMUNITY): Payer: PPO

## 2018-04-08 VITALS — BP 130/77 | HR 66 | Temp 98.0°F | Resp 18 | Wt 175.6 lb

## 2018-04-08 DIAGNOSIS — C3490 Malignant neoplasm of unspecified part of unspecified bronchus or lung: Secondary | ICD-10-CM

## 2018-04-08 DIAGNOSIS — Z5112 Encounter for antineoplastic immunotherapy: Secondary | ICD-10-CM | POA: Diagnosis not present

## 2018-04-08 MED ORDER — SODIUM CHLORIDE 0.9 % IV SOLN
Freq: Once | INTRAVENOUS | Status: AC
  Administered 2018-04-08: 13:00:00 via INTRAVENOUS

## 2018-04-08 MED ORDER — HEPARIN SOD (PORK) LOCK FLUSH 100 UNIT/ML IV SOLN
500.0000 [IU] | Freq: Once | INTRAVENOUS | Status: AC | PRN
  Administered 2018-04-08: 500 [IU]

## 2018-04-08 MED ORDER — SODIUM CHLORIDE 0.9% FLUSH
10.0000 mL | INTRAVENOUS | Status: DC | PRN
  Administered 2018-04-08: 10 mL
  Filled 2018-04-08: qty 10

## 2018-04-08 MED ORDER — SODIUM CHLORIDE 0.9 % IV SOLN
860.0000 mg | Freq: Once | INTRAVENOUS | Status: AC
  Administered 2018-04-08: 860 mg via INTRAVENOUS
  Filled 2018-04-08: qty 7.2

## 2018-04-08 NOTE — Patient Instructions (Signed)
[facility] Discharge Instructions for Patients Receiving Chemotherapy   Beginning January 23rd 2017 lab work for the [facility] will be done in the  Main lab at [facility] on 1st floor. If you have a lab appointment with the [facility] please come in thru the  Main Entrance and check in at the main information desk   Today you received the following chemotherapy agents Imfinzi. Follow-up as scheduled. Call clinic for any questions or concerns  To help prevent nausea and vomiting after your treatment, we encourage you to take your nausea medication   If you develop nausea and vomiting, or diarrhea that is not controlled by your medication, call the clinic.  The clinic phone number is [phone]. Office hours are Monday-Friday 8:30am-5:00pm.  BELOW ARE SYMPTOMS THAT SHOULD BE REPORTED IMMEDIATELY:  *FEVER GREATER THAN 101.0 F  *CHILLS WITH OR WITHOUT FEVER  NAUSEA AND VOMITING THAT IS NOT CONTROLLED WITH YOUR NAUSEA MEDICATION  *UNUSUAL SHORTNESS OF BREATH  *UNUSUAL BRUISING OR BLEEDING  TENDERNESS IN MOUTH AND THROAT WITH OR WITHOUT PRESENCE OF ULCERS  *URINARY PROBLEMS  *BOWEL PROBLEMS  UNUSUAL RASH Items with * indicate a potential emergency and should be followed up as soon as possible. If you have an emergency after office hours please contact your primary care physician or go to the nearest emergency department.  Please call the clinic during office hours if you have any questions or concerns.   You may also contact the Patient Navigator at [phone] should you have any questions or need assistance in obtaining follow up care.      Resources For Cancer Patients and their Caregivers ? American Cancer Society: Can assist with transportation, wigs, general needs, runs Look Good Feel Better.        769 235 8261 ? Cancer Care: Provides financial assistance, online support groups, medication/co-pay assistance.  1-800-813-HOPE  305-562-7817) ? [facility] Assists [place] cancer patients and their families through emotional , educational and financial support.  6146714500 ? [place] DSS Where to apply for food stamps, Medicaid and utility assistance. 651-561-3409 ? [facility]: Transportation to medical appointments. (647) 596-9177 ? Social Security Administration: May apply for disability if have a Stage IV cancer. 408-012-7571 231-377-2845 ? [employer], Disability and Transit Services: Assists with nutrition, care and transit needs. [phone]

## 2018-04-08 NOTE — Progress Notes (Signed)
Ian Aguilar tolerated Imfinzi infusion well without complaints or incident. Lab results,including TSH level of 5.226, reviewed with Dr. Delton Coombes prior to administering this medication. VSS upon dsicharge. Pt discharged via wheelchair in satisfactory condition accompanied by his wife

## 2018-04-08 NOTE — Progress Notes (Signed)
Reports dizziness onset this morning.  Pt has hx of vertigo, and states this dizziness he is experiencing is indicative of his vertigo.  Reports he did not take his Rx meclizine as he was afraid it would interfere with him rec'ing his tx today.  Informed Dr. Delton Coombes of the above - okay to proceed with tx today per MD.

## 2018-04-19 ENCOUNTER — Inpatient Hospital Stay (HOSPITAL_COMMUNITY): Payer: PPO

## 2018-04-19 ENCOUNTER — Other Ambulatory Visit (HOSPITAL_COMMUNITY): Payer: Self-pay | Admitting: Nurse Practitioner

## 2018-04-19 DIAGNOSIS — Z5112 Encounter for antineoplastic immunotherapy: Secondary | ICD-10-CM | POA: Diagnosis not present

## 2018-04-19 DIAGNOSIS — C3491 Malignant neoplasm of unspecified part of right bronchus or lung: Secondary | ICD-10-CM

## 2018-04-19 DIAGNOSIS — C3490 Malignant neoplasm of unspecified part of unspecified bronchus or lung: Secondary | ICD-10-CM

## 2018-04-19 LAB — COMPREHENSIVE METABOLIC PANEL
ALT: 12 U/L (ref 0–44)
AST: 21 U/L (ref 15–41)
Albumin: 3.8 g/dL (ref 3.5–5.0)
Alkaline Phosphatase: 75 U/L (ref 38–126)
Anion gap: 8 (ref 5–15)
BUN: 17 mg/dL (ref 8–23)
CO2: 30 mmol/L (ref 22–32)
Calcium: 9.1 mg/dL (ref 8.9–10.3)
Chloride: 100 mmol/L (ref 98–111)
Creatinine, Ser: 1.05 mg/dL (ref 0.61–1.24)
GFR calc Af Amer: >60 mL/min (ref >60)
GFR calc non Af Amer: >60 mL/min (ref >60)
Glucose, Bld: 97 mg/dL (ref 70–99)
Potassium: 4 mmol/L (ref 3.5–5.1)
Sodium: 138 mmol/L (ref 135–145)
Total Bilirubin: 0.5 mg/dL (ref 0.3–1.2)
Total Protein: 7.2 g/dL (ref 6.5–8.1)

## 2018-04-19 LAB — CBC WITH DIFFERENTIAL/PLATELET
Abs Immature Granulocytes: 0.01 10*3/uL (ref 0.00–0.07)
Basophils Absolute: 0 10*3/uL (ref 0.0–0.1)
Basophils Relative: 1 %
Eosinophils Absolute: 0.2 10*3/uL (ref 0.0–0.5)
Eosinophils Relative: 4 %
HCT: 48.3 % (ref 39.0–52.0)
Hemoglobin: 15.2 g/dL (ref 13.0–17.0)
Immature Granulocytes: 0 %
Lymphocytes Relative: 15 %
Lymphs Abs: 0.7 10*3/uL (ref 0.7–4.0)
MCH: 30.6 pg (ref 26.0–34.0)
MCHC: 31.5 g/dL (ref 30.0–36.0)
MCV: 97.2 fL (ref 80.0–100.0)
Monocytes Absolute: 0.3 10*3/uL (ref 0.1–1.0)
Monocytes Relative: 8 %
Neutro Abs: 3 10*3/uL (ref 1.7–7.7)
Neutrophils Relative %: 72 %
Platelets: 183 10*3/uL (ref 150–400)
RBC: 4.97 MIL/uL (ref 4.22–5.81)
RDW: 14 % (ref 11.5–15.5)
WBC: 4.2 10*3/uL (ref 4.0–10.5)
nRBC: 0 % (ref 0.0–0.2)

## 2018-04-19 LAB — TSH: TSH: 38.519 u[IU]/mL — ABNORMAL HIGH (ref 0.350–4.500)

## 2018-04-19 MED ORDER — LEVOTHYROXINE SODIUM 100 MCG PO TABS: 100.0000 ug | ORAL_TABLET | Freq: Every day | ORAL | 1 refills | Status: DC

## 2018-04-22 ENCOUNTER — Encounter (HOSPITAL_COMMUNITY): Payer: Self-pay

## 2018-04-22 ENCOUNTER — Inpatient Hospital Stay (HOSPITAL_COMMUNITY): Payer: PPO

## 2018-04-22 VITALS — BP 133/62 | HR 60 | Temp 97.6°F | Resp 16 | Wt 175.2 lb

## 2018-04-22 DIAGNOSIS — C3490 Malignant neoplasm of unspecified part of unspecified bronchus or lung: Secondary | ICD-10-CM

## 2018-04-22 DIAGNOSIS — Z5112 Encounter for antineoplastic immunotherapy: Secondary | ICD-10-CM | POA: Diagnosis not present

## 2018-04-22 MED ORDER — HEPARIN SOD (PORK) LOCK FLUSH 100 UNIT/ML IV SOLN
500.0000 [IU] | Freq: Once | INTRAVENOUS | Status: AC | PRN
  Administered 2018-04-22: 500 [IU]
  Filled 2018-04-22: qty 5

## 2018-04-22 MED ORDER — SODIUM CHLORIDE 0.9 % IV SOLN
860.0000 mg | Freq: Once | INTRAVENOUS | Status: AC
  Administered 2018-04-22: 860 mg via INTRAVENOUS
  Filled 2018-04-22: qty 7.2

## 2018-04-22 MED ORDER — SODIUM CHLORIDE 0.9% FLUSH
10.0000 mL | INTRAVENOUS | Status: DC | PRN
  Administered 2018-04-22: 10 mL
  Filled 2018-04-22: qty 10

## 2018-04-22 MED ORDER — SODIUM CHLORIDE 0.9 % IV SOLN
Freq: Once | INTRAVENOUS | Status: AC
  Administered 2018-04-22: 14:00:00 via INTRAVENOUS

## 2018-04-22 NOTE — Progress Notes (Signed)
Treatment given per orders. Patient tolerated it well without problems. Vitals stable and discharged home from clinic ambulatory. Follow up as scheduled.  

## 2018-04-22 NOTE — Patient Instructions (Signed)
[facility] Discharge Instructions for Patients Receiving Chemotherapy   Beginning January 23rd 2017 lab work for the [facility] will be done in the  Main lab at [facility] on 1st floor. If you have a lab appointment with the [facility] please come in thru the  Main Entrance and check in at the main information desk   Today you received the following chemotherapy agents Imfinzi.Follow-up as scheduled. Call clinic for any questions or concerns  To help prevent nausea and vomiting after your treatment, we encourage you to take your nausea medication.   If you develop nausea and vomiting, or diarrhea that is not controlled by your medication, call the clinic.  The clinic phone number is [phone]. Office hours are Monday-Friday 8:30am-5:00pm.  BELOW ARE SYMPTOMS THAT SHOULD BE REPORTED IMMEDIATELY:  *FEVER GREATER THAN 101.0 F  *CHILLS WITH OR WITHOUT FEVER  NAUSEA AND VOMITING THAT IS NOT CONTROLLED WITH YOUR NAUSEA MEDICATION  *UNUSUAL SHORTNESS OF BREATH  *UNUSUAL BRUISING OR BLEEDING  TENDERNESS IN MOUTH AND THROAT WITH OR WITHOUT PRESENCE OF ULCERS  *URINARY PROBLEMS  *BOWEL PROBLEMS  UNUSUAL RASH Items with * indicate a potential emergency and should be followed up as soon as possible. If you have an emergency after office hours please contact your primary care physician or go to the nearest emergency department.  Please call the clinic during office hours if you have any questions or concerns.   You may also contact the Patient Navigator at [phone] should you have any questions or need assistance in obtaining follow up care.      Resources For Cancer Patients and their Caregivers ? American Cancer Society: Can assist with transportation, wigs, general needs, runs Look Good Feel Better.        (831)082-8459 ? Cancer Care: Provides financial assistance, online support groups, medication/co-pay assistance.  1-800-813-HOPE  346-224-5896) ? [place] Assists [place] cancer patients and their families through emotional , educational and financial support.  4313547655 ? [place] DSS Where to apply for food stamps, Medicaid and utility assistance. (208) 615-8210 ? [facility]: Transportation to medical appointments. 5170342887 ? Social Security Administration: May apply for disability if have a Stage IV cancer. 340-502-8310 519-719-4167 ? [employer], Disability and Transit Services: Assists with nutrition, care and transit needs. 252-186-0808

## 2018-04-26 ENCOUNTER — Telehealth: Payer: Self-pay | Admitting: Internal Medicine

## 2018-04-26 NOTE — Telephone Encounter (Signed)
[phone] patient came in and wanted another prescription to have his hep vaccines, his was written feb of 2019

## 2018-04-29 ENCOUNTER — Ambulatory Visit (HOSPITAL_COMMUNITY)
Admission: RE | Admit: 2018-04-29 | Discharge: 2018-04-29 | Disposition: A | Discharge status: Home / Self Care | Payer: PPO | Source: Ambulatory Visit | Attending: Nurse Practitioner | Admitting: Nurse Practitioner

## 2018-04-29 DIAGNOSIS — C3491 Malignant neoplasm of unspecified part of right bronchus or lung: Secondary | ICD-10-CM | POA: Insufficient documentation

## 2018-04-29 DIAGNOSIS — C3490 Malignant neoplasm of unspecified part of unspecified bronchus or lung: Secondary | ICD-10-CM | POA: Diagnosis not present

## 2018-04-29 MED ORDER — GADOBUTROL 1 MMOL/ML IV SOLN
7.0000 mL | Freq: Once | INTRAVENOUS | Status: AC | PRN
  Administered 2018-04-29: 7 mL via INTRAVENOUS

## 2018-04-29 NOTE — Telephone Encounter (Signed)
Spoke to pt's spouse. I mailed a new RX order for Hep A & B vaccination. [place] per spouse.

## 2018-05-03 ENCOUNTER — Other Ambulatory Visit: Payer: Self-pay

## 2018-05-03 ENCOUNTER — Inpatient Hospital Stay (HOSPITAL_COMMUNITY): Payer: PPO | Attending: Hematology

## 2018-05-03 DIAGNOSIS — G620 Drug-induced polyneuropathy: Secondary | ICD-10-CM | POA: Insufficient documentation

## 2018-05-03 DIAGNOSIS — Z5112 Encounter for antineoplastic immunotherapy: Secondary | ICD-10-CM | POA: Insufficient documentation

## 2018-05-03 DIAGNOSIS — E039 Hypothyroidism, unspecified: Secondary | ICD-10-CM | POA: Insufficient documentation

## 2018-05-03 DIAGNOSIS — R05 Cough: Secondary | ICD-10-CM | POA: Insufficient documentation

## 2018-05-03 DIAGNOSIS — F17210 Nicotine dependence, cigarettes, uncomplicated: Secondary | ICD-10-CM | POA: Diagnosis not present

## 2018-05-03 DIAGNOSIS — C3411 Malignant neoplasm of upper lobe, right bronchus or lung: Secondary | ICD-10-CM | POA: Diagnosis not present

## 2018-05-03 DIAGNOSIS — C3490 Malignant neoplasm of unspecified part of unspecified bronchus or lung: Secondary | ICD-10-CM

## 2018-05-03 DIAGNOSIS — R5383 Other fatigue: Secondary | ICD-10-CM | POA: Diagnosis not present

## 2018-05-03 LAB — CBC WITH DIFFERENTIAL/PLATELET
Abs Immature Granulocytes: 0.01 10*3/uL (ref 0.00–0.07)
Basophils Absolute: 0 10*3/uL (ref 0.0–0.1)
Basophils Relative: 0 %
Eosinophils Absolute: 0.2 10*3/uL (ref 0.0–0.5)
Eosinophils Relative: 4 %
HCT: 47.1 % (ref 39.0–52.0)
Hemoglobin: 14.6 g/dL (ref 13.0–17.0)
Immature Granulocytes: 0 %
Lymphocytes Relative: 16 %
Lymphs Abs: 0.7 10*3/uL (ref 0.7–4.0)
MCH: 29.6 pg (ref 26.0–34.0)
MCHC: 31 g/dL (ref 30.0–36.0)
MCV: 95.5 fL (ref 80.0–100.0)
Monocytes Absolute: 0.4 10*3/uL (ref 0.1–1.0)
Monocytes Relative: 8 %
Neutro Abs: 3.3 10*3/uL (ref 1.7–7.7)
Neutrophils Relative %: 72 %
Platelets: 167 10*3/uL (ref 150–400)
RBC: 4.93 MIL/uL (ref 4.22–5.81)
RDW: 14.3 % (ref 11.5–15.5)
WBC: 4.6 10*3/uL (ref 4.0–10.5)
nRBC: 0 % (ref 0.0–0.2)

## 2018-05-03 LAB — COMPREHENSIVE METABOLIC PANEL
ALT: 14 U/L (ref 0–44)
AST: 24 U/L (ref 15–41)
Albumin: 3.9 g/dL (ref 3.5–5.0)
Alkaline Phosphatase: 75 U/L (ref 38–126)
Anion gap: 7 (ref 5–15)
BUN: 18 mg/dL (ref 8–23)
CO2: 30 mmol/L (ref 22–32)
Calcium: 9 mg/dL (ref 8.9–10.3)
Chloride: 103 mmol/L (ref 98–111)
Creatinine, Ser: 0.95 mg/dL (ref 0.61–1.24)
GFR calc Af Amer: >60 mL/min (ref >60)
GFR calc non Af Amer: >60 mL/min (ref >60)
Glucose, Bld: 109 mg/dL — ABNORMAL HIGH (ref 70–99)
Potassium: 3.6 mmol/L (ref 3.5–5.1)
Sodium: 140 mmol/L (ref 135–145)
Total Bilirubin: 0.6 mg/dL (ref 0.3–1.2)
Total Protein: 7.5 g/dL (ref 6.5–8.1)

## 2018-05-06 ENCOUNTER — Ambulatory Visit (HOSPITAL_COMMUNITY)
Admission: RE | Admit: 2018-05-06 | Discharge: 2018-05-06 | Disposition: A | Discharge status: Home / Self Care | Payer: PPO | Source: Ambulatory Visit | Attending: Nurse Practitioner | Admitting: Nurse Practitioner

## 2018-05-06 ENCOUNTER — Inpatient Hospital Stay (HOSPITAL_COMMUNITY): Payer: PPO

## 2018-05-06 ENCOUNTER — Encounter (HOSPITAL_COMMUNITY): Payer: Self-pay | Admitting: Hematology

## 2018-05-06 ENCOUNTER — Ambulatory Visit (HOSPITAL_BASED_OUTPATIENT_CLINIC_OR_DEPARTMENT_OTHER): Admission: RE | Admit: 2018-05-06 | Payer: PPO | Source: Ambulatory Visit

## 2018-05-06 ENCOUNTER — Other Ambulatory Visit: Payer: Self-pay

## 2018-05-06 ENCOUNTER — Inpatient Hospital Stay (HOSPITAL_BASED_OUTPATIENT_CLINIC_OR_DEPARTMENT_OTHER): Payer: PPO | Admitting: Hematology

## 2018-05-06 VITALS — BP 138/76 | HR 50 | Temp 97.6°F | Resp 18 | Wt 177.1 lb

## 2018-05-06 DIAGNOSIS — G620 Drug-induced polyneuropathy: Secondary | ICD-10-CM

## 2018-05-06 DIAGNOSIS — R059 Cough, unspecified: Secondary | ICD-10-CM

## 2018-05-06 DIAGNOSIS — Z5112 Encounter for antineoplastic immunotherapy: Secondary | ICD-10-CM | POA: Diagnosis not present

## 2018-05-06 DIAGNOSIS — C3411 Malignant neoplasm of upper lobe, right bronchus or lung: Secondary | ICD-10-CM

## 2018-05-06 DIAGNOSIS — R5383 Other fatigue: Secondary | ICD-10-CM | POA: Diagnosis not present

## 2018-05-06 DIAGNOSIS — R05 Cough: Secondary | ICD-10-CM | POA: Diagnosis not present

## 2018-05-06 DIAGNOSIS — F17210 Nicotine dependence, cigarettes, uncomplicated: Secondary | ICD-10-CM | POA: Diagnosis not present

## 2018-05-06 DIAGNOSIS — E039 Hypothyroidism, unspecified: Secondary | ICD-10-CM | POA: Diagnosis not present

## 2018-05-06 NOTE — Progress Notes (Signed)
No treatment today per Dr.Katragadda. Pt has appt in [place].

## 2018-05-06 NOTE — Progress Notes (Signed)
[place],  [address]   CLINIC:  Medical Oncology/Hematology  PCP:  Sharilyn Sites, MD [address] 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  CURRENT THERAPY:Imfinizievery 2 weeks  BRIEF ONCOLOGIC HISTORY:    Carcinoma, lung ([place])   06/05/2017 Initial Diagnosis    Carcinoma, lung ([place])    06/05/2017 - 07/18/2017 Chemotherapy    The patient had palonosetron (ALOXI) injection 0.25 mg, 0.25 mg, Intravenous,  Once, 6 of 6 cycles Administration: 0.25 mg (06/11/2017), 0.25 mg (07/09/2017), 0.25 mg (07/16/2017), 0.25 mg (06/18/2017), 0.25 mg (06/25/2017), 0.25 mg (07/02/2017) CARBOplatin (PARAPLATIN) 190 mg in sodium chloride 0.9 % 250 mL chemo infusion, 190 mg (100 % of original dose 192.8 mg), Intravenous,  Once, 6 of 6 cycles Dose modification:   (original dose 192.8 mg, Cycle 1),   (original dose 192.8 mg, Cycle 5), 144.6 mg (original dose 144.6 mg, Cycle 6),   (original dose 192.8 mg, Cycle 2),   (original dose 192.8 mg, Cycle 3),   (original dose 192.8 mg, Cycle 4) Administration: 190 mg (06/11/2017), 190 mg (07/09/2017), 140 mg (07/16/2017), 190 mg (06/18/2017), 190 mg (06/25/2017), 190 mg (07/02/2017) PACLitaxel (TAXOL) 90 mg in dextrose 5 % 250 mL chemo infusion (</= 105m/m2), 45 mg/m2 = 90 mg, Intravenous,  Once, 6 of 6 cycles Dose modification: 40.5 mg/m2 (90 % of original dose 45 mg/m2, Cycle 5, Reason: Other (see comments), Comment: early neuropathy), 36 mg/m2 (80 % of original dose 45 mg/m2, Cycle 6, Reason: Provider Judgment) Administration: 90 mg (06/11/2017), 78 mg (07/09/2017), 72 mg (07/16/2017), 90 mg (06/18/2017), 78 mg (06/25/2017), 78 mg (07/02/2017)  for chemotherapy treatment.      [place] lung [place]   06/08/2017 Initial Diagnosis    Malignant ne[place] of [place]us and lung ([facility]    09/20/2017 -  Chemotherapy    The patient had durvalumab  (IMFINZI) 860 mg in sodium chloride 0.9 % 100 mL chemo infusion, 800 mg, Intravenous,  Once, 16 of 24 cycles Administration: 860 mg (09/20/2017), 860 mg (10/04/2017), 860 mg (10/18/2017), 860 mg (11/01/2017), 860 mg (11/15/2017), 860 mg (11/29/2017), 860 mg (12/13/2017), 860 mg (12/27/2017), 860 mg (01/10/2018), 860 mg (01/28/2018), 860 mg (02/11/2018), 860 mg (02/25/2018), 860 mg (03/11/2018), 860 mg (03/25/2018), 860 mg (04/08/2018), 860 mg (04/22/2018)  for chemotherapy treatment.         INTERVAL HISTORY:  Mr. [id number]. male returns for routine follow-up for non small cell lung cancer. He is here today with his wife. He reports he is has a new cough over the last two weeks. It is a dry cough non productive. It reports it is mostly at night. He is also more fatigued since starting treatment back. He also has occasional dizziness. Denies any nausea, vomiting, or diarrhea. Denies any new pains. Had not noticed any recent bleeding such as epistaxis, hematuria or hematochezia. Denies any itching or skin rashes. Denies recent chest pain on exertion, shortness of breath on minimal exertion, pre-syncopal episodes, or palpitations. Denies any numbness or tingling in hands or feet. Denies any recent fevers, infections, or recent hospitalizations. Patient reports appetite at 100% and energy level at 50%. He is maintaining his weight.     REVIEW OF SYSTEMS:  Review of Systems  Constitutional: Positive for fatigue.  Respiratory: Positive for cough and shortness of breath.   Neurological: Positive for dizziness.  Hematological: Bruises/bleeds easily.  Psychiatric/Behavioral: Positive for sleep disturbance.  All other systems reviewed and are negative.    PAST MEDICAL/SURGICAL HISTORY:  Past Medical History:  Diagnosis Date  . Anxiety   . Arthritis   . Cirrhosis ([place])    confirmed by MRI on 07/04/11.    Marland Kitchen COPD (chronic obstructive pulmonary disease) ([place])   . Depression with anxiety   . ETOH abuse   .  Hyperlipidemia   . Nodule of upper lobe of right lung    with mediastinal adenopathy  . Wears dentures   . Wears glasses    Past Surgical History:  Procedure Laterality Date  . bilateral cataract surgery     [place]  . broken arm  6 yrs ago   left otif of wrist-[facility]  . CLOSED REDUCTION WRIST FRACTURE Right 09/02/2015   Procedure: RIGHT WRIST REDUCTION;  Surgeon: Renette Butters, MD;  Location: [facility];  Service: Orthopedics;  Laterality: Right;  with MAC  . COLONOSCOPY  1996   [facility]: external hemorrhoids, no polyps  . COLONOSCOPY  06/28/2011   Dr. Ala Bent diverticulosis,tubular adenoma, hyperplastic polyp  . COLONOSCOPY WITH PROPOFOL N/A 04/26/2017   Dr. Gala Romney: perianal and digital rectal examinations normal, diffusely congested colonic mucosa but otherwise normal  . ESOPHAGOGASTRODUODENOSCOPY  06/28/2011   Dr. Chelsea Aus erosive reflux esophagitis, hiatal hernia-gastritis  . ESOPHAGOGASTRODUODENOSCOPY (EGD) WITH PROPOFOL N/A 04/26/2017   Dr. Gala Romney: normal esophagus, small hiatal hernia, GAVE, portal hypertensive gastropathy, normal duodenal bulb and second portion, no specimens collected. 2 years screening   . EXTERNAL EAR SURGERY     right ear-cleaned out ear and created new eardrum  . INGUINAL HERNIA REPAIR  2-3 yrs ago   left-[facility]  . INTRAMEDULLARY (IM) NAIL INTERTROCHANTERIC Right 09/02/2015   Procedure: RIGHT  INTERTROCHANTRIC HIP;  Surgeon: Renette Butters, MD;  Location: [facility];  Service: Orthopedics;  Laterality: Right;  With MAC  . KNEE SURGERY     left-arthroscopy-[facility]  . PORTACATH PLACEMENT Left 06/08/2017   Procedure: INSERTION POWER PORT WITH  ATTACHED CATHETER LEFT SUBCLAVIAN;  Surgeon: Aviva Signs, MD;  Location: [facility];  Service: General;  Laterality: Left;  . SKULL FRACTURE ELEVATION     fractured cheeck bone repaired  . TOTAL HIP ARTHROPLASTY  12/18/2011   Procedure: TOTAL HIP ARTHROPLASTY;  Surgeon: Carole Civil, MD;  Location: [facility];   Service: Orthopedics;  Laterality: Left;  Marland Kitchen VIDEO BRONCHOSCOPY WITH ENDOBRONCHIAL ULTRASOUND N/A 05/04/2017   Procedure: VIDEO BRONCHOSCOPY WITH ENDOBRONCHIAL ULTRASOUND;  Surgeon: Melrose Nakayama, MD;  Location: [facility];  Service: Thoracic;  Laterality: N/A;  . WISDOM TOOTH EXTRACTION       SOCIAL HISTORY:  Social History   Socioeconomic History  . Marital status: Married    Spouse name: Not on file  . Number of children: Not on file  . Years of education: Not on file  . Highest education level: Not on file  Occupational History  . Occupation: retired    Fish farm manager: [employer]  Social Needs  . Financial resource strain: Not on file  . Food insecurity:    Worry: Not on file    Inability: Not on file  . Transportation needs:    Medical: Not on file    Non-medical: Not on file  Tobacco Use  . Smoking status: Current Every Day Smoker    Packs/day: 0.50    Years: 62.00    Pack years: 31.00    Types: Cigarettes  . Smokeless tobacco: Never Used  .  Tobacco comment: 1/2 ppd > 60 years as of 06/27/17: 7 cigarettes or less a day  Substance and Sexual Activity  . Alcohol use: Not Currently    Comment: history of ETOH abuse, stopped 1.5 years ago (had one drink [place] [employer]).   . Drug use: No  . Sexual activity: Yes    Birth control/protection: None  Lifestyle  . Physical activity:    Days per week: Not on file    Minutes per session: Not on file  . Stress: Not on file  Relationships  . Social connections:    Talks on phone: Not on file    Gets together: Not on file    Attends religious service: Not on file    Active member of club or organization: Not on file    Attends meetings of clubs or organizations: Not on file    Relationship status: Not on file  . Intimate partner violence:    Fear of current or ex partner: Not on file    Emotionally abused: Not on file    Physically abused: Not on file    Forced sexual activity: Not on file  Other Topics  Concern  . Not on file  Social History Narrative  . Not on file    FAMILY HISTORY:  Family History  Problem Relation Age of Onset  . Stroke Mother 46  . Heart disease Father 49       MI  . Diabetes Maternal Uncle   . Colon cancer Neg Hx   . Cancer Neg Hx     CURRENT MEDICATIONS:  Outpatient Encounter Medications as of 05/06/2018  Medication Sig  . cholecalciferol (VITAMIN D) 1000 units tablet Take 1,000 Units by mouth daily.  [place] Oris (IMFINZI IV) Inject into the vein.  . fluticasone (FLONASE) 50 MCG/ACT nasal spray PLACE 2 SPRAYS IEN QD  . levothyroxine (SYNTHROID, LEVOTHROID) 100 MCG tablet Take 1 tablet (100 mcg total) by mouth daily before breakfast.  . meclizine (ANTIVERT) 25 MG tablet TK 1 T PO  Q 6 H PRN  . Nutritional Supplements (ENSURE ENLIVE PO) Take 237 mLs by mouth daily.   Marland Kitchen PROAIR HFA 108 (90 Base) MCG/ACT inhaler Inhale 2 puffs into the lungs every 6 (six) hours as needed (for shortness of breath/wheezing/cough).   . Propylene Glycol-Glycerin (SOOTHE) 0.6-0.6 % SOLN Place 1-2 drops into both eyes 3 (three) times daily as needed (for dry/irritated eyes.).  Marland Kitchen Skin Protectants, Misc. (EUCERIN) cream Apply 1 application topically 4 (four) times daily as needed for dry skin (for itchy or dry skin).  . SPIRIVA HANDIHALER 18 MCG inhalation capsule INHALE CONTENTS OF 1 CAPSULE VIA HANDHALER QD   No facility-administered encounter medications on file as of 05/06/2018.     ALLERGIES:  No Known Allergies   PHYSICAL EXAM:  ECOG Performance status: 1  Vitals:   05/06/18 0952  BP: 138/76  Pulse: (!) 50  Resp: 18  Temp: 97.6 F (36.4 C)  SpO2: 95%   Filed Weights   05/06/18 0952  Weight: 177 lb 1.6 oz (80.3 kg)    Physical Exam Constitutional:      Appearance: Normal appearance. He is normal weight.  Cardiovascular:     Rate and Rhythm: Normal rate and regular rhythm.     Heart sounds: Normal heart sounds.  Pulmonary:     Effort: Pulmonary effort is  normal.     Breath sounds: Normal breath sounds.  Musculoskeletal: Normal range of motion.  Skin:  General: Skin is warm and dry.  Neurological:     Mental Status: He is alert and oriented to person, place, and time. Mental status is at baseline.  Psychiatric:        Mood and Affect: Mood normal.        Behavior: Behavior normal.        Thought Content: Thought content normal.        Judgment: Judgment normal.      LABORATORY DATA:  I have reviewed the labs as listed.  CBC    Component Value Date/Time   WBC 4.6 05/03/2018 1205   RBC 4.93 05/03/2018 1205   HGB 14.6 05/03/2018 1205   HCT 47.1 05/03/2018 1205   PLT 167 05/03/2018 1205   MCV 95.5 05/03/2018 1205   MCH 29.6 05/03/2018 1205   MCHC 31.0 05/03/2018 1205   RDW 14.3 05/03/2018 1205   LYMPHSABS 0.7 05/03/2018 1205   MONOABS 0.4 05/03/2018 1205   EOSABS 0.2 05/03/2018 1205   BASOSABS 0.0 05/03/2018 1205   CMP Latest Ref Rng & Units 05/03/2018 04/19/2018 04/05/2018  Glucose 70 - 99 mg/dL 109(H) 97 92  BUN 8 - 23 mg/dL _0 Creatinine 0.61 - 1.24 mg/dL 0.95 1.05 0.98  Sodium 135 - 145 mmol/L 140 138 137  Potassium 3.5 - 5.1 mmol/L 3.6 4.0 4.1  Chloride 98 - 111 mmol/L 103 100 98  CO2 22 - 32 mmol/L _1 Calcium 8.9 - 10.3 mg/dL 9.0 9.1 8.8(L)  Total Protein 6.5 - 8.1 g/dL 7.5 7.2 7.3  Total Bilirubin 0.3 - 1.2 mg/dL 0.6 0.5 0.8  Alkaline Phos 38 - 126 U/L 75 75 71  AST 15 - 41 U/L _2 ALT 0 - 44 U/L _3 DIAGNOSTIC IMAGING:  I have independently reviewed the scans and discussed with the patient.   I have reviewed Francene Finders, NP's note and agree with the documentation.  I personally performed a face-to-face visit, made revisions and my assessment and plan is as follows.    ASSESSMENT & PLAN:   Carcinoma, lung ([place]) 1.  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 -Chemoradiation therapy with carboplatin and paclitaxel weekly from 06/11/2017 through 07/16/2017. -CT scan of the chest dated 09/07/2017 showed 10 x 7 mm right upper lobe nodule which has not substantially changed in the interval (previously measured 10 x 8 mm).  Right paratracheal adenopathy is no longer visible.  He got partial response and was recommended immunotherapy with Durvalumab every 2 weeks for 26 treatments based on excellent results from PACIFIC trial. -Durvalumab consolidation started on 09/20/2017.  - CT CAP on 03/22/2018 shows stable appearance of spiculated right apical nodule, some new hazy interstitial accentuation in the right upper lobe in the vicinity of this nodule could be from interval radiation therapy.  3 mm nodule in the right lower lobe is new could be inflammatory.  Hepatic cirrhosis and ascites.  Old wedge compression fracture of L1.  - MRI of the brain on 04/29/2018 done for dizziness did not show any metastatic disease. -He reports dry cough for the last 2 weeks.  Lungs are clear to auscultation.  I have done a chest x-ray in my office and compared  it with x-ray from January.  No major changes seen.  No pneumonitis picture. -He will come back Wednesday for his next infusion. -I will see him back in 4 weeks for follow-up.  2.  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  3.  Hypothyroidism: -He was started on 125 mcg Synthroid which was subsequently decreased to 50 mcg. -Last TSH was 38.5 on 04/19/2018.  We increased the Synthroid 200 mcg.  We will check his TSH at next visit.        Orders placed this encounter:  Orders Placed This Encounter  Procedures  . DG Chest 2 View      Derek Jack, [place] [phone]

## 2018-05-06 NOTE — Assessment & Plan Note (Signed)
1.  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 -Chemoradiation therapy with carboplatin and paclitaxel weekly from 06/11/2017 through 07/16/2017. -CT scan of the chest dated 09/07/2017 showed 10 x 7 mm right upper lobe nodule which has not substantially changed in the interval (previously measured 10 x 8 mm).  Right paratracheal adenopathy is no longer visible.  He got partial response and was recommended immunotherapy with Durvalumab every 2 weeks for 26 treatments based on excellent results from [facility]IFIC trial. -Durvalumab consolidation started on 09/20/2017.  - CT CAP on 03/22/2018 shows stable appearance of spiculated right apical nodule, some new hazy interstitial accentuation in the right upper lobe in the vicinity of this nodule could be from interval radiation therapy.  3 mm nodule in the right lower lobe is new could be inflammatory.  Hepatic cirrhosis and ascites.  Old wedge compression fracture of L1.  - MRI of the brain on 04/29/2018 done for dizziness did not show any metastatic disease. -He reports dry cough for the last 2 weeks.  Lungs are clear to auscultation.  I have done a chest x-ray in my office and compared it with x-ray from January.  No major changes seen.  No pneumonitis picture. -He will come back Wednesday for his next infusion. -I will see him back in 4 weeks for follow-up.  2.  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  3.  Hypothyroidism: -He was started on 125 mcg Synthroid which was subsequently decreased to 50 mcg. -Last TSH was 38.5 on 04/19/2018.  We increased the Synthroid 200 mcg.  We will check his TSH at next visit.

## 2018-05-06 NOTE — Patient Instructions (Signed)
[place] at [facility]  Discharge Instructions:  You saw Dr. Delton Coombes today. _______________________________________________________________  Thank you for choosing [place] at [facility] to provide your oncology and hematology care.  To afford each patient quality time with our providers, please arrive at least 15 minutes before your scheduled appointment.  You need to re-schedule your appointment if you arrive 10 or more minutes late.  We strive to give you quality time with our providers, and arriving late affects you and other patients whose appointments are after yours.  Also, if you no show three or more times for appointments you may be dismissed from the clinic.  Again, thank you for choosing [facility] at [facility] hope is that these requests will allow you access to exceptional care and in a timely manner. _______________________________________________________________  If you have questions after your visit, please contact our office at [phone] between the hours of 8:30 a.m. and 5:00 p.m. Voicemails left after 4:30 p.m. will not be returned until the following business day. _______________________________________________________________  For prescription refill requests, have your pharmacy contact our office. _______________________________________________________________  Recommendations made by the consultant and any test results will be sent to your referring physician. _______________________________________________________________

## 2018-05-08 ENCOUNTER — Encounter (HOSPITAL_COMMUNITY): Payer: Self-pay

## 2018-05-08 ENCOUNTER — Other Ambulatory Visit: Payer: Self-pay

## 2018-05-08 ENCOUNTER — Inpatient Hospital Stay (HOSPITAL_COMMUNITY): Payer: PPO

## 2018-05-08 VITALS — BP 144/61 | HR 55 | Temp 97.7°F | Resp 18 | Wt 176.0 lb

## 2018-05-08 DIAGNOSIS — Z5112 Encounter for antineoplastic immunotherapy: Secondary | ICD-10-CM | POA: Diagnosis not present

## 2018-05-08 DIAGNOSIS — C3490 Malignant neoplasm of unspecified part of unspecified bronchus or lung: Secondary | ICD-10-CM

## 2018-05-08 MED ORDER — SODIUM CHLORIDE 0.9 % IV SOLN
Freq: Once | INTRAVENOUS | Status: AC
  Administered 2018-05-08: 10:00:00 via INTRAVENOUS

## 2018-05-08 MED ORDER — SODIUM CHLORIDE 0.9 % IV SOLN
860.0000 mg | Freq: Once | INTRAVENOUS | Status: AC
  Administered 2018-05-08: 860 mg via INTRAVENOUS
  Filled 2018-05-08: qty 10

## 2018-05-08 MED ORDER — SODIUM CHLORIDE 0.9% FLUSH
10.0000 mL | INTRAVENOUS | Status: DC | PRN
  Administered 2018-05-08: 10 mL
  Filled 2018-05-08: qty 10

## 2018-05-08 MED ORDER — HEPARIN SOD (PORK) LOCK FLUSH 100 UNIT/ML IV SOLN
500.0000 [IU] | Freq: Once | INTRAVENOUS | Status: AC | PRN
  Administered 2018-05-08: 500 [IU]

## 2018-05-08 NOTE — Patient Instructions (Signed)
[facility] Discharge Instructions for Patients Receiving Chemotherapy  Today you received the following chemotherapy agents   To help prevent nausea and vomiting after your treatment, we encourage you to take your nausea medication   If you develop nausea and vomiting that is not controlled by your nausea medication, call the clinic.   BELOW ARE SYMPTOMS THAT SHOULD BE REPORTED IMMEDIATELY:  *FEVER GREATER THAN 100.5 F  *CHILLS WITH OR WITHOUT FEVER  NAUSEA AND VOMITING THAT IS NOT CONTROLLED WITH YOUR NAUSEA MEDICATION  *UNUSUAL SHORTNESS OF BREATH  *UNUSUAL BRUISING OR BLEEDING  TENDERNESS IN MOUTH AND THROAT WITH OR WITHOUT PRESENCE OF ULCERS  *URINARY PROBLEMS  *BOWEL PROBLEMS  UNUSUAL RASH Items with * indicate a potential emergency and should be followed up as soon as possible.  Feel free to call the clinic should you have any questions or concerns. The clinic phone number is [phone].  Please show the CHEMO ALERT CARD at check-in to the Emergency Department and triage nurse.   

## 2018-05-08 NOTE — Progress Notes (Signed)
Pt presents today for Imfinzi. Labs drawn on 05/03/2018. Labs within parameters for treatment. VS within parameters for treatment. No complaints of any changes since the last visit.   Pt states Hep A and B vaccine obtained from [facility] last Friday.   Treatment given today per MD orders. Tolerated infusion without adverse affects. Vital signs stable. No complaints at this time. Discharged from clinic ambulatory. F/U with [facility] as scheduled.

## 2018-05-23 ENCOUNTER — Other Ambulatory Visit (HOSPITAL_COMMUNITY): Payer: Self-pay

## 2018-05-23 DIAGNOSIS — C3491 Malignant neoplasm of unspecified part of right bronchus or lung: Secondary | ICD-10-CM

## 2018-05-24 ENCOUNTER — Other Ambulatory Visit: Payer: Self-pay

## 2018-05-24 ENCOUNTER — Inpatient Hospital Stay (HOSPITAL_COMMUNITY): Payer: PPO

## 2018-05-24 DIAGNOSIS — Z5112 Encounter for antineoplastic immunotherapy: Secondary | ICD-10-CM | POA: Diagnosis not present

## 2018-05-24 DIAGNOSIS — C3491 Malignant neoplasm of unspecified part of right bronchus or lung: Secondary | ICD-10-CM

## 2018-05-24 DIAGNOSIS — C3490 Malignant neoplasm of unspecified part of unspecified bronchus or lung: Secondary | ICD-10-CM

## 2018-05-24 LAB — CBC WITH DIFFERENTIAL/PLATELET
Abs Immature Granulocytes: 0.01 10*3/uL (ref 0.00–0.07)
Basophils Absolute: 0 10*3/uL (ref 0.0–0.1)
Basophils Relative: 0 %
Eosinophils Absolute: 0.1 10*3/uL (ref 0.0–0.5)
Eosinophils Relative: 3 %
HCT: 47.9 % (ref 39.0–52.0)
Hemoglobin: 15 g/dL (ref 13.0–17.0)
Immature Granulocytes: 0 %
Lymphocytes Relative: 15 %
Lymphs Abs: 0.7 10*3/uL (ref 0.7–4.0)
MCH: 30 pg (ref 26.0–34.0)
MCHC: 31.3 g/dL (ref 30.0–36.0)
MCV: 95.8 fL (ref 80.0–100.0)
Monocytes Absolute: 0.3 10*3/uL (ref 0.1–1.0)
Monocytes Relative: 8 %
Neutro Abs: 3.3 10*3/uL (ref 1.7–7.7)
Neutrophils Relative %: 74 %
Platelets: 165 10*3/uL (ref 150–400)
RBC: 5 MIL/uL (ref 4.22–5.81)
RDW: 14.6 % (ref 11.5–15.5)
WBC: 4.5 10*3/uL (ref 4.0–10.5)
nRBC: 0 % (ref 0.0–0.2)

## 2018-05-24 LAB — COMPREHENSIVE METABOLIC PANEL
ALT: 15 U/L (ref 0–44)
AST: 23 U/L (ref 15–41)
Albumin: 4.1 g/dL (ref 3.5–5.0)
Alkaline Phosphatase: 83 U/L (ref 38–126)
Anion gap: 8 (ref 5–15)
BUN: 18 mg/dL (ref 8–23)
CO2: 29 mmol/L (ref 22–32)
Calcium: 9.2 mg/dL (ref 8.9–10.3)
Chloride: 101 mmol/L (ref 98–111)
Creatinine, Ser: 0.93 mg/dL (ref 0.61–1.24)
GFR calc Af Amer: >60 mL/min (ref >60)
GFR calc non Af Amer: >60 mL/min (ref >60)
Glucose, Bld: 111 mg/dL — ABNORMAL HIGH (ref 70–99)
Potassium: 3.6 mmol/L (ref 3.5–5.1)
Sodium: 138 mmol/L (ref 135–145)
Total Bilirubin: 0.6 mg/dL (ref 0.3–1.2)
Total Protein: 7.6 g/dL (ref 6.5–8.1)

## 2018-05-24 LAB — TSH: TSH: 1.352 u[IU]/mL (ref 0.350–4.500)

## 2018-05-27 ENCOUNTER — Encounter (HOSPITAL_COMMUNITY): Payer: Self-pay

## 2018-05-27 ENCOUNTER — Other Ambulatory Visit: Payer: Self-pay

## 2018-05-27 ENCOUNTER — Inpatient Hospital Stay (HOSPITAL_COMMUNITY): Payer: PPO

## 2018-05-27 VITALS — BP 157/58 | HR 56 | Temp 97.6°F | Resp 18 | Wt 176.8 lb

## 2018-05-27 DIAGNOSIS — C3490 Malignant neoplasm of unspecified part of unspecified bronchus or lung: Secondary | ICD-10-CM

## 2018-05-27 DIAGNOSIS — Z5112 Encounter for antineoplastic immunotherapy: Secondary | ICD-10-CM | POA: Diagnosis not present

## 2018-05-27 MED ORDER — SODIUM CHLORIDE 0.9% FLUSH
10.0000 mL | INTRAVENOUS | Status: DC | PRN
  Administered 2018-05-27: 10 mL
  Filled 2018-05-27: qty 10

## 2018-05-27 MED ORDER — SODIUM CHLORIDE 0.9 % IV SOLN
10.6000 mg/kg | Freq: Once | INTRAVENOUS | Status: AC
  Administered 2018-05-27: 860 mg via INTRAVENOUS
  Filled 2018-05-27: qty 10

## 2018-05-27 MED ORDER — SODIUM CHLORIDE 0.9 % IV SOLN
Freq: Once | INTRAVENOUS | Status: AC
  Administered 2018-05-27: 11:00:00 via INTRAVENOUS

## 2018-05-27 MED ORDER — HEPARIN SOD (PORK) LOCK FLUSH 100 UNIT/ML IV SOLN
500.0000 [IU] | Freq: Once | INTRAVENOUS | Status: AC | PRN
  Administered 2018-05-27: 500 [IU]

## 2018-05-27 NOTE — Patient Instructions (Signed)
[facility] Discharge Instructions for Patients Receiving Chemotherapy  Today you received the following chemotherapy agents   To help prevent nausea and vomiting after your treatment, we encourage you to take your nausea medication   If you develop nausea and vomiting that is not controlled by your nausea medication, call the clinic.   BELOW ARE SYMPTOMS THAT SHOULD BE REPORTED IMMEDIATELY:  *FEVER GREATER THAN 100.5 F  *CHILLS WITH OR WITHOUT FEVER  NAUSEA AND VOMITING THAT IS NOT CONTROLLED WITH YOUR NAUSEA MEDICATION  *UNUSUAL SHORTNESS OF BREATH  *UNUSUAL BRUISING OR BLEEDING  TENDERNESS IN MOUTH AND THROAT WITH OR WITHOUT PRESENCE OF ULCERS  *URINARY PROBLEMS  *BOWEL PROBLEMS  UNUSUAL RASH Items with * indicate a potential emergency and should be followed up as soon as possible.  Feel free to call the clinic should you have any questions or concerns. The clinic phone number is [phone].  Please show the CHEMO ALERT CARD at check-in to the Emergency Department and triage nurse.   

## 2018-05-27 NOTE — Progress Notes (Signed)
Treatment given today per MD orders. Tolerated infusion without adverse affects. Vital signs stable. No complaints at this time. Discharged from clinic ambulatory. F/U with [facility] as scheduled.   

## 2018-05-30 ENCOUNTER — Ambulatory Visit (INDEPENDENT_AMBULATORY_CARE_PROVIDER_SITE_OTHER): Payer: PPO | Admitting: Gastroenterology

## 2018-05-30 ENCOUNTER — Telehealth: Payer: Self-pay | Admitting: Gastroenterology

## 2018-05-30 ENCOUNTER — Other Ambulatory Visit: Payer: Self-pay

## 2018-05-30 DIAGNOSIS — F1021 Alcohol dependence, in remission: Secondary | ICD-10-CM

## 2018-05-30 DIAGNOSIS — K7030 Alcoholic cirrhosis of liver without ascites: Secondary | ICD-10-CM

## 2018-05-30 NOTE — Telephone Encounter (Signed)
Routing to [place] as she takes care of recalls.

## 2018-05-30 NOTE — Progress Notes (Signed)
Primary Care Physician:  Sharilyn Sites, MD  Primary GI: Dr. Gala Romney   Virtual Visit via Telephone Note Due to COVID-19, visit is conducted virtually and was requested by patient.   I connected with Kathaleen Bury on 05/30/18 at 10:54 AM EDT by telephone and verified that I am speaking with the correct person using two identifiers.   I discussed the limitations, risks, security and privacy concerns of performing an evaluation and management service by telephone and the availability of in person appointments. I also discussed with the patient that there may be a patient responsible charge related to this service. The patient expressed understanding and agreed to proceed.  Chief Complaint  Patient presents with  . 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 Next EGD due in 2021. Imaging on file from Oct 2019 with cirrhosis but no HCC, mild portal hypertension suggested with prominent spleen. CT abd/pelvis with contrast Jan 2020 with cirrhosis, no HCC.    Sober for 4 years. Started Hep A/B vaccinations in March. Went to [employer] [facility] in [place]. Only issue is chronic shortness of breath, which is unchanged. No abdominal pain, N/V. No overt GI bleeding. Good appetite. No mental status changes, confusion. No abdominal swelling or edema. Staying home and working around the house.   Past Medical History:  Diagnosis Date  . Anxiety   . Arthritis   . Cirrhosis ([place])    confirmed by MRI on 07/04/11.    Marland Kitchen COPD (chronic obstructive pulmonary disease) ([place])   . Depression with anxiety   . ETOH abuse   . Hyperlipidemia   . Nodule of upper lobe of right lung    with mediastinal adenopathy  . Wears dentures   . Wears glasses      Past Surgical History:  Procedure Laterality Date  .  bilateral cataract surgery     [place]  . broken arm  6 yrs ago   left otif of wrist-[place]  . CLOSED REDUCTION WRIST FRACTURE Right 09/02/2015   Procedure: RIGHT WRIST REDUCTION;  Surgeon: Renette Butters, MD;  Location: [facility];  Service: Orthopedics;  Laterality: Right;  with MAC  . COLONOSCOPY  1996   [place]: external hemorrhoids, no polyps  . COLONOSCOPY  06/28/2011   Dr. Ala Bent diverticulosis,tubular adenoma, hyperplastic polyp  . COLONOSCOPY WITH PROPOFOL N/A 04/26/2017   Dr. Gala Romney: perianal and digital rectal examinations normal, diffusely congested colonic mucosa but otherwise normal  . ESOPHAGOGASTRODUODENOSCOPY  06/28/2011   Dr. Chelsea Aus erosive reflux esophagitis, hiatal hernia-gastritis  . ESOPHAGOGASTRODUODENOSCOPY (EGD) WITH PROPOFOL N/A 04/26/2017   Dr. Gala Romney: normal esophagus, small hiatal hernia, GAVE, portal hypertensive gastropathy, normal duodenal bulb and second portion, no specimens collected. 2 years screening   . EXTERNAL EAR SURGERY     right ear-cleaned out ear and created new eardrum  . INGUINAL HERNIA REPAIR  2-3 yrs ago   left-[place]  . INTRAMEDULLARY (IM) NAIL INTERTROCHANTERIC Right 09/02/2015   Procedure: RIGHT  INTERTROCHANTRIC HIP;  Surgeon: Renette Butters, MD;  Location: [facility];  Service: Orthopedics;  Laterality: Right;  With MAC  . KNEE SURGERY     left-arthroscopy-[place]  . PORTACATH PLACEMENT Left 06/08/2017   Procedure: INSERTION POWER PORT WITH  ATTACHED CATHETER LEFT SUBCLAVIAN;  Surgeon: Aviva Signs, MD;  [facility];  Service: General;  Laterality: Left;  . SKULL FRACTURE ELEVATION     fractured cheeck bone repaired  . TOTAL HIP ARTHROPLASTY  12/18/2011   Procedure: TOTAL HIP ARTHROPLASTY;  Surgeon: Carole Civil, MD;  Location: [facility];  Service: Orthopedics;  Laterality: Left;  Marland Kitchen VIDEO BRONCHOSCOPY WITH ENDOBRONCHIAL ULTRASOUND N/A 05/04/2017   Procedure: VIDEO BRONCHOSCOPY WITH ENDOBRONCHIAL ULTRASOUND;  Surgeon:  Melrose Nakayama, MD;  [facility];  Service: Thoracic;  Laterality: N/A;  . WISDOM TOOTH EXTRACTION       Current Meds  Medication Sig  . cholecalciferol (VITAMIN D) 1000 units tablet Take 1,000 Units by mouth daily.  Hunt Oris (IMFINZI IV) Inject into the vein.  . fluticasone (FLONASE) 50 MCG/ACT nasal spray PLACE 2 SPRAYS IEN QD  . levothyroxine (SYNTHROID, LEVOTHROID) 100 MCG tablet Take 1 tablet (100 mcg total) by mouth daily before breakfast.  . meclizine (ANTIVERT) 25 MG tablet TK 1 T PO  Q 6 H PRN  . Nutritional Supplements (ENSURE ENLIVE PO) Take 237 mLs by mouth daily.   Marland Kitchen PROAIR HFA 108 (90 Base) MCG/ACT inhaler Inhale 2 puffs into the lungs every 6 (six) hours as needed (for shortness of breath/wheezing/cough).   . Propylene Glycol-Glycerin (SOOTHE) 0.6-0.6 % SOLN Place 1-2 drops into both eyes 3 (three) times daily as needed (for dry/irritated eyes.).  Marland Kitchen SPIRIVA HANDIHALER 18 MCG inhalation capsule INHALE CONTENTS OF 1 CAPSULE VIA HANDHALER QD       Observations/Objective: No distress. Unable to perform physical exam due to telephone encounter. No video available.   Lab Results  Component Value Date   ALT 15 05/24/2018   AST 23 05/24/2018   ALKPHOS 83 05/24/2018   BILITOT 0.6 05/24/2018   Lab Results  Component Value Date   WBC 4.5 05/24/2018   HGB 15.0 05/24/2018   HCT 47.9 05/24/2018   MCV 95.8 05/24/2018   PLT 165 05/24/2018   Lab Results  Component Value Date   CREATININE 0.93 05/24/2018   BUN 18 05/24/2018   NA 138 05/24/2018   K 3.6 05/24/2018   CL 101 05/24/2018   CO2 29 05/24/2018     Assessment and Plan: Pleasant 78 year old male with history of cirrhosis likely due to ETOH, remaining sober for past 4 years. Well-compensated at this time. Due for EGD in 2021. Recent imaging on file from Jan 2020 and will be due again in July. Will see him in 6 months.   Follow Up Instructions: RUQ ultrasound in July 2020 Return in 6 months    I  discussed the assessment and treatment plan with the patient. The patient was provided an opportunity to ask questions and all were answered. The patient agreed with the plan and demonstrated an understanding of the instructions.   The patient was advised to call back or seek an in-person evaluation if the symptoms worsen or if the condition fails to improve as anticipated.  I provided 10 minutes of non-face-to-face time during this encounter.  Annitta Needs, PhD, ANP-BC [facility]

## 2018-05-30 NOTE — Telephone Encounter (Signed)
Hi!  Please place patient on RUQ [place] recall for July 2020 due to history of cirrhosis.

## 2018-06-03 ENCOUNTER — Encounter: Payer: Self-pay | Admitting: Internal Medicine

## 2018-06-03 NOTE — Telephone Encounter (Signed)
ADDED TO RECALL LIST

## 2018-06-03 NOTE — Progress Notes (Signed)
CC'ED TO PCP 

## 2018-06-06 ENCOUNTER — Inpatient Hospital Stay (HOSPITAL_COMMUNITY): Payer: PPO | Attending: Hematology

## 2018-06-06 ENCOUNTER — Other Ambulatory Visit: Payer: Self-pay

## 2018-06-06 DIAGNOSIS — Z79899 Other long term (current) drug therapy: Secondary | ICD-10-CM | POA: Insufficient documentation

## 2018-06-06 DIAGNOSIS — G629 Polyneuropathy, unspecified: Secondary | ICD-10-CM | POA: Insufficient documentation

## 2018-06-06 DIAGNOSIS — Z5112 Encounter for antineoplastic immunotherapy: Secondary | ICD-10-CM | POA: Insufficient documentation

## 2018-06-06 DIAGNOSIS — C3490 Malignant neoplasm of unspecified part of unspecified bronchus or lung: Secondary | ICD-10-CM

## 2018-06-06 DIAGNOSIS — E039 Hypothyroidism, unspecified: Secondary | ICD-10-CM | POA: Diagnosis not present

## 2018-06-06 DIAGNOSIS — C3411 Malignant neoplasm of upper lobe, right bronchus or lung: Secondary | ICD-10-CM | POA: Insufficient documentation

## 2018-06-06 LAB — COMPREHENSIVE METABOLIC PANEL
ALT: 15 U/L (ref 0–44)
AST: 22 U/L (ref 15–41)
Albumin: 4 g/dL (ref 3.5–5.0)
Alkaline Phosphatase: 90 U/L (ref 38–126)
Anion gap: 9 (ref 5–15)
BUN: 20 mg/dL (ref 8–23)
CO2: 29 mmol/L (ref 22–32)
Calcium: 9.1 mg/dL (ref 8.9–10.3)
Chloride: 102 mmol/L (ref 98–111)
Creatinine, Ser: 0.88 mg/dL (ref 0.61–1.24)
GFR calc Af Amer: >60 mL/min (ref >60)
GFR calc non Af Amer: >60 mL/min (ref >60)
Glucose, Bld: 110 mg/dL — ABNORMAL HIGH (ref 70–99)
Potassium: 3.6 mmol/L (ref 3.5–5.1)
Sodium: 140 mmol/L (ref 135–145)
Total Bilirubin: 0.6 mg/dL (ref 0.3–1.2)
Total Protein: 7.7 g/dL (ref 6.5–8.1)

## 2018-06-06 LAB — CBC WITH DIFFERENTIAL/PLATELET
Abs Immature Granulocytes: 0.02 10*3/uL (ref 0.00–0.07)
Basophils Absolute: 0 10*3/uL (ref 0.0–0.1)
Basophils Relative: 0 %
Eosinophils Absolute: 0.2 10*3/uL (ref 0.0–0.5)
Eosinophils Relative: 3 %
HCT: 47.2 % (ref 39.0–52.0)
Hemoglobin: 15 g/dL (ref 13.0–17.0)
Immature Granulocytes: 0 %
Lymphocytes Relative: 18 %
Lymphs Abs: 0.9 10*3/uL (ref 0.7–4.0)
MCH: 30.4 pg (ref 26.0–34.0)
MCHC: 31.8 g/dL (ref 30.0–36.0)
MCV: 95.5 fL (ref 80.0–100.0)
Monocytes Absolute: 0.4 10*3/uL (ref 0.1–1.0)
Monocytes Relative: 9 %
Neutro Abs: 3.6 10*3/uL (ref 1.7–7.7)
Neutrophils Relative %: 70 %
Platelets: 186 10*3/uL (ref 150–400)
RBC: 4.94 MIL/uL (ref 4.22–5.81)
RDW: 15.1 % (ref 11.5–15.5)
WBC: 5.1 10*3/uL (ref 4.0–10.5)
nRBC: 0 % (ref 0.0–0.2)

## 2018-06-10 ENCOUNTER — Inpatient Hospital Stay (HOSPITAL_COMMUNITY): Payer: PPO

## 2018-06-10 ENCOUNTER — Inpatient Hospital Stay (HOSPITAL_BASED_OUTPATIENT_CLINIC_OR_DEPARTMENT_OTHER): Payer: PPO | Admitting: Hematology

## 2018-06-10 ENCOUNTER — Encounter (HOSPITAL_COMMUNITY): Payer: Self-pay | Admitting: Hematology

## 2018-06-10 ENCOUNTER — Other Ambulatory Visit: Payer: Self-pay

## 2018-06-10 VITALS — BP 169/84 | HR 99 | Temp 98.0°F | Resp 20 | Wt 176.7 lb

## 2018-06-10 VITALS — BP 145/58 | HR 65 | Temp 97.7°F | Resp 20

## 2018-06-10 DIAGNOSIS — C3411 Malignant neoplasm of upper lobe, right bronchus or lung: Secondary | ICD-10-CM

## 2018-06-10 DIAGNOSIS — Z79899 Other long term (current) drug therapy: Secondary | ICD-10-CM

## 2018-06-10 DIAGNOSIS — E039 Hypothyroidism, unspecified: Secondary | ICD-10-CM | POA: Diagnosis not present

## 2018-06-10 DIAGNOSIS — G629 Polyneuropathy, unspecified: Secondary | ICD-10-CM | POA: Diagnosis not present

## 2018-06-10 DIAGNOSIS — Z5112 Encounter for antineoplastic immunotherapy: Secondary | ICD-10-CM | POA: Diagnosis not present

## 2018-06-10 DIAGNOSIS — C3490 Malignant neoplasm of unspecified part of unspecified bronchus or lung: Secondary | ICD-10-CM

## 2018-06-10 MED ORDER — SODIUM CHLORIDE 0.9 % IV SOLN
10.6000 mg/kg | Freq: Once | INTRAVENOUS | Status: AC
  Administered 2018-06-10: 860 mg via INTRAVENOUS
  Filled 2018-06-10: qty 7.2

## 2018-06-10 MED ORDER — HEPARIN SOD (PORK) LOCK FLUSH 100 UNIT/ML IV SOLN
500.0000 [IU] | Freq: Once | INTRAVENOUS | Status: AC | PRN
  Administered 2018-06-10: 500 [IU]

## 2018-06-10 MED ORDER — SODIUM CHLORIDE 0.9 % IV SOLN
Freq: Once | INTRAVENOUS | Status: AC
  Administered 2018-06-10: 11:00:00 via INTRAVENOUS

## 2018-06-10 MED ORDER — SODIUM CHLORIDE 0.9% FLUSH
10.0000 mL | INTRAVENOUS | Status: DC | PRN
  Administered 2018-06-10: 10 mL
  Filled 2018-06-10: qty 10

## 2018-06-10 NOTE — Progress Notes (Signed)
1040 Lab results from 4/9 reviewed with and pt seen by Dr. Delton Coombes and pt approved for Imfinzi infusion today per MD                                                                     Ian Aguilar tolerated Imfinzi infusion well without complaints or incident. VSS upon discharge. Pt discharged self ambulatory using his cane in satisfactory condition

## 2018-06-10 NOTE — Patient Instructions (Addendum)
[place] at [facility] Discharge Instructions  You were seen today by Dr. Delton Coombes. He went over your recent lab results. He will get you scheduled for scans prior to your next visit. He will see you back in 2 weeks for labs, treatment and follow up.   Thank you for choosing [place] at [facility] to provide your oncology and hematology care.  To afford each patient quality time with our provider, please arrive at least 15 minutes before your scheduled appointment time.   If you have a lab appointment with the [place] please come in thru the  Main Entrance and check in at the main information desk  You need to re-schedule your appointment should you arrive 10 or more minutes late.  We strive to give you quality time with our providers, and arriving late affects you and other patients whose appointments are after yours.  Also, if you no show three or more times for appointments you may be dismissed from the clinic at the providers discretion.     Again, thank you for choosing [facility].  Our hope is that these requests will decrease the amount of time that you wait before being seen by our physicians.       _____________________________________________________________  Should you have questions after your visit to [facility], please contact our office at [phone] between the hours of 8:00 a.m. and 4:30 p.m.  Voicemails left after 4:00 p.m. will not be returned until the following business day.  For prescription refill requests, have your pharmacy contact our office and allow 72 hours.    Cancer Center Support Programs:   > Cancer Support Group  2nd Tuesday of the month 1pm-2pm, Journey Room

## 2018-06-10 NOTE — Assessment & Plan Note (Signed)
1.  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 -Chemoradiation therapy with carboplatin and paclitaxel weekly from 06/11/2017 through 07/16/2017. -CT scan of the chest dated 09/07/2017 showed 10 x 7 mm right upper lobe nodule which has not substantially changed in the interval (previously measured 10 x 8 mm).  Right paratracheal adenopathy is no longer visible.  He got partial response and was recommended immunotherapy with Durvalumab every 2 weeks for 26 treatments based on excellent results from PACIFIC trial. -Durvalumab consolidation started on 09/20/2017.  - CT CAP on 03/22/2018 shows stable appearance of spiculated right apical nodule, some new hazy interstitial accentuation in the right upper lobe in the vicinity of this nodule could be from interval radiation therapy.  3 mm nodule in the right lower lobe is new could be inflammatory.  Hepatic cirrhosis and ascites.  Old wedge compression fracture of L1.  - MRI of the brain on 04/29/2018 done for dizziness did not show any metastatic disease. - His dry cough has improved over time.  He has his baseline cough with slight expectoration at this time. -Denies any fevers.  Denies any diarrhea. -We reviewed his blood work.  He may proceed with next cycle of durvalumab today. -I plan to repeat CT CAP prior to next visit in 2 weeks.  2.  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  No medication needed.  3.  Hypothyroidism: -He is taking Synthroid 100 mcg daily. -Last TSH was 1.35 on 05/24/2018.

## 2018-06-10 NOTE — Progress Notes (Signed)
Ian Aguilar, [place] [address]   CLINIC:  Medical Oncology/Hematology  PCP:  Sharilyn Sites, MD [address] 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  CURRENT THERAPY:Imfinizievery 2 weeks   BRIEF ONCOLOGIC HISTORY:    Carcinoma, lung ([place])   06/05/2017 Initial Diagnosis    Carcinoma, lung ([place])    06/05/2017 - 07/18/2017 Chemotherapy    The patient had palonosetron (ALOXI) injection 0.25 mg, 0.25 mg, Intravenous,  Once, 6 of 6 cycles Administration: 0.25 mg (06/11/2017), 0.25 mg (07/09/2017), 0.25 mg (07/16/2017), 0.25 mg (06/18/2017), 0.25 mg (06/25/2017), 0.25 mg (07/02/2017) CARBOplatin (PARAPLATIN) 190 mg in sodium chloride 0.9 % 250 mL chemo infusion, 190 mg (100 % of original dose 192.8 mg), Intravenous,  Once, 6 of 6 cycles Dose modification:   (original dose 192.8 mg, Cycle 1),   (original dose 192.8 mg, Cycle 5), 144.6 mg (original dose 144.6 mg, Cycle 6),   (original dose 192.8 mg, Cycle 2),   (original dose 192.8 mg, Cycle 3),   (original dose 192.8 mg, Cycle 4) Administration: 190 mg (06/11/2017), 190 mg (07/09/2017), 140 mg (07/16/2017), 190 mg (06/18/2017), 190 mg (06/25/2017), 190 mg (07/02/2017) PACLitaxel (TAXOL) 90 mg in dextrose 5 % 250 mL chemo infusion (</= 23m/m2), 45 mg/m2 = 90 mg, Intravenous,  Once, 6 of 6 cycles Dose modification: 40.5 mg/m2 (90 % of original dose 45 mg/m2, Cycle 5, Reason: Other (see comments), Comment: early neuropathy), 36 mg/m2 (80 % of original dose 45 mg/m2, Cycle 6, Reason: Provider Judgment) Administration: 90 mg (06/11/2017), 78 mg (07/09/2017), 72 mg (07/16/2017), 90 mg (06/18/2017), 78 mg (06/25/2017), 78 mg (07/02/2017)  for chemotherapy treatment.      Malignant neoplasm of bronchus and lung ([place]   06/08/2017 Initial Diagnosis    Malignant neoplasm of bronchus and lung ([facility]    09/20/2017 -  Chemotherapy    The patient had durvalumab  (IMFINZI) 860 mg in sodium chloride 0.9 % 100 mL chemo infusion, 800 mg, Intravenous,  Once, 19 of 24 cycles Administration: 860 mg (09/20/2017), 860 mg (10/04/2017), 860 mg (10/18/2017), 860 mg (11/01/2017), 860 mg (11/15/2017), 860 mg (11/29/2017), 860 mg (12/13/2017), 860 mg (12/27/2017), 860 mg (01/10/2018), 860 mg (01/28/2018), 860 mg (02/11/2018), 860 mg (02/25/2018), 860 mg (03/11/2018), 860 mg (03/25/2018), 860 mg (04/08/2018), 860 mg (04/22/2018), 860 mg (05/08/2018), 860 mg (05/27/2018), 860 mg (06/10/2018)  for chemotherapy treatment.       CANCER STAGING: Cancer Staging No matching staging information was found for the patient.   INTERVAL HISTORY:  Mr. [name]. male returns for routine follow-up and consideration for next cycle of chemotherapy. He is here today alone. He states that he still has some cough but it's not bad. He states that he has numbness occasionally but not all the time. Denies any nausea, vomiting, or diarrhea. Denies any new pains. Had not noticed any recent bleeding such as epistaxis, hematuria or hematochezia. Denies recent chest pain on exertion, pre-syncopal episodes, or palpitations. Denies any numbness or tingling in hands or feet. Denies any recent fevers, infections, or recent hospitalizations. Patient reports appetite at 100% and energy level at 50%.   REVIEW OF SYSTEMS:  Review of Systems  Respiratory: Positive for shortness of breath.   Neurological: Positive for numbness.  Psychiatric/Behavioral: Positive for sleep disturbance.     PAST MEDICAL/SURGICAL HISTORY:  Past Medical History:  Diagnosis Date  . Anxiety   .  Arthritis   . Cirrhosis ([place])    confirmed by MRI on 07/04/11.    Marland Kitchen COPD (chronic obstructive pulmonary disease) ([place])   . Depression with anxiety   . ETOH abuse   . Hyperlipidemia   . Nodule of upper lobe of right lung    with mediastinal adenopathy  . Wears dentures   . Wears glasses    Past Surgical History:  Procedure Laterality  Date  . bilateral cataract surgery     [place]  . broken arm  6 yrs ago   left otif of wrist-[place]  . CLOSED REDUCTION WRIST FRACTURE Right 09/02/2015   Procedure: RIGHT WRIST REDUCTION;  Surgeon: Renette Butters, MD;  Location: [place];  Service: Orthopedics;  Laterality: Right;  with MAC  . COLONOSCOPY  1996   [place]: external hemorrhoids, no polyps  . COLONOSCOPY  06/28/2011   Dr. Ala Bent diverticulosis,tubular adenoma, hyperplastic polyp  . COLONOSCOPY WITH PROPOFOL N/A 04/26/2017   Dr. Gala Romney: perianal and digital rectal examinations normal, diffusely congested colonic mucosa but otherwise normal  . ESOPHAGOGASTRODUODENOSCOPY  06/28/2011   Dr. Chelsea Aus erosive reflux esophagitis, hiatal hernia-gastritis  . ESOPHAGOGASTRODUODENOSCOPY (EGD) WITH PROPOFOL N/A 04/26/2017   Dr. Gala Romney: normal esophagus, small hiatal hernia, GAVE, portal hypertensive gastropathy, normal duodenal bulb and second portion, no specimens collected. 2 years screening   . EXTERNAL EAR SURGERY     right ear-cleaned out ear and created new eardrum  . INGUINAL HERNIA REPAIR  2-3 yrs ago   left-[place]-[facility]  . INTRAMEDULLARY (IM) NAIL INTERTROCHANTERIC Right 09/02/2015   Procedure: RIGHT  INTERTROCHANTRIC HIP;  Surgeon: Renette Butters, MD;  Location: [facility];  Service: Orthopedics;  Laterality: Right;  With MAC  . KNEE SURGERY     left-arthroscopy-[place]  . PORTACATH PLACEMENT Left 06/08/2017   Procedure: INSERTION POWER PORT WITH  ATTACHED CATHETER LEFT SUBCLAVIAN;  Surgeon: Aviva Signs, MD;  [facility];  Service: General;  Laterality: Left;  . SKULL FRACTURE ELEVATION     fractured cheeck bone repaired  . TOTAL HIP ARTHROPLASTY  12/18/2011   Procedure: TOTAL HIP ARTHROPLASTY;  Surgeon: Carole Civil, MD;  [facility]: [facility];  Service: Orthopedics;  Laterality: Left;  Marland Kitchen VIDEO BRONCHOSCOPY WITH ENDOBRONCHIAL ULTRASOUND N/A 05/04/2017   Procedure: VIDEO BRONCHOSCOPY WITH ENDOBRONCHIAL ULTRASOUND;   Surgeon: Melrose Nakayama, MD;  Location: [facility];  Service: Thoracic;  Laterality: N/A;  . WISDOM TOOTH EXTRACTION       SOCIAL HISTORY:  Social History   Socioeconomic History  . Marital status: Married    Spouse name: Not on file  . Number of children: Not on file  . Years of education: Not on file  . Highest education level: Not on file  Occupational History  . Occupation: retired    Fish farm manager: [employer]  Social Needs  . Financial resource strain: Not on file  . Food insecurity:    Worry: Not on file    Inability: Not on file  . Transportation needs:    Medical: Not on file    Non-medical: Not on file  Tobacco Use  . Smoking status: Current Every Day Smoker    Packs/day: 0.50    Years: 62.00    Pack years: 31.00    Types: Cigarettes  . Smokeless tobacco: Never Used  . Tobacco comment: 1/2 ppd > 60 years as of 06/27/17: 7 cigarettes or less a day  Substance and Sexual Activity  . Alcohol use: Not Currently    Comment:  history of ETOH abuse, stopped 1.5 years ago (had one drink [place] [employer]   . Drug use: No  . Sexual activity: Yes    Birth control/protection: None  Lifestyle  . Physical activity:    Days per week: Not on file    Minutes per session: Not on file  . Stress: Not on file  Relationships  . Social connections:    Talks on phone: Not on file    Gets together: Not on file    Attends religious service: Not on file    Active member of club or organization: Not on file    Attends meetings of clubs or organizations: Not on file    Relationship status: Not on file  . Intimate partner violence:    Fear of current or ex partner: Not on file    Emotionally abused: Not on file    Physically abused: Not on file    Forced sexual activity: Not on file  Other Topics Concern  . Not on file  Social History Narrative  . Not on file    FAMILY HISTORY:  Family History  Problem Relation Age of Onset  . Stroke Mother 92  . Heart disease  Father 13       MI  . Diabetes Maternal Uncle   . Colon cancer Neg Hx   . Cancer Neg Hx     CURRENT MEDICATIONS:  Outpatient Encounter Medications as of 06/10/2018  Medication Sig  . cholecalciferol (VITAMIN D) 1000 units tablet Take 1,000 Units by mouth daily.  Hunt Oris (IMFINZI IV) Inject into the vein.  . fluticasone (FLONASE) 50 MCG/ACT nasal spray PLACE 2 SPRAYS IEN QD  . levothyroxine (SYNTHROID, LEVOTHROID) 100 MCG tablet Take 1 tablet (100 mcg total) by mouth daily before breakfast.  . meclizine (ANTIVERT) 25 MG tablet TK 1 T PO  Q 6 H PRN  . Nutritional Supplements (ENSURE ENLIVE PO) Take 237 mLs by mouth daily.   Marland Kitchen PROAIR HFA 108 (90 Base) MCG/ACT inhaler Inhale 2 puffs into the lungs every 6 (six) hours as needed (for shortness of breath/wheezing/cough).   . Propylene Glycol-Glycerin (SOOTHE) 0.6-0.6 % SOLN Place 1-2 drops into both eyes 3 (three) times daily as needed (for dry/irritated eyes.).  Marland Kitchen SPIRIVA HANDIHALER 18 MCG inhalation capsule INHALE CONTENTS OF 1 CAPSULE VIA HANDHALER QD   No facility-administered encounter medications on file as of 06/10/2018.     ALLERGIES:  No Known Allergies   PHYSICAL EXAM:  ECOG Performance status: 1  Vitals:   06/10/18 0929  BP: (!) 169/84  Pulse: 99  Resp: 20  Temp: 98 F (36.7 C)  SpO2: 93%   Filed Weights   06/10/18 0929  Weight: 176 lb 11.2 oz (80.2 kg)    Physical Exam Vitals signs reviewed.  Constitutional:      Appearance: Normal appearance.  Cardiovascular:     Rate and Rhythm: Normal rate and regular rhythm.     Heart sounds: Normal heart sounds.  Pulmonary:     Effort: Pulmonary effort is normal.     Breath sounds: Normal breath sounds.  Abdominal:     General: There is no distension.     Palpations: Abdomen is soft.     Tenderness: There is no abdominal tenderness.  Musculoskeletal:        General: No swelling.  Skin:    General: Skin is warm.  Neurological:     General: No focal deficit  present.     Mental  Status: He is alert and oriented to person, place, and time.  Psychiatric:        Mood and Affect: Mood normal.        Behavior: Behavior normal.      LABORATORY DATA:  I have reviewed the labs as listed.  CBC    Component Value Date/Time   WBC 5.1 06/06/2018 1055   RBC 4.94 06/06/2018 1055   HGB 15.0 06/06/2018 1055   HCT 47.2 06/06/2018 1055   PLT 186 06/06/2018 1055   MCV 95.5 06/06/2018 1055   MCH 30.4 06/06/2018 1055   MCHC 31.8 06/06/2018 1055   RDW 15.1 06/06/2018 1055   LYMPHSABS 0.9 06/06/2018 1055   MONOABS 0.4 06/06/2018 1055   EOSABS 0.2 06/06/2018 1055   BASOSABS 0.0 06/06/2018 1055   CMP Latest Ref Rng & Units 06/06/2018 05/24/2018 05/03/2018  Glucose 70 - 99 mg/dL 110(H) 111(H) 109(H)  BUN 8 - 23 mg/dL _0 Creatinine 0.61 - 1.24 mg/dL 0.88 0.93 0.95  Sodium 135 - 145 mmol/L 140 138 140  Potassium 3.5 - 5.1 mmol/L 3.6 3.6 3.6  Chloride 98 - 111 mmol/L 102 101 103  CO2 22 - 32 mmol/L _1 Calcium 8.9 - 10.3 mg/dL 9.1 9.2 9.0  Total Protein 6.5 - 8.1 g/dL 7.7 7.6 7.5  Total Bilirubin 0.3 - 1.2 mg/dL 0.6 0.6 0.6  Alkaline Phos 38 - 126 U/L 90 83 75  AST 15 - 41 U/L _2 ALT 0 - 44 U/L _3 DIAGNOSTIC IMAGING:  I have independently reviewed the scans and discussed with the patient.   I have reviewed Venita Lick LPN's note and agree with the documentation.  I personally performed a face-to-face visit, made revisions and my assessment and plan is as follows.    ASSESSMENT & PLAN:   Malignant neoplasm of bronchus and lung ([facility]) 1.  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 -Chemoradiation therapy with carboplatin and paclitaxel weekly from 06/11/2017 through 07/16/2017. -CT  scan of the chest dated 09/07/2017 showed 10 x 7 mm right upper lobe nodule which has not substantially changed in the interval (previously measured 10 x 8 mm).  Right paratracheal adenopathy is no longer visible.  He got partial response and was recommended immunotherapy with Durvalumab every 2 weeks for 26 treatments based on excellent results from [facility] trial. -Durvalumab consolidation started on 09/20/2017.  - CT CAP on 03/22/2018 shows stable appearance of spiculated right apical nodule, some new hazy interstitial accentuation in the right upper lobe in the vicinity of this nodule could be from interval radiation therapy.  3 mm nodule in the right lower lobe is new could be inflammatory.  Hepatic cirrhosis and ascites.  Old wedge compression fracture of L1.  - MRI of the brain on 04/29/2018 done for dizziness did not show any metastatic disease. - His dry cough has improved over time.  He has his baseline cough with slight expectoration at this time. -Denies any fevers.  Denies any diarrhea. -We reviewed his blood work.  He may proceed with next cycle of durvalumab today. -I plan to repeat CT CAP prior to next visit in 2 weeks.  2.  Neuropathy: -He has numbness on  and off in both legs from paclitaxel.  No medication needed.  3.  Hypothyroidism: -He is taking Synthroid 100 mcg daily. -Last TSH was 1.35 on 05/24/2018.  Total time spent is 25 minutes with more than 50% of the time spent face-to-face discussing and reinforcing treatment recommendations and coordination of care.    Orders placed this encounter:  Orders Placed This Encounter  Procedures  . CT Abdomen Pelvis W Contrast  . CT Chest W Contrast      Derek Jack, MD [facility] [phone]

## 2018-06-10 NOTE — Patient Instructions (Signed)
[facility] Discharge Instructions for Patients Receiving Chemotherapy   Beginning January 23rd 2017 lab work for the [facility] will be done in the  Main lab at [facility] on 1st floor. If you have a lab appointment with the [facility] please come in thru the  Main Entrance and check in at the main information desk   Today you received the following chemotherapy agents Imfinzi. Follow-up as scheduled. Call clinic for any questions or concerns  To help prevent nausea and vomiting after your treatment, we encourage you to take your nausea medication   If you develop nausea and vomiting, or diarrhea that is not controlled by your medication, call the clinic.  The clinic phone number is [phone]. Office hours are Monday-Friday 8:30am-5:00pm.  BELOW ARE SYMPTOMS THAT SHOULD BE REPORTED IMMEDIATELY:  *FEVER GREATER THAN 101.0 F  *CHILLS WITH OR WITHOUT FEVER  NAUSEA AND VOMITING THAT IS NOT CONTROLLED WITH YOUR NAUSEA MEDICATION  *UNUSUAL SHORTNESS OF BREATH  *UNUSUAL BRUISING OR BLEEDING  TENDERNESS IN MOUTH AND THROAT WITH OR WITHOUT PRESENCE OF ULCERS  *URINARY PROBLEMS  *BOWEL PROBLEMS  UNUSUAL RASH Items with * indicate a potential emergency and should be followed up as soon as possible. If you have an emergency after office hours please contact your primary care physician or go to the nearest emergency department.  Please call the clinic during office hours if you have any questions or concerns.   You may also contact the Patient Navigator at [phone] should you have any questions or need assistance in obtaining follow up care.      Resources For Cancer Patients and their Caregivers ? American Cancer Society: Can assist with transportation, wigs, general needs, runs Look Good Feel Better.        769 235 8261 ? Cancer Care: Provides financial assistance, online support groups, medication/co-pay assistance.  1-800-813-HOPE  305-562-7817) ? [facility] Assists [place] cancer patients and their families through emotional , educational and financial support.  6146714500 ? [place] DSS Where to apply for food stamps, Medicaid and utility assistance. 651-561-3409 ? [facility]: Transportation to medical appointments. (647) 596-9177 ? Social Security Administration: May apply for disability if have a Stage IV cancer. 408-012-7571 231-377-2845 ? [employer], Disability and Transit Services: Assists with nutrition, care and transit needs. [phone]

## 2018-06-11 DIAGNOSIS — I10 Essential (primary) hypertension: Secondary | ICD-10-CM | POA: Diagnosis not present

## 2018-06-11 DIAGNOSIS — Z0000 Encounter for general adult medical examination without abnormal findings: Secondary | ICD-10-CM | POA: Diagnosis not present

## 2018-06-11 DIAGNOSIS — Z1389 Encounter for screening for other disorder: Secondary | ICD-10-CM | POA: Diagnosis not present

## 2018-06-11 DIAGNOSIS — J449 Chronic obstructive pulmonary disease, unspecified: Secondary | ICD-10-CM | POA: Diagnosis not present

## 2018-06-11 DIAGNOSIS — Z4802 Encounter for removal of sutures: Secondary | ICD-10-CM | POA: Diagnosis not present

## 2018-06-11 DIAGNOSIS — Z681 Body mass index (BMI) 19 or less, adult: Secondary | ICD-10-CM | POA: Diagnosis not present

## 2018-06-11 DIAGNOSIS — Z125 Encounter for screening for malignant neoplasm of prostate: Secondary | ICD-10-CM | POA: Diagnosis not present

## 2018-06-11 DIAGNOSIS — E782 Mixed hyperlipidemia: Secondary | ICD-10-CM | POA: Diagnosis not present

## 2018-06-11 DIAGNOSIS — Z0001 Encounter for general adult medical examination with abnormal findings: Secondary | ICD-10-CM | POA: Diagnosis not present

## 2018-06-20 ENCOUNTER — Inpatient Hospital Stay (HOSPITAL_COMMUNITY): Payer: PPO

## 2018-06-20 ENCOUNTER — Ambulatory Visit (HOSPITAL_COMMUNITY)
Admission: RE | Admit: 2018-06-20 | Discharge: 2018-06-20 | Disposition: A | Discharge status: Home / Self Care | Payer: PPO | Source: Ambulatory Visit | Attending: Hematology | Admitting: Hematology

## 2018-06-20 ENCOUNTER — Other Ambulatory Visit: Payer: Self-pay

## 2018-06-20 DIAGNOSIS — C3490 Malignant neoplasm of unspecified part of unspecified bronchus or lung: Secondary | ICD-10-CM

## 2018-06-20 DIAGNOSIS — Z5112 Encounter for antineoplastic immunotherapy: Secondary | ICD-10-CM | POA: Diagnosis not present

## 2018-06-20 DIAGNOSIS — C3411 Malignant neoplasm of upper lobe, right bronchus or lung: Secondary | ICD-10-CM | POA: Diagnosis not present

## 2018-06-20 LAB — COMPREHENSIVE METABOLIC PANEL
ALT: 15 U/L (ref 0–44)
AST: 24 U/L (ref 15–41)
Albumin: 4.3 g/dL (ref 3.5–5.0)
Alkaline Phosphatase: 87 U/L (ref 38–126)
Anion gap: 8 (ref 5–15)
BUN: 15 mg/dL (ref 8–23)
CO2: 30 mmol/L (ref 22–32)
Calcium: 9.1 mg/dL (ref 8.9–10.3)
Chloride: 101 mmol/L (ref 98–111)
Creatinine, Ser: 0.91 mg/dL (ref 0.61–1.24)
GFR calc Af Amer: >60 mL/min (ref >60)
GFR calc non Af Amer: >60 mL/min (ref >60)
Glucose, Bld: 86 mg/dL (ref 70–99)
Potassium: 3.8 mmol/L (ref 3.5–5.1)
Sodium: 139 mmol/L (ref 135–145)
Total Bilirubin: 0.5 mg/dL (ref 0.3–1.2)
Total Protein: 7.9 g/dL (ref 6.5–8.1)

## 2018-06-20 LAB — CBC WITH DIFFERENTIAL/PLATELET
Abs Immature Granulocytes: 0.01 10*3/uL (ref 0.00–0.07)
Basophils Absolute: 0 10*3/uL (ref 0.0–0.1)
Basophils Relative: 0 %
Eosinophils Absolute: 0.2 10*3/uL (ref 0.0–0.5)
Eosinophils Relative: 4 %
HCT: 46.9 % (ref 39.0–52.0)
Hemoglobin: 15.1 g/dL (ref 13.0–17.0)
Immature Granulocytes: 0 %
Lymphocytes Relative: 21 %
Lymphs Abs: 1.1 10*3/uL (ref 0.7–4.0)
MCH: 30.4 pg (ref 26.0–34.0)
MCHC: 32.2 g/dL (ref 30.0–36.0)
MCV: 94.4 fL (ref 80.0–100.0)
Monocytes Absolute: 0.4 10*3/uL (ref 0.1–1.0)
Monocytes Relative: 8 %
Neutro Abs: 3.5 10*3/uL (ref 1.7–7.7)
Neutrophils Relative %: 67 %
Platelets: 187 10*3/uL (ref 150–400)
RBC: 4.97 MIL/uL (ref 4.22–5.81)
RDW: 15.9 % — ABNORMAL HIGH (ref 11.5–15.5)
WBC: 5.3 10*3/uL (ref 4.0–10.5)
nRBC: 0 % (ref 0.0–0.2)

## 2018-06-20 MED ORDER — IOHEXOL 300 MG/ML  SOLN
100.0000 mL | Freq: Once | INTRAMUSCULAR | Status: AC | PRN
  Administered 2018-06-20: 100 mL via INTRAVENOUS

## 2018-06-25 ENCOUNTER — Other Ambulatory Visit: Payer: Self-pay

## 2018-06-25 ENCOUNTER — Inpatient Hospital Stay (HOSPITAL_COMMUNITY): Payer: PPO

## 2018-06-25 ENCOUNTER — Encounter (HOSPITAL_COMMUNITY): Payer: Self-pay | Admitting: Hematology

## 2018-06-25 ENCOUNTER — Inpatient Hospital Stay (HOSPITAL_BASED_OUTPATIENT_CLINIC_OR_DEPARTMENT_OTHER): Payer: PPO | Admitting: Hematology

## 2018-06-25 VITALS — BP 143/65 | HR 59 | Temp 98.2°F | Resp 18

## 2018-06-25 DIAGNOSIS — E039 Hypothyroidism, unspecified: Secondary | ICD-10-CM | POA: Diagnosis not present

## 2018-06-25 DIAGNOSIS — C3411 Malignant neoplasm of upper lobe, right bronchus or lung: Secondary | ICD-10-CM

## 2018-06-25 DIAGNOSIS — C3490 Malignant neoplasm of unspecified part of unspecified bronchus or lung: Secondary | ICD-10-CM

## 2018-06-25 DIAGNOSIS — G629 Polyneuropathy, unspecified: Secondary | ICD-10-CM | POA: Diagnosis not present

## 2018-06-25 DIAGNOSIS — Z79899 Other long term (current) drug therapy: Secondary | ICD-10-CM

## 2018-06-25 DIAGNOSIS — Z5112 Encounter for antineoplastic immunotherapy: Secondary | ICD-10-CM | POA: Diagnosis not present

## 2018-06-25 MED ORDER — SODIUM CHLORIDE 0.9 % IV SOLN
Freq: Once | INTRAVENOUS | Status: AC
  Administered 2018-06-25: 12:00:00 via INTRAVENOUS

## 2018-06-25 MED ORDER — SODIUM CHLORIDE 0.9 % IV SOLN
10.6000 mg/kg | Freq: Once | INTRAVENOUS | Status: AC
  Administered 2018-06-25: 860 mg via INTRAVENOUS
  Filled 2018-06-25: qty 7.2

## 2018-06-25 MED ORDER — SODIUM CHLORIDE 0.9% FLUSH
10.0000 mL | INTRAVENOUS | Status: DC | PRN
  Administered 2018-06-25: 10 mL
  Filled 2018-06-25: qty 10

## 2018-06-25 MED ORDER — HEPARIN SOD (PORK) LOCK FLUSH 100 UNIT/ML IV SOLN
500.0000 [IU] | Freq: Once | INTRAVENOUS | Status: AC | PRN
  Administered 2018-06-25: 500 [IU]

## 2018-06-25 NOTE — Assessment & Plan Note (Signed)
1.  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 -Chemoradiation therapy with carboplatin and paclitaxel weekly from 06/11/2017 through 07/16/2017. -CT scan of the chest dated 09/07/2017 showed 10 x 7 mm right upper lobe nodule which has not substantially changed in the interval (previously measured 10 x 8 mm).  Right paratracheal adenopathy is no longer visible.  He got partial response and was recommended immunotherapy with Durvalumab every 2 weeks for 26 treatments based on excellent results from PACIFIC trial. -Durvalumab consolidation started on 09/20/2017. -MRI of the brain on 04/29/2018 done for dizziness did not show any metastatic disease. - We reviewed results of the CT CAP on 06/20/2018 which shows similar size of spiculated right apical pulmonary nodule with increased surrounding radiation change.  No major changes of metastatic disease. - He is continuing to tolerate immunotherapy very well.  He may proceed with durvalumab today.  I plan to repeat scans and upon completion of his durvalumab.  I will reevaluate him in 4 weeks.  2.  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  No medication is needed.   3.  Hypothyroidism: -Last TSH was 1.35 on 05/24/2018.  He will continue Synthroid 100 mcg daily.

## 2018-06-25 NOTE — Patient Instructions (Signed)
[facility] Discharge Instructions for Patients Receiving Chemotherapy  Today you received the following chemotherapy agents   To help prevent nausea and vomiting after your treatment, we encourage you to take your nausea medication   If you develop nausea and vomiting that is not controlled by your nausea medication, call the clinic.   BELOW ARE SYMPTOMS THAT SHOULD BE REPORTED IMMEDIATELY:  *FEVER GREATER THAN 100.5 F  *CHILLS WITH OR WITHOUT FEVER  NAUSEA AND VOMITING THAT IS NOT CONTROLLED WITH YOUR NAUSEA MEDICATION  *UNUSUAL SHORTNESS OF BREATH  *UNUSUAL BRUISING OR BLEEDING  TENDERNESS IN MOUTH AND THROAT WITH OR WITHOUT PRESENCE OF ULCERS  *URINARY PROBLEMS  *BOWEL PROBLEMS  UNUSUAL RASH Items with * indicate a potential emergency and should be followed up as soon as possible.  Feel free to call the clinic should you have any questions or concerns. The clinic phone number is [phone].  Please show the CHEMO ALERT CARD at check-in to the Emergency Department and triage nurse.   

## 2018-06-25 NOTE — Patient Instructions (Signed)
[facility] Discharge Instructions  You were seen today by Dr. Katragadda. He went over your recent lab results. He will see you back in  for labs and follow up.   Thank you for choosing [facility] at [facility] to provide your oncology and hematology care.  To afford each patient quality time with our provider, please arrive at least 15 minutes before your scheduled appointment time.   If you have a lab appointment with the Cancer Center please come in thru the  Main Entrance and check in at the main information desk  You need to re-schedule your appointment should you arrive 10 or more minutes late.  We strive to give you quality time with our providers, and arriving late affects you and other patients whose appointments are after yours.  Also, if you no show three or more times for appointments you may be dismissed from the clinic at the providers discretion.     Again, thank you for choosing [facility].  Our hope is that these requests will decrease the amount of time that you wait before being seen by our physicians.       _____________________________________________________________  Should you have questions after your visit to [facility], please contact our office at [phone] between the hours of 8:00 a.m. and 4:30 p.m.  Voicemails left after 4:00 p.m. will not be returned until the following business day.  For prescription refill requests, have your pharmacy contact our office and allow 72 hours.    Cancer Center Support Programs:   > Cancer Support Group  2nd Tuesday of the month 1pm-2pm, Journey Room    

## 2018-06-25 NOTE — Progress Notes (Signed)
Ian Aguilar, [place] [address]   CLINIC:  Medical Oncology/Hematology  Aguilar:  Ian Aguilar, [place] [address] [phone]   REASON FOR VISIT:  Follow-up for lung cancer.   BRIEF ONCOLOGIC HISTORY:    Carcinoma, lung ([place])   06/05/2017 Initial Diagnosis    Carcinoma, lung ([place])    06/05/2017 - 07/18/2017 Chemotherapy    The patient had palonosetron (ALOXI) injection 0.25 mg, 0.25 mg, Intravenous,  Once, 6 of 6 cycles Administration: 0.25 mg (06/11/2017), 0.25 mg (07/09/2017), 0.25 mg (07/16/2017), 0.25 mg (06/18/2017), 0.25 mg (06/25/2017), 0.25 mg (07/02/2017) CARBOplatin (PARAPLATIN) 190 mg in sodium chloride 0.9 % 250 mL chemo infusion, 190 mg (100 % of original dose 192.8 mg), Intravenous,  Once, 6 of 6 cycles Dose modification:   (original dose 192.8 mg, Cycle 1),   (original dose 192.8 mg, Cycle 5), 144.6 mg (original dose 144.6 mg, Cycle 6),   (original dose 192.8 mg, Cycle 2),   (original dose 192.8 mg, Cycle 3),   (original dose 192.8 mg, Cycle 4) Administration: 190 mg (06/11/2017), 190 mg (07/09/2017), 140 mg (07/16/2017), 190 mg (06/18/2017), 190 mg (06/25/2017), 190 mg (07/02/2017) PACLitaxel (TAXOL) 90 mg in dextrose 5 % 250 mL chemo infusion (</= 17m/m2), 45 mg/m2 = 90 mg, Intravenous,  Once, 6 of 6 cycles Dose modification: 40.5 mg/m2 (90 % of original dose 45 mg/m2, Cycle 5, Reason: Other (see comments), Comment: early neuropathy), 36 mg/m2 (80 % of original dose 45 mg/m2, Cycle 6, Reason: Provider Judgment) Administration: 90 mg (06/11/2017), 78 mg (07/09/2017), 72 mg (07/16/2017), 90 mg (06/18/2017), 78 mg (06/25/2017), 78 mg (07/02/2017)  for chemotherapy treatment.      Malignant neoplasm of bronchus and lung ([facility]   06/08/2017 Initial Diagnosis    Malignant neoplasm of bronchus and lung ([facility]    09/20/2017 -  Chemotherapy    The patient had durvalumab (IMFINZI) 860 mg in sodium chloride 0.9 % 100 mL chemo infusion, 800  mg, Intravenous,  Once, 20 of 24 cycles Administration: 860 mg (09/20/2017), 860 mg (10/04/2017), 860 mg (10/18/2017), 860 mg (11/01/2017), 860 mg (11/15/2017), 860 mg (11/29/2017), 860 mg (12/13/2017), 860 mg (12/27/2017), 860 mg (01/10/2018), 860 mg (01/28/2018), 860 mg (02/11/2018), 860 mg (02/25/2018), 860 mg (03/11/2018), 860 mg (03/25/2018), 860 mg (04/08/2018), 860 mg (04/22/2018), 860 mg (05/08/2018), 860 mg (05/27/2018), 860 mg (06/10/2018), 860 mg (06/25/2018)  for chemotherapy treatment.       CANCER STAGING: Cancer Staging No matching staging information was found for the patient.   INTERVAL HISTORY:  Ian Aguilar returns for routine follow-up and consideration for next cycle of chemotherapy.  He has mild dry cough but denies any worsening.  He denies any diarrhea.  Denies any skin rashes or itching.  No ER visits or hospitalizations reported.  Energy levels are 50% and appetite is 100%.  Shortness of breath on exertion is stable.    REVIEW OF SYSTEMS:  Review of Systems  Constitutional: Positive for fatigue.  Respiratory: Positive for shortness of breath.   All other systems reviewed and are negative.    PAST MEDICAL/SURGICAL HISTORY:  Past Medical History:  Diagnosis Date  . Anxiety   . Arthritis   . Cirrhosis ([facility]    confirmed by MRI on 07/04/11.    .Marland KitchenCOPD (chronic obstructive pulmonary disease) ([facility]   . Depression with anxiety   . ETOH abuse   . Hyperlipidemia   . Nodule of upper lobe  of right lung    with mediastinal adenopathy  . Wears dentures   . Wears glasses    Past Surgical History:  Procedure Laterality Date  . bilateral cataract surgery     [place]  . broken arm  6 yrs ago   left otif of wrist-[place]  . CLOSED REDUCTION WRIST FRACTURE Right 09/02/2015   Procedure: RIGHT WRIST REDUCTION;  Surgeon: Ian Butters, MD;  Location: [facility];  Service: Orthopedics;  Laterality: Right;  with MAC  . COLONOSCOPY  1996   [place]: external hemorrhoids, no  polyps  . COLONOSCOPY  06/28/2011   Dr. Ala Aguilar diverticulosis,tubular adenoma, hyperplastic polyp  . COLONOSCOPY WITH PROPOFOL N/A 04/26/2017   Dr. Gala Aguilar: perianal and digital rectal examinations normal, diffusely congested colonic mucosa but otherwise normal  . ESOPHAGOGASTRODUODENOSCOPY  06/28/2011   Dr. Chelsea Aguilar erosive reflux esophagitis, hiatal hernia-gastritis  . ESOPHAGOGASTRODUODENOSCOPY (EGD) WITH PROPOFOL N/A 04/26/2017   Dr. Gala Aguilar: normal esophagus, small hiatal hernia, GAVE, portal hypertensive gastropathy, normal duodenal bulb and second portion, no specimens collected. 2 years screening   . EXTERNAL EAR SURGERY     right ear-cleaned out ear and created Ian eardrum  . INGUINAL HERNIA REPAIR  2-3 yrs ago   left-Ian Aguilar  . INTRAMEDULLARY (IM) NAIL INTERTROCHANTERIC Right 09/02/2015   Procedure: RIGHT  INTERTROCHANTRIC HIP;  Surgeon: Ian Butters, MD;  Location: [facility];  Service: Orthopedics;  Laterality: Right;  With MAC  . KNEE SURGERY     left-arthroscopy-[place]  . PORTACATH PLACEMENT Left 06/08/2017   Procedure: INSERTION POWER PORT WITH  ATTACHED CATHETER LEFT SUBCLAVIAN;  Surgeon: Ian Signs, MD;  Location: [facility];  Service: General;  Laterality: Left;  . SKULL FRACTURE ELEVATION     fractured cheeck bone repaired  . TOTAL HIP ARTHROPLASTY  12/18/2011   Procedure: TOTAL HIP ARTHROPLASTY;  Surgeon: Ian Civil, MD;  Location: [facility];  Service: Orthopedics;  Laterality: Left;  Marland Kitchen VIDEO BRONCHOSCOPY WITH ENDOBRONCHIAL ULTRASOUND N/A 05/04/2017   Procedure: VIDEO BRONCHOSCOPY WITH ENDOBRONCHIAL ULTRASOUND;  Surgeon: Ian Nakayama, MD;  Location: [facility];  Service: Thoracic;  Laterality: N/A;  . WISDOM TOOTH EXTRACTION       SOCIAL HISTORY:  Social History   Socioeconomic History  . Marital status: Married    Spouse name: Not on file  . Number of children: Not on file  . Years of education: Not on file  . Highest education level: Not on file   Occupational History  . Occupation: retired    Fish farm manager: [employer]  Social Needs  . Financial resource strain: Not on file  . Food insecurity:    Worry: Not on file    Inability: Not on file  . Transportation needs:    Medical: Not on file    Non-medical: Not on file  Tobacco Use  . Smoking status: Current Every Day Smoker    Packs/day: 0.50    Years: 62.00    Pack years: 31.00    Types: Cigarettes  . Smokeless tobacco: Never Used  . Tobacco comment: 1/2 ppd > 60 years as of 06/27/17: 7 cigarettes or less a day  Substance and Sexual Activity  . Alcohol use: Not Currently    Comment: history of ETOH abuse, stopped 1.5 years ago (had one drink at [place] [employer]).   . Drug use: No  . Sexual activity: Yes    Birth control/protection: None  Lifestyle  . Physical activity:    Days per week: Not on file  Minutes per session: Not on file  . Stress: Not on file  Relationships  . Social connections:    Talks on phone: Not on file    Gets together: Not on file    Attends religious service: Not on file    Active member of club or organization: Not on file    Attends meetings of clubs or organizations: Not on file    Relationship status: Not on file  . Intimate partner violence:    Fear of current or ex partner: Not on file    Emotionally abused: Not on file    Physically abused: Not on file    Forced sexual activity: Not on file  Other Topics Concern  . Not on file  Social History Narrative  . Not on file    FAMILY HISTORY:  Family History  Problem Relation Age of Onset  . Stroke Mother 18  . Heart disease Father 93       MI  . Diabetes Maternal Uncle   . Colon cancer Neg Hx   . Cancer Neg Hx     CURRENT MEDICATIONS:  Outpatient Encounter Medications as of 06/25/2018  Medication Sig  . cholecalciferol (VITAMIN D) 1000 units tablet Take 1,000 Units by mouth daily.  [place]unt Oris (IMFINZI IV) Inject into the vein.  . fluticasone (FLONASE) 50 MCG/ACT  nasal spray PLACE 2 SPRAYS IEN QD  . levothyroxine (SYNTHROID, LEVOTHROID) 100 MCG tablet Take 1 tablet (100 mcg total) by mouth daily before breakfast.  . meclizine (ANTIVERT) 25 MG tablet TK 1 T PO  Q 6 H PRN  . Nutritional Supplements (ENSURE ENLIVE PO) Take 237 mLs by mouth daily.   Marland Kitchen PROAIR HFA 108 (90 Base) MCG/ACT inhaler Inhale 2 puffs into the lungs every 6 (six) hours as needed (for shortness of breath/wheezing/cough).   . Propylene Glycol-Glycerin (SOOTHE) 0.6-0.6 % SOLN Place 1-2 drops into both eyes 3 (three) times daily as needed (for dry/irritated eyes.).  Marland Kitchen SPIRIVA HANDIHALER 18 MCG inhalation capsule INHALE CONTENTS OF 1 CAPSULE VIA HANDHALER QD   No facility-administered encounter medications on file as of 06/25/2018.     ALLERGIES:  No Known Allergies   PHYSICAL EXAM:  ECOG Performance status: 1  Vitals:   06/25/18 1008  BP: (!) 163/63  Pulse: 94  Resp: 20  Temp: 97.7 F (36.5 C)  SpO2: 92%   Filed Weights   06/25/18 1008  Weight: 177 lb (80.3 kg)    Physical Exam Vitals Aguilar reviewed.  Constitutional:      Appearance: Normal appearance.  Cardiovascular:     Rate and Rhythm: Normal rate and regular rhythm.     Heart sounds: Normal heart sounds.  Pulmonary:     Effort: Pulmonary effort is normal.     Breath sounds: Normal breath sounds.  Abdominal:     General: There is no distension.     Palpations: Abdomen is soft. There is no mass.  Skin:    General: Skin is warm.  Neurological:     General: No focal deficit present.     Mental Status: He is alert and oriented to person, place, and time.  Psychiatric:        Mood and Affect: Mood normal.        Behavior: Behavior normal.      LABORATORY DATA:  I have reviewed the labs as listed.  CBC    Component Value Date/Time   WBC 5.3 06/20/2018 1548   RBC 4.97 06/20/2018 1548  HGB 15.1 06/20/2018 1548   HCT 46.9 06/20/2018 1548   PLT 187 06/20/2018 1548   MCV 94.4 06/20/2018 1548   MCH  30.4 06/20/2018 1548   MCHC 32.2 06/20/2018 1548   RDW 15.9 (H) 06/20/2018 1548   LYMPHSABS 1.1 06/20/2018 1548   MONOABS 0.4 06/20/2018 1548   EOSABS 0.2 06/20/2018 1548   BASOSABS 0.0 06/20/2018 1548   CMP Latest Ref Rng & Units 06/20/2018 06/06/2018 05/24/2018  Glucose 70 - 99 mg/dL 86 110(H) 111(H)  BUN 8 - 23 mg/dL _0 Creatinine 0.61 - 1.24 mg/dL 0.91 0.88 0.93  Sodium 135 - 145 mmol/L 139 140 138  Potassium 3.5 - 5.1 mmol/L 3.8 3.6 3.6  Chloride 98 - 111 mmol/L 101 102 101  CO2 22 - 32 mmol/L _1 Calcium 8.9 - 10.3 mg/dL 9.1 9.1 9.2  Total Protein 6.5 - 8.1 g/dL 7.9 7.7 7.6  Total Bilirubin 0.3 - 1.2 mg/dL 0.5 0.6 0.6  Alkaline Phos 38 - 126 U/L 87 90 83  AST 15 - 41 U/L _2 ALT 0 - 44 U/L _3 DIAGNOSTIC IMAGING:  I have independently reviewed the scans and discussed with the patient.   I have reviewed Venita Lick LPN's note and agree with the documentation.  I personally performed a face-to-face visit, made revisions and my assessment and plan is as follows.    ASSESSMENT & PLAN:   Malignant neoplasm of bronchus and lung (HCC) 1.  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 -Chemoradiation therapy with carboplatin and paclitaxel weekly from 06/11/2017 through 07/16/2017. -CT scan of the chest dated 09/07/2017 showed 10 x 7 mm right upper lobe nodule which has not substantially changed in the interval (previously measured 10 x 8 mm).  Right paratracheal adenopathy is no longer visible.  He got partial response and was recommended immunotherapy with Durvalumab every 2 weeks for 26 treatments based on excellent results from [facility] trial. -Durvalumab consolidation started on 09/20/2017. -MRI of the brain on  04/29/2018 done for dizziness did not show any metastatic disease. - We reviewed results of the CT CAP on 06/20/2018 which shows similar size of spiculated right apical pulmonary nodule with increased surrounding radiation change.  No major changes of metastatic disease. - He is continuing to tolerate immunotherapy very well.  He may proceed with durvalumab today.  I plan to repeat scans and upon completion of his durvalumab.  I will reevaluate him in 4 weeks.  2.  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  No medication is needed.   3.  Hypothyroidism: -Last TSH was 1.35 on 05/24/2018.  He will continue Synthroid 100 mcg daily.   Total time spent is 25 minutes with more than 50% of the time spent face-to-face discussing scan results, treatment recommendations and coordination of care.    Orders placed this encounter:  No orders of the defined types were placed in this encounter.     Derek Jack, MD [place] [phone]

## 2018-07-01 ENCOUNTER — Ambulatory Visit: Payer: PPO | Admitting: Gastroenterology

## 2018-07-08 ENCOUNTER — Inpatient Hospital Stay (HOSPITAL_COMMUNITY): Payer: PPO | Attending: Hematology

## 2018-07-08 ENCOUNTER — Other Ambulatory Visit: Payer: Self-pay

## 2018-07-08 DIAGNOSIS — Z5112 Encounter for antineoplastic immunotherapy: Secondary | ICD-10-CM | POA: Diagnosis not present

## 2018-07-08 DIAGNOSIS — R05 Cough: Secondary | ICD-10-CM | POA: Insufficient documentation

## 2018-07-08 DIAGNOSIS — J449 Chronic obstructive pulmonary disease, unspecified: Secondary | ICD-10-CM | POA: Diagnosis not present

## 2018-07-08 DIAGNOSIS — E039 Hypothyroidism, unspecified: Secondary | ICD-10-CM | POA: Insufficient documentation

## 2018-07-08 DIAGNOSIS — Z8249 Family history of ischemic heart disease and other diseases of the circulatory system: Secondary | ICD-10-CM | POA: Insufficient documentation

## 2018-07-08 DIAGNOSIS — R0602 Shortness of breath: Secondary | ICD-10-CM | POA: Diagnosis not present

## 2018-07-08 DIAGNOSIS — F1011 Alcohol abuse, in remission: Secondary | ICD-10-CM | POA: Insufficient documentation

## 2018-07-08 DIAGNOSIS — Z9221 Personal history of antineoplastic chemotherapy: Secondary | ICD-10-CM | POA: Insufficient documentation

## 2018-07-08 DIAGNOSIS — Z79899 Other long term (current) drug therapy: Secondary | ICD-10-CM | POA: Insufficient documentation

## 2018-07-08 DIAGNOSIS — Z833 Family history of diabetes mellitus: Secondary | ICD-10-CM | POA: Diagnosis not present

## 2018-07-08 DIAGNOSIS — C3411 Malignant neoplasm of upper lobe, right bronchus or lung: Secondary | ICD-10-CM | POA: Insufficient documentation

## 2018-07-08 DIAGNOSIS — M1990 Unspecified osteoarthritis, unspecified site: Secondary | ICD-10-CM | POA: Insufficient documentation

## 2018-07-08 DIAGNOSIS — Z79890 Hormone replacement therapy (postmenopausal): Secondary | ICD-10-CM | POA: Insufficient documentation

## 2018-07-08 DIAGNOSIS — K7460 Unspecified cirrhosis of liver: Secondary | ICD-10-CM | POA: Diagnosis not present

## 2018-07-08 DIAGNOSIS — Z823 Family history of stroke: Secondary | ICD-10-CM | POA: Diagnosis not present

## 2018-07-08 DIAGNOSIS — G629 Polyneuropathy, unspecified: Secondary | ICD-10-CM | POA: Diagnosis not present

## 2018-07-08 DIAGNOSIS — F17210 Nicotine dependence, cigarettes, uncomplicated: Secondary | ICD-10-CM | POA: Insufficient documentation

## 2018-07-08 DIAGNOSIS — C3490 Malignant neoplasm of unspecified part of unspecified bronchus or lung: Secondary | ICD-10-CM

## 2018-07-08 LAB — COMPREHENSIVE METABOLIC PANEL
ALT: 14 U/L (ref 0–44)
AST: 22 U/L (ref 15–41)
Albumin: 4 g/dL (ref 3.5–5.0)
Alkaline Phosphatase: 84 U/L (ref 38–126)
Anion gap: 8 (ref 5–15)
BUN: 17 mg/dL (ref 8–23)
CO2: 30 mmol/L (ref 22–32)
Calcium: 9.1 mg/dL (ref 8.9–10.3)
Chloride: 101 mmol/L (ref 98–111)
Creatinine, Ser: 0.78 mg/dL (ref 0.61–1.24)
GFR calc Af Amer: >60 mL/min (ref >60)
GFR calc non Af Amer: >60 mL/min (ref >60)
Glucose, Bld: 79 mg/dL (ref 70–99)
Potassium: 4.1 mmol/L (ref 3.5–5.1)
Sodium: 139 mmol/L (ref 135–145)
Total Bilirubin: 0.7 mg/dL (ref 0.3–1.2)
Total Protein: 7.3 g/dL (ref 6.5–8.1)

## 2018-07-08 LAB — CBC WITH DIFFERENTIAL/PLATELET
Abs Immature Granulocytes: 0.01 10*3/uL (ref 0.00–0.07)
Basophils Absolute: 0 10*3/uL (ref 0.0–0.1)
Basophils Relative: 1 %
Eosinophils Absolute: 0.1 10*3/uL (ref 0.0–0.5)
Eosinophils Relative: 3 %
HCT: 45.6 % (ref 39.0–52.0)
Hemoglobin: 14.6 g/dL (ref 13.0–17.0)
Immature Granulocytes: 0 %
Lymphocytes Relative: 14 %
Lymphs Abs: 0.7 10*3/uL (ref 0.7–4.0)
MCH: 30.1 pg (ref 26.0–34.0)
MCHC: 32 g/dL (ref 30.0–36.0)
MCV: 94 fL (ref 80.0–100.0)
Monocytes Absolute: 0.5 10*3/uL (ref 0.1–1.0)
Monocytes Relative: 9 %
Neutro Abs: 3.9 10*3/uL (ref 1.7–7.7)
Neutrophils Relative %: 73 %
Platelets: 182 10*3/uL (ref 150–400)
RBC: 4.85 MIL/uL (ref 4.22–5.81)
RDW: 16.2 % — ABNORMAL HIGH (ref 11.5–15.5)
WBC: 5.3 10*3/uL (ref 4.0–10.5)
nRBC: 0 % (ref 0.0–0.2)

## 2018-07-09 ENCOUNTER — Inpatient Hospital Stay (HOSPITAL_COMMUNITY): Payer: PPO

## 2018-07-09 ENCOUNTER — Encounter (HOSPITAL_COMMUNITY): Payer: Self-pay

## 2018-07-09 VITALS — BP 141/76 | HR 68 | Temp 97.7°F | Resp 18

## 2018-07-09 DIAGNOSIS — C3490 Malignant neoplasm of unspecified part of unspecified bronchus or lung: Secondary | ICD-10-CM

## 2018-07-09 DIAGNOSIS — Z5112 Encounter for antineoplastic immunotherapy: Secondary | ICD-10-CM | POA: Diagnosis not present

## 2018-07-09 MED ORDER — HEPARIN SOD (PORK) LOCK FLUSH 100 UNIT/ML IV SOLN
500.0000 [IU] | Freq: Once | INTRAVENOUS | Status: AC | PRN
  Administered 2018-07-09: 500 [IU]

## 2018-07-09 MED ORDER — SODIUM CHLORIDE 0.9 % IV SOLN
Freq: Once | INTRAVENOUS | Status: AC
  Administered 2018-07-09: 13:00:00 via INTRAVENOUS

## 2018-07-09 MED ORDER — SODIUM CHLORIDE 0.9 % IV SOLN
10.6000 mg/kg | Freq: Once | INTRAVENOUS | Status: AC
  Administered 2018-07-09: 860 mg via INTRAVENOUS
  Filled 2018-07-09: qty 7.2

## 2018-07-09 MED ORDER — SODIUM CHLORIDE 0.9% FLUSH
10.0000 mL | INTRAVENOUS | Status: DC | PRN
  Administered 2018-07-09: 10 mL
  Filled 2018-07-09: qty 10

## 2018-07-09 NOTE — Progress Notes (Signed)
To treatment room for imfinzi with no complaints voiced.  Labs checked.   Patient tolerated chemotherapy with no complaints voiced.  Port site clean and dry with no bruising or swelling noted at site.  Good blood return noted before and after administration of chemotherapy.  Band aid applied.  Patient left ambulatory with VSS and no s/s of distress noted.

## 2018-07-19 ENCOUNTER — Other Ambulatory Visit: Payer: Self-pay

## 2018-07-19 ENCOUNTER — Inpatient Hospital Stay (HOSPITAL_COMMUNITY): Payer: PPO

## 2018-07-19 DIAGNOSIS — Z5112 Encounter for antineoplastic immunotherapy: Secondary | ICD-10-CM | POA: Diagnosis not present

## 2018-07-19 DIAGNOSIS — C3490 Malignant neoplasm of unspecified part of unspecified bronchus or lung: Secondary | ICD-10-CM

## 2018-07-19 LAB — CBC WITH DIFFERENTIAL/PLATELET
Abs Immature Granulocytes: 0.01 10*3/uL (ref 0.00–0.07)
Basophils Absolute: 0 10*3/uL (ref 0.0–0.1)
Basophils Relative: 1 %
Eosinophils Absolute: 0.4 10*3/uL (ref 0.0–0.5)
Eosinophils Relative: 8 %
HCT: 43.9 % (ref 39.0–52.0)
Hemoglobin: 14.1 g/dL (ref 13.0–17.0)
Immature Granulocytes: 0 %
Lymphocytes Relative: 14 %
Lymphs Abs: 0.8 10*3/uL (ref 0.7–4.0)
MCH: 30.7 pg (ref 26.0–34.0)
MCHC: 32.1 g/dL (ref 30.0–36.0)
MCV: 95.4 fL (ref 80.0–100.0)
Monocytes Absolute: 0.5 10*3/uL (ref 0.1–1.0)
Monocytes Relative: 9 %
Neutro Abs: 4 10*3/uL (ref 1.7–7.7)
Neutrophils Relative %: 68 %
Platelets: 178 10*3/uL (ref 150–400)
RBC: 4.6 MIL/uL (ref 4.22–5.81)
RDW: 16.4 % — ABNORMAL HIGH (ref 11.5–15.5)
WBC: 5.7 10*3/uL (ref 4.0–10.5)
nRBC: 0 % (ref 0.0–0.2)

## 2018-07-19 LAB — COMPREHENSIVE METABOLIC PANEL
ALT: 16 U/L (ref 0–44)
AST: 25 U/L (ref 15–41)
Albumin: 4 g/dL (ref 3.5–5.0)
Alkaline Phosphatase: 82 U/L (ref 38–126)
Anion gap: 9 (ref 5–15)
BUN: 14 mg/dL (ref 8–23)
CO2: 31 mmol/L (ref 22–32)
Calcium: 8.8 mg/dL — ABNORMAL LOW (ref 8.9–10.3)
Chloride: 100 mmol/L (ref 98–111)
Creatinine, Ser: 0.94 mg/dL (ref 0.61–1.24)
GFR calc Af Amer: >60 mL/min (ref >60)
GFR calc non Af Amer: >60 mL/min (ref >60)
Glucose, Bld: 118 mg/dL — ABNORMAL HIGH (ref 70–99)
Potassium: 3.9 mmol/L (ref 3.5–5.1)
Sodium: 140 mmol/L (ref 135–145)
Total Bilirubin: 0.9 mg/dL (ref 0.3–1.2)
Total Protein: 7 g/dL (ref 6.5–8.1)

## 2018-07-23 ENCOUNTER — Inpatient Hospital Stay (HOSPITAL_COMMUNITY): Payer: PPO

## 2018-07-23 ENCOUNTER — Inpatient Hospital Stay (HOSPITAL_BASED_OUTPATIENT_CLINIC_OR_DEPARTMENT_OTHER): Payer: PPO | Admitting: Hematology

## 2018-07-23 ENCOUNTER — Encounter (HOSPITAL_COMMUNITY): Payer: Self-pay | Admitting: Hematology

## 2018-07-23 ENCOUNTER — Other Ambulatory Visit: Payer: Self-pay

## 2018-07-23 VITALS — BP 152/76 | HR 86 | Temp 98.0°F | Resp 21 | Wt 174.2 lb

## 2018-07-23 VITALS — BP 159/72 | HR 63 | Temp 98.6°F | Resp 18

## 2018-07-23 DIAGNOSIS — Z9221 Personal history of antineoplastic chemotherapy: Secondary | ICD-10-CM

## 2018-07-23 DIAGNOSIS — Z833 Family history of diabetes mellitus: Secondary | ICD-10-CM

## 2018-07-23 DIAGNOSIS — F17210 Nicotine dependence, cigarettes, uncomplicated: Secondary | ICD-10-CM

## 2018-07-23 DIAGNOSIS — Z79899 Other long term (current) drug therapy: Secondary | ICD-10-CM

## 2018-07-23 DIAGNOSIS — C3490 Malignant neoplasm of unspecified part of unspecified bronchus or lung: Secondary | ICD-10-CM

## 2018-07-23 DIAGNOSIS — K7460 Unspecified cirrhosis of liver: Secondary | ICD-10-CM | POA: Diagnosis not present

## 2018-07-23 DIAGNOSIS — M1990 Unspecified osteoarthritis, unspecified site: Secondary | ICD-10-CM | POA: Diagnosis not present

## 2018-07-23 DIAGNOSIS — Z823 Family history of stroke: Secondary | ICD-10-CM

## 2018-07-23 DIAGNOSIS — Z8249 Family history of ischemic heart disease and other diseases of the circulatory system: Secondary | ICD-10-CM

## 2018-07-23 DIAGNOSIS — F1011 Alcohol abuse, in remission: Secondary | ICD-10-CM | POA: Diagnosis not present

## 2018-07-23 DIAGNOSIS — R0602 Shortness of breath: Secondary | ICD-10-CM

## 2018-07-23 DIAGNOSIS — Z79890 Hormone replacement therapy (postmenopausal): Secondary | ICD-10-CM

## 2018-07-23 DIAGNOSIS — C3411 Malignant neoplasm of upper lobe, right bronchus or lung: Secondary | ICD-10-CM

## 2018-07-23 DIAGNOSIS — Z5112 Encounter for antineoplastic immunotherapy: Secondary | ICD-10-CM | POA: Diagnosis not present

## 2018-07-23 DIAGNOSIS — C3491 Malignant neoplasm of unspecified part of right bronchus or lung: Secondary | ICD-10-CM

## 2018-07-23 DIAGNOSIS — E039 Hypothyroidism, unspecified: Secondary | ICD-10-CM

## 2018-07-23 DIAGNOSIS — G629 Polyneuropathy, unspecified: Secondary | ICD-10-CM

## 2018-07-23 DIAGNOSIS — R05 Cough: Secondary | ICD-10-CM | POA: Diagnosis not present

## 2018-07-23 DIAGNOSIS — J449 Chronic obstructive pulmonary disease, unspecified: Secondary | ICD-10-CM

## 2018-07-23 MED ORDER — SODIUM CHLORIDE 0.9 % IV SOLN
10.6000 mg/kg | Freq: Once | INTRAVENOUS | Status: AC
  Administered 2018-07-23: 860 mg via INTRAVENOUS
  Filled 2018-07-23: qty 10

## 2018-07-23 MED ORDER — HEPARIN SOD (PORK) LOCK FLUSH 100 UNIT/ML IV SOLN
500.0000 [IU] | Freq: Once | INTRAVENOUS | Status: AC | PRN
  Administered 2018-07-23: 500 [IU]

## 2018-07-23 MED ORDER — SODIUM CHLORIDE 0.9 % IV SOLN
Freq: Once | INTRAVENOUS | Status: AC
  Administered 2018-07-23: 11:00:00 via INTRAVENOUS

## 2018-07-23 MED ORDER — SODIUM CHLORIDE 0.9% FLUSH
10.0000 mL | INTRAVENOUS | Status: DC | PRN
  Administered 2018-07-23: 10 mL
  Filled 2018-07-23: qty 10

## 2018-07-23 NOTE — Assessment & Plan Note (Signed)
1.  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 -Chemoradiation therapy with carboplatin and paclitaxel weekly from 06/11/2017 through 07/16/2017. -CT scan of the chest dated 09/07/2017 showed 10 x 7 mm right upper lobe nodule which has not substantially changed in the interval (previously measured 10 x 8 mm).  Right paratracheal adenopathy is no longer visible.  He got partial response and was recommended immunotherapy with Durvalumab every 2 weeks for 26 treatments based on excellent results from [facility]IFIC trial. -Durvalumab consolidation started on 09/20/2017. -MRI of the brain on 04/29/2018 done for dizziness did not show any metastatic disease. -CT CAP on 06/20/2018 shows similar size of spiculated right apical pulmonary nodule with increased surrounding radiation change.  No major changes of metastatic disease.   - He reported worsening shortness of breath in the last few weeks.  This is mainly on exertion.  He also had episodes where he had to wake up in the middle of the night feeling short of breath.  Never had any history of heart problems. -I have recommended doing a 2D echocardiogram. -His oxygen saturations are 93% on room air.  He is going to stop by Dr. Delanna Ahmadi office today to have saturations checked on exertion to see if he qualifies for oxygen. -If there is no clear reason, will consider repeating CT of the chest to rule out pneumonitis.  2.  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  No medication is needed.   3.  Hypothyroidism: -Last TSH was 1.35 on 05/24/2018.  He will continue Synthroid 100 mcg daily.

## 2018-07-23 NOTE — Progress Notes (Signed)
Ian Aguilar, [place] [id number]   CLINIC:  Medical Oncology/Hematology  PCP:  Sharilyn Sites, [place] [address] [phone]   REASON FOR VISIT:  Follow-up for lung cancer.   BRIEF ONCOLOGIC HISTORY:    Carcinoma, lung ([place])   06/05/2017 Initial Diagnosis    Carcinoma, lung ([place])    06/05/2017 - 07/18/2017 Chemotherapy    The patient had palonosetron (ALOXI) injection 0.25 mg, 0.25 mg, Intravenous,  Once, 6 of 6 cycles Administration: 0.25 mg (06/11/2017), 0.25 mg (07/09/2017), 0.25 mg (07/16/2017), 0.25 mg (06/18/2017), 0.25 mg (06/25/2017), 0.25 mg (07/02/2017) CARBOplatin (PARAPLATIN) 190 mg in sodium chloride 0.9 % 250 mL chemo infusion, 190 mg (100 % of original dose 192.8 mg), Intravenous,  Once, 6 of 6 cycles Dose modification:   (original dose 192.8 mg, Cycle 1),   (original dose 192.8 mg, Cycle 5), 144.6 mg (original dose 144.6 mg, Cycle 6),   (original dose 192.8 mg, Cycle 2),   (original dose 192.8 mg, Cycle 3),   (original dose 192.8 mg, Cycle 4) Administration: 190 mg (06/11/2017), 190 mg (07/09/2017), 140 mg (07/16/2017), 190 mg (06/18/2017), 190 mg (06/25/2017), 190 mg (07/02/2017) PACLitaxel (TAXOL) 90 mg in dextrose 5 % 250 mL chemo infusion (</= 54m/m2), 45 mg/m2 = 90 mg, Intravenous,  Once, 6 of 6 cycles Dose modification: 40.5 mg/m2 (90 % of original dose 45 mg/m2, Cycle 5, Reason: Other (see comments), Comment: early neuropathy), 36 mg/m2 (80 % of original dose 45 mg/m2, Cycle 6, Reason: Provider Judgment) Administration: 90 mg (06/11/2017), 78 mg (07/09/2017), 72 mg (07/16/2017), 90 mg (06/18/2017), 78 mg (06/25/2017), 78 mg (07/02/2017)  for chemotherapy treatment.      Malignant ne[place] of bronchus and lung ([place]   06/08/2017 Initial Diagnosis    Malignant [place] of bronchus and lung ([place]    09/20/2017 -  Chemotherapy    The patient had durvalumab (IMFINZI) 860 mg in sodium chloride 0.9 % 100 mL chemo infusion, 800  mg, Intravenous,  Once, 22 of 26 cycles Administration: 860 mg (09/20/2017), 860 mg (10/04/2017), 860 mg (10/18/2017), 860 mg (11/01/2017), 860 mg (11/15/2017), 860 mg (11/29/2017), 860 mg (12/13/2017), 860 mg (12/27/2017), 860 mg (01/10/2018), 860 mg (01/28/2018), 860 mg (02/11/2018), 860 mg (02/25/2018), 860 mg (03/11/2018), 860 mg (03/25/2018), 860 mg (04/08/2018), 860 mg (04/22/2018), 860 mg (05/08/2018), 860 mg (05/27/2018), 860 mg (06/10/2018), 860 mg (06/25/2018), 860 mg (07/09/2018), 860 mg (07/23/2018)  for chemotherapy treatment.       CANCER STAGING: Cancer Staging No matching staging information was found for the patient.   INTERVAL HISTORY:  Ian Aguilar returns for follow-up of his lung cancer.  He is receiving durvalumab maintenance.  He reported worsening of shortness of breath on exertion for the last 3 to 4 weeks.  He also woke up in the middle of the night feeling short of breath few times.  He is able to lie flat without any problems.  Denies any cough or expectoration which is new.  No fevers or chills reported.  Denies any ER visits or hospitalizations.  Appetite is 100% and energy level is 50%.    REVIEW OF SYSTEMS:  Review of Systems  Constitutional: Negative for fatigue.  Respiratory: Positive for cough and shortness of breath.   Neurological: Positive for numbness.  All other systems reviewed and are negative.    PAST MEDICAL/SURGICAL HISTORY:  Past Medical History:  Diagnosis Date  . Anxiety   . Arthritis   .  Cirrhosis ([place])    confirmed by MRI on 07/04/11.    Marland Kitchen COPD (chronic obstructive pulmonary disease) ([place])   . Depression with anxiety   . ETOH abuse   . Hyperlipidemia   . Nodule of upper lobe of right lung    with mediastinal adenopathy  . Wears dentures   . Wears glasses    Past Surgical History:  Procedure Laterality Date  . bilateral cataract surgery     [place]  . broken arm  6 yrs ago   left otif of wrist-[facility]  . CLOSED REDUCTION WRIST  FRACTURE Right 09/02/2015   Procedure: RIGHT WRIST REDUCTION;  Surgeon: Renette Butters, MD;  Location: [facility];  Service: Orthopedics;  Laterality: Right;  with MAC  . COLONOSCOPY  1996   [facility]: external hemorrhoids, no polyps  . COLONOSCOPY  06/28/2011   Dr. Ala Bent diverticulosis,tubular adenoma, hyperplastic polyp  . COLONOSCOPY WITH PROPOFOL N/A 04/26/2017   Dr. Gala Romney: perianal and digital rectal examinations normal, diffusely congested colonic mucosa but otherwise normal  . ESOPHAGOGASTRODUODENOSCOPY  06/28/2011   Dr. Chelsea Aus erosive reflux esophagitis, hiatal hernia-gastritis  . ESOPHAGOGASTRODUODENOSCOPY (EGD) WITH PROPOFOL N/A 04/26/2017   Dr. Gala Romney: normal esophagus, small hiatal hernia, GAVE, portal hypertensive gastropathy, normal duodenal bulb and second portion, no specimens collected. 2 years screening   . EXTERNAL EAR SURGERY     right ear-cleaned out ear and created new eardrum  . INGUINAL HERNIA REPAIR  2-3 yrs ago   left-[facility]_  . INTRAMEDULLARY (IM) NAIL INTERTROCHANTERIC Right 09/02/2015   Procedure: RIGHT  INTERTROCHANTRIC HIP;  Surgeon: Renette Butters, MD;  Location: [facility];  Service: Orthopedics;  Laterality: Right;  With MAC  . KNEE SURGERY     left-arthroscopy-[facility]  . PORTACATH PLACEMENT Left 06/08/2017   Procedure: INSERTION POWER PORT WITH  ATTACHED CATHETER LEFT SUBCLAVIAN;  Surgeon: Aviva Signs, MD;  Location: [facility];  Service: General;  Laterality: Left;  . SKULL FRACTURE ELEVATION     fractured cheeck bone repaired  . TOTAL HIP ARTHROPLASTY  12/18/2011   Procedure: TOTAL HIP ARTHROPLASTY;  Surgeon: Carole Civil, MD;  Location: [facility];  Service: Orthopedics;  Laterality: Left;  Marland Kitchen VIDEO BRONCHOSCOPY WITH ENDOBRONCHIAL ULTRASOUND N/A 05/04/2017   Procedure: VIDEO BRONCHOSCOPY WITH ENDOBRONCHIAL ULTRASOUND;  Surgeon: Melrose Nakayama, MD;  Location: [facility];  Service: Thoracic;  Laterality: N/A;  . WISDOM TOOTH EXTRACTION       SOCIAL  HISTORY:  Social History   Socioeconomic History  . Marital status: Married    Spouse name: Not on file  . Number of children: Not on file  . Years of education: Not on file  . Highest education level: Not on file  Occupational History  . Occupation: retired    Fish farm manager: [employer]  Social Needs  . Financial resource strain: Not on file  . Food insecurity:    Worry: Not on file    Inability: Not on file  . Transportation needs:    Medical: Not on file    Non-medical: Not on file  Tobacco Use  . Smoking status: Current Every Day Smoker    Packs/day: 0.50    Years: 62.00    Pack years: 31.00    Types: Cigarettes  . Smokeless tobacco: Never Used  . Tobacco comment: 1/2 ppd > 60 years as of 06/27/17: 7 cigarettes or less a day  Substance and Sexual Activity  . Alcohol use: Not Currently    Comment: history of ETOH abuse,  stopped 1.5 years ago (had one drink at [place] [employer]).   . Drug use: No  . Sexual activity: Yes    Birth control/protection: None  Lifestyle  . Physical activity:    Days per week: Not on file    Minutes per session: Not on file  . Stress: Not on file  Relationships  . Social connections:    Talks on phone: Not on file    Gets together: Not on file    Attends religious service: Not on file    Active member of club or organization: Not on file    Attends meetings of clubs or organizations: Not on file    Relationship status: Not on file  . Intimate partner violence:    Fear of current or ex partner: Not on file    Emotionally abused: Not on file    Physically abused: Not on file    Forced sexual activity: Not on file  Other Topics Concern  . Not on file  Social History Narrative  . Not on file    FAMILY HISTORY:  Family History  Problem Relation Age of Onset  . Stroke Mother 42  . Heart disease Father 87       MI  . Diabetes Maternal Uncle   . Colon cancer Neg Hx   . Cancer Neg Hx     CURRENT MEDICATIONS:  Outpatient  Encounter Medications as of 07/23/2018  Medication Sig  . cholecalciferol (VITAMIN D) 1000 units tablet Take 1,000 Units by mouth daily.  [place] Oris (IMFINZI IV) Inject into the vein.  . fluticasone (FLONASE) 50 MCG/ACT nasal spray PLACE 2 SPRAYS IEN QD  . levothyroxine (SYNTHROID, LEVOTHROID) 100 MCG tablet Take 1 tablet (100 mcg total) by mouth daily before breakfast.  . meclizine (ANTIVERT) 25 MG tablet TK 1 T PO  Q 6 H PRN  . Nutritional Supplements (ENSURE ENLIVE PO) Take 237 mLs by mouth daily.   Marland Kitchen PROAIR HFA 108 (90 Base) MCG/ACT inhaler Inhale 2 puffs into the lungs every 6 (six) hours as needed (for shortness of breath/wheezing/cough).   . Propylene Glycol-Glycerin (SOOTHE) 0.6-0.6 % SOLN Place 1-2 drops into both eyes 3 (three) times daily as needed (for dry/irritated eyes.).  Marland Kitchen SPIRIVA HANDIHALER 18 MCG inhalation capsule INHALE CONTENTS OF 1 CAPSULE VIA HANDHALER QD   No facility-administered encounter medications on file as of 07/23/2018.     ALLERGIES:  No Known Allergies   PHYSICAL EXAM:  ECOG Performance status: 1  Vitals:   07/23/18 0942  BP: (!) 152/76  Pulse: 86  Resp: (!) 21  Temp: 98 F (36.7 C)  SpO2: 93%   Filed Weights   07/23/18 0942  Weight: 174 lb 3.2 oz (79 kg)    Physical Exam Vitals signs reviewed.  Constitutional:      Appearance: Normal appearance.  Cardiovascular:     Rate and Rhythm: Normal rate and regular rhythm.     Heart sounds: Normal heart sounds.  Pulmonary:     Effort: Pulmonary effort is normal.     Breath sounds: Normal breath sounds.  Abdominal:     General: There is no distension.     Palpations: Abdomen is soft. There is no mass.  Skin:    General: Skin is warm.  Neurological:     General: No focal deficit present.     Mental Status: He is alert and oriented to person, place, and time.  Psychiatric:        Mood and  Affect: Mood normal.        Behavior: Behavior normal.      LABORATORY DATA:  I have reviewed  the labs as listed.  CBC    Component Value Date/Time   WBC 5.7 07/19/2018 1038   RBC 4.60 07/19/2018 1038   HGB 14.1 07/19/2018 1038   HCT 43.9 07/19/2018 1038   PLT 178 07/19/2018 1038   MCV 95.4 07/19/2018 1038   MCH 30.7 07/19/2018 1038   MCHC 32.1 07/19/2018 1038   RDW 16.4 (H) 07/19/2018 1038   LYMPHSABS 0.8 07/19/2018 1038   MONOABS 0.5 07/19/2018 1038   EOSABS 0.4 07/19/2018 1038   BASOSABS 0.0 07/19/2018 1038   CMP Latest Ref Rng & Units 07/19/2018 07/08/2018 06/20/2018  Glucose 70 - 99 mg/dL 118(H) 79 86  BUN 8 - 23 mg/dL _0 Creatinine 0.61 - 1.24 mg/dL 0.94 0.78 0.91  Sodium 135 - 145 mmol/L 140 139 139  Potassium 3.5 - 5.1 mmol/L 3.9 4.1 3.8  Chloride 98 - 111 mmol/L 100 101 101  CO2 22 - 32 mmol/L _1 Calcium 8.9 - 10.3 mg/dL 8.8(L) 9.1 9.1  Total Protein 6.5 - 8.1 g/dL 7.0 7.3 7.9  Total Bilirubin 0.3 - 1.2 mg/dL 0.9 0.7 0.5  Alkaline Phos 38 - 126 U/L 82 84 87  AST 15 - 41 U/L _2 ALT 0 - 44 U/L _3 DIAGNOSTIC IMAGING:  I have independently reviewed the scans and discussed with the patient.    ASSESSMENT & PLAN:   Malignant neoplasm of bronchus and lung ([facility]) 1.  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 -Chemoradiation therapy with carboplatin and paclitaxel weekly from 06/11/2017 through 07/16/2017. -CT scan of the chest dated 09/07/2017 showed 10 x 7 mm right upper lobe nodule which has not substantially changed in the interval (previously measured 10 x 8 mm).  Right paratracheal adenopathy is no longer visible.  He got partial response and was recommended immunotherapy with Durvalumab every 2 weeks for 26 treatments based on excellent results from [facility] trial. -Durvalumab consolidation  started on 09/20/2017. -MRI of the brain on 04/29/2018 done for dizziness did not show any metastatic disease. -CT CAP on 06/20/2018 shows similar size of spiculated right apical pulmonary nodule with increased surrounding radiation change.  No major changes of metastatic disease.   - He reported worsening shortness of breath in the last few weeks.  This is mainly on exertion.  He also had episodes where he had to wake up in the middle of the night feeling short of breath.  Never had any history of heart problems. -I have recommended doing a 2D echocardiogram. -His oxygen saturations are 93% on room air.  He is going to stop by Dr. Delanna Ahmadi office today to have saturations checked on exertion to see if he qualifies for oxygen. -If there is no clear reason, will consider repeating CT of the chest to rule out pneumonitis.  2.  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  No medication is needed.   3.  Hypothyroidism: -Last TSH was 1.35 on 05/24/2018.  He will continue Synthroid 100 mcg daily.   Total time spent is 25 minutes with  more than 50% of the time spent face-to-face discussing scan results, treatment recommendations and coordination of care.    Orders placed this encounter:  Orders Placed This Encounter  Procedures  . ECHOCARDIOGRAM COMPLETE      Derek Jack, [place] [phone]

## 2018-07-23 NOTE — Progress Notes (Signed)
Treatment given today per MD orders. Tolerated infusion without adverse affects. Vital signs stable. No complaints at this time. Discharged from clinic ambulatory. F/U with [facility] as scheduled.   

## 2018-08-05 ENCOUNTER — Inpatient Hospital Stay (HOSPITAL_COMMUNITY): Payer: PPO | Attending: Hematology

## 2018-08-05 ENCOUNTER — Other Ambulatory Visit: Payer: Self-pay

## 2018-08-05 DIAGNOSIS — Z9221 Personal history of antineoplastic chemotherapy: Secondary | ICD-10-CM | POA: Diagnosis not present

## 2018-08-05 DIAGNOSIS — C3411 Malignant neoplasm of upper lobe, right bronchus or lung: Secondary | ICD-10-CM | POA: Diagnosis not present

## 2018-08-05 DIAGNOSIS — Z5112 Encounter for antineoplastic immunotherapy: Secondary | ICD-10-CM | POA: Diagnosis not present

## 2018-08-05 DIAGNOSIS — Z923 Personal history of irradiation: Secondary | ICD-10-CM | POA: Diagnosis not present

## 2018-08-05 DIAGNOSIS — T451X5A Adverse effect of antineoplastic and immunosuppressive drugs, initial encounter: Secondary | ICD-10-CM | POA: Insufficient documentation

## 2018-08-05 DIAGNOSIS — E039 Hypothyroidism, unspecified: Secondary | ICD-10-CM | POA: Diagnosis not present

## 2018-08-05 DIAGNOSIS — G620 Drug-induced polyneuropathy: Secondary | ICD-10-CM | POA: Diagnosis not present

## 2018-08-05 DIAGNOSIS — Z79899 Other long term (current) drug therapy: Secondary | ICD-10-CM | POA: Diagnosis not present

## 2018-08-05 DIAGNOSIS — C3490 Malignant neoplasm of unspecified part of unspecified bronchus or lung: Secondary | ICD-10-CM

## 2018-08-05 LAB — CBC WITH DIFFERENTIAL/PLATELET
Abs Immature Granulocytes: 0.02 10*3/uL (ref 0.00–0.07)
Basophils Absolute: 0 10*3/uL (ref 0.0–0.1)
Basophils Relative: 0 %
Eosinophils Absolute: 0.4 10*3/uL (ref 0.0–0.5)
Eosinophils Relative: 9 %
HCT: 47 % (ref 39.0–52.0)
Hemoglobin: 14.9 g/dL (ref 13.0–17.0)
Immature Granulocytes: 0 %
Lymphocytes Relative: 15 %
Lymphs Abs: 0.8 10*3/uL (ref 0.7–4.0)
MCH: 30.6 pg (ref 26.0–34.0)
MCHC: 31.7 g/dL (ref 30.0–36.0)
MCV: 96.5 fL (ref 80.0–100.0)
Monocytes Absolute: 0.4 10*3/uL (ref 0.1–1.0)
Monocytes Relative: 8 %
Neutro Abs: 3.5 10*3/uL (ref 1.7–7.7)
Neutrophils Relative %: 68 %
Platelets: 154 10*3/uL (ref 150–400)
RBC: 4.87 MIL/uL (ref 4.22–5.81)
RDW: 16 % — ABNORMAL HIGH (ref 11.5–15.5)
WBC: 5.1 10*3/uL (ref 4.0–10.5)
nRBC: 0 % (ref 0.0–0.2)

## 2018-08-05 LAB — COMPREHENSIVE METABOLIC PANEL
ALT: 15 U/L (ref 0–44)
AST: 21 U/L (ref 15–41)
Albumin: 4 g/dL (ref 3.5–5.0)
Alkaline Phosphatase: 74 U/L (ref 38–126)
Anion gap: 10 (ref 5–15)
BUN: 18 mg/dL (ref 8–23)
CO2: 30 mmol/L (ref 22–32)
Calcium: 9 mg/dL (ref 8.9–10.3)
Chloride: 100 mmol/L (ref 98–111)
Creatinine, Ser: 0.85 mg/dL (ref 0.61–1.24)
GFR calc Af Amer: >60 mL/min (ref >60)
GFR calc non Af Amer: >60 mL/min (ref >60)
Glucose, Bld: 90 mg/dL (ref 70–99)
Potassium: 4.1 mmol/L (ref 3.5–5.1)
Sodium: 140 mmol/L (ref 135–145)
Total Bilirubin: 1.1 mg/dL (ref 0.3–1.2)
Total Protein: 7.2 g/dL (ref 6.5–8.1)

## 2018-08-06 ENCOUNTER — Inpatient Hospital Stay (HOSPITAL_COMMUNITY): Payer: PPO

## 2018-08-06 ENCOUNTER — Encounter (HOSPITAL_COMMUNITY): Payer: Self-pay

## 2018-08-06 ENCOUNTER — Other Ambulatory Visit: Payer: Self-pay

## 2018-08-06 VITALS — BP 182/67 | HR 62 | Temp 98.0°F | Resp 19 | Wt 174.4 lb

## 2018-08-06 DIAGNOSIS — Z5112 Encounter for antineoplastic immunotherapy: Secondary | ICD-10-CM | POA: Diagnosis not present

## 2018-08-06 DIAGNOSIS — C3490 Malignant neoplasm of unspecified part of unspecified bronchus or lung: Secondary | ICD-10-CM

## 2018-08-06 MED ORDER — SODIUM CHLORIDE 0.9 % IV SOLN
Freq: Once | INTRAVENOUS | Status: AC
  Administered 2018-08-06: 14:00:00 via INTRAVENOUS

## 2018-08-06 MED ORDER — HEPARIN SOD (PORK) LOCK FLUSH 100 UNIT/ML IV SOLN
500.0000 [IU] | Freq: Once | INTRAVENOUS | Status: AC | PRN
  Administered 2018-08-06: 15:00:00 500 [IU]

## 2018-08-06 MED ORDER — SODIUM CHLORIDE 0.9 % IV SOLN
10.6000 mg/kg | Freq: Once | INTRAVENOUS | Status: AC
  Administered 2018-08-06: 860 mg via INTRAVENOUS
  Filled 2018-08-06: qty 7.2

## 2018-08-06 MED ORDER — CLONIDINE HCL 0.1 MG PO TABS
0.1000 mg | ORAL_TABLET | Freq: Once | ORAL | Status: AC
  Administered 2018-08-06: 0.1 mg via ORAL
  Filled 2018-08-06: qty 1

## 2018-08-06 MED ORDER — SODIUM CHLORIDE 0.9% FLUSH
10.0000 mL | INTRAVENOUS | Status: DC | PRN
  Administered 2018-08-06: 10 mL
  Filled 2018-08-06: qty 10

## 2018-08-06 NOTE — Progress Notes (Signed)
Pt presents today for treatment. Labs drawn on 08/05/18. Pt has no complaints of any pain or changes since the last visit. Labs within parameters for treatment. Pt states, " I want to see about getting oxygen at home." Pt teaching performed.   Reported BP to Dr, Delton Coombes of 182/63. WO received to give 0.1mg  of Clonidine PO x 1 dose now.   Treatment given today per MD orders. Tolerated infusion without adverse affects. Vital signs stable. No complaints at this time. Pt teaching performed pertaining to h/a, blurred vision, seeing spots. Pt instructed to take blood pressure medicine prescribed by his primary physician . Understanding verbalized. Discharged from clinic ambulatory. F/U with [facility] as scheduled.

## 2018-08-06 NOTE — Progress Notes (Signed)
08/05/08  Received order to give patient clonidine 0.1 mg oral x 1, order entered into Epic as such.  V.O. Dr Maye Hides RN/Josalynn Johndrow Ronnald Ramp, PharmD

## 2018-08-06 NOTE — Patient Instructions (Signed)
[facility] Discharge Instructions for Patients Receiving Chemotherapy  Today you received the following chemotherapy agents   To help prevent nausea and vomiting after your treatment, we encourage you to take your nausea medication   If you develop nausea and vomiting that is not controlled by your nausea medication, call the clinic.   BELOW ARE SYMPTOMS THAT SHOULD BE REPORTED IMMEDIATELY:  *FEVER GREATER THAN 100.5 F  *CHILLS WITH OR WITHOUT FEVER  NAUSEA AND VOMITING THAT IS NOT CONTROLLED WITH YOUR NAUSEA MEDICATION  *UNUSUAL SHORTNESS OF BREATH  *UNUSUAL BRUISING OR BLEEDING  TENDERNESS IN MOUTH AND THROAT WITH OR WITHOUT PRESENCE OF ULCERS  *URINARY PROBLEMS  *BOWEL PROBLEMS  UNUSUAL RASH Items with * indicate a potential emergency and should be followed up as soon as possible.  Feel free to call the clinic should you have any questions or concerns. The clinic phone number is [phone].  Please show the CHEMO ALERT CARD at check-in to the Emergency Department and triage nurse.   

## 2018-08-16 ENCOUNTER — Other Ambulatory Visit: Payer: Self-pay

## 2018-08-16 ENCOUNTER — Inpatient Hospital Stay (HOSPITAL_COMMUNITY): Payer: PPO

## 2018-08-16 ENCOUNTER — Ambulatory Visit (HOSPITAL_COMMUNITY)
Admission: RE | Admit: 2018-08-16 | Discharge: 2018-08-16 | Disposition: A | Discharge status: Home / Self Care | Payer: PPO | Source: Ambulatory Visit | Attending: Hematology | Admitting: Hematology

## 2018-08-16 DIAGNOSIS — I083 Combined rheumatic disorders of mitral, aortic and tricuspid valves: Secondary | ICD-10-CM | POA: Diagnosis not present

## 2018-08-16 DIAGNOSIS — F17200 Nicotine dependence, unspecified, uncomplicated: Secondary | ICD-10-CM | POA: Diagnosis not present

## 2018-08-16 DIAGNOSIS — C3490 Malignant neoplasm of unspecified part of unspecified bronchus or lung: Secondary | ICD-10-CM

## 2018-08-16 DIAGNOSIS — Z5112 Encounter for antineoplastic immunotherapy: Secondary | ICD-10-CM | POA: Diagnosis not present

## 2018-08-16 DIAGNOSIS — C3491 Malignant neoplasm of unspecified part of right bronchus or lung: Secondary | ICD-10-CM | POA: Insufficient documentation

## 2018-08-16 DIAGNOSIS — J449 Chronic obstructive pulmonary disease, unspecified: Secondary | ICD-10-CM | POA: Insufficient documentation

## 2018-08-16 LAB — COMPREHENSIVE METABOLIC PANEL
ALT: 15 U/L (ref 0–44)
AST: 21 U/L (ref 15–41)
Albumin: 4 g/dL (ref 3.5–5.0)
Alkaline Phosphatase: 82 U/L (ref 38–126)
Anion gap: 10 (ref 5–15)
BUN: 16 mg/dL (ref 8–23)
CO2: 31 mmol/L (ref 22–32)
Calcium: 9 mg/dL (ref 8.9–10.3)
Chloride: 99 mmol/L (ref 98–111)
Creatinine, Ser: 0.89 mg/dL (ref 0.61–1.24)
GFR calc Af Amer: >60 mL/min (ref >60)
GFR calc non Af Amer: >60 mL/min (ref >60)
Glucose, Bld: 119 mg/dL — ABNORMAL HIGH (ref 70–99)
Potassium: 3.7 mmol/L (ref 3.5–5.1)
Sodium: 140 mmol/L (ref 135–145)
Total Bilirubin: 0.9 mg/dL (ref 0.3–1.2)
Total Protein: 7.1 g/dL (ref 6.5–8.1)

## 2018-08-16 LAB — CBC WITH DIFFERENTIAL/PLATELET
Abs Immature Granulocytes: 0.01 10*3/uL (ref 0.00–0.07)
Basophils Absolute: 0 10*3/uL (ref 0.0–0.1)
Basophils Relative: 1 %
Eosinophils Absolute: 0.2 10*3/uL (ref 0.0–0.5)
Eosinophils Relative: 5 %
HCT: 46.3 % (ref 39.0–52.0)
Hemoglobin: 14.9 g/dL (ref 13.0–17.0)
Immature Granulocytes: 0 %
Lymphocytes Relative: 19 %
Lymphs Abs: 0.9 10*3/uL (ref 0.7–4.0)
MCH: 31.1 pg (ref 26.0–34.0)
MCHC: 32.2 g/dL (ref 30.0–36.0)
MCV: 96.7 fL (ref 80.0–100.0)
Monocytes Absolute: 0.4 10*3/uL (ref 0.1–1.0)
Monocytes Relative: 9 %
Neutro Abs: 3.1 10*3/uL (ref 1.7–7.7)
Neutrophils Relative %: 66 %
Platelets: 157 10*3/uL (ref 150–400)
RBC: 4.79 MIL/uL (ref 4.22–5.81)
RDW: 15.5 % (ref 11.5–15.5)
WBC: 4.6 10*3/uL (ref 4.0–10.5)
nRBC: 0 % (ref 0.0–0.2)

## 2018-08-16 NOTE — Progress Notes (Signed)
*  PRELIMINARY RESULTS* Echocardiogram 2D Echocardiogram has been performed.  Ian Aguilar 08/16/2018, 10:12 AM

## 2018-08-19 ENCOUNTER — Ambulatory Visit (HOSPITAL_COMMUNITY): Payer: PPO | Admitting: Hematology

## 2018-08-19 ENCOUNTER — Other Ambulatory Visit (HOSPITAL_COMMUNITY): Payer: PPO

## 2018-08-19 DIAGNOSIS — R0902 Hypoxemia: Secondary | ICD-10-CM | POA: Diagnosis not present

## 2018-08-19 DIAGNOSIS — Z1389 Encounter for screening for other disorder: Secondary | ICD-10-CM | POA: Diagnosis not present

## 2018-08-19 DIAGNOSIS — J449 Chronic obstructive pulmonary disease, unspecified: Secondary | ICD-10-CM | POA: Diagnosis not present

## 2018-08-19 DIAGNOSIS — J439 Emphysema, unspecified: Secondary | ICD-10-CM | POA: Diagnosis not present

## 2018-08-19 DIAGNOSIS — Z719 Counseling, unspecified: Secondary | ICD-10-CM | POA: Diagnosis not present

## 2018-08-19 DIAGNOSIS — F17200 Nicotine dependence, unspecified, uncomplicated: Secondary | ICD-10-CM | POA: Diagnosis not present

## 2018-08-19 DIAGNOSIS — Z6824 Body mass index (BMI) 24.0-24.9, adult: Secondary | ICD-10-CM | POA: Diagnosis not present

## 2018-08-20 ENCOUNTER — Other Ambulatory Visit: Payer: Self-pay

## 2018-08-20 ENCOUNTER — Ambulatory Visit (HOSPITAL_COMMUNITY): Payer: PPO

## 2018-08-20 ENCOUNTER — Inpatient Hospital Stay (HOSPITAL_COMMUNITY): Payer: PPO

## 2018-08-20 ENCOUNTER — Encounter (HOSPITAL_COMMUNITY): Payer: Self-pay | Admitting: Hematology

## 2018-08-20 ENCOUNTER — Inpatient Hospital Stay (HOSPITAL_BASED_OUTPATIENT_CLINIC_OR_DEPARTMENT_OTHER): Payer: PPO | Admitting: Hematology

## 2018-08-20 VITALS — BP 160/67 | HR 81 | Temp 98.0°F | Resp 16 | Wt 171.6 lb

## 2018-08-20 VITALS — BP 148/68 | HR 52 | Temp 97.9°F | Resp 18

## 2018-08-20 DIAGNOSIS — Z5112 Encounter for antineoplastic immunotherapy: Secondary | ICD-10-CM | POA: Diagnosis not present

## 2018-08-20 DIAGNOSIS — C3491 Malignant neoplasm of unspecified part of right bronchus or lung: Secondary | ICD-10-CM

## 2018-08-20 DIAGNOSIS — C3411 Malignant neoplasm of upper lobe, right bronchus or lung: Secondary | ICD-10-CM

## 2018-08-20 DIAGNOSIS — C3490 Malignant neoplasm of unspecified part of unspecified bronchus or lung: Secondary | ICD-10-CM

## 2018-08-20 MED ORDER — SODIUM CHLORIDE 0.9 % IV SOLN
Freq: Once | INTRAVENOUS | Status: AC
  Administered 2018-08-20: 12:00:00 via INTRAVENOUS

## 2018-08-20 MED ORDER — SODIUM CHLORIDE 0.9 % IV SOLN
10.6000 mg/kg | Freq: Once | INTRAVENOUS | Status: AC
  Administered 2018-08-20: 860 mg via INTRAVENOUS
  Filled 2018-08-20: qty 7.2

## 2018-08-20 MED ORDER — HEPARIN SOD (PORK) LOCK FLUSH 100 UNIT/ML IV SOLN
500.0000 [IU] | Freq: Once | INTRAVENOUS | Status: AC | PRN
  Administered 2018-08-20: 500 [IU]

## 2018-08-20 MED ORDER — SODIUM CHLORIDE 0.9% FLUSH
10.0000 mL | INTRAVENOUS | Status: DC | PRN
  Administered 2018-08-20: 10 mL
  Filled 2018-08-20: qty 10

## 2018-08-20 NOTE — Progress Notes (Signed)
[place] for treatment today per MD. Labs done yesterday and reviewed with MD today at office visit.   Treatment given per orders. Patient tolerated it well without problems. Vitals stable and discharged home from clinic ambulatory. Follow up as scheduled.

## 2018-08-20 NOTE — Progress Notes (Signed)
[place], [place] [address]   CLINIC:  Medical Oncology/Hematology  PCP:  Sharilyn Sites, MD [address] [place] [address] [phone]   REASON FOR VISIT:  Follow-up for Lung adenocarcinoma   CURRENT THERAPY: Imfinzi  BRIEF ONCOLOGIC HISTORY:  Oncology History  Carcinoma, lung ([place])  06/05/2017 Initial Diagnosis   Carcinoma, lung ([place])   06/05/2017 - 07/18/2017 Chemotherapy   The patient had palonosetron (ALOXI) injection 0.25 mg, 0.25 mg, Intravenous,  Once, 6 of 6 cycles Administration: 0.25 mg (06/11/2017), 0.25 mg (07/09/2017), 0.25 mg (07/16/2017), 0.25 mg (06/18/2017), 0.25 mg (06/25/2017), 0.25 mg (07/02/2017) CARBOplatin (PARAPLATIN) 190 mg in sodium chloride 0.9 % 250 mL chemo infusion, 190 mg (100 % of original dose 192.8 mg), Intravenous,  Once, 6 of 6 cycles Dose modification:   (original dose 192.8 mg, Cycle 1),   (original dose 192.8 mg, Cycle 5), 144.6 mg (original dose 144.6 mg, Cycle 6),   (original dose 192.8 mg, Cycle 2),   (original dose 192.8 mg, Cycle 3),   (original dose 192.8 mg, Cycle 4) Administration: 190 mg (06/11/2017), 190 mg (07/09/2017), 140 mg (07/16/2017), 190 mg (06/18/2017), 190 mg (06/25/2017), 190 mg (07/02/2017) PACLitaxel (TAXOL) 90 mg in dextrose 5 % 250 mL chemo infusion (</= 23m/m2), 45 mg/m2 = 90 mg, Intravenous,  Once, 6 of 6 cycles Dose modification: 40.5 mg/m2 (90 % of original dose 45 mg/m2, Cycle 5, Reason: Other (see comments), Comment: early neuropathy), 36 mg/m2 (80 % of original dose 45 mg/m2, Cycle 6, Reason: Provider Judgment) Administration: 90 mg (06/11/2017), 78 mg (07/09/2017), 72 mg (07/16/2017), 90 mg (06/18/2017), 78 mg (06/25/2017), 78 mg (07/02/2017)  for chemotherapy treatment.    [place] lung [place]  06/08/2017 Initial Diagnosis   Malignant [place] of [place]us and lung ([place]   09/20/2017 -  Chemotherapy   The patient had durvalumab (IMFINZI) 860 mg in sodium  chloride 0.9 % 100 mL chemo infusion, 800 mg, Intravenous,  Once, 23 of 26 cycles Administration: 860 mg (09/20/2017), 860 mg (10/04/2017), 860 mg (10/18/2017), 860 mg (11/01/2017), 860 mg (11/15/2017), 860 mg (11/29/2017), 860 mg (12/13/2017), 860 mg (12/27/2017), 860 mg (01/10/2018), 860 mg (01/28/2018), 860 mg (02/11/2018), 860 mg (02/25/2018), 860 mg (03/11/2018), 860 mg (03/25/2018), 860 mg (04/08/2018), 860 mg (04/22/2018), 860 mg (05/08/2018), 860 mg (05/27/2018), 860 mg (06/10/2018), 860 mg (06/25/2018), 860 mg (07/09/2018), 860 mg (07/23/2018), 860 mg (08/06/2018)  for chemotherapy treatment.        INTERVAL HISTORY:  Mr. [age]herwin89y.o. male presents today for follow up.  He is continuing to have shortness of breath on exertion.  Overall this has improved slightly from last visit.  Denies any chest pains.  He was evaluated by Dr. GHilma Favorsand is in the process of getting oxygen.  He denies any cough or expectoration.  No skin rashes.  No diarrhea.  No nausea vomiting or constipation.  Denies any fevers or infections.  Denies any ER visits or hospitalizations.  Appetite is 75%.  Energy levels are 75%.    REVIEW OF SYSTEMS:  Review of Systems  Constitutional: Negative.   HENT:  Negative.   Eyes: Negative.   Respiratory: Positive for shortness of breath.   Cardiovascular: Negative.   Gastrointestinal: Negative.   Endocrine: Negative.   Genitourinary: Negative.    Musculoskeletal: Negative.   Skin: Negative.   Neurological: Negative.   Hematological: Negative.   Psychiatric/Behavioral: Negative.      PAST MEDICAL/SURGICAL HISTORY:  Past Medical History:  Diagnosis Date  . Anxiety   . Arthritis   . Cirrhosis ([facility] [facility])    confirmed by MRI on 07/04/11.    Marland Kitchen COPD (chronic obstructive pulmonary disease) ([place])   . Depression with anxiety   . ETOH abuse   . Hyperlipidemia   . Nodule of upper lobe of right lung    with mediastinal adenopathy  . Wears dentures   . Wears glasses    Past Surgical  History:  Procedure Laterality Date  . bilateral cataract surgery     [place]  . broken arm  6 yrs ago   left otif of wrist-[place]  . CLOSED REDUCTION WRIST FRACTURE Right 09/02/2015   Procedure: RIGHT WRIST REDUCTION;  Surgeon: Renette Butters, MD;  Location: [facility];  Service: Orthopedics;  Laterality: Right;  with MAC  . COLONOSCOPY  1996   [facility]: external hemorrhoids, no polyps  . COLONOSCOPY  06/28/2011   Dr. Ala Bent diverticulosis,tubular adenoma, hyperplastic polyp  . COLONOSCOPY WITH PROPOFOL N/A 04/26/2017   Dr. Gala Romney: perianal and digital rectal examinations normal, diffusely congested colonic mucosa but otherwise normal  . ESOPHAGOGASTRODUODENOSCOPY  06/28/2011   Dr. Chelsea Aus erosive reflux esophagitis, hiatal hernia-gastritis  . ESOPHAGOGASTRODUODENOSCOPY (EGD) WITH PROPOFOL N/A 04/26/2017   Dr. Gala Romney: normal esophagus, small hiatal hernia, GAVE, portal hypertensive gastropathy, normal duodenal bulb and second portion, no specimens collected. 2 years screening   . EXTERNAL EAR SURGERY     right ear-cleaned out ear and created new eardrum  . INGUINAL HERNIA REPAIR  2-3 yrs ago   left-[facility]_  . INTRAMEDULLARY (IM) NAIL INTERTROCHANTERIC Right 09/02/2015   Procedure: RIGHT  INTERTROCHANTRIC HIP;  Surgeon: Renette Butters, MD;  Location: [facility];  Service: Orthopedics;  Laterality: Right;  With MAC  . KNEE SURGERY     left-arthroscopy-[place]  . PORTACATH PLACEMENT Left 06/08/2017   Procedure: INSERTION POWER PORT WITH  ATTACHED CATHETER LEFT SUBCLAVIAN;  Surgeon: Aviva Signs, MD;  Location: [facility];  Service: General;  Laterality: Left;  . SKULL FRACTURE ELEVATION     fractured cheeck bone repaired  . TOTAL HIP ARTHROPLASTY  12/18/2011   Procedure: TOTAL HIP ARTHROPLASTY;  Surgeon: Carole Civil, MD;  Location: [facility];  Service: Orthopedics;  Laterality: Left;  Marland Kitchen VIDEO BRONCHOSCOPY WITH ENDOBRONCHIAL ULTRASOUND N/A 05/04/2017   Procedure: VIDEO BRONCHOSCOPY WITH  ENDOBRONCHIAL ULTRASOUND;  Surgeon: Melrose Nakayama, MD;  Location: [facility];  Service: Thoracic;  Laterality: N/A;  . WISDOM TOOTH EXTRACTION       SOCIAL HISTORY:  Social History   Socioeconomic History  . Marital status: Married    Spouse name: Not on file  . Number of children: Not on file  . Years of education: Not on file  . Highest education level: Not on file  Occupational History  . Occupation: retired    Fish farm manager: [employer]  Social Needs  . Financial resource strain: Not on file  . Food insecurity    Worry: Not on file    Inability: Not on file  . Transportation needs    Medical: Not on file    Non-medical: Not on file  Tobacco Use  . Smoking status: Current Every Day Smoker    Packs/day: 0.50    Years: 62.00    Pack years: 31.00    Types: Cigarettes  . Smokeless tobacco: Never Used  . Tobacco comment: 1/2 ppd > 60 years as of 06/27/17: 7 cigarettes or less a day  Substance and  Sexual Activity  . Alcohol use: Not Currently    Comment: history of ETOH abuse, stopped 1.5 years ago (had one drink at [place] [employer]).   . Drug use: No  . Sexual activity: Yes    Birth control/protection: None  Lifestyle  . Physical activity    Days per week: Not on file    Minutes per session: Not on file  . Stress: Not on file  Relationships  . Social Herbalist on phone: Not on file    Gets together: Not on file    Attends religious service: Not on file    Active member of club or organization: Not on file    Attends meetings of clubs or organizations: Not on file    Relationship status: Not on file  . Intimate partner violence    Fear of current or ex partner: Not on file    Emotionally abused: Not on file    Physically abused: Not on file    Forced sexual activity: Not on file  Other Topics Concern  . Not on file  Social History Narrative  . Not on file    FAMILY HISTORY:  Family History  Problem Relation Age of Onset  . Stroke Mother  87  . Heart disease Father 42       MI  . Diabetes Maternal Uncle   . Colon cancer Neg Hx   . Cancer Neg Hx     CURRENT MEDICATIONS:  Outpatient Encounter Medications as of 08/20/2018  Medication Sig  . budesonide-formoterol (SYMBICORT) 160-4.5 MCG/ACT inhaler Inhale 2 puffs into the lungs 2 (two) times daily.  . cholecalciferol (VITAMIN D) 1000 units tablet Take 1,000 Units by mouth daily.  [place] Oris (IMFINZI IV) Inject into the vein.  . fluticasone (FLONASE) 50 MCG/ACT nasal spray PLACE 2 SPRAYS IEN QD  . levothyroxine (SYNTHROID, LEVOTHROID) 100 MCG tablet Take 1 tablet (100 mcg total) by mouth daily before breakfast.  . meclizine (ANTIVERT) 25 MG tablet TK 1 T PO  Q 6 H PRN  . Nutritional Supplements (ENSURE ENLIVE PO) Take 237 mLs by mouth daily.   Marland Kitchen PROAIR HFA 108 (90 Base) MCG/ACT inhaler Inhale 2 puffs into the lungs every 6 (six) hours as needed (for shortness of breath/wheezing/cough).   . Propylene Glycol-Glycerin (SOOTHE) 0.6-0.6 % SOLN Place 1-2 drops into both eyes 3 (three) times daily as needed (for dry/irritated eyes.).  Marland Kitchen SPIRIVA HANDIHALER 18 MCG inhalation capsule INHALE CONTENTS OF 1 CAPSULE VIA HANDHALER QD  . XARELTO 15 MG TABS tablet    No facility-administered encounter medications on file as of 08/20/2018.     ALLERGIES:  No Known Allergies   PHYSICAL EXAM:  ECOG Performance status: 1  Vitals:   08/20/18 1127  BP: (!) 160/67  Pulse: 81  Resp: 16  Temp: 98 F (36.7 C)  SpO2: 93%   Filed Weights   08/20/18 1127  Weight: 171 lb 9.6 oz (77.8 kg)    Physical Exam Constitutional:      Appearance: Normal appearance. He is obese.  HENT:     Head: Normocephalic.     Nose: Nose normal.     Mouth/Throat:     Mouth: Mucous membranes are moist.     Pharynx: Oropharynx is clear.  Eyes:     Extraocular Movements: Extraocular movements intact.     Conjunctiva/sclera: Conjunctivae normal.  Neck:     Musculoskeletal: Normal range of motion.   Cardiovascular:     Rate  and Rhythm: Normal rate and regular rhythm.     Pulses: Normal pulses.     Heart sounds: Normal heart sounds.  Pulmonary:     Effort: Pulmonary effort is normal.     Breath sounds: Normal breath sounds.  Abdominal:     General: Bowel sounds are normal.     Palpations: Abdomen is soft.  Musculoskeletal: Normal range of motion.  Skin:    General: Skin is warm and dry.  Neurological:     Mental Status: He is alert and oriented to person, place, and time. Mental status is at baseline.  Psychiatric:        Mood and Affect: Mood normal.        Behavior: Behavior normal.        Thought Content: Thought content normal.        Judgment: Judgment normal.      LABORATORY DATA:  I have reviewed the labs as listed.  CBC    Component Value Date/Time   WBC 4.6 08/16/2018 0855   RBC 4.79 08/16/2018 0855   HGB 14.9 08/16/2018 0855   HCT 46.3 08/16/2018 0855   PLT 157 08/16/2018 0855   MCV 96.7 08/16/2018 0855   MCH 31.1 08/16/2018 0855   MCHC 32.2 08/16/2018 0855   RDW 15.5 08/16/2018 0855   LYMPHSABS 0.9 08/16/2018 0855   MONOABS 0.4 08/16/2018 0855   EOSABS 0.2 08/16/2018 0855   BASOSABS 0.0 08/16/2018 0855   CMP Latest Ref Rng & Units 08/16/2018 08/05/2018 07/19/2018  Glucose 70 - 99 mg/dL 119(H) 90 118(H)  BUN 8 - 23 mg/dL _0 Creatinine 0.61 - 1.24 mg/dL 0.89 0.85 0.94  Sodium 135 - 145 mmol/L 140 140 140  Potassium 3.5 - 5.1 mmol/L 3.7 4.1 3.9  Chloride 98 - 111 mmol/L 99 100 100  CO2 22 - 32 mmol/L _1 Calcium 8.9 - 10.3 mg/dL 9.0 9.0 8.8(L)  Total Protein 6.5 - 8.1 g/dL 7.1 7.2 7.0  Total Bilirubin 0.3 - 1.2 mg/dL 0.9 1.1 0.9  Alkaline Phos 38 - 126 U/L 82 74 82  AST 15 - 41 U/L _2 ALT 0 - 44 U/L _3 DIAGNOSTIC IMAGING:  I have independently reviewed the scans and discussed with the patient.   I have reviewed Carver Fila, NP's note and agree with the documentation.  I personally performed a face-to-face visit,  made revisions and my assessment and plan is as follows.    ASSESSMENT & PLAN:   Carcinoma, lung ([place]) 1.  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 -Chemoradiation therapy with carboplatin and paclitaxel weekly from 06/11/2017 through 07/16/2017. -CT scan of the chest dated 09/07/2017 showed 10 x 7 mm right upper lobe nodule which has not substantially changed in the interval (previously measured 10 x 8 mm).  Right paratracheal adenopathy is no longer visible.  He got partial response and was recommended immunotherapy with Durvalumab every 2 weeks for 26 treatments based on excellent results from PACIFIC trial. -Durvalumab consolidation started on 09/20/2017. -MRI of the brain on 04/29/2018 done for dizziness did not show any metastatic disease. -CT CAP on 06/20/2018 shows similar size of spiculated right apical pulmonary nodule with increased surrounding radiation change.  No major changes of metastatic disease.   - He reported worsening shortness of breath in the last few weeks.  This is mainly on exertion.  He also had episodes where he had to wake up in the middle of the night feeling short of breath.  Never had any history of heart problems. -ECHO on 08/16/18: EF: 60-65%.  -His oxygen saturations are 93% on room air.  - Recommend CT of the chest to rule out pneumonitis. - We will hold treatment.  - RTC in 2 weeks.   2.  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  No medication is needed.   3.  Hypothyroidism: -Last TSH was 1.35 on 05/24/2018.  He will continue Synthroid 100 mcg daily.   Total time spent is 25 minutes with more than 50% of the time spent face-to-face discussing work-up plan, counseling and coordination of care.     Orders placed this encounter:  Orders Placed This Encounter  Procedures  . CT Chest W Contrast      Derek Jack, MD [place] [phone]

## 2018-08-20 NOTE — Assessment & Plan Note (Addendum)
1.  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 -Chemoradiation therapy with carboplatin and paclitaxel weekly from 06/11/2017 through 07/16/2017. -CT scan of the chest dated 09/07/2017 showed 10 x 7 mm right upper lobe nodule which has not substantially changed in the interval (previously measured 10 x 8 mm).  Right paratracheal adenopathy is no longer visible.  He got partial response and was recommended immunotherapy with Durvalumab every 2 weeks for 26 treatments based on excellent results from [facility]IFIC trial. -Durvalumab consolidation started on 09/20/2017. -MRI of the brain on 04/29/2018 done for dizziness did not show any metastatic disease. -CT CAP on 06/20/2018 shows similar size of spiculated right apical pulmonary nodule with increased surrounding radiation change.  No major changes of metastatic disease.   - He is continuing to have shortness of breath over the last few weeks.  It has improved slightly from last visit.  He was evaluated by Dr. Hilma Favors, and in the process of getting oxygen. -ECHO on 08/16/18: EF: 60-65%.  -His oxygen saturations are 93% on room air.  - Recommend CT of the chest to rule out pneumonitis. -As there is low suspicion of pneumonitis, I will proceed with treatment today. - RTC in 2 weeks.  2.  Neuropathy: -Numbness in the feet from paclitaxel.  No medication is needed.  3.  Hypothyroidism: -Last TSH was 1.35 on 05/24/2018.  He will continue Synthroid 100 mcg daily.

## 2018-08-21 ENCOUNTER — Ambulatory Visit (HOSPITAL_COMMUNITY): Payer: PPO

## 2018-08-21 ENCOUNTER — Ambulatory Visit (HOSPITAL_COMMUNITY): Payer: PPO | Admitting: Hematology

## 2018-08-26 ENCOUNTER — Telehealth: Payer: Self-pay | Admitting: Internal Medicine

## 2018-08-26 NOTE — Telephone Encounter (Signed)
Please NIC 

## 2018-08-26 NOTE — Telephone Encounter (Signed)
[place] in Oct 2020.

## 2018-08-26 NOTE — Telephone Encounter (Signed)
Patient had CT abd/pel 06/21/2018. Please advise AB if patient still needs RUQ in July. thanks

## 2018-08-26 NOTE — Telephone Encounter (Signed)
RECALL FOR ULTRASOUND 

## 2018-08-27 NOTE — Telephone Encounter (Signed)
REMINDER IN EPIC °

## 2018-09-02 ENCOUNTER — Ambulatory Visit (HOSPITAL_COMMUNITY)
Admission: RE | Admit: 2018-09-02 | Discharge: 2018-09-02 | Disposition: A | Discharge status: Home / Self Care | Payer: PPO | Source: Ambulatory Visit | Attending: Hematology | Admitting: Hematology

## 2018-09-02 ENCOUNTER — Other Ambulatory Visit: Payer: Self-pay

## 2018-09-02 ENCOUNTER — Inpatient Hospital Stay (HOSPITAL_COMMUNITY): Payer: PPO | Attending: Hematology

## 2018-09-02 DIAGNOSIS — G620 Drug-induced polyneuropathy: Secondary | ICD-10-CM | POA: Diagnosis not present

## 2018-09-02 DIAGNOSIS — C3411 Malignant neoplasm of upper lobe, right bronchus or lung: Secondary | ICD-10-CM | POA: Insufficient documentation

## 2018-09-02 DIAGNOSIS — J449 Chronic obstructive pulmonary disease, unspecified: Secondary | ICD-10-CM | POA: Diagnosis not present

## 2018-09-02 DIAGNOSIS — F17210 Nicotine dependence, cigarettes, uncomplicated: Secondary | ICD-10-CM | POA: Insufficient documentation

## 2018-09-02 DIAGNOSIS — F1011 Alcohol abuse, in remission: Secondary | ICD-10-CM | POA: Insufficient documentation

## 2018-09-02 DIAGNOSIS — C3490 Malignant neoplasm of unspecified part of unspecified bronchus or lung: Secondary | ICD-10-CM

## 2018-09-02 DIAGNOSIS — Z5112 Encounter for antineoplastic immunotherapy: Secondary | ICD-10-CM | POA: Insufficient documentation

## 2018-09-02 DIAGNOSIS — E039 Hypothyroidism, unspecified: Secondary | ICD-10-CM | POA: Insufficient documentation

## 2018-09-02 DIAGNOSIS — K7460 Unspecified cirrhosis of liver: Secondary | ICD-10-CM | POA: Diagnosis not present

## 2018-09-02 DIAGNOSIS — C3491 Malignant neoplasm of unspecified part of right bronchus or lung: Secondary | ICD-10-CM | POA: Diagnosis not present

## 2018-09-02 DIAGNOSIS — J432 Centrilobular emphysema: Secondary | ICD-10-CM | POA: Diagnosis not present

## 2018-09-02 DIAGNOSIS — Z79899 Other long term (current) drug therapy: Secondary | ICD-10-CM | POA: Diagnosis not present

## 2018-09-02 LAB — COMPREHENSIVE METABOLIC PANEL
ALT: 14 U/L (ref 0–44)
AST: 19 U/L (ref 15–41)
Albumin: 3.9 g/dL (ref 3.5–5.0)
Alkaline Phosphatase: 80 U/L (ref 38–126)
Anion gap: 9 (ref 5–15)
BUN: 21 mg/dL (ref 8–23)
CO2: 29 mmol/L (ref 22–32)
Calcium: 9.2 mg/dL (ref 8.9–10.3)
Chloride: 102 mmol/L (ref 98–111)
Creatinine, Ser: 0.83 mg/dL (ref 0.61–1.24)
GFR calc Af Amer: >60 mL/min (ref >60)
GFR calc non Af Amer: >60 mL/min (ref >60)
Glucose, Bld: 83 mg/dL (ref 70–99)
Potassium: 3.7 mmol/L (ref 3.5–5.1)
Sodium: 140 mmol/L (ref 135–145)
Total Bilirubin: 0.7 mg/dL (ref 0.3–1.2)
Total Protein: 7.2 g/dL (ref 6.5–8.1)

## 2018-09-02 LAB — CBC WITH DIFFERENTIAL/PLATELET
Abs Immature Granulocytes: 0.01 10*3/uL (ref 0.00–0.07)
Basophils Absolute: 0 10*3/uL (ref 0.0–0.1)
Basophils Relative: 0 %
Eosinophils Absolute: 0.1 10*3/uL (ref 0.0–0.5)
Eosinophils Relative: 2 %
HCT: 45.3 % (ref 39.0–52.0)
Hemoglobin: 14.4 g/dL (ref 13.0–17.0)
Immature Granulocytes: 0 %
Lymphocytes Relative: 16 %
Lymphs Abs: 0.8 10*3/uL (ref 0.7–4.0)
MCH: 30.7 pg (ref 26.0–34.0)
MCHC: 31.8 g/dL (ref 30.0–36.0)
MCV: 96.6 fL (ref 80.0–100.0)
Monocytes Absolute: 0.5 10*3/uL (ref 0.1–1.0)
Monocytes Relative: 10 %
Neutro Abs: 3.4 10*3/uL (ref 1.7–7.7)
Neutrophils Relative %: 72 %
Platelets: 181 10*3/uL (ref 150–400)
RBC: 4.69 MIL/uL (ref 4.22–5.81)
RDW: 14.8 % (ref 11.5–15.5)
WBC: 4.8 10*3/uL (ref 4.0–10.5)
nRBC: 0 % (ref 0.0–0.2)

## 2018-09-02 MED ORDER — IOHEXOL 300 MG/ML  SOLN
75.0000 mL | Freq: Once | INTRAMUSCULAR | Status: AC | PRN
  Administered 2018-09-02: 75 mL via INTRAVENOUS

## 2018-09-03 ENCOUNTER — Inpatient Hospital Stay (HOSPITAL_BASED_OUTPATIENT_CLINIC_OR_DEPARTMENT_OTHER): Payer: PPO | Admitting: Hematology

## 2018-09-03 ENCOUNTER — Inpatient Hospital Stay (HOSPITAL_COMMUNITY): Payer: PPO

## 2018-09-03 ENCOUNTER — Encounter (HOSPITAL_COMMUNITY): Payer: Self-pay | Admitting: Hematology

## 2018-09-03 VITALS — BP 160/82 | HR 56 | Temp 96.8°F | Resp 16

## 2018-09-03 DIAGNOSIS — G620 Drug-induced polyneuropathy: Secondary | ICD-10-CM

## 2018-09-03 DIAGNOSIS — C3490 Malignant neoplasm of unspecified part of unspecified bronchus or lung: Secondary | ICD-10-CM

## 2018-09-03 DIAGNOSIS — Z79899 Other long term (current) drug therapy: Secondary | ICD-10-CM

## 2018-09-03 DIAGNOSIS — K7460 Unspecified cirrhosis of liver: Secondary | ICD-10-CM

## 2018-09-03 DIAGNOSIS — C3411 Malignant neoplasm of upper lobe, right bronchus or lung: Secondary | ICD-10-CM | POA: Diagnosis not present

## 2018-09-03 DIAGNOSIS — J449 Chronic obstructive pulmonary disease, unspecified: Secondary | ICD-10-CM

## 2018-09-03 DIAGNOSIS — F1011 Alcohol abuse, in remission: Secondary | ICD-10-CM

## 2018-09-03 DIAGNOSIS — E039 Hypothyroidism, unspecified: Secondary | ICD-10-CM | POA: Diagnosis not present

## 2018-09-03 DIAGNOSIS — F17210 Nicotine dependence, cigarettes, uncomplicated: Secondary | ICD-10-CM | POA: Diagnosis not present

## 2018-09-03 DIAGNOSIS — Z5112 Encounter for antineoplastic immunotherapy: Secondary | ICD-10-CM | POA: Diagnosis not present

## 2018-09-03 LAB — TSH: TSH: 0.252 u[IU]/mL — ABNORMAL LOW (ref 0.350–4.500)

## 2018-09-03 MED ORDER — SODIUM CHLORIDE 0.9 % IV SOLN
Freq: Once | INTRAVENOUS | Status: AC
  Administered 2018-09-03: 10:00:00 via INTRAVENOUS

## 2018-09-03 MED ORDER — HEPARIN SOD (PORK) LOCK FLUSH 100 UNIT/ML IV SOLN
500.0000 [IU] | Freq: Once | INTRAVENOUS | Status: AC | PRN
  Administered 2018-09-03: 500 [IU]

## 2018-09-03 MED ORDER — SODIUM CHLORIDE 0.9% FLUSH
10.0000 mL | INTRAVENOUS | Status: DC | PRN
  Administered 2018-09-03: 10 mL
  Filled 2018-09-03: qty 10

## 2018-09-03 MED ORDER — SODIUM CHLORIDE 0.9 % IV SOLN
10.6000 mg/kg | Freq: Once | INTRAVENOUS | Status: AC
  Administered 2018-09-03: 860 mg via INTRAVENOUS
  Filled 2018-09-03: qty 7.2

## 2018-09-03 NOTE — Assessment & Plan Note (Addendum)
1.  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  Small focus of moderate increased uptake in the L4 transverse process, cannot rule out metastasis. - Chemoradiation therapy with carboplatin and paclitaxel weekly from 06/11/2017 through 07/17/2018. - Durvalumab consolidation started on 09/20/2017. - He reported worsening shortness of breath. -2D echo on 08/16/2018 shows EF of 60 to 65%. - Today saturations are 94% on room air.  He was evaluated by Dr. Hilma Favors for home oxygen. - We reviewed results of the CT of the chest with contrast dated 09/02/2018 which did not show any evidence of pneumonitis.  Stable postradiation changes and nodule in the right upper lobe.  No new consolidative changes.  No evidence of metastatic disease.  Cirrhosis was seen. - I have recommended continuing durvalumab today. -He will come back in 2 weeks for follow-up and received his last dose.  2.  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  No medication is needed.  3.  Hypothyroidism: -Last TSH was 1.35 on 05/24/2018.  He will continue Synthroid 100 mcg daily.

## 2018-09-03 NOTE — Progress Notes (Signed)
[address] [place], [place] [address]   CLINIC:  Medical Oncology/Hematology  PCP:  Sharilyn Sites, [place] [address] [phone]   REASON FOR VISIT:  Follow-up for lung cancer.  CURRENT THERAPY: Durvalumab maintenance.  BRIEF ONCOLOGIC HISTORY:  Oncology History  Carcinoma, lung ([place])  06/05/2017 Initial Diagnosis   Carcinoma, lung ([place])   06/05/2017 - 07/18/2017 Chemotherapy   The patient had palonosetron (ALOXI) injection 0.25 mg, 0.25 mg, Intravenous,  Once, 6 of 6 cycles Administration: 0.25 mg (06/11/2017), 0.25 mg (07/09/2017), 0.25 mg (07/16/2017), 0.25 mg (06/18/2017), 0.25 mg (06/25/2017), 0.25 mg (07/02/2017) CARBOplatin (PARAPLATIN) 190 mg in sodium chloride 0.9 % 250 mL chemo infusion, 190 mg (100 % of original dose 192.8 mg), Intravenous,  Once, 6 of 6 cycles Dose modification:   (original dose 192.8 mg, Cycle 1),   (original dose 192.8 mg, Cycle 5), 144.6 mg (original dose 144.6 mg, Cycle 6),   (original dose 192.8 mg, Cycle 2),   (original dose 192.8 mg, Cycle 3),   (original dose 192.8 mg, Cycle 4) Administration: 190 mg (06/11/2017), 190 mg (07/09/2017), 140 mg (07/16/2017), 190 mg (06/18/2017), 190 mg (06/25/2017), 190 mg (07/02/2017) PACLitaxel (TAXOL) 90 mg in dextrose 5 % 250 mL chemo infusion (</= 62m/m2), 45 mg/m2 = 90 mg, Intravenous,  Once, 6 of 6 cycles Dose modification: 40.5 mg/m2 (90 % of original dose 45 mg/m2, Cycle 5, Reason: Other (see comments), Comment: early neuropathy), 36 mg/m2 (80 % of original dose 45 mg/m2, Cycle 6, Reason: Provider Judgment) Administration: 90 mg (06/11/2017), 78 mg (07/09/2017), 72 mg (07/16/2017), 90 mg (06/18/2017), 78 mg (06/25/2017), 78 mg (07/02/2017)  for chemotherapy treatment.    Malignant neoplasm of bronchus and lung ([facility]  06/08/2017 Initial Diagnosis   Malignant neoplasm of bronchus and lung ([facility]   09/20/2017 -  Chemotherapy   The patient had durvalumab (IMFINZI) 860 mg in  sodium chloride 0.9 % 100 mL chemo infusion, 800 mg, Intravenous,  Once, 25 of 26 cycles Administration: 860 mg (09/20/2017), 860 mg (10/04/2017), 860 mg (10/18/2017), 860 mg (11/01/2017), 860 mg (11/15/2017), 860 mg (11/29/2017), 860 mg (12/13/2017), 860 mg (12/27/2017), 860 mg (01/10/2018), 860 mg (01/28/2018), 860 mg (02/11/2018), 860 mg (02/25/2018), 860 mg (03/11/2018), 860 mg (03/25/2018), 860 mg (04/08/2018), 860 mg (04/22/2018), 860 mg (05/08/2018), 860 mg (05/27/2018), 860 mg (06/10/2018), 860 mg (06/25/2018), 860 mg (07/09/2018), 860 mg (07/23/2018), 860 mg (08/06/2018), 860 mg (08/20/2018), 860 mg (09/03/2018)  for chemotherapy treatment.       CANCER STAGING: Cancer Staging No matching staging information was found for the patient.   INTERVAL HISTORY:  Mr. [name] male seen for follow-up of his lung cancer.  Denies any dry cough.  Shortness of breath on exertion present.  Appetite is 100%.  Energy levels are 50%.  Urinary frequency is reported.  Numbness in the legs and feet has been stable.  Denies any fevers, night sweats or weight loss.  Denies any ER visits or hospitalizations.  Denies any nausea vomiting diarrhea or constipation.  No skin rashes were reported.    REVIEW OF SYSTEMS:  Review of Systems  Respiratory: Positive for shortness of breath.   Genitourinary: Positive for frequency.   Neurological: Positive for numbness.  All other systems reviewed and are negative.    PAST MEDICAL/SURGICAL HISTORY:  Past Medical History:  Diagnosis Date  . Anxiety   . Arthritis   . Cirrhosis ([facility]    confirmed by MRI on 07/04/11.    .Marland Kitchen  COPD (chronic obstructive pulmonary disease) ([place])   . Depression with anxiety   . ETOH abuse   . Hyperlipidemia   . Nodule of upper lobe of right lung    with mediastinal adenopathy  . Wears dentures   . Wears glasses    Past Surgical History:  Procedure Laterality Date  . bilateral cataract surgery     [place]  . broken arm  6 yrs ago   left otif  of wrist-[place]  . CLOSED REDUCTION WRIST FRACTURE Right 09/02/2015   Procedure: RIGHT WRIST REDUCTION;  Surgeon: Renette Butters, MD;  Location: [facility];  Service: Orthopedics;  Laterality: Right;  with MAC  . COLONOSCOPY  1996   [facility]: external hemorrhoids, no polyps  . COLONOSCOPY  06/28/2011   Dr. Ala Bent diverticulosis,tubular adenoma, hyperplastic polyp  . COLONOSCOPY WITH PROPOFOL N/A 04/26/2017   Dr. Gala Romney: perianal and digital rectal examinations normal, diffusely congested colonic mucosa but otherwise normal  . ESOPHAGOGASTRODUODENOSCOPY  06/28/2011   Dr. Chelsea Aus erosive reflux esophagitis, hiatal hernia-gastritis  . ESOPHAGOGASTRODUODENOSCOPY (EGD) WITH PROPOFOL N/A 04/26/2017   Dr. Gala Romney: normal esophagus, small hiatal hernia, GAVE, portal hypertensive gastropathy, normal duodenal bulb and second portion, no specimens collected. 2 years screening   . EXTERNAL EAR SURGERY     right ear-cleaned out ear and created new eardrum  . INGUINAL HERNIA REPAIR  2-3 yrs ago   left-[facility]  . INTRAMEDULLARY (IM) NAIL INTERTROCHANTERIC Right 09/02/2015   Procedure: RIGHT  INTERTROCHANTRIC HIP;  Surgeon: Renette Butters, MD;  Location: [facility];  Service: Orthopedics;  Laterality: Right;  With MAC  . KNEE SURGERY     left-arthroscopy-[place]  . PORTACATH PLACEMENT Left 06/08/2017   Procedure: INSERTION POWER PORT WITH  ATTACHED CATHETER LEFT SUBCLAVIAN;  Surgeon: Aviva Signs, MD;  Location: [facility];  Service: General;  Laterality: Left;  . SKULL FRACTURE ELEVATION     fractured cheeck bone repaired  . TOTAL HIP ARTHROPLASTY  12/18/2011   Procedure: TOTAL HIP ARTHROPLASTY;  Surgeon: Carole Civil, MD;  Location: [facility];  Service: Orthopedics;  Laterality: Left;  Marland Kitchen VIDEO BRONCHOSCOPY WITH ENDOBRONCHIAL ULTRASOUND N/A 05/04/2017   Procedure: VIDEO BRONCHOSCOPY WITH ENDOBRONCHIAL ULTRASOUND;  Surgeon: Melrose Nakayama, MD;  Location: [facility];  Service: Thoracic;  Laterality: N/A;  .  WISDOM TOOTH EXTRACTION       SOCIAL HISTORY:  Social History   Socioeconomic History  . Marital status: Married    Spouse name: Not on file  . Number of children: Not on file  . Years of education: Not on file  . Highest education level: Not on file  Occupational History  . Occupation: retired    Fish farm manager: [employer]  Social Needs  . Financial resource strain: Not on file  . Food insecurity    Worry: Not on file    Inability: Not on file  . Transportation needs    Medical: Not on file    Non-medical: Not on file  Tobacco Use  . Smoking status: Current Every Day Smoker    Packs/day: 0.50    Years: 62.00    Pack years: 31.00    Types: Cigarettes  . Smokeless tobacco: Never Used  . Tobacco comment: 1/2 ppd > 60 years as of 06/27/17: 7 cigarettes or less a day  Substance and Sexual Activity  . Alcohol use: Not Currently    Comment: history of ETOH abuse, stopped 1.5 years ago (had one drink at [place] [employer]).   . Drug  use: No  . Sexual activity: Yes    Birth control/protection: None  Lifestyle  . Physical activity    Days per week: Not on file    Minutes per session: Not on file  . Stress: Not on file  Relationships  . Social Herbalist on phone: Not on file    Gets together: Not on file    Attends religious service: Not on file    Active member of club or organization: Not on file    Attends meetings of clubs or organizations: Not on file    Relationship status: Not on file  . Intimate partner violence    Fear of current or ex partner: Not on file    Emotionally abused: Not on file    Physically abused: Not on file    Forced sexual activity: Not on file  Other Topics Concern  . Not on file  Social History Narrative  . Not on file    FAMILY HISTORY:  Family History  Problem Relation Age of Onset  . Stroke Mother 13  . Heart disease Father 27       MI  . Diabetes Maternal Uncle   . Colon cancer Neg Hx   . Cancer Neg Hx      CURRENT MEDICATIONS:  Outpatient Encounter Medications as of 09/03/2018  Medication Sig  . budesonide-formoterol (SYMBICORT) 160-4.5 MCG/ACT inhaler Inhale 2 puffs into the lungs 2 (two) times daily.  . cholecalciferol (VITAMIN D) 1000 units tablet Take 1,000 Units by mouth daily.  [place] (IMFINZI IV) Inject into the vein.  . fluticasone (FLONASE) 50 MCG/ACT nasal spray PLACE 2 SPRAYS IEN QD  . levothyroxine (SYNTHROID, LEVOTHROID) 100 MCG tablet Take 1 tablet (100 mcg total) by mouth daily before breakfast.  . meclizine (ANTIVERT) 25 MG tablet TK 1 T PO  Q 6 H PRN  . Nutritional Supplements (ENSURE ENLIVE PO) Take 237 mLs by mouth daily.   Marland Kitchen PROAIR HFA 108 (90 Base) MCG/ACT inhaler Inhale 2 puffs into the lungs every 6 (six) hours as needed (for shortness of breath/wheezing/cough).   . Propylene Glycol-Glycerin (SOOTHE) 0.6-0.6 % SOLN Place 1-2 drops into both eyes 3 (three) times daily as needed (for dry/irritated eyes.).  Marland Kitchen SPIRIVA HANDIHALER 18 MCG inhalation capsule INHALE CONTENTS OF 1 CAPSULE VIA HANDHALER QD  . [DISCONTINUED] XARELTO 15 MG TABS tablet    No facility-administered encounter medications on file as of 09/03/2018.     ALLERGIES:  No Known Allergies   PHYSICAL EXAM:  ECOG Performance status: 1  Vitals:   09/03/18 0901  BP: (!) 156/76  Pulse: 88  Resp: 20  Temp: (!) 97.5 F (36.4 C)  SpO2: 94%   Filed Weights   09/03/18 0901  Weight: 170 lb 8 oz (77.3 kg)    Physical Exam Vitals signs reviewed.  Constitutional:      Appearance: Normal appearance.  Cardiovascular:     Rate and Rhythm: Normal rate and regular rhythm.     Heart sounds: Normal heart sounds.  Pulmonary:     Effort: Pulmonary effort is normal.     Breath sounds: Normal breath sounds.  Abdominal:     General: There is no distension.     Palpations: Abdomen is soft. There is no mass.  Musculoskeletal:        General: No swelling.  Skin:    General: Skin is warm.  Neurological:      General: No focal deficit present.  Mental Status: He is alert and oriented to person, place, and time.  Psychiatric:        Mood and Affect: Mood normal.        Behavior: Behavior normal.      LABORATORY DATA:  I have reviewed the labs as listed.  CBC    Component Value Date/Time   WBC 4.8 09/02/2018 1116   RBC 4.69 09/02/2018 1116   HGB 14.4 09/02/2018 1116   HCT 45.3 09/02/2018 1116   PLT 181 09/02/2018 1116   MCV 96.6 09/02/2018 1116   MCH 30.7 09/02/2018 1116   MCHC 31.8 09/02/2018 1116   RDW 14.8 09/02/2018 1116   LYMPHSABS 0.8 09/02/2018 1116   MONOABS 0.5 09/02/2018 1116   EOSABS 0.1 09/02/2018 1116   BASOSABS 0.0 09/02/2018 1116   CMP Latest Ref Rng & Units 09/02/2018 08/16/2018 08/05/2018  Glucose 70 - 99 mg/dL 83 119(H) 90  BUN 8 - 23 mg/dL _0 Creatinine 0.61 - 1.24 mg/dL 0.83 0.89 0.85  Sodium 135 - 145 mmol/L 140 140 140  Potassium 3.5 - 5.1 mmol/L 3.7 3.7 4.1  Chloride 98 - 111 mmol/L 102 99 100  CO2 22 - 32 mmol/L _1 Calcium 8.9 - 10.3 mg/dL 9.2 9.0 9.0  Total Protein 6.5 - 8.1 g/dL 7.2 7.1 7.2  Total Bilirubin 0.3 - 1.2 mg/dL 0.7 0.9 1.1  Alkaline Phos 38 - 126 U/L 80 82 74  AST 15 - 41 U/L _2 ALT 0 - 44 U/L _3 DIAGNOSTIC IMAGING:  I have independently reviewed the scans and discussed with the patient.     ASSESSMENT & PLAN:   Malignant neoplasm of bronchus and lung ([facility]) 1.  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  Small focus of moderate increased uptake in the L4 transverse process, cannot rule out metastasis. - Chemoradiation therapy with carboplatin and paclitaxel weekly from 06/11/2017 through 07/17/2018. - Durvalumab consolidation started on 09/20/2017. - He reported worsening shortness of breath. -2D echo on 08/16/2018 shows EF of 60 to 65%. - Today saturations are  94% on room air.  He was evaluated by Dr. Hilma Favors for home oxygen. - We reviewed results of the CT of the chest with contrast dated 09/02/2018 which did not show any evidence of pneumonitis.  Stable postradiation changes and nodule in the right upper lobe.  No new consolidative changes.  No evidence of metastatic disease.  Cirrhosis was seen. - I have recommended continuing durvalumab today. -He will come back in 2 weeks for follow-up and received his last dose.  2.  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  No medication is needed.  3.  Hypothyroidism: -Last TSH was 1.35 on 05/24/2018.  He will continue Synthroid 100 mcg daily.   Total time spent is 25 minutes with more than 50% of the time spent face-to-face discussing scan results, treatment plan and coordination of care.  Orders placed this encounter:  No orders of the defined types were placed in this encounter.     Derek Jack, MD [place] [phone]

## 2018-09-03 NOTE — Progress Notes (Signed)
Pt seen by Dr. Delton Coombes today. Labs reviewed from 09/02/18. TSH drawn and sent to lab today. Labs within parameters for tx. VSS.  Treatment given today per MD orders. Tolerated infusion without adverse affects. Vital signs stable. No complaints at this time. Discharged from clinic ambulatory. F/U with [facility] as scheduled.

## 2018-09-03 NOTE — Patient Instructions (Addendum)
[facility] at [facility] Discharge Instructions  You were seen today by Dr. Delton Coombes. He reviewed how you have been feeling.  He reviewed your recent scan results and it shows that there is no cancer.  We will proceed with treatment today and then you will have one more treatment and you will be finished with your treatments.     Thank you for choosing [place] at [facility] to provide your oncology and hematology care.  To afford each patient quality time with our provider, please arrive at least 15 minutes before your scheduled appointment time.   If you have a lab appointment with the [facility] please come in thru the  Main Entrance and check in at the main information desk  You need to re-schedule your appointment should you arrive 10 or more minutes late.  We strive to give you quality time with our providers, and arriving late affects you and other patients whose appointments are after yours.  Also, if you no show three or more times for appointments you may be dismissed from the clinic at the providers discretion.     Again, thank you for choosing [facility].  Our hope is that these requests will decrease the amount of time that you wait before being seen by our physicians.       _____________________________________________________________  Should you have questions after your visit to [facility], please contact our office at [phone] between the hours of 8:00 a.m. and 4:30 p.m.  Voicemails left after 4:00 p.m. will not be returned until the following business day.  For prescription refill requests, have your pharmacy contact our office and allow 72 hours.    Cancer Center Support Programs:   > Cancer Support Group  2nd Tuesday of the month 1pm-2pm, Journey Room

## 2018-09-06 DIAGNOSIS — J449 Chronic obstructive pulmonary disease, unspecified: Secondary | ICD-10-CM | POA: Diagnosis not present

## 2018-09-13 ENCOUNTER — Other Ambulatory Visit (HOSPITAL_COMMUNITY): Payer: Self-pay | Admitting: *Deleted

## 2018-09-13 DIAGNOSIS — C3490 Malignant neoplasm of unspecified part of unspecified bronchus or lung: Secondary | ICD-10-CM

## 2018-09-13 DIAGNOSIS — C3491 Malignant neoplasm of unspecified part of right bronchus or lung: Secondary | ICD-10-CM

## 2018-09-16 ENCOUNTER — Inpatient Hospital Stay (HOSPITAL_COMMUNITY): Payer: PPO

## 2018-09-16 ENCOUNTER — Other Ambulatory Visit: Payer: Self-pay

## 2018-09-16 DIAGNOSIS — C3491 Malignant neoplasm of unspecified part of right bronchus or lung: Secondary | ICD-10-CM

## 2018-09-16 DIAGNOSIS — C3490 Malignant neoplasm of unspecified part of unspecified bronchus or lung: Secondary | ICD-10-CM

## 2018-09-16 DIAGNOSIS — Z5112 Encounter for antineoplastic immunotherapy: Secondary | ICD-10-CM | POA: Diagnosis not present

## 2018-09-16 LAB — CBC WITH DIFFERENTIAL/PLATELET
Abs Immature Granulocytes: 0.01 10*3/uL (ref 0.00–0.07)
Basophils Absolute: 0 10*3/uL (ref 0.0–0.1)
Basophils Relative: 1 %
Eosinophils Absolute: 0.1 10*3/uL (ref 0.0–0.5)
Eosinophils Relative: 3 %
HCT: 44.7 % (ref 39.0–52.0)
Hemoglobin: 14.1 g/dL (ref 13.0–17.0)
Immature Granulocytes: 0 %
Lymphocytes Relative: 16 %
Lymphs Abs: 0.8 10*3/uL (ref 0.7–4.0)
MCH: 30.9 pg (ref 26.0–34.0)
MCHC: 31.5 g/dL (ref 30.0–36.0)
MCV: 98 fL (ref 80.0–100.0)
Monocytes Absolute: 0.4 10*3/uL (ref 0.1–1.0)
Monocytes Relative: 8 %
Neutro Abs: 3.5 10*3/uL (ref 1.7–7.7)
Neutrophils Relative %: 72 %
Platelets: 149 10*3/uL — ABNORMAL LOW (ref 150–400)
RBC: 4.56 MIL/uL (ref 4.22–5.81)
RDW: 14.5 % (ref 11.5–15.5)
WBC: 4.9 10*3/uL (ref 4.0–10.5)
nRBC: 0 % (ref 0.0–0.2)

## 2018-09-16 LAB — COMPREHENSIVE METABOLIC PANEL
ALT: 13 U/L (ref 0–44)
AST: 21 U/L (ref 15–41)
Albumin: 4 g/dL (ref 3.5–5.0)
Alkaline Phosphatase: 77 U/L (ref 38–126)
Anion gap: 8 (ref 5–15)
BUN: 26 mg/dL — ABNORMAL HIGH (ref 8–23)
CO2: 30 mmol/L (ref 22–32)
Calcium: 9.2 mg/dL (ref 8.9–10.3)
Chloride: 106 mmol/L (ref 98–111)
Creatinine, Ser: 0.96 mg/dL (ref 0.61–1.24)
GFR calc Af Amer: >60 mL/min (ref >60)
GFR calc non Af Amer: >60 mL/min (ref >60)
Glucose, Bld: 113 mg/dL — ABNORMAL HIGH (ref 70–99)
Potassium: 3.9 mmol/L (ref 3.5–5.1)
Sodium: 144 mmol/L (ref 135–145)
Total Bilirubin: 0.9 mg/dL (ref 0.3–1.2)
Total Protein: 7.1 g/dL (ref 6.5–8.1)

## 2018-09-16 LAB — TSH: TSH: 0.375 u[IU]/mL (ref 0.350–4.500)

## 2018-09-17 ENCOUNTER — Inpatient Hospital Stay (HOSPITAL_COMMUNITY): Payer: PPO

## 2018-09-17 ENCOUNTER — Encounter (HOSPITAL_COMMUNITY): Payer: Self-pay | Admitting: Hematology

## 2018-09-17 ENCOUNTER — Inpatient Hospital Stay (HOSPITAL_BASED_OUTPATIENT_CLINIC_OR_DEPARTMENT_OTHER): Payer: PPO | Admitting: Hematology

## 2018-09-17 VITALS — BP 155/66 | HR 53 | Temp 97.8°F | Resp 18

## 2018-09-17 DIAGNOSIS — C3411 Malignant neoplasm of upper lobe, right bronchus or lung: Secondary | ICD-10-CM | POA: Diagnosis not present

## 2018-09-17 DIAGNOSIS — Z5112 Encounter for antineoplastic immunotherapy: Secondary | ICD-10-CM | POA: Diagnosis not present

## 2018-09-17 DIAGNOSIS — C3490 Malignant neoplasm of unspecified part of unspecified bronchus or lung: Secondary | ICD-10-CM

## 2018-09-17 DIAGNOSIS — G620 Drug-induced polyneuropathy: Secondary | ICD-10-CM | POA: Diagnosis not present

## 2018-09-17 DIAGNOSIS — F17210 Nicotine dependence, cigarettes, uncomplicated: Secondary | ICD-10-CM

## 2018-09-17 DIAGNOSIS — E039 Hypothyroidism, unspecified: Secondary | ICD-10-CM | POA: Diagnosis not present

## 2018-09-17 MED ORDER — HEPARIN SOD (PORK) LOCK FLUSH 100 UNIT/ML IV SOLN
500.0000 [IU] | Freq: Once | INTRAVENOUS | Status: AC | PRN
  Administered 2018-09-17: 500 [IU]

## 2018-09-17 MED ORDER — SODIUM CHLORIDE 0.9 % IV SOLN
10.6000 mg/kg | Freq: Once | INTRAVENOUS | Status: AC
  Administered 2018-09-17: 860 mg via INTRAVENOUS
  Filled 2018-09-17: qty 10

## 2018-09-17 MED ORDER — SODIUM CHLORIDE 0.9% FLUSH
10.0000 mL | INTRAVENOUS | Status: DC | PRN
  Administered 2018-09-17: 10 mL
  Filled 2018-09-17: qty 10

## 2018-09-17 MED ORDER — SODIUM CHLORIDE 0.9 % IV SOLN
Freq: Once | INTRAVENOUS | Status: AC
  Administered 2018-09-17: 11:00:00 via INTRAVENOUS

## 2018-09-17 NOTE — Assessment & Plan Note (Addendum)
1.  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  Small focus of moderate increased uptake in the L4 transverse process, cannot rule out metastasis. - Chemoradiation therapy with carboplatin and paclitaxel weekly from 06/11/2017 through 07/17/2018. - Durvalumab consolidation started on 09/20/2017. - He reported worsening shortness of breath. -2D echo on 08/16/2018 shows EF of 60 to 65%. - He was put on home oxygen and he is sleeping well. -CT of the chest with contrast on 09/02/2018 did not show any evidence of pneumonitis.  Stable postradiation changes and nodule in the right upper lobe.  No new consolidative changes.  No evidence of metastatic disease.  Cirrhosis was seen. - He does not report any immunotherapy related side effects.  He will proceed with his last cycle of durvalumab today. -I will reevaluate him in 1 month.  I plan to repeat CT scan of the chest in 3 months from the last scan.  2.  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  No medication is needed.  3.  Hypothyroidism: -TSH today is 0.375.  We will continue Synthroid at the same dose.

## 2018-09-17 NOTE — Progress Notes (Signed)
[place], [place] [address]   CLINIC:  Medical Oncology/Hematology  PCP:  Ian Aguilar, [place] [address] [phone]   REASON FOR VISIT:  Follow-up for lung cancer.  CURRENT THERAPY: Durvalumab maintenance.  BRIEF ONCOLOGIC HISTORY:  Oncology History  Carcinoma, lung ([place])  06/05/2017 Initial Diagnosis   Carcinoma, lung ([place])   06/05/2017 - 07/18/2017 Chemotherapy   The patient had palonosetron (ALOXI) injection 0.25 mg, 0.25 mg, Intravenous,  Once, 6 of 6 cycles Administration: 0.25 mg (06/11/2017), 0.25 mg (07/09/2017), 0.25 mg (07/16/2017), 0.25 mg (06/18/2017), 0.25 mg (06/25/2017), 0.25 mg (07/02/2017) CARBOplatin (PARAPLATIN) 190 mg in sodium chloride 0.9 % 250 mL chemo infusion, 190 mg (100 % of original dose 192.8 mg), Intravenous,  Once, 6 of 6 cycles Dose modification:   (original dose 192.8 mg, Cycle 1),   (original dose 192.8 mg, Cycle 5), 144.6 mg (original dose 144.6 mg, Cycle 6),   (original dose 192.8 mg, Cycle 2),   (original dose 192.8 mg, Cycle 3),   (original dose 192.8 mg, Cycle 4) Administration: 190 mg (06/11/2017), 190 mg (07/09/2017), 140 mg (07/16/2017), 190 mg (06/18/2017), 190 mg (06/25/2017), 190 mg (07/02/2017) PACLitaxel (TAXOL) 90 mg in dextrose 5 % 250 mL chemo infusion (</= 58m/m2), 45 mg/m2 = 90 mg, Intravenous,  Once, 6 of 6 cycles Dose modification: 40.5 mg/m2 (90 % of original dose 45 mg/m2, Cycle 5, Reason: Other (see comments), Comment: early neuropathy), 36 mg/m2 (80 % of original dose 45 mg/m2, Cycle 6, Reason: Provider Judgment) Administration: 90 mg (06/11/2017), 78 mg (07/09/2017), 72 mg (07/16/2017), 90 mg (06/18/2017), 78 mg (06/25/2017), 78 mg (07/02/2017)  for chemotherapy treatment.    [place] ([facility]  06/08/2017 Initial Diagnosis   Malign[facility] of [facility]us and lung ([facility]   09/20/2017 -  Chemotherapy   The patient had durvalumab (IMFINZI) 860 mg in  sodium chloride 0.9 % 100 mL chemo infusion, 800 mg, Intravenous,  Once, 26 of 26 cycles Administration: 860 mg (09/20/2017), 860 mg (10/04/2017), 860 mg (10/18/2017), 860 mg (11/01/2017), 860 mg (11/15/2017), 860 mg (11/29/2017), 860 mg (12/13/2017), 860 mg (12/27/2017), 860 mg (01/10/2018), 860 mg (01/28/2018), 860 mg (02/11/2018), 860 mg (02/25/2018), 860 mg (03/11/2018), 860 mg (03/25/2018), 860 mg (04/08/2018), 860 mg (04/22/2018), 860 mg (05/08/2018), 860 mg (05/27/2018), 860 mg (06/10/2018), 860 mg (06/25/2018), 860 mg (07/09/2018), 860 mg (07/23/2018), 860 mg (08/06/2018), 860 mg (08/20/2018), 860 mg (09/03/2018), 860 mg (09/17/2018)  for chemotherapy treatment.       CANCER STAGING: Cancer Staging No matching staging information was found for the patient.   INTERVAL HISTORY:  Mr. JUllman89y.o. male seen for follow-up of lung cancer and next cycle of chemotherapy.  Appetite is 100%.  Energy levels are 50%.  No pain reported.  Shortness of breath on exertion is stable.  He started using oxygen at nighttime and is able to sleep well.  Denies any fevers or infections.  Baseline cough is stable.  No ER visits or hospitalizations.    REVIEW OF SYSTEMS:  Review of Systems  Constitutional: Positive for fatigue.  Respiratory: Positive for cough and shortness of breath.   Genitourinary: Positive for frequency.   Neurological: Positive for numbness.  All other systems reviewed and are negative.    PAST MEDICAL/SURGICAL HISTORY:  Past Medical History:  Diagnosis Date  . Anxiety   . Arthritis   . Cirrhosis ([facility]    confirmed by MRI on 07/04/11.    .Marland Kitchen  COPD (chronic obstructive pulmonary disease) ([place])   . Depression with anxiety   . ETOH abuse   . Hyperlipidemia   . Nodule of upper lobe of right lung    with mediastinal adenopathy  . Wears dentures   . Wears glasses    Past Surgical History:  Procedure Laterality Date  . bilateral cataract surgery     [place]  . broken arm  6 yrs ago   left otif  of wrist-[place]  . CLOSED REDUCTION WRIST FRACTURE Right 09/02/2015   Procedure: RIGHT WRIST REDUCTION;  Surgeon: Renette Butters, MD;  Location: [facility];  Service: Orthopedics;  Laterality: Right;  with MAC  . COLONOSCOPY  1996   [facility]: external hemorrhoids, no polyps  . COLONOSCOPY  06/28/2011   Dr. Ala Bent diverticulosis,tubular adenoma, hyperplastic polyp  . COLONOSCOPY WITH PROPOFOL N/A 04/26/2017   Dr. Gala Romney: perianal and digital rectal examinations normal, diffusely congested colonic mucosa but otherwise normal  . ESOPHAGOGASTRODUODENOSCOPY  06/28/2011   Dr. Chelsea Aus erosive reflux esophagitis, hiatal hernia-gastritis  . ESOPHAGOGASTRODUODENOSCOPY (EGD) WITH PROPOFOL N/A 04/26/2017   Dr. Gala Romney: normal esophagus, small hiatal hernia, GAVE, portal hypertensive gastropathy, normal duodenal bulb and second portion, no specimens collected. 2 years screening   . EXTERNAL EAR SURGERY     right ear-cleaned out ear and created new eardrum  . INGUINAL HERNIA REPAIR  2-3 yrs ago   left-[facility]_  . INTRAMEDULLARY (IM) NAIL INTERTROCHANTERIC Right 09/02/2015   Procedure: RIGHT  INTERTROCHANTRIC HIP;  Surgeon: Renette Butters, MD;  Location: [facility];  Service: Orthopedics;  Laterality: Right;  With MAC  . KNEE SURGERY     left-arthroscopy-[place]  . PORTACATH PLACEMENT Left 06/08/2017   Procedure: INSERTION POWER PORT WITH  ATTACHED CATHETER LEFT SUBCLAVIAN;  Surgeon: Aviva Signs, MD;  Location: [facility];  Service: General;  Laterality: Left;  . SKULL FRACTURE ELEVATION     fractured cheeck bone repaired  . TOTAL HIP ARTHROPLASTY  12/18/2011   Procedure: TOTAL HIP ARTHROPLASTY;  Surgeon: Carole Civil, MD;  Location: [facility];  Service: Orthopedics;  Laterality: Left;  Marland Kitchen VIDEO BRONCHOSCOPY WITH ENDOBRONCHIAL ULTRASOUND N/A 05/04/2017   Procedure: VIDEO BRONCHOSCOPY WITH ENDOBRONCHIAL ULTRASOUND;  Surgeon: Melrose Nakayama, MD;  Location: [facility];  Service: Thoracic;  Laterality: N/A;  .  WISDOM TOOTH EXTRACTION       SOCIAL HISTORY:  Social History   Socioeconomic History  . Marital status: Married    Spouse name: Not on file  . Number of children: Not on file  . Years of education: Not on file  . Highest education level: Not on file  Occupational History  . Occupation: retired    Fish farm manager: [employer]  Social Needs  . Financial resource strain: Not on file  . Food insecurity    Worry: Not on file    Inability: Not on file  . Transportation needs    Medical: Not on file    Non-medical: Not on file  Tobacco Use  . Smoking status: Current Every Day Smoker    Packs/day: 0.50    Years: 62.00    Pack years: 31.00    Types: Cigarettes  . Smokeless tobacco: Never Used  . Tobacco comment: 1/2 ppd > 60 years as of 06/27/17: 7 cigarettes or less a day  Substance and Sexual Activity  . Alcohol use: Not Currently    Comment: history of ETOH abuse, stopped 1.5 years ago (had one drink at [place] [employer]).   . Drug  use: No  . Sexual activity: Yes    Birth control/protection: None  Lifestyle  . Physical activity    Days per week: Not on file    Minutes per session: Not on file  . Stress: Not on file  Relationships  . Social Herbalist on phone: Not on file    Gets together: Not on file    Attends religious service: Not on file    Active member of club or organization: Not on file    Attends meetings of clubs or organizations: Not on file    Relationship status: Not on file  . Intimate partner violence    Fear of current or ex partner: Not on file    Emotionally abused: Not on file    Physically abused: Not on file    Forced sexual activity: Not on file  Other Topics Concern  . Not on file  Social History Narrative  . Not on file    FAMILY HISTORY:  Family History  Problem Relation Age of Onset  . Stroke Mother 66  . Heart disease Father 20       MI  . Diabetes Maternal Uncle   . Colon cancer Neg Hx   . Cancer Neg Hx      CURRENT MEDICATIONS:  Outpatient Encounter Medications as of 09/17/2018  Medication Sig  . budesonide-formoterol (SYMBICORT) 160-4.5 MCG/ACT inhaler Inhale 2 puffs into the lungs 2 (two) times daily.  . cholecalciferol (VITAMIN D) 1000 units tablet Take 1,000 Units by mouth daily.  [place] Oris (IMFINZI IV) Inject into the vein.  . fluticasone (FLONASE) 50 MCG/ACT nasal spray PLACE 2 SPRAYS IEN QD  . levothyroxine (SYNTHROID, LEVOTHROID) 100 MCG tablet Take 1 tablet (100 mcg total) by mouth daily before breakfast.  . meclizine (ANTIVERT) 25 MG tablet TK 1 T PO  Q 6 H PRN  . Nutritional Supplements (ENSURE ENLIVE PO) Take 237 mLs by mouth daily.   Marland Kitchen PROAIR HFA 108 (90 Base) MCG/ACT inhaler Inhale 2 puffs into the lungs every 6 (six) hours as needed (for shortness of breath/wheezing/cough).   . Propylene Glycol-Glycerin (SOOTHE) 0.6-0.6 % SOLN Place 1-2 drops into both eyes 3 (three) times daily as needed (for dry/irritated eyes.).  Marland Kitchen SPIRIVA HANDIHALER 18 MCG inhalation capsule INHALE CONTENTS OF 1 CAPSULE VIA HANDHALER QD   No facility-administered encounter medications on file as of 09/17/2018.     ALLERGIES:  No Known Allergies   PHYSICAL EXAM:  ECOG Performance status: 1  Vitals:   09/17/18 0947  BP: 130/62  Pulse: 63  Resp: 20  Temp: 98.2 F (36.8 C)  SpO2: 96%   Filed Weights   09/17/18 0947  Weight: 171 lb 6.4 oz (77.7 kg)    Physical Exam Vitals signs reviewed.  Constitutional:      Appearance: Normal appearance.  Cardiovascular:     Rate and Rhythm: Normal rate and regular rhythm.     Heart sounds: Normal heart sounds.  Pulmonary:     Effort: Pulmonary effort is normal.     Breath sounds: Normal breath sounds.  Abdominal:     General: There is no distension.     Palpations: Abdomen is soft. There is no mass.  Musculoskeletal:        General: No swelling.  Skin:    General: Skin is warm.  Neurological:     General: No focal deficit present.     Mental  Status: He is alert and oriented to person,  place, and time.  Psychiatric:        Mood and Affect: Mood normal.        Behavior: Behavior normal.      LABORATORY DATA:  I have reviewed the labs as listed.  CBC    Component Value Date/Time   WBC 4.9 09/16/2018 1016   RBC 4.56 09/16/2018 1016   HGB 14.1 09/16/2018 1016   HCT 44.7 09/16/2018 1016   PLT 149 (L) 09/16/2018 1016   MCV 98.0 09/16/2018 1016   MCH 30.9 09/16/2018 1016   MCHC 31.5 09/16/2018 1016   RDW 14.5 09/16/2018 1016   LYMPHSABS 0.8 09/16/2018 1016   MONOABS 0.4 09/16/2018 1016   EOSABS 0.1 09/16/2018 1016   BASOSABS 0.0 09/16/2018 1016   CMP Latest Ref Rng & Units 09/16/2018 09/02/2018 08/16/2018  Glucose 70 - 99 mg/dL 113(H) 83 119(H)  BUN 8 - 23 mg/dL 26(H) 21 16  Creatinine 0.61 - 1.24 mg/dL 0.96 0.83 0.89  Sodium 135 - 145 mmol/L 144 140 140  Potassium 3.5 - 5.1 mmol/L 3.9 3.7 3.7  Chloride 98 - 111 mmol/L 106 102 99  CO2 22 - 32 mmol/L _0 Calcium 8.9 - 10.3 mg/dL 9.2 9.2 9.0  Total Protein 6.5 - 8.1 g/dL 7.1 7.2 7.1  Total Bilirubin 0.3 - 1.2 mg/dL 0.9 0.7 0.9  Alkaline Phos 38 - 126 U/L 77 80 82  AST 15 - 41 U/L _1 ALT 0 - 44 U/L _2 DIAGNOSTIC IMAGING:  I have independently reviewed the scans and discussed with the patient.     ASSESSMENT & PLAN:   Malignant neoplasm of bronchus and lung ([facility]) 1.  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  Small focus of moderate increased uptake in the L4 transverse process, cannot rule out metastasis. - Chemoradiation therapy with carboplatin and paclitaxel weekly from 06/11/2017 through 07/17/2018. - Durvalumab consolidation started on 09/20/2017. - He reported worsening shortness of breath. -2D echo on 08/16/2018 shows EF of 60 to 65%. - He was put on home oxygen and he is sleeping well. -CT of  the chest with contrast on 09/02/2018 did not show any evidence of pneumonitis.  Stable postradiation changes and nodule in the right upper lobe.  No new consolidative changes.  No evidence of metastatic disease.  Cirrhosis was seen. - He does not report any immunotherapy related side effects.  He will proceed with his last cycle of durvalumab today. -I will reevaluate him in 1 month.  I plan to repeat CT scan of the chest in 3 months from the last scan.  2.  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  No medication is needed.  3.  Hypothyroidism: -TSH today is 0.375.  We will continue Synthroid at the same dose.   Total time spent is 25 minutes with more than 50% of the time spent face-to-face discussing scan results, treatment plan and coordination of care.  Orders placed this encounter:  No orders of the defined types were placed in this encounter.     Derek Jack, MD [place] [phone]

## 2018-09-17 NOTE — Patient Instructions (Signed)
[facility] Discharge Instructions for Patients Receiving Chemotherapy   Beginning January 23rd 2017 lab work for the [facility] will be done in the  Main lab at [facility] on 1st floor. If you have a lab appointment with the [facility] please come in thru the  Main Entrance and check in at the main information desk   Today you received the following chemotherapy agents Imfinzi. Follow-up as scheduled. Call clinic for any questions or concerns  To help prevent nausea and vomiting after your treatment, we encourage you to take your nausea medication   If you develop nausea and vomiting, or diarrhea that is not controlled by your medication, call the clinic.  The clinic phone number is [phone]. Office hours are Monday-Friday 8:30am-5:00pm.  BELOW ARE SYMPTOMS THAT SHOULD BE REPORTED IMMEDIATELY:  *FEVER GREATER THAN 101.0 F  *CHILLS WITH OR WITHOUT FEVER  NAUSEA AND VOMITING THAT IS NOT CONTROLLED WITH YOUR NAUSEA MEDICATION  *UNUSUAL SHORTNESS OF BREATH  *UNUSUAL BRUISING OR BLEEDING  TENDERNESS IN MOUTH AND THROAT WITH OR WITHOUT PRESENCE OF ULCERS  *URINARY PROBLEMS  *BOWEL PROBLEMS  UNUSUAL RASH Items with * indicate a potential emergency and should be followed up as soon as possible. If you have an emergency after office hours please contact your primary care physician or go to the nearest emergency department.  Please call the clinic during office hours if you have any questions or concerns.   You may also contact the Patient Navigator at [phone] should you have any questions or need assistance in obtaining follow up care.      Resources For Cancer Patients and their Caregivers ? American Cancer Society: Can assist with transportation, wigs, general needs, runs Look Good Feel Better.        769 235 8261 ? Cancer Care: Provides financial assistance, online support groups, medication/co-pay assistance.  1-800-813-HOPE  305-562-7817) ? [facility] Assists [place] cancer patients and their families through emotional , educational and financial support.  6146714500 ? [place] DSS Where to apply for food stamps, Medicaid and utility assistance. 651-561-3409 ? [facility]: Transportation to medical appointments. (647) 596-9177 ? Social Security Administration: May apply for disability if have a Stage IV cancer. 408-012-7571 231-377-2845 ? [employer], Disability and Transit Services: Assists with nutrition, care and transit needs. [phone]

## 2018-09-17 NOTE — Progress Notes (Signed)
1040 Labs reviewed with and pt seen by Dr. Delton Coombes and pt approved for Imfinzi infusion today per MD                                                                               Ian Aguilar tolerated Imfinzi infusion well without complaints or incident. VSS upon discharge. Pt discharged self ambulatory using his cane in satisfactory condition

## 2018-09-17 NOTE — Patient Instructions (Signed)
[facility] at [facility] Discharge Instructions  You were seen today by Dr. Katragadda. He went over your recent lab results. He will see you back in 4 weeks for labs and follow up.   Thank you for choosing [facility] at [facility] to provide your oncology and hematology care.  To afford each patient quality time with our provider, please arrive at least 15 minutes before your scheduled appointment time.   If you have a lab appointment with the Cancer Center please come in thru the  Main Entrance and check in at the main information desk  You need to re-schedule your appointment should you arrive 10 or more minutes late.  We strive to give you quality time with our providers, and arriving late affects you and other patients whose appointments are after yours.  Also, if you no show three or more times for appointments you may be dismissed from the clinic at the providers discretion.     Again, thank you for choosing [facility].  Our hope is that these requests will decrease the amount of time that you wait before being seen by our physicians.       _____________________________________________________________  Should you have questions after your visit to  Cancer Center, please contact our office at [phone] between the hours of 8:00 a.m. and 4:30 p.m.  Voicemails left after 4:00 p.m. will not be returned until the following business day.  For prescription refill requests, have your pharmacy contact our office and allow 72 hours.    Cancer Center Support Programs:   > Cancer Support Group  2nd Tuesday of the month 1pm-2pm, Journey Room    

## 2018-10-07 DIAGNOSIS — J449 Chronic obstructive pulmonary disease, unspecified: Secondary | ICD-10-CM | POA: Diagnosis not present

## 2018-10-15 ENCOUNTER — Other Ambulatory Visit (HOSPITAL_COMMUNITY): Payer: Self-pay | Admitting: *Deleted

## 2018-10-15 DIAGNOSIS — C3490 Malignant neoplasm of unspecified part of unspecified bronchus or lung: Secondary | ICD-10-CM

## 2018-10-15 DIAGNOSIS — C3491 Malignant neoplasm of unspecified part of right bronchus or lung: Secondary | ICD-10-CM

## 2018-10-16 ENCOUNTER — Inpatient Hospital Stay (HOSPITAL_COMMUNITY): Payer: PPO | Attending: Hematology

## 2018-10-16 ENCOUNTER — Other Ambulatory Visit: Payer: Self-pay

## 2018-10-16 DIAGNOSIS — C3491 Malignant neoplasm of unspecified part of right bronchus or lung: Secondary | ICD-10-CM

## 2018-10-16 DIAGNOSIS — R0602 Shortness of breath: Secondary | ICD-10-CM | POA: Insufficient documentation

## 2018-10-16 DIAGNOSIS — C3411 Malignant neoplasm of upper lobe, right bronchus or lung: Secondary | ICD-10-CM | POA: Diagnosis not present

## 2018-10-16 DIAGNOSIS — G629 Polyneuropathy, unspecified: Secondary | ICD-10-CM | POA: Diagnosis not present

## 2018-10-16 DIAGNOSIS — E039 Hypothyroidism, unspecified: Secondary | ICD-10-CM | POA: Insufficient documentation

## 2018-10-16 DIAGNOSIS — F17210 Nicotine dependence, cigarettes, uncomplicated: Secondary | ICD-10-CM | POA: Insufficient documentation

## 2018-10-16 DIAGNOSIS — J449 Chronic obstructive pulmonary disease, unspecified: Secondary | ICD-10-CM | POA: Diagnosis not present

## 2018-10-16 DIAGNOSIS — C3490 Malignant neoplasm of unspecified part of unspecified bronchus or lung: Secondary | ICD-10-CM

## 2018-10-16 LAB — COMPREHENSIVE METABOLIC PANEL
ALT: 14 U/L (ref 0–44)
AST: 21 U/L (ref 15–41)
Albumin: 3.9 g/dL (ref 3.5–5.0)
Alkaline Phosphatase: 88 U/L (ref 38–126)
Anion gap: 9 (ref 5–15)
BUN: 16 mg/dL (ref 8–23)
CO2: 31 mmol/L (ref 22–32)
Calcium: 9.1 mg/dL (ref 8.9–10.3)
Chloride: 100 mmol/L (ref 98–111)
Creatinine, Ser: 0.83 mg/dL (ref 0.61–1.24)
GFR calc Af Amer: >60 mL/min (ref >60)
GFR calc non Af Amer: >60 mL/min (ref >60)
Glucose, Bld: 108 mg/dL — ABNORMAL HIGH (ref 70–99)
Potassium: 3.8 mmol/L (ref 3.5–5.1)
Sodium: 140 mmol/L (ref 135–145)
Total Bilirubin: 0.8 mg/dL (ref 0.3–1.2)
Total Protein: 7.1 g/dL (ref 6.5–8.1)

## 2018-10-16 LAB — CBC WITH DIFFERENTIAL/PLATELET
Abs Immature Granulocytes: 0.01 10*3/uL (ref 0.00–0.07)
Basophils Absolute: 0 10*3/uL (ref 0.0–0.1)
Basophils Relative: 0 %
Eosinophils Absolute: 0.2 10*3/uL (ref 0.0–0.5)
Eosinophils Relative: 4 %
HCT: 42.4 % (ref 39.0–52.0)
Hemoglobin: 13.6 g/dL (ref 13.0–17.0)
Immature Granulocytes: 0 %
Lymphocytes Relative: 15 %
Lymphs Abs: 0.8 10*3/uL (ref 0.7–4.0)
MCH: 30.6 pg (ref 26.0–34.0)
MCHC: 32.1 g/dL (ref 30.0–36.0)
MCV: 95.5 fL (ref 80.0–100.0)
Monocytes Absolute: 0.5 10*3/uL (ref 0.1–1.0)
Monocytes Relative: 10 %
Neutro Abs: 3.6 10*3/uL (ref 1.7–7.7)
Neutrophils Relative %: 71 %
Platelets: 156 10*3/uL (ref 150–400)
RBC: 4.44 MIL/uL (ref 4.22–5.81)
RDW: 14.7 % (ref 11.5–15.5)
WBC: 5.1 10*3/uL (ref 4.0–10.5)
nRBC: 0 % (ref 0.0–0.2)

## 2018-10-17 ENCOUNTER — Inpatient Hospital Stay (HOSPITAL_BASED_OUTPATIENT_CLINIC_OR_DEPARTMENT_OTHER): Payer: PPO | Admitting: Hematology

## 2018-10-17 ENCOUNTER — Encounter (HOSPITAL_COMMUNITY): Payer: Self-pay | Admitting: Hematology

## 2018-10-17 ENCOUNTER — Other Ambulatory Visit: Payer: Self-pay

## 2018-10-17 VITALS — BP 150/78 | HR 87 | Temp 98.4°F | Resp 18 | Wt 174.5 lb

## 2018-10-17 DIAGNOSIS — C3490 Malignant neoplasm of unspecified part of unspecified bronchus or lung: Secondary | ICD-10-CM | POA: Diagnosis not present

## 2018-10-17 DIAGNOSIS — C3491 Malignant neoplasm of unspecified part of right bronchus or lung: Secondary | ICD-10-CM

## 2018-10-17 DIAGNOSIS — C3411 Malignant neoplasm of upper lobe, right bronchus or lung: Secondary | ICD-10-CM | POA: Diagnosis not present

## 2018-10-17 NOTE — Patient Instructions (Signed)
[facility] at [facility] Discharge Instructions  Follow up in 2 months with labs and ct scan   Thank you for choosing [facility] at [facility] to provide your oncology and hematology care.  To afford each patient quality time with our provider, please arrive at least 15 minutes before your scheduled appointment time.   If you have a lab appointment with the [facility] please come in thru the  Main Entrance and check in at the main information desk  You need to re-schedule your appointment should you arrive 10 or more minutes late.  We strive to give you quality time with our providers, and arriving late affects you and other patients whose appointments are after yours.  Also, if you no show three or more times for appointments you may be dismissed from the clinic at the providers discretion.     Again, thank you for choosing [facility].  Our hope is that these requests will decrease the amount of time that you wait before being seen by our physicians.       _____________________________________________________________  Should you have questions after your visit to [facility], please contact our office at [phone] between the hours of 8:00 a.m. and 4:30 p.m.  Voicemails left after 4:00 p.m. will not be returned until the following business day.  For prescription refill requests, have your pharmacy contact our office and allow 72 hours.    Cancer Center Support Programs:   > Cancer Support Group  2nd Tuesday of the month 1pm-2pm, Journey Room

## 2018-10-17 NOTE — Progress Notes (Signed)
[address] [place], [place] [address]   CLINIC:  Medical Oncology/Hematology  PCP:  Sharilyn Sites, [place] [address] [phone]   REASON FOR VISIT: Follow-up for lung cancer.  CURRENT THERAPY:  Close observation.  BRIEF ONCOLOGIC HISTORY:  Oncology History  Carcinoma, lung ([place])  06/05/2017 Initial Diagnosis   Carcinoma, lung ([place])   06/05/2017 - 07/18/2017 Chemotherapy   The patient had palonosetron (ALOXI) injection 0.25 mg, 0.25 mg, Intravenous,  Once, 6 of 6 cycles Administration: 0.25 mg (06/11/2017), 0.25 mg (07/09/2017), 0.25 mg (07/16/2017), 0.25 mg (06/18/2017), 0.25 mg (06/25/2017), 0.25 mg (07/02/2017) CARBOplatin (PARAPLATIN) 190 mg in sodium chloride 0.9 % 250 mL chemo infusion, 190 mg (100 % of original dose 192.8 mg), Intravenous,  Once, 6 of 6 cycles Dose modification:   (original dose 192.8 mg, Cycle 1),   (original dose 192.8 mg, Cycle 5), 144.6 mg (original dose 144.6 mg, Cycle 6),   (original dose 192.8 mg, Cycle 2),   (original dose 192.8 mg, Cycle 3),   (original dose 192.8 mg, Cycle 4) Administration: 190 mg (06/11/2017), 190 mg (07/09/2017), 140 mg (07/16/2017), 190 mg (06/18/2017), 190 mg (06/25/2017), 190 mg (07/02/2017) PACLitaxel (TAXOL) 90 mg in dextrose 5 % 250 mL chemo infusion (</= 17m/m2), 45 mg/m2 = 90 mg, Intravenous,  Once, 6 of 6 cycles Dose modification: 40.5 mg/m2 (90 % of original dose 45 mg/m2, Cycle 5, Reason: Other (see comments), Comment: early neuropathy), 36 mg/m2 (80 % of original dose 45 mg/m2, Cycle 6, Reason: Provider Judgment) Administration: 90 mg (06/11/2017), 78 mg (07/09/2017), 72 mg (07/16/2017), 90 mg (06/18/2017), 78 mg (06/25/2017), 78 mg (07/02/2017)  for chemotherapy treatment.    Malignant ne[place] of bronchus and lung ([place]  06/08/2017 Initial Diagnosis   Malignant ne[place] of [place]us and lung ([place]   09/20/2017 -  Chemotherapy   The patient had durvalumab (IMFINZI) 860 mg in sodium  chloride 0.9 % 100 mL chemo infusion, 800 mg, Intravenous,  Once, 26 of 26 cycles Administration: 860 mg (09/20/2017), 860 mg (10/04/2017), 860 mg (10/18/2017), 860 mg (11/01/2017), 860 mg (11/15/2017), 860 mg (11/29/2017), 860 mg (12/13/2017), 860 mg (12/27/2017), 860 mg (01/10/2018), 860 mg (01/28/2018), 860 mg (02/11/2018), 860 mg (02/25/2018), 860 mg (03/11/2018), 860 mg (03/25/2018), 860 mg (04/08/2018), 860 mg (04/22/2018), 860 mg (05/08/2018), 860 mg (05/27/2018), 860 mg (06/10/2018), 860 mg (06/25/2018), 860 mg (07/09/2018), 860 mg (07/23/2018), 860 mg (08/06/2018), 860 mg (08/20/2018), 860 mg (09/03/2018), 860 mg (09/17/2018)  for chemotherapy treatment.       INTERVAL HISTORY:  Mr. [id number].o. male returns for routine follow-up for lung cancer. He reports he is feeling great since his last visit. He has no complaints at this time. Denies any nausea, vomiting, or diarrhea. Denies any new pains. Had not noticed any recent bleeding such as epistaxis, hematuria or hematochezia. Denies recent chest pain on exertion, shortness of breath on minimal exertion, pre-syncopal episodes, or palpitations. Denies any numbness or tingling in hands or feet. Denies any recent fevers, infections, or recent hospitalizations. Patient reports appetite at 100% and energy level at 50%. He is eating well and maintaining his weight at this time.      REVIEW OF SYSTEMS:  Review of Systems  Respiratory: Positive for shortness of breath.   All other systems reviewed and are negative.    PAST MEDICAL/SURGICAL HISTORY:  Past Medical History:  Diagnosis Date  . Anxiety   . Arthritis   . Cir[place]osis ([place]  confirmed by MRI on 07/04/11.    Marland Kitchen COPD (chronic obstructive pulmonary disease) ([place])   . Depression with anxiety   . ETOH abuse   . Hyperlipidemia   . Nodule of upper lobe of right lung    with mediastinal adenopathy  . Wears dentures   . Wears glasses    Past Surgical History:  Procedure Laterality Date  . bilateral  cataract surgery     [place]  . broken arm  6 yrs ago   left otif of wrist-[facility]  . CLOSED REDUCTION WRIST FRACTURE Right 09/02/2015   Procedure: RIGHT WRIST REDUCTION;  Surgeon: Renette Butters, MD;  Location: [facility];  Service: Orthopedics;  Laterality: Right;  with MAC  . COLONOSCOPY  1996   [facility]: external hemorrhoids, no polyps  . COLONOSCOPY  06/28/2011   Dr. Ala Bent diverticulosis,tubular adenoma, hyperplastic polyp  . COLONOSCOPY WITH PROPOFOL N/A 04/26/2017   Dr. Gala Romney: perianal and digital rectal examinations normal, diffusely congested colonic mucosa but otherwise normal  . ESOPHAGOGASTRODUODENOSCOPY  06/28/2011   Dr. Chelsea Aus erosive reflux esophagitis, hiatal hernia-gastritis  . ESOPHAGOGASTRODUODENOSCOPY (EGD) WITH PROPOFOL N/A 04/26/2017   Dr. Gala Romney: normal esophagus, small hiatal hernia, GAVE, portal hypertensive gastropathy, normal duodenal bulb and second portion, no specimens collected. 2 years screening   . EXTERNAL EAR SURGERY     right ear-cleaned out ear and created new eardrum  . INGUINAL HERNIA REPAIR  2-3 yrs ago   left-[facility]  . INTRAMEDULLARY (IM) NAIL INTERTROCHANTERIC Right 09/02/2015   Procedure: RIGHT  INTERTROCHANTRIC HIP;  Surgeon: Renette Butters, MD;  Location: [facility];  Service: Orthopedics;  Laterality: Right;  With MAC  . KNEE SURGERY     left-arthroscopy-[facility]  . PORTACATH PLACEMENT Left 06/08/2017   Procedure: INSERTION POWER PORT WITH  ATTACHED CATHETER LEFT SUBCLAVIAN;  Surgeon: Aviva Signs, MD;  [facility];  Service: General;  Laterality: Left;  . SKULL FRACTURE ELEVATION     fractured cheeck bone repaired  . TOTAL HIP ARTHROPLASTY  12/18/2011   Procedure: TOTAL HIP ARTHROPLASTY;  Surgeon: Carole Civil, MD;  [facility];  Service: Orthopedics;  Laterality: Left;  Marland Kitchen VIDEO BRONCHOSCOPY WITH ENDOBRONCHIAL ULTRASOUND N/A 05/04/2017   Procedure: VIDEO BRONCHOSCOPY WITH ENDOBRONCHIAL ULTRASOUND;  Surgeon: Melrose Nakayama, MD;  Location: [facility];  Service: Thoracic;  Laterality: N/A;  . WISDOM TOOTH EXTRACTION       SOCIAL HISTORY:  Social History   Socioeconomic History  . Marital status: Married    Spouse name: Not on file  . Number of children: Not on file  . Years of education: Not on file  . Highest education level: Not on file  Occupational History  . Occupation: retired    Fish farm manager: [employer]  Social Needs  . Financial resource strain: Not on file  . Food insecurity    Worry: Not on file    Inability: Not on file  . Transportation needs    Medical: Not on file    Non-medical: Not on file  Tobacco Use  . Smoking status: Current Every Day Smoker    Packs/day: 0.50    Years: 62.00    Pack years: 31.00    Types: Cigarettes  . Smokeless tobacco: Never Used  . Tobacco comment: 1/2 ppd > 60 years as of 06/27/17: 7 cigarettes or less a day  Substance and Sexual Activity  . Alcohol use: Not Currently    Comment: history of ETOH abuse, stopped 1.5 years ago (had  one drink [place] [employer]   . Drug use: No  . Sexual activity: Yes    Birth control/protection: None  Lifestyle  . Physical activity    Days per week: Not on file    Minutes per session: Not on file  . Stress: Not on file  Relationships  . Social Herbalist on phone: Not on file    Gets together: Not on file    Attends religious service: Not on file    Active member of club or organization: Not on file    Attends meetings of clubs or organizations: Not on file    Relationship status: Not on file  . Intimate partner violence    Fear of current or ex partner: Not on file    Emotionally abused: Not on file    Physically abused: Not on file    Forced sexual activity: Not on file  Other Topics Concern  . Not on file  Social History Narrative  . Not on file    FAMILY HISTORY:  Family History  Problem Relation Age of Onset  . Stroke Mother 14  . Heart disease Father 1       MI  .  Diabetes Maternal Uncle   . Colon cancer Neg Hx   . Cancer Neg Hx     CURRENT MEDICATIONS:  Outpatient Encounter Medications as of 10/17/2018  Medication Sig  . budesonide-formoterol (SYMBICORT) 160-4.5 MCG/ACT inhaler Inhale 2 puffs into the lungs 2 (two) times daily.  . cholecalciferol (VITAMIN D) 1000 units tablet Take 1,000 Units by mouth daily.  . fluticasone (FLONASE) 50 MCG/ACT nasal spray PLACE 2 SPRAYS IEN QD  . levothyroxine (SYNTHROID, LEVOTHROID) 100 MCG tablet Take 1 tablet (100 mcg total) by mouth daily before breakfast.  . PROAIR HFA 108 (90 Base) MCG/ACT inhaler Inhale 2 puffs into the lungs every 6 (six) hours as needed (for shortness of breath/wheezing/cough).   . Propylene Glycol-Glycerin (SOOTHE) 0.6-0.6 % SOLN Place 1-2 drops into both eyes 3 (three) times daily as needed (for dry/irritated eyes.).  Marland Kitchen SPIRIVA HANDIHALER 18 MCG inhalation capsule INHALE CONTENTS OF 1 CAPSULE VIA HANDHALER QD  . meclizine (ANTIVERT) 25 MG tablet TK 1 T PO  Q 6 H PRN  . Nutritional Supplements (ENSURE ENLIVE PO) Take 237 mLs by mouth as needed (for poor intake).   . [DISCONTINUED] Durvalumab (IMFINZI IV) Inject into the vein.   No facility-administered encounter medications on file as of 10/17/2018.     ALLERGIES:  No Known Allergies   PHYSICAL EXAM:  ECOG Performance status: 1  Vitals:   10/17/18 1510  BP: (!) 150/78  Pulse: 87  Resp: 18  Temp: 98.4 F (36.9 C)  SpO2: 94%   Filed Weights   10/17/18 1510  Weight: 174 lb 8 oz (79.2 kg)    Physical Exam Constitutional:      Appearance: Normal appearance. He is normal weight.  Cardiovascular:     Rate and Rhythm: Normal rate and regular rhythm.     Heart sounds: Normal heart sounds.  Pulmonary:     Effort: Pulmonary effort is normal.     Breath sounds: Normal breath sounds.  Abdominal:     General: Bowel sounds are normal.     Palpations: Abdomen is soft.  Musculoskeletal: Normal range of motion.  Skin:     General: Skin is warm and dry.  Neurological:     Mental Status: He is alert and oriented to person, place, and  time. Mental status is at baseline.  Psychiatric:        Mood and Affect: Mood normal.        Behavior: Behavior normal.        Thought Content: Thought content normal.        Judgment: Judgment normal.      LABORATORY DATA:  I have reviewed the labs as listed.  CBC    Component Value Date/Time   WBC 5.1 10/16/2018 1031   RBC 4.44 10/16/2018 1031   HGB 13.6 10/16/2018 1031   HCT 42.4 10/16/2018 1031   PLT 156 10/16/2018 1031   MCV 95.5 10/16/2018 1031   MCH 30.6 10/16/2018 1031   MCHC 32.1 10/16/2018 1031   RDW 14.7 10/16/2018 1031   LYMPHSABS 0.8 10/16/2018 1031   MONOABS 0.5 10/16/2018 1031   EOSABS 0.2 10/16/2018 1031   BASOSABS 0.0 10/16/2018 1031   CMP Latest Ref Rng & Units 10/16/2018 09/16/2018 09/02/2018  Glucose 70 - 99 mg/dL 108(H) 113(H) 83  BUN 8 - 23 mg/dL 16 26(H) 21  Creatinine 0.61 - 1.24 mg/dL 0.83 0.96 0.83  Sodium 135 - 145 mmol/L 140 144 140  Potassium 3.5 - 5.1 mmol/L 3.8 3.9 3.7  Chloride 98 - 111 mmol/L 100 106 102  CO2 22 - 32 mmol/L _0 Calcium 8.9 - 10.3 mg/dL 9.1 9.2 9.2  Total Protein 6.5 - 8.1 g/dL 7.1 7.1 7.2  Total Bilirubin 0.3 - 1.2 mg/dL 0.8 0.9 0.7  Alkaline Phos 38 - 126 U/L 88 77 80  AST 15 - 41 U/L _1 ALT 0 - 44 U/L _2 DIAGNOSTIC IMAGING:  I have independently reviewed the scans and discussed with the patient.   I have reviewed Francene Finders, NP's note and agree with the documentation.  I personally performed a face-to-face visit, made revisions and my assessment and plan is as follows.    ASSESSMENT & PLAN:   Malignant neoplasm of bronchus and lung ([place]) 1.  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  Small focus of moderately increased uptake in the L4  transverse vertebral process, cannot rule out metastasis. -Chemoradiation therapy with carboplatin and paclitaxel weekly from 06/11/2017 through 07/17/2018. - Durvalumab consolidation from 09/20/2017 through 09/17/2018. - Last CT of the chest with contrast on 09/02/2018 did not show any evidence of pneumonitis.  Stable postradiation changes and nodule in the right upper lobe.  No new consolidative changes.  No evidence of metastatic disease.  Cirrhosis was seen. - We reviewed his blood work.  His breathing is better when he is using oxygen at nighttime. -I plan to see him back in 2 months with repeat CT scan of the chest with contrast.  2.  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  No intervention needed.  3.  Hypothyroidism: -TSH is 0.375.  He will continue Synthroid at the same dose.   Total time spent is 25 minutes with more than 50% of the time spent face-to-face discussing surveillance plan, counseling and coordination of care.  Orders placed this encounter:  Orders Placed This Encounter  Procedures  . CT CHEST W CONTRAST  . TSH  . CBC with Differential/Platelet  . Comprehensive metabolic panel      Derek Jack, MD [place] [phone]

## 2018-10-20 NOTE — Assessment & Plan Note (Signed)
1.  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  Small focus of moderately increased uptake in the L4 transverse vertebral process, cannot rule out metastasis. -Chemoradiation therapy with carboplatin and paclitaxel weekly from 06/11/2017 through 07/17/2018. - Durvalumab consolidation from 09/20/2017 through 09/17/2018. - Last CT of the chest with contrast on 09/02/2018 did not show any evidence of pneumonitis.  Stable postradiation changes and nodule in the right upper lobe.  No new consolidative changes.  No evidence of metastatic disease.  Cirrhosis was seen. - We reviewed his blood work.  His breathing is better when he is using oxygen at nighttime. -I plan to see him back in 2 months with repeat CT scan of the chest with contrast.  2.  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  No intervention needed.  3.  Hypothyroidism: -TSH is 0.375.  He will continue Synthroid at the same dose.

## 2018-11-07 DIAGNOSIS — J449 Chronic obstructive pulmonary disease, unspecified: Secondary | ICD-10-CM | POA: Diagnosis not present

## 2018-11-11 ENCOUNTER — Other Ambulatory Visit (HOSPITAL_COMMUNITY): Payer: Self-pay | Admitting: Nurse Practitioner

## 2018-11-11 DIAGNOSIS — C3491 Malignant neoplasm of unspecified part of right bronchus or lung: Secondary | ICD-10-CM

## 2018-11-27 ENCOUNTER — Telehealth: Payer: Self-pay | Admitting: Internal Medicine

## 2018-11-27 NOTE — Telephone Encounter (Signed)
Recall for ultrasound 

## 2018-11-27 NOTE — Telephone Encounter (Signed)
Recall sent 

## 2018-12-03 ENCOUNTER — Telehealth: Payer: Self-pay

## 2018-12-03 ENCOUNTER — Ambulatory Visit (INDEPENDENT_AMBULATORY_CARE_PROVIDER_SITE_OTHER): Payer: PPO | Admitting: Gastroenterology

## 2018-12-03 ENCOUNTER — Encounter: Payer: Self-pay | Admitting: Gastroenterology

## 2018-12-03 ENCOUNTER — Other Ambulatory Visit: Payer: Self-pay

## 2018-12-03 ENCOUNTER — Encounter: Payer: Self-pay | Admitting: Internal Medicine

## 2018-12-03 VITALS — BP 153/84 | HR 81 | Temp 96.9°F | Ht 71.0 in | Wt 174.6 lb

## 2018-12-03 DIAGNOSIS — K7030 Alcoholic cirrhosis of liver without ascites: Secondary | ICD-10-CM

## 2018-12-03 NOTE — Assessment & Plan Note (Signed)
History of ETOH cirrhosis, well-compensated, due for US abdomen now. EGD due in 2021. Will order INR to add to labs upcoming 10/16. Applauded on ETOH abstinence. Will have him return in Jan/Feb 2021 to arrange surveillance EGD.

## 2018-12-03 NOTE — Telephone Encounter (Signed)
[place] abd RUQ scheduled for 12/10/18 at 8:30am, arrive at 8:15am. NPO after midnight before test. Called and informed pt. Letter mailed.

## 2018-12-03 NOTE — Progress Notes (Signed)
Referring Provider: Sharilyn Sites, MD Primary Care Physician:  Sharilyn Sites, MD Primary GI: Dr. Gala Romney    Chief Complaint  Patient presents with  . Cirrhosis    HPI:   Ian Aguilar is a 78 y.o. 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 Next EGD due in 2021. Due for imaging now.   Taking Mucinex to help with congestion. Chronically SOB. Feels marginally better than the past Neuropathy of lower extremities. Decreased cigarettes down to 4 per day. No abdominal pain, N/V. No constipation or diarrhea. No rectal bleeding. Denies dysphagia. No confusion or mental status changes. States he is down to 4 cigarettes a day. Continues to follow closely with Oncology. Blood work upcoming on the 16th. Hep A/B vaccinations started in March 2020.    Past Medical History:  Diagnosis Date  . Anxiety   . Arthritis   . Cirrhosis ([place])    confirmed by MRI on 07/04/11.    Marland Kitchen COPD (chronic obstructive pulmonary disease) ([place])   . Depression with anxiety   . ETOH abuse   . Hyperlipidemia   . Nodule of upper lobe of right lung    with mediastinal adenopathy  . Wears dentures   . Wears glasses     Past Surgical History:  Procedure Laterality Date  . bilateral cataract surgery     [place]  . broken arm  6 yrs ago   left otif of wrist-[facility]  . CLOSED REDUCTION WRIST FRACTURE Right 09/02/2015   Procedure: RIGHT WRIST REDUCTION;  Surgeon: Renette Butters, MD;  Location: [facility];  Service: Orthopedics;  Laterality: Right;  with MAC  . COLONOSCOPY  1996   [facility]: external hemorrhoids, no polyps  . COLONOSCOPY  06/28/2011   Dr. Ala Bent diverticulosis,tubular adenoma, hyperplastic polyp  . COLONOSCOPY WITH PROPOFOL N/A 04/26/2017   Dr. Gala Romney: perianal and digital rectal examinations normal, diffusely congested colonic mucosa but  otherwise normal  . ESOPHAGOGASTRODUODENOSCOPY  06/28/2011   Dr. Chelsea Aus erosive reflux esophagitis, hiatal hernia-gastritis  . ESOPHAGOGASTRODUODENOSCOPY (EGD) WITH PROPOFOL N/A 04/26/2017   Dr. Gala Romney: normal esophagus, small hiatal hernia, GAVE, portal hypertensive gastropathy, normal duodenal bulb and second portion, no specimens collected. 2 years screening   . EXTERNAL EAR SURGERY     right ear-cleaned out ear and created new eardrum  . INGUINAL HERNIA REPAIR  2-3 yrs ago   left-[facility]  . INTRAMEDULLARY (IM) NAIL INTERTROCHANTERIC Right 09/02/2015   Procedure: RIGHT  INTERTROCHANTRIC HIP;  Surgeon: Renette Butters, MD;  Location: [facility];  Service: Orthopedics;  Laterality: Right;  With MAC  . KNEE SURGERY     left-arthroscopy-[place]  . PORTACATH PLACEMENT Left 06/08/2017   Procedure: INSERTION POWER PORT WITH  ATTACHED CATHETER LEFT SUBCLAVIAN;  Surgeon: Aviva Signs, MD;  [facility];  Service: General;  Laterality: Left;  . SKULL FRACTURE ELEVATION     fractured cheeck bone repaired  . TOTAL HIP ARTHROPLASTY  12/18/2011   Procedure: TOTAL HIP ARTHROPLASTY;  Surgeon: Carole Civil, MD;  [facility];  Service: Orthopedics;  Laterality: Left;  Marland Kitchen VIDEO BRONCHOSCOPY WITH ENDOBRONCHIAL ULTRASOUND N/A 05/04/2017   Procedure: VIDEO BRONCHOSCOPY WITH ENDOBRONCHIAL ULTRASOUND;  Surgeon: Melrose Nakayama, MD;  Location: [facility];  Service: Thoracic;  Laterality: N/A;  . WISDOM TOOTH EXTRACTION      Current Outpatient  Medications  Medication Sig Dispense Refill  . cholecalciferol (VITAMIN D) 1000 units tablet Take 1,000 Units by mouth daily.    Marland Kitchen guaiFENesin (MUCINEX PO) Take by mouth 2 (two) times daily.    Marland Kitchen levothyroxine (SYNTHROID) 100 MCG tablet TAKE 1 TABLET(100 MCG) BY MOUTH DAILY BEFORE BREAKFAST 90 tablet 1  . meclizine (ANTIVERT) 25 MG tablet TK 1 T PO  Q 6 H PRN  0  . Nutritional Supplements (ENSURE ENLIVE PO) Take 237 mLs by mouth as needed (for poor intake).      Marland Kitchen PROAIR HFA 108 (90 Base) MCG/ACT inhaler Inhale 2 puffs into the lungs every 6 (six) hours as needed (for shortness of breath/wheezing/cough).   2  . Propylene Glycol-Glycerin (SOOTHE) 0.6-0.6 % SOLN Place 1-2 drops into both eyes 3 (three) times daily as needed (for dry/irritated eyes.).    Marland Kitchen SPIRIVA HANDIHALER 18 MCG inhalation capsule INHALE CONTENTS OF 1 CAPSULE VIA HANDHALER QD  10   No current facility-administered medications for this visit.     Allergies as of 12/03/2018  . (No Known Allergies)    Family History  Problem Relation Age of Onset  . Stroke Mother 15  . Heart disease Father 61       MI  . Diabetes Maternal Uncle   . Colon cancer Neg Hx   . Cancer Neg Hx     Social History   Socioeconomic History  . Marital status: Married    Spouse name: Not on file  . Number of children: Not on file  . Years of education: Not on file  . Highest education level: Not on file  Occupational History  . Occupation: retired    Fish farm manager: [employer]  Social Needs  . Financial resource strain: Not on file  . Food insecurity    Worry: Not on file    Inability: Not on file  . Transportation needs    Medical: Not on file    Non-medical: Not on file  Tobacco Use  . Smoking status: Current Every Day Smoker    Packs/day: 0.50    Years: 62.00    Pack years: 31.00    Types: Cigarettes  . Smokeless tobacco: Never Used  . Tobacco comment: 1/2 ppd > 60 years as of 06/27/17: 4-5 cigarettes or less a day  Substance and Sexual Activity  . Alcohol use: Not Currently    Comment: stopped 4 years ago  . Drug use: No  . Sexual activity: Yes    Birth control/protection: None  Lifestyle  . Physical activity    Days per week: Not on file    Minutes per session: Not on file  . Stress: Not on file  Relationships  . Social Herbalist on phone: Not on file    Gets together: Not on file    Attends religious service: Not on file    Active member of club or  organization: Not on file    Attends meetings of clubs or organizations: Not on file    Relationship status: Not on file  Other Topics Concern  . Not on file  Social History Narrative  . Not on file    Review of Systems: Gen: Denies fever, chills, anorexia. Denies fatigue, weakness, weight loss.  CV: Denies chest pain, palpitations, syncope, peripheral edema, and claudication. Resp: Denies dyspnea at rest, cough, wheezing, coughing up blood, and pleurisy. GI: see HPI Derm: Denies rash, itching, dry skin Psych: Denies depression, anxiety, memory  loss, confusion. No homicidal or suicidal ideation.  Heme: Denies bruising, bleeding, and enlarged lymph nodes.  Physical Exam: BP (!) 153/84   Pulse 81   Temp (!) 96.9 F (36.1 C) (Temporal)   Ht _0  (1.803 m)   Wt 174 lb 9.6 oz (79.2 kg)   BMI 24.35 kg/m  General:   Alert and oriented. No distress noted. Pleasant and cooperative.  Head:  Normocephalic and atraumatic. Eyes:  Conjuctiva clear without scleral icterus. Abdomen:  +BS, soft, non-tender and non-distended. Rectus diastasis, +umbilical hernia.  Msk:  Symmetrical without gross deformities. Normal posture. Extremities:  Without edema. Neurologic:  Alert and  oriented x4 Psych:  Alert and cooperative. Normal mood and affect.  Lab Results  Component Value Date   WBC 5.1 10/16/2018   HGB 13.6 10/16/2018   HCT 42.4 10/16/2018   MCV 95.5 10/16/2018   PLT 156 10/16/2018   Lab Results  Component Value Date   ALT 14 10/16/2018   AST 21 10/16/2018   ALKPHOS 88 10/16/2018   BILITOT 0.8 10/16/2018   Lab Results  Component Value Date   CREATININE 0.83 10/16/2018   BUN 16 10/16/2018   NA 140 10/16/2018   K 3.8 10/16/2018   CL 100 10/16/2018   CO2 31 10/16/2018

## 2018-12-03 NOTE — Patient Instructions (Signed)
I have ordered extra blood work to be done when you go to the [facility].  We have also arranged an ultrasound of your liver for routine screening purposes.   We will see you back in Feb 2021 to arrange an upper endoscopy!  Keep up the good work!  I enjoyed seeing you again today! As you know, I value our relationship and want to provide genuine, compassionate, and quality care. I welcome your feedback. If you receive a survey regarding your visit,  I greatly appreciate you taking time to fill this out. See you next time!  Annitta Needs, PhD, ANP-BC [facility]

## 2018-12-07 DIAGNOSIS — J449 Chronic obstructive pulmonary disease, unspecified: Secondary | ICD-10-CM | POA: Diagnosis not present

## 2018-12-10 ENCOUNTER — Other Ambulatory Visit: Payer: Self-pay

## 2018-12-10 ENCOUNTER — Ambulatory Visit (HOSPITAL_COMMUNITY)
Admission: RE | Admit: 2018-12-10 | Discharge: 2018-12-10 | Disposition: A | Discharge status: Home / Self Care | Payer: PPO | Source: Ambulatory Visit | Attending: Gastroenterology | Admitting: Gastroenterology

## 2018-12-10 DIAGNOSIS — K7460 Unspecified cirrhosis of liver: Secondary | ICD-10-CM | POA: Diagnosis not present

## 2018-12-10 DIAGNOSIS — K7030 Alcoholic cirrhosis of liver without ascites: Secondary | ICD-10-CM

## 2018-12-10 NOTE — Progress Notes (Signed)
US abdomen with cirrhosis, no HCC, repeat in 6 months.

## 2018-12-11 NOTE — Progress Notes (Signed)
On recall  °

## 2018-12-13 ENCOUNTER — Ambulatory Visit (HOSPITAL_COMMUNITY)
Admission: RE | Admit: 2018-12-13 | Discharge: 2018-12-13 | Disposition: A | Discharge status: Home / Self Care | Payer: PPO | Source: Ambulatory Visit | Attending: Nurse Practitioner | Admitting: Nurse Practitioner

## 2018-12-13 ENCOUNTER — Other Ambulatory Visit: Payer: Self-pay

## 2018-12-13 ENCOUNTER — Encounter (HOSPITAL_COMMUNITY): Payer: Self-pay

## 2018-12-13 ENCOUNTER — Inpatient Hospital Stay (HOSPITAL_COMMUNITY): Payer: PPO | Attending: Hematology

## 2018-12-13 DIAGNOSIS — C3411 Malignant neoplasm of upper lobe, right bronchus or lung: Secondary | ICD-10-CM | POA: Insufficient documentation

## 2018-12-13 DIAGNOSIS — E039 Hypothyroidism, unspecified: Secondary | ICD-10-CM | POA: Insufficient documentation

## 2018-12-13 DIAGNOSIS — Z23 Encounter for immunization: Secondary | ICD-10-CM | POA: Insufficient documentation

## 2018-12-13 DIAGNOSIS — C3491 Malignant neoplasm of unspecified part of right bronchus or lung: Secondary | ICD-10-CM

## 2018-12-13 HISTORY — DX: Malignant (primary) neoplasm, unspecified: C80.1

## 2018-12-13 LAB — CBC WITH DIFFERENTIAL/PLATELET
Abs Immature Granulocytes: 0.01 10*3/uL (ref 0.00–0.07)
Basophils Absolute: 0 10*3/uL (ref 0.0–0.1)
Basophils Relative: 1 %
Eosinophils Absolute: 0.1 10*3/uL (ref 0.0–0.5)
Eosinophils Relative: 3 %
HCT: 45 % (ref 39.0–52.0)
Hemoglobin: 14.3 g/dL (ref 13.0–17.0)
Immature Granulocytes: 0 %
Lymphocytes Relative: 21 %
Lymphs Abs: 0.9 10*3/uL (ref 0.7–4.0)
MCH: 31.1 pg (ref 26.0–34.0)
MCHC: 31.8 g/dL (ref 30.0–36.0)
MCV: 97.8 fL (ref 80.0–100.0)
Monocytes Absolute: 0.4 10*3/uL (ref 0.1–1.0)
Monocytes Relative: 8 %
Neutro Abs: 2.8 10*3/uL (ref 1.7–7.7)
Neutrophils Relative %: 67 %
Platelets: 187 10*3/uL (ref 150–400)
RBC: 4.6 MIL/uL (ref 4.22–5.81)
RDW: 14.9 % (ref 11.5–15.5)
WBC: 4.2 10*3/uL (ref 4.0–10.5)
nRBC: 0 % (ref 0.0–0.2)

## 2018-12-13 LAB — COMPREHENSIVE METABOLIC PANEL
ALT: 14 U/L (ref 0–44)
AST: 23 U/L (ref 15–41)
Albumin: 4.4 g/dL (ref 3.5–5.0)
Alkaline Phosphatase: 84 U/L (ref 38–126)
Anion gap: 9 (ref 5–15)
BUN: 16 mg/dL (ref 8–23)
CO2: 32 mmol/L (ref 22–32)
Calcium: 9.2 mg/dL (ref 8.9–10.3)
Chloride: 96 mmol/L — ABNORMAL LOW (ref 98–111)
Creatinine, Ser: 0.94 mg/dL (ref 0.61–1.24)
GFR calc Af Amer: >60 mL/min (ref >60)
GFR calc non Af Amer: >60 mL/min (ref >60)
Glucose, Bld: 97 mg/dL (ref 70–99)
Potassium: 4.1 mmol/L (ref 3.5–5.1)
Sodium: 137 mmol/L (ref 135–145)
Total Bilirubin: 1.1 mg/dL (ref 0.3–1.2)
Total Protein: 7.7 g/dL (ref 6.5–8.1)

## 2018-12-13 LAB — TSH: TSH: 0.23 u[IU]/mL — ABNORMAL LOW (ref 0.350–4.500)

## 2018-12-13 MED ORDER — IOHEXOL 300 MG/ML  SOLN
75.0000 mL | Freq: Once | INTRAMUSCULAR | Status: AC | PRN
  Administered 2018-12-13: 75 mL via INTRAVENOUS

## 2018-12-19 ENCOUNTER — Ambulatory Visit (HOSPITAL_COMMUNITY): Payer: PPO | Admitting: Hematology

## 2018-12-19 ENCOUNTER — Encounter (HOSPITAL_COMMUNITY): Payer: Self-pay | Admitting: Hematology

## 2018-12-19 ENCOUNTER — Other Ambulatory Visit: Payer: Self-pay

## 2018-12-19 ENCOUNTER — Inpatient Hospital Stay (HOSPITAL_BASED_OUTPATIENT_CLINIC_OR_DEPARTMENT_OTHER): Payer: PPO | Admitting: Hematology

## 2018-12-19 VITALS — BP 176/72 | HR 77 | Temp 97.3°F | Resp 20 | Wt 172.4 lb

## 2018-12-19 DIAGNOSIS — C3490 Malignant neoplasm of unspecified part of unspecified bronchus or lung: Secondary | ICD-10-CM | POA: Diagnosis not present

## 2018-12-19 DIAGNOSIS — Z0000 Encounter for general adult medical examination without abnormal findings: Secondary | ICD-10-CM | POA: Diagnosis not present

## 2018-12-19 DIAGNOSIS — C3491 Malignant neoplasm of unspecified part of right bronchus or lung: Secondary | ICD-10-CM | POA: Diagnosis not present

## 2018-12-19 DIAGNOSIS — C3411 Malignant neoplasm of upper lobe, right bronchus or lung: Secondary | ICD-10-CM | POA: Diagnosis not present

## 2018-12-19 MED ORDER — INFLUENZA VAC A&B SA ADJ QUAD 0.5 ML IM PRSY
0.5000 mL | PREFILLED_SYRINGE | Freq: Once | INTRAMUSCULAR | Status: AC
  Administered 2018-12-19: 0.5 mL via INTRAMUSCULAR
  Filled 2018-12-19: qty 0.5

## 2018-12-19 NOTE — Patient Instructions (Signed)
[place] [facility] Discharge Instructions  You were seen today by Dr. Delton Coombes. He went over your recent lab and scan results. He will see you back in 6 months for labs, scan and follow up.   Thank you for choosing [place] at [facility] to provide your oncology and hematology care.  To afford each patient quality time with our provider, please arrive at least 15 minutes before your scheduled appointment time.   If you have a lab appointment with the [facility] please come in thru the  Main Entrance and check in at the main information desk  You need to re-schedule your appointment should you arrive 10 or more minutes late.  We strive to give you quality time with our providers, and arriving late affects you and other patients whose appointments are after yours.  Also, if you no show three or more times for appointments you may be dismissed from the clinic at the providers discretion.     Again, thank you for choosing [facility].  Our hope is that these requests will decrease the amount of time that you wait before being seen by our physicians.       _____________________________________________________________  Should you have questions after your visit to [facility], please contact our office at [phone] between the hours of 8:00 a.m. and 4:30 p.m.  Voicemails left after 4:00 p.m. will not be returned until the following business day.  For prescription refill requests, have your pharmacy contact our office and allow 72 hours.    Cancer Center Support Programs:   > Cancer Support Group  2nd Tuesday of the month 1pm-2pm, Journey Room

## 2018-12-19 NOTE — Progress Notes (Signed)
[place], [place] [address]   CLINIC:  Medical Oncology/Hematology  PCP:  Sharilyn Sites, [place] [address] [phone]   REASON FOR VISIT: Follow-up for lung cancer.  CURRENT THERAPY:  Close observation.  BRIEF ONCOLOGIC HISTORY:  Oncology History  Carcinoma, lung ([place])  06/05/2017 Initial Diagnosis   Carcinoma, lung ([place])   06/05/2017 - 07/18/2017 Chemotherapy   The patient had palonosetron (ALOXI) injection 0.25 mg, 0.25 mg, Intravenous,  Once, 6 of 6 cycles Administration: 0.25 mg (06/11/2017), 0.25 mg (07/09/2017), 0.25 mg (07/16/2017), 0.25 mg (06/18/2017), 0.25 mg (06/25/2017), 0.25 mg (07/02/2017) CARBOplatin (PARAPLATIN) 190 mg in sodium chloride 0.9 % 250 mL chemo infusion, 190 mg (100 % of original dose 192.8 mg), Intravenous,  Once, 6 of 6 cycles Dose modification:   (original dose 192.8 mg, Cycle 1),   (original dose 192.8 mg, Cycle 5), 144.6 mg (original dose 144.6 mg, Cycle 6),   (original dose 192.8 mg, Cycle 2),   (original dose 192.8 mg, Cycle 3),   (original dose 192.8 mg, Cycle 4) Administration: 190 mg (06/11/2017), 190 mg (07/09/2017), 140 mg (07/16/2017), 190 mg (06/18/2017), 190 mg (06/25/2017), 190 mg (07/02/2017) PACLitaxel (TAXOL) 90 mg in dextrose 5 % 250 mL chemo infusion (</= 25m/m2), 45 mg/m2 = 90 mg, Intravenous,  Once, 6 of 6 cycles Dose modification: 40.5 mg/m2 (90 % of original dose 45 mg/m2, Cycle 5, Reason: Other (see comments), Comment: early neuropathy), 36 mg/m2 (80 % of original dose 45 mg/m2, Cycle 6, Reason: Provider Judgment) Administration: 90 mg (06/11/2017), 78 mg (07/09/2017), 72 mg (07/16/2017), 90 mg (06/18/2017), 78 mg (06/25/2017), 78 mg (07/02/2017)  for chemotherapy treatment.    Malignant ne[place] of [place]us and lung ([place]  06/08/2017 Initial Diagnosis   Malignant ne[place] of [place]us and lung ([place]   09/20/2017 -  Chemotherapy   The patient had durvalumab (IMFINZI) 860 mg in sodium  chloride 0.9 % 100 mL chemo infusion, 800 mg, Intravenous,  Once, 26 of 26 cycles Administration: 860 mg (09/20/2017), 860 mg (10/04/2017), 860 mg (10/18/2017), 860 mg (11/01/2017), 860 mg (11/15/2017), 860 mg (11/29/2017), 860 mg (12/13/2017), 860 mg (12/27/2017), 860 mg (01/10/2018), 860 mg (01/28/2018), 860 mg (02/11/2018), 860 mg (02/25/2018), 860 mg (03/11/2018), 860 mg (03/25/2018), 860 mg (04/08/2018), 860 mg (04/22/2018), 860 mg (05/08/2018), 860 mg (05/27/2018), 860 mg (06/10/2018), 860 mg (06/25/2018), 860 mg (07/09/2018), 860 mg (07/23/2018), 860 mg (08/06/2018), 860 mg (08/20/2018), 860 mg (09/03/2018), 860 mg (09/17/2018)  for chemotherapy treatment.       INTERVAL HISTORY:  [name]89y.o. male seen for follow-up of lung cancer.  Appetite is 100%.  Energy levels are 75%.  Shortness of breath on exertion is stable.  No new pains reported.  Numbness in the feet has been stable.  Denies any recent respiratory infections.  No new cough or hemoptysis reported.  No headaches or vision changes.     REVIEW OF SYSTEMS:  Review of Systems  Respiratory: Positive for shortness of breath.   Neurological: Positive for numbness.  All other systems reviewed and are negative.    PAST MEDICAL/SURGICAL HISTORY:  Past Medical History:  Diagnosis Date  . Anxiety   . Arthritis   . Cancer ([place]   . [place]osis ([place]    confirmed by MRI on 07/04/11.    .Marland KitchenCOPD (chronic obstructive pulmonary disease) ([place]   . Depression with anxiety   . ETOH abuse   . Hyperlipidemia   . Nodule of  upper lobe of right lung    with mediastinal adenopathy  . Wears dentures   . Wears glasses    Past Surgical History:  Procedure Laterality Date  . bilateral cataract surgery     [place]  . broken arm  6 yrs ago   left otif of wrist-[place]  . CLOSED REDUCTION WRIST FRACTURE Right 09/02/2015   Procedure: RIGHT WRIST REDUCTION;  Surgeon: Renette Butters, MD;  Location: [place] [facility];  Service: Orthopedics;  Laterality: Right;  with MAC   . COLONOSCOPY  1996   [facility]hman: external hemorrhoids, no polyps  . COLONOSCOPY  06/28/2011   Dr. Ala Bent diverticulosis,tubular adenoma, hyperplastic polyp  . COLONOSCOPY WITH PROPOFOL N/A 04/26/2017   Dr. Gala Romney: perianal and digital rectal examinations normal, diffusely congested colonic mucosa but otherwise normal  . ESOPHAGOGASTRODUODENOSCOPY  06/28/2011   Dr. Chelsea Aus erosive reflux esophagitis, hiatal hernia-gastritis  . ESOPHAGOGASTRODUODENOSCOPY (EGD) WITH PROPOFOL N/A 04/26/2017   Dr. Gala Romney: normal esophagus, small hiatal hernia, GAVE, portal hypertensive gastropathy, normal duodenal bulb and second portion, no specimens collected. 2 years screening   . EXTERNAL EAR SURGERY     right ear-cleaned out ear and created new eardrum  . INGUINAL HERNIA REPAIR  2-3 yrs ago   left-[place]-[facility]_  . INTRAMEDULLARY (IM) NAIL INTERTROCHANTERIC Right 09/02/2015   Procedure: RIGHT  INTERTROCHANTRIC HIP;  Surgeon: Renette Butters, MD;  Location: [facility];  Service: Orthopedics;  Laterality: Right;  With MAC  . KNEE SURGERY     left-arthroscopy-[place]  . PORTACATH PLACEMENT Left 06/08/2017   Procedure: INSERTION POWER PORT WITH  ATTACHED CATHETER LEFT SUBCLAVIAN;  Surgeon: Aviva Signs, MD;  [facility];  Service: General;  Laterality: Left;  . SKULL FRACTURE ELEVATION     fractured cheeck bone repaired  . TOTAL HIP ARTHROPLASTY  12/18/2011   Procedure: TOTAL HIP ARTHROPLASTY;  Surgeon: Carole Civil, MD;  [facility]: [facility];  Service: Orthopedics;  Laterality: Left;  Marland Kitchen VIDEO BRONCHOSCOPY WITH ENDOBRONCHIAL ULTRASOUND N/A 05/04/2017   Procedure: VIDEO BRONCHOSCOPY WITH ENDOBRONCHIAL ULTRASOUND;  Surgeon: Melrose Nakayama, MD;  Location: [facility];  Service: Thoracic;  Laterality: N/A;  . WISDOM TOOTH EXTRACTION       SOCIAL HISTORY:  Social History   Socioeconomic History  . Marital status: Married    Spouse name: Not on file  . Number of children: Not on file  . Years of education:  Not on file  . Highest education level: Not on file  Occupational History  . Occupation: retired    Fish farm manager: [employer]  Social Needs  . Financial resource strain: Not on file  . Food insecurity    Worry: Not on file    Inability: Not on file  . Transportation needs    Medical: Not on file    Non-medical: Not on file  Tobacco Use  . Smoking status: Current Every Day Smoker    Packs/day: 0.50    Years: 62.00    Pack years: 31.00    Types: Cigarettes  . Smokeless tobacco: Never Used  . Tobacco comment: 1/2 ppd > 60 years as of 06/27/17: 4-5 cigarettes or less a day  Substance and Sexual Activity  . Alcohol use: Not Currently    Comment: stopped 4 years ago  . Drug use: No  . Sexual activity: Yes    Birth control/protection: None  Lifestyle  . Physical activity    Days per week: Not on file    Minutes per session: Not on file  .  Stress: Not on file  Relationships  . Social Herbalist on phone: Not on file    Gets together: Not on file    Attends religious service: Not on file    Active member of club or organization: Not on file    Attends meetings of clubs or organizations: Not on file    Relationship status: Not on file  . Intimate partner violence    Fear of current or ex partner: Not on file    Emotionally abused: Not on file    Physically abused: Not on file    Forced sexual activity: Not on file  Other Topics Concern  . Not on file  Social History Narrative  . Not on file    FAMILY HISTORY:  Family History  Problem Relation Age of Onset  . Stroke Mother 43  . Heart disease Father 66       MI  . Diabetes Maternal Uncle   . Colon cancer Neg Hx   . Cancer Neg Hx     CURRENT MEDICATIONS:  Outpatient Encounter Medications as of 12/19/2018  Medication Sig  . cholecalciferol (VITAMIN D) 1000 units tablet Take 1,000 Units by mouth daily.  Marland Kitchen levothyroxine (SYNTHROID) 100 MCG tablet TAKE 1 TABLET(100 MCG) BY MOUTH DAILY BEFORE  BREAKFAST  . SPIRIVA HANDIHALER 18 MCG inhalation capsule INHALE CONTENTS OF 1 CAPSULE VIA HANDHALER QD  . guaiFENesin (MUCINEX PO) Take by mouth 2 (two) times daily.  . meclizine (ANTIVERT) 25 MG tablet TK 1 T PO  Q 6 H PRN  . PROAIR HFA 108 (90 Base) MCG/ACT inhaler Inhale 2 puffs into the lungs every 6 (six) hours as needed (for shortness of breath/wheezing/cough).   . Propylene Glycol-Glycerin (SOOTHE) 0.6-0.6 % SOLN Place 1-2 drops into both eyes 3 (three) times daily as needed (for dry/irritated eyes.).  . [DISCONTINUED] Nutritional Supplements (ENSURE ENLIVE PO) Take 237 mLs by mouth as needed (for poor intake).   . [EXPIRED] influenza vaccine adjuvanted (FLUAD) injection 0.5 mL    No facility-administered encounter medications on file as of 12/19/2018.     ALLERGIES:  No Known Allergies   PHYSICAL EXAM:  ECOG Performance status: 1  Vitals:   12/19/18 1130  BP: (!) 176/72  Pulse: 77  Resp: 20  Temp: (!) 97.3 F (36.3 C)  SpO2: 92%   Filed Weights   12/19/18 1130  Weight: 172 lb 6.4 oz (78.2 kg)    Physical Exam Constitutional:      Appearance: Normal appearance. He is normal weight.  Cardiovascular:     Rate and Rhythm: Normal rate and regular rhythm.     Heart sounds: Normal heart sounds.  Pulmonary:     Effort: Pulmonary effort is normal.     Breath sounds: Normal breath sounds.  Abdominal:     General: Bowel sounds are normal.     Palpations: Abdomen is soft.  Musculoskeletal: Normal range of motion.  Skin:    General: Skin is warm and dry.  Neurological:     Mental Status: He is alert and oriented to person, place, and time. Mental status is at baseline.  Psychiatric:        Mood and Affect: Mood normal.        Behavior: Behavior normal.        Thought Content: Thought content normal.        Judgment: Judgment normal.      LABORATORY DATA:  I have reviewed the labs as listed.  CBC    Component Value Date/Time   WBC 4.2 12/13/2018 1204   RBC  4.60 12/13/2018 1204   HGB 14.3 12/13/2018 1204   HCT 45.0 12/13/2018 1204   PLT 187 12/13/2018 1204   MCV 97.8 12/13/2018 1204   MCH 31.1 12/13/2018 1204   MCHC 31.8 12/13/2018 1204   RDW 14.9 12/13/2018 1204   LYMPHSABS 0.9 12/13/2018 1204   MONOABS 0.4 12/13/2018 1204   EOSABS 0.1 12/13/2018 1204   BASOSABS 0.0 12/13/2018 1204   CMP Latest Ref Rng & Units 12/13/2018 10/16/2018 09/16/2018  Glucose 70 - 99 mg/dL 97 108(H) 113(H)  BUN 8 - 23 mg/dL 16 16 26(H)  Creatinine 0.61 - 1.24 mg/dL 0.94 0.83 0.96  Sodium 135 - 145 mmol/L 137 140 144  Potassium 3.5 - 5.1 mmol/L 4.1 3.8 3.9  Chloride 98 - 111 mmol/L 96(L) 100 106  CO2 22 - 32 mmol/L 32 31 30  Calcium 8.9 - 10.3 mg/dL 9.2 9.1 9.2  Total Protein 6.5 - 8.1 g/dL 7.7 7.1 7.1  Total Bilirubin 0.3 - 1.2 mg/dL 1.1 0.8 0.9  Alkaline Phos 38 - 126 U/L 84 88 77  AST 15 - 41 U/L _0 ALT 0 - 44 U/L _1 DIAGNOSTIC IMAGING:  I have independently reviewed the scans and discussed with the patient.    ASSESSMENT & PLAN:   Malignant neoplasm of bronchus and lung ([place]) 1.  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  Small focus of moderately increased uptake in the L4 transverse vertebral process, cannot rule out metastasis. -Chemoradiation therapy with carboplatin and paclitaxel weekly from 06/11/2017 through 07/17/2018. - Durvalumab consolidation from 09/20/2017 through 09/17/2018. -He denies any change in his cough or hemoptysis. -Reviewed CT scan from 12/13/2018 which showed unchanged posttreatment appearance of the paramedian right upper lobe, including a nodule measuring 0.9 x 0.7 cm.  Hepatic cirrhosis present. -I will switch his visit to 6 months with repeat CT scan of the chest.  2.  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  No intervention needed.  3.   Hypothyroidism: -TSH is 0.230.  We will continue Synthroid.     Orders placed this encounter:  Orders Placed This Encounter  Procedures  . CT Chest W Contrast  . CBC with Differential/Platelet  . Comprehensive metabolic panel      Derek Jack, MD [place] [phone]

## 2018-12-27 ENCOUNTER — Encounter (HOSPITAL_COMMUNITY): Payer: Self-pay | Admitting: Hematology

## 2018-12-27 NOTE — Assessment & Plan Note (Addendum)
1.  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  Small focus of moderately increased uptake in the L4 transverse vertebral process, cannot rule out metastasis. -Chemoradiation therapy with carboplatin and paclitaxel weekly from 06/11/2017 through 07/17/2018. - Durvalumab consolidation from 09/20/2017 through 09/17/2018. -He denies any change in his cough or hemoptysis. -Reviewed CT scan from 12/13/2018 which showed unchanged posttreatment appearance of the paramedian right upper lobe, including a nodule measuring 0.9 x 0.7 cm.  Hepatic cirrhosis present. -I will switch his visit to 6 months with repeat CT scan of the chest.  2.  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  No intervention needed.  3.  Hypothyroidism: -TSH is 0.230.  We will continue Synthroid.

## 2019-01-07 DIAGNOSIS — J449 Chronic obstructive pulmonary disease, unspecified: Secondary | ICD-10-CM | POA: Diagnosis not present

## 2019-02-06 DIAGNOSIS — J449 Chronic obstructive pulmonary disease, unspecified: Secondary | ICD-10-CM | POA: Diagnosis not present

## 2019-03-09 DIAGNOSIS — J449 Chronic obstructive pulmonary disease, unspecified: Secondary | ICD-10-CM | POA: Diagnosis not present

## 2019-04-07 NOTE — Progress Notes (Signed)
Referring Provider: Sharilyn Sites, MD Primary Care Physician:  Sharilyn Sites, MD Primary GI: Dr. Gala Romney   Chief Complaint  Patient presents with  . Follow-up    fu cirrhosis    HPI:   Ian Aguilar is a 79 y.o. male presenting today with a history of cirrhosis presumably related to ETOH abuse.Followed by Oncology due to right upper lobe lung carcinoma. 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 He has had multiple CTs but only one MRI in past. On CT, the cystic lesion has remained stable for approximately 7 years. Needs surveillance MRI next April and would recommend every 2 years MRI. EGD due now for routine screening . Hep A/B vaccinations 2 of 3 doses completed. Needs 3rd dose.   US abdomen on file from Oct 2020. Due for April 2021 [place]. No abdominal pain, constipation, or diarrhea. Biggest problem is breathing. Gets short of breath with exertion. No rectal bleeding. Good appetite. Eating everything in front of him. No jaundice. No itching. No N/V. No overt GI bleeding.    Past Medical History:  Diagnosis Date  . Anxiety   . Arthritis   . Cancer ([place])   . Cirrhosis ([place])    confirmed by MRI on 07/04/11.    Marland Kitchen COPD (chronic obstructive pulmonary disease) ([place])   . Depression with anxiety   . ETOH abuse   . Hyperlipidemia   . Nodule of upper lobe of right lung    with mediastinal adenopathy  . Wears dentures   . Wears glasses     Past Surgical History:  Procedure Laterality Date  . bilateral cataract surgery     [place]  . broken arm  6 yrs ago   left otif of wrist-[place]  . CLOSED REDUCTION WRIST FRACTURE Right 09/02/2015   Procedure: RIGHT WRIST REDUCTION;  Surgeon: Renette Butters, MD;  Location: [place];  Service: Orthopedics;  Laterality: Right;  with MAC  . COLONOSCOPY  1996   [place]: external hemorrhoids, no polyps  . COLONOSCOPY  06/28/2011   Dr. Ala Bent diverticulosis,tubular adenoma, hyperplastic polyp  . COLONOSCOPY WITH PROPOFOL N/A  04/26/2017   Dr. Gala Romney: perianal and digital rectal examinations normal, diffusely congested colonic mucosa but otherwise normal  . ESOPHAGOGASTRODUODENOSCOPY  06/28/2011   Dr. Chelsea Aus erosive reflux esophagitis, hiatal hernia-gastritis  . ESOPHAGOGASTRODUODENOSCOPY (EGD) WITH PROPOFOL N/A 04/26/2017   Dr. Gala Romney: normal esophagus, small hiatal hernia, GAVE, portal hypertensive gastropathy, normal duodenal bulb and second portion, no specimens collected. 2 years screening   . EXTERNAL EAR SURGERY     right ear-cleaned out ear and created new eardrum  . INGUINAL HERNIA REPAIR  2-3 yrs ago   left-[place]-[facility]  . INTRAMEDULLARY (IM) NAIL INTERTROCHANTERIC Right 09/02/2015   Procedure: RIGHT  INTERTROCHANTRIC HIP;  Surgeon: Renette Butters, MD;  Location: [facility];  Service: Orthopedics;  Laterality: Right;  With MAC  . KNEE SURGERY     left-arthroscopy-[place]  . PORTACATH PLACEMENT Left 06/08/2017   Procedure: INSERTION POWER PORT WITH  ATTACHED CATHETER LEFT SUBCLAVIAN;  Surgeon: Aviva Signs, MD;  [facility];  Service: General;  Laterality: Left;  . SKULL FRACTURE ELEVATION     fractured cheeck bone repaired  . TOTAL HIP ARTHROPLASTY  12/18/2011   Procedure: TOTAL HIP ARTHROPLASTY;  Surgeon: Carole Civil, MD;  [facility];  Service: Orthopedics;  Laterality: Left;  Marland Kitchen VIDEO BRONCHOSCOPY WITH ENDOBRONCHIAL ULTRASOUND N/A 05/04/2017   Procedure: VIDEO BRONCHOSCOPY WITH ENDOBRONCHIAL ULTRASOUND;  Surgeon: Melrose Nakayama, MD;  [facility];  Service: Thoracic;  Laterality: N/A;  . WISDOM TOOTH EXTRACTION      Current Outpatient Medications  Medication Sig Dispense Refill  . cholecalciferol (VITAMIN D) 1000 units tablet Take 1,000 Units by mouth daily.    Marland Kitchen guaiFENesin (MUCINEX PO) Take by mouth as needed.     Marland Kitchen levothyroxine (SYNTHROID) 100 MCG tablet TAKE 1 TABLET(100 MCG) BY MOUTH DAILY BEFORE BREAKFAST 90 tablet 1  . meclizine (ANTIVERT) 25 MG tablet as needed.   0  .  Propylene Glycol-Glycerin (SOOTHE) 0.6-0.6 % SOLN Place 1-2 drops into both eyes as needed (for dry/irritated eyes.).     Marland Kitchen SPIRIVA HANDIHALER 18 MCG inhalation capsule INHALE CONTENTS OF 1 CAPSULE VIA HANDHALER QD  10  . PROAIR HFA 108 (90 Base) MCG/ACT inhaler Inhale 2 puffs into the lungs every 6 (six) hours as needed (for shortness of breath/wheezing/cough).   2   No current facility-administered medications for this visit.    Allergies as of 04/08/2019  . (No Known Allergies)    Family History  Problem Relation Age of Onset  . Stroke Mother 35  . Heart disease Father 91       MI  . Diabetes Maternal Uncle   . Colon cancer Neg Hx   . Cancer Neg Hx     Social History   Socioeconomic History  . Marital status: Married    Spouse name: Not on file  . Number of children: Not on file  . Years of education: Not on file  . Highest education level: Not on file  Occupational History  . Occupation: retired    Fish farm manager: [employer]  Tobacco Use  . Smoking status: Current Every Day Smoker    Packs/day: 0.25    Years: 62.00    Pack years: 15.50    Types: Cigarettes  . Smokeless tobacco: Never Used  . Tobacco comment: 1/2 ppd > 60 years as of 06/27/17: 4-5 cigarettes or less a day  Substance and Sexual Activity  . Alcohol use: Not Currently    Comment: stopped 4 years ago  . Drug use: No  . Sexual activity: Yes    Birth control/protection: None  Other Topics Concern  . Not on file  Social History Narrative  . Not on file   Social Determinants of Health   Financial Resource Strain:   . Difficulty of Paying Living Expenses: Not on file  Food Insecurity:   . Worried About Charity fundraiser in the Last Year: Not on file  . Ran Out of Food in the Last Year: Not on file  Transportation Needs:   . Lack of Transportation (Medical): Not on file  . Lack of Transportation (Non-Medical): Not on file  Physical Activity:   . Days of Exercise per Week: Not on file  .  Minutes of Exercise per Session: Not on file  Stress:   . Feeling of Stress : Not on file  Social Connections:   . Frequency of Communication with Friends and Family: Not on file  . Frequency of Social Gatherings with Friends and Family: Not on file  . Attends Religious Services: Not on file  . Active Member of Clubs or Organizations: Not on file  . Attends Archivist Meetings: Not on file  . Marital Status: Not on file    Review of Systems: Gen: Denies fever, chills, anorexia. Denies fatigue, weakness, weight loss.  CV: Denies chest pain,  palpitations, syncope, peripheral edema, and claudication. Resp: see HPI GI: Denies vomiting blood, jaundice, and fecal incontinence.   Denies dysphagia or odynophagia. Derm: Denies rash, itching, dry skin Psych: Denies depression, anxiety, memory loss, confusion. No homicidal or suicidal ideation.  Heme: Denies bruising, bleeding, and enlarged lymph nodes.  Physical Exam: BP (!) 170/90   Pulse 98   Temp 97.7 F (36.5 C) (Temporal)   Ht 6' (1.829 m)   Wt 178 lb 6.4 oz (80.9 kg)   BMI 24.20 kg/m  General:   Alert and oriented. No distress noted. Pleasant and cooperative.  Head:  Normocephalic and atraumatic. Eyes:  Conjuctiva clear without scleral icterus. Lungs: clear bilaterally Cardiac: S1 S2 present Abdomen:  +BS, soft, non-tender and non-distended. Hepatomegaly with liver margin easily palpable. Small umbilical hernia. No rebound or guarding.  Msk:  Symmetrical without gross deformities. Normal posture. Extremities:  Without edema. Neurologic:  Alert and  oriented x4 Psych:  Alert and cooperative. Normal mood and affect.  ASSESSMENT: DAVED MCFANN is a 79 y.o. male presenting today with a history of cirrhosis likely secondary to ETOH in the past, history of pancreatic cysts in surveillance mode, due now for screening EGD as last was in 2019. Overall, well-compensated.   Cirrhosis: EGD to be arranged in near future. US  abdomen due in April 2021. Will request INR, AFP for baseline to be completed with next blood draw (with Oncology labs upcoming).   Pancreatic cysts: stable for 7 years. Last MRI in past but none recent. Multiple CTs on file. Would favor MRI in April 2022 and surveillance every 2 years.    PLAN:   Proceed with upper endoscopy in the near future with Dr. Gala Romney using PROPOFOL. The risks, benefits, and alternatives have been discussed in detail with patient. They have stated understanding and desire to proceed.   US abdomen in April 2021  MRI April 2022 due to history of pancreatic cysts  Return in 6 months. Labs requested when done with next blood draw   Annitta Needs, PhD, [facility] Gastroenterology

## 2019-04-08 ENCOUNTER — Ambulatory Visit (INDEPENDENT_AMBULATORY_CARE_PROVIDER_SITE_OTHER): Payer: PPO | Admitting: Gastroenterology

## 2019-04-08 ENCOUNTER — Encounter: Payer: Self-pay | Admitting: Internal Medicine

## 2019-04-08 ENCOUNTER — Encounter: Payer: Self-pay | Admitting: Gastroenterology

## 2019-04-08 ENCOUNTER — Other Ambulatory Visit: Payer: Self-pay

## 2019-04-08 VITALS — BP 170/90 | HR 98 | Temp 97.7°F | Ht 72.0 in | Wt 178.4 lb

## 2019-04-08 DIAGNOSIS — K7469 Other cirrhosis of liver: Secondary | ICD-10-CM

## 2019-04-08 NOTE — Progress Notes (Unsigned)
u

## 2019-04-08 NOTE — Patient Instructions (Addendum)
We are arranging an upper endoscopy with Dr. Gala Romney in the near future.  Your next ultrasound will be in April 2021.  You can go ahead and complete the vaccinations.   I have ordered labs that you can get done in April when you complete labs for the Oncologist.  We will see you in 6 months!   I enjoyed seeing you again today! As you know, I value our relationship and want to provide genuine, compassionate, and quality care. I welcome your feedback. If you receive a survey regarding your visit,  I greatly appreciate you taking time to fill this out. See you next time!  Annitta Needs, PhD, ANP-BC [facility] Gastroenterology

## 2019-04-09 ENCOUNTER — Encounter: Payer: Self-pay | Admitting: Gastroenterology

## 2019-04-09 DIAGNOSIS — J449 Chronic obstructive pulmonary disease, unspecified: Secondary | ICD-10-CM | POA: Diagnosis not present

## 2019-04-09 NOTE — Progress Notes (Signed)
[facility] clinical pool: Please NIC MRI pancreas for April 2022 for cyst surveillance.

## 2019-04-10 NOTE — Progress Notes (Signed)
Ian Aguilar, please NIC.

## 2019-04-14 NOTE — Progress Notes (Signed)
Noted  

## 2019-04-14 NOTE — Progress Notes (Signed)
ON RECALL  °

## 2019-04-23 ENCOUNTER — Other Ambulatory Visit (HOSPITAL_COMMUNITY): Payer: Self-pay | Admitting: *Deleted

## 2019-04-23 DIAGNOSIS — C3491 Malignant neoplasm of unspecified part of right bronchus or lung: Secondary | ICD-10-CM

## 2019-04-23 MED ORDER — LEVOTHYROXINE SODIUM 100 MCG PO TABS: ORAL_TABLET | ORAL | 1 refills | Status: DC

## 2019-05-07 DIAGNOSIS — J449 Chronic obstructive pulmonary disease, unspecified: Secondary | ICD-10-CM | POA: Diagnosis not present

## 2019-05-12 ENCOUNTER — Telehealth: Payer: Self-pay | Admitting: Internal Medicine

## 2019-05-12 NOTE — Telephone Encounter (Signed)
Recall for abd ultrasound °

## 2019-05-12 NOTE — Telephone Encounter (Signed)
Pt already scheduled for [place] abd 06/02/19.

## 2019-05-30 ENCOUNTER — Other Ambulatory Visit (HOSPITAL_COMMUNITY): Payer: Self-pay | Admitting: Nurse Practitioner

## 2019-05-30 DIAGNOSIS — C3491 Malignant neoplasm of unspecified part of right bronchus or lung: Secondary | ICD-10-CM

## 2019-06-02 ENCOUNTER — Ambulatory Visit (HOSPITAL_COMMUNITY)
Admission: RE | Admit: 2019-06-02 | Discharge: 2019-06-02 | Disposition: A | Discharge status: Home / Self Care | Payer: PPO | Source: Ambulatory Visit | Attending: Gastroenterology | Admitting: Gastroenterology

## 2019-06-02 ENCOUNTER — Other Ambulatory Visit: Payer: Self-pay

## 2019-06-02 DIAGNOSIS — K7460 Unspecified cirrhosis of liver: Secondary | ICD-10-CM | POA: Diagnosis not present

## 2019-06-02 DIAGNOSIS — K7469 Other cirrhosis of liver: Secondary | ICD-10-CM | POA: Diagnosis not present

## 2019-06-07 DIAGNOSIS — J449 Chronic obstructive pulmonary disease, unspecified: Secondary | ICD-10-CM | POA: Diagnosis not present

## 2019-06-09 DIAGNOSIS — Z658 Other specified problems related to psychosocial circumstances: Secondary | ICD-10-CM | POA: Diagnosis not present

## 2019-06-09 DIAGNOSIS — Z719 Counseling, unspecified: Secondary | ICD-10-CM | POA: Diagnosis not present

## 2019-06-09 DIAGNOSIS — Z6824 Body mass index (BMI) 24.0-24.9, adult: Secondary | ICD-10-CM | POA: Diagnosis not present

## 2019-06-09 DIAGNOSIS — Z1389 Encounter for screening for other disorder: Secondary | ICD-10-CM | POA: Diagnosis not present

## 2019-06-09 DIAGNOSIS — J449 Chronic obstructive pulmonary disease, unspecified: Secondary | ICD-10-CM | POA: Diagnosis not present

## 2019-06-09 DIAGNOSIS — C3490 Malignant neoplasm of unspecified part of unspecified bronchus or lung: Secondary | ICD-10-CM | POA: Diagnosis not present

## 2019-06-09 DIAGNOSIS — Z0001 Encounter for general adult medical examination with abnormal findings: Secondary | ICD-10-CM | POA: Diagnosis not present

## 2019-06-09 DIAGNOSIS — F17200 Nicotine dependence, unspecified, uncomplicated: Secondary | ICD-10-CM | POA: Diagnosis not present

## 2019-06-09 DIAGNOSIS — R0902 Hypoxemia: Secondary | ICD-10-CM | POA: Diagnosis not present

## 2019-06-09 DIAGNOSIS — F419 Anxiety disorder, unspecified: Secondary | ICD-10-CM | POA: Diagnosis not present

## 2019-06-09 DIAGNOSIS — M169 Osteoarthritis of hip, unspecified: Secondary | ICD-10-CM | POA: Diagnosis not present

## 2019-06-16 ENCOUNTER — Encounter (HOSPITAL_COMMUNITY): Payer: Self-pay

## 2019-06-16 ENCOUNTER — Encounter (HOSPITAL_COMMUNITY)
Admission: RE | Admit: 2019-06-16 | Discharge: 2019-06-16 | Disposition: A | Discharge status: Home / Self Care | Payer: PPO | Source: Ambulatory Visit | Attending: Internal Medicine | Admitting: Internal Medicine

## 2019-06-16 ENCOUNTER — Ambulatory Visit (HOSPITAL_COMMUNITY)
Admission: RE | Admit: 2019-06-16 | Discharge: 2019-06-16 | Disposition: A | Discharge status: Home / Self Care | Payer: PPO | Source: Ambulatory Visit | Attending: Hematology | Admitting: Hematology

## 2019-06-16 ENCOUNTER — Other Ambulatory Visit (HOSPITAL_COMMUNITY)
Admission: RE | Admit: 2019-06-16 | Discharge: 2019-06-16 | Disposition: A | Discharge status: Home / Self Care | Payer: PPO | Source: Ambulatory Visit | Attending: Internal Medicine | Admitting: Internal Medicine

## 2019-06-16 ENCOUNTER — Other Ambulatory Visit: Payer: Self-pay

## 2019-06-16 ENCOUNTER — Inpatient Hospital Stay (HOSPITAL_COMMUNITY): Payer: PPO | Attending: Hematology

## 2019-06-16 DIAGNOSIS — E039 Hypothyroidism, unspecified: Secondary | ICD-10-CM | POA: Insufficient documentation

## 2019-06-16 DIAGNOSIS — I10 Essential (primary) hypertension: Secondary | ICD-10-CM | POA: Insufficient documentation

## 2019-06-16 DIAGNOSIS — C3411 Malignant neoplasm of upper lobe, right bronchus or lung: Secondary | ICD-10-CM | POA: Insufficient documentation

## 2019-06-16 DIAGNOSIS — Z5111 Encounter for antineoplastic chemotherapy: Secondary | ICD-10-CM | POA: Diagnosis not present

## 2019-06-16 DIAGNOSIS — G629 Polyneuropathy, unspecified: Secondary | ICD-10-CM | POA: Diagnosis not present

## 2019-06-16 DIAGNOSIS — J449 Chronic obstructive pulmonary disease, unspecified: Secondary | ICD-10-CM | POA: Insufficient documentation

## 2019-06-16 DIAGNOSIS — Z01818 Encounter for other preprocedural examination: Secondary | ICD-10-CM | POA: Insufficient documentation

## 2019-06-16 DIAGNOSIS — C3491 Malignant neoplasm of unspecified part of right bronchus or lung: Secondary | ICD-10-CM

## 2019-06-16 DIAGNOSIS — Z79899 Other long term (current) drug therapy: Secondary | ICD-10-CM | POA: Insufficient documentation

## 2019-06-16 DIAGNOSIS — F17210 Nicotine dependence, cigarettes, uncomplicated: Secondary | ICD-10-CM | POA: Insufficient documentation

## 2019-06-16 DIAGNOSIS — Z20822 Contact with and (suspected) exposure to covid-19: Secondary | ICD-10-CM | POA: Insufficient documentation

## 2019-06-16 HISTORY — DX: Essential (primary) hypertension: I10

## 2019-06-16 LAB — CBC WITH DIFFERENTIAL/PLATELET
Abs Immature Granulocytes: 0.02 10*3/uL (ref 0.00–0.07)
Basophils Absolute: 0 10*3/uL (ref 0.0–0.1)
Basophils Relative: 0 %
Eosinophils Absolute: 0.1 10*3/uL (ref 0.0–0.5)
Eosinophils Relative: 1 %
HCT: 42.6 % (ref 39.0–52.0)
Hemoglobin: 13.4 g/dL (ref 13.0–17.0)
Immature Granulocytes: 0 %
Lymphocytes Relative: 16 %
Lymphs Abs: 0.8 10*3/uL (ref 0.7–4.0)
MCH: 31.1 pg (ref 26.0–34.0)
MCHC: 31.5 g/dL (ref 30.0–36.0)
MCV: 98.8 fL (ref 80.0–100.0)
Monocytes Absolute: 0.5 10*3/uL (ref 0.1–1.0)
Monocytes Relative: 9 %
Neutro Abs: 3.6 10*3/uL (ref 1.7–7.7)
Neutrophils Relative %: 74 %
Platelets: 170 10*3/uL (ref 150–400)
RBC: 4.31 MIL/uL (ref 4.22–5.81)
RDW: 14.8 % (ref 11.5–15.5)
WBC: 5 10*3/uL (ref 4.0–10.5)
nRBC: 0 % (ref 0.0–0.2)

## 2019-06-16 LAB — COMPREHENSIVE METABOLIC PANEL
ALT: 14 U/L (ref 0–44)
AST: 22 U/L (ref 15–41)
Albumin: 4 g/dL (ref 3.5–5.0)
Alkaline Phosphatase: 88 U/L (ref 38–126)
Anion gap: 10 (ref 5–15)
BUN: 20 mg/dL (ref 8–23)
CO2: 31 mmol/L (ref 22–32)
Calcium: 9.1 mg/dL (ref 8.9–10.3)
Chloride: 97 mmol/L — ABNORMAL LOW (ref 98–111)
Creatinine, Ser: 1.11 mg/dL (ref 0.61–1.24)
GFR calc Af Amer: >60 mL/min (ref >60)
GFR calc non Af Amer: >60 mL/min (ref >60)
Glucose, Bld: 107 mg/dL — ABNORMAL HIGH (ref 70–99)
Potassium: 4.2 mmol/L (ref 3.5–5.1)
Sodium: 138 mmol/L (ref 135–145)
Total Bilirubin: 0.8 mg/dL (ref 0.3–1.2)
Total Protein: 7.4 g/dL (ref 6.5–8.1)

## 2019-06-16 MED ORDER — IOHEXOL 300 MG/ML  SOLN
75.0000 mL | Freq: Once | INTRAMUSCULAR | Status: AC | PRN
  Administered 2019-06-16: 75 mL via INTRAVENOUS

## 2019-06-16 NOTE — Patient Instructions (Signed)
ZACKARIE CHASON  06/16/2019     @PREFPERIOPPHARMACY @   Your procedure is scheduled on  06/19/2019  Report to [facility] at  1045  A.M.  Call this number if you have problems the morning of surgery:  [phone]   Remember:  Follow the diet instructions given to you by Dr Roseanne Kaufman office.                       Take these medicines the morning of surgery with A SIP OF WATER levothyroxine, claritin, antivert (if needed). Use your inhaler before you come.    Do not wear jewelry, make-up or nail polish.  Do not wear lotions, powders, or perfumes. Please wear deodorant and brush your teeth.  Do not shave 48 hours prior to surgery.  Men may shave face and neck.  Do not bring valuables to the hospital.  [facility] is not responsible for any belongings or valuables.  Contacts, dentures or bridgework may not be worn into surgery.  Leave your suitcase in the car.  After surgery it may be brought to your room.  For patients admitted to the hospital, discharge time will be determined by your treatment team.  Patients discharged the day of surgery will not be allowed to drive home.   Name and phone number of your driver:   family Special instructions:  DO NOT smoke the morning of your procedure.  Please read over the following fact sheets that you were given. Anesthesia Post-op Instructions and Care and Recovery After Surgery       Upper Endoscopy, Adult, Care After This sheet gives you information about how to care for yourself after your procedure. Your health care provider may also give you more specific instructions. If you have problems or questions, contact your health care provider. What can I expect after the procedure? After the procedure, it is common to have:  A sore throat.  Mild stomach pain or discomfort.  Bloating.  Nausea. Follow these instructions at home:   Follow instructions from your health care provider about what to eat or drink after your  procedure.  Return to your normal activities as told by your health care provider. Ask your health care provider what activities are safe for you.  Take over-the-counter and prescription medicines only as told by your health care provider.  Do not drive for 24 hours if you were given a sedative during your procedure.  Keep all follow-up visits as told by your health care provider. This is important. Contact a health care provider if you have:  A sore throat that lasts longer than one day.  Trouble swallowing. Get help right away if:  You vomit blood or your vomit looks like coffee grounds.  You have: ? A fever. ? Bloody, black, or tarry stools. ? A severe sore throat or you cannot swallow. ? Difficulty breathing. ? Severe pain in your chest or abdomen. Summary  After the procedure, it is common to have a sore throat, mild stomach discomfort, bloating, and nausea.  Do not drive for 24 hours if you were given a sedative during the procedure.  Follow instructions from your health care provider about what to eat or drink after your procedure.  Return to your normal activities as told by your health care provider. This information is not intended to replace advice given to you by your health care provider. Make sure you discuss any questions you have  with your health care provider. Document Revised: 08/07/2017 Document Reviewed: 07/16/2017 Elsevier Patient Education  2020 [employer] After These instructions provide you with information about caring for yourself after your procedure. Your health care provider may also give you more specific instructions. Your treatment has been planned according to current medical practices, but problems sometimes occur. Call your health care provider if you have any problems or questions after your procedure. What can I expect after the procedure? After your procedure, you may:  Feel sleepy for several hours.   Feel clumsy and have poor balance for several hours.  Feel forgetful about what happened after the procedure.  Have poor judgment for several hours.  Feel nauseous or vomit.  Have a sore throat if you had a breathing tube during the procedure. Follow these instructions at home: For at least 24 hours after the procedure:      Have a responsible adult stay with you. It is important to have someone help care for you until you are awake and alert.  Rest as needed.  Do not: ? Participate in activities in which you could fall or become injured. ? Drive. ? Use heavy machinery. ? Drink alcohol. ? Take sleeping pills or medicines that cause drowsiness. ? Make important decisions or sign legal documents. ? Take care of children on your own. Eating and drinking  Follow the diet that is recommended by your health care provider.  If you vomit, drink water, juice, or soup when you can drink without vomiting.  Make sure you have little or no nausea before eating solid foods. General instructions  Take over-the-counter and prescription medicines only as told by your health care provider.  If you have sleep apnea, surgery and certain medicines can increase your risk for breathing problems. Follow instructions from your health care provider about wearing your sleep device: ? Anytime you are sleeping, including during daytime naps. ? While taking prescription pain medicines, sleeping medicines, or medicines that make you drowsy.  If you smoke, do not smoke without supervision.  Keep all follow-up visits as told by your health care provider. This is important. Contact a health care provider if:  You keep feeling nauseous or you keep vomiting.  You feel light-headed.  You develop a rash.  You have a fever. Get help right away if:  You have trouble breathing. Summary  For several hours after your procedure, you may feel sleepy and have poor judgment.  Have a responsible adult  stay with you for at least 24 hours or until you are awake and alert. This information is not intended to replace advice given to you by your health care provider. Make sure you discuss any questions you have with your health care provider. Document Revised: 05/14/2017 Document Reviewed: 06/06/2015 Elsevier Patient Education  [place].

## 2019-06-17 ENCOUNTER — Other Ambulatory Visit (HOSPITAL_COMMUNITY): Admission: RE | Admit: 2019-06-17 | Payer: PPO | Source: Ambulatory Visit

## 2019-06-17 ENCOUNTER — Encounter (HOSPITAL_COMMUNITY)
Admission: RE | Admit: 2019-06-17 | Discharge: 2019-06-17 | Disposition: A | Discharge status: Home / Self Care | Payer: PPO | Source: Ambulatory Visit | Attending: Internal Medicine | Admitting: Internal Medicine

## 2019-06-17 LAB — SARS CORONAVIRUS 2 (TAT 6-24 HRS): SARS Coronavirus 2: NEGATIVE

## 2019-06-19 ENCOUNTER — Ambulatory Visit (HOSPITAL_COMMUNITY): Payer: PPO | Admitting: Anesthesiology

## 2019-06-19 ENCOUNTER — Encounter (HOSPITAL_COMMUNITY): Payer: Self-pay | Admitting: Internal Medicine

## 2019-06-19 ENCOUNTER — Ambulatory Visit (HOSPITAL_COMMUNITY)
Admission: RE | Admit: 2019-06-19 | Discharge: 2019-06-19 | Disposition: A | Discharge status: Home / Self Care | Payer: PPO | Attending: Internal Medicine | Admitting: Internal Medicine

## 2019-06-19 ENCOUNTER — Encounter (HOSPITAL_COMMUNITY)
Admission: RE | Disposition: A | Discharge status: Home / Self Care | Payer: Self-pay | Source: Home / Self Care | Attending: Internal Medicine

## 2019-06-19 DIAGNOSIS — E785 Hyperlipidemia, unspecified: Secondary | ICD-10-CM | POA: Insufficient documentation

## 2019-06-19 DIAGNOSIS — Z1389 Encounter for screening for other disorder: Secondary | ICD-10-CM | POA: Diagnosis not present

## 2019-06-19 DIAGNOSIS — F419 Anxiety disorder, unspecified: Secondary | ICD-10-CM | POA: Insufficient documentation

## 2019-06-19 DIAGNOSIS — F17200 Nicotine dependence, unspecified, uncomplicated: Secondary | ICD-10-CM | POA: Insufficient documentation

## 2019-06-19 DIAGNOSIS — K31819 Angiodysplasia of stomach and duodenum without bleeding: Secondary | ICD-10-CM | POA: Diagnosis not present

## 2019-06-19 DIAGNOSIS — Z9842 Cataract extraction status, left eye: Secondary | ICD-10-CM | POA: Insufficient documentation

## 2019-06-19 DIAGNOSIS — M1990 Unspecified osteoarthritis, unspecified site: Secondary | ICD-10-CM | POA: Diagnosis not present

## 2019-06-19 DIAGNOSIS — K766 Portal hypertension: Secondary | ICD-10-CM | POA: Diagnosis not present

## 2019-06-19 DIAGNOSIS — I10 Essential (primary) hypertension: Secondary | ICD-10-CM | POA: Insufficient documentation

## 2019-06-19 DIAGNOSIS — Z8249 Family history of ischemic heart disease and other diseases of the circulatory system: Secondary | ICD-10-CM | POA: Insufficient documentation

## 2019-06-19 DIAGNOSIS — R590 Localized enlarged lymph nodes: Secondary | ICD-10-CM | POA: Insufficient documentation

## 2019-06-19 DIAGNOSIS — Z9841 Cataract extraction status, right eye: Secondary | ICD-10-CM | POA: Insufficient documentation

## 2019-06-19 DIAGNOSIS — Z833 Family history of diabetes mellitus: Secondary | ICD-10-CM | POA: Insufficient documentation

## 2019-06-19 DIAGNOSIS — K7460 Unspecified cirrhosis of liver: Secondary | ICD-10-CM | POA: Diagnosis not present

## 2019-06-19 DIAGNOSIS — Z79899 Other long term (current) drug therapy: Secondary | ICD-10-CM | POA: Insufficient documentation

## 2019-06-19 DIAGNOSIS — Z96642 Presence of left artificial hip joint: Secondary | ICD-10-CM | POA: Diagnosis not present

## 2019-06-19 DIAGNOSIS — F329 Major depressive disorder, single episode, unspecified: Secondary | ICD-10-CM | POA: Insufficient documentation

## 2019-06-19 DIAGNOSIS — Z823 Family history of stroke: Secondary | ICD-10-CM | POA: Diagnosis not present

## 2019-06-19 DIAGNOSIS — F1010 Alcohol abuse, uncomplicated: Secondary | ICD-10-CM | POA: Diagnosis not present

## 2019-06-19 DIAGNOSIS — R911 Solitary pulmonary nodule: Secondary | ICD-10-CM | POA: Diagnosis not present

## 2019-06-19 DIAGNOSIS — D649 Anemia, unspecified: Secondary | ICD-10-CM | POA: Insufficient documentation

## 2019-06-19 DIAGNOSIS — F17210 Nicotine dependence, cigarettes, uncomplicated: Secondary | ICD-10-CM | POA: Insufficient documentation

## 2019-06-19 DIAGNOSIS — K3189 Other diseases of stomach and duodenum: Secondary | ICD-10-CM | POA: Diagnosis not present

## 2019-06-19 DIAGNOSIS — J449 Chronic obstructive pulmonary disease, unspecified: Secondary | ICD-10-CM | POA: Diagnosis not present

## 2019-06-19 HISTORY — PX: ESOPHAGOGASTRODUODENOSCOPY (EGD) WITH PROPOFOL: SHX5813

## 2019-06-19 LAB — PROTIME-INR
INR: 1.1 (ref 0.8–1.2)
Prothrombin Time: 14.3 seconds (ref 11.4–15.2)

## 2019-06-19 SURGERY — ESOPHAGOGASTRODUODENOSCOPY (EGD) WITH PROPOFOL
Anesthesia: General

## 2019-06-19 MED ORDER — CHLORHEXIDINE GLUCONATE CLOTH 2 % EX PADS: 6.0000 | MEDICATED_PAD | Freq: Once | CUTANEOUS | Status: DC

## 2019-06-19 MED ORDER — LACTATED RINGERS IV SOLN: INTRAVENOUS | Status: DC

## 2019-06-19 MED ORDER — PROPOFOL 500 MG/50ML IV EMUL
INTRAVENOUS | Status: DC | PRN
  Administered 2019-06-19: 200 ug/kg/min via INTRAVENOUS

## 2019-06-19 MED ORDER — KETAMINE HCL 10 MG/ML IJ SOLN
INTRAMUSCULAR | Status: DC | PRN
  Administered 2019-06-19: 10 mg via INTRAVENOUS

## 2019-06-19 MED ORDER — PROPOFOL 10 MG/ML IV BOLUS
INTRAVENOUS | Status: DC | PRN
  Administered 2019-06-19: 20 mg via INTRAVENOUS

## 2019-06-19 MED ORDER — KETAMINE HCL 50 MG/5ML IJ SOSY
PREFILLED_SYRINGE | INTRAMUSCULAR | Status: AC
  Filled 2019-06-19: qty 5

## 2019-06-19 NOTE — Anesthesia Preprocedure Evaluation (Signed)
Anesthesia Evaluation  Patient identified by MRN, date of birth, ID band Patient awake    Reviewed: Allergy & Precautions, H&P , NPO status , Patient's Chart, lab work & pertinent test results, reviewed documented beta blocker date and time   Airway Mallampati: II  TM Distance: >3 FB Neck ROM: full    Dental no notable dental hx. (+) Edentulous Upper, Edentulous Lower   Pulmonary COPD, Current Smoker and Patient abstained from smoking.,    Pulmonary exam normal breath sounds clear to auscultation       Cardiovascular Exercise Tolerance: Good hypertension, negative cardio ROS   Rhythm:regular Rate:Normal     Neuro/Psych PSYCHIATRIC DISORDERS Anxiety Depression negative neurological ROS     GI/Hepatic negative GI ROS, Neg liver ROS,   Endo/Other  negative endocrine ROS  Renal/GU negative Renal ROS  negative genitourinary   Musculoskeletal   Abdominal   Peds  Hematology  (+) Blood dyscrasia, anemia ,   Anesthesia Other Findings   Reproductive/Obstetrics negative OB ROS                             Anesthesia Physical Anesthesia Plan  ASA: III  Anesthesia Plan: General   Post-op Pain Management:    Induction:   PONV Risk Score and Plan: 1 and Propofol infusion  Airway Management Planned:   Additional Equipment:   Intra-op Plan:   Post-operative Plan:   Informed Consent: I have reviewed the patients History and Physical, chart, labs and discussed the procedure including the risks, benefits and alternatives for the proposed anesthesia with the patient or authorized representative who has indicated his/her understanding and acceptance.     Dental Advisory Given  Plan Discussed with: CRNA  Anesthesia Plan Comments:         Anesthesia Quick Evaluation

## 2019-06-19 NOTE — Transfer of Care (Signed)
Immediate Anesthesia Transfer of Care Note  Patient: Ian Aguilar  Procedure(s) Performed: ESOPHAGOGASTRODUODENOSCOPY (EGD) WITH PROPOFOL (N/A )  Patient Location: PACU  Anesthesia Type:General  Level of Consciousness: awake  Airway & Oxygen Therapy: Patient Spontanous Breathing  Post-op Assessment: Report given to RN  Post vital signs: Reviewed and stable  Last Vitals:  Vitals Value Taken Time  BP    Temp    Pulse 82 06/19/19 1144  Resp 20 06/19/19 1144  SpO2 93 % 06/19/19 1144  Vitals shown include unvalidated device data.  Last Pain:  Vitals:   06/19/19 1054  TempSrc: Oral  PainSc: 0-No pain         Complications: No apparent anesthesia complications

## 2019-06-19 NOTE — Anesthesia Postprocedure Evaluation (Signed)
Anesthesia Post Note  Patient: Ian Aguilar  Procedure(s) Performed: ESOPHAGOGASTRODUODENOSCOPY (EGD) WITH PROPOFOL (N/A )  Patient location during evaluation: PACU Anesthesia Type: General Level of consciousness: awake and alert and oriented Pain management: pain level controlled Vital Signs Assessment: post-procedure vital signs reviewed and stable Respiratory status: spontaneous breathing Cardiovascular status: blood pressure returned to baseline and stable Postop Assessment: no apparent nausea or vomiting Anesthetic complications: no     Last Vitals:  Vitals:   06/19/19 1054  BP: (!) 188/97  Pulse: (!) 57  Resp: 12  Temp: 36.9 C  SpO2: 96%    Last Pain:  Vitals:   06/19/19 1054  TempSrc: Oral  PainSc: 0-No pain                 Taetum Flewellen

## 2019-06-19 NOTE — Progress Notes (Signed)
Patient up to bathroom and "pinched my finger on the door". Left 3rd finger distal with approximately 70mm ecchymosis.  No bleeding or swelling noted. "I bruise easy".  Offered ice to patient finger , patient declined, offered  to ask MD for xray.  "I don't need ice or an xray, I am always getting bruises for things".  Dr. Gala Romney notified.  No further orders.

## 2019-06-19 NOTE — Op Note (Signed)
[facility] Patient Name: Ian Aguilar Procedure Date: 06/19/2019 11:17 AM MRN: [id number] Date of Birth: 08/17/1940 Attending MD: Norvel Richards , MD CSN: [id number] Age: 79 Admit Type: Outpatient Procedure:                Upper GI endoscopy Indications:              Screening procedure Providers:                Norvel Richards, MD, Janeece Riggers, RN, Nelma Rothman, Technician Referring MD:             Halford Chessman MD, MD Medicines:                Propofol per Anesthesia Complications:            No immediate complications. Estimated Blood Loss:     Estimated blood loss: none. Procedure:                Pre-Anesthesia Assessment:                           - Prior to the procedure, a History and Physical                            was performed, and patient medications and                            allergies were reviewed. The patient's tolerance of                            previous anesthesia was also reviewed. The risks                            and benefits of the procedure and the sedation                            options and risks were discussed with the patient.                            All questions were answered, and informed consent                            was obtained. Prior Anticoagulants: The patient has                            taken no previous anticoagulant or antiplatelet                            agents. ASA Grade Assessment: III - A patient with                            severe systemic disease. After reviewing the risks  and benefits, the patient was deemed in                            satisfactory condition to undergo the procedure.                           After obtaining informed consent, the endoscope was                            passed under direct vision. Throughout the                            procedure, the patient's blood pressure, pulse, and                             oxygen saturations were monitored continuously. The                            [id number]-H190 ([id number]) scope was introduced through the                            mouth, and advanced to the second part of duodenum.                            The upper GI endoscopy was accomplished without                            difficulty. The patient tolerated the procedure                            well. Scope In: 11:33:06 AM Scope Out: 11:37:25 AM Total Procedure Duration: 0 hours 4 minutes 19 seconds  Findings:      The examined esophagus was normal.      Mild portal hypertensive gastropathy was found in the entire examined       stomach. Somewhat subtle areas of linear vascular ectasia in the antrum.       No ulcer or infiltrating process. No gastric varices.      The duodenal bulb and second portion of the duodenum were normal. Impression:               - Normal esophagus.                           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                           - No  specimens collected. Moderate Sedation:      Moderate (conscious) sedation was personally administered by an       anesthesia professional. The following parameters were monitored: oxygen       saturation, heart rate, blood pressure, respiratory rate, EKG, adequacy       of pulmonary ventilation, and response to care. Recommendation:           - Patient has a contact number available for                            emergencies. The signs and symptoms of potential                            delayed complications were discussed with the                            patient. Return to normal activities tomorrow.                            Written discharge instructions were provided to the                            patient.                           - Resume previous  diet.                           - Continue present medications. Repeat EGD in 2                            years for screening purposes.                           - Return to my office in 4 months. Procedure Code(s):        --- Professional ---                           [id number], Esophagogastroduodenoscopy, flexible,                            transoral; diagnostic, including collection of                            specimen(s) by brushing or washing, when performed                            (separate procedure) Diagnosis Code(s):        --- Professional ---                           K76.6, Portal hypertension                           K31.89, Other diseases of stomach and duodenum  Z13.810, Encounter for screening for upper                            gastrointestinal disorder CPT copyright 2019 [employer]. All rights reserved. The codes documented in this report are preliminary and upon coder review may  be revised to meet current compliance requirements. Cristopher Estimable. Nasario Czerniak, MD Norvel Richards, MD 06/19/2019 11:43:40 AM This report has been signed electronically. Number of Addenda: 0

## 2019-06-19 NOTE — Discharge Instructions (Signed)
EGD Discharge instructions Please read the instructions outlined below and refer to this sheet in the next few weeks. These discharge instructions provide you with general information on caring for yourself after you leave the hospital. Your doctor may also give you specific instructions. While your treatment has been planned according to the most current medical practices available, unavoidable complications occasionally occur. If you have any problems or questions after discharge, please call your doctor. ACTIVITY  You may resume your regular activity but move at a slower pace for the next 24 hours.   Take frequent rest periods for the next 24 hours.   Walking will help expel (get rid of) the air and reduce the bloated feeling in your abdomen.   No driving for 24 hours (because of the anesthesia (medicine) used during the test).   You may shower.   Do not sign any important legal documents or operate any machinery for 24 hours (because of the anesthesia used during the test).  NUTRITION  Drink plenty of fluids.   You may resume your normal diet.   Begin with a light meal and progress to your normal diet.   Avoid alcoholic beverages for 24 hours or as instructed by your caregiver.  MEDICATIONS  You may resume your normal medications unless your caregiver tells you otherwise.  WHAT YOU CAN EXPECT TODAY  You may experience abdominal discomfort such as a feeling of fullness or "gas" pains.  FOLLOW-UP  Your doctor will discuss the results of your test with you.  SEEK IMMEDIATE MEDICAL ATTENTION IF ANY OF THE FOLLOWING OCCUR:  Excessive nausea (feeling sick to your stomach) and/or vomiting.   Severe abdominal pain and distention (swelling).   Trouble swallowing.   Temperature over 101 F (37.8 C).   Rectal bleeding or vomiting of blood.    No esophageal varices found today which is good news  Repeat EGD in 2 years  Keep upcoming office visit in about 4 months from  now  At patient request, I called Jamiere Gulas at [phone] -no answer

## 2019-06-19 NOTE — Progress Notes (Signed)
Wife located after multiple calls via cell phone and lobby.  Discharge instructions given to wife and patient. Patient discharged to home with wife via wheelchair at 12:52.

## 2019-06-19 NOTE — H&P (Signed)
_0 @   Primary Care Physician:  Sharilyn Sites, MD Primary Gastroenterologist:  Dr. Gala Romney  Pre-Procedure History & Physical: HPI:  Ian Aguilar is a 79 y.o. male here for variceal screening.  No upper GI tract symptoms.  Specifically denies dysphagia.  Past Medical History:  Diagnosis Date  . Anxiety   . Arthritis   . Cancer ([place])   . Cirrhosis ([place])    confirmed by MRI on 07/04/11.    Marland Kitchen COPD (chronic obstructive pulmonary disease) ([place])   . Depression with anxiety   . ETOH abuse   . Hyperlipidemia   . Hypertension    diet control  . Nodule of upper lobe of right lung    with mediastinal adenopathy  . Wears dentures   . Wears glasses     Past Surgical History:  Procedure Laterality Date  . bilateral cataract surgery     [place]  . broken arm  6 yrs ago   left otif of wrist-[place]  . CLOSED REDUCTION WRIST FRACTURE Right 09/02/2015   Procedure: RIGHT WRIST REDUCTION;  Surgeon: Renette Butters, MD;  Location: [facility] [place];  Service: Orthopedics;  Laterality: Right;  with MAC  . COLONOSCOPY  1996   [place]hman: external hemorrhoids, no polyps  . COLONOSCOPY  06/28/2011   Dr. Ala Bent diverticulosis,tubular adenoma, hyperplastic polyp  . COLONOSCOPY WITH PROPOFOL N/A 04/26/2017   Dr. Gala Romney: perianal and digital rectal examinations normal, diffusely congested colonic mucosa but otherwise normal  . ESOPHAGOGASTRODUODENOSCOPY  06/28/2011   Dr. Chelsea Aus erosive reflux esophagitis, hiatal hernia-gastritis  . ESOPHAGOGASTRODUODENOSCOPY (EGD) WITH PROPOFOL N/A 04/26/2017   Dr. Gala Romney: normal esophagus, small hiatal hernia, GAVE, portal hypertensive gastropathy, normal duodenal bulb and second portion, no specimens collected. 2 years screening   . EXTERNAL EAR SURGERY     right ear-cleaned out ear and created new eardrum  . INGUINAL HERNIA REPAIR  2-3 yrs ago   left-[place]-[facility]_  . INTRAMEDULLARY (IM) NAIL INTERTROCHANTERIC Right 09/02/2015   Procedure: RIGHT  INTERTROCHANTRIC HIP;   Surgeon: Renette Butters, MD;  Location: [facility];  Service: Orthopedics;  Laterality: Right;  With MAC  . KNEE SURGERY     left-arthroscopy-[place]  . PORTACATH PLACEMENT Left 06/08/2017   Procedure: INSERTION POWER PORT WITH  ATTACHED CATHETER LEFT SUBCLAVIAN;  Surgeon: Aviva Signs, MD;  [facility];  Service: General;  Laterality: Left;  . SKULL FRACTURE ELEVATION     fractured cheeck bone repaired  . TOTAL HIP ARTHROPLASTY  12/18/2011   Procedure: TOTAL HIP ARTHROPLASTY;  Surgeon: Carole Civil, MD;  [facility];  Service: Orthopedics;  Laterality: Left;  Marland Kitchen VIDEO BRONCHOSCOPY WITH ENDOBRONCHIAL ULTRASOUND N/A 05/04/2017   Procedure: VIDEO BRONCHOSCOPY WITH ENDOBRONCHIAL ULTRASOUND;  Surgeon: [place] Nakayama, MD;  Location: [place];  Service: Thoracic;  Laterality: N/A;  . WISDOM TOOTH EXTRACTION      Prior to Admission medications   Medication Sig Start Date End Date Taking? Authorizing Provider  albuterol (VENTOLIN HFA) 108 (90 Base) MCG/ACT inhaler Inhale 2 puffs into the lungs daily.  06/05/19  Yes [provider]  cholecalciferol (VITAMIN D) 1000 units tablet Take 1,000 Units by mouth daily.   Yes [provider]  guaiFENesin (MUCINEX) 600 MG 12 hr tablet Take 600 mg by mouth 2 (two) times daily as needed (congestion).   Yes [provider]  levothyroxine (SYNTHROID) 100 MCG tablet TAKE 1 TABLET(100 MCG) BY MOUTH DAILY BEFORE BREAKFAST Patient taking differently: Take 100 mcg by mouth daily before breakfast.  06/03/19  Yes Lockamy, Randi L, NP-C  loratadine (CLARITIN) 10 MG tablet Take 10 mg by mouth daily.   Yes [provider]  meclizine (ANTIVERT) 25 MG tablet Take 25 mg by mouth 3 (three) times daily as needed for dizziness.  12/07/17  Yes [provider]  Propylene Glycol-Glycerin (SOOTHE) 0.6-0.6 % SOLN Place 1-2 drops into both eyes as needed (for dry/irritated eyes.).    Yes [provider]  rosuvastatin  (CRESTOR) 10 MG tablet Take 10 mg by mouth daily. 06/13/19  Yes [provider]  SPIRIVA HANDIHALER 18 MCG inhalation capsule Place 18 mcg into inhaler and inhale daily.  10/18/17  Yes [provider]    Allergies as of 04/08/2019  . (No Known Allergies)    Family History  Problem Relation Age of Onset  . Stroke Mother 80  . Heart disease Father 72       MI  . Diabetes Maternal Uncle   . Colon cancer Neg Hx   . Cancer Neg Hx     Social History   Socioeconomic History  . Marital status: Married    Spouse name: Not on file  . Number of children: Not on file  . Years of education: Not on file  . Highest education level: Not on file  Occupational History  . Occupation: retired    Fish farm manager: [employer]  Tobacco Use  . Smoking status: Current Every Day Smoker    Packs/day: 0.25    Years: 62.00    Pack years: 15.50    Types: Cigarettes  . Smokeless tobacco: Never Used  . Tobacco comment: 1/2 ppd > 60 years as of 06/27/17: 4-5 cigarettes or less a day  Substance and Sexual Activity  . Alcohol use: Not Currently    Comment: stopped 4 years ago  . Drug use: No  . Sexual activity: Yes    Birth control/protection: None  Other Topics Concern  . Not on file  Social History Narrative  . Not on file   Social Determinants of Health   Financial Resource Strain:   . Difficulty of Paying Living Expenses:   Food Insecurity:   . Worried About Charity fundraiser in the Last Year:   . Arboriculturist in the Last Year:   Transportation Needs:   . Film/video editor (Medical):   Marland Kitchen Lack of Transportation (Non-Medical):   Physical Activity:   . Days of Exercise per Week:   . Minutes of Exercise per Session:   Stress:   . Feeling of Stress :   Social Connections:   . Frequency of Communication with Friends and Family:   . Frequency of Social Gatherings with Friends and Family:   . Attends Religious Services:   . Active Member of Clubs or  Organizations:   . Attends Archivist Meetings:   Marland Kitchen Marital Status:   Intimate Partner Violence:   . Fear of Current or Ex-Partner:   . Emotionally Abused:   Marland Kitchen Physically Abused:   . Sexually Abused:     Review of Systems: See HPI, otherwise negative ROS  Physical Exam: BP (!) 188/97   Pulse (!) 57   Temp 98.5 F (36.9 C) (Oral)   Resp 12   Ht _0  (1.803 m)   Wt 78.9 kg   SpO2 96%   BMI 24.26 kg/m  General:   Alert,  Well-developed, well-nourished, pleasant and cooperative in NAD Neck:  Supple; no masses or thyromegaly.  No significant cervical adenopathy. Lungs:  Clear throughout to auscultation.   No wheezes, crackles, or rhonchi. No acute distress. Heart:  Regular rate and rhythm; no murmurs, clicks, rubs,  or gallops. Abdomen: Non-distended, normal bowel sounds.  Soft and nontender without appreciable mass or hepatosplenomegaly.  Pulses:  Normal pulses noted. Extremities:  Without clubbing or edema.  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  Here for variceal screening examination per plan. The risks, benefits, limitations, alternatives and imponderables have been reviewed with the patient. Potential for esophageal dilation, biopsy, etc. have also been reviewed.  Questions have been answered. All parties agreeable.     Notice: This dictation was prepared with Dragon dictation along with smaller phrase technology. Any transcriptional errors that result from this process are unintentional and may not be corrected upon review.

## 2019-06-20 LAB — AFP TUMOR MARKER: AFP, Serum, Tumor Marker: 2.1 ng/mL (ref 0.0–8.3)

## 2019-06-23 ENCOUNTER — Inpatient Hospital Stay (HOSPITAL_COMMUNITY): Payer: PPO | Admitting: Hematology

## 2019-06-23 ENCOUNTER — Other Ambulatory Visit: Payer: Self-pay

## 2019-06-23 VITALS — BP 150/72 | HR 69 | Temp 97.7°F | Resp 18 | Wt 174.0 lb

## 2019-06-23 DIAGNOSIS — C3411 Malignant neoplasm of upper lobe, right bronchus or lung: Secondary | ICD-10-CM | POA: Diagnosis not present

## 2019-06-23 DIAGNOSIS — C3490 Malignant neoplasm of unspecified part of unspecified bronchus or lung: Secondary | ICD-10-CM

## 2019-06-23 DIAGNOSIS — C3491 Malignant neoplasm of unspecified part of right bronchus or lung: Secondary | ICD-10-CM

## 2019-06-23 NOTE — Patient Instructions (Signed)
[facility] at [facility] Discharge Instructions  You were seen today by Dr. Delton Coombes. He went over your recent lab and scan results. He will see you back in 6 months for lab ,CT Chest and follow up.   Thank you for choosing [facility] at [facility] to provide your oncology and hematology care.  To afford each patient quality time with our provider, please arrive at least 15 minutes before your scheduled appointment time.   If you have a lab appointment with the [facility] please come in thru the  Main Entrance and check in at the main information desk  You need to re-schedule your appointment should you arrive 10 or more minutes late.  We strive to give you quality time with our providers, and arriving late affects you and other patients whose appointments are after yours.  Also, if you no show three or more times for appointments you may be dismissed from the clinic at the providers discretion.     Again, thank you for choosing [facility].  Our hope is that these requests will decrease the amount of time that you wait before being seen by our physicians.       _____________________________________________________________  Should you have questions after your visit to [facility], please contact our office at [phone] between the hours of 8:00 a.m. and 4:30 p.m.  Voicemails left after 4:00 p.m. will not be returned until the following business day.  For prescription refill requests, have your pharmacy contact our office and allow 72 hours.    Cancer Center Support Programs:   > Cancer Support Group  2nd Tuesday of the month 1pm-2pm, Journey Room

## 2019-06-23 NOTE — Progress Notes (Signed)
[place], [place] [address]   CLINIC:  Medical Oncology/Hematology  PCP:  Sharilyn Sites, [place] [address] [phone]   REASON FOR VISIT: Follow-up for lung cancer.  CURRENT THERAPY:  Surveillance.  BRIEF ONCOLOGIC HISTORY:  Oncology History  Carcinoma, lung ([place])  06/05/2017 Initial Diagnosis   Carcinoma, lung ([place])   06/05/2017 - 07/18/2017 Chemotherapy   The patient had palonosetron (ALOXI) injection 0.25 mg, 0.25 mg, Intravenous,  Once, 6 of 6 cycles Administration: 0.25 mg (06/11/2017), 0.25 mg (07/09/2017), 0.25 mg (07/16/2017), 0.25 mg (06/18/2017), 0.25 mg (06/25/2017), 0.25 mg (07/02/2017) CARBOplatin (PARAPLATIN) 190 mg in sodium chloride 0.9 % 250 mL chemo infusion, 190 mg (100 % of original dose 192.8 mg), Intravenous,  Once, 6 of 6 cycles Dose modification:   (original dose 192.8 mg, Cycle 1),   (original dose 192.8 mg, Cycle 5), 144.6 mg (original dose 144.6 mg, Cycle 6),   (original dose 192.8 mg, Cycle 2),   (original dose 192.8 mg, Cycle 3),   (original dose 192.8 mg, Cycle 4) Administration: 190 mg (06/11/2017), 190 mg (07/09/2017), 140 mg (07/16/2017), 190 mg (06/18/2017), 190 mg (06/25/2017), 190 mg (07/02/2017) PACLitaxel (TAXOL) 90 mg in dextrose 5 % 250 mL chemo infusion (</= 56m/m2), 45 mg/m2 = 90 mg, Intravenous,  Once, 6 of 6 cycles Dose modification: 40.5 mg/m2 (90 % of original dose 45 mg/m2, Cycle 5, Reason: Other (see comments), Comment: early neuropathy), 36 mg/m2 (80 % of original dose 45 mg/m2, Cycle 6, Reason: Provider Judgment) Administration: 90 mg (06/11/2017), 78 mg (07/09/2017), 72 mg (07/16/2017), 90 mg (06/18/2017), 78 mg (06/25/2017), 78 mg (07/02/2017)  for chemotherapy treatment.    Malignant neoplasm of bronchus and lung ([place]  06/08/2017 Initial Diagnosis   Malign[place] ne[place] of [place]us and lung ([place]   09/20/2017 -  Chemotherapy   The patient had durvalumab (IMFINZI) 860 mg in sodium  chloride 0.9 % 100 mL chemo infusion, 800 mg, Intravenous,  Once, 26 of 26 cycles Administration: 860 mg (09/20/2017), 860 mg (10/04/2017), 860 mg (10/18/2017), 860 mg (11/01/2017), 860 mg (11/15/2017), 860 mg (11/29/2017), 860 mg (12/13/2017), 860 mg (12/27/2017), 860 mg (01/10/2018), 860 mg (01/28/2018), 860 mg (02/11/2018), 860 mg (02/25/2018), 860 mg (03/11/2018), 860 mg (03/25/2018), 860 mg (04/08/2018), 860 mg (04/22/2018), 860 mg (05/08/2018), 860 mg (05/27/2018), 860 mg (06/10/2018), 860 mg (06/25/2018), 860 mg (07/09/2018), 860 mg (07/23/2018), 860 mg (08/06/2018), 860 mg (08/20/2018), 860 mg (09/03/2018), 860 mg (09/17/2018)  for chemotherapy treatment.       INTERVAL HISTORY:  Mr. [id number]. male seen for follow-up of right lung cancer.  Reports appetite 100%.  Energy levels are 50%.  Cough and shortness of breath on exertion are stable.  Has reported some falls when he tries to squat down.  He has balance issues.  Denies any new onset cough or hemoptysis.  No headaches or vision changes reported.     REVIEW OF SYSTEMS:  Review of Systems  Respiratory: Positive for cough and shortness of breath.   Neurological: Positive for numbness.  All other systems reviewed and are negative.    PAST MEDICAL/SURGICAL HISTORY:  Past Medical History:  Diagnosis Date  . Anxiety   . Arthritis   . Cancer ([facility] [place]   . Cirrhosis ([place]    confirmed by MRI on 07/04/11.    .Marland KitchenCOPD (chronic obstructive pulmonary disease) ([place]   . Depression with anxiety   . ETOH abuse   . Hyperlipidemia   .  Hypertension    diet control  . Nodule of upper lobe of right lung    with mediastinal adenopathy  . Wears dentures   . Wears glasses    Past Surgical History:  Procedure Laterality Date  . bilateral cataract surgery       . broken arm  6 yrs ago   left otif of wrist-[place]  . CLOSED REDUCTION WRIST FRACTURE Right 09/02/2015   Procedure: RIGHT WRIST REDUCTION;  Surgeon: Renette Butters, MD;  Location: [place];   Service: Orthopedics;  Laterality: Right;  with MAC  . COLONOSCOPY  1996   [place]: external hemorrhoids, no polyps  . COLONOSCOPY  06/28/2011   Dr. Ala Bent diverticulosis,tubular adenoma, hyperplastic polyp  . COLONOSCOPY WITH PROPOFOL N/A 04/26/2017   Dr. Gala Romney: perianal and digital rectal examinations normal, diffusely congested colonic mucosa but otherwise normal  . ESOPHAGOGASTRODUODENOSCOPY  06/28/2011   Dr. Chelsea Aus erosive reflux esophagitis, hiatal hernia-gastritis  . ESOPHAGOGASTRODUODENOSCOPY (EGD) WITH PROPOFOL N/A 04/26/2017   Dr. Gala Romney: normal esophagus, small hiatal hernia, GAVE, portal hypertensive gastropathy, normal duodenal bulb and second portion, no specimens collected. 2 years screening   . ESOPHAGOGASTRODUODENOSCOPY (EGD) WITH PROPOFOL N/A 06/19/2019   Procedure: ESOPHAGOGASTRODUODENOSCOPY (EGD) WITH PROPOFOL;  Surgeon: Daneil Dolin, MD;  [facility];  Service: Endoscopy;  Laterality: N/A;  12:15pm  . EXTERNAL EAR SURGERY     right ear-cleaned out ear and created new eardrum  . INGUINAL HERNIA REPAIR  2-3 yrs ago   left-[place]  . INTRAMEDULLARY (IM) NAIL INTERTROCHANTERIC Right 09/02/2015   Procedure: RIGHT  INTERTROCHANTRIC HIP;  Surgeon: Renette Butters, MD;  Location: [place];  Service: Orthopedics;  Laterality: Right;  With MAC  . KNEE SURGERY     left-arthroscopy-[place]  . PORTACATH PLACEMENT Left 06/08/2017   Procedure: INSERTION POWER PORT WITH  ATTACHED CATHETER LEFT SUBCLAVIAN;  Surgeon: Aviva Signs, MD;  [facility];  Service: General;  Laterality: Left;  . SKULL FRACTURE ELEVATION     fractured cheeck bone repaired  . TOTAL HIP ARTHROPLASTY  12/18/2011   Procedure: TOTAL HIP ARTHROPLASTY;  Surgeon: Carole Civil, MD;  [facility];  Service: Orthopedics;  Laterality: Left;  Marland Kitchen VIDEO BRONCHOSCOPY WITH ENDOBRONCHIAL ULTRASOUND N/A 05/04/2017   Procedure: VIDEO BRONCHOSCOPY WITH ENDOBRONCHIAL ULTRASOUND;  Surgeon: [place] Nakayama, MD;  Location: [place];  Service: Thoracic;  Laterality: N/A;  . WISDOM TOOTH EXTRACTION       SOCIAL HISTORY:  Social History   Socioeconomic History  . Marital status: Married    Spouse name: Not on file  . Number of children: Not on file  . Years of education: Not on file  . Highest education level: Not on file  Occupational History  . Occupation: retired    Fish farm manager: [employer]  Tobacco Use  . Smoking status: Current Every Day Smoker    Packs/day: 0.25    Years: 62.00    Pack years: 15.50    Types: Cigarettes  . Smokeless tobacco: Never Used  . Tobacco comment: 1/2 ppd > 60 years as of 06/27/17: 4-5 cigarettes or less a day  Substance and Sexual Activity  . Alcohol use: Not Currently    Comment: stopped 4 years ago  . Drug use: No  . Sexual activity: Yes    Birth control/protection: None  Other Topics Concern  . Not on file  Social History Narrative  . Not on file   Social Determinants of Health   Financial  Resource Strain:   . Difficulty of Paying Living Expenses:   Food Insecurity:   . Worried About Charity fundraiser in the Last Year:   . Arboriculturist in the Last Year:   Transportation Needs:   . Film/video editor (Medical):   Marland Kitchen Lack of Transportation (Non-Medical):   Physical Activity:   . Days of Exercise per Week:   . Minutes of Exercise per Session:   Stress:   . Feeling of Stress :   Social Connections:   . Frequency of Communication with Friends and Family:   . Frequency of Social Gatherings with Friends and Family:   . Attends Religious Services:   . Active Member of Clubs or Organizations:   . Attends Archivist Meetings:   Marland Kitchen Marital Status:   Intimate Partner Violence:   . Fear of Current or Ex-Partner:   . Emotionally Abused:   Marland Kitchen Physically Abused:   . Sexually Abused:     FAMILY HISTORY:  Family History  Problem Relation Age of Onset  . Stroke Mother 32  . Heart disease Father 67       MI    . Diabetes Maternal Uncle   . Colon cancer Neg Hx   . Cancer Neg Hx     CURRENT MEDICATIONS:  Outpatient Encounter Medications as of 06/23/2019  Medication Sig  . albuterol (VENTOLIN HFA) 108 (90 Base) MCG/ACT inhaler Inhale 2 puffs into the lungs daily.   . cholecalciferol (VITAMIN D) 1000 units tablet Take 1,000 Units by mouth daily.  Marland Kitchen levothyroxine (SYNTHROID) 100 MCG tablet TAKE 1 TABLET(100 MCG) BY MOUTH DAILY BEFORE BREAKFAST (Patient taking differently: Take 100 mcg by mouth daily before breakfast. )  . loratadine (CLARITIN) 10 MG tablet Take 10 mg by mouth daily.  . rosuvastatin (CRESTOR) 10 MG tablet Take 10 mg by mouth daily.  Marland Kitchen SPIRIVA HANDIHALER 18 MCG inhalation capsule Place 18 mcg into inhaler and inhale daily.   Marland Kitchen guaiFENesin (MUCINEX) 600 MG 12 hr tablet Take 600 mg by mouth 2 (two) times daily as needed (congestion).  . meclizine (ANTIVERT) 25 MG tablet Take 25 mg by mouth 3 (three) times daily as needed for dizziness.   Marland Kitchen Propylene Glycol-Glycerin (SOOTHE) 0.6-0.6 % SOLN Place 1-2 drops into both eyes as needed (for dry/irritated eyes.).    No facility-administered encounter medications on file as of 06/23/2019.    ALLERGIES:  No Known Allergies   PHYSICAL EXAM:  ECOG Performance status: 1  Vitals:   06/23/19 1424  BP: (!) 150/72  Pulse: 69  Resp: 18  Temp: 97.7 F (36.5 C)  SpO2: 100%   Filed Weights   06/23/19 1424  Weight: 174 lb (78.9 kg)    Physical Exam Constitutional:      Appearance: Normal appearance. He is normal weight.  Cardiovascular:     Rate and Rhythm: Normal rate and regular rhythm.     Heart sounds: Normal heart sounds.  Pulmonary:     Effort: Pulmonary effort is normal.     Breath sounds: Normal breath sounds.  Abdominal:     General: Bowel sounds are normal.     Palpations: Abdomen is soft.  Musculoskeletal:        General: Normal range of motion.  Skin:    General: Skin is warm and dry.  Neurological:     Mental  Status: He is alert and oriented to person, place, and time. Mental status is at baseline.  Psychiatric:  Mood and Affect: Mood normal.        Behavior: Behavior normal.        Thought Content: Thought content normal.        Judgment: Judgment normal.      LABORATORY DATA:  I have reviewed the labs as listed.  CBC    Component Value Date/Time   WBC 5.0 06/16/2019 1221   RBC 4.31 06/16/2019 1221   HGB 13.4 06/16/2019 1221   HCT 42.6 06/16/2019 1221   PLT 170 06/16/2019 1221   MCV 98.8 06/16/2019 1221   MCH 31.1 06/16/2019 1221   MCHC 31.5 06/16/2019 1221   RDW 14.8 06/16/2019 1221   LYMPHSABS 0.8 06/16/2019 1221   MONOABS 0.5 06/16/2019 1221   EOSABS 0.1 06/16/2019 1221   BASOSABS 0.0 06/16/2019 1221   CMP Latest Ref Rng & Units 06/16/2019 12/13/2018 10/16/2018  Glucose 70 - 99 mg/dL 107(H) 97 108(H)  BUN 8 - 23 mg/dL _0 Creatinine 0.61 - 1.24 mg/dL 1.11 0.94 0.83  Sodium 135 - 145 mmol/L 138 137 140  Potassium 3.5 - 5.1 mmol/L 4.2 4.1 3.8  Chloride 98 - 111 mmol/L 97(L) 96(L) 100  CO2 22 - 32 mmol/L 31 32 31  Calcium 8.9 - 10.3 mg/dL 9.1 9.2 9.1  Total Protein 6.5 - 8.1 g/dL 7.4 7.7 7.1  Total Bilirubin 0.3 - 1.2 mg/dL 0.8 1.1 0.8  Alkaline Phos 38 - 126 U/L 88 84 88  AST 15 - 41 U/L _1 ALT 0 - 44 U/L _2 DIAGNOSTIC IMAGING:  I have reviewed scans.    ASSESSMENT & PLAN:   Malignant neoplasm of bronchus and lung ([place]) 1.  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 -Durvalumab consolidation from 09/19/2017 through 09/17/2018. -Denies any cough or hemoptysis. -I reviewed CT chest with contrast from 06/16/2019 which showed 9 mm nodule in the medial right upper lobe unchanged.  Adjacent radiation changes.  No evidence of progression or metastatic disease. -I have recommended 45-monthfollow-up with repeat CT scan of the chest.  2.   Neuropathy: -He has some numbness on and off in both legs from prior paclitaxel.  Denies any neuropathic pains.  3.  Hypothyroidism: -He will continue Synthroid 100 mcg daily.  We will check his TSH periodically.     Orders placed this encounter:  Orders Placed This Encounter  Procedures  . CT Chest W Contrast  . CBC with Differential/Platelet  . Comprehensive metabolic panel      SDerek Jack MD A[place]stone [phone]

## 2019-06-23 NOTE — Assessment & Plan Note (Signed)
1.  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 -Durvalumab consolidation from 09/19/2017 through 09/17/2018. -Denies any cough or hemoptysis. -I reviewed CT chest with contrast from 06/16/2019 which showed 9 mm nodule in the medial right upper lobe unchanged.  Adjacent radiation changes.  No evidence of progression or metastatic disease. -I have recommended 92-month follow-up with repeat CT scan of the chest.  2.  Neuropathy: -He has some numbness on and off in both legs from prior paclitaxel.  Denies any neuropathic pains.  3.  Hypothyroidism: -He will continue Synthroid 100 mcg daily.  We will check his TSH periodically.

## 2019-06-27 DIAGNOSIS — J449 Chronic obstructive pulmonary disease, unspecified: Secondary | ICD-10-CM | POA: Diagnosis not present

## 2019-06-27 DIAGNOSIS — Z720 Tobacco use: Secondary | ICD-10-CM | POA: Diagnosis not present

## 2019-07-07 DIAGNOSIS — J449 Chronic obstructive pulmonary disease, unspecified: Secondary | ICD-10-CM | POA: Diagnosis not present

## 2019-08-07 DIAGNOSIS — J449 Chronic obstructive pulmonary disease, unspecified: Secondary | ICD-10-CM | POA: Diagnosis not present

## 2019-09-06 DIAGNOSIS — J449 Chronic obstructive pulmonary disease, unspecified: Secondary | ICD-10-CM | POA: Diagnosis not present

## 2019-09-29 IMAGING — MR MR LUMBAR SPINE WO/W CM
4 of 7 series · 13 of 48 positions shown · IV contrast (multihance)
Comparison: Pat 03/29/2017

CLINICAL DATA: Lung cancer. PET uptake region of L4 spinous
process. Rule out metastatic disease.

EXAM:
MRI LUMBAR SPINE WITHOUT AND WITH CONTRAST
TECHNIQUE: Multiplanar and multiecho pulse sequences of the lumbar spine were
obtained without and with intravenous contrast.
CONTRAST:  17 mL MultiHance IV

[Series 6: T1 · sagittal · 4.0mm · 0.47mm/px · 3 of 13 slices shown (1 of 2)]
[im 1/13]
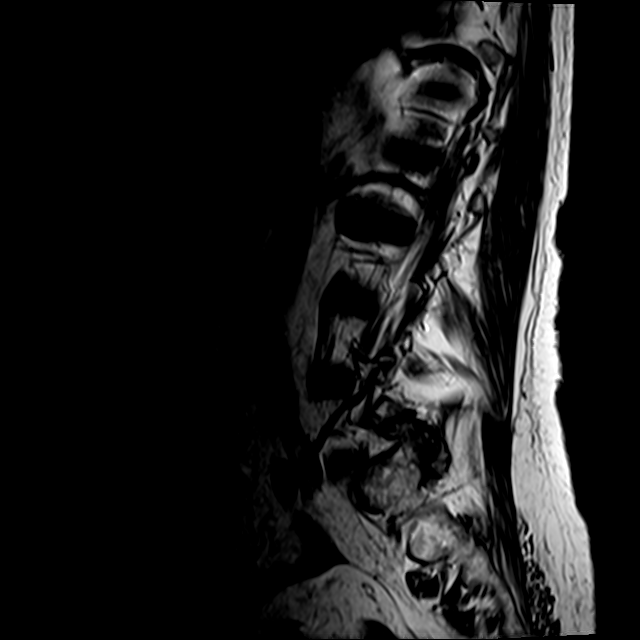
[im 7/13]
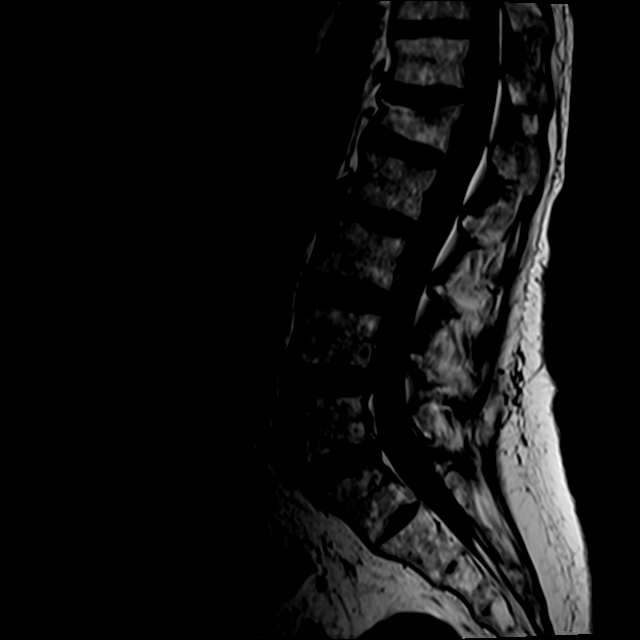
[im 13/13]
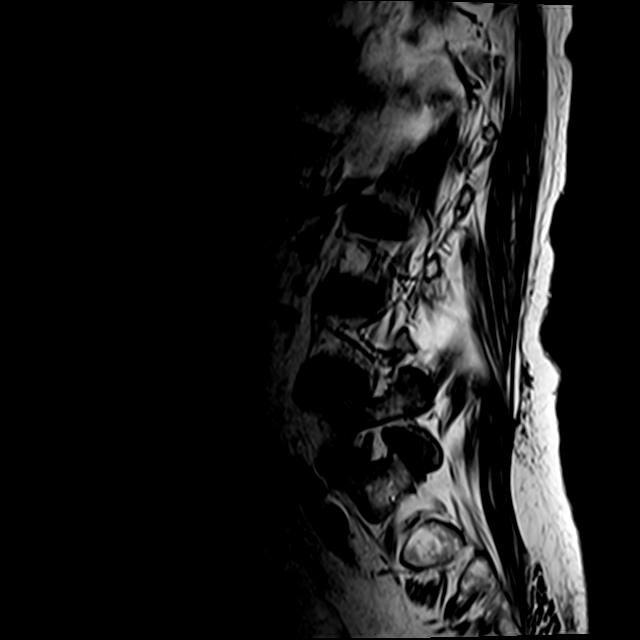

[Series 8: T2 · axial · 4.0mm · 0.27mm/px · z∈[-638,-429]mm · 4 of 48 slices shown]
[im 1/48]
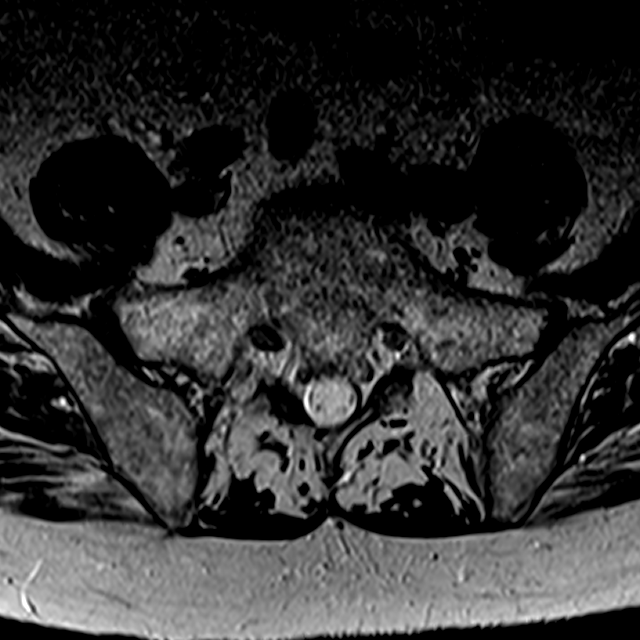
[im 9/48]
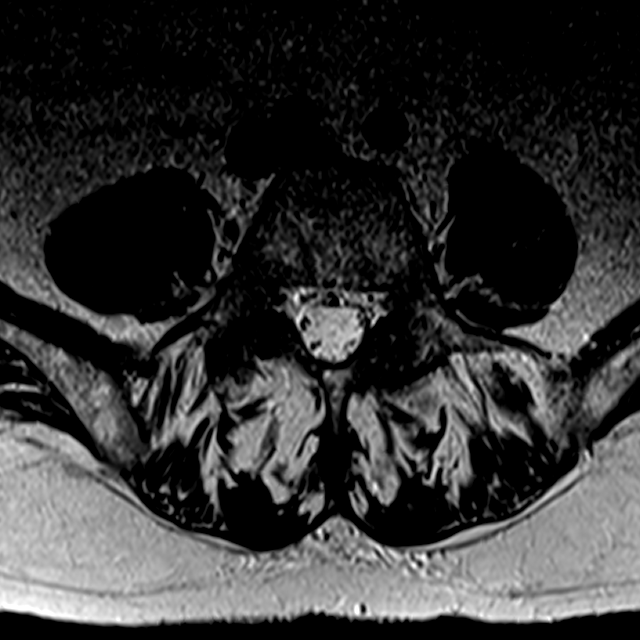
[im 26/48]
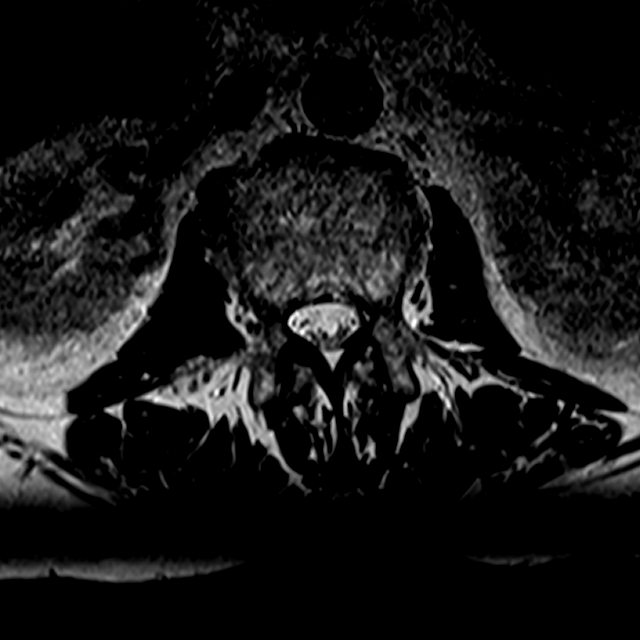
[im 43/48]
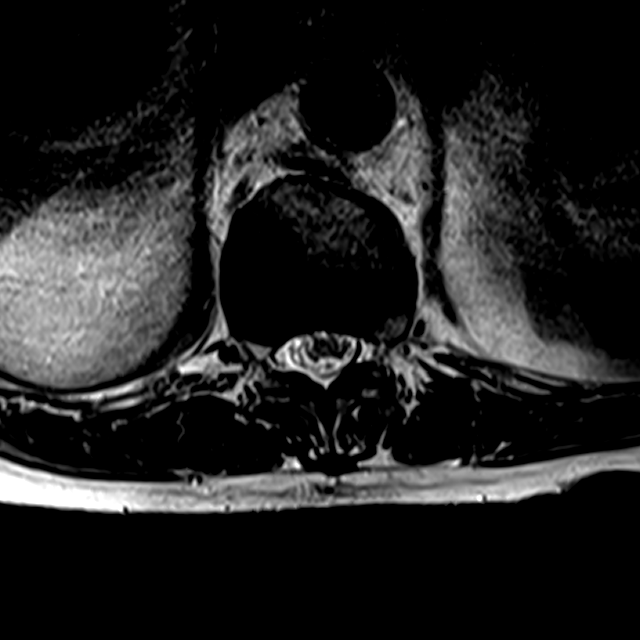

[Series 9: T1 · axial · 4.0mm · 0.27mm/px · z∈[-603,-429]mm · 3 of 48 slices shown (2 of 2)]
[im 9/48]
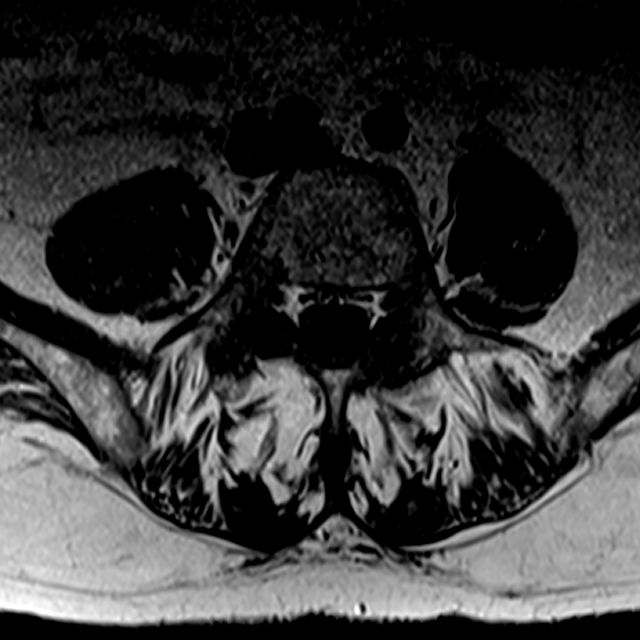
[im 26/48]
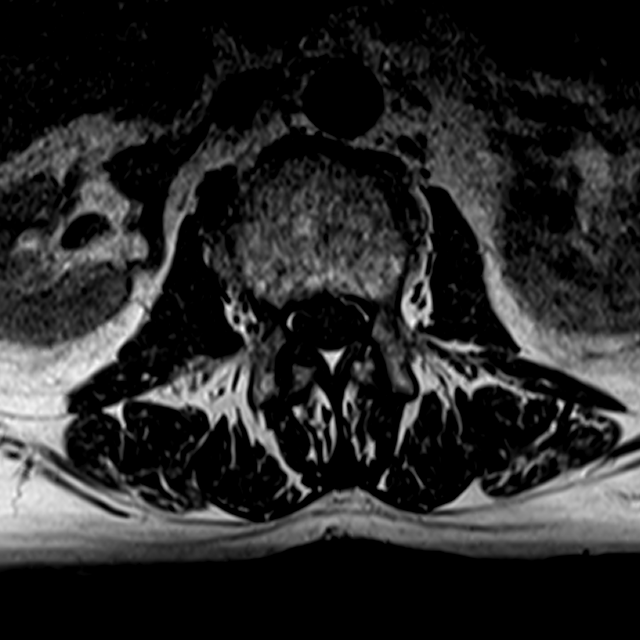
[im 43/48]
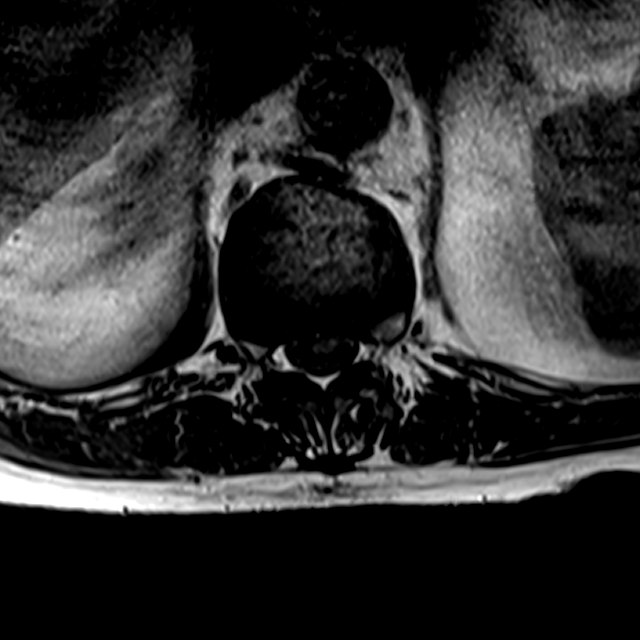

[Series 10: T2 post-contrast · sagittal · 4.0mm · 0.47mm/px · 3 of 13 slices shown]
[im 1/13]
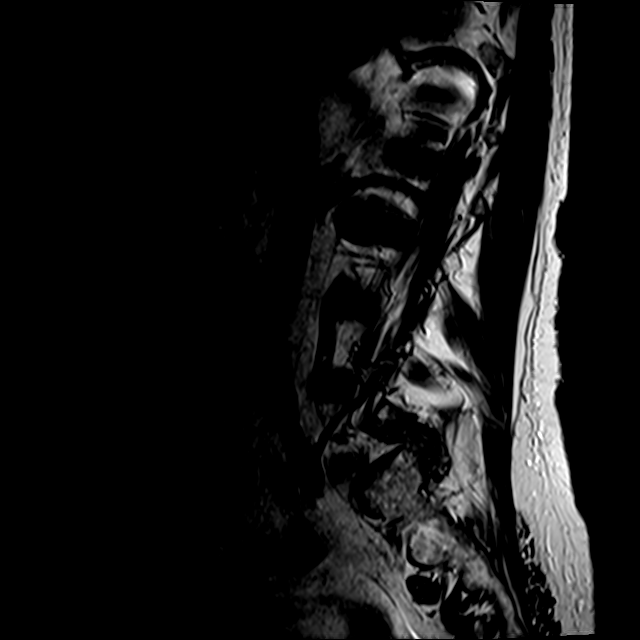
[im 7/13]
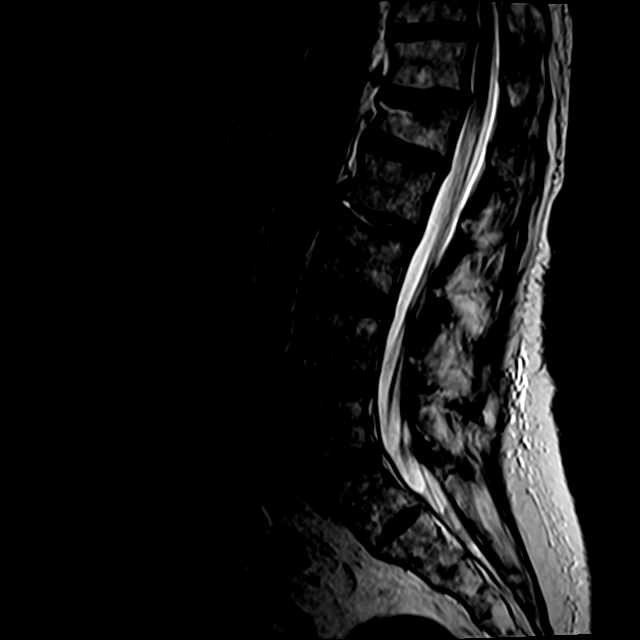
[im 13/13]
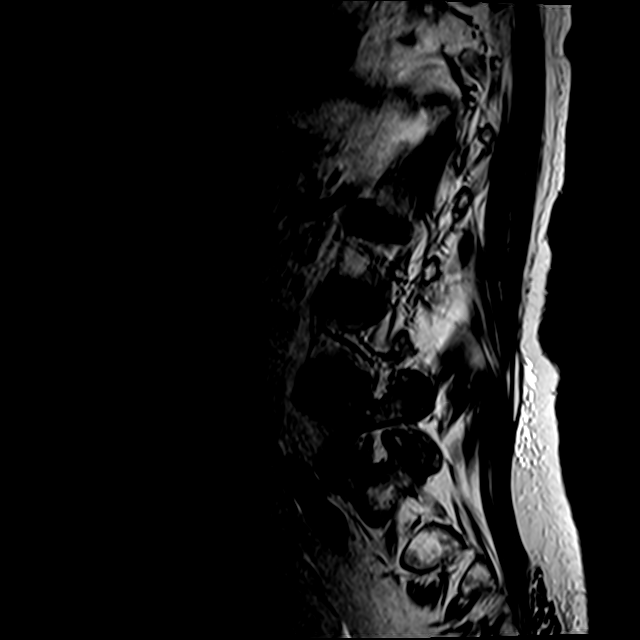

[13 of 48 positions shown; findings below may reference images not displayed]

FINDINGS: Segmentation: Image quality degraded by motion toward the end of the
study particularly post-contrast images.

Normal segmentation.  S1 partially lumbarized

Alignment:  Normal

Vertebrae: Chronic compression fracture L1 appears benign. No acute
fracture.

Review of the PET scan reveals focal small area of uptake which
appears to localize between the spinous processes of L4 and L5. No
bone lesion is seen in this area. There is slight soft tissue
enhancement in this area which is likely degenerative. No fluid is
seen in the interspinous space at L4-5. No other areas are present
which are suspicious for metastatic disease.

Conus medullaris and cauda equina: Conus extends to the L1-2 level.
Conus and cauda equina appear normal.

Paraspinal and other soft tissues: Normal retroperitoneal
structures. Paraspinous soft tissues normal.

Disc levels:

T12-L1: Disc degeneration and spurring without stenosis. Mild facet
degeneration

L1-2: Mild disc and facet degeneration without stenosis

L2-3: Mild disc degeneration without stenosis

L3-4: Mild disc and facet degeneration without stenosis

L4-5: Mild disc and facet degeneration without stenosis

L5-S1: Mild disc and facet degeneration. Mild subarticular stenosis
on the right.
IMPRESSION: The area of uptake on PET scan appears to be the interspinous space
between L4-L5. There is mild enhancement in the soft tissues in this
area without evidence of bony lesion. Favor degenerative process. No
soft tissue mass or bone lesion. No other areas of suspicion for
metastatic disease

Chronic fracture L1

Multilevel degenerative change in the lumbar spine as above.

## 2019-10-06 NOTE — Progress Notes (Signed)
Referring Provider: Sharilyn Sites, MD Primary Care Physician:  Sharilyn Sites, MD Primary GI: Dr. Gala Romney    Chief Complaint  Patient presents with  . Follow-up    cirrhosis    HPI:   Ian Aguilar is a 79 y.o. 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 He has had multiple CTs but only one MRI in past. On CT, the cystic lesion has remained stable for approximately 7 years. Needs surveillance MRI next April and would recommend every 2 years MRI. Oct 2021 due for routine [place]. Completed Hepatitis vaccinations. Received COVID vaccine around June 2021.   EGD April 2021 with normal esophagus, portal gastropathy, GAVE, normal duodenum. 2 years screening.   Chronic SOB. No abdominal pain. No N/V. No GERD. No dysphagia. No peripheral edema. No confusion or mental status changes. No overt GI bleeding.   Down to 1-2 cigarettes a day. In the past would smoke 1.5 packs a day.    Past Medical History:  Diagnosis Date  . Anxiety   . Arthritis   . Cancer ([place])   . Cirrhosis ([place])    confirmed by MRI on 07/04/11.    Marland Kitchen COPD (chronic obstructive pulmonary disease) ([place])   . Depression with anxiety   . ETOH abuse   . Hyperlipidemia   . Hypertension    diet control  . Nodule of upper lobe of right lung    with mediastinal adenopathy  . Wears dentures   . Wears glasses     Past Surgical History:  Procedure Laterality Date  . bilateral cataract surgery     [place]  . broken arm  6 yrs ago   left otif of wrist-[place]  . CLOSED REDUCTION WRIST FRACTURE Right 09/02/2015   Procedure: RIGHT WRIST REDUCTION;  Surgeon: Renette Butters, MD;  Location: [facility];  Service: Orthopedics;  Laterality: Right;  with MAC  . COLONOSCOPY  1996   [place]: external hemorrhoids, no polyps  . COLONOSCOPY  06/28/2011   Dr. Ala Bent  diverticulosis,tubular adenoma, hyperplastic polyp  . COLONOSCOPY WITH PROPOFOL N/A 04/26/2017   Dr. Gala Romney: perianal and digital rectal examinations normal, diffusely congested colonic mucosa but otherwise normal  . ESOPHAGOGASTRODUODENOSCOPY  06/28/2011   Dr. Chelsea Aus erosive reflux esophagitis, hiatal hernia-gastritis  . ESOPHAGOGASTRODUODENOSCOPY (EGD) WITH PROPOFOL N/A 04/26/2017   Dr. Gala Romney: normal esophagus, small hiatal hernia, GAVE, portal hypertensive gastropathy, normal duodenal bulb and second portion, no specimens collected. 2 years screening   . ESOPHAGOGASTRODUODENOSCOPY (EGD) WITH PROPOFOL N/A 06/19/2019   normal esophagus, portal gastropathy, GAVE, normal duodenum. 2 years screening.   Marland Kitchen EXTERNAL EAR SURGERY     right ear-cleaned out ear and created new eardrum  . INGUINAL HERNIA REPAIR  2-3 yrs ago   left-[place]_  . INTRAMEDULLARY (IM) NAIL INTERTROCHANTERIC Right 09/02/2015   Procedure: RIGHT  INTERTROCHANTRIC HIP;  Surgeon: Renette Butters, MD;  Location: [facility];  Service: Orthopedics;  Laterality: Right;  With MAC  . KNEE SURGERY     left-arthroscopy-[place]  . PORTACATH PLACEMENT Left 06/08/2017   Procedure: INSERTION POWER PORT WITH  ATTACHED CATHETER LEFT SUBCLAVIAN;  Surgeon: Aviva Signs, MD;  Location: [facility];  Service: General;  Laterality: Left;  . SKULL FRACTURE ELEVATION     fractured cheeck bone repaired  . TOTAL HIP ARTHROPLASTY  12/18/2011   Procedure: TOTAL HIP ARTHROPLASTY;  Surgeon: Carole Civil, MD;  Location: [facility];  Service: Orthopedics;  Laterality: Left;  Marland Kitchen VIDEO BRONCHOSCOPY WITH ENDOBRONCHIAL ULTRASOUND N/A 05/04/2017   Procedure: VIDEO BRONCHOSCOPY WITH ENDOBRONCHIAL ULTRASOUND;  Surgeon: Melrose Nakayama, MD;  Location[facility];  Service: Thoracic;  Laterality: N/A;  . WISDOM TOOTH EXTRACTION      Current Outpatient Medications  Medication Sig Dispense Refill  . albuterol (VENTOLIN HFA) 108 (90 Base) MCG/ACT inhaler Inhale 2 puffs  into the lungs daily.     . cholecalciferol (VITAMIN D) 1000 units tablet Take 1,000 Units by mouth daily.    Marland Kitchen guaiFENesin (MUCINEX) 600 MG 12 hr tablet Take 600 mg by mouth as needed (congestion).     Marland Kitchen levothyroxine (SYNTHROID) 100 MCG tablet TAKE 1 TABLET(100 MCG) BY MOUTH DAILY BEFORE BREAKFAST (Patient taking differently: Take 100 mcg by mouth daily before breakfast. ) 90 tablet 1  . loratadine (CLARITIN) 10 MG tablet Take 10 mg by mouth daily.    . meclizine (ANTIVERT) 25 MG tablet Take 25 mg by mouth as needed for dizziness.   0  . Propylene Glycol-Glycerin (SOOTHE) 0.6-0.6 % SOLN Place 1-2 drops into both eyes as needed (for dry/irritated eyes.).     Marland Kitchen rosuvastatin (CRESTOR) 10 MG tablet Take 10 mg by mouth daily.    Marland Kitchen SPIRIVA HANDIHALER 18 MCG inhalation capsule Place 18 mcg into inhaler and inhale daily.   10   No current facility-administered medications for this visit.    Allergies as of 10/07/2019  . (No Known Allergies)    Family History  Problem Relation Age of Onset  . Stroke Mother 68  . Heart disease Father 58       MI  . Diabetes Maternal Uncle   . Colon cancer Neg Hx   . Cancer Neg Hx     Social History   Socioeconomic History  . Marital status: Married    Spouse name: Not on file  . Number of children: Not on file  . Years of education: Not on file  . Highest education level: Not on file  Occupational History  . Occupation: retired    Fish farm manager: [employer]  Tobacco Use  . Smoking status: Current Every Day Smoker    Packs/day: 0.10    Years: 62.00    Pack years: 6.20    Types: Cigarettes  . Smokeless tobacco: Never Used  . Tobacco comment: 1/2 ppd > 60 years as of 06/27/17: 4-5 cigarettes or less a day  Vaping Use  . Vaping Use: Never used  Substance and Sexual Activity  . Alcohol use: Not Currently    Comment: stopped 4 years ago  . Drug use: No  . Sexual activity: Yes    Birth control/protection: None  Other Topics Concern  .  Not on file  Social History Narrative  . Not on file   Social Determinants of Health   Financial Resource Strain:   . Difficulty of Paying Living Expenses:   Food Insecurity:   . Worried About Charity fundraiser in the Last Year:   . Arboriculturist in the Last Year:   Transportation Needs:   . Film/video editor (Medical):   Marland Kitchen Lack of Transportation (Non-Medical):   Physical Activity:   . Days of Exercise per Week:   . Minutes of Exercise per Session:   Stress:   . Feeling of Stress :   Social Connections:   . Frequency of Communication with Friends  and Family:   . Frequency of Social Gatherings with Friends and Family:   . Attends Religious Services:   . Active Member of Clubs or Organizations:   . Attends Archivist Meetings:   Marland Kitchen Marital Status:     Review of Systems: Gen: Denies fever, chills, anorexia. Denies fatigue, weakness, weight loss.  CV: Denies chest pain, palpitations, syncope, peripheral edema, and claudication. Resp: +DOE GI: see HPI Derm: Denies rash, itching, dry skin Psych: Denies depression, anxiety, memory loss, confusion. No homicidal or suicidal ideation.  Heme: Denies bruising, bleeding, and enlarged lymph nodes.  Physical Exam: BP (!) 161/78   Pulse 92   Temp (!) 97.3 F (36.3 C) (Oral)   Ht _0  (1.803 m)   Wt 176 lb 12.8 oz (80.2 kg)   BMI 24.66 kg/m  General:   Alert and oriented. No distress noted. Pleasant and cooperative.  Head:  Normocephalic and atraumatic. Eyes:  Conjuctiva clear without scleral icterus. Mouth:  Oral mucosa pink and moist.  Abdomen:  +BS, soft, non-tender and non-distended. Small umbilical hernia.  Msk:  Symmetrical without gross deformities. Normal posture. Extremities:  Without edema. Neurologic:  Alert and  oriented x4 Psych:  Alert and cooperative. Normal mood and affect.  ASSESSMENT: Ian Aguilar is a 79 y.o. male presenting today with a history of cirrhosis likely secondary to ETOH in  the past, history of pancreatic cysts in surveillance mode with next MRI due in April 2022, overall well-compensated. EGD recently completed, with surveillance due again in April 2023. No varices noted.   He is stable from a GI standpoint at this time, continues to deny ETOH use. No concerning signs/symptoms. Completed Hep A/B vaccinations. Discussed signs/symptoms that would necessitate urgent evaluation. We will see him in 6 months   PLAN:  [place] in Oct 2021 MRI pancreas April 2022 6 month return EGD in April 2023   Annitta Needs, PhD, ANP-BC [facility]

## 2019-10-07 ENCOUNTER — Encounter: Payer: Self-pay | Admitting: Gastroenterology

## 2019-10-07 ENCOUNTER — Ambulatory Visit (INDEPENDENT_AMBULATORY_CARE_PROVIDER_SITE_OTHER): Payer: PPO | Admitting: Gastroenterology

## 2019-10-07 ENCOUNTER — Other Ambulatory Visit: Payer: Self-pay

## 2019-10-07 VITALS — BP 161/78 | HR 92 | Temp 97.3°F | Ht 71.0 in | Wt 176.8 lb

## 2019-10-07 DIAGNOSIS — K7469 Other cirrhosis of liver: Secondary | ICD-10-CM | POA: Diagnosis not present

## 2019-10-07 DIAGNOSIS — R531 Weakness: Secondary | ICD-10-CM | POA: Diagnosis not present

## 2019-10-07 DIAGNOSIS — J449 Chronic obstructive pulmonary disease, unspecified: Secondary | ICD-10-CM | POA: Diagnosis not present

## 2019-10-07 DIAGNOSIS — C3490 Malignant neoplasm of unspecified part of unspecified bronchus or lung: Secondary | ICD-10-CM | POA: Diagnosis not present

## 2019-10-07 DIAGNOSIS — R6251 Failure to thrive (child): Secondary | ICD-10-CM | POA: Diagnosis not present

## 2019-10-07 NOTE — Patient Instructions (Signed)
We will see you in 6 months!  Your next [place] is in October 2021.   Please call if any bright red blood in stool, black tarry stool, abdominal pain, leg or abdomen swelling.  I enjoyed seeing you again today! As you know, I value our relationship and want to provide genuine, compassionate, and quality care. I welcome your feedback. If you receive a survey regarding your visit,  I greatly appreciate you taking time to fill this out. See you next time!  Annitta Needs, PhD, ANP-BC [facility]

## 2019-10-17 DIAGNOSIS — R059 Cough, unspecified: Secondary | ICD-10-CM | POA: Diagnosis not present

## 2019-10-18 DIAGNOSIS — J189 Pneumonia, unspecified organism: Secondary | ICD-10-CM | POA: Diagnosis not present

## 2019-10-20 DIAGNOSIS — R531 Weakness: Secondary | ICD-10-CM | POA: Diagnosis not present

## 2019-10-20 DIAGNOSIS — M25562 Pain in left knee: Secondary | ICD-10-CM | POA: Diagnosis not present

## 2019-10-20 DIAGNOSIS — J189 Pneumonia, unspecified organism: Secondary | ICD-10-CM | POA: Diagnosis not present

## 2019-10-20 DIAGNOSIS — R441 Visual hallucinations: Secondary | ICD-10-CM | POA: Diagnosis not present

## 2019-10-20 DIAGNOSIS — R52 Pain, unspecified: Secondary | ICD-10-CM | POA: Diagnosis not present

## 2019-10-20 DIAGNOSIS — G459 Transient cerebral ischemic attack, unspecified: Secondary | ICD-10-CM | POA: Diagnosis not present

## 2019-10-21 DIAGNOSIS — M6281 Muscle weakness (generalized): Secondary | ICD-10-CM | POA: Diagnosis not present

## 2019-10-22 ENCOUNTER — Inpatient Hospital Stay (HOSPITAL_COMMUNITY)
Admission: EM | Admit: 2019-10-22 | Discharge: 2019-10-25 | DRG: 190 | Disposition: A | Discharge status: Skilled Nursing Facility | Payer: PPO | Attending: Internal Medicine | Admitting: Internal Medicine

## 2019-10-22 ENCOUNTER — Emergency Department (HOSPITAL_COMMUNITY): Payer: PPO

## 2019-10-22 ENCOUNTER — Encounter (HOSPITAL_COMMUNITY): Payer: Self-pay

## 2019-10-22 ENCOUNTER — Other Ambulatory Visit: Payer: Self-pay

## 2019-10-22 DIAGNOSIS — R9082 White matter disease, unspecified: Secondary | ICD-10-CM | POA: Diagnosis not present

## 2019-10-22 DIAGNOSIS — Z9221 Personal history of antineoplastic chemotherapy: Secondary | ICD-10-CM

## 2019-10-22 DIAGNOSIS — G9340 Encephalopathy, unspecified: Secondary | ICD-10-CM

## 2019-10-22 DIAGNOSIS — C3411 Malignant neoplasm of upper lobe, right bronchus or lung: Secondary | ICD-10-CM | POA: Diagnosis not present

## 2019-10-22 DIAGNOSIS — F17210 Nicotine dependence, cigarettes, uncomplicated: Secondary | ICD-10-CM | POA: Diagnosis not present

## 2019-10-22 DIAGNOSIS — E039 Hypothyroidism, unspecified: Secondary | ICD-10-CM | POA: Diagnosis not present

## 2019-10-22 DIAGNOSIS — Z7189 Other specified counseling: Secondary | ICD-10-CM | POA: Diagnosis not present

## 2019-10-22 DIAGNOSIS — I10 Essential (primary) hypertension: Secondary | ICD-10-CM | POA: Diagnosis not present

## 2019-10-22 DIAGNOSIS — R2689 Other abnormalities of gait and mobility: Secondary | ICD-10-CM | POA: Diagnosis not present

## 2019-10-22 DIAGNOSIS — R05 Cough: Secondary | ICD-10-CM | POA: Diagnosis not present

## 2019-10-22 DIAGNOSIS — J441 Chronic obstructive pulmonary disease with (acute) exacerbation: Principal | ICD-10-CM | POA: Diagnosis present

## 2019-10-22 DIAGNOSIS — Z515 Encounter for palliative care: Secondary | ICD-10-CM

## 2019-10-22 DIAGNOSIS — K7030 Alcoholic cirrhosis of liver without ascites: Secondary | ICD-10-CM | POA: Diagnosis not present

## 2019-10-22 DIAGNOSIS — R4182 Altered mental status, unspecified: Secondary | ICD-10-CM | POA: Diagnosis not present

## 2019-10-22 DIAGNOSIS — Z79890 Hormone replacement therapy (postmenopausal): Secondary | ICD-10-CM

## 2019-10-22 DIAGNOSIS — R443 Hallucinations, unspecified: Secondary | ICD-10-CM | POA: Diagnosis not present

## 2019-10-22 DIAGNOSIS — Z923 Personal history of irradiation: Secondary | ICD-10-CM

## 2019-10-22 DIAGNOSIS — J9611 Chronic respiratory failure with hypoxia: Secondary | ICD-10-CM | POA: Diagnosis not present

## 2019-10-22 DIAGNOSIS — Z7951 Long term (current) use of inhaled steroids: Secondary | ICD-10-CM

## 2019-10-22 DIAGNOSIS — Z823 Family history of stroke: Secondary | ICD-10-CM

## 2019-10-22 DIAGNOSIS — Z96642 Presence of left artificial hip joint: Secondary | ICD-10-CM | POA: Diagnosis present

## 2019-10-22 DIAGNOSIS — G319 Degenerative disease of nervous system, unspecified: Secondary | ICD-10-CM | POA: Diagnosis not present

## 2019-10-22 DIAGNOSIS — I6389 Other cerebral infarction: Secondary | ICD-10-CM | POA: Diagnosis not present

## 2019-10-22 DIAGNOSIS — R4781 Slurred speech: Secondary | ICD-10-CM | POA: Diagnosis not present

## 2019-10-22 DIAGNOSIS — G9389 Other specified disorders of brain: Secondary | ICD-10-CM | POA: Diagnosis not present

## 2019-10-22 DIAGNOSIS — G9341 Metabolic encephalopathy: Secondary | ICD-10-CM | POA: Diagnosis present

## 2019-10-22 DIAGNOSIS — I6529 Occlusion and stenosis of unspecified carotid artery: Secondary | ICD-10-CM | POA: Diagnosis not present

## 2019-10-22 DIAGNOSIS — C3490 Malignant neoplasm of unspecified part of unspecified bronchus or lung: Secondary | ICD-10-CM | POA: Diagnosis not present

## 2019-10-22 DIAGNOSIS — I160 Hypertensive urgency: Secondary | ICD-10-CM

## 2019-10-22 DIAGNOSIS — J22 Unspecified acute lower respiratory infection: Secondary | ICD-10-CM | POA: Diagnosis not present

## 2019-10-22 DIAGNOSIS — Z833 Family history of diabetes mellitus: Secondary | ICD-10-CM | POA: Diagnosis not present

## 2019-10-22 DIAGNOSIS — H748X1 Other specified disorders of right middle ear and mastoid: Secondary | ICD-10-CM | POA: Diagnosis not present

## 2019-10-22 DIAGNOSIS — F418 Other specified anxiety disorders: Secondary | ICD-10-CM | POA: Diagnosis not present

## 2019-10-22 DIAGNOSIS — Z86010 Personal history of colonic polyps: Secondary | ICD-10-CM | POA: Diagnosis not present

## 2019-10-22 DIAGNOSIS — E860 Dehydration: Secondary | ICD-10-CM

## 2019-10-22 DIAGNOSIS — Z8249 Family history of ischemic heart disease and other diseases of the circulatory system: Secondary | ICD-10-CM

## 2019-10-22 DIAGNOSIS — Z20822 Contact with and (suspected) exposure to covid-19: Secondary | ICD-10-CM | POA: Diagnosis not present

## 2019-10-22 DIAGNOSIS — Z9981 Dependence on supplemental oxygen: Secondary | ICD-10-CM | POA: Diagnosis not present

## 2019-10-22 DIAGNOSIS — Z79899 Other long term (current) drug therapy: Secondary | ICD-10-CM

## 2019-10-22 DIAGNOSIS — R82998 Other abnormal findings in urine: Secondary | ICD-10-CM | POA: Diagnosis not present

## 2019-10-22 DIAGNOSIS — M6281 Muscle weakness (generalized): Secondary | ICD-10-CM | POA: Diagnosis not present

## 2019-10-22 DIAGNOSIS — R410 Disorientation, unspecified: Secondary | ICD-10-CM | POA: Diagnosis not present

## 2019-10-22 DIAGNOSIS — M1990 Unspecified osteoarthritis, unspecified site: Secondary | ICD-10-CM | POA: Diagnosis present

## 2019-10-22 DIAGNOSIS — Z85118 Personal history of other malignant neoplasm of bronchus and lung: Secondary | ICD-10-CM

## 2019-10-22 DIAGNOSIS — E785 Hyperlipidemia, unspecified: Secondary | ICD-10-CM | POA: Diagnosis present

## 2019-10-22 DIAGNOSIS — D0220 Carcinoma in situ of unspecified bronchus and lung: Secondary | ICD-10-CM | POA: Diagnosis not present

## 2019-10-22 DIAGNOSIS — I6782 Cerebral ischemia: Secondary | ICD-10-CM | POA: Diagnosis not present

## 2019-10-22 DIAGNOSIS — R6251 Failure to thrive (child): Secondary | ICD-10-CM | POA: Diagnosis not present

## 2019-10-22 DIAGNOSIS — R54 Age-related physical debility: Secondary | ICD-10-CM | POA: Diagnosis present

## 2019-10-22 DIAGNOSIS — R531 Weakness: Secondary | ICD-10-CM | POA: Diagnosis not present

## 2019-10-22 DIAGNOSIS — R0602 Shortness of breath: Secondary | ICD-10-CM | POA: Diagnosis not present

## 2019-10-22 DIAGNOSIS — J449 Chronic obstructive pulmonary disease, unspecified: Secondary | ICD-10-CM | POA: Diagnosis not present

## 2019-10-22 LAB — URINALYSIS, ROUTINE W REFLEX MICROSCOPIC
Bacteria, UA: NONE SEEN
Bilirubin Urine: NEGATIVE
Glucose, UA: NEGATIVE mg/dL
Ketones, ur: 5 mg/dL — AB
Leukocytes,Ua: NEGATIVE
Nitrite: NEGATIVE
Protein, ur: 100 mg/dL — AB
Specific Gravity, Urine: 1.03 (ref 1.005–1.030)
pH: 5 (ref 5.0–8.0)

## 2019-10-22 LAB — CBC
HCT: 41.8 % (ref 39.0–52.0)
Hemoglobin: 13.4 g/dL (ref 13.0–17.0)
MCH: 31.3 pg (ref 26.0–34.0)
MCHC: 32.1 g/dL (ref 30.0–36.0)
MCV: 97.7 fL (ref 80.0–100.0)
Platelets: 185 10*3/uL (ref 150–400)
RBC: 4.28 MIL/uL (ref 4.22–5.81)
RDW: 14.7 % (ref 11.5–15.5)
WBC: 5.4 10*3/uL (ref 4.0–10.5)
nRBC: 0 % (ref 0.0–0.2)

## 2019-10-22 LAB — BASIC METABOLIC PANEL
Anion gap: 11 (ref 5–15)
BUN: 32 mg/dL — ABNORMAL HIGH (ref 8–23)
CO2: 30 mmol/L (ref 22–32)
Calcium: 8.8 mg/dL — ABNORMAL LOW (ref 8.9–10.3)
Chloride: 97 mmol/L — ABNORMAL LOW (ref 98–111)
Creatinine, Ser: 1.02 mg/dL (ref 0.61–1.24)
GFR calc Af Amer: >60 mL/min (ref >60)
GFR calc non Af Amer: >60 mL/min (ref >60)
Glucose, Bld: 104 mg/dL — ABNORMAL HIGH (ref 70–99)
Potassium: 3.5 mmol/L (ref 3.5–5.1)
Sodium: 138 mmol/L (ref 135–145)

## 2019-10-22 LAB — TROPONIN I (HIGH SENSITIVITY): Troponin I (High Sensitivity): 11 ng/L (ref <18)

## 2019-10-22 LAB — SARS CORONAVIRUS 2 BY RT PCR (HOSPITAL ORDER, PERFORMED IN ~~LOC~~ HOSPITAL LAB): SARS Coronavirus 2: NEGATIVE

## 2019-10-22 LAB — BRAIN NATRIURETIC PEPTIDE: B Natriuretic Peptide: 133 pg/mL — ABNORMAL HIGH (ref 0.0–100.0)

## 2019-10-22 MED ORDER — DOXYCYCLINE HYCLATE 100 MG PO TABS
100.0000 mg | ORAL_TABLET | Freq: Two times a day (BID) | ORAL | Status: DC
  Administered 2019-10-23 – 2019-10-25 (×5): 100 mg via ORAL
  Filled 2019-10-22 (×5): qty 1

## 2019-10-22 MED ORDER — ROSUVASTATIN CALCIUM 10 MG PO TABS
10.0000 mg | ORAL_TABLET | Freq: Every day | ORAL | Status: DC
  Administered 2019-10-23 – 2019-10-25 (×3): 10 mg via ORAL
  Filled 2019-10-22 (×3): qty 1

## 2019-10-22 MED ORDER — ALBUTEROL SULFATE HFA 108 (90 BASE) MCG/ACT IN AERS
4.0000 | INHALATION_SPRAY | RESPIRATORY_TRACT | Status: AC
  Administered 2019-10-22 (×3): 4 via RESPIRATORY_TRACT
  Filled 2019-10-22: qty 6.7

## 2019-10-22 MED ORDER — ACETAMINOPHEN 650 MG RE SUPP: 650.0000 mg | Freq: Four times a day (QID) | RECTAL | Status: DC | PRN

## 2019-10-22 MED ORDER — IPRATROPIUM-ALBUTEROL 0.5-2.5 (3) MG/3ML IN SOLN: 3.0000 mL | Freq: Four times a day (QID) | RESPIRATORY_TRACT | Status: DC | PRN

## 2019-10-22 MED ORDER — PREDNISONE 20 MG PO TABS
40.0000 mg | ORAL_TABLET | Freq: Every day | ORAL | Status: DC
  Administered 2019-10-23: 40 mg via ORAL
  Filled 2019-10-22: qty 2

## 2019-10-22 MED ORDER — ACETAMINOPHEN 325 MG PO TABS: 650.0000 mg | ORAL_TABLET | Freq: Four times a day (QID) | ORAL | Status: DC | PRN

## 2019-10-22 MED ORDER — DOXYCYCLINE HYCLATE 100 MG PO TABS
100.0000 mg | ORAL_TABLET | Freq: Once | ORAL | Status: AC
  Administered 2019-10-22: 100 mg via ORAL
  Filled 2019-10-22: qty 1

## 2019-10-22 MED ORDER — DEXAMETHASONE SODIUM PHOSPHATE 10 MG/ML IJ SOLN
10.0000 mg | Freq: Once | INTRAMUSCULAR | Status: AC
  Administered 2019-10-22: 10 mg via INTRAVENOUS
  Filled 2019-10-22: qty 1

## 2019-10-22 MED ORDER — IPRATROPIUM-ALBUTEROL 0.5-2.5 (3) MG/3ML IN SOLN: 3.0000 mL | Freq: Three times a day (TID) | RESPIRATORY_TRACT | Status: DC

## 2019-10-22 MED ORDER — LEVOTHYROXINE SODIUM 50 MCG PO TABS
100.0000 ug | ORAL_TABLET | Freq: Every day | ORAL | Status: DC
  Administered 2019-10-23 – 2019-10-25 (×3): 100 ug via ORAL
  Filled 2019-10-22 (×3): qty 2

## 2019-10-22 MED ORDER — SODIUM CHLORIDE 0.9 % IV SOLN: INTRAVENOUS | Status: AC

## 2019-10-22 MED ORDER — VITAMIN D 25 MCG (1000 UNIT) PO TABS
1000.0000 [IU] | ORAL_TABLET | Freq: Every day | ORAL | Status: DC
  Administered 2019-10-23 – 2019-10-25 (×3): 1000 [IU] via ORAL
  Filled 2019-10-22 (×5): qty 1

## 2019-10-22 MED ORDER — ALBUTEROL (5 MG/ML) CONTINUOUS INHALATION SOLN
10.0000 mg/h | INHALATION_SOLUTION | Freq: Once | RESPIRATORY_TRACT | Status: AC
  Administered 2019-10-22: 10 mg/h via RESPIRATORY_TRACT
  Filled 2019-10-22: qty 20

## 2019-10-22 MED ORDER — ENOXAPARIN SODIUM 40 MG/0.4ML ~~LOC~~ SOLN
40.0000 mg | SUBCUTANEOUS | Status: DC
  Administered 2019-10-23 – 2019-10-25 (×3): 40 mg via SUBCUTANEOUS
  Filled 2019-10-22 (×3): qty 0.4

## 2019-10-22 MED ORDER — HYDRALAZINE HCL 20 MG/ML IJ SOLN
5.0000 mg | INTRAMUSCULAR | Status: DC | PRN
  Administered 2019-10-24 – 2019-10-25 (×2): 5 mg via INTRAVENOUS
  Filled 2019-10-22 (×2): qty 1

## 2019-10-22 NOTE — ED Triage Notes (Signed)
Pt to er, pt states that he is here for shortness of breath, pt states that he has been sob for the past 9 months.  Pt reports cough, states that it is a productive cough.

## 2019-10-22 NOTE — H&P (Signed)
History and Physical    Ian Aguilar KVQ:[id number] DOB: 1940-03-05 DOA: 10/22/2019  PCP: Sharilyn Sites, MD Patient coming from: Home  Chief Complaint: Shortness of breath, cough  HPI: Ian Aguilar is a 79 y.o. 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  Patient reports having cough and shortness of breath for the past few weeks.  Denies fevers or chest pain.  States he has been vaccinated against Covid.  Also reports having generalized weakness and a poor appetite for the past few days.  Denies nausea, vomiting, abdominal pain, diarrhea, or dysuria.  I spoke to the patient's son over the phone who informed me that the patient was last known well on Thursday last week, 8/19.  He normally uses a cane to walk.  On Friday patient was not able to ambulate with his cane and required assistance with a walker.  Son states he did not have any drooping of his face, slurring of speech, or weakness at that time.  The patient was confused and not wearing his home oxygen properly and son noticed that the oxygen tank was empty.  His oxygen saturation was 78%.  The tank was changed and oxygen saturation improved to 97%.  On Thursday home health did a chest x-ray and they were told that he had pneumonia in his left lower lobe.  He was prescribed doxycycline which he started taking on Saturday.  Son states on Saturday patient started hallucinating and now his speech was slurred.  He was sleeping a lot.  Home health came to assess the patient and did a physical exam, family was told everything was normal and no recommendation was made to come into the emergency room.  States by Sunday patient could not even use his walker and now  needed a wheelchair to ambulate.  Family called EMS and son states they did a physical exam and they were told that everything was normal.  Son is concerned that the patient has been very dehydrated as his urine is dark orange in color.  When he spoke to the patient's PCP today, he recommended bringing him into the emergency room to be evaluated.  ED Course: Afebrile.  Not tachycardic or tachypneic.  Blood pressure elevated with systolic in the 643P.  Satting well on 2 L home oxygen.  No leukocytosis.  BUN 32, creatinine 1.0.  BNP 133.  High-sensitivity troponin negative.  SARS-CoV-2 PCR test negative.  UA with small amount of ketones and protein; negative for signs of infection.  Chest x-ray showing stable right apical nodule.  No other focal abnormality.  Head CT negative for acute intracranial abnormality.  Patient received albuterol inhaler and continuous neb treatment, Decadron 10 mg, and doxycycline.  Review of Systems:  All systems reviewed and apart from history of presenting illness, are negative.  Past Medical History:  Diagnosis Date  . Anxiety   . Arthritis   . Cancer ([place])   . Cirrhosis ([place])    confirmed by MRI on 07/04/11.    Marland Kitchen COPD (chronic obstructive pulmonary disease) ([place])   . Depression with anxiety   . ETOH abuse   . Hyperlipidemia   . Hypertension    diet control  . Nodule of upper lobe of right lung    with mediastinal adenopathy  .  Wears dentures   . Wears glasses     Past Surgical History:  Procedure Laterality Date  . bilateral cataract surgery     [place]  . broken arm  6 yrs ago   left otif of wrist-[place]  . CLOSED REDUCTION WRIST FRACTURE Right 09/02/2015   Procedure: RIGHT WRIST REDUCTION;  Surgeon: Renette Butters, MD;  Location: [facility];  Service: Orthopedics;  Laterality: Right;  with MAC  . COLONOSCOPY  1996   [facility]: external hemorrhoids, no polyps  . COLONOSCOPY  06/28/2011   Dr. Ala Bent diverticulosis,tubular adenoma, hyperplastic  polyp  . COLONOSCOPY WITH PROPOFOL N/A 04/26/2017   Dr. Gala Romney: perianal and digital rectal examinations normal, diffusely congested colonic mucosa but otherwise normal  . ESOPHAGOGASTRODUODENOSCOPY  06/28/2011   Dr. Chelsea Aus erosive reflux esophagitis, hiatal hernia-gastritis  . ESOPHAGOGASTRODUODENOSCOPY (EGD) WITH PROPOFOL N/A 04/26/2017   Dr. Gala Romney: normal esophagus, small hiatal hernia, GAVE, portal hypertensive gastropathy, normal duodenal bulb and second portion, no specimens collected. 2 years screening   . ESOPHAGOGASTRODUODENOSCOPY (EGD) WITH PROPOFOL N/A 06/19/2019   normal esophagus, portal gastropathy, GAVE, normal duodenum. 2 years screening.   Marland Kitchen EXTERNAL EAR SURGERY     right ear-cleaned out ear and created new eardrum  . INGUINAL HERNIA REPAIR  2-3 yrs ago   left-[facility]_  . INTRAMEDULLARY (IM) NAIL INTERTROCHANTERIC Right 09/02/2015   Procedure: RIGHT  INTERTROCHANTRIC HIP;  Surgeon: Renette Butters, MD;  Location: [facility];  Service: Orthopedics;  Laterality: Right;  With MAC  . KNEE SURGERY     left-arthroscopy-[place]  . PORTACATH PLACEMENT Left 06/08/2017   Procedure: INSERTION POWER PORT WITH  ATTACHED CATHETER LEFT SUBCLAVIAN;  Surgeon: Aviva Signs, MD;  Location: [facility];  Service: General;  Laterality: Left;  . SKULL FRACTURE ELEVATION     fractured cheeck bone repaired  . TOTAL HIP ARTHROPLASTY  12/18/2011   Procedure: TOTAL HIP ARTHROPLASTY;  Surgeon: Carole Civil, MD;  Location: [facility];  Service: Orthopedics;  Laterality: Left;  Marland Kitchen VIDEO BRONCHOSCOPY WITH ENDOBRONCHIAL ULTRASOUND N/A 05/04/2017   Procedure: VIDEO BRONCHOSCOPY WITH ENDOBRONCHIAL ULTRASOUND;  Surgeon: Melrose Nakayama, MD;  Location: [facility];  Service: Thoracic;  Laterality: N/A;  . WISDOM TOOTH EXTRACTION       reports that he has been smoking cigarettes. He has a 6.20 pack-year smoking history. He has never used smokeless tobacco. He reports previous alcohol use. He reports that he does not  use drugs.  No Known Allergies  Family History  Problem Relation Age of Onset  . Stroke Mother 11  . Heart disease Father 12       MI  . Diabetes Maternal Uncle   . Colon cancer Neg Hx   . Cancer Neg Hx     Prior to Admission medications   Medication Sig Start Date End Date Taking? Authorizing Provider  albuterol (VENTOLIN HFA) 108 (90 Base) MCG/ACT inhaler Inhale 2 puffs into the lungs daily.  06/05/19  Yes [provider]  cholecalciferol (VITAMIN D) 1000 units tablet Take 1,000 Units by mouth daily.   Yes [provider]  guaiFENesin (MUCINEX) 600 MG 12 hr tablet Take 600 mg by mouth daily.    Yes [provider]  levothyroxine (SYNTHROID) 100 MCG tablet TAKE 1 TABLET(100 MCG) BY MOUTH DAILY BEFORE BREAKFAST Patient taking differently: Take 100 mcg by mouth daily before breakfast.  06/03/19  Yes Lockamy, Randi L, NP-C  meclizine (ANTIVERT) 25 MG tablet Take 25 mg by mouth as needed for dizziness.  12/07/17  Yes [provider]  Propylene Glycol-Glycerin (SOOTHE) 0.6-0.6 % SOLN Place 1-2 drops into both eyes as needed (for dry/irritated eyes.).    Yes [provider]  rosuvastatin (CRESTOR) 10 MG tablet Take 10 mg by mouth daily. 06/13/19  Yes [provider]  SPIRIVA HANDIHALER 18 MCG inhalation capsule Place 18 mcg into inhaler and inhale daily.  10/18/17  Yes [provider]    Physical Exam: Vitals:   10/22/19 2130 10/22/19 2300 10/22/19 2325 10/22/19 2354  BP: (!) 163/81 (!) 153/67 (!) 155/71 (!) 167/70  Pulse:  74 69 73  Resp: _0 Temp:   98.2 F (36.8 C) 97.9 F (36.6 C)  TempSrc:   Oral Oral  SpO2:  94% 95% 92%  Weight:      Height:        Physical Exam Constitutional:      General: He is not in acute distress. HENT:     Head: Normocephalic and atraumatic.     Mouth/Throat:     Mouth: Mucous membranes are dry.     Pharynx: Oropharynx is clear.  Eyes:     Extraocular Movements: Extraocular  movements intact.     Pupils: Pupils are equal, round, and reactive to light.  Cardiovascular:     Rate and Rhythm: Normal rate and regular rhythm.     Pulses: Normal pulses.  Pulmonary:     Effort: Pulmonary effort is normal. No respiratory distress.     Breath sounds: Normal breath sounds. No wheezing or rales.     Comments: Satting well on 2 L supplemental oxygen Abdominal:     General: Bowel sounds are normal.     Palpations: Abdomen is soft.     Tenderness: There is no abdominal tenderness. There is no guarding.  Musculoskeletal:        General: No swelling or tenderness.     Cervical back: Normal range of motion and neck supple.  Skin:    General: Skin is warm and dry.     Comments: Decreased skin turgor  Neurological:     General: No focal deficit present.     Mental Status: He is alert and oriented to person, place, and time.     Comments: Speech fluent, tongue midline, no facial droop. Strength 5 out of 5 in bilateral upper and lower extremities.  Sensation to light touch intact throughout.     Labs on Admission: I have personally reviewed following labs and imaging studies  CBC: Recent Labs  Lab 10/22/19 1547  WBC 5.4  HGB 13.4  HCT 41.8  MCV 97.7  PLT 326   Basic Metabolic Panel: Recent Labs  Lab 10/22/19 1547  NA 138  K 3.5  CL 97*  CO2 30  GLUCOSE 104*  BUN 32*  CREATININE 1.02  CALCIUM 8.8*   GFR: Estimated Creatinine Clearance: 63.6 mL/min (by C-G formula based on SCr of 1.02 mg/dL). Liver Function Tests: No results for input(s): AST, ALT, ALKPHOS, BILITOT, PROT, ALBUMIN in the last 168 hours. No results for input(s): LIPASE, AMYLASE in the last 168 hours. No results for input(s): AMMONIA in the last 168 hours. Coagulation Profile: No results for input(s): INR, PROTIME in the last 168 hours. Cardiac Enzymes: No results for input(s): CKTOTAL, CKMB, CKMBINDEX, TROPONINI in the last 168 hours. BNP (last 3 results) No results for input(s):  PROBNP in the last 8760 hours. HbA1C: No results for input(s): HGBA1C in the last 72 hours. CBG: No  results for input(s): GLUCAP in the last 168 hours. Lipid Profile: No results for input(s): CHOL, HDL, LDLCALC, TRIG, CHOLHDL, LDLDIRECT in the last 72 hours. Thyroid Function Tests: No results for input(s): TSH, T4TOTAL, FREET4, T3FREE, THYROIDAB in the last 72 hours. Anemia Panel: No results for input(s): VITAMINB12, FOLATE, FERRITIN, TIBC, IRON, RETICCTPCT in the last 72 hours. Urine analysis:    Component Value Date/Time   COLORURINE YELLOW 10/22/2019 2111   APPEARANCEUR CLEAR 10/22/2019 2111   LABSPEC 1.030 10/22/2019 2111   PHURINE 5.0 10/22/2019 2111   GLUCOSEU NEGATIVE 10/22/2019 2111   HGBUR SMALL (A) 10/22/2019 2111   [place] NEGATIVE 10/22/2019 2111   KETONESUR 5 (A) 10/22/2019 2111   PROTEINUR 100 (A) 10/22/2019 2111   NITRITE NEGATIVE 10/22/2019 2111   LEUKOCYTESUR NEGATIVE 10/22/2019 2111    Radiological Exams on Admission: CT Head Wo Contrast  Result Date: 10/22/2019 CLINICAL DATA:  Mental status change EXAM: CT HEAD WITHOUT CONTRAST TECHNIQUE: Contiguous axial images were obtained from the base of the skull through the vertex without intravenous contrast. COMPARISON:  MRI 04/29/2018 FINDINGS: Brain: No acute territorial infarction, hemorrhage or intracranial mass. Mild to moderate atrophy. Mild hypodensity in the white matter consistent with chronic small vessel ischemic change. Nonenlarged ventricles Vascular: No hyperdense vessels.  Carotid vascular calcification Skull: Normal. Negative for fracture or focal lesion. Sinuses/Orbits: No acute finding. Other: None IMPRESSION: 1. No CT evidence for acute intracranial abnormality. 2. Atrophy and mild chronic small vessel ischemic changes of the white matter. Electronically Signed   By: Donavan Foil M.D.   On: 10/22/2019 21:41   DG Chest Port 1 View  Result Date: 10/22/2019 CLINICAL DATA:  Cough and shortness of  breath EXAM: PORTABLE CHEST 1 VIEW COMPARISON:  06/16/2019 FINDINGS: Cardiac shadow is within normal limits. The lungs are well aerated bilaterally. Medial right apical nodule is noted stable from the prior CT examination. No focal infiltrate or effusion is seen. No bony abnormality is noted. Left chest wall port is noted satisfactory position. Aortic calcifications are noted. IMPRESSION: Stable right apical nodule. No other focal abnormality is seen. Electronically Signed   By: Inez Catalina M.D.   On: 10/22/2019 17:13    Assessment/Plan Principal Problem:   Acute encephalopathy Active Problems:   Dehydration   COPD with acute exacerbation (HCC)   Chronic hypoxemic respiratory failure (HCC)   Hypertensive urgency   Acute encephalopathy: Symptom onset 5 days ago.  Likely multifactorial due to dehydration and hypoxia.  Son reports that he has not been wearing his nasal cannula properly and at one point his oxygen tank was found to be empty.  No focal neuro deficit on exam.  Head CT negative for acute intracranial abnormality.  Chest x-ray not suggestive of pneumonia.  UA not suggestive of infection.  No leukocytosis on labs.  Currently AAO x3 and answering questions appropriately. -Brain MRI.  Check B12, TSH, and ammonia levels.  IV fluid hydration.  Dehydration: Appears dehydrated on exam with dry mucous membranes and decreased skin turgor.  BUN elevated on labs and UA with some ketones. -IV fluid hydration  Acute COPD exacerbation: Presenting with complaints of shortness of breath and cough.  Received continuous albuterol nebulizer treatment, Decadron, and doxycycline in the ED.  Lungs currently clear on exam-no wheezing, rhonchi, or accessory muscle use. -Continue doxycycline, Mucinex DM, DuoNeb, and start prednisone 40 mg daily in a.m. Check procalcitonin level.  Chronic hypoxemic respiratory failure: Stable.  Satting well on 2 L home oxygen. -Continuous pulse ox,  supplemental oxygen  Stage  II right upper lobe adenocarcinoma: Status post chemo and radiation.  Followed by oncology.  Chest x-ray showing stable right apical nodule. -Please ensure outpatient oncology follow-up  Hypertensive urgency: Upon arrival to the ED, blood pressure elevated with systolic in the 578I.  He did not receive any antihypertensives.  Blood pressure currently improved with systolic in the 696E.  No antihypertensives listed on home medications. -Hydralazine PRN SBP >160  Hyperlipidemia -Continue Crestor  Hypothyroidism -Continue Synthroid.  Check TSH level.  Liver cirrhosis: Followed by GI and cirrhosis felt to be secondary to alcohol use in the past.  Appears well compensated at this time. -Check ammonia level.  Please ensure GI follow-up.  Generalized weakness -PT/OT eval  DVT prophylaxis: Lovenox Code Status: Full code-discussed with the patient and his son. Family Communication: Son updated over the phone. Disposition Plan: Status is: Observation  The patient remains OBS appropriate and will d/c before 2 midnights.  Dispo: The patient is from: Home              Anticipated d/c is to: SNF              Anticipated d/c date is: 2 days              Patient currently is not medically stable to d/c.  The medical decision making on this patient was of high complexity and the patient is at high risk for clinical deterioration, therefore this is a level 3 visit.  Shela Leff MD [facility]  If 7PM-7AM, please contact night-coverage [email or web address]  10/23/2019, 12:03 AM

## 2019-10-22 NOTE — ED Triage Notes (Signed)
Pt presents on 2L O2 via [place]

## 2019-10-22 NOTE — ED Provider Notes (Addendum)
[facility] Provider Note   CSN: [id number] Arrival date & time: 10/22/19  1512     History Chief Complaint  Patient presents with  . Shortness of Breath    Ian Aguilar is a 79 y.o. male.  HPI     79 year old male comes in a chief complaint of shortness of breath.  He has history of liver cirrhosis, COPD, hypertension, hyperlipidemia.  Patient reports that his been having shortness of breath over the last several weeks, however over the last few days it has gotten worse.  Patient gets short of breath with exertion.  He is having no chest discomfort with it.  Patient is having a cough, with clear phlegm.  He denies any nausea, vomiting, fevers, chills, body aches.  No sick exposures.  There is no history of PE, DVT.  Patient has not had any wheezing.  He is not currently taking any breathing treatments.  He is typically on as needed oxygen, but feels like he is requiring more of it recently.  Past Medical History:  Diagnosis Date  . Anxiety   . Arthritis   . Cancer ([place])   . Cirrhosis ([place])    confirmed by MRI on 07/04/11.    Marland Kitchen COPD (chronic obstructive pulmonary disease) ([place])   . Depression with anxiety   . ETOH abuse   . Hyperlipidemia   . Hypertension    diet control  . Nodule of upper lobe of right lung    with mediastinal adenopathy  . Wears dentures   . Wears glasses     Patient Active Problem List   Diagnosis Date Noted  . Acute encephalopathy 10/23/2019  . Dehydration 10/23/2019  . COPD with acute exacerbation ([place]) 10/23/2019  . Chronic hypoxemic respiratory failure ([place]) 10/23/2019  . Hypertensive urgency 10/23/2019  . Goals of care, counseling/discussion   . Palliative care by specialist   . DNR (do not resuscitate) discussion   . Malignant neoplasm of bronchus and lung ([place])   . Carcinoma, lung ([place]) 06/05/2017  . Hepatic cirrhosis ([place]) 04/06/2017  . History of colonic polyps 04/06/2017  . History of snoring 09/07/2015  .  Fracture of ulnar styloid   . Benign essential HTN   . Faintness   . Thrombocytopenia ([place]) 09/03/2015  . Hyponatremia 09/03/2015  . Closed displaced intertrochanteric fracture of right femur ([place])   . Distal radius fracture, right 09/02/2015  . Fracture of right hip requiring operative repair ([place]) 09/01/2015  . Alcohol abuse 09/01/2015  . Tobacco abuse 09/01/2015  . COPD (chronic obstructive pulmonary disease) 09/01/2015  . 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 Weakness of left leg 01/10/2012  . Difficulty in walking(719.7) 01/10/2012  . Pain in left hip 01/10/2012  . Acute blood loss anemia 12/21/2011  . Cirrhosis- imaging diagnosis 2013 09/08/2011  . Umbilical hernia 40/98/1191  . 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  . Hepatomegaly 06/14/2011  . Encounter for screening colonoscopy 06/14/2011  . JOINT EFFUSION, KNEE 02/10/2008  . KNEE PAIN 02/10/2008    Past Surgical History:  Procedure Laterality Date  . bilateral cataract surgery     [place]  . broken arm  6 yrs ago   left otif of wrist-[place]  . CLOSED REDUCTION WRIST FRACTURE Right 09/02/2015   Procedure: RIGHT WRIST REDUCTION;  Surgeon: Renette Butters, MD;  Location: [facility];  Service: Orthopedics;  Laterality: Right;  with MAC  . COLONOSCOPY  1996   [place]: external hemorrhoids, no polyps  . COLONOSCOPY  06/28/2011  Dr. Ala Bent diverticulosis,tubular adenoma, hyperplastic polyp  . COLONOSCOPY WITH PROPOFOL N/A 04/26/2017   Dr. Gala Romney: perianal and digital rectal examinations normal, diffusely congested colonic mucosa but otherwise normal  . ESOPHAGOGASTRODUODENOSCOPY  06/28/2011   Dr. Chelsea Aus erosive reflux esophagitis, hiatal hernia-gastritis  . ESOPHAGOGASTRODUODENOSCOPY (EGD) WITH PROPOFOL N/A 04/26/2017   Dr. Gala Romney: normal esophagus, small hiatal hernia, GAVE, portal hypertensive gastropathy, normal duodenal bulb and second portion, no specimens collected. 2 years screening   . ESOPHAGOGASTRODUODENOSCOPY  (EGD) WITH PROPOFOL N/A 06/19/2019   normal esophagus, portal gastropathy, GAVE, normal duodenum. 2 years screening.   Marland Kitchen EXTERNAL EAR SURGERY     right ear-cleaned out ear and created new eardrum  . INGUINAL HERNIA REPAIR  2-3 yrs ago   left-[place]_  . INTRAMEDULLARY (IM) NAIL INTERTROCHANTERIC Right 09/02/2015   Procedure: RIGHT  INTERTROCHANTRIC HIP;  Surgeon: Renette Butters, MD;  Location: [facility];  Service: Orthopedics;  Laterality: Right;  With MAC  . KNEE SURGERY     left-arthroscopy-[place]  . PORTACATH PLACEMENT Left 06/08/2017   Procedure: INSERTION POWER PORT WITH  ATTACHED CATHETER LEFT SUBCLAVIAN;  Surgeon: Aviva Signs, MD;  Location: [facility];  Service: General;  Laterality: Left;  . SKULL FRACTURE ELEVATION     fractured cheeck bone repaired  . TOTAL HIP ARTHROPLASTY  12/18/2011   Procedure: TOTAL HIP ARTHROPLASTY;  Surgeon: Carole Civil, MD;  Location: [facility];  Service: Orthopedics;  Laterality: Left;  Marland Kitchen VIDEO BRONCHOSCOPY WITH ENDOBRONCHIAL ULTRASOUND N/A 05/04/2017   Procedure: VIDEO BRONCHOSCOPY WITH ENDOBRONCHIAL ULTRASOUND;  Surgeon: Melrose Nakayama, MD;  Location: [facility];  Service: Thoracic;  Laterality: N/A;  . WISDOM TOOTH EXTRACTION         Family History  Problem Relation Age of Onset  . Stroke Mother 65  . Heart disease Father 5       MI  . Diabetes Maternal Uncle   . Colon cancer Neg Hx   . Cancer Neg Hx     Social History   Tobacco Use  . Smoking status: Current Every Day Smoker    Packs/day: 0.10    Years: 62.00    Pack years: 6.20    Types: Cigarettes  . Smokeless tobacco: Never Used  . Tobacco comment: 1/2 ppd > 60 years as of 06/27/17: 4-5 cigarettes or less a day  Vaping Use  . Vaping Use: Never used  Substance Use Topics  . Alcohol use: Not Currently    Comment: stopped 4 years ago  . Drug use: No    Home Medications Prior to Admission medications   Medication Sig Start Date End Date Taking? Authorizing Provider    albuterol (VENTOLIN HFA) 108 (90 Base) MCG/ACT inhaler Inhale 2 puffs into the lungs daily.  06/05/19  Yes [provider]  cholecalciferol (VITAMIN D) 1000 units tablet Take 1,000 Units by mouth daily.   Yes [provider]  guaiFENesin (MUCINEX) 600 MG 12 hr tablet Take 600 mg by mouth daily.    Yes [provider]  levothyroxine (SYNTHROID) 100 MCG tablet TAKE 1 TABLET(100 MCG) BY MOUTH DAILY BEFORE BREAKFAST Patient taking differently: Take 100 mcg by mouth daily before breakfast.  06/03/19  Yes Lockamy, Randi L, NP-C  meclizine (ANTIVERT) 25 MG tablet Take 25 mg by mouth as needed for dizziness.  12/07/17  Yes [provider]  Propylene Glycol-Glycerin (SOOTHE) 0.6-0.6 % SOLN Place 1-2 drops into both eyes as needed (for dry/irritated eyes.).    Yes [provider]  rosuvastatin (  CRESTOR) 10 MG tablet Take 10 mg by mouth daily. 06/13/19  Yes [provider]  SPIRIVA HANDIHALER 18 MCG inhalation capsule Place 18 mcg into inhaler and inhale daily.  10/18/17  Yes [provider]    Allergies    Patient has no known allergies.  Review of Systems   Review of Systems  Constitutional: Positive for activity change.  Respiratory: Positive for cough and shortness of breath.   Cardiovascular: Negative for chest pain.  Gastrointestinal: Negative for nausea and vomiting.  Allergic/Immunologic: Negative for immunocompromised state.  Hematological: Does not bruise/bleed easily.  All other systems reviewed and are negative.   Physical Exam Updated Vital Signs BP (!) 164/72 (BP Location: Right Arm)   Pulse 70   Temp 99.4 F (37.4 C) (Oral)   Resp 18   Ht _0  (1.803 m)   Wt 76.2 kg   SpO2 95%   BMI 23.43 kg/m   Physical Exam Vitals and nursing note reviewed.  Constitutional:      Appearance: He is well-developed.  HENT:     Head: Atraumatic.  Neck:     Vascular: No JVD.  Cardiovascular:     Rate and Rhythm: Normal rate.   Pulmonary:     Effort: Pulmonary effort is normal. No accessory muscle usage.     Breath sounds: No stridor. No decreased breath sounds, wheezing, rhonchi or rales.  Musculoskeletal:     Cervical back: Neck supple.     Right lower leg: No edema.     Left lower leg: No edema.  Skin:    General: Skin is warm.  Neurological:     Mental Status: He is alert and oriented to person, place, and time.     ED Results / Procedures / Treatments   Labs (all labs ordered are listed, but only abnormal results are displayed) Labs Reviewed  BASIC METABOLIC PANEL - Abnormal; Notable for the following components:      Result Value   Chloride 97 (*)    Glucose, Bld 104 (*)    BUN 32 (*)    Calcium 8.8 (*)    All other components within normal limits  BRAIN NATRIURETIC PEPTIDE - Abnormal; Notable for the following components:   B Natriuretic Peptide 133.0 (*)    All other components within normal limits  URINALYSIS, ROUTINE W REFLEX MICROSCOPIC - Abnormal; Notable for the following components:   Hgb urine dipstick SMALL (*)    Ketones, ur 5 (*)    Protein, ur 100 (*)    All other components within normal limits  SARS CORONAVIRUS 2 BY RT PCR (HOSPITAL ORDER, [place] [facility])  CBC  VITAMIN B12  TSH  AMMONIA  PROCALCITONIN  TROPONIN I (HIGH SENSITIVITY)    EKG None  Radiology CT Head Wo Contrast  Result Date: 10/22/2019 CLINICAL DATA:  Mental status change EXAM: CT HEAD WITHOUT CONTRAST TECHNIQUE: Contiguous axial images were obtained from the base of the skull through the vertex without intravenous contrast. COMPARISON:  MRI 04/29/2018 FINDINGS: Brain: No acute territorial infarction, hemorrhage or intracranial mass. Mild to moderate atrophy. Mild hypodensity in the white matter consistent with chronic small vessel ischemic change. Nonenlarged ventricles Vascular: No hyperdense vessels.  Carotid vascular calcification Skull: Normal. Negative for fracture or focal  lesion. Sinuses/Orbits: No acute finding. Other: None IMPRESSION: 1. No CT evidence for acute intracranial abnormality. 2. Atrophy and mild chronic small vessel ischemic changes of the white matter. Electronically Signed   By: Maudie Mercury  Francoise Ceo M.D.   On: 10/22/2019 21:41   DG Chest Port 1 View  Result Date: 10/22/2019 CLINICAL DATA:  Cough and shortness of breath EXAM: PORTABLE CHEST 1 VIEW COMPARISON:  06/16/2019 FINDINGS: Cardiac shadow is within normal limits. The lungs are well aerated bilaterally. Medial right apical nodule is noted stable from the prior CT examination. No focal infiltrate or effusion is seen. No bony abnormality is noted. Left chest wall port is noted satisfactory position. Aortic calcifications are noted. IMPRESSION: Stable right apical nodule. No other focal abnormality is seen. Electronically Signed   By: Inez Catalina M.D.   On: 10/22/2019 17:13    Procedures .Critical Care Performed by: Varney Biles, MD Authorized by: Varney Biles, MD   Critical care provider statement:    Critical care time (minutes):  62   Critical care was necessary to treat or prevent imminent or life-threatening deterioration of the following conditions:  CNS failure or compromise   Critical care was time spent personally by me on the following activities:  Discussions with consultants, evaluation of patient's response to treatment, examination of patient, ordering and performing treatments and interventions, ordering and review of laboratory studies, ordering and review of radiographic studies, pulse oximetry, re-evaluation of patient's condition, obtaining history from patient or surrogate and review of old charts   (including critical care time)  Medications Ordered in ED Medications  rosuvastatin (CRESTOR) tablet 10 mg (10 mg Oral Given 10/23/19 0903)  levothyroxine (SYNTHROID) tablet 100 mcg (100 mcg Oral Given 10/23/19 0536)  cholecalciferol (VITAMIN D3) tablet 1,000 Units (1,000 Units  Oral Given 10/23/19 0903)  enoxaparin (LOVENOX) injection 40 mg (40 mg Subcutaneous Given 10/23/19 0902)  acetaminophen (TYLENOL) tablet 650 mg (has no administration in time range)    Or  acetaminophen (TYLENOL) suppository 650 mg (has no administration in time range)  doxycycline (VIBRA-TABS) tablet 100 mg (100 mg Oral Given 10/23/19 0903)  0.9 %  sodium chloride infusion ( Intravenous New Bag/Given 10/23/19 0020)  hydrALAZINE (APRESOLINE) injection 5 mg (has no administration in time range)  methylPREDNISolone sodium succinate (SOLU-MEDROL) 40 mg/mL injection 40 mg (has no administration in time range)  ipratropium-albuterol (DUONEB) 0.5-2.5 (3) MG/3ML nebulizer solution 3 mL (3 mLs Nebulization Given 10/23/19 1308)  albuterol (PROVENTIL) (2.5 MG/3ML) 0.083% nebulizer solution 2.5 mg (has no administration in time range)  mometasone-formoterol (DULERA) 100-5 MCG/ACT inhaler 2 puff (has no administration in time range)  Chlorhexidine Gluconate Cloth 2 % PADS 6 each (has no administration in time range)  albuterol (VENTOLIN HFA) 108 (90 Base) MCG/ACT inhaler 4 puff (4 puffs Inhalation Given 10/22/19 1733)  albuterol (PROVENTIL,VENTOLIN) solution continuous neb (10 mg/hr Nebulization Given 10/22/19 2022)  dexamethasone (DECADRON) injection 10 mg (10 mg Intravenous Given 10/22/19 2055)  doxycycline (VIBRA-TABS) tablet 100 mg (100 mg Oral Given 10/22/19 2058)    ED Course  I have reviewed the triage vital signs and the nursing notes.  Pertinent labs & imaging results that were available during my care of the patient were reviewed by me and considered in my medical decision making (see chart for details).    MDM Rules/Calculators/A&P                         INES WARF was evaluated in Emergency Department on 10/23/2019 for the symptoms described in the history of present illness. He was evaluated in the context of the global COVID-19 pandemic, which necessitated consideration that the patient  might be at risk for  infection with the SARS-CoV-2 virus that causes COVID-19. Institutional protocols and algorithms that pertain to the evaluation of patients at risk for COVID-19 are in a state of rapid change based on information released by regulatory bodies including the CDC and federal and state organizations. These policies and algorithms were followed during the patient's care in the ED.    79 year old comes in a chief complaint of shortness of breath.  Patient has multiple medical comorbidities and reports feeling short of breath for the last several weeks, however it has gotten worse more recently.  On exam there is no focal cardiopulmonary abnormalities.  Differential diagnosis includes COVID-19, pleural effusion, CHF, COPD exacerbation, pneumonia, ACS.  0 SIRS criteria at arrival.  Reassessment: Later into patient's stay I heard from patient's son.  He reports that over the last 5 days patient has been more confused.  He has had some falls.  Patient forgets trivial tasks like how to put the mask on properly.  He is also been having a lot of hallucination.  Patient has had multiple EMS visits to his home.  Finally they have decided to bring him in.  Patient lives in a triplex and was able to climb up and down their house just 5 days ago.  We will proceed with admission.  It does not appear that patient has any hypercapnia, likely prolonged hypoxia might have caused some AMS and delirium.  Stroke is a possibility.  Discussed with the hospitalist that we would prefer admitting the patient and if he does not improve overnight then to consider neuro consult and MRI.  Final Clinical Impression(s) / ED Diagnoses Final diagnoses:  Disorientation  COPD exacerbation [facility])    Rx / [place] Orders ED Discharge Orders    None       Varney Biles, MD 10/22/19 [place], Dierra Riesgo, MD 10/23/19 [phone]

## 2019-10-22 NOTE — ED Notes (Signed)
Pt in bed, pt states that he is breathing better, pt has improved breath sounds, pt continues to have expiratory wheeze.

## 2019-10-23 ENCOUNTER — Encounter (HOSPITAL_COMMUNITY): Payer: Self-pay | Admitting: Internal Medicine

## 2019-10-23 ENCOUNTER — Observation Stay (HOSPITAL_COMMUNITY): Payer: PPO

## 2019-10-23 DIAGNOSIS — G9340 Encephalopathy, unspecified: Secondary | ICD-10-CM

## 2019-10-23 DIAGNOSIS — Z7189 Other specified counseling: Secondary | ICD-10-CM

## 2019-10-23 DIAGNOSIS — H748X1 Other specified disorders of right middle ear and mastoid: Secondary | ICD-10-CM | POA: Diagnosis not present

## 2019-10-23 DIAGNOSIS — J441 Chronic obstructive pulmonary disease with (acute) exacerbation: Principal | ICD-10-CM

## 2019-10-23 DIAGNOSIS — E860 Dehydration: Secondary | ICD-10-CM

## 2019-10-23 DIAGNOSIS — Z515 Encounter for palliative care: Secondary | ICD-10-CM

## 2019-10-23 DIAGNOSIS — G9389 Other specified disorders of brain: Secondary | ICD-10-CM | POA: Diagnosis not present

## 2019-10-23 DIAGNOSIS — I160 Hypertensive urgency: Secondary | ICD-10-CM

## 2019-10-23 DIAGNOSIS — R9082 White matter disease, unspecified: Secondary | ICD-10-CM | POA: Diagnosis not present

## 2019-10-23 DIAGNOSIS — I6389 Other cerebral infarction: Secondary | ICD-10-CM | POA: Diagnosis not present

## 2019-10-23 DIAGNOSIS — J9611 Chronic respiratory failure with hypoxia: Secondary | ICD-10-CM

## 2019-10-23 LAB — AMMONIA: Ammonia: 25 umol/L (ref 9–35)

## 2019-10-23 LAB — VITAMIN B12: Vitamin B-12: 347 pg/mL (ref 180–914)

## 2019-10-23 LAB — PROCALCITONIN: Procalcitonin: <0.1 ng/mL

## 2019-10-23 LAB — TSH: TSH: 1.653 u[IU]/mL (ref 0.350–4.500)

## 2019-10-23 MED ORDER — MELATONIN 3 MG PO TABS
6.0000 mg | ORAL_TABLET | Freq: Every evening | ORAL | Status: DC | PRN
  Administered 2019-10-24: 6 mg via ORAL
  Filled 2019-10-23: qty 2

## 2019-10-23 MED ORDER — METHYLPREDNISOLONE SODIUM SUCC 40 MG IJ SOLR
40.0000 mg | Freq: Three times a day (TID) | INTRAMUSCULAR | Status: DC
  Administered 2019-10-23 – 2019-10-24 (×3): 40 mg via INTRAVENOUS
  Filled 2019-10-23 (×3): qty 1

## 2019-10-23 MED ORDER — CHLORHEXIDINE GLUCONATE CLOTH 2 % EX PADS
6.0000 | MEDICATED_PAD | Freq: Every day | CUTANEOUS | Status: DC
  Administered 2019-10-23 – 2019-10-25 (×3): 6 via TOPICAL

## 2019-10-23 MED ORDER — LORAZEPAM 2 MG/ML IJ SOLN
1.0000 mg | Freq: Once | INTRAMUSCULAR | Status: AC
  Administered 2019-10-23: 1 mg via INTRAVENOUS
  Filled 2019-10-23: qty 1

## 2019-10-23 MED ORDER — MOMETASONE FURO-FORMOTEROL FUM 100-5 MCG/ACT IN AERO: 2.0000 | INHALATION_SPRAY | Freq: Two times a day (BID) | RESPIRATORY_TRACT | Status: DC

## 2019-10-23 MED ORDER — MOMETASONE FURO-FORMOTEROL FUM 100-5 MCG/ACT IN AERO
2.0000 | INHALATION_SPRAY | Freq: Two times a day (BID) | RESPIRATORY_TRACT | Status: DC
  Administered 2019-10-23 – 2019-10-25 (×4): 2 via RESPIRATORY_TRACT
  Filled 2019-10-23: qty 8.8

## 2019-10-23 MED ORDER — IPRATROPIUM-ALBUTEROL 0.5-2.5 (3) MG/3ML IN SOLN
3.0000 mL | Freq: Four times a day (QID) | RESPIRATORY_TRACT | Status: DC
  Administered 2019-10-23 – 2019-10-25 (×7): 3 mL via RESPIRATORY_TRACT
  Filled 2019-10-23 (×7): qty 3

## 2019-10-23 MED ORDER — MELATONIN 5 MG PO TABS
5.0000 mg | ORAL_TABLET | Freq: Every evening | ORAL | Status: DC | PRN
  Filled 2019-10-23: qty 1

## 2019-10-23 MED ORDER — ALBUTEROL SULFATE (2.5 MG/3ML) 0.083% IN NEBU: 2.5000 mg | INHALATION_SOLUTION | RESPIRATORY_TRACT | Status: DC | PRN

## 2019-10-23 NOTE — Evaluation (Signed)
Physical Therapy Evaluation Patient Details Name: Ian Aguilar MRN: [id number] DOB: 08/02/1940 Today's Date: 10/23/2019   History of Present Illness  Ian Aguilar is a 79 y.o. 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  Patient reports having cough and shortness of breath for the past few weeks.  Denies fevers or chest pain.  States he has been vaccinated against Covid.  Also reports having generalized weakness and a poor appetite for the past few days.  Denies nausea, vomiting, abdominal pain, diarrhea, or dysuria.    Clinical Impression  Patient demonstrates labored movement for sitting up at bedside, had near loss of balance during sit to stands, slow labored unsteady cadence limited mostly due to SOB and fatigue.  Patient tolerated sitting up in chair after therapy.  Patient will benefit from continued physical therapy in hospital and recommended venue below to increase strength, balance, endurance for safe ADLs and gait.     Follow Up Recommendations SNF;Supervision for mobility/OOB;Supervision - Intermittent    Equipment Recommendations  None recommended by PT    Recommendations for Other Services       Precautions / Restrictions Precautions Precautions: Fall Restrictions Weight Bearing Restrictions: No      Mobility  Bed Mobility Overal bed mobility: Needs Assistance Bed Mobility: Supine to Sit     Supine to sit: Min guard;Supervision     General bed mobility comments: labored movement  Transfers Overall transfer level: Needs assistance Equipment used: Rolling walker (2 wheeled) Transfers: Sit to/from [facility] Sit to Stand: Min guard;Min assist Stand pivot  transfers: Min assist       General transfer comment: unsteady labored movement  Ambulation/Gait Ambulation/Gait assistance: Min assist Gait Distance (Feet): 45 Feet Assistive device: Rolling walker (2 wheeled) Gait Pattern/deviations: Decreased step length - right;Decreased step length - left;Decreased stride length Gait velocity: decreased   General Gait Details: slow labored unsteady cadence, limited mostly due to SOB and fatigue, on 3 LPM with SpO2 dropping from 93% to 86%  Stairs            Wheelchair Mobility    Modified Rankin (Stroke Patients Only)       Balance Overall balance assessment: Needs assistance Sitting-balance support: Feet supported;No upper extremity supported Sitting balance-Leahy Scale: Good Sitting balance - Comments: seated at EOB   Standing balance support: During functional activity;Bilateral upper extremity supported Standing balance-Leahy Scale: Poor Standing balance comment: fair/poor using RW                             Pertinent Vitals/Pain Pain Assessment: Faces Faces Pain Scale: Hurts a little bit Pain Location: stomach Pain Descriptors / Indicators: Aching Pain Intervention(s): Limited activity within patient's tolerance;Monitored during session    [place] expects to be discharged to:: Private residence Living Arrangements: Spouse/significant other Available Help at Discharge: Family;Available PRN/intermittently Type of Home: House Home Access: Stairs to enter Entrance Stairs-Rails: None Entrance Stairs-Number of Steps: 7 Home Layout: Multi-level Home Equipment: Walker - 2 wheels;Cane - single point;Shower seat;Bedside commode Additional Comments: Patient slightly confused, may be poor historian    Prior Function Level of Independence: Independent with assistive device(s)         Comments: household ambulator using  SPC or RW     Hand Dominance   Dominant Hand: Right     Extremity/Trunk Assessment   Upper Extremity Assessment Upper Extremity Assessment: Overall WFL for tasks assessed    Lower Extremity Assessment Lower Extremity Assessment: Generalized weakness    Cervical / Trunk Assessment Cervical / Trunk Assessment: Normal  Communication   Communication: No difficulties  Cognition Arousal/Alertness: Awake/alert Behavior During Therapy: WFL for tasks assessed/performed Overall Cognitive Status: No family/caregiver present to determine baseline cognitive functioning                                 General Comments: Patient admitts to being slightly confused with difficulty answering some questions      General Comments      Exercises     Assessment/Plan    PT Assessment Patient needs continued PT services  PT Problem List Decreased strength;Decreased activity tolerance;Decreased balance;Decreased mobility       PT Treatment Interventions Stair training;Gait training;Functional mobility training;Therapeutic activities;Therapeutic exercise;Patient/family education;Balance training    PT Goals (Current goals can be found in the Care Plan section)  Acute Rehab PT Goals Patient Stated Goal: return home with family to assist PT Goal Formulation: With patient Time For Goal Achievement: 11/06/19 Potential to Achieve Goals: Good    Frequency Min 3X/week   Barriers to discharge        Co-evaluation               AM-PAC PT "6 Clicks" Mobility  Outcome Measure Help needed turning from your back to your side while in a flat bed without using bedrails?: None Help needed moving from lying on your back to sitting on the side of a flat bed without using bedrails?: A Little Help needed moving to and from a bed to a chair (including a wheelchair)?: A Little Help needed standing up from a chair using your arms (e.g., wheelchair or bedside chair)?: A Little Help needed to walk in hospital room?: A Little Help needed  climbing 3-5 steps with a railing? : A Lot 6 Click Score: 18    End of Session Equipment Utilized During Treatment: Oxygen Activity Tolerance: Patient tolerated treatment well;Patient limited by fatigue Patient left: in chair;with call bell/phone within reach;with chair alarm set Nurse Communication: Mobility status PT Visit Diagnosis: Unsteadiness on feet (R26.81);Other abnormalities of gait and mobility (R26.89);Muscle weakness (generalized) (M62.81)    Time: 2542-7062 PT Time Calculation (min) (ACUTE ONLY): 32 min   Charges:   PT Evaluation $PT Eval Moderate Complexity: 1 Mod PT Treatments $Therapeutic Activity: 23-37 mins        10:16 AM, 10/23/19 Lonell Grandchild, MPT Physical Therapist with [facility] [phone] office [phone] mobile phone

## 2019-10-23 NOTE — TOC Initial Note (Signed)
Transition of Care [facility]) - Initial/Assessment Note   Patient Details  Name: Ian Aguilar MRN: [id number] Date of Birth: 08/25/1940  Transition of Care [facility]) CM/SW Contact:    Sherie Don, LCSW Phone Number: 10/23/2019, 11:50 AM  Clinical Narrative: Patient is a 79 year old male who is under observation for acute encephalopathy. Patient has a history of COPD, chronic hypoxemic respiratory failure, hypertensive urgency, hepatic cirrhosis, hypertension, hyperlipidemia, and hypothyroidism. PT evaluation recommended SNF unless family is able to assist with transfers and can provide full-time supervision. CSW spoke with wife, Alfons Sulkowski, due to patient's altered mental status. Per wife, she is unable to care for the patient at home and he will need to discharge to SNF for rehab. FL2 completed; PASRR confirmed. Referral faxed out. CSW attempted to start insurance authorization, but Crystal with [employer] requested that it be started tomorrow due to pending tests/results. TOC awaiting bed offers.  Expected Discharge Plan: Skilled Nursing Facility Barriers to Discharge: Continued Medical Work up  Patient Goals and CMS Choice Patient states their goals for this hospitalization and ongoing recovery are:: Discharge to SNF CMS Medicare.gov Compare Post Acute Care list provided to:: Patient Represenative (must comment) Carolyn Stare (wife)) Choice offered to / list presented to : Spouse  Expected Discharge Plan and Services Expected Discharge Plan: [place] In-house Referral: Clinical Social Work Discharge Planning Services: NA Post Acute Care Choice: [place] Living arrangements for the past 2 months: Single Family Home              DME Arranged: N/A DME Agency: NA HH Arranged: NA [place] Agency: NA  Prior Living Arrangements/Services Living arrangements for the past 2 months: [place] Lives with:: Spouse Patient language and need for interpreter reviewed::  Yes Do you feel safe going back to the place where you live?: Yes      Need for Family Participation in Patient Care: Yes (Comment) (Patient has altered mental status) Care giver support system in place?: Yes (comment) Criminal Activity/Legal Involvement Pertinent to Current Situation/Hospitalization: No - Comment as needed  Activities of Daily Living Home Assistive Devices/Equipment: Oxygen ADL Screening (condition at time of admission) Patient's cognitive ability adequate to safely complete daily activities?: Yes Is the patient deaf or have difficulty hearing?: No Does the patient have difficulty seeing, even when wearing glasses/contacts?: No Does the patient have difficulty concentrating, remembering, or making decisions?: No Patient able to express need for assistance with ADLs?: Yes Does the patient have difficulty dressing or bathing?: No Independently performs ADLs?: Yes (appropriate for developmental age) Does the patient have difficulty walking or climbing stairs?: No Weakness of Legs: None Weakness of Arms/Hands: None  Permission Sought/Granted Permission sought to share information with : Family Supports, Customer service manager Share Information with NAME: Renaldo Gornick (wife) Permission granted to share info w AGENCY: SNFs  Emotional Assessment Appearance:: Appears stated age Orientation: : Oriented to Self, Oriented to Place, Oriented to Situation Alcohol / Substance Use: Tobacco Use Psych Involvement: No (comment)  Admission diagnosis:  AMS (altered mental status) [R41.82] Patient Active Problem List   Diagnosis Date Noted  . Acute encephalopathy 10/23/2019  . Dehydration 10/23/2019  . COPD with acute exacerbation ([place]) 10/23/2019  . Chronic hypoxemic respiratory failure ([place]) 10/23/2019  . Hypertensive urgency 10/23/2019  . Malignant neoplasm of bronchus and lung ([place])   . Carcinoma, lung ([place]) 06/05/2017  . Hepatic cirrhosis ([place]) 04/06/2017  .  History of colonic polyps 04/06/2017  . History of  snoring 09/07/2015  . Fracture of ulnar styloid   . Benign essential HTN   . Faintness   . Thrombocytopenia ([place]) 09/03/2015  . Hyponatremia 09/03/2015  . Closed displaced intertrochanteric fracture of right femur ([place])   . Distal radius fracture, right 09/02/2015  . Fracture of right hip requiring operative repair ([place]) 09/01/2015  . Alcohol abuse 09/01/2015  . Tobacco abuse 09/01/2015  . COPD (chronic obstructive pulmonary disease) 09/01/2015  . 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 Weakness of left leg 01/10/2012  . Difficulty in walking(719.7) 01/10/2012  . Pain in left hip 01/10/2012  . Acute blood loss anemia 12/21/2011  . Cirrhosis- imaging diagnosis 2013 09/08/2011  . Umbilical hernia 41/71/2787  . 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  . Hepatomegaly 06/14/2011  . Encounter for screening colonoscopy 06/14/2011  . JOINT EFFUSION, KNEE 02/10/2008  . KNEE PAIN 02/10/2008   PCP:  Sharilyn Sites, MD Pharmacy:   [facility] [place], [place] AT [place] [address] [place] [address] Phone: [phone] Fax: [phone]  [facility] - [place], [place] - [address] [place] [address] Phone: [phone] Fax: [phone]  Readmission Risk Interventions No flowsheet data found.

## 2019-10-23 NOTE — Consult Note (Signed)
Consultation Note Date: 10/23/2019   Patient Name: Ian Aguilar  DOB: [birth year]  MRN: [id number]  Age / Sex: 79 y.o., male  PCP: Sharilyn Sites, MD Referring Physician: Aline August, MD  Reason for Consultation: Establishing goals of care and Psychosocial/spiritual support  HPI/Patient Profile: 79 y.o. 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   Clinical Assessment and Goals of Care: I have reviewed medical records including EPIC notes, labs and imaging, examined the patient and met at bedside with the patient to discuss diagnosis prognosis, GOC, EOL wishes, disposition and options.  Ian Aguilar is sitting up in the [place] chair in his room.  He greets me making and keeping eye contact.  He appears chronically ill and somewhat frail.  He is alert and oriented, able to make his needs known.  There is no family at bedside at this time.  I introduced Palliative Medicine as specialized medical care for people living with serious illness. It focuses on providing relief from the symptoms and stress of a serious illness.   We discussed current illness and what it means in the larger context of on-going co-morbidities.  Natural disease trajectory and expectations were discussed.  Advanced directives, concepts specific to code status, were considered and discussed, see below. Healthcare power of attorney was discussed, see below.  Questions and concerns were addressed.  The family was encouraged to call with questions or concerns.    HCPOA    NEXT OF  KIN -[name] names his wife, Ian Aguilar as his Ambulance person.  He has a daughter Ian Aguilar in [place] and his son Ian Aguilar who lives nearby.  He states that they would help with decision-making.    SUMMARY OF RECOMMENDATIONS   At this point continue full scope/full code.   Continue CODE STATUS discussions. Agreeable to short-term rehab   Code Status/Advance Care Planning:  Full code -we talked about the concept of "treat the treatable, but allowing natural death".  Ian Aguilar states that at this point he would want full scope/full code.  I asked if he has thought about for how long, and he tells me that he has not.  We talked about trach after 14 days.  I encouraged him to consider his choices and have meaningful discussions with his family.  Symptom Management:   Per hospitalist, no additional needs at this time.  Palliative Prophylaxis:   Frequent Pain Assessment and Oral Care  Additional Recommendations (Limitations, Scope, Preferences):  Full Scope Treatment  Psycho-social/Spiritual:   Desire for further Chaplaincy support:no  Additional Recommendations: Caregiving  Support/Resources and Education on Hospice  Prognosis:  Unable to determine, based on outcomes.  Would clearly qualify for outpatient palliative services,  but not likely qualify for hospice care.  Discharge Planning: At this point he is agreeable to short-term rehab.      Primary Diagnoses: Present on Admission: **None**   I have reviewed the medical record, interviewed the patient and family, and examined the patient. The following aspects are pertinent.  Past Medical History:  Diagnosis Date  . Anxiety   . Arthritis   . Cancer (Ian Aguilar)   . Cirrhosis ([place])    confirmed by MRI on 07/04/11.    Ian Aguilar COPD (chronic obstructive pulmonary disease) ([place])   . Depression with anxiety   . ETOH abuse   . Hyperlipidemia   . Hypertension    diet control  . Nodule of upper lobe of right lung     with mediastinal adenopathy  . Wears dentures   . Wears glasses    Social History   Socioeconomic History  . Marital status: Married    Spouse name: Not on file  . Number of children: Not on file  . Years of education: Not on file  . Highest education level: Not on file  Occupational History  . Occupation: retired    Fish farm manager: [employer]  Tobacco Use  . Smoking status: Current Every Day Smoker    Packs/day: 0.10    Years: 62.00    Pack years: 6.20    Types: Cigarettes  . Smokeless tobacco: Never Used  . Tobacco comment: 1/2 ppd > 60 years as of 06/27/17: 4-5 cigarettes or less a day  Vaping Use  . Vaping Use: Never used  Substance and Sexual Activity  . Alcohol use: Not Currently    Comment: stopped 4 years ago  . Drug use: No  . Sexual activity: Yes    Birth control/protection: None  Other Topics Concern  . Not on file  Social History Narrative  . Not on file   Social Determinants of Health   Financial Resource Strain:   . Difficulty of Paying Living Expenses: Not on file  Food Insecurity:   . Worried About Charity fundraiser in the Last Year: Not on file  . Ran Out of Food in the Last Year: Not on file  Transportation Needs:   . Lack of Transportation (Medical): Not on file  . Lack of Transportation (Non-Medical): Not on file  Physical Activity:   . Days of Exercise per Week: Not on file  . Minutes of Exercise per Session: Not on file  Stress:   . Feeling of Stress : Not on file  Social Connections:   . Frequency of Communication with Friends and Family: Not on file  . Frequency of Social Gatherings with Friends and Family: Not on file  . Attends Religious Services: Not on file  . Active Member of Clubs or Organizations: Not on file  . Attends Archivist Meetings: Not on file  . Marital Status: Not on file   Family History  Problem Relation Age of Onset  . Stroke Mother 42  . Heart disease Father 59       MI  . Diabetes Maternal  Uncle   . Colon cancer Neg Hx   . Cancer Neg Hx    Scheduled Meds: . cholecalciferol  1,000 Units Oral Daily  . doxycycline  100 mg Oral Q12H  . enoxaparin (LOVENOX) injection  40 mg Subcutaneous Q24H  . ipratropium-albuterol  3 mL Nebulization Q6H  . levothyroxine  100 mcg Oral QAC breakfast  . methylPREDNISolone (SOLU-MEDROL) injection  40  mg Intravenous Q8H  . mometasone-formoterol  2 puff Inhalation BID  . rosuvastatin  10 mg Oral Daily   Continuous Infusions: PRN Meds:.acetaminophen **OR** acetaminophen, albuterol, hydrALAZINE Medications Prior to Admission:  Prior to Admission medications   Medication Sig Start Date End Date Taking? Authorizing Provider  albuterol (VENTOLIN HFA) 108 (90 Base) MCG/ACT inhaler Inhale 2 puffs into the lungs daily.  06/05/19  Yes [provider]  cholecalciferol (VITAMIN D) 1000 units tablet Take 1,000 Units by mouth daily.   Yes [provider]  guaiFENesin (MUCINEX) 600 MG 12 hr tablet Take 600 mg by mouth daily.    Yes [provider]  levothyroxine (SYNTHROID) 100 MCG tablet TAKE 1 TABLET(100 MCG) BY MOUTH DAILY BEFORE BREAKFAST Patient taking differently: Take 100 mcg by mouth daily before breakfast.  06/03/19  Yes Lockamy, Randi L, NP-C  meclizine (ANTIVERT) 25 MG tablet Take 25 mg by mouth as needed for dizziness.  12/07/17  Yes [provider]  Propylene Glycol-Glycerin (SOOTHE) 0.6-0.6 % SOLN Place 1-2 drops into both eyes as needed (for dry/irritated eyes.).    Yes [provider]  rosuvastatin (CRESTOR) 10 MG tablet Take 10 mg by mouth daily. 06/13/19  Yes [provider]  SPIRIVA HANDIHALER 18 MCG inhalation capsule Place 18 mcg into inhaler and inhale daily.  10/18/17  Yes [provider]   No Known Allergies Review of Systems  Unable to perform ROS: Other    Physical Exam Vitals and nursing note reviewed.  Constitutional:      General: He is not in acute distress.     Appearance: He is ill-appearing.  HENT:     Mouth/Throat:     Mouth: Mucous membranes are moist.  Cardiovascular:     Rate and Rhythm: Normal rate.  Pulmonary:     Effort: Pulmonary effort is normal. No tachypnea or respiratory distress.  Abdominal:     Palpations: Abdomen is soft.  Skin:    General: Skin is warm and dry.     Comments: Multiple areas of bruising and scabs  Neurological:     Mental Status: He is alert and oriented to person, place, and time.  Psychiatric:        Mood and Affect: Mood normal.        Behavior: Behavior normal.     Vital Signs: BP (!) 157/68 (BP Location: Right Arm)   Pulse 65   Temp 98.8 F (37.1 C) (Oral)   Resp 18   Ht 5' 11" (1.803 m)   Wt 76.2 kg   SpO2 100%   BMI 23.43 kg/m  Pain Scale: 0-10   Pain Score: 0-No pain   SpO2: SpO2: 100 % O2 Device:SpO2: 100 % O2 Flow Rate: .O2 Flow Rate (L/min): 3 L/min  IO: Intake/output summary:   Intake/Output Summary (Last 24 hours) at 10/23/2019 1311 Last data filed at 10/23/2019 8657 Gross per 24 hour  Intake 399.53 ml  Output --  Net 399.53 ml    LBM: Last BM Date: 10/23/19 Baseline Weight: Weight: 80.7 kg Most recent weight: Weight: 76.2 kg     Palliative Assessment/Data:   Flowsheet Rows     Most Recent Value  Intake Tab  Referral Department Hospitalist  Unit at Time of Referral Cardiac/Telemetry Unit  Palliative Care Primary Diagnosis Other (Comment)  Date Notified 10/23/19  Palliative Care Type New Palliative care  Reason for referral Clarify Goals of Care  Date of Admission 10/22/19  Date first seen by Palliative Care 10/23/19  #  of days Palliative referral response time 0 Day(s)  # of days IP prior to Palliative referral 1  Clinical Assessment  Palliative Performance Scale Score 50%  Pain Max last 24 hours Not able to report  Pain Min Last 24 hours Not able to report  Dyspnea Max Last 24 Hours Not able to report  Dyspnea Min Last 24 hours Not able to report   Psychosocial & Spiritual Assessment  Palliative Care Outcomes      Time In: 1300 Time Out: 1330 Time Total: 30 minutes  Greater than 50%  of this time was spent counseling and coordinating care related to the above assessment and plan.  Signed by: Drue Novel, NP   Please contact Palliative Medicine Team phone at [phone] for questions and concerns.  For individual provider: See Shea Evans

## 2019-10-23 NOTE — Plan of Care (Signed)
  Problem: Acute Rehab PT Goals(only PT should resolve) Goal: Pt Will Go Supine/Side To Sit Outcome: Progressing Flowsheets (Taken 10/23/2019 1018) Pt will go Supine/Side to Sit: with supervision Goal: Patient Will Transfer Sit To/From Stand Outcome: Progressing Flowsheets (Taken 10/23/2019 1018) Patient will transfer sit to/from stand: with min guard assist Goal: Pt Will Transfer Bed To Chair/Chair To Bed Outcome: Progressing Flowsheets (Taken 10/23/2019 1018) Pt will Transfer Bed to Chair/Chair to Bed: min guard assist Goal: Pt Will Ambulate Outcome: Progressing Flowsheets (Taken 10/23/2019 1018) Pt will Ambulate:  75 feet  with supervision  with min guard assist  with rolling walker   10:20 AM, 10/23/19 Lonell Grandchild, MPT Physical Therapist with [facility] [phone] office [phone] mobile phone

## 2019-10-23 NOTE — NC FL2 (Signed)
[place] LEVEL OF CARE SCREENING TOOL     IDENTIFICATION  Patient Name: Ian Aguilar Birthdate: Mar 04, 1940 Sex: male Admission Date (Current Location): 10/22/2019  [facility] and [place] Number:  [employer] and Address:  [facility] [address], [place]      Provider Number: [id number]  Attending Physician Name and Address:  Aline August, MD  Relative Name and Phone Number:  Curran Lenderman (wife) Ph: [phone]    Current Level of Care: Hospital Recommended Level of Care: [place] Prior Approval Number:    Date Approved/Denied:   PASRR Number: [id number]  Discharge Plan: SNF    Current Diagnoses: Patient Active Problem List   Diagnosis Date Noted  . Acute encephalopathy 10/23/2019  . Dehydration 10/23/2019  . COPD with acute exacerbation ([place]) 10/23/2019  . Chronic hypoxemic respiratory failure ([place]) 10/23/2019  . Hypertensive urgency 10/23/2019  . Malignant neoplasm of bronchus and lung ([place])   . Carcinoma, lung ([place]) 06/05/2017  . Hepatic cirrhosis ([place]) 04/06/2017  . History of colonic polyps 04/06/2017  . History of snoring 09/07/2015  . Fracture of ulnar styloid   . Benign essential HTN   . Faintness   . Thrombocytopenia ([place]) 09/03/2015  . Hyponatremia 09/03/2015  . Closed displaced intertrochanteric fracture of right femur ([place])   . Distal radius fracture, right 09/02/2015  . Fracture of right hip requiring operative repair ([place]) 09/01/2015  . Alcohol abuse 09/01/2015  . Tobacco abuse 09/01/2015  . COPD (chronic obstructive pulmonary disease) 09/01/2015  . 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 Weakness of left leg 01/10/2012  . Difficulty in walking(719.7) 01/10/2012  . Pain in left hip 01/10/2012  . Acute blood loss anemia 12/21/2011  . Cirrhosis- imaging diagnosis 2013 09/08/2011  . Umbilical hernia 32/67/1245  . 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  . Hepatomegaly 06/14/2011   . Encounter for screening colonoscopy 06/14/2011  . JOINT EFFUSION, KNEE 02/10/2008  . KNEE PAIN 02/10/2008    Orientation RESPIRATION BLADDER Height & Weight     Self, Time, Place  O2 (2L/min) Continent Weight: 167 lb 15.9 oz (76.2 kg) Height:  5\' 11"  (180.3 cm)  BEHAVIORAL SYMPTOMS/MOOD NEUROLOGICAL BOWEL NUTRITION STATUS      Continent Diet (Heart healthy)  AMBULATORY STATUS COMMUNICATION OF NEEDS Skin   Limited Assist Verbally Skin abrasions (Abrasions on both knees)                       Personal Care Assistance Level of Assistance  Bathing, Feeding, Dressing Bathing Assistance: Limited assistance Feeding assistance: Independent Dressing Assistance: Limited assistance     Functional Limitations Info  Sight, Hearing, Speech Sight Info: Adequate Hearing Info: Adequate Speech Info: Adequate    SPECIAL CARE FACTORS FREQUENCY  PT (By licensed PT)     PT Frequency: 5x's/week              Contractures Contractures Info: Not present    Additional Factors Info  Code Status, Allergies Code Status Info: Full Allergies Info: NKA           Current Medications (10/23/2019):  This is the current hospital active medication list Current Facility-Administered Medications  Medication Dose Route Frequency Provider Last Rate Last Admin  . 0.9 %  sodium chloride infusion   Intravenous Continuous Aline August, MD 125 mL/hr at 10/23/19 0020 New Bag at 10/23/19 0020  . acetaminophen (TYLENOL) tablet 650 mg  650 mg Oral Q6H PRN Shela Leff,  MD       Or  . acetaminophen (TYLENOL) suppository 650 mg  650 mg Rectal Q6H PRN Shela Leff, MD      . albuterol (PROVENTIL) (2.5 MG/3ML) 0.083% nebulizer solution 2.5 mg  2.5 mg Nebulization Q2H PRN Starla Link, Kshitiz, MD      . cholecalciferol (VITAMIN D3) tablet 1,000 Units  1,000 Units Oral Daily Shela Leff, MD   1,000 Units at 10/23/19 0903  . doxycycline (VIBRA-TABS) tablet 100 mg  100 mg Oral Q12H Shela Leff, MD   100 mg at 10/23/19 0903  . enoxaparin (LOVENOX) injection 40 mg  40 mg Subcutaneous Q24H Shela Leff, MD   40 mg at 10/23/19 0902  . hydrALAZINE (APRESOLINE) injection 5 mg  5 mg Intravenous Q4H PRN Shela Leff, MD      . ipratropium-albuterol (DUONEB) 0.5-2.5 (3) MG/3ML nebulizer solution 3 mL  3 mL Nebulization Q6H Alekh, Kshitiz, MD      . levothyroxine (SYNTHROID) tablet 100 mcg  100 mcg Oral QAC breakfast Shela Leff, MD   100 mcg at 10/23/19 0536  . methylPREDNISolone sodium succinate (SOLU-MEDROL) 40 mg/mL injection 40 mg  40 mg Intravenous Q8H Alekh, Kshitiz, MD      . mometasone-formoterol (DULERA) 100-5 MCG/ACT inhaler 2 puff  2 puff Inhalation BID Alekh, Kshitiz, MD      . rosuvastatin (CRESTOR) tablet 10 mg  10 mg Oral Daily Shela Leff, MD   10 mg at 10/23/19 1505     Discharge Medications: Please see discharge summary for a list of discharge medications.  Relevant Imaging Results:  Relevant Lab Results:   Additional Information SSN: [id number]  Sherie Don, LCSW

## 2019-10-23 NOTE — Progress Notes (Signed)
Patient ID: Ian Aguilar, male   DOB: [birth year], 79 y.o.   MRN: [id number]  PROGRESS NOTE    WM SAHAGUN  XHB:[id number] DOB: October 12, 1940 DOA: 10/22/2019 PCP: Sharilyn Sites, MD   Brief Narrative:   79 y.o. 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  On presentation, chest x-ray showed no acute focal abnormality; COVID-19 test was negative.  CT of the head was negative for acute intracranial abnormality.  Patient was started on IV fluids.  Assessment & Plan:  Acute metabolic encephalopathy -Likely due to dehydration and hypoxia -CT of the head was negative for acute intracranial abnormality.  MRI of the brain is pending.  Monitor mental status.  Fall precautions.  PT/OT eval.  Chest x-ray was not suggestive of pneumonia.  UA was not suggestive of infection.  No leukocytosis on presentation.  Ammonia was 25.  TSH 1.653, vitamin B12 347.  Procalcitonin was less than 0.1 -More awake this morning but still slightly confused  Dehydration -Due to very poor oral intake.  Currently on IV fluids.  Decrease normal saline to 75 cc an hour  COPD exacerbation Chronic hypoxic respiratory failure -Wears 2 L oxygen at home.  Currently on 2 L oxygen. -Currently on prednisone 40 mg daily.  We will switch to Solu-Medrol 40 mg IV every 8 hours.  Continue doxycycline.  Continue nebs.  Add Dulera.   Stage II right upper lobe adenocarcinoma: Status post chemo and radiation.  Followed by oncology.  Chest x-ray showing stable right apical nodule. -outpatient oncology follow-up  Hypertensive urgency: Upon arrival to the ED, blood pressure elevated with systolic in the 810F.  He did not receive any  antihypertensives.  Blood pressure still on the higher side but improving.  Not on any antihypertensive at home.  Might have to start oral antihypertensives if blood pressure remains elevated.  Use IV hydralazine as needed for now  Hyperlipidemia -Continue Crestor  Hypothyroidism -Continue Synthroid.    Liver cirrhosis: Followed by GI and cirrhosis felt to be secondary to alcohol use in the past.  Appears well compensated at this time. -Ammonia 25.  Outpatient GI evaluation and follow-up  Generalized weakness -PT/OT eval -Palliative care consultation for goals of care discussion.  Overall prognosis is guarded to poor.   DVT prophylaxis: Lovenox Code Status: Full Family Communication: None at bedside Disposition Plan: Status is: Observation  The patient will require care spanning > 2 midnights and should be moved to inpatient because: Inpatient level of care appropriate due to severity of illness  Dispo: The patient is from: Home              Anticipated d/c is to: Home.  Might need SNF placement pending PT eval              Anticipated d/c date is: 2 days              Patient currently is not medically stable to d/c.   Consultants: None  Procedures: None  Antimicrobials: Doxycycline   Subjective: Patient seen and examined at bedside.  Poor historian.  Awake but confused to time.  Does not remember what happened.  States that he is still confused.  No overnight fever, vomiting reported.  Objective: Vitals:   10/22/19  2300 10/22/19 2325 10/22/19 2354 10/23/19 0418  BP: (!) 153/67 (!) 155/71 (!) 167/70 (!) 155/75  Pulse: 74 69 73 69  Resp: 20 18 16 18   Temp:  98.2 F (36.8 C) 97.9 F (36.6 C) 98 F (36.7 C)  TempSrc:  Oral Oral Oral  SpO2: 94% 95% 92% 94%  Weight:   76.2 kg   Height:   5\' 11"  (1.803 m)     Intake/Output Summary (Last 24 hours) at 10/23/2019 0959 Last data filed at 10/23/2019 0332 Gross per 24 hour  Intake 399.53 ml  Output --  Net 399.53  ml   Filed Weights   10/22/19 1533 10/22/19 2354  Weight: 80.7 kg 76.2 kg    Examination:  General exam: Appears calm and comfortable.  Looks chronically ill.  Currently on 2 L oxygen via nasal cannula Respiratory system: Bilateral decreased breath sounds at bases with scattered crackles and some wheezing Cardiovascular system: S1 & S2 heard, Rate controlled Gastrointestinal system: Abdomen is nondistended, soft and nontender. Normal bowel sounds heard. Extremities: No cyanosis, clubbing, edema  Central nervous system: Awake, confused to time.  No focal neurological deficits. Moving extremities Skin: No rashes, lesions or ulcers Psychiatry: Cannot be assessed because of mental status    Data Reviewed: I have personally reviewed following labs and imaging studies  CBC: Recent Labs  Lab 10/22/19 1547  WBC 5.4  HGB 13.4  HCT 41.8  MCV 97.7  PLT 625   Basic Metabolic Panel: Recent Labs  Lab 10/22/19 1547  NA 138  K 3.5  CL 97*  CO2 30  GLUCOSE 104*  BUN 32*  CREATININE 1.02  CALCIUM 8.8*   GFR: Estimated Creatinine Clearance: 63.6 mL/min (by C-G formula based on SCr of 1.02 mg/dL). Liver Function Tests: No results for input(s): AST, ALT, ALKPHOS, BILITOT, PROT, ALBUMIN in the last 168 hours. No results for input(s): LIPASE, AMYLASE in the last 168 hours. Recent Labs  Lab 10/23/19 0029  AMMONIA 25   Coagulation Profile: No results for input(s): INR, PROTIME in the last 168 hours. Cardiac Enzymes: No results for input(s): CKTOTAL, CKMB, CKMBINDEX, TROPONINI in the last 168 hours. BNP (last 3 results) No results for input(s): PROBNP in the last 8760 hours. HbA1C: No results for input(s): HGBA1C in the last 72 hours. CBG: No results for input(s): GLUCAP in the last 168 hours. Lipid Profile: No results for input(s): CHOL, HDL, LDLCALC, TRIG, CHOLHDL, LDLDIRECT in the last 72 hours. Thyroid Function Tests: Recent Labs    10/22/19 1547  TSH 1.653   Anemia  Panel: Recent Labs    10/22/19 1547  VITAMINB12 347   Sepsis Labs: Recent Labs  Lab 10/23/19 0029  PROCALCITON <0.10    Recent Results (from the past 240 hour(s))  SARS Coronavirus 2 by RT PCR (hospital order, performed in [facility] hospital lab) Nasopharyngeal Nasopharyngeal Swab     Status: None   Collection Time: 10/22/19  3:53 PM   Specimen: Nasopharyngeal Swab  Result Value Ref Range Status   SARS Coronavirus 2 NEGATIVE NEGATIVE Final    Comment: (NOTE) SARS-CoV-2 target nucleic acids are NOT DETECTED.  The SARS-CoV-2 RNA is generally detectable in upper and lower respiratory specimens during the acute phase of infection. The lowest concentration of SARS-CoV-2 viral copies this assay can detect is 250 copies / mL. A negative result does not preclude SARS-CoV-2 infection and should not be used as the sole basis for treatment or other patient management decisions.  A negative result  may occur with improper specimen collection / handling, submission of specimen other than nasopharyngeal swab, presence of viral mutation(s) within the areas targeted by this assay, and inadequate number of viral copies (<250 copies / mL). A negative result must be combined with clinical observations, patient history, and epidemiological information.  Fact Sheet for Patients:   StrictlyIdeas.no  Fact Sheet for Healthcare Providers: BankingDealers.co.za  This test is not yet approved or  cleared by the [place] FDA and has been authorized for detection and/or diagnosis of SARS-CoV-2 by FDA under an Emergency Use Authorization (EUA).  This EUA will remain in effect (meaning this test can be used) for the duration of the COVID-19 declaration under Section 564(b)(1) of the Act, 21 U.S.C. section 360bbb-3(b)(1), unless the authorization is terminated or revoked sooner.  Performed at [facility], [address], [place], [place] [address]            Radiology Studies: CT Head Wo Contrast  Result Date: 10/22/2019 CLINICAL DATA:  Mental status change EXAM: CT HEAD WITHOUT CONTRAST TECHNIQUE: Contiguous axial images were obtained from the base of the skull through the vertex without intravenous contrast. COMPARISON:  MRI 04/29/2018 FINDINGS: Brain: No acute territorial infarction, hemorrhage or intracranial mass. Mild to moderate atrophy. Mild hypodensity in the white matter consistent with chronic small vessel ischemic change. Nonenlarged ventricles Vascular: No hyperdense vessels.  Carotid vascular calcification Skull: Normal. Negative for fracture or focal lesion. Sinuses/Orbits: No acute finding. Other: None IMPRESSION: 1. No CT evidence for acute intracranial abnormality. 2. Atrophy and mild chronic small vessel ischemic changes of the white matter. Electronically Signed   By: Donavan Foil M.D.   On: 10/22/2019 21:41   DG Chest Port 1 View  Result Date: 10/22/2019 CLINICAL DATA:  Cough and shortness of breath EXAM: PORTABLE CHEST 1 VIEW COMPARISON:  06/16/2019 FINDINGS: Cardiac shadow is within normal limits. The lungs are well aerated bilaterally. Medial right apical nodule is noted stable from the prior CT examination. No focal infiltrate or effusion is seen. No bony abnormality is noted. Left chest wall port is noted satisfactory position. Aortic calcifications are noted. IMPRESSION: Stable right apical nodule. No other focal abnormality is seen. Electronically Signed   By: Inez Catalina M.D.   On: 10/22/2019 17:13        Scheduled Meds: . cholecalciferol  1,000 Units Oral Daily  . doxycycline  100 mg Oral Q12H  . enoxaparin (LOVENOX) injection  40 mg Subcutaneous Q24H  . levothyroxine  100 mcg Oral QAC breakfast  . predniSONE  40 mg Oral Q breakfast  . rosuvastatin  10 mg Oral Daily   Continuous Infusions: . sodium chloride 125 mL/hr at 10/23/19 0020          Aline August, MD [facility] 10/23/2019,  9:59 AM

## 2019-10-24 DIAGNOSIS — G9340 Encephalopathy, unspecified: Secondary | ICD-10-CM | POA: Diagnosis not present

## 2019-10-24 DIAGNOSIS — M1990 Unspecified osteoarthritis, unspecified site: Secondary | ICD-10-CM | POA: Diagnosis present

## 2019-10-24 DIAGNOSIS — I160 Hypertensive urgency: Secondary | ICD-10-CM | POA: Diagnosis present

## 2019-10-24 DIAGNOSIS — Z923 Personal history of irradiation: Secondary | ICD-10-CM | POA: Diagnosis not present

## 2019-10-24 DIAGNOSIS — E860 Dehydration: Secondary | ICD-10-CM | POA: Diagnosis present

## 2019-10-24 DIAGNOSIS — R4781 Slurred speech: Secondary | ICD-10-CM | POA: Diagnosis present

## 2019-10-24 DIAGNOSIS — F418 Other specified anxiety disorders: Secondary | ICD-10-CM | POA: Diagnosis present

## 2019-10-24 DIAGNOSIS — Z9221 Personal history of antineoplastic chemotherapy: Secondary | ICD-10-CM | POA: Diagnosis not present

## 2019-10-24 DIAGNOSIS — Z20822 Contact with and (suspected) exposure to covid-19: Secondary | ICD-10-CM | POA: Diagnosis present

## 2019-10-24 DIAGNOSIS — Z823 Family history of stroke: Secondary | ICD-10-CM | POA: Diagnosis not present

## 2019-10-24 DIAGNOSIS — Z96642 Presence of left artificial hip joint: Secondary | ICD-10-CM | POA: Diagnosis present

## 2019-10-24 DIAGNOSIS — Z833 Family history of diabetes mellitus: Secondary | ICD-10-CM | POA: Diagnosis not present

## 2019-10-24 DIAGNOSIS — J441 Chronic obstructive pulmonary disease with (acute) exacerbation: Secondary | ICD-10-CM | POA: Diagnosis present

## 2019-10-24 DIAGNOSIS — R0602 Shortness of breath: Secondary | ICD-10-CM | POA: Diagnosis present

## 2019-10-24 DIAGNOSIS — Z9981 Dependence on supplemental oxygen: Secondary | ICD-10-CM | POA: Diagnosis not present

## 2019-10-24 DIAGNOSIS — J9611 Chronic respiratory failure with hypoxia: Secondary | ICD-10-CM | POA: Diagnosis present

## 2019-10-24 DIAGNOSIS — G9341 Metabolic encephalopathy: Secondary | ICD-10-CM | POA: Diagnosis present

## 2019-10-24 DIAGNOSIS — Z85118 Personal history of other malignant neoplasm of bronchus and lung: Secondary | ICD-10-CM | POA: Diagnosis not present

## 2019-10-24 DIAGNOSIS — I10 Essential (primary) hypertension: Secondary | ICD-10-CM | POA: Diagnosis present

## 2019-10-24 DIAGNOSIS — R443 Hallucinations, unspecified: Secondary | ICD-10-CM | POA: Diagnosis present

## 2019-10-24 DIAGNOSIS — E039 Hypothyroidism, unspecified: Secondary | ICD-10-CM | POA: Diagnosis present

## 2019-10-24 DIAGNOSIS — K7030 Alcoholic cirrhosis of liver without ascites: Secondary | ICD-10-CM | POA: Diagnosis present

## 2019-10-24 DIAGNOSIS — E785 Hyperlipidemia, unspecified: Secondary | ICD-10-CM | POA: Diagnosis present

## 2019-10-24 DIAGNOSIS — F17210 Nicotine dependence, cigarettes, uncomplicated: Secondary | ICD-10-CM | POA: Diagnosis present

## 2019-10-24 DIAGNOSIS — C3411 Malignant neoplasm of upper lobe, right bronchus or lung: Secondary | ICD-10-CM | POA: Diagnosis present

## 2019-10-24 DIAGNOSIS — Z8249 Family history of ischemic heart disease and other diseases of the circulatory system: Secondary | ICD-10-CM | POA: Diagnosis not present

## 2019-10-24 LAB — COMPREHENSIVE METABOLIC PANEL
ALT: 27 U/L (ref 0–44)
AST: 53 U/L — ABNORMAL HIGH (ref 15–41)
Albumin: 3.6 g/dL (ref 3.5–5.0)
Alkaline Phosphatase: 55 U/L (ref 38–126)
Anion gap: 9 (ref 5–15)
BUN: 25 mg/dL — ABNORMAL HIGH (ref 8–23)
CO2: 28 mmol/L (ref 22–32)
Calcium: 8.9 mg/dL (ref 8.9–10.3)
Chloride: 99 mmol/L (ref 98–111)
Creatinine, Ser: 0.84 mg/dL (ref 0.61–1.24)
GFR calc Af Amer: >60 mL/min (ref >60)
GFR calc non Af Amer: >60 mL/min (ref >60)
Glucose, Bld: 147 mg/dL — ABNORMAL HIGH (ref 70–99)
Potassium: 3.7 mmol/L (ref 3.5–5.1)
Sodium: 136 mmol/L (ref 135–145)
Total Bilirubin: 1 mg/dL (ref 0.3–1.2)
Total Protein: 6.4 g/dL — ABNORMAL LOW (ref 6.5–8.1)

## 2019-10-24 LAB — CBC WITH DIFFERENTIAL/PLATELET
Abs Immature Granulocytes: 0.04 10*3/uL (ref 0.00–0.07)
Basophils Absolute: 0 10*3/uL (ref 0.0–0.1)
Basophils Relative: 0 %
Eosinophils Absolute: 0 10*3/uL (ref 0.0–0.5)
Eosinophils Relative: 0 %
HCT: 40.4 % (ref 39.0–52.0)
Hemoglobin: 13 g/dL (ref 13.0–17.0)
Immature Granulocytes: 1 %
Lymphocytes Relative: 6 %
Lymphs Abs: 0.5 10*3/uL — ABNORMAL LOW (ref 0.7–4.0)
MCH: 31 pg (ref 26.0–34.0)
MCHC: 32.2 g/dL (ref 30.0–36.0)
MCV: 96.4 fL (ref 80.0–100.0)
Monocytes Absolute: 0.4 10*3/uL (ref 0.1–1.0)
Monocytes Relative: 5 %
Neutro Abs: 7.2 10*3/uL (ref 1.7–7.7)
Neutrophils Relative %: 88 %
Platelets: 181 10*3/uL (ref 150–400)
RBC: 4.19 MIL/uL — ABNORMAL LOW (ref 4.22–5.81)
RDW: 14.4 % (ref 11.5–15.5)
WBC: 8.1 10*3/uL (ref 4.0–10.5)
nRBC: 0 % (ref 0.0–0.2)

## 2019-10-24 LAB — MAGNESIUM: Magnesium: 1.9 mg/dL (ref 1.7–2.4)

## 2019-10-24 MED ORDER — METHYLPREDNISOLONE SODIUM SUCC 40 MG IJ SOLR
40.0000 mg | Freq: Two times a day (BID) | INTRAMUSCULAR | Status: DC
  Administered 2019-10-24 – 2019-10-25 (×2): 40 mg via INTRAVENOUS
  Filled 2019-10-24 (×2): qty 1

## 2019-10-24 MED ORDER — AMLODIPINE BESYLATE 5 MG PO TABS
10.0000 mg | ORAL_TABLET | Freq: Every day | ORAL | Status: DC
  Administered 2019-10-24: 10 mg via ORAL
  Filled 2019-10-24 (×2): qty 2

## 2019-10-24 NOTE — TOC Progression Note (Addendum)
Transition of Care [facility]) - Progression Note    Patient Details  Name: LEJUAN BOTTO MRN: [id number] Date of Birth: April 07, 1940  Transition of Care [facility]) CM/SW Contact  Salome Arnt, [place] Phone Number: 10/24/2019, 2:00 PM  Clinical Narrative:   Pt's wife given bed offers and selects [facility]. Facility and [employer] notified. [employer] is working on authorization. Pt's wife concerned about taking pt to SNF in car. She is aware pt ambulated 71' with PT yesterday. LCSW explained that pt could still transport via EMS if requested, but they may receive bill. Discussed with [facility]. LCSW left voicemail for pt's wife explaining that staff will assist with getting pt in car at hospital and [facility] has agreed to assist with getting pt out of car and in to facility.   Update: Pt has been approved for 6 days (Auth: [id number]). HTA to call [place]. Pt's wife updated.    Expected Discharge Plan: [place] Barriers to Discharge: Continued Medical Work up  Expected Discharge Plan and Services Expected Discharge Plan: [place] In-house Referral: Clinical Social Work Discharge Planning Services: NA Post Acute Care Choice: [place] Living arrangements for the past 2 months: Single Family Home                 DME Arranged: N/A DME Agency: NA       HH Arranged: NA HH Agency: NA         Social Determinants of Health (SDOH) Interventions    Readmission Risk Interventions No flowsheet data found.

## 2019-10-24 NOTE — Care Management Obs Status (Signed)
[place] NOTIFICATION   Patient Details  Name: Ian Aguilar MRN: [id number] Date of Birth: August 28, 1940   Medicare Observation Status Notification Given:  Yes    Tommy Medal 10/24/2019, 9:30 AM

## 2019-10-24 NOTE — Progress Notes (Addendum)
Patient ID: Ian Aguilar, male   DOB: 1940-04-20, 79 y.o.   MRN: [id number]  PROGRESS NOTE    Ian Aguilar  HQP:[id number] DOB: 1940-03-29 DOA: 10/22/2019 PCP: Sharilyn Sites, MD   Brief Narrative:   79 y.o. 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  On presentation, chest x-ray showed no acute focal abnormality; COVID-19 test was negative.  CT of the head was negative for acute intracranial abnormality.  Patient was started on IV fluids.  Assessment & Plan:  Acute metabolic encephalopathy -Likely due to dehydration and hypoxia -CT of the head was negative for acute intracranial abnormality.  MRI of the brain was negative for any acute infarct.  Monitor mental status.  Fall precautions.  PT/OT recommend SNF placement.  Chest x-ray was not suggestive of pneumonia.  UA was not suggestive of infection.  No leukocytosis on presentation.  Ammonia was 25.  TSH 1.653, vitamin B12 347.  Procalcitonin was less than 0.1 -Mental status slowly improving.  Monitor.   Dehydration -Due to very poor oral intake.  Currently on IV fluids.  DC IV fluids.  COPD exacerbation Chronic hypoxic respiratory failure -Wears 2 L oxygen at home.  Currently on 3 L oxygen. -Decrease Solu-Medrol to 40 mg IV every 12 hours.  Continue doxycycline.  Continue nebs and Dulera.  Stage II right upper lobe adenocarcinoma: Status post chemo and radiation.  Followed by oncology.  Chest x-ray showing stable right apical nodule. -outpatient oncology follow-up  Hypertensive urgency: Upon arrival to the ED, blood pressure elevated with systolic in the 599J.  He did not receive any antihypertensives.  Blood pressure still on  the higher side.  Not on any antihypertensive at home.  Will start amlodipine.    Hyperlipidemia -Continue Crestor  Hypothyroidism -Continue Synthroid.    Liver cirrhosis: Followed by GI and cirrhosis felt to be secondary to alcohol use in the past.  Appears well compensated at this time. -Ammonia 25.  Outpatient GI evaluation and follow-up  Generalized weakness -PT/OT eval -Palliative care consultation appreciated.  Remains full code.  Overall prognosis is guarded to poor.   DVT prophylaxis: Lovenox Code Status: Full Family Communication: Spoke to wife/Carol on phone on 10/24/2019  disposition Plan: Status is: Observation  The patient will require care spanning > 2 midnights and should be moved to inpatient because: Inpatient level of care appropriate due to severity of illness  Dispo: The patient is from: Home              Anticipated d/c is to: SNF.                Anticipated d/c date is: 1 day              Patient currently is not medically stable to d/c.   Consultants: None  Procedures: None  Antimicrobials: Doxycycline   Subjective: Patient seen and examined at bedside.  Poor historian.  No fever, vomiting, worsening shortness of breath reported. Objective: Vitals:   10/23/19 2022 10/23/19 2151 10/24/19 0626 10/24/19 0711  BP:  (!) 160/69 (!) 178/96   Pulse:  73 68   Resp:  16 20   Temp:  98.2 F (36.8 C) 97.6 F (36.4 C)   TempSrc:  Axillary Oral  SpO2: 93% 95% 96% 96%  Weight:      Height:        Intake/Output Summary (Last 24 hours) at 10/24/2019 0717 Last data filed at 10/23/2019 1700 Gross per 24 hour  Intake 1260 ml  Output 1050 ml  Net 210 ml   Filed Weights   10/22/19 1533 10/22/19 2354  Weight: 80.7 kg 76.2 kg    Examination:  General exam: No acute distress.  Looks chronically ill.  Currently on 3 L oxygen via nasal cannula Respiratory system: Bilateral decreased breath sounds at bases some crackles and wheezing cardiovascular  system: Rate controlled, S1-S2 heard Gastrointestinal system: Abdomen is nondistended, soft and nontender.  Bowel sounds are heard  extremities: Trace lower extremity edema.  No clubbing Central nervous system: Still slightly confused to time.  Awake.  No focal neurological deficits.  Moves extremities. Skin: No obvious ecchymosis/ulcers Psychiatry: Could not be assessed because of confusional status   Data Reviewed: I have personally reviewed following labs and imaging studies  CBC: Recent Labs  Lab 10/22/19 1547  WBC 5.4  HGB 13.4  HCT 41.8  MCV 97.7  PLT 195   Basic Metabolic Panel: Recent Labs  Lab 10/22/19 1547  NA 138  K 3.5  CL 97*  CO2 30  GLUCOSE 104*  BUN 32*  CREATININE 1.02  CALCIUM 8.8*   GFR: Estimated Creatinine Clearance: 63.6 mL/min (by C-G formula based on SCr of 1.02 mg/dL). Liver Function Tests: No results for input(s): AST, ALT, ALKPHOS, BILITOT, PROT, ALBUMIN in the last 168 hours. No results for input(s): LIPASE, AMYLASE in the last 168 hours. Recent Labs  Lab 10/23/19 0029  AMMONIA 25   Coagulation Profile: No results for input(s): INR, PROTIME in the last 168 hours. Cardiac Enzymes: No results for input(s): CKTOTAL, CKMB, CKMBINDEX, TROPONINI in the last 168 hours. BNP (last 3 results) No results for input(s): PROBNP in the last 8760 hours. HbA1C: No results for input(s): HGBA1C in the last 72 hours. CBG: No results for input(s): GLUCAP in the last 168 hours. Lipid Profile: No results for input(s): CHOL, HDL, LDLCALC, TRIG, CHOLHDL, LDLDIRECT in the last 72 hours. Thyroid Function Tests: Recent Labs    10/22/19 1547  TSH 1.653   Anemia Panel: Recent Labs    10/22/19 1547  VITAMINB12 347   Sepsis Labs: Recent Labs  Lab 10/23/19 0029  PROCALCITON <0.10    Recent Results (from the past 240 hour(s))  SARS Coronavirus 2 by RT PCR (hospital order, performed in [facility] hospital lab) Nasopharyngeal Nasopharyngeal Swab      Status: None   Collection Time: 10/22/19  3:53 PM   Specimen: Nasopharyngeal Swab  Result Value Ref Range Status   SARS Coronavirus 2 NEGATIVE NEGATIVE Final    Comment: (NOTE) SARS-CoV-2 target nucleic acids are NOT DETECTED.  The SARS-CoV-2 RNA is generally detectable in upper and lower respiratory specimens during the acute phase of infection. The lowest concentration of SARS-CoV-2 viral copies this assay can detect is 250 copies / mL. A negative result does not preclude SARS-CoV-2 infection and should not be used as the sole basis for treatment or other patient management decisions.  A negative result may occur with improper specimen collection / handling, submission of specimen other than nasopharyngeal swab, presence of viral mutation(s) within the areas targeted by this assay, and inadequate number of viral copies (<250 copies / mL). A negative result must be combined with clinical observations, patient history, and epidemiological information.  Fact Sheet  for Patients:   StrictlyIdeas.no  Fact Sheet for Healthcare Providers: BankingDealers.co.za  This test is not yet approved or  cleared by the [place] FDA and has been authorized for detection and/or diagnosis of SARS-CoV-2 by FDA under an Emergency Use Authorization (EUA).  This EUA will remain in effect (meaning this test can be used) for the duration of the COVID-19 declaration under Section 564(b)(1) of the Act, 21 U.S.C. section 360bbb-3(b)(1), unless the authorization is terminated or revoked sooner.  Performed at [facility], [address], [place], [place] [address]          Radiology Studies: CT Head Wo Contrast  Result Date: 10/22/2019 CLINICAL DATA:  Mental status change EXAM: CT HEAD WITHOUT CONTRAST TECHNIQUE: Contiguous axial images were obtained from the base of the skull through the vertex without intravenous contrast. COMPARISON:  MRI 04/29/2018  FINDINGS: Brain: No acute territorial infarction, hemorrhage or intracranial mass. Mild to moderate atrophy. Mild hypodensity in the white matter consistent with chronic small vessel ischemic change. Nonenlarged ventricles Vascular: No hyperdense vessels.  Carotid vascular calcification Skull: Normal. Negative for fracture or focal lesion. Sinuses/Orbits: No acute finding. Other: None IMPRESSION: 1. No CT evidence for acute intracranial abnormality. 2. Atrophy and mild chronic small vessel ischemic changes of the white matter. Electronically Signed   By: Donavan Foil M.D.   On: 10/22/2019 21:41   MR BRAIN WO CONTRAST  Result Date: 10/23/2019 CLINICAL DATA:  Mental status change EXAM: MRI HEAD WITHOUT CONTRAST TECHNIQUE: Multiplanar, multiecho pulse sequences of the brain and surrounding structures were obtained without intravenous contrast. COMPARISON:  04/29/2018 FINDINGS: Motion artifact is present. Gradient echo and volumetric T1 sequences are nondiagnostic. Brain: There is no acute infarction. There is no intracranial mass, mass effect, or edema. There is no hydrocephalus or extra-axial fluid collection. 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 Patchy and confluent areas of T2 hyperintensity in the supratentorial white matter are nonspecific but may reflect mild to moderate microvascular ischemic changes. Vascular: Major vessel flow voids at the skull base are preserved. Skull and upper cervical spine: Normal marrow signal is preserved. Sinuses/Orbits: Paranasal sinuses are aerated. Bilateral lens replacements. Other: Sella is unremarkable. Mild patchy right mastoid fluid opacification. IMPRESSION: No evidence of recent infarction. No mass. Stable chronic findings detailed above. Electronically Signed   By: Macy Mis M.D.   On: 10/23/2019 19:25   DG Chest Port 1 View  Result Date: 10/22/2019 CLINICAL DATA:  Cough and  shortness of breath EXAM: PORTABLE CHEST 1 VIEW COMPARISON:  06/16/2019 FINDINGS: Cardiac shadow is within normal limits. The lungs are well aerated bilaterally. Medial right apical nodule is noted stable from the prior CT examination. No focal infiltrate or effusion is seen. No bony abnormality is noted. Left chest wall port is noted satisfactory position. Aortic calcifications are noted. IMPRESSION: Stable right apical nodule. No other focal abnormality is seen. Electronically Signed   By: Inez Catalina M.D.   On: 10/22/2019 17:13        Scheduled Meds: . Chlorhexidine Gluconate Cloth  6 each Topical Daily  . cholecalciferol  1,000 Units Oral Daily  . doxycycline  100 mg Oral Q12H  . enoxaparin (LOVENOX) injection  40 mg Subcutaneous Q24H  . ipratropium-albuterol  3 mL Nebulization Q6H  . levothyroxine  100 mcg Oral QAC breakfast  . methylPREDNISolone (SOLU-MEDROL) injection  40 mg Intravenous Q8H  . mometasone-formoterol  2 puff Inhalation BID  . rosuvastatin  10 mg Oral Daily   Continuous Infusions:         Aline August, MD [facility] 10/24/2019, 7:17 AM

## 2019-10-25 DIAGNOSIS — K7030 Alcoholic cirrhosis of liver without ascites: Secondary | ICD-10-CM | POA: Diagnosis not present

## 2019-10-25 DIAGNOSIS — C3411 Malignant neoplasm of upper lobe, right bronchus or lung: Secondary | ICD-10-CM | POA: Diagnosis not present

## 2019-10-25 DIAGNOSIS — E860 Dehydration: Secondary | ICD-10-CM | POA: Diagnosis not present

## 2019-10-25 DIAGNOSIS — R531 Weakness: Secondary | ICD-10-CM | POA: Diagnosis not present

## 2019-10-25 DIAGNOSIS — G9340 Encephalopathy, unspecified: Secondary | ICD-10-CM | POA: Diagnosis not present

## 2019-10-25 DIAGNOSIS — G9341 Metabolic encephalopathy: Secondary | ICD-10-CM | POA: Diagnosis not present

## 2019-10-25 DIAGNOSIS — R2689 Other abnormalities of gait and mobility: Secondary | ICD-10-CM | POA: Diagnosis not present

## 2019-10-25 DIAGNOSIS — Z86010 Personal history of colonic polyps: Secondary | ICD-10-CM | POA: Diagnosis not present

## 2019-10-25 DIAGNOSIS — J449 Chronic obstructive pulmonary disease, unspecified: Secondary | ICD-10-CM | POA: Diagnosis not present

## 2019-10-25 DIAGNOSIS — J9611 Chronic respiratory failure with hypoxia: Secondary | ICD-10-CM | POA: Diagnosis not present

## 2019-10-25 DIAGNOSIS — E039 Hypothyroidism, unspecified: Secondary | ICD-10-CM | POA: Diagnosis not present

## 2019-10-25 DIAGNOSIS — I10 Essential (primary) hypertension: Secondary | ICD-10-CM | POA: Diagnosis not present

## 2019-10-25 DIAGNOSIS — J441 Chronic obstructive pulmonary disease with (acute) exacerbation: Secondary | ICD-10-CM | POA: Diagnosis not present

## 2019-10-25 DIAGNOSIS — D0220 Carcinoma in situ of unspecified bronchus and lung: Secondary | ICD-10-CM | POA: Diagnosis not present

## 2019-10-25 DIAGNOSIS — I160 Hypertensive urgency: Secondary | ICD-10-CM | POA: Diagnosis not present

## 2019-10-25 DIAGNOSIS — M6281 Muscle weakness (generalized): Secondary | ICD-10-CM | POA: Diagnosis not present

## 2019-10-25 DIAGNOSIS — Z720 Tobacco use: Secondary | ICD-10-CM | POA: Diagnosis not present

## 2019-10-25 LAB — CBC WITH DIFFERENTIAL/PLATELET
Abs Immature Granulocytes: 0.05 10*3/uL (ref 0.00–0.07)
Basophils Absolute: 0 10*3/uL (ref 0.0–0.1)
Basophils Relative: 0 %
Eosinophils Absolute: 0 10*3/uL (ref 0.0–0.5)
Eosinophils Relative: 0 %
HCT: 41.9 % (ref 39.0–52.0)
Hemoglobin: 13.7 g/dL (ref 13.0–17.0)
Immature Granulocytes: 0 %
Lymphocytes Relative: 5 %
Lymphs Abs: 0.6 10*3/uL — ABNORMAL LOW (ref 0.7–4.0)
MCH: 31.2 pg (ref 26.0–34.0)
MCHC: 32.7 g/dL (ref 30.0–36.0)
MCV: 95.4 fL (ref 80.0–100.0)
Monocytes Absolute: 0.5 10*3/uL (ref 0.1–1.0)
Monocytes Relative: 4 %
Neutro Abs: 11.6 10*3/uL — ABNORMAL HIGH (ref 1.7–7.7)
Neutrophils Relative %: 91 %
Platelets: 216 10*3/uL (ref 150–400)
RBC: 4.39 MIL/uL (ref 4.22–5.81)
RDW: 14.4 % (ref 11.5–15.5)
WBC: 12.8 10*3/uL — ABNORMAL HIGH (ref 4.0–10.5)
nRBC: 0 % (ref 0.0–0.2)

## 2019-10-25 LAB — BASIC METABOLIC PANEL
Anion gap: 10 (ref 5–15)
BUN: 24 mg/dL — ABNORMAL HIGH (ref 8–23)
CO2: 27 mmol/L (ref 22–32)
Calcium: 8.6 mg/dL — ABNORMAL LOW (ref 8.9–10.3)
Chloride: 96 mmol/L — ABNORMAL LOW (ref 98–111)
Creatinine, Ser: 0.71 mg/dL (ref 0.61–1.24)
GFR calc Af Amer: >60 mL/min (ref >60)
GFR calc non Af Amer: >60 mL/min (ref >60)
Glucose, Bld: 111 mg/dL — ABNORMAL HIGH (ref 70–99)
Potassium: 3.5 mmol/L (ref 3.5–5.1)
Sodium: 133 mmol/L — ABNORMAL LOW (ref 135–145)

## 2019-10-25 LAB — SARS CORONAVIRUS 2 BY RT PCR (HOSPITAL ORDER, PERFORMED IN ~~LOC~~ HOSPITAL LAB): SARS Coronavirus 2: NEGATIVE

## 2019-10-25 LAB — MAGNESIUM: Magnesium: 2.1 mg/dL (ref 1.7–2.4)

## 2019-10-25 MED ORDER — PREDNISONE 20 MG PO TABS: 40.0000 mg | ORAL_TABLET | Freq: Every day | ORAL | 0 refills | Status: AC

## 2019-10-25 MED ORDER — BUDESONIDE-FORMOTEROL FUMARATE 160-4.5 MCG/ACT IN AERO: 2.0000 | INHALATION_SPRAY | Freq: Two times a day (BID) | RESPIRATORY_TRACT | 0 refills | Status: DC

## 2019-10-25 MED ORDER — AMLODIPINE BESYLATE 10 MG PO TABS
10.0000 mg | ORAL_TABLET | Freq: Every day | ORAL | 0 refills | Status: DC
Start: 2019-10-25 — End: 2020-04-06

## 2019-10-25 NOTE — TOC Transition Note (Signed)
Transition of Care [facility]) - CM/SW Discharge Note   Patient Details  Name: Ian Aguilar MRN: [id number] Date of Birth: 05-16-1940  Transition of Care [facility]) CM/SW Contact:  Salome Arnt, LCSW Phone Number: 10/25/2019, 11:57 AM   Clinical Narrative:  Pt d/c today to [facility]. Pt's wife notified as well as facility. Pt will go to room B17, bed 1. RN given number to call report. Pt's wife will provide transportation with staff at hospital and SNF to assist with getting in and out of car. COVID test negative today. D/C summary sent to SNF.      Final next level of care: Skilled Nursing Facility Barriers to Discharge: Barriers Resolved   Patient Goals and CMS Choice Patient states their goals for this hospitalization and ongoing recovery are:: Discharge to SNF CMS Medicare.gov Compare Post Acute Care list provided to:: Patient Represenative (must comment) [name] (wife)) Choice offered to / list presented to : Spouse  Discharge Placement                Patient to be transferred to facility by: wife Name of family member notified: [name]- wife Patient and family notified of of transfer: 10/25/19  Discharge Plan and Services In-house Referral: Clinical Social Work Discharge Planning Services: NA Post Acute Care Choice: [place]          DME Arranged: N/A DME Agency: NA       HH Arranged: NA HH Agency: NA        Social Determinants of Health (SDOH) Interventions     Readmission Risk Interventions No flowsheet data found.

## 2019-10-25 NOTE — Discharge Summary (Addendum)
Physician Discharge Summary  GREER KOEPPEN WJX:[id number] DOB: [birth year] DOA: 10/22/2019  PCP: Sharilyn Sites, MD  Admit date: 10/22/2019 Discharge date: 10/25/2019  Admitted From: Home Disposition: SNF  Recommendations for Outpatient Follow-up:  1. Follow up with SNF provider at earliest convenience 2. Outpatient follow-up with palliative care is recommended 3. Recommend outpatient evaluation and follow-up by neurology 4. 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  On presentation, chest x-ray showed no acute focal abnormality; COVID-19 test was negative.  CT of the head was negative for acute intracranial abnormality.  Patient was started on IV fluids.  During hospitalization, his overall condition gradually improved.  MRI of the brain was negative for any acute infarct.  His mental status is improving but still slow to respond.  He was also treated with IV Solu-Medrol for COPD exacerbation.  Subsequently IV fluids have been discontinued.  PT recommended SNF placement.  He will be discharged to SNF once bed is available.  Discharge Diagnoses:   Acute metabolic encephalopathy -Likely due to dehydration and hypoxia -CT of the head was negative for acute intracranial abnormality.  MRI of the brain was negative for any acute infarct.  Monitor mental status.  Fall precautions.  PT/OT recommend SNF placement.  Chest x-ray was not suggestive  of pneumonia.  UA was not suggestive of infection.  No leukocytosis on presentation.  Ammonia was 25.  TSH 1.653, vitamin B12 347.  Procalcitonin was less than 0.1 -Mental status slowly improving, still slow to respond to questions and slightly confused to time.   -Recommend outpatient neurology evaluation and follow-up -Discharge patient to SNF once bed is available  Dehydration -Due to very poor oral intake.    Treated with IV fluids and subsequently discontinued.  COPD exacerbation Chronic hypoxic respiratory failure -Wears 2 L oxygen at home.  Currently on 3 L oxygen. -Treated with IV Solu-Medrol, doxycycline, nebs and Dulera.  Respiratory status stable.  Switch Solu-Medrol to oral prednisone 40 mg daily for 7 days.  Continue as needed albuterol.  Discharged on Symbicort.  Might need outpatient pulmonary follow-up  Stage II right upper lobe adenocarcinoma: Status post chemo and radiation. Followed by oncology.Chest x-ray showing stable right apical nodule. -outpatient oncology follow-up  Hypertensive urgency: Upon arrival to the ED, blood pressure elevated with systolic in the 213Y.  Not on any antihypertensives at home.  Started on oral amlodipine 10 mg daily.  Blood pressure improving but still on the higher side might need further adjustments upon discharge.  Hyperlipidemia -Continue Crestor  Hypothyroidism -Continue Synthroid.   Liver cirrhosis: Followed by GI and cirrhosis felt to be secondary to alcohol use in the past. Appears well compensated at this time. -Ammonia 25.  Outpatient GI evaluation and follow-up  Generalized weakness -PT/OT recommend SNF placement -Palliative care consultation appreciated.  Remains full code.  Overall prognosis is guarded to poor.  Recommend outpatient palliative care follow-up  Discharge Instructions  Discharge Instructions    Ambulatory referral to Neurology   Complete by: As directed    An appointment is requested in  approximately: few weeks for hospital follow-up for altered mental status   Diet - low sodium heart healthy   Complete by: As directed    Increase activity slowly   Complete by: As directed      Allergies as of 10/25/2019   No Known Allergies     Medication List    TAKE these medications   albuterol 108 (90 Base) MCG/ACT inhaler Commonly known as: VENTOLIN HFA Inhale 2 puffs into the lungs daily.   amLODipine 10 MG tablet Commonly known as: NORVASC Take 1 tablet (10 mg total) by mouth daily.   budesonide-formoterol 160-4.5 MCG/ACT inhaler Commonly known as: Symbicort Inhale 2 puffs into the lungs in the morning and at bedtime.   cholecalciferol 25 MCG (1000 UNIT) tablet Commonly known as: VITAMIN D Take 1,000 Units by mouth daily.   guaiFENesin 600 MG 12 hr tablet Commonly known as: MUCINEX Take 600 mg by mouth daily.   levothyroxine 100 MCG tablet Commonly known as: SYNTHROID TAKE 1 TABLET(100 MCG) BY MOUTH DAILY BEFORE BREAKFAST What changed: See the new instructions.   meclizine 25 MG tablet Commonly known as: ANTIVERT Take 25 mg by mouth as needed for dizziness.   predniSONE 20 MG tablet Commonly known as: Deltasone Take 2 tablets (40 mg total) by mouth daily for 7 days.   rosuvastatin 10 MG tablet Commonly known as: CRESTOR Take 10 mg by mouth daily.   Soothe 0.6-0.6 % Soln Generic drug: Propylene Glycol-Glycerin Place 1-2 drops into both eyes as needed (for dry/irritated eyes.).   Spiriva HandiHaler 18 MCG inhalation capsule Generic drug: tiotropium Place 18 mcg into inhaler and inhale daily.        Contact information for after-discharge care    [facility] .   Service: Skilled Nursing Contact information: [address] [place] [phone]                 No Known Allergies  Consultations:  Palliative care   Procedures/Studies: CT Head Wo  Contrast  Result Date: 10/22/2019 CLINICAL DATA:  Mental status change EXAM: CT HEAD WITHOUT CONTRAST TECHNIQUE: Contiguous axial images were obtained from the base of the skull through the vertex without intravenous contrast. COMPARISON:  MRI 04/29/2018 FINDINGS: Brain: No acute territorial infarction, hemorrhage or intracranial mass. Mild to moderate atrophy. Mild hypodensity in the white matter consistent with chronic small vessel ischemic change. Nonenlarged ventricles Vascular: No hyperdense vessels.  Carotid vascular calcification Skull: Normal. Negative for fracture or focal lesion. Sinuses/Orbits: No acute finding. Other: None IMPRESSION: 1. No CT evidence for acute intracranial abnormality. 2. Atrophy and mild chronic small vessel ischemic changes of the white matter. Electronically Signed   By: Donavan Foil M.D.   On: 10/22/2019 21:41   MR BRAIN WO CONTRAST  Result Date: 10/23/2019 CLINICAL DATA:  Mental status change EXAM: MRI HEAD WITHOUT CONTRAST TECHNIQUE: Multiplanar, multiecho pulse sequences of the brain and surrounding structures were obtained without intravenous contrast. COMPARISON:  04/29/2018 FINDINGS: Motion artifact is present. Gradient echo and volumetric T1 sequences are nondiagnostic. Brain: There is no acute infarction. There is no intracranial mass, mass effect, or edema. There is no hydrocephalus or extra-axial fluid collection. 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 Patchy and confluent areas of T2 hyperintensity in the supratentorial white matter are nonspecific but may reflect mild to moderate microvascular ischemic changes. Vascular: Major vessel flow voids at the  skull base are preserved. Skull and upper cervical spine: Normal marrow signal is preserved. Sinuses/Orbits: Paranasal sinuses are aerated. Bilateral lens replacements. Other: Sella is unremarkable. Mild patchy right mastoid fluid  opacification. IMPRESSION: No evidence of recent infarction. No mass. Stable chronic findings detailed above. Electronically Signed   By: Macy Mis M.D.   On: 10/23/2019 19:25   DG Chest Port 1 View  Result Date: 10/22/2019 CLINICAL DATA:  Cough and shortness of breath EXAM: PORTABLE CHEST 1 VIEW COMPARISON:  06/16/2019 FINDINGS: Cardiac shadow is within normal limits. The lungs are well aerated bilaterally. Medial right apical nodule is noted stable from the prior CT examination. No focal infiltrate or effusion is seen. No bony abnormality is noted. Left chest wall port is noted satisfactory position. Aortic calcifications are noted. IMPRESSION: Stable right apical nodule. No other focal abnormality is seen. Electronically Signed   By: Inez Catalina M.D.   On: 10/22/2019 17:13       Subjective: Patient seen and examined at bedside.  He is awake, poor historian.  Still slightly confused today.  Feels better and thinks that he is ready for discharge.  No overnight fever, vomiting or worsening shortness of breath reported.  Discharge Exam: Vitals:   10/25/19 0509 10/25/19 0742  BP: (!) 168/88   Pulse: 71   Resp: 18   Temp: 98.1 F (36.7 C)   SpO2: 97% 90%    General: Pt is alert, awake, not in acute distress.  Slow to respond, slightly confused to time Cardiovascular: rate controlled, S1/S2 + Respiratory: bilateral decreased breath sounds at bases with some scattered crackles Abdominal: Soft, NT, ND, bowel sounds + Extremities: Trace lower extremity edema; no cyanosis    The results of significant diagnostics from this hospitalization (including imaging, microbiology, ancillary and laboratory) are listed below for reference.     Microbiology: Recent Results (from the past 240 hour(s))  SARS Coronavirus 2 by RT PCR (hospital order, performed in [facility] hospital lab) Nasopharyngeal Nasopharyngeal Swab     Status: None   Collection Time: 10/22/19  3:53 PM   Specimen:  Nasopharyngeal Swab  Result Value Ref Range Status   SARS Coronavirus 2 NEGATIVE NEGATIVE Final    Comment: (NOTE) SARS-CoV-2 target nucleic acids are NOT DETECTED.  The SARS-CoV-2 RNA is generally detectable in upper and lower respiratory specimens during the acute phase of infection. The lowest concentration of SARS-CoV-2 viral copies this assay can detect is 250 copies / mL. A negative result does not preclude SARS-CoV-2 infection and should not be used as the sole basis for treatment or other patient management decisions.  A negative result may occur with improper specimen collection / handling, submission of specimen other than nasopharyngeal swab, presence of viral mutation(s) within the areas targeted by this assay, and inadequate number of viral copies (<250 copies / mL). A negative result must be combined with clinical observations, patient history, and epidemiological information.  Fact Sheet for Patients:   StrictlyIdeas.no  Fact Sheet for Healthcare Providers: BankingDealers.co.za  This test is not yet approved or  cleared by the [place] FDA and has been authorized for detection and/or diagnosis of SARS-CoV-2 by FDA under an Emergency Use Authorization (EUA).  This EUA will remain in effect (meaning this test can be used) for the duration of the COVID-19 declaration under Section 564(b)(1) of the Act, 21 U.S.C. section 360bbb-3(b)(1), unless the authorization is terminated or revoked sooner.  Performed at [facility], [address] [place], [place] [address]  Labs: BNP (last 3 results) Recent Labs    10/22/19 1547  BNP 182.9*   Basic Metabolic Panel: Recent Labs  Lab 10/22/19 1547 10/24/19 0602 10/25/19 0621  NA 138 136 133*  K 3.5 3.7 3.5  CL 97* 99 96*  CO2 30 28 27   GLUCOSE 104* 147* 111*  BUN 32* 25* 24*  CREATININE 1.02 0.84 0.71  CALCIUM 8.8* 8.9 8.6*  MG  --  1.9 2.1   Liver  Function Tests: Recent Labs  Lab 10/24/19 0602  AST 53*  ALT 27  ALKPHOS 55  BILITOT 1.0  PROT 6.4*  ALBUMIN 3.6   No results for input(s): LIPASE, AMYLASE in the last 168 hours. Recent Labs  Lab 10/23/19 0029  AMMONIA 25   CBC: Recent Labs  Lab 10/22/19 1547 10/24/19 0602 10/25/19 0621  WBC 5.4 8.1 12.8*  NEUTROABS  --  7.2 11.6*  HGB 13.4 13.0 13.7  HCT 41.8 40.4 41.9  MCV 97.7 96.4 95.4  PLT 185 181 216   Cardiac Enzymes: No results for input(s): CKTOTAL, CKMB, CKMBINDEX, TROPONINI in the last 168 hours. BNP: Invalid input(s): POCBNP CBG: No results for input(s): GLUCAP in the last 168 hours. D-Dimer No results for input(s): DDIMER in the last 72 hours. Hgb A1c No results for input(s): HGBA1C in the last 72 hours. Lipid Profile No results for input(s): CHOL, HDL, LDLCALC, TRIG, CHOLHDL, LDLDIRECT in the last 72 hours. Thyroid function studies Recent Labs    10/22/19 1547  TSH 1.653   Anemia work up Recent Labs    10/22/19 1547  VITAMINB12 347   Urinalysis    Component Value Date/Time   COLORURINE YELLOW 10/22/2019 2111   APPEARANCEUR CLEAR 10/22/2019 2111   LABSPEC 1.030 10/22/2019 2111   PHURINE 5.0 10/22/2019 2111   GLUCOSEU NEGATIVE 10/22/2019 2111   HGBUR SMALL (A) 10/22/2019 2111   BILIRUBINUR NEGATIVE 10/22/2019 2111   KETONESUR 5 (A) 10/22/2019 2111   PROTEINUR 100 (A) 10/22/2019 2111   NITRITE NEGATIVE 10/22/2019 2111   LEUKOCYTESUR NEGATIVE 10/22/2019 2111   Sepsis Labs Invalid input(s): PROCALCITONIN,  WBC,  LACTICIDVEN Microbiology Recent Results (from the past 240 hour(s))  SARS Coronavirus 2 by RT PCR (hospital order, performed in [place] hospital lab) Nasopharyngeal Nasopharyngeal Swab     Status: None   Collection Time: 10/22/19  3:53 PM   Specimen: Nasopharyngeal Swab  Result Value Ref Range Status   SARS Coronavirus 2 NEGATIVE NEGATIVE Final    Comment: (NOTE) SARS-CoV-2 target nucleic acids are NOT  DETECTED.  The SARS-CoV-2 RNA is generally detectable in upper and lower respiratory specimens during the acute phase of infection. The lowest concentration of SARS-CoV-2 viral copies this assay can detect is 250 copies / mL. A negative result does not preclude SARS-CoV-2 infection and should not be used as the sole basis for treatment or other patient management decisions.  A negative result may occur with improper specimen collection / handling, submission of specimen other than nasopharyngeal swab, presence of viral mutation(s) within the areas targeted by this assay, and inadequate number of viral copies (<250 copies / mL). A negative result must be combined with clinical observations, patient history, and epidemiological information.  Fact Sheet for Patients:   StrictlyIdeas.no  Fact Sheet for Healthcare Providers: BankingDealers.co.za  This test is not yet approved or  cleared by the [place] FDA and has been authorized for detection and/or diagnosis of SARS-CoV-2 by FDA under an Emergency Use Authorization (EUA).  This EUA will  remain in effect (meaning this test can be used) for the duration of the COVID-19 declaration under Section 564(b)(1) of the Act, 21 U.S.C. section 360bbb-3(b)(1), unless the authorization is terminated or revoked sooner.  Performed at [facility], [address], [place],  [address]      Time coordinating discharge: 35 minutes  SIGNED:   Aline August, MD  [facility] 10/25/2019, 8:30 AM

## 2019-10-25 NOTE — Progress Notes (Signed)
Patient gave permission to speak his daughter, Venkat Ankney. Update provided to daughter via phone.  Elodia Florence RN

## 2019-10-27 DIAGNOSIS — G9341 Metabolic encephalopathy: Secondary | ICD-10-CM | POA: Diagnosis not present

## 2019-10-27 DIAGNOSIS — R531 Weakness: Secondary | ICD-10-CM | POA: Diagnosis not present

## 2019-10-27 DIAGNOSIS — K7030 Alcoholic cirrhosis of liver without ascites: Secondary | ICD-10-CM | POA: Diagnosis not present

## 2019-10-27 DIAGNOSIS — E039 Hypothyroidism, unspecified: Secondary | ICD-10-CM | POA: Diagnosis not present

## 2019-10-27 DIAGNOSIS — C3411 Malignant neoplasm of upper lobe, right bronchus or lung: Secondary | ICD-10-CM | POA: Diagnosis not present

## 2019-10-27 DIAGNOSIS — J441 Chronic obstructive pulmonary disease with (acute) exacerbation: Secondary | ICD-10-CM | POA: Diagnosis not present

## 2019-10-27 DIAGNOSIS — I10 Essential (primary) hypertension: Secondary | ICD-10-CM | POA: Diagnosis not present

## 2019-10-27 DIAGNOSIS — E860 Dehydration: Secondary | ICD-10-CM | POA: Diagnosis not present

## 2019-10-28 DIAGNOSIS — J449 Chronic obstructive pulmonary disease, unspecified: Secondary | ICD-10-CM | POA: Diagnosis not present

## 2019-10-28 DIAGNOSIS — Z720 Tobacco use: Secondary | ICD-10-CM | POA: Diagnosis not present

## 2019-10-28 DIAGNOSIS — E039 Hypothyroidism, unspecified: Secondary | ICD-10-CM | POA: Diagnosis not present

## 2019-10-29 DIAGNOSIS — Z86010 Personal history of colonic polyps: Secondary | ICD-10-CM | POA: Diagnosis not present

## 2019-10-29 DIAGNOSIS — J441 Chronic obstructive pulmonary disease with (acute) exacerbation: Secondary | ICD-10-CM | POA: Diagnosis not present

## 2019-10-29 DIAGNOSIS — I10 Essential (primary) hypertension: Secondary | ICD-10-CM | POA: Diagnosis not present

## 2019-10-29 DIAGNOSIS — J449 Chronic obstructive pulmonary disease, unspecified: Secondary | ICD-10-CM | POA: Diagnosis not present

## 2019-10-29 DIAGNOSIS — D0220 Carcinoma in situ of unspecified bronchus and lung: Secondary | ICD-10-CM | POA: Diagnosis not present

## 2019-10-29 DIAGNOSIS — C3411 Malignant neoplasm of upper lobe, right bronchus or lung: Secondary | ICD-10-CM | POA: Diagnosis not present

## 2019-10-29 DIAGNOSIS — G9341 Metabolic encephalopathy: Secondary | ICD-10-CM | POA: Diagnosis not present

## 2019-10-29 DIAGNOSIS — K7030 Alcoholic cirrhosis of liver without ascites: Secondary | ICD-10-CM | POA: Diagnosis not present

## 2019-10-29 DIAGNOSIS — R2689 Other abnormalities of gait and mobility: Secondary | ICD-10-CM | POA: Diagnosis not present

## 2019-10-29 DIAGNOSIS — M6281 Muscle weakness (generalized): Secondary | ICD-10-CM | POA: Diagnosis not present

## 2019-11-07 DIAGNOSIS — J449 Chronic obstructive pulmonary disease, unspecified: Secondary | ICD-10-CM | POA: Diagnosis not present

## 2019-11-15 DIAGNOSIS — J441 Chronic obstructive pulmonary disease with (acute) exacerbation: Secondary | ICD-10-CM | POA: Diagnosis not present

## 2019-11-15 DIAGNOSIS — E785 Hyperlipidemia, unspecified: Secondary | ICD-10-CM | POA: Diagnosis not present

## 2019-11-15 DIAGNOSIS — M1990 Unspecified osteoarthritis, unspecified site: Secondary | ICD-10-CM | POA: Diagnosis not present

## 2019-11-15 DIAGNOSIS — I10 Essential (primary) hypertension: Secondary | ICD-10-CM | POA: Diagnosis not present

## 2019-11-15 DIAGNOSIS — F418 Other specified anxiety disorders: Secondary | ICD-10-CM | POA: Diagnosis not present

## 2019-11-15 DIAGNOSIS — I160 Hypertensive urgency: Secondary | ICD-10-CM | POA: Diagnosis not present

## 2019-11-15 DIAGNOSIS — J9611 Chronic respiratory failure with hypoxia: Secondary | ICD-10-CM | POA: Diagnosis not present

## 2019-11-15 DIAGNOSIS — C3411 Malignant neoplasm of upper lobe, right bronchus or lung: Secondary | ICD-10-CM | POA: Diagnosis not present

## 2019-11-15 DIAGNOSIS — F1011 Alcohol abuse, in remission: Secondary | ICD-10-CM | POA: Diagnosis not present

## 2019-11-15 DIAGNOSIS — R42 Dizziness and giddiness: Secondary | ICD-10-CM | POA: Diagnosis not present

## 2019-11-15 DIAGNOSIS — E039 Hypothyroidism, unspecified: Secondary | ICD-10-CM | POA: Diagnosis not present

## 2019-11-15 DIAGNOSIS — G9341 Metabolic encephalopathy: Secondary | ICD-10-CM | POA: Diagnosis not present

## 2019-11-15 DIAGNOSIS — K7030 Alcoholic cirrhosis of liver without ascites: Secondary | ICD-10-CM | POA: Diagnosis not present

## 2019-11-19 DIAGNOSIS — M4714 Other spondylosis with myelopathy, thoracic region: Secondary | ICD-10-CM | POA: Diagnosis not present

## 2019-11-19 DIAGNOSIS — M21371 Foot drop, right foot: Secondary | ICD-10-CM | POA: Diagnosis not present

## 2019-11-19 DIAGNOSIS — G9341 Metabolic encephalopathy: Secondary | ICD-10-CM | POA: Diagnosis not present

## 2019-11-19 DIAGNOSIS — G9520 Unspecified cord compression: Secondary | ICD-10-CM | POA: Diagnosis not present

## 2019-11-19 DIAGNOSIS — G959 Disease of spinal cord, unspecified: Secondary | ICD-10-CM | POA: Diagnosis not present

## 2019-11-24 DIAGNOSIS — C3490 Malignant neoplasm of unspecified part of unspecified bronchus or lung: Secondary | ICD-10-CM | POA: Diagnosis not present

## 2019-11-24 DIAGNOSIS — J449 Chronic obstructive pulmonary disease, unspecified: Secondary | ICD-10-CM | POA: Diagnosis not present

## 2019-11-24 DIAGNOSIS — J439 Emphysema, unspecified: Secondary | ICD-10-CM | POA: Diagnosis not present

## 2019-11-24 DIAGNOSIS — E7849 Other hyperlipidemia: Secondary | ICD-10-CM | POA: Diagnosis not present

## 2019-11-24 DIAGNOSIS — Z6824 Body mass index (BMI) 24.0-24.9, adult: Secondary | ICD-10-CM | POA: Diagnosis not present

## 2019-11-25 DIAGNOSIS — B351 Tinea unguium: Secondary | ICD-10-CM | POA: Diagnosis not present

## 2019-11-25 DIAGNOSIS — M79674 Pain in right toe(s): Secondary | ICD-10-CM | POA: Diagnosis not present

## 2019-11-27 DIAGNOSIS — J449 Chronic obstructive pulmonary disease, unspecified: Secondary | ICD-10-CM | POA: Diagnosis not present

## 2019-11-27 DIAGNOSIS — M169 Osteoarthritis of hip, unspecified: Secondary | ICD-10-CM | POA: Diagnosis not present

## 2019-11-27 DIAGNOSIS — E039 Hypothyroidism, unspecified: Secondary | ICD-10-CM | POA: Diagnosis not present

## 2019-11-28 DIAGNOSIS — C3411 Malignant neoplasm of upper lobe, right bronchus or lung: Secondary | ICD-10-CM | POA: Diagnosis not present

## 2019-11-28 DIAGNOSIS — J441 Chronic obstructive pulmonary disease with (acute) exacerbation: Secondary | ICD-10-CM | POA: Diagnosis not present

## 2019-11-28 DIAGNOSIS — I160 Hypertensive urgency: Secondary | ICD-10-CM | POA: Diagnosis not present

## 2019-11-28 DIAGNOSIS — M1990 Unspecified osteoarthritis, unspecified site: Secondary | ICD-10-CM | POA: Diagnosis not present

## 2019-11-28 DIAGNOSIS — E039 Hypothyroidism, unspecified: Secondary | ICD-10-CM | POA: Diagnosis not present

## 2019-11-28 DIAGNOSIS — R42 Dizziness and giddiness: Secondary | ICD-10-CM | POA: Diagnosis not present

## 2019-11-28 DIAGNOSIS — F418 Other specified anxiety disorders: Secondary | ICD-10-CM | POA: Diagnosis not present

## 2019-11-28 DIAGNOSIS — I10 Essential (primary) hypertension: Secondary | ICD-10-CM | POA: Diagnosis not present

## 2019-11-28 DIAGNOSIS — J9611 Chronic respiratory failure with hypoxia: Secondary | ICD-10-CM | POA: Diagnosis not present

## 2019-11-28 DIAGNOSIS — K7030 Alcoholic cirrhosis of liver without ascites: Secondary | ICD-10-CM | POA: Diagnosis not present

## 2019-11-28 DIAGNOSIS — G9341 Metabolic encephalopathy: Secondary | ICD-10-CM | POA: Diagnosis not present

## 2019-11-28 DIAGNOSIS — E785 Hyperlipidemia, unspecified: Secondary | ICD-10-CM | POA: Diagnosis not present

## 2019-11-28 DIAGNOSIS — F1011 Alcohol abuse, in remission: Secondary | ICD-10-CM | POA: Diagnosis not present

## 2019-12-01 ENCOUNTER — Other Ambulatory Visit: Payer: Self-pay | Admitting: Neurology

## 2019-12-01 ENCOUNTER — Other Ambulatory Visit (HOSPITAL_COMMUNITY): Payer: Self-pay | Admitting: Neurology

## 2019-12-01 DIAGNOSIS — M4714 Other spondylosis with myelopathy, thoracic region: Secondary | ICD-10-CM

## 2019-12-01 DIAGNOSIS — G959 Disease of spinal cord, unspecified: Secondary | ICD-10-CM

## 2019-12-01 DIAGNOSIS — G9520 Unspecified cord compression: Secondary | ICD-10-CM

## 2019-12-04 ENCOUNTER — Ambulatory Visit: Payer: PPO | Admitting: Neurology

## 2019-12-05 DIAGNOSIS — E063 Autoimmune thyroiditis: Secondary | ICD-10-CM | POA: Diagnosis not present

## 2019-12-05 DIAGNOSIS — Z23 Encounter for immunization: Secondary | ICD-10-CM | POA: Diagnosis not present

## 2019-12-05 DIAGNOSIS — J449 Chronic obstructive pulmonary disease, unspecified: Secondary | ICD-10-CM | POA: Diagnosis not present

## 2019-12-05 DIAGNOSIS — J9610 Chronic respiratory failure, unspecified whether with hypoxia or hypercapnia: Secondary | ICD-10-CM | POA: Diagnosis not present

## 2019-12-05 DIAGNOSIS — Z6824 Body mass index (BMI) 24.0-24.9, adult: Secondary | ICD-10-CM | POA: Diagnosis not present

## 2019-12-07 DIAGNOSIS — J449 Chronic obstructive pulmonary disease, unspecified: Secondary | ICD-10-CM | POA: Diagnosis not present

## 2019-12-16 ENCOUNTER — Telehealth: Payer: Self-pay | Admitting: Internal Medicine

## 2019-12-16 ENCOUNTER — Inpatient Hospital Stay (HOSPITAL_COMMUNITY): Payer: PPO | Attending: Hematology

## 2019-12-16 ENCOUNTER — Other Ambulatory Visit: Payer: Self-pay

## 2019-12-16 ENCOUNTER — Other Ambulatory Visit (HOSPITAL_COMMUNITY): Payer: PPO

## 2019-12-16 ENCOUNTER — Ambulatory Visit (HOSPITAL_COMMUNITY)
Admission: RE | Admit: 2019-12-16 | Discharge: 2019-12-16 | Disposition: A | Discharge status: Home / Self Care | Payer: PPO | Source: Ambulatory Visit | Attending: Hematology | Admitting: Hematology

## 2019-12-16 DIAGNOSIS — C3411 Malignant neoplasm of upper lobe, right bronchus or lung: Secondary | ICD-10-CM | POA: Insufficient documentation

## 2019-12-16 DIAGNOSIS — I700 Atherosclerosis of aorta: Secondary | ICD-10-CM | POA: Diagnosis not present

## 2019-12-16 DIAGNOSIS — J984 Other disorders of lung: Secondary | ICD-10-CM | POA: Diagnosis not present

## 2019-12-16 DIAGNOSIS — F17210 Nicotine dependence, cigarettes, uncomplicated: Secondary | ICD-10-CM | POA: Insufficient documentation

## 2019-12-16 DIAGNOSIS — C3491 Malignant neoplasm of unspecified part of right bronchus or lung: Secondary | ICD-10-CM

## 2019-12-16 DIAGNOSIS — Z79899 Other long term (current) drug therapy: Secondary | ICD-10-CM | POA: Diagnosis not present

## 2019-12-16 DIAGNOSIS — I10 Essential (primary) hypertension: Secondary | ICD-10-CM | POA: Diagnosis not present

## 2019-12-16 DIAGNOSIS — G629 Polyneuropathy, unspecified: Secondary | ICD-10-CM | POA: Insufficient documentation

## 2019-12-16 DIAGNOSIS — K7460 Unspecified cirrhosis of liver: Secondary | ICD-10-CM | POA: Insufficient documentation

## 2019-12-16 DIAGNOSIS — E039 Hypothyroidism, unspecified: Secondary | ICD-10-CM | POA: Insufficient documentation

## 2019-12-16 DIAGNOSIS — J439 Emphysema, unspecified: Secondary | ICD-10-CM | POA: Diagnosis not present

## 2019-12-16 LAB — CBC WITH DIFFERENTIAL/PLATELET
Abs Immature Granulocytes: 0.01 10*3/uL (ref 0.00–0.07)
Basophils Absolute: 0 10*3/uL (ref 0.0–0.1)
Basophils Relative: 1 %
Eosinophils Absolute: 0.1 10*3/uL (ref 0.0–0.5)
Eosinophils Relative: 2 %
HCT: 40.2 % (ref 39.0–52.0)
Hemoglobin: 12.8 g/dL — ABNORMAL LOW (ref 13.0–17.0)
Immature Granulocytes: 0 %
Lymphocytes Relative: 15 %
Lymphs Abs: 0.8 10*3/uL (ref 0.7–4.0)
MCH: 30.3 pg (ref 26.0–34.0)
MCHC: 31.8 g/dL (ref 30.0–36.0)
MCV: 95.3 fL (ref 80.0–100.0)
Monocytes Absolute: 0.4 10*3/uL (ref 0.1–1.0)
Monocytes Relative: 8 %
Neutro Abs: 3.8 10*3/uL (ref 1.7–7.7)
Neutrophils Relative %: 74 %
Platelets: 222 10*3/uL (ref 150–400)
RBC: 4.22 MIL/uL (ref 4.22–5.81)
RDW: 14.6 % (ref 11.5–15.5)
WBC: 5.1 10*3/uL (ref 4.0–10.5)
nRBC: 0 % (ref 0.0–0.2)

## 2019-12-16 LAB — COMPREHENSIVE METABOLIC PANEL
ALT: 14 U/L (ref 0–44)
AST: 21 U/L (ref 15–41)
Albumin: 3.9 g/dL (ref 3.5–5.0)
Alkaline Phosphatase: 86 U/L (ref 38–126)
Anion gap: 13 (ref 5–15)
BUN: 10 mg/dL (ref 8–23)
CO2: 29 mmol/L (ref 22–32)
Calcium: 9.4 mg/dL (ref 8.9–10.3)
Chloride: 99 mmol/L (ref 98–111)
Creatinine, Ser: 0.89 mg/dL (ref 0.61–1.24)
GFR, Estimated: >60 mL/min (ref >60)
Glucose, Bld: 95 mg/dL (ref 70–99)
Potassium: 4 mmol/L (ref 3.5–5.1)
Sodium: 141 mmol/L (ref 135–145)
Total Bilirubin: 1.1 mg/dL (ref 0.3–1.2)
Total Protein: 7.2 g/dL (ref 6.5–8.1)

## 2019-12-16 MED ORDER — IOHEXOL 300 MG/ML  SOLN
75.0000 mL | Freq: Once | INTRAMUSCULAR | Status: AC | PRN
  Administered 2019-12-16: 75 mL via INTRAVENOUS

## 2019-12-16 NOTE — Telephone Encounter (Signed)
Recall for repeat ultrasound

## 2019-12-17 NOTE — Telephone Encounter (Signed)
Recall sent 

## 2019-12-22 DIAGNOSIS — I10 Essential (primary) hypertension: Secondary | ICD-10-CM | POA: Diagnosis not present

## 2019-12-22 DIAGNOSIS — C3411 Malignant neoplasm of upper lobe, right bronchus or lung: Secondary | ICD-10-CM | POA: Diagnosis not present

## 2019-12-22 DIAGNOSIS — J9611 Chronic respiratory failure with hypoxia: Secondary | ICD-10-CM | POA: Diagnosis not present

## 2019-12-22 DIAGNOSIS — G9341 Metabolic encephalopathy: Secondary | ICD-10-CM | POA: Diagnosis not present

## 2019-12-23 ENCOUNTER — Other Ambulatory Visit (HOSPITAL_COMMUNITY): Payer: Self-pay | Admitting: *Deleted

## 2019-12-23 ENCOUNTER — Other Ambulatory Visit: Payer: Self-pay

## 2019-12-23 ENCOUNTER — Inpatient Hospital Stay (HOSPITAL_BASED_OUTPATIENT_CLINIC_OR_DEPARTMENT_OTHER): Payer: PPO | Admitting: Hematology

## 2019-12-23 VITALS — BP 159/77 | HR 67 | Temp 97.2°F | Resp 20 | Wt 169.5 lb

## 2019-12-23 DIAGNOSIS — C3491 Malignant neoplasm of unspecified part of right bronchus or lung: Secondary | ICD-10-CM | POA: Diagnosis not present

## 2019-12-23 DIAGNOSIS — G959 Disease of spinal cord, unspecified: Secondary | ICD-10-CM | POA: Diagnosis not present

## 2019-12-23 DIAGNOSIS — C3411 Malignant neoplasm of upper lobe, right bronchus or lung: Secondary | ICD-10-CM | POA: Diagnosis not present

## 2019-12-23 DIAGNOSIS — R278 Other lack of coordination: Secondary | ICD-10-CM | POA: Diagnosis not present

## 2019-12-23 DIAGNOSIS — G9520 Unspecified cord compression: Secondary | ICD-10-CM | POA: Diagnosis not present

## 2019-12-23 DIAGNOSIS — G9341 Metabolic encephalopathy: Secondary | ICD-10-CM | POA: Diagnosis not present

## 2019-12-23 DIAGNOSIS — M21371 Foot drop, right foot: Secondary | ICD-10-CM | POA: Diagnosis not present

## 2019-12-23 DIAGNOSIS — M4714 Other spondylosis with myelopathy, thoracic region: Secondary | ICD-10-CM | POA: Diagnosis not present

## 2019-12-23 DIAGNOSIS — R269 Unspecified abnormalities of gait and mobility: Secondary | ICD-10-CM | POA: Diagnosis not present

## 2019-12-23 MED ORDER — LEVOTHYROXINE SODIUM 100 MCG PO TABS: 100.0000 ug | ORAL_TABLET | Freq: Every day | ORAL | 2 refills | Status: DC

## 2019-12-23 NOTE — Patient Instructions (Signed)
[place] at [facility] Discharge Instructions  You were seen today by Dr. Delton Coombes. He went over your recent results and scans. You will be scheduled for a CT scan of your chest before your next visit. Dr. Delton Coombes will see you back in 6 months for labs and follow up.   Thank you for choosing [place] at [facility] to provide your oncology and hematology care.  To afford each patient quality time with our provider, please arrive at least 15 minutes before your scheduled appointment time.   If you have a lab appointment with the [facility] please come in thru the Main Entrance and check in at the main information desk  You need to re-schedule your appointment should you arrive 10 or more minutes late.  We strive to give you quality time with our providers, and arriving late affects you and other patients whose appointments are after yours.  Also, if you no show three or more times for appointments you may be dismissed from the clinic at the providers discretion.     Again, thank you for choosing [facility].  Our hope is that these requests will decrease the amount of time that you wait before being seen by our physicians.       _____________________________________________________________  Should you have questions after your visit to [facility], please contact our office at [phone] between the hours of 8:00 a.m. and 4:30 p.m.  Voicemails left after 4:00 p.m. will not be returned until the following business day.  For prescription refill requests, have your pharmacy contact our office and allow 72 hours.    Cancer Center Support Programs:   > Cancer Support Group  2nd Tuesday of the month 1pm-2pm, Journey Room

## 2019-12-23 NOTE — Progress Notes (Signed)
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 Chemoradiation with carboplatin and paclitaxel weekly from 06/11/2017 to 07/16/2017. 2. Consolidation with durvalumab from 09/19/2017 to 09/17/2018.  NGS Results: Not done  CURRENT THERAPY: Observation  BRIEF ONCOLOGIC HISTORY:  Oncology History  Carcinoma, lung ([facility])  06/05/2017 Initial Diagnosis   Carcinoma, lung ([facility])   06/11/2017 - 07/16/2017 Chemotherapy   The patient had dexamethasone (DECADRON) 4 MG tablet, 8 mg, Oral, Daily, 1 of 1 cycle, Start date: --, End date: -- palonosetron (ALOXI) injection 0.25 mg, 0.25 mg, Intravenous,  Once, 6 of 6 cycles Administration: 0.25 mg (06/11/2017), 0.25 mg (07/09/2017), 0.25 mg (07/16/2017), 0.25 mg (06/18/2017), 0.25 mg (06/25/2017), 0.25 mg (07/02/2017) CARBOplatin (PARAPLATIN) 190 mg in sodium chloride 0.9 % 250 mL chemo infusion, 190 mg (100 % of original dose 192.8 mg), Intravenous,  Once, 6 of 6 cycles Dose modification:   (original dose 192.8 mg, Cycle 1),   (original dose 192.8 mg, Cycle 5), 144.6 mg (original dose 144.6 mg, Cycle 6),   (original dose 192.8 mg, Cycle 2),   (original dose 192.8 mg, Cycle 3),   (original dose 192.8 mg, Cycle 4) Administration: 190 mg (06/11/2017), 190 mg (07/09/2017), 140 mg (07/16/2017), 190 mg (06/18/2017), 190 mg (06/25/2017), 190 mg (07/02/2017) PACLitaxel (TAXOL) 90 mg in dextrose 5 % 250 mL chemo infusion (</= 43m/m2), 45 mg/m2 = 90 mg, Intravenous,  Once, 6 of 6 cycles Dose modification: 40.5 mg/m2 (90 % of original dose 45 mg/m2, Cycle 5, Reason: Other (see comments), Comment: early neuropathy), 36 mg/m2 (80 % of original dose 45 mg/m2, Cycle 6, Reason: Provider Judgment) Administration: 90 mg (06/11/2017), 78 mg  (07/09/2017), 72 mg (07/16/2017), 90 mg (06/18/2017), 78 mg (06/25/2017), 78 mg (07/02/2017)  for chemotherapy treatment.    Malignant ne[place] of bronchus and lung ([facility]  06/08/2017 Initial Diagnosis   Malignant ne[place] of bronchus and lung ([facility]   09/20/2017 -  Chemotherapy   The patient had durvalumab (IMFINZI) 860 mg in sodium chloride 0.9 % 100 mL chemo infusion, 800 mg, Intravenous,  Once, 26 of 26 cycles Administration: 860 mg (09/20/2017), 860 mg (10/04/2017), 860 mg (10/18/2017), 860 mg (11/01/2017), 860 mg (11/15/2017), 860 mg (11/29/2017), 860 mg (12/13/2017), 860 mg (12/27/2017), 860 mg (01/10/2018), 860 mg (01/28/2018), 860 mg (02/11/2018), 860 mg (02/25/2018), 860 mg (03/11/2018), 860 mg (03/25/2018), 860 mg (04/08/2018), 860 mg (04/22/2018), 860 mg (05/08/2018), 860 mg (05/27/2018), 860 mg (06/10/2018), 860 mg (06/25/2018), 860 mg (07/09/2018), 860 mg (07/23/2018), 860 mg (08/06/2018), 860 mg (08/20/2018), 860 mg (09/03/2018), 860 mg (09/17/2018)  for chemotherapy treatment.      CANCER STAGING: Cancer Staging No matching staging information was found for the patient.  INTERVAL HISTORY:  Ian Aguilar a 79y.o. male, returns for routine follow-up of his stage II right upper lobe adenocarcinoma. GEthridgewas last seen on 06/23/2019.  Today he is accompanied by his son and reports feeling okay. He was [facility] for hypoxia and disorientation [facility] He is recovering well since discharge. He denies having numbness or tingling. His appetite is excellent.   REVIEW OF SYSTEMS:  Review of Systems  Constitutional: Positive for fatigue (40%). Negative for appetite change.  Respiratory: Positive for shortness of breath.   Neurological:  Negative for numbness.  All other systems reviewed and are negative.   PAST MEDICAL/SURGICAL HISTORY:  Past Medical History:  Diagnosis Date  . Anxiety   . Arthritis   . Cancer ([facility])   . Cirrhosis ([facility])    confirmed by MRI on 07/04/11.    Ian Aguilar COPD  (chronic obstructive pulmonary disease) ([facility])   . Depression with anxiety   . ETOH abuse   . Hyperlipidemia   . Hypertension    diet control  . Nodule of upper lobe of right lung    with mediastinal adenopathy  . Wears dentures   . Wears glasses    Past Surgical History:  Procedure Laterality Date  . bilateral cataract surgery     [place]  . broken arm  6 yrs ago   left otif of wrist-[place]  . CLOSED REDUCTION WRIST FRACTURE Right 09/02/2015   Procedure: RIGHT WRIST REDUCTION;  Surgeon: Renette Butters, MD;  Location: [facility];  Service: Orthopedics;  Laterality: Right;  with MAC  . COLONOSCOPY  1996   Ian Aguilar: external hemorrhoids, no polyps  . COLONOSCOPY  06/28/2011   Ian Aguilar diverticulosis,tubular adenoma, hyperplastic polyp  . COLONOSCOPY WITH PROPOFOL N/A 04/26/2017   Ian Aguilar: perianal and digital rectal examinations normal, diffusely congested colonic mucosa but otherwise normal  . ESOPHAGOGASTRODUODENOSCOPY  06/28/2011   Ian Aguilar erosive reflux esophagitis, hiatal hernia-gastritis  . ESOPHAGOGASTRODUODENOSCOPY (EGD) WITH PROPOFOL N/A 04/26/2017   Ian Aguilar: normal esophagus, small hiatal hernia, GAVE, portal hypertensive gastropathy, normal duodenal bulb and second portion, no specimens collected. 2 years screening   . ESOPHAGOGASTRODUODENOSCOPY (EGD) WITH PROPOFOL N/A 06/19/2019   normal esophagus, portal gastropathy, GAVE, normal duodenum. 2 years screening.   Ian Aguilar EXTERNAL EAR SURGERY     right ear-cleaned out ear and created new eardrum  . INGUINAL HERNIA REPAIR  2-3 yrs ago   left-Ian [facility]ilar-[facility]_  . INTRAMEDULLARY (IM) NAIL INTERTROCHANTERIC Right 09/02/2015   Procedure: RIGHT  INTERTROCHANTRIC HIP;  Surgeon: Renette Butters, MD;  Location: [facility];  Service: Orthopedics;  Laterality: Right;  With MAC  . KNEE SURGERY     left-arthroscopy-[place]  . PORTACATH PLACEMENT Left 06/08/2017   Procedure: INSERTION POWER PORT WITH  ATTACHED CATHETER LEFT SUBCLAVIAN;   Surgeon: Aviva Signs, MD;  Location: [facility];  Service: General;  Laterality: Left;  . SKULL FRACTURE ELEVATION     fractured cheeck bone repaired  . TOTAL HIP ARTHROPLASTY  12/18/2011   Procedure: TOTAL HIP ARTHROPLASTY;  Surgeon: Carole Civil, MD;  Location: [facility];  Service: Orthopedics;  Laterality: Left;  Ian Aguilar VIDEO BRONCHOSCOPY WITH ENDOBRONCHIAL ULTRASOUND N/A 05/04/2017   Procedure: VIDEO BRONCHOSCOPY WITH ENDOBRONCHIAL ULTRASOUND;  Surgeon: Melrose Nakayama, MD;  Location: [facility];  Service: Thoracic;  Laterality: N/A;  . WISDOM TOOTH EXTRACTION      SOCIAL HISTORY:  Social History   Socioeconomic History  . Marital status: Married    Spouse name: Not on file  . Number of children: Not on file  . Years of education: Not on file  . Highest education level: Not on file  Occupational History  . Occupation: retired    Fish farm manager: [employer]  Tobacco Use  . Smoking status: Current Every Day Smoker    Packs/day: 0.10    Years: 62.00    Pack years: 6.20    Types: Cigarettes  . Smokeless tobacco: Never Used  . Tobacco comment: 1/2 ppd > 60 years as of 06/27/17: 4-5 cigarettes or less a day  Vaping Use  . Vaping Use: Never used  Substance and Sexual Activity  . Alcohol use: Not Currently    Comment: stopped 4 years ago  . Drug use: No  . Sexual activity: Yes    Birth control/protection: None  Other Topics Concern  . Not on file  Social History Narrative  . Not on file   Social Determinants of Health   Financial Resource Strain:   . Difficulty of Paying Living Expenses: Not on file  Food Insecurity:   . Worried About Charity fundraiser in the Last Year: Not on file  . Ran Out of Food in the Last Year: Not on file  Transportation Needs:   . Lack of Transportation (Medical): Not on file  . Lack of Transportation (Non-Medical): Not on file  Physical Activity:   . Days of Exercise per Week: Not on file  . Minutes of Exercise per Session: Not on file   Stress:   . Feeling of Stress : Not on file  Social Connections:   . Frequency of Communication with Friends and Family: Not on file  . Frequency of Social Gatherings with Friends and Family: Not on file  . Attends Religious Services: Not on file  . Active Member of Clubs or Organizations: Not on file  . Attends Archivist Meetings: Not on file  . Marital Status: Not on file  Intimate Partner Violence:   . Fear of Current or Ex-Partner: Not on file  . Emotionally Abused: Not on file  . Physically Abused: Not on file  . Sexually Abused: Not on file    FAMILY HISTORY:  Family History  Problem Relation Age of Onset  . Stroke Mother 62  . Heart disease Father 91       MI  . Diabetes Maternal Uncle   . Colon cancer Neg Hx   . Cancer Neg Hx     CURRENT MEDICATIONS:  Current Outpatient Medications  Medication Sig Dispense Refill  . albuterol (VENTOLIN HFA) 108 (90 Base) MCG/ACT inhaler Inhale 2 puffs into the lungs daily.     Ian Aguilar amLODipine (NORVASC) 10 MG tablet Take 1 tablet (10 mg total) by mouth daily. 30 tablet 0  . budesonide-formoterol (SYMBICORT) 160-4.5 MCG/ACT inhaler Inhale 2 puffs into the lungs in the morning and at bedtime. 1 each 0  . cholecalciferol (VITAMIN D) 1000 units tablet Take 1,000 Units by mouth daily.    Ian Aguilar guaiFENesin (MUCINEX) 600 MG 12 hr tablet Take 600 mg by mouth daily.     Ian Aguilar levothyroxine (SYNTHROID) 100 MCG tablet Take 1 tablet (100 mcg total) by mouth daily before breakfast. 30 tablet 2  . rosuvastatin (CRESTOR) 10 MG tablet Take 10 mg by mouth daily.    Ian Aguilar SPIRIVA HANDIHALER 18 MCG inhalation capsule Place 18 mcg into inhaler and inhale daily.   10  . meclizine (ANTIVERT) 25 MG tablet Take 25 mg by mouth as needed for dizziness.  (Patient not taking: Reported on 12/23/2019)  0  . Propylene Glycol-Glycerin (SOOTHE) 0.6-0.6 % SOLN Place 1-2 drops into both eyes as needed (for dry/irritated eyes.).  (Patient not taking: Reported on 12/23/2019)      No current facility-administered medications for this visit.    ALLERGIES:  No Known Allergies  PHYSICAL EXAM:  Performance status (ECOG): 1 - Symptomatic but completely ambulatory  Vitals:   12/23/19 1417  BP: (!) 159/77  Pulse: 67  Resp: 20  Temp: (!) 97.2 F (36.2 C)  SpO2: 92%  Wt Readings from Last 3 Encounters:  12/23/19 169 lb 8 oz (76.9 kg)  10/22/19 167 lb 15.9 oz (76.2 kg)  10/07/19 176 lb 12.8 oz (80.2 kg)   Physical Exam Vitals reviewed.  Constitutional:      Appearance: Normal appearance.  Cardiovascular:     Rate and Rhythm: Normal rate and regular rhythm.     Pulses: Normal pulses.     Heart sounds: Normal heart sounds.  Pulmonary:     Effort: Pulmonary effort is normal.     Breath sounds: Normal breath sounds.  Musculoskeletal:     Right lower leg: No edema.     Left lower leg: No edema.  Neurological:     General: No focal deficit present.     Mental Status: He is alert and oriented to person, place, and time.  Psychiatric:        Mood and Affect: Mood normal.        Behavior: Behavior normal.      LABORATORY DATA:  I have reviewed the labs as listed.  CBC Latest Ref Rng & Units 12/16/2019 10/25/2019 10/24/2019  WBC 4.0 - 10.5 K/uL 5.1 12.8(H) 8.1  Hemoglobin 13.0 - 17.0 g/dL 12.8(L) 13.7 13.0  Hematocrit 39 - 52 % 40.2 41.9 40.4  Platelets 150 - 400 K/uL 222 216 181   CMP Latest Ref Rng & Units 12/16/2019 10/25/2019 10/24/2019  Glucose 70 - 99 mg/dL 95 111(H) 147(H)  BUN 8 - 23 mg/dL 10 24(H) 25(H)  Creatinine 0.61 - 1.24 mg/dL 0.89 0.71 0.84  Sodium 135 - 145 mmol/L 141 133(L) 136  Potassium 3.5 - 5.1 mmol/L 4.0 3.5 3.7  Chloride 98 - 111 mmol/L 99 96(L) 99  CO2 22 - 32 mmol/L _0 Calcium 8.9 - 10.3 mg/dL 9.4 8.6(L) 8.9  Total Protein 6.5 - 8.1 g/dL 7.2 - 6.4(L)  Total Bilirubin 0.3 - 1.2 mg/dL 1.1 - 1.0  Alkaline Phos 38 - 126 U/L 86 - 55  AST 15 - 41 U/L 21 - 53(H)  ALT 0 - 44 U/L 14 - 27    DIAGNOSTIC IMAGING:  I  have independently reviewed the scans and discussed with the patient. CT Chest W Contrast  Result Date: 12/17/2019 CLINICAL DATA:  Follow-up RIGHT upper lobe adenocarcinoma diagnosed in 2019 EXAM: CT CHEST WITH CONTRAST TECHNIQUE: Multidetector CT imaging of the chest was performed during intravenous contrast administration. CONTRAST:  66m OMNIPAQUE IOHEXOL 300 MG/ML  SOLN COMPARISON:  June 15, 2017 FINDINGS: Cardiovascular: Calcified and noncalcified atheromatous plaque of the thoracic aorta. Heart size is normal without pericardial effusion. Cardiac structures are unchanged since the prior exam. Central pulmonary vasculature unremarkable on venous phase assessment. Mediastinum/Nodes: Esophagus grossly normal. LEFT-sided central venous access device, Port-A-Cath in stable position in the distal portion of the superior vena cava. No adenopathy in the chest. Lungs/Pleura: Nodule amidst radiation changes in the RIGHT medial chest in the upper lobe 9 x 8 mm, unchanged compared to the prior study. Bandlike subpleural reticulation in the RIGHT chest on image 82 of series 5 is new compared to the previous exam. This extends along approximately 6 cm of the subpleural lung and is without discrete nodule rather is composed of septal thickening and micro nodularity. Similar appearing area along the anterior LEFT chest in the LEFT upper lobe spanning images 55-68. Background pulmonary emphysema as before. Signs of lingular scarring. Upper Abdomen: Signs of cirrhosis. Stable area of low attenuation of the LEFT hepatic lobe and periportal low  attenuation dating back to January 2020. Liver is incompletely imaged. Imaged portions of the pancreas and spleen are normal. Adrenal glands are normal. Imaged portions the pancreas are unremarkable. Musculoskeletal: No acute musculoskeletal process. Spinal degenerative changes. Unchanged appearance of wedging of the L1 vertebral body. IMPRESSION: 1. Stable radiation changes and small  nodule in the medial RIGHT upper lobe. No signs indicative of disease recurrence. 2. Bandlike areas of septal thickening and micro nodularity in the RIGHT lateral and LEFT anterior and upper chest, most suggestive of sequela of infection or inflammation. Consider short interval follow-up to ensure resolution. 3. Signs of hepatic cirrhosis. 4. Emphysema and aortic atherosclerosis. Aortic Atherosclerosis (ICD10-I70.0) and Emphysema (ICD10-J43.9). Electronically Signed   By: Zetta Bills M.D.   On: 12/17/2019 12:50     ASSESSMENT:  1.  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 -Durvalumab consolidation from 09/19/2017 through 09/17/2018. -Denies any cough or hemoptysis. -I reviewed CT chest with contrast from 06/16/2019 which showed 9 mm nodule in the medial right upper lobe unchanged.  Adjacent radiation changes.  No evidence of progression or metastatic disease. -CT chest on 12/16/2019 showed stable radiation changes and small nodule in the medial right upper lobe with no signs of recurrence.  New bandlike area of septal thickening and micro nodularity in the right lateral and left anterior upper chest, most suggestive of sequela of infection/inflammation.  Signs of hepatic cirrhosis.   PLAN:  1.  Stage II (T1BN2) right upper lobe adenocarcinoma: -He was reportedly admitted from 10/22/2019 through 0/10/2328 with metabolic and gastropathy and COPD exacerbation. -He is feeling much better at this time. -I have reviewed CT scan images and results with the patient and his son. -I have also reviewed results from 12/16/2018 one of the blood work.  Hemoglobin is 12.8 and rest of the blood work was normal. -Recommend follow-up in 6 months with repeat CT scan of the chest.  2.  Neuropathy: -No neuropathic pains.  On and off numbness in the extremities.  3.  Hypothyroidism: -Continue Synthroid 100 mcg  daily.    Orders placed this encounter:  No orders of the defined types were placed in this encounter.    Derek Jack, MD [place] [phone]   I, Milinda Antis, am acting as a scribe for Dr. Sanda Linger.  I, Derek Jack MD, have reviewed the above documentation for accuracy and completeness, and I agree with the above.

## 2019-12-27 DIAGNOSIS — M169 Osteoarthritis of hip, unspecified: Secondary | ICD-10-CM | POA: Diagnosis not present

## 2019-12-27 DIAGNOSIS — E039 Hypothyroidism, unspecified: Secondary | ICD-10-CM | POA: Diagnosis not present

## 2019-12-27 DIAGNOSIS — J449 Chronic obstructive pulmonary disease, unspecified: Secondary | ICD-10-CM | POA: Diagnosis not present

## 2019-12-29 ENCOUNTER — Encounter (HOSPITAL_COMMUNITY): Payer: Self-pay

## 2019-12-29 ENCOUNTER — Inpatient Hospital Stay (HOSPITAL_COMMUNITY): Payer: PPO | Attending: Hematology

## 2019-12-29 ENCOUNTER — Other Ambulatory Visit: Payer: Self-pay

## 2019-12-29 DIAGNOSIS — Z452 Encounter for adjustment and management of vascular access device: Secondary | ICD-10-CM | POA: Diagnosis not present

## 2019-12-29 DIAGNOSIS — C3411 Malignant neoplasm of upper lobe, right bronchus or lung: Secondary | ICD-10-CM | POA: Insufficient documentation

## 2019-12-29 MED ORDER — SODIUM CHLORIDE 0.9% FLUSH
10.0000 mL | INTRAVENOUS | Status: DC | PRN
  Administered 2019-12-29: 10 mL via INTRAVENOUS

## 2019-12-29 MED ORDER — HEPARIN SOD (PORK) LOCK FLUSH 100 UNIT/ML IV SOLN
500.0000 [IU] | Freq: Once | INTRAVENOUS | Status: AC
  Administered 2019-12-29: 500 [IU] via INTRAVENOUS

## 2019-12-29 NOTE — Progress Notes (Signed)
Ian Aguilar tolerated portacath flush well without complaints or incident. VSS Port accessed with 20 gauge needle with good blood return noted then flushed easily per protocol and de-accessed. Pt discharged via wheelchair in satisfactory condition

## 2019-12-29 NOTE — Patient Instructions (Signed)
[facility] Discharge Instructions  Portacath flushed per protocol today. Follow-up as scheduled   Thank you for choosing [facility] to provide your oncology and hematology care.  To afford each patient quality time with our provider, please arrive at least 15 minutes before your scheduled appointment time.   If you have a lab appointment with the Cancer Center please come in thru the Main Entrance and check in at the main information desk.  You need to re-schedule your appointment should you arrive 10 or more minutes late.  We strive to give you quality time with our providers, and arriving late affects you and other patients whose appointments are after yours.  Also, if you no show three or more times for appointments you may be dismissed from the clinic at the providers discretion.     Again, thank you for choosing [facility].  Our hope is that these requests will decrease the amount of time that you wait before being seen by our physicians.       _____________________________________________________________  Should you have questions after your visit to [facility], please contact our office at [phone] and follow the prompts.  Our office hours are 8:00 a.m. and 4:30 p.m. Monday - Friday.  Please note that voicemails left after 4:00 p.m. may not be returned until the following business day.  We are closed weekends and major holidays.  You do have access to a nurse 24-7, just call the main number to the clinic [phone] and do not press any options, hold on the line and a nurse will answer the phone.    For prescription refill requests, have your pharmacy contact our office and allow 72 hours.    Due to Covid, you will need to wear a mask upon entering the hospital. If you do not have a mask, a mask will be given to you at the Main Entrance upon arrival. For doctor visits, patients may  have 1 support person age 18 or older with them. For treatment visits, patients can not have anyone with them due to social distancing guidelines and our immunocompromised population.     

## 2020-01-02 ENCOUNTER — Ambulatory Visit (HOSPITAL_COMMUNITY)
Admission: RE | Admit: 2020-01-02 | Discharge: 2020-01-02 | Disposition: A | Discharge status: Home / Self Care | Payer: PPO | Source: Ambulatory Visit | Attending: Neurology | Admitting: Neurology

## 2020-01-02 ENCOUNTER — Other Ambulatory Visit: Payer: Self-pay

## 2020-01-02 DIAGNOSIS — G9520 Unspecified cord compression: Secondary | ICD-10-CM | POA: Insufficient documentation

## 2020-01-02 DIAGNOSIS — M4714 Other spondylosis with myelopathy, thoracic region: Secondary | ICD-10-CM | POA: Insufficient documentation

## 2020-01-02 DIAGNOSIS — G959 Disease of spinal cord, unspecified: Secondary | ICD-10-CM | POA: Insufficient documentation

## 2020-01-02 DIAGNOSIS — M4802 Spinal stenosis, cervical region: Secondary | ICD-10-CM | POA: Diagnosis not present

## 2020-01-02 DIAGNOSIS — M47814 Spondylosis without myelopathy or radiculopathy, thoracic region: Secondary | ICD-10-CM | POA: Diagnosis not present

## 2020-01-02 MED ORDER — GADOBUTROL 1 MMOL/ML IV SOLN
7.0000 mL | Freq: Once | INTRAVENOUS | Status: AC | PRN
  Administered 2020-01-02: 7 mL via INTRAVENOUS

## 2020-01-07 DIAGNOSIS — R2689 Other abnormalities of gait and mobility: Secondary | ICD-10-CM | POA: Diagnosis not present

## 2020-01-07 DIAGNOSIS — J449 Chronic obstructive pulmonary disease, unspecified: Secondary | ICD-10-CM | POA: Diagnosis not present

## 2020-01-07 DIAGNOSIS — D0221 Carcinoma in situ of right bronchus and lung: Secondary | ICD-10-CM | POA: Diagnosis not present

## 2020-01-07 DIAGNOSIS — M50022 Cervical disc disorder at C5-C6 level with myelopathy: Secondary | ICD-10-CM | POA: Diagnosis not present

## 2020-01-07 DIAGNOSIS — M21371 Foot drop, right foot: Secondary | ICD-10-CM | POA: Diagnosis not present

## 2020-01-12 ENCOUNTER — Encounter (HOSPITAL_COMMUNITY): Admission: RE | Admit: 2020-01-12 | Payer: PPO | Source: Ambulatory Visit

## 2020-01-13 ENCOUNTER — Encounter (HOSPITAL_COMMUNITY): Payer: Self-pay

## 2020-01-13 ENCOUNTER — Other Ambulatory Visit: Payer: Self-pay

## 2020-01-13 ENCOUNTER — Encounter (HOSPITAL_COMMUNITY)
Admission: RE | Admit: 2020-01-13 | Discharge: 2020-01-13 | Disposition: A | Discharge status: Home / Self Care | Payer: PPO | Source: Ambulatory Visit | Attending: Neurology | Admitting: Neurology

## 2020-01-13 ENCOUNTER — Encounter (HOSPITAL_COMMUNITY): Payer: PPO

## 2020-01-13 DIAGNOSIS — G0491 Myelitis, unspecified: Secondary | ICD-10-CM | POA: Insufficient documentation

## 2020-01-13 MED ORDER — SODIUM CHLORIDE 0.9 % IV SOLN
1000.0000 mg | Freq: Once | INTRAVENOUS | Status: AC
  Administered 2020-01-13: 1000 mg via INTRAVENOUS
  Filled 2020-01-13: qty 8

## 2020-01-13 MED ORDER — SODIUM CHLORIDE 0.9 % IV SOLN: Freq: Once | INTRAVENOUS | Status: AC

## 2020-01-14 ENCOUNTER — Encounter (HOSPITAL_COMMUNITY): Payer: Self-pay

## 2020-01-14 ENCOUNTER — Encounter (HOSPITAL_COMMUNITY)
Admission: RE | Admit: 2020-01-14 | Discharge: 2020-01-14 | Disposition: A | Discharge status: Home / Self Care | Payer: PPO | Source: Ambulatory Visit | Attending: Neurology | Admitting: Neurology

## 2020-01-14 DIAGNOSIS — G0491 Myelitis, unspecified: Secondary | ICD-10-CM | POA: Diagnosis not present

## 2020-01-14 MED ORDER — SODIUM CHLORIDE 0.9 % IV SOLN: Freq: Once | INTRAVENOUS | Status: AC

## 2020-01-14 MED ORDER — SODIUM CHLORIDE 0.9 % IV SOLN
1000.0000 mg | Freq: Once | INTRAVENOUS | Status: AC
  Administered 2020-01-14: 1000 mg via INTRAVENOUS
  Filled 2020-01-14: qty 8

## 2020-01-27 DIAGNOSIS — J449 Chronic obstructive pulmonary disease, unspecified: Secondary | ICD-10-CM | POA: Diagnosis not present

## 2020-01-27 DIAGNOSIS — M169 Osteoarthritis of hip, unspecified: Secondary | ICD-10-CM | POA: Diagnosis not present

## 2020-01-27 DIAGNOSIS — E039 Hypothyroidism, unspecified: Secondary | ICD-10-CM | POA: Diagnosis not present

## 2020-02-01 DIAGNOSIS — H40033 Anatomical narrow angle, bilateral: Secondary | ICD-10-CM | POA: Diagnosis not present

## 2020-02-01 DIAGNOSIS — H04123 Dry eye syndrome of bilateral lacrimal glands: Secondary | ICD-10-CM | POA: Diagnosis not present

## 2020-02-06 DIAGNOSIS — J449 Chronic obstructive pulmonary disease, unspecified: Secondary | ICD-10-CM | POA: Diagnosis not present

## 2020-03-08 DIAGNOSIS — J449 Chronic obstructive pulmonary disease, unspecified: Secondary | ICD-10-CM | POA: Diagnosis not present

## 2020-03-27 DIAGNOSIS — J449 Chronic obstructive pulmonary disease, unspecified: Secondary | ICD-10-CM | POA: Diagnosis not present

## 2020-03-27 DIAGNOSIS — M169 Osteoarthritis of hip, unspecified: Secondary | ICD-10-CM | POA: Diagnosis not present

## 2020-03-27 DIAGNOSIS — E039 Hypothyroidism, unspecified: Secondary | ICD-10-CM | POA: Diagnosis not present

## 2020-03-30 ENCOUNTER — Inpatient Hospital Stay (HOSPITAL_COMMUNITY): Payer: PPO | Attending: Hematology

## 2020-04-01 ENCOUNTER — Telehealth: Payer: Self-pay | Admitting: Internal Medicine

## 2020-04-01 NOTE — Telephone Encounter (Signed)
Letter mailed

## 2020-04-01 NOTE — Telephone Encounter (Signed)
Recall for mri pancreas

## 2020-04-06 ENCOUNTER — Ambulatory Visit (INDEPENDENT_AMBULATORY_CARE_PROVIDER_SITE_OTHER): Payer: PPO | Admitting: Gastroenterology

## 2020-04-06 ENCOUNTER — Encounter: Payer: Self-pay | Admitting: Internal Medicine

## 2020-04-06 ENCOUNTER — Other Ambulatory Visit: Payer: Self-pay

## 2020-04-06 ENCOUNTER — Encounter: Payer: Self-pay | Admitting: Gastroenterology

## 2020-04-06 VITALS — BP 150/75 | HR 72 | Temp 96.6°F | Ht 71.0 in | Wt 190.0 lb

## 2020-04-06 DIAGNOSIS — K7460 Unspecified cirrhosis of liver: Secondary | ICD-10-CM

## 2020-04-06 NOTE — Progress Notes (Signed)
Referring Provider: Sharilyn Sites, MD Primary Care Physician:  Sharilyn Sites, MD Primary GI: Dr. Gala Romney   Chief Complaint  Patient presents with  . Follow-up    F/U cirrhosis, no complaints    HPI:   Ian Aguilar is a 80 y.o. 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 He has had multiple CTs but only one MRI in past. On CT, the cystic lesion has remained stable for approximately 7 years. Needs surveillance MRI April 2022 and would recommend every 2 years MRI. Completed Hepatitis vaccinations. Next EGD for surveillance due in 2023.   Chronic heaviness in lower legs. From knees down feels heavy. Balance up and down because of it. No pain in legs. Just tingling and numbness. Bottom of feet hard to feel the floor. No lower extremity edema. No chest pain. Sleeps with oxygen. No abdominal pain. Good appetite. No dysphagia. No GERD. No mental status changes or confusion.   Has been without thyroid medication for 6 weeks. Wants to hold off on MRI right now. Willing to do RUQ [place].   Past Medical History:  Diagnosis Date  . Anxiety   . Arthritis   . Cancer ([place])   . Cirrhosis ([place])    confirmed by MRI on 07/04/11.    Marland Kitchen COPD (chronic obstructive pulmonary disease) ([place])   . Depression with anxiety   . ETOH abuse   . Hyperlipidemia   . Hypertension    diet control  . Nodule of upper lobe of right lung    with mediastinal adenopathy  . Wears dentures   . Wears glasses     Past Surgical History:  Procedure Laterality Date  . bilateral cataract surgery     [place]  . broken arm  6 yrs ago   left otif of wrist-[place]  . CLOSED REDUCTION WRIST FRACTURE Right 09/02/2015   Procedure: RIGHT WRIST REDUCTION;  Surgeon: Renette Butters, MD;  Location: [facility];  Service: Orthopedics;  Laterality: Right;  with MAC  . COLONOSCOPY   1996   [place]: external hemorrhoids, no polyps  . COLONOSCOPY  06/28/2011   Dr. Ala Bent diverticulosis,tubular adenoma, hyperplastic polyp  . COLONOSCOPY WITH PROPOFOL N/A 04/26/2017   Dr. Gala Romney: perianal and digital rectal examinations normal, diffusely congested colonic mucosa but otherwise normal  . ESOPHAGOGASTRODUODENOSCOPY  06/28/2011   Dr. Chelsea Aus erosive reflux esophagitis, hiatal hernia-gastritis  . ESOPHAGOGASTRODUODENOSCOPY (EGD) WITH PROPOFOL N/A 04/26/2017   Dr. Gala Romney: normal esophagus, small hiatal hernia, GAVE, portal hypertensive gastropathy, normal duodenal bulb and second portion, no specimens collected. 2 years screening   . ESOPHAGOGASTRODUODENOSCOPY (EGD) WITH PROPOFOL N/A 06/19/2019   normal esophagus, portal gastropathy, GAVE, normal duodenum. 2 years screening.   Marland Kitchen EXTERNAL EAR SURGERY     right ear-cleaned out ear and created new eardrum  . INGUINAL HERNIA REPAIR  2-3 yrs ago   left-[place]_  . INTRAMEDULLARY (IM) NAIL INTERTROCHANTERIC Right 09/02/2015   Procedure: RIGHT  INTERTROCHANTRIC HIP;  Surgeon: Renette Butters, MD;  Location: [facility];  Service: Orthopedics;  Laterality: Right;  With MAC  . KNEE SURGERY     left-arthroscopy-[place]  . PORTACATH PLACEMENT Left 06/08/2017   Procedure: INSERTION POWER PORT WITH  ATTACHED CATHETER LEFT SUBCLAVIAN;  Surgeon: Aviva Signs, MD;  Location: [facility];  Service: General;  Laterality: Left;  . SKULL FRACTURE ELEVATION  fractured cheeck bone repaired  . TOTAL HIP ARTHROPLASTY  12/18/2011   Procedure: TOTAL HIP ARTHROPLASTY;  Surgeon: Carole Civil, MD;  Location: [facility];  Service: Orthopedics;  Laterality: Left;  Marland Kitchen VIDEO BRONCHOSCOPY WITH ENDOBRONCHIAL ULTRASOUND N/A 05/04/2017   Procedure: VIDEO BRONCHOSCOPY WITH ENDOBRONCHIAL ULTRASOUND;  Surgeon: Melrose Nakayama, MD;  Location[facility];  Service: Thoracic;  Laterality: N/A;  . WISDOM TOOTH EXTRACTION      Current Outpatient Medications  Medication  Sig Dispense Refill  . cholecalciferol (VITAMIN D) 1000 units tablet Take 1,000 Units by mouth daily.    Marland Kitchen guaiFENesin (MUCINEX) 600 MG 12 hr tablet Take 600 mg by mouth as needed (congestion).    . meclizine (ANTIVERT) 25 MG tablet Take 25 mg by mouth as needed for dizziness.  0  . Propylene Glycol-Glycerin 0.6-0.6 % SOLN Place 1-2 drops into both eyes as needed (for dry/irritated eyes.).    Marland Kitchen SPIRIVA HANDIHALER 18 MCG inhalation capsule Place 18 mcg into inhaler and inhale daily.   10  . levothyroxine (SYNTHROID) 100 MCG tablet Take 1 tablet (100 mcg total) by mouth daily before breakfast. (Patient not taking: Reported on 04/06/2020) 30 tablet 2  . rosuvastatin (CRESTOR) 10 MG tablet Take 10 mg by mouth daily. (Patient not taking: Reported on 04/06/2020)     No current facility-administered medications for this visit.    Allergies as of 04/06/2020  . (No Known Allergies)    Family History  Problem Relation Age of [place]  . Stroke Mother 35  . Heart disease Father 79       MI  . Diabetes Maternal Uncle   . Colon cancer Neg Hx   . Cancer Neg Hx     Social History   Socioeconomic History  . Marital status: Married    Spouse name: Not on file  . Number of children: Not on file  . Years of education: Not on file  . Highest education level: Not on file  Occupational History  . Occupation: retired    Fish farm manager: [employer]  Tobacco Use  . Smoking status: Former Smoker    Packs/day: 0.00    Years: 62.00    Pack years: 0.00    Types: Cigarettes    Quit date: 02/28/2019    Years since quitting: 1.1  . Smokeless tobacco: Never Used  . Tobacco comment: 1/2 ppd > 60 years as of 06/27/17: 4-5 cigarettes or less a day  Vaping Use  . Vaping Use: Never used  Substance and Sexual Activity  . Alcohol use: Not Currently    Comment: stopped 4 years ago  . Drug use: No  . Sexual activity: Yes    Birth control/protection: None  Other Topics Concern  . Not on file  Social  History Narrative  . Not on file   Social Determinants of Health   Financial Resource Strain: Not on file  Food Insecurity: Not on file  Transportation Needs: Not on file  Physical Activity: Not on file  Stress: Not on file  Social Connections: Not on file    Review of Systems: Gen: Denies fever, chills, anorexia. Denies fatigue, weakness, weight loss.  CV: Denies chest pain, palpitations, syncope, peripheral edema, and claudication. Resp: Denies dyspnea at rest, cough, wheezing, coughing up blood, and pleurisy. GI: see HPI Derm: Denies rash, itching, dry skin Psych: Denies depression, anxiety, memory loss, confusion. No homicidal or suicidal ideation.  Heme: Denies bruising, bleeding, and enlarged lymph nodes.  Physical Exam:  BP (!) 150/75   Pulse 72   Temp (!) 96.6 F (35.9 C) (Temporal)   Ht _0  (1.803 m)   Wt 190 lb (86.2 kg)   BMI 26.50 kg/m  General:   Alert and oriented. No distress noted. Pleasant and cooperative.  Head:  Normocephalic and atraumatic. Eyes:  Conjuctiva clear without scleral icterus. Mouth:  Mask in place Abdomen:  Limited exam with patient sitting in chair. Unable to get on exam table. +BS, distended but soft, small umbilical hernia Extremities:  Without edema. Neurologic:  Alert and  oriented x4 Psych:  Alert and cooperative. Normal mood and affect.  ASSESSMENT: Ian Aguilar is a 80 y.o. male presenting today with history of cirrhosis presumably related to ETOH use, followed by Oncology due to history of right upper lobe lung cancer. Pancreatic cysts historically felt to be benign with multiple images (CTs) remaining stable for past 7 years. Due for surveillance MRI.   From a cirrhosis standpoint, he is stable. I have encouraged him to reach out to PCP regarding Synthroid refills and reported heaviness and "tingles" in lower legs that seem most consistent with neuropathy.  Will proceed with RUQ [place] for routine cirrhosis care. He would like  to hold off on MRI pancreas currently. I have asked that INR be done at time of labs in April 2022 that will be done with Oncology at [facility]. Will have staff fax this there.   Surveillance EGD due in 2023.    PLAN:  RUQ [place] INR with upcoming labs in April 2022 6 month follow-up EGD in 2023   Annitta Needs, PhD, ANP-BC [facility]

## 2020-04-06 NOTE — Patient Instructions (Signed)
We are arranging an ultrasound in the near future for routine purposes.  We will fax the blood work to be added to the blood work already awaiting you for April 2022.   Please call Dr. Hilma Favors about your thyroid medication!  We will see you in 6 months!  I enjoyed seeing you again today! As you know, I value our relationship and want to provide genuine, compassionate, and quality care. I welcome your feedback. If you receive a survey regarding your visit,  I greatly appreciate you taking time to fill this out. See you next time!  Annitta Needs, PhD, ANP-BC [facility]

## 2020-04-08 ENCOUNTER — Other Ambulatory Visit: Payer: Self-pay

## 2020-04-08 ENCOUNTER — Telehealth: Payer: Self-pay | Admitting: Internal Medicine

## 2020-04-08 DIAGNOSIS — J449 Chronic obstructive pulmonary disease, unspecified: Secondary | ICD-10-CM | POA: Diagnosis not present

## 2020-04-08 DIAGNOSIS — K862 Cyst of pancreas: Secondary | ICD-10-CM

## 2020-04-08 NOTE — Telephone Encounter (Signed)
[phone] patient son called to schedule his fathers mri. He will be unavailable from 1-2 if he could please be called after

## 2020-04-08 NOTE — Telephone Encounter (Signed)
Called pt's son, pt is agreeable to do MRI pancreas that is due in April.  Spoke to [facility] radiology, she advised to order MRI abdomen w/wo contrast.   MRI abd w/wo scheduled for 06/17/20 at 2:00pm, arrive at 1:30pm. NPO 4 hours prior to test.  Called and informed son of MRI appt. Letter mailed.

## 2020-04-09 ENCOUNTER — Telehealth: Payer: Self-pay | Admitting: Gastroenterology

## 2020-04-09 NOTE — Telephone Encounter (Signed)
[facility] clinical pool, if patient is doing MRI in April, we can hold off on February [place]. He had told me he wasn't ready to do MRI, but it appears that has changed.

## 2020-04-09 NOTE — Telephone Encounter (Signed)
Called pt and spoke with spouse. She is aware [place] cancelled for next week since he is having MRI done in April. She will inform patient and [place] has been cancelled.

## 2020-04-14 ENCOUNTER — Ambulatory Visit (HOSPITAL_COMMUNITY): Payer: PPO

## 2020-04-19 ENCOUNTER — Other Ambulatory Visit: Payer: Self-pay

## 2020-04-19 ENCOUNTER — Other Ambulatory Visit (HOSPITAL_COMMUNITY): Payer: Self-pay | Admitting: Physician Assistant

## 2020-04-19 ENCOUNTER — Ambulatory Visit (HOSPITAL_COMMUNITY)
Admission: RE | Admit: 2020-04-19 | Discharge: 2020-04-19 | Disposition: A | Discharge status: Home / Self Care | Payer: PPO | Source: Ambulatory Visit | Attending: Physician Assistant | Admitting: Physician Assistant

## 2020-04-19 DIAGNOSIS — Z823 Family history of stroke: Secondary | ICD-10-CM | POA: Diagnosis not present

## 2020-04-19 DIAGNOSIS — R270 Ataxia, unspecified: Secondary | ICD-10-CM | POA: Diagnosis not present

## 2020-04-19 DIAGNOSIS — Z79899 Other long term (current) drug therapy: Secondary | ICD-10-CM | POA: Diagnosis not present

## 2020-04-19 DIAGNOSIS — K5900 Constipation, unspecified: Secondary | ICD-10-CM | POA: Diagnosis not present

## 2020-04-19 DIAGNOSIS — M79641 Pain in right hand: Secondary | ICD-10-CM | POA: Diagnosis not present

## 2020-04-19 DIAGNOSIS — Z8249 Family history of ischemic heart disease and other diseases of the circulatory system: Secondary | ICD-10-CM | POA: Diagnosis not present

## 2020-04-19 DIAGNOSIS — Z66 Do not resuscitate: Secondary | ICD-10-CM | POA: Diagnosis not present

## 2020-04-19 DIAGNOSIS — G9340 Encephalopathy, unspecified: Secondary | ICD-10-CM | POA: Diagnosis not present

## 2020-04-19 DIAGNOSIS — R188 Other ascites: Secondary | ICD-10-CM | POA: Diagnosis not present

## 2020-04-19 DIAGNOSIS — C3490 Malignant neoplasm of unspecified part of unspecified bronchus or lung: Secondary | ICD-10-CM | POA: Diagnosis not present

## 2020-04-19 DIAGNOSIS — R531 Weakness: Secondary | ICD-10-CM | POA: Diagnosis not present

## 2020-04-19 DIAGNOSIS — R932 Abnormal findings on diagnostic imaging of liver and biliary tract: Secondary | ICD-10-CM | POA: Diagnosis not present

## 2020-04-19 DIAGNOSIS — J9811 Atelectasis: Secondary | ICD-10-CM | POA: Diagnosis not present

## 2020-04-19 DIAGNOSIS — J449 Chronic obstructive pulmonary disease, unspecified: Secondary | ICD-10-CM | POA: Diagnosis not present

## 2020-04-19 DIAGNOSIS — Z79890 Hormone replacement therapy (postmenopausal): Secondary | ICD-10-CM | POA: Diagnosis not present

## 2020-04-19 DIAGNOSIS — Z6828 Body mass index (BMI) 28.0-28.9, adult: Secondary | ICD-10-CM | POA: Diagnosis not present

## 2020-04-19 DIAGNOSIS — R41841 Cognitive communication deficit: Secondary | ICD-10-CM | POA: Diagnosis not present

## 2020-04-19 DIAGNOSIS — J9611 Chronic respiratory failure with hypoxia: Secondary | ICD-10-CM | POA: Diagnosis not present

## 2020-04-19 DIAGNOSIS — W1830XA Fall on same level, unspecified, initial encounter: Secondary | ICD-10-CM | POA: Diagnosis present

## 2020-04-19 DIAGNOSIS — Z978 Presence of other specified devices: Secondary | ICD-10-CM | POA: Diagnosis not present

## 2020-04-19 DIAGNOSIS — Z96642 Presence of left artificial hip joint: Secondary | ICD-10-CM | POA: Diagnosis not present

## 2020-04-19 DIAGNOSIS — R5381 Other malaise: Secondary | ICD-10-CM | POA: Diagnosis not present

## 2020-04-19 DIAGNOSIS — S62306A Unspecified fracture of fifth metacarpal bone, right hand, initial encounter for closed fracture: Secondary | ICD-10-CM | POA: Diagnosis not present

## 2020-04-19 DIAGNOSIS — N179 Acute kidney failure, unspecified: Secondary | ICD-10-CM | POA: Diagnosis not present

## 2020-04-19 DIAGNOSIS — K7689 Other specified diseases of liver: Secondary | ICD-10-CM | POA: Diagnosis not present

## 2020-04-19 DIAGNOSIS — F17210 Nicotine dependence, cigarettes, uncomplicated: Secondary | ICD-10-CM | POA: Diagnosis not present

## 2020-04-19 DIAGNOSIS — Z0000 Encounter for general adult medical examination without abnormal findings: Secondary | ICD-10-CM | POA: Diagnosis not present

## 2020-04-19 DIAGNOSIS — Z20822 Contact with and (suspected) exposure to covid-19: Secondary | ICD-10-CM | POA: Diagnosis not present

## 2020-04-19 DIAGNOSIS — K7460 Unspecified cirrhosis of liver: Secondary | ICD-10-CM | POA: Diagnosis not present

## 2020-04-19 DIAGNOSIS — C3411 Malignant neoplasm of upper lobe, right bronchus or lung: Secondary | ICD-10-CM | POA: Diagnosis not present

## 2020-04-19 DIAGNOSIS — R140 Abdominal distension (gaseous): Secondary | ICD-10-CM | POA: Diagnosis not present

## 2020-04-19 DIAGNOSIS — Y92012 Bathroom of single-family (private) house as the place of occurrence of the external cause: Secondary | ICD-10-CM | POA: Diagnosis not present

## 2020-04-19 DIAGNOSIS — R110 Nausea: Secondary | ICD-10-CM | POA: Diagnosis not present

## 2020-04-19 DIAGNOSIS — K7030 Alcoholic cirrhosis of liver without ascites: Secondary | ICD-10-CM | POA: Diagnosis not present

## 2020-04-19 DIAGNOSIS — R262 Difficulty in walking, not elsewhere classified: Secondary | ICD-10-CM | POA: Diagnosis not present

## 2020-04-19 DIAGNOSIS — K7031 Alcoholic cirrhosis of liver with ascites: Secondary | ICD-10-CM | POA: Diagnosis not present

## 2020-04-19 DIAGNOSIS — E785 Hyperlipidemia, unspecified: Secondary | ICD-10-CM | POA: Diagnosis not present

## 2020-04-19 DIAGNOSIS — R109 Unspecified abdominal pain: Secondary | ICD-10-CM | POA: Diagnosis not present

## 2020-04-19 DIAGNOSIS — K862 Cyst of pancreas: Secondary | ICD-10-CM | POA: Diagnosis not present

## 2020-04-19 DIAGNOSIS — Z833 Family history of diabetes mellitus: Secondary | ICD-10-CM | POA: Diagnosis not present

## 2020-04-19 DIAGNOSIS — E039 Hypothyroidism, unspecified: Secondary | ICD-10-CM | POA: Diagnosis not present

## 2020-04-19 DIAGNOSIS — I10 Essential (primary) hypertension: Secondary | ICD-10-CM | POA: Diagnosis not present

## 2020-04-19 DIAGNOSIS — M6281 Muscle weakness (generalized): Secondary | ICD-10-CM | POA: Diagnosis not present

## 2020-04-19 DIAGNOSIS — E038 Other specified hypothyroidism: Secondary | ICD-10-CM | POA: Diagnosis not present

## 2020-04-19 DIAGNOSIS — Z1331 Encounter for screening for depression: Secondary | ICD-10-CM | POA: Diagnosis not present

## 2020-04-19 DIAGNOSIS — E871 Hypo-osmolality and hyponatremia: Secondary | ICD-10-CM | POA: Diagnosis not present

## 2020-04-19 DIAGNOSIS — K838 Other specified diseases of biliary tract: Secondary | ICD-10-CM | POA: Diagnosis not present

## 2020-04-19 DIAGNOSIS — Z720 Tobacco use: Secondary | ICD-10-CM | POA: Diagnosis not present

## 2020-04-19 DIAGNOSIS — Z9181 History of falling: Secondary | ICD-10-CM | POA: Diagnosis not present

## 2020-04-20 ENCOUNTER — Emergency Department (HOSPITAL_COMMUNITY): Payer: PPO

## 2020-04-20 ENCOUNTER — Encounter (HOSPITAL_COMMUNITY): Payer: Self-pay | Admitting: Emergency Medicine

## 2020-04-20 ENCOUNTER — Inpatient Hospital Stay (HOSPITAL_COMMUNITY)
Admission: EM | Admit: 2020-04-20 | Discharge: 2020-04-23 | DRG: 644 | Disposition: A | Discharge status: Skilled Nursing Facility | Payer: PPO | Attending: Internal Medicine | Admitting: Internal Medicine

## 2020-04-20 ENCOUNTER — Other Ambulatory Visit: Payer: Self-pay

## 2020-04-20 ENCOUNTER — Telehealth: Payer: Self-pay | Admitting: Internal Medicine

## 2020-04-20 DIAGNOSIS — K5900 Constipation, unspecified: Secondary | ICD-10-CM | POA: Diagnosis present

## 2020-04-20 DIAGNOSIS — Z79899 Other long term (current) drug therapy: Secondary | ICD-10-CM | POA: Diagnosis not present

## 2020-04-20 DIAGNOSIS — Z96642 Presence of left artificial hip joint: Secondary | ICD-10-CM | POA: Diagnosis present

## 2020-04-20 DIAGNOSIS — R110 Nausea: Secondary | ICD-10-CM | POA: Diagnosis present

## 2020-04-20 DIAGNOSIS — J9611 Chronic respiratory failure with hypoxia: Secondary | ICD-10-CM | POA: Diagnosis present

## 2020-04-20 DIAGNOSIS — K838 Other specified diseases of biliary tract: Secondary | ICD-10-CM | POA: Diagnosis present

## 2020-04-20 DIAGNOSIS — E871 Hypo-osmolality and hyponatremia: Secondary | ICD-10-CM | POA: Diagnosis present

## 2020-04-20 DIAGNOSIS — E039 Hypothyroidism, unspecified: Secondary | ICD-10-CM | POA: Diagnosis not present

## 2020-04-20 DIAGNOSIS — Z823 Family history of stroke: Secondary | ICD-10-CM

## 2020-04-20 DIAGNOSIS — K862 Cyst of pancreas: Secondary | ICD-10-CM

## 2020-04-20 DIAGNOSIS — K7460 Unspecified cirrhosis of liver: Secondary | ICD-10-CM | POA: Diagnosis present

## 2020-04-20 DIAGNOSIS — G9340 Encephalopathy, unspecified: Secondary | ICD-10-CM | POA: Diagnosis present

## 2020-04-20 DIAGNOSIS — Y92012 Bathroom of single-family (private) house as the place of occurrence of the external cause: Secondary | ICD-10-CM

## 2020-04-20 DIAGNOSIS — Z66 Do not resuscitate: Secondary | ICD-10-CM | POA: Diagnosis present

## 2020-04-20 DIAGNOSIS — W1830XA Fall on same level, unspecified, initial encounter: Secondary | ICD-10-CM | POA: Diagnosis present

## 2020-04-20 DIAGNOSIS — Z79890 Hormone replacement therapy (postmenopausal): Secondary | ICD-10-CM | POA: Diagnosis not present

## 2020-04-20 DIAGNOSIS — R140 Abdominal distension (gaseous): Secondary | ICD-10-CM | POA: Diagnosis present

## 2020-04-20 DIAGNOSIS — C3411 Malignant neoplasm of upper lobe, right bronchus or lung: Secondary | ICD-10-CM | POA: Diagnosis present

## 2020-04-20 DIAGNOSIS — K7031 Alcoholic cirrhosis of liver with ascites: Secondary | ICD-10-CM

## 2020-04-20 DIAGNOSIS — Z8249 Family history of ischemic heart disease and other diseases of the circulatory system: Secondary | ICD-10-CM

## 2020-04-20 DIAGNOSIS — F17210 Nicotine dependence, cigarettes, uncomplicated: Secondary | ICD-10-CM | POA: Diagnosis present

## 2020-04-20 DIAGNOSIS — Z833 Family history of diabetes mellitus: Secondary | ICD-10-CM

## 2020-04-20 DIAGNOSIS — S62306A Unspecified fracture of fifth metacarpal bone, right hand, initial encounter for closed fracture: Secondary | ICD-10-CM | POA: Diagnosis present

## 2020-04-20 DIAGNOSIS — N179 Acute kidney failure, unspecified: Secondary | ICD-10-CM | POA: Diagnosis not present

## 2020-04-20 DIAGNOSIS — R270 Ataxia, unspecified: Secondary | ICD-10-CM | POA: Diagnosis not present

## 2020-04-20 DIAGNOSIS — J449 Chronic obstructive pulmonary disease, unspecified: Secondary | ICD-10-CM | POA: Diagnosis present

## 2020-04-20 DIAGNOSIS — Z20822 Contact with and (suspected) exposure to covid-19: Secondary | ICD-10-CM | POA: Diagnosis present

## 2020-04-20 DIAGNOSIS — C3490 Malignant neoplasm of unspecified part of unspecified bronchus or lung: Secondary | ICD-10-CM | POA: Diagnosis present

## 2020-04-20 DIAGNOSIS — I10 Essential (primary) hypertension: Secondary | ICD-10-CM | POA: Diagnosis present

## 2020-04-20 DIAGNOSIS — R059 Cough, unspecified: Secondary | ICD-10-CM

## 2020-04-20 DIAGNOSIS — E785 Hyperlipidemia, unspecified: Secondary | ICD-10-CM | POA: Diagnosis present

## 2020-04-20 DIAGNOSIS — E038 Other specified hypothyroidism: Secondary | ICD-10-CM | POA: Diagnosis not present

## 2020-04-20 LAB — CBC WITH DIFFERENTIAL/PLATELET
Abs Immature Granulocytes: 0.01 10*3/uL (ref 0.00–0.07)
Basophils Absolute: 0 10*3/uL (ref 0.0–0.1)
Basophils Relative: 1 %
Eosinophils Absolute: 0.1 10*3/uL (ref 0.0–0.5)
Eosinophils Relative: 4 %
HCT: 34.4 % — ABNORMAL LOW (ref 39.0–52.0)
Hemoglobin: 11.5 g/dL — ABNORMAL LOW (ref 13.0–17.0)
Immature Granulocytes: 0 %
Lymphocytes Relative: 23 %
Lymphs Abs: 0.8 10*3/uL (ref 0.7–4.0)
MCH: 31.7 pg (ref 26.0–34.0)
MCHC: 33.4 g/dL (ref 30.0–36.0)
MCV: 94.8 fL (ref 80.0–100.0)
Monocytes Absolute: 0.3 10*3/uL (ref 0.1–1.0)
Monocytes Relative: 7 %
Neutro Abs: 2.3 10*3/uL (ref 1.7–7.7)
Neutrophils Relative %: 65 %
Platelets: 163 10*3/uL (ref 150–400)
RBC: 3.63 MIL/uL — ABNORMAL LOW (ref 4.22–5.81)
RDW: 17.5 % — ABNORMAL HIGH (ref 11.5–15.5)
WBC: 3.6 10*3/uL — ABNORMAL LOW (ref 4.0–10.5)
nRBC: 0 % (ref 0.0–0.2)

## 2020-04-20 LAB — COMPREHENSIVE METABOLIC PANEL
ALT: 57 U/L — ABNORMAL HIGH (ref 0–44)
AST: 146 U/L — ABNORMAL HIGH (ref 15–41)
Albumin: 3.9 g/dL (ref 3.5–5.0)
Alkaline Phosphatase: 90 U/L (ref 38–126)
Anion gap: 6 (ref 5–15)
BUN: 15 mg/dL (ref 8–23)
CO2: 30 mmol/L (ref 22–32)
Calcium: 8.9 mg/dL (ref 8.9–10.3)
Chloride: 94 mmol/L — ABNORMAL LOW (ref 98–111)
Creatinine, Ser: 1.4 mg/dL — ABNORMAL HIGH (ref 0.61–1.24)
GFR, Estimated: 51 mL/min — ABNORMAL LOW (ref >60)
Glucose, Bld: 89 mg/dL (ref 70–99)
Potassium: 4.1 mmol/L (ref 3.5–5.1)
Sodium: 130 mmol/L — ABNORMAL LOW (ref 135–145)
Total Bilirubin: 0.9 mg/dL (ref 0.3–1.2)
Total Protein: 6.9 g/dL (ref 6.5–8.1)

## 2020-04-20 LAB — PROTIME-INR
INR: 1.1 (ref 0.8–1.2)
Prothrombin Time: 13.4 seconds (ref 11.4–15.2)

## 2020-04-20 LAB — RESP PANEL BY RT-PCR (FLU A&B, COVID) ARPGX2
Influenza A by PCR: NEGATIVE
Influenza B by PCR: NEGATIVE
SARS Coronavirus 2 by RT PCR: NEGATIVE

## 2020-04-20 LAB — LIPASE, BLOOD: Lipase: 30 U/L (ref 11–51)

## 2020-04-20 LAB — AMMONIA: Ammonia: 25 umol/L (ref 9–35)

## 2020-04-20 MED ORDER — UMECLIDINIUM BROMIDE 62.5 MCG/INH IN AEPB
1.0000 | INHALATION_SPRAY | Freq: Every day | RESPIRATORY_TRACT | Status: DC
  Administered 2020-04-21 – 2020-04-23 (×3): 1 via RESPIRATORY_TRACT
  Filled 2020-04-20: qty 7

## 2020-04-20 MED ORDER — OXYCODONE HCL 5 MG PO TABS: 5.0000 mg | ORAL_TABLET | ORAL | Status: DC | PRN

## 2020-04-20 MED ORDER — TIOTROPIUM BROMIDE MONOHYDRATE 18 MCG IN CAPS: 18.0000 ug | ORAL_CAPSULE | Freq: Every day | RESPIRATORY_TRACT | Status: DC

## 2020-04-20 MED ORDER — ONDANSETRON HCL 4 MG/2ML IJ SOLN: 4.0000 mg | Freq: Four times a day (QID) | INTRAMUSCULAR | Status: DC | PRN

## 2020-04-20 MED ORDER — ACETAMINOPHEN 650 MG RE SUPP: 650.0000 mg | Freq: Four times a day (QID) | RECTAL | Status: DC | PRN

## 2020-04-20 MED ORDER — ONDANSETRON HCL 4 MG PO TABS: 4.0000 mg | ORAL_TABLET | Freq: Four times a day (QID) | ORAL | Status: DC | PRN

## 2020-04-20 MED ORDER — SODIUM CHLORIDE 0.9 % IV BOLUS (SEPSIS)
250.0000 mL | Freq: Once | INTRAVENOUS | Status: AC
  Administered 2020-04-20: 250 mL via INTRAVENOUS

## 2020-04-20 MED ORDER — IOHEXOL 300 MG/ML  SOLN
100.0000 mL | Freq: Once | INTRAMUSCULAR | Status: AC | PRN
  Administered 2020-04-20: 100 mL via INTRAVENOUS

## 2020-04-20 MED ORDER — ROSUVASTATIN CALCIUM 10 MG PO TABS
10.0000 mg | ORAL_TABLET | Freq: Every day | ORAL | Status: DC
Start: 2020-04-21 — End: 2020-04-21

## 2020-04-20 MED ORDER — LEVOTHYROXINE SODIUM 100 MCG PO TABS
100.0000 ug | ORAL_TABLET | Freq: Every day | ORAL | Status: DC
  Administered 2020-04-21: 100 ug via ORAL
  Filled 2020-04-20: qty 2

## 2020-04-20 MED ORDER — ACETAMINOPHEN 325 MG PO TABS: 650.0000 mg | ORAL_TABLET | Freq: Four times a day (QID) | ORAL | Status: DC | PRN

## 2020-04-20 MED ORDER — HEPARIN SODIUM (PORCINE) 5000 UNIT/ML IJ SOLN
5000.0000 [IU] | Freq: Three times a day (TID) | INTRAMUSCULAR | Status: DC
  Administered 2020-04-21 – 2020-04-23 (×9): 5000 [IU] via SUBCUTANEOUS
  Filled 2020-04-20 (×10): qty 1

## 2020-04-20 MED ORDER — GUAIFENESIN ER 600 MG PO TB12: 600.0000 mg | ORAL_TABLET | ORAL | Status: DC | PRN

## 2020-04-20 MED ORDER — LEVOTHYROXINE SODIUM 50 MCG PO TABS: 100.0000 ug | ORAL_TABLET | Freq: Every day | ORAL | Status: DC

## 2020-04-20 NOTE — Telephone Encounter (Signed)
PATIENT WIFE CALLED AND SAID THAT THEY RESCHEDULED HIS MRI TO A SOONER APPOIINTMENT AND THE PERSON AT THE [facility] TOLD HER TO CALL HERE AND MAKE SURE THE AUTHORIZATION IS OK FOR THE SOONER APPOINTMENT

## 2020-04-20 NOTE — ED Notes (Signed)
Left AC PIV total take down and new Tegaderm applied. Flushed x2.

## 2020-04-20 NOTE — ED Provider Notes (Signed)
[facility] EMERGENCY DEPARTMENT Provider Note   CSN: [id number] Arrival date & time: 04/20/20  1517     History Chief Complaint  Patient presents with  . Nausea    Ian Aguilar is a 80 y.o. male.  Patient with history of cirrhosis.  He complains of distended abdomen with nausea for a number days.  The history is provided by the patient and medical records. No language interpreter was used.  Abdominal Pain Pain location:  Generalized Pain quality: aching   Pain radiates to:  Does not radiate Pain severity:  Mild Onset quality:  Sudden Timing:  Constant Progression:  Waxing and waning Chronicity:  New Context: not awakening from sleep   Relieved by:  Nothing Associated symptoms: no chest pain, no cough, no diarrhea, no fatigue and no hematuria        Past Medical History:  Diagnosis Date  . Anxiety   . Arthritis   . Cancer ([place])   . Cirrhosis ([place])    confirmed by MRI on 07/04/11.    Marland Kitchen COPD (chronic obstructive pulmonary disease) ([place])   . Depression with anxiety   . ETOH abuse   . Hyperlipidemia   . Hypertension    diet control  . Nodule of upper lobe of right lung    with mediastinal adenopathy  . Wears dentures   . Wears glasses     Patient Active Problem List   Diagnosis Date Noted  . COPD exacerbation ([place]) 10/24/2019  . Acute encephalopathy 10/23/2019  . Dehydration 10/23/2019  . COPD with acute exacerbation ([place]) 10/23/2019  . Chronic hypoxemic respiratory failure ([place]) 10/23/2019  . Hypertensive urgency 10/23/2019  . Goals of care, counseling/discussion   . Palliative care by specialist   . DNR (do not resuscitate) discussion   . Malignant neoplasm of bronchus and lung ([place])   . Carcinoma, lung ([place]) 06/05/2017  . Hepatic cirrhosis ([place]) 04/06/2017  . History of colonic polyps 04/06/2017  . History of snoring 09/07/2015  . Fracture of ulnar styloid   . Benign essential HTN   . Faintness   . Thrombocytopenia ([place]) 09/03/2015  .  Hyponatremia 09/03/2015  . Closed displaced intertrochanteric fracture of right femur ([place])   . Distal radius fracture, right 09/02/2015  . Fracture of right hip requiring operative repair ([place]) 09/01/2015  . Alcohol abuse 09/01/2015  . Tobacco abuse 09/01/2015  . COPD (chronic obstructive pulmonary disease) 09/01/2015  . 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 Weakness of left leg 01/10/2012  . Difficulty in walking(719.7) 01/10/2012  . Pain in left hip 01/10/2012  . Acute blood loss anemia 12/21/2011  . Cirrhosis- imaging diagnosis 2013 09/08/2011  . Umbilical hernia 93/57/0177  . 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  . Hepatomegaly 06/14/2011  . Encounter for screening colonoscopy 06/14/2011  . JOINT EFFUSION, KNEE 02/10/2008  . KNEE PAIN 02/10/2008    Past Surgical History:  Procedure Laterality Date  . bilateral cataract surgery     [place]  . broken arm  6 yrs ago   left otif of wrist-[place]  . CLOSED REDUCTION WRIST FRACTURE Right 09/02/2015   Procedure: RIGHT WRIST REDUCTION;  Surgeon: Renette Butters, MD;  Location: [place];  Service: Orthopedics;  Laterality: Right;  with MAC  . COLONOSCOPY  1996   [place]: external hemorrhoids, no polyps  . COLONOSCOPY  06/28/2011   Dr. Ala Bent diverticulosis,tubular adenoma, hyperplastic polyp  . COLONOSCOPY WITH PROPOFOL N/A 04/26/2017   Dr. Gala Romney: perianal and digital rectal examinations normal, diffusely congested  colonic mucosa but otherwise normal  . ESOPHAGOGASTRODUODENOSCOPY  06/28/2011   Dr. Chelsea Aus erosive reflux esophagitis, hiatal hernia-gastritis  . ESOPHAGOGASTRODUODENOSCOPY (EGD) WITH PROPOFOL N/A 04/26/2017   Dr. Gala Romney: normal esophagus, small hiatal hernia, GAVE, portal hypertensive gastropathy, normal duodenal bulb and second portion, no specimens collected. 2 years screening   . ESOPHAGOGASTRODUODENOSCOPY (EGD) WITH PROPOFOL N/A 06/19/2019   normal esophagus, portal gastropathy, GAVE, normal duodenum. 2 years  screening.   Marland Kitchen EXTERNAL EAR SURGERY     right ear-cleaned out ear and created new eardrum  . INGUINAL HERNIA REPAIR  2-3 yrs ago   left-[place]_  . INTRAMEDULLARY (IM) NAIL INTERTROCHANTERIC Right 09/02/2015   Procedure: RIGHT  INTERTROCHANTRIC HIP;  Surgeon: Renette Butters, MD;  Location: [place];  Service: Orthopedics;  Laterality: Right;  With MAC  . KNEE SURGERY     left-arthroscopy-[place]  . PORTACATH PLACEMENT Left 06/08/2017   Procedure: INSERTION POWER PORT WITH  ATTACHED CATHETER LEFT SUBCLAVIAN;  Surgeon: Aviva Signs, MD;  Location: [facility];  Service: General;  Laterality: Left;  . SKULL FRACTURE ELEVATION     fractured cheeck bone repaired  . TOTAL HIP ARTHROPLASTY  12/18/2011   Procedure: TOTAL HIP ARTHROPLASTY;  Surgeon: Carole Civil, MD;  Location: [facility];  Service: Orthopedics;  Laterality: Left;  Marland Kitchen VIDEO BRONCHOSCOPY WITH ENDOBRONCHIAL ULTRASOUND N/A 05/04/2017   Procedure: VIDEO BRONCHOSCOPY WITH ENDOBRONCHIAL ULTRASOUND;  Surgeon: Melrose Nakayama, MD;  Location[facility];  Service: Thoracic;  Laterality: N/A;  . WISDOM TOOTH EXTRACTION         Family History  Problem Relation Age of Onset  . Stroke Mother 26  . Heart disease Father 64       MI  . Diabetes Maternal Uncle   . Colon cancer Neg Hx   . Cancer Neg Hx     Social History   Tobacco Use  . Smoking status: Former Smoker    Packs/day: 0.00    Years: 62.00    Pack years: 0.00    Types: Cigarettes    Quit date: 02/28/2019    Years since quitting: 1.1  . Smokeless tobacco: Never Used  . Tobacco comment: 1/2 ppd > 60 years as of 06/27/17: 4-5 cigarettes or less a day  Vaping Use  . Vaping Use: Never used  Substance Use Topics  . Alcohol use: Not Currently    Comment: stopped 4 years ago  . Drug use: No    Home Medications Prior to Admission medications   Medication Sig Start Date End Date Taking? Authorizing Provider  cholecalciferol (VITAMIN D) 1000 units tablet Take 1,000 Units by  mouth daily.    [provider]  guaiFENesin (MUCINEX) 600 MG 12 hr tablet Take 600 mg by mouth as needed (congestion).    [provider]  levothyroxine (SYNTHROID) 100 MCG tablet Take 1 tablet (100 mcg total) by mouth daily before breakfast. Patient not taking: Reported on 04/06/2020 12/23/19   Derek Jack, MD  meclizine (ANTIVERT) 25 MG tablet Take 25 mg by mouth as needed for dizziness. 12/07/17   [provider]  Propylene Glycol-Glycerin 0.6-0.6 % SOLN Place 1-2 drops into both eyes as needed (for dry/irritated eyes.).    [provider]  rosuvastatin (CRESTOR) 10 MG tablet Take 10 mg by mouth daily. Patient not taking: Reported on 04/06/2020 06/13/19   [provider]  SPIRIVA HANDIHALER 18 MCG inhalation capsule Place 18 mcg into inhaler and inhale daily.  10/18/17   [provider]  Allergies    Patient has no known allergies.  Review of Systems   Review of Systems  Constitutional: Negative for appetite change and fatigue.  HENT: Negative for congestion, ear discharge and sinus pressure.   Eyes: Negative for discharge.  Respiratory: Negative for cough.   Cardiovascular: Negative for chest pain.  Gastrointestinal: Positive for abdominal pain. Negative for diarrhea.  Genitourinary: Negative for frequency and hematuria.  Musculoskeletal: Negative for back pain.  Skin: Negative for rash.  Neurological: Negative for seizures and headaches.  Psychiatric/Behavioral: Negative for hallucinations.    Physical Exam Updated Vital Signs BP (!) 177/87   Pulse (!) 57   Temp 98.7 F (37.1 C) (Oral)   Resp 13   Ht _0  (1.803 m)   Wt 88.5 kg   SpO2 95%   BMI 27.20 kg/m   Physical Exam Vitals and nursing note reviewed.  Constitutional:      Appearance: He is well-developed.  HENT:     Head: Normocephalic.     Nose: Nose normal.  Eyes:     General: No scleral icterus.    Extraocular Movements: EOM normal.      Conjunctiva/sclera: Conjunctivae normal.  Neck:     Thyroid: No thyromegaly.  Cardiovascular:     Rate and Rhythm: Normal rate and regular rhythm.     Heart sounds: No murmur heard. No friction rub. No gallop.   Pulmonary:     Breath sounds: No stridor. No wheezing or rales.  Chest:     Chest wall: No tenderness.  Abdominal:     General: There is distension.     Tenderness: There is abdominal tenderness. There is no rebound.  Musculoskeletal:        General: No edema. Normal range of motion.     Cervical back: Neck supple.  Lymphadenopathy:     Cervical: No cervical adenopathy.  Skin:    Findings: No erythema or rash.  Neurological:     Mental Status: He is alert and oriented to person, place, and time.     Motor: No abnormal muscle tone.     Coordination: Coordination normal.  Psychiatric:        Mood and Affect: Mood and affect normal.        Behavior: Behavior normal.     ED Results / Procedures / Treatments   Labs (all labs ordered are listed, but only abnormal results are displayed) Labs Reviewed  CBC WITH DIFFERENTIAL/PLATELET - Abnormal; Notable for the following components:      Result Value   WBC 3.6 (*)    RBC 3.63 (*)    Hemoglobin 11.5 (*)    HCT 34.4 (*)    RDW 17.5 (*)    All other components within normal limits  COMPREHENSIVE METABOLIC PANEL - Abnormal; Notable for the following components:   Sodium 130 (*)    Chloride 94 (*)    Creatinine, Ser 1.40 (*)    AST 146 (*)    ALT 57 (*)    GFR, Estimated 51 (*)    All other components within normal limits  RESP PANEL BY RT-PCR (FLU A&B, COVID) ARPGX2  AMMONIA  PROTIME-INR  LIPASE, BLOOD    EKG None  Radiology US Abdomen Complete  Result Date: 04/20/2020 CLINICAL DATA:  Cirrhosis and abdominal distension. EXAM: ABDOMEN ULTRASOUND COMPLETE COMPARISON:  None. FINDINGS: Gallbladder: No gallstones or wall thickening visualized (2.4 mm). No sonographic Murphy sign noted by sonographer. Common bile  duct: Diameter: 3.7 mm Liver: The  echotexture of the liver is coarse and nodular in appearance. No focal lesion identified. Diffusely increased echogenicity of the liver parenchyma is seen. Portal vein is patent on color Doppler imaging with normal direction of blood flow towards the liver. IVC: No abnormality visualized. Pancreas: Visualized portion unremarkable. Spleen: Size (7.8 cm) and appearance within normal limits. Right Kidney: Length: 10.2 cm. Echogenicity within normal limits. No mass or hydronephrosis visualized. Left Kidney: Length: 10.6 cm. Echogenicity within normal limits. No mass or hydronephrosis visualized. Abdominal aorta: No aneurysm visualized (1.8 cm in AP diameter). Other findings: A mild amount of free fluid is seen within the right lower quadrant. IMPRESSION: 1. Cirrhotic liver. 2. Small amount of free fluid. Electronically Signed   By: Virgina Norfolk M.D.   On: 04/20/2020 19:16   CT ABDOMEN PELVIS W CONTRAST  Result Date: 04/20/2020 CLINICAL DATA:  Abdominal pain and bloating with nausea for 4 days. EXAM: CT ABDOMEN AND PELVIS WITH CONTRAST TECHNIQUE: Multidetector CT imaging of the abdomen and pelvis was performed using the standard protocol following bolus administration of intravenous contrast. CONTRAST:  145m OMNIPAQUE IOHEXOL 300 MG/ML  SOLN COMPARISON:  Multiple prior studies most recent from October of 2021 FINDINGS: Lower chest: Minimal basilar atelectasis. Hepatobiliary: Mild peripheral biliary duct distension in the LEFT hepatic lobe lateral segment. Periportal hypoattenuation unchanged dating back to October of 2019. No focal, suspicious hepatic lesion on venous phase assessment. However, on delayed phase imaging there is suggestion of added density within the central portion of this area raising the question of indolent biliary neoplasm. (Image 96 of series 7) perhaps measuring as large as 1 x 0.8 cm hepatic veins are patent. Gallbladder is nondistended. Common bile duct  is nondilated. Pancreas: Pancreas with signs of mild atrophy. Hypoattenuating lesion in the genu of the pancreas measures 8 mm unchanged since 2019 and not associated with main duct distension or inflammation. Spleen: Spleen normal size with small amount of perisplenic ascites. Adrenals/Urinary Tract: Adrenal glands are normal. Symmetric renal enhancement. No hydronephrosis. Smooth contour the urinary bladder. Stomach/Bowel: Mild gastric thickening is suggested versus under distension with little change. No small bowel dilation. No significant perienteric stranding. Mild generalized mesenteric edema in the setting of portal hypertension now with ascites. Appendix not clearly visualized, perhaps partially visualized along the RIGHT flank. No specific secondary signs of acute appendicitis Vascular/Lymphatic: Calcified and noncalcified atheromatous plaque in the abdominal aorta. There is no gastrohepatic or hepatoduodenal ligament lymphadenopathy. No retroperitoneal or mesenteric lymphadenopathy. Smooth contour the IVC. No pelvic sidewall lymphadenopathy. Streak artifact from LEFT hip arthroplasty limits assessment. Changes of RIGHT hip ORIF also limiting assessment. Reproductive: Prostate unremarkable by CT. Other: Small volume ascites is new compared to previous imaging. No free air. Small fat containing umbilical hernia. Musculoskeletal: Spinal degenerative changes. No acute or destructive bone process. Post RIGHT femoral ORIF. Post LEFT total hip arthroplasty. IMPRESSION: 1. Cirrhosis with signs of ascites, new from prior imaging. 2. Question of gastric thickening may represent gastritis or portal gastropathy. 3. Peribiliary hypoattenuation perhaps a combination of biliary cysts and biliary duct distension. Question of lesion within the biliary ducts in hepatic subsegment III where there is variable attenuation within the ducts best seen on image 36 of series 5. Stability argues against aggressive process but  indolent biliary neoplasm or small stones could have this appearance. When the patient is clinically stable and able to follow directions and hold their breath (preferably as an outpatient) further evaluation with dedicated abdominal MRI/MRCP should be considered. 4. Hypoattenuating lesion  in the genu of the pancreas measures 8 mm and unchanged since 2019 and not associated with main duct distension or inflammation. 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 Consider follow-up in 1 year with MRI. 5. Aortic atherosclerosis. Aortic Atherosclerosis (ICD10-I70.0). Electronically Signed   By: Zetta Bills M.D.   On: 04/20/2020 21:24   DG Hand Complete Right  Result Date: 04/20/2020 CLINICAL DATA:  Right hand pain. Fall last night going into closet. Pain, swelling and bruising about the fifth metacarpal. EXAM: RIGHT HAND - COMPLETE 3+ VIEW COMPARISON:  None. FINDINGS: Minimally displaced and angulated distal fifth metacarpal fracture. There is no intra-articular extension. No other fracture of the hand. The bones are diffusely under mineralized. There is mild osteoarthritis most prominently involving the thumb carpal metacarpal. Remote healed distal radius fracture. Soft tissue edema about the ulnar aspect of the hand. IMPRESSION: Minimally displaced and angulated distal fifth metacarpal fracture. No intra-articular extension. Electronically Signed   By: Keith Rake M.D.   On: 04/20/2020 15:45    Procedures Procedures   Medications Ordered in ED Medications  sodium chloride 0.9 % bolus 250 mL (0 mLs Intravenous Stopped 04/20/20 1730)  iohexol (OMNIPAQUE) 300 MG/ML solution 100 mL (100 mLs Intravenous Contrast Given 04/20/20 2000)   CRITICAL CARE Performed by: Milton Ferguson Total critical care time: 42mnutes Critical care time was exclusive of separately billable procedures and treating other patients. Critical care was necessary to treat or prevent imminent or  life-threatening deterioration. Critical care was time spent personally by me on the following activities: development of treatment plan with patient and/or surrogate as well as nursing, discussions with consultants, evaluation of patient's response to treatment, examination of patient, obtaining history from patient or surrogate, ordering and performing treatments and interventions, ordering and review of laboratory studies, ordering and review of radiographic studies, pulse oximetry and re-evaluation of patient's condition.  ED Course  I have reviewed the triage vital signs and the nursing notes.  Pertinent labs & imaging results that were available during my care of the patient were reviewed by me and considered in my medical decision making (see chart for details). Ultrasound cirrhosis and ascites.  CT scan shows questionable lesion within the biliary ducts.  I spoke with GI and they will consult tomorrow and prefer medicine to admit tonight   MDM Rules/Calculators/A&P                          Distended abdomen with mild discomfort.  Cirrhosis and questionable biliary lesion.  Patient being admitted to medicine with GI consult Final Clinical Impression(s) / ED Diagnoses Final diagnoses:  Alcoholic cirrhosis of liver with ascites ([facility]    Rx / DC Orders ED Discharge Orders    None       ZMilton Ferguson MD 04/21/20 1524

## 2020-04-20 NOTE — ED Triage Notes (Signed)
Pt to the ED via [facility] with complaints of nausea for the past 4-5 days. Pt has no c/o pain but does have a distended abdomen upon inspection.  The pt's wife reported to EMS that he has been weak at home and she is unable to care for him in this state. EMS reports the pt is able to stand and pivot only.

## 2020-04-20 NOTE — Telephone Encounter (Signed)
No P.A is required:

## 2020-04-20 NOTE — ED Notes (Signed)
Son updated that admission is pending. He will be visiting tomorrow between 1300-1330.

## 2020-04-21 ENCOUNTER — Encounter (HOSPITAL_COMMUNITY): Payer: Self-pay | Admitting: Family Medicine

## 2020-04-21 ENCOUNTER — Inpatient Hospital Stay (HOSPITAL_COMMUNITY): Payer: PPO

## 2020-04-21 DIAGNOSIS — K838 Other specified diseases of biliary tract: Secondary | ICD-10-CM | POA: Diagnosis present

## 2020-04-21 DIAGNOSIS — R140 Abdominal distension (gaseous): Secondary | ICD-10-CM

## 2020-04-21 DIAGNOSIS — N179 Acute kidney failure, unspecified: Secondary | ICD-10-CM

## 2020-04-21 DIAGNOSIS — E039 Hypothyroidism, unspecified: Secondary | ICD-10-CM | POA: Diagnosis present

## 2020-04-21 LAB — CBC
HCT: 35.4 % — ABNORMAL LOW (ref 39.0–52.0)
Hemoglobin: 11.4 g/dL — ABNORMAL LOW (ref 13.0–17.0)
MCH: 30.6 pg (ref 26.0–34.0)
MCHC: 32.2 g/dL (ref 30.0–36.0)
MCV: 95.2 fL (ref 80.0–100.0)
Platelets: 155 10*3/uL (ref 150–400)
RBC: 3.72 MIL/uL — ABNORMAL LOW (ref 4.22–5.81)
RDW: 17.3 % — ABNORMAL HIGH (ref 11.5–15.5)
WBC: 4.3 10*3/uL (ref 4.0–10.5)
nRBC: 0 % (ref 0.0–0.2)

## 2020-04-21 LAB — HEPATITIS PANEL, ACUTE
HCV Ab: NONREACTIVE
Hep A IgM: NONREACTIVE
Hep B C IgM: NONREACTIVE
Hepatitis B Surface Ag: NONREACTIVE

## 2020-04-21 LAB — COMPREHENSIVE METABOLIC PANEL
ALT: 55 U/L — ABNORMAL HIGH (ref 0–44)
AST: 142 U/L — ABNORMAL HIGH (ref 15–41)
Albumin: 3.9 g/dL (ref 3.5–5.0)
Alkaline Phosphatase: 90 U/L (ref 38–126)
Anion gap: 11 (ref 5–15)
BUN: 14 mg/dL (ref 8–23)
CO2: 31 mmol/L (ref 22–32)
Calcium: 9.2 mg/dL (ref 8.9–10.3)
Chloride: 92 mmol/L — ABNORMAL LOW (ref 98–111)
Creatinine, Ser: 1.34 mg/dL — ABNORMAL HIGH (ref 0.61–1.24)
GFR, Estimated: 54 mL/min — ABNORMAL LOW (ref >60)
Glucose, Bld: 88 mg/dL (ref 70–99)
Potassium: 3.9 mmol/L (ref 3.5–5.1)
Sodium: 134 mmol/L — ABNORMAL LOW (ref 135–145)
Total Bilirubin: 1.2 mg/dL (ref 0.3–1.2)
Total Protein: 6.8 g/dL (ref 6.5–8.1)

## 2020-04-21 LAB — TSH
TSH: 290.685 u[IU]/mL — ABNORMAL HIGH (ref 0.350–4.500)
TSH: 238.13 u[IU]/mL — ABNORMAL HIGH (ref 0.350–4.500)

## 2020-04-21 MED ORDER — LEVOTHYROXINE SODIUM 75 MCG PO TABS: 150.0000 ug | ORAL_TABLET | Freq: Every day | ORAL | Status: DC

## 2020-04-21 MED ORDER — LEVOTHYROXINE SODIUM 100 MCG/5ML IV SOLN
50.0000 ug | Freq: Every day | INTRAVENOUS | Status: DC
  Administered 2020-04-21 – 2020-04-22 (×2): 50 ug via INTRAVENOUS
  Filled 2020-04-21 (×2): qty 5

## 2020-04-21 MED ORDER — SIMETHICONE 80 MG PO CHEW
80.0000 mg | CHEWABLE_TABLET | Freq: Four times a day (QID) | ORAL | Status: AC
  Administered 2020-04-21 – 2020-04-22 (×3): 80 mg via ORAL
  Filled 2020-04-21 (×4): qty 1

## 2020-04-21 MED ORDER — LACTULOSE 10 GM/15ML PO SOLN
20.0000 g | Freq: Two times a day (BID) | ORAL | Status: DC
  Administered 2020-04-21: 20 g via ORAL
  Filled 2020-04-21: qty 30

## 2020-04-21 MED ORDER — DEXAMETHASONE SODIUM PHOSPHATE 10 MG/ML IJ SOLN
8.0000 mg | Freq: Once | INTRAMUSCULAR | Status: AC
  Administered 2020-04-21: 8 mg via INTRAVENOUS
  Filled 2020-04-21: qty 1

## 2020-04-21 MED ORDER — LACTATED RINGERS IV SOLN: INTRAVENOUS | Status: DC

## 2020-04-21 MED ORDER — GADOBUTROL 1 MMOL/ML IV SOLN
9.0000 mL | Freq: Once | INTRAVENOUS | Status: AC | PRN
  Administered 2020-04-21: 9 mL via INTRAVENOUS

## 2020-04-21 MED ORDER — SENNOSIDES-DOCUSATE SODIUM 8.6-50 MG PO TABS
1.0000 | ORAL_TABLET | Freq: Two times a day (BID) | ORAL | Status: DC
  Administered 2020-04-21 – 2020-04-23 (×5): 1 via ORAL
  Filled 2020-04-21 (×6): qty 1

## 2020-04-21 MED ORDER — CHLORHEXIDINE GLUCONATE CLOTH 2 % EX PADS
6.0000 | MEDICATED_PAD | Freq: Every day | CUTANEOUS | Status: DC
  Administered 2020-04-21 – 2020-04-23 (×3): 6 via TOPICAL

## 2020-04-21 MED ORDER — POLYETHYLENE GLYCOL 3350 17 G PO PACK
17.0000 g | PACK | Freq: Every day | ORAL | Status: DC
  Administered 2020-04-21: 17 g via ORAL
  Filled 2020-04-21: qty 1

## 2020-04-21 NOTE — Consult Note (Signed)
Referring Provider: No ref. provider found Primary Care Physician:  Sharilyn Sites, MD Primary Gastroenterologist:  Dr. Gala Romney  Date of Admission: 04/20/20 Date of Consultation: 04/21/20  Reason for Consultation:  Abdominal distension  HPI:  Ian Aguilar is a 80 y.o. male with a past medical history of COPD, cirrhosis presumably related to EtOH abuse, hypertension, right lung nodule, hyperlipidemia  Patient was last seen in our office 04/06/2020 for follow-up on cirrhosis.  Ongoing oncology follow-up due to right upper lobe lung carcinoma.  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  Due for surveillance MRI in April 2022 recommended every 2 years.  EGD up-to-date next due 2023.  Requested to hold off on MRI at his last visit and deferred to right upper quadrant ultrasound.  Overall cirrhosis felt to be stable.  However, ultrasound had not been completed.  He presented the emergency department with complaints of gradual buildup of abdominal stanchion over the previous 4 weeks and 35 pound weight gain, increasing fatigue and sleepiness.  Normal bowel movements, nausea but no vomiting.  No abdominal pain.  His biggest complaints were increasing fatigue and difficulty with ambulating due to abdominal distention.  In the ED his vitals are stable, mild leukopenia with a white blood cell count 3.6 (baseline 5.0).  IMA globin stable at 11.5.  Hyponatremia with sodium at 130.  Creatinine 1.40 (baseline 0.75).  No elevated BUN.  Lipase normal at 30.  Mild elevation in transaminases with AST/ALT of 146/57 (baseline normal to mild elevation with AST/ALT 53/27 6 months prior).  Right upper quadrant ultrasound completed which showed a cirrhotic liver and very small amount of free fluid.  CT of the abdomen found pancreas with signs of mild atrophy and hypoattenuating lesion in the genu of the pancreas measuring 8 mm  unchanged since 2019 and not associated with ductal dilation.  Also noted mild peripheral biliary duct distention in the left hepatic lobe, no focal suspicious hepatic lesions.  Suggestion of added density within the central portion of the left hepatic lobe raising the question of indolent biliary neoplasm measuring as large as 1 cm x 0.8 cm.  No CBD dilation.  Also noted question of gastric thickening possibly gastritis versus portal gastropathy.  Recommended outpatient dedicated abdominal MRI/MRCP when patient able to hold breath appropriately.  Today he states he is doing fair today. He agrees that his abdomen began becoming distended gradually over the past 3 to 4 weeks. Denies abdominal pain, vomiting. Has a daily bowel movement which is generally consistent with [place] 4. Not sure if there is been any blood or melena because he does not look. Denies any lower extremity edema. Denies jaundice, darkened urine, encephalopathy. No other overt GI complaints.  Past Medical History:  Diagnosis Date  . Anxiety   . Arthritis   . Cancer ([place])   . Cirrhosis ([place])    confirmed by MRI on 07/04/11.    Marland Kitchen COPD (chronic obstructive pulmonary disease) ([place])   . Depression with anxiety   . ETOH abuse   . Hyperlipidemia   . Hypertension    diet control  . Nodule of upper lobe of right lung    with mediastinal adenopathy  . Wears dentures   . Wears glasses     Past Surgical History:  Procedure Laterality Date  . bilateral cataract surgery       . broken arm  6 yrs ago  left otif of wrist-[place]  . CLOSED REDUCTION WRIST FRACTURE Right 09/02/2015   Procedure: RIGHT WRIST REDUCTION;  Surgeon: Renette Butters, MD;  Location: [facility];  Service: Orthopedics;  Laterality: Right;  with MAC  . COLONOSCOPY  1996   [place]: external hemorrhoids, no polyps  . COLONOSCOPY  06/28/2011   Dr. Ala Bent diverticulosis,tubular adenoma, hyperplastic polyp  . COLONOSCOPY WITH PROPOFOL N/A 04/26/2017   Dr.  Gala Romney: perianal and digital rectal examinations normal, diffusely congested colonic mucosa but otherwise normal  . ESOPHAGOGASTRODUODENOSCOPY  06/28/2011   Dr. Chelsea Aus erosive reflux esophagitis, hiatal hernia-gastritis  . ESOPHAGOGASTRODUODENOSCOPY (EGD) WITH PROPOFOL N/A 04/26/2017   Dr. Gala Romney: normal esophagus, small hiatal hernia, GAVE, portal hypertensive gastropathy, normal duodenal bulb and second portion, no specimens collected. 2 years screening   . ESOPHAGOGASTRODUODENOSCOPY (EGD) WITH PROPOFOL N/A 06/19/2019   normal esophagus, portal gastropathy, GAVE, normal duodenum. 2 years screening.   Marland Kitchen EXTERNAL EAR SURGERY     right ear-cleaned out ear and created new eardrum  . INGUINAL HERNIA REPAIR  2-3 yrs ago   left-[place]_  . INTRAMEDULLARY (IM) NAIL INTERTROCHANTERIC Right 09/02/2015   Procedure: RIGHT  INTERTROCHANTRIC HIP;  Surgeon: Renette Butters, MD;  Location: [facility];  Service: Orthopedics;  Laterality: Right;  With MAC  . KNEE SURGERY     left-arthroscopy-[place]  . PORTACATH PLACEMENT Left 06/08/2017   Procedure: INSERTION POWER PORT WITH  ATTACHED CATHETER LEFT SUBCLAVIAN;  Surgeon: Aviva Signs, MD;  Location: [facility];  Service: General;  Laterality: Left;  . SKULL FRACTURE ELEVATION     fractured cheeck bone repaired  . TOTAL HIP ARTHROPLASTY  12/18/2011   Procedure: TOTAL HIP ARTHROPLASTY;  Surgeon: Carole Civil, MD;  Location: [facility];  Service: Orthopedics;  Laterality: Left;  Marland Kitchen VIDEO BRONCHOSCOPY WITH ENDOBRONCHIAL ULTRASOUND N/A 05/04/2017   Procedure: VIDEO BRONCHOSCOPY WITH ENDOBRONCHIAL ULTRASOUND;  Surgeon: Melrose Nakayama, MD;  Location: [facility];  Service: Thoracic;  Laterality: N/A;  . WISDOM TOOTH EXTRACTION      Prior to Admission medications   Medication Sig Start Date End Date Taking? Authorizing Provider  cholecalciferol (VITAMIN D) 1000 units tablet Take 1,000 Units by mouth daily.    [provider]  guaiFENesin (MUCINEX) 600 MG 12  hr tablet Take 600 mg by mouth as needed (congestion).    [provider]  levothyroxine (SYNTHROID) 100 MCG tablet Take 1 tablet (100 mcg total) by mouth daily before breakfast. Patient not taking: Reported on 04/06/2020 12/23/19   Derek Jack, MD  meclizine (ANTIVERT) 25 MG tablet Take 25 mg by mouth as needed for dizziness. 12/07/17   [provider]  Propylene Glycol-Glycerin 0.6-0.6 % SOLN Place 1-2 drops into both eyes as needed (for dry/irritated eyes.).    [provider]  rosuvastatin (CRESTOR) 10 MG tablet Take 10 mg by mouth daily. Patient not taking: Reported on 04/06/2020 06/13/19   [provider]  SPIRIVA HANDIHALER 18 MCG inhalation capsule Place 18 mcg into inhaler and inhale daily.  10/18/17   [provider]    Current Facility-Administered Medications  Medication Dose Route Frequency Provider Last Rate Last Admin  . acetaminophen (TYLENOL) tablet 650 mg  650 mg Oral Q6H PRN Zierle-Ghosh, Asia B, DO       Or  . acetaminophen (TYLENOL) suppository 650 mg  650 mg Rectal Q6H PRN Zierle-Ghosh, Asia B, DO      . guaiFENesin (MUCINEX) 12 hr tablet 600 mg  600 mg Oral PRN Zierle-Ghosh, [place] B,  DO      . heparin injection 5,000 Units  5,000 Units Subcutaneous Q8H Zierle-Ghosh, Asia B, DO   5,000 Units at 04/21/20 0542  . levothyroxine (SYNTHROID) tablet 100 mcg  100 mcg Oral Q0600 Zierle-Ghosh, Asia B, DO   100 mcg at 04/21/20 0541  . ondansetron (ZOFRAN) tablet 4 mg  4 mg Oral Q6H PRN Zierle-Ghosh, Asia B, DO       Or  . ondansetron (ZOFRAN) injection 4 mg  4 mg Intravenous Q6H PRN Zierle-Ghosh, Asia B, DO      . oxyCODONE (Oxy IR/ROXICODONE) immediate release tablet 5 mg  5 mg Oral Q4H PRN Zierle-Ghosh, Asia B, DO      . umeclidinium bromide (INCRUSE ELLIPTA) 62.5 MCG/INH 1 puff  1 puff Inhalation Daily Zierle-Ghosh, Asia B, DO   1 puff at 04/21/20 6333   Current Outpatient Medications  Medication Sig Dispense Refill  .  cholecalciferol (VITAMIN D) 1000 units tablet Take 1,000 Units by mouth daily.    Marland Kitchen guaiFENesin (MUCINEX) 600 MG 12 hr tablet Take 600 mg by mouth as needed (congestion).    Marland Kitchen levothyroxine (SYNTHROID) 100 MCG tablet Take 1 tablet (100 mcg total) by mouth daily before breakfast. (Patient not taking: Reported on 04/06/2020) 30 tablet 2  . meclizine (ANTIVERT) 25 MG tablet Take 25 mg by mouth as needed for dizziness.  0  . Propylene Glycol-Glycerin 0.6-0.6 % SOLN Place 1-2 drops into both eyes as needed (for dry/irritated eyes.).    Marland Kitchen rosuvastatin (CRESTOR) 10 MG tablet Take 10 mg by mouth daily. (Patient not taking: Reported on 04/06/2020)    . SPIRIVA HANDIHALER 18 MCG inhalation capsule Place 18 mcg into inhaler and inhale daily.   10    Allergies as of 04/20/2020  . (No Known Allergies)    Family History  Problem Relation Age of Onset  . Stroke Mother 67  . Heart disease Father 33       MI  . Diabetes Maternal Uncle   . Colon cancer Neg Hx   . Cancer Neg Hx     Social History   Socioeconomic History  . Marital status: Married    Spouse name: Not on file  . Number of children: Not on file  . Years of education: Not on file  . Highest education level: Not on file  Occupational History  . Occupation: retired    Fish farm manager: [employer]  Tobacco Use  . Smoking status: Former Smoker    Packs/day: 0.00    Years: 62.00    Pack years: 0.00    Types: Cigarettes    Quit date: 02/28/2019    Years since quitting: 1.1  . Smokeless tobacco: Never Used  . Tobacco comment: 1/2 ppd > 60 years as of 06/27/17: 4-5 cigarettes or less a day  Vaping Use  . Vaping Use: Never used  Substance and Sexual Activity  . Alcohol use: Not Currently    Comment: stopped 4 years ago  . Drug use: No  . Sexual activity: Yes    Birth control/protection: None  Other Topics Concern  . Not on file  Social History Narrative  . Not on file   Social Determinants of Health   Financial Resource  Strain: Not on file  Food Insecurity: Not on file  Transportation Needs: Not on file  Physical Activity: Not on file  Stress: Not on file  Social Connections: Not on file  Intimate Partner Violence: Not on file  Review of Systems: General: Negative for anorexia, weight loss, fever, chills. Eyes: Negative for vision changes.  ENT: Negative for hoarseness, difficulty swallowing. CV: Negative for chest pain, angina, palpitations, peripheral edema.  Respiratory: Negative for dyspnea at rest, cough, sputum, wheezing.  GI: See history of present illness. GU:  Negative for dysuria, hematuria, urinary incontinence, urinary frequency, nocturnal urination.  MS: Negative for joint pain, low back pain.  Neuro: Negative for memory loss, confusion.  Endo: Negative for unusual weight change.  Heme: Negative for bruising or bleeding. Allergy: Negative for rash or hives.  Physical Exam: Vital signs in last 24 hours: Temp:  [98.7 F (37.1 C)] 98.7 F (37.1 C) (02/22 1529) Pulse Rate:  [47-66] 58 (02/23 0800) Resp:  [12-36] 36 (02/23 0800) BP: (137-178)/(62-105) 162/80 (02/23 0800) SpO2:  [93 %-100 %] 96 % (02/23 0800) Weight:  [88.5 kg] 88.5 kg (02/22 1526)   General:   Alert,  Well-developed, well-nourished, pleasant and cooperative in NAD Head:  Normocephalic and atraumatic. Eyes:  Sclera clear, no icterus. Conjunctiva pink. Ears:  Normal auditory acuity. Neck:  Supple; no masses or thyromegaly. Lungs:  Clear throughout to auscultation. No wheezes, crackles, or rhonchi. No acute distress. Heart:  Regular rate and rhythm; no murmurs, clicks, rubs,  or gallops. Abdomen:  Noted abdominal distension but soft and non-tender. No masses, hepatosplenomegaly or hernias noted. Normal bowel sounds, without guarding, and without rebound.   Rectal:  Deferred.   Msk:  Symmetrical without gross deformities. Pulses:  Normal bilateral DP pulses noted. Extremities:  Without clubbing or  edema. Neurologic:  Alert and  oriented x4;  grossly normal neurologically. Psych:  Alert and cooperative. Normal mood and affect.  Intake/Output from previous day: 02/22 0701 - 02/23 0700 In: 250 [IV Piggyback:250] Out: -  Intake/Output this shift: No intake/output data recorded.  Lab Results: Recent Labs    04/20/20 1615 04/21/20 0423  WBC 3.6* 4.3  HGB 11.5* 11.4*  HCT 34.4* 35.4*  PLT 163 155   BMET Recent Labs    04/20/20 1615 04/21/20 0423  NA 130* 134*  K 4.1 3.9  CL 94* 92*  CO2 30 31  GLUCOSE 89 88  BUN 15 14  CREATININE 1.40* 1.34*  CALCIUM 8.9 9.2   LFT Recent Labs    04/20/20 1615 04/21/20 0423  PROT 6.9 6.8  ALBUMIN 3.9 3.9  AST 146* 142*  ALT 57* 55*  ALKPHOS 90 90  BILITOT 0.9 1.2   PT/INR Recent Labs    04/20/20 1615  LABPROT 13.4  INR 1.1   Hepatitis Panel No results for input(s): HEPBSAG, HCVAB, HEPAIGM, HEPBIGM in the last 72 hours. C-Diff No results for input(s): CDIFFTOX in the last 72 hours.  Studies/Results: US Abdomen Complete  Result Date: 04/20/2020 CLINICAL DATA:  Cirrhosis and abdominal distension. EXAM: ABDOMEN ULTRASOUND COMPLETE COMPARISON:  None. FINDINGS: Gallbladder: No gallstones or wall thickening visualized (2.4 mm). No sonographic Murphy sign noted by sonographer. Common bile duct: Diameter: 3.7 mm Liver: The echotexture of the liver is coarse and nodular in appearance. No focal lesion identified. Diffusely increased echogenicity of the liver parenchyma is seen. Portal vein is patent on color Doppler imaging with normal direction of blood flow towards the liver. IVC: No abnormality visualized. Pancreas: Visualized portion unremarkable. Spleen: Size (7.8 cm) and appearance within normal limits. Right Kidney: Length: 10.2 cm. Echogenicity within normal limits. No mass or hydronephrosis visualized. Left Kidney: Length: 10.6 cm. Echogenicity within normal limits. No mass or hydronephrosis visualized. Abdominal aorta:  No  aneurysm visualized (1.8 cm in AP diameter). Other findings: A mild amount of free fluid is seen within the right lower quadrant. IMPRESSION: 1. Cirrhotic liver. 2. Small amount of free fluid. Electronically Signed   By: Virgina Norfolk M.D.   On: 04/20/2020 19:16   CT ABDOMEN PELVIS W CONTRAST  Result Date: 04/20/2020 CLINICAL DATA:  Abdominal pain and bloating with nausea for 4 days. EXAM: CT ABDOMEN AND PELVIS WITH CONTRAST TECHNIQUE: Multidetector CT imaging of the abdomen and pelvis was performed using the standard protocol following bolus administration of intravenous contrast. CONTRAST:  162m OMNIPAQUE IOHEXOL 300 MG/ML  SOLN COMPARISON:  Multiple prior studies most recent from October of 2021 FINDINGS: Lower chest: Minimal basilar atelectasis. Hepatobiliary: Mild peripheral biliary duct distension in the LEFT hepatic lobe lateral segment. Periportal hypoattenuation unchanged dating back to October of 2019. No focal, suspicious hepatic lesion on venous phase assessment. However, on delayed phase imaging there is suggestion of added density within the central portion of this area raising the question of indolent biliary neoplasm. (Image 96 of series 7) perhaps measuring as large as 1 x 0.8 cm hepatic veins are patent. Gallbladder is nondistended. Common bile duct is nondilated. Pancreas: Pancreas with signs of mild atrophy. Hypoattenuating lesion in the genu of the pancreas measures 8 mm unchanged since 2019 and not associated with main duct distension or inflammation. Spleen: Spleen normal size with small amount of perisplenic ascites. Adrenals/Urinary Tract: Adrenal glands are normal. Symmetric renal enhancement. No hydronephrosis. Smooth contour the urinary bladder. Stomach/Bowel: Mild gastric thickening is suggested versus under distension with little change. No small bowel dilation. No significant perienteric stranding. Mild generalized mesenteric edema in the setting of portal hypertension now  with ascites. Appendix not clearly visualized, perhaps partially visualized along the RIGHT flank. No specific secondary signs of acute appendicitis Vascular/Lymphatic: Calcified and noncalcified atheromatous plaque in the abdominal aorta. There is no gastrohepatic or hepatoduodenal ligament lymphadenopathy. No retroperitoneal or mesenteric lymphadenopathy. Smooth contour the IVC. No pelvic sidewall lymphadenopathy. Streak artifact from LEFT hip arthroplasty limits assessment. Changes of RIGHT hip ORIF also limiting assessment. Reproductive: Prostate unremarkable by CT. Other: Small volume ascites is new compared to previous imaging. No free air. Small fat containing umbilical hernia. Musculoskeletal: Spinal degenerative changes. No acute or destructive bone process. Post RIGHT femoral ORIF. Post LEFT total hip arthroplasty. IMPRESSION: 1. Cirrhosis with signs of ascites, new from prior imaging. 2. Question of gastric thickening may represent gastritis or portal gastropathy. 3. Peribiliary hypoattenuation perhaps a combination of biliary cysts and biliary duct distension. Question of lesion within the biliary ducts in hepatic subsegment III where there is variable attenuation within the ducts best seen on image 36 of series 5. Stability argues against aggressive process but indolent biliary neoplasm or small stones could have this appearance. When the patient is clinically stable and able to follow directions and hold their breath (preferably as an outpatient) further evaluation with dedicated abdominal MRI/MRCP should be considered. 4. Hypoattenuating lesion in the genu of the pancreas measures 8 mm and unchanged since 2019 and not associated with main duct distension or inflammation. 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 Consider follow-up in 1 year with MRI. 5. Aortic atherosclerosis. Aortic Atherosclerosis (ICD10-I70.0). Electronically Signed   By: GZetta BillsM.D.    On: 04/20/2020 21:24   DG Hand Complete Right  Result Date: 04/20/2020 CLINICAL DATA:  Right hand pain. Fall last night going into closet. Pain, swelling and bruising  about the fifth metacarpal. EXAM: RIGHT HAND - COMPLETE 3+ VIEW COMPARISON:  None. FINDINGS: Minimally displaced and angulated distal fifth metacarpal fracture. There is no intra-articular extension. No other fracture of the hand. The bones are diffusely under mineralized. There is mild osteoarthritis most prominently involving the thumb carpal metacarpal. Remote healed distal radius fracture. Soft tissue edema about the ulnar aspect of the hand. IMPRESSION: Minimally displaced and angulated distal fifth metacarpal fracture. No intra-articular extension. Electronically Signed   By: Keith Rake M.D.   On: 04/20/2020 15:45    Impression: 80 year old male presents for nausea and abdominal distention.  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  However, there are new concerning findings on his CT for possibly indolent biliary neoplasm with recommended MRI/MRCP, preferably as outpatient when able to do breath holds. His cirrhosis has been stable recently.  Cirrhosis: Appears stable.  He did have mild bump in his LFTs and acute viral hepatitis panel is pending.  Abdominal imaging is reassuring for no significant ascites to account for his abdominal distention. MELD 11 based on labs yesterday. No need for paracentesis at this time. Denies alcohol and "sometime" of at least 1 year.  Abdominal distention and nausea: No obvious ascites on CT imaging. His abdomen is obviously distended today but it is soft with no tense ascites. No vomiting or abdominal pain. He notes he has a scheduled MRI as an outpatient in April. He states he has had an MRI before and feels he could follow directions including breath holds, as needed today. Differentials include biliary neoplasm, less likely stone. Given his  ongoing lung cancer, cannot rule out metastasis. No obvious ascites to suggest hepatic decompensation. Depending on MRI findings, may eventually need an ERCP.   Plan: 1. Electrolyte replacement per hospitalist 2. Trend LFTs 3. MRI/MRCP today 4. Further recommendations to follow 5. Supportive care   Thank you for allowing [place] to participate in the care of Christeen Douglas, DNP, AGNP-C Adult & Gerontological Nurse Practitioner [facility]   LOS: 1 day     04/21/2020, 8:32 AM

## 2020-04-21 NOTE — Plan of Care (Signed)

## 2020-04-21 NOTE — ED Notes (Signed)
Pt placed on 2LPM via [place]. Pt wears 2LPM at night

## 2020-04-21 NOTE — Progress Notes (Signed)
[facility] OF CARE NOTE Patient: Ian Aguilar LWH:[id number]   PCP: Sharilyn Sites, MD DOB: 08-19-1940   DOA: 04/20/2020   DOS: 04/21/2020    Patient was admitted by my colleague earlier on 04/21/2020. I have reviewed the H&P as well as assessment and plan and agree with the same. Important changes in the plan are listed below.  Plan of care: Active Problems:   Abdominal distension   Distention of biliary tract   AKI (acute kidney injury) ([place])   Hypothyroidism GI consulted. Patient will be undergoing MRCP. Follow recommendation.  Bladder distended. Recommended RN to monitor post void bladder scan.  Constipation. Bowel regimen initiated.  Right wrist fracture. Orthopedics consulted. Pain well controlled.  Severe hypothyroidism. TSH is 290.  Patient is supposed to be on Synthroid 100 MCG. Not sure patient is compliant with his regimen. We will recheck TSH as well as free T4. Continue with Synthroid but dose will be increased. The patient is able to tolerate p.o. medication and is also not appearing to be affected by myxedema or coma I will continue with p.o. replacement for now.  Author: Berle Mull, MD [facility] 04/21/2020 4:02 PM   If 7PM-7AM, please contact night-coverage at [email or web address]

## 2020-04-21 NOTE — H&P (Signed)
TRH H&P    Patient Demographics:    Ian Aguilar, is a 80 y.o. male  MRN: [id number]  DOB - 03-01-1940  Admit Date - 04/20/2020  Referring MD/NP/PA: Roderic Palau  Outpatient Primary MD for the patient is Sharilyn Sites, MD  Patient coming from: Home  Chief complaint-abdominal distention   HPI:    Ian Aguilar  is a 80 y.o. male, with history of upper lobe right lung nodule, hypertension, hyperlipidemia, alcohol abuse-quit 4 years ago, COPD, cirrhosis, anxiety presents to the ED with a chief complaint of abdominal distention.  Reports that he has had gradual buildup of abdominal distention over the last 4 weeks.  He has an associated 35 pound weight gain.  He has had an associated gradual increase in fatigue as well, but then this weekend he slept more than 24 hours in a row.  This was concerning for him.  He reports that up until that sleeping more than 24 hours in a row, he was eating and drinking normally.  His bowel movements have been normal.  He has felt nauseous but has not thrown up.  He denies any belly pain.  He denies any changes in his urine.  Patient still has his gallbladder.  At first he reports that he has not had liver problems but then reports that he has cirrhosis so maybe he does have liver problems.  Patient denies any fevers, change in his shortness of breath or cough, chest pain, palpitations.  Patient reports that the two things that mostly triggered him to come into the ER, the increased fatigue, and the difficulty with moving around with his belly so distended.  Patient quit smoking 4 years ago, has 1 cigarette a month since then.  He quit drinking 4 years ago.  He has had three Covid shots.  Patient prefers to be DNR.  In the ED Temp 98.7, heart rate 47-63, respiratory rate 12-19, blood pressure 140/99, satting at 96% on room air Mild leukopenia with a white blood cell count of 3.6, baseline  white blood cell seem to be around 5.0 Hemoglobin is 11.5 Sodium is mildly low at 130, potassium 4.1, creatinine is 1.40 and is usually around 0.75.  Despite his sleeping for 24 hours his BUN to creatinine ratio is less than 20 Lipase is 30 AST elevated at 146, ALT 57 Ultrasound shows a cirrhotic liver and small amount of free fluid CT abdomen shows biliary duct distention that could be neoplasm versus stones GI consulted from the ED and recommends admission and they will see in the a.m.   Review of systems:    In addition to the HPI above,  No Fever-chills, No Headache, No changes with Vision or hearing, No problems swallowing food or Liquids, No Chest pain, Cough or Shortness of Breath, No Abdominal pain, admits to nausea but no vomiting, bowel movements are regular, No Blood in stool or Urine, No dysuria, No new skin rashes or bruises, No new joints pains-aches,  No new weakness, tingling, numbness in any extremity, No recent weight gain or loss,  No polyuria, polydypsia or polyphagia, No significant Mental Stressors.  All other systems reviewed and are negative.    Past History of the following :    Past Medical History:  Diagnosis Date  . Anxiety   . Arthritis   . Cancer ([employer])   . Cirrhosis ([place])    confirmed by MRI on 07/04/11.    Marland Kitchen COPD (chronic obstructive pulmonary disease) ([place])   . Depression with anxiety   . ETOH abuse   . Hyperlipidemia   . Hypertension    diet control  . Nodule of upper lobe of right lung    with mediastinal adenopathy  . Wears dentures   . Wears glasses       Past Surgical History:  Procedure Laterality Date  . bilateral cataract surgery     [employer]  . broken arm  6 yrs ago   left otif of wrist-[place]  . CLOSED REDUCTION WRIST FRACTURE Right 09/02/2015   Procedure: RIGHT WRIST REDUCTION;  Surgeon: Renette Butters, MD;  Location: [place];  Service: Orthopedics;  Laterality: Right;  with MAC  . COLONOSCOPY  1996   [place]:  external hemorrhoids, no polyps  . COLONOSCOPY  06/28/2011   Dr. Ala Bent diverticulosis,tubular adenoma, hyperplastic polyp  . COLONOSCOPY WITH PROPOFOL N/A 04/26/2017   Dr. Gala Romney: perianal and digital rectal examinations normal, diffusely congested colonic mucosa but otherwise normal  . ESOPHAGOGASTRODUODENOSCOPY  06/28/2011   Dr. Chelsea Aus erosive reflux esophagitis, hiatal hernia-gastritis  . ESOPHAGOGASTRODUODENOSCOPY (EGD) WITH PROPOFOL N/A 04/26/2017   Dr. Gala Romney: normal esophagus, small hiatal hernia, GAVE, portal hypertensive gastropathy, normal duodenal bulb and second portion, no specimens collected. 2 years screening   . ESOPHAGOGASTRODUODENOSCOPY (EGD) WITH PROPOFOL N/A 06/19/2019   normal esophagus, portal gastropathy, GAVE, normal duodenum. 2 years screening.   Marland Kitchen EXTERNAL EAR SURGERY     right ear-cleaned out ear and created new eardrum  . INGUINAL HERNIA REPAIR  2-3 yrs ago   left-[facility]  . INTRAMEDULLARY (IM) NAIL INTERTROCHANTERIC Right 09/02/2015   Procedure: RIGHT  INTERTROCHANTRIC HIP;  Surgeon: Renette Butters, MD;  Location: [place];  Service: Orthopedics;  Laterality: Right;  With MAC  . KNEE SURGERY     left-arthroscopy-[place]  . PORTACATH PLACEMENT Left 06/08/2017   Procedure: INSERTION POWER PORT WITH  ATTACHED CATHETER LEFT SUBCLAVIAN;  Surgeon: Aviva Signs, MD;  Location: [facility];  Service: General;  Laterality: Left;  . SKULL FRACTURE ELEVATION     fractured cheeck bone repaired  . TOTAL HIP ARTHROPLASTY  12/18/2011   Procedure: TOTAL HIP ARTHROPLASTY;  Surgeon: Carole Civil, MD;  Location: [facility];  Service: Orthopedics;  Laterality: Left;  Marland Kitchen VIDEO BRONCHOSCOPY WITH ENDOBRONCHIAL ULTRASOUND N/A 05/04/2017   Procedure: VIDEO BRONCHOSCOPY WITH ENDOBRONCHIAL ULTRASOUND;  Surgeon: Melrose Nakayama, MD;  [facility];  Service: Thoracic;  Laterality: N/A;  . WISDOM TOOTH EXTRACTION        Social History:      Social History   Tobacco Use   . Smoking status: Former Smoker    Packs/day: 0.00    Years: 62.00    Pack years: 0.00    Types: Cigarettes    Quit date: 02/28/2019    Years since quitting: 1.1  . Smokeless tobacco: Never Used  . Tobacco comment: 1/2 ppd > 60 years as of 06/27/17: 4-5 cigarettes or less a day  Substance Use Topics  . Alcohol use: Not Currently    Comment: stopped 4 years ago  Family History :     Family History  Problem Relation Age of Onset  . Stroke Mother 30  . Heart disease Father 79       MI  . Diabetes Maternal Uncle   . Colon cancer Neg Hx   . Cancer Neg Hx       Home Medications:   Prior to Admission medications   Medication Sig Start Date End Date Taking? Authorizing Provider  cholecalciferol (VITAMIN D) 1000 units tablet Take 1,000 Units by mouth daily.    [provider]  guaiFENesin (MUCINEX) 600 MG 12 hr tablet Take 600 mg by mouth as needed (congestion).    [provider]  levothyroxine (SYNTHROID) 100 MCG tablet Take 1 tablet (100 mcg total) by mouth daily before breakfast. Patient not taking: Reported on 04/06/2020 12/23/19   Derek Jack, MD  meclizine (ANTIVERT) 25 MG tablet Take 25 mg by mouth as needed for dizziness. 12/07/17   [provider]  Propylene Glycol-Glycerin 0.6-0.6 % SOLN Place 1-2 drops into both eyes as needed (for dry/irritated eyes.).    [provider]  rosuvastatin (CRESTOR) 10 MG tablet Take 10 mg by mouth daily. Patient not taking: Reported on 04/06/2020 06/13/19   [provider]  SPIRIVA HANDIHALER 18 MCG inhalation capsule Place 18 mcg into inhaler and inhale daily.  10/18/17   [provider]     Allergies:    No Known Allergies   Physical Exam:   Vitals  Blood pressure (!) 140/99, pulse 61, temperature 98.7 F (37.1 C), temperature source Oral, resp. rate 16, height _0  (1.803 m), weight 88.5 kg, SpO2 99 %.  1.  General: Lying supine in bed in no acute distress  2.  Psychiatric: Mood and behavior normal for situation, alert and oriented x3, pleasant cooperative with exam  3. Neurologic: Face is symmetric, speech and language are normal, moves all four extremities voluntarily, no focal deficit on limited exam  4. HEENMT:  Head is atraumatic, normocephalic, pupils reactive to light, neck is supple, trachea is midline, mucous membranes are mildly dry  5. Respiratory : Lungs are without wheeze, rhonchi, rales, diminished breath sounds in the lower lung fields, no cyanosis  6. Cardiovascular : Heart rate is normal, rhythm is regular, no murmurs rubs or gallops  7. Gastrointestinal:  Abdomen is markedly distended, soft, nontender to palpation, bowel sounds hypoactive,  8. Skin:  Skin is warm dry and intact without acute lesion on limited exam  9.Musculoskeletal:  Port over left chest wall, no peripheral edema, no calf tenderness no acute deformity    Data Review:    CBC Recent Labs  Lab 04/20/20 1615  WBC 3.6*  HGB 11.5*  HCT 34.4*  PLT 163  MCV 94.8  MCH 31.7  MCHC 33.4  RDW 17.5*  LYMPHSABS 0.8  MONOABS 0.3  EOSABS 0.1  BASOSABS 0.0   ------------------------------------------------------------------------------------------------------------------  Results for orders placed or performed during the hospital encounter of 04/20/20 (from the past 48 hour(s))  Resp Panel by RT-PCR (Flu A&B, Covid) Nasopharyngeal Swab     Status: None   Collection Time: 04/20/20  4:08 PM   Specimen: Nasopharyngeal Swab; Nasopharyngeal(NP) swabs in vial transport medium  Result Value Ref Range   SARS Coronavirus 2 by RT PCR NEGATIVE NEGATIVE    Comment: (NOTE) SARS-CoV-2 target nucleic acids are NOT DETECTED.  The SARS-CoV-2 RNA is generally detectable in upper respiratory specimens during the acute phase of infection. The lowest concentration of SARS-CoV-2 viral copies  this assay can detect is 138 copies/mL. A negative result does not preclude  SARS-Cov-2 infection and should not be used as the sole basis for treatment or other patient management decisions. A negative result may occur with  improper specimen collection/handling, submission of specimen other than nasopharyngeal swab, presence of viral mutation(s) within the areas targeted by this assay, and inadequate number of viral copies(<138 copies/mL). A negative result must be combined with clinical observations, patient history, and epidemiological information. The expected result is Negative.  Fact Sheet for Patients:  EntrepreneurPulse.com.au  Fact Sheet for Healthcare Providers:  IncredibleEmployment.be  This test is no t yet approved or cleared by the [place] FDA and  has been authorized for detection and/or diagnosis of SARS-CoV-2 by FDA under an Emergency Use Authorization (EUA). This EUA will remain  in effect (meaning this test can be used) for the duration of the COVID-19 declaration under Section 564(b)(1) of the Act, 21 U.S.C.section 360bbb-3(b)(1), unless the authorization is terminated  or revoked sooner.       Influenza A by PCR NEGATIVE NEGATIVE   Influenza B by PCR NEGATIVE NEGATIVE    Comment: (NOTE) The Xpert Xpress SARS-CoV-2/FLU/RSV plus assay is intended as an aid in the diagnosis of influenza from Nasopharyngeal swab specimens and should not be used as a sole basis for treatment. Nasal washings and aspirates are unacceptable for Xpert Xpress SARS-CoV-2/FLU/RSV testing.  Fact Sheet for Patients: EntrepreneurPulse.com.au  Fact Sheet for Healthcare Providers: IncredibleEmployment.be  This test is not yet approved or cleared by the [place] FDA and has been authorized for detection and/or diagnosis of SARS-CoV-2 by FDA under an Emergency Use Authorization (EUA). This EUA will remain in effect (meaning this test can be used) for the duration of the COVID-19  declaration under Section 564(b)(1) of the Act, 21 U.S.C. section 360bbb-3(b)(1), unless the authorization is terminated or revoked.  Performed at [facility], [address] [place], [place] [address]   CBC with Differential/Platelet     Status: Abnormal   Collection Time: 04/20/20  4:15 PM  Result Value Ref Range   WBC 3.6 (L) 4.0 - 10.5 K/uL   RBC 3.63 (L) 4.22 - 5.81 MIL/uL   Hemoglobin 11.5 (L) 13.0 - 17.0 g/dL   HCT 34.4 (L) 39.0 - 52.0 %   MCV 94.8 80.0 - 100.0 fL   MCH 31.7 26.0 - 34.0 pg   MCHC 33.4 30.0 - 36.0 g/dL   RDW 17.5 (H) 11.5 - 15.5 %   Platelets 163 150 - 400 K/uL   nRBC 0.0 0.0 - 0.2 %   Neutrophils Relative % 65 %   Neutro Abs 2.3 1.7 - 7.7 K/uL   Lymphocytes Relative 23 %   Lymphs Abs 0.8 0.7 - 4.0 K/uL   Monocytes Relative 7 %   Monocytes Absolute 0.3 0.1 - 1.0 K/uL   Eosinophils Relative 4 %   Eosinophils Absolute 0.1 0.0 - 0.5 K/uL   Basophils Relative 1 %   Basophils Absolute 0.0 0.0 - 0.1 K/uL   Immature Granulocytes 0 %   Abs Immature Granulocytes 0.01 0.00 - 0.07 K/uL    Comment: Performed at [facility], [address], [place], [place] [address]  Comprehensive metabolic panel     Status: Abnormal   Collection Time: 04/20/20  4:15 PM  Result Value Ref Range   Sodium 130 (L) 135 - 145 mmol/L   Potassium 4.1 3.5 - 5.1 mmol/L   Chloride 94 (L) 98 - 111 mmol/L  CO2 30 22 - 32 mmol/L   Glucose, Bld 89 70 - 99 mg/dL    Comment: Glucose reference range applies only to samples taken after fasting for at least 8 hours.   BUN 15 8 - 23 mg/dL   Creatinine, Ser 1.40 (H) 0.61 - 1.24 mg/dL   Calcium 8.9 8.9 - 10.3 mg/dL   Total Protein 6.9 6.5 - 8.1 g/dL   Albumin 3.9 3.5 - 5.0 g/dL   AST 146 (H) 15 - 41 U/L   ALT 57 (H) 0 - 44 U/L   Alkaline Phosphatase 90 38 - 126 U/L   Total Bilirubin 0.9 0.3 - 1.2 mg/dL   GFR, Estimated 51 (L) >60 mL/min    Comment: (NOTE) Calculated using the CKD-EPI Creatinine Equation (2021)    Anion gap 6 5 - 15     Comment: Performed at [facility], [address] [place], [place] [address]  Ammonia     Status: None   Collection Time: 04/20/20  4:15 PM  Result Value Ref Range   Ammonia 25 9 - 35 umol/L    Comment: Performed at [facility], [address] [place], [place] [address]  Protime-INR     Status: None   Collection Time: 04/20/20  4:15 PM  Result Value Ref Range   Prothrombin Time 13.4 11.4 - 15.2 seconds   INR 1.1 0.8 - 1.2    Comment: (NOTE) INR goal varies based on device and disease states. Performed at [facility], [address], [place], [place] [address]   Lipase, blood     Status: None   Collection Time: 04/20/20  4:15 PM  Result Value Ref Range   Lipase 30 11 - 51 U/L    Comment: Performed at [facility], [address] [place], [place] [address]    Chemistries  Recent Labs  Lab 04/20/20 1615  NA 130*  K 4.1  CL 94*  CO2 30  GLUCOSE 89  BUN 15  CREATININE 1.40*  CALCIUM 8.9  AST 146*  ALT 57*  ALKPHOS 90  BILITOT 0.9   ------------------------------------------------------------------------------------------------------------------  ------------------------------------------------------------------------------------------------------------------ GFR: Estimated Creatinine Clearance: 46.3 mL/min (A) (by C-G formula based on SCr of 1.4 mg/dL (H)). Liver Function Tests: Recent Labs  Lab 04/20/20 1615  AST 146*  ALT 57*  ALKPHOS 90  BILITOT 0.9  PROT 6.9  ALBUMIN 3.9   Recent Labs  Lab 04/20/20 1615  LIPASE 30   Recent Labs  Lab 04/20/20 1615  AMMONIA 25   Coagulation Profile: Recent Labs  Lab 04/20/20 1615  INR 1.1   Cardiac Enzymes: No results for input(s): CKTOTAL, CKMB, CKMBINDEX, TROPONINI in the last 168 hours. BNP (last 3 results) No results for input(s): PROBNP in the last 8760 hours. HbA1C: No results for input(s): HGBA1C in the last 72 hours. CBG: No results for input(s): GLUCAP in the last 168 hours. Lipid Profile: No  results for input(s): CHOL, HDL, LDLCALC, TRIG, CHOLHDL, LDLDIRECT in the last 72 hours. Thyroid Function Tests: No results for input(s): TSH, T4TOTAL, FREET4, T3FREE, THYROIDAB in the last 72 hours. Anemia Panel: No results for input(s): VITAMINB12, FOLATE, FERRITIN, TIBC, IRON, RETICCTPCT in the last 72 hours.  --------------------------------------------------------------------------------------------------------------- Urine analysis:    Component Value Date/Time   COLORURINE YELLOW 10/22/2019 2111   APPEARANCEUR CLEAR 10/22/2019 2111   LABSPEC 1.030 10/22/2019 2111   PHURINE 5.0 10/22/2019 2111   GLUCOSEU NEGATIVE 10/22/2019 2111   HGBUR SMALL (A) 10/22/2019 2111   Stevens NEGATIVE 10/22/2019 2111  KETONESUR 5 (A) 10/22/2019 2111   PROTEINUR 100 (A) 10/22/2019 2111   NITRITE NEGATIVE 10/22/2019 2111   LEUKOCYTESUR NEGATIVE 10/22/2019 2111      Imaging Results:    US Abdomen Complete  Result Date: 04/20/2020 CLINICAL DATA:  Cirrhosis and abdominal distension. EXAM: ABDOMEN ULTRASOUND COMPLETE COMPARISON:  None. FINDINGS: Gallbladder: No gallstones or wall thickening visualized (2.4 mm). No sonographic Murphy sign noted by sonographer. Common bile duct: Diameter: 3.7 mm Liver: The echotexture of the liver is coarse and nodular in appearance. No focal lesion identified. Diffusely increased echogenicity of the liver parenchyma is seen. Portal vein is patent on color Doppler imaging with normal direction of blood flow towards the liver. IVC: No abnormality visualized. Pancreas: Visualized portion unremarkable. Spleen: Size (7.8 cm) and appearance within normal limits. Right Kidney: Length: 10.2 cm. Echogenicity within normal limits. No mass or hydronephrosis visualized. Left Kidney: Length: 10.6 cm. Echogenicity within normal limits. No mass or hydronephrosis visualized. Abdominal aorta: No aneurysm visualized (1.8 cm in AP diameter). Other findings: A mild amount of free fluid is  seen within the right lower quadrant. IMPRESSION: 1. Cirrhotic liver. 2. Small amount of free fluid. Electronically Signed   By: Virgina Norfolk M.D.   On: 04/20/2020 19:16   CT ABDOMEN PELVIS W CONTRAST  Result Date: 04/20/2020 CLINICAL DATA:  Abdominal pain and bloating with nausea for 4 days. EXAM: CT ABDOMEN AND PELVIS WITH CONTRAST TECHNIQUE: Multidetector CT imaging of the abdomen and pelvis was performed using the standard protocol following bolus administration of intravenous contrast. CONTRAST:  155m OMNIPAQUE IOHEXOL 300 MG/ML  SOLN COMPARISON:  Multiple prior studies most recent from October of 2021 FINDINGS: Lower chest: Minimal basilar atelectasis. Hepatobiliary: Mild peripheral biliary duct distension in the LEFT hepatic lobe lateral segment. Periportal hypoattenuation unchanged dating back to October of 2019. No focal, suspicious hepatic lesion on venous phase assessment. However, on delayed phase imaging there is suggestion of added density within the central portion of this area raising the question of indolent biliary neoplasm. (Image 96 of series 7) perhaps measuring as large as 1 x 0.8 cm hepatic veins are patent. Gallbladder is nondistended. Common bile duct is nondilated. Pancreas: Pancreas with signs of mild atrophy. Hypoattenuating lesion in the genu of the pancreas measures 8 mm unchanged since 2019 and not associated with main duct distension or inflammation. Spleen: Spleen normal size with small amount of perisplenic ascites. Adrenals/Urinary Tract: Adrenal glands are normal. Symmetric renal enhancement. No hydronephrosis. Smooth contour the urinary bladder. Stomach/Bowel: Mild gastric thickening is suggested versus under distension with little change. No small bowel dilation. No significant perienteric stranding. Mild generalized mesenteric edema in the setting of portal hypertension now with ascites. Appendix not clearly visualized, perhaps partially visualized along the RIGHT  flank. No specific secondary signs of acute appendicitis Vascular/Lymphatic: Calcified and noncalcified atheromatous plaque in the abdominal aorta. There is no gastrohepatic or hepatoduodenal ligament lymphadenopathy. No retroperitoneal or mesenteric lymphadenopathy. Smooth contour the IVC. No pelvic sidewall lymphadenopathy. Streak artifact from LEFT hip arthroplasty limits assessment. Changes of RIGHT hip ORIF also limiting assessment. Reproductive: Prostate unremarkable by CT. Other: Small volume ascites is new compared to previous imaging. No free air. Small fat containing umbilical hernia. Musculoskeletal: Spinal degenerative changes. No acute or destructive bone process. Post RIGHT femoral ORIF. Post LEFT total hip arthroplasty. IMPRESSION: 1. Cirrhosis with signs of ascites, new from prior imaging. 2. Question of gastric thickening may represent gastritis or portal gastropathy. 3. Peribiliary hypoattenuation perhaps a combination of  biliary cysts and biliary duct distension. Question of lesion within the biliary ducts in hepatic subsegment III where there is variable attenuation within the ducts best seen on image 36 of series 5. Stability argues against aggressive process but indolent biliary neoplasm or small stones could have this appearance. When the patient is clinically stable and able to follow directions and hold their breath (preferably as an outpatient) further evaluation with dedicated abdominal MRI/MRCP should be considered. 4. Hypoattenuating lesion in the genu of the pancreas measures 8 mm and unchanged since 2019 and not associated with main duct distension or inflammation. 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 Consider follow-up in 1 year with MRI. 5. Aortic atherosclerosis. Aortic Atherosclerosis (ICD10-I70.0). Electronically Signed   By: Zetta Bills M.D.   On: 04/20/2020 21:24   DG Hand Complete Right  Result Date: 04/20/2020 CLINICAL DATA:   Right hand pain. Fall last night going into closet. Pain, swelling and bruising about the fifth metacarpal. EXAM: RIGHT HAND - COMPLETE 3+ VIEW COMPARISON:  None. FINDINGS: Minimally displaced and angulated distal fifth metacarpal fracture. There is no intra-articular extension. No other fracture of the hand. The bones are diffusely under mineralized. There is mild osteoarthritis most prominently involving the thumb carpal metacarpal. Remote healed distal radius fracture. Soft tissue edema about the ulnar aspect of the hand. IMPRESSION: Minimally displaced and angulated distal fifth metacarpal fracture. No intra-articular extension. Electronically Signed   By: Keith Rake M.D.   On: 04/20/2020 15:45      Assessment & Plan:    Active Problems:   Abdominal distension   Distention of biliary tract   AKI (acute kidney injury) ([place])   Hypothyroidism   1. Abdominal distention 1. Gradual in onset, 35 pound weight gain over 4 weeks 2. Nonpainful, regular bowel movements, was eating and drinking normally up until last day 3. Follows with [facility] for cirrhosis, they recommended ultrasound which showed cirrhotic liver and small amount of free fluid 4. After ultrasound they recommend CT which showed biliary duct distention that could be neoplasm versus stones 5. GI will plan to see him in the a.m. 6. N.p.o. at this time in case of ERCP 7. Continue to monitor 2. Biliary duct distention 1. Neoplasm versus stones 2. GI to see in the a.m. 3. AKI 1. Baseline creatinine 0.75, today 1.40 2. Secondary to poor p.o. intake 3. Gentle hydration 4. Hold nephrotoxic agents when possible 5. Trend in a.m. 4. Hypothyroidism 1. Check TSH 2. Continue Synthroid 5. HLD 1. Continue Crestor 6. COPD 1. Continue Incruse Ellipta 2. Not in exacerbation at this time 3. Continue nocturnal 2 L nasal cannula-baseline   DVT Prophylaxis-Heparin- SCDs  AM Labs Ordered, also please review Full  Orders  Family Communication: No family at bedside Code Status: DNR  Admission status: Inpatient :The appropriate admission status for this patient is INPATIENT. Inpatient status is judged to be reasonable and necessary in order to provide the required intensity of service to ensure the patient's safety. The patient's presenting symptoms, physical exam findings, and initial radiographic and laboratory data in the context of their chronic comorbidities is felt to place them at high risk for further clinical deterioration. Furthermore, it is not anticipated that the patient will be medically stable for discharge from the hospital within 2 midnights of admission. The following factors support the admission status of inpatient.     The patient's presenting symptoms include abdominal distention The worrisome physical exam findings include marked distention  of abdomen The initial radiographic and laboratory data are worrisome because of biliary duct distention on CT, transaminitis, leukopenia The chronic co-morbidities include hypertension, hyperlipidemia, COPD, cirrhosis       * I certify that at the point of admission it is my clinical judgment that the patient will require inpatient hospital care spanning beyond 2 midnights from the point of admission due to high intensity of service, high risk for further deterioration and high frequency of surveillance required.*  Time spent in minutes : [place]

## 2020-04-22 ENCOUNTER — Inpatient Hospital Stay (HOSPITAL_COMMUNITY): Payer: PPO

## 2020-04-22 DIAGNOSIS — K7031 Alcoholic cirrhosis of liver with ascites: Secondary | ICD-10-CM

## 2020-04-22 DIAGNOSIS — E038 Other specified hypothyroidism: Secondary | ICD-10-CM | POA: Diagnosis present

## 2020-04-22 DIAGNOSIS — K5900 Constipation, unspecified: Secondary | ICD-10-CM

## 2020-04-22 DIAGNOSIS — E039 Hypothyroidism, unspecified: Secondary | ICD-10-CM | POA: Diagnosis present

## 2020-04-22 DIAGNOSIS — K862 Cyst of pancreas: Secondary | ICD-10-CM

## 2020-04-22 LAB — PROTIME-INR
INR: 1.1 (ref 0.8–1.2)
Prothrombin Time: 13.4 seconds (ref 11.4–15.2)

## 2020-04-22 LAB — COMPREHENSIVE METABOLIC PANEL
ALT: 49 U/L — ABNORMAL HIGH (ref 0–44)
AST: 123 U/L — ABNORMAL HIGH (ref 15–41)
Albumin: 3.7 g/dL (ref 3.5–5.0)
Alkaline Phosphatase: 84 U/L (ref 38–126)
Anion gap: 10 (ref 5–15)
BUN: 16 mg/dL (ref 8–23)
CO2: 27 mmol/L (ref 22–32)
Calcium: 8.8 mg/dL — ABNORMAL LOW (ref 8.9–10.3)
Chloride: 93 mmol/L — ABNORMAL LOW (ref 98–111)
Creatinine, Ser: 1.37 mg/dL — ABNORMAL HIGH (ref 0.61–1.24)
GFR, Estimated: 53 mL/min — ABNORMAL LOW (ref >60)
Glucose, Bld: 118 mg/dL — ABNORMAL HIGH (ref 70–99)
Potassium: 3.9 mmol/L (ref 3.5–5.1)
Sodium: 130 mmol/L — ABNORMAL LOW (ref 135–145)
Total Bilirubin: 1 mg/dL (ref 0.3–1.2)
Total Protein: 6.5 g/dL (ref 6.5–8.1)

## 2020-04-22 LAB — CBC
HCT: 35.5 % — ABNORMAL LOW (ref 39.0–52.0)
Hemoglobin: 11.5 g/dL — ABNORMAL LOW (ref 13.0–17.0)
MCH: 30.7 pg (ref 26.0–34.0)
MCHC: 32.4 g/dL (ref 30.0–36.0)
MCV: 94.7 fL (ref 80.0–100.0)
Platelets: 157 10*3/uL (ref 150–400)
RBC: 3.75 MIL/uL — ABNORMAL LOW (ref 4.22–5.81)
RDW: 17.2 % — ABNORMAL HIGH (ref 11.5–15.5)
WBC: 4.5 10*3/uL (ref 4.0–10.5)
nRBC: 0 % (ref 0.0–0.2)

## 2020-04-22 LAB — MAGNESIUM: Magnesium: 2.1 mg/dL (ref 1.7–2.4)

## 2020-04-22 LAB — T4, FREE: Free T4: 0.29 ng/dL — ABNORMAL LOW (ref 0.61–1.12)

## 2020-04-22 LAB — VITAMIN B12: Vitamin B-12: 645 pg/mL (ref 180–914)

## 2020-04-22 LAB — IRON AND TIBC
Iron: 108 ug/dL (ref 45–182)
Saturation Ratios: 36 % (ref 17.9–39.5)
TIBC: 298 ug/dL (ref 250–450)
UIBC: 190 ug/dL

## 2020-04-22 LAB — CORTISOL: Cortisol, Plasma: 14 ug/dL

## 2020-04-22 MED ORDER — LEVOTHYROXINE SODIUM 25 MCG PO TABS: 125.0000 ug | ORAL_TABLET | Freq: Every day | ORAL | Status: DC

## 2020-04-22 MED ORDER — CLOTRIMAZOLE 1 % EX CREA
TOPICAL_CREAM | Freq: Two times a day (BID) | CUTANEOUS | Status: DC
  Filled 2020-04-22: qty 15

## 2020-04-22 MED ORDER — HYDROCORTISONE NA SUCCINATE PF 100 MG IJ SOLR
50.0000 mg | Freq: Three times a day (TID) | INTRAMUSCULAR | Status: DC
  Administered 2020-04-22: 50 mg via INTRAVENOUS
  Filled 2020-04-22: qty 2

## 2020-04-22 MED ORDER — HYDROCORTISONE NA SUCCINATE PF 100 MG IJ SOLR: 100.0000 mg | Freq: Three times a day (TID) | INTRAMUSCULAR | Status: DC

## 2020-04-22 MED ORDER — LEVOTHYROXINE SODIUM 100 MCG/5ML IV SOLN
50.0000 ug | Freq: Every day | INTRAVENOUS | Status: AC
  Administered 2020-04-23: 50 ug via INTRAVENOUS
  Filled 2020-04-22: qty 5

## 2020-04-22 MED ORDER — POLYETHYLENE GLYCOL 3350 17 G PO PACK
17.0000 g | PACK | Freq: Two times a day (BID) | ORAL | Status: DC
  Administered 2020-04-22 – 2020-04-23 (×3): 17 g via ORAL
  Filled 2020-04-22 (×3): qty 1

## 2020-04-22 NOTE — NC FL2 (Signed)
[place] LEVEL OF CARE SCREENING TOOL     IDENTIFICATION  Patient Name: Ian Aguilar Birthdate: [birth year] Sex: male Admission Date (Current Location): 04/20/2020  [facility] and [place] Number:  [employer] and Address:  [facility] [address], [place]      Provider Number: [id number]  Attending Physician Name and Address:  Lavina Hamman, MD  Relative Name and Phone Number:       Current Level of Care: Hospital Recommended Level of Care: [place] Prior Approval Number:    Date Approved/Denied:   PASRR Number: [id number]  Discharge Plan: SNF    Current Diagnoses: Patient Active Problem List   Diagnosis Date Noted  . Severe hypothyroidism 04/22/2020  . Distention of biliary tract 04/21/2020  . AKI (acute kidney injury) ([place]) 04/21/2020  . Hypothyroidism 04/21/2020  . Abdominal distension 04/20/2020  . COPD exacerbation ([place]) 10/24/2019  . Acute encephalopathy 10/23/2019  . Dehydration 10/23/2019  . COPD with acute exacerbation ([place]) 10/23/2019  . Chronic hypoxemic respiratory failure ([place]) 10/23/2019  . Hypertensive urgency 10/23/2019  . Goals of care, counseling/discussion   . Palliative care by specialist   . DNR (do not resuscitate) discussion   . Malignant neoplasm of bronchus and lung ([place])   . Carcinoma, lung ([place]) 06/05/2017  . Hepatic cirrhosis ([place]) 04/06/2017  . History of colonic polyps 04/06/2017  . History of snoring 09/07/2015  . Fracture of ulnar styloid   . Benign essential HTN   . Faintness   . Thrombocytopenia ([place]) 09/03/2015  . Hyponatremia 09/03/2015  . Closed displaced intertrochanteric fracture of right femur ([place])   . Distal radius fracture, right 09/02/2015  . Fracture of right hip requiring operative repair ([place]) 09/01/2015  . Alcohol abuse 09/01/2015  . Tobacco abuse 09/01/2015  . COPD (chronic obstructive pulmonary disease) 09/01/2015  . S/P hip  replacement 01/15/2012  . Weakness of left leg 01/10/2012  . Difficulty in walking(719.7) 01/10/2012  . Pain in left hip 01/10/2012  . Acute blood loss anemia 12/21/2011  . Cirrhosis- imaging diagnosis 2013 09/08/2011  . Umbilical hernia 65/78/4696  . 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  . Hepatomegaly 06/14/2011  . Encounter for screening colonoscopy 06/14/2011  . JOINT EFFUSION, KNEE 02/10/2008  . KNEE PAIN 02/10/2008    Orientation RESPIRATION BLADDER Height & Weight     Self,Time,Situation,Place  O2 (see dc summary) Continent Weight: 187 lb 9.8 oz (85.1 kg) Height:  5\' 11"  (180.3 cm)  BEHAVIORAL SYMPTOMS/MOOD NEUROLOGICAL BOWEL NUTRITION STATUS      Continent Diet (see dc summary)  AMBULATORY STATUS COMMUNICATION OF NEEDS Skin   Extensive Assist Verbally Normal                       Personal Care Assistance Level of Assistance  Bathing,Feeding,Dressing Bathing Assistance: Limited assistance Feeding assistance: Independent Dressing Assistance: Limited assistance     Functional Limitations Info  Sight,Hearing,Speech Sight Info: Adequate Hearing Info: Adequate Speech Info: Adequate    SPECIAL CARE FACTORS FREQUENCY  PT (By licensed PT),OT (By licensed OT)     PT Frequency: 5x week OT Frequency: 3x week            Contractures Contractures Info: Not present    Additional Factors Info  Code Status,Allergies Code Status Info: DNR Allergies Info: NKA           Current Medications (04/22/2020):  This is the current hospital active medication list Current  Facility-Administered Medications  Medication Dose Route Frequency Provider Last Rate Last Admin  . acetaminophen (TYLENOL) tablet 650 mg  650 mg Oral Q6H PRN Zierle-Ghosh, Asia B, DO       Or  . acetaminophen (TYLENOL) suppository 650 mg  650 mg Rectal Q6H PRN Zierle-Ghosh, Asia B, DO      . Chlorhexidine Gluconate Cloth 2 % PADS 6 each  6 each Topical Daily Zierle-Ghosh, Asia B, DO   6 each at  04/21/20 1458  . clotrimazole (LOTRIMIN) 1 % cream   Topical BID Lavina Hamman, MD      . guaiFENesin [facility]) 12 hr tablet 600 mg  600 mg Oral PRN Zierle-Ghosh, Asia B, DO      . heparin injection 5,000 Units  5,000 Units Subcutaneous Q8H Zierle-Ghosh, Asia B, DO   5,000 Units at 04/22/20 0531  . [START ON 04/24/2020] levothyroxine (SYNTHROID) tablet 125 mcg  125 mcg Oral Q0600 Lavina Hamman, MD      . Derrill Memo ON 04/23/2020] levothyroxine (SYNTHROID, LEVOTHROID) injection 50 mcg  50 mcg Intravenous Daily Lavina Hamman, MD      . ondansetron [facility]) tablet 4 mg  4 mg Oral Q6H PRN Zierle-Ghosh, Asia B, DO       Or  . ondansetron (ZOFRAN) injection 4 mg  4 mg Intravenous Q6H PRN Zierle-Ghosh, Asia B, DO      . oxyCODONE (Oxy IR/ROXICODONE) immediate release tablet 5 mg  5 mg Oral Q4H PRN Zierle-Ghosh, Asia B, DO      . polyethylene glycol (MIRALAX / GLYCOLAX) packet 17 g  17 g Oral BID Lavina Hamman, MD      . senna-docusate (Senokot-S) tablet 1 tablet  1 tablet Oral BID Lavina Hamman, MD   1 tablet at 04/22/20 0850  . simethicone (MYLICON) chewable tablet 80 mg  80 mg Oral QID Rogene Houston, MD   80 mg at 04/22/20 0850  . umeclidinium bromide (INCRUSE ELLIPTA) 62.5 MCG/INH 1 puff  1 puff Inhalation Daily Zierle-Ghosh, Asia B, DO   1 puff at 04/22/20 0824     Discharge Medications: Please see discharge summary for a list of discharge medications.  Relevant Imaging Results:  Relevant Lab Results:   Additional Information SSN: [id number]  Shade Flood, LCSW

## 2020-04-22 NOTE — Progress Notes (Signed)
Subjective:  Feels better. Passed a lot of stool last night. Abdomen less distended. Tolerating diet.   Objective: Vital signs in last 24 hours: Temp:  [97.4 F (36.3 C)-97.8 F (36.6 C)] 97.8 F (36.6 C) (02/23 2047) Pulse Rate:  [57-61] 59 (02/24 0546) Resp:  [16] 16 (02/23 1456) BP: (133-152)/(77-83) 133/77 (02/24 0546) SpO2:  [94 %-97 %] 97 % (02/24 0824) Weight:  [85.1 kg] 85.1 kg (02/24 0500) Last BM Date: 04/21/20 General:   Alert,  Well-developed, well-nourished, pleasant and cooperative in NAD Head:  Normocephalic and atraumatic. Eyes:  Sclera clear, no icterus.  Abdomen:  Soft, slight distention. nontender. Normal bowel sounds, without guarding, and without rebound.   Extremities:  Without clubbing, deformity or edema. Neurologic:  Alert and  oriented x4;  grossly normal neurologically. No asterixis. Skin:  Intact without significant lesions or rashes. Psych:  Alert and cooperative. Normal mood and affect.  Intake/Output from previous day: 02/23 0701 - 02/24 0700 In: 100 [P.O.:50; I.V.:50] Out: 740 [Urine:740] Intake/Output this shift: No intake/output data recorded.  Lab Results: CBC Recent Labs    04/20/20 1615 04/21/20 0423 04/22/20 0453  WBC 3.6* 4.3 4.5  HGB 11.5* 11.4* 11.5*  HCT 34.4* 35.4* 35.5*  MCV 94.8 95.2 94.7  PLT 163 155 157   BMET Recent Labs    04/20/20 1615 04/21/20 0423 04/22/20 0453  NA 130* 134* 130*  K 4.1 3.9 3.9  CL 94* 92* 93*  CO2 30 31 27   GLUCOSE 89 88 118*  BUN 15 14 16   CREATININE 1.40* 1.34* 1.37*  CALCIUM 8.9 9.2 8.8*   LFTs Recent Labs    04/20/20 1615 04/21/20 0423 04/22/20 0453  BILITOT 0.9 1.2 1.0  ALKPHOS 90 90 84  AST 146* 142* 123*  ALT 57* 55* 49*  PROT 6.9 6.8 6.5  ALBUMIN 3.9 3.9 3.7   Recent Labs    04/20/20 1615  LIPASE 30   PT/INR Recent Labs    04/20/20 1615 04/22/20 0453  LABPROT 13.4 13.4  INR 1.1 1.1      Imaging Studies: US Abdomen Complete  Result Date:  04/20/2020 CLINICAL DATA:  Cirrhosis and abdominal distension. EXAM: ABDOMEN ULTRASOUND COMPLETE COMPARISON:  None. FINDINGS: Gallbladder: No gallstones or wall thickening visualized (2.4 mm). No sonographic Murphy sign noted by sonographer. Common bile duct: Diameter: 3.7 mm Liver: The echotexture of the liver is coarse and nodular in appearance. No focal lesion identified. Diffusely increased echogenicity of the liver parenchyma is seen. Portal vein is patent on color Doppler imaging with normal direction of blood flow towards the liver. IVC: No abnormality visualized. Pancreas: Visualized portion unremarkable. Spleen: Size (7.8 cm) and appearance within normal limits. Right Kidney: Length: 10.2 cm. Echogenicity within normal limits. No mass or hydronephrosis visualized. Left Kidney: Length: 10.6 cm. Echogenicity within normal limits. No mass or hydronephrosis visualized. Abdominal aorta: No aneurysm visualized (1.8 cm in AP diameter). Other findings: A mild amount of free fluid is seen within the right lower quadrant. IMPRESSION: 1. Cirrhotic liver. 2. Small amount of free fluid. Electronically Signed   By: Virgina Norfolk M.D.   On: 04/20/2020 19:16   CT ABDOMEN PELVIS W CONTRAST  Result Date: 04/20/2020 CLINICAL DATA:  Abdominal pain and bloating with nausea for 4 days. EXAM: CT ABDOMEN AND PELVIS WITH CONTRAST TECHNIQUE: Multidetector CT imaging of the abdomen and pelvis was performed using the standard protocol following bolus administration of intravenous contrast. CONTRAST:  124mL OMNIPAQUE IOHEXOL 300 MG/ML  SOLN COMPARISON:  Multiple prior studies most recent from October of 2021 FINDINGS: Lower chest: Minimal basilar atelectasis. Hepatobiliary: Mild peripheral biliary duct distension in the LEFT hepatic lobe lateral segment. Periportal hypoattenuation unchanged dating back to October of 2019. No focal, suspicious hepatic lesion on venous phase assessment. However, on delayed phase imaging there is  suggestion of added density within the central portion of this area raising the question of indolent biliary neoplasm. (Image 96 of series 7) perhaps measuring as large as 1 x 0.8 cm hepatic veins are patent. Gallbladder is nondistended. Common bile duct is nondilated. Pancreas: Pancreas with signs of mild atrophy. Hypoattenuating lesion in the genu of the pancreas measures 8 mm unchanged since 2019 and not associated with main duct distension or inflammation. Spleen: Spleen normal size with small amount of perisplenic ascites. Adrenals/Urinary Tract: Adrenal glands are normal. Symmetric renal enhancement. No hydronephrosis. Smooth contour the urinary bladder. Stomach/Bowel: Mild gastric thickening is suggested versus under distension with little change. No small bowel dilation. No significant perienteric stranding. Mild generalized mesenteric edema in the setting of portal hypertension now with ascites. Appendix not clearly visualized, perhaps partially visualized along the RIGHT flank. No specific secondary signs of acute appendicitis Vascular/Lymphatic: Calcified and noncalcified atheromatous plaque in the abdominal aorta. There is no gastrohepatic or hepatoduodenal ligament lymphadenopathy. No retroperitoneal or mesenteric lymphadenopathy. Smooth contour the IVC. No pelvic sidewall lymphadenopathy. Streak artifact from LEFT hip arthroplasty limits assessment. Changes of RIGHT hip ORIF also limiting assessment. Reproductive: Prostate unremarkable by CT. Other: Small volume ascites is new compared to previous imaging. No free air. Small fat containing umbilical hernia. Musculoskeletal: Spinal degenerative changes. No acute or destructive bone process. Post RIGHT femoral ORIF. Post LEFT total hip arthroplasty. IMPRESSION: 1. Cirrhosis with signs of ascites, new from prior imaging. 2. Question of gastric thickening may represent gastritis or portal gastropathy. 3. Peribiliary hypoattenuation perhaps a combination of  biliary cysts and biliary duct distension. Question of lesion within the biliary ducts in hepatic subsegment III where there is variable attenuation within the ducts best seen on image 36 of series 5. Stability argues against aggressive process but indolent biliary neoplasm or small stones could have this appearance. When the patient is clinically stable and able to follow directions and hold their breath (preferably as an outpatient) further evaluation with dedicated abdominal MRI/MRCP should be considered. 4. Hypoattenuating lesion in the genu of the pancreas measures 8 mm and unchanged since 2019 and not associated with main duct distension or inflammation. 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 Consider follow-up in 1 year with MRI. 5. Aortic atherosclerosis. Aortic Atherosclerosis (ICD10-I70.0). Electronically Signed   By: Zetta Bills M.D.   On: 04/20/2020 21:24   MR 3D Recon At Scanner  Result Date: 04/21/2020 CLINICAL DATA:  Cirrhosis. Pancreatic pseudocysts. No duct dilatation. Abdominal distension EXAM: MRI ABDOMEN WITHOUT AND WITH CONTRAST (INCLUDING MRCP) TECHNIQUE: Multiplanar multisequence MR imaging of the abdomen was performed both before and after the administration of intravenous contrast. Heavily T2-weighted images of the biliary and pancreatic ducts were obtained, and three-dimensional MRCP images were rendered by post processing. CONTRAST:  24mL GADAVIST GADOBUTROL 1 MMOL/ML IV SOLN COMPARISON:  CT 11/18/2020, MRI 07/10/2011 FINDINGS: Lower chest:  Lung bases are clear. Hepatobiliary: There is chronic intrahepatic biliary duct dilatation within LEFT hepatic lobe with ductal ectasia progressed from MRI 2013. There is no focal obstructing lesion. There is no filling defect or enhancing lesion within the LEFT hepatic duct correspond to the abnormal finding  on CT exam 1 day prior. The liver has a lobular contour. The caudate lobe is enlarged. Small  volume ascites. Postcontrast enhanced imaging demonstrates no focal early arterial enhancing lesion. Portal veins are patent. The common bile duct is normal caliber. Mild duct dilatation in the RIGHT hepatic lobe is also noted (image 16/series 2) Pancreas: cystic lesion in the genu of the pancreas is unilocular without enhancement or nodularity and measures 6 mm on image 21/26. No communication with the pancreatic duct. This lesion is slightly increased in size from 5 mm on MRI 2013. No pancreatic atrophy. Spleen: Multiple a punctate foci of loss of signal intensity on T1 weighted imaging within the spleen most consistent with hemosiderin rich [employer] bodies. No splenomegaly. Splenic vein is patent. Adrenals/urinary tract: Adrenal glands and kidneys are normal. Stomach/Bowel: Stomach and limited of the small bowel is unremarkable Vascular/Lymphatic: Abdominal aortic normal caliber. No retroperitoneal periportal lymphadenopathy. Musculoskeletal: No aggressive osseous lesion IMPRESSION: 1. No worrisome lesion within the dilated LEFT hepatic ducts to corresponds to the abnormal finding described on comparison CT. 2. Chronic ductal ectasia and dilatation in the LEFT hepatic lobe progressed from 2013. 3. No extrahepatic biliary duct dilatation. 4. Cirrhosis without evidence enhancing  hepatoma. 5. Small volume ascites. 6. Benign appearing cysts within the genu of the pancreas mildly increased in size from 2013 most consistent benign etiology. 7. 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 Electronically Signed   By: Suzy Bouchard M.D.   On: 04/21/2020 16:22   DG Hand Complete Right  Result Date: 04/20/2020 CLINICAL DATA:  Right hand pain. Fall last night going into closet. Pain, swelling and bruising about the fifth metacarpal. EXAM: RIGHT HAND - COMPLETE 3+ VIEW COMPARISON:  None. FINDINGS: Minimally displaced and angulated distal fifth metacarpal fracture. There is  no intra-articular extension. No other fracture of the hand. The bones are diffusely under mineralized. There is mild osteoarthritis most prominently involving the thumb carpal metacarpal. Remote healed distal radius fracture. Soft tissue edema about the ulnar aspect of the hand. IMPRESSION: Minimally displaced and angulated distal fifth metacarpal fracture. No intra-articular extension. Electronically Signed   By: Keith Rake M.D.   On: 04/20/2020 15:45   MR ABDOMEN MRCP W WO CONTAST  Result Date: 04/21/2020 CLINICAL DATA:  Cirrhosis. Pancreatic pseudocysts. No duct dilatation. Abdominal distension EXAM: MRI ABDOMEN WITHOUT AND WITH CONTRAST (INCLUDING MRCP) TECHNIQUE: Multiplanar multisequence MR imaging of the abdomen was performed both before and after the administration of intravenous contrast. Heavily T2-weighted images of the biliary and pancreatic ducts were obtained, and three-dimensional MRCP images were rendered by post processing. CONTRAST:  91mL GADAVIST GADOBUTROL 1 MMOL/ML IV SOLN COMPARISON:  CT 11/18/2020, MRI 07/10/2011 FINDINGS: Lower chest:  Lung bases are clear. Hepatobiliary: There is chronic intrahepatic biliary duct dilatation within LEFT hepatic lobe with ductal ectasia progressed from MRI 2013. There is no focal obstructing lesion. There is no filling defect or enhancing lesion within the LEFT hepatic duct correspond to the abnormal finding on CT exam 1 day prior. The liver has a lobular contour. The caudate lobe is enlarged. Small volume ascites. Postcontrast enhanced imaging demonstrates no focal early arterial enhancing lesion. Portal veins are patent. The common bile duct is normal caliber. Mild duct dilatation in the RIGHT hepatic lobe is also noted (image 16/series 2) Pancreas: cystic lesion in the genu of the pancreas is unilocular without enhancement or nodularity and measures 6 mm on image 21/26. No communication with the pancreatic  duct. This lesion is slightly increased  in size from 5 mm on MRI 2013. No pancreatic atrophy. Spleen: Multiple a punctate foci of loss of signal intensity on T1 weighted imaging within the spleen most consistent with hemosiderin rich [employer] bodies. No splenomegaly. Splenic vein is patent. Adrenals/urinary tract: Adrenal glands and kidneys are normal. Stomach/Bowel: Stomach and limited of the small bowel is unremarkable Vascular/Lymphatic: Abdominal aortic normal caliber. No retroperitoneal periportal lymphadenopathy. Musculoskeletal: No aggressive osseous lesion IMPRESSION: 1. No worrisome lesion within the dilated LEFT hepatic ducts to corresponds to the abnormal finding described on comparison CT. 2. Chronic ductal ectasia and dilatation in the LEFT hepatic lobe progressed from 2013. 3. No extrahepatic biliary duct dilatation. 4. Cirrhosis without evidence enhancing  hepatoma. 5. Small volume ascites. 6. Benign appearing cysts within the genu of the pancreas mildly increased in size from 2013 most consistent benign etiology. 7. 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 Electronically Signed   By: Suzy Bouchard M.D.   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  CT this admission concerning for possible indolent biliary neoplasm with recommended MRI/MRCP.  Abdominal distention: Appears to be related to constipation and flatulence.  He had minimal ascites on CT.  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 Patient reports large BM yesterday evening and has had significant improvement in his abdominal distention.  Cirrhosis: Presumed alcohol related. MELD Na of 11.  No known history of hepatic encephalopathy, serum ammonia level normal this admission.  Mildly elevated transaminases may be related to hypothyroid  state. Patient denies etoh use.  Viral markers negative for hepatitis A, B, C. AFP tumor marker pending.  Dilated left hepatic ducts: Dilated left hepatic ducts dating back to 2013, MRCP.  Current study reveals increase in the caliber of left hepatic ducts but there is no mass seen.  Given clinical course, etiology would appear to be benign unless there is a new separate etiology.  Doubt abnormality related to lung carcinoma.  Depending on clinical course, he may require cholangioscopy.  Plan: 1. MiraLAX 17 g BID.  Lactulose may increase abdominal distention/flatulence. 2. Thyroid replacement per attending. 3. Follow-up AFP 4. Closer interval follow up as outpatient.  5. Will follow peripherally.   Laureen Ochs. Bernarda Caffey [facility] [phone] 2/24/202211:50 AM     LOS: 2 days

## 2020-04-22 NOTE — Evaluation (Signed)
Occupational Therapy Evaluation Patient Details Name: Ian Aguilar MRN: [id number] DOB: 1940-11-22 Today's Date: 04/22/2020    History of Present Illness Ian Aguilar  is a 80 y.o. male, with history of upper lobe right lung nodule, hypertension, hyperlipidemia, alcohol abuse-quit 4 years ago, COPD, cirrhosis, anxiety presents to the ED with a chief complaint of abdominal distention.  Reports that he has had gradual buildup of abdominal distention over the last 4 weeks.  He has an associated 35 pound weight gain.  He has had an associated gradual increase in fatigue as well, but then this weekend he slept more than 24 hours in a row.  This was concerning for him.  He reports that up until that sleeping more than 24 hours in a row, he was eating and drinking normally.  His bowel movements have been normal.  He has felt nauseous but has not thrown up.  He denies any belly pain.  He denies any changes in his urine.  Patient still has his gallbladder.  At first he reports that he has not had liver problems but then reports that he has cirrhosis so maybe he does have liver problems.  Patient denies any fevers, change in his shortness of breath or cough, chest pain, palpitations.  Patient reports that the two things that mostly triggered him to come into the ER, the increased fatigue, and the difficulty with moving around with his belly so distended.   Clinical Impression   Pt agreeable to OT evaluation. Pt able to demonstrate bed mobility with modified independence. Pt able to complete sit to stand with minimal assist using RW. Minimal assist needed to maintain balance in static standing due to posterior lean. Pt able to complete feeding and oral care with SPV at EOB. Pt reports that his wife is unable to assist at home and that he has a son that works and is only available periodically. Pt will benefit from continued OT service to address strength and endurance to increase functional ADL levels prior to  d/c to the below location.     Follow Up Recommendations  SNF;Supervision - Intermittent;Other (comment) (Pt reported desire to go to an assisted living facility.)    Equipment Recommendations  None recommended by OT           Precautions / Restrictions Precautions Precautions: Fall Restrictions Weight Bearing Restrictions: No      Mobility Bed Mobility Overal bed mobility: Modified Independent             General bed mobility comments: slow and labored    Transfers Overall transfer level: Needs assistance Equipment used: Standard walker Transfers: Sit to/from Stand Sit to Stand: Min assist         General transfer comment: sit to stand from EOB using RW    Balance Overall balance assessment: Needs assistance Sitting-balance support: No upper extremity supported;Feet supported Sitting balance-Leahy Scale: Good Sitting balance - Comments: at EOB   Standing balance support:  (Support posterior torso) Standing balance-Leahy Scale: Fair Standing balance comment: Minimal assist due to posterior lean.                           ADL either performed or assessed with clinical judgement   ADL Overall ADL's : Needs assistance/impaired Eating/Feeding: Sitting;Supervision/ safety   Grooming: Oral care;Sitting;Supervision/safety  General ADL Comments: slow, labored movement at times     Vision Baseline Vision/History: Wears glasses                  Pertinent Vitals/Pain Pain Assessment: No/denies pain     Hand Dominance Right   Extremity/Trunk Assessment Upper Extremity Assessment Upper Extremity Assessment: Generalized weakness   Lower Extremity Assessment Lower Extremity Assessment: Defer to PT evaluation   Cervical / Trunk Assessment Cervical / Trunk Assessment: Normal   Communication Communication Communication: No difficulties   Cognition Arousal/Alertness: Awake/alert Behavior  During Therapy: WFL for tasks assessed/performed Overall Cognitive Status: Within Functional Limits for tasks assessed                                                      Home Living Family/patient expects to be discharged to:: Private residence Living Arrangements: Spouse/significant other Available Help at Discharge: Other (Comment) (Reported wife is not capable of assisting.) Type of Home: House Home Access: Stairs to enter [employer] of Steps: 7 (taken from doc review) Entrance Stairs-Rails: None Home Layout: Multi-level Alternate Level Stairs-Number of Steps: 8 and 9 (Must go to third level for full bathroom. Half bath on bottom level.) Alternate Level Stairs-Rails: None Bathroom Shower/Tub: Teacher, early years/pre: Handicapped height Bathroom Accessibility: Yes How Accessible: Accessible via wheelchair;Accessible via walker Home Equipment: [place] - 2 wheels;Cane - single point;Shower seat;Bedside commode;Other (comment) (quad cane)          Prior Functioning/Environment Level of Independence: Independent with assistive device(s)        Comments: household ambulator using SPC or RW        OT Problem List: Decreased strength;Decreased activity tolerance;Impaired balance (sitting and/or standing)      OT Treatment/Interventions: Self-care/ADL training;Therapeutic exercise;DME and/or AE instruction;Therapeutic activities;Patient/family education;Balance training    OT Goals(Current goals can be found in the care plan section) Acute Rehab OT Goals Patient Stated Goal: Get to assisted living facility. OT Goal Formulation: With patient Time For Goal Achievement: 05/06/20 Potential to Achieve Goals: Good  OT Frequency: Min 2X/week   Barriers to D/C: Decreased caregiver support  Pt lives with wife but wife is getting back surgery in a couple days and cannot provide physical assist.                                       End of Session Equipment Utilized During Treatment: Surveyor, mining Communication: Other (comment) (Locating denture adhesive for patient.)  Activity Tolerance: Patient tolerated treatment well Patient left: in bed;with call bell/phone within reach  OT Visit Diagnosis: Unsteadiness on feet (R26.81);Muscle weakness (generalized) (M62.81);History of falling (Z91.81);Repeated falls (R29.6)                Time: 7867-6720 OT Time Calculation (min): 23 min Charges:  OT General Charges $OT Visit: 1 Visit OT Evaluation $OT Eval Low Complexity: 1 Low  Jannae Fagerstrom OT, MOT   Larey Seat 04/22/2020, 10:02 AM

## 2020-04-22 NOTE — Evaluation (Signed)
Physical Therapy Evaluation Patient Details Name: Ian Aguilar MRN: [id number] DOB: May 23, 1940 Today's Date: 04/22/2020   History of Present Illness  Ian Aguilar  is a 80 y.o. male, with history of upper lobe right lung nodule, hypertension, hyperlipidemia, alcohol abuse-quit 4 years ago, COPD, cirrhosis, anxiety presents to the ED with a chief complaint of abdominal distention.  Reports that he has had gradual buildup of abdominal distention over the last 4 weeks.  He has an associated 35 pound weight gain.  He has had an associated gradual increase in fatigue as well, but then this weekend he slept more than 24 hours in a row.  This was concerning for him.  He reports that up until that sleeping more than 24 hours in a row, he was eating and drinking normally.  His bowel movements have been normal.  He has felt nauseous but has not thrown up.  He denies any belly pain.  He denies any changes in his urine.  Patient still has his gallbladder.  At first he reports that he has not had liver problems but then reports that he has cirrhosis so maybe he does have liver problems.  Patient denies any fevers, change in his shortness of breath or cough, chest pain, palpitations.  Patient reports that the two things that mostly triggered him to come into the ER, the increased fatigue, and the difficulty with moving around with his belly so distended.    Clinical Impression  Patient very unsteady and at high risk for falls when attempting to take steps without AD, slight improvement using RW, but has tendency to lean backwards and limited to a few steps at bedside due to fatigue and SpO2 dropping with exertion.  Patient put on room air prior to attempting functional activity and dropped from 91% to 86% seated at bedside, put back on 2 LPM for functional activity.  Patient tolerated sitting up in chair after therapy - nursing staff notified.  Patient will benefit from continued physical therapy in hospital and  recommended venue below to increase strength, balance, endurance for safe ADLs and gait.     Follow Up Recommendations SNF    Equipment Recommendations  None recommended by PT    Recommendations for Other Services       Precautions / Restrictions Precautions Precautions: Fall Restrictions Weight Bearing Restrictions: No      Mobility  Bed Mobility Overal bed mobility: Modified Independent             General bed mobility comments: slow and labored    Transfers Overall transfer level: Needs assistance Equipment used: None;Rolling walker (2 wheeled) Transfers: Sit to/from [facility] Sit to Stand: Min assist;Mod assist Stand pivot transfers: Mod assist       General transfer comment: unable to maintain standing balance without AD, required use of RW, tends to lean backwards due to weakness  Ambulation/Gait Ambulation/Gait assistance: Mod assist Gait Distance (Feet): 8 Feet Assistive device: Rolling walker (2 wheeled) Gait Pattern/deviations: Decreased step length - right;Decreased step length - left;Decreased stride length Gait velocity: decreased   General Gait Details: limited to taking steps at bedside demonstration slow unsteady movement with frequent leaning backs, fatigues easily, on 2 LPM secondary to desaturating to 86% on room air  Stairs            Wheelchair Mobility    Modified Rankin (Stroke Patients Only)       Balance Overall balance assessment: Needs assistance Sitting-balance support: Feet supported;No upper extremity  supported Sitting balance-Leahy Scale: Good Sitting balance - Comments: at EOB   Standing balance support: During functional activity;No upper extremity supported Standing balance-Leahy Scale: Poor Standing balance comment: fair/poor using RW                             Pertinent Vitals/Pain Pain Assessment: No/denies pain    Home Living Family/patient expects to be discharged to::  Private residence Living Arrangements: Spouse/significant other Available Help at Discharge: Family;Other (Comment);Available 24 hours/day (spouse unable to help per patient) Type of Home: House Home Access: Stairs to enter Entrance Stairs-Rails: None Entrance Stairs-Number of Steps: 2 Home Layout: Multi-level Home Equipment: Walker - 2 wheels;Cane - single point;Shower seat;Bedside commode      Prior Function Level of Independence: Independent         Comments: household and short distanced community ambulator using SPC PRN, drives, "per patient"     Hand Dominance   Dominant Hand: Right    Extremity/Trunk Assessment   Upper Extremity Assessment Upper Extremity Assessment: Defer to OT evaluation    Lower Extremity Assessment Lower Extremity Assessment: Generalized weakness    Cervical / Trunk Assessment Cervical / Trunk Assessment: Normal  Communication   Communication: No difficulties  Cognition Arousal/Alertness: Awake/alert Behavior During Therapy: WFL for tasks assessed/performed Overall Cognitive Status: Within Functional Limits for tasks assessed                                        General Comments      Exercises     Assessment/Plan    PT Assessment Patient needs continued PT services  PT Problem List Decreased strength;Decreased activity tolerance;Decreased balance;Decreased mobility       PT Treatment Interventions DME instruction;Gait training;Stair training;Functional mobility training;Therapeutic activities;Therapeutic exercise;Balance training;Patient/family education    PT Goals (Current goals can be found in the Care Plan section)  Acute Rehab PT Goals Patient Stated Goal: return home after rehab PT Goal Formulation: With patient Time For Goal Achievement: 05/06/20 Potential to Achieve Goals: Good    Frequency Min 3X/week   Barriers to discharge        Co-evaluation               AM-PAC PT "6 Clicks"  Mobility  Outcome Measure Help needed turning from your back to your side while in a flat bed without using bedrails?: None Help needed moving from lying on your back to sitting on the side of a flat bed without using bedrails?: A Little Help needed moving to and from a bed to a chair (including a wheelchair)?: A Lot Help needed standing up from a chair using your arms (e.g., wheelchair or bedside chair)?: A Lot Help needed to walk in hospital room?: A Lot Help needed climbing 3-5 steps with a railing? : A Lot 6 Click Score: 15    End of Session Equipment Utilized During Treatment: Oxygen Activity Tolerance: Patient tolerated treatment well;Patient limited by fatigue Patient left: in chair;with call bell/phone within reach Nurse Communication: Mobility status PT Visit Diagnosis: Unsteadiness on feet (R26.81);Other abnormalities of gait and mobility (R26.89);Muscle weakness (generalized) (M62.81)    Time: 0865-7846 PT Time Calculation (min) (ACUTE ONLY): 33 min   Charges:   PT Evaluation $PT Eval Moderate Complexity: 1 Mod PT Treatments $Therapeutic Activity: 23-37 mins        12:27 PM,  04/22/20 Lonell Grandchild, MPT Physical Therapist with [facility] 336 [phone] office [phone] mobile phone

## 2020-04-22 NOTE — Progress Notes (Signed)
[facility] Progress Note  Patient: Ian Aguilar    MWU:[id number]  DOA: 04/20/2020     Date of Service: the patient was seen and examined on 04/22/2020  Brief hospital course: Possible history of HTN, HLD, liver cirrhosis, COPD, osteoarthritis, history of alcohol abuse quit 4 years ago with cirrhosis, right lung cancer, stage II on observation.  Presents with complaints of abdominal distention and weight gain ongoing for last 4 weeks.  Along with encephalopathy, fatigue increase.  Family also reported progressive worsening mental status with confusion.  Had a fall prior to admission with acute fracture of his right fifth metatarsal. Found to have severe constipation, hyponatremia, elevated LFT with dilated CBD and abdominal distention secondary to severe hypothyroidism.  Reportedly patient has not been prescribed his Synthroid at least since Thanksgiving. Currently plan is aggressive treatment for hypothyroidism.  Assessment and Plan: 1.  Severe hypothyroidism without any, Acute metabolic encephalopathy Sinus bradycardia TSH 219 on admission.  Trending down to 230. Patient supposed to be on 100 MCG Synthroid but has not been prescribed this medication at least since Thanksgiving. Free T4 low. Currently being supplemented with IV Synthroid 50 MCG. Patient also received IV Decadron and Solu-Cortef although cortisol level is adequate therefore no indication for continue steroids. Able to maintain temperature, able to eat oral diet and not comatose therefore we will transition to p.o. Synthroid after 3 days of IV Synthroid.  2.  Hyponatremia Secondary to hypothyroidism. Continue to replace.   3.  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 Abdominal distention Primary reason for presentation is abdominal distention. This is likely secondary to constipation and flatulence as well as bladder distention. Symptoms appears to be stable for now. INR normal  ammonia normal.  No asterixis.  Patient not drinking alcohol for last 4 years. LFTs were mildly elevated.  CBD was dilated even back in 2013.  MRCP this admission shows dilated CBD without any evidence of mass or tumor. Outpatient follow-up recommended.  No ERCP right now.  Tolerating oral diet.  Feeding constipation with aggressive bowel regimen.  4.  Right wrist fracture Seen outpatient by PCP. Consider orthopedic.  Currently pain control and splinting recommended.  5.  Acute kidney injury Secondary to poor p.o. intake. Currently improving. Monitor.  Baseline serum creatinine 0.80.7. On admission serum creatinine 1.4.  6.  Essential hypertension Blood pressure improving. Continue to monitor for now.  7.  Deconditioning. PT OT recommends SNF.  Patient was able to walk only 8 feet. Ataxic on examination. Secondary to hypothyroidism. Once therapy is restored gradually patient's strength and ambulation should improve over weeks. Patient will benefit from aggressive therapy at SNF during the timeline for rapid recovery.  Also will require close observation to ensure that the patient is receiving his Synthroid and other medications.  Diet: Cardiac diet DVT Prophylaxis:   heparin injection 5,000 Units Start: 04/20/20 2345 SCDs Start: 04/20/20 2335    Advance goals of care discussion: DNR  Family Communication: no family was present at bedside, at the time of interview.   Disposition:  Status is: Inpatient  Remains inpatient appropriate because:IV treatments appropriate due to intensity of illness or inability to take PO   Dispo:  Patient From: Home  Planned Disposition: [place]  Medically stable for discharge: No     Subjective: No nausea no vomiting but no fever no chills.  Reports constipation.  Not passing gas.  No shortness of breath.  Has some cough.  Physical Exam:  General: Appear in mild distress, no Rash; Oral Mucosa Clear, moist. no Abnormal  Neck Mass Or lumps, Conjunctiva normal  Cardiovascular: S1 and S2 Present, no Murmur, Respiratory: good respiratory effort, Bilateral Air entry present and faint Crackles, Occasional  wheezes Abdomen: Bowel Sound present, Soft and no tenderness Extremities: trace Pedal edema Neurology: alert and oriented to time, place, and person affect appropriate. no asterixis, no new focal deficit Gait not checked due to patient safety concerns  Vitals:   04/21/20 2047 04/22/20 0500 04/22/20 0546 04/22/20 0824  BP: (!) 152/78  133/77   Pulse: 61  (!) 59   Resp:      Temp: 97.8 F (36.6 C)     TempSrc: Oral     SpO2: 94%  97% 97%  Weight:  85.1 kg    Height:        Intake/Output Summary (Last 24 hours) at 04/22/2020 1305 Last data filed at 04/22/2020 1100 Gross per 24 hour  Intake 100 ml  Output 450 ml  Net -350 ml   Filed Weights   04/20/20 1526 04/21/20 0849 04/22/20 0500  Weight: 88.5 kg 86.7 kg 85.1 kg    Data Reviewed: I have personally reviewed and interpreted daily labs, tele strips, imaging. I reviewed all nursing notes, pharmacy notes, vitals, pertinent old records I have discussed plan of care as described above with RN and patient/family.  CBC: Recent Labs  Lab 04/20/20 1615 04/21/20 0423 04/22/20 0453  WBC 3.6* 4.3 4.5  NEUTROABS 2.3  --   --   HGB 11.5* 11.4* 11.5*  HCT 34.4* 35.4* 35.5*  MCV 94.8 95.2 94.7  PLT 163 155 209   Basic Metabolic Panel: Recent Labs  Lab 04/20/20 1615 04/21/20 0423 04/22/20 0453  NA 130* 134* 130*  K 4.1 3.9 3.9  CL 94* 92* 93*  CO2 30 31 27   GLUCOSE 89 88 118*  BUN 15 14 16   CREATININE 1.40* 1.34* 1.37*  CALCIUM 8.9 9.2 8.8*  MG  --   --  2.1    Studies: MR 3D Recon At Scanner  Result Date: 04/21/2020 CLINICAL DATA:  Cirrhosis. Pancreatic pseudocysts. No duct dilatation. Abdominal distension EXAM: MRI ABDOMEN WITHOUT AND WITH CONTRAST (INCLUDING MRCP) TECHNIQUE: Multiplanar multisequence MR imaging of the abdomen was  performed both before and after the administration of intravenous contrast. Heavily T2-weighted images of the biliary and pancreatic ducts were obtained, and three-dimensional MRCP images were rendered by post processing. CONTRAST:  37mL GADAVIST GADOBUTROL 1 MMOL/ML IV SOLN COMPARISON:  CT 11/18/2020, MRI 07/10/2011 FINDINGS: Lower chest:  Lung bases are clear. Hepatobiliary: There is chronic intrahepatic biliary duct dilatation within LEFT hepatic lobe with ductal ectasia progressed from MRI 2013. There is no focal obstructing lesion. There is no filling defect or enhancing lesion within the LEFT hepatic duct correspond to the abnormal finding on CT exam 1 day prior. The liver has a lobular contour. The caudate lobe is enlarged. Small volume ascites. Postcontrast enhanced imaging demonstrates no focal early arterial enhancing lesion. Portal veins are patent. The common bile duct is normal caliber. Mild duct dilatation in the RIGHT hepatic lobe is also noted (image 16/series 2) Pancreas: cystic lesion in the genu of the pancreas is unilocular without enhancement or nodularity and measures 6 mm on image 21/26. No communication with the pancreatic duct. This lesion is slightly increased in size from 5 mm on MRI 2013. No pancreatic atrophy. Spleen: Multiple a punctate foci of loss of signal intensity on T1 weighted imaging  within the spleen most consistent with hemosiderin rich [employer] bodies. No splenomegaly. Splenic vein is patent. Adrenals/urinary tract: Adrenal glands and kidneys are normal. Stomach/Bowel: Stomach and limited of the small bowel is unremarkable Vascular/Lymphatic: Abdominal aortic normal caliber. No retroperitoneal periportal lymphadenopathy. Musculoskeletal: No aggressive osseous lesion IMPRESSION: 1. No worrisome lesion within the dilated LEFT hepatic ducts to corresponds to the abnormal finding described on comparison CT. 2. Chronic ductal ectasia and dilatation in the LEFT hepatic lobe  progressed from 2013. 3. No extrahepatic biliary duct dilatation. 4. Cirrhosis without evidence enhancing  hepatoma. 5. Small volume ascites. 6. Benign appearing cysts within the genu of the pancreas mildly increased in size from 2013 most consistent benign etiology. 7. 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 Electronically Signed   By: Suzy Bouchard M.D.   On: 04/21/2020 16:22   MR ABDOMEN MRCP W WO CONTAST  Result Date: 04/21/2020 CLINICAL DATA:  Cirrhosis. Pancreatic pseudocysts. No duct dilatation. Abdominal distension EXAM: MRI ABDOMEN WITHOUT AND WITH CONTRAST (INCLUDING MRCP) TECHNIQUE: Multiplanar multisequence MR imaging of the abdomen was performed both before and after the administration of intravenous contrast. Heavily T2-weighted images of the biliary and pancreatic ducts were obtained, and three-dimensional MRCP images were rendered by post processing. CONTRAST:  26mL GADAVIST GADOBUTROL 1 MMOL/ML IV SOLN COMPARISON:  CT 11/18/2020, MRI 07/10/2011 FINDINGS: Lower chest:  Lung bases are clear. Hepatobiliary: There is chronic intrahepatic biliary duct dilatation within LEFT hepatic lobe with ductal ectasia progressed from MRI 2013. There is no focal obstructing lesion. There is no filling defect or enhancing lesion within the LEFT hepatic duct correspond to the abnormal finding on CT exam 1 day prior. The liver has a lobular contour. The caudate lobe is enlarged. Small volume ascites. Postcontrast enhanced imaging demonstrates no focal early arterial enhancing lesion. Portal veins are patent. The common bile duct is normal caliber. Mild duct dilatation in the RIGHT hepatic lobe is also noted (image 16/series 2) Pancreas: cystic lesion in the genu of the pancreas is unilocular without enhancement or nodularity and measures 6 mm on image 21/26. No communication with the pancreatic duct. This lesion is slightly increased in size from 5 mm on MRI 2013. No  pancreatic atrophy. Spleen: Multiple a punctate foci of loss of signal intensity on T1 weighted imaging within the spleen most consistent with hemosiderin rich [employer] bodies. No splenomegaly. Splenic vein is patent. Adrenals/urinary tract: Adrenal glands and kidneys are normal. Stomach/Bowel: Stomach and limited of the small bowel is unremarkable Vascular/Lymphatic: Abdominal aortic normal caliber. No retroperitoneal periportal lymphadenopathy. Musculoskeletal: No aggressive osseous lesion IMPRESSION: 1. No worrisome lesion within the dilated LEFT hepatic ducts to corresponds to the abnormal finding described on comparison CT. 2. Chronic ductal ectasia and dilatation in the LEFT hepatic lobe progressed from 2013. 3. No extrahepatic biliary duct dilatation. 4. Cirrhosis without evidence enhancing  hepatoma. 5. Small volume ascites. 6. Benign appearing cysts within the genu of the pancreas mildly increased in size from 2013 most consistent benign etiology. 7. 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 Electronically Signed   By: Suzy Bouchard M.D.   On: 04/21/2020 16:22    Scheduled Meds: . Chlorhexidine Gluconate Cloth  6 each Topical Daily  . heparin  5,000 Units Subcutaneous Q8H  . [START ON 04/24/2020] levothyroxine  125 mcg Oral Q0600  . [START ON 04/23/2020] levothyroxine  50 mcg Intravenous Daily  . polyethylene glycol  17 g Oral  BID  . senna-docusate  1 tablet Oral BID  . simethicone  80 mg Oral QID  . umeclidinium bromide  1 puff Inhalation Daily   Continuous Infusions: PRN Meds: acetaminophen **OR** acetaminophen, guaiFENesin, ondansetron **OR** ondansetron (ZOFRAN) IV, oxyCODONE  Time spent: 35 minutes  Author: Berle Mull, MD [facility] 04/22/2020 1:05 PM  To reach On-call, see care teams to locate the attending and reach out via [email or web address]. Between 7PM-7AM, please contact night-coverage If you still have difficulty reaching  the attending provider, please page the [facility] (Director on Call) for [facility] on amion for assistance.

## 2020-04-22 NOTE — TOC Initial Note (Signed)
Transition of Care [facility]) - Initial/Assessment Note    Patient Details  Name: Ian Aguilar MRN: [id number] Date of Birth: 09/24/1940  [facility]) CM/SW Contact:    Shade Flood, LCSW Phone Number: 04/22/2020, 2:17 PM  Clinical Narrative:                  Pt admitted from home. PT/OT recommending SNF rehab at dc. Spoke with pt this afternoon to assess. Per pt, he lives with his wife at home. Their son lives nearby but works 12 hour shifts. Pt states that his son helps with everything. Discussed SNF rehab with pt who is in agreement but asks TOC to call his son.  Spoke with pt's son, Jeneen Rinks, to discuss. Jeneen Rinks is agreeable to SNF rehab. Discussed provider options and will refer as requested. Insurance auth started for SNF and EMS.  TOC will follow.  Expected Discharge Plan: Skilled Nursing Facility Barriers to Discharge: Continued Medical Work up   Patient Goals and CMS Choice Patient states their goals for this hospitalization and ongoing recovery are:: get better CMS Medicare.gov Compare Post Acute Care list provided to:: Patient Represenative (must comment) Choice offered to / list presented to : Adult Children  Expected Discharge Plan and Services Expected Discharge Plan: [place] In-house Referral: Clinical Social Work   Post Acute Care Choice: [place] Living arrangements for the past 2 months: [place]                                      Prior Living Arrangements/Services Living arrangements for the past 2 months: Single Family Home Lives with:: Spouse Patient language and need for interpreter reviewed:: Yes Do you feel safe going back to the place where you live?: Yes      Need for Family Participation in Patient Care: Yes (Comment) Care giver support system in place?: Yes (comment) Current home services: DME Criminal Activity/Legal Involvement Pertinent to Current Situation/Hospitalization: No -  Comment as needed  Activities of Daily Living Home Assistive Devices/Equipment: None ADL Screening (condition at time of admission) Patient's cognitive ability adequate to safely complete daily activities?: Yes Is the patient deaf or have difficulty hearing?: Yes Does the patient have difficulty seeing, even when wearing glasses/contacts?: Yes Does the patient have difficulty concentrating, remembering, or making decisions?: No Patient able to express need for assistance with ADLs?: Yes Does the patient have difficulty dressing or bathing?: No Independently performs ADLs?: Yes (appropriate for developmental age) Does the patient have difficulty walking or climbing stairs?: Yes Weakness of Legs: Both Weakness of Arms/Hands: Both  Permission Sought/Granted Permission sought to share information with : Chartered certified accountant granted to share information with : Yes, Verbal Permission Granted     Permission granted to share info w AGENCY: [facility]        Emotional Assessment   Attitude/Demeanor/Rapport: Engaged Affect (typically observed): Pleasant Orientation: : Oriented to Self,Oriented to Place,Oriented to  Time,Oriented to Situation Alcohol / Substance Use: Not Applicable Psych Involvement: No (comment)  Admission diagnosis:  Abdominal distension [X44.8] Alcoholic cirrhosis of liver with ascites ([place]) [K70.31] Patient Active Problem List   Diagnosis Date Noted  . Severe hypothyroidism 04/22/2020  . Distention of biliary tract 04/21/2020  . AKI (acute kidney injury) ([place]) 04/21/2020  . Hypothyroidism 04/21/2020  . Abdominal distension 04/20/2020  . COPD exacerbation ([place]) 10/24/2019  . Acute encephalopathy 10/23/2019  .  Dehydration 10/23/2019  . COPD with acute exacerbation ([place]) 10/23/2019  . Chronic hypoxemic respiratory failure ([place]) 10/23/2019  . Hypertensive urgency 10/23/2019  . Goals of care, counseling/discussion   . Palliative care by specialist    . DNR (do not resuscitate) discussion   . Malignant neoplasm of bronchus and lung ([place])   . Carcinoma, lung ([place]) 06/05/2017  . Hepatic cirrhosis ([place]) 04/06/2017  . History of colonic polyps 04/06/2017  . History of snoring 09/07/2015  . Fracture of ulnar styloid   . Benign essential HTN   . Faintness   . Thrombocytopenia ([place]) 09/03/2015  . Hyponatremia 09/03/2015  . Closed displaced intertrochanteric fracture of right femur ([place])   . Distal radius fracture, right 09/02/2015  . Fracture of right hip requiring operative repair ([place]) 09/01/2015  . Alcohol abuse 09/01/2015  . Tobacco abuse 09/01/2015  . COPD (chronic obstructive pulmonary disease) 09/01/2015  . 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 Weakness of left leg 01/10/2012  . Difficulty in walking(719.7) 01/10/2012  . Pain in left hip 01/10/2012  . Acute blood loss anemia 12/21/2011  . Cirrhosis- imaging diagnosis 2013 09/08/2011  . Umbilical hernia 93/23/5573  . 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  . Hepatomegaly 06/14/2011  . Encounter for screening colonoscopy 06/14/2011  . JOINT EFFUSION, KNEE 02/10/2008  . KNEE PAIN 02/10/2008   PCP:  Sharilyn Sites, MD Pharmacy:   [facility] [place], [place] AT [place] [address] [place] [address] Phone: [phone] Fax: [phone]  [facility] - [place], [place] - [address] [place] [address] Phone: [phone] Fax: [phone]     Social Determinants of Health (SDOH) Interventions    Readmission Risk Interventions Readmission Risk Prevention Plan 04/22/2020  Transportation Screening Complete  Medication Review (RN CM) Complete  Some recent data might be hidden

## 2020-04-22 NOTE — Plan of Care (Signed)
  Problem: Acute Rehab PT Goals(only PT should resolve) Goal: Pt Will Go Supine/Side To Sit Outcome: Progressing Flowsheets (Taken 04/22/2020 1228) Pt will go Supine/Side to Sit:  with modified independence  Independently Goal: Patient Will Transfer Sit To/From Stand Outcome: Progressing Flowsheets (Taken 04/22/2020 1228) Patient will transfer sit to/from stand: with minimal assist Goal: Pt Will Transfer Bed To Chair/Chair To Bed Outcome: Progressing Flowsheets (Taken 04/22/2020 1228) Pt will Transfer Bed to Chair/Chair to Bed: with min assist Goal: Pt Will Ambulate Outcome: Progressing Flowsheets (Taken 04/22/2020 1228) Pt will Ambulate:  25 feet  with minimal assist  with moderate assist  with rolling walker  12:29 PM, 04/22/20 Lonell Grandchild, MPT Physical Therapist with [facility] [phone] office [phone] mobile phone

## 2020-04-22 NOTE — Plan of Care (Signed)
  Problem: Acute Rehab OT Goals (only OT should resolve) Goal: Pt. Will Perform Upper Body Dressing Flowsheets (Taken 04/22/2020 1005) Pt Will Perform Upper Body Dressing:  with modified independence  sitting Goal: Pt. Will Perform Lower Body Dressing Flowsheets (Taken 04/22/2020 1005) Pt Will Perform Lower Body Dressing:  with min assist  sitting/lateral leans  sit to/from stand Goal: Pt. Will Transfer To Toilet Flowsheets (Taken 04/22/2020 1005) Pt Will Transfer to Toilet:  with min guard assist  stand pivot transfer  with supervision Goal: Pt/Caregiver Will Perform Home Exercise Program Flowsheets (Taken 04/22/2020 1005) Pt/caregiver will Perform Home Exercise Program:  Both right and left upper extremity  Independently

## 2020-04-23 ENCOUNTER — Inpatient Hospital Stay
Admission: RE | Admit: 2020-04-23 | Discharge: 2020-05-24 | Disposition: A | Discharge status: Skilled Nursing Facility | Payer: PPO | Source: Ambulatory Visit | Attending: Internal Medicine | Admitting: Internal Medicine

## 2020-04-23 DIAGNOSIS — E871 Hypo-osmolality and hyponatremia: Secondary | ICD-10-CM | POA: Diagnosis not present

## 2020-04-23 DIAGNOSIS — K5909 Other constipation: Secondary | ICD-10-CM | POA: Diagnosis not present

## 2020-04-23 DIAGNOSIS — R262 Difficulty in walking, not elsewhere classified: Secondary | ICD-10-CM | POA: Diagnosis not present

## 2020-04-23 DIAGNOSIS — J449 Chronic obstructive pulmonary disease, unspecified: Secondary | ICD-10-CM | POA: Diagnosis not present

## 2020-04-23 DIAGNOSIS — I10 Essential (primary) hypertension: Secondary | ICD-10-CM | POA: Diagnosis not present

## 2020-04-23 DIAGNOSIS — E785 Hyperlipidemia, unspecified: Secondary | ICD-10-CM | POA: Diagnosis not present

## 2020-04-23 DIAGNOSIS — M6281 Muscle weakness (generalized): Secondary | ICD-10-CM | POA: Diagnosis not present

## 2020-04-23 DIAGNOSIS — J438 Other emphysema: Secondary | ICD-10-CM | POA: Diagnosis not present

## 2020-04-23 DIAGNOSIS — J9611 Chronic respiratory failure with hypoxia: Secondary | ICD-10-CM | POA: Diagnosis not present

## 2020-04-23 DIAGNOSIS — I700 Atherosclerosis of aorta: Secondary | ICD-10-CM | POA: Diagnosis not present

## 2020-04-23 DIAGNOSIS — R4189 Other symptoms and signs involving cognitive functions and awareness: Secondary | ICD-10-CM | POA: Diagnosis not present

## 2020-04-23 DIAGNOSIS — K838 Other specified diseases of biliary tract: Secondary | ICD-10-CM | POA: Diagnosis not present

## 2020-04-23 DIAGNOSIS — N179 Acute kidney failure, unspecified: Secondary | ICD-10-CM | POA: Diagnosis not present

## 2020-04-23 DIAGNOSIS — Z720 Tobacco use: Secondary | ICD-10-CM | POA: Diagnosis not present

## 2020-04-23 DIAGNOSIS — E038 Other specified hypothyroidism: Secondary | ICD-10-CM | POA: Diagnosis not present

## 2020-04-23 DIAGNOSIS — G9340 Encephalopathy, unspecified: Secondary | ICD-10-CM | POA: Diagnosis not present

## 2020-04-23 DIAGNOSIS — K7460 Unspecified cirrhosis of liver: Secondary | ICD-10-CM | POA: Diagnosis not present

## 2020-04-23 DIAGNOSIS — R41841 Cognitive communication deficit: Secondary | ICD-10-CM | POA: Diagnosis not present

## 2020-04-23 DIAGNOSIS — E875 Hyperkalemia: Secondary | ICD-10-CM | POA: Diagnosis not present

## 2020-04-23 DIAGNOSIS — Z9181 History of falling: Secondary | ICD-10-CM | POA: Diagnosis not present

## 2020-04-23 DIAGNOSIS — R29818 Other symptoms and signs involving the nervous system: Secondary | ICD-10-CM | POA: Diagnosis not present

## 2020-04-23 DIAGNOSIS — F1011 Alcohol abuse, in remission: Secondary | ICD-10-CM | POA: Diagnosis not present

## 2020-04-23 DIAGNOSIS — J441 Chronic obstructive pulmonary disease with (acute) exacerbation: Secondary | ICD-10-CM | POA: Diagnosis not present

## 2020-04-23 DIAGNOSIS — C3491 Malignant neoplasm of unspecified part of right bronchus or lung: Secondary | ICD-10-CM | POA: Diagnosis not present

## 2020-04-23 DIAGNOSIS — K7030 Alcoholic cirrhosis of liver without ascites: Secondary | ICD-10-CM | POA: Diagnosis not present

## 2020-04-23 DIAGNOSIS — Z978 Presence of other specified devices: Secondary | ICD-10-CM | POA: Diagnosis not present

## 2020-04-23 DIAGNOSIS — L03114 Cellulitis of left upper limb: Secondary | ICD-10-CM | POA: Diagnosis not present

## 2020-04-23 DIAGNOSIS — R932 Abnormal findings on diagnostic imaging of liver and biliary tract: Secondary | ICD-10-CM | POA: Diagnosis not present

## 2020-04-23 LAB — COMPREHENSIVE METABOLIC PANEL
ALT: 49 U/L — ABNORMAL HIGH (ref 0–44)
AST: 121 U/L — ABNORMAL HIGH (ref 15–41)
Albumin: 3.8 g/dL (ref 3.5–5.0)
Alkaline Phosphatase: 77 U/L (ref 38–126)
Anion gap: 10 (ref 5–15)
BUN: 22 mg/dL (ref 8–23)
CO2: 31 mmol/L (ref 22–32)
Calcium: 9.3 mg/dL (ref 8.9–10.3)
Chloride: 92 mmol/L — ABNORMAL LOW (ref 98–111)
Creatinine, Ser: 1.36 mg/dL — ABNORMAL HIGH (ref 0.61–1.24)
GFR, Estimated: 53 mL/min — ABNORMAL LOW (ref >60)
Glucose, Bld: 77 mg/dL (ref 70–99)
Potassium: 4 mmol/L (ref 3.5–5.1)
Sodium: 133 mmol/L — ABNORMAL LOW (ref 135–145)
Total Bilirubin: 1 mg/dL (ref 0.3–1.2)
Total Protein: 6.6 g/dL (ref 6.5–8.1)

## 2020-04-23 LAB — CBC
HCT: 30.7 % — ABNORMAL LOW (ref 39.0–52.0)
Hemoglobin: 10.2 g/dL — ABNORMAL LOW (ref 13.0–17.0)
MCH: 31.7 pg (ref 26.0–34.0)
MCHC: 33.2 g/dL (ref 30.0–36.0)
MCV: 95.3 fL (ref 80.0–100.0)
Platelets: 152 10*3/uL (ref 150–400)
RBC: 3.22 MIL/uL — ABNORMAL LOW (ref 4.22–5.81)
RDW: 17.2 % — ABNORMAL HIGH (ref 11.5–15.5)
WBC: 6.8 10*3/uL (ref 4.0–10.5)
nRBC: 0 % (ref 0.0–0.2)

## 2020-04-23 LAB — RESP PANEL BY RT-PCR (FLU A&B, COVID) ARPGX2
Influenza A by PCR: NEGATIVE
Influenza B by PCR: NEGATIVE
SARS Coronavirus 2 by RT PCR: NEGATIVE

## 2020-04-23 LAB — AFP TUMOR MARKER: AFP, Serum, Tumor Marker: 6.9 ng/mL (ref 0.0–8.3)

## 2020-04-23 LAB — MAGNESIUM: Magnesium: 2.1 mg/dL (ref 1.7–2.4)

## 2020-04-23 MED ORDER — LEVOTHYROXINE SODIUM 125 MCG PO TABS: 125.0000 ug | ORAL_TABLET | Freq: Every day | ORAL | 0 refills | Status: DC

## 2020-04-23 MED ORDER — HEPARIN SOD (PORK) LOCK FLUSH 100 UNIT/ML IV SOLN
500.0000 [IU] | Freq: Once | INTRAVENOUS | Status: AC
  Administered 2020-04-23: 500 [IU]
  Filled 2020-04-23: qty 5

## 2020-04-23 MED ORDER — POLYETHYLENE GLYCOL 3350 17 G PO PACK: 17.0000 g | PACK | Freq: Every day | ORAL | 0 refills | Status: DC

## 2020-04-23 MED ORDER — DOCUSATE SODIUM 100 MG PO CAPS: 100.0000 mg | ORAL_CAPSULE | Freq: Two times a day (BID) | ORAL | 2 refills | Status: AC

## 2020-04-23 NOTE — TOC Progression Note (Signed)
Transition of Care [facility]) - Progression Note   Patient Details  Name: Ian Aguilar MRN: [id number] Date of Birth: 07-Nov-1940  Transition of Care [facility]) CM/SW [place], LCSW Phone Number: 04/23/2020, 11:33 AM  Clinical Narrative: HTA faxed ambulance transport denial to [facility] to provide patient as patient will not require EMS transport since he will be discharging to [facility]. Paperwork provided and explained to patient. TOC to follow.  Expected Discharge Plan: [place] Barriers to Discharge: Continued Medical Work up  Expected Discharge Plan and Services Expected Discharge Plan: [place] In-house Referral: Clinical Social Work Post Acute Care Choice: [place] Living arrangements for the past 2 months: Single Family Home  Readmission Risk Interventions Readmission Risk Prevention Plan 04/22/2020  Transportation Screening Complete  Medication Review (RN CM) Complete  Some recent data might be hidden

## 2020-04-23 NOTE — Discharge Summary (Signed)
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 PCP: please follow up with PCP in 1 week 2. Follow up LABS/TEST:  Repeat TSH, free T4, BMP and LFT   Contact information for follow-up providers    Sharilyn Sites, MD. Schedule an appointment as soon as possible for a visit in 1 week(s).   Specialty: Family Medicine Why: needs repeat TSH free T4 Contact information: [address] [place] [address] [phone]            Contact information for after-discharge care    [facility] .   Service: Skilled Nursing Contact information: [address] [place] [phone]                 Discharge Instructions    Diet - low sodium heart healthy   Complete by: As directed    Increase activity slowly   Complete by: As directed       Diet recommendation: Cardiac diet  Activity: The patient is advised to gradually reintroduce usual activities, as tolerated  Discharge Condition: stable  Code Status: DNR   History of present illness: As per the H and P dictated on admission, "[name]  is a 80 y.o. male, with history of upper lobe right lung nodule, hypertension, hyperlipidemia, alcohol abuse-quit 4 years ago, COPD, cirrhosis, anxiety presents to the ED with a chief complaint of abdominal distention.  Reports that he has had gradual  buildup of abdominal distention over the last 4 weeks.  He has an associated 35 pound weight gain.  He has had an associated gradual increase in fatigue as well, but then this weekend he slept more than 24 hours in a row.  This was concerning for him.  He reports that up until that sleeping more than 24 hours in a row, he was eating and drinking normally.  His bowel movements have been normal.  He has felt nauseous but has not thrown up.  He denies any belly pain.  He denies any changes in his urine.  Patient still has his gallbladder.  At first he reports that he has not had liver problems but then reports that he has cirrhosis so maybe he does have liver problems.  Patient denies any fevers, change in his shortness of breath or cough, chest pain, palpitations.  Patient reports that the two things that mostly triggered him to come into the ER, the increased fatigue, and the difficulty with moving around with his belly so distended.  Patient quit smoking 4 years ago, has 1 cigarette a month since then.  He quit drinking 4 years ago.  He has had three Covid shots.  Patient prefers to be DNR."  Hospital Course:  Summary of his active problems in the hospital is as following. 1.  Severe hypothyroidism without any, Acute metabolic encephalopathy Sinus bradycardia TSH 219 on admission.  Trending  down to 230. Patient supposed to be on 100 MCG Synthroid but has not been prescribed this medication at least since Thanksgiving. Free T4 low. Currently being supplemented with IV Synthroid 50 MCG. Patient also received IV Decadron and Solu-Cortef although cortisol level is adequate therefore no indication for continue steroids. Able to maintain temperature, able to eat oral diet and not comatose therefore we will transition to p.o. Synthroid.   2.  Hyponatremia Secondary to hypothyroidism. Now stable  3.  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 Abdominal distention Primary  reason for presentation is abdominal distention. This is likely secondary to constipation and flatulence as well as bladder distention. Symptoms appears to be stable for now. INR normal ammonia normal.  No asterixis.  Patient not drinking alcohol for last 4 years. LFTs were mildly elevated.  CBD was dilated even back in 2013. MRCP this admission shows dilated CBD without any evidence of mass or tumor. Outpatient follow-up recommended.  No ERCP right now.  Tolerating oral diet.   Treating constipation with aggressive bowel regimen.  4.  Right wrist fracture Seen outpatient by PCP. Consulted orthopedic. Currently pain control and splinting recommended.  5.  Acute kidney injury Secondary to poor p.o. intake. Stable after initial improvement.  Monitor.  Baseline serum creatinine 0.80.7. On admission serum creatinine 1.4. Recommended BMP in 1week and avoiding nephrotoxic meds. This might be a new baseline for the pt.   6.  Essential hypertension Blood pressure improving. Continue to monitor for now.  7.  Deconditioning. PT OT recommends SNF.  Patient was able to walk only 8 feet. Ataxic on examination. Secondary to hypothyroidism. Once therapy is restored gradually patient's strength and ambulation should improve over weeks. Patient will benefit from aggressive therapy at SNF.  Also will require close observation to ensure that the patient is receiving his Synthroid and other medications.  Patient was seen by physical therapy, who recommended SNF. On the day of the discharge the patient's vitals were stable, and no other acute medical condition were reported by patient. The patient was felt safe to be discharge at SNF with Therapy.  Consultants: Gastroenterology  Procedures: none  DISCHARGE MEDICATION: Allergies as of 04/23/2020   No Known Allergies     Medication List    TAKE these medications   albuterol 108 (90 Base) MCG/ACT inhaler Commonly known as: VENTOLIN  HFA Inhale into the lungs.   cholecalciferol 25 MCG (1000 UNIT) tablet Commonly known as: VITAMIN D Take 1,000 Units by mouth daily.   docusate sodium 100 MG capsule Commonly known as: Colace Take 1 capsule (100 mg total) by mouth 2 (two) times daily.   guaiFENesin 600 MG 12 hr tablet Commonly known as: MUCINEX Take 600 mg by mouth as needed (congestion).   levothyroxine 125 MCG tablet Commonly known as: SYNTHROID Take 1 tablet (125 mcg total) by mouth daily at 6 (six) AM. Start taking on: April 24, 2020 What changed:   medication strength  how much to take  when to take this   meclizine 25 MG tablet Commonly known as: ANTIVERT Take 25 mg by mouth as needed for dizziness.   polyethylene glycol 17 g packet Commonly known as: MIRALAX / GLYCOLAX Take 17 g by mouth daily.   Propylene Glycol-Glycerin 0.6-0.6 % Soln Place 1-2 drops into both eyes as needed (for dry/irritated eyes.).   rosuvastatin 10 MG tablet Commonly known as: CRESTOR Take 10 mg by mouth daily.   Spiriva HandiHaler 18 MCG inhalation capsule  Generic drug: tiotropium Place 18 mcg into inhaler and inhale daily.      Discharge Exam: Filed Weights   04/21/20 0849 04/22/20 0500 04/23/20 0500  Weight: 86.7 kg 85.1 kg 87.9 kg   Vitals:   04/23/20 0457 04/23/20 1010  BP: (!) 131/97   Pulse: 61   Resp: 18   Temp: 98 F (36.7 C)   SpO2: 96% 92%   General: Appear in no distress, no Rash; Oral Mucosa Clear, moist. no Abnormal Neck Mass Or lumps, Conjunctiva normal  Cardiovascular: S1 and S2 Present, no Murmur Respiratory: good respiratory effort, Bilateral Air entry present and CTA, no Crackles, no wheezes Abdomen: Bowel Sound present, Soft and no tenderness Extremities: no Pedal edema Neurology: alert and oriented to time, place, and person affect appropriate. no new focal deficit  The results of significant diagnostics from this hospitalization (including imaging, microbiology, ancillary and  laboratory) are listed below for reference.    Significant Diagnostic Studies: US Abdomen Complete  Result Date: 04/20/2020 CLINICAL DATA:  Cirrhosis and abdominal distension. EXAM: ABDOMEN ULTRASOUND COMPLETE COMPARISON:  None. FINDINGS: Gallbladder: No gallstones or wall thickening visualized (2.4 mm). No sonographic Murphy sign noted by sonographer. Common bile duct: Diameter: 3.7 mm Liver: The echotexture of the liver is coarse and nodular in appearance. No focal lesion identified. Diffusely increased echogenicity of the liver parenchyma is seen. Portal vein is patent on color Doppler imaging with normal direction of blood flow towards the liver. IVC: No abnormality visualized. Pancreas: Visualized portion unremarkable. Spleen: Size (7.8 cm) and appearance within normal limits. Right Kidney: Length: 10.2 cm. Echogenicity within normal limits. No mass or hydronephrosis visualized. Left Kidney: Length: 10.6 cm. Echogenicity within normal limits. No mass or hydronephrosis visualized. Abdominal aorta: No aneurysm visualized (1.8 cm in AP diameter). Other findings: A mild amount of free fluid is seen within the right lower quadrant. IMPRESSION: 1. Cirrhotic liver. 2. Small amount of free fluid. Electronically Signed   By: Virgina Norfolk M.D.   On: 04/20/2020 19:16   CT ABDOMEN PELVIS W CONTRAST  Result Date: 04/20/2020 CLINICAL DATA:  Abdominal pain and bloating with nausea for 4 days. EXAM: CT ABDOMEN AND PELVIS WITH CONTRAST TECHNIQUE: Multidetector CT imaging of the abdomen and pelvis was performed using the standard protocol following bolus administration of intravenous contrast. CONTRAST:  183mL OMNIPAQUE IOHEXOL 300 MG/ML  SOLN COMPARISON:  Multiple prior studies most recent from October of 2021 FINDINGS: Lower chest: Minimal basilar atelectasis. Hepatobiliary: Mild peripheral biliary duct distension in the LEFT hepatic lobe lateral segment. Periportal hypoattenuation unchanged dating back to  October of 2019. No focal, suspicious hepatic lesion on venous phase assessment. However, on delayed phase imaging there is suggestion of added density within the central portion of this area raising the question of indolent biliary neoplasm. (Image 96 of series 7) perhaps measuring as large as 1 x 0.8 cm hepatic veins are patent. Gallbladder is nondistended. Common bile duct is nondilated. Pancreas: Pancreas with signs of mild atrophy. Hypoattenuating lesion in the genu of the pancreas measures 8 mm unchanged since 2019 and not associated with main duct distension or inflammation. Spleen: Spleen normal size with small amount of perisplenic ascites. Adrenals/Urinary Tract: Adrenal glands are normal. Symmetric renal enhancement. No hydronephrosis. Smooth contour the urinary bladder. Stomach/Bowel: Mild gastric thickening is suggested versus under distension with little change. No small bowel dilation. No significant perienteric stranding. Mild generalized mesenteric edema in the setting of portal hypertension now with ascites. Appendix not clearly visualized, perhaps  partially visualized along the RIGHT flank. No specific secondary signs of acute appendicitis Vascular/Lymphatic: Calcified and noncalcified atheromatous plaque in the abdominal aorta. There is no gastrohepatic or hepatoduodenal ligament lymphadenopathy. No retroperitoneal or mesenteric lymphadenopathy. Smooth contour the IVC. No pelvic sidewall lymphadenopathy. Streak artifact from LEFT hip arthroplasty limits assessment. Changes of RIGHT hip ORIF also limiting assessment. Reproductive: Prostate unremarkable by CT. Other: Small volume ascites is new compared to previous imaging. No free air. Small fat containing umbilical hernia. Musculoskeletal: Spinal degenerative changes. No acute or destructive bone process. Post RIGHT femoral ORIF. Post LEFT total hip arthroplasty. IMPRESSION: 1. Cirrhosis with signs of ascites, new from prior imaging. 2. Question  of gastric thickening may represent gastritis or portal gastropathy. 3. Peribiliary hypoattenuation perhaps a combination of biliary cysts and biliary duct distension. Question of lesion within the biliary ducts in hepatic subsegment III where there is variable attenuation within the ducts best seen on image 36 of series 5. Stability argues against aggressive process but indolent biliary neoplasm or small stones could have this appearance. When the patient is clinically stable and able to follow directions and hold their breath (preferably as an outpatient) further evaluation with dedicated abdominal MRI/MRCP should be considered. 4. Hypoattenuating lesion in the genu of the pancreas measures 8 mm and unchanged since 2019 and not associated with main duct distension or inflammation. 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 Consider follow-up in 1 year with MRI. 5. Aortic atherosclerosis. Aortic Atherosclerosis (ICD10-I70.0). Electronically Signed   By: Zetta Bills M.D.   On: 04/20/2020 21:24   MR 3D Recon At Scanner  Result Date: 04/21/2020 CLINICAL DATA:  Cirrhosis. Pancreatic pseudocysts. No duct dilatation. Abdominal distension EXAM: MRI ABDOMEN WITHOUT AND WITH CONTRAST (INCLUDING MRCP) TECHNIQUE: Multiplanar multisequence MR imaging of the abdomen was performed both before and after the administration of intravenous contrast. Heavily T2-weighted images of the biliary and pancreatic ducts were obtained, and three-dimensional MRCP images were rendered by post processing. CONTRAST:  35mL GADAVIST GADOBUTROL 1 MMOL/ML IV SOLN COMPARISON:  CT 11/18/2020, MRI 07/10/2011 FINDINGS: Lower chest:  Lung bases are clear. Hepatobiliary: There is chronic intrahepatic biliary duct dilatation within LEFT hepatic lobe with ductal ectasia progressed from MRI 2013. There is no focal obstructing lesion. There is no filling defect or enhancing lesion within the LEFT hepatic duct  correspond to the abnormal finding on CT exam 1 day prior. The liver has a lobular contour. The caudate lobe is enlarged. Small volume ascites. Postcontrast enhanced imaging demonstrates no focal early arterial enhancing lesion. Portal veins are patent. The common bile duct is normal caliber. Mild duct dilatation in the RIGHT hepatic lobe is also noted (image 16/series 2) Pancreas: cystic lesion in the genu of the pancreas is unilocular without enhancement or nodularity and measures 6 mm on image 21/26. No communication with the pancreatic duct. This lesion is slightly increased in size from 5 mm on MRI 2013. No pancreatic atrophy. Spleen: Multiple a punctate foci of loss of signal intensity on T1 weighted imaging within the spleen most consistent with hemosiderin rich [employer] bodies. No splenomegaly. Splenic vein is patent. Adrenals/urinary tract: Adrenal glands and kidneys are normal. Stomach/Bowel: Stomach and limited of the small bowel is unremarkable Vascular/Lymphatic: Abdominal aortic normal caliber. No retroperitoneal periportal lymphadenopathy. Musculoskeletal: No aggressive osseous lesion IMPRESSION: 1. No worrisome lesion within the dilated LEFT hepatic ducts to corresponds to the abnormal finding described on comparison CT. 2. Chronic ductal ectasia and dilatation in the  LEFT hepatic lobe progressed from 2013. 3. No extrahepatic biliary duct dilatation. 4. Cirrhosis without evidence enhancing  hepatoma. 5. Small volume ascites. 6. Benign appearing cysts within the genu of the pancreas mildly increased in size from 2013 most consistent benign etiology. 7. 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 Electronically Signed   By: Suzy Bouchard M.D.   On: 04/21/2020 16:22   DG CHEST PORT 1 VIEW  Result Date: 04/22/2020 CLINICAL DATA:  Cough, weakness. EXAM: PORTABLE CHEST 1 VIEW COMPARISON:  October 22, 2019. FINDINGS: The heart size and mediastinal contours  are within normal limits. No pneumothorax or pleural effusion is noted. Left subclavian Port-A-Cath is unchanged in position. Right lung is clear. Mild left midlung subsegmental atelectasis or scarring is noted. The visualized skeletal structures are unremarkable. IMPRESSION: Mild left midlung subsegmental atelectasis or scarring. Aortic Atherosclerosis (ICD10-I70.0). Electronically Signed   By: Marijo Conception M.D.   On: 04/22/2020 15:40   DG Abd Portable 1V  Result Date: 04/22/2020 CLINICAL DATA:  Constipation, abdominal bloating. EXAM: PORTABLE ABDOMEN - 1 VIEW COMPARISON:  None. FINDINGS: No small bowel dilatation is noted. Mild amount of stool seen throughout the colon. Mild colonic dilatation is noted which may represent ileus. Contrast is noted in the urinary bladder. IMPRESSION: Mild colonic dilatation is noted which may represent ileus. Mild stool burden is noted. Electronically Signed   By: Marijo Conception M.D.   On: 04/22/2020 15:41   DG Hand Complete Right  Result Date: 04/20/2020 CLINICAL DATA:  Right hand pain. Fall last night going into closet. Pain, swelling and bruising about the fifth metacarpal. EXAM: RIGHT HAND - COMPLETE 3+ VIEW COMPARISON:  None. FINDINGS: Minimally displaced and angulated distal fifth metacarpal fracture. There is no intra-articular extension. No other fracture of the hand. The bones are diffusely under mineralized. There is mild osteoarthritis most prominently involving the thumb carpal metacarpal. Remote healed distal radius fracture. Soft tissue edema about the ulnar aspect of the hand. IMPRESSION: Minimally displaced and angulated distal fifth metacarpal fracture. No intra-articular extension. Electronically Signed   By: Keith Rake M.D.   On: 04/20/2020 15:45   MR ABDOMEN MRCP W WO CONTAST  Result Date: 04/21/2020 CLINICAL DATA:  Cirrhosis. Pancreatic pseudocysts. No duct dilatation. Abdominal distension EXAM: MRI ABDOMEN WITHOUT AND WITH CONTRAST  (INCLUDING MRCP) TECHNIQUE: Multiplanar multisequence MR imaging of the abdomen was performed both before and after the administration of intravenous contrast. Heavily T2-weighted images of the biliary and pancreatic ducts were obtained, and three-dimensional MRCP images were rendered by post processing. CONTRAST:  41mL GADAVIST GADOBUTROL 1 MMOL/ML IV SOLN COMPARISON:  CT 11/18/2020, MRI 07/10/2011 FINDINGS: Lower chest:  Lung bases are clear. Hepatobiliary: There is chronic intrahepatic biliary duct dilatation within LEFT hepatic lobe with ductal ectasia progressed from MRI 2013. There is no focal obstructing lesion. There is no filling defect or enhancing lesion within the LEFT hepatic duct correspond to the abnormal finding on CT exam 1 day prior. The liver has a lobular contour. The caudate lobe is enlarged. Small volume ascites. Postcontrast enhanced imaging demonstrates no focal early arterial enhancing lesion. Portal veins are patent. The common bile duct is normal caliber. Mild duct dilatation in the RIGHT hepatic lobe is also noted (image 16/series 2) Pancreas: cystic lesion in the genu of the pancreas is unilocular without enhancement or nodularity and measures 6 mm on image 21/26. No communication with the pancreatic duct. This lesion is slightly increased in size from  5 mm on MRI 2013. No pancreatic atrophy. Spleen: Multiple a punctate foci of loss of signal intensity on T1 weighted imaging within the spleen most consistent with hemosiderin rich [employer] bodies. No splenomegaly. Splenic vein is patent. Adrenals/urinary tract: Adrenal glands and kidneys are normal. Stomach/Bowel: Stomach and limited of the small bowel is unremarkable Vascular/Lymphatic: Abdominal aortic normal caliber. No retroperitoneal periportal lymphadenopathy. Musculoskeletal: No aggressive osseous lesion IMPRESSION: 1. No worrisome lesion within the dilated LEFT hepatic ducts to corresponds to the abnormal finding described on  comparison CT. 2. Chronic ductal ectasia and dilatation in the LEFT hepatic lobe progressed from 2013. 3. No extrahepatic biliary duct dilatation. 4. Cirrhosis without evidence enhancing  hepatoma. 5. Small volume ascites. 6. Benign appearing cysts within the genu of the pancreas mildly increased in size from 2013 most consistent benign etiology. 7. 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 Electronically Signed   By: Suzy Bouchard M.D.   On: 04/21/2020 16:22    Microbiology: Recent Results (from the past 240 hour(s))  Resp Panel by RT-PCR (Flu A&B, Covid) Nasopharyngeal Swab     Status: None   Collection Time: 04/20/20  4:08 PM   Specimen: Nasopharyngeal Swab; Nasopharyngeal(NP) swabs in vial transport medium  Result Value Ref Range Status   SARS Coronavirus 2 by RT PCR NEGATIVE NEGATIVE Final    Comment: (NOTE) SARS-CoV-2 target nucleic acids are NOT DETECTED.  The SARS-CoV-2 RNA is generally detectable in upper respiratory specimens during the acute phase of infection. The lowest concentration of SARS-CoV-2 viral copies this assay can detect is 138 copies/mL. A negative result does not preclude SARS-Cov-2 infection and should not be used as the sole basis for treatment or other patient management decisions. A negative result may occur with  improper specimen collection/handling, submission of specimen other than nasopharyngeal swab, presence of viral mutation(s) within the areas targeted by this assay, and inadequate number of viral copies(<138 copies/mL). A negative result must be combined with clinical observations, patient history, and epidemiological information. The expected result is Negative.  Fact Sheet for Patients:  EntrepreneurPulse.com.au  Fact Sheet for Healthcare Providers:  IncredibleEmployment.be  This test is no t yet approved or cleared by the [place] FDA and  has been  authorized for detection and/or diagnosis of SARS-CoV-2 by FDA under an Emergency Use Authorization (EUA). This EUA will remain  in effect (meaning this test can be used) for the duration of the COVID-19 declaration under Section 564(b)(1) of the Act, 21 U.S.C.section 360bbb-3(b)(1), unless the authorization is terminated  or revoked sooner.       Influenza A by PCR NEGATIVE NEGATIVE Final   Influenza B by PCR NEGATIVE NEGATIVE Final    Comment: (NOTE) The Xpert Xpress SARS-CoV-2/FLU/RSV plus assay is intended as an aid in the diagnosis of influenza from Nasopharyngeal swab specimens and should not be used as a sole basis for treatment. Nasal washings and aspirates are unacceptable for Xpert Xpress SARS-CoV-2/FLU/RSV testing.  Fact Sheet for Patients: EntrepreneurPulse.com.au  Fact Sheet for Healthcare Providers: IncredibleEmployment.be  This test is not yet approved or cleared by the [place] FDA and has been authorized for detection and/or diagnosis of SARS-CoV-2 by FDA under an Emergency Use Authorization (EUA). This EUA will remain in effect (meaning this test can be used) for the duration of the COVID-19 declaration under Section 564(b)(1) of the Act, 21 U.S.C. section 360bbb-3(b)(1), unless the authorization is terminated or revoked.  Performed at [facility], [phone]  [address] [place], [place] [address]      Labs: CBC: Recent Labs  Lab 04/20/20 1615 04/21/20 0423 04/22/20 0453 04/23/20 0631  WBC 3.6* 4.3 4.5 6.8  NEUTROABS 2.3  --   --   --   HGB 11.5* 11.4* 11.5* 10.2*  HCT 34.4* 35.4* 35.5* 30.7*  MCV 94.8 95.2 94.7 95.3  PLT 163 155 157 488   Basic Metabolic Panel: Recent Labs  Lab 04/20/20 1615 04/21/20 0423 04/22/20 0453 04/23/20 0631  NA 130* 134* 130* 133*  K 4.1 3.9 3.9 4.0  CL 94* 92* 93* 92*  CO2 30 31 27 31   GLUCOSE 89 88 118* 77  BUN 15 14 16 22   CREATININE 1.40* 1.34* 1.37* 1.36*  CALCIUM 8.9  9.2 8.8* 9.3  MG  --   --  2.1 2.1   Liver Function Tests: Recent Labs  Lab 04/20/20 1615 04/21/20 0423 04/22/20 0453 04/23/20 0631  AST 146* 142* 123* 121*  ALT 57* 55* 49* 49*  ALKPHOS 90 90 84 77  BILITOT 0.9 1.2 1.0 1.0  PROT 6.9 6.8 6.5 6.6  ALBUMIN 3.9 3.9 3.7 3.8   CBG: No results for input(s): GLUCAP in the last 168 hours.  Time spent: 35 minutes  Signed:  Berle Mull  [facility]  04/23/2020 11:37 AM

## 2020-04-23 NOTE — Progress Notes (Signed)
Appropriate phone number called and report given to receiving nurse at [facility] at [phone].  All questions answered at that time.    Chest port de-accessed and dressing applied.

## 2020-04-23 NOTE — Progress Notes (Signed)
Physical Therapy Treatment Patient Details Name: Ian Aguilar MRN: [id number] DOB: 09-08-1940 Today's Date: 04/23/2020    History of Present Illness Ian Aguilar  is a 80 y.o. male, with history of upper lobe right lung nodule, hypertension, hyperlipidemia, alcohol abuse-quit 4 years ago, COPD, cirrhosis, anxiety presents to the ED with a chief complaint of abdominal distention.  Reports that he has had gradual buildup of abdominal distention over the last 4 weeks.  He has an associated 35 pound weight gain.  He has had an associated gradual increase in fatigue as well, but then this weekend he slept more than 24 hours in a row.  This was concerning for him.  He reports that up until that sleeping more than 24 hours in a row, he was eating and drinking normally.  His bowel movements have been normal.  He has felt nauseous but has not thrown up.  He denies any belly pain.  He denies any changes in his urine.  Patient still has his gallbladder.  At first he reports that he has not had liver problems but then reports that he has cirrhosis so maybe he does have liver problems.  Patient denies any fevers, change in his shortness of breath or cough, chest pain, palpitations.  Patient reports that the two things that mostly triggered him to come into the ER, the increased fatigue, and the difficulty with moving around with his belly so distended.    PT Comments    Patient seated in chair at beginning of session. Patient completes exercises edge of chair with good sitting balance and sitting tolerance. Patient educated on continuing to complete those exercises while in hospital and upon discharge for improving circulation/strength. Patient O2 sat monitored throughout session at 96-98% on 2L at rest, 93-96% on RA at rest, and O2 sat decreased to 86% while standing on room air. Patient requires mod assist to transfer to standing with RW and is unsteady upon standing. He demonstrates fair standing balance and  standing tolerance today and is limited by fatigue as O2 sat begins decreasing. Supplemental O2 replaced which slowly increased to low-mid 90s. Patient will benefit from continued physical therapy in hospital and recommended venue below to increase strength, balance, endurance for safe ADLs and gait.   Follow Up Recommendations  SNF     Equipment Recommendations  None recommended by PT    Recommendations for Other Services       Precautions / Restrictions Precautions Precautions: Fall Restrictions Weight Bearing Restrictions: No    Mobility  Bed Mobility               General bed mobility comments: seated in chair at beginning of session    Transfers Overall transfer level: Needs assistance Equipment used: Rolling walker (2 wheeled) Transfers: Sit to/from [employer] Transfers Sit to Stand: Min assist;Mod assist         General transfer comment: transfer to standing with RW, unsteady upon standing initially  Ambulation/Gait                 Stairs             Wheelchair Mobility    Modified Rankin (Stroke Patients Only)       Balance Overall balance assessment: Needs assistance Sitting-balance support: Feet supported;No upper extremity supported Sitting balance-Leahy Scale: Good Sitting balance - Comments: at EOB   Standing balance support: During functional activity;No upper extremity supported Standing balance-Leahy Scale: Poor Standing balance comment: fair/poor using RW  Cognition Arousal/Alertness: Awake/alert Behavior During Therapy: WFL for tasks assessed/performed Overall Cognitive Status: Within Functional Limits for tasks assessed                                        Exercises General Exercises - Lower Extremity Long Arc Quad: AROM;Both;10 reps;Seated Hip Flexion/Marching: AROM;Both;10 reps;Seated Toe Raises: AROM;Both;10 reps;Seated Heel Raises: AROM;Both;10  reps;Seated    General Comments        Pertinent Vitals/Pain      Home Living                      Prior Function            PT Goals (current goals can now be found in the care plan section) Acute Rehab PT Goals Patient Stated Goal: return home after rehab PT Goal Formulation: With patient Time For Goal Achievement: 05/06/20 Potential to Achieve Goals: Good Progress towards PT goals: Progressing toward goals    Frequency    Min 3X/week      PT Plan Current plan remains appropriate    Co-evaluation              AM-PAC PT "6 Clicks" Mobility   Outcome Measure  Help needed turning from your back to your side while in a flat bed without using bedrails?: None Help needed moving from lying on your back to sitting on the side of a flat bed without using bedrails?: A Little Help needed moving to and from a bed to a chair (including a wheelchair)?: A Lot Help needed standing up from a chair using your arms (e.g., wheelchair or bedside chair)?: A Lot Help needed to walk in hospital room?: A Lot Help needed climbing 3-5 steps with a railing? : A Lot 6 Click Score: 15    End of Session Equipment Utilized During Treatment: Oxygen Activity Tolerance: Patient tolerated treatment well;Patient limited by fatigue Patient left: in chair;with call bell/phone within reach Nurse Communication: Mobility status PT Visit Diagnosis: Unsteadiness on feet (R26.81);Other abnormalities of gait and mobility (R26.89);Muscle weakness (generalized) (M62.81)     Time: 3546-5681 PT Time Calculation (min) (ACUTE ONLY): 20 min  Charges:  $Therapeutic Exercise: 8-22 mins                     10:33 AM, 04/23/20 Mearl Latin PT, DPT Physical Therapist at [facility]

## 2020-04-23 NOTE — TOC Progression Note (Signed)
Transition of Care [facility]) - Progression Note    Patient Details  Name: Ian Aguilar MRN: [id number] Date of Birth: Sep 07, 1940  [facility] CM/SW Contact  Shade Flood, LCSW Phone Number: 04/23/2020, 10:50 AM  Clinical Narrative:     TOC following. Auth received for 7 days of rehab from [facility] [id number]). MD anticipating dc tomorrow. Updated Kerri at [facility] and they will accept pt tomorrow. Updated family.  Weekend TOC will follow.  Expected Discharge Plan: [place] Barriers to Discharge: Continued Medical Work up  Expected Discharge Plan and Services Expected Discharge Plan: [place] In-house Referral: Clinical Social Work   Post Acute Care Choice: [place] Living arrangements for the past 2 months: Single Family Home                                       Social Determinants of Health (SDOH) Interventions    Readmission Risk Interventions Readmission Risk Prevention Plan 04/22/2020  Transportation Screening Complete  Medication Review (RN CM) Complete  Some recent data might be hidden

## 2020-04-23 NOTE — Progress Notes (Signed)
Patient ID: Ian Aguilar, male   DOB: April 04, 1940, 80 y.o.   MRN: [id number] Advice only   Requested by Dr Posey Pronto   Right hand fracture 5th MTC   hes moving the finger fine no deformity mild pain   No f/u needed

## 2020-04-23 NOTE — Progress Notes (Signed)
Attempted to call report to [facility] x3.  No answer.

## 2020-04-23 NOTE — TOC Transition Note (Signed)
Transition of Care [facility]) - CM/SW Discharge Note   Patient Details  Name: Ian Aguilar MRN: [id number] Date of Birth: [birth year]  Transition of [facility]) CM/SW Contact:  Shade Flood, LCSW Phone Number: 04/23/2020, 12:13 PM   Clinical Narrative:     Per MD, pt is stable for dc today. [facility] can take pt as long as he has a new negative covid test. DC clinical will be sent electronically. RN to call report. Family updated.  There are no other TOC needs for dc.  Final next level of care: Skilled Nursing Facility Barriers to Discharge: Barriers Resolved   Patient Goals and CMS Choice Patient states their goals for this hospitalization and ongoing recovery are:: get better CMS Medicare.gov Compare Post Acute Care list provided to:: Patient Represenative (must comment) Choice offered to / list presented to : Adult Children  Discharge Placement              Patient chooses bed at: [facility] Patient to be transferred to facility by: W/C Name of family member notified: [name] Patient and family notified of of transfer: 04/23/20  Discharge Plan and Services In-house Referral: Clinical Social Work   Post Acute Care Choice: [place]                               Social Determinants of Health (SDOH) Interventions     Readmission Risk Interventions Readmission Risk Prevention Plan 04/22/2020  Transportation Screening Complete  Medication Review (RN CM) Complete  Some recent data might be hidden

## 2020-04-23 NOTE — Care Management Important Message (Signed)
Important Message  Patient Details  Name: Ian Aguilar MRN: [id number] Date of Birth: Nov 10, 1940   Medicare Important Message Given:  Yes     Tommy Medal 04/23/2020, 12:21 PM

## 2020-04-26 ENCOUNTER — Non-Acute Institutional Stay (SKILLED_NURSING_FACILITY): Payer: PPO | Admitting: Adult Health

## 2020-04-26 ENCOUNTER — Encounter: Payer: Self-pay | Admitting: Adult Health

## 2020-04-26 DIAGNOSIS — J438 Other emphysema: Secondary | ICD-10-CM

## 2020-04-26 DIAGNOSIS — K7460 Unspecified cirrhosis of liver: Secondary | ICD-10-CM

## 2020-04-26 DIAGNOSIS — E871 Hypo-osmolality and hyponatremia: Secondary | ICD-10-CM

## 2020-04-26 DIAGNOSIS — E039 Hypothyroidism, unspecified: Secondary | ICD-10-CM

## 2020-04-26 DIAGNOSIS — E785 Hyperlipidemia, unspecified: Secondary | ICD-10-CM

## 2020-04-26 DIAGNOSIS — C3491 Malignant neoplasm of unspecified part of right bronchus or lung: Secondary | ICD-10-CM

## 2020-04-26 DIAGNOSIS — J9611 Chronic respiratory failure with hypoxia: Secondary | ICD-10-CM | POA: Diagnosis not present

## 2020-04-26 DIAGNOSIS — E038 Other specified hypothyroidism: Secondary | ICD-10-CM | POA: Diagnosis not present

## 2020-04-26 DIAGNOSIS — K5909 Other constipation: Secondary | ICD-10-CM | POA: Diagnosis not present

## 2020-04-26 DIAGNOSIS — I700 Atherosclerosis of aorta: Secondary | ICD-10-CM | POA: Diagnosis not present

## 2020-04-26 NOTE — Progress Notes (Signed)
Location:  [place] Room Number: 111/W Place of Service:  SNF (31)   CODE STATUS: DNR  No Known Allergies  Chief Complaint  Patient presents with  . Hospitalization Follow-up    Hospitalization Follow Up     HPI:  He is a 80 year old man who has been hospitalized from 04-20-20 to 04-23-20. His medical history includes right upper lobe lung nodule copd cirrhosis of liver. Prior to presenting to the ED he had 4 weeks of gradual build up of abdominal distention with a 35 pound weight gain with increase in fatigue. He slept nearly 24 hours straight. This was troubling him. He was admitted with severe hypothyroidism with a tsh 219. He had not been taking his medication since Thanksgiving. He was started on IV synthroid and transitioned to 125 mcg daily po. His hyponatremia was related to his hypothyroidism. 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 He has not had alcohol in 4 years. He has a dilate CBD; no evidence of mass or tumor. His abdominal distention due to constipation. He has a right wrist fracture was seen outpatient by his PCP ortho consulted. He is here for short term rehab with his goal to return back home. He does have some fatigue; no abdominal pain. Denies any constipation. He will continue to be followed for his chronic illnesses including: Severe hypothyroidism:Other emphysema/ chronic hypoxemic respiratory failure/ carcinoma right lung.     Past Medical History:  Diagnosis Date  . Anxiety   . Arthritis   . Cancer ([place])   . Cirrhosis ([place])    confirmed by MRI on 07/04/11.    Marland Kitchen COPD (chronic obstructive pulmonary disease) ([place])   . Depression with anxiety   . ETOH abuse   . Hyperlipidemia   . Hypertension    diet control  . Nodule of upper lobe of right lung    with mediastinal adenopathy  . Wears dentures   . Wears glasses     Past Surgical History:  Procedure Laterality Date  . bilateral cataract surgery     [place]  . broken arm  6  yrs ago   left otif of wrist-[place]  . CLOSED REDUCTION WRIST FRACTURE Right 09/02/2015   Procedure: RIGHT WRIST REDUCTION;  Surgeon: Renette Butters, MD;  Location: [place];  Service: Orthopedics;  Laterality: Right;  with MAC  . COLONOSCOPY  1996   [place]: external hemorrhoids, no polyps  . COLONOSCOPY  06/28/2011   Dr. Ala Bent diverticulosis,tubular adenoma, hyperplastic polyp  . COLONOSCOPY WITH PROPOFOL N/A 04/26/2017   Dr. Gala Romney: perianal and digital rectal examinations normal, diffusely congested colonic mucosa but otherwise normal  . ESOPHAGOGASTRODUODENOSCOPY  06/28/2011   Dr. Chelsea Aus erosive reflux esophagitis, hiatal hernia-gastritis  . ESOPHAGOGASTRODUODENOSCOPY (EGD) WITH PROPOFOL N/A 04/26/2017   Dr. Gala Romney: normal esophagus, small hiatal hernia, GAVE, portal hypertensive gastropathy, normal duodenal bulb and second portion, no specimens collected. 2 years screening   . ESOPHAGOGASTRODUODENOSCOPY (EGD) WITH PROPOFOL N/A 06/19/2019   normal esophagus, portal gastropathy, GAVE, normal duodenum. 2 years screening.   Marland Kitchen EXTERNAL EAR SURGERY     right ear-cleaned out ear and created new eardrum  . INGUINAL HERNIA REPAIR  2-3 yrs ago   left-[place]_  . INTRAMEDULLARY (IM) NAIL INTERTROCHANTERIC Right 09/02/2015   Procedure: RIGHT  INTERTROCHANTRIC HIP;  Surgeon: Renette Butters, MD;  Location: [place];  Service: Orthopedics;  Laterality: Right;  With MAC  . KNEE SURGERY     left-arthroscopy-[place]  . PORTACATH PLACEMENT  Left 06/08/2017   Procedure: INSERTION POWER PORT WITH  ATTACHED CATHETER LEFT SUBCLAVIAN;  Surgeon: Aviva Signs, MD;  Location: [facility];  Service: General;  Laterality: Left;  . SKULL FRACTURE ELEVATION     fractured cheeck bone repaired  . TOTAL HIP ARTHROPLASTY  12/18/2011   Procedure: TOTAL HIP ARTHROPLASTY;  Surgeon: Carole Civil, MD;  Location: [facility];  Service: Orthopedics;  Laterality: Left;  Marland Kitchen VIDEO BRONCHOSCOPY WITH ENDOBRONCHIAL ULTRASOUND N/A  05/04/2017   Procedure: VIDEO BRONCHOSCOPY WITH ENDOBRONCHIAL ULTRASOUND;  Surgeon: Melrose Nakayama, MD;  Location: [place];  Service: Thoracic;  Laterality: N/A;  . WISDOM TOOTH EXTRACTION      Social History   Socioeconomic History  . Marital status: Married    Spouse name: Not on file  . Number of children: Not on file  . Years of education: Not on file  . Highest education level: Not on file  Occupational History  . Occupation: retired    Fish farm manager: [employer]  Tobacco Use  . Smoking status: Former Smoker    Packs/day: 0.00    Years: 62.00    Pack years: 0.00    Types: Cigarettes    Quit date: 02/28/2019    Years since quitting: 1.1  . Smokeless tobacco: Never Used  . Tobacco comment: 1/2 ppd > 60 years as of 06/27/17: 4-5 cigarettes or less a day  Vaping Use  . Vaping Use: Never used  Substance and Sexual Activity  . Alcohol use: Not Currently    Comment: stopped 4 years ago  . Drug use: No  . Sexual activity: Yes    Birth control/protection: None  Other Topics Concern  . Not on file  Social History Narrative  . Not on file   Social Determinants of Health   Financial Resource Strain: Not on file  Food Insecurity: Not on file  Transportation Needs: Not on file  Physical Activity: Not on file  Stress: Not on file  Social Connections: Not on file  Intimate Partner Violence: Not on file   Family History  Problem Relation Age of Onset  . Stroke Mother 50  . Heart disease Father 38       MI  . Diabetes Maternal Uncle   . Colon cancer Neg Hx   . Cancer Neg Hx       VITAL SIGNS BP (!) 126/56   Pulse 63   Temp 98 F (36.7 C)   Resp 18   Ht _0  (1.803 m)   Wt 191 lb 3.2 oz (86.7 kg)   SpO2 97%   BMI 26.67 kg/m   Outpatient Encounter Medications as of 04/26/2020  Medication Sig  . albuterol (VENTOLIN HFA) 108 (90 Base) MCG/ACT inhaler Inhale 2 puffs into the lungs every 8 (eight) hours as needed.  . cholecalciferol (VITAMIN D) 1000  units tablet Take 1,000 Units by mouth daily.  Marland Kitchen docusate sodium (COLACE) 100 MG capsule Take 1 capsule (100 mg total) by mouth 2 (two) times daily.  Marland Kitchen guaiFENesin (MUCINEX) 600 MG 12 hr tablet Take 600 mg by mouth 2 (two) times daily as needed for cough (congestion).  Marland Kitchen levothyroxine (SYNTHROID) 125 MCG tablet Take 1 tablet (125 mcg total) by mouth daily at 6 (six) AM.  . NON FORMULARY Diet NAS  . OXYGEN Inhale 2 L into the lungs continuous.  . polyethylene glycol (MIRALAX / GLYCOLAX) 17 g packet Take 17 g by mouth daily.  Marland Kitchen Propylene Glycol-Glycerin 0.6-0.6 % SOLN Place  2 drops into both eyes every 8 (eight) hours as needed (for dry/irritated eyes.).  Marland Kitchen rosuvastatin (CRESTOR) 10 MG tablet Take 10 mg by mouth daily.  Marland Kitchen SPIRIVA HANDIHALER 18 MCG inhalation capsule Place 18 mcg into inhaler and inhale daily.   . [DISCONTINUED] meclizine (ANTIVERT) 25 MG tablet Take 25 mg by mouth as needed for dizziness.   No facility-administered encounter medications on file as of 04/26/2020.     SIGNIFICANT DIAGNOSTIC EXAMS  TODAY  04-20-20: right hand x-ray: Minimally displaced and angulated distal fifth metacarpal fracture. No intra-articular extension.  04-20-20: abdominal ultrasound: 1. Cirrhotic liver. 2. Small amount of free fluid.  04-21-20: ct of abdomen and pelvis:  1. Cirrhosis with signs of ascites, new from prior imaging. 2. Question of gastric thickening may represent gastritis or portal gastropathy. 3. Peribiliary hypoattenuation perhaps a combination of biliary cysts and biliary duct distension. Question of lesion within the biliary ducts in hepatic subsegment III where there is variable attenuation within the ducts best seen on image 36 of series 5. Stability argues against aggressive process but indolent biliaryneoplasm or small stones could have this  appearance. When the patient is clinically stable and able to follow directions and hold their breath (preferably as an outpatient) further  evaluation with dedicated abdominal MRI/MRCP should be considered. 4. Hypoattenuating lesion in the genu of the pancreas measures 8 mm  and unchanged since 2019 and not associated with main duct distension or inflammation. 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 Consider follow-up in 1 year with MRI. 5. Aortic atherosclerosis.  04-22-20: chest x-ray: Mild left midlung subsegmental atelectasis or scarring.Aortic Atherosclerosis   LABS REVIEWED TODAY;   04-20-20: wbc 3.6; hgb 11.5; hct 34.4; mcv 94.8 plt 163; glucose 89; bun 15; creat 1.40; k+ 4.1; na++ 130; ca 8.9 GFR 51; ast 146; alt 57; albumin 3.9; tsh 290.685 04-22-20: wbc 4.5; hgb 11.5; hct 39.5; mcv 94.7 plt 157; glucose 118; bun 16; creat 1.37; k+ 3.9; na++ 130 ca 8.8 GFR 53; ast 127; alt 49; albumin 3.7; vit B 12: 645; iron 108; tibc 298  Review of Systems  Constitutional: Positive for malaise/fatigue.  Respiratory: Negative for cough and shortness of breath.   Cardiovascular: Negative for chest pain, palpitations and leg swelling.  Gastrointestinal: Negative for abdominal pain, constipation and heartburn.  Musculoskeletal: Negative for back pain, joint pain and myalgias.  Skin: Negative.   Neurological: Negative for dizziness.  Psychiatric/Behavioral: The patient is not nervous/anxious.     Physical Exam Constitutional:      General: He is not in acute distress.    Appearance: He is well-developed, overweight and well-nourished. He is not diaphoretic.  Neck:     Thyroid: No thyromegaly.  Cardiovascular:     Rate and Rhythm: Normal rate and regular rhythm.     Pulses: Normal pulses and intact distal pulses.     Heart sounds: Murmur heard.    Pulmonary:     Effort: Pulmonary effort is normal. No respiratory distress.     Comments: Diminished breath sounds in lower lobes Abdominal:     General: Bowel sounds are normal. There is distension.     Palpations: Abdomen is soft.      Tenderness: There is no abdominal tenderness.  Musculoskeletal:        General: No edema. Normal range of motion.     Cervical back: Neck supple.     Right lower leg: No edema.     Left lower leg:  No edema.  Lymphadenopathy:     Cervical: No cervical adenopathy.  Skin:    General: Skin is warm and dry.     Comments: portacath   Neurological:     Mental Status: He is alert. Mental status is at baseline.  Psychiatric:        Mood and Affect: Mood and affect and mood normal.     ASSESSMENT/ PLAN:  TODAY  1. Severe hypothyroidism: is currently without change: tsh 290.685 will continue synthroid 125 mcg daily   2. Other emphysema/ chronic hypoxemic respiratory failure/ carcinoma right lung : is 02 dependent; will continue spiriva 18 mcg daily has albuterol 2 puffs every 8 hours as needed has mucinex 600 mg twice daily   3. Hepatic cirrhosis unspecified hepatic cirrhosis type unspecified ascites present: he is currently stable; his liver enzymes has slightly improved will continue to monitor his status  4. Hyponatremia: na++ level 130; will not make any changes at this time; will continue to monitor his status.  5. Hyperlipidemia LDL goal <100;  Is presently stable will continue crestor 10 mg daily  6. Aortic atherosclerosis: (ct 04-20-20) will monitor   7. Chronic constipation: is presently stable is taking miralax daily and colace twice daily   Will check  Hgb/hct and cmp on 04-29-20  Time spent 90 minutes: >50%  Spent with counseling coordination of care: regarding PT/OT/ST plans of care.       MD is aware of resident's narcotic use and is in agreement with current plan of care. We will attempt to wean resident as appropriate.  Ok Edwards NP [facility]  Contact [phone] Monday through Friday 8am- 5pm  After hours call [phone]

## 2020-04-27 DIAGNOSIS — E785 Hyperlipidemia, unspecified: Secondary | ICD-10-CM | POA: Insufficient documentation

## 2020-04-27 DIAGNOSIS — K5909 Other constipation: Secondary | ICD-10-CM | POA: Insufficient documentation

## 2020-04-27 DIAGNOSIS — S62309A Unspecified fracture of unspecified metacarpal bone, initial encounter for closed fracture: Secondary | ICD-10-CM | POA: Insufficient documentation

## 2020-04-27 DIAGNOSIS — E782 Mixed hyperlipidemia: Secondary | ICD-10-CM | POA: Insufficient documentation

## 2020-04-27 DIAGNOSIS — I700 Atherosclerosis of aorta: Secondary | ICD-10-CM | POA: Insufficient documentation

## 2020-04-28 ENCOUNTER — Encounter: Payer: Self-pay | Admitting: Internal Medicine

## 2020-04-28 ENCOUNTER — Non-Acute Institutional Stay (SKILLED_NURSING_FACILITY): Payer: PPO | Admitting: Internal Medicine

## 2020-04-28 DIAGNOSIS — R4189 Other symptoms and signs involving cognitive functions and awareness: Secondary | ICD-10-CM | POA: Diagnosis not present

## 2020-04-28 DIAGNOSIS — K838 Other specified diseases of biliary tract: Secondary | ICD-10-CM

## 2020-04-28 DIAGNOSIS — F1011 Alcohol abuse, in remission: Secondary | ICD-10-CM

## 2020-04-28 DIAGNOSIS — R29818 Other symptoms and signs involving the nervous system: Secondary | ICD-10-CM | POA: Diagnosis not present

## 2020-04-28 DIAGNOSIS — E038 Other specified hypothyroidism: Secondary | ICD-10-CM

## 2020-04-28 DIAGNOSIS — N179 Acute kidney failure, unspecified: Secondary | ICD-10-CM

## 2020-04-28 DIAGNOSIS — E039 Hypothyroidism, unspecified: Secondary | ICD-10-CM

## 2020-04-28 NOTE — Progress Notes (Signed)
NURSING HOME LOCATION:  [facility] ROOM NUMBER:  65 W  CODE STATUS:  DNR  PCP:  Sharilyn Sites MD  This is a comprehensive admission note to [facility] performed on this date less than 30 days from date of admission. Included are preadmission medical/surgical history; reconciled medication list; family history; social history and comprehensive review of systems.  Corrections and additions to the records were documented. Comprehensive physical exam was also performed. Additionally a clinical summary was entered for each active diagnosis pertinent to this admission in the Problem List to enhance continuity of care.  HPI: He was hospitalized 2/22-2/25/2022 presenting with abdominal distention over 4 weeks associated with 35 pound weight gain as well as fatigue.  This was in the context of an existing diagnosis of cirrhosis.  He was found to be profoundly hypothyroid with initial TSH of 291.  He did receive parenteral IV Synthroid with a decrease in the TSH to 238.  Free T4 was 0.29.  He states that he attempted to get a refill of his L-thyroxine from his PCP late last year; but never received confirmation of such. The history is somewhat dubious as he cannot tell me what day he went to get the medicine refilled. Hospital course was also complicated by [id number](810) as well as AKI.  The creatinine was 1.4 with GFR 53.   MRCP revealed a dilated common bile duct and hepatic ducts.  His AST was 146 but dropped to 121 predischarge and ALT from 57  to 49.  Past medical and surgical history: includes essential hypertension, dyslipidemia, history of alcohol abuse, history of depression, COPD, and history of anxiety. Surgeries and procedures include prior closed wrist fracture reduction, colonoscopy, EGD, inguinal hernia repair, intramedullary intertrochanteric fracture nailing, THA, and bronchoscopy.  Social history:as noted former hx of alcohol abuse;former six decade plus smoker.  Family  history:reviewed   Review of systems: Validity of his history is in question due to neurocognitive deficits suggested on exam.  He cannot tell me why he was hospitalized but subsequently stated it was because of "seizure".  He could  not describe any seizure equivalent.  He gave the date as 22nd March, 2001; yet he stated that yesterday was his birthdate.  When I asked him when he stopped drinking, he stated "more than 4 years ago in 32 or 76".  When I asked when he had stopped smoking he gave the same answer but the year as 57.  When I asked how he injured his wrist, he stated that he lost his balance.  He does describe shortness of breath as well as a nocturnal cough.  He admits to depression because of having to leave his home.  He states his wife is still there and they have applied for "government assistance".  Constitutional: No fever, significant weight change, fatigue  Eyes: No redness, discharge, pain, vision change ENT/mouth: No nasal congestion, purulent discharge, earache, change in hearing, sore throat  Cardiovascular: No chest pain, palpitations, paroxysmal nocturnal dyspnea, claudication, edema  Respiratory: No hemoptysis,  significant snoring, apnea Gastrointestinal: No heartburn, dysphagia, abdominal pain, nausea /vomiting, rectal bleeding, melena, change in bowels Genitourinary: No dysuria, hematuria, pyuria, incontinence, nocturia Musculoskeletal: No joint stiffness, joint swelling, weakness Dermatologic: No rash, pruritus, change in appearance of skin Neurologic: No dizziness, headache, syncope, numbness, tingling Psychiatric: No significant  insomnia, anorexia Endocrine: No change in hair/skin/nails, excessive thirst, excessive hunger, excessive urination  Hematologic/lymphatic: No significant bruising, lymphadenopathy, abnormal bleeding Allergy/immunology: No itchy/watery eyes, significant sneezing,  urticaria, angioedema  Physical exam:  Pertinent or positive findings: Facies  tend to be somewhat blank..  He has pattern alopecia.  He is wearing nasal oxygen.  His beard and mustache are closely cut.  He has a lower partial and upper plate.  Heart sounds are distant and the rate is slow.  Breath sounds are decreased.  He has rare scattered rhonchi.  Abdomen is protuberant.  Pedal pulses are decreased especially dorsalis pedis pulses.  The gastrocnemius muscles are atrophic; however, strength to opposition is surprisingly good.  He has scattered bruising over the upper extremities.  General appearance: Adequately nourished; no acute distress, increased work of breathing is present.   Lymphatic: No lymphadenopathy about the head, neck, axilla. Eyes: No conjunctival inflammation or lid edema is present. There is no scleral icterus. Ears:  External ear exam shows no significant lesions or deformities.   Nose:  External nasal examination shows no deformity or inflammation. Nasal mucosa are pink and moist without lesions, exudates Oral exam: Lips and gums are healthy appearing.There is no oropharyngeal erythema or exudate. Neck:  No thyromegaly, masses, tenderness noted.    Heart:  Nor gallop, murmur, click, rub.  Lungs: without wheezes,rales, rubs. Abdomen: Bowel sounds are normal.  Abdomen is soft and nontender with no organomegaly, hernias, masses. GU: Deferred  Extremities:  No cyanosis, clubbing, edema. Neurologic exam: Balance, Rhomberg, finger to nose testing could not be completed due to clinical state Skin: Warm & dry w/o tenting. No significant rash.  See clinical summary under each active problem in the Problem List with associated updated therapeutic plan

## 2020-04-29 ENCOUNTER — Other Ambulatory Visit (HOSPITAL_COMMUNITY)
Admission: RE | Admit: 2020-04-29 | Discharge: 2020-04-29 | Disposition: A | Discharge status: Home / Self Care | Payer: PPO | Source: Skilled Nursing Facility | Attending: Adult Health | Admitting: Adult Health

## 2020-04-29 ENCOUNTER — Encounter: Payer: Self-pay | Admitting: Internal Medicine

## 2020-04-29 DIAGNOSIS — R4189 Other symptoms and signs involving cognitive functions and awareness: Secondary | ICD-10-CM | POA: Insufficient documentation

## 2020-04-29 DIAGNOSIS — E871 Hypo-osmolality and hyponatremia: Secondary | ICD-10-CM | POA: Diagnosis present

## 2020-04-29 DIAGNOSIS — R29818 Other symptoms and signs involving the nervous system: Secondary | ICD-10-CM | POA: Insufficient documentation

## 2020-04-29 LAB — COMPREHENSIVE METABOLIC PANEL
ALT: 50 U/L — ABNORMAL HIGH (ref 0–44)
AST: 76 U/L — ABNORMAL HIGH (ref 15–41)
Albumin: 3.6 g/dL (ref 3.5–5.0)
Alkaline Phosphatase: 92 U/L (ref 38–126)
Anion gap: 10 (ref 5–15)
BUN: 19 mg/dL (ref 8–23)
CO2: 32 mmol/L (ref 22–32)
Calcium: 9.1 mg/dL (ref 8.9–10.3)
Chloride: 91 mmol/L — ABNORMAL LOW (ref 98–111)
Creatinine, Ser: 1.14 mg/dL (ref 0.61–1.24)
GFR, Estimated: >60 mL/min (ref >60)
Glucose, Bld: 78 mg/dL (ref 70–99)
Potassium: 4.1 mmol/L (ref 3.5–5.1)
Sodium: 133 mmol/L — ABNORMAL LOW (ref 135–145)
Total Bilirubin: 0.8 mg/dL (ref 0.3–1.2)
Total Protein: 6.5 g/dL (ref 6.5–8.1)

## 2020-04-29 LAB — HEMOGLOBIN AND HEMATOCRIT, BLOOD
HCT: 29.6 % — ABNORMAL LOW (ref 39.0–52.0)
Hemoglobin: 9.8 g/dL — ABNORMAL LOW (ref 13.0–17.0)

## 2020-04-29 NOTE — Assessment & Plan Note (Signed)
AKI :Creatinine peaked @ 1.40 with GFR 51 (Stage 3A) @ discharge creat 1.14 & GFR > 60 (Stage 2) Avoid nephrotoxic drugs

## 2020-04-29 NOTE — Assessment & Plan Note (Signed)
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

## 2020-04-29 NOTE — Assessment & Plan Note (Signed)
SLUMS MMSE indicated

## 2020-04-29 NOTE — Assessment & Plan Note (Signed)
Repeat TSH after 6-8 weeks of compliance with L thyroxine

## 2020-04-29 NOTE — Assessment & Plan Note (Signed)
Avoid hepatoxic agents; monitor LFTs

## 2020-04-29 NOTE — Assessment & Plan Note (Deleted)
Repeat TSH after 6-8 weeks of compliance with L thyroxine

## 2020-04-29 NOTE — Assessment & Plan Note (Deleted)
Off thyroid supplement since late 2021 Initial TSH 291; decrease to 238 post IV thyroid

## 2020-04-29 NOTE — Patient Instructions (Signed)
See assessment and plan under each diagnosis in the problem list and acutely for this visit 

## 2020-04-30 ENCOUNTER — Ambulatory Visit (HOSPITAL_COMMUNITY): Payer: PPO | Attending: Gastroenterology

## 2020-05-06 DIAGNOSIS — J449 Chronic obstructive pulmonary disease, unspecified: Secondary | ICD-10-CM | POA: Diagnosis not present

## 2020-05-12 ENCOUNTER — Encounter: Payer: Self-pay | Admitting: Adult Health

## 2020-05-12 ENCOUNTER — Non-Acute Institutional Stay (SKILLED_NURSING_FACILITY): Payer: PPO | Admitting: Adult Health

## 2020-05-12 DIAGNOSIS — I700 Atherosclerosis of aorta: Secondary | ICD-10-CM

## 2020-05-12 DIAGNOSIS — J438 Other emphysema: Secondary | ICD-10-CM | POA: Diagnosis not present

## 2020-05-12 DIAGNOSIS — E039 Hypothyroidism, unspecified: Secondary | ICD-10-CM

## 2020-05-12 DIAGNOSIS — E038 Other specified hypothyroidism: Secondary | ICD-10-CM | POA: Diagnosis not present

## 2020-05-12 NOTE — Progress Notes (Signed)
Location:  [facility] Room Number: 220 Place of Service:  SNF (31)   CODE STATUS: dnr  No Known Allergies  Chief Complaint  Patient presents with  . Acute Visit    Care plan meeting     HPI:  We have come together for his care plan meeting. BIMS 8/15 mood 12/30. Family present  uses 02; have been unable to wean off. . He requires supervision to extensive assist with his adls. He does feed himself. He is occasionally incontinent of bladder and bowel. Had fall on 05-11-20 slid off bed no injury. There are no reports of pain. Therapy pt/ot/st: stand pivot min assist; supervision ofr bed mobility ambulate 85 feet walker and min assist min upper body mod assist lower body and toilet; st for cognition doe have poor judgement.  weight is stable 191.6 pounds has a fair appetite with supplements. . He continues to be followed for his chronic illnesses including: Aortic atherosclerosis Other emphysema  Severe hypothyroidism  Past Medical History:  Diagnosis Date  . Anxiety   . Arthritis   . Cancer ([place])   . Cirrhosis ([place] )    confirmed by MRI on 07/04/11.    Marland Kitchen COPD (chronic obstructive pulmonary disease) ([place])   . Depression with anxiety   . ETOH abuse   . Hyperlipidemia   . Hypertension    diet control  . Nodule of upper lobe of right lung    with mediastinal adenopathy  . Wears dentures   . Wears glasses     Past Surgical History:  Procedure Laterality Date  . bilateral cataract surgery     [place]  . broken arm  6 yrs ago   left otif of wrist-[place]  . CLOSED REDUCTION WRIST FRACTURE Right 09/02/2015   Procedure: RIGHT WRIST REDUCTION;  Surgeon: Renette Butters, MD;  Location: [facility];  Service: Orthopedics;  Laterality: Right;  with MAC  . COLONOSCOPY  1996   [facility]: external hemorrhoids, no polyps  . COLONOSCOPY  06/28/2011   Dr. Ala Bent diverticulosis,tubular adenoma, hyperplastic polyp  . COLONOSCOPY WITH PROPOFOL N/A 04/26/2017   Dr. Gala Romney:  perianal and digital rectal examinations normal, diffusely congested colonic mucosa but otherwise normal  . ESOPHAGOGASTRODUODENOSCOPY  06/28/2011   Dr. Chelsea Aus erosive reflux esophagitis, hiatal hernia-gastritis  . ESOPHAGOGASTRODUODENOSCOPY (EGD) WITH PROPOFOL N/A 04/26/2017   Dr. Gala Romney: normal esophagus, small hiatal hernia, GAVE, portal hypertensive gastropathy, normal duodenal bulb and second portion, no specimens collected. 2 years screening   . ESOPHAGOGASTRODUODENOSCOPY (EGD) WITH PROPOFOL N/A 06/19/2019   normal esophagus, portal gastropathy, GAVE, normal duodenum. 2 years screening.   Marland Kitchen EXTERNAL EAR SURGERY     right ear-cleaned out ear and created new eardrum  . INGUINAL HERNIA REPAIR  2-3 yrs ago   left-[facility]  . INTRAMEDULLARY (IM) NAIL INTERTROCHANTERIC Right 09/02/2015   Procedure: RIGHT  INTERTROCHANTRIC HIP;  Surgeon: Renette Butters, MD;  Location: [facility];  Service: Orthopedics;  Laterality: Right;  With MAC  . KNEE SURGERY     left-arthroscopy-[place]  . PORTACATH PLACEMENT Left 06/08/2017   Procedure: INSERTION POWER PORT WITH  ATTACHED CATHETER LEFT SUBCLAVIAN;  Surgeon: Aviva Signs, MD;  Location: [facility];  Service: General;  Laterality: Left;  . SKULL FRACTURE ELEVATION     fractured cheeck bone repaired  . TOTAL HIP ARTHROPLASTY  12/18/2011   Procedure: TOTAL HIP ARTHROPLASTY;  Surgeon: Carole Civil, MD;  Location: [facility];  Service: Orthopedics;  Laterality: Left;  Marland Kitchen VIDEO  BRONCHOSCOPY WITH ENDOBRONCHIAL ULTRASOUND N/A 05/04/2017   Procedure: VIDEO BRONCHOSCOPY WITH ENDOBRONCHIAL ULTRASOUND;  Surgeon: Melrose Nakayama, MD;  [facility];  Service: Thoracic;  Laterality: N/A;  . WISDOM TOOTH EXTRACTION      Social History   Socioeconomic History  . Marital status: Married    Spouse name: Not on file  . Number of children: Not on file  . Years of education: Not on file  . Highest education level: Not on file  Occupational History  . Occupation:  retired    Fish farm manager: [employer]  Tobacco Use  . Smoking status: Former Smoker    Packs/day: 0.00    Years: 62.00    Pack years: 0.00    Types: Cigarettes    Quit date: 02/28/2019    Years since quitting: 1.2  . Smokeless tobacco: Never Used  . Tobacco comment: 1/2 ppd > 60 years as of 06/27/17: 4-5 cigarettes or less a day  Vaping Use  . Vaping Use: Never used  Substance and Sexual Activity  . Alcohol use: Not Currently    Comment: stopped 4 years ago  . Drug use: No  . Sexual activity: Yes    Birth control/protection: None  Other Topics Concern  . Not on file  Social History Narrative  . Not on file   Social Determinants of Health   Financial Resource Strain: Not on file  Food Insecurity: Not on file  Transportation Needs: Not on file  Physical Activity: Not on file  Stress: Not on file  Social Connections: Not on file  Intimate Partner Violence: Not on file   Family History  Problem Relation Age of Onset  . Stroke Mother 50  . Heart disease Father 59       MI  . Diabetes Maternal Uncle   . Colon cancer Neg Hx   . Cancer Neg Hx       VITAL SIGNS BP (!) 106/59   Pulse 98   Temp (!) 97.4 F (36.3 C)   Resp 18   Ht _0  (1.803 m)   Wt 191 lb 9.6 oz (86.9 kg)   BMI 26.72 kg/m   Outpatient Encounter Medications as of 05/12/2020  Medication Sig  . albuterol (VENTOLIN HFA) 108 (90 Base) MCG/ACT inhaler Inhale 2 puffs into the lungs every 8 (eight) hours as needed.  . cholecalciferol (VITAMIN D) 1000 units tablet Take 1,000 Units by mouth daily.  Marland Kitchen docusate sodium (COLACE) 100 MG capsule Take 1 capsule (100 mg total) by mouth 2 (two) times daily.  . feeding supplement (ENSURE ENLIVE / ENSURE PLUS) LIQD Take 237 mLs by mouth 2 (two) times daily between meals.  Marland Kitchen guaiFENesin (MUCINEX) 600 MG 12 hr tablet Take 600 mg by mouth 2 (two) times daily as needed for cough (congestion).  Marland Kitchen levothyroxine (SYNTHROID) 125 MCG tablet Take 1 tablet (125 mcg total)  by mouth daily at 6 (six) AM.  . NON FORMULARY Diet NAS  . OXYGEN Inhale 2 L into the lungs continuous.  . polyethylene glycol (MIRALAX / GLYCOLAX) 17 g packet Take 17 g by mouth daily.  Marland Kitchen Propylene Glycol-Glycerin 0.6-0.6 % SOLN Place 2 drops into both eyes every 8 (eight) hours as needed (for dry/irritated eyes.).  Marland Kitchen rosuvastatin (CRESTOR) 10 MG tablet Take 10 mg by mouth daily.  Marland Kitchen SPIRIVA HANDIHALER 18 MCG inhalation capsule Place 18 mcg into inhaler and inhale daily.    No facility-administered encounter medications on file as of 05/12/2020.  SIGNIFICANT DIAGNOSTIC EXAMS   PREVIOUS   04-20-20: right hand x-ray: Minimally displaced and angulated distal fifth metacarpal fracture. No intra-articular extension.  04-20-20: abdominal ultrasound: 1. Cirrhotic liver. 2. Small amount of free fluid.  04-21-20: ct of abdomen and pelvis:  1. Cirrhosis with signs of ascites, new from prior imaging. 2. Question of gastric thickening may represent gastritis or portal gastropathy. 3. Peribiliary hypoattenuation perhaps a combination of biliary cysts and biliary duct distension. Question of lesion within the biliary ducts in hepatic subsegment III where there is variable attenuation within the ducts best seen on image 36 of series 5. Stability argues against aggressive process but indolent biliaryneoplasm or small stones could have this  appearance. When the patient is clinically stable and able to follow directions and hold their breath (preferably as an outpatient) further evaluation with dedicated abdominal MRI/MRCP should be considered. 4. Hypoattenuating lesion in the genu of the pancreas measures 8 mm  and unchanged since 2019 and not associated with main duct distension or inflammation. 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 Consider follow-up in 1 year with MRI. 5. Aortic atherosclerosis.  04-22-20: chest x-ray: Mild left midlung subsegmental atelectasis  or scarring.Aortic Atherosclerosis  NO NEW EXAMS.    LABS REVIEWED PREVIOUS    04-20-20: wbc 3.6; hgb 11.5; hct 34.4; mcv 94.8 plt 163; glucose 89; bun 15; creat 1.40; k+ 4.1; na++ 130; ca 8.9 GFR 51; ast 146; alt 57; albumin 3.9; tsh 290.685 04-22-20: wbc 4.5; hgb 11.5; hct 39.5; mcv 94.7 plt 157; glucose 118; bun 16; creat 1.37; k+ 3.9; na++ 130 ca 8.8 GFR 53; ast 127; alt 49; albumin 3.7; vit B 12: 645; iron 108; tibc 298  NO NEW LABS.   Review of Systems  Constitutional: Negative for malaise/fatigue.  Respiratory: Negative for cough and shortness of breath.   Cardiovascular: Negative for chest pain, palpitations and leg swelling.  Gastrointestinal: Negative for abdominal pain, constipation and heartburn.  Musculoskeletal: Negative for back pain, joint pain and myalgias.  Skin: Negative.   Neurological: Negative for dizziness.  Psychiatric/Behavioral: The patient is not nervous/anxious.     Physical Exam Constitutional:      General: He is not in acute distress.    Appearance: He is well-developed. He is not diaphoretic.  Neck:     Thyroid: No thyromegaly.  Cardiovascular:     Rate and Rhythm: Normal rate and regular rhythm.     Pulses: Normal pulses.     Heart sounds: Murmur heard.    Pulmonary:     Effort: Pulmonary effort is normal. No respiratory distress.     Breath sounds: Normal breath sounds.  Abdominal:     General: Bowel sounds are normal. There is no distension.     Palpations: Abdomen is soft.     Tenderness: There is no abdominal tenderness.  Musculoskeletal:        General: Normal range of motion.     Cervical back: Neck supple.     Right lower leg: No edema.     Left lower leg: No edema.  Lymphadenopathy:     Cervical: No cervical adenopathy.  Skin:    General: Skin is warm and dry.     Comments: portacath   Neurological:     Mental Status: He is alert. Mental status is at baseline.  Psychiatric:        Mood and Affect: Mood normal.        ASSESSMENT/ PLAN:  TODAY  1.  Aortic atherosclerosis 2. Other emphysema 3. Severe hypothyroidism  Will continue current medications Will continue current plan of care Will continue therapy as directed Will check tsh free t4  His goal of care is assisted living.   Time spent with patient and family 35 minutes: coordination and counseling >50% of time to include goals of care disease processes; therapy treatments.    Ok Edwards NP [facility]  Contact [phone] Monday through Friday 8am- 5pm  After hours call [phone]

## 2020-05-13 ENCOUNTER — Other Ambulatory Visit (HOSPITAL_COMMUNITY)
Admission: RE | Admit: 2020-05-13 | Discharge: 2020-05-13 | Disposition: A | Discharge status: Home / Self Care | Payer: PPO | Source: Skilled Nursing Facility | Attending: Adult Health | Admitting: Adult Health

## 2020-05-13 LAB — TSH: TSH: 17.277 u[IU]/mL — ABNORMAL HIGH (ref 0.350–4.500)

## 2020-05-13 LAB — T4, FREE: Free T4: 1.28 ng/dL — ABNORMAL HIGH (ref 0.61–1.12)

## 2020-05-17 ENCOUNTER — Other Ambulatory Visit (HOSPITAL_COMMUNITY)
Admission: RE | Admit: 2020-05-17 | Discharge: 2020-05-17 | Disposition: A | Discharge status: Home / Self Care | Payer: PPO | Source: Skilled Nursing Facility | Attending: Internal Medicine | Admitting: Internal Medicine

## 2020-05-17 ENCOUNTER — Non-Acute Institutional Stay (SKILLED_NURSING_FACILITY): Payer: PPO | Admitting: Adult Health

## 2020-05-17 ENCOUNTER — Encounter: Payer: Self-pay | Admitting: Adult Health

## 2020-05-17 DIAGNOSIS — K7030 Alcoholic cirrhosis of liver without ascites: Secondary | ICD-10-CM | POA: Diagnosis not present

## 2020-05-17 DIAGNOSIS — L03114 Cellulitis of left upper limb: Secondary | ICD-10-CM | POA: Diagnosis not present

## 2020-05-17 DIAGNOSIS — R29818 Other symptoms and signs involving the nervous system: Secondary | ICD-10-CM

## 2020-05-17 DIAGNOSIS — R4189 Other symptoms and signs involving cognitive functions and awareness: Secondary | ICD-10-CM | POA: Diagnosis not present

## 2020-05-17 LAB — CBC
HCT: 33.6 % — ABNORMAL LOW (ref 39.0–52.0)
Hemoglobin: 11 g/dL — ABNORMAL LOW (ref 13.0–17.0)
MCH: 33.7 pg (ref 26.0–34.0)
MCHC: 32.7 g/dL (ref 30.0–36.0)
MCV: 103.1 fL — ABNORMAL HIGH (ref 80.0–100.0)
Platelets: 209 10*3/uL (ref 150–400)
RBC: 3.26 MIL/uL — ABNORMAL LOW (ref 4.22–5.81)
RDW: 17.6 % — ABNORMAL HIGH (ref 11.5–15.5)
WBC: 4.4 10*3/uL (ref 4.0–10.5)
nRBC: 0 % (ref 0.0–0.2)

## 2020-05-17 LAB — AMMONIA: Ammonia: 19 umol/L (ref 9–35)

## 2020-05-17 LAB — COMPREHENSIVE METABOLIC PANEL
ALT: 22 U/L (ref 0–44)
AST: 31 U/L (ref 15–41)
Albumin: 4.3 g/dL (ref 3.5–5.0)
Alkaline Phosphatase: 88 U/L (ref 38–126)
Anion gap: 7 (ref 5–15)
BUN: 31 mg/dL — ABNORMAL HIGH (ref 8–23)
CO2: 32 mmol/L (ref 22–32)
Calcium: 9.1 mg/dL (ref 8.9–10.3)
Chloride: 96 mmol/L — ABNORMAL LOW (ref 98–111)
Creatinine, Ser: 1.02 mg/dL (ref 0.61–1.24)
GFR, Estimated: >60 mL/min (ref >60)
Glucose, Bld: 69 mg/dL — ABNORMAL LOW (ref 70–99)
Potassium: 5.3 mmol/L — ABNORMAL HIGH (ref 3.5–5.1)
Sodium: 135 mmol/L (ref 135–145)
Total Bilirubin: 0.9 mg/dL (ref 0.3–1.2)
Total Protein: 7.4 g/dL (ref 6.5–8.1)

## 2020-05-17 NOTE — Progress Notes (Signed)
Location:  [place] Room Number: 111/W Place of Service:  SNF (31)   CODE STATUS: DNR  No Known Allergies  Chief Complaint  Patient presents with  . Acute Visit    Change in Status    HPI:  Staff report that he is having periods of time with increased confusion and increased difficulty focusing. He is experiencing some sleepiness in the daytime. He does have increased difficulty performing tasks upon command.  On his left forearm he has a red; hot raised area. He states that the area is painful and itchy. He knows where he is and when as well. He does have a history of cirrhosis.   Past Medical History:  Diagnosis Date  . Anxiety   . Arthritis   . Cancer ([place])   . Cirrhosis ([place])    confirmed by MRI on 07/04/11.    Marland Kitchen COPD (chronic obstructive pulmonary disease) ([place])   . Depression with anxiety   . ETOH abuse   . Hyperlipidemia   . Hypertension    diet control  . Nodule of upper lobe of right lung    with mediastinal adenopathy  . Wears dentures   . Wears glasses     Past Surgical History:  Procedure Laterality Date  . bilateral cataract surgery     [place]  . broken arm  6 yrs ago   left otif of wrist-[facility]  . CLOSED REDUCTION WRIST FRACTURE Right 09/02/2015   Procedure: RIGHT WRIST REDUCTION;  Surgeon: Renette Butters, MD;  Location: [place];  Service: Orthopedics;  Laterality: Right;  with MAC  . COLONOSCOPY  1996   Rehman: external hemorrhoids, no polyps  . COLONOSCOPY  06/28/2011   Dr. Ala Bent diverticulosis,tubular adenoma, hyperplastic polyp  . COLONOSCOPY WITH PROPOFOL N/A 04/26/2017   Dr. Gala Romney: perianal and digital rectal examinations normal, diffusely congested colonic mucosa but otherwise normal  . ESOPHAGOGASTRODUODENOSCOPY  06/28/2011   Dr. Chelsea Aus erosive reflux esophagitis, hiatal hernia-gastritis  . ESOPHAGOGASTRODUODENOSCOPY (EGD) WITH PROPOFOL N/A 04/26/2017   Dr. Gala Romney: normal esophagus, small hiatal hernia,  GAVE, portal hypertensive gastropathy, normal duodenal bulb and second portion, no specimens collected. 2 years screening   . ESOPHAGOGASTRODUODENOSCOPY (EGD) WITH PROPOFOL N/A 06/19/2019   normal esophagus, portal gastropathy, GAVE, normal duodenum. 2 years screening.   Marland Kitchen EXTERNAL EAR SURGERY     right ear-cleaned out ear and created new eardrum  . INGUINAL HERNIA REPAIR  2-3 yrs ago   left-[place]  . INTRAMEDULLARY (IM) NAIL INTERTROCHANTERIC Right 09/02/2015   Procedure: RIGHT  INTERTROCHANTRIC HIP;  Surgeon: Renette Butters, MD;  Location: [facility];  Service: Orthopedics;  Laterality: Right;  With MAC  . KNEE SURGERY     left-arthroscopy-[place]  . PORTACATH PLACEMENT Left 06/08/2017   Procedure: INSERTION POWER PORT WITH  ATTACHED CATHETER LEFT SUBCLAVIAN;  Surgeon: Aviva Signs, MD;  [facility];  Service: General;  Laterality: Left;  . SKULL FRACTURE ELEVATION     fractured cheeck bone repaired  . TOTAL HIP ARTHROPLASTY  12/18/2011   Procedure: TOTAL HIP ARTHROPLASTY;  Surgeon: Carole Civil, MD;  [facility];  Service: Orthopedics;  Laterality: Left;  Marland Kitchen VIDEO BRONCHOSCOPY WITH ENDOBRONCHIAL ULTRASOUND N/A 05/04/2017   Procedure: VIDEO BRONCHOSCOPY WITH ENDOBRONCHIAL ULTRASOUND;  Surgeon: Melrose Nakayama, MD;  Location: [place];  Service: Thoracic;  Laterality: N/A;  . WISDOM TOOTH EXTRACTION      Social History   Socioeconomic History  . Marital status: Married    Spouse  name: Not on file  . Number of children: Not on file  . Years of education: Not on file  . Highest education level: Not on file  Occupational History  . Occupation: retired    Fish farm manager: [employer]  Tobacco Use  . Smoking status: Former Smoker    Packs/day: 0.00    Years: 62.00    Pack years: 0.00    Types: Cigarettes    Quit date: 02/28/2019    Years since quitting: 1.2  . Smokeless tobacco: Never Used  . Tobacco comment: 1/2 ppd > 60 years as of 06/27/17: 4-5 cigarettes  or less a day  Vaping Use  . Vaping Use: Never used  Substance and Sexual Activity  . Alcohol use: Not Currently    Comment: stopped 4 years ago  . Drug use: No  . Sexual activity: Yes    Birth control/protection: None  Other Topics Concern  . Not on file  Social History Narrative  . Not on file   Social Determinants of Health   Financial Resource Strain: Not on file  Food Insecurity: Not on file  Transportation Needs: Not on file  Physical Activity: Not on file  Stress: Not on file  Social Connections: Not on file  Intimate Partner Violence: Not on file   Family History  Problem Relation Age of Onset  . Stroke Mother 31  . Heart disease Father 55       MI  . Diabetes Maternal Uncle   . Colon cancer Neg Hx   . Cancer Neg Hx       VITAL SIGNS BP (!) 106/49   Pulse 63   Temp 97.6 F (36.4 C)   Resp 20   Ht _0  (1.803 m)   Wt 181 lb 3.2 oz (82.2 kg)   SpO2 95%   BMI 25.27 kg/m   Outpatient Encounter Medications as of 05/17/2020  Medication Sig  . albuterol (VENTOLIN HFA) 108 (90 Base) MCG/ACT inhaler Inhale 2 puffs into the lungs every 8 (eight) hours as needed.  . cholecalciferol (VITAMIN D) 1000 units tablet Take 1,000 Units by mouth daily.  Marland Kitchen docusate sodium (COLACE) 100 MG capsule Take 1 capsule (100 mg total) by mouth 2 (two) times daily.  Marland Kitchen doxycycline (VIBRAMYCIN) 100 MG capsule Take 100 mg by mouth 2 (two) times daily.  . feeding supplement (ENSURE ENLIVE / ENSURE PLUS) LIQD Take 237 mLs by mouth 2 (two) times daily between meals.  Marland Kitchen guaiFENesin (MUCINEX) 600 MG 12 hr tablet Take 600 mg by mouth 2 (two) times daily as needed for cough (congestion).  Marland Kitchen levothyroxine (SYNTHROID) 125 MCG tablet Take 1 tablet (125 mcg total) by mouth daily at 6 (six) AM.  . NON FORMULARY Diet NAS  . OXYGEN Inhale 2 L into the lungs continuous.  . polyethylene glycol (MIRALAX / GLYCOLAX) 17 g packet Take 17 g by mouth daily.  Marland Kitchen Propylene Glycol-Glycerin 0.6-0.6 % SOLN  Place 2 drops into both eyes every 8 (eight) hours as needed (for dry/irritated eyes.).  Marland Kitchen rosuvastatin (CRESTOR) 10 MG tablet Take 10 mg by mouth daily.  Marland Kitchen triamcinolone (KENALOG) 0.025 % cream Apply 1 application topically 2 (two) times daily. Special Instructions: apply to face and arms sparingly for dryness. Twice A Day  . umeclidinium bromide (INCRUSE ELLIPTA) 62.5 MCG/INH AEPB Inhale 1 puff into the lungs daily.  . [DISCONTINUED] SPIRIVA HANDIHALER 18 MCG inhalation capsule Place 18 mcg into inhaler and inhale daily.    No  facility-administered encounter medications on file as of 05/17/2020.     SIGNIFICANT DIAGNOSTIC EXAMS   PREVIOUS   04-20-20: right hand x-ray: Minimally displaced and angulated distal fifth metacarpal fracture. No intra-articular extension.  04-20-20: abdominal ultrasound: 1. Cirrhotic liver. 2. Small amount of free fluid.  04-21-20: ct of abdomen and pelvis:  1. Cirrhosis with signs of ascites, new from prior imaging. 2. Question of gastric thickening may represent gastritis or portal gastropathy. 3. Peribiliary hypoattenuation perhaps a combination of biliary cysts and biliary duct distension. Question of lesion within the biliary ducts in hepatic subsegment III where there is variable attenuation within the ducts best seen on image 36 of series 5. Stability argues against aggressive process but indolent biliaryneoplasm or small stones could have this  appearance. When the patient is clinically stable and able to follow directions and hold their breath (preferably as an outpatient) further evaluation with dedicated abdominal MRI/MRCP should be considered. 4. Hypoattenuating lesion in the genu of the pancreas measures 8 mm  and unchanged since 2019 and not associated with main duct distension or inflammation. 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 Consider follow-up in 1 year with MRI. 5. Aortic atherosclerosis.  04-22-20:  chest x-ray: Mild left midlung subsegmental atelectasis or scarring.Aortic Atherosclerosis  NO NEW EXAMS.    LABS REVIEWED PREVIOUS    04-20-20: wbc 3.6; hgb 11.5; hct 34.4; mcv 94.8 plt 163; glucose 89; bun 15; creat 1.40; k+ 4.1; na++ 130; ca 8.9 GFR 51; ast 146; alt 57; albumin 3.9; tsh 290.685 04-22-20: wbc 4.5; hgb 11.5; hct 39.5; mcv 94.7 plt 157; glucose 118; bun 16; creat 1.37; k+ 3.9; na++ 130 ca 8.8 GFR 53; ast 127; alt 49; albumin 3.7; vit B 12: 645; iron 108; tibc 298  NO NEW LABS.   Review of Systems  Constitutional: Negative for malaise/fatigue.  Respiratory: Negative for cough and shortness of breath.   Cardiovascular: Negative for chest pain, palpitations and leg swelling.  Gastrointestinal: Negative for abdominal pain, constipation and heartburn.  Musculoskeletal: Negative for back pain, joint pain and myalgias.  Skin: Negative.   Neurological: Negative for dizziness.  Psychiatric/Behavioral: The patient is not nervous/anxious.     Physical Exam Constitutional:      General: He is not in acute distress.    Appearance: He is well-developed. He is not diaphoretic.  Neck:     Thyroid: No thyromegaly.  Cardiovascular:     Rate and Rhythm: Normal rate and regular rhythm.     Pulses: Normal pulses.     Heart sounds: Normal heart sounds.  Pulmonary:     Effort: Pulmonary effort is normal. No respiratory distress.     Breath sounds: Normal breath sounds.  Abdominal:     General: Bowel sounds are normal. There is no distension.     Palpations: Abdomen is soft.     Tenderness: There is no abdominal tenderness.  Musculoskeletal:        General: Normal range of motion.     Cervical back: Neck supple.     Right lower leg: No edema.     Left lower leg: No edema.  Lymphadenopathy:     Cervical: No cervical adenopathy.  Skin:    General: Skin is warm and dry.  Neurological:     Mental Status: He is alert. Mental status is at baseline.     Cranial Nerves: No cranial  nerve deficit.     Comments: He knows where he is; he knows  time as well.   Psychiatric:        Mood and Affect: Mood normal.        ASSESSMENT/ PLAN:  TODAY  1. Alcoholic cirrhosis without ascites 2. Neurocognitive deficits 3. Left forearm cellulitis   Will check cbc; cmp ammonia level Will begin doxycyline 100 mg twice daily through 05-26-20.  More than likely this change is representative of progression of his cognitive deficiency      Ok Edwards NP [facility] Adult Medicine  Contact [phone] Monday through Friday 8am- 5pm  After hours call [phone]

## 2020-05-21 ENCOUNTER — Other Ambulatory Visit: Payer: Self-pay | Admitting: Adult Health

## 2020-05-21 ENCOUNTER — Encounter: Payer: Self-pay | Admitting: Adult Health

## 2020-05-21 ENCOUNTER — Non-Acute Institutional Stay (SKILLED_NURSING_FACILITY): Payer: PPO | Admitting: Adult Health

## 2020-05-21 DIAGNOSIS — K838 Other specified diseases of biliary tract: Secondary | ICD-10-CM

## 2020-05-21 DIAGNOSIS — J441 Chronic obstructive pulmonary disease with (acute) exacerbation: Secondary | ICD-10-CM

## 2020-05-21 DIAGNOSIS — J9611 Chronic respiratory failure with hypoxia: Secondary | ICD-10-CM | POA: Diagnosis not present

## 2020-05-21 DIAGNOSIS — K7030 Alcoholic cirrhosis of liver without ascites: Secondary | ICD-10-CM

## 2020-05-21 MED ORDER — ROSUVASTATIN CALCIUM 10 MG PO TABS: 10.0000 mg | ORAL_TABLET | Freq: Every day | ORAL | 0 refills | Status: DC

## 2020-05-21 MED ORDER — DOXYCYCLINE HYCLATE 100 MG PO CAPS: 100.0000 mg | ORAL_CAPSULE | Freq: Two times a day (BID) | ORAL | 0 refills | Status: AC

## 2020-05-21 MED ORDER — INCRUSE ELLIPTA 62.5 MCG/INH IN AEPB
1.0000 | INHALATION_SPRAY | Freq: Every day | RESPIRATORY_TRACT | 0 refills | Status: DC
Start: 2020-05-21 — End: 2020-06-29

## 2020-05-21 MED ORDER — ALBUTEROL SULFATE HFA 108 (90 BASE) MCG/ACT IN AERS: 2.0000 | INHALATION_SPRAY | Freq: Three times a day (TID) | RESPIRATORY_TRACT | 0 refills | Status: DC | PRN

## 2020-05-21 MED ORDER — LEVOTHYROXINE SODIUM 125 MCG PO TABS
125.0000 ug | ORAL_TABLET | Freq: Every day | ORAL | 0 refills | Status: DC
Start: 2020-05-21 — End: 2020-10-05

## 2020-05-21 MED ORDER — TRIAMCINOLONE ACETONIDE 0.025 % EX CREA: 1.0000 "application " | TOPICAL_CREAM | Freq: Two times a day (BID) | CUTANEOUS | 0 refills | Status: DC

## 2020-05-21 NOTE — Progress Notes (Signed)
Location:   [facility] Room Number: 111/W Place of Service:  SNF (31)    CODE STATUS: dnr  No Known Allergies  Chief Complaint  Patient presents with  . Discharge Note    Discharge Visit     HPI:  He is being discharged to assisted living. He will not need any dme. He will need his prescriptions written and will need to follow up with her medical provider. He had been hospitalized for severe hypothyroidism and respiratory failure. He has participated in pt/ot/st.  He is unable to return back home; will need assisted living.    Past Medical History:  Diagnosis Date  . Anxiety   . Arthritis   . Cancer ([place])   . Cirrhosis ([place])    confirmed by MRI on 07/04/11.    Marland Kitchen COPD (chronic obstructive pulmonary disease) ([place])   . Depression with anxiety   . ETOH abuse   . Hyperlipidemia   . Hypertension    diet control  . Nodule of upper lobe of right lung    with mediastinal adenopathy  . Wears dentures   . Wears glasses     Past Surgical History:  Procedure Laterality Date  . bilateral cataract surgery     [place]  . broken arm  6 yrs ago   left otif of wrist-[facility]  . CLOSED REDUCTION WRIST FRACTURE Right 09/02/2015   Procedure: RIGHT WRIST REDUCTION;  Surgeon: Renette Butters, MD;  Location: [place];  Service: Orthopedics;  Laterality: Right;  with MAC  . COLONOSCOPY  1996   [facility]: external hemorrhoids, no polyps  . COLONOSCOPY  06/28/2011   Dr. Ala Bent diverticulosis,tubular adenoma, hyperplastic polyp  . COLONOSCOPY WITH PROPOFOL N/A 04/26/2017   Dr. Gala Romney: perianal and digital rectal examinations normal, diffusely congested colonic mucosa but otherwise normal  . ESOPHAGOGASTRODUODENOSCOPY  06/28/2011   Dr. Chelsea Aus erosive reflux esophagitis, hiatal hernia-gastritis  . ESOPHAGOGASTRODUODENOSCOPY (EGD) WITH PROPOFOL N/A 04/26/2017   Dr. Gala Romney: normal esophagus, small hiatal hernia, GAVE, portal hypertensive gastropathy, normal duodenal bulb and  second portion, no specimens collected. 2 years screening   . ESOPHAGOGASTRODUODENOSCOPY (EGD) WITH PROPOFOL N/A 06/19/2019   normal esophagus, portal gastropathy, GAVE, normal duodenum. 2 years screening.   Marland Kitchen EXTERNAL EAR SURGERY     right ear-cleaned out ear and created new eardrum  . INGUINAL HERNIA REPAIR  2-3 yrs ago   left-[facility]  . INTRAMEDULLARY (IM) NAIL INTERTROCHANTERIC Right 09/02/2015   Procedure: RIGHT  INTERTROCHANTRIC HIP;  Surgeon: Renette Butters, MD;  Location: [place];  Service: Orthopedics;  Laterality: Right;  With MAC  . KNEE SURGERY     left-arthroscopy-[place]  . PORTACATH PLACEMENT Left 06/08/2017   Procedure: INSERTION POWER PORT WITH  ATTACHED CATHETER LEFT SUBCLAVIAN;  Surgeon: Aviva Signs, MD;  Location: [facility];  Service: General;  Laterality: Left;  . SKULL FRACTURE ELEVATION     fractured cheeck bone repaired  . TOTAL HIP ARTHROPLASTY  12/18/2011   Procedure: TOTAL HIP ARTHROPLASTY;  Surgeon: Carole Civil, MD;  [facility];  Service: Orthopedics;  Laterality: Left;  Marland Kitchen VIDEO BRONCHOSCOPY WITH ENDOBRONCHIAL ULTRASOUND N/A 05/04/2017   Procedure: VIDEO BRONCHOSCOPY WITH ENDOBRONCHIAL ULTRASOUND;  Surgeon: Melrose Nakayama, MD;  Location: [place];  Service: Thoracic;  Laterality: N/A;  . WISDOM TOOTH EXTRACTION      Social History   Socioeconomic History  . Marital status: Married    Spouse name: Not on file  . Number of children: Not on  file  . Years of education: Not on file  . Highest education level: Not on file  Occupational History  . Occupation: retired    Fish farm manager: [employer]  Tobacco Use  . Smoking status: Former Smoker    Packs/day: 0.00    Years: 62.00    Pack years: 0.00    Types: Cigarettes    Quit date: 02/28/2019    Years since quitting: 1.2  . Smokeless tobacco: Never Used  . Tobacco comment: 1/2 ppd > 60 years as of 06/27/17: 4-5 cigarettes or less a day  Vaping Use  . Vaping Use: Never used  Substance  and Sexual Activity  . Alcohol use: Not Currently    Comment: stopped 4 years ago  . Drug use: No  . Sexual activity: Yes    Birth control/protection: None  Other Topics Concern  . Not on file  Social History Narrative  . Not on file   Social Determinants of Health   Financial Resource Strain: Not on file  Food Insecurity: Not on file  Transportation Needs: Not on file  Physical Activity: Not on file  Stress: Not on file  Social Connections: Not on file  Intimate Partner Violence: Not on file   Family History  Problem Relation Age of Onset  . Stroke Mother 72  . Heart disease Father 50       MI  . Diabetes Maternal Uncle   . Colon cancer Neg Hx   . Cancer Neg Hx     VITAL SIGNS BP (!) 106/49   Pulse 63   Temp 98.4 F (36.9 C)   Resp 20   Ht _0  (1.803 m)   Wt 181 lb 3.2 oz (82.2 kg)   SpO2 94%   BMI 25.27 kg/m   Patient's Medications  New Prescriptions   No medications on file  Previous Medications   ALBUTEROL (VENTOLIN HFA) 108 (90 BASE) MCG/ACT INHALER    Inhale 2 puffs into the lungs every 8 (eight) hours as needed.   CHOLECALCIFEROL (VITAMIN D) 1000 UNITS TABLET    Take 1,000 Units by mouth daily.   DOCUSATE SODIUM (COLACE) 100 MG CAPSULE    Take 1 capsule (100 mg total) by mouth 2 (two) times daily.   DOXYCYCLINE (VIBRAMYCIN) 100 MG CAPSULE    Take 1 capsule (100 mg total) by mouth 2 (two) times daily for 2 days.   FEEDING SUPPLEMENT (ENSURE ENLIVE / ENSURE PLUS) LIQD    Take 237 mLs by mouth 2 (two) times daily between meals.   GUAIFENESIN (MUCINEX) 600 MG 12 HR TABLET    Take 600 mg by mouth 2 (two) times daily as needed for cough (congestion).   LEVOTHYROXINE (SYNTHROID) 125 MCG TABLET    Take 1 tablet (125 mcg total) by mouth daily at 6 (six) AM.   NON FORMULARY    Diet NAS   OXYGEN    Inhale 2 L into the lungs continuous.   POLYETHYLENE GLYCOL (MIRALAX / GLYCOLAX) 17 G PACKET    Take 17 g by mouth daily.   PROPYLENE GLYCOL-GLYCERIN 0.6-0.6 % SOLN     Place 2 drops into both eyes every 8 (eight) hours as needed (for dry/irritated eyes.).   ROSUVASTATIN (CRESTOR) 10 MG TABLET    Take 1 tablet (10 mg total) by mouth daily.   TRIAMCINOLONE (KENALOG) 0.025 % CREAM    Apply 1 application topically 2 (two) times daily. Special Instructions: apply to face and arms sparingly for dryness.  Twice A Day   UMECLIDINIUM BROMIDE (INCRUSE ELLIPTA) 62.5 MCG/INH AEPB    Inhale 1 puff into the lungs daily.  Modified Medications   No medications on file  Discontinued Medications   No medications on file     SIGNIFICANT DIAGNOSTIC EXAMS   PREVIOUS   04-20-20: right hand x-ray: Minimally displaced and angulated distal fifth metacarpal fracture. No intra-articular extension.  04-20-20: abdominal ultrasound: 1. Cirrhotic liver. 2. Small amount of free fluid.  04-21-20: ct of abdomen and pelvis:  1. Cirrhosis with signs of ascites, new from prior imaging. 2. Question of gastric thickening may represent gastritis or portal gastropathy. 3. Peribiliary hypoattenuation perhaps a combination of biliary cysts and biliary duct distension. Question of lesion within the biliary ducts in hepatic subsegment III where there is variable attenuation within the ducts best seen on image 36 of series 5. Stability argues against aggressive process but indolent biliaryneoplasm or small stones could have this  appearance. When the patient is clinically stable and able to follow directions and hold their breath (preferably as an outpatient) further evaluation with dedicated abdominal MRI/MRCP should be considered. 4. Hypoattenuating lesion in the genu of the pancreas measures 8 mm  and unchanged since 2019 and not associated with main duct distension or inflammation. 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 Consider follow-up in 1 year with MRI. 5. Aortic atherosclerosis.  04-22-20: chest x-ray: Mild left midlung subsegmental atelectasis or  scarring.Aortic Atherosclerosis  NO NEW EXAMS.    LABS REVIEWED PREVIOUS    04-20-20: wbc 3.6; hgb 11.5; hct 34.4; mcv 94.8 plt 163; glucose 89; bun 15; creat 1.40; k+ 4.1; na++ 130; ca 8.9 GFR 51; ast 146; alt 57; albumin 3.9; tsh 290.685 04-22-20: wbc 4.5; hgb 11.5; hct 39.5; mcv 94.7 plt 157; glucose 118; bun 16; creat 1.37; k+ 3.9; na++ 130 ca 8.8 GFR 53; ast 127; alt 49; albumin 3.7; vit B 12: 645; iron 108; tibc 298  NO NEW LABS.   Review of Systems  Constitutional: Negative for malaise/fatigue.  Respiratory: Negative for cough and shortness of breath.   Cardiovascular: Negative for chest pain, palpitations and leg swelling.  Gastrointestinal: Negative for abdominal pain, constipation and heartburn.  Musculoskeletal: Negative for back pain, joint pain and myalgias.  Skin: Negative.   Neurological: Negative for dizziness.  Psychiatric/Behavioral: The patient is not nervous/anxious.     Physical Exam Constitutional:      General: He is not in acute distress.    Appearance: He is well-developed. He is not diaphoretic.  Neck:     Thyroid: No thyromegaly.  Cardiovascular:     Rate and Rhythm: Normal rate and regular rhythm.     Pulses: Normal pulses.     Heart sounds: Normal heart sounds.  Pulmonary:     Effort: Pulmonary effort is normal. No respiratory distress.     Breath sounds: Normal breath sounds.     Comments: 02 Abdominal:     General: Bowel sounds are normal. There is no distension.     Palpations: Abdomen is soft.     Tenderness: There is no abdominal tenderness.  Musculoskeletal:        General: Normal range of motion.     Cervical back: Neck supple.     Right lower leg: No edema.     Left lower leg: No edema.  Lymphadenopathy:     Cervical: No cervical adenopathy.  Skin:    General: Skin is warm and dry.  Neurological:  Mental Status: He is alert. Mental status is at baseline.  Psychiatric:        Mood and Affect: Mood normal.       ASSESSMENT/  PLAN:   Patient is being discharged with the following home health services: pt/ot to evaluate and treat as indicated for gait balance strength adl training.    Patient is being discharged with the following durable medical equipment:  None needed   Patient has been advised to f/u with their PCP in 1-2 weeks to bring them up to date on their rehab stay.  Social services at facility was responsible for arranging this appointment.  Pt was provided with a 30 day supply of prescriptions for medications and refills must be obtained from their PCP.  For controlled substances, a more limited supply may be provided adequate until PCP appointment only.  A 30 day supply of his prescription medications [facility]  Time spent with patient: 35 minutes: dme; medications home health.     Ok Edwards NP [facility]  Contact [phone] Monday through Friday 8am- 5pm  After hours call [phone]

## 2020-05-21 NOTE — Progress Notes (Signed)
Location:    [facility] Room Number: 111/W Place of Service:  SNF (31)    CODE STATUS: DNR  No Known Allergies  Chief Complaint  Patient presents with  . Discharge Note    Discharge Visit     HPI:  Past Medical History:  Diagnosis Date  . Anxiety   . Arthritis   . Cancer ([place])   . Cirrhosis ([place])    confirmed by MRI on 07/04/11.    Marland Kitchen COPD (chronic obstructive pulmonary disease) ([place])   . Depression with anxiety   . ETOH abuse   . Hyperlipidemia   . Hypertension    diet control  . Nodule of upper lobe of right lung    with mediastinal adenopathy  . Wears dentures   . Wears glasses     Past Surgical History:  Procedure Laterality Date  . bilateral cataract surgery     [place]  . broken arm  6 yrs ago   left otif of wrist-[place]  . CLOSED REDUCTION WRIST FRACTURE Right 09/02/2015   Procedure: RIGHT WRIST REDUCTION;  Surgeon: Renette Butters, MD;  Location: [facility];  Service: Orthopedics;  Laterality: Right;  with MAC  . COLONOSCOPY  1996   [facility]: external hemorrhoids, no polyps  . COLONOSCOPY  06/28/2011   Dr. Ala Bent diverticulosis,tubular adenoma, hyperplastic polyp  . COLONOSCOPY WITH PROPOFOL N/A 04/26/2017   Dr. Gala Romney: perianal and digital rectal examinations normal, diffusely congested colonic mucosa but otherwise normal  . ESOPHAGOGASTRODUODENOSCOPY  06/28/2011   Dr. Chelsea Aus erosive reflux esophagitis, hiatal hernia-gastritis  . ESOPHAGOGASTRODUODENOSCOPY (EGD) WITH PROPOFOL N/A 04/26/2017   Dr. Gala Romney: normal esophagus, small hiatal hernia, GAVE, portal hypertensive gastropathy, normal duodenal bulb and second portion, no specimens collected. 2 years screening   . ESOPHAGOGASTRODUODENOSCOPY (EGD) WITH PROPOFOL N/A 06/19/2019   normal esophagus, portal gastropathy, GAVE, normal duodenum. 2 years screening.   Marland Kitchen EXTERNAL EAR SURGERY     right ear-cleaned out ear and created new eardrum  . INGUINAL HERNIA REPAIR  2-3 yrs ago    left-[facility]  . INTRAMEDULLARY (IM) NAIL INTERTROCHANTERIC Right 09/02/2015   Procedure: RIGHT  INTERTROCHANTRIC HIP;  Surgeon: Renette Butters, MD;  Location: [facility];  Service: Orthopedics;  Laterality: Right;  With MAC  . KNEE SURGERY     left-arthroscopy-[place]  . PORTACATH PLACEMENT Left 06/08/2017   Procedure: INSERTION POWER PORT WITH  ATTACHED CATHETER LEFT SUBCLAVIAN;  Surgeon: Aviva Signs, MD;  [facility];  Service: General;  Laterality: Left;  . SKULL FRACTURE ELEVATION     fractured cheeck bone repaired  . TOTAL HIP ARTHROPLASTY  12/18/2011   Procedure: TOTAL HIP ARTHROPLASTY;  Surgeon: Carole Civil, MD;  Location[facility];  Service: Orthopedics;  Laterality: Left;  Marland Kitchen VIDEO BRONCHOSCOPY WITH ENDOBRONCHIAL ULTRASOUND N/A 05/04/2017   Procedure: VIDEO BRONCHOSCOPY WITH ENDOBRONCHIAL ULTRASOUND;  Surgeon: Melrose Nakayama, MD;  Location: [facility];  Service: Thoracic;  Laterality: N/A;  . WISDOM TOOTH EXTRACTION      Social History   Socioeconomic History  . Marital status: Married    Spouse name: Not on file  . Number of children: Not on file  . Years of education: Not on file  . Highest education level: Not on file  Occupational History  . Occupation: retired    Fish farm manager: [employer]  Tobacco Use  . Smoking status: Former Smoker    Packs/day: 0.00    Years: 62.00    Pack years: 0.00  Types: Cigarettes    Quit date: 02/28/2019    Years since quitting: 1.2  . Smokeless tobacco: Never Used  . Tobacco comment: 1/2 ppd > 60 years as of 06/27/17: 4-5 cigarettes or less a day  Vaping Use  . Vaping Use: Never used  Substance and Sexual Activity  . Alcohol use: Not Currently    Comment: stopped 4 years ago  . Drug use: No  . Sexual activity: Yes    Birth control/protection: None  Other Topics Concern  . Not on file  Social History Narrative  . Not on file   Social Determinants of Health   Financial Resource Strain: Not on file  Food  Insecurity: Not on file  Transportation Needs: Not on file  Physical Activity: Not on file  Stress: Not on file  Social Connections: Not on file  Intimate Partner Violence: Not on file   Family History  Problem Relation Age of Onset  . Stroke Mother 65  . Heart disease Father 28       MI  . Diabetes Maternal Uncle   . Colon cancer Neg Hx   . Cancer Neg Hx     VITAL SIGNS BP (!) 106/49   Pulse 63   Temp 98.4 F (36.9 C)   Resp 20   Ht _0  (1.803 m)   Wt 181 lb 3.2 oz (82.2 kg)   SpO2 94%   BMI 25.27 kg/m   Patient's Medications  New Prescriptions   No medications on file  Previous Medications   ALBUTEROL (VENTOLIN HFA) 108 (90 BASE) MCG/ACT INHALER    Inhale 2 puffs into the lungs every 8 (eight) hours as needed.   CHOLECALCIFEROL (VITAMIN D) 1000 UNITS TABLET    Take 1,000 Units by mouth daily.   DOCUSATE SODIUM (COLACE) 100 MG CAPSULE    Take 1 capsule (100 mg total) by mouth 2 (two) times daily.   DOXYCYCLINE (VIBRAMYCIN) 100 MG CAPSULE    Take 1 capsule (100 mg total) by mouth 2 (two) times daily for 2 days.   FEEDING SUPPLEMENT (ENSURE ENLIVE / ENSURE PLUS) LIQD    Take 237 mLs by mouth 2 (two) times daily between meals.   GUAIFENESIN (MUCINEX) 600 MG 12 HR TABLET    Take 600 mg by mouth 2 (two) times daily as needed for cough (congestion).   LEVOTHYROXINE (SYNTHROID) 125 MCG TABLET    Take 1 tablet (125 mcg total) by mouth daily at 6 (six) AM.   NON FORMULARY    Diet NAS   OXYGEN    Inhale 2 L into the lungs continuous.   POLYETHYLENE GLYCOL (MIRALAX / GLYCOLAX) 17 G PACKET    Take 17 g by mouth daily.   PROPYLENE GLYCOL-GLYCERIN 0.6-0.6 % SOLN    Place 2 drops into both eyes every 8 (eight) hours as needed (for dry/irritated eyes.).   ROSUVASTATIN (CRESTOR) 10 MG TABLET    Take 1 tablet (10 mg total) by mouth daily.   UMECLIDINIUM BROMIDE (INCRUSE ELLIPTA) 62.5 MCG/INH AEPB    Inhale 1 puff into the lungs daily.  Modified Medications   No medications on file   Discontinued Medications   TRIAMCINOLONE (KENALOG) 0.025 % CREAM    Apply 1 application topically 2 (two) times daily. Special Instructions: apply to face and arms sparingly for dryness. Twice A Day     SIGNIFICANT DIAGNOSTIC EXAMS       ASSESSMENT/ PLAN:      Patient is being discharged with the  following home health services:    Patient is being discharged with the following durable medical equipment:    Patient has been advised to f/u with their PCP in 1-2 weeks to bring them up to date on their rehab stay.  Social services at facility was responsible for arranging this appointment.  Pt was provided with a 30 day supply of prescriptions for medications and refills must be obtained from their PCP.  For controlled substances, a more limited supply may be provided adequate until PCP appointment only.   Ok Edwards NP [facility]  Contact [phone] Monday through Friday 8am- 5pm  After hours call [phone]

## 2020-05-24 ENCOUNTER — Other Ambulatory Visit (HOSPITAL_COMMUNITY)
Admission: RE | Admit: 2020-05-24 | Discharge: 2020-05-24 | Disposition: A | Discharge status: Home / Self Care | Payer: PPO | Source: Skilled Nursing Facility | Attending: Adult Health | Admitting: Adult Health

## 2020-05-24 DIAGNOSIS — E875 Hyperkalemia: Secondary | ICD-10-CM | POA: Insufficient documentation

## 2020-05-24 LAB — POTASSIUM: Potassium: 4.2 mmol/L (ref 3.5–5.1)

## 2020-05-27 DIAGNOSIS — Z20828 Contact with and (suspected) exposure to other viral communicable diseases: Secondary | ICD-10-CM | POA: Diagnosis not present

## 2020-05-27 DIAGNOSIS — U071 COVID-19: Secondary | ICD-10-CM | POA: Diagnosis not present

## 2020-05-31 DIAGNOSIS — J449 Chronic obstructive pulmonary disease, unspecified: Secondary | ICD-10-CM | POA: Diagnosis not present

## 2020-05-31 DIAGNOSIS — M169 Osteoarthritis of hip, unspecified: Secondary | ICD-10-CM | POA: Diagnosis not present

## 2020-05-31 DIAGNOSIS — Z6825 Body mass index (BMI) 25.0-25.9, adult: Secondary | ICD-10-CM | POA: Diagnosis not present

## 2020-05-31 DIAGNOSIS — E063 Autoimmune thyroiditis: Secondary | ICD-10-CM | POA: Diagnosis not present

## 2020-05-31 DIAGNOSIS — L309 Dermatitis, unspecified: Secondary | ICD-10-CM | POA: Diagnosis not present

## 2020-06-01 DIAGNOSIS — Z20828 Contact with and (suspected) exposure to other viral communicable diseases: Secondary | ICD-10-CM | POA: Diagnosis not present

## 2020-06-01 DIAGNOSIS — U071 COVID-19: Secondary | ICD-10-CM | POA: Diagnosis not present

## 2020-06-06 DIAGNOSIS — J449 Chronic obstructive pulmonary disease, unspecified: Secondary | ICD-10-CM | POA: Diagnosis not present

## 2020-06-07 DIAGNOSIS — F32A Depression, unspecified: Secondary | ICD-10-CM | POA: Diagnosis not present

## 2020-06-07 DIAGNOSIS — Z6825 Body mass index (BMI) 25.0-25.9, adult: Secondary | ICD-10-CM | POA: Diagnosis not present

## 2020-06-07 DIAGNOSIS — E663 Overweight: Secondary | ICD-10-CM | POA: Diagnosis not present

## 2020-06-07 DIAGNOSIS — M6281 Muscle weakness (generalized): Secondary | ICD-10-CM | POA: Diagnosis not present

## 2020-06-07 DIAGNOSIS — J441 Chronic obstructive pulmonary disease with (acute) exacerbation: Secondary | ICD-10-CM | POA: Diagnosis not present

## 2020-06-07 DIAGNOSIS — E039 Hypothyroidism, unspecified: Secondary | ICD-10-CM | POA: Diagnosis not present

## 2020-06-07 DIAGNOSIS — J9611 Chronic respiratory failure with hypoxia: Secondary | ICD-10-CM | POA: Diagnosis not present

## 2020-06-07 DIAGNOSIS — K7031 Alcoholic cirrhosis of liver with ascites: Secondary | ICD-10-CM | POA: Diagnosis not present

## 2020-06-07 DIAGNOSIS — R627 Adult failure to thrive: Secondary | ICD-10-CM | POA: Diagnosis not present

## 2020-06-07 DIAGNOSIS — F0150 Vascular dementia without behavioral disturbance: Secondary | ICD-10-CM | POA: Diagnosis not present

## 2020-06-07 DIAGNOSIS — Z87891 Personal history of nicotine dependence: Secondary | ICD-10-CM | POA: Diagnosis not present

## 2020-06-07 DIAGNOSIS — I69318 Other symptoms and signs involving cognitive functions following cerebral infarction: Secondary | ICD-10-CM | POA: Diagnosis not present

## 2020-06-08 DIAGNOSIS — U071 COVID-19: Secondary | ICD-10-CM | POA: Diagnosis not present

## 2020-06-08 DIAGNOSIS — Z20828 Contact with and (suspected) exposure to other viral communicable diseases: Secondary | ICD-10-CM | POA: Diagnosis not present

## 2020-06-15 DIAGNOSIS — J9611 Chronic respiratory failure with hypoxia: Secondary | ICD-10-CM | POA: Diagnosis not present

## 2020-06-15 DIAGNOSIS — K7031 Alcoholic cirrhosis of liver with ascites: Secondary | ICD-10-CM | POA: Diagnosis not present

## 2020-06-15 DIAGNOSIS — I69318 Other symptoms and signs involving cognitive functions following cerebral infarction: Secondary | ICD-10-CM | POA: Diagnosis not present

## 2020-06-15 DIAGNOSIS — Z20828 Contact with and (suspected) exposure to other viral communicable diseases: Secondary | ICD-10-CM | POA: Diagnosis not present

## 2020-06-15 DIAGNOSIS — U071 COVID-19: Secondary | ICD-10-CM | POA: Diagnosis not present

## 2020-06-15 DIAGNOSIS — J441 Chronic obstructive pulmonary disease with (acute) exacerbation: Secondary | ICD-10-CM | POA: Diagnosis not present

## 2020-06-17 ENCOUNTER — Ambulatory Visit (HOSPITAL_COMMUNITY): Payer: PPO

## 2020-06-21 ENCOUNTER — Telehealth: Payer: Self-pay

## 2020-06-21 NOTE — Telephone Encounter (Signed)
VM received from pts daughter. They received lab orders for pt to complete labs this week. Pt's daughter has informed the nursing facility where pt resides that lab work is needed. Pt's contact information has been changed from pts spouse to his son. Pt's daughters info is also listed.

## 2020-06-22 ENCOUNTER — Ambulatory Visit (HOSPITAL_COMMUNITY)
Admission: RE | Admit: 2020-06-22 | Discharge: 2020-06-22 | Disposition: A | Discharge status: Home / Self Care | Payer: PPO | Source: Ambulatory Visit | Attending: Hematology | Admitting: Hematology

## 2020-06-22 ENCOUNTER — Inpatient Hospital Stay (HOSPITAL_COMMUNITY): Payer: PPO | Attending: Hematology

## 2020-06-22 ENCOUNTER — Other Ambulatory Visit: Payer: Self-pay

## 2020-06-22 ENCOUNTER — Encounter (HOSPITAL_COMMUNITY): Payer: Self-pay | Admitting: Radiology

## 2020-06-22 DIAGNOSIS — C3411 Malignant neoplasm of upper lobe, right bronchus or lung: Secondary | ICD-10-CM | POA: Diagnosis not present

## 2020-06-22 DIAGNOSIS — K7460 Unspecified cirrhosis of liver: Secondary | ICD-10-CM | POA: Diagnosis not present

## 2020-06-22 DIAGNOSIS — C3491 Malignant neoplasm of unspecified part of right bronchus or lung: Secondary | ICD-10-CM | POA: Diagnosis not present

## 2020-06-22 DIAGNOSIS — J9809 Other diseases of bronchus, not elsewhere classified: Secondary | ICD-10-CM | POA: Diagnosis not present

## 2020-06-22 DIAGNOSIS — J439 Emphysema, unspecified: Secondary | ICD-10-CM | POA: Diagnosis not present

## 2020-06-22 LAB — COMPREHENSIVE METABOLIC PANEL
ALT: 14 U/L (ref 0–44)
AST: 20 U/L (ref 15–41)
Albumin: 4.3 g/dL (ref 3.5–5.0)
Alkaline Phosphatase: 80 U/L (ref 38–126)
Anion gap: 9 (ref 5–15)
BUN: 21 mg/dL (ref 8–23)
CO2: 31 mmol/L (ref 22–32)
Calcium: 9.4 mg/dL (ref 8.9–10.3)
Chloride: 99 mmol/L (ref 98–111)
Creatinine, Ser: 1.16 mg/dL (ref 0.61–1.24)
GFR, Estimated: >60 mL/min (ref >60)
Glucose, Bld: 97 mg/dL (ref 70–99)
Potassium: 3.9 mmol/L (ref 3.5–5.1)
Sodium: 139 mmol/L (ref 135–145)
Total Bilirubin: 0.8 mg/dL (ref 0.3–1.2)
Total Protein: 7.2 g/dL (ref 6.5–8.1)

## 2020-06-22 LAB — CBC WITH DIFFERENTIAL/PLATELET
Abs Immature Granulocytes: 0.01 10*3/uL (ref 0.00–0.07)
Basophils Absolute: 0 10*3/uL (ref 0.0–0.1)
Basophils Relative: 0 %
Eosinophils Absolute: 0.1 10*3/uL (ref 0.0–0.5)
Eosinophils Relative: 2 %
HCT: 43.1 % (ref 39.0–52.0)
Hemoglobin: 13.8 g/dL (ref 13.0–17.0)
Immature Granulocytes: 0 %
Lymphocytes Relative: 16 %
Lymphs Abs: 0.8 10*3/uL (ref 0.7–4.0)
MCH: 32.7 pg (ref 26.0–34.0)
MCHC: 32 g/dL (ref 30.0–36.0)
MCV: 102.1 fL — ABNORMAL HIGH (ref 80.0–100.0)
Monocytes Absolute: 0.4 10*3/uL (ref 0.1–1.0)
Monocytes Relative: 8 %
Neutro Abs: 3.5 10*3/uL (ref 1.7–7.7)
Neutrophils Relative %: 74 %
Platelets: 182 10*3/uL (ref 150–400)
RBC: 4.22 MIL/uL (ref 4.22–5.81)
RDW: 13.1 % (ref 11.5–15.5)
WBC: 4.8 10*3/uL (ref 4.0–10.5)
nRBC: 0 % (ref 0.0–0.2)

## 2020-06-22 MED ORDER — IOHEXOL 300 MG/ML  SOLN
75.0000 mL | Freq: Once | INTRAMUSCULAR | Status: AC | PRN
  Administered 2020-06-22: 75 mL via INTRAVENOUS

## 2020-06-23 LAB — PROTIME-INR
INR: 1.1
Prothrombin Time: 10.8 s (ref 9.0–11.5)

## 2020-06-26 DIAGNOSIS — E039 Hypothyroidism, unspecified: Secondary | ICD-10-CM | POA: Diagnosis not present

## 2020-06-26 DIAGNOSIS — M169 Osteoarthritis of hip, unspecified: Secondary | ICD-10-CM | POA: Diagnosis not present

## 2020-06-26 DIAGNOSIS — J449 Chronic obstructive pulmonary disease, unspecified: Secondary | ICD-10-CM | POA: Diagnosis not present

## 2020-06-27 ENCOUNTER — Emergency Department (HOSPITAL_COMMUNITY): Payer: PPO

## 2020-06-27 ENCOUNTER — Emergency Department (HOSPITAL_COMMUNITY)
Admission: EM | Admit: 2020-06-27 | Discharge: 2020-06-27 | Disposition: A | Discharge status: Home / Self Care | Payer: PPO | Attending: Emergency Medicine | Admitting: Emergency Medicine

## 2020-06-27 ENCOUNTER — Encounter (HOSPITAL_COMMUNITY): Payer: Self-pay | Admitting: *Deleted

## 2020-06-27 ENCOUNTER — Other Ambulatory Visit: Payer: Self-pay

## 2020-06-27 DIAGNOSIS — J441 Chronic obstructive pulmonary disease with (acute) exacerbation: Secondary | ICD-10-CM | POA: Diagnosis not present

## 2020-06-27 DIAGNOSIS — Y92129 Unspecified place in nursing home as the place of occurrence of the external cause: Secondary | ICD-10-CM | POA: Insufficient documentation

## 2020-06-27 DIAGNOSIS — Z043 Encounter for examination and observation following other accident: Secondary | ICD-10-CM | POA: Diagnosis not present

## 2020-06-27 DIAGNOSIS — Z85118 Personal history of other malignant neoplasm of bronchus and lung: Secondary | ICD-10-CM | POA: Diagnosis not present

## 2020-06-27 DIAGNOSIS — R42 Dizziness and giddiness: Secondary | ICD-10-CM | POA: Diagnosis not present

## 2020-06-27 DIAGNOSIS — W01198A Fall on same level from slipping, tripping and stumbling with subsequent striking against other object, initial encounter: Secondary | ICD-10-CM | POA: Diagnosis not present

## 2020-06-27 DIAGNOSIS — Z20822 Contact with and (suspected) exposure to covid-19: Secondary | ICD-10-CM | POA: Insufficient documentation

## 2020-06-27 DIAGNOSIS — I10 Essential (primary) hypertension: Secondary | ICD-10-CM | POA: Diagnosis not present

## 2020-06-27 DIAGNOSIS — M25551 Pain in right hip: Secondary | ICD-10-CM | POA: Insufficient documentation

## 2020-06-27 DIAGNOSIS — E039 Hypothyroidism, unspecified: Secondary | ICD-10-CM | POA: Diagnosis not present

## 2020-06-27 DIAGNOSIS — Z87891 Personal history of nicotine dependence: Secondary | ICD-10-CM | POA: Diagnosis not present

## 2020-06-27 DIAGNOSIS — W19XXXA Unspecified fall, initial encounter: Secondary | ICD-10-CM | POA: Diagnosis not present

## 2020-06-27 DIAGNOSIS — R0902 Hypoxemia: Secondary | ICD-10-CM | POA: Diagnosis not present

## 2020-06-27 DIAGNOSIS — Z96642 Presence of left artificial hip joint: Secondary | ICD-10-CM | POA: Diagnosis not present

## 2020-06-27 LAB — CBC WITH DIFFERENTIAL/PLATELET
Abs Immature Granulocytes: 0.03 10*3/uL (ref 0.00–0.07)
Basophils Absolute: 0 10*3/uL (ref 0.0–0.1)
Basophils Relative: 0 %
Eosinophils Absolute: 0 10*3/uL (ref 0.0–0.5)
Eosinophils Relative: 0 %
HCT: 43.5 % (ref 39.0–52.0)
Hemoglobin: 14.1 g/dL (ref 13.0–17.0)
Immature Granulocytes: 0 %
Lymphocytes Relative: 4 %
Lymphs Abs: 0.4 10*3/uL — ABNORMAL LOW (ref 0.7–4.0)
MCH: 32.9 pg (ref 26.0–34.0)
MCHC: 32.4 g/dL (ref 30.0–36.0)
MCV: 101.4 fL — ABNORMAL HIGH (ref 80.0–100.0)
Monocytes Absolute: 0.7 10*3/uL (ref 0.1–1.0)
Monocytes Relative: 7 %
Neutro Abs: 8.4 10*3/uL — ABNORMAL HIGH (ref 1.7–7.7)
Neutrophils Relative %: 89 %
Platelets: 174 10*3/uL (ref 150–400)
RBC: 4.29 MIL/uL (ref 4.22–5.81)
RDW: 13 % (ref 11.5–15.5)
WBC: 9.5 10*3/uL (ref 4.0–10.5)
nRBC: 0 % (ref 0.0–0.2)

## 2020-06-27 LAB — COMPREHENSIVE METABOLIC PANEL
ALT: 15 U/L (ref 0–44)
AST: 23 U/L (ref 15–41)
Albumin: 4.3 g/dL (ref 3.5–5.0)
Alkaline Phosphatase: 81 U/L (ref 38–126)
Anion gap: 7 (ref 5–15)
BUN: 19 mg/dL (ref 8–23)
CO2: 32 mmol/L (ref 22–32)
Calcium: 9 mg/dL (ref 8.9–10.3)
Chloride: 97 mmol/L — ABNORMAL LOW (ref 98–111)
Creatinine, Ser: 0.92 mg/dL (ref 0.61–1.24)
GFR, Estimated: >60 mL/min (ref >60)
Glucose, Bld: 126 mg/dL — ABNORMAL HIGH (ref 70–99)
Potassium: 4 mmol/L (ref 3.5–5.1)
Sodium: 136 mmol/L (ref 135–145)
Total Bilirubin: 0.9 mg/dL (ref 0.3–1.2)
Total Protein: 7.3 g/dL (ref 6.5–8.1)

## 2020-06-27 LAB — URINALYSIS, ROUTINE W REFLEX MICROSCOPIC
Bacteria, UA: NONE SEEN
Bilirubin Urine: NEGATIVE
Glucose, UA: NEGATIVE mg/dL
Hgb urine dipstick: NEGATIVE
Ketones, ur: NEGATIVE mg/dL
Leukocytes,Ua: NEGATIVE
Nitrite: NEGATIVE
Protein, ur: 30 mg/dL — AB
Specific Gravity, Urine: 1.025 (ref 1.005–1.030)
pH: 5 (ref 5.0–8.0)

## 2020-06-27 LAB — RESP PANEL BY RT-PCR (FLU A&B, COVID) ARPGX2
Influenza A by PCR: NEGATIVE
Influenza B by PCR: NEGATIVE
SARS Coronavirus 2 by RT PCR: NEGATIVE

## 2020-06-27 LAB — AMMONIA: Ammonia: 17 umol/L (ref 9–35)

## 2020-06-27 NOTE — ED Triage Notes (Signed)
Pt sent here from [facility] due to a fall. Denies any pain. Uses a walker.

## 2020-06-27 NOTE — ED Provider Notes (Signed)
Emergency Department Provider Note   I have reviewed the triage vital signs and the nursing notes.   HISTORY  Chief Complaint Fall   HPI Ian Aguilar is a 80 y.o. male with past medical history reviewed below including COPD and cirrhosis presents to the emergency department from [facility] facility after 2 falls today.  Patient states "I just lost my balance and fell twice."  He states that he may have felt lightheaded before falling but is unsure exactly.  He denies any chest pain or SOB.  He states after the first and second falls he had some pain in the right hip although denies anything there currently.  He did strike the back of his head according to the patient.  He uses a walker to ambulate and apparently has been ambulatory since the falls but according to EMS his son wanted him evaluated after the falls.  He notes that he is been eating and drinking well. No complaints at this time.   Past Medical History:  Diagnosis Date  . Anxiety   . Arthritis   . Cancer ([place])   . Cirrhosis ([place])    confirmed by MRI on 07/04/11.    Marland Kitchen COPD (chronic obstructive pulmonary disease) ([place])   . Depression with anxiety   . ETOH abuse   . Hyperlipidemia   . Hypertension    diet control  . Nodule of upper lobe of right lung    with mediastinal adenopathy  . Wears dentures   . Wears glasses     Patient Active Problem List   Diagnosis Date Noted  . Neurocognitive deficits 04/29/2020  . Hyperlipidemia LDL goal <100 04/27/2020  . Metacarpal bone fracture 04/27/2020  . Aortic atherosclerosis ([place]) 04/27/2020  . Chronic constipation 04/27/2020  . Severe hypothyroidism 04/22/2020  . Distention of biliary tract 04/21/2020  . AKI (acute kidney injury) ([place]) 04/21/2020  . Hypothyroidism 04/21/2020  . Abdominal distension 04/20/2020  . Acute encephalopathy 10/23/2019  . Dehydration 10/23/2019  . COPD with acute exacerbation ([place]) 10/23/2019  . Chronic hypoxemic respiratory failure  ([place]) 10/23/2019  . Hypertensive urgency 10/23/2019  . Goals of care, counseling/discussion   . Palliative care by specialist   . DNR (do not resuscitate) discussion   . Malignant neoplasm of bronchus and lung ([place])   . Carcinoma, lung ([place]) 06/05/2017  . History of colonic polyps 04/06/2017  . History of snoring 09/07/2015  . Fracture of ulnar styloid   . Benign essential HTN   . Faintness   . Thrombocytopenia ([place]) 09/03/2015  . Hyponatremia 09/03/2015  . Closed displaced intertrochanteric fracture of right femur ([place])   . Distal radius fracture, right 09/02/2015  . Fracture of right hip requiring operative repair ([place]) 09/01/2015  . Alcohol abuse 09/01/2015  . Tobacco abuse 09/01/2015  . COPD (chronic obstructive pulmonary disease) 09/01/2015  . 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 Weakness of left leg 01/10/2012  . Difficulty in walking(719.7) 01/10/2012  . Pain in left hip 01/10/2012  . Acute blood loss anemia 12/21/2011  . Cirrhosis- imaging diagnosis 2013 09/08/2011  . Umbilical hernia 63/02/6008  . 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  . Hepatomegaly 06/14/2011  . Encounter for screening colonoscopy 06/14/2011  . JOINT EFFUSION, KNEE 02/10/2008  . KNEE PAIN 02/10/2008    Past Surgical History:  Procedure Laterality Date  . bilateral cataract surgery     Ian Aguilar  . broken arm  6 yrs ago   left otif of wrist-[place]  . CLOSED  REDUCTION WRIST FRACTURE Right 09/02/2015   Procedure: RIGHT WRIST REDUCTION;  Surgeon: Renette Butters, MD;  Location: [facility];  Service: Orthopedics;  Laterality: Right;  with MAC  . COLONOSCOPY  1996   Rehman: external hemorrhoids, no polyps  . COLONOSCOPY  06/28/2011   Dr. Ala Bent diverticulosis,tubular adenoma, hyperplastic polyp  . COLONOSCOPY WITH PROPOFOL N/A 04/26/2017   Dr. Gala Romney: perianal and digital rectal examinations normal, diffusely congested colonic mucosa but otherwise normal  . ESOPHAGOGASTRODUODENOSCOPY  06/28/2011   Dr.  Chelsea Aus erosive reflux esophagitis, hiatal hernia-gastritis  . ESOPHAGOGASTRODUODENOSCOPY (EGD) WITH PROPOFOL N/A 04/26/2017   Dr. Gala Romney: normal esophagus, small hiatal hernia, GAVE, portal hypertensive gastropathy, normal duodenal bulb and second portion, no specimens collected. 2 years screening   . ESOPHAGOGASTRODUODENOSCOPY (EGD) WITH PROPOFOL N/A 06/19/2019   normal esophagus, portal gastropathy, GAVE, normal duodenum. 2 years screening.   Marland Kitchen EXTERNAL EAR SURGERY     right ear-cleaned out ear and created new eardrum  . INGUINAL HERNIA REPAIR  2-3 yrs ago   left-[facility]  . INTRAMEDULLARY (IM) NAIL INTERTROCHANTERIC Right 09/02/2015   Procedure: RIGHT  INTERTROCHANTRIC HIP;  Surgeon: Renette Butters, MD;  Location: [facility];  Service: Orthopedics;  Laterality: Right;  With MAC  . KNEE SURGERY     left-arthroscopy-[place]  . PORTACATH PLACEMENT Left 06/08/2017   Procedure: INSERTION POWER PORT WITH  ATTACHED CATHETER LEFT SUBCLAVIAN;  Surgeon: Aviva Signs, MD;  [facility];  Service: General;  Laterality: Left;  . SKULL FRACTURE ELEVATION     fractured cheeck bone repaired  . TOTAL HIP ARTHROPLASTY  12/18/2011   Procedure: TOTAL HIP ARTHROPLASTY;  Surgeon: Carole Civil, MD;  [facility];  Service: Orthopedics;  Laterality: Left;  Marland Kitchen VIDEO BRONCHOSCOPY WITH ENDOBRONCHIAL ULTRASOUND N/A 05/04/2017   Procedure: VIDEO BRONCHOSCOPY WITH ENDOBRONCHIAL ULTRASOUND;  Surgeon: Melrose Nakayama, MD;  Location: [facility];  Service: Thoracic;  Laterality: N/A;  . WISDOM TOOTH EXTRACTION      Allergies Patient has no known allergies.  Family History  Problem Relation Age of Onset  . Stroke Mother 24  . Heart disease Father 51       MI  . Diabetes Maternal Uncle   . Colon cancer Neg Hx   . Cancer Neg Hx     Social History Social History   Tobacco Use  . Smoking status: Former Smoker    Packs/day: 0.00    Years: 62.00    Pack years: 0.00    Types: Cigarettes    Quit  date: 02/28/2019    Years since quitting: 1.3  . Smokeless tobacco: Never Used  . Tobacco comment: 1/2 ppd > 60 years as of 06/27/17: 4-5 cigarettes or less a day  Vaping Use  . Vaping Use: Never used  Substance Use Topics  . Alcohol use: Not Currently    Comment: stopped 4 years ago  . Drug use: No    Review of Systems  Constitutional: No fever/chills Eyes: No visual changes. ENT: No sore throat. Cardiovascular: Denies chest pain. Respiratory: Denies shortness of breath. Gastrointestinal: No abdominal pain. No nausea, no vomiting. No diarrhea. No constipation. Genitourinary: Negative for dysuria. Musculoskeletal: Negative for back pain. Skin: Negative for rash. Neurological: Negative for focal weakness or numbness. Positive head injury.   10-point ROS otherwise negative.  ____________________________________________   PHYSICAL EXAM:  VITAL SIGNS: ED Triage Vitals  Enc Vitals Group     BP 06/27/20 1733 (!) 165/96     Pulse Rate 06/27/20 1733  79     Resp 06/27/20 1733 15     Temp 06/27/20 1733 98.6 F (37 C)     Temp Source 06/27/20 1733 Oral     SpO2 06/27/20 1733 100 %     Weight 06/27/20 1730 181 lb 3.5 oz (82.2 kg)     Height 06/27/20 1730 _0  (1.803 m)   Constitutional: Alert and oriented. Well appearing and in no acute distress. Eyes: Conjunctivae are normal. PERRL. Head: Atraumatic. Nose: No congestion/rhinnorhea. Mouth/Throat: Mucous membranes are moist. Neck: No stridor. No cervical spine tenderness to palpation. Cardiovascular: Normal rate, regular rhythm. Good peripheral circulation. Grossly normal heart sounds.   Respiratory: Normal respiratory effort.  No retractions. Lungs CTAB. Gastrointestinal: Soft and nontender. Positive distention (chronic).  Musculoskeletal: No gross deformities of extremities. Normal ROM of the bilateral upper and lower extremities.  Neurologic:  Normal speech and language. Normal strength and sensation in arms/legs.  Skin:   Skin is warm, dry and intact. No rash noted.   ____________________________________________   LABS (all labs ordered are listed, but only abnormal results are displayed)  Labs Reviewed  COMPREHENSIVE METABOLIC PANEL - Abnormal; Notable for the following components:      Result Value   Chloride 97 (*)    Glucose, Bld 126 (*)    All other components within normal limits  CBC WITH DIFFERENTIAL/PLATELET - Abnormal; Notable for the following components:   MCV 101.4 (*)    Neutro Abs 8.4 (*)    Lymphs Abs 0.4 (*)    All other components within normal limits  URINALYSIS, ROUTINE W REFLEX MICROSCOPIC - Abnormal; Notable for the following components:   Protein, ur 30 (*)    All other components within normal limits  RESP PANEL BY RT-PCR (FLU A&B, COVID) ARPGX2  AMMONIA   ____________________________________________  EKG   EKG Interpretation  Date/Time:  Sunday Jun 27 2020 18:40:02 EDT Ventricular Rate:  80 PR Interval:  218 QRS Duration: 124 QT Interval:  404 QTC Calculation: 465 R Axis:   90 Text Interpretation: Sinus rhythm with 1st degree A-V block Right bundle branch block Abnormal ECG No significant change since last tracing Confirmed by Nanda Quinton [phone]) on 06/27/2020 7:03:45 PM       ____________________________________________  RADIOLOGY  CT Head Wo Contrast  Result Date: 06/27/2020 CLINICAL DATA:  [place] EXAM: CT HEAD WITHOUT CONTRAST TECHNIQUE: Contiguous axial images were obtained from the base of the skull through the vertex without intravenous contrast. COMPARISON:  10/22/2019 FINDINGS: Brain: No acute infarct or hemorrhage. Lateral ventricles and midline structures appear unremarkable. No acute extra-axial fluid collections. No mass effect. Vascular: No hyperdense vessel or unexpected calcification. Skull: Normal. Negative for fracture or focal lesion. Sinuses/Orbits: No acute finding. Other: None. IMPRESSION: 1. No acute intracranial process. Electronically Signed    By: Randa Ngo M.D.   On: 06/27/2020 17:59   DG Hip Unilat W or Wo Pelvis 2-3 Views Right  Result Date: 06/27/2020 CLINICAL DATA:  Acute RIGHT hip pain following fall. Initial encounter. EXAM: DG HIP (WITH OR WITHOUT PELVIS) 2-3V RIGHT COMPARISON:  03/14/2016 FINDINGS: No acute fracture or dislocation identified. Internal rod/screw fixation within the RIGHT femur is noted traversing a remote fracture. LEFT hip arthroplasty changes are identified. No focal bony lesions are present. Vascular calcifications are again noted. IMPRESSION: 1. No evidence of acute abnormality. 2. Prior ORIF of the RIGHT femur. LEFT hip arthroplasty. Electronically Signed   By: Margarette Canada M.D.   On: 06/27/2020 18:15  ____________________________________________   PROCEDURES  Procedure(s) performed:   Procedures  None ____________________________________________   INITIAL IMPRESSION / ASSESSMENT AND PLAN / ED COURSE  Pertinent labs & imaging results that were available during my care of the patient were reviewed by me and considered in my medical decision making (see chart for details).   Patient presents to the emergency department for evaluation after 2 falls today at his nursing facility.  He has no current symptoms but initially described to me that he may be had some lightheadedness prior to falling.  Will obtain lab work and EKG in the setting.  He has no outward sign of head trauma but given his age and cirrhosis I will obtain a CT scan of the head to rule out fracture or bleed although suspicion of this is very low.  He is also complaining of some right hip pain after falling but none currently.  Given the conflicting history plan for plain film of the right hip and reassess.  Labs reviewed. Ammonia normal. UA without infection. Patient imaging reviewed. Plan for discharge back to SNF. Discussed results and plan with son Jeneen Rinks by phone.  ____________________________________________  FINAL CLINICAL  IMPRESSION(S) / ED DIAGNOSES  Final diagnoses:  Fall, initial encounter    Note:  This document was prepared using Dragon voice recognition software and may include unintentional dictation errors.  Nanda Quinton, MD, [facility]    Daziyah Cogan, [facility], MD 06/28/20 1340

## 2020-06-27 NOTE — Discharge Instructions (Signed)
You were seen in the emergency room today after a fall.  CT scan of your head along with x-ray of your hip were normal.  Perform blood work including ammonia and urine testing is also were normal.  Please follow with your primary care doctor.  Be sure to use your walker when ambulating.  Return to the emergency department any new or suddenly worsening symptoms.

## 2020-06-27 NOTE — ED Notes (Signed)
Update given to [facility]

## 2020-06-28 DIAGNOSIS — K7031 Alcoholic cirrhosis of liver with ascites: Secondary | ICD-10-CM | POA: Diagnosis not present

## 2020-06-28 DIAGNOSIS — E039 Hypothyroidism, unspecified: Secondary | ICD-10-CM | POA: Diagnosis not present

## 2020-06-28 DIAGNOSIS — Z87891 Personal history of nicotine dependence: Secondary | ICD-10-CM | POA: Diagnosis not present

## 2020-06-28 DIAGNOSIS — J441 Chronic obstructive pulmonary disease with (acute) exacerbation: Secondary | ICD-10-CM | POA: Diagnosis not present

## 2020-06-28 DIAGNOSIS — M6281 Muscle weakness (generalized): Secondary | ICD-10-CM | POA: Diagnosis not present

## 2020-06-28 DIAGNOSIS — J9611 Chronic respiratory failure with hypoxia: Secondary | ICD-10-CM | POA: Diagnosis not present

## 2020-06-28 DIAGNOSIS — I69318 Other symptoms and signs involving cognitive functions following cerebral infarction: Secondary | ICD-10-CM | POA: Diagnosis not present

## 2020-06-28 DIAGNOSIS — F0150 Vascular dementia without behavioral disturbance: Secondary | ICD-10-CM | POA: Diagnosis not present

## 2020-06-29 ENCOUNTER — Other Ambulatory Visit (HOSPITAL_COMMUNITY): Payer: Self-pay | Admitting: Physician Assistant

## 2020-06-29 ENCOUNTER — Other Ambulatory Visit: Payer: Self-pay

## 2020-06-29 ENCOUNTER — Inpatient Hospital Stay (HOSPITAL_COMMUNITY): Payer: PPO

## 2020-06-29 ENCOUNTER — Ambulatory Visit (HOSPITAL_COMMUNITY)
Admission: RE | Admit: 2020-06-29 | Discharge: 2020-06-29 | Disposition: A | Discharge status: Home / Self Care | Payer: PPO | Source: Ambulatory Visit | Attending: Physician Assistant | Admitting: Physician Assistant

## 2020-06-29 ENCOUNTER — Inpatient Hospital Stay (HOSPITAL_COMMUNITY): Payer: PPO | Attending: Hematology | Admitting: Hematology

## 2020-06-29 VITALS — BP 146/61 | HR 91 | Temp 97.0°F | Resp 22

## 2020-06-29 DIAGNOSIS — Z20828 Contact with and (suspected) exposure to other viral communicable diseases: Secondary | ICD-10-CM | POA: Diagnosis not present

## 2020-06-29 DIAGNOSIS — C3411 Malignant neoplasm of upper lobe, right bronchus or lung: Secondary | ICD-10-CM | POA: Diagnosis not present

## 2020-06-29 DIAGNOSIS — E039 Hypothyroidism, unspecified: Secondary | ICD-10-CM | POA: Insufficient documentation

## 2020-06-29 DIAGNOSIS — U071 COVID-19: Secondary | ICD-10-CM | POA: Diagnosis not present

## 2020-06-29 DIAGNOSIS — C3491 Malignant neoplasm of unspecified part of right bronchus or lung: Secondary | ICD-10-CM

## 2020-06-29 DIAGNOSIS — R0602 Shortness of breath: Secondary | ICD-10-CM | POA: Diagnosis not present

## 2020-06-29 DIAGNOSIS — G629 Polyneuropathy, unspecified: Secondary | ICD-10-CM | POA: Diagnosis not present

## 2020-06-29 DIAGNOSIS — R059 Cough, unspecified: Secondary | ICD-10-CM

## 2020-06-29 DIAGNOSIS — I700 Atherosclerosis of aorta: Secondary | ICD-10-CM | POA: Diagnosis not present

## 2020-06-29 MED ORDER — SODIUM CHLORIDE 0.9% FLUSH
10.0000 mL | INTRAVENOUS | Status: DC | PRN
Start: 2020-06-29 — End: 2020-06-30
  Administered 2020-06-29: 10 mL via INTRAVENOUS

## 2020-06-29 MED ORDER — HEPARIN SOD (PORK) LOCK FLUSH 100 UNIT/ML IV SOLN
500.0000 [IU] | Freq: Once | INTRAVENOUS | Status: AC
  Administered 2020-06-29: 500 [IU] via INTRAVENOUS

## 2020-06-29 NOTE — Patient Instructions (Signed)
[place] at [facility] Discharge Instructions  You were seen today by Dr. Delton Coombes. He went over your recent results and scans. You will be scheduled to have a CT scan of your chest done before your next visit. Dr. Delton Coombes will see you back in 6 months for labs and follow up.   Thank you for choosing [place] at [facility] to provide your oncology and hematology care.  To afford each patient quality time with our provider, please arrive at least 15 minutes before your scheduled appointment time.   If you have a lab appointment with the [place] please come in thru the Main Entrance and check in at the main information desk  You need to re-schedule your appointment should you arrive 10 or more minutes late.  We strive to give you quality time with our providers, and arriving late affects you and other patients whose appointments are after yours.  Also, if you no show three or more times for appointments you may be dismissed from the clinic at the providers discretion.     Again, thank you for choosing [facility].  Our hope is that these requests will decrease the amount of time that you wait before being seen by our physicians.       _____________________________________________________________  Should you have questions after your visit to [facility], please contact our office at [phone] between the hours of 8:00 a.m. and 4:30 p.m.  Voicemails left after 4:00 p.m. will not be returned until the following business day.  For prescription refill requests, have your pharmacy contact our office and allow 72 hours.    Cancer Center Support Programs:   > Cancer Support Group  2nd Tuesday of the month 1pm-2pm, [facility]

## 2020-06-29 NOTE — Progress Notes (Signed)
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 Chemoradiation with carboplatin and paclitaxel weekly from 06/11/2017 to 07/16/2017. 2. Consolidation with durvalumab from 09/19/2017 to 09/17/2018.  NGS Results: Not done  CURRENT THERAPY: Surveillance  BRIEF ONCOLOGIC HISTORY:  Oncology History  Carcinoma, lung ([facility])  06/05/2017 Initial Diagnosis   Carcinoma, lung ([facility])   06/11/2017 - 07/16/2017 Chemotherapy   The patient had dexamethasone (DECADRON) 4 MG tablet, 8 mg, Oral, Daily, 1 of 1 cycle, Start date: --, End date: -- palonosetron (ALOXI) injection 0.25 mg, 0.25 mg, Intravenous,  Once, 6 of 6 cycles Administration: 0.25 mg (06/11/2017), 0.25 mg (07/09/2017), 0.25 mg (07/16/2017), 0.25 mg (06/18/2017), 0.25 mg (06/25/2017), 0.25 mg (07/02/2017) CARBOplatin (PARAPLATIN) 190 mg in sodium chloride 0.9 % 250 mL chemo infusion, 190 mg (100 % of original dose 192.8 mg), Intravenous,  Once, 6 of 6 cycles Dose modification:   (original dose 192.8 mg, Cycle 1),   (original dose 192.8 mg, Cycle 5), 144.6 mg (original dose 144.6 mg, Cycle 6),   (original dose 192.8 mg, Cycle 2),   (original dose 192.8 mg, Cycle 3),   (original dose 192.8 mg, Cycle 4) Administration: 190 mg (06/11/2017), 190 mg (07/09/2017), 140 mg (07/16/2017), 190 mg (06/18/2017), 190 mg (06/25/2017), 190 mg (07/02/2017) PACLitaxel (TAXOL) 90 mg in dextrose 5 % 250 mL chemo infusion (</= 80m/m2), 45 mg/m2 = 90 mg, Intravenous,  Once, 6 of 6 cycles Dose modification: 40.5 mg/m2 (90 % of original dose 45 mg/m2, Cycle 5, Reason: Other (see comments), Comment: early neuropathy), 36 mg/m2 (80 % of original dose 45 mg/m2, Cycle 6, Reason: Provider Judgment) Administration: 90 mg (06/11/2017), 78 mg  (07/09/2017), 72 mg (07/16/2017), 90 mg (06/18/2017), 78 mg (06/25/2017), 78 mg (07/02/2017)  for chemotherapy treatment.    [place]us and lung ([facility]  06/08/2017 Initial Diagnosis   [place]us and lung ([place]   09/20/2017 -  Chemotherapy   The patient had durvalumab (IMFINZI) 860 mg in sodium chloride 0.9 % 100 mL chemo infusion, 800 mg, Intravenous,  Once, 26 of 26 cycles Administration: 860 mg (09/20/2017), 860 mg (10/04/2017), 860 mg (10/18/2017), 860 mg (11/01/2017), 860 mg (11/15/2017), 860 mg (11/29/2017), 860 mg (12/13/2017), 860 mg (12/27/2017), 860 mg (01/10/2018), 860 mg (01/28/2018), 860 mg (02/11/2018), 860 mg (02/25/2018), 860 mg (03/11/2018), 860 mg (03/25/2018), 860 mg (04/08/2018), 860 mg (04/22/2018), 860 mg (05/08/2018), 860 mg (05/27/2018), 860 mg (06/10/2018), 860 mg (06/25/2018), 860 mg (07/09/2018), 860 mg (07/23/2018), 860 mg (08/06/2018), 860 mg (08/20/2018), 860 mg (09/03/2018), 860 mg (09/17/2018)  for chemotherapy treatment.      CANCER STAGING: Cancer Staging No matching staging information was found for the patient.  INTERVAL HISTORY:  Ian Aguilar a 80y.o. male, returns for routine follow-up of his stage II right upper lobe adenocarcinoma. [name] last seen on 12/23/2019.   Today he is accompanied by his caretaker and he reports feeling poorly. [facility] [place] [facility] and requires help to bathe him. He is going to PT to recover strength.   REVIEW OF SYSTEMS:  Review of Systems  Constitutional: Positive for appetite change (depleted) and fatigue (depleted).  All other systems reviewed and are negative.   PAST MEDICAL/SURGICAL HISTORY:  Past Medical History:  Diagnosis Date  . Anxiety   . Arthritis   . Cancer ([facility])   . Cirrhosis ([facility])    confirmed by MRI on 07/04/11.    Marland Kitchen COPD (chronic obstructive pulmonary disease) ([facility])   . Depression with anxiety   . ETOH abuse   . Hyperlipidemia   . Hypertension    diet  control  . Nodule of upper lobe of right lung    with mediastinal adenopathy  . Wears dentures   . Wears glasses    Past Surgical History:  Procedure Laterality Date  . bilateral cataract surgery     [place]  . broken arm  6 yrs ago   left otif of wrist-[facility]  . CLOSED REDUCTION WRIST FRACTURE Right 09/02/2015   Procedure: RIGHT WRIST REDUCTION;  Surgeon: Renette Butters, MD;  Location: [facility];  Service: Orthopedics;  Laterality: Right;  with MAC  . COLONOSCOPY  1996   [facility]: external hemorrhoids, no polyps  . COLONOSCOPY  06/28/2011   Dr. Ala Bent diverticulosis,tubular adenoma, hyperplastic polyp  . COLONOSCOPY WITH PROPOFOL N/A 04/26/2017   Dr. Gala Romney: perianal and digital rectal examinations normal, diffusely congested colonic mucosa but otherwise normal  . ESOPHAGOGASTRODUODENOSCOPY  06/28/2011   Dr. Chelsea Aus erosive reflux esophagitis, hiatal hernia-gastritis  . ESOPHAGOGASTRODUODENOSCOPY (EGD) WITH PROPOFOL N/A 04/26/2017   Dr. Gala Romney: normal esophagus, small hiatal hernia, GAVE, portal hypertensive gastropathy, normal duodenal bulb and second portion, no specimens collected. 2 years screening   . ESOPHAGOGASTRODUODENOSCOPY (EGD) WITH PROPOFOL N/A 06/19/2019   normal esophagus, portal gastropathy, GAVE, normal duodenum. 2 years screening.   Marland Kitchen EXTERNAL EAR SURGERY     right ear-cleaned out ear and created new eardrum  . INGUINAL HERNIA REPAIR  2-3 yrs ago   left-[facility]_  . INTRAMEDULLARY (IM) NAIL INTERTROCHANTERIC Right 09/02/2015   Procedure: RIGHT  INTERTROCHANTRIC HIP;  Surgeon: Renette Butters, MD;  Location: [facility];  Service: Orthopedics;  Laterality: Right;  With MAC  . KNEE SURGERY     left-arthroscopy-[facility]  . PORTACATH PLACEMENT Left 06/08/2017   Procedure: INSERTION POWER PORT WITH  ATTACHED CATHETER LEFT SUBCLAVIAN;  Surgeon: Aviva Signs, MD;  Location: [facility];  Service: General;  Laterality: Left;  . SKULL FRACTURE ELEVATION     fractured cheeck bone  repaired  . TOTAL HIP ARTHROPLASTY  12/18/2011   Procedure: TOTAL HIP ARTHROPLASTY;  Surgeon: Carole Civil, MD;  Location: [facility];  Service: Orthopedics;  Laterality: Left;  Marland Kitchen VIDEO BRONCHOSCOPY WITH ENDOBRONCHIAL ULTRASOUND N/A 05/04/2017   Procedure: VIDEO BRONCHOSCOPY WITH ENDOBRONCHIAL ULTRASOUND;  Surgeon: Melrose Nakayama, MD;  Location: [facility];  Service: Thoracic;  Laterality: N/A;  . WISDOM TOOTH EXTRACTION      SOCIAL HISTORY:  Social History   Socioeconomic History  . Marital status: Widowed    Spouse name: Not on file  . Number of children: Not on file  . Years of education: Not on file  . Highest education level: Not on file  Occupational History  . Occupation: retired    Fish farm manager: [employer]  Tobacco Use  . Smoking status: Former Smoker    Packs/day: 0.00    Years: 62.00    Pack years: 0.00    Types: Cigarettes    Quit date: 02/28/2019    Years since quitting: 1.3  . Smokeless tobacco: Never Used  . Tobacco comment: 1/2 ppd > 60 years as of 06/27/17: 4-5 cigarettes or less a day  Vaping Use  . Vaping  Use: Never used  Substance and Sexual Activity  . Alcohol use: Not Currently    Comment: stopped 4 years ago  . Drug use: No  . Sexual activity: Yes    Birth control/protection: None  Other Topics Concern  . Not on file  Social History Narrative  . Not on file   Social Determinants of Health   Financial Resource Strain: Not on file  Food Insecurity: Not on file  Transportation Needs: Not on file  Physical Activity: Not on file  Stress: Not on file  Social Connections: Not on file  Intimate Partner Violence: Not on file    FAMILY HISTORY:  Family History  Problem Relation Age of Onset  . Stroke Mother 81  . Heart disease Father 49       MI  . Diabetes Maternal Uncle   . Colon cancer Neg Hx   . Cancer Neg Hx     CURRENT MEDICATIONS:  Current Outpatient Medications  Medication Sig Dispense Refill  . albuterol (VENTOLIN HFA)  108 (90 Base) MCG/ACT inhaler Inhale 2 puffs into the lungs every 8 (eight) hours as needed. 1 each 0  . cholecalciferol (VITAMIN D) 1000 units tablet Take 1,000 Units by mouth daily.    Marland Kitchen docusate sodium (COLACE) 100 MG capsule Take 1 capsule (100 mg total) by mouth 2 (two) times daily. 60 capsule 2  . GOODSENSE ARTIFICIAL TEARS 0.5-0.6 % SOLN Apply to eye.    Marland Kitchen guaiFENesin (MUCINEX) 600 MG 12 hr tablet Take 600 mg by mouth 2 (two) times daily as needed for cough (congestion).    Marland Kitchen levothyroxine (SYNTHROID) 125 MCG tablet Take 1 tablet (125 mcg total) by mouth daily at 6 (six) AM. 30 tablet 0  . NON FORMULARY Diet NAS    . OXYGEN Inhale 2 L into the lungs continuous.    . polyethylene glycol (MIRALAX / GLYCOLAX) 17 g packet Take 17 g by mouth daily. 14 each 0  . rosuvastatin (CRESTOR) 10 MG tablet Take 1 tablet (10 mg total) by mouth daily. 30 tablet 0  . sertraline (ZOLOFT) 50 MG tablet Take 1 tablet by mouth daily.    Marland Kitchen SPIRIVA RESPIMAT 2.5 MCG/ACT AERS Inhale 2 puffs into the lungs daily.    Marland Kitchen triamcinolone (KENALOG) 0.025 % ointment Apply 1 application topically 2 (two) times daily. Apply to face and arms sparingly for dryness    . feeding supplement (ENSURE ENLIVE / ENSURE PLUS) LIQD Take 237 mLs by mouth 2 (two) times daily between meals. (Patient not taking: No sig reported)     No current facility-administered medications for this visit.   Facility-Administered Medications Ordered in Other Visits  Medication Dose Route Frequency Provider Last Rate Last Admin  . sodium chloride flush (NS) 0.9 % injection 10 mL  10 mL Intravenous PRN Derek Jack, MD   10 mL at 06/29/20 1530    ALLERGIES:  No Known Allergies  PHYSICAL EXAM:  Performance status (ECOG): 1 - Symptomatic but completely ambulatory  Vitals:   06/29/20 1509  BP: (!) 146/61  Pulse: 91  Resp: (!) 22  Temp: (!) 97 F (36.1 C)  SpO2: 91%   Wt Readings from Last 3 Encounters:  06/27/20 181 lb 3.5 oz (82.2 kg)   05/21/20 181 lb 3.2 oz (82.2 kg)  05/17/20 181 lb 3.2 oz (82.2 kg)   Physical Exam Vitals reviewed.  Constitutional:      Appearance: Normal appearance.     Interventions: Nasal cannula in place.  Comments: In wheelchair  Cardiovascular:     Rate and Rhythm: Normal rate and regular rhythm.     Pulses: Normal pulses.     Heart sounds: Normal heart sounds.  Pulmonary:     Effort: Pulmonary effort is normal.     Breath sounds: Normal breath sounds.  Neurological:     General: No focal deficit present.     Mental Status: He is alert and oriented to person, place, and time.  Psychiatric:        Mood and Affect: Mood normal.        Behavior: Behavior normal.      LABORATORY DATA:  I have reviewed the labs as listed.  CBC Latest Ref Rng & Units 06/27/2020 06/22/2020 05/17/2020  WBC 4.0 - 10.5 K/uL 9.5 4.8 4.4  Hemoglobin 13.0 - 17.0 g/dL 14.1 13.8 11.0(L)  Hematocrit 39.0 - 52.0 % 43.5 43.1 33.6(L)  Platelets 150 - 400 K/uL 174 182 209   CMP Latest Ref Rng & Units 06/27/2020 06/22/2020 05/24/2020  Glucose 70 - 99 mg/dL 126(H) 97 -  BUN 8 - 23 mg/dL 19 21 -  Creatinine 0.61 - 1.24 mg/dL 0.92 1.16 -  Sodium 135 - 145 mmol/L 136 139 -  Potassium 3.5 - 5.1 mmol/L 4.0 3.9 4.2  Chloride 98 - 111 mmol/L 97(L) 99 -  CO2 22 - 32 mmol/L 32 31 -  Calcium 8.9 - 10.3 mg/dL 9.0 9.4 -  Total Protein 6.5 - 8.1 g/dL 7.3 7.2 -  Total Bilirubin 0.3 - 1.2 mg/dL 0.9 0.8 -  Alkaline Phos 38 - 126 U/L 81 80 -  AST 15 - 41 U/L 23 20 -  ALT 0 - 44 U/L 15 14 -    DIAGNOSTIC IMAGING:  I have independently reviewed the scans and discussed with the patient. CT Head Wo Contrast  Result Date: 06/27/2020 CLINICAL DATA:  Ian Aguilar EXAM: CT HEAD WITHOUT CONTRAST TECHNIQUE: Contiguous axial images were obtained from the base of the skull through the vertex without intravenous contrast. COMPARISON:  10/22/2019 FINDINGS: Brain: No acute infarct or hemorrhage. Lateral ventricles and midline structures appear  unremarkable. No acute extra-axial fluid collections. No mass effect. Vascular: No hyperdense vessel or unexpected calcification. Skull: Normal. Negative for fracture or focal lesion. Sinuses/Orbits: No acute finding. Other: None. IMPRESSION: 1. No acute intracranial process. Electronically Signed   By: Randa Ngo M.D.   On: 06/27/2020 17:59   CT Chest W Contrast  Result Date: 06/22/2020 CLINICAL DATA:  Follow-up right upper lobe adenocarcinoma, status post chemotherapy and radiation EXAM: CT CHEST WITH CONTRAST TECHNIQUE: Multidetector CT imaging of the chest was performed during intravenous contrast administration. CONTRAST:  50m OMNIPAQUE IOHEXOL 300 MG/ML  SOLN COMPARISON:  12/16/2019, 06/16/2019 FINDINGS: Cardiovascular: Left chest port catheter. Aortic atherosclerosis. Normal heart size. Three-vessel coronary artery calcifications. No pericardial effusion. Mediastinum/Nodes: No enlarged mediastinal, hilar, or axillary lymph nodes. Thyroid gland, trachea, and esophagus demonstrate no significant findings. Lungs/Pleura: Diffuse bilateral bronchial wall thickening. Mild centrilobular emphysema. Unchanged subpleural nodule of the posterior right pulmonary apex measuring 0.9 x 0.9 cm (series 4, image 35). Minimal radiation fibrosis of the paramedian right upper lobe. Unchanged bandlike scarring of the posterior lingula (series 4, image 105). There is a rounded ground-glass opacity of the central left upper lobe measuring 1.4 x 1.4 cm, which is increased in conspicuity compared to prior examinations although generally stable in size (series 4, image 56). Scattered tiny, stable and definitively benign small centrilobular pulmonary nodules. No pleural effusion  or pneumothorax. Upper Abdomen: No acute abnormality. Cirrhotic morphology of the liver in the included upper abdomen. Musculoskeletal: No chest wall mass or suspicious bone lesions identified. IMPRESSION: 1. Unchanged subpleural nodule of the posterior  right pulmonary apex measuring 0.9 x 0.9 cm. 2. Minimal radiation fibrosis of the paramedian right upper lobe. 3. There is a rounded ground-glass opacity of the central left upper lobe measuring 1.4 cm, increased in conspicuity compared to prior examinations although stable in size. This is nonspecific; which may be infectious or inflammatory nature but indolent adenocarcinoma is not excluded. Continued attention on follow-up. 4. Diffuse bilateral bronchial wall thickening, consistent with nonspecific infectious or inflammatory bronchitis. 5. Emphysema. 6. Coronary artery disease. 7. Cirrhosis. Aortic Atherosclerosis (ICD10-I70.0) and Emphysema (ICD10-J43.9). Electronically Signed   By: Eddie Candle M.D.   On: 06/22/2020 15:43   DG Hip Unilat W or Wo Pelvis 2-3 Views Right  Result Date: 06/27/2020 CLINICAL DATA:  Acute RIGHT hip pain following fall. Initial encounter. EXAM: DG HIP (WITH OR WITHOUT PELVIS) 2-3V RIGHT COMPARISON:  03/14/2016 FINDINGS: No acute fracture or dislocation identified. Internal rod/screw fixation within the RIGHT femur is noted traversing a remote fracture. LEFT hip arthroplasty changes are identified. No focal bony lesions are present. Vascular calcifications are again noted. IMPRESSION: 1. No evidence of acute abnormality. 2. Prior ORIF of the RIGHT femur. LEFT hip arthroplasty. Electronically Signed   By: Margarette Canada M.D.   On: 06/27/2020 18:15     ASSESSMENT:  1. 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 -Durvalumab consolidation from 09/19/2017 through 09/17/2018. -Denies any cough or hemoptysis. -I reviewed CT chest with contrast from 06/16/2019 which showed 9 mm nodule in the medial right upper lobe unchanged. Adjacent radiation changes. No evidence of progression or metastatic disease. -CT chest on 12/16/2019 showed stable radiation changes and small nodule in the  medial right upper lobe with no signs of recurrence.  New bandlike area of septal thickening and micro nodularity in the right lateral and left anterior upper chest, most suggestive of sequela of infection/inflammation.  Signs of hepatic cirrhosis.   PLAN:  1. Stage II (T1BN2) right upper lobe adenocarcinoma: - He is currently residing at [facility] assisted living facility.  He is also receiving physical therapy. - I have reviewed CT chest with contrast from 06/22/2020 which showed unchanged subpleural nodule of the posterior right pulmonary apex measuring 0.9 cm.  Minimal radiation fibrosis.  No evidence of recurrence. - He is continuing to be in remission at this time.  We have also reviewed his blood work from the same day which was grossly within normal limits. - Recommend follow-up in 6 months with repeat CT scan of the chest.  2. Neuropathy: -On and off numbness in extremities without neuropathic pains.  3. Hypothyroidism: -Continue Synthroid daily.   Orders placed this encounter:  No orders of the defined types were placed in this encounter.    Derek Jack, MD [employer] [phone]   I, Milinda Antis, am acting as a scribe for Dr. Sanda Linger.  I, Derek Jack MD, have reviewed the above documentation for accuracy and completeness, and I agree with the above.

## 2020-06-30 DIAGNOSIS — R410 Disorientation, unspecified: Secondary | ICD-10-CM | POA: Diagnosis not present

## 2020-06-30 DIAGNOSIS — Z681 Body mass index (BMI) 19 or less, adult: Secondary | ICD-10-CM | POA: Diagnosis not present

## 2020-06-30 DIAGNOSIS — J22 Unspecified acute lower respiratory infection: Secondary | ICD-10-CM | POA: Diagnosis not present

## 2020-06-30 DIAGNOSIS — F4320 Adjustment disorder, unspecified: Secondary | ICD-10-CM | POA: Diagnosis not present

## 2020-07-06 DIAGNOSIS — U071 COVID-19: Secondary | ICD-10-CM | POA: Diagnosis not present

## 2020-07-06 DIAGNOSIS — J449 Chronic obstructive pulmonary disease, unspecified: Secondary | ICD-10-CM | POA: Diagnosis not present

## 2020-07-06 DIAGNOSIS — Z20828 Contact with and (suspected) exposure to other viral communicable diseases: Secondary | ICD-10-CM | POA: Diagnosis not present

## 2020-07-11 ENCOUNTER — Emergency Department (HOSPITAL_COMMUNITY): Payer: PPO

## 2020-07-11 ENCOUNTER — Encounter (HOSPITAL_COMMUNITY): Payer: Self-pay

## 2020-07-11 ENCOUNTER — Emergency Department (HOSPITAL_COMMUNITY)
Admission: EM | Admit: 2020-07-11 | Discharge: 2020-07-11 | Disposition: A | Discharge status: Home / Self Care | Payer: PPO | Attending: Emergency Medicine | Admitting: Emergency Medicine

## 2020-07-11 ENCOUNTER — Other Ambulatory Visit: Payer: Self-pay

## 2020-07-11 DIAGNOSIS — S0003XA Contusion of scalp, initial encounter: Secondary | ICD-10-CM | POA: Diagnosis not present

## 2020-07-11 DIAGNOSIS — Z7951 Long term (current) use of inhaled steroids: Secondary | ICD-10-CM | POA: Diagnosis not present

## 2020-07-11 DIAGNOSIS — I10 Essential (primary) hypertension: Secondary | ICD-10-CM | POA: Diagnosis not present

## 2020-07-11 DIAGNOSIS — E039 Hypothyroidism, unspecified: Secondary | ICD-10-CM | POA: Diagnosis not present

## 2020-07-11 DIAGNOSIS — S2231XA Fracture of one rib, right side, initial encounter for closed fracture: Secondary | ICD-10-CM | POA: Diagnosis not present

## 2020-07-11 DIAGNOSIS — M47812 Spondylosis without myelopathy or radiculopathy, cervical region: Secondary | ICD-10-CM | POA: Diagnosis not present

## 2020-07-11 DIAGNOSIS — R0902 Hypoxemia: Secondary | ICD-10-CM | POA: Diagnosis not present

## 2020-07-11 DIAGNOSIS — S0990XA Unspecified injury of head, initial encounter: Secondary | ICD-10-CM | POA: Diagnosis not present

## 2020-07-11 DIAGNOSIS — Z79899 Other long term (current) drug therapy: Secondary | ICD-10-CM | POA: Diagnosis not present

## 2020-07-11 DIAGNOSIS — W01198A Fall on same level from slipping, tripping and stumbling with subsequent striking against other object, initial encounter: Secondary | ICD-10-CM | POA: Insufficient documentation

## 2020-07-11 DIAGNOSIS — I6523 Occlusion and stenosis of bilateral carotid arteries: Secondary | ICD-10-CM | POA: Diagnosis not present

## 2020-07-11 DIAGNOSIS — R410 Disorientation, unspecified: Secondary | ICD-10-CM | POA: Diagnosis not present

## 2020-07-11 DIAGNOSIS — Z85118 Personal history of other malignant neoplasm of bronchus and lung: Secondary | ICD-10-CM | POA: Insufficient documentation

## 2020-07-11 DIAGNOSIS — F0390 Unspecified dementia without behavioral disturbance: Secondary | ICD-10-CM | POA: Diagnosis not present

## 2020-07-11 DIAGNOSIS — R0602 Shortness of breath: Secondary | ICD-10-CM | POA: Diagnosis not present

## 2020-07-11 DIAGNOSIS — J441 Chronic obstructive pulmonary disease with (acute) exacerbation: Secondary | ICD-10-CM | POA: Diagnosis not present

## 2020-07-11 DIAGNOSIS — S199XXA Unspecified injury of neck, initial encounter: Secondary | ICD-10-CM | POA: Diagnosis not present

## 2020-07-11 DIAGNOSIS — Z87891 Personal history of nicotine dependence: Secondary | ICD-10-CM | POA: Diagnosis not present

## 2020-07-11 DIAGNOSIS — R296 Repeated falls: Secondary | ICD-10-CM | POA: Diagnosis not present

## 2020-07-11 DIAGNOSIS — W19XXXA Unspecified fall, initial encounter: Secondary | ICD-10-CM

## 2020-07-11 DIAGNOSIS — I672 Cerebral atherosclerosis: Secondary | ICD-10-CM | POA: Diagnosis not present

## 2020-07-11 DIAGNOSIS — Y92129 Unspecified place in nursing home as the place of occurrence of the external cause: Secondary | ICD-10-CM | POA: Insufficient documentation

## 2020-07-11 DIAGNOSIS — Z96642 Presence of left artificial hip joint: Secondary | ICD-10-CM | POA: Diagnosis not present

## 2020-07-11 DIAGNOSIS — S0191XA Laceration without foreign body of unspecified part of head, initial encounter: Secondary | ICD-10-CM | POA: Diagnosis not present

## 2020-07-11 DIAGNOSIS — M4802 Spinal stenosis, cervical region: Secondary | ICD-10-CM | POA: Diagnosis not present

## 2020-07-11 DIAGNOSIS — J9811 Atelectasis: Secondary | ICD-10-CM | POA: Diagnosis not present

## 2020-07-11 NOTE — ED Triage Notes (Signed)
Pt brought to ED via [facility] for fall at 0330 this am, was able to get himself up. Found he has a hematoma to right side of his head, and lacerations to right hand. Pt states he lost his balance and has been falling a lot lately. Pt denies pain. Pt alert and oriented to person, DOB, and place.

## 2020-07-11 NOTE — ED Notes (Signed)
Pt's wound cleaned and non adhesive dressing applied

## 2020-07-11 NOTE — ED Notes (Signed)
Pt unable to sign MSE due to AMS.  

## 2020-07-11 NOTE — Discharge Instructions (Addendum)
Follow-up with your doctor for recheck next week

## 2020-07-11 NOTE — ED Notes (Signed)
[facility] called to set up transport back to [facility] at this time.

## 2020-07-11 NOTE — ED Notes (Signed)
Patient transported to CT 

## 2020-07-11 NOTE — ED Provider Notes (Signed)
[facility] EMERGENCY DEPARTMENT Provider Note   CSN: [id number] Arrival date & time: 07/11/20  8315     History Chief Complaint  Patient presents with  . Fall    Ian Aguilar is a 80 y.o. male.  Patient fell at the nursing home and hit his head no loss of consciousness.  Patient has dementia.  According to son he is back to his normal self  The history is provided by the patient and medical records. No language interpreter was used.  Fall This is a new problem. The current episode started 3 to 5 hours ago. The problem occurs rarely. The problem has not changed since onset.Pertinent negatives include no chest pain. Nothing aggravates the symptoms. Nothing relieves the symptoms. He has tried nothing for the symptoms.       Past Medical History:  Diagnosis Date  . Anxiety   . Arthritis   . Cancer ([place])   . Cirrhosis ([place])    confirmed by MRI on 07/04/11.    Marland Kitchen COPD (chronic obstructive pulmonary disease) ([place])   . Depression with anxiety   . ETOH abuse   . Hyperlipidemia   . Hypertension    diet control  . Nodule of upper lobe of right lung    with mediastinal adenopathy  . Wears dentures   . Wears glasses     Patient Active Problem List   Diagnosis Date Noted  . Neurocognitive deficits 04/29/2020  . Hyperlipidemia LDL goal <100 04/27/2020  . Metacarpal bone fracture 04/27/2020  . Aortic atherosclerosis ([place]) 04/27/2020  . Chronic constipation 04/27/2020  . Severe hypothyroidism 04/22/2020  . Distention of biliary tract 04/21/2020  . AKI (acute kidney injury) ([place]) 04/21/2020  . Hypothyroidism 04/21/2020  . Abdominal distension 04/20/2020  . Acute encephalopathy 10/23/2019  . Dehydration 10/23/2019  . COPD with acute exacerbation ([place]) 10/23/2019  . Chronic hypoxemic respiratory failure ([place]) 10/23/2019  . Hypertensive urgency 10/23/2019  . Goals of care, counseling/discussion   . Palliative care by specialist   . DNR (do not resuscitate) discussion   .  Malignant neoplasm of bronchus and lung ([place])   . Carcinoma, lung ([place]) 06/05/2017  . History of colonic polyps 04/06/2017  . History of snoring 09/07/2015  . Fracture of ulnar styloid   . Benign essential HTN   . Faintness   . Thrombocytopenia ([place]) 09/03/2015  . Hyponatremia 09/03/2015  . Closed displaced intertrochanteric fracture of right femur ([place])   . Distal radius fracture, right 09/02/2015  . Fracture of right hip requiring operative repair ([place]) 09/01/2015  . Alcohol abuse 09/01/2015  . Tobacco abuse 09/01/2015  . COPD (chronic obstructive pulmonary disease) 09/01/2015  . 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 Weakness of left leg 01/10/2012  . Difficulty in walking(719.7) 01/10/2012  . Pain in left hip 01/10/2012  . Acute blood loss anemia 12/21/2011  . Cirrhosis- imaging diagnosis 2013 09/08/2011  . Umbilical hernia 17/61/6073  . 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  . Hepatomegaly 06/14/2011  . Encounter for screening colonoscopy 06/14/2011  . JOINT EFFUSION, KNEE 02/10/2008  . KNEE PAIN 02/10/2008    Past Surgical History:  Procedure Laterality Date  . bilateral cataract surgery     [place]  . broken arm  6 yrs ago   left otif of wrist-[place]  . CLOSED REDUCTION WRIST FRACTURE Right 09/02/2015   Procedure: RIGHT WRIST REDUCTION;  Surgeon: Renette Butters, MD;  Location: [place];  Service: Orthopedics;  Laterality: Right;  with MAC  . COLONOSCOPY  1996   [place]: external hemorrhoids, no polyps  . COLONOSCOPY  06/28/2011   Dr. Ala Bent diverticulosis,tubular adenoma, hyperplastic polyp  . COLONOSCOPY WITH PROPOFOL N/A 04/26/2017   Dr. Gala Romney: perianal and digital rectal examinations normal, diffusely congested colonic mucosa but otherwise normal  . ESOPHAGOGASTRODUODENOSCOPY  06/28/2011   Dr. Chelsea Aus erosive reflux esophagitis, hiatal hernia-gastritis  . ESOPHAGOGASTRODUODENOSCOPY (EGD) WITH PROPOFOL N/A 04/26/2017   Dr. Gala Romney: normal esophagus, small hiatal  hernia, GAVE, portal hypertensive gastropathy, normal duodenal bulb and second portion, no specimens collected. 2 years screening   . ESOPHAGOGASTRODUODENOSCOPY (EGD) WITH PROPOFOL N/A 06/19/2019   normal esophagus, portal gastropathy, GAVE, normal duodenum. 2 years screening.   Marland Kitchen EXTERNAL EAR SURGERY     right ear-cleaned out ear and created new eardrum  . INGUINAL HERNIA REPAIR  2-3 yrs ago   left-[place]_  . INTRAMEDULLARY (IM) NAIL INTERTROCHANTERIC Right 09/02/2015   Procedure: RIGHT  INTERTROCHANTRIC HIP;  Surgeon: Renette Butters, MD;  Location: [place];  Service: Orthopedics;  Laterality: Right;  With MAC  . KNEE SURGERY     left-arthroscopy-[place]  . PORTACATH PLACEMENT Left 06/08/2017   Procedure: INSERTION POWER PORT WITH  ATTACHED CATHETER LEFT SUBCLAVIAN;  Surgeon: Aviva Signs, MD;  Location: [facility];  Service: General;  Laterality: Left;  . SKULL FRACTURE ELEVATION     fractured cheeck bone repaired  . TOTAL HIP ARTHROPLASTY  12/18/2011   Procedure: TOTAL HIP ARTHROPLASTY;  Surgeon: Carole Civil, MD;  Location: [facility];  Service: Orthopedics;  Laterality: Left;  Marland Kitchen VIDEO BRONCHOSCOPY WITH ENDOBRONCHIAL ULTRASOUND N/A 05/04/2017   Procedure: VIDEO BRONCHOSCOPY WITH ENDOBRONCHIAL ULTRASOUND;  Surgeon: Melrose Nakayama, MD;  Location[facility];  Service: Thoracic;  Laterality: N/A;  . WISDOM TOOTH EXTRACTION         Family History  Problem Relation Age of Onset  . Stroke Mother 15  . Heart disease Father 6       MI  . Diabetes Maternal Uncle   . Colon cancer Neg Hx   . Cancer Neg Hx     Social History   Tobacco Use  . Smoking status: Former Smoker    Packs/day: 0.00    Years: 62.00    Pack years: 0.00    Types: Cigarettes    Quit date: 02/28/2019    Years since quitting: 1.3  . Smokeless tobacco: Never Used  . Tobacco comment: 1/2 ppd > 60 years as of 06/27/17: 4-5 cigarettes or less a day  Vaping Use  . Vaping Use: Never used  Substance Use Topics  .  Alcohol use: Not Currently    Comment: stopped 4 years ago  . Drug use: No    Home Medications Prior to Admission medications   Medication Sig Start Date End Date Taking? Authorizing Provider  albuterol (VENTOLIN HFA) 108 (90 Base) MCG/ACT inhaler Inhale 2 puffs into the lungs every 8 (eight) hours as needed. 05/21/20   Gerlene Fee, NP  cholecalciferol (VITAMIN D) 1000 units tablet Take 1,000 Units by mouth daily.    [provider]  docusate sodium (COLACE) 100 MG capsule Take 1 capsule (100 mg total) by mouth 2 (two) times daily. 04/23/20 04/23/21  Lavina Hamman, MD  feeding supplement (ENSURE ENLIVE / ENSURE PLUS) LIQD Take 237 mLs by mouth 2 (two) times daily between meals. Patient not taking: No sig reported 04/27/20   [provider]  GOODSENSE ARTIFICIAL TEARS 0.5-0.6 % SOLN Apply to eye. 05/24/20   [provider]  guaiFENesin (MUCINEX) 600 MG 12 hr tablet Take 600 mg by mouth 2 (two) times daily as needed for cough (congestion).    [provider]  levothyroxine (SYNTHROID) 125 MCG tablet Take 1 tablet (125 mcg total) by mouth daily at 6 (six) AM. 05/21/20   Nyoka Cowden, Phylis Bougie, NP  NON FORMULARY Diet NAS 04/23/20   [provider]  OXYGEN Inhale 2 L into the lungs continuous. 04/24/20   [provider]  polyethylene glycol (MIRALAX / GLYCOLAX) 17 g packet Take 17 g by mouth daily. 04/23/20   Lavina Hamman, MD  rosuvastatin (CRESTOR) 10 MG tablet Take 1 tablet (10 mg total) by mouth daily. 05/21/20   Gerlene Fee, NP  sertraline (ZOLOFT) 50 MG tablet Take 1 tablet by mouth daily. 06/07/20   [provider]  SPIRIVA RESPIMAT 2.5 MCG/ACT AERS Inhale 2 puffs into the lungs daily. 05/31/20   [provider]  triamcinolone (KENALOG) 0.025 % ointment Apply 1 application topically 2 (two) times daily. Apply to face and arms sparingly for dryness 05/24/20   [provider]    Allergies    Patient has no known  allergies.  Review of Systems   Review of Systems  Unable to perform ROS: Dementia  Cardiovascular: Negative for chest pain.    Physical Exam Updated Vital Signs BP (!) 140/108   Pulse 79   Temp 97.9 F (36.6 C) (Oral)   Resp (!) 26   Ht 5' 11.5" (1.816 m)   Wt 78.9 kg   SpO2 (!) 84%   BMI 23.93 kg/m   Physical Exam Vitals and nursing note reviewed.  Constitutional:      Appearance: He is well-developed.  HENT:     Head: Normocephalic.     Comments: Small nonsuturable laceration and abrasion to right forehead    Mouth/Throat:     Mouth: Mucous membranes are moist.  Eyes:     General: No scleral icterus.    Conjunctiva/sclera: Conjunctivae normal.  Neck:     Thyroid: No thyromegaly.  Cardiovascular:     Rate and Rhythm: Normal rate and regular rhythm.     Heart sounds: No murmur heard. No friction rub. No gallop.   Pulmonary:     Breath sounds: No stridor. No wheezing or rales.  Chest:     Chest wall: No tenderness.  Abdominal:     General: There is no distension.     Tenderness: There is no abdominal tenderness. There is no rebound.  Musculoskeletal:        General: Normal range of motion.     Cervical back: Neck supple.  Lymphadenopathy:     Cervical: No cervical adenopathy.  Skin:    Findings: No erythema or rash.  Neurological:     Mental Status: He is alert.     Motor: No abnormal muscle tone.     Coordination: Coordination normal.     Comments: Patient oriented to person only but alert  Psychiatric:        Behavior: Behavior normal.     ED Results / Procedures / Treatments   Labs (all labs ordered are listed, but only abnormal results are displayed) Labs Reviewed - No data to display  EKG None  Radiology CT Head Wo Contrast  Result Date: 07/11/2020 CLINICAL DATA:  80 year old who lost his balance and fell at approximately 3:30 a.m. this morning. Hematoma to the RIGHT frontal region. Initial encounter. Multiple recent falls. EXAM: CT HEAD  WITHOUT CONTRAST CT  CERVICAL SPINE WITHOUT CONTRAST TECHNIQUE: Multidetector CT imaging of the head and cervical spine was performed following the standard protocol without intravenous contrast. Multiplanar CT image reconstructions of the cervical spine were also generated. COMPARISON:  MRI brain 04/29/2018. MRI cervical spine 01/02/2020. No prior CTs. FINDINGS: CT HEAD FINDINGS Brain: Moderate age related cortical and deep atrophy. Moderate to severe changes of small vessel disease of the white matter. No mass lesion. No midline shift. No acute hemorrhage or hematoma. No extra-axial fluid collections. No evidence of acute infarction. Vascular: Mild BILATERAL carotid siphon and LEFT vertebral artery atherosclerosis. No hyperdense vessel. Skull: No skull fracture or other focal osseous abnormality involving the skull. Sinuses/Orbits: Visualized paranasal sinuses, BILATERAL middle ear cavities and LEFT mastoid air cells well aerated. Small amount of fluid in the inferior RIGHT mastoid air cells. Visualized orbits and globes normal. Other: RIGHT frontal scalp hematoma. CT CERVICAL SPINE FINDINGS Alignment: Anatomic posterior alignment. Reversal of the usual cervical lordosis. Facet joints anatomically aligned throughout. Skull base and vertebrae: No fractures identified involving the cervical spine. Coronal reformatted images demonstrate an intact craniocervical junction, intact dens and intact lateral masses throughout. Soft tissues and spinal canal: No evidence of paraspinous or spinal canal hematoma. No evidence of spinal stenosis. Disc levels: Disc space narrowing and endplate hypertrophic changes at multiple levels including C4-5, C5-6, C6-7 and C7-T1, greatest at C7-T1. Facet and uncinate hypertrophy account for multilevel foraminal stenoses including mild RIGHT C3-4, severe BILATERAL C4-5, severe RIGHT and moderate LEFT C5-6, severe BILATERAL C6-7 and severe BILATERAL C7-T1. Upper chest: Visualized lung apices  clear. Atherosclerosis involving the proximal great vessels. Other: Severe cervical carotid atherosclerosis, including a possible hemodynamically significant stenosis involving the RIGHT common carotid artery at or near its bifurcation. IMPRESSION: 1. No acute intracranial abnormality. 2. No cervical spine fractures identified. 3. Moderate age related generalized atrophy and moderate to severe chronic microvascular ischemic changes of the white matter. 4. Multilevel degenerative disc disease, spondylosis and facet degenerative changes throughout the cervical spine resulting in multilevel foraminal stenoses as detailed above. 5. Possible hemodynamically significant stenosis involving the RIGHT common carotid artery at or near its bifurcation. Electronically Signed   By: Evangeline Dakin M.D.   On: 07/11/2020 09:18   CT Cervical Spine Wo Contrast  Result Date: 07/11/2020 CLINICAL DATA:  80 year old who lost his balance and fell at approximately 3:30 a.m. this morning. Hematoma to the RIGHT frontal region. Initial encounter. Multiple recent falls. EXAM: CT HEAD WITHOUT CONTRAST CT CERVICAL SPINE WITHOUT CONTRAST TECHNIQUE: Multidetector CT imaging of the head and cervical spine was performed following the standard protocol without intravenous contrast. Multiplanar CT image reconstructions of the cervical spine were also generated. COMPARISON:  MRI brain 04/29/2018. MRI cervical spine 01/02/2020. No prior CTs. FINDINGS: CT HEAD FINDINGS Brain: Moderate age related cortical and deep atrophy. Moderate to severe changes of small vessel disease of the white matter. No mass lesion. No midline shift. No acute hemorrhage or hematoma. No extra-axial fluid collections. No evidence of acute infarction. Vascular: Mild BILATERAL carotid siphon and LEFT vertebral artery atherosclerosis. No hyperdense vessel. Skull: No skull fracture or other focal osseous abnormality involving the skull. Sinuses/Orbits: Visualized paranasal  sinuses, BILATERAL middle ear cavities and LEFT mastoid air cells well aerated. Small amount of fluid in the inferior RIGHT mastoid air cells. Visualized orbits and globes normal. Other: RIGHT frontal scalp hematoma. CT CERVICAL SPINE FINDINGS Alignment: Anatomic posterior alignment. Reversal of the usual cervical lordosis. Facet joints anatomically aligned throughout. Skull base and vertebrae:  No fractures identified involving the cervical spine. Coronal reformatted images demonstrate an intact craniocervical junction, intact dens and intact lateral masses throughout. Soft tissues and spinal canal: No evidence of paraspinous or spinal canal hematoma. No evidence of spinal stenosis. Disc levels: Disc space narrowing and endplate hypertrophic changes at multiple levels including C4-5, C5-6, C6-7 and C7-T1, greatest at C7-T1. Facet and uncinate hypertrophy account for multilevel foraminal stenoses including mild RIGHT C3-4, severe BILATERAL C4-5, severe RIGHT and moderate LEFT C5-6, severe BILATERAL C6-7 and severe BILATERAL C7-T1. Upper chest: Visualized lung apices clear. Atherosclerosis involving the proximal great vessels. Other: Severe cervical carotid atherosclerosis, including a possible hemodynamically significant stenosis involving the RIGHT common carotid artery at or near its bifurcation. IMPRESSION: 1. No acute intracranial abnormality. 2. No cervical spine fractures identified. 3. Moderate age related generalized atrophy and moderate to severe chronic microvascular ischemic changes of the white matter. 4. Multilevel degenerative disc disease, spondylosis and facet degenerative changes throughout the cervical spine resulting in multilevel foraminal stenoses as detailed above. 5. Possible hemodynamically significant stenosis involving the RIGHT common carotid artery at or near its bifurcation. Electronically Signed   By: Evangeline Dakin M.D.   On: 07/11/2020 09:18   DG Chest Port 1 View  Result Date:  07/11/2020 CLINICAL DATA:  Shortness of breath.  Fell at 3:30 this morning. EXAM: PORTABLE CHEST 1 VIEW COMPARISON:  06/29/2020 FINDINGS: Patient has a LEFT-sided power port, tip overlying superior vena cava. Heart size is normal. There is subsegmental atelectasis at the RIGHT lung base. Remote RIGHT rib fractures are present. A possible acute fracture is identified involving the LATERAL aspect of the LEFT 7th rib. There is no pneumothorax. IMPRESSION: Possible acute fracture of the LEFT LATERAL 7th rib. No pneumothorax. Electronically Signed   By: Nolon Nations M.D.   On: 07/11/2020 08:52    Procedures Procedures   Medications Ordered in ED Medications - No data to display  ED Course  I have reviewed the triage vital signs and the nursing notes.  Pertinent labs & imaging results that were available during my care of the patient were reviewed by me and considered in my medical decision making (see chart for details).    MDM Rules/Calculators/A&P                         X-rays negative.  Patient with fall contusion to forehead.  He will follow-up with his PCP  Final Clinical Impression(s) / ED Diagnoses Final diagnoses:  Fall, initial encounter  Injury of head, initial encounter    Rx / DC Orders ED Discharge Orders    None       Milton Ferguson, MD 07/11/20 1048

## 2020-07-12 DIAGNOSIS — R350 Frequency of micturition: Secondary | ICD-10-CM | POA: Diagnosis not present

## 2020-07-12 DIAGNOSIS — R339 Retention of urine, unspecified: Secondary | ICD-10-CM | POA: Diagnosis not present

## 2020-07-13 DIAGNOSIS — U071 COVID-19: Secondary | ICD-10-CM | POA: Diagnosis not present

## 2020-07-13 DIAGNOSIS — Z20828 Contact with and (suspected) exposure to other viral communicable diseases: Secondary | ICD-10-CM | POA: Diagnosis not present

## 2020-07-15 ENCOUNTER — Other Ambulatory Visit: Payer: Self-pay

## 2020-07-15 ENCOUNTER — Emergency Department (HOSPITAL_COMMUNITY): Payer: PPO

## 2020-07-15 ENCOUNTER — Emergency Department (HOSPITAL_COMMUNITY)
Admission: EM | Admit: 2020-07-15 | Discharge: 2020-07-15 | Disposition: A | Discharge status: Home / Self Care | Payer: PPO | Attending: Emergency Medicine | Admitting: Emergency Medicine

## 2020-07-15 DIAGNOSIS — E039 Hypothyroidism, unspecified: Secondary | ICD-10-CM | POA: Diagnosis not present

## 2020-07-15 DIAGNOSIS — Z7951 Long term (current) use of inhaled steroids: Secondary | ICD-10-CM | POA: Insufficient documentation

## 2020-07-15 DIAGNOSIS — Z96642 Presence of left artificial hip joint: Secondary | ICD-10-CM | POA: Insufficient documentation

## 2020-07-15 DIAGNOSIS — Z79899 Other long term (current) drug therapy: Secondary | ICD-10-CM | POA: Insufficient documentation

## 2020-07-15 DIAGNOSIS — Z85118 Personal history of other malignant neoplasm of bronchus and lung: Secondary | ICD-10-CM | POA: Insufficient documentation

## 2020-07-15 DIAGNOSIS — R059 Cough, unspecified: Secondary | ICD-10-CM | POA: Diagnosis not present

## 2020-07-15 DIAGNOSIS — Z20822 Contact with and (suspected) exposure to covid-19: Secondary | ICD-10-CM | POA: Insufficient documentation

## 2020-07-15 DIAGNOSIS — I10 Essential (primary) hypertension: Secondary | ICD-10-CM | POA: Insufficient documentation

## 2020-07-15 DIAGNOSIS — Z87891 Personal history of nicotine dependence: Secondary | ICD-10-CM | POA: Insufficient documentation

## 2020-07-15 DIAGNOSIS — J441 Chronic obstructive pulmonary disease with (acute) exacerbation: Secondary | ICD-10-CM

## 2020-07-15 DIAGNOSIS — I700 Atherosclerosis of aorta: Secondary | ICD-10-CM | POA: Diagnosis not present

## 2020-07-15 LAB — CBC WITH DIFFERENTIAL/PLATELET
Abs Immature Granulocytes: 0.02 10*3/uL (ref 0.00–0.07)
Basophils Absolute: 0 10*3/uL (ref 0.0–0.1)
Basophils Relative: 0 %
Eosinophils Absolute: 0.1 10*3/uL (ref 0.0–0.5)
Eosinophils Relative: 2 %
HCT: 45.6 % (ref 39.0–52.0)
Hemoglobin: 14.2 g/dL (ref 13.0–17.0)
Immature Granulocytes: 0 %
Lymphocytes Relative: 9 %
Lymphs Abs: 0.5 10*3/uL — ABNORMAL LOW (ref 0.7–4.0)
MCH: 31.2 pg (ref 26.0–34.0)
MCHC: 31.1 g/dL (ref 30.0–36.0)
MCV: 100.2 fL — ABNORMAL HIGH (ref 80.0–100.0)
Monocytes Absolute: 0.6 10*3/uL (ref 0.1–1.0)
Monocytes Relative: 12 %
Neutro Abs: 4 10*3/uL (ref 1.7–7.7)
Neutrophils Relative %: 77 %
Platelets: 185 10*3/uL (ref 150–400)
RBC: 4.55 MIL/uL (ref 4.22–5.81)
RDW: 13.7 % (ref 11.5–15.5)
WBC: 5.2 10*3/uL (ref 4.0–10.5)
nRBC: 0 % (ref 0.0–0.2)

## 2020-07-15 LAB — RESP PANEL BY RT-PCR (FLU A&B, COVID) ARPGX2
Influenza A by PCR: NEGATIVE
Influenza B by PCR: NEGATIVE
SARS Coronavirus 2 by RT PCR: NEGATIVE

## 2020-07-15 LAB — BASIC METABOLIC PANEL
Anion gap: 10 (ref 5–15)
BUN: 11 mg/dL (ref 8–23)
CO2: 36 mmol/L — ABNORMAL HIGH (ref 22–32)
Calcium: 9.1 mg/dL (ref 8.9–10.3)
Chloride: 90 mmol/L — ABNORMAL LOW (ref 98–111)
Creatinine, Ser: 0.77 mg/dL (ref 0.61–1.24)
GFR, Estimated: >60 mL/min (ref >60)
Glucose, Bld: 105 mg/dL — ABNORMAL HIGH (ref 70–99)
Potassium: 4.2 mmol/L (ref 3.5–5.1)
Sodium: 136 mmol/L (ref 135–145)

## 2020-07-15 MED ORDER — ALBUTEROL SULFATE HFA 108 (90 BASE) MCG/ACT IN AERS
4.0000 | INHALATION_SPRAY | RESPIRATORY_TRACT | Status: DC | PRN
  Administered 2020-07-15: 4 via RESPIRATORY_TRACT
  Filled 2020-07-15: qty 6.7

## 2020-07-15 MED ORDER — PREDNISONE 50 MG PO TABS
50.0000 mg | ORAL_TABLET | Freq: Once | ORAL | Status: AC
  Administered 2020-07-15: 50 mg via ORAL
  Filled 2020-07-15: qty 1

## 2020-07-15 MED ORDER — AEROCHAMBER PLUS FLO-VU MEDIUM MISC
1.0000 | Freq: Once | Status: AC
  Administered 2020-07-15: 1
  Filled 2020-07-15 (×2): qty 1

## 2020-07-15 MED ORDER — PREDNISONE 50 MG PO TABS: ORAL_TABLET | ORAL | 0 refills | Status: DC

## 2020-07-15 NOTE — Discharge Instructions (Addendum)
Continue using your spiriva as prescribed by your MD.  Add the albuterol inhaler taking 2 puffs every 4 hours if you are coughing, wheezing or short of breath.  You have been started on prednisone to help you with your wheezing and have been prescribed more for the next 5 days.  Take your next dose of this medicine tomorrow evening with your dinner.

## 2020-07-15 NOTE — ED Provider Notes (Signed)
[facility] EMERGENCY DEPARTMENT Provider Note   CSN: [id number] Arrival date & time: 07/15/20  1333     History Chief Complaint  Patient presents with  . Cough    Ian Aguilar is a 80 y.o. male with a history as outlined below including cirrhosis secondary to history of EtOH abuse, hypertension, COPD, history of lung cancer under the care of Dr. Delton Coombes, currently completed radiation and chemotherapy in receiving 85-monthsurveillance visits presenting for evaluation of a cough which has been present for several weeks but reportedly getting worse.  He has a wet sounding cough but denies ability to cough up any sputum.  He denies chest pain, shortness of breath, also denies fevers or chills, no abdominal pain, nausea or vomiting.  He has several medications on his medication list including albuterol and Spiriva, but states he does not recall the last time he used these medications, of note, he [facility] from a loc[facility] home and his MAR suggests he has been getting these medications.  He is completely immunized for COVID-19.   HPI     Past Medical History:  Diagnosis Date  . Anxiety   . Arthritis   . Cancer ([place]   . Cirrhosis ([place]    confirmed by MRI on 07/04/11.    .Marland KitchenCOPD (chronic obstructive pulmonary disease) (HCoos   . Depression with anxiety   . ETOH abuse   . Hyperlipidemia   . Hypertension    diet control  . Nodule of upper lobe of right lung    with mediastinal adenopathy  . Wears dentures   . Wears glasses     Patient Active Problem List   Diagnosis Date Noted  . Neurocognitive deficits 04/29/2020  . Hyperlipidemia LDL goal <100 04/27/2020  . Metacarpal bone fracture 04/27/2020  . Aortic atherosclerosis ([place] 04/27/2020  . Chronic constipation 04/27/2020  . Severe hypothyroidism 04/22/2020  . Distention of biliary tract 04/21/2020  . AKI (acute kidney injury) ([place] 04/21/2020  . Hypothyroidism 04/21/2020  . Abdominal distension 04/20/2020  . Acute  encephalopathy 10/23/2019  . Dehydration 10/23/2019  . COPD with acute exacerbation (HP[place] 10/23/2019  . Chronic hypoxemic respiratory failure ([place] 10/23/2019  . Hypertensive urgency 10/23/2019  . Goals of care, counseling/discussion   . Palliative care by specialist   . DNR (do not resuscitate) discussion   . Malignant neoplasm of bronchus and lung (HCastle Hayne   . Carcinoma, lung ([place] 06/05/2017  . History of colonic polyps 04/06/2017  . History of snoring 09/07/2015  . Fracture of ulnar styloid   . Benign essential HTN   . Faintness   . Thrombocytopenia ([place] 09/03/2015  . Hyponatremia 09/03/2015  . Closed displaced intertrochanteric fracture of right femur (HG[place]   . Distal radius fracture, right 09/02/2015  . Fracture of right hip requiring operative repair (H[place] 09/01/2015  . Alcohol abuse 09/01/2015  . Tobacco abuse 09/01/2015  . COPD (chronic obstructive pulmonary disease) 09/01/2015  . 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 Weakness of left leg 01/10/2012  . Difficulty in walking(719.7) 01/10/2012  . Pain in left hip 01/10/2012  . Acute blood loss anemia 12/21/2011  . Cirrhosis- imaging diagnosis 2013 09/08/2011  . Umbilical hernia 036/64/4034 . 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  . Hepatomegaly 06/14/2011  . Encounter for screening colonoscopy 06/14/2011  . JOINT EFFUSION, KNEE 02/10/2008  . KNEE PAIN 02/10/2008    Past Surgical History:  Procedure Laterality Date  . bilateral cataract surgery     [place]  . broken  arm  6 yrs ago   left otif of wrist-[place]  . CLOSED REDUCTION WRIST FRACTURE Right 09/02/2015   Procedure: RIGHT WRIST REDUCTION;  Surgeon: Renette Butters, MD;  Location: [facility];  Service: Orthopedics;  Laterality: Right;  with MAC  . COLONOSCOPY  1996   [place]: external hemorrhoids, no polyps  . COLONOSCOPY  06/28/2011   Dr. Ala Bent diverticulosis,tubular adenoma, hyperplastic polyp  . COLONOSCOPY WITH PROPOFOL N/A 04/26/2017   Dr. Gala Romney:  perianal and digital rectal examinations normal, diffusely congested colonic mucosa but otherwise normal  . ESOPHAGOGASTRODUODENOSCOPY  06/28/2011   Dr. Chelsea Aus erosive reflux esophagitis, hiatal hernia-gastritis  . ESOPHAGOGASTRODUODENOSCOPY (EGD) WITH PROPOFOL N/A 04/26/2017   Dr. Gala Romney: normal esophagus, small hiatal hernia, GAVE, portal hypertensive gastropathy, normal duodenal bulb and second portion, no specimens collected. 2 years screening   . ESOPHAGOGASTRODUODENOSCOPY (EGD) WITH PROPOFOL N/A 06/19/2019   normal esophagus, portal gastropathy, GAVE, normal duodenum. 2 years screening.   Marland Kitchen EXTERNAL EAR SURGERY     right ear-cleaned out ear and created new eardrum  . INGUINAL HERNIA REPAIR  2-3 yrs ago   left-[place]_  . INTRAMEDULLARY (IM) NAIL INTERTROCHANTERIC Right 09/02/2015   Procedure: RIGHT  INTERTROCHANTRIC HIP;  Surgeon: Renette Butters, MD;  Location: [facility];  Service: Orthopedics;  Laterality: Right;  With MAC  . KNEE SURGERY     left-arthroscopy-[place]  . PORTACATH PLACEMENT Left 06/08/2017   Procedure: INSERTION POWER PORT WITH  ATTACHED CATHETER LEFT SUBCLAVIAN;  Surgeon: Aviva Signs, MD;  Location: [facility];  Service: General;  Laterality: Left;  . SKULL FRACTURE ELEVATION     fractured cheeck bone repaired  . TOTAL HIP ARTHROPLASTY  12/18/2011   Procedure: TOTAL HIP ARTHROPLASTY;  Surgeon: Carole Civil, MD;  Location: [facility];  Service: Orthopedics;  Laterality: Left;  Marland Kitchen VIDEO BRONCHOSCOPY WITH ENDOBRONCHIAL ULTRASOUND N/A 05/04/2017   Procedure: VIDEO BRONCHOSCOPY WITH ENDOBRONCHIAL ULTRASOUND;  Surgeon: Melrose Nakayama, MD;  [facility];  Service: Thoracic;  Laterality: N/A;  . WISDOM TOOTH EXTRACTION         Family History  Problem Relation Age of Onset  . Stroke Mother 65  . Heart disease Father 51       MI  . Diabetes Maternal Uncle   . Colon cancer Neg Hx   . Cancer Neg Hx     Social History   Tobacco Use  . Smoking status: Former  Smoker    Packs/day: 0.00    Years: 62.00    Pack years: 0.00    Types: Cigarettes    Quit date: 02/28/2019    Years since quitting: 1.3  . Smokeless tobacco: Never Used  . Tobacco comment: 1/2 ppd > 60 years as of 06/27/17: 4-5 cigarettes or less a day  Vaping Use  . Vaping Use: Never used  Substance Use Topics  . Alcohol use: Not Currently    Comment: stopped 4 years ago  . Drug use: No    Home Medications Prior to Admission medications   Medication Sig Start Date End Date Taking? Authorizing Provider  predniSONE (DELTASONE) 50 MG tablet Take one tablet daily for 5 days 07/15/20  Yes Twanna Resh, Almyra Free, PA-C  albuterol (VENTOLIN HFA) 108 (90 Base) MCG/ACT inhaler Inhale 2 puffs into the lungs every 8 (eight) hours as needed. 05/21/20   Gerlene Fee, NP  cholecalciferol (VITAMIN D) 1000 units tablet Take 1,000 Units by mouth daily.    [provider]  docusate sodium (COLACE) 100 MG capsule  Take 1 capsule (100 mg total) by mouth 2 (two) times daily. 04/23/20 04/23/21  Lavina Hamman, MD  feeding supplement (ENSURE ENLIVE / ENSURE PLUS) LIQD Take 237 mLs by mouth 2 (two) times daily between meals. Patient not taking: No sig reported 04/27/20   [provider]  GOODSENSE ARTIFICIAL TEARS 0.5-0.6 % SOLN Apply to eye. 05/24/20   [provider]  guaiFENesin (MUCINEX) 600 MG 12 hr tablet Take 600 mg by mouth 2 (two) times daily as needed for cough (congestion).    [provider]  levothyroxine (SYNTHROID) 125 MCG tablet Take 1 tablet (125 mcg total) by mouth daily at 6 (six) AM. 05/21/20   Nyoka Cowden, Phylis Bougie, NP  NON FORMULARY Diet NAS 04/23/20   [provider]  OXYGEN Inhale 2 L into the lungs continuous. 04/24/20   [provider]  polyethylene glycol (MIRALAX / GLYCOLAX) 17 g packet Take 17 g by mouth daily. 04/23/20   Lavina Hamman, MD  rosuvastatin (CRESTOR) 10 MG tablet Take 1 tablet (10 mg total) by mouth daily. 05/21/20   Gerlene Fee, NP   sertraline (ZOLOFT) 50 MG tablet Take 1 tablet by mouth daily. 06/07/20   [provider]  SPIRIVA RESPIMAT 2.5 MCG/ACT AERS Inhale 2 puffs into the lungs daily. 05/31/20   [provider]  triamcinolone (KENALOG) 0.025 % ointment Apply 1 application topically 2 (two) times daily. Apply to face and arms sparingly for dryness 05/24/20   [provider]    Allergies    Patient has no known allergies.  Review of Systems   Review of Systems  Constitutional: Negative for chills and fever.  HENT: Negative for congestion and sore throat.   Eyes: Negative.   Respiratory: Positive for cough, chest tightness, shortness of breath and wheezing.   Cardiovascular: Negative for chest pain and leg swelling.  Gastrointestinal: Negative for abdominal pain, nausea and vomiting.  Genitourinary: Negative.   Musculoskeletal: Negative for arthralgias, joint swelling and neck pain.  Skin: Negative.  Negative for rash and wound.  Neurological: Negative for dizziness, weakness, light-headedness, numbness and headaches.  Psychiatric/Behavioral: Negative.   All other systems reviewed and are negative.   Physical Exam Updated Vital Signs BP (!) 158/80 (BP Location: Left Arm)   Pulse 80   Temp 97.8 F (36.6 C) (Oral)   Resp 18   Ht _0  (1.803 m)   Wt 79 kg   SpO2 98%   BMI 24.29 kg/m   Physical Exam Vitals and nursing note reviewed.  Constitutional:      Appearance: He is well-developed.  HENT:     Head: Normocephalic and atraumatic.  Eyes:     Conjunctiva/sclera: Conjunctivae normal.  Cardiovascular:     Rate and Rhythm: Normal rate and regular rhythm.     Heart sounds: Normal heart sounds.  Pulmonary:     Effort: Pulmonary effort is normal.     Breath sounds: Wheezing present.     Comments: Expiratory wheeze with prolonged expiration throughout all lung fields.  He does have a wet sounding cough. Abdominal:     General: Bowel sounds are normal.     Palpations:  Abdomen is soft.     Tenderness: There is no abdominal tenderness.  Musculoskeletal:        General: Normal range of motion.     Cervical back: Normal range of motion.  Skin:    General: Skin is warm and dry.  Neurological:     Mental Status:  He is alert.     ED Results / Procedures / Treatments   Labs (all labs ordered are listed, but only abnormal results are displayed) Labs Reviewed  CBC WITH DIFFERENTIAL/PLATELET - Abnormal; Notable for the following components:      Result Value   MCV 100.2 (*)    Lymphs Abs 0.5 (*)    All other components within normal limits  BASIC METABOLIC PANEL - Abnormal; Notable for the following components:   Chloride 90 (*)    CO2 36 (*)    Glucose, Bld 105 (*)    All other components within normal limits  RESP PANEL BY RT-PCR (FLU A&B, COVID) ARPGX2    EKG None  Radiology DG Chest 1 View  Result Date: 07/15/2020 CLINICAL DATA:  Cough x3 weeks. EXAM: CHEST  1 VIEW COMPARISON:  Jul 12, 2018 FINDINGS: There is stable left-sided venous Port-A-Cath positioning. The lungs are hyperinflated. There is no evidence of acute infiltrate, pleural effusion or pneumothorax. The heart size and mediastinal contours are within normal limits. Moderate severity calcification of the aortic arch is seen. The visualized skeletal structures are unremarkable. IMPRESSION: Stable exam without acute cardiopulmonary disease. Electronically Signed   By: Virgina Norfolk M.D.   On: 07/15/2020 15:11    Procedures Procedures   Medications Ordered in ED Medications  albuterol (VENTOLIN HFA) 108 (90 Base) MCG/ACT inhaler 4 puff (4 puffs Inhalation Given 07/15/20 1503)  predniSONE (DELTASONE) tablet 50 mg (has no administration in time range)  AeroChamber Plus Flo-Vu Medium MISC 1 each (1 each Other Given 07/15/20 1504)    ED Course  I have reviewed the triage vital signs and the nursing notes.  Pertinent labs & imaging results that were available during my care of the  patient were reviewed by me and considered in my medical decision making (see chart for details).  Clinical Course as of 07/15/20 1635  Thu Jul 15, 2020  1534 Covid negative.  CXR no pneumonia. [JI]    Clinical Course User Index [JI] Landis Martins   MDM Rules/Calculators/A&P                            Labs and imaging reviewed and discussed with patient.  He was given an albuterol MDI treatment using a spacer and his wheezing completely resolved and he reported feeling better with his breathing along with reduced frequency of cough.  He was given a prednisone pulse dosing of 50 mg to take for an additional 5 days with first dose given here.  Advised to continue with Spiriva, but he needs to use his albuterol MDI every 4 hours as needed for coughing or wheezing.  As needed follow-up anticipated, return precautions were outlined.  Oxygen saturations remained stable during ED visit, no respiratory distress.  The patient appears reasonably screened and/or stabilized for discharge and I doubt any other medical condition or other [facility] requiring further screening, evaluation, or treatment in the ED at this time prior to discharge.     Ian Aguilar was evaluated in Emergency Department on 07/15/2020 for the symptoms described in the history of present illness. He was evaluated in the context of the global COVID-19 pandemic, which necessitated consideration that the patient might be at risk for infection with the SARS-CoV-2 virus that causes COVID-19. Institutional protocols and algorithms that pertain to the evaluation of patients at risk for COVID-19 are in a state of rapid change based on information released  by regulatory bodies including the CDC and federal and state organizations. These policies and algorithms were followed during the patient's care in the ED.  Final Clinical Impression(s) / ED Diagnoses Final diagnoses:  Chronic obstructive pulmonary disease with acute exacerbation ([place])     Rx / DC Orders ED Discharge Orders         Ordered    predniSONE (DELTASONE) 50 MG tablet        07/15/20 1631           Evalee Jefferson, PA-C 07/15/20 1635    Fredia Sorrow, MD 07/16/20 1815

## 2020-07-15 NOTE — ED Notes (Signed)
Report called to [place], Rn at [facility] at this time. Patient will be picked up around 6:00pm

## 2020-07-15 NOTE — ED Triage Notes (Signed)
Per [facility] pt is coughing and its getting worse. Pt alert and oriented, skin warm and dry.

## 2020-07-15 NOTE — ED Notes (Signed)
Son, Jeneen Rinks updated on patient plan of care at this time.

## 2020-07-19 ENCOUNTER — Emergency Department (HOSPITAL_COMMUNITY): Payer: PPO

## 2020-07-19 ENCOUNTER — Emergency Department (HOSPITAL_COMMUNITY)
Admission: EM | Admit: 2020-07-19 | Discharge: 2020-07-20 | Disposition: A | Discharge status: Home / Self Care | Payer: PPO | Attending: Emergency Medicine | Admitting: Emergency Medicine

## 2020-07-19 ENCOUNTER — Encounter (HOSPITAL_COMMUNITY): Payer: Self-pay | Admitting: Emergency Medicine

## 2020-07-19 DIAGNOSIS — S0083XA Contusion of other part of head, initial encounter: Secondary | ICD-10-CM | POA: Diagnosis not present

## 2020-07-19 DIAGNOSIS — E039 Hypothyroidism, unspecified: Secondary | ICD-10-CM | POA: Insufficient documentation

## 2020-07-19 DIAGNOSIS — R410 Disorientation, unspecified: Secondary | ICD-10-CM | POA: Diagnosis not present

## 2020-07-19 DIAGNOSIS — I639 Cerebral infarction, unspecified: Secondary | ICD-10-CM | POA: Diagnosis not present

## 2020-07-19 DIAGNOSIS — J441 Chronic obstructive pulmonary disease with (acute) exacerbation: Secondary | ICD-10-CM | POA: Insufficient documentation

## 2020-07-19 DIAGNOSIS — R4182 Altered mental status, unspecified: Secondary | ICD-10-CM | POA: Insufficient documentation

## 2020-07-19 DIAGNOSIS — J449 Chronic obstructive pulmonary disease, unspecified: Secondary | ICD-10-CM | POA: Diagnosis not present

## 2020-07-19 DIAGNOSIS — X58XXXA Exposure to other specified factors, initial encounter: Secondary | ICD-10-CM | POA: Insufficient documentation

## 2020-07-19 DIAGNOSIS — Z87891 Personal history of nicotine dependence: Secondary | ICD-10-CM | POA: Diagnosis not present

## 2020-07-19 DIAGNOSIS — R4020 Unspecified coma: Secondary | ICD-10-CM | POA: Diagnosis not present

## 2020-07-19 DIAGNOSIS — S0003XA Contusion of scalp, initial encounter: Secondary | ICD-10-CM | POA: Diagnosis not present

## 2020-07-19 DIAGNOSIS — R531 Weakness: Secondary | ICD-10-CM | POA: Diagnosis not present

## 2020-07-19 DIAGNOSIS — Z85118 Personal history of other malignant neoplasm of bronchus and lung: Secondary | ICD-10-CM | POA: Insufficient documentation

## 2020-07-19 DIAGNOSIS — J439 Emphysema, unspecified: Secondary | ICD-10-CM | POA: Diagnosis not present

## 2020-07-19 DIAGNOSIS — R404 Transient alteration of awareness: Secondary | ICD-10-CM | POA: Diagnosis not present

## 2020-07-19 DIAGNOSIS — Z79899 Other long term (current) drug therapy: Secondary | ICD-10-CM | POA: Insufficient documentation

## 2020-07-19 DIAGNOSIS — G319 Degenerative disease of nervous system, unspecified: Secondary | ICD-10-CM | POA: Diagnosis not present

## 2020-07-19 DIAGNOSIS — S0990XA Unspecified injury of head, initial encounter: Secondary | ICD-10-CM | POA: Diagnosis present

## 2020-07-19 DIAGNOSIS — Z96642 Presence of left artificial hip joint: Secondary | ICD-10-CM | POA: Insufficient documentation

## 2020-07-19 DIAGNOSIS — I10 Essential (primary) hypertension: Secondary | ICD-10-CM | POA: Insufficient documentation

## 2020-07-19 DIAGNOSIS — C3490 Malignant neoplasm of unspecified part of unspecified bronchus or lung: Secondary | ICD-10-CM | POA: Diagnosis not present

## 2020-07-19 LAB — CBC WITH DIFFERENTIAL/PLATELET
Abs Immature Granulocytes: 0.02 10*3/uL (ref 0.00–0.07)
Basophils Absolute: 0 10*3/uL (ref 0.0–0.1)
Basophils Relative: 0 %
Eosinophils Absolute: 0 10*3/uL (ref 0.0–0.5)
Eosinophils Relative: 0 %
HCT: 45.5 % (ref 39.0–52.0)
Hemoglobin: 14.1 g/dL (ref 13.0–17.0)
Immature Granulocytes: 0 %
Lymphocytes Relative: 8 %
Lymphs Abs: 0.4 10*3/uL — ABNORMAL LOW (ref 0.7–4.0)
MCH: 31.3 pg (ref 26.0–34.0)
MCHC: 31 g/dL (ref 30.0–36.0)
MCV: 101.1 fL — ABNORMAL HIGH (ref 80.0–100.0)
Monocytes Absolute: 0.1 10*3/uL (ref 0.1–1.0)
Monocytes Relative: 2 %
Neutro Abs: 4.6 10*3/uL (ref 1.7–7.7)
Neutrophils Relative %: 90 %
Platelets: 229 10*3/uL (ref 150–400)
RBC: 4.5 MIL/uL (ref 4.22–5.81)
RDW: 13.5 % (ref 11.5–15.5)
WBC: 5.1 10*3/uL (ref 4.0–10.5)
nRBC: 0 % (ref 0.0–0.2)

## 2020-07-19 LAB — URINALYSIS, ROUTINE W REFLEX MICROSCOPIC
Bilirubin Urine: NEGATIVE
Glucose, UA: NEGATIVE mg/dL
Hgb urine dipstick: NEGATIVE
Ketones, ur: 5 mg/dL — AB
Leukocytes,Ua: NEGATIVE
Nitrite: NEGATIVE
Protein, ur: NEGATIVE mg/dL
Specific Gravity, Urine: 1.017 (ref 1.005–1.030)
pH: 7 (ref 5.0–8.0)

## 2020-07-19 LAB — COMPREHENSIVE METABOLIC PANEL
ALT: 16 U/L (ref 0–44)
AST: 22 U/L (ref 15–41)
Albumin: 3.8 g/dL (ref 3.5–5.0)
Alkaline Phosphatase: 90 U/L (ref 38–126)
Anion gap: 8 (ref 5–15)
BUN: 16 mg/dL (ref 8–23)
CO2: 37 mmol/L — ABNORMAL HIGH (ref 22–32)
Calcium: 9.2 mg/dL (ref 8.9–10.3)
Chloride: 91 mmol/L — ABNORMAL LOW (ref 98–111)
Creatinine, Ser: 0.8 mg/dL (ref 0.61–1.24)
GFR, Estimated: >60 mL/min (ref >60)
Glucose, Bld: 126 mg/dL — ABNORMAL HIGH (ref 70–99)
Potassium: 4.5 mmol/L (ref 3.5–5.1)
Sodium: 136 mmol/L (ref 135–145)
Total Bilirubin: 0.6 mg/dL (ref 0.3–1.2)
Total Protein: 7.4 g/dL (ref 6.5–8.1)

## 2020-07-19 NOTE — ED Provider Notes (Signed)
[facility] EMERGENCY DEPARTMENT Provider Note   CSN: [id number] Arrival date & time: 07/19/20  1341     History Chief Complaint  Patient presents with  . Altered Mental Status    Ian Aguilar is a 80 y.o. male presenting for evaluation of altered mental status.  Patient states he does not know why he was sent here, states he is feeling well.  On further investigation, he reports he is maybe feeling a little bit more tired than usual, does not know when this started.  He denies fevers, cough, chest pain, shortness of breath, nausea, vomiting, abd pain, urinary symptoms, normal bowel movements.    Additional history obtained from triage note.  Per triage note, EMS was called out for unresponsiveness.  On arrival, patient was asleep in a recliner and able to be aroused without difficulty.  Per facility staff, on regular rounds patient was found to be cold and clammy and sweaty, not responding appropriately.  However on a second person's evaluation, patient was not responsive.  Per staff, he reported new r sided abdominal pain prior to coming to the hospital. Pt is mostly wheelchair bound, but can walk with assistance small amounts.   Per chart review, patient with a history of anxiety, arthritis, cancer, cirrhosis, COPD, depression, alcohol use, hypertension, hyperlipidemia  HPI     Past Medical History:  Diagnosis Date  . Anxiety   . Arthritis   . Cancer ([place])   . Cirrhosis ([place])    confirmed by MRI on 07/04/11.    Marland Kitchen COPD (chronic obstructive pulmonary disease) ([place])   . Depression with anxiety   . ETOH abuse   . Hyperlipidemia   . Hypertension    diet control  . Nodule of upper lobe of right lung    with mediastinal adenopathy  . Wears dentures   . Wears glasses     Patient Active Problem List   Diagnosis Date Noted  . Neurocognitive deficits 04/29/2020  . Hyperlipidemia LDL goal <100 04/27/2020  . Metacarpal bone fracture 04/27/2020  . Aortic atherosclerosis ([place])  04/27/2020  . Chronic constipation 04/27/2020  . Severe hypothyroidism 04/22/2020  . Distention of biliary tract 04/21/2020  . AKI (acute kidney injury) ([place]) 04/21/2020  . Hypothyroidism 04/21/2020  . Abdominal distension 04/20/2020  . Acute encephalopathy 10/23/2019  . Dehydration 10/23/2019  . COPD with acute exacerbation ([place]) 10/23/2019  . Chronic hypoxemic respiratory failure ([place]) 10/23/2019  . Hypertensive urgency 10/23/2019  . Goals of care, counseling/discussion   . Palliative care by specialist   . DNR (do not resuscitate) discussion   . Malignant neoplasm of bronchus and lung ([place])   . Carcinoma, lung ([place]) 06/05/2017  . History of colonic polyps 04/06/2017  . History of snoring 09/07/2015  . Fracture of ulnar styloid   . Benign essential HTN   . Faintness   . Thrombocytopenia ([place]) 09/03/2015  . Hyponatremia 09/03/2015  . Closed displaced intertrochanteric fracture of right femur ([place])   . Distal radius fracture, right 09/02/2015  . Fracture of right hip requiring operative repair ([place]) 09/01/2015  . Alcohol abuse 09/01/2015  . Tobacco abuse 09/01/2015  . COPD (chronic obstructive pulmonary disease) 09/01/2015  . 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 Weakness of left leg 01/10/2012  . Difficulty in walking(719.7) 01/10/2012  . Pain in left hip 01/10/2012  . Acute blood loss anemia 12/21/2011  . Cirrhosis- imaging diagnosis 2013 09/08/2011  . Umbilical hernia 58/52/7782  . 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  . Hepatomegaly  06/14/2011  . Encounter for screening colonoscopy 06/14/2011  . JOINT EFFUSION, KNEE 02/10/2008  . KNEE PAIN 02/10/2008    Past Surgical History:  Procedure Laterality Date  . bilateral cataract surgery     [place]  . broken arm  6 yrs ago   left otif of wrist-[place]  . CLOSED REDUCTION WRIST FRACTURE Right 09/02/2015   Procedure: RIGHT WRIST REDUCTION;  Surgeon: Renette Butters, MD;  Location: [place];  Service: Orthopedics;   Laterality: Right;  with MAC  . COLONOSCOPY  1996   [place]: external hemorrhoids, no polyps  . COLONOSCOPY  06/28/2011   Dr. Ala Bent diverticulosis,tubular adenoma, hyperplastic polyp  . COLONOSCOPY WITH PROPOFOL N/A 04/26/2017   Dr. Gala Romney: perianal and digital rectal examinations normal, diffusely congested colonic mucosa but otherwise normal  . ESOPHAGOGASTRODUODENOSCOPY  06/28/2011   Dr. Chelsea Aus erosive reflux esophagitis, hiatal hernia-gastritis  . ESOPHAGOGASTRODUODENOSCOPY (EGD) WITH PROPOFOL N/A 04/26/2017   Dr. Gala Romney: normal esophagus, small hiatal hernia, GAVE, portal hypertensive gastropathy, normal duodenal bulb and second portion, no specimens collected. 2 years screening   . ESOPHAGOGASTRODUODENOSCOPY (EGD) WITH PROPOFOL N/A 06/19/2019   normal esophagus, portal gastropathy, GAVE, normal duodenum. 2 years screening.   Marland Kitchen EXTERNAL EAR SURGERY     right ear-cleaned out ear and created new eardrum  . INGUINAL HERNIA REPAIR  2-3 yrs ago   left-[place]  . INTRAMEDULLARY (IM) NAIL INTERTROCHANTERIC Right 09/02/2015   Procedure: RIGHT  INTERTROCHANTRIC HIP;  Surgeon: Renette Butters, MD;  Location: [place];  Service: Orthopedics;  Laterality: Right;  With MAC  . KNEE SURGERY     left-arthroscopy-[place]  . PORTACATH PLACEMENT Left 06/08/2017   Procedure: INSERTION POWER PORT WITH  ATTACHED CATHETER LEFT SUBCLAVIAN;  Surgeon: Aviva Signs, MD;  Location: [facility];  Service: General;  Laterality: Left;  . SKULL FRACTURE ELEVATION     fractured cheeck bone repaired  . TOTAL HIP ARTHROPLASTY  12/18/2011   Procedure: TOTAL HIP ARTHROPLASTY;  Surgeon: Carole Civil, MD;  Location: [facility];  Service: Orthopedics;  Laterality: Left;  Marland Kitchen VIDEO BRONCHOSCOPY WITH ENDOBRONCHIAL ULTRASOUND N/A 05/04/2017   Procedure: VIDEO BRONCHOSCOPY WITH ENDOBRONCHIAL ULTRASOUND;  Surgeon: Melrose Nakayama, MD;  Location[facility];  Service: Thoracic;  Laterality: N/A;  . WISDOM TOOTH EXTRACTION          Family History  Problem Relation Age of Onset  . Stroke Mother 54  . Heart disease Father 94       MI  . Diabetes Maternal Uncle   . Colon cancer Neg Hx   . Cancer Neg Hx     Social History   Tobacco Use  . Smoking status: Former Smoker    Packs/day: 0.00    Years: 62.00    Pack years: 0.00    Types: Cigarettes    Quit date: 02/28/2019    Years since quitting: 1.3  . Smokeless tobacco: Never Used  . Tobacco comment: 1/2 ppd > 60 years as of 06/27/17: 4-5 cigarettes or less a day  Vaping Use  . Vaping Use: Never used  Substance Use Topics  . Alcohol use: Yes    Alcohol/week: 2.0 standard drinks    Types: 2 Cans of beer per week  . Drug use: No    Home Medications Prior to Admission medications   Medication Sig Start Date End Date Taking? Authorizing Provider  cholecalciferol (VITAMIN D) 1000 units tablet Take 1,000 Units by mouth daily.   Yes [provider]  docusate sodium (COLACE)  100 MG capsule Take 1 capsule (100 mg total) by mouth 2 (two) times daily. 04/23/20 04/23/21 Yes Lavina Hamman, MD  GOODSENSE ARTIFICIAL TEARS 0.5-0.6 % SOLN Apply to eye. 05/24/20  Yes [provider]  guaiFENesin (MUCINEX) 600 MG 12 hr tablet Take 600 mg by mouth 2 (two) times daily as needed for cough (congestion).   Yes [provider]  levothyroxine (SYNTHROID) 125 MCG tablet Take 1 tablet (125 mcg total) by mouth daily at 6 (six) AM. 05/21/20  Yes Green, Phylis Bougie, NP  polyethylene glycol (MIRALAX / GLYCOLAX) 17 g packet Take 17 g by mouth daily. 04/23/20  Yes Lavina Hamman, MD  predniSONE (DELTASONE) 50 MG tablet Take one tablet daily for 5 days 07/15/20  Yes Idol, Almyra Free, PA-C  rosuvastatin (CRESTOR) 10 MG tablet Take 1 tablet (10 mg total) by mouth daily. 05/21/20  Yes Gerlene Fee, NP  sertraline (ZOLOFT) 50 MG tablet Take 1 tablet by mouth daily. 06/07/20  Yes [provider]  SPIRIVA RESPIMAT 2.5 MCG/ACT AERS Inhale 2 puffs into the lungs  daily. 05/31/20  Yes [provider]  triamcinolone (KENALOG) 0.025 % ointment Apply 1 application topically 2 (two) times daily. Apply to face and arms sparingly for dryness 05/24/20  Yes [provider]  ciprofloxacin (CIPRO) 500 MG tablet Take 500 mg by mouth 2 (two) times daily. Patient not taking: No sig reported 06/30/20   [provider]  feeding supplement (ENSURE ENLIVE / ENSURE PLUS) LIQD Take 237 mLs by mouth 2 (two) times daily between meals. Patient not taking: No sig reported 04/27/20   [provider]  NON FORMULARY Diet NAS 04/23/20   [provider]  OXYGEN Inhale 2 L into the lungs continuous. 04/24/20   [provider]    Allergies    Patient has no known allergies.  Review of Systems   Review of Systems  Constitutional: Fatigue: more tired than normal.  Psychiatric/Behavioral: Positive for confusion.  All other systems reviewed and are negative.   Physical Exam Updated Vital Signs BP (!) 117/91   Pulse 67   Temp 98.2 F (36.8 C) (Oral)   Resp 16   Ht _0  (1.803 m)   Wt 87.1 kg   SpO2 98%   BMI 26.78 kg/m   Physical Exam Vitals and nursing note reviewed.  Constitutional:      General: He is not in acute distress.    Appearance: He is well-developed.     Comments: Patient is sleeping in the hallway, however arouses easily to voice.  HENT:     Head: Normocephalic.      Comments: Hematoma of right forehead with surrounding old contusion. Eyes:     Extraocular Movements: Extraocular movements intact.     Conjunctiva/sclera: Conjunctivae normal.     Comments: R pupil different shape than left.   Cardiovascular:     Rate and Rhythm: Normal rate and regular rhythm.     Pulses: Normal pulses.  Pulmonary:     Effort: Pulmonary effort is normal. No respiratory distress.     Breath sounds: Normal breath sounds. No wheezing.  Abdominal:     General: There is no distension.     Palpations: Abdomen is soft.  There is no mass.     Tenderness: There is no abdominal tenderness. There is no guarding or rebound.     Comments: No ttp of abd.  Musculoskeletal:        General: Normal range of motion.  Cervical back: Normal range of motion and neck supple.  Skin:    General: Skin is warm and dry.     Capillary Refill: Capillary refill takes less than 2 seconds.  Neurological:     Mental Status: He is alert and oriented to person, place, and time.     GCS: GCS eye subscore is 4. GCS verbal subscore is 5. GCS motor subscore is 6.     Comments: Alert and oriented.     ED Results / Procedures / Treatments   Labs (all labs ordered are listed, but only abnormal results are displayed) Labs Reviewed  CBC WITH DIFFERENTIAL/PLATELET - Abnormal; Notable for the following components:      Result Value   MCV 101.1 (*)    Lymphs Abs 0.4 (*)    All other components within normal limits  COMPREHENSIVE METABOLIC PANEL - Abnormal; Notable for the following components:   Chloride 91 (*)    CO2 37 (*)    Glucose, Bld 126 (*)    All other components within normal limits  URINALYSIS, ROUTINE W REFLEX MICROSCOPIC - Abnormal; Notable for the following components:   Ketones, ur 5 (*)    All other components within normal limits    EKG EKG Interpretation  Date/Time:  Monday Jul 19 2020 16:03:39 EDT Ventricular Rate:  65 PR Interval:  214 QRS Duration: 116 QT Interval:  436 QTC Calculation: 453 R Axis:   89 Text Interpretation: Sinus rhythm with 1st degree A-V block Low voltage QRS Incomplete right bundle branch block Cannot rule out Anterior infarct , age undetermined Abnormal ECG No significant change since last tracing Confirmed by Calvert Cantor [id number]) on 07/19/2020 4:19:20 PM   Radiology DG Chest 2 View  Result Date: 07/19/2020 CLINICAL DATA:  Weakness, unresponsive, history hypertension, COPD, former smoker, hypertension, cirrhosis, lung cancer EXAM: CHEST - 2 VIEW COMPARISON:  07/15/2020  FINDINGS: LEFT subclavian Port-A-Cath with tip projecting over SVC. Normal heart size, mediastinal contours, and pulmonary vascularity. Atherosclerotic calcification aorta. Emphysematous and minimal bronchitic changes consistent with COPD. No acute infiltrate, pleural effusion, or pneumothorax. Bones demineralized. IMPRESSION: COPD changes without acute abnormalities. Aortic Atherosclerosis (ICD10-I70.0) and Emphysema (ICD10-J43.9). Electronically Signed   By: Lavonia Dana M.D.   On: 07/19/2020 17:34   CT Head Wo Contrast  Result Date: 07/19/2020 CLINICAL DATA:  Head trauma, minor. Additional history provided: Patient unresponsive, history of hypertension. EXAM: CT HEAD WITHOUT CONTRAST TECHNIQUE: Contiguous axial images were obtained from the base of the skull through the vertex without intravenous contrast. COMPARISON:  Prior head CT examinations 07/11/2020 and earlier. FINDINGS: Brain: Mild-to-moderate cerebral atrophy. Mild-to-moderate patchy and ill-defined hypoattenuation within the cerebral white matter, nonspecific but compatible with chronic small vessel ischemic disease. A known small chronic infarct within the right cerebellum was better appreciated on the brain MRI of 10/23/2019. There is no acute intracranial hemorrhage. No demarcated cortical infarct. No extra-axial fluid collection. No evidence of intracranial mass. No midline shift. Vascular: No hyperdense vessel.  Atherosclerotic calcifications. Skull: Normal. Negative for fracture or focal lesion. Sinuses/Orbits: Visualized orbits show no acute finding. Mild paranasal sinus mucosal thickening at the imaged levels, most notably right maxillary. Other: Right mastoid effusion.  Right anterior scalp hematoma. IMPRESSION: No evidence of acute intracranial abnormality. Right anterior scalp hematoma. Stable mild-to-moderate cerebral atrophy and cerebral white matter chronic small vessel ischemic disease. Known small chronic right cerebellar infarct.  Mild paranasal sinus mucosal thickening at the imaged levels, most notably right maxillary. Right mastoid effusion. Electronically Signed  By: Kellie Simmering DO   On: 07/19/2020 17:02    Procedures Procedures   Medications Ordered in ED Medications - No data to display  ED Course  I have reviewed the triage vital signs and the nursing notes.  Pertinent labs & imaging results that were available during my care of the patient were reviewed by me and considered in my medical decision making (see chart for details).    MDM Rules/Calculators/A&P                          Patient presented for evaluation of decreased energy and may be intermittent confusion.  History is difficult to be obtained as patient reports no complaints, facility history is different than family history.  Ultimately, it seems patient has had mental decline over the past several weeks with intermittent episodes of confusion.  He has had multiple ER visits without obvious findings.  On exam, patient appears nontoxic.  He is alert and oriented, and has no complaints.  However in the setting of possible unresponsiveness/worsening confusion, will obtain labs, urine, chest x-ray, CT head.  Labs interpreted by me, overall reassuring.  No leukocytosis.  Electrolytes are stable.  CT head negative for acute findings, shows chronic changes.  Chest x-ray viewed and independently interpreted by me, no pneumonia pneumothorax and effusion.  EKG is nonischemic.  Urine negative for infection.  Discussed findings separately with patient's son and daughter over the phone.  Discussed that at this time, there does not appear to be an acute or life-threatening condition requiring hospitalization.  Instead, I encouraged outpatient work-up, neurology information given.  Encouraged PCP follow-up.  At this time, patient proceed for discharge.  Return precautions given.  Patient and family state they understand and agree to plan.  Final Clinical  Impression(s) / ED Diagnoses Final diagnoses:  Transient confusion    Rx / DC Orders ED Discharge Orders    None       Franchot Heidelberg, PA-C 07/19/20 [id number]    Truddie Hidden, MD 07/19/20 [phone]

## 2020-07-19 NOTE — ED Notes (Signed)
Unable to sign MSE d/t sig pad not working

## 2020-07-19 NOTE — ED Notes (Signed)
Pt unable to provide urine sample.

## 2020-07-19 NOTE — ED Notes (Signed)
Patient transported to CT 

## 2020-07-19 NOTE — ED Notes (Signed)
Pt to CT at this time.

## 2020-07-19 NOTE — ED Notes (Signed)
Pt son called and given update.

## 2020-07-19 NOTE — ED Triage Notes (Signed)
Per [facility], pt is from [place] and the facility called out for the patient being unresponsive. When EMS arrived pt was asleep in his reliner chair and easy to arouse. Pt is awake and denies pain.

## 2020-07-19 NOTE — ED Notes (Signed)
Urine sample requested.

## 2020-07-20 DIAGNOSIS — Z7401 Bed confinement status: Secondary | ICD-10-CM | POA: Diagnosis not present

## 2020-07-20 DIAGNOSIS — R404 Transient alteration of awareness: Secondary | ICD-10-CM | POA: Diagnosis not present

## 2020-07-20 DIAGNOSIS — J439 Emphysema, unspecified: Secondary | ICD-10-CM | POA: Diagnosis not present

## 2020-07-20 DIAGNOSIS — R531 Weakness: Secondary | ICD-10-CM | POA: Diagnosis not present

## 2020-07-20 DIAGNOSIS — U071 COVID-19: Secondary | ICD-10-CM | POA: Diagnosis not present

## 2020-07-20 DIAGNOSIS — C3490 Malignant neoplasm of unspecified part of unspecified bronchus or lung: Secondary | ICD-10-CM | POA: Diagnosis not present

## 2020-07-20 DIAGNOSIS — J449 Chronic obstructive pulmonary disease, unspecified: Secondary | ICD-10-CM | POA: Diagnosis not present

## 2020-07-20 DIAGNOSIS — Z20828 Contact with and (suspected) exposure to other viral communicable diseases: Secondary | ICD-10-CM | POA: Diagnosis not present

## 2020-07-23 DIAGNOSIS — E860 Dehydration: Secondary | ICD-10-CM | POA: Diagnosis not present

## 2020-07-23 DIAGNOSIS — Z79899 Other long term (current) drug therapy: Secondary | ICD-10-CM | POA: Diagnosis not present

## 2020-07-23 DIAGNOSIS — R4182 Altered mental status, unspecified: Secondary | ICD-10-CM | POA: Diagnosis not present

## 2020-07-23 DIAGNOSIS — E063 Autoimmune thyroiditis: Secondary | ICD-10-CM | POA: Diagnosis not present

## 2020-07-23 DIAGNOSIS — E663 Overweight: Secondary | ICD-10-CM | POA: Diagnosis not present

## 2020-07-23 DIAGNOSIS — S0990XA Unspecified injury of head, initial encounter: Secondary | ICD-10-CM | POA: Diagnosis not present

## 2020-07-23 DIAGNOSIS — Z6825 Body mass index (BMI) 25.0-25.9, adult: Secondary | ICD-10-CM | POA: Diagnosis not present

## 2020-07-27 DIAGNOSIS — M169 Osteoarthritis of hip, unspecified: Secondary | ICD-10-CM | POA: Diagnosis not present

## 2020-07-27 DIAGNOSIS — E039 Hypothyroidism, unspecified: Secondary | ICD-10-CM | POA: Diagnosis not present

## 2020-07-27 DIAGNOSIS — J449 Chronic obstructive pulmonary disease, unspecified: Secondary | ICD-10-CM | POA: Diagnosis not present

## 2020-07-28 DIAGNOSIS — U071 COVID-19: Secondary | ICD-10-CM | POA: Diagnosis not present

## 2020-07-28 DIAGNOSIS — Z20828 Contact with and (suspected) exposure to other viral communicable diseases: Secondary | ICD-10-CM | POA: Diagnosis not present

## 2020-07-29 DIAGNOSIS — E039 Hypothyroidism, unspecified: Secondary | ICD-10-CM | POA: Diagnosis not present

## 2020-07-29 DIAGNOSIS — M6281 Muscle weakness (generalized): Secondary | ICD-10-CM | POA: Diagnosis not present

## 2020-07-29 DIAGNOSIS — K7031 Alcoholic cirrhosis of liver with ascites: Secondary | ICD-10-CM | POA: Diagnosis not present

## 2020-07-29 DIAGNOSIS — J441 Chronic obstructive pulmonary disease with (acute) exacerbation: Secondary | ICD-10-CM | POA: Diagnosis not present

## 2020-07-29 DIAGNOSIS — F0150 Vascular dementia without behavioral disturbance: Secondary | ICD-10-CM | POA: Diagnosis not present

## 2020-07-29 DIAGNOSIS — I69318 Other symptoms and signs involving cognitive functions following cerebral infarction: Secondary | ICD-10-CM | POA: Diagnosis not present

## 2020-07-29 DIAGNOSIS — Z87891 Personal history of nicotine dependence: Secondary | ICD-10-CM | POA: Diagnosis not present

## 2020-07-29 DIAGNOSIS — J9611 Chronic respiratory failure with hypoxia: Secondary | ICD-10-CM | POA: Diagnosis not present

## 2020-08-02 DIAGNOSIS — E039 Hypothyroidism, unspecified: Secondary | ICD-10-CM | POA: Diagnosis not present

## 2020-08-02 DIAGNOSIS — Z7951 Long term (current) use of inhaled steroids: Secondary | ICD-10-CM | POA: Diagnosis not present

## 2020-08-02 DIAGNOSIS — J449 Chronic obstructive pulmonary disease, unspecified: Secondary | ICD-10-CM | POA: Diagnosis not present

## 2020-08-02 DIAGNOSIS — R410 Disorientation, unspecified: Secondary | ICD-10-CM | POA: Diagnosis not present

## 2020-08-02 DIAGNOSIS — K7030 Alcoholic cirrhosis of liver without ascites: Secondary | ICD-10-CM | POA: Diagnosis not present

## 2020-08-02 DIAGNOSIS — D692 Other nonthrombocytopenic purpura: Secondary | ICD-10-CM | POA: Diagnosis not present

## 2020-08-02 DIAGNOSIS — I739 Peripheral vascular disease, unspecified: Secondary | ICD-10-CM | POA: Diagnosis not present

## 2020-08-02 DIAGNOSIS — C3411 Malignant neoplasm of upper lobe, right bronchus or lung: Secondary | ICD-10-CM | POA: Diagnosis not present

## 2020-08-02 DIAGNOSIS — Z9981 Dependence on supplemental oxygen: Secondary | ICD-10-CM | POA: Diagnosis not present

## 2020-08-02 DIAGNOSIS — F1021 Alcohol dependence, in remission: Secondary | ICD-10-CM | POA: Diagnosis not present

## 2020-08-03 DIAGNOSIS — Z20828 Contact with and (suspected) exposure to other viral communicable diseases: Secondary | ICD-10-CM | POA: Diagnosis not present

## 2020-08-03 DIAGNOSIS — U071 COVID-19: Secondary | ICD-10-CM | POA: Diagnosis not present

## 2020-08-06 DIAGNOSIS — J449 Chronic obstructive pulmonary disease, unspecified: Secondary | ICD-10-CM | POA: Diagnosis not present

## 2020-08-10 DIAGNOSIS — Z20828 Contact with and (suspected) exposure to other viral communicable diseases: Secondary | ICD-10-CM | POA: Diagnosis not present

## 2020-08-10 DIAGNOSIS — U071 COVID-19: Secondary | ICD-10-CM | POA: Diagnosis not present

## 2020-08-16 ENCOUNTER — Encounter: Payer: Self-pay | Admitting: Physician Assistant

## 2020-08-17 DIAGNOSIS — Z20828 Contact with and (suspected) exposure to other viral communicable diseases: Secondary | ICD-10-CM | POA: Diagnosis not present

## 2020-08-17 DIAGNOSIS — U071 COVID-19: Secondary | ICD-10-CM | POA: Diagnosis not present

## 2020-08-18 DIAGNOSIS — J441 Chronic obstructive pulmonary disease with (acute) exacerbation: Secondary | ICD-10-CM | POA: Diagnosis not present

## 2020-08-18 DIAGNOSIS — J9611 Chronic respiratory failure with hypoxia: Secondary | ICD-10-CM | POA: Diagnosis not present

## 2020-08-18 DIAGNOSIS — I69318 Other symptoms and signs involving cognitive functions following cerebral infarction: Secondary | ICD-10-CM | POA: Diagnosis not present

## 2020-08-18 DIAGNOSIS — K7031 Alcoholic cirrhosis of liver with ascites: Secondary | ICD-10-CM | POA: Diagnosis not present

## 2020-08-20 DIAGNOSIS — R531 Weakness: Secondary | ICD-10-CM | POA: Diagnosis not present

## 2020-08-20 DIAGNOSIS — C3490 Malignant neoplasm of unspecified part of unspecified bronchus or lung: Secondary | ICD-10-CM | POA: Diagnosis not present

## 2020-08-20 DIAGNOSIS — J449 Chronic obstructive pulmonary disease, unspecified: Secondary | ICD-10-CM | POA: Diagnosis not present

## 2020-08-20 DIAGNOSIS — J439 Emphysema, unspecified: Secondary | ICD-10-CM | POA: Diagnosis not present

## 2020-08-24 DIAGNOSIS — U071 COVID-19: Secondary | ICD-10-CM | POA: Diagnosis not present

## 2020-08-24 DIAGNOSIS — Z20828 Contact with and (suspected) exposure to other viral communicable diseases: Secondary | ICD-10-CM | POA: Diagnosis not present

## 2020-08-31 DIAGNOSIS — U071 COVID-19: Secondary | ICD-10-CM | POA: Diagnosis not present

## 2020-08-31 DIAGNOSIS — Z20828 Contact with and (suspected) exposure to other viral communicable diseases: Secondary | ICD-10-CM | POA: Diagnosis not present

## 2020-09-01 DIAGNOSIS — Z87891 Personal history of nicotine dependence: Secondary | ICD-10-CM | POA: Diagnosis not present

## 2020-09-01 DIAGNOSIS — I69318 Other symptoms and signs involving cognitive functions following cerebral infarction: Secondary | ICD-10-CM | POA: Diagnosis not present

## 2020-09-01 DIAGNOSIS — J9611 Chronic respiratory failure with hypoxia: Secondary | ICD-10-CM | POA: Diagnosis not present

## 2020-09-01 DIAGNOSIS — K7031 Alcoholic cirrhosis of liver with ascites: Secondary | ICD-10-CM | POA: Diagnosis not present

## 2020-09-01 DIAGNOSIS — J441 Chronic obstructive pulmonary disease with (acute) exacerbation: Secondary | ICD-10-CM | POA: Diagnosis not present

## 2020-09-01 DIAGNOSIS — F0150 Vascular dementia without behavioral disturbance: Secondary | ICD-10-CM | POA: Diagnosis not present

## 2020-09-01 DIAGNOSIS — M6281 Muscle weakness (generalized): Secondary | ICD-10-CM | POA: Diagnosis not present

## 2020-09-01 DIAGNOSIS — E039 Hypothyroidism, unspecified: Secondary | ICD-10-CM | POA: Diagnosis not present

## 2020-09-05 DIAGNOSIS — J449 Chronic obstructive pulmonary disease, unspecified: Secondary | ICD-10-CM | POA: Diagnosis not present

## 2020-09-07 DIAGNOSIS — U071 COVID-19: Secondary | ICD-10-CM | POA: Diagnosis not present

## 2020-09-07 DIAGNOSIS — Z20828 Contact with and (suspected) exposure to other viral communicable diseases: Secondary | ICD-10-CM | POA: Diagnosis not present

## 2020-09-14 DIAGNOSIS — U071 COVID-19: Secondary | ICD-10-CM | POA: Diagnosis not present

## 2020-09-14 DIAGNOSIS — Z20828 Contact with and (suspected) exposure to other viral communicable diseases: Secondary | ICD-10-CM | POA: Diagnosis not present

## 2020-09-15 DIAGNOSIS — H353131 Nonexudative age-related macular degeneration, bilateral, early dry stage: Secondary | ICD-10-CM | POA: Diagnosis not present

## 2020-09-17 DIAGNOSIS — E039 Hypothyroidism, unspecified: Secondary | ICD-10-CM | POA: Diagnosis not present

## 2020-09-19 DIAGNOSIS — C3490 Malignant neoplasm of unspecified part of unspecified bronchus or lung: Secondary | ICD-10-CM | POA: Diagnosis not present

## 2020-09-19 DIAGNOSIS — J439 Emphysema, unspecified: Secondary | ICD-10-CM | POA: Diagnosis not present

## 2020-09-19 DIAGNOSIS — J449 Chronic obstructive pulmonary disease, unspecified: Secondary | ICD-10-CM | POA: Diagnosis not present

## 2020-09-19 DIAGNOSIS — R531 Weakness: Secondary | ICD-10-CM | POA: Diagnosis not present

## 2020-09-21 ENCOUNTER — Other Ambulatory Visit (HOSPITAL_COMMUNITY): Payer: Self-pay | Admitting: Internal Medicine

## 2020-09-21 DIAGNOSIS — M1991 Primary osteoarthritis, unspecified site: Secondary | ICD-10-CM | POA: Diagnosis not present

## 2020-09-21 DIAGNOSIS — Z6821 Body mass index (BMI) 21.0-21.9, adult: Secondary | ICD-10-CM | POA: Diagnosis not present

## 2020-09-21 DIAGNOSIS — M25551 Pain in right hip: Secondary | ICD-10-CM

## 2020-09-21 DIAGNOSIS — E063 Autoimmune thyroiditis: Secondary | ICD-10-CM | POA: Diagnosis not present

## 2020-09-21 DIAGNOSIS — U071 COVID-19: Secondary | ICD-10-CM | POA: Diagnosis not present

## 2020-09-21 DIAGNOSIS — Z20828 Contact with and (suspected) exposure to other viral communicable diseases: Secondary | ICD-10-CM | POA: Diagnosis not present

## 2020-09-21 DIAGNOSIS — M7061 Trochanteric bursitis, right hip: Secondary | ICD-10-CM | POA: Diagnosis not present

## 2020-09-22 ENCOUNTER — Ambulatory Visit (HOSPITAL_COMMUNITY)
Admission: RE | Admit: 2020-09-22 | Discharge: 2020-09-22 | Disposition: A | Discharge status: Home / Self Care | Payer: PPO | Source: Ambulatory Visit | Attending: Internal Medicine | Admitting: Internal Medicine

## 2020-09-22 ENCOUNTER — Other Ambulatory Visit: Payer: Self-pay

## 2020-09-22 DIAGNOSIS — M25551 Pain in right hip: Secondary | ICD-10-CM | POA: Diagnosis not present

## 2020-09-26 DIAGNOSIS — M169 Osteoarthritis of hip, unspecified: Secondary | ICD-10-CM | POA: Diagnosis not present

## 2020-09-26 DIAGNOSIS — E039 Hypothyroidism, unspecified: Secondary | ICD-10-CM | POA: Diagnosis not present

## 2020-09-26 DIAGNOSIS — J449 Chronic obstructive pulmonary disease, unspecified: Secondary | ICD-10-CM | POA: Diagnosis not present

## 2020-09-26 NOTE — Progress Notes (Addendum)
Assessment/Plan:   Ian Aguilar is a 80 y.o. year old male with risk factors including  age, hypertension, hyperlipidemia, hypothyroidism, ASCAD, COPD on O2, Lung Ca, anxiety, depression and  remote history of alcohol abuse, seen today for evaluation of memory loss. 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  Side effects discussed  Discussed safety both in and out of the home.  Discussed the importance of regular daily schedule with inclusion of crossword puzzles to maintain brain function.  Continue to monitor mood with PCP.  Stay active at least 30 minutes at least 3 times a week.  Naps should be scheduled and should be no longer than 60 minutes and should not occur after 2 PM.  Mediterranean diet is recommended  Folllow up once results above are available   Subjective:    The patient is seen in neurologic consultation at the request of Redmond School, MD for the evaluation of memory.  The patient is accompanied by caregiver who supplements the history. He is a 80 y.o. year old RH  male who has had memory issues for about  several years, worse over the last 6 months, when he lost his wife.  At the time, he went into a deep depression, as he was planning to [facility] in [place], and he was going to take his dog with him.  On his wife's death, he felt "lost, did not want to get out of the bed, did not want to interact with anyone, and he was feeling  very irritable ".  Sertraline was tried, without any improvement.  It was during that time, that he was found to be severely hypothyroid with a TSH of 17, and was started on thyroid medication, which improved significantly his mood, and irritability.  He reports his memory being "not good, especially with long-term memory ".  He states that at times he cannot remember the names of his grandmother, grandfather, or children. While before he was sleeping the whole day, now he sleeps well at night, without vivid dreams or sleepwalking, paranoia or hallucinations.  Occasionally he takes a nap. He needs assistance with bathing and dressing, as his gait is chronically unstable.  Medications are given by the facility staff, and the finances are managed by his son and daughter who is the power of attorney.  Denies leaving objects in unusual places.  Appetite is better, denies trouble swallowing.  He does not cook.  "My wife used to do all the cooking".  He ambulates with the walker very short distances, otherwise he uses a wheelchair to help move from one side to the other, as he is very weak, and has an oxygen tank with him.  He does participate in physical therapy at the facility.  He denies any recent falls.  He no longer drives because of "memory issues ".  His son "was afraid that I would get lost so he took the keys".  He denies any headaches, although he had an injury to the head in  May 2022, with negative work-up at the ER.  He denies double vision, dizziness, focal numbness or tingling, unilateral weakness or tremors, urine incontinence or retention.  Denies constipation, diarrhea, or anosmia.  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  He quit heavy alcohol 10 years ago mostly beer and bourbon.  He quit 1 pack a day tobacco use for most of his life.  Family history remarkable for dementia in father.    Labs 07/19/20  CBC and CMP unremarkable UA neg  TSH 05/13/20 17.277 High, T4 1.28 elev B12  04/22/20 645 nl      No Known Allergies  Current Outpatient Medications  Medication Instructions   cholecalciferol (VITAMIN D) 1,000 Units, Oral, Daily   ciprofloxacin (CIPRO) 500 mg, 2 times daily   docusate sodium (COLACE) 100 mg, Oral, 2 times daily   donepezil (ARICEPT) 10 MG tablet Take half tablet (5 mg) daily for 2 weeks, then increase to the full tablet at 10 mg daily   feeding supplement (ENSURE ENLIVE / ENSURE PLUS) LIQD 237 mLs, Oral, 2 times daily between meals   GOODSENSE ARTIFICIAL TEARS 0.5-0.6 % SOLN Ophthalmic   guaiFENesin (MUCINEX) 600 mg, 2 times daily PRN   levothyroxine (SYNTHROID) 125 mcg, Oral, Daily   NON FORMULARY Diet NAS   OXYGEN 2 L, Inhalation, Continuous   polyethylene glycol (MIRALAX / GLYCOLAX) 17 g, Oral, Daily   predniSONE (DELTASONE) 50 MG tablet Take one tablet daily for 5 days   rosuvastatin (CRESTOR) 10 mg, Oral, Daily   sertraline (ZOLOFT) 50 MG tablet 1 tablet, Daily   SPIRIVA RESPIMAT 2.5 MCG/ACT AERS 2 puffs, Inhalation, Daily   triamcinolone (KENALOG) 0.025 % ointment 1 application, 2 times daily     VITALS:   Vitals:   09/27/20 0955  BP: 140/68  Pulse: 76  Resp: 20  SpO2: 96%  Weight: 174 lb (78.9 kg)  Height: 6' (1.829 m)   No flowsheet data found.  PHYSICAL EXAM   HEENT:  Normocephalic, atraumatic. The mucous membranes are moist. The superficial temporal arteries are without ropiness or tenderness. Cardiovascular: Regular rate and rhythm. Lungs: Clear to auscultation bilaterally. Neck: There are no carotid bruits noted bilaterally.  NEUROLOGICAL: Montreal Cognitive Assessment  09/27/2020  Visuospatial/ Executive (0/5) 0  Naming (0/3) 2  Attention: Read list of digits (0/2) 2  Attention: Read list of letters (0/1) 1  Attention: Serial 7 subtraction starting at 100 (0/3) 1  Language: Repeat phrase (0/2) 1  Language : Fluency (0/1) 0  Abstraction (0/2) 0  Delayed Recall (0/5) 0  Orientation (0/6) 4  Total 11   Adjusted Score (based on education) 11   No flowsheet data found.  No flowsheet data found.   Orientation:  Alert and oriented to person, place and not to time April , unable to tell date, only year. No aphasia or dysarthria. Fund of knowledge is appropriate. Recent memory impaired and remote memory impaired.  Attention and concentration are normal.  Able to name objects and repeat phrases. Delayed recall  0/5 Cranial nerves: There is good facial symmetry. Extraocular muscles are intact and visual fields are full to confrontational testing. Speech is fluent and clear. Soft palate rises symmetrically and there is no tongue deviation. Hearing is intact to conversational tone. Tone: Tone is good throughout. Sensation: Sensation is intact to light touch and pinprick throughout. Vibration is intact at the bilateral big toe.There is no extinction with double simultaneous stimulation. There is no sensory dermatomal level identified.  Coordination: The patient has no difficulty with RAM's or FNF bilaterally. Normal finger to nose  Motor: Strength is 5/5 in the bilateral upper and lower extremities. There is no pronator drift. There are no fasciculations noted. DTR's: Deep tendon reflexes are 2/4 at the bilateral biceps, triceps, brachioradialis, patella and achilles.  Plantar responses are downgoing bilaterally. Gait and Station: The patient is able to ambulate  with difficulty, ataxia is noted ( he states it is present for at least 12 years). Gait is cautious and unstable.  Unable to ambulate in tandem fashion, leans slightly to the right    Thank you for allowing [place] the opportunity to participate in the care of this nice patient. Please do not hesitate to contact us for any questions or concerns.   Total time spent on today's visit was  60 minutes, including both face-to-face time and nonface-to-face time.  Time included that spent on review of records (prior notes available to me/labs/imaging if  pertinent), discussing treatment and goals, answering patient's questions and coordinating care.  Cc:  Sharilyn Sites, MD  Sharene Butters 09/27/2020 12:32 PM

## 2020-09-27 ENCOUNTER — Telehealth: Payer: Self-pay

## 2020-09-27 ENCOUNTER — Other Ambulatory Visit (INDEPENDENT_AMBULATORY_CARE_PROVIDER_SITE_OTHER): Payer: PPO

## 2020-09-27 ENCOUNTER — Encounter: Payer: Self-pay | Admitting: Physician Assistant

## 2020-09-27 ENCOUNTER — Ambulatory Visit (INDEPENDENT_AMBULATORY_CARE_PROVIDER_SITE_OTHER): Payer: PPO | Admitting: Physician Assistant

## 2020-09-27 ENCOUNTER — Other Ambulatory Visit: Payer: Self-pay

## 2020-09-27 VITALS — BP 140/68 | HR 76 | Resp 20 | Ht 72.0 in | Wt 174.0 lb

## 2020-09-27 DIAGNOSIS — R413 Other amnesia: Secondary | ICD-10-CM | POA: Diagnosis not present

## 2020-09-27 LAB — VITAMIN B12: Vitamin B-12: 328 pg/mL (ref 211–911)

## 2020-09-27 LAB — TSH: TSH: 0.02 u[IU]/mL — ABNORMAL LOW (ref 0.35–5.50)

## 2020-09-27 MED ORDER — DONEPEZIL HCL 10 MG PO TABS: ORAL_TABLET | ORAL | 11 refills | Status: DC

## 2020-09-27 NOTE — Telephone Encounter (Signed)
-----   Message from Rondel Jumbo, PA-C sent at 09/27/2020  2:59 PM EDT ----- PLs inform the pt that his TSH is now way low, need to adjust his Synthroid by PCP. B12 is normal. Thanks

## 2020-09-27 NOTE — Telephone Encounter (Signed)
Patient is on synthroid 150mg  was adjusted one week ago, PCP is following patient, Juluis Rainier

## 2020-09-27 NOTE — Patient Instructions (Addendum)
It was a pleasure to see you today at our office.   Recommendations:  Meds: Follow up in 3 months Check labs today   We will start donepezil half tablet (5mg ) daily for 2  weeks.  If you are tolerating the medication, then after 2 weeks, we will increase the dose to a full tablet of 10 mg daily.  Side effects include nausea, vomiting, diarrhea, vivid dreams, and muscle cramps.  Please call the clinic if you experience any of these symptoms.   RECOMMENDATIONS FOR ALL PATIENTS WITH MEMORY PROBLEMS: 1. Continue to exercise (Recommend 30 minutes of walking everyday, or 3 hours every week) 2. Increase social interactions - continue going to [place] and enjoy social gatherings with friends and family 3. Eat healthy, avoid fried foods and eat more fruits and vegetables 4. Maintain adequate blood pressure, blood sugar, and blood cholesterol level. Reducing the risk of stroke and cardiovascular disease also helps promoting better memory. 5. Avoid stressful situations. Live a simple life and avoid aggravations. Organize your time and prepare for the next day in anticipation. 6. Sleep well, avoid any interruptions of sleep and avoid any distractions in the bedroom that may interfere with adequate sleep quality 7. Avoid sugar, avoid sweets as there is a strong link between excessive sugar intake, diabetes, and cognitive impairment We discussed the Mediterranean diet, which has been shown to help patients reduce the risk of progressive memory disorders and reduces cardiovascular risk. This includes eating fish, eat fruits and green leafy vegetables, nuts like almonds and hazelnuts, walnuts, and also use olive oil. Avoid fast foods and fried foods as much as possible. Avoid sweets and sugar as sugar use has been linked to worsening of memory function.  There is always a concern of gradual progression of memory problems. If this is the case, then we may need to adjust level of care according to patient needs.  Support, both to the patient and caregiver, should then be put into place.    The Alzheimer's Association is here all day, every day for people facing Alzheimer's disease through our free 24/7 Helpline: [phone]. The Helpline provides reliable information and support to all those who need assistance, such as individuals living with memory loss, Alzheimer's or other dementia, caregivers, health care professionals and the public.  Our highly trained and knowledgeable staff can help you with: Understanding memory loss, dementia and Alzheimer's  Medications and other treatment options  General information about aging and brain health  Skills to provide quality care and to find the best care from professionals  Legal, financial and living-arrangement decisions Our Helpline also features: Confidential care consultation provided by master's level clinicians who can help with decision-making support, crisis assistance and education on issues families face every day  Help in a caller's preferred language using our translation service that features more than 200 languages and dialects  Referrals to local community programs, services and ongoing support     FALL PRECAUTIONS: Be cautious when walking. Scan the area for obstacles that may increase the risk of trips and falls. When getting up in the mornings, sit up at the edge of the bed for a few minutes before getting out of bed. Consider elevating the bed at the head end to avoid drop of blood pressure when getting up. Walk always in a well-lit room (use night lights in the walls). Avoid area rugs or power cords from appliances in the middle of the walkways. Use a walker or a cane if necessary and  consider physical therapy for balance exercise. Get your eyesight checked regularly.  FINANCIAL OVERSIGHT: Supervision, especially oversight when making financial decisions or transactions is also recommended.  HOME SAFETY: Consider the safety of the  kitchen when operating appliances like stoves, microwave oven, and blender. Consider having supervision and share cooking responsibilities until no longer able to participate in those. Accidents with firearms and other hazards in the house should be identified and addressed as well.   ABILITY TO BE LEFT ALONE: If patient is unable to contact 911 operator, consider using LifeLine, or when the need is there, arrange for someone to stay with patients. Smoking is a fire hazard, consider supervision or cessation. Risk of wandering should be assessed by caregiver and if detected at any point, supervision and safe proof recommendations should be instituted.  MEDICATION SUPERVISION: Inability to self-administer medication needs to be constantly addressed. Implement a mechanism to ensure safe administration of the medications.   DRIVING: Regarding driving, in patients with progressive memory problems, driving will be impaired. We advise to have someone else do the driving if trouble finding directions or if minor accidents are reported. Independent driving assessment is available to determine safety of driving.   If you are interested in the driving assessment, you can contact the following:  [employer] in [place]  [place] [phone] or [phone]      [place] refers to food and lifestyle choices that are based on the traditions of countries located on the [employer]. This way of eating has been shown to help prevent certain conditions and improve outcomes for people who have chronic diseases, like kidney disease and heart disease. What are tips for following this plan? Lifestyle  Cook and eat meals together with your family, when possible. Drink enough fluid to keep your urine clear or pale yellow. Be physically active every day. This  includes: Aerobic exercise like running or swimming. Leisure activities like gardening, walking, or housework. Get 7-8 hours of sleep each night. If recommended by your health care provider, drink red wine in moderation. This means 1 glass a day for nonpregnant women and 2 glasses a day for men. A glass of wine equals 5 oz (150 mL). Reading food labels  Check the serving size of packaged foods. For foods such as rice and pasta, the serving size refers to the amount of cooked product, not dry. Check the total fat in packaged foods. Avoid foods that have saturated fat or trans fats. Check the ingredients list for added sugars, such as corn syrup. Shopping  At the grocery store, buy most of your food from the areas near the walls of the store. This includes: Fresh fruits and vegetables (produce). Grains, beans, nuts, and seeds. Some of these may be available in unpackaged forms or large amounts (in bulk). Fresh seafood. Poultry and eggs. Low-fat dairy products. Buy whole ingredients instead of prepackaged foods. Buy fresh fruits and vegetables in-season from local farmers markets. Buy frozen fruits and vegetables in resealable bags. If you do not have access to quality fresh seafood, buy precooked frozen shrimp or canned fish, such as tuna, salmon, or sardines. Buy small amounts of raw or cooked vegetables, salads, or olives from the deli or salad bar at your store. Stock your pantry so you always have certain foods on hand, such as olive oil, canned tuna, canned tomatoes, rice, pasta, and beans. Cooking  [employer]  with extra-virgin olive oil instead of using butter or other vegetable oils. Have meat as a side dish, and have vegetables or grains as your main dish. This means having meat in small portions or adding small amounts of meat to foods like pasta or stew. Use beans or vegetables instead of meat in common dishes like chili or lasagna. Experiment with different cooking methods. Try  roasting or broiling vegetables instead of steaming or sauteing them. Add frozen vegetables to soups, stews, pasta, or rice. Add nuts or seeds for added healthy fat at each meal. You can add these to yogurt, salads, or vegetable dishes. Marinate fish or vegetables using olive oil, lemon juice, garlic, and fresh herbs. Meal planning  Plan to eat 1 vegetarian meal one day each week. Try to work up to 2 vegetarian meals, if possible. Eat seafood 2 or more times a week. Have healthy snacks readily available, such as: Vegetable sticks with hummus. Greek yogurt. Fruit and nut trail mix. Eat balanced meals throughout the week. This includes: Fruit: 2-3 servings a day Vegetables: 4-5 servings a day Low-fat dairy: 2 servings a day Fish, poultry, or lean meat: 1 serving a day Beans and legumes: 2 or more servings a week Nuts and seeds: 1-2 servings a day Whole grains: 6-8 servings a day Extra-virgin olive oil: 3-4 servings a day Limit red meat and sweets to only a few servings a month What are my food choices? Mediterranean diet Recommended Grains: Whole-grain pasta. Brown rice. Bulgar wheat. Polenta. Couscous. Whole-wheat bread. [place]. Vegetables: Artichokes. Beets. Broccoli. Cabbage. Carrots. Eggplant. Green beans. Chard. Kale. Spinach. Onions. Leeks. Peas. Squash. Tomatoes. Peppers. Radishes. Fruits: Apples. Apricots. Avocado. Berries. Bananas. Cherries. Dates. Figs. Grapes. Lemons. Melon. Oranges. Peaches. Plums. Pomegranate. Meats and other protein foods: Beans. Almonds. Sunflower seeds. Pine nuts. Peanuts. [place]. Salmon. Scallops. Shrimp. [place]. Tilapia. Clams. Oysters. Eggs. Dairy: Low-fat milk. Cheese. Greek yogurt. Beverages: Water. Red wine. Herbal tea. Fats and oils: Extra virgin olive oil. Avocado oil. Grape seed oil. Sweets and desserts: [place] yogurt with honey. Baked apples. Poached pears. Trail mix. Seasoning and other foods: Basil. Cilantro. Coriander. Cumin. Mint.  Parsley. Sage. Rosemary. Tarragon. Garlic. Oregano. Thyme. Pepper. Balsalmic vinegar. Tahini. Hummus. Tomato sauce. Olives. Mushrooms. Limit these Grains: Prepackaged pasta or rice dishes. Prepackaged cereal with added sugar. Vegetables: Deep fried potatoes (french fries). Fruits: Fruit canned in syrup. Meats and other protein foods: Beef. Pork. Lamb. Poultry with skin. Hot dogs. [place] Salines. Dairy: Ice cream. Sour cream. Whole milk. Beverages: Juice. Sugar-sweetened soft drinks. Beer. Liquor and spirits. Fats and oils: Butter. Canola oil. Vegetable oil. Beef fat (tallow). Lard. Sweets and desserts: Cookies. Cakes. Pies. Candy. Seasoning and other foods: Mayonnaise. Premade sauces and marinades. The items listed may not be a complete list. Talk with your dietitian about what dietary choices are right for you. Summary The Mediterranean diet includes both food and lifestyle choices. Eat a variety of fresh fruits and vegetables, beans, nuts, seeds, and whole grains. Limit the amount of red meat and sweets that you eat. Talk with your health care provider about whether it is safe for you to drink red wine in moderation. This means 1 glass a day for nonpregnant women and 2 glasses a day for men. A glass of wine equals 5 oz (150 mL). This information is not intended to replace advice given to you by your health care provider. Make sure you discuss any questions you have with your health care provider. Document Released: 10/07/2015 Document Revised: 11/09/2015  Document Reviewed: 10/07/2015 Elsevier Interactive Patient Education  2017 [employer].

## 2020-09-28 DIAGNOSIS — U071 COVID-19: Secondary | ICD-10-CM | POA: Diagnosis not present

## 2020-09-28 DIAGNOSIS — Z20828 Contact with and (suspected) exposure to other viral communicable diseases: Secondary | ICD-10-CM | POA: Diagnosis not present

## 2020-09-29 ENCOUNTER — Other Ambulatory Visit: Payer: Self-pay

## 2020-09-29 ENCOUNTER — Encounter (HOSPITAL_COMMUNITY): Payer: Self-pay

## 2020-09-29 ENCOUNTER — Inpatient Hospital Stay (HOSPITAL_COMMUNITY): Payer: PPO | Attending: Hematology

## 2020-09-29 VITALS — BP 110/54 | HR 65 | Temp 96.8°F | Resp 17

## 2020-09-29 DIAGNOSIS — Z452 Encounter for adjustment and management of vascular access device: Secondary | ICD-10-CM | POA: Diagnosis not present

## 2020-09-29 DIAGNOSIS — C3491 Malignant neoplasm of unspecified part of right bronchus or lung: Secondary | ICD-10-CM

## 2020-09-29 DIAGNOSIS — Z95828 Presence of other vascular implants and grafts: Secondary | ICD-10-CM

## 2020-09-29 DIAGNOSIS — C3411 Malignant neoplasm of upper lobe, right bronchus or lung: Secondary | ICD-10-CM | POA: Diagnosis not present

## 2020-09-29 MED ORDER — HEPARIN SOD (PORK) LOCK FLUSH 100 UNIT/ML IV SOLN
500.0000 [IU] | Freq: Once | INTRAVENOUS | Status: AC
  Administered 2020-09-29: 500 [IU] via INTRAVENOUS

## 2020-09-29 MED ORDER — HEPARIN SOD (PORK) LOCK FLUSH 100 UNIT/ML IV SOLN: 500.0000 [IU] | Freq: Once | INTRAVENOUS | Status: DC

## 2020-09-29 MED ORDER — SODIUM CHLORIDE 0.9% FLUSH
10.0000 mL | Freq: Once | INTRAVENOUS | Status: AC
  Administered 2020-09-29: 10 mL

## 2020-09-29 MED ORDER — SODIUM CHLORIDE 0.9% FLUSH: 10.0000 mL | Freq: Once | INTRAVENOUS | Status: DC

## 2020-09-29 NOTE — Patient Instructions (Signed)
[facility]  Discharge Instructions: Thank you for choosing [facility] to provide your oncology and hematology care.  If you have a lab appointment with the Cancer Center, please come in thru the Main Entrance and check in at the main information desk.  Wear comfortable clothing and clothing appropriate for easy access to any Portacath or PICC line.   We strive to give you quality time with your provider. You may need to reschedule your appointment if you arrive late (15 or more minutes).  Arriving late affects you and other patients whose appointments are after yours.  Also, if you miss three or more appointments without notifying the office, you may be dismissed from the clinic at the provider's discretion.      For prescription refill requests, have your pharmacy contact our office and allow 72 hours for refills to be completed.        To help prevent nausea and vomiting after your treatment, we encourage you to take your nausea medication as directed.  BELOW ARE SYMPTOMS THAT SHOULD BE REPORTED IMMEDIATELY: *FEVER GREATER THAN 100.4 F (38 C) OR HIGHER *CHILLS OR SWEATING *NAUSEA AND VOMITING THAT IS NOT CONTROLLED WITH YOUR NAUSEA MEDICATION *UNUSUAL SHORTNESS OF BREATH *UNUSUAL BRUISING OR BLEEDING *URINARY PROBLEMS (pain or burning when urinating, or frequent urination) *BOWEL PROBLEMS (unusual diarrhea, constipation, pain near the anus) TENDERNESS IN MOUTH AND THROAT WITH OR WITHOUT PRESENCE OF ULCERS (sore throat, sores in mouth, or a toothache) UNUSUAL RASH, SWELLING OR PAIN  UNUSUAL VAGINAL DISCHARGE OR ITCHING   Items with * indicate a potential emergency and should be followed up as soon as possible or go to the Emergency Department if any problems should occur.  Please show the CHEMOTHERAPY ALERT CARD or IMMUNOTHERAPY ALERT CARD at check-in to the Emergency Department and triage nurse.  Should you have questions after your visit or need to cancel  or reschedule your appointment, please contact [facility] [phone]  and follow the prompts.  Office hours are 8:00 a.m. to 4:30 p.m. Monday - Friday. Please note that voicemails left after 4:00 p.m. may not be returned until the following business day.  We are closed weekends and major holidays. You have access to a nurse at all times for urgent questions. Please call the main number to the clinic [phone] and follow the prompts.  For any non-urgent questions, you may also contact your provider using MyChart. We now offer e-Visits for anyone 18 and older to request care online for non-urgent symptoms. For details visit [email or web address].   Also download the MyChart app! Go to the app store, search "MyChart", open the app, select [facility], and log in with your MyChart username and password.  Due to Covid, a mask is required upon entering the hospital/clinic. If you do not have a mask, one will be given to you upon arrival. For doctor visits, patients may have 1 support person aged 18 or older with them. For treatment visits, patients cannot have anyone with them due to current Covid guidelines and our immunocompromised population.  

## 2020-09-29 NOTE — Progress Notes (Signed)
Patients port flushed without difficulty.  Good blood return noted with no bruising or swelling noted at site.  Band aid applied.  VSS with discharge and left in satisfactory condition with no s/s of distress noted.   

## 2020-10-05 ENCOUNTER — Ambulatory Visit (INDEPENDENT_AMBULATORY_CARE_PROVIDER_SITE_OTHER): Payer: PPO | Admitting: Gastroenterology

## 2020-10-05 ENCOUNTER — Encounter: Payer: Self-pay | Admitting: *Deleted

## 2020-10-05 ENCOUNTER — Other Ambulatory Visit: Payer: Self-pay

## 2020-10-05 ENCOUNTER — Encounter: Payer: Self-pay | Admitting: Gastroenterology

## 2020-10-05 VITALS — BP 127/56 | HR 64 | Temp 97.7°F | Ht 72.0 in | Wt 150.8 lb

## 2020-10-05 DIAGNOSIS — K7469 Other cirrhosis of liver: Secondary | ICD-10-CM

## 2020-10-05 DIAGNOSIS — U071 COVID-19: Secondary | ICD-10-CM | POA: Diagnosis not present

## 2020-10-05 DIAGNOSIS — Z20828 Contact with and (suspected) exposure to other viral communicable diseases: Secondary | ICD-10-CM | POA: Diagnosis not present

## 2020-10-05 NOTE — Patient Instructions (Addendum)
We are arranging an ultrasound in the near future!  We will see you back in 6 months!  Please call with any concerns.   I enjoyed seeing you again today! As you know, I value our relationship and want to provide genuine, compassionate, and quality care. I welcome your feedback. If you receive a survey regarding your visit,  I greatly appreciate you taking time to fill this out. See you next time!  Annitta Needs, PhD, ANP-BC [facility]

## 2020-10-05 NOTE — Progress Notes (Signed)
Referring Provider: Sharilyn Sites, MD Primary Care Physician:  Sharilyn Sites, MD Primary GI: Dr. Gala Romney    Chief Complaint  Patient presents with   Cirrhosis    HPI:   Ian Aguilar is a 80 y.o. 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 He has had multiple CTs but only one MRI in past. On CT, the cystic lesion has remained stable for approximately 7 years. Surveillance MRI Completed Feb 2022 and due again in 2 years. Completed Hepatitis vaccinations. Next EGD for surveillance due in 2023  2.5 liters continuously. Can use walker to ambulate. Working with PT. No abdominal pain. No N/V. No mental status changes. No overt GI bleeding. No confusion. No jaundice or pruritis.    Past Medical History:  Diagnosis Date   Anxiety    Arthritis    Cancer ([place])    Cirrhosis ([place])    confirmed by MRI on 07/04/11.     COPD (chronic obstructive pulmonary disease) ([place])    Depression with anxiety    ETOH abuse    Hyperlipidemia    Hypertension    diet control   Nodule of upper lobe of right lung    with mediastinal adenopathy   Wears dentures    Wears glasses     Past Surgical History:  Procedure Laterality Date   bilateral cataract surgery     [place]   broken arm  6 yrs ago   left otif of wrist-[facility]   CLOSED REDUCTION WRIST FRACTURE Right 09/02/2015   Procedure: RIGHT WRIST REDUCTION;  Surgeon: Renette Butters, MD;  Location: [facility];  Service: Orthopedics;  Laterality: Right;  with MAC   COLONOSCOPY  1996   [facility]: external hemorrhoids, no polyps   COLONOSCOPY  06/28/2011   Dr. Ala Bent diverticulosis,tubular adenoma, hyperplastic polyp   COLONOSCOPY WITH PROPOFOL N/A 04/26/2017   Dr. Gala Romney: perianal and digital rectal examinations normal, diffusely congested colonic mucosa but otherwise normal    ESOPHAGOGASTRODUODENOSCOPY  06/28/2011   Dr. Chelsea Aus erosive reflux esophagitis, hiatal hernia-gastritis   ESOPHAGOGASTRODUODENOSCOPY (EGD) WITH PROPOFOL N/A 04/26/2017   Dr. Gala Romney: normal esophagus, small hiatal hernia, GAVE, portal hypertensive gastropathy, normal duodenal bulb and second portion, no specimens collected. 2 years screening    ESOPHAGOGASTRODUODENOSCOPY (EGD) WITH PROPOFOL N/A 06/19/2019   normal esophagus, portal gastropathy, GAVE, normal duodenum. 2 years screening.    EXTERNAL EAR SURGERY     right ear-cleaned out ear and created new eardrum   INGUINAL HERNIA REPAIR  2-3 yrs ago   left-[facility]   INTRAMEDULLARY (IM) NAIL INTERTROCHANTERIC Right 09/02/2015   Procedure: RIGHT  INTERTROCHANTRIC HIP;  Surgeon: Renette Butters, MD;  Location: [facility];  Service: Orthopedics;  Laterality: Right;  With MAC   KNEE SURGERY     left-arthroscopy-[place]   PORTACATH PLACEMENT Left 06/08/2017   Procedure: INSERTION POWER PORT WITH  ATTACHED CATHETER LEFT SUBCLAVIAN;  Surgeon: Aviva Signs, MD;  Location: [facility];  Service: General;  Laterality: Left;   SKULL FRACTURE ELEVATION     fractured cheeck bone repaired   TOTAL HIP ARTHROPLASTY  12/18/2011   Procedure: TOTAL HIP ARTHROPLASTY;  Surgeon: Carole Civil, MD;  Location: [facility];  Service: Orthopedics;  Laterality: Left;   VIDEO BRONCHOSCOPY WITH ENDOBRONCHIAL ULTRASOUND N/A 05/04/2017   Procedure: VIDEO BRONCHOSCOPY WITH ENDOBRONCHIAL ULTRASOUND;  Surgeon: Melrose Nakayama, MD;  [facility];  Service: Thoracic;  Laterality: N/A;   WISDOM TOOTH EXTRACTION      Current Outpatient Medications  Medication Sig Dispense Refill   cholecalciferol (VITAMIN D) 1000 units tablet Take 1,000 Units by mouth daily.     docusate sodium (COLACE) 100 MG capsule Take 1 capsule (100 mg total) by mouth 2 (two) times daily. 60 capsule 2   donepezil (ARICEPT) 10 MG tablet Take half tablet (5 mg) daily for 2 weeks, then increase to the full  tablet at 10 mg daily 30 tablet 11   feeding supplement (ENSURE ENLIVE / ENSURE PLUS) LIQD Take 237 mLs by mouth 2 (two) times daily between meals.     GOODSENSE ARTIFICIAL TEARS 0.5-0.6 % SOLN Apply to eye.     guaiFENesin (MUCINEX) 600 MG 12 hr tablet Take 600 mg by mouth 2 (two) times daily as needed for cough (congestion).     levothyroxine (SYNTHROID) 150 MCG tablet Take 150 mcg by mouth daily.     NON FORMULARY Diet NAS     OXYGEN Inhale 2 L into the lungs continuous.     polyethylene glycol (MIRALAX / GLYCOLAX) 17 g packet Take 17 g by mouth daily. 14 each 0   rosuvastatin (CRESTOR) 10 MG tablet Take 1 tablet (10 mg total) by mouth daily. 30 tablet 0   SPIRIVA RESPIMAT 2.5 MCG/ACT AERS Inhale 2 puffs into the lungs daily.     triamcinolone (KENALOG) 0.025 % ointment Apply 1 application topically 2 (two) times daily. Apply to face and arms sparingly for dryness     No current facility-administered medications for this visit.    Allergies as of 10/05/2020   (No Known Allergies)    Family History  Problem Relation Age of Onset   Stroke Mother 12   Heart disease Father 62       MI   Diabetes Maternal Uncle    Colon cancer Neg Hx    Cancer Neg Hx     Social History   Socioeconomic History   Marital status: Widowed    Spouse name: Not on file   Number of children: 2   Years of education: 73   Highest education level: Not on file  Occupational History   Occupation: retired    Fish farm manager: [employer]  Tobacco Use   Smoking status: Former    Packs/day: 0.00    Years: 62.00    Pack years: 0.00    Types: Cigarettes    Quit date: 02/28/2019    Years since quitting: 1.6   Smokeless tobacco: Never   Tobacco comments:    1/2 ppd > 60 years as of 06/27/17: 4-5 cigarettes or less a day  Vaping Use   Vaping Use: Never used  Substance and Sexual Activity   Alcohol use: Not Currently    Alcohol/week: 2.0 standard drinks    Types: 2 Cans of beer per week   Drug use:  No   Sexual activity: Yes    Birth control/protection: None  Other Topics Concern   Not on file  Social History Narrative   Right handed   Drinks caffeine   Assistant living   Social Determinants of Health   Financial Resource Strain: Not on file  Food Insecurity: Not on file  Transportation Needs: Not on file  Physical Activity: Not on file  Stress: Not on file  Social Connections: Not on file    Review of Systems: Gen: Denies fever, chills, anorexia. Denies fatigue,  weakness, weight loss.  CV: Denies chest pain, palpitations, syncope, peripheral edema, and claudication. Resp: Denies dyspnea at rest, cough, wheezing, coughing up blood, and pleurisy. GI: see HPI Derm: Denies rash, itching, dry skin Psych: Denies depression, anxiety, memory loss, confusion. No homicidal or suicidal ideation.  Heme: Denies bruising, bleeding, and enlarged lymph nodes.  Physical Exam: BP (!) 127/56   Pulse 64   Temp 97.7 F (36.5 C) (Temporal)   Ht 6' (1.829 m)   Wt 150 lb 12.8 oz (68.4 kg)   BMI 20.45 kg/m  General:   Alert and oriented. No distress noted. Pleasant and cooperative. Sitting in wheelchair. Nasal cannula O2.  Head:  Normocephalic and atraumatic. Eyes:  Conjuctiva clear without scleral icterus. Mouth:  mask in place Abdomen:  +BS, soft, non-tender and non-distended. No rebound or guarding. No HSM or masses noted. Msk:  Symmetrical without gross deformities. Normal posture. Extremities:  Without edema. Neurologic:  Alert and  oriented x4 Psych:  Alert and cooperative. Normal mood and affect.  Lab Results  Component Value Date   WBC 5.1 07/19/2020   HGB 14.1 07/19/2020   HCT 45.5 07/19/2020   MCV 101.1 (H) 07/19/2020   PLT 229 07/19/2020   Lab Results  Component Value Date   ALT 16 07/19/2020   AST 22 07/19/2020   ALKPHOS 90 07/19/2020   BILITOT 0.6 07/19/2020   Lab Results  Component Value Date   CREATININE 0.80 07/19/2020   BUN 16 07/19/2020   NA 136  07/19/2020   K 4.5 07/19/2020   CL 91 (L) 07/19/2020   CO2 37 (H) 07/19/2020   Lab Results  Component Value Date   INR 1.1 06/22/2020   INR 1.1 04/22/2020   INR 1.1 04/20/2020     ASSESSMENT/PLAN: NAM VOSSLER is a 80 y.o. male presenting today  with a history of cirrhosis presumably related to ETOH abuse. 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 He has had multiple CTs but only one MRI in past. On CT, the cystic lesion has remained stable for approximately 7 years. Surveillance MRI Completed Feb 2022 and due again in 2 years. Completed Hepatitis vaccinations. Next EGD for surveillance due in 2023  Remains fairly well-compensated from a cirrhotic standpoint. No alarm signs/symptoms today. We will be arranging an ultrasound in the near future and have him return in 6 months. Labs recently on file from May 2022, so we can repeat this again at next visit.    EGD in 2023. Will arrange at next visit.   Annitta Needs, PhD, ANP-BC [facility]

## 2020-10-06 ENCOUNTER — Encounter: Payer: Self-pay | Admitting: Orthopedic Surgery

## 2020-10-06 ENCOUNTER — Ambulatory Visit (INDEPENDENT_AMBULATORY_CARE_PROVIDER_SITE_OTHER): Payer: PPO | Admitting: Orthopedic Surgery

## 2020-10-06 VITALS — BP 122/57 | HR 69 | Ht 72.0 in | Wt 154.0 lb

## 2020-10-06 DIAGNOSIS — J449 Chronic obstructive pulmonary disease, unspecified: Secondary | ICD-10-CM | POA: Diagnosis not present

## 2020-10-06 DIAGNOSIS — M5410 Radiculopathy, site unspecified: Secondary | ICD-10-CM

## 2020-10-06 MED ORDER — GABAPENTIN 100 MG PO CAPS: 100.0000 mg | ORAL_CAPSULE | Freq: Three times a day (TID) | ORAL | 0 refills | Status: DC

## 2020-10-06 MED ORDER — PREDNISONE 10 MG PO TABS: 10.0000 mg | ORAL_TABLET | Freq: Two times a day (BID) | ORAL | 0 refills | Status: AC

## 2020-10-06 NOTE — Progress Notes (Signed)
New patient and new problem   Chief Complaint  Patient presents with   Hip Pain    Right hip with ambulation/has had sx on right hip in 8139    80 year old male unexplained onset right hip pain status post IM nail right hip many years ago and status post left total hip many years ago  Pain is over the right greater trochanter but on exam he actually has back pain and tenderness on the right side and tracks through the soft tissues down to the trochanter and he does not have any real trochanteric pain.     Body mass index is 20.89 kg/m.  BP (!) 122/57   Pulse 69   Ht 6' (1.829 m)   Wt 154 lb (69.9 kg)   BMI 20.89 kg/m   Past Medical History:  Diagnosis Date   Anxiety    Arthritis    Cancer ([place])    Cirrhosis ([place])    confirmed by MRI on 07/04/11.     COPD (chronic obstructive pulmonary disease) ([facility])    Depression with anxiety    ETOH abuse    Hyperlipidemia    Hypertension    diet control   Nodule of upper lobe of right lung    with mediastinal adenopathy   Wears dentures    Wears glasses     Physical Exam  Constitutional: Development normal, nutrition normal, body habitus Body mass index is 20.89 kg/m.  Mental status oriented x3 mood and affect no depression Cardiovascular pulses and temperature normal   Gait he does ambulate but did not for me today  Musculoskeletal:  Inspection: Mild tenderness over the trochanter more proximal tenderness soft tissues and lower back Range of motion: Normal range of motion both hips Stability: Both hips stable and no pain with range of motion Muscle strength and tone: Normal in both lower extremities  Skin warm dry and intact no erythema  Neurological sensation normal  Assessment and plan:  Imaging of his right hip shows a well fixed nail well-healed fracture mild arthritis in the hip joint  His left total hip looks normal.  There is an x-ray report says that the implant is rotated but it is not it is intentionally  placed in more close position per modern total hip cup placement techniques  Recommended the patient take gabapentin and prednisone and follow-up as needed for his radicular pain  Encounter Diagnosis  Name Primary?   Radicular pain of right lower extremity Yes    Meds ordered this encounter  Medications   gabapentin (NEURONTIN) 100 MG capsule    Sig: Take 1 capsule (100 mg total) by mouth 3 (three) times daily for 14 days.    Dispense:  42 capsule    Refill:  0   predniSONE (DELTASONE) 10 MG tablet    Sig: Take 1 tablet (10 mg total) by mouth 2 (two) times daily with a meal for 14 days.    Dispense:  28 tablet    Refill:  0   Precautions regarding medications drowsiness swelling given  Acute uncomplicated problem, independent interpretation of x-ray, prescription drug management with precautions

## 2020-10-12 ENCOUNTER — Other Ambulatory Visit: Payer: Self-pay

## 2020-10-12 ENCOUNTER — Ambulatory Visit (HOSPITAL_COMMUNITY)
Admission: RE | Admit: 2020-10-12 | Discharge: 2020-10-12 | Disposition: A | Discharge status: Home / Self Care | Payer: PPO | Source: Ambulatory Visit | Attending: Gastroenterology | Admitting: Gastroenterology

## 2020-10-12 DIAGNOSIS — K7469 Other cirrhosis of liver: Secondary | ICD-10-CM | POA: Diagnosis not present

## 2020-10-12 DIAGNOSIS — K7460 Unspecified cirrhosis of liver: Secondary | ICD-10-CM | POA: Diagnosis not present

## 2020-10-20 DIAGNOSIS — R531 Weakness: Secondary | ICD-10-CM | POA: Diagnosis not present

## 2020-10-20 DIAGNOSIS — C3490 Malignant neoplasm of unspecified part of unspecified bronchus or lung: Secondary | ICD-10-CM | POA: Diagnosis not present

## 2020-10-20 DIAGNOSIS — Z20828 Contact with and (suspected) exposure to other viral communicable diseases: Secondary | ICD-10-CM | POA: Diagnosis not present

## 2020-10-20 DIAGNOSIS — J449 Chronic obstructive pulmonary disease, unspecified: Secondary | ICD-10-CM | POA: Diagnosis not present

## 2020-10-20 DIAGNOSIS — J439 Emphysema, unspecified: Secondary | ICD-10-CM | POA: Diagnosis not present

## 2020-10-20 DIAGNOSIS — U071 COVID-19: Secondary | ICD-10-CM | POA: Diagnosis not present

## 2020-10-27 ENCOUNTER — Emergency Department (HOSPITAL_COMMUNITY)
Admission: EM | Admit: 2020-10-27 | Discharge: 2020-10-27 | Disposition: A | Discharge status: Home / Self Care | Payer: PPO | Attending: Emergency Medicine | Admitting: Emergency Medicine

## 2020-10-27 ENCOUNTER — Other Ambulatory Visit: Payer: Self-pay

## 2020-10-27 DIAGNOSIS — Z85118 Personal history of other malignant neoplasm of bronchus and lung: Secondary | ICD-10-CM | POA: Insufficient documentation

## 2020-10-27 DIAGNOSIS — Z96642 Presence of left artificial hip joint: Secondary | ICD-10-CM | POA: Diagnosis not present

## 2020-10-27 DIAGNOSIS — Z87891 Personal history of nicotine dependence: Secondary | ICD-10-CM | POA: Diagnosis not present

## 2020-10-27 DIAGNOSIS — K625 Hemorrhage of anus and rectum: Secondary | ICD-10-CM | POA: Diagnosis not present

## 2020-10-27 DIAGNOSIS — R58 Hemorrhage, not elsewhere classified: Secondary | ICD-10-CM | POA: Diagnosis not present

## 2020-10-27 DIAGNOSIS — J449 Chronic obstructive pulmonary disease, unspecified: Secondary | ICD-10-CM | POA: Diagnosis not present

## 2020-10-27 DIAGNOSIS — E039 Hypothyroidism, unspecified: Secondary | ICD-10-CM | POA: Diagnosis not present

## 2020-10-27 DIAGNOSIS — Z79899 Other long term (current) drug therapy: Secondary | ICD-10-CM | POA: Insufficient documentation

## 2020-10-27 DIAGNOSIS — I10 Essential (primary) hypertension: Secondary | ICD-10-CM | POA: Diagnosis not present

## 2020-10-27 DIAGNOSIS — F0390 Unspecified dementia without behavioral disturbance: Secondary | ICD-10-CM | POA: Diagnosis not present

## 2020-10-27 LAB — CBC
HCT: 40.8 % (ref 39.0–52.0)
Hemoglobin: 12.9 g/dL — ABNORMAL LOW (ref 13.0–17.0)
MCH: 30.8 pg (ref 26.0–34.0)
MCHC: 31.6 g/dL (ref 30.0–36.0)
MCV: 97.4 fL (ref 80.0–100.0)
Platelets: 171 10*3/uL (ref 150–400)
RBC: 4.19 MIL/uL — ABNORMAL LOW (ref 4.22–5.81)
RDW: 15 % (ref 11.5–15.5)
WBC: 4.9 10*3/uL (ref 4.0–10.5)
nRBC: 0 % (ref 0.0–0.2)

## 2020-10-27 LAB — BASIC METABOLIC PANEL
Anion gap: 6 (ref 5–15)
BUN: 25 mg/dL — ABNORMAL HIGH (ref 8–23)
CO2: 38 mmol/L — ABNORMAL HIGH (ref 22–32)
Calcium: 9 mg/dL (ref 8.9–10.3)
Chloride: 98 mmol/L (ref 98–111)
Creatinine, Ser: 0.89 mg/dL (ref 0.61–1.24)
GFR, Estimated: >60 mL/min (ref >60)
Glucose, Bld: 91 mg/dL (ref 70–99)
Potassium: 4.2 mmol/L (ref 3.5–5.1)
Sodium: 142 mmol/L (ref 135–145)

## 2020-10-27 NOTE — Discharge Instructions (Addendum)
Rectal bleeding can occur in many circumstances, and today's evaluation has been generally reassuring.  There is suspicion that your rectal bleeding is due to diverticulosis.  Your tests today are-otherwise reassuring.  Please follow-up with your gastroenterologist.  You may experience additional episodes of bleeding following defecation, but if you begin experiencing bleeding spontaneously, do not hesitate to return here for additional evaluation.

## 2020-10-27 NOTE — ED Triage Notes (Signed)
Pt brought in by [facility] from [facility] for c/o rectal bleeding. Pt alert and oriented, denies pain at this time.

## 2020-10-27 NOTE — ED Provider Notes (Signed)
[facility] EMERGENCY DEPARTMENT Provider Note   CSN: [id number] Arrival date & time: 10/27/20  1335     History Chief Complaint  Patient presents with   Rectal Bleeding    Ian Aguilar is a 80 y.o. male.  HPI Patient presents from nursing facility due to staff noticing blood in the commode following the patient having a bowel movement. He has listed history of dementia, but seems to provide details of his recent situation well, as well as describing today's event. He denies pain, lightheadedness, nausea, vomiting, syncope, or any knowledge that he has had blood in his stool recently.  He does not take blood thinning medication. Per nursing report the patient's staff at his nursing facility noticed amount of blood clot following a bowel movement just prior to ED transfer.    Past Medical History:  Diagnosis Date   Anxiety    Arthritis    Cancer ([place])    Cirrhosis ([place])    confirmed by MRI on 07/04/11.     COPD (chronic obstructive pulmonary disease) ([facility])    Depression with anxiety    ETOH abuse    Hyperlipidemia    Hypertension    diet control   Nodule of upper lobe of right lung    with mediastinal adenopathy   Wears dentures    Wears glasses     Patient Active Problem List   Diagnosis Date Noted   Neurocognitive deficits 04/29/2020   Hyperlipidemia LDL goal <100 04/27/2020   Metacarpal bone fracture 04/27/2020   Aortic atherosclerosis ([place]) 04/27/2020   Chronic constipation 04/27/2020   Severe hypothyroidism 04/22/2020   Distention of biliary tract 04/21/2020   AKI (acute kidney injury) ([place]) 04/21/2020   Hypothyroidism 04/21/2020   Abdominal distension 04/20/2020   Acute encephalopathy 10/23/2019   Dehydration 10/23/2019   COPD with acute exacerbation ([place]) 10/23/2019   Chronic hypoxemic respiratory failure ([place]) 10/23/2019   Hypertensive urgency 10/23/2019   Goals of care, counseling/discussion    Palliative care by specialist    DNR (do not  resuscitate) discussion    Malignant neoplasm of bronchus and lung ([place])    Carcinoma, lung ([place]) 06/05/2017   Malignant neoplasm of upper lobe of right lung ([place]) 05/15/2017   Hepatic cirrhosis ([place]) 04/06/2017   History of colonic polyps 04/06/2017   History of snoring 09/07/2015   Fracture of ulnar styloid    Benign essential HTN    Faintness    Thrombocytopenia ([place]) 09/03/2015   Hyponatremia 09/03/2015   Closed displaced intertrochanteric fracture of right femur ([place])    Distal radius fracture, right 09/02/2015   Fracture of right hip requiring operative repair ([place]) 09/01/2015   Alcohol abuse 09/01/2015   Tobacco abuse 09/01/2015   COPD (chronic obstructive pulmonary disease) 09/01/2015   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   Weakness of left leg 01/10/2012   Difficulty in walking(719.7) 01/10/2012   Pain in left hip 01/10/2012   Acute blood loss anemia 12/21/2011   Cirrhosis- imaging diagnosis 2013 63/89/3734   Umbilical hernia 28/76/8115   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   Hepatomegaly 06/14/2011   Encounter for screening colonoscopy 06/14/2011   JOINT EFFUSION, KNEE 02/10/2008   KNEE PAIN 02/10/2008    Past Surgical History:  Procedure Laterality Date   bilateral cataract surgery     [place]   broken arm  6 yrs ago   left otif of wrist-[facility]   CLOSED REDUCTION WRIST FRACTURE Right 09/02/2015   Procedure: RIGHT WRIST REDUCTION;  Surgeon: Ernesta Amble  Percell Miller, MD;  Location: [facility];  Service: Orthopedics;  Laterality: Right;  with MAC   COLONOSCOPY  1996   [facility]: external hemorrhoids, no polyps   COLONOSCOPY  06/28/2011   Dr. Ala Bent diverticulosis,tubular adenoma, hyperplastic polyp   COLONOSCOPY WITH PROPOFOL N/A 04/26/2017   Dr. Gala Romney: perianal and digital rectal examinations normal, diffusely congested colonic mucosa but otherwise normal   ESOPHAGOGASTRODUODENOSCOPY  06/28/2011   Dr. Chelsea Aus erosive reflux esophagitis, hiatal hernia-gastritis    ESOPHAGOGASTRODUODENOSCOPY (EGD) WITH PROPOFOL N/A 04/26/2017   Dr. Gala Romney: normal esophagus, small hiatal hernia, GAVE, portal hypertensive gastropathy, normal duodenal bulb and second portion, no specimens collected. 2 years screening    ESOPHAGOGASTRODUODENOSCOPY (EGD) WITH PROPOFOL N/A 06/19/2019   normal esophagus, portal gastropathy, GAVE, normal duodenum. 2 years screening.    EXTERNAL EAR SURGERY     right ear-cleaned out ear and created new eardrum   INGUINAL HERNIA REPAIR  2-3 yrs ago   left-[facility]   INTRAMEDULLARY (IM) NAIL INTERTROCHANTERIC Right 09/02/2015   Procedure: RIGHT  INTERTROCHANTRIC HIP;  Surgeon: Renette Butters, MD;  Location: [facility];  Service: Orthopedics;  Laterality: Right;  With MAC   KNEE SURGERY     left-arthroscopy-[place]   PORTACATH PLACEMENT Left 06/08/2017   Procedure: INSERTION POWER PORT WITH  ATTACHED CATHETER LEFT SUBCLAVIAN;  Surgeon: Aviva Signs, MD;  [facility];  Service: General;  Laterality: Left;   SKULL FRACTURE ELEVATION     fractured cheeck bone repaired   TOTAL HIP ARTHROPLASTY  12/18/2011   Procedure: TOTAL HIP ARTHROPLASTY;  Surgeon: Carole Civil, MD;  [facility];  Service: Orthopedics;  Laterality: Left;   VIDEO BRONCHOSCOPY WITH ENDOBRONCHIAL ULTRASOUND N/A 05/04/2017   Procedure: VIDEO BRONCHOSCOPY WITH ENDOBRONCHIAL ULTRASOUND;  Surgeon: Melrose Nakayama, MD;  [facility];  Service: Thoracic;  Laterality: N/A;   WISDOM TOOTH EXTRACTION         Family History  Problem Relation Age of Onset   Stroke Mother 68   Heart disease Father 51       MI   Diabetes Maternal Uncle    Colon cancer Neg Hx    Cancer Neg Hx     Social History   Tobacco Use   Smoking status: Former    Packs/day: 0.00    Years: 62.00    Pack years: 0.00    Types: Cigarettes    Quit date: 02/28/2019    Years since quitting: 1.6   Smokeless tobacco: Never   Tobacco comments:    1/2 ppd > 60 years as of 06/27/17: 4-5 cigarettes or  less a day  Vaping Use   Vaping Use: Never used  Substance Use Topics   Alcohol use: Not Currently    Alcohol/week: 2.0 standard drinks    Types: 2 Cans of beer per week   Drug use: No    Home Medications Prior to Admission medications   Medication Sig Start Date End Date Taking? Authorizing Provider  cholecalciferol (VITAMIN D) 1000 units tablet Take 1,000 Units by mouth daily.    [provider]  docusate sodium (COLACE) 100 MG capsule Take 1 capsule (100 mg total) by mouth 2 (two) times daily. 04/23/20 04/23/21  Lavina Hamman, MD  donepezil (ARICEPT) 10 MG tablet Take half tablet (5 mg) daily for 2 weeks, then increase to the full tablet at 10 mg daily 09/27/20   Rondel Jumbo, PA-C  feeding supplement (ENSURE ENLIVE / ENSURE PLUS) LIQD Take 237 mLs by mouth 2 (  two) times daily between meals. 04/27/20   [provider]  gabapentin (NEURONTIN) 100 MG capsule Take 1 capsule (100 mg total) by mouth 3 (three) times daily for 14 days. 10/06/20 10/20/20  Carole Civil, MD  GOODSENSE ARTIFICIAL TEARS 0.5-0.6 % SOLN Apply to eye. 05/24/20   [provider]  guaiFENesin (MUCINEX) 600 MG 12 hr tablet Take 600 mg by mouth 2 (two) times daily as needed for cough (congestion).    [provider]  levothyroxine (SYNTHROID) 150 MCG tablet Take 150 mcg by mouth daily. 09/20/20   [provider]  NON FORMULARY Diet NAS 04/23/20   [provider]  OXYGEN Inhale 2 L into the lungs continuous. 04/24/20   [provider]  polyethylene glycol (MIRALAX / GLYCOLAX) 17 g packet Take 17 g by mouth daily. 04/23/20   Lavina Hamman, MD  rosuvastatin (CRESTOR) 10 MG tablet Take 1 tablet (10 mg total) by mouth daily. 05/21/20   Gerlene Fee, NP  SPIRIVA RESPIMAT 2.5 MCG/ACT AERS Inhale 2 puffs into the lungs daily. 05/31/20   [provider]  triamcinolone (KENALOG) 0.025 % ointment Apply 1 application topically 2 (two) times daily. Apply to face  and arms sparingly for dryness 05/24/20   [provider]    Allergies    Patient has no known allergies.  Review of Systems   Review of Systems  Constitutional:        Per HPI, otherwise negative  HENT:         Per HPI, otherwise negative  Respiratory:         Per HPI, otherwise negative  Cardiovascular:        Per HPI, otherwise negative  Gastrointestinal:  Positive for blood in stool. Negative for vomiting.  Endocrine:       Negative aside from HPI  Genitourinary:        Neg aside from HPI   Musculoskeletal:        Per HPI, otherwise negative  Skin: Negative.   Neurological:  Negative for syncope.   Physical Exam Updated Vital Signs BP (!) 143/64 (BP Location: Right Arm)   Pulse (!) 51   Temp 97.7 F (36.5 C)   Resp (!) 21   Ht _0  (1.803 m)   Wt 66.7 kg   SpO2 100%   BMI 20.50 kg/m   Physical Exam Vitals and nursing note reviewed.  Constitutional:      General: He is not in acute distress.    Appearance: He is well-developed.  HENT:     Head: Normocephalic and atraumatic.  Eyes:     Conjunctiva/sclera: Conjunctivae normal.  Cardiovascular:     Rate and Rhythm: Normal rate and regular rhythm.  Pulmonary:     Effort: Pulmonary effort is normal. No respiratory distress.     Breath sounds: No stridor.  Abdominal:     General: There is no distension.     Tenderness: There is no abdominal tenderness. There is no guarding.  Skin:    General: Skin is warm and dry.  Neurological:     Mental Status: He is alert and oriented to person, place, and time.     Comments: Poor hearing otherwise cranial nerves unremarkable.  Age-appropriate atrophy, otherwise neurologic exam unremarkable.  Psychiatric:     Comments: Slightly slowed in responses, but the patient is oriented to person, place, roughly to time.  He is appropriately interactive, pleasant.    ED Results / Procedures / Treatments  Labs (all labs ordered are listed, but only abnormal results  are displayed) Labs Reviewed  BASIC METABOLIC PANEL  CBC    EKG None  Radiology No results found.  Procedures Procedures   Medications Ordered in ED Medications - No data to display  ED Course  I have reviewed the triage vital signs and the nursing notes.  Pertinent labs & imaging results that were available during my care of the patient were reviewed by me and considered in my medical decision making (see chart for details).  With consideration of bleed patient placed on continuous cardiac and pulse oximetry monitoring. Cardiac 50s/60s sinus unremarkable Pulse oximetry 99% room air normal 3:31 PM Labs reviewed, generally unremarkable.  He does have mild decrease in hemoglobin, though within range of prior results.  He remains hemodynamically unremarkable with no hypotension, no tachycardia, given the description of bleeding following defecation or suspicion for diverticulosis versus hemorrhoidal processes.  Patient comfortable with return to his nursing facility, can pursue outpatient GI follow-up from there. MDM Rules/Calculators/A&P MDM Number of Diagnoses or Management Options Rectal bleeding: new, needed workup   Amount and/or Complexity of Data Reviewed Clinical lab tests: ordered and reviewed Tests in the medicine section of CPT: reviewed and ordered Decide to obtain previous medical records or to obtain history from someone other than the patient: yes Obtain history from someone other than the patient: yes Review and summarize past medical records: yes Independent visualization of images, tracings, or specimens: yes  Risk of Complications, Morbidity, and/or Mortality Presenting problems: high Diagnostic procedures: high Management options: high  Critical Care Total time providing critical care: < 30 minutes  Patient Progress Patient progress: stable   Final Clinical Impression(s) / ED Diagnoses Final diagnoses:  Rectal bleeding     Carmin Muskrat,  MD 10/27/20 [phone]

## 2020-10-28 DIAGNOSIS — E039 Hypothyroidism, unspecified: Secondary | ICD-10-CM | POA: Diagnosis not present

## 2020-11-06 DIAGNOSIS — J449 Chronic obstructive pulmonary disease, unspecified: Secondary | ICD-10-CM | POA: Diagnosis not present

## 2020-11-09 DIAGNOSIS — Z6821 Body mass index (BMI) 21.0-21.9, adult: Secondary | ICD-10-CM | POA: Diagnosis not present

## 2020-11-09 DIAGNOSIS — R159 Full incontinence of feces: Secondary | ICD-10-CM | POA: Diagnosis not present

## 2020-11-09 DIAGNOSIS — R197 Diarrhea, unspecified: Secondary | ICD-10-CM | POA: Diagnosis not present

## 2020-11-20 DIAGNOSIS — R531 Weakness: Secondary | ICD-10-CM | POA: Diagnosis not present

## 2020-11-20 DIAGNOSIS — J449 Chronic obstructive pulmonary disease, unspecified: Secondary | ICD-10-CM | POA: Diagnosis not present

## 2020-11-20 DIAGNOSIS — C3490 Malignant neoplasm of unspecified part of unspecified bronchus or lung: Secondary | ICD-10-CM | POA: Diagnosis not present

## 2020-11-20 DIAGNOSIS — J439 Emphysema, unspecified: Secondary | ICD-10-CM | POA: Diagnosis not present

## 2020-11-23 ENCOUNTER — Ambulatory Visit: Payer: PPO | Admitting: Internal Medicine

## 2020-11-26 ENCOUNTER — Ambulatory Visit: Payer: PPO | Admitting: Internal Medicine

## 2020-11-26 DIAGNOSIS — J449 Chronic obstructive pulmonary disease, unspecified: Secondary | ICD-10-CM | POA: Diagnosis not present

## 2020-11-26 DIAGNOSIS — E039 Hypothyroidism, unspecified: Secondary | ICD-10-CM | POA: Diagnosis not present

## 2020-12-01 DIAGNOSIS — R040 Epistaxis: Secondary | ICD-10-CM | POA: Diagnosis not present

## 2020-12-06 DIAGNOSIS — J449 Chronic obstructive pulmonary disease, unspecified: Secondary | ICD-10-CM | POA: Diagnosis not present

## 2020-12-20 DIAGNOSIS — R531 Weakness: Secondary | ICD-10-CM | POA: Diagnosis not present

## 2020-12-20 DIAGNOSIS — J449 Chronic obstructive pulmonary disease, unspecified: Secondary | ICD-10-CM | POA: Diagnosis not present

## 2020-12-20 DIAGNOSIS — J439 Emphysema, unspecified: Secondary | ICD-10-CM | POA: Diagnosis not present

## 2020-12-20 DIAGNOSIS — C3490 Malignant neoplasm of unspecified part of unspecified bronchus or lung: Secondary | ICD-10-CM | POA: Diagnosis not present

## 2020-12-28 ENCOUNTER — Ambulatory Visit (INDEPENDENT_AMBULATORY_CARE_PROVIDER_SITE_OTHER): Payer: PPO | Admitting: Physician Assistant

## 2020-12-28 ENCOUNTER — Encounter: Payer: Self-pay | Admitting: Physician Assistant

## 2020-12-28 ENCOUNTER — Other Ambulatory Visit: Payer: Self-pay

## 2020-12-28 VITALS — BP 126/55 | HR 68 | Ht 71.0 in | Wt 148.0 lb

## 2020-12-28 DIAGNOSIS — F0390 Unspecified dementia without behavioral disturbance: Secondary | ICD-10-CM | POA: Diagnosis not present

## 2020-12-28 MED ORDER — DONEPEZIL HCL 10 MG PO TABS: ORAL_TABLET | ORAL | 11 refills | Status: DC

## 2020-12-28 NOTE — Progress Notes (Signed)
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  Discussed the importance of regular daily schedule with inclusion of crossword puzzles to maintain brain function.  Continue to monitor mood by PCP Stay active at least 30 minutes at least 3 times a week.  Naps should be scheduled and should be no longer than 60 minutes and should not occur after 2 PM.  Mediterranean diet is recommended  Continue donepezil 10 mg daily Side effects were discussed Follow up in  6 months.   Case discussed with Dr. Delice Lesch who agrees with the plan    Subjective:   ED visits since last seen: none  Hospital admissions: none  Ian Aguilar is a 80 y.o. 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 MoCA during his last visit in August 2022 was 11/30 seen today in follow up for memory loss. This patient is accompanied in the office by his caretaker Amber who supplements the history.  Previous records as well as any outside records available were reviewed prior to todays visit.  Patient is currently on donepezil 10 mg daily, tolerating well Since his last visit, the patient reports that his memory is actually improving, a combination of increasing his activity with the help of PT, enjoying  high [facility] in [place], having met new acquaintances at the facility, good management of his medications is specifically of the thyroid levels, and overall "moving on "the grieving process after the loss of his wife.  He  reports that he likes to stroll the facility "he is becoming a butterfly "-caregiver says.  He enjoys doing crossword puzzles, "2 hours at the time".  He fell to days ago as he was going into bed, sustaining a witnessed fall on the left hip, with minimal trauma, without any fractures.  No loss of consciousness.  He had slight head trauma without any residual.  While during the last visit he could not remember the names of his children and grandchildren, he is able to name them without difficulty now.  His sleep is good, denies vivid dreams or sleepwalking, paranoia or hallucinations.  Occasionally he takes a nap  He needs assistance with bathing and dressing as his gait is chronically unstable.  Medications as mentioned before, given by the facility and staff, and the finances are managed by his son and daughter, POA.  He denies leaving objects in unusual places.  Appetite is good, denies trouble swallowing.  He ambulates with a walker most of the time, occasionally at a wheelchair as well.  The wheelchair helps him to carry the oxygen with him.  He no longer drives.  He reports that he wants to get better so that he can drive again.  He denies any headaches, double vision, dizziness, focal numbness or tingling, unilateral weakness or tremors, urine incontinence, constipation, diarrhea or anosmia.  Of note, he had COVID in August 2022, minimally symptomatic, treated with Paxlovid.    Initial Visit 09/27/20 The patient is seen in neurologic consultation at the request of [employer], MD for the evaluation of memory.  The patient is accompanied by caregiver who supplements the history. He is a 80 y.o. year old RH  male who has had memory issues for about  several years, worse over the last 6 months, when he lost his wife.  At the time, he went into a deep depression, as he was planning to move with her at high [facility] in [place], and he was going to take his dog with him.  On his wife's  death, he felt "lost, did not want to get out of the bed, did not want to interact with anyone, and he was feeling very irritable ".  Sertraline was tried, without any improvement.  It was during that time, that he was found to be severely hypothyroid with a TSH of 17, and was started on thyroid medication, which improved significantly his mood, and irritability.  He reports his memory being "not good, especially with long-term memory ".  He states that at times he cannot remember the names of his grandmother, grandfather, or children. While before he was sleeping the whole day, now he sleeps well at night, without vivid dreams or sleepwalking, paranoia or hallucinations.  Occasionally he takes a nap. He needs assistance with bathing and dressing, as his gait is chronically unstable.  Medications are given by the facility staff, and the finances are managed by his son and daughter who is the power of attorney.  Denies leaving objects in unusual places.  Appetite is better, denies trouble swallowing.  He does not cook.  "My wife used to do all the cooking".  He ambulates with the walker very short distances, otherwise he uses a wheelchair to help move from one side to the other, as he is very weak, and has an oxygen tank with him.  He does participate in physical therapy at the facility.  He denies any recent falls.  He no longer drives because of "memory issues ".  His son "was afraid that I would get lost so he took the keys".  He denies any headaches, although he had an injury to the head in May 2022, with negative work-up at the ER.  He denies double vision, dizziness, focal numbness or tingling, unilateral weakness or tremors, urine incontinence or retention.  Denies constipation, diarrhea, or anosmia.  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  He quit heavy alcohol 10 years ago mostly beer and bourbon.  He quit 1 pack a day tobacco use for most of his life.  Family history remarkable for  dementia in father.     Labs 07/19/20  CBC and CMP unremarkable UA neg  TSH 05/13/20 17.277 High, T4 1.28 elev B12 04/22/20 645 nl   CT head wo contrast 07/19/20  No evidence of acute intracranial abnormality.Right anterior scalp hematoma.Stable mild-to-moderate cerebral atrophy and cerebral white matter chronic small vessel ischemic disease. Known small chronic right cerebellar infarct.Mild paranasal sinus mucosal thickening at the imaged levels, most notably right maxillary. Right mastoid effusion.   PREVIOUS MEDICATIONS:   CURRENT MEDICATIONS:  Outpatient Encounter Medications as of 12/28/2020  Medication Sig   cholecalciferol (VITAMIN D) 1000 units tablet Take 1,000 Units by mouth daily.   docusate sodium (COLACE) 100 MG capsule Take 1 capsule (100 mg total) by mouth 2 (two) times daily.   donepezil (ARICEPT) 10 MG tablet Take half tablet (5 mg) daily for 2 weeks, then increase to the full tablet at 10 mg daily   feeding supplement (ENSURE ENLIVE / ENSURE  PLUS) LIQD Take 237 mLs by mouth 2 (two) times daily between meals.   GOODSENSE ARTIFICIAL TEARS 0.5-0.6 % SOLN Apply to eye.   guaiFENesin (MUCINEX) 600 MG 12 hr tablet Take 600 mg by mouth 2 (two) times daily as needed for cough (congestion).   levothyroxine (SYNTHROID) 150 MCG tablet Take 150 mcg by mouth daily.   NON FORMULARY Diet NAS   OXYGEN Inhale 2 L into the lungs continuous.   polyethylene glycol (MIRALAX / GLYCOLAX) 17 g packet Take 17 g by mouth daily.   rosuvastatin (CRESTOR) 10 MG tablet Take 1 tablet (10 mg total) by mouth daily.   SPIRIVA RESPIMAT 2.5 MCG/ACT AERS Inhale 2 puffs into the lungs daily.   triamcinolone (KENALOG) 0.025 % ointment Apply 1 application topically 2 (two) times daily. Apply to face and arms sparingly for dryness   gabapentin (NEURONTIN) 100 MG capsule Take 1 capsule (100 mg total) by mouth 3 (three) times daily for 14 days.   No facility-administered encounter medications on file as of  12/28/2020.     Objective:     PHYSICAL EXAMINATION:    VITALS:   Vitals:   12/28/20 1105  BP: (!) 126/55  Pulse: 68  SpO2: 97%  Weight: 148 lb (67.1 kg)  Height: $Remove'5\' 11"'fZPGGWq$  (1.803 m)    GEN:  The patient appears stated age and is in NAD. HEENT:  Normocephalic, atraumatic.   Neurological examination:  General: NAD, well-groomed, appears stated age. Orientation: The patient is alert. Oriented to person, place and date Cranial nerves: There is good facial symmetry.The speech is fluent and clear. No aphasia or dysarthria. Fund of knowledge is appropriate. Recent memory is impaired, remote memory is normal.  Attention and concentration are normal  Able to name objects and repeat phrases.  Hearing is intact to conversational tone.    Sensation: Sensation is intact to light touch throughout Motor: Strength is at least antigravity x4. Tremors: none  DTR's 2/4 in UE/ 1/4 LE     Montreal Cognitive Assessment  09/27/2020  Visuospatial/ Executive (0/5) 0  Naming (0/3) 2  Attention: Read list of digits (0/2) 2  Attention: Read list of letters (0/1) 1  Attention: Serial 7 subtraction starting at 100 (0/3) 1  Language: Repeat phrase (0/2) 1  Language : Fluency (0/1) 0  Abstraction (0/2) 0  Delayed Recall (0/5) 0  Orientation (0/6) 4  Total 11  Adjusted Score (based on education) 11   No flowsheet data found.  No flowsheet data found.     Movement examination: Tone: There is normal tone in the UE/LE Abnormal movements:  no tremor.  No myoclonus.  No asterixis.   Coordination:  There is no decremation with RAM's. Normal finger to nose  Gait and Station: The patient is in wheelchair so gait was not tested       Total time spent on today's visit was 40 minutes, including both face-to-face time and nonface-to-face time. Time included that spent on review of records (prior notes available to me/labs/imaging if pertinent), discussing treatment and goals, answering patient's questions  and coordinating care.  Cc:  Sharilyn Sites, MD Sharene Butters, PA-C

## 2020-12-28 NOTE — Patient Instructions (Signed)
It was a pleasure to see you today at our office.   Recommendations:  Follow up in  6 months Continue donepezil 10 mg daily. Side effects were discussed    RECOMMENDATIONS FOR ALL PATIENTS WITH MEMORY PROBLEMS: 1. Continue to exercise (Recommend 30 minutes of walking everyday, or 3 hours every week) 2. Increase social interactions - continue going to [place] and enjoy social gatherings with friends and family 3. Eat healthy, avoid fried foods and eat more fruits and vegetables 4. Maintain adequate blood pressure, blood sugar, and blood cholesterol level. Reducing the risk of stroke and cardiovascular disease also helps promoting better memory. 5. Avoid stressful situations. Live a simple life and avoid aggravations. Organize your time and prepare for the next day in anticipation. 6. Sleep well, avoid any interruptions of sleep and avoid any distractions in the bedroom that may interfere with adequate sleep quality 7. Avoid sugar, avoid sweets as there is a strong link between excessive sugar intake, diabetes, and cognitive impairment We discussed the Mediterranean diet, which has been shown to help patients reduce the risk of progressive memory disorders and reduces cardiovascular risk. This includes eating fish, eat fruits and green leafy vegetables, nuts like almonds and hazelnuts, walnuts, and also use olive oil. Avoid fast foods and fried foods as much as possible. Avoid sweets and sugar as sugar use has been linked to worsening of memory function.  There is always a concern of gradual progression of memory problems. If this is the case, then we may need to adjust level of care according to patient needs. Support, both to the patient and caregiver, should then be put into place.    FALL PRECAUTIONS: Be cautious when walking. Scan the area for obstacles that may increase the risk of trips and falls. When getting up in the mornings, sit up at the edge of the bed for a few minutes before getting  out of bed. Consider elevating the bed at the head end to avoid drop of blood pressure when getting up. Walk always in a well-lit room (use night lights in the walls). Avoid area rugs or power cords from appliances in the middle of the walkways. Use a walker or a cane if necessary and consider physical therapy for balance exercise. Get your eyesight checked regularly.  FINANCIAL OVERSIGHT: Supervision, especially oversight when making financial decisions or transactions is also recommended.  HOME SAFETY: Consider the safety of the kitchen when operating appliances like stoves, microwave oven, and blender. Consider having supervision and share cooking responsibilities until no longer able to participate in those. Accidents with firearms and other hazards in the house should be identified and addressed as well.   ABILITY TO BE LEFT ALONE: If patient is unable to contact 911 operator, consider using LifeLine, or when the need is there, arrange for someone to stay with patients. Smoking is a fire hazard, consider supervision or cessation. Risk of wandering should be assessed by caregiver and if detected at any point, supervision and safe proof recommendations should be instituted.  MEDICATION SUPERVISION: Inability to self-administer medication needs to be constantly addressed. Implement a mechanism to ensure safe administration of the medications.   DRIVING: Regarding driving, in patients with progressive memory problems, driving will be impaired. We advise to have someone else do the driving if trouble finding directions or if minor accidents are reported. Independent driving assessment is available to determine safety of driving.   If you are interested in the driving assessment, you can contact the following:  [employer] in [place]  [place] [phone]  [place]  [facility] [phone] or [phone]

## 2020-12-29 ENCOUNTER — Inpatient Hospital Stay (HOSPITAL_COMMUNITY): Payer: PPO | Attending: Hematology

## 2020-12-29 DIAGNOSIS — G629 Polyneuropathy, unspecified: Secondary | ICD-10-CM | POA: Diagnosis not present

## 2020-12-29 DIAGNOSIS — D509 Iron deficiency anemia, unspecified: Secondary | ICD-10-CM | POA: Insufficient documentation

## 2020-12-29 DIAGNOSIS — C3411 Malignant neoplasm of upper lobe, right bronchus or lung: Secondary | ICD-10-CM | POA: Insufficient documentation

## 2020-12-29 DIAGNOSIS — Z79899 Other long term (current) drug therapy: Secondary | ICD-10-CM | POA: Diagnosis not present

## 2020-12-29 DIAGNOSIS — Z87891 Personal history of nicotine dependence: Secondary | ICD-10-CM | POA: Insufficient documentation

## 2020-12-29 DIAGNOSIS — C3491 Malignant neoplasm of unspecified part of right bronchus or lung: Secondary | ICD-10-CM

## 2020-12-29 LAB — CBC WITH DIFFERENTIAL/PLATELET
Abs Immature Granulocytes: 0 10*3/uL (ref 0.00–0.07)
Basophils Absolute: 0 10*3/uL (ref 0.0–0.1)
Basophils Relative: 1 %
Eosinophils Absolute: 0.1 10*3/uL (ref 0.0–0.5)
Eosinophils Relative: 3 %
HCT: 34.5 % — ABNORMAL LOW (ref 39.0–52.0)
Hemoglobin: 10.9 g/dL — ABNORMAL LOW (ref 13.0–17.0)
Immature Granulocytes: 0 %
Lymphocytes Relative: 20 %
Lymphs Abs: 0.8 10*3/uL (ref 0.7–4.0)
MCH: 30.7 pg (ref 26.0–34.0)
MCHC: 31.6 g/dL (ref 30.0–36.0)
MCV: 97.2 fL (ref 80.0–100.0)
Monocytes Absolute: 0.3 10*3/uL (ref 0.1–1.0)
Monocytes Relative: 8 %
Neutro Abs: 2.8 10*3/uL (ref 1.7–7.7)
Neutrophils Relative %: 68 %
Platelets: 132 10*3/uL — ABNORMAL LOW (ref 150–400)
RBC: 3.55 MIL/uL — ABNORMAL LOW (ref 4.22–5.81)
RDW: 14.4 % (ref 11.5–15.5)
WBC: 4.1 10*3/uL (ref 4.0–10.5)
nRBC: 0 % (ref 0.0–0.2)

## 2020-12-29 LAB — COMPREHENSIVE METABOLIC PANEL
ALT: 13 U/L (ref 0–44)
AST: 18 U/L (ref 15–41)
Albumin: 3.7 g/dL (ref 3.5–5.0)
Alkaline Phosphatase: 74 U/L (ref 38–126)
Anion gap: 6 (ref 5–15)
BUN: 21 mg/dL (ref 8–23)
CO2: 35 mmol/L — ABNORMAL HIGH (ref 22–32)
Calcium: 8.9 mg/dL (ref 8.9–10.3)
Chloride: 99 mmol/L (ref 98–111)
Creatinine, Ser: 0.74 mg/dL (ref 0.61–1.24)
GFR, Estimated: >60 mL/min (ref >60)
Glucose, Bld: 96 mg/dL (ref 70–99)
Potassium: 3.9 mmol/L (ref 3.5–5.1)
Sodium: 140 mmol/L (ref 135–145)
Total Bilirubin: 0.5 mg/dL (ref 0.3–1.2)
Total Protein: 6.5 g/dL (ref 6.5–8.1)

## 2020-12-29 MED ORDER — HEPARIN SOD (PORK) LOCK FLUSH 100 UNIT/ML IV SOLN
500.0000 [IU] | Freq: Once | INTRAVENOUS | Status: AC
  Administered 2020-12-29: 500 [IU] via INTRAVENOUS

## 2020-12-29 MED ORDER — SODIUM CHLORIDE 0.9% FLUSH
10.0000 mL | INTRAVENOUS | Status: DC | PRN
  Administered 2020-12-29: 10 mL via INTRAVENOUS

## 2020-12-29 NOTE — Patient Instructions (Signed)
[facility]  Discharge Instructions: Thank you for choosing [facility] to provide your oncology and hematology care.  If you have a lab appointment with the Cancer Center, please come in thru the Main Entrance and check in at the main information desk.  Wear comfortable clothing and clothing appropriate for easy access to any Portacath or PICC line.   We strive to give you quality time with your provider. You may need to reschedule your appointment if you arrive late (15 or more minutes).  Arriving late affects you and other patients whose appointments are after yours.  Also, if you miss three or more appointments without notifying the office, you may be dismissed from the clinic at the provider's discretion.      For prescription refill requests, have your pharmacy contact our office and allow 72 hours for refills to be completed.        To help prevent nausea and vomiting after your treatment, we encourage you to take your nausea medication as directed.  BELOW ARE SYMPTOMS THAT SHOULD BE REPORTED IMMEDIATELY: *FEVER GREATER THAN 100.4 F (38 C) OR HIGHER *CHILLS OR SWEATING *NAUSEA AND VOMITING THAT IS NOT CONTROLLED WITH YOUR NAUSEA MEDICATION *UNUSUAL SHORTNESS OF BREATH *UNUSUAL BRUISING OR BLEEDING *URINARY PROBLEMS (pain or burning when urinating, or frequent urination) *BOWEL PROBLEMS (unusual diarrhea, constipation, pain near the anus) TENDERNESS IN MOUTH AND THROAT WITH OR WITHOUT PRESENCE OF ULCERS (sore throat, sores in mouth, or a toothache) UNUSUAL RASH, SWELLING OR PAIN  UNUSUAL VAGINAL DISCHARGE OR ITCHING   Items with * indicate a potential emergency and should be followed up as soon as possible or go to the Emergency Department if any problems should occur.  Please show the CHEMOTHERAPY ALERT CARD or IMMUNOTHERAPY ALERT CARD at check-in to the Emergency Department and triage nurse.  Should you have questions after your visit or need to cancel  or reschedule your appointment, please contact [facility] [phone]  and follow the prompts.  Office hours are 8:00 a.m. to 4:30 p.m. Monday - Friday. Please note that voicemails left after 4:00 p.m. may not be returned until the following business day.  We are closed weekends and major holidays. You have access to a nurse at all times for urgent questions. Please call the main number to the clinic [phone] and follow the prompts.  For any non-urgent questions, you may also contact your provider using MyChart. We now offer e-Visits for anyone 18 and older to request care online for non-urgent symptoms. For details visit [email or web address].   Also download the MyChart app! Go to the app store, search "MyChart", open the app, select , and log in with your MyChart username and password.  Due to Covid, a mask is required upon entering the hospital/clinic. If you do not have a mask, one will be given to you upon arrival. For doctor visits, patients may have 1 support person aged 18 or older with them. For treatment visits, patients cannot have anyone with them due to current Covid guidelines and our immunocompromised population.  

## 2020-12-29 NOTE — Progress Notes (Signed)
Patients port flushed without difficulty.  Good blood return noted with no bruising or swelling noted at site.  Band aid applied.  VSS with discharge and left in satisfactory condition with no s/s of distress noted.   

## 2020-12-30 ENCOUNTER — Ambulatory Visit (HOSPITAL_COMMUNITY)
Admission: RE | Admit: 2020-12-30 | Discharge: 2020-12-30 | Disposition: A | Discharge status: Home / Self Care | Payer: PPO | Source: Ambulatory Visit | Attending: Hematology | Admitting: Hematology

## 2020-12-30 ENCOUNTER — Other Ambulatory Visit: Payer: Self-pay

## 2020-12-30 DIAGNOSIS — C3490 Malignant neoplasm of unspecified part of unspecified bronchus or lung: Secondary | ICD-10-CM | POA: Diagnosis not present

## 2020-12-30 DIAGNOSIS — R911 Solitary pulmonary nodule: Secondary | ICD-10-CM | POA: Diagnosis not present

## 2020-12-30 DIAGNOSIS — J439 Emphysema, unspecified: Secondary | ICD-10-CM | POA: Diagnosis not present

## 2020-12-30 DIAGNOSIS — C3491 Malignant neoplasm of unspecified part of right bronchus or lung: Secondary | ICD-10-CM | POA: Insufficient documentation

## 2020-12-30 DIAGNOSIS — I700 Atherosclerosis of aorta: Secondary | ICD-10-CM | POA: Diagnosis not present

## 2020-12-30 MED ORDER — IOHEXOL 300 MG/ML  SOLN
75.0000 mL | Freq: Once | INTRAMUSCULAR | Status: AC | PRN
  Administered 2020-12-30: 75 mL via INTRAVENOUS

## 2021-01-01 NOTE — Progress Notes (Signed)
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 Chemoradiation with carboplatin and paclitaxel weekly from 06/11/2017 to 07/16/2017. 2. Consolidation with durvalumab from 09/19/2017 to 09/17/2018.  NGS Results: not done  CURRENT THERAPY: surveillance  BRIEF ONCOLOGIC HISTORY:  Oncology History  Carcinoma, lung ([place])  06/05/2017 Initial Diagnosis   Carcinoma, lung ([place])   06/11/2017 - 07/16/2017 Chemotherapy   The patient had dexamethasone (DECADRON) 4 MG tablet, 8 mg, Oral, Daily, 1 of 1 cycle, Start date: --, End date: -- palonosetron (ALOXI) injection 0.25 mg, 0.25 mg, Intravenous,  Once, 6 of 6 cycles Administration: 0.25 mg (06/11/2017), 0.25 mg (07/09/2017), 0.25 mg (07/16/2017), 0.25 mg (06/18/2017), 0.25 mg (06/25/2017), 0.25 mg (07/02/2017) CARBOplatin (PARAPLATIN) 190 mg in sodium chloride 0.9 % 250 mL chemo infusion, 190 mg (100 % of original dose 192.8 mg), Intravenous,  Once, 6 of 6 cycles Dose modification:   (original dose 192.8 mg, Cycle 1),   (original dose 192.8 mg, Cycle 5), 144.6 mg (original dose 144.6 mg, Cycle 6),   (original dose 192.8 mg, Cycle 2),   (original dose 192.8 mg, Cycle 3),   (original dose 192.8 mg, Cycle 4) Administration: 190 mg (06/11/2017), 190 mg (07/09/2017), 140 mg (07/16/2017), 190 mg (06/18/2017), 190 mg (06/25/2017), 190 mg (07/02/2017) PACLitaxel (TAXOL) 90 mg in dextrose 5 % 250 mL chemo infusion (</= 71m/m2), 45 mg/m2 = 90 mg, Intravenous,  Once, 6 of 6 cycles Dose modification: 40.5 mg/m2 (90 % of original dose 45 mg/m2, Cycle 5, Reason: Other (see comments), Comment: early neuropathy), 36 mg/m2 (80 % of original dose 45 mg/m2, Cycle 6, Reason: Provider Judgment) Administration: 90 mg (06/11/2017), 78 mg  (07/09/2017), 72 mg (07/16/2017), 90 mg (06/18/2017), 78 mg (06/25/2017), 78 mg (07/02/2017)   for chemotherapy treatment.     Malignant neoplasm of bronchus and lung ([facility]  06/08/2017 Initial Diagnosis   Malignant neoplasm of bronchus and lung ([facility]   09/20/2017 -  Chemotherapy   The patient had durvalumab (IMFINZI) 860 mg in sodium chloride 0.9 % 100 mL chemo infusion, 800 mg, Intravenous,  Once, 26 of 26 cycles Administration: 860 mg (09/20/2017), 860 mg (10/04/2017), 860 mg (10/18/2017), 860 mg (11/01/2017), 860 mg (11/15/2017), 860 mg (11/29/2017), 860 mg (12/13/2017), 860 mg (12/27/2017), 860 mg (01/10/2018), 860 mg (01/28/2018), 860 mg (02/11/2018), 860 mg (02/25/2018), 860 mg (03/11/2018), 860 mg (03/25/2018), 860 mg (04/08/2018), 860 mg (04/22/2018), 860 mg (05/08/2018), 860 mg (05/27/2018), 860 mg (06/10/2018), 860 mg (06/25/2018), 860 mg (07/09/2018), 860 mg (07/23/2018), 860 mg (08/06/2018), 860 mg (08/20/2018), 860 mg (09/03/2018), 860 mg (09/17/2018)   for chemotherapy treatment.       CANCER STAGING: Cancer Staging No matching staging information was found for the patient.  INTERVAL HISTORY:  Ian Aguilar a 80y.o. male, returns for routine follow-up of his stage II right upper lobe adenocarcinoma. [name] last seen on 06/29/2020.   Today he reports feeling good. He is able to walk with the assistance of a walker. He denies any current bleeding and black stools. He has not previously taken iron or vitamin B-12 tablets.   REVIEW OF SYSTEMS:  Review of Systems  Constitutional:  Negative for appetite change and fatigue (75%).  HENT:   Negative for nosebleeds.   Respiratory:  Positive for shortness of breath. Negative for hemoptysis.   Gastrointestinal:  Negative for blood in stool.  Genitourinary:  Negative for hematuria.   Musculoskeletal:  Positive for arthralgias (4/10 L hip).  Neurological:  Positive for dizziness.  Hematological:  Does not bruise/bleed easily.  All other systems  reviewed and are negative.  PAST MEDICAL/SURGICAL HISTORY:  Past Medical History:  Diagnosis Date   Anxiety    Arthritis    Cancer ([place])    Cirrhosis ([place])    confirmed by MRI on 07/04/11.     COPD (chronic obstructive pulmonary disease) ([facility])    Depression with anxiety    ETOH abuse    Hyperlipidemia    Hypertension    diet control   Nodule of upper lobe of right lung    with mediastinal adenopathy   Wears dentures    Wears glasses    Past Surgical History:  Procedure Laterality Date   bilateral cataract surgery     [place]   broken arm  6 yrs ago   left otif of wrist-[facility]   CLOSED REDUCTION WRIST FRACTURE Right 09/02/2015   Procedure: RIGHT WRIST REDUCTION;  Surgeon: Renette Butters, MD;  Location: [facility];  Service: Orthopedics;  Laterality: Right;  with MAC   COLONOSCOPY  1996   [facility]: external hemorrhoids, no polyps   COLONOSCOPY  06/28/2011   Dr. Ala Bent diverticulosis,tubular adenoma, hyperplastic polyp   COLONOSCOPY WITH PROPOFOL N/A 04/26/2017   Dr. Gala Romney: perianal and digital rectal examinations normal, diffusely congested colonic mucosa but otherwise normal   ESOPHAGOGASTRODUODENOSCOPY  06/28/2011   Dr. [facility] Aus erosive reflux esophagitis, hiatal hernia-gastritis   ESOPHAGOGASTRODUODENOSCOPY (EGD) WITH PROPOFOL N/A 04/26/2017   Dr. Gala Romney: normal esophagus, small hiatal hernia, GAVE, portal hypertensive gastropathy, normal duodenal bulb and second portion, no specimens collected. 2 years screening    ESOPHAGOGASTRODUODENOSCOPY (EGD) WITH PROPOFOL N/A 06/19/2019   normal esophagus, portal gastropathy, GAVE, normal duodenum. 2 years screening.    EXTERNAL EAR SURGERY     right ear-cleaned out ear and created new eardrum   INGUINAL HERNIA REPAIR  2-3 yrs ago   left-[facility]   INTRAMEDULLARY (IM) NAIL INTERTROCHANTERIC Right 09/02/2015   Procedure: RIGHT  INTERTROCHANTRIC HIP;  Surgeon: Renette Butters, MD;  Location: [facility];  Service: Orthopedics;   Laterality: Right;  With MAC   KNEE SURGERY     left-arthroscopy-[facility]   PORTACATH PLACEMENT Left 06/08/2017   Procedure: INSERTION POWER PORT WITH  ATTACHED CATHETER LEFT SUBCLAVIAN;  Surgeon: Aviva Signs, MD;  Location: [facility];  Service: General;  Laterality: Left;   SKULL FRACTURE ELEVATION     fractured cheeck bone repaired   TOTAL HIP ARTHROPLASTY  12/18/2011   Procedure: TOTAL HIP ARTHROPLASTY;  Surgeon: Carole Civil, MD;  Location: [facility];  Service: Orthopedics;  Laterality: Left;   VIDEO BRONCHOSCOPY WITH ENDOBRONCHIAL ULTRASOUND N/A 05/04/2017   Procedure: VIDEO BRONCHOSCOPY WITH ENDOBRONCHIAL ULTRASOUND;  Surgeon: Melrose Nakayama, MD;  Location: [facility];  Service: Thoracic;  Laterality: N/A;   WISDOM TOOTH EXTRACTION      SOCIAL HISTORY:  Social History   Socioeconomic History   Marital status: Widowed    Spouse name: Not on file   Number of children: 2   Years of education: 89   Highest education level: Not on file  Occupational History   Occupation: retired    Fish farm manager: [employer]  Tobacco Use   Smoking status: Former    Packs/day: 0.00    Years: 62.00  Pack years: 0.00    Types: Cigarettes    Quit date: 02/28/2019    Years since quitting: 1.8   Smokeless tobacco: Never   Tobacco comments:    1/2 ppd > 60 years as of 06/27/17: 4-5 cigarettes or less a day  Vaping Use   Vaping Use: Never used  Substance and Sexual Activity   Alcohol use: Not Currently    Alcohol/week: 2.0 standard drinks    Types: 2 Cans of beer per week   Drug use: No   Sexual activity: Yes    Birth control/protection: None  Other Topics Concern   Not on file  Social History Narrative   Right handed   Drinks caffeine   Assistant living   Social Determinants of Health   Financial Resource Strain: Not on file  Food Insecurity: Not on file  Transportation Needs: Not on file  Physical Activity: Not on file  Stress: Not on file  Social Connections: Not on file   Intimate Partner Violence: Not on file    FAMILY HISTORY:  Family History  Problem Relation Age of Onset   Stroke Mother 74   Heart disease Father 65       MI   Diabetes Maternal Uncle    Colon cancer Neg Hx    Cancer Neg Hx     CURRENT MEDICATIONS:  Current Outpatient Medications  Medication Sig Dispense Refill   cholecalciferol (VITAMIN D) 1000 units tablet Take 1,000 Units by mouth daily.     docusate sodium (COLACE) 100 MG capsule Take 1 capsule (100 mg total) by mouth 2 (two) times daily. 60 capsule 2   donepezil (ARICEPT) 10 MG tablet Take one tablet, 10 mg daily 30 tablet 11   feeding supplement (ENSURE ENLIVE / ENSURE PLUS) LIQD Take 237 mLs by mouth 2 (two) times daily between meals.     gabapentin (NEURONTIN) 100 MG capsule Take 1 capsule (100 mg total) by mouth 3 (three) times daily for 14 days. 42 capsule 0   GOODSENSE ARTIFICIAL TEARS 0.5-0.6 % SOLN Apply to eye.     guaiFENesin (MUCINEX) 600 MG 12 hr tablet Take 600 mg by mouth 2 (two) times daily as needed for cough (congestion).     levothyroxine (SYNTHROID) 150 MCG tablet Take 150 mcg by mouth daily.     NON FORMULARY Diet NAS     OXYGEN Inhale 2 L into the lungs continuous.     polyethylene glycol (MIRALAX / GLYCOLAX) 17 g packet Take 17 g by mouth daily. 14 each 0   rosuvastatin (CRESTOR) 10 MG tablet Take 1 tablet (10 mg total) by mouth daily. 30 tablet 0   SPIRIVA RESPIMAT 2.5 MCG/ACT AERS Inhale 2 puffs into the lungs daily.     triamcinolone (KENALOG) 0.025 % ointment Apply 1 application topically 2 (two) times daily. Apply to face and arms sparingly for dryness     No current facility-administered medications for this visit.    ALLERGIES:  No Known Allergies  PHYSICAL EXAM:  Performance status (ECOG): 1 - Symptomatic but completely ambulatory  There were no vitals filed for this visit. Wt Readings from Last 3 Encounters:  12/28/20 148 lb (67.1 kg)  10/27/20 147 lb (66.7 kg)  10/06/20 154 lb (69.9  kg)   Physical Exam Vitals reviewed.  Constitutional:      Appearance: Normal appearance.     Interventions: Nasal cannula in place.     Comments: In wheelchair  Cardiovascular:     Rate and  Rhythm: Normal rate and regular rhythm.     Pulses: Normal pulses.     Heart sounds: Normal heart sounds.  Pulmonary:     Effort: Pulmonary effort is normal.     Breath sounds: Normal breath sounds.  Neurological:     General: No focal deficit present.     Mental Status: He is alert and oriented to person, place, and time.  Psychiatric:        Mood and Affect: Mood normal.        Behavior: Behavior normal.     LABORATORY DATA:  I have reviewed the labs as listed.  CBC Latest Ref Rng & Units 12/29/2020 10/27/2020 07/19/2020  WBC 4.0 - 10.5 K/uL 4.1 4.9 5.1  Hemoglobin 13.0 - 17.0 g/dL 10.9(L) 12.9(L) 14.1  Hematocrit 39.0 - 52.0 % 34.5(L) 40.8 45.5  Platelets 150 - 400 K/uL 132(L) 171 229   CMP Latest Ref Rng & Units 12/29/2020 10/27/2020 07/19/2020  Glucose 70 - 99 mg/dL 96 91 126(H)  BUN 8 - 23 mg/dL 21 25(H) 16  Creatinine 0.61 - 1.24 mg/dL 0.74 0.89 0.80  Sodium 135 - 145 mmol/L 140 142 136  Potassium 3.5 - 5.1 mmol/L 3.9 4.2 4.5  Chloride 98 - 111 mmol/L 99 98 91(L)  CO2 22 - 32 mmol/L 35(H) 38(H) 37(H)  Calcium 8.9 - 10.3 mg/dL 8.9 9.0 9.2  Total Protein 6.5 - 8.1 g/dL 6.5 - 7.4  Total Bilirubin 0.3 - 1.2 mg/dL 0.5 - 0.6  Alkaline Phos 38 - 126 U/L 74 - 90  AST 15 - 41 U/L 18 - 22  ALT 0 - 44 U/L 13 - 16    DIAGNOSTIC IMAGING:  I have independently reviewed the scans and discussed with the patient. CT Chest W Contrast  Result Date: 12/30/2020 CLINICAL DATA:  Right upper lobe lung cancer restaging, status post chemotherapy and radiation EXAM: CT CHEST WITH CONTRAST TECHNIQUE: Multidetector CT imaging of the chest was performed during intravenous contrast administration. CONTRAST:  52m OMNIPAQUE IOHEXOL 300 MG/ML  SOLN COMPARISON:  06/22/2020 FINDINGS: Cardiovascular: Left chest  port catheter. Aortic atherosclerosis. Normal heart size. Three-vessel coronary artery calcifications. No pericardial effusion. Mediastinum/Nodes: No enlarged mediastinal, hilar, or axillary lymph nodes. Thyroid gland, trachea, and esophagus demonstrate no significant findings. Lungs/Pleura: Frothy debris in the proximal left mainstem bronchus (series 5, image 81). Moderate centrilobular emphysema. Diffuse bilateral bronchial wall thickening. Unchanged elevation of the left hemidiaphragm. Unchanged subpleural nodule of the posterior right pulmonary apex measuring 0.9 x 0.9 cm (series 4, image 33). Minimal, adjacent paramedian radiation fibrosis (series 4, image 46). Bandlike scarring of the posterior lingula (series 4, image 101). Interval enlargement of a ground-glass opacity of the central left upper lobe, measuring 1.9 x 1.6 cm, previously 1.4 x 1.4 cm (series 4, image 53). Additional tiny, scattered centrilobular nodules are stable and benign, most likely reflecting smoking-related respiratory bronchiolitis. No pleural effusion or pneumothorax. Upper Abdomen: No acute abnormality. Coarse, nodular cirrhotic morphology of the liver. Musculoskeletal: No chest wall mass or suspicious bone lesions identified. IMPRESSION: 1. Unchanged subpleural nodule of the posterior right pulmonary apex measuring 0.9 x 0.9 cm. Minimal, adjacent paramedian radiation fibrosis. 2. Interval enlargement of a well-circumscribed, rounded ground-glass opacity of the central left upper lobe, measuring 1.9 x 1.6 cm, previously 1.4 x 1.4 cm. Findings are concerning for indolent, metachronous adenocarcinoma. 3. Emphysema. 4. Coronary artery disease. 5. Cirrhosis. Aortic Atherosclerosis (ICD10-I70.0) and Emphysema (ICD10-J43.9). Electronically Signed   By: AJamse MeadD.  On: 12/30/2020 16:11     ASSESSMENT:  1.  Stage II (T1BN2) right upper lobe adenocarcinoma: -Chemoradiation therapy with carboplatin and paclitaxel weekly from  06/11/2017 through 07/17/2018. -Durvalumab consolidation from 09/19/2017 through 09/17/2018. -Denies any cough or hemoptysis. -I reviewed CT chest with contrast from 06/16/2019 which showed 9 mm nodule in the medial right upper lobe unchanged.  Adjacent radiation changes.  No evidence of progression or metastatic disease. -CT chest on 12/16/2019 showed stable radiation changes and small nodule in the medial right upper lobe with no signs of recurrence.  New bandlike area of septal thickening and micro nodularity in the right lateral and left anterior upper chest, most suggestive of sequela of infection/inflammation.  Signs of hepatic cirrhosis.   PLAN:  1.  Stage II (T1BN2) right upper lobe adenocarcinoma: - He is currently residing at [facility] assisted living facility after stroke. - He denies any change in his cough or hemoptysis. - We reviewed CT chest with contrast from 12/30/2020 which showed unchanged subpleural nodule of the posterior right pulmonary apex measuring 0.9 cm.  Interval enlargement of a well-circumscribed rounded groundglass opacity in the central left upper lobe measuring 1.9 x 1.6 cm.  PET scan would not be very helpful as this is a groundglass lesion.  Biopsy is also risky due to chance of pneumothorax. - We will do close monitoring of the left upper lobe groundglass opacity.  We will plan to recheck CT scan in 6 months.   2.  Neuropathy: - On and off numbness in the extremities stable.  No neuropathic pains.   3.  Hypothyroidism: - Continue Synthroid.  Will check TSH.  4.  Normocytic anemia: - His hemoglobin is 10.9, drop from 12.9 in August. - He reportedly was seen on 10/27/2020 with rectal bleed in ER.  He denies any bleeding per rectum after that episode. - We will check ferritin, iron panel, B58, folic acid, methylmalonic acid, copper, SPEP. - We will also request stool for occult blood checked at high [place] 3 to 4 weeks for follow-up.   Orders  placed this encounter:  No orders of the defined types were placed in this encounter.    Derek Jack, MD [facility] [place] [phone]   I, Thana Ates, am acting as a scribe for Dr. Derek Jack.  I, Derek Jack MD, have reviewed the above documentation for accuracy and completeness, and I agree with the above.

## 2021-01-03 ENCOUNTER — Inpatient Hospital Stay (HOSPITAL_BASED_OUTPATIENT_CLINIC_OR_DEPARTMENT_OTHER): Payer: PPO | Admitting: Hematology

## 2021-01-03 ENCOUNTER — Other Ambulatory Visit: Payer: Self-pay

## 2021-01-03 ENCOUNTER — Inpatient Hospital Stay (HOSPITAL_COMMUNITY): Payer: PPO

## 2021-01-03 VITALS — BP 137/117 | HR 57 | Temp 97.4°F | Resp 18 | Wt 150.2 lb

## 2021-01-03 DIAGNOSIS — C3491 Malignant neoplasm of unspecified part of right bronchus or lung: Secondary | ICD-10-CM

## 2021-01-03 DIAGNOSIS — C3411 Malignant neoplasm of upper lobe, right bronchus or lung: Secondary | ICD-10-CM | POA: Diagnosis not present

## 2021-01-03 LAB — FERRITIN: Ferritin: 25 ng/mL (ref 24–336)

## 2021-01-03 LAB — RETICULOCYTES
Immature Retic Fract: 8.7 % (ref 2.3–15.9)
RBC.: 3.83 MIL/uL — ABNORMAL LOW (ref 4.22–5.81)
Retic Count, Absolute: 50.2 10*3/uL (ref 19.0–186.0)
Retic Ct Pct: 1.3 % (ref 0.4–3.1)

## 2021-01-03 LAB — VITAMIN B12: Vitamin B-12: 306 pg/mL (ref 180–914)

## 2021-01-03 LAB — FOLATE: Folate: 16.6 ng/mL (ref >5.9)

## 2021-01-03 LAB — IRON AND TIBC
Iron: 74 ug/dL (ref 45–182)
Saturation Ratios: 23 % (ref 17.9–39.5)
TIBC: 325 ug/dL (ref 250–450)
UIBC: 251 ug/dL

## 2021-01-03 LAB — TSH: TSH: 0.01 u[IU]/mL — ABNORMAL LOW (ref 0.350–4.500)

## 2021-01-03 LAB — LACTATE DEHYDROGENASE: LDH: 134 U/L (ref 98–192)

## 2021-01-03 NOTE — Patient Instructions (Signed)
Ian Aguilar at [facility] Discharge Instructions  You were seen and examined today by Dr. Delton Coombes. He reviewed your most recent scan and everything looks good. Your labs were all good except for your Hemoglobin has dropped by 2 points since August 2022. Dr. Delton Coombes is ordering more labs to be done today to see what may have caused this. Please follow up as scheduled.   Thank you for choosing [place] at [facility] to provide your oncology and hematology care.  To afford each patient quality time with our provider, please arrive at least 15 minutes before your scheduled appointment time.   If you have a lab appointment with the [facility] please come in thru the Main Entrance and check in at the main information desk.  You need to re-schedule your appointment should you arrive 10 or more minutes late.  We strive to give you quality time with our providers, and arriving late affects you and other patients whose appointments are after yours.  Also, if you no show three or more times for appointments you may be dismissed from the clinic at the providers discretion.     Again, thank you for choosing [facility].  Our hope is that these requests will decrease the amount of time that you wait before being seen by our physicians.       _____________________________________________________________  Should you have questions after your visit to [facility], please contact our office at [phone] and follow the prompts.  Our office hours are 8:00 a.m. and 4:30 p.m. Monday - Friday.  Please note that voicemails left after 4:00 p.m. may not be returned until the following business day.  We are closed weekends and major holidays.  You do have access to a nurse 24-7, just call the main number to the clinic [phone] and do not press any options, hold on the line and a nurse will answer the phone.    For prescription refill  requests, have your pharmacy contact our office and allow 72 hours.    Due to Covid, you will need to wear a mask upon entering the hospital. If you do not have a mask, a mask will be given to you at the Main Entrance upon arrival. For doctor visits, patients may have 1 support person age 50 or older with them. For treatment visits, patients can not have anyone with them due to social distancing guidelines and our immunocompromised population.

## 2021-01-05 LAB — PROTEIN ELECTROPHORESIS, SERUM
A/G Ratio: 1.3 (ref 0.7–1.7)
Albumin ELP: 3.7 g/dL (ref 2.9–4.4)
Alpha-1-Globulin: 0.3 g/dL (ref 0.0–0.4)
Alpha-2-Globulin: 0.8 g/dL (ref 0.4–1.0)
Beta Globulin: 1.1 g/dL (ref 0.7–1.3)
Gamma Globulin: 0.7 g/dL (ref 0.4–1.8)
Globulin, Total: 2.9 g/dL (ref 2.2–3.9)
Total Protein ELP: 6.6 g/dL (ref 6.0–8.5)

## 2021-01-05 LAB — COPPER, SERUM: Copper: 108 ug/dL (ref 69–132)

## 2021-01-06 DIAGNOSIS — J449 Chronic obstructive pulmonary disease, unspecified: Secondary | ICD-10-CM | POA: Diagnosis not present

## 2021-01-06 LAB — METHYLMALONIC ACID, SERUM: Methylmalonic Acid, Quantitative: 507 nmol/L — ABNORMAL HIGH (ref 0–378)

## 2021-01-11 ENCOUNTER — Encounter: Payer: Self-pay | Admitting: Internal Medicine

## 2021-01-11 ENCOUNTER — Other Ambulatory Visit: Payer: Self-pay

## 2021-01-11 ENCOUNTER — Ambulatory Visit (INDEPENDENT_AMBULATORY_CARE_PROVIDER_SITE_OTHER): Payer: PPO | Admitting: Internal Medicine

## 2021-01-11 VITALS — BP 171/59 | HR 66 | Temp 96.9°F | Ht 71.0 in | Wt 149.2 lb

## 2021-01-11 DIAGNOSIS — K7469 Other cirrhosis of liver: Secondary | ICD-10-CM

## 2021-01-11 DIAGNOSIS — K921 Melena: Secondary | ICD-10-CM | POA: Diagnosis not present

## 2021-01-11 NOTE — Progress Notes (Signed)
Primary Care Physician:  Sharilyn Sites, MD Primary Gastroenterologist:  Dr. Gala Romney  Pre-Procedure History & Physical: HPI:  Ian Aguilar is a 80 y.o. male here for further evaluation of an episode of rectal bleeding.  About 2 months ago, (shortly after he was seen in our office) ,he developed a couple of days worth of nonbloody diarrhea.  However, in the midst of the diarrhea he did pass a couple of bloody stools).  He presented to the ED where he was evaluated.  The symptoms was resolved spontaneously; he tells me he is back to baseline now having 1 bowel movement daily to every other day.  Has not seen any black stool or red blood.  Glenard Haring, his caregiver, accompanies him and corroborates that report.  He seems to think it was due to the medication was started at the nursing home and then stopped(med unknown to me).  Caregiver states he was started on a probiotic shortly after this episode and continues to take it.  Patient has a history of EtOH related cirrhosis.  He is due for a screening EGD next year.  Also, patient has a history of colonic adenomatous polyps and portal colopathy; due for surveillance colonoscopy 2023 as well.  Patient states she is able to eat.  No nausea, vomiting ,abdominal pain GERD symptoms, odynophagia or dysphagia.    Past Medical History:  Diagnosis Date   Anxiety    Arthritis    Cancer ([place])    Cirrhosis ([place])    confirmed by MRI on 07/04/11.     COPD (chronic obstructive pulmonary disease) ([place])    Depression with anxiety    ETOH abuse    Hyperlipidemia    Hypertension    diet control   Nodule of upper lobe of right lung    with mediastinal adenopathy   Wears dentures    Wears glasses     Past Surgical History:  Procedure Laterality Date   bilateral cataract surgery     [place]   broken arm  6 yrs ago   left otif of wrist-[place]   CLOSED REDUCTION WRIST FRACTURE Right 09/02/2015   Procedure: RIGHT WRIST REDUCTION;  Surgeon: Renette Butters,  MD;  Location: [facility];  Service: Orthopedics;  Laterality: Right;  with MAC   COLONOSCOPY  1996   [facility]: external hemorrhoids, no polyps   COLONOSCOPY  06/28/2011   Dr. Ala Bent diverticulosis,tubular adenoma, hyperplastic polyp   COLONOSCOPY WITH PROPOFOL N/A 04/26/2017   Dr. Gala Romney: perianal and digital rectal examinations normal, diffusely congested colonic mucosa but otherwise normal   ESOPHAGOGASTRODUODENOSCOPY  06/28/2011   Dr. Chelsea Aus erosive reflux esophagitis, hiatal hernia-gastritis   ESOPHAGOGASTRODUODENOSCOPY (EGD) WITH PROPOFOL N/A 04/26/2017   Dr. Gala Romney: normal esophagus, small hiatal hernia, GAVE, portal hypertensive gastropathy, normal duodenal bulb and second portion, no specimens collected. 2 years screening    ESOPHAGOGASTRODUODENOSCOPY (EGD) WITH PROPOFOL N/A 06/19/2019   normal esophagus, portal gastropathy, GAVE, normal duodenum. 2 years screening.    EXTERNAL EAR SURGERY     right ear-cleaned out ear and created new eardrum   INGUINAL HERNIA REPAIR  2-3 yrs ago   left-[facility]   INTRAMEDULLARY (IM) NAIL INTERTROCHANTERIC Right 09/02/2015   Procedure: RIGHT  INTERTROCHANTRIC HIP;  Surgeon: Renette Butters, MD;  Location: [facility];  Service: Orthopedics;  Laterality: Right;  With MAC   KNEE SURGERY     left-arthroscopy-[place]   PORTACATH PLACEMENT Left 06/08/2017   Procedure: INSERTION POWER PORT WITH  ATTACHED CATHETER LEFT SUBCLAVIAN;  Surgeon: Aviva Signs, MD;  Location: [facility];  Service: General;  Laterality: Left;   SKULL FRACTURE ELEVATION     fractured cheeck bone repaired   TOTAL HIP ARTHROPLASTY  12/18/2011   Procedure: TOTAL HIP ARTHROPLASTY;  Surgeon: Carole Civil, MD;  Location: [facility];  Service: Orthopedics;  Laterality: Left;   VIDEO BRONCHOSCOPY WITH ENDOBRONCHIAL ULTRASOUND N/A 05/04/2017   Procedure: VIDEO BRONCHOSCOPY WITH ENDOBRONCHIAL ULTRASOUND;  Surgeon: Melrose Nakayama, MD;  Location: [facility];  Service: Thoracic;  Laterality: N/A;    WISDOM TOOTH EXTRACTION      Prior to Admission medications   Medication Sig Start Date End Date Taking? Authorizing Provider  cholecalciferol (VITAMIN D) 1000 units tablet Take 1,000 Units by mouth daily.   Yes [provider]  docusate sodium (COLACE) 100 MG capsule Take 1 capsule (100 mg total) by mouth 2 (two) times daily. 04/23/20 04/23/21 Yes Lavina Hamman, MD  donepezil (ARICEPT) 10 MG tablet Take one tablet, 10 mg daily 12/28/20  Yes Wertman, Coralee Pesa, PA-C  GOODSENSE ARTIFICIAL TEARS 0.5-0.6 % SOLN Apply to eye. 05/24/20  Yes [provider]  guaiFENesin (MUCINEX) 600 MG 12 hr tablet Take 600 mg by mouth 2 (two) times daily as needed for cough (congestion).   Yes [provider]  Lactobacillus-Inulin (CULTURELLE DIGESTIVE DAILY PO) Take by mouth daily.   Yes [provider]  levothyroxine (SYNTHROID) 150 MCG tablet Take 150 mcg by mouth daily. 09/20/20  Yes [provider]  NON FORMULARY Diet NAS 04/23/20  Yes [provider]  OXYGEN Inhale 2 L into the lungs continuous. 04/24/20  Yes [provider]  Psyllium (METAMUCIL PO) Take by mouth. 1 capsule twice daily   Yes [provider]  rosuvastatin (CRESTOR) 10 MG tablet Take 1 tablet (10 mg total) by mouth daily. 05/21/20  Yes Gerlene Fee, NP  SPIRIVA RESPIMAT 2.5 MCG/ACT AERS Inhale 2 puffs into the lungs daily. 05/31/20  Yes [provider]  triamcinolone (KENALOG) 0.025 % ointment Apply 1 application topically 2 (two) times daily. Apply to face and arms sparingly for dryness 05/24/20  Yes [provider]  feeding supplement (ENSURE ENLIVE / ENSURE PLUS) LIQD Take 237 mLs by mouth 2 (two) times daily between meals. Patient not taking: Reported on 01/11/2021 04/27/20   [provider]  gabapentin (NEURONTIN) 100 MG capsule Take 1 capsule (100 mg total) by mouth 3 (three) times daily for 14 days. 10/06/20 10/20/20  Carole Civil, MD   polyethylene glycol (MIRALAX / GLYCOLAX) 17 g packet Take 17 g by mouth daily. Patient not taking: Reported on 01/11/2021 04/23/20   Lavina Hamman, MD    Allergies as of 01/11/2021   (No Known Allergies)    Family History  Problem Relation Age of Onset   Stroke Mother 82   Heart disease Father 69       MI   Diabetes Maternal Uncle    Colon cancer Neg Hx    Cancer Neg Hx     Social History   Socioeconomic History   Marital status: Widowed    Spouse name: Not on file   Number of children: 2   Years of education: 82   Highest education level: Not on file  Occupational History   Occupation: retired    Fish farm manager: [employer]  Tobacco Use   Smoking status: Former    Packs/day: 0.00    Years: 62.00    Pack years: 0.00  Types: Cigarettes    Quit date: 02/28/2019    Years since quitting: 1.8   Smokeless tobacco: Never   Tobacco comments:    1/2 ppd > 60 years as of 06/27/17: 4-5 cigarettes or less a day  Vaping Use   Vaping Use: Never used  Substance and Sexual Activity   Alcohol use: Not Currently    Alcohol/week: 2.0 standard drinks    Types: 2 Cans of beer per week   Drug use: No   Sexual activity: Yes    Birth control/protection: None  Other Topics Concern   Not on file  Social History Narrative   Right handed   Drinks caffeine   Assistant living   Social Determinants of Health   Financial Resource Strain: Not on file  Food Insecurity: Not on file  Transportation Needs: Not on file  Physical Activity: Not on file  Stress: Not on file  Social Connections: Not on file  Intimate Partner Violence: Not on file    Review of Systems: See HPI, otherwise negative ROS  Physical Exam: BP (!) 171/59   Pulse 66   Temp (!) 96.9 F (36.1 C) (Temporal)   Ht _0  (1.803 m)   Wt 149 lb 3.2 oz (67.7 kg)   BMI 20.81 kg/m  General:   Alert, conversant pleasant and cooperative in NAD; accompanied by Glenard Haring, his caregiver. Neck:  Supple; no masses or  thyromegaly. No significant cervical adenopathy. Lungs:  Clear throughout to auscultation.   No wheezes, crackles, or rhonchi. No acute distress. Heart:  Regular rate and rhythm; no murmurs, clicks, rubs,  or gallops. Abdomen: Non-distended, normal bowel sounds.  Soft and nontender without appreciable mass or hepatosplenomegaly.  Pulses:  Normal pulses noted. Extremities:  Without clubbing or edema. Rectal: No external lesions.  Moderately good sphincter tone.  He does have some soft brown stool in rectal vault.  No mass.  No impaction.  Stool is Hemoccult negative.   Impression/Plan: 80 year old gentleman with a history of cirrhosis and colonic polyps, nursing home resident, referred for self-limiting episode of diarrhea punctuated by rectal bleeding.  Symptoms have resolved.  He attributes symptom of medication which was started and then stopped.  Potential agent unknown to me. He is Hemoccult negative today.  No impaction  His cirrhosis remains well compensated.  He is due for both a surveillance colonoscopy and a screening EGD next year.    Recommendations:  Because of a history of polyps, I do recommend 1 more colonoscopy.  In addition, we should check his esophagus 1 more time to assess for  varices.  To these ends, I recommend both a diagnostic colonoscopy and a screening EGD (ASA 3/propofol. He will need a good colon preparation.  It is very important to follow prep instructions closely to be cleaned out adequately to complete the examination. The risks, benefits, limitations, imponderables and alternatives regarding both EGD and colonoscopy have been reviewed with the patient. Questions have been answered.  He is agreeable.     Notice: This dictation was prepared with Dragon dictation along with smaller phrase technology. Any transcriptional errors that result from this process are unintentional and may not be corrected upon review.

## 2021-01-11 NOTE — Patient Instructions (Addendum)
It was good to see you again today!  I am happy to report there is no blood in your stool today.  Because you did have bleeding and you have a history of polyps I do recommend you have 1 more colonoscopy.  In addition, because you have cirrhosis of the liver, we need to check your esophagus 1 more time to assess for varicose veins.  To these ends, I recommend both a diagnostic colonoscopy and a screening EGD (ASA 3/propofol. You will need a good colon preparation.  It is very important to follow your prep instructions closely to be cleaned out adequately to complete the examination.  Okay to continue probiotic.                                              We will plan to get these procedures done the first of the year open (January/February)

## 2021-01-20 DIAGNOSIS — R531 Weakness: Secondary | ICD-10-CM | POA: Diagnosis not present

## 2021-01-20 DIAGNOSIS — J439 Emphysema, unspecified: Secondary | ICD-10-CM | POA: Diagnosis not present

## 2021-01-20 DIAGNOSIS — C3490 Malignant neoplasm of unspecified part of unspecified bronchus or lung: Secondary | ICD-10-CM | POA: Diagnosis not present

## 2021-01-20 DIAGNOSIS — J449 Chronic obstructive pulmonary disease, unspecified: Secondary | ICD-10-CM | POA: Diagnosis not present

## 2021-02-05 DIAGNOSIS — J449 Chronic obstructive pulmonary disease, unspecified: Secondary | ICD-10-CM | POA: Diagnosis not present

## 2021-02-09 NOTE — Progress Notes (Signed)
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 Chemoradiation with carboplatin and paclitaxel weekly from 06/11/2017 to 07/16/2017. 2. Consolidation with durvalumab from 09/19/2017 to 09/17/2018.  NGS Results: not done  CURRENT THERAPY: surveillance  BRIEF ONCOLOGIC HISTORY:  Oncology History  Carcinoma, lung ([place] II)  06/05/2017 Initial Diagnosis   Carcinoma, lung ([place])   06/11/2017 - 07/16/2017 Chemotherapy   The patient had dexamethasone (DECADRON) 4 MG tablet, 8 mg, Oral, Daily, 1 of 1 cycle, Start date: --, End date: -- palonosetron (ALOXI) injection 0.25 mg, 0.25 mg, Intravenous,  Once, 6 of 6 cycles Administration: 0.25 mg (06/11/2017), 0.25 mg (07/09/2017), 0.25 mg (07/16/2017), 0.25 mg (06/18/2017), 0.25 mg (06/25/2017), 0.25 mg (07/02/2017) CARBOplatin (PARAPLATIN) 190 mg in sodium chloride 0.9 % 250 mL chemo infusion, 190 mg (100 % of original dose 192.8 mg), Intravenous,  Once, 6 of 6 cycles Dose modification:   (original dose 192.8 mg, Cycle 1),   (original dose 192.8 mg, Cycle 5), 144.6 mg (original dose 144.6 mg, Cycle 6),   (original dose 192.8 mg, Cycle 2),   (original dose 192.8 mg, Cycle 3),   (original dose 192.8 mg, Cycle 4) Administration: 190 mg (06/11/2017), 190 mg (07/09/2017), 140 mg (07/16/2017), 190 mg (06/18/2017), 190 mg (06/25/2017), 190 mg (07/02/2017) PACLitaxel (TAXOL) 90 mg in dextrose 5 % 250 mL chemo infusion (</= 47m/m2), 45 mg/m2 = 90 mg, Intravenous,  Once, 6 of 6 cycles Dose modification: 40.5 mg/m2 (90 % of original dose 45 mg/m2, Cycle 5, Reason: Other (see comments), Comment: early neuropathy), 36 mg/m2 (80 % of original dose 45 mg/m2, Cycle 6, Reason: Provider Judgment) Administration: 90 mg (06/11/2017), 78 mg  (07/09/2017), 72 mg (07/16/2017), 90 mg (06/18/2017), 78 mg (06/25/2017), 78 mg (07/02/2017)   for chemotherapy treatment.     Malignant neoplasm of bronchus and lung ([facility]  06/08/2017 Initial Diagnosis   Malignant neoplasm of bronchus and lung ([facility]   09/20/2017 -  Chemotherapy   The patient had durvalumab (IMFINZI) 860 mg in sodium chloride 0.9 % 100 mL chemo infusion, 800 mg, Intravenous,  Once, 26 of 26 cycles Administration: 860 mg (09/20/2017), 860 mg (10/04/2017), 860 mg (10/18/2017), 860 mg (11/01/2017), 860 mg (11/15/2017), 860 mg (11/29/2017), 860 mg (12/13/2017), 860 mg (12/27/2017), 860 mg (01/10/2018), 860 mg (01/28/2018), 860 mg (02/11/2018), 860 mg (02/25/2018), 860 mg (03/11/2018), 860 mg (03/25/2018), 860 mg (04/08/2018), 860 mg (04/22/2018), 860 mg (05/08/2018), 860 mg (05/27/2018), 860 mg (06/10/2018), 860 mg (06/25/2018), 860 mg (07/09/2018), 860 mg (07/23/2018), 860 mg (08/06/2018), 860 mg (08/20/2018), 860 mg (09/03/2018), 860 mg (09/17/2018)   for chemotherapy treatment.       CANCER STAGING:  Cancer Staging  No matching staging information was found for the patient.  INTERVAL HISTORY:  Ian Aguilar a 80y.o. male, returns for routine follow-up of his stage II right upper lobe adenocarcinoma. Ian Aguilar last seen on 01/03/2021.  Today he reports feeling good. Following CVA he reports decreased balance, and he is walking with the assistance of a walker. His appetite is good, and his BM are regular. He reports he has been taking Synthroid after breakfast.   REVIEW OF SYSTEMS:  Review of Systems  Constitutional:  Negative for appetite change and fatigue.  Respiratory:  Positive for shortness  of breath.   Gastrointestinal:  Negative for constipation and diarrhea.  Musculoskeletal:  Positive for gait problem (off balance).  Neurological:  Positive for gait problem (off balance).  All other systems reviewed and are negative.  PAST MEDICAL/SURGICAL HISTORY:  Past Medical History:   Diagnosis Date   Anxiety    Arthritis    Cancer ([place])    Cirrhosis ([place])    confirmed by MRI on 07/04/11.     COPD (chronic obstructive pulmonary disease) ([place])    Depression with anxiety    ETOH abuse    Hyperlipidemia    Hypertension    diet control   Nodule of upper lobe of right lung    with mediastinal adenopathy   Wears dentures    Wears glasses    Past Surgical History:  Procedure Laterality Date   bilateral cataract surgery     [place]   broken arm  6 yrs ago   left otif of wrist-[place]   CLOSED REDUCTION WRIST FRACTURE Right 09/02/2015   Procedure: RIGHT WRIST REDUCTION;  Surgeon: Ian Butters, MD;  Location: [facility];  Service: Orthopedics;  Laterality: Right;  with MAC   COLONOSCOPY  1996   Ian Aguilar: external hemorrhoids, no polyps   COLONOSCOPY  06/28/2011   Ian Aguilar diverticulosis,tubular adenoma, hyperplastic polyp   COLONOSCOPY WITH PROPOFOL N/A 04/26/2017   Dr. Gala Aguilar: perianal and digital rectal examinations normal, diffusely congested colonic mucosa but otherwise normal   ESOPHAGOGASTRODUODENOSCOPY  06/28/2011   Dr. Chelsea Aguilar erosive reflux esophagitis, hiatal hernia-gastritis   ESOPHAGOGASTRODUODENOSCOPY (EGD) WITH PROPOFOL N/A 04/26/2017   Dr. Gala Aguilar: normal esophagus, small hiatal hernia, GAVE, portal hypertensive gastropathy, normal duodenal bulb and second portion, no specimens collected. 2 years screening    ESOPHAGOGASTRODUODENOSCOPY (EGD) WITH PROPOFOL N/A 06/19/2019   normal esophagus, portal gastropathy, GAVE, normal duodenum. 2 years screening.    EXTERNAL EAR SURGERY     right ear-cleaned out ear and created new eardrum   INGUINAL HERNIA REPAIR  2-3 yrs ago   left-Ian Aguilar   INTRAMEDULLARY (IM) NAIL INTERTROCHANTERIC Right 09/02/2015   Procedure: RIGHT  INTERTROCHANTRIC HIP;  Surgeon: Ian Butters, MD;  Location: [facility];  Service: Orthopedics;  Laterality: Right;  With MAC   KNEE SURGERY     left-arthroscopy-[place]   PORTACATH  PLACEMENT Left 06/08/2017   Procedure: INSERTION POWER PORT WITH  ATTACHED CATHETER LEFT SUBCLAVIAN;  Surgeon: Ian Signs, MD;  Location: [facility];  Service: General;  Laterality: Left;   SKULL FRACTURE ELEVATION     fractured cheeck bone repaired   TOTAL HIP ARTHROPLASTY  12/18/2011   Procedure: TOTAL HIP ARTHROPLASTY;  Surgeon: Carole Civil, MD;  Location: [facility];  Service: Orthopedics;  Laterality: Left;   VIDEO BRONCHOSCOPY WITH ENDOBRONCHIAL ULTRASOUND N/A 05/04/2017   Procedure: VIDEO BRONCHOSCOPY WITH ENDOBRONCHIAL ULTRASOUND;  Surgeon: Melrose Nakayama, MD;  Location: [facility];  Service: Thoracic;  Laterality: N/A;   WISDOM TOOTH EXTRACTION      SOCIAL HISTORY:  Social History   Socioeconomic History   Marital status: Widowed    Spouse name: Not on file   Number of children: 2   Years of education: 48   Highest education level: Not on file  Occupational History   Occupation: retired    Fish farm manager: [employer]  Tobacco Use   Smoking status: Former    Packs/day: 0.00    Years: 62.00    Pack years: 0.00    Types: Cigarettes    Quit date: 02/28/2019  Years since quitting: 1.9   Smokeless tobacco: Never   Tobacco comments:    1/2 ppd > 60 years as of 06/27/17: 4-5 cigarettes or less a day  Vaping Use   Vaping Use: Never used  Substance and Sexual Activity   Alcohol use: Not Currently    Alcohol/week: 2.0 standard drinks    Types: 2 Cans of beer per week   Drug use: No   Sexual activity: Yes    Birth control/protection: None  Other Topics Concern   Not on file  Social History Narrative   Right handed   Drinks caffeine   Assistant living   Social Determinants of Health   Financial Resource Strain: Not on file  Food Insecurity: Not on file  Transportation Needs: Not on file  Physical Activity: Not on file  Stress: Not on file  Social Connections: Not on file  Intimate Partner Violence: Not on file    FAMILY HISTORY:  Family History   Problem Relation Age of Onset   Stroke Mother 50   Heart disease Father 4       MI   Diabetes Maternal Uncle    Colon cancer Neg Hx    Cancer Neg Hx     CURRENT MEDICATIONS:  Current Outpatient Medications  Medication Sig Dispense Refill   cholecalciferol (VITAMIN D) 1000 units tablet Take 1,000 Units by mouth daily.     docusate sodium (COLACE) 100 MG capsule Take 1 capsule (100 mg total) by mouth 2 (two) times daily. 60 capsule 2   donepezil (ARICEPT) 10 MG tablet Take one tablet, 10 mg daily 30 tablet 11   feeding supplement (ENSURE ENLIVE / ENSURE PLUS) LIQD Take 237 mLs by mouth 2 (two) times daily between meals.     GOODSENSE ARTIFICIAL TEARS 0.5-0.6 % SOLN Apply to eye.     guaiFENesin (MUCINEX) 600 MG 12 hr tablet Take 600 mg by mouth 2 (two) times daily as needed for cough (congestion).     Lactobacillus-Inulin (CULTURELLE DIGESTIVE DAILY PO) Take by mouth daily.     levothyroxine (SYNTHROID) 75 MCG tablet Take 1 tablet (75 mcg total) by mouth daily before breakfast. 30 tablet 3   NON FORMULARY Diet NAS     OXYGEN Inhale 2 L into the lungs continuous.     polyethylene glycol (MIRALAX / GLYCOLAX) 17 g packet Take 17 g by mouth daily. 14 each 0   Psyllium (METAMUCIL PO) Take by mouth. 1 capsule twice daily     rosuvastatin (CRESTOR) 10 MG tablet Take 1 tablet (10 mg total) by mouth daily. 30 tablet 0   SPIRIVA RESPIMAT 2.5 MCG/ACT AERS Inhale 2 puffs into the lungs daily.     triamcinolone (KENALOG) 0.025 % ointment Apply 1 application topically 2 (two) times daily. Apply to face and arms sparingly for dryness     vitamin B-12 (CYANOCOBALAMIN) 1000 MCG tablet Take 1 tablet (1,000 mcg total) by mouth daily. 30 tablet 6   gabapentin (NEURONTIN) 100 MG capsule Take 1 capsule (100 mg total) by mouth 3 (three) times daily for 14 days. 42 capsule 0   No current facility-administered medications for this visit.    ALLERGIES:  No Known Allergies  PHYSICAL EXAM:  Performance  status (ECOG): 1 - Symptomatic but completely ambulatory  Vitals:   02/10/21 1029  BP: (!) 169/72  Pulse: (!) 54  Resp: 19  Temp: (!) 97 F (36.1 C)  SpO2: 94%   Wt Readings from Last 3 Encounters:  02/10/21  151 lb 10.8 oz (68.8 kg)  01/11/21 149 lb 3.2 oz (67.7 kg)  01/03/21 150 lb 3.2 oz (68.1 kg)   Physical Exam Vitals reviewed.  Constitutional:      Appearance: Normal appearance.     Interventions: Nasal cannula in place.     Comments: In wheelchair  Cardiovascular:     Rate and Rhythm: Normal rate and regular rhythm.     Pulses: Normal pulses.     Heart sounds: Normal heart sounds.  Pulmonary:     Effort: Pulmonary effort is normal.     Breath sounds: Normal breath sounds.  Neurological:     General: No focal deficit present.     Mental Status: He is alert and oriented to person, place, and time.  Psychiatric:        Mood and Affect: Mood normal.        Behavior: Behavior normal.     LABORATORY DATA:  I have reviewed the labs as listed.  CBC Latest Ref Rng & Units 12/29/2020 10/27/2020 07/19/2020  WBC 4.0 - 10.5 K/uL 4.1 4.9 5.1  Hemoglobin 13.0 - 17.0 g/dL 10.9(L) 12.9(L) 14.1  Hematocrit 39.0 - 52.0 % 34.5(L) 40.8 45.5  Platelets 150 - 400 K/uL 132(L) 171 229   CMP Latest Ref Rng & Units 12/29/2020 10/27/2020 07/19/2020  Glucose 70 - 99 mg/dL 96 91 126(H)  BUN 8 - 23 mg/dL 21 25(H) 16  Creatinine 0.61 - 1.24 mg/dL 0.74 0.89 0.80  Sodium 135 - 145 mmol/L 140 142 136  Potassium 3.5 - 5.1 mmol/L 3.9 4.2 4.5  Chloride 98 - 111 mmol/L 99 98 91(L)  CO2 22 - 32 mmol/L 35(H) 38(H) 37(H)  Calcium 8.9 - 10.3 mg/dL 8.9 9.0 9.2  Total Protein 6.5 - 8.1 g/dL 6.5 - 7.4  Total Bilirubin 0.3 - 1.2 mg/dL 0.5 - 0.6  Alkaline Phos 38 - 126 U/L 74 - 90  AST 15 - 41 U/L 18 - 22  ALT 0 - 44 U/L 13 - 16    DIAGNOSTIC IMAGING:  I have independently reviewed the scans and discussed with the patient. No results found.   ASSESSMENT:  1.  Stage II (T1BN2) right upper lobe  adenocarcinoma: -Chemoradiation therapy with carboplatin and paclitaxel weekly from 06/11/2017 through 07/17/2018. -Durvalumab consolidation from 09/19/2017 through 09/17/2018. -Denies any cough or hemoptysis. -I reviewed CT chest with contrast from 06/16/2019 which showed 9 mm nodule in the medial right upper lobe unchanged.  Adjacent radiation changes.  No evidence of progression or metastatic disease. -CT chest on 12/16/2019 showed stable radiation changes and small nodule in the medial right upper lobe with no Aguilar of recurrence.  New bandlike area of septal thickening and micro nodularity in the right lateral and left anterior upper chest, most suggestive of sequela of infection/inflammation.  Aguilar of hepatic cirrhosis.    PLAN:  1.  Stage II (T1BN2) right upper lobe adenocarcinoma: - He is currently residing at [facility] assisted living facility after stroke. - CT chest with contrast from 12/30/2020 showed unchanged subpleural nodule of the posterior right pulmonary apex measuring 0.9 cm.  Interval enlargement of a well-circumscribed rounded groundglass opacity in the central left upper lobe measuring 1.9 x 1.6 cm.  PET scan would not be very helpful as this is a groundglass lesion.  Biopsy is also risky due to chance of pneumothorax. - I have recommended repeating CT scan in 6 months.   2.  Neuropathy: - On and off numbness in the extremities  is stable.  No neuropathic pains.   3.  Hypothyroidism: - He is taking Synthroid 150 mcg daily.  He is not taking it on empty stomach. - TSH is very low at 0.01. - Recommend decreasing Synthroid to 75 mcg daily on an empty stomach. - Plan to repeat TSH in 2 months.  4.  Normocytic anemia: - He recently had a rectal bleed and drop in hemoglobin. - I reviewed labs from 01/03/2021 which showed ferritin 25 and percent saturation 23.  Folic acid and copper were normal.  B12 was 306 with elevated methylmalonic acid.  SPEP is negative. - Recommend Feraheme  weekly x2.  We discussed side effects in detail. - Recommend B12 1 mg injection today followed by 1 mg tablet daily.  We have sent a prescription to his nursing home pharmacy. - RTC 2 months with repeat ferritin and iron panel.   Orders placed this encounter:  No orders of the defined types were placed in this encounter.    Derek Jack, MD [place] [phone]   I, Thana Ates, am acting as a scribe for Dr. Derek Jack.  I, Derek Jack MD, have reviewed the above documentation for accuracy and completeness, and I agree with the above.

## 2021-02-10 ENCOUNTER — Inpatient Hospital Stay (HOSPITAL_COMMUNITY): Payer: PPO

## 2021-02-10 ENCOUNTER — Other Ambulatory Visit: Payer: Self-pay

## 2021-02-10 ENCOUNTER — Inpatient Hospital Stay (HOSPITAL_COMMUNITY): Payer: PPO | Attending: Hematology | Admitting: Hematology

## 2021-02-10 VITALS — BP 169/72 | HR 54 | Temp 97.0°F | Resp 19 | Ht 71.0 in | Wt 151.7 lb

## 2021-02-10 DIAGNOSIS — D509 Iron deficiency anemia, unspecified: Secondary | ICD-10-CM | POA: Diagnosis not present

## 2021-02-10 DIAGNOSIS — C3411 Malignant neoplasm of upper lobe, right bronchus or lung: Secondary | ICD-10-CM | POA: Insufficient documentation

## 2021-02-10 DIAGNOSIS — C3491 Malignant neoplasm of unspecified part of right bronchus or lung: Secondary | ICD-10-CM

## 2021-02-10 DIAGNOSIS — E538 Deficiency of other specified B group vitamins: Secondary | ICD-10-CM

## 2021-02-10 MED ORDER — CYANOCOBALAMIN 1000 MCG/ML IJ SOLN
1000.0000 ug | Freq: Once | INTRAMUSCULAR | Status: AC
  Administered 2021-02-10: 1000 ug via INTRAMUSCULAR
  Filled 2021-02-10: qty 1

## 2021-02-10 MED ORDER — LEVOTHYROXINE SODIUM 75 MCG PO TABS: 75.0000 ug | ORAL_TABLET | Freq: Every day | ORAL | 3 refills | Status: DC

## 2021-02-10 MED ORDER — VITAMIN B-12 1000 MCG PO TABS
1000.0000 ug | ORAL_TABLET | Freq: Every day | ORAL | 6 refills | Status: DC
Start: 2021-02-10 — End: 2023-12-04

## 2021-02-10 NOTE — Patient Instructions (Addendum)
[facility] at [facility] Discharge Instructions   You were seen and examined today by Dr. Delton Coombes.  Your thyroid level was too low, meaning that your thyroid is now overactive.  We will decrease your levothyroxine to 75 mcg daily.  Take this medication 1 hour before breakfast on an empty stomach.   We will give you a B12 injection today and then we will send prescription for you to take B12 by mouth every day.   We will also arrange for you to receive IV iron infusions since your iron was low.   Return as scheduled for infusions, lab work and office visit.    Thank you for choosing [place] [facility] at [facility] to provide your oncology and hematology care.  To afford each patient quality time with our provider, please arrive at least 15 minutes before your scheduled appointment time.   If you have a lab appointment with the [facility] please come in thru the Main Entrance and check in at the main information desk.  You need to re-schedule your appointment should you arrive 10 or more minutes late.  We strive to give you quality time with our providers, and arriving late affects you and other patients whose appointments are after yours.  Also, if you no show three or more times for appointments you may be dismissed from the clinic at the providers discretion.     Again, thank you for choosing [facility].  Our hope is that these requests will decrease the amount of time that you wait before being seen by our physicians.       _____________________________________________________________  Should you have questions after your visit to [facility], please contact our office at [phone] and follow the prompts.  Our office hours are 8:00 a.m. and 4:30 p.m. Monday - Friday.  Please note that voicemails left after 4:00 p.m. may not be returned until the following business day.  We are closed weekends and major holidays.   You do have access to a nurse 24-7, just call the main number to the clinic [phone] and do not press any options, hold on the line and a nurse will answer the phone.    For prescription refill requests, have your pharmacy contact our office and allow 72 hours.    Due to Covid, you will need to wear a mask upon entering the hospital. If you do not have a mask, a mask will be given to you at the Main Entrance upon arrival. For doctor visits, patients may have 1 support person age 79 or older with them. For treatment visits, patients can not have anyone with them due to social distancing guidelines and our immunocompromised population.

## 2021-02-10 NOTE — Patient Instructions (Signed)
[facility]  Discharge Instructions: Thank you for choosing [facility] to provide your oncology and hematology care.  If you have a lab appointment with the Cancer Center, please come in thru the Main Entrance and check in at the main information desk.  Wear comfortable clothing and clothing appropriate for easy access to any Portacath or PICC line.   We strive to give you quality time with your provider. You may need to reschedule your appointment if you arrive late (15 or more minutes).  Arriving late affects you and other patients whose appointments are after yours.  Also, if you miss three or more appointments without notifying the office, you may be dismissed from the clinic at the provider's discretion.      For prescription refill requests, have your pharmacy contact our office and allow 72 hours for refills to be completed.        To help prevent nausea and vomiting after your treatment, we encourage you to take your nausea medication as directed.  BELOW ARE SYMPTOMS THAT SHOULD BE REPORTED IMMEDIATELY: *FEVER GREATER THAN 100.4 F (38 C) OR HIGHER *CHILLS OR SWEATING *NAUSEA AND VOMITING THAT IS NOT CONTROLLED WITH YOUR NAUSEA MEDICATION *UNUSUAL SHORTNESS OF BREATH *UNUSUAL BRUISING OR BLEEDING *URINARY PROBLEMS (pain or burning when urinating, or frequent urination) *BOWEL PROBLEMS (unusual diarrhea, constipation, pain near the anus) TENDERNESS IN MOUTH AND THROAT WITH OR WITHOUT PRESENCE OF ULCERS (sore throat, sores in mouth, or a toothache) UNUSUAL RASH, SWELLING OR PAIN  UNUSUAL VAGINAL DISCHARGE OR ITCHING   Items with * indicate a potential emergency and should be followed up as soon as possible or go to the Emergency Department if any problems should occur.  Please show the CHEMOTHERAPY ALERT CARD or IMMUNOTHERAPY ALERT CARD at check-in to the Emergency Department and triage nurse.  Should you have questions after your visit or need to cancel  or reschedule your appointment, please contact [facility] [phone]  and follow the prompts.  Office hours are 8:00 a.m. to 4:30 p.m. Monday - Friday. Please note that voicemails left after 4:00 p.m. may not be returned until the following business day.  We are closed weekends and major holidays. You have access to a nurse at all times for urgent questions. Please call the main number to the clinic [phone] and follow the prompts.  For any non-urgent questions, you may also contact your provider using MyChart. We now offer e-Visits for anyone 18 and older to request care online for non-urgent symptoms. For details visit [email or web address].   Also download the MyChart app! Go to the app store, search "MyChart", open the app, select [facility], and log in with your MyChart username and password.  Due to Covid, a mask is required upon entering the hospital/clinic. If you do not have a mask, one will be given to you upon arrival. For doctor visits, patients may have 1 support person aged 18 or older with them. For treatment visits, patients cannot have anyone with them due to current Covid guidelines and our immunocompromised population.  

## 2021-02-10 NOTE — Progress Notes (Signed)
Patient tolerated injection with no complaints voiced.  Site clean and dry with no bruising or swelling noted at site.  See MAR for details.  Band aid applied.  Patient stable during and after injection.  Vss with discharge and left in satisfactory condition with no s/s of distress noted.  

## 2021-02-14 DIAGNOSIS — D509 Iron deficiency anemia, unspecified: Secondary | ICD-10-CM | POA: Insufficient documentation

## 2021-02-15 ENCOUNTER — Ambulatory Visit (HOSPITAL_COMMUNITY): Payer: PPO

## 2021-02-15 DIAGNOSIS — M6281 Muscle weakness (generalized): Secondary | ICD-10-CM | POA: Diagnosis not present

## 2021-02-15 DIAGNOSIS — R2689 Other abnormalities of gait and mobility: Secondary | ICD-10-CM | POA: Diagnosis not present

## 2021-02-17 DIAGNOSIS — M6281 Muscle weakness (generalized): Secondary | ICD-10-CM | POA: Diagnosis not present

## 2021-02-17 DIAGNOSIS — R2689 Other abnormalities of gait and mobility: Secondary | ICD-10-CM | POA: Diagnosis not present

## 2021-02-19 DIAGNOSIS — C3490 Malignant neoplasm of unspecified part of unspecified bronchus or lung: Secondary | ICD-10-CM | POA: Diagnosis not present

## 2021-02-19 DIAGNOSIS — J439 Emphysema, unspecified: Secondary | ICD-10-CM | POA: Diagnosis not present

## 2021-02-19 DIAGNOSIS — R531 Weakness: Secondary | ICD-10-CM | POA: Diagnosis not present

## 2021-02-19 DIAGNOSIS — J449 Chronic obstructive pulmonary disease, unspecified: Secondary | ICD-10-CM | POA: Diagnosis not present

## 2021-02-22 ENCOUNTER — Encounter (HOSPITAL_COMMUNITY): Payer: Self-pay

## 2021-02-22 ENCOUNTER — Inpatient Hospital Stay (HOSPITAL_COMMUNITY): Payer: PPO

## 2021-02-22 ENCOUNTER — Other Ambulatory Visit: Payer: Self-pay

## 2021-02-22 VITALS — BP 138/54 | HR 44 | Temp 96.8°F | Resp 18 | Ht 71.0 in | Wt 156.0 lb

## 2021-02-22 DIAGNOSIS — F1021 Alcohol dependence, in remission: Secondary | ICD-10-CM | POA: Diagnosis not present

## 2021-02-22 DIAGNOSIS — E039 Hypothyroidism, unspecified: Secondary | ICD-10-CM | POA: Diagnosis not present

## 2021-02-22 DIAGNOSIS — D692 Other nonthrombocytopenic purpura: Secondary | ICD-10-CM | POA: Diagnosis not present

## 2021-02-22 DIAGNOSIS — M6281 Muscle weakness (generalized): Secondary | ICD-10-CM | POA: Diagnosis not present

## 2021-02-22 DIAGNOSIS — Z79890 Hormone replacement therapy (postmenopausal): Secondary | ICD-10-CM | POA: Diagnosis not present

## 2021-02-22 DIAGNOSIS — D509 Iron deficiency anemia, unspecified: Secondary | ICD-10-CM

## 2021-02-22 DIAGNOSIS — Z9981 Dependence on supplemental oxygen: Secondary | ICD-10-CM | POA: Diagnosis not present

## 2021-02-22 DIAGNOSIS — C3411 Malignant neoplasm of upper lobe, right bronchus or lung: Secondary | ICD-10-CM | POA: Diagnosis not present

## 2021-02-22 DIAGNOSIS — J449 Chronic obstructive pulmonary disease, unspecified: Secondary | ICD-10-CM | POA: Diagnosis not present

## 2021-02-22 DIAGNOSIS — Z515 Encounter for palliative care: Secondary | ICD-10-CM | POA: Diagnosis not present

## 2021-02-22 DIAGNOSIS — I739 Peripheral vascular disease, unspecified: Secondary | ICD-10-CM | POA: Diagnosis not present

## 2021-02-22 DIAGNOSIS — F17210 Nicotine dependence, cigarettes, uncomplicated: Secondary | ICD-10-CM | POA: Diagnosis not present

## 2021-02-22 DIAGNOSIS — R2689 Other abnormalities of gait and mobility: Secondary | ICD-10-CM | POA: Diagnosis not present

## 2021-02-22 DIAGNOSIS — K7030 Alcoholic cirrhosis of liver without ascites: Secondary | ICD-10-CM | POA: Diagnosis not present

## 2021-02-22 MED ORDER — ACETAMINOPHEN 325 MG PO TABS
650.0000 mg | ORAL_TABLET | Freq: Once | ORAL | Status: AC
  Administered 2021-02-22: 14:00:00 650 mg via ORAL
  Filled 2021-02-22: qty 2

## 2021-02-22 MED ORDER — HEPARIN SOD (PORK) LOCK FLUSH 100 UNIT/ML IV SOLN
500.0000 [IU] | Freq: Once | INTRAVENOUS | Status: AC
  Administered 2021-02-22: 16:00:00 500 [IU] via INTRAVENOUS

## 2021-02-22 MED ORDER — SODIUM CHLORIDE 0.9 % IV SOLN
510.0000 mg | Freq: Once | INTRAVENOUS | Status: AC
  Administered 2021-02-22: 15:00:00 510 mg via INTRAVENOUS
  Filled 2021-02-22: qty 17

## 2021-02-22 MED ORDER — LORATADINE 10 MG PO TABS
10.0000 mg | ORAL_TABLET | Freq: Once | ORAL | Status: AC
  Administered 2021-02-22: 14:00:00 10 mg via ORAL
  Filled 2021-02-22: qty 1

## 2021-02-22 MED ORDER — SODIUM CHLORIDE 0.9% FLUSH
10.0000 mL | INTRAVENOUS | Status: DC | PRN
  Administered 2021-02-22: 16:00:00 10 mL via INTRAVENOUS

## 2021-02-22 MED ORDER — SODIUM CHLORIDE 0.9 % IV SOLN: Freq: Once | INTRAVENOUS | Status: AC

## 2021-02-22 NOTE — Progress Notes (Signed)
Pt tolerated feraheme infusion well today without incidence.  AVS reviewed.  Vital signs stable prior to discharge.  Discharged in stable condition via wheelchair. Stable during and after treatment.

## 2021-02-22 NOTE — Patient Instructions (Signed)
[facility]  Discharge Instructions: Thank you for choosing [facility] to provide your oncology and hematology care.  If you have a lab appointment with the [facility], please come in thru the Main Entrance and check in at the main information desk.  Wear comfortable clothing and clothing appropriate for easy access to any Portacath or PICC line.   We strive to give you quality time with your provider. You may need to reschedule your appointment if you arrive late (15 or more minutes).  Arriving late affects you and other patients whose appointments are after yours.  Also, if you miss three or more appointments without notifying the office, you may be dismissed from the clinic at the providers discretion.      For prescription refill requests, have your pharmacy contact our office and allow 72 hours for refills to be completed.    Today you received the following chemotherapy and/or immunotherapy agents feraheme      To help prevent nausea and vomiting after your treatment, we encourage you to take your nausea medication as directed.  BELOW ARE SYMPTOMS THAT SHOULD BE REPORTED IMMEDIATELY: *FEVER GREATER THAN 100.4 F (38 C) OR HIGHER *CHILLS OR SWEATING *NAUSEA AND VOMITING THAT IS NOT CONTROLLED WITH YOUR NAUSEA MEDICATION *UNUSUAL SHORTNESS OF BREATH *UNUSUAL BRUISING OR BLEEDING *URINARY PROBLEMS (pain or burning when urinating, or frequent urination) *BOWEL PROBLEMS (unusual diarrhea, constipation, pain near the anus) TENDERNESS IN MOUTH AND THROAT WITH OR WITHOUT PRESENCE OF ULCERS (sore throat, sores in mouth, or a toothache) UNUSUAL RASH, SWELLING OR PAIN  UNUSUAL VAGINAL DISCHARGE OR ITCHING   Items with * indicate a potential emergency and should be followed up as soon as possible or go to the Emergency Department if any problems should occur.  Please show the CHEMOTHERAPY ALERT CARD or IMMUNOTHERAPY ALERT CARD at check-in to the Emergency  Department and triage nurse.  Should you have questions after your visit or need to cancel or reschedule your appointment, please contact [facility] [phone]  and follow the prompts.  Office hours are 8:00 a.m. to 4:30 p.m. Monday - Friday. Please note that voicemails left after 4:00 p.m. may not be returned until the following business day.  We are closed weekends and major holidays. You have access to a nurse at all times for urgent questions. Please call the main number to the clinic [phone] and follow the prompts.  For any non-urgent questions, you may also contact your provider using MyChart. We now offer e-Visits for anyone 58 and older to request care online for non-urgent symptoms. For details visit [email or web address].   Also download the MyChart app! Go to the app store, search "MyChart", open the app, select [facility], and log in with your MyChart username and password.  Due to Covid, a mask is required upon entering the hospital/clinic. If you do not have a mask, one will be given to you upon arrival. For doctor visits, patients may have 1 support person aged 62 or older with them. For treatment visits, patients cannot have anyone with them due to current Covid guidelines and our immunocompromised population.

## 2021-02-24 DIAGNOSIS — R2689 Other abnormalities of gait and mobility: Secondary | ICD-10-CM | POA: Diagnosis not present

## 2021-02-24 DIAGNOSIS — M6281 Muscle weakness (generalized): Secondary | ICD-10-CM | POA: Diagnosis not present

## 2021-02-28 DIAGNOSIS — M6281 Muscle weakness (generalized): Secondary | ICD-10-CM | POA: Diagnosis not present

## 2021-02-28 DIAGNOSIS — R2689 Other abnormalities of gait and mobility: Secondary | ICD-10-CM | POA: Diagnosis not present

## 2021-03-01 ENCOUNTER — Ambulatory Visit (HOSPITAL_COMMUNITY): Payer: PPO

## 2021-03-02 DIAGNOSIS — M6281 Muscle weakness (generalized): Secondary | ICD-10-CM | POA: Diagnosis not present

## 2021-03-02 DIAGNOSIS — R2689 Other abnormalities of gait and mobility: Secondary | ICD-10-CM | POA: Diagnosis not present

## 2021-03-03 ENCOUNTER — Inpatient Hospital Stay (HOSPITAL_COMMUNITY): Payer: PPO | Attending: Hematology

## 2021-03-03 ENCOUNTER — Encounter (HOSPITAL_COMMUNITY): Payer: Self-pay

## 2021-03-03 ENCOUNTER — Other Ambulatory Visit: Payer: Self-pay

## 2021-03-03 VITALS — BP 160/58 | HR 47 | Temp 98.0°F | Resp 18

## 2021-03-03 DIAGNOSIS — D509 Iron deficiency anemia, unspecified: Secondary | ICD-10-CM | POA: Insufficient documentation

## 2021-03-03 DIAGNOSIS — C3411 Malignant neoplasm of upper lobe, right bronchus or lung: Secondary | ICD-10-CM | POA: Diagnosis not present

## 2021-03-03 DIAGNOSIS — Z95828 Presence of other vascular implants and grafts: Secondary | ICD-10-CM

## 2021-03-03 MED ORDER — HEPARIN SOD (PORK) LOCK FLUSH 100 UNIT/ML IV SOLN
500.0000 [IU] | Freq: Once | INTRAVENOUS | Status: AC
  Administered 2021-03-03: 500 [IU] via INTRAVENOUS

## 2021-03-03 MED ORDER — SODIUM CHLORIDE 0.9 % IV SOLN
510.0000 mg | Freq: Once | INTRAVENOUS | Status: AC
  Administered 2021-03-03: 510 mg via INTRAVENOUS
  Filled 2021-03-03: qty 510

## 2021-03-03 MED ORDER — LORATADINE 10 MG PO TABS
10.0000 mg | ORAL_TABLET | Freq: Once | ORAL | Status: AC
  Administered 2021-03-03: 10 mg via ORAL
  Filled 2021-03-03: qty 1

## 2021-03-03 MED ORDER — ACETAMINOPHEN 325 MG PO TABS
650.0000 mg | ORAL_TABLET | Freq: Once | ORAL | Status: AC
  Administered 2021-03-03: 650 mg via ORAL
  Filled 2021-03-03: qty 2

## 2021-03-03 MED ORDER — SODIUM CHLORIDE 0.9 % IV SOLN: Freq: Once | INTRAVENOUS | Status: AC

## 2021-03-03 MED ORDER — SODIUM CHLORIDE 0.9% FLUSH
10.0000 mL | Freq: Once | INTRAVENOUS | Status: AC
  Administered 2021-03-03: 10 mL via INTRAVENOUS

## 2021-03-03 NOTE — Progress Notes (Signed)
Patient tolerated iron with no complaints voiced. Side effects with management reviewed understanding verbalized. Port site clean and dry with no bruising or swelling noted at site. Good blood return noted before and after administration of chemotherapy. Band aid applied. Patient left in satisfactory condition with VSS and no s/s of distress noted.

## 2021-03-03 NOTE — Patient Instructions (Signed)
[facility]  Discharge Instructions: ?Thank you for choosing [facility] to provide your oncology and hematology care.  ?If you have a lab appointment with the Cancer Center, please come in thru the Main Entrance and check in at the main information desk. ? ?Wear comfortable clothing and clothing appropriate for easy access to any Portacath or PICC line.  ? ?We strive to give you quality time with your provider. You may need to reschedule your appointment if you arrive late (15 or more minutes).  Arriving late affects you and other patients whose appointments are after yours.  Also, if you miss three or more appointments without notifying the office, you may be dismissed from the clinic at the provider?s discretion.    ?  ?For prescription refill requests, have your pharmacy contact our office and allow 72 hours for refills to be completed.   ? ?Today you received the following Feraheme, return as scheduled. ?  ?To help prevent nausea and vomiting after your treatment, we encourage you to take your nausea medication as directed. ? ?BELOW ARE SYMPTOMS THAT SHOULD BE REPORTED IMMEDIATELY: ?*FEVER GREATER THAN 100.4 F (38 ?C) OR HIGHER ?*CHILLS OR SWEATING ?*NAUSEA AND VOMITING THAT IS NOT CONTROLLED WITH YOUR NAUSEA MEDICATION ?*UNUSUAL SHORTNESS OF BREATH ?*UNUSUAL BRUISING OR BLEEDING ?*URINARY PROBLEMS (pain or burning when urinating, or frequent urination) ?*BOWEL PROBLEMS (unusual diarrhea, constipation, pain near the anus) ?TENDERNESS IN MOUTH AND THROAT WITH OR WITHOUT PRESENCE OF ULCERS (sore throat, sores in mouth, or a toothache) ?UNUSUAL RASH, SWELLING OR PAIN  ?UNUSUAL VAGINAL DISCHARGE OR ITCHING  ? ?Items with * indicate a potential emergency and should be followed up as soon as possible or go to the Emergency Department if any problems should occur. ? ?Please show the CHEMOTHERAPY ALERT CARD or IMMUNOTHERAPY ALERT CARD at check-in to the Emergency Department and triage  nurse. ? ?Should you have questions after your visit or need to cancel or reschedule your appointment, please contact [facility] [phone]  and follow the prompts.  Office hours are 8:00 a.m. to 4:30 p.m. Monday - Friday. Please note that voicemails left after 4:00 p.m. may not be returned until the following business day.  We are closed weekends and major holidays. You have access to a nurse at all times for urgent questions. Please call the main number to the clinic [phone] and follow the prompts. ? ?For any non-urgent questions, you may also contact your provider using MyChart. We now offer e-Visits for anyone 18 and older to request care online for non-urgent symptoms. For details visit [email or web address]. ?  ?Also download the MyChart app! Go to the app store, search "MyChart", open the app, select , and log in with your MyChart username and password. ? ?Due to Covid, a mask is required upon entering the hospital/clinic. If you do not have a mask, one will be given to you upon arrival. For doctor visits, patients may have 1 support person aged 18 or older with them. For treatment visits, patients cannot have anyone with them due to current Covid guidelines and our immunocompromised population.  ?

## 2021-03-07 DIAGNOSIS — M6281 Muscle weakness (generalized): Secondary | ICD-10-CM | POA: Diagnosis not present

## 2021-03-07 DIAGNOSIS — R2689 Other abnormalities of gait and mobility: Secondary | ICD-10-CM | POA: Diagnosis not present

## 2021-03-08 DIAGNOSIS — J449 Chronic obstructive pulmonary disease, unspecified: Secondary | ICD-10-CM | POA: Diagnosis not present

## 2021-03-09 DIAGNOSIS — M6281 Muscle weakness (generalized): Secondary | ICD-10-CM | POA: Diagnosis not present

## 2021-03-09 DIAGNOSIS — R2689 Other abnormalities of gait and mobility: Secondary | ICD-10-CM | POA: Diagnosis not present

## 2021-03-10 DIAGNOSIS — H903 Sensorineural hearing loss, bilateral: Secondary | ICD-10-CM | POA: Diagnosis not present

## 2021-03-15 DIAGNOSIS — M6281 Muscle weakness (generalized): Secondary | ICD-10-CM | POA: Diagnosis not present

## 2021-03-15 DIAGNOSIS — R2689 Other abnormalities of gait and mobility: Secondary | ICD-10-CM | POA: Diagnosis not present

## 2021-03-16 ENCOUNTER — Ambulatory Visit: Payer: PPO | Admitting: Gastroenterology

## 2021-03-17 DIAGNOSIS — M6281 Muscle weakness (generalized): Secondary | ICD-10-CM | POA: Diagnosis not present

## 2021-03-17 DIAGNOSIS — R2689 Other abnormalities of gait and mobility: Secondary | ICD-10-CM | POA: Diagnosis not present

## 2021-03-22 DIAGNOSIS — J439 Emphysema, unspecified: Secondary | ICD-10-CM | POA: Diagnosis not present

## 2021-03-22 DIAGNOSIS — R2689 Other abnormalities of gait and mobility: Secondary | ICD-10-CM | POA: Diagnosis not present

## 2021-03-22 DIAGNOSIS — R531 Weakness: Secondary | ICD-10-CM | POA: Diagnosis not present

## 2021-03-22 DIAGNOSIS — M6281 Muscle weakness (generalized): Secondary | ICD-10-CM | POA: Diagnosis not present

## 2021-03-22 DIAGNOSIS — J449 Chronic obstructive pulmonary disease, unspecified: Secondary | ICD-10-CM | POA: Diagnosis not present

## 2021-03-22 DIAGNOSIS — C3490 Malignant neoplasm of unspecified part of unspecified bronchus or lung: Secondary | ICD-10-CM | POA: Diagnosis not present

## 2021-03-24 DIAGNOSIS — M6281 Muscle weakness (generalized): Secondary | ICD-10-CM | POA: Diagnosis not present

## 2021-03-24 DIAGNOSIS — R2689 Other abnormalities of gait and mobility: Secondary | ICD-10-CM | POA: Diagnosis not present

## 2021-03-28 DIAGNOSIS — M6281 Muscle weakness (generalized): Secondary | ICD-10-CM | POA: Diagnosis not present

## 2021-03-28 DIAGNOSIS — R2689 Other abnormalities of gait and mobility: Secondary | ICD-10-CM | POA: Diagnosis not present

## 2021-03-30 DIAGNOSIS — M6281 Muscle weakness (generalized): Secondary | ICD-10-CM | POA: Diagnosis not present

## 2021-03-30 DIAGNOSIS — R2689 Other abnormalities of gait and mobility: Secondary | ICD-10-CM | POA: Diagnosis not present

## 2021-04-08 DIAGNOSIS — J449 Chronic obstructive pulmonary disease, unspecified: Secondary | ICD-10-CM | POA: Diagnosis not present

## 2021-04-11 DIAGNOSIS — Z9221 Personal history of antineoplastic chemotherapy: Secondary | ICD-10-CM | POA: Diagnosis not present

## 2021-04-11 DIAGNOSIS — Z923 Personal history of irradiation: Secondary | ICD-10-CM | POA: Diagnosis not present

## 2021-04-11 DIAGNOSIS — Z85118 Personal history of other malignant neoplasm of bronchus and lung: Secondary | ICD-10-CM | POA: Diagnosis not present

## 2021-04-11 DIAGNOSIS — Z515 Encounter for palliative care: Secondary | ICD-10-CM | POA: Diagnosis not present

## 2021-04-11 DIAGNOSIS — R2689 Other abnormalities of gait and mobility: Secondary | ICD-10-CM | POA: Diagnosis not present

## 2021-04-11 DIAGNOSIS — J449 Chronic obstructive pulmonary disease, unspecified: Secondary | ICD-10-CM | POA: Diagnosis not present

## 2021-04-11 DIAGNOSIS — M6281 Muscle weakness (generalized): Secondary | ICD-10-CM | POA: Diagnosis not present

## 2021-04-11 DIAGNOSIS — F17210 Nicotine dependence, cigarettes, uncomplicated: Secondary | ICD-10-CM | POA: Diagnosis not present

## 2021-04-12 ENCOUNTER — Ambulatory Visit: Payer: PPO | Admitting: Gastroenterology

## 2021-04-13 ENCOUNTER — Inpatient Hospital Stay (HOSPITAL_COMMUNITY): Payer: PPO | Attending: Hematology

## 2021-04-13 DIAGNOSIS — D509 Iron deficiency anemia, unspecified: Secondary | ICD-10-CM

## 2021-04-13 DIAGNOSIS — K7460 Unspecified cirrhosis of liver: Secondary | ICD-10-CM | POA: Diagnosis not present

## 2021-04-13 DIAGNOSIS — Z79899 Other long term (current) drug therapy: Secondary | ICD-10-CM | POA: Diagnosis not present

## 2021-04-13 DIAGNOSIS — R2689 Other abnormalities of gait and mobility: Secondary | ICD-10-CM | POA: Diagnosis not present

## 2021-04-13 DIAGNOSIS — E039 Hypothyroidism, unspecified: Secondary | ICD-10-CM | POA: Insufficient documentation

## 2021-04-13 DIAGNOSIS — G629 Polyneuropathy, unspecified: Secondary | ICD-10-CM | POA: Diagnosis not present

## 2021-04-13 DIAGNOSIS — D649 Anemia, unspecified: Secondary | ICD-10-CM | POA: Diagnosis not present

## 2021-04-13 DIAGNOSIS — M6281 Muscle weakness (generalized): Secondary | ICD-10-CM | POA: Diagnosis not present

## 2021-04-13 DIAGNOSIS — C3411 Malignant neoplasm of upper lobe, right bronchus or lung: Secondary | ICD-10-CM | POA: Insufficient documentation

## 2021-04-13 DIAGNOSIS — Z87891 Personal history of nicotine dependence: Secondary | ICD-10-CM | POA: Diagnosis not present

## 2021-04-13 DIAGNOSIS — E538 Deficiency of other specified B group vitamins: Secondary | ICD-10-CM

## 2021-04-13 DIAGNOSIS — C3491 Malignant neoplasm of unspecified part of right bronchus or lung: Secondary | ICD-10-CM

## 2021-04-13 LAB — IRON AND TIBC
Iron: 100 ug/dL (ref 45–182)
Saturation Ratios: 41 % — ABNORMAL HIGH (ref 17.9–39.5)
TIBC: 242 ug/dL — ABNORMAL LOW (ref 250–450)
UIBC: 142 ug/dL

## 2021-04-13 LAB — COMPREHENSIVE METABOLIC PANEL
ALT: 25 U/L (ref 0–44)
AST: 27 U/L (ref 15–41)
Albumin: 4.2 g/dL (ref 3.5–5.0)
Alkaline Phosphatase: 98 U/L (ref 38–126)
Anion gap: 10 (ref 5–15)
BUN: 15 mg/dL (ref 8–23)
CO2: 28 mmol/L (ref 22–32)
Calcium: 9.1 mg/dL (ref 8.9–10.3)
Chloride: 98 mmol/L (ref 98–111)
Creatinine, Ser: 1.02 mg/dL (ref 0.61–1.24)
GFR, Estimated: >60 mL/min (ref >60)
Glucose, Bld: 80 mg/dL (ref 70–99)
Potassium: 4.3 mmol/L (ref 3.5–5.1)
Sodium: 136 mmol/L (ref 135–145)
Total Bilirubin: 0.6 mg/dL (ref 0.3–1.2)
Total Protein: 6.9 g/dL (ref 6.5–8.1)

## 2021-04-13 LAB — CBC WITH DIFFERENTIAL/PLATELET
Abs Immature Granulocytes: 0 10*3/uL (ref 0.00–0.07)
Basophils Absolute: 0 10*3/uL (ref 0.0–0.1)
Basophils Relative: 0 %
Eosinophils Absolute: 0.1 10*3/uL (ref 0.0–0.5)
Eosinophils Relative: 3 %
HCT: 37 % — ABNORMAL LOW (ref 39.0–52.0)
Hemoglobin: 11.5 g/dL — ABNORMAL LOW (ref 13.0–17.0)
Immature Granulocytes: 0 %
Lymphocytes Relative: 20 %
Lymphs Abs: 0.8 10*3/uL (ref 0.7–4.0)
MCH: 30.3 pg (ref 26.0–34.0)
MCHC: 31.1 g/dL (ref 30.0–36.0)
MCV: 97.6 fL (ref 80.0–100.0)
Monocytes Absolute: 0.4 10*3/uL (ref 0.1–1.0)
Monocytes Relative: 10 %
Neutro Abs: 2.5 10*3/uL (ref 1.7–7.7)
Neutrophils Relative %: 67 %
Platelets: 146 10*3/uL — ABNORMAL LOW (ref 150–400)
RBC: 3.79 MIL/uL — ABNORMAL LOW (ref 4.22–5.81)
RDW: 15.5 % (ref 11.5–15.5)
WBC: 3.8 10*3/uL — ABNORMAL LOW (ref 4.0–10.5)
nRBC: 0 % (ref 0.0–0.2)

## 2021-04-13 LAB — TSH: TSH: 36.851 u[IU]/mL — ABNORMAL HIGH (ref 0.350–4.500)

## 2021-04-13 LAB — VITAMIN B12: Vitamin B-12: 874 pg/mL (ref 180–914)

## 2021-04-13 LAB — FERRITIN: Ferritin: 219 ng/mL (ref 24–336)

## 2021-04-18 DIAGNOSIS — R2689 Other abnormalities of gait and mobility: Secondary | ICD-10-CM | POA: Diagnosis not present

## 2021-04-18 DIAGNOSIS — M6281 Muscle weakness (generalized): Secondary | ICD-10-CM | POA: Diagnosis not present

## 2021-04-20 ENCOUNTER — Inpatient Hospital Stay (HOSPITAL_BASED_OUTPATIENT_CLINIC_OR_DEPARTMENT_OTHER): Payer: PPO | Admitting: Hematology

## 2021-04-20 ENCOUNTER — Other Ambulatory Visit (HOSPITAL_COMMUNITY): Payer: Self-pay | Admitting: *Deleted

## 2021-04-20 ENCOUNTER — Other Ambulatory Visit: Payer: Self-pay

## 2021-04-20 VITALS — BP 161/67 | HR 50 | Temp 96.7°F | Resp 18 | Ht 71.0 in | Wt 164.6 lb

## 2021-04-20 DIAGNOSIS — D509 Iron deficiency anemia, unspecified: Secondary | ICD-10-CM

## 2021-04-20 DIAGNOSIS — M6281 Muscle weakness (generalized): Secondary | ICD-10-CM | POA: Diagnosis not present

## 2021-04-20 DIAGNOSIS — C3491 Malignant neoplasm of unspecified part of right bronchus or lung: Secondary | ICD-10-CM | POA: Diagnosis not present

## 2021-04-20 DIAGNOSIS — C3411 Malignant neoplasm of upper lobe, right bronchus or lung: Secondary | ICD-10-CM | POA: Diagnosis not present

## 2021-04-20 DIAGNOSIS — R2689 Other abnormalities of gait and mobility: Secondary | ICD-10-CM | POA: Diagnosis not present

## 2021-04-20 MED ORDER — LIDOCAINE-PRILOCAINE 2.5-2.5 % EX CREA: 1.0000 "application " | TOPICAL_CREAM | CUTANEOUS | 3 refills | Status: DC | PRN

## 2021-04-20 NOTE — Progress Notes (Signed)
[facility] Ian Aguilar, Ian Aguilar [id number]   CLINIC:  Medical Oncology/Hematology  PCP:  Sharilyn Sites, 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 Chemoradiation with carboplatin and paclitaxel weekly from 06/11/2017 to 07/16/2017. 2. Consolidation with durvalumab from 09/19/2017 to 09/17/2018.  NGS Results: not done  CURRENT THERAPY: surveillance  BRIEF ONCOLOGIC HISTORY:  Oncology History  Carcinoma, lung ([place])  06/05/2017 Initial Diagnosis   Carcinoma, lung ([place])   06/11/2017 - 07/16/2017 Chemotherapy   The patient had dexamethasone (DECADRON) 4 MG tablet, 8 mg, Oral, Daily, 1 of 1 cycle, Start date: --, End date: -- palonosetron (ALOXI) injection 0.25 mg, 0.25 mg, Intravenous,  Once, 6 of 6 cycles Administration: 0.25 mg (06/11/2017), 0.25 mg (07/09/2017), 0.25 mg (07/16/2017), 0.25 mg (06/18/2017), 0.25 mg (06/25/2017), 0.25 mg (07/02/2017) CARBOplatin (PARAPLATIN) 190 mg in sodium chloride 0.9 % 250 mL chemo infusion, 190 mg (100 % of original dose 192.8 mg), Intravenous,  Once, 6 of 6 cycles Dose modification:   (original dose 192.8 mg, Cycle 1),   (original dose 192.8 mg, Cycle 5), 144.6 mg (original dose 144.6 mg, Cycle 6),   (original dose 192.8 mg, Cycle 2),   (original dose 192.8 mg, Cycle 3),   (original dose 192.8 mg, Cycle 4) Administration: 190 mg (06/11/2017), 190 mg (07/09/2017), 140 mg (07/16/2017), 190 mg (06/18/2017), 190 mg (06/25/2017), 190 mg (07/02/2017) PACLitaxel (TAXOL) 90 mg in dextrose 5 % 250 mL chemo infusion (</= 77m/m2), 45 mg/m2 = 90 mg, Intravenous,  Once, 6 of 6 cycles Dose modification: 40.5 mg/m2 (90 % of original dose 45 mg/m2, Cycle 5, Reason: Other (see comments), Comment: early neuropathy), 36 mg/m2 (80 % of original dose 45 mg/m2, Cycle 6, Reason: Provider Judgment) Administration: 90 mg (06/11/2017), 78 mg  (07/09/2017), 72 mg (07/16/2017), 90 mg (06/18/2017), 78 mg (06/25/2017), 78 mg (07/02/2017)   for chemotherapy treatment.     Malignant neoplasm of bronchus and lung ([facility]  06/08/2017 Initial Diagnosis   Malignant neoplasm of bronchus and lung ([facility]   09/20/2017 -  Chemotherapy   The patient had durvalumab (IMFINZI) 860 mg in sodium chloride 0.9 % 100 mL chemo infusion, 800 mg, Intravenous,  Once, 26 of 26 cycles Administration: 860 mg (09/20/2017), 860 mg (10/04/2017), 860 mg (10/18/2017), 860 mg (11/01/2017), 860 mg (11/15/2017), 860 mg (11/29/2017), 860 mg (12/13/2017), 860 mg (12/27/2017), 860 mg (01/10/2018), 860 mg (01/28/2018), 860 mg (02/11/2018), 860 mg (02/25/2018), 860 mg (03/11/2018), 860 mg (03/25/2018), 860 mg (04/08/2018), 860 mg (04/22/2018), 860 mg (05/08/2018), 860 mg (05/27/2018), 860 mg (06/10/2018), 860 mg (06/25/2018), 860 mg (07/09/2018), 860 mg (07/23/2018), 860 mg (08/06/2018), 860 mg (08/20/2018), 860 mg (09/03/2018), 860 mg (09/17/2018)   for chemotherapy treatment.       CANCER STAGING:  Cancer Staging  No matching staging information was found for the patient.  INTERVAL HISTORY:  Ian Aguilar a 81y.o. male, returns for routine follow-up of his stage II right upper lobe adenocarcinoma. Ian Aguilar last seen on 02/10/2021.   Today Ian Aguilar reports feeling good. Ian Aguilar has gained 13 lbs since his last visit. Ian Aguilar reports stable cough. Ian Aguilar denies hemoptysis and recent infections. His strength and energy are improving.   REVIEW OF SYSTEMS:  Review of Systems  Constitutional:  Negative for appetite change, fatigue and unexpected weight change (+13 lbs).  Respiratory:  Positive for cough (stable) and shortness  of breath. Negative for hemoptysis.   All other systems reviewed and are negative.  PAST MEDICAL/SURGICAL HISTORY:  Past Medical History:  Diagnosis Date   Anxiety    Arthritis    Cancer ([place])    Cirrhosis ([place])    confirmed by MRI on 07/04/11.     COPD (chronic obstructive pulmonary  disease) ([place])    Depression with anxiety    ETOH abuse    Hyperlipidemia    Hypertension    diet control   Nodule of upper lobe of right lung    with mediastinal adenopathy   Wears dentures    Wears glasses    Past Surgical History:  Procedure Laterality Date   bilateral cataract surgery     [place]   broken arm  6 yrs ago   left otif of wrist-[place]   CLOSED REDUCTION WRIST FRACTURE Right 09/02/2015   Procedure: RIGHT WRIST REDUCTION;  Surgeon: Renette Butters, MD;  Location: [facility];  Service: Orthopedics;  Laterality: Right;  with MAC   COLONOSCOPY  1996   [facility]: external hemorrhoids, no polyps   COLONOSCOPY  06/28/2011   Dr. Ala Bent diverticulosis,tubular adenoma, hyperplastic polyp   COLONOSCOPY WITH PROPOFOL N/A 04/26/2017   Dr. Gala Romney: perianal and digital rectal examinations normal, diffusely congested colonic mucosa but otherwise normal   ESOPHAGOGASTRODUODENOSCOPY  06/28/2011   Dr. Chelsea Aus erosive reflux esophagitis, hiatal hernia-gastritis   ESOPHAGOGASTRODUODENOSCOPY (EGD) WITH PROPOFOL N/A 04/26/2017   Dr. Gala Romney: normal esophagus, small hiatal hernia, GAVE, portal hypertensive gastropathy, normal duodenal bulb and second portion, no specimens collected. 2 years screening    ESOPHAGOGASTRODUODENOSCOPY (EGD) WITH PROPOFOL N/A 06/19/2019   normal esophagus, portal gastropathy, GAVE, normal duodenum. 2 years screening.    EXTERNAL EAR SURGERY     right ear-cleaned out ear and created new eardrum   INGUINAL HERNIA REPAIR  2-3 yrs ago   left-[facility]_   INTRAMEDULLARY (IM) NAIL INTERTROCHANTERIC Right 09/02/2015   Procedure: RIGHT  INTERTROCHANTRIC HIP;  Surgeon: Renette Butters, MD;  Location: [facility];  Service: Orthopedics;  Laterality: Right;  With MAC   KNEE SURGERY     left-arthroscopy-[place]   PORTACATH PLACEMENT Left 06/08/2017   Procedure: INSERTION POWER PORT WITH  ATTACHED CATHETER LEFT SUBCLAVIAN;  Surgeon: Aviva Signs, MD;  Location: [facility];   Service: General;  Laterality: Left;   SKULL FRACTURE ELEVATION     fractured cheeck bone repaired   TOTAL HIP ARTHROPLASTY  12/18/2011   Procedure: TOTAL HIP ARTHROPLASTY;  Surgeon: Carole Civil, MD;  Location: [facility];  Service: Orthopedics;  Laterality: Left;   VIDEO BRONCHOSCOPY WITH ENDOBRONCHIAL ULTRASOUND N/A 05/04/2017   Procedure: VIDEO BRONCHOSCOPY WITH ENDOBRONCHIAL ULTRASOUND;  Surgeon: Melrose Nakayama, MD;  Location: [facility];  Service: Thoracic;  Laterality: N/A;   WISDOM TOOTH EXTRACTION      SOCIAL HISTORY:  Social History   Socioeconomic History   Marital status: Widowed    Spouse name: Not on file   Number of children: 2   Years of education: 48   Highest education level: Not on file  Occupational History   Occupation: retired    Fish farm manager: [employer]  Tobacco Use   Smoking status: Former    Packs/day: 0.00    Years: 62.00    Pack years: 0.00    Types: Cigarettes    Quit date: 02/28/2019    Years since quitting: 2.1   Smokeless tobacco: Never   Tobacco comments:    1/2 ppd > 60  years as of 06/27/17: 4-5 cigarettes or less a day  Vaping Use   Vaping Use: Never used  Substance and Sexual Activity   Alcohol use: Not Currently    Alcohol/week: 2.0 standard drinks    Types: 2 Cans of beer per week   Drug use: No   Sexual activity: Yes    Birth control/protection: None  Other Topics Concern   Not on file  Social History Narrative   Right handed   Drinks caffeine   Assistant living   Social Determinants of Health   Financial Resource Strain: Not on file  Food Insecurity: Not on file  Transportation Needs: Not on file  Physical Activity: Not on file  Stress: Not on file  Social Connections: Not on file  Intimate Partner Violence: Not on file    FAMILY HISTORY:  Family History  Problem Relation Age of Onset   Stroke Mother 62   Heart disease Father 80       MI   Diabetes Maternal Uncle    Colon cancer Neg Hx    Cancer Neg Hx      CURRENT MEDICATIONS:  Current Outpatient Medications  Medication Sig Dispense Refill   cholecalciferol (VITAMIN D) 1000 units tablet Take 1,000 Units by mouth daily.     docusate sodium (COLACE) 100 MG capsule Take 1 capsule (100 mg total) by mouth 2 (two) times daily. 60 capsule 2   donepezil (ARICEPT) 10 MG tablet Take one tablet, 10 mg daily 30 tablet 11   feeding supplement (ENSURE ENLIVE / ENSURE PLUS) LIQD Take 237 mLs by mouth 2 (two) times daily between meals.     GOODSENSE ARTIFICIAL TEARS 0.5-0.6 % SOLN Apply to eye.     guaiFENesin (MUCINEX) 600 MG 12 hr tablet Take 600 mg by mouth 2 (two) times daily as needed for cough (congestion).     Lactobacillus-Inulin (CULTURELLE DIGESTIVE DAILY PO) Take by mouth daily.     levothyroxine (SYNTHROID) 75 MCG tablet Take 1 tablet (75 mcg total) by mouth daily before breakfast. 30 tablet 3   loperamide (IMODIUM) 2 MG capsule Take by mouth as needed for diarrhea or loose stools.     NON FORMULARY Diet NAS     OXYGEN Inhale 2 L into the lungs continuous.     polyethylene glycol (MIRALAX / GLYCOLAX) 17 g packet Take 17 g by mouth daily. 14 each 0   Psyllium (METAMUCIL PO) Take by mouth. 1 capsule twice daily     rosuvastatin (CRESTOR) 10 MG tablet Take 1 tablet (10 mg total) by mouth daily. 30 tablet 0   SPIRIVA RESPIMAT 2.5 MCG/ACT AERS Inhale 2 puffs into the lungs daily.     triamcinolone (KENALOG) 0.025 % ointment Apply 1 application topically 2 (two) times daily. Apply to face and arms sparingly for dryness     vitamin B-12 (CYANOCOBALAMIN) 1000 MCG tablet Take 1 tablet (1,000 mcg total) by mouth daily. 30 tablet 6   gabapentin (NEURONTIN) 100 MG capsule Take 1 capsule (100 mg total) by mouth 3 (three) times daily for 14 days. 42 capsule 0   No current facility-administered medications for this visit.    ALLERGIES:  No Known Allergies  PHYSICAL EXAM:  Performance status (ECOG): 1 - Symptomatic but completely ambulatory  Vitals:    04/20/21 1118  BP: (!) 161/67  Pulse: (!) 50  Resp: 18  Temp: (!) 96.7 F (35.9 C)  SpO2: 99%   Wt Readings from Last 3 Encounters:  04/20/21 164  lb 9.6 oz (74.7 kg)  02/22/21 156 lb (70.8 kg)  02/10/21 151 lb 10.8 oz (68.8 kg)   Physical Exam Vitals reviewed.  Constitutional:      Appearance: Normal appearance.     Interventions: Nasal cannula in place.     Comments: In wheelchair  Cardiovascular:     Rate and Rhythm: Normal rate and regular rhythm.     Pulses: Normal pulses.     Heart sounds: Normal heart sounds.  Pulmonary:     Effort: Pulmonary effort is normal.     Breath sounds: Normal breath sounds.  Neurological:     General: No focal deficit present.     Mental Status: Ian Aguilar is alert and oriented to person, place, and time.  Psychiatric:        Mood and Affect: Mood normal.        Behavior: Behavior normal.     LABORATORY DATA:  I have reviewed the labs as listed.  CBC Latest Ref Rng & Units 04/13/2021 12/29/2020 10/27/2020  WBC 4.0 - 10.5 K/uL 3.8(L) 4.1 4.9  Hemoglobin 13.0 - 17.0 g/dL 11.5(L) 10.9(L) 12.9(L)  Hematocrit 39.0 - 52.0 % 37.0(L) 34.5(L) 40.8  Platelets 150 - 400 K/uL 146(L) 132(L) 171   CMP Latest Ref Rng & Units 04/13/2021 12/29/2020 10/27/2020  Glucose 70 - 99 mg/dL 80 96 91  BUN 8 - 23 mg/dL 15 21 25(H)  Creatinine 0.61 - 1.24 mg/dL 1.02 0.74 0.89  Sodium 135 - 145 mmol/L 136 140 142  Potassium 3.5 - 5.1 mmol/L 4.3 3.9 4.2  Chloride 98 - 111 mmol/L 98 99 98  CO2 22 - 32 mmol/L 28 35(H) 38(H)  Calcium 8.9 - 10.3 mg/dL 9.1 8.9 9.0  Total Protein 6.5 - 8.1 g/dL 6.9 6.5 -  Total Bilirubin 0.3 - 1.2 mg/dL 0.6 0.5 -  Alkaline Phos 38 - 126 U/L 98 74 -  AST 15 - 41 U/L 27 18 -  ALT 0 - 44 U/L 25 13 -    DIAGNOSTIC IMAGING:  I have independently reviewed the scans and discussed with the patient. No results found.   ASSESSMENT:  1.  Stage II (T1BN2) right upper lobe adenocarcinoma: -Chemoradiation therapy with carboplatin and paclitaxel  weekly from 06/11/2017 through 07/17/2018. -Durvalumab consolidation from 09/19/2017 through 09/17/2018. -Denies any cough or hemoptysis. -I reviewed CT chest with contrast from 06/16/2019 which showed 9 mm nodule in the medial right upper lobe unchanged.  Adjacent radiation changes.  No evidence of progression or metastatic disease. -CT chest on 12/16/2019 showed stable radiation changes and small nodule in the medial right upper lobe with no signs of recurrence.  New bandlike area of septal thickening and micro nodularity in the right lateral and left anterior upper chest, most suggestive of sequela of infection/inflammation.  Signs of hepatic cirrhosis.   PLAN:  1.  Stage II (T1BN2) right upper lobe adenocarcinoma: - Ian Aguilar is continuing to reside at [facility] after stroke. - CT chest with contrast on 12/30/2020 showed unchanged subpleural nodule of the posterior right pulmonary apex measuring 0.9 cm.  Interval enlargement of well-circumscribed rounded groundglass opacity in the central left upper lobe measuring 1.9 x 1.6 cm.  PET scan would not be helpful as this is a groundglass lesion.  Biopsy is also risky due to chance of pneumothorax. - I have recommended repeating CT of the chest with contrast in 2 months.   2.  Neuropathy: - On and off numbness in the extremities is  stable.   3.  Hypothyroidism: - I have cut back on Synthroid dose to 75 mcg daily on 02/10/2021 as his TSH was very low at 0.01. - Today his TSH has jumped up to 36.8. - Will increase Synthroid to 125 mcg daily.  We will check TSH in 2 months.  4.  Normocytic anemia: - Feraheme on 02/22/2021 and 03/03/2021. - Labs on 04/13/2021: Ferritin 219, percent saturation 41.  B12 is 874. - Continue B12 1 mg tablet daily.  No indication for parenteral iron therapy.   Orders placed this encounter:  No orders of the defined types were placed in this encounter.    Derek Jack, MD [facility] [phone]   I, Thana Ates, am acting as a scribe for Dr. Derek Jack.  I, Derek Jack MD, have reviewed the above documentation for accuracy and completeness, and I agree with the above.

## 2021-04-20 NOTE — Patient Instructions (Signed)
[facility] at [facility] Discharge Instructions   You were seen and examined today by Dr. Delton Coombes.  He reviewed the results of your CT scan which is stable.  We will increase your Synthroid dose to 125 mcg daily 1 hour before breakfast on an empty stomach. Your thyroid function is now too low.    The iron infusion you received has helped to improve your hemoglobin.  Your B12 level has also improved since you started taking B12 supplements.  Return as scheduled for lab work, office visit, and scans.   Thank you for choosing [place] at [facility] to provide your oncology and hematology care.  To afford each patient quality time with our provider, please arrive at least 15 minutes before your scheduled appointment time.   If you have a lab appointment with the [facility] please come in thru the Main Entrance and check in at the main information desk.  You need to re-schedule your appointment should you arrive 10 or more minutes late.  We strive to give you quality time with our providers, and arriving late affects you and other patients whose appointments are after yours.  Also, if you no show three or more times for appointments you may be dismissed from the clinic at the providers discretion.     Again, thank you for choosing [facility].  Our hope is that these requests will decrease the amount of time that you wait before being seen by our physicians.       _____________________________________________________________  Should you have questions after your visit to [facility], please contact our office at [phone] and follow the prompts.  Our office hours are 8:00 a.m. and 4:30 p.m. Monday - Friday.  Please note that voicemails left after 4:00 p.m. may not be returned until the following business day.  We are closed weekends and major holidays.  You do have access to a nurse 24-7, just call the main  number to the clinic [phone] and do not press any options, hold on the line and a nurse will answer the phone.    For prescription refill requests, have your pharmacy contact our office and allow 72 hours.    Due to Covid, you will need to wear a mask upon entering the hospital. If you do not have a mask, a mask will be given to you at the Main Entrance upon arrival. For doctor visits, patients may have 1 support person age 80 or older with them. For treatment visits, patients can not have anyone with them due to social distancing guidelines and our immunocompromised population.

## 2021-04-22 DIAGNOSIS — C3490 Malignant neoplasm of unspecified part of unspecified bronchus or lung: Secondary | ICD-10-CM | POA: Diagnosis not present

## 2021-04-22 DIAGNOSIS — R531 Weakness: Secondary | ICD-10-CM | POA: Diagnosis not present

## 2021-04-22 DIAGNOSIS — J439 Emphysema, unspecified: Secondary | ICD-10-CM | POA: Diagnosis not present

## 2021-04-22 DIAGNOSIS — J449 Chronic obstructive pulmonary disease, unspecified: Secondary | ICD-10-CM | POA: Diagnosis not present

## 2021-04-25 DIAGNOSIS — R2689 Other abnormalities of gait and mobility: Secondary | ICD-10-CM | POA: Diagnosis not present

## 2021-04-25 DIAGNOSIS — M6281 Muscle weakness (generalized): Secondary | ICD-10-CM | POA: Diagnosis not present

## 2021-04-27 DIAGNOSIS — R2689 Other abnormalities of gait and mobility: Secondary | ICD-10-CM | POA: Diagnosis not present

## 2021-04-27 DIAGNOSIS — M6281 Muscle weakness (generalized): Secondary | ICD-10-CM | POA: Diagnosis not present

## 2021-05-06 ENCOUNTER — Telehealth: Payer: Self-pay

## 2021-05-06 DIAGNOSIS — J449 Chronic obstructive pulmonary disease, unspecified: Secondary | ICD-10-CM | POA: Diagnosis not present

## 2021-05-06 NOTE — Telephone Encounter (Signed)
Erroneous encounter. Please disregard.

## 2021-05-09 ENCOUNTER — Other Ambulatory Visit (HOSPITAL_COMMUNITY): Payer: Self-pay | Admitting: Hematology

## 2021-05-20 DIAGNOSIS — J439 Emphysema, unspecified: Secondary | ICD-10-CM | POA: Diagnosis not present

## 2021-05-20 DIAGNOSIS — J449 Chronic obstructive pulmonary disease, unspecified: Secondary | ICD-10-CM | POA: Diagnosis not present

## 2021-05-20 DIAGNOSIS — R531 Weakness: Secondary | ICD-10-CM | POA: Diagnosis not present

## 2021-05-20 DIAGNOSIS — C3490 Malignant neoplasm of unspecified part of unspecified bronchus or lung: Secondary | ICD-10-CM | POA: Diagnosis not present

## 2021-05-27 DIAGNOSIS — J449 Chronic obstructive pulmonary disease, unspecified: Secondary | ICD-10-CM | POA: Diagnosis not present

## 2021-05-27 DIAGNOSIS — Z515 Encounter for palliative care: Secondary | ICD-10-CM | POA: Diagnosis not present

## 2021-05-27 DIAGNOSIS — F17210 Nicotine dependence, cigarettes, uncomplicated: Secondary | ICD-10-CM | POA: Diagnosis not present

## 2021-05-27 DIAGNOSIS — Z9981 Dependence on supplemental oxygen: Secondary | ICD-10-CM | POA: Diagnosis not present

## 2021-05-27 DIAGNOSIS — D692 Other nonthrombocytopenic purpura: Secondary | ICD-10-CM | POA: Diagnosis not present

## 2021-05-27 DIAGNOSIS — I739 Peripheral vascular disease, unspecified: Secondary | ICD-10-CM | POA: Diagnosis not present

## 2021-05-27 DIAGNOSIS — K7030 Alcoholic cirrhosis of liver without ascites: Secondary | ICD-10-CM | POA: Diagnosis not present

## 2021-05-27 DIAGNOSIS — Z7951 Long term (current) use of inhaled steroids: Secondary | ICD-10-CM | POA: Diagnosis not present

## 2021-06-06 DIAGNOSIS — J449 Chronic obstructive pulmonary disease, unspecified: Secondary | ICD-10-CM | POA: Diagnosis not present

## 2021-06-16 ENCOUNTER — Inpatient Hospital Stay (HOSPITAL_COMMUNITY): Payer: PPO | Attending: Hematology

## 2021-06-16 ENCOUNTER — Inpatient Hospital Stay (HOSPITAL_COMMUNITY): Payer: PPO

## 2021-06-16 ENCOUNTER — Ambulatory Visit (HOSPITAL_COMMUNITY)
Admission: RE | Admit: 2021-06-16 | Discharge: 2021-06-16 | Disposition: A | Discharge status: Home / Self Care | Payer: PPO | Source: Ambulatory Visit | Attending: Hematology | Admitting: Hematology

## 2021-06-16 DIAGNOSIS — C3491 Malignant neoplasm of unspecified part of right bronchus or lung: Secondary | ICD-10-CM | POA: Diagnosis not present

## 2021-06-16 DIAGNOSIS — Z79899 Other long term (current) drug therapy: Secondary | ICD-10-CM | POA: Diagnosis not present

## 2021-06-16 DIAGNOSIS — I700 Atherosclerosis of aorta: Secondary | ICD-10-CM | POA: Diagnosis not present

## 2021-06-16 DIAGNOSIS — C3411 Malignant neoplasm of upper lobe, right bronchus or lung: Secondary | ICD-10-CM | POA: Insufficient documentation

## 2021-06-16 DIAGNOSIS — G629 Polyneuropathy, unspecified: Secondary | ICD-10-CM | POA: Insufficient documentation

## 2021-06-16 DIAGNOSIS — C3490 Malignant neoplasm of unspecified part of unspecified bronchus or lung: Secondary | ICD-10-CM | POA: Diagnosis not present

## 2021-06-16 DIAGNOSIS — R918 Other nonspecific abnormal finding of lung field: Secondary | ICD-10-CM | POA: Diagnosis not present

## 2021-06-16 DIAGNOSIS — D649 Anemia, unspecified: Secondary | ICD-10-CM | POA: Diagnosis not present

## 2021-06-16 DIAGNOSIS — D509 Iron deficiency anemia, unspecified: Secondary | ICD-10-CM

## 2021-06-16 DIAGNOSIS — J439 Emphysema, unspecified: Secondary | ICD-10-CM | POA: Diagnosis not present

## 2021-06-16 DIAGNOSIS — J449 Chronic obstructive pulmonary disease, unspecified: Secondary | ICD-10-CM | POA: Diagnosis not present

## 2021-06-16 DIAGNOSIS — J40 Bronchitis, not specified as acute or chronic: Secondary | ICD-10-CM | POA: Diagnosis not present

## 2021-06-16 LAB — COMPREHENSIVE METABOLIC PANEL
ALT: 17 U/L (ref 0–44)
AST: 20 U/L (ref 15–41)
Albumin: 4.3 g/dL (ref 3.5–5.0)
Alkaline Phosphatase: 101 U/L (ref 38–126)
Anion gap: 6 (ref 5–15)
BUN: 15 mg/dL (ref 8–23)
CO2: 34 mmol/L — ABNORMAL HIGH (ref 22–32)
Calcium: 9.1 mg/dL (ref 8.9–10.3)
Chloride: 95 mmol/L — ABNORMAL LOW (ref 98–111)
Creatinine, Ser: 0.97 mg/dL (ref 0.61–1.24)
GFR, Estimated: >60 mL/min (ref >60)
Glucose, Bld: 100 mg/dL — ABNORMAL HIGH (ref 70–99)
Potassium: 4.4 mmol/L (ref 3.5–5.1)
Sodium: 135 mmol/L (ref 135–145)
Total Bilirubin: 0.7 mg/dL (ref 0.3–1.2)
Total Protein: 7.6 g/dL (ref 6.5–8.1)

## 2021-06-16 LAB — CBC WITH DIFFERENTIAL/PLATELET
Abs Immature Granulocytes: 0.01 10*3/uL (ref 0.00–0.07)
Basophils Absolute: 0 10*3/uL (ref 0.0–0.1)
Basophils Relative: 0 %
Eosinophils Absolute: 0.1 10*3/uL (ref 0.0–0.5)
Eosinophils Relative: 2 %
HCT: 36.6 % — ABNORMAL LOW (ref 39.0–52.0)
Hemoglobin: 11.6 g/dL — ABNORMAL LOW (ref 13.0–17.0)
Immature Granulocytes: 0 %
Lymphocytes Relative: 13 %
Lymphs Abs: 0.5 10*3/uL — ABNORMAL LOW (ref 0.7–4.0)
MCH: 31.9 pg (ref 26.0–34.0)
MCHC: 31.7 g/dL (ref 30.0–36.0)
MCV: 100.5 fL — ABNORMAL HIGH (ref 80.0–100.0)
Monocytes Absolute: 0.4 10*3/uL (ref 0.1–1.0)
Monocytes Relative: 11 %
Neutro Abs: 2.8 10*3/uL (ref 1.7–7.7)
Neutrophils Relative %: 74 %
Platelets: 153 10*3/uL (ref 150–400)
RBC: 3.64 MIL/uL — ABNORMAL LOW (ref 4.22–5.81)
RDW: 14.5 % (ref 11.5–15.5)
WBC: 3.8 10*3/uL — ABNORMAL LOW (ref 4.0–10.5)
nRBC: 0 % (ref 0.0–0.2)

## 2021-06-16 LAB — IRON AND TIBC
Iron: 46 ug/dL (ref 45–182)
Saturation Ratios: 18 % (ref 17.9–39.5)
TIBC: 251 ug/dL (ref 250–450)
UIBC: 205 ug/dL

## 2021-06-16 LAB — TSH: TSH: 0.999 u[IU]/mL (ref 0.350–4.500)

## 2021-06-16 LAB — FERRITIN: Ferritin: 205 ng/mL (ref 24–336)

## 2021-06-16 LAB — POCT I-STAT CREATININE: Creatinine, Ser: 1.2 mg/dL (ref 0.61–1.24)

## 2021-06-16 MED ORDER — IOHEXOL 300 MG/ML  SOLN
80.0000 mL | Freq: Once | INTRAMUSCULAR | Status: AC | PRN
  Administered 2021-06-16: 80 mL via INTRAVENOUS

## 2021-06-20 DIAGNOSIS — J439 Emphysema, unspecified: Secondary | ICD-10-CM | POA: Diagnosis not present

## 2021-06-20 DIAGNOSIS — R531 Weakness: Secondary | ICD-10-CM | POA: Diagnosis not present

## 2021-06-20 DIAGNOSIS — C3490 Malignant neoplasm of unspecified part of unspecified bronchus or lung: Secondary | ICD-10-CM | POA: Diagnosis not present

## 2021-06-20 DIAGNOSIS — J449 Chronic obstructive pulmonary disease, unspecified: Secondary | ICD-10-CM | POA: Diagnosis not present

## 2021-06-22 ENCOUNTER — Ambulatory Visit (INDEPENDENT_AMBULATORY_CARE_PROVIDER_SITE_OTHER): Payer: PPO | Admitting: Gastroenterology

## 2021-06-22 ENCOUNTER — Encounter: Payer: Self-pay | Admitting: Gastroenterology

## 2021-06-22 VITALS — BP 124/62 | HR 67 | Temp 98.2°F | Ht 71.0 in | Wt 165.6 lb

## 2021-06-22 DIAGNOSIS — K7030 Alcoholic cirrhosis of liver without ascites: Secondary | ICD-10-CM | POA: Diagnosis not present

## 2021-06-22 NOTE — Progress Notes (Signed)
? ? ? ? ? ?Gastroenterology Office Note   ? ? ?Primary Care Physician:  Sharilyn Sites, MD  ?Primary Gastroenterologist: Dr. Gala Romney  ? ? ?Chief Complaint  ? ?Chief Complaint  ?Patient presents with  ? Follow-up  ?  No current issues to discuss.   ? ? ? ?History of Present Illness  ? ?Ian Aguilar is an 81 y.o. male presenting today in follow-up with a history of presenting today with a history of cirrhosis presumably related to ETOH abuse. 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 He has had multiple CTs but only one MRI in past. On CT, the cystic lesion has remained stable for approximately 7 years. Surveillance MRI Completed Feb 2022 and due again in 2 years. Completed Hepatitis vaccinations. Next EGD for surveillance due in 2023. Last colonoscopy actually in 2019. Rectal bleeding noted at appointment in Nov 2022 that was self-limiting.  ? ?US abdomen needs to be updated. Normocytic anemia noted around Nov 2022 with IDA. Has receved Feraheme twice.  ? ?2 liters oxygen. No abdominal pain, N/V, changes in bowel habits, constipation, diarrhea, overt GI bleeding, GERD, dysphagia, unexplained weight loss, lack of appetite, unexplained weight gain.  ? ?He desires to forgo any further endoscopic procedures or ultrasounds. He states that as he is 10, he wants to hold off on these. Pursuing quality over quantity of life.  ? ? ? ? ? ? ?Past Medical History:  ?Diagnosis Date  ? Anxiety   ? Arthritis   ? Cancer [facility])   ? Cirrhosis ([place])   ? confirmed by MRI on 07/04/11.    ? COPD (chronic obstructive pulmonary disease) ([place])   ? Depression with anxiety   ? ETOH abuse   ? Hyperlipidemia   ? Hypertension   ? diet control  ? Nodule of upper lobe of right lung   ? with mediastinal adenopathy  ? Wears dentures   ? Wears glasses   ? ? ?Past Surgical History:  ?Procedure Laterality Date  ? bilateral cataract surgery    ?   ? broken arm  6 yrs ago  ?  left otif of wrist-[place]  ? CLOSED REDUCTION WRIST FRACTURE Right 09/02/2015  ? Procedure: RIGHT WRIST REDUCTION;  Surgeon: Renette Butters, MD;  Location: [place];  Service: Orthopedics;  Laterality: Right;  with MAC  ? COLONOSCOPY  1996  ? [facility]hman: external hemorrhoids, no polyps  ? COLONOSCOPY  06/28/2011  ? Dr. Ala Bent diverticulosis,tubular adenoma, hyperplastic polyp  ? COLONOSCOPY WITH PROPOFOL N/A 04/26/2017  ? Dr. Gala Romney: perianal and digital rectal examinations normal, diffusely congested colonic mucosa but otherwise normal  ? ESOPHAGOGASTRODUODENOSCOPY  06/28/2011  ? Dr. Chelsea Aus erosive reflux esophagitis, hiatal hernia-gastritis  ? ESOPHAGOGASTRODUODENOSCOPY (EGD) WITH PROPOFOL N/A 04/26/2017  ? Dr. Gala Romney: normal esophagus, small hiatal hernia, GAVE, portal hypertensive gastropathy, normal duodenal bulb and second portion, no specimens collected. 2 years screening   ? ESOPHAGOGASTRODUODENOSCOPY (EGD) WITH PROPOFOL N/A 06/19/2019  ? normal esophagus, portal gastropathy, GAVE, normal duodenum. 2 years screening.   ? EXTERNAL EAR SURGERY    ? right ear-cleaned out ear and created new eardrum  ? INGUINAL HERNIA REPAIR  2-3 yrs ago  ? left-[facility]_  ? INTRAMEDULLARY (IM) NAIL INTERTROCHANTERIC Right 09/02/2015  ? Procedure: RIGHT  INTERTROCHANTRIC HIP;  Surgeon: Renette Butters, MD;  Location: [place];  Service: Orthopedics;  Laterality: Right;  With MAC  ? KNEE SURGERY    ?  left-arthroscopy-[place]  ? PORTACATH PLACEMENT Left 06/08/2017  ? Procedure: INSERTION POWER PORT WITH  ATTACHED CATHETER LEFT SUBCLAVIAN;  Surgeon: Aviva Signs, MD;  [facility];  Service: General;  Laterality: Left;  ? SKULL FRACTURE ELEVATION    ? fractured cheeck bone repaired  ? TOTAL HIP ARTHROPLASTY  12/18/2011  ? Procedure: TOTAL HIP ARTHROPLASTY;  Surgeon: Carole Civil, MD;  [facility];  Service: Orthopedics;  Laterality: Left;  ? VIDEO BRONCHOSCOPY WITH ENDOBRONCHIAL ULTRASOUND N/A 05/04/2017  ? Procedure:  VIDEO BRONCHOSCOPY WITH ENDOBRONCHIAL ULTRASOUND;  Surgeon: Melrose Nakayama, MD;  Location: [place];  Service: Thoracic;  Laterality: N/A;  ? WISDOM TOOTH EXTRACTION    ? ? ?Current Outpatient Medications  ?Medication Sig Dispense Refill  ? albuterol (VENTOLIN HFA) 108 (90 Base) MCG/ACT inhaler Inhale 90 mcg into the lungs 4 (four) times daily as needed.    ? amoxicillin-clavulanate (AUGMENTIN) 875-125 MG tablet Take 1 tablet by mouth 2 (two) times daily.    ? cholecalciferol (VITAMIN D) 1000 units tablet Take 1,000 Units by mouth daily.    ? docusate sodium (COLACE) 100 MG capsule Take 100 mg by mouth 2 (two) times daily.    ? donepezil (ARICEPT) 10 MG tablet Take one tablet, 10 mg daily 30 tablet 11  ? feeding supplement (ENSURE ENLIVE / ENSURE PLUS) LIQD Take 237 mLs by mouth 2 (two) times daily between meals.    ? GOODSENSE ARTIFICIAL TEARS 0.5-0.6 % SOLN Apply to eye.    ? Lactobacillus-Inulin (CULTURELLE DIGESTIVE DAILY PO) Take by mouth daily.    ? levothyroxine (SYNTHROID) 125 MCG tablet TAKE (1) TABLET BY MOUTH ONCE DAILY ON AN EMPTY STOMACH. 30 tablet 5  ? lidocaine-prilocaine (EMLA) cream Apply 1 application topically as needed. 30 g 3  ? NON FORMULARY Diet NAS    ? OXYGEN Inhale 2 L into the lungs continuous.    ? predniSONE (DELTASONE) 10 MG tablet Take 10 mg by mouth daily. For 3 days then decrease to one tablet every 3 days until gone.    ? Psyllium (METAMUCIL PO) Take by mouth. 1 capsule twice daily    ? rosuvastatin (CRESTOR) 10 MG tablet Take 1 tablet (10 mg total) by mouth daily. 30 tablet 0  ? SPIRIVA RESPIMAT 2.5 MCG/ACT AERS Inhale 2 puffs into the lungs daily.    ? triamcinolone (KENALOG) 0.025 % ointment Apply 1 application topically 2 (two) times daily. Apply to face and arms sparingly for dryness    ? vitamin B-12 (CYANOCOBALAMIN) 1000 MCG tablet Take 1 tablet (1,000 mcg total) by mouth daily. 30 tablet 6  ? gabapentin (NEURONTIN) 100 MG capsule Take 1 capsule (100 mg total) by mouth 3  (three) times daily for 14 days. 42 capsule 0  ? ?No current facility-administered medications for this visit.  ? ? ?Allergies as of 06/22/2021  ? (No Known Allergies)  ? ? ?Family History  ?Problem Relation Age of Onset  ? Stroke Mother 64  ? Heart disease Father 84  ?     MI  ? Diabetes Maternal Uncle   ? Colon cancer Neg Hx   ? Cancer Neg Hx   ? ? ?Social History  ? ?Socioeconomic History  ? Marital status: Widowed  ?  Spouse name: Not on file  ? Number of children: 2  ? Years of education: 24  ? Highest education level: Not on file  ?Occupational History  ? Occupation: retired  ?  Employer: [employer]  ?  Tobacco Use  ? Smoking status: Former  ?  Packs/day: 0.00  ?  Years: 62.00  ?  Pack years: 0.00  ?  Types: Cigarettes  ?  Quit date: 02/28/2019  ?  Years since quitting: 2.3  ? Smokeless tobacco: Never  ? Tobacco comments:  ?  1/2 ppd > 60 years as of 06/27/17: 4-5 cigarettes or less a day  ?Vaping Use  ? Vaping Use: Never used  ?Substance and Sexual Activity  ? Alcohol use: Not Currently  ?  Alcohol/week: 2.0 standard drinks  ?  Types: 2 Cans of beer per week  ? Drug use: No  ? Sexual activity: Yes  ?  Birth control/protection: None  ?Other Topics Concern  ? Not on file  ?Social History Narrative  ? Right handed  ? 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 ?Social Determinants of Health  ? ?Financial Resource Strain: Not on file  ?Food Insecurity: Not on file  ?Transportation Needs: Not on file  ?Physical Activity: Not on file  ?Stress: Not on file  ?Social Connections: Not on file  ?Intimate Partner Violence: Not on file  ? ? ? ?Review of Systems  ? ?Gen: Denies any fever, chills, fatigue, weight loss, lack of appetite.  ?CV: Denies chest pain, heart palpitations, peripheral edema, syncope.  ?Resp: Denies shortness of breath at rest or with exertion. Denies wheezing or cough.  ?GI: Denies dysphagia or odynophagia. Denies jaundice, hematemesis, fecal incontinence. ?GU : Denies urinary burning, urinary  frequency, urinary hesitancy ?MS: Denies joint pain, muscle weakness, cramps, or limitation of movement.  ?Derm: Denies rash, itching, dry skin ?Psych: Denies depression, anxiety, memory loss, and confusion

## 2021-06-22 NOTE — Patient Instructions (Signed)
I am glad you are doing well! ? ?We will see you back as needed! ? ?Please call with any bleeding, nausea, vomiting, abdominal pain, confusion, mental status changes, yellowing of skin or eyes.  ? ?I will miss seeing you! ? ?I enjoyed seeing you again today! As you know, I value our relationship and want to provide genuine, compassionate, and quality care. I welcome your feedback. If you receive a survey regarding your visit,  I greatly appreciate you taking time to fill this out. See you next time! ? ?Annitta Needs, PhD, ANP-BC ?[facility]  ? ?

## 2021-06-23 ENCOUNTER — Other Ambulatory Visit (HOSPITAL_COMMUNITY): Payer: Self-pay | Admitting: *Deleted

## 2021-06-23 ENCOUNTER — Inpatient Hospital Stay (HOSPITAL_BASED_OUTPATIENT_CLINIC_OR_DEPARTMENT_OTHER): Payer: PPO | Admitting: Hematology

## 2021-06-23 VITALS — BP 155/54 | HR 66 | Temp 97.9°F | Resp 18 | Ht 71.5 in | Wt 162.4 lb

## 2021-06-23 DIAGNOSIS — C3491 Malignant neoplasm of unspecified part of right bronchus or lung: Secondary | ICD-10-CM | POA: Diagnosis not present

## 2021-06-23 DIAGNOSIS — D509 Iron deficiency anemia, unspecified: Secondary | ICD-10-CM | POA: Diagnosis not present

## 2021-06-23 DIAGNOSIS — C3411 Malignant neoplasm of upper lobe, right bronchus or lung: Secondary | ICD-10-CM | POA: Diagnosis not present

## 2021-06-23 NOTE — Patient Instructions (Signed)
[place] at [facility] ?Discharge Instructions ? ? ?You were seen and examined today by Dr. Delton Coombes. ? ?He reviewed the results of your CT scan which is stable.  ? ?We will see you back in 6 months.  ? ? ?Thank you for choosing [place] at [facility] to provide your oncology and hematology care.  To afford each patient quality time with our provider, please arrive at least 15 minutes before your scheduled appointment time.  ? ?If you have a lab appointment with the [facility] please come in thru the Main Entrance and check in at the main information desk. ? ?You need to re-schedule your appointment should you arrive 10 or more minutes late.  We strive to give you quality time with our providers, and arriving late affects you and other patients whose appointments are after yours.  Also, if you no show three or more times for appointments you may be dismissed from the clinic at the providers discretion.     ?Again, thank you for choosing [facility].  Our hope is that these requests will decrease the amount of time that you wait before being seen by our physicians.       ?_____________________________________________________________ ? ?Should you have questions after your visit to [facility], please contact our office at [phone] and follow the prompts.  Our office hours are 8:00 a.m. and 4:30 p.m. Monday - Friday.  Please note that voicemails left after 4:00 p.m. may not be returned until the following business day.  We are closed weekends and major holidays.  You do have access to a nurse 24-7, just call the main number to the clinic [phone] and do not press any options, hold on the line and a nurse will answer the phone.   ? ?For prescription refill requests, have your pharmacy contact our office and allow 72 hours.   ? ?Due to Covid, you will need to wear a mask upon entering the hospital. If you do not have a mask, a  mask will be given to you at the Main Entrance upon arrival. For doctor visits, patients may have 1 support person age 69 or older with them. For treatment visits, patients can not have anyone with them due to social distancing guidelines and our immunocompromised population.  ? ?   ?

## 2021-06-23 NOTE — Progress Notes (Signed)
? ?[place] ?[address] ?[place], [place] [address] ? ? ?CLINIC:  ?Medical Oncology/Hematology ? ?PCP:  ?Sharilyn Sites, MD ?[address] / [place] [address] ?[phone] ? ? 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 ?PRIOR THERAPY:  ?1. Chemoradiation with carboplatin and paclitaxel weekly from 06/11/2017 to 07/16/2017. ?2. Consolidation with durvalumab from 09/19/2017 to 09/17/2018. ? ?NGS Results: not done ? ?CURRENT THERAPY: surveillance ? ?BRIEF ONCOLOGIC HISTORY:  ?Oncology History  ?Carcinoma, lung ([place])  ?06/05/2017 Initial Diagnosis  ? Carcinoma, lung ([place]) ? ?  ?06/11/2017 - 07/16/2017 Chemotherapy  ? The patient had dexamethasone (DECADRON) 4 MG tablet, 8 mg, Oral, Daily, 1 of 1 cycle, Start date: --, End date: -- ?palonosetron (ALOXI) injection 0.25 mg, 0.25 mg, Intravenous,  Once, 6 of 6 cycles ?Administration: 0.25 mg (06/11/2017), 0.25 mg (07/09/2017), 0.25 mg (07/16/2017), 0.25 mg (06/18/2017), 0.25 mg (06/25/2017), 0.25 mg (07/02/2017) ?CARBOplatin (PARAPLATIN) 190 mg in sodium chloride 0.9 % 250 mL chemo infusion, 190 mg (100 % of original dose 192.8 mg), Intravenous,  Once, 6 of 6 cycles ?Dose modification:   (original dose 192.8 mg, Cycle 1),   (original dose 192.8 mg, Cycle 5), 144.6 mg (original dose 144.6 mg, Cycle 6),   (original dose 192.8 mg, Cycle 2),   (original dose 192.8 mg, Cycle 3),   (original dose 192.8 mg, Cycle 4) ?Administration: 190 mg (06/11/2017), 190 mg (07/09/2017), 140 mg (07/16/2017), 190 mg (06/18/2017), 190 mg (06/25/2017), 190 mg (07/02/2017) ?PACLitaxel (TAXOL) 90 mg in dextrose 5 % 250 mL chemo infusion (</= 81m/m2), 45 mg/m2 = 90 mg, Intravenous,  Once, 6 of 6 cycles ?Dose modification: 40.5 mg/m2 (90 % of original dose 45 mg/m2, Cycle 5, Reason: Other (see comments), Comment: early neuropathy), 36 mg/m2 (80 % of original dose 45 mg/m2, Cycle 6, Reason: Provider Judgment) ?Administration: 90 mg (06/11/2017), 78 mg  (07/09/2017), 72 mg (07/16/2017), 90 mg (06/18/2017), 78 mg (06/25/2017), 78 mg (07/02/2017) ? ? for chemotherapy treatment.  ? ?  ?Malignant ne[place] of [place]us and lung ([facility]  ?06/08/2017 Initial Diagnosis  ? Malignant [facility] and lung ([facility] ? ?  ?09/20/2017 -  Chemotherapy  ? The patient had durvalumab (IMFINZI) 860 mg in sodium chloride 0.9 % 100 mL chemo infusion, 800 mg, Intravenous,  Once, 26 of 26 cycles ?Administration: 860 mg (09/20/2017), 860 mg (10/04/2017), 860 mg (10/18/2017), 860 mg (11/01/2017), 860 mg (11/15/2017), 860 mg (11/29/2017), 860 mg (12/13/2017), 860 mg (12/27/2017), 860 mg (01/10/2018), 860 mg (01/28/2018), 860 mg (02/11/2018), 860 mg (02/25/2018), 860 mg (03/11/2018), 860 mg (03/25/2018), 860 mg (04/08/2018), 860 mg (04/22/2018), 860 mg (05/08/2018), 860 mg (05/27/2018), 860 mg (06/10/2018), 860 mg (06/25/2018), 860 mg (07/09/2018), 860 mg (07/23/2018), 860 mg (08/06/2018), 860 mg (08/20/2018), 860 mg (09/03/2018), 860 mg (09/17/2018) ? ? for chemotherapy treatment.  ? ?  ? ? ?CANCER STAGING: ?Cancer Staging  ?No matching staging information was found for the patient. ? ?INTERVAL HISTORY:  ?Ian Aguilar a 81y.o. male, returns for routine follow-up of his stage II right upper lobe adenocarcinoma. [name] last seen on 04/20/2021.  ? ?Today he reports feeling good. He reports SOB for which he has started albuterol and prednisone. He denies recent infections. ? ?REVIEW OF SYSTEMS:  ?Review of Systems  ?Constitutional:  Negative for appetite change and fatigue.  ?Respiratory:  Positive for shortness of breath (2L O2).   ?All other systems reviewed and are negative. ? ?PAST MEDICAL/SURGICAL HISTORY:  ?  Past Medical History:  ?Diagnosis Date  ? Anxiety   ? Arthritis   ? Cancer [facility])   ? Cirrhosis ([place])   ? confirmed by MRI on 07/04/11.    ? COPD (chronic obstructive pulmonary disease) ([place])   ? Depression with anxiety   ? ETOH abuse   ? Hyperlipidemia   ? Hypertension   ? diet control  ? Nodule of upper  lobe of right lung   ? with mediastinal adenopathy  ? Wears dentures   ? Wears glasses   ? ?Past Surgical History:  ?Procedure Laterality Date  ? bilateral cataract surgery    ? [place]  ? broken arm  6 yrs ago  ? left otif of wrist-[place]  ? CLOSED REDUCTION WRIST FRACTURE Right 09/02/2015  ? Procedure: RIGHT WRIST REDUCTION;  Surgeon: Renette Butters, MD;  Location: [place];  Service: Orthopedics;  Laterality: Right;  with MAC  ? COLONOSCOPY  1996  ? Rehman: external hemorrhoids, no polyps  ? COLONOSCOPY  06/28/2011  ? Dr. Ala Bent diverticulosis,tubular adenoma, hyperplastic polyp  ? COLONOSCOPY WITH PROPOFOL N/A 04/26/2017  ? Dr. Gala Romney: perianal and digital rectal examinations normal, diffusely congested colonic mucosa but otherwise normal  ? ESOPHAGOGASTRODUODENOSCOPY  06/28/2011  ? Dr. Chelsea Aus erosive reflux esophagitis, hiatal hernia-gastritis  ? ESOPHAGOGASTRODUODENOSCOPY (EGD) WITH PROPOFOL N/A 04/26/2017  ? Dr. Gala Romney: normal esophagus, small hiatal hernia, GAVE, portal hypertensive gastropathy, normal duodenal bulb and second portion, no specimens collected. 2 years screening   ? ESOPHAGOGASTRODUODENOSCOPY (EGD) WITH PROPOFOL N/A 06/19/2019  ? normal esophagus, portal gastropathy, GAVE, normal duodenum. 2 years screening.   ? EXTERNAL EAR SURGERY    ? right ear-cleaned out ear and created new eardrum  ? INGUINAL HERNIA REPAIR  2-3 yrs ago  ? left-[facility]_  ? INTRAMEDULLARY (IM) NAIL INTERTROCHANTERIC Right 09/02/2015  ? Procedure: RIGHT  INTERTROCHANTRIC HIP;  Surgeon: Renette Butters, MD;  Location: [place];  Service: Orthopedics;  Laterality: Right;  With MAC  ? KNEE SURGERY    ? left-arthroscopy-[place]  ? PORTACATH PLACEMENT Left 06/08/2017  ? Procedure: INSERTION POWER PORT WITH  ATTACHED CATHETER LEFT SUBCLAVIAN;  Surgeon: Aviva Signs, MD;  [facility];  Service: General;  Laterality: Left;  ? SKULL FRACTURE ELEVATION    ? fractured cheeck bone repaired  ? TOTAL HIP ARTHROPLASTY   12/18/2011  ? Procedure: TOTAL HIP ARTHROPLASTY;  Surgeon: Carole Civil, MD;  [facility];  Service: Orthopedics;  Laterality: Left;  ? VIDEO BRONCHOSCOPY WITH ENDOBRONCHIAL ULTRASOUND N/A 05/04/2017  ? Procedure: VIDEO BRONCHOSCOPY WITH ENDOBRONCHIAL ULTRASOUND;  Surgeon: Melrose Nakayama, MD;  Location: [place];  Service: Thoracic;  Laterality: N/A;  ? WISDOM TOOTH EXTRACTION    ? ? ?SOCIAL HISTORY:  ?Social History  ? ?Socioeconomic History  ? Marital status: Widowed  ?  Spouse name: Not on file  ? Number of children: 2  ? Years of education: 64  ? Highest education level: Not on file  ?Occupational History  ? Occupation: retired  ?  Employer: [employer]  ?Tobacco Use  ? Smoking status: Former  ?  Packs/day: 0.00  ?  Years: 62.00  ?  Pack years: 0.00  ?  Types: Cigarettes  ?  Quit date: 02/28/2019  ?  Years since quitting: 2.3  ? Smokeless tobacco: Never  ? Tobacco comments:  ?  1/2 ppd > 60 years as of 06/27/17: 4-5 cigarettes or less a day  ?Vaping Use  ? Vaping Use: Never used  ?  Substance and Sexual Activity  ? Alcohol use: Not Currently  ?  Alcohol/week: 2.0 standard drinks  ?  Types: 2 Cans of beer per week  ? Drug use: No  ? Sexual activity: Yes  ?  Birth control/protection: None  ?Other Topics Concern  ? Not on file  ?Social History Narrative  ? Right handed  ? 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 ?Social Determinants of Health  ? ?Financial Resource Strain: Not on file  ?Food Insecurity: Not on file  ?Transportation Needs: Not on file  ?Physical Activity: Not on file  ?Stress: Not on file  ?Social Connections: Not on file  ?Intimate Partner Violence: Not on file  ? ? ?FAMILY HISTORY:  ?Family History  ?Problem Relation Age of Onset  ? Stroke Mother 63  ? Heart disease Father 3  ?     MI  ? Diabetes Maternal Uncle   ? Colon cancer Neg Hx   ? Cancer Neg Hx   ? ? ?CURRENT MEDICATIONS:  ?Current Outpatient Medications  ?Medication Sig Dispense Refill  ? albuterol (VENTOLIN HFA) 108  (90 Base) MCG/ACT inhaler Inhale 90 mcg into the lungs 4 (four) times daily as needed.    ? amoxicillin-clavulanate (AUGMENTIN) 875-125 MG tablet Take 1 tablet by mouth 2 (two) times daily.    ? cholecalciferol (VITAMIN

## 2021-06-26 NOTE — Progress Notes (Signed)
? ?Assessment/Plan:  ? 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 ?Ian Aguilar is a 81 y.o. 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 MoCA during his last visit in August 2022 was 11/30.  Today's visit, his MMSE is actually 27/30.  He is on donepezil 10 mg daily, tolerating well.  Overall, he is memory has improved since moving to a adult living facility, able to participate in different activities, and is well cared by his caretakers. ? ? Recommendations:  ?Discussed safety both in and out of the home.  ?Discussed the importance of regular daily schedule with inclusion of crossword puzzles to maintain brain function.  ?Continue to monitor mood by PCP ?Stay active at least 30 minutes at least 3 times a week.  ?Naps should be scheduled and should be no longer than 60 minutes and should not occur after 2 PM.  ?Mediterranean diet is recommended  ?Continue donepezil 10 mg daily Side effects were discussed ?Follow up in  6 months. ? ? ?Case discussed with Dr. Delice Lesch who agrees with the plan ? ? ? ?Subjective:  ? ?Ian Aguilar is seen today in follow up for memory loss. This patient is accompanied in the office by his caretaker Amber who supplements the history.  Previous records as well as any outside records available were reviewed prior to todays visit.  He is on donepezil 10 mg daily, tolerating well.    ?He reports that his memory is better than before, he continues to enjoy doing crossword puzzles for about 2 hours a day.   ?Patient lives  at [facility] long term care  center in [place], [place] ?repeats oneself?  He denies repeating himself ?Disoriented when walking into a room?  He denies ?Leaving objects in unusual places?  Patient denies ?Ambulates  with difficulty? Uses a walker, occasionally a wheelchair to carry oxygen if he goes somewhere. He does PT at the facility.  ?Recent falls?  He denies ?Any head injuries?  Patient denies ?History of seizures?  Patient denies ?Wandering behavior?  Patient denies  ?patient drives?   Patient no longer drives    ?Any mood changes such irritability agitation?  Denies, caretaker agrees ?Any history of depression?:  He is doing better from a depression standpoint, after making new acquaintances in the facility, and enjoying all the activities there. ?Hallucinations?  Denies ?Paranoia?  Denies ?Patient reports that he sleeps well without vivid dreams, REM behavior or sleepwalking    ?History of sleep apnea?  He uses oxygen all day ?Any hygiene concerns?  Denies ?Independent of bathing and dressing? Endorsed. Nursing assists patient due to gait instability. ?Does the patient needs help with medications? Facility dispenses the meds to him ?Who is in charge of the finances? Son and daughter manage the finances ?Any changes in appetite?  Denies, he enjoys the food, he has gained a few pounds. ?Patient have trouble swallowing?  Denies ?Does the patient cook?  Denies ?Any kitchen accidents such as leaving the stove on?  Denies ?Any headaches?  Denies ?The double vision?  Denies ?Any focal numbness or tingling?  Denies ?Chronic back pain" denies ?Unilateral weakness?  Denies ?  Any tremors?  Denies ?Any history of anosmia?  Denies ?Any incontinence of urine?  Denies ?Any bowel dysfunction?  He denies ?  ?  ? Initial Visit 09/27/20 The patient is seen in neurologic consultation at the request of Redmond School, MD for the evaluation of memory.  The patient is accompanied by caregiver who supplements the history. He is a 81 y.o. year old RH  male who  has had memory issues for about  several years, worse over the last 6 months, when he lost his wife.  At the time, he went into a deep depression, as he was planning to move with her at [facility] in [place], and he was going to take his dog with him.  On his wife's death, he felt "lost, did not want to get out of the bed, did not want to interact with anyone, and he was feeling very irritable ".  Sertraline was tried, without any improvement.  It was during that time, that he was found to be severely hypothyroid with a TSH of 17, and was started on thyroid medication, which improved significantly his mood, and irritability.  He reports his memory being "not good, especially with long-term memory ".  He states that at times he cannot remember the names of his grandmother, grandfather, or children. ?While before he was sleeping the whole day, now he sleeps well at night, without vivid dreams or sleepwalking, paranoia or hallucinations.  Occasionally he takes a nap. ?He needs assistance with bathing and dressing, as his gait is chronically unstable.  Medications are given by the facility staff, and the finances are managed by his son and daughter who is the power of attorney.  Denies leaving objects in unusual places.  Appetite is better, denies trouble swallowing.  He does not cook.  "My wife used to do all the cooking".  He ambulates with the walker very short distances, otherwise he uses a wheelchair to help move from one side to the other, as he is very weak, and has an oxygen tank with him.  He does participate in physical therapy at the facility.  He denies any recent falls.  He no longer drives because of "memory issues ".  His son "was afraid that I would get lost so he took the keys".  He denies any headaches, although he had an injury to the head in May 2022, with negative work-up at the ER.  He denies double vision, dizziness, focal numbness or tingling, unilateral weakness or  tremors, urine incontinence or retention.  Denies constipation, diarrhea, or anosmia.  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  He quit heavy alcohol 10 years ago mostly beer and bourbon.  He quit 1 pack a day tobacco use for most of his life.  Family history remarkable for dementia in father. ?  ?  ?Labs 07/19/20  ?CBC and CMP unremarkable ?UA neg  ?TSH 05/13/20 17.277 High, T4 1.28 elevated ?B12 04/22/20 645 nl  ? ?CT head wo contrast 07/19/20 ? ?No evidence of acute intracranial abnormality.Right anterior scalp hematoma.Stable mild-to-moderate cerebral atrophy and cerebral white matter chronic small vessel ischemic disease. Known small chronic right cerebellar infarct.Mild paranasal sinus mucosal thickening at the imaged levels, most notably right maxillary. Right mastoid effusion. ? ? ?PREVIOUS MEDICATIONS:  ? ?CURRENT MEDICATIONS:  ?Outpatient Encounter Medications as of 06/27/2021  ?Medication Sig  ? albuterol (VENTOLIN HFA) 108 (90 Base) MCG/ACT inhaler Inhale 90 mcg into the lungs 4 (four)  times daily as needed.  ? amoxicillin-clavulanate (AUGMENTIN) 875-125 MG tablet Take 1 tablet by mouth 2 (two) times daily.  ? cholecalciferol (VITAMIN D) 1000 units tablet Take 1,000 Units by mouth daily.  ? docusate sodium (COLACE) 100 MG capsule Take 100 mg by mouth 2 (two) times daily.  ? donepezil (ARICEPT) 10 MG tablet Take one tablet, 10 mg daily  ? feeding supplement (ENSURE ENLIVE / ENSURE PLUS) LIQD Take 237 mLs by mouth 2 (two) times daily between meals.  ? GOODSENSE ARTIFICIAL TEARS 0.5-0.6 % SOLN Apply to eye.  ? guaiFENesin (MUCUS RELIEF ADULT PO) Take by mouth.  ? Lactobacillus-Inulin (CULTURELLE DIGESTIVE DAILY PO) Take by mouth daily.  ? levothyroxine (SYNTHROID) 125 MCG tablet TAKE (1) TABLET BY MOUTH ONCE DAILY ON AN EMPTY STOMACH.  ? lidocaine-prilocaine (EMLA) cream Apply 1 application topically as needed.  ? loperamide (IMODIUM) 2 MG capsule Take by mouth as needed for diarrhea or loose  stools.  ? NON FORMULARY Diet NAS  ? OXYGEN Inhale 2 L into the lungs continuous.  ? predniSONE (DELTASONE) 10 MG tablet Take 10 mg by mouth daily. For 3 days then decrease to one tablet every 3 days until go

## 2021-06-27 ENCOUNTER — Ambulatory Visit (INDEPENDENT_AMBULATORY_CARE_PROVIDER_SITE_OTHER): Payer: PPO | Admitting: Physician Assistant

## 2021-06-27 ENCOUNTER — Encounter: Payer: Self-pay | Admitting: Physician Assistant

## 2021-06-27 VITALS — BP 140/60 | HR 56 | Wt 165.0 lb

## 2021-06-27 DIAGNOSIS — G3184 Mild cognitive impairment, so stated: Secondary | ICD-10-CM

## 2021-06-27 NOTE — Patient Instructions (Signed)
It was a pleasure to see you today at our office.  ? ?Recommendations: ? ?Follow up in  6 months ?Continue donepezil 10 mg daily. Side effects were discussed  ? ? ?RECOMMENDATIONS FOR ALL PATIENTS WITH MEMORY PROBLEMS: ?1. Continue to exercise (Recommend 30 minutes of walking everyday, or 3 hours every week) ?2. Increase social interactions - continue going to [place] and enjoy social gatherings with friends and family ?3. Eat healthy, avoid fried foods and eat more fruits and vegetables ?4. Maintain adequate blood pressure, blood sugar, and blood cholesterol level. Reducing the risk of stroke and cardiovascular disease also helps promoting better memory. ?5. Avoid stressful situations. Live a simple life and avoid aggravations. Organize your time and prepare for the next day in anticipation. ?6. Sleep well, avoid any interruptions of sleep and avoid any distractions in the bedroom that may interfere with adequate sleep quality ?7. Avoid sugar, avoid sweets as there is a strong link between excessive sugar intake, diabetes, and cognitive impairment ?We discussed the Mediterranean diet, which has been shown to help patients reduce the risk of progressive memory disorders and reduces cardiovascular risk. This includes eating fish, eat fruits and green leafy vegetables, nuts like almonds and hazelnuts, walnuts, and also use olive oil. Avoid fast foods and fried foods as much as possible. Avoid sweets and sugar as sugar use has been linked to worsening of memory function. ? ?There is always a concern of gradual progression of memory problems. If this is the case, then we may need to adjust level of care according to patient needs. Support, both to the patient and caregiver, should then be put into place.  ? ? ?FALL PRECAUTIONS: Be cautious when walking. Scan the area for obstacles that may increase the risk of trips and falls. When getting up in the mornings, sit up at the edge of the bed for a few minutes before getting  out of bed. Consider elevating the bed at the head end to avoid drop of blood pressure when getting up. Walk always in a well-lit room (use night lights in the walls). Avoid area rugs or power cords from appliances in the middle of the walkways. Use a walker or a cane if necessary and consider physical therapy for balance exercise. Get your eyesight checked regularly. ? ?FINANCIAL OVERSIGHT: Supervision, especially oversight when making financial decisions or transactions is also recommended. ? ?HOME SAFETY: Consider the safety of the kitchen when operating appliances like stoves, microwave oven, and blender. Consider having supervision and share cooking responsibilities until no longer able to participate in those. Accidents with firearms and other hazards in the house should be identified and addressed as well. ? ? ?ABILITY TO BE LEFT ALONE: If patient is unable to contact 911 operator, consider using LifeLine, or when the need is there, arrange for someone to stay with patients. Smoking is a fire hazard, consider supervision or cessation. Risk of wandering should be assessed by caregiver and if detected at any point, supervision and safe proof recommendations should be instituted. ? ?MEDICATION SUPERVISION: Inability to self-administer medication needs to be constantly addressed. Implement a mechanism to ensure safe administration of the medications. ? ?  ?

## 2021-06-29 ENCOUNTER — Encounter: Payer: Self-pay | Admitting: Internal Medicine

## 2021-07-06 DIAGNOSIS — J449 Chronic obstructive pulmonary disease, unspecified: Secondary | ICD-10-CM | POA: Diagnosis not present

## 2021-07-11 ENCOUNTER — Emergency Department (HOSPITAL_COMMUNITY)
Admission: EM | Admit: 2021-07-11 | Discharge: 2021-07-12 | Disposition: A | Discharge status: Skilled Nursing Facility | Payer: PPO | Attending: Emergency Medicine | Admitting: Emergency Medicine

## 2021-07-11 ENCOUNTER — Emergency Department (HOSPITAL_COMMUNITY): Payer: PPO

## 2021-07-11 ENCOUNTER — Other Ambulatory Visit: Payer: Self-pay

## 2021-07-11 ENCOUNTER — Encounter (HOSPITAL_COMMUNITY): Payer: Self-pay | Admitting: Emergency Medicine

## 2021-07-11 DIAGNOSIS — Z79899 Other long term (current) drug therapy: Secondary | ICD-10-CM | POA: Diagnosis not present

## 2021-07-11 DIAGNOSIS — Z20822 Contact with and (suspected) exposure to covid-19: Secondary | ICD-10-CM | POA: Insufficient documentation

## 2021-07-11 DIAGNOSIS — R0602 Shortness of breath: Secondary | ICD-10-CM | POA: Insufficient documentation

## 2021-07-11 DIAGNOSIS — R069 Unspecified abnormalities of breathing: Secondary | ICD-10-CM | POA: Diagnosis not present

## 2021-07-11 DIAGNOSIS — R52 Pain, unspecified: Secondary | ICD-10-CM | POA: Diagnosis not present

## 2021-07-11 DIAGNOSIS — J449 Chronic obstructive pulmonary disease, unspecified: Secondary | ICD-10-CM | POA: Diagnosis not present

## 2021-07-11 DIAGNOSIS — J189 Pneumonia, unspecified organism: Secondary | ICD-10-CM | POA: Diagnosis not present

## 2021-07-11 DIAGNOSIS — J441 Chronic obstructive pulmonary disease with (acute) exacerbation: Secondary | ICD-10-CM | POA: Diagnosis not present

## 2021-07-11 DIAGNOSIS — I10 Essential (primary) hypertension: Secondary | ICD-10-CM | POA: Insufficient documentation

## 2021-07-11 DIAGNOSIS — R059 Cough, unspecified: Secondary | ICD-10-CM | POA: Diagnosis not present

## 2021-07-11 LAB — BASIC METABOLIC PANEL
Anion gap: 6 (ref 5–15)
BUN: 18 mg/dL (ref 8–23)
CO2: 30 mmol/L (ref 22–32)
Calcium: 9 mg/dL (ref 8.9–10.3)
Chloride: 100 mmol/L (ref 98–111)
Creatinine, Ser: 0.8 mg/dL (ref 0.61–1.24)
GFR, Estimated: >60 mL/min (ref >60)
Glucose, Bld: 128 mg/dL — ABNORMAL HIGH (ref 70–99)
Potassium: 4.1 mmol/L (ref 3.5–5.1)
Sodium: 136 mmol/L (ref 135–145)

## 2021-07-11 LAB — BRAIN NATRIURETIC PEPTIDE: B Natriuretic Peptide: 232 pg/mL — ABNORMAL HIGH (ref 0.0–100.0)

## 2021-07-11 LAB — CBC
HCT: 35 % — ABNORMAL LOW (ref 39.0–52.0)
Hemoglobin: 11.4 g/dL — ABNORMAL LOW (ref 13.0–17.0)
MCH: 33 pg (ref 26.0–34.0)
MCHC: 32.6 g/dL (ref 30.0–36.0)
MCV: 101.4 fL — ABNORMAL HIGH (ref 80.0–100.0)
Platelets: 125 10*3/uL — ABNORMAL LOW (ref 150–400)
RBC: 3.45 MIL/uL — ABNORMAL LOW (ref 4.22–5.81)
RDW: 14.4 % (ref 11.5–15.5)
WBC: 11.5 10*3/uL — ABNORMAL HIGH (ref 4.0–10.5)
nRBC: 0 % (ref 0.0–0.2)

## 2021-07-11 LAB — HEPATIC FUNCTION PANEL
ALT: 13 U/L (ref 0–44)
AST: 17 U/L (ref 15–41)
Albumin: 3.8 g/dL (ref 3.5–5.0)
Alkaline Phosphatase: 75 U/L (ref 38–126)
Bilirubin, Direct: 0.2 mg/dL (ref 0.0–0.2)
Indirect Bilirubin: 0.9 mg/dL (ref 0.3–0.9)
Total Bilirubin: 1.1 mg/dL (ref 0.3–1.2)
Total Protein: 7 g/dL (ref 6.5–8.1)

## 2021-07-11 LAB — TROPONIN I (HIGH SENSITIVITY)
Troponin I (High Sensitivity): 10 ng/L (ref <18)
Troponin I (High Sensitivity): 10 ng/L (ref <18)

## 2021-07-11 LAB — RESP PANEL BY RT-PCR (FLU A&B, COVID) ARPGX2
Influenza A by PCR: NEGATIVE
Influenza B by PCR: NEGATIVE
SARS Coronavirus 2 by RT PCR: NEGATIVE

## 2021-07-11 MED ORDER — METHYLPREDNISOLONE 4 MG PO TBPK: ORAL_TABLET | ORAL | 0 refills | Status: DC

## 2021-07-11 MED ORDER — DOXYCYCLINE HYCLATE 100 MG PO TABS
100.0000 mg | ORAL_TABLET | Freq: Once | ORAL | Status: AC
  Administered 2021-07-11: 100 mg via ORAL
  Filled 2021-07-11: qty 1

## 2021-07-11 MED ORDER — DOXYCYCLINE HYCLATE 100 MG PO CAPS: 100.0000 mg | ORAL_CAPSULE | Freq: Two times a day (BID) | ORAL | 0 refills | Status: DC

## 2021-07-11 NOTE — Discharge Instructions (Signed)
You have been seen and discharged from the emergency department.  I believe you are suffering from a COPD exacerbation.  Take antibiotic/steroids as directed.  Follow-up with your primary provider for further evaluation and further care. Take home medications as prescribed. If you have any worsening symptoms, chest/back pain, difficulty breathing or further concerns for your health please return to an emergency department for further evaluation. ?

## 2021-07-11 NOTE — ED Notes (Signed)
Notified [facility] of patient needing transportation back to [facility]. ?

## 2021-07-11 NOTE — ED Triage Notes (Signed)
Pt to the ED with [facility] from [facility] in [place]. ? ?The patients complains of shortness of breath, a dry cough and fevers. ? ?The pt wanted to go to the doctor but could not be worked into the schedule today. ? ?The pt wears 2 L [place] at baseline due to COPD. ? ?POC requests evaluation for PNU and has already been on antibiotics and prednisone for the past 2 weeks. ? ?

## 2021-07-11 NOTE — ED Provider Notes (Addendum)
?[facility] ?Provider Note ? ? ?CSN: [id number] ?Arrival date & time: 07/11/21  1403 ? ?  ? ?History ? ?Chief Complaint  ?Patient presents with  ? Shortness of Breath  ? ? ?Ian Aguilar is a 81 y.o. male. ? ?HPI ? ?81 year old male with past medical history of HTN, HLD, COPD on nasal cannula at baseline presents emergency department with cough, shortness of breath.  Patient states for the past couple days he has been having shortness of breath, wet cough that he cannot produce from from, intermittent chills.  He was treated for respiratory infection about 3 weeks with steroids and antibiotics, has improvement but return of symptoms this past week. He comes from living facility, attempted to get into his primary doctor but was unsuccessful today.  Still wearing his baseline 2 L nasal cannula without any difficulty.  Denies any swelling of his abdomen/lower extremities.  No chest or back pain. ? ?Home Medications ?Prior to Admission medications   ?Medication Sig Start Date End Date Taking? Authorizing Provider  ?albuterol (VENTOLIN HFA) 108 (90 Base) MCG/ACT inhaler Inhale 90 mcg into the lungs 4 (four) times daily as needed. 06/16/21   [provider]  ?amoxicillin-clavulanate (AUGMENTIN) 875-125 MG tablet Take 1 tablet by mouth 2 (two) times daily. 06/16/21   [provider]  ?cholecalciferol (VITAMIN D) 1000 units tablet Take 1,000 Units by mouth daily.    [provider]  ?docusate sodium (COLACE) 100 MG capsule Take 100 mg by mouth 2 (two) times daily.    [provider]  ?donepezil (ARICEPT) 10 MG tablet Take one tablet, 10 mg daily 12/28/20   Rondel Jumbo, PA-C  ?feeding supplement (ENSURE ENLIVE / ENSURE PLUS) LIQD Take 237 mLs by mouth 2 (two) times daily between meals. 04/27/20   [provider]  ?gabapentin (NEURONTIN) 100 MG capsule Take 1 capsule (100 mg total) by mouth 3 (three) times daily for 14 days. ?Patient not taking: Reported on  06/27/2021 10/06/20 10/20/20  Carole Civil, MD  ?GOODSENSE ARTIFICIAL TEARS 0.5-0.6 % SOLN Apply to eye. 05/24/20   [provider]  ?guaiFENesin (MUCUS RELIEF ADULT PO) Take by mouth.    [provider]  ?Lactobacillus-Inulin (CULTURELLE DIGESTIVE DAILY PO) Take by mouth daily.    [provider]  ?levothyroxine (SYNTHROID) 125 MCG tablet TAKE (1) TABLET BY MOUTH ONCE DAILY ON AN EMPTY STOMACH. 05/09/21   Derek Jack, MD  ?lidocaine-prilocaine (EMLA) cream Apply 1 application topically as needed. 04/20/21   Derek Jack, MD  ?loperamide (IMODIUM) 2 MG capsule Take by mouth as needed for diarrhea or loose stools.    [provider]  ?Baruch Gouty Diet NAS 04/23/20   [provider]  ?OXYGEN Inhale 2 L into the lungs continuous. 04/24/20   [provider]  ?predniSONE (DELTASONE) 10 MG tablet Take 10 mg by mouth daily. For 3 days then decrease to one tablet every 3 days until gone. 06/16/21   [provider]  ?Psyllium (METAMUCIL PO) Take by mouth. 1 capsule twice daily    [provider]  ?rosuvastatin (CRESTOR) 10 MG tablet Take 1 tablet (10 mg total) by mouth daily. 05/21/20   Gerlene Fee, NP  ?SPIRIVA RESPIMAT 2.5 MCG/ACT AERS Inhale 2 puffs into the lungs daily. 05/31/20   [provider]  ?triamcinolone (KENALOG) 0.025 % ointment Apply 1 application topically 2 (two) times daily. Apply to face and arms sparingly for dryness 05/24/20   [provider]  ?  vitamin B-12 (CYANOCOBALAMIN) 1000 MCG tablet Take 1 tablet (1,000 mcg total) by mouth daily. 02/10/21   Derek Jack, MD  ?   ? ?Allergies    ?Patient has no known allergies.   ? ?Review of Systems   ?Review of Systems  ?Constitutional:  Positive for chills and fatigue. Negative for fever.  ?Respiratory:  Positive for cough and shortness of breath. Negative for chest tightness.   ?Cardiovascular:  Negative for chest pain, palpitations and leg  swelling.  ?Gastrointestinal:  Negative for abdominal pain, diarrhea and vomiting.  ?Genitourinary:  Negative for flank pain.  ?Musculoskeletal:  Negative for back pain.  ?Skin:  Negative for rash.  ?Neurological:  Negative for headaches.  ? ?Physical Exam ?Updated Vital Signs ?BP (!) 155/57   Pulse 69   Resp 20   Ht 5' 11.5" (1.816 m)   Wt 78.8 kg   SpO2 92%   BMI 23.91 kg/m?  ?Physical Exam ?Vitals and nursing note reviewed.  ?Constitutional:   ?   General: He is not in acute distress. ?   Appearance: Normal appearance.  ?HENT:  ?   Head: Normocephalic.  ?   Mouth/Throat:  ?   Mouth: Mucous membranes are moist.  ?Cardiovascular:  ?   Rate and Rhythm: Normal rate.  ?Pulmonary:  ?   Effort: Pulmonary effort is normal. No tachypnea, accessory muscle usage or respiratory distress.  ?   Breath sounds: Examination of the right-lower field reveals decreased breath sounds. Examination of the left-lower field reveals decreased breath sounds. Decreased breath sounds and rhonchi present.  ?   Comments: Wet cough without phlegm production ?Abdominal:  ?   Palpations: Abdomen is soft.  ?   Tenderness: There is no abdominal tenderness.  ?Musculoskeletal:  ?   Right lower leg: No edema.  ?   Left lower leg: No edema.  ?Skin: ?   General: Skin is warm.  ?Neurological:  ?   Mental Status: He is alert and oriented to person, place, and time. Mental status is at baseline.  ?Psychiatric:     ?   Mood and Affect: Mood normal.  ? ? ?ED Results / Procedures / Treatments   ?Labs ?(all labs ordered are listed, but only abnormal results are displayed) ?Labs Reviewed  ?CBC - Abnormal; Notable for the following components:  ?    Result Value  ? WBC 11.5 (*)   ? RBC 3.45 (*)   ? Hemoglobin 11.4 (*)   ? HCT 35.0 (*)   ? MCV 101.4 (*)   ? Platelets 125 (*)   ? All other components within normal limits  ?RESP PANEL BY RT-PCR (FLU A&B, COVID) ARPGX2  ?BASIC METABOLIC PANEL  ?BRAIN NATRIURETIC PEPTIDE  ?HEPATIC FUNCTION PANEL  ?TROPONIN I  (HIGH SENSITIVITY)  ? ? ?EKG ?EKG Interpretation ? ?Date/Time:  Monday Jul 11 2021 14:16:15 EDT ?Ventricular Rate:  81 ?PR Interval:  200 ?QRS Duration: 128 ?QT Interval:  400 ?QTC Calculation: 465 ?R Axis:   85 ?Text Interpretation: Sinus rhythm Right bundle branch block Similar to previous Confirmed by Lavenia Atlas [phone]) on 07/11/2021 3:18:19 PM ? ?Radiology ?No results found. ? ?Procedures ?Procedures  ? ? ?Medications Ordered in ED ?Medications - No data to display ? ?ED Course/ Medical Decision Making/ A&P ?  ?                        ?Medical Decision Making ?Amount and/or Complexity of Data Reviewed ?Labs: ordered. ? ?  Risk ?Prescription drug management. ? ? ?81 year old male presents emergency department with wet cough without phlegm production.  A couple weeks ago was diagnosed with an upper respiratory infection and completed treatment.  Has been doing well for the past couple weeks except the last few days has had a wet cough, has a difficult time producing phlegm.  Endorses chills, shortness of breath and fatigue at home.  No active chest pain/back pain.  No swelling of the lower extremities. ? ?Patient is flu/COVID-negative.  No pneumonia on chest x-ray, chronic changes.  EKG shows right bundle which is unchanged for the patient.  Blood work is reassuring, troponins are negative, BNP is slightly elevated, no findings of CHF exacerbation.  Low suspicion for PE at this time given wet cough, he is on his home oxygen required without any hypoxia.  He has a wet cough on exam, does not produce any phlegm.  Believe the patient is suffering from a COPD exacerbation and will treat conservatively as such.  No hypoxia or indication for admission.  Patient at this time appears safe and stable for discharge and close outpatient follow up. Discharge plan and strict return to ED precautions discussed, patient verbalizes understanding and agreement. ? ? ? ? ? ? ? ?Final Clinical Impression(s) / ED Diagnoses ?Final  diagnoses:  ?None  ? ? ?Rx / DC Orders ?ED Discharge Orders   ? ? None  ? ?  ? ? ?  ?Lorelle Gibbs, DO ?07/11/21 2121 ? ?  ?Lorelle Gibbs, DO ?07/11/21 2220 ? ?

## 2021-07-12 NOTE — ED Notes (Addendum)
Per Night EMT, report was called to facility and all paperwork provided to EMS by this Probation officer.  Pt attempted to sign for discharge, but pad would not work.  ?

## 2021-07-15 DIAGNOSIS — M169 Osteoarthritis of hip, unspecified: Secondary | ICD-10-CM | POA: Diagnosis not present

## 2021-07-15 DIAGNOSIS — R627 Adult failure to thrive: Secondary | ICD-10-CM | POA: Diagnosis not present

## 2021-07-15 DIAGNOSIS — Z6822 Body mass index (BMI) 22.0-22.9, adult: Secondary | ICD-10-CM | POA: Diagnosis not present

## 2021-07-15 DIAGNOSIS — Z1331 Encounter for screening for depression: Secondary | ICD-10-CM | POA: Diagnosis not present

## 2021-07-15 DIAGNOSIS — J441 Chronic obstructive pulmonary disease with (acute) exacerbation: Secondary | ICD-10-CM | POA: Diagnosis not present

## 2021-07-18 DIAGNOSIS — J449 Chronic obstructive pulmonary disease, unspecified: Secondary | ICD-10-CM | POA: Diagnosis not present

## 2021-07-18 DIAGNOSIS — D692 Other nonthrombocytopenic purpura: Secondary | ICD-10-CM | POA: Diagnosis not present

## 2021-07-18 DIAGNOSIS — Z6822 Body mass index (BMI) 22.0-22.9, adult: Secondary | ICD-10-CM | POA: Diagnosis not present

## 2021-07-18 DIAGNOSIS — E039 Hypothyroidism, unspecified: Secondary | ICD-10-CM | POA: Diagnosis not present

## 2021-07-18 DIAGNOSIS — M169 Osteoarthritis of hip, unspecified: Secondary | ICD-10-CM | POA: Diagnosis not present

## 2021-07-18 DIAGNOSIS — Z9981 Dependence on supplemental oxygen: Secondary | ICD-10-CM | POA: Diagnosis not present

## 2021-07-18 DIAGNOSIS — Z515 Encounter for palliative care: Secondary | ICD-10-CM | POA: Diagnosis not present

## 2021-07-18 DIAGNOSIS — I10 Essential (primary) hypertension: Secondary | ICD-10-CM | POA: Diagnosis not present

## 2021-07-18 DIAGNOSIS — Z79890 Hormone replacement therapy (postmenopausal): Secondary | ICD-10-CM | POA: Diagnosis not present

## 2021-07-18 DIAGNOSIS — F17210 Nicotine dependence, cigarettes, uncomplicated: Secondary | ICD-10-CM | POA: Diagnosis not present

## 2021-07-18 DIAGNOSIS — E785 Hyperlipidemia, unspecified: Secondary | ICD-10-CM | POA: Diagnosis not present

## 2021-07-18 DIAGNOSIS — J441 Chronic obstructive pulmonary disease with (acute) exacerbation: Secondary | ICD-10-CM | POA: Diagnosis not present

## 2021-07-18 DIAGNOSIS — Z792 Long term (current) use of antibiotics: Secondary | ICD-10-CM | POA: Diagnosis not present

## 2021-07-18 DIAGNOSIS — R627 Adult failure to thrive: Secondary | ICD-10-CM | POA: Diagnosis not present

## 2021-07-20 DIAGNOSIS — C3490 Malignant neoplasm of unspecified part of unspecified bronchus or lung: Secondary | ICD-10-CM | POA: Diagnosis not present

## 2021-07-20 DIAGNOSIS — J449 Chronic obstructive pulmonary disease, unspecified: Secondary | ICD-10-CM | POA: Diagnosis not present

## 2021-07-20 DIAGNOSIS — J439 Emphysema, unspecified: Secondary | ICD-10-CM | POA: Diagnosis not present

## 2021-07-20 DIAGNOSIS — R531 Weakness: Secondary | ICD-10-CM | POA: Diagnosis not present

## 2021-07-28 DIAGNOSIS — J441 Chronic obstructive pulmonary disease with (acute) exacerbation: Secondary | ICD-10-CM | POA: Diagnosis not present

## 2021-07-28 DIAGNOSIS — M169 Osteoarthritis of hip, unspecified: Secondary | ICD-10-CM | POA: Diagnosis not present

## 2021-07-28 DIAGNOSIS — I10 Essential (primary) hypertension: Secondary | ICD-10-CM | POA: Diagnosis not present

## 2021-07-28 DIAGNOSIS — R627 Adult failure to thrive: Secondary | ICD-10-CM | POA: Diagnosis not present

## 2021-07-28 DIAGNOSIS — Z792 Long term (current) use of antibiotics: Secondary | ICD-10-CM | POA: Diagnosis not present

## 2021-07-28 DIAGNOSIS — E785 Hyperlipidemia, unspecified: Secondary | ICD-10-CM | POA: Diagnosis not present

## 2021-07-28 DIAGNOSIS — Z6822 Body mass index (BMI) 22.0-22.9, adult: Secondary | ICD-10-CM | POA: Diagnosis not present

## 2021-07-28 DIAGNOSIS — Z9981 Dependence on supplemental oxygen: Secondary | ICD-10-CM | POA: Diagnosis not present

## 2021-08-03 ENCOUNTER — Other Ambulatory Visit: Payer: Self-pay | Admitting: Nurse Practitioner

## 2021-08-06 DIAGNOSIS — J449 Chronic obstructive pulmonary disease, unspecified: Secondary | ICD-10-CM | POA: Diagnosis not present

## 2021-08-31 DIAGNOSIS — R627 Adult failure to thrive: Secondary | ICD-10-CM | POA: Diagnosis not present

## 2021-08-31 DIAGNOSIS — J441 Chronic obstructive pulmonary disease with (acute) exacerbation: Secondary | ICD-10-CM | POA: Diagnosis not present

## 2021-08-31 DIAGNOSIS — Z6822 Body mass index (BMI) 22.0-22.9, adult: Secondary | ICD-10-CM | POA: Diagnosis not present

## 2021-08-31 DIAGNOSIS — E785 Hyperlipidemia, unspecified: Secondary | ICD-10-CM | POA: Diagnosis not present

## 2021-08-31 DIAGNOSIS — Z9981 Dependence on supplemental oxygen: Secondary | ICD-10-CM | POA: Diagnosis not present

## 2021-08-31 DIAGNOSIS — I10 Essential (primary) hypertension: Secondary | ICD-10-CM | POA: Diagnosis not present

## 2021-08-31 DIAGNOSIS — M169 Osteoarthritis of hip, unspecified: Secondary | ICD-10-CM | POA: Diagnosis not present

## 2021-09-01 ENCOUNTER — Other Ambulatory Visit: Payer: Self-pay | Admitting: Physician Assistant

## 2021-09-14 ENCOUNTER — Other Ambulatory Visit: Payer: Self-pay | Admitting: *Deleted

## 2021-09-14 NOTE — Patient Outreach (Signed)
[facility]) Care Management  09/14/2021  BRODEE MAURITZ [birth year] [phone]  Nurse called patient to discuss [facility] program. Nurse answered the phone and stated that this nurse contacted [facility] of which the patient resides.   Emelia Loron RN, BSN [place] [phone] [email or web address]

## 2021-09-22 ENCOUNTER — Encounter (HOSPITAL_COMMUNITY): Payer: PPO

## 2021-09-23 ENCOUNTER — Inpatient Hospital Stay (HOSPITAL_COMMUNITY): Payer: PPO | Attending: Hematology

## 2021-09-23 VITALS — BP 169/76 | HR 53 | Temp 97.1°F | Resp 18

## 2021-09-23 DIAGNOSIS — Z95828 Presence of other vascular implants and grafts: Secondary | ICD-10-CM

## 2021-09-23 DIAGNOSIS — C3412 Malignant neoplasm of upper lobe, left bronchus or lung: Secondary | ICD-10-CM | POA: Insufficient documentation

## 2021-09-23 DIAGNOSIS — Z452 Encounter for adjustment and management of vascular access device: Secondary | ICD-10-CM | POA: Diagnosis not present

## 2021-09-23 DIAGNOSIS — Z87891 Personal history of nicotine dependence: Secondary | ICD-10-CM | POA: Insufficient documentation

## 2021-09-23 MED ORDER — HEPARIN SOD (PORK) LOCK FLUSH 100 UNIT/ML IV SOLN
500.0000 [IU] | Freq: Once | INTRAVENOUS | Status: AC
  Administered 2021-09-23: 500 [IU] via INTRAVENOUS

## 2021-09-23 MED ORDER — SODIUM CHLORIDE 0.9% FLUSH
10.0000 mL | INTRAVENOUS | Status: DC | PRN
  Administered 2021-09-23: 10 mL via INTRAVENOUS

## 2021-09-23 NOTE — Progress Notes (Signed)
Patients port flushed without difficulty.  Good blood return noted with no bruising or swelling noted at site.  Band aid applied.  VSS with discharge and left in satisfactory condition with no s/s of distress noted.   

## 2021-09-23 NOTE — Patient Instructions (Signed)
[facility]  Discharge Instructions: Thank you for choosing [facility] to provide your oncology and hematology care.  If you have a lab appointment with the Cancer Center, please come in thru the Main Entrance and check in at the main information desk.  Wear comfortable clothing and clothing appropriate for easy access to any Portacath or PICC line.   We strive to give you quality time with your provider. You may need to reschedule your appointment if you arrive late (15 or more minutes).  Arriving late affects you and other patients whose appointments are after yours.  Also, if you miss three or more appointments without notifying the office, you may be dismissed from the clinic at the provider's discretion.      For prescription refill requests, have your pharmacy contact our office and allow 72 hours for refills to be completed.         To help prevent nausea and vomiting after your treatment, we encourage you to take your nausea medication as directed.  BELOW ARE SYMPTOMS THAT SHOULD BE REPORTED IMMEDIATELY: *FEVER GREATER THAN 100.4 F (38 C) OR HIGHER *CHILLS OR SWEATING *NAUSEA AND VOMITING THAT IS NOT CONTROLLED WITH YOUR NAUSEA MEDICATION *UNUSUAL SHORTNESS OF BREATH *UNUSUAL BRUISING OR BLEEDING *URINARY PROBLEMS (pain or burning when urinating, or frequent urination) *BOWEL PROBLEMS (unusual diarrhea, constipation, pain near the anus) TENDERNESS IN MOUTH AND THROAT WITH OR WITHOUT PRESENCE OF ULCERS (sore throat, sores in mouth, or a toothache) UNUSUAL RASH, SWELLING OR PAIN  UNUSUAL VAGINAL DISCHARGE OR ITCHING   Items with * indicate a potential emergency and should be followed up as soon as possible or go to the Emergency Department if any problems should occur.  Please show the CHEMOTHERAPY ALERT CARD or IMMUNOTHERAPY ALERT CARD at check-in to the Emergency Department and triage nurse.  Should you have questions after your visit or need to  cancel or reschedule your appointment, please contact [facility] [phone]  and follow the prompts.  Office hours are 8:00 a.m. to 4:30 p.m. Monday - Friday. Please note that voicemails left after 4:00 p.m. may not be returned until the following business day.  We are closed weekends and major holidays. You have access to a nurse at all times for urgent questions. Please call the main number to the clinic [phone] and follow the prompts.  For any non-urgent questions, you may also contact your provider using MyChart. We now offer e-Visits for anyone 18 and older to request care online for non-urgent symptoms. For details visit [email or web address].   Also download the MyChart app! Go to the app store, search "MyChart", open the app, select [facility], and log in with your MyChart username and password.  Masks are optional in the cancer centers. If you would like for your care team to wear a mask while they are taking care of you, please let them know. For doctor visits, patients may have with them one support person who is at least 80 years old. At this time, visitors are not allowed in the infusion area.  

## 2021-09-26 DIAGNOSIS — I10 Essential (primary) hypertension: Secondary | ICD-10-CM | POA: Diagnosis not present

## 2021-09-26 DIAGNOSIS — R627 Adult failure to thrive: Secondary | ICD-10-CM | POA: Diagnosis not present

## 2021-09-26 DIAGNOSIS — Z9981 Dependence on supplemental oxygen: Secondary | ICD-10-CM | POA: Diagnosis not present

## 2021-09-26 DIAGNOSIS — J441 Chronic obstructive pulmonary disease with (acute) exacerbation: Secondary | ICD-10-CM | POA: Diagnosis not present

## 2021-09-27 DIAGNOSIS — R627 Adult failure to thrive: Secondary | ICD-10-CM | POA: Diagnosis not present

## 2021-09-27 DIAGNOSIS — J441 Chronic obstructive pulmonary disease with (acute) exacerbation: Secondary | ICD-10-CM | POA: Diagnosis not present

## 2021-09-27 DIAGNOSIS — Z9981 Dependence on supplemental oxygen: Secondary | ICD-10-CM | POA: Diagnosis not present

## 2021-09-27 DIAGNOSIS — I10 Essential (primary) hypertension: Secondary | ICD-10-CM | POA: Diagnosis not present

## 2021-09-27 DIAGNOSIS — Z6822 Body mass index (BMI) 22.0-22.9, adult: Secondary | ICD-10-CM | POA: Diagnosis not present

## 2021-09-27 DIAGNOSIS — E785 Hyperlipidemia, unspecified: Secondary | ICD-10-CM | POA: Diagnosis not present

## 2021-09-27 DIAGNOSIS — M169 Osteoarthritis of hip, unspecified: Secondary | ICD-10-CM | POA: Diagnosis not present

## 2021-09-30 DIAGNOSIS — R040 Epistaxis: Secondary | ICD-10-CM | POA: Diagnosis not present

## 2021-10-10 DIAGNOSIS — R040 Epistaxis: Secondary | ICD-10-CM | POA: Diagnosis not present

## 2021-10-17 DIAGNOSIS — M6281 Muscle weakness (generalized): Secondary | ICD-10-CM | POA: Diagnosis not present

## 2021-10-17 DIAGNOSIS — R2689 Other abnormalities of gait and mobility: Secondary | ICD-10-CM | POA: Diagnosis not present

## 2021-10-19 DIAGNOSIS — R2689 Other abnormalities of gait and mobility: Secondary | ICD-10-CM | POA: Diagnosis not present

## 2021-10-19 DIAGNOSIS — M6281 Muscle weakness (generalized): Secondary | ICD-10-CM | POA: Diagnosis not present

## 2021-10-24 DIAGNOSIS — M6281 Muscle weakness (generalized): Secondary | ICD-10-CM | POA: Diagnosis not present

## 2021-10-24 DIAGNOSIS — R2689 Other abnormalities of gait and mobility: Secondary | ICD-10-CM | POA: Diagnosis not present

## 2021-10-26 DIAGNOSIS — R2689 Other abnormalities of gait and mobility: Secondary | ICD-10-CM | POA: Diagnosis not present

## 2021-10-26 DIAGNOSIS — M6281 Muscle weakness (generalized): Secondary | ICD-10-CM | POA: Diagnosis not present

## 2021-10-31 DIAGNOSIS — M6281 Muscle weakness (generalized): Secondary | ICD-10-CM | POA: Diagnosis not present

## 2021-10-31 DIAGNOSIS — R2689 Other abnormalities of gait and mobility: Secondary | ICD-10-CM | POA: Diagnosis not present

## 2021-11-04 DIAGNOSIS — R2689 Other abnormalities of gait and mobility: Secondary | ICD-10-CM | POA: Diagnosis not present

## 2021-11-04 DIAGNOSIS — M6281 Muscle weakness (generalized): Secondary | ICD-10-CM | POA: Diagnosis not present

## 2021-11-07 DIAGNOSIS — M6281 Muscle weakness (generalized): Secondary | ICD-10-CM | POA: Diagnosis not present

## 2021-11-07 DIAGNOSIS — R2689 Other abnormalities of gait and mobility: Secondary | ICD-10-CM | POA: Diagnosis not present

## 2021-11-10 DIAGNOSIS — M6281 Muscle weakness (generalized): Secondary | ICD-10-CM | POA: Diagnosis not present

## 2021-11-10 DIAGNOSIS — R2689 Other abnormalities of gait and mobility: Secondary | ICD-10-CM | POA: Diagnosis not present

## 2021-11-15 DIAGNOSIS — R040 Epistaxis: Secondary | ICD-10-CM | POA: Diagnosis not present

## 2021-11-16 DIAGNOSIS — R2689 Other abnormalities of gait and mobility: Secondary | ICD-10-CM | POA: Diagnosis not present

## 2021-11-16 DIAGNOSIS — M6281 Muscle weakness (generalized): Secondary | ICD-10-CM | POA: Diagnosis not present

## 2021-11-18 DIAGNOSIS — M6281 Muscle weakness (generalized): Secondary | ICD-10-CM | POA: Diagnosis not present

## 2021-11-18 DIAGNOSIS — R2689 Other abnormalities of gait and mobility: Secondary | ICD-10-CM | POA: Diagnosis not present

## 2021-11-21 DIAGNOSIS — M6281 Muscle weakness (generalized): Secondary | ICD-10-CM | POA: Diagnosis not present

## 2021-11-21 DIAGNOSIS — R2689 Other abnormalities of gait and mobility: Secondary | ICD-10-CM | POA: Diagnosis not present

## 2021-11-23 DIAGNOSIS — M6281 Muscle weakness (generalized): Secondary | ICD-10-CM | POA: Diagnosis not present

## 2021-11-23 DIAGNOSIS — R2689 Other abnormalities of gait and mobility: Secondary | ICD-10-CM | POA: Diagnosis not present

## 2021-11-28 DIAGNOSIS — M6281 Muscle weakness (generalized): Secondary | ICD-10-CM | POA: Diagnosis not present

## 2021-11-28 DIAGNOSIS — R2689 Other abnormalities of gait and mobility: Secondary | ICD-10-CM | POA: Diagnosis not present

## 2021-11-30 DIAGNOSIS — M6281 Muscle weakness (generalized): Secondary | ICD-10-CM | POA: Diagnosis not present

## 2021-11-30 DIAGNOSIS — R2689 Other abnormalities of gait and mobility: Secondary | ICD-10-CM | POA: Diagnosis not present

## 2021-12-05 DIAGNOSIS — R2689 Other abnormalities of gait and mobility: Secondary | ICD-10-CM | POA: Diagnosis not present

## 2021-12-05 DIAGNOSIS — M6281 Muscle weakness (generalized): Secondary | ICD-10-CM | POA: Diagnosis not present

## 2021-12-07 DIAGNOSIS — M6281 Muscle weakness (generalized): Secondary | ICD-10-CM | POA: Diagnosis not present

## 2021-12-07 DIAGNOSIS — R2689 Other abnormalities of gait and mobility: Secondary | ICD-10-CM | POA: Diagnosis not present

## 2021-12-08 DIAGNOSIS — E039 Hypothyroidism, unspecified: Secondary | ICD-10-CM | POA: Diagnosis not present

## 2021-12-08 DIAGNOSIS — I10 Essential (primary) hypertension: Secondary | ICD-10-CM | POA: Diagnosis not present

## 2021-12-08 DIAGNOSIS — J441 Chronic obstructive pulmonary disease with (acute) exacerbation: Secondary | ICD-10-CM | POA: Diagnosis not present

## 2021-12-08 DIAGNOSIS — F329 Major depressive disorder, single episode, unspecified: Secondary | ICD-10-CM | POA: Diagnosis not present

## 2021-12-08 DIAGNOSIS — Z0001 Encounter for general adult medical examination with abnormal findings: Secondary | ICD-10-CM | POA: Diagnosis not present

## 2021-12-08 DIAGNOSIS — E782 Mixed hyperlipidemia: Secondary | ICD-10-CM | POA: Diagnosis not present

## 2021-12-08 DIAGNOSIS — F17200 Nicotine dependence, unspecified, uncomplicated: Secondary | ICD-10-CM | POA: Diagnosis not present

## 2021-12-08 DIAGNOSIS — E7849 Other hyperlipidemia: Secondary | ICD-10-CM | POA: Diagnosis not present

## 2021-12-08 DIAGNOSIS — Z23 Encounter for immunization: Secondary | ICD-10-CM | POA: Diagnosis not present

## 2021-12-08 DIAGNOSIS — Z6821 Body mass index (BMI) 21.0-21.9, adult: Secondary | ICD-10-CM | POA: Diagnosis not present

## 2021-12-08 DIAGNOSIS — Z9981 Dependence on supplemental oxygen: Secondary | ICD-10-CM | POA: Diagnosis not present

## 2021-12-08 DIAGNOSIS — Z1331 Encounter for screening for depression: Secondary | ICD-10-CM | POA: Diagnosis not present

## 2021-12-08 DIAGNOSIS — F419 Anxiety disorder, unspecified: Secondary | ICD-10-CM | POA: Diagnosis not present

## 2021-12-12 DIAGNOSIS — R2689 Other abnormalities of gait and mobility: Secondary | ICD-10-CM | POA: Diagnosis not present

## 2021-12-12 DIAGNOSIS — M6281 Muscle weakness (generalized): Secondary | ICD-10-CM | POA: Diagnosis not present

## 2021-12-14 DIAGNOSIS — R2689 Other abnormalities of gait and mobility: Secondary | ICD-10-CM | POA: Diagnosis not present

## 2021-12-14 DIAGNOSIS — M6281 Muscle weakness (generalized): Secondary | ICD-10-CM | POA: Diagnosis not present

## 2021-12-19 DIAGNOSIS — R2689 Other abnormalities of gait and mobility: Secondary | ICD-10-CM | POA: Diagnosis not present

## 2021-12-19 DIAGNOSIS — M6281 Muscle weakness (generalized): Secondary | ICD-10-CM | POA: Diagnosis not present

## 2021-12-21 DIAGNOSIS — M6281 Muscle weakness (generalized): Secondary | ICD-10-CM | POA: Diagnosis not present

## 2021-12-21 DIAGNOSIS — R2689 Other abnormalities of gait and mobility: Secondary | ICD-10-CM | POA: Diagnosis not present

## 2021-12-22 ENCOUNTER — Inpatient Hospital Stay: Payer: PPO | Attending: Hematology

## 2021-12-22 ENCOUNTER — Ambulatory Visit (HOSPITAL_COMMUNITY)
Admission: RE | Admit: 2021-12-22 | Discharge: 2021-12-22 | Disposition: A | Discharge status: Home / Self Care | Payer: PPO | Source: Ambulatory Visit | Attending: Hematology | Admitting: Hematology

## 2021-12-22 DIAGNOSIS — J439 Emphysema, unspecified: Secondary | ICD-10-CM | POA: Diagnosis not present

## 2021-12-22 DIAGNOSIS — Z87891 Personal history of nicotine dependence: Secondary | ICD-10-CM | POA: Insufficient documentation

## 2021-12-22 DIAGNOSIS — D649 Anemia, unspecified: Secondary | ICD-10-CM | POA: Diagnosis not present

## 2021-12-22 DIAGNOSIS — C3490 Malignant neoplasm of unspecified part of unspecified bronchus or lung: Secondary | ICD-10-CM | POA: Diagnosis not present

## 2021-12-22 DIAGNOSIS — R918 Other nonspecific abnormal finding of lung field: Secondary | ICD-10-CM | POA: Diagnosis not present

## 2021-12-22 DIAGNOSIS — C3411 Malignant neoplasm of upper lobe, right bronchus or lung: Secondary | ICD-10-CM | POA: Diagnosis not present

## 2021-12-22 DIAGNOSIS — C3491 Malignant neoplasm of unspecified part of right bronchus or lung: Secondary | ICD-10-CM | POA: Insufficient documentation

## 2021-12-22 DIAGNOSIS — E039 Hypothyroidism, unspecified: Secondary | ICD-10-CM | POA: Insufficient documentation

## 2021-12-22 LAB — CBC WITH DIFFERENTIAL/PLATELET
Abs Immature Granulocytes: 0.01 10*3/uL (ref 0.00–0.07)
Basophils Absolute: 0 10*3/uL (ref 0.0–0.1)
Basophils Relative: 1 %
Eosinophils Absolute: 0.2 10*3/uL (ref 0.0–0.5)
Eosinophils Relative: 5 %
HCT: 36.2 % — ABNORMAL LOW (ref 39.0–52.0)
Hemoglobin: 11.7 g/dL — ABNORMAL LOW (ref 13.0–17.0)
Immature Granulocytes: 0 %
Lymphocytes Relative: 16 %
Lymphs Abs: 0.7 10*3/uL (ref 0.7–4.0)
MCH: 31 pg (ref 26.0–34.0)
MCHC: 32.3 g/dL (ref 30.0–36.0)
MCV: 96 fL (ref 80.0–100.0)
Monocytes Absolute: 0.3 10*3/uL (ref 0.1–1.0)
Monocytes Relative: 7 %
Neutro Abs: 2.9 10*3/uL (ref 1.7–7.7)
Neutrophils Relative %: 71 %
Platelets: 167 10*3/uL (ref 150–400)
RBC: 3.77 MIL/uL — ABNORMAL LOW (ref 4.22–5.81)
RDW: 14.1 % (ref 11.5–15.5)
WBC: 4.1 10*3/uL (ref 4.0–10.5)
nRBC: 0 % (ref 0.0–0.2)

## 2021-12-22 LAB — COMPREHENSIVE METABOLIC PANEL
ALT: 11 U/L (ref 0–44)
AST: 18 U/L (ref 15–41)
Albumin: 3.9 g/dL (ref 3.5–5.0)
Alkaline Phosphatase: 69 U/L (ref 38–126)
Anion gap: 4 — ABNORMAL LOW (ref 5–15)
BUN: 19 mg/dL (ref 8–23)
CO2: 33 mmol/L — ABNORMAL HIGH (ref 22–32)
Calcium: 9.2 mg/dL (ref 8.9–10.3)
Chloride: 103 mmol/L (ref 98–111)
Creatinine, Ser: 1.03 mg/dL (ref 0.61–1.24)
GFR, Estimated: >60 mL/min (ref >60)
Glucose, Bld: 91 mg/dL (ref 70–99)
Potassium: 4.4 mmol/L (ref 3.5–5.1)
Sodium: 140 mmol/L (ref 135–145)
Total Bilirubin: 0.6 mg/dL (ref 0.3–1.2)
Total Protein: 6.9 g/dL (ref 6.5–8.1)

## 2021-12-22 LAB — MAGNESIUM: Magnesium: 2.1 mg/dL (ref 1.7–2.4)

## 2021-12-22 LAB — POCT I-STAT CREATININE: Creatinine, Ser: 1.2 mg/dL (ref 0.61–1.24)

## 2021-12-22 LAB — TSH: TSH: 0.086 u[IU]/mL — ABNORMAL LOW (ref 0.350–4.500)

## 2021-12-22 MED ORDER — IOHEXOL 300 MG/ML  SOLN
75.0000 mL | Freq: Once | INTRAMUSCULAR | Status: AC | PRN
  Administered 2021-12-22: 75 mL via INTRAVENOUS

## 2021-12-22 NOTE — Progress Notes (Signed)
Patients port flushed without difficulty.  Good blood return noted with no bruising or swelling noted at site.  Patient remains accessed for CT scan.

## 2021-12-26 DIAGNOSIS — R2689 Other abnormalities of gait and mobility: Secondary | ICD-10-CM | POA: Diagnosis not present

## 2021-12-26 DIAGNOSIS — M6281 Muscle weakness (generalized): Secondary | ICD-10-CM | POA: Diagnosis not present

## 2021-12-28 DIAGNOSIS — R2689 Other abnormalities of gait and mobility: Secondary | ICD-10-CM | POA: Diagnosis not present

## 2021-12-28 DIAGNOSIS — M6281 Muscle weakness (generalized): Secondary | ICD-10-CM | POA: Diagnosis not present

## 2021-12-29 ENCOUNTER — Inpatient Hospital Stay: Payer: PPO | Attending: Hematology | Admitting: Hematology

## 2021-12-29 ENCOUNTER — Ambulatory Visit: Payer: PPO | Admitting: Physician Assistant

## 2021-12-29 VITALS — BP 122/61 | HR 53 | Temp 97.4°F | Resp 17 | Ht 71.5 in | Wt 153.8 lb

## 2021-12-29 DIAGNOSIS — C3411 Malignant neoplasm of upper lobe, right bronchus or lung: Secondary | ICD-10-CM | POA: Diagnosis not present

## 2021-12-29 DIAGNOSIS — Z79899 Other long term (current) drug therapy: Secondary | ICD-10-CM | POA: Insufficient documentation

## 2021-12-29 DIAGNOSIS — D509 Iron deficiency anemia, unspecified: Secondary | ICD-10-CM | POA: Diagnosis not present

## 2021-12-29 DIAGNOSIS — C3491 Malignant neoplasm of unspecified part of right bronchus or lung: Secondary | ICD-10-CM | POA: Diagnosis not present

## 2021-12-29 NOTE — Progress Notes (Signed)
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 Chemoradiation with carboplatin and paclitaxel weekly from 06/11/2017 to 07/16/2017. 2. Consolidation with durvalumab from 09/19/2017 to 09/17/2018.  NGS Results: not done  CURRENT THERAPY: surveillance  BRIEF ONCOLOGIC HISTORY:  Oncology History  Carcinoma, lung ([place])  06/05/2017 Initial Diagnosis   Carcinoma, lung ([place])   06/11/2017 - 07/16/2017 Chemotherapy   The patient had dexamethasone (DECADRON) 4 MG tablet, 8 mg, Oral, Daily, 1 of 1 cycle, Start date: --, End date: -- palonosetron (ALOXI) injection 0.25 mg, 0.25 mg, Intravenous,  Once, 6 of 6 cycles Administration: 0.25 mg (06/11/2017), 0.25 mg (07/09/2017), 0.25 mg (07/16/2017), 0.25 mg (06/18/2017), 0.25 mg (06/25/2017), 0.25 mg (07/02/2017) CARBOplatin (PARAPLATIN) 190 mg in sodium chloride 0.9 % 250 mL chemo infusion, 190 mg (100 % of original dose 192.8 mg), Intravenous,  Once, 6 of 6 cycles Dose modification:   (original dose 192.8 mg, Cycle 1),   (original dose 192.8 mg, Cycle 5), 144.6 mg (original dose 144.6 mg, Cycle 6),   (original dose 192.8 mg, Cycle 2),   (original dose 192.8 mg, Cycle 3),   (original dose 192.8 mg, Cycle 4) Administration: 190 mg (06/11/2017), 190 mg (07/09/2017), 140 mg (07/16/2017), 190 mg (06/18/2017), 190 mg (06/25/2017), 190 mg (07/02/2017) PACLitaxel (TAXOL) 90 mg in dextrose 5 % 250 mL chemo infusion (</= 47m/m2), 45 mg/m2 = 90 mg, Intravenous,  Once, 6 of 6 cycles Dose modification: 40.5 mg/m2 (90 % of original dose 45 mg/m2, Cycle 5, Reason: Other (see comments), Comment: early neuropathy), 36 mg/m2 (80 % of original dose 45 mg/m2, Cycle 6, Reason: Provider Judgment) Administration: 90 mg (06/11/2017), 78 mg  (07/09/2017), 72 mg (07/16/2017), 90 mg (06/18/2017), 78 mg (06/25/2017), 78 mg (07/02/2017)  for chemotherapy treatment.    Malignant ne[place] of bronchus and lung ([facility] [place]  06/08/2017 Initial Diagnosis   [place]align[place] of [place]us and lung ([place]   09/20/2017 - 09/17/2018 Chemotherapy   Patient is on Treatment Plan : LUNG DURVALUMAB Q14D       CANCER STAGING:  Cancer Staging  No matching staging information was found for the patient.  INTERVAL HISTORY:  [name] a 81y.o. male, seen for follow-up of stage II lung cancer.  He reports energy levels of 70%.  Denies any new onset chest pains..Marland Kitchen REVIEW OF SYSTEMS:  Review of Systems  Constitutional:  Negative for appetite change and fatigue.  Respiratory:  Positive for shortness of breath (2L O2).   All other systems reviewed and are negative.   PAST MEDICAL/SURGICAL HISTORY:  Past Medical History:  Diagnosis Date   Anxiety    Arthritis    Cancer ([facility]    Cirrhosis ([facility]    confirmed by MRI on 07/04/11.     COPD (chronic obstructive pulmonary disease) (HCC)    Depression with anxiety    ETOH abuse    Hyperlipidemia    Hypertension    diet control   Nodule of upper lobe of right lung    with mediastinal adenopathy   Wears dentures    Wears glasses    Past Surgical History:  Procedure Laterality Date   bilateral cataract surgery     [place]   broken arm  6 yrs ago   left  otif of wrist-[place]   CLOSED REDUCTION WRIST FRACTURE Right 09/02/2015   Procedure: RIGHT WRIST REDUCTION;  Surgeon: Renette Butters, MD;  Location: [facility];  Service: Orthopedics;  Laterality: Right;  with MAC   COLONOSCOPY  1996   [facility]: external hemorrhoids, no polyps   COLONOSCOPY  06/28/2011   Dr. Ala Bent diverticulosis,tubular adenoma, hyperplastic polyp   COLONOSCOPY WITH PROPOFOL N/A 04/26/2017   Dr. Gala Romney: perianal and digital rectal examinations normal, diffusely congested colonic mucosa but otherwise normal    ESOPHAGOGASTRODUODENOSCOPY  06/28/2011   Dr. Chelsea Aus erosive reflux esophagitis, hiatal hernia-gastritis   ESOPHAGOGASTRODUODENOSCOPY (EGD) WITH PROPOFOL N/A 04/26/2017   Dr. Gala Romney: normal esophagus, small hiatal hernia, GAVE, portal hypertensive gastropathy, normal duodenal bulb and second portion, no specimens collected. 2 years screening    ESOPHAGOGASTRODUODENOSCOPY (EGD) WITH PROPOFOL N/A 06/19/2019   normal esophagus, portal gastropathy, GAVE, normal duodenum. 2 years screening.    EXTERNAL EAR SURGERY     right ear-cleaned out ear and created new eardrum   INGUINAL HERNIA REPAIR  2-3 yrs ago   left-[facility]_   INTRAMEDULLARY (IM) NAIL INTERTROCHANTERIC Right 09/02/2015   Procedure: RIGHT  INTERTROCHANTRIC HIP;  Surgeon: Renette Butters, MD;  Location: [facility];  Service: Orthopedics;  Laterality: Right;  With MAC   KNEE SURGERY     left-arthroscopy-[place]   PORTACATH PLACEMENT Left 06/08/2017   Procedure: INSERTION POWER PORT WITH  ATTACHED CATHETER LEFT SUBCLAVIAN;  Surgeon: Aviva Signs, MD;  Location[facility];  Service: General;  Laterality: Left;   SKULL FRACTURE ELEVATION     fractured cheeck bone repaired   TOTAL HIP ARTHROPLASTY  12/18/2011   Procedure: TOTAL HIP ARTHROPLASTY;  Surgeon: Carole Civil, MD;  Location: [facility];  Service: Orthopedics;  Laterality: Left;   VIDEO BRONCHOSCOPY WITH ENDOBRONCHIAL ULTRASOUND N/A 05/04/2017   Procedure: VIDEO BRONCHOSCOPY WITH ENDOBRONCHIAL ULTRASOUND;  Surgeon: Melrose Nakayama, MD;  Location: [facility];  Service: Thoracic;  Laterality: N/A;   WISDOM TOOTH EXTRACTION      SOCIAL HISTORY:  Social History   Socioeconomic History   Marital status: Widowed    Spouse name: Not on file   Number of children: 2   Years of education: 4   Highest education level: Not on file  Occupational History   Occupation: retired    Fish farm manager: [employer]  Tobacco Use   Smoking status: Former    Packs/day: 0.00    Years: 62.00     Total pack years: 0.00    Types: Cigarettes    Quit date: 02/28/2019    Years since quitting: 2.8   Smokeless tobacco: Never   Tobacco comments:    1/2 ppd > 60 years as of 06/27/17: 4-5 cigarettes or less a day  Vaping Use   Vaping Use: Never used  Substance and Sexual Activity   Alcohol use: Not Currently    Alcohol/week: 2.0 standard drinks of alcohol    Types: 2 Cans of beer per week   Drug use: No   Sexual activity: Yes    Birth control/protection: None  Other Topics Concern   Not on file  Social History Narrative   Right handed   Drinks caffeine   Assistant living   Social Determinants of Health   Financial Resource Strain: Not on file  Food Insecurity: Not on file  Transportation Needs: Not on file  Physical Activity: Not on file  Stress: Not on file  Social Connections: Not on file  Intimate Partner Violence: Not  on file    FAMILY HISTORY:  Family History  Problem Relation Age of Onset   Stroke Mother 42   Heart disease Father 28       MI   Diabetes Maternal Uncle    Colon cancer Neg Hx    Cancer Neg Hx     CURRENT MEDICATIONS:  Current Outpatient Medications  Medication Sig Dispense Refill   albuterol (VENTOLIN HFA) 108 (90 Base) MCG/ACT inhaler Inhale 90 mcg into the lungs 4 (four) times daily as needed.     cholecalciferol (VITAMIN D) 1000 units tablet Take 1,000 Units by mouth daily.     docusate sodium (COLACE) 100 MG capsule Take 100 mg by mouth 2 (two) times daily.     donepezil (ARICEPT) 10 MG tablet TAKE 1 TABLET BY MOUTH AT BEDTIME. 30 tablet 4   doxycycline (VIBRAMYCIN) 100 MG capsule Take 1 capsule (100 mg total) by mouth 2 (two) times daily. 20 capsule 0   feeding supplement (ENSURE ENLIVE / ENSURE PLUS) LIQD Take 237 mLs by mouth 2 (two) times daily between meals.     GOODSENSE ARTIFICIAL TEARS 0.5-0.6 % SOLN Apply to eye.     guaiFENesin (MUCUS RELIEF ADULT PO) Take by mouth.     Lactobacillus-Inulin (CULTURELLE DIGESTIVE DAILY PO) Take  by mouth daily.     levothyroxine (SYNTHROID) 125 MCG tablet TAKE (1) TABLET BY MOUTH ONCE DAILY ON AN EMPTY STOMACH. 30 tablet 5   lidocaine-prilocaine (EMLA) cream Apply 1 application topically as needed. 30 g 3   loperamide (IMODIUM) 2 MG capsule Take by mouth as needed for diarrhea or loose stools.     methylPREDNISolone (MEDROL DOSEPAK) 4 MG TBPK tablet Take as directed 1 each 0   NON FORMULARY Diet NAS     OXYGEN Inhale 2 L into the lungs continuous.     Psyllium (METAMUCIL PO) Take by mouth. 1 capsule twice daily     rosuvastatin (CRESTOR) 10 MG tablet Take 1 tablet (10 mg total) by mouth daily. 30 tablet 0   SPIRIVA RESPIMAT 2.5 MCG/ACT AERS Inhale 2 puffs into the lungs daily.     triamcinolone (KENALOG) 0.025 % ointment Apply 1 application topically 2 (two) times daily. Apply to face and arms sparingly for dryness     vitamin B-12 (CYANOCOBALAMIN) 1000 MCG tablet Take 1 tablet (1,000 mcg total) by mouth daily. 30 tablet 6   gabapentin (NEURONTIN) 100 MG capsule Take 1 capsule (100 mg total) by mouth 3 (three) times daily for 14 days. (Patient not taking: Reported on 06/27/2021) 42 capsule 0   No current facility-administered medications for this visit.    ALLERGIES:  No Known Allergies  PHYSICAL EXAM:  Performance status (ECOG): 1 - Symptomatic but completely ambulatory  Vitals:   12/29/21 1140  BP: 122/61  Pulse: (!) 53  Resp: 17  Temp: (!) 97.4 F (36.3 C)  SpO2: 95%   Wt Readings from Last 3 Encounters:  12/29/21 153 lb 12.8 oz (69.8 kg)  07/11/21 173 lb 13.1 oz (78.8 kg)  06/27/21 165 lb (74.8 kg)   Physical Exam Vitals reviewed.  Constitutional:      Appearance: Normal appearance.     Interventions: Nasal cannula in place.     Comments: In wheelchair  Cardiovascular:     Rate and Rhythm: Normal rate and regular rhythm.     Pulses: Normal pulses.     Heart sounds: Normal heart sounds.  Pulmonary:     Effort: Pulmonary effort is normal.  Breath sounds:  Normal breath sounds.  Neurological:     General: No focal deficit present.     Mental Status: He is alert and oriented to person, place, and time.  Psychiatric:        Mood and Affect: Mood normal.        Behavior: Behavior normal.     LABORATORY DATA:  I have reviewed the labs as listed.     Latest Ref Rng & Units 12/22/2021   12:15 PM 07/11/2021    4:00 PM 06/16/2021    2:20 PM  CBC  WBC 4.0 - 10.5 K/uL 4.1  11.5  3.8   Hemoglobin 13.0 - 17.0 g/dL 11.7  11.4  11.6   Hematocrit 39.0 - 52.0 % 36.2  35.0  36.6   Platelets 150 - 400 K/uL 167  125  153       Latest Ref Rng & Units 12/22/2021    1:01 PM 12/22/2021   12:15 PM 07/11/2021    4:37 PM  CMP  Glucose 70 - 99 mg/dL  91    BUN 8 - 23 mg/dL  19    Creatinine 0.61 - 1.24 mg/dL 1.20  1.03    Sodium 135 - 145 mmol/L  140    Potassium 3.5 - 5.1 mmol/L  4.4    Chloride 98 - 111 mmol/L  103    CO2 22 - 32 mmol/L  33    Calcium 8.9 - 10.3 mg/dL  9.2    Total Protein 6.5 - 8.1 g/dL  6.9  7.0   Total Bilirubin 0.3 - 1.2 mg/dL  0.6  1.1   Alkaline Phos 38 - 126 U/L  69  75   AST 15 - 41 U/L  18  17   ALT 0 - 44 U/L  11  13     DIAGNOSTIC IMAGING:  I have independently reviewed the scans and discussed with the patient. CT Chest W Contrast  Result Date: 12/25/2021 CLINICAL DATA:  Non-small-cell lung cancer. Restaging. * Tracking Code: BO * EXAM: CT CHEST WITH CONTRAST TECHNIQUE: Multidetector CT imaging of the chest was performed during intravenous contrast administration. RADIATION DOSE REDUCTION: This exam was performed according to the departmental dose-optimization program which includes automated exposure control, adjustment of the mA and/or kV according to patient size and/or use of iterative reconstruction technique. CONTRAST:  15m OMNIPAQUE IOHEXOL 300 MG/ML  SOLN COMPARISON:  06/16/2021 FINDINGS: Cardiovascular: The heart size is normal. No substantial pericardial effusion. Coronary artery calcification is evident. Mild  atherosclerotic calcification is noted in the wall of the thoracic aorta. Left-sided Port-A-Cath tip is positioned in the mid SVC. Mediastinum/Nodes: No mediastinal lymphadenopathy. There is no hilar lymphadenopathy. The esophagus has normal imaging features. There is no axillary lymphadenopathy. Lungs/Pleura: Centrilobular emphsyema noted. Scarring in the medial right upper lung is stable. 9 mm paraspinal nodule measured previously is stable at 9 mm again today (image 46/4). Paraspinal ground-glass opacity measured in the right middle lobe previously at 2.6 x 2.3 cm is 2.5 x 2.2 cm today on image 93/4. Index left upper lobe ground-glass nodule is stable to minimally decreased in size, now measuring 9 x 5 mm compared to 10 x 6 mm previously. New small focus of tree-in-bud nodularity identified posterior left lower lobe (117/4) likely infectious/inflammatory and atypical infection would be a consideration. Upper Abdomen: Nodular liver contour is compatible with cirrhosis. Previously characterized biliary dilatation the left hepatic lobe is similar today. Low-density lesion seen previously in the pancreas  is not included in the field of view on today's study. Musculoskeletal: No worrisome lytic or sclerotic osseous abnormality. Stable L1 compression deformity. IMPRESSION: 1. New small focus of tree-in-bud nodularity posterior left lower lobe, likely infectious/inflammatory and atypical infection would be a consideration. 2. Otherwise stable exam. 3. Stable 9 mm paraspinal nodule in the right upper lung. 4. Stable 2.5 x 2.2 cm ground-glass opacity in the right middle lobe. As noted previously, this lesion has been progressive since 2019. 5. Nodular liver contour compatible with cirrhosis. 6. Low-density lesion seen previously in the pancreas is not included in the field of view on today's study. Continued attention on follow-up recommended. 7.  Aortic Atherosclerosis (ICD10-I70.0). Electronically Signed   By: Misty Stanley M.D.   On: 12/25/2021 17:40     ASSESSMENT:  1.  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 -Durvalumab consolidation from 09/19/2017 through 09/17/2018. -Denies any cough or hemoptysis. -I reviewed CT chest with contrast from 06/16/2019 which showed 9 mm nodule in the medial right upper lobe unchanged.  Adjacent radiation changes.  No evidence of progression or metastatic disease. -CT chest on 12/16/2019 showed stable radiation changes and small nodule in the medial right upper lobe with no signs of recurrence.  New bandlike area of septal thickening and micro nodularity in the right lateral and left anterior upper chest, most suggestive of sequela of infection/inflammation.  Signs of hepatic cirrhosis.   PLAN:  1.  Stage II (T1BN2) right upper lobe adenocarcinoma: - He is residing at [facility] assisted living facility after stroke. - CT chest with contrast (12/22/2021): New small focus of tree-in-bud nodularity most likely infectious/inflammatory.  Stable 9 mm paraspinal nodule in the right upper lung.  Stable 2.5 x 2.2 cm groundglass opacity in the right middle lobe.  Nodular liver contour with cirrhosis. - Reviewed labs which showed normal LFTs. - Recommend follow-up in 6 months with CT scan without contrast.   2.  Hypothyroidism: - Last TSH was 0.086 on 12/22/2021. - On 12/10/2021, his Synthroid was decreased from 125 mcg to 100 mcg.  3.  Normocytic anemia: - He has received Feraheme in the past.  Hemoglobin today is 11.7.  No indication for parenteral iron therapy. - We will check ferritin and iron panel at next visit.   Orders placed this encounter:  No orders of the defined types were placed in this encounter.    Derek Jack, MD [place] [phone]

## 2021-12-29 NOTE — Patient Instructions (Addendum)
[place] at [facility] Discharge Instructions   You were seen and examined today by Dr. Delton Coombes.  He reviewed the results of your CT scan which is stable. There are spots on your lung which we are watching.  Your lab work from last week is normal/stable.   We will see you back in 6 months. We will repeat labs and CT scan prior to this visit.    Thank you for choosing [place] at [facility] to provide your oncology and hematology care.  To afford each patient quality time with our provider, please arrive at least 15 minutes before your scheduled appointment time.   If you have a lab appointment with the [facility] please come in thru the Main Entrance and check in at the main information desk.  You need to re-schedule your appointment should you arrive 10 or more minutes late.  We strive to give you quality time with our providers, and arriving late affects you and other patients whose appointments are after yours.  Also, if you no show three or more times for appointments you may be dismissed from the clinic at the providers discretion.     Again, thank you for choosing [facility].  Our hope is that these requests will decrease the amount of time that you wait before being seen by our physicians.       _____________________________________________________________  Should you have questions after your visit to [facility], please contact our office at [phone] and follow the prompts.  Our office hours are 8:00 a.m. and 4:30 p.m. Monday - Friday.  Please note that voicemails left after 4:00 p.m. may not be returned until the following business day.  We are closed weekends and major holidays.  You do have access to a nurse 24-7, just call the main number to the clinic [phone] and do not press any options, hold on the line and a nurse will answer the phone.    For prescription refill requests, have your  pharmacy contact our office and allow 72 hours.    Due to Covid, you will need to wear a mask upon entering the hospital. If you do not have a mask, a mask will be given to you at the Main Entrance upon arrival. For doctor visits, patients may have 1 support person age 37 or older with them. For treatment visits, patients can not have anyone with them due to social distancing guidelines and our immunocompromised population.

## 2022-01-02 DIAGNOSIS — M6281 Muscle weakness (generalized): Secondary | ICD-10-CM | POA: Diagnosis not present

## 2022-01-02 DIAGNOSIS — R2689 Other abnormalities of gait and mobility: Secondary | ICD-10-CM | POA: Diagnosis not present

## 2022-01-03 DIAGNOSIS — R2689 Other abnormalities of gait and mobility: Secondary | ICD-10-CM | POA: Diagnosis not present

## 2022-01-03 DIAGNOSIS — M6281 Muscle weakness (generalized): Secondary | ICD-10-CM | POA: Diagnosis not present

## 2022-01-06 ENCOUNTER — Encounter (HOSPITAL_COMMUNITY): Payer: Self-pay | Admitting: Hematology

## 2022-01-09 DIAGNOSIS — R2689 Other abnormalities of gait and mobility: Secondary | ICD-10-CM | POA: Diagnosis not present

## 2022-01-09 DIAGNOSIS — M6281 Muscle weakness (generalized): Secondary | ICD-10-CM | POA: Diagnosis not present

## 2022-01-10 ENCOUNTER — Emergency Department (HOSPITAL_COMMUNITY)
Admission: EM | Admit: 2022-01-10 | Discharge: 2022-01-10 | Disposition: A | Discharge status: Home / Self Care | Payer: PPO | Attending: Emergency Medicine | Admitting: Emergency Medicine

## 2022-01-10 ENCOUNTER — Emergency Department (HOSPITAL_COMMUNITY): Payer: PPO

## 2022-01-10 ENCOUNTER — Ambulatory Visit: Payer: PPO | Admitting: Physician Assistant

## 2022-01-10 ENCOUNTER — Other Ambulatory Visit: Payer: Self-pay

## 2022-01-10 DIAGNOSIS — S80211A Abrasion, right knee, initial encounter: Secondary | ICD-10-CM | POA: Diagnosis not present

## 2022-01-10 DIAGNOSIS — S80811A Abrasion, right lower leg, initial encounter: Secondary | ICD-10-CM | POA: Insufficient documentation

## 2022-01-10 DIAGNOSIS — R0689 Other abnormalities of breathing: Secondary | ICD-10-CM | POA: Diagnosis not present

## 2022-01-10 DIAGNOSIS — Z7951 Long term (current) use of inhaled steroids: Secondary | ICD-10-CM | POA: Insufficient documentation

## 2022-01-10 DIAGNOSIS — W19XXXA Unspecified fall, initial encounter: Secondary | ICD-10-CM | POA: Diagnosis not present

## 2022-01-10 DIAGNOSIS — Z79899 Other long term (current) drug therapy: Secondary | ICD-10-CM | POA: Diagnosis not present

## 2022-01-10 DIAGNOSIS — M25461 Effusion, right knee: Secondary | ICD-10-CM | POA: Diagnosis not present

## 2022-01-10 DIAGNOSIS — M25551 Pain in right hip: Secondary | ICD-10-CM | POA: Diagnosis not present

## 2022-01-10 DIAGNOSIS — J449 Chronic obstructive pulmonary disease, unspecified: Secondary | ICD-10-CM | POA: Insufficient documentation

## 2022-01-10 DIAGNOSIS — Z043 Encounter for examination and observation following other accident: Secondary | ICD-10-CM | POA: Diagnosis not present

## 2022-01-10 DIAGNOSIS — S8991XA Unspecified injury of right lower leg, initial encounter: Secondary | ICD-10-CM | POA: Diagnosis present

## 2022-01-10 DIAGNOSIS — Y92129 Unspecified place in nursing home as the place of occurrence of the external cause: Secondary | ICD-10-CM | POA: Insufficient documentation

## 2022-01-10 DIAGNOSIS — R52 Pain, unspecified: Secondary | ICD-10-CM | POA: Diagnosis not present

## 2022-01-10 DIAGNOSIS — S0990XA Unspecified injury of head, initial encounter: Secondary | ICD-10-CM | POA: Insufficient documentation

## 2022-01-10 DIAGNOSIS — I10 Essential (primary) hypertension: Secondary | ICD-10-CM | POA: Diagnosis not present

## 2022-01-10 DIAGNOSIS — Z9889 Other specified postprocedural states: Secondary | ICD-10-CM | POA: Diagnosis not present

## 2022-01-10 DIAGNOSIS — W01198A Fall on same level from slipping, tripping and stumbling with subsequent striking against other object, initial encounter: Secondary | ICD-10-CM | POA: Diagnosis not present

## 2022-01-10 DIAGNOSIS — T68XXXA Hypothermia, initial encounter: Secondary | ICD-10-CM | POA: Diagnosis not present

## 2022-01-10 DIAGNOSIS — I959 Hypotension, unspecified: Secondary | ICD-10-CM | POA: Diagnosis not present

## 2022-01-10 DIAGNOSIS — T07XXXA Unspecified multiple injuries, initial encounter: Secondary | ICD-10-CM

## 2022-01-10 MED ORDER — TRIPLE ANTIBIOTIC 3.5-400-5000 EX OINT
TOPICAL_OINTMENT | Freq: Once | CUTANEOUS | Status: AC
Start: 2022-01-10 — End: 2022-01-10
  Filled 2022-01-10: qty 1

## 2022-01-10 NOTE — ED Notes (Signed)
Non stick telfa applied to right shin and wrapped with kurlex

## 2022-01-10 NOTE — Discharge Instructions (Signed)
Your xrays are negative for acute bony injury, no fractures.  You do have a small amount of swelling at your right knee cap which should resolve with time.  Ice packs applied to this site can be helpful.  Apply antibiotic ointment to your abrasions twice daily after a soap and water wash.

## 2022-01-10 NOTE — ED Notes (Signed)
Patient states he fell due to wet floor, denies hitting head or LOC. Pt with redness noted to right side of head.

## 2022-01-10 NOTE — Progress Notes (Incomplete)
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 Patient is on donepezil 10 mg daily.*** Personally reviewed CT of the head on 10/22/2019 showed mild to moderate chronic ischemic vessel disease.   Recommendations:   Follow up in   months. Continue donepezil 10 mg daily.  Side effects discussed. Continue to control mood as per PCP Continue to control cardiovascular risk factors.    Subjective:   This patient is accompanied in the office by his caretaker [name] who supplements the history. Previous records as well as any outside records available were reviewed prior to todays visit.  ***Last MMSE on 06/27/2021 was 27/30    Any changes in memory since last visit?  Memory is stable, he continues to enjoy doing crossword puzzles for about 2 hours a day.*** repeats oneself?  Denies Disoriented when walking into a room?  Patient denies   Leaving objects in unusual places?  Patient denies   Ambulates  with difficulty?  He uses a walker to ambulate, occasionally a wheelchair to carry oxygen if he goes somewhere.  He does physical therapy at the facility. Recent falls?  Patient denies   Any head injuries?  Patient denies   History of seizures?   Patient denies   Wandering behavior?  Patient denies   Patient drives?  *** Any mood changes since last visit?  Patient denies   Any worsening depression?:  Patient denies   Hallucinations?  Patient denies   Paranoia?  Patient denies   Patient reports that sleeps  well without vivid dreams, REM behavior or sleepwalking   History of sleep apnea?  Patient denies   Any hygiene concerns?  Patient denies   Independent of bathing and dressing?  He needs assistance due to gait instability. Does the patient needs help with medications?  Facility is in charge *** Who is in charge of the finances?  Son and daughter are in charge   *** Any changes in appetite?  Patient denies ***   Patient have trouble swallowing? Patient denies   Does the patient cook?  Patient denies   Any kitchen accidents such as leaving the stove on? Patient denies   Any headaches?  Patient denies   Double vision? Patient denies   Any focal numbness or tingling?  Patient denies   Chronic back pain Patient denies   Unilateral weakness?  Patient denies   Any tremors?  Patient denies   Any history of anosmia?  Patient denies   Any incontinence of urine?  Patient denies   Any bowel dysfunction?   Patient denies      Patient lives  ***in ALF, [place] with [place] able to participating in activities, well cared by his caretakers   Initial Visit 09/27/20 The patient is seen in neurologic consultation at the request of Redmond [employer], MD for the evaluation of memory.  The patient is accompanied by caregiver who supplements the history. He is a 81 y.o. year old RH  male who has had memory issues for about  several years, worse over the last 6 months, when he  lost his wife.  At the time, he went into a deep depression, as he was planning to move with her at [facility] in [place], and he was going to take his dog with him.  On his wife's death, he felt "lost, did not want to get out of the bed, did not want to interact with anyone, and he was feeling very irritable ".  Sertraline was tried, without any improvement.  It was during that time, that he was found to be severely hypothyroid with a TSH of 17, and was started on thyroid medication, which improved  significantly his mood, and irritability.  He reports his memory being "not good, especially with long-term memory ".  He states that at times he cannot remember the names of his grandmother, grandfather, or children. While before he was sleeping the whole day, now he sleeps well at night, without vivid dreams or sleepwalking, paranoia or hallucinations.  Occasionally he takes a nap. He needs assistance with bathing and dressing, as his gait is chronically unstable.  Medications are given by the facility staff, and the finances are managed by his son and daughter who is the power of attorney.  Denies leaving objects in unusual places.  Appetite is better, denies trouble swallowing.  He does not cook.  "My wife used to do all the cooking".  He ambulates with the walker very short distances, otherwise he uses a wheelchair to help move from one side to the other, as he is very weak, and has an oxygen tank with him.  He does participate in physical therapy at the facility.  He denies any recent falls.  He no longer drives because of "memory issues ".  His son "was afraid that I would get lost so he took the keys".  He denies any headaches, although he had an injury to the head in May 2022, with negative work-up at the ER.  He denies double vision, dizziness, focal numbness or tingling, unilateral weakness or tremors, urine incontinence or retention.  Denies constipation, diarrhea, or anosmia.  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  He quit heavy alcohol 10 years ago mostly beer and bourbon.  He quit 1 pack a day tobacco use for most of his life.  Family history remarkable for dementia in father.     Labs 07/19/20  CBC and CMP unremarkable UA neg  TSH 05/13/20 17.277 High, T4 1.28 elevated B12 04/22/20 645 nl    CT head wo contrast 07/19/20   No evidence of acute intracranial abnormality.Right anterior scalp hematoma.Stable mild-to-moderate cerebral atrophy and cerebral white matter chronic  small vessel ischemic disease. Known small chronic right cerebellar infarct.Mild paranasal sinus mucosal thickening at the imaged levels, most notably right maxillary. Right mastoid effusion.  Past Medical History:  Diagnosis Date   Anxiety    Arthritis    Cancer ([place])    Cirrhosis ([place])    confirmed by MRI on 07/04/11.     COPD (chronic obstructive pulmonary disease) (H[facility])    Depression with anxiety    ETOH abuse    Hyperlipidemia    Hypertension    diet control   Nodule of upper lobe of right lung    with mediastinal adenopathy   Wears dentures    Wears glasses      Past Surgical History:  Procedure Laterality Date   bilateral cataract surgery     [place]   broken arm  6 yrs ago  left otif of wrist-[place]   CLOSED REDUCTION WRIST FRACTURE Right 09/02/2015   Procedure: RIGHT WRIST REDUCTION;  Surgeon: Renette Butters, MD;  [facility];  Service: Orthopedics;  Laterality: Right;  with MAC   COLONOSCOPY  1996   [facility]: external hemorrhoids, no polyps   COLONOSCOPY  06/28/2011   Dr. Ala Bent diverticulosis,tubular adenoma, hyperplastic polyp   COLONOSCOPY WITH PROPOFOL N/A 04/26/2017   Dr. Gala Romney: perianal and digital rectal examinations normal, diffusely congested colonic mucosa but otherwise normal   ESOPHAGOGASTRODUODENOSCOPY  06/28/2011   Dr. Chelsea Aus erosive reflux esophagitis, hiatal hernia-gastritis   ESOPHAGOGASTRODUODENOSCOPY (EGD) WITH PROPOFOL N/A 04/26/2017   Dr. Gala Romney: normal esophagus, small hiatal hernia, GAVE, portal hypertensive gastropathy, normal duodenal bulb and second portion, no specimens collected. 2 years screening    ESOPHAGOGASTRODUODENOSCOPY (EGD) WITH PROPOFOL N/A 06/19/2019   normal esophagus, portal gastropathy, GAVE, normal duodenum. 2 years screening.    EXTERNAL EAR SURGERY     right ear-cleaned out ear and created new eardrum   INGUINAL HERNIA REPAIR  2-3 yrs ago   left-[facility]   INTRAMEDULLARY (IM) NAIL INTERTROCHANTERIC Right  09/02/2015   Procedure: RIGHT  INTERTROCHANTRIC HIP;  Surgeon: Renette Butters, MD;  Location: [facility];  Service: Orthopedics;  Laterality: Right;  With MAC   KNEE SURGERY     left-arthroscopy-[place]   PORTACATH PLACEMENT Left 06/08/2017   Procedure: INSERTION POWER PORT WITH  ATTACHED CATHETER LEFT SUBCLAVIAN;  Surgeon: Aviva Signs, MD;  Location: [facility];  Service: General;  Laterality: Left;   SKULL FRACTURE ELEVATION     fractured cheeck bone repaired   TOTAL HIP ARTHROPLASTY  12/18/2011   Procedure: TOTAL HIP ARTHROPLASTY;  Surgeon: Carole Civil, MD;  Location: [facility];  Service: Orthopedics;  Laterality: Left;   VIDEO BRONCHOSCOPY WITH ENDOBRONCHIAL ULTRASOUND N/A 05/04/2017   Procedure: VIDEO BRONCHOSCOPY WITH ENDOBRONCHIAL ULTRASOUND;  Surgeon: Melrose Nakayama, MD;  [facility];  Service: Thoracic;  Laterality: N/A;   WISDOM TOOTH EXTRACTION       PREVIOUS MEDICATIONS:   CURRENT MEDICATIONS:  Outpatient Encounter Medications as of 01/10/2022  Medication Sig   albuterol (VENTOLIN HFA) 108 (90 Base) MCG/ACT inhaler Inhale 90 mcg into the lungs 4 (four) times daily as needed.   cholecalciferol (VITAMIN D) 1000 units tablet Take 1,000 Units by mouth daily.   docusate sodium (COLACE) 100 MG capsule Take 100 mg by mouth 2 (two) times daily.   donepezil (ARICEPT) 10 MG tablet TAKE 1 TABLET BY MOUTH AT BEDTIME.   doxycycline (VIBRAMYCIN) 100 MG capsule Take 1 capsule (100 mg total) by mouth 2 (two) times daily.   feeding supplement (ENSURE ENLIVE / ENSURE PLUS) LIQD Take 237 mLs by mouth 2 (two) times daily between meals.   gabapentin (NEURONTIN) 100 MG capsule Take 1 capsule (100 mg total) by mouth 3 (three) times daily for 14 days. (Patient not taking: Reported on 06/27/2021)   GOODSENSE ARTIFICIAL TEARS 0.5-0.6 % SOLN Apply to eye.   guaiFENesin (MUCUS RELIEF ADULT PO) Take by mouth.   Lactobacillus-Inulin (CULTURELLE DIGESTIVE DAILY PO) Take by mouth daily.   levothyroxine  (SYNTHROID) 125 MCG tablet TAKE (1) TABLET BY MOUTH ONCE DAILY ON AN EMPTY STOMACH.   lidocaine-prilocaine (EMLA) cream Apply 1 application topically as needed.   loperamide (IMODIUM) 2 MG capsule Take by mouth as needed for diarrhea or loose stools.   methylPREDNISolone (MEDROL DOSEPAK) 4 MG TBPK tablet Take as directed   NON FORMULARY Diet NAS   OXYGEN Inhale 2  L into the lungs continuous.   Psyllium (METAMUCIL PO) Take by mouth. 1 capsule twice daily   rosuvastatin (CRESTOR) 10 MG tablet Take 1 tablet (10 mg total) by mouth daily.   SPIRIVA RESPIMAT 2.5 MCG/ACT AERS Inhale 2 puffs into the lungs daily.   triamcinolone (KENALOG) 0.025 % ointment Apply 1 application topically 2 (two) times daily. Apply to face and arms sparingly for dryness   vitamin B-12 (CYANOCOBALAMIN) 1000 MCG tablet Take 1 tablet (1,000 mcg total) by mouth daily.   No facility-administered encounter medications on file as of 01/10/2022.     Objective:     PHYSICAL EXAMINATION:    VITALS:  There were no vitals filed for this visit.  GEN:  The patient appears stated age and is in NAD. HEENT:  Normocephalic, atraumatic.   Neurological examination:  General: NAD, well-groomed, appears stated age. Orientation: The patient is alert. Oriented to person, place and date Cranial nerves: There is good facial symmetry.The speech is fluent and clear. No aphasia or dysarthria. Fund of knowledge is appropriate. Recent memory impaired and remote memory is normal.  Attention and concentration are normal.  Able to name objects and repeat phrases.  Hearing is intact to conversational tone.    Sensation: Sensation is intact to light touch throughout Motor: Strength is at least antigravity x4. Tremors: none  DTR's 2/4 in UE/LE      09/27/2020   12:00 PM  Montreal Cognitive Assessment   Visuospatial/ Executive (0/5) 0  Naming (0/3) 2  Attention: Read list of digits (0/2) 2  Attention: Read list of letters (0/1) 1   Attention: Serial 7 subtraction starting at 100 (0/3) 1  Language: Repeat phrase (0/2) 1  Language : Fluency (0/1) 0  Abstraction (0/2) 0  Delayed Recall (0/5) 0  Orientation (0/6) 4  Total 11  Adjusted Score (based on education) 11       06/27/2021    3:00 PM  MMSE - Mini Mental State Exam  Orientation to time 5  Orientation to Place 5  Registration 3  Attention/ Calculation 5  Recall 1  Language- name 2 objects 2  Language- repeat 1  Language- follow 3 step command 3  Language- read & follow direction 1  Write a sentence 1  Copy design 0  Total score 27       Movement examination: Tone: There is normal tone in the UE/LE Abnormal movements:  no tremor.  No myoclonus.  No asterixis.   Coordination:  There is no decremation with RAM's. Normal finger to nose  Gait and Station: The patient has no difficulty arising out of a deep-seated chair without the use of the hands. The patient's stride length is good.  Gait is cautious and narrow.   Thank you for allowing [place] the opportunity to participate in the care of this nice patient. Please do not hesitate to contact us for any questions or concerns.   Total time spent on today's visit was *** minutes dedicated to this patient today, preparing to see patient, examining the patient, ordering tests and/or medications and counseling the patient, documenting clinical information in the EHR or other health record, independently interpreting results and communicating results to the patient/family, discussing treatment and goals, answering patient's questions and coordinating care.  Cc:  Sharilyn Sites, MD  Sharene Butters 01/10/2022 7:07 AM

## 2022-01-10 NOTE — ED Triage Notes (Signed)
[facility] via [facility], staff stated pt was stuck with R knee on side of toilet. Pain to R leg, hit head, denies LOC, Per EMS, pt is not on blood thinners

## 2022-01-11 DIAGNOSIS — R2689 Other abnormalities of gait and mobility: Secondary | ICD-10-CM | POA: Diagnosis not present

## 2022-01-11 DIAGNOSIS — M6281 Muscle weakness (generalized): Secondary | ICD-10-CM | POA: Diagnosis not present

## 2022-01-12 NOTE — ED Provider Notes (Signed)
[facility] EMERGENCY DEPARTMENT Provider Note   CSN: [id number] Arrival date & time: 01/10/22  1540     History  Chief Complaint  Patient presents with   Ian Aguilar is a 81 y.o. male with a history of COPD, hypertension and alcohol induced cirrhosis presenting from his local nursing facility for evaluation of a fall.  He fell just prior to arrival, stating he had just use the restroom and was attempting to zip his pants when his feet slipped and he fell wedged between the wall and the toilet.  He was unable to get himself out and required assistance of the nursing staff to help him up.  He does endorse hitting his head against the wall but denies LOC, dizziness or headache at this time also no nausea or vomiting.  He landed directly on his right knee and has pain and swelling here also describes pain in his right hip.  He has had no treatment prior to arrival.  He also sustained abrasions on his right lower leg.  The history is provided by the patient.       Home Medications Prior to Admission medications   Medication Sig Start Date End Date Taking? Authorizing Provider  albuterol (VENTOLIN HFA) 108 (90 Base) MCG/ACT inhaler Inhale 90 mcg into the lungs 4 (four) times daily as needed. 06/16/21   [provider]  cholecalciferol (VITAMIN D) 1000 units tablet Take 1,000 Units by mouth daily.    [provider]  docusate sodium (COLACE) 100 MG capsule Take 100 mg by mouth 2 (two) times daily.    [provider]  donepezil (ARICEPT) 10 MG tablet TAKE 1 TABLET BY MOUTH AT BEDTIME. 09/01/21   Shawn Route, Coralee Pesa, PA-C  doxycycline (VIBRAMYCIN) 100 MG capsule Take 1 capsule (100 mg total) by mouth 2 (two) times daily. 07/11/21   Horton, Alvin Critchley, DO  feeding supplement (ENSURE ENLIVE / ENSURE PLUS) LIQD Take 237 mLs by mouth 2 (two) times daily between meals. 04/27/20   [provider]  gabapentin (NEURONTIN) 100 MG capsule Take 1 capsule (100 mg  total) by mouth 3 (three) times daily for 14 days. Patient not taking: Reported on 06/27/2021 10/06/20 10/20/20  Carole Civil, MD  GOODSENSE ARTIFICIAL TEARS 0.5-0.6 % SOLN Apply to eye. 05/24/20   [provider]  guaiFENesin (MUCUS RELIEF ADULT PO) Take by mouth.    [provider]  Lactobacillus-Inulin (CULTURELLE DIGESTIVE DAILY PO) Take by mouth daily.    [provider]  levothyroxine (SYNTHROID) 125 MCG tablet TAKE (1) TABLET BY MOUTH ONCE DAILY ON AN EMPTY STOMACH. 05/09/21   Derek Jack, MD  lidocaine-prilocaine (EMLA) cream Apply 1 application topically as needed. 04/20/21   Derek Jack, MD  loperamide (IMODIUM) 2 MG capsule Take by mouth as needed for diarrhea or loose stools.    [provider]  methylPREDNISolone (MEDROL DOSEPAK) 4 MG TBPK tablet Take as directed 07/11/21   Horton, Alvin Critchley, DO  NON FORMULARY Diet NAS 04/23/20   [provider]  OXYGEN Inhale 2 L into the lungs continuous. 04/24/20   [provider]  Psyllium (METAMUCIL PO) Take by mouth. 1 capsule twice daily    [provider]  rosuvastatin (CRESTOR) 10 MG tablet Take 1 tablet (10 mg total) by mouth daily. 05/21/20   Gerlene Fee, NP  SPIRIVA RESPIMAT 2.5 MCG/ACT AERS Inhale 2 puffs into the lungs daily. 05/31/20   [provider]  triamcinolone (KENALOG)  0.025 % ointment Apply 1 application topically 2 (two) times daily. Apply to face and arms sparingly for dryness 05/24/20   [provider]  vitamin B-12 (CYANOCOBALAMIN) 1000 MCG tablet Take 1 tablet (1,000 mcg total) by mouth daily. 02/10/21   Derek Jack, MD      Allergies    Patient has no known allergies.    Review of Systems   Review of Systems  Constitutional:  Negative for fever.  Eyes:  Negative for visual disturbance.  Gastrointestinal:  Negative for nausea and vomiting.  Musculoskeletal:  Positive for arthralgias and joint swelling. Negative  for myalgias.  Skin:  Positive for wound.  Neurological:  Negative for weakness, numbness and headaches.    Physical Exam Updated Vital Signs BP (!) 160/58   Pulse 63   Temp 98.4 F (36.9 C)   Resp 16   Ht 5\' 11"  (1.803 m)   Wt 69.8 kg   SpO2 99%   BMI 21.45 kg/m  Physical Exam Vitals and nursing note reviewed.  Constitutional:      Appearance: He is well-developed.  HENT:     Head: Normocephalic and atraumatic.  Eyes:     Conjunctiva/sclera: Conjunctivae normal.     Pupils: Pupils are equal, round, and reactive to light.  Cardiovascular:     Rate and Rhythm: Normal rate and regular rhythm.     Heart sounds: Normal heart sounds.  Pulmonary:     Effort: Pulmonary effort is normal.     Breath sounds: Normal breath sounds. No wheezing.  Abdominal:     General: Bowel sounds are normal.     Palpations: Abdomen is soft.     Tenderness: There is no abdominal tenderness.  Musculoskeletal:        General: Normal range of motion.     Cervical back: Normal range of motion. No bony tenderness. No pain with movement.     Right hip: Bony tenderness present. Normal range of motion.     Right knee: Swelling present.     Comments: Mild edema noted at his right patella.  No palpable or visible deformity.  He is able to display moderate range of motion of the right knee without significant discomfort.  He has superficial abrasions on his right tibia, hemostatic.  Tenderness to palpation at his right greater trochanter of his hip, there is no external rotation or leg shortening.  He can flex and extend his hip without significant discomfort.  Skin:    General: Skin is warm and dry.  Neurological:     Mental Status: He is alert.     ED Results / Procedures / Treatments   Labs (all labs ordered are listed, but only abnormal results are displayed) Labs Reviewed - No data to display  EKG None  Radiology DG Knee Complete 4 Views Right  Result Date: 01/10/2022 CLINICAL DATA:  Fall  EXAM: RIGHT KNEE - COMPLETE 4+ VIEW COMPARISON:  Femur radiographs 03/14/2016 FINDINGS: Postsurgical changes reflecting intramedullary rod fixation of the femur are partially imaged. There is no evidence of perihardware fracture. There is no other evidence of acute fracture or dislocation. Knee alignment is maintained. There is mild lateral and moderate medial compartment joint space narrowing with associated osteophytosis. There is a small suprapatellar effusion. IMPRESSION: 1. No acute fracture or dislocation. 2. Small suprapatellar effusion. 3. Lateral worse than medial compartment joint space narrowing with associated osteophytosis. Electronically Signed   By: Valetta Mole M.D.   On: 01/10/2022 18:09   DG  HIPS BILAT WITH PELVIS MIN 5 VIEWS  Result Date: 01/10/2022 CLINICAL DATA:  Fall EXAM: DG HIP (WITH OR WITHOUT PELVIS) 5+V BILAT COMPARISON:  09/22/2020 FINDINGS: Extensive vascular calcifications. SI joints are non widened. Pubic symphysis and rami appear intact. Status post left hip replacement with intact hardware and normal alignment. No fracture. Partially visualized intramedullary rod within the right femur. No fracture or malalignment IMPRESSION: Status post left hip replacement and right femoral intramedullary rodding. No acute osseous abnormality. Electronically Signed   By: Donavan Foil M.D.   On: 01/10/2022 18:09   CT Head Wo Contrast  Result Date: 01/10/2022 CLINICAL DATA:  Head injury after fall. EXAM: CT HEAD WITHOUT CONTRAST TECHNIQUE: Contiguous axial images were obtained from the base of the skull through the vertex without intravenous contrast. RADIATION DOSE REDUCTION: This exam was performed according to the departmental dose-optimization program which includes automated exposure control, adjustment of the mA and/or kV according to patient size and/or use of iterative reconstruction technique. COMPARISON:  Jul 19, 2020. FINDINGS: Brain: No evidence of acute infarction, hemorrhage,  hydrocephalus, extra-axial collection or mass lesion/mass effect. Vascular: No hyperdense vessel or unexpected calcification. Skull: Normal. Negative for fracture or focal lesion. Sinuses/Orbits: No acute finding. Other: None. IMPRESSION: No acute intracranial abnormality seen. Electronically Signed   By: Marijo Conception M.D.   On: 01/10/2022 17:41    Procedures Procedures    Medications Ordered in ED Medications  neomycin-bacitracin-polymyxin [phone] OINT ( Apply externally Given 01/10/22 1743)    ED Course/ Medical Decision Making/ A&P                           Medical Decision Making Patient who is sustained a fall prior to arrival, complains of right knee pain right hip pain.  He hit his head but has no complaints of headache or neck pain.  Imaging negative for acute injury except for mild prepatellar right effusion.  No joint effusion is present clinically or per imaging.  He was provided an Ace wrap for the knee, abrasions were cleaned, antibiotic and dressings applied.  Parent follow-up anticipated.  Amount and/or Complexity of Data Reviewed Radiology: ordered and independent interpretation performed.    Details: Imaging reviewed, negative for acute findings except for a mild prepatellar effusion right knee.  No bony deformity.  Bilateral hip hardware intact.  CT head negative for acute findings.  Reviewed and agree with interpretation.  Risk OTC drugs.           Final Clinical Impression(s) / ED Diagnoses Final diagnoses:  Fall, initial encounter  Minor head injury, initial encounter  Abrasions of multiple sites    Rx / DC Orders ED Discharge Orders     None         Landis Martins 01/12/22 [place], [place], DO 01/12/22 1417

## 2022-01-16 DIAGNOSIS — R2689 Other abnormalities of gait and mobility: Secondary | ICD-10-CM | POA: Diagnosis not present

## 2022-01-16 DIAGNOSIS — R296 Repeated falls: Secondary | ICD-10-CM | POA: Diagnosis not present

## 2022-01-16 DIAGNOSIS — M6281 Muscle weakness (generalized): Secondary | ICD-10-CM | POA: Diagnosis not present

## 2022-01-16 DIAGNOSIS — Z6822 Body mass index (BMI) 22.0-22.9, adult: Secondary | ICD-10-CM | POA: Diagnosis not present

## 2022-01-16 DIAGNOSIS — S81811A Laceration without foreign body, right lower leg, initial encounter: Secondary | ICD-10-CM | POA: Diagnosis not present

## 2022-01-17 DIAGNOSIS — M6281 Muscle weakness (generalized): Secondary | ICD-10-CM | POA: Diagnosis not present

## 2022-01-17 DIAGNOSIS — R2689 Other abnormalities of gait and mobility: Secondary | ICD-10-CM | POA: Diagnosis not present

## 2022-01-23 DIAGNOSIS — M6281 Muscle weakness (generalized): Secondary | ICD-10-CM | POA: Diagnosis not present

## 2022-01-23 DIAGNOSIS — R2689 Other abnormalities of gait and mobility: Secondary | ICD-10-CM | POA: Diagnosis not present

## 2022-01-25 DIAGNOSIS — R2689 Other abnormalities of gait and mobility: Secondary | ICD-10-CM | POA: Diagnosis not present

## 2022-01-25 DIAGNOSIS — M6281 Muscle weakness (generalized): Secondary | ICD-10-CM | POA: Diagnosis not present

## 2022-01-30 DIAGNOSIS — M6281 Muscle weakness (generalized): Secondary | ICD-10-CM | POA: Diagnosis not present

## 2022-01-30 DIAGNOSIS — R2689 Other abnormalities of gait and mobility: Secondary | ICD-10-CM | POA: Diagnosis not present

## 2022-02-01 DIAGNOSIS — M6281 Muscle weakness (generalized): Secondary | ICD-10-CM | POA: Diagnosis not present

## 2022-02-01 DIAGNOSIS — R2689 Other abnormalities of gait and mobility: Secondary | ICD-10-CM | POA: Diagnosis not present

## 2022-02-06 DIAGNOSIS — R2689 Other abnormalities of gait and mobility: Secondary | ICD-10-CM | POA: Diagnosis not present

## 2022-02-06 DIAGNOSIS — M6281 Muscle weakness (generalized): Secondary | ICD-10-CM | POA: Diagnosis not present

## 2022-02-08 DIAGNOSIS — M6281 Muscle weakness (generalized): Secondary | ICD-10-CM | POA: Diagnosis not present

## 2022-02-08 DIAGNOSIS — R2689 Other abnormalities of gait and mobility: Secondary | ICD-10-CM | POA: Diagnosis not present

## 2022-02-13 ENCOUNTER — Ambulatory Visit (INDEPENDENT_AMBULATORY_CARE_PROVIDER_SITE_OTHER): Payer: PPO | Admitting: Physician Assistant

## 2022-02-13 ENCOUNTER — Encounter: Payer: Self-pay | Admitting: Physician Assistant

## 2022-02-13 VITALS — BP 137/67 | HR 61 | Ht 71.0 in

## 2022-02-13 DIAGNOSIS — G3184 Mild cognitive impairment, so stated: Secondary | ICD-10-CM | POA: Diagnosis not present

## 2022-02-13 DIAGNOSIS — R2689 Other abnormalities of gait and mobility: Secondary | ICD-10-CM | POA: Diagnosis not present

## 2022-02-13 DIAGNOSIS — M6281 Muscle weakness (generalized): Secondary | ICD-10-CM | POA: Diagnosis not present

## 2022-02-13 NOTE — Patient Instructions (Signed)
It was a pleasure to see you today at our office.   Recommendations:  Follow up in  6 months Continue donepezil 10 mg daily. Side effects were discussed    RECOMMENDATIONS FOR ALL PATIENTS WITH MEMORY PROBLEMS: 1. Continue to exercise (Recommend 30 minutes of walking everyday, or 3 hours every week) 2. Increase social interactions - continue going to [place] and enjoy social gatherings with friends and family 3. Eat healthy, avoid fried foods and eat more fruits and vegetables 4. Maintain adequate blood pressure, blood sugar, and blood cholesterol level. Reducing the risk of stroke and cardiovascular disease also helps promoting better memory. 5. Avoid stressful situations. Live a simple life and avoid aggravations. Organize your time and prepare for the next day in anticipation. 6. Sleep well, avoid any interruptions of sleep and avoid any distractions in the bedroom that may interfere with adequate sleep quality 7. Avoid sugar, avoid sweets as there is a strong link between excessive sugar intake, diabetes, and cognitive impairment We discussed the Mediterranean diet, which has been shown to help patients reduce the risk of progressive memory disorders and reduces cardiovascular risk. This includes eating fish, eat fruits and green leafy vegetables, nuts like almonds and hazelnuts, walnuts, and also use olive oil. Avoid fast foods and fried foods as much as possible. Avoid sweets and sugar as sugar use has been linked to worsening of memory function.  There is always a concern of gradual progression of memory problems. If this is the case, then we may need to adjust level of care according to patient needs. Support, both to the patient and caregiver, should then be put into place.    FALL PRECAUTIONS: Be cautious when walking. Scan the area for obstacles that may increase the risk of trips and falls. When getting up in the mornings, sit up at the edge of the bed for a few minutes before getting  out of bed. Consider elevating the bed at the head end to avoid drop of blood pressure when getting up. Walk always in a well-lit room (use night lights in the walls). Avoid area rugs or power cords from appliances in the middle of the walkways. Use a walker or a cane if necessary and consider physical therapy for balance exercise. Get your eyesight checked regularly.  FINANCIAL OVERSIGHT: Supervision, especially oversight when making financial decisions or transactions is also recommended.  HOME SAFETY: Consider the safety of the kitchen when operating appliances like stoves, microwave oven, and blender. Consider having supervision and share cooking responsibilities until no longer able to participate in those. Accidents with firearms and other hazards in the house should be identified and addressed as well.   ABILITY TO BE LEFT ALONE: If patient is unable to contact 911 operator, consider using LifeLine, or when the need is there, arrange for someone to stay with patients. Smoking is a fire hazard, consider supervision or cessation. Risk of wandering should be assessed by caregiver and if detected at any point, supervision and safe proof recommendations should be instituted.  MEDICATION SUPERVISION: Inability to self-administer medication needs to be constantly addressed. Implement a mechanism to ensure safe administration of the medications.

## 2022-02-13 NOTE — Progress Notes (Signed)
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 RH male  with a history of hypertension, hyperlipidemia, iron deficiency anemia, hypothyroidism (with very low TSH last 09/2020, followed by PCP closely), ASCAD, COPD on 2 L O2, Lung Ca, anxiety, depression and  remote history of alcohol abuse seen today in follow up for memory loss. he is currently on presenting today in follow-up for evaluation of memory loss. Patient is on  donepezil 10 mg daily, tolerating well. His MMSE today is 26/30, stable from prior.     Recommendations:   Follow up in  6 months. Continue donepezil 10 mg daily. Side effects were discussed  Continue PT for strength and balance Recommend good control of cardiovascular risk factors.   Continue to control mood as per PCP    Subjective:   This patient is accompanied in the office by his caregiver  who supplements the history. Previous records as well as any outside records available were reviewed prior to todays visit. Last seen on 06/27/21 at which time his MMSE was 27/30      Any changes in memory since last visit?  "About the same ".  He continues to do crossword puzzles for about 2 hours a day and Bingo 2 times a weeks.  repeats oneself?  Denies Disoriented when walking into a room?  Patient denies except occasionally not remembering what patient came to the room for   Leaving objects in unusual places?  Patient denies   Wandering behavior?  Patient denies   Any personality changes since last visit?  Patient denies   Any worsening depression?:  Patient denies. He enjoys interaction with people at the facility. Hallucinations or paranoia?  Patient denies   Seizures?   Patient denies    Any sleep changes?  Denies  vivid dreams, REM behavior or sleepwalking   Sleep apnea?  Patient denies   Any hygiene concerns?  Patient denies   Independent of bathing and dressing?  Needs assistance  due to gait instability Does the patient needs help with medications?  Facility is in charge  Who is in charge of the finances?  Son and daughter are in charge of the finances    Any changes in appetite?  Patient denies    Patient have trouble swallowing? Patient denies   Does the patient cook?  Any kitchen accidents such as leaving the stove on? Patient denies   Any headaches?  Patient denies   Chronic back pain Patient denies   Ambulates with difficulty?   He uses a walker, occasionally a wheelchair to carry oxygen if he goes somewhere.  He does PT at the facility. Recent falls or head injuries? Endorsed, on 01/10/22 at his nursing home as he was attempting to zip his pants at the bathroom, slipping between the wall and the toilet, landing on his R knee. He had hit his head but no LOC, dizziness or HA, cleared at the ED. Unilateral weakness, numbness or tingling?  Patient denies   Any tremors?  Patient denies   Any anosmia?  Patient denies   Any incontinence of urine?  Patient denies  Any bowel dysfunction?   Patient denies      Patient lives  [facility] in [place]     Initial Visit 09/27/20 The patient is seen in neurologic consultation at the request of Redmond School, MD for the evaluation of memory.  The patient  is accompanied by caregiver who supplements the history. He is a 81 y.o. year old RH  male who has had memory issues for about  several years, worse over the last 6 months, when he lost his wife.  At the time, he went into a deep depression, as he was planning to move with her at [facility] in [place], and he was going to take his dog with him.  On his wife's death, he felt "lost, did not want to get out of the bed, did not want to interact with anyone, and he was feeling very irritable ".  Sertraline was tried, without any improvement.  It was during that time, that he was found to be severely hypothyroid with a TSH of  17, and was started on thyroid medication, which improved significantly his mood, and irritability.  He reports his memory being "not good, especially with long-term memory ".  He states that at times he cannot remember the names of his grandmother, grandfather, or children. While before he was sleeping the whole day, now he sleeps well at night, without vivid dreams or sleepwalking, paranoia or hallucinations.  Occasionally he takes a nap.He needs assistance with bathing and dressing, as his gait is chronically unstable.  Medications are given by the facility staff, and the finances are managed by his son and daughter who is the power of attorney.  Denies leaving objects in unusual places.  Appetite is better, denies trouble swallowing.  He does not cook.  "My wife used to do all the cooking".  He ambulates with the walker very short distances, otherwise he uses a wheelchair to help move from one side to the other, as he is very weak, and has an oxygen tank with him.  He does participate in physical therapy at the facility.  He denies any recent falls.  He no longer drives because of "memory issues ".  His son "was afraid that I would get lost so he took the keys".  He denies any headaches, although he had an injury to the head in May 2022, with negative work-up at the ER.  He denies double vision, dizziness, focal numbness or tingling, unilateral weakness or tremors, urine incontinence or retention.  Denies constipation, diarrhea, or anosmia.  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  He quit heavy alcohol 10 years ago mostly beer and bourbon.  He quit 1 pack a day tobacco use for most of his life.  Family history remarkable for dementia in father.     Labs 07/19/20  CBC and CMP unremarkable UA neg  TSH 05/13/20 17.277 High, T4 1.28 elevated B12 04/22/20 645 nl    CT head wo contrast 07/19/20 No evidence of acute intracranial abnormality.Right anterior scalp hematoma.Stable mild-to-moderate  cerebral atrophy and cerebral white matter chronic small vessel ischemic disease. Known small chronic right cerebellar infarct.Mild paranasal sinus mucosal thickening at the imaged levels, most notably right maxillary. Right mastoid effusion.  Past Medical History:  Diagnosis Date   Anxiety    Arthritis    Cancer ([facility])    Cirrhosis ([facility])    confirmed by MRI on 07/04/11.     COPD (chronic obstructive pulmonary disease) ([facility])    Depression with anxiety    ETOH abuse    Hyperlipidemia    Hypertension    diet control   Nodule of upper lobe of right lung    with mediastinal adenopathy   Wears dentures    Wears  glasses      Past Surgical History:  Procedure Laterality Date   bilateral cataract surgery     [place]   broken arm  6 yrs ago   left otif of wrist-[facility]   CLOSED REDUCTION WRIST FRACTURE Right 09/02/2015   Procedure: RIGHT WRIST REDUCTION;  Surgeon: Renette Butters, MD;  Location: [facility];  Service: Orthopedics;  Laterality: Right;  with MAC   COLONOSCOPY  1996   [facility]: external hemorrhoids, no polyps   COLONOSCOPY  06/28/2011   Dr. Ala Bent diverticulosis,tubular adenoma, hyperplastic polyp   COLONOSCOPY WITH PROPOFOL N/A 04/26/2017   Dr. Gala Romney: perianal and digital rectal examinations normal, diffusely congested colonic mucosa but otherwise normal   ESOPHAGOGASTRODUODENOSCOPY  06/28/2011   Dr. Chelsea Aus erosive reflux esophagitis, hiatal hernia-gastritis   ESOPHAGOGASTRODUODENOSCOPY (EGD) WITH PROPOFOL N/A 04/26/2017   Dr. Gala Romney: normal esophagus, small hiatal hernia, GAVE, portal hypertensive gastropathy, normal duodenal bulb and second portion, no specimens collected. 2 years screening    ESOPHAGOGASTRODUODENOSCOPY (EGD) WITH PROPOFOL N/A 06/19/2019   normal esophagus, portal gastropathy, GAVE, normal duodenum. 2 years screening.    EXTERNAL EAR SURGERY     right ear-cleaned out ear and created new eardrum   INGUINAL HERNIA REPAIR  2-3 yrs ago   left-[facility]_    INTRAMEDULLARY (IM) NAIL INTERTROCHANTERIC Right 09/02/2015   Procedure: RIGHT  INTERTROCHANTRIC HIP;  Surgeon: Renette Butters, MD;  Location: [facility];  Service: Orthopedics;  Laterality: Right;  With MAC   KNEE SURGERY     left-arthroscopy-[facility]   PORTACATH PLACEMENT Left 06/08/2017   Procedure: INSERTION POWER PORT WITH  ATTACHED CATHETER LEFT SUBCLAVIAN;  Surgeon: Aviva Signs, MD;  Location: [facility];  Service: General;  Laterality: Left;   SKULL FRACTURE ELEVATION     fractured cheeck bone repaired   TOTAL HIP ARTHROPLASTY  12/18/2011   Procedure: TOTAL HIP ARTHROPLASTY;  Surgeon: Carole Civil, MD;  Location: [facility];  Service: Orthopedics;  Laterality: Left;   VIDEO BRONCHOSCOPY WITH ENDOBRONCHIAL ULTRASOUND N/A 05/04/2017   Procedure: VIDEO BRONCHOSCOPY WITH ENDOBRONCHIAL ULTRASOUND;  Surgeon: Melrose Nakayama, MD;  Location[facility];  Service: Thoracic;  Laterality: N/A;   WISDOM TOOTH EXTRACTION       PREVIOUS MEDICATIONS:   CURRENT MEDICATIONS:  Outpatient Encounter Medications as of 02/13/2022  Medication Sig   albuterol (VENTOLIN HFA) 108 (90 Base) MCG/ACT inhaler Inhale 90 mcg into the lungs 4 (four) times daily as needed.   cholecalciferol (VITAMIN D) 1000 units tablet Take 1,000 Units by mouth daily.   docusate sodium (COLACE) 100 MG capsule Take 100 mg by mouth 2 (two) times daily.   donepezil (ARICEPT) 10 MG tablet TAKE 1 TABLET BY MOUTH AT BEDTIME.   doxycycline (VIBRAMYCIN) 100 MG capsule Take 1 capsule (100 mg total) by mouth 2 (two) times daily.   feeding supplement (ENSURE ENLIVE / ENSURE PLUS) LIQD Take 237 mLs by mouth 2 (two) times daily between meals.   GOODSENSE ARTIFICIAL TEARS 0.5-0.6 % SOLN Apply to eye.   guaiFENesin (MUCUS RELIEF ADULT PO) Take by mouth.   Lactobacillus-Inulin (CULTURELLE DIGESTIVE DAILY PO) Take by mouth daily.   levothyroxine (SYNTHROID) 125 MCG tablet TAKE (1) TABLET BY MOUTH ONCE DAILY ON AN EMPTY STOMACH.    lidocaine-prilocaine (EMLA) cream Apply 1 application topically as needed.   loperamide (IMODIUM) 2 MG capsule Take by mouth as needed for diarrhea or loose stools.   methylPREDNISolone (MEDROL DOSEPAK) 4 MG TBPK tablet Take as directed   NON FORMULARY Diet NAS  OXYGEN Inhale 2 L into the lungs continuous.   Psyllium (METAMUCIL PO) Take by mouth. 1 capsule twice daily   rosuvastatin (CRESTOR) 10 MG tablet Take 1 tablet (10 mg total) by mouth daily.   SPIRIVA RESPIMAT 2.5 MCG/ACT AERS Inhale 2 puffs into the lungs daily.   triamcinolone (KENALOG) 0.025 % ointment Apply 1 application topically 2 (two) times daily. Apply to face and arms sparingly for dryness   vitamin B-12 (CYANOCOBALAMIN) 1000 MCG tablet Take 1 tablet (1,000 mcg total) by mouth daily.   gabapentin (NEURONTIN) 100 MG capsule Take 1 capsule (100 mg total) by mouth 3 (three) times daily for 14 days. (Patient not taking: Reported on 06/27/2021)   No facility-administered encounter medications on file as of 02/13/2022.     Objective:     PHYSICAL EXAMINATION:    VITALS:   Vitals:   02/13/22 0923  BP: 137/67  Pulse: 61  SpO2: 95%  Height: _0  (1.803 m)    GEN:  The patient appears stated age and is in NAD. HEENT:  Normocephalic, atraumatic.   Neurological examination:  General: NAD, well-groomed, appears stated age. Orientation: The patient is alert. Oriented to person, place and date Cranial nerves: There is good facial symmetry.The speech is fluent and clear. No aphasia or dysarthria. Fund of knowledge is appropriate. Recent memory impaired and remote memory is normal.  Attention and concentration are normal.  Able to name objects and repeat phrases.  Hearing is intact to conversational tone.    Sensation: Sensation is intact to light touch throughout Motor: Strength is at least antigravity x4. Tremors: none  DTR's 1/4 in UE/LE      09/27/2020   12:00 PM  Montreal Cognitive Assessment   Visuospatial/  Executive (0/5) 0  Naming (0/3) 2  Attention: Read list of digits (0/2) 2  Attention: Read list of letters (0/1) 1  Attention: Serial 7 subtraction starting at 100 (0/3) 1  Language: Repeat phrase (0/2) 1  Language : Fluency (0/1) 0  Abstraction (0/2) 0  Delayed Recall (0/5) 0  Orientation (0/6) 4  Total 11  Adjusted Score (based on education) 11       02/13/2022    9:00 AM 06/27/2021    3:00 PM  MMSE - Mini Mental State Exam  Orientation to time 5 5  Orientation to Place 5 5  Registration 3 3  Attention/ Calculation 5 5  Recall 1 1  Language- name 2 objects 2 2  Language- repeat 1 1  Language- follow 3 step command 3 3  Language- read & follow direction 1 1  Write a sentence 0 1  Copy design 0 0  Total score 26 27       Movement examination: Tone: There is normal tone in the UE/LE Abnormal movements:  no tremor.  No myoclonus.  No asterixis.   Coordination:  There is no decremation with RAM's. Normal finger to nose  Gait and Station: The patient has difficulty arising out of a deep-seated chair without the use of the hands. The patient's stride length is short.  Gait is cautious, mild ataxia noted,narrow  Thank you for allowing [place] the opportunity to participate in the care of this nice patient. Please do not hesitate to contact us for any questions or concerns.   Total time spent on today's visit was 30 minutes dedicated to this patient today, preparing to see patient, examining the patient, ordering tests and/or medications and counseling the patient, documenting clinical information in the  EHR or other health record, independently interpreting results and communicating results to the patient/family, discussing treatment and goals, answering patient's questions and coordinating care.  Cc:  Sharilyn Sites, MD  Sharene Butters 02/13/2022 10:58 AM

## 2022-02-15 DIAGNOSIS — R2689 Other abnormalities of gait and mobility: Secondary | ICD-10-CM | POA: Diagnosis not present

## 2022-02-15 DIAGNOSIS — M6281 Muscle weakness (generalized): Secondary | ICD-10-CM | POA: Diagnosis not present

## 2022-02-21 DIAGNOSIS — R2689 Other abnormalities of gait and mobility: Secondary | ICD-10-CM | POA: Diagnosis not present

## 2022-02-21 DIAGNOSIS — M6281 Muscle weakness (generalized): Secondary | ICD-10-CM | POA: Diagnosis not present

## 2022-02-28 DIAGNOSIS — M6281 Muscle weakness (generalized): Secondary | ICD-10-CM | POA: Diagnosis not present

## 2022-02-28 DIAGNOSIS — R2689 Other abnormalities of gait and mobility: Secondary | ICD-10-CM | POA: Diagnosis not present

## 2022-03-02 DIAGNOSIS — M6281 Muscle weakness (generalized): Secondary | ICD-10-CM | POA: Diagnosis not present

## 2022-03-02 DIAGNOSIS — R2689 Other abnormalities of gait and mobility: Secondary | ICD-10-CM | POA: Diagnosis not present

## 2022-03-06 DIAGNOSIS — M6281 Muscle weakness (generalized): Secondary | ICD-10-CM | POA: Diagnosis not present

## 2022-03-06 DIAGNOSIS — R2689 Other abnormalities of gait and mobility: Secondary | ICD-10-CM | POA: Diagnosis not present

## 2022-03-08 DIAGNOSIS — R2689 Other abnormalities of gait and mobility: Secondary | ICD-10-CM | POA: Diagnosis not present

## 2022-03-08 DIAGNOSIS — M6281 Muscle weakness (generalized): Secondary | ICD-10-CM | POA: Diagnosis not present

## 2022-03-13 DIAGNOSIS — M6281 Muscle weakness (generalized): Secondary | ICD-10-CM | POA: Diagnosis not present

## 2022-03-13 DIAGNOSIS — R2689 Other abnormalities of gait and mobility: Secondary | ICD-10-CM | POA: Diagnosis not present

## 2022-03-15 DIAGNOSIS — M6281 Muscle weakness (generalized): Secondary | ICD-10-CM | POA: Diagnosis not present

## 2022-03-15 DIAGNOSIS — R2689 Other abnormalities of gait and mobility: Secondary | ICD-10-CM | POA: Diagnosis not present

## 2022-03-20 DIAGNOSIS — M6281 Muscle weakness (generalized): Secondary | ICD-10-CM | POA: Diagnosis not present

## 2022-03-20 DIAGNOSIS — R2689 Other abnormalities of gait and mobility: Secondary | ICD-10-CM | POA: Diagnosis not present

## 2022-03-22 DIAGNOSIS — R2689 Other abnormalities of gait and mobility: Secondary | ICD-10-CM | POA: Diagnosis not present

## 2022-03-22 DIAGNOSIS — M6281 Muscle weakness (generalized): Secondary | ICD-10-CM | POA: Diagnosis not present

## 2022-03-27 DIAGNOSIS — M6281 Muscle weakness (generalized): Secondary | ICD-10-CM | POA: Diagnosis not present

## 2022-03-27 DIAGNOSIS — R2689 Other abnormalities of gait and mobility: Secondary | ICD-10-CM | POA: Diagnosis not present

## 2022-03-29 DIAGNOSIS — R2689 Other abnormalities of gait and mobility: Secondary | ICD-10-CM | POA: Diagnosis not present

## 2022-03-29 DIAGNOSIS — M6281 Muscle weakness (generalized): Secondary | ICD-10-CM | POA: Diagnosis not present

## 2022-03-31 ENCOUNTER — Inpatient Hospital Stay: Payer: PPO | Attending: Hematology

## 2022-03-31 VITALS — BP 118/47 | HR 62 | Temp 97.8°F | Resp 16

## 2022-03-31 DIAGNOSIS — Z85118 Personal history of other malignant neoplasm of bronchus and lung: Secondary | ICD-10-CM | POA: Insufficient documentation

## 2022-03-31 DIAGNOSIS — Z452 Encounter for adjustment and management of vascular access device: Secondary | ICD-10-CM | POA: Diagnosis not present

## 2022-03-31 DIAGNOSIS — Z95828 Presence of other vascular implants and grafts: Secondary | ICD-10-CM

## 2022-03-31 MED ORDER — HEPARIN SOD (PORK) LOCK FLUSH 100 UNIT/ML IV SOLN
500.0000 [IU] | Freq: Once | INTRAVENOUS | Status: AC
  Administered 2022-03-31: 500 [IU] via INTRAVENOUS

## 2022-03-31 MED ORDER — SODIUM CHLORIDE 0.9% FLUSH
10.0000 mL | INTRAVENOUS | Status: DC | PRN
  Administered 2022-03-31: 10 mL via INTRAVENOUS

## 2022-03-31 NOTE — Progress Notes (Signed)
Patients port flushed without difficulty.  Good blood return noted with no bruising or swelling noted at site.  Band aid applied.  VSS with discharge and left in satisfactory condition with no s/s of distress noted.   

## 2022-03-31 NOTE — Patient Instructions (Signed)
[facility]  Discharge Instructions: Thank you for choosing [facility] to provide your oncology and hematology care.  If you have a lab appointment with the Cancer Center, please come in thru the Main Entrance and check in at the main information desk.  Wear comfortable clothing and clothing appropriate for easy access to any Portacath or PICC line.   We strive to give you quality time with your provider. You may need to reschedule your appointment if you arrive late (15 or more minutes).  Arriving late affects you and other patients whose appointments are after yours.  Also, if you miss three or more appointments without notifying the office, you may be dismissed from the clinic at the provider's discretion.      For prescription refill requests, have your pharmacy contact our office and allow 72 hours for refills to be completed.    To help prevent nausea and vomiting after your treatment, we encourage you to take your nausea medication as directed.  BELOW ARE SYMPTOMS THAT SHOULD BE REPORTED IMMEDIATELY: *FEVER GREATER THAN 100.4 F (38 C) OR HIGHER *CHILLS OR SWEATING *NAUSEA AND VOMITING THAT IS NOT CONTROLLED WITH YOUR NAUSEA MEDICATION *UNUSUAL SHORTNESS OF BREATH *UNUSUAL BRUISING OR BLEEDING *URINARY PROBLEMS (pain or burning when urinating, or frequent urination) *BOWEL PROBLEMS (unusual diarrhea, constipation, pain near the anus) TENDERNESS IN MOUTH AND THROAT WITH OR WITHOUT PRESENCE OF ULCERS (sore throat, sores in mouth, or a toothache) UNUSUAL RASH, SWELLING OR PAIN  UNUSUAL VAGINAL DISCHARGE OR ITCHING   Items with * indicate a potential emergency and should be followed up as soon as possible or go to the Emergency Department if any problems should occur.  Please show the CHEMOTHERAPY ALERT CARD or IMMUNOTHERAPY ALERT CARD at check-in to the Emergency Department and triage nurse.  Should you have questions after your visit or need to  cancel or reschedule your appointment, please contact [facility] AT  [phone]  and follow the prompts.  Office hours are 8:00 a.m. to 4:30 p.m. Monday - Friday. Please note that voicemails left after 4:00 p.m. may not be returned until the following business day.  We are closed weekends and major holidays. You have access to a nurse at all times for urgent questions. Please call the main number to the clinic [phone] and follow the prompts.  For any non-urgent questions, you may also contact your provider using MyChart. We now offer e-Visits for anyone 18 and older to request care online for non-urgent symptoms. For details visit [email or web address].   Also download the MyChart app! Go to the app store, search "MyChart", open the app, select [facility], and log in with your MyChart username and password.   

## 2022-04-02 DIAGNOSIS — M6281 Muscle weakness (generalized): Secondary | ICD-10-CM | POA: Diagnosis not present

## 2022-04-07 DIAGNOSIS — M6281 Muscle weakness (generalized): Secondary | ICD-10-CM | POA: Diagnosis not present

## 2022-04-10 DIAGNOSIS — M6281 Muscle weakness (generalized): Secondary | ICD-10-CM | POA: Diagnosis not present

## 2022-04-17 DIAGNOSIS — M6281 Muscle weakness (generalized): Secondary | ICD-10-CM | POA: Diagnosis not present

## 2022-04-26 DIAGNOSIS — M6281 Muscle weakness (generalized): Secondary | ICD-10-CM | POA: Diagnosis not present

## 2022-04-27 ENCOUNTER — Encounter: Payer: Self-pay | Admitting: Radiology

## 2022-05-03 DIAGNOSIS — M6281 Muscle weakness (generalized): Secondary | ICD-10-CM | POA: Diagnosis not present

## 2022-05-10 DIAGNOSIS — M6281 Muscle weakness (generalized): Secondary | ICD-10-CM | POA: Diagnosis not present

## 2022-05-12 DIAGNOSIS — M6281 Muscle weakness (generalized): Secondary | ICD-10-CM | POA: Diagnosis not present

## 2022-05-15 DIAGNOSIS — M6281 Muscle weakness (generalized): Secondary | ICD-10-CM | POA: Diagnosis not present

## 2022-05-19 DIAGNOSIS — M6281 Muscle weakness (generalized): Secondary | ICD-10-CM | POA: Diagnosis not present

## 2022-05-24 DIAGNOSIS — M6281 Muscle weakness (generalized): Secondary | ICD-10-CM | POA: Diagnosis not present

## 2022-06-02 DIAGNOSIS — M6281 Muscle weakness (generalized): Secondary | ICD-10-CM | POA: Diagnosis not present

## 2022-06-05 DIAGNOSIS — M6281 Muscle weakness (generalized): Secondary | ICD-10-CM | POA: Diagnosis not present

## 2022-06-07 DIAGNOSIS — M6281 Muscle weakness (generalized): Secondary | ICD-10-CM | POA: Diagnosis not present

## 2022-06-12 DIAGNOSIS — M6281 Muscle weakness (generalized): Secondary | ICD-10-CM | POA: Diagnosis not present

## 2022-06-14 DIAGNOSIS — M6281 Muscle weakness (generalized): Secondary | ICD-10-CM | POA: Diagnosis not present

## 2022-06-19 DIAGNOSIS — M6281 Muscle weakness (generalized): Secondary | ICD-10-CM | POA: Diagnosis not present

## 2022-06-21 DIAGNOSIS — M6281 Muscle weakness (generalized): Secondary | ICD-10-CM | POA: Diagnosis not present

## 2022-06-26 DIAGNOSIS — M6281 Muscle weakness (generalized): Secondary | ICD-10-CM | POA: Diagnosis not present

## 2022-06-28 DIAGNOSIS — M6281 Muscle weakness (generalized): Secondary | ICD-10-CM | POA: Diagnosis not present

## 2022-06-29 ENCOUNTER — Ambulatory Visit (HOSPITAL_COMMUNITY)
Admission: RE | Admit: 2022-06-29 | Discharge: 2022-06-29 | Disposition: A | Discharge status: Home / Self Care | Payer: PPO | Source: Ambulatory Visit | Attending: Hematology | Admitting: Hematology

## 2022-06-29 ENCOUNTER — Inpatient Hospital Stay: Payer: PPO | Attending: Hematology

## 2022-06-29 VITALS — BP 127/60 | HR 58 | Temp 97.8°F | Resp 20

## 2022-06-29 DIAGNOSIS — Z87891 Personal history of nicotine dependence: Secondary | ICD-10-CM | POA: Diagnosis not present

## 2022-06-29 DIAGNOSIS — C3491 Malignant neoplasm of unspecified part of right bronchus or lung: Secondary | ICD-10-CM | POA: Insufficient documentation

## 2022-06-29 DIAGNOSIS — D509 Iron deficiency anemia, unspecified: Secondary | ICD-10-CM

## 2022-06-29 DIAGNOSIS — C3411 Malignant neoplasm of upper lobe, right bronchus or lung: Secondary | ICD-10-CM | POA: Diagnosis not present

## 2022-06-29 DIAGNOSIS — D649 Anemia, unspecified: Secondary | ICD-10-CM | POA: Insufficient documentation

## 2022-06-29 DIAGNOSIS — Z95828 Presence of other vascular implants and grafts: Secondary | ICD-10-CM

## 2022-06-29 DIAGNOSIS — J439 Emphysema, unspecified: Secondary | ICD-10-CM | POA: Diagnosis not present

## 2022-06-29 DIAGNOSIS — C3490 Malignant neoplasm of unspecified part of unspecified bronchus or lung: Secondary | ICD-10-CM | POA: Diagnosis not present

## 2022-06-29 LAB — CBC WITH DIFFERENTIAL/PLATELET
Abs Immature Granulocytes: 0.01 10*3/uL (ref 0.00–0.07)
Basophils Absolute: 0 10*3/uL (ref 0.0–0.1)
Basophils Relative: 0 %
Eosinophils Absolute: 0.2 10*3/uL (ref 0.0–0.5)
Eosinophils Relative: 5 %
HCT: 38.3 % — ABNORMAL LOW (ref 39.0–52.0)
Hemoglobin: 12.2 g/dL — ABNORMAL LOW (ref 13.0–17.0)
Immature Granulocytes: 0 %
Lymphocytes Relative: 14 %
Lymphs Abs: 0.6 10*3/uL — ABNORMAL LOW (ref 0.7–4.0)
MCH: 30.9 pg (ref 26.0–34.0)
MCHC: 31.9 g/dL (ref 30.0–36.0)
MCV: 97 fL (ref 80.0–100.0)
Monocytes Absolute: 0.3 10*3/uL (ref 0.1–1.0)
Monocytes Relative: 6 %
Neutro Abs: 3.2 10*3/uL (ref 1.7–7.7)
Neutrophils Relative %: 75 %
Platelets: 157 10*3/uL (ref 150–400)
RBC: 3.95 MIL/uL — ABNORMAL LOW (ref 4.22–5.81)
RDW: 14.2 % (ref 11.5–15.5)
WBC: 4.3 10*3/uL (ref 4.0–10.5)
nRBC: 0 % (ref 0.0–0.2)

## 2022-06-29 LAB — COMPREHENSIVE METABOLIC PANEL
ALT: 11 U/L (ref 0–44)
AST: 19 U/L (ref 15–41)
Albumin: 4.1 g/dL (ref 3.5–5.0)
Alkaline Phosphatase: 65 U/L (ref 38–126)
Anion gap: 9 (ref 5–15)
BUN: 18 mg/dL (ref 8–23)
CO2: 29 mmol/L (ref 22–32)
Calcium: 8.7 mg/dL — ABNORMAL LOW (ref 8.9–10.3)
Chloride: 95 mmol/L — ABNORMAL LOW (ref 98–111)
Creatinine, Ser: 0.96 mg/dL (ref 0.61–1.24)
GFR, Estimated: >60 mL/min (ref >60)
Glucose, Bld: 100 mg/dL — ABNORMAL HIGH (ref 70–99)
Potassium: 3.9 mmol/L (ref 3.5–5.1)
Sodium: 133 mmol/L — ABNORMAL LOW (ref 135–145)
Total Bilirubin: 0.9 mg/dL (ref 0.3–1.2)
Total Protein: 6.8 g/dL (ref 6.5–8.1)

## 2022-06-29 LAB — IRON AND TIBC
Iron: 74 ug/dL (ref 45–182)
Saturation Ratios: 27 % (ref 17.9–39.5)
TIBC: 273 ug/dL (ref 250–450)
UIBC: 199 ug/dL

## 2022-06-29 LAB — FERRITIN: Ferritin: 32 ng/mL (ref 24–336)

## 2022-06-29 MED ORDER — HEPARIN SOD (PORK) LOCK FLUSH 100 UNIT/ML IV SOLN: 500.0000 [IU] | Freq: Once | INTRAVENOUS | Status: DC

## 2022-06-29 MED ORDER — HEPARIN SOD (PORK) LOCK FLUSH 100 UNIT/ML IV SOLN
INTRAVENOUS | Status: AC
  Filled 2022-06-29: qty 5

## 2022-06-29 MED ORDER — SODIUM CHLORIDE 0.9% FLUSH
10.0000 mL | Freq: Once | INTRAVENOUS | Status: AC
  Administered 2022-06-29: 10 mL via INTRAVENOUS

## 2022-06-29 NOTE — Patient Instructions (Signed)
[facility]  Discharge Instructions: Thank you for choosing [facility] to provide your oncology and hematology care.  If you have a lab appointment with the Cancer Center - please note that after April 8th, 2024, all labs will be drawn in the cancer center.  You do not have to check in or register with the main entrance as you have in the past but will complete your check-in in the cancer center.  Wear comfortable clothing and clothing appropriate for easy access to any Portacath or PICC line.   We strive to give you quality time with your provider. You may need to reschedule your appointment if you arrive late (15 or more minutes).  Arriving late affects you and other patients whose appointments are after yours.  Also, if you miss three or more appointments without notifying the office, you may be dismissed from the clinic at the provider's discretion.      For prescription refill requests, have your pharmacy contact our office and allow 72 hours for refills to be completed.    Today you received the following port flush for CT scan, return as scheduled.   To help prevent nausea and vomiting after your treatment, we encourage you to take your nausea medication as directed.  BELOW ARE SYMPTOMS THAT SHOULD BE REPORTED IMMEDIATELY: *FEVER GREATER THAN 100.4 F (38 C) OR HIGHER *CHILLS OR SWEATING *NAUSEA AND VOMITING THAT IS NOT CONTROLLED WITH YOUR NAUSEA MEDICATION *UNUSUAL SHORTNESS OF BREATH *UNUSUAL BRUISING OR BLEEDING *URINARY PROBLEMS (pain or burning when urinating, or frequent urination) *BOWEL PROBLEMS (unusual diarrhea, constipation, pain near the anus) TENDERNESS IN MOUTH AND THROAT WITH OR WITHOUT PRESENCE OF ULCERS (sore throat, sores in mouth, or a toothache) UNUSUAL RASH, SWELLING OR PAIN  UNUSUAL VAGINAL DISCHARGE OR ITCHING   Items with * indicate a potential emergency and should be followed up as soon as possible or go to the Emergency  Department if any problems should occur.  Please show the CHEMOTHERAPY ALERT CARD or IMMUNOTHERAPY ALERT CARD at check-in to the Emergency Department and triage nurse.  Should you have questions after your visit or need to cancel or reschedule your appointment, please contact [facility] [phone]  and follow the prompts.  Office hours are 8:00 a.m. to 4:30 p.m. Monday - Friday. Please note that voicemails left after 4:00 p.m. may not be returned until the following business day.  We are closed weekends and major holidays. You have access to a nurse at all times for urgent questions. Please call the main number to the clinic [phone] and follow the prompts.  For any non-urgent questions, you may also contact your provider using MyChart. We now offer e-Visits for anyone 70 and older to request care online for non-urgent symptoms. For details visit [email or web address].   Also download the MyChart app! Go to the app store, search "MyChart", open the app, select [facility], and log in with your MyChart username and password.

## 2022-06-29 NOTE — Progress Notes (Signed)
Port flushed with good blood return noted. No bruising or swelling at site. Patient left accessed for CT and patient discharged in satisfactory condition. VVS stable with no signs or symptoms of distressed noted. 

## 2022-07-03 DIAGNOSIS — M6281 Muscle weakness (generalized): Secondary | ICD-10-CM | POA: Diagnosis not present

## 2022-07-05 DIAGNOSIS — M6281 Muscle weakness (generalized): Secondary | ICD-10-CM | POA: Diagnosis not present

## 2022-07-05 NOTE — Progress Notes (Signed)
[facility] [address] [place] [address]    Clinic Day:  07/06/2022  Referring physician: Assunta Found, MD  Patient Care Team: Assunta Found, MD as PCP - General (Family Medicine) Jena Gauss Gerrit Friends, MD (Gastroenterology) Gwynneth Munson Corrie Dandy (Neurology)   ASSESSMENT & PLAN:   Assessment: 1.  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 -Durvalumab consolidation from 09/19/2017 through 09/17/2018. -Denies any cough or hemoptysis. -I reviewed CT chest with contrast from 06/16/2019 which showed 9 mm nodule in the medial right upper lobe unchanged.  Adjacent radiation changes.  No evidence of progression or metastatic disease. -CT chest on 12/16/2019 showed stable radiation changes and small nodule in the medial right upper lobe with no signs of recurrence.  New bandlike area of septal thickening and micro nodularity in the right lateral and left anterior upper chest, most suggestive of sequela of infection/inflammation.  Signs of hepatic cirrhosis.    Plan: 1.  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 assisted living facility after stroke. - Reviewed CT chest from 06/29/2022: Radiation changes in the medial right upper lobe with no evidence of recurrence or metastatic disease. - Labs today: Normal LFTs and creatinine.  CBC was grossly normal. - Recommend follow-up in 6 months with repeat CT chest without contrast.   2.  Hypothyroidism: - He is currently on Synthroid 100 mcg daily. - Recommend follow-up TSH with his PMD.  3.  Normocytic anemia: - Hemoglobin today is 12.2.  Ferritin is 32 and percent saturation 27.  He has received Feraheme in the past. - Recommend iron tablet daily.  Will check ferritin and iron panel next visit.    Orders Placed This Encounter  Procedures   CT Chest Wo Contrast    Standing Status:    Future    Standing Expiration Date:   07/06/2023    Order Specific Question:   Preferred imaging location?    Answer:   [facility]   CBC with Differential    Standing Status:   Future    Standing Expiration Date:   07/06/2023   Comprehensive metabolic panel    Standing Status:   Future    Standing Expiration Date:   07/06/2023   Iron and TIBC ([facility] ONLY)    Standing Status:   Future    Standing Expiration Date:   07/06/2023   Ferritin    Standing Status:   Future    Standing Expiration Date:   07/06/2023   TSH    Standing Status:   Future    Standing Expiration Date:   07/06/2023      I,Katie Daubenspeck,acting as a scribe for Doreatha Massed, MD.,have documented all relevant documentation on the behalf of Doreatha Massed, MD,as directed by  Doreatha Massed, MD while in the presence of Doreatha Massed, MD.   I, Doreatha Massed MD, have reviewed the above documentation for accuracy and completeness, and I agree with the above.   Doreatha Massed, MD   5/9/202412:46 PM  CHIEF COMPLAINT:   Diagnosis: stage II right upper lobe adenocarcinoma    Cancer Staging  No matching staging information was found for the patient.   Prior Therapy: 1. Chemoradiation with carboplatin and paclitaxel weekly from 06/11/2017 to 07/16/2017. 2. Consolidation with durvalumab from 09/19/2017 to 09/17/2018.  Current Therapy:  surveillance   HISTORY OF PRESENT ILLNESS:   Oncology History  Carcinoma, lung (HCC)  06/05/2017 Initial Diagnosis   Carcinoma, lung (HCC)   06/11/2017 - 07/16/2017 Chemotherapy   The patient had dexamethasone (DECADRON) 4 MG tablet, 8 mg, Oral, Daily, 1 of 1 cycle, Start date: --, End date: -- palonosetron (ALOXI) injection 0.25 mg, 0.25 mg, Intravenous,  Once, 6 of 6 cycles Administration: 0.25 mg (06/11/2017), 0.25 mg (07/09/2017), 0.25 mg (07/16/2017), 0.25 mg (06/18/2017), 0.25 mg (06/25/2017), 0.25 mg (07/02/2017) CARBOplatin  (PARAPLATIN) 190 mg in sodium chloride 0.9 % 250 mL chemo infusion, 190 mg (100 % of original dose 192.8 mg), Intravenous,  Once, 6 of 6 cycles Dose modification:   (original dose 192.8 mg, Cycle 1),   (original dose 192.8 mg, Cycle 5), 144.6 mg (original dose 144.6 mg, Cycle 6),   (original dose 192.8 mg, Cycle 2),   (original dose 192.8 mg, Cycle 3),   (original dose 192.8 mg, Cycle 4) Administration: 190 mg (06/11/2017), 190 mg (07/09/2017), 140 mg (07/16/2017), 190 mg (06/18/2017), 190 mg (06/25/2017), 190 mg (07/02/2017) PACLitaxel (TAXOL) 90 mg in dextrose 5 % 250 mL chemo infusion (</= 80mg /m2), 45 mg/m2 = 90 mg, Intravenous,  Once, 6 of 6 cycles Dose modification: 40.5 mg/m2 (90 % of original dose 45 mg/m2, Cycle 5, Reason: Other (see comments), Comment: early neuropathy), 36 mg/m2 (80 % of original dose 45 mg/m2, Cycle 6, Reason: Provider Judgment) Administration: 90 mg (06/11/2017), 78 mg (07/09/2017), 72 mg (07/16/2017), 90 mg (06/18/2017), 78 mg (06/25/2017), 78 mg (07/02/2017)  for chemotherapy treatment.    Malignant neoplasm of bronchus and lung (HCC)  06/08/2017 Initial Diagnosis   Malignant neoplasm of bronchus and lung (HCC)   09/20/2017 - 09/17/2018 Chemotherapy   Patient is on Treatment Plan : LUNG DURVALUMAB Q14D        INTERVAL HISTORY:   Ian Aguilar is a 82 y.o. male presenting to clinic today for follow up of stage II right upper lobe adenocarcinoma. He was last seen by me on 12/29/21.  Since his last visit, he underwent surveillance chest CT on 06/29/22 showing: stable exam-- radiation changed in medial RUL with unchanged underlying 9 mm subpleural nodule, unchanged ground-glass nodules in RLL.  Of note, he was also seen in the ED on 01/10/22 following a fall. Work up showed no acute findings except for mild prepatellar effusion in right knee.  Today, he states that he is doing well overall. His appetite level is at 100%. His energy level is at 75%.  PAST MEDICAL HISTORY:   Past Medical  History: Past Medical History:  Diagnosis Date   Anxiety    Arthritis    Cancer (HCC)    Cirrhosis (HCC)    confirmed by MRI on 07/04/11.     COPD (chronic obstructive pulmonary disease) (HCC)    Depression with anxiety    ETOH abuse    Hyperlipidemia    Hypertension    diet control   Nodule of upper lobe of right lung    with mediastinal adenopathy   Wears dentures    Wears glasses     Surgical History: Past Surgical History:  Procedure Laterality Date   bilateral cataract surgery     [place]   broken arm  6 yrs ago   left otif of wrist-[place]   CLOSED REDUCTION WRIST FRACTURE Right 09/02/2015   Procedure: RIGHT WRIST REDUCTION;  Surgeon: Sheral Apley, MD;  Location: [facility] OR;  Service: Orthopedics;  Laterality: Right;  with MAC   COLONOSCOPY  1996   [place]: external hemorrhoids, no polyps  COLONOSCOPY  06/28/2011   Dr. Claudius Sis diverticulosis,tubular adenoma, hyperplastic polyp   COLONOSCOPY WITH PROPOFOL N/A 04/26/2017   Dr. Jena Gauss: perianal and digital rectal examinations normal, diffusely congested colonic mucosa but otherwise normal   ESOPHAGOGASTRODUODENOSCOPY  06/28/2011   Dr. Dellie Catholic erosive reflux esophagitis, hiatal hernia-gastritis   ESOPHAGOGASTRODUODENOSCOPY (EGD) WITH PROPOFOL N/A 04/26/2017   Dr. Jena Gauss: normal esophagus, small hiatal hernia, GAVE, portal hypertensive gastropathy, normal duodenal bulb and second portion, no specimens collected. 2 years screening    ESOPHAGOGASTRODUODENOSCOPY (EGD) WITH PROPOFOL N/A 06/19/2019   normal esophagus, portal gastropathy, GAVE, normal duodenum. 2 years screening.    EXTERNAL EAR SURGERY     right ear-cleaned out ear and created new eardrum   INGUINAL HERNIA REPAIR  2-3 yrs ago   left-[place]   INTRAMEDULLARY (IM) NAIL INTERTROCHANTERIC Right 09/02/2015   Procedure: RIGHT  INTERTROCHANTRIC HIP;  Surgeon: Sheral Apley, MD;  Location: [facility] OR;  Service: Orthopedics;  Laterality: Right;  With MAC   KNEE  SURGERY     left-arthroscopy-[place]   PORTACATH PLACEMENT Left 06/08/2017   Procedure: INSERTION POWER PORT WITH  ATTACHED CATHETER LEFT SUBCLAVIAN;  Surgeon: Franky Macho, MD;  Location: [facility] ORS;  Service: General;  Laterality: Left;   SKULL FRACTURE ELEVATION     fractured cheeck bone repaired   TOTAL HIP ARTHROPLASTY  12/18/2011   Procedure: TOTAL HIP ARTHROPLASTY;  Surgeon: Vickki Hearing, MD;  Location: [facility];  Service: Orthopedics;  Laterality: Left;   VIDEO BRONCHOSCOPY WITH ENDOBRONCHIAL ULTRASOUND N/A 05/04/2017   Procedure: VIDEO BRONCHOSCOPY WITH ENDOBRONCHIAL ULTRASOUND;  Surgeon: Loreli Slot, MD;  Location: [facility] OR;  Service: Thoracic;  Laterality: N/A;   WISDOM TOOTH EXTRACTION      Social History: Social History   Socioeconomic History   Marital status: Widowed    Spouse name: Not on file   Number of children: 2   Years of education: 5   Highest education level: Not on file  Occupational History   Occupation: retired    Associate Professor: [employer]  Tobacco Use   Smoking status: Former    Packs/day: 0.00    Years: 62.00    Additional pack years: 0.00    Total pack years: 0.00    Types: Cigarettes    Quit date: 02/28/2019    Years since quitting: 3.3   Smokeless tobacco: Never   Tobacco comments:    1/2 ppd > 60 years as of 06/27/17: 4-5 cigarettes or less a day  Vaping Use   Vaping Use: Never used  Substance and Sexual Activity   Alcohol use: Not Currently    Alcohol/week: 2.0 standard drinks of alcohol    Types: 2 Cans of beer per week   Drug use: No   Sexual activity: Yes    Birth control/protection: None  Other Topics Concern   Not on file  Social History Narrative   Right handed   Drinks caffeine   Assistant living   Retired   Has one son and daughter    Social Determinants of Health   Financial Resource Strain: Not on file  Food Insecurity: Not on file  Transportation Needs: Not on file  Physical Activity: Not on file   Stress: Not on file  Social Connections: Not on file  Intimate Partner Violence: Not on file    Family History: Family History  Problem Relation Age of Onset   Stroke Mother 61   Heart disease Father 62       MI  Diabetes Maternal Uncle    Colon cancer Neg Hx    Cancer Neg Hx     Current Medications:  Current Outpatient Medications:    albuterol (VENTOLIN HFA) 108 (90 Base) MCG/ACT inhaler, Inhale 90 mcg into the lungs 4 (four) times daily as needed., Disp: , Rfl:    cholecalciferol (VITAMIN D) 1000 units tablet, Take 1,000 Units by mouth daily., Disp: , Rfl:    docusate sodium (COLACE) 100 MG capsule, Take 100 mg by mouth 2 (two) times daily., Disp: , Rfl:    donepezil (ARICEPT) 10 MG tablet, TAKE 1 TABLET BY MOUTH AT BEDTIME., Disp: 30 tablet, Rfl: 4   doxycycline (VIBRAMYCIN) 100 MG capsule, Take 1 capsule (100 mg total) by mouth 2 (two) times daily., Disp: 20 capsule, Rfl: 0   feeding supplement (ENSURE ENLIVE / ENSURE PLUS) LIQD, Take 237 mLs by mouth 2 (two) times daily between meals., Disp: , Rfl:    GOODSENSE ARTIFICIAL TEARS 0.5-0.6 % SOLN, Apply to eye., Disp: , Rfl:    guaiFENesin (MUCUS RELIEF ADULT PO), Take by mouth., Disp: , Rfl:    Lactobacillus-Inulin (CULTURELLE DIGESTIVE DAILY PO), Take by mouth daily., Disp: , Rfl:    levothyroxine (SYNTHROID) 125 MCG tablet, TAKE (1) TABLET BY MOUTH ONCE DAILY ON AN EMPTY STOMACH., Disp: 30 tablet, Rfl: 5   lidocaine-prilocaine (EMLA) cream, Apply 1 application topically as needed., Disp: 30 g, Rfl: 3   loperamide (IMODIUM) 2 MG capsule, Take by mouth as needed for diarrhea or loose stools., Disp: , Rfl:    methylPREDNISolone (MEDROL DOSEPAK) 4 MG TBPK tablet, Take as directed, Disp: 1 each, Rfl: 0   NON FORMULARY, Diet NAS, Disp: , Rfl:    OXYGEN, Inhale 2 L into the lungs continuous., Disp: , Rfl:    Psyllium (METAMUCIL PO), Take by mouth. 1 capsule twice daily, Disp: , Rfl:    rosuvastatin (CRESTOR) 10 MG tablet, Take 1  tablet (10 mg total) by mouth daily., Disp: 30 tablet, Rfl: 0   SPIRIVA RESPIMAT 2.5 MCG/ACT AERS, Inhale 2 puffs into the lungs daily., Disp: , Rfl:    triamcinolone (KENALOG) 0.025 % ointment, Apply 1 application topically 2 (two) times daily. Apply to face and arms sparingly for dryness, Disp: , Rfl:    vitamin B-12 (CYANOCOBALAMIN) 1000 MCG tablet, Take 1 tablet (1,000 mcg total) by mouth daily., Disp: 30 tablet, Rfl: 6   gabapentin (NEURONTIN) 100 MG capsule, Take 1 capsule (100 mg total) by mouth 3 (three) times daily for 14 days. (Patient not taking: Reported on 06/27/2021), Disp: 42 capsule, Rfl: 0   Allergies: No Known Allergies  REVIEW OF SYSTEMS:   Review of Systems  Constitutional:  Negative for chills, fatigue and fever.  HENT:   Negative for lump/mass, mouth sores, nosebleeds, sore throat and trouble swallowing.   Eyes:  Negative for eye problems.  Respiratory:  Positive for cough and shortness of breath.   Cardiovascular:  Negative for chest pain, leg swelling and palpitations.  Gastrointestinal:  Negative for abdominal pain, constipation, diarrhea, nausea and vomiting.  Genitourinary:  Negative for bladder incontinence, difficulty urinating, dysuria, frequency, hematuria and nocturia.   Musculoskeletal:  Negative for arthralgias, back pain, flank pain, myalgias and neck pain.  Skin:  Negative for itching and rash.  Neurological:  Negative for dizziness, headaches and numbness.  Hematological:  Does not bruise/bleed easily.  Psychiatric/Behavioral:  Negative for depression, sleep disturbance and suicidal ideas. The patient is not nervous/anxious.   All other systems reviewed and  are negative.    VITALS:   Blood pressure (!) 142/68, pulse (!) 56, temperature 98.3 F (36.8 C), temperature source Oral, resp. rate 19, height 5\' 11"  (1.803 m), weight 150 lb 9.6 oz (68.3 kg), SpO2 98 %.  Wt Readings from Last 3 Encounters:  07/06/22 150 lb 9.6 oz (68.3 kg)  01/10/22 153 lb  12.8 oz (69.8 kg)  12/29/21 153 lb 12.8 oz (69.8 kg)    Body mass index is 21 kg/m.  Performance status (ECOG): 1 - Symptomatic but completely ambulatory  PHYSICAL EXAM:   Physical Exam Vitals and nursing note reviewed. Exam conducted with a chaperone present.  Constitutional:      Appearance: Normal appearance.  Cardiovascular:     Rate and Rhythm: Normal rate and regular rhythm.     Pulses: Normal pulses.     Heart sounds: Normal heart sounds.  Pulmonary:     Effort: Pulmonary effort is normal.     Breath sounds: Normal breath sounds.  Abdominal:     Palpations: Abdomen is soft. There is no hepatomegaly, splenomegaly or mass.     Tenderness: There is no abdominal tenderness.  Musculoskeletal:     Right lower leg: No edema.     Left lower leg: No edema.  Lymphadenopathy:     Cervical: No cervical adenopathy.     Right cervical: No superficial, deep or posterior cervical adenopathy.    Left cervical: No superficial, deep or posterior cervical adenopathy.     Upper Body:     Right upper body: No supraclavicular or axillary adenopathy.     Left upper body: No supraclavicular or axillary adenopathy.  Neurological:     General: No focal deficit present.     Mental Status: He is alert and oriented to person, place, and time.  Psychiatric:        Mood and Affect: Mood normal.        Behavior: Behavior normal.     LABS:      Latest Ref Rng & Units 06/29/2022    1:48 PM 12/22/2021   12:15 PM 07/11/2021    4:00 PM  CBC  WBC 4.0 - 10.5 K/uL 4.3  4.1  11.5   Hemoglobin 13.0 - 17.0 g/dL 16.1  09.6  04.5   Hematocrit 39.0 - 52.0 % 38.3  36.2  35.0   Platelets 150 - 400 K/uL 157  167  125       Latest Ref Rng & Units 06/29/2022    1:48 PM 12/22/2021    1:01 PM 12/22/2021   12:15 PM  CMP  Glucose 70 - 99 mg/dL 409   91   BUN 8 - 23 mg/dL 18   19   Creatinine 8.11 - 1.24 mg/dL 9.14  7.82  9.56   Sodium 135 - 145 mmol/L 133   140   Potassium 3.5 - 5.1 mmol/L 3.9   4.4    Chloride 98 - 111 mmol/L 95   103   CO2 22 - 32 mmol/L 29   33   Calcium 8.9 - 10.3 mg/dL 8.7   9.2   Total Protein 6.5 - 8.1 g/dL 6.8   6.9   Total Bilirubin 0.3 - 1.2 mg/dL 0.9   0.6   Alkaline Phos 38 - 126 U/L 65   69   AST 15 - 41 U/L 19   18   ALT 0 - 44 U/L 11   11      No results found for: "  CEA1", "CEA" / No results found for: "CEA1", "CEA" No results found for: "PSA1" No results found for: "CAN199" No results found for: "CAN125"  Lab Results  Component Value Date   TOTALPROTELP 6.6 01/03/2021   ALBUMINELP 3.7 01/03/2021   A1GS 0.3 01/03/2021   A2GS 0.8 01/03/2021   BETS 1.1 01/03/2021   GAMS 0.7 01/03/2021   MSPIKE Not Observed 01/03/2021   SPEI Comment 01/03/2021   Lab Results  Component Value Date   TIBC 273 06/29/2022   TIBC 251 06/16/2021   TIBC 242 (L) 04/13/2021   FERRITIN 32 06/29/2022   FERRITIN 205 06/16/2021   FERRITIN 219 04/13/2021   IRONPCTSAT 27 06/29/2022   IRONPCTSAT 18 06/16/2021   IRONPCTSAT 41 (H) 04/13/2021   Lab Results  Component Value Date   LDH 134 01/03/2021   LDH 136 06/11/2017   LDH 152 05/22/2017     STUDIES:   CT Chest Wo Contrast  Result Date: 07/03/2022 CLINICAL DATA:  Follow-up non-small cell lung cancer EXAM: CT CHEST WITHOUT CONTRAST TECHNIQUE: Multidetector CT imaging of the chest was performed following the standard protocol without IV contrast. RADIATION DOSE REDUCTION: This exam was performed according to the departmental dose-optimization program which includes automated exposure control, adjustment of the mA and/or kV according to patient size and/or use of iterative reconstruction technique. COMPARISON:  12/22/2021 FINDINGS: Cardiovascular: The heart is top-normal in size. No pericardial effusion. No evidence of thoracic aortic aneurysm. Atherosclerotic calcifications of the aortic arch. Severe three-coronary atherosclerosis. Mediastinum/Nodes: No suspicious mediastinal lymphadenopathy. Lungs/Pleura: Radiation  changes in the medial right upper lobe. Underlying 9 mm subpleural nodule (series 4/image 37), unchanged. Moderate centrilobular and paraseptal emphysematous changes, upper lung predominant. Faint ground-glass nodularity in left upper lobe (series 4/image 85) is grossly unchanged, favoring scarring. 2.9 x 2.0 cm ground-glass nodule in the medial right lower lobe (series 4/image 87), previously 2.8 x 2.1 cm when remeasured in a similar fashion, unchanged. Additional 11 mm ground-glass nodule in the central right lower lobe (series 4/image 95), unchanged. Faint peribronchovascular nodularity in the posterior left lower lobe (series 4/image 88) is improved, favoring post infectious inflammatory scarring. Mild scarring/atelectasis in the posterior lingula. No focal consolidation. No pleural effusion or pneumothorax. Upper Abdomen: Visualized upper abdomen is notable for cirrhosis and vascular calcifications. Musculoskeletal: Mild superior endplate compression fracture deformity at L1, unchanged. IMPRESSION: Radiation changes in the medial right upper lobe. Underlying 9 mm subpleural nodule, unchanged. Ground-glass nodules in the right lower lobe, as above, unchanged. Continued attention on follow-up is suggested. Additional stable ancillary findings as above. Aortic Atherosclerosis (ICD10-I70.0) and Emphysema (ICD10-J43.9). Electronically Signed   By: Charline Bills M.D.   On: 07/03/2022 22:19

## 2022-07-06 ENCOUNTER — Inpatient Hospital Stay (HOSPITAL_BASED_OUTPATIENT_CLINIC_OR_DEPARTMENT_OTHER): Payer: PPO | Admitting: Hematology

## 2022-07-06 VITALS — BP 138/67 | HR 56 | Temp 98.3°F | Resp 19 | Ht 71.0 in | Wt 150.6 lb

## 2022-07-06 DIAGNOSIS — D509 Iron deficiency anemia, unspecified: Secondary | ICD-10-CM

## 2022-07-06 DIAGNOSIS — C3411 Malignant neoplasm of upper lobe, right bronchus or lung: Secondary | ICD-10-CM | POA: Diagnosis not present

## 2022-07-06 DIAGNOSIS — C3491 Malignant neoplasm of unspecified part of right bronchus or lung: Secondary | ICD-10-CM

## 2022-07-06 NOTE — Patient Instructions (Addendum)
[facility] at [facility] Discharge Instructions   You were seen and examined today by Dr. Ellin Saba.  He reviewed the results of your CT scan which is normal/stable.   He reviewed the results of your lab work which is normal/stable. Your iron has dropped some since we last checked. Try taking an iron pill (325 mg) daily to help improve your iron.  We will see you back in 6 months. We will repeat lab work and a CT scan prior to your next visit.    Thank you for choosing [facility] at [facility] to provide your oncology and hematology care.  To afford each patient quality time with our provider, please arrive at least 15 minutes before your scheduled appointment time.   If you have a lab appointment with the Cancer Center please come in thru the Main Entrance and check in at the main information desk.  You need to re-schedule your appointment should you arrive 10 or more minutes late.  We strive to give you quality time with our providers, and arriving late affects you and other patients whose appointments are after yours.  Also, if you no show three or more times for appointments you may be dismissed from the clinic at the providers discretion.     Again, thank you for choosing [facility].  Our hope is that these requests will decrease the amount of time that you wait before being seen by our physicians.       _____________________________________________________________  Should you have questions after your visit to [facility], please contact our office at [phone] and follow the prompts.  Our office hours are 8:00 a.m. and 4:30 p.m. Monday - Friday.  Please note that voicemails left after 4:00 p.m. may not be returned until the following business day.  We are closed weekends and major holidays.  You do have access to a nurse 24-7, just call the main number to the clinic [phone] and do not press any options,  hold on the line and a nurse will answer the phone.    For prescription refill requests, have your pharmacy contact our office and allow 72 hours.    Due to Covid, you will need to wear a mask upon entering the hospital. If you do not have a mask, a mask will be given to you at the Main Entrance upon arrival. For doctor visits, patients may have 1 support person age 55 or older with them. For treatment visits, patients can not have anyone with them due to social distancing guidelines and our immunocompromised population.

## 2022-07-12 DIAGNOSIS — M6281 Muscle weakness (generalized): Secondary | ICD-10-CM | POA: Diagnosis not present

## 2022-07-17 DIAGNOSIS — M6281 Muscle weakness (generalized): Secondary | ICD-10-CM | POA: Diagnosis not present

## 2022-07-19 DIAGNOSIS — M6281 Muscle weakness (generalized): Secondary | ICD-10-CM | POA: Diagnosis not present

## 2022-07-21 DIAGNOSIS — J449 Chronic obstructive pulmonary disease, unspecified: Secondary | ICD-10-CM | POA: Diagnosis not present

## 2022-07-21 DIAGNOSIS — Z9981 Dependence on supplemental oxygen: Secondary | ICD-10-CM | POA: Diagnosis not present

## 2022-07-21 DIAGNOSIS — Z6821 Body mass index (BMI) 21.0-21.9, adult: Secondary | ICD-10-CM | POA: Diagnosis not present

## 2022-07-21 DIAGNOSIS — E039 Hypothyroidism, unspecified: Secondary | ICD-10-CM | POA: Diagnosis not present

## 2022-07-31 DIAGNOSIS — M6281 Muscle weakness (generalized): Secondary | ICD-10-CM | POA: Diagnosis not present

## 2022-08-04 DIAGNOSIS — M6281 Muscle weakness (generalized): Secondary | ICD-10-CM | POA: Diagnosis not present

## 2022-08-07 DIAGNOSIS — M6281 Muscle weakness (generalized): Secondary | ICD-10-CM | POA: Diagnosis not present

## 2022-08-09 DIAGNOSIS — M6281 Muscle weakness (generalized): Secondary | ICD-10-CM | POA: Diagnosis not present

## 2022-08-11 DIAGNOSIS — M6281 Muscle weakness (generalized): Secondary | ICD-10-CM | POA: Diagnosis not present

## 2022-08-15 ENCOUNTER — Encounter: Payer: Self-pay | Admitting: Physician Assistant

## 2022-08-15 ENCOUNTER — Ambulatory Visit (INDEPENDENT_AMBULATORY_CARE_PROVIDER_SITE_OTHER): Payer: PPO | Admitting: Physician Assistant

## 2022-08-15 VITALS — BP 130/80 | HR 80 | Resp 20 | Ht 71.0 in | Wt 155.0 lb

## 2022-08-15 DIAGNOSIS — G3184 Mild cognitive impairment, so stated: Secondary | ICD-10-CM

## 2022-08-15 NOTE — Progress Notes (Signed)
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  Ian Aguilar is a very pleasant 82 y.o. 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 Patient is on donepezil 10 mg daily. Memory is stable, MMSE today is 25/30.    Recommendations:   Follow up in 6  months. Continue donepezil 10 mg daily. Side effects were discussed  Recommend good control of cardiovascular risk factors Continue to control mood as per PCP    Subjective:   This patient is accompanied in the office by his caregiver  who supplements the history. Previous records as well as any outside records available were reviewed prior to todays visit.   Patient was last seen on 02/13/22 with MMSE 26/30      Any changes in memory since last visit?  He enjoys doing crossword puzzles for about 2 hours a day and bingo 2 times a week, enjoys Wheel of [address] and [address].  He also enjoys interacting with the residents at the facility. She has made a lady friend, former HS sweetheart.  repeats oneself? Denies  Disoriented when walking into a room?  Patient denies  Leaving objects in unusual places?  Occasionally misplaces things but not on unusual places   Wandering behavior?   denies   Any personality changes since last visit?  denies   Any worsening depression?: denies   Hallucinations or paranoia?  denies   Seizures?   denies    Any sleep changes?  Sleeps well Denies  vivid dreams, REM behavior or sleepwalking   Sleep apnea?   denies   Any hygiene concerns?   denies   Independent of bathing and dressing?  He needs assistance due to gait instability. Does the patient needs help with medications?  Facility is in charge    Who is in charge of the finances?  Son and daughter are in charge     Any changes in appetite?  denies     Patient have trouble swallowing?  denies   Does the patient cook?  No Any headaches?    denies   Vision changes? denies Chronic back pain  denies   Ambulates with difficulty?  Uses a walker, occasionally a wheelchair to carry oxygen need to go somewhere.  He does PT at the facility   Recent falls or head injuries?    denies      Unilateral weakness, numbness or tingling?   denies   Any tremors?  denies   Any anosmia?    denies   Any incontinence of urine?  denies   Any bowel dysfunction?  denies      Patient lives at [facility] long-term care center in [place]  Does the patient drive?  He no longer drives      Initial Visit 09/27/20 The patient is seen in neurologic consultation at the request of Elfredia Nevins, MD for the evaluation of memory.  The patient is accompanied by caregiver who supplements the history. He is a 82 y.o. year old RH  male who has had memory issues for about  several years, worse over the last 6 months, when he lost his wife.  At the time, he went into a deep depression, as he was planning  to move with her at [facility] in [place], and he was going to take his dog with him.  On his wife's death, he felt "lost, did not want to get out of the bed, did not want to interact with anyone, and he was feeling very irritable ".  Sertraline was tried, without any improvement.  It was during that time, that he was found to be severely hypothyroid with a TSH of 17, and was started on thyroid medication, which improved significantly his mood, and irritability.  He reports his memory being "not good, especially with long-term memory ".  He states that at times he cannot remember the names of his grandmother, grandfather, or children. While before he was sleeping the whole day, now he sleeps well at night, without vivid dreams or sleepwalking, paranoia or  hallucinations.  Occasionally he takes a nap.He needs assistance with bathing and dressing, as his gait is chronically unstable.  Medications are given by the facility staff, and the finances are managed by his son and daughter who is the power of attorney.  Denies leaving objects in unusual places.  Appetite is better, denies trouble swallowing.  He does not cook.  "My wife used to do all the cooking".  He ambulates with the walker very short distances, otherwise he uses a wheelchair to help move from one side to the other, as he is very weak, and has an oxygen tank with him.  He does participate in physical therapy at the facility.  He denies any recent falls.  He no longer drives because of "memory issues ".  His son "was afraid that I would get lost so he took the keys".  He denies any headaches, although he had an injury to the head in May 2022, with negative work-up at the ER.  He denies double vision, dizziness, focal numbness or tingling, unilateral weakness or tremors, urine incontinence or retention.  Denies constipation, diarrhea, or anosmia.  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  He quit heavy alcohol 10 years ago mostly beer and bourbon.  He quit 1 pack a day tobacco use for most of his life.  Family history remarkable for dementia in father.     Labs 07/19/20  CBC and CMP unremarkable UA neg  TSH 05/13/20 17.277 High, T4 1.28 elevated B12 04/22/20 645 nl    CT head wo contrast 07/19/20 No evidence of acute intracranial abnormality.Right anterior scalp hematoma.Stable mild-to-moderate cerebral atrophy and cerebral white matter chronic small vessel ischemic disease. Known small chronic right cerebellar infarct.Mild paranasal sinus mucosal thickening at the imaged levels, most notably right maxillary. Right mastoid effusion. Past Medical History:  Diagnosis Date   Anxiety    Arthritis    Cancer (HCC)    Cirrhosis (HCC)    confirmed by MRI on 07/04/11.     COPD (chronic  obstructive pulmonary disease) (HCC)    Depression with anxiety    ETOH abuse    Hyperlipidemia    Hypertension    diet control   Nodule of upper lobe of right lung    with mediastinal adenopathy   Wears dentures    Wears glasses      Past Surgical History:  Procedure Laterality Date   bilateral cataract surgery     [place]   broken arm  6 yrs ago   left otif of wrist-[place]   CLOSED REDUCTION WRIST FRACTURE Right 09/02/2015   Procedure: RIGHT WRIST REDUCTION;  Surgeon: Marcial Pacas  Jamison Neighbor, MD;  Location: [facility] OR;  Service: Orthopedics;  Laterality: Right;  with MAC   COLONOSCOPY  1996   [facility]: external hemorrhoids, no polyps   COLONOSCOPY  06/28/2011   Dr. Claudius Sis diverticulosis,tubular adenoma, hyperplastic polyp   COLONOSCOPY WITH PROPOFOL N/A 04/26/2017   Dr. Jena Gauss: perianal and digital rectal examinations normal, diffusely congested colonic mucosa but otherwise normal   ESOPHAGOGASTRODUODENOSCOPY  06/28/2011   Dr. Dellie Catholic erosive reflux esophagitis, hiatal hernia-gastritis   ESOPHAGOGASTRODUODENOSCOPY (EGD) WITH PROPOFOL N/A 04/26/2017   Dr. Jena Gauss: normal esophagus, small hiatal hernia, GAVE, portal hypertensive gastropathy, normal duodenal bulb and second portion, no specimens collected. 2 years screening    ESOPHAGOGASTRODUODENOSCOPY (EGD) WITH PROPOFOL N/A 06/19/2019   normal esophagus, portal gastropathy, GAVE, normal duodenum. 2 years screening.    EXTERNAL EAR SURGERY     right ear-cleaned out ear and created new eardrum   INGUINAL HERNIA REPAIR  2-3 yrs ago   left-[place]   INTRAMEDULLARY (IM) NAIL INTERTROCHANTERIC Right 09/02/2015   Procedure: RIGHT  INTERTROCHANTRIC HIP;  Surgeon: Sheral Apley, MD;  Location: [facility] OR;  Service: Orthopedics;  Laterality: Right;  With MAC   KNEE SURGERY     left-arthroscopy-[place]   PORTACATH PLACEMENT Left 06/08/2017   Procedure: INSERTION POWER PORT WITH  ATTACHED CATHETER LEFT SUBCLAVIAN;  Surgeon: Franky Macho, MD;   Location: [facility];  Service: General;  Laterality: Left;   SKULL FRACTURE ELEVATION     fractured cheeck bone repaired   TOTAL HIP ARTHROPLASTY  12/18/2011   Procedure: TOTAL HIP ARTHROPLASTY;  Surgeon: Vickki Hearing, MD;  Location: [facility];  Service: Orthopedics;  Laterality: Left;   VIDEO BRONCHOSCOPY WITH ENDOBRONCHIAL ULTRASOUND N/A 05/04/2017   Procedure: VIDEO BRONCHOSCOPY WITH ENDOBRONCHIAL ULTRASOUND;  Surgeon: Loreli Slot, MD;  Location: [facility] OR;  Service: Thoracic;  Laterality: N/A;   WISDOM TOOTH EXTRACTION       PREVIOUS MEDICATIONS:   CURRENT MEDICATIONS:  Outpatient Encounter Medications as of 08/15/2022  Medication Sig   albuterol (VENTOLIN HFA) 108 (90 Base) MCG/ACT inhaler Inhale 90 mcg into the lungs 4 (four) times daily as needed.   cholecalciferol (VITAMIN D) 1000 units tablet Take 1,000 Units by mouth daily.   docusate sodium (COLACE) 100 MG capsule Take 100 mg by mouth 2 (two) times daily.   donepezil (ARICEPT) 10 MG tablet TAKE 1 TABLET BY MOUTH AT BEDTIME.   doxycycline (VIBRAMYCIN) 100 MG capsule Take 1 capsule (100 mg total) by mouth 2 (two) times daily.   feeding supplement (ENSURE ENLIVE / ENSURE PLUS) LIQD Take 237 mLs by mouth 2 (two) times daily between meals.   gabapentin (NEURONTIN) 100 MG capsule Take 1 capsule (100 mg total) by mouth 3 (three) times daily for 14 days. (Patient not taking: Reported on 06/27/2021)   GOODSENSE ARTIFICIAL TEARS 0.5-0.6 % SOLN Apply to eye.   guaiFENesin (MUCUS RELIEF ADULT PO) Take by mouth.   Lactobacillus-Inulin (CULTURELLE DIGESTIVE DAILY PO) Take by mouth daily.   levothyroxine (SYNTHROID) 125 MCG tablet TAKE (1) TABLET BY MOUTH ONCE DAILY ON AN EMPTY STOMACH.   lidocaine-prilocaine (EMLA) cream Apply 1 application topically as needed.   loperamide (IMODIUM) 2 MG capsule Take by mouth as needed for diarrhea or loose stools.   methylPREDNISolone (MEDROL DOSEPAK) 4 MG TBPK tablet Take as directed   NON FORMULARY  Diet NAS   OXYGEN Inhale 2 L into the lungs continuous.   Psyllium (METAMUCIL PO) Take by mouth. 1 capsule twice daily   rosuvastatin (CRESTOR)  10 MG tablet Take 1 tablet (10 mg total) by mouth daily.   SPIRIVA RESPIMAT 2.5 MCG/ACT AERS Inhale 2 puffs into the lungs daily.   triamcinolone (KENALOG) 0.025 % ointment Apply 1 application topically 2 (two) times daily. Apply to face and arms sparingly for dryness   vitamin B-12 (CYANOCOBALAMIN) 1000 MCG tablet Take 1 tablet (1,000 mcg total) by mouth daily.   No facility-administered encounter medications on file as of 08/15/2022.     Objective:     PHYSICAL EXAMINATION:    VITALS:   Vitals:   08/15/22 0904  BP: 130/80  Pulse: 80  Resp: 20  SpO2: 95%  Weight: 155 lb (70.3 kg)  Height: 5\' 11"  (1.803 m)    GEN:  The patient appears stated age and is in NAD. HEENT:  Normocephalic, atraumatic.   Neurological examination:  General: NAD, well-groomed, appears stated age. Orientation: The patient is alert. Oriented to person, place and not to date Cranial nerves: There is good facial symmetry.The speech is fluent and clear. No aphasia or dysarthria. Fund of knowledge is appropriate. Recent memory impaired and remote memory is normal.  Attention and concentration are normal.  Able to name objects and repeat phrases.  Hearing is intact to conversational tone  .   Delayed recall  2/3 Sensation: Sensation is intact to light touch throughout Motor: Strength is at least antigravity x4. DTR's 2/4 in UE/LE      09/27/2020   12:00 PM  Montreal Cognitive Assessment   Visuospatial/ Executive (0/5) 0  Naming (0/3) 2  Attention: Read list of digits (0/2) 2  Attention: Read list of letters (0/1) 1  Attention: Serial 7 subtraction starting at 100 (0/3) 1  Language: Repeat phrase (0/2) 1  Language : Fluency (0/1) 0  Abstraction (0/2) 0  Delayed Recall (0/5) 0  Orientation (0/6) 4  Total 11  Adjusted Score (based on education) 11        08/15/2022   12:00 PM 02/13/2022    9:00 AM 06/27/2021    3:00 PM  MMSE - Mini Mental State Exam  Orientation to time 3 5 5   Orientation to Place 5 5 5   Registration 3 3 3   Attention/ Calculation 4 5 5   Recall 2 1 1   Language- name 2 objects 2 2 2   Language- repeat 1 1 1   Language- follow 3 step command 3 3 3   Language- read & follow direction 1 1 1   Write a sentence 1 0 1  Copy design 0 0 0  Total score 25 26 27        Movement examination: Tone: There is normal tone in the UE/LE Abnormal movements:  no tremor.  No myoclonus.  No asterixis.   Coordination:  There is no decremation with RAM's. Normal finger to nose  Gait and Station: The patient has difficulty arising out of a deep-seated chair without the use of the hands. The patient's stride length is good.  Gait is cautious and narrow, mild ataxia noted bilaterally without worsening from prior visit.   Thank you for allowing [place] the opportunity to participate in the care of this nice patient. Please do not hesitate to contact us for any questions or concerns.   Total time spent on today's visit was 31 minutes dedicated to this patient today, preparing to see patient, examining the patient, ordering tests and/or medications and counseling the patient, documenting clinical information in the EHR or other health record, independently interpreting results and communicating results to the  patient/family, discussing treatment and goals, answering patient's questions and coordinating care.  Cc:  Assunta Found, MD  Marlowe Kays 08/15/2022 12:25 PM

## 2022-08-15 NOTE — Patient Instructions (Signed)
It was a pleasure to see you today at our office.  ? ?Recommendations: ? ?Follow up in  6 months ?Continue donepezil 10 mg daily. Side effects were discussed  ? ? ?RECOMMENDATIONS FOR ALL PATIENTS WITH MEMORY PROBLEMS: ?1. Continue to exercise (Recommend 30 minutes of walking everyday, or 3 hours every week) ?2. Increase social interactions - continue going to Church and enjoy social gatherings with friends and family ?3. Eat healthy, avoid fried foods and eat more fruits and vegetables ?4. Maintain adequate blood pressure, blood sugar, and blood cholesterol level. Reducing the risk of stroke and cardiovascular disease also helps promoting better memory. ?5. Avoid stressful situations. Live a simple life and avoid aggravations. Organize your time and prepare for the next day in anticipation. ?6. Sleep well, avoid any interruptions of sleep and avoid any distractions in the bedroom that may interfere with adequate sleep quality ?7. Avoid sugar, avoid sweets as there is a strong link between excessive sugar intake, diabetes, and cognitive impairment ?We discussed the Mediterranean diet, which has been shown to help patients reduce the risk of progressive memory disorders and reduces cardiovascular risk. This includes eating fish, eat fruits and green leafy vegetables, nuts like almonds and hazelnuts, walnuts, and also use olive oil. Avoid fast foods and fried foods as much as possible. Avoid sweets and sugar as sugar use has been linked to worsening of memory function. ? ?There is always a concern of gradual progression of memory problems. If this is the case, then we may need to adjust level of care according to patient needs. Support, both to the patient and caregiver, should then be put into place.  ? ? ?FALL PRECAUTIONS: Be cautious when walking. Scan the area for obstacles that may increase the risk of trips and falls. When getting up in the mornings, sit up at the edge of the bed for a few minutes before getting  out of bed. Consider elevating the bed at the head end to avoid drop of blood pressure when getting up. Walk always in a well-lit room (use night lights in the walls). Avoid area rugs or power cords from appliances in the middle of the walkways. Use a walker or a cane if necessary and consider physical therapy for balance exercise. Get your eyesight checked regularly. ? ?FINANCIAL OVERSIGHT: Supervision, especially oversight when making financial decisions or transactions is also recommended. ? ?HOME SAFETY: Consider the safety of the kitchen when operating appliances like stoves, microwave oven, and blender. Consider having supervision and share cooking responsibilities until no longer able to participate in those. Accidents with firearms and other hazards in the house should be identified and addressed as well. ? ? ?ABILITY TO BE LEFT ALONE: If patient is unable to contact 911 operator, consider using LifeLine, or when the need is there, arrange for someone to stay with patients. Smoking is a fire hazard, consider supervision or cessation. Risk of wandering should be assessed by caregiver and if detected at any point, supervision and safe proof recommendations should be instituted. ? ?MEDICATION SUPERVISION: Inability to self-administer medication needs to be constantly addressed. Implement a mechanism to ensure safe administration of the medications. ? ?  ?

## 2022-08-16 DIAGNOSIS — M6281 Muscle weakness (generalized): Secondary | ICD-10-CM | POA: Diagnosis not present

## 2022-08-21 DIAGNOSIS — M6281 Muscle weakness (generalized): Secondary | ICD-10-CM | POA: Diagnosis not present

## 2022-08-23 DIAGNOSIS — M6281 Muscle weakness (generalized): Secondary | ICD-10-CM | POA: Diagnosis not present

## 2022-08-28 DIAGNOSIS — M6281 Muscle weakness (generalized): Secondary | ICD-10-CM | POA: Diagnosis not present

## 2022-09-01 DIAGNOSIS — M6281 Muscle weakness (generalized): Secondary | ICD-10-CM | POA: Diagnosis not present

## 2022-09-04 DIAGNOSIS — M6281 Muscle weakness (generalized): Secondary | ICD-10-CM | POA: Diagnosis not present

## 2022-09-06 DIAGNOSIS — M6281 Muscle weakness (generalized): Secondary | ICD-10-CM | POA: Diagnosis not present

## 2022-09-11 DIAGNOSIS — M6281 Muscle weakness (generalized): Secondary | ICD-10-CM | POA: Diagnosis not present

## 2022-09-13 DIAGNOSIS — M6281 Muscle weakness (generalized): Secondary | ICD-10-CM | POA: Diagnosis not present

## 2022-09-18 DIAGNOSIS — M6281 Muscle weakness (generalized): Secondary | ICD-10-CM | POA: Diagnosis not present

## 2022-09-20 DIAGNOSIS — M6281 Muscle weakness (generalized): Secondary | ICD-10-CM | POA: Diagnosis not present

## 2022-09-25 ENCOUNTER — Ambulatory Visit (HOSPITAL_COMMUNITY)
Admission: RE | Admit: 2022-09-25 | Discharge: 2022-09-25 | Disposition: A | Discharge status: Home / Self Care | Payer: PPO | Source: Ambulatory Visit | Attending: Internal Medicine | Admitting: Internal Medicine

## 2022-09-25 ENCOUNTER — Encounter (HOSPITAL_COMMUNITY): Payer: Self-pay | Admitting: Internal Medicine

## 2022-09-25 ENCOUNTER — Other Ambulatory Visit (HOSPITAL_COMMUNITY): Payer: Self-pay | Admitting: Internal Medicine

## 2022-09-25 DIAGNOSIS — R051 Acute cough: Secondary | ICD-10-CM

## 2022-09-25 DIAGNOSIS — M6281 Muscle weakness (generalized): Secondary | ICD-10-CM | POA: Diagnosis not present

## 2022-09-25 DIAGNOSIS — I10 Essential (primary) hypertension: Secondary | ICD-10-CM | POA: Diagnosis not present

## 2022-09-25 DIAGNOSIS — Z6822 Body mass index (BMI) 22.0-22.9, adult: Secondary | ICD-10-CM | POA: Diagnosis not present

## 2022-09-25 DIAGNOSIS — C3411 Malignant neoplasm of upper lobe, right bronchus or lung: Secondary | ICD-10-CM | POA: Diagnosis not present

## 2022-09-25 DIAGNOSIS — J441 Chronic obstructive pulmonary disease with (acute) exacerbation: Secondary | ICD-10-CM | POA: Diagnosis not present

## 2022-09-25 DIAGNOSIS — R059 Cough, unspecified: Secondary | ICD-10-CM | POA: Diagnosis not present

## 2022-09-25 DIAGNOSIS — Z9981 Dependence on supplemental oxygen: Secondary | ICD-10-CM | POA: Diagnosis not present

## 2022-09-27 DIAGNOSIS — M6281 Muscle weakness (generalized): Secondary | ICD-10-CM | POA: Diagnosis not present

## 2022-09-29 ENCOUNTER — Inpatient Hospital Stay: Payer: PPO | Attending: Hematology

## 2022-09-29 VITALS — BP 122/48 | HR 71 | Temp 97.7°F | Resp 18

## 2022-09-29 DIAGNOSIS — Z87891 Personal history of nicotine dependence: Secondary | ICD-10-CM | POA: Insufficient documentation

## 2022-09-29 DIAGNOSIS — C3411 Malignant neoplasm of upper lobe, right bronchus or lung: Secondary | ICD-10-CM | POA: Insufficient documentation

## 2022-09-29 DIAGNOSIS — Z95828 Presence of other vascular implants and grafts: Secondary | ICD-10-CM

## 2022-09-29 MED ORDER — HEPARIN SOD (PORK) LOCK FLUSH 100 UNIT/ML IV SOLN
500.0000 [IU] | Freq: Once | INTRAVENOUS | Status: AC
  Administered 2022-09-29: 500 [IU] via INTRAVENOUS

## 2022-09-29 MED ORDER — SODIUM CHLORIDE 0.9% FLUSH
10.0000 mL | INTRAVENOUS | Status: DC | PRN
  Administered 2022-09-29: 10 mL via INTRAVENOUS

## 2022-09-29 NOTE — Progress Notes (Signed)
Patients port flushed without difficulty.  Good blood return noted with no bruising or swelling noted at site.  Band aid applied.  VSS with discharge and left in satisfactory condition with no s/s of distress noted.   

## 2022-09-29 NOTE — Patient Instructions (Signed)
[facility]  Discharge Instructions: Thank you for choosing [facility] to provide your oncology and hematology care.  If you have a lab appointment with the Cancer Center - please note that after April 8th, 2024, all labs will be drawn in the cancer center.  You do not have to check in or register with the main entrance as you have in the past but will complete your check-in in the cancer center.  Wear comfortable clothing and clothing appropriate for easy access to any Portacath or PICC line.   We strive to give you quality time with your provider. You may need to reschedule your appointment if you arrive late (15 or more minutes).  Arriving late affects you and other patients whose appointments are after yours.  Also, if you miss three or more appointments without notifying the office, you may be dismissed from the clinic at the provider's discretion.      For prescription refill requests, have your pharmacy contact our office and allow 72 hours for refills to be completed.    Today you received the following : port flush.       To help prevent nausea and vomiting after your treatment, we encourage you to take your nausea medication as directed.  BELOW ARE SYMPTOMS THAT SHOULD BE REPORTED IMMEDIATELY: *FEVER GREATER THAN 100.4 F (38 C) OR HIGHER *CHILLS OR SWEATING *NAUSEA AND VOMITING THAT IS NOT CONTROLLED WITH YOUR NAUSEA MEDICATION *UNUSUAL SHORTNESS OF BREATH *UNUSUAL BRUISING OR BLEEDING *URINARY PROBLEMS (pain or burning when urinating, or frequent urination) *BOWEL PROBLEMS (unusual diarrhea, constipation, pain near the anus) TENDERNESS IN MOUTH AND THROAT WITH OR WITHOUT PRESENCE OF ULCERS (sore throat, sores in mouth, or a toothache) UNUSUAL RASH, SWELLING OR PAIN  UNUSUAL VAGINAL DISCHARGE OR ITCHING   Items with * indicate a potential emergency and should be followed up as soon as possible or go to the Emergency Department if any problems  should occur.  Please show the CHEMOTHERAPY ALERT CARD or IMMUNOTHERAPY ALERT CARD at check-in to the Emergency Department and triage nurse.  Should you have questions after your visit or need to cancel or reschedule your appointment, please contact [facility] AT  [phone]  and follow the prompts.  Office hours are 8:00 a.m. to 4:30 p.m. Monday - Friday. Please note that voicemails left after 4:00 p.m. may not be returned until the following business day.  We are closed weekends and major holidays. You have access to a nurse at all times for urgent questions. Please call the main number to the clinic [phone] and follow the prompts.  For any non-urgent questions, you may also contact your provider using MyChart. We now offer e-Visits for anyone 18 and older to request care online for non-urgent symptoms. For details visit [email or web address].   Also download the MyChart app! Go to the app store, search "MyChart", open the app, select [facility], and log in with your MyChart username and password.   

## 2022-10-04 DIAGNOSIS — M6281 Muscle weakness (generalized): Secondary | ICD-10-CM | POA: Diagnosis not present

## 2022-10-06 DIAGNOSIS — M6281 Muscle weakness (generalized): Secondary | ICD-10-CM | POA: Diagnosis not present

## 2022-10-09 DIAGNOSIS — M6281 Muscle weakness (generalized): Secondary | ICD-10-CM | POA: Diagnosis not present

## 2022-10-11 DIAGNOSIS — M6281 Muscle weakness (generalized): Secondary | ICD-10-CM | POA: Diagnosis not present

## 2022-10-16 DIAGNOSIS — M6281 Muscle weakness (generalized): Secondary | ICD-10-CM | POA: Diagnosis not present

## 2022-10-18 DIAGNOSIS — M6281 Muscle weakness (generalized): Secondary | ICD-10-CM | POA: Diagnosis not present

## 2022-10-23 DIAGNOSIS — M6281 Muscle weakness (generalized): Secondary | ICD-10-CM | POA: Diagnosis not present

## 2022-10-25 DIAGNOSIS — M6281 Muscle weakness (generalized): Secondary | ICD-10-CM | POA: Diagnosis not present

## 2022-10-27 DIAGNOSIS — J441 Chronic obstructive pulmonary disease with (acute) exacerbation: Secondary | ICD-10-CM | POA: Diagnosis not present

## 2022-10-27 DIAGNOSIS — I10 Essential (primary) hypertension: Secondary | ICD-10-CM | POA: Diagnosis not present

## 2022-10-27 DIAGNOSIS — Z9981 Dependence on supplemental oxygen: Secondary | ICD-10-CM | POA: Diagnosis not present

## 2022-11-01 DIAGNOSIS — M6281 Muscle weakness (generalized): Secondary | ICD-10-CM | POA: Diagnosis not present

## 2022-11-03 DIAGNOSIS — M6281 Muscle weakness (generalized): Secondary | ICD-10-CM | POA: Diagnosis not present

## 2022-11-06 DIAGNOSIS — M6281 Muscle weakness (generalized): Secondary | ICD-10-CM | POA: Diagnosis not present

## 2022-11-08 DIAGNOSIS — M6281 Muscle weakness (generalized): Secondary | ICD-10-CM | POA: Diagnosis not present

## 2022-11-09 DIAGNOSIS — M6281 Muscle weakness (generalized): Secondary | ICD-10-CM | POA: Diagnosis not present

## 2022-11-13 DIAGNOSIS — M6281 Muscle weakness (generalized): Secondary | ICD-10-CM | POA: Diagnosis not present

## 2022-11-14 DIAGNOSIS — M6281 Muscle weakness (generalized): Secondary | ICD-10-CM | POA: Diagnosis not present

## 2022-11-16 DIAGNOSIS — M6281 Muscle weakness (generalized): Secondary | ICD-10-CM | POA: Diagnosis not present

## 2022-11-20 DIAGNOSIS — M6281 Muscle weakness (generalized): Secondary | ICD-10-CM | POA: Diagnosis not present

## 2022-11-21 DIAGNOSIS — M6281 Muscle weakness (generalized): Secondary | ICD-10-CM | POA: Diagnosis not present

## 2022-11-22 DIAGNOSIS — M6281 Muscle weakness (generalized): Secondary | ICD-10-CM | POA: Diagnosis not present

## 2022-11-24 DIAGNOSIS — M6281 Muscle weakness (generalized): Secondary | ICD-10-CM | POA: Diagnosis not present

## 2022-11-27 DIAGNOSIS — M6281 Muscle weakness (generalized): Secondary | ICD-10-CM | POA: Diagnosis not present

## 2022-11-28 DIAGNOSIS — M6281 Muscle weakness (generalized): Secondary | ICD-10-CM | POA: Diagnosis not present

## 2022-11-29 DIAGNOSIS — M6281 Muscle weakness (generalized): Secondary | ICD-10-CM | POA: Diagnosis not present

## 2022-11-30 DIAGNOSIS — M6281 Muscle weakness (generalized): Secondary | ICD-10-CM | POA: Diagnosis not present

## 2022-12-05 DIAGNOSIS — M6281 Muscle weakness (generalized): Secondary | ICD-10-CM | POA: Diagnosis not present

## 2022-12-05 DIAGNOSIS — C44319 Basal cell carcinoma of skin of other parts of face: Secondary | ICD-10-CM | POA: Diagnosis not present

## 2022-12-05 DIAGNOSIS — I781 Nevus, non-neoplastic: Secondary | ICD-10-CM | POA: Diagnosis not present

## 2022-12-07 DIAGNOSIS — M6281 Muscle weakness (generalized): Secondary | ICD-10-CM | POA: Diagnosis not present

## 2022-12-12 DIAGNOSIS — M6281 Muscle weakness (generalized): Secondary | ICD-10-CM | POA: Diagnosis not present

## 2022-12-13 DIAGNOSIS — J439 Emphysema, unspecified: Secondary | ICD-10-CM | POA: Diagnosis not present

## 2022-12-13 DIAGNOSIS — Z23 Encounter for immunization: Secondary | ICD-10-CM | POA: Diagnosis not present

## 2022-12-13 DIAGNOSIS — Z6822 Body mass index (BMI) 22.0-22.9, adult: Secondary | ICD-10-CM | POA: Diagnosis not present

## 2022-12-13 DIAGNOSIS — E063 Autoimmune thyroiditis: Secondary | ICD-10-CM | POA: Diagnosis not present

## 2022-12-13 DIAGNOSIS — E782 Mixed hyperlipidemia: Secondary | ICD-10-CM | POA: Diagnosis not present

## 2022-12-13 DIAGNOSIS — E7849 Other hyperlipidemia: Secondary | ICD-10-CM | POA: Diagnosis not present

## 2022-12-13 DIAGNOSIS — Z9981 Dependence on supplemental oxygen: Secondary | ICD-10-CM | POA: Diagnosis not present

## 2022-12-13 DIAGNOSIS — J441 Chronic obstructive pulmonary disease with (acute) exacerbation: Secondary | ICD-10-CM | POA: Diagnosis not present

## 2022-12-13 DIAGNOSIS — E039 Hypothyroidism, unspecified: Secondary | ICD-10-CM | POA: Diagnosis not present

## 2022-12-13 DIAGNOSIS — Z0001 Encounter for general adult medical examination with abnormal findings: Secondary | ICD-10-CM | POA: Diagnosis not present

## 2022-12-13 DIAGNOSIS — I10 Essential (primary) hypertension: Secondary | ICD-10-CM | POA: Diagnosis not present

## 2022-12-13 DIAGNOSIS — Z1331 Encounter for screening for depression: Secondary | ICD-10-CM | POA: Diagnosis not present

## 2022-12-13 DIAGNOSIS — C3411 Malignant neoplasm of upper lobe, right bronchus or lung: Secondary | ICD-10-CM | POA: Diagnosis not present

## 2022-12-14 DIAGNOSIS — M6281 Muscle weakness (generalized): Secondary | ICD-10-CM | POA: Diagnosis not present

## 2022-12-18 DIAGNOSIS — M6281 Muscle weakness (generalized): Secondary | ICD-10-CM | POA: Diagnosis not present

## 2022-12-20 DIAGNOSIS — M6281 Muscle weakness (generalized): Secondary | ICD-10-CM | POA: Diagnosis not present

## 2022-12-26 DIAGNOSIS — M6281 Muscle weakness (generalized): Secondary | ICD-10-CM | POA: Diagnosis not present

## 2022-12-28 DIAGNOSIS — M6281 Muscle weakness (generalized): Secondary | ICD-10-CM | POA: Diagnosis not present

## 2022-12-29 ENCOUNTER — Ambulatory Visit
Admission: RE | Admit: 2022-12-29 | Discharge: 2022-12-29 | Disposition: A | Discharge status: Home / Self Care | Payer: PPO | Source: Ambulatory Visit

## 2022-12-29 VITALS — BP 158/69 | HR 60 | Temp 97.5°F | Resp 20

## 2022-12-29 DIAGNOSIS — L239 Allergic contact dermatitis, unspecified cause: Secondary | ICD-10-CM | POA: Diagnosis not present

## 2022-12-29 DIAGNOSIS — L03211 Cellulitis of face: Secondary | ICD-10-CM | POA: Diagnosis not present

## 2022-12-29 MED ORDER — METHYLPREDNISOLONE SODIUM SUCC 125 MG IJ SOLR
80.0000 mg | Freq: Once | INTRAMUSCULAR | Status: AC
  Administered 2022-12-29: 80 mg via INTRAMUSCULAR

## 2022-12-29 MED ORDER — CEPHALEXIN 500 MG PO CAPS: 500.0000 mg | ORAL_CAPSULE | Freq: Two times a day (BID) | ORAL | 0 refills | Status: DC

## 2022-12-29 NOTE — ED Triage Notes (Signed)
Hx of cyst removal on left side of face on 10/9  Left side of face red and itchy and states his face burns.  Bilateral eyes red.  Symptoms x 1.5 weeks.  Redness more several over the past 2 days.

## 2022-12-29 NOTE — ED Provider Notes (Signed)
[facility]    CSN: [id number] Arrival date & time: 12/29/22  1101      History   Chief Complaint Chief Complaint  Patient presents with   Facial Pain    Entered by patient    HPI Ian Aguilar is a 82 y.o. male.   Patient presenting today with about a week and a half of bilateral facial itching and redness around the eyes and now worsening redness and swelling in the site of a recent surgical procedure to remove a cyst on the left side of face.  Patient is here with his caregiver today who states he picked off the scab from the procedure about 3 days ago and that the area bled quite a bit and is now looking very irritated.  Denies fever, chills, body aches, sweats, throat itching or swelling, new medications or products used.    Past Medical History:  Diagnosis Date   Anxiety    Arthritis    Cancer (HCC)    Cirrhosis (HCC)    confirmed by MRI on 07/04/11.     COPD (chronic obstructive pulmonary disease) (HCC)    Depression with anxiety    ETOH abuse    Hyperlipidemia    Hypertension    diet control   Nodule of upper lobe of right lung    with mediastinal adenopathy   Wears dentures    Wears glasses     Patient Active Problem List   Diagnosis Date Noted   Iron deficiency anemia 02/14/2021   Neurocognitive deficits 04/29/2020   Hyperlipidemia LDL goal <100 04/27/2020   Metacarpal bone fracture 04/27/2020   Aortic atherosclerosis (HCC) 04/27/2020   Chronic constipation 04/27/2020   Severe hypothyroidism 04/22/2020   Distention of biliary tract 04/21/2020   AKI (acute kidney injury) (HCC) 04/21/2020   Hypothyroidism 04/21/2020   Abdominal distension 04/20/2020   Acute encephalopathy 10/23/2019   Dehydration 10/23/2019   COPD with acute exacerbation (HCC) 10/23/2019   Chronic hypoxemic respiratory failure (HCC) 10/23/2019   Hypertensive urgency 10/23/2019   Goals of care, counseling/discussion    Palliative care by specialist    DNR (do not  resuscitate) discussion    Malignant neoplasm of bronchus and lung (HCC)    Carcinoma, lung (HCC) 06/05/2017   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   Hepatic cirrhosis (HCC) 04/06/2017   History of colonic polyps 04/06/2017   History of snoring 09/07/2015   Fracture of ulnar styloid    Benign essential HTN    Faintness    Thrombocytopenia (HCC) 09/03/2015   Hyponatremia 09/03/2015   Closed displaced intertrochanteric fracture of right femur (HCC)    Distal radius fracture, right 09/02/2015   Fracture of right hip requiring operative repair (HCC) 09/01/2015   Alcohol abuse 09/01/2015   Tobacco abuse 09/01/2015   COPD (chronic obstructive pulmonary disease) 09/01/2015   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   Weakness of left leg 01/10/2012   Difficulty walking 01/10/2012   Pain in left hip 01/10/2012   Acute blood loss anemia 12/21/2011   Cirrhosis- imaging diagnosis 2013 09/08/2011   Umbilical hernia 09/08/2011   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   Hepatomegaly 06/14/2011   Encounter for screening colonoscopy 06/14/2011   JOINT EFFUSION, KNEE 02/10/2008   KNEE PAIN 02/10/2008    Past Surgical History:  Procedure Laterality Date   bilateral cataract surgery     Marrowbone   broken arm  6 yrs ago   left otif of wrist-[place]  CLOSED REDUCTION WRIST FRACTURE Right 09/02/2015   Procedure: RIGHT WRIST REDUCTION;  Surgeon: Sheral Apley, MD;  Location: [facility] OR;  Service: Orthopedics;  Laterality: Right;  with MAC   COLONOSCOPY  1996   [facility]: external hemorrhoids, no polyps   COLONOSCOPY  06/28/2011   Dr. Claudius Sis diverticulosis,tubular adenoma, hyperplastic polyp   COLONOSCOPY WITH PROPOFOL N/A 04/26/2017   Dr. Jena Gauss: perianal and digital rectal examinations normal, diffusely congested colonic mucosa but otherwise normal   ESOPHAGOGASTRODUODENOSCOPY  06/28/2011   Dr. Dellie Catholic erosive reflux esophagitis, hiatal hernia-gastritis    ESOPHAGOGASTRODUODENOSCOPY (EGD) WITH PROPOFOL N/A 04/26/2017   Dr. Jena Gauss: normal esophagus, small hiatal hernia, GAVE, portal hypertensive gastropathy, normal duodenal bulb and second portion, no specimens collected. 2 years screening    ESOPHAGOGASTRODUODENOSCOPY (EGD) WITH PROPOFOL N/A 06/19/2019   normal esophagus, portal gastropathy, GAVE, normal duodenum. 2 years screening.    EXTERNAL EAR SURGERY     right ear-cleaned out ear and created new eardrum   INGUINAL HERNIA REPAIR  2-3 yrs ago   left-[place]   INTRAMEDULLARY (IM) NAIL INTERTROCHANTERIC Right 09/02/2015   Procedure: RIGHT  INTERTROCHANTRIC HIP;  Surgeon: Sheral Apley, MD;  Location: [facility] OR;  Service: Orthopedics;  Laterality: Right;  With MAC   KNEE SURGERY     left-arthroscopy-[place]   PORTACATH PLACEMENT Left 06/08/2017   Procedure: INSERTION POWER PORT WITH  ATTACHED CATHETER LEFT SUBCLAVIAN;  Surgeon: Franky Macho, MD;  Location: [facility]S;  Service: General;  Laterality: Left;   SKULL FRACTURE ELEVATION     fractured cheeck bone repaired   TOTAL HIP ARTHROPLASTY  12/18/2011   Procedure: TOTAL HIP ARTHROPLASTY;  Surgeon: Vickki Hearing, MD;  Location: [facility];  Service: Orthopedics;  Laterality: Left;   VIDEO BRONCHOSCOPY WITH ENDOBRONCHIAL ULTRASOUND N/A 05/04/2017   Procedure: VIDEO BRONCHOSCOPY WITH ENDOBRONCHIAL ULTRASOUND;  Surgeon: Loreli Slot, MD;  Location: [facility] OR;  Service: Thoracic;  Laterality: N/A;   WISDOM TOOTH EXTRACTION         Home Medications    Prior to Admission medications   Medication Sig Start Date End Date Taking? Authorizing Provider  cephALEXin (KEFLEX) 500 MG capsule Take 1 capsule (500 mg total) by mouth 2 (two) times daily. 12/29/22  Yes Particia Nearing, PA-C  ferrous sulfate 325 (65 FE) MG EC tablet Take 325 mg by mouth daily.   Yes [provider]  albuterol (VENTOLIN HFA) 108 (90 Base) MCG/ACT inhaler Inhale 90 mcg into the lungs 4 (four) times daily as  needed. 06/16/21   [provider]  benzonatate (TESSALON) 200 MG capsule Take 200 mg by mouth 2 (two) times daily as needed for cough. 08/17/22   [provider]  cholecalciferol (VITAMIN D) 1000 units tablet Take 1,000 Units by mouth daily.    [provider]  cycloSPORINE (RESTASIS) 0.05 % ophthalmic emulsion Place 1 drop into both eyes 2 (two) times daily. 08/18/22   [provider]  docusate sodium (COLACE) 100 MG capsule Take 100 mg by mouth 2 (two) times daily.    [provider]  donepezil (ARICEPT) 10 MG tablet TAKE 1 TABLET BY MOUTH AT BEDTIME. 09/01/21   Gwynneth Munson, Sung Amabile, PA-C  feeding supplement (ENSURE ENLIVE / ENSURE PLUS) LIQD Take 237 mLs by mouth 2 (two) times daily between meals. 04/27/20   [provider]  GOODSENSE ARTIFICIAL TEARS 0.5-0.6 % SOLN Apply to eye. 05/24/20   [provider]  Lactobacillus-Inulin (CULTURELLE DIGESTIVE DAILY PO) Take by mouth daily.  [provider]  levothyroxine (SYNTHROID) 125 MCG tablet TAKE (1) TABLET BY MOUTH ONCE DAILY ON AN EMPTY STOMACH. 05/09/21   Doreatha Massed, MD  lidocaine-prilocaine (EMLA) cream Apply 1 application topically as needed. 04/20/21   Doreatha Massed, MD  loperamide (IMODIUM) 2 MG capsule Take by mouth as needed for diarrhea or loose stools.    [provider]  Clent Demark Diet NAS 04/23/20   [provider]  OXYGEN Inhale 2 L into the lungs continuous. 04/24/20   [provider]  Psyllium (METAMUCIL PO) Take by mouth. 1 capsule twice daily    [provider]  rosuvastatin (CRESTOR) 10 MG tablet Take 1 tablet (10 mg total) by mouth daily. 05/21/20   Sharee Holster, NP  SPIRIVA RESPIMAT 2.5 MCG/ACT AERS Inhale 2 puffs into the lungs daily. 05/31/20   [provider]  Dwyane Luo 200-62.5-25 MCG/ACT AEPB Inhale 1 puff into the lungs daily. 09/15/22   [provider]  triamcinolone (KENALOG) 0.025 %  ointment Apply 1 application topically 2 (two) times daily. Apply to face and arms sparingly for dryness 05/24/20   [provider]  vitamin B-12 (CYANOCOBALAMIN) 1000 MCG tablet Take 1 tablet (1,000 mcg total) by mouth daily. 02/10/21   Doreatha Massed, MD    Family History Family History  Problem Relation Age of Onset   Stroke Mother 22   Heart disease Father 20       MI   Diabetes Maternal Uncle    Colon cancer Neg Hx    Cancer Neg Hx     Social History Social History   Tobacco Use   Smoking status: Former    Current packs/day: 0.00    Types: Cigarettes    Quit date: 02/27/1957    Years since quitting: 65.8   Smokeless tobacco: Never   Tobacco comments:    1/2 ppd > 60 years as of 06/27/17: 4-5 cigarettes or less a day  Vaping Use   Vaping status: Never Used  Substance Use Topics   Alcohol use: Not Currently    Alcohol/week: 2.0 standard drinks of alcohol    Types: 2 Cans of beer per week   Drug use: No     Allergies   Patient has no known allergies.   Review of Systems Review of Systems HPI  Physical Exam Triage Vital Signs ED Triage Vitals  Encounter Vitals Group     BP 12/29/22 1116 (!) 158/69     Systolic BP Percentile --      Diastolic BP Percentile --      Pulse Rate 12/29/22 1116 60     Resp 12/29/22 1116 20     Temp 12/29/22 1116 (!) 97.5 F (36.4 C)     Temp Source 12/29/22 1116 Oral     SpO2 12/29/22 1116 92 %     Weight --      Height --      Head Circumference --      Peak Flow --      Pain Score 12/29/22 1118 0     Pain Loc --      Pain Education --      Exclude from Growth Chart --    No data found.  Updated Vital Signs BP (!) 158/69   Pulse 60   Temp (!) 97.5 F (36.4 C) (Oral)   Resp 20   SpO2 92%   Visual Acuity Right Eye Distance:   Left Eye Distance:   Bilateral Distance:  Right Eye Near:   Left Eye Near:    Bilateral Near:     Physical Exam Vitals and nursing note reviewed.  Constitutional:       Appearance: Normal appearance.  HENT:     Head: Atraumatic.  Eyes:     Extraocular Movements: Extraocular movements intact.     Conjunctiva/sclera: Conjunctivae normal.  Cardiovascular:     Rate and Rhythm: Normal rate.  Pulmonary:     Effort: Pulmonary effort is normal.     Breath sounds: No wheezing.  Musculoskeletal:     Cervical back: Normal range of motion and neck supple.     Comments: Range of motion at baseline  Skin:    General: Skin is warm.     Findings: Erythema present.     Comments: Circular scabbed ulceration to left side of face from recent surgical procedure, surrounding erythema and edema but no active drainage or bleeding.  Diffuse erythema, edema to bilateral eyelids extending down toward face  Neurological:     General: No focal deficit present.     Mental Status: He is oriented to person, place, and time.  Psychiatric:        Mood and Affect: Mood normal.        Thought Content: Thought content normal.        Judgment: Judgment normal.      UC Treatments / Results  Labs (all labs ordered are listed, but only abnormal results are displayed) Labs Reviewed - No data to display  EKG   Radiology No results found.  Procedures Procedures (including critical care time)  Medications Ordered in UC Medications  methylPREDNISolone sodium succinate (SOLU-MEDROL) 125 mg/2 mL injection 80 mg (80 mg Intramuscular Given 12/29/22 1143)    Initial Impression / Assessment and Plan / UC Course  I have reviewed the triage vital signs and the nursing notes.  Pertinent labs & imaging results that were available during my care of the patient were reviewed by me and considered in my medical decision making (see chart for details).     Will treat with Keflex for suspected cellulitis of the surgical site and IM Solu-Medrol for possible allergic dermatitis.  Unclear trigger at this time.  Avoid any scented or irritating products, new medications at this time.  Follow-up  with primary care for a recheck.  Final Clinical Impressions(s) / UC Diagnoses   Final diagnoses:  Facial cellulitis  Allergic dermatitis   Discharge Instructions   None    ED Prescriptions     Medication Sig Dispense Auth. Provider   cephALEXin (KEFLEX) 500 MG capsule Take 1 capsule (500 mg total) by mouth 2 (two) times daily. 14 capsule Particia Nearing, [place]      PDMP not reviewed this encounter.   Particia Nearing, [place] 12/29/22 1419

## 2023-01-02 DIAGNOSIS — M6281 Muscle weakness (generalized): Secondary | ICD-10-CM | POA: Diagnosis not present

## 2023-01-03 DIAGNOSIS — M6281 Muscle weakness (generalized): Secondary | ICD-10-CM | POA: Diagnosis not present

## 2023-01-08 ENCOUNTER — Ambulatory Visit (HOSPITAL_COMMUNITY)
Admission: RE | Admit: 2023-01-08 | Discharge: 2023-01-08 | Disposition: A | Discharge status: Home / Self Care | Payer: PPO | Source: Ambulatory Visit | Attending: Hematology | Admitting: Hematology

## 2023-01-08 ENCOUNTER — Inpatient Hospital Stay: Payer: PPO | Attending: Hematology

## 2023-01-08 VITALS — BP 125/54 | HR 53 | Temp 96.7°F | Resp 18

## 2023-01-08 DIAGNOSIS — C3490 Malignant neoplasm of unspecified part of unspecified bronchus or lung: Secondary | ICD-10-CM | POA: Diagnosis not present

## 2023-01-08 DIAGNOSIS — I700 Atherosclerosis of aorta: Secondary | ICD-10-CM | POA: Diagnosis not present

## 2023-01-08 DIAGNOSIS — C3491 Malignant neoplasm of unspecified part of right bronchus or lung: Secondary | ICD-10-CM | POA: Insufficient documentation

## 2023-01-08 DIAGNOSIS — C3411 Malignant neoplasm of upper lobe, right bronchus or lung: Secondary | ICD-10-CM | POA: Diagnosis not present

## 2023-01-08 DIAGNOSIS — E039 Hypothyroidism, unspecified: Secondary | ICD-10-CM | POA: Insufficient documentation

## 2023-01-08 DIAGNOSIS — Z87891 Personal history of nicotine dependence: Secondary | ICD-10-CM | POA: Diagnosis not present

## 2023-01-08 DIAGNOSIS — M6281 Muscle weakness (generalized): Secondary | ICD-10-CM | POA: Diagnosis not present

## 2023-01-08 LAB — CBC WITH DIFFERENTIAL/PLATELET
Abs Immature Granulocytes: 0.01 10*3/uL (ref 0.00–0.07)
Basophils Absolute: 0 10*3/uL (ref 0.0–0.1)
Basophils Relative: 0 %
Eosinophils Absolute: 0.1 10*3/uL (ref 0.0–0.5)
Eosinophils Relative: 3 %
HCT: 38.5 % — ABNORMAL LOW (ref 39.0–52.0)
Hemoglobin: 12.6 g/dL — ABNORMAL LOW (ref 13.0–17.0)
Immature Granulocytes: 0 %
Lymphocytes Relative: 14 %
Lymphs Abs: 0.7 10*3/uL (ref 0.7–4.0)
MCH: 32.7 pg (ref 26.0–34.0)
MCHC: 32.7 g/dL (ref 30.0–36.0)
MCV: 100 fL (ref 80.0–100.0)
Monocytes Absolute: 0.4 10*3/uL (ref 0.1–1.0)
Monocytes Relative: 7 %
Neutro Abs: 3.8 10*3/uL (ref 1.7–7.7)
Neutrophils Relative %: 76 %
Platelets: 178 10*3/uL (ref 150–400)
RBC: 3.85 MIL/uL — ABNORMAL LOW (ref 4.22–5.81)
RDW: 14.3 % (ref 11.5–15.5)
WBC: 5 10*3/uL (ref 4.0–10.5)
nRBC: 0 % (ref 0.0–0.2)

## 2023-01-08 LAB — IRON AND TIBC
Iron: 104 ug/dL (ref 45–182)
Saturation Ratios: 42 % — ABNORMAL HIGH (ref 17.9–39.5)
TIBC: 249 ug/dL — ABNORMAL LOW (ref 250–450)
UIBC: 145 ug/dL

## 2023-01-08 LAB — COMPREHENSIVE METABOLIC PANEL
ALT: 18 U/L (ref 0–44)
AST: 25 U/L (ref 15–41)
Albumin: 3.9 g/dL (ref 3.5–5.0)
Alkaline Phosphatase: 73 U/L (ref 38–126)
Anion gap: 8 (ref 5–15)
BUN: 12 mg/dL (ref 8–23)
CO2: 28 mmol/L (ref 22–32)
Calcium: 8.5 mg/dL — ABNORMAL LOW (ref 8.9–10.3)
Chloride: 101 mmol/L (ref 98–111)
Creatinine, Ser: 1.13 mg/dL (ref 0.61–1.24)
GFR, Estimated: >60 mL/min (ref >60)
Glucose, Bld: 129 mg/dL — ABNORMAL HIGH (ref 70–99)
Potassium: 4.2 mmol/L (ref 3.5–5.1)
Sodium: 137 mmol/L (ref 135–145)
Total Bilirubin: 0.7 mg/dL (ref <1.2)
Total Protein: 6.8 g/dL (ref 6.5–8.1)

## 2023-01-08 LAB — TSH: TSH: 5.51 u[IU]/mL — ABNORMAL HIGH (ref 0.350–4.500)

## 2023-01-08 LAB — FERRITIN: Ferritin: 58 ng/mL (ref 24–336)

## 2023-01-08 MED ORDER — HEPARIN SOD (PORK) LOCK FLUSH 100 UNIT/ML IV SOLN
500.0000 [IU] | Freq: Once | INTRAVENOUS | Status: AC
  Administered 2023-01-08: 500 [IU] via INTRAVENOUS

## 2023-01-08 MED ORDER — SODIUM CHLORIDE 0.9% FLUSH
10.0000 mL | Freq: Once | INTRAVENOUS | Status: AC
  Administered 2023-01-08: 10 mL via INTRAVENOUS

## 2023-01-08 NOTE — Progress Notes (Signed)
Patients port flushed without difficulty.  Good blood return noted with no bruising or swelling noted at site.  Band aid applied.  VSS with discharge and left in satisfactory condition with no s/s of distress noted.   

## 2023-01-08 NOTE — Patient Instructions (Signed)
 [facility] - A DEPT OF [facility]  Discharge Instructions: Thank you for choosing [facility] to provide your oncology and hematology care.  If you have a lab appointment with the Cancer Center - please note that after April 8th, 2024, all labs will be drawn in the cancer center.  You do not have to check in or register with the main entrance as you have in the past but will complete your check-in in the cancer center.  Wear comfortable clothing and clothing appropriate for easy access to any Portacath or PICC line.   We strive to give you quality time with your provider. You may need to reschedule your appointment if you arrive late (15 or more minutes).  Arriving late affects you and other patients whose appointments are after yours.  Also, if you miss three or more appointments without notifying the office, you may be dismissed from the clinic at the provider's discretion.      For prescription refill requests, have your pharmacy contact our office and allow 72 hours for refills to be completed.    Today you received the following chemotherapy and/or immunotherapy agents port flush labs      To help prevent nausea and vomiting after your treatment, we encourage you to take your nausea medication as directed.  BELOW ARE SYMPTOMS THAT SHOULD BE REPORTED IMMEDIATELY: *FEVER GREATER THAN 100.4 F (38 C) OR HIGHER *CHILLS OR SWEATING *NAUSEA AND VOMITING THAT IS NOT CONTROLLED WITH YOUR NAUSEA MEDICATION *UNUSUAL SHORTNESS OF BREATH *UNUSUAL BRUISING OR BLEEDING *URINARY PROBLEMS (pain or burning when urinating, or frequent urination) *BOWEL PROBLEMS (unusual diarrhea, constipation, pain near the anus) TENDERNESS IN MOUTH AND THROAT WITH OR WITHOUT PRESENCE OF ULCERS (sore throat, sores in mouth, or a toothache) UNUSUAL RASH, SWELLING OR PAIN  UNUSUAL VAGINAL DISCHARGE OR ITCHING   Items with * indicate a potential emergency and should be  followed up as soon as possible or go to the Emergency Department if any problems should occur.  Please show the CHEMOTHERAPY ALERT CARD or IMMUNOTHERAPY ALERT CARD at check-in to the Emergency Department and triage nurse.  Should you have questions after your visit or need to cancel or reschedule your appointment, please contact [facility] - A DEPT OF [facility] [phone]  and follow the prompts.  Office hours are 8:00 a.m. to 4:30 p.m. Monday - Friday. Please note that voicemails left after 4:00 p.m. may not be returned until the following business day.  We are closed weekends and major holidays. You have access to a nurse at all times for urgent questions. Please call the main number to the clinic [phone] and follow the prompts.  For any non-urgent questions, you may also contact your provider using MyChart. We now offer e-Visits for anyone 29 and older to request care online for non-urgent symptoms. For details visit [email or web address].   Also download the MyChart app! Go to the app store, search "MyChart", open the app, select [facility], and log in with your MyChart username and password.

## 2023-01-09 DIAGNOSIS — E039 Hypothyroidism, unspecified: Secondary | ICD-10-CM | POA: Diagnosis not present

## 2023-01-09 DIAGNOSIS — C4491 Basal cell carcinoma of skin, unspecified: Secondary | ICD-10-CM | POA: Diagnosis not present

## 2023-01-09 DIAGNOSIS — Z6822 Body mass index (BMI) 22.0-22.9, adult: Secondary | ICD-10-CM | POA: Diagnosis not present

## 2023-01-10 DIAGNOSIS — M6281 Muscle weakness (generalized): Secondary | ICD-10-CM | POA: Diagnosis not present

## 2023-01-15 ENCOUNTER — Inpatient Hospital Stay: Payer: PPO | Admitting: Hematology

## 2023-01-16 DIAGNOSIS — M6281 Muscle weakness (generalized): Secondary | ICD-10-CM | POA: Diagnosis not present

## 2023-01-16 DIAGNOSIS — Z08 Encounter for follow-up examination after completed treatment for malignant neoplasm: Secondary | ICD-10-CM | POA: Diagnosis not present

## 2023-01-16 DIAGNOSIS — Z85828 Personal history of other malignant neoplasm of skin: Secondary | ICD-10-CM | POA: Diagnosis not present

## 2023-01-16 DIAGNOSIS — L218 Other seborrheic dermatitis: Secondary | ICD-10-CM | POA: Diagnosis not present

## 2023-01-17 DIAGNOSIS — M6281 Muscle weakness (generalized): Secondary | ICD-10-CM | POA: Diagnosis not present

## 2023-01-22 DIAGNOSIS — M6281 Muscle weakness (generalized): Secondary | ICD-10-CM | POA: Diagnosis not present

## 2023-01-29 DIAGNOSIS — M6281 Muscle weakness (generalized): Secondary | ICD-10-CM | POA: Diagnosis not present

## 2023-01-30 ENCOUNTER — Inpatient Hospital Stay: Payer: PPO | Admitting: Hematology

## 2023-01-30 NOTE — Progress Notes (Incomplete)
[facility] [address] [place] [address]    Clinic Day:  01/30/2023  Referring physician: Assunta Found, MD  Patient Care Team: Assunta Found, MD as PCP - General (Family Medicine) Jena Gauss Gerrit Friends, MD (Gastroenterology) Gwynneth Munson Corrie Dandy (Neurology)   ASSESSMENT & PLAN:   Assessment: 1.  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 -Durvalumab consolidation from 09/19/2017 through 09/17/2018. -Denies any cough or hemoptysis. -I reviewed CT chest with contrast from 06/16/2019 which showed 9 mm nodule in the medial right upper lobe unchanged.  Adjacent radiation changes.  No evidence of progression or metastatic disease. -CT chest on 12/16/2019 showed stable radiation changes and small nodule in the medial right upper lobe with no signs of recurrence.  New bandlike area of septal thickening and micro nodularity in the right lateral and left anterior upper chest, most suggestive of sequela of infection/inflammation.  Signs of hepatic cirrhosis.    Plan: 1.  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 assisted living facility after stroke. - Reviewed CT chest from 06/29/2022: Radiation changes in the medial right upper lobe with no evidence of recurrence or metastatic disease. - Labs today: Normal LFTs and creatinine.  CBC was grossly normal. - Recommend follow-up in 6 months with repeat CT chest without contrast.   2.  Hypothyroidism: - He is currently on Synthroid 100 mcg daily. - Recommend follow-up TSH with his PMD.  3.  Normocytic anemia: - Hemoglobin today is 12.2.  Ferritin is 32 and percent saturation 27.  He has received Feraheme in the past. - Recommend iron tablet daily.  Will check ferritin and iron panel next visit.    No orders of the defined types were placed in this encounter.     Alben Deeds  Teague,acting as a Neurosurgeon for Doreatha Massed, MD.,have documented all relevant documentation on the behalf of Doreatha Massed, MD,as directed by  Doreatha Massed, MD while in the presence of Doreatha Massed, MD.  ***   Maybee R Teague   12/3/20247:39 AM  CHIEF COMPLAINT:   Diagnosis: stage II right upper lobe adenocarcinoma    Cancer Staging  No matching staging information was found for the patient.    Prior Therapy: 1. Chemoradiation with carboplatin and paclitaxel weekly from 06/11/2017 to 07/16/2017. 2. Consolidation with durvalumab from 09/19/2017 to 09/17/2018.  Current Therapy:  surveillance   HISTORY OF PRESENT ILLNESS:   Oncology History  Carcinoma, lung (HCC)  06/05/2017 Initial Diagnosis   Carcinoma, lung (HCC)   06/11/2017 - 07/16/2017 Chemotherapy   The patient had dexamethasone (DECADRON) 4 MG tablet, 8 mg, Oral, Daily, 1 of 1 cycle, Start date: --, End date: -- palonosetron (ALOXI) injection 0.25 mg, 0.25 mg, Intravenous,  Once, 6 of 6 cycles Administration: 0.25 mg (06/11/2017), 0.25 mg (07/09/2017), 0.25 mg (07/16/2017), 0.25 mg (06/18/2017), 0.25 mg (06/25/2017), 0.25 mg (07/02/2017) CARBOplatin (PARAPLATIN) 190 mg in sodium chloride 0.9 % 250 mL chemo infusion, 190 mg (100 % of original dose 192.8 mg), Intravenous,  Once, 6 of 6 cycles Dose modification:   (original dose 192.8 mg, Cycle 1),   (original dose 192.8 mg, Cycle 5), 144.6 mg (original dose 144.6 mg, Cycle 6),   (original dose 192.8 mg, Cycle 2),   (original dose 192.8 mg, Cycle 3),   (original dose 192.8 mg, Cycle 4) Administration: 190 mg (06/11/2017), 190 mg (07/09/2017), 140 mg (07/16/2017), 190  mg (06/18/2017), 190 mg (06/25/2017), 190 mg (07/02/2017) PACLitaxel (TAXOL) 90 mg in dextrose 5 % 250 mL chemo infusion (</= 80mg /m2), 45 mg/m2 = 90 mg, Intravenous,  Once, 6 of 6 cycles Dose modification: 40.5 mg/m2 (90 % of original dose 45 mg/m2, Cycle 5, Reason: Other (see comments), Comment: early  neuropathy), 36 mg/m2 (80 % of original dose 45 mg/m2, Cycle 6, Reason: Provider Judgment) Administration: 90 mg (06/11/2017), 78 mg (07/09/2017), 72 mg (07/16/2017), 90 mg (06/18/2017), 78 mg (06/25/2017), 78 mg (07/02/2017)  for chemotherapy treatment.    Malignant neoplasm of bronchus and lung (HCC)  06/08/2017 Initial Diagnosis   Malignant neoplasm of bronchus and lung (HCC)   09/20/2017 - 09/17/2018 Chemotherapy   Patient is on Treatment Plan : LUNG DURVALUMAB Q14D        INTERVAL HISTORY:   Ian Aguilar is a 82 y.o. male presenting to clinic today for follow up of stage II right upper lobe adenocarcinoma. He was last seen by me on 07/06/22.  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  Of note, he was also seen in the ED on 12/29/22 for facial cellulitis and possible allergic dermatitis, treated with Keflex and Solu-Medrol.   Today, he states that he is doing well overall. His appetite level is at ***%. His energy level is at ***%.  PAST MEDICAL HISTORY:   Past Medical History: Past Medical History:  Diagnosis Date   Anxiety    Arthritis    Cancer (HCC)    Cirrhosis (HCC)    confirmed by MRI on 07/04/11.     COPD (chronic obstructive pulmonary disease) (HCC)    Depression with anxiety    ETOH abuse    Hyperlipidemia    Hypertension    diet control   Nodule of upper lobe of right lung    with mediastinal adenopathy   Wears dentures    Wears glasses     Surgical  History: Past Surgical History:  Procedure Laterality Date   bilateral cataract surgery     [place]   broken arm  6 yrs ago   left otif of wrist-[place]   CLOSED REDUCTION WRIST FRACTURE Right 09/02/2015   Procedure: RIGHT WRIST REDUCTION;  Surgeon: Sheral Apley, MD;  Location: [facility] OR;  Service: Orthopedics;  Laterality: Right;  with MAC   COLONOSCOPY  1996   [place]: external hemorrhoids, no polyps   COLONOSCOPY  06/28/2011   Dr. Claudius Sis diverticulosis,tubular adenoma, hyperplastic polyp   COLONOSCOPY WITH PROPOFOL N/A 04/26/2017   Dr. Jena Gauss: perianal and digital rectal examinations normal, diffusely congested colonic mucosa but otherwise normal   ESOPHAGOGASTRODUODENOSCOPY  06/28/2011   Dr. Dellie Catholic erosive reflux esophagitis, hiatal hernia-gastritis   ESOPHAGOGASTRODUODENOSCOPY (EGD) WITH PROPOFOL N/A 04/26/2017   Dr. Jena Gauss: normal esophagus, small hiatal hernia, GAVE, portal hypertensive gastropathy, normal duodenal bulb and second portion, no specimens collected. 2 years screening    ESOPHAGOGASTRODUODENOSCOPY (EGD) WITH PROPOFOL N/A 06/19/2019   normal esophagus, portal gastropathy, GAVE, normal duodenum. 2 years screening.    EXTERNAL EAR SURGERY     right ear-cleaned out ear and  created new eardrum   INGUINAL HERNIA REPAIR  2-3 yrs ago   left-[place]   INTRAMEDULLARY (IM) NAIL INTERTROCHANTERIC Right 09/02/2015   Procedure: RIGHT  INTERTROCHANTRIC HIP;  Surgeon: Sheral Apley, MD;  Location: [facility] OR;  Service: Orthopedics;  Laterality: Right;  With MAC   KNEE SURGERY     left-arthroscopy-[place]   PORTACATH PLACEMENT Left 06/08/2017   Procedure: INSERTION POWER PORT WITH  ATTACHED CATHETER LEFT SUBCLAVIAN;  Surgeon: Franky Macho, MD;  Location: [facility] ORS;  Service: General;  Laterality: Left;   SKULL FRACTURE ELEVATION     fractured cheeck bone repaired   TOTAL HIP ARTHROPLASTY  12/18/2011   Procedure: TOTAL HIP ARTHROPLASTY;  Surgeon: Vickki Hearing, MD;   Location: [facility] ORS;  Service: Orthopedics;  Laterality: Left;   VIDEO BRONCHOSCOPY WITH ENDOBRONCHIAL ULTRASOUND N/A 05/04/2017   Procedure: VIDEO BRONCHOSCOPY WITH ENDOBRONCHIAL ULTRASOUND;  Surgeon: Loreli Slot, MD;  Location: [facility] OR;  Service: Thoracic;  Laterality: N/A;   WISDOM TOOTH EXTRACTION      Social History: Social History   Socioeconomic History   Marital status: Widowed    Spouse name: Not on file   Number of children: 2   Years of education: 16   Highest education level: Not on file  Occupational History   Occupation: retired    Associate Professor: [employer]  Tobacco Use   Smoking status: Former    Current packs/day: 0.00    Types: Cigarettes    Quit date: 02/27/1957    Years since quitting: 65.9   Smokeless tobacco: Never   Tobacco comments:    1/2 ppd > 60 years as of 06/27/17: 4-5 cigarettes or less a day  Vaping Use   Vaping status: Never Used  Substance and Sexual Activity   Alcohol use: Not Currently    Alcohol/week: 2.0 standard drinks of alcohol    Types: 2 Cans of beer per week   Drug use: No   Sexual activity: Yes    Birth control/protection: None  Other Topics Concern   Not on file  Social History Narrative   Right handed   Drinks caffeine   Assistant living   Retired   Has one son and daughter    Social Determinants of Corporate investment banker Strain: Not on file  Food Insecurity: Not on file  Transportation Needs: Not on file  Physical Activity: Not on file  Stress: Not on file  Social Connections: Not on file  Intimate Partner Violence: Not on file    Family History: Family History  Problem Relation Age of Onset   Stroke Mother 44   Heart disease Father 36       MI   Diabetes Maternal Uncle    Colon cancer Neg Hx    Cancer Neg Hx     Current Medications:  Current Outpatient Medications:    albuterol (VENTOLIN HFA) 108 (90 Base) MCG/ACT inhaler, Inhale 90 mcg into the lungs 4 (four) times daily as needed.,  Disp: , Rfl:    benzonatate (TESSALON) 200 MG capsule, Take 200 mg by mouth 2 (two) times daily as needed for cough., Disp: , Rfl:    cephALEXin (KEFLEX) 500 MG capsule, Take 1 capsule (500 mg total) by mouth 2 (two) times daily., Disp: 14 capsule, Rfl: 0   cholecalciferol (VITAMIN D) 1000 units tablet, Take 1,000 Units by mouth daily., Disp: , Rfl:    cycloSPORINE (RESTASIS) 0.05 % ophthalmic emulsion, Place 1 drop into both eyes 2 (  two) times daily., Disp: , Rfl:    docusate sodium (COLACE) 100 MG capsule, Take 100 mg by mouth 2 (two) times daily., Disp: , Rfl:    donepezil (ARICEPT) 10 MG tablet, TAKE 1 TABLET BY MOUTH AT BEDTIME., Disp: 30 tablet, Rfl: 4   feeding supplement (ENSURE ENLIVE / ENSURE PLUS) LIQD, Take 237 mLs by mouth 2 (two) times daily between meals., Disp: , Rfl:    ferrous sulfate 325 (65 FE) MG EC tablet, Take 325 mg by mouth daily., Disp: , Rfl:    GOODSENSE ARTIFICIAL TEARS 0.5-0.6 % SOLN, Apply to eye., Disp: , Rfl:    Lactobacillus-Inulin (CULTURELLE DIGESTIVE DAILY PO), Take by mouth daily., Disp: , Rfl:    levothyroxine (SYNTHROID) 125 MCG tablet, TAKE (1) TABLET BY MOUTH ONCE DAILY ON AN EMPTY STOMACH., Disp: 30 tablet, Rfl: 5   lidocaine-prilocaine (EMLA) cream, Apply 1 application topically as needed., Disp: 30 g, Rfl: 3   loperamide (IMODIUM) 2 MG capsule, Take by mouth as needed for diarrhea or loose stools., Disp: , Rfl:    NON FORMULARY, Diet NAS, Disp: , Rfl:    OXYGEN, Inhale 2 L into the lungs continuous., Disp: , Rfl:    Psyllium (METAMUCIL PO), Take by mouth. 1 capsule twice daily, Disp: , Rfl:    rosuvastatin (CRESTOR) 10 MG tablet, Take 1 tablet (10 mg total) by mouth daily., Disp: 30 tablet, Rfl: 0   SPIRIVA RESPIMAT 2.5 MCG/ACT AERS, Inhale 2 puffs into the lungs daily., Disp: , Rfl:    TRELEGY ELLIPTA 200-62.5-25 MCG/ACT AEPB, Inhale 1 puff into the lungs daily., Disp: , Rfl:    triamcinolone (KENALOG) 0.025 % ointment, Apply 1 application topically 2  (two) times daily. Apply to face and arms sparingly for dryness, Disp: , Rfl:    vitamin B-12 (CYANOCOBALAMIN) 1000 MCG tablet, Take 1 tablet (1,000 mcg total) by mouth daily., Disp: 30 tablet, Rfl: 6   Allergies: No Known Allergies  REVIEW OF SYSTEMS:   Review of Systems  Constitutional:  Negative for chills, fatigue and fever.  HENT:   Negative for lump/mass, mouth sores, nosebleeds, sore throat and trouble swallowing.   Eyes:  Negative for eye problems.  Respiratory:  Negative for cough and shortness of breath.   Cardiovascular:  Negative for chest pain, leg swelling and palpitations.  Gastrointestinal:  Negative for abdominal pain, constipation, diarrhea, nausea and vomiting.  Genitourinary:  Negative for bladder incontinence, difficulty urinating, dysuria, frequency, hematuria and nocturia.   Musculoskeletal:  Negative for arthralgias, back pain, flank pain, myalgias and neck pain.  Skin:  Negative for itching and rash.  Neurological:  Negative for dizziness, headaches and numbness.  Hematological:  Does not bruise/bleed easily.  Psychiatric/Behavioral:  Negative for depression, sleep disturbance and suicidal ideas. The patient is not nervous/anxious.   All other systems reviewed and are negative.    VITALS:   There were no vitals taken for this visit.  Wt Readings from Last 3 Encounters:  08/15/22 155 lb (70.3 kg)  07/06/22 150 lb 9.6 oz (68.3 kg)  01/10/22 153 lb 12.8 oz (69.8 kg)    There is no height or weight on file to calculate BMI.  Performance status (ECOG): 1 - Symptomatic but completely ambulatory  PHYSICAL EXAM:   Physical Exam Vitals and nursing note reviewed. Exam conducted with a chaperone present.  Constitutional:      Appearance: Normal appearance.  Cardiovascular:     Rate and Rhythm: Normal rate and regular rhythm.  Pulses: Normal pulses.     Heart sounds: Normal heart sounds.  Pulmonary:     Effort: Pulmonary effort is normal.     Breath  sounds: Normal breath sounds.  Abdominal:     Palpations: Abdomen is soft. There is no hepatomegaly, splenomegaly or mass.     Tenderness: There is no abdominal tenderness.  Musculoskeletal:     Right lower leg: No edema.     Left lower leg: No edema.  Lymphadenopathy:     Cervical: No cervical adenopathy.     Right cervical: No superficial, deep or posterior cervical adenopathy.    Left cervical: No superficial, deep or posterior cervical adenopathy.     Upper Body:     Right upper body: No supraclavicular or axillary adenopathy.     Left upper body: No supraclavicular or axillary adenopathy.  Neurological:     General: No focal deficit present.     Mental Status: He is alert and oriented to person, place, and time.  Psychiatric:        Mood and Affect: Mood normal.        Behavior: Behavior normal.     LABS:      Latest Ref Rng & Units 01/08/2023    9:52 AM 06/29/2022    1:48 PM 12/22/2021   12:15 PM  CBC  WBC 4.0 - 10.5 K/uL 5.0  4.3  4.1   Hemoglobin 13.0 - 17.0 g/dL 14.7  82.9  56.2   Hematocrit 39.0 - 52.0 % 38.5  38.3  36.2   Platelets 150 - 400 K/uL 178  157  167       Latest Ref Rng & Units 01/08/2023    9:52 AM 06/29/2022    1:48 PM 12/22/2021    1:01 PM  CMP  Glucose 70 - 99 mg/dL 130  865    BUN 8 - 23 mg/dL 12  18    Creatinine 7.84 - 1.24 mg/dL 6.96  2.95  2.84   Sodium 135 - 145 mmol/L 137  133    Potassium 3.5 - 5.1 mmol/L 4.2  3.9    Chloride 98 - 111 mmol/L 101  95    CO2 22 - 32 mmol/L 28  29    Calcium 8.9 - 10.3 mg/dL 8.5  8.7    Total Protein 6.5 - 8.1 g/dL 6.8  6.8    Total Bilirubin <1.2 mg/dL 0.7  0.9    Alkaline Phos 38 - 126 U/L 73  65    AST 15 - 41 U/L 25  19    ALT 0 - 44 U/L 18  11       No results found for: "CEA1", "CEA" / No results found for: "CEA1", "CEA" No results found for: "PSA1" No results found for: "CAN199" No results found for: "CAN125"  Lab Results  Component Value Date   TOTALPROTELP 6.6 01/03/2021   ALBUMINELP  3.7 01/03/2021   A1GS 0.3 01/03/2021   A2GS 0.8 01/03/2021   BETS 1.1 01/03/2021   GAMS 0.7 01/03/2021   MSPIKE Not Observed 01/03/2021   SPEI Comment 01/03/2021   Lab Results  Component Value Date   TIBC 249 (L) 01/08/2023   TIBC 273 06/29/2022   TIBC 251 06/16/2021   FERRITIN 58 01/08/2023   FERRITIN 32 06/29/2022   FERRITIN 205 06/16/2021   IRONPCTSAT 42 (H) 01/08/2023   IRONPCTSAT 27 06/29/2022   IRONPCTSAT 18 06/16/2021   Lab Results  Component Value Date   LDH  134 01/03/2021   LDH 136 06/11/2017   LDH 152 05/22/2017     STUDIES:   CT Chest Wo Contrast  Result Date: 01/26/2023 CLINICAL DATA:  Non-small cell lung cancer.  * Tracking Code: BO * EXAM: CT CHEST WITHOUT CONTRAST TECHNIQUE: Multidetector CT imaging of the chest was performed following the standard protocol without IV contrast. RADIATION DOSE REDUCTION: This exam was performed according to the departmental dose-optimization program which includes automated exposure control, adjustment of the mA and/or kV according to patient size and/or use of iterative reconstruction technique. COMPARISON:  06/29/2022, 12/22/2021 and 12/30/2020 and MR abdomen 04/21/2020. FINDINGS: Cardiovascular: Left-sided Port-A-Cath terminates in the SVC. Marked narrowing of the left brachiocephalic vein with less severe narrowing of the superior vena cava. Atherosclerotic calcification of the aorta, aortic valve and coronary arteries. Ascending aorta measures up to 3.9 cm (4/67). Enlarged right and left pulmonary arteries. Heart is at the upper limits of normal in size to mildly enlarged. No pericardial effusion. Mediastinum/Nodes: No pathologically enlarged mediastinal or axillary lymph nodes. Hilar regions are difficult to definitively evaluate without IV contrast. Esophagus is grossly unremarkable. Lungs/Pleura: Centrilobular emphysema. Parenchymal coarsening, mild bronchiectasis and subpleural consolidation in the medial right upper lobe,  unchanged from 12/30/2020, presumably treatment related. Dominant ground-glass nodule in the medial right lower lobe measures 2.7 x 2.9 cm (3/85), unchanged from 06/29/2022 when remeasured in a similar fashion. There is new associated mild subpleural consolidation posteriorly (3/86). A ground-glass and cystic nodule in the adjacent right lower lobe measures 1.3 x 1.3 cm (3/94), stable. No solid component. Minimal subsegmental volume loss in the lingula. Mild scarring in the left lower lobe. No pleural fluid. Minimal debris in the airway. Upper Abdomen: Liver margin is markedly irregular. Partially imaged 1.4 cm mildly heterogeneous low-attenuation lesion in the left hepatic lobe (2/172), described as chronic intrahepatic biliary ductal dilatation/ductal ectasia on MR abdomen 04/21/2020. Left hemidiaphragm is elevated. Visualized portions of the liver, adrenal glands, kidneys, spleen, pancreas, stomach and bowel are otherwise grossly unremarkable. No upper abdominal adenopathy. Musculoskeletal: Degenerative changes in the spine. Old rib fractures. No worrisome lytic or sclerotic lesions. Chronic L1 compression fracture. Osteopenia. IMPRESSION: 1. Dominant ground-glass nodule in the right lower lobe, stable in size but with a new adjacent subpleural nodular component, indeterminate and possibly infectious/inflammatory in etiology. Recommend short-term follow-up CT chest without contrast in 4-6 weeks from today scan in further evaluation, as adenocarcinoma cannot be definitively excluded. These results will be called to the ordering clinician or representative by the Radiologist Assistant, and communication documented in the PACS or [employer]. 2. Smaller ground-glass and cystic nodule in the adjacent right lower lobe, stable. Recommend continued attention on follow-up. 3. Post treatment scarring in the medial right upper lobe. 4. Cirrhosis. 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 5. Aortic atherosclerosis (ICD10-I70.0). Coronary artery calcification. 6. Enlarged right and left pulmonary arteries, indicative of pulmonary arterial hypertension. 7.  Emphysema (ICD10-J43.9). Electronically Signed   By: Leanna Battles M.D.   On: 01/26/2023 11:00

## 2023-01-30 NOTE — Progress Notes (Signed)
[facility] [address] [place] [address]    Clinic Day:  01/31/2023  Referring physician: Assunta Found, MD  Patient Care Team: Assunta Found, MD as PCP - General (Family Medicine) Jena Gauss Gerrit Friends, MD (Gastroenterology) Gwynneth Munson Corrie Dandy (Neurology)   ASSESSMENT & PLAN:   Assessment: 1.  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 -Durvalumab consolidation from 09/19/2017 through 09/17/2018. -Denies any cough or hemoptysis. -I reviewed CT chest with contrast from 06/16/2019 which showed 9 mm nodule in the medial right upper lobe unchanged.  Adjacent radiation changes.  No evidence of progression or metastatic disease. -CT chest on 12/16/2019 showed stable radiation changes and small nodule in the medial right upper lobe with no signs of recurrence.  New bandlike area of septal thickening and micro nodularity in the right lateral and left anterior upper chest, most suggestive of sequela of infection/inflammation.  Signs of hepatic cirrhosis.    Plan: 1.  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 assisted living facility after stroke. - Denies any respiratory infections in the past 6 months. - Reviewed labs from 01/08/2023: Normal LFTs and creatinine.  CBC grossly normal. - CT chest on 01/08/2023: Stable dominant groundglass nodule in the medial right lower lobe.  New mild subpleural consolidation posteriorly in the right lung.  Posttreatment scarring in the medial right upper lobe.  Other benign findings were discussed. - I think the new mild subpleural consolidation is inflammatory. - I have recommended follow-up with CT chest in 6 months with contrast.   2.  Hypothyroidism: - TSH is 5.5.  Continue Synthroid 100 mcg daily.  Will repeat TSH at next visit.  3.  Normocytic anemia: - Ferritin is 58 and percent  saturation is 42.  Hemoglobin is 12.6. - Continue iron tablet daily.  Will repeat iron labs next time.    Orders Placed This Encounter  Procedures   CT CHEST W CONTRAST    Standing Status:   Future    Standing Expiration Date:   01/31/2024    Order Specific Question:   If indicated for the ordered procedure, I authorize the administration of contrast media per Radiology protocol    Answer:   Yes    Order Specific Question:   Does the patient have a contrast media/X-ray dye allergy?    Answer:   No    Order Specific Question:   Preferred imaging location?    Answer:   [facility]      I,Katie Daubenspeck,acting as a scribe for Doreatha Massed, MD.,have documented all relevant documentation on the behalf of Doreatha Massed, MD,as directed by  Doreatha Massed, MD while in the presence of Doreatha Massed, MD.   I, Doreatha Massed MD, have reviewed the above documentation for accuracy and completeness, and I agree with the above.   Doreatha Massed, MD   12/4/202410:27 AM  CHIEF COMPLAINT:   Diagnosis: stage II right upper lobe adenocarcinoma    Cancer Staging  No matching staging information was found for the patient.    Prior Therapy: 1. Chemoradiation with carboplatin and paclitaxel weekly from 06/11/2017 to 07/16/2017. 2. Consolidation with durvalumab from 09/19/2017 to 09/17/2018.  Current Therapy:  surveillance    HISTORY OF PRESENT ILLNESS:   Oncology History  Carcinoma, lung (HCC)  06/05/2017 Initial Diagnosis   Carcinoma, lung (HCC)   06/11/2017 - 07/16/2017 Chemotherapy  The patient had dexamethasone (DECADRON) 4 MG tablet, 8 mg, Oral, Daily, 1 of 1 cycle, Start date: --, End date: -- palonosetron (ALOXI) injection 0.25 mg, 0.25 mg, Intravenous,  Once, 6 of 6 cycles Administration: 0.25 mg (06/11/2017), 0.25 mg (07/09/2017), 0.25 mg (07/16/2017), 0.25 mg (06/18/2017), 0.25 mg (06/25/2017), 0.25 mg (07/02/2017) CARBOplatin (PARAPLATIN) 190  mg in sodium chloride 0.9 % 250 mL chemo infusion, 190 mg (100 % of original dose 192.8 mg), Intravenous,  Once, 6 of 6 cycles Dose modification:   (original dose 192.8 mg, Cycle 1),   (original dose 192.8 mg, Cycle 5), 144.6 mg (original dose 144.6 mg, Cycle 6),   (original dose 192.8 mg, Cycle 2),   (original dose 192.8 mg, Cycle 3),   (original dose 192.8 mg, Cycle 4) Administration: 190 mg (06/11/2017), 190 mg (07/09/2017), 140 mg (07/16/2017), 190 mg (06/18/2017), 190 mg (06/25/2017), 190 mg (07/02/2017) PACLitaxel (TAXOL) 90 mg in dextrose 5 % 250 mL chemo infusion (</= 80mg /m2), 45 mg/m2 = 90 mg, Intravenous,  Once, 6 of 6 cycles Dose modification: 40.5 mg/m2 (90 % of original dose 45 mg/m2, Cycle 5, Reason: Other (see comments), Comment: early neuropathy), 36 mg/m2 (80 % of original dose 45 mg/m2, Cycle 6, Reason: Provider Judgment) Administration: 90 mg (06/11/2017), 78 mg (07/09/2017), 72 mg (07/16/2017), 90 mg (06/18/2017), 78 mg (06/25/2017), 78 mg (07/02/2017)  for chemotherapy treatment.    Malignant neoplasm of bronchus and lung (HCC)  06/08/2017 Initial Diagnosis   Malignant neoplasm of bronchus and lung (HCC)   09/20/2017 - 09/17/2018 Chemotherapy   Patient is on Treatment Plan : LUNG DURVALUMAB Q14D        INTERVAL HISTORY:   Ian Aguilar is a 82 y.o. male presenting to clinic today for follow up of stage II right upper lobe adenocarcinoma. He was last seen by me on 07/06/22.  Since his last visit, he underwent surveillance chest CT on 01/08/23 showing: indeterminate dominant ground-glass nodule in RLL, stable in size but with new adjacent subpleural nodular component; stable smaller ground-glass and cystic nodule in adjacent RLL.  Today, he states that he is doing well overall. His appetite level is at 100%. His energy level is at 75%.  PAST MEDICAL HISTORY:   Past Medical History: Past Medical History:  Diagnosis Date   Anxiety    Arthritis    Cancer (HCC)    Cirrhosis (HCC)    confirmed  by MRI on 07/04/11.     COPD (chronic obstructive pulmonary disease) (HCC)    Depression with anxiety    ETOH abuse    Hyperlipidemia    Hypertension    diet control   Nodule of upper lobe of right lung    with mediastinal adenopathy   Wears dentures    Wears glasses     Surgical History: Past Surgical History:  Procedure Laterality Date   bilateral cataract surgery     [place]   broken arm  6 yrs ago   left otif of wrist-[place]   CLOSED REDUCTION WRIST FRACTURE Right 09/02/2015   Procedure: RIGHT WRIST REDUCTION;  Surgeon: Sheral Apley, MD;  Location: [facility] OR;  Service: Orthopedics;  Laterality: Right;  with MAC   COLONOSCOPY  1996   [place]: external hemorrhoids, no polyps   COLONOSCOPY  06/28/2011   Dr. Claudius Sis diverticulosis,tubular adenoma, hyperplastic polyp   COLONOSCOPY WITH PROPOFOL N/A 04/26/2017   Dr. Jena Gauss: perianal and digital rectal examinations normal, diffusely congested colonic mucosa but otherwise normal   ESOPHAGOGASTRODUODENOSCOPY  06/28/2011  Dr. Dellie Catholic erosive reflux esophagitis, hiatal hernia-gastritis   ESOPHAGOGASTRODUODENOSCOPY (EGD) WITH PROPOFOL N/A 04/26/2017   Dr. Jena Gauss: normal esophagus, small hiatal hernia, GAVE, portal hypertensive gastropathy, normal duodenal bulb and second portion, no specimens collected. 2 years screening    ESOPHAGOGASTRODUODENOSCOPY (EGD) WITH PROPOFOL N/A 06/19/2019   normal esophagus, portal gastropathy, GAVE, normal duodenum. 2 years screening.    EXTERNAL EAR SURGERY     right ear-cleaned out ear and created new eardrum   INGUINAL HERNIA REPAIR  2-3 yrs ago   left-[place]   INTRAMEDULLARY (IM) NAIL INTERTROCHANTERIC Right 09/02/2015   Procedure: RIGHT  INTERTROCHANTRIC HIP;  Surgeon: Sheral Apley, MD;  Location: [facility] OR;  Service: Orthopedics;  Laterality: Right;  With MAC   KNEE SURGERY     left-arthroscopy-[place]   PORTACATH PLACEMENT Left 06/08/2017   Procedure: INSERTION POWER PORT WITH  ATTACHED  CATHETER LEFT SUBCLAVIAN;  Surgeon: Franky Macho, MD;  Location: [facility] ORS;  Service: General;  Laterality: Left;   SKULL FRACTURE ELEVATION     fractured cheeck bone repaired   TOTAL HIP ARTHROPLASTY  12/18/2011   Procedure: TOTAL HIP ARTHROPLASTY;  Surgeon: Vickki Hearing, MD;  Location: [facility] OR[facility];  Service: Orthopedics;  Laterality: Left;   VIDEO BRONCHOSCOPY WITH ENDOBRONCHIAL ULTRASOUND N/A 05/04/2017   Procedure: VIDEO BRONCHOSCOPY WITH ENDOBRONCHIAL ULTRASOUND;  Surgeon: Loreli Slot, MD;  Location: [facility];  Service: Thoracic;  Laterality: N/A;   WISDOM TOOTH EXTRACTION      Social History: Social History   Socioeconomic History   Marital status: Widowed    Spouse name: Not on file   Number of children: 2   Years of education: 16   Highest education level: Not on file  Occupational History   Occupation: retired    Associate Professor: [employer]  Tobacco Use   Smoking status: Former    Current packs/day: 0.00    Types: Cigarettes    Quit date: 02/27/1957    Years since quitting: 65.9   Smokeless tobacco: Never   Tobacco comments:    1/2 ppd > 60 years as of 06/27/17: 4-5 cigarettes or less a day  Vaping Use   Vaping status: Never Used  Substance and Sexual Activity   Alcohol use: Not Currently    Alcohol/week: 2.0 standard drinks of alcohol    Types: 2 Cans of beer per week   Drug use: No   Sexual activity: Yes    Birth control/protection: None  Other Topics Concern   Not on file  Social History Narrative   Right handed   Drinks caffeine   Assistant living   Retired   Has one son and daughter    Social Determinants of Corporate investment banker Strain: Not on file  Food Insecurity: Not on file  Transportation Needs: Not on file  Physical Activity: Not on file  Stress: Not on file  Social Connections: Not on file  Intimate Partner Violence: Not on file    Family History: Family History  Problem Relation Age of Onset   Stroke Mother 41   Heart  disease Father 43       MI   Diabetes Maternal Uncle    Colon cancer Neg Hx    Cancer Neg Hx     Current Medications:  Current Outpatient Medications:    acetaminophen (TYLENOL) 325 MG tablet, Take by mouth., Disp: , Rfl:    albuterol (VENTOLIN HFA) 108 (90 Base) MCG/ACT inhaler, Inhale 90 mcg into the lungs 4 (  four) times daily as needed., Disp: , Rfl:    Aloe Vera GEL, Apply topically., Disp: , Rfl:    benzonatate (TESSALON) 200 MG capsule, Take 200 mg by mouth 2 (two) times daily as needed for cough., Disp: , Rfl:    cholecalciferol (VITAMIN D) 1000 units tablet, Take 1,000 Units by mouth daily., Disp: , Rfl:    cycloSPORINE (RESTASIS) 0.05 % ophthalmic emulsion, Place 1 drop into both eyes 2 (two) times daily., Disp: , Rfl:    desonide (DESOWEN) 0.05 % cream, Apply topically., Disp: , Rfl:    docusate sodium (COLACE) 100 MG capsule, Take 100 mg by mouth 2 (two) times daily., Disp: , Rfl:    donepezil (ARICEPT) 10 MG tablet, TAKE 1 TABLET BY MOUTH AT BEDTIME., Disp: 30 tablet, Rfl: 4   feeding supplement (ENSURE ENLIVE / ENSURE PLUS) LIQD, Take 237 mLs by mouth 2 (two) times daily between meals., Disp: , Rfl:    ferrous sulfate 325 (65 FE) MG EC tablet, Take 325 mg by mouth daily., Disp: , Rfl:    GOODSENSE ARTIFICIAL TEARS 0.5-0.6 % SOLN, Apply to eye., Disp: , Rfl:    HYDROcodone bit-homatropine (HYCODAN) 5-1.5 MG/5ML syrup, Take by mouth., Disp: , Rfl:    Lactobacillus-Inulin (CULTURELLE DIGESTIVE DAILY PO), Take by mouth daily., Disp: , Rfl:    levothyroxine (SYNTHROID) 125 MCG tablet, TAKE (1) TABLET BY MOUTH ONCE DAILY ON AN EMPTY STOMACH., Disp: 30 tablet, Rfl: 5   loperamide (IMODIUM) 2 MG capsule, Take by mouth as needed for diarrhea or loose stools., Disp: , Rfl:    melatonin 5 MG TABS, Take 5 mg by mouth at bedtime., Disp: , Rfl:    mupirocin ointment (BACTROBAN) 2 %, 1 Application 2 (two) times daily., Disp: , Rfl:    NON FORMULARY, Diet NAS, Disp: , Rfl:    OXYGEN, Inhale  2 L into the lungs continuous., Disp: , Rfl:    Psyllium (METAMUCIL PO), Take by mouth. 1 capsule twice daily, Disp: , Rfl:    rosuvastatin (CRESTOR) 10 MG tablet, Take 1 tablet (10 mg total) by mouth daily., Disp: 30 tablet, Rfl: 0   TRELEGY ELLIPTA 200-62.5-25 MCG/ACT AEPB, Inhale 1 puff into the lungs daily., Disp: , Rfl:    vitamin B-12 (CYANOCOBALAMIN) 1000 MCG tablet, Take 1 tablet (1,000 mcg total) by mouth daily., Disp: 30 tablet, Rfl: 6   lidocaine-prilocaine (EMLA) cream, Apply 1 application topically as needed. (Patient not taking: Reported on 01/31/2023), Disp: 30 g, Rfl: 3   SPIRIVA RESPIMAT 2.5 MCG/ACT AERS, Inhale 2 puffs into the lungs daily. (Patient not taking: Reported on 01/31/2023), Disp: , Rfl:    triamcinolone (KENALOG) 0.025 % ointment, Apply 1 application topically 2 (two) times daily. Apply to face and arms sparingly for dryness (Patient not taking: Reported on 01/31/2023), Disp: , Rfl:    Allergies: No Known Allergies  REVIEW OF SYSTEMS:   Review of Systems  Constitutional:  Negative for chills, fatigue and fever.  HENT:   Negative for lump/mass, mouth sores, nosebleeds, sore throat and trouble swallowing.   Eyes:  Negative for eye problems.  Respiratory:  Positive for cough. Negative for shortness of breath.   Cardiovascular:  Negative for chest pain, leg swelling and palpitations.  Gastrointestinal:  Negative for abdominal pain, constipation, diarrhea, nausea and vomiting.  Genitourinary:  Negative for bladder incontinence, difficulty urinating, dysuria, frequency, hematuria and nocturia.   Musculoskeletal:  Negative for arthralgias, back pain, flank pain, myalgias and neck pain.  Skin:  Negative for itching and rash.  Neurological:  Negative for dizziness, headaches and numbness.  Hematological:  Does not bruise/bleed easily.  Psychiatric/Behavioral:  Negative for depression, sleep disturbance and suicidal ideas. The patient is not nervous/anxious.   All other  systems reviewed and are negative.    VITALS:   Blood pressure (!) 158/67, pulse (!) 58, temperature 97.7 F (36.5 C), temperature source Oral, resp. rate 16, SpO2 96%.  Wt Readings from Last 3 Encounters:  08/15/22 155 lb (70.3 kg)  07/06/22 150 lb 9.6 oz (68.3 kg)  01/10/22 153 lb 12.8 oz (69.8 kg)    There is no height or weight on file to calculate BMI.  Performance status (ECOG): 1 - Symptomatic but completely ambulatory  PHYSICAL EXAM:   Physical Exam Vitals and nursing note reviewed. Exam conducted with a chaperone present.  Constitutional:      Appearance: Normal appearance.  Cardiovascular:     Rate and Rhythm: Normal rate and regular rhythm.     Pulses: Normal pulses.     Heart sounds: Normal heart sounds.  Pulmonary:     Effort: Pulmonary effort is normal.     Breath sounds: Normal breath sounds.  Abdominal:     Palpations: Abdomen is soft. There is no hepatomegaly, splenomegaly or mass.     Tenderness: There is no abdominal tenderness.  Musculoskeletal:     Right lower leg: No edema.     Left lower leg: No edema.  Lymphadenopathy:     Cervical: No cervical adenopathy.     Right cervical: No superficial, deep or posterior cervical adenopathy.    Left cervical: No superficial, deep or posterior cervical adenopathy.     Upper Body:     Right upper body: No supraclavicular or axillary adenopathy.     Left upper body: No supraclavicular or axillary adenopathy.  Neurological:     General: No focal deficit present.     Mental Status: He is alert and oriented to person, place, and time.  Psychiatric:        Mood and Affect: Mood normal.        Behavior: Behavior normal.     LABS:      Latest Ref Rng & Units 01/08/2023    9:52 AM 06/29/2022    1:48 PM 12/22/2021   12:15 PM  CBC  WBC 4.0 - 10.5 K/uL 5.0  4.3  4.1   Hemoglobin 13.0 - 17.0 g/dL 32.4  40.1  02.7   Hematocrit 39.0 - 52.0 % 38.5  38.3  36.2   Platelets 150 - 400 K/uL 178  157  167        Latest Ref Rng & Units 01/08/2023    9:52 AM 06/29/2022    1:48 PM 12/22/2021    1:01 PM  CMP  Glucose 70 - 99 mg/dL 253  664    BUN 8 - 23 mg/dL 12  18    Creatinine 4.03 - 1.24 mg/dL 4.74  2.59  5.63   Sodium 135 - 145 mmol/L 137  133    Potassium 3.5 - 5.1 mmol/L 4.2  3.9    Chloride 98 - 111 mmol/L 101  95    CO2 22 - 32 mmol/L 28  29    Calcium 8.9 - 10.3 mg/dL 8.5  8.7    Total Protein 6.5 - 8.1 g/dL 6.8  6.8    Total Bilirubin <1.2 mg/dL 0.7  0.9    Alkaline Phos 38 - 126 U/L 73  65    AST 15 - 41 U/L 25  19    ALT 0 - 44 U/L 18  11       No results found for: "CEA1", "CEA" / No results found for: "CEA1", "CEA" No results found for: "PSA1" No results found for: "CAN199" No results found for: "CAN125"  Lab Results  Component Value Date   TOTALPROTELP 6.6 01/03/2021   ALBUMINELP 3.7 01/03/2021   A1GS 0.3 01/03/2021   A2GS 0.8 01/03/2021   BETS 1.1 01/03/2021   GAMS 0.7 01/03/2021   MSPIKE Not Observed 01/03/2021   SPEI Comment 01/03/2021   Lab Results  Component Value Date   TIBC 249 (L) 01/08/2023   TIBC 273 06/29/2022   TIBC 251 06/16/2021   FERRITIN 58 01/08/2023   FERRITIN 32 06/29/2022   FERRITIN 205 06/16/2021   IRONPCTSAT 42 (H) 01/08/2023   IRONPCTSAT 27 06/29/2022   IRONPCTSAT 18 06/16/2021   Lab Results  Component Value Date   LDH 134 01/03/2021   LDH 136 06/11/2017   LDH 152 05/22/2017     STUDIES:   CT Chest Wo Contrast  Result Date: 01/26/2023 CLINICAL DATA:  Non-small cell lung cancer.  * Tracking Code: BO * EXAM: CT CHEST WITHOUT CONTRAST TECHNIQUE: Multidetector CT imaging of the chest was performed following the standard protocol without IV contrast. RADIATION DOSE REDUCTION: This exam was performed according to the departmental dose-optimization program which includes automated exposure control, adjustment of the mA and/or kV according to patient size and/or use of iterative reconstruction technique. COMPARISON:  06/29/2022, 12/22/2021  and 12/30/2020 and MR abdomen 04/21/2020. FINDINGS: Cardiovascular: Left-sided Port-A-Cath terminates in the SVC. Marked narrowing of the left brachiocephalic vein with less severe narrowing of the superior vena cava. Atherosclerotic calcification of the aorta, aortic valve and coronary arteries. Ascending aorta measures up to 3.9 cm (4/67). Enlarged right and left pulmonary arteries. Heart is at the upper limits of normal in size to mildly enlarged. No pericardial effusion. Mediastinum/Nodes: No pathologically enlarged mediastinal or axillary lymph nodes. Hilar regions are difficult to definitively evaluate without IV contrast. Esophagus is grossly unremarkable. Lungs/Pleura: Centrilobular emphysema. Parenchymal coarsening, mild bronchiectasis and subpleural consolidation in the medial right upper lobe, unchanged from 12/30/2020, presumably treatment related. Dominant ground-glass nodule in the medial right lower lobe measures 2.7 x 2.9 cm (3/85), unchanged from 06/29/2022 when remeasured in a similar fashion. There is new associated mild subpleural consolidation posteriorly (3/86). A ground-glass and cystic nodule in the adjacent right lower lobe measures 1.3 x 1.3 cm (3/94), stable. No solid component. Minimal subsegmental volume loss in the lingula. Mild scarring in the left lower lobe. No pleural fluid. Minimal debris in the airway. Upper Abdomen: Liver margin is markedly irregular. Partially imaged 1.4 cm mildly heterogeneous low-attenuation lesion in the left hepatic lobe (2/172), described as chronic intrahepatic biliary ductal dilatation/ductal ectasia on MR abdomen 04/21/2020. Left hemidiaphragm is elevated. Visualized portions of the liver, adrenal glands, kidneys, spleen, pancreas, stomach and bowel are otherwise grossly unremarkable. No upper abdominal adenopathy. Musculoskeletal: Degenerative changes in the spine. Old rib fractures. No worrisome lytic or sclerotic lesions. Chronic L1 compression  fracture. Osteopenia. IMPRESSION: 1. Dominant ground-glass nodule in the right lower lobe, stable in size but with a new adjacent subpleural nodular component, indeterminate and possibly infectious/inflammatory in etiology. Recommend short-term follow-up CT chest without contrast in 4-6 weeks from today scan in further evaluation, as adenocarcinoma cannot be definitively excluded. These results will be called to the ordering clinician or  representative by the Radiologist Assistant, and communication documented in the PACS or [employer]. 2. Smaller ground-glass and cystic nodule in the adjacent right lower lobe, stable. Recommend continued attention on follow-up. 3. Post treatment scarring in the medial right upper lobe. 4. Cirrhosis. 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 5. Aortic atherosclerosis (ICD10-I70.0). Coronary artery calcification. 6. Enlarged right and left pulmonary arteries, indicative of pulmonary arterial hypertension. 7.  Emphysema (ICD10-J43.9). Electronically Signed   By: Leanna Battles M.D.   On: 01/26/2023 11:00

## 2023-01-31 ENCOUNTER — Inpatient Hospital Stay: Payer: PPO | Attending: Hematology | Admitting: Hematology

## 2023-01-31 VITALS — BP 158/67 | HR 58 | Temp 97.7°F | Resp 16

## 2023-01-31 DIAGNOSIS — Z87891 Personal history of nicotine dependence: Secondary | ICD-10-CM | POA: Diagnosis not present

## 2023-01-31 DIAGNOSIS — Z79899 Other long term (current) drug therapy: Secondary | ICD-10-CM | POA: Insufficient documentation

## 2023-01-31 DIAGNOSIS — E039 Hypothyroidism, unspecified: Secondary | ICD-10-CM | POA: Insufficient documentation

## 2023-01-31 DIAGNOSIS — D649 Anemia, unspecified: Secondary | ICD-10-CM | POA: Diagnosis not present

## 2023-01-31 DIAGNOSIS — M8580 Other specified disorders of bone density and structure, unspecified site: Secondary | ICD-10-CM | POA: Diagnosis not present

## 2023-01-31 DIAGNOSIS — C3491 Malignant neoplasm of unspecified part of right bronchus or lung: Secondary | ICD-10-CM | POA: Diagnosis not present

## 2023-01-31 DIAGNOSIS — C3411 Malignant neoplasm of upper lobe, right bronchus or lung: Secondary | ICD-10-CM | POA: Insufficient documentation

## 2023-01-31 DIAGNOSIS — M6281 Muscle weakness (generalized): Secondary | ICD-10-CM | POA: Diagnosis not present

## 2023-01-31 NOTE — Patient Instructions (Signed)
[facility] at [facility] Discharge Instructions   You were seen and examined today by Dr. Ellin Saba.  He reviewed the results of your lab work which are normal/stable.  He reviewed the results of your CT scan which is mostly normal/stable. There is a new area in the lung, but Dr. Kirtland Bouchard believes this to be inflammation versus cancer.   We will see you back in 6 months. We will repeat lab work and a CT scan prior to this visit.  Return as scheduled.    Thank you for choosing [facility] to provide your oncology and hematology care.  To afford each patient quality time with our provider, please arrive at least 15 minutes before your scheduled appointment time.   If you have a lab appointment with the Cancer Center please come in thru the Main Entrance and check in at the main information desk.  You need to re-schedule your appointment should you arrive 10 or more minutes late.  We strive to give you quality time with our providers, and arriving late affects you and other patients whose appointments are after yours.  Also, if you no show three or more times for appointments you may be dismissed from the clinic at the providers discretion.     Again, thank you for choosing [facility].  Our hope is that these requests will decrease the amount of time that you wait before being seen by our physicians.       _____________________________________________________________  Should you have questions after your visit to [facility], please contact our office at [phone] and follow the prompts.  Our office hours are 8:00 a.m. and 4:30 p.m. Monday - Friday.  Please note that voicemails left after 4:00 p.m. may not be returned until the following business day.  We are closed weekends and major holidays.  You do have access to a nurse 24-7, just call the main number to the clinic [phone] and do not press any  options, hold on the line and a nurse will answer the phone.    For prescription refill requests, have your pharmacy contact our office and allow 72 hours.    Due to Covid, you will need to wear a mask upon entering the hospital. If you do not have a mask, a mask will be given to you at the Main Entrance upon arrival. For doctor visits, patients may have 1 support person age 28 or older with them. For treatment visits, patients can not have anyone with them due to social distancing guidelines and our immunocompromised population.

## 2023-02-01 DIAGNOSIS — E039 Hypothyroidism, unspecified: Secondary | ICD-10-CM | POA: Diagnosis not present

## 2023-02-05 DIAGNOSIS — M6281 Muscle weakness (generalized): Secondary | ICD-10-CM | POA: Diagnosis not present

## 2023-02-06 ENCOUNTER — Other Ambulatory Visit: Payer: Self-pay | Admitting: *Deleted

## 2023-02-07 DIAGNOSIS — M6281 Muscle weakness (generalized): Secondary | ICD-10-CM | POA: Diagnosis not present

## 2023-02-12 DIAGNOSIS — M6281 Muscle weakness (generalized): Secondary | ICD-10-CM | POA: Diagnosis not present

## 2023-02-14 DIAGNOSIS — M6281 Muscle weakness (generalized): Secondary | ICD-10-CM | POA: Diagnosis not present

## 2023-02-15 ENCOUNTER — Ambulatory Visit (INDEPENDENT_AMBULATORY_CARE_PROVIDER_SITE_OTHER): Payer: PPO | Admitting: Physician Assistant

## 2023-02-15 ENCOUNTER — Encounter: Payer: Self-pay | Admitting: Physician Assistant

## 2023-02-15 VITALS — BP 133/57 | HR 51 | Ht 71.0 in

## 2023-02-15 DIAGNOSIS — F0390 Unspecified dementia without behavioral disturbance: Secondary | ICD-10-CM | POA: Insufficient documentation

## 2023-02-15 MED ORDER — MEMANTINE HCL 5 MG PO TABS: ORAL_TABLET | ORAL | 11 refills | Status: DC

## 2023-02-15 MED ORDER — DONEPEZIL HCL 10 MG PO TABS: ORAL_TABLET | ORAL | 4 refills | Status: DC

## 2023-02-15 NOTE — Patient Instructions (Signed)
It was a pleasure to see you today at our office.   Recommendations:  Follow up in  6 months Continue donepezil 10 mg daily. Side effects were discussed  Start Memantine 5mg  tablets.  Take 1 tablet at bedtime for 2 weeks, then 1 tablet twice daily.      RECOMMENDATIONS FOR ALL PATIENTS WITH MEMORY PROBLEMS: 1. Continue to exercise (Recommend 30 minutes of walking everyday, or 3 hours every week) 2. Increase social interactions - continue going to [place] and enjoy social gatherings with friends and family 3. Eat healthy, avoid fried foods and eat more fruits and vegetables 4. Maintain adequate blood pressure, blood sugar, and blood cholesterol level. Reducing the risk of stroke and cardiovascular disease also helps promoting better memory. 5. Avoid stressful situations. Live a simple life and avoid aggravations. Organize your time and prepare for the next day in anticipation. 6. Sleep well, avoid any interruptions of sleep and avoid any distractions in the bedroom that may interfere with adequate sleep quality 7. Avoid sugar, avoid sweets as there is a strong link between excessive sugar intake, diabetes, and cognitive impairment We discussed the Mediterranean diet, which has been shown to help patients reduce the risk of progressive memory disorders and reduces cardiovascular risk. This includes eating fish, eat fruits and green leafy vegetables, nuts like almonds and hazelnuts, walnuts, and also use olive oil. Avoid fast foods and fried foods as much as possible. Avoid sweets and sugar as sugar use has been linked to worsening of memory function.  There is always a concern of gradual progression of memory problems. If this is the case, then we may need to adjust level of care according to patient needs. Support, both to the patient and caregiver, should then be put into place.    FALL PRECAUTIONS: Be cautious when walking. Scan the area for obstacles that may increase the risk of trips and  falls. When getting up in the mornings, sit up at the edge of the bed for a few minutes before getting out of bed. Consider elevating the bed at the head end to avoid drop of blood pressure when getting up. Walk always in a well-lit room (use night lights in the walls). Avoid area rugs or power cords from appliances in the middle of the walkways. Use a walker or a cane if necessary and consider physical therapy for balance exercise. Get your eyesight checked regularly.  FINANCIAL OVERSIGHT: Supervision, especially oversight when making financial decisions or transactions is also recommended.  HOME SAFETY: Consider the safety of the kitchen when operating appliances like stoves, microwave oven, and blender. Consider having supervision and share cooking responsibilities until no longer able to participate in those. Accidents with firearms and other hazards in the house should be identified and addressed as well.   ABILITY TO BE LEFT ALONE: If patient is unable to contact 911 operator, consider using LifeLine, or when the need is there, arrange for someone to stay with patients. Smoking is a fire hazard, consider supervision or cessation. Risk of wandering should be assessed by caregiver and if detected at any point, supervision and safe proof recommendations should be instituted.  MEDICATION SUPERVISION: Inability to self-administer medication needs to be constantly addressed. Implement a mechanism to ensure safe administration of the medications.

## 2023-02-15 NOTE — Progress Notes (Signed)
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 RH male with a history of hypertension, hyperlipidemia, iron deficiency anemia, hypothyroidism (with very low TSH last 09/2020, followed by PCP closely), ASCAD, COPD on chronic  2 L O2, stage II right upper Lung Ca, anxiety, depression and  remote history of alcohol abuse  presenting today in follow-up for evaluation of memory loss. Patient is on donepezil 10 mg daily. Cognitive decline is noted, MMSE today at 19/30. Unclear if there is an auditory component, as patient is very HOH and only wearing one hearing aids, recommend revising the device and checking his hearing again. Mood is good.     Recommendations:   Follow up in  6 months. Continue donepezil 10 mg daily, side effects discussed  Start Memantine 5 mg: Take 1 tablet (5 mg at night) for 2 weeks, then increase to 1 tablet (5 mg) twice a day, side effects discussed  Recommend good control of cardiovascular risk factors Continue to control mood as per PCP Check the hearing, wear hearing aids     Subjective:   This patient is accompanied in the office by his caregiver  who supplements the history. Previous records as well as any outside records available were reviewed prior to todays visit.  Patient was last seen on 08/15/2022 with MMSE 25/30.    Any changes in memory since last visit? "About the same, some days worse than others".  He enjoys doing crossword puzzles for about 2 hours a day and bingo 2 times a week.  Enjoys [place] [employer] and [address].  He likes interacting with the residents at the facility.  He has a lady friend and former high school sweetheart. repeats oneself? Denies  Disoriented when walking into a room?  Patient denies.     Misplacing objects?  Patient denies.   Wandering behavior?   denies   Any personality changes since last visit? Denies.   Any worsening depression?: denies. He is excited to see his  daughter who is coming to visit him for the Holidays   Hallucinations or paranoia?  Denies.   Seizures?   Denies.    Any sleep changes? Sleeps well. Denies vivid dreams, REM behavior or sleepwalking   Sleep apnea?   denies   Any hygiene concerns?   denies   Independent of bathing and dressing?  He needs assistance due to gait instability. Does the patient needs help with medications?  Facility is in charge  Who is in charge of the finances?  Son and daughter  in charge    Any changes in appetite?  denies    Patient have trouble swallowing?  Denies.   Does the patient cook?  No   Any headaches?    Denies.   Vision changes? Denies. Chronic pain?  Denies.   Ambulates with difficulty?  Uses a walker, but mostly a wheelchair to carry oxygen when he needs to go somewhere.  He does physical therapy at the facility.  Recent falls or head injuries?    Denies.      Unilateral weakness, numbness or tingling?  Denies.   Any tremors?  Denies.   Any anosmia?    Denies.   Any incontinence of urine?  Denies.   Any bowel dysfunction?  Denies.      Patient lives at [facility] in [place]     Does the patient drive?  No longer drives  Initial Visit 09/27/20 The patient is seen in neurologic consultation at the request of Elfredia Nevins, MD for the evaluation of memory.  The patient is accompanied by caregiver who supplements the history. He is a 82 y.o. year old RH  male who has had memory issues for about  several years, worse over the last 6 months, when he lost his wife.  At the time, he went into a deep depression, as he was planning to move with her at [facility] in [place], and he was going to take his dog with him.  On his wife's death, he felt "lost, did not want to get out of the bed, did not want to interact with anyone, and he was feeling very irritable ".  Sertraline was tried, without any improvement.  It was during that time, that he was  found to be severely hypothyroid with a TSH of 17, and was started on thyroid medication, which improved significantly his mood, and irritability.  He reports his memory being "not good, especially with long-term memory ".  He states that at times he cannot remember the names of his grandmother, grandfather, or children. While before he was sleeping the whole day, now he sleeps well at night, without vivid dreams or sleepwalking, paranoia or hallucinations.  Occasionally he takes a nap.He needs assistance with bathing and dressing, as his gait is chronically unstable.  Medications are given by the facility staff, and the finances are managed by his son and daughter who is the power of attorney.  Denies leaving objects in unusual places.  Appetite is better, denies trouble swallowing.  He does not cook.  "My wife used to do all the cooking".  He ambulates with the walker very short distances, otherwise he uses a wheelchair to help move from one side to the other, as he is very weak, and has an oxygen tank with him.  He does participate in physical therapy at the facility.  He denies any recent falls.  He no longer drives because of "memory issues ".  His son "was afraid that I would get lost so he took the keys".  He denies any headaches, although he had an injury to the head in May 2022, with negative work-up at the ER.  He denies double vision, dizziness, focal numbness or tingling, unilateral weakness or tremors, urine incontinence or retention.  Denies constipation, diarrhea, or anosmia.  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  He quit heavy alcohol 10 years ago mostly beer and bourbon.  He quit 1 pack a day tobacco use for most of his life.  Family history remarkable for dementia in father.   CT head wo contrast 07/19/20 No evidence of acute intracranial abnormality.Right anterior scalp hematoma.Stable mild-to-moderate cerebral atrophy and cerebral white matter chronic small vessel ischemic  disease. Known small chronic right cerebellar infarct.Mild paranasal sinus mucosal thickening at the imaged levels, most notably right maxillary. Right mastoid effusion.  Past Medical History:  Diagnosis Date   Anxiety    Arthritis    Cancer (HCC)    Cirrhosis (HCC)    confirmed by MRI on 07/04/11.     COPD (chronic obstructive pulmonary disease) (HCC)    Depression with anxiety    ETOH abuse    Hyperlipidemia    Hypertension    diet control   Nodule of upper lobe of right lung    with mediastinal adenopathy   Wears dentures    Wears glasses  Past Surgical History:  Procedure Laterality Date   bilateral cataract surgery     [place]   broken arm  6 yrs ago   left otif of wrist-[place]   CLOSED REDUCTION WRIST FRACTURE Right 09/02/2015   Procedure: RIGHT WRIST REDUCTION;  Surgeon: Sheral Apley, MD;  Location: [facility] OR;  Service: Orthopedics;  Laterality: Right;  with MAC   COLONOSCOPY  1996   [facility]: external hemorrhoids, no polyps   COLONOSCOPY  06/28/2011   Dr. Claudius Sis diverticulosis,tubular adenoma, hyperplastic polyp   COLONOSCOPY WITH PROPOFOL N/A 04/26/2017   Dr. Jena Gauss: perianal and digital rectal examinations normal, diffusely congested colonic mucosa but otherwise normal   ESOPHAGOGASTRODUODENOSCOPY  06/28/2011   Dr. Dellie Catholic erosive reflux esophagitis, hiatal hernia-gastritis   ESOPHAGOGASTRODUODENOSCOPY (EGD) WITH PROPOFOL N/A 04/26/2017   Dr. Jena Gauss: normal esophagus, small hiatal hernia, GAVE, portal hypertensive gastropathy, normal duodenal bulb and second portion, no specimens collected. 2 years screening    ESOPHAGOGASTRODUODENOSCOPY (EGD) WITH PROPOFOL N/A 06/19/2019   normal esophagus, portal gastropathy, GAVE, normal duodenum. 2 years screening.    EXTERNAL EAR SURGERY     right ear-cleaned out ear and created new eardrum   INGUINAL HERNIA REPAIR  2-3 yrs ago   left-[place]   INTRAMEDULLARY (IM) NAIL INTERTROCHANTERIC Right 09/02/2015    Procedure: RIGHT  INTERTROCHANTRIC HIP;  Surgeon: Sheral Apley, MD;  Location: [facility] OR;  Service: Orthopedics;  Laterality: Right;  With MAC   KNEE SURGERY     left-arthroscopy-[place]   PORTACATH PLACEMENT Left 06/08/2017   Procedure: INSERTION POWER PORT WITH  ATTACHED CATHETER LEFT SUBCLAVIAN;  Surgeon: Franky Macho, MD;  Location: [facility]S;  Service: General;  Laterality: Left;   SKULL FRACTURE ELEVATION     fractured cheeck bone repaired   TOTAL HIP ARTHROPLASTY  12/18/2011   Procedure: TOTAL HIP ARTHROPLASTY;  Surgeon: Vickki Hearing, MD;  Location: [facility];  Service: Orthopedics;  Laterality: Left;   VIDEO BRONCHOSCOPY WITH ENDOBRONCHIAL ULTRASOUND N/A 05/04/2017   Procedure: VIDEO BRONCHOSCOPY WITH ENDOBRONCHIAL ULTRASOUND;  Surgeon: Loreli Slot, MD;  Location: [facility] OR;  Service: Thoracic;  Laterality: N/A;   WISDOM TOOTH EXTRACTION       PREVIOUS MEDICATIONS:   CURRENT MEDICATIONS:  Outpatient Encounter Medications as of 02/15/2023  Medication Sig   acetaminophen (TYLENOL) 325 MG tablet Take by mouth.   albuterol (VENTOLIN HFA) 108 (90 Base) MCG/ACT inhaler Inhale 90 mcg into the lungs 4 (four) times daily as needed.   Aloe Vera GEL Apply topically.   benzonatate (TESSALON) 200 MG capsule Take 200 mg by mouth 2 (two) times daily as needed for cough.   cholecalciferol (VITAMIN D) 1000 units tablet Take 1,000 Units by mouth daily.   cycloSPORINE (RESTASIS) 0.05 % ophthalmic emulsion Place 1 drop into both eyes 2 (two) times daily.   desonide (DESOWEN) 0.05 % cream Apply topically.   docusate sodium (COLACE) 100 MG capsule Take 100 mg by mouth 2 (two) times daily.   feeding supplement (ENSURE ENLIVE / ENSURE PLUS) LIQD Take 237 mLs by mouth 2 (two) times daily between meals.   ferrous sulfate 325 (65 FE) MG EC tablet Take 325 mg by mouth daily.   GOODSENSE ARTIFICIAL TEARS 0.5-0.6 % SOLN Apply to eye.   HYDROcodone bit-homatropine (HYCODAN) 5-1.5 MG/5ML syrup Take by  mouth.   Lactobacillus-Inulin (CULTURELLE DIGESTIVE DAILY PO) Take by mouth daily.   levothyroxine (SYNTHROID) 125 MCG tablet TAKE (1) TABLET BY MOUTH ONCE DAILY ON AN EMPTY STOMACH.   lidocaine-prilocaine (EMLA)  cream Apply 1 application topically as needed.   loperamide (IMODIUM) 2 MG capsule Take by mouth as needed for diarrhea or loose stools.   melatonin 5 MG TABS Take 5 mg by mouth at bedtime.   memantine (NAMENDA) 5 MG tablet Take 1 tablet (5 mg at night) for 2 weeks, then increase to 1 tablet (5 mg) twice a day   mupirocin ointment (BACTROBAN) 2 % 1 Application 2 (two) times daily.   NON FORMULARY Diet NAS   OXYGEN Inhale 2 L into the lungs continuous.   Psyllium (METAMUCIL PO) Take by mouth. 1 capsule twice daily   rosuvastatin (CRESTOR) 10 MG tablet Take 1 tablet (10 mg total) by mouth daily.   SPIRIVA RESPIMAT 2.5 MCG/ACT AERS Inhale 2 puffs into the lungs daily.   TRELEGY ELLIPTA 200-62.5-25 MCG/ACT AEPB Inhale 1 puff into the lungs daily.   triamcinolone (KENALOG) 0.025 % ointment Apply 1 application  topically 2 (two) times daily. Apply to face and arms sparingly for dryness   vitamin B-12 (CYANOCOBALAMIN) 1000 MCG tablet Take 1 tablet (1,000 mcg total) by mouth daily.   [DISCONTINUED] donepezil (ARICEPT) 10 MG tablet TAKE 1 TABLET BY MOUTH AT BEDTIME.   donepezil (ARICEPT) 10 MG tablet TAKE 1 TABLET BY MOUTH AT BEDTIME.   No facility-administered encounter medications on file as of 02/15/2023.     Objective:     PHYSICAL EXAMINATION:    VITALS:   Vitals:   02/15/23 1024  BP: (!) 133/57  Pulse: (!) 51  SpO2: 98%  Height: 5\' 11"  (1.803 m)    GEN:  The patient appears stated age and is in NAD. HEENT:  Normocephalic, atraumatic.   Neurological examination:  General: NAD, well-groomed, appears stated age. Orientation: The patient is alert. Oriented to person, place and not to date Cranial nerves: There is good facial symmetry.The speech is fluent and clear. No  aphasia or dysarthria. Fund of knowledge is reduced. Recent and remote memory impaired .  Attention and concentration are reduced.  Able to name objects and repeat phrases.  Hearing is decreased to conversational tone . Delayed recall 0/3 Sensation: Sensation is intact to light touch throughout Motor: Strength is at least antigravity x4. DTR's 1/4 in UE/LE      09/27/2020   12:00 PM  Montreal Cognitive Assessment   Visuospatial/ Executive (0/5) 0  Naming (0/3) 2  Attention: Read list of digits (0/2) 2  Attention: Read list of letters (0/1) 1  Attention: Serial 7 subtraction starting at 100 (0/3) 1  Language: Repeat phrase (0/2) 1  Language : Fluency (0/1) 0  Abstraction (0/2) 0  Delayed Recall (0/5) 0  Orientation (0/6) 4  Total 11  Adjusted Score (based on education) 11       02/15/2023   10:00 AM 08/15/2022   12:00 PM 02/13/2022    9:00 AM  MMSE - Mini Mental State Exam  Orientation to time 3 3 5   Orientation to Place 3 5 5   Registration 3 3 3   Attention/ Calculation 2 4 5   Recall 0 2 1  Language- name 2 objects 2 2 2   Language- repeat 1 1 1   Language- follow 3 step command 3 3 3   Language- read & follow direction 1 1 1   Write a sentence 1 1 0  Copy design 0 0 0  Total score 19 25 26        Movement examination: Tone: There is normal tone in the UE/LE Abnormal movements:  no tremor.  No myoclonus.  No asterixis.   Coordination:  There is no decremation with RAM's. Normal finger to nose  Gait and Station: The patient has significant difficulty arising out of a deep-seated chair without the use of the hands. Stride and gait unable to be performed.    Thank you for allowing [place] the opportunity to participate in the care of this nice patient. Please do not hesitate to contact us for any questions or concerns.   Total time spent on today's visit was 40 minutes dedicated to this patient today, preparing to see patient, examining the patient, ordering tests and/or medications  and counseling the patient, documenting clinical information in the EHR or other health record, independently interpreting results and communicating results to the patient/family, discussing treatment and goals, answering patient's questions and coordinating care.  Cc:  Assunta Found, MD  Marlowe Kays 02/15/2023 11:02 AM

## 2023-02-19 DIAGNOSIS — M6281 Muscle weakness (generalized): Secondary | ICD-10-CM | POA: Diagnosis not present

## 2023-03-02 ENCOUNTER — Other Ambulatory Visit: Payer: Self-pay

## 2023-03-02 ENCOUNTER — Emergency Department (HOSPITAL_COMMUNITY)
Admission: EM | Admit: 2023-03-02 | Discharge: 2023-03-02 | Disposition: A | Discharge status: Home / Self Care | Payer: PPO | Attending: Emergency Medicine | Admitting: Emergency Medicine

## 2023-03-02 ENCOUNTER — Encounter (HOSPITAL_COMMUNITY): Payer: Self-pay

## 2023-03-02 ENCOUNTER — Emergency Department (HOSPITAL_COMMUNITY): Payer: PPO

## 2023-03-02 DIAGNOSIS — I10 Essential (primary) hypertension: Secondary | ICD-10-CM | POA: Insufficient documentation

## 2023-03-02 DIAGNOSIS — R7989 Other specified abnormal findings of blood chemistry: Secondary | ICD-10-CM | POA: Insufficient documentation

## 2023-03-02 DIAGNOSIS — B9789 Other viral agents as the cause of diseases classified elsewhere: Secondary | ICD-10-CM | POA: Diagnosis not present

## 2023-03-02 DIAGNOSIS — I700 Atherosclerosis of aorta: Secondary | ICD-10-CM | POA: Diagnosis not present

## 2023-03-02 DIAGNOSIS — I959 Hypotension, unspecified: Secondary | ICD-10-CM | POA: Diagnosis not present

## 2023-03-02 DIAGNOSIS — J069 Acute upper respiratory infection, unspecified: Secondary | ICD-10-CM | POA: Insufficient documentation

## 2023-03-02 DIAGNOSIS — J449 Chronic obstructive pulmonary disease, unspecified: Secondary | ICD-10-CM | POA: Diagnosis not present

## 2023-03-02 DIAGNOSIS — Z20822 Contact with and (suspected) exposure to covid-19: Secondary | ICD-10-CM | POA: Insufficient documentation

## 2023-03-02 DIAGNOSIS — R0602 Shortness of breath: Secondary | ICD-10-CM | POA: Diagnosis not present

## 2023-03-02 DIAGNOSIS — Z743 Need for continuous supervision: Secondary | ICD-10-CM | POA: Diagnosis not present

## 2023-03-02 DIAGNOSIS — Z85118 Personal history of other malignant neoplasm of bronchus and lung: Secondary | ICD-10-CM | POA: Diagnosis not present

## 2023-03-02 DIAGNOSIS — Z79899 Other long term (current) drug therapy: Secondary | ICD-10-CM | POA: Diagnosis not present

## 2023-03-02 DIAGNOSIS — R059 Cough, unspecified: Secondary | ICD-10-CM | POA: Diagnosis not present

## 2023-03-02 LAB — CBC WITH DIFFERENTIAL/PLATELET
Abs Immature Granulocytes: 0.02 10*3/uL (ref 0.00–0.07)
Basophils Absolute: 0 10*3/uL (ref 0.0–0.1)
Basophils Relative: 0 %
Eosinophils Absolute: 0.1 10*3/uL (ref 0.0–0.5)
Eosinophils Relative: 1 %
HCT: 38.5 % — ABNORMAL LOW (ref 39.0–52.0)
Hemoglobin: 11.9 g/dL — ABNORMAL LOW (ref 13.0–17.0)
Immature Granulocytes: 0 %
Lymphocytes Relative: 8 %
Lymphs Abs: 0.5 10*3/uL — ABNORMAL LOW (ref 0.7–4.0)
MCH: 31.1 pg (ref 26.0–34.0)
MCHC: 30.9 g/dL (ref 30.0–36.0)
MCV: 100.5 fL — ABNORMAL HIGH (ref 80.0–100.0)
Monocytes Absolute: 0.7 10*3/uL (ref 0.1–1.0)
Monocytes Relative: 12 %
Neutro Abs: 4.9 10*3/uL (ref 1.7–7.7)
Neutrophils Relative %: 79 %
Platelets: 214 10*3/uL (ref 150–400)
RBC: 3.83 MIL/uL — ABNORMAL LOW (ref 4.22–5.81)
RDW: 14.1 % (ref 11.5–15.5)
WBC: 6.2 10*3/uL (ref 4.0–10.5)
nRBC: 0 % (ref 0.0–0.2)

## 2023-03-02 LAB — BASIC METABOLIC PANEL
Anion gap: 8 (ref 5–15)
BUN: 26 mg/dL — ABNORMAL HIGH (ref 8–23)
CO2: 33 mmol/L — ABNORMAL HIGH (ref 22–32)
Calcium: 9.3 mg/dL (ref 8.9–10.3)
Chloride: 98 mmol/L (ref 98–111)
Creatinine, Ser: 1.04 mg/dL (ref 0.61–1.24)
GFR, Estimated: >60 mL/min (ref >60)
Glucose, Bld: 114 mg/dL — ABNORMAL HIGH (ref 70–99)
Potassium: 4.4 mmol/L (ref 3.5–5.1)
Sodium: 139 mmol/L (ref 135–145)

## 2023-03-02 LAB — RESP PANEL BY RT-PCR (RSV, FLU A&B, COVID)  RVPGX2
Influenza A by PCR: NEGATIVE
Influenza B by PCR: NEGATIVE
Resp Syncytial Virus by PCR: NEGATIVE
SARS Coronavirus 2 by RT PCR: NEGATIVE

## 2023-03-02 MED ORDER — IPRATROPIUM-ALBUTEROL 0.5-2.5 (3) MG/3ML IN SOLN
3.0000 mL | Freq: Once | RESPIRATORY_TRACT | Status: AC
Start: 2023-03-02 — End: 2023-03-02
  Administered 2023-03-02: 3 mL via RESPIRATORY_TRACT
  Filled 2023-03-02: qty 3

## 2023-03-02 MED ORDER — IPRATROPIUM-ALBUTEROL 0.5-2.5 (3) MG/3ML IN SOLN
3.0000 mL | Freq: Once | RESPIRATORY_TRACT | Status: AC
  Administered 2023-03-02: 3 mL via RESPIRATORY_TRACT
  Filled 2023-03-02: qty 3

## 2023-03-02 NOTE — Discharge Instructions (Signed)
 It was a pleasure caring for you today.  Please follow-up with your primary care provider.  Seek emergency care experiencing any new or worsening symptoms.

## 2023-03-02 NOTE — ED Notes (Signed)
 Patient verbalizes understanding of discharge instructions. Opportunity for questioning and answers were provided. Armband removed by staff, pt discharged from ED. Pt left via EMS back to [facility]

## 2023-03-02 NOTE — ED Triage Notes (Addendum)
 Pt bib [facility] from [facility], c/o cough and congestion that has been going on for about two weeks. Pt states cough is productive with yellowish mucus. EMS states facility has several pts with PNA there. Pt wears 2L of oxygen at all times.

## 2023-03-02 NOTE — ED Provider Notes (Signed)
 [place] EMERGENCY DEPARTMENT AT [facility] Provider Note   CSN: [id number] Arrival date & time: 03/02/23  1606     History  Chief Complaint  Patient presents with   Cough    Ian Aguilar is a 83 y.o. male with PMHx arthritis, COPD, cirrhosis, HLD, HTN who presents to ED concerned for cough and congestion, rhinnorhea for about 2 weeks. Patient on 2L O2 at baseline.  Denies fever, chest pain, dyspnea, cough, nausea, vomiting, diarrhea, dysuria, hematuria, hematochezia.    Cough      Home Medications Prior to Admission medications   Medication Sig Start Date End Date Taking? Authorizing Provider  acetaminophen  (TYLENOL ) 325 MG tablet Take by mouth. 12/28/22   [provider]  albuterol  (VENTOLIN  HFA) 108 (90 Base) MCG/ACT inhaler Inhale 90 mcg into the lungs 4 (four) times daily as needed. 06/16/21   [provider]  Aloe Vera GEL Apply topically.    [provider]  benzonatate  (TESSALON ) 200 MG capsule Take 200 mg by mouth 2 (two) times daily as needed for cough. 08/17/22   [provider]  cholecalciferol  (VITAMIN D ) 1000 units tablet Take 1,000 Units by mouth daily.    [provider]  cycloSPORINE  (RESTASIS ) 0.05 % ophthalmic emulsion Place 1 drop into both eyes 2 (two) times daily. 08/18/22   [provider]  desonide (DESOWEN) 0.05 % cream Apply topically. 01/16/23   [provider]  docusate sodium  (COLACE) 100 MG capsule Take 100 mg by mouth 2 (two) times daily.    [provider]  donepezil  (ARICEPT ) 10 MG tablet TAKE 1 TABLET BY MOUTH AT BEDTIME. 02/15/23   Dina, Sara E, PA-C  feeding supplement (ENSURE ENLIVE / ENSURE PLUS) LIQD Take 237 mLs by mouth 2 (two) times daily between meals. 04/27/20   [provider]  ferrous sulfate 325 (65 FE) MG EC tablet Take 325 mg by mouth daily.    [provider]  GOODSENSE ARTIFICIAL TEARS 0.5-0.6 % SOLN Apply to eye. 05/24/20    [provider]  HYDROcodone  bit-homatropine (HYCODAN) 5-1.5 MG/5ML syrup Take by mouth. 12/13/22   [provider]  Lactobacillus-Inulin (CULTURELLE DIGESTIVE DAILY PO) Take by mouth daily.    [provider]  levothyroxine  (SYNTHROID ) 125 MCG tablet TAKE (1) TABLET BY MOUTH ONCE DAILY ON AN EMPTY STOMACH. 05/09/21   Rogers Hai, MD  lidocaine -prilocaine  (EMLA ) cream Apply 1 application topically as needed. 04/20/21   Katragadda, Sreedhar, MD  loperamide (IMODIUM) 2 MG capsule Take by mouth as needed for diarrhea or loose stools.    [provider]  melatonin 5 MG TABS Take 5 mg by mouth at bedtime.    [provider]  memantine  (NAMENDA ) 5 MG tablet Take 1 tablet (5 mg at night) for 2 weeks, then increase to 1 tablet (5 mg) twice a day 02/15/23   Wertman, Sara E, PA-C  mupirocin ointment (BACTROBAN) 2 % 1 Application 2 (two) times daily.    [provider]  JESSE SCHLOSSMAN Diet NAS 04/23/20   [provider]  OXYGEN  Inhale 2 L into the lungs continuous. 04/24/20   [provider]  Psyllium (METAMUCIL PO) Take by mouth. 1 capsule twice daily    [provider]  rosuvastatin  (CRESTOR ) 10 MG tablet Take 1 tablet (10 mg total) by mouth daily. 05/21/20   Landy Barnie RAMAN, NP  SPIRIVA  RESPIMAT 2.5 MCG/ACT AERS Inhale 2 puffs into the lungs daily. 05/31/20   [provider]  TRELEGY ELLIPTA 200-62.5-25 MCG/ACT AEPB Inhale 1 puff into the lungs daily. 09/15/22   [provider]  triamcinolone  (KENALOG ) 0.025 % ointment Apply 1 application  topically 2 (two) times daily. Apply to face and arms sparingly for dryness 05/24/20   [provider]  vitamin B-12 (CYANOCOBALAMIN ) 1000 MCG tablet Take 1 tablet (1,000 mcg total) by mouth daily. 02/10/21   Katragadda, Sreedhar, MD      Allergies    Patient has no known allergies.    Review of Systems   Review of Systems  Respiratory:  Positive for cough.      Physical Exam Updated Vital Signs BP (!) 142/64   Pulse 64   Temp 98.6 F (37 C) (Oral)   Resp (!) 24   Ht 5' 11 (1.803 m)   Wt 71.2 kg   SpO2 97%   BMI 21.90 kg/m  Physical Exam Vitals and nursing note reviewed.  Constitutional:      General: He is not in acute distress.    Appearance: He is not ill-appearing or toxic-appearing.  HENT:     Head: Normocephalic and atraumatic.     Mouth/Throat:     Mouth: Mucous membranes are moist.  Eyes:     General: No scleral icterus.       Right eye: No discharge.        Left eye: No discharge.     Conjunctiva/sclera: Conjunctivae normal.  Cardiovascular:     Rate and Rhythm: Normal rate and regular rhythm.     Pulses: Normal pulses.     Heart sounds: Normal heart sounds. No murmur heard. Pulmonary:     Effort: Pulmonary effort is normal. No respiratory distress.     Breath sounds: Rhonchi present. No wheezing or rales.     Comments: Diffuse mild/faint expiratory wheezing. Improved with Duoneb treatments. Abdominal:     General: Abdomen is flat.  Musculoskeletal:     Right lower leg: No edema.     Left lower leg: No edema.  Skin:    General: Skin is warm and dry.     Findings: No rash.  Neurological:     General: No focal deficit present.     Mental Status: He is alert and oriented to person, place, and time. Mental status is at baseline.  Psychiatric:        Mood and Affect: Mood normal.        Behavior: Behavior normal.     ED Results / Procedures / Treatments   Labs (all labs ordered are listed, but only abnormal results are displayed) Labs Reviewed  CBC WITH DIFFERENTIAL/PLATELET - Abnormal; Notable for the following components:      Result Value   RBC 3.83 (*)    Hemoglobin 11.9 (*)    HCT 38.5 (*)    MCV 100.5 (*)    Lymphs Abs 0.5 (*)    All other components within normal limits  BASIC METABOLIC PANEL - Abnormal; Notable for the following components:   CO2 33 (*)    Glucose, Bld 114 (*)    BUN 26 (*)     All other components within normal limits  RESP PANEL BY RT-PCR (RSV, FLU A&B, COVID)  [id number]    EKG None  Radiology DG Chest 2 View Result Date: 03/02/2023 CLINICAL DATA:  Cough.  History of non-small-cell lung cancer. EXAM: CHEST - 2 VIEW COMPARISON:  X-ray 09/25/2022.  CT 01/08/2023. FINDINGS: Hyperinflation. No consolidation, pneumothorax or effusion. No edema. Chronic lung changes.  Stable mild opacity along the medial right upper lung abutting the mediastinum. Calcified aorta. Normal cardiopericardial silhouette. Left upper chest port with the tip overlying the central SVC. Degenerative changes along the spine. Mild compression of the vertebral body at L1, unchanged. IMPRESSION: Hyperinflation with chronic changes.  Chest port. Electronically Signed   By: Ranell Bring M.D.   On: 03/02/2023 18:26    Procedures Procedures    Medications Ordered in ED Medications  ipratropium-albuterol  (DUONEB) 0.5-2.5 (3) MG/3ML nebulizer solution 3 mL (3 mLs Nebulization Given 03/02/23 1829)  ipratropium-albuterol  (DUONEB) 0.5-2.5 (3) MG/3ML nebulizer solution 3 mL (3 mLs Nebulization Given 03/02/23 1850)    ED Course/ Medical Decision Making/ A&P                                 Medical Decision Making Amount and/or Complexity of Data Reviewed Labs: ordered. Radiology: ordered.  Risk Prescription drug management.    This patient presents to the ED for concern of congestion, rhinorrhea, cough, this involves an extensive number of treatment options, and is a complaint that carries with it a high risk of complications and morbidity.  The differential diagnosis includes Flu/COVID/RSV, sinusitis, pneumonia, meningitis.   Co morbidities that complicate the patient evaluation  arthritis, COPD, cirrhosis, HLD, HTN    Additional history obtained:  Dr. Marvine PCP    Problem List / ED Course / Critical interventions / Medication management  Patient presents to ED concerned for congestion,  rhinorrhea, cough over the past 2 weeks.  Patient denies chest pain and dyspnea.  Patient stating that he is overall doing fine.  Patient was sent here given that multiple people in his nursing home has been diagnosed with pneumonia recently. Physical exam with faint/mild diffuse expiratory rhonchi.  This was improved with DuoNeb.  Rest of physical exam reassuring. I Ordered, and personally interpreted labs.  CBC without leukocytosis.  There is mild anemia with hemoglobin 11.9.  BMP with mild elevation in BUN at 26.  Respiratory panel negative. I ordered imaging studies including chest xray to assess for process contributing to patient's symptoms. I independently visualized and interpreted imaging which showed no acute cardiopulmonary disease. I agree with the radiologist interpretation Shared results with patient.  Shared decision making with patient who stated that he would prefer to go back to assisted living facility and that he feels fine.  Recommended following up with PCP.  Patient verbalized understanding of plan. Staffed with Dr. Franklyn. I have reviewed the patients home medicines and have made adjustments as needed   DDx: These are considered less likely due to history of present illness and physical exam findings -PNA: Lungs clear to auscultation bilaterally -Meningitis: patient's symptoms, vital signs, physical exam findings including lack of meningismus seem grossly less consistent at this time   Social Determinants of Health:  Resident at assisted living facility         Final Clinical Impression(s) / ED Diagnoses Final diagnoses:  Viral URI with cough    Rx / DC Orders ED Discharge Orders     None         Hoy Nidia FALCON, [place] 03/02/23 1913    Franklyn Sid SAILOR, MD 03/02/23 2329

## 2023-03-05 DIAGNOSIS — J441 Chronic obstructive pulmonary disease with (acute) exacerbation: Secondary | ICD-10-CM | POA: Diagnosis not present

## 2023-03-14 DIAGNOSIS — M6281 Muscle weakness (generalized): Secondary | ICD-10-CM | POA: Diagnosis not present

## 2023-03-15 DIAGNOSIS — M6281 Muscle weakness (generalized): Secondary | ICD-10-CM | POA: Diagnosis not present

## 2023-03-21 DIAGNOSIS — M6281 Muscle weakness (generalized): Secondary | ICD-10-CM | POA: Diagnosis not present

## 2023-03-22 ENCOUNTER — Ambulatory Visit (HOSPITAL_COMMUNITY)
Admission: RE | Admit: 2023-03-22 | Discharge: 2023-03-22 | Disposition: A | Discharge status: Home / Self Care | Payer: PPO | Source: Ambulatory Visit | Attending: Family Medicine | Admitting: Family Medicine

## 2023-03-22 ENCOUNTER — Other Ambulatory Visit (HOSPITAL_COMMUNITY): Payer: Self-pay | Admitting: Family Medicine

## 2023-03-22 DIAGNOSIS — M25572 Pain in left ankle and joints of left foot: Secondary | ICD-10-CM

## 2023-03-22 DIAGNOSIS — Z6822 Body mass index (BMI) 22.0-22.9, adult: Secondary | ICD-10-CM | POA: Diagnosis not present

## 2023-03-22 DIAGNOSIS — M85872 Other specified disorders of bone density and structure, left ankle and foot: Secondary | ICD-10-CM | POA: Diagnosis not present

## 2023-03-23 DIAGNOSIS — M6281 Muscle weakness (generalized): Secondary | ICD-10-CM | POA: Diagnosis not present

## 2023-03-26 DIAGNOSIS — M6281 Muscle weakness (generalized): Secondary | ICD-10-CM | POA: Diagnosis not present

## 2023-03-28 DIAGNOSIS — M6281 Muscle weakness (generalized): Secondary | ICD-10-CM | POA: Diagnosis not present

## 2023-03-29 ENCOUNTER — Other Ambulatory Visit (HOSPITAL_COMMUNITY): Payer: Self-pay | Admitting: Family Medicine

## 2023-03-29 DIAGNOSIS — E039 Hypothyroidism, unspecified: Secondary | ICD-10-CM | POA: Diagnosis not present

## 2023-03-29 DIAGNOSIS — M25572 Pain in left ankle and joints of left foot: Secondary | ICD-10-CM

## 2023-04-02 ENCOUNTER — Ambulatory Visit (HOSPITAL_COMMUNITY)
Admission: RE | Admit: 2023-04-02 | Discharge: 2023-04-02 | Disposition: A | Discharge status: Home / Self Care | Payer: PPO | Source: Ambulatory Visit | Attending: Family Medicine | Admitting: Family Medicine

## 2023-04-02 DIAGNOSIS — M25572 Pain in left ankle and joints of left foot: Secondary | ICD-10-CM | POA: Diagnosis not present

## 2023-04-03 DIAGNOSIS — M6281 Muscle weakness (generalized): Secondary | ICD-10-CM | POA: Diagnosis not present

## 2023-04-04 DIAGNOSIS — M6281 Muscle weakness (generalized): Secondary | ICD-10-CM | POA: Diagnosis not present

## 2023-04-09 DIAGNOSIS — M6281 Muscle weakness (generalized): Secondary | ICD-10-CM | POA: Diagnosis not present

## 2023-04-11 DIAGNOSIS — M6281 Muscle weakness (generalized): Secondary | ICD-10-CM | POA: Diagnosis not present

## 2023-04-17 DIAGNOSIS — M6281 Muscle weakness (generalized): Secondary | ICD-10-CM | POA: Diagnosis not present

## 2023-04-20 ENCOUNTER — Encounter: Payer: Self-pay | Admitting: Orthopedic Surgery

## 2023-04-20 ENCOUNTER — Other Ambulatory Visit (INDEPENDENT_AMBULATORY_CARE_PROVIDER_SITE_OTHER): Payer: PPO

## 2023-04-20 ENCOUNTER — Ambulatory Visit: Payer: PPO | Admitting: Orthopedic Surgery

## 2023-04-20 DIAGNOSIS — M7742 Metatarsalgia, left foot: Secondary | ICD-10-CM

## 2023-04-20 DIAGNOSIS — M79672 Pain in left foot: Secondary | ICD-10-CM

## 2023-04-20 DIAGNOSIS — M6281 Muscle weakness (generalized): Secondary | ICD-10-CM | POA: Diagnosis not present

## 2023-04-20 NOTE — Progress Notes (Signed)
   There were no vitals taken for this visit.  There is no height or weight on file to calculate BMI.  Chief Complaint  Patient presents with   Ankle Pain    Patient was sent by Dr Renette Butters he has had xrays and a lesion was found on the left ankle  patient said it is better than it was but wrong touch its painful     Encounter Diagnoses  Name Primary?   Pain in left foot    Metatarsalgia, left foot Yes    DOI/DOS/ Date: 2 months   83 year old male sent by [place] [facility] to evaluate lesion of his left fibula which showed up on x-ray when he was being evaluated for pain in his left ankle and foot  Patient says his pain in the bottom of his feet under the metatarsal heads  He denies any trauma  Further evaluation of the imaging reports and films which I have personally reviewed show that there are no fractures noted in the ankle or the tibia he did not get an x-ray of his foot which today is where his pain is  On exam he has some hyperemia which is chronic he has some hammertoes and plantarflexion of the metatarsal heads he is tender over second third and fourth metatarsal phalangeal joints dorsally he has a prominent fifth metatarsal proximally with a callus there  He is nontender over the fibula or ankle  Imaging in house shows no fracture in the foot  DG Foot Complete Left Result Date: 04/20/2023 Images left foot Metatarsal pain X-rays show no evidence of fracture or dislocation There is some prominence of the metatarsal heads plantarward.  Osteopenia Impression normal foot with osteopenia     Encounter Diagnoses  Name Primary?   Pain in left foot    Metatarsalgia, left foot Yes    Recommend metatarsal pads.  The area in question over the fibula is not concerning for any lesion.  It is nontender.  Metatarsal pad should handle the issue

## 2023-04-20 NOTE — Progress Notes (Signed)
   There were no vitals taken for this visit.  There is no height or weight on file to calculate BMI.  Chief Complaint  Patient presents with   Ankle Pain    Patient was sent by Dr Renette Butters he has had xrays and a lesion was found on the left ankle  patient said it is better than it was but wrong touch its painful     No diagnosis found.  DOI/DOS/ Date: 2 months   Unchanged

## 2023-04-23 DIAGNOSIS — M6281 Muscle weakness (generalized): Secondary | ICD-10-CM | POA: Diagnosis not present

## 2023-04-25 DIAGNOSIS — M6281 Muscle weakness (generalized): Secondary | ICD-10-CM | POA: Diagnosis not present

## 2023-04-30 DIAGNOSIS — M6281 Muscle weakness (generalized): Secondary | ICD-10-CM | POA: Diagnosis not present

## 2023-05-01 DIAGNOSIS — M6281 Muscle weakness (generalized): Secondary | ICD-10-CM | POA: Diagnosis not present

## 2023-05-04 ENCOUNTER — Inpatient Hospital Stay: Payer: PPO

## 2023-05-08 ENCOUNTER — Inpatient Hospital Stay

## 2023-05-08 ENCOUNTER — Encounter (HOSPITAL_COMMUNITY): Payer: Self-pay | Admitting: Emergency Medicine

## 2023-05-08 ENCOUNTER — Other Ambulatory Visit: Payer: Self-pay

## 2023-05-08 ENCOUNTER — Observation Stay (HOSPITAL_COMMUNITY)
Admission: EM | Admit: 2023-05-08 | Discharge: 2023-05-09 | Disposition: A | Discharge status: Home / Self Care | Attending: Family Medicine | Admitting: Family Medicine

## 2023-05-08 ENCOUNTER — Emergency Department (HOSPITAL_COMMUNITY)

## 2023-05-08 DIAGNOSIS — J101 Influenza due to other identified influenza virus with other respiratory manifestations: Secondary | ICD-10-CM | POA: Diagnosis present

## 2023-05-08 DIAGNOSIS — Z20828 Contact with and (suspected) exposure to other viral communicable diseases: Secondary | ICD-10-CM | POA: Diagnosis not present

## 2023-05-08 DIAGNOSIS — Z96642 Presence of left artificial hip joint: Secondary | ICD-10-CM | POA: Insufficient documentation

## 2023-05-08 DIAGNOSIS — Z9981 Dependence on supplemental oxygen: Secondary | ICD-10-CM | POA: Diagnosis not present

## 2023-05-08 DIAGNOSIS — Z7951 Long term (current) use of inhaled steroids: Secondary | ICD-10-CM | POA: Insufficient documentation

## 2023-05-08 DIAGNOSIS — M169 Osteoarthritis of hip, unspecified: Secondary | ICD-10-CM | POA: Diagnosis not present

## 2023-05-08 DIAGNOSIS — J441 Chronic obstructive pulmonary disease with (acute) exacerbation: Principal | ICD-10-CM | POA: Insufficient documentation

## 2023-05-08 DIAGNOSIS — Z8589 Personal history of malignant neoplasm of other organs and systems: Secondary | ICD-10-CM | POA: Insufficient documentation

## 2023-05-08 DIAGNOSIS — I10 Essential (primary) hypertension: Secondary | ICD-10-CM | POA: Diagnosis not present

## 2023-05-08 DIAGNOSIS — R051 Acute cough: Secondary | ICD-10-CM | POA: Diagnosis not present

## 2023-05-08 DIAGNOSIS — J9611 Chronic respiratory failure with hypoxia: Secondary | ICD-10-CM

## 2023-05-08 DIAGNOSIS — Z87891 Personal history of nicotine dependence: Secondary | ICD-10-CM | POA: Diagnosis not present

## 2023-05-08 DIAGNOSIS — Z96652 Presence of left artificial knee joint: Secondary | ICD-10-CM | POA: Diagnosis not present

## 2023-05-08 DIAGNOSIS — Z6821 Body mass index (BMI) 21.0-21.9, adult: Secondary | ICD-10-CM | POA: Diagnosis not present

## 2023-05-08 DIAGNOSIS — H04129 Dry eye syndrome of unspecified lacrimal gland: Secondary | ICD-10-CM | POA: Diagnosis not present

## 2023-05-08 DIAGNOSIS — Z79899 Other long term (current) drug therapy: Secondary | ICD-10-CM | POA: Insufficient documentation

## 2023-05-08 DIAGNOSIS — R059 Cough, unspecified: Secondary | ICD-10-CM | POA: Diagnosis present

## 2023-05-08 DIAGNOSIS — J9601 Acute respiratory failure with hypoxia: Secondary | ICD-10-CM

## 2023-05-08 DIAGNOSIS — R0602 Shortness of breath: Secondary | ICD-10-CM | POA: Insufficient documentation

## 2023-05-08 DIAGNOSIS — C3411 Malignant neoplasm of upper lobe, right bronchus or lung: Secondary | ICD-10-CM | POA: Diagnosis not present

## 2023-05-08 DIAGNOSIS — H04123 Dry eye syndrome of bilateral lacrimal glands: Secondary | ICD-10-CM

## 2023-05-08 DIAGNOSIS — J9621 Acute and chronic respiratory failure with hypoxia: Secondary | ICD-10-CM | POA: Diagnosis not present

## 2023-05-08 LAB — LACTIC ACID, PLASMA: Lactic Acid, Venous: 0.9 mmol/L (ref 0.5–1.9)

## 2023-05-08 LAB — COMPREHENSIVE METABOLIC PANEL
ALT: 16 U/L (ref 0–44)
AST: 23 U/L (ref 15–41)
Albumin: 3.5 g/dL (ref 3.5–5.0)
Alkaline Phosphatase: 69 U/L (ref 38–126)
Anion gap: 10 (ref 5–15)
BUN: 22 mg/dL (ref 8–23)
CO2: 30 mmol/L (ref 22–32)
Calcium: 8.7 mg/dL — ABNORMAL LOW (ref 8.9–10.3)
Chloride: 100 mmol/L (ref 98–111)
Creatinine, Ser: 1.15 mg/dL (ref 0.61–1.24)
GFR, Estimated: >60 mL/min (ref >60)
Glucose, Bld: 108 mg/dL — ABNORMAL HIGH (ref 70–99)
Potassium: 3.9 mmol/L (ref 3.5–5.1)
Sodium: 140 mmol/L (ref 135–145)
Total Bilirubin: 0.5 mg/dL (ref 0.0–1.2)
Total Protein: 6.3 g/dL — ABNORMAL LOW (ref 6.5–8.1)

## 2023-05-08 LAB — CBC WITH DIFFERENTIAL/PLATELET
Abs Immature Granulocytes: 0.01 10*3/uL (ref 0.00–0.07)
Basophils Absolute: 0 10*3/uL (ref 0.0–0.1)
Basophils Relative: 0 %
Eosinophils Absolute: 0 10*3/uL (ref 0.0–0.5)
Eosinophils Relative: 1 %
HCT: 37 % — ABNORMAL LOW (ref 39.0–52.0)
Hemoglobin: 11.7 g/dL — ABNORMAL LOW (ref 13.0–17.0)
Immature Granulocytes: 0 %
Lymphocytes Relative: 18 %
Lymphs Abs: 0.5 10*3/uL — ABNORMAL LOW (ref 0.7–4.0)
MCH: 31.3 pg (ref 26.0–34.0)
MCHC: 31.6 g/dL (ref 30.0–36.0)
MCV: 98.9 fL (ref 80.0–100.0)
Monocytes Absolute: 0.4 10*3/uL (ref 0.1–1.0)
Monocytes Relative: 14 %
Neutro Abs: 1.9 10*3/uL (ref 1.7–7.7)
Neutrophils Relative %: 67 %
Platelets: 174 10*3/uL (ref 150–400)
RBC: 3.74 MIL/uL — ABNORMAL LOW (ref 4.22–5.81)
RDW: 14.5 % (ref 11.5–15.5)
WBC: 2.8 10*3/uL — ABNORMAL LOW (ref 4.0–10.5)
nRBC: 0 % (ref 0.0–0.2)

## 2023-05-08 LAB — BRAIN NATRIURETIC PEPTIDE: B Natriuretic Peptide: 89 pg/mL (ref 0.0–100.0)

## 2023-05-08 LAB — TROPONIN I (HIGH SENSITIVITY): Troponin I (High Sensitivity): 11 ng/L (ref <18)

## 2023-05-08 LAB — RESP PANEL BY RT-PCR (RSV, FLU A&B, COVID)  RVPGX2
Influenza A by PCR: POSITIVE — AB
Influenza B by PCR: NEGATIVE
Resp Syncytial Virus by PCR: NEGATIVE
SARS Coronavirus 2 by RT PCR: NEGATIVE

## 2023-05-08 MED ORDER — ACETAMINOPHEN 650 MG RE SUPP: 650.0000 mg | Freq: Four times a day (QID) | RECTAL | Status: DC | PRN

## 2023-05-08 MED ORDER — ALBUTEROL SULFATE (2.5 MG/3ML) 0.083% IN NEBU
INHALATION_SOLUTION | RESPIRATORY_TRACT | Status: AC
  Administered 2023-05-08: 10 mg
  Filled 2023-05-08: qty 12

## 2023-05-08 MED ORDER — FLUTICASONE FUROATE-VILANTEROL 200-25 MCG/ACT IN AEPB
1.0000 | INHALATION_SPRAY | Freq: Every day | RESPIRATORY_TRACT | Status: DC
  Administered 2023-05-09: 1 via RESPIRATORY_TRACT
  Filled 2023-05-08: qty 28

## 2023-05-08 MED ORDER — METHYLPREDNISOLONE SODIUM SUCC 125 MG IJ SOLR
125.0000 mg | Freq: Once | INTRAMUSCULAR | Status: AC
Start: 2023-05-08 — End: 2023-05-08
  Administered 2023-05-08: 125 mg via INTRAVENOUS
  Filled 2023-05-08: qty 2

## 2023-05-08 MED ORDER — ALBUTEROL SULFATE (2.5 MG/3ML) 0.083% IN NEBU: 3.0000 mL | INHALATION_SOLUTION | Freq: Once | RESPIRATORY_TRACT | Status: DC

## 2023-05-08 MED ORDER — IPRATROPIUM-ALBUTEROL 0.5-2.5 (3) MG/3ML IN SOLN
3.0000 mL | RESPIRATORY_TRACT | Status: DC
  Administered 2023-05-08: 3 mL via RESPIRATORY_TRACT
  Filled 2023-05-08 (×2): qty 3

## 2023-05-08 MED ORDER — UMECLIDINIUM BROMIDE 62.5 MCG/ACT IN AEPB
1.0000 | INHALATION_SPRAY | Freq: Every day | RESPIRATORY_TRACT | Status: DC
  Administered 2023-05-09: 1 via RESPIRATORY_TRACT
  Filled 2023-05-08: qty 7

## 2023-05-08 MED ORDER — PSYLLIUM 95 % PO PACK
1.0000 | PACK | Freq: Two times a day (BID) | ORAL | Status: DC
  Administered 2023-05-08 – 2023-05-09 (×2): 1 via ORAL
  Filled 2023-05-08 (×2): qty 1

## 2023-05-08 MED ORDER — DICLOFENAC SODIUM 1 % EX GEL
2.0000 g | Freq: Four times a day (QID) | CUTANEOUS | Status: DC
  Administered 2023-05-08 – 2023-05-09 (×2): 2 g via TOPICAL
  Filled 2023-05-08 (×2): qty 100

## 2023-05-08 MED ORDER — MEMANTINE HCL 10 MG PO TABS
5.0000 mg | ORAL_TABLET | Freq: Every day | ORAL | Status: DC
  Administered 2023-05-08: 5 mg via ORAL
  Filled 2023-05-08: qty 1

## 2023-05-08 MED ORDER — PANTOPRAZOLE SODIUM 20 MG PO TBEC
20.0000 mg | DELAYED_RELEASE_TABLET | Freq: Every day | ORAL | Status: DC
  Filled 2023-05-08 (×2): qty 1

## 2023-05-08 MED ORDER — VITAMIN D 25 MCG (1000 UNIT) PO TABS
1000.0000 [IU] | ORAL_TABLET | Freq: Every day | ORAL | Status: DC
  Administered 2023-05-08 – 2023-05-09 (×2): 1000 [IU] via ORAL
  Filled 2023-05-08 (×2): qty 1

## 2023-05-08 MED ORDER — CYCLOSPORINE 0.05 % OP EMUL
1.0000 [drp] | Freq: Two times a day (BID) | OPHTHALMIC | Status: DC
  Administered 2023-05-08 – 2023-05-09 (×2): 1 [drp] via OPHTHALMIC
  Filled 2023-05-08 (×2): qty 30

## 2023-05-08 MED ORDER — METHYLPREDNISOLONE SODIUM SUCC 125 MG IJ SOLR
60.0000 mg | Freq: Every day | INTRAMUSCULAR | Status: DC
  Administered 2023-05-09: 60 mg via INTRAVENOUS
  Filled 2023-05-08: qty 2

## 2023-05-08 MED ORDER — PANTOPRAZOLE SODIUM 40 MG IV SOLR
20.0000 mg | Freq: Every day | INTRAVENOUS | Status: DC
  Administered 2023-05-08: 20 mg via INTRAVENOUS
  Filled 2023-05-08 (×2): qty 10

## 2023-05-08 MED ORDER — ROSUVASTATIN CALCIUM 10 MG PO TABS
10.0000 mg | ORAL_TABLET | Freq: Every day | ORAL | Status: DC
  Administered 2023-05-08 – 2023-05-09 (×2): 10 mg via ORAL
  Filled 2023-05-08 (×2): qty 1

## 2023-05-08 MED ORDER — DONEPEZIL HCL 5 MG PO TABS
10.0000 mg | ORAL_TABLET | Freq: Every day | ORAL | Status: DC
  Administered 2023-05-08: 10 mg via ORAL
  Filled 2023-05-08: qty 2

## 2023-05-08 MED ORDER — BENZONATATE 100 MG PO CAPS
200.0000 mg | ORAL_CAPSULE | Freq: Two times a day (BID) | ORAL | Status: DC | PRN
  Administered 2023-05-08: 200 mg via ORAL
  Filled 2023-05-08: qty 2

## 2023-05-08 MED ORDER — ALBUTEROL SULFATE HFA 108 (90 BASE) MCG/ACT IN AERS: 2.0000 | INHALATION_SPRAY | RESPIRATORY_TRACT | Status: DC | PRN

## 2023-05-08 MED ORDER — ENOXAPARIN SODIUM 40 MG/0.4ML IJ SOSY
40.0000 mg | PREFILLED_SYRINGE | INTRAMUSCULAR | Status: DC
  Administered 2023-05-09: 40 mg via SUBCUTANEOUS
  Filled 2023-05-08: qty 0.4

## 2023-05-08 MED ORDER — DOCUSATE SODIUM 100 MG PO CAPS
100.0000 mg | ORAL_CAPSULE | Freq: Two times a day (BID) | ORAL | Status: DC
  Administered 2023-05-08 – 2023-05-09 (×2): 100 mg via ORAL
  Filled 2023-05-08 (×2): qty 1

## 2023-05-08 MED ORDER — SODIUM CHLORIDE 0.9% FLUSH
3.0000 mL | Freq: Two times a day (BID) | INTRAVENOUS | Status: DC
  Administered 2023-05-08 – 2023-05-09 (×2): 3 mL via INTRAVENOUS

## 2023-05-08 MED ORDER — LEVOTHYROXINE SODIUM 75 MCG PO TABS
150.0000 ug | ORAL_TABLET | Freq: Every day | ORAL | Status: DC
  Administered 2023-05-09: 150 ug via ORAL
  Filled 2023-05-08: qty 2

## 2023-05-08 MED ORDER — MELATONIN 5 MG PO TABS
5.0000 mg | ORAL_TABLET | Freq: Every day | ORAL | Status: DC
  Filled 2023-05-08: qty 1

## 2023-05-08 MED ORDER — ACETAMINOPHEN 325 MG PO TABS: 650.0000 mg | ORAL_TABLET | Freq: Four times a day (QID) | ORAL | Status: DC | PRN

## 2023-05-08 MED ORDER — MELATONIN 3 MG PO TABS
6.0000 mg | ORAL_TABLET | Freq: Every day | ORAL | Status: DC
  Administered 2023-05-08: 6 mg via ORAL
  Filled 2023-05-08: qty 2

## 2023-05-08 MED ORDER — POLYETHYLENE GLYCOL 3350 17 G PO PACK: 17.0000 g | PACK | Freq: Every day | ORAL | Status: DC | PRN

## 2023-05-08 MED ORDER — OSELTAMIVIR PHOSPHATE 75 MG PO CAPS
75.0000 mg | ORAL_CAPSULE | Freq: Two times a day (BID) | ORAL | Status: DC
  Administered 2023-05-08 – 2023-05-09 (×2): 75 mg via ORAL
  Filled 2023-05-08 (×3): qty 1

## 2023-05-08 NOTE — ED Triage Notes (Signed)
 Pt went to [facility] this morning for chest tightness and shortness of breath. Pt reports he uses 2L at all times due to COPD. Pt tested positive for Flu at [facility]. PT seems confused in triage.

## 2023-05-08 NOTE — ED Notes (Signed)
 Patient ambulated 19ft, on 2L oxygen via Nasal cannula. Oxygen sat started at 92 while standing but then dropped to 79%. Upon returning to patient room patient complained of feeling tired and winded. While in bed O2 100%

## 2023-05-08 NOTE — ED Notes (Signed)
 Patient uses 2L of oxygen at home, Baseline.

## 2023-05-08 NOTE — ED Notes (Signed)
 ED TO INPATIENT HANDOFF REPORT  ED Nurse Name and Phone #: Ephriam Knuckles Medic   S Name/Age/Gender Ian Aguilar 83 y.o. male Room/Bed: APA03/APA03  Code Status   Code Status: Do not attempt resuscitation (DNR) PRE-ARREST INTERVENTIONS DESIRED  Home/SNF/Other Nursing Home Patient oriented to: self and situation Is this baseline? Yes   Triage Complete: Triage complete  Chief Complaint COPD exacerbation (HCC) [J44.1]  Triage Note Pt went to [facility] this morning for chest tightness and shortness of breath. Pt reports he uses 2L at all times due to COPD. Pt tested positive for Flu at [facility]. PT seems confused in triage.    Allergies No Known Allergies  Level of Care/Admitting Diagnosis ED Disposition     ED Disposition  Admit   Condition  --   Comment  Hospital Area: [facility] [[id number]]  Level of Care: Med-Surg [16]  Covid Evaluation: Asymptomatic - no recent exposure (last 10 days) testing not required  Diagnosis: COPD exacerbation [facility]) [[id number]]  Admitting Physician: Nolberto Hanlon [[id number]]  Attending Physician: Nolberto Hanlon [[id number]]  Certification:: I certify this patient will need inpatient services for at least 2 midnights  Expected Medical Readiness: 05/10/2023          B Medical/Surgery History Past Medical History:  Diagnosis Date   Anxiety    Arthritis    Cancer (HCC)    Cirrhosis (HCC)    confirmed by MRI on 07/04/11.     COPD (chronic obstructive pulmonary disease) (HCC)    Depression with anxiety    ETOH abuse    Hyperlipidemia    Hypertension    diet control   Nodule of upper lobe of right lung    with mediastinal adenopathy   Wears dentures    Wears glasses    Past Surgical History:  Procedure Laterality Date   bilateral cataract surgery     [place]   broken arm  6 yrs ago   left otif of wrist-[place]   CLOSED REDUCTION WRIST FRACTURE Right 09/02/2015   Procedure: RIGHT WRIST REDUCTION;  Surgeon: Sheral Apley, MD;   Location: [facility] OR;  Service: Orthopedics;  Laterality: Right;  with MAC   COLONOSCOPY  1996   [facility]: external hemorrhoids, no polyps   COLONOSCOPY  06/28/2011   Dr. Claudius Sis diverticulosis,tubular adenoma, hyperplastic polyp   COLONOSCOPY WITH PROPOFOL N/A 04/26/2017   Dr. Jena Gauss: perianal and digital rectal examinations normal, diffusely congested colonic mucosa but otherwise normal   ESOPHAGOGASTRODUODENOSCOPY  06/28/2011   Dr. Dellie Catholic erosive reflux esophagitis, hiatal hernia-gastritis   ESOPHAGOGASTRODUODENOSCOPY (EGD) WITH PROPOFOL N/A 04/26/2017   Dr. Jena Gauss: normal esophagus, small hiatal hernia, GAVE, portal hypertensive gastropathy, normal duodenal bulb and second portion, no specimens collected. 2 years screening    ESOPHAGOGASTRODUODENOSCOPY (EGD) WITH PROPOFOL N/A 06/19/2019   normal esophagus, portal gastropathy, GAVE, normal duodenum. 2 years screening.    EXTERNAL EAR SURGERY     right ear-cleaned out ear and created new eardrum   INGUINAL HERNIA REPAIR  2-3 yrs ago   left-[facility]   INTRAMEDULLARY (IM) NAIL INTERTROCHANTERIC Right 09/02/2015   Procedure: RIGHT  INTERTROCHANTRIC HIP;  Surgeon: Sheral Apley, MD;  Location: [facility] OR;  Service: Orthopedics;  Laterality: Right;  With MAC   KNEE SURGERY     left-arthroscopy-[place]   PORTACATH PLACEMENT Left 06/08/2017   Procedure: INSERTION POWER PORT WITH  ATTACHED CATHETER LEFT SUBCLAVIAN;  Surgeon: Franky Macho, MD;  Location: [facility];  Service: General;  Laterality: Left;   SKULL FRACTURE ELEVATION  fractured cheeck bone repaired   TOTAL HIP ARTHROPLASTY  12/18/2011   Procedure: TOTAL HIP ARTHROPLASTY;  Surgeon: Vickki Hearing, MD;  Location: [facility];  Service: Orthopedics;  Laterality: Left;   VIDEO BRONCHOSCOPY WITH ENDOBRONCHIAL ULTRASOUND N/A 05/04/2017   Procedure: VIDEO BRONCHOSCOPY WITH ENDOBRONCHIAL ULTRASOUND;  Surgeon: Loreli Slot, MD;  Location: [facility] OR;  Service: Thoracic;  Laterality: N/A;    WISDOM TOOTH EXTRACTION       A IV Location/Drains/Wounds Patient Lines/Drains/Airways Status     Active Line/Drains/Airways     Name Placement date Placement time Site Days   Implanted Port 06/08/17 Left Chest 06/08/17  0749  Chest  2160   Peripheral IV 05/08/23 20 G 1" Anterior;Right Forearm 05/08/23  1511  Forearm  less than 1            Intake/Output Last 24 hours No intake or output data in the 24 hours ending 05/08/23 1946  Labs/Imaging Results for orders placed or performed during the hospital encounter of 05/08/23 (from the past 48 hours)  Resp panel by RT-PCR (RSV, Flu A&B, Covid) Anterior Nasal Swab     Status: Abnormal   Collection Time: 05/08/23  2:02 PM   Specimen: Anterior Nasal Swab  Result Value Ref Range   SARS Coronavirus 2 by RT PCR NEGATIVE NEGATIVE    Comment: (NOTE) SARS-CoV-2 target nucleic acids are NOT DETECTED.  The SARS-CoV-2 RNA is generally detectable in upper respiratory specimens during the acute phase of infection. The lowest concentration of SARS-CoV-2 viral copies this assay can detect is 138 copies/mL. A negative result does not preclude SARS-Cov-2 infection and should not be used as the sole basis for treatment or other patient management decisions. A negative result may occur with  improper specimen collection/handling, submission of specimen other than nasopharyngeal swab, presence of viral mutation(s) within the areas targeted by this assay, and inadequate number of viral copies(<138 copies/mL). A negative result must be combined with clinical observations, patient history, and epidemiological information. The expected result is Negative.  Fact Sheet for Patients:  BloggerCourse.com  Fact Sheet for Healthcare Providers:  SeriousBroker.it  This test is no t yet approved or cleared by the [place] FDA and  has been authorized for detection and/or diagnosis of SARS-CoV-2  by FDA under an Emergency Use Authorization (EUA). This EUA will remain  in effect (meaning this test can be used) for the duration of the COVID-19 declaration under Section 564(b)(1) of the Act, 21 U.S.C.section 360bbb-3(b)(1), unless the authorization is terminated  or revoked sooner.       Influenza A by PCR POSITIVE (A) NEGATIVE   Influenza B by PCR NEGATIVE NEGATIVE    Comment: (NOTE) The Xpert Xpress SARS-CoV-2/FLU/RSV plus assay is intended as an aid in the diagnosis of influenza from Nasopharyngeal swab specimens and should not be used as a sole basis for treatment. Nasal washings and aspirates are unacceptable for Xpert Xpress SARS-CoV-2/FLU/RSV testing.  Fact Sheet for Patients: BloggerCourse.com  Fact Sheet for Healthcare Providers: SeriousBroker.it  This test is not yet approved or cleared by the [place] FDA and has been authorized for detection and/or diagnosis of SARS-CoV-2 by FDA under an Emergency Use Authorization (EUA). This EUA will remain in effect (meaning this test can be used) for the duration of the COVID-19 declaration under Section 564(b)(1) of the Act, 21 U.S.C. section 360bbb-3(b)(1), unless the authorization is terminated or revoked.     Resp Syncytial Virus by PCR NEGATIVE NEGATIVE    Comment: (  NOTE) Fact Sheet for Patients: BloggerCourse.com  Fact Sheet for Healthcare Providers: SeriousBroker.it  This test is not yet approved or cleared by the [place] FDA and has been authorized for detection and/or diagnosis of SARS-CoV-2 by FDA under an Emergency Use Authorization (EUA). This EUA will remain in effect (meaning this test can be used) for the duration of the COVID-19 declaration under Section 564(b)(1) of the Act, 21 U.S.C. section 360bbb-3(b)(1), unless the authorization is terminated or revoked.  Performed at [facility], [address], [place], [place] [address]   Brain natriuretic peptide     Status: None   Collection Time: 05/08/23  3:23 PM  Result Value Ref Range   B Natriuretic Peptide 89.0 0.0 - 100.0 pg/mL    Comment: Performed at [facility], [address], [place], [place] [address]  Comprehensive metabolic panel     Status: Abnormal   Collection Time: 05/08/23  3:23 PM  Result Value Ref Range   Sodium 140 135 - 145 mmol/L   Potassium 3.9 3.5 - 5.1 mmol/L   Chloride 100 98 - 111 mmol/L   CO2 30 22 - 32 mmol/L   Glucose, Bld 108 (H) 70 - 99 mg/dL    Comment: Glucose reference range applies only to samples taken after fasting for at least 8 hours.   BUN 22 8 - 23 mg/dL   Creatinine, Ser 0.98 0.61 - 1.24 mg/dL   Calcium 8.7 (L) 8.9 - 10.3 mg/dL   Total Protein 6.3 (L) 6.5 - 8.1 g/dL   Albumin 3.5 3.5 - 5.0 g/dL   AST 23 15 - 41 U/L   ALT 16 0 - 44 U/L   Alkaline Phosphatase 69 38 - 126 U/L   Total Bilirubin 0.5 0.0 - 1.2 mg/dL   GFR, Estimated >11 >91 mL/min    Comment: (NOTE) Calculated using the CKD-EPI Creatinine Equation (2021)    Anion gap 10 5 - 15    Comment: Performed at [facility], [address], [place], [place] [address]  Lactic acid, plasma     Status: None   Collection Time: 05/08/23  3:23 PM  Result Value Ref Range   Lactic Acid, Venous 0.9 0.5 - 1.9 mmol/L    Comment: Performed at [facility], [address] [place], [place] [address]  Troponin I (High Sensitivity)     Status: None   Collection Time: 05/08/23  3:23 PM  Result Value Ref Range   Troponin I (High Sensitivity) 11 <18 ng/L    Comment: (NOTE) Elevated high sensitivity troponin I (hsTnI) values and significant  changes across serial measurements may suggest ACS but many other  chronic and acute conditions are known to elevate hsTnI results.  Refer to the "Links" section for chest pain algorithms and additional  guidance. Performed at [facility], [address] [place], [place] [address]   CBC with  Differential     Status: Abnormal   Collection Time: 05/08/23  3:23 PM  Result Value Ref Range   WBC 2.8 (L) 4.0 - 10.5 K/uL   RBC 3.74 (L) 4.22 - 5.81 MIL/uL   Hemoglobin 11.7 (L) 13.0 - 17.0 g/dL   HCT 84.6 (L) 96.2 - 95.2 %   MCV 98.9 80.0 - 100.0 fL   MCH 31.3 26.0 - 34.0 pg   MCHC 31.6 30.0 - 36.0 g/dL   RDW 84.1 32.4 - 40.1 %   Platelets 174 150 - 400 K/uL   nRBC 0.0 0.0 - 0.2 %  Neutrophils Relative % 67 %   Neutro Abs 1.9 1.7 - 7.7 K/uL   Lymphocytes Relative 18 %   Lymphs Abs 0.5 (L) 0.7 - 4.0 K/uL   Monocytes Relative 14 %   Monocytes Absolute 0.4 0.1 - 1.0 K/uL   Eosinophils Relative 1 %   Eosinophils Absolute 0.0 0.0 - 0.5 K/uL   Basophils Relative 0 %   Basophils Absolute 0.0 0.0 - 0.1 K/uL   Immature Granulocytes 0 %   Abs Immature Granulocytes 0.01 0.00 - 0.07 K/uL    Comment: Performed at [facility], [address] [place], [place] [address]   DG Chest 2 View Result Date: 05/08/2023 CLINICAL DATA:  Shortness of breath EXAM: CHEST - 2 VIEW COMPARISON:  03/02/2023, chest CT 01/08/2023 FINDINGS: Left-sided central venous port tip over the SVC. Stable right upper paramediastinal/medial right upper lobe opacity, corresponding to post therapeutic changes on CT imaging. No acute airspace disease or effusion. Normal cardiac size with aortic atherosclerosis. Chronic compression deformity at the upper lumbar spine. IMPRESSION: No active cardiopulmonary disease. Electronically Signed   By: Jasmine Pang M.D.   On: 05/08/2023 16:40    Pending Labs Unresulted Labs (From admission, onward)     Start     Ordered   05/15/23 0500  Creatinine, serum  (enoxaparin (LOVENOX)    CrCl >/= 30 ml/min)  Weekly,   R     Comments: while on enoxaparin therapy    05/08/23 1931   05/09/23 0500  APTT  Tomorrow morning,   R        05/08/23 1931   05/09/23 0500  Protime-INR  Tomorrow morning,   R        05/08/23 1931   05/09/23 0500  Basic metabolic panel  Tomorrow morning,   R         05/08/23 1931   05/09/23 0500  CBC  Tomorrow morning,   R        05/08/23 1931   05/08/23 1438  Culture, blood (routine x 2)  BLOOD CULTURE X 2,   R (with STAT occurrences)      05/08/23 1437            Vitals/Pain Today's Vitals   05/08/23 1645 05/08/23 1700 05/08/23 1715 05/08/23 1800  BP: (!) 132/93 (!) 153/58 (!) 154/60   Pulse:  93 (!) 107 80  Resp: 18 18 18 19   Temp:      TempSrc:      SpO2:  100% 99% 98%  Weight:      Height:      PainSc:        Isolation Precautions No active isolations  Medications Medications  albuterol (VENTOLIN HFA) 108 (90 Base) MCG/ACT inhaler 2 puff (has no administration in time range)  albuterol (PROVENTIL) (2.5 MG/3ML) 0.083% nebulizer solution 3 mL (10 mg/hr Nebulization Not Given 05/08/23 1511)  oseltamivir (TAMIFLU) capsule 75 mg (has no administration in time range)  ipratropium-albuterol (DUONEB) 0.5-2.5 (3) MG/3ML nebulizer solution 3 mL (has no administration in time range)  methylPREDNISolone sodium succinate (SOLU-MEDROL) 125 mg/2 mL injection 60 mg (has no administration in time range)  pantoprazole (PROTONIX) EC tablet 20 mg (has no administration in time range)  fluticasone furoate-vilanterol (BREO ELLIPTA) 200-25 MCG/ACT 1 puff (has no administration in time range)    And  umeclidinium bromide (INCRUSE ELLIPTA) 62.5 MCG/ACT 1 puff (has no administration in time range)  rosuvastatin (CRESTOR) tablet 10 mg (has no administration in time range)  benzonatate (  TESSALON) capsule 200 mg (has no administration in time range)  diclofenac Sodium (VOLTAREN) 1 % topical gel 2 g (has no administration in time range)  docusate sodium (COLACE) capsule 100 mg (has no administration in time range)  psyllium (HYDROCIL/METAMUCIL) 1 packet (has no administration in time range)  melatonin tablet 5 mg (has no administration in time range)  donepezil (ARICEPT) tablet 10 mg (has no administration in time range)  memantine (NAMENDA) tablet 5 mg  (has no administration in time range)  cycloSPORINE (RESTASIS) 0.05 % ophthalmic emulsion 1 drop (has no administration in time range)  levothyroxine (SYNTHROID) tablet 150 mcg (has no administration in time range)  cholecalciferol (VITAMIN D3) 25 MCG (1000 UNIT) tablet 1,000 Units (has no administration in time range)  enoxaparin (LOVENOX) injection 40 mg (has no administration in time range)  acetaminophen (TYLENOL) tablet 650 mg (has no administration in time range)    Or  acetaminophen (TYLENOL) suppository 650 mg (has no administration in time range)  polyethylene glycol (MIRALAX / GLYCOLAX) packet 17 g (has no administration in time range)  sodium chloride flush (NS) 0.9 % injection 3 mL (has no administration in time range)  methylPREDNISolone sodium succinate (SOLU-MEDROL) 125 mg/2 mL injection 125 mg (125 mg Intravenous Given 05/08/23 1559)  albuterol (PROVENTIL) (2.5 MG/3ML) 0.083% nebulizer solution (10 mg  Given 05/08/23 1510)    Mobility walks with device     Focused Assessments    R Recommendations: See Admitting Provider Note  Report given to: Rodman Pickle   Additional Notes:

## 2023-05-08 NOTE — H&P (Signed)
 History and Physical    Patient: Ian Aguilar ZOX:[id number] DOB: 1941-02-15 DOA: 05/08/2023 DOS: the patient was seen and examined on 05/08/2023 PCP: Assunta Found, MD  Patient coming from: Home  Chief Complaint:  Chief Complaint  Patient presents with   flu like synptoms   Shortness of Breath   HPI: Ian Aguilar is a 83 y.o. male with medical history significant of known COPD.  Patient uses 2 L/min of supplementary oxygen at home.  Patient was in his usual state of health till about 2 days ago when he reports a new onset of cough with some expectoration yellow-colored.  Associated with worsening sensation of shortness of breath with ambulation.  Patient denies any fever chest pain leg swelling palpitation or loss of consciousness.  Patient came to the ER today because of worsening sensation of shortness of breath.  Here in the ER, patient is status post Solu-Medrol and DuoNeb for finding of wheezing on physical exam.  Chest x-ray reportedly normal.  However on ambulating the patient patient SpO2 dipped down to 79% on 2 L/min of supplementary oxygen and patient was noted to be in respiratory distress.  Medical evaluation is sought.  Patient influenza positive.  Patient at this time is back in the stretcher with the oxygen and is completely asymptomatic. Review of Systems: As mentioned in the history of present illness. All other systems reviewed and are negative. Past Medical History:  Diagnosis Date   Anxiety    Arthritis    Cancer (HCC)    Cirrhosis (HCC)    confirmed by MRI on 07/04/11.     COPD (chronic obstructive pulmonary disease) (HCC)    Depression with anxiety    ETOH abuse    Hyperlipidemia    Hypertension    diet control   Nodule of upper lobe of right lung    with mediastinal adenopathy   Wears dentures    Wears glasses    Past Surgical History:  Procedure Laterality Date   bilateral cataract surgery     [place]   broken arm  6 yrs ago   left otif of  wrist-[place]   CLOSED REDUCTION WRIST FRACTURE Right 09/02/2015   Procedure: RIGHT WRIST REDUCTION;  Surgeon: Sheral Apley, MD;  Location: [facility] OR;  Service: Orthopedics;  Laterality: Right;  with MAC   COLONOSCOPY  1996   [place]: external hemorrhoids, no polyps   COLONOSCOPY  06/28/2011   Dr. Claudius Sis diverticulosis,tubular adenoma, hyperplastic polyp   COLONOSCOPY WITH PROPOFOL N/A 04/26/2017   Dr. Jena Gauss: perianal and digital rectal examinations normal, diffusely congested colonic mucosa but otherwise normal   ESOPHAGOGASTRODUODENOSCOPY  06/28/2011   Dr. Dellie Catholic erosive reflux esophagitis, hiatal hernia-gastritis   ESOPHAGOGASTRODUODENOSCOPY (EGD) WITH PROPOFOL N/A 04/26/2017   Dr. Jena Gauss: normal esophagus, small hiatal hernia, GAVE, portal hypertensive gastropathy, normal duodenal bulb and second portion, no specimens collected. 2 years screening    ESOPHAGOGASTRODUODENOSCOPY (EGD) WITH PROPOFOL N/A 06/19/2019   normal esophagus, portal gastropathy, GAVE, normal duodenum. 2 years screening.    EXTERNAL EAR SURGERY     right ear-cleaned out ear and created new eardrum   INGUINAL HERNIA REPAIR  2-3 yrs ago   left-[place]_   INTRAMEDULLARY (IM) NAIL INTERTROCHANTERIC Right 09/02/2015   Procedure: RIGHT  INTERTROCHANTRIC HIP;  Surgeon: Sheral Apley, MD;  Location: [facility] OR;  Service: Orthopedics;  Laterality: Right;  With MAC   KNEE SURGERY     left-arthroscopy-[place]   PORTACATH PLACEMENT Left 06/08/2017   Procedure: INSERTION POWER  PORT WITH  ATTACHED CATHETER LEFT SUBCLAVIAN;  Surgeon: Franky Macho, MD;  Location: [facility];  Service: General;  Laterality: Left;   SKULL FRACTURE ELEVATION     fractured cheeck bone repaired   TOTAL HIP ARTHROPLASTY  12/18/2011   Procedure: TOTAL HIP ARTHROPLASTY;  Surgeon: Vickki Hearing, MD;  Location: [facility];  Service: Orthopedics;  Laterality: Left;   VIDEO BRONCHOSCOPY WITH ENDOBRONCHIAL ULTRASOUND N/A 05/04/2017   Procedure: VIDEO  BRONCHOSCOPY WITH ENDOBRONCHIAL ULTRASOUND;  Surgeon: Loreli Slot, MD;  Location: [facility] OR;  Service: Thoracic;  Laterality: N/A;   WISDOM TOOTH EXTRACTION     Social History:  reports that he quit smoking about 66 years ago. His smoking use included cigarettes. He has never used smokeless tobacco. He reports that he does not currently use alcohol after a past usage of about 2.0 standard drinks of alcohol per week. He reports that he does not use drugs.  No Known Allergies  Family History  Problem Relation Age of Onset   Stroke Mother 9   Heart disease Father 2       MI   Diabetes Maternal Uncle    Colon cancer Neg Hx    Cancer Neg Hx     Prior to Admission medications   Medication Sig Start Date End Date Taking? Authorizing Provider  acetaminophen (TYLENOL) 325 MG tablet Take by mouth. 12/28/22  Yes [provider]  albuterol (VENTOLIN HFA) 108 (90 Base) MCG/ACT inhaler Inhale 90 mcg into the lungs 4 (four) times daily as needed. 06/16/21  Yes [provider]  Aloe Vera GEL Apply topically.   Yes [provider]  benzonatate (TESSALON) 200 MG capsule Take 200 mg by mouth 2 (two) times daily as needed for cough. 08/17/22  Yes [provider]  cholecalciferol (VITAMIN D) 1000 units tablet Take 1,000 Units by mouth daily.   Yes [provider]  cycloSPORINE (RESTASIS) 0.05 % ophthalmic emulsion Place 1 drop into both eyes 2 (two) times daily. 08/18/22  Yes [provider]  desonide (DESOWEN) 0.05 % cream Apply topically. 01/16/23  Yes [provider]  docusate sodium (COLACE) 100 MG capsule Take 100 mg by mouth 2 (two) times daily.   Yes [provider]  donepezil (ARICEPT) 10 MG tablet TAKE 1 TABLET BY MOUTH AT BEDTIME. 02/15/23  Yes Gwynneth Munson, Sung Amabile, PA-C  ferrous sulfate 325 (65 FE) MG EC tablet Take 325 mg by mouth daily.   Yes [provider]  GOODSENSE ARTHRITIS PAIN 1 % GEL Apply 2 g topically 4  (four) times daily. 05/07/23  Yes [provider]  GOODSENSE ARTIFICIAL TEARS 0.5-0.6 % SOLN Apply to eye. 05/24/20  Yes [provider]  levothyroxine (SYNTHROID) 150 MCG tablet Take 150 mcg by mouth daily before breakfast.   Yes [provider]  loperamide (IMODIUM) 2 MG capsule Take by mouth as needed for diarrhea or loose stools.   Yes [provider]  melatonin 5 MG TABS Take 5 mg by mouth at bedtime.   Yes [provider]  MUCUS RELIEF 600 MG 12 hr tablet Take 600 mg by mouth 2 (two) times daily. 04/24/23  Yes [provider]  mupirocin ointment (BACTROBAN) 2 % 1 Application 3 (three) times daily.   Yes [provider]  OXYGEN Inhale 2 L into the lungs continuous. 04/24/20  Yes [provider]  Psyllium (METAMUCIL PO) Take by mouth. 1 capsule twice daily   Yes [provider]  rosuvastatin (CRESTOR)  10 MG tablet Take 1 tablet (10 mg total) by mouth daily. 05/21/20  Yes Sharee Holster, NP  TRELEGY ELLIPTA 200-62.5-25 MCG/ACT AEPB Inhale 1 puff into the lungs daily. 09/15/22  Yes [provider]  vitamin B-12 (CYANOCOBALAMIN) 1000 MCG tablet Take 1 tablet (1,000 mcg total) by mouth daily. 02/10/21  Yes Doreatha Massed, MD  memantine (NAMENDA) 5 MG tablet Take 1 tablet (5 mg at night) for 2 weeks, then increase to 1 tablet (5 mg) twice a day 02/15/23   Marcos Eke, PA-C    Physical Exam: Vitals:   05/08/23 1645 05/08/23 1700 05/08/23 1715 05/08/23 1800  BP: (!) 132/93 (!) 153/58 (!) 154/60   Pulse:  93 (!) 107 80  Resp: 18 18 18 19   Temp:      TempSrc:      SpO2:  100% 99% 98%  Weight:      Height:       General: Patient is slightly hard of hearing.  Otherwise alert awake and fully oriented. Appears to be in no distress Respiratory exam: Diffuse diminished breath sounds with expiratory wheezes.  Patient on 2 L/min of supplementary oxygen Cardiovascular exam S1-S2 normal Abdomen all  quadrants are soft nontender Extremities warm without edema. Data Reviewed:  Labs on Admission:  Results for orders placed or performed during the hospital encounter of 05/08/23 (from the past 24 hours)  Resp panel by RT-PCR (RSV, Flu A&B, Covid) Anterior Nasal Swab     Status: Abnormal   Collection Time: 05/08/23  2:02 PM   Specimen: Anterior Nasal Swab  Result Value Ref Range   SARS Coronavirus 2 by RT PCR NEGATIVE NEGATIVE   Influenza A by PCR POSITIVE (A) NEGATIVE   Influenza B by PCR NEGATIVE NEGATIVE   Resp Syncytial Virus by PCR NEGATIVE NEGATIVE  Brain natriuretic peptide     Status: None   Collection Time: 05/08/23  3:23 PM  Result Value Ref Range   B Natriuretic Peptide 89.0 0.0 - 100.0 pg/mL  Comprehensive metabolic panel     Status: Abnormal   Collection Time: 05/08/23  3:23 PM  Result Value Ref Range   Sodium 140 135 - 145 mmol/L   Potassium 3.9 3.5 - 5.1 mmol/L   Chloride 100 98 - 111 mmol/L   CO2 30 22 - 32 mmol/L   Glucose, Bld 108 (H) 70 - 99 mg/dL   BUN 22 8 - 23 mg/dL   Creatinine, Ser 1.61 0.61 - 1.24 mg/dL   Calcium 8.7 (L) 8.9 - 10.3 mg/dL   Total Protein 6.3 (L) 6.5 - 8.1 g/dL   Albumin 3.5 3.5 - 5.0 g/dL   AST 23 15 - 41 U/L   ALT 16 0 - 44 U/L   Alkaline Phosphatase 69 38 - 126 U/L   Total Bilirubin 0.5 0.0 - 1.2 mg/dL   GFR, Estimated >09 >60 mL/min   Anion gap 10 5 - 15  Lactic acid, plasma     Status: None   Collection Time: 05/08/23  3:23 PM  Result Value Ref Range   Lactic Acid, Venous 0.9 0.5 - 1.9 mmol/L  Troponin I (High Sensitivity)     Status: None   Collection Time: 05/08/23  3:23 PM  Result Value Ref Range   Troponin I (High Sensitivity) 11 <18 ng/L  CBC with Differential     Status: Abnormal   Collection Time: 05/08/23  3:23 PM  Result Value Ref Range   WBC 2.8 (L) 4.0 - 10.5  K/uL   RBC 3.74 (L) 4.22 - 5.81 MIL/uL   Hemoglobin 11.7 (L) 13.0 - 17.0 g/dL   HCT 82.9 (L) 56.2 - 13.0 %   MCV 98.9 80.0 - 100.0 fL   MCH 31.3 26.0 -  34.0 pg   MCHC 31.6 30.0 - 36.0 g/dL   RDW 86.5 78.4 - 69.6 %   Platelets 174 150 - 400 K/uL   nRBC 0.0 0.0 - 0.2 %   Neutrophils Relative % 67 %   Neutro Abs 1.9 1.7 - 7.7 K/uL   Lymphocytes Relative 18 %   Lymphs Abs 0.5 (L) 0.7 - 4.0 K/uL   Monocytes Relative 14 %   Monocytes Absolute 0.4 0.1 - 1.0 K/uL   Eosinophils Relative 1 %   Eosinophils Absolute 0.0 0.0 - 0.5 K/uL   Basophils Relative 0 %   Basophils Absolute 0.0 0.0 - 0.1 K/uL   Immature Granulocytes 0 %   Abs Immature Granulocytes 0.01 0.00 - 0.07 K/uL   Basic Metabolic Panel: Recent Labs  Lab 05/08/23 1523  NA 140  K 3.9  CL 100  CO2 30  GLUCOSE 108*  BUN 22  CREATININE 1.15  CALCIUM 8.7*   Liver Function Tests: Recent Labs  Lab 05/08/23 1523  AST 23  ALT 16  ALKPHOS 69  BILITOT 0.5  PROT 6.3*  ALBUMIN 3.5   No results for input(s): "LIPASE", "AMYLASE" in the last 168 hours. No results for input(s): "AMMONIA" in the last 168 hours. CBC: Recent Labs  Lab 05/08/23 1523  WBC 2.8*  NEUTROABS 1.9  HGB 11.7*  HCT 37.0*  MCV 98.9  PLT 174   Cardiac Enzymes: Recent Labs  Lab 05/08/23 1523  TROPONINIHS 11    BNP (last 3 results) No results for input(s): "PROBNP" in the last 8760 hours. CBG: No results for input(s): "GLUCAP" in the last 168 hours.  Radiological Exams on Admission:  DG Chest 2 View Result Date: 05/08/2023 CLINICAL DATA:  Shortness of breath EXAM: CHEST - 2 VIEW COMPARISON:  03/02/2023, chest CT 01/08/2023 FINDINGS: Left-sided central venous port tip over the SVC. Stable right upper paramediastinal/medial right upper lobe opacity, corresponding to post therapeutic changes on CT imaging. No acute airspace disease or effusion. Normal cardiac size with aortic atherosclerosis. Chronic compression deformity at the upper lumbar spine. IMPRESSION: No active cardiopulmonary disease. Electronically Signed   By: Jasmine Pang M.D.   On: 05/08/2023 16:40     No intake/output data  recorded. No intake/output data recorded.     Assessment and Plan: * COPD with acute exacerbation (HCC) See HPI, associated with acute hypoxemia with ambulation.  Chest x-ray within normal limit..  Precipitated by influenza A, started on Tamiflu.  We will continue with Solu-Medrol 60 mg daily and DuoNeb every 4 hourly. GI ppx ordered. Lwo threshold for starting antibacterial therapy. However, at this time patine tnon toxic. No fever or white count elevation  Symbicort changed to breo  Dry eyes Chronic. C.w. restasis  OA (osteoarthritis) of hip C.w. voltaren gen.   Med rec done.  Hold loperamide as no diarrhea Continue with Crestor Continue with Tessalon Perles Hold desonide cream as no rash at this time Continue with docusate and sodium tablets Continue with melatonin at bedtime Continue with Aricept 10 and memantine 5 at bedtime Continue with Synthroid 150 mcg daily Continue with vitamin D3-1000 unit daily  Lovenox for DVT ppx    Advance Care Planning:   Code Status: Prior patient wishes to be DNR.  C.w. documented ACP documents in chart.  Consults: none  Family Communication: per patient.  Severity of Illness: The appropriate patient status for this patient is INPATIENT. Inpatient status is judged to be reasonable and necessary in order to provide the required intensity of service to ensure the patient's safety. The patient's presenting symptoms, physical exam findings, and initial radiographic and laboratory data in the context of their chronic comorbidities is felt to place them at high risk for further clinical deterioration. Furthermore, it is not anticipated that the patient will be medically stable for discharge from the hospital within 2 midnights of admission.   * I certify that at the point of admission it is my clinical judgment that the patient will require inpatient hospital care spanning beyond 2 midnights from the point of admission due to high intensity of  service, high risk for further deterioration and high frequency of surveillance required.*  Author: Nolberto Hanlon, MD 05/08/2023 7:11 PM  For on call review [email or web address].

## 2023-05-08 NOTE — Plan of Care (Signed)

## 2023-05-08 NOTE — ED Provider Notes (Signed)
 [place] EMERGENCY DEPARTMENT AT [facility] Provider Note   CSN: [id number] Arrival date & time: 05/08/23  1338     History  Chief Complaint  Patient presents with   flu like synptoms   Shortness of Breath    Ian Aguilar is a 83 y.o. male.  Patient is an 83 year old male who presents emergency department the chief complaint of cough, congestion, shortness of breath which has become worse over the past 2 days.  He notes his cough has been nonproductive in nature.  He denies any active chest pain at this point.  He has had no abdominal pain, nausea, vomiting, diarrhea.  He denies any lower extremity edema.  He has had no associated hemoptysis.  He denies any dizziness, lightheadedness or syncope.  He does wear 2 L nasal cannula at home.   Shortness of Breath      Home Medications Prior to Admission medications   Medication Sig Start Date End Date Taking? Authorizing Provider  acetaminophen (TYLENOL) 325 MG tablet Take by mouth. 12/28/22  Yes [provider]  albuterol (VENTOLIN HFA) 108 (90 Base) MCG/ACT inhaler Inhale 90 mcg into the lungs 4 (four) times daily as needed. 06/16/21  Yes [provider]  Aloe Vera GEL Apply topically.   Yes [provider]  benzonatate (TESSALON) 200 MG capsule Take 200 mg by mouth 2 (two) times daily as needed for cough. 08/17/22  Yes [provider]  cholecalciferol (VITAMIN D) 1000 units tablet Take 1,000 Units by mouth daily.   Yes [provider]  cycloSPORINE (RESTASIS) 0.05 % ophthalmic emulsion Place 1 drop into both eyes 2 (two) times daily. 08/18/22  Yes [provider]  desonide (DESOWEN) 0.05 % cream Apply topically. 01/16/23  Yes [provider]  docusate sodium (COLACE) 100 MG capsule Take 100 mg by mouth 2 (two) times daily.   Yes [provider]  donepezil (ARICEPT) 10 MG tablet TAKE 1 TABLET BY MOUTH AT BEDTIME. 02/15/23  Yes Gwynneth Munson, Sung Amabile, PA-C   ferrous sulfate 325 (65 FE) MG EC tablet Take 325 mg by mouth daily.   Yes [provider]  GOODSENSE ARTHRITIS PAIN 1 % GEL Apply 2 g topically 4 (four) times daily. 05/07/23  Yes [provider]  GOODSENSE ARTIFICIAL TEARS 0.5-0.6 % SOLN Apply to eye. 05/24/20  Yes [provider]  levothyroxine (SYNTHROID) 150 MCG tablet Take 150 mcg by mouth daily before breakfast.   Yes [provider]  loperamide (IMODIUM) 2 MG capsule Take by mouth as needed for diarrhea or loose stools.   Yes [provider]  melatonin 5 MG TABS Take 5 mg by mouth at bedtime.   Yes [provider]  MUCUS RELIEF 600 MG 12 hr tablet Take 600 mg by mouth 2 (two) times daily. 04/24/23  Yes [provider]  mupirocin ointment (BACTROBAN) 2 % 1 Application 3 (three) times daily.   Yes [provider]  OXYGEN Inhale 2 L into the lungs continuous. 04/24/20  Yes [provider]  Psyllium (METAMUCIL PO) Take by mouth. 1 capsule twice daily   Yes [provider]  rosuvastatin (CRESTOR) 10 MG tablet Take 1 tablet (10 mg total) by mouth daily. 05/21/20  Yes Sharee Holster, NP  TRELEGY ELLIPTA 200-62.5-25 MCG/ACT AEPB Inhale 1 puff into the lungs daily. 09/15/22  Yes [provider]  vitamin B-12 (CYANOCOBALAMIN) 1000 MCG tablet Take 1 tablet (1,000 mcg total) by mouth daily. 02/10/21  Yes  Doreatha Massed, MD  memantine [facility]) 5 MG tablet Take 1 tablet (5 mg at night) for 2 weeks, then increase to 1 tablet (5 mg) twice a day 02/15/23   Marcos Eke, PA-C      Allergies    Patient has no known allergies.    Review of Systems   Review of Systems  Respiratory:  Positive for shortness of breath.   All other systems reviewed and are negative.   Physical Exam Updated Vital Signs BP (!) 148/63   Pulse (!) 57   Temp 98.9 F (37.2 C) (Oral)   Resp 17   Ht 5\' 11"  (1.803 m)   Wt 69.4 kg   SpO2 100%   BMI 21.34 kg/m   Physical Exam Vitals and nursing note reviewed.  Constitutional:      Appearance: Normal appearance.  HENT:     Head: Normocephalic and atraumatic.     Nose: Nose normal.     Mouth/Throat:     Mouth: Mucous membranes are moist.  Eyes:     Extraocular Movements: Extraocular movements intact.     Conjunctiva/sclera: Conjunctivae normal.     Pupils: Pupils are equal, round, and reactive to light.  Cardiovascular:     Rate and Rhythm: Normal rate and regular rhythm.     Pulses: Normal pulses.     Heart sounds: Normal heart sounds.  Pulmonary:     Effort: Pulmonary effort is normal. No tachypnea or respiratory distress.     Breath sounds: Wheezing present. No decreased breath sounds, rhonchi or rales.  Chest:     Chest wall: No tenderness or edema.  Abdominal:     General: Abdomen is flat. Bowel sounds are normal.     Palpations: Abdomen is soft.     Tenderness: There is no abdominal tenderness. There is no guarding.  Musculoskeletal:        General: Normal range of motion.     Cervical back: Normal range of motion and neck supple.     Right lower leg: No edema.     Left lower leg: No edema.  Skin:    General: Skin is warm and dry.  Neurological:     General: No focal deficit present.     Mental Status: He is alert and oriented to person, place, and time. Mental status is at baseline.  Psychiatric:        Mood and Affect: Mood normal.        Behavior: Behavior normal.        Thought Content: Thought content normal.        Judgment: Judgment normal.     ED Results / Procedures / Treatments   Labs (all labs ordered are listed, but only abnormal results are displayed) Labs Reviewed  RESP PANEL BY RT-PCR (RSV, FLU A&B, COVID)  [id number] - Abnormal; Notable for the following components:      Result Value   Influenza A by PCR POSITIVE (*)    All other components within normal limits  CULTURE, BLOOD (ROUTINE X 2)  CULTURE, BLOOD (ROUTINE X 2)  BRAIN NATRIURETIC PEPTIDE   COMPREHENSIVE METABOLIC PANEL  LACTIC ACID, PLASMA  LACTIC ACID, PLASMA  CBC WITH DIFFERENTIAL/PLATELET  TROPONIN I (HIGH SENSITIVITY)    EKG None  Radiology No results found.  Procedures .Critical Care  Performed by: Lelon Perla, PA-C Authorized by: Lelon Perla, PA-C   Critical care provider statement:    Critical care time (minutes):  35  Critical care was necessary to treat or prevent imminent or life-threatening deterioration of the following conditions:  Respiratory failure   Critical care was time spent personally by me on the following activities:  Development of treatment plan with patient or surrogate, discussions with consultants, evaluation of patient's response to treatment, examination of patient, ordering and review of laboratory studies, ordering and review of radiographic studies, ordering and performing treatments and interventions, pulse oximetry, re-evaluation of patient's condition and review of old charts   I assumed direction of critical care for this patient from another provider in my specialty: no     Care discussed with: admitting provider       Medications Ordered in ED Medications  albuterol (VENTOLIN HFA) 108 (90 Base) MCG/ACT inhaler 2 puff (has no administration in time range)  albuterol (PROVENTIL,VENTOLIN) solution continuous neb (10 mg/hr Nebulization Not Given 05/08/23 1511)  methylPREDNISolone sodium succinate (SOLU-MEDROL) 125 mg/2 mL injection 125 mg (has no administration in time range)  albuterol (PROVENTIL) (2.5 MG/3ML) 0.083% nebulizer solution (10 mg  Given 05/08/23 1510)    ED Course/ Medical Decision Making/ A&P                                 Medical Decision Making Amount and/or Complexity of Data Reviewed Labs: ordered. Radiology: ordered.  Risk Prescription drug management. Decision regarding hospitalization.   This patient presents to the ED for concern of of, congestion, shortness of breath, this  involves an extensive number of treatment options, and is a complaint that carries with it a high risk of complications and morbidity.  The differential diagnosis includes sepsis, pneumonia, acute viral syndrome, COPD exacerbation, acute CHF, ACS, pulmonary embolus   Co morbidities that complicate the patient evaluation  COPD   Additional history obtained:  Additional history obtained from none External records from outside source obtained and reviewed including none   Lab Tests:  I Ordered, and personally interpreted labs.  The pertinent results include: Leukopenia, anemia, normal kidney function liver function, no electrolyte changes, normal BNP, normal troponin   Imaging Studies ordered:  I ordered imaging studies including chest x-ray I independently visualized and interpreted imaging which showed no acute cardiopulmonary process I agree with the radiologist interpretation   Cardiac Monitoring: / EKG:  The patient was maintained on a cardiac monitor.  I personally viewed and interpreted the cardiac monitored which showed an underlying rhythm of: Normal sinus rhythm, first-degree AV block, no ST/T wave changes, no ischemic changes, no STEMI   Consultations Obtained:  I requested consultation with the hospitalist,  and discussed lab and imaging findings as well as pertinent plan - they recommend: Admission   Problem List / ED Course / Critical interventions / Medication management  Patient is doing well at this time and does remain stable.  Discussed with patient we will plan for admission to the hospitalist service for his influenza as well as COPD exacerbation.  He is requiring increased oxygen at this time and did desat on his home oxygen.  He does not appear to be suffering from ACS or acute CHF at this time.  Patient has no indication for pneumonia on chest x-ray.  He has been given a dose of Tamiflu in the emergency department.  I have discussed patient case with Dr.   Maximiano Coss with the hospitalist service who has excepted at this time.   I ordered medication including albuterol, Solu-Medrol, Tamiflu for COPD exacerbation,  pneumonia Reevaluation of the patient after these medicines showed that the patient improved I have reviewed the patients home medicines and have made adjustments as needed   Social Determinants of Health:  None   Test / Admission - Considered:  Admission        Final Clinical Impression(s) / ED Diagnoses Final diagnoses:  None    Rx / DC Orders ED Discharge Orders     None         Kathlen Mody 05/08/23 Erick Alley, MD 05/10/23 1719

## 2023-05-08 NOTE — Assessment & Plan Note (Signed)
 Chronic. C.w. restasis

## 2023-05-08 NOTE — Assessment & Plan Note (Signed)
 C.w. voltaren gen.

## 2023-05-08 NOTE — Assessment & Plan Note (Addendum)
 See HPI, associated with acute hypoxemia with ambulation.  Chest x-ray within normal limit..  Precipitated by influenza A, started on Tamiflu.  We will continue with Solu-Medrol 60 mg daily and DuoNeb every 4 hourly. GI ppx ordered. Lwo threshold for starting antibacterial therapy. However, at this time patine tnon toxic. No fever or white count elevation  Symbicort changed to breo

## 2023-05-09 DIAGNOSIS — J441 Chronic obstructive pulmonary disease with (acute) exacerbation: Secondary | ICD-10-CM | POA: Diagnosis not present

## 2023-05-09 DIAGNOSIS — J101 Influenza due to other identified influenza virus with other respiratory manifestations: Secondary | ICD-10-CM | POA: Diagnosis not present

## 2023-05-09 LAB — CBC
HCT: 35.9 % — ABNORMAL LOW (ref 39.0–52.0)
Hemoglobin: 11.4 g/dL — ABNORMAL LOW (ref 13.0–17.0)
MCH: 30.9 pg (ref 26.0–34.0)
MCHC: 31.8 g/dL (ref 30.0–36.0)
MCV: 97.3 fL (ref 80.0–100.0)
Platelets: 173 10*3/uL (ref 150–400)
RBC: 3.69 MIL/uL — ABNORMAL LOW (ref 4.22–5.81)
RDW: 14 % (ref 11.5–15.5)
WBC: 1.8 10*3/uL — ABNORMAL LOW (ref 4.0–10.5)
nRBC: 0 % (ref 0.0–0.2)

## 2023-05-09 LAB — BASIC METABOLIC PANEL
Anion gap: 9 (ref 5–15)
BUN: 26 mg/dL — ABNORMAL HIGH (ref 8–23)
CO2: 29 mmol/L (ref 22–32)
Calcium: 8.9 mg/dL (ref 8.9–10.3)
Chloride: 103 mmol/L (ref 98–111)
Creatinine, Ser: 0.98 mg/dL (ref 0.61–1.24)
GFR, Estimated: >60 mL/min (ref >60)
Glucose, Bld: 173 mg/dL — ABNORMAL HIGH (ref 70–99)
Potassium: 4.2 mmol/L (ref 3.5–5.1)
Sodium: 141 mmol/L (ref 135–145)

## 2023-05-09 LAB — PROTIME-INR
INR: 1.1 (ref 0.8–1.2)
Prothrombin Time: 14.5 s (ref 11.4–15.2)

## 2023-05-09 LAB — APTT: aPTT: 30 s (ref 24–36)

## 2023-05-09 MED ORDER — OSELTAMIVIR PHOSPHATE 75 MG PO CAPS: 75.0000 mg | ORAL_CAPSULE | Freq: Two times a day (BID) | ORAL | 0 refills | Status: AC

## 2023-05-09 MED ORDER — PANTOPRAZOLE SODIUM 40 MG IV SOLR
40.0000 mg | Freq: Every day | INTRAVENOUS | Status: DC
  Administered 2023-05-09: 40 mg via INTRAVENOUS

## 2023-05-09 MED ORDER — GUAIFENESIN ER 600 MG PO TB12: 600.0000 mg | ORAL_TABLET | Freq: Two times a day (BID) | ORAL | 0 refills | Status: AC

## 2023-05-09 MED ORDER — IPRATROPIUM-ALBUTEROL 0.5-2.5 (3) MG/3ML IN SOLN
3.0000 mL | Freq: Four times a day (QID) | RESPIRATORY_TRACT | Status: DC
  Administered 2023-05-09: 3 mL via RESPIRATORY_TRACT
  Filled 2023-05-09 (×2): qty 3

## 2023-05-09 MED ORDER — IPRATROPIUM-ALBUTEROL 0.5-2.5 (3) MG/3ML IN SOLN: 3.0000 mL | RESPIRATORY_TRACT | 1 refills | Status: DC | PRN

## 2023-05-09 MED ORDER — PANTOPRAZOLE SODIUM 40 MG PO TBEC: 40.0000 mg | DELAYED_RELEASE_TABLET | Freq: Every day | ORAL | Status: DC

## 2023-05-09 MED ORDER — ALBUTEROL SULFATE HFA 108 (90 BASE) MCG/ACT IN AERS: 1.0000 | INHALATION_SPRAY | Freq: Four times a day (QID) | RESPIRATORY_TRACT | 2 refills | Status: DC | PRN

## 2023-05-09 MED ORDER — PREDNISONE 20 MG PO TABS: ORAL_TABLET | ORAL | 0 refills | Status: DC

## 2023-05-09 MED ORDER — PANTOPRAZOLE SODIUM 40 MG PO TBEC: 40.0000 mg | DELAYED_RELEASE_TABLET | Freq: Every day | ORAL | 0 refills | Status: DC

## 2023-05-09 NOTE — Care Management Obs Status (Signed)
 MEDICARE OBSERVATION STATUS NOTIFICATION   Patient Details  Name: Ian Aguilar MRN: [id number] Date of Birth: 12-21-1940   Medicare Observation Status Notification Given:  Yes    Karn Cassis, LCSW 05/09/2023, 10:57 AM

## 2023-05-09 NOTE — TOC Transition Note (Signed)
 Transition of Care [facility]) - Discharge Note   Patient Details  Name: Ian Aguilar MRN: [id number] Date of Birth: [birth year]  Transition of Care [facility]) CM/SW Contact:  Karn Cassis, LCSW Phone Number: 05/09/2023, 12:46 PM   Clinical Narrative: Pt d/c today back to [facility]. Pt, son, and facility aware and agreeable. Pt requested son transport if able. Pt's son said he will need facility to pick up pt. Pt will require nebulizer. Discussed with son who agrees to refer to [facility]. Zach with [employer] notified and will deliver to room prior to d/c today. D/C summary sent to [facility]. No FL2 needed due to <24 hour admission.       Final next level of care: Assisted Living Barriers to Discharge: Barriers Resolved   Patient Goals and CMS Choice Patient states their goals for this hospitalization and ongoing recovery are:: return to ALF   Choice offered to / list presented to : Adult Children [place] ownership interest in [facility].provided to::  (n/a)    Discharge Placement                Patient to be transferred to facility by: facility Zenaida Niece Name of family member notified: son Patient and family notified of of transfer: 05/09/23  Discharge Plan and Services Additional resources added to the After Visit Summary for   In-house Referral: Clinical Social Work              DME Arranged: Community education officer DME Agency: [employer] Date DME Agency Contacted: 05/09/23 Time DME Agency Contacted: [id number] Representative spoke with at DME Agency: Ian Malkin            Social Drivers of Health (SDOH) Interventions SDOH Screenings   Food Insecurity: No Food Insecurity (05/08/2023)  Housing: Low Risk  (05/08/2023)  Transportation Needs: No Transportation Needs (05/08/2023)  Utilities: Not At Risk (05/08/2023)  Social Connections: Socially Isolated (05/08/2023)  Tobacco Use: Medium Risk (05/08/2023)     Readmission Risk Interventions    05/09/2023    9:20 AM   Readmission Risk Prevention Plan  Transportation Screening Complete  HRI or Home Care Consult Complete  Social Work Consult for Recovery Care Planning/Counseling Complete  Palliative Care Screening Not Applicable  Medication Review Oceanographer) Complete

## 2023-05-09 NOTE — TOC Initial Note (Signed)
 Transition of Care [facility]) - Initial/Assessment Note    Patient Details  Name: Ian Aguilar MRN: [id number] Date of Birth: January 04, 1941  Transition of Care [facility]) CM/SW Contact:    Karn Cassis, LCSW Phone Number: 05/09/2023, 9:26 AM  Clinical Narrative: Pt admitted for COPD exacerbation. Assessment completed due to high risk readmission score. Pt has been a resident at [facility] for about 3 years. Per Nettie Elm at [facility], he requires assistance with bathing and dressing. Pt uses wheelchair and requires 2L home O2 [facility]). He is active with outpatient PT in facility so will not need orders at d/c. Okay to return. Son confirms above with plan to return to [facility] when medically stable. TOC will follow.                     Expected Discharge Plan: Assisted Living Barriers to Discharge: Continued Medical Work up   Patient Goals and CMS Choice Patient states their goals for this hospitalization and ongoing recovery are:: return to ALF   Choice offered to / list presented to : Adult Children [place] ownership interest in [facility].provided to::  (n/a)    Expected Discharge Plan and Services In-house Referral: Clinical Social Work     Living arrangements for the past 2 months: Assisted Living Facility                                      Prior Living Arrangements/Services Living arrangements for the past 2 months: Assisted Living Facility Lives with:: Facility Resident Patient language and need for interpreter reviewed:: Yes Do you feel safe going back to the place where you live?: Yes        Care giver support system in place?: Yes (comment) Current home services: DME (wheelchair, O2) Criminal Activity/Legal Involvement Pertinent to Current Situation/Hospitalization: No - Comment as needed  Activities of Daily Living   ADL Screening (condition at time of admission) Independently performs ADLs?: No Does the patient have a NEW  difficulty with bathing/dressing/toileting/self-feeding that is expected to last >3 days?: No Does the patient have a NEW difficulty with getting in/out of bed, walking, or climbing stairs that is expected to last >3 days?: No Does the patient have a NEW difficulty with communication that is expected to last >3 days?: No Is the patient deaf or have difficulty hearing?: No Does the patient have difficulty seeing, even when wearing glasses/contacts?: No Does the patient have difficulty concentrating, remembering, or making decisions?: No  Permission Sought/Granted         Permission granted to share info w AGENCY: [facility]  Permission granted to share info w Relationship: ALF     Emotional Assessment         Alcohol / Substance Use: Not Applicable Psych Involvement: No (comment)  Admission diagnosis:  Influenza A [J10.1] COPD exacerbation (HCC) [J44.1] Acute respiratory failure with hypoxia (HCC) [J96.01] Patient Active Problem List   Diagnosis Date Noted   Dry eyes 05/08/2023   COPD exacerbation (HCC) 05/08/2023   Dementia without behavioral disturbance (HCC) 02/15/2023   Iron deficiency anemia 02/14/2021   Neurocognitive deficits 04/29/2020   Hyperlipidemia LDL goal <100 04/27/2020   Metacarpal bone fracture 04/27/2020   Aortic atherosclerosis (HCC) 04/27/2020   Chronic constipation 04/27/2020   Severe hypothyroidism 04/22/2020   Distention of biliary tract 04/21/2020   AKI (acute kidney injury) (HCC) 04/21/2020   Hypothyroidism 04/21/2020  Abdominal distension 04/20/2020   Acute encephalopathy 10/23/2019   Dehydration 10/23/2019   COPD with acute exacerbation (HCC) 10/23/2019   Chronic hypoxemic respiratory failure (HCC) 10/23/2019   Hypertensive urgency 10/23/2019   Goals of care, counseling/discussion    Palliative care by specialist    DNR (do not resuscitate) discussion    Malignant neoplasm of bronchus and lung (HCC)    Carcinoma, lung (HCC) 06/05/2017    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   Hepatic cirrhosis (HCC) 04/06/2017   History of colonic polyps 04/06/2017   History of snoring 09/07/2015   Fracture of ulnar styloid    Benign essential HTN    Faintness    Thrombocytopenia (HCC) 09/03/2015   Hyponatremia 09/03/2015   Closed displaced intertrochanteric fracture of right femur (HCC)    Distal radius fracture, right 09/02/2015   Fracture of right hip requiring operative repair (HCC) 09/01/2015   Alcohol abuse 09/01/2015   Tobacco abuse 09/01/2015   COPD (chronic obstructive pulmonary disease) 09/01/2015   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   Weakness of left leg 01/10/2012   Difficulty walking 01/10/2012   Pain in left hip 01/10/2012   Acute blood loss anemia 12/21/2011   Cirrhosis- imaging diagnosis 2013 09/08/2011   Umbilical hernia 09/08/2011   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   Hepatomegaly 06/14/2011   Encounter for screening colonoscopy 06/14/2011   JOINT EFFUSION, KNEE 02/10/2008   KNEE PAIN 02/10/2008   PCP:  Assunta Found, MD Pharmacy:   [facility], [place] - [address] [place] [address] Phone: [phone] Fax: [phone]     Social Drivers of Health (SDOH) Social History: SDOH Screenings   Food Insecurity: No Food Insecurity (05/08/2023)  Housing: Low Risk  (05/08/2023)  Transportation Needs: No Transportation Needs (05/08/2023)  Utilities: Not At Risk (05/08/2023)  Social Connections: Socially Isolated (05/08/2023)  Tobacco Use: Medium Risk (05/08/2023)   SDOH Interventions:     Readmission Risk Interventions    05/09/2023    9:20 AM  Readmission Risk Prevention Plan  Transportation Screening Complete  HRI or Home Care Consult Complete  Social Work Consult for Recovery Care Planning/Counseling Complete  Palliative Care Screening Not Applicable  Medication Review Oceanographer) Complete

## 2023-05-09 NOTE — Care Management CC44 (Signed)
 Condition Code 44 Documentation Completed  Patient Details  Name: ANUSH WIEDEMAN MRN: [id number] Date of Birth: [birth year]   Condition Code 44 given:  Yes Patient signature on Condition Code 44 notice:  Yes Documentation of 2 MD's agreement:  Yes Code 44 added to claim:  Yes    Karn Cassis, LCSW 05/09/2023, 10:57 AM

## 2023-05-09 NOTE — Discharge Instructions (Signed)
 IMPORTANT INFORMATION: PAY CLOSE ATTENTION  ? ?PHYSICIAN DISCHARGE INSTRUCTIONS ? ?Follow with Primary care provider  Assunta Found, MD  and other consultants as instructed by your Hospitalist Physician ? ?SEEK MEDICAL CARE OR RETURN TO EMERGENCY ROOM IF SYMPTOMS COME BACK, WORSEN OR NEW PROBLEM DEVELOPS  ? ?Please note: ?You were cared for by a hospitalist during your hospital stay. Every effort will be made to forward records to your primary care provider.  You can request that your primary care provider send for your hospital records if they have not received them.  Once you are discharged, your primary care physician will handle any further medical issues. Please note that NO REFILLS for any discharge medications will be authorized once you are discharged, as it is imperative that you return to your primary care physician (or establish a relationship with a primary care physician if you do not have one) for your post hospital discharge needs so that they can reassess your need for medications and monitor your lab values. ? ?Please get a complete blood count and chemistry panel checked by your Primary MD at your next visit, and again as instructed by your Primary MD. ? ?Get Medicines reviewed and adjusted: ?Please take all your medications with you for your next visit with your Primary MD ? ?Laboratory/radiological data: ?Please request your Primary MD to go over all hospital tests and procedure/radiological results at the follow up, please ask your primary care provider to get all Hospital records sent to his/her office. ? ?In some cases, they will be blood work, cultures and biopsy results pending at the time of your discharge. Please request that your primary care provider follow up on these results. ? ?If you are diabetic, please bring your blood sugar readings with you to your follow up appointment with primary care.   ? ?Please call and make your follow up appointments as soon as possible.   ? ?Also Note  the following: ?If you experience worsening of your admission symptoms, develop shortness of breath, life threatening emergency, suicidal or homicidal thoughts you must seek medical attention immediately by calling 911 or calling your MD immediately  if symptoms less severe. ? ?You must read complete instructions/literature along with all the possible adverse reactions/side effects for all the Medicines you take and that have been prescribed to you. Take any new Medicines after you have completely understood and accpet all the possible adverse reactions/side effects.  ? ?Do not drive when taking Pain medications or sleeping medications (Benzodiazepines) ? ?Do not take more than prescribed Pain, Sleep and Anxiety Medications. It is not advisable to combine anxiety,sleep and pain medications without talking with your primary care practitioner ? ?Special Instructions: If you have smoked or chewed Tobacco  in the last 2 yrs please stop smoking, stop any regular Alcohol  and or any Recreational drug use. ? ?Wear Seat belts while driving.  Do not drive if taking any narcotic, mind altering or controlled substances or recreational drugs or alcohol.  ? ? ? ? ? ?

## 2023-05-09 NOTE — Discharge Summary (Signed)
 Physician Discharge Summary  Ian Aguilar:[id number] DOB: [birth year] DOA: 05/08/2023  PCP: Assunta Found, MD  Admit date: 05/08/2023 Discharge date: 05/09/2023  Admitted From:  ALF  Disposition:  ALF   Recommendations for Outpatient Follow-up:  Follow up with PCP in 1 weeks  Home Health: ALF   Discharge Condition: STABLE   CODE STATUS: DNR DIET: resume previous home diet    Brief Hospitalization Summary: Please see all hospital notes, images, labs for full details of the hospitalization. Admission provider HPI:   83 y.o. male with medical history significant of known COPD.  Patient uses 2 L/min of supplementary oxygen at home.  Patient was in his usual state of health till about 2 days ago when he reports a new onset of cough with some expectoration yellow-colored.  Associated with worsening sensation of shortness of breath with ambulation.  Patient denies any fever chest pain leg swelling palpitation or loss of consciousness.  Patient came to the ER today because of worsening sensation of shortness of breath.   Here in the ER, patient is status post Solu-Medrol and DuoNeb for finding of wheezing on physical exam.  Chest x-ray reportedly normal.  However on ambulating the patient patient SpO2 dipped down to 79% on 2 L/min of supplementary oxygen and patient was noted to be in respiratory distress.  Medical evaluation is sought.   Patient influenza positive.   Patient at this time is back in the stretcher with the oxygen and is completely asymptomatic.  Hospital Course  Pt was placed in observation and was treated with steroids and bronchodilators, fortunately patient continues to improve and feels like he is back to his baseline.  He is taking oral Tamiflu with no difficulties.  He is on his baseline oxygen requirement.  He is eager to discharge back to ALF today which is appropriate.  Discharge back to ALF complete 8 remaining doses of Tamiflu.  Prednisone taper prescribed with  albuterol bronchodilators as needed.  Discharged in stable condition.  Discharge Diagnoses:  Principal Problem:   COPD with acute exacerbation (HCC) Active Problems:   Dry eyes   COPD exacerbation (HCC)   Influenza A   Discharge Instructions: Discharge Instructions     For home use only DME Nebulizer machine   Complete by: As directed    Patient needs a nebulizer to treat with the following condition: COPD with acute exacerbation (HCC)   Length of Need: Lifetime   Additional equipment included:  Administration kit Filter        Allergies as of 05/09/2023   No Known Allergies      Medication List     STOP taking these medications    mupirocin ointment 2 % Commonly known as: BACTROBAN       TAKE these medications    acetaminophen 325 MG tablet Commonly known as: TYLENOL Take by mouth.   albuterol 108 (90 Base) MCG/ACT inhaler Commonly known as: VENTOLIN HFA Inhale 1 puff into the lungs 4 (four) times daily as needed.   Aloe Vera Gel Apply topically.   benzonatate 200 MG capsule Commonly known as: TESSALON Take 200 mg by mouth 2 (two) times daily as needed for cough.   cholecalciferol 25 MCG (1000 UNIT) tablet Commonly known as: VITAMIN D3 Take 1,000 Units by mouth daily.   cyanocobalamin 1000 MCG tablet Commonly known as: VITAMIN B12 Take 1 tablet (1,000 mcg total) by mouth daily.   cycloSPORINE 0.05 % ophthalmic emulsion Commonly known as: RESTASIS Place 1 drop into  both eyes 2 (two) times daily.   desonide 0.05 % cream Commonly known as: DESOWEN Apply topically.   docusate sodium 100 MG capsule Commonly known as: COLACE Take 100 mg by mouth 2 (two) times daily.   donepezil 10 MG tablet Commonly known as: ARICEPT TAKE 1 TABLET BY MOUTH AT BEDTIME.   ferrous sulfate 325 (65 FE) MG EC tablet Take 325 mg by mouth daily.   GoodSense Arthritis Pain 1 % Gel Generic drug: diclofenac Sodium Apply 2 g topically 4 (four) times daily.    GoodSense Artificial Tears 0.5-0.6 % Soln Generic drug: Polyvinyl Alcohol-Povidone Apply to eye.   guaiFENesin 600 MG 12 hr tablet Commonly known as: Mucinex Take 1 tablet (600 mg total) by mouth 2 (two) times daily for 5 days.   ipratropium-albuterol 0.5-2.5 (3) MG/3ML Soln Commonly known as: DUONEB Take 3 mLs by nebulization every 4 (four) hours as needed (wheezing, shortness of breath).   levothyroxine 150 MCG tablet Commonly known as: SYNTHROID Take 150 mcg by mouth daily before breakfast.   loperamide 2 MG capsule Commonly known as: IMODIUM Take by mouth as needed for diarrhea or loose stools.   melatonin 5 MG Tabs Take 5 mg by mouth at bedtime.   memantine 5 MG tablet Commonly known as: NAMENDA Take 1 tablet (5 mg at night) for 2 weeks, then increase to 1 tablet (5 mg) twice a day   METAMUCIL PO Take by mouth. 1 capsule twice daily   oseltamivir 75 MG capsule Commonly known as: TAMIFLU Take 1 capsule (75 mg total) by mouth 2 (two) times daily for 8 doses.   OXYGEN Inhale 2 L into the lungs continuous.   pantoprazole 40 MG tablet Commonly known as: PROTONIX Take 1 tablet (40 mg total) by mouth daily for 14 days. Start taking on: May 10, 2023   predniSONE 20 MG tablet Commonly known as: DELTASONE Take 3 PO QAM x3days, 2 PO QAM x3days, 1 PO QAM x3days Start taking on: May 10, 2023   rosuvastatin 10 MG tablet Commonly known as: CRESTOR Take 1 tablet (10 mg total) by mouth daily.   Trelegy Ellipta 200-62.5-25 MCG/ACT Aepb Generic drug: Fluticasone-Umeclidin-Vilant Inhale 1 puff into the lungs daily.               Durable Medical Equipment  (From admission, onward)           Start     Ordered   05/09/23 0000  For home use only DME Nebulizer machine       Question Answer Comment  Patient needs a nebulizer to treat with the following condition COPD with acute exacerbation (HCC)   Length of Need Lifetime   Additional equipment included  Administration kit   Additional equipment included Filter      05/09/23 1201            Follow-up Information     Assunta Found, MD. Schedule an appointment as soon as possible for a visit in 1 week(s).   Specialty: Family Medicine Why: Hospital Follow Up Contact information: [address] [place] [address] [phone]                No Known Allergies Allergies as of 05/09/2023   No Known Allergies      Medication List     STOP taking these medications    mupirocin ointment 2 % Commonly known as: BACTROBAN       TAKE these medications    acetaminophen 325 MG  tablet Commonly known as: TYLENOL Take by mouth.   albuterol 108 (90 Base) MCG/ACT inhaler Commonly known as: VENTOLIN HFA Inhale 1 puff into the lungs 4 (four) times daily as needed.   Aloe Vera Gel Apply topically.   benzonatate 200 MG capsule Commonly known as: TESSALON Take 200 mg by mouth 2 (two) times daily as needed for cough.   cholecalciferol 25 MCG (1000 UNIT) tablet Commonly known as: VITAMIN D3 Take 1,000 Units by mouth daily.   cyanocobalamin 1000 MCG tablet Commonly known as: VITAMIN B12 Take 1 tablet (1,000 mcg total) by mouth daily.   cycloSPORINE 0.05 % ophthalmic emulsion Commonly known as: RESTASIS Place 1 drop into both eyes 2 (two) times daily.   desonide 0.05 % cream Commonly known as: DESOWEN Apply topically.   docusate sodium 100 MG capsule Commonly known as: COLACE Take 100 mg by mouth 2 (two) times daily.   donepezil 10 MG tablet Commonly known as: ARICEPT TAKE 1 TABLET BY MOUTH AT BEDTIME.   ferrous sulfate 325 (65 FE) MG EC tablet Take 325 mg by mouth daily.   GoodSense Arthritis Pain 1 % Gel Generic drug: diclofenac Sodium Apply 2 g topically 4 (four) times daily.   GoodSense Artificial Tears 0.5-0.6 % Soln Generic drug: Polyvinyl Alcohol-Povidone Apply to eye.   guaiFENesin 600 MG 12 hr tablet Commonly known as:  Mucinex Take 1 tablet (600 mg total) by mouth 2 (two) times daily for 5 days.   ipratropium-albuterol 0.5-2.5 (3) MG/3ML Soln Commonly known as: DUONEB Take 3 mLs by nebulization every 4 (four) hours as needed (wheezing, shortness of breath).   levothyroxine 150 MCG tablet Commonly known as: SYNTHROID Take 150 mcg by mouth daily before breakfast.   loperamide 2 MG capsule Commonly known as: IMODIUM Take by mouth as needed for diarrhea or loose stools.   melatonin 5 MG Tabs Take 5 mg by mouth at bedtime.   memantine 5 MG tablet Commonly known as: NAMENDA Take 1 tablet (5 mg at night) for 2 weeks, then increase to 1 tablet (5 mg) twice a day   METAMUCIL PO Take by mouth. 1 capsule twice daily   oseltamivir 75 MG capsule Commonly known as: TAMIFLU Take 1 capsule (75 mg total) by mouth 2 (two) times daily for 8 doses.   OXYGEN Inhale 2 L into the lungs continuous.   pantoprazole 40 MG tablet Commonly known as: PROTONIX Take 1 tablet (40 mg total) by mouth daily for 14 days. Start taking on: May 10, 2023   predniSONE 20 MG tablet Commonly known as: DELTASONE Take 3 PO QAM x3days, 2 PO QAM x3days, 1 PO QAM x3days Start taking on: May 10, 2023   rosuvastatin 10 MG tablet Commonly known as: CRESTOR Take 1 tablet (10 mg total) by mouth daily.   Trelegy Ellipta 200-62.5-25 MCG/ACT Aepb Generic drug: Fluticasone-Umeclidin-Vilant Inhale 1 puff into the lungs daily.               Durable Medical Equipment  (From admission, onward)           Start     Ordered   05/09/23 0000  For home use only DME Nebulizer machine       Question Answer Comment  Patient needs a nebulizer to treat with the following condition COPD with acute exacerbation (HCC)   Length of Need Lifetime   Additional equipment included Administration kit   Additional equipment included Filter      05/09/23 1201  Procedures/Studies: DG Chest 2 View Result Date:  05/08/2023 CLINICAL DATA:  Shortness of breath EXAM: CHEST - 2 VIEW COMPARISON:  03/02/2023, chest CT 01/08/2023 FINDINGS: Left-sided central venous port tip over the SVC. Stable right upper paramediastinal/medial right upper lobe opacity, corresponding to post therapeutic changes on CT imaging. No acute airspace disease or effusion. Normal cardiac size with aortic atherosclerosis. Chronic compression deformity at the upper lumbar spine. IMPRESSION: No active cardiopulmonary disease. Electronically Signed   By: Jasmine Pang M.D.   On: 05/08/2023 16:40   DG Foot Complete Left Result Date: 04/20/2023 Images left foot Metatarsal pain X-rays show no evidence of fracture or dislocation There is some prominence of the metatarsal heads plantarward.  Osteopenia Impression normal foot with osteopenia     Subjective: Pt reports he is breathing at baseline and he is on his baseline 2L/min oxygen, he is asking to go home today; he feels better.  He says he will take his medications as prescribed.  He is eager to go back to ALF today.    Discharge Exam: Vitals:   05/09/23 0805 05/09/23 0809  BP:    Pulse:    Resp:    Temp:    SpO2: 99% 100%   Vitals:   05/08/23 2316 05/09/23 0409 05/09/23 0805 05/09/23 0809  BP: (!) 121/52 (!) 155/56    Pulse: 67 (!) 45    Resp: 18 19    Temp: 98.1 F (36.7 C) (!) 97.4 F (36.3 C)    TempSrc: Oral Oral    SpO2: 97% 100% 99% 100%  Weight:      Height:       General: Pt is alert, awake, not in acute distress Cardiovascular: RRR, S1/S2 +, no rubs, no gallops Respiratory: rare expiratory wheezing, no rhonchi Abdominal: Soft, NT, ND, bowel sounds + Extremities: no edema, no cyanosis   The results of significant diagnostics from this hospitalization (including imaging, microbiology, ancillary and laboratory) are listed below for reference.     Microbiology: Recent Results (from the past 240 hours)  Resp panel by RT-PCR (RSV, Flu A&B, Covid) Anterior Nasal  Swab     Status: Abnormal   Collection Time: 05/08/23  2:02 PM   Specimen: Anterior Nasal Swab  Result Value Ref Range Status   SARS Coronavirus 2 by RT PCR NEGATIVE NEGATIVE Final    Comment: (NOTE) SARS-CoV-2 target nucleic acids are NOT DETECTED.  The SARS-CoV-2 RNA is generally detectable in upper respiratory specimens during the acute phase of infection. The lowest concentration of SARS-CoV-2 viral copies this assay can detect is 138 copies/mL. A negative result does not preclude SARS-Cov-2 infection and should not be used as the sole basis for treatment or other patient management decisions. A negative result may occur with  improper specimen collection/handling, submission of specimen other than nasopharyngeal swab, presence of viral mutation(s) within the areas targeted by this assay, and inadequate number of viral copies(<138 copies/mL). A negative result must be combined with clinical observations, patient history, and epidemiological information. The expected result is Negative.  Fact Sheet for Patients:  BloggerCourse.com  Fact Sheet for Healthcare Providers:  SeriousBroker.it  This test is no t yet approved or cleared by the [place] FDA and  has been authorized for detection and/or diagnosis of SARS-CoV-2 by FDA under an Emergency Use Authorization (EUA). This EUA will remain  in effect (meaning this test can be used) for the duration of the COVID-19 declaration under Section 564(b)(1) of the Act, 21 U.S.C.section 360bbb-3(b)(1),  unless the authorization is terminated  or revoked sooner.       Influenza A by PCR POSITIVE (A) NEGATIVE Final   Influenza B by PCR NEGATIVE NEGATIVE Final    Comment: (NOTE) The Xpert Xpress SARS-CoV-2/FLU/RSV plus assay is intended as an aid in the diagnosis of influenza from Nasopharyngeal swab specimens and should not be used as a sole basis for treatment. Nasal washings  and aspirates are unacceptable for Xpert Xpress SARS-CoV-2/FLU/RSV testing.  Fact Sheet for Patients: BloggerCourse.com  Fact Sheet for Healthcare Providers: SeriousBroker.it  This test is not yet approved or cleared by the [place] FDA and has been authorized for detection and/or diagnosis of SARS-CoV-2 by FDA under an Emergency Use Authorization (EUA). This EUA will remain in effect (meaning this test can be used) for the duration of the COVID-19 declaration under Section 564(b)(1) of the Act, 21 U.S.C. section 360bbb-3(b)(1), unless the authorization is terminated or revoked.     Resp Syncytial Virus by PCR NEGATIVE NEGATIVE Final    Comment: (NOTE) Fact Sheet for Patients: BloggerCourse.com  Fact Sheet for Healthcare Providers: SeriousBroker.it  This test is not yet approved or cleared by the [place] FDA and has been authorized for detection and/or diagnosis of SARS-CoV-2 by FDA under an Emergency Use Authorization (EUA). This EUA will remain in effect (meaning this test can be used) for the duration of the COVID-19 declaration under Section 564(b)(1) of the Act, 21 U.S.C. section 360bbb-3(b)(1), unless the authorization is terminated or revoked.  Performed at [facility], [address], [place], [place] [address]   Culture, blood (routine x 2)     Status: None (Preliminary result)   Collection Time: 05/08/23  3:23 PM   Specimen: BLOOD  Result Value Ref Range Status   Specimen Description BLOOD BLOOD RIGHT ARM  Final   Special Requests   Final    BOTTLES DRAWN AEROBIC AND ANAEROBIC Blood Culture results may not be optimal due to an inadequate volume of blood received in culture bottles   Culture   Final    NO GROWTH < 24 HOURS Performed at [facility], [address], [place], [place] [address]    Report Status PENDING  Incomplete  Culture, blood (routine  x 2)     Status: None (Preliminary result)   Collection Time: 05/08/23  3:23 PM   Specimen: BLOOD  Result Value Ref Range Status   Specimen Description BLOOD BLOOD LEFT ARM  Final   Special Requests   Final    BOTTLES DRAWN AEROBIC AND ANAEROBIC Blood Culture results may not be optimal due to an inadequate volume of blood received in culture bottles   Culture   Final    NO GROWTH < 24 HOURS Performed at [facility], [address], [place], [place] [address]    Report Status PENDING  Incomplete     Labs: BNP (last 3 results) Recent Labs    05/08/23 1523  BNP 89.0   Basic Metabolic Panel: Recent Labs  Lab 05/08/23 1523 05/09/23 0448  NA 140 141  K 3.9 4.2  CL 100 103  CO2 30 29  GLUCOSE 108* 173*  BUN 22 26*  CREATININE 1.15 0.98  CALCIUM 8.7* 8.9   Liver Function Tests: Recent Labs  Lab 05/08/23 1523  AST 23  ALT 16  ALKPHOS 69  BILITOT 0.5  PROT 6.3*  ALBUMIN 3.5   No results for input(s): "LIPASE", "AMYLASE" in the last 168 hours. No results for input(s): "AMMONIA" in  the last 168 hours. CBC: Recent Labs  Lab 05/08/23 1523 05/09/23 0448  WBC 2.8* 1.8*  NEUTROABS 1.9  --   HGB 11.7* 11.4*  HCT 37.0* 35.9*  MCV 98.9 97.3  PLT 174 173   Cardiac Enzymes: No results for input(s): "CKTOTAL", "CKMB", "CKMBINDEX", "TROPONINI" in the last 168 hours. BNP: Invalid input(s): "POCBNP" CBG: No results for input(s): "GLUCAP" in the last 168 hours. D-Dimer No results for input(s): "DDIMER" in the last 72 hours. Hgb A1c No results for input(s): "HGBA1C" in the last 72 hours. Lipid Profile No results for input(s): "CHOL", "HDL", "LDLCALC", "TRIG", "CHOLHDL", "LDLDIRECT" in the last 72 hours. Thyroid function studies No results for input(s): "TSH", "T4TOTAL", "T3FREE", "THYROIDAB" in the last 72 hours.  Invalid input(s): "FREET3" Anemia work up No results for input(s): "VITAMINB12", "FOLATE", "FERRITIN", "TIBC", "IRON", "RETICCTPCT" in the last 72  hours. Urinalysis    Component Value Date/Time   COLORURINE YELLOW 07/19/2020 1916   APPEARANCEUR CLEAR 07/19/2020 1916   LABSPEC 1.017 07/19/2020 1916   PHURINE 7.0 07/19/2020 1916   GLUCOSEU NEGATIVE 07/19/2020 1916   HGBUR NEGATIVE 07/19/2020 1916   BILIRUBINUR NEGATIVE 07/19/2020 1916   KETONESUR 5 (A) 07/19/2020 1916   PROTEINUR NEGATIVE 07/19/2020 1916   NITRITE NEGATIVE 07/19/2020 1916   LEUKOCYTESUR NEGATIVE 07/19/2020 1916   Sepsis Labs Recent Labs  Lab 05/08/23 1523 05/09/23 0448  WBC 2.8* 1.8*   Microbiology Recent Results (from the past 240 hours)  Resp panel by RT-PCR (RSV, Flu A&B, Covid) Anterior Nasal Swab     Status: Abnormal   Collection Time: 05/08/23  2:02 PM   Specimen: Anterior Nasal Swab  Result Value Ref Range Status   SARS Coronavirus 2 by RT PCR NEGATIVE NEGATIVE Final    Comment: (NOTE) SARS-CoV-2 target nucleic acids are NOT DETECTED.  The SARS-CoV-2 RNA is generally detectable in upper respiratory specimens during the acute phase of infection. The lowest concentration of SARS-CoV-2 viral copies this assay can detect is 138 copies/mL. A negative result does not preclude SARS-Cov-2 infection and should not be used as the sole basis for treatment or other patient management decisions. A negative result may occur with  improper specimen collection/handling, submission of specimen other than nasopharyngeal swab, presence of viral mutation(s) within the areas targeted by this assay, and inadequate number of viral copies(<138 copies/mL). A negative result must be combined with clinical observations, patient history, and epidemiological information. The expected result is Negative.  Fact Sheet for Patients:  BloggerCourse.com  Fact Sheet for Healthcare Providers:  SeriousBroker.it  This test is no t yet approved or cleared by the [place] FDA and  has been authorized for detection and/or  diagnosis of SARS-CoV-2 by FDA under an Emergency Use Authorization (EUA). This EUA will remain  in effect (meaning this test can be used) for the duration of the COVID-19 declaration under Section 564(b)(1) of the Act, 21 U.S.C.section 360bbb-3(b)(1), unless the authorization is terminated  or revoked sooner.       Influenza A by PCR POSITIVE (A) NEGATIVE Final   Influenza B by PCR NEGATIVE NEGATIVE Final    Comment: (NOTE) The Xpert Xpress SARS-CoV-2/FLU/RSV plus assay is intended as an aid in the diagnosis of influenza from Nasopharyngeal swab specimens and should not be used as a sole basis for treatment. Nasal washings and aspirates are unacceptable for Xpert Xpress SARS-CoV-2/FLU/RSV testing.  Fact Sheet for Patients: BloggerCourse.com  Fact Sheet for Healthcare Providers: SeriousBroker.it  This test is not yet approved or cleared  by the [place] and has been authorized for detection and/or diagnosis of SARS-CoV-2 by FDA under an Emergency Use Authorization (EUA). This EUA will remain in effect (meaning this test can be used) for the duration of the COVID-19 declaration under Section 564(b)(1) of the Act, 21 U.S.C. section 360bbb-3(b)(1), unless the authorization is terminated or revoked.     Resp Syncytial Virus by PCR NEGATIVE NEGATIVE Final    Comment: (NOTE) Fact Sheet for Patients: BloggerCourse.com  Fact Sheet for Healthcare Providers: SeriousBroker.it  This test is not yet approved or cleared by the [place] FDA and has been authorized for detection and/or diagnosis of SARS-CoV-2 by FDA under an Emergency Use Authorization (EUA). This EUA will remain in effect (meaning this test can be used) for the duration of the COVID-19 declaration under Section 564(b)(1) of the Act, 21 U.S.C. section 360bbb-3(b)(1), unless the authorization is terminated  or revoked.  Performed at [facility], [address], [place], [place] [address]   Culture, blood (routine x 2)     Status: None (Preliminary result)   Collection Time: 05/08/23  3:23 PM   Specimen: BLOOD  Result Value Ref Range Status   Specimen Description BLOOD BLOOD RIGHT ARM  Final   Special Requests   Final    BOTTLES DRAWN AEROBIC AND ANAEROBIC Blood Culture results may not be optimal due to an inadequate volume of blood received in culture bottles   Culture   Final    NO GROWTH < 24 HOURS Performed at [facility], [address], [place], [place] [address]    Report Status PENDING  Incomplete  Culture, blood (routine x 2)     Status: None (Preliminary result)   Collection Time: 05/08/23  3:23 PM   Specimen: BLOOD  Result Value Ref Range Status   Specimen Description BLOOD BLOOD LEFT ARM  Final   Special Requests   Final    BOTTLES DRAWN AEROBIC AND ANAEROBIC Blood Culture results may not be optimal due to an inadequate volume of blood received in culture bottles   Culture   Final    NO GROWTH < 24 HOURS Performed at [facility], [address], [place], [place] [address]    Report Status PENDING  Incomplete   Time coordinating discharge: 32 mins  SIGNED:  Standley Dakins, MD  [facility] 05/09/2023, 12:02 PM How to contact the [facility] Attending or Consulting provider 7A - 7P or covering provider during after hours 7P -7A, for this patient?  Check the care team in [facility] and look for a) attending/consulting [facility] provider listed and b) the [facility] team listed Log into [email or web address] and use [place]'s universal password to access. If you do not have the password, please contact the hospital operator. Locate the [facility] provider you are looking for under [facility] and page to a number that you can be directly reached. If you still have difficulty reaching the provider, please page the [facility] (Director on Call) for the Hospitalists listed on amion for assistance.

## 2023-05-11 DIAGNOSIS — J441 Chronic obstructive pulmonary disease with (acute) exacerbation: Secondary | ICD-10-CM | POA: Diagnosis not present

## 2023-05-11 DIAGNOSIS — Z6821 Body mass index (BMI) 21.0-21.9, adult: Secondary | ICD-10-CM | POA: Diagnosis not present

## 2023-05-11 DIAGNOSIS — J101 Influenza due to other identified influenza virus with other respiratory manifestations: Secondary | ICD-10-CM | POA: Diagnosis not present

## 2023-05-13 LAB — CULTURE, BLOOD (ROUTINE X 2)
Culture: NO GROWTH
Culture: NO GROWTH

## 2023-06-07 DIAGNOSIS — F5105 Insomnia due to other mental disorder: Secondary | ICD-10-CM | POA: Diagnosis not present

## 2023-06-07 DIAGNOSIS — F528 Other sexual dysfunction not due to a substance or known physiological condition: Secondary | ICD-10-CM | POA: Diagnosis not present

## 2023-06-07 DIAGNOSIS — F0393 Unspecified dementia, unspecified severity, with mood disturbance: Secondary | ICD-10-CM | POA: Diagnosis not present

## 2023-06-09 DIAGNOSIS — J101 Influenza due to other identified influenza virus with other respiratory manifestations: Secondary | ICD-10-CM | POA: Diagnosis not present

## 2023-06-09 DIAGNOSIS — J441 Chronic obstructive pulmonary disease with (acute) exacerbation: Secondary | ICD-10-CM | POA: Diagnosis not present

## 2023-06-12 DIAGNOSIS — Z9981 Dependence on supplemental oxygen: Secondary | ICD-10-CM | POA: Diagnosis not present

## 2023-06-12 DIAGNOSIS — N390 Urinary tract infection, site not specified: Secondary | ICD-10-CM | POA: Diagnosis not present

## 2023-06-12 DIAGNOSIS — J449 Chronic obstructive pulmonary disease, unspecified: Secondary | ICD-10-CM | POA: Diagnosis not present

## 2023-06-12 DIAGNOSIS — E039 Hypothyroidism, unspecified: Secondary | ICD-10-CM | POA: Diagnosis not present

## 2023-06-12 DIAGNOSIS — E063 Autoimmune thyroiditis: Secondary | ICD-10-CM | POA: Diagnosis not present

## 2023-06-12 DIAGNOSIS — I10 Essential (primary) hypertension: Secondary | ICD-10-CM | POA: Diagnosis not present

## 2023-06-12 DIAGNOSIS — C3411 Malignant neoplasm of upper lobe, right bronchus or lung: Secondary | ICD-10-CM | POA: Diagnosis not present

## 2023-06-12 DIAGNOSIS — R4182 Altered mental status, unspecified: Secondary | ICD-10-CM | POA: Diagnosis not present

## 2023-06-19 DIAGNOSIS — N390 Urinary tract infection, site not specified: Secondary | ICD-10-CM | POA: Diagnosis not present

## 2023-06-19 DIAGNOSIS — R4182 Altered mental status, unspecified: Secondary | ICD-10-CM | POA: Diagnosis not present

## 2023-06-20 ENCOUNTER — Emergency Department (HOSPITAL_COMMUNITY)
Admission: EM | Admit: 2023-06-20 | Discharge: 2023-06-20 | Disposition: A | Discharge status: Home / Self Care | Attending: Emergency Medicine | Admitting: Emergency Medicine

## 2023-06-20 ENCOUNTER — Emergency Department (HOSPITAL_COMMUNITY)

## 2023-06-20 ENCOUNTER — Other Ambulatory Visit: Payer: Self-pay

## 2023-06-20 DIAGNOSIS — R410 Disorientation, unspecified: Secondary | ICD-10-CM | POA: Diagnosis present

## 2023-06-20 DIAGNOSIS — R7309 Other abnormal glucose: Secondary | ICD-10-CM | POA: Diagnosis not present

## 2023-06-20 DIAGNOSIS — I10 Essential (primary) hypertension: Secondary | ICD-10-CM | POA: Diagnosis not present

## 2023-06-20 DIAGNOSIS — R413 Other amnesia: Secondary | ICD-10-CM | POA: Diagnosis not present

## 2023-06-20 DIAGNOSIS — N3000 Acute cystitis without hematuria: Secondary | ICD-10-CM | POA: Diagnosis not present

## 2023-06-20 DIAGNOSIS — R4182 Altered mental status, unspecified: Secondary | ICD-10-CM | POA: Insufficient documentation

## 2023-06-20 DIAGNOSIS — Z7401 Bed confinement status: Secondary | ICD-10-CM | POA: Diagnosis not present

## 2023-06-20 DIAGNOSIS — F0390 Unspecified dementia without behavioral disturbance: Secondary | ICD-10-CM | POA: Diagnosis not present

## 2023-06-20 DIAGNOSIS — S0003XA Contusion of scalp, initial encounter: Secondary | ICD-10-CM | POA: Diagnosis not present

## 2023-06-20 DIAGNOSIS — I6523 Occlusion and stenosis of bilateral carotid arteries: Secondary | ICD-10-CM | POA: Diagnosis not present

## 2023-06-20 DIAGNOSIS — R0902 Hypoxemia: Secondary | ICD-10-CM | POA: Diagnosis not present

## 2023-06-20 DIAGNOSIS — R0602 Shortness of breath: Secondary | ICD-10-CM | POA: Diagnosis not present

## 2023-06-20 LAB — URINALYSIS, ROUTINE W REFLEX MICROSCOPIC
Bacteria, UA: NONE SEEN
Bilirubin Urine: NEGATIVE
Glucose, UA: NEGATIVE mg/dL
Hgb urine dipstick: NEGATIVE
Ketones, ur: 5 mg/dL — AB
Leukocytes,Ua: NEGATIVE
Nitrite: NEGATIVE
Protein, ur: 100 mg/dL — AB
Specific Gravity, Urine: 1.029 (ref 1.005–1.030)
pH: 5 (ref 5.0–8.0)

## 2023-06-20 LAB — CBC
HCT: 40.8 % (ref 39.0–52.0)
Hemoglobin: 13 g/dL (ref 13.0–17.0)
MCH: 30.7 pg (ref 26.0–34.0)
MCHC: 31.9 g/dL (ref 30.0–36.0)
MCV: 96.2 fL (ref 80.0–100.0)
Platelets: 263 10*3/uL (ref 150–400)
RBC: 4.24 MIL/uL (ref 4.22–5.81)
RDW: 14.7 % (ref 11.5–15.5)
WBC: 6 10*3/uL (ref 4.0–10.5)
nRBC: 0 % (ref 0.0–0.2)

## 2023-06-20 LAB — COMPREHENSIVE METABOLIC PANEL WITH GFR
ALT: 9 U/L (ref 0–44)
AST: 16 U/L (ref 15–41)
Albumin: 3.4 g/dL — ABNORMAL LOW (ref 3.5–5.0)
Alkaline Phosphatase: 71 U/L (ref 38–126)
Anion gap: 11 (ref 5–15)
BUN: 22 mg/dL (ref 8–23)
CO2: 29 mmol/L (ref 22–32)
Calcium: 9.3 mg/dL (ref 8.9–10.3)
Chloride: 97 mmol/L — ABNORMAL LOW (ref 98–111)
Creatinine, Ser: 1.03 mg/dL (ref 0.61–1.24)
GFR, Estimated: >60 mL/min (ref >60)
Glucose, Bld: 111 mg/dL — ABNORMAL HIGH (ref 70–99)
Potassium: 3.6 mmol/L (ref 3.5–5.1)
Sodium: 137 mmol/L (ref 135–145)
Total Bilirubin: 0.8 mg/dL (ref 0.0–1.2)
Total Protein: 6.4 g/dL — ABNORMAL LOW (ref 6.5–8.1)

## 2023-06-20 LAB — CBG MONITORING, ED: Glucose-Capillary: 88 mg/dL (ref 70–99)

## 2023-06-20 LAB — AMMONIA: Ammonia: 19 umol/L (ref 9–35)

## 2023-06-20 LAB — ETHANOL: Alcohol, Ethyl (B): <15 mg/dL (ref <15)

## 2023-06-20 NOTE — ED Triage Notes (Signed)
 Pt arrived [facility] from [facility] with c/o pt has a UTI and started Cipro yesterday morning. Staff feels pt is more altered and maybe septic. Pt is on 2 lpm nasal cannula baseline. Pt denies any pain or urination issues.

## 2023-06-20 NOTE — Discharge Instructions (Addendum)
 Follow up with your doctor next week

## 2023-06-20 NOTE — ED Notes (Addendum)
 Pt assisted with using urinal. Male periwick was applied.

## 2023-06-22 ENCOUNTER — Encounter: Payer: Self-pay | Admitting: Physician Assistant

## 2023-06-22 NOTE — ED Provider Notes (Signed)
  EMERGENCY DEPARTMENT AT [facility] Provider Note   CSN: [id number] Arrival date & time: 06/20/23  1441     History  Chief Complaint  Patient presents with   Altered Mental Status    Ian Aguilar is a 83 y.o. male.  Patient has a history of dementia.  He also has a recent UTI and is being treated with Cipro.  Patient came from a nursing home and they felt like he was more confused than normal.  The history is provided by the patient, medical records and the nursing home. No language interpreter was used.  Altered Mental Status Presenting symptoms: confusion   Severity:  Mild Most recent episode:  Today Episode history:  Single Timing:  Intermittent Progression:  Resolved Chronicity:  New Context: not alcohol use   Associated symptoms: no abdominal pain, no hallucinations, no headaches, no rash and no seizures        Home Medications Prior to Admission medications   Medication Sig Start Date End Date Taking? Authorizing Provider  acetaminophen  (TYLENOL ) 325 MG tablet Take by mouth. 12/28/22   [provider]  albuterol  (VENTOLIN  HFA) 108 (90 Base) MCG/ACT inhaler Inhale 1 puff into the lungs 4 (four) times daily as needed. 05/09/23   Rayfield Cairo, MD  Aloe Vera GEL Apply topically.    [provider]  benzonatate  (TESSALON ) 200 MG capsule Take 200 mg by mouth 2 (two) times daily as needed for cough. 08/17/22   [provider]  cholecalciferol  (VITAMIN D ) 1000 units tablet Take 1,000 Units by mouth daily.    [provider]  ciprofloxacin (CIPRO) 500 MG tablet Take 500 mg by mouth 2 (two) times daily. 06/19/23   [provider]  cycloSPORINE  (RESTASIS ) 0.05 % ophthalmic emulsion Place 1 drop into both eyes 2 (two) times daily. 08/18/22   [provider]  desonide (DESOWEN) 0.05 % cream Apply topically. 01/16/23   [provider]  docusate sodium  (COLACE) 100 MG capsule Take 100 mg by  mouth 2 (two) times daily.    [provider]  donepezil  (ARICEPT ) 10 MG tablet TAKE 1 TABLET BY MOUTH AT BEDTIME. 02/15/23   Alane Allen, Sara E, PA-C  FEROSUL 325 (65 Fe) MG tablet Take 325 mg by mouth daily with breakfast. 06/05/23   [provider]  GOODSENSE ARTHRITIS PAIN 1 % GEL Apply 2 g topically 4 (four) times daily. 05/07/23   [provider]  GOODSENSE ARTIFICIAL TEARS 0.5-0.6 % SOLN Apply to eye. 05/24/20   [provider]  ipratropium-albuterol  (DUONEB) 0.5-2.5 (3) MG/3ML SOLN Take 3 mLs by nebulization every 4 (four) hours as needed (wheezing, shortness of breath). 05/09/23   Olberding, Clanford L, MD  levothyroxine  (SYNTHROID ) 150 MCG tablet Take 150 mcg by mouth daily before breakfast.    [provider]  loperamide (IMODIUM) 2 MG capsule Take by mouth as needed for diarrhea or loose stools.    [provider]  melatonin 5 MG TABS Take 5 mg by mouth at bedtime.    [provider]  memantine  (NAMENDA ) 5 MG tablet Take 1 tablet (5 mg at night) for 2 weeks, then increase to 1 tablet (5 mg) twice a day 02/15/23   Wertman, Sara E, PA-C  MUCUS RELIEF 600 MG 12 hr tablet Take 600 mg by mouth 2 (two) times daily as needed for cough or to loosen phlegm. 05/25/23   [provider]  OXYGEN  Inhale 2 L into the lungs continuous. 04/24/20  [provider]  pantoprazole  (PROTONIX ) 40 MG tablet Take 1 tablet (40 mg total) by mouth daily for 14 days. 05/10/23 05/24/23  Bramble, Clanford L, MD  PARoxetine (PAXIL) 20 MG tablet Take 20 mg by mouth daily. 06/07/23   [provider]  predniSONE  (DELTASONE ) 10 MG tablet Take 10 mg by mouth with breakfast, with lunch, and with evening meal. 05/11/23   [provider]  Psyllium (METAMUCIL PO) Take by mouth. 1 capsule twice daily    [provider]  rosuvastatin  (CRESTOR ) 10 MG tablet Take 1 tablet (10 mg total) by mouth daily. 05/21/20   Marilyne Shu, NP  TRELEGY  ELLIPTA 200-62.5-25 MCG/ACT AEPB Inhale 1 puff into the lungs daily. 09/15/22   [provider]  vitamin B-12 (CYANOCOBALAMIN ) 1000 MCG tablet Take 1 tablet (1,000 mcg total) by mouth daily. 02/10/21   Katragadda, Sreedhar, MD      Allergies    Patient has no known allergies.    Review of Systems   Review of Systems  Constitutional:  Negative for appetite change and fatigue.  HENT:  Negative for congestion, ear discharge and sinus pressure.   Eyes:  Negative for discharge.  Respiratory:  Negative for cough.   Cardiovascular:  Negative for chest pain.  Gastrointestinal:  Negative for abdominal pain and diarrhea.  Genitourinary:  Negative for frequency and hematuria.  Musculoskeletal:  Negative for back pain.  Skin:  Negative for rash.  Neurological:  Negative for seizures and headaches.  Psychiatric/Behavioral:  Positive for confusion. Negative for hallucinations.     Physical Exam Updated Vital Signs BP (!) 155/79   Pulse (!) 56   Temp 98.4 F (36.9 C) (Oral)   Resp 16   Ht 5\' 9"  (1.753 m)   Wt 70.4 kg   SpO2 97%   BMI 22.92 kg/m  Physical Exam Vitals and nursing note reviewed.  Constitutional:      Appearance: He is well-developed.  HENT:     Head: Normocephalic.     Nose: Nose normal.  Eyes:     General: No scleral icterus.    Conjunctiva/sclera: Conjunctivae normal.  Neck:     Thyroid : No thyromegaly.  Cardiovascular:     Rate and Rhythm: Normal rate and regular rhythm.     Heart sounds: No murmur heard.    No friction rub. No gallop.  Pulmonary:     Breath sounds: No stridor. No wheezing or rales.  Chest:     Chest wall: No tenderness.  Abdominal:     General: There is no distension.     Tenderness: There is no abdominal tenderness. There is no rebound.  Musculoskeletal:        General: Normal range of motion.     Cervical back: Neck supple.  Lymphadenopathy:     Cervical: No cervical adenopathy.  Skin:    Findings: No erythema or rash.   Neurological:     Mental Status: He is alert and oriented to person, place, and time.     Motor: No abnormal muscle tone.     Coordination: Coordination normal.  Psychiatric:        Behavior: Behavior normal.     ED Results / Procedures / Treatments   Labs (all labs ordered are listed, but only abnormal results are displayed) Labs Reviewed  COMPREHENSIVE METABOLIC PANEL WITH GFR - Abnormal; Notable for the following components:      Result Value   Chloride 97 (*)    Glucose, Bld 111 (*)  Total Protein 6.4 (*)    Albumin 3.4 (*)    All other components within normal limits  URINALYSIS, ROUTINE W REFLEX MICROSCOPIC - Abnormal; Notable for the following components:   Color, Urine AMBER (*)    Ketones, ur 5 (*)    Protein, ur 100 (*)    All other components within normal limits  CBC  AMMONIA  ETHANOL  CBG MONITORING, ED    EKG None  Radiology CT Head Wo Contrast Result Date: 06/20/2023 CLINICAL DATA:  Memory loss EXAM: CT HEAD WITHOUT CONTRAST TECHNIQUE: Contiguous axial images were obtained from the base of the skull through the vertex without intravenous contrast. RADIATION DOSE REDUCTION: This exam was performed according to the departmental dose-optimization program which includes automated exposure control, adjustment of the mA and/or kV according to patient size and/or use of iterative reconstruction technique. COMPARISON:  CT head 01/10/2022 FINDINGS: Brain: Cerebral ventricle sizes are concordant with the degree of cerebral volume loss. Patchy and confluent areas of decreased attenuation are noted throughout the deep and periventricular white matter of the cerebral hemispheres bilaterally, compatible with chronic microvascular ischemic disease. No evidence of large-territorial acute infarction. No parenchymal hemorrhage. No mass lesion. No extra-axial collection. No mass effect or midline shift. No hydrocephalus. Basilar cisterns are patent. Vascular: No hyperdense vessel.  Atherosclerotic calcifications are present within the cavernous internal carotid arteries. Skull: No acute fracture or focal lesion. Sinuses/Orbits: Paranasal sinuses and mastoid air cells are clear. Bilateral lens replacement. Otherwise the orbits are unremarkable. Other: 2 mm left frontal scalp hematoma. IMPRESSION: No acute intracranial abnormality. Electronically Signed   By: Morgane  Naveau M.D.   On: 06/20/2023 18:22   DG Chest Port 1 View Result Date: 06/20/2023 CLINICAL DATA:  Shortness of breath EXAM: PORTABLE CHEST 1 VIEW COMPARISON:  May 08, 2023 FINDINGS: The heart size and mediastinal contours are within normal limits. Both lungs are clear. The visualized skeletal structures are unremarkable. No change in the position of the left subclavian Infusaport catheter IMPRESSION: No active disease. Electronically Signed   By: Fredrich Jefferson M.D.   On: 06/20/2023 16:50    Procedures Procedures    Medications Ordered in ED Medications - No data to display  ED Course/ Medical Decision Making/ A&P                                 Medical Decision Making Amount and/or Complexity of Data Reviewed Labs: ordered. Radiology: ordered.   Patient with dementia and mild confusion.  Labs unremarkable.  He will continue taking his antibiotic for his urinary tract infection.  CT scan of the head unremarkable        Final Clinical Impression(s) / ED Diagnoses Final diagnoses:  Acute cystitis without hematuria    Rx / DC Orders ED Discharge Orders     None         Cheyenne Cotta, MD 06/22/23 1244

## 2023-06-27 ENCOUNTER — Emergency Department (HOSPITAL_COMMUNITY)

## 2023-06-27 ENCOUNTER — Emergency Department (HOSPITAL_COMMUNITY)
Admission: EM | Admit: 2023-06-27 | Discharge: 2023-06-28 | Disposition: A | Discharge status: Skilled Nursing Facility | Source: Home / Self Care | Attending: Emergency Medicine | Admitting: Emergency Medicine

## 2023-06-27 DIAGNOSIS — R531 Weakness: Secondary | ICD-10-CM | POA: Insufficient documentation

## 2023-06-27 DIAGNOSIS — G319 Degenerative disease of nervous system, unspecified: Secondary | ICD-10-CM | POA: Diagnosis not present

## 2023-06-27 DIAGNOSIS — J449 Chronic obstructive pulmonary disease, unspecified: Secondary | ICD-10-CM | POA: Insufficient documentation

## 2023-06-27 DIAGNOSIS — R4182 Altered mental status, unspecified: Secondary | ICD-10-CM | POA: Diagnosis not present

## 2023-06-27 DIAGNOSIS — G919 Hydrocephalus, unspecified: Secondary | ICD-10-CM | POA: Diagnosis not present

## 2023-06-27 DIAGNOSIS — Z79899 Other long term (current) drug therapy: Secondary | ICD-10-CM | POA: Insufficient documentation

## 2023-06-27 DIAGNOSIS — I700 Atherosclerosis of aorta: Secondary | ICD-10-CM | POA: Diagnosis not present

## 2023-06-27 DIAGNOSIS — F0390 Unspecified dementia without behavioral disturbance: Secondary | ICD-10-CM | POA: Insufficient documentation

## 2023-06-27 DIAGNOSIS — E860 Dehydration: Secondary | ICD-10-CM | POA: Diagnosis not present

## 2023-06-27 DIAGNOSIS — I6782 Cerebral ischemia: Secondary | ICD-10-CM | POA: Diagnosis not present

## 2023-06-27 DIAGNOSIS — N400 Enlarged prostate without lower urinary tract symptoms: Secondary | ICD-10-CM | POA: Diagnosis not present

## 2023-06-27 DIAGNOSIS — N390 Urinary tract infection, site not specified: Secondary | ICD-10-CM | POA: Diagnosis not present

## 2023-06-27 DIAGNOSIS — R9089 Other abnormal findings on diagnostic imaging of central nervous system: Secondary | ICD-10-CM | POA: Diagnosis not present

## 2023-06-27 DIAGNOSIS — I10 Essential (primary) hypertension: Secondary | ICD-10-CM | POA: Insufficient documentation

## 2023-06-27 DIAGNOSIS — K8689 Other specified diseases of pancreas: Secondary | ICD-10-CM | POA: Diagnosis not present

## 2023-06-27 LAB — CBC WITH DIFFERENTIAL/PLATELET
Abs Immature Granulocytes: 0.02 10*3/uL (ref 0.00–0.07)
Basophils Absolute: 0 10*3/uL (ref 0.0–0.1)
Basophils Relative: 0 %
Eosinophils Absolute: 0 10*3/uL (ref 0.0–0.5)
Eosinophils Relative: 1 %
HCT: 41.3 % (ref 39.0–52.0)
Hemoglobin: 13.6 g/dL (ref 13.0–17.0)
Immature Granulocytes: 0 %
Lymphocytes Relative: 7 %
Lymphs Abs: 0.5 10*3/uL — ABNORMAL LOW (ref 0.7–4.0)
MCH: 31.3 pg (ref 26.0–34.0)
MCHC: 32.9 g/dL (ref 30.0–36.0)
MCV: 94.9 fL (ref 80.0–100.0)
Monocytes Absolute: 0.4 10*3/uL (ref 0.1–1.0)
Monocytes Relative: 6 %
Neutro Abs: 6.6 10*3/uL (ref 1.7–7.7)
Neutrophils Relative %: 86 %
Platelets: 245 10*3/uL (ref 150–400)
RBC: 4.35 MIL/uL (ref 4.22–5.81)
RDW: 14.3 % (ref 11.5–15.5)
WBC: 7.6 10*3/uL (ref 4.0–10.5)
nRBC: 0 % (ref 0.0–0.2)

## 2023-06-27 LAB — AMMONIA: Ammonia: 18 umol/L (ref 9–35)

## 2023-06-27 LAB — COMPREHENSIVE METABOLIC PANEL WITH GFR
ALT: 11 U/L (ref 0–44)
AST: 18 U/L (ref 15–41)
Albumin: 3.8 g/dL (ref 3.5–5.0)
Alkaline Phosphatase: 75 U/L (ref 38–126)
Anion gap: 11 (ref 5–15)
BUN: 19 mg/dL (ref 8–23)
CO2: 28 mmol/L (ref 22–32)
Calcium: 9.3 mg/dL (ref 8.9–10.3)
Chloride: 98 mmol/L (ref 98–111)
Creatinine, Ser: 0.93 mg/dL (ref 0.61–1.24)
GFR, Estimated: >60 mL/min (ref >60)
Glucose, Bld: 101 mg/dL — ABNORMAL HIGH (ref 70–99)
Potassium: 3.8 mmol/L (ref 3.5–5.1)
Sodium: 137 mmol/L (ref 135–145)
Total Bilirubin: 1.1 mg/dL (ref 0.0–1.2)
Total Protein: 6.9 g/dL (ref 6.5–8.1)

## 2023-06-27 LAB — URINALYSIS, W/ REFLEX TO CULTURE (INFECTION SUSPECTED)
Bacteria, UA: NONE SEEN
Bilirubin Urine: NEGATIVE
Glucose, UA: NEGATIVE mg/dL
Hgb urine dipstick: NEGATIVE
Ketones, ur: 20 mg/dL — AB
Leukocytes,Ua: NEGATIVE
Nitrite: NEGATIVE
Protein, ur: NEGATIVE mg/dL
Specific Gravity, Urine: 1.023 (ref 1.005–1.030)
pH: 6 (ref 5.0–8.0)

## 2023-06-27 LAB — TSH: TSH: 0.021 u[IU]/mL — ABNORMAL LOW (ref 0.350–4.500)

## 2023-06-27 LAB — BLOOD GAS, VENOUS
Acid-Base Excess: 7.5 mmol/L — ABNORMAL HIGH (ref 0.0–2.0)
Bicarbonate: 34.1 mmol/L — ABNORMAL HIGH (ref 20.0–28.0)
Drawn by: 7012
O2 Saturation: 69.6 %
Patient temperature: 36.4
pCO2, Ven: 54 mmHg (ref 44–60)
pH, Ven: 7.41 (ref 7.25–7.43)
pO2, Ven: 37 mmHg (ref 32–45)

## 2023-06-27 LAB — MAGNESIUM: Magnesium: 1.9 mg/dL (ref 1.7–2.4)

## 2023-06-27 LAB — CBG MONITORING, ED: Glucose-Capillary: 112 mg/dL — ABNORMAL HIGH (ref 70–99)

## 2023-06-27 MED ORDER — AMLODIPINE BESYLATE 5 MG PO TABS: 5.0000 mg | ORAL_TABLET | Freq: Every day | ORAL | 0 refills | Status: DC

## 2023-06-27 MED ORDER — DONEPEZIL HCL 5 MG PO TABS
5.0000 mg | ORAL_TABLET | Freq: Every day | ORAL | Status: DC
  Administered 2023-06-27: 5 mg via ORAL
  Filled 2023-06-27: qty 1

## 2023-06-27 MED ORDER — MEMANTINE HCL 10 MG PO TABS
5.0000 mg | ORAL_TABLET | Freq: Two times a day (BID) | ORAL | Status: DC
  Administered 2023-06-27: 5 mg via ORAL
  Filled 2023-06-27: qty 1

## 2023-06-27 MED ORDER — ALBUTEROL SULFATE HFA 108 (90 BASE) MCG/ACT IN AERS: 1.0000 | INHALATION_SPRAY | Freq: Four times a day (QID) | RESPIRATORY_TRACT | Status: DC | PRN

## 2023-06-27 MED ORDER — AMLODIPINE BESYLATE 5 MG PO TABS
5.0000 mg | ORAL_TABLET | Freq: Once | ORAL | Status: AC
  Administered 2023-06-27: 5 mg via ORAL
  Filled 2023-06-27: qty 1

## 2023-06-27 NOTE — Discharge Instructions (Addendum)
 Your test results did not show any clear reason why you have had weakness and hallucinations for the past 2 weeks.  This could be progression of dementia.  This could also be related to your current medications.  You should follow-up with your neurologist in the office to discuss further.   Your TSH is low.  This could indicate high thyroid  levels.  T3 and T4 levels were drawn but these results will not be available until tomorrow.  Follow-up on these results to determine if you need changes to your Synthroid  medication dose.  Additionally, your blood pressure was elevated while you were in the emergency department.  A prescription for low-dose blood pressure medication was sent to your pharmacy.  Take this and continue to have blood pressure checked at facility.  Discuss possible need for further blood pressure medication changes with your primary care doctor if you have consistently elevated readings.  Return to the emergency department for any new or worsening symptoms of concern.

## 2023-06-27 NOTE — ED Provider Notes (Signed)
 [place] EMERGENCY DEPARTMENT AT [facility] Provider Note   CSN: [id number] Arrival date & time: 06/27/23  1453     History  Chief Complaint  Patient presents with   Weakness    Ian Aguilar is a 83 y.o. male.  HPI Patient presents for generalized weakness.  Medical history includes HTN, HLD, COPD, cirrhosis, anxiety, depression, arthritis, alcohol abuse, dementia.  He was seen in the ED a week ago for confusion.  At the time, he was on antibiotics for UTI.  Per EMS, he had an abnormal lab result but they are not sure what.  Patient is not able to provide any history due to his dementia.  He denies any physical complaints at this time.  History per staff at nursing facility: Patient has had generalized weakness for the past 2 weeks.  He was previously able to assist with transfers.  He is no longer able to stand on his own power.  He also has been noted to be reaching at things in the air that are not there, concerning for hallucinations.  Facility states that he had his thyroid  levels checked 2 weeks ago.  Reportedly, this result came back recently and was abnormal.  They do state he underwent a change in his Synthroid  dosing recently.    Home Medications Prior to Admission medications   Medication Sig Start Date End Date Taking? Authorizing Provider  amLODipine  (NORVASC ) 5 MG tablet Take 1 tablet (5 mg total) by mouth daily. 06/27/23 07/27/23 Yes Iva Mariner, MD  acetaminophen  (TYLENOL ) 325 MG tablet Take by mouth. 12/28/22   [provider]  albuterol  (VENTOLIN  HFA) 108 (90 Base) MCG/ACT inhaler Inhale 1 puff into the lungs 4 (four) times daily as needed. 05/09/23   Rayfield Cairo, MD  Aloe Vera GEL Apply topically.    [provider]  benzonatate  (TESSALON ) 200 MG capsule Take 200 mg by mouth 2 (two) times daily as needed for cough. 08/17/22   [provider]  cholecalciferol  (VITAMIN D ) 1000 units tablet Take 1,000 Units by mouth daily.     [provider]  ciprofloxacin (CIPRO) 500 MG tablet Take 500 mg by mouth 2 (two) times daily. 06/19/23   [provider]  cycloSPORINE  (RESTASIS ) 0.05 % ophthalmic emulsion Place 1 drop into both eyes 2 (two) times daily. 08/18/22   [provider]  desonide (DESOWEN) 0.05 % cream Apply topically. 01/16/23   [provider]  docusate sodium  (COLACE) 100 MG capsule Take 100 mg by mouth 2 (two) times daily.    [provider]  donepezil  (ARICEPT ) 10 MG tablet TAKE 1 TABLET BY MOUTH AT BEDTIME. 02/15/23   Alane Allen, Sara E, PA-C  FEROSUL 325 (65 Fe) MG tablet Take 325 mg by mouth daily with breakfast. 06/05/23   [provider]  GOODSENSE ARTHRITIS PAIN 1 % GEL Apply 2 g topically 4 (four) times daily. 05/07/23   [provider]  GOODSENSE ARTIFICIAL TEARS 0.5-0.6 % SOLN Apply to eye. 05/24/20   [provider]  ipratropium-albuterol  (DUONEB) 0.5-2.5 (3) MG/3ML SOLN Take 3 mLs by nebulization every 4 (four) hours as needed (wheezing, shortness of breath). 05/09/23   Runde, Clanford L, MD  levothyroxine  (SYNTHROID ) 150 MCG tablet Take 150 mcg by mouth daily before breakfast.    [provider]  loperamide (IMODIUM) 2 MG capsule Take by mouth as needed for diarrhea or loose stools.    [provider]  melatonin 5 MG TABS Take 5 mg  by mouth at bedtime.    [provider]  memantine  (NAMENDA ) 5 MG tablet Take 1 tablet (5 mg at night) for 2 weeks, then increase to 1 tablet (5 mg) twice a day 02/15/23   Wertman, Sara E, PA-C  MUCUS RELIEF 600 MG 12 hr tablet Take 600 mg by mouth 2 (two) times daily as needed for cough or to loosen phlegm. 05/25/23   [provider]  OXYGEN  Inhale 2 L into the lungs continuous. 04/24/20   [provider]  pantoprazole  (PROTONIX ) 40 MG tablet Take 1 tablet (40 mg total) by mouth daily for 14 days. 05/10/23 05/24/23  Lucia, Clanford L, MD  PARoxetine (PAXIL) 20 MG  tablet Take 20 mg by mouth daily. 06/07/23   [provider]  predniSONE  (DELTASONE ) 10 MG tablet Take 10 mg by mouth with breakfast, with lunch, and with evening meal. 05/11/23   [provider]  Psyllium (METAMUCIL PO) Take by mouth. 1 capsule twice daily    [provider]  rosuvastatin  (CRESTOR ) 10 MG tablet Take 1 tablet (10 mg total) by mouth daily. 05/21/20   Marilyne Shu, NP  TRELEGY ELLIPTA 200-62.5-25 MCG/ACT AEPB Inhale 1 puff into the lungs daily. 09/15/22   [provider]  vitamin B-12 (CYANOCOBALAMIN ) 1000 MCG tablet Take 1 tablet (1,000 mcg total) by mouth daily. 02/10/21   Katragadda, Sreedhar, MD      Allergies    Patient has no known allergies.    Review of Systems   Review of Systems  Unable to perform ROS: Mental status change    Physical Exam Updated Vital Signs BP (!) 189/97 (BP Location: Right Arm)   Pulse 71   Temp 97.6 F (36.4 C) (Oral)   Resp 15   Ht 5\' 9"  (1.753 m)   Wt 70.3 kg   SpO2 96%   BMI 22.89 kg/m  Physical Exam Vitals and nursing note reviewed.  Constitutional:      General: He is not in acute distress.    Appearance: He is well-developed. He is ill-appearing (Chronically). He is not toxic-appearing or diaphoretic.  HENT:     Head: Normocephalic and atraumatic.     Right Ear: External ear normal.     Left Ear: External ear normal.     Nose: Nose normal.     Mouth/Throat:     Mouth: Mucous membranes are moist.  Eyes:     Extraocular Movements: Extraocular movements intact.     Conjunctiva/sclera: Conjunctivae normal.  Cardiovascular:     Rate and Rhythm: Normal rate and regular rhythm.     Heart sounds: No murmur heard. Pulmonary:     Effort: Pulmonary effort is normal. No respiratory distress.     Breath sounds: Normal breath sounds. No wheezing or rales.  Chest:     Chest wall: No tenderness.  Abdominal:     General: There is no distension.     Palpations: Abdomen is soft.     Tenderness:  There is no abdominal tenderness.  Musculoskeletal:        General: No swelling or deformity. Normal range of motion.     Cervical back: Normal range of motion and neck supple.     Right lower leg: No edema.     Left lower leg: No edema.  Skin:    General: Skin is warm and dry.     Coloration: Skin is not jaundiced or pale.  Neurological:     General: No focal deficit present.  Mental Status: He is alert. He is disoriented.  Psychiatric:        Mood and Affect: Mood normal.        Behavior: Behavior normal.     ED Results / Procedures / Treatments   Labs (all labs ordered are listed, but only abnormal results are displayed) Labs Reviewed  COMPREHENSIVE METABOLIC PANEL WITH GFR - Abnormal; Notable for the following components:      Result Value   Glucose, Bld 101 (*)    All other components within normal limits  CBC WITH DIFFERENTIAL/PLATELET - Abnormal; Notable for the following components:   Lymphs Abs 0.5 (*)    All other components within normal limits  BLOOD GAS, VENOUS - Abnormal; Notable for the following components:   Bicarbonate 34.1 (*)    Acid-Base Excess 7.5 (*)    All other components within normal limits  URINALYSIS, W/ REFLEX TO CULTURE (INFECTION SUSPECTED) - Abnormal; Notable for the following components:   Ketones, ur 20 (*)    All other components within normal limits  TSH - Abnormal; Notable for the following components:   TSH 0.021 (*)    All other components within normal limits  CBG MONITORING, ED - Abnormal; Notable for the following components:   Glucose-Capillary 112 (*)    All other components within normal limits  AMMONIA  MAGNESIUM   T3, FREE  T4, FREE    EKG None  Radiology MR BRAIN WO CONTRAST Result Date: 06/27/2023 CLINICAL DATA:  Initial evaluation for acute neuro deficit, stroke suspected. EXAM: MRI HEAD WITHOUT CONTRAST TECHNIQUE: Multiplanar, multiecho pulse sequences of the brain and surrounding structures were obtained  without intravenous contrast. COMPARISON:  Comparison made with CT from earlier the same day. FINDINGS: Brain: Examination markedly limited due to extensive susceptibility artifact emanating from the region of the right ear. Additionally, examination is markedly degraded by motion. Diffuse prominence of the CSF containing spaces compatible generalized cerebral atrophy. Patchy T2/FLAIR hyperintensity involving the periventricular deep white matter both cerebral hemispheres, most characteristic of chronic micro vessel ischemic disease, mild for age. No visible foci of restricted diffusion to suggest acute or subacute ischemia. No visible areas of chronic cortical infarction. No visible acute or chronic intracranial blood products. No visible mass lesion, mass effect or midline shift. Ventricular prominence related global parenchymal volume loss of hydrocephalus. No extra-axial fluid collection. Pituitary gland and suprasellar region within normal limits. Vascular: Major intracranial vascular flow voids are grossly maintained at the skull base. Skull and upper cervical spine: Cranial junctional limits. Bone marrow signal intensity normal. No scalp soft tissue abnormality. Sinuses/Orbits: Prior bilateral ocular lens replacement. Paranasal sinuses are largely clear. No visible mastoid effusion. Effusion, although the right mastoid air cells are largely obscured. Other: None. IMPRESSION: 1. Technically limited exam due to extensive susceptibility artifact emanating from the region of the right ear. Additionally, examination is markedly degraded by motion. 2. No acute intracranial abnormality. 3. 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 Electronically Signed   By: Virgia Griffins M.D.   On: 06/27/2023 20:49   CT HEAD WO CONTRAST Result Date: 06/27/2023 CLINICAL DATA:  Mental status change of unknown cause. Visual disturbance. EXAM: CT HEAD WITHOUT CONTRAST TECHNIQUE: Contiguous  axial images were obtained from the base of the skull through the vertex without intravenous contrast. RADIATION DOSE REDUCTION: This exam was performed according to the departmental dose-optimization program which includes automated exposure control, adjustment of the mA and/or kV according to patient size and/or use  of iterative reconstruction technique. COMPARISON:  Head CT 1 week ago. FINDINGS: Brain: Motion degraded study. No acute finding. Age related volume loss with some temporal lobe predominance. Chronic small-vessel ischemic changes of the white matter. No acute stroke, mass, hemorrhage, hydrocephalus or extra-axial collection. Vascular: No acute vascular finding. Skull: Negative Sinuses/Orbits: Clear/normal Other: Small chronic mastoid effusion on the right. IMPRESSION: Motion degraded study. No acute finding. Age related volume loss with some temporal lobe predominance. Chronic small-vessel ischemic changes of the white matter. Electronically Signed   By: Bettylou Brunner M.D.   On: 06/27/2023 16:09   DG Chest Port 1 View Result Date: 06/27/2023 CLINICAL DATA:  Weakness. EXAM: PORTABLE CHEST 1 VIEW COMPARISON:  06/20/2023. FINDINGS: Patient is rotated to the left. Stable cardiomediastinal silhouette. Aortic atherosclerosis. Stable left subclavian Port-A-Cath. No focal consolidation, pleural effusion, or pneumothorax. No acute osseous abnormality. IMPRESSION: No acute cardiopulmonary findings. Electronically Signed   By: Mannie Seek M.D.   On: 06/27/2023 15:56    Procedures Procedures    Medications Ordered in ED Medications  albuterol  (VENTOLIN  HFA) 108 (90 Base) MCG/ACT inhaler 1 puff (has no administration in time range)  donepezil  (ARICEPT ) tablet 5 mg (5 mg Oral Given 06/27/23 2239)  memantine  (NAMENDA ) tablet 5 mg (5 mg Oral Given 06/27/23 2239)  amLODipine  (NORVASC ) tablet 5 mg (has no administration in time range)    ED Course/ Medical Decision Making/ A&P                                  Medical Decision Making Amount and/or Complexity of Data Reviewed Labs: ordered. Radiology: ordered.  Risk Prescription drug management.   This patient presents to the ED for concern of generalized weakness, hallucinations, this involves an extensive number of treatment options, and is a complaint that carries with it a high risk of complications and morbidity.  The differential diagnosis includes infection, metabolic derangements, deconditioning, progression of neurocognitive disorder, polypharmacy   Co morbidities that complicate the patient evaluation  HTN, HLD, COPD, cirrhosis, anxiety, depression, arthritis, alcohol abuse, dementia   Additional history obtained:  Additional history obtained from staff at nursing facility External records from outside source obtained and reviewed including EMR   Lab Tests:  I Ordered, and personally interpreted labs.  The pertinent results include: 1, no leukocytosis, normal ammonia, no evidence of UTI, normal kidney function, normal electrolytes.  TSH is low.  T3 and T4 were pending at time of discharge.   Imaging Studies ordered:  I ordered imaging studies including x-ray, CT head, MRI brain I independently visualized and interpreted imaging which showed no acute findings I agree with the radiologist interpretation   Cardiac Monitoring: / EKG:  The patient was maintained on a cardiac monitor.  I personally viewed and interpreted the cardiac monitored which showed an underlying rhythm of: Sinus rhythm   Problem List / ED Course / Critical interventions / Medication management  Patient presenting for very vague descriptions of generalized weakness and possible hallucinations over the past 2 weeks.  On arrival in the ED, he has no complaints.  He is disoriented.  He does not know why he is here.  He has good strength in all extremities.  He does have a tremor or possibly even asterixis in his upper extremities.  He has  difficult time following commands for exam.  He has no areas of tenderness.  His breathing is unlabored.  I reach out  to his nursing facility for more information.  They state that he had a televisit with a doctor today through [facility] that simply asked for him to come to the ER.  Broad workup was initiated.  Serum lab work was notable for a low TSH.  Patient is currently on Synthroid .  T3 and T4 levels were ordered.  These labs have a slow turnaround.  Changes to his Synthroid  based on results can be managed by Dr. at his nursing facility.  Patient underwent CT and MRI brain.  MRI results were limited due to presence of an implant.  Notably, no acute findings were identified.  Patient's urinalysis showed no evidence of infection.  Patient remained pleasantly demented while in the ED.  His blood pressure was elevated.  He does not appear that he is on any blood pressure medications.  He was given a dose of amlodipine .  Prescription was provided.  Patient was discharged in stable condition. I ordered medication including amlodipine  for HTN; home nighttime medications Reevaluation of the patient after these medicines showed that the patient improved I have reviewed the patients home medicines and have made adjustments as needed  Social Determinants of Health:  Resides in nursing facility        Final Clinical Impression(s) / ED Diagnoses Final diagnoses:  Generalized weakness    Rx / DC Orders ED Discharge Orders          Ordered    amLODipine  (NORVASC ) 5 MG tablet  Daily        06/27/23 2249              Iva Mariner, MD 06/27/23 2250

## 2023-06-27 NOTE — ED Triage Notes (Signed)
 Patient arrived from [facility] via EMS, [facility] reports patients thyroid  blood work levels were abnormal and he reported to them that he was seeing floaters and swatting at them. Patient normally able to ambulate with the use of his walker and today he is unable to ambulate and is having difficulty standing, weakness noted.

## 2023-06-28 ENCOUNTER — Emergency Department (HOSPITAL_COMMUNITY)

## 2023-06-28 ENCOUNTER — Other Ambulatory Visit: Payer: Self-pay

## 2023-06-28 ENCOUNTER — Inpatient Hospital Stay (HOSPITAL_COMMUNITY)
Admission: EM | Admit: 2023-06-28 | Discharge: 2023-07-04 | DRG: 640 | Disposition: A | Discharge status: Skilled Nursing Facility | Source: Skilled Nursing Facility | Attending: Internal Medicine | Admitting: Internal Medicine

## 2023-06-28 ENCOUNTER — Encounter (HOSPITAL_COMMUNITY): Payer: Self-pay

## 2023-06-28 DIAGNOSIS — Z8249 Family history of ischemic heart disease and other diseases of the circulatory system: Secondary | ICD-10-CM

## 2023-06-28 DIAGNOSIS — K862 Cyst of pancreas: Secondary | ICD-10-CM

## 2023-06-28 DIAGNOSIS — R4182 Altered mental status, unspecified: Secondary | ICD-10-CM | POA: Diagnosis not present

## 2023-06-28 DIAGNOSIS — R338 Other retention of urine: Secondary | ICD-10-CM | POA: Diagnosis not present

## 2023-06-28 DIAGNOSIS — Z85118 Personal history of other malignant neoplasm of bronchus and lung: Secondary | ICD-10-CM

## 2023-06-28 DIAGNOSIS — K7030 Alcoholic cirrhosis of liver without ascites: Secondary | ICD-10-CM | POA: Diagnosis not present

## 2023-06-28 DIAGNOSIS — M6281 Muscle weakness (generalized): Secondary | ICD-10-CM | POA: Diagnosis not present

## 2023-06-28 DIAGNOSIS — J449 Chronic obstructive pulmonary disease, unspecified: Secondary | ICD-10-CM | POA: Diagnosis not present

## 2023-06-28 DIAGNOSIS — L8990 Pressure ulcer of unspecified site, unspecified stage: Secondary | ICD-10-CM | POA: Insufficient documentation

## 2023-06-28 DIAGNOSIS — Z79899 Other long term (current) drug therapy: Secondary | ICD-10-CM | POA: Diagnosis not present

## 2023-06-28 DIAGNOSIS — R531 Weakness: Secondary | ICD-10-CM

## 2023-06-28 DIAGNOSIS — F0393 Unspecified dementia, unspecified severity, with mood disturbance: Secondary | ICD-10-CM | POA: Diagnosis present

## 2023-06-28 DIAGNOSIS — F0390 Unspecified dementia without behavioral disturbance: Secondary | ICD-10-CM | POA: Diagnosis not present

## 2023-06-28 DIAGNOSIS — E440 Moderate protein-calorie malnutrition: Secondary | ICD-10-CM | POA: Diagnosis not present

## 2023-06-28 DIAGNOSIS — N401 Enlarged prostate with lower urinary tract symptoms: Secondary | ICD-10-CM | POA: Diagnosis present

## 2023-06-28 DIAGNOSIS — Z79890 Hormone replacement therapy (postmenopausal): Secondary | ICD-10-CM

## 2023-06-28 DIAGNOSIS — R41841 Cognitive communication deficit: Secondary | ICD-10-CM | POA: Diagnosis not present

## 2023-06-28 DIAGNOSIS — Z743 Need for continuous supervision: Secondary | ICD-10-CM | POA: Diagnosis not present

## 2023-06-28 DIAGNOSIS — I2510 Atherosclerotic heart disease of native coronary artery without angina pectoris: Secondary | ICD-10-CM | POA: Diagnosis present

## 2023-06-28 DIAGNOSIS — K838 Other specified diseases of biliary tract: Secondary | ICD-10-CM | POA: Diagnosis present

## 2023-06-28 DIAGNOSIS — R278 Other lack of coordination: Secondary | ICD-10-CM | POA: Diagnosis not present

## 2023-06-28 DIAGNOSIS — R310 Gross hematuria: Secondary | ICD-10-CM | POA: Diagnosis not present

## 2023-06-28 DIAGNOSIS — E782 Mixed hyperlipidemia: Secondary | ICD-10-CM | POA: Diagnosis present

## 2023-06-28 DIAGNOSIS — E039 Hypothyroidism, unspecified: Secondary | ICD-10-CM

## 2023-06-28 DIAGNOSIS — Z7189 Other specified counseling: Secondary | ICD-10-CM | POA: Diagnosis not present

## 2023-06-28 DIAGNOSIS — I10 Essential (primary) hypertension: Secondary | ICD-10-CM | POA: Diagnosis present

## 2023-06-28 DIAGNOSIS — Z96642 Presence of left artificial hip joint: Secondary | ICD-10-CM | POA: Diagnosis present

## 2023-06-28 DIAGNOSIS — H04123 Dry eye syndrome of bilateral lacrimal glands: Secondary | ICD-10-CM | POA: Diagnosis not present

## 2023-06-28 DIAGNOSIS — N400 Enlarged prostate without lower urinary tract symptoms: Secondary | ICD-10-CM | POA: Diagnosis not present

## 2023-06-28 DIAGNOSIS — R1312 Dysphagia, oropharyngeal phase: Secondary | ICD-10-CM | POA: Diagnosis not present

## 2023-06-28 DIAGNOSIS — F03918 Unspecified dementia, unspecified severity, with other behavioral disturbance: Secondary | ICD-10-CM | POA: Diagnosis not present

## 2023-06-28 DIAGNOSIS — R339 Retention of urine, unspecified: Secondary | ICD-10-CM | POA: Diagnosis not present

## 2023-06-28 DIAGNOSIS — K7689 Other specified diseases of liver: Secondary | ICD-10-CM | POA: Diagnosis not present

## 2023-06-28 DIAGNOSIS — R627 Adult failure to thrive: Secondary | ICD-10-CM | POA: Diagnosis not present

## 2023-06-28 DIAGNOSIS — Z823 Family history of stroke: Secondary | ICD-10-CM

## 2023-06-28 DIAGNOSIS — R410 Disorientation, unspecified: Secondary | ICD-10-CM | POA: Diagnosis not present

## 2023-06-28 DIAGNOSIS — I700 Atherosclerosis of aorta: Secondary | ICD-10-CM | POA: Diagnosis not present

## 2023-06-28 DIAGNOSIS — J441 Chronic obstructive pulmonary disease with (acute) exacerbation: Secondary | ICD-10-CM | POA: Diagnosis not present

## 2023-06-28 DIAGNOSIS — F1010 Alcohol abuse, uncomplicated: Secondary | ICD-10-CM | POA: Diagnosis not present

## 2023-06-28 DIAGNOSIS — K8689 Other specified diseases of pancreas: Secondary | ICD-10-CM | POA: Diagnosis not present

## 2023-06-28 DIAGNOSIS — J438 Other emphysema: Secondary | ICD-10-CM | POA: Diagnosis not present

## 2023-06-28 DIAGNOSIS — E860 Dehydration: Secondary | ICD-10-CM | POA: Diagnosis not present

## 2023-06-28 DIAGNOSIS — N138 Other obstructive and reflux uropathy: Secondary | ICD-10-CM | POA: Diagnosis not present

## 2023-06-28 DIAGNOSIS — Z1612 Extended spectrum beta lactamase (ESBL) resistance: Secondary | ICD-10-CM | POA: Diagnosis not present

## 2023-06-28 DIAGNOSIS — C249 Malignant neoplasm of biliary tract, unspecified: Secondary | ICD-10-CM | POA: Diagnosis not present

## 2023-06-28 DIAGNOSIS — Z9221 Personal history of antineoplastic chemotherapy: Secondary | ICD-10-CM

## 2023-06-28 DIAGNOSIS — F0394 Unspecified dementia, unspecified severity, with anxiety: Secondary | ICD-10-CM | POA: Diagnosis not present

## 2023-06-28 DIAGNOSIS — Z87891 Personal history of nicotine dependence: Secondary | ICD-10-CM

## 2023-06-28 DIAGNOSIS — W19XXXA Unspecified fall, initial encounter: Secondary | ICD-10-CM | POA: Diagnosis present

## 2023-06-28 DIAGNOSIS — Z515 Encounter for palliative care: Secondary | ICD-10-CM | POA: Diagnosis not present

## 2023-06-28 DIAGNOSIS — Z7951 Long term (current) use of inhaled steroids: Secondary | ICD-10-CM

## 2023-06-28 DIAGNOSIS — F32A Depression, unspecified: Secondary | ICD-10-CM | POA: Diagnosis not present

## 2023-06-28 DIAGNOSIS — Z7952 Long term (current) use of systemic steroids: Secondary | ICD-10-CM

## 2023-06-28 DIAGNOSIS — G9341 Metabolic encephalopathy: Secondary | ICD-10-CM | POA: Diagnosis not present

## 2023-06-28 DIAGNOSIS — R5381 Other malaise: Secondary | ICD-10-CM | POA: Diagnosis not present

## 2023-06-28 DIAGNOSIS — R404 Transient alteration of awareness: Secondary | ICD-10-CM | POA: Diagnosis not present

## 2023-06-28 DIAGNOSIS — E441 Mild protein-calorie malnutrition: Secondary | ICD-10-CM | POA: Diagnosis not present

## 2023-06-28 DIAGNOSIS — R932 Abnormal findings on diagnostic imaging of liver and biliary tract: Secondary | ICD-10-CM | POA: Diagnosis not present

## 2023-06-28 DIAGNOSIS — Z66 Do not resuscitate: Secondary | ICD-10-CM | POA: Diagnosis present

## 2023-06-28 DIAGNOSIS — C3490 Malignant neoplasm of unspecified part of unspecified bronchus or lung: Secondary | ICD-10-CM | POA: Diagnosis not present

## 2023-06-28 DIAGNOSIS — M6289 Other specified disorders of muscle: Secondary | ICD-10-CM | POA: Diagnosis not present

## 2023-06-28 DIAGNOSIS — J99 Respiratory disorders in diseases classified elsewhere: Secondary | ICD-10-CM | POA: Diagnosis not present

## 2023-06-28 DIAGNOSIS — J440 Chronic obstructive pulmonary disease with acute lower respiratory infection: Secondary | ICD-10-CM | POA: Diagnosis not present

## 2023-06-28 DIAGNOSIS — N320 Bladder-neck obstruction: Secondary | ICD-10-CM | POA: Diagnosis present

## 2023-06-28 LAB — CBC WITH DIFFERENTIAL/PLATELET
Abs Immature Granulocytes: 0.02 10*3/uL (ref 0.00–0.07)
Basophils Absolute: 0 10*3/uL (ref 0.0–0.1)
Basophils Relative: 0 %
Eosinophils Absolute: 0 10*3/uL (ref 0.0–0.5)
Eosinophils Relative: 0 %
HCT: 42.5 % (ref 39.0–52.0)
Hemoglobin: 14.4 g/dL (ref 13.0–17.0)
Immature Granulocytes: 0 %
Lymphocytes Relative: 5 %
Lymphs Abs: 0.5 10*3/uL — ABNORMAL LOW (ref 0.7–4.0)
MCH: 31.6 pg (ref 26.0–34.0)
MCHC: 33.9 g/dL (ref 30.0–36.0)
MCV: 93.4 fL (ref 80.0–100.0)
Monocytes Absolute: 0.6 10*3/uL (ref 0.1–1.0)
Monocytes Relative: 6 %
Neutro Abs: 9 10*3/uL — ABNORMAL HIGH (ref 1.7–7.7)
Neutrophils Relative %: 89 %
Platelets: 244 10*3/uL (ref 150–400)
RBC: 4.55 MIL/uL (ref 4.22–5.81)
RDW: 14.1 % (ref 11.5–15.5)
WBC: 10.1 10*3/uL (ref 4.0–10.5)
nRBC: 0 % (ref 0.0–0.2)

## 2023-06-28 LAB — URINALYSIS, ROUTINE W REFLEX MICROSCOPIC
Bacteria, UA: NONE SEEN
Bilirubin Urine: NEGATIVE
Glucose, UA: NEGATIVE mg/dL
Hgb urine dipstick: NEGATIVE
Ketones, ur: 20 mg/dL — AB
Leukocytes,Ua: NEGATIVE
Nitrite: NEGATIVE
Protein, ur: 100 mg/dL — AB
Specific Gravity, Urine: >1.046 — ABNORMAL HIGH (ref 1.005–1.030)
pH: 5 (ref 5.0–8.0)

## 2023-06-28 LAB — COMPREHENSIVE METABOLIC PANEL WITH GFR
ALT: 13 U/L (ref 0–44)
AST: 24 U/L (ref 15–41)
Albumin: 3.6 g/dL (ref 3.5–5.0)
Alkaline Phosphatase: 74 U/L (ref 38–126)
Anion gap: 12 (ref 5–15)
BUN: 17 mg/dL (ref 8–23)
CO2: 25 mmol/L (ref 22–32)
Calcium: 9.1 mg/dL (ref 8.9–10.3)
Chloride: 96 mmol/L — ABNORMAL LOW (ref 98–111)
Creatinine, Ser: 0.86 mg/dL (ref 0.61–1.24)
GFR, Estimated: >60 mL/min (ref >60)
Glucose, Bld: 101 mg/dL — ABNORMAL HIGH (ref 70–99)
Potassium: 4 mmol/L (ref 3.5–5.1)
Sodium: 133 mmol/L — ABNORMAL LOW (ref 135–145)
Total Bilirubin: 1.6 mg/dL — ABNORMAL HIGH (ref 0.0–1.2)
Total Protein: 6.8 g/dL (ref 6.5–8.1)

## 2023-06-28 LAB — TROPONIN I (HIGH SENSITIVITY)
Troponin I (High Sensitivity): 15 ng/L (ref <18)
Troponin I (High Sensitivity): 16 ng/L (ref <18)

## 2023-06-28 LAB — MAGNESIUM: Magnesium: 1.9 mg/dL (ref 1.7–2.4)

## 2023-06-28 LAB — T4, FREE: Free T4: 1.99 ng/dL — ABNORMAL HIGH (ref 0.61–1.12)

## 2023-06-28 MED ORDER — LACTATED RINGERS IV BOLUS
1000.0000 mL | Freq: Once | INTRAVENOUS | Status: AC
  Administered 2023-06-28: 1000 mL via INTRAVENOUS

## 2023-06-28 MED ORDER — IOHEXOL 300 MG/ML  SOLN
100.0000 mL | Freq: Once | INTRAMUSCULAR | Status: AC | PRN
  Administered 2023-06-28: 100 mL via INTRAVENOUS

## 2023-06-28 NOTE — ED Triage Notes (Signed)
 Pt arrives via EMS from [facility]. Staff reports patient is not eating. VS with EMS 135/70, 80 HR, 15 RR, 2L [place] (chronic), 131 CBG. Pt alert and oriented to self only.

## 2023-06-28 NOTE — H&P (Signed)
 History and Physical    Patient: Ian Aguilar NWG:[id number] DOB: 09-21-1940 DOA: 06/28/2023 DOS: the patient was seen and examined on 06/29/2023 PCP: [name], MD  Patient coming from: [facility]F [facility] facility)  Chief Complaint:  Chief Complaint  Patient presents with   Failure To Thrive   HPI: BRONTE Aguilar is a 83 y.o. male with medical history significant of COPD, osteoarthritis of hip who presents to the emergency department via EMS from [facility] facility.  Patient was unable to provide any history possibly due to altered mental status or possible underlying dementia, history was obtained from ED physician and ED medical record.  Per report, patient patient has not been eating.  Son reported that patient was coherent 2 days ago (Tuesday 4/29), but is now altered.   He presented to the emergency department yesterday due to generalized weakness that was ongoing for about 2 weeks PTA.  He was previously able to assist with transfers, however, he was no longer able to stand on his own without assist and has also been reaching at things in the air that are not there, thereby raising concern for hallucinations.  Thyroid  levels checked about 2 weeks ago was noted to be abnormal, it was reported that he underwent a change in his Synthroid  dosing recently.  ED Course:  In the emergency department, he was hemodynamically stable.  Workup in the ED showed normal CBC and BMP except for sodium of 133, chloride 96, blood glucose 1 1.  Troponin x 2 was normal, urinalysis was normal, magnesium  1.9, TSH 0.021, free T4 - 1.99. CT head without contrast showed no acute finding MRI head without contrast showed no acute intracranial abnormality CT abdomen and pelvis with contrast showed advanced hepatic cirrhosis.  Progressive asymmetric moderate intrahepatic biliary ductal dilation  involving the left biliary tree to the level of the confluence of the hepatic ducts with relative decompression of the right  and common duct. Chest x-ray showed no acute findings IV LR 1 L was provided.  Hospitalist was asked to admit patient for further evaluation and management.   Review of Systems: Review of systems as noted in the HPI. All other systems reviewed and are negative.   Past Medical History:  Diagnosis Date   Anxiety    Arthritis    Cancer (HCC)    Cirrhosis (HCC)    confirmed by MRI on 07/04/11.     COPD (chronic obstructive pulmonary disease) (HCC)    Depression with anxiety    ETOH abuse    Hyperlipidemia    Hypertension    diet control   Nodule of upper lobe of right lung    with mediastinal adenopathy   Wears dentures    Wears glasses    Past Surgical History:  Procedure Laterality Date   bilateral cataract surgery     [place]   broken arm  6 yrs ago   left otif of wrist-[place]   CLOSED REDUCTION WRIST FRACTURE Right 09/02/2015   Procedure: RIGHT WRIST REDUCTION;  Surgeon: Saundra Curl, MD;  Location: [facility] OR;  Service: Orthopedics;  Laterality: Right;  with MAC   COLONOSCOPY  1996   [place]: external hemorrhoids, no polyps   COLONOSCOPY  06/28/2011   Dr. Rudolpho Costa diverticulosis,tubular adenoma, hyperplastic polyp   COLONOSCOPY WITH PROPOFOL  N/A 04/26/2017   Dr. Riley Cheadle: perianal and digital rectal examinations normal, diffusely congested colonic mucosa but otherwise normal   ESOPHAGOGASTRODUODENOSCOPY  06/28/2011   Dr. Cleora Daft erosive reflux esophagitis, hiatal hernia-gastritis  ESOPHAGOGASTRODUODENOSCOPY (EGD) WITH PROPOFOL  N/A 04/26/2017   Dr. Riley Cheadle: normal esophagus, small hiatal hernia, GAVE, portal hypertensive gastropathy, normal duodenal bulb and second portion, no specimens collected. 2 years screening    ESOPHAGOGASTRODUODENOSCOPY (EGD) WITH PROPOFOL  N/A 06/19/2019   normal esophagus, portal gastropathy, GAVE, normal duodenum. 2 years screening.    EXTERNAL EAR SURGERY     right ear-cleaned out ear and created new eardrum   INGUINAL HERNIA REPAIR  2-3 yrs  ago   left-[place]   INTRAMEDULLARY (IM) NAIL INTERTROCHANTERIC Right 09/02/2015   Procedure: RIGHT  INTERTROCHANTRIC HIP;  Surgeon: Saundra Curl, MD;  Location: [facility] OR;  Service: Orthopedics;  Laterality: Right;  With MAC   KNEE SURGERY     left-arthroscopy-[place]   PORTACATH PLACEMENT Left 06/08/2017   Procedure: INSERTION POWER PORT WITH  ATTACHED CATHETER LEFT SUBCLAVIAN;  Surgeon: Alanda Allegra, MD;  Location: [facility] ORS;  Service: General;  Laterality: Left;   SKULL FRACTURE ELEVATION     fractured cheeck bone repaired   TOTAL HIP ARTHROPLASTY  12/18/2011   Procedure: TOTAL HIP ARTHROPLASTY;  Surgeon: Darrin Emerald, MD;  Location: [facility] ORS;  Service: Orthopedics;  Laterality: Left;   VIDEO BRONCHOSCOPY WITH ENDOBRONCHIAL ULTRASOUND N/A 05/04/2017   Procedure: VIDEO BRONCHOSCOPY WITH ENDOBRONCHIAL ULTRASOUND;  Surgeon: Zelphia Higashi, MD;  [facility] OR;  Service: Thoracic;  Laterality: N/A;   WISDOM TOOTH EXTRACTION      Social History:  reports that he quit smoking about 66 years ago. His smoking use included cigarettes. He has never used smokeless tobacco. He reports that he does not currently use alcohol after a past usage of about 2.0 standard drinks of alcohol per week. He reports that he does not use drugs.   No Known Allergies  Family History  Problem Relation Age of Onset   Stroke Mother 3   Heart disease Father 43       MI   Diabetes Maternal Uncle    Colon cancer Neg Hx    Cancer Neg Hx      Prior to Admission medications   Medication Sig Start Date End Date Taking? Authorizing Provider  acetaminophen  (TYLENOL ) 325 MG tablet Take 325 mg by mouth every 4 (four) hours as needed for mild pain (pain score 1-3). 12/28/22  Yes [provider]  albuterol  (VENTOLIN  HFA) 108 (90 Base) MCG/ACT inhaler Inhale 1 puff into the lungs 4 (four) times daily as needed. Patient taking differently: Inhale 1 puff into the lungs 4 (four) times daily as needed for  shortness of breath or wheezing. 05/09/23  Yes Subramanian, Clanford L, MD  Aloe Vera GEL Apply 1 Application topically in the morning, at noon, and at bedtime. Apply to nasal carriage   Yes [provider]  benzonatate  (TESSALON ) 200 MG capsule Take 200 mg by mouth 3 (three) times daily as needed for cough. 08/17/22  Yes [provider]  cholecalciferol  (VITAMIN D ) 1000 units tablet Take 1,000 Units by mouth daily.   Yes [provider]  cycloSPORINE  (RESTASIS ) 0.05 % ophthalmic emulsion Place 1 drop into both eyes 2 (two) times daily. 08/18/22  Yes [provider]  desonide (DESOWEN) 0.05 % cream Apply 1 Application topically 2 (two) times daily as needed (itching). Apply to face, scalp, and ears 01/16/23  Yes [provider]  diclofenac  Sodium (VOLTAREN ) 1 % GEL Apply 2 g topically 4 (four) times daily as needed (pain). Apply to left lateral ankle   Yes [provider]  docusate sodium  (  COLACE) 100 MG capsule Take 100 mg by mouth 2 (two) times daily.   Yes [provider]  donepezil  (ARICEPT ) 10 MG tablet TAKE 1 TABLET BY MOUTH AT BEDTIME. 02/15/23  Yes Wertman, Sara E, PA-C  FEROSUL 325 (65 Fe) MG tablet Take 325 mg by mouth daily with breakfast. 06/05/23  Yes [provider]  GOODSENSE ARTIFICIAL TEARS 0.5-0.6 % SOLN Place 2 drops into both eyes every 8 (eight) hours as needed (dry eyes). 05/24/20  Yes [provider]  ipratropium-albuterol  (DUONEB) 0.5-2.5 (3) MG/3ML SOLN Take 3 mLs by nebulization every 4 (four) hours as needed (wheezing, shortness of breath). 05/09/23  Yes Colegrove, Clanford L, MD  Lactobacillus-Inulin (CULTURELLE DIGESTIVE DAILY PO) Take 1 capsule by mouth daily.   Yes [provider]  levothyroxine  (SYNTHROID ) 100 MCG tablet Take 100 mcg by mouth daily. 06/25/23  Yes [provider]  loperamide (IMODIUM) 2 MG capsule Take 4 mg by mouth as needed for diarrhea or loose stools. Take 2 capsules  after 1st loose stool, followed by 1 capsules after each subsequent loose stool. Do not exceed over 16 mg in a 24 hour period.   Yes [provider]  melatonin 5 MG TABS Take 5 mg by mouth at bedtime.   Yes [provider]  memantine  (NAMENDA ) 5 MG tablet Take 1 tablet (5 mg at night) for 2 weeks, then increase to 1 tablet (5 mg) twice a day Patient taking differently: Take 5 mg by mouth 2 (two) times daily. 02/15/23  Yes Wertman, Sara E, PA-C  MUCUS RELIEF 600 MG 12 hr tablet Take 600 mg by mouth 2 (two) times daily as needed for cough. 05/25/23  Yes [provider]  OXYGEN  Inhale 2 L into the lungs continuous. 04/24/20  Yes [provider]  PARoxetine (PAXIL) 20 MG tablet Take 20 mg by mouth daily. 06/07/23  Yes [provider]  Psyllium (METAMUCIL PO) Take 1 capsule by mouth in the morning and at bedtime.   Yes [provider]  rosuvastatin  (CRESTOR ) 10 MG tablet Take 1 tablet (10 mg total) by mouth daily. 05/21/20  Yes Marilyne Shu, NP  TRELEGY ELLIPTA 200-62.5-25 MCG/ACT AEPB Inhale 1 puff into the lungs daily. Rinse mouth after use 09/15/22  Yes [provider]  vitamin B-12 (CYANOCOBALAMIN ) 1000 MCG tablet Take 1 tablet (1,000 mcg total) by mouth daily. 02/10/21  Yes Paulett Boros, MD  pantoprazole  (PROTONIX ) 40 MG tablet Take 1 tablet (40 mg total) by mouth daily for 14 days. Patient not taking: Reported on 06/28/2023 05/10/23 05/24/23  Rayfield Cairo, MD    Physical Exam: BP (!) 150/97   Pulse 77   Temp 98.3 F (36.8 C) (Oral)   Resp 17   SpO2 97%   General: 83 y.o. year-old male chronically ill-appearing, but in no acute distress.   HEENT: NCAT, EOMI Neck: Supple, trachea medial Cardiovascular: Regular rate and rhythm with no rubs or gallops.  No thyromegaly or JVD noted.  No lower extremity edema. 2/4 pulses in all 4 extremities. Respiratory: Clear to auscultation with no wheezes or rales. Good inspiratory  effort. Abdomen: Soft, nontender nondistended with normal bowel sounds x4 quadrants. Muskuloskeletal: No cyanosis, clubbing or edema noted bilaterally Neuro: CN II-XII intact, sensation, reflexes intact Skin: No ulcerative lesions noted or rashes Psychiatry: Mood is appropriate for condition and setting          Labs on Admission:  Basic Metabolic Panel: Recent Labs  Lab 06/27/23 1628 06/28/23  1843  NA 137 133*  K 3.8 4.0  CL 98 96*  CO2 28 25  GLUCOSE 101* 101*  BUN 19 17  CREATININE 0.93 0.86  CALCIUM  9.3 9.1  MG 1.9 1.9   Liver Function Tests: Recent Labs  Lab 06/27/23 1628 06/28/23 1843  AST 18 24  ALT 11 13  ALKPHOS 75 74  BILITOT 1.1 1.6*  PROT 6.9 6.8  ALBUMIN 3.8 3.6   No results for input(s): "LIPASE", "AMYLASE" in the last 168 hours. Recent Labs  Lab 06/27/23 1626  AMMONIA 18   CBC: Recent Labs  Lab 06/27/23 1628 06/28/23 1843  WBC 7.6 10.1  NEUTROABS 6.6 9.0*  HGB 13.6 14.4  HCT 41.3 42.5  MCV 94.9 93.4  PLT 245 244   Cardiac Enzymes: No results for input(s): "CKTOTAL", "CKMB", "CKMBINDEX", "TROPONINI" in the last 168 hours.  BNP (last 3 results) Recent Labs    05/08/23 1523  BNP 89.0    ProBNP (last 3 results) No results for input(s): "PROBNP" in the last 8760 hours.  CBG: Recent Labs  Lab 06/27/23 1610  GLUCAP 112*    Radiological Exams on Admission: CT ABDOMEN PELVIS W CONTRAST Result Date: 06/28/2023 CLINICAL DATA:  Acute nonlocalized abdominal pain EXAM: CT ABDOMEN AND PELVIS WITH CONTRAST TECHNIQUE: Multidetector CT imaging of the abdomen and pelvis was performed using the standard protocol following bolus administration of intravenous contrast. RADIATION DOSE REDUCTION: This exam was performed according to the departmental dose-optimization program which includes automated exposure control, adjustment of the mA and/or kV according to patient size and/or use of iterative reconstruction technique. CONTRAST:  OMNIPAQUE   IOHEXOL  300 MG/ML  SOLN COMPARISON:  04/20/2020 FINDINGS: Lower chest: No acute abnormality. Extensive coronary artery calcification. Hepatobiliary: Advanced cirrhosis with a relatively shrunken appearance of the no enhancing intrahepatic mass identified. However, there is asymmetric moderate intrahepatic biliary ductal dilation involving the left biliary tree to the level of the confluence of the hepatic ducts with relative decompression of the right and common duct. This appears progressive since prior examination. An extrinsic obstructing lesion is not clearly identified, however, this is not optimally assessed on this examination. Additionally, a biliary stricture or intraluminal mass could result in a similar appearance. The gallbladder is unremarkable. Pancreas: 12 mm cystic lesion within the uncinate process of the pancreas has enlarged in the interval possibly representing a a dilated pancreatic side branch, intrapancreatic pseudocyst, or a cystic pancreatic neoplasm. No superimposed peripancreatic inflammatory changes or fluid collections identified. Spleen: Normal in size without focal abnormality. Adrenals/Urinary Tract: Adrenal glands are unremarkable. The kidneys are normal in size and position. Mild bilateral nonspecific perinephric edema. The kidneys are otherwise unremarkable. The bladder is distended, suggesting bladder outlet obstruction, and there is mild perivesicular inflammatory stranding which may relate to superimposed infectious or inflammatory cystitis. Stomach/Bowel: Stomach is within normal limits. Appendix appears normal. No evidence of bowel wall thickening, distention, or inflammatory changes. Vascular/Lymphatic: Aortic atherosclerosis. No enlarged abdominal or pelvic lymph nodes. Reproductive: Moderate prostatic hypertrophy Other: No abdominal wall hernia or abnormality. No abdominopelvic ascites. Musculoskeletal: Right hip ORIF and left total hip arthroplasty has been performed. The  osseous structures are diffusely osteopenic. Healed right rib fractures are noted. Remote L1 compression deformity with 50% loss of height anteriorly. No acute bone abnormality. IMPRESSION: 1. Advanced hepatic cirrhosis. 2. Progressive asymmetric moderate intrahepatic biliary ductal dilation involving the left biliary tree to the level of the confluence of the hepatic ducts with relative decompression of the right and common  duct. An extrinsic obstructing lesion is not clearly identified, however, this is not optimally assessed on this examination. Additionally, a biliary stricture or intraluminal mass such as cholangiocarcinoma could result in a similar appearance. Correlation with liver function tests is recommended. Additionally, dedicated MRI/MRCP examination is recommended. 3. 12 mm cystic lesion within the uncinate process of the pancreas possibly representing a dilated pancreatic side branch, intrapancreatic pseudocyst, or a cystic pancreatic neoplasm. This could be further assessed with MRI examination once the patient's acute issues have resolved. 4. Bladder distention in keeping with bladder outlet obstruction. Mild perivesicular inflammatory stranding which may relate to superimposed infectious or inflammatory cystitis. Catheter drainage and correlation with urinalysis is recommended. 5. Moderate prostatic hypertrophy. 6. Extensive coronary artery calcification. 7. Remote L1 compression deformity. Aortic Atherosclerosis (ICD10-I70.0). Electronically Signed   By: Worthy Heads M.D.   On: 06/28/2023 19:44   DG Chest Portable 1 View Result Date: 06/28/2023 EXAM: 1 VIEW XRAY OF THE CHEST 06/28/2023 06:50:58 PM COMPARISON: 06/27/2023 CLINICAL HISTORY: FTT. from [facility]. Staff reports patient is not eating. VS with EMS 135/70, 80 HR, 15 RR, 2L [place] (chronic), 131 CBG FINDINGS: LUNGS AND PLEURA: No consolidation. No pulmonary edema. No pleural effusion. No pneumothorax. HEART AND MEDIASTINUM: No acute  abnormality of the cardiac and mediastinal silhouettes. BONES AND SOFT TISSUES: No acute osseous abnormality. LINES AND TUBES: Left chest port terminating at the cavoatrial junction. VASCULATURE: Thoracic aortic atherosclerosis. IMPRESSION: 1. No acute findings. Electronically signed by: Zadie Herter MD 06/28/2023 07:19 PM EDT RP Workstation: [id number]   MR BRAIN WO CONTRAST Result Date: 06/27/2023 CLINICAL DATA:  Initial evaluation for acute neuro deficit, stroke suspected. EXAM: MRI HEAD WITHOUT CONTRAST TECHNIQUE: Multiplanar, multiecho pulse sequences of the brain and surrounding structures were obtained without intravenous contrast. COMPARISON:  Comparison made with CT from earlier the same day. FINDINGS: Brain: Examination markedly limited due to extensive susceptibility artifact emanating from the region of the right ear. Additionally, examination is markedly degraded by motion. Diffuse prominence of the CSF containing spaces compatible generalized cerebral atrophy. Patchy T2/FLAIR hyperintensity involving the periventricular deep white matter both cerebral hemispheres, most characteristic of chronic micro vessel ischemic disease, mild for age. No visible foci of restricted diffusion to suggest acute or subacute ischemia. No visible areas of chronic cortical infarction. No visible acute or chronic intracranial blood products. No visible mass lesion, mass effect or midline shift. Ventricular prominence related global parenchymal volume loss of hydrocephalus. No extra-axial fluid collection. Pituitary gland and suprasellar region within normal limits. Vascular: Major intracranial vascular flow voids are grossly maintained at the skull base. Skull and upper cervical spine: Cranial junctional limits. Bone marrow signal intensity normal. No scalp soft tissue abnormality. Sinuses/Orbits: Prior bilateral ocular lens replacement. Paranasal sinuses are largely clear. No visible mastoid effusion. Effusion,  although the right mastoid air cells are largely obscured. Other: None. IMPRESSION: 1. Technically limited exam due to extensive susceptibility artifact emanating from the region of the right ear. Additionally, examination is markedly degraded by motion. 2. No acute intracranial abnormality. 3. 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 Electronically Signed   By: Virgia Griffins M.D.   On: 06/27/2023 20:49   CT HEAD WO CONTRAST Result Date: 06/27/2023 CLINICAL DATA:  Mental status change of unknown cause. Visual disturbance. EXAM: CT HEAD WITHOUT CONTRAST TECHNIQUE: Contiguous axial images were obtained from the base of the skull through the vertex without intravenous contrast. RADIATION DOSE REDUCTION: This exam was performed according to the departmental  dose-optimization program which includes automated exposure control, adjustment of the mA and/or kV according to patient size and/or use of iterative reconstruction technique. COMPARISON:  Head CT 1 week ago. FINDINGS: Brain: Motion degraded study. No acute finding. Age related volume loss with some temporal lobe predominance. Chronic small-vessel ischemic changes of the white matter. No acute stroke, mass, hemorrhage, hydrocephalus or extra-axial collection. Vascular: No acute vascular finding. Skull: Negative Sinuses/Orbits: Clear/normal Other: Small chronic mastoid effusion on the right. IMPRESSION: Motion degraded study. No acute finding. Age related volume loss with some temporal lobe predominance. Chronic small-vessel ischemic changes of the white matter. Electronically Signed   By: Bettylou Brunner M.D.   On: 06/27/2023 16:09   DG Chest Port 1 View Result Date: 06/27/2023 CLINICAL DATA:  Weakness. EXAM: PORTABLE CHEST 1 VIEW COMPARISON:  06/20/2023. FINDINGS: Patient is rotated to the left. Stable cardiomediastinal silhouette. Aortic atherosclerosis. Stable left subclavian Port-A-Cath. No focal consolidation,  pleural effusion, or pneumothorax. No acute osseous abnormality. IMPRESSION: No acute cardiopulmonary findings. Electronically Signed   By: Mannie Seek M.D.   On: 06/27/2023 15:56    EKG: I independently viewed the EKG done and my findings are as followed: Normal sinus rhythm at a rate of 83 bpm   Assessment/Plan Present on Admission:  Altered mental status  COPD (chronic obstructive pulmonary disease)  Dementia without behavioral disturbance (HCC)  Acquired hypothyroidism  Mixed hyperlipidemia  Principal Problem:   Altered mental status Active Problems:   COPD (chronic obstructive pulmonary disease)   Acquired hypothyroidism   Mixed hyperlipidemia   Dementia without behavioral disturbance (HCC)   Generalized weakness   Failure to thrive in adult   Pancreatic cyst   Urinary retention  Altered mental status possibly due to multifactorial No known cause at this time Continue fall precaution, delirium precautions Monitor for any acute infectious process  Generalized weakness Failure to thrive in adult Continue fall precaution Protein supplement will be provided Continue PT/OT eval and treat Dietitian will be consulted and we will await further recommendations  Intrahepatic biliary ductal dilation MRCP was recommended to rule out a biliary stricture or intraluminal mass such as cholangiocarcinoma Consider gastroenterology consult based on findings  Pancreatic cyst CT abdomen and pelvis showed 12 mm cystic lesion within the uncinate process of the pancreas possibly representing a dilated pancreatic side branch, intrapancreatic pseudocyst, or a cystic pancreatic neoplasm MRI examination once the patient's acute issues have resolved recommended by radiologist  Urinary retention CT abdomen and pelvis showed bladder distention in keeping with bladder outlet obstruction Urinalysis was normal Foley catheter placed in the ED  COPD Continue albuterol  as  needed  Dementia Continue Aricept , [place]   Acquired hypothyroidism Continue Synthroid   Mixed hyperlipidemia Continue Crestor   DVT prophylaxis: Lovenox   Code Status: DNR  Family Communication: None at bedside  Consults: Dietitian  Severity of Illness: The appropriate patient status for this patient is INPATIENT. Inpatient status is judged to be reasonable and necessary in order to provide the required intensity of service to ensure the patient's safety. The patient's presenting symptoms, physical exam findings, and initial radiographic and laboratory data in the context of their chronic comorbidities is felt to place them at high risk for further clinical deterioration. Furthermore, it is not anticipated that the patient will be medically stable for discharge from the hospital within 2 midnights of admission.   * I certify that at the point of admission it is my clinical judgment that the patient will require inpatient hospital care spanning beyond 2  midnights from the point of admission due to high intensity of service, high risk for further deterioration and high frequency of surveillance required.*  Author: [place] Giammona, DO 06/29/2023 1:06 AM  For on call review [email or web address].

## 2023-06-28 NOTE — ED Notes (Signed)
 See triage notes. In with EDP to assess. Pt had tenderness to abd with palpation. No swelling noted. Eyes closed but responds with voice. Only oriented to name. Pt is having some hallucinations. Pt states "I want some iced tea", moved his right hand up to his face like he was drinking. Pt having some intermittent involuntary movements as well/jerking. Non diaphoretic.

## 2023-06-28 NOTE — ED Provider Notes (Signed)
 [place] EMERGENCY DEPARTMENT AT [facility] Provider Note   CSN: [id number] Arrival date & time: 06/28/23  1748     History {Add pertinent medical, surgical, social history, OB history to HPI:1} Chief Complaint  Patient presents with   Failure To Thrive    Ian Aguilar is a 83 y.o. male with PMH as listed below who presents From [facility] facility stating that he is not eating anything.  Patient states when asked, "I guess I fell this morning."  He denies any pain anywhere.  Unsure if he has had a fever but does endorse cough.  Denies chest pain or shortness of breath. Sleepy on exam but arouses to voice.   Son later at bedside states that patient was lucid on Tuesday but now is significantly altered. Had CTH/MRI brain yesterday which didn't show abnormalities. Son reports that he is being treated for a UTI.    Past Medical History:  Diagnosis Date   Anxiety    Arthritis    Cancer (HCC)    Cirrhosis (HCC)    confirmed by MRI on 07/04/11.     COPD (chronic obstructive pulmonary disease) (HCC)    Depression with anxiety    ETOH abuse    Hyperlipidemia    Hypertension    diet control   Nodule of upper lobe of right lung    with mediastinal adenopathy   Wears dentures    Wears glasses        Home Medications Prior to Admission medications   Medication Sig Start Date End Date Taking? Authorizing Provider  acetaminophen  (TYLENOL ) 325 MG tablet Take by mouth. 12/28/22   [provider]  albuterol  (VENTOLIN  HFA) 108 (90 Base) MCG/ACT inhaler Inhale 1 puff into the lungs 4 (four) times daily as needed. 05/09/23   Rayfield Cairo, MD  Aloe Vera GEL Apply topically.    [provider]  amLODipine  (NORVASC ) 5 MG tablet Take 1 tablet (5 mg total) by mouth daily. 06/27/23 07/27/23  Iva Mariner, MD  benzonatate  (TESSALON ) 200 MG capsule Take 200 mg by mouth 2 (two) times daily as needed for cough. 08/17/22   [provider]  cholecalciferol   (VITAMIN D ) 1000 units tablet Take 1,000 Units by mouth daily.    [provider]  ciprofloxacin (CIPRO) 500 MG tablet Take 500 mg by mouth 2 (two) times daily. 06/19/23   [provider]  cycloSPORINE  (RESTASIS ) 0.05 % ophthalmic emulsion Place 1 drop into both eyes 2 (two) times daily. 08/18/22   [provider]  desonide (DESOWEN) 0.05 % cream Apply topically. 01/16/23   [provider]  docusate sodium  (COLACE) 100 MG capsule Take 100 mg by mouth 2 (two) times daily.    [provider]  donepezil  (ARICEPT ) 10 MG tablet TAKE 1 TABLET BY MOUTH AT BEDTIME. 02/15/23   Alane Allen, Sara E, PA-C  FEROSUL 325 (65 Fe) MG tablet Take 325 mg by mouth daily with breakfast. 06/05/23   [provider]  GOODSENSE ARTHRITIS PAIN 1 % GEL Apply 2 g topically 4 (four) times daily. 05/07/23   [provider]  GOODSENSE ARTIFICIAL TEARS 0.5-0.6 % SOLN Apply to eye. 05/24/20   [provider]  ipratropium-albuterol  (DUONEB) 0.5-2.5 (3) MG/3ML SOLN Take 3 mLs by nebulization every 4 (four) hours as needed (wheezing, shortness of breath). 05/09/23   Bluestein, Clanford L, MD  levothyroxine  (SYNTHROID ) 150 MCG tablet Take 150 mcg by mouth daily before breakfast.    [provider]  loperamide (IMODIUM) 2 MG capsule Take by mouth as needed for diarrhea or loose stools.    [provider]  melatonin 5 MG TABS Take 5 mg by mouth at bedtime.    [provider]  memantine  (NAMENDA ) 5 MG tablet Take 1 tablet (5 mg at night) for 2 weeks, then increase to 1 tablet (5 mg) twice a day 02/15/23   Wertman, Sara E, PA-C  MUCUS RELIEF 600 MG 12 hr tablet Take 600 mg by mouth 2 (two) times daily as needed for cough or to loosen phlegm. 05/25/23   [provider]  OXYGEN  Inhale 2 L into the lungs continuous. 04/24/20   [provider]  pantoprazole  (PROTONIX ) 40 MG tablet Take 1 tablet (40 mg total) by mouth daily for 14 days. 05/10/23  05/24/23  Thivierge, Clanford L, MD  PARoxetine (PAXIL) 20 MG tablet Take 20 mg by mouth daily. 06/07/23   [provider]  predniSONE  (DELTASONE ) 10 MG tablet Take 10 mg by mouth with breakfast, with lunch, and with evening meal. 05/11/23   [provider]  Psyllium (METAMUCIL PO) Take by mouth. 1 capsule twice daily    [provider]  rosuvastatin  (CRESTOR ) 10 MG tablet Take 1 tablet (10 mg total) by mouth daily. 05/21/20   Marilyne Shu, NP  TRELEGY ELLIPTA 200-62.5-25 MCG/ACT AEPB Inhale 1 puff into the lungs daily. 09/15/22   [provider]  vitamin B-12 (CYANOCOBALAMIN ) 1000 MCG tablet Take 1 tablet (1,000 mcg total) by mouth daily. 02/10/21   Katragadda, Sreedhar, MD      Allergies    Patient has no known allergies.    Review of Systems   Review of Systems A 10 point review of systems was performed and is negative unless otherwise reported in HPI.  Physical Exam Updated Vital Signs BP 128/86   Pulse 92   Temp 97.6 F (36.4 C) (Oral)   Resp 18   SpO2 95%  Physical Exam General: Normal appearing elderly male, lying in bed.  HEENT: PERRLA, Sclera anicteric, MMM, trachea midline.  Cardiology: RRR, no murmurs/rubs/gallops. BL radial and DP pulses equal bilaterally.  Resp: Normal respiratory rate and effort. CTAB, no wheezes, rhonchi, crackles. On 2L [place] (baseline). Abd: Soft. Mildly distended with diffuse TTP. No rebound tenderness or guarding.  GU: Deferred. MSK: No peripheral edema or signs of trauma. Extremities without deformity or TTP. No cyanosis or clubbing. Skin: warm, dry.  Neuro: Sleepy but arouses to voice, oriented x1, CNs II-XII grossly intact. MAEs. Sensation grossly intact.  Psych:   ED Results / Procedures / Treatments   Labs (all labs ordered are listed, but only abnormal results are displayed) Labs Reviewed  CBC WITH DIFFERENTIAL/PLATELET  COMPREHENSIVE METABOLIC PANEL WITH GFR  MAGNESIUM   URINALYSIS, ROUTINE W REFLEX  MICROSCOPIC  CBG MONITORING, ED  TROPONIN I (HIGH SENSITIVITY)    EKG None  Radiology MR BRAIN WO CONTRAST Result Date: 06/27/2023 CLINICAL DATA:  Initial evaluation for acute neuro deficit, stroke suspected. EXAM: MRI HEAD WITHOUT CONTRAST TECHNIQUE: Multiplanar, multiecho pulse sequences of the brain and surrounding structures were obtained without intravenous contrast. COMPARISON:  Comparison made with CT from earlier the same day. FINDINGS: Brain: Examination markedly limited due to extensive susceptibility artifact emanating from the region of the right ear. Additionally, examination is markedly degraded by motion. Diffuse prominence of the CSF containing spaces compatible generalized cerebral atrophy. Patchy T2/FLAIR hyperintensity involving the periventricular deep white matter both cerebral hemispheres, most characteristic of chronic micro vessel  ischemic disease, mild for age. No visible foci of restricted diffusion to suggest acute or subacute ischemia. No visible areas of chronic cortical infarction. No visible acute or chronic intracranial blood products. No visible mass lesion, mass effect or midline shift. Ventricular prominence related global parenchymal volume loss of hydrocephalus. No extra-axial fluid collection. Pituitary gland and suprasellar region within normal limits. Vascular: Major intracranial vascular flow voids are grossly maintained at the skull base. Skull and upper cervical spine: Cranial junctional limits. Bone marrow signal intensity normal. No scalp soft tissue abnormality. Sinuses/Orbits: Prior bilateral ocular lens replacement. Paranasal sinuses are largely clear. No visible mastoid effusion. Effusion, although the right mastoid air cells are largely obscured. Other: None. IMPRESSION: 1. Technically limited exam due to extensive susceptibility artifact emanating from the region of the right ear. Additionally, examination is markedly degraded by motion. 2. No acute  intracranial abnormality. 3. 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 Electronically Signed   By: Virgia Griffins M.D.   On: 06/27/2023 20:49   CT HEAD WO CONTRAST Result Date: 06/27/2023 CLINICAL DATA:  Mental status change of unknown cause. Visual disturbance. EXAM: CT HEAD WITHOUT CONTRAST TECHNIQUE: Contiguous axial images were obtained from the base of the skull through the vertex without intravenous contrast. RADIATION DOSE REDUCTION: This exam was performed according to the departmental dose-optimization program which includes automated exposure control, adjustment of the mA and/or kV according to patient size and/or use of iterative reconstruction technique. COMPARISON:  Head CT 1 week ago. FINDINGS: Brain: Motion degraded study. No acute finding. Age related volume loss with some temporal lobe predominance. Chronic small-vessel ischemic changes of the white matter. No acute stroke, mass, hemorrhage, hydrocephalus or extra-axial collection. Vascular: No acute vascular finding. Skull: Negative Sinuses/Orbits: Clear/normal Other: Small chronic mastoid effusion on the right. IMPRESSION: Motion degraded study. No acute finding. Age related volume loss with some temporal lobe predominance. Chronic small-vessel ischemic changes of the white matter. Electronically Signed   By: Bettylou Brunner M.D.   On: 06/27/2023 16:09   DG Chest Port 1 View Result Date: 06/27/2023 CLINICAL DATA:  Weakness. EXAM: PORTABLE CHEST 1 VIEW COMPARISON:  06/20/2023. FINDINGS: Patient is rotated to the left. Stable cardiomediastinal silhouette. Aortic atherosclerosis. Stable left subclavian Port-A-Cath. No focal consolidation, pleural effusion, or pneumothorax. No acute osseous abnormality. IMPRESSION: No acute cardiopulmonary findings. Electronically Signed   By: Mannie Seek M.D.   On: 06/27/2023 15:56    Procedures Procedures  {Document cardiac monitor, telemetry assessment  procedure when appropriate:1}  Medications Ordered in ED Medications - No data to display  ED Course/ Medical Decision Making/ A&P                          Medical Decision Making Amount and/or Complexity of Data Reviewed Labs: ordered. Decision-making details documented in ED Course. Radiology: ordered. Decision-making details documented in ED Course.  Risk Prescription drug management. Decision regarding hospitalization.    This patient presents to the ED for concern of FTT, decreased PO intake, abd TTP on exam, this involves an extensive number of treatment options, and is a complaint that carries with it a high risk of complications and morbidity.  I considered the following differential and admission for this acute, potentially life threatening condition.   MDM:    ***  Clinical Course as of 06/28/23 2210  Thu Jun 28, 2023  2130 CT ABDOMEN PELVIS W CONTRAST 1. Advanced hepatic cirrhosis. 2. Progressive asymmetric moderate  intrahepatic biliary ductal dilation involving the left biliary tree to the level of the confluence of the hepatic ducts with relative decompression of the right and common duct. An extrinsic obstructing lesion is not clearly identified, however, this is not optimally assessed on this examination. Additionally, a biliary stricture or intraluminal mass such as cholangiocarcinoma could result in a similar appearance. Correlation with liver function tests is recommended. Additionally, dedicated MRI/MRCP examination is recommended. 3. 12 mm cystic lesion within the uncinate process of the pancreas possibly representing a dilated pancreatic side branch, intrapancreatic pseudocyst, or a cystic pancreatic neoplasm. This could be further assessed with MRI examination once the patient's acute issues have resolved. 4. Bladder distention in keeping with bladder outlet obstruction. Mild perivesicular inflammatory stranding which may relate to superimposed infectious  or inflammatory cystitis. Catheter drainage and correlation with urinalysis is recommended. 5. Moderate prostatic hypertrophy. 6. Extensive coronary artery calcification. 7. Remote L1 compression deformity. [HN]  2143 Specific Gravity, Urine(!): >1.046 +dehydrated, giving fluids [HN]  2144 Bladder outlet obstruction noted on CT. Will have RN bladder scan post void residual. [HN]  2205 D/w bedside nurse. Patient has been trying to urinate all day and is just "dribbling." Do not believe it is due to an inability to follow instructions, because patient stated he needed to urinate/BM and RNs were able to get him onto a commode as well but couldn't urinate or have a bowel movement. Son, who is now at bedside, reports that he was being treated for a urinary tract infection a couple of days ago. Will have RN place foley catheter for acute urinary retention.  [HN]  2209 Total Bilirubin(!): 1.6 Very mild elevated bilirubin, otherwise no elevated LFTs [HN]    Clinical Course User Index [HN] Merdis Stalling, MD    Labs: I Ordered, and personally interpreted labs.  The pertinent results include:  those listed above  Imaging Studies ordered: I ordered imaging studies including CXR, CT abd pelvis I independently visualized and interpreted imaging. I agree with the radiologist interpretation  Additional history obtained from chart review.   Cardiac Monitoring: The patient was maintained on a cardiac monitor.  I personally viewed and interpreted the cardiac monitored which showed an underlying rhythm of: NSR  Reevaluation: After the interventions noted above, I reevaluated the patient and found that they have :{resolved/improved/worsened:23923::"improved"}  Social Determinants of Health: Lives at [facility]  Disposition:  ***  Co morbidities that complicate the patient evaluation  Past Medical History:  Diagnosis Date   Anxiety    Arthritis    Cancer (HCC)    Cirrhosis (HCC)    confirmed  by MRI on 07/04/11.     COPD (chronic obstructive pulmonary disease) (HCC)    Depression with anxiety    ETOH abuse    Hyperlipidemia    Hypertension    diet control   Nodule of upper lobe of right lung    with mediastinal adenopathy   Wears dentures    Wears glasses      Medicines No orders of the defined types were placed in this encounter.   I have reviewed the patients home medicines and have made adjustments as needed  Problem List / ED Course: Problem List Items Addressed This Visit   None        {Document critical care time when appropriate:1} {Document review of labs and clinical decision tools ie heart score, Chads2Vasc2 etc:1}  {Document your independent review of radiology images, and any outside records:1} {Document your discussion  with family members, caretakers, and with consultants:1} {Document social determinants of health affecting pt's care:1} {Document your decision making why or why not admission, treatments were needed:1}  This note was created using dictation software, which may contain spelling or grammatical errors.

## 2023-06-28 NOTE — ED Notes (Addendum)
 Pts daughter called and spoke with this RN and stated "I want him admitted, this will be his 3rd or 4th time in a week that [facility] has sent him back to [facility] ED and he keeps on getting discharged and sent back to [facility] again. Now [facility] states he wont eat or drink anything. If he is going to be discharged I would yall to call either his son, Estephan Lapietra, or myself Alfonza Iles) instead of calling [facility] first" both contacts listed in chart

## 2023-06-29 ENCOUNTER — Inpatient Hospital Stay (HOSPITAL_COMMUNITY)

## 2023-06-29 DIAGNOSIS — K862 Cyst of pancreas: Secondary | ICD-10-CM | POA: Diagnosis present

## 2023-06-29 DIAGNOSIS — N401 Enlarged prostate with lower urinary tract symptoms: Secondary | ICD-10-CM | POA: Diagnosis not present

## 2023-06-29 DIAGNOSIS — R310 Gross hematuria: Secondary | ICD-10-CM

## 2023-06-29 DIAGNOSIS — R627 Adult failure to thrive: Secondary | ICD-10-CM | POA: Diagnosis not present

## 2023-06-29 DIAGNOSIS — R338 Other retention of urine: Secondary | ICD-10-CM | POA: Diagnosis not present

## 2023-06-29 DIAGNOSIS — R404 Transient alteration of awareness: Secondary | ICD-10-CM | POA: Diagnosis not present

## 2023-06-29 DIAGNOSIS — R531 Weakness: Secondary | ICD-10-CM

## 2023-06-29 DIAGNOSIS — R339 Retention of urine, unspecified: Secondary | ICD-10-CM | POA: Diagnosis present

## 2023-06-29 DIAGNOSIS — L8990 Pressure ulcer of unspecified site, unspecified stage: Secondary | ICD-10-CM | POA: Insufficient documentation

## 2023-06-29 LAB — COMPREHENSIVE METABOLIC PANEL WITH GFR
ALT: 12 U/L (ref 0–44)
AST: 19 U/L (ref 15–41)
Albumin: 3.1 g/dL — ABNORMAL LOW (ref 3.5–5.0)
Alkaline Phosphatase: 60 U/L (ref 38–126)
Anion gap: 9 (ref 5–15)
BUN: 15 mg/dL (ref 8–23)
CO2: 29 mmol/L (ref 22–32)
Calcium: 8.7 mg/dL — ABNORMAL LOW (ref 8.9–10.3)
Chloride: 97 mmol/L — ABNORMAL LOW (ref 98–111)
Creatinine, Ser: 0.82 mg/dL (ref 0.61–1.24)
GFR, Estimated: >60 mL/min (ref >60)
Glucose, Bld: 87 mg/dL (ref 70–99)
Potassium: 3.6 mmol/L (ref 3.5–5.1)
Sodium: 135 mmol/L (ref 135–145)
Total Bilirubin: 1.2 mg/dL (ref 0.0–1.2)
Total Protein: 5.7 g/dL — ABNORMAL LOW (ref 6.5–8.1)

## 2023-06-29 LAB — CBC
HCT: 36.7 % — ABNORMAL LOW (ref 39.0–52.0)
Hemoglobin: 12.2 g/dL — ABNORMAL LOW (ref 13.0–17.0)
MCH: 30.8 pg (ref 26.0–34.0)
MCHC: 33.2 g/dL (ref 30.0–36.0)
MCV: 92.7 fL (ref 80.0–100.0)
Platelets: 229 10*3/uL (ref 150–400)
RBC: 3.96 MIL/uL — ABNORMAL LOW (ref 4.22–5.81)
RDW: 14 % (ref 11.5–15.5)
WBC: 7.7 10*3/uL (ref 4.0–10.5)
nRBC: 0 % (ref 0.0–0.2)

## 2023-06-29 LAB — PHOSPHORUS: Phosphorus: 3.2 mg/dL (ref 2.5–4.6)

## 2023-06-29 LAB — MAGNESIUM: Magnesium: 1.6 mg/dL — ABNORMAL LOW (ref 1.7–2.4)

## 2023-06-29 MED ORDER — SODIUM CHLORIDE 0.9 % IV SOLN: INTRAVENOUS | Status: DC

## 2023-06-29 MED ORDER — MEMANTINE HCL 10 MG PO TABS
5.0000 mg | ORAL_TABLET | Freq: Two times a day (BID) | ORAL | Status: DC
  Administered 2023-06-29 – 2023-07-04 (×11): 5 mg via ORAL
  Filled 2023-06-29 (×11): qty 1

## 2023-06-29 MED ORDER — LEVOTHYROXINE SODIUM 75 MCG PO TABS: 75.0000 ug | ORAL_TABLET | Freq: Every day | ORAL | Status: DC

## 2023-06-29 MED ORDER — CHLORHEXIDINE GLUCONATE CLOTH 2 % EX PADS
6.0000 | MEDICATED_PAD | Freq: Every day | CUTANEOUS | Status: DC
  Administered 2023-06-29 – 2023-07-04 (×5): 6 via TOPICAL

## 2023-06-29 MED ORDER — ENOXAPARIN SODIUM 40 MG/0.4ML IJ SOSY
40.0000 mg | PREFILLED_SYRINGE | INTRAMUSCULAR | Status: DC
  Administered 2023-06-29 – 2023-07-04 (×6): 40 mg via SUBCUTANEOUS
  Filled 2023-06-29 (×6): qty 0.4

## 2023-06-29 MED ORDER — ALBUTEROL SULFATE (2.5 MG/3ML) 0.083% IN NEBU: 2.5000 mg | INHALATION_SOLUTION | Freq: Four times a day (QID) | RESPIRATORY_TRACT | Status: DC | PRN

## 2023-06-29 MED ORDER — LEVOTHYROXINE SODIUM 100 MCG PO TABS
100.0000 ug | ORAL_TABLET | Freq: Every day | ORAL | Status: DC
  Administered 2023-06-29: 100 ug via ORAL
  Filled 2023-06-29: qty 1

## 2023-06-29 MED ORDER — SODIUM CHLORIDE 0.9 % IV SOLN
2.0000 g | INTRAVENOUS | Status: DC
  Administered 2023-06-29: 2 g via INTRAVENOUS
  Filled 2023-06-29: qty 20

## 2023-06-29 MED ORDER — ROSUVASTATIN CALCIUM 10 MG PO TABS
10.0000 mg | ORAL_TABLET | Freq: Every day | ORAL | Status: DC
  Administered 2023-06-29 – 2023-07-03 (×5): 10 mg via ORAL
  Filled 2023-06-29 (×5): qty 1

## 2023-06-29 MED ORDER — TAMSULOSIN HCL 0.4 MG PO CAPS
0.4000 mg | ORAL_CAPSULE | Freq: Every day | ORAL | Status: DC
  Administered 2023-06-30 – 2023-07-03 (×4): 0.4 mg via ORAL
  Filled 2023-06-29 (×4): qty 1

## 2023-06-29 MED ORDER — MAGNESIUM SULFATE 4 GM/100ML IV SOLN
4.0000 g | Freq: Once | INTRAVENOUS | Status: AC
Start: 2023-06-29 — End: 2023-06-29
  Administered 2023-06-29: 4 g via INTRAVENOUS
  Filled 2023-06-29: qty 100

## 2023-06-29 MED ORDER — LORAZEPAM 2 MG/ML IJ SOLN
1.0000 mg | Freq: Two times a day (BID) | INTRAMUSCULAR | Status: DC | PRN
  Administered 2023-06-29: 1 mg via INTRAVENOUS
  Filled 2023-06-29: qty 1

## 2023-06-29 MED ORDER — GADOBUTROL 1 MMOL/ML IV SOLN
7.0000 mL | Freq: Once | INTRAVENOUS | Status: AC | PRN
  Administered 2023-06-29: 7 mL via INTRAVENOUS

## 2023-06-29 MED ORDER — ONDANSETRON HCL 4 MG PO TABS: 4.0000 mg | ORAL_TABLET | Freq: Four times a day (QID) | ORAL | Status: DC | PRN

## 2023-06-29 MED ORDER — ONDANSETRON HCL 4 MG/2ML IJ SOLN: 4.0000 mg | Freq: Four times a day (QID) | INTRAMUSCULAR | Status: DC | PRN

## 2023-06-29 MED ORDER — DONEPEZIL HCL 5 MG PO TABS
10.0000 mg | ORAL_TABLET | Freq: Every day | ORAL | Status: DC
  Administered 2023-06-29 – 2023-07-03 (×5): 10 mg via ORAL
  Filled 2023-06-29 (×5): qty 2

## 2023-06-29 MED ORDER — LEVOTHYROXINE SODIUM 75 MCG PO TABS
75.0000 ug | ORAL_TABLET | Freq: Every day | ORAL | Status: DC
  Administered 2023-07-01 – 2023-07-04 (×4): 75 ug via ORAL
  Filled 2023-06-29 (×4): qty 1

## 2023-06-29 MED ORDER — ALBUTEROL SULFATE HFA 108 (90 BASE) MCG/ACT IN AERS: 1.0000 | INHALATION_SPRAY | Freq: Four times a day (QID) | RESPIRATORY_TRACT | Status: DC | PRN

## 2023-06-29 NOTE — Progress Notes (Signed)
 PROGRESS NOTE  Ian Aguilar, is a 83 y.o. male, DOB - Jul 10, 1940, ZOX:[id number]  Admit date - 06/28/2023   Admitting Physician Twilla Galea, DO  Outpatient Primary MD for the patient is Ian Amel, MD  LOS - 1  Chief Complaint  Patient presents with   Failure To Thrive      Brief Narrative:  83 y.o. male with medical history significant of COPD, osteoarthritis of hip admitted on 06/28/2023 from [facility] with acute metabolic encephalopathy and generalized weakness with failure to thrive/urinary retention and hematuria   -Assessment and Plan: 1)Acute Metabolic Encephalopathy--??  Etiology - Chest x-ray, CT abdomen and pelvis as well as MRI/MRCP abdomen without evidence of acute infection - UA is not suggestive of UTI - No leukocytosis, no fevers - Patient received IV Rocephin  in the ED - Okay to hold off on further antibiotics at this time  2)AbNormal Abd Imaging--- CT abdomen and pelvis and MRCP shows liver surface nodularity, compatible with cirrhosis. No discrete focal liver lesion seen. No ascites or Splenomegaly, as well as multiple peribiliary cysts, which are nonspecific but usually seen in the setting of severe chronic liver disease, portal hypertension, ADPKD, etc. 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 Claretta Croft  Status is: Inpatient   Disposition: The patient is from: ALF              Anticipated d/c is to: SNF              Anticipated d/c date is: 1 day              Patient currently is not medically stable to d/c. Barriers: Not Clinically Stable-   Code Status : -  Code Status: Do not attempt resuscitation (DNR) PRE-ARREST INTERVENTIONS DESIRED   Family Communication:   Discussed with with Daughter Mrs Ian Aguilar -Ian Aguilar DVT Prophylaxis  :   - SCDs   enoxaparin  (LOVENOX ) injection 40 mg Start: 06/29/23 1000 SCDs Start: 06/29/23 0043   Lab Results  Component Value Date   PLT 229 06/29/2023    Inpatient Medications  Scheduled Meds:  Chlorhexidine  Gluconate Cloth  6 each Topical Q0600   donepezil   10 mg Oral QHS   enoxaparin  (LOVENOX ) injection  40 mg Subcutaneous Q24H   levothyroxine   100 mcg Oral Daily   memantine   5 mg Oral BID   rosuvastatin   10 mg Oral Q2200   Continuous Infusions:  cefTRIAXone  (ROCEPHIN )  IV 2 g (06/29/23 1529)   PRN Meds:.albuterol , LORazepam , ondansetron  **OR** ondansetron  (ZOFRAN ) IV   Anti-infectives (From admission, onward)    Start     Dose/Rate Route Frequency Ordered Stop   06/29/23 1415  cefTRIAXone  (ROCEPHIN ) 2 g in sodium chloride  0.9 % 100 mL IVPB  2 g 200 mL/hr over 30 Minutes Intravenous Every 24 hours 06/29/23 1319           Subjective: [name] today has no fevers, no emesis,  No chest pain,   Intermittently confused -- Called and updated patient's daughter by phone   Objective: Vitals:   06/29/23 1100 06/29/23 1143 06/29/23 1200 06/29/23 1525  BP: (!) 143/77  (!) 127/46   Pulse: 86  70   Resp: 19  15   Temp:  97.6 F (36.4 C)  97.7 F (36.5 C)  TempSrc:   Axillary  Axillary  SpO2: 100%  97%   Weight:      Height:        Intake/Output Summary (Last 24 hours) at 06/29/2023 1634 Last data filed at 06/29/2023 0520 Gross per 24 hour  Intake 1800.44 ml  Output 1670 ml  Net 130.44 ml   Filed Weights   06/29/23 1006  Weight: 70.2 kg    Physical Exam  Gen:- Awake , pleasantly confused and restless at times HEENT:- [place].AT, No sclera icterus Neck-Supple Neck,No JVD,.  Lungs-  CTAB , fair symmetrical air movement CV- S1, S2 normal, regular , Lt sided [place] Cath in situ Abd-  +ve B.Sounds, Abd Soft, No tenderness,    Extremity/Skin:- No  edema, pedal pulses present  Psych-cognitive and memory deficits consistent with underlying dementia with episodes of agitation and restlessness from time to time  -neuro-generalized weakness ,,no new focal deficits, no tremors  Data Reviewed: I have personally reviewed following labs and imaging studies  CBC: Recent Labs  Lab 06/27/23 1628 06/28/23 1843 06/29/23 0348  WBC 7.6 10.1 7.7  NEUTROABS 6.6 9.0*  --   HGB 13.6 14.4 12.2*  HCT 41.3 42.5 36.7*  MCV 94.9 93.4 92.7  PLT 245 244 229   Basic Metabolic Panel: Recent Labs  Lab 06/27/23 1628 06/28/23 1843 06/29/23 0348  NA 137 133* 135  K 3.8 4.0 3.6  CL 98 96* 97*  CO2 28 25 29   GLUCOSE 101* 101* 87  BUN 19 17 15   CREATININE 0.93 0.86 0.82  CALCIUM  9.3 9.1 8.7*  MG 1.9 1.9 1.6*  PHOS  --   --  3.2   GFR: Estimated Creatinine Clearance: 69 mL/min (by C-G formula based on SCr of 0.82 mg/dL). Liver Function Tests: Recent Labs  Lab 06/27/23 1628 06/28/23 1843 06/29/23 0348  AST 18 24 19   ALT 11 13 12   ALKPHOS 75 74 60  BILITOT 1.1 1.6* 1.2  PROT 6.9 6.8 5.7*  ALBUMIN 3.8 3.6 3.1*    Radiology Studies: MR ABDOMEN MRCP W WO CONTAST Result Date: 06/29/2023 CLINICAL DATA:  Gallbladder/biliary cancer, monitor; Gallbladder/biliary cancer, monitor [phone] Abnormal CT of the abdomen 220388. EXAM: MRI ABDOMEN WITHOUT AND WITH CONTRAST  (INCLUDING MRCP) TECHNIQUE: Multiplanar multisequence MR imaging of the abdomen was performed both before and after the administration of intravenous contrast. Heavily T2-weighted images of the biliary and pancreatic ducts were obtained, and three-dimensional MRCP images were rendered by post processing. CONTRAST:  7mL GADAVIST  GADOBUTROL  1 MMOL/ML IV SOLN COMPARISON:  CT scan abdomen and pelvis from 06/28/2023. FINDINGS: Note examination is mild-to-moderately limited due to patient's motion during data acquisition. Lower chest: Unremarkable MR appearance to the lung bases. No pleural effusion. No pericardial effusion. Normal heart size. Hepatobiliary: Redemonstration of liver surface nodularity, compatible with cirrhosis. No discrete focal liver lesion seen. Redemonstration of multiple well-circumscribed cystic lesions mainly surrounding the left lobe bile ducts but small amount of cysts  are also seen surrounding the right lobe bile ducts as well as at the confluence. The MRCP images are motion degraded but these cystic lesions are likely separate to the adjacent bile ducts, which are not dilated. These are favored to represent peribiliary cysts which usually occurs in the setting of severe chronic liver disease, portal hypertension, ADPKD, etc. There is interval increase in the size and number of cysts when compared to the prior MRI from 04/21/2020. The extrahepatic bile ducts are not dilated. No choledocholithiasis. No obstructing mass seen. Unremarkable gallbladder. Pancreas: Small/atrophic pancreas. Slightly prominent main pancreatic duct. There are 2 cystic lesions in the pancreatic head/uncinate process (measuring 9 x 14 mm) and pancreatic body (measuring 9 x 9 mm), which previously measured approximately 8 x 11 mm and 9 x 10 mm, respectively. The lesions are nonenhancing. 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 Continued follow-up is recommended in 2 years to document stability. Spleen:  Within normal limits in size and appearance. No focal mass. Adrenals/Urinary Tract: Unremarkable adrenal glands. No hydroureteronephrosis. No suspicious renal mass. Stomach/Bowel: Visualized portions within the abdomen are unremarkable. No disproportionate dilation of bowel loops. Vascular/Lymphatic: No pathologically enlarged lymph nodes identified. No abdominal aortic aneurysm demonstrated. No ascites. Other:  None. Musculoskeletal: No suspicious bone lesions identified. IMPRESSION: 1. Motion degraded exam. 2. Redemonstration of liver surface nodularity, compatible with cirrhosis. No discrete focal liver lesion seen. No ascites or splenomegaly. 3. Redemonstration of multiple peribiliary cysts, which are nonspecific but usually seen in the setting of severe chronic liver disease, portal hypertension, ADPKD, etc. 4. 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 Continued follow-up is recommended. 5. Multiple other nonacute observations, as described above. Electronically Signed   By: Beula Brunswick M.D.   On: 06/29/2023 15:52   MR 3D Recon At Scanner Result Date: 06/29/2023 CLINICAL DATA:  Gallbladder/biliary cancer, monitor; Gallbladder/biliary cancer, monitor [phone] Abnormal CT of the abdomen 220388. EXAM: MRI ABDOMEN WITHOUT AND WITH CONTRAST (INCLUDING MRCP) TECHNIQUE: Multiplanar multisequence MR imaging of the abdomen was performed both before and after the administration of intravenous contrast. Heavily T2-weighted images of the biliary and pancreatic ducts were obtained, and three-dimensional MRCP images were rendered by post processing. CONTRAST:  7mL GADAVIST  GADOBUTROL  1 MMOL/ML IV SOLN COMPARISON:  CT scan abdomen and pelvis from 06/28/2023. FINDINGS: Note examination is  mild-to-moderately limited due to patient's motion during data acquisition. Lower chest: Unremarkable MR appearance to the lung bases. No pleural effusion. No pericardial effusion. Normal heart size. Hepatobiliary: Redemonstration of liver surface nodularity, compatible with cirrhosis. No discrete focal liver lesion seen. Redemonstration of multiple well-circumscribed cystic lesions mainly surrounding the left lobe bile ducts but small amount of cysts are also seen surrounding the right lobe bile ducts as well as at the confluence. The MRCP images are motion degraded but these cystic lesions are likely separate to the adjacent bile ducts, which are not dilated. These are favored to represent peribiliary cysts which usually occurs in the setting of severe chronic liver disease, portal hypertension, ADPKD, etc. There is interval increase in the size and number of cysts when compared to the prior MRI from 04/21/2020. The extrahepatic bile ducts are not dilated. No choledocholithiasis. No obstructing mass seen. Unremarkable gallbladder. Pancreas: Small/atrophic pancreas. Slightly prominent main pancreatic duct. There are 2 cystic lesions in the pancreatic head/uncinate  process (measuring 9 x 14 mm) and pancreatic body (measuring 9 x 9 mm), which previously measured approximately 8 x 11 mm and 9 x 10 mm, respectively. The lesions are nonenhancing. 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 Continued follow-up is recommended in 2 years to document stability. Spleen:  Within normal limits in size and appearance. No focal mass. Adrenals/Urinary Tract: Unremarkable adrenal glands. No hydroureteronephrosis. No suspicious renal mass. Stomach/Bowel: Visualized portions within the abdomen are unremarkable. No disproportionate dilation of bowel loops. Vascular/Lymphatic: No pathologically enlarged lymph  nodes identified. No abdominal aortic aneurysm demonstrated. No ascites. Other:  None. Musculoskeletal: No suspicious bone lesions identified. IMPRESSION: 1. Motion degraded exam. 2. Redemonstration of liver surface nodularity, compatible with cirrhosis. No discrete focal liver lesion seen. No ascites or splenomegaly. 3. Redemonstration of multiple peribiliary cysts, which are nonspecific but usually seen in the setting of severe chronic liver disease, portal hypertension, ADPKD, etc. 4. 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 Continued follow-up is recommended. 5. Multiple other nonacute observations, as described above. Electronically Signed   By: Beula Brunswick M.D.   On: 06/29/2023 15:52   CT ABDOMEN PELVIS W CONTRAST Result Date: 06/28/2023 CLINICAL DATA:  Acute nonlocalized abdominal pain EXAM: CT ABDOMEN AND PELVIS WITH CONTRAST TECHNIQUE: Multidetector CT imaging of the abdomen and pelvis was performed using the standard protocol following bolus administration of intravenous contrast. RADIATION DOSE REDUCTION: This exam was performed according to the departmental dose-optimization program which includes automated exposure control, adjustment of the mA and/or kV according to patient size and/or use of iterative reconstruction technique. CONTRAST:  OMNIPAQUE  IOHEXOL  300 MG/ML  SOLN COMPARISON:  04/20/2020 FINDINGS: Lower chest: No acute abnormality. Extensive coronary artery calcification. Hepatobiliary: Advanced cirrhosis with a relatively shrunken appearance of the no enhancing intrahepatic mass identified. However, there is asymmetric moderate intrahepatic biliary ductal dilation involving the left biliary tree to the level of the confluence of the hepatic ducts with relative decompression of the right and common duct. This appears progressive since prior examination. An extrinsic obstructing  lesion is not clearly identified, however, this is not optimally assessed on this examination. Additionally, a biliary stricture or intraluminal mass could result in a similar appearance. The gallbladder is unremarkable. Pancreas: 12 mm cystic lesion within the uncinate process of the pancreas has enlarged in the interval possibly representing a a dilated pancreatic side branch, intrapancreatic pseudocyst, or a cystic pancreatic neoplasm. No superimposed peripancreatic inflammatory changes or fluid collections identified. Spleen: Normal in size without focal abnormality. Adrenals/Urinary Tract: Adrenal glands are unremarkable. The kidneys are normal in size and position. Mild bilateral nonspecific perinephric edema. The kidneys are otherwise unremarkable. The bladder is distended, suggesting bladder outlet obstruction, and there is mild perivesicular inflammatory stranding which may relate to superimposed infectious or inflammatory cystitis. Stomach/Bowel: Stomach is within normal limits. Appendix appears normal. No evidence of bowel wall thickening, distention, or inflammatory changes. Vascular/Lymphatic: Aortic atherosclerosis. No enlarged abdominal or pelvic lymph nodes. Reproductive: Moderate prostatic hypertrophy Other: No abdominal wall hernia or abnormality. No abdominopelvic ascites. Musculoskeletal: Right hip ORIF and left total hip arthroplasty has been performed. The osseous structures are diffusely osteopenic. Healed right rib fractures are noted. Remote L1 compression deformity with 50% loss of height anteriorly. No acute bone abnormality. IMPRESSION: 1. Advanced hepatic cirrhosis. 2. Progressive asymmetric moderate intrahepatic  biliary ductal dilation involving the left biliary tree to the level of the confluence of the hepatic ducts with relative decompression of the right and common duct. An extrinsic obstructing lesion is not clearly identified, however, this is not optimally assessed on this  examination. Additionally, a biliary stricture or intraluminal mass such as cholangiocarcinoma could result in a similar appearance. Correlation with liver function tests is recommended. Additionally, dedicated MRI/MRCP examination is recommended. 3. 12 mm cystic lesion within the uncinate process of the pancreas possibly representing a dilated pancreatic side branch, intrapancreatic pseudocyst, or a cystic pancreatic neoplasm. This could be further assessed with MRI examination once the patient's acute issues have resolved. 4. Bladder distention in keeping with bladder outlet obstruction. Mild perivesicular inflammatory stranding which may relate to superimposed infectious or inflammatory cystitis. Catheter drainage and correlation with urinalysis is recommended. 5. Moderate prostatic hypertrophy. 6. Extensive coronary artery calcification. 7. Remote L1 compression deformity. Aortic Atherosclerosis (ICD10-I70.0). Electronically Signed   By: Worthy Heads M.D.   On: 06/28/2023 19:44   DG Chest Portable 1 View Result Date: 06/28/2023 EXAM: 1 VIEW XRAY OF THE CHEST 06/28/2023 06:50:58 PM COMPARISON: 06/27/2023 CLINICAL HISTORY: FTT. from [facility]. Staff reports patient is not eating. VS with EMS 135/70, 80 HR, 15 RR, 2L [place] (chronic), 131 CBG FINDINGS: LUNGS AND PLEURA: No consolidation. No pulmonary edema. No pleural effusion. No pneumothorax. HEART AND MEDIASTINUM: No acute abnormality of the cardiac and mediastinal silhouettes. BONES AND SOFT TISSUES: No acute osseous abnormality. LINES AND TUBES: Left chest port terminating at the cavoatrial junction. VASCULATURE: Thoracic aortic atherosclerosis. IMPRESSION: 1. No acute findings. Electronically signed by: Zadie Herter MD 06/28/2023 07:19 PM EDT RP Workstation: [id number]   MR BRAIN WO CONTRAST Result Date: 06/27/2023 CLINICAL DATA:  Initial evaluation for acute neuro deficit, stroke suspected. EXAM: MRI HEAD WITHOUT CONTRAST TECHNIQUE: Multiplanar,  multiecho pulse sequences of the brain and surrounding structures were obtained without intravenous contrast. COMPARISON:  Comparison made with CT from earlier the same day. FINDINGS: Brain: Examination markedly limited due to extensive susceptibility artifact emanating from the region of the right ear. Additionally, examination is markedly degraded by motion. Diffuse prominence of the CSF containing spaces compatible generalized cerebral atrophy. Patchy T2/FLAIR hyperintensity involving the periventricular deep white matter both cerebral hemispheres, most characteristic of chronic micro vessel ischemic disease, mild for age. No visible foci of restricted diffusion to suggest acute or subacute ischemia. No visible areas of chronic cortical infarction. No visible acute or chronic intracranial blood products. No visible mass lesion, mass effect or midline shift. Ventricular prominence related global parenchymal volume loss of hydrocephalus. No extra-axial fluid collection. Pituitary gland and suprasellar region within normal limits. Vascular: Major intracranial vascular flow voids are grossly maintained at the skull base. Skull and upper cervical spine: Cranial junctional limits. Bone marrow signal intensity normal. No scalp soft tissue abnormality. Sinuses/Orbits: Prior bilateral ocular lens replacement. Paranasal sinuses are largely clear. No visible mastoid effusion. Effusion, although the right mastoid air cells are largely obscured. Other: None. IMPRESSION: 1. Technically limited exam due to extensive susceptibility artifact emanating from the region of the right ear. Additionally, examination is markedly degraded by motion. 2. No acute intracranial abnormality. 3. 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 Electronically Signed   By: Virgia Griffins M.D.   On: 06/27/2023 20:49     Scheduled Meds:  Chlorhexidine  Gluconate Cloth  6 each Topical Q0600   donepezil   10 mg  Oral QHS   enoxaparin  (LOVENOX )  injection  40 mg Subcutaneous Q24H   levothyroxine   100 mcg Oral Daily   memantine   5 mg Oral BID   rosuvastatin   10 mg Oral Q2200   Continuous Infusions:  cefTRIAXone  (ROCEPHIN )  IV 2 g (06/29/23 1529)     LOS: 1 day    Colin Dawley M.D on 06/29/2023 at 4:34 PM  Go to [email or web address] - for contact info  [facility] - Office  [phone]  If 7PM-7AM, please contact night-coverage [email or web address] 06/29/2023, 4:34 PM

## 2023-06-29 NOTE — Plan of Care (Signed)
  Problem: Acute Rehab OT Goals (only OT should resolve) Goal: Pt. Will Perform Grooming Flowsheets (Taken 06/29/2023 1353) Pt Will Perform Grooming:  with modified independence  standing Goal: Pt. Will Perform Lower Body Bathing Flowsheets (Taken 06/29/2023 1354) Pt Will Perform Lower Body Bathing:  with modified independence  sitting/lateral leans Goal: Pt. Will Perform Lower Body Dressing Flowsheets (Taken 06/29/2023 1354) Pt Will Perform Lower Body Dressing:  with modified independence  sitting/lateral leans Goal: Pt. Will Transfer To Toilet Flowsheets (Taken 06/29/2023 1354) Pt Will Transfer to Toilet:  with modified independence  ambulating Goal: Pt. Will Perform Toileting-Clothing Manipulation Flowsheets (Taken 06/29/2023 1354) Pt Will Perform Toileting - Clothing Manipulation and hygiene:  with modified independence  sitting/lateral leans  Brantlee Penn OT, MOT

## 2023-06-29 NOTE — ED Notes (Addendum)
 Provider made aware of blood tinged urine in foley bag, orders given to flush foley to see if it clears. Flushed with about 800 ml of saline to until urine is a clear pale pink color. Returned about 800 ml. Foley is draining well, paged provider at this time to make him aware.  Advised to empty drainage bag and monitor for 2 hours and let provider know how urine looks.

## 2023-06-29 NOTE — Evaluation (Signed)
 Physical Therapy Evaluation Patient Details Name: Ian Aguilar MRN: [id number] DOB: 1940-07-13 Today's Date: 06/29/2023  History of Present Illness  Ian Aguilar is a 83 y.o. male with medical history significant of COPD, osteoarthritis of hip who presents to the emergency department via EMS from [facility] facility.  Patient was unable to provide any history possibly due to altered mental status or possible underlying dementia, history was obtained from ED physician and ED medical record.  Per report, patient patient has not been eating.  Son reported that patient was coherent 2 days ago (Tuesday 4/29), but is now altered.    He presented to the emergency department yesterday due to generalized weakness that was ongoing for about 2 weeks PTA.  He was previously able to assist with transfers, however, he was no longer able to stand on his own without assist and has also been reaching at things in the air that are not there, thereby raising concern for hallucinations.  Thyroid  levels checked about 2 weeks ago was noted to be abnormal, it was reported that he underwent a change in his Synthroid  dosing recently.   Clinical Impression  Patient at high risk for falls due to shakiness of legs when standing with knees buckling after taking a few steps forward/backwards at bedside. Patient tolerated sitting up in chair after therapy. Patient will benefit from continued skilled physical therapy in hospital and recommended venue below to increase strength, balance, endurance for safe ADLs and gait.          If plan is discharge home, recommend the following: A lot of help with bathing/dressing/bathroom;A lot of help with walking and/or transfers;Help with stairs or ramp for entrance;Assistance with cooking/housework   Can travel by private vehicle   No    Equipment Recommendations None recommended by PT  Recommendations for Other Services       Functional Status Assessment Patient has had a recent  decline in their functional status and demonstrates the ability to make significant improvements in function in a reasonable and predictable amount of time.     Precautions / Restrictions Precautions Precautions: Fall Restrictions Weight Bearing Restrictions Per Provider Order: No      Mobility  Bed Mobility Overal bed mobility: Needs Assistance Bed Mobility: Supine to Sit     Supine to sit: Min assist     General bed mobility comments: increased time, labored movement    Transfers Overall transfer level: Needs assistance Equipment used: Rolling walker (2 wheels) Transfers: Sit to/from Stand, Bed to chair/wheelchair/BSC Sit to Stand: Mod assist   Step pivot transfers: Mod assist, Max assist       General transfer comment: very shaky unsteady movement when standing    Ambulation/Gait Ambulation/Gait assistance: Mod assist, Max assist Gait Distance (Feet): 8 Feet Assistive device: Rolling walker (2 wheels) Gait Pattern/deviations: Decreased step length - right, Decreased step length - left, Decreased stride length, Knees buckling Gait velocity: slow     General Gait Details: limited to a few steps forward/backwards before having to sit due to buckling of knees with loss of balance  Stairs            Wheelchair Mobility     Tilt Bed    Modified Rankin (Stroke Patients Only)       Balance Overall balance assessment: Needs assistance Sitting-balance support: Feet supported, No upper extremity supported Sitting balance-Leahy Scale: Fair Sitting balance - Comments: fair/good seated at EOB   Standing balance support: Bilateral upper extremity supported, During  functional activity, Reliant on assistive device for balance Standing balance-Leahy Scale: Poor Standing balance comment: using RW                             Pertinent Vitals/Pain Pain Assessment Pain Assessment: No/denies pain    Home Living Family/patient expects to be  discharged to:: Assisted living                 Home Equipment: Agricultural consultant (2 wheels)      Prior Function Prior Level of Function : Patient poor historian/Family not available             Mobility Comments: Pt reports having a RW at home but also reported independent ambulation. ADLs Comments: Pt reports independence but is a poor historian currently.     Extremity/Trunk Assessment   Upper Extremity Assessment Upper Extremity Assessment: Defer to OT evaluation    Lower Extremity Assessment Lower Extremity Assessment: Generalized weakness    Cervical / Trunk Assessment Cervical / Trunk Assessment: Normal  Communication   Communication Communication: No apparent difficulties    Cognition Arousal: Alert Behavior During Therapy: WFL for tasks assessed/performed   PT - Cognitive impairments: Memory                         Following commands: Intact       Cueing Cueing Techniques: Verbal cues, Tactile cues     General Comments      Exercises     Assessment/Plan    PT Assessment Patient needs continued PT services  PT Problem List Decreased strength;Decreased activity tolerance;Decreased balance;Decreased mobility       PT Treatment Interventions DME instruction;Gait training;Stair training;Functional mobility training;Therapeutic activities;Therapeutic exercise;Balance training;Patient/family education    PT Goals (Current goals can be found in the Care Plan section)  Acute Rehab PT Goals Patient Stated Goal: return home PT Goal Formulation: With patient Time For Goal Achievement: 07/13/23 Potential to Achieve Goals: Good    Frequency Min 3X/week     Co-evaluation PT/OT/SLP Co-Evaluation/Treatment: Yes Reason for Co-Treatment: To address functional/ADL transfers PT goals addressed during session: Mobility/safety with mobility;Balance;Proper use of DME OT goals addressed during session: ADL's and self-care       AM-PAC PT  "6 Clicks" Mobility  Outcome Measure Help needed turning from your back to your side while in a flat bed without using bedrails?: A Little Help needed moving from lying on your back to sitting on the side of a flat bed without using bedrails?: A Lot Help needed moving to and from a bed to a chair (including a wheelchair)?: A Lot Help needed standing up from a chair using your arms (e.g., wheelchair or bedside chair)?: A Lot Help needed to walk in hospital room?: A Lot Help needed climbing 3-5 steps with a railing? : Total 6 Click Score: 12    End of Session   Activity Tolerance: Patient tolerated treatment well;Patient limited by fatigue Patient left: in chair;with call bell/phone within reach;with chair alarm set Nurse Communication: Mobility status PT Visit Diagnosis: Unsteadiness on feet (R26.81);Other abnormalities of gait and mobility (R26.89);Muscle weakness (generalized) (M62.81)    Time: 1191-4782 PT Time Calculation (min) (ACUTE ONLY): 21 min   Charges:   PT Evaluation $PT Eval Moderate Complexity: 1 Mod PT Treatments $Therapeutic Activity: 8-22 mins PT General Charges $$ ACUTE PT VISIT: 1 Visit         3:27 PM,  06/29/23 Walton Guppy, MPT Physical Therapist with [facility] [phone] office [phone] mobile phone

## 2023-06-29 NOTE — Plan of Care (Signed)
  Problem: Acute Rehab PT Goals(only PT should resolve) Goal: Pt Will Go Supine/Side To Sit Outcome: Progressing Flowsheets (Taken 06/29/2023 1528) Pt will go Supine/Side to Sit:  with contact guard assist  with supervision Goal: Patient Will Transfer Sit To/From Stand Outcome: Progressing Flowsheets (Taken 06/29/2023 1528) Patient will transfer sit to/from stand:  with contact guard assist  with supervision Goal: Pt Will Transfer Bed To Chair/Chair To Bed Outcome: Progressing Flowsheets (Taken 06/29/2023 1528) Pt will Transfer Bed to Chair/Chair to Bed: with min assist Goal: Pt Will Ambulate Outcome: Progressing Flowsheets (Taken 06/29/2023 1528) Pt will Ambulate:  25 feet  with minimal assist  with moderate assist  with rolling walker   3:28 PM, 06/29/23 Walton Guppy, MPT Physical Therapist with [facility] [phone] office [phone] mobile phone

## 2023-06-29 NOTE — TOC Initial Note (Signed)
 Transition of Care [facility]) - Initial/Assessment Note    Patient Details  Name: Ian Aguilar MRN: [id number] Date of Birth: 09-Apr-1940  [facility] CM/SW Contact:    Grandville Lax, LCSWA Phone Number: 06/29/2023, 11:10 AM  Clinical Narrative:                 CSW notes pt arrived from [facility]. CSW spoke to [place] with ALF who states pt is a resident with them. Pt peddles himself in his wheelchair. Pt can ambulate at baseline with a walker when staff is present. They are able to assist with completing ADLs. Pt is on a regular diet. Pt wears 2L O2 at baseline. TOC to follow.   Expected Discharge Plan: Assisted Living Barriers to Discharge: Continued Medical Work up   Patient Goals and CMS Choice Patient states their goals for this hospitalization and ongoing recovery are:: get better CMS Medicare.gov Compare Post Acute Care list provided to:: Patient Choice offered to / list presented to : Patient      Expected Discharge Plan and Services In-house Referral: Clinical Social Work Discharge Planning Services: CM Consult   Living arrangements for the past 2 months: Assisted Living Facility                                      Prior Living Arrangements/Services Living arrangements for the past 2 months: Assisted Living Facility Lives with:: Facility Resident Patient language and need for interpreter reviewed:: Yes Do you feel safe going back to the place where you live?: Yes      Need for Family Participation in Patient Care: Yes (Comment) Care giver support system in place?: Yes (comment) Current home services: DME Criminal Activity/Legal Involvement Pertinent to Current Situation/Hospitalization: No - Comment as needed  Activities of Daily Living   ADL Screening (condition at time of admission) Independently performs ADLs?: No Does the patient have a NEW difficulty with bathing/dressing/toileting/self-feeding that is expected to last >3 days?:  No Does the patient have a NEW difficulty with getting in/out of bed, walking, or climbing stairs that is expected to last >3 days?: No Does the patient have a NEW difficulty with communication that is expected to last >3 days?: No Is the patient deaf or have difficulty hearing?: No Does the patient have difficulty seeing, even when wearing glasses/contacts?: No Does the patient have difficulty concentrating, remembering, or making decisions?: Yes  Permission Sought/Granted                  Emotional Assessment         Alcohol / Substance Use: Not Applicable Psych Involvement: No (comment)  Admission diagnosis:  Dehydration [E86.0] Cyst of pancreas [K86.2] Altered mental status [R41.82] Dilated bile duct [K83.8] Altered mental status, unspecified altered mental status type [R41.82] Patient Active Problem List   Diagnosis Date Noted   Generalized weakness 06/29/2023   Failure to thrive in adult 06/29/2023   Pancreatic cyst 06/29/2023   Urinary retention 06/29/2023   Altered mental status 06/28/2023   Influenza A 05/09/2023   Dry eyes 05/08/2023   COPD exacerbation (HCC) 05/08/2023   Dementia without behavioral disturbance (HCC) 02/15/2023   Iron deficiency anemia 02/14/2021   Neurocognitive deficits 04/29/2020   Mixed hyperlipidemia 04/27/2020   Metacarpal bone fracture 04/27/2020   Aortic atherosclerosis (HCC) 04/27/2020   Chronic constipation 04/27/2020   Severe hypothyroidism 04/22/2020   Distention  of biliary tract 04/21/2020   AKI (acute kidney injury) (HCC) 04/21/2020   Acquired hypothyroidism 04/21/2020   Abdominal distension 04/20/2020   Acute encephalopathy 10/23/2019   Dehydration 10/23/2019   COPD with acute exacerbation (HCC) 10/23/2019   Chronic hypoxemic respiratory failure (HCC) 10/23/2019   Hypertensive urgency 10/23/2019   Goals of care, counseling/discussion    Palliative care by specialist    DNR (do not resuscitate) discussion    Malignant  neoplasm of bronchus and lung (HCC)    Carcinoma, lung (HCC) 06/05/2017   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   Hepatic cirrhosis (HCC) 04/06/2017   History of colonic polyps 04/06/2017   History of snoring 09/07/2015   Fracture of ulnar styloid    Benign essential HTN    Faintness    Thrombocytopenia (HCC) 09/03/2015   Hyponatremia 09/03/2015   Closed displaced intertrochanteric fracture of right femur (HCC)    Distal radius fracture, right 09/02/2015   Fracture of right hip requiring operative repair (HCC) 09/01/2015   Alcohol abuse 09/01/2015   Tobacco abuse 09/01/2015   COPD (chronic obstructive pulmonary disease) 09/01/2015   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   Weakness of left leg 01/10/2012   Difficulty walking 01/10/2012   Pain in left hip 01/10/2012   Acute blood loss anemia 12/21/2011   Cirrhosis- imaging diagnosis 2013 09/08/2011   Umbilical hernia 09/08/2011   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   Hepatomegaly 06/14/2011   Encounter for screening colonoscopy 06/14/2011   JOINT EFFUSION, KNEE 02/10/2008   KNEE PAIN 02/10/2008   PCP:  [name], MD Pharmacy:   [facility], [place] - [address] [place] [address] Phone: [phone] Fax: [phone]     Social Drivers of Health (SDOH) Social History: SDOH Screenings   Food Insecurity: Patient Unable To Answer (06/29/2023)  Housing: Low Risk  (06/29/2023)  Transportation Needs: Patient Unable To Answer (06/29/2023)  Utilities: Patient Unable To Answer (06/29/2023)  Social Connections: Patient Unable To Answer (06/29/2023)  Recent Concern: Social Connections - Socially Isolated (05/08/2023)  Tobacco Use: Medium Risk (06/28/2023)   SDOH Interventions:     Readmission Risk Interventions    06/29/2023   11:09 AM 05/09/2023    9:20 AM  Readmission Risk Prevention Plan  Transportation Screening Complete Complete  HRI or Home Care Consult Complete Complete   Social Work Consult for Recovery Care Planning/Counseling Complete Complete  Palliative Care Screening Not Applicable Not Applicable  Medication Review Oceanographer) Complete Complete

## 2023-06-29 NOTE — Progress Notes (Signed)
 Initial Nutrition Assessment  DOCUMENTATION CODES:   Not applicable  INTERVENTION:   -RD will follow for diet advancement and add supplements as appropriate; per RN, plan to re-assess swallow function in the AM and may consider SLP eval -If pt unable to safely take PO's, consider placement of NGT for nutrition if this aligns with goals of care:   Initiate Osmolite 1.5 @ 20 ml/hr and increase by 10 ml every 8 hours to goal rate of 50 ml/hr.   60 ml Prosource TF20 daily  Tube feeding regimen provides 1880 kcal (100% of needs), 95 grams of protein, and 914 ml of H2O.    NUTRITION DIAGNOSIS:   Inadequate oral intake related to inability to eat as evidenced by NPO status.  GOAL:   Patient will meet greater than or equal to 90% of their needs  MONITOR:   Diet advancement  REASON FOR ASSESSMENT:   Consult Assessment of nutrition requirement/status  ASSESSMENT:   Pt with medical history significant of COPD, osteoarthritis of hip who presents with AMS and FTT.  Pt admitted with FTT and AMS.   Reviewed I/O's: +130 ml x 24 hours  UOP: 1.7 L x 24 hours  Pt unavailable at time of visit. Attempted to speak with pt via call to hospital room phone, however, unable to reach. RD unable to obtain further nutrition-related history or complete nutrition-focused physical exam at this time.    Per H&P, pt resides at [facility] and has not been eating. Pt was coherent 2 days PTA, but altered on day of admission. Pt typically pedals himself in a wheelchair, but can ambulate with walker with staff assistance. He was on a regular diet PTA.   Pt currently oriented to self only.   Case discussed with RN and MD. Noted that pt was able to participate in PT and OT today. RD discussed potential for pt to be advanced to diet vs consulting speech for further swallow safely evaluation. Per RN, pt would benefit from a speech eval, but plan to reassess in the AM as pt is drowsy from ativan  given for  MRI. If pt unable to safely take PO's, may need to consider NGT for nutrition if this aligns with pt's goals of care.   Reviewed wt hx; pt has experienced a 1.4% wt loss over the past 4 months, which is not significant for time frame.   Medications reviewed and include lovenox , and rocephin .    Labs reviewed: Mg: 1.6, CBGS: 112 (inpatient orders for glycemic control are none).    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  Estimated Nutritional Needs:   Kcal:  1750-1950  Protein:  90-105 grams  Fluid:  1.7-1.9 L    Herschel Lords, RD, LDN, CDCES Registered Dietitian III Certified  Diabetes Care and Education Specialist If unable to reach this RD, please use "RD Inpatient" group chat on secure chat between hours of 8am-4 pm daily

## 2023-06-29 NOTE — Consult Note (Signed)
 Urology Consult  Referring physician: Dr. Quintella Buck Reason for referral: gross hematuria  Chief Complaint: gross hematuria  History of Present Illness: Ian Aguilar is a 83yo with a history of COPD, Cirrhosis and BPH admitted with altered mental status. In the ER he was found to be in urinary retention and a foley was placed. Soon after foley placement his urine turned bloody. Currently his foley is draining dark red urine. He is a poor history and cannot provide additional urologic history. Urine culture is pending. WBC count 7.7.  Ct abd/pelvis from yesterday shows a distended bladder with perivesical stranding concerning for cystitis.   Past Medical History:  Diagnosis Date   Anxiety    Arthritis    Cancer (HCC)    Cirrhosis (HCC)    confirmed by MRI on 07/04/11.     COPD (chronic obstructive pulmonary disease) (HCC)    Depression with anxiety    ETOH abuse    Hyperlipidemia    Hypertension    diet control   Nodule of upper lobe of right lung    with mediastinal adenopathy   Wears dentures    Wears glasses    Past Surgical History:  Procedure Laterality Date   bilateral cataract surgery     [place]   broken arm  6 yrs ago   left otif of wrist-[place]   CLOSED REDUCTION WRIST FRACTURE Right 09/02/2015   Procedure: RIGHT WRIST REDUCTION;  Surgeon: Saundra Curl, MD;  Location: [facility] OR;  Service: Orthopedics;  Laterality: Right;  with MAC   COLONOSCOPY  1996   Rehman: external hemorrhoids, no polyps   COLONOSCOPY  06/28/2011   Dr. Rudolpho Costa diverticulosis,tubular adenoma, hyperplastic polyp   COLONOSCOPY WITH PROPOFOL  N/A 04/26/2017   Dr. Riley Cheadle: perianal and digital rectal examinations normal, diffusely congested colonic mucosa but otherwise normal   ESOPHAGOGASTRODUODENOSCOPY  06/28/2011   Dr. Cleora Daft erosive reflux esophagitis, hiatal hernia-gastritis   ESOPHAGOGASTRODUODENOSCOPY (EGD) WITH PROPOFOL  N/A 04/26/2017   Dr. Riley Cheadle: normal esophagus, small hiatal hernia, GAVE,  portal hypertensive gastropathy, normal duodenal bulb and second portion, no specimens collected. 2 years screening    ESOPHAGOGASTRODUODENOSCOPY (EGD) WITH PROPOFOL  N/A 06/19/2019   normal esophagus, portal gastropathy, GAVE, normal duodenum. 2 years screening.    EXTERNAL EAR SURGERY     right ear-cleaned out ear and created new eardrum   INGUINAL HERNIA REPAIR  2-3 yrs ago   left-[facility]   INTRAMEDULLARY (IM) NAIL INTERTROCHANTERIC Right 09/02/2015   Procedure: RIGHT  INTERTROCHANTRIC HIP;  Surgeon: Saundra Curl, MD;  Location: [facility] OR;  Service: Orthopedics;  Laterality: Right;  With MAC   KNEE SURGERY     left-arthroscopy-[place]   PORTACATH PLACEMENT Left 06/08/2017   Procedure: INSERTION POWER PORT WITH  ATTACHED CATHETER LEFT SUBCLAVIAN;  Surgeon: Alanda Allegra, MD;  Location: [facility];  Service: General;  Laterality: Left;   SKULL FRACTURE ELEVATION     fractured cheeck bone repaired   TOTAL HIP ARTHROPLASTY  12/18/2011   Procedure: TOTAL HIP ARTHROPLASTY;  Surgeon: Darrin Emerald, MD;  Location: [facility];  Service: Orthopedics;  Laterality: Left;   VIDEO BRONCHOSCOPY WITH ENDOBRONCHIAL ULTRASOUND N/A 05/04/2017   Procedure: VIDEO BRONCHOSCOPY WITH ENDOBRONCHIAL ULTRASOUND;  Surgeon: Zelphia Higashi, MD;  Location: [facility] OR;  Service: Thoracic;  Laterality: N/A;   WISDOM TOOTH EXTRACTION      Medications: I have reviewed the patient's current medications. Allergies: No Known Allergies  Family History  Problem Relation Age of Onset   Stroke Mother 83  Heart disease Father 56       MI   Diabetes Maternal Uncle    Colon cancer Neg Hx    Cancer Neg Hx    Social History:  reports that he quit smoking about 66 years ago. His smoking use included cigarettes. He has never used smokeless tobacco. He reports that he does not currently use alcohol after a past usage of about 2.0 standard drinks of alcohol per week. He reports that he does not use drugs.  Review of Systems   Unable to perform ROS: Mental status change  Genitourinary:  Positive for hematuria.    Physical Exam:  Vital signs in last 24 hours: Temp:  [97.6 F (36.4 C)-98.9 F (37.2 C)] 97.7 F (36.5 C) (05/02 1525) Pulse Rate:  [63-94] 64 (05/02 1600) Resp:  [13-19] 15 (05/02 1200) BP: (106-188)/(41-127) 129/41 (05/02 1600) SpO2:  [97 %-100 %] 99 % (05/02 1600) FiO2 (%):  [28 %] 28 % (05/02 0043) Weight:  [70.2 kg] 70.2 kg (05/02 1006) Physical Exam Vitals reviewed.  Constitutional:      Appearance: Normal appearance.  HENT:     Head: Normocephalic and atraumatic.     Mouth/Throat:     Mouth: Mucous membranes are dry.  Eyes:     Extraocular Movements: Extraocular movements intact.     Pupils: Pupils are equal, round, and reactive to light.  Cardiovascular:     Rate and Rhythm: Normal rate and regular rhythm.  Pulmonary:     Effort: Pulmonary effort is normal. No respiratory distress.  Abdominal:     General: Abdomen is flat. There is no distension.  Genitourinary:    Penis: Normal.      Testes: Normal.  Musculoskeletal:        General: Normal range of motion.     Cervical back: Normal range of motion.  Skin:    General: Skin is warm and dry.  Neurological:     Mental Status: He is disoriented.     Laboratory Data:  Results for orders placed or performed during the hospital encounter of 06/28/23 (from the past 72 hours)  CBC with Differential     Status: Abnormal   Collection Time: 06/28/23  6:43 PM  Result Value Ref Range   WBC 10.1 4.0 - 10.5 K/uL   RBC 4.55 4.22 - 5.81 MIL/uL   Hemoglobin 14.4 13.0 - 17.0 g/dL   HCT 78.2 95.6 - 21.3 %   MCV 93.4 80.0 - 100.0 fL   MCH 31.6 26.0 - 34.0 pg   MCHC 33.9 30.0 - 36.0 g/dL   RDW 08.6 57.8 - 46.9 %   Platelets 244 150 - 400 K/uL   nRBC 0.0 0.0 - 0.2 %   Neutrophils Relative % 89 %   Neutro Abs 9.0 (H) 1.7 - 7.7 K/uL   Lymphocytes Relative 5 %   Lymphs Abs 0.5 (L) 0.7 - 4.0 K/uL   Monocytes Relative 6 %   Monocytes  Absolute 0.6 0.1 - 1.0 K/uL   Eosinophils Relative 0 %   Eosinophils Absolute 0.0 0.0 - 0.5 K/uL   Basophils Relative 0 %   Basophils Absolute 0.0 0.0 - 0.1 K/uL   Immature Granulocytes 0 %   Abs Immature Granulocytes 0.02 0.00 - 0.07 K/uL    Comment: Performed at [facility], [address], [place], [place] [address]  Comprehensive metabolic panel     Status: Abnormal   Collection Time: 06/28/23  6:43 PM  Result Value Ref Range  Sodium 133 (L) 135 - 145 mmol/L   Potassium 4.0 3.5 - 5.1 mmol/L   Chloride 96 (L) 98 - 111 mmol/L   CO2 25 22 - 32 mmol/L   Glucose, Bld 101 (H) 70 - 99 mg/dL    Comment: Glucose reference range applies only to samples taken after fasting for at least 8 hours.   BUN 17 8 - 23 mg/dL   Creatinine, Ser 6.29 0.61 - 1.24 mg/dL   Calcium  9.1 8.9 - 10.3 mg/dL   Total Protein 6.8 6.5 - 8.1 g/dL   Albumin 3.6 3.5 - 5.0 g/dL   AST 24 15 - 41 U/L   ALT 13 0 - 44 U/L   Alkaline Phosphatase 74 38 - 126 U/L   Total Bilirubin 1.6 (H) 0.0 - 1.2 mg/dL   GFR, Estimated >52 >84 mL/min    Comment: (NOTE) Calculated using the CKD-EPI Creatinine Equation (2021)    Anion gap 12 5 - 15    Comment: Performed at [facility], [address], [place], [place] [address]  Magnesium      Status: None   Collection Time: 06/28/23  6:43 PM  Result Value Ref Range   Magnesium  1.9 1.7 - 2.4 mg/dL    Comment: Performed at [facility], [address] [place], [place] [address]  Troponin I (High Sensitivity)     Status: None   Collection Time: 06/28/23  6:43 PM  Result Value Ref Range   Troponin I (High Sensitivity) 15 <18 ng/L    Comment: (NOTE) Elevated high sensitivity troponin I (hsTnI) values and significant  changes across serial measurements may suggest ACS but many other  chronic and acute conditions are known to elevate hsTnI results.  Refer to the "Links" section for chest pain algorithms and additional  guidance. Performed at [facility], [address],  [place], [place] [address]   Urinalysis, Routine w reflex microscopic -Urine, Clean Catch     Status: Abnormal   Collection Time: 06/28/23  9:00 PM  Result Value Ref Range   Color, Urine YELLOW YELLOW   APPearance CLEAR CLEAR   Specific Gravity, Urine >1.046 (H) 1.005 - 1.030   pH 5.0 5.0 - 8.0   Glucose, UA NEGATIVE NEGATIVE mg/dL   Hgb urine dipstick NEGATIVE NEGATIVE   Bilirubin Urine NEGATIVE NEGATIVE   Ketones, ur 20 (A) NEGATIVE mg/dL   Protein, ur 440 (A) NEGATIVE mg/dL   Nitrite NEGATIVE NEGATIVE   Leukocytes,Ua NEGATIVE NEGATIVE   RBC / HPF 0-5 0 - 5 RBC/hpf   WBC, UA 0-5 0 - 5 WBC/hpf   Bacteria, UA NONE SEEN NONE SEEN   Squamous Epithelial / HPF 0-5 0 - 5 /HPF   Mucus PRESENT     Comment: Performed at [facility], [address] [place], [place] [address]  Troponin I (High Sensitivity)     Status: None   Collection Time: 06/28/23  9:19 PM  Result Value Ref Range   Troponin I (High Sensitivity) 16 <18 ng/L    Comment: (NOTE) Elevated high sensitivity troponin I (hsTnI) values and significant  changes across serial measurements may suggest ACS but many other  chronic and acute conditions are known to elevate hsTnI results.  Refer to the "Links" section for chest pain algorithms and additional  guidance. Performed at [facility], [address] [place], [place] [address]   Comprehensive metabolic panel     Status: Abnormal   Collection Time: 06/29/23  3:48 AM  Result Value Ref Range  Sodium 135 135 - 145 mmol/L   Potassium 3.6 3.5 - 5.1 mmol/L   Chloride 97 (L) 98 - 111 mmol/L   CO2 29 22 - 32 mmol/L   Glucose, Bld 87 70 - 99 mg/dL    Comment: Glucose reference range applies only to samples taken after fasting for at least 8 hours.   BUN 15 8 - 23 mg/dL   Creatinine, Ser 4.09 0.61 - 1.24 mg/dL   Calcium  8.7 (L) 8.9 - 10.3 mg/dL   Total Protein 5.7 (L) 6.5 - 8.1 g/dL   Albumin 3.1 (L) 3.5 - 5.0 g/dL   AST 19 15 - 41 U/L   ALT 12 0 - 44 U/L   Alkaline Phosphatase  60 38 - 126 U/L   Total Bilirubin 1.2 0.0 - 1.2 mg/dL   GFR, Estimated >81 >19 mL/min    Comment: (NOTE) Calculated using the CKD-EPI Creatinine Equation (2021)    Anion gap 9 5 - 15    Comment: Performed at [facility], [address] [place], [place] [address]  CBC     Status: Abnormal   Collection Time: 06/29/23  3:48 AM  Result Value Ref Range   WBC 7.7 4.0 - 10.5 K/uL   RBC 3.96 (L) 4.22 - 5.81 MIL/uL   Hemoglobin 12.2 (L) 13.0 - 17.0 g/dL   HCT 95.6 (L) 21.3 - 08.6 %   MCV 92.7 80.0 - 100.0 fL   MCH 30.8 26.0 - 34.0 pg   MCHC 33.2 30.0 - 36.0 g/dL   RDW 57.8 46.9 - 62.9 %   Platelets 229 150 - 400 K/uL   nRBC 0.0 0.0 - 0.2 %    Comment: Performed at [facility], [address] [place], [place] [address]  Magnesium      Status: Abnormal   Collection Time: 06/29/23  3:48 AM  Result Value Ref Range   Magnesium  1.6 (L) 1.7 - 2.4 mg/dL    Comment: Performed at [facility], [address] [place], [place] [address]  Phosphorus     Status: None   Collection Time: 06/29/23  3:48 AM  Result Value Ref Range   Phosphorus 3.2 2.5 - 4.6 mg/dL    Comment: Performed at [facility], [address] [place], [place] [address]  Culture, blood (Routine X 2) w Reflex to ID Panel     Status: None (Preliminary result)   Collection Time: 06/29/23  5:50 PM   Specimen: BLOOD  Result Value Ref Range   Specimen Description BLOOD RIGHT ANTECUBITAL    Special Requests      BOTTLES DRAWN AEROBIC ONLY Blood Culture results may not be optimal due to an inadequate volume of blood received in culture bottles Performed at [facility], [address] [place], [place] [address]    Culture PENDING    Report Status PENDING   Culture, blood (Routine X 2) w Reflex to ID Panel     Status: None (Preliminary result)   Collection Time: 06/29/23  6:05 PM   Specimen: BLOOD  Result Value Ref Range   Specimen Description BLOOD BLOOD RIGHT HAND    Special Requests      BOTTLES DRAWN AEROBIC ONLY Blood  Culture results may not be optimal due to an inadequate volume of blood received in culture bottles Performed at [facility], [address], [place], [place] [address]    Culture PENDING    Report Status PENDING    Recent Results (from the past 240 hours)  Culture, blood (  Routine X 2) w Reflex to ID Panel     Status: None (Preliminary result)   Collection Time: 06/29/23  5:50 PM   Specimen: BLOOD  Result Value Ref Range Status   Specimen Description BLOOD RIGHT ANTECUBITAL  Final   Special Requests   Final    BOTTLES DRAWN AEROBIC ONLY Blood Culture results may not be optimal due to an inadequate volume of blood received in culture bottles Performed at [facility], [address] [place], [place] [address]    Culture PENDING  Incomplete   Report Status PENDING  Incomplete  Culture, blood (Routine X 2) w Reflex to ID Panel     Status: None (Preliminary result)   Collection Time: 06/29/23  6:05 PM   Specimen: BLOOD  Result Value Ref Range Status   Specimen Description BLOOD BLOOD RIGHT HAND  Final   Special Requests   Final    BOTTLES DRAWN AEROBIC ONLY Blood Culture results may not be optimal due to an inadequate volume of blood received in culture bottles Performed at [facility], [address], [place], [place] [address]    Culture PENDING  Incomplete   Report Status PENDING  Incomplete   Creatinine: Recent Labs    06/27/23 1628 06/28/23 1843 06/29/23 0348  CREATININE 0.93 0.86 0.82   Baseline Creatinine: 0.8  Impression/Assessment:  83yo with BPH with urinary retention, gross hematuria, possible UTI  Plan:  BPh with urinary retention: Please continue indwelling foley catheter. He will require bladder rest for 2 weeks prior to attempting a voiding trial. Flomax 0.4mg  daily started today., Gross hematuria: The patient hematuria is likely a combination of acute cystitis and rapid decompression of his distended bladder after foley placement. Currently the foley is  draining and he does not require Continuous Bladder Irrigation. Please continue rocephin  pending his urine culture. His hematuria should resolve in 24-48 hours  Ian Aguilar 06/29/2023, 7:13 PM

## 2023-06-29 NOTE — Evaluation (Signed)
 Occupational Therapy Evaluation Patient Details Name: Ian Aguilar MRN: [id number] DOB: [birth year] Today's Date: 06/29/2023   History of Present Illness   Ian Aguilar is a 83 y.o. male with medical history significant of COPD, osteoarthritis of hip who presents to the emergency department via EMS from [facility] facility.  Patient was unable to provide any history possibly due to altered mental status or possible underlying dementia, history was obtained from ED physician and ED medical record.  Per report, patient patient has not been eating.  Son reported that patient was coherent 2 days ago (Tuesday 4/29), but is now altered.    He presented to the emergency department yesterday due to generalized weakness that was ongoing for about 2 weeks PTA.  He was previously able to assist with transfers, however, he was no longer able to stand on his own without assist and has also been reaching at things in the air that are not there, thereby raising concern for hallucinations.  Thyroid  levels checked about 2 weeks ago was noted to be abnormal, it was reported that he underwent a change in his Synthroid  dosing recently. (per DO)     Clinical Impressions Pt agreeable to OT and PT co-evaluation. Pt oriented to self only. Pt was able to follow commands with verbal and tactile cuing. Min A for bed mobility and mod to max A for transfer to chair with RW. Pt very unsteady in standing with jerky steps with deficits in fluidity of coordination. Good B UE strength and A/ROM. Mod A for lower body dressing based on difficulty donning sock seated at EOB. Pt left in the chair with call bell within reach and chair alarm set. Pt will benefit from continued OT in the hospital and recommended venue below to increase strength, balance, and endurance for safe ADL's.        If plan is discharge home, recommend the following:   A lot of help with walking and/or transfers;A lot of help with  bathing/dressing/bathroom;Assistance with cooking/housework;Assist for transportation;Help with stairs or ramp for entrance;Direct supervision/assist for medications management     Functional Status Assessment   Patient has had a recent decline in their functional status and demonstrates the ability to make significant improvements in function in a reasonable and predictable amount of time.     Equipment Recommendations   None recommended by OT             Precautions/Restrictions   Precautions Precautions: Fall Recall of Precautions/Restrictions: Intact Restrictions Weight Bearing Restrictions Per Provider Order: No     Mobility Bed Mobility Overal bed mobility: Needs Assistance Bed Mobility: Supine to Sit     Supine to sit: Min assist     General bed mobility comments: unsteady movement; single hand held assist and extended time.    Transfers Overall transfer level: Needs assistance Equipment used: Rolling walker (2 wheels) Transfers: Sit to/from Stand, Bed to chair/wheelchair/BSC Sit to Stand: Mod assist     Step pivot transfers: Mod assist, Max assist     General transfer comment: Mod A to boost from EOB; jerky and unsteady steps to chair with RW; weak B LE with pt's legs giving out once during initial sit to stand.      Balance Overall balance assessment: Needs assistance Sitting-balance support: No upper extremity supported, Feet supported Sitting balance-Leahy Scale: Fair Sitting balance - Comments: fair to good seated at EOB   Standing balance support: Bilateral upper extremity supported, During functional activity, Reliant on assistive device  for balance Standing balance-Leahy Scale: Poor Standing balance comment: using RW                           ADL either performed or assessed with clinical judgement   ADL Overall ADL's : Needs assistance/impaired     Grooming: Set up;Contact guard assist;Sitting   Upper Body Bathing:  Set up;Contact guard assist;Sitting   Lower Body Bathing: Moderate assistance;Sitting/lateral leans   Upper Body Dressing : Sitting;Set up;Contact guard assist   Lower Body Dressing: Moderate assistance;Sitting/lateral leans Lower Body Dressing Details (indicate cue type and reason): Mod A to doff and don sock seated at EOB. Unable to don without assist. Toilet Transfer: Moderate assistance;Maximal assistance;Stand-pivot;Ambulation;Rolling walker (2 wheels) Toilet Transfer Details (indicate cue type and reason): Simulated via EOB to chair transfer and short distance ambulation in the room. Toileting- Clothing Manipulation and Hygiene: Moderate assistance;Sitting/lateral lean       Functional mobility during ADLs: Moderate assistance;Maximal assistance;Rolling walker (2 wheels) General ADL Comments: Pt able to take a few steps forward and backwards with RW.     Vision Baseline Vision/History: 1 Wears glasses Ability to See in Adequate Light: 1 Impaired Vision Assessment?: No apparent visual deficits     Perception Perception: Not tested       Praxis Praxis: Not tested       Pertinent Vitals/Pain Pain Assessment Pain Assessment: No/denies pain     Extremity/Trunk Assessment Upper Extremity Assessment Upper Extremity Assessment: Overall WFL for tasks assessed   Lower Extremity Assessment Lower Extremity Assessment: Defer to PT evaluation   Cervical / Trunk Assessment Cervical / Trunk Assessment: Normal   Communication Communication Communication: No apparent difficulties   Cognition Arousal: Alert Behavior During Therapy: WFL for tasks assessed/performed Cognition: No family/caregiver present to determine baseline             OT - Cognition Comments: Pt oriented to self only. No family or staff present to provide baseline cognitive level information.                 Following commands: Intact       Cueing  General Comments   Cueing Techniques:  Verbal cues;Tactile cues                 Home Living Family/patient expects to be discharged to:: Assisted living                             Home Equipment: Rolling Walker (2 wheels)          Prior Functioning/Environment Prior Level of Function : Patient poor historian/Family not available             Mobility Comments: Pt reports having a RW at home but also reported independent ambulation. ADLs Comments: Pt reports independence but is a poor historian currently.    OT Problem List: Decreased strength;Decreased activity tolerance;Impaired balance (sitting and/or standing);Decreased cognition;Decreased knowledge of use of DME or AE   OT Treatment/Interventions: Self-care/ADL training;Therapeutic exercise;Therapeutic activities;Patient/family education;Balance training;DME and/or AE instruction      OT Goals(Current goals can be found in the care plan section)   Acute Rehab OT Goals Patient Stated Goal: improve function OT Goal Formulation: With patient Time For Goal Achievement: 07/13/23 Potential to Achieve Goals: Good   OT Frequency:  Min 2X/week    Co-evaluation PT/OT/SLP Co-Evaluation/Treatment: Yes Reason for Co-Treatment: To address functional/ADL transfers  OT goals addressed during session: ADL's and self-care                       End of Session Equipment Utilized During Treatment: Rolling walker (2 wheels);Gait belt Nurse Communication: Mobility status  Activity Tolerance: Patient tolerated treatment well Patient left: in chair;with call bell/phone within reach;with chair alarm set  OT Visit Diagnosis: Unsteadiness on feet (R26.81);Other abnormalities of gait and mobility (R26.89);Muscle weakness (generalized) (M62.81);Other symptoms and signs involving cognitive function                Time: 5409-8119 OT Time Calculation (min): 18 min Charges:  OT General Charges $OT Visit: 1 Visit OT Evaluation $OT Eval Low  Complexity: 1 Low  Danahi Reddish OT, MOT  Thurnell Floss 06/29/2023, 1:51 PM

## 2023-06-30 DIAGNOSIS — F0390 Unspecified dementia without behavioral disturbance: Secondary | ICD-10-CM

## 2023-06-30 DIAGNOSIS — R339 Retention of urine, unspecified: Secondary | ICD-10-CM | POA: Diagnosis not present

## 2023-06-30 DIAGNOSIS — R410 Disorientation, unspecified: Secondary | ICD-10-CM | POA: Diagnosis not present

## 2023-06-30 DIAGNOSIS — R627 Adult failure to thrive: Secondary | ICD-10-CM | POA: Diagnosis not present

## 2023-06-30 DIAGNOSIS — E039 Hypothyroidism, unspecified: Secondary | ICD-10-CM | POA: Diagnosis not present

## 2023-06-30 LAB — MRSA NEXT GEN BY PCR, NASAL: MRSA by PCR Next Gen: NOT DETECTED

## 2023-06-30 LAB — URINE CULTURE

## 2023-06-30 LAB — T3, FREE: T3, Free: 2.1 pg/mL (ref 2.0–4.4)

## 2023-06-30 LAB — AMMONIA: Ammonia: 26 umol/L (ref 9–35)

## 2023-06-30 MED ORDER — SODIUM CHLORIDE 0.9 % IV SOLN: INTRAVENOUS | Status: DC

## 2023-06-30 MED ORDER — ENSURE ENLIVE PO LIQD
237.0000 mL | Freq: Two times a day (BID) | ORAL | Status: DC
  Administered 2023-07-01 – 2023-07-04 (×6): 237 mL via ORAL

## 2023-06-30 NOTE — Plan of Care (Signed)

## 2023-06-30 NOTE — Plan of Care (Signed)
  Problem: Activity: Goal: Risk for activity intolerance will decrease Outcome: Not Progressing   Problem: Nutrition: Goal: Adequate nutrition will be maintained Outcome: Not Progressing   

## 2023-06-30 NOTE — Progress Notes (Signed)
 Foley cath irrigated due to low output through the day. Around 200 ml since 0630 this morning. Urine still pink tinged but getting lighter. No clots observed coming from the cath during irrigation. MD notified.

## 2023-06-30 NOTE — Progress Notes (Addendum)
 PROGRESS NOTE  Ian Aguilar, is a 83 y.o. male, DOB - 06-03-1940, ZOX:[id number]  Admit date - 06/28/2023   Admitting Physician Ian Galea, DO  Outpatient Primary MD for the patient is Ian Amel, MD  LOS - 2  Chief Complaint  Patient presents with   Failure To Thrive      Brief Narrative:  83 y.o. male with medical history significant of COPD, osteoarthritis of hip admitted on 06/28/2023 from [facility] [place] [facility] with acute metabolic encephalopathy and generalized weakness with failure to thrive/urinary retention and hematuria   -Assessment and Plan: 1)Acute Metabolic Encephalopathy--??  Etiology - Chest x-ray, CT abdomen and pelvis as well as MRI/MRCP abdomen without evidence of acute infection - UA is not suggestive of UTI - No leukocytosis, no fevers - Patient received IV Rocephin  in the ED -Urine and blood cultures NGTD - Okay to hold off on further antibiotics at this time -   2)AbNormal Abd Imaging--- CT abdomen and pelvis and MRCP shows liver surface nodularity, compatible with cirrhosis. No discrete focal liver lesion seen. No ascites or Splenomegaly, as well as multiple peribiliary cysts, which are nonspecific but usually seen in the setting of severe chronic liver disease, portal hypertension, ADPKD, etc. 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   3)Generalized weakness and deconditioning/Falls-- per patient's daughter, and staff at [facility]--until recently patient was able to feed himself, toilet himself and perform most of his ADLs independently. -Until Recently patient was able to ambulate with a walker for short distances, he independently propelled himself in a wheelchair when he had to go longer distances. -- Currently he is very weak, deconditioned, with significant ambulatory dysfunction and recently had a fall at his ALF  facility. -- Patient will benefit from dedicated rehab sessions to help him regain his mobility, endurance and strength. It is felt that patient needs rehab services to restore this patient to their prior level of function to achieve safe transition back to home care. This patient needs rehab services for at least 5 days per week and skilled nursing services daily to facilitate this transition. Rehab is being requested as the most appropriate d/c option for this patient and is NOT felt to be for custodial care as evidenced by previously independent with ADLs including not limited to feeding, toileting, ambulation/independent transfers prior to admission. 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 Barriers: Not Clinically Stable-   Code Status : -  Code Status: Do not attempt resuscitation (DNR) PRE-ARREST INTERVENTIONS DESIRED   Family Communication:   Discussed with with Daughter Ian Bates [place] -[place] -I called and updated patient's daughter on 06/29/2023 and again on 06/30/2023 DVT Prophylaxis  :   - SCDs   enoxaparin  (LOVENOX ) injection 40 mg Start: 06/29/23 1000 SCDs Start: 06/29/23 0043  Lab Results  Component Value Date   PLT 229 06/29/2023   Inpatient Medications  Scheduled Meds:  Chlorhexidine  Gluconate Cloth  6 each Topical Q0600   donepezil   10 mg Oral QHS   enoxaparin  (LOVENOX ) injection  40 mg Subcutaneous Q24H   [START ON 07/01/2023] levothyroxine   75 mcg Oral Q0600   memantine   5 mg Oral BID   rosuvastatin   10 mg Oral Q2200   tamsulosin  0.4 mg Oral QPC supper   Continuous Infusions: PRN Meds:.albuterol , LORazepam , ondansetron  **OR** ondansetron  (ZOFRAN ) IV   Anti-infectives (From admission, onward)    Start     Dose/Rate Route Frequency Ordered Stop   06/29/23 1415  cefTRIAXone  (ROCEPHIN ) 2 g in sodium chloride  0.9 % 100 mL IVPB  Status:  Discontinued        2 g 200 mL/hr over 30 Minutes Intravenous Every 24 hours 06/29/23 1319 06/29/23 1658      Subjective: Ian Aguilar today has no fevers, no emesis,  No chest pain,    -No further significant agitation or restlessness  - Trying  to feed himself but appears pretty weak,  needs help with ADLs at this time which is not his baseline - I called and updated patient's daughter on 06/29/2023 and again on 06/30/2023  Objective: Vitals:   06/30/23 0800 06/30/23 1100 06/30/23 1143 06/30/23 1200  BP:    (!) 167/86  Pulse:    78  Resp:    (!) 23  Temp:   97.9 F (36.6 C)   TempSrc:   Oral   SpO2: 99% 90%  92%  Weight:      Height:        Intake/Output Summary (Last 24 hours) at 06/30/2023 1440 Last data filed at 06/30/2023 1210 Gross per 24 hour  Intake 2036.53 ml  Output 625 ml  Net 1411.53 ml    Filed Weights   06/29/23 1006  Weight: 70.2 kg    Physical Exam Gen:- Awake ,Alert, more coherent and cooperative HEENT:- [place].AT, No sclera icterus Neck-Supple Neck,No JVD,.  Lungs-  CTAB , fair symmetrical air movement CV- S1, S2 normal, regular , Lt sided [place] Cath in situ Abd-  +ve B.Sounds, Abd Soft, No tenderness,    Extremity/Skin:- No  edema, pedal pulses present  Psych-cognitive and memory deficits consistent with underlying dementia , no further significant agitation or restlessness  Neuro-Generalized Weakness , No new focal deficits, no tremors GU---Foley with Hematuria  Data Reviewed: I have personally reviewed following labs and imaging studies  CBC: Recent Labs  Lab 06/27/23 1628 06/28/23 1843 06/29/23 0348  WBC 7.6 10.1 7.7  NEUTROABS 6.6 9.0*  --   HGB 13.6 14.4 12.2*  HCT 41.3 42.5 36.7*  MCV 94.9 93.4 92.7  PLT 245 244 229   Basic Metabolic Panel: Recent Labs  Lab 06/27/23 1628  06/28/23 1843 06/29/23 0348  NA 137 133* 135  K 3.8 4.0 3.6  CL 98 96* 97*  CO2 28 25 29   GLUCOSE 101* 101* 87  BUN 19 17 15   CREATININE 0.93 0.86 0.82  CALCIUM  9.3 9.1 8.7*  MG 1.9 1.9 1.6*  PHOS  --   --  3.2   GFR: Estimated Creatinine Clearance: 69 mL/min (by C-G formula based on SCr of 0.82 mg/dL). Liver Function Tests: Recent Labs  Lab 06/27/23 1628 06/28/23 1843 06/29/23 0348  AST 18 24 19   ALT 11 13 12   ALKPHOS 75 74 60  BILITOT 1.1 1.6* 1.2  PROT 6.9 6.8 5.7*  ALBUMIN 3.8 3.6 3.1*    Radiology Studies: MR ABDOMEN MRCP W WO CONTAST Result Date: 06/29/2023 CLINICAL DATA:  Gallbladder/biliary cancer, monitor; Gallbladder/biliary cancer, monitor [phone] Abnormal CT of the abdomen 220388. EXAM: MRI ABDOMEN WITHOUT AND WITH CONTRAST (INCLUDING MRCP) TECHNIQUE: Multiplanar multisequence MR imaging of the abdomen was performed both before and after the administration of intravenous contrast. Heavily T2-weighted images of the biliary and pancreatic ducts  were obtained, and three-dimensional MRCP images were rendered by post processing. CONTRAST:  7mL GADAVIST  GADOBUTROL  1 MMOL/ML IV SOLN COMPARISON:  CT scan abdomen and pelvis from 06/28/2023. FINDINGS: Note examination is mild-to-moderately limited due to patient's motion during data acquisition. Lower chest: Unremarkable MR appearance to the lung bases. No pleural effusion. No pericardial effusion. Normal heart size. Hepatobiliary: Redemonstration of liver surface nodularity, compatible with cirrhosis. No discrete focal liver lesion seen. Redemonstration of multiple well-circumscribed cystic lesions mainly surrounding the left lobe bile ducts but small amount of cysts are also seen surrounding the right lobe bile ducts as well as at the confluence. The MRCP images are motion degraded but these cystic lesions are likely separate to the adjacent bile ducts, which are not dilated. These are favored to represent peribiliary cysts which usually occurs in the setting of severe chronic liver disease, portal hypertension, ADPKD, etc. There is interval increase in the size and number of cysts when compared to the prior MRI from 04/21/2020. The extrahepatic bile ducts are not dilated. No choledocholithiasis. No obstructing mass seen. Unremarkable gallbladder. Pancreas: Small/atrophic pancreas. Slightly prominent main pancreatic duct. There are 2 cystic lesions in the pancreatic head/uncinate process (measuring 9 x 14 mm) and pancreatic body (measuring 9 x 9 mm), which previously measured approximately 8 x 11 mm and 9 x 10 mm, respectively. The lesions are nonenhancing. 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 Continued follow-up is recommended in 2 years to document stability. Spleen:  Within normal limits in size and appearance. No focal mass. Adrenals/Urinary Tract: Unremarkable adrenal glands.  No hydroureteronephrosis. No suspicious renal mass. Stomach/Bowel: Visualized portions within the abdomen are unremarkable. No disproportionate dilation of bowel loops. Vascular/Lymphatic: No pathologically enlarged lymph nodes identified. No abdominal aortic aneurysm demonstrated. No ascites. Other:  None. Musculoskeletal: No suspicious bone lesions identified. IMPRESSION: 1. Motion degraded exam. 2. Redemonstration of liver surface nodularity, compatible with cirrhosis. No discrete focal liver lesion seen. No ascites or splenomegaly. 3. Redemonstration of multiple peribiliary cysts, which are nonspecific but usually seen in the setting of severe chronic liver disease, portal hypertension, ADPKD, etc. 4. 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 Continued follow-up is recommended. 5. Multiple other nonacute observations, as described above. Electronically Signed  By: Beula Brunswick M.D.   On: 06/29/2023 15:52   MR 3D Recon At Scanner Result Date: 06/29/2023 CLINICAL DATA:  Gallbladder/biliary cancer, monitor; Gallbladder/biliary cancer, monitor [phone] Abnormal CT of the abdomen 220388. EXAM: MRI ABDOMEN WITHOUT AND WITH CONTRAST (INCLUDING MRCP) TECHNIQUE: Multiplanar multisequence MR imaging of the abdomen was performed both before and after the administration of intravenous contrast. Heavily T2-weighted images of the biliary and pancreatic ducts were obtained, and three-dimensional MRCP images were rendered by post processing. CONTRAST:  7mL GADAVIST  GADOBUTROL  1 MMOL/ML IV SOLN COMPARISON:  CT scan abdomen and pelvis from 06/28/2023. FINDINGS: Note examination is mild-to-moderately limited due to patient's motion during data acquisition. Lower chest: Unremarkable MR appearance to the lung bases. No pleural effusion. No pericardial effusion. Normal heart size. Hepatobiliary: Redemonstration of liver  surface nodularity, compatible with cirrhosis. No discrete focal liver lesion seen. Redemonstration of multiple well-circumscribed cystic lesions mainly surrounding the left lobe bile ducts but small amount of cysts are also seen surrounding the right lobe bile ducts as well as at the confluence. The MRCP images are motion degraded but these cystic lesions are likely separate to the adjacent bile ducts, which are not dilated. These are favored to represent peribiliary cysts which usually occurs in the setting of severe chronic liver disease, portal hypertension, ADPKD, etc. There is interval increase in the size and number of cysts when compared to the prior MRI from 04/21/2020. The extrahepatic bile ducts are not dilated. No choledocholithiasis. No obstructing mass seen. Unremarkable gallbladder. Pancreas: Small/atrophic pancreas. Slightly prominent main pancreatic duct. There are 2 cystic lesions in the pancreatic head/uncinate process (measuring 9 x 14 mm) and pancreatic body (measuring 9 x 9 mm), which previously measured approximately 8 x 11 mm and 9 x 10 mm, respectively. The lesions are nonenhancing. 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 Continued follow-up is recommended in 2 years to document stability. Spleen:  Within normal limits in size and appearance. No focal mass. Adrenals/Urinary Tract: Unremarkable adrenal glands. No hydroureteronephrosis. No suspicious renal mass. Stomach/Bowel: Visualized portions within the abdomen are unremarkable. No disproportionate dilation of bowel loops. Vascular/Lymphatic: No pathologically enlarged lymph nodes identified. No abdominal aortic aneurysm demonstrated. No ascites. Other:  None. Musculoskeletal: No suspicious bone lesions identified. IMPRESSION: 1. Motion degraded exam. 2. Redemonstration of liver surface nodularity, compatible  with cirrhosis. No discrete focal liver lesion seen. No ascites or splenomegaly. 3. Redemonstration of multiple peribiliary cysts, which are nonspecific but usually seen in the setting of severe chronic liver disease, portal hypertension, ADPKD, etc. 4. 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 Continued follow-up is recommended. 5. Multiple other nonacute observations, as described above. Electronically Signed   By: Beula Brunswick M.D.   On: 06/29/2023 15:52   CT ABDOMEN PELVIS W CONTRAST Result Date: 06/28/2023 CLINICAL DATA:  Acute nonlocalized abdominal pain EXAM: CT ABDOMEN AND PELVIS WITH CONTRAST TECHNIQUE: Multidetector CT imaging of the abdomen and pelvis was performed using the standard protocol following bolus administration of intravenous contrast. RADIATION DOSE REDUCTION: This exam was performed according to the departmental dose-optimization program which includes automated exposure control, adjustment of the mA and/or kV according to patient size and/or use of iterative reconstruction technique. CONTRAST:  OMNIPAQUE  IOHEXOL  300 MG/ML  SOLN COMPARISON:  04/20/2020 FINDINGS: Lower chest: No acute abnormality. Extensive coronary artery  calcification. Hepatobiliary: Advanced cirrhosis with a relatively shrunken appearance of the no enhancing intrahepatic mass identified. However, there is asymmetric moderate intrahepatic biliary ductal dilation involving the left biliary tree to the level of the confluence of the hepatic ducts with relative decompression of the right and common duct. This appears progressive since prior examination. An extrinsic obstructing lesion is not clearly identified, however, this is not optimally assessed on this examination. Additionally, a biliary stricture or intraluminal mass could result in a similar appearance. The gallbladder is unremarkable. Pancreas: 12 mm  cystic lesion within the uncinate process of the pancreas has enlarged in the interval possibly representing a a dilated pancreatic side branch, intrapancreatic pseudocyst, or a cystic pancreatic neoplasm. No superimposed peripancreatic inflammatory changes or fluid collections identified. Spleen: Normal in size without focal abnormality. Adrenals/Urinary Tract: Adrenal glands are unremarkable. The kidneys are normal in size and position. Mild bilateral nonspecific perinephric edema. The kidneys are otherwise unremarkable. The bladder is distended, suggesting bladder outlet obstruction, and there is mild perivesicular inflammatory stranding which may relate to superimposed infectious or inflammatory cystitis. Stomach/Bowel: Stomach is within normal limits. Appendix appears normal. No evidence of bowel wall thickening, distention, or inflammatory changes. Vascular/Lymphatic: Aortic atherosclerosis. No enlarged abdominal or pelvic lymph nodes. Reproductive: Moderate prostatic hypertrophy Other: No abdominal wall hernia or abnormality. No abdominopelvic ascites. Musculoskeletal: Right hip ORIF and left total hip arthroplasty has been performed. The osseous structures are diffusely osteopenic. Healed right rib fractures are noted. Remote L1 compression deformity with 50% loss of height anteriorly. No acute bone abnormality. IMPRESSION: 1. Advanced hepatic cirrhosis. 2. Progressive asymmetric moderate intrahepatic biliary ductal dilation involving the left biliary tree to the level of the confluence of the hepatic ducts with relative decompression of the right and common duct. An extrinsic obstructing lesion is not clearly identified, however, this is not optimally assessed on this examination. Additionally, a biliary stricture or intraluminal mass such as cholangiocarcinoma could result in a similar appearance. Correlation with liver function tests is recommended. Additionally, dedicated MRI/MRCP examination is  recommended. 3. 12 mm cystic lesion within the uncinate process of the pancreas possibly representing a dilated pancreatic side branch, intrapancreatic pseudocyst, or a cystic pancreatic neoplasm. This could be further assessed with MRI examination once the patient's acute issues have resolved. 4. Bladder distention in keeping with bladder outlet obstruction. Mild perivesicular inflammatory stranding which may relate to superimposed infectious or inflammatory cystitis. Catheter drainage and correlation with urinalysis is recommended. 5. Moderate prostatic hypertrophy. 6. Extensive coronary artery calcification. 7. Remote L1 compression deformity. Aortic Atherosclerosis (ICD10-I70.0). Electronically Signed   By: Worthy Heads M.D.   On: 06/28/2023 19:44   DG Chest Portable 1 View Result Date: 06/28/2023 EXAM: 1 VIEW XRAY OF THE CHEST 06/28/2023 06:50:58 PM COMPARISON: 06/27/2023 CLINICAL HISTORY: FTT. from [facility]. Staff reports patient is not eating. VS with EMS 135/70, 80 HR, 15 RR, 2L [place] (chronic), 131 CBG FINDINGS: LUNGS AND PLEURA: No consolidation. No pulmonary edema. No pleural effusion. No pneumothorax. HEART AND MEDIASTINUM: No acute abnormality of the cardiac and mediastinal silhouettes. BONES AND SOFT TISSUES: No acute osseous abnormality. LINES AND TUBES: Left chest port terminating at the cavoatrial junction. VASCULATURE: Thoracic aortic atherosclerosis. IMPRESSION: 1. No acute findings. Electronically signed by: Zadie Herter MD 06/28/2023 07:19 PM EDT RP Workstation: [id number]   Scheduled Meds:  Chlorhexidine  Gluconate Cloth  6 each Topical Q0600   donepezil   10 mg Oral QHS   enoxaparin  (LOVENOX ) injection  40 mg Subcutaneous Q24H   [  START ON 07/01/2023] levothyroxine   75 mcg Oral Q0600   memantine   5 mg Oral BID   rosuvastatin   10 mg Oral Q2200   tamsulosin  0.4 mg Oral QPC supper   Continuous Infusions:   LOS: 2 days   Colin Dawley M.D on 06/30/2023 at 2:40 PM  Go to  [email or web address] - for contact info  [facility] - Office  [phone]  If 7PM-7AM, please contact night-coverage [email or web address] 06/30/2023, 2:40 PM

## 2023-07-01 DIAGNOSIS — J440 Chronic obstructive pulmonary disease with acute lower respiratory infection: Secondary | ICD-10-CM | POA: Diagnosis not present

## 2023-07-01 DIAGNOSIS — R339 Retention of urine, unspecified: Secondary | ICD-10-CM | POA: Diagnosis not present

## 2023-07-01 DIAGNOSIS — K862 Cyst of pancreas: Secondary | ICD-10-CM | POA: Diagnosis not present

## 2023-07-01 DIAGNOSIS — F0390 Unspecified dementia without behavioral disturbance: Secondary | ICD-10-CM | POA: Diagnosis not present

## 2023-07-01 LAB — BASIC METABOLIC PANEL WITH GFR
Anion gap: 8 (ref 5–15)
BUN: 14 mg/dL (ref 8–23)
CO2: 26 mmol/L (ref 22–32)
Calcium: 7.9 mg/dL — ABNORMAL LOW (ref 8.9–10.3)
Chloride: 100 mmol/L (ref 98–111)
Creatinine, Ser: 0.76 mg/dL (ref 0.61–1.24)
GFR, Estimated: >60 mL/min (ref >60)
Glucose, Bld: 83 mg/dL (ref 70–99)
Potassium: 3.3 mmol/L — ABNORMAL LOW (ref 3.5–5.1)
Sodium: 134 mmol/L — ABNORMAL LOW (ref 135–145)

## 2023-07-01 MED ORDER — POTASSIUM CHLORIDE CRYS ER 20 MEQ PO TBCR
40.0000 meq | EXTENDED_RELEASE_TABLET | ORAL | Status: AC
  Administered 2023-07-01 (×2): 40 meq via ORAL
  Filled 2023-07-01 (×2): qty 2

## 2023-07-01 MED ORDER — SODIUM CHLORIDE 0.9 % IV SOLN: INTRAVENOUS | Status: DC

## 2023-07-01 NOTE — Plan of Care (Signed)

## 2023-07-01 NOTE — Plan of Care (Signed)

## 2023-07-01 NOTE — Progress Notes (Addendum)
 PROGRESS NOTE  Ian Aguilar, is a 83 y.o. male, DOB - Apr 01, 1940, ZOX:[id number]  Admit date - 06/28/2023   Admitting Physician Twilla Galea, DO  Outpatient Primary MD for the patient is [name], MD  LOS - 3  Chief Complaint  Patient presents with   Failure To Thrive      Brief Narrative:  83 y.o. male with medical history significant of COPD, osteoarthritis of hip admitted on 06/28/2023 from [facility] [place] [facility] facility with acute metabolic encephalopathy and generalized weakness with failure to thrive/urinary retention and hematuria   -Assessment and Plan: 1)Acute Metabolic Encephalopathy--??  Etiology - Chest x-ray, CT abdomen and pelvis as well as MRI/MRCP abdomen without evidence of acute infection - UA is not suggestive of UTI - No leukocytosis, no fevers - Patient received IV Rocephin  in the ED -Urine and blood cultures NGTD - Okay to hold off on further antibiotics at this time 07/01/23 - Mentation continues to improve  2)AbNormal Abd Imaging--- CT abdomen and pelvis and MRCP shows liver surface nodularity, compatible with cirrhosis. No discrete focal liver lesion seen. No ascites or Splenomegaly, as well as multiple peribiliary cysts, which are nonspecific but usually seen in the setting of severe chronic liver disease, portal hypertension, ADPKD, etc. 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  - Repeat imaging as outpatient if desired by patient and family  3)Generalized weakness and deconditioning/Falls-- per patient's daughter, and staff at [facility]--until recently patient was able to feed himself, toilet himself and perform most of his ADLs independently. -Until Recently patient was able to ambulate with a walker for short distances, he independently propelled himself in a wheelchair when he had to go longer distances. -- Currently he is very  weak, deconditioned, with significant ambulatory dysfunction and recently had a fall at his ALF facility. -- Patient will benefit from dedicated rehab sessions to help him regain his mobility, endurance and strength. It is felt that patient needs rehab services to restore this patient to their prior level of function to achieve safe transition back to home care. This patient needs rehab services for at least 5 days per week and skilled nursing services daily to facilitate this transition. Rehab is being requested as the most appropriate d/c option for this patient and is NOT felt to be for custodial care as evidenced by previously independent with ADLs including not limited to feeding, toileting, ambulation/independent transfers prior to admission. --Patient has significant limitations with mobility related ADLs- this patient needs to continue to be monitored in the hospital until a SNF bed is obtained as she is not safe to go home with her current physcical limitations - - Patient's ALF facility will come and evaluate him on 07/02/2023 to see if they can take him back with home health services and provide level of care needed at his current ALF facility if they are unable to accept him back to ALF patient will probably need to go to SNF rehab as recommended by physical therapist  4)Hypothyroidism---TSH was low at 0.021 on 06/27/2023 , free T4 was elevated at 1.99, so decrease levothyroxine  to 75 mcg from 100 mcg daily  - Repeat TSH in 4 to 6 weeks  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 Barriers: Not Clinically Stable-   Code Status : -  Code Status: Do not attempt resuscitation (DNR) PRE-ARREST INTERVENTIONS DESIRED   Family Communication:   Discussed with with Daughter Ian Aguilar -I called and updated patient's daughter on 06/29/2023 and again on 06/30/2023 DVT Prophylaxis  :   - SCDs   enoxaparin  (LOVENOX ) injection 40 mg Start: 06/29/23 1000 SCDs Start: 06/29/23 0043  Lab Results  Component Value Date   PLT 229 06/29/2023   Inpatient Medications  Scheduled Meds:  Chlorhexidine  Gluconate Cloth  6 each Topical Q0600   donepezil   10 mg Oral QHS   enoxaparin  (LOVENOX ) injection  40 mg Subcutaneous Q24H   feeding supplement  237 mL Oral BID BM   levothyroxine   75 mcg Oral Q0600   memantine   5 mg Oral BID   potassium chloride  40 mEq Oral Q3H   rosuvastatin   10 mg Oral Q2200   tamsulosin  0.4 mg Oral QPC supper   Continuous Infusions: PRN Meds:.albuterol , LORazepam , ondansetron  **OR** ondansetron  (ZOFRAN ) IV   Anti-infectives (From admission, onward)    Start     Dose/Rate Route Frequency Ordered Stop   06/29/23 1415  cefTRIAXone  (ROCEPHIN ) 2 g in sodium chloride  0.9 % 100 mL IVPB  Status:  Discontinued        2 g 200 mL/hr over 30 Minutes Intravenous Every 24 hours 06/29/23 1319 06/29/23 1658      Subjective: Ian Aguilar today has no fevers, no emesis,  No chest pain,    - Fatigue and generalized weakness persist - Hematuria improving - Patient's ALF facility will come and evaluate him on 07/02/2023 to  see if they can take him back with home health services and provide level of care needed at his current ALF facility if they are unable to accept him back to ALF patient will probably need to go to SNF rehab as recommended by physical therapist  Objective: Vitals:   07/01/23 1000 07/01/23 1012 07/01/23 1116 07/01/23 1200  BP:  (!) 163/68  132/63  Pulse:  61  84  Resp: 17   18  Temp:   97.9 F (36.6 C)   TempSrc:   Oral   SpO2: 98% 98% 90%   Weight:      Height:        Intake/Output Summary (Last 24 hours) at 07/01/2023 1548 Last data filed at 07/01/2023 1013 Gross per 24 hour  Intake 1144.84 ml  Output 715 ml  Net 429.84 ml   Filed Weights   06/29/23 1006  Weight: 70.2 kg    Physical Exam Gen:- Awake ,Alert, more coherent and cooperative HEENT:- [place].AT, No sclera icterus Neck-Supple Neck,No JVD,.  Lungs-  CTAB , fair symmetrical air movement CV- S1, S2 normal, regular ,  Lt sided Porth Cath in situ Abd-  +ve B.Sounds, Abd Soft, No tenderness,    Extremity/Skin:- No  edema, pedal pulses present  Psych-cognitive and memory deficits consistent with underlying dementia , no further significant agitation or restlessness  Neuro-Generalized Weakness , No new focal deficits, no tremors GU---Foley with improving Hematuria  Data Reviewed: I have personally reviewed following labs and imaging studies  CBC: Recent Labs  Lab 06/27/23 1628 06/28/23 1843 06/29/23 0348  WBC 7.6 10.1 7.7  NEUTROABS 6.6 9.0*  --   HGB 13.6 14.4 12.2*  HCT 41.3 42.5 36.7*  MCV 94.9 93.4 92.7  PLT 245 244 229   Basic Metabolic Panel: Recent Labs  Lab 06/27/23 1628 06/28/23 1843 06/29/23 0348 07/01/23 0430  NA 137 133* 135 134*  K 3.8 4.0 3.6 3.3*  CL 98 96* 97* 100  CO2 28 25 29 26   GLUCOSE 101* 101* 87 83  BUN 19 17 15 14   CREATININE 0.93 0.86 0.82 0.76  CALCIUM  9.3 9.1 8.7* 7.9*  MG 1.9 1.9 1.6*  --   PHOS  --   --  3.2  --    GFR: Estimated Creatinine Clearance: 70.7 mL/min (by C-G  formula based on SCr of 0.76 mg/dL). Liver Function Tests: Recent Labs  Lab 06/27/23 1628 06/28/23 1843 06/29/23 0348  AST 18 24 19   ALT 11 13 12   ALKPHOS 75 74 60  BILITOT 1.1 1.6* 1.2  PROT 6.9 6.8 5.7*  ALBUMIN 3.8 3.6 3.1*    Radiology Studies: No results found.  Scheduled Meds:  Chlorhexidine  Gluconate Cloth  6 each Topical Q0600   donepezil   10 mg Oral QHS   enoxaparin  (LOVENOX ) injection  40 mg Subcutaneous Q24H   feeding supplement  237 mL Oral BID BM   levothyroxine   75 mcg Oral Q0600   memantine   5 mg Oral BID   potassium chloride  40 mEq Oral Q3H   rosuvastatin   10 mg Oral Q2200   tamsulosin  0.4 mg Oral QPC supper   Continuous Infusions:   LOS: 3 days   Colin Dawley M.D on 07/01/2023 at 3:48 PM  Go to [email or web address] - for contact info  [facility] - Office  [phone]  If 7PM-7AM, please contact night-coverage [email or web address] 07/01/2023, 3:48 PM

## 2023-07-02 ENCOUNTER — Encounter (HOSPITAL_COMMUNITY): Payer: Self-pay | Admitting: Internal Medicine

## 2023-07-02 DIAGNOSIS — Z7189 Other specified counseling: Secondary | ICD-10-CM

## 2023-07-02 DIAGNOSIS — R4182 Altered mental status, unspecified: Secondary | ICD-10-CM | POA: Diagnosis not present

## 2023-07-02 DIAGNOSIS — R627 Adult failure to thrive: Secondary | ICD-10-CM | POA: Diagnosis not present

## 2023-07-02 DIAGNOSIS — R531 Weakness: Secondary | ICD-10-CM | POA: Diagnosis not present

## 2023-07-02 DIAGNOSIS — R339 Retention of urine, unspecified: Secondary | ICD-10-CM | POA: Diagnosis not present

## 2023-07-02 DIAGNOSIS — Z515 Encounter for palliative care: Secondary | ICD-10-CM

## 2023-07-02 MED ORDER — HYDRALAZINE HCL 20 MG/ML IJ SOLN
10.0000 mg | Freq: Four times a day (QID) | INTRAMUSCULAR | Status: DC | PRN
  Administered 2023-07-02: 10 mg via INTRAVENOUS
  Filled 2023-07-02: qty 1

## 2023-07-02 MED ORDER — CARVEDILOL 12.5 MG PO TABS
12.5000 mg | ORAL_TABLET | Freq: Two times a day (BID) | ORAL | Status: DC
  Administered 2023-07-02 – 2023-07-03 (×2): 12.5 mg via ORAL
  Filled 2023-07-02 (×2): qty 1

## 2023-07-02 NOTE — Progress Notes (Signed)
 Briefly stood patient at bedside. It took a lot of persuasion getting him to put effort in. He was initially able to stand with minimal-moderate help. Quickly he gave out and even lifted up his feet while I was holding him. We sat him back on the bed and he was able adjust himself into the bed with some help. Pt is very drowsy, but easily arousable. No adverse events from the mobilization.

## 2023-07-02 NOTE — Progress Notes (Signed)
 Physical Therapy Treatment Patient Details Name: Ian Aguilar MRN: [id number] DOB: 13-Jul-1940 Today's Date: 07/02/2023   History of Present Illness Ian Aguilar is a 83 y.o. male with medical history significant of COPD, osteoarthritis of hip who presents to the emergency department via EMS from [facility] facility.  Patient was unable to provide any history possibly due to altered mental status or possible underlying dementia, history was obtained from ED physician and ED medical record.  Per report, patient patient has not been eating.  Son reported that patient was coherent 2 days ago (Tuesday 4/29), but is now altered.    He presented to the emergency department yesterday due to generalized weakness that was ongoing for about 2 weeks PTA.  He was previously able to assist with transfers, however, he was no longer able to stand on his own without assist and has also been reaching at things in the air that are not there, thereby raising concern for hallucinations.  Thyroid  levels checked about 2 weeks ago was noted to be abnormal, it was reported that he underwent a change in his Synthroid  dosing recently.    PT Comments  Patient limited to sitting up at bedside mostly due to lethargy, declined to attempt sit to stands or transferring to chair due to c/o fatigue and required Max assist to reposition when put back to bed. Patient will benefit from continued skilled physical therapy in hospital and recommended venue below to increase strength, balance, endurance for safe ADLs and gait.     If plan is discharge home, recommend the following: A lot of help with bathing/dressing/bathroom;A lot of help with walking and/or transfers;Help with stairs or ramp for entrance;Assistance with cooking/housework   Can travel by private vehicle     No  Equipment Recommendations  None recommended by PT    Recommendations for Other Services       Precautions / Restrictions Precautions Precautions:  Fall Restrictions Weight Bearing Restrictions Per Provider Order: No     Mobility  Bed Mobility Overal bed mobility: Needs Assistance Bed Mobility: Supine to Sit, Sit to Supine     Supine to sit: Mod assist Sit to supine: Mod assist   General bed mobility comments: increased time, labored movement    Transfers                        Ambulation/Gait                   Stairs             Wheelchair Mobility     Tilt Bed    Modified Rankin (Stroke Patients Only)       Balance Overall balance assessment: Needs assistance Sitting-balance support: Feet supported, No upper extremity supported Sitting balance-Leahy Scale: Fair Sitting balance - Comments: seated at EOB                                    Communication Communication Communication: No apparent difficulties  Cognition Arousal: Lethargic Behavior During Therapy: Anxious   PT - Cognitive impairments: Memory                         Following commands: Impaired Following commands impaired: Follows one step commands inconsistently    Cueing Cueing Techniques: Verbal cues, Tactile cues  Exercises      General Comments  Pertinent Vitals/Pain Pain Assessment Pain Assessment: No/denies pain    Home Living                          Prior Function            PT Goals (current goals can now be found in the care plan section) Acute Rehab PT Goals Patient Stated Goal: return home PT Goal Formulation: With patient Time For Goal Achievement: 07/13/23 Potential to Achieve Goals: Good Progress towards PT goals: Progressing toward goals    Frequency    Min 3X/week      PT Plan      Co-evaluation              AM-PAC PT "6 Clicks" Mobility   Outcome Measure  Help needed turning from your back to your side while in a flat bed without using bedrails?: A Lot Help needed moving from lying on your back to sitting on the side  of a flat bed without using bedrails?: A Lot Help needed moving to and from a bed to a chair (including a wheelchair)?: A Lot Help needed standing up from a chair using your arms (e.g., wheelchair or bedside chair)?: A Lot Help needed to walk in hospital room?: A Lot Help needed climbing 3-5 steps with a railing? : Total 6 Click Score: 11    End of Session   Activity Tolerance: Patient limited by fatigue;Patient limited by lethargy Patient left: in bed;with call bell/phone within reach;with bed alarm set Nurse Communication: Mobility status PT Visit Diagnosis: Unsteadiness on feet (R26.81);Other abnormalities of gait and mobility (R26.89);Muscle weakness (generalized) (M62.81)     Time: 1337-1400 PT Time Calculation (min) (ACUTE ONLY): 23 min  Charges:    $Therapeutic Activity: 23-37 mins PT General Charges $$ ACUTE PT VISIT: 1 Visit                     4:02 PM, 07/02/23 Walton Guppy, MPT Physical Therapist with [facility] 336 [phone] office [phone] mobile phone

## 2023-07-02 NOTE — Progress Notes (Signed)
 PROGRESS NOTE  Ian Aguilar, is a 83 y.o. male, DOB - [birth year], ZOX:[id number]  Admit date - 06/28/2023   Admitting Physician Twilla Galea, DO  Outpatient Primary MD for the patient is [name], MD  LOS - 4  Chief Complaint  Patient presents with   Failure To Thrive      Brief Narrative:  83 y.o. male with medical history significant of COPD, osteoarthritis of hip admitted on 06/28/2023 from [facility] [place] [facility] facility with acute metabolic encephalopathy and generalized weakness with failure to thrive/urinary retention and hematuria   -Assessment and Plan: 1)Acute Metabolic Encephalopathy--??  Etiology - Chest x-ray, CT abdomen and pelvis as well as MRI/MRCP abdomen without evidence of acute infection - UA is not suggestive of UTI - No leukocytosis, no fevers - Patient received IV Rocephin  in the ED -Urine and blood cultures NGTD - Okay to hold off on further antibiotics at this time 07/02/23 - Mentation continues to improve, somewhat tired/sleepy today compared to 07/01/2023  2)AbNormal Abd Imaging--- CT abdomen and pelvis and MRCP shows liver surface nodularity, compatible with cirrhosis. No discrete focal liver lesion seen. No ascites or Splenomegaly, as well as multiple peribiliary cysts, which are nonspecific but usually seen in the setting of severe chronic liver disease, portal hypertension, ADPKD, etc. 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  - Repeat imaging as outpatient if desired by patient and family  3)Generalized weakness and deconditioning/Falls-- per patient's daughter, and staff at [facility]--until recently patient was able to feed himself, toilet himself and perform most of his ADLs independently. -Until Recently patient was able to ambulate with a walker for short distances, he independently propelled himself in a wheelchair when he had to  go longer distances. -- Currently he is very weak, deconditioned, with significant ambulatory dysfunction and recently had a fall at his ALF facility. -- Patient will benefit from dedicated rehab sessions to help him regain his mobility, endurance and strength. It is felt that patient needs rehab services to restore this patient to their prior level of function to achieve safe transition back to home care. This patient needs rehab services for at least 5 days per week and skilled nursing services daily to facilitate this transition. Rehab is being requested as the most appropriate d/c option for this patient and is NOT felt to be for custodial care as evidenced by previously independent with ADLs including not limited to feeding, toileting, ambulation/independent transfers prior to admission. --Patient has significant limitations with mobility related ADLs- this patient needs to continue to be monitored in the hospital until a SNF bed is obtained as she is not safe to go home with her current physcical limitations - -Physical therapy evaluated recommending SNF rehab - Patient's ALF facility evaluated him on 07/02/2023, and state that are unable to  provide current level of care needed.  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  Claretta Croft - Please see urology consult note from 06/29/2023 - Hematuria mostly resolved  10)Dementia--- with behavioral disturbance, continue Aricept  and Namenda  = Behavioral modification and reorientation  11) social/ethics--palliative care consult appreciated patient is a DNR/DNI  Status is: Inpatient   Disposition: The patient is from: ALF              Anticipated d/c is to: SNF Vs ALF with HH              Anticipated d/c date is: 1 day              Patient currently is not medically stable to d/c. Barriers: Not Clinically Stable-   Code Status : -  Code Status: Limited: Do not attempt resuscitation (DNR) -DNR-LIMITED -Do Not Intubate/DNI    Family Communication:   Discussed with with Daughter Mrs Alexio Sharpsteen -Yates -I called and updated patient's daughter on 06/29/2023 and again on 06/30/2023 DVT Prophylaxis  :   - SCDs   enoxaparin  (LOVENOX ) injection 40 mg Start: 06/29/23 1000 SCDs Start: 06/29/23 0043  Lab Results  Component Value Date   PLT 229 06/29/2023   Inpatient Medications  Scheduled Meds:  carvedilol  12.5 mg Oral BID WC   Chlorhexidine  Gluconate Cloth  6 each Topical Q0600   donepezil   10 mg Oral QHS   enoxaparin  (LOVENOX ) injection  40 mg Subcutaneous Q24H   feeding supplement  237 mL Oral BID BM   levothyroxine   75 mcg Oral Q0600   memantine   5 mg Oral BID   rosuvastatin   10 mg Oral Q2200   tamsulosin  0.4 mg Oral QPC supper   Continuous Infusions: PRN Meds:.albuterol , hydrALAZINE , LORazepam , ondansetron  **OR** ondansetron  (ZOFRAN ) IV   Anti-infectives (From admission, onward)    Start     Dose/Rate Route Frequency Ordered Stop   06/29/23 1415  cefTRIAXone  (ROCEPHIN ) 2 g in sodium chloride  0.9 % 100 mL IVPB  Status:  Discontinued        2 g 200 mL/hr over 30 Minutes Intravenous Every 24 hours 06/29/23 1319 06/29/23 1658      Subjective: [name] today has no fevers, no emesis,  No chest pain,    somewhat tired/sleepy today compared to  07/01/2023 - Not eating as well today - having more  Difficulty with ambulation today  Objective: Vitals:   07/02/23 1300 07/02/23 1400 07/02/23 1637 07/02/23 1708  BP:      Pulse:    (!) 51  Resp: 16 19    Temp:   97.6 F (36.4 C)   TempSrc:   Oral   SpO2:      Weight:      Height:        Intake/Output Summary (Last 24 hours) at 07/02/2023 1835 Last data filed at 07/02/2023 1246 Gross per 24 hour  Intake 480 ml  Output 600 ml  Net -120 ml   Filed Weights   06/29/23 1006  Weight: 70.2 kg    Physical Exam Gen:- Awake ,Alert, more coherent and cooperative HEENT:- [place].AT, No sclera icterus Neck-Supple Neck,No JVD,.  Lungs-  CTAB , fair symmetrical air movement CV- S1, S2 normal, regular , Lt sided Porth Cath in situ Abd-  +ve B.Sounds, Abd Soft, No tenderness,    Extremity/Skin:- No  edema, pedal pulses present  Psych-cognitive and memory deficits consistent with underlying dementia , no further significant agitation or restlessness  Neuro-Generalized Weakness , No  new focal deficits, no tremors GU---Foley hematuria has mostly resolved  Data Reviewed: I have personally reviewed following labs and imaging studies  CBC: Recent Labs  Lab 06/27/23 1628 06/28/23 1843 06/29/23 0348  WBC 7.6 10.1 7.7  NEUTROABS 6.6 9.0*  --   HGB 13.6 14.4 12.2*  HCT 41.3 42.5 36.7*  MCV 94.9 93.4 92.7  PLT 245 244 229   Basic Metabolic Panel: Recent Labs  Lab 06/27/23 1628 06/28/23 1843 06/29/23 0348 07/01/23 0430  NA 137 133* 135 134*  K 3.8 4.0 3.6 3.3*  CL 98 96* 97* 100  CO2 28 25 29 26   GLUCOSE 101* 101* 87 83  BUN 19 17 15 14   CREATININE 0.93 0.86 0.82 0.76  CALCIUM  9.3 9.1 8.7* 7.9*  MG 1.9 1.9 1.6*  --   PHOS  --   --  3.2  --    GFR: Estimated Creatinine Clearance: 70.7 mL/min (by C-G formula based on SCr of 0.76 mg/dL). Liver Function Tests: Recent Labs  Lab 06/27/23 1628 06/28/23 1843 06/29/23 0348  AST 18 24 19   ALT 11 13 12   ALKPHOS 75 74 60  BILITOT  1.1 1.6* 1.2  PROT 6.9 6.8 5.7*  ALBUMIN 3.8 3.6 3.1*    Radiology Studies: No results found.  Scheduled Meds:  carvedilol  12.5 mg Oral BID WC   Chlorhexidine  Gluconate Cloth  6 each Topical Q0600   donepezil   10 mg Oral QHS   enoxaparin  (LOVENOX ) injection  40 mg Subcutaneous Q24H   feeding supplement  237 mL Oral BID BM   levothyroxine   75 mcg Oral Q0600   memantine   5 mg Oral BID   rosuvastatin   10 mg Oral Q2200   tamsulosin  0.4 mg Oral QPC supper   Continuous Infusions:   LOS: 4 days   Colin Dawley M.D on 07/02/2023 at 6:35 PM  Go to [email or web address] - for contact info  [facility] - Office  [phone]  If 7PM-7AM, please contact night-coverage [email or web address] 07/02/2023, 6:35 PM

## 2023-07-02 NOTE — Consult Note (Signed)
 Consultation Note Date: 07/02/2023   Patient Name: Ian Aguilar  DOB: 01-22-1941  MRN: [id number]  Age / Sex: 83 y.o., male  PCP: [name], MD Referring Physician: Colin Dawley, MD  Reason for Consultation: Establishing goals of care  HPI/Patient Profile: 83 y.o. male  with past medical history of per chart, dementia continue Aricept  and Namenda , COPD, osteoarthritis of hip, history of lung cancer with chemotherapy, thyroid , admitted on 06/28/2023 with acute metabolic encephalopathy etiology unclear, generalized weakness and deconditioning, falls.   Clinical Assessment and Goals of Care: Face-to-face conference with transition of care team and bedside nursing staff related to patient condition, needs, goals of care.  I have reviewed medical records including EPIC notes, labs and imaging, received report from RN, assessed the patient.  Mr. Buckert is lying quietly in bed.  He appears acutely/chronically ill and somewhat frail.  He greets me, making and somewhat keeping eye contact.  He is alert, oriented to self only.  I do believe that he can make his basic needs known if given enough time.  There is no family at bedside at this time.  I offer Mr. Flippen something to eat.  He is able to take and hold a cup of ice cream, but often is unable to use the spoon to get the food to his mouth.  Call to son, Stefanos Limbrick, to discuss diagnosis prognosis, GOC, EOL wishes, disposition and options. I introduced Palliative Medicine as specialized medical care for people living with serious illness. It focuses on providing relief from the symptoms and stress of a serious illness. The goal is to improve quality of life for both the patient and the family.  We discussed a brief life review of the patient.  Josiah Nigh shares that his father has been at [employer] for about 3 years.  Both Josiah Nigh and [place] share healthcare power of  attorney.  We then focused on their current illness.  We talked about thyroid  levels, altered mental status, memory loss.  We talked about time for outcomes.  The natural disease trajectory and expectations at EOL were discussed.  Advanced directives, concepts specific to code status, artifical feeding and hydration, and rehospitalization were considered and discussed.  DNR/DNI verified.  Orders adjusted.  Discussed the importance of continued conversation with family and the medical providers regarding overall plan of care and treatment options, ensuring decisions are within the context of the patient's values and GOCs. Questions and concerns were addressed.  Hard Choices booklet left for review. The family was encouraged to call with questions or concerns.  PMT will continue to support holistically.  Conference with attending, bedside nursing staff, transition of care team related to patient condition, needs, goals of care, disposition.    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  Symptom Management:  Per hospitalist, no additional needs at this time.  Palliative Prophylaxis:  Frequent Pain Assessment and Oral Care  Additional Recommendations (Limitations, Scope, Preferences): Continue to treat but no CPR  Psycho-social/Spiritual:  Desire for further Chaplaincy support:no Additional Recommendations: Caregiving  Support/Resources and Education on Hospice  Prognosis:  Unable to determine, based on outcomes.  Guarded with 2 hospital stays and 3 ED visits in the last 6 months.  6 months or less would not be surprising.  Discharge Planning: To Be Determined       Primary Diagnoses: Present on Admission:  Altered mental status  Failure to thrive in adult  Pancreatic cyst  Urinary retention  COPD (chronic obstructive pulmonary disease)  Dementia without behavioral disturbance (HCC)  Acquired hypothyroidism  Mixed hyperlipidemia   I have reviewed the medical record, interviewed the patient and family, and examined the patient. The following aspects are pertinent.  Past Medical History:  Diagnosis Date   Anxiety    Arthritis    Cancer (HCC)    Cirrhosis (HCC)    confirmed by MRI on 07/04/11.     COPD (chronic obstructive pulmonary disease) (HCC)    Depression with anxiety    ETOH abuse    Hyperlipidemia    Hypertension    diet control   Nodule of upper lobe of right lung    with mediastinal adenopathy   Wears dentures    Wears glasses    Social History   Socioeconomic History   Marital status: Widowed    Spouse name: Not on file   Number of children: 2   Years of education: 48   Highest education level: Not on file  Occupational History   Occupation: retired    Associate Professor: [employer]  Tobacco Use   Smoking status: Former    Current packs/day: 0.00    Types: Cigarettes    Quit date: 02/27/1957    Years since quitting: 66.3   Smokeless tobacco: Never   Tobacco comments:    1/2 ppd > 60 years as of 06/27/17: 4-5 cigarettes or less a day  Vaping Use   Vaping status: Never Used  Substance and Sexual Activity   Alcohol use: Not Currently    Alcohol/week: 2.0 standard drinks of alcohol    Types: 2 Cans of beer per week   Drug use: No   Sexual activity: Yes    Birth control/protection: None  Other Topics Concern   Not on file  Social History Narrative   Right handed   Drinks caffeine   Assistant living   Retired   Has one son and daughter    Social Drivers of Corporate investment banker Strain: Not on file  Food Insecurity: Patient Unable To Answer (06/29/2023)   Hunger Vital Sign    Worried About Running  Out of Food in the Last Year: Patient unable to answer    Ran Out of Food in the Last Year: Patient unable to answer  Transportation Needs: Patient Unable To Answer (06/29/2023)   PRAPARE - Transportation    Lack of Transportation (Medical): Patient unable to answer    Lack of Transportation (Non-Medical): Patient unable to answer  Physical Activity: Not on file  Stress: Not on file  Social Connections: Patient Unable To Answer (06/29/2023)   Social Connection and Isolation Panel [NHANES]    Frequency of Communication with Friends and Family: Patient  unable to answer    Frequency of Social Gatherings with Friends and Family: Patient unable to answer    Attends Religious Services: Patient unable to answer    Active Member of Clubs or Organizations: Patient unable to answer    Attends Banker Meetings: Patient unable to answer    Marital Status: Patient unable to answer  Recent Concern: Social Connections - Socially Isolated (05/08/2023)   Social Connection and Isolation Panel [NHANES]    Frequency of Communication with Friends and Family: Twice a week    Frequency of Social Gatherings with Friends and Family: Twice a week    Attends Religious Services: Never    Database administrator or Organizations: No    Attends Banker Meetings: Never    Marital Status: Widowed   Family History  Problem Relation Age of Onset   Stroke Mother 96   Heart disease Father 71       MI   Diabetes Maternal Uncle    Colon cancer Neg Hx    Cancer Neg Hx    Scheduled Meds:  carvedilol  12.5 mg Oral BID WC   Chlorhexidine  Gluconate Cloth  6 each Topical Q0600   donepezil   10 mg Oral QHS   enoxaparin  (LOVENOX ) injection  40 mg Subcutaneous Q24H   feeding supplement  237 mL Oral BID BM   levothyroxine   75 mcg Oral Q0600   memantine   5 mg Oral BID   rosuvastatin   10 mg Oral Q2200   tamsulosin  0.4 mg Oral QPC supper   Continuous Infusions: PRN Meds:.albuterol , hydrALAZINE ,  LORazepam , ondansetron  **OR** ondansetron  (ZOFRAN ) IV Medications Prior to Admission:  Prior to Admission medications   Medication Sig Start Date End Date Taking? Authorizing Provider  acetaminophen  (TYLENOL ) 325 MG tablet Take 325 mg by mouth every 4 (four) hours as needed for mild pain (pain score 1-3). 12/28/22  Yes [provider]  albuterol  (VENTOLIN  HFA) 108 (90 Base) MCG/ACT inhaler Inhale 1 puff into the lungs 4 (four) times daily as needed. Patient taking differently: Inhale 1 puff into the lungs 4 (four) times daily as needed for shortness of breath or wheezing. 05/09/23  Yes Bumgardner, Clanford L, MD  Aloe Vera GEL Apply 1 Application topically in the morning, at noon, and at bedtime. Apply to nasal carriage   Yes [provider]  benzonatate  (TESSALON ) 200 MG capsule Take 200 mg by mouth 3 (three) times daily as needed for cough. 08/17/22  Yes [provider]  cholecalciferol  (VITAMIN D ) 1000 units tablet Take 1,000 Units by mouth daily.   Yes [provider]  cycloSPORINE  (RESTASIS ) 0.05 % ophthalmic emulsion Place 1 drop into both eyes 2 (two) times daily. 08/18/22  Yes [provider]  desonide (DESOWEN) 0.05 % cream Apply 1 Application topically 2 (two) times daily as needed (itching). Apply to face, scalp, and ears 01/16/23  Yes [provider]  diclofenac  Sodium (VOLTAREN ) 1 % GEL Apply 2 g topically 4 (four) times daily as needed (pain). Apply to left lateral ankle   Yes [provider]  docusate sodium  (COLACE) 100 MG capsule Take 100 mg by mouth 2 (two) times daily.   Yes [provider]  donepezil  (ARICEPT ) 10 MG tablet TAKE 1 TABLET BY MOUTH AT BEDTIME. 02/15/23  Yes Wertman, Sara E, PA-C  FEROSUL 325 (65 Fe) MG tablet Take 325 mg by mouth daily with breakfast. 06/05/23  Yes [provider]  GOODSENSE ARTIFICIAL TEARS 0.5-0.6 %  SOLN Place 2 drops into both eyes every 8 (eight) hours as needed (dry eyes).  05/24/20  Yes [provider]  ipratropium-albuterol  (DUONEB) 0.5-2.5 (3) MG/3ML SOLN Take 3 mLs by nebulization every 4 (four) hours as needed (wheezing, shortness of breath). 05/09/23  Yes Huizinga, Clanford L, MD  Lactobacillus-Inulin (CULTURELLE DIGESTIVE DAILY PO) Take 1 capsule by mouth daily.   Yes [provider]  levothyroxine  (SYNTHROID ) 100 MCG tablet Take 100 mcg by mouth daily. 06/25/23  Yes [provider]  loperamide (IMODIUM) 2 MG capsule Take 4 mg by mouth as needed for diarrhea or loose stools. Take 2 capsules after 1st loose stool, followed by 1 capsules after each subsequent loose stool. Do not exceed over 16 mg in a 24 hour period.   Yes [provider]  melatonin 5 MG TABS Take 5 mg by mouth at bedtime.   Yes [provider]  memantine  (NAMENDA ) 5 MG tablet Take 1 tablet (5 mg at night) for 2 weeks, then increase to 1 tablet (5 mg) twice a day Patient taking differently: Take 5 mg by mouth 2 (two) times daily. 02/15/23  Yes Wertman, Sara E, PA-C  MUCUS RELIEF 600 MG 12 hr tablet Take 600 mg by mouth 2 (two) times daily as needed for cough. 05/25/23  Yes [provider]  OXYGEN  Inhale 2 L into the lungs continuous. 04/24/20  Yes [provider]  PARoxetine (PAXIL) 20 MG tablet Take 20 mg by mouth daily. 06/07/23  Yes [provider]  Psyllium (METAMUCIL PO) Take 1 capsule by mouth in the morning and at bedtime.   Yes [provider]  rosuvastatin  (CRESTOR ) 10 MG tablet Take 1 tablet (10 mg total) by mouth daily. 05/21/20  Yes Marilyne Shu, NP  TRELEGY ELLIPTA 200-62.5-25 MCG/ACT AEPB Inhale 1 puff into the lungs daily. Rinse mouth after use 09/15/22  Yes [provider]  vitamin B-12 (CYANOCOBALAMIN ) 1000 MCG tablet Take 1 tablet (1,000 mcg total) by mouth daily. 02/10/21  Yes Paulett Boros, MD  pantoprazole  (PROTONIX ) 40 MG tablet Take 1 tablet (40 mg total) by mouth daily for 14  days. Patient not taking: Reported on 06/28/2023 05/10/23 05/24/23  Rayfield Cairo, MD   No Known Allergies Review of Systems  Unable to perform ROS: Age    Physical Exam Vitals and nursing note reviewed.  Constitutional:      General: He is not in acute distress.    Appearance: He is ill-appearing.  HENT:     Mouth/Throat:     Mouth: Mucous membranes are moist.  Cardiovascular:     Rate and Rhythm: Normal rate.  Pulmonary:     Effort: Pulmonary effort is normal. No respiratory distress.  Neurological:     Mental Status: He is alert.     Comments: Oriented to self only  Psychiatric:        Mood and Affect: Mood normal.        Behavior: Behavior normal.     Vital Signs: BP (!) 166/63   Pulse 68   Temp 97.6 F (36.4 C) (Axillary)   Resp 17   Ht 5\' 9"  (1.753 m)   Wt 70.2 kg   SpO2 95%   BMI 22.85 kg/m  Pain Scale: 0-10   Pain Score: 0-No pain   SpO2: SpO2: 95 % O2 Device:SpO2: 95 % O2 Flow Rate: .O2 Flow Rate (L/min): 2 L/min  IO: Intake/output summary:  Intake/Output Summary (Last 24 hours) at 07/02/2023 1330 Last  data filed at 07/02/2023 1246 Gross per 24 hour  Intake 720 ml  Output 1500 ml  Net -780 ml    LBM: Last BM Date : 06/30/23 Baseline Weight: Weight: 70.2 kg Most recent weight: Weight: 70.2 kg     Palliative Assessment/Data:     Time In: 1045 Time Out: 1200 Time Total: 75 minutes Greater than 50%  of this time was spent counseling and coordinating care related to the above assessment and plan.  Signed by: Annabelle Barrack, NP   Please contact Palliative Medicine Team phone at [phone] for questions and concerns.  For individual provider: See Tilford Foley

## 2023-07-02 NOTE — TOC Progression Note (Signed)
 Transition of Care [facility]) - Progression Note    Patient Details  Name: Ian Aguilar MRN: [id number] Date of Birth: [birth year]  Transition of Care [facility]) CM/SW Contact  [place]ville Lax, [place] Phone Number: 07/02/2023, 3:04 PM  Clinical Narrative:    CSW updated that PT is recommending SNF at this time. CSW updated that Tammy with the ALF assess pt while working with PT and they do not feel they can manage pts level of care. CSW spoke to pts son about this and explained SNF referral, he is agreeable and prefers [facility] CSW to send out referral and start insurance auth. TOC to follow.   Expected Discharge Plan: Assisted Living Barriers to Discharge: Continued Medical Work up  Expected Discharge Plan and Services In-house Referral: Clinical Social Work Discharge Planning Services: CM Consult   Living arrangements for the past 2 months: Assisted Living Facility                                       Social Determinants of Health (SDOH) Interventions SDOH Screenings   Food Insecurity: Patient Unable To Answer (06/29/2023)  Housing: Low Risk  (06/29/2023)  Transportation Needs: Patient Unable To Answer (06/29/2023)  Utilities: Patient Unable To Answer (06/29/2023)  Social Connections: Patient Unable To Answer (06/29/2023)  Recent Concern: Social Connections - Socially Isolated (05/08/2023)  Tobacco Use: Medium Risk (07/02/2023)    Readmission Risk Interventions    06/29/2023   11:09 AM 05/09/2023    9:20 AM  Readmission Risk Prevention Plan  Transportation Screening Complete Complete  HRI or Home Care Consult Complete Complete  Social Work Consult for Recovery Care Planning/Counseling Complete Complete  Palliative Care Screening Not Applicable Not Applicable  Medication Review Oceanographer) Complete Complete

## 2023-07-02 NOTE — Plan of Care (Signed)

## 2023-07-02 NOTE — NC FL2 (Signed)
 [place]  MEDICAID FL2 LEVEL OF CARE FORM     IDENTIFICATION  Patient Name: Ian Aguilar Birthdate: 01-15-1941 Sex: male Admission Date (Current Location): 06/28/2023  [facility] and [place] Number:  [employer] and Address:  [facility],  [address], [place] [address]      Provider Number: [id number]  Attending Physician Name and Address:  Colin Dawley, MD  Relative Name and Phone Number:       Current Level of Care: Hospital Recommended Level of Care: Skilled Nursing Facility Prior Approval Number:    Date Approved/Denied:   PASRR Number:    Discharge Plan: SNF    Current Diagnoses: Patient Active Problem List   Diagnosis Date Noted   Generalized weakness 06/29/2023   Failure to thrive in adult 06/29/2023   Pancreatic cyst 06/29/2023   Urinary retention 06/29/2023   Gross hematuria 06/29/2023   Pressure injury of skin 06/29/2023   Altered mental status 06/28/2023   Influenza A 05/09/2023   Dry eyes 05/08/2023   COPD exacerbation (HCC) 05/08/2023   Dementia without behavioral disturbance (HCC) 02/15/2023   Iron deficiency anemia 02/14/2021   Neurocognitive deficits 04/29/2020   Mixed hyperlipidemia 04/27/2020   Metacarpal bone fracture 04/27/2020   Aortic atherosclerosis (HCC) 04/27/2020   Chronic constipation 04/27/2020   Severe hypothyroidism 04/22/2020   Distention of biliary tract 04/21/2020   AKI (acute kidney injury) (HCC) 04/21/2020   Acquired hypothyroidism 04/21/2020   Abdominal distension 04/20/2020   Acute encephalopathy 10/23/2019   Dehydration 10/23/2019   COPD with acute exacerbation (HCC) 10/23/2019   Chronic hypoxemic respiratory failure (HCC) 10/23/2019   Hypertensive urgency 10/23/2019   Goals of care, counseling/discussion    Palliative care by specialist    DNR (do not resuscitate) discussion    Malignant neoplasm of bronchus and lung (HCC)    Carcinoma, lung (HCC) 06/05/2017   Malignant neoplasm of  upper lobe of right lung (HCC) 05/15/2017   Hepatic cirrhosis (HCC) 04/06/2017   History of colonic polyps 04/06/2017   History of snoring 09/07/2015   Fracture of ulnar styloid    Benign essential HTN    Faintness    Thrombocytopenia (HCC) 09/03/2015   Hyponatremia 09/03/2015   Closed displaced intertrochanteric fracture of right femur (HCC)    Distal radius fracture, right 09/02/2015   Fracture of right hip requiring operative repair (HCC) 09/01/2015   Alcohol abuse 09/01/2015   Tobacco abuse 09/01/2015   COPD (chronic obstructive pulmonary disease) 09/01/2015   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   Weakness of left leg 01/10/2012   Difficulty walking 01/10/2012   Pain in left hip 01/10/2012   Acute blood loss anemia 12/21/2011   Cirrhosis- imaging diagnosis 2013 09/08/2011   Umbilical hernia 09/08/2011   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   Hepatomegaly 06/14/2011   Encounter for screening colonoscopy 06/14/2011   JOINT EFFUSION, KNEE 02/10/2008   KNEE PAIN 02/10/2008    Orientation RESPIRATION BLADDER Height & Weight     Self  O2 (See D/C summary) Indwelling catheter Weight: 154 lb 12.2 oz (70.2 kg) Height:  5\' 9"  (175.3 cm)  BEHAVIORAL SYMPTOMS/MOOD NEUROLOGICAL BOWEL NUTRITION STATUS      Continent Diet (Regular)  AMBULATORY STATUS COMMUNICATION OF NEEDS Skin   Extensive Assist Verbally Normal                       Personal Care Assistance Level of Assistance  Bathing, Feeding, Dressing Bathing Assistance: Maximum assistance Feeding assistance: Independent  Dressing Assistance: Limited assistance     Functional Limitations Info  Sight, Hearing, Speech Sight Info: Adequate Hearing Info: Adequate Speech Info: Adequate    SPECIAL CARE FACTORS FREQUENCY  PT (By licensed PT), OT (By licensed OT)     PT Frequency: 5 times weekly OT Frequency: 5 times weekly            Contractures Contractures Info: Not present    Additional Factors Info  Code  Status, Allergies Code Status Info: DNR Allergies Info: NKA           Current Medications (07/02/2023):  This is the current hospital active medication list Current Facility-Administered Medications  Medication Dose Route Frequency Provider Last Rate Last Admin   albuterol  (PROVENTIL ) (2.5 MG/3ML) 0.083% nebulizer solution 2.5 mg  2.5 mg Nebulization Q6H PRN Emokpae, Courage, MD       carvedilol (COREG) tablet 12.5 mg  12.5 mg Oral BID WC Emokpae, Courage, MD   12.5 mg at 07/02/23 0900   Chlorhexidine  Gluconate Cloth 2 % PADS 6 each  6 each Topical Q0600 Emokpae, Courage, MD   6 each at 07/02/23 0616   donepezil  (ARICEPT ) tablet 10 mg  10 mg Oral QHS Adefeso, Oladapo, DO   10 mg at 07/01/23 2111   enoxaparin  (LOVENOX ) injection 40 mg  40 mg Subcutaneous Q24H Adefeso, Oladapo, DO   40 mg at 07/02/23 0901   feeding supplement (ENSURE ENLIVE / ENSURE PLUS) liquid 237 mL  237 mL Oral BID BM Adefeso, Oladapo, DO   237 mL at 07/02/23 0900   hydrALAZINE  (APRESOLINE ) injection 10 mg  10 mg Intravenous Q6H PRN Emokpae, Courage, MD       levothyroxine  (SYNTHROID ) tablet 75 mcg  75 mcg Oral Q0600 Quintella Buck, Courage, MD   75 mcg at 07/02/23 0615   LORazepam  (ATIVAN ) injection 1 mg  1 mg Intravenous Q12H PRN Colin Dawley, MD   1 mg at 06/29/23 1411   memantine  (NAMENDA ) tablet 5 mg  5 mg Oral BID Adefeso, Oladapo, DO   5 mg at 07/02/23 0900   ondansetron  (ZOFRAN ) tablet 4 mg  4 mg Oral Q6H PRN Adefeso, Oladapo, DO       Or   ondansetron  (ZOFRAN ) injection 4 mg  4 mg Intravenous Q6H PRN Adefeso, Oladapo, DO       rosuvastatin  (CRESTOR ) tablet 10 mg  10 mg Oral Q2200 Adefeso, Oladapo, DO   10 mg at 07/01/23 2111   tamsulosin (FLOMAX) capsule 0.4 mg  0.4 mg Oral QPC supper Marco Severs, MD   0.4 mg at 07/01/23 1702     Discharge Medications: Please see discharge summary for a list of discharge medications.  Relevant Imaging Results:  Relevant Lab Results:   Additional Information SSN: [id number] [place]

## 2023-07-03 DIAGNOSIS — R310 Gross hematuria: Secondary | ICD-10-CM | POA: Diagnosis not present

## 2023-07-03 DIAGNOSIS — R627 Adult failure to thrive: Secondary | ICD-10-CM | POA: Diagnosis not present

## 2023-07-03 DIAGNOSIS — E440 Moderate protein-calorie malnutrition: Secondary | ICD-10-CM | POA: Insufficient documentation

## 2023-07-03 DIAGNOSIS — E039 Hypothyroidism, unspecified: Secondary | ICD-10-CM | POA: Diagnosis not present

## 2023-07-03 DIAGNOSIS — R339 Retention of urine, unspecified: Secondary | ICD-10-CM | POA: Diagnosis not present

## 2023-07-03 MED ORDER — SODIUM CHLORIDE 0.9 % IV BOLUS
500.0000 mL | Freq: Once | INTRAVENOUS | Status: AC
Start: 2023-07-03 — End: 2023-07-03
  Administered 2023-07-03: 500 mL via INTRAVENOUS

## 2023-07-03 MED ORDER — CARVEDILOL 3.125 MG PO TABS
3.1250 mg | ORAL_TABLET | Freq: Two times a day (BID) | ORAL | Status: DC
  Filled 2023-07-03: qty 1

## 2023-07-03 NOTE — Plan of Care (Signed)
  Problem: Clinical Measurements: Goal: Will remain free from infection Outcome: Progressing Goal: Respiratory complications will improve Outcome: Progressing   Problem: Pain Managment: Goal: General experience of comfort will improve and/or be controlled Outcome: Progressing   Problem: Education: Goal: Knowledge of General Education information will improve Description: Including pain rating scale, medication(s)/side effects and non-pharmacologic comfort measures Outcome: Not Progressing   Problem: Activity: Goal: Risk for activity intolerance will decrease Outcome: Not Progressing   Problem: Elimination: Goal: Will not experience complications related to urinary retention Outcome: Not Progressing   Problem: Skin Integrity: Goal: Risk for impaired skin integrity will decrease Outcome: Not Progressing

## 2023-07-03 NOTE — Progress Notes (Signed)
 The patient is transferred to level 300 in stable condition. No S/S of pain or any acute distress.

## 2023-07-03 NOTE — Plan of Care (Signed)

## 2023-07-03 NOTE — Progress Notes (Signed)
 Occupational Therapy Treatment Patient Details Name: Ian Aguilar MRN: [id number] DOB: 1940/08/25 Today's Date: 07/03/2023   History of present illness Ian Aguilar is a 83 y.o. male with medical history significant of COPD, osteoarthritis of hip who presents to the emergency department via EMS from [facility] facility.  Patient was unable to provide any history possibly due to altered mental status or possible underlying dementia, history was obtained from ED physician and ED medical record.  Per report, patient patient has not been eating.  Son reported that patient was coherent 2 days ago (Tuesday 4/29), but is now altered.    He presented to the emergency department yesterday due to generalized weakness that was ongoing for about 2 weeks PTA.  He was previously able to assist with transfers, however, he was no longer able to stand on his own without assist and has also been reaching at things in the air that are not there, thereby raising concern for hallucinations.  Thyroid  levels checked about 2 weeks ago was noted to be abnormal, it was reported that he underwent a change in his Synthroid  dosing recently.   OT comments  Pt agreeable to OT and PT co-treatment. Pt required min A for bed mobility and mod A for functional ambulation/transfers. Pt unsteady in standing and ambulation. Pt able to complete grooming at the sink with mod A due to unsteadiness and need for assist to reach the paper towel. Pt able to don socks seated at EOB with set up assist today. Pt left in the chair with chair alarm set and call bell within reach. Pt will benefit from continued OT in the hospital and recommended venue below to increase strength, balance, and endurance for safe ADL's.         If plan is discharge home, recommend the following:  A lot of help with walking and/or transfers;Assistance with cooking/housework;Assist for transportation;Help with stairs or ramp for entrance;Direct supervision/assist for  medications management;A little help with bathing/dressing/bathroom   Equipment Recommendations  None recommended by OT    Recommendations for Other Services      Precautions / Restrictions Precautions Precautions: Fall Recall of Precautions/Restrictions: Intact Restrictions Weight Bearing Restrictions Per Provider Order: No       Mobility Bed Mobility Overal bed mobility: Needs Assistance Bed Mobility: Supine to Sit     Supine to sit: Min assist     General bed mobility comments: Single hand held assist to pull to sit.    Transfers Overall transfer level: Needs assistance Equipment used: Rolling walker (2 wheels) Transfers: Sit to/from Stand, Bed to chair/wheelchair/BSC Sit to Stand: Min assist, Mod assist     Step pivot transfers: Mod assist     General transfer comment: unsteady; assist to manage RW; verbal and tactile cuing     Balance Overall balance assessment: Needs assistance Sitting-balance support: Feet supported, No upper extremity supported Sitting balance-Leahy Scale: Fair Sitting balance - Comments: fair to good seated at EOB   Standing balance support: Bilateral upper extremity supported, During functional activity, Reliant on assistive device for balance Standing balance-Leahy Scale: Poor Standing balance comment: using RW                           ADL either performed or assessed with clinical judgement   ADL Overall ADL's : Needs assistance/impaired      Grooming: standing with mod A washing hands.  Lower Body Dressing: Set up;Sitting/lateral leans Lower Body Dressing Details (indicate cue type and reason): Able to don socks seated at EOB with extended time and reps.             Functional mobility during ADLs: Moderate assistance;Rolling walker (2 wheels) General ADL Comments: Pt able to ambualte to the door and back to chair; assist to manage RW.     Communication Communication Communication: No  apparent difficulties   Cognition Arousal: Lethargic Behavior During Therapy: WFL for tasks assessed/performed Cognition: No apparent impairments                               Following commands: Intact        Cueing   Cueing Techniques: Verbal cues, Tactile cues                     Pertinent Vitals/ Pain       Pain Assessment Pain Assessment: Faces Faces Pain Scale: No hurt                                                          Frequency  Min 2X/week        Progress Toward Goals  OT Goals(current goals can now be found in the care plan section)  Progress towards OT goals: Progressing toward goals  Acute Rehab OT Goals Patient Stated Goal: improve function OT Goal Formulation: With patient Time For Goal Achievement: 07/13/23 Potential to Achieve Goals: Good ADL Goals Pt Will Perform Grooming: with modified independence;standing Pt Will Perform Lower Body Bathing: with modified independence;sitting/lateral leans Pt Will Perform Lower Body Dressing: with modified independence;sitting/lateral leans Pt Will Transfer to Toilet: with modified independence;ambulating Pt Will Perform Toileting - Clothing Manipulation and hygiene: with modified independence;sitting/lateral leans  Plan      Co-evaluation    PT/OT/SLP Co-Evaluation/Treatment: Yes Reason for Co-Treatment: To address functional/ADL transfers   OT goals addressed during session: ADL's and self-care                         End of Session Equipment Utilized During Treatment: Rolling walker (2 wheels);Gait belt  OT Visit Diagnosis: Unsteadiness on feet (R26.81);Other abnormalities of gait and mobility (R26.89);Muscle weakness (generalized) (M62.81);Other symptoms and signs involving cognitive function   Activity Tolerance Patient tolerated treatment well   Patient Left in chair;with call bell/phone within reach;with chair alarm set   Nurse  Communication          Time: [phone] OT Time Calculation (min): 22 min  Charges: OT General Charges $OT Visit: 1 Visit; no additional time charge due to co-treat.   Anushri Casalino OT, MOT   Thurnell Floss 07/03/2023, 9:38 AM

## 2023-07-03 NOTE — Progress Notes (Addendum)
 Nutrition Follow-up  DOCUMENTATION CODES:   Non-severe (moderate) malnutrition in context of chronic illness  INTERVENTION:   Continue Ensure Enlive po BID, each supplement provides 350 kcal and 20 grams of protein.  NUTRITION DIAGNOSIS:   Moderate Malnutrition related to chronic illness (COPD) as evidenced by mild fat depletion, moderate fat depletion, mild muscle depletion, moderate muscle depletion.  Ongoing   GOAL:   Patient will meet greater than or equal to 90% of their needs  Met with intake of meals and supplements.   MONITOR:   PO intake, Supplement acceptance, Skin  REASON FOR ASSESSMENT:   Consult Assessment of nutrition requirement/status  ASSESSMENT:   Pt with medical history significant of COPD, osteoarthritis of hip who presents with AMS and FTT.  Patient reports that he thinks he is eating well, same appetite and intake as usual. He likes the Ensure supplements.  Currently on a dysphagia 3 diet with thin liquids. Meal intakes variable from 20-90%, averaging 63% for the past 7 meals recorded. He is accepting Ensure supplements BID per nursing documentation. Encouraged patient to continue drinking supplements after discharge.   Lab and medications reviewed.   5/4: Na 134, K 3.3 (received PO supplementation)  Admit weight: 70.2 kg (5/2) Current weight 70.2 kg (5/6)  Patient meets criteria for moderate malnutrition, given mild-moderate depletion of muscle and subcutaneous fat mass.  NUTRITION - FOCUSED PHYSICAL EXAM:  Flowsheet Row Most Recent Value  Orbital Region Mild depletion  Upper Arm Region Moderate depletion  Thoracic and Lumbar Region Mild depletion  Buccal Region Mild depletion  Temple Region Moderate depletion  Clavicle Bone Region Mild depletion  Clavicle and Acromion Bone Region Mild depletion  Scapular Bone Region Mild depletion  Dorsal Hand Mild depletion  Patellar Region Moderate depletion  Anterior Thigh Region Moderate  depletion  Posterior Calf Region Moderate depletion  Edema (RD Assessment) None  Hair Reviewed  Eyes Reviewed  Mouth Reviewed  Skin Reviewed  Nails Reviewed       Diet Order:   Diet Order             DIET DYS 3 Room service appropriate? Yes; Fluid consistency: Thin  Diet effective now                   EDUCATION NEEDS:   No education needs have been identified at this time  Skin:  Skin Assessment: Skin Integrity Issues: Skin Integrity Issues:: Stage I Stage I: lt, rt buttocks  Last BM:  5/5 type 5/6  Height:   Ht Readings from Last 1 Encounters:  07/03/23 5\' 9"  (1.753 m)    Weight:   Wt Readings from Last 1 Encounters:  07/03/23 70.2 kg    Ideal Body Weight:  72.7 kg  BMI:  Body mass index is 22.85 kg/m.  Estimated Nutritional Needs:   Kcal:  1800-2000  Protein:  90-105 grams  Fluid:  1.8-2 L   Barnet Boots RD, LDN, CNSC Contact via secure chat. If unavailable, use group chat "RD Inpatient."

## 2023-07-03 NOTE — Discharge Instructions (Signed)
Nutrition Post Hospital Stay °Proper nutrition can help your body recover from illness and injury.   °Foods and beverages high in protein, vitamins, and minerals help rebuild muscle loss, promote healing, & reduce fall risk.  ° °•In addition to eating healthy foods, a nutrition shake is an easy, delicious way to get the nutrition you need during and after your hospital stay ° °It is recommended that you continue to drink 2 bottles per day of:       Ensure Plus for at least 1 month (30 days) after your hospital stay  ° °Tips for adding a nutrition shake into your routine: °As allowed, drink one with vitamins or medications instead of water or juice °Enjoy one as a tasty mid-morning or afternoon snack °Drink cold or make a milkshake out of it °Drink one instead of milk with cereal or snacks °Use as a coffee creamer °  °Available at the following grocery stores and pharmacies:           °* [employer] * [employer] * [employer]  °* R[employer] Aid          * Walmart * [employer]  °* Wal[employer]reens      * Target  * [employer]J's   °* CVS  * [employer]   °* [facility] [phone]  °          °For COUPONS visit: [email or web address]/join or [email or web address]  ° °Suggested Substitutions °Ensure Plus = Boost Plus = Carnation Breakfast Essentials = Boost Compact °Ensure Active Clear = Boost Breeze °Glucerna Shake = Boost Glucose Control = Carnation Breakfast Essentials SUGAR FREE ° °  °

## 2023-07-03 NOTE — Progress Notes (Addendum)
 PROGRESS NOTE  Ian Aguilar, is a 83 y.o. male, DOB - 1940/11/15, AVW:[id number]  Admit date - 06/28/2023   Admitting Physician Ian Galea, DO  Outpatient Primary MD for the patient is Ian Amel, MD  LOS - 5  Chief Complaint  Patient presents with   Failure To Thrive      Brief Narrative:  83 y.o. male with medical history significant of COPD, osteoarthritis of hip [facility]/28/[facility] with acute metabolic encephalopathy and generalized weakness with failure to thrive/urinary retention and hematuria - 07/03/23 Peer to Peer insurance Appeal successful on 07/03/23 afternoon---possible Dc to [facility] on 07/04/23   -Assessment and Plan: 1)Acute Metabolic Encephalopathy--??  Etiology - Chest x-ray, CT abdomen and pelvis as well as MRI/MRCP abdomen without evidence of acute infection - UA is not suggestive of UTI - No leukocytosis, no fevers - Patient received IV Rocephin  in the ED -Urine and blood cultures NGTD - Okay to hold off on further antibiotics at this time 07/03/23 - Mentation continues to improve, awake, interactive  2)AbNormal Abd Imaging--- CT abdomen and pelvis and MRCP shows liver surface nodularity, compatible with cirrhosis. No discrete focal liver lesion seen. No ascites or Splenomegaly, as well as multiple peribiliary cysts, which are nonspecific but usually seen in the setting of severe chronic liver disease, portal hypertension, ADPKD, etc. 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  - Repeat imaging as outpatient if desired by patient and family  3)Generalized weakness and deconditioning/Falls-- per patient's daughter, and staff at [facility]--until recently patient was able to feed himself, toilet himself and perform most of his ADLs independently. -Until Recently patient was able to ambulate with a  walker for short distances, he independently propelled himself in a wheelchair when he had to go longer distances. -- Currently he is very weak, deconditioned, with significant ambulatory dysfunction and recently had a fall at his ALF facility. -- Patient will benefit from dedicated rehab sessions to help him regain his mobility, endurance and strength. It is felt that patient needs rehab services to restore this patient to their prior level of function to achieve safe transition back to home care. This patient needs rehab services for at least 5 days per week and skilled nursing services daily to facilitate this transition. Rehab is being requested as the most appropriate d/c option for this patient and is NOT felt to be for custodial care as evidenced by previously independent with ADLs including not limited to feeding, toileting, ambulation/independent transfers prior to admission. --Patient has significant limitations with mobility related ADLs- this patient needs to continue to be monitored in the hospital until a SNF bed is obtained as she is not safe to go home with her current physcical limitations - -Physical therapy evaluated recommending SNF rehab - Patient's ALF facility evaluated him on 07/02/2023, and state that are unable to  provide current level of care needed.  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 Claretta Aguilar - Please see urology consult note from 06/29/2023 - Hematuria resolved -Continue Flomax - Follow-up as outpatient with Dr. Claretta Aguilar for voiding trial in a couple of weeks  10)Dementia--- with behavioral disturbance, continue Aricept  and Namenda  = Behavioral modification and reorientation  11)Social/ethics--palliative care consult appreciated patient is a DNR/DNI  Status is: Inpatient   Disposition: The patient is from: ALF              Anticipated d/c is to: SNF               Anticipated d/c date is: 1 day              Patient currently is medically stable to d/c. Barriers: - -07/03/23 Peer to Peer insurance Appeal successful on 07/03/23 afternoon---possible Dc to [facility] on 07/04/23.   Code Status : -  Code Status: Limited: Do not attempt resuscitation (DNR) -DNR-LIMITED -Do Not Intubate/DNI    Family Communication:   Discussed with with Daughter Mrs Ian Aguilar -I called and updated patient's daughter - DVT Prophylaxis  :   - SCDs   enoxaparin  (LOVENOX ) injection 40 mg Start: 06/29/23 1000 SCDs Start: 06/29/23 0043  Lab Results  Component Value Date   PLT 229 06/29/2023   Inpatient Medications  Scheduled Meds:  carvedilol  12.5 mg Oral BID WC   Chlorhexidine  Gluconate Cloth  6 each Topical Q0600   donepezil   10 mg Oral QHS   enoxaparin  (LOVENOX ) injection  40 mg Subcutaneous Q24H   feeding supplement  237 mL Oral BID BM   levothyroxine   75 mcg Oral Q0600   memantine   5 mg Oral BID   rosuvastatin   10 mg Oral Q2200   tamsulosin  0.4 mg Oral QPC supper   Continuous Infusions: PRN Meds:.albuterol , hydrALAZINE , LORazepam , ondansetron  **OR** ondansetron  (ZOFRAN ) IV  Anti-infectives (From admission, onward)    Start     Dose/Rate Route Frequency  Ordered Stop   06/29/23 1415  cefTRIAXone  (ROCEPHIN ) 2 g in sodium chloride  0.9 % 100 mL IVPB  Status:  Discontinued        2 g 200 mL/hr over 30 Minutes Intravenous Every 24 hours 06/29/23 1319 06/29/23 1658      Subjective: Ian Aguilar today has no fevers, no emesis,  No chest pain,    -07/03/23 Peer to Peer insurance Appeal successful on 07/03/23 afternoon---possible Dc to [facility] on 07/04/23  -- More awake, ambulatory physical therapist  -I called and updated patient's daughter by phone on 07/03/2023 - Objective: Vitals:   07/03/23 0606 07/03/23 0853 07/03/23 1052 07/03/23 1323  BP: 133/60  (!) 101/51 (!) 103/55  Pulse: 60 74 (!) 52 (!) 53  Resp: 18  17 (!) 21  Temp: 98.4 F (36.9 C)  98 F (36.7 C) 97.6 F (36.4 C)  TempSrc: Oral  Oral Oral  SpO2: 95%  96% 96%  Weight: 70.2 kg     Height: 5\' 9"  (1.753 m)       Intake/Output Summary (Last 24 hours) at 07/03/2023 1720 Last data filed at 07/03/2023 1300 Gross per 24 hour  Intake 360 ml  Output 550 ml  Net -190 ml   Filed Weights   06/29/23 1006 07/03/23 0606  Weight: 70.2 kg 70.2  kg   Physical Exam Gen:- Awake ,Alert, more coherent and cooperative HEENT:- [place].AT, No sclera icterus Neck-Supple Neck,No JVD,.  Lungs-  CTAB , fair symmetrical air movement CV- S1, S2 normal, regular , Lt sided Porth Cath in situ Abd-  +ve B.Sounds, Abd Soft, No tenderness,    Extremity/Skin:- No  edema, pedal pulses present  Psych-cognitive and memory deficits consistent with underlying dementia , no further significant agitation or restlessness  Neuro-Generalized Weakness , No new focal deficits, no tremors GU---Foley hematuria has resolved  Data Reviewed: I have personally reviewed following labs and imaging studies  CBC: Recent Labs  Lab 06/27/23 1628 06/28/23 1843 06/29/23 0348  WBC 7.6 10.1 7.7  NEUTROABS 6.6 9.0*  --   HGB 13.6 14.4 12.2*  HCT 41.3 42.5 36.7*  MCV 94.9 93.4 92.7  PLT 245 244 229   Basic  Metabolic Panel: Recent Labs  Lab 06/27/23 1628 06/28/23 1843 06/29/23 0348 07/01/23 0430  NA 137 133* 135 134*  K 3.8 4.0 3.6 3.3*  CL 98 96* 97* 100  CO2 28 25 29 26   GLUCOSE 101* 101* 87 83  BUN 19 17 15 14   CREATININE 0.93 0.86 0.82 0.76  CALCIUM  9.3 9.1 8.7* 7.9*  MG 1.9 1.9 1.6*  --   PHOS  --   --  3.2  --    GFR: Estimated Creatinine Clearance: 70.7 mL/min (by C-G formula based on SCr of 0.76 mg/dL). Liver Function Tests: Recent Labs  Lab 06/27/23 1628 06/28/23 1843 06/29/23 0348  AST 18 24 19   ALT 11 13 12   ALKPHOS 75 74 60  BILITOT 1.1 1.6* 1.2  PROT 6.9 6.8 5.7*  ALBUMIN 3.8 3.6 3.1*   Scheduled Meds:  carvedilol  12.5 mg Oral BID WC   Chlorhexidine  Gluconate Cloth  6 each Topical Q0600   donepezil   10 mg Oral QHS   enoxaparin  (LOVENOX ) injection  40 mg Subcutaneous Q24H   feeding supplement  237 mL Oral BID BM   levothyroxine   75 mcg Oral Q0600   memantine   5 mg Oral BID   rosuvastatin   10 mg Oral Q2200   tamsulosin  0.4 mg Oral QPC supper   Continuous Infusions:   LOS: 5 days   Colin Dawley M.D on 07/03/2023 at 5:20 PM  Go to [email or web address] - for contact info  [facility] - Office  [phone]  If 7PM-7AM, please contact night-coverage [email or web address] 07/03/2023, 5:20 PM

## 2023-07-03 NOTE — Progress Notes (Signed)
 Mobility Specialist Progress Note:    07/03/23 1000  Mobility  Activity Stood at bedside  Level of Assistance Moderate assist, patient does 50-74%  Assistive Device None  Range of Motion/Exercises Active;All extremities  Activity Response Tolerated well  Mobility visit 1 Mobility  Mobility Specialist Start Time (ACUTE ONLY) 1000  Mobility Specialist Stop Time (ACUTE ONLY) 1015  Mobility Specialist Time Calculation (min) (ACUTE ONLY) 15 min   Pt received in chair, needing assistance to be repositioned. Required ModA to stand with no AD. Tolerated well, RN in room for assistance. Returned pt to chair, alarm on. Call bell in reach, all needs met.  Glinda Lapping Mobility Specialist Please contact via Special educational needs teacher or  Rehab office at [phone]

## 2023-07-03 NOTE — Progress Notes (Signed)
 Physical Therapy Treatment Patient Details Name: Ian Aguilar MRN: [id number] DOB: 1940-09-13 Today's Date: 07/03/2023   History of Present Illness Ian Aguilar is a 83 y.o. male with medical history significant of COPD, osteoarthritis of hip who presents to the emergency department via EMS from [facility] facility.  Patient was unable to provide any history possibly due to altered mental status or possible underlying dementia, history was obtained from ED physician and ED medical record.  Per report, patient patient has not been eating.  Son reported that patient was coherent 2 days ago (Tuesday 4/29), but is now altered.    He presented to the emergency department yesterday due to generalized weakness that was ongoing for about 2 weeks PTA.  He was previously able to assist with transfers, however, he was no longer able to stand on his own without assist and has also been reaching at things in the air that are not there, thereby raising concern for hallucinations.  Thyroid  levels checked about 2 weeks ago was noted to be abnormal, it was reported that he underwent a change in his Synthroid  dosing recently.    PT Comments  Patient presents slightly lethargic initially, but became more alert and able to follow directions appropriately for participating with physical therapy. Patient had difficulty attempting toe raised due to limited dorsiflexion, required verbal cueing and demonstrating for completing most exercises, increased endurance/distance for gait training with slow labored unsteady cadence and limited mostly due to fatigue. Patient tolerated sitting up in chair after therapy. Patient will benefit from continued skilled physical therapy in hospital and recommended venue below to increase strength, balance, endurance for safe ADLs and gait.     If plan is discharge home, recommend the following: A lot of help with bathing/dressing/bathroom;A lot of help with walking and/or transfers;Help with  stairs or ramp for entrance;Assistance with cooking/housework   Can travel by private vehicle     No  Equipment Recommendations  None recommended by PT    Recommendations for Other Services       Precautions / Restrictions Precautions Precautions: Fall Recall of Precautions/Restrictions: Intact Restrictions Weight Bearing Restrictions Per Provider Order: No     Mobility  Bed Mobility Overal bed mobility: Needs Assistance Bed Mobility: Supine to Sit     Supine to sit: Min assist Sit to supine: Mod assist   General bed mobility comments: increased time, labored movement    Transfers Overall transfer level: Needs assistance Equipment used: Rolling walker (2 wheels) Transfers: Sit to/from Stand, Bed to chair/wheelchair/BSC Sit to Stand: Min assist, Mod assist   Step pivot transfers: Mod assist       General transfer comment: unsteady labored movement    Ambulation/Gait Ambulation/Gait assistance: Mod assist Gait Distance (Feet): 25 Feet Assistive device: Rolling walker (2 wheels) Gait Pattern/deviations: Decreased step length - right, Decreased step length - left, Decreased stride length, Scissoring Gait velocity: decreased     General Gait Details: slow labored cadence with tendency to scissor legs when making turns, limited mostly due to fatigue   Stairs             Wheelchair Mobility     Tilt Bed    Modified Rankin (Stroke Patients Only)       Balance Overall balance assessment: Needs assistance Sitting-balance support: Feet supported, No upper extremity supported Sitting balance-Leahy Scale: Fair Sitting balance - Comments: fair to good seated at EOB   Standing balance support: Bilateral upper extremity supported, During functional activity, Reliant on  assistive device for balance Standing balance-Leahy Scale: Poor Standing balance comment: using RW                            Communication Communication Communication:  No apparent difficulties  Cognition Arousal: Alert, Lethargic Behavior During Therapy: WFL for tasks assessed/performed                             Following commands: Intact      Cueing Cueing Techniques: Verbal cues, Tactile cues  Exercises General Exercises - Lower Extremity Long Arc Quad: Seated, AROM, Strengthening, Both, 10 reps Hip Flexion/Marching: Seated, AROM, Strengthening, Both, 10 reps Heel Raises: Seated, AROM, Strengthening, Both, 10 reps    General Comments        Pertinent Vitals/Pain Pain Assessment Pain Assessment: No/denies pain    Home Living                          Prior Function            PT Goals (current goals can now be found in the care plan section) Acute Rehab PT Goals Patient Stated Goal: return home PT Goal Formulation: With patient Time For Goal Achievement: 07/13/23 Potential to Achieve Goals: Good Progress towards PT goals: Progressing toward goals    Frequency    Min 3X/week      PT Plan      Co-evaluation PT/OT/SLP Co-Evaluation/Treatment: Yes Reason for Co-Treatment: To address functional/ADL transfers PT goals addressed during session: Mobility/safety with mobility;Balance;Proper use of DME;Strengthening/ROM OT goals addressed during session: ADL's and self-care      AM-PAC PT "6 Clicks" Mobility   Outcome Measure  Help needed turning from your back to your side while in a flat bed without using bedrails?: A Little Help needed moving from lying on your back to sitting on the side of a flat bed without using bedrails?: A Lot Help needed moving to and from a bed to a chair (including a wheelchair)?: A Lot Help needed standing up from a chair using your arms (e.g., wheelchair or bedside chair)?: A Lot Help needed to walk in hospital room?: A Lot Help needed climbing 3-5 steps with a railing? : A Lot 6 Click Score: 13    End of Session   Activity Tolerance: Patient tolerated treatment  well;Patient limited by fatigue Patient left: in chair;with call bell/phone within reach;with chair alarm set Nurse Communication: Mobility status PT Visit Diagnosis: Unsteadiness on feet (R26.81);Other abnormalities of gait and mobility (R26.89);Muscle weakness (generalized) (M62.81)     Time: 1610-9604 PT Time Calculation (min) (ACUTE ONLY): 25 min  Charges:    $Gait Training: 8-22 mins $Therapeutic Exercise: 8-22 mins PT General Charges $$ ACUTE PT VISIT: 1 Visit                     12:37 PM, 07/03/23 Walton Guppy, MPT Physical Therapist with [facility] [phone] office [phone] mobile phone

## 2023-07-03 NOTE — Plan of Care (Signed)

## 2023-07-03 NOTE — TOC Progression Note (Signed)
 Transition of Care [facility]) - Progression Note    Patient Details  Name: Ian Aguilar MRN: [id number] Date of Birth: [birth year]  Transition of Care [facility]) CM/SW Contact  Grandville Lax, [place] Phone Number: 07/03/2023, 3:30 PM  Clinical Narrative:    CSW reviewed bed offer with pts son, he requests that it be sent out to additional facilities. CSW sent SNF referral out to more facilities for review and will follow with pts son. CSW spoke with pts son about insurance approval for SNF for a few weeks, however if pt is not at a level to return to ALF pts son will need to be working on a long term care plan, he is understanding of this. CSW explained that SNF benefits will not cover long term, son is understanding., TOC to follow.   Expected Discharge Plan: Assisted Living Barriers to Discharge: Continued Medical Work up  Expected Discharge Plan and Services In-house Referral: Clinical Social Work Discharge Planning Services: CM Consult   Living arrangements for the past 2 months: Assisted Living Facility                                       Social Determinants of Health (SDOH) Interventions SDOH Screenings   Food Insecurity: Patient Unable To Answer (06/29/2023)  Housing: Low Risk  (06/29/2023)  Transportation Needs: Patient Unable To Answer (06/29/2023)  Utilities: Patient Unable To Answer (06/29/2023)  Social Connections: Patient Unable To Answer (06/29/2023)  Recent Concern: Social Connections - Socially Isolated (05/08/2023)  Tobacco Use: Medium Risk (07/02/2023)    Readmission Risk Interventions    06/29/2023   11:09 AM 05/09/2023    9:20 AM  Readmission Risk Prevention Plan  Transportation Screening Complete Complete  HRI or Home Care Consult Complete Complete  Social Work Consult for Recovery Care Planning/Counseling Complete Complete  Palliative Care Screening Not Applicable Not Applicable  Medication Review Oceanographer) Complete Complete

## 2023-07-04 DIAGNOSIS — N401 Enlarged prostate with lower urinary tract symptoms: Secondary | ICD-10-CM | POA: Diagnosis not present

## 2023-07-04 DIAGNOSIS — J984 Other disorders of lung: Secondary | ICD-10-CM | POA: Diagnosis not present

## 2023-07-04 DIAGNOSIS — E441 Mild protein-calorie malnutrition: Secondary | ICD-10-CM | POA: Diagnosis not present

## 2023-07-04 DIAGNOSIS — J441 Chronic obstructive pulmonary disease with (acute) exacerbation: Secondary | ICD-10-CM | POA: Diagnosis not present

## 2023-07-04 DIAGNOSIS — F0390 Unspecified dementia without behavioral disturbance: Secondary | ICD-10-CM | POA: Diagnosis not present

## 2023-07-04 DIAGNOSIS — N390 Urinary tract infection, site not specified: Secondary | ICD-10-CM | POA: Diagnosis not present

## 2023-07-04 DIAGNOSIS — C3431 Malignant neoplasm of lower lobe, right bronchus or lung: Secondary | ICD-10-CM | POA: Diagnosis not present

## 2023-07-04 DIAGNOSIS — J449 Chronic obstructive pulmonary disease, unspecified: Secondary | ICD-10-CM | POA: Diagnosis not present

## 2023-07-04 DIAGNOSIS — C3490 Malignant neoplasm of unspecified part of unspecified bronchus or lung: Secondary | ICD-10-CM | POA: Diagnosis not present

## 2023-07-04 DIAGNOSIS — J99 Respiratory disorders in diseases classified elsewhere: Secondary | ICD-10-CM | POA: Diagnosis not present

## 2023-07-04 DIAGNOSIS — G9341 Metabolic encephalopathy: Secondary | ICD-10-CM | POA: Diagnosis not present

## 2023-07-04 DIAGNOSIS — J189 Pneumonia, unspecified organism: Secondary | ICD-10-CM | POA: Diagnosis not present

## 2023-07-04 DIAGNOSIS — Z515 Encounter for palliative care: Secondary | ICD-10-CM | POA: Diagnosis not present

## 2023-07-04 DIAGNOSIS — Z743 Need for continuous supervision: Secondary | ICD-10-CM | POA: Diagnosis not present

## 2023-07-04 DIAGNOSIS — N138 Other obstructive and reflux uropathy: Secondary | ICD-10-CM | POA: Diagnosis not present

## 2023-07-04 DIAGNOSIS — R4182 Altered mental status, unspecified: Secondary | ICD-10-CM | POA: Diagnosis not present

## 2023-07-04 DIAGNOSIS — Z7189 Other specified counseling: Secondary | ICD-10-CM | POA: Diagnosis not present

## 2023-07-04 DIAGNOSIS — I10 Essential (primary) hypertension: Secondary | ICD-10-CM | POA: Diagnosis not present

## 2023-07-04 DIAGNOSIS — R339 Retention of urine, unspecified: Secondary | ICD-10-CM | POA: Diagnosis not present

## 2023-07-04 DIAGNOSIS — D509 Iron deficiency anemia, unspecified: Secondary | ICD-10-CM | POA: Diagnosis not present

## 2023-07-04 DIAGNOSIS — Z85828 Personal history of other malignant neoplasm of skin: Secondary | ICD-10-CM | POA: Diagnosis not present

## 2023-07-04 DIAGNOSIS — M6289 Other specified disorders of muscle: Secondary | ICD-10-CM | POA: Diagnosis not present

## 2023-07-04 DIAGNOSIS — Z7409 Other reduced mobility: Secondary | ICD-10-CM | POA: Diagnosis not present

## 2023-07-04 DIAGNOSIS — D649 Anemia, unspecified: Secondary | ICD-10-CM | POA: Diagnosis not present

## 2023-07-04 DIAGNOSIS — F419 Anxiety disorder, unspecified: Secondary | ICD-10-CM | POA: Diagnosis not present

## 2023-07-04 DIAGNOSIS — E559 Vitamin D deficiency, unspecified: Secondary | ICD-10-CM | POA: Diagnosis not present

## 2023-07-04 DIAGNOSIS — R1312 Dysphagia, oropharyngeal phase: Secondary | ICD-10-CM | POA: Diagnosis not present

## 2023-07-04 DIAGNOSIS — M6281 Muscle weakness (generalized): Secondary | ICD-10-CM | POA: Diagnosis not present

## 2023-07-04 DIAGNOSIS — Z08 Encounter for follow-up examination after completed treatment for malignant neoplasm: Secondary | ICD-10-CM | POA: Diagnosis not present

## 2023-07-04 DIAGNOSIS — E782 Mixed hyperlipidemia: Secondary | ICD-10-CM

## 2023-07-04 DIAGNOSIS — R41841 Cognitive communication deficit: Secondary | ICD-10-CM | POA: Diagnosis not present

## 2023-07-04 DIAGNOSIS — E039 Hypothyroidism, unspecified: Secondary | ICD-10-CM | POA: Diagnosis not present

## 2023-07-04 DIAGNOSIS — F03918 Unspecified dementia, unspecified severity, with other behavioral disturbance: Secondary | ICD-10-CM | POA: Diagnosis not present

## 2023-07-04 DIAGNOSIS — Z87891 Personal history of nicotine dependence: Secondary | ICD-10-CM | POA: Diagnosis not present

## 2023-07-04 DIAGNOSIS — H04123 Dry eye syndrome of bilateral lacrimal glands: Secondary | ICD-10-CM | POA: Diagnosis not present

## 2023-07-04 DIAGNOSIS — R4189 Other symptoms and signs involving cognitive functions and awareness: Secondary | ICD-10-CM | POA: Diagnosis not present

## 2023-07-04 DIAGNOSIS — C3411 Malignant neoplasm of upper lobe, right bronchus or lung: Secondary | ICD-10-CM | POA: Diagnosis present

## 2023-07-04 DIAGNOSIS — X32XXXD Exposure to sunlight, subsequent encounter: Secondary | ICD-10-CM | POA: Diagnosis not present

## 2023-07-04 DIAGNOSIS — Z1612 Extended spectrum beta lactamase (ESBL) resistance: Secondary | ICD-10-CM | POA: Diagnosis not present

## 2023-07-04 DIAGNOSIS — R52 Pain, unspecified: Secondary | ICD-10-CM | POA: Diagnosis not present

## 2023-07-04 DIAGNOSIS — Z9981 Dependence on supplemental oxygen: Secondary | ICD-10-CM | POA: Diagnosis not present

## 2023-07-04 DIAGNOSIS — J438 Other emphysema: Secondary | ICD-10-CM

## 2023-07-04 DIAGNOSIS — R634 Abnormal weight loss: Secondary | ICD-10-CM | POA: Diagnosis not present

## 2023-07-04 DIAGNOSIS — R278 Other lack of coordination: Secondary | ICD-10-CM | POA: Diagnosis not present

## 2023-07-04 DIAGNOSIS — J101 Influenza due to other identified influenza virus with other respiratory manifestations: Secondary | ICD-10-CM | POA: Diagnosis not present

## 2023-07-04 DIAGNOSIS — E440 Moderate protein-calorie malnutrition: Secondary | ICD-10-CM

## 2023-07-04 DIAGNOSIS — R627 Adult failure to thrive: Secondary | ICD-10-CM | POA: Diagnosis not present

## 2023-07-04 DIAGNOSIS — K862 Cyst of pancreas: Secondary | ICD-10-CM | POA: Diagnosis not present

## 2023-07-04 DIAGNOSIS — I700 Atherosclerosis of aorta: Secondary | ICD-10-CM | POA: Diagnosis not present

## 2023-07-04 DIAGNOSIS — C3491 Malignant neoplasm of unspecified part of right bronchus or lung: Secondary | ICD-10-CM | POA: Diagnosis not present

## 2023-07-04 DIAGNOSIS — L570 Actinic keratosis: Secondary | ICD-10-CM | POA: Diagnosis not present

## 2023-07-04 DIAGNOSIS — R338 Other retention of urine: Secondary | ICD-10-CM | POA: Diagnosis not present

## 2023-07-04 LAB — CULTURE, BLOOD (ROUTINE X 2)

## 2023-07-04 MED ORDER — LEVOTHYROXINE SODIUM 75 MCG PO TABS: 75.0000 ug | ORAL_TABLET | Freq: Every day | ORAL | Status: DC

## 2023-07-04 MED ORDER — CARVEDILOL 3.125 MG PO TABS: 3.1250 mg | ORAL_TABLET | Freq: Two times a day (BID) | ORAL | Status: DC

## 2023-07-04 MED ORDER — TAMSULOSIN HCL 0.4 MG PO CAPS: 0.4000 mg | ORAL_CAPSULE | Freq: Every day | ORAL | Status: DC

## 2023-07-04 NOTE — Care Management Important Message (Signed)
 Important Message  Patient Details  Name: Ian Aguilar MRN: [id number] Date of Birth: 08/20/1940   Important Message Given:  Yes - Medicare IM     [name] 07/04/2023, 11:32 AM

## 2023-07-04 NOTE — Progress Notes (Addendum)
 Palliative: Ian Aguilar is sitting in the [place] chair in his room.  He appears chronically ill and somewhat frail.  He greets me, making and somewhat keeping eye contact.  He seems somewhat improved today.  He is able to tell me his name, and where we are, but not the month.  I do believe that he can make his basic needs known.  There is no family at bedside at this time.  I offer [name] something to drink.  He is able to take and hold the cup for himself, drinking to his satisfaction.  He does not attempt to place the cup on the bedside table, holding it just above.  It is clear that he needs more cueing and direction.  We talked about the plan for short-term rehab.  He states that he feels this is a good idea, asking where he will be going.  I shared that his son, Ian Aguilar, is working with the transition of care team for disposition.  He seems satisfied.  Conference with attending, bedside nursing staff, transition of care team related to patient condition, needs, goals of care, disposition.  Plan: Continue to treat the treatable but no CPR or intubation.  Short-term rehab at [facility], with the ultimate goal of returning to [facility] ALF.      DNR/goldenrod form completed and placed on chart.  35 minutes  Ian Chopin, NP Palliative medicine team Team phone [phone]

## 2023-07-04 NOTE — Progress Notes (Signed)
 Mobility Specialist Progress Note:    07/04/23 1022  Therapy Vitals  Pulse Rate 71  Resp 17  BP (!) 91/56  Patient Position (if appropriate) Sitting  Oxygen  Therapy  SpO2 95 %  O2 Device Room Air  Mobility  Activity Stood at bedside;Transferred to/from [facility];Transferred from bed to chair  Level of Assistance Maximum assist, patient does 25-49%  Assistive Device None;BSC  Distance Ambulated (ft) 5 ft  Range of Motion/Exercises Active;All extremities  Activity Response Tolerated well  Mobility visit 1 Mobility  Mobility Specialist Start Time (ACUTE ONLY) 0955  Mobility Specialist Stop Time (ACUTE ONLY) 1015  Mobility Specialist Time Calculation (min) (ACUTE ONLY) 20 min   Pt received in bed, nursing staff in room. Agreeable to mobility, required MaxA to stand and transfer with no AD. Tolerated well, asx throughout. Left pt in chair with NT, notified RN of BP. All needs met.  Glinda Lapping Mobility Specialist Please contact via Special educational needs teacher or  Rehab office at [phone]

## 2023-07-04 NOTE — TOC Transition Note (Signed)
 Transition of Care [facility]) - Discharge Note   Patient Details  Name: Ian Aguilar MRN: [id number] Date of Birth: August 09, 1940  Transition of Care [facility]) CM/SW Contact:  Grandville Lax, LCSWA Phone Number: 07/04/2023, 2:57 PM  Clinical Narrative:    CSW spoke with pts son to review bed offers, after he spoke with his sister they agreed to accepting bed at [facility]. CSW updated Jorge Newcomer in admissions who states they can accept pt today. CSW updated [facility] insurance auth of bed choice. CSW provided SNF with auth ID number. CSW sent D/C clinicals to SNF via HUB. RN provided with room and report numbers. Med necessity completed and sent to floor for RN. EMS called for transport. TOC signing off.   Final next level of care: Skilled Nursing Facility Barriers to Discharge: Barriers Resolved   Patient Goals and CMS Choice Patient states their goals for this hospitalization and ongoing recovery are:: go to SNF CMS Medicare.gov Compare Post Acute Care list provided to:: Patient Represenative (must comment) Choice offered to / list presented to : Patient, Adult Children      Discharge Placement                Patient to be transferred to facility by: EMS Name of family member notified: Son Patient and family notified of of transfer: 07/04/23  Discharge Plan and Services Additional resources added to the After Visit Summary for   In-house Referral: Clinical Social Work Discharge Planning Services: CM Consult                                 Social Drivers of Health (SDOH) Interventions SDOH Screenings   Food Insecurity: Patient Unable To Answer (06/29/2023)  Housing: Low Risk  (06/29/2023)  Transportation Needs: Patient Unable To Answer (06/29/2023)  Utilities: Patient Unable To Answer (06/29/2023)  Social Connections: Patient Unable To Answer (06/29/2023)  Recent Concern: Social Connections - Socially Isolated (05/08/2023)  Tobacco Use: Medium Risk (07/02/2023)     Readmission Risk  Interventions    06/29/2023   11:09 AM 05/09/2023    9:20 AM  Readmission Risk Prevention Plan  Transportation Screening Complete Complete  HRI or Home Care Consult Complete Complete  Social Work Consult for Recovery Care Planning/Counseling Complete Complete  Palliative Care Screening Not Applicable Not Applicable  Medication Review Oceanographer) Complete Complete

## 2023-07-04 NOTE — Progress Notes (Signed)
 Pt discharged via stretcher by [facility] for transfer to [facility]. Pt report called to facility. Personal belongings sent with patient (hearing aids & charger, upper dentures, glasses, clothing).

## 2023-07-04 NOTE — Discharge Summary (Signed)
 Physician Discharge Summary   Patient: Ian Aguilar MRN: [id number] DOB: 26-Mar-1940  Admit date:     06/28/2023  Discharge date: 07/04/23  Discharge Physician: Justina Oman   PCP: [name], MD   Recommendations at discharge:  Repeat CBC to follow hemoglobin trend Repeat basic metabolic panel to follow twice renal function Repeat TSH and further adjust Synthroid  dose.  Discharge Diagnoses: Principal Problem:   Altered mental status Active Problems:   COPD (chronic obstructive pulmonary disease)   Acquired hypothyroidism   Mixed hyperlipidemia   Dementia without behavioral disturbance (HCC)   Generalized weakness   Failure to thrive in adult   Pancreatic cyst   Urinary retention   Gross hematuria   Pressure injury of skin   Malnutrition of moderate degree  Brief Hospital admission narrative: As per H&P written by Dr. Elyse Hand on 06/28/2023 Ian Aguilar is a 83 y.o. male with medical history significant of COPD, osteoarthritis of hip who presents to the emergency department via EMS from [facility] facility.  Patient was unable to provide any history possibly due to altered mental status or possible underlying dementia, history was obtained from ED physician and ED medical record.  Per report, patient patient has not been eating.  Son reported that patient was coherent 2 days ago (Tuesday 4/29), but is now altered.   He presented to the emergency department yesterday due to generalized weakness that was ongoing for about 2 weeks PTA.  He was previously able to assist with transfers, however, he was no longer able to stand on his own without assist and has also been reaching at things in the air that are not there, thereby raising concern for hallucinations.  Thyroid  levels checked about 2 weeks ago was noted to be abnormal, it was reported that he underwent a change in his Synthroid  dosing recently.   ED Course:  In the emergency department, he was hemodynamically stable.   Workup in the ED showed normal CBC and BMP except for sodium of 133, chloride 96, blood glucose 1 1.  Troponin x 2 was normal, urinalysis was normal, magnesium  1.9, TSH 0.021, free T4 - 1.99. CT head without contrast showed no acute finding MRI head without contrast showed no acute intracranial abnormality CT abdomen and pelvis with contrast showed advanced hepatic cirrhosis.  Progressive asymmetric moderate intrahepatic biliary ductal dilation  involving the left biliary tree to the level of the confluence of the hepatic ducts with relative decompression of the right and common duct. Chest x-ray showed no acute findings IV LR 1 L was provided.  Hospitalist was asked to admit patient for further evaluation and management.  Assessment and Plan: 1)Acute Metabolic Encephalopathy--??  Etiology - Chest x-ray, CT abdomen and pelvis as well as MRI/MRCP abdomen without evidence of acute infection - UA is not suggestive of UTI - No leukocytosis, no fevers - Patient received IV Rocephin  in the ED -Urine and blood cultures NGTD - Okay to hold off on further antibiotics at this time 07/03/23 - Mentation continues to improve and essentially back to patient's baseline.   2)AbNormal Abd Imaging--- CT abdomen and pelvis and MRCP shows liver surface nodularity, compatible with cirrhosis. No discrete focal liver lesion seen. No ascites or Splenomegaly, as well as multiple peribiliary cysts, which are nonspecific but usually seen in the setting of severe chronic liver disease, portal hypertension, ADPKD, etc. 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  -  Repeat imaging as outpatient if desired by patient and family.   3)Generalized weakness and deconditioning/Falls-- per patient's daughter, and staff at [facility] ALF--until recently patient was able to feed himself, toilet himself and perform most of his  ADLs independently. -Until Recently patient was able to ambulate with a walker for short distances, he independently propelled himself in a wheelchair when he had to go longer distances. -- Currently he is very weak, deconditioned, with significant ambulatory dysfunction and recently had a fall at his ALF facility. -- Patient will benefit from dedicated rehab sessions to help him regain his mobility, endurance and strength. It is felt that patient needs rehab services to restore this patient to their prior level of function to achieve safe transition back to home care. This patient needs rehab services for at least 5 days per week and skilled nursing services daily to facilitate this transition. Rehab is being requested as the most appropriate d/c option for this patient and is NOT felt to be for custodial care as evidenced by previously independent with ADLs including not limited to feeding, toileting, ambulation/independent transfers prior to admission. --Patient has significant limitations with mobility related ADLs- this patient needs to continue to be monitored in the hospital until a SNF bed is obtained as she is not safe to go home with her current physcical limitations -Physical therapy evaluated recommending SNF rehab - Patient's ALF facility evaluated him on 07/02/2023, and state that are unable to  provide current level of care needed.  Patient is requiring a lot more assistance with transfer and ADLs -07/03/23 Peer to Peer insurance Appeal successful on 07/03/23 afternoon---possible Dc to [facility] for conditioning and rehabilitation.   4)Hypothyroidism---TSH was low at 0.021 on 06/27/2023 , free T4 was elevated at 1.99. - Repeat TSH in 4 to 6 weeks -Continue Synthroid  at adjusted dose.   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  6)Hypomagnesemia--- replaced, no vomiting no diarrhea, potassium and phosphorus WNL at time of discharge. - Continue to follow electrolytes trend.   7)COPD- -no acute exacerbation at this time continue as needed bronchodilators.   8)HLD- -continue Crestor    9)Urinary Retention/Hematuria--- Foley inserted in the ED, hematuria resolved, discussed with urologist Dr. Claretta Croft - Keep Foley catheter in place until follow-up with urology service - Repeat CBC to follow hemoglobin trend - Continue the use of Flomax and maintain adequate hydration.   10)Dementia--- with behavioral disturbance, continue Aricept  and Namenda  -Continue supportive care and constant reorientation. - Stable mood at discharge.   11)Social/ethics -DNR/DNI. - Appreciate assistance and recommendation by palliative care service.  12) stage I pressure injury - Present at time of admission - No signs of superimposed infection - Continue consult repositioning and local care.  Consultants: Urology service and palliative care service Procedures performed: See below for x-ray reports. Disposition: Skilled nursing facility. Diet recommendation: Dysphagia 3 diet.  DISCHARGE MEDICATION: Allergies as of 07/04/2023   No Known Allergies      Medication List     STOP taking these medications    loperamide 2 MG capsule Commonly known as: IMODIUM   pantoprazole  40 MG tablet Commonly known as: PROTONIX        TAKE these medications    acetaminophen  325 MG tablet Commonly known as: TYLENOL  Take 325 mg by mouth every 4 (four) hours as needed for mild pain (pain score 1-3).  albuterol  108 (90 Base) MCG/ACT inhaler Commonly known as: VENTOLIN  HFA Inhale 1 puff into the lungs 4 (four) times daily as needed. What changed: reasons to take this   Aloe Vera Gel Apply 1 Application topically in the morning, at noon, and at bedtime. Apply to nasal carriage   benzonatate  200  MG capsule Commonly known as: TESSALON  Take 200 mg by mouth 3 (three) times daily as needed for cough.   carvedilol 3.125 MG tablet Commonly known as: COREG Take 1 tablet (3.125 mg total) by mouth 2 (two) times daily with a meal.   cholecalciferol  25 MCG (1000 UNIT) tablet Commonly known as: VITAMIN D3 Take 1,000 Units by mouth daily.   CULTURELLE DIGESTIVE DAILY PO Take 1 capsule by mouth daily.   cyanocobalamin  1000 MCG tablet Commonly known as: VITAMIN B12 Take 1 tablet (1,000 mcg total) by mouth daily.   cycloSPORINE  0.05 % ophthalmic emulsion Commonly known as: RESTASIS  Place 1 drop into both eyes 2 (two) times daily.   desonide 0.05 % cream Commonly known as: DESOWEN Apply 1 Application topically 2 (two) times daily as needed (itching). Apply to face, scalp, and ears   diclofenac  Sodium 1 % Gel Commonly known as: VOLTAREN  Apply 2 g topically 4 (four) times daily as needed (pain). Apply to left lateral ankle   docusate sodium  100 MG capsule Commonly known as: COLACE Take 100 mg by mouth 2 (two) times daily.   donepezil  10 MG tablet Commonly known as: ARICEPT  TAKE 1 TABLET BY MOUTH AT BEDTIME.   FeroSul 325 (65 Fe) MG tablet Generic drug: ferrous sulfate Take 325 mg by mouth daily with breakfast.   GoodSense Artificial Tears 0.5-0.6 % Soln Generic drug: Polyvinyl Alcohol-Povidone Place 2 drops into both eyes every 8 (eight) hours as needed (dry eyes).   ipratropium-albuterol  0.5-2.5 (3) MG/3ML Soln Commonly known as: DUONEB Take 3 mLs by nebulization every 4 (four) hours as needed (wheezing, shortness of breath).   levothyroxine  75 MCG tablet Commonly known as: SYNTHROID  Take 1 tablet (75 mcg total) by mouth daily at 6 (six) AM. Start taking on: Jul 05, 2023 What changed:  medication strength how much to take when to take this   melatonin 5 MG Tabs Take 5 mg by mouth at bedtime.   memantine  5 MG tablet Commonly known as: NAMENDA  Take 1 tablet (5 mg  at night) for 2 weeks, then increase to 1 tablet (5 mg) twice a day What changed:  how much to take how to take this when to take this additional instructions   METAMUCIL PO Take 1 capsule by mouth in the morning and at bedtime.   Mucus Relief 600 MG 12 hr tablet Generic drug: guaiFENesin  Take 600 mg by mouth 2 (two) times daily as needed for cough.   OXYGEN  Inhale 2 L into the lungs continuous.   PARoxetine 20 MG tablet Commonly known as: PAXIL Take 20 mg by mouth daily.   rosuvastatin  10 MG tablet Commonly known as: CRESTOR  Take 1 tablet (10 mg total) by mouth daily.   tamsulosin 0.4 MG Caps capsule Commonly known as: FLOMAX Take 1 capsule (0.4 mg total) by mouth daily after supper.   Trelegy Ellipta 200-62.5-25 MCG/ACT Aepb Generic drug: Fluticasone -Umeclidin-Vilant Inhale 1 puff into the lungs daily. Rinse mouth after use        Contact information for follow-up providers     [name], MD. Schedule an appointment as soon as possible for a visit in 2 week(s).   Specialty:  Family Medicine Why: After discharge from the skilled nursing facility. Contact information: [place] [address] [phone]              Contact information for after-discharge care     Destination     [facility] .   Service: Skilled Nursing Contact information: [address] [facility] [place]  [address] [phone]                    Discharge Exam: Filed Weights   06/29/23 1006 07/03/23 0606  Weight: 70.2 kg 70.2 kg   Gen:- Awake ,Alert, more coherent and cooperative HEENT:- [place].AT, No sclera icterus Neck-Supple Neck,No JVD,.  Lungs-  CTAB , fair symmetrical air movement CV- S1, S2 normal, regular , Lt sided [place] Cath in situ Abd-  +ve B.Sounds, Abd Soft, No tenderness,    Extremity/Skin:- No  edema, pedal pulses present  Psych-cognitive and memory deficits consistent with underlying dementia , no  further significant agitation or restlessness  Neuro-Generalized Weakness , No new focal deficits, no tremors GU---Foley hematuria has resolved  Condition at discharge: Stable and improved.  The results of significant diagnostics from this hospitalization (including imaging, microbiology, ancillary and laboratory) are listed below for reference.   Imaging Studies: MR ABDOMEN MRCP W WO CONTAST Result Date: 06/29/2023 CLINICAL DATA:  Gallbladder/biliary cancer, monitor; Gallbladder/biliary cancer, monitor [phone] Abnormal CT of the abdomen 220388. EXAM: MRI ABDOMEN WITHOUT AND WITH CONTRAST (INCLUDING MRCP) TECHNIQUE: Multiplanar multisequence MR imaging of the abdomen was performed both before and after the administration of intravenous contrast. Heavily T2-weighted images of the biliary and pancreatic ducts were obtained, and three-dimensional MRCP images were rendered by post processing. CONTRAST:  7mL GADAVIST  GADOBUTROL  1 MMOL/ML IV SOLN COMPARISON:  CT scan abdomen and pelvis from 06/28/2023. FINDINGS: Note examination is mild-to-moderately limited due to patient's motion during data acquisition. Lower chest: Unremarkable MR appearance to the lung bases. No pleural effusion. No pericardial effusion. Normal heart size. Hepatobiliary: Redemonstration of liver surface nodularity, compatible with cirrhosis. No discrete focal liver lesion seen. Redemonstration of multiple well-circumscribed cystic lesions mainly surrounding the left lobe bile ducts but small amount of cysts are also seen surrounding the right lobe bile ducts as well as at the confluence. The MRCP images are motion degraded but these cystic lesions are likely separate to the adjacent bile ducts, which are not dilated. These are favored to represent peribiliary cysts which usually occurs in the setting of severe chronic liver disease, portal hypertension, ADPKD, etc. There is interval increase in the size and number of cysts when compared to  the prior MRI from 04/21/2020. The extrahepatic bile ducts are not dilated. No choledocholithiasis. No obstructing mass seen. Unremarkable gallbladder. Pancreas: Small/atrophic pancreas. Slightly prominent main pancreatic duct. There are 2 cystic lesions in the pancreatic head/uncinate process (measuring 9 x 14 mm) and pancreatic body (measuring 9 x 9 mm), which previously measured approximately 8 x 11 mm and 9 x 10 mm, respectively. The lesions are nonenhancing. 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 Continued follow-up is recommended in 2 years to document stability. Spleen:  Within normal limits in size and appearance. No focal mass. Adrenals/Urinary Tract: Unremarkable adrenal glands. No hydroureteronephrosis. No suspicious renal mass. Stomach/Bowel: Visualized portions within the abdomen are unremarkable. No disproportionate dilation of bowel loops. Vascular/Lymphatic: No pathologically enlarged lymph nodes identified. No abdominal aortic aneurysm  demonstrated. No ascites. Other:  None. Musculoskeletal: No suspicious bone lesions identified. IMPRESSION: 1. Motion degraded exam. 2. Redemonstration of liver surface nodularity, compatible with cirrhosis. No discrete focal liver lesion seen. No ascites or splenomegaly. 3. Redemonstration of multiple peribiliary cysts, which are nonspecific but usually seen in the setting of severe chronic liver disease, portal hypertension, ADPKD, etc. 4. 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 Continued follow-up is recommended. 5. Multiple other nonacute observations, as described above. Electronically Signed   By: Beula Brunswick M.D.   On: 06/29/2023 15:52   MR 3D Recon At Scanner Result Date: 06/29/2023 CLINICAL DATA:  Gallbladder/biliary  cancer, monitor; Gallbladder/biliary cancer, monitor [phone] Abnormal CT of the abdomen 220388. EXAM: MRI ABDOMEN WITHOUT AND WITH CONTRAST (INCLUDING MRCP) TECHNIQUE: Multiplanar multisequence MR imaging of the abdomen was performed both before and after the administration of intravenous contrast. Heavily T2-weighted images of the biliary and pancreatic ducts were obtained, and three-dimensional MRCP images were rendered by post processing. CONTRAST:  7mL GADAVIST  GADOBUTROL  1 MMOL/ML IV SOLN COMPARISON:  CT scan abdomen and pelvis from 06/28/2023. FINDINGS: Note examination is mild-to-moderately limited due to patient's motion during data acquisition. Lower chest: Unremarkable MR appearance to the lung bases. No pleural effusion. No pericardial effusion. Normal heart size. Hepatobiliary: Redemonstration of liver surface nodularity, compatible with cirrhosis. No discrete focal liver lesion seen. Redemonstration of multiple well-circumscribed cystic lesions mainly surrounding the left lobe bile ducts but small amount of cysts are also seen surrounding the right lobe bile ducts as well as at the confluence. The MRCP images are motion degraded but these cystic lesions are likely separate to the adjacent bile ducts, which are not dilated. These are favored to represent peribiliary cysts which usually occurs in the setting of severe chronic liver disease, portal hypertension, ADPKD, etc. There is interval increase in the size and number of cysts when compared to the prior MRI from 04/21/2020. The extrahepatic bile ducts are not dilated. No choledocholithiasis. No obstructing mass seen. Unremarkable gallbladder. Pancreas: Small/atrophic pancreas. Slightly prominent main pancreatic duct. There are 2 cystic lesions in the pancreatic head/uncinate process (measuring 9 x 14 mm) and pancreatic body (measuring 9 x 9 mm), which previously measured approximately 8 x 11 mm and 9 x 10 mm, respectively. The lesions are nonenhancing.  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 Continued follow-up is recommended in 2 years to document stability. Spleen:  Within normal limits in size and appearance. No focal mass. Adrenals/Urinary Tract: Unremarkable adrenal glands. No hydroureteronephrosis. No suspicious renal mass. Stomach/Bowel: Visualized portions within the abdomen are unremarkable. No disproportionate dilation of bowel loops. Vascular/Lymphatic: No pathologically enlarged lymph nodes identified. No abdominal aortic aneurysm demonstrated. No ascites. Other:  None. Musculoskeletal: No suspicious bone lesions identified. IMPRESSION: 1. Motion degraded exam. 2. Redemonstration of liver surface nodularity, compatible with cirrhosis. No discrete focal liver lesion seen. No ascites or splenomegaly. 3. Redemonstration of multiple peribiliary cysts, which are nonspecific but usually seen in the setting of severe chronic liver disease, portal hypertension, ADPKD, etc. 4. 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 Continued follow-up is recommended. 5. Multiple other nonacute observations, as described above. Electronically Signed   By: Beula Brunswick M.D.   On: 06/29/2023 15:52  CT ABDOMEN PELVIS W CONTRAST Result Date: 06/28/2023 CLINICAL DATA:  Acute nonlocalized abdominal pain EXAM: CT ABDOMEN AND PELVIS WITH CONTRAST TECHNIQUE: Multidetector CT imaging of the abdomen and pelvis was performed using the standard protocol following bolus administration of intravenous contrast. RADIATION DOSE REDUCTION: This exam was performed according to the departmental dose-optimization program which includes automated exposure control, adjustment of the mA and/or kV according to patient size and/or use of iterative reconstruction  technique. CONTRAST:  OMNIPAQUE  IOHEXOL  300 MG/ML  SOLN COMPARISON:  04/20/2020 FINDINGS: Lower chest: No acute abnormality. Extensive coronary artery calcification. Hepatobiliary: Advanced cirrhosis with a relatively shrunken appearance of the no enhancing intrahepatic mass identified. However, there is asymmetric moderate intrahepatic biliary ductal dilation involving the left biliary tree to the level of the confluence of the hepatic ducts with relative decompression of the right and common duct. This appears progressive since prior examination. An extrinsic obstructing lesion is not clearly identified, however, this is not optimally assessed on this examination. Additionally, a biliary stricture or intraluminal mass could result in a similar appearance. The gallbladder is unremarkable. Pancreas: 12 mm cystic lesion within the uncinate process of the pancreas has enlarged in the interval possibly representing a a dilated pancreatic side branch, intrapancreatic pseudocyst, or a cystic pancreatic neoplasm. No superimposed peripancreatic inflammatory changes or fluid collections identified. Spleen: Normal in size without focal abnormality. Adrenals/Urinary Tract: Adrenal glands are unremarkable. The kidneys are normal in size and position. Mild bilateral nonspecific perinephric edema. The kidneys are otherwise unremarkable. The bladder is distended, suggesting bladder outlet obstruction, and there is mild perivesicular inflammatory stranding which may relate to superimposed infectious or inflammatory cystitis. Stomach/Bowel: Stomach is within normal limits. Appendix appears normal. No evidence of bowel wall thickening, distention, or inflammatory changes. Vascular/Lymphatic: Aortic atherosclerosis. No enlarged abdominal or pelvic lymph nodes. Reproductive: Moderate prostatic hypertrophy Other: No abdominal wall hernia or abnormality. No abdominopelvic ascites. Musculoskeletal: Right hip ORIF and left total hip  arthroplasty has been performed. The osseous structures are diffusely osteopenic. Healed right rib fractures are noted. Remote L1 compression deformity with 50% loss of height anteriorly. No acute bone abnormality. IMPRESSION: 1. Advanced hepatic cirrhosis. 2. Progressive asymmetric moderate intrahepatic biliary ductal dilation involving the left biliary tree to the level of the confluence of the hepatic ducts with relative decompression of the right and common duct. An extrinsic obstructing lesion is not clearly identified, however, this is not optimally assessed on this examination. Additionally, a biliary stricture or intraluminal mass such as cholangiocarcinoma could result in a similar appearance. Correlation with liver function tests is recommended. Additionally, dedicated MRI/MRCP examination is recommended. 3. 12 mm cystic lesion within the uncinate process of the pancreas possibly representing a dilated pancreatic side branch, intrapancreatic pseudocyst, or a cystic pancreatic neoplasm. This could be further assessed with MRI examination once the patient's acute issues have resolved. 4. Bladder distention in keeping with bladder outlet obstruction. Mild perivesicular inflammatory stranding which may relate to superimposed infectious or inflammatory cystitis. Catheter drainage and correlation with urinalysis is recommended. 5. Moderate prostatic hypertrophy. 6. Extensive coronary artery calcification. 7. Remote L1 compression deformity. Aortic Atherosclerosis (ICD10-I70.0). Electronically Signed   By: Worthy Heads M.D.   On: 06/28/2023 19:44   DG Chest Portable 1 View Result Date: 06/28/2023 EXAM: 1 VIEW XRAY OF THE CHEST 06/28/2023 06:50:58 PM COMPARISON: 06/27/2023 CLINICAL HISTORY: FTT. from [facility]. Staff reports patient is not eating. VS with EMS 135/70, 80 HR, 15 RR, 2L [place] (chronic), 131 CBG FINDINGS: LUNGS  AND PLEURA: No consolidation. No pulmonary edema. No pleural effusion. No pneumothorax.  HEART AND MEDIASTINUM: No acute abnormality of the cardiac and mediastinal silhouettes. BONES AND SOFT TISSUES: No acute osseous abnormality. LINES AND TUBES: Left chest port terminating at the cavoatrial junction. VASCULATURE: Thoracic aortic atherosclerosis. IMPRESSION: 1. No acute findings. Electronically signed by: Zadie Herter MD 06/28/2023 07:19 PM EDT RP Workstation: [id number]   MR BRAIN WO CONTRAST Result Date: 06/27/2023 CLINICAL DATA:  Initial evaluation for acute neuro deficit, stroke suspected. EXAM: MRI HEAD WITHOUT CONTRAST TECHNIQUE: Multiplanar, multiecho pulse sequences of the brain and surrounding structures were obtained without intravenous contrast. COMPARISON:  Comparison made with CT from earlier the same day. FINDINGS: Brain: Examination markedly limited due to extensive susceptibility artifact emanating from the region of the right ear. Additionally, examination is markedly degraded by motion. Diffuse prominence of the CSF containing spaces compatible generalized cerebral atrophy. Patchy T2/FLAIR hyperintensity involving the periventricular deep white matter both cerebral hemispheres, most characteristic of chronic micro vessel ischemic disease, mild for age. No visible foci of restricted diffusion to suggest acute or subacute ischemia. No visible areas of chronic cortical infarction. No visible acute or chronic intracranial blood products. No visible mass lesion, mass effect or midline shift. Ventricular prominence related global parenchymal volume loss of hydrocephalus. No extra-axial fluid collection. Pituitary gland and suprasellar region within normal limits. Vascular: Major intracranial vascular flow voids are grossly maintained at the skull base. Skull and upper cervical spine: Cranial junctional limits. Bone marrow signal intensity normal. No scalp soft tissue abnormality. Sinuses/Orbits: Prior bilateral ocular lens replacement. Paranasal sinuses are largely clear. No visible  mastoid effusion. Effusion, although the right mastoid air cells are largely obscured. Other: None. IMPRESSION: 1. Technically limited exam due to extensive susceptibility artifact emanating from the region of the right ear. Additionally, examination is markedly degraded by motion. 2. No acute intracranial abnormality. 3. 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 Electronically Signed   By: Virgia Griffins M.D.   On: 06/27/2023 20:49   CT HEAD WO CONTRAST Result Date: 06/27/2023 CLINICAL DATA:  Mental status change of unknown cause. Visual disturbance. EXAM: CT HEAD WITHOUT CONTRAST TECHNIQUE: Contiguous axial images were obtained from the base of the skull through the vertex without intravenous contrast. RADIATION DOSE REDUCTION: This exam was performed according to the departmental dose-optimization program which includes automated exposure control, adjustment of the mA and/or kV according to patient size and/or use of iterative reconstruction technique. COMPARISON:  Head CT 1 week ago. FINDINGS: Brain: Motion degraded study. No acute finding. Age related volume loss with some temporal lobe predominance. Chronic small-vessel ischemic changes of the white matter. No acute stroke, mass, hemorrhage, hydrocephalus or extra-axial collection. Vascular: No acute vascular finding. Skull: Negative Sinuses/Orbits: Clear/normal Other: Small chronic mastoid effusion on the right. IMPRESSION: Motion degraded study. No acute finding. Age related volume loss with some temporal lobe predominance. Chronic small-vessel ischemic changes of the white matter. Electronically Signed   By: Bettylou Brunner M.D.   On: 06/27/2023 16:09   DG Chest Port 1 View Result Date: 06/27/2023 CLINICAL DATA:  Weakness. EXAM: PORTABLE CHEST 1 VIEW COMPARISON:  06/20/2023. FINDINGS: Patient is rotated to the left. Stable cardiomediastinal silhouette. Aortic atherosclerosis. Stable left subclavian Port-A-Cath.  No focal consolidation, pleural effusion, or pneumothorax. No acute osseous abnormality. IMPRESSION: No acute cardiopulmonary findings. Electronically Signed   By: Mannie Seek M.D.   On: 06/27/2023 15:56   CT Head Wo Contrast Result Date: 06/20/2023 CLINICAL  DATA:  Memory loss EXAM: CT HEAD WITHOUT CONTRAST TECHNIQUE: Contiguous axial images were obtained from the base of the skull through the vertex without intravenous contrast. RADIATION DOSE REDUCTION: This exam was performed according to the departmental dose-optimization program which includes automated exposure control, adjustment of the mA and/or kV according to patient size and/or use of iterative reconstruction technique. COMPARISON:  CT head 01/10/2022 FINDINGS: Brain: Cerebral ventricle sizes are concordant with the degree of cerebral volume loss. Patchy and confluent areas of decreased attenuation are noted throughout the deep and periventricular white matter of the cerebral hemispheres bilaterally, compatible with chronic microvascular ischemic disease. No evidence of large-territorial acute infarction. No parenchymal hemorrhage. No mass lesion. No extra-axial collection. No mass effect or midline shift. No hydrocephalus. Basilar cisterns are patent. Vascular: No hyperdense vessel. Atherosclerotic calcifications are present within the cavernous internal carotid arteries. Skull: No acute fracture or focal lesion. Sinuses/Orbits: Paranasal sinuses and mastoid air cells are clear. Bilateral lens replacement. Otherwise the orbits are unremarkable. Other: 2 mm left frontal scalp hematoma. IMPRESSION: No acute intracranial abnormality. Electronically Signed   By: Morgane  Naveau M.D.   On: 06/20/2023 18:22   DG Chest Port 1 View Result Date: 06/20/2023 CLINICAL DATA:  Shortness of breath EXAM: PORTABLE CHEST 1 VIEW COMPARISON:  May 08, 2023 FINDINGS: The heart size and mediastinal contours are within normal limits. Both lungs are clear. The  visualized skeletal structures are unremarkable. No change in the position of the left subclavian Infusaport catheter IMPRESSION: No active disease. Electronically Signed   By: Fredrich Jefferson M.D.   On: 06/20/2023 16:50    Microbiology: Results for orders placed or performed during the hospital encounter of 06/28/23  Urine Culture     Status: Abnormal   Collection Time: 06/28/23 10:08 PM   Specimen: Urine, Clean Catch  Result Value Ref Range Status   Specimen Description   Final    URINE, CLEAN CATCH Performed at [facility], [address] [place], [place] [address]    Special Requests   Final    NONE Performed at [facility], [address] [place], [place] [address]    Culture MULTIPLE SPECIES PRESENT, SUGGEST RECOLLECTION (A)  Final   Report Status 06/30/2023 FINAL  Final  MRSA Next Gen by PCR, Nasal     Status: None   Collection Time: 06/29/23  8:00 AM   Specimen: Nasal Mucosa; Nasal Swab  Result Value Ref Range Status   MRSA by PCR Next Gen NOT DETECTED NOT DETECTED Final    Comment: (NOTE) The GeneXpert MRSA Assay (FDA approved for NASAL specimens only), is one component of a comprehensive MRSA colonization surveillance program. It is not intended to diagnose MRSA infection nor to guide or monitor treatment for MRSA infections. Test performance is not FDA approved in patients less than 40 years old. Performed at [facility], [address] [place], [place] [address]   Culture, blood (Routine X 2) w Reflex to ID Panel     Status: None   Collection Time: 06/29/23  5:50 PM   Specimen: BLOOD  Result Value Ref Range Status   Specimen Description BLOOD RIGHT ANTECUBITAL  Final   Special Requests   Final    BOTTLES DRAWN AEROBIC ONLY Blood Culture results may not be optimal due to an inadequate volume of blood received in culture bottles   Culture   Final    NO GROWTH 5 DAYS Performed at [facility], [address], [place], [place] [address]  Report Status 07/04/2023  FINAL  Final  Culture, blood (Routine X 2) w Reflex to ID Panel     Status: None   Collection Time: 06/29/23  6:05 PM   Specimen: BLOOD  Result Value Ref Range Status   Specimen Description BLOOD BLOOD RIGHT HAND  Final   Special Requests   Final    BOTTLES DRAWN AEROBIC ONLY Blood Culture results may not be optimal due to an inadequate volume of blood received in culture bottles   Culture   Final    NO GROWTH 5 DAYS Performed at [facility], [address] [place], [place] [address]    Report Status 07/04/2023 FINAL  Final    Labs: CBC: Recent Labs  Lab 06/27/23 1628 06/28/23 1843 06/29/23 0348  WBC 7.6 10.1 7.7  NEUTROABS 6.6 9.0*  --   HGB 13.6 14.4 12.2*  HCT 41.3 42.5 36.7*  MCV 94.9 93.4 92.7  PLT 245 244 229   Basic Metabolic Panel: Recent Labs  Lab 06/27/23 1628 06/28/23 1843 06/29/23 0348 07/01/23 0430  NA 137 133* 135 134*  K 3.8 4.0 3.6 3.3*  CL 98 96* 97* 100  CO2 28 25 29 26   GLUCOSE 101* 101* 87 83  BUN 19 17 15 14   CREATININE 0.93 0.86 0.82 0.76  CALCIUM  9.3 9.1 8.7* 7.9*  MG 1.9 1.9 1.6*  --   PHOS  --   --  3.2  --    Liver Function Tests: Recent Labs  Lab 06/27/23 1628 06/28/23 1843 06/29/23 0348  AST 18 24 19   ALT 11 13 12   ALKPHOS 75 74 60  BILITOT 1.1 1.6* 1.2  PROT 6.9 6.8 5.7*  ALBUMIN 3.8 3.6 3.1*   CBG: Recent Labs  Lab 06/27/23 1610  GLUCAP 112*    Discharge time spent: greater than 30 minutes.  Signed: Justina Oman, MD [facility] 07/04/2023

## 2023-07-05 DIAGNOSIS — M6281 Muscle weakness (generalized): Secondary | ICD-10-CM | POA: Diagnosis not present

## 2023-07-05 DIAGNOSIS — E039 Hypothyroidism, unspecified: Secondary | ICD-10-CM | POA: Diagnosis not present

## 2023-07-05 DIAGNOSIS — J441 Chronic obstructive pulmonary disease with (acute) exacerbation: Secondary | ICD-10-CM | POA: Diagnosis not present

## 2023-07-05 DIAGNOSIS — E441 Mild protein-calorie malnutrition: Secondary | ICD-10-CM | POA: Diagnosis not present

## 2023-07-05 DIAGNOSIS — F0390 Unspecified dementia without behavioral disturbance: Secondary | ICD-10-CM | POA: Diagnosis not present

## 2023-07-05 DIAGNOSIS — K862 Cyst of pancreas: Secondary | ICD-10-CM | POA: Diagnosis not present

## 2023-07-05 DIAGNOSIS — C3490 Malignant neoplasm of unspecified part of unspecified bronchus or lung: Secondary | ICD-10-CM | POA: Diagnosis not present

## 2023-07-05 DIAGNOSIS — R339 Retention of urine, unspecified: Secondary | ICD-10-CM | POA: Diagnosis not present

## 2023-07-05 DIAGNOSIS — R627 Adult failure to thrive: Secondary | ICD-10-CM | POA: Diagnosis not present

## 2023-07-09 ENCOUNTER — Ambulatory Visit (INDEPENDENT_AMBULATORY_CARE_PROVIDER_SITE_OTHER): Admitting: Urology

## 2023-07-09 ENCOUNTER — Encounter: Payer: Self-pay | Admitting: Urology

## 2023-07-09 VITALS — BP 127/75 | HR 63

## 2023-07-09 DIAGNOSIS — N138 Other obstructive and reflux uropathy: Secondary | ICD-10-CM

## 2023-07-09 DIAGNOSIS — J441 Chronic obstructive pulmonary disease with (acute) exacerbation: Secondary | ICD-10-CM | POA: Diagnosis not present

## 2023-07-09 DIAGNOSIS — N401 Enlarged prostate with lower urinary tract symptoms: Secondary | ICD-10-CM

## 2023-07-09 DIAGNOSIS — R339 Retention of urine, unspecified: Secondary | ICD-10-CM

## 2023-07-09 DIAGNOSIS — R338 Other retention of urine: Secondary | ICD-10-CM | POA: Diagnosis not present

## 2023-07-09 DIAGNOSIS — J101 Influenza due to other identified influenza virus with other respiratory manifestations: Secondary | ICD-10-CM | POA: Diagnosis not present

## 2023-07-09 DIAGNOSIS — F0390 Unspecified dementia without behavioral disturbance: Secondary | ICD-10-CM | POA: Diagnosis not present

## 2023-07-09 DIAGNOSIS — G9341 Metabolic encephalopathy: Secondary | ICD-10-CM | POA: Diagnosis not present

## 2023-07-09 NOTE — Progress Notes (Signed)
 07/09/2023 9:58 AM   Derwood Flor [birth year] [id number]  Referring provider: [name], MD [address] [place],  [place] [address]  Urinary retention   HPI: Mr Scolaro is a 83yo here for followup for urinary retention. He was discharged with a foley in place. He was admitted with altered mental status. After foley was placed he developed gross hematuria which has since resolved. He has very poor mobility and is confined to a wheelchair   PMH: Past Medical History:  Diagnosis Date   Anxiety    Arthritis    Cancer (HCC)    Cirrhosis (HCC)    confirmed by MRI on 07/04/11.     COPD (chronic obstructive pulmonary disease) (HCC)    Depression with anxiety    ETOH abuse    Hyperlipidemia    Hypertension    diet control   Nodule of upper lobe of right lung    with mediastinal adenopathy   Wears dentures    Wears glasses     Surgical History: Past Surgical History:  Procedure Laterality Date   bilateral cataract surgery     [place]   broken arm  6 yrs ago   left otif of wrist-[place]   CLOSED REDUCTION WRIST FRACTURE Right 09/02/2015   Procedure: RIGHT WRIST REDUCTION;  Surgeon: Saundra Curl, MD;  Location: [facility] OR;  Service: Orthopedics;  Laterality: Right;  with MAC   COLONOSCOPY  1996   [facility]: external hemorrhoids, no polyps   COLONOSCOPY  06/28/2011   Dr. Rudolpho Costa diverticulosis,tubular adenoma, hyperplastic polyp   COLONOSCOPY WITH PROPOFOL  N/A 04/26/2017   Dr. Riley Cheadle: perianal and digital rectal examinations normal, diffusely congested colonic mucosa but otherwise normal   ESOPHAGOGASTRODUODENOSCOPY  06/28/2011   Dr. Cleora Daft erosive reflux esophagitis, hiatal hernia-gastritis   ESOPHAGOGASTRODUODENOSCOPY (EGD) WITH PROPOFOL  N/A 04/26/2017   Dr. Riley Cheadle: normal esophagus, small hiatal hernia, GAVE, portal hypertensive gastropathy, normal duodenal bulb and second portion, no specimens collected. 2 years screening    ESOPHAGOGASTRODUODENOSCOPY (EGD)  WITH PROPOFOL  N/A 06/19/2019   normal esophagus, portal gastropathy, GAVE, normal duodenum. 2 years screening.    EXTERNAL EAR SURGERY     right ear-cleaned out ear and created new eardrum   INGUINAL HERNIA REPAIR  2-3 yrs ago   left-[facility]   INTRAMEDULLARY (IM) NAIL INTERTROCHANTERIC Right 09/02/2015   Procedure: RIGHT  INTERTROCHANTRIC HIP;  Surgeon: Saundra Curl, MD;  Location: [facility] OR;  Service: Orthopedics;  Laterality: Right;  With MAC   KNEE SURGERY     left-arthroscopy-[place]   PORTACATH PLACEMENT Left 06/08/2017   Procedure: INSERTION POWER PORT WITH  ATTACHED CATHETER LEFT SUBCLAVIAN;  Surgeon: Alanda Allegra, MD;  Location: [facility];  Service: General;  Laterality: Left;   SKULL FRACTURE ELEVATION     fractured cheeck bone repaired   TOTAL HIP ARTHROPLASTY  12/18/2011   Procedure: TOTAL HIP ARTHROPLASTY;  Surgeon: Darrin Emerald, MD;  Location: [facility];  Service: Orthopedics;  Laterality: Left;   VIDEO BRONCHOSCOPY WITH ENDOBRONCHIAL ULTRASOUND N/A 05/04/2017   Procedure: VIDEO BRONCHOSCOPY WITH ENDOBRONCHIAL ULTRASOUND;  Surgeon: Zelphia Higashi, MD;  Location: [facility] OR;  Service: Thoracic;  Laterality: N/A;   WISDOM TOOTH EXTRACTION      Home Medications:  Allergies as of 07/09/2023   No Known Allergies      Medication List        Accurate as of Jul 09, 2023  9:58 AM. If you have any questions, ask your nurse or doctor.  acetaminophen  325 MG tablet Commonly known as: TYLENOL  Take 325 mg by mouth every 4 (four) hours as needed for mild pain (pain score 1-3).   albuterol  108 (90 Base) MCG/ACT inhaler Commonly known as: VENTOLIN  HFA Inhale 1 puff into the lungs 4 (four) times daily as needed. What changed: reasons to take this   Aloe Vera Gel Apply 1 Application topically in the morning, at noon, and at bedtime. Apply to nasal carriage   benzonatate  200 MG capsule Commonly known as: TESSALON  Take 200 mg by mouth 3 (three) times daily as  needed for cough.   carvedilol  3.125 MG tablet Commonly known as: COREG  Take 1 tablet (3.125 mg total) by mouth 2 (two) times daily with a meal.   cholecalciferol  25 MCG (1000 UNIT) tablet Commonly known as: VITAMIN D3 Take 1,000 Units by mouth daily.   CULTURELLE DIGESTIVE DAILY PO Take 1 capsule by mouth daily.   cyanocobalamin  1000 MCG tablet Commonly known as: VITAMIN B12 Take 1 tablet (1,000 mcg total) by mouth daily.   cycloSPORINE  0.05 % ophthalmic emulsion Commonly known as: RESTASIS  Place 1 drop into both eyes 2 (two) times daily.   desonide 0.05 % cream Commonly known as: DESOWEN Apply 1 Application topically 2 (two) times daily as needed (itching). Apply to face, scalp, and ears   diclofenac  Sodium 1 % Gel Commonly known as: VOLTAREN  Apply 2 g topically 4 (four) times daily as needed (pain). Apply to left lateral ankle   docusate sodium  100 MG capsule Commonly known as: COLACE Take 100 mg by mouth 2 (two) times daily.   donepezil  10 MG tablet Commonly known as: ARICEPT  TAKE 1 TABLET BY MOUTH AT BEDTIME.   FeroSul 325 (65 Fe) MG tablet Generic drug: ferrous sulfate Take 325 mg by mouth daily with breakfast.   GoodSense Artificial Tears 0.5-0.6 % Soln Generic drug: Polyvinyl Alcohol-Povidone Place 2 drops into both eyes every 8 (eight) hours as needed (dry eyes).   ipratropium-albuterol  0.5-2.5 (3) MG/3ML Soln Commonly known as: DUONEB Take 3 mLs by nebulization every 4 (four) hours as needed (wheezing, shortness of breath).   levothyroxine  75 MCG tablet Commonly known as: SYNTHROID  Take 1 tablet (75 mcg total) by mouth daily at 6 (six) AM.   melatonin 5 MG Tabs Take 5 mg by mouth at bedtime.   memantine  5 MG tablet Commonly known as: NAMENDA  Take 1 tablet (5 mg at night) for 2 weeks, then increase to 1 tablet (5 mg) twice a day What changed:  how much to take how to take this when to take this additional instructions   METAMUCIL PO Take 1  capsule by mouth in the morning and at bedtime.   Mucus Relief 600 MG 12 hr tablet Generic drug: guaiFENesin  Take 600 mg by mouth 2 (two) times daily as needed for cough.   OXYGEN  Inhale 2 L into the lungs continuous.   PARoxetine 20 MG tablet Commonly known as: PAXIL Take 20 mg by mouth daily.   rosuvastatin  10 MG tablet Commonly known as: CRESTOR  Take 1 tablet (10 mg total) by mouth daily.   tamsulosin  0.4 MG Caps capsule Commonly known as: FLOMAX  Take 1 capsule (0.4 mg total) by mouth daily after supper.   Trelegy Ellipta 200-62.5-25 MCG/ACT Aepb Generic drug: Fluticasone -Umeclidin-Vilant Inhale 1 puff into the lungs daily. Rinse mouth after use        Allergies: No Known Allergies  Family History: Family History  Problem Relation Age of Onset   Stroke Mother 38  Heart disease Father 45       MI   Diabetes Maternal Uncle    Colon cancer Neg Hx    Cancer Neg Hx     Social History:  reports that he quit smoking about 66 years ago. His smoking use included cigarettes. He has never used smokeless tobacco. He reports that he does not currently use alcohol after a past usage of about 2.0 standard drinks of alcohol per week. He reports that he does not use drugs.  ROS: All other review of systems were reviewed and are negative except what is noted above in HPI  Physical Exam: BP 127/75   Pulse 63   Constitutional:  Alert and oriented, No acute distress. HEENT: [place] AT, moist mucus membranes.  Trachea midline, no masses. Cardiovascular: No clubbing, cyanosis, or edema. Respiratory: Normal respiratory effort, no increased work of breathing. GI: Abdomen is soft, nontender, nondistended, no abdominal masses GU: No CVA tenderness.  Lymph: No cervical or inguinal lymphadenopathy. Skin: No rashes, bruises or suspicious lesions. Neurologic: Grossly intact, no focal deficits, moving all 4 extremities. Psychiatric: Normal mood and affect.  Laboratory Data: Lab Results   Component Value Date   WBC 7.7 06/29/2023   HGB 12.2 (L) 06/29/2023   HCT 36.7 (L) 06/29/2023   MCV 92.7 06/29/2023   PLT 229 06/29/2023    Lab Results  Component Value Date   CREATININE 0.76 07/01/2023    No results found for: "PSA"  No results found for: "TESTOSTERONE"  No results found for: "HGBA1C"  Urinalysis    Component Value Date/Time   COLORURINE YELLOW 06/28/2023 2100   APPEARANCEUR CLEAR 06/28/2023 2100   LABSPEC >1.046 (H) 06/28/2023 2100   PHURINE 5.0 06/28/2023 2100   GLUCOSEU NEGATIVE 06/28/2023 2100   HGBUR NEGATIVE 06/28/2023 2100   BILIRUBINUR NEGATIVE 06/28/2023 2100   KETONESUR 20 (A) 06/28/2023 2100   PROTEINUR 100 (A) 06/28/2023 2100   NITRITE NEGATIVE 06/28/2023 2100   LEUKOCYTESUR NEGATIVE 06/28/2023 2100    Lab Results  Component Value Date   BACTERIA NONE SEEN 06/28/2023    Pertinent Imaging:  No results found for this or any previous visit.  No results found for this or any previous visit.  No results found for this or any previous visit.  No results found for this or any previous visit.  No results found for this or any previous visit.  No results found for this or any previous visit.  No results found for this or any previous visit.  No results found for this or any previous visit.   Assessment & Plan:    1. Urinary retention (Primary) -We discussed the management of his BPH including continued medical therapy, indwelling foley, Rezum, Urolift, TURP and simple prostatectomy. After discussing the options the patient has elected to proceed with indwelling foley. Risks/benefits/alternatives discussed.   2. Benign prostatic hyperplasia with urinary obstruction -continue monthly foley changes   No follow-ups on file.  Johnie Nailer, MD  [facility] [place]

## 2023-07-09 NOTE — Patient Instructions (Signed)

## 2023-07-10 NOTE — Progress Notes (Signed)
 [facility] Liaison Note  07/10/2023  Ian Aguilar [birth year] [id number]  Location: RN Hospital Liaison screened the patient remotely at [facility].  Insurance: [employer]   Ian Aguilar is a 83 y.o. male who is a Primary Care Patient of [name], MD [facility]. The patient was screened for readmission hospitalization with noted high risk score for unplanned readmission risk with 1 IP/3 ED in 6 months.  Review/ screened patient's electronic medical record reveals patient Mental Status changes. Pt transitioned to [facility] for STR. This facility will continue to address pt's needs.   [facility] Care Management/Population Health does not replace or interfere with any arrangements made by the Inpatient Transition of Care team.   For questions contact:   Lilla Reichert, RN, BSN Hospital Liaison [place]   [facility], Population Health Office Hours MTWF  8:00 am-6:00 pm Direct Dial: [phone] mobile [email or web address]

## 2023-07-11 DIAGNOSIS — J441 Chronic obstructive pulmonary disease with (acute) exacerbation: Secondary | ICD-10-CM | POA: Diagnosis not present

## 2023-07-11 DIAGNOSIS — E559 Vitamin D deficiency, unspecified: Secondary | ICD-10-CM | POA: Diagnosis not present

## 2023-07-11 DIAGNOSIS — E039 Hypothyroidism, unspecified: Secondary | ICD-10-CM | POA: Diagnosis not present

## 2023-07-11 DIAGNOSIS — D649 Anemia, unspecified: Secondary | ICD-10-CM | POA: Diagnosis not present

## 2023-07-12 ENCOUNTER — Telehealth: Payer: Self-pay | Admitting: Urology

## 2023-07-12 NOTE — Telephone Encounter (Signed)
 Patient son called he thought Dr Claretta Croft said to double the patients Flomax ? [employer] needs that in writing and sent over to them , also needs to have prescriptions changed and sent as well .   Please call to advise.

## 2023-07-13 MED ORDER — TAMSULOSIN HCL 0.4 MG PO CAPS: 0.4000 mg | ORAL_CAPSULE | Freq: Two times a day (BID) | ORAL | 11 refills | Status: DC

## 2023-07-13 NOTE — Addendum Note (Signed)
 Addended by: Jayana Kotula L on: 07/13/2023 08:27 AM   Modules accepted: Orders

## 2023-07-13 NOTE — Telephone Encounter (Signed)
 Kayleen Party from [employer] [facility] called script needs sent to [facility] they fill their own RX until patient is release to go home.

## 2023-07-13 NOTE — Telephone Encounter (Signed)
 Orders faxed to [facility] rehab this morning.

## 2023-07-17 DIAGNOSIS — X32XXXD Exposure to sunlight, subsequent encounter: Secondary | ICD-10-CM | POA: Diagnosis not present

## 2023-07-17 DIAGNOSIS — D649 Anemia, unspecified: Secondary | ICD-10-CM | POA: Diagnosis not present

## 2023-07-17 DIAGNOSIS — J441 Chronic obstructive pulmonary disease with (acute) exacerbation: Secondary | ICD-10-CM | POA: Diagnosis not present

## 2023-07-17 DIAGNOSIS — Z08 Encounter for follow-up examination after completed treatment for malignant neoplasm: Secondary | ICD-10-CM | POA: Diagnosis not present

## 2023-07-17 DIAGNOSIS — M6281 Muscle weakness (generalized): Secondary | ICD-10-CM | POA: Diagnosis not present

## 2023-07-17 DIAGNOSIS — Z85828 Personal history of other malignant neoplasm of skin: Secondary | ICD-10-CM | POA: Diagnosis not present

## 2023-07-17 DIAGNOSIS — L570 Actinic keratosis: Secondary | ICD-10-CM | POA: Diagnosis not present

## 2023-07-18 DIAGNOSIS — J441 Chronic obstructive pulmonary disease with (acute) exacerbation: Secondary | ICD-10-CM | POA: Diagnosis not present

## 2023-07-25 DIAGNOSIS — F0390 Unspecified dementia without behavioral disturbance: Secondary | ICD-10-CM | POA: Diagnosis not present

## 2023-07-25 DIAGNOSIS — R339 Retention of urine, unspecified: Secondary | ICD-10-CM | POA: Diagnosis not present

## 2023-07-25 DIAGNOSIS — J441 Chronic obstructive pulmonary disease with (acute) exacerbation: Secondary | ICD-10-CM | POA: Diagnosis not present

## 2023-07-25 DIAGNOSIS — N390 Urinary tract infection, site not specified: Secondary | ICD-10-CM | POA: Diagnosis not present

## 2023-07-31 DIAGNOSIS — E039 Hypothyroidism, unspecified: Secondary | ICD-10-CM | POA: Diagnosis not present

## 2023-08-01 ENCOUNTER — Inpatient Hospital Stay: Payer: PPO | Attending: Hematology

## 2023-08-01 ENCOUNTER — Ambulatory Visit (HOSPITAL_COMMUNITY)
Admission: RE | Admit: 2023-08-01 | Discharge: 2023-08-01 | Disposition: A | Discharge status: Home / Self Care | Payer: PPO | Source: Ambulatory Visit | Attending: Hematology | Admitting: Hematology

## 2023-08-01 ENCOUNTER — Ambulatory Visit: Payer: Self-pay | Admitting: *Deleted

## 2023-08-01 DIAGNOSIS — E039 Hypothyroidism, unspecified: Secondary | ICD-10-CM | POA: Insufficient documentation

## 2023-08-01 DIAGNOSIS — C3411 Malignant neoplasm of upper lobe, right bronchus or lung: Secondary | ICD-10-CM | POA: Diagnosis present

## 2023-08-01 DIAGNOSIS — C3491 Malignant neoplasm of unspecified part of right bronchus or lung: Secondary | ICD-10-CM

## 2023-08-01 DIAGNOSIS — J984 Other disorders of lung: Secondary | ICD-10-CM | POA: Diagnosis not present

## 2023-08-01 DIAGNOSIS — Z87891 Personal history of nicotine dependence: Secondary | ICD-10-CM | POA: Insufficient documentation

## 2023-08-01 DIAGNOSIS — D509 Iron deficiency anemia, unspecified: Secondary | ICD-10-CM | POA: Diagnosis not present

## 2023-08-01 DIAGNOSIS — I700 Atherosclerosis of aorta: Secondary | ICD-10-CM | POA: Diagnosis not present

## 2023-08-01 LAB — CBC WITH DIFFERENTIAL/PLATELET
Abs Immature Granulocytes: 0.04 10*3/uL (ref 0.00–0.07)
Basophils Absolute: 0 10*3/uL (ref 0.0–0.1)
Basophils Relative: 0 %
Eosinophils Absolute: 0.1 10*3/uL (ref 0.0–0.5)
Eosinophils Relative: 1 %
HCT: 36.4 % — ABNORMAL LOW (ref 39.0–52.0)
Hemoglobin: 11.8 g/dL — ABNORMAL LOW (ref 13.0–17.0)
Immature Granulocytes: 1 %
Lymphocytes Relative: 8 %
Lymphs Abs: 0.6 10*3/uL — ABNORMAL LOW (ref 0.7–4.0)
MCH: 30.5 pg (ref 26.0–34.0)
MCHC: 32.4 g/dL (ref 30.0–36.0)
MCV: 94.1 fL (ref 80.0–100.0)
Monocytes Absolute: 0.5 10*3/uL (ref 0.1–1.0)
Monocytes Relative: 7 %
Neutro Abs: 5.9 10*3/uL (ref 1.7–7.7)
Neutrophils Relative %: 83 %
Platelets: 237 10*3/uL (ref 150–400)
RBC: 3.87 MIL/uL — ABNORMAL LOW (ref 4.22–5.81)
RDW: 15.1 % (ref 11.5–15.5)
WBC: 7.2 10*3/uL (ref 4.0–10.5)
nRBC: 0 % (ref 0.0–0.2)

## 2023-08-01 LAB — COMPREHENSIVE METABOLIC PANEL WITH GFR
ALT: 24 U/L (ref 0–44)
AST: 22 U/L (ref 15–41)
Albumin: 3.2 g/dL — ABNORMAL LOW (ref 3.5–5.0)
Alkaline Phosphatase: 68 U/L (ref 38–126)
Anion gap: 8 (ref 5–15)
BUN: 24 mg/dL — ABNORMAL HIGH (ref 8–23)
CO2: 34 mmol/L — ABNORMAL HIGH (ref 22–32)
Calcium: 8.6 mg/dL — ABNORMAL LOW (ref 8.9–10.3)
Chloride: 90 mmol/L — ABNORMAL LOW (ref 98–111)
Creatinine, Ser: 0.84 mg/dL (ref 0.61–1.24)
GFR, Estimated: >60 mL/min (ref >60)
Glucose, Bld: 103 mg/dL — ABNORMAL HIGH (ref 70–99)
Potassium: 3.9 mmol/L (ref 3.5–5.1)
Sodium: 132 mmol/L — ABNORMAL LOW (ref 135–145)
Total Bilirubin: 1 mg/dL (ref 0.0–1.2)
Total Protein: 6.3 g/dL — ABNORMAL LOW (ref 6.5–8.1)

## 2023-08-01 LAB — IRON AND TIBC
Iron: 38 ug/dL — ABNORMAL LOW (ref 45–182)
Saturation Ratios: 15 % — ABNORMAL LOW (ref 17.9–39.5)
TIBC: 247 ug/dL — ABNORMAL LOW (ref 250–450)
UIBC: 209 ug/dL

## 2023-08-01 LAB — FERRITIN: Ferritin: 65 ng/mL (ref 24–336)

## 2023-08-01 LAB — TSH: TSH: 71.424 u[IU]/mL — ABNORMAL HIGH (ref 0.350–4.500)

## 2023-08-01 MED ORDER — SODIUM CHLORIDE 0.9% FLUSH
10.0000 mL | INTRAVENOUS | Status: DC | PRN
  Administered 2023-08-01: 10 mL via INTRAVENOUS

## 2023-08-01 MED ORDER — IOHEXOL 300 MG/ML  SOLN
75.0000 mL | Freq: Once | INTRAMUSCULAR | Status: AC | PRN
  Administered 2023-08-01: 75 mL via INTRAVENOUS

## 2023-08-01 NOTE — Progress Notes (Signed)
 Ian Aguilar presented for Portacath access and flush with labs. Portacath located left chest wall accessed with  H 20 needle.  Good blood return present. Patient will remain accessed for CT scan.  Discharged from clinic via wheelchair in stable condition. Alert and oriented x 3. F/U with [facility] as scheduled.

## 2023-08-01 NOTE — Progress Notes (Signed)
 Dr. Cheree Cords notified of elevated TSH.  Results faxed to Dr. Glady Laming and office called.

## 2023-08-02 DIAGNOSIS — M6281 Muscle weakness (generalized): Secondary | ICD-10-CM | POA: Diagnosis not present

## 2023-08-02 DIAGNOSIS — Z7409 Other reduced mobility: Secondary | ICD-10-CM | POA: Diagnosis not present

## 2023-08-02 DIAGNOSIS — R634 Abnormal weight loss: Secondary | ICD-10-CM | POA: Diagnosis not present

## 2023-08-02 DIAGNOSIS — J449 Chronic obstructive pulmonary disease, unspecified: Secondary | ICD-10-CM | POA: Diagnosis not present

## 2023-08-02 DIAGNOSIS — Z9981 Dependence on supplemental oxygen: Secondary | ICD-10-CM | POA: Diagnosis not present

## 2023-08-02 DIAGNOSIS — R52 Pain, unspecified: Secondary | ICD-10-CM | POA: Diagnosis not present

## 2023-08-02 DIAGNOSIS — Z515 Encounter for palliative care: Secondary | ICD-10-CM | POA: Diagnosis not present

## 2023-08-02 DIAGNOSIS — F419 Anxiety disorder, unspecified: Secondary | ICD-10-CM | POA: Diagnosis not present

## 2023-08-07 DIAGNOSIS — F0390 Unspecified dementia without behavioral disturbance: Secondary | ICD-10-CM | POA: Diagnosis not present

## 2023-08-07 DIAGNOSIS — M6281 Muscle weakness (generalized): Secondary | ICD-10-CM | POA: Diagnosis not present

## 2023-08-07 DIAGNOSIS — D649 Anemia, unspecified: Secondary | ICD-10-CM | POA: Diagnosis not present

## 2023-08-07 DIAGNOSIS — E039 Hypothyroidism, unspecified: Secondary | ICD-10-CM | POA: Diagnosis not present

## 2023-08-07 DIAGNOSIS — J441 Chronic obstructive pulmonary disease with (acute) exacerbation: Secondary | ICD-10-CM | POA: Diagnosis not present

## 2023-08-08 ENCOUNTER — Inpatient Hospital Stay: Payer: PPO | Admitting: Hematology

## 2023-08-08 ENCOUNTER — Inpatient Hospital Stay (HOSPITAL_BASED_OUTPATIENT_CLINIC_OR_DEPARTMENT_OTHER): Admitting: Hematology

## 2023-08-08 VITALS — BP 94/57 | HR 64 | Temp 98.0°F | Resp 20

## 2023-08-08 DIAGNOSIS — C3490 Malignant neoplasm of unspecified part of unspecified bronchus or lung: Secondary | ICD-10-CM | POA: Diagnosis not present

## 2023-08-08 DIAGNOSIS — C3411 Malignant neoplasm of upper lobe, right bronchus or lung: Secondary | ICD-10-CM | POA: Diagnosis not present

## 2023-08-08 NOTE — Patient Instructions (Addendum)
 [facility] at [facility] Discharge Instructions   You were seen and examined today by Dr. Cheree Cords.  He reviewed the results of your lab work which are normal/stable.   He reviewed the results of you CT scan. It is showing one of the spots in the lung has grown.   We will need to obtain a PET scan to evaluate the growth of this spot in the lung further and see if there are any areas of cancer in other parts of the body.   We will see you back after the PET scan to review the results.   Return as scheduled.    Thank you for choosing [facility] at [facility] to provide your oncology and hematology care.  To afford each patient quality time with our provider, please arrive at least 15 minutes before your scheduled appointment time.   If you have a lab appointment with the Cancer Center please come in thru the Main Entrance and check in at the main information desk.  You need to re-schedule your appointment should you arrive 10 or more minutes late.  We strive to give you quality time with our providers, and arriving late affects you and other patients whose appointments are after yours.  Also, if you no show three or more times for appointments you may be dismissed from the clinic at the providers discretion.     Again, thank you for choosing [facility].  Our hope is that these requests will decrease the amount of time that you wait before being seen by our physicians.       _____________________________________________________________  Should you have questions after your visit to [facility], please contact our office at [phone] and follow the prompts.  Our office hours are 8:00 a.m. and 4:30 p.m. Monday - Friday.  Please note that voicemails left after 4:00 p.m. may not be returned until the following business day.  We are closed weekends and major holidays.  You do have access to a nurse 24-7, just call the  main number to the clinic [phone] and do not press any options, hold on the line and a nurse will answer the phone.    For prescription refill requests, have your pharmacy contact our office and allow 72 hours.    Due to Covid, you will need to wear a mask upon entering the hospital. If you do not have a mask, a mask will be given to you at the Main Entrance upon arrival. For doctor visits, patients may have 1 support person age 58 or older with them. For treatment visits, patients can not have anyone with them due to social distancing guidelines and our immunocompromised population.

## 2023-08-08 NOTE — Progress Notes (Signed)
 [facility] [address] [place] [address]    Clinic Day:  08/08/2023  Referring physician: [name], MD  Patient Care Team: [name], MD as PCP - General (Family Medicine) Riley Cheadle Windsor Hatcher, MD (Gastroenterology) Alane Allen Lafaye Pierini (Neurology)   ASSESSMENT & PLAN:   Assessment: 1.  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 -Durvalumab  consolidation from 09/19/2017 through 09/17/2018. -Denies any cough or hemoptysis. -I reviewed CT chest with contrast from 06/16/2019 which showed 9 mm nodule in the medial right upper lobe unchanged.  Adjacent radiation changes.  No evidence of progression or metastatic disease. -CT chest on 12/16/2019 showed stable radiation changes and small nodule in the medial right upper lobe with no signs of recurrence.  New bandlike area of septal thickening and micro nodularity in the right lateral and left anterior upper chest, most suggestive of sequela of infection/inflammation.  Signs of hepatic cirrhosis.    Plan: 1.  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 assisted living facility after stroke. - He was admitted to the hospital from 06/28/2023 through 07/04/2023 with acute mental status changes.  He had urinary retention and catheter was placed.  Hence he is at [facility] rehab at this time. - Reviewed CT chest from 08/01/2023: Mixed density medial right lower lobe nodule measuring 5.6 x 2.6 cm with progressive 2.9 cm solid component posteriorly suspicious for adenocarcinoma.  Stable medial right upper lobe subpleural nodule with radiation changes. - Based on these findings, I have recommended PET CT scan for further evaluation and follow-up after that.   2.  Hypothyroidism: - Latest TSH is 71.  He is on Synthroid  75 mcg daily..  I will address it at next visit.  3.  Normocytic  anemia: - He is taking iron tablet daily.  Hemoglobin is 11.8.  Ferritin is 65, up from 58 before.  Saturation is 15.  Continue iron tablet daily.    Orders Placed This Encounter  Procedures   NM PET Image Restage (PS) Skull Base to Thigh (F-18 FDG)    Standing Status:   Future    Expected Date:   08/22/2023    Expiration Date:   08/07/2024    If indicated for the ordered procedure, I authorize the administration of a radiopharmaceutical per Radiology protocol:   Yes    Preferred imaging location?:   [facility]      Paulett Boros, MD   6/11/202512:32 PM  CHIEF COMPLAINT:   Diagnosis: stage II right upper lobe adenocarcinoma    Cancer Staging  No matching staging information was found for the patient.    Prior Therapy: 1. Chemoradiation with carboplatin  and paclitaxel  weekly from 06/11/2017 to 07/16/2017. 2. Consolidation with durvalumab  from 09/19/2017 to 09/17/2018.  Current Therapy:  surveillance    HISTORY OF PRESENT ILLNESS:   Oncology History  Carcinoma, lung (HCC)  06/05/2017 Initial Diagnosis   Carcinoma, lung (HCC)   06/11/2017 - 07/16/2017 Chemotherapy   The patient had dexamethasone  (DECADRON ) 4 MG tablet, 8 mg, Oral, Daily, 1 of 1 cycle, Start date: --, End date: -- palonosetron  (ALOXI ) injection 0.25 mg, 0.25 mg, Intravenous,  Once, 6 of 6 cycles Administration: 0.25 mg (06/11/2017), 0.25 mg (07/09/2017), 0.25 mg (07/16/2017), 0.25 mg (06/18/2017), 0.25 mg (06/25/2017), 0.25 mg (07/02/2017) CARBOplatin  (PARAPLATIN ) 190 mg in sodium chloride  0.9 % 250 mL chemo infusion, 190 mg (100 % of  original dose 192.8 mg), Intravenous,  Once, 6 of 6 cycles Dose modification:   (original dose 192.8 mg, Cycle 1),   (original dose 192.8 mg, Cycle 5), 144.6 mg (original dose 144.6 mg, Cycle 6),   (original dose 192.8 mg, Cycle 2),   (original dose 192.8 mg, Cycle 3),   (original dose 192.8 mg, Cycle 4) Administration: 190 mg (06/11/2017), 190 mg (07/09/2017), 140 mg (07/16/2017), 190  mg (06/18/2017), 190 mg (06/25/2017), 190 mg (07/02/2017) PACLitaxel  (TAXOL ) 90 mg in dextrose  5 % 250 mL chemo infusion (</= 80mg /m2), 45 mg/m2 = 90 mg, Intravenous,  Once, 6 of 6 cycles Dose modification: 40.5 mg/m2 (90 % of original dose 45 mg/m2, Cycle 5, Reason: Other (see comments), Comment: early neuropathy), 36 mg/m2 (80 % of original dose 45 mg/m2, Cycle 6, Reason: Provider Judgment) Administration: 90 mg (06/11/2017), 78 mg (07/09/2017), 72 mg (07/16/2017), 90 mg (06/18/2017), 78 mg (06/25/2017), 78 mg (07/02/2017)  for chemotherapy treatment.    Malignant neoplasm of bronchus and lung (HCC)  06/08/2017 Initial Diagnosis   Malignant neoplasm of bronchus and lung (HCC)   09/20/2017 - 09/17/2018 Chemotherapy   Patient is on Treatment Plan : LUNG DURVALUMAB  Q14D        INTERVAL HISTORY:   Ian Aguilar is a 83 y.o. male seen for follow-up of right lung adenocarcinoma and iron deficiency anemia.  He is currently residing at [facility] rehab secondary to presence of urinary catheter.  He was admitted to the hospital in May for acute mental status changes when he had urinary retention and required catheter placement.  PAST MEDICAL HISTORY:   Past Medical History: Past Medical History:  Diagnosis Date   Anxiety    Arthritis    Cancer (HCC)    Cirrhosis (HCC)    confirmed by MRI on 07/04/11.     COPD (chronic obstructive pulmonary disease) (HCC)    Depression with anxiety    ETOH abuse    Hyperlipidemia    Hypertension    diet control   Nodule of upper lobe of right lung    with mediastinal adenopathy   Wears dentures    Wears glasses     Surgical History: Past Surgical History:  Procedure Laterality Date   bilateral cataract surgery     [place]   broken arm  6 yrs ago   left otif of wrist-[place]   CLOSED REDUCTION WRIST FRACTURE Right 09/02/2015   Procedure: RIGHT WRIST REDUCTION;  Surgeon: Saundra Curl, MD;  Location: [facility] OR;  Service: Orthopedics;  Laterality: Right;  with MAC    COLONOSCOPY  1996   [facility]: external hemorrhoids, no polyps   COLONOSCOPY  06/28/2011   Dr. Rudolpho Costa diverticulosis,tubular adenoma, hyperplastic polyp   COLONOSCOPY WITH PROPOFOL  N/A 04/26/2017   Dr. Riley Cheadle: perianal and digital rectal examinations normal, diffusely congested colonic mucosa but otherwise normal   ESOPHAGOGASTRODUODENOSCOPY  06/28/2011   Dr. Cleora Daft erosive reflux esophagitis, hiatal hernia-gastritis   ESOPHAGOGASTRODUODENOSCOPY (EGD) WITH PROPOFOL  N/A 04/26/2017   Dr. Riley Cheadle: normal esophagus, small hiatal hernia, GAVE, portal hypertensive gastropathy, normal duodenal bulb and second portion, no specimens collected. 2 years screening    ESOPHAGOGASTRODUODENOSCOPY (EGD) WITH PROPOFOL  N/A 06/19/2019   normal esophagus, portal gastropathy, GAVE, normal duodenum. 2 years screening.    EXTERNAL EAR SURGERY     right ear-cleaned out ear and created new eardrum   INGUINAL HERNIA REPAIR  2-3 yrs ago   left-[place]   INTRAMEDULLARY (IM) NAIL INTERTROCHANTERIC Right 09/02/2015   Procedure: RIGHT  INTERTROCHANTRIC HIP;  Surgeon: Saundra Curl, MD;  Location: [facility] OR;  Service: Orthopedics;  Laterality: Right;  With MAC   KNEE SURGERY     left-arthroscopy-[place]   PORTACATH PLACEMENT Left 06/08/2017   Procedure: INSERTION POWER PORT WITH  ATTACHED CATHETER LEFT SUBCLAVIAN;  Surgeon: Alanda Allegra, MD;  Location: [facility] ORS;  Service: General;  Laterality: Left;   SKULL FRACTURE ELEVATION     fractured cheeck bone repaired   TOTAL HIP ARTHROPLASTY  12/18/2011   Procedure: TOTAL HIP ARTHROPLASTY;  Surgeon: Darrin Emerald, MD;  Location: [facility] ORS;  Service: Orthopedics;  Laterality: Left;   VIDEO BRONCHOSCOPY WITH ENDOBRONCHIAL ULTRASOUND N/A 05/04/2017   Procedure: VIDEO BRONCHOSCOPY WITH ENDOBRONCHIAL ULTRASOUND;  Surgeon: Zelphia Higashi, MD;  Location: [facility] OR;  Service: Thoracic;  Laterality: N/A;   WISDOM TOOTH EXTRACTION      Social History: Social History    Socioeconomic History   Marital status: Widowed    Spouse name: Not on file   Number of children: 2   Years of education: 25   Highest education level: Not on file  Occupational History   Occupation: retired    Associate Professor: [employer]  Tobacco Use   Smoking status: Former    Current packs/day: 0.00    Types: Cigarettes    Quit date: 02/27/1957    Years since quitting: 66.4   Smokeless tobacco: Never   Tobacco comments:    1/2 ppd > 60 years as of 06/27/17: 4-5 cigarettes or less a day  Vaping Use   Vaping status: Never Used  Substance and Sexual Activity   Alcohol use: Not Currently    Alcohol/week: 2.0 standard drinks of alcohol    Types: 2 Cans of beer per week   Drug use: No   Sexual activity: Yes    Birth control/protection: None  Other Topics Concern   Not on file  Social History Narrative   Right handed   Drinks caffeine   Assistant living   Retired   Has one son and daughter    Social Drivers of Corporate investment banker Strain: Not on file  Food Insecurity: Patient Unable To Answer (06/29/2023)   Hunger Vital Sign    Worried About Running Out of Food in the Last Year: Patient unable to answer    Ran Out of Food in the Last Year: Patient unable to answer  Transportation Needs: Patient Unable To Answer (06/29/2023)   PRAPARE - Transportation    Lack of Transportation (Medical): Patient unable to answer    Lack of Transportation (Non-Medical): Patient unable to answer  Physical Activity: Not on file  Stress: Not on file  Social Connections: Patient Unable To Answer (06/29/2023)   Social Connection and Isolation Panel [NHANES]    Frequency of Communication with Friends and Family: Patient unable to answer    Frequency of Social Gatherings with Friends and Family: Patient unable to answer    Attends Religious Services: Patient unable to answer    Active Member of Clubs or Organizations: Patient unable to answer    Attends Banker Meetings:  Patient unable to answer    Marital Status: Patient unable to answer  Recent Concern: Social Connections - Socially Isolated (05/08/2023)   Social Connection and Isolation Panel [NHANES]    Frequency of Communication with Friends and Family: Twice a week    Frequency of Social Gatherings with Friends and Family: Twice a week    Attends Religious Services: Never  Active Member of Clubs or Organizations: No    Attends Banker Meetings: Never    Marital Status: Widowed  Intimate Partner Violence: Patient Unable To Answer (06/29/2023)   Humiliation, Afraid, Rape, and Kick questionnaire    Fear of Current or Ex-Partner: Patient unable to answer    Emotionally Abused: Patient unable to answer    Physically Abused: Patient unable to answer    Sexually Abused: Patient unable to answer    Family History: Family History  Problem Relation Age of Onset   Stroke Mother 90   Heart disease Father 19       MI   Diabetes Maternal Uncle    Colon cancer Neg Hx    Cancer Neg Hx     Current Medications:  Current Outpatient Medications:    acetaminophen  (TYLENOL ) 325 MG tablet, Take 325 mg by mouth every 4 (four) hours as needed for mild pain (pain score 1-3)., Disp: , Rfl:    albuterol  (VENTOLIN  HFA) 108 (90 Base) MCG/ACT inhaler, Inhale 1 puff into the lungs 4 (four) times daily as needed. (Patient taking differently: Inhale 1 puff into the lungs 4 (four) times daily as needed for shortness of breath or wheezing.), Disp: 8 g, Rfl: 2   Aloe Vera GEL, Apply 1 Application topically in the morning, at noon, and at bedtime. Apply to nasal carriage, Disp: , Rfl:    benzonatate  (TESSALON ) 200 MG capsule, Take 200 mg by mouth 3 (three) times daily as needed for cough., Disp: , Rfl:    carvedilol  (COREG ) 3.125 MG tablet, Take 1 tablet (3.125 mg total) by mouth 2 (two) times daily with a meal., Disp: , Rfl:    cholecalciferol  (VITAMIN D ) 1000 units tablet, Take 1,000 Units by mouth daily., Disp: ,  Rfl:    cycloSPORINE  (RESTASIS ) 0.05 % ophthalmic emulsion, Place 1 drop into both eyes 2 (two) times daily., Disp: , Rfl:    desonide (DESOWEN) 0.05 % cream, Apply 1 Application topically 2 (two) times daily as needed (itching). Apply to face, scalp, and ears, Disp: , Rfl:    diclofenac  Sodium (VOLTAREN ) 1 % GEL, Apply 2 g topically 4 (four) times daily as needed (pain). Apply to left lateral ankle, Disp: , Rfl:    docusate sodium  (COLACE) 100 MG capsule, Take 100 mg by mouth 2 (two) times daily., Disp: , Rfl:    donepezil  (ARICEPT ) 10 MG tablet, TAKE 1 TABLET BY MOUTH AT BEDTIME., Disp: 30 tablet, Rfl: 4   FEROSUL 325 (65 Fe) MG tablet, Take 325 mg by mouth daily with breakfast., Disp: , Rfl:    GOODSENSE ARTIFICIAL TEARS 0.5-0.6 % SOLN, Place 2 drops into both eyes every 8 (eight) hours as needed (dry eyes)., Disp: , Rfl:    ipratropium-albuterol  (DUONEB) 0.5-2.5 (3) MG/3ML SOLN, Take 3 mLs by nebulization every 4 (four) hours as needed (wheezing, shortness of breath)., Disp: 360 mL, Rfl: 1   Lactobacillus-Inulin (CULTURELLE DIGESTIVE DAILY PO), Take 1 capsule by mouth daily., Disp: , Rfl:    levothyroxine  (SYNTHROID ) 75 MCG tablet, Take 1 tablet (75 mcg total) by mouth daily at 6 (six) AM., Disp: , Rfl:    melatonin 5 MG TABS, Take 5 mg by mouth at bedtime., Disp: , Rfl:    memantine  (NAMENDA ) 5 MG tablet, Take 1 tablet (5 mg at night) for 2 weeks, then increase to 1 tablet (5 mg) twice a day (Patient taking differently: Take 5 mg by mouth 2 (two) times daily.), Disp: 60 tablet,  Rfl: 11   MUCUS RELIEF 600 MG 12 hr tablet, Take 600 mg by mouth 2 (two) times daily as needed for cough., Disp: , Rfl:    OXYGEN , Inhale 2 L into the lungs continuous., Disp: , Rfl:    PARoxetine (PAXIL) 20 MG tablet, Take 20 mg by mouth daily., Disp: , Rfl:    Psyllium (METAMUCIL PO), Take 1 capsule by mouth in the morning and at bedtime., Disp: , Rfl:    rosuvastatin  (CRESTOR ) 10 MG tablet, Take 1 tablet (10 mg total)  by mouth daily., Disp: 30 tablet, Rfl: 0   tamsulosin  (FLOMAX ) 0.4 MG CAPS capsule, Take 1 capsule (0.4 mg total) by mouth 2 (two) times daily., Disp: 60 capsule, Rfl: 11   TRELEGY ELLIPTA 200-62.5-25 MCG/ACT AEPB, Inhale 1 puff into the lungs daily. Rinse mouth after use, Disp: , Rfl:    vitamin B-12 (CYANOCOBALAMIN ) 1000 MCG tablet, Take 1 tablet (1,000 mcg total) by mouth daily., Disp: 30 tablet, Rfl: 6   Allergies: No Known Allergies  REVIEW OF SYSTEMS:   Review of Systems  Constitutional:  Negative for chills, fatigue and fever.  HENT:   Positive for trouble swallowing. Negative for lump/mass, mouth sores, nosebleeds and sore throat.   Eyes:  Negative for eye problems.  Respiratory:  Positive for shortness of breath.   Cardiovascular:  Negative for chest pain, leg swelling and palpitations.  Gastrointestinal:  Negative for abdominal pain, constipation, diarrhea, nausea and vomiting.  Genitourinary:  Negative for bladder incontinence, difficulty urinating, dysuria, frequency, hematuria and nocturia.   Musculoskeletal:  Negative for arthralgias, back pain, flank pain, myalgias and neck pain.  Skin:  Negative for itching and rash.  Neurological:  Negative for dizziness, headaches and numbness.  Hematological:  Does not bruise/bleed easily.  Psychiatric/Behavioral:  Negative for depression, sleep disturbance and suicidal ideas. The patient is not nervous/anxious.   All other systems reviewed and are negative.    VITALS:   Blood pressure (!) 94/57, pulse 64, temperature 98 F (36.7 C), temperature source Oral, resp. rate 20, SpO2 90%.  Wt Readings from Last 3 Encounters:  07/03/23 154 lb 12.2 oz (70.2 kg)  06/27/23 155 lb (70.3 kg)  06/20/23 155 lb 3.3 oz (70.4 kg)    There is no height or weight on file to calculate BMI.  Performance status (ECOG): 1 - Symptomatic but completely ambulatory  PHYSICAL EXAM:   Physical Exam Vitals and nursing note reviewed. Exam conducted with  a chaperone present.  Constitutional:      Appearance: Normal appearance.  Cardiovascular:     Rate and Rhythm: Normal rate and regular rhythm.     Pulses: Normal pulses.     Heart sounds: Normal heart sounds.  Pulmonary:     Effort: Pulmonary effort is normal.     Breath sounds: Normal breath sounds.  Abdominal:     Palpations: Abdomen is soft. There is no hepatomegaly, splenomegaly or mass.     Tenderness: There is no abdominal tenderness.  Musculoskeletal:     Right lower leg: No edema.     Left lower leg: No edema.  Lymphadenopathy:     Cervical: No cervical adenopathy.     Right cervical: No superficial, deep or posterior cervical adenopathy.    Left cervical: No superficial, deep or posterior cervical adenopathy.     Upper Body:     Right upper body: No supraclavicular or axillary adenopathy.     Left upper body: No supraclavicular or axillary adenopathy.  Neurological:  General: No focal deficit present.     Mental Status: He is alert and oriented to person, place, and time.  Psychiatric:        Mood and Affect: Mood normal.        Behavior: Behavior normal.     LABS:      Latest Ref Rng & Units 08/01/2023    8:59 AM 06/29/2023    3:48 AM 06/28/2023    6:43 PM  CBC  WBC 4.0 - 10.5 K/uL 7.2  7.7  10.1   Hemoglobin 13.0 - 17.0 g/dL 16.1  09.6  04.5   Hematocrit 39.0 - 52.0 % 36.4  36.7  42.5   Platelets 150 - 400 K/uL 237  229  244       Latest Ref Rng & Units 08/01/2023    8:59 AM 07/01/2023    4:30 AM 06/29/2023    3:48 AM  CMP  Glucose 70 - 99 mg/dL 409  83  87   BUN 8 - 23 mg/dL 24  14  15    Creatinine 0.61 - 1.24 mg/dL 8.11  9.14  7.82   Sodium 135 - 145 mmol/L 132  134  135   Potassium 3.5 - 5.1 mmol/L 3.9  3.3  3.6   Chloride 98 - 111 mmol/L 90  100  97   CO2 22 - 32 mmol/L 34  26  29   Calcium  8.9 - 10.3 mg/dL 8.6  7.9  8.7   Total Protein 6.5 - 8.1 g/dL 6.3   5.7   Total Bilirubin 0.0 - 1.2 mg/dL 1.0   1.2   Alkaline Phos 38 - 126 U/L 68   60   AST  15 - 41 U/L 22   19   ALT 0 - 44 U/L 24   12      No results found for: CEA1, CEA / No results found for: CEA1, CEA No results found for: PSA1 No results found for: CAN199 No results found for: NFA213  Lab Results  Component Value Date   TOTALPROTELP 6.6 01/03/2021   ALBUMINELP 3.7 01/03/2021   A1GS 0.3 01/03/2021   A2GS 0.8 01/03/2021   BETS 1.1 01/03/2021   GAMS 0.7 01/03/2021   MSPIKE Not Observed 01/03/2021   SPEI Comment 01/03/2021   Lab Results  Component Value Date   TIBC 247 (L) 08/01/2023   TIBC 249 (L) 01/08/2023   TIBC 273 06/29/2022   FERRITIN 65 08/01/2023   FERRITIN 58 01/08/2023   FERRITIN 32 06/29/2022   IRONPCTSAT 15 (L) 08/01/2023   IRONPCTSAT 42 (H) 01/08/2023   IRONPCTSAT 27 06/29/2022   Lab Results  Component Value Date   LDH 134 01/03/2021   LDH 136 06/11/2017   LDH 152 05/22/2017     STUDIES:   CT CHEST W CONTRAST Result Date: 08/02/2023 EXAM: CT CHEST WITH CONTRAST 08/01/2023 10:30:25 AM TECHNIQUE: CT of the chest was performed with the administration of intravenous contrast. Multiplanar reformatted images are provided for review. Automated exposure control, iterative reconstruction, and/or weight based adjustment of the mA/kV was utilized to reduce the radiation dose to as low as reasonably achievable. COMPARISON: 01/08/2023 CLINICAL HISTORY: Non-small cell lung cancer (NSCLC), monitor. Adenocarcinoma of right lung (HCC). FINDINGS: MEDIASTINUM: Trace inferior pericardial fluid. Moderate 3-vessel coronary atherosclerosis. Thoracic aortic atherosclerosis. LYMPH NODES: No mediastinal, hilar or axillary lymphadenopathy. LUNGS AND PLEURA: Mild centrilobular and paraseptal emphysematous changes, upper lung predominant. 18 x 9 mm subpleural nodule in the medial right upper lobe (  image 44), grossly unchanged, with adjacent radiation changes. Mixed density medial right lower lobe nodule measuring 5.6 x 2.6 cm (image 86), previously 4.4 x 2.6 cm  when remeasured in a similar fashion. Associated 2.9 cm solid component posteriorly (image 87), previously 1.6 cm, suspicious for invasive adenocarcinoma. Additional 1.5 cm thin-walled cystic lesion in the central right lower lobe, grossly unchanged. SOFT TISSUES/BONES: No acute abnormality of the bones or soft tissues. UPPER ABDOMEN: Cirrhosis. Stable peribiliary cysts in the central left hepatic lobe, better evaluated on prior MR. Atherosclerotic calcification of the abdominal aorta. IMPRESSION: 1. Mixed density medial right lower lobe nodule measuring 5.6 x 2.6 cm, with progressive 2.9 cm solid component posteriorly, suspicious for invasive adenocarcinoma. 2. Stable medial right upper lobe subpleural nodule with radiation changes, corresponding to the patient's known treated primary bronchogenic carcinoma. 3. Additional stable ancillary findings as above. Electronically signed by: Zadie Herter MD 08/02/2023 08:52 PM EDT RP Workstation: [id number]

## 2023-08-09 ENCOUNTER — Encounter: Payer: Self-pay | Admitting: Physician Assistant

## 2023-08-09 ENCOUNTER — Ambulatory Visit (INDEPENDENT_AMBULATORY_CARE_PROVIDER_SITE_OTHER): Admitting: Physician Assistant

## 2023-08-09 DIAGNOSIS — F03918 Unspecified dementia, unspecified severity, with other behavioral disturbance: Secondary | ICD-10-CM

## 2023-08-09 NOTE — Patient Instructions (Signed)
 It was a pleasure to see you today at our office.   Recommendations:  Follow up in  6 months Continue donepezil  10 mg daily. Side effects were discussed  Continue Memantine  5 mg tablets twice daily.      RECOMMENDATIONS FOR ALL PATIENTS WITH MEMORY PROBLEMS: 1. Continue to exercise (Recommend 30 minutes of walking everyday, or 3 hours every week) 2. Increase social interactions - continue going to [place] and enjoy social gatherings with friends and family 3. Eat healthy, avoid fried foods and eat more fruits and vegetables 4. Maintain adequate blood pressure, blood sugar, and blood cholesterol level. Reducing the risk of stroke and cardiovascular disease also helps promoting better memory. 5. Avoid stressful situations. Live a simple life and avoid aggravations. Organize your time and prepare for the next day in anticipation. 6. Sleep well, avoid any interruptions of sleep and avoid any distractions in the bedroom that may interfere with adequate sleep quality 7. Avoid sugar, avoid sweets as there is a strong link between excessive sugar intake, diabetes, and cognitive impairment We discussed the Mediterranean diet, which has been shown to help patients reduce the risk of progressive memory disorders and reduces cardiovascular risk. This includes eating fish, eat fruits and green leafy vegetables, nuts like almonds and hazelnuts, walnuts, and also use olive oil. Avoid fast foods and fried foods as much as possible. Avoid sweets and sugar as sugar use has been linked to worsening of memory function.  There is always a concern of gradual progression of memory problems. If this is the case, then we may need to adjust level of care according to patient needs. Support, both to the patient and caregiver, should then be put into place.    FALL PRECAUTIONS: Be cautious when walking. Scan the area for obstacles that may increase the risk of trips and falls. When getting up in the mornings, sit up at the  edge of the bed for a few minutes before getting out of bed. Consider elevating the bed at the head end to avoid drop of blood pressure when getting up. Walk always in a well-lit room (use night lights in the walls). Avoid area rugs or power cords from appliances in the middle of the walkways. Use a walker or a cane if necessary and consider physical therapy for balance exercise. Get your eyesight checked regularly.  FINANCIAL OVERSIGHT: Supervision, especially oversight when making financial decisions or transactions is also recommended.  HOME SAFETY: Consider the safety of the kitchen when operating appliances like stoves, microwave oven, and blender. Consider having supervision and share cooking responsibilities until no longer able to participate in those. Accidents with firearms and other hazards in the house should be identified and addressed as well.   ABILITY TO BE LEFT ALONE: If patient is unable to contact 911 operator, consider using LifeLine, or when the need is there, arrange for someone to stay with patients. Smoking is a fire hazard, consider supervision or cessation. Risk of wandering should be assessed by caregiver and if detected at any point, supervision and safe proof recommendations should be instituted.  MEDICATION SUPERVISION: Inability to self-administer medication needs to be constantly addressed. Implement a mechanism to ensure safe administration of the medications.

## 2023-08-09 NOTE — Progress Notes (Signed)
 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 RH male with a history of hypertension, hyperlipidemia, iron deficiency anemia, hypothyroidism, ASCAD, COPD on chronic  2 L O2, stage II right upper Lung Ca, anxiety, depression and  remote history of alcohol abuse seen today in follow up for memory loss. Patient is currently on donepezil  10 mg daily and memantine  5 mg twice daily.  Unfortunately, he is cognitive status has declined significantly, it is unclear if the symptoms are reversible in view of the recent hospitalizations for acute mental status changes related to metabolic encephalopathy.  Suspect that is multifactorial, given the above mentioned, and new lung mass and progression of the disease.  At this moment, I would continue with the current treatment, but if the reversible causes are addressed and treated, and the patient continues to show memory decline, we will discontinue the medicines. He is very hard of hearing, which also could contribute to comprehension.  During the visit, he is quite confused, unable to follow the proper commands, despite being awake.  He has repetitive behavior, including picking invisible objects from his lap placing them on the table.  Prognosis is unclear.  Follow up in 6 months. Continue donepezil  10 mg daily, side effects discussed Continue memantine  5 mg bid side effects discussed  Recommend good control of her cardiovascular risk factors Continue to control mood as per PCP Check hearing aids     Subjective:    This patient is accompanied in the office by his caregiver who supplements the history.  Previous records as well as any outside records available were reviewed prior to todays visit. Patient was last seen on 02/15/2023 with MMSE 19/30.   Any changes in memory since last visit?  Worse than prior-caregiver says.  He  does not participate in activities for the last 1.5 month. He just wants to sleep all day, remaining at rehab room most of the time..  repeats oneself?  He is essentially nonconversant Disoriented when walking into a room? He does not know where he is-caregiver says Leaving objects?He spends on the room all the time.  Wandering behavior?  denies   Any personality changes since last visit?  He had a presentation to the hospital on May 2025 for AMS and generalized weakness, felt to be of metabolic etiology.  He remains confused since discharge.  However he has been doing repetitive behavior such as picking stuff from the floor and placing them on an imaginary table or on the lap. Any worsening depression?:  Denies. Hallucinations or paranoia? Unknown, he is not answering the questions properly. Seizures?  No  Any sleep changes?  He sleeps most of the day.    Sleep apnea?   Denies.   Any hygiene concerns? Denies.  Independent of bathing and dressing? He is total care  Does the patient needs help with medications?  Facility is in charge   Who is in charge of the finances?  His daughter and his son are in charge     Any changes in appetite?  He needs to be fed, he does not to do with the food when presented with it    Patient have trouble swallowing? He is on thickened liquid diet. Does the patient cook? No Any headaches?   denies   Any vision changes? No. Chronic back pain No  Ambulates with difficulty?  He is  wheelchair bound.  In the room, he spends his time in bed.  He is no longer walking. Recent falls or head injuries? Slipped out of the chair because he does not know how to lock it    Unilateral weakness, numbness or tingling? denies   Any tremors?  Denies   Any anosmia?  Denies   Any incontinence of urine?  He has a foley catheter due to recent UTI.  Any bowel dysfunction?   Denies      [facility]     Initial Visit 09/27/20 The patient is seen in  neurologic consultation at the request of Kathyleen Parkins, MD for the evaluation of memory.  The patient is accompanied by caregiver who supplements the history. He is a 83 y.o. year old RH  male who has had memory issues for about  several years, worse over the last 6 months, when he lost his wife.  At the time, he went into a deep depression, as he was planning to move with [facility] at [facility] in [place], and he was going to take his dog with him.  On his wife's death, he felt lost, did not want to get out of the bed, did not want to interact with anyone, and he was feeling very irritable .  Sertraline was tried, without any improvement.  It was during that time, that he was found to be severely hypothyroid with a TSH of 17, and was started on thyroid  medication, which improved significantly his mood, and irritability.  He reports his memory being not good, especially with long-term memory .  He states that at times he cannot remember the names of his grandmother, grandfather, or children. While before he was sleeping the whole day, now he sleeps well at night, without vivid dreams or sleepwalking, paranoia or hallucinations.  Occasionally he takes a nap.He needs assistance with bathing and dressing, as his gait is chronically unstable.  Medications are given by the facility staff, and the finances are managed by his son and daughter who is the power of attorney.  Denies leaving objects in unusual places.  Appetite is better, denies trouble swallowing.  He does not cook.  My wife used to do all the cooking.  He ambulates with the walker very short distances, otherwise he uses a wheelchair to help move from one side to the other, as he is very weak, and has an oxygen  tank with him.  He does participate in physical therapy at the facility.  He denies any recent falls.  He no longer drives because of memory issues .  His son was afraid that I would get lost so he took the keys.  He  denies any headaches, although he had an injury to the head in May 2022, with negative work-up at the ER.  He denies double vision, dizziness, focal numbness or tingling, unilateral weakness or tremors, urine incontinence or retention.  Denies constipation, diarrhea, or anosmia.  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  He quit heavy alcohol 10 years ago mostly beer and bourbon.  He quit 1 pack a day tobacco use for most of his life.  Family history remarkable for dementia in father.   CT head wo contrast 07/19/20 No evidence of acute intracranial abnormality.Right anterior scalp hematoma.Stable mild-to-moderate cerebral atrophy and cerebral white matter chronic small vessel ischemic disease. Known small chronic right cerebellar infarct.Mild paranasal sinus mucosal thickening at the imaged levels,  most notably right maxillary. Right mastoid effusion.  PREVIOUS MEDICATIONS:   CURRENT MEDICATIONS:  Outpatient Encounter Medications as of 08/09/2023  Medication Sig   acetaminophen  (TYLENOL ) 325 MG tablet Take 325 mg by mouth every 4 (four) hours as needed for mild pain (pain score 1-3).   albuterol  (VENTOLIN  HFA) 108 (90 Base) MCG/ACT inhaler Inhale 1 puff into the lungs 4 (four) times daily as needed. (Patient taking differently: Inhale 1 puff into the lungs 4 (four) times daily as needed for shortness of breath or wheezing.)   Aloe Vera GEL Apply 1 Application topically in the morning, at noon, and at bedtime. Apply to nasal carriage   benzonatate  (TESSALON ) 200 MG capsule Take 200 mg by mouth 3 (three) times daily as needed for cough.   carvedilol  (COREG ) 3.125 MG tablet Take 1 tablet (3.125 mg total) by mouth 2 (two) times daily with a meal.   cholecalciferol  (VITAMIN D ) 1000 units tablet Take 1,000 Units by mouth daily.   cycloSPORINE  (RESTASIS ) 0.05 % ophthalmic emulsion Place 1 drop into both eyes 2 (two) times daily.   desonide (DESOWEN) 0.05 % cream Apply 1 Application topically  2 (two) times daily as needed (itching). Apply to face, scalp, and ears   diclofenac  Sodium (VOLTAREN ) 1 % GEL Apply 2 g topically 4 (four) times daily as needed (pain). Apply to left lateral ankle   docusate sodium  (COLACE) 100 MG capsule Take 100 mg by mouth 2 (two) times daily.   donepezil  (ARICEPT ) 10 MG tablet TAKE 1 TABLET BY MOUTH AT BEDTIME.   FEROSUL 325 (65 Fe) MG tablet Take 325 mg by mouth daily with breakfast.   GOODSENSE ARTIFICIAL TEARS 0.5-0.6 % SOLN Place 2 drops into both eyes every 8 (eight) hours as needed (dry eyes).   ipratropium-albuterol  (DUONEB) 0.5-2.5 (3) MG/3ML SOLN Take 3 mLs by nebulization every 4 (four) hours as needed (wheezing, shortness of breath).   Lactobacillus-Inulin (CULTURELLE DIGESTIVE DAILY PO) Take 1 capsule by mouth daily.   levothyroxine  (SYNTHROID ) 75 MCG tablet Take 1 tablet (75 mcg total) by mouth daily at 6 (six) AM.   melatonin 5 MG TABS Take 5 mg by mouth at bedtime.   memantine  (NAMENDA ) 5 MG tablet Take 1 tablet (5 mg at night) for 2 weeks, then increase to 1 tablet (5 mg) twice a day (Patient taking differently: Take 5 mg by mouth 2 (two) times daily.)   MUCUS RELIEF 600 MG 12 hr tablet Take 600 mg by mouth 2 (two) times daily as needed for cough.   OXYGEN  Inhale 2 L into the lungs continuous.   PARoxetine (PAXIL) 20 MG tablet Take 20 mg by mouth daily.   Psyllium (METAMUCIL PO) Take 1 capsule by mouth in the morning and at bedtime.   rosuvastatin  (CRESTOR ) 10 MG tablet Take 1 tablet (10 mg total) by mouth daily.   tamsulosin  (FLOMAX ) 0.4 MG CAPS capsule Take 1 capsule (0.4 mg total) by mouth 2 (two) times daily.   TRELEGY ELLIPTA 200-62.5-25 MCG/ACT AEPB Inhale 1 puff into the lungs daily. Rinse mouth after use   vitamin B-12 (CYANOCOBALAMIN ) 1000 MCG tablet Take 1 tablet (1,000 mcg total) by mouth daily.   No facility-administered encounter medications on file as of 08/09/2023.       02/15/2023   10:00 AM 08/15/2022   12:00 PM  02/13/2022    9:00 AM  MMSE - Mini Mental State Exam  Orientation to time 3 3 5   Orientation to Place 3 5 5  Registration 3 3 3   Attention/ Calculation 2 4 5   Recall 0 2 1  Language- name 2 objects 2 2 2   Language- repeat 1 1 1   Language- follow 3 step command 3 3 3   Language- read & follow direction 1 1 1   Write a sentence 1 1 0  Copy design 0 0 0  Total score 19 25 26       09/27/2020   12:00 PM  Montreal Cognitive Assessment   Visuospatial/ Executive (0/5) 0  Naming (0/3) 2  Attention: Read list of digits (0/2) 2  Attention: Read list of letters (0/1) 1  Attention: Serial 7 subtraction starting at 100 (0/3) 1  Language: Repeat phrase (0/2) 1  Language : Fluency (0/1) 0  Abstraction (0/2) 0  Delayed Recall (0/5) 0  Orientation (0/6) 4  Total 11  Adjusted Score (based on education) 11    Objective:     PHYSICAL EXAMINATION:    VITALS:  There were no vitals filed for this visit.  GEN:  The patient appears stated age and is in NAD. HEENT:  Normocephalic, atraumatic.   Neurological examination:  General: NAD, well-groomed, appears stated age. Orientation: The patient is alert.  Not oriented to person, place and not to date Cranial nerves: There is good facial symmetry.flat affect.  The speech is fluent and clear. No aphasia or dysarthria. Fund of knowledge is reduced. Recent and remote memory are impaired. Attention and concentration are reduced.  Unable to name objects and repeat phrases.  Hearing is i decreased to conversational tone.   Sensation: Sensation is intact to light touch throughout Motor unable to test the patient is not following the proper commands. DTR's 2/4 in UE/LE     Movement examination: Tone: There is normal tone in the UE/LE Abnormal movements:  no tremor.  No myoclonus.  No asterixis.  However, his hands are contracted bilaterally Coordination:  There is decremation with RAM's.  Able to perform  finger to nose  Gait and Station: He is  wheelchair-bound, unable to test gait or stride  Thank you for allowing us  the opportunity to participate in the care of this nice patient. Please do not hesitate to contact us  for any questions or concerns.   Total time spent on today's visit was 28 minutes dedicated to this patient today, preparing to see patient, examining the patient, ordering tests and/or medications and counseling the patient, documenting clinical information in the EHR or other health record, independently interpreting results and communicating results to the patient/family, discussing treatment and goals, answering patient's questions and coordinating care.  Cc:  Darcel Early, MD  Tex Filbert 08/09/2023 10:21 AM

## 2023-08-14 DIAGNOSIS — C3491 Malignant neoplasm of unspecified part of right bronchus or lung: Secondary | ICD-10-CM | POA: Diagnosis not present

## 2023-08-14 DIAGNOSIS — C3490 Malignant neoplasm of unspecified part of unspecified bronchus or lung: Secondary | ICD-10-CM | POA: Diagnosis not present

## 2023-08-14 DIAGNOSIS — F0390 Unspecified dementia without behavioral disturbance: Secondary | ICD-10-CM | POA: Diagnosis not present

## 2023-08-16 ENCOUNTER — Encounter (HOSPITAL_COMMUNITY)
Admission: RE | Admit: 2023-08-16 | Discharge: 2023-08-16 | Disposition: A | Discharge status: Home / Self Care | Source: Ambulatory Visit | Attending: Hematology | Admitting: Hematology

## 2023-08-16 ENCOUNTER — Ambulatory Visit: Payer: PPO | Admitting: Physician Assistant

## 2023-08-16 DIAGNOSIS — C3490 Malignant neoplasm of unspecified part of unspecified bronchus or lung: Secondary | ICD-10-CM | POA: Diagnosis present

## 2023-08-16 DIAGNOSIS — C3431 Malignant neoplasm of lower lobe, right bronchus or lung: Secondary | ICD-10-CM | POA: Diagnosis not present

## 2023-08-16 MED ORDER — FLUDEOXYGLUCOSE F - 18 (FDG) INJECTION
6.4500 | Freq: Once | INTRAVENOUS | Status: AC | PRN
  Administered 2023-08-16: 6.45 via INTRAVENOUS

## 2023-08-21 DIAGNOSIS — E039 Hypothyroidism, unspecified: Secondary | ICD-10-CM | POA: Diagnosis not present

## 2023-08-21 DIAGNOSIS — C3490 Malignant neoplasm of unspecified part of unspecified bronchus or lung: Secondary | ICD-10-CM | POA: Diagnosis not present

## 2023-08-21 DIAGNOSIS — C3491 Malignant neoplasm of unspecified part of right bronchus or lung: Secondary | ICD-10-CM | POA: Diagnosis not present

## 2023-08-21 DIAGNOSIS — J441 Chronic obstructive pulmonary disease with (acute) exacerbation: Secondary | ICD-10-CM | POA: Diagnosis not present

## 2023-08-22 DIAGNOSIS — C3490 Malignant neoplasm of unspecified part of unspecified bronchus or lung: Secondary | ICD-10-CM | POA: Diagnosis not present

## 2023-08-22 DIAGNOSIS — K862 Cyst of pancreas: Secondary | ICD-10-CM | POA: Diagnosis not present

## 2023-08-22 DIAGNOSIS — E039 Hypothyroidism, unspecified: Secondary | ICD-10-CM | POA: Diagnosis not present

## 2023-08-22 DIAGNOSIS — C3491 Malignant neoplasm of unspecified part of right bronchus or lung: Secondary | ICD-10-CM | POA: Diagnosis not present

## 2023-08-27 ENCOUNTER — Other Ambulatory Visit: Payer: Self-pay | Admitting: *Deleted

## 2023-08-27 ENCOUNTER — Inpatient Hospital Stay (HOSPITAL_BASED_OUTPATIENT_CLINIC_OR_DEPARTMENT_OTHER): Admitting: Hematology

## 2023-08-27 VITALS — BP 121/69 | HR 65 | Temp 96.2°F | Resp 20

## 2023-08-27 DIAGNOSIS — C3491 Malignant neoplasm of unspecified part of right bronchus or lung: Secondary | ICD-10-CM | POA: Diagnosis not present

## 2023-08-27 DIAGNOSIS — D509 Iron deficiency anemia, unspecified: Secondary | ICD-10-CM | POA: Diagnosis not present

## 2023-08-27 DIAGNOSIS — C3411 Malignant neoplasm of upper lobe, right bronchus or lung: Secondary | ICD-10-CM | POA: Diagnosis not present

## 2023-08-27 MED ORDER — LEVOTHYROXINE SODIUM 150 MCG PO TABS: 150.0000 ug | ORAL_TABLET | Freq: Every day | ORAL | 2 refills | Status: DC

## 2023-08-27 MED ORDER — LEVOFLOXACIN 500 MG PO TABS: 500.0000 mg | ORAL_TABLET | Freq: Every day | ORAL | 0 refills | Status: DC

## 2023-08-27 NOTE — Progress Notes (Signed)
 [facility] [address] [place] [address]    Clinic Day:  08/27/2023  Referring physician: Marvine Rush, MD  Patient Care Team: Isaiah Leisure, MD as PCP - General (Internal Medicine) Shaaron Lamar HERO, MD (Gastroenterology) Dina Camie FORBES RIGGERS (Neurology)   ASSESSMENT & PLAN:   Assessment: 1.  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 -Durvalumab  consolidation from 09/19/2017 through 09/17/2018.    Plan: 1.  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 assisted living facility after stroke. - Admission from 06/28/2023 through 07/04/2023 with acute mental status changes and urinary retention.  He has a catheter.  He is now at [facility]. - CT chest (08/01/2023): Mixed density medial right lower lobe nodule measuring 5.6 x 2.6 cm with progressive 2.9 cm solid component posteriorly suspicious for adenocarcinoma.  Stable medial right upper lobe subpleural nodule with radiation changes. - We reviewed PET scan images from 08/16/2023: Hypermetabolic subpleural consolidation in the medial right lower lobe with SUV 3.6.  New subpleural consolidation in the inferior posterior left lower lobe, SUV 5.0.  No other evidence of hypermetabolism.  Radiology report favors infection/inflammation. - He is having cough and expectoration.  Will prescribe him Levaquin 500 mg daily for 10 days. - Recommend follow-up in 3 months with repeat CT chest with contrast.   2.  Hypothyroidism: - He is on Synthroid  75 mcg daily.  TSH is elevated at 71.  Will increase Synthroid  from 50 mcg daily and check his TSH when he comes back.  3.  Normocytic anemia: - Continue iron liquid daily.  Last hemoglobin was 11.8 and ferritin was 65.  Will monitor ferritin and iron panel at next visit.    Orders Placed This Encounter  Procedures   CT CHEST W CONTRAST     Standing Status:   Future    Expected Date:   11/27/2023    Expiration Date:   08/26/2024    If indicated for the ordered procedure, I authorize the administration of contrast media per Radiology protocol:   Yes    Does the patient have a contrast media/X-ray dye allergy?:   No    Preferred imaging location?:   [facility]   CBC with Differential    Standing Status:   Future    Expected Date:   11/26/2023    Expiration Date:   02/24/2024   Comprehensive metabolic panel    Standing Status:   Future    Expected Date:   11/26/2023    Expiration Date:   02/24/2024   Iron and TIBC ([facility] ONLY)    Standing Status:   Future    Expected Date:   11/26/2023    Expiration Date:   02/24/2024   Ferritin    Standing Status:   Future    Expected Date:   11/26/2023    Expiration Date:   02/24/2024     LILLETTE Hummingbird R Teague,acting as a scribe for Alean Stands, MD.,have documented all relevant documentation on the behalf of Alean Stands, MD,as directed by  Alean Stands, MD while in the presence of Alean Stands, MD.  I, Alean Stands MD, have reviewed the above documentation for accuracy and completeness, and I agree with the above.     Alean Stands, MD   6/30/202510:32 AM  CHIEF COMPLAINT:   Diagnosis: stage II right upper lobe adenocarcinoma    Cancer  Staging  No matching staging information was found for the patient.    Prior Therapy: 1. Chemoradiation with carboplatin  and paclitaxel  weekly from 06/11/2017 to 07/16/2017. 2. Consolidation with durvalumab  from 09/19/2017 to 09/17/2018.  Current Therapy:  surveillance    HISTORY OF PRESENT ILLNESS:   Oncology History  Carcinoma, lung (HCC)  06/05/2017 Initial Diagnosis   Carcinoma, lung (HCC)   06/11/2017 - 07/16/2017 Chemotherapy   The patient had dexamethasone  (DECADRON ) 4 MG tablet, 8 mg, Oral, Daily, 1 of 1 cycle, Start date: --, End date: -- palonosetron  (ALOXI )  injection 0.25 mg, 0.25 mg, Intravenous,  Once, 6 of 6 cycles Administration: 0.25 mg (06/11/2017), 0.25 mg (07/09/2017), 0.25 mg (07/16/2017), 0.25 mg (06/18/2017), 0.25 mg (06/25/2017), 0.25 mg (07/02/2017) CARBOplatin  (PARAPLATIN ) 190 mg in sodium chloride  0.9 % 250 mL chemo infusion, 190 mg (100 % of original dose 192.8 mg), Intravenous,  Once, 6 of 6 cycles Dose modification:   (original dose 192.8 mg, Cycle 1),   (original dose 192.8 mg, Cycle 5), 144.6 mg (original dose 144.6 mg, Cycle 6),   (original dose 192.8 mg, Cycle 2),   (original dose 192.8 mg, Cycle 3),   (original dose 192.8 mg, Cycle 4) Administration: 190 mg (06/11/2017), 190 mg (07/09/2017), 140 mg (07/16/2017), 190 mg (06/18/2017), 190 mg (06/25/2017), 190 mg (07/02/2017) PACLitaxel  (TAXOL ) 90 mg in dextrose  5 % 250 mL chemo infusion (</= 80mg /m2), 45 mg/m2 = 90 mg, Intravenous,  Once, 6 of 6 cycles Dose modification: 40.5 mg/m2 (90 % of original dose 45 mg/m2, Cycle 5, Reason: Other (see comments), Comment: early neuropathy), 36 mg/m2 (80 % of original dose 45 mg/m2, Cycle 6, Reason: Provider Judgment) Administration: 90 mg (06/11/2017), 78 mg (07/09/2017), 72 mg (07/16/2017), 90 mg (06/18/2017), 78 mg (06/25/2017), 78 mg (07/02/2017)  for chemotherapy treatment.    Malignant neoplasm of bronchus and lung (HCC)  06/08/2017 Initial Diagnosis   Malignant neoplasm of bronchus and lung (HCC)   09/20/2017 - 09/17/2018 Chemotherapy   Patient is on Treatment Plan : LUNG DURVALUMAB  Q14D        INTERVAL HISTORY:   Ian Aguilar is a 83 y.o. male seen for follow-up of right lung adenocarcinoma and iron deficiency anemia.  He was last seen by me on 08/08/23.  Since his last visit, he underwent restaging PET on 08/16/23 that found: Hypermetabolic subpleural consolidation in the medial right lower lobe with new hypermetabolic subpleural consolidation in the left lower lobe, favoring an infectious/inflammatory etiology. Otherwise, no evidence of recurrent or metastatic  disease. Trace left pleural effusion. Cirrhosis with small low-attenuation lesions in the liver. Low-attenuation lesion in the uncinate process of the pancreas. Punctate left renal stone. Small to moderate pelvic free fluid. Aortic atherosclerosis. Coronary artery calcification. Emphysema  Today, he states that he is doing well overall. His appetite level is at 50%. His energy level is at 7 5%.   PAST MEDICAL HISTORY:   Past Medical History: Past Medical History:  Diagnosis Date   Anxiety    Arthritis    Cancer (HCC)    Cirrhosis (HCC)    confirmed by MRI on 07/04/11.     COPD (chronic obstructive pulmonary disease) (HCC)    Depression with anxiety    ETOH abuse    Hyperlipidemia    Hypertension    diet control   Nodule of upper lobe of right lung    with mediastinal adenopathy   Wears dentures    Wears glasses     Surgical History: Past Surgical History:  Procedure Laterality Date   bilateral cataract surgery     [place]   broken arm  6 yrs ago   left otif of wrist-[place]   CLOSED REDUCTION WRIST FRACTURE Right 09/02/2015   Procedure: RIGHT WRIST REDUCTION;  Surgeon: Evalene JONETTA Chancy, MD;  Location: [facility] OR;  Service: Orthopedics;  Laterality: Right;  with MAC   COLONOSCOPY  1996   [facility]: external hemorrhoids, no polyps   COLONOSCOPY  06/28/2011   Dr. Marlana diverticulosis,tubular adenoma, hyperplastic polyp   COLONOSCOPY WITH PROPOFOL  N/A 04/26/2017   Dr. Shaaron: perianal and digital rectal examinations normal, diffusely congested colonic mucosa but otherwise normal   ESOPHAGOGASTRODUODENOSCOPY  06/28/2011   Dr. Renny erosive reflux esophagitis, hiatal hernia-gastritis   ESOPHAGOGASTRODUODENOSCOPY (EGD) WITH PROPOFOL  N/A 04/26/2017   Dr. Shaaron: normal esophagus, small hiatal hernia, GAVE, portal hypertensive gastropathy, normal duodenal bulb and second portion, no specimens collected. 2 years screening    ESOPHAGOGASTRODUODENOSCOPY (EGD) WITH PROPOFOL  N/A 06/19/2019    normal esophagus, portal gastropathy, GAVE, normal duodenum. 2 years screening.    EXTERNAL EAR SURGERY     right ear-cleaned out ear and created new eardrum   INGUINAL HERNIA REPAIR  2-3 yrs ago   left-[place]   INTRAMEDULLARY (IM) NAIL INTERTROCHANTERIC Right 09/02/2015   Procedure: RIGHT  INTERTROCHANTRIC HIP;  Surgeon: Evalene JONETTA Chancy, MD;  Location: [facility] OR;  Service: Orthopedics;  Laterality: Right;  With MAC   KNEE SURGERY     left-arthroscopy-[place]   PORTACATH PLACEMENT Left 06/08/2017   Procedure: INSERTION POWER PORT WITH  ATTACHED CATHETER LEFT SUBCLAVIAN;  Surgeon: Mavis Anes, MD;  Location: [facility] ORS;  Service: General;  Laterality: Left;   SKULL FRACTURE ELEVATION     fractured cheeck bone repaired   TOTAL HIP ARTHROPLASTY  12/18/2011   Procedure: TOTAL HIP ARTHROPLASTY;  Surgeon: Taft FORBES Minerva, MD;  Location: [facility];  Service: Orthopedics;  Laterality: Left;   VIDEO BRONCHOSCOPY WITH ENDOBRONCHIAL ULTRASOUND N/A 05/04/2017   Procedure: VIDEO BRONCHOSCOPY WITH ENDOBRONCHIAL ULTRASOUND;  Surgeon: Kerrin Elspeth BROCKS, MD;  Location[facility] OR;  Service: Thoracic;  Laterality: N/A;   WISDOM TOOTH EXTRACTION      Social History: Social History   Socioeconomic History   Marital status: Widowed    Spouse name: Not on file   Number of children: 2   Years of education: 89   Highest education level: Not on file  Occupational History   Occupation: retired    Associate Professor: [employer]  Tobacco Use   Smoking status: Former    Current packs/day: 0.00    Types: Cigarettes    Quit date: 02/27/1957    Years since quitting: 66.5   Smokeless tobacco: Never   Tobacco comments:    1/2 ppd > 60 years as of 06/27/17: 4-5 cigarettes or less a day  Vaping Use   Vaping status: Never Used  Substance and Sexual Activity   Alcohol use: Not Currently    Alcohol/week: 2.0 standard drinks of alcohol    Types: 2 Cans of beer per week   Drug use: No   Sexual activity: Yes     Birth control/protection: None  Other Topics Concern   Not on file  Social History Narrative   Right handed   Drinks caffeine   Assistant living   Retired   Has one son and daughter    Social Drivers of Corporate investment banker Strain: Not on file  Food Insecurity: Patient Unable To Answer (06/29/2023)   Hunger  Vital Sign    Worried About Programme researcher, broadcasting/film/video in the Last Year: Patient unable to answer    Ran Out of Food in the Last Year: Patient unable to answer  Transportation Needs: Patient Unable To Answer (06/29/2023)   PRAPARE - Transportation    Lack of Transportation (Medical): Patient unable to answer    Lack of Transportation (Non-Medical): Patient unable to answer  Physical Activity: Not on file  Stress: Not on file  Social Connections: Patient Unable To Answer (06/29/2023)   Social Connection and Isolation Panel    Frequency of Communication with Friends and Family: Patient unable to answer    Frequency of Social Gatherings with Friends and Family: Patient unable to answer    Attends Religious Services: Patient unable to answer    Active Member of Clubs or Organizations: Patient unable to answer    Attends Banker Meetings: Patient unable to answer    Marital Status: Patient unable to answer  Recent Concern: Social Connections - Socially Isolated (05/08/2023)   Social Connection and Isolation Panel    Frequency of Communication with Friends and Family: Twice a week    Frequency of Social Gatherings with Friends and Family: Twice a week    Attends Religious Services: Never    Database administrator or Organizations: No    Attends Banker Meetings: Never    Marital Status: Widowed  Intimate Partner Violence: Patient Unable To Answer (06/29/2023)   Humiliation, Afraid, Rape, and Kick questionnaire    Fear of Current or Ex-Partner: Patient unable to answer    Emotionally Abused: Patient unable to answer    Physically Abused: Patient unable to  answer    Sexually Abused: Patient unable to answer    Family History: Family History  Problem Relation Age of Onset   Stroke Mother 54   Heart disease Father 77       MI   Diabetes Maternal Uncle    Colon cancer Neg Hx    Cancer Neg Hx     Current Medications:  Current Outpatient Medications:    acetaminophen  (TYLENOL ) 325 MG tablet, Take 325 mg by mouth every 4 (four) hours as needed for mild pain (pain score 1-3)., Disp: , Rfl:    albuterol  (VENTOLIN  HFA) 108 (90 Base) MCG/ACT inhaler, Inhale 1 puff into the lungs 4 (four) times daily as needed., Disp: 8 g, Rfl: 2   amLODipine  (NORVASC ) 5 MG tablet, Take 5 mg by mouth daily., Disp: , Rfl:    budesonide -glycopyrrolate-formoterol  (BREZTRI AEROSPHERE) 160-9-4.8 MCG/ACT AERO inhaler, Inhale 1 puff into the lungs 2 (two) times daily., Disp: , Rfl:    carvedilol  (COREG ) 3.125 MG tablet, Take 1 tablet (3.125 mg total) by mouth 2 (two) times daily with a meal., Disp: , Rfl:    cholecalciferol  (VITAMIN D ) 1000 units tablet, Take 1,000 Units by mouth daily., Disp: , Rfl:    cycloSPORINE  (RESTASIS ) 0.05 % ophthalmic emulsion, Place 1 drop into both eyes 2 (two) times daily., Disp: , Rfl:    desonide (DESOWEN) 0.05 % cream, Apply 1 Application topically 2 (two) times daily as needed (itching). Apply to face, scalp, and ears, Disp: , Rfl:    diclofenac  Sodium (VOLTAREN ) 1 % GEL, Apply 2 g topically 4 (four) times daily as needed (pain). Apply to left lateral ankle, Disp: , Rfl:    docusate sodium  (COLACE) 100 MG capsule, Take 100 mg by mouth 2 (two) times daily., Disp: , Rfl:  donepezil  (ARICEPT ) 10 MG tablet, TAKE 1 TABLET BY MOUTH AT BEDTIME., Disp: 30 tablet, Rfl: 4   FEROSUL 325 (65 Fe) MG tablet, Take 325 mg by mouth daily with breakfast., Disp: , Rfl:    GOODSENSE ARTIFICIAL TEARS 0.5-0.6 % SOLN, Place 2 drops into both eyes every 8 (eight) hours as needed (dry eyes)., Disp: , Rfl:    ipratropium-albuterol  (DUONEB) 0.5-2.5 (3) MG/3ML  SOLN, Take 3 mLs by nebulization every 4 (four) hours as needed (wheezing, shortness of breath)., Disp: 360 mL, Rfl: 1   Lactobacillus-Inulin (CULTURELLE DIGESTIVE DAILY PO), Take 1 capsule by mouth daily., Disp: , Rfl:    levofloxacin (LEVAQUIN) 500 MG tablet, Take 1 tablet (500 mg total) by mouth daily., Disp: 10 tablet, Rfl: 0   levothyroxine  (SYNTHROID ) 75 MCG tablet, Take 1 tablet (75 mcg total) by mouth daily at 6 (six) AM., Disp: , Rfl:    melatonin 5 MG TABS, Take 5 mg by mouth at bedtime., Disp: , Rfl:    memantine  (NAMENDA ) 5 MG tablet, Take 1 tablet (5 mg at night) for 2 weeks, then increase to 1 tablet (5 mg) twice a day, Disp: 60 tablet, Rfl: 11   MUCUS RELIEF 600 MG 12 hr tablet, Take 600 mg by mouth 2 (two) times daily as needed for cough., Disp: , Rfl:    OXYGEN , Inhale 2 L into the lungs continuous., Disp: , Rfl:    PARoxetine (PAXIL) 20 MG tablet, Take 20 mg by mouth daily., Disp: , Rfl:    Psyllium (METAMUCIL PO), Take 1 capsule by mouth in the morning and at bedtime., Disp: , Rfl:    rosuvastatin  (CRESTOR ) 10 MG tablet, Take 1 tablet (10 mg total) by mouth daily., Disp: 30 tablet, Rfl: 0   senna (SENOKOT) 8.6 MG tablet, Take 1 tablet by mouth daily., Disp: , Rfl:    tamsulosin  (FLOMAX ) 0.4 MG CAPS capsule, Take 1 capsule (0.4 mg total) by mouth 2 (two) times daily., Disp: 60 capsule, Rfl: 11   vitamin B-12 (CYANOCOBALAMIN ) 1000 MCG tablet, Take 1 tablet (1,000 mcg total) by mouth daily., Disp: 30 tablet, Rfl: 6   Aloe Vera GEL, Apply 1 Application topically in the morning, at noon, and at bedtime. Apply to nasal carriage (Patient not taking: Reported on 08/27/2023), Disp: , Rfl:    benzonatate  (TESSALON ) 200 MG capsule, Take 200 mg by mouth 3 (three) times daily as needed for cough. (Patient not taking: Reported on 08/27/2023), Disp: , Rfl:    Allergies: No Known Allergies  REVIEW OF SYSTEMS:   Review of Systems  Constitutional:  Negative for chills, fatigue and fever.   HENT:   Negative for lump/mass, mouth sores, nosebleeds, sore throat and trouble swallowing.   Eyes:  Negative for eye problems.  Respiratory:  Positive for cough. Negative for shortness of breath.   Cardiovascular:  Negative for chest pain, leg swelling and palpitations.  Gastrointestinal:  Negative for abdominal pain, constipation, diarrhea, nausea and vomiting.  Genitourinary:  Negative for bladder incontinence, difficulty urinating, dysuria, frequency, hematuria and nocturia.   Musculoskeletal:  Negative for arthralgias, back pain, flank pain, myalgias and neck pain.  Skin:  Negative for itching and rash.  Neurological:  Negative for dizziness, headaches and numbness.  Hematological:  Does not bruise/bleed easily.  Psychiatric/Behavioral:  Negative for depression, sleep disturbance and suicidal ideas. The patient is not nervous/anxious.   All other systems reviewed and are negative.    VITALS:   Blood pressure 121/69, pulse 65, temperature (!)  96.2 F (35.7 C), temperature source Tympanic, resp. rate 20, SpO2 91%.  Wt Readings from Last 3 Encounters:  07/03/23 154 lb 12.2 oz (70.2 kg)  06/27/23 155 lb (70.3 kg)  06/20/23 155 lb 3.3 oz (70.4 kg)    There is no height or weight on file to calculate BMI.  Performance status (ECOG): 1 - Symptomatic but completely ambulatory  PHYSICAL EXAM:   Physical Exam Vitals and nursing note reviewed. Exam conducted with a chaperone present.  Constitutional:      Appearance: Normal appearance.   Cardiovascular:     Rate and Rhythm: Normal rate and regular rhythm.     Pulses: Normal pulses.     Heart sounds: Normal heart sounds.  Pulmonary:     Effort: Pulmonary effort is normal.     Breath sounds: Normal breath sounds.  Abdominal:     Palpations: Abdomen is soft. There is no hepatomegaly, splenomegaly or mass.     Tenderness: There is no abdominal tenderness.   Musculoskeletal:     Right lower leg: No edema.     Left lower leg: No  edema.  Lymphadenopathy:     Cervical: No cervical adenopathy.     Right cervical: No superficial, deep or posterior cervical adenopathy.    Left cervical: No superficial, deep or posterior cervical adenopathy.     Upper Body:     Right upper body: No supraclavicular or axillary adenopathy.     Left upper body: No supraclavicular or axillary adenopathy.   Neurological:     General: No focal deficit present.     Mental Status: He is alert and oriented to person, place, and time.   Psychiatric:        Mood and Affect: Mood normal.        Behavior: Behavior normal.     LABS:      Latest Ref Rng & Units 08/01/2023    8:59 AM 06/29/2023    3:48 AM 06/28/2023    6:43 PM  CBC  WBC 4.0 - 10.5 K/uL 7.2  7.7  10.1   Hemoglobin 13.0 - 17.0 g/dL 88.1  87.7  85.5   Hematocrit 39.0 - 52.0 % 36.4  36.7  42.5   Platelets 150 - 400 K/uL 237  229  244       Latest Ref Rng & Units 08/01/2023    8:59 AM 07/01/2023    4:30 AM 06/29/2023    3:48 AM  CMP  Glucose 70 - 99 mg/dL 896  83  87   BUN 8 - 23 mg/dL 24  14  15    Creatinine 0.61 - 1.24 mg/dL 9.15  9.23  9.17   Sodium 135 - 145 mmol/L 132  134  135   Potassium 3.5 - 5.1 mmol/L 3.9  3.3  3.6   Chloride 98 - 111 mmol/L 90  100  97   CO2 22 - 32 mmol/L 34  26  29   Calcium  8.9 - 10.3 mg/dL 8.6  7.9  8.7   Total Protein 6.5 - 8.1 g/dL 6.3   5.7   Total Bilirubin 0.0 - 1.2 mg/dL 1.0   1.2   Alkaline Phos 38 - 126 U/L 68   60   AST 15 - 41 U/L 22   19   ALT 0 - 44 U/L 24   12      No results found for: CEA1, CEA / No results found for: CEA1, CEA No results found for: PSA1  No results found for: CAN199 No results found for: RJW874  Lab Results  Component Value Date   TOTALPROTELP 6.6 01/03/2021   ALBUMINELP 3.7 01/03/2021   A1GS 0.3 01/03/2021   A2GS 0.8 01/03/2021   BETS 1.1 01/03/2021   GAMS 0.7 01/03/2021   MSPIKE Not Observed 01/03/2021   SPEI Comment 01/03/2021   Lab Results  Component Value Date   TIBC 247 (L)  08/01/2023   TIBC 249 (L) 01/08/2023   TIBC 273 06/29/2022   FERRITIN 65 08/01/2023   FERRITIN 58 01/08/2023   FERRITIN 32 06/29/2022   IRONPCTSAT 15 (L) 08/01/2023   IRONPCTSAT 42 (H) 01/08/2023   IRONPCTSAT 27 06/29/2022   Lab Results  Component Value Date   LDH 134 01/03/2021   LDH 136 06/11/2017   LDH 152 05/22/2017     STUDIES:   NM PET Image Restage (PS) Skull Base to Thigh (F-18 FDG) Result Date: 08/25/2023 CLINICAL DATA:  Subsequent treatment strategy for non-small cell lung cancer. EXAM: NUCLEAR MEDICINE PET SKULL BASE TO THIGH TECHNIQUE: 6.5 mCi F-18 FDG was injected intravenously. Full-ring PET imaging was performed from the skull base to thigh after the radiotracer. CT data was obtained and used for attenuation correction and anatomic localization. Fasting blood glucose: 128 mg/dl COMPARISON:  CT chest 93/95/7974, MR abdomen 06/29/2023, CT abdomen pelvis 06/28/2023 and PET 03/29/2017. FINDINGS: Mediastinal blood pool activity: SUV max 3.1 Liver activity: SUV max NA NECK: No abnormal hypermetabolism. Incidental CT findings: None. CHEST: Irregular subpleural consolidation in the medial right lower lobe (203/61), SUV max 3.6. New subpleural consolidation in the inferior of posterior left lower lobe (203/76), also hypermetabolic, SUV max 5.0. No additional abnormal hypermetabolism. Incidental CT findings: Left IJ Port-A-Cath terminates in the SVC. Atherosclerotic calcification of the aorta, aortic valve and coronary arteries. Heart is at the upper limits of normal in size. Trace left pleural effusion. Centrilobular emphysema. Post radiation scarring in the medial right upper lobe. ABDOMEN/PELVIS: No abnormal hypermetabolism. Incidental CT findings: Liver margin is irregular. Low-attenuation lesions in the liver. Punctate left renal stone. Small low-attenuation lesion in the uncinate process of the pancreas. Small to moderate pelvic free fluid. SKELETON: No abnormal hypermetabolism.  Incidental CT findings: Left hip arthroplasty. Postoperative changes in the proximal right femur. Osteopenia. Degenerative changes in the spine. IMPRESSION: 1. Hypermetabolic subpleural consolidation in the medial right lower lobe with new hypermetabolic subpleural consolidation in the left lower lobe, favoring an infectious/inflammatory etiology. Recommend short-term follow-up CT chest without contrast in 3-4 weeks, after appropriate clinical therapy, as clinically indicated. 2. Otherwise, no evidence of recurrent or metastatic disease. 3. Trace left pleural effusion. 4. Cirrhosis with small low-attenuation lesions in the liver. Low-attenuation lesion in the uncinate process of the pancreas. Findings better evaluated on MR abdomen 06/29/2023. 5. Punctate left renal stone. 6. Small to moderate pelvic free fluid. 7. Aortic atherosclerosis (ICD10-I70.0). Coronary artery calcification. 8.  Emphysema (ICD10-J43.9). Electronically Signed   By: Newell Eke M.D.   On: 08/25/2023 13:21   CT CHEST W CONTRAST Result Date: 08/02/2023 EXAM: CT CHEST WITH CONTRAST 08/01/2023 10:30:25 AM TECHNIQUE: CT of the chest was performed with the administration of intravenous contrast. Multiplanar reformatted images are provided for review. Automated exposure control, iterative reconstruction, and/or weight based adjustment of the mA/kV was utilized to reduce the radiation dose to as low as reasonably achievable. COMPARISON: 01/08/2023 CLINICAL HISTORY: Non-small cell lung cancer (NSCLC), monitor. Adenocarcinoma of right lung (HCC). FINDINGS: MEDIASTINUM: Trace inferior pericardial fluid. Moderate 3-vessel coronary atherosclerosis. Thoracic  aortic atherosclerosis. LYMPH NODES: No mediastinal, hilar or axillary lymphadenopathy. LUNGS AND PLEURA: Mild centrilobular and paraseptal emphysematous changes, upper lung predominant. 18 x 9 mm subpleural nodule in the medial right upper lobe (image 44), grossly unchanged, with adjacent  radiation changes. Mixed density medial right lower lobe nodule measuring 5.6 x 2.6 cm (image 86), previously 4.4 x 2.6 cm when remeasured in a similar fashion. Associated 2.9 cm solid component posteriorly (image 87), previously 1.6 cm, suspicious for invasive adenocarcinoma. Additional 1.5 cm thin-walled cystic lesion in the central right lower lobe, grossly unchanged. SOFT TISSUES/BONES: No acute abnormality of the bones or soft tissues. UPPER ABDOMEN: Cirrhosis. Stable peribiliary cysts in the central left hepatic lobe, better evaluated on prior MR. Atherosclerotic calcification of the abdominal aorta. IMPRESSION: 1. Mixed density medial right lower lobe nodule measuring 5.6 x 2.6 cm, with progressive 2.9 cm solid component posteriorly, suspicious for invasive adenocarcinoma. 2. Stable medial right upper lobe subpleural nodule with radiation changes, corresponding to the patient's known treated primary bronchogenic carcinoma. 3. Additional stable ancillary findings as above. Electronically signed by: Pinkie Pebbles MD 08/02/2023 08:52 PM EDT RP Workstation: [id number]

## 2023-08-27 NOTE — Patient Instructions (Addendum)
 [facility] at [facility] Discharge Instructions   You were seen and examined today by Dr. Rogers.  He reviewed the results of your PET scan. It is showing some areas in the lung lighting up, but this is favored to be infection and not cancer.   We will see you back in 3 months. We will repeat lab work and a CT scan of your chest prior to this visit.   Return as scheduled.    Thank you for choosing [facility] at [facility] to provide your oncology and hematology care.  To afford each patient quality time with our provider, please arrive at least 15 minutes before your scheduled appointment time.   If you have a lab appointment with the Cancer Center please come in thru the Main Entrance and check in at the main information desk.  You need to re-schedule your appointment should you arrive 10 or more minutes late.  We strive to give you quality time with our providers, and arriving late affects you and other patients whose appointments are after yours.  Also, if you no show three or more times for appointments you may be dismissed from the clinic at the providers discretion.     Again, thank you for choosing [facility].  Our hope is that these requests will decrease the amount of time that you wait before being seen by our physicians.       _____________________________________________________________  Should you have questions after your visit to [facility], please contact our office at [phone] and follow the prompts.  Our office hours are 8:00 a.m. and 4:30 p.m. Monday - Friday.  Please note that voicemails left after 4:00 p.m. may not be returned until the following business day.  We are closed weekends and major holidays.  You do have access to a nurse 24-7, just call the main number to the clinic [phone] and do not press any options, hold on the line and a nurse will answer the phone.    For  prescription refill requests, have your pharmacy contact our office and allow 72 hours.    Due to Covid, you will need to wear a mask upon entering the hospital. If you do not have a mask, a mask will be given to you at the Main Entrance upon arrival. For doctor visits, patients may have 1 support person age 58 or older with them. For treatment visits, patients can not have anyone with them due to social distancing guidelines and our immunocompromised population.

## 2023-08-28 DIAGNOSIS — J441 Chronic obstructive pulmonary disease with (acute) exacerbation: Secondary | ICD-10-CM | POA: Diagnosis not present

## 2023-08-28 DIAGNOSIS — J189 Pneumonia, unspecified organism: Secondary | ICD-10-CM | POA: Diagnosis not present

## 2023-08-28 DIAGNOSIS — E039 Hypothyroidism, unspecified: Secondary | ICD-10-CM | POA: Diagnosis not present

## 2023-09-03 DIAGNOSIS — R4189 Other symptoms and signs involving cognitive functions and awareness: Secondary | ICD-10-CM | POA: Diagnosis not present

## 2023-09-03 DIAGNOSIS — J189 Pneumonia, unspecified organism: Secondary | ICD-10-CM | POA: Diagnosis not present

## 2023-09-03 DIAGNOSIS — G9341 Metabolic encephalopathy: Secondary | ICD-10-CM | POA: Diagnosis not present

## 2023-09-03 DIAGNOSIS — E039 Hypothyroidism, unspecified: Secondary | ICD-10-CM | POA: Diagnosis not present

## 2023-09-08 DIAGNOSIS — J101 Influenza due to other identified influenza virus with other respiratory manifestations: Secondary | ICD-10-CM | POA: Diagnosis not present

## 2023-09-08 DIAGNOSIS — J441 Chronic obstructive pulmonary disease with (acute) exacerbation: Secondary | ICD-10-CM | POA: Diagnosis not present

## 2023-09-18 DIAGNOSIS — E039 Hypothyroidism, unspecified: Secondary | ICD-10-CM | POA: Diagnosis not present

## 2023-09-24 ENCOUNTER — Telehealth: Payer: Self-pay | Admitting: Nurse Practitioner

## 2023-09-24 NOTE — Telephone Encounter (Signed)
 That would be totally up to the referring provider.  He is currently on thyroid  hormone replacement therapy, likely has absorption issue if he is taking it with all his other medications.

## 2023-09-24 NOTE — Telephone Encounter (Signed)
 Pt's son called and was curious if this referral was needed, as he had no idea the facility moved the appt. He states to me that he is on hospice and that he was calling the facility to see why he was even reviewed here.

## 2023-09-24 NOTE — Patient Instructions (Incomplete)

## 2023-09-25 ENCOUNTER — Ambulatory Visit: Admitting: Nurse Practitioner

## 2023-10-08 ENCOUNTER — Ambulatory Visit: Admitting: Urology

## 2023-10-09 DIAGNOSIS — J441 Chronic obstructive pulmonary disease with (acute) exacerbation: Secondary | ICD-10-CM | POA: Diagnosis not present

## 2023-10-09 DIAGNOSIS — J101 Influenza due to other identified influenza virus with other respiratory manifestations: Secondary | ICD-10-CM | POA: Diagnosis not present

## 2023-10-16 ENCOUNTER — Ambulatory Visit: Admitting: Urology

## 2023-11-07 ENCOUNTER — Encounter (HOSPITAL_COMMUNITY): Payer: Self-pay | Admitting: Oncology

## 2023-11-19 ENCOUNTER — Inpatient Hospital Stay

## 2023-11-19 ENCOUNTER — Ambulatory Visit (HOSPITAL_COMMUNITY)

## 2023-11-27 ENCOUNTER — Inpatient Hospital Stay: Admitting: Oncology

## 2023-11-28 DEATH — deceased

## 2023-12-03 ENCOUNTER — Telehealth: Payer: Self-pay | Admitting: Orthopedic Surgery

## 2023-12-03 NOTE — Telephone Encounter (Signed)
 The patient's son, Nathanael Krist lvm stating this patient has passed away.

## 2023-12-11 ENCOUNTER — Ambulatory Visit: Admitting: Nurse Practitioner

## 2024-02-12 ENCOUNTER — Ambulatory Visit: Admitting: Physician Assistant
